# Patient Record
Sex: Male | Born: 1940 | Race: Black or African American | Hispanic: No | Marital: Married | Smoking: Never smoker
Health system: Southern US, Community
[De-identification: ages and names within clinical notes are randomized; demographics above are authoritative.]

## PROBLEM LIST (undated history)

## (undated) DIAGNOSIS — M1990 Unspecified osteoarthritis, unspecified site: Secondary | ICD-10-CM

## (undated) DIAGNOSIS — C189 Malignant neoplasm of colon, unspecified: Secondary | ICD-10-CM

## (undated) DIAGNOSIS — B019 Varicella without complication: Secondary | ICD-10-CM

## (undated) DIAGNOSIS — Z87442 Personal history of urinary calculi: Secondary | ICD-10-CM

## (undated) DIAGNOSIS — D175 Benign lipomatous neoplasm of intra-abdominal organs: Secondary | ICD-10-CM

## (undated) DIAGNOSIS — N200 Calculus of kidney: Secondary | ICD-10-CM

## (undated) DIAGNOSIS — B029 Zoster without complications: Secondary | ICD-10-CM

## (undated) DIAGNOSIS — C801 Malignant (primary) neoplasm, unspecified: Secondary | ICD-10-CM

## (undated) DIAGNOSIS — I10 Essential (primary) hypertension: Secondary | ICD-10-CM

## (undated) DIAGNOSIS — J984 Other disorders of lung: Secondary | ICD-10-CM

## (undated) DIAGNOSIS — D126 Benign neoplasm of colon, unspecified: Secondary | ICD-10-CM

## (undated) DIAGNOSIS — E669 Obesity, unspecified: Secondary | ICD-10-CM

## (undated) HISTORY — PX: KIDNEY STONE SURGERY: SHX686

## (undated) HISTORY — PX: COLON SURGERY: SHX602

## (undated) MED FILL — Dexamethasone Sodium Phosphate Inj 100 MG/10ML: INTRAMUSCULAR | Qty: 1 | Status: AC

## (undated) MED FILL — Fluorouracil IV Soln 2.5 GM/50ML (50 MG/ML): INTRAVENOUS | Qty: 18 | Status: AC

## (undated) MED FILL — Fluorouracil IV Soln 5 GM/100ML (50 MG/ML): INTRAVENOUS | Qty: 100 | Status: AC

---

## 2011-05-20 LAB — URINALYSIS, COMPLETE
Bilirubin,UR: NEGATIVE
Glucose,UR: NEGATIVE mg/dL (ref 0–75)
Ketone: NEGATIVE
Leukocyte Esterase: NEGATIVE
Nitrite: NEGATIVE
Ph: 6 (ref 4.5–8.0)
Protein: NEGATIVE
RBC,UR: 1 /HPF (ref 0–5)
Specific Gravity: 1.021 (ref 1.003–1.030)
Squamous Epithelial: 1
WBC UR: 5 /HPF (ref 0–5)

## 2011-05-20 LAB — CBC
HCT: 37.4 % — ABNORMAL LOW (ref 40.0–52.0)
HGB: 12.4 g/dL — ABNORMAL LOW (ref 13.0–18.0)
MCH: 30.9 pg (ref 26.0–34.0)
MCV: 93 fL (ref 80–100)
RBC: 4.03 10*6/uL — ABNORMAL LOW (ref 4.40–5.90)
WBC: 11.3 10*3/uL — ABNORMAL HIGH (ref 3.8–10.6)

## 2011-05-20 LAB — COMPREHENSIVE METABOLIC PANEL
Albumin: 3.2 g/dL — ABNORMAL LOW (ref 3.4–5.0)
Anion Gap: 11 (ref 7–16)
BUN: 12 mg/dL (ref 7–18)
Bilirubin,Total: 0.3 mg/dL (ref 0.2–1.0)
Calcium, Total: 9.4 mg/dL (ref 8.5–10.1)
Creatinine: 0.91 mg/dL (ref 0.60–1.30)
Glucose: 116 mg/dL — ABNORMAL HIGH (ref 65–99)
Osmolality: 288 (ref 275–301)
Potassium: 4.4 mmol/L (ref 3.5–5.1)
Sodium: 144 mmol/L (ref 136–145)
Total Protein: 8.3 g/dL — ABNORMAL HIGH (ref 6.4–8.2)

## 2011-05-20 LAB — CK TOTAL AND CKMB (NOT AT ARMC): CK-MB: 2.8 ng/mL (ref 0.5–3.6)

## 2011-05-21 ENCOUNTER — Inpatient Hospital Stay: Payer: Self-pay | Admitting: Internal Medicine

## 2011-05-22 LAB — CBC WITH DIFFERENTIAL/PLATELET
Basophil #: 0 10*3/uL (ref 0.0–0.1)
Basophil %: 0.3 %
Eosinophil #: 0.2 10*3/uL (ref 0.0–0.7)
MCH: 30.8 pg (ref 26.0–34.0)
MCHC: 33.2 g/dL (ref 32.0–36.0)
Monocyte #: 0.6 10*3/uL (ref 0.0–0.7)
Neutrophil #: 6.5 10*3/uL (ref 1.4–6.5)
Neutrophil %: 70 %
Platelet: 327 10*3/uL (ref 150–440)
RDW: 12.9 % (ref 11.5–14.5)

## 2011-05-22 LAB — BASIC METABOLIC PANEL
Anion Gap: 5 — ABNORMAL LOW (ref 7–16)
BUN: 8 mg/dL (ref 7–18)
Calcium, Total: 9.3 mg/dL (ref 8.5–10.1)
Chloride: 102 mmol/L (ref 98–107)
Creatinine: 0.85 mg/dL (ref 0.60–1.30)
EGFR (Non-African Amer.): >60
Glucose: 87 mg/dL (ref 65–99)
Potassium: 4.1 mmol/L (ref 3.5–5.1)
Sodium: 138 mmol/L (ref 136–145)

## 2011-05-22 LAB — PROTIME-INR: INR: 1.1

## 2011-05-22 LAB — MAGNESIUM: Magnesium: 2.2 mg/dL

## 2011-05-26 LAB — EXPECTORATED SPUTUM ASSESSMENT W GRAM STAIN, RFLX TO RESP C

## 2011-05-26 LAB — CULTURE, BLOOD (SINGLE)

## 2011-06-23 ENCOUNTER — Ambulatory Visit: Payer: Self-pay | Admitting: Specialist

## 2013-09-22 ENCOUNTER — Ambulatory Visit: Payer: Self-pay | Admitting: Gastroenterology

## 2013-09-25 LAB — PATHOLOGY REPORT

## 2013-10-07 ENCOUNTER — Ambulatory Visit: Payer: Self-pay | Admitting: Surgery

## 2013-10-07 DIAGNOSIS — I10 Essential (primary) hypertension: Secondary | ICD-10-CM

## 2013-10-07 LAB — POTASSIUM: Potassium: 3.3 mmol/L — ABNORMAL LOW (ref 3.5–5.1)

## 2013-10-16 ENCOUNTER — Inpatient Hospital Stay: Payer: Self-pay | Admitting: Surgery

## 2013-10-17 DIAGNOSIS — Z85038 Personal history of other malignant neoplasm of large intestine: Secondary | ICD-10-CM | POA: Insufficient documentation

## 2013-10-17 HISTORY — PX: PARTIAL COLECTOMY: SHX5273

## 2013-10-17 LAB — CBC WITH DIFFERENTIAL/PLATELET
Basophil #: 0 10*3/uL (ref 0.0–0.1)
Basophil %: 0.2 %
EOS ABS: 0 10*3/uL (ref 0.0–0.7)
Eosinophil %: 0 %
HCT: 37.2 % — AB (ref 40.0–52.0)
HGB: 12.3 g/dL — ABNORMAL LOW (ref 13.0–18.0)
LYMPHS ABS: 1 10*3/uL (ref 1.0–3.6)
Lymphocyte %: 6.9 %
MCH: 30.6 pg (ref 26.0–34.0)
MCHC: 32.9 g/dL (ref 32.0–36.0)
MCV: 93 fL (ref 80–100)
Monocyte #: 0.8 x10 3/mm (ref 0.2–1.0)
Monocyte %: 5.9 %
NEUTROS PCT: 87 %
Neutrophil #: 12 10*3/uL — ABNORMAL HIGH (ref 1.4–6.5)
PLATELETS: 231 10*3/uL (ref 150–440)
RBC: 4 10*6/uL — ABNORMAL LOW (ref 4.40–5.90)
RDW: 13 % (ref 11.5–14.5)
WBC: 13.8 10*3/uL — ABNORMAL HIGH (ref 3.8–10.6)

## 2013-10-17 LAB — BASIC METABOLIC PANEL
Anion Gap: 5 — ABNORMAL LOW (ref 7–16)
BUN: 9 mg/dL (ref 7–18)
CALCIUM: 8.6 mg/dL (ref 8.5–10.1)
CHLORIDE: 102 mmol/L (ref 98–107)
Co2: 28 mmol/L (ref 21–32)
Creatinine: 0.94 mg/dL (ref 0.60–1.30)
EGFR (African American): >60
GLUCOSE: 122 mg/dL — AB (ref 65–99)
Osmolality: 270 (ref 275–301)
POTASSIUM: 4 mmol/L (ref 3.5–5.1)
Sodium: 135 mmol/L — ABNORMAL LOW (ref 136–145)

## 2013-10-20 LAB — PLATELET COUNT: Platelet: 240 10*3/uL (ref 150–440)

## 2013-10-20 LAB — PATHOLOGY REPORT

## 2013-11-03 ENCOUNTER — Ambulatory Visit: Payer: Self-pay | Admitting: Hematology and Oncology

## 2013-11-12 ENCOUNTER — Ambulatory Visit: Payer: Self-pay | Admitting: Hematology and Oncology

## 2014-02-05 DIAGNOSIS — M1712 Unilateral primary osteoarthritis, left knee: Secondary | ICD-10-CM | POA: Insufficient documentation

## 2014-04-27 ENCOUNTER — Ambulatory Visit: Payer: Self-pay | Admitting: Hematology and Oncology

## 2014-04-27 LAB — COMPREHENSIVE METABOLIC PANEL
ALBUMIN: 3.3 g/dL — AB (ref 3.4–5.0)
ANION GAP: 7 (ref 7–16)
AST: 25 U/L (ref 15–37)
Alkaline Phosphatase: 69 U/L
BILIRUBIN TOTAL: 0.6 mg/dL (ref 0.2–1.0)
BUN: 16 mg/dL (ref 7–18)
CO2: 31 mmol/L (ref 21–32)
CREATININE: 1.07 mg/dL (ref 0.60–1.30)
Calcium, Total: 9.4 mg/dL (ref 8.5–10.1)
Chloride: 104 mmol/L (ref 98–107)
EGFR (African American): >60
Glucose: 147 mg/dL — ABNORMAL HIGH (ref 65–99)
OSMOLALITY: 287 (ref 275–301)
Potassium: 3.7 mmol/L (ref 3.5–5.1)
SGPT (ALT): 31 U/L
SODIUM: 142 mmol/L (ref 136–145)
Total Protein: 7.6 g/dL (ref 6.4–8.2)

## 2014-04-27 LAB — CBC CANCER CENTER
Basophil #: 0 x10 3/mm (ref 0.0–0.1)
Basophil %: 0.7 %
EOS PCT: 1.1 %
Eosinophil #: 0.1 x10 3/mm (ref 0.0–0.7)
HCT: 40.6 % (ref 40.0–52.0)
HGB: 13.2 g/dL (ref 13.0–18.0)
Lymphocyte #: 2 x10 3/mm (ref 1.0–3.6)
Lymphocyte %: 27.7 %
MCH: 31.1 pg (ref 26.0–34.0)
MCHC: 32.6 g/dL (ref 32.0–36.0)
MCV: 95 fL (ref 80–100)
MONO ABS: 0.4 x10 3/mm (ref 0.2–1.0)
MONOS PCT: 5.9 %
NEUTROS PCT: 64.6 %
Neutrophil #: 4.7 x10 3/mm (ref 1.4–6.5)
Platelet: 208 x10 3/mm (ref 150–440)
RBC: 4.26 10*6/uL — ABNORMAL LOW (ref 4.40–5.90)
RDW: 13 % (ref 11.5–14.5)
WBC: 7.3 x10 3/mm (ref 3.8–10.6)

## 2014-04-28 LAB — CEA: CEA: 3.1 ng/mL (ref 0.0–4.7)

## 2014-05-15 ENCOUNTER — Ambulatory Visit: Payer: Self-pay | Admitting: Hematology and Oncology

## 2014-09-05 NOTE — Op Note (Signed)
PATIENT NAME:  Jared Gutierrez, Jared Gutierrez MR#:  [id number] DATE OF BIRTH:  [birth year]  DATE OF PROCEDURE:  10/16/2013  PREOPERATIVE DIAGNOSIS:  Large polyp of the splenic flexure of the colon.   POSTOPERATIVE DIAGNOSIS:  Large polyp of the transverse colon.   PROCEDURE PERFORMED: Transverse colectomy with mobilization of the splenic flexure.   SURGEON: Rochel Brome, M.D.   ANESTHESIA: General.   INDICATIONS: This 74 year old male recently had a colonoscopy, finding a fungating mass in the region of the splenic flexure. This was marked with tattoo. Surgery was recommended for definitive treatment.   DESCRIPTION OF PROCEDURE: The patient was placed on the operating table in the supine position under general anesthesia. The abdomen was prepared with ChloraPrep and draped in a sterile manner.   An upper abdominal midline incision was made from the xiphoid process to the umbilicus, carried down through subcutaneous tissues. Several small bleeding points were cauterized. The midline fascia was incised and the peritoneum was incised and the abdominal cavity was opened. Initial inspection revealed that there was no palpable mass within the liver. There was a tattoo mark in the transverse colon. Omentum was mobilized. There was a palpable mass in the left transverse colon, which was approximately 4 cm in dimension. Tattoo marks were seen both proximally and distally. This transverse colon was mobilized, separating portions of the omentum away from the colon. Extensive dissection was carried out to mobilize the splenic flexure and spent approximately 45 minutes mobilizing the splenic flexure as there seemed to be a number of adhesions and some redundancy of bowel, and after satisfactory mobilization, the  proximal and distal sites for resection were selected. Approximately 6 inches proximal and distal to the palpable mass, windows were created in the mesentery, and the mesenteric dissection was carried out with the  Harmonic scalpel removing a V-shaped portion of tissue. Dissection was carried out close to the fourth part of the duodenum, mobilizing the transverse mesentery. The middle colic artery and vein were ligated with 0 chromic and then divided with the Harmonic scalpel. The 2 segments of bowel were brought adjacent to one another; each segment was divided with electrocautery and the ends were held up with Allis clamps. The bowel with a cover on the distal end was placed on a side table. Next, the anastomosis was carried out with triangulation technique, first on the posterior wall, which was inverted, held with Allis's and placed with TA 30 stapler. Next, the anastomosis was completed with 2 more applications everting the remainder of the wall of the colon. As these staple line were placed, the anastomosis looked good and was widely patent.   On a side table, the bowel was opened and also placed a stitch on the distal end for the pathologist's orientation. There was a large fungating mass, at least 4 cm or more. There was no hard mass palpated. No adenopathy identified and the specimen was submitted in formalin for routine pathology.   Instruments, towels, gloves, gowns were all changed. Next, the mesenteric defect was closed with running 3-0 chromic. The operative area was examined. Pull suction was used to aspirate a small amount of blood in the left upper quadrant. Hemostasis subsequently appeared to be intact. The midline fascia was closed with interrupted 0 Maxon figure-of-eight sutures. The skin was closed with clips. Dressings were applied with paper tape, and the patient appeared to tolerate the procedure satisfactorily and was prepared for transfer to the recovery room.     ____________________________ Lenna Sciara. Rochel Brome, MD  jws:dmm D: 10/16/2013 14:30:55 ET T: 10/16/2013 19:33:53 ET JOB#: [id number]  cc: Loreli Dollar, MD, <Dictator> Loreli Dollar MD ELECTRONICALLY SIGNED 10/17/2013 21:31

## 2014-09-05 NOTE — Discharge Summary (Signed)
PATIENT NAME:  Jared Gutierrez, Jared Gutierrez MR#:  [id number] DATE OF BIRTH:  Jan 16, 1941  DATE OF ADMISSION:  10/16/2013 DATE OF DISCHARGE:  10/21/2013  HISTORY OF PRESENT ILLNESS: This 74 year old male had a recent screening colonoscopy with findings of a 3 cm fungating mass of the splenic flexure. Pathology demonstrated tubular adenoma with high-grade dysplasia.   Details of past medical history are recorded on the typed [facility]: The patient did have preop bowel preparation at home and came in through the outpatient surgery department. Did have a preoperative prophylactic antibiotic. Had a transverse colectomy. Postoperatively was treated with IV fluids, analgesics, prophylactic heparin.  Began initially a clear liquid diet and later advanced and tolerated his diet satisfactorily.   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 No cancer found in 14 regional mesenteric lymph nodes.   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   OPERATION: Transverse colectomy.   Wound care instructions given. Plans made for followup in the office. Also anticipate oncology consultation.   ____________________________ Lenna Sciara. Rochel Brome, MD jws:dd D: 11/04/2013 18:21:00 ET T: 11/04/2013 21:10:40 ET JOB#: [id number]  cc: Loreli Dollar, MD, <Dictator> Loreli Dollar MD ELECTRONICALLY SIGNED 11/06/2013 18:16

## 2014-09-06 NOTE — Consult Note (Signed)
Impression:  74yo BM w/ h/o pyorrhea admitted with a cavitary lung lesion.  He is generally healthy and has not been hospitalized recently.  He has very few risk factors for TB.   Most likely this is an anaerobic or mixed aerobic/anaerobic infection.   Malignancy is still a possibility, but less likely. Agree with ruling out TB.  Will await sputum AFB smears. Continue zosyn.  Will d/c azithromycin.  Atypical organisms would not likely cause cavitation. If he improves, would change him to augmentin. Will need follow up CT of the chest if 1-2 months to document improvement.  Electronic Signatures: Keighan Amezcua, Heinz Knuckles (MD)  (Signed on 07-Jan-13 12:47)  Authored  Last Updated: 07-Jan-13 12:47 by Stony Stegmann, Heinz Knuckles (MD)

## 2014-09-06 NOTE — H&P (Signed)
PATIENT NAME:  Jared Gutierrez, Jared Gutierrez MR#:  [id number] DATE OF BIRTH:  [birth year]  DATE OF ADMISSION:  05/21/2011  REFERRING PHYSICIAN: Dr. Rip Harbour PRIMARY CARE PHYSICIAN: None   PRESENTING COMPLAINT: Cough, wheezing, right side chest pain, chills.   HISTORY OF PRESENT ILLNESS: Jared Gutierrez is a pleasant 74 year old gentleman with history of pituitary gland surgery, nephrolithiasis, who presents with complaints of developing cough six days ago as well as chills followed by wheezing and then developed right-sided chest pain with cough and breathing. He reports that intermittently productive with brownish-yellow phlegm. He denies any hemoptysis. He denies any shortness of breath. He denies radiation of the pain. No history of nausea and vomiting. Denies any travels outside of the [place]. He was not in the [employer]. No exposure to homeless shelters or prisons. No exposure to anyone infected with tuberculosis as far as he knows. 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 Denies any tobacco or alcohol abuse.   PAST MEDICAL HISTORY: Nephrolithiasis status post surgical extraction.   PAST SURGICAL HISTORY:  1. Pituitary gland surgery.  2. Stone extraction as above.   ALLERGIES: No known drug allergies.   MEDICATIONS:  1. He has been taking Advil as needed for his cold symptoms.  2. Multivitamin daily.   FAMILY HISTORY: Mother died of breast cancer. Father had lung cancer, he was a smoker.   SOCIAL HISTORY: He lives in [place] with his wife. Denies any tobacco. He socially drinks. No drug use or IV drug use. He is a Dealer, has his own lawn care service.   REVIEW OF SYSTEMS: CONSTITUTIONAL: Denies any fevers. He endorses chills. No nausea or vomiting. EYES: No inflammation, glaucoma, cataracts. ENT: No ear pain, epistaxis, discharge. RESPIRATORY: As per history of present illness. CARDIOVASCULAR: Chest pain on the right side as per history of present illness. No orthopnea. Denies any changes in  lower extremity swelling. No palpitations or syncope. GASTROINTESTINAL: No nausea, vomiting, diarrhea, abdominal pain, hematemesis, or melena. Denies any dyspepsia or dysphagia. GENITOURINARY: No dysuria or hematuria. ENDO: No polyuria or polydipsia. HEMATOLOGIC: No easy bleeding. SKIN: No rash or ulcers. MUSCULOSKELETAL: He has occasional knee pain bilaterally. NEUROLOGIC: No one-sided weakness or numbness. PSYCH: Denies any depression or suicidal ideation.   PHYSICAL EXAMINATION:  VITAL SIGNS: Temperature 98.2, pulse 87, respiratory rate 20, blood pressure 177/77, sating at 96% on room air.   GENERAL: Lying in bed in no apparent distress.   HEENT: Normocephalic, atraumatic. Pupils equal, symmetric. Nasal cannula in place. He has poor dentition with dental caries on the posterior molars. No ulcers. No oral thrush.   NECK: Soft and supple. No adenopathy or JVP.   CARDIOVASCULAR: Non-tachy. No murmurs, rubs, or gallops.   LUNGS: Basilar crackles. No use of accessory muscles or increased respiratory effort. He has distant breath sounds.   ABDOMEN: Obese. Positive bowel sounds. No mass appreciated.   EXTREMITIES: Trace edema bilaterally. Dorsal pedis pulses intact.   MUSCULOSKELETAL: No joint effusion.   SKIN: No ulcers or lesions.   NEUROLOGIC: No asterixis. Symmetrical strength. No focal deficits.   PSYCH: He is alert and oriented. Patient is cooperative.   LABORATORY, DIAGNOSTIC AND RADIOLOGICAL DATA: CT chest for PE with contrast shows no evidence of PE. There is trace pleural effusions. There is a cavitary lesion in the right lower lobe measuring 5.8 x 4.7 cm with an air-fluid level in the cavitary region. Infectious, inflammatory or malignant changes could cause this appearance. There is bilateral lower lobe areas  of presumed atelectasis or minimal infiltrate. Bullous changes are seen in the right middle lobe inferiorly and anteriorly in the right lung apex anteriorly otherwise discrete  pulmonary mass is not seen. No focal endobronchial lesion. Urinalysis with specific gravity of 1.021, pH 6, RBC 1 per high-power field, WBC 5 per high-power field. Chest x-ray actually read as no acute findings. CK 163, MB 2.8. Troponin less than 0.02. D-dimer 1.5, glucose 116, BUN 12, creatinine 0.91, sodium 144, potassium 4.4, chloride 104, carbon dioxide 29, calcium 9.4. LFTs within normal limits. Albumin 3.2. WBC 11.3, hemoglobin 12.4, hematocrit 37.4, platelets 304, MCV 93. EKG with normal sinus rate of 88 and some PVCs. No ST changes.   ASSESSMENT AND PLAN: Mr. Corpening is a pleasant 74 year old gentleman with history of nephrolithiasis and pituitary gland surgery who presents with cough, right side pleuritic chest pain, wheezing and chills.  1. Right lower lobe cavitary lesion more likely aspiration pneumonia with dental caries. Patient is not high risk but will send HIV and rule out tuberculosis. He is a nonsmoker and CT does not report any primary mass lesion. His blood cultures have been sent. Will send sputum culture. Start on clindamycin for now. Continue oxygen and SVNs as needed. Get speech evaluation. Will also obtain ID and pulmonary consultation.  2. Hypertension. No prior treatment or diagnosis, possibly pain related. Will continue to follow for now.  3. Prophylaxis on TEDs and SCDs and Protonix.   TIME SPENT: Approximately 45 minutes spent on patient care.    ____________________________ Rita Ohara, MD ap:cms D: 05/21/2011 00:57:07 ET T: 05/21/2011 10:42:52 ET JOB#: [id number]  cc: Jared Gutierrez Swallows, MD, <Dictator> Rita Ohara MD ELECTRONICALLY SIGNED 06/03/2011 2:26

## 2014-09-06 NOTE — Discharge Summary (Signed)
PATIENT NAME:  Jared Gutierrez, BEVILL MR#:  [id number] DATE OF BIRTH:  1940/08/09  DATE OF ADMISSION:  05/21/2011 DATE OF DISCHARGE:  05/25/2011  DISCHARGE DIAGNOSES:   1. Right lower lobe cavitary lesion.  2. Hypertension.  3. Nephrolithiasis.   DISCHARGE MEDICATIONS: Augmentin 875 mg twice a day.   REASON FOR ADMISSION: This is a 74 year old male who comes in with complaints of cough for six days, right-sided chest pain, and shortness of breath. Chest x-ray showed him to have a right cavitated lesion. He denies any exposure to tuberculosis.   HOSPITAL COURSE:  Right lower lobe cavitary lesion. CT showed a cavitary lesion. He was placed on respiratory isolation. AFB smears were ordered. ID was consulted. He was started on antibiotics awaiting AFB smears. He eventually had three AFB smears that were negative. He was changed over from IV Zosyn to p.o. Augmentin. He will complete a 30 day course and follow-up with Dr. Clayborn Bigness for repeat CT scan.   TIME SPENT: Time spent on discharge was 32 minutes.  ____________________________ Baxter Hire, MD jdj:rbg D: 05/25/2011 11:30:16 ET T: 05/25/2011 16:37:56 ET JOB#: [id number]  cc: Baxter Hire, MD, <Dictator> Woodland Blocker, MD Baxter Hire MD ELECTRONICALLY SIGNED 05/28/2011 20:00

## 2014-09-06 NOTE — Consult Note (Signed)
PATIENT NAME:  Jared Gutierrez, Jared Gutierrez MR#:  [id number] DATE OF BIRTH:  1940-11-18  DATE OF CONSULTATION:  05/22/2011  REFERRING PHYSICIAN:  Dr. Leslye Peer  CONSULTING PHYSICIAN:  Heinz Knuckles. Herberta Pickron, MD  REASON FOR CONSULTATION: Cavitary lung lesion.   HISTORY OF PRESENT ILLNESS: Patient is a 74 year old black man with a past history significant for pituitary gland surgery and pyorrhea who was admitted on 05/21/2011 with approximately one-week history of cough, sputum production, low-grade fever and chills. He developed chest pains on the right side in the day or so prior to admission. He has not had significant weight loss. He has not had night sweats. He does not have any hemoptysis. He denies any known tuberculosis contacts. He has never been in prison and has never been homeless. Patient was hospital his chest x-ray was negative, however, CT scan was obtained due to the possible risk of a pulmonary embolism. No embolism was seen but he does have a right lower lobe cavitary lesion.   ALLERGIES: None.   PAST MEDICAL HISTORY:  1. Pituitary surgery.  2. Nephrolithiasis.  3. Pyorrhea.  4. He has not had any recent dental work.   SOCIAL HISTORY: Patient lives with his wife. He is not a smoker. Occasional alcohol use. No history of injecting drug use. He has not had any travel outside of the [employer]. recently.   FAMILY HISTORY: Positive for breast cancer, lung cancer and chronic obstructive pulmonary disease.    REVIEW OF SYSTEMS: GENERAL: Some low-grade fevers. Positive chills. No night sweats. No weight loss. HEENT: No headache. Minor sinus congestion and nasal congestion. No sore throat. No hoarseness. NECK: No stiffness. No swollen glands. RESPIRATORY: Positive cough with occasional productive sputum. Some shortness of breath and wheezing. No hemoptysis. CARDIAC: Chest pains on the right side. They are not related to exertion. No peripheral edema. GASTROINTESTINAL: No nausea, no vomiting, no  abdominal pain, no change in his bowels. GENITOURINARY: No change in his urine other than some urgency when the chest pain began. He has had no dysuria. No hematuria. MUSCULOSKELETAL: He has some generalized muscle aches but no frank joint swelling or inflammation. SKIN: No rashes. NEUROLOGIC: No focal weaknesses. PSYCHIATRIC: No complaints. All other systems are negative.   PHYSICAL EXAMINATION:  VITAL SIGNS: T-max 99.3, pulse 63, blood pressure 126/61, 94% on room air.   GENERAL: 74 year old black man in no acute distress.   HEENT: Normocephalic, atraumatic. Pupils equal, reactive to light. Extraocular motion intact. Sclerae, conjunctivae, and lids are without evidence for emboli or petechiae. Oropharynx shows no erythema or exudate. Teeth and gums are in fair condition. There is no evidence for gingival infection at this time.   NECK: Supple. Full range of motion. Midline trachea. No lymphadenopathy. No thyromegaly.   CHEST: Clear to auscultation bilaterally with good air movement. No focal consolidation.   CARDIAC: Regular rate and rhythm without murmur, rub, or gallop. No peripheral cyanosis, clubbing or edema.   ABDOMEN: Soft, nontender, nondistended. No hepatosplenomegaly. No hernias noted.   EXTREMITIES: No evidence for tenosynovitis.   SKIN: No rashes. No stigmata of endocarditis, specifically no Janeway lesions nor Osler nodes.   NEUROLOGIC: The patient was awake and interactive, moving all four extremities.   PSYCHIATRIC: Mood and affect appeared normal.   LABORATORY, DIAGNOSTIC, AND RADIOLOGICAL DATA: BUN 8, creatinine 0.85, bicarbonate 31, anion gap 5e. LFTs were unremarkable except for total protein 8.2, albumin 3.2. TSH 2.77. White count 9.2, hemoglobin 11.8, platelet count 327, ANC 6.5. White count on admission  11.3. Blood cultures from admission show no growth. AFB sputums are pending x2 and a routine sputum culture is pending x1. Urinalysis was unremarkable. Chest x-ray  showed no acute cardiopulmonary disease. A CT scan of the chest showed no evidence for pulmonary emboli. There were multifocal areas of atelectasis or infiltrate, a cavitary mass in the right lower lobe.   IMPRESSION: 74 year old black man with a history of pyorrhea admitted with cavitary lung lesion.   RECOMMENDATIONS:  1. He is generally healthy and has not been hospitalized recently. He has very few risk factors for tuberculosis. Most likely this is an anaerobic or mixed aerobic anaerobic infection. Malignancy is still a possibility but less likely.  2. Agree with ruling out tuberculosis. Will await sputum AFB smears.  3. Will continue Zosyn. Will discontinue the azithromycin. Atypical organisms would not likely cause cavitation.  4. if he improves would change him to Augmentin.  5. He will need a CT scan of the chest in 1 to 2 months to document clearance. If he does not show clearance then further evaluation with bronchoscopy or CT-guided biopsy may be necessary.   This is a moderately complex infectious disease consult. Thank you very much for involving me in Mr. Furney care.  ____________________________ Heinz Knuckles. Horice Carrero, MD meb:cms D: 05/22/2011 12:54:06 ET T: 05/22/2011 14:00:09 ET JOB#: [id number]  cc: Heinz Knuckles. Arlita Buffkin, MD, <Dictator> Kaelem Brach E Arna Luis MD ELECTRONICALLY SIGNED 05/23/2011 8:44

## 2014-10-01 ENCOUNTER — Encounter: Payer: Self-pay | Admitting: *Deleted

## 2014-10-02 ENCOUNTER — Ambulatory Visit
Admission: RE | Admit: 2014-10-02 | Discharge: 2014-10-02 | Disposition: A | Discharge status: Home / Self Care | Payer: Medicare Other | Source: Ambulatory Visit | Attending: Gastroenterology | Admitting: Gastroenterology

## 2014-10-02 ENCOUNTER — Encounter
Admission: RE | Disposition: A | Discharge status: Home / Self Care | Payer: Self-pay | Source: Ambulatory Visit | Attending: Gastroenterology

## 2014-10-02 ENCOUNTER — Ambulatory Visit: Payer: Medicare Other | Admitting: Anesthesiology

## 2014-10-02 DIAGNOSIS — Z87442 Personal history of urinary calculi: Secondary | ICD-10-CM | POA: Insufficient documentation

## 2014-10-02 DIAGNOSIS — Z79899 Other long term (current) drug therapy: Secondary | ICD-10-CM | POA: Diagnosis not present

## 2014-10-02 DIAGNOSIS — I10 Essential (primary) hypertension: Secondary | ICD-10-CM | POA: Insufficient documentation

## 2014-10-02 DIAGNOSIS — D120 Benign neoplasm of cecum: Secondary | ICD-10-CM | POA: Diagnosis not present

## 2014-10-02 DIAGNOSIS — D123 Benign neoplasm of transverse colon: Secondary | ICD-10-CM | POA: Diagnosis not present

## 2014-10-02 DIAGNOSIS — Z85038 Personal history of other malignant neoplasm of large intestine: Secondary | ICD-10-CM | POA: Diagnosis present

## 2014-10-02 DIAGNOSIS — M1990 Unspecified osteoarthritis, unspecified site: Secondary | ICD-10-CM | POA: Diagnosis not present

## 2014-10-02 DIAGNOSIS — D175 Benign lipomatous neoplasm of intra-abdominal organs: Secondary | ICD-10-CM | POA: Diagnosis not present

## 2014-10-02 DIAGNOSIS — Z08 Encounter for follow-up examination after completed treatment for malignant neoplasm: Secondary | ICD-10-CM | POA: Diagnosis not present

## 2014-10-02 DIAGNOSIS — Z7982 Long term (current) use of aspirin: Secondary | ICD-10-CM | POA: Diagnosis not present

## 2014-10-02 HISTORY — DX: Other disorders of lung: J98.4

## 2014-10-02 HISTORY — DX: Essential (primary) hypertension: I10

## 2014-10-02 HISTORY — PX: COLONOSCOPY: SHX5424

## 2014-10-02 HISTORY — DX: Calculus of kidney: N20.0

## 2014-10-02 HISTORY — DX: Malignant neoplasm of colon, unspecified: C18.9

## 2014-10-02 HISTORY — DX: Malignant (primary) neoplasm, unspecified: C80.1

## 2014-10-02 HISTORY — DX: Unspecified osteoarthritis, unspecified site: M19.90

## 2014-10-02 SURGERY — COLONOSCOPY
Anesthesia: General

## 2014-10-02 MED ORDER — FENTANYL CITRATE (PF) 100 MCG/2ML IJ SOLN
INTRAMUSCULAR | Status: DC | PRN
  Administered 2014-10-02: 50 ug via INTRAVENOUS

## 2014-10-02 MED ORDER — PROPOFOL 10 MG/ML IV BOLUS
INTRAVENOUS | Status: DC | PRN
  Administered 2014-10-02: 20 mg via INTRAVENOUS

## 2014-10-02 MED ORDER — PROPOFOL INFUSION 10 MG/ML OPTIME
INTRAVENOUS | Status: DC | PRN
  Administered 2014-10-02: 100 ug/kg/min via INTRAVENOUS

## 2014-10-02 MED ORDER — SODIUM CHLORIDE 0.9 % IV SOLN
INTRAVENOUS | Status: DC
  Administered 2014-10-02: 1000 mL via INTRAVENOUS
  Administered 2014-10-02: 10:00:00 via INTRAVENOUS

## 2014-10-02 NOTE — Discharge Instructions (Signed)

## 2014-10-02 NOTE — H&P (Signed)
  Primary Care Physician:  Tracie Harrier, MD  Pre-Procedure History & Physical: HPI:  Jared Gutierrez is a 74 y.o. male is here for an colonoscopy.   Past Medical History  Diagnosis Date  . Hypertension   . Cancer   . Cavitary lesion of lung   . Nephrolithiasis   . Colon cancer   . Arthritis     History reviewed. No pertinent past surgical history.  Prior to Admission medications   Medication Sig Start Date End Date Taking? Authorizing Provider  aspirin 81 MG tablet Take 81 mg by mouth daily.   Yes Historical Provider, MD  glucosamine-chondroitin 500-400 MG tablet Take 1 tablet by mouth daily.   Yes Historical Provider, MD  losartan-hydrochlorothiazide (HYZAAR) 50-12.5 MG per tablet Take 1 tablet by mouth daily.   Yes Historical Provider, MD  oxybutynin (DITROPAN) 5 MG tablet Take 5 mg by mouth 3 (three) times daily.   Yes Historical Provider, MD  Probiotic Product (PROBIOTIC DAILY PO) Take 1 tablet by mouth daily.   Yes Historical Provider, MD    Allergies as of 09/04/2014  . (Not on File)    History reviewed. No pertinent family history.  History   Social History  . Marital Status: Married    Spouse Name: N/A  . Number of Children: N/A  . Years of Education: N/A   Occupational History  . Not on file.   Social History Main Topics  . Smoking status: Never Smoker   . Smokeless tobacco: Never Used  . Alcohol Use: No  . Drug Use: No  . Sexual Activity: Not on file   Other Topics Concern  . Not on file   Social History Narrative  . No narrative on file     Physical Exam: BP 146/65 mmHg  Pulse 59  Temp(Src) 96.8 F (36 C) (Tympanic)  Resp 18  Ht 5\' 9"  (1.753 m)  Wt 260 lb (117.935 kg)  BMI 38.38 kg/m2  SpO2 100% General:   Alert,  pleasant and cooperative in NAD Head:  Normocephalic and atraumatic. Neck:  Supple; no masses or thyromegaly. Lungs:  Clear throughout to auscultation.    Heart:  Regular rate and rhythm. Abdomen:  Soft, nontender and  nondistended. Normal bowel sounds, without guarding, and without rebound.   Neurologic:  Alert and  oriented x4;  grossly normal neurologically.  Impression/Plan: Jared Gutierrez is here for an colonoscopy to be performed for colon cancer surveillance  Risks, benefits, limitations, and alternatives regarding  colonoscopy have been reviewed with the patient.  Questions have been answered.  All parties agreeable.   Josefine Class, MD  10/02/2014, 9:59 AM

## 2014-10-02 NOTE — Anesthesia Postprocedure Evaluation (Signed)
  Anesthesia Post-op Note  Patient: Jared Gutierrez  Procedure(s) Performed: Procedure(s): COLONOSCOPY (N/A)  Anesthesia type:General  Patient location: PACU  Post pain: Pain level controlled  Post assessment: Post-op Vital signs reviewed, Patient's Cardiovascular Status Stable, Respiratory Function Stable, Patent Airway and No signs of Nausea or vomiting  Post vital signs: Reviewed and stable  Last Vitals:  Filed Vitals:   10/02/14 1110  BP: 134/74  Pulse: 54  Temp:   Resp: 11    Level of consciousness: awake, alert  and patient cooperative  Complications: No apparent anesthesia complications

## 2014-10-02 NOTE — Transfer of Care (Signed)
Immediate Anesthesia Transfer of Care Note  Patient: Jared Gutierrez  Procedure(s) Performed: Procedure(s): COLONOSCOPY (N/A)  Patient Location: PACU  Anesthesia Type:General  Level of Consciousness: awake, alert  and oriented  Airway & Oxygen Therapy: Patient Spontanous Breathing and Patient connected to nasal cannula oxygen  Post-op Assessment: Report given to RN  Post vital signs: Reviewed and stable  Last Vitals:  Filed Vitals:   10/02/14 1041  BP: 116/62  Pulse: 52  Temp: 36.2 C  Resp:     Complications: No apparent anesthesia complications

## 2014-10-02 NOTE — Anesthesia Preprocedure Evaluation (Signed)
Anesthesia Evaluation  Patient identified by MRN, date of birth, ID band Patient awake    Reviewed: Allergy & Precautions, NPO status , Patient's Chart, lab work & pertinent test results  Airway Mallampati: III       Dental  (+) Partial Lower, Partial Upper, Chipped,    Pulmonary  Cavitary lesion of the lung         Cardiovascular hypertension, Pt. on medications     Neuro/Psych    GI/Hepatic   Endo/Other    Renal/GU Renal disease (Stones)     Musculoskeletal   Abdominal   Peds  Hematology   Anesthesia Other Findings   Reproductive/Obstetrics                             Anesthesia Physical Anesthesia Plan  ASA: III  Anesthesia Plan: General   Post-op Pain Management:    Induction: Intravenous  Airway Management Planned: Nasal Cannula  Additional Equipment:   Intra-op Plan:   Post-operative Plan:   Informed Consent: I have reviewed the patients History and Physical, chart, labs and discussed the procedure including the risks, benefits and alternatives for the proposed anesthesia with the patient or authorized representative who has indicated his/her understanding and acceptance.     Plan Discussed with:   Anesthesia Plan Comments:         Anesthesia Quick Evaluation

## 2014-10-02 NOTE — Op Note (Signed)
[facility] Gastroenterology Patient Name: Jared Gutierrez Procedure Date: 10/02/2014 10:01 AM MRN: [id number] Account #: [id number] Date of Birth: 1941/04/30 Admit Type: Outpatient Age: 74 Room: [facility] ENDO ROOM 1 Gender: Male Note Status: Finalized Procedure:         Colonoscopy Indications:       High risk colon cancer surveillance: Personal history of                     colon cancer 09/2013, Last colonoscopy 1 year ago Patient Profile:   This is a 74 year old male. Providers:         Gerrit Heck. Rayann Heman, MD Referring MD:      Tracie Harrier, MD (Referring MD) Medicines:         Propofol per Anesthesia Complications:     No immediate complications. Procedure:         Pre-Anesthesia Assessment:                    - Prior to the procedure, a History and Physical was                     performed, and patient medications and allergies were                     reviewed. The patient is competent. The risks and benefits                     of the procedure and the sedation options and risks were                     discussed with the patient. All questions were answered                     and informed consent was obtained. Patient identification                     and proposed procedure were verified by the physician and                     the nurse in the pre-procedure area. Mental Status                     Examination: alert and oriented. Airway Examination:                     normal oropharyngeal airway and neck mobility. Respiratory                     Examination: clear to auscultation. CV Examination: RRR,                     no murmurs, no S3 or S4. Prophylactic Antibiotics: The                     patient does not require prophylactic antibiotics. Prior                     Anticoagulants: The patient has taken aspirin, last dose                     was 1 day prior to procedure. ASA Grade Assessment: II - A  patient with mild systemic disease.  After reviewing the                     risks and benefits, the patient was deemed in satisfactory                     condition to undergo the procedure. The anesthesia plan                     was to use monitored anesthesia care (MAC). Immediately                     prior to administration of medications, the patient was                     re-assessed for adequacy to receive sedatives. The heart                     rate, respiratory rate, oxygen saturations, blood                     pressure, adequacy of pulmonary ventilation, and response                     to care were monitored throughout the procedure. The                     physical status of the patient was re-assessed after the                     procedure.                    After obtaining informed consent, the colonoscope was                     passed under direct vision. Throughout the procedure, the                     patient's blood pressure, pulse, and oxygen saturations                     were monitored continuously. The Colonoscope was                     introduced through the anus and advanced to the the cecum,                     identified by appendiceal orifice and ileocecal valve. The                     colonoscopy was performed without difficulty. The patient                     tolerated the procedure well. The quality of the bowel                     preparation was excellent. Findings:      The perianal and digital rectal examinations were normal.      A 8 mm polyp was found in the cecum. The polyp was sessile. The polyp       was removed with a hot snare. Resection and retrieval were complete.      A 3 mm polyp was found in the cecum. The polyp was sessile. The polyp  was removed with a jumbo cold forceps. Resection and retrieval were       complete.      A 3 mm polyp was found in the transverse colon. The polyp was sessile.       The polyp was removed with a jumbo cold forceps. Resection and  retrieval       were complete.      A 3 mm polyp was found in the transverse colon. The polyp was sessile.       The polyp was removed with a jumbo cold forceps. The polyp was removed       with a cold snare. Resection and retrieval were complete.      There was a medium-sized lipoma, 10 mm in diameter, in the transverse       colon.      The exam was otherwise without abnormality. Impression:        - One 8 mm polyp in the cecum. Resected and retrieved.                    - One 3 mm polyp in the cecum. Resected and retrieved.                    - One 3 mm polyp in the transverse colon. Resected and                     retrieved.                    - One 3 mm polyp in the transverse colon. Resected and                     retrieved.                    - Medium-sized lipoma in the transverse colon.                    - The examination was otherwise normal. Recommendation:    - Observe patient in GI recovery unit.                    - Continue present medications.                    - Await pathology results.                    - Repeat colonoscopy in 2 years for surveillance.                    - Return to referring physician.                    - The findings and recommendations were discussed with the                     patient.                    - The findings and recommendations were discussed with the                     patient's family. Procedure Code(s): --- Professional ---                    [phone], Colonoscopy, flexible; with removal of tumor(s),  polyp(s), or other lesion(s) by snare technique                    [id number], 59, Colonoscopy, flexible; with biopsy, single or                     multiple CPT copyright 2014 American Medical Association. All rights reserved. The codes documented in this report are preliminary and upon coder review may  be revised to meet current compliance requirements. Mellody Life, MD 10/02/2014 10:38:06 AM This report has  been signed electronically. Number of Addenda: 0 Note Initiated On: 10/02/2014 10:01 AM Scope Withdrawal Time: 0 hours 16 minutes 41 seconds  Total Procedure Duration: 0 hours 23 minutes 28 seconds       [facility]

## 2014-10-05 LAB — SURGICAL PATHOLOGY

## 2014-10-06 ENCOUNTER — Encounter: Payer: Self-pay | Admitting: Gastroenterology

## 2014-11-04 ENCOUNTER — Other Ambulatory Visit: Payer: Medicare Other

## 2014-11-04 ENCOUNTER — Ambulatory Visit: Payer: Medicare Other | Admitting: Hematology and Oncology

## 2014-11-09 ENCOUNTER — Other Ambulatory Visit: Payer: Self-pay

## 2014-11-09 DIAGNOSIS — C189 Malignant neoplasm of colon, unspecified: Secondary | ICD-10-CM

## 2014-11-11 ENCOUNTER — Inpatient Hospital Stay: Payer: Medicare Other | Attending: Hematology and Oncology

## 2014-11-11 ENCOUNTER — Inpatient Hospital Stay (HOSPITAL_BASED_OUTPATIENT_CLINIC_OR_DEPARTMENT_OTHER): Payer: Medicare Other | Admitting: Hematology and Oncology

## 2014-11-11 VITALS — BP 131/61 | HR 68 | Temp 98.1°F | Wt 267.0 lb

## 2014-11-11 DIAGNOSIS — D123 Benign neoplasm of transverse colon: Secondary | ICD-10-CM | POA: Diagnosis not present

## 2014-11-11 DIAGNOSIS — I10 Essential (primary) hypertension: Secondary | ICD-10-CM | POA: Insufficient documentation

## 2014-11-11 DIAGNOSIS — C184 Malignant neoplasm of transverse colon: Secondary | ICD-10-CM | POA: Diagnosis not present

## 2014-11-11 DIAGNOSIS — C189 Malignant neoplasm of colon, unspecified: Secondary | ICD-10-CM

## 2014-11-11 DIAGNOSIS — Z79899 Other long term (current) drug therapy: Secondary | ICD-10-CM

## 2014-11-11 DIAGNOSIS — Z7982 Long term (current) use of aspirin: Secondary | ICD-10-CM | POA: Insufficient documentation

## 2014-11-11 DIAGNOSIS — Z9049 Acquired absence of other specified parts of digestive tract: Secondary | ICD-10-CM

## 2014-11-11 DIAGNOSIS — D120 Benign neoplasm of cecum: Secondary | ICD-10-CM | POA: Insufficient documentation

## 2014-11-11 LAB — COMPREHENSIVE METABOLIC PANEL
ALT: 26 U/L (ref 17–63)
AST: 44 U/L — ABNORMAL HIGH (ref 15–41)
Albumin: 4 g/dL (ref 3.5–5.0)
Alkaline Phosphatase: 61 U/L (ref 38–126)
Anion gap: 8 (ref 5–15)
BUN: 17 mg/dL (ref 6–20)
CO2: 28 mmol/L (ref 22–32)
Calcium: 9.4 mg/dL (ref 8.9–10.3)
Chloride: 101 mmol/L (ref 101–111)
Creatinine, Ser: 0.94 mg/dL (ref 0.61–1.24)
GFR calc Af Amer: >60 mL/min (ref >60)
GFR calc non Af Amer: >60 mL/min (ref >60)
Glucose, Bld: 107 mg/dL — ABNORMAL HIGH (ref 65–99)
Potassium: 3.7 mmol/L (ref 3.5–5.1)
Sodium: 137 mmol/L (ref 135–145)
Total Bilirubin: 0.9 mg/dL (ref 0.3–1.2)
Total Protein: 7.4 g/dL (ref 6.5–8.1)

## 2014-11-11 LAB — CBC WITH DIFFERENTIAL/PLATELET
Basophils Absolute: 0.2 10*3/uL — ABNORMAL HIGH (ref 0–0.1)
Basophils Relative: 2 %
Eosinophils Absolute: 0.1 10*3/uL (ref 0–0.7)
Eosinophils Relative: 1 %
HCT: 39.7 % — ABNORMAL LOW (ref 40.0–52.0)
Hemoglobin: 13.4 g/dL (ref 13.0–18.0)
Lymphocytes Relative: 24 %
Lymphs Abs: 2 10*3/uL (ref 1.0–3.6)
MCH: 31 pg (ref 26.0–34.0)
MCHC: 33.8 g/dL (ref 32.0–36.0)
MCV: 91.9 fL (ref 80.0–100.0)
Monocytes Absolute: 0.7 10*3/uL (ref 0.2–1.0)
Monocytes Relative: 8 %
Neutro Abs: 5.4 10*3/uL (ref 1.4–6.5)
Neutrophils Relative %: 65 %
Platelets: 212 10*3/uL (ref 150–440)
RBC: 4.32 MIL/uL — ABNORMAL LOW (ref 4.40–5.90)
RDW: 12.8 % (ref 11.5–14.5)
WBC: 8.4 10*3/uL (ref 3.8–10.6)

## 2014-11-11 NOTE — Progress Notes (Signed)
Ongoing eval of colon cancer. Had a colonoscopy in may 2016. Found polyps. Will repeat colonoscopy in 2 years. 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 Appetite is good. Bowels/bladder normal.No abd pain. No blood is seen in stools. Energy is rated as fair.

## 2014-11-11 NOTE — Progress Notes (Signed)
[facility] day:  11/11/2014  Chief Complaint: Jared Gutierrez is a 75 y.o. male with stage I colon cancer who is seen for reassessment.  HPI: The patient states that his colon cancer was diagnosed with his first screening colonoscopy By Dr. Arther Dames.  He was noted to have a 3 cm fungating mass at the splenic flexure. He denied any symptoms including pain, hematochezia or melena.  He underwent a transverse colectomy on 10/17/2013.  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 Tumor size was 1 cm (invasive component) and moderately differentiated. Tumor extended into the submucosa. Margins were negative. There was lymphovascular invasion. 14 lymph nodes were negative. Pathologic stage was T1 N0.  He underwent a follow-up colonoscopy on 10/02/2014.  He was noted to have a 3 mm and a 8 mm polyp in the cecum, two 3 mm polyps in the transverse colon, and a medium-sized lipoma in the transverse colon. Polyps were resected.  Pathology of the cecal and transverse colon polyps revealed tubular adenomas negative for high-grade dysplasia and malignancy. Plan is for follow-up colonoscopy in 2 years for surveillance.  Symptomatically, he states he feels "pretty good". He has some knee problems. He denies any melena or hematochezia.  Past Medical History  Diagnosis Date  . Hypertension   . Cancer   . Cavitary lesion of lung   . Nephrolithiasis   . Colon cancer   . Arthritis     Past Surgical History  Procedure Laterality Date  . Colonoscopy N/A 10/02/2014    Procedure: COLONOSCOPY;  Surgeon: Josefine Class, MD;  Location: [facility] ENDOSCOPY;  Service: Endoscopy;  Laterality: N/A;    No family history on file.  Social History:  reports that he has never smoked. He has never used smokeless tobacco. He reports that he does not drink alcohol or use illicit drugs.  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  The patient is accompanied by his wife today.  Allergies: No Known Allergies  Current Medications: Current Outpatient Prescriptions  Medication Sig Dispense Refill  . aspirin 81 MG tablet Take 81 mg by mouth daily.    [name] glucosamine-chondroitin 500-400 MG tablet Take 1 tablet by mouth daily.    [name] losartan-hydrochlorothiazide (HYZAAR) 50-12.5 MG per tablet Take 1 tablet by mouth daily.    [name] oxybutynin (DITROPAN) 5 MG tablet Take 5 mg by mouth 3 (three) times daily.    . Probiotic Product (PROBIOTIC DAILY PO) Take 1 tablet by mouth daily.     No current facility-administered medications for this visit.    Review of Systems:  GENERAL:  Feels pretty good.  Active.  No fevers or sweats.  Weight gain since surgery. PERFORMANCE STATUS (ECOG):  0 HEENT:  No visual changes, runny nose, sore throat, mouth sores or tenderness. Lungs: No shortness of breath or cough.  No hemoptysis. Cardiac:  No chest pain, palpitations, orthopnea, or PND. GI:  No nausea, vomiting, diarrhea, constipation, melena or hematochezia. GU:  No urgency, frequency, dysuria, or hematuria. Musculoskeletal:  No back pain.  No joint pain.  No muscle tenderness. Extremities:  Knee problems.  No swelling. Skin:  No rashes or skin changes. Neuro:  No headache, numbness or weakness, balance or coordination issues. Endocrine:  No diabetes, thyroid issues, hot flashes or night sweats. Psych:  No mood changes, depression or anxiety. Pain:  No focal  pain. Review of systems:  All other systems reviewed and found to be negative.   Physical Exam: Blood pressure 131/61, pulse 68, temperature 98.1 F (36.7 C), temperature source Tympanic, weight 267 lb (121.11 kg). GENERAL:  Well developed, well nourished, sitting comfortably in the exam room in no acute distress. MENTAL STATUS:  Alert and oriented to person, place and time. HEAD:  Pearline Cables hair with beard.  Male pattern baldness.   Normocephalic, atraumatic, face symmetric, no Cushingoid features. EYES:  Pupils equal round and reactive to light and accomodation.  No conjunctivitis or scleral icterus. ENT:  Oropharynx clear without lesion.  Dentures.  Tongue normal. Mucous membranes moist.  RESPIRATORY:  Clear to auscultation without rales, wheezes or rhonchi. CARDIOVASCULAR:  Regular rate and rhythm without murmur, rub or gallop. ABDOMEN:  Well healed midline vertical incision.  Soft, non-tender, with active bowel sounds, and no hepatosplenomegaly.  No masses. SKIN:  No rashes, ulcers or lesions. EXTREMITIES: No edema, no skin discoloration or tenderness.  No palpable cords. LYMPH NODES: No palpable cervical, supraclavicular, axillary or inguinal adenopathy  NEUROLOGICAL: Unremarkable. PSYCH:  Appropriate.   Appointment on 11/11/2014  Component Date Value Ref Range Status  . WBC 11/11/2014 8.4  3.8 - 10.6 K/uL Final  . RBC 11/11/2014 4.32* 4.40 - 5.90 MIL/uL Final  . Hemoglobin 11/11/2014 13.4  13.0 - 18.0 g/dL Final  . HCT 11/11/2014 39.7* 40.0 - 52.0 % Final  . MCV 11/11/2014 91.9  80.0 - 100.0 fL Final  . MCH 11/11/2014 31.0  26.0 - 34.0 pg Final  . MCHC 11/11/2014 33.8  32.0 - 36.0 g/dL Final  . RDW 11/11/2014 12.8  11.5 - 14.5 % Final  . Platelets 11/11/2014 212  150 - 440 K/uL Final  . Neutrophils Relative % 11/11/2014 65   Final  . Neutro Abs 11/11/2014 5.4  1.4 - 6.5 K/uL Final  . Lymphocytes Relative 11/11/2014 24   Final  . Lymphs Abs 11/11/2014 2.0  1.0 - 3.6 K/uL Final  . Monocytes Relative 11/11/2014 8   Final  . Monocytes Absolute 11/11/2014 0.7  0.2 - 1.0 K/uL Final  . Eosinophils Relative 11/11/2014 1   Final  . Eosinophils Absolute 11/11/2014 0.1  0 - 0.7 K/uL Final  . Basophils Relative 11/11/2014 2   Final  . Basophils Absolute 11/11/2014 0.2* 0 - 0.1 K/uL Final  . Sodium 11/11/2014 137  135 - 145 mmol/L Final  . Potassium 11/11/2014 3.7  3.5 - 5.1 mmol/L Final  . Chloride 11/11/2014 101   101 - 111 mmol/L Final  . CO2 11/11/2014 28  22 - 32 mmol/L Final  . Glucose, Bld 11/11/2014 107* 65 - 99 mg/dL Final  . BUN 11/11/2014 17  6 - 20 mg/dL Final  . Creatinine, Ser 11/11/2014 0.94  0.61 - 1.24 mg/dL Final  . Calcium 11/11/2014 9.4  8.9 - 10.3 mg/dL Final  . Total Protein 11/11/2014 7.4  6.5 - 8.1 g/dL Final  . Albumin 11/11/2014 4.0  3.5 - 5.0 g/dL Final  . AST 11/11/2014 44* 15 - 41 U/L Final  . ALT 11/11/2014 26  17 - 63 U/L Final  . Alkaline Phosphatase 11/11/2014 61  38 - 126 U/L Final  . Total Bilirubin 11/11/2014 0.9  0.3 - 1.2 mg/dL Final  . GFR calc non Af Amer 11/11/2014 >60  >60 mL/min Final  . GFR calc Af Amer 11/11/2014 >60  >60 mL/min Final   Comment: (NOTE) The eGFR has been calculated using the CKD EPI  equation. This calculation has not been validated in all clinical situations. eGFR's persistently <60 mL/min signify possible Chronic Kidney Disease.   Georgiann Hahn gap 11/11/2014 8  5 - 15 Final    Assessment:  Jared Gutierrez is a 74 y.o. male with stage I colon cancer status post transverse colectomy on 10/17/2013.  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  Tumor extended into the submucosa. Margins were negative. There was lymphovascular invasion. 14 lymph nodes were negative. Pathologic stage was T1 N0.  Colonoscopy on 10/02/2014 noted several 3 mm and a 8 mm polyp in the cecum and transverse colon.  Pathology revealed tubular adenomas negative for high-grade dysplasia and malignancy.  Symptomatically, he feels good.  Exam is unremarkable.    Plan: 1.  Review entire medical history, diagnosis and management of colon cancer. 2.  Labs today:  CBC with diff, CMP, CEA. 3.  Anticipate next colonoscopy in 09/2016. 4.  RTC in 6 months for MD assess and labs (CBC with diff, CMP, CEA).    Lequita Asal, MD  11/11/2014, 11:19 PM

## 2014-11-12 LAB — CEA: CEA: 2.3 ng/mL (ref 0.0–4.7)

## 2015-02-03 DIAGNOSIS — C189 Malignant neoplasm of colon, unspecified: Secondary | ICD-10-CM | POA: Insufficient documentation

## 2015-05-12 ENCOUNTER — Inpatient Hospital Stay: Payer: Medicare Other | Attending: Hematology and Oncology

## 2015-05-12 ENCOUNTER — Other Ambulatory Visit: Payer: Self-pay

## 2015-05-12 ENCOUNTER — Encounter: Payer: Self-pay | Admitting: Hematology and Oncology

## 2015-05-12 ENCOUNTER — Inpatient Hospital Stay (HOSPITAL_BASED_OUTPATIENT_CLINIC_OR_DEPARTMENT_OTHER): Payer: Medicare Other | Admitting: Hematology and Oncology

## 2015-05-12 VITALS — BP 160/76 | HR 58 | Temp 97.8°F | Resp 18 | Ht 69.0 in | Wt 273.8 lb

## 2015-05-12 DIAGNOSIS — I10 Essential (primary) hypertension: Secondary | ICD-10-CM | POA: Diagnosis not present

## 2015-05-12 DIAGNOSIS — C184 Malignant neoplasm of transverse colon: Secondary | ICD-10-CM

## 2015-05-12 DIAGNOSIS — Z87442 Personal history of urinary calculi: Secondary | ICD-10-CM | POA: Insufficient documentation

## 2015-05-12 DIAGNOSIS — Z79899 Other long term (current) drug therapy: Secondary | ICD-10-CM

## 2015-05-12 DIAGNOSIS — Z7982 Long term (current) use of aspirin: Secondary | ICD-10-CM | POA: Diagnosis not present

## 2015-05-12 DIAGNOSIS — D120 Benign neoplasm of cecum: Secondary | ICD-10-CM

## 2015-05-12 DIAGNOSIS — D123 Benign neoplasm of transverse colon: Secondary | ICD-10-CM | POA: Insufficient documentation

## 2015-05-12 DIAGNOSIS — C189 Malignant neoplasm of colon, unspecified: Secondary | ICD-10-CM

## 2015-05-12 LAB — CBC WITH DIFFERENTIAL/PLATELET
Basophils Absolute: 0.1 10*3/uL (ref 0–0.1)
Basophils Relative: 1 %
Eosinophils Absolute: 0.1 10*3/uL (ref 0–0.7)
Eosinophils Relative: 1 %
HCT: 41.1 % (ref 40.0–52.0)
Hemoglobin: 13.7 g/dL (ref 13.0–18.0)
Lymphocytes Relative: 29 %
Lymphs Abs: 2.3 10*3/uL (ref 1.0–3.6)
MCH: 30.6 pg (ref 26.0–34.0)
MCHC: 33.3 g/dL (ref 32.0–36.0)
MCV: 91.9 fL (ref 80.0–100.0)
Monocytes Absolute: 0.6 10*3/uL (ref 0.2–1.0)
Monocytes Relative: 7 %
Neutro Abs: 4.8 10*3/uL (ref 1.4–6.5)
Neutrophils Relative %: 62 %
Platelets: 211 10*3/uL (ref 150–440)
RBC: 4.47 MIL/uL (ref 4.40–5.90)
RDW: 12.9 % (ref 11.5–14.5)
WBC: 7.8 10*3/uL (ref 3.8–10.6)

## 2015-05-12 LAB — COMPREHENSIVE METABOLIC PANEL
ALT: 27 U/L (ref 17–63)
AST: 30 U/L (ref 15–41)
Albumin: 4.1 g/dL (ref 3.5–5.0)
Alkaline Phosphatase: 63 U/L (ref 38–126)
Anion gap: 7 (ref 5–15)
BUN: 11 mg/dL (ref 6–20)
CO2: 30 mmol/L (ref 22–32)
Calcium: 9.5 mg/dL (ref 8.9–10.3)
Chloride: 98 mmol/L — ABNORMAL LOW (ref 101–111)
Creatinine, Ser: 0.78 mg/dL (ref 0.61–1.24)
GFR calc Af Amer: >60 mL/min (ref >60)
GFR calc non Af Amer: >60 mL/min (ref >60)
Glucose, Bld: 98 mg/dL (ref 65–99)
Potassium: 3.9 mmol/L (ref 3.5–5.1)
Sodium: 135 mmol/L (ref 135–145)
Total Bilirubin: 0.9 mg/dL (ref 0.3–1.2)
Total Protein: 7.5 g/dL (ref 6.5–8.1)

## 2015-05-12 NOTE — Progress Notes (Signed)
[facility] day:  05/12/2015  Chief Complaint: Jared Gutierrez is a 74 y.o. male with stage I colon cancer who is seen for 6 month assessment.  HPI: The patient was last seen in the medical oncology clinic on 11/11/2014.  At that time, he was seen for initial assessment by me.  He had a recent colonoscopy with several small tubulovillous adenomas.  Next colonoscopy is planned in 2 years.  He felt good except for his knees.  He denied any GI symptoms.  Labs included a hematocrit of 39.7, hemoglobin 13.4, and MCV 91.9.  Comprehensive metabolic panel and CEA (2.3) were normal.   During the interim, he denies any concerns.  He is eating well.  He denies any complaints.  He is active.  He notes only "bad knees".  Past Medical History  Diagnosis Date  . Hypertension   . Cancer ([place])   . Cavitary lesion of lung   . Nephrolithiasis   . Colon cancer ([place])   . Arthritis     Past Surgical History  Procedure Laterality Date  . Colonoscopy N/A 10/02/2014    Procedure: COLONOSCOPY;  Surgeon: Josefine Class, MD;  Location: [facility] ENDOSCOPY;  Service: Endoscopy;  Laterality: N/A;    No family history on file.  Social History:  reports that he has never smoked. He has never used smokeless tobacco. He reports that he does not drink alcohol or use illicit drugs.  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  The patient is alone today.  Allergies: No Known Allergies  Current Medications: Current Outpatient Prescriptions  Medication Sig Dispense Refill  . aspirin 81 MG tablet Take 81 mg by mouth daily.    [name] glucosamine-chondroitin 500-400 MG tablet Take 1 tablet by mouth daily.    [name] losartan-hydrochlorothiazide (HYZAAR) 50-12.5 MG per tablet Take 1 tablet by mouth daily.    [name] oxybutynin (DITROPAN) 5 MG tablet Take 5 mg by mouth 3 (three) times daily.    . Probiotic Product (PROBIOTIC DAILY PO) Take 1 tablet by mouth daily.      No current facility-administered medications for this visit.    Review of Systems:  GENERAL:  Feels good.  Active.  No fevers, sweats or weight loss. PERFORMANCE STATUS (ECOG):  0 HEENT:  No visual changes, runny nose, sore throat, mouth sores or tenderness. Lungs: No shortness of breath or cough.  No hemoptysis. Cardiac:  No chest pain, palpitations, orthopnea, or PND. GI:  No nausea, vomiting, diarrhea, constipation, melena or hematochezia. GU:  No urgency, frequency, dysuria, or hematuria. Musculoskeletal:  No back pain.  No joint pain.  No muscle tenderness. Extremities:  Bad knees.  No swelling. Skin:  No rashes or skin changes. Neuro:  No headache, numbness or weakness, balance or coordination issues. Endocrine:  No diabetes, thyroid issues, hot flashes or night sweats. Psych:  No mood changes, depression or anxiety. Pain:  No focal pain. Review of systems:  All other systems reviewed and found to be negative.   Physical Exam: Blood pressure 160/76, pulse 58, temperature 97.8 F (36.6 C), resp. rate 18, height 5' 9"  (1.753 m), weight 273 lb 13 oz (124.2 kg). GENERAL:  Well developed, well nourished, muscular gentleman sitting comfortably in the exam room in no acute distress. MENTAL STATUS:  Alert and oriented to person, place and time. HEAD:  Wearing a cap.  Gray hair with beard.  Normocephalic, atraumatic,  face symmetric, no Cushingoid features. EYES:  Brown/green eyes.  Pupils equal round and reactive to light and accomodation.  No conjunctivitis or scleral icterus. ENT:  Oropharynx clear without lesion.  Dentures.  Tongue normal. Mucous membranes moist.  RESPIRATORY:  Clear to auscultation without rales, wheezes or rhonchi. CARDIOVASCULAR:  Regular rate and rhythm without murmur, rub or gallop. ABDOMEN:  Well healed midline vertical incision.  Soft, non-tender, with active bowel sounds, and no hepatosplenomegaly.  No masses. SKIN:  No rashes, ulcers or  lesions. EXTREMITIES: No edema, no skin discoloration or tenderness.  No palpable cords. LYMPH NODES: No palpable cervical, supraclavicular, axillary or inguinal adenopathy  NEUROLOGICAL: Unremarkable. PSYCH:  Appropriate.   Appointment on 05/12/2015  Component Date Value Ref Range Status  . WBC 05/12/2015 7.8  3.8 - 10.6 K/uL Final  . RBC 05/12/2015 4.47  4.40 - 5.90 MIL/uL Final  . Hemoglobin 05/12/2015 13.7  13.0 - 18.0 g/dL Final  . HCT 05/12/2015 41.1  40.0 - 52.0 % Final  . MCV 05/12/2015 91.9  80.0 - 100.0 fL Final  . MCH 05/12/2015 30.6  26.0 - 34.0 pg Final  . MCHC 05/12/2015 33.3  32.0 - 36.0 g/dL Final  . RDW 05/12/2015 12.9  11.5 - 14.5 % Final  . Platelets 05/12/2015 211  150 - 440 K/uL Final  . Neutrophils Relative % 05/12/2015 62   Final  . Neutro Abs 05/12/2015 4.8  1.4 - 6.5 K/uL Final  . Lymphocytes Relative 05/12/2015 29   Final  . Lymphs Abs 05/12/2015 2.3  1.0 - 3.6 K/uL Final  . Monocytes Relative 05/12/2015 7   Final  . Monocytes Absolute 05/12/2015 0.6  0.2 - 1.0 K/uL Final  . Eosinophils Relative 05/12/2015 1   Final  . Eosinophils Absolute 05/12/2015 0.1  0 - 0.7 K/uL Final  . Basophils Relative 05/12/2015 1   Final  . Basophils Absolute 05/12/2015 0.1  0 - 0.1 K/uL Final  . Sodium 05/12/2015 135  135 - 145 mmol/L Final  . Potassium 05/12/2015 3.9  3.5 - 5.1 mmol/L Final  . Chloride 05/12/2015 98* 101 - 111 mmol/L Final  . CO2 05/12/2015 30  22 - 32 mmol/L Final  . Glucose, Bld 05/12/2015 98  65 - 99 mg/dL Final  . BUN 05/12/2015 11  6 - 20 mg/dL Final  . Creatinine, Ser 05/12/2015 0.78  0.61 - 1.24 mg/dL Final  . Calcium 05/12/2015 9.5  8.9 - 10.3 mg/dL Final  . Total Protein 05/12/2015 7.5  6.5 - 8.1 g/dL Final  . Albumin 05/12/2015 4.1  3.5 - 5.0 g/dL Final  . AST 05/12/2015 30  15 - 41 U/L Final  . ALT 05/12/2015 27  17 - 63 U/L Final  . Alkaline Phosphatase 05/12/2015 63  38 - 126 U/L Final  . Total Bilirubin 05/12/2015 0.9  0.3 - 1.2 mg/dL Final   . GFR calc non Af Amer 05/12/2015 >60  >60 mL/min Final  . GFR calc Af Amer 05/12/2015 >60  >60 mL/min Final   Comment: (NOTE) The eGFR has been calculated using the CKD EPI equation. This calculation has not been validated in all clinical situations. eGFR's persistently <60 mL/min signify possible Chronic Kidney Disease.   Georgiann Hahn gap 05/12/2015 7  5 - 15 Final    Assessment:  Jared Gutierrez is a 74 y.o. male with stage I colon cancer status post transverse colectomy on 10/17/2013.  Pathology revealed a 1 cm moderately differentiated invasive adenocarcinoma arising in a  4.6 cm tubulovillous adenoma with high-grade dysplasia.  Tumor extended into the submucosa. Margins were negative. There was lymphovascular invasion. 14 lymph nodes were negative. Pathologic stage was T1 N0.  Colonoscopy on 10/02/2014 noted several 3 mm polyps and an 8 mm polyp in the cecum and transverse colon.  Pathology revealed tubular adenomas negative for high-grade dysplasia and malignancy.  CEA was 2.3 on 11/11/2014.  Symptomatically, he feels good except for his knees.  Exam is unremarkable.    Plan: 1.  Labs today:  CBC with diff, CMP, CEA. 2.  Anticipate next colonoscopy in 09/2016. 3.  RTC in 6 months for MD assess and labs (CBC with diff, CMP, CEA).    Lequita Asal, MD  05/12/2015, 1:33 PM

## 2015-05-13 LAB — CEA: CEA: 3 ng/mL (ref 0.0–4.7)

## 2015-11-10 ENCOUNTER — Inpatient Hospital Stay (HOSPITAL_BASED_OUTPATIENT_CLINIC_OR_DEPARTMENT_OTHER): Payer: Medicare Other | Admitting: Hematology and Oncology

## 2015-11-10 ENCOUNTER — Inpatient Hospital Stay: Payer: Medicare Other | Attending: Hematology and Oncology

## 2015-11-10 ENCOUNTER — Encounter: Payer: Self-pay | Admitting: Hematology and Oncology

## 2015-11-10 VITALS — BP 153/77 | HR 60 | Temp 97.5°F | Resp 19 | Ht 69.0 in | Wt 269.7 lb

## 2015-11-10 DIAGNOSIS — C184 Malignant neoplasm of transverse colon: Secondary | ICD-10-CM | POA: Diagnosis not present

## 2015-11-10 DIAGNOSIS — M1990 Unspecified osteoarthritis, unspecified site: Secondary | ICD-10-CM | POA: Insufficient documentation

## 2015-11-10 DIAGNOSIS — I10 Essential (primary) hypertension: Secondary | ICD-10-CM | POA: Insufficient documentation

## 2015-11-10 DIAGNOSIS — D120 Benign neoplasm of cecum: Secondary | ICD-10-CM | POA: Diagnosis not present

## 2015-11-10 DIAGNOSIS — D123 Benign neoplasm of transverse colon: Secondary | ICD-10-CM

## 2015-11-10 DIAGNOSIS — Z79899 Other long term (current) drug therapy: Secondary | ICD-10-CM | POA: Diagnosis not present

## 2015-11-10 DIAGNOSIS — Z7982 Long term (current) use of aspirin: Secondary | ICD-10-CM | POA: Diagnosis not present

## 2015-11-10 LAB — COMPREHENSIVE METABOLIC PANEL
ALT: 27 U/L (ref 17–63)
AST: 30 U/L (ref 15–41)
Albumin: 3.9 g/dL (ref 3.5–5.0)
Alkaline Phosphatase: 65 U/L (ref 38–126)
Anion gap: 6 (ref 5–15)
BUN: 15 mg/dL (ref 6–20)
CO2: 30 mmol/L (ref 22–32)
Calcium: 9.6 mg/dL (ref 8.9–10.3)
Chloride: 98 mmol/L — ABNORMAL LOW (ref 101–111)
Creatinine, Ser: 0.87 mg/dL (ref 0.61–1.24)
GFR calc Af Amer: >60 mL/min (ref >60)
GFR calc non Af Amer: >60 mL/min (ref >60)
Glucose, Bld: 108 mg/dL — ABNORMAL HIGH (ref 65–99)
Potassium: 4.2 mmol/L (ref 3.5–5.1)
Sodium: 134 mmol/L — ABNORMAL LOW (ref 135–145)
Total Bilirubin: 0.9 mg/dL (ref 0.3–1.2)
Total Protein: 7.5 g/dL (ref 6.5–8.1)

## 2015-11-10 LAB — CBC WITH DIFFERENTIAL/PLATELET
Basophils Absolute: 0.1 10*3/uL (ref 0–0.1)
Basophils Relative: 1 %
Eosinophils Absolute: 0.1 10*3/uL (ref 0–0.7)
Eosinophils Relative: 1 %
HCT: 40.5 % (ref 40.0–52.0)
Hemoglobin: 13.7 g/dL (ref 13.0–18.0)
Lymphocytes Relative: 24 %
Lymphs Abs: 1.9 10*3/uL (ref 1.0–3.6)
MCH: 31.2 pg (ref 26.0–34.0)
MCHC: 33.8 g/dL (ref 32.0–36.0)
MCV: 92.2 fL (ref 80.0–100.0)
Monocytes Absolute: 0.5 10*3/uL (ref 0.2–1.0)
Monocytes Relative: 7 %
Neutro Abs: 5.4 10*3/uL (ref 1.4–6.5)
Neutrophils Relative %: 67 %
Platelets: 212 10*3/uL (ref 150–440)
RBC: 4.39 MIL/uL — ABNORMAL LOW (ref 4.40–5.90)
RDW: 13 % (ref 11.5–14.5)
WBC: 8 10*3/uL (ref 3.8–10.6)

## 2015-11-10 NOTE — Progress Notes (Signed)
Pt reports no changes since last visit.  Only complaintt is knee pain today in both knees

## 2015-11-10 NOTE — Progress Notes (Signed)
[facility] day:  11/10/2015  Chief Complaint: Jared Gutierrez is a 75 y.o. male with stage I colon cancer who is seen for 6 month assessment.  HPI: The patient was last seen in the medical oncology clinic on 05/12/2015.  At that time, he denied any concerns.  Exam was unremarkable.  Labs included a normal CBC, CMP, and CEA (3.0).  During the interim, he has continued to do well.  He denies any GI symptoms.  He denies any melena or hematochezia.  He continues to note only "bad knees".   Past Medical History  Diagnosis Date  . Hypertension   . Cancer ([place])   . Cavitary lesion of lung   . Nephrolithiasis   . Colon cancer ([place])   . Arthritis     Past Surgical History  Procedure Laterality Date  . Colonoscopy N/A 10/02/2014    Procedure: COLONOSCOPY;  Surgeon: Josefine Class, MD;  Location: [facility] ENDOSCOPY;  Service: Endoscopy;  Laterality: N/A;    History reviewed. No pertinent family history.  Social History:  reports that he has never smoked. He has never used smokeless tobacco. He reports that he does not drink alcohol or use illicit drugs.  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  The patient is alone today.  Allergies: No Known Allergies  Current Medications: Current Outpatient Prescriptions  Medication Sig Dispense Refill  . aspirin 81 MG tablet Take 81 mg by mouth daily.    . ferrous sulfate 325 (65 FE) MG EC tablet Take by mouth.    [name] glucosamine-chondroitin 500-400 MG tablet Take 1 tablet by mouth daily.    [name] losartan-hydrochlorothiazide (HYZAAR) 50-12.5 MG per tablet Take 1 tablet by mouth daily.    [name] oxybutynin (DITROPAN) 5 MG tablet Take 5 mg by mouth 3 (three) times daily.    . Probiotic Product (PROBIOTIC DAILY PO) Take 1 tablet by mouth daily.     No current facility-administered medications for this visit.    Review of Systems:  GENERAL:  Feels good.  Active.  No fevers or sweats.   Weight loss of 4 pounds. PERFORMANCE STATUS (ECOG):  0 HEENT:  No visual changes, runny nose, sore throat, mouth sores or tenderness. Lungs: No shortness of breath or cough.  No hemoptysis. Cardiac:  No chest pain, palpitations, orthopnea, or PND. GI:  No nausea, vomiting, diarrhea, constipation, melena or hematochezia. GU:  No urgency, frequency, dysuria, or hematuria. Musculoskeletal:  No back pain.  Bilateral knee pain (right > left).  No muscle tenderness. Extremities:  Bad knees.  No swelling. Skin:  No rashes or skin changes. Neuro:  No headache, numbness or weakness, balance or coordination issues. Endocrine:  No diabetes, thyroid issues, hot flashes or night sweats. Psych:  No mood changes, depression or anxiety. Pain:  No focal pain. Review of systems:  All other systems reviewed and found to be negative.   Physical Exam: Blood pressure 153/77, pulse 60, temperature 97.5 F (36.4 C), temperature source Tympanic, resp. rate 19, height 5' 9"  (1.753 m), weight 269 lb 11.7 oz (122.35 kg). GENERAL:  Well developed, well nourished, muscular gentleman sitting comfortably in the exam room in no acute distress. MENTAL STATUS:  Alert and oriented to person, place and time. HEAD:  Pearline Cables hair with beard.  Normocephalic, atraumatic, face symmetric, no Cushingoid features. EYES:  Brown/green eyes.  Pupils equal round and reactive to light and accomodation.  No conjunctivitis or scleral icterus. ENT:  Oropharynx clear without lesion.  Dentures.  Tongue normal. Mucous membranes moist.  RESPIRATORY:  Clear to auscultation without rales, wheezes or rhonchi. CARDIOVASCULAR:  Regular rate and rhythm without murmur, rub or gallop. ABDOMEN:  Well healed midline vertical incision.  Soft, non-tender, with active bowel sounds, and no hepatosplenomegaly.  No masses. SKIN:  No rashes, ulcers or lesions. EXTREMITIES: No edema, no skin discoloration or tenderness.  No palpable cords. LYMPH NODES: No  palpable cervical, supraclavicular, axillary or inguinal adenopathy  NEUROLOGICAL: Unremarkable. PSYCH:  Appropriate.   Appointment on 11/10/2015  Component Date Value Ref Range Status  . WBC 11/10/2015 8.0  3.8 - 10.6 K/uL Final  . RBC 11/10/2015 4.39* 4.40 - 5.90 MIL/uL Final  . Hemoglobin 11/10/2015 13.7  13.0 - 18.0 g/dL Final  . HCT 11/10/2015 40.5  40.0 - 52.0 % Final  . MCV 11/10/2015 92.2  80.0 - 100.0 fL Final  . MCH 11/10/2015 31.2  26.0 - 34.0 pg Final  . MCHC 11/10/2015 33.8  32.0 - 36.0 g/dL Final  . RDW 11/10/2015 13.0  11.5 - 14.5 % Final  . Platelets 11/10/2015 212  150 - 440 K/uL Final  . Neutrophils Relative % 11/10/2015 67%   Final  . Neutro Abs 11/10/2015 5.4  1.4 - 6.5 K/uL Final  . Lymphocytes Relative 11/10/2015 24%   Final  . Lymphs Abs 11/10/2015 1.9  1.0 - 3.6 K/uL Final  . Monocytes Relative 11/10/2015 7%   Final  . Monocytes Absolute 11/10/2015 0.5  0.2 - 1.0 K/uL Final  . Eosinophils Relative 11/10/2015 1%   Final  . Eosinophils Absolute 11/10/2015 0.1  0 - 0.7 K/uL Final  . Basophils Relative 11/10/2015 1%   Final  . Basophils Absolute 11/10/2015 0.1  0 - 0.1 K/uL Final  . Sodium 11/10/2015 134* 135 - 145 mmol/L Final  . Potassium 11/10/2015 4.2  3.5 - 5.1 mmol/L Final  . Chloride 11/10/2015 98* 101 - 111 mmol/L Final  . CO2 11/10/2015 30  22 - 32 mmol/L Final  . Glucose, Bld 11/10/2015 108* 65 - 99 mg/dL Final  . BUN 11/10/2015 15  6 - 20 mg/dL Final  . Creatinine, Ser 11/10/2015 0.87  0.61 - 1.24 mg/dL Final  . Calcium 11/10/2015 9.6  8.9 - 10.3 mg/dL Final  . Total Protein 11/10/2015 7.5  6.5 - 8.1 g/dL Final  . Albumin 11/10/2015 3.9  3.5 - 5.0 g/dL Final  . AST 11/10/2015 30  15 - 41 U/L Final  . ALT 11/10/2015 27  17 - 63 U/L Final  . Alkaline Phosphatase 11/10/2015 65  38 - 126 U/L Final  . Total Bilirubin 11/10/2015 0.9  0.3 - 1.2 mg/dL Final  . GFR calc non Af Amer 11/10/2015 >60  >60 mL/min Final  . GFR calc Af Amer 11/10/2015 >60  >60  mL/min Final   Comment: (NOTE) The eGFR has been calculated using the CKD EPI equation. This calculation has not been validated in all clinical situations. eGFR's persistently <60 mL/min signify possible Chronic Kidney Disease.   Georgiann Hahn gap 11/10/2015 6  5 - 15 Final    Assessment:  JERIAH CORKUM is a 75 y.o. male with stage I colon cancer status post transverse colectomy on 10/17/2013.  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  Tumor extended into the submucosa. Margins were negative. There was lymphovascular invasion. 14 lymph nodes  were negative. Pathologic stage was T1 N0.  Colonoscopy on 10/02/2014 noted several 3 mm polyps and an 8 mm polyp in the cecum and transverse colon.  Pathology revealed tubular adenomas negative for high-grade dysplasia and malignancy.  CEA was 2.3 on 11/11/2014 and 3.0 on 05/12/2015.  Symptomatically, he continues to feel good except for his knees.  Exam is stable.    Plan: 1.  Labs today:  CBC with diff, CMP, CEA. 2.  Anticipate next colonoscopy in 09/2016 ([facility]). 3.  RTC in 6 months for MD assess and labs (CBC with diff, CMP, CEA).    Lequita Asal, MD  11/10/2015, 9:49 AM

## 2015-11-10 NOTE — Addendum Note (Signed)
Addended by: Luella Cook on: 11/10/2015 10:34 AM   Modules accepted: Orders

## 2015-11-11 LAB — CEA: CEA: 2.7 ng/mL (ref 0.0–4.7)

## 2016-05-10 ENCOUNTER — Inpatient Hospital Stay (HOSPITAL_BASED_OUTPATIENT_CLINIC_OR_DEPARTMENT_OTHER): Payer: Medicare Other | Admitting: Hematology and Oncology

## 2016-05-10 ENCOUNTER — Inpatient Hospital Stay: Payer: Medicare Other | Attending: Hematology and Oncology

## 2016-05-10 ENCOUNTER — Encounter: Payer: Self-pay | Admitting: Hematology and Oncology

## 2016-05-10 VITALS — BP 151/88 | HR 62 | Temp 98.1°F | Resp 18 | Wt 272.7 lb

## 2016-05-10 DIAGNOSIS — Z7982 Long term (current) use of aspirin: Secondary | ICD-10-CM | POA: Insufficient documentation

## 2016-05-10 DIAGNOSIS — C184 Malignant neoplasm of transverse colon: Secondary | ICD-10-CM

## 2016-05-10 DIAGNOSIS — Z85038 Personal history of other malignant neoplasm of large intestine: Secondary | ICD-10-CM | POA: Diagnosis present

## 2016-05-10 DIAGNOSIS — I10 Essential (primary) hypertension: Secondary | ICD-10-CM | POA: Diagnosis not present

## 2016-05-10 DIAGNOSIS — Z79899 Other long term (current) drug therapy: Secondary | ICD-10-CM | POA: Insufficient documentation

## 2016-05-10 LAB — COMPREHENSIVE METABOLIC PANEL
ALT: 28 U/L (ref 17–63)
AST: 30 U/L (ref 15–41)
Albumin: 4.1 g/dL (ref 3.5–5.0)
Alkaline Phosphatase: 66 U/L (ref 38–126)
Anion gap: 8 (ref 5–15)
BUN: 16 mg/dL (ref 6–20)
CO2: 29 mmol/L (ref 22–32)
Calcium: 9.4 mg/dL (ref 8.9–10.3)
Chloride: 99 mmol/L — ABNORMAL LOW (ref 101–111)
Creatinine, Ser: 0.84 mg/dL (ref 0.61–1.24)
GFR calc Af Amer: >60 mL/min (ref >60)
GFR calc non Af Amer: >60 mL/min (ref >60)
Glucose, Bld: 103 mg/dL — ABNORMAL HIGH (ref 65–99)
Potassium: 3.9 mmol/L (ref 3.5–5.1)
Sodium: 136 mmol/L (ref 135–145)
Total Bilirubin: 0.6 mg/dL (ref 0.3–1.2)
Total Protein: 7.5 g/dL (ref 6.5–8.1)

## 2016-05-10 LAB — CBC WITH DIFFERENTIAL/PLATELET
Basophils Absolute: 0.1 10*3/uL (ref 0–0.1)
Basophils Relative: 1 %
Eosinophils Absolute: 0.1 10*3/uL (ref 0–0.7)
Eosinophils Relative: 1 %
HCT: 42.3 % (ref 40.0–52.0)
Hemoglobin: 14.1 g/dL (ref 13.0–18.0)
Lymphocytes Relative: 29 %
Lymphs Abs: 2.1 10*3/uL (ref 1.0–3.6)
MCH: 30.8 pg (ref 26.0–34.0)
MCHC: 33.3 g/dL (ref 32.0–36.0)
MCV: 92.5 fL (ref 80.0–100.0)
Monocytes Absolute: 0.5 10*3/uL (ref 0.2–1.0)
Monocytes Relative: 6 %
Neutro Abs: 4.7 10*3/uL (ref 1.4–6.5)
Neutrophils Relative %: 63 %
Platelets: 200 10*3/uL (ref 150–440)
RBC: 4.57 MIL/uL (ref 4.40–5.90)
RDW: 12.6 % (ref 11.5–14.5)
WBC: 7.4 10*3/uL (ref 3.8–10.6)

## 2016-05-10 NOTE — Assessment & Plan Note (Signed)
he will continue current medical management. I recommend close follow-up with primary care doctor for medication adjustment.  

## 2016-05-10 NOTE — Assessment & Plan Note (Addendum)
He has no signs of clinical recurrence Labs are satisfactory. He is not anemic. I recommend discontinuation of oral iron supplement Per guidelines, and based on his last colonoscopy report, he would be due for another colonoscopy in 2018 I will place order for him to return in 6 months for repeat labs, history and clinical examination

## 2016-05-10 NOTE — Progress Notes (Signed)
Interior progress notes  Patient Care Team: Tracie Harrier, MD as PCP - General (Internal Medicine) Tracie Harrier, MD (Internal Medicine) Josefine Class, MD as Referring Physician (Gastroenterology)  CHIEF COMPLAINTS/PURPOSE OF VISIT:  History of colon cancer  HISTORY OF PRESENTING ILLNESS:  Jared Gutierrez 75 y.o. male was transferred to my care today as his physician is not available today I reviewed the patient's records extensive and collaborated the history with the patient. Summary of his history is as follows:   History of colon cancer, stage I   09/23/2013 Pathology Results    Part A: TRANSVERSE COLON POLYP HOT SNARE:  - TUBULOVILLOUS ADENOMA, SEE COMMENT.  - NEGATIVE FOR HIGH GRADE DYSPLASIA AND MALIGNANCY.  .  Part B: SPLENIC FLEXURE MASS COLD BIOPSY:  - SUPERFICIAL SAMPLING OF A TUBULOVILLOUS ADENOMA WITH AT LEAST  HIGH GRADE DYSPLASIA, SEE COMMENT.       09/23/2013 Initial Diagnosis    His initial cancer was discovered through screening colonoscopy      10/17/2013 Pathology Results    TRANSVERSE COLON, RESECTION:  - INVASIVE ADENOCARCINOMA ARISING IN A 4.6 CM TUBULOVILLOUS  ADENOMA WITH HIGH GRADE DYSPLASIA (MALIGNANT POLYP).  - SEE SUMMARY BELOW.  [name]  ONCOLOGY SUMMARY: COLON AND RECTUM, RESECTION, AJCC 7TH EDITION  Specimen: transverse colon  Procedure: transverse colon resection  Tumor site: splenic flexure  Tumor size: 1.0 cm (invasive component)  Macroscopic tumor perforation: not specified  Histologic type: invasive adenocarcinoma  Histologic grade: moderately differentiated  Microscopic tumor extension: into submucosa  Margins:    Proximal margin: negative    Distal margin: negative    Circumferential (radial) or mesenteric margin: negative    If all margins uninvolved by invasive carcinoma:    Distance of invasive carcinoma from closest margin: 7.5 cm  to distal margin  Treatment effect: not  applicable  Lymph-vascular invasion: present  Perineural invasion: not identified  Tumor deposits (discontinuous extramural extension): not  identified  Pathologic staging:    Primary tumor: pT1    Regional lymph nodes: pN0       Number of nodes examined: 14       Number of nodes involved: 0       10/17/2013 Surgery    He underwent transverse colectomy      10/02/2014 Procedure    He has surveillance colonoscopy. 4 colon polyps were removed      10/02/2014 Pathology Results    [id number] colonoscopy pathology A. CECAL POLYPS; HOT SNARE AND COLD BIOPSY:  - TUBULAR ADENOMA, MULTIPLE FRAGMENTS.  - NEGATIVE FOR HIGH-GRADE DYSPLASIA AND MALIGNANCY.   B. COLON POLYP, TRANSVERSE; COLD BIOPSY:  - TUBULAR ADENOMA.  - NEGATIVE FOR HIGH-GRADE DYSPLASIA AND MALIGNANCY.        He feels well, denies changes in bowel habits or abdominal discomfort The patient denies any recent signs or symptoms of bleeding such as spontaneous epistaxis, hematuria or hematochezia. He continues to work full time  MEDICAL HISTORY:  Past Medical History:  Diagnosis Date  . Arthritis   . Cancer ([place])   . Cavitary lesion of lung   . Colon cancer ([place])   . Hypertension   . Nephrolithiasis     SURGICAL HISTORY: Past Surgical History:  Procedure Laterality Date  . COLONOSCOPY N/A 10/02/2014   Procedure: COLONOSCOPY;  Surgeon: Josefine Class, MD;  Location: [facility] ENDOSCOPY;  Service: Endoscopy;  Laterality: N/A;    SOCIAL HISTORY: Social History   Social History  . Marital status: Married  Spouse name: N/A  . Number of children: N/A  . Years of education: N/A   Occupational History  . Not on file.   Social History Main Topics  . Smoking status: Never Smoker  . Smokeless tobacco: Never Used  . Alcohol use No  . Drug use: No  . Sexual activity: Not on file   Other Topics Concern  . Not on file   Social History Narrative  . No narrative on file    FAMILY  HISTORY: History reviewed. No pertinent family history.  ALLERGIES:  has No Known Allergies.  MEDICATIONS:  Current Outpatient Prescriptions  Medication Sig Dispense Refill  . aspirin 81 MG tablet Take 81 mg by mouth daily.    . ferrous sulfate 325 (65 FE) MG EC tablet Take by mouth.    [name] glucosamine-chondroitin 500-400 MG tablet Take 1 tablet by mouth daily.    [name] losartan-hydrochlorothiazide (HYZAAR) 50-12.5 MG per tablet Take 1 tablet by mouth daily.    [name] oxybutynin (DITROPAN) 5 MG tablet Take 5 mg by mouth 3 (three) times daily.    . Probiotic Product (PROBIOTIC DAILY PO) Take 1 tablet by mouth daily.     No current facility-administered medications for this visit.     REVIEW OF SYSTEMS:   Constitutional: Denies fevers, chills or abnormal night sweats Eyes: Denies blurriness of vision, double vision or watery eyes Ears, nose, mouth, throat, and face: Denies mucositis or sore throat Respiratory: Denies cough, dyspnea or wheezes Cardiovascular: Denies palpitation, chest discomfort or lower extremity swelling Gastrointestinal:  Denies nausea, heartburn or change in bowel habits Skin: Denies abnormal skin rashes Lymphatics: Denies new lymphadenopathy or easy bruising Neurological:Denies numbness, tingling or new weaknesses Behavioral/Psych: Mood is stable, no new changes  All other systems were reviewed with the patient and are negative.  PHYSICAL EXAMINATION: ECOG PERFORMANCE STATUS: 0 - Asymptomatic  Vitals:   05/10/16 0906  BP: (!) 151/88  Pulse: 62  Resp: 18  Temp: 98.1 F (36.7 C)   Filed Weights   05/10/16 0906  Weight: 272 lb 11.3 oz (123.7 kg)    GENERAL:alert, no distress and comfortable SKIN: skin color, texture, turgor are normal, no rashes or significant lesions EYES: normal, conjunctiva are pink and non-injected, sclera clear OROPHARYNX:no exudate, normal lips, buccal mucosa, and tongue  NECK: supple, thyroid normal size, non-tender, without  nodularity LYMPH:  no palpable lymphadenopathy in the cervical, axillary or inguinal LUNGS: clear to auscultation and percussion with normal breathing effort HEART: regular rate & rhythm and no murmurs without lower extremity edema ABDOMEN:abdomen soft, non-tender and normal bowel sounds. Well healed surgical scars Musculoskeletal:no cyanosis of digits and no clubbing  PSYCH: alert & oriented x 3 with fluent speech NEURO: no focal motor/sensory deficits  LABORATORY DATA:  I have reviewed the data as listed Lab Results  Component Value Date   WBC 7.4 05/10/2016   HGB 14.1 05/10/2016   HCT 42.3 05/10/2016   MCV 92.5 05/10/2016   PLT 200 05/10/2016    Recent Labs  05/12/15 1051 11/10/15 0925 05/10/16 0849  NA 135 134* 136  K 3.9 4.2 3.9  CL 98* 98* 99*  CO2 30 30 29   GLUCOSE 98 108* 103*  BUN 11 15 16   CREATININE 0.78 0.87 0.84  CALCIUM 9.5 9.6 9.4  GFRNONAA >60 >60 >60  GFRAA >60 >60 >60  PROT 7.5 7.5 7.5  ALBUMIN 4.1 3.9 4.1  AST 30 30 30   ALT 27 27 28   ALKPHOS 63 65 66  BILITOT 0.9 0.9 0.6    ASSESSMENT & PLAN:  History of colon cancer, stage I He has no signs of clinical recurrence Labs are satisfactory. He is not anemic. I recommend discontinuation of oral iron supplement Per guidelines, and based on his last colonoscopy report, he would be due for another colonoscopy in 2018 I will place order for him to return in 6 months for repeat labs, history and clinical examination  Hypertension he will continue current medical management. I recommend close follow-up with primary care doctor for medication adjustment.    Orders Placed This Encounter  Procedures  . Comprehensive metabolic panel    Standing Status:   Future    Standing Expiration Date:   06/14/2017  . CBC with Differential/Platelet    Standing Status:   Future    Standing Expiration Date:   06/14/2017  . CEA    Standing Status:   Future    Standing Expiration Date:   06/14/2017    All  questions were answered. The patient knows to call the clinic with any problems, questions or concerns. I spent 15 minutes counseling the patient face to face. The total time spent in the appointment was 20 minutes and more than 50% was on counseling.     Heath Lark, MD 05/10/2016 9:30 AM

## 2016-05-11 LAB — CEA: CEA: 3.2 ng/mL (ref 0.0–4.7)

## 2016-10-31 NOTE — Progress Notes (Signed)
[facility]cer Center  Clinic day:  11/01/16  Chief Complaint: Jared Gutierrez is a 76 y.o. male with stage I colon cancer who is seen for 6 month assessment.  HPI: The patient was last seen in the medical oncology clinic by me on 11/10/2015.  At that time, he denied any concerns.  Exam was unremarkable.  Labs included a normal CBC, CMP, and CEA (2.7).  He was seen by Dr. Ni Gorsuch in my absence on 05/10/2017.  At that time, he denied any complaint.  CEA was normal (3.2).  Oral iron was discontinued.  He was seen by gastroenterology on 10/10/2016.  He is scheduled for colonoscopy on 02/16/2017.  Symptomatically, reporting chronic bilateral knee pain. He is under the care of orthopedics for his osteoarthritis; needs to have bilateral knee replacements. Patient reports that his energy levels are "fine" and that he feels well otherwise.    Past Medical History:  Diagnosis Date  . Arthritis   . Cancer ([facility])   . Cavitary lesion of lung   . Colon cancer ([facility])   . Hypertension   . Nephrolithiasis     Past Surgical History:  Procedure Laterality Date  . COLONOSCOPY N/A 10/02/2014   Procedure: COLONOSCOPY;  Surgeon: Matthew Gordon Rein, MD;  Location: [facility] ENDOSCOPY;  Service: Endoscopy;  Laterality: N/A;    History reviewed. No pertinent family history.  Social History:  reports that he has never smoked. He has never used smokeless tobacco. He reports that he does not drink alcohol or use drugs.  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  The patient is alone today.  Allergies: No Known Allergies  Current Medications: Current Outpatient Prescriptions  Medication Sig Dispense Refill  . aspirin 81 MG tablet Take 81 mg by mouth daily.    . glucosamine-chondroitin 500-400 MG tablet Take 1 tablet by mouth daily.    . losartan-hydrochlorothiazide (HYZAAR) 50-12.5 MG per tablet Take 1 tablet by mouth daily.    . oxybutynin (DITROPAN)  5 MG tablet Take 5 mg by mouth 3 (three) times daily.    . Probiotic Product (PROBIOTIC DAILY PO) Take 1 tablet by mouth daily.    . ferrous sulfate 325 (65 FE) MG EC tablet Take by mouth.     No current facility-administered medications for this visit.     Review of Systems:  GENERAL:  Feels "fairly good".  No fevers or sweats.  Weight down 1 pound. PERFORMANCE STATUS (ECOG):  0 HEENT:  No visual changes, runny nose, sore throat, mouth sores or tenderness. Lungs: No shortness of breath or cough.  No hemoptysis. Cardiac:  No chest pain, palpitations, orthopnea, or PND. GI:  No nausea, vomiting, diarrhea, constipation, melena or hematochezia. GU:  No urgency, frequency, dysuria, or hematuria. Musculoskeletal:  No back pain.  Bilateral knee pain (right > left).  No muscle tenderness. Extremities:  Bad knees.  No swelling. Skin:  No rashes or skin changes. Neuro:  No headache, numbness or weakness, balance or coordination issues. Endocrine:  No diabetes, thyroid issues, hot flashes or night sweats. Psych:  No mood changes, depression or anxiety. Pain:  No focal pain. Review of systems:  All other systems reviewed and found to be negative.   Physical Exam: Blood pressure (!) 151/78, pulse 93, temperature (!) 96.6 F (35.9 C), temperature source Tympanic, resp. rate 18, weight 271 lb 9.7 oz (123.2 kg). GENERAL:  Well developed, well nourished, muscular   gentleman sitting comfortably in the exam room in no acute distress. MENTAL STATUS:  Alert and oriented to person, place and time. HEAD:  Gray hair with beard.  Male pattern baldness.  Normocephalic, atraumatic, face symmetric, no Cushingoid features. EYES:  Brown/green eyes.  Pupils equal round and reactive to light and accomodation.  No conjunctivitis or scleral icterus. ENT:  Oropharynx clear without lesion.  Dentures.  Tongue normal. Mucous membranes moist.  RESPIRATORY:  Clear to auscultation without rales, wheezes or  rhonchi. CARDIOVASCULAR:  Regular rate and rhythm without murmur, rub or gallop. ABDOMEN:  Well healed midline vertical incision.  Soft, non-tender, with active bowel sounds, and no hepatosplenomegaly.  No masses. SKIN:  No rashes, ulcers or lesions. EXTREMITIES: No edema, no skin discoloration or tenderness.  No palpable cords. LYMPH NODES: No palpable cervical, supraclavicular, axillary or inguinal adenopathy  NEUROLOGICAL: Unremarkable. PSYCH:  Appropriate.    Appointment on 11/01/2016  Component Date Value Ref Range Status  . Sodium 11/01/2016 135  135 - 145 mmol/L Final  . Potassium 11/01/2016 3.6  3.5 - 5.1 mmol/L Final  . Chloride 11/01/2016 100* 101 - 111 mmol/L Final  . CO2 11/01/2016 29  22 - 32 mmol/L Final  . Glucose, Bld 11/01/2016 103* 65 - 99 mg/dL Final  . BUN 11/01/2016 15  6 - 20 mg/dL Final  . Creatinine, Ser 11/01/2016 0.95  0.61 - 1.24 mg/dL Final  . Calcium 11/01/2016 9.0  8.9 - 10.3 mg/dL Final  . Total Protein 11/01/2016 7.7  6.5 - 8.1 g/dL Final  . Albumin 11/01/2016 3.7  3.5 - 5.0 g/dL Final  . AST 11/01/2016 31  15 - 41 U/L Final  . ALT 11/01/2016 25  17 - 63 U/L Final  . Alkaline Phosphatase 11/01/2016 63  38 - 126 U/L Final  . Total Bilirubin 11/01/2016 1.1  0.3 - 1.2 mg/dL Final  . GFR calc non Af Amer 11/01/2016 >60  >60 mL/min Final  . GFR calc Af Amer 11/01/2016 >60  >60 mL/min Final   Comment: (NOTE) The eGFR has been calculated using the CKD EPI equation. This calculation has not been validated in all clinical situations. eGFR's persistently <60 mL/min signify possible Chronic Kidney Disease.   . Anion gap 11/01/2016 6  5 - 15 Final  . WBC 11/01/2016 13.7* 3.8 - 10.6 K/uL Final  . RBC 11/01/2016 4.22* 4.40 - 5.90 MIL/uL Final  . Hemoglobin 11/01/2016 13.2  13.0 - 18.0 g/dL Final  . HCT 11/01/2016 39.4* 40.0 - 52.0 % Final  . MCV 11/01/2016 93.3  80.0 - 100.0 fL Final  . MCH 11/01/2016 31.2  26.0 - 34.0 pg Final  . MCHC 11/01/2016 33.4  32.0  - 36.0 g/dL Final  . RDW 11/01/2016 12.8  11.5 - 14.5 % Final  . Platelets 11/01/2016 204  150 - 440 K/uL Final  . Neutrophils Relative % 11/01/2016 81  % Final  . Neutro Abs 11/01/2016 11.3* 1.4 - 6.5 K/uL Final  . Lymphocytes Relative 11/01/2016 10  % Final  . Lymphs Abs 11/01/2016 1.4  1.0 - 3.6 K/uL Final  . Monocytes Relative 11/01/2016 7  % Final  . Monocytes Absolute 11/01/2016 0.9  0.2 - 1.0 K/uL Final  . Eosinophils Relative 11/01/2016 1  % Final  . Eosinophils Absolute 11/01/2016 0.1  0 - 0.7 K/uL Final  . Basophils Relative 11/01/2016 1  % Final  . Basophils Absolute 11/01/2016 0.1  0 - 0.1 K/uL Final    Assessment:  Gevon   E Fadden is a 76 y.o. male with stage I colon cancer s/p transverse colectomy on 10/17/2013.  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  Tumor extended into the submucosa. Margins were negative. There was lymphovascular invasion. 14 lymph nodes were negative. Pathologic stage was T1 N0.  Colonoscopy on 10/02/2014 noted several 3 mm polyps and an 8 mm polyp in the cecum and transverse colon.  Pathology revealed tubular adenomas negative for high-grade dysplasia and malignancy.    CEA has been followed: 3.1 on 04/27/2014, 2.3 on 11/11/2014, 3.0 on 05/12/2015, 2.7 on 11/10/2015 and 3.2 on 05/10/2016.  Symptomatically, he feel good except for his knees.  Exam is stable.    Plan: 1.  Labs today:  CBC with diff, CMP, CEA. 2.  Anticipate next colonoscopy in 02/16/2017 ([facility]). 3.  RTC in 6 months for MD assess and labs (CBC with diff, CMP, CEA).    Melissa C Corcoran, MD  11/01/2016, 10:38 AM 

## 2016-11-01 ENCOUNTER — Encounter: Payer: Self-pay | Admitting: Hematology and Oncology

## 2016-11-01 ENCOUNTER — Encounter: Payer: Self-pay | Admitting: *Deleted

## 2016-11-01 ENCOUNTER — Inpatient Hospital Stay: Payer: Medicare Other

## 2016-11-01 ENCOUNTER — Inpatient Hospital Stay: Payer: Medicare Other | Attending: Hematology and Oncology | Admitting: Hematology and Oncology

## 2016-11-01 ENCOUNTER — Emergency Department
Admission: EM | Admit: 2016-11-01 | Discharge: 2016-11-01 | Disposition: A | Discharge status: Home / Self Care | Payer: Medicare Other | Attending: Emergency Medicine | Admitting: Emergency Medicine

## 2016-11-01 VITALS — BP 151/78 | HR 93 | Temp 96.6°F | Resp 18 | Wt 271.6 lb

## 2016-11-01 DIAGNOSIS — I10 Essential (primary) hypertension: Secondary | ICD-10-CM | POA: Diagnosis not present

## 2016-11-01 DIAGNOSIS — Z85038 Personal history of other malignant neoplasm of large intestine: Secondary | ICD-10-CM | POA: Diagnosis not present

## 2016-11-01 DIAGNOSIS — Z7982 Long term (current) use of aspirin: Secondary | ICD-10-CM | POA: Insufficient documentation

## 2016-11-01 DIAGNOSIS — N3000 Acute cystitis without hematuria: Secondary | ICD-10-CM | POA: Diagnosis not present

## 2016-11-01 DIAGNOSIS — R300 Dysuria: Secondary | ICD-10-CM | POA: Diagnosis not present

## 2016-11-01 DIAGNOSIS — D120 Benign neoplasm of cecum: Secondary | ICD-10-CM | POA: Diagnosis not present

## 2016-11-01 DIAGNOSIS — M1990 Unspecified osteoarthritis, unspecified site: Secondary | ICD-10-CM | POA: Diagnosis not present

## 2016-11-01 DIAGNOSIS — Z79899 Other long term (current) drug therapy: Secondary | ICD-10-CM | POA: Diagnosis not present

## 2016-11-01 DIAGNOSIS — R350 Frequency of micturition: Secondary | ICD-10-CM | POA: Diagnosis present

## 2016-11-01 DIAGNOSIS — Z96653 Presence of artificial knee joint, bilateral: Secondary | ICD-10-CM | POA: Insufficient documentation

## 2016-11-01 LAB — COMPREHENSIVE METABOLIC PANEL
ALT: 25 U/L (ref 17–63)
AST: 31 U/L (ref 15–41)
Albumin: 3.7 g/dL (ref 3.5–5.0)
Alkaline Phosphatase: 63 U/L (ref 38–126)
Anion gap: 6 (ref 5–15)
BUN: 15 mg/dL (ref 6–20)
CHLORIDE: 100 mmol/L — AB (ref 101–111)
CO2: 29 mmol/L (ref 22–32)
Calcium: 9 mg/dL (ref 8.9–10.3)
Creatinine, Ser: 0.95 mg/dL (ref 0.61–1.24)
GFR calc Af Amer: >60 mL/min (ref >60)
GFR calc non Af Amer: >60 mL/min (ref >60)
GLUCOSE: 103 mg/dL — AB (ref 65–99)
Potassium: 3.6 mmol/L (ref 3.5–5.1)
SODIUM: 135 mmol/L (ref 135–145)
Total Bilirubin: 1.1 mg/dL (ref 0.3–1.2)
Total Protein: 7.7 g/dL (ref 6.5–8.1)

## 2016-11-01 LAB — URINALYSIS, COMPLETE (UACMP) WITH MICROSCOPIC
Bilirubin Urine: NEGATIVE
Glucose, UA: NEGATIVE mg/dL
Ketones, ur: NEGATIVE mg/dL
Nitrite: NEGATIVE
Protein, ur: NEGATIVE mg/dL
SPECIFIC GRAVITY, URINE: 1.015 (ref 1.005–1.030)
pH: 6.5 (ref 5.0–8.0)

## 2016-11-01 LAB — CBC WITH DIFFERENTIAL/PLATELET
Basophils Absolute: 0.1 10*3/uL (ref 0–0.1)
Basophils Relative: 1 %
EOS ABS: 0.1 10*3/uL (ref 0–0.7)
EOS PCT: 1 %
HCT: 39.4 % — ABNORMAL LOW (ref 40.0–52.0)
HEMOGLOBIN: 13.2 g/dL (ref 13.0–18.0)
LYMPHS ABS: 1.4 10*3/uL (ref 1.0–3.6)
Lymphocytes Relative: 10 %
MCH: 31.2 pg (ref 26.0–34.0)
MCHC: 33.4 g/dL (ref 32.0–36.0)
MCV: 93.3 fL (ref 80.0–100.0)
MONOS PCT: 7 %
Monocytes Absolute: 0.9 10*3/uL (ref 0.2–1.0)
Neutro Abs: 11.3 10*3/uL — ABNORMAL HIGH (ref 1.4–6.5)
Neutrophils Relative %: 81 %
Platelets: 204 10*3/uL (ref 150–440)
RBC: 4.22 MIL/uL — ABNORMAL LOW (ref 4.40–5.90)
RDW: 12.8 % (ref 11.5–14.5)
WBC: 13.7 10*3/uL — ABNORMAL HIGH (ref 3.8–10.6)

## 2016-11-01 MED ORDER — SULFAMETHOXAZOLE-TRIMETHOPRIM 800-160 MG PO TABS
1.0000 | ORAL_TABLET | Freq: Two times a day (BID) | ORAL | 0 refills | Status: DC
Start: 2016-11-01 — End: 2017-04-25

## 2016-11-01 NOTE — Progress Notes (Signed)
Patient states he is having pain in his left elbow.  (Swollen and inflammed).  Temp 99.6.  Also c/o knee pain.  Otherwise no complaints.  States he got dehydrated a few days ago.

## 2016-11-01 NOTE — ED Notes (Signed)
Pt reports that he is unable to hold his urine for the last week - reports pain with urination and increased frequency

## 2016-11-01 NOTE — ED Provider Notes (Signed)
[facility] Emergency Department Provider Note  ____________________________________________  Time seen: Approximately 9:01 PM  I have reviewed the triage vital signs and the nursing notes.   HISTORY  Chief Complaint Urinary Frequency    HPI Jared Gutierrez is a 76 y.o. male that presents to the emergency department with dysuria and urgency for one week. He has never had a urinary tract infection before. No alleviating measures have been attempted. He denies mental status changes, fever, shortness of breath, chest pain, nausea, vomiting, abdominal pain, back pain, penile discharge.   Past Medical History:  Diagnosis Date  . Arthritis   . Cancer ([place])   . Cavitary lesion of lung   . Colon cancer ([place])   . Hypertension   . Nephrolithiasis     Patient Active Problem List   Diagnosis Date Noted  . Hypertension 05/10/2016  . History of colon cancer, stage I 10/17/2013    Past Surgical History:  Procedure Laterality Date  . COLONOSCOPY N/A 10/02/2014   Procedure: COLONOSCOPY;  Surgeon: Josefine Class, MD;  Location: [facility] ENDOSCOPY;  Service: Endoscopy;  Laterality: N/A;    Prior to Admission medications   Medication Sig Start Date End Date Taking? Authorizing Provider  aspirin 81 MG tablet Take 81 mg by mouth daily.    [provider]  ferrous sulfate 325 (65 FE) MG EC tablet Take by mouth. 02/03/15 05/10/16  [provider]  glucosamine-chondroitin 500-400 MG tablet Take 1 tablet by mouth daily.    [provider]  losartan-hydrochlorothiazide (HYZAAR) 50-12.5 MG per tablet Take 1 tablet by mouth daily.    [provider]  oxybutynin (DITROPAN) 5 MG tablet Take 5 mg by mouth 3 (three) times daily.    [provider]  Probiotic Product (PROBIOTIC DAILY PO) Take 1 tablet by mouth daily.    [provider]  sulfamethoxazole-trimethoprim (BACTRIM DS,SEPTRA DS) 800-160 MG tablet Take 1 tablet by  mouth 2 (two) times daily. 11/01/16   Laban Emperor, PA-C    Allergies Patient has no known allergies.  No family history on file.  Social History Social History  Substance Use Topics  . Smoking status: Never Smoker  . Smokeless tobacco: Never Used  . Alcohol use No     Review of Systems  Constitutional: No fever/chills Cardiovascular: No chest pain. Respiratory:  No SOB. Gastrointestinal: No abdominal pain.  No nausea, no vomiting.  Musculoskeletal: Negative for musculoskeletal pain. Skin: Negative for rash, abrasions, lacerations, ecchymosis.   ____________________________________________   PHYSICAL EXAM:  VITAL SIGNS: ED Triage Vitals  Enc Vitals Group     BP 11/01/16 1828 (!) 142/67     Pulse Rate 11/01/16 1828 100     Resp --      Temp 11/01/16 1828 98.1 F (36.7 C)     Temp Source 11/01/16 1828 Oral     SpO2 11/01/16 1828 96 %     Weight 11/01/16 1828 270 lb (122.5 kg)     Height 11/01/16 1828 5\' 8"  (1.727 m)     Head Circumference --      Peak Flow --      Pain Score 11/01/16 1831 4     Pain Loc --      Pain Edu? --      Excl. in [place]? --      Constitutional: Alert and oriented. Well appearing and in no acute distress. Eyes: Conjunctivae are normal. PERRL. EOMI. Head: Atraumatic. ENT:      Ears:  Nose: No congestion/rhinnorhea.      Mouth/Throat: Mucous membranes are moist.  Neck: No stridor. Cardiovascular: Normal rate, regular rhythm.  Good peripheral circulation. Respiratory: Normal respiratory effort without tachypnea or retractions. Lungs CTAB. Good air entry to the bases with no decreased or absent breath sounds. Gastrointestinal: Bowel sounds 4 quadrants. Soft and nontender to palpation. No guarding or rigidity. No palpable masses. No distention. No CVA tenderness. Genitourinary: RN present for prostate exam. No tenderness to palpation of prostate. Musculoskeletal: Full range of motion to all extremities. No gross deformities  appreciated. Neurologic:  Normal speech and language. No gross focal neurologic deficits are appreciated.  Skin:  Skin is warm, dry and intact. No rash noted.   ____________________________________________   LABS (all labs ordered are listed, but only abnormal results are displayed)  Labs Reviewed  URINALYSIS, COMPLETE (UACMP) WITH MICROSCOPIC - Abnormal; Notable for the following:       Result Value   APPearance CLOUDY (*)    Hgb urine dipstick TRACE (*)    Leukocytes, UA MODERATE (*)    Squamous Epithelial / LPF 0-5 (*)    Bacteria, UA RARE (*)    All other components within normal limits  URINE CULTURE   ____________________________________________  EKG   ____________________________________________  RADIOLOGY  No results found.  ____________________________________________    PROCEDURES  Procedure(s) performed:    Procedures    Medications - No data to display   ____________________________________________   INITIAL IMPRESSION / ASSESSMENT AND PLAN / ED COURSE  Pertinent labs & imaging results that were available during my care of the patient were reviewed by me and considered in my medical decision making (see chart for details).  Review of the  CSRS was performed in accordance of the [place] prior to dispensing any controlled drugs.  Patient's diagnosis is consistent with urinary tract infection. Vital signs and exam are reassuring. Urinalysis consistent with infection. No indication of prostatitis. Patient will be discharged home with prescriptions for Bactrim. Patient is to follow up with PCP as directed. Patient is given ED precautions to return to the ED for any worsening or new symptoms.     ____________________________________________  FINAL CLINICAL IMPRESSION(S) / ED DIAGNOSES  Final diagnoses:  Acute cystitis without hematuria      NEW MEDICATIONS STARTED DURING THIS VISIT:  Discharge Medication List as of 11/01/2016  9:02 PM     START taking these medications   Details  sulfamethoxazole-trimethoprim (BACTRIM DS,SEPTRA DS) 800-160 MG tablet Take 1 tablet by mouth 2 (two) times daily., Starting Wed 11/01/2016, Print            This chart was dictated using voice recognition software/Dragon. Despite best efforts to proofread, errors can occur which can change the meaning. Any change was purely unintentional.    Laban Emperor, PA-C 11/01/16 2347    Harvest Dark, MD 11/02/16 0000

## 2016-11-01 NOTE — ED Triage Notes (Signed)
Pt repoert urinary frequency and dysuria.  Sx for 1 week.  No back pain. No abd pain.  Pt alert

## 2016-11-02 LAB — CEA: CEA: 2.8 ng/mL (ref 0.0–4.7)

## 2016-11-04 LAB — URINE CULTURE: Culture: >=100000 — AB

## 2016-11-05 NOTE — Progress Notes (Signed)
76 y/o M d/c from ED on Bactrim for cystitis. Urine culture resulted with ESBL E coli resistant to Bactrim. Dr. Lisa Roca authorized a prescription for Macrobid 100 mg BID x 7 days. Spoke to patient's spouse and called in prescription to [facility] in [place], PharmD Clinical Pharmacist

## 2016-11-08 ENCOUNTER — Other Ambulatory Visit: Payer: Medicare Other

## 2016-11-08 ENCOUNTER — Ambulatory Visit: Payer: Medicare Other | Admitting: Hematology and Oncology

## 2017-02-16 ENCOUNTER — Encounter
Admission: RE | Disposition: A | Discharge status: Home / Self Care | Payer: Self-pay | Source: Ambulatory Visit | Attending: Gastroenterology

## 2017-02-16 ENCOUNTER — Ambulatory Visit: Payer: Medicare Other | Admitting: Anesthesiology

## 2017-02-16 ENCOUNTER — Ambulatory Visit
Admission: RE | Admit: 2017-02-16 | Discharge: 2017-02-16 | Disposition: A | Discharge status: Home / Self Care | Payer: Medicare Other | Source: Ambulatory Visit | Attending: Gastroenterology | Admitting: Gastroenterology

## 2017-02-16 DIAGNOSIS — Z1211 Encounter for screening for malignant neoplasm of colon: Secondary | ICD-10-CM | POA: Insufficient documentation

## 2017-02-16 DIAGNOSIS — E669 Obesity, unspecified: Secondary | ICD-10-CM | POA: Diagnosis not present

## 2017-02-16 DIAGNOSIS — Z7982 Long term (current) use of aspirin: Secondary | ICD-10-CM | POA: Insufficient documentation

## 2017-02-16 DIAGNOSIS — Z85038 Personal history of other malignant neoplasm of large intestine: Secondary | ICD-10-CM | POA: Diagnosis not present

## 2017-02-16 DIAGNOSIS — K640 First degree hemorrhoids: Secondary | ICD-10-CM | POA: Diagnosis not present

## 2017-02-16 DIAGNOSIS — Z87442 Personal history of urinary calculi: Secondary | ICD-10-CM | POA: Diagnosis not present

## 2017-02-16 DIAGNOSIS — I10 Essential (primary) hypertension: Secondary | ICD-10-CM | POA: Diagnosis not present

## 2017-02-16 DIAGNOSIS — D124 Benign neoplasm of descending colon: Secondary | ICD-10-CM | POA: Diagnosis not present

## 2017-02-16 DIAGNOSIS — Z86010 Personal history of colonic polyps: Secondary | ICD-10-CM | POA: Insufficient documentation

## 2017-02-16 DIAGNOSIS — M1990 Unspecified osteoarthritis, unspecified site: Secondary | ICD-10-CM | POA: Insufficient documentation

## 2017-02-16 DIAGNOSIS — Z6841 Body Mass Index (BMI) 40.0 and over, adult: Secondary | ICD-10-CM | POA: Diagnosis not present

## 2017-02-16 DIAGNOSIS — Z79899 Other long term (current) drug therapy: Secondary | ICD-10-CM | POA: Diagnosis not present

## 2017-02-16 HISTORY — PX: COLONOSCOPY WITH PROPOFOL: SHX5780

## 2017-02-16 HISTORY — DX: Zoster without complications: B02.9

## 2017-02-16 HISTORY — DX: Benign neoplasm of colon, unspecified: D12.6

## 2017-02-16 HISTORY — DX: Varicella without complication: B01.9

## 2017-02-16 HISTORY — DX: Obesity, unspecified: E66.9

## 2017-02-16 HISTORY — DX: Benign lipomatous neoplasm of intra-abdominal organs: D17.5

## 2017-02-16 SURGERY — COLONOSCOPY WITH PROPOFOL
Anesthesia: General

## 2017-02-16 MED ORDER — MIDAZOLAM HCL 2 MG/2ML IJ SOLN
INTRAMUSCULAR | Status: AC
Start: 2017-02-16 — End: 2017-02-16
  Filled 2017-02-16: qty 2

## 2017-02-16 MED ORDER — LIDOCAINE HCL (PF) 2 % IJ SOLN
INTRAMUSCULAR | Status: DC | PRN
  Administered 2017-02-16: 80 mg

## 2017-02-16 MED ORDER — MIDAZOLAM HCL 5 MG/5ML IJ SOLN
INTRAMUSCULAR | Status: DC | PRN
  Administered 2017-02-16: 1 mg via INTRAVENOUS

## 2017-02-16 MED ORDER — PROPOFOL 500 MG/50ML IV EMUL
INTRAVENOUS | Status: DC | PRN
  Administered 2017-02-16: 75 ug/kg/min via INTRAVENOUS

## 2017-02-16 MED ORDER — EPHEDRINE SULFATE 50 MG/ML IJ SOLN
INTRAMUSCULAR | Status: AC
  Filled 2017-02-16: qty 1

## 2017-02-16 MED ORDER — SODIUM CHLORIDE 0.9 % IV SOLN
INTRAVENOUS | Status: DC
  Administered 2017-02-16: 09:00:00 via INTRAVENOUS

## 2017-02-16 MED ORDER — PROPOFOL 500 MG/50ML IV EMUL
INTRAVENOUS | Status: AC
  Filled 2017-02-16: qty 50

## 2017-02-16 MED ORDER — FENTANYL CITRATE (PF) 100 MCG/2ML IJ SOLN
INTRAMUSCULAR | Status: AC
  Filled 2017-02-16: qty 2

## 2017-02-16 MED ORDER — SODIUM CHLORIDE 0.9 % IV SOLN: INTRAVENOUS | Status: DC

## 2017-02-16 MED ORDER — PROPOFOL 10 MG/ML IV BOLUS
INTRAVENOUS | Status: DC | PRN
Start: 2017-02-16 — End: 2017-02-16
  Administered 2017-02-16: 30 mg via INTRAVENOUS

## 2017-02-16 MED ORDER — LIDOCAINE HCL (PF) 2 % IJ SOLN
INTRAMUSCULAR | Status: AC
  Filled 2017-02-16: qty 10

## 2017-02-16 MED ORDER — EPHEDRINE SULFATE 50 MG/ML IJ SOLN
INTRAMUSCULAR | Status: DC | PRN
  Administered 2017-02-16: 10 mg via INTRAVENOUS
  Administered 2017-02-16: 15 mg via INTRAVENOUS
  Administered 2017-02-16: 10 mg via INTRAVENOUS

## 2017-02-16 MED ORDER — FENTANYL CITRATE (PF) 100 MCG/2ML IJ SOLN
INTRAMUSCULAR | Status: DC | PRN
  Administered 2017-02-16: 50 ug via INTRAVENOUS

## 2017-02-16 NOTE — Transfer of Care (Signed)
Immediate Anesthesia Transfer of Care Note  Patient: Jared Gutierrez  Procedure(s) Performed: COLONOSCOPY WITH PROPOFOL (N/A )  Patient Location: PACU  Anesthesia Type:General  Level of Consciousness: sedated  Airway & Oxygen Therapy: Patient Spontanous Breathing and Patient connected to nasal cannula oxygen  Post-op Assessment: Report given to RN and Post -op Vital signs reviewed and stable  Post vital signs: Reviewed  Last Vitals:  Vitals:   02/16/17 0848  BP: 138/66  Pulse: 62  Resp: 18  Temp: 36.6 C  SpO2: 99%    Last Pain:  Vitals:   02/16/17 0848  TempSrc: Tympanic         Complications: No apparent anesthesia complications

## 2017-02-16 NOTE — Anesthesia Preprocedure Evaluation (Signed)
Anesthesia Evaluation  Patient identified by MRN, date of birth, ID band Patient awake    Reviewed: Allergy & Precautions, NPO status , Patient's Chart, lab work & pertinent test results  History of Anesthesia Complications Negative for: history of anesthetic complications  Airway Mallampati: III       Dental  (+) Partial Lower, Partial Upper   Pulmonary neg sleep apnea, neg COPD,           Cardiovascular hypertension, Pt. on medications (-) Past MI and (-) CHF (-) dysrhythmias (-) Valvular Problems/Murmurs     Neuro/Psych neg Seizures    GI/Hepatic Neg liver ROS, neg GERD  ,  Endo/Other  neg diabetes  Renal/GU Renal disease (stones)     Musculoskeletal   Abdominal   Peds  Hematology   Anesthesia Other Findings   Reproductive/Obstetrics                             Anesthesia Physical Anesthesia Plan  ASA: II  Anesthesia Plan: General   Post-op Pain Management:    Induction: Intravenous  PONV Risk Score and Plan: 2 and Ondansetron, Dexamethasone and Propofol infusion  Airway Management Planned: Nasal Cannula  Additional Equipment:   Intra-op Plan:   Post-operative Plan:   Informed Consent: I have reviewed the patients History and Physical, chart, labs and discussed the procedure including the risks, benefits and alternatives for the proposed anesthesia with the patient or authorized representative who has indicated his/her understanding and acceptance.     Plan Discussed with:   Anesthesia Plan Comments:         Anesthesia Quick Evaluation

## 2017-02-16 NOTE — H&P (Signed)
Primary Care Physician:  Tracie Harrier, MD Primary Gastroenterologist:  Dr. Vira Agar  Pre-Procedure History & Physical: HPI:  Jared Gutierrez is a 76 y.o. male is here for an colonoscopy.   Past Medical History:  Diagnosis Date  . Arthritis   . Cancer ([place])   . Cavitary lesion of lung   . Chicken pox   . Colon cancer ([place])   . Hypertension   . Lipoma of colon   . Nephrolithiasis   . Nephrolithiasis   . Obesity   . Shingles   . Tubular adenoma of colon    multiple fragments    Past Surgical History:  Procedure Laterality Date  . COLONOSCOPY N/A 10/02/2014   Procedure: COLONOSCOPY;  Surgeon: Josefine Class, MD;  Location: [facility] ENDOSCOPY;  Service: Endoscopy;  Laterality: N/A;    Prior to Admission medications   Medication Sig Start Date End Date Taking? Authorizing Provider  aspirin 81 MG tablet Take 81 mg by mouth daily.   Yes [provider]  losartan-hydrochlorothiazide (HYZAAR) 50-12.5 MG per tablet Take 1 tablet by mouth daily.   Yes [provider]  meloxicam (MOBIC) 15 MG tablet Take 15 mg by mouth daily.   Yes [provider]  ferrous sulfate 325 (65 FE) MG EC tablet Take by mouth. 02/03/15 05/10/16  [provider]  glucosamine-chondroitin 500-400 MG tablet Take 1 tablet by mouth daily.    [provider]  oxybutynin (DITROPAN) 5 MG tablet Take 5 mg by mouth 3 (three) times daily.    [provider]  Probiotic Product (PROBIOTIC DAILY PO) Take 1 tablet by mouth daily.    [provider]  sulfamethoxazole-trimethoprim (BACTRIM DS,SEPTRA DS) 800-160 MG tablet Take 1 tablet by mouth 2 (two) times daily. Patient not taking: Reported on 02/16/2017 11/01/16   Laban Emperor, PA-C    Allergies as of 10/18/2016  . (No Known Allergies)    Family History  Problem Relation Age of Onset  . Cancer Mother   . Breast cancer Mother   . COPD Father     Social History   Social History  . Marital  status: Married    Spouse name: N/A  . Number of children: N/A  . Years of education: N/A   Occupational History  . Not on file.   Social History Main Topics  . Smoking status: Never Smoker  . Smokeless tobacco: Never Used  . Alcohol use Yes     Comment: socially   . Drug use: No  . Sexual activity: Not on file   Other Topics Concern  . Not on file   Social History Narrative  . No narrative on file    Review of Systems: See HPI, otherwise negative ROS  Physical Exam: BP 138/66   Pulse 62   Temp 97.8 F (36.6 C) (Tympanic)   Resp 18   Ht 5\' 8"  (1.727 m)   Wt 122.9 kg (271 lb)   SpO2 99%   BMI 41.21 kg/m  General:   Alert,  pleasant and cooperative in NAD Head:  Normocephalic and atraumatic. Neck:  Supple; no masses or thyromegaly. Lungs:  Clear throughout to auscultation.    Heart:  Regular rate and rhythm. Abdomen:  Soft, nontender and nondistended. Normal bowel sounds, without guarding, and without rebound.   Neurologic:  Alert and  oriented x4;  grossly normal neurologically.  Impression/Plan: Jared Gutierrez is here for an colonoscopy to be performed for [facility] colon cancer with previous colon resection.  Risks, benefits, limitations, and alternatives regarding  colonoscopy have been reviewed with the patient.  Questions have been answered.  All parties agreeable.   Gaylyn Cheers, MD  02/16/2017, 9:51 AM

## 2017-02-16 NOTE — Anesthesia Post-op Follow-up Note (Signed)
Anesthesia QCDR form completed.        

## 2017-02-16 NOTE — Anesthesia Postprocedure Evaluation (Signed)
Anesthesia Post Note  Patient: Jared Gutierrez  Procedure(s) Performed: COLONOSCOPY WITH PROPOFOL (N/A )  Patient location during evaluation: Endoscopy Anesthesia Type: General Level of consciousness: awake and alert Pain management: pain level controlled Vital Signs Assessment: post-procedure vital signs reviewed and stable Respiratory status: spontaneous breathing and respiratory function stable Cardiovascular status: stable Anesthetic complications: no     Last Vitals:  Vitals:   02/16/17 0848  BP: 138/66  Pulse: 62  Resp: 18  Temp: 36.6 C  SpO2: 99%    Last Pain:  Vitals:   02/16/17 0848  TempSrc: Tympanic                 KEPHART,WILLIAM K

## 2017-02-16 NOTE — Op Note (Signed)
[facility] Gastroenterology Patient Name: Jared Gutierrez Procedure Date: 02/16/2017 9:32 AM MRN: [id number] Account #: [id number] Date of Birth: [birth year] Admit Type: Outpatient Age: 76 Room: [facility] ENDO ROOM 1 Gender: Male Note Status: Finalized Procedure:            Colonoscopy Indications:          High risk colon cancer surveillance: Personal history                        of colon cancer Providers:            Manya Silvas, MD Referring MD:         Tracie Harrier, MD (Referring MD) Medicines:            Propofol per Anesthesia Complications:        No immediate complications. Procedure:            Pre-Anesthesia Assessment:                       - After reviewing the risks and benefits, the patient                        was deemed in satisfactory condition to undergo the                        procedure.                       After obtaining informed consent, the colonoscope was                        passed under direct vision. Throughout the procedure,                        the patient's blood pressure, pulse, and oxygen                        saturations were monitored continuously. The                        Colonoscope was introduced through the anus and                        advanced to the the cecum, identified by appendiceal                        orifice and ileocecal valve. The colonoscopy was                        performed without difficulty. The patient tolerated the                        procedure well. The quality of the bowel preparation                        was excellent. Findings:      A diminutive polyp was found in the descending colon. The polyp was       sessile. The polyp was removed with a jumbo cold forceps. Resection and       retrieval were complete.      A diminutive polyp was found  in the descending colon. The polyp was       sessile. The polyp was removed with a jumbo cold forceps. Resection and       retrieval were  complete.      Internal hemorrhoids were found during endoscopy. The hemorrhoids were       small and Grade I (internal hemorrhoids that do not prolapse).      The exam was otherwise without abnormality. Impression:           - One diminutive polyp in the descending colon, removed                        with a jumbo cold forceps. Resected and retrieved.                       - One diminutive polyp in the descending colon, removed                        with a jumbo cold forceps. Resected and retrieved.                       - Internal hemorrhoids.                       - The examination was otherwise normal. Recommendation:       - Await pathology results. Manya Silvas, MD 02/16/2017 10:22:13 AM This report has been signed electronically. Number of Addenda: 0 Note Initiated On: 02/16/2017 9:32 AM Scope Withdrawal Time: 0 hours 10 minutes 41 seconds  Total Procedure Duration: 0 hours 22 minutes 26 seconds       [facility]

## 2017-02-19 ENCOUNTER — Encounter: Payer: Self-pay | Admitting: Unknown Physician Specialty

## 2017-02-19 LAB — SURGICAL PATHOLOGY

## 2017-04-25 ENCOUNTER — Encounter: Payer: Self-pay | Admitting: Hematology and Oncology

## 2017-04-25 ENCOUNTER — Inpatient Hospital Stay: Payer: Medicare Other | Attending: Hematology and Oncology | Admitting: Hematology and Oncology

## 2017-04-25 ENCOUNTER — Inpatient Hospital Stay: Payer: Medicare Other

## 2017-04-25 VITALS — BP 138/81 | HR 71 | Temp 98.7°F | Resp 20 | Wt 274.0 lb

## 2017-04-25 DIAGNOSIS — C184 Malignant neoplasm of transverse colon: Secondary | ICD-10-CM | POA: Diagnosis not present

## 2017-04-25 DIAGNOSIS — Z85038 Personal history of other malignant neoplasm of large intestine: Secondary | ICD-10-CM

## 2017-04-25 DIAGNOSIS — Z96653 Presence of artificial knee joint, bilateral: Secondary | ICD-10-CM | POA: Diagnosis not present

## 2017-04-25 DIAGNOSIS — I10 Essential (primary) hypertension: Secondary | ICD-10-CM | POA: Insufficient documentation

## 2017-04-25 DIAGNOSIS — E669 Obesity, unspecified: Secondary | ICD-10-CM | POA: Diagnosis not present

## 2017-04-25 DIAGNOSIS — Z9049 Acquired absence of other specified parts of digestive tract: Secondary | ICD-10-CM | POA: Insufficient documentation

## 2017-04-25 DIAGNOSIS — Z7982 Long term (current) use of aspirin: Secondary | ICD-10-CM | POA: Insufficient documentation

## 2017-04-25 DIAGNOSIS — Z79899 Other long term (current) drug therapy: Secondary | ICD-10-CM | POA: Insufficient documentation

## 2017-04-25 DIAGNOSIS — M170 Bilateral primary osteoarthritis of knee: Secondary | ICD-10-CM | POA: Diagnosis not present

## 2017-04-25 DIAGNOSIS — D120 Benign neoplasm of cecum: Secondary | ICD-10-CM

## 2017-04-25 LAB — COMPREHENSIVE METABOLIC PANEL
ALT: 26 U/L (ref 17–63)
AST: 31 U/L (ref 15–41)
Albumin: 3.8 g/dL (ref 3.5–5.0)
Alkaline Phosphatase: 70 U/L (ref 38–126)
Anion gap: 6 (ref 5–15)
BUN: 13 mg/dL (ref 6–20)
CO2: 30 mmol/L (ref 22–32)
Calcium: 9.3 mg/dL (ref 8.9–10.3)
Chloride: 100 mmol/L — ABNORMAL LOW (ref 101–111)
Creatinine, Ser: 0.78 mg/dL (ref 0.61–1.24)
GFR calc Af Amer: >60 mL/min (ref >60)
GFR calc non Af Amer: >60 mL/min (ref >60)
Glucose, Bld: 111 mg/dL — ABNORMAL HIGH (ref 65–99)
Potassium: 4.1 mmol/L (ref 3.5–5.1)
Sodium: 136 mmol/L (ref 135–145)
Total Bilirubin: 0.7 mg/dL (ref 0.3–1.2)
Total Protein: 7.6 g/dL (ref 6.5–8.1)

## 2017-04-25 LAB — CBC WITH DIFFERENTIAL/PLATELET
Basophils Absolute: 0 10*3/uL (ref 0–0.1)
Basophils Relative: 1 %
Eosinophils Absolute: 0.2 10*3/uL (ref 0–0.7)
Eosinophils Relative: 3 %
HCT: 42.4 % (ref 40.0–52.0)
Hemoglobin: 14.4 g/dL (ref 13.0–18.0)
Lymphocytes Relative: 20 %
Lymphs Abs: 1.7 10*3/uL (ref 1.0–3.6)
MCH: 31.5 pg (ref 26.0–34.0)
MCHC: 33.9 g/dL (ref 32.0–36.0)
MCV: 93 fL (ref 80.0–100.0)
Monocytes Absolute: 0.7 10*3/uL (ref 0.2–1.0)
Monocytes Relative: 8 %
Neutro Abs: 6.1 10*3/uL (ref 1.4–6.5)
Neutrophils Relative %: 70 %
Platelets: 219 10*3/uL (ref 150–440)
RBC: 4.56 MIL/uL (ref 4.40–5.90)
RDW: 12.8 % (ref 11.5–14.5)
WBC: 8.8 10*3/uL (ref 3.8–10.6)

## 2017-04-25 NOTE — Progress Notes (Signed)
Patient offers no complaints today. 

## 2017-04-25 NOTE — Progress Notes (Signed)
[facility] day:  04/25/17  Chief Complaint: Jared Gutierrez is a 76 y.o. male with stage I colon cancer who is seen for 6 month assessment.  HPI: The patient was last seen in the medical oncology clinic by me on 11/01/2016.  At that time, he felt good except for his knees.  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 Exam was stable.  CBC, CMP, and CEA were normal.  Colonoscopy on 02/16/2017 by Dr Vira Agar revealed 2 diminutive polyps in the descending colon, removed with a jumbo cold forceps. Pathology revealed tubular adenomas without dysplasia or malignancy.  During the interim, patient has been doing "pretty fair and good". Patient has had a slight non-productive cough for the last week. He denies bleeding; no hematochezia, melena, or gross hematuria. Patient has not experienced any B symptoms or interval infections. He is eating well with no demonstrated weight loss.    Past Medical History:  Diagnosis Date  . Arthritis   . Cancer ([place])   . Cavitary lesion of lung   . Chicken pox   . Colon cancer ([place])   . Hypertension   . Lipoma of colon   . Nephrolithiasis   . Nephrolithiasis   . Obesity   . Shingles   . Tubular adenoma of colon    multiple fragments    Past Surgical History:  Procedure Laterality Date  . COLONOSCOPY N/A 10/02/2014   Procedure: COLONOSCOPY;  Surgeon: Josefine Class, MD;  Location: [facility] ENDOSCOPY;  Service: Endoscopy;  Laterality: N/A;  . COLONOSCOPY WITH PROPOFOL N/A 02/16/2017   Procedure: COLONOSCOPY WITH PROPOFOL;  Surgeon: Manya Silvas, MD;  Location[facility] ENDOSCOPY;  Service: Endoscopy;  Laterality: N/A;    Family History  Problem Relation Age of Onset  . Cancer Mother   . Breast cancer Mother   . COPD Father     Social History:  reports that  has never smoked. he has never used smokeless tobacco. He reports that he drinks  alcohol. He reports that he does not use drugs.  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  The patient is alone today.  Allergies: No Known Allergies  Current Medications: Current Outpatient Medications  Medication Sig Dispense Refill  . aspirin 81 MG tablet Take 81 mg by mouth daily.    . ferrous sulfate 325 (65 FE) MG EC tablet Take by mouth.    [name] glucosamine-chondroitin 500-400 MG tablet Take 1 tablet by mouth daily.    [name] losartan-hydrochlorothiazide (HYZAAR) 50-12.5 MG per tablet Take 1 tablet by mouth daily.    . meloxicam (MOBIC) 15 MG tablet Take 15 mg by mouth daily.    [name] oxybutynin (DITROPAN) 5 MG tablet Take 5 mg by mouth 3 (three) times daily.    . Probiotic Product (PROBIOTIC DAILY PO) Take 1 tablet by mouth daily.     No current facility-administered medications for this visit.     Review of Systems:  GENERAL:  Feels "pretty fine and good".  No fevers or sweats.  Weight up 3 pounds. [name] PERFORMANCE STATUS (ECOG):  0 HEENT:  No visual changes, runny nose, sore throat, mouth sores or tenderness. Lungs: Slight non-productive cough. No shortness of breath.  No hemoptysis. Cardiac:  No chest pain, palpitations, orthopnea, or PND. GI:  No nausea, vomiting, diarrhea, constipation, melena or hematochezia. GU:  No urgency,  frequency, dysuria, or hematuria. Musculoskeletal:  No back pain.  Bilateral knee pain (right > left).  No muscle tenderness. Extremities:  Bad knees.  No swelling. Skin:  No rashes or skin changes. Neuro:  No headache, numbness or weakness, balance or coordination issues. Endocrine:  No diabetes, thyroid issues, hot flashes or night sweats. Psych:  No mood changes, depression or anxiety. Pain:  No focal pain. Review of systems:  All other systems reviewed and found to be negative.   Physical Exam: Blood pressure 138/81, pulse 71, temperature 98.7 F (37.1 C), temperature source Tympanic, resp. rate 20, weight 274 lb 0.5 oz (124.3  kg). GENERAL:  Well developed, well nourished, muscular gentleman sitting comfortably in the exam room in no acute distress. MENTAL STATUS:  Alert and oriented to person, place and time. HEAD:  Pearline Cables hair with short gray beard.  Male pattern baldness.  Normocephalic, atraumatic, face symmetric, no Cushingoid features. EYES:  Hazel eyes.  Pupils equal round and reactive to light and accomodation.  No conjunctivitis or scleral icterus. ENT:  Oropharynx clear without lesion.  Dentures.  Tongue normal. Mucous membranes moist.  RESPIRATORY:  Clear to auscultation without rales, wheezes or rhonchi. CARDIOVASCULAR:  Regular rate and rhythm without murmur, rub or gallop. ABDOMEN:  Soft, non-tender, with active bowel sounds, and no hepatosplenomegaly.  No masses. SKIN:  No rashes, ulcers or lesions. EXTREMITIES: No edema, no skin discoloration or tenderness.  No palpable cords. LYMPH NODES: No palpable cervical, supraclavicular, axillary or inguinal adenopathy  NEUROLOGICAL: Unremarkable. PSYCH:  Appropriate.    Appointment on 04/25/2017  Component Date Value Ref Range Status  . Sodium 04/25/2017 136  135 - 145 mmol/L Final  . Potassium 04/25/2017 4.1  3.5 - 5.1 mmol/L Final  . Chloride 04/25/2017 100* 101 - 111 mmol/L Final  . CO2 04/25/2017 30  22 - 32 mmol/L Final  . Glucose, Bld 04/25/2017 111* 65 - 99 mg/dL Final  . BUN 04/25/2017 13  6 - 20 mg/dL Final  . Creatinine, Ser 04/25/2017 0.78  0.61 - 1.24 mg/dL Final  . Calcium 04/25/2017 9.3  8.9 - 10.3 mg/dL Final  . Total Protein 04/25/2017 7.6  6.5 - 8.1 g/dL Final  . Albumin 04/25/2017 3.8  3.5 - 5.0 g/dL Final  . AST 04/25/2017 31  15 - 41 U/L Final  . ALT 04/25/2017 26  17 - 63 U/L Final  . Alkaline Phosphatase 04/25/2017 70  38 - 126 U/L Final  . Total Bilirubin 04/25/2017 0.7  0.3 - 1.2 mg/dL Final  . GFR calc non Af Amer 04/25/2017 >60  >60 mL/min Final  . GFR calc Af Amer 04/25/2017 >60  >60 mL/min Final   Comment: (NOTE) The eGFR  has been calculated using the CKD EPI equation. This calculation has not been validated in all clinical situations. eGFR's persistently <60 mL/min signify possible Chronic Kidney Disease.   . Anion gap 04/25/2017 6  5 - 15 Final  . WBC 04/25/2017 8.8  3.8 - 10.6 K/uL Final  . RBC 04/25/2017 4.56  4.40 - 5.90 MIL/uL Final  . Hemoglobin 04/25/2017 14.4  13.0 - 18.0 g/dL Final  . HCT 04/25/2017 42.4  40.0 - 52.0 % Final  . MCV 04/25/2017 93.0  80.0 - 100.0 fL Final  . MCH 04/25/2017 31.5  26.0 - 34.0 pg Final  . MCHC 04/25/2017 33.9  32.0 - 36.0 g/dL Final  . RDW 04/25/2017 12.8  11.5 - 14.5 % Final  . Platelets 04/25/2017 219  150 -  440 K/uL Final  . Neutrophils Relative % 04/25/2017 70  % Final  . Neutro Abs 04/25/2017 6.1  1.4 - 6.5 K/uL Final  . Lymphocytes Relative 04/25/2017 20  % Final  . Lymphs Abs 04/25/2017 1.7  1.0 - 3.6 K/uL Final  . Monocytes Relative 04/25/2017 8  % Final  . Monocytes Absolute 04/25/2017 0.7  0.2 - 1.0 K/uL Final  . Eosinophils Relative 04/25/2017 3  % Final  . Eosinophils Absolute 04/25/2017 0.2  0 - 0.7 K/uL Final  . Basophils Relative 04/25/2017 1  % Final  . Basophils Absolute 04/25/2017 0.0  0 - 0.1 K/uL Final    Assessment:  Jared Gutierrez is a 76 y.o. male with stage I colon cancer s/p transverse colectomy on 10/17/2013.  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  Tumor extended into the submucosa. Margins were negative. There was lymphovascular invasion. 14 lymph nodes were negative. Pathologic stage was T1 N0.  Colonoscopy on 10/02/2014 noted several 3 mm polyps and an 8 mm polyp in the cecum and transverse colon.  Pathology revealed tubular adenomas negative for high-grade dysplasia and malignancy.  Colonoscopy on 02/16/2017 revealed 2 diminutive polyps in the descending colon.  Pathology revealed tubular adenomas without dysplasia or  malignancy.  CEA has been followed: 3.1 on 04/27/2014, 2.3 on 11/11/2014, 3.0 on 05/12/2015, 2.7 on 11/10/2015, 3.2 on 05/10/2016, and 2.8 on 11/01/2016.  Symptomatically, patient denies any significant complaint.  He has a slight cough.  Exam is stable.  Labs are unremarkable.  Plan: 1.  Labs today:  CBC with diff, CMP, CEA. 2.  Review interval colonoscopy. Revealed 2 polyps that were categorized as tubular adenomas without dysplasia or malignancy. 3.  RTC in 6 months for MD assess and labs (CBC with diff, CMP, CEA).    Honor Loh, NP  04/25/2017, 9:13 AM   I saw and evaluated the patient, participating in the key portions of the service and reviewing pertinent diagnostic studies and records.  I reviewed the nurse practitioner's note and agree with the findings and the plan.  A few questions were asked by the patient and answered.   Nolon Stalls, MD 04/25/2017,9:13 AM

## 2017-04-26 LAB — CEA: CEA: 3.3 ng/mL (ref 0.0–4.7)

## 2017-05-02 ENCOUNTER — Ambulatory Visit: Payer: Medicare Other | Admitting: Hematology and Oncology

## 2017-05-02 ENCOUNTER — Other Ambulatory Visit: Payer: Medicare Other

## 2017-10-24 ENCOUNTER — Inpatient Hospital Stay: Payer: Medicare Other | Admitting: Hematology and Oncology

## 2017-10-24 ENCOUNTER — Inpatient Hospital Stay: Payer: Medicare Other

## 2017-10-24 NOTE — Progress Notes (Deleted)
[place] [facility] day:  10/24/17  Chief Complaint: Jared Gutierrez is a 77 y.o. male with stage I colon cancer who is seen for 6 month assessment.  HPI: The patient was last seen in the medical oncology clinic by me on 04/25/2017.  At that time, he felt good.  He had a slight cough.  Exam was stable.  Labs were unremarkable.  CBC, CMP, and CEA were normal.  During the interim,    Past Medical History:  Diagnosis Date  . Arthritis   . Cancer ([place])   . Cavitary lesion of lung   . Chicken pox   . Colon cancer ([place])   . Hypertension   . Lipoma of colon   . Nephrolithiasis   . Nephrolithiasis   . Obesity   . Shingles   . Tubular adenoma of colon    multiple fragments    Past Surgical History:  Procedure Laterality Date  . COLONOSCOPY N/A 10/02/2014   Procedure: COLONOSCOPY;  Surgeon: Josefine Class, MD;  Location: [facility] ENDOSCOPY;  Service: Endoscopy;  Laterality: N/A;  . COLONOSCOPY WITH PROPOFOL N/A 02/16/2017   Procedure: COLONOSCOPY WITH PROPOFOL;  Surgeon: Manya Silvas, MD;  Location: [facility] ENDOSCOPY;  Service: Endoscopy;  Laterality: N/A;    Family History  Problem Relation Age of Onset  . Cancer Mother   . Breast cancer Mother   . COPD Father     Social History:  reports that he has never smoked. He has never used smokeless tobacco. He reports that he drinks alcohol. He reports that he does not use drugs.  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  The patient is alone today.  Allergies: No Known Allergies  Current Medications: Current Outpatient Medications  Medication Sig Dispense Refill  . aspirin 81 MG tablet Take 81 mg by mouth daily.    . ferrous sulfate 325 (65 FE) MG EC tablet Take by mouth.    [name] glucosamine-chondroitin 500-400 MG tablet Take 1 tablet by mouth daily.    [name] losartan-hydrochlorothiazide (HYZAAR) 50-12.5 MG per tablet Take 1 tablet by mouth daily.    . meloxicam  (MOBIC) 15 MG tablet Take 15 mg by mouth daily.    [name] oxybutynin (DITROPAN) 5 MG tablet Take 5 mg by mouth 3 (three) times daily.    . Probiotic Product (PROBIOTIC DAILY PO) Take 1 tablet by mouth daily.     No current facility-administered medications for this visit.     Review of Systems:  GENERAL:  Feels "pretty fine and good".  No fevers or sweats.  Weight up 3 pounds. [name] PERFORMANCE STATUS (ECOG):  0 HEENT:  No visual changes, runny nose, sore throat, mouth sores or tenderness. Lungs: Slight non-productive cough. No shortness of breath.  No hemoptysis. Cardiac:  No chest pain, palpitations, orthopnea, or PND. GI:  No nausea, vomiting, diarrhea, constipation, melena or hematochezia. GU:  No urgency, frequency, dysuria, or hematuria. Musculoskeletal:  No back pain.  Bilateral knee pain (right > left).  No muscle tenderness. Extremities:  Bad knees.  No swelling. Skin:  No rashes or skin changes. Neuro:  No headache, numbness or weakness, balance or coordination issues. Endocrine:  No diabetes, thyroid issues, hot flashes or night sweats. Psych:  No mood changes, depression or anxiety. Pain:  No focal pain. Review of systems:  All other systems reviewed and found to be negative.   Physical Exam: There  were no vitals taken for this visit. GENERAL:  Well developed, well nourished, muscular gentleman sitting comfortably in the exam room in no acute distress. MENTAL STATUS:  Alert and oriented to person, place and time. HEAD:  Pearline Cables hair with short gray beard.  Male pattern baldness.  Normocephalic, atraumatic, face symmetric, no Cushingoid features. EYES:  Hazel eyes.  Pupils equal round and reactive to light and accomodation.  No conjunctivitis or scleral icterus. ENT:  Oropharynx clear without lesion.  Dentures.  Tongue normal. Mucous membranes moist.  RESPIRATORY:  Clear to auscultation without rales, wheezes or rhonchi. CARDIOVASCULAR:  Regular rate and rhythm without murmur, rub or  gallop. ABDOMEN:  Soft, non-tender, with active bowel sounds, and no hepatosplenomegaly.  No masses. SKIN:  No rashes, ulcers or lesions. EXTREMITIES: No edema, no skin discoloration or tenderness.  No palpable cords. LYMPH NODES: No palpable cervical, supraclavicular, axillary or inguinal adenopathy  NEUROLOGICAL: Unremarkable. PSYCH:  Appropriate.   GENERAL:  Well developed, well nourished, gentleman sitting comfortably in the exam room in no acute distress. MENTAL STATUS:  Alert and oriented to person, place and time. HEAD:  *** hair.  Normocephalic, atraumatic, face symmetric, no Cushingoid features. EYES:  *** eyes.  Pupils equal round and reactive to light and accomodation.  No conjunctivitis or scleral icterus. ENT:  Oropharynx clear without lesion.  Tongue normal. Mucous membranes moist.  RESPIRATORY:  Clear to auscultation without rales, wheezes or rhonchi. CARDIOVASCULAR:  Regular rate and rhythm without murmur, rub or gallop. ABDOMEN:  Soft, non-tender, with active bowel sounds, and no hepatosplenomegaly.  No masses. SKIN:  No rashes, ulcers or lesions. EXTREMITIES: No edema, no skin discoloration or tenderness.  No palpable cords. LYMPH NODES: No palpable cervical, supraclavicular, axillary or inguinal adenopathy  NEUROLOGICAL: Unremarkable. PSYCH:  Appropriate.    No visits with results within 3 Day(s) from this visit.  Latest known visit with results is:  Appointment on 04/25/2017  Component Date Value Ref Range Status  . Sodium 04/25/2017 136  135 - 145 mmol/L Final  . Potassium 04/25/2017 4.1  3.5 - 5.1 mmol/L Final  . Chloride 04/25/2017 100* 101 - 111 mmol/L Final  . CO2 04/25/2017 30  22 - 32 mmol/L Final  . Glucose, Bld 04/25/2017 111* 65 - 99 mg/dL Final  . BUN 04/25/2017 13  6 - 20 mg/dL Final  . Creatinine, Ser 04/25/2017 0.78  0.61 - 1.24 mg/dL Final  . Calcium 04/25/2017 9.3  8.9 - 10.3 mg/dL Final  . Total Protein 04/25/2017 7.6  6.5 - 8.1 g/dL Final  .  Albumin 04/25/2017 3.8  3.5 - 5.0 g/dL Final  . AST 04/25/2017 31  15 - 41 U/L Final  . ALT 04/25/2017 26  17 - 63 U/L Final  . Alkaline Phosphatase 04/25/2017 70  38 - 126 U/L Final  . Total Bilirubin 04/25/2017 0.7  0.3 - 1.2 mg/dL Final  . GFR calc non Af Amer 04/25/2017 >60  >60 mL/min Final  . GFR calc Af Amer 04/25/2017 >60  >60 mL/min Final   Comment: (NOTE) The eGFR has been calculated using the CKD EPI equation. This calculation has not been validated in all clinical situations. eGFR's persistently <60 mL/min signify possible Chronic Kidney Disease.   . Anion gap 04/25/2017 6  5 - 15 Final  . CEA 04/25/2017 3.3  0.0 - 4.7 ng/mL Final   Comment: (NOTE)       Roche ECLIA methodology       Nonsmokers  <3.9  Smokers     <5.6 Performed At: [facility] [place], [place] [id number] Rush Farmer MD [id number]   . WBC 04/25/2017 8.8  3.8 - 10.6 K/uL Final  . RBC 04/25/2017 4.56  4.40 - 5.90 MIL/uL Final  . Hemoglobin 04/25/2017 14.4  13.0 - 18.0 g/dL Final  . HCT 04/25/2017 42.4  40.0 - 52.0 % Final  . MCV 04/25/2017 93.0  80.0 - 100.0 fL Final  . MCH 04/25/2017 31.5  26.0 - 34.0 pg Final  . MCHC 04/25/2017 33.9  32.0 - 36.0 g/dL Final  . RDW 04/25/2017 12.8  11.5 - 14.5 % Final  . Platelets 04/25/2017 219  150 - 440 K/uL Final  . Neutrophils Relative % 04/25/2017 70  % Final  . Neutro Abs 04/25/2017 6.1  1.4 - 6.5 K/uL Final  . Lymphocytes Relative 04/25/2017 20  % Final  . Lymphs Abs 04/25/2017 1.7  1.0 - 3.6 K/uL Final  . Monocytes Relative 04/25/2017 8  % Final  . Monocytes Absolute 04/25/2017 0.7  0.2 - 1.0 K/uL Final  . Eosinophils Relative 04/25/2017 3  % Final  . Eosinophils Absolute 04/25/2017 0.2  0 - 0.7 K/uL Final  . Basophils Relative 04/25/2017 1  % Final  . Basophils Absolute 04/25/2017 0.0  0 - 0.1 K/uL Final    Assessment:  Jared Gutierrez is a 77 y.o. male with stage I colon cancer s/p  transverse colectomy on 10/17/2013.  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  Tumor extended into the submucosa. Margins were negative. There was lymphovascular invasion. 14 lymph nodes were negative. Pathologic stage was T1 N0.  Colonoscopy on 10/02/2014 noted several 3 mm polyps and an 8 mm polyp in the cecum and transverse colon.  Pathology revealed tubular adenomas negative for high-grade dysplasia and malignancy.  Colonoscopy on 02/16/2017 revealed 2 diminutive polyps in the descending colon.  Pathology revealed tubular adenomas without dysplasia or malignancy.  CEA has been followed: 3.1 on 04/27/2014, 2.3 on 11/11/2014, 3.0 on 05/12/2015, 2.7 on 11/10/2015, 3.2 on 05/10/2016, 2.8 on 11/01/2016, and 3.3 on 04/25/2017.  Symptomatically, patient denies any significant complaint.  He has a slight cough.  Exam is stable.  Labs are unremarkable.  Plan: 1.  Labs today:  CBC with diff, CMP, CEA.   2.  Review interval colonoscopy. Revealed 2 polyps that were categorized as tubular adenomas without dysplasia or malignancy. 3.  RTC in 6 months for MD assess and labs (CBC with diff, CMP, CEA).    Lequita Asal, MD  10/24/2017, 4:46 AM   I saw and evaluated the patient, participating in the key portions of the service and reviewing pertinent diagnostic studies and records.  I reviewed the nurse practitioner's note and agree with the findings and the plan.  A few questions were asked by the patient and answered.   Nolon Stalls, MD 10/24/2017,4:46 AM

## 2017-10-31 ENCOUNTER — Inpatient Hospital Stay (HOSPITAL_BASED_OUTPATIENT_CLINIC_OR_DEPARTMENT_OTHER): Payer: Medicare Other | Admitting: Hematology and Oncology

## 2017-10-31 ENCOUNTER — Inpatient Hospital Stay: Payer: Medicare Other | Attending: Hematology and Oncology

## 2017-10-31 VITALS — BP 165/74 | HR 53 | Temp 97.1°F | Resp 18 | Wt 267.4 lb

## 2017-10-31 DIAGNOSIS — D649 Anemia, unspecified: Secondary | ICD-10-CM | POA: Insufficient documentation

## 2017-10-31 DIAGNOSIS — Z85038 Personal history of other malignant neoplasm of large intestine: Secondary | ICD-10-CM | POA: Diagnosis not present

## 2017-10-31 DIAGNOSIS — C184 Malignant neoplasm of transverse colon: Secondary | ICD-10-CM

## 2017-10-31 DIAGNOSIS — I10 Essential (primary) hypertension: Secondary | ICD-10-CM | POA: Diagnosis not present

## 2017-10-31 LAB — COMPREHENSIVE METABOLIC PANEL
ALT: 30 U/L (ref 17–63)
AST: 41 U/L (ref 15–41)
Albumin: 3.9 g/dL (ref 3.5–5.0)
Alkaline Phosphatase: 62 U/L (ref 38–126)
Anion gap: 12 (ref 5–15)
BUN: 13 mg/dL (ref 6–20)
CO2: 26 mmol/L (ref 22–32)
Calcium: 9.1 mg/dL (ref 8.9–10.3)
Chloride: 104 mmol/L (ref 101–111)
Creatinine, Ser: 0.75 mg/dL (ref 0.61–1.24)
GFR calc Af Amer: >60 mL/min (ref >60)
GFR calc non Af Amer: >60 mL/min (ref >60)
Glucose, Bld: 91 mg/dL (ref 65–99)
Potassium: 3.8 mmol/L (ref 3.5–5.1)
Sodium: 142 mmol/L (ref 135–145)
Total Bilirubin: 0.8 mg/dL (ref 0.3–1.2)
Total Protein: 7.2 g/dL (ref 6.5–8.1)

## 2017-10-31 LAB — CBC WITH DIFFERENTIAL/PLATELET
Basophils Absolute: 0.1 10*3/uL (ref 0–0.1)
Basophils Relative: 1 %
Eosinophils Absolute: 0.2 10*3/uL (ref 0–0.7)
Eosinophils Relative: 3 %
HCT: 39.3 % — ABNORMAL LOW (ref 40.0–52.0)
Hemoglobin: 13.2 g/dL (ref 13.0–18.0)
Lymphocytes Relative: 29 %
Lymphs Abs: 2.1 10*3/uL (ref 1.0–3.6)
MCH: 31.6 pg (ref 26.0–34.0)
MCHC: 33.7 g/dL (ref 32.0–36.0)
MCV: 93.7 fL (ref 80.0–100.0)
Monocytes Absolute: 0.6 10*3/uL (ref 0.2–1.0)
Monocytes Relative: 8 %
Neutro Abs: 4.4 10*3/uL (ref 1.4–6.5)
Neutrophils Relative %: 59 %
Platelets: 249 10*3/uL (ref 150–440)
RBC: 4.19 MIL/uL — ABNORMAL LOW (ref 4.40–5.90)
RDW: 12.5 % (ref 11.5–14.5)
WBC: 7.2 10*3/uL (ref 3.8–10.6)

## 2017-10-31 LAB — FERRITIN: Ferritin: 82 ng/mL (ref 24–336)

## 2017-10-31 NOTE — Progress Notes (Signed)
Patient offers no complaints today. 

## 2017-10-31 NOTE — Progress Notes (Signed)
[facility] day:  10/31/17  Chief Complaint: Jared Gutierrez is a 77 y.o. male with stage I colon cancer who is seen for 6 month assessment.  HPI: The patient was last seen in the medical oncology clinic by me on 04/25/2017.  At that time, he felt good.  He had a slight cough.  Exam was stable.  Labs were unremarkable.  CBC, CMP, and CEA were normal.  During the interim, patient has been doing "pretty fair". Patient complains of pain in his knees (chronic). He denies nausea, vomiting, and pain. Patient denies bleeding; no hematochezia, melena, or gross hematuria.   Patient denies that he has experienced any B symptoms. He denies any interval infections. Patient maintains an adequate appetite, and notes that he is eating well. Weight, compared to his last visit to the clinic, has decreased by 7 pounds.   Patient denies pain in the clinic today.   Past Medical History:  Diagnosis Date  . Arthritis   . Cancer ([place])   . Cavitary lesion of lung   . Chicken pox   . Colon cancer ([place])   . Hypertension   . Lipoma of colon   . Nephrolithiasis   . Nephrolithiasis   . Obesity   . Shingles   . Tubular adenoma of colon    multiple fragments    Past Surgical History:  Procedure Laterality Date  . COLONOSCOPY N/A 10/02/2014   Procedure: COLONOSCOPY;  Surgeon: Josefine Class, MD;  Location: [facility] ENDOSCOPY;  Service: Endoscopy;  Laterality: N/A;  . COLONOSCOPY WITH PROPOFOL N/A 02/16/2017   Procedure: COLONOSCOPY WITH PROPOFOL;  Surgeon: Manya Silvas, MD;  Location: [facility] ENDOSCOPY;  Service: Endoscopy;  Laterality: N/A;    Family History  Problem Relation Age of Onset  . Cancer Mother   . Breast cancer Mother   . COPD Father     Social History:  reports that he has never smoked. He has never used smokeless tobacco. He reports that he drinks alcohol. He reports that he does not use drugs.  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  The patient is alone today.  Allergies: No Known Allergies  Current Medications: Current Outpatient Medications  Medication Sig Dispense Refill  . aspirin 81 MG tablet Take 81 mg by mouth daily.    . ferrous sulfate 325 (65 FE) MG EC tablet Take by mouth.    [name] glucosamine-chondroitin 500-400 MG tablet Take 1 tablet by mouth daily.    . meloxicam (MOBIC) 15 MG tablet Take 15 mg by mouth daily.    [name] olmesartan (BENICAR) 20 MG tablet Take 20 mg by mouth daily.    [name] oxybutynin (DITROPAN) 5 MG tablet Take 5 mg by mouth 3 (three) times daily.    . Probiotic Product (PROBIOTIC DAILY PO) Take 1 tablet by mouth daily.     No current facility-administered medications for this visit.     Review of Systems  Constitutional: Positive for weight loss (down 7 pounds). Negative for diaphoresis, fever and malaise/fatigue.       "I'm doing pretty fair".  HENT: Negative.  Negative for congestion, ear pain, nosebleeds and sore throat.   Eyes: Negative.  Negative for blurred vision, double vision, photophobia and redness.  Respiratory: Negative for cough, hemoptysis, sputum production and shortness of breath.   Cardiovascular: Negative for chest pain, palpitations, orthopnea, leg swelling and PND.  Gastrointestinal: Negative  for abdominal pain, blood in stool, constipation, diarrhea, melena, nausea and vomiting.  Genitourinary: Negative for dysuria, frequency, hematuria and urgency.  Musculoskeletal: Positive for joint pain (chronic bilateral knee pain). Negative for back pain, falls and myalgias.  Skin: Negative for itching and rash.  Neurological: Negative for dizziness, tremors, weakness and headaches.  Endo/Heme/Allergies: Does not bruise/bleed easily.  Psychiatric/Behavioral: Negative for depression, memory loss and suicidal ideas. The patient is not nervous/anxious and does not have insomnia.   All other systems reviewed and are negative.  Performance status (ECOG): 0 -  Asymptomatic   Physical Exam: Blood pressure (!) 165/74, pulse (!) 53, temperature (!) 97.1 F (36.2 C), temperature source Tympanic, resp. rate 18, weight 267 lb 6.7 oz (121.3 kg). GENERAL:  Well developed, well nourished, muscular gentleman sitting comfortably in the exam room in no acute distress. MENTAL STATUS:  Alert and oriented to person, place and time. HEAD:  Pearline Cables hair.  Normocephalic, atraumatic, face symmetric, no Cushingoid features. EYES:  Hazel eyes.  Pupils equal round and reactive to light and accomodation.  No conjunctivitis or scleral icterus. ENT:  Oropharynx clear without lesion.  Tongue normal.  Dentures.  Mucous membranes moist.  RESPIRATORY:  Clear to auscultation without rales, wheezes or rhonchi. CARDIOVASCULAR:  Regular rate and rhythm without murmur, rub or gallop. ABDOMEN:  Soft, non-tender, with active bowel sounds, and no hepatosplenomegaly.  No masses. SKIN:  No rashes, ulcers or lesions. EXTREMITIES: No edema, no skin discoloration or tenderness.  No palpable cords. LYMPH NODES: No palpable cervical, supraclavicular, axillary or inguinal adenopathy  NEUROLOGICAL: Unremarkable. PSYCH:  Appropriate.   Appointment on 10/31/2017  Component Date Value Ref Range Status  . Sodium 10/31/2017 142  135 - 145 mmol/L Final  . Potassium 10/31/2017 3.8  3.5 - 5.1 mmol/L Final  . Chloride 10/31/2017 104  101 - 111 mmol/L Final  . CO2 10/31/2017 26  22 - 32 mmol/L Final  . Glucose, Bld 10/31/2017 91  65 - 99 mg/dL Final  . BUN 10/31/2017 13  6 - 20 mg/dL Final  . Creatinine, Ser 10/31/2017 0.75  0.61 - 1.24 mg/dL Final  . Calcium 10/31/2017 9.1  8.9 - 10.3 mg/dL Final  . Total Protein 10/31/2017 7.2  6.5 - 8.1 g/dL Final  . Albumin 10/31/2017 3.9  3.5 - 5.0 g/dL Final  . AST 10/31/2017 41  15 - 41 U/L Final  . ALT 10/31/2017 30  17 - 63 U/L Final  . Alkaline Phosphatase 10/31/2017 62  38 - 126 U/L Final  . Total Bilirubin 10/31/2017 0.8  0.3 - 1.2 mg/dL Final  .  GFR calc non Af Amer 10/31/2017 >60  >60 mL/min Final  . GFR calc Af Amer 10/31/2017 >60  >60 mL/min Final   Comment: (NOTE) The eGFR has been calculated using the CKD EPI equation. This calculation has not been validated in all clinical situations. eGFR's persistently <60 mL/min signify possible Chronic Kidney Disease.   Georgiann Hahn gap 10/31/2017 12  5 - 15 Final   Performed at [facility], [address], [place], [place] [address]  . WBC 10/31/2017 7.2  3.8 - 10.6 K/uL Final  . RBC 10/31/2017 4.19* 4.40 - 5.90 MIL/uL Final  . Hemoglobin 10/31/2017 13.2  13.0 - 18.0 g/dL Final  . HCT 10/31/2017 39.3* 40.0 - 52.0 % Final  . MCV 10/31/2017 93.7  80.0 - 100.0 fL Final  . MCH 10/31/2017 31.6  26.0 - 34.0 pg Final  . MCHC 10/31/2017 33.7  32.0 - 36.0 g/dL Final  . RDW 10/31/2017 12.5  11.5 - 14.5 % Final  . Platelets 10/31/2017 249  150 - 440 K/uL Final  . Neutrophils Relative % 10/31/2017 59  % Final  . Neutro Abs 10/31/2017 4.4  1.4 - 6.5 K/uL Final  . Lymphocytes Relative 10/31/2017 29  % Final  . Lymphs Abs 10/31/2017 2.1  1.0 - 3.6 K/uL Final  . Monocytes Relative 10/31/2017 8  % Final  . Monocytes Absolute 10/31/2017 0.6  0.2 - 1.0 K/uL Final  . Eosinophils Relative 10/31/2017 3  % Final  . Eosinophils Absolute 10/31/2017 0.2  0 - 0.7 K/uL Final  . Basophils Relative 10/31/2017 1  % Final  . Basophils Absolute 10/31/2017 0.1  0 - 0.1 K/uL Final   Performed at [facility], [address], [place],  [address]    Assessment:  BALIN VANDEGRIFT is a 77 y.o. male with stage I colon cancer s/p transverse colectomy on 10/17/2013.  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  Tumor extended into the submucosa. Margins were negative. There was lymphovascular invasion. 14 lymph nodes were negative. Pathologic stage was T1 N0.  Colonoscopy on 10/02/2014 noted  several 3 mm polyps and an 8 mm polyp in the cecum and transverse colon.  Pathology revealed tubular adenomas negative for high-grade dysplasia and malignancy.  Colonoscopy on 02/16/2017 revealed 2 diminutive polyps in the descending colon.  Pathology revealed tubular adenomas without dysplasia or malignancy.  CEA has been followed: 3.1 on 04/27/2014, 2.3 on 11/11/2014, 3.0 on 05/12/2015, 2.7 on 11/10/2015, 3.2 on 05/10/2016, 2.8 on 11/01/2016, and 3.3 on 04/25/2017.  Symptomatically, patient is doing "pretty fair". He denies acute complaints today. Continues to have pain in his knees. Exam is stable.  Labs are unremarkable.  Ferritin is 82.  Plan: 1. Labs today:  CBC with diff, CMP, CEA, ferritin 2. Discuss mild normocytic anemia. Will check ferritin to further assess.  3. RTC in 6 months for MD assess and labs (CBC with diff, CMP, CEA).    Honor Loh, NP  10/31/2017, 9:37 AM   I saw and evaluated the patient, participating in the key portions of the service and reviewing pertinent diagnostic studies and records.  I reviewed the nurse practitioner's note and agree with the findings and the plan.  A few questions were asked by the patient and answered.   Nolon Stalls, MD 10/31/2017,9:37 AM

## 2017-11-01 LAB — CEA: CEA: 3.1 ng/mL (ref 0.0–4.7)

## 2017-11-03 ENCOUNTER — Encounter: Payer: Self-pay | Admitting: Hematology and Oncology

## 2018-05-01 ENCOUNTER — Inpatient Hospital Stay: Payer: Medicare Other

## 2018-05-01 ENCOUNTER — Encounter: Payer: Self-pay | Admitting: Hematology and Oncology

## 2018-05-01 ENCOUNTER — Inpatient Hospital Stay: Payer: Medicare Other | Attending: Hematology and Oncology | Admitting: Hematology and Oncology

## 2018-05-01 VITALS — BP 157/89 | HR 70 | Temp 95.9°F | Resp 18 | Wt 277.8 lb

## 2018-05-01 DIAGNOSIS — Z85038 Personal history of other malignant neoplasm of large intestine: Secondary | ICD-10-CM

## 2018-05-01 DIAGNOSIS — E669 Obesity, unspecified: Secondary | ICD-10-CM | POA: Diagnosis not present

## 2018-05-01 DIAGNOSIS — I10 Essential (primary) hypertension: Secondary | ICD-10-CM

## 2018-05-01 DIAGNOSIS — Z803 Family history of malignant neoplasm of breast: Secondary | ICD-10-CM

## 2018-05-01 DIAGNOSIS — D649 Anemia, unspecified: Secondary | ICD-10-CM | POA: Insufficient documentation

## 2018-05-01 LAB — COMPREHENSIVE METABOLIC PANEL
ALT: 36 U/L (ref 0–44)
AST: 35 U/L (ref 15–41)
Albumin: 3.9 g/dL (ref 3.5–5.0)
Alkaline Phosphatase: 68 U/L (ref 38–126)
Anion gap: 7 (ref 5–15)
BUN: 9 mg/dL (ref 8–23)
CO2: 29 mmol/L (ref 22–32)
Calcium: 9.2 mg/dL (ref 8.9–10.3)
Chloride: 104 mmol/L (ref 98–111)
Creatinine, Ser: 0.8 mg/dL (ref 0.61–1.24)
GFR calc Af Amer: >60 mL/min (ref >60)
GFR calc non Af Amer: >60 mL/min (ref >60)
Glucose, Bld: 90 mg/dL (ref 70–99)
Potassium: 4.1 mmol/L (ref 3.5–5.1)
Sodium: 140 mmol/L (ref 135–145)
Total Bilirubin: 1 mg/dL (ref 0.3–1.2)
Total Protein: 7.7 g/dL (ref 6.5–8.1)

## 2018-05-01 LAB — CBC WITH DIFFERENTIAL/PLATELET
Abs Immature Granulocytes: 0.03 10*3/uL (ref 0.00–0.07)
Basophils Absolute: 0 10*3/uL (ref 0.0–0.1)
Basophils Relative: 0 %
Eosinophils Absolute: 0.1 10*3/uL (ref 0.0–0.5)
Eosinophils Relative: 2 %
HCT: 41.7 % (ref 39.0–52.0)
Hemoglobin: 13.7 g/dL (ref 13.0–17.0)
Immature Granulocytes: 0 %
Lymphocytes Relative: 32 %
Lymphs Abs: 2.5 10*3/uL (ref 0.7–4.0)
MCH: 30.9 pg (ref 26.0–34.0)
MCHC: 32.9 g/dL (ref 30.0–36.0)
MCV: 94.1 fL (ref 80.0–100.0)
Monocytes Absolute: 0.6 10*3/uL (ref 0.1–1.0)
Monocytes Relative: 8 %
Neutro Abs: 4.3 10*3/uL (ref 1.7–7.7)
Neutrophils Relative %: 58 %
Platelets: 221 10*3/uL (ref 150–400)
RBC: 4.43 MIL/uL (ref 4.22–5.81)
RDW: 12.1 % (ref 11.5–15.5)
WBC: 7.6 10*3/uL (ref 4.0–10.5)
nRBC: 0 % (ref 0.0–0.2)

## 2018-05-01 NOTE — Progress Notes (Signed)
[facility] day:  05/01/18  Chief Complaint: Jared Gutierrez is a 77 y.o. male with stage I colon cancer who is seen for 6 month assessment.  HPI: The patient was last seen in the medical oncology on 10/31/2017.  At that time, he was doing "pretty fair". He denied acute complaints.  He continued to have pain in his knees. Exam was stable.  Labs were unremarkable.  Ferritin was 82.   CEA was 3.1.  In the interim, patient has been doing "pretty good". He notes daily pain in his knees. Patient denies nausea, vomiting, and changes to his bowel habits. He denies any abdominal pain. Patient denies bleeding; no hematochezia, melena, or gross hematuria. Patient denies that he has experienced any B symptoms. He denies any interval infections.   Patient advises that he maintains an adequate appetite. He is eating well. Weight today is 277 lb 12.5 oz (126 kg), which compared to his last visit to the clinic, represents a 10 pound increase. Patient remains on oral iron once daily.    Patient denies pain in the clinic today.   Past Medical History:  Diagnosis Date  . Arthritis   . Cancer ([place])   . Cavitary lesion of lung   . Chicken pox   . Colon cancer ([place])   . Hypertension   . Lipoma of colon   . Nephrolithiasis   . Nephrolithiasis   . Obesity   . Shingles   . Tubular adenoma of colon    multiple fragments    Past Surgical History:  Procedure Laterality Date  . COLONOSCOPY N/A 10/02/2014   Procedure: COLONOSCOPY;  Surgeon: Josefine Class, MD;  Location: [facility] ENDOSCOPY;  Service: Endoscopy;  Laterality: N/A;  . COLONOSCOPY WITH PROPOFOL N/A 02/16/2017   Procedure: COLONOSCOPY WITH PROPOFOL;  Surgeon: Manya Silvas, MD;  Location[facility] ENDOSCOPY;  Service: Endoscopy;  Laterality: N/A;    Family History  Problem Relation Age of Onset  . Cancer Mother   . Breast cancer Mother   . COPD Father     Social History:  reports that he has  never smoked. He has never used smokeless tobacco. He reports current alcohol use. He reports that he does not use drugs.  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  The patient is alone today.  Allergies: No Known Allergies  Current Medications: Current Outpatient Medications  Medication Sig Dispense Refill  . aspirin 81 MG tablet Take 81 mg by mouth daily.    . ferrous sulfate 325 (65 FE) MG EC tablet Take by mouth.    [name] glucosamine-chondroitin 500-400 MG tablet Take 1 tablet by mouth daily.    . meloxicam (MOBIC) 15 MG tablet Take 15 mg by mouth daily.    [name] olmesartan (BENICAR) 20 MG tablet Take 20 mg by mouth daily.    [name] oxybutynin (DITROPAN) 5 MG tablet Take 5 mg by mouth 3 (three) times daily.    . Probiotic Product (PROBIOTIC DAILY PO) Take 1 tablet by mouth daily.     No current facility-administered medications for this visit.     Review of Systems  Constitutional: Negative.  Negative for chills, diaphoresis, fever, malaise/fatigue and weight loss (up 10 pounds).       Feels "pretty good".  HENT: Negative.  Negative for congestion, ear pain, nosebleeds, sinus pain and sore throat.   Eyes: Negative.  Negative for blurred vision,  double vision, photophobia and pain.  Respiratory: Negative.  Negative for cough, hemoptysis, sputum production and shortness of breath.   Cardiovascular: Negative.  Negative for chest pain, palpitations, orthopnea, leg swelling and PND.  Gastrointestinal: Negative.  Negative for abdominal pain, blood in stool, constipation, diarrhea, melena, nausea and vomiting.  Genitourinary: Negative.  Negative for dysuria, frequency, hematuria and urgency.  Musculoskeletal: Positive for joint pain (chronic knee pain). Negative for back pain, falls, myalgias and neck pain.  Skin: Negative.  Negative for itching and rash.  Neurological: Negative.  Negative for dizziness, tremors, sensory change, speech change, focal weakness, weakness and  headaches.  Endo/Heme/Allergies: Negative.  Does not bruise/bleed easily.  Psychiatric/Behavioral: Negative.  Negative for depression and memory loss. The patient is not nervous/anxious and does not have insomnia.   All other systems reviewed and are negative.  Performance status (ECOG): 0   Physical Exam: Blood pressure (!) 157/89, pulse 70, temperature (!) 95.9 F (35.5 C), temperature source Tympanic, resp. rate 18, weight 277 lb 12.5 oz (126 kg). GENERAL:  Well developed, well nourished, gentleman sitting comfortably in the exam room in no acute distress. MENTAL STATUS:  Alert and oriented to person, place and time. HEAD:  Wearing a cap.  Short gray hair and breard.  Normocephalic, atraumatic, face symmetric, no Cushingoid features. EYES:  Hazel eyes.  Pupils equal round and reactive to light and accomodation.  No conjunctivitis or scleral icterus. ENT:  Oropharynx clear without lesion.  Tongue normal.  Dentures.  Mucous membranes moist.  RESPIRATORY:  Clear to auscultation without rales, wheezes or rhonchi. CARDIOVASCULAR:  Regular rate and rhythm without murmur, rub or gallop. ABDOMEN:  Soft, non-tender, with active bowel sounds, and no hepatosplenomegaly.  No masses. SKIN:  No rashes, ulcers or lesions. EXTREMITIES: No edema, no skin discoloration or tenderness.  No palpable cords. LYMPH NODES: No palpable cervical, supraclavicular, axillary or inguinal adenopathy  NEUROLOGICAL: Unremarkable. PSYCH:  Appropriate.    Appointment on 05/01/2018  Component Date Value Ref Range Status  . Sodium 05/01/2018 140  135 - 145 mmol/L Final  . Potassium 05/01/2018 4.1  3.5 - 5.1 mmol/L Final  . Chloride 05/01/2018 104  98 - 111 mmol/L Final  . CO2 05/01/2018 29  22 - 32 mmol/L Final  . Glucose, Bld 05/01/2018 90  70 - 99 mg/dL Final  . BUN 05/01/2018 9  8 - 23 mg/dL Final  . Creatinine, Ser 05/01/2018 0.80  0.61 - 1.24 mg/dL Final  . Calcium 05/01/2018 9.2  8.9 - 10.3 mg/dL Final  .  Total Protein 05/01/2018 7.7  6.5 - 8.1 g/dL Final  . Albumin 05/01/2018 3.9  3.5 - 5.0 g/dL Final  . AST 05/01/2018 35  15 - 41 U/L Final  . ALT 05/01/2018 36  0 - 44 U/L Final  . Alkaline Phosphatase 05/01/2018 68  38 - 126 U/L Final  . Total Bilirubin 05/01/2018 1.0  0.3 - 1.2 mg/dL Final  . GFR calc non Af Amer 05/01/2018 >60  >60 mL/min Final  . GFR calc Af Amer 05/01/2018 >60  >60 mL/min Final  . Anion gap 05/01/2018 7  5 - 15 Final   Performed at [facility], [address] [place], [place] [address]  . WBC 05/01/2018 7.6  4.0 - 10.5 K/uL Final  . RBC 05/01/2018 4.43  4.22 - 5.81 MIL/uL Final  . Hemoglobin 05/01/2018 13.7  13.0 - 17.0 g/dL Final  . HCT 05/01/2018 41.7  39.0 - 52.0 % Final  .  MCV 05/01/2018 94.1  80.0 - 100.0 fL Final  . MCH 05/01/2018 30.9  26.0 - 34.0 pg Final  . MCHC 05/01/2018 32.9  30.0 - 36.0 g/dL Final  . RDW 05/01/2018 12.1  11.5 - 15.5 % Final  . Platelets 05/01/2018 221  150 - 400 K/uL Final  . nRBC 05/01/2018 0.0  0.0 - 0.2 % Final  . Neutrophils Relative % 05/01/2018 58  % Final  . Neutro Abs 05/01/2018 4.3  1.7 - 7.7 K/uL Final  . Lymphocytes Relative 05/01/2018 32  % Final  . Lymphs Abs 05/01/2018 2.5  0.7 - 4.0 K/uL Final  . Monocytes Relative 05/01/2018 8  % Final  . Monocytes Absolute 05/01/2018 0.6  0.1 - 1.0 K/uL Final  . Eosinophils Relative 05/01/2018 2  % Final  . Eosinophils Absolute 05/01/2018 0.1  0.0 - 0.5 K/uL Final  . Basophils Relative 05/01/2018 0  % Final  . Basophils Absolute 05/01/2018 0.0  0.0 - 0.1 K/uL Final  . Immature Granulocytes 05/01/2018 0  % Final  . Abs Immature Granulocytes 05/01/2018 0.03  0.00 - 0.07 K/uL Final   Performed at [facility], [address] [place], [place] [address]    Assessment:  RHET RORKE is a 77 y.o. male with stage I colon cancer s/p transverse colectomy on 10/17/2013.  Pathology revealed a 1 cm moderately differentiated invasive adenocarcinoma arising in a  4.6 cm tubulovillous adenoma with high-grade dysplasia.  Tumor extended into the submucosa. Margins were negative. There was lymphovascular invasion. 14 lymph nodes were negative. Pathologic stage was T1 N0.  Colonoscopy on 10/02/2014 noted several 3 mm polyps and an 8 mm polyp in the cecum and transverse colon.  Pathology revealed tubular adenomas negative for high-grade dysplasia and malignancy.  Colonoscopy on 02/16/2017 revealed 2 diminutive polyps in the descending colon.  Pathology revealed tubular adenomas without dysplasia or malignancy.  CEA has been followed: 3.1 on 04/27/2014, 2.3 on 11/11/2014, 3.0 on 05/12/2015, 2.7 on 11/10/2015, 3.2 on 05/10/2016, 2.8 on 11/01/2016, 3.3 on 04/25/2017, 3.1 on 10/31/2017, and 4.0 on 05/01/2018.  Symptomatically, he feels "pretty good".  He only notes knee pain.  Exam is unremarkable.  Hemoglobin is 13.7.  Plan: 1.   Labs today:  CBC with diff, CMP. 2.   Stage I colon cancer  Clinically doing well.  CEA is 4.0.  Continue surveillance. 3.   Normocytic anemia  Hematocrit 41.7, hemoglobin 13.7, MCV 94.1.  Patient eating well.  Stop oral iron.  4.   RTC in 6 months for MD assessment and labs (CBC with diff, CMP, CEA).    Honor Loh, NP  05/01/2018, 10:34 AM   I saw and evaluated the patient, participating in the key portions of the service and reviewing pertinent diagnostic studies and records.  I reviewed the nurse practitioner's note and agree with the findings and the plan.  A few questions were asked by the patient and answered.   Nolon Stalls, MD 05/01/2018,10:34 AM

## 2018-05-01 NOTE — Progress Notes (Signed)
Patient offers no complaints today. 

## 2018-05-02 LAB — CEA: CEA: 4 ng/mL (ref 0.0–4.7)

## 2018-10-31 ENCOUNTER — Other Ambulatory Visit: Payer: Medicare Other

## 2018-10-31 ENCOUNTER — Ambulatory Visit: Payer: Medicare Other | Admitting: Hematology and Oncology

## 2018-11-26 NOTE — Progress Notes (Signed)
[facility]  [address] [place], [place] [address] Phone: [phone]  Fax: [phone]   Clinic Day:  11/28/2018  Referring physician: Tracie Harrier, MD  Chief Complaint: Jared Gutierrez is a 78 y.o. male with stage I colon cancer who is seen for 7 month assessment.   HPI: The patient was last seen in the medical oncology clinic on 05/02/2019. At that time, he felt "pretty good". He only noted knee pain. Exam was unremarkable.  Hemoglobin was 13.7.  CEA was 4.0.  He stopped oral iron.   During the interim, the patient is doing "alright". Patient denies any new symptoms. His weight is up 3 lbs. He reports a good diet that consist of iron rich foods. He is interested in being seen once a year.    Past Medical History:  Diagnosis Date  . Arthritis   . Cancer ([place])   . Cavitary lesion of lung   . Chicken pox   . Colon cancer ([place])   . Hypertension   . Lipoma of colon   . Nephrolithiasis   . Nephrolithiasis   . Obesity   . Shingles   . Tubular adenoma of colon    multiple fragments    Past Surgical History:  Procedure Laterality Date  . COLONOSCOPY N/A 10/02/2014   Procedure: COLONOSCOPY;  Surgeon: Josefine Class, MD;  Location: [facility] ENDOSCOPY;  Service: Endoscopy;  Laterality: N/A;  . COLONOSCOPY WITH PROPOFOL N/A 02/16/2017   Procedure: COLONOSCOPY WITH PROPOFOL;  Surgeon: Manya Silvas, MD;  Location: [facility] ENDOSCOPY;  Service: Endoscopy;  Laterality: N/A;    Family History  Problem Relation Age of Onset  . Cancer Mother   . Breast cancer Mother   . COPD Father     Social History:  reports that he has never smoked. He has never used smokeless tobacco. He reports current alcohol use. He reports that he does not use drugs. 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 The patient is alone today.  Allergies: No Known Allergies  Current Medications: Current Outpatient Medications  Medication  Sig Dispense Refill  . aspirin 81 MG tablet Take 81 mg by mouth daily.    . ferrous sulfate 325 (65 FE) MG EC tablet Take by mouth.    [name] glucosamine-chondroitin 500-400 MG tablet Take 1 tablet by mouth daily.    . meloxicam (MOBIC) 15 MG tablet Take 15 mg by mouth daily.    [name] olmesartan (BENICAR) 20 MG tablet Take 20 mg by mouth daily.    [name] oxybutynin (DITROPAN) 5 MG tablet Take 5 mg by mouth 3 (three) times daily.    . Probiotic Product (PROBIOTIC DAILY PO) Take 1 tablet by mouth daily.     No current facility-administered medications for this visit.     Review of Systems  Constitutional: Negative.  Negative for chills, diaphoresis, fever, malaise/fatigue and weight loss (up 3 pounds).       Doing "alright".  HENT: Negative.  Negative for congestion, ear pain, nosebleeds, sinus pain and sore throat.   Eyes: Negative.  Negative for blurred vision, double vision, photophobia and pain.  Respiratory: Negative.  Negative for cough, hemoptysis, sputum production and shortness of breath.   Cardiovascular: Negative.  Negative for chest pain, palpitations, orthopnea, leg swelling and PND.  Gastrointestinal: Negative.  Negative for abdominal pain, blood in stool, constipation, diarrhea, melena, nausea and vomiting.  Genitourinary: Negative.  Negative for dysuria, frequency,  hematuria and urgency.  Musculoskeletal: Positive for joint pain (chronic knee pain). Negative for back pain, falls, myalgias and neck pain.  Skin: Negative.  Negative for itching and rash.  Neurological: Negative.  Negative for dizziness, tremors, sensory change, speech change, focal weakness, weakness and headaches.  Endo/Heme/Allergies: Negative.  Does not bruise/bleed easily.  Psychiatric/Behavioral: Negative.  Negative for depression and memory loss. The patient is not nervous/anxious and does not have insomnia.   All other systems reviewed and are negative.  Performance status (ECOG): 0  Vitals Blood pressure (!)  162/67, pulse 65, temperature 98.1 F (36.7 C), temperature source Tympanic, resp. rate 18, height 5\' 9"  (1.753 m), weight 280 lb 3.3 oz (127.1 kg), SpO2 100 %.  Physical Exam  Constitutional: He is oriented to person, place, and time. He appears well-developed and well-nourished. No distress.  HENT:  Head: Normocephalic and atraumatic.  Mouth/Throat: Oropharynx is clear and moist. No oropharyngeal exudate.  Wearing cap. Short gray hair and beard.  Eyes: Pupils are equal, round, and reactive to light. Conjunctivae and EOM are normal. No scleral icterus.  Hazel eyes.  Neck: Normal range of motion. Neck supple.  Cardiovascular: Normal rate, regular rhythm and normal heart sounds.  No murmur heard. Pulmonary/Chest: Effort normal. No respiratory distress.  Abdominal: Soft. Bowel sounds are normal. There is no abdominal tenderness.  Musculoskeletal: Normal range of motion.        General: No tenderness or edema.  Lymphadenopathy:    He has no cervical adenopathy.    He has no axillary adenopathy.       Right: No supraclavicular adenopathy present.       Left: No supraclavicular adenopathy present.  Neurological: He is alert and oriented to person, place, and time. He has normal reflexes.  Skin: Skin is warm and dry. He is not diaphoretic.  Psychiatric: He has a normal mood and affect. His behavior is normal. Judgment and thought content normal.  Nursing note and vitals reviewed.   Appointment on 11/28/2018  Component Date Value Ref Range Status  . WBC 11/28/2018 6.5  4.0 - 10.5 K/uL Final  . RBC 11/28/2018 4.26  4.22 - 5.81 MIL/uL Final  . Hemoglobin 11/28/2018 13.8  13.0 - 17.0 g/dL Final  . HCT 11/28/2018 40.7  39.0 - 52.0 % Final  . MCV 11/28/2018 95.5  80.0 - 100.0 fL Final  . MCH 11/28/2018 32.4  26.0 - 34.0 pg Final  . MCHC 11/28/2018 33.9  30.0 - 36.0 g/dL Final  . RDW 11/28/2018 12.4  11.5 - 15.5 % Final  . Platelets 11/28/2018 205  150 - 400 K/uL Final  . nRBC 11/28/2018  0.0  0.0 - 0.2 % Final  . Neutrophils Relative % 11/28/2018 63  % Final  . Neutro Abs 11/28/2018 4.1  1.7 - 7.7 K/uL Final  . Lymphocytes Relative 11/28/2018 28  % Final  . Lymphs Abs 11/28/2018 1.8  0.7 - 4.0 K/uL Final  . Monocytes Relative 11/28/2018 7  % Final  . Monocytes Absolute 11/28/2018 0.5  0.1 - 1.0 K/uL Final  . Eosinophils Relative 11/28/2018 1  % Final  . Eosinophils Absolute 11/28/2018 0.1  0.0 - 0.5 K/uL Final  . Basophils Relative 11/28/2018 1  % Final  . Basophils Absolute 11/28/2018 0.0  0.0 - 0.1 K/uL Final  . Immature Granulocytes 11/28/2018 0  % Final  . Abs Immature Granulocytes 11/28/2018 0.01  0.00 - 0.07 K/uL Final   Performed at [facility], [phone]  [address] [place], [place] [address]    Assessment:  GERAMY LAMORTE is a 78 y.o. male with stage I colon cancer s/p transverse colectomy on 10/17/2013.  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  Tumor extended into the submucosa. Margins were negative. There was lymphovascular invasion. 14 lymph nodes were negative. Pathologic stage was T1 N0.  Colonoscopy on 10/02/2014 noted several 3 mm polyps and an 8 mm polyp in the cecum and transverse colon.  Pathology revealed tubular adenomas negative for high-grade dysplasia and malignancy.  Colonoscopy on 02/16/2017 revealed 2 diminutive polyps in the descending colon.  Pathology revealed tubular adenomas without dysplasia or malignancy.  CEA has been followed: 3.1 on 04/27/2014, 2.3 on 11/11/2014, 3.0 on 05/12/2015, 2.7 on 11/10/2015, 3.2 on 05/10/2016, 2.8 on 11/01/2016, 3.3 on 04/25/2017, 3.1 on 10/31/2017, 4.0 on 05/01/2018, and 7.0 on 0716/2020.  Symptomatically, he is doing well.  He voices no concerns.  Exam is stable.  Plan: 1.   Labs today:  CBC with diff, CMP, CEA  2.   Stage I colon cancer             Clinically he is doing well.  Exam is  unremarkable.  CEA is pending.   Continue surveillance. 3.   Normocytic anemia, resolved             Hematocrit 40.7, hemoglobin 13.8, MCV 95.5.             He is eating well.             Continue to monitor. 4.     RTC in 1 year for MD assessment and labs (CBC with diff, CMP, CEA).  Addendum:  CEA was unexpectantly 7.   Patient contacted.  Plan to repeat in 2 weeks.  If remains elevated, anticipate restaging studies.   I discussed the assessment and treatment plan with the patient.  The patient was provided an opportunity to ask questions and all were answered.  The patient agreed with the plan and demonstrated an understanding of the instructions.  The patient was advised to call back if the symptoms worsen or if the condition fails to improve as anticipated.   Lequita Asal, MD, PhD    11/28/2018, 9:11 AM  I, Selena Batten, am acting as scribe for [employer]. Mike Gip, MD, PhD.  I, Melissa C. Mike Gip, MD, have reviewed the above documentation for accuracy and completeness, and I agree with the above.

## 2018-11-28 ENCOUNTER — Inpatient Hospital Stay (HOSPITAL_BASED_OUTPATIENT_CLINIC_OR_DEPARTMENT_OTHER): Payer: Medicare Other | Admitting: Hematology and Oncology

## 2018-11-28 ENCOUNTER — Encounter: Payer: Self-pay | Admitting: Hematology and Oncology

## 2018-11-28 ENCOUNTER — Inpatient Hospital Stay: Payer: Medicare Other | Attending: Hematology and Oncology

## 2018-11-28 ENCOUNTER — Other Ambulatory Visit: Payer: Self-pay

## 2018-11-28 VITALS — BP 162/67 | HR 65 | Temp 98.1°F | Resp 18 | Ht 69.0 in | Wt 280.2 lb

## 2018-11-28 DIAGNOSIS — Z809 Family history of malignant neoplasm, unspecified: Secondary | ICD-10-CM | POA: Insufficient documentation

## 2018-11-28 DIAGNOSIS — Z85038 Personal history of other malignant neoplasm of large intestine: Secondary | ICD-10-CM | POA: Insufficient documentation

## 2018-11-28 DIAGNOSIS — Z825 Family history of asthma and other chronic lower respiratory diseases: Secondary | ICD-10-CM | POA: Insufficient documentation

## 2018-11-28 DIAGNOSIS — Z803 Family history of malignant neoplasm of breast: Secondary | ICD-10-CM | POA: Insufficient documentation

## 2018-11-28 DIAGNOSIS — M25569 Pain in unspecified knee: Secondary | ICD-10-CM | POA: Insufficient documentation

## 2018-11-28 LAB — CBC WITH DIFFERENTIAL/PLATELET
Abs Immature Granulocytes: 0.01 10*3/uL (ref 0.00–0.07)
Basophils Absolute: 0 10*3/uL (ref 0.0–0.1)
Basophils Relative: 1 %
Eosinophils Absolute: 0.1 10*3/uL (ref 0.0–0.5)
Eosinophils Relative: 1 %
HCT: 40.7 % (ref 39.0–52.0)
Hemoglobin: 13.8 g/dL (ref 13.0–17.0)
Immature Granulocytes: 0 %
Lymphocytes Relative: 28 %
Lymphs Abs: 1.8 10*3/uL (ref 0.7–4.0)
MCH: 32.4 pg (ref 26.0–34.0)
MCHC: 33.9 g/dL (ref 30.0–36.0)
MCV: 95.5 fL (ref 80.0–100.0)
Monocytes Absolute: 0.5 10*3/uL (ref 0.1–1.0)
Monocytes Relative: 7 %
Neutro Abs: 4.1 10*3/uL (ref 1.7–7.7)
Neutrophils Relative %: 63 %
Platelets: 205 10*3/uL (ref 150–400)
RBC: 4.26 MIL/uL (ref 4.22–5.81)
RDW: 12.4 % (ref 11.5–15.5)
WBC: 6.5 10*3/uL (ref 4.0–10.5)
nRBC: 0 % (ref 0.0–0.2)

## 2018-11-28 LAB — COMPREHENSIVE METABOLIC PANEL
ALT: 38 U/L (ref 0–44)
AST: 34 U/L (ref 15–41)
Albumin: 3.9 g/dL (ref 3.5–5.0)
Alkaline Phosphatase: 66 U/L (ref 38–126)
Anion gap: 8 (ref 5–15)
BUN: 14 mg/dL (ref 8–23)
CO2: 26 mmol/L (ref 22–32)
Calcium: 9.1 mg/dL (ref 8.9–10.3)
Chloride: 103 mmol/L (ref 98–111)
Creatinine, Ser: 0.75 mg/dL (ref 0.61–1.24)
GFR calc Af Amer: >60 mL/min (ref >60)
GFR calc non Af Amer: >60 mL/min (ref >60)
Glucose, Bld: 102 mg/dL — ABNORMAL HIGH (ref 70–99)
Potassium: 3.9 mmol/L (ref 3.5–5.1)
Sodium: 137 mmol/L (ref 135–145)
Total Bilirubin: 0.7 mg/dL (ref 0.3–1.2)
Total Protein: 7.2 g/dL (ref 6.5–8.1)

## 2018-11-28 NOTE — Progress Notes (Signed)
No new changes noted today 

## 2018-11-29 ENCOUNTER — Telehealth: Payer: Self-pay

## 2018-11-29 ENCOUNTER — Other Ambulatory Visit: Payer: Self-pay

## 2018-11-29 DIAGNOSIS — Z85038 Personal history of other malignant neoplasm of large intestine: Secondary | ICD-10-CM

## 2018-11-29 DIAGNOSIS — R970 Elevated carcinoembryonic antigen [CEA]: Secondary | ICD-10-CM

## 2018-11-29 LAB — CEA: CEA: 7 ng/mL — ABNORMAL HIGH (ref 0.0–4.7)

## 2018-11-29 NOTE — Telephone Encounter (Signed)
-----   Message from Lequita Asal, MD sent at 11/29/2018  9:25 AM EDT ----- Regarding: Please call patient  CEA is elevated.  Recheck in 2 weeks.  If still elevated, would pursue imaging.  M ----- Message ----- From: Buel Ream, Lab In [place] Sent: 11/28/2018   9:04 AM EDT To: Lequita Asal, MD

## 2018-11-29 NOTE — Telephone Encounter (Signed)
Spoke with patient's wife and informed her of elevated CEA levels. Informed her Dr. Mike Gip would like to recheck CEA in 2 weeks and if it continues to be elevated, then we would pursue imaging at that time. Wife verbalizes understanding and denies any further questions.

## 2018-12-13 ENCOUNTER — Other Ambulatory Visit: Payer: Self-pay

## 2018-12-13 ENCOUNTER — Inpatient Hospital Stay: Payer: Medicare Other

## 2018-12-13 DIAGNOSIS — Z85038 Personal history of other malignant neoplasm of large intestine: Secondary | ICD-10-CM

## 2018-12-13 DIAGNOSIS — R970 Elevated carcinoembryonic antigen [CEA]: Secondary | ICD-10-CM

## 2018-12-13 LAB — CBC WITH DIFFERENTIAL/PLATELET
Abs Immature Granulocytes: 0.03 10*3/uL (ref 0.00–0.07)
Basophils Absolute: 0.1 10*3/uL (ref 0.0–0.1)
Basophils Relative: 1 %
Eosinophils Absolute: 0.2 10*3/uL (ref 0.0–0.5)
Eosinophils Relative: 2 %
HCT: 41.8 % (ref 39.0–52.0)
Hemoglobin: 14.1 g/dL (ref 13.0–17.0)
Immature Granulocytes: 0 %
Lymphocytes Relative: 33 %
Lymphs Abs: 3 10*3/uL (ref 0.7–4.0)
MCH: 31.8 pg (ref 26.0–34.0)
MCHC: 33.7 g/dL (ref 30.0–36.0)
MCV: 94.4 fL (ref 80.0–100.0)
Monocytes Absolute: 0.7 10*3/uL (ref 0.1–1.0)
Monocytes Relative: 8 %
Neutro Abs: 5.1 10*3/uL (ref 1.7–7.7)
Neutrophils Relative %: 56 %
Platelets: 229 10*3/uL (ref 150–400)
RBC: 4.43 MIL/uL (ref 4.22–5.81)
RDW: 12.1 % (ref 11.5–15.5)
WBC: 9.1 10*3/uL (ref 4.0–10.5)
nRBC: 0 % (ref 0.0–0.2)

## 2018-12-13 LAB — COMPREHENSIVE METABOLIC PANEL
ALT: 35 U/L (ref 0–44)
AST: 33 U/L (ref 15–41)
Albumin: 3.8 g/dL (ref 3.5–5.0)
Alkaline Phosphatase: 66 U/L (ref 38–126)
Anion gap: 9 (ref 5–15)
BUN: 17 mg/dL (ref 8–23)
CO2: 25 mmol/L (ref 22–32)
Calcium: 9.3 mg/dL (ref 8.9–10.3)
Chloride: 103 mmol/L (ref 98–111)
Creatinine, Ser: 0.92 mg/dL (ref 0.61–1.24)
GFR calc Af Amer: >60 mL/min (ref >60)
GFR calc non Af Amer: >60 mL/min (ref >60)
Glucose, Bld: 103 mg/dL — ABNORMAL HIGH (ref 70–99)
Potassium: 4 mmol/L (ref 3.5–5.1)
Sodium: 137 mmol/L (ref 135–145)
Total Bilirubin: 0.7 mg/dL (ref 0.3–1.2)
Total Protein: 7.3 g/dL (ref 6.5–8.1)

## 2018-12-14 LAB — CEA: CEA: 7.4 ng/mL — ABNORMAL HIGH (ref 0.0–4.7)

## 2018-12-19 NOTE — Progress Notes (Signed)
[facility]  [address] [place],  [address] Phone: [phone]  Fax: [phone]   Clinic Day:  12/24/2018  Referring physician: Tracie Harrier, MD  Chief Complaint: Jared Gutierrez is a 78 y.o. male with stage I colon cancer who is seen for review of interval imaging studies and discussion regarding direction of therapy.   HPI: The patient was last seen in the medical oncology clinic on 11/28/2018.  At that time, he was doing well.  He voiced no concerns. Exam was stable. Hematocrit 40.7, hemoglobin 13.8, MCV 95.5.  CEA was 7.0 (prior value 4.0 on 05/01/2018).  Repeat CEA 2 weeks later on 12/13/2018 revealed a CEA of 7.4.  Chest, abdomen and pelvis CT on 12/20/2018 revealed postoperative findings of transverse colon resection without evidence of recurrent mass, definite lymphadenopathy, or distant metastatic disease to explain rising CEA. There were prominent sub-centimeter retroperitoneal and iliac lymph nodes, uncertain significance.  There were postoperative findings about the left renal hilum, of uncertain nature, with a broad-based postoperative fat containing left-sided lumbar hernia.   There was bibasilar bronchiectasis and scarring with multiple post infectious or inflammatory pneumatoceles, unchanged in comparison to CT date 06/23/2011.   During the interim, the patient feels "alright". He notes right side groin pain x 3 weeks ago. He denies edema, or a hernia. He denies any pain or swelling in his testicles. He denies any fevers.   He has an increase in urgency when he "stands up from sitting".  He notes frequency but denies any pain during urination. He reports no other changes in his urine.    Past Medical History:  Diagnosis Date  . Arthritis   . Cancer ([place])   . Cavitary lesion of lung   . Chicken pox   . Colon cancer ([place])   . Hypertension   . Lipoma of colon   . Nephrolithiasis   . Nephrolithiasis   . Obesity   .  Shingles   . Tubular adenoma of colon    multiple fragments    Past Surgical History:  Procedure Laterality Date  . COLONOSCOPY N/A 10/02/2014   Procedure: COLONOSCOPY;  Surgeon: Josefine Class, MD;  Location: [facility] ENDOSCOPY;  Service: Endoscopy;  Laterality: N/A;  . COLONOSCOPY WITH PROPOFOL N/A 02/16/2017   Procedure: COLONOSCOPY WITH PROPOFOL;  Surgeon: Manya Silvas, MD;  Location: [facility] ENDOSCOPY;  Service: Endoscopy;  Laterality: N/A;    Family History  Problem Relation Age of Onset  . Cancer Mother   . Breast cancer Mother   . COPD Father     Social History:  reports that he has never smoked. He has never used smokeless tobacco. He reports current alcohol use. He reports that he does not use drugs. 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 The patient is accompanied by his wife, [name], over the phone [phone]) today.  Allergies: No Known Allergies  Current Medications: Current Outpatient Medications  Medication Sig Dispense Refill  . aspirin 81 MG tablet Take 81 mg by mouth daily.    . ferrous sulfate 325 (65 FE) MG EC tablet Take by mouth.    [name] glucosamine-chondroitin 500-400 MG tablet Take 1 tablet by mouth daily.    . meloxicam (MOBIC) 15 MG tablet Take 15 mg by mouth daily.    [name] olmesartan (BENICAR) 20 MG tablet Take 20 mg by mouth daily.    [name] oxybutynin (DITROPAN) 5 MG tablet Take 5 mg by  mouth 3 (three) times daily.    . Probiotic Product (PROBIOTIC DAILY PO) Take 1 tablet by mouth daily.     No current facility-administered medications for this visit.     Review of Systems  Constitutional: Positive for weight loss (down 1 pound). Negative for chills, diaphoresis, fever and malaise/fatigue.       Feels "alright".  HENT: Negative.  Negative for congestion, ear pain, nosebleeds, sinus pain and sore throat.   Eyes: Negative.  Negative for blurred vision, double vision, photophobia and pain.  Respiratory: Negative.  Negative  for cough, hemoptysis, sputum production and shortness of breath.   Cardiovascular: Negative.  Negative for chest pain, palpitations, orthopnea, leg swelling and PND.  Gastrointestinal: Negative.  Negative for abdominal pain, blood in stool, constipation, diarrhea, melena, nausea and vomiting.  Genitourinary: Positive for frequency and urgency (upon standing). Negative for dysuria and hematuria.  Musculoskeletal: Negative for back pain, falls, joint pain (chronic knee pain), myalgias and neck pain.       Right side groin pain.  Skin: Negative.  Negative for itching and rash.  Neurological: Negative.  Negative for dizziness, tremors, sensory change, speech change, focal weakness, weakness and headaches.  Endo/Heme/Allergies: Negative.  Does not bruise/bleed easily.  Psychiatric/Behavioral: Negative.  Negative for depression and memory loss. The patient is not nervous/anxious and does not have insomnia.   All other systems reviewed and are negative.  Performance status (ECOG): 0  Vitals Blood pressure (!) 167/88, pulse 60, temperature 97.7 F (36.5 C), temperature source Tympanic, resp. rate 18, height 5\' 9"  (1.753 m), weight 279 lb 5.2 oz (126.7 kg), SpO2 98 %.  Physical Exam  Constitutional: He is oriented to person, place, and time. He appears well-developed and well-nourished. No distress.  HENT:  Head: Normocephalic and atraumatic.  Wearing cap. Short gray/white hair and beard.  Eyes: Conjunctivae and EOM are normal. No scleral icterus.  Hazel eyes.  Abdominal: Soft. Bowel sounds are normal. He exhibits no mass. There is no abdominal tenderness. There is no rebound and no guarding.  Slight right groin tenderness without hernia.  Musculoskeletal: Normal range of motion.        General: Edema (slight right lower extremity edema) present. No tenderness or deformity.  Lymphadenopathy:    He has no axillary adenopathy.       Right: No supraclavicular adenopathy present.       Left: No  supraclavicular adenopathy present.  Neurological: He is alert and oriented to person, place, and time. He has normal reflexes.  Skin: Skin is warm and dry. No rash noted. He is not diaphoretic. No erythema. No pallor.  Psychiatric: He has a normal mood and affect. His behavior is normal. Judgment and thought content normal.  Nursing note and vitals reviewed.   No visits with results within 3 Day(s) from this visit.  Latest known visit with results is:  Appointment on 12/13/2018  Component Date Value Ref Range Status  . CEA 12/13/2018 7.4* 0.0 - 4.7 ng/mL Final   Comment: (NOTE)                             Nonsmokers          <3.9                             Smokers             <5.6 Roche  [employer] Electrochemiluminescence Immunoassay (ECLIA) Values obtained with different assay methods or kits cannot be used interchangeably.  Results cannot be interpreted as absolute evidence of the presence or absence of malignant disease. Performed At: [facility] [place], [place] [id number] Rush Farmer MD UU:[id number]   . Sodium 12/13/2018 137  135 - 145 mmol/L Final  . Potassium 12/13/2018 4.0  3.5 - 5.1 mmol/L Final  . Chloride 12/13/2018 103  98 - 111 mmol/L Final  . CO2 12/13/2018 25  22 - 32 mmol/L Final  . Glucose, Bld 12/13/2018 103* 70 - 99 mg/dL Final  . BUN 12/13/2018 17  8 - 23 mg/dL Final  . Creatinine, Ser 12/13/2018 0.92  0.61 - 1.24 mg/dL Final  . Calcium 12/13/2018 9.3  8.9 - 10.3 mg/dL Final  . Total Protein 12/13/2018 7.3  6.5 - 8.1 g/dL Final  . Albumin 12/13/2018 3.8  3.5 - 5.0 g/dL Final  . AST 12/13/2018 33  15 - 41 U/L Final  . ALT 12/13/2018 35  0 - 44 U/L Final  . Alkaline Phosphatase 12/13/2018 66  38 - 126 U/L Final  . Total Bilirubin 12/13/2018 0.7  0.3 - 1.2 mg/dL Final  . GFR calc non Af Amer 12/13/2018 >60  >60 mL/min Final  . GFR calc Af Amer 12/13/2018 >60  >60 mL/min Final  . Anion gap 12/13/2018 9  5 - 15 Final   Performed  at [facility], [address] [place], [place] [address]  . WBC 12/13/2018 9.1  4.0 - 10.5 K/uL Final  . RBC 12/13/2018 4.43  4.22 - 5.81 MIL/uL Final  . Hemoglobin 12/13/2018 14.1  13.0 - 17.0 g/dL Final  . HCT 12/13/2018 41.8  39.0 - 52.0 % Final  . MCV 12/13/2018 94.4  80.0 - 100.0 fL Final  . MCH 12/13/2018 31.8  26.0 - 34.0 pg Final  . MCHC 12/13/2018 33.7  30.0 - 36.0 g/dL Final  . RDW 12/13/2018 12.1  11.5 - 15.5 % Final  . Platelets 12/13/2018 229  150 - 400 K/uL Final  . nRBC 12/13/2018 0.0  0.0 - 0.2 % Final  . Neutrophils Relative % 12/13/2018 56  % Final  . Neutro Abs 12/13/2018 5.1  1.7 - 7.7 K/uL Final  . Lymphocytes Relative 12/13/2018 33  % Final  . Lymphs Abs 12/13/2018 3.0  0.7 - 4.0 K/uL Final  . Monocytes Relative 12/13/2018 8  % Final  . Monocytes Absolute 12/13/2018 0.7  0.1 - 1.0 K/uL Final  . Eosinophils Relative 12/13/2018 2  % Final  . Eosinophils Absolute 12/13/2018 0.2  0.0 - 0.5 K/uL Final  . Basophils Relative 12/13/2018 1  % Final  . Basophils Absolute 12/13/2018 0.1  0.0 - 0.1 K/uL Final  . Immature Granulocytes 12/13/2018 0  % Final  . Abs Immature Granulocytes 12/13/2018 0.03  0.00 - 0.07 K/uL Final   Performed at [facility], [address] [place], [place] [address]    Assessment:  Jared Gutierrez is a 78 y.o. male with stage I colon cancers/p transverse colectomy on 10/17/2013. 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 Tumor extended into the submucosa. Margins were negative. There was lymphovascular invasion. 14 lymph nodes were negative.Pathologic stagewas T1 N0.  Colonoscopyon 10/02/2014 noted several 3 mm polyps and an 8 mm polyp in the cecum and transverse colon. Pathology revealed tubular adenomas negative for high-grade dysplasia and malignancy.  Colonoscopyon 02/16/2017 revealed 2 diminutive polyps in  the descending colon. Pathology revealed tubular adenomas without dysplasia or malignancy.  CEAhas been followed: 3.1 on 04/27/2014, 2.3 on 11/11/2014, 3.0 on 05/12/2015, 2.7 on 11/10/2015, 3.2 on 05/10/2016, 2.8 on 11/01/2016, 3.3 on 04/25/2017, 3.1 on 10/31/2017, 4.0 on 05/01/2018, and 7.0 on 11/28/2018, and 7.4 on 12/13/2018.  Chest, abdomen and pelvis CT on 12/20/2018 revealed postoperative findings of transverse colon resection without evidence of recurrent mass, definite lymphadenopathy, or distant metastatic disease to explain rising CEA. There were prominent sub-centimeter retroperitoneal and iliac lymph nodes, uncertain significance.  There were postoperative findings about the left renal hilum, of uncertain nature, with a broad-based postoperative fat containing left-sided lumbar hernia.   There was bibasilar bronchiectasis and scarring with multiple post infectious or inflammatory pneumatoceles, unchanged in comparison to CT date 06/23/2011.   Symptomatically,he feels "alright".  Exam reveals subtle right lower extremity edema.  Plan: 1.Stage I colon cancer  Clinically, he is doing well.    CEA was 7.0 on 11/28/2018 and 7.4 on 12/13/2018.  Chest abdomen and pelvic CT scans personally reviewed.  Agree with radiology interpretation.   Unclear significance prominent subcentimeter retroperitoneal and iliac lymph nodes.   Discuss plan for close monitoring repeat CEA and possibly imaging (CT or PET scan) in 3 months.             Continue close surveillance. 2.   Right lower extremity edema   Right lower extremity duplex today. 3.   Urgency and frequency  UA and culture today. 4.   RTC in 3 months for labs (CBC with diff, CMP, CEA). 5.   RTC as previously scheduled.  Addendum:   Right lower extremity duplex today revealed no femoral-popliteal DVT in the visualized veins.  He has a small right Baker's cyst.  I discussed the assessment and treatment plan with the patient.  The  patient was provided an opportunity to ask questions and all were answered.  The patient agreed with the plan and demonstrated an understanding of the instructions.  The patient was advised to call back if the symptoms worsen or if the condition fails to improve as anticipated.  I provided 17 minutes of face-to-face time during this this encounter and > 50% was spent counseling as documented under my assessment and plan.    Lequita Asal, MD, PhD    12/24/2018, 9:15 AM  I, Selena Batten, am acting as scribe for [employer]. Mike Gip, MD, PhD.  I,  C. Mike Gip, MD, have reviewed the above documentation for accuracy and completeness, and I agree with the above.

## 2018-12-20 ENCOUNTER — Ambulatory Visit
Admission: RE | Admit: 2018-12-20 | Discharge: 2018-12-20 | Disposition: A | Discharge status: Home / Self Care | Payer: Medicare Other | Source: Ambulatory Visit | Attending: Hematology and Oncology | Admitting: Hematology and Oncology

## 2018-12-20 ENCOUNTER — Other Ambulatory Visit: Payer: Self-pay

## 2018-12-20 DIAGNOSIS — Z85038 Personal history of other malignant neoplasm of large intestine: Secondary | ICD-10-CM | POA: Diagnosis not present

## 2018-12-20 MED ORDER — IOHEXOL 300 MG/ML  SOLN
100.0000 mL | Freq: Once | INTRAMUSCULAR | Status: AC | PRN
  Administered 2018-12-20: 100 mL via INTRAVENOUS

## 2018-12-24 ENCOUNTER — Ambulatory Visit
Admission: RE | Admit: 2018-12-24 | Discharge: 2018-12-24 | Disposition: A | Discharge status: Home / Self Care | Payer: Medicare Other | Source: Ambulatory Visit | Attending: Hematology and Oncology | Admitting: Hematology and Oncology

## 2018-12-24 ENCOUNTER — Other Ambulatory Visit: Payer: Self-pay

## 2018-12-24 ENCOUNTER — Inpatient Hospital Stay: Payer: Medicare Other | Attending: Hematology and Oncology | Admitting: Hematology and Oncology

## 2018-12-24 ENCOUNTER — Encounter: Payer: Self-pay | Admitting: Hematology and Oncology

## 2018-12-24 VITALS — BP 167/88 | HR 60 | Temp 97.7°F | Resp 18 | Ht 69.0 in | Wt 279.3 lb

## 2018-12-24 DIAGNOSIS — R600 Localized edema: Secondary | ICD-10-CM

## 2018-12-24 DIAGNOSIS — R350 Frequency of micturition: Secondary | ICD-10-CM

## 2018-12-24 DIAGNOSIS — M7121 Synovial cyst of popliteal space [Baker], right knee: Secondary | ICD-10-CM | POA: Insufficient documentation

## 2018-12-24 DIAGNOSIS — I10 Essential (primary) hypertension: Secondary | ICD-10-CM | POA: Diagnosis not present

## 2018-12-24 DIAGNOSIS — R970 Elevated carcinoembryonic antigen [CEA]: Secondary | ICD-10-CM | POA: Diagnosis not present

## 2018-12-24 DIAGNOSIS — Z791 Long term (current) use of non-steroidal anti-inflammatories (NSAID): Secondary | ICD-10-CM | POA: Insufficient documentation

## 2018-12-24 DIAGNOSIS — Z85038 Personal history of other malignant neoplasm of large intestine: Secondary | ICD-10-CM | POA: Diagnosis present

## 2018-12-24 DIAGNOSIS — R1030 Lower abdominal pain, unspecified: Secondary | ICD-10-CM | POA: Diagnosis not present

## 2018-12-24 DIAGNOSIS — Z803 Family history of malignant neoplasm of breast: Secondary | ICD-10-CM | POA: Insufficient documentation

## 2018-12-24 DIAGNOSIS — Z7982 Long term (current) use of aspirin: Secondary | ICD-10-CM | POA: Insufficient documentation

## 2018-12-24 LAB — URINALYSIS, COMPLETE (UACMP) WITH MICROSCOPIC
Bacteria, UA: NONE SEEN
Bilirubin Urine: NEGATIVE
Glucose, UA: NEGATIVE mg/dL
Ketones, ur: NEGATIVE mg/dL
Leukocytes,Ua: NEGATIVE
Nitrite: NEGATIVE
Protein, ur: NEGATIVE mg/dL
Specific Gravity, Urine: 1.02 (ref 1.005–1.030)
WBC, UA: NONE SEEN WBC/hpf (ref 0–5)
pH: 7 (ref 5.0–8.0)

## 2018-12-24 NOTE — Progress Notes (Signed)
The patient c/o right side pain in groin area x 3 weeks ago

## 2018-12-25 LAB — URINE CULTURE: Culture: <10000 — AB

## 2019-01-27 DIAGNOSIS — Z6841 Body Mass Index (BMI) 40.0 and over, adult: Secondary | ICD-10-CM | POA: Insufficient documentation

## 2019-03-25 ENCOUNTER — Other Ambulatory Visit: Payer: Self-pay

## 2019-03-25 ENCOUNTER — Inpatient Hospital Stay: Payer: Medicare Other | Attending: Hematology and Oncology

## 2019-03-25 ENCOUNTER — Telehealth: Payer: Self-pay

## 2019-03-25 DIAGNOSIS — R970 Elevated carcinoembryonic antigen [CEA]: Secondary | ICD-10-CM

## 2019-03-25 DIAGNOSIS — Z7982 Long term (current) use of aspirin: Secondary | ICD-10-CM | POA: Diagnosis not present

## 2019-03-25 DIAGNOSIS — Z791 Long term (current) use of non-steroidal anti-inflammatories (NSAID): Secondary | ICD-10-CM | POA: Diagnosis not present

## 2019-03-25 DIAGNOSIS — C184 Malignant neoplasm of transverse colon: Secondary | ICD-10-CM | POA: Diagnosis not present

## 2019-03-25 DIAGNOSIS — Z85038 Personal history of other malignant neoplasm of large intestine: Secondary | ICD-10-CM

## 2019-03-25 DIAGNOSIS — I10 Essential (primary) hypertension: Secondary | ICD-10-CM | POA: Insufficient documentation

## 2019-03-25 LAB — CBC WITH DIFFERENTIAL/PLATELET
Abs Immature Granulocytes: 0.03 10*3/uL (ref 0.00–0.07)
Basophils Absolute: 0.1 10*3/uL (ref 0.0–0.1)
Basophils Relative: 1 %
Eosinophils Absolute: 0.2 10*3/uL (ref 0.0–0.5)
Eosinophils Relative: 2 %
HCT: 40.4 % (ref 39.0–52.0)
Hemoglobin: 13.8 g/dL (ref 13.0–17.0)
Immature Granulocytes: 0 %
Lymphocytes Relative: 33 %
Lymphs Abs: 2.4 10*3/uL (ref 0.7–4.0)
MCH: 31.7 pg (ref 26.0–34.0)
MCHC: 34.2 g/dL (ref 30.0–36.0)
MCV: 92.7 fL (ref 80.0–100.0)
Monocytes Absolute: 0.6 10*3/uL (ref 0.1–1.0)
Monocytes Relative: 8 %
Neutro Abs: 4.2 10*3/uL (ref 1.7–7.7)
Neutrophils Relative %: 56 %
Platelets: 215 10*3/uL (ref 150–400)
RBC: 4.36 MIL/uL (ref 4.22–5.81)
RDW: 12.2 % (ref 11.5–15.5)
WBC: 7.4 10*3/uL (ref 4.0–10.5)
nRBC: 0 % (ref 0.0–0.2)

## 2019-03-25 LAB — COMPREHENSIVE METABOLIC PANEL
ALT: 54 U/L — ABNORMAL HIGH (ref 0–44)
AST: 49 U/L — ABNORMAL HIGH (ref 15–41)
Albumin: 3.7 g/dL (ref 3.5–5.0)
Alkaline Phosphatase: 59 U/L (ref 38–126)
Anion gap: 8 (ref 5–15)
BUN: 13 mg/dL (ref 8–23)
CO2: 25 mmol/L (ref 22–32)
Calcium: 9.2 mg/dL (ref 8.9–10.3)
Chloride: 106 mmol/L (ref 98–111)
Creatinine, Ser: 0.71 mg/dL (ref 0.61–1.24)
GFR calc Af Amer: >60 mL/min (ref >60)
GFR calc non Af Amer: >60 mL/min (ref >60)
Glucose, Bld: 109 mg/dL — ABNORMAL HIGH (ref 70–99)
Potassium: 3.6 mmol/L (ref 3.5–5.1)
Sodium: 139 mmol/L (ref 135–145)
Total Bilirubin: 1 mg/dL (ref 0.3–1.2)
Total Protein: 7 g/dL (ref 6.5–8.1)

## 2019-03-25 NOTE — Telephone Encounter (Signed)
Left a message to inform the patient of his lab results, Unable to leave a message.

## 2019-03-25 NOTE — Telephone Encounter (Signed)
-----   Message from Lequita Asal, MD sent at 03/25/2019  8:53 AM EST ----- Regarding: Reepeat LFTs in 2 weeks  ----- Message ----- From: Interface, Lab In [place] Sent: 03/25/2019   8:14 AM EST To: Lequita Asal, MD

## 2019-03-26 ENCOUNTER — Other Ambulatory Visit: Payer: Self-pay | Admitting: Hematology and Oncology

## 2019-03-26 ENCOUNTER — Telehealth: Payer: Self-pay

## 2019-03-26 DIAGNOSIS — Z85038 Personal history of other malignant neoplasm of large intestine: Secondary | ICD-10-CM

## 2019-03-26 DIAGNOSIS — R970 Elevated carcinoembryonic antigen [CEA]: Secondary | ICD-10-CM

## 2019-03-26 LAB — CEA: CEA: 10.5 ng/mL — ABNORMAL HIGH (ref 0.0–4.7)

## 2019-03-26 NOTE — Telephone Encounter (Signed)
Left a message to inform the patient to contact the office as soon as possible.Per Dr Mike Gip the patient LFT's levels continue to rise. Per Dr Mike Gip she would like to speak with the patient and reorder levels in 2 weeks.

## 2019-03-26 NOTE — Telephone Encounter (Signed)
-----   Message from Lequita Asal, MD sent at 03/25/2019  8:53 AM EST ----- Regarding: Reepeat LFTs in 2 weeks  ----- Message ----- From: Interface, Lab In [place] Sent: 03/25/2019   8:14 AM EST To: Lequita Asal, MD

## 2019-04-01 ENCOUNTER — Ambulatory Visit: Payer: Medicare Other | Admitting: Hematology and Oncology

## 2019-04-01 NOTE — H&P (View-Only) (Signed)
[name]  [address], [address] [name], [name] [address] Phone: [phone]  Fax: [phone]   Telemedicine Office Visit:  04/03/2019  Referring physician: Tracie Harrier, MD  I connected with Jared Gutierrez on 04/03/2019 at 11:48 AM by videoconferencing and verified that I was speaking with the correct person using 2 identifiers.  The patient was at his mother-in-law's home.  I discussed the limitations, risk, security and privacy concerns of performing an evaluation and management service by videoconferencing and the availability of in person appointments.  I also discussed with the patient that there may be a patient responsible charge related to this service.  The patient expressed understanding and agreed to proceed.   Chief Complaint: Jared Gutierrez is a 78 y.o. male with stage I colon cancer who is seen for a 3 month assessment and review of interval PET scan.  HPI: The patient was last seen in the medical oncology clinic on 12/24/2018. At that time, he felt "all right".  Exam revealed subtle right lower extremity edema. Urinalysis showed trace amounts of hemoglobin urine dipstick. CEA was 7.4 on 12/13/2018.  I discussed a repeat CEA and possible PET or CT scan. Surveillance continued.   Right lower extremity duplex today revealed no femoral-popliteal DVT in the visualized veins.  He has a small right Baker's cyst  Labs on 03/25/2019: Hematocrit 40.4, hematocrit 13.8, platelets 215,000, WBC 7,400. AST 49. ALT 54. CEA was 10.5.   PET scan on 04/02/2019 revealed a  2.4 x 2.3 cm (SUV 11) hypermetabolic soft tissue density caudal and anterior to the pancreatic neck favored to represent isolated peritoneal or nodal metastasis in the setting of prior transverse colonic resection (expected primary drainage). Although this was immediately adjacent to the pancreas, a fat plane was maintained, arguing strongly against a pancreatic primary. Otherwise, there  was no evidence of hypermetabolic metastasis. There was mild degradation secondary to patient body habitus.   During the interim, the patient has been doing "ok". He has no complaints.    Past Medical History:  Diagnosis Date  . Arthritis   . Cancer ([place])   . Cavitary lesion of lung   . Chicken pox   . Colon cancer ([place])   . Hypertension   . Lipoma of colon   . Nephrolithiasis   . Nephrolithiasis   . Obesity   . Shingles   . Tubular adenoma of colon    multiple fragments    Past Surgical History:  Procedure Laterality Date  . COLONOSCOPY N/A 10/02/2014   Procedure: COLONOSCOPY;  Surgeon: Josefine Class, MD;  Location: [facility] ENDOSCOPY;  Service: Endoscopy;  Laterality: N/A;  . COLONOSCOPY WITH PROPOFOL N/A 02/16/2017   Procedure: COLONOSCOPY WITH PROPOFOL;  Surgeon: Manya Silvas, MD;  Location: [facility] ENDOSCOPY;  Service: Endoscopy;  Laterality: N/A;    Family History  Problem Relation Age of Onset  . Cancer Mother   . Breast cancer Mother   . COPD Father     Social History:  reports that he has never smoked. He has never used smokeless tobacco. He reports current alcohol use. He reports that he does not use drugs. 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 He is married and his wife's name is Jared Gutierrez [phone]). The patient is accompanied by his wife today.  Participants in the patient's visit and their role in the encounter included the patient, his wife, and Vito Berger, CMA, today.  The intake  visit was provided by Vito Berger, CMA.  Allergies: No Known Allergies  Current Medications: Current Outpatient Medications  Medication Sig Dispense Refill  . aspirin 81 MG tablet Take 81 mg by mouth daily.    . ferrous sulfate 325 (65 FE) MG EC tablet Take by mouth.    [name] glucosamine-chondroitin 500-400 MG tablet Take 1 tablet by mouth daily.    . meloxicam (MOBIC) 15 MG tablet Take 15 mg by mouth daily.    [name] olmesartan (BENICAR)  20 MG tablet Take 20 mg by mouth daily.    [name] oxybutynin (DITROPAN) 5 MG tablet Take 5 mg by mouth 3 (three) times daily.    . Probiotic Product (PROBIOTIC DAILY PO) Take 1 tablet by mouth daily.     No current facility-administered medications for this visit.     Review of Systems  Constitutional: Negative for chills, diaphoresis, fever, malaise/fatigue and weight loss.       Doing "ok".  HENT: Negative.  Negative for congestion, ear pain, nosebleeds, sinus pain and sore throat.   Eyes: Negative.  Negative for blurred vision, double vision, photophobia and pain.  Respiratory: Negative.  Negative for cough, hemoptysis, sputum production and shortness of breath.   Cardiovascular: Negative.  Negative for chest pain, palpitations, orthopnea, leg swelling and PND.  Gastrointestinal: Negative.  Negative for abdominal pain, blood in stool, constipation, diarrhea, melena, nausea and vomiting.  Genitourinary: Negative.  Negative for dysuria, frequency, hematuria and urgency.  Musculoskeletal: Positive for joint pain (chronic). Negative for back pain, falls, myalgias and neck pain.  Skin: Negative.  Negative for itching and rash.  Neurological: Negative.  Negative for dizziness, tremors, sensory change, speech change, focal weakness, weakness and headaches.  Endo/Heme/Allergies: Negative.  Does not bruise/bleed easily.  Psychiatric/Behavioral: Negative.  Negative for depression and memory loss. The patient is not nervous/anxious and does not have insomnia.   All other systems reviewed and are negative.   Performance status (ECOG):  0  Physical Exam  Constitutional: He is oriented to person, place, and time. He appears well-developed and well-nourished. No distress.  HENT:  Head: Normocephalic and atraumatic.  Near alopecia. Short gray/white hair and full white beard.  Eyes: Conjunctivae and EOM are normal. No scleral icterus.  Hazel eyes.  Neurological: He is alert and oriented to person,  place, and time.  Skin: He is not diaphoretic.  Psychiatric: He has a normal mood and affect. His behavior is normal. Judgment and thought content normal.  Nursing note reviewed.   Hospital Outpatient Visit on 04/02/2019  Component Date Value Ref Range Status  . Glucose-Capillary 04/02/2019 93  70 - 99 mg/dL Final    Assessment:  Jared Gutierrez is a 78 y.o. male with stage I colon cancers/p transverse colectomy on 10/17/2013. 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 Tumor extended into the submucosa. Margins were negative. There was lymphovascular invasion. 14 lymph nodes were negative.Pathologic stagewas T1 N0.  Colonoscopyon 10/02/2014 noted several 3 mm polyps and an 8 mm polyp in the cecum and transverse colon. Pathology revealed tubular adenomas negative for high-grade dysplasia and malignancy. Colonoscopyon 02/16/2017 revealed 2 diminutive polyps in the descending colon. Pathology revealed tubular adenomas without dysplasia or malignancy.  CEAhas been followed: 3.1 on 04/27/2014, 2.3 on 11/11/2014, 3.0 on 05/12/2015, 2.7 on 11/10/2015, 3.2 on 05/10/2016, 2.8 on 11/01/2016, 3.3 on 04/25/2017, 3.1 on 10/31/2017, 4.0 on 05/01/2018, and 7.0 on 11/28/2018, 7.4 on 12/13/2018, and 10.5  on 03/25/2019.  Chest, abdomen and pelvis CT on 12/20/2018 revealed postoperative findings of transverse colon resection without evidence of recurrent mass, definite lymphadenopathy, or distant metastatic disease to explain rising CEA. There were prominent sub-centimeter retroperitoneal and iliac lymph nodes, uncertain significance.  There were postoperative findings about the left renal hilum, of uncertain nature, with a broad-based postoperative fat containing left-sided lumbar hernia.   There was bibasilar bronchiectasis and scarring with multiple post infectious or inflammatory pneumatoceles,  unchanged in comparison to CT date 06/23/2011.  PET scan on 04/02/2019 revealed a 2.4 x 2.3 cm (SUV 11) hypermetabolic soft tissue density caudal and anterior to the pancreatic neck favored to represent isolated peritoneal or nodal metastasis in the setting of prior transverse colonic resection (expected primary drainage). Although this was immediately adjacent to the pancreas, a fat plane was maintained, arguing strongly against a pancreatic primary. Otherwise, there was no evidence of hypermetabolic metastasis.  Symptomatically, he denies any complaint.  Plan: 1.   Review interim labs. 2.   Stage I colon cancer             Clinically, he continues to do well.               CEA was 7.0 on 11/28/2018, 7.4 on 12/13/2018, and 10.5 on 03/25/2019.             Chest abdomen and pelvic CT on 12/20/2018.revealed prominent subcentimeter retroperitoneal and iliac lymph nodes.  PET scan on 04/02/2019 revealed a 2.4 x 2.3 cm (SUV 11) soft tissue density caudal and anterior to the pancreatic neck.   Etiology is felt to represent isolated peritoneal or nodal metastasis.   Images personally reviewed with patient.     Copy of report sent to patient.   Images reviewed with radiology.  Discuss obtaining a biopsy.   MD to contact interventional radiology at [facility] and interventional GI at [facility].   Patient agreeable to a biopsy.  3.   Right lower extremity edema              Right lower extremity duplex at last visit revealed no DVT, but a Baker's cyst. 4.   RTC after biopsy for MD assessment, review of biopsy, and discussion regarding direction of therapy.  Addendum:  I spoke with interventional radiology.  Biopsy unable to be performed.  I spoke with Mariea Clonts, RN.  EUS to be performed by Dr Mont Dutton at [place] on 04/17/2019.  I discussed the assessment and treatment plan with the patient.  The patient was provided an opportunity to ask questions and all were answered.  The patient agreed with the plan  and demonstrated an understanding of the instructions.  The patient was advised to call back or seek an in person evaluation if the symptoms worsen or if the condition fails to improve as anticipated.  I provided 12 minutes (11:48 AM - 11:59 AM) of face-to-face video visit time during this this encounter and > 50% was spent counseling as documented under my assessment and plan.  I provided these services from the [facility] office.   Nolon Stalls, MD, PhD  04/03/2019, 11:48 AM  I, Edman Circle, am acting as Education administrator for [employer]. Mike Gip, MD, PhD.  I,  C. Mike Gip, MD, have reviewed the above documentation for accuracy and completeness, and I agree with the above.

## 2019-04-01 NOTE — Progress Notes (Signed)
[facility]  [address] [place], [place] [address] Phone: [phone]  Fax: [phone]   Telemedicine Office Visit:  04/03/2019  Referring physician: Tracie Harrier, MD  I connected with Jared Gutierrez on 04/03/2019 at 11:48 AM by videoconferencing and verified that I was speaking with the correct person using 2 identifiers.  The patient was at his mother-in-law's home.  I discussed the limitations, risk, security and privacy concerns of performing an evaluation and management service by videoconferencing and the availability of in person appointments.  I also discussed with the patient that there may be a patient responsible charge related to this service.  The patient expressed understanding and agreed to proceed.   Chief Complaint: Jared Gutierrez is a 78 y.o. male with stage I colon cancer who is seen for a 3 month assessment and review of interval PET scan.  HPI: The patient was last seen in the medical oncology clinic on 12/24/2018. At that time, he felt "all right".  Exam revealed subtle right lower extremity edema. Urinalysis showed trace amounts of hemoglobin urine dipstick. CEA was 7.4 on 12/13/2018.  I discussed a repeat CEA and possible PET or CT scan. Surveillance continued.   Right lower extremity duplex today revealed no femoral-popliteal DVT in the visualized veins.  He has a small right Baker's cyst  Labs on 03/25/2019: Hematocrit 40.4, hematocrit 13.8, platelets 215,000, WBC 7,400. AST 49. ALT 54. CEA was 10.5.   PET scan on 04/02/2019 revealed a  2.4 x 2.3 cm (SUV 11) hypermetabolic soft tissue density caudal and anterior to the pancreatic neck favored to represent isolated peritoneal or nodal metastasis in the setting of prior transverse colonic resection (expected primary drainage). Although this was immediately adjacent to the pancreas, a fat plane was maintained, arguing strongly against a pancreatic primary. Otherwise, there  was no evidence of hypermetabolic metastasis. There was mild degradation secondary to patient body habitus.   During the interim, the patient has been doing "ok". He has no complaints.    Past Medical History:  Diagnosis Date  . Arthritis   . Cancer ([place])   . Cavitary lesion of lung   . Chicken pox   . Colon cancer ([place])   . Hypertension   . Lipoma of colon   . Nephrolithiasis   . Nephrolithiasis   . Obesity   . Shingles   . Tubular adenoma of colon    multiple fragments    Past Surgical History:  Procedure Laterality Date  . COLONOSCOPY N/A 10/02/2014   Procedure: COLONOSCOPY;  Surgeon: Josefine Class, MD;  Location: [facility] ENDOSCOPY;  Service: Endoscopy;  Laterality: N/A;  . COLONOSCOPY WITH PROPOFOL N/A 02/16/2017   Procedure: COLONOSCOPY WITH PROPOFOL;  Surgeon: Manya Silvas, MD;  Location: [facility] ENDOSCOPY;  Service: Endoscopy;  Laterality: N/A;    Family History  Problem Relation Age of Onset  . Cancer Mother   . Breast cancer Mother   . COPD Father     Social History:  reports that he has never smoked. He has never used smokeless tobacco. He reports current alcohol use. He reports that he does not use drugs. 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 He is married and his wife's name is [name] [phone]). The patient is accompanied by his wife today.  Participants in the patient's visit and their role in the encounter included the patient, his wife, and Vito Berger, CMA, today.  The intake  visit was provided by Vito Berger, CMA.  Allergies: No Known Allergies  Current Medications: Current Outpatient Medications  Medication Sig Dispense Refill  . aspirin 81 MG tablet Take 81 mg by mouth daily.    . ferrous sulfate 325 (65 FE) MG EC tablet Take by mouth.    [name] glucosamine-chondroitin 500-400 MG tablet Take 1 tablet by mouth daily.    . meloxicam (MOBIC) 15 MG tablet Take 15 mg by mouth daily.    [name] olmesartan (BENICAR)  20 MG tablet Take 20 mg by mouth daily.    [name] oxybutynin (DITROPAN) 5 MG tablet Take 5 mg by mouth 3 (three) times daily.    . Probiotic Product (PROBIOTIC DAILY PO) Take 1 tablet by mouth daily.     No current facility-administered medications for this visit.     Review of Systems  Constitutional: Negative for chills, diaphoresis, fever, malaise/fatigue and weight loss.       Doing "ok".  HENT: Negative.  Negative for congestion, ear pain, nosebleeds, sinus pain and sore throat.   Eyes: Negative.  Negative for blurred vision, double vision, photophobia and pain.  Respiratory: Negative.  Negative for cough, hemoptysis, sputum production and shortness of breath.   Cardiovascular: Negative.  Negative for chest pain, palpitations, orthopnea, leg swelling and PND.  Gastrointestinal: Negative.  Negative for abdominal pain, blood in stool, constipation, diarrhea, melena, nausea and vomiting.  Genitourinary: Negative.  Negative for dysuria, frequency, hematuria and urgency.  Musculoskeletal: Positive for joint pain (chronic). Negative for back pain, falls, myalgias and neck pain.  Skin: Negative.  Negative for itching and rash.  Neurological: Negative.  Negative for dizziness, tremors, sensory change, speech change, focal weakness, weakness and headaches.  Endo/Heme/Allergies: Negative.  Does not bruise/bleed easily.  Psychiatric/Behavioral: Negative.  Negative for depression and memory loss. The patient is not nervous/anxious and does not have insomnia.   All other systems reviewed and are negative.   Performance status (ECOG):  0  Physical Exam  Constitutional: He is oriented to person, place, and time. He appears well-developed and well-nourished. No distress.  HENT:  Head: Normocephalic and atraumatic.  Near alopecia. Short gray/white hair and full white beard.  Eyes: Conjunctivae and EOM are normal. No scleral icterus.  Hazel eyes.  Neurological: He is alert and oriented to person,  place, and time.  Skin: He is not diaphoretic.  Psychiatric: He has a normal mood and affect. His behavior is normal. Judgment and thought content normal.  Nursing note reviewed.   Hospital Outpatient Visit on 04/02/2019  Component Date Value Ref Range Status  . Glucose-Capillary 04/02/2019 93  70 - 99 mg/dL Final    Assessment:  Jared Gutierrez is a 77 y.o. male with stage I colon cancers/p transverse colectomy on 10/17/2013. 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 Tumor extended into the submucosa. Margins were negative. There was lymphovascular invasion. 14 lymph nodes were negative.Pathologic stagewas T1 N0.  Colonoscopyon 10/02/2014 noted several 3 mm polyps and an 8 mm polyp in the cecum and transverse colon. Pathology revealed tubular adenomas negative for high-grade dysplasia and malignancy. Colonoscopyon 02/16/2017 revealed 2 diminutive polyps in the descending colon. Pathology revealed tubular adenomas without dysplasia or malignancy.  CEAhas been followed: 3.1 on 04/27/2014, 2.3 on 11/11/2014, 3.0 on 05/12/2015, 2.7 on 11/10/2015, 3.2 on 05/10/2016, 2.8 on 11/01/2016, 3.3 on 04/25/2017, 3.1 on 10/31/2017, 4.0 on 05/01/2018, and 7.0 on 11/28/2018, 7.4 on 12/13/2018, and 10.5  on 03/25/2019.  Chest, abdomen and pelvis CT on 12/20/2018 revealed postoperative findings of transverse colon resection without evidence of recurrent mass, definite lymphadenopathy, or distant metastatic disease to explain rising CEA. There were prominent sub-centimeter retroperitoneal and iliac lymph nodes, uncertain significance.  There were postoperative findings about the left renal hilum, of uncertain nature, with a broad-based postoperative fat containing left-sided lumbar hernia.   There was bibasilar bronchiectasis and scarring with multiple post infectious or inflammatory pneumatoceles,  unchanged in comparison to CT date 06/23/2011.  PET scan on 04/02/2019 revealed a 2.4 x 2.3 cm (SUV 11) hypermetabolic soft tissue density caudal and anterior to the pancreatic neck favored to represent isolated peritoneal or nodal metastasis in the setting of prior transverse colonic resection (expected primary drainage). Although this was immediately adjacent to the pancreas, a fat plane was maintained, arguing strongly against a pancreatic primary. Otherwise, there was no evidence of hypermetabolic metastasis.  Symptomatically, he denies any complaint.  Plan: 1.   Review interim labs. 2.   Stage I colon cancer             Clinically, he continues to do well.               CEA was 7.0 on 11/28/2018, 7.4 on 12/13/2018, and 10.5 on 03/25/2019.             Chest abdomen and pelvic CT on 12/20/2018.revealed prominent subcentimeter retroperitoneal and iliac lymph nodes.  PET scan on 04/02/2019 revealed a 2.4 x 2.3 cm (SUV 11) soft tissue density caudal and anterior to the pancreatic neck.   Etiology is felt to represent isolated peritoneal or nodal metastasis.   Images personally reviewed with patient.     Copy of report sent to patient.   Images reviewed with radiology.  Discuss obtaining a biopsy.   MD to contact interventional radiology at [facility] and interventional GI at [facility].   Patient agreeable to a biopsy.  3.   Right lower extremity edema              Right lower extremity duplex at last visit revealed no DVT, but a Baker's cyst. 4.   RTC after biopsy for MD assessment, review of biopsy, and discussion regarding direction of therapy.  Addendum:  I spoke with interventional radiology.  Biopsy unable to be performed.  I spoke with Mariea Clonts, RN.  EUS to be performed by Dr Mont Dutton at [place] on 04/17/2019.  I discussed the assessment and treatment plan with the patient.  The patient was provided an opportunity to ask questions and all were answered.  The patient agreed with the plan  and demonstrated an understanding of the instructions.  The patient was advised to call back or seek an in person evaluation if the symptoms worsen or if the condition fails to improve as anticipated.  I provided 12 minutes (11:48 AM - 11:59 AM) of face-to-face video visit time during this this encounter and > 50% was spent counseling as documented under my assessment and plan.  I provided these services from the [facility] office.   Nolon Stalls, MD, PhD  04/03/2019, 11:48 AM  I, Edman Circle, am acting as Education administrator for [employer]. Mike Gip, MD, PhD.  I, Melissa C. Mike Gip, MD, have reviewed the above documentation for accuracy and completeness, and I agree with the above.

## 2019-04-02 ENCOUNTER — Encounter: Payer: Self-pay | Admitting: Hematology and Oncology

## 2019-04-02 ENCOUNTER — Ambulatory Visit
Admission: RE | Admit: 2019-04-02 | Discharge: 2019-04-02 | Disposition: A | Discharge status: Home / Self Care | Payer: Medicare Other | Source: Ambulatory Visit | Attending: Hematology and Oncology | Admitting: Hematology and Oncology

## 2019-04-02 ENCOUNTER — Other Ambulatory Visit: Payer: Self-pay

## 2019-04-02 DIAGNOSIS — Z85038 Personal history of other malignant neoplasm of large intestine: Secondary | ICD-10-CM | POA: Diagnosis not present

## 2019-04-02 DIAGNOSIS — R970 Elevated carcinoembryonic antigen [CEA]: Secondary | ICD-10-CM | POA: Diagnosis not present

## 2019-04-02 LAB — GLUCOSE, CAPILLARY: Glucose-Capillary: 93 mg/dL (ref 70–99)

## 2019-04-02 MED ORDER — FLUDEOXYGLUCOSE F - 18 (FDG) INJECTION
14.5000 | Freq: Once | INTRAVENOUS | Status: AC | PRN
  Administered 2019-04-02: 15.53 via INTRAVENOUS

## 2019-04-02 NOTE — Progress Notes (Signed)
The patient Name and DOB has been verified by phone today. No new changes noted today.

## 2019-04-03 ENCOUNTER — Inpatient Hospital Stay (HOSPITAL_BASED_OUTPATIENT_CLINIC_OR_DEPARTMENT_OTHER): Payer: Medicare Other | Admitting: Hematology and Oncology

## 2019-04-03 DIAGNOSIS — R600 Localized edema: Secondary | ICD-10-CM

## 2019-04-03 DIAGNOSIS — Z85038 Personal history of other malignant neoplasm of large intestine: Secondary | ICD-10-CM | POA: Diagnosis not present

## 2019-04-03 DIAGNOSIS — R970 Elevated carcinoembryonic antigen [CEA]: Secondary | ICD-10-CM

## 2019-04-04 ENCOUNTER — Other Ambulatory Visit: Payer: Self-pay

## 2019-04-04 ENCOUNTER — Telehealth: Payer: Self-pay

## 2019-04-04 NOTE — Telephone Encounter (Signed)
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 History of stage I colon cancer. Dr. Mike Gip would like this biopsied. EUS scheduled for 04/17/19 with Dr. Mont Dutton at [facility]. Called and spoke with Mrs. Vanboxtel. Educated further on EUS. Went over all instructions for EUS and covid-19 testing. A copy of these instructions were also mailed to home address along with my contact information for future questions or needs. Denies diabetes. Only anticoagulant is aspirin 81mg .   INSTRUCTIONS FOR ENDOSCOPIC ULTRASOUND -Your procedure has been scheduled for December 3rd with Dr. [name] at [facility]. -The hospital may contact you to pre-register over the phone.  -To get your scheduled arrival time, please call the Endoscopy unit at  [phone] between 1-3 p.m. on:  December 2nd     -ON THE DAY OF YOU PROCEDURE:   1. If you are scheduled for a morning procedure, nothing to drink after midnight  -If you are scheduled for an afternoon procedure, you may have clear liquids until 5 hours prior  to the procedure but no carbonated drinks or broth  2. NO FOOD THE DAY OF YOUR PROCEDURE  3. You may take your heart, seizure, blood pressure, Parkinson's or breathing medications at  6am with just enough water to get your pills down  4. Do not take any oral Diabetic medications the morning of your procedure.  5. If you are a diabetic and are using insulin, please notify your prescribing physician of this  procedure, as your dose may need to be altered related to not being able to eat or drink.   6. Do not take vitamins, iron, or fish oil for 5 days before your procedure     -On the day of your procedure, come to the [facility] Admitting/Registration desk (First desk on the right) at the scheduled arrival time. You MUST have someone drive you home from your procedure. You must have a responsible adult with a valid driver's license who is on site throughout your  entire procedure and who can stay with you for several hours after your procedure. You may not go home alone in a taxi, shuttle [place] or bus, as the drivers will not be responsible for you.  --If you have any questions please call me at the above contact   In Preparation of your upcoming procedure you are having testing for Covid-19 on Monday, November 30th, at the [facility]. This is a drive-thru testing site. Please come between 10:30 - 12:30.  We are asking that you stay at home and avoid visitors from this point until after your procedure is completed to minimize potential for Covid-19 exposure.    Please Wash your hands frequently with soap and water or clean hands with an alcohol-based hand sanitizer often.  Do not touch your eyes, nose, and mouth, especially with unwashed hands.  Should you at any time develop new symptoms of fever, cough, sneezing or runny nose, sore throat, difficulty breathing, or unexplained body aches, we ask that you contact your provider.   It may take up to 48 hours for results of this testing to be made available.   You will not receive notification if test results are negative.  If test results are positive for Covid-19, your provider or his/her representative will notify you by phone, with additional instructions.

## 2019-04-14 ENCOUNTER — Other Ambulatory Visit: Payer: Self-pay

## 2019-04-14 ENCOUNTER — Other Ambulatory Visit
Admission: RE | Admit: 2019-04-14 | Discharge: 2019-04-14 | Disposition: A | Discharge status: Home / Self Care | Payer: Medicare Other | Source: Ambulatory Visit | Attending: Internal Medicine | Admitting: Internal Medicine

## 2019-04-14 DIAGNOSIS — Z20828 Contact with and (suspected) exposure to other viral communicable diseases: Secondary | ICD-10-CM | POA: Insufficient documentation

## 2019-04-14 DIAGNOSIS — Z01812 Encounter for preprocedural laboratory examination: Secondary | ICD-10-CM | POA: Insufficient documentation

## 2019-04-14 LAB — SARS CORONAVIRUS 2 (TAT 6-24 HRS): SARS Coronavirus 2: NEGATIVE

## 2019-04-17 ENCOUNTER — Ambulatory Visit: Payer: Medicare Other | Admitting: Certified Registered"

## 2019-04-17 ENCOUNTER — Encounter
Admission: RE | Disposition: A | Discharge status: Home / Self Care | Payer: Self-pay | Source: Ambulatory Visit | Attending: Internal Medicine

## 2019-04-17 ENCOUNTER — Encounter: Payer: Self-pay | Admitting: *Deleted

## 2019-04-17 ENCOUNTER — Ambulatory Visit
Admission: RE | Admit: 2019-04-17 | Discharge: 2019-04-17 | Disposition: A | Discharge status: Home / Self Care | Payer: Medicare Other | Source: Ambulatory Visit | Attending: Internal Medicine | Admitting: Internal Medicine

## 2019-04-17 DIAGNOSIS — Z6839 Body mass index (BMI) 39.0-39.9, adult: Secondary | ICD-10-CM | POA: Diagnosis not present

## 2019-04-17 DIAGNOSIS — Z85038 Personal history of other malignant neoplasm of large intestine: Secondary | ICD-10-CM | POA: Diagnosis not present

## 2019-04-17 DIAGNOSIS — C257 Malignant neoplasm of other parts of pancreas: Secondary | ICD-10-CM | POA: Diagnosis not present

## 2019-04-17 DIAGNOSIS — Z7982 Long term (current) use of aspirin: Secondary | ICD-10-CM | POA: Insufficient documentation

## 2019-04-17 DIAGNOSIS — I10 Essential (primary) hypertension: Secondary | ICD-10-CM | POA: Diagnosis not present

## 2019-04-17 DIAGNOSIS — M1990 Unspecified osteoarthritis, unspecified site: Secondary | ICD-10-CM | POA: Insufficient documentation

## 2019-04-17 DIAGNOSIS — R933 Abnormal findings on diagnostic imaging of other parts of digestive tract: Secondary | ICD-10-CM | POA: Diagnosis present

## 2019-04-17 DIAGNOSIS — Z791 Long term (current) use of non-steroidal anti-inflammatories (NSAID): Secondary | ICD-10-CM | POA: Diagnosis not present

## 2019-04-17 HISTORY — PX: EUS: SHX5427

## 2019-04-17 HISTORY — DX: Personal history of urinary calculi: Z87.442

## 2019-04-17 SURGERY — ULTRASOUND, UPPER GI TRACT, ENDOSCOPIC
Anesthesia: General

## 2019-04-17 MED ORDER — SODIUM CHLORIDE 0.9 % IV SOLN
INTRAVENOUS | Status: DC
  Administered 2019-04-17: 1000 mL via INTRAVENOUS

## 2019-04-17 MED ORDER — GLYCOPYRROLATE 0.2 MG/ML IJ SOLN
INTRAMUSCULAR | Status: DC | PRN
  Administered 2019-04-17: 0.2 mg via INTRAVENOUS

## 2019-04-17 MED ORDER — FENTANYL CITRATE (PF) 100 MCG/2ML IJ SOLN
INTRAMUSCULAR | Status: DC | PRN
  Administered 2019-04-17: 50 ug via INTRAVENOUS

## 2019-04-17 MED ORDER — LIDOCAINE 2% (20 MG/ML) 5 ML SYRINGE
INTRAMUSCULAR | Status: DC | PRN
  Administered 2019-04-17: 25 mg via INTRAVENOUS

## 2019-04-17 MED ORDER — PROPOFOL 500 MG/50ML IV EMUL
INTRAVENOUS | Status: DC | PRN
  Administered 2019-04-17: 120 ug/kg/min via INTRAVENOUS

## 2019-04-17 MED ORDER — PROPOFOL 10 MG/ML IV BOLUS
INTRAVENOUS | Status: DC | PRN
  Administered 2019-04-17: 70 mg via INTRAVENOUS
  Administered 2019-04-17: 30 mg via INTRAVENOUS

## 2019-04-17 MED ORDER — EPHEDRINE SULFATE 50 MG/ML IJ SOLN
INTRAMUSCULAR | Status: DC | PRN
  Administered 2019-04-17: 10 mg via INTRAVENOUS

## 2019-04-17 NOTE — Anesthesia Preprocedure Evaluation (Addendum)
Anesthesia Evaluation  Patient identified by MRN, date of birth, ID band Patient awake    Reviewed: Allergy & Precautions, H&P , NPO status , Patient's Chart, lab work & pertinent test results  Airway Mallampati: II  TM Distance: >3 FB     Dental  (+) Missing   Pulmonary neg pulmonary ROS, neg shortness of breath, neg COPD,           Cardiovascular hypertension, (-) angina(-) Past MI and (-) Cardiac Stents (-) dysrhythmias      Neuro/Psych negative neurological ROS  negative psych ROS   GI/Hepatic Neg liver ROS, H/o colon CA   Endo/Other  Morbid obesity  Renal/GU Renal stones  negative genitourinary   Musculoskeletal   Abdominal   Peds  Hematology negative hematology ROS (+)   Anesthesia Other Findings Past Medical History: No date: Arthritis No date: Cancer ([place]) No date: Cavitary lesion of lung No date: Chicken pox No date: Colon cancer ([place]) No date: History of kidney stones No date: Hypertension No date: Lipoma of colon No date: Nephrolithiasis No date: Nephrolithiasis No date: Obesity No date: Shingles No date: Tubular adenoma of colon     Comment:  multiple fragments  Past Surgical History: No date: COLON SURGERY 10/02/2014: COLONOSCOPY; N/A     Comment:  Procedure: COLONOSCOPY;  Surgeon: Josefine Class,               MD;  Location: [facility] ENDOSCOPY;  Service: Endoscopy;                Laterality: N/A; 02/16/2017: COLONOSCOPY WITH PROPOFOL; N/A     Comment:  Procedure: COLONOSCOPY WITH PROPOFOL;  Surgeon: Manya Silvas, MD;  Location: [facility] ENDOSCOPY;  Service:               Endoscopy;  Laterality: N/A;  BMI    Body Mass Index: 39.87 kg/m      Reproductive/Obstetrics negative OB ROS                            Anesthesia Physical Anesthesia Plan  ASA: III  Anesthesia Plan: General   Post-op Pain Management:    Induction:   PONV Risk Score  and Plan: Propofol infusion and TIVA  Airway Management Planned: Natural Airway and Nasal Cannula  Additional Equipment:   Intra-op Plan:   Post-operative Plan:   Informed Consent: I have reviewed the patients History and Physical, chart, labs and discussed the procedure including the risks, benefits and alternatives for the proposed anesthesia with the patient or authorized representative who has indicated his/her understanding and acceptance.     Dental Advisory Given  Plan Discussed with: Anesthesiologist  Anesthesia Plan Comments:        Anesthesia Quick Evaluation

## 2019-04-17 NOTE — Transfer of Care (Signed)
Immediate Anesthesia Transfer of Care Note  Patient: Jared Gutierrez  Procedure(s) Performed: FULL UPPER ENDOSCOPIC ULTRASOUND (EUS) RADIAL (N/A )  Patient Location: Endoscopy Unit  Anesthesia Type:General  Level of Consciousness: sedated  Airway & Oxygen Therapy: Patient Spontanous Breathing and Patient connected to face mask oxygen  Post-op Assessment: Report given to RN  Post vital signs: Reviewed  Last Vitals:  Vitals Value Taken Time  BP 98/51 04/17/19 0931  Temp    Pulse 77 04/17/19 0933  Resp 17 04/17/19 0933  SpO2 96 % 04/17/19 0933  Vitals shown include unvalidated device data.  Last Pain:  Vitals:   04/17/19 0832  TempSrc: Temporal  PainSc: 0-No pain         Complications: No apparent anesthesia complications

## 2019-04-17 NOTE — Anesthesia Post-op Follow-up Note (Signed)
Anesthesia QCDR form completed.        

## 2019-04-17 NOTE — Discharge Instructions (Signed)
Discharge to home °

## 2019-04-17 NOTE — Interval H&P Note (Signed)
History and Physical Interval Note:  04/17/2019 8:37 AM  Jared Gutierrez  has presented today for surgery, with the diagnosis of soft tissue density lesion at pancreatic neck. History of stage I colon cancer.  The various methods of treatment have been discussed with the patient and family. After consideration of risks, benefits and other options for treatment, the patient has consented to  Procedure(s): FULL UPPER ENDOSCOPIC ULTRASOUND (EUS) RADIAL (N/A) as a surgical intervention.  The patient's history has been reviewed, patient examined, no change in status, stable for surgery.  I have reviewed the patient's chart and labs.  Questions were answered to the patient's satisfaction.     [name]

## 2019-04-17 NOTE — Op Note (Signed)
[facility] Patient Name: Jared Gutierrez Procedure Date: 04/17/2019 8:41 AM MRN: [id number] Account #: [id number] Date of Birth: July 03, 1940 Admit Type: Outpatient Age: 78 Room: [facility] ENDO ROOM 3 Gender: Male Note Status: Finalized Procedure:             Upper EUS Indications:           Abnormal abdominal PET scan: FDG avid mass adjacent to                         the pancreatic neck, History of colon cancer with                         concern for metastatic colon cancer Patient Profile:       Refer to note in patient chart for documentation of                         history and physical. Providers:             Murray Hodgkins. Aniela Caniglia Referring MD:          Tracie Harrier, MD (Referring MD), Melissa C.                         Corcoran (Referring MD) Medicines:             Propofol per Anesthesia Complications:         No immediate complications. Procedure:             Pre-Anesthesia Assessment:                        Prior to the procedure, a History and Physical was                         performed, and patient medications and allergies were                         reviewed. The patient is competent. The risks and                         benefits of the procedure and the sedation options and                         risks were discussed with the patient. All questions                         were answered and informed consent was obtained.                         Patient identification and proposed procedure were                         verified by the physician, the nurse and the                         anesthetist in the pre-procedure area. Mental Status                         Examination: alert and oriented. Airway Examination:  normal oropharyngeal airway and neck mobility.                         Respiratory Examination: clear to auscultation. CV                         Examination: normal. Prophylactic Antibiotics: The                          patient does not require prophylactic antibiotics.                         Prior Anticoagulants: The patient has taken no                         previous anticoagulant or antiplatelet agents. ASA                         Grade Assessment: III - A patient with severe systemic                         disease. After reviewing the risks and benefits, the                         patient was deemed in satisfactory condition to                         undergo the procedure. The anesthesia plan was to use                         monitored anesthesia care (MAC). Immediately prior to                         administration of medications, the patient was                         re-assessed for adequacy to receive sedatives. The                         heart rate, respiratory rate, oxygen saturations,                         blood pressure, adequacy of pulmonary ventilation, and                         response to care were monitored throughout the                         procedure. The physical status of the patient was                         re-assessed after the procedure.                        After obtaining informed consent, the endoscope was                         passed under direct vision. Throughout the procedure,  the patient's blood pressure, pulse, and oxygen                         saturations were monitored continuously. The Endoscope                         was introduced through the mouth, and advanced to the                         second part of duodenum. The was introduced through                         the mouth, and advanced to the stomach for ultrasound                         examination from the esophagus and stomach. The upper                         EUS was accomplished without difficulty. The patient                         tolerated the procedure well. Findings:      ENDOSCOPIC FINDING: :      The examined esophagus was  endoscopically normal.      The entire examined stomach was endoscopically normal.      The examined duodenum was endoscopically normal.      ENDOSONOGRAPHIC FINDING: :      An irregular mass was identified endosonographically in the       retroperitoneum adjacent to, but distinct from the pancreatic neck. The       mass was hypoechoic. The mass measured 24 mm by 24 mm in maximal       cross-sectional diameter. The outer margins were irregular. Fine needle       biopsy was performed. Color Doppler imaging was utilized prior to needle       puncture to confirm a lack of significant vascular structures within the       needle path. Three passes were made with the 25 gauge Medtronic       SharkCore biopsy needle using a transgastric approach. The cellularity       of the specimen was adequate. Final cytology results are pending.      There was no sign of significant endosonographic abnormality in the neck       of the pancreas, pancreatic body and pancreatic tail. The pancreatic       duct measured up to 1.7 mm in diameter.      Endosonographic imaging in the left lobe of the liver showed no       abnormalities.      The celiac region was visualized and showed no sign of significant       endosonographic abnormality. Impression:            EGD Impressions:                        - Normal esophagus.                        - Normal stomach.                        -  Normal examined duodenum.                        EUS Impressions:                        - A mass was identified endosonographically in the                         retroperitoneum adjacent to, but not involving the                         pancreatic neck. Cytology results are pending.                         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                         Fine needle biopsy performed.                        - There was no sign  of significant pathology in the                         neck of the pancreas, pancreatic body and pancreatic                         tail.                        - Normal visualized portions of the liver.                        - Normal celiac region. Recommendation:        - Discharge patient to home (ambulatory).                        - Await cytology results.                        - The findings and recommendations were discussed with                         the patient.                        - Return to referring medical oncologist, Dr.                         Mike Gip, as previously scheduled. Further plan of                         care to be determined by Dr. Mike Gip. Procedure Code(s):     --- Professional ---                        [id number], Esophagogastroduodenoscopy, flexible,  transoral; with transendoscopic ultrasound-guided                         intramural or transmural fine needle                         aspiration/biopsy(s), (includes endoscopic ultrasound                         examination limited to the esophagus, stomach or                         duodenum, and adjacent structures) Diagnosis Code(s):     --- Professional ---                        R93.5, Abnormal findings on diagnostic imaging of                         other abdominal regions, including retroperitoneum                        K66.8, Other specified disorders of peritoneum CPT copyright 2019 [employer]. All rights reserved. The codes documented in this report are preliminary and upon coder review may  be revised to meet current compliance requirements. Attending Participation:      I personally performed the entire procedure without the assistance of a       fellow, resident or surgical assistant. [place],  04/17/2019 9:30:21 AM This report has been signed electronically. Number of Addenda: 0 Note Initiated On: 04/17/2019 8:41 AM Estimated  Blood Loss:  Estimated blood loss: none.      [facility]

## 2019-04-17 NOTE — Brief Op Note (Signed)
FNP of peripancreatic mass sent to [facility].

## 2019-04-18 ENCOUNTER — Encounter: Payer: Self-pay | Admitting: Internal Medicine

## 2019-04-18 NOTE — Anesthesia Postprocedure Evaluation (Signed)
Anesthesia Post Note  Patient: Jared Gutierrez  Procedure(s) Performed: FULL UPPER ENDOSCOPIC ULTRASOUND (EUS) RADIAL (N/A )  Patient location during evaluation: PACU Anesthesia Type: General Level of consciousness: awake and alert Pain management: pain level controlled Vital Signs Assessment: post-procedure vital signs reviewed and stable Respiratory status: spontaneous breathing, nonlabored ventilation and respiratory function stable Cardiovascular status: blood pressure returned to baseline and stable Postop Assessment: no apparent nausea or vomiting Anesthetic complications: no     Last Vitals:  Vitals:   04/17/19 0949 04/17/19 0950  BP:  110/74  Pulse: 78 70  Resp: 11 11  Temp:    SpO2: 95% 95%    Last Pain:  Vitals:   04/18/19 0747  TempSrc:   PainSc: 0-No pain                 Durenda Hurt

## 2019-04-21 ENCOUNTER — Other Ambulatory Visit: Payer: Self-pay | Admitting: Hematology and Oncology

## 2019-04-21 LAB — CYTOLOGY - NON PAP

## 2019-04-24 ENCOUNTER — Other Ambulatory Visit: Payer: Medicare Other

## 2019-04-24 ENCOUNTER — Encounter: Payer: Self-pay | Admitting: Hematology and Oncology

## 2019-04-24 NOTE — Progress Notes (Signed)
[facility]  [address] [place], [place] [address] Phone: [phone]  Fax: [phone]   Telemedicine Office Visit:  04/25/2019  Referring physician: Tracie Harrier, MD  I connected with Jared Gutierrez on 04/25/19 at 2:09 PM by videoconferencing and verified that I was speaking with the correct person using 2 identifiers.  The patient was at home.  I discussed the limitations, risk, security and privacy concerns of performing an evaluation and management service by videoconferencing and the availability of in person appointments.  I also discussed with the patient that there may be a patient responsible charge related to this service.  The patient expressed understanding and agreed to proceed.   Chief Complaint: Jared Gutierrez is a 78 y.o. male with  stage I colon cancer who is seen for MD assessment after biopsy and discussion regarding direction of therapy.    HPI:  The patient was last encountered in the medical oncology clinic via telemedicine on 04/03/2019. At that time, he denied any complaints.  PET scan on 04/02/2019 revealed a 2.4 x 2.3 cm (SUV 11) soft tissue density caudal and anterior to the pancreatic neck.  Etiology was felt to represent isolated peritoneal or nodal metastasis.  We discussed obtaining a biopsy.  Upper endoscopic ultrasound on 04/17/2019 by Dr. [name] revealed a normal esophagus, stomach, duodenum, and pancreas.  There was a 2.4 x 2.4 cm irregular mass in the retroperitoneum adjacent to, but not involving the pancreatic neck. FNA and core needle biopsy were performed.  Pathology revealed adenocarcinoma compatible with a metastatic lesion of colorectal origin. Tumor cells were positive for CK20 and CDX2 and negative for CK7.  He states that his biopsy went well with no complications. He has been doing "alright" since. He had some swelling in his legs, which reminded him of gout. He and his wife check for clots  in his legs.  He denies any pleuritic chest pain or shortness of breath.  Symptomatically, he is doing "ok".  He denies any abdominal pain or complications after biopsy.    Past Medical History:  Diagnosis Date  . Arthritis   . Cancer ([place])   . Cavitary lesion of lung   . Chicken pox   . Colon cancer ([place])   . History of kidney stones   . Hypertension   . Lipoma of colon   . Nephrolithiasis   . Nephrolithiasis   . Obesity   . Shingles   . Tubular adenoma of colon    multiple fragments    Past Surgical History:  Procedure Laterality Date  . COLON SURGERY    . COLONOSCOPY N/A 10/02/2014   Procedure: COLONOSCOPY;  Surgeon: Josefine Class, MD;  Location: [facility] ENDOSCOPY;  Service: Endoscopy;  Laterality: N/A;  . COLONOSCOPY WITH PROPOFOL N/A 02/16/2017   Procedure: COLONOSCOPY WITH PROPOFOL;  Surgeon: Manya Silvas, MD;  Location[facility] ENDOSCOPY;  Service: Endoscopy;  Laterality: N/A;  . EUS N/A 04/17/2019   Procedure: FULL UPPER ENDOSCOPIC ULTRASOUND (EUS) RADIAL;  Surgeon: Holly Bodily, MD;  Location: [facility] ENDOSCOPY;  Service: Gastroenterology;  Laterality: N/A;    Family History  Problem Relation Age of Onset  . Cancer Mother   . Breast cancer Mother   . COPD Father     Social History:  reports that he has never smoked. He has never used smokeless tobacco. He reports current alcohol use. He reports that he does not use drugs. 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 He is married and his wife's name is [name] [phone]). The patient is accompanied by his wife today.  Participants in the patient's visit and their role in the encounter included the patient, Samul Dada, scribe, and [employer], CMA, today.  The intake visit was provided by Samul Dada, scribe, and  Vito Berger, CMA.   Allergies: No Known Allergies  Current Medications: Current Outpatient Medications  Medication Sig Dispense Refill  . amLODipine  (NORVASC) 2.5 MG tablet Take by mouth.    [name] aspirin 81 MG tablet Take 81 mg by mouth daily.    . ferrous sulfate 325 (65 FE) MG EC tablet Take by mouth.    [name] glucosamine-chondroitin 500-400 MG tablet Take 1 tablet by mouth daily.    . meloxicam (MOBIC) 15 MG tablet Take 15 mg by mouth daily.    [name] olmesartan (BENICAR) 20 MG tablet Take 20 mg by mouth daily.    [name] oxybutynin (DITROPAN) 5 MG tablet Take 5 mg by mouth 3 (three) times daily.    . Probiotic Product (PROBIOTIC DAILY PO) Take 1 tablet by mouth daily.     No current facility-administered medications for this visit.    Review of Systems  Constitutional: Negative for chills, diaphoresis, fever, malaise/fatigue and weight loss.       Doing "ok".  HENT: Negative.  Negative for congestion, ear pain, nosebleeds, sinus pain and sore throat.   Eyes: Negative.  Negative for blurred vision, double vision, photophobia and pain.  Respiratory: Negative.  Negative for cough, hemoptysis, sputum production and shortness of breath.   Cardiovascular: Positive for leg swelling. Negative for chest pain, palpitations, orthopnea and PND.  Gastrointestinal: Negative.  Negative for abdominal pain, blood in stool, constipation, diarrhea, melena, nausea and vomiting.  Genitourinary: Negative.  Negative for dysuria, frequency, hematuria and urgency.  Musculoskeletal: Positive for joint pain (chronic). Negative for back pain, falls, myalgias and neck pain.  Skin: Negative.  Negative for itching and rash.  Neurological: Negative.  Negative for dizziness, tremors, sensory change, speech change, focal weakness, weakness and headaches.  Endo/Heme/Allergies: Negative.  Does not bruise/bleed easily.  Psychiatric/Behavioral: Negative.  Negative for depression and memory loss. The patient is not nervous/anxious and does not have insomnia.   All other systems reviewed and are negative.  Performance status (ECOG): 0  Vitals There were no vitals taken for this visit.    Physical Exam  Constitutional: He is oriented to person, place, and time. He appears well-developed and well-nourished.  HENT:  Head: Normocephalic and atraumatic.  Alopecia.  Full beard.  Eyes: Conjunctivae and EOM are normal. No scleral icterus.  Neurological: He is alert and oriented to person, place, and time.  Skin: Skin is warm and dry. No rash noted. No erythema. No pallor.  Psychiatric: He has a normal mood and affect. His behavior is normal. Judgment and thought content normal.  Nursing note reviewed.   No visits with results within 3 Day(s) from this visit.  Latest known visit with results is:  Admission on 04/17/2019, Discharged on 04/17/2019  Component Date Value Ref Range Status  . CYTOLOGY - NON GYN 04/17/2019    Final-Edited                   Value:CYTOLOGY - NON PAP THIS IS AN ADDENDUM REPORT CASE: [id number] PATIENT: Terrilyn Saver Non-Gynecological Cytology Report Addendum  Reason for Addendum #1:  Clerical  Specimen Submitted: A. Peripancreatic mass; FNA  Clinical History:  Peripancreatic mass  DIAGNOSIS: A. PERIPANCREATIC MASS; EUS-GUIDED FINE NEEDLE ASPIRATION BIOPSY: - DIAGNOSTIC OF MALIGNANCY. - ADENOCARCINOMA; SEE COMMENT.  Comment: The tumor cells are positive for CK20 and CDX2.  They are negative for CK7.  In the proper clinical context, the findings are compatible with a metastatic lesion of colorectal origin.  Clinical correlation is recommended.  GROSS DESCRIPTION: A. Site: Peripancreatic mass Procedure: Endoscopic ultrasound Cytology specimen: FNA/needle biopsy and rinse Cytotechnologist(s): Molli Barrows and Vance Peper  Specimen material collected and submitted for:  1 Diff Quik stained slides  1 Pap stained slides Specimen material su                         bmitted for: Cell block and ThinPrep  Specimen description: Fixative: CytoLyt Volume: 15 mL Color: Colorless Transparency: Clear Tissue fragments present:  Yes Collection brush(es) present: No  Final Diagnosis performed by Betsy Pries, MD.   Electronically signed 04/21/2019 12:54:42PM The electronic signature indicates that the named Attending Pathologist has evaluated the specimen Technical component performed at [facility], [address], [place], [place] [address] Lab: [phone] Dir: Rush Farmer, MD, MMM  Professional component performed at [facility], [facility], [place], [place], [place] [address] Lab: [phone] Dir: Dellia Nims. Reuel Derby, MD  ADDENDUM: IHC slides were prepared by [facility], [place], [place]. All controls stained appropriately.  This test was developed and its performance characteristics determined by [facility]. It has not been cleared or approved by the [place] Food and Drug Administrati                         on. The FDA does not require this test to go through premarket FDA review. This test is used for clinical purposes. It should not be regarded as investigational or for research. This laboratory is certified under the Clinical Laboratory Improvement Amendments (CLIA) as qualified to perform high complexity clinical laboratory testing.  Addendum #1 performed by Betsy Pries, MD.   Electronically signed 04/21/2019 1:04:53PM The electronic signature indicates that the named Attending Pathologist has evaluated the specimen Technical component performed at [facility], [address], [place], [place] [address] Lab: [phone] Dir: Rush Farmer, MD, MMM  Professional component performed at [facility], [facility], [place], [place], [place] [address] Lab: [phone] Dir: Dellia Nims. Reuel Derby, MD    Assessment:  Jared Gutierrez is a 78 y.o. male with metastatic colon cancer.  He was diagnosed with stage I colon cancers/p transverse colectomy on 10/17/2013. Pathologyrevealed a 1 cm moderately differentiated invasive adenocarcinoma arising in  a 4.6 cm tubulovillous adenoma with high-grade dysplasia. Tumor extended into the submucosa. Margins were negative. There was lymphovascular invasion. 14 lymph nodes were negative.Pathologic stagewas T1 N0.  Colonoscopyon 10/02/2014 noted several 3 mm polyps and an 8 mm polyp in the cecum and transverse colon. Pathology revealed tubular adenomas negative for high-grade dysplasia and malignancy. Colonoscopyon 02/16/2017 revealed 2 diminutive polyps in the descending colon. Pathology revealed tubular adenomas without dysplasia or malignancy.  CEAhas been followed: 3.1 on 04/27/2014, 2.3 on 11/11/2014, 3.0 on 05/12/2015, 2.7 on 11/10/2015, 3.2 on 05/10/2016, 2.8 on 11/01/2016, 3.3 on 04/25/2017, 3.1 on 10/31/2017, 4.0 on 05/01/2018, 7.0 on 11/28/2018, 7.4 on 12/13/2018, and 10.5 on 03/25/2019.  Chest, abdomen and pelvis CTon 12/20/2018 revealed postoperative findings of transverse colon resection without evidence of recurrent mass, definite lymphadenopathy, or distant metastatic disease to explain rising CEA. Therewereprominent sub-centimeter  retroperitoneal and iliac lymph nodes, uncertain significance. There were postoperative findings about the left renal hilum, of uncertain nature, with a broad-based postoperative fat containing left-sided lumbar hernia.There was bibasilar bronchiectasis and scarring with multiple post infectious or inflammatory pneumatoceles, unchanged in comparison to CT date 06/23/2011.  PET scan on 04/02/2019 revealed a 2.4 x 2.3 cm (SUV 11) hypermetabolic soft tissue density caudal and anterior to the pancreatic neck favored to represent isolated peritoneal or nodal metastasis in the setting of prior transverse colonic resection (expected primary drainage). Although this was immediately adjacent to the pancreas, a fat plane was maintained, arguing strongly against a pancreatic primary. Otherwise, there was no evidence of hypermetabolic metastasis.  Upper endoscopic  ultrasound on 04/17/2019 revealed a normal esophagus, stomach, duodenum, and pancreas.  There was a 2.4 x 2.4 cm irregular mass in the retroperitoneum adjacent to, but not involving the pancreatic neck.  FNA and core needle biopsy were performed.  Pathology revealed adenocarcinoma compatible with a metastatic lesion of colorectal origin.  Tumor cells were positive for CK20 and CDX2 and negative for CK7.  He has chronic right lower extremity edema.  Right lower extremity duplex on 12/24/2018 revealed no DVT.  He has a small right Baker's cyst.  Symptomatically, he is doing well.  He notes issues with gout.  Plan: 1.   Metastatic colon cancer  Clinically, he is doing well.  He initially presented with stage I disease s/p resection. CEA was 4.0 on 05/01/2018, 7.0 on 11/28/2018, 7.4 on07/31/2020, and 10.5 on 03/25/2019. Chest abdomen and pelvic CT on 12/20/2018.revealed prominent subcentimeter retroperitoneal and iliac lymph nodes.             PET scan on 04/02/2019 revealed a 2.4 x 2.3 cm (SUV 11) soft tissue density caudal and anterior to the pancreatic neck.             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   Lesion is not resectable.  Discuss chemotherapy   FOLFOX, CapeOx or Xeloda   Tissue sent for NGS.    Assess KRAS, NRAS, BRAF; MMR/MSI    If not enough tissue available for testing, send original tissue for testing. 2.Right lower extremity edema Right lower extremity duplex on 12/24/2018 revealed no DVT.   He has a small Baker's cyst. 3.  Await NGS on recent biopsy. 4.   RTC based on above (MD to schedule).  I discussed the assessment and treatment plan with the patient.  The patient was provided an opportunity to ask questions and all were answered.  The patient agreed with the plan and demonstrated an understanding of the instructions.  The patient was advised to call back if the symptoms worsen or if the  condition fails to improve as anticipated.  I provided 11 minutes (2:09 PM - 2:19 PM) of face-to-face time during this this encounter and > 50% was spent counseling as documented under my assessment and plan.    Lequita Asal, MD, PhD    04/25/2019, 2:19 PM  I, Samul Dada, am acting as a scribe for Lequita Asal, MD.  I, [place] Mike Gip, MD, have reviewed the above documentation for accuracy and completeness, and I agree with the above.

## 2019-04-24 NOTE — Progress Notes (Signed)
No new changes noted today. The patient Name and DOB has been verified by his wife( phone)

## 2019-04-24 NOTE — Progress Notes (Signed)
Tumor Board Documentation  Jared Gutierrez was presented by Dr Mike Gip at our Tumor Board on 04/24/2019, which included representatives from medical oncology, radiation oncology, navigation, pathology, radiology, surgical, surgical oncology, internal medicine, pharmacy, genetics, palliative care, research.  Jared Gutierrez currently presents as a current patient, for [place] with history of the following treatments: active survellience, surgical intervention(s).  Additionally, we reviewed previous medical and familial history, history of present illness, and recent lab results along with all available histopathologic and imaging studies. The tumor board considered available treatment options and made the following recommendations: Additional screening, Chemotherapy, Immunotherapy Send tissue for NGS testing, if not enough with new tissue sample, send from original biopsy  The following procedures/referrals were also placed: No orders of the defined types were placed in this encounter.   Clinical Trial Status: not discussed   Staging used: Clinical Stage  AJCC Staging:       Group: Stage 4 Colorectal Cancer  National site-specific guidelines   were discussed with respect to the case.  Tumor board is a meeting of clinicians from various specialty areas who evaluate and discuss patients for whom a multidisciplinary approach is being considered. Final determinations in the plan of care are those of the provider(s). The responsibility for follow up of recommendations given during tumor board is that of the provider.   Today's extended care, comprehensive team conference, Daud was not present for the discussion and was not examined.   Multidisciplinary Tumor Board is a multidisciplinary case peer review process.  Decisions discussed in the Multidisciplinary Tumor Board reflect the opinions of the specialists present at the conference without having examined the patient.  Ultimately, treatment and  diagnostic decisions rest with the primary provider(s) and the patient.

## 2019-04-25 ENCOUNTER — Inpatient Hospital Stay: Payer: Medicare Other | Attending: Hematology and Oncology | Admitting: Hematology and Oncology

## 2019-04-25 ENCOUNTER — Encounter: Payer: Self-pay | Admitting: Hematology and Oncology

## 2019-04-25 DIAGNOSIS — C184 Malignant neoplasm of transverse colon: Secondary | ICD-10-CM

## 2019-04-25 DIAGNOSIS — Z7982 Long term (current) use of aspirin: Secondary | ICD-10-CM

## 2019-04-25 DIAGNOSIS — C786 Secondary malignant neoplasm of retroperitoneum and peritoneum: Secondary | ICD-10-CM

## 2019-04-25 DIAGNOSIS — R600 Localized edema: Secondary | ICD-10-CM | POA: Diagnosis not present

## 2019-04-25 DIAGNOSIS — R970 Elevated carcinoembryonic antigen [CEA]: Secondary | ICD-10-CM | POA: Diagnosis not present

## 2019-04-25 DIAGNOSIS — Z79899 Other long term (current) drug therapy: Secondary | ICD-10-CM

## 2019-04-25 DIAGNOSIS — I10 Essential (primary) hypertension: Secondary | ICD-10-CM

## 2019-04-25 NOTE — Patient Instructions (Signed)
 Capecitabine tablets What is this medicine? CAPECITABINE (ka pe SITE a been) is a chemotherapy drug. It slows the growth of cancer cells. This medicine is used to treat breast cancer, and also colon or rectal cancer. This medicine may be used for other purposes; ask your health care provider or pharmacist if you have questions. COMMON BRAND NAME(S): Xeloda What should I tell my health care provider before I take this medicine? They need to know if you have any of these conditions:  bleeding disorders  dehydration  dihydropyrimidine dehydrogenase (DPD) deficiency  heart disease  infection (especially a virus infection such as chickenpox, cold sores, or herpes)  kidney disease  liver disease  low blood counts, like low white cell, platelet, or red cell counts  an unusual or allergic reaction to capecitabine, fluorouracil, other medicines, foods, dyes, or preservatives  pregnant or trying to get pregnant  breast-feeding How should I use this medicine? Take this medicine by mouth with a glass of water, within 30 minutes of the end of a meal. Do not cut, crush or chew this medicine. Follow the directions on the prescription label. Take your medicine at regular intervals. Do not take it more often than directed. Do not stop taking except on your doctor's advice. Your doctor may want you to take a combination of 150 mg and 500 mg tablets for each dose. It is very important that you know how to correctly take your dose. Taking the wrong tablets could result in an overdose (too much medication) or underdose (too little medication). Talk to your pediatrician regarding the use of this medicine in children. Special care may be needed. Overdosage: If you think you have taken too much of this medicine contact a poison control center or emergency room at once. NOTE: This medicine is only for you. Do not share this medicine with others. What if I miss a dose? If you miss a dose, do not take  the missed dose at all. Do not take double or extra doses. Instead, continue with your next scheduled dose and check with your doctor. What may interact with this medicine? This medicine may interact with the following medications:  allopurinol  leucovorin  phenytoin  warfarin This list may not describe all possible interactions. Give your health care provider a list of all the medicines, herbs, non-prescription drugs, or dietary supplements you use. Also tell them if you smoke, drink alcohol, or use illegal drugs. Some items may interact with your medicine. What should I watch for while using this medicine? Visit your doctor for checks on your progress. This drug may make you feel generally unwell. This is not uncommon, as chemotherapy can affect healthy cells as well as cancer cells. Report any side effects. Continue your course of treatment even though you feel ill unless your doctor tells you to stop. In some cases, you may be given additional medicines to help with side effects. Follow all directions for their use. Call your doctor or health care professional for advice if you get a fever, chills or sore throat, or other symptoms of a cold or flu. Do not treat yourself. This drug decreases your body's ability to fight infections. Try to avoid being around people who are sick. This medicine may increase your risk to bruise or bleed. Call your doctor or health care professional if you notice any unusual bleeding. Be careful brushing and flossing your teeth or using a toothpick because you may get an infection or bleed more easily. If   have any dental work done, tell your dentist you are receiving this medicine. Avoid taking products that contain aspirin, acetaminophen, ibuprofen, naproxen, or ketoprofen unless instructed by your doctor. These medicines may hide a fever. Do not become pregnant while taking this medicine or for 6 months after stopping it. Women should inform their doctor if they  wish to become pregnant or think they might be pregnant. There is a potential for serious side effects to an unborn child. Talk to your health care professional or pharmacist for more information. Do not breast-feed an infant while taking this medicine or for 2 weeks after stopping it. Men are advised not to father a child while taking this medicine or for 3 months after stopping it. This medicine may make it more difficult to get pregnant or father a child. Talk with your doctor or health care professional if you are concerned about your fertility. What side effects may I notice from receiving this medicine? Side effects that you should report to your doctor or health care professional as soon as possible:  allergic reactions like skin rash, itching or hives, swelling of the face, lips, or tongue  diarrhea  low blood counts - this medicine may decrease the number of white blood cells, red blood cells and platelets. You may be at increased risk for infections and bleeding  nausea, vomiting  redness, blistering, peeling or loosening of the skin, including inside the mouth (this can be added for any serious or exfoliative rash that could lead to hospitalization)  redness, swelling, or sores on hands or feet  signs and symptoms of kidney injury like trouble passing urine or change in the amount of urine  signs and symptoms of liver injury like dark yellow or brown urine; general ill feeling or flu-like symptoms; light-colored stools; loss of appetite; nausea; right upper belly pain; unusually weak or tired; yellowing of the eyes or skin  signs of decreased platelets or bleeding - bruising, pinpoint red spots on the skin, black, tarry stools, blood in the urine  signs of decreased red blood cells - unusually weak or tired, fainting spells, lightheadedness  signs of infection - fever or chills, cough, sore throat, pain or difficulty passing urine Side effects that usually do not require medical  attention (report to your doctor or health care professional if they continue or are bothersome):  changes in vision  constipation  loss of appetite  mouth sores  pain, tingling, numbness in the hands or feet This list may not describe all possible side effects. Call your doctor for medical advice about side effects. You may report side effects to FDA at 1-800-FDA-1088. Where should I keep my medicine? Keep out of the reach of children. Store at room temperature between 15 and 30 degrees C (59 and 86 degrees F). Keep container tightly closed. Throw away any unused medicine after the expiration date. NOTE: This sheet is a summary. It may not cover all possible information. If you have questions about this medicine, talk to your doctor, pharmacist, or health care provider.  2020 Elsevier/Gold Standard (2017-08-14 21:22:45)   Bevacizumab injection What is this medicine? BEVACIZUMAB (be va SIZ yoo mab) is a monoclonal antibody. It is used to treat many types of cancer. This medicine may be used for other purposes; ask your health care provider or pharmacist if you have questions. COMMON BRAND NAME(S): Avastin, MVASI, Zirabev What should I tell my health care provider before I take this medicine? They need to know if you  have any of these conditions:  diabetes  heart disease  high blood pressure  history of coughing up blood  prior anthracycline chemotherapy (e.g., doxorubicin, daunorubicin, epirubicin)  recent or ongoing radiation therapy  recent or planning to have surgery  stroke  an unusual or allergic reaction to bevacizumab, hamster proteins, mouse proteins, other medicines, foods, dyes, or preservatives  pregnant or trying to get pregnant  breast-feeding How should I use this medicine? This medicine is for infusion into a vein. It is given by a health care professional in a hospital or clinic setting. Talk to your pediatrician regarding the use of this medicine in  children. Special care may be needed. Overdosage: If you think you have taken too much of this medicine contact a poison control center or emergency room at once. NOTE: This medicine is only for you. Do not share this medicine with others. What if I miss a dose? It is important not to miss your dose. Call your doctor or health care professional if you are unable to keep an appointment. What may interact with this medicine? Interactions are not expected. This list may not describe all possible interactions. Give your health care provider a list of all the medicines, herbs, non-prescription drugs, or dietary supplements you use. Also tell them if you smoke, drink alcohol, or use illegal drugs. Some items may interact with your medicine. What should I watch for while using this medicine? Your condition will be monitored carefully while you are receiving this medicine. You will need important blood work and urine testing done while you are taking this medicine. This medicine may increase your risk to bruise or bleed. Call your doctor or health care professional if you notice any unusual bleeding. This medicine should be started at least 28 days following major surgery and the site of the surgery should be totally healed. Check with your doctor before scheduling dental work or surgery while you are receiving this treatment. Talk to your doctor if you have recently had surgery or if you have a wound that has not healed. Do not become pregnant while taking this medicine or for 6 months after stopping it. Women should inform their doctor if they wish to become pregnant or think they might be pregnant. There is a potential for serious side effects to an unborn child. Talk to your health care professional or pharmacist for more information. Do not breast-feed an infant while taking this medicine and for 6 months after the last dose. This medicine has caused ovarian failure in some women. This medicine may  interfere with the ability to have a child. You should talk to your doctor or health care professional if you are concerned about your fertility. What side effects may I notice from receiving this medicine? Side effects that you should report to your doctor or health care professional as soon as possible:  allergic reactions like skin rash, itching or hives, swelling of the face, lips, or tongue  chest pain or chest tightness  chills  coughing up blood  high fever  seizures  severe constipation  signs and symptoms of bleeding such as bloody or black, tarry stools; red or dark-brown urine; spitting up blood or brown material that looks like coffee grounds; red spots on the skin; unusual bruising or bleeding from the eye, gums, or nose  signs and symptoms of a blood clot such as breathing problems; chest pain; severe, sudden headache; pain, swelling, warmth in the leg  signs and symptoms of a  stroke like changes in vision; confusion; trouble speaking or understanding; severe headaches; sudden numbness or weakness of the face, arm or leg; trouble walking; dizziness; loss of balance or coordination  stomach pain  sweating  swelling of legs or ankles  vomiting  weight gain Side effects that usually do not require medical attention (report to your doctor or health care professional if they continue or are bothersome):  back pain  changes in taste  decreased appetite  dry skin  nausea  tiredness This list may not describe all possible side effects. Call your doctor for medical advice about side effects. You may report side effects to FDA at 1-800-FDA-1088. Where should I keep my medicine? This drug is given in a hospital or clinic and will not be stored at home. NOTE: This sheet is a summary. It may not cover all possible information. If you have questions about this medicine, talk to your doctor, pharmacist, or health care provider.  2020 Elsevier/Gold Standard  (2016-04-28 14:33:29)    Panitumumab Solution for Injection What is this medicine? PANITUMUMAB (pan i TOOM ue mab) is a monoclonal antibody. It is used to treat colorectal cancer. This medicine may be used for other purposes; ask your health care provider or pharmacist if you have questions. COMMON BRAND NAME(S): Vectibix What should I tell my health care provider before I take this medicine? They need to know if you have any of these conditions:  eye disease, vision problems  low levels of calcium, magnesium, or potassium in the blood  lung or breathing disease, like asthma  skin conditions or sensitivity  an unusual or allergic reaction to panitumumab, other medicines, foods, dyes, or preservatives  pregnant or trying to get pregnant  breast-feeding How should I use this medicine? This drug is given as an infusion into a vein. It is administered in a hospital or clinic by a specially trained health care professional. Talk to your pediatrician regarding the use of this medicine in children. Special care may be needed. Overdosage: If you think you have taken too much of this medicine contact a poison control center or emergency room at once. NOTE: This medicine is only for you. Do not share this medicine with others. What if I miss a dose? It is important not to miss your dose. Call your doctor or health care professional if you are unable to keep an appointment. What may interact with this medicine? Do not take this medicine with any of the following medications:  bevacizumab This list may not describe all possible interactions. Give your health care provider a list of all the medicines, herbs, non-prescription drugs, or dietary supplements you use. Also tell them if you smoke, drink alcohol, or use illegal drugs. Some items may interact with your medicine. What should I watch for while using this medicine? Visit your doctor for checks on your progress. This drug may make you  feel generally unwell. This is not uncommon, as chemotherapy can affect healthy cells as well as cancer cells. Report any side effects. Continue your course of treatment even though you feel ill unless your doctor tells you to stop. This medicine can make you more sensitive to the sun. Keep out of the sun while receiving this medicine and for 2 months after the last dose. If you cannot avoid being in the sun, wear protective clothing and use sunscreen. Do not use sun lamps or tanning beds/booths. In some cases, you may be given additional medicines to help with side effects.  Follow all directions for their use. Call your doctor or health care professional for advice if you get a fever, chills or sore throat, or other symptoms of a cold or flu. Do not treat yourself. This drug decreases your body's ability to fight infections. Try to avoid being around people who are sick. Avoid taking products that contain aspirin, acetaminophen, ibuprofen, naproxen, or ketoprofen unless instructed by your doctor. These medicines may hide a fever. Do not become pregnant while taking this medicine and for 2 months after the last dose. Women should inform their doctor if they wish to become pregnant or think they might be pregnant. There is a potential for serious side effects to an unborn child. Talk to your health care professional or pharmacist for more information. Do not breast-feed an infant while taking this medicine or for 2 months after the last dose. What side effects may I notice from receiving this medicine? Side effects that you should report to your doctor or health care professional as soon as possible:  allergic reactions like skin rash, itching or hives, swelling of the face, lips, or tongue  breathing problems  changes in vision  eye pain  fast, irregular heartbeat  fever, chills  mouth sores  red spots on the skin  redness, blistering, peeling or loosening of the skin, including inside the  mouth  signs and symptoms of kidney injury like trouble passing urine or change in the amount of urine  signs and symptoms of low blood pressure like dizziness; feeling faint or lightheaded, falls; unusually weak or tired  signs of low calcium like fast heartbeat, muscle cramps or muscle pain; pain, tingling, numbness in the hands or feet; seizures  signs and symptoms of low magnesium like muscle cramps, pain, or weakness; tremors; seizures; or fast, irregular heartbeat  signs and symptoms of low potassium like muscle cramps or muscle pain; chest pain; dizziness; feeling faint or lightheaded, falls; palpitations; breathing problems; or fast, irregular heartbeat  swelling of the ankles, feet, hands Side effects that usually do not require medical attention (report to your doctor or health care professional if they continue or are bothersome):  changes in skin like acne, cracks, skin dryness  diarrhea  eyelash growth  headache  mouth sores  nail changes  nausea, vomiting This list may not describe all possible side effects. Call your doctor for medical advice about side effects. You may report side effects to FDA at 1-800-FDA-1088. Where should I keep my medicine? This drug is given in a hospital or clinic and will not be stored at home. NOTE: This sheet is a summary. It may not cover all possible information. If you have questions about this medicine, talk to your doctor, pharmacist, or health care provider.  2020 Elsevier/Gold Standard (2015-11-19 16:45:04)   Fluorouracil, 5-FU injection What is this medicine? FLUOROURACIL, 5-FU (flure oh YOOR a sil) is a chemotherapy drug. It slows the growth of cancer cells. This medicine is used to treat many types of cancer like breast cancer, colon or rectal cancer, pancreatic cancer, and stomach cancer. This medicine may be used for other purposes; ask your health care provider or pharmacist if you have questions. COMMON BRAND NAME(S):  Adrucil What should I tell my health care provider before I take this medicine? They need to know if you have any of these conditions:  blood disorders  dihydropyrimidine dehydrogenase (DPD) deficiency  infection (especially a virus infection such as chickenpox, cold sores, or herpes)  kidney disease  liver  disease  malnourished, poor nutrition  recent or ongoing radiation therapy  an unusual or allergic reaction to fluorouracil, other chemotherapy, other medicines, foods, dyes, or preservatives  pregnant or trying to get pregnant  breast-feeding How should I use this medicine? This drug is given as an infusion or injection into a vein. It is administered in a hospital or clinic by a specially trained health care professional. Talk to your pediatrician regarding the use of this medicine in children. Special care may be needed. Overdosage: If you think you have taken too much of this medicine contact a poison control center or emergency room at once. NOTE: This medicine is only for you. Do not share this medicine with others. What if I miss a dose? It is important not to miss your dose. Call your doctor or health care professional if you are unable to keep an appointment. What may interact with this medicine?  allopurinol  cimetidine  dapsone  digoxin  hydroxyurea  leucovorin  levamisole  medicines for seizures like ethotoin, fosphenytoin, phenytoin  medicines to increase blood counts like filgrastim, pegfilgrastim, sargramostim  medicines that treat or prevent blood clots like warfarin, enoxaparin, and dalteparin  methotrexate  metronidazole  pyrimethamine  some other chemotherapy drugs like busulfan, cisplatin, estramustine, vinblastine  trimethoprim  trimetrexate  vaccines Talk to your doctor or health care professional before taking any of these medicines:  acetaminophen  aspirin  ibuprofen  ketoprofen  naproxen This list may not describe  all possible interactions. Give your health care provider a list of all the medicines, herbs, non-prescription drugs, or dietary supplements you use. Also tell them if you smoke, drink alcohol, or use illegal drugs. Some items may interact with your medicine. What should I watch for while using this medicine? Visit your doctor for checks on your progress. This drug may make you feel generally unwell. This is not uncommon, as chemotherapy can affect healthy cells as well as cancer cells. Report any side effects. Continue your course of treatment even though you feel ill unless your doctor tells you to stop. In some cases, you may be given additional medicines to help with side effects. Follow all directions for their use. Call your doctor or health care professional for advice if you get a fever, chills or sore throat, or other symptoms of a cold or flu. Do not treat yourself. This drug decreases your body's ability to fight infections. Try to avoid being around people who are sick. This medicine may increase your risk to bruise or bleed. Call your doctor or health care professional if you notice any unusual bleeding. Be careful brushing and flossing your teeth or using a toothpick because you may get an infection or bleed more easily. If you have any dental work done, tell your dentist you are receiving this medicine. Avoid taking products that contain aspirin, acetaminophen, ibuprofen, naproxen, or ketoprofen unless instructed by your doctor. These medicines may hide a fever. Do not become pregnant while taking this medicine. Women should inform their doctor if they wish to become pregnant or think they might be pregnant. There is a potential for serious side effects to an unborn child. Talk to your health care professional or pharmacist for more information. Do not breast-feed an infant while taking this medicine. Men should inform their doctor if they wish to father a child. This medicine may lower sperm  counts. Do not treat diarrhea with over the counter products. Contact your doctor if you have diarrhea that lasts more  than 2 days or if it is severe and watery. This medicine can make you more sensitive to the sun. Keep out of the sun. If you cannot avoid being in the sun, wear protective clothing and use sunscreen. Do not use sun lamps or tanning beds/booths. What side effects may I notice from receiving this medicine? Side effects that you should report to your doctor or health care professional as soon as possible:  allergic reactions like skin rash, itching or hives, swelling of the face, lips, or tongue  low blood counts - this medicine may decrease the number of white blood cells, red blood cells and platelets. You may be at increased risk for infections and bleeding.  signs of infection - fever or chills, cough, sore throat, pain or difficulty passing urine  signs of decreased platelets or bleeding - bruising, pinpoint red spots on the skin, black, tarry stools, blood in the urine  signs of decreased red blood cells - unusually weak or tired, fainting spells, lightheadedness  breathing problems  changes in vision  chest pain  mouth sores  nausea and vomiting  pain, swelling, redness at site where injected  pain, tingling, numbness in the hands or feet  redness, swelling, or sores on hands or feet  stomach pain  unusual bleeding Side effects that usually do not require medical attention (report to your doctor or health care professional if they continue or are bothersome):  changes in finger or toe nails  diarrhea  dry or itchy skin  hair loss  headache  loss of appetite  sensitivity of eyes to the light  stomach upset  unusually teary eyes This list may not describe all possible side effects. Call your doctor for medical advice about side effects. You may report side effects to FDA at 1-800-FDA-1088. Where should I keep my medicine? This drug is given  in a hospital or clinic and will not be stored at home. NOTE: This sheet is a summary. It may not cover all possible information. If you have questions about this medicine, talk to your doctor, pharmacist, or health care provider.  2020 Elsevier/Gold Standard (2007-09-04 13:53:16) Oxaliplatin Injection What is this medicine? OXALIPLATIN (ox AL i PLA tin) is a chemotherapy drug. It targets fast dividing cells, like cancer cells, and causes these cells to die. This medicine is used to treat cancers of the colon and rectum, and many other cancers. This medicine may be used for other purposes; ask your health care provider or pharmacist if you have questions. COMMON BRAND NAME(S): Eloxatin What should I tell my health care provider before I take this medicine? They need to know if you have any of these conditions:  kidney disease  an unusual or allergic reaction to oxaliplatin, other chemotherapy, other medicines, foods, dyes, or preservatives  pregnant or trying to get pregnant  breast-feeding How should I use this medicine? This drug is given as an infusion into a vein. It is administered in a hospital or clinic by a specially trained health care professional. Talk to your pediatrician regarding the use of this medicine in children. Special care may be needed. Overdosage: If you think you have taken too much of this medicine contact a poison control center or emergency room at once. NOTE: This medicine is only for you. Do not share this medicine with others. What if I miss a dose? It is important not to miss a dose. Call your doctor or health care professional if you are unable to keep an  appointment. What may interact with this medicine?  medicines to increase blood counts like filgrastim, pegfilgrastim, sargramostim  probenecid  some antibiotics like amikacin, gentamicin, neomycin, polymyxin B, streptomycin, tobramycin  zalcitabine Talk to your doctor or health care professional  before taking any of these medicines:  acetaminophen  aspirin  ibuprofen  ketoprofen  naproxen This list may not describe all possible interactions. Give your health care provider a list of all the medicines, herbs, non-prescription drugs, or dietary supplements you use. Also tell them if you smoke, drink alcohol, or use illegal drugs. Some items may interact with your medicine. What should I watch for while using this medicine? Your condition will be monitored carefully while you are receiving this medicine. You will need important blood work done while you are taking this medicine. This medicine can make you more sensitive to cold. Do not drink cold drinks or use ice. Cover exposed skin before coming in contact with cold temperatures or cold objects. When out in cold weather wear warm clothing and cover your mouth and nose to warm the air that goes into your lungs. Tell your doctor if you get sensitive to the cold. This drug may make you feel generally unwell. This is not uncommon, as chemotherapy can affect healthy cells as well as cancer cells. Report any side effects. Continue your course of treatment even though you feel ill unless your doctor tells you to stop. In some cases, you may be given additional medicines to help with side effects. Follow all directions for their use. Call your doctor or health care professional for advice if you get a fever, chills or sore throat, or other symptoms of a cold or flu. Do not treat yourself. This drug decreases your body's ability to fight infections. Try to avoid being around people who are sick. This medicine may increase your risk to bruise or bleed. Call your doctor or health care professional if you notice any unusual bleeding. Be careful brushing and flossing your teeth or using a toothpick because you may get an infection or bleed more easily. If you have any dental work done, tell your dentist you are receiving this medicine. Avoid taking  products that contain aspirin, acetaminophen, ibuprofen, naproxen, or ketoprofen unless instructed by your doctor. These medicines may hide a fever. Do not become pregnant while taking this medicine. Women should inform their doctor if they wish to become pregnant or think they might be pregnant. There is a potential for serious side effects to an unborn child. Talk to your health care professional or pharmacist for more information. Do not breast-feed an infant while taking this medicine. Call your doctor or health care professional if you get diarrhea. Do not treat yourself. What side effects may I notice from receiving this medicine? Side effects that you should report to your doctor or health care professional as soon as possible:  allergic reactions like skin rash, itching or hives, swelling of the face, lips, or tongue  low blood counts - This drug may decrease the number of white blood cells, red blood cells and platelets. You may be at increased risk for infections and bleeding.  signs of infection - fever or chills, cough, sore throat, pain or difficulty passing urine  signs of decreased platelets or bleeding - bruising, pinpoint red spots on the skin, black, tarry stools, nosebleeds  signs of decreased red blood cells - unusually weak or tired, fainting spells, lightheadedness  breathing problems  chest pain, pressure  cough  diarrhea  jaw tightness  mouth sores  nausea and vomiting  pain, swelling, redness or irritation at the injection site  pain, tingling, numbness in the hands or feet  problems with balance, talking, walking  redness, blistering, peeling or loosening of the skin, including inside the mouth  trouble passing urine or change in the amount of urine Side effects that usually do not require medical attention (report to your doctor or health care professional if they continue or are bothersome):  changes in vision  constipation  hair loss  loss  of appetite  metallic taste in the mouth or changes in taste  stomach pain This list may not describe all possible side effects. Call your doctor for medical advice about side effects. You may report side effects to FDA at 1-800-FDA-1088. Where should I keep my medicine? This drug is given in a hospital or clinic and will not be stored at home. NOTE: This sheet is a summary. It may not cover all possible information. If you have questions about this medicine, talk to your doctor, pharmacist, or health care provider.  2020 Elsevier/Gold Standard (2007-11-26 17:22:47)

## 2019-05-11 DIAGNOSIS — C786 Secondary malignant neoplasm of retroperitoneum and peritoneum: Secondary | ICD-10-CM | POA: Insufficient documentation

## 2019-05-19 ENCOUNTER — Encounter: Payer: Self-pay | Admitting: Hematology and Oncology

## 2019-06-05 NOTE — Progress Notes (Signed)
[facility]  [address], [address] [place], [place] [address] Phone: [phone]  Fax: [phone]   Clinic Day:  06/06/2019  Referring physician: Tracie Harrier, MD  Chief Complaint: Jared Gutierrez is a 79 y.o. male with metastaticstage I colon cancer who is seen for discussion of biopsy results.    HPI: The patient was last seen in the medical oncology clinic via telemedicine on 04/25/2019. At that time, he was doing well.  He noted issues with gout.  We discussed potential treatment options.  Additional testing could not be performed on the endoscopic biopsy performed on 04/17/2019 due to the small sample size.  Per pathology, tissue was sent from his original surgery.  Omniseq on 05/15/2019 revealed + KRAS and TP53.  Negative results included BRAF V600E, Her2, NRAS, NTRK, PD-L1 (<1%), and TMB 8.7/Mb (intermediate).  During the interim, he has felt "alright".  He doesn't take his BP at home. He was put on another BP pill by Dr. Ginette Pitman. He hasn't seen Dr Ginette Pitman since 01/2019.  He has a follow up next month. He denies any abdominal symptoms. He had cortisone injections on 06/04/2019.   He and his wife will receive their COVID vaccine tomorrow.   Past Medical History:  Diagnosis Date  . Arthritis   . Cancer ([place])   . Cavitary lesion of lung   . Chicken pox   . Colon cancer ([place])   . History of kidney stones   . Hypertension   . Lipoma of colon   . Nephrolithiasis   . Nephrolithiasis   . Obesity   . Shingles   . Tubular adenoma of colon    multiple fragments    Past Surgical History:  Procedure Laterality Date  . COLON SURGERY    . COLONOSCOPY N/A 10/02/2014   Procedure: COLONOSCOPY;  Surgeon: Josefine Class, MD;  Location: [facility] ENDOSCOPY;  Service: Endoscopy;  Laterality: N/A;  . COLONOSCOPY WITH PROPOFOL N/A 02/16/2017   Procedure: COLONOSCOPY WITH PROPOFOL;  Surgeon: Manya Silvas, MD;  Location: [facility] ENDOSCOPY;  Service:  Endoscopy;  Laterality: N/A;  . EUS N/A 04/17/2019   Procedure: FULL UPPER ENDOSCOPIC ULTRASOUND (EUS) RADIAL;  Surgeon: Holly Bodily, MD;  Location: [facility] ENDOSCOPY;  Service: Gastroenterology;  Laterality: N/A;    Family History  Problem Relation Age of Onset  . Cancer Mother   . Breast cancer Mother   . COPD Father     Social History:  reports that he has never smoked. He has never used smokeless tobacco. He reports current alcohol use. He reports that he does not use drugs. 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He is married and his wife's name is [name] [phone]).  The patient is accompanied by his wife via video today.  Allergies: No Known Allergies  Current Medications: Current Outpatient Medications  Medication Sig Dispense Refill  . amLODipine (NORVASC) 2.5 MG tablet Take 2.5 mg by mouth daily.     [name] aspirin 81 MG tablet Take 81 mg by mouth daily.    [name] glucosamine-chondroitin 500-400 MG tablet Take 1 tablet by mouth daily.    [name] olmesartan (BENICAR) 20 MG tablet Take 20 mg by mouth daily.    . Probiotic Product (PROBIOTIC DAILY PO) Take 1 tablet by mouth daily.    . ferrous sulfate 325 (65 FE) MG EC tablet Take 325 mg by mouth daily with breakfast.      No current facility-administered medications  for this visit.    Review of Systems  Constitutional: Negative.  Negative for chills, diaphoresis, fever, malaise/fatigue and weight loss.       Feels "alright".  HENT: Negative.  Negative for congestion, ear pain, nosebleeds, sinus pain and sore throat.   Eyes: Negative.  Negative for blurred vision, double vision, photophobia and pain.  Respiratory: Negative.  Negative for cough, hemoptysis, sputum production and shortness of breath.   Cardiovascular: Positive for leg swelling. Negative for chest pain, palpitations, orthopnea and PND.  Gastrointestinal: Negative.  Negative for abdominal pain, blood in stool, constipation, diarrhea,  melena, nausea and vomiting.  Genitourinary: Negative.  Negative for dysuria, frequency, hematuria and urgency.  Musculoskeletal: Positive for joint pain (chronic). Negative for back pain, falls, myalgias and neck pain.  Skin: Negative.  Negative for itching and rash.  Neurological: Negative.  Negative for dizziness, tremors, sensory change, speech change, focal weakness, weakness and headaches.  Endo/Heme/Allergies: Negative.  Does not bruise/bleed easily.  Psychiatric/Behavioral: Negative.  Negative for depression and memory loss. The patient is not nervous/anxious and does not have insomnia.   All other systems reviewed and are negative.  Performance status (ECOG): 0  Vitals Blood pressure (!) 155/110, pulse 60, temperature 98.3 F (36.8 C), temperature source Oral, resp. rate 18, weight 266 lb 8.6 oz (120.9 kg), SpO2 98 %.   Physical Exam  Constitutional: He is oriented to person, place, and time. He appears well-developed and well-nourished. No distress.  HENT:  Head: Normocephalic and atraumatic.  Cap and mask.  Eyes: Conjunctivae and EOM are normal. No scleral icterus.  Hazel eyes.  Neurological: He is alert and oriented to person, place, and time.  Skin: He is not diaphoretic. No pallor.  Psychiatric: He has a normal mood and affect. His behavior is normal. Judgment and thought content normal.  Nursing note reviewed.   No visits with results within 3 Day(s) from this visit.  Latest known visit with results is:  Admission on 04/17/2019, Discharged on 04/17/2019  Component Date Value Ref Range Status  . CYTOLOGY - NON GYN 04/17/2019    Final-Edited                   Value:CYTOLOGY - NON PAP THIS IS AN ADDENDUM REPORT CASE: [id number] PATIENT: Jared Gutierrez Non-Gynecological Cytology Report Addendum  Reason for Addendum #1:  Clerical  Specimen Submitted: A. Peripancreatic mass; FNA  Clinical History: Peripancreatic mass  DIAGNOSIS: A. PERIPANCREATIC MASS;  EUS-GUIDED FINE NEEDLE ASPIRATION BIOPSY: - DIAGNOSTIC OF MALIGNANCY. - ADENOCARCINOMA; SEE COMMENT.  Comment: The tumor cells are positive for CK20 and CDX2.  They are negative for CK7.  In the proper clinical context, the findings are compatible with a metastatic lesion of colorectal origin.  Clinical correlation is recommended.  GROSS DESCRIPTION: A. Site: Peripancreatic mass Procedure: Endoscopic ultrasound Cytology specimen: FNA/needle biopsy and rinse Cytotechnologist(s): Molli Barrows and Vance Peper  Specimen material collected and submitted for:  1 Diff Quik stained slides  1 Pap stained slides Specimen material su                         bmitted for: Cell block and ThinPrep  Specimen description: Fixative: CytoLyt Volume: 15 mL Color: Colorless Transparency: Clear Tissue fragments present: Yes Collection brush(es) present: No  Final Diagnosis performed by Betsy Pries, MD.   Electronically signed 04/21/2019 12:54:42PM The electronic signature indicates that the named Attending Pathologist has evaluated the specimen Technical component performed at [facility], [place]  [address] [place], [place] [address] Lab: [phone] Dir: Rush Farmer, MD, MMM  Professional component performed at [facility], [facility], [place], [place], [place] [address] Lab: [phone] Dir: Dellia Nims. Reuel Derby, MD  ADDENDUM: IHC slides were prepared by [facility], [place], [place]. All controls stained appropriately.  This test was developed and its performance characteristics determined by [facility]. It has not been cleared or approved by the [place] Food and Drug Administrati                         on. The FDA does not require this test to go through premarket FDA review. This test is used for clinical purposes. It should not be regarded as investigational or for research. This laboratory is certified under the Clinical Laboratory  Improvement Amendments (CLIA) as qualified to perform high complexity clinical laboratory testing.  Addendum #1 performed by Betsy Pries, MD.   Electronically signed 04/21/2019 1:04:53PM The electronic signature indicates that the named Attending Pathologist has evaluated the specimen Technical component performed at [facility], [address], [place], [place] [address] Lab: [phone] Dir: Rush Farmer, MD, MMM  Professional component performed at [facility], [facility], [place], [place], [place] [address] Lab: [phone] Dir: Dellia Nims. Reuel Derby, MD    Assessment:  Jared Gutierrez is a 79 y.o. male with metastatic colon cancer.  He was diagnosed with stage I colon cancers/p transverse colectomy on 10/17/2013. 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 Tumor extended into the submucosa. Margins were negative. There was lymphovascular invasion. 14 lymph nodes were negative.Pathologic stagewas T1 N0.  Colonoscopyon 10/02/2014 noted several 3 mm polyps and an 8 mm polyp in the cecum and transverse colon. Pathology revealed tubular adenomas negative for high-grade dysplasia and malignancy. Colonoscopyon 02/16/2017 revealed 2 diminutive polyps in the descending colon. Pathology revealed tubular adenomas without dysplasia or malignancy.  CEAhas been followed: 3.1 on 04/27/2014, 2.3 on 11/11/2014, 3.0 on 05/12/2015, 2.7 on 11/10/2015, 3.2 on 05/10/2016, 2.8 on 11/01/2016, 3.3 on 04/25/2017, 3.1 on 10/31/2017, 4.0 on 05/01/2018, 7.0 on 11/28/2018, 7.4 on 12/13/2018, and 10.5 on 03/25/2019.  Chest, abdomen and pelvis CTon 12/20/2018 revealed postoperative findings of transverse colon resection without evidence of recurrent mass, definite lymphadenopathy, or distant metastatic disease to explain rising CEA. Therewereprominent sub-centimeter retroperitoneal and iliac  lymph nodes, uncertain significance. There were postoperative findings about the left renal hilum, of uncertain nature, with a broad-based postoperative fat containing left-sided lumbar hernia.There was bibasilar bronchiectasis and scarring with multiple post infectious or inflammatory pneumatoceles, unchanged in comparison to CT date 06/23/2011.  PET scanon 04/02/2019 revealed a2.4 x 2.3 cm (SUV 11)hypermetabolic soft tissue density caudal and anterior to the pancreatic neckfavored to represent isolated peritoneal or nodal metastasis in the setting of prior transverse colonic resection (expected primary drainage). Although this was immediately adjacent to the pancreas, a fat plane was maintained, arguing strongly against a pancreatic primary. Otherwise,there wasno evidence of hypermetabolic metastasis.  Upper endoscopic ultrasound on 04/17/2019 revealed a normal esophagus, stomach, duodenum, and pancreas.  There was a 2.4 x 2.4 cm irregular mass in the retroperitoneum adjacent to, but not involving the pancreatic neck.  FNA and core needle biopsy were performed.  Pathology revealed adenocarcinoma compatible with a metastatic lesion of colorectal origin.  Tumor cells were positive for CK20 and CDX2 and negative for CK7.  Omniseq on 05/15/2019 revealed +  KRAS and TP53.  Negative results included BRAF V600E, Her2, NRAS, NTRK, PD-L1 (<1%), and TMB 8.7/Mb (intermediate).  He has chronic right lower extremity edema.  Right lower extremity duplex on 12/24/2018 revealed no DVT.  He has a small right Baker's cyst.  He plans to receive his COVID-19 vaccine tomorrow.  Symptomatically, he feels alright.  Plan: 1.  Metastatic colon cancer             Clinically, he continues to do well.             He initially presented with stage I disease s/p resection. CEA was 4.0 on 05/01/2018, 7.0 on 11/28/2018,7.4 on07/31/2020, and 10.5 on 03/25/2019. Chest abdomen and pelvic  CTon 12/20/2018.revealedprominent subcentimeter retroperitoneal and iliac lymph nodes. PET scan on 04/02/2019 revealeda2.4 x 2.3 cm (SUV 11)soft tissue density caudal and anterior to the pancreatic neck. 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                         Lesion is not resectable.             Discuss chemotherapy                         FOLFOX, CapeOx or Xeloda.   Side effects of 5FU, oxaliplatin, Xeloda in detail.   Information provided.  Discuss potential addition of Avastin (bevacuzamab).   Potential side effects reviewed.   Patient declined.             NGS revealed +KRAS and -NRAS, BRAF.   Follow-up MMR/MSI- quantity insufficient.   Send original tumor for MSI/MMR to determine if pembrolizumab or nivolumab/ipilumumab an option in the future.  Patient would like to proceed with FOLFOX.   Preauth FOLFOX.  Discuss chemotherapy class.  Discuss port-a-cath placement. 2.  Surgery consult [facility])- port placement. 3.   Schedule chemotherapy class. 4.   RTC for MD assessment, labs (CBC with diff, CMP, CEA), and cycle #1 FOLFOX.  I discussed the assessment and treatment plan with the patient.  The patient was provided an opportunity to ask questions and all were answered.  The patient agreed with the plan and demonstrated an understanding of the instructions.  The patient was advised to call back if the symptoms worsen or if the condition fails to improve as anticipated.  I provided 31 minutes (11:40 AM - 12:10 PM) of face-to-face time during this this encounter and > 50% was spent counseling as documented under my assessment and plan.    Lequita Asal, MD, PhD    06/06/2019, 12:10 PM  I, Samul Dada, am acting as a scribe for Lequita Asal, MD.  I, [place] Mike Gip, MD, have reviewed the above documentation for accuracy and completeness, and I agree with the above.

## 2019-06-05 NOTE — Progress Notes (Signed)
Confirmed Name and DOB. Assessment completed with assistance from wife, [name]. Denies any concerns.

## 2019-06-06 ENCOUNTER — Other Ambulatory Visit: Payer: Self-pay | Admitting: Hematology and Oncology

## 2019-06-06 ENCOUNTER — Other Ambulatory Visit: Payer: Self-pay

## 2019-06-06 ENCOUNTER — Inpatient Hospital Stay: Payer: Medicare Other | Attending: Hematology and Oncology | Admitting: Hematology and Oncology

## 2019-06-06 VITALS — BP 155/110 | HR 60 | Temp 98.3°F | Resp 18 | Wt 266.5 lb

## 2019-06-06 DIAGNOSIS — I10 Essential (primary) hypertension: Secondary | ICD-10-CM | POA: Diagnosis not present

## 2019-06-06 DIAGNOSIS — Z85038 Personal history of other malignant neoplasm of large intestine: Secondary | ICD-10-CM | POA: Diagnosis present

## 2019-06-06 DIAGNOSIS — Z7982 Long term (current) use of aspirin: Secondary | ICD-10-CM | POA: Diagnosis not present

## 2019-06-06 DIAGNOSIS — M109 Gout, unspecified: Secondary | ICD-10-CM | POA: Diagnosis not present

## 2019-06-06 DIAGNOSIS — Z79899 Other long term (current) drug therapy: Secondary | ICD-10-CM | POA: Insufficient documentation

## 2019-06-06 DIAGNOSIS — C786 Secondary malignant neoplasm of retroperitoneum and peritoneum: Secondary | ICD-10-CM | POA: Insufficient documentation

## 2019-06-06 NOTE — Patient Instructions (Signed)
Oxaliplatin Injection What is this medicine? OXALIPLATIN (ox AL i PLA tin) is a chemotherapy drug. It targets fast dividing cells, like cancer cells, and causes these cells to die. This medicine is used to treat cancers of the colon and rectum, and many other cancers. This medicine may be used for other purposes; ask your health care provider or pharmacist if you have questions. COMMON BRAND NAME(S): Eloxatin What should I tell my health care provider before I take this medicine? They need to know if you have any of these conditions:  heart disease  history of irregular heartbeat  liver disease  low blood counts, like white cells, platelets, or red blood cells  lung or breathing disease, like asthma  take medicines that treat or prevent blood clots  tingling of the fingers or toes, or other nerve disorder  an unusual or allergic reaction to oxaliplatin, other chemotherapy, other medicines, foods, dyes, or preservatives  pregnant or trying to get pregnant  breast-feeding How should I use this medicine? This drug is given as an infusion into a vein. It is administered in a hospital or clinic by a specially trained health care professional. Talk to your pediatrician regarding the use of this medicine in children. Special care may be needed. Overdosage: If you think you have taken too much of this medicine contact a poison control center or emergency room at once. NOTE: This medicine is only for you. Do not share this medicine with others. What if I miss a dose? It is important not to miss a dose. Call your doctor or health care professional if you are unable to keep an appointment. What may interact with this medicine? Do not take this medicine with any of the following medications:  cisapride  dronedarone  pimozide  thioridazine This medicine may also interact with the following medications:  aspirin and aspirin-like medicines  certain medicines that treat or prevent  blood clots like warfarin, apixaban, dabigatran, and rivaroxaban  cisplatin  cyclosporine  diuretics  medicines for infection like acyclovir, adefovir, amphotericin B, bacitracin, cidofovir, foscarnet, ganciclovir, gentamicin, pentamidine, vancomycin  NSAIDs, medicines for pain and inflammation, like ibuprofen or naproxen  other medicines that prolong the QT interval (an abnormal heart rhythm)  pamidronate  zoledronic acid This list may not describe all possible interactions. Give your health care provider a list of all the medicines, herbs, non-prescription drugs, or dietary supplements you use. Also tell them if you smoke, drink alcohol, or use illegal drugs. Some items may interact with your medicine. What should I watch for while using this medicine? Your condition will be monitored carefully while you are receiving this medicine. You may need blood work done while you are taking this medicine. This medicine may make you feel generally unwell. This is not uncommon as chemotherapy can affect healthy cells as well as cancer cells. Report any side effects. Continue your course of treatment even though you feel ill unless your healthcare professional tells you to stop. This medicine can make you more sensitive to cold. Do not drink cold drinks or use ice. Cover exposed skin before coming in contact with cold temperatures or cold objects. When out in cold weather wear warm clothing and cover your mouth and nose to warm the air that goes into your lungs. Tell your doctor if you get sensitive to the cold. Do not become pregnant while taking this medicine or for 9 months after stopping it. Women should inform their health care professional if they wish to become   pregnant or think they might be pregnant. Men should not father a child while taking this medicine and for 6 months after stopping it. There is potential for serious side effects to an unborn child. Talk to your health care professional  for more information. Do not breast-feed a child while taking this medicine or for 3 months after stopping it. This medicine has caused ovarian failure in some women. This medicine may make it more difficult to get pregnant. Talk to your health care professional if you are concerned about your fertility. This medicine has caused decreased sperm counts in some men. This may make it more difficult to father a child. Talk to your health care professional if you are concerned about your fertility. This medicine may increase your risk of getting an infection. Call your health care professional for advice if you get a fever, chills, or sore throat, or other symptoms of a cold or flu. Do not treat yourself. Try to avoid being around people who are sick. Avoid taking medicines that contain aspirin, acetaminophen, ibuprofen, naproxen, or ketoprofen unless instructed by your health care professional. These medicines may hide a fever. Be careful brushing or flossing your teeth or using a toothpick because you may get an infection or bleed more easily. If you have any dental work done, tell your dentist you are receiving this medicine. What side effects may I notice from receiving this medicine? Side effects that you should report to your doctor or health care professional as soon as possible:  allergic reactions like skin rash, itching or hives, swelling of the face, lips, or tongue  breathing problems  cough  low blood counts - this medicine may decrease the number of white blood cells, red blood cells, and platelets. You may be at increased risk for infections and bleeding  nausea, vomiting  pain, redness, or irritation at site where injected  pain, tingling, numbness in the hands or feet  signs and symptoms of bleeding such as bloody or black, tarry stools; red or dark brown urine; spitting up blood or brown material that looks like coffee grounds; red spots on the skin; unusual bruising or bleeding  from the eyes, gums, or nose  signs and symptoms of a dangerous change in heartbeat or heart rhythm like chest pain; dizziness; fast, irregular heartbeat; palpitations; feeling faint or lightheaded; falls  signs and symptoms of infection like fever; chills; cough; sore throat; pain or trouble passing urine  signs and symptoms of liver injury like dark yellow or brown urine; general ill feeling or flu-like symptoms; light-colored stools; loss of appetite; nausea; right upper belly pain; unusually weak or tired; yellowing of the eyes or skin  signs and symptoms of low red blood cells or anemia such as unusually weak or tired; feeling faint or lightheaded; falls  signs and symptoms of muscle injury like dark urine; trouble passing urine or change in the amount of urine; unusually weak or tired; muscle pain; back pain Side effects that usually do not require medical attention (report to your doctor or health care professional if they continue or are bothersome):  changes in taste  diarrhea  gas  hair loss  loss of appetite  mouth sores This list may not describe all possible side effects. Call your doctor for medical advice about side effects. You may report side effects to FDA at 1-800-FDA-1088. Where should I keep my medicine? This drug is given in a hospital or clinic and will not be stored at home. NOTE:   This sheet is a summary. It may not cover all possible information. If you have questions about this medicine, talk to your doctor, pharmacist, or health care provider.  2020 [employer] Standard (2018-09-18 12:20:35) Fluorouracil, 5-FU injection What is this medicine? FLUOROURACIL, 5-FU (flure oh YOOR a sil) is a chemotherapy drug. It slows the growth of cancer cells. This medicine is used to treat many types of cancer like breast cancer, colon or rectal cancer, pancreatic cancer, and stomach cancer. This medicine may be used for other purposes; ask your health care provider or  pharmacist if you have questions. COMMON BRAND NAME(S): Adrucil What should I tell my health care provider before I take this medicine? They need to know if you have any of these conditions:  blood disorders  dihydropyrimidine dehydrogenase (DPD) deficiency  infection (especially a virus infection such as chickenpox, cold sores, or herpes)  kidney disease  liver disease  malnourished, poor nutrition  recent or ongoing radiation therapy  an unusual or allergic reaction to fluorouracil, other chemotherapy, other medicines, foods, dyes, or preservatives  pregnant or trying to get pregnant  breast-feeding How should I use this medicine? This drug is given as an infusion or injection into a vein. It is administered in a hospital or clinic by a specially trained health care professional. Talk to your pediatrician regarding the use of this medicine in children. Special care may be needed. Overdosage: If you think you have taken too much of this medicine contact a poison control center or emergency room at once. NOTE: This medicine is only for you. Do not share this medicine with others. What if I miss a dose? It is important not to miss your dose. Call your doctor or health care professional if you are unable to keep an appointment. What may interact with this medicine?  allopurinol  cimetidine  dapsone  digoxin  hydroxyurea  leucovorin  levamisole  medicines for seizures like ethotoin, fosphenytoin, phenytoin  medicines to increase blood counts like filgrastim, pegfilgrastim, sargramostim  medicines that treat or prevent blood clots like warfarin, enoxaparin, and dalteparin  methotrexate  metronidazole  pyrimethamine  some other chemotherapy drugs like busulfan, cisplatin, estramustine, vinblastine  trimethoprim  trimetrexate  vaccines Talk to your doctor or health care professional before taking any of these  medicines:  acetaminophen  aspirin  ibuprofen  ketoprofen  naproxen This list may not describe all possible interactions. Give your health care provider a list of all the medicines, herbs, non-prescription drugs, or dietary supplements you use. Also tell them if you smoke, drink alcohol, or use illegal drugs. Some items may interact with your medicine. What should I watch for while using this medicine? Visit your doctor for checks on your progress. This drug may make you feel generally unwell. This is not uncommon, as chemotherapy can affect healthy cells as well as cancer cells. Report any side effects. Continue your course of treatment even though you feel ill unless your doctor tells you to stop. In some cases, you may be given additional medicines to help with side effects. Follow all directions for their use. Call your doctor or health care professional for advice if you get a fever, chills or sore throat, or other symptoms of a cold or flu. Do not treat yourself. This drug decreases your body's ability to fight infections. Try to avoid being around people who are sick. This medicine may increase your risk to bruise or bleed. Call your doctor or health care professional if you notice any   unusual bleeding. Be careful brushing and flossing your teeth or using a toothpick because you may get an infection or bleed more easily. If you have any dental work done, tell your dentist you are receiving this medicine. Avoid taking products that contain aspirin, acetaminophen, ibuprofen, naproxen, or ketoprofen unless instructed by your doctor. These medicines may hide a fever. Do not become pregnant while taking this medicine. Women should inform their doctor if they wish to become pregnant or think they might be pregnant. There is a potential for serious side effects to an unborn child. Talk to your health care professional or pharmacist for more information. Do not breast-feed an infant while taking  this medicine. Men should inform their doctor if they wish to father a child. This medicine may lower sperm counts. Do not treat diarrhea with over the counter products. Contact your doctor if you have diarrhea that lasts more than 2 days or if it is severe and watery. This medicine can make you more sensitive to the sun. Keep out of the sun. If you cannot avoid being in the sun, wear protective clothing and use sunscreen. Do not use sun lamps or tanning beds/booths. What side effects may I notice from receiving this medicine? Side effects that you should report to your doctor or health care professional as soon as possible:  allergic reactions like skin rash, itching or hives, swelling of the face, lips, or tongue  low blood counts - this medicine may decrease the number of white blood cells, red blood cells and platelets. You may be at increased risk for infections and bleeding.  signs of infection - fever or chills, cough, sore throat, pain or difficulty passing urine  signs of decreased platelets or bleeding - bruising, pinpoint red spots on the skin, black, tarry stools, blood in the urine  signs of decreased red blood cells - unusually weak or tired, fainting spells, lightheadedness  breathing problems  changes in vision  chest pain  mouth sores  nausea and vomiting  pain, swelling, redness at site where injected  pain, tingling, numbness in the hands or feet  redness, swelling, or sores on hands or feet  stomach pain  unusual bleeding Side effects that usually do not require medical attention (report to your doctor or health care professional if they continue or are bothersome):  changes in finger or toe nails  diarrhea  dry or itchy skin  hair loss  headache  loss of appetite  sensitivity of eyes to the light  stomach upset  unusually teary eyes This list may not describe all possible side effects. Call your doctor for medical advice about side effects.  You may report side effects to FDA at 1-800-FDA-1088. Where should I keep my medicine? This drug is given in a hospital or clinic and will not be stored at home. NOTE: This sheet is a summary. It may not cover all possible information. If you have questions about this medicine, talk to your doctor, pharmacist, or health care provider.  2020 [employer] Standard (2007-09-04 13:53:16) Leucovorin injection What is this medicine? LEUCOVORIN (loo koe VOR in) is used to prevent or treat the harmful effects of some medicines. This medicine is used to treat anemia caused by a low amount of folic acid in the body. It is also used with 5-fluorouracil (5-FU) to treat colon cancer. This medicine may be used for other purposes; ask your health care provider or pharmacist if you have questions. What should I tell my health care   provider before I take this medicine? They need to know if you have any of these conditions:  anemia from low levels of vitamin B-12 in the blood  an unusual or allergic reaction to leucovorin, folic acid, other medicines, foods, dyes, or preservatives  pregnant or trying to get pregnant  breast-feeding How should I use this medicine? This medicine is for injection into a muscle or into a vein. It is given by a health care professional in a hospital or clinic setting. Talk to your pediatrician regarding the use of this medicine in children. Special care may be needed. Overdosage: If you think you have taken too much of this medicine contact a poison control center or emergency room at once. NOTE: This medicine is only for you. Do not share this medicine with others. What if I miss a dose? This does not apply. What may interact with this medicine?  capecitabine  fluorouracil  phenobarbital  phenytoin  primidone  trimethoprim-sulfamethoxazole This list may not describe all possible interactions. Give your health care provider a list of all the medicines, herbs,  non-prescription drugs, or dietary supplements you use. Also tell them if you smoke, drink alcohol, or use illegal drugs. Some items may interact with your medicine. What should I watch for while using this medicine? Your condition will be monitored carefully while you are receiving this medicine. This medicine may increase the side effects of 5-fluorouracil, 5-FU. Tell your doctor or health care professional if you have diarrhea or mouth sores that do not get better or that get worse. What side effects may I notice from receiving this medicine? Side effects that you should report to your doctor or health care professional as soon as possible:  allergic reactions like skin rash, itching or hives, swelling of the face, lips, or tongue  breathing problems  fever, infection  mouth sores  unusual bleeding or bruising  unusually weak or tired Side effects that usually do not require medical attention (report to your doctor or health care professional if they continue or are bothersome):  constipation or diarrhea  loss of appetite  nausea, vomiting This list may not describe all possible side effects. Call your doctor for medical advice about side effects. You may report side effects to FDA at 1-800-FDA-1088. Where should I keep my medicine? This drug is given in a hospital or clinic and will not be stored at home. NOTE: This sheet is a summary. It may not cover all possible information. If you have questions about this medicine, talk to your doctor, pharmacist, or health care provider.  2020 [employer] Standard (2007-11-05 16:50:29)  

## 2019-06-06 NOTE — Patient Instructions (Signed)
 Capecitabine tablets What is this medicine? CAPECITABINE (ka pe SITE a been) is a chemotherapy drug. It slows the growth of cancer cells. This medicine is used to treat breast cancer, and also colon or rectal cancer. This medicine may be used for other purposes; ask your health care provider or pharmacist if you have questions. COMMON BRAND NAME(S): Xeloda What should I tell my health care provider before I take this medicine? They need to know if you have any of these conditions:  bleeding disorders  dehydration  dihydropyrimidine dehydrogenase (DPD) deficiency  heart disease  infection (especially a virus infection such as chickenpox, cold sores, or herpes)  kidney disease  liver disease  low blood counts, like low white cell, platelet, or red cell counts  an unusual or allergic reaction to capecitabine, fluorouracil, other medicines, foods, dyes, or preservatives  pregnant or trying to get pregnant  breast-feeding How should I use this medicine? Take this medicine by mouth with a glass of water, within 30 minutes of the end of a meal. Do not cut, crush or chew this medicine. Follow the directions on the prescription label. Take your medicine at regular intervals. Do not take it more often than directed. Do not stop taking except on your doctor's advice. Your doctor may want you to take a combination of 150 mg and 500 mg tablets for each dose. It is very important that you know how to correctly take your dose. Taking the wrong tablets could result in an overdose (too much medication) or underdose (too little medication). Talk to your pediatrician regarding the use of this medicine in children. Special care may be needed. Overdosage: If you think you have taken too much of this medicine contact a poison control center or emergency room at once. NOTE: This medicine is only for you. Do not share this medicine with others. What if I miss a dose? If you miss a dose, do not take  the missed dose at all. Do not take double or extra doses. Instead, continue with your next scheduled dose and check with your doctor. What may interact with this medicine? This medicine may interact with the following medications:  allopurinol  leucovorin  phenytoin  warfarin This list may not describe all possible interactions. Give your health care provider a list of all the medicines, herbs, non-prescription drugs, or dietary supplements you use. Also tell them if you smoke, drink alcohol, or use illegal drugs. Some items may interact with your medicine. What should I watch for while using this medicine? Visit your doctor for checks on your progress. This drug may make you feel generally unwell. This is not uncommon, as chemotherapy can affect healthy cells as well as cancer cells. Report any side effects. Continue your course of treatment even though you feel ill unless your doctor tells you to stop. In some cases, you may be given additional medicines to help with side effects. Follow all directions for their use. Call your doctor or health care professional for advice if you get a fever, chills or sore throat, or other symptoms of a cold or flu. Do not treat yourself. This drug decreases your body's ability to fight infections. Try to avoid being around people who are sick. This medicine may increase your risk to bruise or bleed. Call your doctor or health care professional if you notice any unusual bleeding. Be careful brushing and flossing your teeth or using a toothpick because you may get an infection or bleed more easily. If   have any dental work done, tell your dentist you are receiving this medicine. Avoid taking products that contain aspirin, acetaminophen, ibuprofen, naproxen, or ketoprofen unless instructed by your doctor. These medicines may hide a fever. Do not become pregnant while taking this medicine or for 6 months after stopping it. Women should inform their doctor if they  wish to become pregnant or think they might be pregnant. There is a potential for serious side effects to an unborn child. Talk to your health care professional or pharmacist for more information. Do not breast-feed an infant while taking this medicine or for 2 weeks after stopping it. Men are advised not to father a child while taking this medicine or for 3 months after stopping it. This medicine may make it more difficult to get pregnant or father a child. Talk with your doctor or health care professional if you are concerned about your fertility. What side effects may I notice from receiving this medicine? Side effects that you should report to your doctor or health care professional as soon as possible:  allergic reactions like skin rash, itching or hives, swelling of the face, lips, or tongue  diarrhea  low blood counts - this medicine may decrease the number of white blood cells, red blood cells and platelets. You may be at increased risk for infections and bleeding  nausea, vomiting  redness, blistering, peeling or loosening of the skin, including inside the mouth (this can be added for any serious or exfoliative rash that could lead to hospitalization)  redness, swelling, or sores on hands or feet  signs and symptoms of kidney injury like trouble passing urine or change in the amount of urine  signs and symptoms of liver injury like dark yellow or brown urine; general ill feeling or flu-like symptoms; light-colored stools; loss of appetite; nausea; right upper belly pain; unusually weak or tired; yellowing of the eyes or skin  signs of decreased platelets or bleeding - bruising, pinpoint red spots on the skin, black, tarry stools, blood in the urine  signs of decreased red blood cells - unusually weak or tired, fainting spells, lightheadedness  signs of infection - fever or chills, cough, sore throat, pain or difficulty passing urine Side effects that usually do not require medical  attention (report to your doctor or health care professional if they continue or are bothersome):  changes in vision  constipation  loss of appetite  mouth sores  pain, tingling, numbness in the hands or feet This list may not describe all possible side effects. Call your doctor for medical advice about side effects. You may report side effects to FDA at 1-800-FDA-1088. Where should I keep my medicine? Keep out of the reach of children. Store at room temperature between 15 and 30 degrees C (59 and 86 degrees F). Keep container tightly closed. Throw away any unused medicine after the expiration date. NOTE: This sheet is a summary. It may not cover all possible information. If you have questions about this medicine, talk to your doctor, pharmacist, or health care provider.  2020 [employer] Standard (2017-08-14 21:22:45)   Fluorouracil, 5-FU injection What is this medicine? FLUOROURACIL, 5-FU (flure oh YOOR a sil) is a chemotherapy drug. It slows the growth of cancer cells. This medicine is used to treat many types of cancer like breast cancer, colon or rectal cancer, pancreatic cancer, and stomach cancer. This medicine may be used for other purposes; ask your health care provider or pharmacist if you have questions. COMMON BRAND NAME(S):  Adrucil What should I tell my health care provider before I take this medicine? They need to know if you have any of these conditions:  blood disorders  dihydropyrimidine dehydrogenase (DPD) deficiency  infection (especially a virus infection such as chickenpox, cold sores, or herpes)  kidney disease  liver disease  malnourished, poor nutrition  recent or ongoing radiation therapy  an unusual or allergic reaction to fluorouracil, other chemotherapy, other medicines, foods, dyes, or preservatives  pregnant or trying to get pregnant  breast-feeding How should I use this medicine? This drug is given as an infusion or injection into a  vein. It is administered in a hospital or clinic by a specially trained health care professional. Talk to your pediatrician regarding the use of this medicine in children. Special care may be needed. Overdosage: If you think you have taken too much of this medicine contact a poison control center or emergency room at once. NOTE: This medicine is only for you. Do not share this medicine with others. What if I miss a dose? It is important not to miss your dose. Call your doctor or health care professional if you are unable to keep an appointment. What may interact with this medicine?  allopurinol  cimetidine  dapsone  digoxin  hydroxyurea  leucovorin  levamisole  medicines for seizures like ethotoin, fosphenytoin, phenytoin  medicines to increase blood counts like filgrastim, pegfilgrastim, sargramostim  medicines that treat or prevent blood clots like warfarin, enoxaparin, and dalteparin  methotrexate  metronidazole  pyrimethamine  some other chemotherapy drugs like busulfan, cisplatin, estramustine, vinblastine  trimethoprim  trimetrexate  vaccines Talk to your doctor or health care professional before taking any of these medicines:  acetaminophen  aspirin  ibuprofen  ketoprofen  naproxen This list may not describe all possible interactions. Give your health care provider a list of all the medicines, herbs, non-prescription drugs, or dietary supplements you use. Also tell them if you smoke, drink alcohol, or use illegal drugs. Some items may interact with your medicine. What should I watch for while using this medicine? Visit your doctor for checks on your progress. This drug may make you feel generally unwell. This is not uncommon, as chemotherapy can affect healthy cells as well as cancer cells. Report any side effects. Continue your course of treatment even though you feel ill unless your doctor tells you to stop. In some cases, you may be given additional  medicines to help with side effects. Follow all directions for their use. Call your doctor or health care professional for advice if you get a fever, chills or sore throat, or other symptoms of a cold or flu. Do not treat yourself. This drug decreases your body's ability to fight infections. Try to avoid being around people who are sick. This medicine may increase your risk to bruise or bleed. Call your doctor or health care professional if you notice any unusual bleeding. Be careful brushing and flossing your teeth or using a toothpick because you may get an infection or bleed more easily. If you have any dental work done, tell your dentist you are receiving this medicine. Avoid taking products that contain aspirin, acetaminophen, ibuprofen, naproxen, or ketoprofen unless instructed by your doctor. These medicines may hide a fever. Do not become pregnant while taking this medicine. Women should inform their doctor if they wish to become pregnant or think they might be pregnant. There is a potential for serious side effects to an unborn child. Talk to your health care professional  or pharmacist for more information. Do not breast-feed an infant while taking this medicine. Men should inform their doctor if they wish to father a child. This medicine may lower sperm counts. Do not treat diarrhea with over the counter products. Contact your doctor if you have diarrhea that lasts more than 2 days or if it is severe and watery. This medicine can make you more sensitive to the sun. Keep out of the sun. If you cannot avoid being in the sun, wear protective clothing and use sunscreen. Do not use sun lamps or tanning beds/booths. What side effects may I notice from receiving this medicine? Side effects that you should report to your doctor or health care professional as soon as possible:  allergic reactions like skin rash, itching or hives, swelling of the face, lips, or tongue  low blood counts - this medicine  may decrease the number of white blood cells, red blood cells and platelets. You may be at increased risk for infections and bleeding.  signs of infection - fever or chills, cough, sore throat, pain or difficulty passing urine  signs of decreased platelets or bleeding - bruising, pinpoint red spots on the skin, black, tarry stools, blood in the urine  signs of decreased red blood cells - unusually weak or tired, fainting spells, lightheadedness  breathing problems  changes in vision  chest pain  mouth sores  nausea and vomiting  pain, swelling, redness at site where injected  pain, tingling, numbness in the hands or feet  redness, swelling, or sores on hands or feet  stomach pain  unusual bleeding Side effects that usually do not require medical attention (report to your doctor or health care professional if they continue or are bothersome):  changes in finger or toe nails  diarrhea  dry or itchy skin  hair loss  headache  loss of appetite  sensitivity of eyes to the light  stomach upset  unusually teary eyes This list may not describe all possible side effects. Call your doctor for medical advice about side effects. You may report side effects to FDA at 1-800-FDA-1088. Where should I keep my medicine? This drug is given in a hospital or clinic and will not be stored at home. NOTE: This sheet is a summary. It may not cover all possible information. If you have questions about this medicine, talk to your doctor, pharmacist, or health care provider.  2020 [employer]/Gold Standard (2007-09-04 13:53:16) Leucovorin injection What is this medicine? LEUCOVORIN (loo koe VOR in) is used to prevent or treat the harmful effects of some medicines. This medicine is used to treat anemia caused by a low amount of folic acid in the body. It is also used with 5-fluorouracil (5-FU) to treat colon cancer. This medicine may be used for other purposes; ask your health care provider or  pharmacist if you have questions. What should I tell my health care provider before I take this medicine? They need to know if you have any of these conditions:  anemia from low levels of vitamin B-12 in the blood  an unusual or allergic reaction to leucovorin, folic acid, other medicines, foods, dyes, or preservatives  pregnant or trying to get pregnant  breast-feeding How should I use this medicine? This medicine is for injection into a muscle or into a vein. It is given by a health care professional in a hospital or clinic setting. Talk to your pediatrician regarding the use of this medicine in children. Special care may be needed. Overdosage:  If you think you have taken too much of this medicine contact a poison control center or emergency room at once. NOTE: This medicine is only for you. Do not share this medicine with others. What if I miss a dose? This does not apply. What may interact with this medicine?  capecitabine  fluorouracil  phenobarbital  phenytoin  primidone  trimethoprim-sulfamethoxazole This list may not describe all possible interactions. Give your health care provider a list of all the medicines, herbs, non-prescription drugs, or dietary supplements you use. Also tell them if you smoke, drink alcohol, or use illegal drugs. Some items may interact with your medicine. What should I watch for while using this medicine? Your condition will be monitored carefully while you are receiving this medicine. This medicine may increase the side effects of 5-fluorouracil, 5-FU. Tell your doctor or health care professional if you have diarrhea or mouth sores that do not get better or that get worse. What side effects may I notice from receiving this medicine? Side effects that you should report to your doctor or health care professional as soon as possible:  allergic reactions like skin rash, itching or hives, swelling of the face, lips, or tongue  breathing  problems  fever, infection  mouth sores  unusual bleeding or bruising  unusually weak or tired Side effects that usually do not require medical attention (report to your doctor or health care professional if they continue or are bothersome):  constipation or diarrhea  loss of appetite  nausea, vomiting This list may not describe all possible side effects. Call your doctor for medical advice about side effects. You may report side effects to FDA at 1-800-FDA-1088. Where should I keep my medicine? This drug is given in a hospital or clinic and will not be stored at home. NOTE: This sheet is a summary. It may not cover all possible information. If you have questions about this medicine, talk to your doctor, pharmacist, or health care provider.  2020 [employer] Standard (2007-11-05 16:50:29)   Oxaliplatin Injection What is this medicine? OXALIPLATIN (ox AL i PLA tin) is a chemotherapy drug. It targets fast dividing cells, like cancer cells, and causes these cells to die. This medicine is used to treat cancers of the colon and rectum, and many other cancers. This medicine may be used for other purposes; ask your health care provider or pharmacist if you have questions. COMMON BRAND NAME(S): Eloxatin What should I tell my health care provider before I take this medicine? They need to know if you have any of these conditions:  heart disease  history of irregular heartbeat  liver disease  low blood counts, like white cells, platelets, or red blood cells  lung or breathing disease, like asthma  take medicines that treat or prevent blood clots  tingling of the fingers or toes, or other nerve disorder  an unusual or allergic reaction to oxaliplatin, other chemotherapy, other medicines, foods, dyes, or preservatives  pregnant or trying to get pregnant  breast-feeding How should I use this medicine? This drug is given as an infusion into a vein. It is administered in a  hospital or clinic by a specially trained health care professional. Talk to your pediatrician regarding the use of this medicine in children. Special care may be needed. Overdosage: If you think you have taken too much of this medicine contact a poison control center or emergency room at once. NOTE: This medicine is only for you. Do not share this medicine with others. What  if I miss a dose? It is important not to miss a dose. Call your doctor or health care professional if you are unable to keep an appointment. What may interact with this medicine? Do not take this medicine with any of the following medications:  cisapride  dronedarone  pimozide  thioridazine This medicine may also interact with the following medications:  aspirin and aspirin-like medicines  certain medicines that treat or prevent blood clots like warfarin, apixaban, dabigatran, and rivaroxaban  cisplatin  cyclosporine  diuretics  medicines for infection like acyclovir, adefovir, amphotericin B, bacitracin, cidofovir, foscarnet, ganciclovir, gentamicin, pentamidine, vancomycin  NSAIDs, medicines for pain and inflammation, like ibuprofen or naproxen  other medicines that prolong the QT interval (an abnormal heart rhythm)  pamidronate  zoledronic acid This list may not describe all possible interactions. Give your health care provider a list of all the medicines, herbs, non-prescription drugs, or dietary supplements you use. Also tell them if you smoke, drink alcohol, or use illegal drugs. Some items may interact with your medicine. What should I watch for while using this medicine? Your condition will be monitored carefully while you are receiving this medicine. You may need blood work done while you are taking this medicine. This medicine may make you feel generally unwell. This is not uncommon as chemotherapy can affect healthy cells as well as cancer cells. Report any side effects. Continue your course of  treatment even though you feel ill unless your healthcare professional tells you to stop. This medicine can make you more sensitive to cold. Do not drink cold drinks or use ice. Cover exposed skin before coming in contact with cold temperatures or cold objects. When out in cold weather wear warm clothing and cover your mouth and nose to warm the air that goes into your lungs. Tell your doctor if you get sensitive to the cold. Do not become pregnant while taking this medicine or for 9 months after stopping it. Women should inform their health care professional if they wish to become pregnant or think they might be pregnant. Men should not father a child while taking this medicine and for 6 months after stopping it. There is potential for serious side effects to an unborn child. Talk to your health care professional for more information. Do not breast-feed a child while taking this medicine or for 3 months after stopping it. This medicine has caused ovarian failure in some women. This medicine may make it more difficult to get pregnant. Talk to your health care professional if you are concerned about your fertility. This medicine has caused decreased sperm counts in some men. This may make it more difficult to father a child. Talk to your health care professional if you are concerned about your fertility. This medicine may increase your risk of getting an infection. Call your health care professional for advice if you get a fever, chills, or sore throat, or other symptoms of a cold or flu. Do not treat yourself. Try to avoid being around people who are sick. Avoid taking medicines that contain aspirin, acetaminophen, ibuprofen, naproxen, or ketoprofen unless instructed by your health care professional. These medicines may hide a fever. Be careful brushing or flossing your teeth or using a toothpick because you may get an infection or bleed more easily. If you have any dental work done, tell your dentist you  are receiving this medicine. What side effects may I notice from receiving this medicine? Side effects that you should report to your doctor or  health care professional as soon as possible:  allergic reactions like skin rash, itching or hives, swelling of the face, lips, or tongue  breathing problems  cough  low blood counts - this medicine may decrease the number of white blood cells, red blood cells, and platelets. You may be at increased risk for infections and bleeding  nausea, vomiting  pain, redness, or irritation at site where injected  pain, tingling, numbness in the hands or feet  signs and symptoms of bleeding such as bloody or black, tarry stools; red or dark brown urine; spitting up blood or brown material that looks like coffee grounds; red spots on the skin; unusual bruising or bleeding from the eyes, gums, or nose  signs and symptoms of a dangerous change in heartbeat or heart rhythm like chest pain; dizziness; fast, irregular heartbeat; palpitations; feeling faint or lightheaded; falls  signs and symptoms of infection like fever; chills; cough; sore throat; pain or trouble passing urine  signs and symptoms of liver injury like dark yellow or brown urine; general ill feeling or flu-like symptoms; light-colored stools; loss of appetite; nausea; right upper belly pain; unusually weak or tired; yellowing of the eyes or skin  signs and symptoms of low red blood cells or anemia such as unusually weak or tired; feeling faint or lightheaded; falls  signs and symptoms of muscle injury like dark urine; trouble passing urine or change in the amount of urine; unusually weak or tired; muscle pain; back pain Side effects that usually do not require medical attention (report to your doctor or health care professional if they continue or are bothersome):  changes in taste  diarrhea  gas  hair loss  loss of appetite  mouth sores This list may not describe all possible side  effects. Call your doctor for medical advice about side effects. You may report side effects to FDA at 1-800-FDA-1088. Where should I keep my medicine? This drug is given in a hospital or clinic and will not be stored at home. NOTE: This sheet is a summary. It may not cover all possible information. If you have questions about this medicine, talk to your doctor, pharmacist, or health care provider.  2020 [employer] Standard (2018-09-18 12:20:35)

## 2019-06-06 NOTE — Progress Notes (Signed)
Patient on plan of care prior to [facility]. 

## 2019-06-06 NOTE — Progress Notes (Signed)
Pt arrived to MD appt in [facility]. Denies any new concerns or changes since completing pre assessment yesterday.

## 2019-06-07 NOTE — Progress Notes (Signed)
START ON PATHWAY REGIMEN - Colorectal     A cycle is every 14 days:     Oxaliplatin      Leucovorin      Fluorouracil      Fluorouracil   **Always confirm dose/schedule in your pharmacy ordering system**  Patient Characteristics: Distant Metastases, Nonsurgical Candidate, KRAS/NRAS Mutation Positive/Unknown (BRAF V600 Wild-Type/Unknown), Standard Cytotoxic Therapy, First Line Standard Cytotoxic Therapy, Bevacizumab Ineligible, PS = 0,1 Tumor Location: Colon Therapeutic Status: Distant Metastases Microsatellite/Mismatch Repair Status: Unknown BRAF Mutation Status: Wild-Type (no mutation) KRAS/NRAS Mutation Status: Mutation Positive Standard Cytotoxic Line of Therapy: First Line Standard Cytotoxic Therapy ECOG Performance Status: 0 Bevacizumab Eligibility: Ineligible Intent of Therapy: Non-Curative / Palliative Intent, Discussed with Patient

## 2019-06-09 ENCOUNTER — Encounter
Admission: RE | Admit: 2019-06-09 | Discharge: 2019-06-09 | Disposition: A | Discharge status: Home / Self Care | Payer: Medicare Other | Source: Ambulatory Visit | Attending: Surgery | Admitting: Surgery

## 2019-06-09 ENCOUNTER — Encounter: Payer: Self-pay | Admitting: Surgery

## 2019-06-09 ENCOUNTER — Other Ambulatory Visit: Payer: Self-pay

## 2019-06-09 ENCOUNTER — Inpatient Hospital Stay: Payer: Medicare Other

## 2019-06-09 ENCOUNTER — Inpatient Hospital Stay (HOSPITAL_BASED_OUTPATIENT_CLINIC_OR_DEPARTMENT_OTHER): Payer: Medicare Other | Admitting: Nurse Practitioner

## 2019-06-09 ENCOUNTER — Ambulatory Visit: Payer: Self-pay | Admitting: Surgery

## 2019-06-09 DIAGNOSIS — Z85038 Personal history of other malignant neoplasm of large intestine: Secondary | ICD-10-CM

## 2019-06-09 DIAGNOSIS — C786 Secondary malignant neoplasm of retroperitoneum and peritoneum: Secondary | ICD-10-CM

## 2019-06-09 MED ORDER — ONDANSETRON HCL 8 MG PO TABS: 8.0000 mg | ORAL_TABLET | Freq: Two times a day (BID) | ORAL | 1 refills | Status: DC | PRN

## 2019-06-09 MED ORDER — LIDOCAINE-PRILOCAINE 2.5-2.5 % EX CREA: TOPICAL_CREAM | CUTANEOUS | 3 refills | Status: DC

## 2019-06-09 NOTE — H&P (Signed)
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 male who was referred by Mike Gip for evaluation of above.  Hx of colon cancer resection few years ago, noted to have increasing CEA levels, prompting further workup that noted new mass near pancreas, endoscopic US guided biopsy confirmed colon CA mets.  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  Family History: family history includes Breast cancer in his mother; COPD in his father; Cancer in his mother.  Social History:  reports that he has never smoked. He has never used smokeless tobacco. He reports current alcohol use. He reports that he does not use drugs.  Current Medications: has a current medication list which includes the following prescription(s): amlodipine, aspirin, ferrous sulfate, glucosam sul na/chondr, l gasseri/b bifidum/b longum, olmesartan, and sodium, potassium, and magnesium.  Allergies:  No Known Allergies  ROS:  A 15 point review of systems was performed and pertinent positives and negatives noted in HPI   Objective:     BP 175/82   Pulse 69   Ht 177.8 cm (5\' 10" )   Wt (!) 126.6 kg (279 lb)   BMI 40.03 kg/m   Constitutional :  alert, appears stated age, cooperative and no distress  Lymphatics/Throat:  no asymmetry, masses, or scars  Respiratory:  clear to auscultation  bilaterally  Cardiovascular:  regular rate and rhythm  Gastrointestinal: soft, non-tender; bowel sounds normal; no masses,  no organomegaly.    Musculoskeletal: Steady gait and movement  Skin: Cool and moist  Psychiatric: Normal affect, non-agitated, not confused       LABS:  CYTOLOGY - NON GYN CYTOLOGY - NON PAP  Reason for Addendum #1: Clerical  Specimen Submitted: A. Peripancreatic mass; FNA  Clinical History: Peripancreatic mass      DIAGNOSIS: A. PERIPANCREATIC MASS; EUS-GUIDED FINE NEEDLE ASPIRATION BIOPSY: - DIAGNOSTIC OF MALIGNANCY. - ADENOCARCINOMA; SEE COMMENT.  Comment: The tumor cells are positive for CK20 and CDX2. They are negative for CK7. In the proper clinical context, the findings are compatible with a metastatic lesion of colorectal origin. Clinical correlation is recommended.  GROSS DESCRIPTION: A. Site: Peripancreatic mass Procedure: Endoscopic ultrasound Cytology specimen: FNA/needle biopsy and rinse Cytotechnologist(s): Molli Barrows and Vance Peper  Specimen material collected and submitted for:  1 Diff Quik stained slides  1 Pap stained slides Specimen material submitted for: Cell block and ThinPrep  Specimen description: Fixative: CytoLyt Volume: 15 mL Color: Colorless Transparency: Clear Tissue fragments present: Yes Collection brush(es) present: No   Final Diagnosis performed by Betsy Pries, MD.  Electronically signed 04/21/2019 12:54:42PM The electronic signature indicates that the named Attending Pathologist has evaluated the specimen Technical component performed at [facility], [address], [place], [place] [address] Lab: [phone] Dir: Rush Farmer, MD, MMM  Professional component performed at [facility], [facility], [place], [place], [place] [address] Lab: [phone] Dir: Dellia Nims. Reuel Derby, MD  ADDENDUM: IHC slides were prepared by [facility]  [facility], [place], [place]. All controls stained appropriately.  This test was developed and its performance characteristics determined by [facility]. It has not been cleared or approved by the [place] Food and Drug Administration. The FDA does not require this test to go through premarket FDA review. This test is used for clinical purposes. It should not be regarded as investigational or for research. This laboratory is certified under the Clinical Laboratory Improvement Amendments (CLIA) as qualified to perform high complexity clinical laboratory testing.        Addendum #1 performed by Betsy Pries, MD.  Electronically signed 04/21/2019 1:04:53PM The electronic signature indicates that the named Attending Pathologist has evaluated the specimen Technical component performed at [facility], [address] [place], [place] [address] Lab: [phone] Dir: Rush Farmer, MD, MMM  Professional component performed at [facility], [facility], [place], [place], [place] [address] Lab: [phone] Dir: Dellia Nims. Rubinas, MD      RADS: CLINICAL DATA: Initial treatment strategy for stage I colon cancer. Elevated CA. Transverse colonic resection.  EXAM: NUCLEAR MEDICINE PET SKULL BASE TO THIGH  TECHNIQUE: 15.3 mCi F-18 FDG was injected intravenously. Full-ring PET imaging was performed from the skull base to thigh after the radiotracer. CT data was obtained and used for attenuation correction and anatomic localization.  Fasting blood glucose: 93 mg/dl  COMPARISON: Chest abdomen and pelvic CTs of 12/20/2018  FINDINGS: Mild degradation secondary to patient body habitus.  Mediastinal blood pool activity: SUV max 3.3  Liver activity: SUV max NA  NECK: No areas of abnormal hypermetabolism.  Incidental CT findings: Multiple small jugular nodes, none pathologic by size criteria.  CHEST: No pulmonary parenchymal or thoracic nodal hypermetabolism.  Incidental CT  findings: Mild cardiomegaly. Right subclavian artery traverses posterior to the esophagus. Aortic and coronary artery atherosclerosis.  ABDOMEN/PELVIS: Hypermetabolic soft tissue density lesion is positioned just inferior and anterior to the pancreatic neck. Measures 2.4 x 2.3 cm and a S.U.V. max of 11.0 on 145/3.  No other parenchymal or nodal hypermetabolism identified within the abdomen or pelvis.  Incidental CT findings: Hepatic steatosis. Normal adrenal glands. Lower pole right renal cyst. Left lumbar hernia or laxity in the setting of subjacent left renal surgical changes. Surgical changes in the transverse colon. Mild prostatomegaly.  SKELETON: No abnormal marrow activity.  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  IMPRESSION: 1. Hypermetabolic soft tissue density caudal and anterior to the pancreatic neck. Favored to represent isolated peritoneal or nodal metastasis in the setting of prior transverse colonic resection (expected primary drainage). Although this is immediately adjacent to the pancreas, a fat plane is maintained, arguing strongly against a pancreatic primary. 2. Otherwise, no evidence of hypermetabolic metastasis. 3. Mild degradation secondary to patient body habitus. 4. Aortic Atherosclerosis (ICD10-I70.0).   Electronically Signed  By: Abigail Miyamoto M.D.  On: 04/02/2019 14:10  Other Result Information  Interface, Rad Results In - 04/02/2019  2:13 PM EST CLINICAL DATA:  Initial treatment strategy for stage I colon cancer. Elevated CA. Transverse colonic resection.  EXAM: NUCLEAR MEDICINE PET SKULL BASE TO THIGH  TECHNIQUE: 15.3 mCi F-18 FDG was injected intravenously. Full-ring PET imaging was performed from the skull base to thigh after the radiotracer. CT data was obtained and used for attenuation correction and anatomic localization.  Fasting blood glucose: 93 mg/dl  COMPARISON:  Chest abdomen and  pelvic CTs of 12/20/2018  FINDINGS: Mild degradation secondary to patient body habitus.  Mediastinal blood pool activity: SUV max 3.3  Liver activity: SUV max NA  NECK: No  areas of abnormal hypermetabolism.  Incidental CT findings: Multiple small jugular nodes, none pathologic by size criteria.  CHEST: No pulmonary parenchymal or thoracic nodal hypermetabolism.  Incidental CT findings: Mild cardiomegaly. Right subclavian artery traverses posterior to the esophagus. Aortic and coronary artery atherosclerosis.  ABDOMEN/PELVIS: Hypermetabolic soft tissue density lesion is positioned just inferior and anterior to the pancreatic neck. Measures 2.4 x 2.3 cm and a S.U.V. max of 11.0 on 145/3.  No other parenchymal or nodal hypermetabolism identified within the abdomen or pelvis.  Incidental CT findings: Hepatic steatosis. Normal adrenal glands. Lower pole right renal cyst. Left lumbar hernia or laxity in the setting of subjacent left renal surgical changes. Surgical changes in the transverse colon. Mild prostatomegaly.  SKELETON: No abnormal marrow activity.  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  IMPRESSION: 1. Hypermetabolic soft tissue density caudal and anterior to the pancreatic neck. Favored to represent isolated peritoneal or nodal metastasis in the setting of prior transverse colonic resection (expected primary drainage). Although this is immediately adjacent to the pancreas, a fat plane is maintained, arguing strongly against a pancreatic primary. 2. Otherwise, no evidence of hypermetabolic metastasis. 3. Mild degradation secondary to patient body habitus. 4.  Aortic Atherosclerosis (ICD10-I70.0).   Electronically Signed   By: Abigail Miyamoto M.D.   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 Malignant neoplasm of transverse colon (CMS-HCC)  [C18.4]  Risk alternative benefits discussed.  Risks include bleeding, infection, dislocation, migration, malfunction, unsuccessful placement, pneumothorax, and additional procedures to address that risks.  Benefits include initiation of chemotherapy.  Alternative includes non-IV infusion therapy.  We discussed the need for the port to infuse IV chemotherapy agents, and how the port can be a temporary and/or permanent option depending on patient preference after completion of chemotherapy.  Port will be okay to use as soon as it is placed.  Patient and wife verbalized understanding and all questions and concerns addressed.

## 2019-06-09 NOTE — Progress Notes (Signed)
Patient did not show for virtual appt. Left message to return call. Will send message to have patient rescheduled.

## 2019-06-09 NOTE — Patient Instructions (Signed)
Your procedure is scheduled on: Friday, January 29 Report to Day Surgery on the 2nd floor of the [facility]. To find out your arrival time, please call [phone] between 1PM - 3PM on: Thursday, January 28  REMEMBER: Instructions that are not followed completely may result in serious medical risk, up to and including death; or upon the discretion of your surgeon and anesthesiologist your surgery may need to be rescheduled.  Do not eat food after midnight the night before surgery.  No gum chewing, lozengers or hard candies.  You may however, drink CLEAR liquids up to 2 hours before you are scheduled to arrive for your surgery. Do not drink anything within 2 hours of the start of your surgery.  Clear liquids include: - water  - apple juice without pulp - gatorade - black coffee or tea (Do NOT add milk or creamers to the coffee or tea) Do NOT drink anything that is not on this list.  No Alcohol for 24 hours before or after surgery.  No Smoking including e-cigarettes for 24 hours prior to surgery.  No chewable tobacco products for at least 6 hours prior to surgery.  No nicotine patches on the day of surgery.  On the morning of surgery brush your teeth with toothpaste and water, you may rinse your mouth with mouthwash if you wish. Do not swallow any toothpaste or mouthwash.  Notify your doctor if there is any change in your medical condition (cold, fever, infection).  Do not wear jewelry, make-up, hairpins, clips or nail polish.  Do not wear lotions, powders, or perfumes.   Do not shave 48 hours prior to surgery.   Contacts and dentures may not be worn into surgery.  Do not bring valuables to the hospital, including drivers license, insurance or credit cards.  [facility] is not responsible for any belongings or valuables.   TAKE THESE MEDICATIONS THE MORNING OF SURGERY:  1.  Amlodipine  Use CHG Soap as directed on instruction sheet.  Stop Anti-inflammatories (NSAIDS)  such as Advil, Aleve, Ibuprofen, Motrin, Naproxen, Naprosyn and Aspirin based products such as Excedrin, Goodys Powder, BC Powder. (May take Tylenol or Acetaminophen if needed.)  Stop ANY OVER THE COUNTER supplements until after surgery. (May continue Vitamin D, Vitamin B, and multivitamin.)  Wear comfortable clothing (specific to your surgery type) to the hospital.  If you are being discharged the day of surgery, you will not be allowed to drive home. You will need a responsible adult to drive you home and stay with you that night.   If you are taking public transportation, you will need to have a responsible adult with you. Please confirm with your physician that it is acceptable to use public transportation.   Please call [phone] if you have any questions about these instructions.

## 2019-06-09 NOTE — H&P (View-Only) (Signed)
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 male who was referred by Mike Gip for evaluation of above.  Hx of colon cancer resection few years ago, noted to have increasing CEA levels, prompting further workup that noted new mass near pancreas, endoscopic US guided biopsy confirmed colon CA mets.  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  Family History: family history includes Breast cancer in his mother; COPD in his father; Cancer in his mother.  Social History:  reports that he has never smoked. He has never used smokeless tobacco. He reports current alcohol use. He reports that he does not use drugs.  Current Medications: has a current medication list which includes the following prescription(s): amlodipine, aspirin, ferrous sulfate, glucosam sul na/chondr, l gasseri/b bifidum/b longum, olmesartan, and sodium, potassium, and magnesium.  Allergies:  No Known Allergies  ROS:  A 15 point review of systems was performed and pertinent positives and negatives noted in HPI   Objective:     BP 175/82   Pulse 69   Ht 177.8 cm (5\' 10" )   Wt (!) 126.6 kg (279 lb)   BMI 40.03 kg/m   Constitutional :  alert, appears stated age, cooperative and no distress  Lymphatics/Throat:  no asymmetry, masses, or scars  Respiratory:  clear to auscultation  bilaterally  Cardiovascular:  regular rate and rhythm  Gastrointestinal: soft, non-tender; bowel sounds normal; no masses,  no organomegaly.    Musculoskeletal: Steady gait and movement  Skin: Cool and moist  Psychiatric: Normal affect, non-agitated, not confused       LABS:  CYTOLOGY - NON GYN CYTOLOGY - NON PAP  Reason for Addendum #1: Clerical  Specimen Submitted: A. Peripancreatic mass; FNA  Clinical History: Peripancreatic mass      DIAGNOSIS: A. PERIPANCREATIC MASS; EUS-GUIDED FINE NEEDLE ASPIRATION BIOPSY: - DIAGNOSTIC OF MALIGNANCY. - ADENOCARCINOMA; SEE COMMENT.  Comment: The tumor cells are positive for CK20 and CDX2. They are negative for CK7. In the proper clinical context, the findings are compatible with a metastatic lesion of colorectal origin. Clinical correlation is recommended.  GROSS DESCRIPTION: A. Site: Peripancreatic mass Procedure: Endoscopic ultrasound Cytology specimen: FNA/needle biopsy and rinse Cytotechnologist(s): Molli Barrows and Vance Peper  Specimen material collected and submitted for:  1 Diff Quik stained slides  1 Pap stained slides Specimen material submitted for: Cell block and ThinPrep  Specimen description: Fixative: CytoLyt Volume: 15 mL Color: Colorless Transparency: Clear Tissue fragments present: Yes Collection brush(es) present: No   Final Diagnosis performed by Betsy Pries, MD.  Electronically signed 04/21/2019 12:54:42PM The electronic signature indicates that the named Attending Pathologist has evaluated the specimen Technical component performed at [facility], [address] [place], [place] [address] Lab: [phone] Dir: Rush Farmer, MD, MMM  Professional component performed at [facility], [facility], [place], [place], [place] [address] Lab: [phone] Dir: Dellia Nims. Reuel Derby, MD  ADDENDUM: IHC slides were prepared by [facility], RT[facility], No Name. All controls stained appropriately.  This test was developed and its performance characteristics determined by [facility]. It has not been cleared or approved by the [place] Food and Drug Administration. The FDA does not require this test to go through premarket FDA review. This test is used for clinical purposes. It should not be regarded as investigational or for research. This laboratory is certified under the Clinical Laboratory Improvement Amendments (CLIA) as qualified to perform high complexity clinical laboratory testing.        Addendum #1 performed by Betsy Pries, MD.  Electronically signed 04/21/2019 1:04:53PM The electronic signature indicates that the named Attending Pathologist has evaluated the specimen Technical component performed at [facility], [address] [place], [place] [address] Lab: [phone] Dir: Rush Farmer, MD, MMM  Professional component performed at [facility], [facility], [place], [place], [place] [address] Lab: [phone] Dir: Dellia Nims. Rubinas, MD      RADS: CLINICAL DATA: Initial treatment strategy for stage I colon cancer. Elevated CA. Transverse colonic resection.  EXAM: NUCLEAR MEDICINE PET SKULL BASE TO THIGH  TECHNIQUE: 15.3 mCi F-18 FDG was injected intravenously. Full-ring PET imaging was performed from the skull base to thigh after the radiotracer. CT data was obtained and used for attenuation correction and anatomic localization.  Fasting blood glucose: 93 mg/dl  COMPARISON: Chest abdomen and pelvic CTs of 12/20/2018  FINDINGS: Mild degradation secondary to patient body habitus.  Mediastinal blood pool activity: SUV max 3.3  Liver activity: SUV max NA  NECK: No areas of abnormal hypermetabolism.  Incidental CT findings: Multiple small jugular nodes, none pathologic by size criteria.  CHEST: No pulmonary parenchymal or thoracic nodal hypermetabolism.  Incidental CT  findings: Mild cardiomegaly. Right subclavian artery traverses posterior to the esophagus. Aortic and coronary artery atherosclerosis.  ABDOMEN/PELVIS: Hypermetabolic soft tissue density lesion is positioned just inferior and anterior to the pancreatic neck. Measures 2.4 x 2.3 cm and a S.U.V. max of 11.0 on 145/3.  No other parenchymal or nodal hypermetabolism identified within the abdomen or pelvis.  Incidental CT findings: Hepatic steatosis. Normal adrenal glands. Lower pole right renal cyst. Left lumbar hernia or laxity in the setting of subjacent left renal surgical changes. Surgical changes in the transverse colon. Mild prostatomegaly.  SKELETON: No abnormal marrow activity.  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  IMPRESSION: 1. Hypermetabolic soft tissue density caudal and anterior to the pancreatic neck. Favored to represent isolated peritoneal or nodal metastasis in the setting of prior transverse colonic resection (expected primary drainage). Although this is immediately adjacent to the pancreas, a fat plane is maintained, arguing strongly against a pancreatic primary. 2. Otherwise, no evidence of hypermetabolic metastasis. 3. Mild degradation secondary to patient body habitus. 4. Aortic Atherosclerosis (ICD10-I70.0).   Electronically Signed  By: Abigail Miyamoto M.D.  On: 04/02/2019 14:10  Other Result Information  Interface, Rad Results In - 04/02/2019  2:13 PM EST CLINICAL DATA:  Initial treatment strategy for stage I colon cancer. Elevated CA. Transverse colonic resection.  EXAM: NUCLEAR MEDICINE PET SKULL BASE TO THIGH  TECHNIQUE: 15.3 mCi F-18 FDG was injected intravenously. Full-ring PET imaging was performed from the skull base to thigh after the radiotracer. CT data was obtained and used for attenuation correction and anatomic localization.  Fasting blood glucose: 93 mg/dl  COMPARISON:  Chest abdomen and  pelvic CTs of 12/20/2018  FINDINGS: Mild degradation secondary to patient body habitus.  Mediastinal blood pool activity: SUV max 3.3  Liver activity: SUV max NA  NECK: No  areas of abnormal hypermetabolism.  Incidental CT findings: Multiple small jugular nodes, none pathologic by size criteria.  CHEST: No pulmonary parenchymal or thoracic nodal hypermetabolism.  Incidental CT findings: Mild cardiomegaly. Right subclavian artery traverses posterior to the esophagus. Aortic and coronary artery atherosclerosis.  ABDOMEN/PELVIS: Hypermetabolic soft tissue density lesion is positioned just inferior and anterior to the pancreatic neck. Measures 2.4 x 2.3 cm and a S.U.V. max of 11.0 on 145/3.  No other parenchymal or nodal hypermetabolism identified within the abdomen or pelvis.  Incidental CT findings: Hepatic steatosis. Normal adrenal glands. Lower pole right renal cyst. Left lumbar hernia or laxity in the setting of subjacent left renal surgical changes. Surgical changes in the transverse colon. Mild prostatomegaly.  SKELETON: No abnormal marrow activity.  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  IMPRESSION: 1. Hypermetabolic soft tissue density caudal and anterior to the pancreatic neck. Favored to represent isolated peritoneal or nodal metastasis in the setting of prior transverse colonic resection (expected primary drainage). Although this is immediately adjacent to the pancreas, a fat plane is maintained, arguing strongly against a pancreatic primary. 2. Otherwise, no evidence of hypermetabolic metastasis. 3. Mild degradation secondary to patient body habitus. 4.  Aortic Atherosclerosis (ICD10-I70.0).   Electronically Signed   By: Abigail Miyamoto M.D.   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 Malignant neoplasm of transverse colon (CMS-HCC)  [C18.4]  Risk alternative benefits discussed.  Risks include bleeding, infection, dislocation, migration, malfunction, unsuccessful placement, pneumothorax, and additional procedures to address that risks.  Benefits include initiation of chemotherapy.  Alternative includes non-IV infusion therapy.  We discussed the need for the port to infuse IV chemotherapy agents, and how the port can be a temporary and/or permanent option depending on patient preference after completion of chemotherapy.  Port will be okay to use as soon as it is placed.  Patient and wife verbalized understanding and all questions and concerns addressed.

## 2019-06-11 ENCOUNTER — Other Ambulatory Visit: Payer: Medicare Other

## 2019-06-11 ENCOUNTER — Encounter
Admission: RE | Admit: 2019-06-11 | Discharge: 2019-06-11 | Disposition: A | Discharge status: Home / Self Care | Payer: Medicare Other | Source: Ambulatory Visit | Attending: Surgery | Admitting: Surgery

## 2019-06-11 ENCOUNTER — Other Ambulatory Visit: Payer: Self-pay

## 2019-06-11 DIAGNOSIS — Z20822 Contact with and (suspected) exposure to covid-19: Secondary | ICD-10-CM | POA: Diagnosis not present

## 2019-06-11 DIAGNOSIS — Z01818 Encounter for other preprocedural examination: Secondary | ICD-10-CM | POA: Insufficient documentation

## 2019-06-11 DIAGNOSIS — I10 Essential (primary) hypertension: Secondary | ICD-10-CM | POA: Insufficient documentation

## 2019-06-11 LAB — SARS CORONAVIRUS 2 (TAT 6-24 HRS): SARS Coronavirus 2: NEGATIVE

## 2019-06-12 MED ORDER — CEFAZOLIN SODIUM-DEXTROSE 2-4 GM/100ML-% IV SOLN
2.0000 g | INTRAVENOUS | Status: AC
  Administered 2019-06-13: 2 g via INTRAVENOUS

## 2019-06-13 ENCOUNTER — Ambulatory Visit: Payer: Medicare Other | Admitting: Anesthesiology

## 2019-06-13 ENCOUNTER — Encounter: Payer: Self-pay | Admitting: Surgery

## 2019-06-13 ENCOUNTER — Ambulatory Visit: Payer: Medicare Other

## 2019-06-13 ENCOUNTER — Ambulatory Visit
Admission: RE | Admit: 2019-06-13 | Discharge: 2019-06-13 | Disposition: A | Discharge status: Home / Self Care | Payer: Medicare Other | Source: Ambulatory Visit | Attending: Surgery | Admitting: Surgery

## 2019-06-13 ENCOUNTER — Other Ambulatory Visit: Payer: Self-pay

## 2019-06-13 ENCOUNTER — Encounter
Admission: RE | Disposition: A | Discharge status: Home / Self Care | Payer: Self-pay | Source: Ambulatory Visit | Attending: Surgery

## 2019-06-13 DIAGNOSIS — M1990 Unspecified osteoarthritis, unspecified site: Secondary | ICD-10-CM | POA: Diagnosis not present

## 2019-06-13 DIAGNOSIS — Z803 Family history of malignant neoplasm of breast: Secondary | ICD-10-CM | POA: Insufficient documentation

## 2019-06-13 DIAGNOSIS — C184 Malignant neoplasm of transverse colon: Secondary | ICD-10-CM | POA: Diagnosis present

## 2019-06-13 DIAGNOSIS — E669 Obesity, unspecified: Secondary | ICD-10-CM | POA: Diagnosis not present

## 2019-06-13 DIAGNOSIS — Z95828 Presence of other vascular implants and grafts: Secondary | ICD-10-CM

## 2019-06-13 DIAGNOSIS — C786 Secondary malignant neoplasm of retroperitoneum and peritoneum: Secondary | ICD-10-CM

## 2019-06-13 DIAGNOSIS — Z809 Family history of malignant neoplasm, unspecified: Secondary | ICD-10-CM | POA: Diagnosis not present

## 2019-06-13 DIAGNOSIS — Z6841 Body Mass Index (BMI) 40.0 and over, adult: Secondary | ICD-10-CM | POA: Diagnosis not present

## 2019-06-13 DIAGNOSIS — Z9049 Acquired absence of other specified parts of digestive tract: Secondary | ICD-10-CM | POA: Insufficient documentation

## 2019-06-13 DIAGNOSIS — I10 Essential (primary) hypertension: Secondary | ICD-10-CM | POA: Insufficient documentation

## 2019-06-13 HISTORY — PX: PORTACATH PLACEMENT: SHX2246

## 2019-06-13 SURGERY — INSERTION, TUNNELED CENTRAL VENOUS DEVICE, WITH PORT
Anesthesia: General | Site: Chest | Laterality: Right

## 2019-06-13 MED ORDER — CEFAZOLIN SODIUM-DEXTROSE 2-4 GM/100ML-% IV SOLN
INTRAVENOUS | Status: AC
  Filled 2019-06-13: qty 100

## 2019-06-13 MED ORDER — FAMOTIDINE 20 MG PO TABS: 20.0000 mg | ORAL_TABLET | Freq: Once | ORAL | Status: AC

## 2019-06-13 MED ORDER — PHENYLEPHRINE HCL (PRESSORS) 10 MG/ML IV SOLN
INTRAVENOUS | Status: DC | PRN
  Administered 2019-06-13: 50 ug via INTRAVENOUS
  Administered 2019-06-13: 100 ug via INTRAVENOUS

## 2019-06-13 MED ORDER — HEPARIN SOD (PORK) LOCK FLUSH 100 UNIT/ML IV SOLN
INTRAVENOUS | Status: DC | PRN
  Administered 2019-06-13: 500 [IU] via INTRAVENOUS

## 2019-06-13 MED ORDER — BUPIVACAINE HCL (PF) 0.5 % IJ SOLN
INTRAMUSCULAR | Status: AC
  Filled 2019-06-13: qty 30

## 2019-06-13 MED ORDER — EPHEDRINE SULFATE 50 MG/ML IJ SOLN
INTRAMUSCULAR | Status: AC
  Filled 2019-06-13: qty 1

## 2019-06-13 MED ORDER — LIDOCAINE-EPINEPHRINE 1 %-1:100000 IJ SOLN
INTRAMUSCULAR | Status: AC
  Filled 2019-06-13: qty 1

## 2019-06-13 MED ORDER — FAMOTIDINE 20 MG PO TABS
ORAL_TABLET | ORAL | Status: AC
  Administered 2019-06-13: 20 mg via ORAL
  Filled 2019-06-13: qty 1

## 2019-06-13 MED ORDER — LIDOCAINE HCL 1 % IJ SOLN
INTRAMUSCULAR | Status: DC | PRN
  Administered 2019-06-13: 3 mL via INTRAMUSCULAR

## 2019-06-13 MED ORDER — LIDOCAINE HCL (PF) 2 % IJ SOLN
INTRAMUSCULAR | Status: AC
  Filled 2019-06-13: qty 10

## 2019-06-13 MED ORDER — ONDANSETRON HCL 4 MG/2ML IJ SOLN: 4.0000 mg | Freq: Once | INTRAMUSCULAR | Status: DC | PRN

## 2019-06-13 MED ORDER — PROPOFOL 500 MG/50ML IV EMUL
INTRAVENOUS | Status: DC | PRN
  Administered 2019-06-13: 20 mg via INTRAVENOUS
  Administered 2019-06-13: 75 ug/kg/min via INTRAVENOUS

## 2019-06-13 MED ORDER — FENTANYL CITRATE (PF) 100 MCG/2ML IJ SOLN
INTRAMUSCULAR | Status: DC | PRN
  Administered 2019-06-13 (×4): 25 ug via INTRAVENOUS

## 2019-06-13 MED ORDER — EPHEDRINE SULFATE 50 MG/ML IJ SOLN
INTRAMUSCULAR | Status: DC | PRN
  Administered 2019-06-13: 5 mg via INTRAVENOUS
  Administered 2019-06-13 (×3): 10 mg via INTRAVENOUS
  Administered 2019-06-13: 5 mg via INTRAVENOUS
  Administered 2019-06-13: 10 mg via INTRAVENOUS

## 2019-06-13 MED ORDER — LACTATED RINGERS IV SOLN: INTRAVENOUS | Status: DC | PRN

## 2019-06-13 MED ORDER — HEPARIN SOD (PORK) LOCK FLUSH 100 UNIT/ML IV SOLN
INTRAVENOUS | Status: AC
  Filled 2019-06-13: qty 5

## 2019-06-13 MED ORDER — DOCUSATE SODIUM 100 MG PO CAPS: 100.0000 mg | ORAL_CAPSULE | Freq: Two times a day (BID) | ORAL | 0 refills | Status: AC | PRN

## 2019-06-13 MED ORDER — FENTANYL CITRATE (PF) 100 MCG/2ML IJ SOLN
INTRAMUSCULAR | Status: AC
  Filled 2019-06-13: qty 2

## 2019-06-13 MED ORDER — HYDROCODONE-ACETAMINOPHEN 5-325 MG PO TABS: 1.0000 | ORAL_TABLET | Freq: Four times a day (QID) | ORAL | 0 refills | Status: DC | PRN

## 2019-06-13 MED ORDER — PHENYLEPHRINE HCL (PRESSORS) 10 MG/ML IV SOLN
INTRAVENOUS | Status: AC
  Filled 2019-06-13: qty 1

## 2019-06-13 MED ORDER — CHLORHEXIDINE GLUCONATE CLOTH 2 % EX PADS
6.0000 | MEDICATED_PAD | Freq: Once | CUTANEOUS | Status: AC
  Administered 2019-06-13: 05:00:00 6 via TOPICAL

## 2019-06-13 MED ORDER — SUCCINYLCHOLINE CHLORIDE 20 MG/ML IJ SOLN
INTRAMUSCULAR | Status: AC
  Filled 2019-06-13: qty 1

## 2019-06-13 MED ORDER — LIDOCAINE HCL (CARDIAC) PF 100 MG/5ML IV SOSY
PREFILLED_SYRINGE | INTRAVENOUS | Status: DC | PRN
  Administered 2019-06-13: 40 mg via INTRAVENOUS

## 2019-06-13 MED ORDER — LACTATED RINGERS IV SOLN: INTRAVENOUS | Status: DC

## 2019-06-13 MED ORDER — FENTANYL CITRATE (PF) 100 MCG/2ML IJ SOLN: 25.0000 ug | INTRAMUSCULAR | Status: DC | PRN

## 2019-06-13 MED ORDER — PROPOFOL 10 MG/ML IV BOLUS
INTRAVENOUS | Status: AC
  Filled 2019-06-13: qty 20

## 2019-06-13 MED ORDER — PROPOFOL 500 MG/50ML IV EMUL
INTRAVENOUS | Status: AC
  Filled 2019-06-13: qty 50

## 2019-06-13 MED ORDER — ACETAMINOPHEN 325 MG PO TABS: 650.0000 mg | ORAL_TABLET | Freq: Three times a day (TID) | ORAL | 0 refills | Status: AC | PRN

## 2019-06-13 MED ORDER — IBUPROFEN 800 MG PO TABS: 800.0000 mg | ORAL_TABLET | Freq: Three times a day (TID) | ORAL | 0 refills | Status: DC | PRN

## 2019-06-13 SURGICAL SUPPLY — 38 items
BAG DECANTER FOR FLEXI CONT (MISCELLANEOUS) ×3 IMPLANT
BENZOIN TINCTURE PRP APPL 2/3 (GAUZE/BANDAGES/DRESSINGS) ×3 IMPLANT
BLADE CLIPPER SURG (BLADE) ×2 IMPLANT
BLADE SURG SZ11 CARB STEEL (BLADE) ×3 IMPLANT
BOOT SUTURE AID YELLOW STND (SUTURE) ×3 IMPLANT
CANISTER SUCT 1200ML W/VALVE (MISCELLANEOUS) ×3 IMPLANT
CHLORAPREP W/TINT 26 (MISCELLANEOUS) ×3 IMPLANT
CLOSURE WOUND 1/2 X4 (GAUZE/BANDAGES/DRESSINGS) ×1
COVER LIGHT HANDLE STERIS (MISCELLANEOUS) ×6 IMPLANT
COVER WAND RF STERILE (DRAPES) ×3 IMPLANT
DECANTER SPIKE VIAL GLASS SM (MISCELLANEOUS) ×3 IMPLANT
DRAPE C-ARM XRAY 36X54 (DRAPES) ×3 IMPLANT
DRSG TEGADERM 4X4.75 (GAUZE/BANDAGES/DRESSINGS) ×3 IMPLANT
ELECT CAUTERY BLADE 6.4 (BLADE) ×3 IMPLANT
ELECT REM PT RETURN 9FT ADLT (ELECTROSURGICAL) ×3
ELECTRODE REM PT RTRN 9FT ADLT (ELECTROSURGICAL) ×1 IMPLANT
GLOVE BIOGEL PI IND STRL 7.0 (GLOVE) ×1 IMPLANT
GLOVE BIOGEL PI INDICATOR 7.0 (GLOVE) ×2
GLOVE SURG SYN 6.5 ES PF (GLOVE) ×3 IMPLANT
GLOVE SURG SYN 6.5 PF PI (GLOVE) ×1 IMPLANT
GOWN STRL REUS W/ TWL LRG LVL3 (GOWN DISPOSABLE) ×2 IMPLANT
GOWN STRL REUS W/TWL LRG LVL3 (GOWN DISPOSABLE) ×4
IV NS 500ML (IV SOLUTION) ×2
IV NS 500ML BAXH (IV SOLUTION) ×1 IMPLANT
KIT PORT POWER 8FR ISP CVUE (Port) ×3 IMPLANT
KIT TURNOVER KIT A (KITS) ×3 IMPLANT
LABEL OR SOLS (LABEL) ×3 IMPLANT
PACK PORT-A-CATH (MISCELLANEOUS) ×3 IMPLANT
SPONGE VERSALON 4X4 4PLY (MISCELLANEOUS) ×3 IMPLANT
STRIP CLOSURE SKIN 1/2X4 (GAUZE/BANDAGES/DRESSINGS) ×2 IMPLANT
SUT MNCRL 4-0 (SUTURE) ×2
SUT MNCRL 4-0 27XMFL (SUTURE) ×1
SUT MNCRL AB 4-0 PS2 18 (SUTURE) ×3 IMPLANT
SUT PROLENE 2 0 SH DA (SUTURE) ×3 IMPLANT
SUT VIC AB 3-0 SH 27 (SUTURE) ×2
SUT VIC AB 3-0 SH 27X BRD (SUTURE) ×1 IMPLANT
SUTURE MNCRL 4-0 27XMF (SUTURE) IMPLANT
SYR 10ML LL (SYRINGE) ×3 IMPLANT

## 2019-06-13 NOTE — Discharge Instructions (Addendum)
Port placement, Care After This sheet gives you information about how to care for yourself after your procedure. Your health care provider may also give you more specific instructions. If you have problems or questions, contact your health care provider. What can I expect after the procedure? After the procedure, it is common to have:  Soreness.  Bruising.  Itching. Follow these instructions at home: site care Follow instructions from your health care provider about how to take care of your site. Make sure you:  Wash your hands with soap and water before and after you change your bandage (dressing). If soap and water are not available, use hand sanitizer.  Leave stitches (sutures), skin glue, or adhesive strips in place. These skin closures may need to stay in place for 2 weeks or longer. If adhesive strip edges start to loosen and curl up, you may trim the loose edges. Do not remove adhesive strips completely unless your health care provider tells you to do that.  If the area bleeds or bruises, apply gentle pressure for 10 minutes.  OK TO SHOWER IN 24HRS after removing tegaderm and gauze dressing.  Keep steristrips intact  Check your site every day for signs of infection. Check for:  Redness, swelling, or pain.  Fluid or blood.  Warmth.  Pus or a bad smell.  General instructions  Rest and then return to your normal activities as told by your health care provider. .  tylenol and advil as needed for discomfort.  Please alternate between the two every four hours as needed for pain.   .  Use narcotics, if prescribed, only when tylenol and motrin is not enough to control pain. .  325-650mg  every 8hrs to max of 3000mg /24hrs (including the 325mg  in every norco dose) for the tylenol.   .  Advil up to 800mg  per dose every 8hrs as needed for pain.    Keep all follow-up visits as told by your health care provider. This is important. Contact a health care provider if:  You have  redness, swelling, or pain around your site.  You have fluid or blood coming from your site.  Your site feels warm to the touch.  You have pus or a bad smell coming from your site.  You have a fever.  Your sutures, skin glue, or adhesive strips loosen or come off sooner than expected. Get help right away if:  You have bleeding that does not stop with pressure or a dressing. Summary  After the procedure, it is common to have some soreness, bruising, and itching at the site.  Follow instructions from your health care provider about how to take care of your site.  Check your site every day for signs of infection.  Contact a health care provider if you have redness, swelling, or pain around your site, or your site feels warm to the touch.  Keep all follow-up visits as told by your health care provider. This is important. This information is not intended to replace advice given to you by your health care provider. Make sure you discuss any questions you have with your health care provider. Document Released: 05/28/2015 Document Revised: 10/29/2017 Document Reviewed: 10/29/2017 Elsevier Interactive Patient Education  2019 [place]   1) The drugs that you were given will stay in your system until tomorrow so for the next 24 hours you should not:  A) Drive an automobile B) Make any legal decisions C) Drink any alcoholic beverage  2) You may resume regular meals tomorrow.  Today it is better to start with liquids and gradually work up to solid foods.  You may eat anything you prefer, but it is better to start with liquids, then soup and crackers, and gradually work up to solid foods.   3) Please notify your doctor immediately if you have any unusual bleeding, trouble breathing, redness and pain at the surgery site, drainage, fever, or pain not relieved by medication.    4) Additional Instructions:        Please  contact your physician with any problems or Same Day Surgery at [phone], Monday through Friday 6 am to 4 pm, or [place] at [facility] number at [phone].

## 2019-06-13 NOTE — Transfer of Care (Signed)
Immediate Anesthesia Transfer of Care Note  Patient: Jared Gutierrez  Procedure(s) Performed: INSERTION PORT-A-CATH (Right Chest)  Patient Location: PACU  Anesthesia Type:General  Level of Consciousness: awake, alert  and oriented  Airway & Oxygen Therapy: Patient Spontanous Breathing and Patient connected to face mask oxygen  Post-op Assessment: Report given to RN and Post -op Vital signs reviewed and stable  Post vital signs: Reviewed and stable  Last Vitals:  Vitals Value Taken Time  BP 125/66 06/13/19 0856  Temp    Pulse 74 06/13/19 0857  Resp 22 06/13/19 0857  SpO2 100 % 06/13/19 0857  Vitals shown include unvalidated device data.  Last Pain:  Vitals:   06/13/19 0611  TempSrc: Tympanic  PainSc: 0-No pain         Complications: No apparent anesthesia complications

## 2019-06-13 NOTE — Op Note (Signed)
--  OP NOTE  DATE OF PROCEDURE: 06/13/2019   SURGEON: [name]  ANESTHESIA: LMA  PRE-OPERATIVE DIAGNOSIS:: Cancer requring port for chemotherapy   POST-OPERATIVE DIAGNOSIS: same  PROCEDURE(S):  1.) Percutaneous access of right IJ vein under ultrasound guidance   2.) Insertion of tunneled right IJ central venous catheter with subcutaneous port  INTRAOPERATIVE FINDINGS: Patent easily compressible right IJ vein with appropriate respiratory variations and well-secured tunneled central venous catheter with subcutaneous port at completion of the procedure, heplocked after confirming ease of draw and push  ESTIMATED BLOOD LOSS: Minimal (<20 mL)   SPECIMENS: None   IMPLANTS: 63F tunneled Bard PowerPort central venous catheter with subcutaneous port  DRAINS: None   COMPLICATIONS: None apparent   CONDITION AT COMPLETION: Hemodynamically stable, awake   DISPOSITION: PACU   INDICATION(S) FOR PROCEDURE:  Patient is a 79 y.o. male who presented with above diagnosis.  All risks, benefits, and alternatives to above elective procedures were discussed with the patient, who elected to proceed, and informed consent was accordingly obtained at that time.  DETAILS OF PROCEDURE:  Patient was brought to the operative suite and appropriately identified. In Trendelenburg position, right IJ venous access site was prepped and draped in the usual sterile fashion, and following a timeout, percutaneous venous access was obtained under ultrasound guidance using Seldinger technique, by which local anesthetic was injected over the area, and access needle was inserted under direct ultrasound visualization through which soft guidewire was advanced with no resistance, over which access needle was withdrawn. Guidewire was secured, attention was directed to injection of local anesthetic along the planned tunnel site, 2-3 cm transverse right chest incision was made and confirmed to accommodate the subcutaneous port, and  flushed catheter was tunneled retrograde from the port site over to the vein access site. Insertion sheath was advanced over the guidewire, which was withdrawn along with the insertion sheath dilator with no resistance. The catheter was introduced through the sheath and tip left in the [place] junction under fluoro guidance and catheter cut to appropriate length.  Catheter connected to port and placed within planned fixation site.  Fluro confirmed no kink within the entire length of the catheter at this point.  Port fixed to the pocket on two side to avoid twisting. Port was confirmed to withdraw blood and flush easily with included Heuber needle, and port heplocked. Dermis at the subcutaneous pocket was re-approximated using buried interrupted 3-0 Vicryl suture, and 4-0 Monocryl suture was used to re-approximate skin at the insertion and subcutaneous port sites in running subcuticular fashion.  Incisions then dressed with steristrips, 2x2, and tegaderm. Patient was then awakened from anesthesia and transferred to PACU in stable condition.  [place] images saved in paper chart and fluoro images saved in Epic.  Sponge and instrument count correct at end of procedure.  CXR post op confirmed proper placement of port and no evidence of pneumothorax.

## 2019-06-13 NOTE — Anesthesia Postprocedure Evaluation (Signed)
Anesthesia Post Note  Patient: Jared Gutierrez  Procedure(s) Performed: INSERTION PORT-A-CATH (Right Chest)  Patient location during evaluation: PACU Anesthesia Type: General Level of consciousness: awake and alert Pain management: pain level controlled Vital Signs Assessment: post-procedure vital signs reviewed and stable Respiratory status: spontaneous breathing and respiratory function stable Cardiovascular status: stable Anesthetic complications: no     Last Vitals:  Vitals:   06/13/19 0856 06/13/19 0906  BP: 125/66 124/78  Pulse: 73 66  Resp: 18 15  Temp: 36.5 C   SpO2: 100% 100%    Last Pain:  Vitals:   06/13/19 0611  TempSrc: Tympanic  PainSc: 0-No pain                 Clarence Dunsmore K

## 2019-06-13 NOTE — Anesthesia Preprocedure Evaluation (Signed)
Anesthesia Evaluation  Patient identified by MRN, date of birth, ID band Patient awake    Reviewed: Allergy & Precautions, NPO status , Patient's Chart, lab work & pertinent test results  History of Anesthesia Complications Negative for: history of anesthetic complications  Airway Mallampati: III       Dental  (+) Missing, Poor Dentition, Chipped, Loose,    Pulmonary neg sleep apnea, neg COPD, Not current smoker,           Cardiovascular hypertension, Pt. on medications (-) Past MI and (-) CHF (-) dysrhythmias (-) Valvular Problems/Murmurs     Neuro/Psych neg Seizures    GI/Hepatic Neg liver ROS, neg GERD  ,  Endo/Other  neg diabetesMorbid obesity  Renal/GU Renal disease (stones)     Musculoskeletal   Abdominal   Peds  Hematology   Anesthesia Other Findings   Reproductive/Obstetrics                             Anesthesia Physical Anesthesia Plan  ASA: III  Anesthesia Plan: General   Post-op Pain Management:    Induction:   PONV Risk Score and Plan: 2 and Ondansetron and Midazolam  Airway Management Planned: Nasal Cannula  Additional Equipment:   Intra-op Plan:   Post-operative Plan:   Informed Consent: I have reviewed the patients History and Physical, chart, labs and discussed the procedure including the risks, benefits and alternatives for the proposed anesthesia with the patient or authorized representative who has indicated his/her understanding and acceptance.       Plan Discussed with:   Anesthesia Plan Comments:         Anesthesia Quick Evaluation

## 2019-06-13 NOTE — Interval H&P Note (Signed)
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  The various methods of treatment have been discussed with the patient and family. After consideration of risks, benefits and other options for treatment, the patient has consented to  Procedure(s): INSERTION PORT-A-CATH (N/A) as a surgical intervention.  The patient's history has been reviewed, patient examined, no change in status, stable for surgery.  I have reviewed the patient's chart and labs.  Questions were answered to the patient's satisfaction.     Aiana Nordquist [name]

## 2019-06-17 ENCOUNTER — Encounter: Payer: Self-pay | Admitting: Hematology and Oncology

## 2019-06-17 ENCOUNTER — Other Ambulatory Visit: Payer: Self-pay | Admitting: Hematology and Oncology

## 2019-06-17 LAB — SURGICAL PATHOLOGY

## 2019-06-23 NOTE — Progress Notes (Signed)
[facility]  [address], [address] [place], [place] [address] Phone: [phone]  Fax: [phone]   Clinic Day:  06/25/2019  Referring physician: Tracie Harrier, MD  Chief Complaint: Jared Gutierrez is a 79 y.o. male with metastaticcolon cancer who is seen for assessment prior to cycle #1 FOLFOX chemotherapy.   HPI: The patient was last seen in the medical oncology clinic on 06/06/2019. At that time, he felt alright. Chemotherapy options were discussed.  He was seen for initial consuslt with Dr. [name] on 06/09/2019.  Port-a-cath was placed on 06/13/2019.   During the interim, he has done well.  He voices no complaints.  He denies any GI symptoms.  Bowels are normal.  Port placement went well.  He has had little discomfort.  He attended chemotherapy class.  He is ready to begin chemotherapy.   Past Medical History:  Diagnosis Date  . Arthritis    OSTEOARTHRITIS  . Cancer ([place])   . Cavitary lesion of lung    RIGHT LOWER LOBE  . Chicken pox   . Colon cancer ([place])   . History of kidney stones   . Hypertension   . Lipoma of colon   . Nephrolithiasis   . Nephrolithiasis   . Obesity   . Shingles   . Tubular adenoma of colon    multiple fragments    Past Surgical History:  Procedure Laterality Date  . COLON SURGERY    . COLONOSCOPY N/A 10/02/2014   Procedure: COLONOSCOPY;  Surgeon: Josefine Class, MD;  Location[facility] ENDOSCOPY;  Service: Endoscopy;  Laterality: N/A;  . COLONOSCOPY WITH PROPOFOL N/A 02/16/2017   Procedure: COLONOSCOPY WITH PROPOFOL;  Surgeon: Manya Silvas, MD;  Location: [facility] ENDOSCOPY;  Service: Endoscopy;  Laterality: N/A;  . EUS N/A 04/17/2019   Procedure: FULL UPPER ENDOSCOPIC ULTRASOUND (EUS) RADIAL;  Surgeon: Holly Bodily, MD;  Location: [facility] ENDOSCOPY;  Service: Gastroenterology;  Laterality: N/A;  . KIDNEY STONE SURGERY    . PARTIAL COLECTOMY  10/17/2013  . PORTACATH PLACEMENT Right 06/13/2019   Procedure: INSERTION PORT-A-CATH;  Surgeon: Benjamine Sprague, DO;  Location: [facility] ORS;  Service: General;  Laterality: Right;    Family History  Problem Relation Age of Onset  . Cancer Mother   . Breast cancer Mother   . COPD Father     Social History:  reports that he has never smoked. He has never used smokeless tobacco. He reports current alcohol use. He reports that he does not use drugs. 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He is married and his wife's name is [name] [phone]). The patient is accompanied by his wife  via [place] today.  Allergies: No Known Allergies  Current Medications: Current Outpatient Medications  Medication Sig Dispense Refill  . amLODipine (NORVASC) 2.5 MG tablet Take 2.5 mg by mouth daily.     [name] aspirin EC 81 MG tablet Take 81 mg by mouth daily.    . Cholecalciferol (VITAMIN D) 125 MCG (5000 UT) CAPS Take 5,000 mg by mouth.    . lidocaine-prilocaine (EMLA) cream Apply to affected area once 30 g 3  . olmesartan (BENICAR) 20 MG tablet Take 20 mg by mouth daily.    . Probiotic Product (PROBIOTIC DAILY PO) Take 1 tablet by mouth daily.    [name] acetaminophen (TYLENOL) 325 MG tablet Take 2 tablets (650 mg total) by mouth every 8 (eight) hours as needed for mild pain. (Patient not taking: Reported  on 06/24/2019) 40 tablet 0  . HYDROcodone-acetaminophen (NORCO) 5-325 MG tablet Take 1 tablet by mouth every 6 (six) hours as needed for up to 3 doses for moderate pain. (Patient not taking: Reported on 06/24/2019) 6 tablet 0  . ibuprofen (ADVIL) 800 MG tablet Take 1 tablet (800 mg total) by mouth every 8 (eight) hours as needed for mild pain or moderate pain. (Patient not taking: Reported on 06/24/2019) 30 tablet 0  . Nutritional Supplements (JUICE PLUS FIBRE PO) Take 3 tablets by mouth daily.    . ondansetron (ZOFRAN) 8 MG tablet Take 1 tablet (8 mg total) by mouth 2 (two) times daily as needed for refractory nausea / vomiting. Start on day 3 after  chemotherapy. (Patient not taking: Reported on 06/24/2019) 30 tablet 1   No current facility-administered medications for this visit.   Facility-Administered Medications Ordered in Other Visits  Medication Dose Route Frequency Provider Last Rate Last Admin  . heparin lock flush 100 unit/mL  500 Units Intravenous Once Oliver Neuwirth C, MD      . sodium chloride flush (NS) 0.9 % injection 10 mL  10 mL Intravenous PRN Lequita Asal, MD   10 mL at 06/25/19 0908    Review of Systems  Constitutional: Negative.  Negative for chills, diaphoresis, fever, malaise/fatigue and weight loss (up 11 pounds).       Feels "pretty good".  HENT: Negative.  Negative for congestion, ear pain, nosebleeds, sinus pain and sore throat.   Eyes: Negative.  Negative for blurred vision, double vision and photophobia.  Respiratory: Negative.  Negative for cough, hemoptysis, sputum production and shortness of breath.   Cardiovascular: Negative.  Negative for chest pain, palpitations, orthopnea and PND.  Gastrointestinal: Negative.  Negative for abdominal pain, blood in stool, constipation, diarrhea, heartburn, melena, nausea and vomiting.  Genitourinary: Negative.  Negative for dysuria, frequency, hematuria and urgency.  Musculoskeletal: Positive for joint pain (knee- chronic). Negative for back pain, falls, myalgias and neck pain.  Skin: Negative.  Negative for itching and rash.  Neurological: Negative.  Negative for dizziness, tremors, sensory change, speech change, focal weakness, weakness and headaches.  Endo/Heme/Allergies: Negative.  Does not bruise/bleed easily.  Psychiatric/Behavioral: Negative.  Negative for depression and memory loss. The patient is not nervous/anxious and does not have insomnia.   All other systems reviewed and are negative.  Performance status (ECOG):  0  Vitals Blood pressure (!) 157/69, pulse 63, temperature (!) 97.3 F (36.3 C), temperature source Tympanic, resp. rate 18, weight  277 lb 0.1 oz (125.7 kg), SpO2 99 %.   Physical Exam  Constitutional: He is oriented to person, place, and time. He appears well-developed and well-nourished. No distress.  HENT:  Head: Normocephalic and atraumatic.  Mouth/Throat: No oropharyngeal exudate.  Gray hair.  Male pattern baldness.  Mask.  Eyes: Pupils are equal, round, and reactive to light. Conjunctivae and EOM are normal. No scleral icterus.  Hazel eyes.  Neck: No JVD present.  Cardiovascular: Normal rate, normal heart sounds and intact distal pulses. Exam reveals no gallop.  No murmur heard. Pulmonary/Chest: Effort normal and breath sounds normal. No respiratory distress. He has no wheezes. He has no rales.  Abdominal: Soft. Bowel sounds are normal. He exhibits no distension and no mass. There is no abdominal tenderness. There is no rebound and no guarding.  Musculoskeletal:        General: No tenderness or edema. Normal range of motion.     Cervical back: Normal range of motion and  neck supple.  Lymphadenopathy:       Head (right side): No preauricular and no posterior auricular adenopathy present.       Head (left side): No preauricular, no posterior auricular and no occipital adenopathy present.    He has no cervical adenopathy.    He has no axillary adenopathy.       Right: No inguinal and no supraclavicular adenopathy present.       Left: No inguinal and no supraclavicular adenopathy present.  Neurological: He is alert and oriented to person, place, and time.  Skin: Skin is warm and dry. No rash noted. He is not diaphoretic. No erythema. No pallor.  Psychiatric: He has a normal mood and affect. His behavior is normal. Judgment and thought content normal.  Nursing note and vitals reviewed.   Infusion on 06/25/2019  Component Date Value Ref Range Status  . Sodium 06/25/2019 135  135 - 145 mmol/L Final  . Potassium 06/25/2019 3.6  3.5 - 5.1 mmol/L Final  . Chloride 06/25/2019 101  98 - 111 mmol/L Final  . CO2  06/25/2019 27  22 - 32 mmol/L Final  . Glucose, Bld 06/25/2019 152* 70 - 99 mg/dL Final  . BUN 06/25/2019 10  8 - 23 mg/dL Final  . Creatinine, Ser 06/25/2019 0.73  0.61 - 1.24 mg/dL Final  . Calcium 06/25/2019 9.4  8.9 - 10.3 mg/dL Final  . Total Protein 06/25/2019 7.0  6.5 - 8.1 g/dL Final  . Albumin 06/25/2019 3.6  3.5 - 5.0 g/dL Final  . AST 06/25/2019 30  15 - 41 U/L Final  . ALT 06/25/2019 37  0 - 44 U/L Final  . Alkaline Phosphatase 06/25/2019 61  38 - 126 U/L Final  . Total Bilirubin 06/25/2019 0.5  0.3 - 1.2 mg/dL Final  . GFR calc non Af Amer 06/25/2019 >60  >60 mL/min Final  . GFR calc Af Amer 06/25/2019 >60  >60 mL/min Final  . Anion gap 06/25/2019 7  5 - 15 Final   Performed at [facility], [address] [place], [place] [address]  . WBC 06/25/2019 8.0  4.0 - 10.5 K/uL Final  . RBC 06/25/2019 4.40  4.22 - 5.81 MIL/uL Final  . Hemoglobin 06/25/2019 13.7  13.0 - 17.0 g/dL Final  . HCT 06/25/2019 41.1  39.0 - 52.0 % Final  . MCV 06/25/2019 93.4  80.0 - 100.0 fL Final  . MCH 06/25/2019 31.1  26.0 - 34.0 pg Final  . MCHC 06/25/2019 33.3  30.0 - 36.0 g/dL Final  . RDW 06/25/2019 12.6  11.5 - 15.5 % Final  . Platelets 06/25/2019 211  150 - 400 K/uL Final  . nRBC 06/25/2019 0.0  0.0 - 0.2 % Final  . Neutrophils Relative % 06/25/2019 65  % Final  . Neutro Abs 06/25/2019 5.3  1.7 - 7.7 K/uL Final  . Lymphocytes Relative 06/25/2019 25  % Final  . Lymphs Abs 06/25/2019 2.0  0.7 - 4.0 K/uL Final  . Monocytes Relative 06/25/2019 6  % Final  . Monocytes Absolute 06/25/2019 0.5  0.1 - 1.0 K/uL Final  . Eosinophils Relative 06/25/2019 3  % Final  . Eosinophils Absolute 06/25/2019 0.2  0.0 - 0.5 K/uL Final  . Basophils Relative 06/25/2019 1  % Final  . Basophils Absolute 06/25/2019 0.0  0.0 - 0.1 K/uL Final  . Immature Granulocytes 06/25/2019 0  % Final  . Abs Immature Granulocytes 06/25/2019 0.03  0.00 - 0.07 K/uL Final  Performed at [facility],  [address], [place], [place] [address]    Assessment:  MARTELL MCFADYEN is a 79 y.o. male withmetastatic colon cancer. He was diagnosed with stage I colon cancers/p transverse colectomy on 10/17/2013. 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 Tumor extended into the submucosa. Margins were negative. There was lymphovascular invasion. 14 lymph nodes were negative.Pathologic stagewas T1 N0.  Colonoscopyon 10/02/2014 noted several 3 mm polyps and an 8 mm polyp in the cecum and transverse colon. Pathology revealed tubular adenomas negative for high-grade dysplasia and malignancy. Colonoscopyon 02/16/2017 revealed 2 diminutive polyps in the descending colon. Pathology revealed tubular adenomas without dysplasia or malignancy.  CEAhas been followed: 3.1 on 04/27/2014, 2.3 on 11/11/2014, 3.0 on 05/12/2015, 2.7 on 11/10/2015, 3.2 on 05/10/2016, 2.8 on 11/01/2016, 3.3 on 04/25/2017, 3.1 on 10/31/2017, 4.0 on 05/01/2018, 7.0on 11/28/2018, 7.4 on 12/13/2018, and 10.5 on 03/25/2019.  Chest, abdomen and pelvis CTon 12/20/2018 revealed postoperative findings of transverse colon resection without evidence of recurrent mass, definite lymphadenopathy, or distant metastatic disease to explain rising CEA. Therewereprominent sub-centimeter retroperitoneal and iliac lymph nodes, uncertain significance. There were postoperative findings about the left renal hilum, of uncertain nature, with a broad-based postoperative fat containing left-sided lumbar hernia.There was bibasilar bronchiectasis and scarring with multiple post infectious or inflammatory pneumatoceles, unchanged in comparison to CT date 06/23/2011.  PET scanon 04/02/2019 revealed a2.4 x 2.3 cm (SUV 11)hypermetabolic soft tissue density caudal and anterior to the pancreatic neckfavored to represent isolated peritoneal or nodal metastasis  in the setting of prior transverse colonic resection (expected primary drainage). Although this was immediately adjacent to the pancreas, a fat plane was maintained, arguing strongly against a pancreatic primary. Otherwise,there wasno evidence of hypermetabolic metastasis.  Upper endoscopic ultrasoundon 04/17/2019 revealeda normal esophagus, stomach, duodenum, and pancreas.There was a 2.4 x 2.4 cm irregularmassin the retroperitoneum adjacent to, but not involving the pancreatic neck. FNA and core needle biopsy were performed. Pathologyrevealed adenocarcinoma compatible with a metastatic lesion of colorectal origin. Tumor cells were positive for CK20 and CDX2andnegative for CK7.  Omniseq on 05/15/2019 revealed + KRAS and TP53.  Negative results included BRAF V600E, Her2, NRAS, NTRK, PD-L1 (<1%), and TMB 8.7/Mb (intermediate).  MMR testing from his colon resection on 10/16/2013 was intact with a low probability of MSI-H.  He has chronic right lower extremity edema. Right lower extremity duplex on 12/24/2018 revealed no DVT. He has a small right Baker's cyst.  He has received his COVID-19 vaccine.  Symptomatically, he is doing well.  Exam is stable.  Plan: 1.   Labs today: CBC with diff, CMP, CEA. 2.   Metastaticcolon cancer Clinically, he is doing well. He initially presented with stage I disease s/p resection. CEA was4.0 on 05/01/2018,7.0 on 11/28/2018,7.4 on07/31/2020, and 10.5 on 03/25/2019. Chest abdomen and pelvic CTon 12/20/2018.revealedprominent subcentimeter retroperitoneal and iliac lymph nodes. PET scan on 04/02/2019 revealeda2.4 x 2.3 cm (SUV 11)soft tissue density caudal and anterior to the pancreatic neck. 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 Lesion is not resectable. Review chemotherapy plans (FOLFOX).   Treatment is palliative.   Potential side effects reviewed.  Questions answered.   Patient attended chemotherapy class.   He consented to chemotherapy today.             Patient declined the addition of Avastin (bevacuzamab).  Labs reviewed.  Cycle #1 FOLFOX today.  Follow-up MSI/MMR on original tumor.  Discuss plan for follow-up in 1 week to assess side effects of chemotherapy.   Encourage patient to follow-up with any interim concerns. 3.   Cycle #1 FOLFOX today. 4.   RTC in 2 days for pump disconnect. 5.   RTC in 1 week for MD assessment and labs (CBC with diff, BMP). 6.   RTC in 2 weeks for MD assessment, labs (CBC with diff, CMP, CEA, Mg), and cycle #2 FOLFOX.  I discussed the assessment and treatment plan with the patient.  The patient was provided an opportunity to ask questions and all were answered.  The patient agreed with the plan and demonstrated an understanding of the instructions.  The patient was advised to call back if the symptoms worsen or if the condition fails to improve as anticipated.   Lequita Asal, MD, PhD    06/25/2019, 9:32 AM

## 2019-06-24 ENCOUNTER — Encounter: Payer: Self-pay | Admitting: Hematology and Oncology

## 2019-06-24 NOTE — Progress Notes (Signed)
No new changes noted today. The patient Name, DOB and medication has been verified by his wife. 

## 2019-06-25 ENCOUNTER — Inpatient Hospital Stay: Payer: Medicare Other

## 2019-06-25 ENCOUNTER — Inpatient Hospital Stay: Payer: Medicare Other | Attending: Hematology and Oncology | Admitting: Hematology and Oncology

## 2019-06-25 ENCOUNTER — Other Ambulatory Visit: Payer: Self-pay

## 2019-06-25 ENCOUNTER — Encounter: Payer: Self-pay | Admitting: Hematology and Oncology

## 2019-06-25 VITALS — BP 157/69 | HR 63 | Temp 97.3°F | Resp 18 | Wt 277.0 lb

## 2019-06-25 DIAGNOSIS — C786 Secondary malignant neoplasm of retroperitoneum and peritoneum: Secondary | ICD-10-CM | POA: Diagnosis not present

## 2019-06-25 DIAGNOSIS — Z7189 Other specified counseling: Secondary | ICD-10-CM | POA: Diagnosis not present

## 2019-06-25 DIAGNOSIS — Z452 Encounter for adjustment and management of vascular access device: Secondary | ICD-10-CM | POA: Diagnosis present

## 2019-06-25 DIAGNOSIS — Z5111 Encounter for antineoplastic chemotherapy: Secondary | ICD-10-CM | POA: Insufficient documentation

## 2019-06-25 DIAGNOSIS — C184 Malignant neoplasm of transverse colon: Secondary | ICD-10-CM | POA: Diagnosis not present

## 2019-06-25 DIAGNOSIS — Z85038 Personal history of other malignant neoplasm of large intestine: Secondary | ICD-10-CM

## 2019-06-25 LAB — CBC WITH DIFFERENTIAL/PLATELET
Abs Immature Granulocytes: 0.03 10*3/uL (ref 0.00–0.07)
Basophils Absolute: 0 10*3/uL (ref 0.0–0.1)
Basophils Relative: 1 %
Eosinophils Absolute: 0.2 10*3/uL (ref 0.0–0.5)
Eosinophils Relative: 3 %
HCT: 41.1 % (ref 39.0–52.0)
Hemoglobin: 13.7 g/dL (ref 13.0–17.0)
Immature Granulocytes: 0 %
Lymphocytes Relative: 25 %
Lymphs Abs: 2 10*3/uL (ref 0.7–4.0)
MCH: 31.1 pg (ref 26.0–34.0)
MCHC: 33.3 g/dL (ref 30.0–36.0)
MCV: 93.4 fL (ref 80.0–100.0)
Monocytes Absolute: 0.5 10*3/uL (ref 0.1–1.0)
Monocytes Relative: 6 %
Neutro Abs: 5.3 10*3/uL (ref 1.7–7.7)
Neutrophils Relative %: 65 %
Platelets: 211 10*3/uL (ref 150–400)
RBC: 4.4 MIL/uL (ref 4.22–5.81)
RDW: 12.6 % (ref 11.5–15.5)
WBC: 8 10*3/uL (ref 4.0–10.5)
nRBC: 0 % (ref 0.0–0.2)

## 2019-06-25 LAB — COMPREHENSIVE METABOLIC PANEL
ALT: 37 U/L (ref 0–44)
AST: 30 U/L (ref 15–41)
Albumin: 3.6 g/dL (ref 3.5–5.0)
Alkaline Phosphatase: 61 U/L (ref 38–126)
Anion gap: 7 (ref 5–15)
BUN: 10 mg/dL (ref 8–23)
CO2: 27 mmol/L (ref 22–32)
Calcium: 9.4 mg/dL (ref 8.9–10.3)
Chloride: 101 mmol/L (ref 98–111)
Creatinine, Ser: 0.73 mg/dL (ref 0.61–1.24)
GFR calc Af Amer: >60 mL/min (ref >60)
GFR calc non Af Amer: >60 mL/min (ref >60)
Glucose, Bld: 152 mg/dL — ABNORMAL HIGH (ref 70–99)
Potassium: 3.6 mmol/L (ref 3.5–5.1)
Sodium: 135 mmol/L (ref 135–145)
Total Bilirubin: 0.5 mg/dL (ref 0.3–1.2)
Total Protein: 7 g/dL (ref 6.5–8.1)

## 2019-06-25 MED ORDER — FLUOROURACIL CHEMO INJECTION 2.5 GM/50ML
1000.0000 mg | Freq: Once | INTRAVENOUS | Status: AC
  Administered 2019-06-25: 14:00:00 1000 mg via INTRAVENOUS
  Filled 2019-06-25: qty 20

## 2019-06-25 MED ORDER — DEXTROSE 5 % IV SOLN
Freq: Once | INTRAVENOUS | Status: AC
  Filled 2019-06-25: qty 250

## 2019-06-25 MED ORDER — SODIUM CHLORIDE 0.9% FLUSH
10.0000 mL | INTRAVENOUS | Status: DC | PRN
  Administered 2019-06-25: 10 mL via INTRAVENOUS
  Filled 2019-06-25: qty 10

## 2019-06-25 MED ORDER — HEPARIN SOD (PORK) LOCK FLUSH 100 UNIT/ML IV SOLN
500.0000 [IU] | Freq: Once | INTRAVENOUS | Status: DC
  Filled 2019-06-25: qty 5

## 2019-06-25 MED ORDER — LEUCOVORIN CALCIUM INJECTION 350 MG
1000.0000 mg | Freq: Once | INTRAVENOUS | Status: DC
  Filled 2019-06-25: qty 50

## 2019-06-25 MED ORDER — SODIUM CHLORIDE 0.9 % IV SOLN
5900.0000 mg | INTRAVENOUS | Status: DC
  Administered 2019-06-25: 5900 mg via INTRAVENOUS
  Filled 2019-06-25: qty 118

## 2019-06-25 MED ORDER — OXALIPLATIN CHEMO INJECTION 100 MG/20ML
200.0000 mg | Freq: Once | INTRAVENOUS | Status: AC
  Administered 2019-06-25: 11:00:00 200 mg via INTRAVENOUS
  Filled 2019-06-25: qty 40

## 2019-06-25 MED ORDER — LEUCOVORIN CALCIUM INJECTION 350 MG
1000.0000 mg | Freq: Once | INTRAVENOUS | Status: AC
  Administered 2019-06-25: 1000 mg via INTRAVENOUS
  Filled 2019-06-25: qty 50

## 2019-06-25 MED ORDER — PALONOSETRON HCL INJECTION 0.25 MG/5ML
0.2500 mg | Freq: Once | INTRAVENOUS | Status: AC
  Administered 2019-06-25: 0.25 mg via INTRAVENOUS
  Filled 2019-06-25: qty 5

## 2019-06-25 MED ORDER — SODIUM CHLORIDE 0.9 % IV SOLN
10.0000 mg | Freq: Once | INTRAVENOUS | Status: AC
  Administered 2019-06-25: 10:00:00 10 mg via INTRAVENOUS
  Filled 2019-06-25: qty 10

## 2019-06-25 NOTE — Patient Instructions (Signed)
Fluorouracil, 5-FU injection What is this medicine? FLUOROURACIL, 5-FU (flure oh YOOR a sil) is a chemotherapy drug. It slows the growth of cancer cells. This medicine is used to treat many types of cancer like breast cancer, colon or rectal cancer, pancreatic cancer, and stomach cancer. This medicine may be used for other purposes; ask your health care provider or pharmacist if you have questions. COMMON BRAND NAME(S): Adrucil What should I tell my health care provider before I take this medicine? They need to know if you have any of these conditions:  blood disorders  dihydropyrimidine dehydrogenase (DPD) deficiency  infection (especially a virus infection such as chickenpox, cold sores, or herpes)  kidney disease  liver disease  malnourished, poor nutrition  recent or ongoing radiation therapy  an unusual or allergic reaction to fluorouracil, other chemotherapy, other medicines, foods, dyes, or preservatives  pregnant or trying to get pregnant  breast-feeding How should I use this medicine? This drug is given as an infusion or injection into a vein. It is administered in a hospital or clinic by a specially trained health care professional. Talk to your pediatrician regarding the use of this medicine in children. Special care may be needed. Overdosage: If you think you have taken too much of this medicine contact a poison control center or emergency room at once. NOTE: This medicine is only for you. Do not share this medicine with others. What if I miss a dose? It is important not to miss your dose. Call your doctor or health care professional if you are unable to keep an appointment. What may interact with this medicine?  allopurinol  cimetidine  dapsone  digoxin  hydroxyurea  leucovorin  levamisole  medicines for seizures like ethotoin, fosphenytoin, phenytoin  medicines to increase blood counts like filgrastim, pegfilgrastim, sargramostim  medicines that  treat or prevent blood clots like warfarin, enoxaparin, and dalteparin  methotrexate  metronidazole  pyrimethamine  some other chemotherapy drugs like busulfan, cisplatin, estramustine, vinblastine  trimethoprim  trimetrexate  vaccines Talk to your doctor or health care professional before taking any of these medicines:  acetaminophen  aspirin  ibuprofen  ketoprofen  naproxen This list may not describe all possible interactions. Give your health care provider a list of all the medicines, herbs, non-prescription drugs, or dietary supplements you use. Also tell them if you smoke, drink alcohol, or use illegal drugs. Some items may interact with your medicine. What should I watch for while using this medicine? Visit your doctor for checks on your progress. This drug may make you feel generally unwell. This is not uncommon, as chemotherapy can affect healthy cells as well as cancer cells. Report any side effects. Continue your course of treatment even though you feel ill unless your doctor tells you to stop. In some cases, you may be given additional medicines to help with side effects. Follow all directions for their use. Call your doctor or health care professional for advice if you get a fever, chills or sore throat, or other symptoms of a cold or flu. Do not treat yourself. This drug decreases your body's ability to fight infections. Try to avoid being around people who are sick. This medicine may increase your risk to bruise or bleed. Call your doctor or health care professional if you notice any unusual bleeding. Be careful brushing and flossing your teeth or using a toothpick because you may get an infection or bleed more easily. If you have any dental work done, tell your dentist you are  receiving this medicine. Avoid taking products that contain aspirin, acetaminophen, ibuprofen, naproxen, or ketoprofen unless instructed by your doctor. These medicines may hide a fever. Do not  become pregnant while taking this medicine. Women should inform their doctor if they wish to become pregnant or think they might be pregnant. There is a potential for serious side effects to an unborn child. Talk to your health care professional or pharmacist for more information. Do not breast-feed an infant while taking this medicine. Men should inform their doctor if they wish to father a child. This medicine may lower sperm counts. Do not treat diarrhea with over the counter products. Contact your doctor if you have diarrhea that lasts more than 2 days or if it is severe and watery. This medicine can make you more sensitive to the sun. Keep out of the sun. If you cannot avoid being in the sun, wear protective clothing and use sunscreen. Do not use sun lamps or tanning beds/booths. What side effects may I notice from receiving this medicine? Side effects that you should report to your doctor or health care professional as soon as possible:  allergic reactions like skin rash, itching or hives, swelling of the face, lips, or tongue  low blood counts - this medicine may decrease the number of white blood cells, red blood cells and platelets. You may be at increased risk for infections and bleeding.  signs of infection - fever or chills, cough, sore throat, pain or difficulty passing urine  signs of decreased platelets or bleeding - bruising, pinpoint red spots on the skin, black, tarry stools, blood in the urine  signs of decreased red blood cells - unusually weak or tired, fainting spells, lightheadedness  breathing problems  changes in vision  chest pain  mouth sores  nausea and vomiting  pain, swelling, redness at site where injected  pain, tingling, numbness in the hands or feet  redness, swelling, or sores on hands or feet  stomach pain  unusual bleeding Side effects that usually do not require medical attention (report to your doctor or health care professional if they  continue or are bothersome):  changes in finger or toe nails  diarrhea  dry or itchy skin  hair loss  headache  loss of appetite  sensitivity of eyes to the light  stomach upset  unusually teary eyes This list may not describe all possible side effects. Call your doctor for medical advice about side effects. You may report side effects to FDA at 1-800-FDA-1088. Where should I keep my medicine? This drug is given in a hospital or clinic and will not be stored at home. NOTE: This sheet is a summary. It may not cover all possible information. If you have questions about this medicine, talk to your doctor, pharmacist, or health care provider.  2020 [employer] Standard (2007-09-04 13:53:16) Leucovorin injection What is this medicine? LEUCOVORIN (loo koe VOR in) is used to prevent or treat the harmful effects of some medicines. This medicine is used to treat anemia caused by a low amount of folic acid in the body. It is also used with 5-fluorouracil (5-FU) to treat colon cancer. This medicine may be used for other purposes; ask your health care provider or pharmacist if you have questions. What should I tell my health care provider before I take this medicine? They need to know if you have any of these conditions:  anemia from low levels of vitamin B-12 in the blood  an unusual or allergic reaction to  leucovorin, folic acid, other medicines, foods, dyes, or preservatives  pregnant or trying to get pregnant  breast-feeding How should I use this medicine? This medicine is for injection into a muscle or into a vein. It is given by a health care professional in a hospital or clinic setting. Talk to your pediatrician regarding the use of this medicine in children. Special care may be needed. Overdosage: If you think you have taken too much of this medicine contact a poison control center or emergency room at once. NOTE: This medicine is only for you. Do not share this medicine with  others. What if I miss a dose? This does not apply. What may interact with this medicine?  capecitabine  fluorouracil  phenobarbital  phenytoin  primidone  trimethoprim-sulfamethoxazole This list may not describe all possible interactions. Give your health care provider a list of all the medicines, herbs, non-prescription drugs, or dietary supplements you use. Also tell them if you smoke, drink alcohol, or use illegal drugs. Some items may interact with your medicine. What should I watch for while using this medicine? Your condition will be monitored carefully while you are receiving this medicine. This medicine may increase the side effects of 5-fluorouracil, 5-FU. Tell your doctor or health care professional if you have diarrhea or mouth sores that do not get better or that get worse. What side effects may I notice from receiving this medicine? Side effects that you should report to your doctor or health care professional as soon as possible:  allergic reactions like skin rash, itching or hives, swelling of the face, lips, or tongue  breathing problems  fever, infection  mouth sores  unusual bleeding or bruising  unusually weak or tired Side effects that usually do not require medical attention (report to your doctor or health care professional if they continue or are bothersome):  constipation or diarrhea  loss of appetite  nausea, vomiting This list may not describe all possible side effects. Call your doctor for medical advice about side effects. You may report side effects to FDA at 1-800-FDA-1088. Where should I keep my medicine? This drug is given in a hospital or clinic and will not be stored at home. NOTE: This sheet is a summary. It may not cover all possible information. If you have questions about this medicine, talk to your doctor, pharmacist, or health care provider.  2020 [employer] Standard (2007-11-05 16:50:29) Oxaliplatin Injection What is this  medicine? OXALIPLATIN (ox AL i PLA tin) is a chemotherapy drug. It targets fast dividing cells, like cancer cells, and causes these cells to die. This medicine is used to treat cancers of the colon and rectum, and many other cancers. This medicine may be used for other purposes; ask your health care provider or pharmacist if you have questions. COMMON BRAND NAME(S): Eloxatin What should I tell my health care provider before I take this medicine? They need to know if you have any of these conditions:  heart disease  history of irregular heartbeat  liver disease  low blood counts, like white cells, platelets, or red blood cells  lung or breathing disease, like asthma  take medicines that treat or prevent blood clots  tingling of the fingers or toes, or other nerve disorder  an unusual or allergic reaction to oxaliplatin, other chemotherapy, other medicines, foods, dyes, or preservatives  pregnant or trying to get pregnant  breast-feeding How should I use this medicine? This drug is given as an infusion into a vein. It is administered  in a hospital or clinic by a specially trained health care professional. Talk to your pediatrician regarding the use of this medicine in children. Special care may be needed. Overdosage: If you think you have taken too much of this medicine contact a poison control center or emergency room at once. NOTE: This medicine is only for you. Do not share this medicine with others. What if I miss a dose? It is important not to miss a dose. Call your doctor or health care professional if you are unable to keep an appointment. What may interact with this medicine? Do not take this medicine with any of the following medications:  cisapride  dronedarone  pimozide  thioridazine This medicine may also interact with the following medications:  aspirin and aspirin-like medicines  certain medicines that treat or prevent blood clots like warfarin, apixaban,  dabigatran, and rivaroxaban  cisplatin  cyclosporine  diuretics  medicines for infection like acyclovir, adefovir, amphotericin B, bacitracin, cidofovir, foscarnet, ganciclovir, gentamicin, pentamidine, vancomycin  NSAIDs, medicines for pain and inflammation, like ibuprofen or naproxen  other medicines that prolong the QT interval (an abnormal heart rhythm)  pamidronate  zoledronic acid This list may not describe all possible interactions. Give your health care provider a list of all the medicines, herbs, non-prescription drugs, or dietary supplements you use. Also tell them if you smoke, drink alcohol, or use illegal drugs. Some items may interact with your medicine. What should I watch for while using this medicine? Your condition will be monitored carefully while you are receiving this medicine. You may need blood work done while you are taking this medicine. This medicine may make you feel generally unwell. This is not uncommon as chemotherapy can affect healthy cells as well as cancer cells. Report any side effects. Continue your course of treatment even though you feel ill unless your healthcare professional tells you to stop. This medicine can make you more sensitive to cold. Do not drink cold drinks or use ice. Cover exposed skin before coming in contact with cold temperatures or cold objects. When out in cold weather wear warm clothing and cover your mouth and nose to warm the air that goes into your lungs. Tell your doctor if you get sensitive to the cold. Do not become pregnant while taking this medicine or for 9 months after stopping it. Women should inform their health care professional if they wish to become pregnant or think they might be pregnant. Men should not father a child while taking this medicine and for 6 months after stopping it. There is potential for serious side effects to an unborn child. Talk to your health care professional for more information. Do not  breast-feed a child while taking this medicine or for 3 months after stopping it. This medicine has caused ovarian failure in some women. This medicine may make it more difficult to get pregnant. Talk to your health care professional if you are concerned about your fertility. This medicine has caused decreased sperm counts in some men. This may make it more difficult to father a child. Talk to your health care professional if you are concerned about your fertility. This medicine may increase your risk of getting an infection. Call your health care professional for advice if you get a fever, chills, or sore throat, or other symptoms of a cold or flu. Do not treat yourself. Try to avoid being around people who are sick. Avoid taking medicines that contain aspirin, acetaminophen, ibuprofen, naproxen, or ketoprofen unless instructed by  your health care professional. These medicines may hide a fever. Be careful brushing or flossing your teeth or using a toothpick because you may get an infection or bleed more easily. If you have any dental work done, tell your dentist you are receiving this medicine. What side effects may I notice from receiving this medicine? Side effects that you should report to your doctor or health care professional as soon as possible:  allergic reactions like skin rash, itching or hives, swelling of the face, lips, or tongue  breathing problems  cough  low blood counts - this medicine may decrease the number of white blood cells, red blood cells, and platelets. You may be at increased risk for infections and bleeding  nausea, vomiting  pain, redness, or irritation at site where injected  pain, tingling, numbness in the hands or feet  signs and symptoms of bleeding such as bloody or black, tarry stools; red or dark brown urine; spitting up blood or brown material that looks like coffee grounds; red spots on the skin; unusual bruising or bleeding from the eyes, gums, or  nose  signs and symptoms of a dangerous change in heartbeat or heart rhythm like chest pain; dizziness; fast, irregular heartbeat; palpitations; feeling faint or lightheaded; falls  signs and symptoms of infection like fever; chills; cough; sore throat; pain or trouble passing urine  signs and symptoms of liver injury like dark yellow or brown urine; general ill feeling or flu-like symptoms; light-colored stools; loss of appetite; nausea; right upper belly pain; unusually weak or tired; yellowing of the eyes or skin  signs and symptoms of low red blood cells or anemia such as unusually weak or tired; feeling faint or lightheaded; falls  signs and symptoms of muscle injury like dark urine; trouble passing urine or change in the amount of urine; unusually weak or tired; muscle pain; back pain Side effects that usually do not require medical attention (report to your doctor or health care professional if they continue or are bothersome):  changes in taste  diarrhea  gas  hair loss  loss of appetite  mouth sores This list may not describe all possible side effects. Call your doctor for medical advice about side effects. You may report side effects to FDA at 1-800-FDA-1088. Where should I keep my medicine? This drug is given in a hospital or clinic and will not be stored at home. NOTE: This sheet is a summary. It may not cover all possible information. If you have questions about this medicine, talk to your doctor, pharmacist, or health care provider.  2020 [employer] Standard (2018-09-18 12:20:35)  

## 2019-06-26 ENCOUNTER — Telehealth: Payer: Self-pay | Admitting: *Deleted

## 2019-06-26 LAB — CEA: CEA: 17.8 ng/mL — ABNORMAL HIGH (ref 0.0–4.7)

## 2019-06-26 NOTE — Telephone Encounter (Signed)
Called patient and ended up speaking with patient's wife.  She said that he is doing well, no pain, no nausea and has not had to take any of his prn medications.  We will see patient in the clinic tomorrow to discontinue the pump.

## 2019-06-27 ENCOUNTER — Other Ambulatory Visit: Payer: Self-pay

## 2019-06-27 ENCOUNTER — Inpatient Hospital Stay: Payer: Medicare Other

## 2019-06-27 VITALS — BP 151/76 | HR 50 | Temp 96.6°F | Resp 18

## 2019-06-27 DIAGNOSIS — Z452 Encounter for adjustment and management of vascular access device: Secondary | ICD-10-CM | POA: Diagnosis not present

## 2019-06-27 DIAGNOSIS — Z85038 Personal history of other malignant neoplasm of large intestine: Secondary | ICD-10-CM

## 2019-06-27 DIAGNOSIS — C786 Secondary malignant neoplasm of retroperitoneum and peritoneum: Secondary | ICD-10-CM

## 2019-06-27 MED ORDER — HEPARIN SOD (PORK) LOCK FLUSH 100 UNIT/ML IV SOLN
500.0000 [IU] | Freq: Once | INTRAVENOUS | Status: AC | PRN
  Administered 2019-06-27: 13:00:00 500 [IU]
  Filled 2019-06-27: qty 5

## 2019-06-27 MED ORDER — SODIUM CHLORIDE 0.9% FLUSH
10.0000 mL | INTRAVENOUS | Status: DC | PRN
  Administered 2019-06-27: 10 mL
  Filled 2019-06-27: qty 10

## 2019-06-30 NOTE — Progress Notes (Signed)
[facility]  [address], [address] [place],  [address] Phone: [phone]  Fax: [phone]   Clinic Day:  07/02/2019  Referring physician: Tracie Harrier, MD  Chief Complaint: Jared Gutierrez is a 79 y.o. male with metastaticcolon cancer who is seen for assessment on day 8 s/p cycle #1 FOLFOX chemotherapy.   HPI: The patient was last seen in the medical oncology clinic on 06/25/2019. At that time, he was doing well.  CBC and CMP were normal. CEA was 17.8. He received cycle #1 FOLFOX.   He returned on 06/27/2019 for pump disconnect.  During the interim, he has felt "fine". He notes no trouble with the chemotherapy. He had 1 episode of loose bowels. He denied any nausea or vomiting. He had some transient cold sensitivity which has subsequently resolved.    Past Medical History:  Diagnosis Date  . Arthritis    OSTEOARTHRITIS  . Cancer ([place])   . Cavitary lesion of lung    RIGHT LOWER LOBE  . Chicken pox   . Colon cancer ([place])   . History of kidney stones   . Hypertension   . Lipoma of colon   . Nephrolithiasis   . Nephrolithiasis   . Obesity   . Shingles   . Tubular adenoma of colon    multiple fragments    Past Surgical History:  Procedure Laterality Date  . COLON SURGERY    . COLONOSCOPY N/A 10/02/2014   Procedure: COLONOSCOPY;  Surgeon: Josefine Class, MD;  Location: [facility] ENDOSCOPY;  Service: Endoscopy;  Laterality: N/A;  . COLONOSCOPY WITH PROPOFOL N/A 02/16/2017   Procedure: COLONOSCOPY WITH PROPOFOL;  Surgeon: Manya Silvas, MD;  Location: [facility] ENDOSCOPY;  Service: Endoscopy;  Laterality: N/A;  . EUS N/A 04/17/2019   Procedure: FULL UPPER ENDOSCOPIC ULTRASOUND (EUS) RADIAL;  Surgeon: Holly Bodily, MD;  Location: [facility] ENDOSCOPY;  Service: Gastroenterology;  Laterality: N/A;  . KIDNEY STONE SURGERY    . PARTIAL COLECTOMY  10/17/2013  . PORTACATH PLACEMENT Right 06/13/2019   Procedure: INSERTION PORT-A-CATH;   Surgeon: Benjamine Sprague, DO;  Location: [facility] ORS;  Service: General;  Laterality: Right;    Family History  Problem Relation Age of Onset  . Cancer Mother   . Breast cancer Mother   . COPD Father     Social History:  reports that he has never smoked. He has never used smokeless tobacco. He reports current alcohol use. He reports that he does not use drugs. 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He is married and his wife's name is [name] [phone]). The patient is alone today.  Allergies: No Known Allergies  Current Medications: Current Outpatient Medications  Medication Sig Dispense Refill  . amLODipine (NORVASC) 2.5 MG tablet Take 2.5 mg by mouth daily.     [name] aspirin EC 81 MG tablet Take 81 mg by mouth daily.    . Cholecalciferol (VITAMIN D) 125 MCG (5000 UT) CAPS Take 5,000 mg by mouth.    . lidocaine-prilocaine (EMLA) cream Apply to affected area once 30 g 3  . olmesartan (BENICAR) 20 MG tablet Take 20 mg by mouth daily.    . ondansetron (ZOFRAN) 8 MG tablet Take 1 tablet (8 mg total) by mouth 2 (two) times daily as needed for refractory nausea / vomiting. Start on day 3 after chemotherapy. 30 tablet 1  . Probiotic Product (PROBIOTIC DAILY PO) Take 1 tablet by mouth daily.    [name]  acetaminophen (TYLENOL) 325 MG tablet Take 2 tablets (650 mg total) by mouth every 8 (eight) hours as needed for mild pain. (Patient not taking: Reported on 06/24/2019) 40 tablet 0  . HYDROcodone-acetaminophen (NORCO) 5-325 MG tablet Take 1 tablet by mouth every 6 (six) hours as needed for up to 3 doses for moderate pain. (Patient not taking: Reported on 06/24/2019) 6 tablet 0  . ibuprofen (ADVIL) 800 MG tablet Take 1 tablet (800 mg total) by mouth every 8 (eight) hours as needed for mild pain or moderate pain. (Patient not taking: Reported on 06/24/2019) 30 tablet 0  . Nutritional Supplements (JUICE PLUS FIBRE PO) Take 3 tablets by mouth daily.     No current facility-administered  medications for this visit.    Review of Systems  Constitutional: Positive for weight loss (6 pounds). Negative for chills, diaphoresis, fever and malaise/fatigue.       Feels "fine".  HENT: Negative.  Negative for congestion, ear pain, nosebleeds, sinus pain and sore throat.   Eyes: Negative.  Negative for blurred vision, double vision and photophobia.  Respiratory: Negative.  Negative for cough, hemoptysis, sputum production and shortness of breath.   Cardiovascular: Negative.  Negative for chest pain, palpitations, orthopnea, leg swelling and PND.  Gastrointestinal: Negative.  Negative for abdominal pain, blood in stool, constipation, diarrhea (1 episode of loose stool s/p chemotherapy), melena, nausea and vomiting.  Genitourinary: Negative.  Negative for dysuria, frequency, hematuria and urgency.  Musculoskeletal: Negative.  Negative for back pain, falls, joint pain (chronic knee), myalgias and neck pain.  Skin: Negative.  Negative for itching and rash.  Neurological: Negative.  Negative for dizziness, tremors, sensory change, speech change, focal weakness, weakness and headaches.  Endo/Heme/Allergies: Negative.  Does not bruise/bleed easily.  Psychiatric/Behavioral: Negative.  Negative for depression and memory loss. The patient is not nervous/anxious and does not have insomnia.   All other systems reviewed and are negative.  Performance status (ECOG): 0  Vitals Blood pressure (!) 141/75, pulse 62, temperature (!) 97.1 F (36.2 C), temperature source Tympanic, resp. rate 18, weight 271 lb 11.5 oz (123.2 kg), SpO2 100 %.   Physical Exam  Constitutional: He is oriented to person, place, and time. He appears well-developed and well-nourished. No distress.  He has a cane by his side.  HENT:  Head: Normocephalic and atraumatic.  Mouth/Throat: Oropharynx is clear and moist. No oropharyngeal exudate.  Gray golf cap.  Pearline Cables hair with male pattern baldness.  Mask.  Eyes: Pupils are equal,  round, and reactive to light. Conjunctivae and EOM are normal. No scleral icterus.  Hazel eyes.  Cardiovascular: Normal rate, regular rhythm and normal heart sounds.  No murmur heard. Pulmonary/Chest: Effort normal and breath sounds normal. No respiratory distress. He has no wheezes. He has no rales. He exhibits no tenderness.  Steri strip near port.  Abdominal: Soft. Bowel sounds are normal. He exhibits no distension and no mass. There is no abdominal tenderness. There is no rebound and no guarding.  Musculoskeletal:        General: No tenderness or edema. Normal range of motion.     Cervical back: Normal range of motion and neck supple.  Lymphadenopathy:       Head (right side): No preauricular, no posterior auricular and no occipital adenopathy present.       Head (left side): No preauricular, no posterior auricular and no occipital adenopathy present.    He has no cervical adenopathy.    He has no axillary adenopathy.  Right: No inguinal and no supraclavicular adenopathy present.       Left: No inguinal and no supraclavicular adenopathy present.  Neurological: He is alert and oriented to person, place, and time.  Skin: Skin is warm and dry. No rash noted. He is not diaphoretic. No erythema. No pallor.  Psychiatric: He has a normal mood and affect. His behavior is normal. Judgment and thought content normal.  Nursing note and vitals reviewed.   Appointment on 07/02/2019  Component Date Value Ref Range Status  . WBC 07/02/2019 8.0  4.0 - 10.5 K/uL Final  . RBC 07/02/2019 4.29  4.22 - 5.81 MIL/uL Final  . Hemoglobin 07/02/2019 13.5  13.0 - 17.0 g/dL Final  . HCT 07/02/2019 40.3  39.0 - 52.0 % Final  . MCV 07/02/2019 93.9  80.0 - 100.0 fL Final  . MCH 07/02/2019 31.5  26.0 - 34.0 pg Final  . MCHC 07/02/2019 33.5  30.0 - 36.0 g/dL Final  . RDW 07/02/2019 12.2  11.5 - 15.5 % Final  . Platelets 07/02/2019 220  150 - 400 K/uL Final  . nRBC 07/02/2019 0.0  0.0 - 0.2 % Final  .  Neutrophils Relative % 07/02/2019 55  % Final  . Neutro Abs 07/02/2019 4.4  1.7 - 7.7 K/uL Final  . Lymphocytes Relative 07/02/2019 33  % Final  . Lymphs Abs 07/02/2019 2.6  0.7 - 4.0 K/uL Final  . Monocytes Relative 07/02/2019 6  % Final  . Monocytes Absolute 07/02/2019 0.5  0.1 - 1.0 K/uL Final  . Eosinophils Relative 07/02/2019 5  % Final  . Eosinophils Absolute 07/02/2019 0.4  0.0 - 0.5 K/uL Final  . Basophils Relative 07/02/2019 1  % Final  . Basophils Absolute 07/02/2019 0.1  0.0 - 0.1 K/uL Final  . Immature Granulocytes 07/02/2019 0  % Final  . Abs Immature Granulocytes 07/02/2019 0.02  0.00 - 0.07 K/uL Final   Performed at [facility], [address], [place], [place] [address]  . Sodium 07/02/2019 139  135 - 145 mmol/L Final  . Potassium 07/02/2019 3.8  3.5 - 5.1 mmol/L Final  . Chloride 07/02/2019 104  98 - 111 mmol/L Final  . CO2 07/02/2019 28  22 - 32 mmol/L Final  . Glucose, Bld 07/02/2019 100* 70 - 99 mg/dL Final  . BUN 07/02/2019 14  8 - 23 mg/dL Final  . Creatinine, Ser 07/02/2019 0.89  0.61 - 1.24 mg/dL Final  . Calcium 07/02/2019 9.0  8.9 - 10.3 mg/dL Final  . GFR calc non Af Amer 07/02/2019 >60  >60 mL/min Final  . GFR calc Af Amer 07/02/2019 >60  >60 mL/min Final  . Anion gap 07/02/2019 7  5 - 15 Final   Performed at [facility], [address], [place], [place] [address]    Assessment:  Jared Gutierrez is a 79 y.o. male withmetastatic colon cancer. He was diagnosed with stage I colon cancers/p transverse colectomy on 10/17/2013. 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 Tumor extended into the submucosa. Margins were negative. There was lymphovascular invasion. 14 lymph nodes were negative.Pathologic stagewas T1 N0.  Colonoscopyon 10/02/2014 noted several 3 mm polyps and an 8 mm polyp in the cecum and transverse colon. Pathology  revealed tubular adenomas negative for high-grade dysplasia and malignancy. Colonoscopyon 02/16/2017 revealed 2 diminutive polyps in the descending colon. Pathology revealed tubular adenomas without dysplasia or malignancy.  CEAhas been followed:  3.1 on 04/27/2014, 2.3 on 11/11/2014, 3.0 on 05/12/2015, 2.7 on 11/10/2015, 3.2 on 05/10/2016, 2.8 on 11/01/2016, 3.3 on 04/25/2017, 3.1 on 10/31/2017, 4.0 on 05/01/2018, 7.0on 11/28/2018, 7.4 on 12/13/2018, 10.5 on 03/25/2019, and 17.8 on 06/25/2019.  Chest, abdomen and pelvis CTon 12/20/2018 revealed postoperative findings of transverse colon resection without evidence of recurrent mass, definite lymphadenopathy, or distant metastatic disease to explain rising CEA. Therewereprominent sub-centimeter retroperitoneal and iliac lymph nodes, uncertain significance. There were postoperative findings about the left renal hilum, of uncertain nature, with a broad-based postoperative fat containing left-sided lumbar hernia.There was bibasilar bronchiectasis and scarring with multiple post infectious or inflammatory pneumatoceles, unchanged in comparison to CT date 06/23/2011.  PET scanon 04/02/2019 revealed a2.4 x 2.3 cm (SUV 11)hypermetabolic soft tissue density caudal and anterior to the pancreatic neckfavored to represent isolated peritoneal or nodal metastasis in the setting of prior transverse colonic resection (expected primary drainage). Although this was immediately adjacent to the pancreas, a fat plane was maintained, arguing strongly against a pancreatic primary. Otherwise,there wasno evidence of hypermetabolic metastasis.  Upper endoscopic ultrasoundon 04/17/2019 revealeda normal esophagus, stomach, duodenum, and pancreas.There was a 2.4 x 2.4 cm irregularmassin the retroperitoneum adjacent to, but not involving the pancreatic neck. FNA and core needle biopsy were performed. Pathologyrevealed adenocarcinoma compatible with a  metastatic lesion of colorectal origin. Tumor cells were positive for CK20 and CDX2andnegative for CK7.  Omniseq on 05/15/2019 revealed + KRAS and TP53.  Negative results included BRAF V600E, Her2, NRAS, NTRK, PD-L1 (<1%), and TMB 8.7/Mb (intermediate).  MMR testing from his colon resection on 10/16/2013 was intact with a low probability of MSI-H.  He is day 8 of cycle #1 FOLFOX chemotherapy (06/25/2019).  He has chronic right lower extremity edema. Right lower extremity duplex on 12/24/2018 revealed no DVT. He has a small right Baker's cyst.  He has received his COVID-19 vaccine.  Symptomatically, he is doing well.  He tolerated cycle #1 FOLFOX well with only transient cold neuropathy and 1 episode of loose stool.  Exam is stable.  Plan: 1.   Labs today: CBC with diff, BMP. 2.   Metastaticcolon cancer Clinically, he is doing well. He initially presented with stage I disease s/p resection. Chest abdomen and pelvic CTon 12/20/2018.revealedprominent subcentimeter retroperitoneal and iliac lymph nodes. PET scan on 04/02/2019 revealeda2.4 x 2.3 cm (SUV 11)soft tissue density caudal and anterior to the pancreatic neck. 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 Lesion is not resectable. NGS revealed no targetable mutation.  MMR on original resection revealed a a low probability of MSI-H.              He is day 8 of cycle #1 FOLFOX chemotherapy.   He tolerated treatment well.  Discuss symptom management.  He has antiemetics and pain medications at home to use on a prn bases.  Interventions are adequate.    3.   RTC on 07/09/2019 for MD assessment, labs (CBC with diff, CMP, CEA), and cycle #2 FOLFOX chemotherapy.  I discussed the assessment and treatment plan with the patient.  The patient was provided an opportunity to ask questions and all were  answered.  The patient agreed with the plan and demonstrated an understanding of the instructions.  The patient was advised to call back if the symptoms worsen or if the condition fails to improve as anticipated.   Lequita Asal, MD, PhD    07/02/2019, 9:50 AM  I, Selena Batten, am acting as a Education administrator for [employer]  Elmarie Mainland, MD.  I, Gibbs Mike Gip, MD, have reviewed the above documentation for accuracy and completeness, and I agree with the above.

## 2019-07-01 ENCOUNTER — Other Ambulatory Visit: Payer: Self-pay

## 2019-07-01 DIAGNOSIS — N200 Calculus of kidney: Secondary | ICD-10-CM | POA: Insufficient documentation

## 2019-07-01 DIAGNOSIS — M1990 Unspecified osteoarthritis, unspecified site: Secondary | ICD-10-CM | POA: Insufficient documentation

## 2019-07-01 DIAGNOSIS — R911 Solitary pulmonary nodule: Secondary | ICD-10-CM | POA: Insufficient documentation

## 2019-07-01 NOTE — Progress Notes (Signed)
Spoke with patients wife, she confirmed his name and DOB. Patient was sleeping. She denied and questions/concerns. States patient is doing well other that he has been experiencing sensitvity to cold.

## 2019-07-02 ENCOUNTER — Encounter: Payer: Self-pay | Admitting: Hematology and Oncology

## 2019-07-02 ENCOUNTER — Inpatient Hospital Stay (HOSPITAL_BASED_OUTPATIENT_CLINIC_OR_DEPARTMENT_OTHER): Payer: Medicare Other | Admitting: Hematology and Oncology

## 2019-07-02 ENCOUNTER — Inpatient Hospital Stay: Payer: Medicare Other

## 2019-07-02 VITALS — BP 141/75 | HR 62 | Temp 97.1°F | Resp 18 | Wt 271.7 lb

## 2019-07-02 DIAGNOSIS — C786 Secondary malignant neoplasm of retroperitoneum and peritoneum: Secondary | ICD-10-CM | POA: Diagnosis not present

## 2019-07-02 DIAGNOSIS — Z452 Encounter for adjustment and management of vascular access device: Secondary | ICD-10-CM | POA: Diagnosis not present

## 2019-07-02 DIAGNOSIS — C184 Malignant neoplasm of transverse colon: Secondary | ICD-10-CM

## 2019-07-02 LAB — CBC WITH DIFFERENTIAL/PLATELET
Abs Immature Granulocytes: 0.02 10*3/uL (ref 0.00–0.07)
Basophils Absolute: 0.1 10*3/uL (ref 0.0–0.1)
Basophils Relative: 1 %
Eosinophils Absolute: 0.4 10*3/uL (ref 0.0–0.5)
Eosinophils Relative: 5 %
HCT: 40.3 % (ref 39.0–52.0)
Hemoglobin: 13.5 g/dL (ref 13.0–17.0)
Immature Granulocytes: 0 %
Lymphocytes Relative: 33 %
Lymphs Abs: 2.6 10*3/uL (ref 0.7–4.0)
MCH: 31.5 pg (ref 26.0–34.0)
MCHC: 33.5 g/dL (ref 30.0–36.0)
MCV: 93.9 fL (ref 80.0–100.0)
Monocytes Absolute: 0.5 10*3/uL (ref 0.1–1.0)
Monocytes Relative: 6 %
Neutro Abs: 4.4 10*3/uL (ref 1.7–7.7)
Neutrophils Relative %: 55 %
Platelets: 220 10*3/uL (ref 150–400)
RBC: 4.29 MIL/uL (ref 4.22–5.81)
RDW: 12.2 % (ref 11.5–15.5)
WBC: 8 10*3/uL (ref 4.0–10.5)
nRBC: 0 % (ref 0.0–0.2)

## 2019-07-02 LAB — BASIC METABOLIC PANEL
Anion gap: 7 (ref 5–15)
BUN: 14 mg/dL (ref 8–23)
CO2: 28 mmol/L (ref 22–32)
Calcium: 9 mg/dL (ref 8.9–10.3)
Chloride: 104 mmol/L (ref 98–111)
Creatinine, Ser: 0.89 mg/dL (ref 0.61–1.24)
GFR calc Af Amer: >60 mL/min (ref >60)
GFR calc non Af Amer: >60 mL/min (ref >60)
Glucose, Bld: 100 mg/dL — ABNORMAL HIGH (ref 70–99)
Potassium: 3.8 mmol/L (ref 3.5–5.1)
Sodium: 139 mmol/L (ref 135–145)

## 2019-07-07 NOTE — Progress Notes (Signed)
[facility]  [address] [place], [place] [address] Phone: [phone]  Fax: [phone]   Clinic Day:  07/09/2019  Referring physician: Tracie Harrier, MD  Chief Complaint: Jared Gutierrez is a 79 y.o. male with metastaticcolon cancer who is seen for assessment prior to cycle #2 FOLFOX chemotherapy.  HPI: The patient was last seen in the medical oncology clinic on 07/02/2019. At that time, he was doing well.  He described 1 episode of loose stools and transient neuropathy with his first cycle of chemotherapy. Labs were normal.   During the interim, he has felt "alright".   He describes having a "normal week".  He had loose bowels on 07/02/2019.  He was nauseated on 07/02/2019 but he did not require the use of any nausea medications.  He feels good and is ready to begin cycle #2 chemotherapy.   Past Medical History:  Diagnosis Date  . Arthritis    OSTEOARTHRITIS  . Cancer ([place])   . Cavitary lesion of lung    RIGHT LOWER LOBE  . Chicken pox   . Colon cancer ([place])   . History of kidney stones   . Hypertension   . Lipoma of colon   . Nephrolithiasis   . Nephrolithiasis   . Obesity   . Shingles   . Tubular adenoma of colon    multiple fragments    Past Surgical History:  Procedure Laterality Date  . COLON SURGERY    . COLONOSCOPY N/A 10/02/2014   Procedure: COLONOSCOPY;  Surgeon: Josefine Class, MD;  Location[facility] ENDOSCOPY;  Service: Endoscopy;  Laterality: N/A;  . COLONOSCOPY WITH PROPOFOL N/A 02/16/2017   Procedure: COLONOSCOPY WITH PROPOFOL;  Surgeon: Manya Silvas, MD;  Location: [facility] ENDOSCOPY;  Service: Endoscopy;  Laterality: N/A;  . EUS N/A 04/17/2019   Procedure: FULL UPPER ENDOSCOPIC ULTRASOUND (EUS) RADIAL;  Surgeon: Holly Bodily, MD;  Location: [facility] ENDOSCOPY;  Service: Gastroenterology;  Laterality: N/A;  . KIDNEY STONE SURGERY    . PARTIAL COLECTOMY  10/17/2013  . PORTACATH PLACEMENT Right 06/13/2019     Procedure: INSERTION PORT-A-CATH;  Surgeon: Benjamine Sprague, DO;  Location: [facility] ORS;  Service: General;  Laterality: Right;    Family History  Problem Relation Age of Onset  . Cancer Mother   . Breast cancer Mother   . COPD Father     Social History:  reports that he has never smoked. He has never used smokeless tobacco. He reports current alcohol use. He reports that he does not use drugs. 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He is married and his wife's name is [name] [phone]). The patient is alone today.  Allergies: No Known Allergies  Current Medications: Current Outpatient Medications  Medication Sig Dispense Refill  . amLODipine (NORVASC) 2.5 MG tablet Take 2.5 mg by mouth daily.     [name] aspirin EC 81 MG tablet Take 81 mg by mouth daily.    . Cholecalciferol (VITAMIN D) 125 MCG (5000 UT) CAPS Take 5,000 mg by mouth.    . lidocaine-prilocaine (EMLA) cream Apply to affected area once 30 g 3  . olmesartan (BENICAR) 20 MG tablet Take 20 mg by mouth daily.    . Probiotic Product (PROBIOTIC DAILY PO) Take 1 tablet by mouth daily.    [name] acetaminophen (TYLENOL) 325 MG tablet Take 2 tablets (650 mg total) by mouth every 8 (eight) hours as needed for mild pain. (Patient not taking: Reported  on 06/24/2019) 40 tablet 0  . HYDROcodone-acetaminophen (NORCO) 5-325 MG tablet Take 1 tablet by mouth every 6 (six) hours as needed for up to 3 doses for moderate pain. (Patient not taking: Reported on 06/24/2019) 6 tablet 0  . ibuprofen (ADVIL) 800 MG tablet Take 1 tablet (800 mg total) by mouth every 8 (eight) hours as needed for mild pain or moderate pain. (Patient not taking: Reported on 06/24/2019) 30 tablet 0  . Nutritional Supplements (JUICE PLUS FIBRE PO) Take 3 tablets by mouth daily.    . ondansetron (ZOFRAN) 8 MG tablet Take 1 tablet (8 mg total) by mouth 2 (two) times daily as needed for refractory nausea / vomiting. Start on day 3 after chemotherapy. (Patient not  taking: Reported on 07/09/2019) 30 tablet 1   No current facility-administered medications for this visit.   Facility-Administered Medications Ordered in Other Visits  Medication Dose Route Frequency Provider Last Rate Last Admin  . heparin lock flush 100 unit/mL  500 Units Intravenous Once Bobbye Reinitz C, MD      . sodium chloride flush (NS) 0.9 % injection 10 mL  10 mL Intravenous PRN Lequita Asal, MD        Review of Systems  Constitutional: Negative.  Negative for chills, diaphoresis, fever, malaise/fatigue and weight loss (up 3 lbs).       Feels "alright".  HENT: Negative.  Negative for congestion, ear discharge, ear pain, hearing loss, nosebleeds, sinus pain and sore throat.   Eyes: Negative.  Negative for blurred vision and double vision.  Respiratory: Negative.  Negative for cough, hemoptysis, sputum production and shortness of breath.   Cardiovascular: Negative.  Negative for chest pain, palpitations, orthopnea and leg swelling.  Gastrointestinal: Positive for diarrhea (one episode on 07/02/2019). Negative for abdominal pain, blood in stool, constipation, heartburn, melena, nausea and vomiting.  Genitourinary: Negative.  Negative for dysuria, frequency, hematuria and urgency.  Musculoskeletal: Positive for joint pain (knee- chronic). Negative for back pain, myalgias and neck pain.  Skin: Negative.  Negative for itching and rash.  Neurological: Negative.  Negative for dizziness, tingling, sensory change, focal weakness, weakness and headaches.  Endo/Heme/Allergies: Negative.  Does not bruise/bleed easily.  Psychiatric/Behavioral: Negative.  Negative for depression and memory loss. The patient is not nervous/anxious and does not have insomnia.   All other systems reviewed and are negative.  Performance status (ECOG): 0  Vitals Blood pressure 114/60, pulse 79, temperature (!) 97.4 F (36.3 C), temperature source Tympanic, resp. rate 18, height 5' 8"  (1.727 m), weight  275 lb 0.4 oz (124.7 kg), SpO2 98 %.   Physical Exam  Constitutional: He is oriented to person, place, and time. He appears well-developed and well-nourished. No distress.  He has a cane by his side.  HENT:  Head: Normocephalic and atraumatic.  Mouth/Throat: Oropharynx is clear and moist. No oropharyngeal exudate.  Gray golf cap.  Short gray hair.  Mask.  Eyes: Pupils are equal, round, and reactive to light. Conjunctivae and EOM are normal. No scleral icterus.  Hazel eyes.  Neck: No JVD present.  Cardiovascular: Normal rate, regular rhythm and normal heart sounds.  No murmur heard. Pulmonary/Chest: Effort normal and breath sounds normal. No respiratory distress. He has no wheezes. He has no rales. He exhibits no tenderness.  Abdominal: Soft. Bowel sounds are normal. He exhibits no distension and no mass. There is no abdominal tenderness. There is no rebound and no guarding.  Musculoskeletal:        General: No tenderness  or edema. Normal range of motion.     Cervical back: Normal range of motion and neck supple.  Lymphadenopathy:       Head (right side): No preauricular, no posterior auricular and no occipital adenopathy present.       Head (left side): No preauricular, no posterior auricular and no occipital adenopathy present.    He has no cervical adenopathy.    He has no axillary adenopathy.       Right: No inguinal and no supraclavicular adenopathy present.       Left: No inguinal and no supraclavicular adenopathy present.  Neurological: He is alert and oriented to person, place, and time.  Skin: Skin is warm and dry. No rash noted. He is not diaphoretic. No erythema. No pallor.  Psychiatric: He has a normal mood and affect. His behavior is normal. Judgment and thought content normal.  Nursing note and vitals reviewed.    Infusion on 07/09/2019  Component Date Value Ref Range Status  . WBC 07/09/2019 4.9  4.0 - 10.5 K/uL Final  . RBC 07/09/2019 4.15* 4.22 - 5.81 MIL/uL Final    . Hemoglobin 07/09/2019 12.9* 13.0 - 17.0 g/dL Final  . HCT 07/09/2019 38.7* 39.0 - 52.0 % Final  . MCV 07/09/2019 93.3  80.0 - 100.0 fL Final  . MCH 07/09/2019 31.1  26.0 - 34.0 pg Final  . MCHC 07/09/2019 33.3  30.0 - 36.0 g/dL Final  . RDW 07/09/2019 12.8  11.5 - 15.5 % Final  . Platelets 07/09/2019 192  150 - 400 K/uL Final  . nRBC 07/09/2019 0.0  0.0 - 0.2 % Final  . Neutrophils Relative % 07/09/2019 58  % Final  . Neutro Abs 07/09/2019 2.9  1.7 - 7.7 K/uL Final  . Lymphocytes Relative 07/09/2019 28  % Final  . Lymphs Abs 07/09/2019 1.3  0.7 - 4.0 K/uL Final  . Monocytes Relative 07/09/2019 8  % Final  . Monocytes Absolute 07/09/2019 0.4  0.1 - 1.0 K/uL Final  . Eosinophils Relative 07/09/2019 5  % Final  . Eosinophils Absolute 07/09/2019 0.2  0.0 - 0.5 K/uL Final  . Basophils Relative 07/09/2019 1  % Final  . Basophils Absolute 07/09/2019 0.0  0.0 - 0.1 K/uL Final  . Immature Granulocytes 07/09/2019 0  % Final  . Abs Immature Granulocytes 07/09/2019 0.01  0.00 - 0.07 K/uL Final   Performed at [facility], [address], [place], [place] [address]    Assessment:  Jared Gutierrez is a 79 y.o. male withmetastatic colon cancer. He was diagnosed with stage I colon cancers/p transverse colectomy on 10/17/2013. 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 Tumor extended into the submucosa. Margins were negative. There was lymphovascular invasion. 14 lymph nodes were negative.Pathologic stagewas T1 N0.  Colonoscopyon 10/02/2014 noted several 3 mm polyps and an 8 mm polyp in the cecum and transverse colon. Pathology revealed tubular adenomas negative for high-grade dysplasia and malignancy. Colonoscopyon 02/16/2017 revealed 2 diminutive polyps in the descending colon. Pathology revealed tubular adenomas without dysplasia or malignancy.  CEAhas been followed: 3.1  on 04/27/2014, 2.3 on 11/11/2014, 3.0 on 05/12/2015, 2.7 on 11/10/2015, 3.2 on 05/10/2016, 2.8 on 11/01/2016, 3.3 on 04/25/2017, 3.1 on 10/31/2017, 4.0 on 05/01/2018, 7.0on 11/28/2018, 7.4 on 12/13/2018, 10.5 on 03/25/2019, 17.8 on 06/25/2019, and 16.2 on 07/09/2019.  Chest, abdomen and pelvis CTon 12/20/2018 revealed postoperative findings of transverse colon resection without evidence of recurrent  mass, definite lymphadenopathy, or distant metastatic disease to explain rising CEA. Therewereprominent sub-centimeter retroperitoneal and iliac lymph nodes, uncertain significance. There were postoperative findings about the left renal hilum, of uncertain nature, with a broad-based postoperative fat containing left-sided lumbar hernia.There was bibasilar bronchiectasis and scarring with multiple post infectious or inflammatory pneumatoceles, unchanged in comparison to CT date 06/23/2011.  PET scanon 04/02/2019 revealed a2.4 x 2.3 cm (SUV 11)hypermetabolic soft tissue density caudal and anterior to the pancreatic neckfavored to represent isolated peritoneal or nodal metastasis in the setting of prior transverse colonic resection (expected primary drainage). Although this was immediately adjacent to the pancreas, a fat plane was maintained, arguing strongly against a pancreatic primary. Otherwise,there wasno evidence of hypermetabolic metastasis.  Upper endoscopic ultrasoundon 04/17/2019 revealeda normal esophagus, stomach, duodenum, and pancreas.There was a 2.4 x 2.4 cm irregularmassin the retroperitoneum adjacent to, but not involving the pancreatic neck. FNA and core needle biopsy were performed. Pathologyrevealed adenocarcinoma compatible with a metastatic lesion of colorectal origin. Tumor cells were positive for CK20 and CDX2andnegative for CK7.  Omniseqon 05/15/2019 revealed + KRAS and TP53. Negative results included BRAF V600E, Her2, NRAS, NTRK, PD-L1 (<1%), and TMB 8.7/Mb  (intermediate).  MMR testing from his colon resection on 10/16/2013 was intact with a low probability of MSI-H.  He is s/p 1 cycle of FOLFOX chemotherapy (06/25/2019).  He has chronic right lower extremity edema. Right lower extremity duplex on 12/24/2018 revealed no DVT. He has a small right Baker's cyst.  He has received his COVID-19 vaccine.  Symptomatically, he is doing well.  He had 1 loose stool 1 week ago.  Exam is unremarkable.  Plan: 1.   Labs today: CBC with diff, CMP, CEA. 2.   Metastaticcolon cancer Clinically, he is doing well. He initially presented with stage I disease s/p resection. Chest abdomen and pelvic CTon 12/20/2018.revealedprominent subcentimeter retroperitoneal and iliac lymph nodes. PET scan on 04/02/2019 revealeda2.4 x 2.3 cm (SUV 11)soft tissue density caudal and anterior to the pancreatic neck. 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 Lesion is not resectable.  NGS revealed no targetable mutation.  MMR on original resection revealed a a low probability of MSI-H.  He declined Avastin.  He is s/p 1 cycle of FOLFOX chemotherapy.   He tolerated treatment well.   Initial treatment calculated with adjusted BSA.  Discuss increasing dose slightly.   Patient in agreement.  Labs reviewed.  Cycle #2 FOLFOX chemotherapy.  Discuss symptom management.  He has antiemetics and pain medications at home to use on a prn bases.  Interventions are adequate.    3.   Hypokalemia  Potassium 3.4.  Rx: potassium 20 meq po q day x 2 days. 4.   Cycle #2 FOLFOX today. 5.   RTC in 2 days for pump disconnect. 6.   RTC in 2 weeks for NP assessment, labs (CBC with diff, CMP, Mg, CEA), and cycle #3 FOLFOX chemotherapy.  Addendum:  Discussed chemotherapy dosing with Andria Rhein, chemotherapy pharmacist.  Patient received full dose chemotherapy with actual  BSA rather than adjusted BSA with cycle #1.  As patient tolerated chemotherapy well, continue full dose.  I discussed the assessment and treatment plan with the patient.  The patient was provided an opportunity to ask questions and all were answered.  The patient agreed with the plan and demonstrated an understanding of the instructions.  The patient was advised to call back if the symptoms worsen or if the condition fails to improve as anticipated.  Lequita Asal, MD, PhD    07/09/2019, 9:21 AM  I, Selena Batten, am acting as scribe for [employer]. Mike Gip, MD, PhD.  I, Pinkie Manger C. Mike Gip, MD, have reviewed the above documentation for accuracy and completeness, and I agree with the above.

## 2019-07-09 ENCOUNTER — Encounter: Payer: Self-pay | Admitting: Hematology and Oncology

## 2019-07-09 ENCOUNTER — Inpatient Hospital Stay: Payer: Medicare Other

## 2019-07-09 ENCOUNTER — Inpatient Hospital Stay (HOSPITAL_BASED_OUTPATIENT_CLINIC_OR_DEPARTMENT_OTHER): Payer: Medicare Other | Admitting: Hematology and Oncology

## 2019-07-09 ENCOUNTER — Other Ambulatory Visit: Payer: Self-pay

## 2019-07-09 VITALS — BP 114/60 | HR 79 | Temp 97.4°F | Resp 18 | Ht 68.0 in | Wt 275.0 lb

## 2019-07-09 DIAGNOSIS — E876 Hypokalemia: Secondary | ICD-10-CM | POA: Diagnosis not present

## 2019-07-09 DIAGNOSIS — C786 Secondary malignant neoplasm of retroperitoneum and peritoneum: Secondary | ICD-10-CM

## 2019-07-09 DIAGNOSIS — C184 Malignant neoplasm of transverse colon: Secondary | ICD-10-CM

## 2019-07-09 DIAGNOSIS — Z85038 Personal history of other malignant neoplasm of large intestine: Secondary | ICD-10-CM

## 2019-07-09 DIAGNOSIS — Z452 Encounter for adjustment and management of vascular access device: Secondary | ICD-10-CM | POA: Diagnosis not present

## 2019-07-09 DIAGNOSIS — Z5111 Encounter for antineoplastic chemotherapy: Secondary | ICD-10-CM

## 2019-07-09 LAB — COMPREHENSIVE METABOLIC PANEL
ALT: 38 U/L (ref 0–44)
AST: 40 U/L (ref 15–41)
Albumin: 3.3 g/dL — ABNORMAL LOW (ref 3.5–5.0)
Alkaline Phosphatase: 51 U/L (ref 38–126)
Anion gap: 12 (ref 5–15)
BUN: 9 mg/dL (ref 8–23)
CO2: 22 mmol/L (ref 22–32)
Calcium: 8.7 mg/dL — ABNORMAL LOW (ref 8.9–10.3)
Chloride: 102 mmol/L (ref 98–111)
Creatinine, Ser: 0.7 mg/dL (ref 0.61–1.24)
GFR calc Af Amer: >60 mL/min (ref >60)
GFR calc non Af Amer: >60 mL/min (ref >60)
Glucose, Bld: 171 mg/dL — ABNORMAL HIGH (ref 70–99)
Potassium: 3.4 mmol/L — ABNORMAL LOW (ref 3.5–5.1)
Sodium: 136 mmol/L (ref 135–145)
Total Bilirubin: 0.9 mg/dL (ref 0.3–1.2)
Total Protein: 6.8 g/dL (ref 6.5–8.1)

## 2019-07-09 LAB — CBC WITH DIFFERENTIAL/PLATELET
Abs Immature Granulocytes: 0.01 10*3/uL (ref 0.00–0.07)
Basophils Absolute: 0 10*3/uL (ref 0.0–0.1)
Basophils Relative: 1 %
Eosinophils Absolute: 0.2 10*3/uL (ref 0.0–0.5)
Eosinophils Relative: 5 %
HCT: 38.7 % — ABNORMAL LOW (ref 39.0–52.0)
Hemoglobin: 12.9 g/dL — ABNORMAL LOW (ref 13.0–17.0)
Immature Granulocytes: 0 %
Lymphocytes Relative: 28 %
Lymphs Abs: 1.3 10*3/uL (ref 0.7–4.0)
MCH: 31.1 pg (ref 26.0–34.0)
MCHC: 33.3 g/dL (ref 30.0–36.0)
MCV: 93.3 fL (ref 80.0–100.0)
Monocytes Absolute: 0.4 10*3/uL (ref 0.1–1.0)
Monocytes Relative: 8 %
Neutro Abs: 2.9 10*3/uL (ref 1.7–7.7)
Neutrophils Relative %: 58 %
Platelets: 192 10*3/uL (ref 150–400)
RBC: 4.15 MIL/uL — ABNORMAL LOW (ref 4.22–5.81)
RDW: 12.8 % (ref 11.5–15.5)
WBC: 4.9 10*3/uL (ref 4.0–10.5)
nRBC: 0 % (ref 0.0–0.2)

## 2019-07-09 LAB — MAGNESIUM: Magnesium: 1.8 mg/dL (ref 1.7–2.4)

## 2019-07-09 MED ORDER — LEUCOVORIN CALCIUM INJECTION 350 MG
411.6000 mg/m2 | Freq: Once | INTRAVENOUS | Status: AC
  Administered 2019-07-09: 11:00:00 1000 mg via INTRAVENOUS
  Filled 2019-07-09: qty 50

## 2019-07-09 MED ORDER — SODIUM CHLORIDE 0.9 % IV SOLN
2420.0000 mg/m2 | INTRAVENOUS | Status: DC
  Administered 2019-07-09: 13:00:00 5900 mg via INTRAVENOUS
  Filled 2019-07-09: qty 118

## 2019-07-09 MED ORDER — OXALIPLATIN CHEMO INJECTION 100 MG/20ML
83.0000 mg/m2 | Freq: Once | INTRAVENOUS | Status: AC
  Administered 2019-07-09: 11:00:00 200 mg via INTRAVENOUS
  Filled 2019-07-09: qty 40

## 2019-07-09 MED ORDER — POTASSIUM CHLORIDE CRYS ER 20 MEQ PO TBCR: 20.0000 meq | EXTENDED_RELEASE_TABLET | Freq: Every day | ORAL | 0 refills | Status: DC

## 2019-07-09 MED ORDER — SODIUM CHLORIDE 0.9 % IV SOLN
10.0000 mg | Freq: Once | INTRAVENOUS | Status: AC
  Administered 2019-07-09: 10:00:00 10 mg via INTRAVENOUS
  Filled 2019-07-09: qty 10

## 2019-07-09 MED ORDER — HEPARIN SOD (PORK) LOCK FLUSH 100 UNIT/ML IV SOLN
500.0000 [IU] | Freq: Once | INTRAVENOUS | Status: DC
  Filled 2019-07-09: qty 5

## 2019-07-09 MED ORDER — FLUOROURACIL CHEMO INJECTION 2.5 GM/50ML
411.0000 mg/m2 | Freq: Once | INTRAVENOUS | Status: AC
  Administered 2019-07-09: 13:00:00 1000 mg via INTRAVENOUS
  Filled 2019-07-09: qty 20

## 2019-07-09 MED ORDER — SODIUM CHLORIDE 0.9% FLUSH
10.0000 mL | INTRAVENOUS | Status: DC | PRN
  Administered 2019-07-09: 10 mL via INTRAVENOUS
  Filled 2019-07-09: qty 10

## 2019-07-09 MED ORDER — PALONOSETRON HCL INJECTION 0.25 MG/5ML
0.2500 mg | Freq: Once | INTRAVENOUS | Status: AC
  Administered 2019-07-09: 10:00:00 0.25 mg via INTRAVENOUS

## 2019-07-09 MED ORDER — DEXTROSE 5 % IV SOLN
Freq: Once | INTRAVENOUS | Status: AC
  Filled 2019-07-09: qty 250

## 2019-07-09 NOTE — Progress Notes (Signed)
Patient c/o nausea on 07/02/2019 but has resolved.

## 2019-07-09 NOTE — Patient Instructions (Signed)
  Take 1 potassium pill a day for 2 days only.

## 2019-07-09 NOTE — Patient Instructions (Signed)
Fluorouracil, 5-FU injection What is this medicine? FLUOROURACIL, 5-FU (flure oh YOOR a sil) is a chemotherapy drug. It slows the growth of cancer cells. This medicine is used to treat many types of cancer like breast cancer, colon or rectal cancer, pancreatic cancer, and stomach cancer. This medicine may be used for other purposes; ask your health care provider or pharmacist if you have questions. COMMON BRAND NAME(S): Adrucil What should I tell my health care provider before I take this medicine? They need to know if you have any of these conditions:  blood disorders  dihydropyrimidine dehydrogenase (DPD) deficiency  infection (especially a virus infection such as chickenpox, cold sores, or herpes)  kidney disease  liver disease  malnourished, poor nutrition  recent or ongoing radiation therapy  an unusual or allergic reaction to fluorouracil, other chemotherapy, other medicines, foods, dyes, or preservatives  pregnant or trying to get pregnant  breast-feeding How should I use this medicine? This drug is given as an infusion or injection into a vein. It is administered in a hospital or clinic by a specially trained health care professional. Talk to your pediatrician regarding the use of this medicine in children. Special care may be needed. Overdosage: If you think you have taken too much of this medicine contact a poison control center or emergency room at once. NOTE: This medicine is only for you. Do not share this medicine with others. What if I miss a dose? It is important not to miss your dose. Call your doctor or health care professional if you are unable to keep an appointment. What may interact with this medicine?  allopurinol  cimetidine  dapsone  digoxin  hydroxyurea  leucovorin  levamisole  medicines for seizures like ethotoin, fosphenytoin, phenytoin  medicines to increase blood counts like filgrastim, pegfilgrastim, sargramostim  medicines that  treat or prevent blood clots like warfarin, enoxaparin, and dalteparin  methotrexate  metronidazole  pyrimethamine  some other chemotherapy drugs like busulfan, cisplatin, estramustine, vinblastine  trimethoprim  trimetrexate  vaccines Talk to your doctor or health care professional before taking any of these medicines:  acetaminophen  aspirin  ibuprofen  ketoprofen  naproxen This list may not describe all possible interactions. Give your health care provider a list of all the medicines, herbs, non-prescription drugs, or dietary supplements you use. Also tell them if you smoke, drink alcohol, or use illegal drugs. Some items may interact with your medicine. What should I watch for while using this medicine? Visit your doctor for checks on your progress. This drug may make you feel generally unwell. This is not uncommon, as chemotherapy can affect healthy cells as well as cancer cells. Report any side effects. Continue your course of treatment even though you feel ill unless your doctor tells you to stop. In some cases, you may be given additional medicines to help with side effects. Follow all directions for their use. Call your doctor or health care professional for advice if you get a fever, chills or sore throat, or other symptoms of a cold or flu. Do not treat yourself. This drug decreases your body's ability to fight infections. Try to avoid being around people who are sick. This medicine may increase your risk to bruise or bleed. Call your doctor or health care professional if you notice any unusual bleeding. Be careful brushing and flossing your teeth or using a toothpick because you may get an infection or bleed more easily. If you have any dental work done, tell your dentist you are  receiving this medicine. Avoid taking products that contain aspirin, acetaminophen, ibuprofen, naproxen, or ketoprofen unless instructed by your doctor. These medicines may hide a fever. Do not  become pregnant while taking this medicine. Women should inform their doctor if they wish to become pregnant or think they might be pregnant. There is a potential for serious side effects to an unborn child. Talk to your health care professional or pharmacist for more information. Do not breast-feed an infant while taking this medicine. Men should inform their doctor if they wish to father a child. This medicine may lower sperm counts. Do not treat diarrhea with over the counter products. Contact your doctor if you have diarrhea that lasts more than 2 days or if it is severe and watery. This medicine can make you more sensitive to the sun. Keep out of the sun. If you cannot avoid being in the sun, wear protective clothing and use sunscreen. Do not use sun lamps or tanning beds/booths. What side effects may I notice from receiving this medicine? Side effects that you should report to your doctor or health care professional as soon as possible:  allergic reactions like skin rash, itching or hives, swelling of the face, lips, or tongue  low blood counts - this medicine may decrease the number of white blood cells, red blood cells and platelets. You may be at increased risk for infections and bleeding.  signs of infection - fever or chills, cough, sore throat, pain or difficulty passing urine  signs of decreased platelets or bleeding - bruising, pinpoint red spots on the skin, black, tarry stools, blood in the urine  signs of decreased red blood cells - unusually weak or tired, fainting spells, lightheadedness  breathing problems  changes in vision  chest pain  mouth sores  nausea and vomiting  pain, swelling, redness at site where injected  pain, tingling, numbness in the hands or feet  redness, swelling, or sores on hands or feet  stomach pain  unusual bleeding Side effects that usually do not require medical attention (report to your doctor or health care professional if they  continue or are bothersome):  changes in finger or toe nails  diarrhea  dry or itchy skin  hair loss  headache  loss of appetite  sensitivity of eyes to the light  stomach upset  unusually teary eyes This list may not describe all possible side effects. Call your doctor for medical advice about side effects. You may report side effects to FDA at 1-800-FDA-1088. Where should I keep my medicine? This drug is given in a hospital or clinic and will not be stored at home. NOTE: This sheet is a summary. It may not cover all possible information. If you have questions about this medicine, talk to your doctor, pharmacist, or health care provider.  2020 [employer] Standard (2007-09-04 13:53:16) Oxaliplatin Injection What is this medicine? OXALIPLATIN (ox AL i PLA tin) is a chemotherapy drug. It targets fast dividing cells, like cancer cells, and causes these cells to die. This medicine is used to treat cancers of the colon and rectum, and many other cancers. This medicine may be used for other purposes; ask your health care provider or pharmacist if you have questions. COMMON BRAND NAME(S): Eloxatin What should I tell my health care provider before I take this medicine? They need to know if you have any of these conditions:  heart disease  history of irregular heartbeat  liver disease  low blood counts, like white cells, platelets, or  red blood cells  lung or breathing disease, like asthma  take medicines that treat or prevent blood clots  tingling of the fingers or toes, or other nerve disorder  an unusual or allergic reaction to oxaliplatin, other chemotherapy, other medicines, foods, dyes, or preservatives  pregnant or trying to get pregnant  breast-feeding How should I use this medicine? This drug is given as an infusion into a vein. It is administered in a hospital or clinic by a specially trained health care professional. Talk to your pediatrician regarding the  use of this medicine in children. Special care may be needed. Overdosage: If you think you have taken too much of this medicine contact a poison control center or emergency room at once. NOTE: This medicine is only for you. Do not share this medicine with others. What if I miss a dose? It is important not to miss a dose. Call your doctor or health care professional if you are unable to keep an appointment. What may interact with this medicine? Do not take this medicine with any of the following medications:  cisapride  dronedarone  pimozide  thioridazine This medicine may also interact with the following medications:  aspirin and aspirin-like medicines  certain medicines that treat or prevent blood clots like warfarin, apixaban, dabigatran, and rivaroxaban  cisplatin  cyclosporine  diuretics  medicines for infection like acyclovir, adefovir, amphotericin B, bacitracin, cidofovir, foscarnet, ganciclovir, gentamicin, pentamidine, vancomycin  NSAIDs, medicines for pain and inflammation, like ibuprofen or naproxen  other medicines that prolong the QT interval (an abnormal heart rhythm)  pamidronate  zoledronic acid This list may not describe all possible interactions. Give your health care provider a list of all the medicines, herbs, non-prescription drugs, or dietary supplements you use. Also tell them if you smoke, drink alcohol, or use illegal drugs. Some items may interact with your medicine. What should I watch for while using this medicine? Your condition will be monitored carefully while you are receiving this medicine. You may need blood work done while you are taking this medicine. This medicine may make you feel generally unwell. This is not uncommon as chemotherapy can affect healthy cells as well as cancer cells. Report any side effects. Continue your course of treatment even though you feel ill unless your healthcare professional tells you to stop. This medicine can  make you more sensitive to cold. Do not drink cold drinks or use ice. Cover exposed skin before coming in contact with cold temperatures or cold objects. When out in cold weather wear warm clothing and cover your mouth and nose to warm the air that goes into your lungs. Tell your doctor if you get sensitive to the cold. Do not become pregnant while taking this medicine or for 9 months after stopping it. Women should inform their health care professional if they wish to become pregnant or think they might be pregnant. Men should not father a child while taking this medicine and for 6 months after stopping it. There is potential for serious side effects to an unborn child. Talk to your health care professional for more information. Do not breast-feed a child while taking this medicine or for 3 months after stopping it. This medicine has caused ovarian failure in some women. This medicine may make it more difficult to get pregnant. Talk to your health care professional if you are concerned about your fertility. This medicine has caused decreased sperm counts in some men. This may make it more difficult to father a child.  Talk to your health care professional if you are concerned about your fertility. This medicine may increase your risk of getting an infection. Call your health care professional for advice if you get a fever, chills, or sore throat, or other symptoms of a cold or flu. Do not treat yourself. Try to avoid being around people who are sick. Avoid taking medicines that contain aspirin, acetaminophen, ibuprofen, naproxen, or ketoprofen unless instructed by your health care professional. These medicines may hide a fever. Be careful brushing or flossing your teeth or using a toothpick because you may get an infection or bleed more easily. If you have any dental work done, tell your dentist you are receiving this medicine. What side effects may I notice from receiving this medicine? Side effects that  you should report to your doctor or health care professional as soon as possible:  allergic reactions like skin rash, itching or hives, swelling of the face, lips, or tongue  breathing problems  cough  low blood counts - this medicine may decrease the number of white blood cells, red blood cells, and platelets. You may be at increased risk for infections and bleeding  nausea, vomiting  pain, redness, or irritation at site where injected  pain, tingling, numbness in the hands or feet  signs and symptoms of bleeding such as bloody or black, tarry stools; red or dark brown urine; spitting up blood or brown material that looks like coffee grounds; red spots on the skin; unusual bruising or bleeding from the eyes, gums, or nose  signs and symptoms of a dangerous change in heartbeat or heart rhythm like chest pain; dizziness; fast, irregular heartbeat; palpitations; feeling faint or lightheaded; falls  signs and symptoms of infection like fever; chills; cough; sore throat; pain or trouble passing urine  signs and symptoms of liver injury like dark yellow or brown urine; general ill feeling or flu-like symptoms; light-colored stools; loss of appetite; nausea; right upper belly pain; unusually weak or tired; yellowing of the eyes or skin  signs and symptoms of low red blood cells or anemia such as unusually weak or tired; feeling faint or lightheaded; falls  signs and symptoms of muscle injury like dark urine; trouble passing urine or change in the amount of urine; unusually weak or tired; muscle pain; back pain Side effects that usually do not require medical attention (report to your doctor or health care professional if they continue or are bothersome):  changes in taste  diarrhea  gas  hair loss  loss of appetite  mouth sores This list may not describe all possible side effects. Call your doctor for medical advice about side effects. You may report side effects to FDA at  1-800-FDA-1088. Where should I keep my medicine? This drug is given in a hospital or clinic and will not be stored at home. NOTE: This sheet is a summary. It may not cover all possible information. If you have questions about this medicine, talk to your doctor, pharmacist, or health care provider.  2020 [employer] Standard (2018-09-18 12:20:35) Leucovorin injection What is this medicine? LEUCOVORIN (loo koe VOR in) is used to prevent or treat the harmful effects of some medicines. This medicine is used to treat anemia caused by a low amount of folic acid in the body. It is also used with 5-fluorouracil (5-FU) to treat colon cancer. This medicine may be used for other purposes; ask your health care provider or pharmacist if you have questions. What should I tell my health care  provider before I take this medicine? They need to know if you have any of these conditions:  anemia from low levels of vitamin B-12 in the blood  an unusual or allergic reaction to leucovorin, folic acid, other medicines, foods, dyes, or preservatives  pregnant or trying to get pregnant  breast-feeding How should I use this medicine? This medicine is for injection into a muscle or into a vein. It is given by a health care professional in a hospital or clinic setting. Talk to your pediatrician regarding the use of this medicine in children. Special care may be needed. Overdosage: If you think you have taken too much of this medicine contact a poison control center or emergency room at once. NOTE: This medicine is only for you. Do not share this medicine with others. What if I miss a dose? This does not apply. What may interact with this medicine?  capecitabine  fluorouracil  phenobarbital  phenytoin  primidone  trimethoprim-sulfamethoxazole This list may not describe all possible interactions. Give your health care provider a list of all the medicines, herbs, non-prescription drugs, or dietary  supplements you use. Also tell them if you smoke, drink alcohol, or use illegal drugs. Some items may interact with your medicine. What should I watch for while using this medicine? Your condition will be monitored carefully while you are receiving this medicine. This medicine may increase the side effects of 5-fluorouracil, 5-FU. Tell your doctor or health care professional if you have diarrhea or mouth sores that do not get better or that get worse. What side effects may I notice from receiving this medicine? Side effects that you should report to your doctor or health care professional as soon as possible:  allergic reactions like skin rash, itching or hives, swelling of the face, lips, or tongue  breathing problems  fever, infection  mouth sores  unusual bleeding or bruising  unusually weak or tired Side effects that usually do not require medical attention (report to your doctor or health care professional if they continue or are bothersome):  constipation or diarrhea  loss of appetite  nausea, vomiting This list may not describe all possible side effects. Call your doctor for medical advice about side effects. You may report side effects to FDA at 1-800-FDA-1088. Where should I keep my medicine? This drug is given in a hospital or clinic and will not be stored at home. NOTE: This sheet is a summary. It may not cover all possible information. If you have questions about this medicine, talk to your doctor, pharmacist, or health care provider.  2020 [employer] Standard (2007-11-05 16:50:29)

## 2019-07-10 LAB — CEA: CEA: 16.2 ng/mL — ABNORMAL HIGH (ref 0.0–4.7)

## 2019-07-11 ENCOUNTER — Inpatient Hospital Stay: Payer: Medicare Other

## 2019-07-11 ENCOUNTER — Other Ambulatory Visit: Payer: Self-pay

## 2019-07-11 VITALS — BP 166/84 | HR 72 | Temp 97.4°F | Resp 19

## 2019-07-11 DIAGNOSIS — Z85038 Personal history of other malignant neoplasm of large intestine: Secondary | ICD-10-CM

## 2019-07-11 DIAGNOSIS — C786 Secondary malignant neoplasm of retroperitoneum and peritoneum: Secondary | ICD-10-CM

## 2019-07-11 DIAGNOSIS — Z452 Encounter for adjustment and management of vascular access device: Secondary | ICD-10-CM | POA: Diagnosis not present

## 2019-07-11 MED ORDER — SODIUM CHLORIDE 0.9% FLUSH
10.0000 mL | INTRAVENOUS | Status: DC | PRN
  Administered 2019-07-11: 10 mL
  Filled 2019-07-11: qty 10

## 2019-07-11 MED ORDER — HEPARIN SOD (PORK) LOCK FLUSH 100 UNIT/ML IV SOLN
500.0000 [IU] | Freq: Once | INTRAVENOUS | Status: AC | PRN
  Administered 2019-07-11: 500 [IU]
  Filled 2019-07-11: qty 5

## 2019-07-18 ENCOUNTER — Other Ambulatory Visit: Payer: Self-pay | Admitting: Hematology and Oncology

## 2019-07-23 ENCOUNTER — Inpatient Hospital Stay: Payer: Medicare Other

## 2019-07-23 ENCOUNTER — Inpatient Hospital Stay (HOSPITAL_BASED_OUTPATIENT_CLINIC_OR_DEPARTMENT_OTHER): Payer: Medicare Other | Admitting: Oncology

## 2019-07-23 ENCOUNTER — Inpatient Hospital Stay: Payer: Medicare Other | Attending: Radiology

## 2019-07-23 ENCOUNTER — Other Ambulatory Visit: Payer: Self-pay

## 2019-07-23 ENCOUNTER — Encounter: Payer: Self-pay | Admitting: Oncology

## 2019-07-23 VITALS — BP 126/79 | HR 77 | Temp 98.6°F | Resp 18 | Ht 68.0 in | Wt 269.5 lb

## 2019-07-23 VITALS — BP 165/75 | HR 80 | Temp 97.6°F | Resp 18

## 2019-07-23 DIAGNOSIS — C184 Malignant neoplasm of transverse colon: Secondary | ICD-10-CM

## 2019-07-23 DIAGNOSIS — E876 Hypokalemia: Secondary | ICD-10-CM | POA: Diagnosis not present

## 2019-07-23 DIAGNOSIS — Z791 Long term (current) use of non-steroidal anti-inflammatories (NSAID): Secondary | ICD-10-CM | POA: Insufficient documentation

## 2019-07-23 DIAGNOSIS — C786 Secondary malignant neoplasm of retroperitoneum and peritoneum: Secondary | ICD-10-CM | POA: Insufficient documentation

## 2019-07-23 DIAGNOSIS — Z452 Encounter for adjustment and management of vascular access device: Secondary | ICD-10-CM | POA: Insufficient documentation

## 2019-07-23 DIAGNOSIS — Z5189 Encounter for other specified aftercare: Secondary | ICD-10-CM | POA: Insufficient documentation

## 2019-07-23 DIAGNOSIS — Z5111 Encounter for antineoplastic chemotherapy: Secondary | ICD-10-CM | POA: Insufficient documentation

## 2019-07-23 DIAGNOSIS — I10 Essential (primary) hypertension: Secondary | ICD-10-CM | POA: Diagnosis not present

## 2019-07-23 DIAGNOSIS — Z79899 Other long term (current) drug therapy: Secondary | ICD-10-CM | POA: Diagnosis not present

## 2019-07-23 DIAGNOSIS — R634 Abnormal weight loss: Secondary | ICD-10-CM | POA: Insufficient documentation

## 2019-07-23 DIAGNOSIS — M7121 Synovial cyst of popliteal space [Baker], right knee: Secondary | ICD-10-CM | POA: Diagnosis not present

## 2019-07-23 DIAGNOSIS — Z7982 Long term (current) use of aspirin: Secondary | ICD-10-CM | POA: Insufficient documentation

## 2019-07-23 DIAGNOSIS — R5383 Other fatigue: Secondary | ICD-10-CM | POA: Diagnosis not present

## 2019-07-23 DIAGNOSIS — Z803 Family history of malignant neoplasm of breast: Secondary | ICD-10-CM | POA: Diagnosis not present

## 2019-07-23 DIAGNOSIS — M1990 Unspecified osteoarthritis, unspecified site: Secondary | ICD-10-CM | POA: Diagnosis not present

## 2019-07-23 DIAGNOSIS — Z85038 Personal history of other malignant neoplasm of large intestine: Secondary | ICD-10-CM

## 2019-07-23 LAB — COMPREHENSIVE METABOLIC PANEL
ALT: 34 U/L (ref 0–44)
AST: 33 U/L (ref 15–41)
Albumin: 3.5 g/dL (ref 3.5–5.0)
Alkaline Phosphatase: 58 U/L (ref 38–126)
Anion gap: 10 (ref 5–15)
BUN: 18 mg/dL (ref 8–23)
CO2: 25 mmol/L (ref 22–32)
Calcium: 9.2 mg/dL (ref 8.9–10.3)
Chloride: 100 mmol/L (ref 98–111)
Creatinine, Ser: 0.79 mg/dL (ref 0.61–1.24)
GFR calc Af Amer: >60 mL/min (ref >60)
GFR calc non Af Amer: >60 mL/min (ref >60)
Glucose, Bld: 126 mg/dL — ABNORMAL HIGH (ref 70–99)
Potassium: 3.8 mmol/L (ref 3.5–5.1)
Sodium: 135 mmol/L (ref 135–145)
Total Bilirubin: 0.9 mg/dL (ref 0.3–1.2)
Total Protein: 6.9 g/dL (ref 6.5–8.1)

## 2019-07-23 LAB — CBC WITH DIFFERENTIAL/PLATELET
Abs Immature Granulocytes: 0.01 10*3/uL (ref 0.00–0.07)
Basophils Absolute: 0 10*3/uL (ref 0.0–0.1)
Basophils Relative: 1 %
Eosinophils Absolute: 0 10*3/uL (ref 0.0–0.5)
Eosinophils Relative: 1 %
HCT: 38.4 % — ABNORMAL LOW (ref 39.0–52.0)
Hemoglobin: 12.6 g/dL — ABNORMAL LOW (ref 13.0–17.0)
Immature Granulocytes: 0 %
Lymphocytes Relative: 38 %
Lymphs Abs: 1.6 10*3/uL (ref 0.7–4.0)
MCH: 30.7 pg (ref 26.0–34.0)
MCHC: 32.8 g/dL (ref 30.0–36.0)
MCV: 93.7 fL (ref 80.0–100.0)
Monocytes Absolute: 0.6 10*3/uL (ref 0.1–1.0)
Monocytes Relative: 14 %
Neutro Abs: 1.9 10*3/uL (ref 1.7–7.7)
Neutrophils Relative %: 46 %
Platelets: 166 10*3/uL (ref 150–400)
RBC: 4.1 MIL/uL — ABNORMAL LOW (ref 4.22–5.81)
RDW: 13.2 % (ref 11.5–15.5)
WBC: 4.1 10*3/uL (ref 4.0–10.5)
nRBC: 0 % (ref 0.0–0.2)

## 2019-07-23 LAB — MAGNESIUM: Magnesium: 1.8 mg/dL (ref 1.7–2.4)

## 2019-07-23 MED ORDER — PALONOSETRON HCL INJECTION 0.25 MG/5ML
0.2500 mg | Freq: Once | INTRAVENOUS | Status: AC
  Administered 2019-07-23: 0.25 mg via INTRAVENOUS
  Filled 2019-07-23: qty 5

## 2019-07-23 MED ORDER — LEUCOVORIN CALCIUM INJECTION 350 MG
411.6000 mg/m2 | Freq: Once | INTRAVENOUS | Status: AC
  Administered 2019-07-23: 1000 mg via INTRAVENOUS
  Filled 2019-07-23: qty 50

## 2019-07-23 MED ORDER — SODIUM CHLORIDE 0.9 % IV SOLN
10.0000 mg | Freq: Once | INTRAVENOUS | Status: AC
  Administered 2019-07-23: 10 mg via INTRAVENOUS
  Filled 2019-07-23: qty 10

## 2019-07-23 MED ORDER — SODIUM CHLORIDE 0.9 % IV SOLN
2420.0000 mg/m2 | INTRAVENOUS | Status: DC
  Administered 2019-07-23: 5900 mg via INTRAVENOUS
  Filled 2019-07-23: qty 118

## 2019-07-23 MED ORDER — SODIUM CHLORIDE 0.9% FLUSH
10.0000 mL | INTRAVENOUS | Status: DC | PRN
  Administered 2019-07-23: 10 mL via INTRAVENOUS
  Filled 2019-07-23: qty 10

## 2019-07-23 MED ORDER — OXALIPLATIN CHEMO INJECTION 100 MG/20ML
83.0000 mg/m2 | Freq: Once | INTRAVENOUS | Status: AC
  Administered 2019-07-23: 200 mg via INTRAVENOUS
  Filled 2019-07-23: qty 40

## 2019-07-23 MED ORDER — DEXTROSE 5 % IV SOLN
Freq: Once | INTRAVENOUS | Status: AC
  Filled 2019-07-23: qty 250

## 2019-07-23 MED ORDER — HEPARIN SOD (PORK) LOCK FLUSH 100 UNIT/ML IV SOLN
500.0000 [IU] | Freq: Once | INTRAVENOUS | Status: DC
  Filled 2019-07-23: qty 5

## 2019-07-23 MED ORDER — FLUOROURACIL CHEMO INJECTION 2.5 GM/50ML
411.0000 mg/m2 | Freq: Once | INTRAVENOUS | Status: AC
  Administered 2019-07-23: 1000 mg via INTRAVENOUS
  Filled 2019-07-23: qty 20

## 2019-07-23 NOTE — Progress Notes (Signed)
[facility]  [address] [place], [place] [address] Phone: [phone]  Fax: [phone]   Clinic Day:  07/23/2019  Referring physician: Tracie Harrier, MD  Chief Complaint: Jared Gutierrez is a 79 y.o. male with metastaticcolon cancer who is seen for assessment prior to cycle #3 FOLFOX chemotherapy.  HPI: The patient was last seen in the medical oncology clinic on 07/09/2019. At that time, he was feeling alright.  He described having a normal week with one episode of nausea and 1 day of loose bowels.  He did not require intervention for either symptom.  He had mild hypokalemia and was prescribed 2 days worth of 20 mEq twice daily.   In the interim, he has done well.  He admits to 1 day after his pump removed where he experienced nausea requiring the use of Zofran.  He also had a day of diarrhea which resolved on its own.  His appetite has been stable.  He has been eating "like normal".  Notes that he has been more active the past 2 weeks and thinks his weight loss could be due to this.  He uses cane to get around.  He denies any chest pain, shortness of breath or constipation.  Past Medical History:  Diagnosis Date  . Arthritis    OSTEOARTHRITIS  . Cancer ([place])   . Cavitary lesion of lung    RIGHT LOWER LOBE  . Chicken pox   . Colon cancer ([place])   . History of kidney stones   . Hypertension   . Lipoma of colon   . Nephrolithiasis   . Nephrolithiasis   . Obesity   . Shingles   . Tubular adenoma of colon    multiple fragments    Past Surgical History:  Procedure Laterality Date  . COLON SURGERY    . COLONOSCOPY N/A 10/02/2014   Procedure: COLONOSCOPY;  Surgeon: Josefine Class, MD;  Location: [facility] ENDOSCOPY;  Service: Endoscopy;  Laterality: N/A;  . COLONOSCOPY WITH PROPOFOL N/A 02/16/2017   Procedure: COLONOSCOPY WITH PROPOFOL;  Surgeon: Manya Silvas, MD;  Location: [facility] ENDOSCOPY;  Service: Endoscopy;  Laterality: N/A;  .  EUS N/A 04/17/2019   Procedure: FULL UPPER ENDOSCOPIC ULTRASOUND (EUS) RADIAL;  Surgeon: Holly Bodily, MD;  Location: [facility] ENDOSCOPY;  Service: Gastroenterology;  Laterality: N/A;  . KIDNEY STONE SURGERY    . PARTIAL COLECTOMY  10/17/2013  . PORTACATH PLACEMENT Right 06/13/2019   Procedure: INSERTION PORT-A-CATH;  Surgeon: Benjamine Sprague, DO;  Location: [facility] ORS;  Service: General;  Laterality: Right;    Family History  Problem Relation Age of Onset  . Cancer Mother   . Breast cancer Mother   . COPD Father     Social History:  reports that he has never smoked. He has never used smokeless tobacco. He reports current alcohol use. He reports that he does not use drugs. 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He is married and his wife's name is [name] [phone]). The patient is alone today.  Allergies: No Known Allergies  Current Medications: Current Outpatient Medications  Medication Sig Dispense Refill  . amLODipine (NORVASC) 2.5 MG tablet Take 2.5 mg by mouth daily.     [name] aspirin EC 81 MG tablet Take 81 mg by mouth daily.    . Cholecalciferol (VITAMIN D) 125 MCG (5000 UT) CAPS Take 5,000 mg by mouth.    [name] HYDROcodone-acetaminophen (NORCO) 5-325 MG tablet Take 1  tablet by mouth every 6 (six) hours as needed for up to 3 doses for moderate pain. (Patient not taking: Reported on 06/24/2019) 6 tablet 0  . ibuprofen (ADVIL) 800 MG tablet Take 1 tablet (800 mg total) by mouth every 8 (eight) hours as needed for mild pain or moderate pain. (Patient not taking: Reported on 06/24/2019) 30 tablet 0  . lidocaine-prilocaine (EMLA) cream Apply to affected area once 30 g 3  . Nutritional Supplements (JUICE PLUS FIBRE PO) Take 3 tablets by mouth daily.    [name] olmesartan (BENICAR) 20 MG tablet Take 20 mg by mouth daily.    . ondansetron (ZOFRAN) 8 MG tablet Take 1 tablet (8 mg total) by mouth 2 (two) times daily as needed for refractory nausea / vomiting. Start on day 3  after chemotherapy. (Patient not taking: Reported on 07/09/2019) 30 tablet 1  . potassium chloride SA (KLOR-CON) 20 MEQ tablet Take 1 tablet (20 mEq total) by mouth daily. for 2 days only 10 tablet 0  . Probiotic Product (PROBIOTIC DAILY PO) Take 1 tablet by mouth daily.     No current facility-administered medications for this visit.   Facility-Administered Medications Ordered in Other Visits  Medication Dose Route Frequency Provider Last Rate Last Admin  . heparin lock flush 100 unit/mL  500 Units Intravenous Once Faythe Casa E, NP      . sodium chloride flush (NS) 0.9 % injection 10 mL  10 mL Intravenous PRN Jacquelin Hawking, NP   10 mL at 07/23/19 0849    Review of Systems  Constitutional: Positive for malaise/fatigue and weight loss. Negative for chills and fever.  HENT: Negative for congestion, ear pain and tinnitus.   Eyes: Negative.  Negative for blurred vision and double vision.  Respiratory: Negative.  Negative for cough, sputum production and shortness of breath.   Cardiovascular: Negative.  Negative for chest pain, palpitations and leg swelling.  Gastrointestinal: Positive for diarrhea and nausea. Negative for abdominal pain, constipation and vomiting.  Genitourinary: Negative for dysuria, frequency and urgency.  Musculoskeletal: Positive for joint pain (chronic bilateral knee pain). Negative for back pain and falls.  Skin: Negative.  Negative for rash.  Neurological: Negative.  Negative for weakness and headaches.  Endo/Heme/Allergies: Negative.  Does not bruise/bleed easily.  Psychiatric/Behavioral: Negative.  Negative for depression. The patient is not nervous/anxious and does not have insomnia.    Performance status (ECOG): 0  Vitals There were no vitals taken for this visit.   Physical Exam  Constitutional: He is oriented to person, place, and time. Vital signs are normal. He appears well-developed and well-nourished.  HENT:  Head: Normocephalic and atraumatic.    Eyes: Pupils are equal, round, and reactive to light.  Cardiovascular: Normal rate and regular rhythm.  No murmur heard. Pulmonary/Chest: Effort normal and breath sounds normal. He has no wheezes.  Abdominal: Soft. Bowel sounds are normal. He exhibits no distension and no mass. There is no abdominal tenderness.  Musculoskeletal:        General: No edema. Normal range of motion.     Cervical back: Normal range of motion.  Neurological: He is alert and oriented to person, place, and time.  Skin: Skin is warm and dry.  Psychiatric: He has a normal mood and affect. His behavior is normal.     No visits with results within 3 Day(s) from this visit.  Latest known visit with results is:  Infusion on 07/09/2019  Component Date Value Ref Range Status  . Magnesium  07/09/2019 1.8  1.7 - 2.4 mg/dL Final   Performed at [facility], [address], [place], [place] [address]  . CEA 07/09/2019 16.2* 0.0 - 4.7 ng/mL Final   Comment: (NOTE)                             Nonsmokers          <3.9                             Smokers             <5.6 [employer] Electrochemiluminescence Immunoassay (ECLIA) Values obtained with different assay methods or kits cannot be used interchangeably.  Results cannot be interpreted as absolute evidence of the presence or absence of malignant disease. Performed At: [facility] [place], [place] [id number] Rush Farmer MD XY:[id number]   . Sodium 07/09/2019 136  135 - 145 mmol/L Final  . Potassium 07/09/2019 3.4* 3.5 - 5.1 mmol/L Final  . Chloride 07/09/2019 102  98 - 111 mmol/L Final  . CO2 07/09/2019 22  22 - 32 mmol/L Final  . Glucose, Bld 07/09/2019 171* 70 - 99 mg/dL Final   Glucose reference range applies only to samples taken after fasting for at least 8 hours.  . BUN 07/09/2019 9  8 - 23 mg/dL Final  . Creatinine, Ser 07/09/2019 0.70  0.61 - 1.24 mg/dL Final  . Calcium 07/09/2019 8.7* 8.9 - 10.3 mg/dL Final   . Total Protein 07/09/2019 6.8  6.5 - 8.1 g/dL Final  . Albumin 07/09/2019 3.3* 3.5 - 5.0 g/dL Final  . AST 07/09/2019 40  15 - 41 U/L Final  . ALT 07/09/2019 38  0 - 44 U/L Final  . Alkaline Phosphatase 07/09/2019 51  38 - 126 U/L Final  . Total Bilirubin 07/09/2019 0.9  0.3 - 1.2 mg/dL Final  . GFR calc non Af Amer 07/09/2019 >60  >60 mL/min Final  . GFR calc Af Amer 07/09/2019 >60  >60 mL/min Final  . Anion gap 07/09/2019 12  5 - 15 Final   Performed at [facility], [address] [place], [place] [address]  . WBC 07/09/2019 4.9  4.0 - 10.5 K/uL Final  . RBC 07/09/2019 4.15* 4.22 - 5.81 MIL/uL Final  . Hemoglobin 07/09/2019 12.9* 13.0 - 17.0 g/dL Final  . HCT 07/09/2019 38.7* 39.0 - 52.0 % Final  . MCV 07/09/2019 93.3  80.0 - 100.0 fL Final  . MCH 07/09/2019 31.1  26.0 - 34.0 pg Final  . MCHC 07/09/2019 33.3  30.0 - 36.0 g/dL Final  . RDW 07/09/2019 12.8  11.5 - 15.5 % Final  . Platelets 07/09/2019 192  150 - 400 K/uL Final  . nRBC 07/09/2019 0.0  0.0 - 0.2 % Final  . Neutrophils Relative % 07/09/2019 58  % Final  . Neutro Abs 07/09/2019 2.9  1.7 - 7.7 K/uL Final  . Lymphocytes Relative 07/09/2019 28  % Final  . Lymphs Abs 07/09/2019 1.3  0.7 - 4.0 K/uL Final  . Monocytes Relative 07/09/2019 8  % Final  . Monocytes Absolute 07/09/2019 0.4  0.1 - 1.0 K/uL Final  . Eosinophils Relative 07/09/2019 5  % Final  . Eosinophils Absolute 07/09/2019 0.2  0.0 - 0.5 K/uL Final  . Basophils Relative 07/09/2019 1  % Final  . Basophils Absolute 07/09/2019 0.0  0.0 -  0.1 K/uL Final  . Immature Granulocytes 07/09/2019 0  % Final  . Abs Immature Granulocytes 07/09/2019 0.01  0.00 - 0.07 K/uL Final   Performed at [facility], [address], [place], [place] [address]    Assessment:  Jared Gutierrez is a 79 y.o. male withmetastatic colon cancer. He was diagnosed with stage I colon cancers/p transverse colectomy on 10/17/2013. Pathologyrevealed a 1 cm moderately  differentiated invasive adenocarcinoma arising in a 4.6 cm tubulovillous adenoma with high-grade dysplasia. Tumor extended into the submucosa. Margins were negative. There was lymphovascular invasion. 14 lymph nodes were negative.Pathologic stagewas T1 N0.  Colonoscopyon 10/02/2014 noted several 3 mm polyps and an 8 mm polyp in the cecum and transverse colon. Pathology revealed tubular adenomas negative for high-grade dysplasia and malignancy. Colonoscopyon 02/16/2017 revealed 2 diminutive polyps in the descending colon. Pathology revealed tubular adenomas without dysplasia or malignancy.  CEAhas been followed: 3.1 on 04/27/2014, 2.3 on 11/11/2014, 3.0 on 05/12/2015, 2.7 on 11/10/2015, 3.2 on 05/10/2016, 2.8 on 11/01/2016, 3.3 on 04/25/2017, 3.1 on 10/31/2017, 4.0 on 05/01/2018, 7.0on 11/28/2018, 7.4 on 12/13/2018, 10.5 on 03/25/2019, 17.8 on 06/25/2019, and 16.2 on 07/09/2019.  Chest, abdomen and pelvis CTon 12/20/2018 revealed postoperative findings of transverse colon resection without evidence of recurrent mass, definite lymphadenopathy, or distant metastatic disease to explain rising CEA. Therewereprominent sub-centimeter retroperitoneal and iliac lymph nodes, uncertain significance. There were postoperative findings about the left renal hilum, of uncertain nature, with a broad-based postoperative fat containing left-sided lumbar hernia.There was bibasilar bronchiectasis and scarring with multiple post infectious or inflammatory pneumatoceles, unchanged in comparison to CT date 06/23/2011.  PET scanon 04/02/2019 revealed a2.4 x 2.3 cm (SUV 11)hypermetabolic soft tissue density caudal and anterior to the pancreatic neckfavored to represent isolated peritoneal or nodal metastasis in the setting of prior transverse colonic resection (expected primary drainage). Although this was immediately adjacent to the pancreas, a fat plane was maintained, arguing strongly against a pancreatic  primary. Otherwise,there wasno evidence of hypermetabolic metastasis.  Upper endoscopic ultrasoundon 04/17/2019 revealeda normal esophagus, stomach, duodenum, and pancreas.There was a 2.4 x 2.4 cm irregularmassin the retroperitoneum adjacent to, but not involving the pancreatic neck. FNA and core needle biopsy were performed. Pathologyrevealed adenocarcinoma compatible with a metastatic lesion of colorectal origin. Tumor cells were positive for CK20 and CDX2andnegative for CK7.  Omniseqon 05/15/2019 revealed + KRAS and TP53. Negative results included BRAF V600E, Her2, NRAS, NTRK, PD-L1 (<1%), and TMB 8.7/Mb (intermediate).  MMR testing from his colon resection on 10/16/2013 was intact with a low probability of MSI-H.  He is s/p 2 cycle of FOLFOX chemotherapy (07/09/2019).  He has chronic right lower extremity edema. Right lower extremity duplex on 12/24/2018 revealed no DVT. He has a small right Baker's cyst.  He has received his COVID-19 vaccine.  Symptomatically, he is doing well.  He had 1 day after his pump was removed of loose stools and nausea. Weight is down 6lbs.    Plan: 1.   Labs today: CBC with diff, CMP, CEA. 2.   Metastaticcolon cancer Clinically, he is doing well. He initially presented with stage I disease s/p resection. Chest abdomen and pelvic CTon 12/20/2018.revealedprominent subcentimeter retroperitoneal and iliac lymph nodes. PET scan on 04/02/2019 revealeda2.4 x 2.3 cm (SUV 11)soft tissue density caudal and anterior to the pancreatic neck. 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 Lesion is not resectable.  NGS revealed no targetable mutation.  MMR on original resection revealed a a low probability of MSI-H.  He declined Avastin.  He is s/p 2 cycle of FOLFOX chemotherapy.   He tolerated treatment well.   Initial treatment  calculated with adjusted BSA.  Discuss increasing dose slightly.   Patient in agreement.  Labs reviewed.  Cycle #3 FOLFOX chemotherapy.  ANC 1.9. (previously 2.9, 4.4). Will bring back next week to re-check lab work with possible Granix.  Discuss symptom management.  He has antiemetics and pain medications at home to use on a prn bases.  Interventions are adequate.    3.   Hypokalemia  Potassium 3.8.  Continue to monitor. . 4.   Weight Loss  Supplements provided.   Dietician referral. 5.   Cycle #3 FOLFOX today. 6.   RTC in 2 days for pump disconnect. 7.   RTC in 1 week for labs and possible granix (Need to get this pre-auth) 8.   RTC in 2 weeks for NP assessment, labs (CBC with diff, CMP, Mg, CEA), and cycle #3 FOLFOX chemotherapy.   I discussed the assessment and treatment plan with the patient.  The patient was provided an opportunity to ask questions and all were answered.  The patient agreed with the plan and demonstrated an understanding of the instructions.  The patient was advised to call back if the symptoms worsen or if the condition fails to improve as anticipated.  Greater than 50% was spent in counseling and coordination of care with this patient including but not limited to discussion of the relevant topics above (See A&P) including, but not limited to diagnosis and management of acute and chronic medical conditions.   Faythe Casa, NP 07/23/2019 10:00 AM

## 2019-07-23 NOTE — Patient Instructions (Addendum)
Neutropenia Neutropenia is a condition that occurs when you have a lower-than-normal level of a type of white blood cell (neutrophil) in your body. Neutrophils are made in the spongy center of large bones (bone marrow), and they fight infections. Neutrophils are your body's main defense against bacterial and fungal infections. The fewer neutrophils you have and the longer your body remains without them, the greater your risk of getting a severe infection. What are the causes? This condition can occur if your body uses up or destroys neutrophils faster than your bone marrow can make them. Neutropenia may be caused by:  A bacterial or fungal infection.  Allergic disorders.  Reactions to some medicines.  An autoimmune disease.  An enlarged spleen. This condition can also occur if your bone marrow does not produce enough neutrophils. This problem may be caused by:  Cancer.  Cancer treatments, such as radiation or chemotherapy.  Viral infections.  Medicines, such as phenytoin.  Vitamin B12 deficiency.  Diseases of the bone marrow.  Environmental toxins, such as insecticides. What are the signs or symptoms? This condition does not usually cause symptoms. If symptoms are present, they are usually caused by an underlying infection. Symptoms of an infection may include:  Fever.  Chills.  Swollen glands.  Oral or anal ulcers.  Cough and shortness of breath.  Rash.  Skin infection.  Fatigue. How is this diagnosed? Your health care provider may suspect neutropenia if you have:  A condition that may cause neutropenia.  Symptoms during or after treatment for cancer.  Symptoms of infection, especially fever.  Frequent and unusual infections. This condition is diagnosed based on your medical history and a physical exam. Tests will also be done, such as:  A complete blood count (CBC).  A procedure to collect a sample of bone marrow for examination (bone marrow  biopsy).  A chest X-ray.  A urine culture.  A blood culture. How is this treated? Treatment depends on the underlying cause and severity of your condition. Mild neutropenia may not require treatment. Treatment may include medicines, such as:  Antibiotic medicine given through an IV.  Antiviral medicines.  Antifungal medicines.  A medicine to increase neutrophil production (colony-stimulating factor). You may get this drug through an IV or by injection.  Steroids given through an IV. If an underlying condition is causing neutropenia, you may need treatment for that condition. If medicines or cancer treatments are causing neutropenia, your health care provider may have you stop the medicines or treatment. Follow these instructions at home: Medicines   Take over-the-counter and prescription medicines only as told by your health care provider.  Get a seasonal flu shot (influenza vaccine).  Avoid people who received a vaccine in the past 30 days if that vaccine contained a live version of the germ (live vaccine). You should not get a live vaccine. Common live vaccines are polio, MMR, chicken pox, and shingles vaccines. Eating and drinking  Do not share food utensils.  Do not eat unpasteurized foods.  Do not eat raw or undercooked meat, eggs, or seafood.  Do not eat unwashed, raw fruits or vegetables. Lifestyle  Avoid exposure to groups of people or children.  Avoid being around people who are sick.  Avoid being around dirt or dust, such as in construction areas or gardens.  Do not provide direct care for pets. Avoid animal droppings. Do not clean litter boxes and bird cages.  Do not have sex unless your health care provider has approved. Hygiene  Bathe daily.  Clean the area between the genitals and the anus (perineal area) after you urinate or have a bowel movement. If you are male, wipe from front to back.  Brush your teeth with a soft toothbrush before and  after meals.  Do not use a regular razor. Use an electric razor to remove hair.  Wash your hands often. Make sure others who come in contact with you also wash their hands. If soap and water are not available, use hand sanitizer. General instructions  Follow any precautions as told by your health care provider to reduce your risk for injury or infection.  Take actions to avoid cuts and burns. For example: ? Be cautious when you use knives. Always cut away from yourself. ? Keep knives in protective sheaths or guards when not in use. ? Use oven mitts when you cook with a hot stove, oven, or grill. ? Stand a safe distance away from open fires.  Do not use tampons, enemas, or rectal suppositories unless your health care provider has approved.  Keep all follow-up visits as told by your health care provider. This is important. Contact a health care provider if:  You have: ? A sore throat. ? A warm, red, or tender area on your skin. ? A cough. ? Frequent or painful urination. ? Vaginal discharge or itching.  You develop: ? Sores in your mouth or anus. ? Swollen lymph nodes. ? Red streaks on the skin. ? A rash. Get help right away if:  You have: ? A fever. ? Chills, or you start to shake.  You feel: ? Nauseous, or you vomit. ? Very fatigued. ? Short of breath. Summary  Neutropenia is a condition that occurs when you have a lower-than-normal level of a type of white blood cell (neutrophil) in your body.  This condition can occur if your body uses up or destroys neutrophils faster than your bone marrow can make them.  Treatment depends on the underlying cause and severity of your condition. Mild neutropenia may not require treatment.  Follow any precautions as told by your health care provider to reduce your risk for injury or infection. This information is not intended to replace advice given to you by your health care provider. Make sure you discuss any questions you have  with your health care provider. Document Revised: 02/14/2018 Document Reviewed: 02/14/2018 Elsevier Patient Education  [place]. Fluorouracil, 5-FU injection What is this medicine? FLUOROURACIL, 5-FU (flure oh YOOR a sil) is a chemotherapy drug. It slows the growth of cancer cells. This medicine is used to treat many types of cancer like breast cancer, colon or rectal cancer, pancreatic cancer, and stomach cancer. This medicine may be used for other purposes; ask your health care provider or pharmacist if you have questions. COMMON BRAND NAME(S): Adrucil What should I tell my health care provider before I take this medicine? They need to know if you have any of these conditions:  blood disorders  dihydropyrimidine dehydrogenase (DPD) deficiency  infection (especially a virus infection such as chickenpox, cold sores, or herpes)  kidney disease  liver disease  malnourished, poor nutrition  recent or ongoing radiation therapy  an unusual or allergic reaction to fluorouracil, other chemotherapy, other medicines, foods, dyes, or preservatives  pregnant or trying to get pregnant  breast-feeding How should I use this medicine? This drug is given as an infusion or injection into a vein. It is administered in a hospital or clinic by a specially trained health  care professional. Talk to your pediatrician regarding the use of this medicine in children. Special care may be needed. Overdosage: If you think you have taken too much of this medicine contact a poison control center or emergency room at once. NOTE: This medicine is only for you. Do not share this medicine with others. What if I miss a dose? It is important not to miss your dose. Call your doctor or health care professional if you are unable to keep an appointment. What may interact with this medicine?  allopurinol  cimetidine  dapsone  digoxin  hydroxyurea  leucovorin  levamisole  medicines for seizures  like ethotoin, fosphenytoin, phenytoin  medicines to increase blood counts like filgrastim, pegfilgrastim, sargramostim  medicines that treat or prevent blood clots like warfarin, enoxaparin, and dalteparin  methotrexate  metronidazole  pyrimethamine  some other chemotherapy drugs like busulfan, cisplatin, estramustine, vinblastine  trimethoprim  trimetrexate  vaccines Talk to your doctor or health care professional before taking any of these medicines:  acetaminophen  aspirin  ibuprofen  ketoprofen  naproxen This list may not describe all possible interactions. Give your health care provider a list of all the medicines, herbs, non-prescription drugs, or dietary supplements you use. Also tell them if you smoke, drink alcohol, or use illegal drugs. Some items may interact with your medicine. What should I watch for while using this medicine? Visit your doctor for checks on your progress. This drug may make you feel generally unwell. This is not uncommon, as chemotherapy can affect healthy cells as well as cancer cells. Report any side effects. Continue your course of treatment even though you feel ill unless your doctor tells you to stop. In some cases, you may be given additional medicines to help with side effects. Follow all directions for their use. Call your doctor or health care professional for advice if you get a fever, chills or sore throat, or other symptoms of a cold or flu. Do not treat yourself. This drug decreases your body's ability to fight infections. Try to avoid being around people who are sick. This medicine may increase your risk to bruise or bleed. Call your doctor or health care professional if you notice any unusual bleeding. Be careful brushing and flossing your teeth or using a toothpick because you may get an infection or bleed more easily. If you have any dental work done, tell your dentist you are receiving this medicine. Avoid taking products that  contain aspirin, acetaminophen, ibuprofen, naproxen, or ketoprofen unless instructed by your doctor. These medicines may hide a fever. Do not become pregnant while taking this medicine. Women should inform their doctor if they wish to become pregnant or think they might be pregnant. There is a potential for serious side effects to an unborn child. Talk to your health care professional or pharmacist for more information. Do not breast-feed an infant while taking this medicine. Men should inform their doctor if they wish to father a child. This medicine may lower sperm counts. Do not treat diarrhea with over the counter products. Contact your doctor if you have diarrhea that lasts more than 2 days or if it is severe and watery. This medicine can make you more sensitive to the sun. Keep out of the sun. If you cannot avoid being in the sun, wear protective clothing and use sunscreen. Do not use sun lamps or tanning beds/booths. What side effects may I notice from receiving this medicine? Side effects that you should report to your doctor or  health care professional as soon as possible:  allergic reactions like skin rash, itching or hives, swelling of the face, lips, or tongue  low blood counts - this medicine may decrease the number of white blood cells, red blood cells and platelets. You may be at increased risk for infections and bleeding.  signs of infection - fever or chills, cough, sore throat, pain or difficulty passing urine  signs of decreased platelets or bleeding - bruising, pinpoint red spots on the skin, black, tarry stools, blood in the urine  signs of decreased red blood cells - unusually weak or tired, fainting spells, lightheadedness  breathing problems  changes in vision  chest pain  mouth sores  nausea and vomiting  pain, swelling, redness at site where injected  pain, tingling, numbness in the hands or feet  redness, swelling, or sores on hands or feet  stomach  pain  unusual bleeding Side effects that usually do not require medical attention (report to your doctor or health care professional if they continue or are bothersome):  changes in finger or toe nails  diarrhea  dry or itchy skin  hair loss  headache  loss of appetite  sensitivity of eyes to the light  stomach upset  unusually teary eyes This list may not describe all possible side effects. Call your doctor for medical advice about side effects. You may report side effects to FDA at 1-800-FDA-1088. Where should I keep my medicine? This drug is given in a hospital or clinic and will not be stored at home. NOTE: This sheet is a summary. It may not cover all possible information. If you have questions about this medicine, talk to your doctor, pharmacist, or health care provider.  2020 Elsevier/Gold Standard (2007-09-04 13:53:16) Leucovorin injection What is this medicine? LEUCOVORIN (loo koe VOR in) is used to prevent or treat the harmful effects of some medicines. This medicine is used to treat anemia caused by a low amount of folic acid in the body. It is also used with 5-fluorouracil (5-FU) to treat colon cancer. This medicine may be used for other purposes; ask your health care provider or pharmacist if you have questions. What should I tell my health care provider before I take this medicine? They need to know if you have any of these conditions:  anemia from low levels of vitamin B-12 in the blood  an unusual or allergic reaction to leucovorin, folic acid, other medicines, foods, dyes, or preservatives  pregnant or trying to get pregnant  breast-feeding How should I use this medicine? This medicine is for injection into a muscle or into a vein. It is given by a health care professional in a hospital or clinic setting. Talk to your pediatrician regarding the use of this medicine in children. Special care may be needed. Overdosage: If you think you have taken too much  of this medicine contact a poison control center or emergency room at once. NOTE: This medicine is only for you. Do not share this medicine with others. What if I miss a dose? This does not apply. What may interact with this medicine?  capecitabine  fluorouracil  phenobarbital  phenytoin  primidone  trimethoprim-sulfamethoxazole This list may not describe all possible interactions. Give your health care provider a list of all the medicines, herbs, non-prescription drugs, or dietary supplements you use. Also tell them if you smoke, drink alcohol, or use illegal drugs. Some items may interact with your medicine. What should I watch for while using this medicine?  Your condition will be monitored carefully while you are receiving this medicine. This medicine may increase the side effects of 5-fluorouracil, 5-FU. Tell your doctor or health care professional if you have diarrhea or mouth sores that do not get better or that get worse. What side effects may I notice from receiving this medicine? Side effects that you should report to your doctor or health care professional as soon as possible:  allergic reactions like skin rash, itching or hives, swelling of the face, lips, or tongue  breathing problems  fever, infection  mouth sores  unusual bleeding or bruising  unusually weak or tired Side effects that usually do not require medical attention (report to your doctor or health care professional if they continue or are bothersome):  constipation or diarrhea  loss of appetite  nausea, vomiting This list may not describe all possible side effects. Call your doctor for medical advice about side effects. You may report side effects to FDA at 1-800-FDA-1088. Where should I keep my medicine? This drug is given in a hospital or clinic and will not be stored at home. NOTE: This sheet is a summary. It may not cover all possible information. If you have questions about this medicine, talk  to your doctor, pharmacist, or health care provider.  2020 Elsevier/Gold Standard (2007-11-05 16:50:29) Oxaliplatin Injection What is this medicine? OXALIPLATIN (ox AL i PLA tin) is a chemotherapy drug. It targets fast dividing cells, like cancer cells, and causes these cells to die. This medicine is used to treat cancers of the colon and rectum, and many other cancers. This medicine may be used for other purposes; ask your health care provider or pharmacist if you have questions. COMMON BRAND NAME(S): Eloxatin What should I tell my health care provider before I take this medicine? They need to know if you have any of these conditions:  heart disease  history of irregular heartbeat  liver disease  low blood counts, like white cells, platelets, or red blood cells  lung or breathing disease, like asthma  take medicines that treat or prevent blood clots  tingling of the fingers or toes, or other nerve disorder  an unusual or allergic reaction to oxaliplatin, other chemotherapy, other medicines, foods, dyes, or preservatives  pregnant or trying to get pregnant  breast-feeding How should I use this medicine? This drug is given as an infusion into a vein. It is administered in a hospital or clinic by a specially trained health care professional. Talk to your pediatrician regarding the use of this medicine in children. Special care may be needed. Overdosage: If you think you have taken too much of this medicine contact a poison control center or emergency room at once. NOTE: This medicine is only for you. Do not share this medicine with others. What if I miss a dose? It is important not to miss a dose. Call your doctor or health care professional if you are unable to keep an appointment. What may interact with this medicine? Do not take this medicine with any of the following medications:  cisapride  dronedarone  pimozide  thioridazine This medicine may also interact with the  following medications:  aspirin and aspirin-like medicines  certain medicines that treat or prevent blood clots like warfarin, apixaban, dabigatran, and rivaroxaban  cisplatin  cyclosporine  diuretics  medicines for infection like acyclovir, adefovir, amphotericin B, bacitracin, cidofovir, foscarnet, ganciclovir, gentamicin, pentamidine, vancomycin  NSAIDs, medicines for pain and inflammation, like ibuprofen or naproxen  other medicines that prolong  the QT interval (an abnormal heart rhythm)  pamidronate  zoledronic acid This list may not describe all possible interactions. Give your health care provider a list of all the medicines, herbs, non-prescription drugs, or dietary supplements you use. Also tell them if you smoke, drink alcohol, or use illegal drugs. Some items may interact with your medicine. What should I watch for while using this medicine? Your condition will be monitored carefully while you are receiving this medicine. You may need blood work done while you are taking this medicine. This medicine may make you feel generally unwell. This is not uncommon as chemotherapy can affect healthy cells as well as cancer cells. Report any side effects. Continue your course of treatment even though you feel ill unless your healthcare professional tells you to stop. This medicine can make you more sensitive to cold. Do not drink cold drinks or use ice. Cover exposed skin before coming in contact with cold temperatures or cold objects. When out in cold weather wear warm clothing and cover your mouth and nose to warm the air that goes into your lungs. Tell your doctor if you get sensitive to the cold. Do not become pregnant while taking this medicine or for 9 months after stopping it. Women should inform their health care professional if they wish to become pregnant or think they might be pregnant. Men should not father a child while taking this medicine and for 6 months after stopping it.  There is potential for serious side effects to an unborn child. Talk to your health care professional for more information. Do not breast-feed a child while taking this medicine or for 3 months after stopping it. This medicine has caused ovarian failure in some women. This medicine may make it more difficult to get pregnant. Talk to your health care professional if you are concerned about your fertility. This medicine has caused decreased sperm counts in some men. This may make it more difficult to father a child. Talk to your health care professional if you are concerned about your fertility. This medicine may increase your risk of getting an infection. Call your health care professional for advice if you get a fever, chills, or sore throat, or other symptoms of a cold or flu. Do not treat yourself. Try to avoid being around people who are sick. Avoid taking medicines that contain aspirin, acetaminophen, ibuprofen, naproxen, or ketoprofen unless instructed by your health care professional. These medicines may hide a fever. Be careful brushing or flossing your teeth or using a toothpick because you may get an infection or bleed more easily. If you have any dental work done, tell your dentist you are receiving this medicine. What side effects may I notice from receiving this medicine? Side effects that you should report to your doctor or health care professional as soon as possible:  allergic reactions like skin rash, itching or hives, swelling of the face, lips, or tongue  breathing problems  cough  low blood counts - this medicine may decrease the number of white blood cells, red blood cells, and platelets. You may be at increased risk for infections and bleeding  nausea, vomiting  pain, redness, or irritation at site where injected  pain, tingling, numbness in the hands or feet  signs and symptoms of bleeding such as bloody or black, tarry stools; red or dark brown urine; spitting up blood  or brown material that looks like coffee grounds; red spots on the skin; unusual bruising or bleeding from the eyes,  gums, or nose  signs and symptoms of a dangerous change in heartbeat or heart rhythm like chest pain; dizziness; fast, irregular heartbeat; palpitations; feeling faint or lightheaded; falls  signs and symptoms of infection like fever; chills; cough; sore throat; pain or trouble passing urine  signs and symptoms of liver injury like dark yellow or brown urine; general ill feeling or flu-like symptoms; light-colored stools; loss of appetite; nausea; right upper belly pain; unusually weak or tired; yellowing of the eyes or skin  signs and symptoms of low red blood cells or anemia such as unusually weak or tired; feeling faint or lightheaded; falls  signs and symptoms of muscle injury like dark urine; trouble passing urine or change in the amount of urine; unusually weak or tired; muscle pain; back pain Side effects that usually do not require medical attention (report to your doctor or health care professional if they continue or are bothersome):  changes in taste  diarrhea  gas  hair loss  loss of appetite  mouth sores This list may not describe all possible side effects. Call your doctor for medical advice about side effects. You may report side effects to FDA at 1-800-FDA-1088. Where should I keep my medicine? This drug is given in a hospital or clinic and will not be stored at home. NOTE: This sheet is a summary. It may not cover all possible information. If you have questions about this medicine, talk to your doctor, pharmacist, or health care provider.  2020 Elsevier/Gold Standard (2018-09-18 12:20:35)

## 2019-07-24 LAB — CEA: CEA: 13.8 ng/mL — ABNORMAL HIGH (ref 0.0–4.7)

## 2019-07-25 ENCOUNTER — Inpatient Hospital Stay: Payer: Medicare Other

## 2019-07-25 ENCOUNTER — Other Ambulatory Visit: Payer: Self-pay

## 2019-07-25 VITALS — BP 134/75 | HR 71 | Resp 18

## 2019-07-25 DIAGNOSIS — C786 Secondary malignant neoplasm of retroperitoneum and peritoneum: Secondary | ICD-10-CM

## 2019-07-25 DIAGNOSIS — Z5189 Encounter for other specified aftercare: Secondary | ICD-10-CM | POA: Diagnosis not present

## 2019-07-25 DIAGNOSIS — Z85038 Personal history of other malignant neoplasm of large intestine: Secondary | ICD-10-CM

## 2019-07-25 MED ORDER — SODIUM CHLORIDE 0.9% FLUSH
10.0000 mL | INTRAVENOUS | Status: DC | PRN
  Administered 2019-07-25: 10 mL
  Filled 2019-07-25: qty 10

## 2019-07-25 MED ORDER — HEPARIN SOD (PORK) LOCK FLUSH 100 UNIT/ML IV SOLN
500.0000 [IU] | Freq: Once | INTRAVENOUS | Status: AC | PRN
  Administered 2019-07-25: 500 [IU]
  Filled 2019-07-25: qty 5

## 2019-07-28 ENCOUNTER — Ambulatory Visit: Payer: Medicare Other

## 2019-07-28 ENCOUNTER — Other Ambulatory Visit: Payer: Medicare Other

## 2019-07-28 ENCOUNTER — Ambulatory Visit: Payer: Medicare Other | Admitting: Hematology and Oncology

## 2019-07-30 ENCOUNTER — Inpatient Hospital Stay: Payer: Medicare Other

## 2019-07-30 ENCOUNTER — Other Ambulatory Visit: Payer: Self-pay

## 2019-07-30 DIAGNOSIS — Z5189 Encounter for other specified aftercare: Secondary | ICD-10-CM | POA: Diagnosis not present

## 2019-07-30 DIAGNOSIS — Z85038 Personal history of other malignant neoplasm of large intestine: Secondary | ICD-10-CM

## 2019-07-30 DIAGNOSIS — C786 Secondary malignant neoplasm of retroperitoneum and peritoneum: Secondary | ICD-10-CM

## 2019-07-30 LAB — CBC WITH DIFFERENTIAL/PLATELET
Abs Immature Granulocytes: 0.03 10*3/uL (ref 0.00–0.07)
Basophils Absolute: 0 10*3/uL (ref 0.0–0.1)
Basophils Relative: 1 %
Eosinophils Absolute: 0.1 10*3/uL (ref 0.0–0.5)
Eosinophils Relative: 1 %
HCT: 39 % (ref 39.0–52.0)
Hemoglobin: 12.9 g/dL — ABNORMAL LOW (ref 13.0–17.0)
Immature Granulocytes: 1 %
Lymphocytes Relative: 40 %
Lymphs Abs: 2.1 10*3/uL (ref 0.7–4.0)
MCH: 30.9 pg (ref 26.0–34.0)
MCHC: 33.1 g/dL (ref 30.0–36.0)
MCV: 93.5 fL (ref 80.0–100.0)
Monocytes Absolute: 0.5 10*3/uL (ref 0.1–1.0)
Monocytes Relative: 9 %
Neutro Abs: 2.5 10*3/uL (ref 1.7–7.7)
Neutrophils Relative %: 48 %
Platelets: 197 10*3/uL (ref 150–400)
RBC: 4.17 MIL/uL — ABNORMAL LOW (ref 4.22–5.81)
RDW: 12.8 % (ref 11.5–15.5)
WBC: 5.2 10*3/uL (ref 4.0–10.5)
nRBC: 0 % (ref 0.0–0.2)

## 2019-07-30 LAB — COMPREHENSIVE METABOLIC PANEL
ALT: 32 U/L (ref 0–44)
AST: 29 U/L (ref 15–41)
Albumin: 3.7 g/dL (ref 3.5–5.0)
Alkaline Phosphatase: 56 U/L (ref 38–126)
Anion gap: 3 — ABNORMAL LOW (ref 5–15)
BUN: 18 mg/dL (ref 8–23)
CO2: 32 mmol/L (ref 22–32)
Calcium: 9.1 mg/dL (ref 8.9–10.3)
Chloride: 102 mmol/L (ref 98–111)
Creatinine, Ser: 0.93 mg/dL (ref 0.61–1.24)
GFR calc Af Amer: >60 mL/min (ref >60)
GFR calc non Af Amer: >60 mL/min (ref >60)
Glucose, Bld: 114 mg/dL — ABNORMAL HIGH (ref 70–99)
Potassium: 4 mmol/L (ref 3.5–5.1)
Sodium: 137 mmol/L (ref 135–145)
Total Bilirubin: 0.7 mg/dL (ref 0.3–1.2)
Total Protein: 7.1 g/dL (ref 6.5–8.1)

## 2019-07-30 NOTE — Progress Notes (Signed)
ANC of 2.5 today.  No need for granix injection.  Patient doing well without complaints.

## 2019-07-31 LAB — CEA: CEA: 11.9 ng/mL — ABNORMAL HIGH (ref 0.0–4.7)

## 2019-08-05 ENCOUNTER — Other Ambulatory Visit: Payer: Self-pay

## 2019-08-05 DIAGNOSIS — Z5111 Encounter for antineoplastic chemotherapy: Secondary | ICD-10-CM

## 2019-08-05 NOTE — Progress Notes (Signed)
[facility]  [address] [place], [place] [address] Phone: [phone]  Fax: [phone]   Clinic Day:  08/06/2019  Referring physician: Tracie Harrier, MD  Chief Complaint: Jared Gutierrez is a 79 y.o. male with metastaticcolon cancer who is seen for assessment prior to cycle #4 FOLFOX chemotherapy.  HPI: The patient was last seen in the medical oncology clinic on 07/23/2019. At that time, he was doing well.  He described loose stools and nausea for 1 day after his last pump disconnect. Weight was down 6 lbs. Hematocrit was 38.4, hemoglobin 12.6, MCV 93.7, platelets 166,000, WBC 4,100, ANC 1,900. CEA was 13.8.  He received cycle #10 FOLFOX chemotherapy.  During the interim, he has been well. He states that he had some mild nausea and diarrhea after day 1 of cycle #3.  The nausea only lasted one day, and was treated with Zofran. His diarrhea only lasted one day; he did not need any imodium.  He had 2 bowel movements. He notes a  little neuropathy in his fingers and toes, which is worse with coldness. He believes that he is handling his treatment well overall. He continues to have issues with his knees.   We discussed his slightly low WBC count today and trend in WBC since initiation of therapy.  I discussed the possibility to adding Neulasta to his treatment regimen. He understands that he would need to take Claritin to help prevent Neulasta induced bone pain with this treatment.    Past Medical History:  Diagnosis Date  . Arthritis    OSTEOARTHRITIS  . Cancer ([place])   . Cavitary lesion of lung    RIGHT LOWER LOBE  . Chicken pox   . Colon cancer ([place])   . History of kidney stones   . Hypertension   . Lipoma of colon   . Nephrolithiasis   . Nephrolithiasis   . Obesity   . Shingles   . Tubular adenoma of colon    multiple fragments    Past Surgical History:  Procedure Laterality Date  . COLON SURGERY    . COLONOSCOPY N/A 10/02/2014   Procedure: COLONOSCOPY;  Surgeon: Josefine Class, MD;  Location: [facility] ENDOSCOPY;  Service: Endoscopy;  Laterality: N/A;  . COLONOSCOPY WITH PROPOFOL N/A 02/16/2017   Procedure: COLONOSCOPY WITH PROPOFOL;  Surgeon: Manya Silvas, MD;  Location: [facility] ENDOSCOPY;  Service: Endoscopy;  Laterality: N/A;  . EUS N/A 04/17/2019   Procedure: FULL UPPER ENDOSCOPIC ULTRASOUND (EUS) RADIAL;  Surgeon: Holly Bodily, MD;  Location: [facility] ENDOSCOPY;  Service: Gastroenterology;  Laterality: N/A;  . KIDNEY STONE SURGERY    . PARTIAL COLECTOMY  10/17/2013  . PORTACATH PLACEMENT Right 06/13/2019   Procedure: INSERTION PORT-A-CATH;  Surgeon: Benjamine Sprague, DO;  Location: [facility] ORS;  Service: General;  Laterality: Right;    Family History  Problem Relation Age of Onset  . Cancer Mother   . Breast cancer Mother   . COPD Father     Social History:  reports that he has never smoked. He has never used smokeless tobacco. He reports current alcohol use. He reports that he does not use drugs. 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He is married and his wife's name is [name] [phone]). The patient is alone today.   Allergies: No Known Allergies  Current Medications: Current Outpatient Medications  Medication Sig Dispense Refill  . amLODipine (NORVASC) 2.5 MG tablet Take 2.5 mg by mouth  daily.     . aspirin EC 81 MG tablet Take 81 mg by mouth daily.    . Cholecalciferol (VITAMIN D) 125 MCG (5000 UT) CAPS Take 5,000 mg by mouth.    . olmesartan (BENICAR) 20 MG tablet Take 20 mg by mouth daily.    . ondansetron (ZOFRAN) 8 MG tablet Take 1 tablet (8 mg total) by mouth 2 (two) times daily as needed for refractory nausea / vomiting. Start on day 3 after chemotherapy. 30 tablet 1  . potassium chloride SA (KLOR-CON) 20 MEQ tablet Take 1 tablet (20 mEq total) by mouth daily. for 2 days only 10 tablet 0  . Probiotic Product (PROBIOTIC DAILY PO) Take 1 tablet by mouth daily.    [name]  HYDROcodone-acetaminophen (NORCO) 5-325 MG tablet Take 1 tablet by mouth every 6 (six) hours as needed for up to 3 doses for moderate pain. (Patient not taking: Reported on 06/24/2019) 6 tablet 0  . ibuprofen (ADVIL) 800 MG tablet Take 1 tablet (800 mg total) by mouth every 8 (eight) hours as needed for mild pain or moderate pain. (Patient not taking: Reported on 06/24/2019) 30 tablet 0  . lidocaine-prilocaine (EMLA) cream Apply to affected area once (Patient not taking: Reported on 08/06/2019) 30 g 3  . Nutritional Supplements (JUICE PLUS FIBRE PO) Take 3 tablets by mouth daily.     No current facility-administered medications for this visit.   Facility-Administered Medications Ordered in Other Visits  Medication Dose Route Frequency Provider Last Rate Last Admin  . heparin lock flush 100 unit/mL  500 Units Intravenous Once Kareen Hitsman C, MD      . sodium chloride flush (NS) 0.9 % injection 10 mL  10 mL Intravenous PRN Nolon Stalls C, MD   10 mL at 08/06/19 0914   Wt Readings from Last 3 Encounters:  08/06/19 278 lb (126.1 kg)  07/23/19 269 lb 8.2 oz (122.3 kg)  07/09/19 275 lb 0.4 oz (124.7 kg)    Review of Systems  Constitutional: Positive for malaise/fatigue. Negative for chills, diaphoresis, fever and weight loss (up 9 lbs).       Feels "pretty good".  HENT: Negative for congestion, ear pain, nosebleeds, sinus pain, sore throat and tinnitus.   Eyes: Negative.  Negative for blurred vision and double vision.  Respiratory: Negative.  Negative for cough, sputum production and shortness of breath.   Cardiovascular: Negative.  Negative for chest pain, palpitations and leg swelling.  Gastrointestinal: Positive for diarrhea (transient) and nausea (well managed with ondansetron). Negative for abdominal pain, blood in stool, constipation, heartburn, melena and vomiting.  Genitourinary: Negative.  Negative for dysuria, frequency, hematuria and urgency.  Musculoskeletal: Positive for joint  pain (chronic bilateral knee pain). Negative for back pain and falls.  Skin: Negative.  Negative for rash.  Neurological: Negative.  Negative for weakness and headaches.  Endo/Heme/Allergies: Negative.  Does not bruise/bleed easily.  Psychiatric/Behavioral: Negative.  Negative for depression. The patient is not nervous/anxious and does not have insomnia.    Performance status (ECOG): 0-1  Vitals Blood pressure 129/78, pulse 78, temperature 98.5 F (36.9 C), temperature source Tympanic, resp. rate 18, weight 278 lb (126.1 kg).   Physical Exam  Constitutional: He is oriented to person, place, and time. Vital signs are normal. He appears well-developed and well-nourished.  He has a cane at his side.  HENT:  Head: Normocephalic and atraumatic.  Short gray hair.  Mask.  Eyes: Pupils are equal, round, and reactive to light.  Neck: No  JVD present.  Cardiovascular: Normal rate and regular rhythm.  No murmur heard. Pulmonary/Chest: Effort normal and breath sounds normal. He has no wheezes.  Abdominal: Soft. Bowel sounds are normal. He exhibits no distension and no mass. There is no abdominal tenderness. There is no rebound and no guarding.  Musculoskeletal:        General: Edema (chronic lower extremity changes) present. No tenderness. Normal range of motion.     Cervical back: Normal range of motion.  Lymphadenopathy:       Head (right side): No preauricular, no posterior auricular and no occipital adenopathy present.       Head (left side): No preauricular, no posterior auricular and no occipital adenopathy present.    He has no cervical adenopathy.    He has no axillary adenopathy.       Right: No inguinal and no supraclavicular adenopathy present.       Left: No inguinal and no supraclavicular adenopathy present.  Neurological: He is alert and oriented to person, place, and time.  Skin: Skin is warm and dry. No rash noted. No erythema. No pallor.  Psychiatric: He has a normal mood and  affect. His behavior is normal. Judgment and thought content normal.  Nursing note and vitals reviewed.    Infusion on 08/06/2019  Component Date Value Ref Range Status  . Sodium 08/06/2019 137  135 - 145 mmol/L Final  . Potassium 08/06/2019 3.6  3.5 - 5.1 mmol/L Final  . Chloride 08/06/2019 104  98 - 111 mmol/L Final  . CO2 08/06/2019 26  22 - 32 mmol/L Final  . Glucose, Bld 08/06/2019 183* 70 - 99 mg/dL Final   Glucose reference range applies only to samples taken after fasting for at least 8 hours.  . BUN 08/06/2019 12  8 - 23 mg/dL Final  . Creatinine, Ser 08/06/2019 0.75  0.61 - 1.24 mg/dL Final  . Calcium 08/06/2019 9.2  8.9 - 10.3 mg/dL Final  . Total Protein 08/06/2019 6.2* 6.5 - 8.1 g/dL Final  . Albumin 08/06/2019 3.3* 3.5 - 5.0 g/dL Final  . AST 08/06/2019 39  15 - 41 U/L Final  . ALT 08/06/2019 34  0 - 44 U/L Final  . Alkaline Phosphatase 08/06/2019 53  38 - 126 U/L Final  . Total Bilirubin 08/06/2019 0.8  0.3 - 1.2 mg/dL Final  . GFR calc non Af Amer 08/06/2019 >60  >60 mL/min Final  . GFR calc Af Amer 08/06/2019 >60  >60 mL/min Final  . Anion gap 08/06/2019 7  5 - 15 Final   Performed at [facility], [address] [place], [place] [address]  . WBC 08/06/2019 3.4* 4.0 - 10.5 K/uL Final  . RBC 08/06/2019 3.84* 4.22 - 5.81 MIL/uL Final  . Hemoglobin 08/06/2019 12.0* 13.0 - 17.0 g/dL Final  . HCT 08/06/2019 36.2* 39.0 - 52.0 % Final  . MCV 08/06/2019 94.3  80.0 - 100.0 fL Final  . MCH 08/06/2019 31.3  26.0 - 34.0 pg Final  . MCHC 08/06/2019 33.1  30.0 - 36.0 g/dL Final  . RDW 08/06/2019 13.6  11.5 - 15.5 % Final  . Platelets 08/06/2019 143* 150 - 400 K/uL Final  . nRBC 08/06/2019 0.0  0.0 - 0.2 % Final  . Neutrophils Relative % 08/06/2019 46  % Final  . Neutro Abs 08/06/2019 1.6* 1.7 - 7.7 K/uL Final  . Lymphocytes Relative 08/06/2019 41  % Final  . Lymphs Abs 08/06/2019 1.4  0.7 - 4.0 K/uL  Final  . Monocytes Relative 08/06/2019 11  % Final  .  Monocytes Absolute 08/06/2019 0.4  0.1 - 1.0 K/uL Final  . Eosinophils Relative 08/06/2019 1  % Final  . Eosinophils Absolute 08/06/2019 0.0  0.0 - 0.5 K/uL Final  . Basophils Relative 08/06/2019 1  % Final  . Basophils Absolute 08/06/2019 0.0  0.0 - 0.1 K/uL Final  . Immature Granulocytes 08/06/2019 0  % Final  . Abs Immature Granulocytes 08/06/2019 0.00  0.00 - 0.07 K/uL Final   Performed at [facility], [address] [place], [place] [address]  . Magnesium 08/06/2019 1.7  1.7 - 2.4 mg/dL Final   Performed at [facility], [address] [place], [place] [address]    Assessment:  Jared Gutierrez is a 79 y.o. male withmetastatic colon cancer. He was diagnosed with stage I colon cancers/p transverse colectomy on 10/17/2013. 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 Tumor extended into the submucosa. Margins were negative. There was lymphovascular invasion. 14 lymph nodes were negative.Pathologic stagewas T1 N0.  Colonoscopyon 10/02/2014 noted several 3 mm polyps and an 8 mm polyp in the cecum and transverse colon. Pathology revealed tubular adenomas negative for high-grade dysplasia and malignancy. Colonoscopyon 02/16/2017 revealed 2 diminutive polyps in the descending colon. Pathology revealed tubular adenomas without dysplasia or malignancy.  CEAhas been followed: 3.1 on 04/27/2014, 2.3 on 11/11/2014, 3.0 on 05/12/2015, 2.7 on 11/10/2015, 3.2 on 05/10/2016, 2.8 on 11/01/2016, 3.3 on 04/25/2017, 3.1 on 10/31/2017, 4.0 on 05/01/2018, 7.0on 11/28/2018, 7.4 on 12/13/2018, 10.5 on 03/25/2019, 17.8 on 06/25/2019, 16.2 on 07/09/2019, 13.8 on 07/23/2019, and 11.9 on 07/30/2019.  Chest, abdomen and pelvis CTon 12/20/2018 revealed postoperative findings of transverse colon resection without evidence of recurrent mass, definite lymphadenopathy, or distant  metastatic disease to explain rising CEA. Therewereprominent sub-centimeter retroperitoneal and iliac lymph nodes, uncertain significance. There were postoperative findings about the left renal hilum, of uncertain nature, with a broad-based postoperative fat containing left-sided lumbar hernia.There was bibasilar bronchiectasis and scarring with multiple post infectious or inflammatory pneumatoceles, unchanged in comparison to CT date 06/23/2011.  PET scanon 04/02/2019 revealed a2.4 x 2.3 cm (SUV 11)hypermetabolic soft tissue density caudal and anterior to the pancreatic neckfavored to represent isolated peritoneal or nodal metastasis in the setting of prior transverse colonic resection (expected primary drainage). Although this was immediately adjacent to the pancreas, a fat plane was maintained, arguing strongly against a pancreatic primary. Otherwise,there wasno evidence of hypermetabolic metastasis.  Upper endoscopic ultrasoundon 04/17/2019 revealeda normal esophagus, stomach, duodenum, and pancreas.There was a 2.4 x 2.4 cm irregularmassin the retroperitoneum adjacent to, but not involving the pancreatic neck. FNA and core needle biopsy were performed. Pathologyrevealed adenocarcinoma compatible with a metastatic lesion of colorectal origin. Tumor cells were positive for CK20 and CDX2andnegative for CK7.  Omniseqon 05/15/2019 revealed + KRAS and TP53. Negative results included BRAF V600E, Her2, NRAS, NTRK, PD-L1 (<1%), and TMB 8.7/Mb (intermediate).  MMR testing from his colon resection on 10/16/2013 was intact with a low probability of MSI-H.  He is s/p 3 cycle of FOLFOX chemotherapy (07/09/2019 - 07/23/2019).  He has chronic right lower extremity edema. Right lower extremity duplex on 12/24/2018 revealed no DVT. He has a small right Baker's cyst.  He has received his COVID-19 vaccine.  Symptomatically, he is doing well.  He notes a transient cold neuropathy.  He  denies any numbness or tingling today.  Exam is stable.  WBC 3400 (ANC 1600).  Plan: 1.   Labs today: CBC with diff, CMP, Mg. 2.   Metastaticcolon cancer Clinically, he is doing well. He initially presented with stage I disease s/p resection. PET scan on 04/02/2019 revealeda2.4 x 2.3 cm (SUV 11)soft tissue density caudal and anterior to the pancreatic neck. 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 Lesion is not resectable.  NGS revealed no targetable mutation.  MMR on original resection revealed a a low probability of MSI-H.  He declined Avastin.  He is s/p 3 cycle of FOLFOX chemotherapy.   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   Discuss counts today.  Concern for possible neutropenia with this cycle and consideration for the use of Neulasta.    Information provided on Neulasta.  Labs reviewed.  Cycle #4 FOLFOX today.  Discuss symptom management.  He has antiemetics at home to use on a prn bases.  Interventions are adequate.       3.   Hypokalemia, resolved  Potassium 3.6.  Continue to monitor. 4.   Weight Loss  Weight has improved since last cycle.  Continue to monitor. 5.   Cycle #4 FOLFOX chemotherapy therapy. 6.   RTC in 2 days for pump disconnect and Neulasta. 7.   RTC in 1 week for labs (CBC with diff). 8.   RTC in 2 weeks for MD assessment, labs (CBC with diff, CMP, Mg, CEA), and cycle #5 FOLFOX chemotherapy.  I discussed the assessment and treatment plan with the patient.  The patient was provided an opportunity to ask questions and all were answered.  The patient agreed with the plan and demonstrated an understanding of the instructions.  The patient was advised to call back if the symptoms worsen or if the condition fails to improve as anticipated.   Dominick Morella C. Mike Gip, MD, PhD      08/06/2019, 10:07 AM   I, Jacqualyn Posey, am acting as a Education administrator for [employer]. Mike Gip, MD.   I, Alfonsa Vaile C. Mike Gip, MD, have reviewed the above documentation for accuracy and completeness, and I agree with the above.

## 2019-08-06 ENCOUNTER — Other Ambulatory Visit: Payer: Self-pay

## 2019-08-06 ENCOUNTER — Inpatient Hospital Stay: Payer: Medicare Other

## 2019-08-06 ENCOUNTER — Inpatient Hospital Stay (HOSPITAL_BASED_OUTPATIENT_CLINIC_OR_DEPARTMENT_OTHER): Payer: Medicare Other | Admitting: Hematology and Oncology

## 2019-08-06 ENCOUNTER — Encounter: Payer: Self-pay | Admitting: Hematology and Oncology

## 2019-08-06 VITALS — BP 148/89 | HR 85 | Temp 98.0°F | Resp 20

## 2019-08-06 VITALS — BP 129/78 | HR 78 | Temp 98.5°F | Resp 18 | Wt 278.0 lb

## 2019-08-06 DIAGNOSIS — Z5189 Encounter for other specified aftercare: Secondary | ICD-10-CM | POA: Diagnosis not present

## 2019-08-06 DIAGNOSIS — G608 Other hereditary and idiopathic neuropathies: Secondary | ICD-10-CM | POA: Diagnosis not present

## 2019-08-06 DIAGNOSIS — Z5111 Encounter for antineoplastic chemotherapy: Secondary | ICD-10-CM | POA: Diagnosis not present

## 2019-08-06 DIAGNOSIS — C786 Secondary malignant neoplasm of retroperitoneum and peritoneum: Secondary | ICD-10-CM | POA: Diagnosis not present

## 2019-08-06 DIAGNOSIS — Z85038 Personal history of other malignant neoplasm of large intestine: Secondary | ICD-10-CM

## 2019-08-06 DIAGNOSIS — C184 Malignant neoplasm of transverse colon: Secondary | ICD-10-CM

## 2019-08-06 LAB — CBC WITH DIFFERENTIAL/PLATELET
Abs Immature Granulocytes: 0 10*3/uL (ref 0.00–0.07)
Basophils Absolute: 0 10*3/uL (ref 0.0–0.1)
Basophils Relative: 1 %
Eosinophils Absolute: 0 10*3/uL (ref 0.0–0.5)
Eosinophils Relative: 1 %
HCT: 36.2 % — ABNORMAL LOW (ref 39.0–52.0)
Hemoglobin: 12 g/dL — ABNORMAL LOW (ref 13.0–17.0)
Immature Granulocytes: 0 %
Lymphocytes Relative: 41 %
Lymphs Abs: 1.4 10*3/uL (ref 0.7–4.0)
MCH: 31.3 pg (ref 26.0–34.0)
MCHC: 33.1 g/dL (ref 30.0–36.0)
MCV: 94.3 fL (ref 80.0–100.0)
Monocytes Absolute: 0.4 10*3/uL (ref 0.1–1.0)
Monocytes Relative: 11 %
Neutro Abs: 1.6 10*3/uL — ABNORMAL LOW (ref 1.7–7.7)
Neutrophils Relative %: 46 %
Platelets: 143 10*3/uL — ABNORMAL LOW (ref 150–400)
RBC: 3.84 MIL/uL — ABNORMAL LOW (ref 4.22–5.81)
RDW: 13.6 % (ref 11.5–15.5)
WBC: 3.4 10*3/uL — ABNORMAL LOW (ref 4.0–10.5)
nRBC: 0 % (ref 0.0–0.2)

## 2019-08-06 LAB — COMPREHENSIVE METABOLIC PANEL
ALT: 34 U/L (ref 0–44)
AST: 39 U/L (ref 15–41)
Albumin: 3.3 g/dL — ABNORMAL LOW (ref 3.5–5.0)
Alkaline Phosphatase: 53 U/L (ref 38–126)
Anion gap: 7 (ref 5–15)
BUN: 12 mg/dL (ref 8–23)
CO2: 26 mmol/L (ref 22–32)
Calcium: 9.2 mg/dL (ref 8.9–10.3)
Chloride: 104 mmol/L (ref 98–111)
Creatinine, Ser: 0.75 mg/dL (ref 0.61–1.24)
GFR calc Af Amer: >60 mL/min (ref >60)
GFR calc non Af Amer: >60 mL/min (ref >60)
Glucose, Bld: 183 mg/dL — ABNORMAL HIGH (ref 70–99)
Potassium: 3.6 mmol/L (ref 3.5–5.1)
Sodium: 137 mmol/L (ref 135–145)
Total Bilirubin: 0.8 mg/dL (ref 0.3–1.2)
Total Protein: 6.2 g/dL — ABNORMAL LOW (ref 6.5–8.1)

## 2019-08-06 LAB — MAGNESIUM: Magnesium: 1.7 mg/dL (ref 1.7–2.4)

## 2019-08-06 MED ORDER — SODIUM CHLORIDE 0.9 % IV SOLN
10.0000 mg | Freq: Once | INTRAVENOUS | Status: AC
  Administered 2019-08-06: 10 mg via INTRAVENOUS
  Filled 2019-08-06: qty 10

## 2019-08-06 MED ORDER — LEUCOVORIN CALCIUM INJECTION 350 MG
1000.0000 mg | Freq: Once | INTRAVENOUS | Status: AC
  Administered 2019-08-06: 1000 mg via INTRAVENOUS
  Filled 2019-08-06: qty 50

## 2019-08-06 MED ORDER — PALONOSETRON HCL INJECTION 0.25 MG/5ML
0.2500 mg | Freq: Once | INTRAVENOUS | Status: AC
  Administered 2019-08-06: 0.25 mg via INTRAVENOUS

## 2019-08-06 MED ORDER — LEUCOVORIN CALCIUM INJECTION 350 MG
1000.0000 mg | Freq: Once | INTRAVENOUS | Status: DC
  Filled 2019-08-06: qty 50

## 2019-08-06 MED ORDER — OXALIPLATIN CHEMO INJECTION 100 MG/20ML
200.0000 mg | Freq: Once | INTRAVENOUS | Status: AC
  Administered 2019-08-06: 200 mg via INTRAVENOUS
  Filled 2019-08-06: qty 40

## 2019-08-06 MED ORDER — HEPARIN SOD (PORK) LOCK FLUSH 100 UNIT/ML IV SOLN
500.0000 [IU] | Freq: Once | INTRAVENOUS | Status: DC
  Filled 2019-08-06: qty 5

## 2019-08-06 MED ORDER — FLUOROURACIL CHEMO INJECTION 2.5 GM/50ML
1000.0000 mg | Freq: Once | INTRAVENOUS | Status: AC
  Administered 2019-08-06: 1000 mg via INTRAVENOUS
  Filled 2019-08-06: qty 20

## 2019-08-06 MED ORDER — SODIUM CHLORIDE 0.9% FLUSH
10.0000 mL | INTRAVENOUS | Status: DC | PRN
  Administered 2019-08-06: 10 mL via INTRAVENOUS
  Filled 2019-08-06: qty 10

## 2019-08-06 MED ORDER — SODIUM CHLORIDE 0.9 % IV SOLN
5900.0000 mg | INTRAVENOUS | Status: DC
  Administered 2019-08-06: 14:00:00 5900 mg via INTRAVENOUS
  Filled 2019-08-06: qty 118

## 2019-08-06 MED ORDER — DEXTROSE 5 % IV SOLN
Freq: Once | INTRAVENOUS | Status: AC
  Filled 2019-08-06: qty 250

## 2019-08-06 NOTE — Progress Notes (Signed)
Patient states that he has some numbness, tingling, and sensitivity to cold in his fingers which started when he began treatment. He has had nausea which he takes the Zofran and he says that it helps. He also reports occasional diarrhea that last for a day or two.

## 2019-08-06 NOTE — Patient Instructions (Signed)

## 2019-08-08 ENCOUNTER — Inpatient Hospital Stay: Payer: Medicare Other

## 2019-08-08 ENCOUNTER — Other Ambulatory Visit: Payer: Self-pay | Admitting: Hematology and Oncology

## 2019-08-08 VITALS — BP 124/76 | HR 75 | Temp 98.5°F | Resp 20

## 2019-08-08 DIAGNOSIS — Z5189 Encounter for other specified aftercare: Secondary | ICD-10-CM | POA: Diagnosis not present

## 2019-08-08 DIAGNOSIS — C786 Secondary malignant neoplasm of retroperitoneum and peritoneum: Secondary | ICD-10-CM

## 2019-08-08 DIAGNOSIS — Z85038 Personal history of other malignant neoplasm of large intestine: Secondary | ICD-10-CM

## 2019-08-08 MED ORDER — PEGFILGRASTIM INJECTION 6 MG/0.6ML ~~LOC~~
6.0000 mg | PREFILLED_SYRINGE | Freq: Once | SUBCUTANEOUS | Status: AC
  Administered 2019-08-08: 6 mg via SUBCUTANEOUS
  Filled 2019-08-08: qty 0.6

## 2019-08-08 MED ORDER — SODIUM CHLORIDE 0.9% FLUSH
10.0000 mL | INTRAVENOUS | Status: DC | PRN
  Administered 2019-08-08: 14:00:00 10 mL via INTRAVENOUS
  Filled 2019-08-08: qty 10

## 2019-08-08 MED ORDER — HEPARIN SOD (PORK) LOCK FLUSH 100 UNIT/ML IV SOLN
500.0000 [IU] | Freq: Once | INTRAVENOUS | Status: AC
  Administered 2019-08-08: 500 [IU] via INTRAVENOUS
  Filled 2019-08-08: qty 5

## 2019-08-08 NOTE — Patient Instructions (Addendum)

## 2019-08-13 ENCOUNTER — Inpatient Hospital Stay: Payer: Medicare Other

## 2019-08-13 DIAGNOSIS — C184 Malignant neoplasm of transverse colon: Secondary | ICD-10-CM

## 2019-08-13 DIAGNOSIS — Z5189 Encounter for other specified aftercare: Secondary | ICD-10-CM | POA: Diagnosis not present

## 2019-08-13 LAB — CBC WITH DIFFERENTIAL/PLATELET
Abs Immature Granulocytes: 0.11 10*3/uL — ABNORMAL HIGH (ref 0.00–0.07)
Basophils Absolute: 0 10*3/uL (ref 0.0–0.1)
Basophils Relative: 0 %
Eosinophils Absolute: 0.1 10*3/uL (ref 0.0–0.5)
Eosinophils Relative: 1 %
HCT: 40.2 % (ref 39.0–52.0)
Hemoglobin: 13.1 g/dL (ref 13.0–17.0)
Immature Granulocytes: 1 %
Lymphocytes Relative: 19 %
Lymphs Abs: 3 10*3/uL (ref 0.7–4.0)
MCH: 31.1 pg (ref 26.0–34.0)
MCHC: 32.6 g/dL (ref 30.0–36.0)
MCV: 95.5 fL (ref 80.0–100.0)
Monocytes Absolute: 0.9 10*3/uL (ref 0.1–1.0)
Monocytes Relative: 6 %
Neutro Abs: 11.5 10*3/uL — ABNORMAL HIGH (ref 1.7–7.7)
Neutrophils Relative %: 73 %
Platelets: 165 10*3/uL (ref 150–400)
RBC: 4.21 MIL/uL — ABNORMAL LOW (ref 4.22–5.81)
RDW: 14 % (ref 11.5–15.5)
WBC: 15.6 10*3/uL — ABNORMAL HIGH (ref 4.0–10.5)
nRBC: 0.1 % (ref 0.0–0.2)

## 2019-08-13 NOTE — Progress Notes (Signed)
Pharmacist Chemotherapy Monitoring - Follow Up Assessment    I verify that I have reviewed each item in the below checklist:  . Regimen for the patient is scheduled for the appropriate day and plan matches scheduled date. [name] Appropriate non-routine labs are ordered dependent on drug ordered. . If applicable, additional medications reviewed and ordered per protocol based on lifetime cumulative doses and/or treatment regimen.   Plan for follow-up and/or issues identified: No . I-vent associated with next due treatment: No . MD and/or nursing notified: No  Jared Gutierrez K 08/13/2019 7:40 AM

## 2019-08-14 ENCOUNTER — Inpatient Hospital Stay: Payer: Medicare Other | Attending: Nurse Practitioner | Admitting: Nurse Practitioner

## 2019-08-14 ENCOUNTER — Other Ambulatory Visit: Payer: Self-pay

## 2019-08-14 ENCOUNTER — Telehealth: Payer: Self-pay | Admitting: *Deleted

## 2019-08-14 VITALS — BP 111/57 | HR 97 | Temp 97.6°F | Resp 20

## 2019-08-14 DIAGNOSIS — Z7982 Long term (current) use of aspirin: Secondary | ICD-10-CM | POA: Diagnosis not present

## 2019-08-14 DIAGNOSIS — Z5111 Encounter for antineoplastic chemotherapy: Secondary | ICD-10-CM | POA: Insufficient documentation

## 2019-08-14 DIAGNOSIS — Z5189 Encounter for other specified aftercare: Secondary | ICD-10-CM | POA: Diagnosis not present

## 2019-08-14 DIAGNOSIS — M791 Myalgia, unspecified site: Secondary | ICD-10-CM | POA: Diagnosis not present

## 2019-08-14 DIAGNOSIS — L03115 Cellulitis of right lower limb: Secondary | ICD-10-CM | POA: Diagnosis not present

## 2019-08-14 DIAGNOSIS — C786 Secondary malignant neoplasm of retroperitoneum and peritoneum: Secondary | ICD-10-CM | POA: Insufficient documentation

## 2019-08-14 DIAGNOSIS — M7989 Other specified soft tissue disorders: Secondary | ICD-10-CM | POA: Diagnosis not present

## 2019-08-14 DIAGNOSIS — Z791 Long term (current) use of non-steroidal anti-inflammatories (NSAID): Secondary | ICD-10-CM | POA: Insufficient documentation

## 2019-08-14 DIAGNOSIS — M898X9 Other specified disorders of bone, unspecified site: Secondary | ICD-10-CM

## 2019-08-14 DIAGNOSIS — C184 Malignant neoplasm of transverse colon: Secondary | ICD-10-CM | POA: Diagnosis present

## 2019-08-14 DIAGNOSIS — E669 Obesity, unspecified: Secondary | ICD-10-CM | POA: Diagnosis not present

## 2019-08-14 DIAGNOSIS — I10 Essential (primary) hypertension: Secondary | ICD-10-CM | POA: Diagnosis not present

## 2019-08-14 DIAGNOSIS — R600 Localized edema: Secondary | ICD-10-CM | POA: Diagnosis not present

## 2019-08-14 DIAGNOSIS — G629 Polyneuropathy, unspecified: Secondary | ICD-10-CM | POA: Insufficient documentation

## 2019-08-14 DIAGNOSIS — L03031 Cellulitis of right toe: Secondary | ICD-10-CM | POA: Diagnosis not present

## 2019-08-14 DIAGNOSIS — Z79899 Other long term (current) drug therapy: Secondary | ICD-10-CM | POA: Insufficient documentation

## 2019-08-14 NOTE — Telephone Encounter (Signed)
Jared Gutierrez called reporting that patient is having swelling in bilateral feet, but his left foot/toes are very swollen and painful to touch. She is asking what to do. Please advise

## 2019-08-14 NOTE — Progress Notes (Signed)
[facility]  [phone] [phone]  Patient Care Team: Tracie Harrier, MD as PCP - General (Internal Medicine) Tracie Harrier, MD (Internal Medicine) Josefine Class, MD as Referring Physician (Gastroenterology) Clent Jacks, RN as Oncology Nurse Navigator   Name of the patient: Jared Gutierrez  OX:[id number]  [birth year]   Date of visit: 08/14/19  Diagnosis- Colon Cancer  Chief complaint/ Reason for visit- Myalgias & Arthalgias  Heme/Onc history:  Oncology History  History of colon cancer, stage I  09/23/2013 Pathology Results   Part A: TRANSVERSE COLON POLYP HOT SNARE:  - TUBULOVILLOUS ADENOMA, SEE COMMENT.  - NEGATIVE FOR HIGH GRADE DYSPLASIA AND MALIGNANCY.  .  Part B: SPLENIC FLEXURE MASS COLD BIOPSY:  - SUPERFICIAL SAMPLING OF A TUBULOVILLOUS ADENOMA WITH AT LEAST  HIGH GRADE DYSPLASIA, SEE COMMENT.    09/23/2013 Initial Diagnosis   His initial cancer was discovered through screening colonoscopy   10/17/2013 Pathology Results   TRANSVERSE COLON, RESECTION:  - INVASIVE ADENOCARCINOMA ARISING IN A 4.6 CM TUBULOVILLOUS  ADENOMA WITH HIGH GRADE DYSPLASIA (MALIGNANT POLYP).  - SEE SUMMARY BELOW.  [name]  ONCOLOGY SUMMARY: COLON AND RECTUM, RESECTION, AJCC 7TH EDITION  Specimen: transverse colon  Procedure: transverse colon resection  Tumor site: splenic flexure  Tumor size: 1.0 cm (invasive component)  Macroscopic tumor perforation: not specified  Histologic type: invasive adenocarcinoma  Histologic grade: moderately differentiated  Microscopic tumor extension: into submucosa  Margins:    Proximal margin: negative    Distal margin: negative    Circumferential (radial) or mesenteric margin: negative    If all margins uninvolved by invasive carcinoma:    Distance of invasive carcinoma from closest margin: 7.5 cm  to distal margin  Treatment effect: not applicable   Lymph-vascular invasion: present  Perineural invasion: not identified  Tumor deposits (discontinuous extramural extension): not  identified  Pathologic staging:    Primary tumor: pT1    Regional lymph nodes: pN0       Number of nodes examined: 14       Number of nodes involved: 0    10/17/2013 Surgery   He underwent transverse colectomy   10/02/2014 Procedure   He has surveillance colonoscopy. 4 colon polyps were removed   10/02/2014 Pathology Results   [id number] colonoscopy pathology A. CECAL POLYPS; HOT SNARE AND COLD BIOPSY:  - TUBULAR ADENOMA, MULTIPLE FRAGMENTS.  - NEGATIVE FOR HIGH-GRADE DYSPLASIA AND MALIGNANCY.   B. COLON POLYP, TRANSVERSE; COLD BIOPSY:  - TUBULAR ADENOMA.  - NEGATIVE FOR HIGH-GRADE DYSPLASIA AND MALIGNANCY.     06/25/2019 -  Chemotherapy   The patient had palonosetron (ALOXI) injection 0.25 mg, 0.25 mg, Intravenous,  Once, 4 of 12 cycles Administration: 0.25 mg (06/25/2019), 0.25 mg (07/09/2019), 0.25 mg (07/23/2019), 0.25 mg (08/06/2019) pegfilgrastim (NEULASTA) injection 6 mg, 6 mg, Subcutaneous, Once, 1 of 9 cycles Administration: 6 mg (08/08/2019) leucovorin 1,000 mg in dextrose 5 % 250 mL infusion, 840 mg, Intravenous,  Once, 4 of 12 cycles Administration: 1,000 mg (07/09/2019), 1,000 mg (07/23/2019) oxaliplatin (ELOXATIN) 200 mg in dextrose 5 % 500 mL chemo infusion, 180 mg, Intravenous,  Once, 4 of 12 cycles Administration: 200 mg (06/25/2019), 200 mg (07/09/2019), 200 mg (07/23/2019), 200 mg (08/06/2019) fluorouracil (ADRUCIL) chemo injection 1,000 mg, 850 mg, Intravenous,  Once, 4 of 12 cycles Administration: 1,000 mg (06/25/2019), 1,000 mg (07/09/2019), 1,000 mg (07/23/2019), 1,000 mg (08/06/2019) fluorouracil (ADRUCIL) 5,900 mg in sodium chloride 0.9 % 132 mL chemo infusion, 5,050 mg, Intravenous,  1 Day/Dose, 4 of 12 cycles Administration: 5,900 mg (06/25/2019), 5,900 mg (07/09/2019), 5,900 mg (07/23/2019), 5,900 mg (08/06/2019)  for  chemotherapy treatment.    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)  05/11/2019 Initial Diagnosis   Metastasis to peritoneal cavity ([place])   06/25/2019 -  Chemotherapy   The patient had palonosetron (ALOXI) injection 0.25 mg, 0.25 mg, Intravenous,  Once, 4 of 12 cycles Administration: 0.25 mg (06/25/2019), 0.25 mg (07/09/2019), 0.25 mg (07/23/2019), 0.25 mg (08/06/2019) pegfilgrastim (NEULASTA) injection 6 mg, 6 mg, Subcutaneous, Once, 1 of 9 cycles Administration: 6 mg (08/08/2019) leucovorin 1,000 mg in dextrose 5 % 250 mL infusion, 840 mg, Intravenous,  Once, 4 of 12 cycles Administration: 1,000 mg (07/09/2019), 1,000 mg (07/23/2019) oxaliplatin (ELOXATIN) 200 mg in dextrose 5 % 500 mL chemo infusion, 180 mg, Intravenous,  Once, 4 of 12 cycles Administration: 200 mg (06/25/2019), 200 mg (07/09/2019), 200 mg (07/23/2019), 200 mg (08/06/2019) fluorouracil (ADRUCIL) chemo injection 1,000 mg, 850 mg, Intravenous,  Once, 4 of 12 cycles Administration: 1,000 mg (06/25/2019), 1,000 mg (07/09/2019), 1,000 mg (07/23/2019), 1,000 mg (08/06/2019) fluorouracil (ADRUCIL) 5,900 mg in sodium chloride 0.9 % 132 mL chemo infusion, 5,050 mg, Intravenous, 1 Day/Dose, 4 of 12 cycles Administration: 5,900 mg (06/25/2019), 5,900 mg (07/09/2019), 5,900 mg (07/23/2019), 5,900 mg (08/06/2019)  for chemotherapy treatment.      Interval history- Jared Gutierrez, 79 year old male, diagnosed with colon cancer, currently receiving FOLFOX chemotherapy who [facility] for complaints of muscle aches and pains which started a few days ago and have persisted since that time. He received neulasta for the first time with his last dose of chemotherapy as wbc counts had been down trending. He has not been taking claritin or neulasta. He has chronic knee pain but complains of aching type pain in his hips. Wife also concerned of swelling in his feet and legs which is chronic. Wife had reported that feet were painful but patient  denies this and says it is chronic and that muscle aches/pains are new and bothersome. No history of dvt. Not on anticoagulation. Chronic lower extremity edema with right > left. Ultrasound on 12/24/2018 was negative for dvt. History of small right baker's cyst. Per patient, not hot, painful. No fevers or chills.   ECOG FS:1 - Symptomatic but completely ambulatory  Review of systems- Review of Systems  Constitutional: Negative for chills, fever, malaise/fatigue and weight loss.  HENT: Negative for hearing loss, nosebleeds, sore throat and tinnitus.   Eyes: Negative for blurred vision and double vision.  Respiratory: Negative for cough, hemoptysis, shortness of breath and wheezing.   Cardiovascular: Negative for chest pain, palpitations and leg swelling.  Gastrointestinal: Negative for abdominal pain, blood in stool, constipation, diarrhea, melena, nausea and vomiting.  Genitourinary: Negative for dysuria and urgency.  Musculoskeletal: Positive for joint pain and myalgias. Negative for back pain and falls.       Arthalgias  Skin: Negative for itching and rash.  Neurological: Negative for dizziness, tingling, sensory change, loss of consciousness, weakness and headaches.  Endo/Heme/Allergies: Negative for environmental allergies. Does not bruise/bleed easily.  Psychiatric/Behavioral: Negative for depression. The patient is not nervous/anxious and does not have insomnia.      Current treatment- FOLFOX chemotherapy w/ neulasta support  No Known Allergies  Past Medical History:  Diagnosis Date  . Arthritis    OSTEOARTHRITIS  . Cancer ([place])   . Cavitary lesion of lung    RIGHT LOWER LOBE  . Chicken pox   .  Colon cancer ([place])   . History of kidney stones   . Hypertension   . Lipoma of colon   . Nephrolithiasis   . Nephrolithiasis   . Obesity   . Shingles   . Tubular adenoma of colon    multiple fragments    Past Surgical History:  Procedure Laterality Date  . COLON SURGERY    .  COLONOSCOPY N/A 10/02/2014   Procedure: COLONOSCOPY;  Surgeon: Josefine Class, MD;  Location: [facility] ENDOSCOPY;  Service: Endoscopy;  Laterality: N/A;  . COLONOSCOPY WITH PROPOFOL N/A 02/16/2017   Procedure: COLONOSCOPY WITH PROPOFOL;  Surgeon: Manya Silvas, MD;  Location: [facility] ENDOSCOPY;  Service: Endoscopy;  Laterality: N/A;  . EUS N/A 04/17/2019   Procedure: FULL UPPER ENDOSCOPIC ULTRASOUND (EUS) RADIAL;  Surgeon: Holly Bodily, MD;  Location: [facility] ENDOSCOPY;  Service: Gastroenterology;  Laterality: N/A;  . KIDNEY STONE SURGERY    . PARTIAL COLECTOMY  10/17/2013  . PORTACATH PLACEMENT Right 06/13/2019   Procedure: INSERTION PORT-A-CATH;  Surgeon: Benjamine Sprague, DO;  Location: [facility] ORS;  Service: General;  Laterality: Right;    Social History   Socioeconomic History  . Marital status: Married    Spouse name: Not on file  . Number of children: Not on file  . Years of education: Not on file  . Highest education level: Not on file  Occupational History  . Not on file  Tobacco Use  . Smoking status: Never Smoker  . Smokeless tobacco: Never Used  Substance and Sexual Activity  . Alcohol use: Yes    Comment: socially   . Drug use: No  . Sexual activity: Not on file  Other Topics Concern  . Not on file  Social History Narrative  . Not on file   Social Determinants of Health   Financial Resource Strain:   . Difficulty of Paying Living Expenses:   Food Insecurity:   . Worried About Charity fundraiser in the Last Year:   . Arboriculturist in the Last Year:   Transportation Needs:   . Film/video editor (Medical):   [name] Lack of Transportation (Non-Medical):   Physical Activity:   . Days of Exercise per Week:   . Minutes of Exercise per Session:   Stress:   . Feeling of Stress :   Social Connections:   . Frequency of Communication with Friends and Family:   . Frequency of Social Gatherings with Friends and Family:   . Attends Religious Services:   . Active  Member of Clubs or Organizations:   . Attends Archivist Meetings:   [name] Marital Status:   Intimate Partner Violence:   . Fear of Current or Ex-Partner:   . Emotionally Abused:   [name] Physically Abused:   . Sexually Abused:     Family History  Problem Relation Age of Onset  . Cancer Mother   . Breast cancer Mother   . COPD Father      Current Outpatient Medications:  .  amLODipine (NORVASC) 2.5 MG tablet, Take 2.5 mg by mouth daily. , Disp: , Rfl:  .  aspirin EC 81 MG tablet, Take 81 mg by mouth daily., Disp: , Rfl:  .  Cholecalciferol (VITAMIN D) 125 MCG (5000 UT) CAPS, Take 5,000 mg by mouth., Disp: , Rfl:  .  HYDROcodone-acetaminophen (NORCO) 5-325 MG tablet, Take 1 tablet by mouth every 6 (six) hours as needed for up to 3 doses for moderate pain. (Patient not taking: Reported  on 06/24/2019), Disp: 6 tablet, Rfl: 0 .  ibuprofen (ADVIL) 800 MG tablet, Take 1 tablet (800 mg total) by mouth every 8 (eight) hours as needed for mild pain or moderate pain. (Patient not taking: Reported on 06/24/2019), Disp: 30 tablet, Rfl: 0 .  lidocaine-prilocaine (EMLA) cream, Apply to affected area once (Patient not taking: Reported on 08/06/2019), Disp: 30 g, Rfl: 3 .  Nutritional Supplements (JUICE PLUS FIBRE PO), Take 3 tablets by mouth daily., Disp: , Rfl:  .  olmesartan (BENICAR) 20 MG tablet, Take 20 mg by mouth daily., Disp: , Rfl:  .  ondansetron (ZOFRAN) 8 MG tablet, Take 1 tablet (8 mg total) by mouth 2 (two) times daily as needed for refractory nausea / vomiting. Start on day 3 after chemotherapy., Disp: 30 tablet, Rfl: 1 .  potassium chloride SA (KLOR-CON) 20 MEQ tablet, Take 1 tablet (20 mEq total) by mouth daily. for 2 days only, Disp: 10 tablet, Rfl: 0 .  Probiotic Product (PROBIOTIC DAILY PO), Take 1 tablet by mouth daily., Disp: , Rfl:   Physical exam:  Vitals:   08/14/19 1635  BP: (!) 111/57  Pulse: 97  Resp: 20  Temp: 97.6 F (36.4 C)  TempSrc: Tympanic  SpO2: 99%    Physical Exam Constitutional:      General: He is not in acute distress.    Comments: Accompanied by wife. Wearing mask.   HENT:     Head: Normocephalic and atraumatic.  Eyes:     General: No scleral icterus.    Conjunctiva/sclera: Conjunctivae normal.  Pulmonary:     Effort: Pulmonary effort is normal. No respiratory distress.  Musculoskeletal:     Right lower leg: Edema (negative homan's sign. Hemosiderin staining. Not hot or tender. ) present.     Left lower leg: Edema (Edema symmetrical bilaterally 1-2+) present.  Skin:    General: Skin is warm and dry.  Neurological:     Mental Status: He is alert and oriented to person, place, and time.     Comments: ambulatory  Psychiatric:        Mood and Affect: Mood normal.        Behavior: Behavior normal.      CMP Latest Ref Rng & Units 08/06/2019  Glucose 70 - 99 mg/dL 183(H)  BUN 8 - 23 mg/dL 12  Creatinine 0.61 - 1.24 mg/dL 0.75  Sodium 135 - 145 mmol/L 137  Potassium 3.5 - 5.1 mmol/L 3.6  Chloride 98 - 111 mmol/L 104  CO2 22 - 32 mmol/L 26  Calcium 8.9 - 10.3 mg/dL 9.2  Total Protein 6.5 - 8.1 g/dL 6.2(L)  Total Bilirubin 0.3 - 1.2 mg/dL 0.8  Alkaline Phos 38 - 126 U/L 53  AST 15 - 41 U/L 39  ALT 0 - 44 U/L 34   CBC Latest Ref Rng & Units 08/13/2019  WBC 4.0 - 10.5 K/uL 15.6(H)  Hemoglobin 13.0 - 17.0 g/dL 13.1  Hematocrit 39.0 - 52.0 % 40.2  Platelets 150 - 400 K/uL 165    No images are attached to the encounter.  No results found.  Assessment and plan- Patient is a 79 y.o. male diagnosed with metastatic colon cancer currently receiving FOLFOX chemotherapy, who presents to symptom management clinic for acute blood swelling.   1.  Myalgias and Arthalgias- likely secondary to GCSF. Advised to take tylenol and claritin for pain. Increase oral hydration. If symptoms refractory, could take Norco as prescribed. Mechanism of action of GCSF discussed. Rationale for use explained.  2. Lower extremity edema- history of  chronic bilateral lower extremity edema. Exam today negative for cellulitis or dvt. If symptoms persist, in the setting of malignancy, consider ultrasound to r/o dvt. Discussed symptoms of cellulitis including fever that would warrant antibacterial coverage particularly in setting of neutropenia & chemotherapy. Can also consider ref to vascular for further work-up, evaluation, or management.   Disposition:  Follow up with Dr. Mike Gip as scheduled. RTC is symptoms do not improve or worsen in the interim.   Visit Diagnosis 1. Bone pain due to G-CSF   2. Swelling of lower extremity     Patient expressed understanding and was in agreement with this plan. He also understands that He can call clinic at any time with any questions, concerns, or complaints.   Thank you for allowing me to participate in the care of this very pleasant patient.   Beckey Rutter, DNP, AGNP-[facility] [place] [facility] [place]

## 2019-08-14 NOTE — Telephone Encounter (Signed)
  Patient should be seen.  Can he go to symptom management today?  Would want to make sure he does not have a DVT.  Any signs of infection?  M

## 2019-08-14 NOTE — Telephone Encounter (Signed)
Spoke to patient's wife via telephone. Patient has swelling in both legs, but left leg is visible larger than the right. Patient in the [place] left leg and foot, with darkening of the big toe. Patient's wife stated that she will bring him in to see Beckey Rutter, NP in the symptom management clinic now.

## 2019-08-16 ENCOUNTER — Inpatient Hospital Stay: Payer: Medicare Other

## 2019-08-16 ENCOUNTER — Emergency Department: Payer: Medicare Other

## 2019-08-16 ENCOUNTER — Observation Stay
Admission: EM | Admit: 2019-08-16 | Discharge: 2019-08-17 | Disposition: A | Discharge status: Home / Self Care | Payer: Medicare Other | Attending: Family Medicine | Admitting: Family Medicine

## 2019-08-16 ENCOUNTER — Encounter: Payer: Self-pay | Admitting: Internal Medicine

## 2019-08-16 ENCOUNTER — Encounter: Payer: Self-pay | Admitting: Nurse Practitioner

## 2019-08-16 ENCOUNTER — Other Ambulatory Visit: Payer: Self-pay

## 2019-08-16 DIAGNOSIS — Z5111 Encounter for antineoplastic chemotherapy: Secondary | ICD-10-CM

## 2019-08-16 DIAGNOSIS — Z20822 Contact with and (suspected) exposure to covid-19: Secondary | ICD-10-CM | POA: Diagnosis not present

## 2019-08-16 DIAGNOSIS — C786 Secondary malignant neoplasm of retroperitoneum and peritoneum: Secondary | ICD-10-CM | POA: Diagnosis not present

## 2019-08-16 DIAGNOSIS — Z79899 Other long term (current) drug therapy: Secondary | ICD-10-CM | POA: Diagnosis not present

## 2019-08-16 DIAGNOSIS — Z9049 Acquired absence of other specified parts of digestive tract: Secondary | ICD-10-CM | POA: Diagnosis not present

## 2019-08-16 DIAGNOSIS — G608 Other hereditary and idiopathic neuropathies: Secondary | ICD-10-CM | POA: Insufficient documentation

## 2019-08-16 DIAGNOSIS — M1990 Unspecified osteoarthritis, unspecified site: Secondary | ICD-10-CM | POA: Diagnosis not present

## 2019-08-16 DIAGNOSIS — A419 Sepsis, unspecified organism: Secondary | ICD-10-CM | POA: Diagnosis not present

## 2019-08-16 DIAGNOSIS — D849 Immunodeficiency, unspecified: Secondary | ICD-10-CM | POA: Insufficient documentation

## 2019-08-16 DIAGNOSIS — L03115 Cellulitis of right lower limb: Secondary | ICD-10-CM | POA: Insufficient documentation

## 2019-08-16 DIAGNOSIS — M109 Gout, unspecified: Secondary | ICD-10-CM | POA: Diagnosis not present

## 2019-08-16 DIAGNOSIS — Z6841 Body Mass Index (BMI) 40.0 and over, adult: Secondary | ICD-10-CM | POA: Insufficient documentation

## 2019-08-16 DIAGNOSIS — I10 Essential (primary) hypertension: Secondary | ICD-10-CM | POA: Diagnosis not present

## 2019-08-16 DIAGNOSIS — Z7982 Long term (current) use of aspirin: Secondary | ICD-10-CM | POA: Insufficient documentation

## 2019-08-16 DIAGNOSIS — C189 Malignant neoplasm of colon, unspecified: Secondary | ICD-10-CM | POA: Insufficient documentation

## 2019-08-16 DIAGNOSIS — M7989 Other specified soft tissue disorders: Secondary | ICD-10-CM

## 2019-08-16 DIAGNOSIS — R7989 Other specified abnormal findings of blood chemistry: Secondary | ICD-10-CM

## 2019-08-16 LAB — COMPREHENSIVE METABOLIC PANEL
ALT: 34 U/L (ref 0–44)
AST: 37 U/L (ref 15–41)
Albumin: 3.7 g/dL (ref 3.5–5.0)
Alkaline Phosphatase: 85 U/L (ref 38–126)
Anion gap: 8 (ref 5–15)
BUN: 8 mg/dL (ref 8–23)
CO2: 27 mmol/L (ref 22–32)
Calcium: 9.2 mg/dL (ref 8.9–10.3)
Chloride: 102 mmol/L (ref 98–111)
Creatinine, Ser: 0.94 mg/dL (ref 0.61–1.24)
GFR calc Af Amer: >60 mL/min (ref >60)
GFR calc non Af Amer: >60 mL/min (ref >60)
Glucose, Bld: 111 mg/dL — ABNORMAL HIGH (ref 70–99)
Potassium: 3.8 mmol/L (ref 3.5–5.1)
Sodium: 137 mmol/L (ref 135–145)
Total Bilirubin: 1 mg/dL (ref 0.3–1.2)
Total Protein: 7.2 g/dL (ref 6.5–8.1)

## 2019-08-16 LAB — CBC WITH DIFFERENTIAL/PLATELET
Abs Immature Granulocytes: 0.59 10*3/uL — ABNORMAL HIGH (ref 0.00–0.07)
Basophils Absolute: 0.1 10*3/uL (ref 0.0–0.1)
Basophils Relative: 2 %
Eosinophils Absolute: 0 10*3/uL (ref 0.0–0.5)
Eosinophils Relative: 0 %
HCT: 38.3 % — ABNORMAL LOW (ref 39.0–52.0)
Hemoglobin: 13 g/dL (ref 13.0–17.0)
Immature Granulocytes: 7 %
Lymphocytes Relative: 33 %
Lymphs Abs: 2.9 10*3/uL (ref 0.7–4.0)
MCH: 32.1 pg (ref 26.0–34.0)
MCHC: 33.9 g/dL (ref 30.0–36.0)
MCV: 94.6 fL (ref 80.0–100.0)
Monocytes Absolute: 1.6 10*3/uL — ABNORMAL HIGH (ref 0.1–1.0)
Monocytes Relative: 18 %
Neutro Abs: 3.6 10*3/uL (ref 1.7–7.7)
Neutrophils Relative %: 40 %
Platelets: 150 10*3/uL (ref 150–400)
RBC: 4.05 MIL/uL — ABNORMAL LOW (ref 4.22–5.81)
RDW: 14.2 % (ref 11.5–15.5)
WBC: 8.8 10*3/uL (ref 4.0–10.5)
nRBC: 0.8 % — ABNORMAL HIGH (ref 0.0–0.2)

## 2019-08-16 LAB — LACTIC ACID, PLASMA
Lactic Acid, Venous: 2.6 mmol/L (ref 0.5–1.9)
Lactic Acid, Venous: 1.3 mmol/L (ref 0.5–1.9)

## 2019-08-16 LAB — FIBRIN DERIVATIVES D-DIMER (ARMC ONLY): Fibrin derivatives D-dimer (ARMC): 5308.58 ng/mL (FEU) — ABNORMAL HIGH (ref 0.00–499.00)

## 2019-08-16 LAB — PROCALCITONIN: Procalcitonin: 0.26 ng/mL

## 2019-08-16 LAB — URIC ACID: Uric Acid, Serum: 7.4 mg/dL (ref 3.7–8.6)

## 2019-08-16 LAB — SARS CORONAVIRUS 2 (TAT 6-24 HRS): SARS Coronavirus 2: NEGATIVE

## 2019-08-16 MED ORDER — SODIUM CHLORIDE 0.9 % IV BOLUS
1000.0000 mL | Freq: Once | INTRAVENOUS | Status: AC
  Administered 2019-08-16: 1000 mL via INTRAVENOUS

## 2019-08-16 MED ORDER — VANCOMYCIN HCL IN DEXTROSE 1-5 GM/200ML-% IV SOLN
1000.0000 mg | Freq: Once | INTRAVENOUS | Status: AC
  Administered 2019-08-16: 12:00:00 1000 mg via INTRAVENOUS
  Filled 2019-08-16: qty 200

## 2019-08-16 MED ORDER — COLCHICINE 0.6 MG PO TABS
0.6000 mg | ORAL_TABLET | Freq: Once | ORAL | Status: AC
  Administered 2019-08-16: 0.6 mg via ORAL
  Filled 2019-08-16 (×2): qty 1

## 2019-08-16 MED ORDER — MORPHINE SULFATE (PF) 2 MG/ML IV SOLN: 2.0000 mg | INTRAVENOUS | Status: DC | PRN

## 2019-08-16 MED ORDER — HYDROCODONE-ACETAMINOPHEN 5-325 MG PO TABS: 1.0000 | ORAL_TABLET | ORAL | Status: DC | PRN

## 2019-08-16 MED ORDER — VANCOMYCIN HCL IN DEXTROSE 1-5 GM/200ML-% IV SOLN
1000.0000 mg | Freq: Once | INTRAVENOUS | Status: AC
  Administered 2019-08-16: 1000 mg via INTRAVENOUS
  Filled 2019-08-16: qty 200

## 2019-08-16 MED ORDER — SENNOSIDES-DOCUSATE SODIUM 8.6-50 MG PO TABS: 1.0000 | ORAL_TABLET | Freq: Every evening | ORAL | Status: DC | PRN

## 2019-08-16 MED ORDER — KETOROLAC TROMETHAMINE 30 MG/ML IJ SOLN
30.0000 mg | Freq: Three times a day (TID) | INTRAMUSCULAR | Status: DC
  Administered 2019-08-16: 30 mg via INTRAVENOUS
  Filled 2019-08-16: qty 1

## 2019-08-16 MED ORDER — ENOXAPARIN SODIUM 40 MG/0.4ML ~~LOC~~ SOLN
40.0000 mg | Freq: Two times a day (BID) | SUBCUTANEOUS | Status: DC
  Administered 2019-08-16 – 2019-08-17 (×3): 40 mg via SUBCUTANEOUS
  Filled 2019-08-16 (×3): qty 0.4

## 2019-08-16 MED ORDER — VANCOMYCIN HCL 1750 MG/350ML IV SOLN: 1750.0000 mg | INTRAVENOUS | Status: DC

## 2019-08-16 MED ORDER — ONDANSETRON HCL 4 MG/2ML IJ SOLN: 4.0000 mg | Freq: Four times a day (QID) | INTRAMUSCULAR | Status: DC | PRN

## 2019-08-16 MED ORDER — SODIUM CHLORIDE 0.9 % IV SOLN
1.0000 g | INTRAVENOUS | Status: DC
  Administered 2019-08-16: 17:00:00 1 g via INTRAVENOUS
  Filled 2019-08-16: qty 1
  Filled 2019-08-16: qty 10

## 2019-08-16 MED ORDER — ACETAMINOPHEN 650 MG RE SUPP: 650.0000 mg | Freq: Four times a day (QID) | RECTAL | Status: DC | PRN

## 2019-08-16 MED ORDER — KETOROLAC TROMETHAMINE 30 MG/ML IJ SOLN
15.0000 mg | Freq: Four times a day (QID) | INTRAMUSCULAR | Status: DC
  Administered 2019-08-16 – 2019-08-17 (×3): 15 mg via INTRAVENOUS
  Filled 2019-08-16 (×3): qty 1

## 2019-08-16 MED ORDER — SODIUM CHLORIDE 0.9 % IV SOLN
2.0000 g | Freq: Once | INTRAVENOUS | Status: AC
  Administered 2019-08-16: 05:00:00 2 g via INTRAVENOUS
  Filled 2019-08-16: qty 2

## 2019-08-16 MED ORDER — METRONIDAZOLE IN NACL 5-0.79 MG/ML-% IV SOLN
500.0000 mg | Freq: Once | INTRAVENOUS | Status: AC
  Administered 2019-08-16: 06:00:00 500 mg via INTRAVENOUS
  Filled 2019-08-16: qty 100

## 2019-08-16 MED ORDER — SODIUM CHLORIDE 0.9 % IV SOLN: INTRAVENOUS | Status: DC

## 2019-08-16 MED ORDER — SODIUM CHLORIDE 0.9 % IV BOLUS (SEPSIS)
1000.0000 mL | Freq: Once | INTRAVENOUS | Status: AC
  Administered 2019-08-16: 1000 mL via INTRAVENOUS

## 2019-08-16 MED ORDER — SODIUM CHLORIDE 0.9 % IV SOLN
2.0000 g | Freq: Three times a day (TID) | INTRAVENOUS | Status: DC
  Filled 2019-08-16 (×2): qty 2

## 2019-08-16 MED ORDER — ONDANSETRON HCL 4 MG PO TABS: 4.0000 mg | ORAL_TABLET | Freq: Four times a day (QID) | ORAL | Status: DC | PRN

## 2019-08-16 MED ORDER — METRONIDAZOLE IN NACL 5-0.79 MG/ML-% IV SOLN
500.0000 mg | Freq: Three times a day (TID) | INTRAVENOUS | Status: DC
  Filled 2019-08-16 (×2): qty 100

## 2019-08-16 MED ORDER — ACETAMINOPHEN 325 MG PO TABS: 650.0000 mg | ORAL_TABLET | Freq: Four times a day (QID) | ORAL | Status: DC | PRN

## 2019-08-16 NOTE — ED Notes (Signed)
Report given to Malka, RN 

## 2019-08-16 NOTE — ED Notes (Signed)
Admitting md at bedside

## 2019-08-16 NOTE — ED Notes (Signed)
Critical lactic of 2.6 called from lab. Dr. Joni Fears notified, no new orders received. Charge rn heather aware as well, treatment room pending.

## 2019-08-16 NOTE — Progress Notes (Signed)
PROGRESS NOTE  Brief Narrative: Jared Gutierrez is a 79 y.o. male with a history of metastatic colon CA undergoing chemotherapy, HTN, and gout who presented to the ED 4/3 with oral temperature of 100.66F at home in the setting of having awoken with pain in the right foot greater than left foot on 4/1 accompanied by progressive redness and swelling. He came to the ED because he's a chemo patient and told to present for temperature over 100.77F, doesn't feel particularly ill. Afebrile with stable vital signs on arrival, WBC 8.8k. Lactic acid was 2.6 but other labs unremarkable. IV fluids and broad antibiotics started, hospitalist called for admission for sepsis due to cellulitis.    Subjective: Jared Gutierrez and his wife at the bedside confirm the history as above. The pain in his feet was so bad he couldn't walk on Thursday morning, trying to bear weight with a walker now. He confirms he has a history of gout and he and his wife thought this episode could be due to that. No personal or FH blood clots. He sleeps on an incline, but has done so for many months without change. Denies any wounds or bug bites.  Objective: BP 120/80 (BP Location: Left Arm)   Pulse 92   Temp 98 F (36.7 C)   Resp 20   Ht 5\' 8"  (1.727 m)   Wt 124.7 kg   SpO2 98%   BMI 41.81 kg/m   Gen: Pleasant, non ill-appearing male in no distress Pulm: Clear and nonlabored  CV: RRR, no murmur, no JVD. GI: Soft, NT, ND, +BS  Neuro: Alert and oriented. No focal deficits. Skin: Bright red erythema which is very tender over the right MTP extending along the dorsum with associated edema. No fluctuance or discharge. Left great toes less erythematous, tender, slightly swollen diffusely. He has no edema above the ankles.   Assessment & Plan: Cellulitis vs. acute gout flare in MTP R > L, possibly complicated by DVT.  - Pt reports history of gout years ago and clinical presentation seems most consistent with this, possibly provoked by  chemotherapy-induced cell turnover. Will give colchicine, start toradol (CrCl wnl). Can check uric acid and possibly start ULT. - Bilateral cellulitis without inciting event is less likely, though with patient on chemotherapy and temperature of 100.66F, we will continue abx, narrow to ceftriaxone monoTx as there's no purulence. If having significant improvement in 24 hours and cultures negative, would consider DC'ing abx.   - Acute DVT also possible in CA patient, checked d-dimer which did not rule out (has CA on chemotherapy) so will check venous U/S.   Patrecia Pour, MD Pager on amion 08/16/2019, 1:48 PM

## 2019-08-16 NOTE — Progress Notes (Signed)
Pharmacy Antibiotic Note  Jared Gutierrez is a 79 y.o. male admitted on 08/16/2019 with sepsis/cellulitis. Pharmacy has been consulted for Vancomycin and Cefepime dosing.  Plan: Cefepime 2 gm IV q8h  Vancomycin 1750 mg IV Q 24 hrs. Goal AUC 400-550. Expected AUC: 497.6, Css 10.9 SCr used: 0.94   Height: 5\' 8"  (172.7 cm) Weight: 124.7 kg (275 lb) IBW/kg (Calculated) : 68.4  Temp (24hrs), Avg:98.3 F (36.8 C), Min:98.3 F (36.8 C), Max:98.3 F (36.8 C)  Recent Labs  Lab 08/13/19 0908 08/16/19 0230 08/16/19 0602  WBC 15.6* 8.8  --   CREATININE  --  0.94  --   LATICACIDVEN  --  2.6* 1.3    Estimated Creatinine Clearance: 83.3 mL/min (by C-G formula based on SCr of 0.94 mg/dL).    No Known Allergies  Antimicrobials this admission: Metro 4/3 >>  Cefepime 4/3>>   Vanc 4/3 >>   Dose adjustments this admission:   Microbiology results:  BCx:   UCx:    Sputum:    MRSA PCR:   Thank you for allowing pharmacy to be a part of this patient's care.  Samai Corea A 08/16/2019 8:23 AM

## 2019-08-16 NOTE — ED Notes (Signed)
Pt provided with urinal

## 2019-08-16 NOTE — ED Provider Notes (Signed)
[facility] Emergency Department Provider Note  ____________________________________________  Time seen: Approximately 4:57 AM  I have reviewed the triage vital signs and the nursing notes.   HISTORY  Chief Complaint Code Sepsis    HPI Jared Gutierrez is a 79 y.o. male with a history of hypertension, colon cancer, receiving chemotherapy who comes ED complaining of bilateral foot swelling and redness, worse on the right.  Last chemo treatment was 1 week ago.  Also had a fever at home of 100.5.  Denies body aches chest pain shortness of breath vomiting diarrhea headache neck stiffness dysuria or frequency.  They called his oncology clinic who sent him to the ED for evaluation.  Foot pain swelling and redness started gradually 1 day ago, constant, no aggravating or alleviating factors.  Pain is nonradiating, moderate intensity.  Denies history of gout      Past Medical History:  Diagnosis Date  . Arthritis    OSTEOARTHRITIS  . Cancer ([place])   . Cavitary lesion of lung    RIGHT LOWER LOBE  . Chicken pox   . Colon cancer ([name])   . History of kidney stones   . Hypertension   . Lipoma of colon   . Nephrolithiasis   . Nephrolithiasis   . Obesity   . Shingles   . Tubular adenoma of colon    multiple fragments     Patient Active Problem List   Diagnosis Date Noted  . 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 Hypokalemia 07/23/2019  . Lesion of right lung 07/01/2019  . Nephrolithiasis 07/01/2019  . Osteoarthritis 07/01/2019  . Cancer of transverse colon ([place]) 06/25/2019  . Encounter for antineoplastic chemotherapy 06/25/2019  . Goals of care, counseling/discussion 06/25/2019  . 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 Morbid obesity with BMI of 40.0-44.9, adult ([place]) 01/27/2019  . Elevated CEA 12/24/2018  . Hypertension 05/10/2016  . Colon carcinoma ([place]) 02/03/2015  . Primary osteoarthritis of left knee 02/05/2014  . History of colon  cancer, stage I 10/17/2013     Past Surgical History:  Procedure Laterality Date  . COLON SURGERY    . COLONOSCOPY N/A 10/02/2014   Procedure: COLONOSCOPY;  Surgeon: Josefine Class, MD;  Location: [facility] ENDOSCOPY;  Service: Endoscopy;  Laterality: N/A;  . COLONOSCOPY WITH PROPOFOL N/A 02/16/2017   Procedure: COLONOSCOPY WITH PROPOFOL;  Surgeon: Manya Silvas, MD;  Location: [facility] ENDOSCOPY;  Service: Endoscopy;  Laterality: N/A;  . EUS N/A 04/17/2019   Procedure: FULL UPPER ENDOSCOPIC ULTRASOUND (EUS) RADIAL;  Surgeon: Holly Bodily, MD;  Location: [facility] ENDOSCOPY;  Service: Gastroenterology;  Laterality: N/A;  . KIDNEY STONE SURGERY    . PARTIAL COLECTOMY  10/17/2013  . PORTACATH PLACEMENT Right 06/13/2019   Procedure: INSERTION PORT-A-CATH;  Surgeon: Benjamine Sprague, DO;  Location: [facility] ORS;  Service: General;  Laterality: Right;     Prior to Admission medications   Medication Sig Start Date End Date Taking? Authorizing Provider  amLODipine (NORVASC) 2.5 MG tablet Take 2.5 mg by mouth daily.  01/27/19 01/27/20  [provider]  aspirin EC 81 MG tablet Take 81 mg by mouth daily.    [provider]  Cholecalciferol (VITAMIN D) 125 MCG (5000 UT) CAPS Take 5,000 mg by mouth.    [provider]  ibuprofen (ADVIL) 800 MG tablet Take 1 tablet (800 mg total) by mouth every 8 (eight) hours as needed for mild pain or moderate pain. Patient not taking: Reported on 06/24/2019 06/13/19  [name], Isami, DO  lidocaine-prilocaine (EMLA) cream Apply to affected area once 06/09/19   Lequita Asal, MD  Nutritional Supplements (JUICE PLUS FIBRE PO) Take 3 tablets by mouth daily.    [provider]  olmesartan (BENICAR) 20 MG tablet Take 20 mg by mouth daily. 09/26/17 06/08/20  [provider]  ondansetron (ZOFRAN) 8 MG tablet Take 1 tablet (8 mg total) by mouth 2 (two) times daily as needed for refractory nausea / vomiting. Start on day 3 after  chemotherapy. 06/09/19   Lequita Asal, MD  potassium chloride SA (KLOR-CON) 20 MEQ tablet Take 1 tablet (20 mEq total) by mouth daily. for 2 days only 07/09/19   Lequita Asal, MD  Probiotic Product (PROBIOTIC DAILY PO) Take 1 tablet by mouth daily.    [provider]     Allergies Patient has no known allergies.   Family History  Problem Relation Age of Onset  . Cancer Mother   . Breast cancer Mother   . COPD Father     Social History Social History   Tobacco Use  . Smoking status: Never Smoker  . Smokeless tobacco: Never Used  Substance Use Topics  . Alcohol use: Yes    Comment: socially   . Drug use: No    Review of Systems  Constitutional:   Positive fever without chills.  ENT:   No sore throat. No rhinorrhea. Cardiovascular:   No chest pain or syncope. Respiratory:   No dyspnea or cough. Gastrointestinal:   Negative for abdominal pain, vomiting and diarrhea.  Musculoskeletal:   Positive foot pain swelling and redness as above All other systems reviewed and are negative except as documented above in ROS and HPI.  ____________________________________________   PHYSICAL EXAM:  VITAL SIGNS: ED Triage Vitals  Enc Vitals Group     BP 08/16/19 0222 124/78     Pulse Rate 08/16/19 0222 (!) 113     Resp 08/16/19 0222 18     Temp 08/16/19 0222 98.3 F (36.8 C)     Temp Source 08/16/19 0222 Oral     SpO2 08/16/19 0222 98 %     Weight 08/16/19 0223 275 lb (124.7 kg)     Height 08/16/19 0223 5\' 8"  (1.727 m)     Head Circumference --      Peak Flow --      Pain Score 08/16/19 0222 6     Pain Loc --      Pain Edu? --      Excl. in [place]? --     Vital signs reviewed, nursing assessments reviewed.   Constitutional:   Alert and oriented. Non-toxic appearance. Eyes:   Conjunctivae are normal. EOMI. PERRL. ENT      Head:   Normocephalic and atraumatic.      Nose:   Normal.      Mouth/Throat:   Moist mucous membranes      Neck:   No  meningismus. Full ROM. Hematological/Lymphatic/Immunilogical:   No cervical lymphadenopathy. Cardiovascular:   RRR. Symmetric bilateral radial and DP pulses.  No murmurs. Cap refill less than 2 seconds. Respiratory:   Normal respiratory effort without tachypnea/retractions. Breath sounds are clear and equal bilaterally. No wheezes/rales/rhonchi. Gastrointestinal:   Soft and nontender. Non distended. There is no CVA tenderness.  No rebound, rigidity, or guarding.  Musculoskeletal:   Normal range of motion in all extremities. No joint effusions.  No lower extremity tenderness.  Swelling of bilateral feet, right greater than left.  There is diffuse erythema of the right dorsal foot with warmth and tenderness.  Worse around the MTP joint, but there is no pain with passive range of motion of the first MTP joint.  Normal distal perfusion.  No crepitus.  No open wounds. Neurologic:   Normal speech and language.  Motor grossly intact. No acute focal neurologic deficits are appreciated.  Skin:    Skin is warm, dry and intact.  Erythema right dorsal foot as above, no lymphangitic streaking.  No petechiae, purpura, or bullae.  ____________________________________________    LABS (pertinent positives/negatives) (all labs ordered are listed, but only abnormal results are displayed) Labs Reviewed  LACTIC ACID, PLASMA - Abnormal; Notable for the following components:      Result Value   Lactic Acid, Venous 2.6 (*)    All other components within normal limits  COMPREHENSIVE METABOLIC PANEL - Abnormal; Notable for the following components:   Glucose, Bld 111 (*)    All other components within normal limits  CBC WITH DIFFERENTIAL/PLATELET - Abnormal; Notable for the following components:   RBC 4.05 (*)    HCT 38.3 (*)    nRBC 0.8 (*)    Monocytes Absolute 1.6 (*)    Abs Immature Granulocytes 0.59 (*)    All other components within normal limits  CULTURE, BLOOD (ROUTINE X 2)  CULTURE, BLOOD (ROUTINE X  2)  SARS CORONAVIRUS 2 (TAT 6-24 HRS)  LACTIC ACID, PLASMA  URINALYSIS, COMPLETE (UACMP) WITH MICROSCOPIC  LACTIC ACID, PLASMA   ____________________________________________   EKG    ____________________________________________    RADIOLOGY  No results found.  ____________________________________________   PROCEDURES .Critical Care Performed by: Carrie Mew, MD Authorized by: Carrie Mew, MD   Critical care provider statement:    Critical care time (minutes):  33   Critical care time was exclusive of:  Separately billable procedures and treating other patients   Critical care was necessary to treat or prevent imminent or life-threatening deterioration of the following conditions:  Sepsis   Critical care was time spent personally by me on the following activities:  Development of treatment plan with patient or surrogate, discussions with consultants, evaluation of patient's response to treatment, examination of patient, obtaining history from patient or surrogate, ordering and performing treatments and interventions, ordering and review of laboratory studies, ordering and review of radiographic studies, pulse oximetry, re-evaluation of patient's condition and review of old charts    ____________________________________________    CLINICAL IMPRESSION / [place] / ED COURSE  Medications ordered in the ED: Medications  ceFEPIme (MAXIPIME) 2 g in sodium chloride 0.9 % 100 mL IVPB (has no administration in time range)  metroNIDAZOLE (FLAGYL) IVPB 500 mg (has no administration in time range)  vancomycin (VANCOCIN) IVPB 1000 mg/200 mL premix (has no administration in time range)  sodium chloride 0.9 % bolus 1,000 mL (1,000 mLs Intravenous New Bag/Given 08/16/19 0359)    Pertinent labs & imaging results that were available during my care of the patient were reviewed by me and considered in my medical decision making (see chart for details).  ERWIN RASHEED was evaluated in Emergency Department on 08/16/2019 for the symptoms described in the history of present illness. He was evaluated in the context of the global COVID-19 pandemic, which necessitated consideration that the patient might be at risk for infection with the SARS-CoV-2 virus that causes COVID-19. Institutional protocols and algorithms that pertain to the evaluation of patients at risk for COVID-19 are in a state of  rapid change based on information released by regulatory bodies including the CDC and federal and state organizations. These policies and algorithms were followed during the patient's care in the ED.     Clinical Course as of Aug 16 455  Sat Aug 16, 2019  0455 Patient presents with pain redness and swelling of the right foot consistent with cellulitis.  He had a temperature of 104 at home, last took Tylenol at about 11:30 PM this evening which I think is currently masking the fever.  He presented with tachycardia, consistent with sepsis.  Lactate elevated at 2.6.  No signs of shock.  Due to age and possible immunosuppression from current chemotherapy, will need to admit for IV antibiotics and clinical monitoring.   [PS]    Clinical Course User Index [PS] Carrie Mew, MD     ____________________________________________   FINAL CLINICAL IMPRESSION(S) / ED DIAGNOSES    Final diagnoses:  Cellulitis of right foot  Severe sepsis [facility])     ED Discharge Orders    None      Portions of this note were generated with dragon dictation software. Dictation errors may occur despite best attempts at proofreading.   Carrie Mew, MD 08/16/19 [phone]

## 2019-08-16 NOTE — ED Notes (Signed)
Pt provided with warm blankets and a phone; wife at bedside

## 2019-08-16 NOTE — Progress Notes (Signed)
CODE SEPSIS - PHARMACY COMMUNICATION  **Broad Spectrum Antibiotics should be administered within 1 hour of Sepsis diagnosis**  Time Code Sepsis Called/Page Received: 0500  Antibiotics Ordered: Cefepime and Vancomycin  Time of 1st antibiotic administration: 0512  Additional action taken by pharmacy: n/a  If necessary, Name of Provider/Nurse Contacted: n/a    Ena Dawley ,PharmD Clinical Pharmacist  08/16/2019  5:00 AM

## 2019-08-16 NOTE — Progress Notes (Signed)
PHARMACY -  BRIEF ANTIBIOTIC NOTE   Pharmacy has received consult(s) for Cefepime and Vancomycin from an ED provider.  The patient's profile has been reviewed for ht/wt/allergies/indication/available labs.    One time order(s) placed for Cefepime 2gm and Vancomycin 2gm (1gm + 1gm)  Further antibiotics/pharmacy consults should be ordered by admitting physician if indicated.                       Thank you, Hart Robinsons A 08/16/2019  5:00 AM

## 2019-08-16 NOTE — ED Notes (Signed)
One set of blood cultures obtained in triage if needed.

## 2019-08-16 NOTE — H&P (Signed)
History and Physical    Jared Gutierrez [id number] DOB: 1940-06-18 DOA: 08/16/2019  PCP: Tracie Harrier, MD   Patient coming from: home I have personally briefly reviewed patient's old medical records in [place]  Chief Complaint: leg swelling and redness  HPI: Jared Gutierrez is a 79 y.o. male with medical history significant for hypertension and metastatic colon cancer on chemotherapy last session 1 week prior who presents to the emergency room with a several day history of bilateral lower extremity swelling, worse on the right with redness on the left foot.  Developed a fever 0.5 earlier in the day and decided to come in to be checked out.  He denies cough or shortness of breath.  Denies nausea vomiting or diarrhea and denies headache neck stiffness or dysuria.  ED Course: Upon arrival in the emergency room temperature was 98 7, pulse 97, BP 111/57 with O2 sat 99% on room air.  WBC was 8.8, lactic acid 2.6 other labs within acceptable range.  Patient was started on cefepime, metronidazole and vancomycin for sepsis secondary to cellulitis.  Hospitalist consulted for admission.  Review of Systems: As per HPI otherwise 10 point review of systems negative.    Past Medical History:  Diagnosis Date  . Arthritis    OSTEOARTHRITIS  . Cancer ([place])   . Cavitary lesion of lung    RIGHT LOWER LOBE  . Chicken pox   . Colon cancer ([place])   . History of kidney stones   . Hypertension   . Lipoma of colon   . Nephrolithiasis   . Nephrolithiasis   . Obesity   . Shingles   . Tubular adenoma of colon    multiple fragments    Past Surgical History:  Procedure Laterality Date  . COLON SURGERY    . COLONOSCOPY N/A 10/02/2014   Procedure: COLONOSCOPY;  Surgeon: Josefine Class, MD;  Location: [facility] ENDOSCOPY;  Service: Endoscopy;  Laterality: N/A;  . COLONOSCOPY WITH PROPOFOL N/A 02/16/2017   Procedure: COLONOSCOPY WITH PROPOFOL;  Surgeon: Manya Silvas, MD;  Location: [facility]  ENDOSCOPY;  Service: Endoscopy;  Laterality: N/A;  . EUS N/A 04/17/2019   Procedure: FULL UPPER ENDOSCOPIC ULTRASOUND (EUS) RADIAL;  Surgeon: Holly Bodily, MD;  Location[facility] ENDOSCOPY;  Service: Gastroenterology;  Laterality: N/A;  . KIDNEY STONE SURGERY    . PARTIAL COLECTOMY  10/17/2013  . PORTACATH PLACEMENT Right 06/13/2019   Procedure: INSERTION PORT-A-CATH;  Surgeon: Benjamine Sprague, DO;  Location: [facility] ORS;  Service: General;  Laterality: Right;     reports that he has never smoked. He has never used smokeless tobacco. He reports current alcohol use. He reports that he does not use drugs.  No Known Allergies  Family History  Problem Relation Age of Onset  . Cancer Mother   . Breast cancer Mother   . COPD Father      Prior to Admission medications   Medication Sig Start Date End Date Taking? Authorizing Provider  amLODipine (NORVASC) 2.5 MG tablet Take 2.5 mg by mouth daily.  01/27/19 01/27/20 Yes [provider]  aspirin EC 81 MG tablet Take 81 mg by mouth daily.   Yes [provider]  Cholecalciferol (VITAMIN D) 125 MCG (5000 UT) CAPS Take 5,000 mg by mouth.   Yes [provider]  HYDROcodone-acetaminophen (NORCO/VICODIN) 5-325 MG tablet Take 1 tablet by mouth every 6 (six) hours as needed for moderate pain (up to 3 doses for moderate pain.).    Yes [provider]  ibuprofen (ADVIL) 800 MG tablet Take 1 tablet (800 mg total) by mouth every 8 (eight) hours as needed for mild pain or moderate pain. 06/13/19  Yes Sakai, Isami, DO  lidocaine-prilocaine (EMLA) cream Apply to affected area once 06/09/19  Yes Corcoran, Melissa C, MD  loratadine (CLARITIN) 10 MG tablet Take 10 mg by mouth daily.   Yes [provider]  olmesartan (BENICAR) 20 MG tablet Take 20 mg by mouth daily. 09/26/17 06/08/20 Yes [provider]  ondansetron (ZOFRAN) 8 MG tablet Take 1 tablet (8 mg total) by mouth 2 (two) times daily as needed for refractory  nausea / vomiting. Start on day 3 after chemotherapy. 06/09/19  Yes Lequita Asal, MD  Probiotic Product (PROBIOTIC DAILY PO) Take 1 tablet by mouth daily.   Yes [provider]    Physical Exam: Vitals:   08/16/19 0401 08/16/19 0409 08/16/19 0430 08/16/19 0500  BP: 126/70 126/70 (!) 152/69 (!) 148/78  Pulse: 76 76 67 66  Resp:  18 20 18   Temp:      TempSrc:      SpO2: 97% 99% 99% 98%  Weight:      Height:         Vitals:   08/16/19 0401 08/16/19 0409 08/16/19 0430 08/16/19 0500  BP: 126/70 126/70 (!) 152/69 (!) 148/78  Pulse: 76 76 67 66  Resp:  18 20 18   Temp:      TempSrc:      SpO2: 97% 99% 99% 98%  Weight:      Height:        Constitutional: Alert and awake, oriented x3, not in any acute distress. Eyes: PERLA, EOMI, irises appear normal, anicteric sclera,  ENMT: external ears and nose appear normal, normal hearing             Lips appears normal, oropharynx mucosa, tongue, posterior pharynx appear normal  Neck: neck appears normal, no masses, normal ROM, no thyromegaly, no JVD  CVS: S1-S2 clear, no murmur rubs or gallops,  , no carotid bruits, pedal pulses palpable, 3+ pitting right lower extremity edema, 1+ on the left  respiratory:  clear to auscultation bilaterally, no wheezing, rales or rhonchi. Respiratory effort normal. No accessory muscle use.  Abdomen: soft nontender, nondistended, normal bowel sounds, no hepatosplenomegaly, no hernias Musculoskeletal: : no cyanosis, clubbing , no contractures or atrophy Redness and warmth medial left foot over metatarsal joint, redness warmth and tenderness medial right foot extending onto the dorsum right foot and just at the ankle. Neuro: Cranial nerves II-XII intact, sensation, reflexes normal, strength Psych: judgement and insight appear normal, stable mood and affect,  Skin: Redness and warmth medial left foot over metatarsal joint, redness warmth and tenderness medial right foot extending onto the dorsum  right foot and just at the ankle.   Labs on Admission: I have personally reviewed following labs and imaging studies  CBC: Recent Labs  Lab 08/13/19 0908 08/16/19 0230  WBC 15.6* 8.8  NEUTROABS 11.5* 3.6  HGB 13.1 13.0  HCT 40.2 38.3*  MCV 95.5 94.6  PLT 165 [id number]   Basic Metabolic Panel: Recent Labs  Lab 08/16/19 0230  NA 137  K 3.8  CL 102  CO2 27  GLUCOSE 111*  BUN 8  CREATININE 0.94  CALCIUM 9.2   GFR: Estimated Creatinine Clearance: 83.3 mL/min (by C-G formula based on SCr of 0.94 mg/dL). Liver Function Tests: Recent Labs  Lab 08/16/19 0230  AST 37  ALT 34  ALKPHOS 85  BILITOT 1.0  PROT 7.2  ALBUMIN 3.7   No results for input(s): LIPASE, AMYLASE in the last 168 hours. No results for input(s): AMMONIA in the last 168 hours. Coagulation Profile: No results for input(s): INR, PROTIME in the last 168 hours. Cardiac Enzymes: No results for input(s): CKTOTAL, CKMB, CKMBINDEX, TROPONINI in the last 168 hours. BNP (last 3 results) No results for input(s): PROBNP in the last 8760 hours. HbA1C: No results for input(s): HGBA1C in the last 72 hours. CBG: No results for input(s): GLUCAP in the last 168 hours. Lipid Profile: No results for input(s): CHOL, HDL, LDLCALC, TRIG, CHOLHDL, LDLDIRECT in the last 72 hours. Thyroid Function Tests: No results for input(s): TSH, T4TOTAL, FREET4, T3FREE, THYROIDAB in the last 72 hours. Anemia Panel: No results for input(s): VITAMINB12, FOLATE, FERRITIN, TIBC, IRON, RETICCTPCT in the last 72 hours. Urine analysis:    Component Value Date/Time   COLORURINE YELLOW 12/24/2018 0948   APPEARANCEUR CLEAR 12/24/2018 0948   APPEARANCEUR Hazy 05/20/2011 2130   LABSPEC 1.020 12/24/2018 0948   LABSPEC 1.021 05/20/2011 2130   PHURINE 7.0 12/24/2018 0948   GLUCOSEU NEGATIVE 12/24/2018 0948   GLUCOSEU Negative 05/20/2011 2130   HGBUR TRACE (A) 12/24/2018 0948   BILIRUBINUR NEGATIVE 12/24/2018 0948   BILIRUBINUR Negative 05/20/2011  2130   KETONESUR NEGATIVE 12/24/2018 0948   PROTEINUR NEGATIVE 12/24/2018 0948   NITRITE NEGATIVE 12/24/2018 0948   LEUKOCYTESUR NEGATIVE 12/24/2018 0948   LEUKOCYTESUR Negative 05/20/2011 2130    Radiological Exams on Admission: DG Foot Complete Right  Result Date: 08/16/2019 CLINICAL DATA:  RIGHT foot pain, redness, swelling for 2-3 days. Unable to bear weight. EXAM: RIGHT FOOT COMPLETE - 3+ VIEW COMPARISON:  None. FINDINGS: Osseous alignment is normal. Bone mineralization is normal. No fracture line or displaced fracture fragment seen. No acute or suspicious osseous lesion. No destructive change or demineralization to suggest osteomyelitis. Chronic enthesophytes at the dorsal and plantar margins of the posterior calcaneus. Mild degenerative spurring within the midfoot. Soft tissues about the RIGHT foot are unremarkable. No soft tissue gas seen. IMPRESSION: No acute findings. No osseous fracture or dislocation. No evidence of osteomyelitis. Soft tissues about the RIGHT foot appear normal. Electronically Signed   By: Franki Cabot M.D.   On: 08/16/2019 05:16    EKG: Independently reviewed.   Assessment/Plan Principal Problem:   Sepsis ([place])   Cellulitis of right lower extremity --Sepsis criteria included fever tachycardia and lactic acid 2.6.  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

## 2019-08-16 NOTE — ED Triage Notes (Signed)
Pt actively receiving chemo, last chemo a week ago. Per family pt with temp 100.5 at home. Pt complains of bilateral foot swelling and redness. Pt denies cough, nausea, vomiting, diarrhea.

## 2019-08-16 NOTE — Progress Notes (Signed)
Pharmacy Antibiotic Note  Jared Gutierrez is a 79 y.o. male admitted on 08/16/2019 with sepsis.  Pharmacy has been consulted for Vancomycin and Cefepime dosing.  Plan: Zosyn 3.375g IV q8h (4 hour infusion).   Vancomycin 1750 mg IV Q 24 hrs. Goal AUC 400-550. Expected AUC: 497.6, Css 10.9 SCr used: 0.94   Height: 5\' 8"  (172.7 cm) Weight: 124.7 kg (275 lb) IBW/kg (Calculated) : 68.4  Temp (24hrs), Avg:98.3 F (36.8 C), Min:98.3 F (36.8 C), Max:98.3 F (36.8 C)  Recent Labs  Lab 08/13/19 0908 08/16/19 0230  WBC 15.6* 8.8  CREATININE  --  0.94  LATICACIDVEN  --  2.6*    Estimated Creatinine Clearance: 83.3 mL/min (by C-G formula based on SCr of 0.94 mg/dL).    No Known Allergies  Antimicrobials this admission:   >>    >>   Dose adjustments this admission:   Microbiology results:  BCx:   UCx:    Sputum:    MRSA PCR:   Thank you for allowing pharmacy to be a part of this patient's care.  Hart Robinsons A 08/16/2019 5:40 AM

## 2019-08-16 NOTE — Progress Notes (Signed)
PHARMACIST - PHYSICIAN COMMUNICATION  CONCERNING:  Enoxaparin (Lovenox) for DVT Prophylaxis    RECOMMENDATION: Patient was prescribed enoxaprin 40mg  q24 hours for VTE prophylaxis.   Filed Weights   08/16/19 0223  Weight: 124.7 kg (275 lb)    Body mass index is 41.81 kg/m.  Estimated Creatinine Clearance: 83.3 mL/min (by C-G formula based on SCr of 0.94 mg/dL).   Based on [place] patient is candidate for enoxaparin 40mg  every 12 hour dosing due to BMI being >40.   DESCRIPTION: Pharmacy has adjusted enoxaparin dose per [facility] policy.  Patient is now receiving enoxaparin 40mg  every 12 hours.    Ena Dawley, PharmD Clinical Pharmacist  08/16/2019 5:45 AM

## 2019-08-17 DIAGNOSIS — C189 Malignant neoplasm of colon, unspecified: Secondary | ICD-10-CM | POA: Diagnosis not present

## 2019-08-17 DIAGNOSIS — L03115 Cellulitis of right lower limb: Secondary | ICD-10-CM | POA: Diagnosis not present

## 2019-08-17 DIAGNOSIS — I10 Essential (primary) hypertension: Secondary | ICD-10-CM | POA: Diagnosis not present

## 2019-08-17 DIAGNOSIS — M109 Gout, unspecified: Secondary | ICD-10-CM | POA: Diagnosis not present

## 2019-08-17 LAB — BASIC METABOLIC PANEL
Anion gap: 6 (ref 5–15)
BUN: 11 mg/dL (ref 8–23)
CO2: 24 mmol/L (ref 22–32)
Calcium: 8.8 mg/dL — ABNORMAL LOW (ref 8.9–10.3)
Chloride: 109 mmol/L (ref 98–111)
Creatinine, Ser: 0.76 mg/dL (ref 0.61–1.24)
GFR calc Af Amer: >60 mL/min (ref >60)
GFR calc non Af Amer: >60 mL/min (ref >60)
Glucose, Bld: 92 mg/dL (ref 70–99)
Potassium: 3.8 mmol/L (ref 3.5–5.1)
Sodium: 139 mmol/L (ref 135–145)

## 2019-08-17 LAB — PROTIME-INR
INR: 1.3 — ABNORMAL HIGH (ref 0.8–1.2)
Prothrombin Time: 15.7 seconds — ABNORMAL HIGH (ref 11.4–15.2)

## 2019-08-17 MED ORDER — INDOMETHACIN 50 MG PO CAPS: 50.0000 mg | ORAL_CAPSULE | Freq: Three times a day (TID) | ORAL | 0 refills | Status: AC

## 2019-08-17 MED ORDER — CEPHALEXIN 500 MG PO CAPS: 500.0000 mg | ORAL_CAPSULE | Freq: Three times a day (TID) | ORAL | 0 refills | Status: AC

## 2019-08-17 NOTE — Plan of Care (Signed)
  Problem: Clinical Measurements: Goal: Diagnostic test results will improve Outcome: Progressing Goal: Signs and symptoms of infection will decrease Outcome: Progressing   Problem: Education: Goal: Knowledge of General Education information will improve Description: Including pain rating scale, medication(s)/side effects and non-pharmacologic comfort measures Outcome: Progressing   Problem: Clinical Measurements: Goal: Will remain free from infection Outcome: Progressing   Problem: Activity: Goal: Risk for activity intolerance will decrease Outcome: Progressing   Problem: Safety: Goal: Ability to remain free from injury will improve Outcome: Progressing

## 2019-08-17 NOTE — Progress Notes (Signed)
Pt is being discharged home.  Discharge papers given and explained to pt.  Pt verbalized understanding.  Meds and f/u appointments reviewed.  Rx sent electronically to pharmacy.  Pt and spouse made aware.

## 2019-08-17 NOTE — Discharge Summary (Signed)
Physician Discharge Summary  Jared Gutierrez [id number] DOB: [birth year] DOA: 08/16/2019  PCP: Tracie Harrier, MD  Admit date: 08/16/2019 Discharge date: 08/17/2019  Admitted From: Home Disposition: Home   Recommendations for Outpatient Follow-up:  1. Follow up with PCP and oncology in 1-2 weeks 2. In light of venous pulsatility on venous LE U/S, would consider echocardiogram as an outpatient.  Home Health: None Equipment/Devices: None Discharge Condition: Stable CODE STATUS: Full Diet recommendation: Heart healthy  Brief/Interim Summary: Jared Gutierrez is a 79 y.o. male with a history of metastatic colon CA undergoing chemotherapy, HTN, and gout who presented to the ED 4/3 with oral temperature of 100.627F at home in the setting of having awoken with pain in the right foot greater than left foot on 4/1 accompanied by progressive redness and swelling. He came to the ED because he's a chemo patient and told to present for temperature over 100.27F, doesn't feel particularly ill. Afebrile with stable vital signs on arrival, WBC 8.8k. Lactic acid was 2.6 and normalized with IVF, other labs unremarkable. IV fluids and broad antibiotics started, hospitalist called for admission for sepsis due to cellulitis.    Jared Gutierrez and his wife at the bedside confirm the history as above. The pain in his feet was so bad he couldn't walk on Thursday morning, trying to bear weight with a walker now. He confirms he has a history of gout and he and his wife thought this episode could be due to that. No personal or FH blood clots. He sleeps on an incline, but has done so for many months without change. Denies any wounds or bug bites.  NSAID and colchicine was given, antibiotics narrowed, and the patient's redness and pain have dramatically improved. He is able to bear weight and is stable for discharge.  Discharge Diagnoses:  Principal Problem:   Acute gout flare (sepsis ruled out) Active Problems:    Hypertension   Cellulitis of right lower extremity   Maintenance chemotherapy   Colon cancer metastasized to multiple sites [facility])  Acute gout flare in MTP R > L with possible cellulitis dorsum of right foot: Significantly improved. No DVT on LE venous U/S. - Continue NSAID, give indomethacin for at most 5 days.  - Given immunocompromised status, will continue keflex for nonpurulent cellulitis.  - Uric acid not grossly elevated, so ULT not started during flare. Would recommend rechecking and starting as outpatient.   Obesity: Body mass index is 41.81 kg/m.   Colon cancer:  - Continue to follow up with oncology as an outpatient.   Discharge Instructions Discharge Instructions    Call MD for:  temperature >100.4   Complete by: As directed    Discharge instructions   Complete by: As directed    You were admitted for concern of a skin infection in the feet. You certainly have gout which has shown significant improvement with oral antiinflammatories, and due to immunocompromised status, should continue also taking antibiotics for 5 days.  - For gout, take indomethacin 50mg  three times per day with meals. You should take this for 5 days at most. Do not take ibuprofen or other NSAIDs at the same time as this medication.  - For possible cellulitis, finish a course of antibiotics with keflex 500mg  three times daily for 5 days. - Follow up with your PCP and/or oncologist in the next week for recheck, or seek medical attention sooner if your symptoms do not continue improving.     Allergies as of 08/17/2019  No Known Allergies     Medication List    STOP taking these medications   ibuprofen 800 MG tablet Commonly known as: ADVIL     TAKE these medications   amLODipine 2.5 MG tablet Commonly known as: NORVASC Take 2.5 mg by mouth daily.   aspirin EC 81 MG tablet Take 81 mg by mouth daily.   cephALEXin 500 MG capsule Commonly known as: KEFLEX Take 1 capsule (500 mg total) by mouth 3  (three) times daily for 5 days.   HYDROcodone-acetaminophen 5-325 MG tablet Commonly known as: NORCO/VICODIN Take 1 tablet by mouth every 6 (six) hours as needed for moderate pain (up to 3 doses for moderate pain.).   indomethacin 50 MG capsule Commonly known as: INDOCIN Take 1 capsule (50 mg total) by mouth 3 (three) times daily with meals for 5 days.   lidocaine-prilocaine cream Commonly known as: EMLA Apply to affected area once   loratadine 10 MG tablet Commonly known as: CLARITIN Take 10 mg by mouth daily.   olmesartan 20 MG tablet Commonly known as: BENICAR Take 20 mg by mouth daily.   ondansetron 8 MG tablet Commonly known as: Zofran Take 1 tablet (8 mg total) by mouth 2 (two) times daily as needed for refractory nausea / vomiting. Start on day 3 after chemotherapy.   PROBIOTIC DAILY PO Take 1 tablet by mouth daily.   Vitamin D 125 MCG (5000 UT) Caps Take 5,000 mg by mouth.      Follow-up Information    Tracie Harrier, MD. Schedule an appointment as soon as possible for a visit.   Specialty: Internal Medicine Contact information: [place] [address] [phone]        Lequita Asal, MD. Schedule an appointment as soon as possible for a visit.   Specialty: Hematology and Oncology Contact information: [place] [address] [phone]          No Known Allergies  Consultations:  None  Procedures/Studies: US Venous Img Lower Bilateral (DVT)  Result Date: 08/16/2019 CLINICAL DATA:  79 year old male with elevated D-dimer and lower extremity edema EXAM: BILATERAL LOWER EXTREMITY VENOUS DOPPLER ULTRASOUND TECHNIQUE: Gray-scale sonography with graded compression, as well as color Doppler and duplex ultrasound were performed to evaluate the lower extremity deep venous systems from the level of the common femoral vein and including the common femoral, femoral, profunda femoral, popliteal and calf veins  including the posterior tibial, peroneal and gastrocnemius veins when visible. The superficial great saphenous vein was also interrogated. Spectral Doppler was utilized to evaluate flow at rest and with distal augmentation maneuvers in the common femoral, femoral and popliteal veins. COMPARISON:  None. FINDINGS: RIGHT LOWER EXTREMITY Common Femoral Vein: No evidence of thrombus. Normal compressibility, respiratory phasicity and response to augmentation. Saphenofemoral Junction: No evidence of thrombus. Normal compressibility and flow on color Doppler imaging. Profunda Femoral Vein: No evidence of thrombus. Normal compressibility and flow on color Doppler imaging. Femoral Vein: No evidence of thrombus. Normal compressibility, respiratory phasicity and response to augmentation. Popliteal Vein: No evidence of thrombus. Normal compressibility, respiratory phasicity and response to augmentation. Calf Veins: No evidence of thrombus. Normal compressibility and flow on color Doppler imaging. Superficial Great Saphenous Vein: No evidence of thrombus. Normal compressibility. Venous Reflux:  None. Other Findings:  None. LEFT LOWER EXTREMITY Common Femoral Vein: No evidence of thrombus. Normal compressibility, respiratory phasicity and response to augmentation. Saphenofemoral Junction: No evidence of thrombus. Normal compressibility and flow on color Doppler  imaging. Profunda Femoral Vein: No evidence of thrombus. Normal compressibility and flow on color Doppler imaging. Femoral Vein: No evidence of thrombus. Normal compressibility, respiratory phasicity and response to augmentation. Popliteal Vein: No evidence of thrombus. Normal compressibility, respiratory phasicity and response to augmentation. Calf Veins: No evidence of thrombus. Normal compressibility and flow on color Doppler imaging. Superficial Great Saphenous Vein: No evidence of thrombus. Normal compressibility. Venous Reflux:  None. Other Findings:  None.  IMPRESSION: No evidence of deep venous thrombosis in either lower extremity. Increased pulsatility of the venous waveforms bilaterally. These findings suggest elevated right heart pressures. Differential considerations include tricuspid regurgitation, right heart failure, pulmonary arterial hypertension and chronic COPD. Electronically Signed   By: Jacqulynn Cadet M.D.   On: 08/16/2019 16:34   DG Foot Complete Right  Result Date: 08/16/2019 CLINICAL DATA:  RIGHT foot pain, redness, swelling for 2-3 days. Unable to bear weight. EXAM: RIGHT FOOT COMPLETE - 3+ VIEW COMPARISON:  None. FINDINGS: Osseous alignment is normal. Bone mineralization is normal. No fracture line or displaced fracture fragment seen. No acute or suspicious osseous lesion. No destructive change or demineralization to suggest osteomyelitis. Chronic enthesophytes at the dorsal and plantar margins of the posterior calcaneus. Mild degenerative spurring within the midfoot. Soft tissues about the RIGHT foot are unremarkable. No soft tissue gas seen. IMPRESSION: No acute findings. No osseous fracture or dislocation. No evidence of osteomyelitis. Soft tissues about the RIGHT foot appear normal. Electronically Signed   By: Franki Cabot M.D.   On: 08/16/2019 05:16     Subjective: Pain is significantly better, able to ambulate. No fever, feels well, eating well, wants to go home.  Discharge Exam: Vitals:   08/17/19 0003 08/17/19 0815  BP: (!) 145/91 140/79  Pulse: 78 78  Resp: 17 20  Temp: 98.2 F (36.8 C) 98.1 F (36.7 C)  SpO2: 96% 96%   General: Pt is alert, awake, not in acute distress Cardiovascular: RRR, S1/S2 +, no rubs, no gallops Respiratory: CTA bilaterally, no wheezing, no rhonchi Abdominal: Soft, NT, ND, bowel sounds + Extremities: Mild right foot edema (much improved) mild erythema along left great toe MTP and less so right great toe MTP, much less tender to palpation.   Labs: Basic Metabolic Panel: Recent Labs   Lab 08/16/19 0230 08/17/19 0519  NA 137 139  K 3.8 3.8  CL 102 109  CO2 27 24  GLUCOSE 111* 92  BUN 8 11  CREATININE 0.94 0.76  CALCIUM 9.2 8.8*   Liver Function Tests: Recent Labs  Lab 08/16/19 0230  AST 37  ALT 34  ALKPHOS 85  BILITOT 1.0  PROT 7.2  ALBUMIN 3.7   No results for input(s): LIPASE, AMYLASE in the last 168 hours. No results for input(s): AMMONIA in the last 168 hours. CBC: Recent Labs  Lab 08/13/19 0908 08/16/19 0230  WBC 15.6* 8.8  NEUTROABS 11.5* 3.6  HGB 13.1 13.0  HCT 40.2 38.3*  MCV 95.5 94.6  PLT 165 150   Cardiac Enzymes: No results for input(s): CKTOTAL, CKMB, CKMBINDEX, TROPONINI in the last 168 hours. BNP: Invalid input(s): POCBNP CBG: No results for input(s): GLUCAP in the last 168 hours. D-Dimer No results for input(s): DDIMER in the last 72 hours. Hgb A1c No results for input(s): HGBA1C in the last 72 hours. Lipid Profile No results for input(s): CHOL, HDL, LDLCALC, TRIG, CHOLHDL, LDLDIRECT in the last 72 hours. Thyroid function studies No results for input(s): TSH, T4TOTAL, T3FREE, THYROIDAB in the last 72 hours.  Invalid input(s): FREET3 Anemia work up No results for input(s): VITAMINB12, FOLATE, FERRITIN, TIBC, IRON, RETICCTPCT in the last 72 hours. Urinalysis    Component Value Date/Time   COLORURINE YELLOW 12/24/2018 0948   APPEARANCEUR CLEAR 12/24/2018 0948   APPEARANCEUR Hazy 05/20/2011 2130   LABSPEC 1.020 12/24/2018 0948   LABSPEC 1.021 05/20/2011 2130   PHURINE 7.0 12/24/2018 0948   GLUCOSEU NEGATIVE 12/24/2018 0948   GLUCOSEU Negative 05/20/2011 2130   HGBUR TRACE (A) 12/24/2018 0948   BILIRUBINUR NEGATIVE 12/24/2018 0948   BILIRUBINUR Negative 05/20/2011 2130   KETONESUR NEGATIVE 12/24/2018 0948   PROTEINUR NEGATIVE 12/24/2018 0948   NITRITE NEGATIVE 12/24/2018 0948   LEUKOCYTESUR NEGATIVE 12/24/2018 0948   LEUKOCYTESUR Negative 05/20/2011 2130    Microbiology Recent Results (from the past 240  hour(s))  Blood culture (routine x 2)     Status: None (Preliminary result)   Collection Time: 08/16/19  2:30 AM   Specimen: BLOOD  Result Value Ref Range Status   Specimen Description BLOOD RIGHT [facility]  Final   Special Requests   Final    BOTTLES DRAWN AEROBIC AND ANAEROBIC Blood Culture adequate volume   Culture   Final    NO GROWTH 1 DAY Performed at [facility], [address], [place], [place] [address]    Report Status PENDING  Incomplete  Blood culture (routine x 2)     Status: None (Preliminary result)   Collection Time: 08/16/19  4:00 AM   Specimen: BLOOD  Result Value Ref Range Status   Specimen Description BLOOD RIGHT [facility]  Final   Special Requests   Final    BOTTLES DRAWN AEROBIC AND ANAEROBIC Blood Culture adequate volume   Culture   Final    NO GROWTH 1 DAY Performed at [facility], [address], [place], [place] [address]    Report Status PENDING  Incomplete  SARS CORONAVIRUS 2 (TAT 6-24 HRS) Nasopharyngeal Nasopharyngeal Swab     Status: None   Collection Time: 08/16/19  7:00 AM   Specimen: Nasopharyngeal Swab  Result Value Ref Range Status   SARS Coronavirus 2 NEGATIVE NEGATIVE Final    Comment: (NOTE) SARS-CoV-2 target nucleic acids are NOT DETECTED. The SARS-CoV-2 RNA is generally detectable in upper and lower respiratory specimens during the acute phase of infection. Negative results do not preclude SARS-CoV-2 infection, do not rule out co-infections with other pathogens, and should not be used as the sole basis for treatment or other patient management decisions. Negative results must be combined with clinical observations, patient history, and epidemiological information. The expected result is Negative. Fact Sheet for Patients: SugarRoll.be Fact Sheet for Healthcare Providers: https://www.woods-mathews.com/ This test is not yet approved or cleared by the [place] FDA and  has been  authorized for detection and/or diagnosis of SARS-CoV-2 by FDA under an Emergency Use Authorization (EUA). This EUA will remain  in effect (meaning this test can be used) for the duration of the COVID-19 declaration under Section 56 4(b)(1) of the Act, 21 U.S.C. section 360bbb-3(b)(1), unless the authorization is terminated or revoked sooner. Performed at [facility], [place] [address], [place],  [address]     Time coordinating discharge: Approximately 40 minutes  Patrecia Pour, MD  [facility] 08/17/2019, 1:06 PM

## 2019-08-17 NOTE — Care Management CC44 (Signed)
Condition Code 44 Documentation Completed  Patient Details  Name: JANIE MISSEL MRN: [id number] Date of Birth: 1940-11-21   Condition Code 44 given:  Yes Patient signature on Condition Code 44 notice:  Yes Documentation of 2 MD's agreement:  Yes Code 44 added to claim:  Yes    Boris Sharper, LCSW 08/17/2019, 9:05 AM

## 2019-08-18 NOTE — Progress Notes (Signed)
[facility]  [address], [address], [place] [address] Phone: [phone]  Fax: [phone]   Clinic Day:  08/19/2019  Referring physician: Tracie Harrier, MD  Chief Complaint: Jared Gutierrez is a 79 y.o. male with metastaticcolon cancer who is seen for reassessment after interval hospitalization for cellulitis.    HPI: The patient was last seen in the medical oncology clinic on 08/06/2019. At that time, he was doing well. He noted a transient cold neuropathy. He denied any numbness or tingling today. Exam was stable. Hematocrit 36.2, hemoglobin 12.0, platelets 143,000, WBC 3,400 (ANC 1,600).  He received cycle #4 FOLFOX chemotherapy followed by Neulasta on 08/08/2019 with 5FU pump disconnect.   Labs on 08/13/2019 included hematocrit 40.2, hemoglobin 13.1, platelets 165,000, WBC 15,600 (ANC 11,500).   He was seen in the symptoms management clinic by Beckey Rutter, NP on 08/14/2019 for bilateral feet swelling and painful toes. He had not been taking Claritin s/p Neulasta. He noted aching pain in his hips. His wife was concerned about swelling in his feet and legs (chronic).  His muscle aches and pains were new.  Exam revealed chronic lower extremity edema with right > left. Ultrasound on 12/24/2018 was negative for DVT. He denied any fever or chills. He was advised to take Tylenol and Claritin for pain and increase oral hydration.  If lower extremity edema persisted, an ultrasound would be considered.   He was admitted to [facility] from 08/16/2019 - 08/17/2019 with right lower extremity cellulitis.  He noted pain in his right foot with progressive redness and swelling.  He had difficulty ambulating as he was unable to bear weight; he relied on a walker.  Temperature was 100.5Fat home. WBC was 8,800. Lactic acid was 2.6 (elevated). Plain films of his foot revealed no acute changes.  He was treated with IV fluids, NSAIDs, colchicine, and broad antibiotics.    Bilateral lower extremity ultrasound on 08/16/2019 revealed no evidence of deep venous thrombosis in either lower extremity. There was increased pulsatility of the venous waveforms bilaterally. These findings suggested elevated right heart pressures. Differential considerations included tricuspid regurgitation, right heart failure, pulmonary arterial hypertension and chronic COPD.  The redness and pain dramatically improved. He was able to bear weight.  He was instructed to take indomethacin 50 mg TID with meals x 5 days and no ibuprofen or other NSAIDs. For his cellulitis, he was prescribed Keflex 500 mg TID x 5 days.  During the interim, the patient was doing well. He is still on antibiotics since discharge. His right foot pain is much better. He has some pain when walking. The redness has improved and the swelling is improving.  He bowels are good.  He denies any diarrhea.    Past Medical History:  Diagnosis Date  . Arthritis    OSTEOARTHRITIS  . Cancer ([name])   . Cavitary lesion of lung    RIGHT LOWER LOBE  . Chicken pox   . Colon cancer ([name])   . History of kidney stones   . Hypertension   . Lipoma of colon   . Nephrolithiasis   . Nephrolithiasis   . Obesity   . Shingles   . Tubular adenoma of colon    multiple fragments    Past Surgical History:  Procedure Laterality Date  . COLON SURGERY    . COLONOSCOPY N/A 10/02/2014   Procedure: COLONOSCOPY;  Surgeon: Josefine Class, MD;  Location[facility] ENDOSCOPY;  Service: Endoscopy;  Laterality: N/A;  . COLONOSCOPY  WITH PROPOFOL N/A 02/16/2017   Procedure: COLONOSCOPY WITH PROPOFOL;  Surgeon: Manya Silvas, MD;  Location: [facility] ENDOSCOPY;  Service: Endoscopy;  Laterality: N/A;  . EUS N/A 04/17/2019   Procedure: FULL UPPER ENDOSCOPIC ULTRASOUND (EUS) RADIAL;  Surgeon: Holly Bodily, MD;  Location: [facility] ENDOSCOPY;  Service: Gastroenterology;  Laterality: N/A;  . KIDNEY STONE SURGERY    . PARTIAL COLECTOMY  10/17/2013  .  PORTACATH PLACEMENT Right 06/13/2019   Procedure: INSERTION PORT-A-CATH;  Surgeon: Benjamine Sprague, DO;  Location: [facility] ORS;  Service: General;  Laterality: Right;    Family History  Problem Relation Age of Onset  . Cancer Mother   . Breast cancer Mother   . COPD Father     Social History:  reports that he has never smoked. He has never used smokeless tobacco. He reports current alcohol use. He reports that he does not use drugs. 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He is married and his wife's name is Jared Gutierrez [phone]). The patient is alone today.  Allergies: No Known Allergies  Current Medications: Current Outpatient Medications  Medication Sig Dispense Refill  . amLODipine (NORVASC) 2.5 MG tablet Take 2.5 mg by mouth daily.     [name] aspirin EC 81 MG tablet Take 81 mg by mouth daily.    . cephALEXin (KEFLEX) 500 MG capsule Take 1 capsule (500 mg total) by mouth 3 (three) times daily for 5 days. 15 capsule 0  . Cholecalciferol (VITAMIN D) 125 MCG (5000 UT) CAPS Take 5,000 mg by mouth.    [name] HYDROcodone-acetaminophen (NORCO/VICODIN) 5-325 MG tablet Take 1 tablet by mouth every 6 (six) hours as needed for moderate pain (up to 3 doses for moderate pain.).     [name] indomethacin (INDOCIN) 50 MG capsule Take 1 capsule (50 mg total) by mouth 3 (three) times daily with meals for 5 days. 15 capsule 0  . loratadine (CLARITIN) 10 MG tablet Take 10 mg by mouth daily.    [name] olmesartan (BENICAR) 20 MG tablet Take 20 mg by mouth daily.    . Probiotic Product (PROBIOTIC DAILY PO) Take 1 tablet by mouth daily.    [name] lidocaine-prilocaine (EMLA) cream Apply to affected area once (Patient not taking: Reported on 08/19/2019) 30 g 3  . ondansetron (ZOFRAN) 8 MG tablet Take 1 tablet (8 mg total) by mouth 2 (two) times daily as needed for refractory nausea / vomiting. Start on day 3 after chemotherapy. (Patient not taking: Reported on 08/19/2019) 30 tablet 1   No current  facility-administered medications for this visit.    Review of Systems  Constitutional: Positive for weight loss (1 lb). Negative for chills, diaphoresis, fever and malaise/fatigue.       Doing well. Ambulating with a cane.  HENT: Negative for congestion, ear pain, nosebleeds, sinus pain, sore throat and tinnitus.   Eyes: Negative.  Negative for blurred vision and double vision.  Respiratory: Negative.  Negative for cough, sputum production and shortness of breath.   Cardiovascular: Positive for leg swelling (right foot). Negative for chest pain and palpitations.  Gastrointestinal: Negative for abdominal pain, blood in stool, constipation, diarrhea (transient), heartburn, melena, nausea (well managed with ondansetron) and vomiting.       Normal bowels.  Genitourinary: Negative.  Negative for dysuria, frequency, hematuria and urgency.  Musculoskeletal: Positive for joint pain (chronic bilateral knee pain; bilateral feet; pain increases with walking). Negative for back pain, falls and myalgias.  Skin: Negative.  Negative  for rash.  Neurological: Negative.  Negative for weakness and headaches.  Endo/Heme/Allergies: Negative.  Does not bruise/bleed easily.  Psychiatric/Behavioral: Negative.  Negative for depression. The patient is not nervous/anxious and does not have insomnia.   All other systems reviewed and are negative.  Performance status (ECOG): 1  Vitals Blood pressure 138/76, pulse 68, temperature 97.7 F (36.5 C), temperature source Tympanic, resp. rate 18, height 5' 8"  (1.727 m), weight 277 lb (125.6 kg), SpO2 99 %.   Physical Exam  Constitutional: He is oriented to person, place, and time. Vital signs are normal. He appears well-developed and well-nourished. No distress.  He has a cane at his side.  HENT:  Head: Normocephalic and atraumatic.  Mouth/Throat: Oropharynx is clear and moist. No oropharyngeal exudate.  Short gray hair.  Mask.  Eyes: Pupils are equal, round, and  reactive to light. Conjunctivae and EOM are normal. No scleral icterus.  Neck: No JVD present.  Cardiovascular: Normal rate, regular rhythm and normal heart sounds.  No murmur heard. Pulmonary/Chest: Effort normal and breath sounds normal. No respiratory distress. He has no wheezes. He has no rales. He exhibits no tenderness.  Abdominal: Soft. Bowel sounds are normal. He exhibits no distension and no mass. There is no abdominal tenderness. There is no rebound and no guarding.  Musculoskeletal:        General: Edema (chronic lower extremity changes; right > left foot) present. No tenderness. Normal range of motion.     Cervical back: Normal range of motion and neck supple.     Comments: Right great toe non-tender to palpation.  Lymphadenopathy:       Head (right side): No preauricular, no posterior auricular and no occipital adenopathy present.       Head (left side): No preauricular, no posterior auricular and no occipital adenopathy present.    He has no cervical adenopathy.    He has no axillary adenopathy.       Right: No supraclavicular adenopathy present.       Left: No supraclavicular adenopathy present.  Neurological: He is alert and oriented to person, place, and time.  Able to wiggle toes.  Skin: Skin is warm and dry. No rash noted. He is not diaphoretic. No erythema. No pallor.  Right big toe ruddy in color- improved since admission.  Psychiatric: He has a normal mood and affect. His behavior is normal. Judgment and thought content normal.  Nursing note and vitals reviewed.        Admission on 08/16/2019, Discharged on 08/17/2019  Component Date Value Ref Range Status  . Lactic Acid, Venous 08/16/2019 2.6* 0.5 - 1.9 mmol/L Final   Comment: CRITICAL RESULT CALLED TO, READ BACK BY AND VERIFIED WITH APRIL BRUMGARD ON 08/16/19 AT 0319 [facility] Performed at [facility], [address] [place], [place] [address]   . Lactic Acid, Venous 08/16/2019 1.3  0.5 - 1.9 mmol/L  Final   Performed at [facility], [address] [place],  [address]  . Sodium 08/16/2019 137  135 - 145 mmol/L Final  . Potassium 08/16/2019 3.8  3.5 - 5.1 mmol/L Final  . Chloride 08/16/2019 102  98 - 111 mmol/L Final  . CO2 08/16/2019 27  22 - 32 mmol/L Final  . Glucose, Bld 08/16/2019 111* 70 - 99 mg/dL Final   Glucose reference range applies only to samples taken after fasting for at least 8 hours.  . BUN 08/16/2019 8  8 - 23 mg/dL Final  . Creatinine, Ser  08/16/2019 0.94  0.61 - 1.24 mg/dL Final  . Calcium 08/16/2019 9.2  8.9 - 10.3 mg/dL Final  . Total Protein 08/16/2019 7.2  6.5 - 8.1 g/dL Final  . Albumin 08/16/2019 3.7  3.5 - 5.0 g/dL Final  . AST 08/16/2019 37  15 - 41 U/L Final  . ALT 08/16/2019 34  0 - 44 U/L Final  . Alkaline Phosphatase 08/16/2019 85  38 - 126 U/L Final  . Total Bilirubin 08/16/2019 1.0  0.3 - 1.2 mg/dL Final  . GFR calc non Af Amer 08/16/2019 >60  >60 mL/min Final  . GFR calc Af Amer 08/16/2019 >60  >60 mL/min Final  . Anion gap 08/16/2019 8  5 - 15 Final   Performed at [facility], [address] [place], [place] [address]  . WBC 08/16/2019 8.8  4.0 - 10.5 K/uL Final  . RBC 08/16/2019 4.05* 4.22 - 5.81 MIL/uL Final  . Hemoglobin 08/16/2019 13.0  13.0 - 17.0 g/dL Final  . HCT 08/16/2019 38.3* 39.0 - 52.0 % Final  . MCV 08/16/2019 94.6  80.0 - 100.0 fL Final  . MCH 08/16/2019 32.1  26.0 - 34.0 pg Final  . MCHC 08/16/2019 33.9  30.0 - 36.0 g/dL Final  . RDW 08/16/2019 14.2  11.5 - 15.5 % Final  . Platelets 08/16/2019 150  150 - 400 K/uL Final  . nRBC 08/16/2019 0.8* 0.0 - 0.2 % Final  . Neutrophils Relative % 08/16/2019 40  % Final  . Neutro Abs 08/16/2019 3.6  1.7 - 7.7 K/uL Final  . Lymphocytes Relative 08/16/2019 33  % Final  . Lymphs Abs 08/16/2019 2.9  0.7 - 4.0 K/uL Final  . Monocytes Relative 08/16/2019 18  % Final  . Monocytes Absolute 08/16/2019 1.6* 0.1 - 1.0 K/uL Final  . Eosinophils Relative 08/16/2019 0  %  Final  . Eosinophils Absolute 08/16/2019 0.0  0.0 - 0.5 K/uL Final  . Basophils Relative 08/16/2019 2  % Final  . Basophils Absolute 08/16/2019 0.1  0.0 - 0.1 K/uL Final  . WBC Morphology 08/16/2019 MILD LEFT SHIFT (1-5% METAS, OCC MYELO, OCC BANDS)   Final   TOXIC GRANULATION  . RBC Morphology 08/16/2019 See Note   Final  . Smear Review 08/16/2019 MORPHOLOGY UNREMARKABLE   Final  . Immature Granulocytes 08/16/2019 7  % Final  . Abs Immature Granulocytes 08/16/2019 0.59* 0.00 - 0.07 K/uL Final  . Polychromasia 08/16/2019 PRESENT   Final   Performed at [facility], [address] [place], [place] [address]  . Specimen Description 08/16/2019 BLOOD RIGHT AC   Final  . Special Requests 08/16/2019 BOTTLES DRAWN AEROBIC AND ANAEROBIC Blood Culture adequate volume   Final  . Culture 08/16/2019    Final                   Value:NO GROWTH 3 DAYS Performed at [facility], [address] [place], [place] [address]   . Report Status 08/16/2019 PENDING   Incomplete  . Specimen Description 08/16/2019 BLOOD RIGHT AC   Final  . Special Requests 08/16/2019 BOTTLES DRAWN AEROBIC AND ANAEROBIC Blood Culture adequate volume   Final  . Culture 08/16/2019    Final                   Value:NO GROWTH 3 DAYS Performed at [facility], [address] [place], [place] [address]   . Report Status 08/16/2019 PENDING   Incomplete  . SARS Coronavirus 2  08/16/2019 NEGATIVE  NEGATIVE Final   Comment: (NOTE) SARS-CoV-2 target nucleic acids are NOT DETECTED. The SARS-CoV-2 RNA is generally detectable in upper and lower respiratory specimens during the acute phase of infection. Negative results do not preclude SARS-CoV-2 infection, do not rule out co-infections with other pathogens, and should not be used as the sole basis for treatment or other patient management decisions. Negative results must be combined with clinical observations, patient history, and epidemiological information.  The expected result is Negative. Fact Sheet for Patients: SugarRoll.be Fact Sheet for Healthcare Providers: https://www.woods-mathews.com/ This test is not yet approved or cleared by the [place] FDA and  has been authorized for detection and/or diagnosis of SARS-CoV-2 by FDA under an Emergency Use Authorization (EUA). This EUA will remain  in effect (meaning this test can be used) for the duration of the COVID-19 declaration under Section 56                          4(b)(1) of the Act, 21 U.S.C. section 360bbb-3(b)(1), unless the authorization is terminated or revoked sooner. Performed at [facility], [place] [address] [place], [place] [address]   . Fibrin derivatives D-dimer ([facility]) 08/16/2019 5,308.110* 0.00 - 499.00 ng/mL (FEU) Final   Comment: (NOTE) <> Exclusion of Venous Thromboembolism (VTE) - OUTPATIENT ONLY   (Emergency Department or [place])   0-499 ng/ml (FEU): With a low to intermediate pretest probability                      for VTE this test result excludes the diagnosis                      of VTE.   >499 ng/ml (FEU) : VTE not excluded; additional work up for VTE is                      required. <> Testing on Inpatients and Evaluation of Disseminated Intravascular   Coagulation (DIC) Reference Range:   0-499 ng/ml (FEU) Performed at [facility], [address] [place],  [address]   . Procalcitonin 08/16/2019 0.26  ng/mL Final   Comment:        Interpretation: PCT (Procalcitonin) <= 0.5 ng/mL: Systemic infection (sepsis) is not likely. Local bacterial infection is possible. (NOTE)       Sepsis PCT Algorithm           Lower Respiratory Tract                                      Infection PCT Algorithm    ----------------------------     ----------------------------         PCT < 0.25 ng/mL                PCT < 0.10 ng/mL         Strongly encourage             Strongly discourage    discontinuation of antibiotics    initiation of antibiotics    ----------------------------     -----------------------------       PCT 0.25 - 0.50 ng/mL            PCT 0.10 - 0.25 ng/mL               OR       >  80% decrease in PCT            Discourage initiation of                                            antibiotics      Encourage discontinuation           of antibiotics    ----------------------------     -----------------------------         PCT >= 0.50 ng/mL              PCT 0.26 - 0.50 ng/mL               AND                                 <80% decrease in PCT             Encourage initiation of                                             antibiotics       Encourage continuation           of antibiotics    ----------------------------     -----------------------------        PCT >= 0.50 ng/mL                  PCT > 0.50 ng/mL               AND         increase in PCT                  Strongly encourage                                      initiation of antibiotics    Strongly encourage escalation           of antibiotics                                     -----------------------------                                           PCT <= 0.25 ng/mL                                                 OR                                        > 80% decrease in PCT  Discontinue / Do not initiate                                             antibiotics Performed at [facility], [address] [place], [place] [address]   . Uric Acid, Serum 08/16/2019 7.4  3.7 - 8.6 mg/dL Final   Performed at [facility], [address] [place], [place] [address]  . Prothrombin Time 08/17/2019 15.7* 11.4 - 15.2 seconds Final  . INR 08/17/2019 1.3* 0.8 - 1.2 Final   Comment: (NOTE) INR goal varies based on device and disease states. Performed at [facility], [address], [place], [place] [address]   . Sodium 08/17/2019 139   135 - 145 mmol/L Final  . Potassium 08/17/2019 3.8  3.5 - 5.1 mmol/L Final  . Chloride 08/17/2019 109  98 - 111 mmol/L Final  . CO2 08/17/2019 24  22 - 32 mmol/L Final  . Glucose, Bld 08/17/2019 92  70 - 99 mg/dL Final   Glucose reference range applies only to samples taken after fasting for at least 8 hours.  . BUN 08/17/2019 11  8 - 23 mg/dL Final  . Creatinine, Ser 08/17/2019 0.76  0.61 - 1.24 mg/dL Final  . Calcium 08/17/2019 8.8* 8.9 - 10.3 mg/dL Final  . GFR calc non Af Amer 08/17/2019 >60  >60 mL/min Final  . GFR calc Af Amer 08/17/2019 >60  >60 mL/min Final  . Anion gap 08/17/2019 6  5 - 15 Final   Performed at [facility], [address] [place], [place] [address]    Assessment:  Jared Gutierrez is a 79 y.o. male withmetastatic colon cancer. He was diagnosed with stage I colon cancers/p transverse colectomy on 10/17/2013. 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 Tumor extended into the submucosa. Margins were negative. There was lymphovascular invasion. 14 lymph nodes were negative.Pathologic stagewas T1 N0.  Colonoscopyon 10/02/2014 noted several 3 mm polyps and an 8 mm polyp in the cecum and transverse colon. Pathology revealed tubular adenomas negative for high-grade dysplasia and malignancy. Colonoscopyon 02/16/2017 revealed 2 diminutive polyps in the descending colon. Pathology revealed tubular adenomas without dysplasia or malignancy.  CEAhas been followed: 3.1 on 04/27/2014, 2.3 on 11/11/2014, 3.0 on 05/12/2015, 2.7 on 11/10/2015, 3.2 on 05/10/2016, 2.8 on 11/01/2016, 3.3 on 04/25/2017, 3.1 on 10/31/2017, 4.0 on 05/01/2018, 7.0on 11/28/2018, 7.4 on 12/13/2018, 10.5 on 03/25/2019, 17.8 on 06/25/2019, 16.2 on 07/09/2019, 13.8 on 07/23/2019, and 11.9 on 07/30/2019.  Chest, abdomen and pelvis CTon 12/20/2018 revealed postoperative findings of transverse  colon resection without evidence of recurrent mass, definite lymphadenopathy, or distant metastatic disease to explain rising CEA. Therewereprominent sub-centimeter retroperitoneal and iliac lymph nodes, uncertain significance. There were postoperative findings about the left renal hilum, of uncertain nature, with a broad-based postoperative fat containing left-sided lumbar hernia.There was bibasilar bronchiectasis and scarring with multiple post infectious or inflammatory pneumatoceles, unchanged in comparison to CT date 06/23/2011.  PET scanon 04/02/2019 revealed a2.4 x 2.3 cm (SUV 11)hypermetabolic soft tissue density caudal and anterior to the pancreatic neckfavored to represent isolated peritoneal or nodal metastasis in the setting of prior transverse colonic resection (expected primary drainage). Although this was immediately adjacent to the pancreas, a fat plane was maintained, arguing strongly against a pancreatic primary. Otherwise,there wasno evidence of hypermetabolic  metastasis.  Upper endoscopic ultrasoundon 04/17/2019 revealeda normal esophagus, stomach, duodenum, and pancreas.There was a 2.4 x 2.4 cm irregularmassin the retroperitoneum adjacent to, but not involving the pancreatic neck. FNA and core needle biopsy were performed. Pathologyrevealed adenocarcinoma compatible with a metastatic lesion of colorectal origin. Tumor cells were positive for CK20 and CDX2andnegative for CK7.  Omniseqon 05/15/2019 revealed + KRAS and TP53. Negative results included BRAF V600E, Her2, NRAS, NTRK, PD-L1 (<1%), and TMB 8.7/Mb (intermediate). MMR testingfrom his colon resection on 10/16/2013 was intact with a low probability of MSI-H.  He is s/p 4 cycles of FOLFOXchemotherapy (07/09/2019 - 08/06/2019).  He received Neulasta after cycle #4 secondary to progressive leukopenia.  He has chronic right lower extremity edema. Right lower extremity duplex on 12/24/2018 revealed no  DVT. He has a small right Baker's cyst.  Bilateral lower extremity duplex on 08/16/2019 revealed no evidence of deep venous thrombosis in either lower extremity. Increased pulsatility of the venous waveforms bilaterally. These findings suggested elevated right heart pressures. Differential considerations included tricuspid regurgitation, right heart failure, pulmonary arterial hypertension and chronic COPD.  He was admitted to [facility] from 08/16/2019 - 08/17/2019 with right lower extremity cellulitis.  He was unable to bear weight.  He was treated with IV fluids, NSAIDs, colchicine, and broad antibiotics (vancomycin and Cefepime).  He was discharged on indomethacin x 5 days and Keflex 500 mg TID x 5 days.  He has received his COVID-19 vaccine.  Symptomatically, he is feeling better.  Right great toe has improved on antibiotics and indomethacin.   Plan: 1.   Review interval hospitalization. 2.   Metastaticcolon cancer Clinically, he is doing well. He initially presented with stage I disease s/p resection. PET scan on 04/02/2019 revealeda2.4 x 2.3 cm (SUV 11)soft tissue density caudal and anterior to the pancreatic neck. 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 Lesion was not resectable.             NGS revealed no targetable mutation.  MMR on original resection revealed a low probability of MSI-H.             He declined Avastin.             He is s/p 4 cycle of FOLFOX chemotherapy.                         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                         He received Neulasta with cycle #4.              Discuss future Neulasta with each cycle depending on counts.             Discuss symptom management.  He has antiemetics at home to use on a prn bases.  Interventions are adequate.         3.    Cellulitis             Right great toe has improved on antibiotics.  He has also been treated for possible gout.  Anticipate initiation of chemotherapy next week. 4.   Patient to call with fever or any concerns. 5.   RTC on 08/27/2019 for MD assessment, labs (CBC with diff, CMP, Mg, CEA), and cycle #5 FOLFOX chemotherapy.  I discussed the assessment and  treatment plan with the patient.  The patient was provided an opportunity to ask questions and all were answered.  The patient agreed with the plan and demonstrated an understanding of the instructions.  The patient was advised to call back if the symptoms worsen or if the condition fails to improve as anticipated.  I provided 10 minutes of face-to-face time during this this encounter and > 50% was spent counseling as documented under my assessment and plan.    Lequita Asal, MD, PhD    08/19/2019, 11:51 AM  I, Selena Batten, am acting as scribe for [employer]. Mike Gip, MD, PhD.  I, Megham Dwyer C. Mike Gip, MD, have reviewed the above documentation for accuracy and completeness, and I agree with the above.

## 2019-08-19 ENCOUNTER — Inpatient Hospital Stay (HOSPITAL_BASED_OUTPATIENT_CLINIC_OR_DEPARTMENT_OTHER): Payer: Medicare Other | Admitting: Hematology and Oncology

## 2019-08-19 ENCOUNTER — Encounter: Payer: Self-pay | Admitting: Hematology and Oncology

## 2019-08-19 ENCOUNTER — Other Ambulatory Visit: Payer: Self-pay

## 2019-08-19 VITALS — BP 138/76 | HR 68 | Temp 97.7°F | Resp 18 | Ht 68.0 in | Wt 277.0 lb

## 2019-08-19 DIAGNOSIS — L03115 Cellulitis of right lower limb: Secondary | ICD-10-CM | POA: Diagnosis not present

## 2019-08-19 DIAGNOSIS — C184 Malignant neoplasm of transverse colon: Secondary | ICD-10-CM | POA: Diagnosis not present

## 2019-08-19 DIAGNOSIS — Z5111 Encounter for antineoplastic chemotherapy: Secondary | ICD-10-CM | POA: Diagnosis not present

## 2019-08-19 DIAGNOSIS — C786 Secondary malignant neoplasm of retroperitoneum and peritoneum: Secondary | ICD-10-CM

## 2019-08-19 NOTE — Progress Notes (Signed)
The patient states he is still having pain in the right foot 2-3 but better. The patient was started on Keflex 500 mg TID start date 08-17-2019 and stop date [id number] and Indocin 50 mg TID start date 08-17-2019 and stop date 08-22-2019.

## 2019-08-20 ENCOUNTER — Inpatient Hospital Stay: Payer: Medicare Other

## 2019-08-20 ENCOUNTER — Inpatient Hospital Stay: Payer: Medicare Other | Admitting: Hematology and Oncology

## 2019-08-20 NOTE — Progress Notes (Signed)
Pharmacist Chemotherapy Monitoring - Follow Up Assessment    I verify that I have reviewed each item in the below checklist:  . Regimen for the patient is scheduled for the appropriate day and plan matches scheduled date. [name] Appropriate non-routine labs are ordered dependent on drug ordered. . If applicable, additional medications reviewed and ordered per protocol based on lifetime cumulative doses and/or treatment regimen.   Plan for follow-up and/or issues identified: No . I-vent associated with next due treatment: No . MD and/or nursing notified: No  Jared Gutierrez 08/20/2019 8:54 AM

## 2019-08-21 ENCOUNTER — Ambulatory Visit: Payer: Medicare Other

## 2019-08-21 ENCOUNTER — Other Ambulatory Visit: Payer: Medicare Other

## 2019-08-21 ENCOUNTER — Ambulatory Visit: Payer: Medicare Other | Admitting: Hematology and Oncology

## 2019-08-21 LAB — CULTURE, BLOOD (ROUTINE X 2)
Culture: NO GROWTH
Culture: NO GROWTH
Special Requests: ADEQUATE
Special Requests: ADEQUATE

## 2019-08-22 ENCOUNTER — Inpatient Hospital Stay: Payer: Medicare Other

## 2019-08-25 NOTE — Progress Notes (Signed)
[facility]  [address] [place], [place] [address] Phone: [phone]  Fax: [phone]   Clinic Day:  08/27/2019  Referring physician: Tracie Harrier, MD  Chief Complaint: Jared Gutierrez is a 79 y.o. male with metastaticcolon cancer who is seen for assessment prior to cycle #5 FOLFOX chemotherapy.    HPI: The patient was last seen in the medical oncology clinic on 08/19/2019. At that time, he was completing a course of antibiotics for cellulitis of the right foot following discharge from the hospital.  The redness and swelling in his right foot was improving.    During the interim, he says that he feels good today. He is able to walk on his foot pretty good. There is a little swelling in both of his feet. He denies any pain in his feet. He finished his antibiotics.  He denies any concern about weight loss; he says 267 lb is a normal weight for him. He is unsure why he may have lost 10 pounds. His bowels are normal.  He denies any shortness of breath or chest pain. He notes that the cold causes tingling in his toes; other than that he denies any neuropathy in his fingers and toes.   He is ready to start his next cycle of chemotherapy.   Past Medical History:  Diagnosis Date  . Arthritis    OSTEOARTHRITIS  . Cancer ([place])   . Cavitary lesion of lung    RIGHT LOWER LOBE  . Chicken pox   . Colon cancer ([place])   . History of kidney stones   . Hypertension   . Lipoma of colon   . Nephrolithiasis   . Nephrolithiasis   . Obesity   . Shingles   . Tubular adenoma of colon    multiple fragments    Past Surgical History:  Procedure Laterality Date  . COLON SURGERY    . COLONOSCOPY N/A 10/02/2014   Procedure: COLONOSCOPY;  Surgeon: Josefine Class, MD;  Location[facility] ENDOSCOPY;  Service: Endoscopy;  Laterality: N/A;  . COLONOSCOPY WITH PROPOFOL N/A 02/16/2017   Procedure: COLONOSCOPY WITH PROPOFOL;  Surgeon: Manya Silvas, MD;   Location: [facility] ENDOSCOPY;  Service: Endoscopy;  Laterality: N/A;  . EUS N/A 04/17/2019   Procedure: FULL UPPER ENDOSCOPIC ULTRASOUND (EUS) RADIAL;  Surgeon: Holly Bodily, MD;  Location: [facility] ENDOSCOPY;  Service: Gastroenterology;  Laterality: N/A;  . KIDNEY STONE SURGERY    . PARTIAL COLECTOMY  10/17/2013  . PORTACATH PLACEMENT Right 06/13/2019   Procedure: INSERTION PORT-A-CATH;  Surgeon: Benjamine Sprague, DO;  Location: [facility] ORS;  Service: General;  Laterality: Right;    Family History  Problem Relation Age of Onset  . Cancer Mother   . Breast cancer Mother   . COPD Father     Social History:  reports that he has never smoked. He has never used smokeless tobacco. He reports current alcohol use. He reports that he does not use drugs. 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He is married and his wife's name is [name] [phone]).  The patient is alone  today.  Allergies: No Known Allergies  Current Medications: Current Outpatient Medications  Medication Sig Dispense Refill  . amLODipine (NORVASC) 2.5 MG tablet Take 2.5 mg by mouth daily.     [name] aspirin EC 81 MG tablet Take 81 mg by mouth daily.    . Cholecalciferol (VITAMIN D) 125 MCG (5000 UT) CAPS Take 5,000 mg  by mouth.    [name] HYDROcodone-acetaminophen (NORCO/VICODIN) 5-325 MG tablet Take 1 tablet by mouth every 6 (six) hours as needed for moderate pain (up to 3 doses for moderate pain.).     [name] lidocaine-prilocaine (EMLA) cream Apply to affected area once 30 g 3  . loratadine (CLARITIN) 10 MG tablet Take 10 mg by mouth daily.    [name] olmesartan (BENICAR) 20 MG tablet Take 20 mg by mouth daily.    . Probiotic Product (PROBIOTIC DAILY PO) Take 1 tablet by mouth daily.    . ondansetron (ZOFRAN) 8 MG tablet Take 1 tablet (8 mg total) by mouth 2 (two) times daily as needed for refractory nausea / vomiting. Start on day 3 after chemotherapy. (Patient not taking: Reported on 08/27/2019) 30 tablet 1   No current  facility-administered medications for this visit.   Facility-Administered Medications Ordered in Other Visits  Medication Dose Route Frequency Provider Last Rate Last Admin  . heparin lock flush 100 unit/mL  500 Units Intravenous Once Brentt Fread C, MD      . sodium chloride flush (NS) 0.9 % injection 10 mL  10 mL Intravenous PRN Lequita Asal, MD   10 mL at 08/27/19 0856    Review of Systems  Constitutional: Positive for weight loss (10 lb). Negative for chills, diaphoresis, fever and malaise/fatigue.       Feels "pretty good".  HENT: Negative.  Negative for congestion, ear pain, nosebleeds, sore throat and tinnitus.   Eyes: Negative.  Negative for blurred vision, double vision and photophobia.  Respiratory: Negative.  Negative for cough, sputum production and shortness of breath.   Cardiovascular: Positive for leg swelling (little swelling on top of foot). Negative for chest pain, palpitations, orthopnea and PND.  Gastrointestinal: Negative for abdominal pain, blood in stool, constipation, diarrhea, nausea (well managed with ondansetron) and vomiting.       Normal bowels.  Genitourinary: Negative.  Negative for dysuria, frequency, hematuria and urgency.  Musculoskeletal: Positive for joint pain (chronic bilateral knee pain). Negative for back pain, falls and myalgias.       Ambulates well.  Skin: Negative.  Negative for rash.  Neurological: Negative for dizziness, weakness and headaches.  Psychiatric/Behavioral: Negative for depression. The patient is not nervous/anxious and does not have insomnia.   All other systems reviewed and are negative.  Performance status (ECOG):  1  Vitals Blood pressure (!) 142/79, pulse 83, temperature (!) 96.6 F (35.9 C), temperature source Tympanic, resp. rate 18, weight 267 lb (121.1 kg), SpO2 99 %.   Physical Exam  Constitutional: He is oriented to person, place, and time. He appears well-developed and well-nourished. No distress. Face  mask in place.  He has a cane at his side.  HENT:  Head: Normocephalic and atraumatic.  Right Ear: Hearing normal.  Left Ear: Hearing normal.  Mouth/Throat: Oropharynx is clear and moist and mucous membranes are normal. No oral lesions.  Short gray hair and beard.  Eyes: Pupils are equal, round, and reactive to light. Conjunctivae and EOM are normal. Right eye exhibits no discharge. Left eye exhibits no discharge. No scleral icterus.  Neck: No JVD present.  Cardiovascular: Normal rate, regular rhythm and normal heart sounds. Exam reveals no gallop and no friction rub.  No murmur heard. Pulmonary/Chest: Effort normal and breath sounds normal. No respiratory distress. He has no wheezes. He has no rhonchi. He has no rales.  Abdominal: Soft. Normal appearance and bowel sounds are normal. He exhibits no distension and no  mass. There is no hepatosplenomegaly. There is no abdominal tenderness. There is no rebound, no guarding and no CVA tenderness.  Musculoskeletal:        General: Edema (chronic lower extremity changes) present. No tenderness. Normal range of motion.     Cervical back: Normal range of motion and neck supple.  Lymphadenopathy:       Head (right side): No preauricular, no posterior auricular and no occipital adenopathy present.       Head (left side): No preauricular, no posterior auricular and no occipital adenopathy present.    He has no cervical adenopathy.       Right cervical: No superficial cervical adenopathy present.   He has no axillary adenopathy.       Right: No inguinal and no supraclavicular adenopathy present.       Left: No inguinal and no supraclavicular adenopathy present.  Neurological: He is alert and oriented to person, place, and time. No sensory deficit.  Skin: Skin is warm, dry and intact. No bruising, no lesion and no rash noted. He is not diaphoretic. No erythema. No pallor.  Toes are normal.  Psychiatric: He has a normal mood and affect. His behavior is  normal. Judgment and thought content normal.  Nursing note and vitals reviewed.   Infusion on 08/27/2019  Component Date Value Ref Range Status  . WBC 08/27/2019 8.8  4.0 - 10.5 K/uL Final  . RBC 08/27/2019 3.77* 4.22 - 5.81 MIL/uL Final  . Hemoglobin 08/27/2019 11.9* 13.0 - 17.0 g/dL Final  . HCT 08/27/2019 35.8* 39.0 - 52.0 % Final  . MCV 08/27/2019 95.0  80.0 - 100.0 fL Final  . MCH 08/27/2019 31.6  26.0 - 34.0 pg Final  . MCHC 08/27/2019 33.2  30.0 - 36.0 g/dL Final  . RDW 08/27/2019 14.8  11.5 - 15.5 % Final  . Platelets 08/27/2019 177  150 - 400 K/uL Final  . nRBC 08/27/2019 0.0  0.0 - 0.2 % Final  . Neutrophils Relative % 08/27/2019 64  % Final  . Neutro Abs 08/27/2019 5.7  1.7 - 7.7 K/uL Final  . Lymphocytes Relative 08/27/2019 25  % Final  . Lymphs Abs 08/27/2019 2.2  0.7 - 4.0 K/uL Final  . Monocytes Relative 08/27/2019 8  % Final  . Monocytes Absolute 08/27/2019 0.7  0.1 - 1.0 K/uL Final  . Eosinophils Relative 08/27/2019 1  % Final  . Eosinophils Absolute 08/27/2019 0.1  0.0 - 0.5 K/uL Final  . Basophils Relative 08/27/2019 1  % Final  . Basophils Absolute 08/27/2019 0.0  0.0 - 0.1 K/uL Final  . Immature Granulocytes 08/27/2019 1  % Final  . Abs Immature Granulocytes 08/27/2019 0.08* 0.00 - 0.07 K/uL Final   Performed at [facility], [address], [place], [place] [address]    Assessment:  Jared Gutierrez is a 79 y.o. male  withmetastatic colon cancer. He was diagnosed with stage I colon cancers/p transverse colectomy on 10/17/2013. 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 Tumor extended into the submucosa. Margins were negative. There was lymphovascular invasion. 14 lymph nodes were negative.Pathologic stagewas T1 N0.  Colonoscopyon 10/02/2014 noted several 3 mm polyps and an 8 mm polyp in the cecum and transverse colon. Pathology revealed  tubular adenomas negative for high-grade dysplasia and malignancy. Colonoscopyon 02/16/2017 revealed 2 diminutive polyps in the descending colon. Pathology revealed tubular adenomas without dysplasia or malignancy.  CEAhas been followed: 3.1  on 04/27/2014, 2.3 on 11/11/2014, 3.0 on 05/12/2015, 2.7 on 11/10/2015, 3.2 on 05/10/2016, 2.8 on 11/01/2016, 3.3 on 04/25/2017, 3.1 on 10/31/2017, 4.0 on 05/01/2018, 7.0on 11/28/2018, 7.4 on 12/13/2018, 10.5 on 03/25/2019, 17.8on 06/25/2019, 16.2 on 07/09/2019, 13.8 on 07/23/2019, 11.9 on 07/30/2019, and 6.1 on 08/27/2019.  Chest, abdomen and pelvis CTon 12/20/2018 revealed postoperative findings of transverse colon resection without evidence of recurrent mass, definite lymphadenopathy, or distant metastatic disease to explain rising CEA. Therewereprominent sub-centimeter retroperitoneal and iliac lymph nodes, uncertain significance. There were postoperative findings about the left renal hilum, of uncertain nature, with a broad-based postoperative fat containing left-sided lumbar hernia.There was bibasilar bronchiectasis and scarring with multiple post infectious or inflammatory pneumatoceles, unchanged in comparison to CT date 06/23/2011.  PET scanon 04/02/2019 revealed a2.4 x 2.3 cm (SUV 11)hypermetabolic soft tissue density caudal and anterior to the pancreatic neckfavored to represent isolated peritoneal or nodal metastasis in the setting of prior transverse colonic resection (expected primary drainage). Although this was immediately adjacent to the pancreas, a fat plane was maintained, arguing strongly against a pancreatic primary. Otherwise,there wasno evidence of hypermetabolic metastasis.  Upper endoscopic ultrasoundon 04/17/2019 revealeda normal esophagus, stomach, duodenum, and pancreas.There was a 2.4 x 2.4 cm irregularmassin the retroperitoneum adjacent to, but not involving the pancreatic neck. FNA and core needle biopsy were  performed. Pathologyrevealed adenocarcinoma compatible with a metastatic lesion of colorectal origin. Tumor cells were positive for CK20 and CDX2andnegative for CK7.  Omniseqon 05/15/2019 revealed + KRAS and TP53. Negative results included BRAF V600E, Her2, NRAS, NTRK, PD-L1 (<1%), and TMB 8.7/Mb (intermediate). MMR testingfrom his colon resection on 10/16/2013 was intact with a low probability of MSI-H.  He is s/p4cycles of FOLFOXchemotherapy (07/09/2019- 08/06/2019).  He received Neulasta after cycle #4 secondary to progressive leukopenia.  He has chronic right lower extremity edema. Right lower extremity duplex on 12/24/2018 revealed no DVT. He has a small right Baker's cyst.  Bilateral lower extremity duplex on 08/16/2019 revealed no evidence of deep venous thrombosis in either lower extremity. Increased pulsatility of the venous waveforms bilaterally. These findings suggested elevated right heart pressures. Differential considerations included tricuspid regurgitation, right heart failure, pulmonary arterial hypertension and chronic COPD.  He was admitted to [facility] from 08/16/2019 - 08/17/2019 with right lower extremity cellulitis.  He was unable to bear weight.  He was treated with IV fluids, NSAIDs, colchicine, and broad antibiotics (vancomycin and Cefepime).  He was discharged on indomethacin x 5 days and Keflex 500 mg TID x 5 days.  He has received his COVID-19 vaccine.  Symptomatically, he is doing well.  He is back to baseline.  Exam is unremarkable.  Plan: 1.   Labs today:  CBC with diff, CMP, Mg, CEA. 2.   Metastaticcolon cancer Clinically, he is doing well. He initially presented with stage I disease s/p resection. PET scan on 04/02/2019 revealeda2.4 x 2.3 cm (SUV 11)soft tissue density caudal and anterior to the pancreatic neck. Biopsy of mass anterior to the pancreas confirmed adenocarcinoma c/w colorectal  origin. Lesion was not resectable. NGS revealed no targetable mutation. MMR on original resection revealed a a low probability of MSI-H. He declined Avastin. He is s/p 4cycles of FOLFOX chemotherapy. He received Neulasta with cycle #4.  CEA has decreased from 17.8 to 6.1.  Discuss counts today.  He may not need Neulasta with this cycle.  Patient considering. Labs reviewed.  Cycle #5 FOLFOX chemotherapy today. Discuss symptom management.  He has antiemetics at home to use on a prn bases.  Interventions are adequate.   .  3. Hypokalemia, resolved Potassium 3.6. Continue to monitor. 4. Weight loss Weight is down 10 pounds.  Patient denies any diarrhea.  Patient feels like this is a good weight and has no concerns.  Continue to monitor. 5.   Right great toe cellulitis  Exam reveals resolution of edema and erythema.  Continue to monitor off antibiotics. 6.Cycle #5 FOLFOX chemotherapy. 7.   RTC in 2 days for pump disconnect and +/- Neulasta. 8.   RTC in 1 week for labs (CBC with diff). 9.   RTC in 2 weeks for MD assessment, labs (CBC with diff, CMP, Mg, CEA), and cycle #6 FOLFOX chemotherapy.  Addendum:  Per nursing, patient decided to receive Neulasta on pump disconnect.  He believes his great toe symptoms were related to Neulasta.  Previously he was treated for cellulitis (fever and erythema) as well as gout.  He was reminded to take Claritin to prevent Neulasta induced bone pain.  Monitor closely.  I discussed the assessment and treatment plan with the patient.  The patient was provided an opportunity to ask questions and all were answered.  The patient agreed with the plan and demonstrated an understanding of the instructions.  The patient was advised to call back if the symptoms worsen or if the condition fails to improve as  anticipated.   Lequita Asal, MD, PhD    08/27/2019, 9:17 AM  I, Heywood Footman, am acting as Education administrator for [employer]. Mike Gip, MD, PhD.  I, Couper Juncaj C. Mike Gip, MD, have reviewed the above documentation for accuracy and completeness, and I agree with the above.

## 2019-08-27 ENCOUNTER — Inpatient Hospital Stay: Payer: Medicare Other

## 2019-08-27 ENCOUNTER — Other Ambulatory Visit: Payer: Self-pay

## 2019-08-27 ENCOUNTER — Encounter: Payer: Self-pay | Admitting: Hematology and Oncology

## 2019-08-27 ENCOUNTER — Inpatient Hospital Stay (HOSPITAL_BASED_OUTPATIENT_CLINIC_OR_DEPARTMENT_OTHER): Payer: Medicare Other | Admitting: Hematology and Oncology

## 2019-08-27 VITALS — BP 142/79 | HR 83 | Temp 96.6°F | Resp 18 | Wt 267.0 lb

## 2019-08-27 DIAGNOSIS — C184 Malignant neoplasm of transverse colon: Secondary | ICD-10-CM

## 2019-08-27 DIAGNOSIS — Z85038 Personal history of other malignant neoplasm of large intestine: Secondary | ICD-10-CM

## 2019-08-27 DIAGNOSIS — C786 Secondary malignant neoplasm of retroperitoneum and peritoneum: Secondary | ICD-10-CM

## 2019-08-27 DIAGNOSIS — Z5111 Encounter for antineoplastic chemotherapy: Secondary | ICD-10-CM

## 2019-08-27 LAB — CBC WITH DIFFERENTIAL/PLATELET
Abs Immature Granulocytes: 0.08 10*3/uL — ABNORMAL HIGH (ref 0.00–0.07)
Basophils Absolute: 0 10*3/uL (ref 0.0–0.1)
Basophils Relative: 1 %
Eosinophils Absolute: 0.1 10*3/uL (ref 0.0–0.5)
Eosinophils Relative: 1 %
HCT: 35.8 % — ABNORMAL LOW (ref 39.0–52.0)
Hemoglobin: 11.9 g/dL — ABNORMAL LOW (ref 13.0–17.0)
Immature Granulocytes: 1 %
Lymphocytes Relative: 25 %
Lymphs Abs: 2.2 10*3/uL (ref 0.7–4.0)
MCH: 31.6 pg (ref 26.0–34.0)
MCHC: 33.2 g/dL (ref 30.0–36.0)
MCV: 95 fL (ref 80.0–100.0)
Monocytes Absolute: 0.7 10*3/uL (ref 0.1–1.0)
Monocytes Relative: 8 %
Neutro Abs: 5.7 10*3/uL (ref 1.7–7.7)
Neutrophils Relative %: 64 %
Platelets: 177 10*3/uL (ref 150–400)
RBC: 3.77 MIL/uL — ABNORMAL LOW (ref 4.22–5.81)
RDW: 14.8 % (ref 11.5–15.5)
WBC: 8.8 10*3/uL (ref 4.0–10.5)
nRBC: 0 % (ref 0.0–0.2)

## 2019-08-27 LAB — COMPREHENSIVE METABOLIC PANEL
ALT: 31 U/L (ref 0–44)
AST: 35 U/L (ref 15–41)
Albumin: 3.4 g/dL — ABNORMAL LOW (ref 3.5–5.0)
Alkaline Phosphatase: 57 U/L (ref 38–126)
Anion gap: 8 (ref 5–15)
BUN: 11 mg/dL (ref 8–23)
CO2: 27 mmol/L (ref 22–32)
Calcium: 9.4 mg/dL (ref 8.9–10.3)
Chloride: 103 mmol/L (ref 98–111)
Creatinine, Ser: 0.87 mg/dL (ref 0.61–1.24)
GFR calc Af Amer: >60 mL/min (ref >60)
GFR calc non Af Amer: >60 mL/min (ref >60)
Glucose, Bld: 144 mg/dL — ABNORMAL HIGH (ref 70–99)
Potassium: 3.6 mmol/L (ref 3.5–5.1)
Sodium: 138 mmol/L (ref 135–145)
Total Bilirubin: 0.8 mg/dL (ref 0.3–1.2)
Total Protein: 7.2 g/dL (ref 6.5–8.1)

## 2019-08-27 LAB — MAGNESIUM: Magnesium: 1.5 mg/dL — ABNORMAL LOW (ref 1.7–2.4)

## 2019-08-27 MED ORDER — PALONOSETRON HCL INJECTION 0.25 MG/5ML
0.2500 mg | Freq: Once | INTRAVENOUS | Status: AC
  Administered 2019-08-27: 0.25 mg via INTRAVENOUS
  Filled 2019-08-27: qty 5

## 2019-08-27 MED ORDER — SODIUM CHLORIDE 0.9 % IV SOLN
5900.0000 mg | INTRAVENOUS | Status: DC
  Administered 2019-08-27: 5900 mg via INTRAVENOUS
  Filled 2019-08-27: qty 118

## 2019-08-27 MED ORDER — SODIUM CHLORIDE 0.9% FLUSH
10.0000 mL | INTRAVENOUS | Status: DC | PRN
  Administered 2019-08-27: 10 mL via INTRAVENOUS
  Filled 2019-08-27: qty 10

## 2019-08-27 MED ORDER — LEUCOVORIN CALCIUM INJECTION 350 MG
1000.0000 mg | Freq: Once | INTRAVENOUS | Status: AC
  Administered 2019-08-27: 1000 mg via INTRAVENOUS
  Filled 2019-08-27: qty 50

## 2019-08-27 MED ORDER — OXALIPLATIN CHEMO INJECTION 100 MG/20ML
200.0000 mg | Freq: Once | INTRAVENOUS | Status: AC
  Administered 2019-08-27: 200 mg via INTRAVENOUS
  Filled 2019-08-27: qty 40

## 2019-08-27 MED ORDER — HEPARIN SOD (PORK) LOCK FLUSH 100 UNIT/ML IV SOLN
500.0000 [IU] | Freq: Once | INTRAVENOUS | Status: DC
  Filled 2019-08-27: qty 5

## 2019-08-27 MED ORDER — SODIUM CHLORIDE 0.9 % IV SOLN
10.0000 mg | Freq: Once | INTRAVENOUS | Status: AC
  Administered 2019-08-27: 10 mg via INTRAVENOUS
  Filled 2019-08-27: qty 10

## 2019-08-27 MED ORDER — FLUOROURACIL CHEMO INJECTION 2.5 GM/50ML
1000.0000 mg | Freq: Once | INTRAVENOUS | Status: AC
  Administered 2019-08-27: 1000 mg via INTRAVENOUS
  Filled 2019-08-27: qty 20

## 2019-08-27 MED ORDER — DEXTROSE 5 % IV SOLN
Freq: Once | INTRAVENOUS | Status: AC
  Filled 2019-08-27: qty 250

## 2019-08-27 MED ORDER — LEUCOVORIN CALCIUM INJECTION 350 MG
1000.0000 mg | Freq: Once | INTRAVENOUS | Status: DC
  Filled 2019-08-27 (×2): qty 50

## 2019-08-27 NOTE — Progress Notes (Signed)
Patient denies questions/concerns

## 2019-08-27 NOTE — Patient Instructions (Signed)
Fluorouracil, 5-FU injection What is this medicine? FLUOROURACIL, 5-FU (flure oh YOOR a sil) is a chemotherapy drug. It slows the growth of cancer cells. This medicine is used to treat many types of cancer like breast cancer, colon or rectal cancer, pancreatic cancer, and stomach cancer. This medicine may be used for other purposes; ask your health care provider or pharmacist if you have questions. COMMON BRAND NAME(S): Adrucil What should I tell my health care provider before I take this medicine? They need to know if you have any of these conditions:  blood disorders  dihydropyrimidine dehydrogenase (DPD) deficiency  infection (especially a virus infection such as chickenpox, cold sores, or herpes)  kidney disease  liver disease  malnourished, poor nutrition  recent or ongoing radiation therapy  an unusual or allergic reaction to fluorouracil, other chemotherapy, other medicines, foods, dyes, or preservatives  pregnant or trying to get pregnant  breast-feeding How should I use this medicine? This drug is given as an infusion or injection into a vein. It is administered in a hospital or clinic by a specially trained health care professional. Talk to your pediatrician regarding the use of this medicine in children. Special care may be needed. Overdosage: If you think you have taken too much of this medicine contact a poison control center or emergency room at once. NOTE: This medicine is only for you. Do not share this medicine with others. What if I miss a dose? It is important not to miss your dose. Call your doctor or health care professional if you are unable to keep an appointment. What may interact with this medicine?  allopurinol  cimetidine  dapsone  digoxin  hydroxyurea  leucovorin  levamisole  medicines for seizures like ethotoin, fosphenytoin, phenytoin  medicines to increase blood counts like filgrastim, pegfilgrastim, sargramostim  medicines that  treat or prevent blood clots like warfarin, enoxaparin, and dalteparin  methotrexate  metronidazole  pyrimethamine  some other chemotherapy drugs like busulfan, cisplatin, estramustine, vinblastine  trimethoprim  trimetrexate  vaccines Talk to your doctor or health care professional before taking any of these medicines:  acetaminophen  aspirin  ibuprofen  ketoprofen  naproxen This list may not describe all possible interactions. Give your health care provider a list of all the medicines, herbs, non-prescription drugs, or dietary supplements you use. Also tell them if you smoke, drink alcohol, or use illegal drugs. Some items may interact with your medicine. What should I watch for while using this medicine? Visit your doctor for checks on your progress. This drug may make you feel generally unwell. This is not uncommon, as chemotherapy can affect healthy cells as well as cancer cells. Report any side effects. Continue your course of treatment even though you feel ill unless your doctor tells you to stop. In some cases, you may be given additional medicines to help with side effects. Follow all directions for their use. Call your doctor or health care professional for advice if you get a fever, chills or sore throat, or other symptoms of a cold or flu. Do not treat yourself. This drug decreases your body's ability to fight infections. Try to avoid being around people who are sick. This medicine may increase your risk to bruise or bleed. Call your doctor or health care professional if you notice any unusual bleeding. Be careful brushing and flossing your teeth or using a toothpick because you may get an infection or bleed more easily. If you have any dental work done, tell your dentist you are  receiving this medicine. Avoid taking products that contain aspirin, acetaminophen, ibuprofen, naproxen, or ketoprofen unless instructed by your doctor. These medicines may hide a fever. Do not  become pregnant while taking this medicine. Women should inform their doctor if they wish to become pregnant or think they might be pregnant. There is a potential for serious side effects to an unborn child. Talk to your health care professional or pharmacist for more information. Do not breast-feed an infant while taking this medicine. Men should inform their doctor if they wish to father a child. This medicine may lower sperm counts. Do not treat diarrhea with over the counter products. Contact your doctor if you have diarrhea that lasts more than 2 days or if it is severe and watery. This medicine can make you more sensitive to the sun. Keep out of the sun. If you cannot avoid being in the sun, wear protective clothing and use sunscreen. Do not use sun lamps or tanning beds/booths. What side effects may I notice from receiving this medicine? Side effects that you should report to your doctor or health care professional as soon as possible:  allergic reactions like skin rash, itching or hives, swelling of the face, lips, or tongue  low blood counts - this medicine may decrease the number of white blood cells, red blood cells and platelets. You may be at increased risk for infections and bleeding.  signs of infection - fever or chills, cough, sore throat, pain or difficulty passing urine  signs of decreased platelets or bleeding - bruising, pinpoint red spots on the skin, black, tarry stools, blood in the urine  signs of decreased red blood cells - unusually weak or tired, fainting spells, lightheadedness  breathing problems  changes in vision  chest pain  mouth sores  nausea and vomiting  pain, swelling, redness at site where injected  pain, tingling, numbness in the hands or feet  redness, swelling, or sores on hands or feet  stomach pain  unusual bleeding Side effects that usually do not require medical attention (report to your doctor or health care professional if they  continue or are bothersome):  changes in finger or toe nails  diarrhea  dry or itchy skin  hair loss  headache  loss of appetite  sensitivity of eyes to the light  stomach upset  unusually teary eyes This list may not describe all possible side effects. Call your doctor for medical advice about side effects. You may report side effects to FDA at 1-800-FDA-1088. Where should I keep my medicine? This drug is given in a hospital or clinic and will not be stored at home. NOTE: This sheet is a summary. It may not cover all possible information. If you have questions about this medicine, talk to your doctor, pharmacist, or health care provider.  2020 [employer] Standard (2007-09-04 13:53:16) Leucovorin injection What is this medicine? LEUCOVORIN (loo koe VOR in) is used to prevent or treat the harmful effects of some medicines. This medicine is used to treat anemia caused by a low amount of folic acid in the body. It is also used with 5-fluorouracil (5-FU) to treat colon cancer. This medicine may be used for other purposes; ask your health care provider or pharmacist if you have questions. What should I tell my health care provider before I take this medicine? They need to know if you have any of these conditions:  anemia from low levels of vitamin B-12 in the blood  an unusual or allergic reaction to  leucovorin, folic acid, other medicines, foods, dyes, or preservatives  pregnant or trying to get pregnant  breast-feeding How should I use this medicine? This medicine is for injection into a muscle or into a vein. It is given by a health care professional in a hospital or clinic setting. Talk to your pediatrician regarding the use of this medicine in children. Special care may be needed. Overdosage: If you think you have taken too much of this medicine contact a poison control center or emergency room at once. NOTE: This medicine is only for you. Do not share this medicine with  others. What if I miss a dose? This does not apply. What may interact with this medicine?  capecitabine  fluorouracil  phenobarbital  phenytoin  primidone  trimethoprim-sulfamethoxazole This list may not describe all possible interactions. Give your health care provider a list of all the medicines, herbs, non-prescription drugs, or dietary supplements you use. Also tell them if you smoke, drink alcohol, or use illegal drugs. Some items may interact with your medicine. What should I watch for while using this medicine? Your condition will be monitored carefully while you are receiving this medicine. This medicine may increase the side effects of 5-fluorouracil, 5-FU. Tell your doctor or health care professional if you have diarrhea or mouth sores that do not get better or that get worse. What side effects may I notice from receiving this medicine? Side effects that you should report to your doctor or health care professional as soon as possible:  allergic reactions like skin rash, itching or hives, swelling of the face, lips, or tongue  breathing problems  fever, infection  mouth sores  unusual bleeding or bruising  unusually weak or tired Side effects that usually do not require medical attention (report to your doctor or health care professional if they continue or are bothersome):  constipation or diarrhea  loss of appetite  nausea, vomiting This list may not describe all possible side effects. Call your doctor for medical advice about side effects. You may report side effects to FDA at 1-800-FDA-1088. Where should I keep my medicine? This drug is given in a hospital or clinic and will not be stored at home. NOTE: This sheet is a summary. It may not cover all possible information. If you have questions about this medicine, talk to your doctor, pharmacist, or health care provider.  2020 [employer] Standard (2007-11-05 16:50:29) Oxaliplatin Injection What is this  medicine? OXALIPLATIN (ox AL i PLA tin) is a chemotherapy drug. It targets fast dividing cells, like cancer cells, and causes these cells to die. This medicine is used to treat cancers of the colon and rectum, and many other cancers. This medicine may be used for other purposes; ask your health care provider or pharmacist if you have questions. COMMON BRAND NAME(S): Eloxatin What should I tell my health care provider before I take this medicine? They need to know if you have any of these conditions:  heart disease  history of irregular heartbeat  liver disease  low blood counts, like white cells, platelets, or red blood cells  lung or breathing disease, like asthma  take medicines that treat or prevent blood clots  tingling of the fingers or toes, or other nerve disorder  an unusual or allergic reaction to oxaliplatin, other chemotherapy, other medicines, foods, dyes, or preservatives  pregnant or trying to get pregnant  breast-feeding How should I use this medicine? This drug is given as an infusion into a vein. It is administered  in a hospital or clinic by a specially trained health care professional. Talk to your pediatrician regarding the use of this medicine in children. Special care may be needed. Overdosage: If you think you have taken too much of this medicine contact a poison control center or emergency room at once. NOTE: This medicine is only for you. Do not share this medicine with others. What if I miss a dose? It is important not to miss a dose. Call your doctor or health care professional if you are unable to keep an appointment. What may interact with this medicine? Do not take this medicine with any of the following medications:  cisapride  dronedarone  pimozide  thioridazine This medicine may also interact with the following medications:  aspirin and aspirin-like medicines  certain medicines that treat or prevent blood clots like warfarin, apixaban,  dabigatran, and rivaroxaban  cisplatin  cyclosporine  diuretics  medicines for infection like acyclovir, adefovir, amphotericin B, bacitracin, cidofovir, foscarnet, ganciclovir, gentamicin, pentamidine, vancomycin  NSAIDs, medicines for pain and inflammation, like ibuprofen or naproxen  other medicines that prolong the QT interval (an abnormal heart rhythm)  pamidronate  zoledronic acid This list may not describe all possible interactions. Give your health care provider a list of all the medicines, herbs, non-prescription drugs, or dietary supplements you use. Also tell them if you smoke, drink alcohol, or use illegal drugs. Some items may interact with your medicine. What should I watch for while using this medicine? Your condition will be monitored carefully while you are receiving this medicine. You may need blood work done while you are taking this medicine. This medicine may make you feel generally unwell. This is not uncommon as chemotherapy can affect healthy cells as well as cancer cells. Report any side effects. Continue your course of treatment even though you feel ill unless your healthcare professional tells you to stop. This medicine can make you more sensitive to cold. Do not drink cold drinks or use ice. Cover exposed skin before coming in contact with cold temperatures or cold objects. When out in cold weather wear warm clothing and cover your mouth and nose to warm the air that goes into your lungs. Tell your doctor if you get sensitive to the cold. Do not become pregnant while taking this medicine or for 9 months after stopping it. Women should inform their health care professional if they wish to become pregnant or think they might be pregnant. Men should not father a child while taking this medicine and for 6 months after stopping it. There is potential for serious side effects to an unborn child. Talk to your health care professional for more information. Do not  breast-feed a child while taking this medicine or for 3 months after stopping it. This medicine has caused ovarian failure in some women. This medicine may make it more difficult to get pregnant. Talk to your health care professional if you are concerned about your fertility. This medicine has caused decreased sperm counts in some men. This may make it more difficult to father a child. Talk to your health care professional if you are concerned about your fertility. This medicine may increase your risk of getting an infection. Call your health care professional for advice if you get a fever, chills, or sore throat, or other symptoms of a cold or flu. Do not treat yourself. Try to avoid being around people who are sick. Avoid taking medicines that contain aspirin, acetaminophen, ibuprofen, naproxen, or ketoprofen unless instructed by  your health care professional. These medicines may hide a fever. Be careful brushing or flossing your teeth or using a toothpick because you may get an infection or bleed more easily. If you have any dental work done, tell your dentist you are receiving this medicine. What side effects may I notice from receiving this medicine? Side effects that you should report to your doctor or health care professional as soon as possible:  allergic reactions like skin rash, itching or hives, swelling of the face, lips, or tongue  breathing problems  cough  low blood counts - this medicine may decrease the number of white blood cells, red blood cells, and platelets. You may be at increased risk for infections and bleeding  nausea, vomiting  pain, redness, or irritation at site where injected  pain, tingling, numbness in the hands or feet  signs and symptoms of bleeding such as bloody or black, tarry stools; red or dark brown urine; spitting up blood or brown material that looks like coffee grounds; red spots on the skin; unusual bruising or bleeding from the eyes, gums, or  nose  signs and symptoms of a dangerous change in heartbeat or heart rhythm like chest pain; dizziness; fast, irregular heartbeat; palpitations; feeling faint or lightheaded; falls  signs and symptoms of infection like fever; chills; cough; sore throat; pain or trouble passing urine  signs and symptoms of liver injury like dark yellow or brown urine; general ill feeling or flu-like symptoms; light-colored stools; loss of appetite; nausea; right upper belly pain; unusually weak or tired; yellowing of the eyes or skin  signs and symptoms of low red blood cells or anemia such as unusually weak or tired; feeling faint or lightheaded; falls  signs and symptoms of muscle injury like dark urine; trouble passing urine or change in the amount of urine; unusually weak or tired; muscle pain; back pain Side effects that usually do not require medical attention (report to your doctor or health care professional if they continue or are bothersome):  changes in taste  diarrhea  gas  hair loss  loss of appetite  mouth sores This list may not describe all possible side effects. Call your doctor for medical advice about side effects. You may report side effects to FDA at 1-800-FDA-1088. Where should I keep my medicine? This drug is given in a hospital or clinic and will not be stored at home. NOTE: This sheet is a summary. It may not cover all possible information. If you have questions about this medicine, talk to your doctor, pharmacist, or health care provider.  2020 [employer] Standard (2018-09-18 12:20:35)  

## 2019-08-28 LAB — CEA: CEA: 6.1 ng/mL — ABNORMAL HIGH (ref 0.0–4.7)

## 2019-08-29 ENCOUNTER — Other Ambulatory Visit: Payer: Self-pay

## 2019-08-29 ENCOUNTER — Other Ambulatory Visit: Payer: Self-pay | Admitting: Hematology and Oncology

## 2019-08-29 ENCOUNTER — Inpatient Hospital Stay: Payer: Medicare Other

## 2019-08-29 VITALS — BP 175/76 | HR 83 | Temp 98.3°F | Resp 19

## 2019-08-29 DIAGNOSIS — C184 Malignant neoplasm of transverse colon: Secondary | ICD-10-CM

## 2019-08-29 DIAGNOSIS — Z5111 Encounter for antineoplastic chemotherapy: Secondary | ICD-10-CM | POA: Diagnosis not present

## 2019-08-29 DIAGNOSIS — Z85038 Personal history of other malignant neoplasm of large intestine: Secondary | ICD-10-CM

## 2019-08-29 DIAGNOSIS — C786 Secondary malignant neoplasm of retroperitoneum and peritoneum: Secondary | ICD-10-CM

## 2019-08-29 LAB — MAGNESIUM: Magnesium: 1.8 mg/dL (ref 1.7–2.4)

## 2019-08-29 MED ORDER — SODIUM CHLORIDE 0.9% FLUSH
10.0000 mL | INTRAVENOUS | Status: DC | PRN
  Administered 2019-08-29: 10 mL
  Filled 2019-08-29: qty 10

## 2019-08-29 MED ORDER — HEPARIN SOD (PORK) LOCK FLUSH 100 UNIT/ML IV SOLN
500.0000 [IU] | Freq: Once | INTRAVENOUS | Status: AC | PRN
  Administered 2019-08-29: 500 [IU]
  Filled 2019-08-29: qty 5

## 2019-08-29 MED ORDER — PEGFILGRASTIM INJECTION 6 MG/0.6ML ~~LOC~~
6.0000 mg | PREFILLED_SYRINGE | Freq: Once | SUBCUTANEOUS | Status: AC
  Administered 2019-08-29: 14:00:00 6 mg via SUBCUTANEOUS
  Filled 2019-08-29: qty 0.6

## 2019-08-29 NOTE — Patient Instructions (Signed)
Patient instructed to begin claritin today and to take 1 daily for 1 week to help reduce side effects of neulasta. Patient states he does have plenty of claritin at home.

## 2019-09-03 ENCOUNTER — Other Ambulatory Visit: Payer: Self-pay

## 2019-09-03 ENCOUNTER — Inpatient Hospital Stay: Payer: Medicare Other

## 2019-09-03 DIAGNOSIS — Z5111 Encounter for antineoplastic chemotherapy: Secondary | ICD-10-CM | POA: Diagnosis not present

## 2019-09-03 DIAGNOSIS — C184 Malignant neoplasm of transverse colon: Secondary | ICD-10-CM

## 2019-09-03 LAB — CBC WITH DIFFERENTIAL/PLATELET
Abs Immature Granulocytes: 0.07 10*3/uL (ref 0.00–0.07)
Basophils Absolute: 0.1 10*3/uL (ref 0.0–0.1)
Basophils Relative: 1 %
Eosinophils Absolute: 0.2 10*3/uL (ref 0.0–0.5)
Eosinophils Relative: 1 %
HCT: 36.6 % — ABNORMAL LOW (ref 39.0–52.0)
Hemoglobin: 12.1 g/dL — ABNORMAL LOW (ref 13.0–17.0)
Immature Granulocytes: 1 %
Lymphocytes Relative: 18 %
Lymphs Abs: 2.7 10*3/uL (ref 0.7–4.0)
MCH: 31.4 pg (ref 26.0–34.0)
MCHC: 33.1 g/dL (ref 30.0–36.0)
MCV: 95.1 fL (ref 80.0–100.0)
Monocytes Absolute: 1.3 10*3/uL — ABNORMAL HIGH (ref 0.1–1.0)
Monocytes Relative: 9 %
Neutro Abs: 10.8 10*3/uL — ABNORMAL HIGH (ref 1.7–7.7)
Neutrophils Relative %: 70 %
Platelets: 163 10*3/uL (ref 150–400)
RBC: 3.85 MIL/uL — ABNORMAL LOW (ref 4.22–5.81)
RDW: 14.9 % (ref 11.5–15.5)
WBC: 15.2 10*3/uL — ABNORMAL HIGH (ref 4.0–10.5)
nRBC: 0 % (ref 0.0–0.2)

## 2019-09-03 NOTE — Progress Notes (Signed)

## 2019-09-05 ENCOUNTER — Telehealth: Payer: Self-pay | Admitting: Hematology and Oncology

## 2019-09-05 NOTE — Telephone Encounter (Signed)
Carrick's wife called and said he is having another flair up with his foot. He is going to Dr Grayland Ormond this afternoon at [facility]. She is at home if you need to talk to her. She said it's the same senario as last time. It happened after his injection.

## 2019-09-08 NOTE — Progress Notes (Signed)
[facility]  [address] [place], [place] [address] Phone: [phone]  Fax: [phone]   Clinic Day:  09/10/2019  Referring physician: Tracie Harrier, MD  Chief Complaint: Jared Gutierrez is a 79 y.o. male with metastaticcolon cancer who is seen for assessment prior to cycle #6 FOLFOX chemotherapy.    HPI: The patient was last seen in the medical oncology clinic on 08/27/2019. At that time, he was doing well. He was back to baseline. Exam was unremarkable. Hematocrit was 35.8, hemoglobin 11.9, MCV 95.0, platelets 177,000, WBC 8800, ANC 5700. Magnesium was 1.5. CEA was 6.1.   Patient received cycle #5 FOLFOX chemotherapy on 08/27/2019 with Neulasta support on 08/29/2019.   Labs on 09/03/2019: Hematocrit 36.6, hemoglobin 12.1, MCV 95.1, platelets 163,000, WBC 15,200 with an ANC 10,800.   He was seen on 09/05/2019 by Grayland Ormond, PA with a left swollen foot associated with erythema and pain.  He had bilateral lower extremity edema.  He began Lasix and Keflex 3 times a day for 7 days.  During the interim, he says things are going rough.On 09/01/2019, his feet began to swell. He has been unable to get around because of his swollen feet. His wife says he began complaining of pain in his feet on 09/05/2019. He has been unable to walk secondary to pain.  He has not had any fevers. His feet have been red. He says the Keflex has not really been helping. The pain radiates more in the left foot than his right foot. The pain is associated with the redness. He denies any nausea, vomiting, and diarrhea. 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 Neuropathy is worse if he holds something cold. He denies any neuropathy in his toes.  He notes pain in his right elbow.  He felt slightly dizzy as he got up this morning. He says he feels "funny" in his head. He denies any chest pain or shortness of breath. He denies any orthopenea or paroxysmal nocturnal  dyspnea (PND).  He is scheduled him for an echo on 09/19/2019. His BP was 90/50 with a pulse of 103 while sitting and a BP of 81/34 and a pulse of 46 when standing.  During his exam, pulse is 124 with episodic pauses. While awaiting the ambulance, he became diaphoretic.  He denied chest pain.   Past Medical History:  Diagnosis Date  . Arthritis    OSTEOARTHRITIS  . Cancer ([place])   . Cavitary lesion of lung    RIGHT LOWER LOBE  . Chicken pox   . Colon cancer ([place])   . History of kidney stones   . Hypertension   . Lipoma of colon   . Nephrolithiasis   . Nephrolithiasis   . Obesity   . Shingles   . Tubular adenoma of colon    multiple fragments    Past Surgical History:  Procedure Laterality Date  . COLON SURGERY    . COLONOSCOPY N/A 10/02/2014   Procedure: COLONOSCOPY;  Surgeon: Josefine Class, MD;  Location: [facility] ENDOSCOPY;  Service: Endoscopy;  Laterality: N/A;  . COLONOSCOPY WITH PROPOFOL N/A 02/16/2017   Procedure: COLONOSCOPY WITH PROPOFOL;  Surgeon: Manya Silvas, MD;  Location: [facility] ENDOSCOPY;  Service: Endoscopy;  Laterality: N/A;  . EUS N/A 04/17/2019   Procedure: FULL UPPER ENDOSCOPIC ULTRASOUND (EUS) RADIAL;  Surgeon: Holly Bodily, MD;  Location: [facility] ENDOSCOPY;  Service: Gastroenterology;  Laterality: N/A;  . KIDNEY STONE SURGERY    .  PARTIAL COLECTOMY  10/17/2013  . PORTACATH PLACEMENT Right 06/13/2019   Procedure: INSERTION PORT-A-CATH;  Surgeon: Benjamine Sprague, DO;  Location: [facility] ORS;  Service: General;  Laterality: Right;    Family History  Problem Relation Age of Onset  . Cancer Mother   . Breast cancer Mother   . COPD Father     Social History:  reports that he has never smoked. He has never used smokeless tobacco. He reports current alcohol use. He reports that he does not use drugs. 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He is married and his wife's name is [name] [phone]).  The patient is accompanied by his  wife on the iPad today.  Allergies: No Known Allergies  Current Medications: Current Outpatient Medications  Medication Sig Dispense Refill  . amLODipine (NORVASC) 2.5 MG tablet Take 2.5 mg by mouth daily.     [name] aspirin EC 81 MG tablet Take 81 mg by mouth daily.    . cephALEXin (KEFLEX) 500 MG capsule Take by mouth.    . Cholecalciferol (VITAMIN D) 125 MCG (5000 UT) CAPS Take 5,000 mg by mouth.    . docusate sodium ([employer] STOOL SOFTENER) 100 MG capsule Take 100 mg by mouth 2 (two) times daily.    [name] HYDROcodone-acetaminophen (NORCO/VICODIN) 5-325 MG tablet Take 1 tablet by mouth every 6 (six) hours as needed for moderate pain (up to 3 doses for moderate pain.).     [name] ibuprofen (ADVIL) 800 MG tablet Take 800 mg by mouth every 8 (eight) hours as needed.    . lidocaine-prilocaine (EMLA) cream Apply to affected area once 30 g 3  . olmesartan (BENICAR) 20 MG tablet Take 20 mg by mouth daily.    . potassium chloride SA (KLOR-CON M20) 20 MEQ tablet Take by mouth.    . Probiotic Product (PROBIOTIC DAILY PO) Take 1 tablet by mouth daily.    [name] loratadine (CLARITIN) 10 MG tablet Take 10 mg by mouth daily.    . ondansetron (ZOFRAN) 8 MG tablet Take 1 tablet (8 mg total) by mouth 2 (two) times daily as needed for refractory nausea / vomiting. Start on day 3 after chemotherapy. (Patient not taking: Reported on 08/27/2019) 30 tablet 1   No current facility-administered medications for this visit.   Facility-Administered Medications Ordered in Other Visits  Medication Dose Route Frequency Provider Last Rate Last Admin  . heparin lock flush 100 unit/mL  500 Units Intravenous Once Kolin Erdahl C, MD      . sodium chloride flush (NS) 0.9 % injection 10 mL  10 mL Intravenous PRN Lequita Asal, MD        Review of Systems  Constitutional: Negative for chills, diaphoresis, fever, malaise/fatigue and weight loss (no new weight).       Feels "rough".  HENT: Negative.  Negative for congestion, ear  pain, nosebleeds, sore throat and tinnitus.   Eyes: Negative.  Negative for blurred vision, double vision and photophobia.  Respiratory: Negative.  Negative for cough, sputum production, shortness of breath and wheezing.   Cardiovascular: Positive for leg swelling (little swelling on top of left foot). Negative for chest pain, palpitations, orthopnea and PND.  Gastrointestinal: Negative.  Negative for abdominal pain, blood in stool, constipation, diarrhea, heartburn, melena, nausea (well managed with ondansetron) and vomiting.  Genitourinary: Negative.  Negative for dysuria, frequency, hematuria and urgency.  Musculoskeletal: Positive for joint pain (chronic bilateral knee pain). Negative for back pain, falls and myalgias.  Ambulates well.  Skin: Negative for itching and rash.       Redness in left foot (great toe).  Neurological: Positive for dizziness (slight; this morning) and sensory change (neuropathy in finger tips). Negative for speech change, focal weakness, weakness and headaches.  Endo/Heme/Allergies: Does not bruise/bleed easily.  Psychiatric/Behavioral: Negative for depression and memory loss. The patient is not nervous/anxious and does not have insomnia.   All other systems reviewed and are negative.  Performance status (ECOG): 2  Vitals Blood pressure (!) 90/50, pulse (!) 103, temperature (!) 96.7 F (35.9 C), temperature source Tympanic, resp. rate 18, SpO2 100 %.   Physical Exam  Constitutional: He is oriented to person, place, and time. He appears well-developed and well-nourished. No distress. Face mask in place.  Patient sitting in a wheelchair in no acute distress.  He has a cane at his side.  HENT:  Head: Normocephalic and atraumatic.  Mouth/Throat: Oropharynx is clear and moist and mucous membranes are normal. No oral lesions.  Short gray hair and beard.  Mask.  Eyes: Pupils are equal, round, and reactive to light. Conjunctivae and EOM are normal. Right eye  exhibits no discharge. Left eye exhibits no discharge. No scleral icterus.  Neck: No JVD present.  Cardiovascular: Intact distal pulses. Exam reveals no gallop and no friction rub.  No murmur heard. Tachycardia and bradycardia.  During episodes of tachycardia, periodic pauses.  Pulmonary/Chest: Effort normal and breath sounds normal. No respiratory distress. He has no wheezes. He has no rhonchi. He has no rales. He exhibits no tenderness.  Abdominal: Soft. Normal appearance and bowel sounds are normal. He exhibits no distension and no mass. There is no hepatosplenomegaly. There is no abdominal tenderness. There is no rebound, no guarding and no CVA tenderness.  Musculoskeletal:        General: Edema (trace-1+ left freat toe and dorsum of foot.  Non-painful to palpation.) present. No tenderness or deformity. Normal range of motion.     Cervical back: Normal range of motion and neck supple.  Lymphadenopathy:       Head (right side): No preauricular, no posterior auricular and no occipital adenopathy present.       Head (left side): No preauricular, no posterior auricular and no occipital adenopathy present.    He has no cervical adenopathy.    He has no axillary adenopathy.       Right: No inguinal and no supraclavicular adenopathy present.       Left: No inguinal and no supraclavicular adenopathy present.  Neurological: He is alert and oriented to person, place, and time. No sensory deficit.  Skin: Skin is warm, dry and intact. No bruising, no lesion and no rash noted. He is not diaphoretic. No erythema. No pallor.  Psychiatric: He has a normal mood and affect. His behavior is normal. Judgment and thought content normal.  Nursing note and vitals reviewed.          Infusion on 09/10/2019  Component Date Value Ref Range Status  . Sodium 09/10/2019 136  135 - 145 mmol/L Final  . Potassium 09/10/2019 4.1  3.5 - 5.1 mmol/L Final  . Chloride 09/10/2019 101  98 - 111 mmol/L Final  . CO2  09/10/2019 25  22 - 32 mmol/L Final  . Glucose, Bld 09/10/2019 227* 70 - 99 mg/dL Final   Glucose reference range applies only to samples taken after fasting for at least 8 hours.  . BUN 09/10/2019 13  8 - 23 mg/dL Final  .  Creatinine, Ser 09/10/2019 1.02  0.61 - 1.24 mg/dL Final  . Calcium 09/10/2019 9.5  8.9 - 10.3 mg/dL Final  . Total Protein 09/10/2019 7.9  6.5 - 8.1 g/dL Final  . Albumin 09/10/2019 3.6  3.5 - 5.0 g/dL Final  . AST 09/10/2019 44* 15 - 41 U/L Final  . ALT 09/10/2019 33  0 - 44 U/L Final  . Alkaline Phosphatase 09/10/2019 83  38 - 126 U/L Final  . Total Bilirubin 09/10/2019 0.7  0.3 - 1.2 mg/dL Final  . GFR calc non Af Amer 09/10/2019 >60  >60 mL/min Final  . GFR calc Af Amer 09/10/2019 >60  >60 mL/min Final  . Anion gap 09/10/2019 10  5 - 15 Final   Performed at [facility], [address] [place], [place] [address]  . WBC 09/10/2019 9.3  4.0 - 10.5 K/uL Final  . RBC 09/10/2019 4.06* 4.22 - 5.81 MIL/uL Final  . Hemoglobin 09/10/2019 12.6* 13.0 - 17.0 g/dL Final  . HCT 09/10/2019 38.5* 39.0 - 52.0 % Final  . MCV 09/10/2019 94.8  80.0 - 100.0 fL Final  . MCH 09/10/2019 31.0  26.0 - 34.0 pg Final  . MCHC 09/10/2019 32.7  30.0 - 36.0 g/dL Final  . RDW 09/10/2019 14.8  11.5 - 15.5 % Final  . Platelets 09/10/2019 218  150 - 400 K/uL Final  . nRBC 09/10/2019 0.2  0.0 - 0.2 % Final  . Neutrophils Relative % 09/10/2019 67  % Final  . Neutro Abs 09/10/2019 6.3  1.7 - 7.7 K/uL Final  . Lymphocytes Relative 09/10/2019 23  % Final  . Lymphs Abs 09/10/2019 2.2  0.7 - 4.0 K/uL Final  . Monocytes Relative 09/10/2019 5  % Final  . Monocytes Absolute 09/10/2019 0.5  0.1 - 1.0 K/uL Final  . Eosinophils Relative 09/10/2019 1  % Final  . Eosinophils Absolute 09/10/2019 0.1  0.0 - 0.5 K/uL Final  . Basophils Relative 09/10/2019 1  % Final  . Basophils Absolute 09/10/2019 0.1  0.0 - 0.1 K/uL Final  . Immature Granulocytes 09/10/2019 3  % Final  . Abs Immature  Granulocytes 09/10/2019 0.25* 0.00 - 0.07 K/uL Final   Performed at [facility], [address], [place], [place] [address]  . Magnesium 09/10/2019 1.7  1.7 - 2.4 mg/dL Final   Performed at [facility], [address], [place],  [address]    Assessment:  Jared Gutierrez is a 79 y.o. male  withmetastatic colon cancer. He was diagnosed with stage I colon cancers/p transverse colectomy on 10/17/2013. 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 Tumor extended into the submucosa. Margins were negative. There was lymphovascular invasion. 14 lymph nodes were negative.Pathologic stagewas T1 N0.  Colonoscopyon 10/02/2014 noted several 3 mm polyps and an 8 mm polyp in the cecum and transverse colon. Pathology revealed tubular adenomas negative for high-grade dysplasia and malignancy. Colonoscopyon 02/16/2017 revealed 2 diminutive polyps in the descending colon. Pathology revealed tubular adenomas without dysplasia or malignancy.  CEAhas been followed: 3.1 on 04/27/2014, 2.3 on 11/11/2014, 3.0 on 05/12/2015, 2.7 on 11/10/2015, 3.2 on 05/10/2016, 2.8 on 11/01/2016, 3.3 on 04/25/2017, 3.1 on 10/31/2017, 4.0 on 05/01/2018, 7.0on 11/28/2018, 7.4 on 12/13/2018, 10.5 on 03/25/2019, 17.8on 06/25/2019, 16.2 on 07/09/2019, 13.8 on 07/23/2019, 11.9 on 07/30/2019, and 6.1 on 08/27/2019.  Chest, abdomen and pelvis CTon 12/20/2018 revealed postoperative findings of transverse colon resection without evidence  of recurrent mass, definite lymphadenopathy, or distant metastatic disease to explain rising CEA. Therewereprominent sub-centimeter retroperitoneal and iliac lymph nodes, uncertain significance. There were postoperative findings about the left renal hilum, of uncertain nature, with a broad-based postoperative fat containing left-sided lumbar hernia.There was  bibasilar bronchiectasis and scarring with multiple post infectious or inflammatory pneumatoceles, unchanged in comparison to CT date 06/23/2011.  PET scanon 04/02/2019 revealed a2.4 x 2.3 cm (SUV 11)hypermetabolic soft tissue density caudal and anterior to the pancreatic neckfavored to represent isolated peritoneal or nodal metastasis in the setting of prior transverse colonic resection (expected primary drainage). Although this was immediately adjacent to the pancreas, a fat plane was maintained, arguing strongly against a pancreatic primary. Otherwise,there wasno evidence of hypermetabolic metastasis.  Upper endoscopic ultrasoundon 04/17/2019 revealeda normal esophagus, stomach, duodenum, and pancreas.There was a 2.4 x 2.4 cm irregularmassin the retroperitoneum adjacent to, but not involving the pancreatic neck. FNA and core needle biopsy were performed. Pathologyrevealed adenocarcinoma compatible with a metastatic lesion of colorectal origin. Tumor cells were positive for CK20 and CDX2andnegative for CK7.  Omniseqon 05/15/2019 revealed + KRAS and TP53. Negative results included BRAF V600E, Her2, NRAS, NTRK, PD-L1 (<1%), and TMB 8.7/Mb (intermediate). MMR testingfrom his colon resection on 10/16/2013 was intact with a low probability of MSI-H.  He is s/p5cycles of FOLFOXchemotherapy (07/09/2019- 08/27/2019).  He received Neulasta after cycle #4 secondary to progressive leukopenia.  He has chronic right lower extremity edema. Right lower extremity duplex on 12/24/2018 revealed no DVT. He has a small right Baker's cyst.  Bilateral lower extremity duplex on 08/16/2019 revealed no evidence of deep venous thrombosis in either lower extremity. Increased pulsatility of the venous waveforms bilaterally. These findings suggested elevated right heart pressures. Differential considerations included tricuspid regurgitation, right heart failure, pulmonary arterial hypertension and  chronic COPD.  He was admitted to [facility] from 08/16/2019 - 08/17/2019 with right lower extremity cellulitis.  He was unable to bear weight.  He was treated with IV fluids, NSAIDs, colchicine, and broad antibiotics (vancomycin and Cefepime).  He was discharged on indomethacin x 5 days and Keflex 500 mg TID x 5 days.  He has received his COVID-19 vaccine.  Symptomatically, he has had some dizziness today.  He denies any chest pain, shortness of breath or nausea.  He has left > right foot pain.  Exam revelas episodes of tachycardia and bradycardia as well as pauses.  Plan: 1.   Labs today:  CBC with diff, CMP, Mg, CEA. 2.   Metastaticcolon cancer Clinically, he appears to be doing well, He initially presented with stage I disease s/p resection. PET scan on 04/02/2019 revealeda2.4 x 2.3 cm (SUV 11)soft tissue density caudal and anterior to the pancreatic neck. 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 Lesion was not resectable. NGS revealed no targetable mutation. MMR on original resection revealed a a low probability of MSI-H. He declined Avastin. He is s/p 5cycles of FOLFOX chemotherapy. He received Neulasta with cycle #4 and #5.  CEA has decreased from 17.8 to 6.1.   CEA today is pending.  Concern raised today for possible neuropathy causing pain in his feet.   Will address continuation of oxaliplatin at next visit. Discuss symptom management.  He has antiemetics at home to use on a prn bases.  Interventions are adequate.   .  3.   Bilateral foot pain  Patient previously admitted for right foot cellulitis, received a course of antibiotics, and treated for possible gout.  Recently he  was treated again for possible cellulitis of the left great toe.  Exam reveals no erythema, but only mild edema  in the left foot possibly related to gout.  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 4.   Tachycardia and bradycardia  Patient with alternating tachycardia and bradycardia in clinic.  Exam reveals long pauses during tachycardia.  Patient symptomatic with lightheadedness and later diaphoresis.  Decision made to transport to ER. 5.   Contact ambulance to transport patient to [facility] ER- report called. 6.   RTC in 1 week for MD assessment, labs (CBC with diff, CMP, Mg), and cycle #5 FOLFOX +/- oxlaiplatin.   I discussed the assessment and treatment plan with the patient.  The patient was provided an opportunity to ask questions and all were answered.  The patient agreed with the plan and demonstrated an understanding of the instructions.  The patient was advised to call back if the symptoms worsen or if the condition fails to improve as anticipated.  I provided 23 minutes (9:45 AM - 10:08 AM) of face-to-face time during this this encounter and > 50% was spent counseling as documented under my assessment and plan.  An additional 10 minutes were spent after his in-person evaluation coordinating care.   Lequita Asal, MD, PhD    09/10/2019, 9:45 AM  I, Heywood Footman, am acting as a Education administrator for Lequita Asal, MD.  I, [place] Mike Gip, MD, have reviewed the above documentation for accuracy and completeness, and I agree with the above.

## 2019-09-09 ENCOUNTER — Other Ambulatory Visit: Payer: Self-pay

## 2019-09-09 NOTE — Progress Notes (Signed)
Patient reports pain, swelling, and redness in his left foot again. This is the same thing that happened about 2 weeks ago. He was put on keflex for it. He also reports cold sensitivity in his hands. He follows up with Dr Venetia Maxon on the 30th of April

## 2019-09-10 ENCOUNTER — Inpatient Hospital Stay (HOSPITAL_BASED_OUTPATIENT_CLINIC_OR_DEPARTMENT_OTHER): Payer: Medicare Other | Admitting: Hematology and Oncology

## 2019-09-10 ENCOUNTER — Inpatient Hospital Stay: Payer: Medicare Other

## 2019-09-10 ENCOUNTER — Emergency Department: Payer: Medicare Other

## 2019-09-10 ENCOUNTER — Telehealth: Payer: Self-pay

## 2019-09-10 ENCOUNTER — Emergency Department
Admission: EM | Admit: 2019-09-10 | Discharge: 2019-09-10 | Disposition: A | Discharge status: Home / Self Care | Payer: Medicare Other | Attending: Emergency Medicine | Admitting: Emergency Medicine

## 2019-09-10 ENCOUNTER — Other Ambulatory Visit: Payer: Self-pay

## 2019-09-10 ENCOUNTER — Encounter: Payer: Self-pay | Admitting: Hematology and Oncology

## 2019-09-10 VITALS — BP 90/50 | HR 103 | Temp 96.7°F | Resp 18

## 2019-09-10 DIAGNOSIS — I959 Hypotension, unspecified: Secondary | ICD-10-CM | POA: Diagnosis not present

## 2019-09-10 DIAGNOSIS — Z79899 Other long term (current) drug therapy: Secondary | ICD-10-CM | POA: Diagnosis not present

## 2019-09-10 DIAGNOSIS — Z9221 Personal history of antineoplastic chemotherapy: Secondary | ICD-10-CM | POA: Insufficient documentation

## 2019-09-10 DIAGNOSIS — Z7982 Long term (current) use of aspirin: Secondary | ICD-10-CM | POA: Diagnosis not present

## 2019-09-10 DIAGNOSIS — R600 Localized edema: Secondary | ICD-10-CM | POA: Insufficient documentation

## 2019-09-10 DIAGNOSIS — C786 Secondary malignant neoplasm of retroperitoneum and peritoneum: Secondary | ICD-10-CM

## 2019-09-10 DIAGNOSIS — M7989 Other specified soft tissue disorders: Secondary | ICD-10-CM

## 2019-09-10 DIAGNOSIS — Z85038 Personal history of other malignant neoplasm of large intestine: Secondary | ICD-10-CM | POA: Diagnosis not present

## 2019-09-10 DIAGNOSIS — M79671 Pain in right foot: Secondary | ICD-10-CM

## 2019-09-10 DIAGNOSIS — C184 Malignant neoplasm of transverse colon: Secondary | ICD-10-CM

## 2019-09-10 DIAGNOSIS — R2243 Localized swelling, mass and lump, lower limb, bilateral: Secondary | ICD-10-CM | POA: Diagnosis not present

## 2019-09-10 DIAGNOSIS — M79672 Pain in left foot: Secondary | ICD-10-CM

## 2019-09-10 DIAGNOSIS — I10 Essential (primary) hypertension: Secondary | ICD-10-CM | POA: Insufficient documentation

## 2019-09-10 DIAGNOSIS — R000 Tachycardia, unspecified: Secondary | ICD-10-CM

## 2019-09-10 LAB — CBC WITH DIFFERENTIAL/PLATELET
Abs Immature Granulocytes: 0.25 10*3/uL — ABNORMAL HIGH (ref 0.00–0.07)
Basophils Absolute: 0.1 10*3/uL (ref 0.0–0.1)
Basophils Relative: 1 %
Eosinophils Absolute: 0.1 10*3/uL (ref 0.0–0.5)
Eosinophils Relative: 1 %
HCT: 38.5 % — ABNORMAL LOW (ref 39.0–52.0)
Hemoglobin: 12.6 g/dL — ABNORMAL LOW (ref 13.0–17.0)
Immature Granulocytes: 3 %
Lymphocytes Relative: 23 %
Lymphs Abs: 2.2 10*3/uL (ref 0.7–4.0)
MCH: 31 pg (ref 26.0–34.0)
MCHC: 32.7 g/dL (ref 30.0–36.0)
MCV: 94.8 fL (ref 80.0–100.0)
Monocytes Absolute: 0.5 10*3/uL (ref 0.1–1.0)
Monocytes Relative: 5 %
Neutro Abs: 6.3 10*3/uL (ref 1.7–7.7)
Neutrophils Relative %: 67 %
Platelets: 218 10*3/uL (ref 150–400)
RBC: 4.06 MIL/uL — ABNORMAL LOW (ref 4.22–5.81)
RDW: 14.8 % (ref 11.5–15.5)
WBC: 9.3 10*3/uL (ref 4.0–10.5)
nRBC: 0.2 % (ref 0.0–0.2)

## 2019-09-10 LAB — COMPREHENSIVE METABOLIC PANEL
ALT: 33 U/L (ref 0–44)
AST: 44 U/L — ABNORMAL HIGH (ref 15–41)
Albumin: 3.6 g/dL (ref 3.5–5.0)
Alkaline Phosphatase: 83 U/L (ref 38–126)
Anion gap: 10 (ref 5–15)
BUN: 13 mg/dL (ref 8–23)
CO2: 25 mmol/L (ref 22–32)
Calcium: 9.5 mg/dL (ref 8.9–10.3)
Chloride: 101 mmol/L (ref 98–111)
Creatinine, Ser: 1.02 mg/dL (ref 0.61–1.24)
GFR calc Af Amer: >60 mL/min (ref >60)
GFR calc non Af Amer: >60 mL/min (ref >60)
Glucose, Bld: 227 mg/dL — ABNORMAL HIGH (ref 70–99)
Potassium: 4.1 mmol/L (ref 3.5–5.1)
Sodium: 136 mmol/L (ref 135–145)
Total Bilirubin: 0.7 mg/dL (ref 0.3–1.2)
Total Protein: 7.9 g/dL (ref 6.5–8.1)

## 2019-09-10 LAB — URINALYSIS, COMPLETE (UACMP) WITH MICROSCOPIC
Bacteria, UA: NONE SEEN
Bilirubin Urine: NEGATIVE
Glucose, UA: NEGATIVE mg/dL
Hgb urine dipstick: NEGATIVE
Ketones, ur: NEGATIVE mg/dL
Nitrite: NEGATIVE
Protein, ur: 100 mg/dL — AB
Specific Gravity, Urine: 1.024 (ref 1.005–1.030)
pH: 5 (ref 5.0–8.0)

## 2019-09-10 LAB — TROPONIN I (HIGH SENSITIVITY): Troponin I (High Sensitivity): 8 ng/L (ref <18)

## 2019-09-10 LAB — MAGNESIUM: Magnesium: 1.7 mg/dL (ref 1.7–2.4)

## 2019-09-10 LAB — BRAIN NATRIURETIC PEPTIDE: B Natriuretic Peptide: 44 pg/mL (ref 0.0–100.0)

## 2019-09-10 MED ORDER — SODIUM CHLORIDE 0.9 % IV BOLUS
500.0000 mL | Freq: Once | INTRAVENOUS | Status: AC
  Administered 2019-09-10: 13:00:00 500 mL via INTRAVENOUS

## 2019-09-10 MED ORDER — HEPARIN SOD (PORK) LOCK FLUSH 100 UNIT/ML IV SOLN
500.0000 [IU] | Freq: Once | INTRAVENOUS | Status: AC
  Administered 2019-09-10: 18:00:00 500 [IU] via INTRAVENOUS

## 2019-09-10 MED ORDER — HEPARIN SOD (PORK) LOCK FLUSH 100 UNIT/ML IV SOLN
500.0000 [IU] | Freq: Once | INTRAVENOUS | Status: DC
  Filled 2019-09-10: qty 5

## 2019-09-10 MED ORDER — HEPARIN SOD (PORK) LOCK FLUSH 100 UNIT/ML IV SOLN
INTRAVENOUS | Status: AC
  Filled 2019-09-10: qty 5

## 2019-09-10 MED ORDER — SODIUM CHLORIDE 0.9% FLUSH
10.0000 mL | INTRAVENOUS | Status: DC | PRN
  Filled 2019-09-10: qty 10

## 2019-09-10 NOTE — Discharge Instructions (Addendum)
As we discussed, your leg swelling seems to be related to your cancer regimen, most possibly your booster shots.  This could be certainly contributing to leg swelling as well as possible flareups of gout.  For now, given your low blood pressure here, I recommend the following:  Hold/stop taking the amlodipine 2.5 mg until you see Dr. Ginette Pitman on Friday  Take the ibuprofen 400 mg (2 over-the-counter pills) every 8 hours as needed for pain  Discuss your swelling with Dr. Mike Gip as well

## 2019-09-10 NOTE — ED Notes (Signed)
Pt assisted to bedside to sit on edge of bed to urinate. Urine sample collected at this time

## 2019-09-10 NOTE — Telephone Encounter (Signed)
Called Dr Linton Ham office to notify of patients cardiac issues. In the mean time, patient started feeling worse, lightheaded and dizzy. Spoke with Dr Mike Gip and decided to call ambulance so that he can be taken to the ED. Ambulance called and Dr Mike Gip gave report to ED triage nurse. Wife with patient

## 2019-09-10 NOTE — ED Notes (Addendum)
Wife at bedside. Pt states that he is here due to a hypotensive episode this morning. Wife states it was "90." Pt states that he had feet swelling. PT states he had the swelling over easter and it got better but is back. Pt states currently has colon cancer. Wife states other than the feet swelling, pt is at his baseline.

## 2019-09-10 NOTE — ED Notes (Signed)
Verified with provider that repeat troponin is not needed at this time.

## 2019-09-10 NOTE — ED Provider Notes (Signed)
[facility] Emergency Department Provider Note  ____________________________________________   First MD Initiated Contact with Patient 09/10/19 1206     (approximate)  I have reviewed the triage vital signs and the nursing notes.  History  Chief Complaint Hypotension    HPI WHIT TONES is a 79 y.o. male past medical history as below, notable for colon cancer currently undergoing chemotherapy, who presents from the clinic for low blood pressure.  Patient presented to clinic for his scheduled infusion when he was noted to be hypotensive on intake vitals, [id number] systolic, [id number] diastolic.  He denies feeling symptomatic from this perspective, denies any chest pain, lightheadedness, dizziness, syncope.  Only complaint is some intermittent, bilateral foot/lower leg swelling, which has been ongoing since Easter.  Was evaluated for this, including negative ultrasound of the bilateral lower extremities.  Intermittent swelling currently thought to be related to his infusions, potential side effect.  Denies any history of VTE.  No known history of heart failure.  On chart review, does have amlodipine listed as medication.  States he is eating and drinking well, no decreased by mouth intake.  No vomiting or diarrhea.  Reports the swelling does cause a mild amount of discomfort, describes as aching due to the swelling, mild in severity, located to the dorsum of the bilateral feet.  No radiation.  Improved with relaxation and foot elevation.   Past Medical Hx Past Medical History:  Diagnosis Date  . Arthritis    OSTEOARTHRITIS  . Cancer ([place])   . Cavitary lesion of lung    RIGHT LOWER LOBE  . Chicken pox   . Colon cancer ([place])   . History of kidney stones   . Hypertension   . Lipoma of colon   . Nephrolithiasis   . Nephrolithiasis   . Obesity   . Shingles   . Tubular adenoma of colon    multiple fragments    Problem List Patient Active Problem List   Diagnosis Date Noted  . 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 Sepsis ([place]) 08/16/2019  . Cellulitis of right lower extremity 08/16/2019  . Maintenance chemotherapy 08/16/2019  . 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 Hypokalemia 07/23/2019  . Lesion of right lung 07/01/2019  . Nephrolithiasis 07/01/2019  . Osteoarthritis 07/01/2019  . Cancer of transverse colon ([place]) 06/25/2019  . Encounter for antineoplastic chemotherapy 06/25/2019  . Goals of care, counseling/discussion 06/25/2019  . 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 Morbid obesity with BMI of 40.0-44.9, adult ([place]) 01/27/2019  . Elevated CEA 12/24/2018  . Hypertension 05/10/2016  . Colon carcinoma ([place]) 02/03/2015  . Primary osteoarthritis of left knee 02/05/2014  . History of colon cancer, stage I 10/17/2013    Past Surgical Hx Past Surgical History:  Procedure Laterality Date  . COLON SURGERY    . COLONOSCOPY N/A 10/02/2014   Procedure: COLONOSCOPY;  Surgeon: Josefine Class, MD;  Location: [facility] ENDOSCOPY;  Service: Endoscopy;  Laterality: N/A;  . COLONOSCOPY WITH PROPOFOL N/A 02/16/2017   Procedure: COLONOSCOPY WITH PROPOFOL;  Surgeon: Manya Silvas, MD;  Location: [facility] ENDOSCOPY;  Service: Endoscopy;  Laterality: N/A;  . EUS N/A 04/17/2019   Procedure: FULL UPPER ENDOSCOPIC ULTRASOUND (EUS) RADIAL;  Surgeon: Holly Bodily, MD;  Location: [facility] ENDOSCOPY;  Service: Gastroenterology;  Laterality: N/A;  . KIDNEY STONE SURGERY    . PARTIAL COLECTOMY  10/17/2013  . PORTACATH PLACEMENT Right 06/13/2019   Procedure: INSERTION PORT-A-CATH;  Surgeon: Benjamine Sprague, DO;  Location: [facility] ORS;  Service: General;  Laterality: Right;    Medications Prior to Admission medications   Medication Sig Start Date End Date Taking? Authorizing Provider  amLODipine (NORVASC) 2.5 MG tablet Take 2.5 mg by mouth daily.  01/27/19 01/27/20  [provider]  aspirin EC 81 MG tablet Take 81  mg by mouth daily.    [provider]  cephALEXin (KEFLEX) 500 MG capsule Take by mouth. 09/05/19 09/12/19  [provider]  Cholecalciferol (VITAMIN D) 125 MCG (5000 UT) CAPS Take 5,000 mg by mouth.    [provider]  docusate sodium ([employer] STOOL SOFTENER) 100 MG capsule Take 100 mg by mouth 2 (two) times daily.    [provider]  HYDROcodone-acetaminophen (NORCO/VICODIN) 5-325 MG tablet Take 1 tablet by mouth every 6 (six) hours as needed for moderate pain (up to 3 doses for moderate pain.).     [provider]  ibuprofen (ADVIL) 800 MG tablet Take 800 mg by mouth every 8 (eight) hours as needed.    [provider]  lidocaine-prilocaine (EMLA) cream Apply to affected area once 06/09/19   Lequita Asal, MD  loratadine (CLARITIN) 10 MG tablet Take 10 mg by mouth daily.    [provider]  olmesartan (BENICAR) 20 MG tablet Take 20 mg by mouth daily. 09/26/17 06/08/20  [provider]  ondansetron (ZOFRAN) 8 MG tablet Take 1 tablet (8 mg total) by mouth 2 (two) times daily as needed for refractory nausea / vomiting. Start on day 3 after chemotherapy. Patient not taking: Reported on 08/27/2019 06/09/19   Lequita Asal, MD  potassium chloride SA (KLOR-CON M20) 20 MEQ tablet Take by mouth. 07/09/19   [provider]  Probiotic Product (PROBIOTIC DAILY PO) Take 1 tablet by mouth daily.    [provider]    Allergies Patient has no known allergies.  Family Hx Family History  Problem Relation Age of Onset  . Cancer Mother   . Breast cancer Mother   . COPD Father     Social Hx Social History   Tobacco Use  . Smoking status: Never Smoker  . Smokeless tobacco: Never Used  Substance Use Topics  . Alcohol use: Yes    Comment: socially   . Drug use: No     Review of Systems  Constitutional: Negative for fever. Negative for chills.  Positive for low blood pressure. Eyes: Negative for visual  changes. ENT: Negative for sore throat. Cardiovascular: Negative for chest pain. Respiratory: Negative for shortness of breath. Gastrointestinal: Negative for nausea. Negative for vomiting.  Genitourinary: Negative for dysuria. Musculoskeletal: Positive for bilateral foot swelling. Skin: Negative for rash. Neurological: Negative for headaches.   Physical Exam  Vital Signs: ED Triage Vitals  Enc Vitals Group     BP 09/10/19 1139 123/69     Pulse Rate 09/10/19 1141 78     Resp 09/10/19 1139 15     Temp 09/10/19 1141 97.6 F (36.4 C)     Temp Source 09/10/19 1141 Oral     SpO2 09/10/19 1141 97 %     Weight 09/10/19 1146 272 lb (123.4 kg)     Height 09/10/19 1146 5\' 8"  (1.727 m)     Head Circumference --      Peak Flow --      Pain Score 09/10/19 1146 5     Pain Loc --      Pain Edu? --      Excl. in [place]? --  Constitutional: Alert and oriented. Well appearing. NAD. Head: Normocephalic. Atraumatic. Eyes: Conjunctivae clear. Sclera anicteric. Pupils equal and symmetric. Nose: No masses or lesions. No congestion or rhinorrhea. Mouth/Throat: Wearing mask.  Neck: No stridor. Trachea midline.  Cardiovascular: Normal rate, regular rhythm. Extremities well perfused. Respiratory: Normal respiratory effort.  Lungs CTAB. Gastrointestinal: Soft. Non-distended. Non-tender.  Genitourinary: Deferred. Musculoskeletal: Mild to moderate edema to the dorsum of the bilateral feet, left slightly greater than right.  Trace pretibial edema bilaterally.  No significant asymmetry.  No erythema, warmth, induration, fluctuance, crepitance or evidence of infection.  FROM bilateral knees and ankles. Neurologic:  Normal speech and language. No gross focal or lateralizing neurologic deficits are appreciated.  Skin: Skin is warm, dry and intact. No rash noted. Psychiatric: Mood and affect are appropriate for situation.  EKG  Personally reviewed and interpreted by myself.   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 No STEMI    Radiology  Personally reviewed available imaging myself.   CXR - IMPRESSION:  No acute abnormality seen.  US DVT -  IMPRESSION:  No evidence of deep venous thrombosis in either lower extremity,  although there is limited evaluation of the bilateral calf veins due  to soft tissue edema.     Procedures  Procedure(s) performed (including critical care):  Procedures   Initial Impression / Assessment and Plan / MDM / ED Course  79 y.o. male who presents to the ED for hypotension noted in clinic, patient asymptomatic.  Only complaint is of intermittent swelling to the bilateral feet.  Initial blood pressure on arrival 90/50, however improved to 120s over 60s on recheck without intervention.  Ddx: dehydration (though this seems less likely based on history), erroneous reading, atypical ACS, new onset heart failure, UTI.  With regards to his leg swelling, consider infusion side effect, side effect from Norvasc, DVT, venous/lymph stasis, gravitational.  No evidence of infection by exam.  Will plan for urine studies, labs, ultrasound, chest x-ray, IVF.  UA negative for infection.  Ultrasound negative for DVT.  CXR negative.  Awaiting BNP and troponin.  Blood pressures have remained stable, no recurrence of hypotension since his arrival.  As such, if remainder of blood work is unremarkable, anticipate discharge.   _______________________________   As part of my medical decision making I have reviewed available labs, radiology tests, reviewed old records/performed chart review, obtained additional history from family, wife at bedside.    Final Clinical Impression(s) / ED Diagnosis  Final diagnoses:  Transient hypotension       Note:  This document was prepared using Dragon voice recognition software and may include unintentional  dictation errors.   Lilia Pro., MD 09/10/19 [phone]

## 2019-09-10 NOTE — ED Triage Notes (Signed)
Pt arrives via ems from cancer center. Ems reports pt became hypotensive and lightheaded prior to any treatment. Cancer center accessed port and drew labs while there. Pt a&o x 4 on arrival. NAD noted at this time. Pt reports recently taking antibiotic for redness and swelling in left foot.

## 2019-09-10 NOTE — ED Provider Notes (Signed)
Assumed care from Dr. Joan Mayans at 3 PM. Briefly, the patient is a 79 y.o. male with PMHx of  has a past medical history of Arthritis, Cancer ([place]), Cavitary lesion of lung, Chicken pox, Colon cancer ([place]), History of kidney stones, Hypertension, Lipoma of colon, Nephrolithiasis, Nephrolithiasis, Obesity, Shingles, and Tubular adenoma of colon. here with transient hypotension, now resolved. No CP. Labs very reassuring. Does admit to poor PO intake and has improved w/ fluids here. H/o recent recurrent leg swelling but DVT study neg. No known h/o CHF or valvular disease. Plan to f/u BNP, trop - if unremarkable,d /c.   Labs Reviewed  URINALYSIS, COMPLETE (UACMP) WITH MICROSCOPIC - Abnormal; Notable for the following components:      Result Value   Color, Urine AMBER (*)    APPearance CLOUDY (*)    Protein, ur 100 (*)    Leukocytes,Ua TRACE (*)    All other components within normal limits  BRAIN NATRIURETIC PEPTIDE  TROPONIN I (HIGH SENSITIVITY)  TROPONIN I (HIGH SENSITIVITY)    Course of Care: Lab work is very reassuring.  BNP is normal.  Additionally, on discussion with the patient, his symptoms seem to be related to when he gets his filgrastim.  Unclear whether this is adverse reaction to this, versus potentially exacerbation of his gout following the injections.  He also is on amlodipine which could be contributing.  Given his transient hypotension here, will have him hold his amlodipine until he follows up with his PCP on Friday.  Will have him discuss his relation of swelling to injections with his oncologist.  He has had improvement in his pain and swelling with ibuprofen, which would fit with possible gout relation.  Will have him take over-the-counter ibuprofen as needed.     Duffy Bruce, MD 09/10/19 1734

## 2019-09-11 LAB — CEA: CEA: 6.1 ng/mL — ABNORMAL HIGH (ref 0.0–4.7)

## 2019-09-12 ENCOUNTER — Inpatient Hospital Stay: Payer: Medicare Other

## 2019-09-12 NOTE — Progress Notes (Signed)
[facility]  [address], [address] [place], [place] [address] Phone: [phone]  Fax: [phone]   Clinic Day:  09/17/2019  Referring physician: Tracie Harrier, MD  Chief Complaint: Jared Gutierrez is a 79 y.o. male with metastaticcolon cancer who is seen for assessment prior to cycle #6 FOLFOX +/- oxlaiplatin.     HPI: The patient was last seen in the medical oncology clinic on 09/10/2019. At that time, he had some dizziness today. He denied any chest pain, shortness of breath or nausea.  He had left > right foot pain. Exam revealed episodes of tachycardia and bradycardia as well as pauses. Decisions were made to transport patient to the ER. Hematocrit was 38.5, hemoglobin 12.6, platelets 218,000, WBC 9,300.  CEA was 6.1. AST was 44. Magnesium was 1.7.   Patient was seen in the ER for hypotension. Patient complained of intermittent swelling to the bilateral feet. Initial blood pressure on arrival was 90/50 but improved to 120s/60s on recheck without intervention. Urinalysis was negative for infection.  Ultrasound was negative for DVT.  CXR was negative. Blood pressures remained stable with no recurrence of hypotension since arrival.  BNP was normal.   It was felt that some of his symptoms were related to filgrastim.  It was unclear whether that was an adverse reaction versus a potential exacerbation of his gout following these injections.  Amlodipine could be contributing. He had improvement in his pain and swelling with ibuprofen, and thus possibly related to gout.  Patient advised to take over-the-counter ibuprofen as needed.  He saw Lang Snow, NP in the [facility] on 09/12/2019 for bilateral leg swelling. He noted that his bilateral leg swelling worsened after his cancer treatment for 1 month. He was taking ibuprofen which relieved pain. His swelling was better that day and less painful. Patient reported snoring at night and hypersomnolence during the  day. He was directed to keep his legs elevated and wear compression stockings. A sleep study was ordered for possible sleep apnea. Echo was scheduled for 09/19/2019.   During the interim, the patient was doing "really good". He is walking without pain. He has some pain in his toes at times when walking. His left foot and leg edema have improved. He denies feeling dizzy or lightheaded. He denies any chest pain, palpitations or any other cardiac symptoms. He denies any B symptoms or abdominal symptoms. He has some numbness and tingling in the tips of his fingers.   His wife notes that he is doing a whole lot better. She notes that he will be receiving his compressions stockings this week. The patient is on ibuprofen. She notes he has antibiotics as needed. I encouraged wearing the compression stockings during the day and off at night.    Past Medical History:  Diagnosis Date  . Arthritis    OSTEOARTHRITIS  . Cancer ([place])   . Cavitary lesion of lung    RIGHT LOWER LOBE  . Chicken pox   . Colon cancer ([place])   . History of kidney stones   . Hypertension   . Lipoma of colon   . Nephrolithiasis   . Nephrolithiasis   . Obesity   . Shingles   . Tubular adenoma of colon    multiple fragments    Past Surgical History:  Procedure Laterality Date  . COLON SURGERY    . COLONOSCOPY N/A 10/02/2014   Procedure: COLONOSCOPY;  Surgeon: Josefine Class, MD;  Location: [facility] ENDOSCOPY;  Service: Endoscopy;  Laterality: N/A;  . COLONOSCOPY WITH PROPOFOL N/A 02/16/2017   Procedure: COLONOSCOPY WITH PROPOFOL;  Surgeon: Manya Silvas, MD;  Location: [facility] ENDOSCOPY;  Service: Endoscopy;  Laterality: N/A;  . EUS N/A 04/17/2019   Procedure: FULL UPPER ENDOSCOPIC ULTRASOUND (EUS) RADIAL;  Surgeon: Holly Bodily, MD;  Location: [facility] ENDOSCOPY;  Service: Gastroenterology;  Laterality: N/A;  . KIDNEY STONE SURGERY    . PARTIAL COLECTOMY  10/17/2013  . PORTACATH PLACEMENT Right 06/13/2019    Procedure: INSERTION PORT-A-CATH;  Surgeon: Benjamine Sprague, DO;  Location: [facility] ORS;  Service: General;  Laterality: Right;    Family History  Problem Relation Age of Onset  . Cancer Mother   . Breast cancer Mother   . COPD Father     Social History:  reports that he has never smoked. He has never used smokeless tobacco. He reports current alcohol use. He reports that he does not use drugs. 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He is married and his wife's name is [name] [phone]).  The patient is accompanied by his wife on the iPad today.  Allergies: No Known Allergies  Current Medications: Current Outpatient Medications  Medication Sig Dispense Refill  . aspirin EC 81 MG tablet Take 81 mg by mouth daily.    . Cholecalciferol (VITAMIN D) 125 MCG (5000 UT) CAPS Take 5,000 mg by mouth.    [name] HYDROcodone-acetaminophen (NORCO/VICODIN) 5-325 MG tablet Take 1 tablet by mouth every 6 (six) hours as needed for moderate pain (up to 3 doses for moderate pain.).     [name] ibuprofen (ADVIL) 800 MG tablet Take 800 mg by mouth every 8 (eight) hours as needed.    [name] olmesartan (BENICAR) 20 MG tablet Take 20 mg by mouth daily.    . Probiotic Product (PROBIOTIC DAILY PO) Take 1 tablet by mouth daily.    [name] docusate sodium ([employer] STOOL SOFTENER) 100 MG capsule Take 100 mg by mouth 2 (two) times daily.    [name] lidocaine-prilocaine (EMLA) cream Apply to affected area once (Patient not taking: Reported on 09/17/2019) 30 g 3  . loratadine (CLARITIN) 10 MG tablet Take 10 mg by mouth daily.    . ondansetron (ZOFRAN) 8 MG tablet Take 1 tablet (8 mg total) by mouth 2 (two) times daily as needed for refractory nausea / vomiting. Start on day 3 after chemotherapy. (Patient not taking: Reported on 08/27/2019) 30 tablet 1  . potassium chloride SA (KLOR-CON M20) 20 MEQ tablet Take by mouth.     No current facility-administered medications for this visit.    Review of Systems  Constitutional:  Negative for chills, diaphoresis, fever, malaise/fatigue and weight loss.       Doing "really good".  HENT: Negative.  Negative for congestion, ear pain, nosebleeds, sore throat and tinnitus.   Eyes: Negative.  Negative for blurred vision, double vision and photophobia.  Respiratory: Negative.  Negative for cough, sputum production, shortness of breath and wheezing.   Cardiovascular: Negative for chest pain, palpitations, orthopnea, leg swelling (left foot and leg; improved) and PND.  Gastrointestinal: Negative.  Negative for abdominal pain, blood in stool, constipation, diarrhea, heartburn, melena, nausea (well managed with ondansetron) and vomiting.  Genitourinary: Negative.  Negative for dysuria, frequency, hematuria and urgency.  Musculoskeletal: Negative for back pain, falls, joint pain (chronic bilateral knee pain) and myalgias.       Ambulates well. Pain in toes at times when walking.  Skin: Negative for itching and rash.  Redness in left foot (great toe).  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 Negative for dizziness, speech change, focal weakness, weakness and headaches.  Endo/Heme/Allergies: Does not bruise/bleed easily.  Psychiatric/Behavioral: Negative for depression and memory loss. The patient is not nervous/anxious and does not have insomnia.   All other systems reviewed and are negative.  Performance status (ECOG): 0-1  Vitals Blood pressure (!) 144/74, pulse 80, temperature (!) 96.7 F (35.9 C), temperature source Tympanic, resp. rate 19, weight 271 lb (122.9 kg), SpO2 99 %.   Physical Exam  Constitutional: He is oriented to person, place, and time. He appears well-developed and well-nourished. No distress. Face mask in place.  Patient sitting in a wheelchair in no acute distress.  He has a cane at his side.  HENT:  Head: Normocephalic and atraumatic.  Mouth/Throat: Oropharynx is clear and moist and mucous  membranes are normal. No oral lesions. No oropharyngeal exudate.  Short gray hair and beard.  Mask.  Eyes: Pupils are equal, round, and reactive to light. Conjunctivae and EOM are normal. Right eye exhibits no discharge. Left eye exhibits no discharge. No scleral icterus.  Neck: No JVD present.  Cardiovascular: Normal rate, regular rhythm and normal heart sounds. Exam reveals no gallop and no friction rub.  No murmur heard. Pulmonary/Chest: Effort normal and breath sounds normal. No respiratory distress. He has no wheezes. He has no rhonchi. He has no rales. He exhibits no tenderness.  Abdominal: Soft. Normal appearance and bowel sounds are normal. He exhibits no distension and no mass. There is no hepatosplenomegaly. There is no abdominal tenderness. There is no rebound, no guarding and no CVA tenderness.  Musculoskeletal:        General: Edema (left foot) present. No tenderness or deformity. Normal range of motion.     Cervical back: Normal range of motion and neck supple.  Lymphadenopathy:       Head (right side): No preauricular, no posterior auricular and no occipital adenopathy present.       Head (left side): No preauricular, no posterior auricular and no occipital adenopathy present.    He has no cervical adenopathy.    He has no axillary adenopathy.       Right: No supraclavicular adenopathy present.       Left: No supraclavicular adenopathy present.  Neurological: He is alert and oriented to person, place, and time. No sensory deficit.  Skin: Skin is warm, dry and intact. No bruising, no lesion and no rash noted. He is not diaphoretic. No erythema. No pallor.  Psychiatric: He has a normal mood and affect. His behavior is normal. Judgment and thought content normal.  Nursing note and vitals reviewed.     Appointment on 09/17/2019  Component Date Value Ref Range Status  . WBC 09/17/2019 6.4  4.0 - 10.5 K/uL Final  . RBC 09/17/2019 3.64* 4.22 - 5.81 MIL/uL Final  . Hemoglobin  09/17/2019 11.6* 13.0 - 17.0 g/dL Final  . HCT 09/17/2019 34.9* 39.0 - 52.0 % Final  . MCV 09/17/2019 95.9  80.0 - 100.0 fL Final  . MCH 09/17/2019 31.9  26.0 - 34.0 pg Final  . MCHC 09/17/2019 33.2  30.0 - 36.0 g/dL Final  . RDW 09/17/2019 14.8  11.5 - 15.5 % Final  . Platelets 09/17/2019 219  150 - 400 K/uL Final  . nRBC 09/17/2019 0.0  0.0 - 0.2 % Final  . Neutrophils Relative % 09/17/2019 57  % Final  .  Neutro Abs 09/17/2019 3.7  1.7 - 7.7 K/uL Final  . Lymphocytes Relative 09/17/2019 32  % Final  . Lymphs Abs 09/17/2019 2.0  0.7 - 4.0 K/uL Final  . Monocytes Relative 09/17/2019 8  % Final  . Monocytes Absolute 09/17/2019 0.5  0.1 - 1.0 K/uL Final  . Eosinophils Relative 09/17/2019 1  % Final  . Eosinophils Absolute 09/17/2019 0.1  0.0 - 0.5 K/uL Final  . Basophils Relative 09/17/2019 1  % Final  . Basophils Absolute 09/17/2019 0.0  0.0 - 0.1 K/uL Final  . Immature Granulocytes 09/17/2019 1  % Final  . Abs Immature Granulocytes 09/17/2019 0.04  0.00 - 0.07 K/uL Final   Performed at [facility], [address], [place], [place] [address]  . Sodium 09/17/2019 138  135 - 145 mmol/L Final  . Potassium 09/17/2019 3.6  3.5 - 5.1 mmol/L Final  . Chloride 09/17/2019 103  98 - 111 mmol/L Final  . CO2 09/17/2019 25  22 - 32 mmol/L Final  . Glucose, Bld 09/17/2019 200* 70 - 99 mg/dL Final   Glucose reference range applies only to samples taken after fasting for at least 8 hours.  . BUN 09/17/2019 16  8 - 23 mg/dL Final  . Creatinine, Ser 09/17/2019 0.81  0.61 - 1.24 mg/dL Final  . Calcium 09/17/2019 9.1  8.9 - 10.3 mg/dL Final  . Total Protein 09/17/2019 6.9  6.5 - 8.1 g/dL Final  . Albumin 09/17/2019 3.2* 3.5 - 5.0 g/dL Final  . AST 09/17/2019 41  15 - 41 U/L Final  . ALT 09/17/2019 36  0 - 44 U/L Final  . Alkaline Phosphatase 09/17/2019 56  38 - 126 U/L Final  . Total Bilirubin 09/17/2019 0.5  0.3 - 1.2 mg/dL Final  . GFR calc non Af Amer 09/17/2019 >60  >60 mL/min Final    . GFR calc Af Amer 09/17/2019 >60  >60 mL/min Final  . Anion gap 09/17/2019 10  5 - 15 Final   Performed at [facility], [address], [place], [place] [address]  . Magnesium 09/17/2019 1.5* 1.7 - 2.4 mg/dL Final   Performed at [facility], [address] [place], [place] [address]    Assessment:  Jared Gutierrez is a 79 y.o. male  withmetastatic colon cancer. He was diagnosed with stage I colon cancers/p transverse colectomy on 10/17/2013. 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 Tumor extended into the submucosa. Margins were negative. There was lymphovascular invasion. 14 lymph nodes were negative.Pathologic stagewas T1 N0.  Colonoscopyon 10/02/2014 noted several 3 mm polyps and an 8 mm polyp in the cecum and transverse colon. Pathology revealed tubular adenomas negative for high-grade dysplasia and malignancy. Colonoscopyon 02/16/2017 revealed 2 diminutive polyps in the descending colon. Pathology revealed tubular adenomas without dysplasia or malignancy.  CEAhas been followed: 3.1 on 04/27/2014, 2.3 on 11/11/2014, 3.0 on 05/12/2015, 2.7 on 11/10/2015, 3.2 on 05/10/2016, 2.8 on 11/01/2016, 3.3 on 04/25/2017, 3.1 on 10/31/2017, 4.0 on 05/01/2018, 7.0on 11/28/2018, 7.4 on 12/13/2018, 10.5 on 03/25/2019, 17.8on 06/25/2019, 16.2 on 07/09/2019, 13.8 on 07/23/2019, 11.9 on 07/30/2019, 6.1 on 08/27/2019, and 6.1 on 09/10/2019.  Chest, abdomen and pelvis CTon 12/20/2018 revealed postoperative findings of transverse colon resection without evidence of recurrent mass, definite lymphadenopathy, or distant metastatic disease to explain rising CEA. Therewereprominent sub-centimeter retroperitoneal and iliac lymph nodes, uncertain significance. There were postoperative findings about the left renal  hilum, of uncertain nature, with a broad-based postoperative  fat containing left-sided lumbar hernia.There was bibasilar bronchiectasis and scarring with multiple post infectious or inflammatory pneumatoceles, unchanged in comparison to CT date 06/23/2011.  PET scanon 04/02/2019 revealed a2.4 x 2.3 cm (SUV 11)hypermetabolic soft tissue density caudal and anterior to the pancreatic neckfavored to represent isolated peritoneal or nodal metastasis in the setting of prior transverse colonic resection (expected primary drainage). Although this was immediately adjacent to the pancreas, a fat plane was maintained, arguing strongly against a pancreatic primary. Otherwise,there wasno evidence of hypermetabolic metastasis.  Upper endoscopic ultrasoundon 04/17/2019 revealeda normal esophagus, stomach, duodenum, and pancreas.There was a 2.4 x 2.4 cm irregularmassin the retroperitoneum adjacent to, but not involving the pancreatic neck. FNA and core needle biopsy were performed. Pathologyrevealed adenocarcinoma compatible with a metastatic lesion of colorectal origin. Tumor cells were positive for CK20 and CDX2andnegative for CK7.  Omniseqon 05/15/2019 revealed + KRAS and TP53. Negative results included BRAF V600E, Her2, NRAS, NTRK, PD-L1 (<1%), and TMB 8.7/Mb (intermediate). MMR testingfrom his colon resection on 10/16/2013 was intact with a low probability of MSI-H.  He is s/p5cycles of FOLFOXchemotherapy (06/25/2019- 08/27/2019).  He received Neulasta after cycle #4 secondary to progressive leukopenia.  He has chronic right lower extremity edema. Right lower extremity duplex on 12/24/2018 revealed no DVT. He has a small right Baker's cyst.  Bilateral lower extremity duplex on 08/16/2019 revealed no evidence of deep venous thrombosis in either lower extremity. Increased pulsatility of the venous waveforms bilaterally. These findings suggested elevated right heart pressures. Differential considerations included tricuspid regurgitation, right  heart failure, pulmonary arterial hypertension and chronic COPD.  He was admitted to [facility] from 08/16/2019 - 08/17/2019 with right lower extremity cellulitis.  He was unable to bear weight.  He was treated with IV fluids, NSAIDs, colchicine, and broad antibiotics (vancomycin and Cefepime).  He was discharged on indomethacin x 5 days and Keflex 500 mg TID x 5 days.  He has received his COVID-19 vaccine.  Symptomatically, he is doing well.  He is walking without pain.  He has some numbness in his fingertips.  Plan: 1.   Labs today: CBC with diff, CMP, Mg. 2.   Metastaticcolon cancer Clinically, he is doing well. He initially presented with stage I disease s/p resection. PET scan on 04/02/2019 revealeda2.4 x 2.3 cm (SUV 11)soft tissue density caudal and anterior to the pancreatic neck. 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 Lesion was not resectable. NGS revealed no targetable mutation. MMR on original resection revealed a a low probability of MSI-H. He declined Avastin. He is s/p 5cycles of FOLFOX chemotherapy. He received Neulasta with cycle #4 and #5.  CEA has decreased from 17.8 to 6.1.  Neuropathy discussed.  Decrease oxaliplatin to 65 mg/m2.  Labs reviewed.  Begin cycle #6 FOLFOX chemotherapy.  Discuss symptom management.  He has antiemetics at home to use on a prn bases.  Interventions are adequate.     .  3.   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  Discuss avoiding Neulasta and if needed using short acting Zarxio. 4.   Hypomagnesemia  Magnesium 1.5.  Magnesium 2 g IV. 5.   Cycle #6 FOLFOX today. 6.   RTC in 2 days for pump disconnect. 7.   RTC in 1 week for labs (CBC with diff). 8.   RTC in 2 weeks  for MD assessment,  labs (CBC with diff, CMP, Mg, CEA), and cycle #7 FOLFOX chemotherapy.  I discussed the assessment and treatment plan with the patient.  The patient was provided an opportunity to ask questions and all were answered.  The patient agreed with the plan and demonstrated an understanding of the instructions.  The patient was advised to call back if the symptoms worsen or if the condition fails to improve as anticipated.   Lequita Asal, MD, PhD    09/17/2019, 10:07 AM  I, Selena Batten, am acting as a scribe for Lequita Asal, MD.  I, [place] Mike Gip, MD, have reviewed the above documentation for accuracy and completeness, and I agree with the above.

## 2019-09-17 ENCOUNTER — Other Ambulatory Visit: Payer: Self-pay

## 2019-09-17 ENCOUNTER — Inpatient Hospital Stay: Payer: Medicare Other | Attending: Hematology and Oncology | Admitting: Hematology and Oncology

## 2019-09-17 ENCOUNTER — Inpatient Hospital Stay: Payer: Medicare Other

## 2019-09-17 ENCOUNTER — Encounter: Payer: Self-pay | Admitting: Hematology and Oncology

## 2019-09-17 VITALS — BP 144/74 | HR 80 | Temp 96.7°F | Resp 19 | Wt 271.0 lb

## 2019-09-17 VITALS — BP 133/80 | HR 76 | Resp 18

## 2019-09-17 DIAGNOSIS — Z7982 Long term (current) use of aspirin: Secondary | ICD-10-CM | POA: Insufficient documentation

## 2019-09-17 DIAGNOSIS — M791 Myalgia, unspecified site: Secondary | ICD-10-CM | POA: Diagnosis not present

## 2019-09-17 DIAGNOSIS — E669 Obesity, unspecified: Secondary | ICD-10-CM | POA: Insufficient documentation

## 2019-09-17 DIAGNOSIS — L03031 Cellulitis of right toe: Secondary | ICD-10-CM | POA: Insufficient documentation

## 2019-09-17 DIAGNOSIS — Z5111 Encounter for antineoplastic chemotherapy: Secondary | ICD-10-CM | POA: Diagnosis not present

## 2019-09-17 DIAGNOSIS — G629 Polyneuropathy, unspecified: Secondary | ICD-10-CM | POA: Insufficient documentation

## 2019-09-17 DIAGNOSIS — L03115 Cellulitis of right lower limb: Secondary | ICD-10-CM | POA: Diagnosis not present

## 2019-09-17 DIAGNOSIS — C786 Secondary malignant neoplasm of retroperitoneum and peritoneum: Secondary | ICD-10-CM

## 2019-09-17 DIAGNOSIS — Z85038 Personal history of other malignant neoplasm of large intestine: Secondary | ICD-10-CM

## 2019-09-17 DIAGNOSIS — C184 Malignant neoplasm of transverse colon: Secondary | ICD-10-CM

## 2019-09-17 DIAGNOSIS — Z791 Long term (current) use of non-steroidal anti-inflammatories (NSAID): Secondary | ICD-10-CM | POA: Insufficient documentation

## 2019-09-17 DIAGNOSIS — I10 Essential (primary) hypertension: Secondary | ICD-10-CM | POA: Insufficient documentation

## 2019-09-17 DIAGNOSIS — Z79899 Other long term (current) drug therapy: Secondary | ICD-10-CM | POA: Diagnosis not present

## 2019-09-17 DIAGNOSIS — R600 Localized edema: Secondary | ICD-10-CM | POA: Diagnosis not present

## 2019-09-17 LAB — COMPREHENSIVE METABOLIC PANEL
ALT: 36 U/L (ref 0–44)
AST: 41 U/L (ref 15–41)
Albumin: 3.2 g/dL — ABNORMAL LOW (ref 3.5–5.0)
Alkaline Phosphatase: 56 U/L (ref 38–126)
Anion gap: 10 (ref 5–15)
BUN: 16 mg/dL (ref 8–23)
CO2: 25 mmol/L (ref 22–32)
Calcium: 9.1 mg/dL (ref 8.9–10.3)
Chloride: 103 mmol/L (ref 98–111)
Creatinine, Ser: 0.81 mg/dL (ref 0.61–1.24)
GFR calc Af Amer: >60 mL/min (ref >60)
GFR calc non Af Amer: >60 mL/min (ref >60)
Glucose, Bld: 200 mg/dL — ABNORMAL HIGH (ref 70–99)
Potassium: 3.6 mmol/L (ref 3.5–5.1)
Sodium: 138 mmol/L (ref 135–145)
Total Bilirubin: 0.5 mg/dL (ref 0.3–1.2)
Total Protein: 6.9 g/dL (ref 6.5–8.1)

## 2019-09-17 LAB — CBC WITH DIFFERENTIAL/PLATELET
Abs Immature Granulocytes: 0.04 10*3/uL (ref 0.00–0.07)
Basophils Absolute: 0 10*3/uL (ref 0.0–0.1)
Basophils Relative: 1 %
Eosinophils Absolute: 0.1 10*3/uL (ref 0.0–0.5)
Eosinophils Relative: 1 %
HCT: 34.9 % — ABNORMAL LOW (ref 39.0–52.0)
Hemoglobin: 11.6 g/dL — ABNORMAL LOW (ref 13.0–17.0)
Immature Granulocytes: 1 %
Lymphocytes Relative: 32 %
Lymphs Abs: 2 10*3/uL (ref 0.7–4.0)
MCH: 31.9 pg (ref 26.0–34.0)
MCHC: 33.2 g/dL (ref 30.0–36.0)
MCV: 95.9 fL (ref 80.0–100.0)
Monocytes Absolute: 0.5 10*3/uL (ref 0.1–1.0)
Monocytes Relative: 8 %
Neutro Abs: 3.7 10*3/uL (ref 1.7–7.7)
Neutrophils Relative %: 57 %
Platelets: 219 10*3/uL (ref 150–400)
RBC: 3.64 MIL/uL — ABNORMAL LOW (ref 4.22–5.81)
RDW: 14.8 % (ref 11.5–15.5)
WBC: 6.4 10*3/uL (ref 4.0–10.5)
nRBC: 0 % (ref 0.0–0.2)

## 2019-09-17 LAB — MAGNESIUM: Magnesium: 1.5 mg/dL — ABNORMAL LOW (ref 1.7–2.4)

## 2019-09-17 MED ORDER — SODIUM CHLORIDE 0.9 % IV SOLN
10.0000 mg | Freq: Once | INTRAVENOUS | Status: AC
  Administered 2019-09-17: 10 mg via INTRAVENOUS
  Filled 2019-09-17: qty 1

## 2019-09-17 MED ORDER — FLUOROURACIL CHEMO INJECTION 2.5 GM/50ML
411.5000 mg/m2 | Freq: Once | INTRAVENOUS | Status: AC
  Administered 2019-09-17: 1000 mg via INTRAVENOUS
  Filled 2019-09-17: qty 20

## 2019-09-17 MED ORDER — SODIUM CHLORIDE 0.9 % IV SOLN
2425.0000 mg/m2 | INTRAVENOUS | Status: DC
  Administered 2019-09-17: 5900 mg via INTRAVENOUS
  Filled 2019-09-17: qty 118

## 2019-09-17 MED ORDER — LEUCOVORIN CALCIUM INJECTION 350 MG
411.5000 mg/m2 | Freq: Once | INTRAVENOUS | Status: AC
  Administered 2019-09-17: 1000 mg via INTRAVENOUS
  Filled 2019-09-17: qty 50

## 2019-09-17 MED ORDER — PALONOSETRON HCL INJECTION 0.25 MG/5ML
0.2500 mg | Freq: Once | INTRAVENOUS | Status: AC
  Administered 2019-09-17: 0.25 mg via INTRAVENOUS
  Filled 2019-09-17: qty 5

## 2019-09-17 MED ORDER — MAGNESIUM SULFATE 2 GM/50ML IV SOLN
2.0000 g | Freq: Once | INTRAVENOUS | Status: AC
  Administered 2019-09-17: 2 g via INTRAVENOUS

## 2019-09-17 MED ORDER — HEPARIN SOD (PORK) LOCK FLUSH 100 UNIT/ML IV SOLN
500.0000 [IU] | Freq: Once | INTRAVENOUS | Status: DC | PRN
  Filled 2019-09-17: qty 5

## 2019-09-17 MED ORDER — OXALIPLATIN CHEMO INJECTION 100 MG/20ML
65.0000 mg/m2 | Freq: Once | INTRAVENOUS | Status: AC
  Administered 2019-09-17: 160 mg via INTRAVENOUS
  Filled 2019-09-17: qty 32

## 2019-09-17 MED ORDER — DEXTROSE 5 % IV SOLN
Freq: Once | INTRAVENOUS | Status: AC
  Filled 2019-09-17: qty 250

## 2019-09-17 NOTE — Progress Notes (Signed)
Patient is doing well. His foot and leg swelling has improved. He is not dizzy or lightheaded today.

## 2019-09-19 ENCOUNTER — Inpatient Hospital Stay: Payer: Medicare Other

## 2019-09-19 ENCOUNTER — Other Ambulatory Visit: Payer: Self-pay

## 2019-09-19 VITALS — BP 142/80 | HR 66 | Temp 97.0°F | Resp 18

## 2019-09-19 DIAGNOSIS — C786 Secondary malignant neoplasm of retroperitoneum and peritoneum: Secondary | ICD-10-CM

## 2019-09-19 DIAGNOSIS — Z5111 Encounter for antineoplastic chemotherapy: Secondary | ICD-10-CM | POA: Diagnosis not present

## 2019-09-19 DIAGNOSIS — Z85038 Personal history of other malignant neoplasm of large intestine: Secondary | ICD-10-CM

## 2019-09-19 MED ORDER — SODIUM CHLORIDE 0.9% FLUSH
10.0000 mL | INTRAVENOUS | Status: DC | PRN
  Administered 2019-09-19: 10 mL
  Filled 2019-09-19: qty 10

## 2019-09-19 MED ORDER — HEPARIN SOD (PORK) LOCK FLUSH 100 UNIT/ML IV SOLN
500.0000 [IU] | Freq: Once | INTRAVENOUS | Status: AC | PRN
  Administered 2019-09-19: 500 [IU]
  Filled 2019-09-19: qty 5

## 2019-09-24 ENCOUNTER — Other Ambulatory Visit: Payer: Self-pay

## 2019-09-24 ENCOUNTER — Inpatient Hospital Stay: Payer: Medicare Other

## 2019-09-24 DIAGNOSIS — C184 Malignant neoplasm of transverse colon: Secondary | ICD-10-CM

## 2019-09-24 DIAGNOSIS — Z5111 Encounter for antineoplastic chemotherapy: Secondary | ICD-10-CM | POA: Diagnosis not present

## 2019-09-24 LAB — CBC WITH DIFFERENTIAL/PLATELET
Abs Immature Granulocytes: 0.02 10*3/uL (ref 0.00–0.07)
Basophils Absolute: 0.1 10*3/uL (ref 0.0–0.1)
Basophils Relative: 1 %
Eosinophils Absolute: 0.1 10*3/uL (ref 0.0–0.5)
Eosinophils Relative: 1 %
HCT: 36.3 % — ABNORMAL LOW (ref 39.0–52.0)
Hemoglobin: 11.8 g/dL — ABNORMAL LOW (ref 13.0–17.0)
Immature Granulocytes: 0 %
Lymphocytes Relative: 28 %
Lymphs Abs: 2.1 10*3/uL (ref 0.7–4.0)
MCH: 31.2 pg (ref 26.0–34.0)
MCHC: 32.5 g/dL (ref 30.0–36.0)
MCV: 96 fL (ref 80.0–100.0)
Monocytes Absolute: 0.5 10*3/uL (ref 0.1–1.0)
Monocytes Relative: 7 %
Neutro Abs: 4.6 10*3/uL (ref 1.7–7.7)
Neutrophils Relative %: 63 %
Platelets: 207 10*3/uL (ref 150–400)
RBC: 3.78 MIL/uL — ABNORMAL LOW (ref 4.22–5.81)
RDW: 14.7 % (ref 11.5–15.5)
WBC: 7.4 10*3/uL (ref 4.0–10.5)
nRBC: 0 % (ref 0.0–0.2)

## 2019-09-24 NOTE — Progress Notes (Signed)
Pharmacist Chemotherapy Monitoring - Follow Up Assessment    I verify that I have reviewed each item in the below checklist:  . Regimen for the patient is scheduled for the appropriate day and plan matches scheduled date. [name] Appropriate non-routine labs are ordered dependent on drug ordered. . If applicable, additional medications reviewed and ordered per protocol based on lifetime cumulative doses and/or treatment regimen.   Plan for follow-up and/or issues identified: No . I-vent associated with next due treatment: No . MD and/or nursing notified: No  Jared Gutierrez K 09/24/2019 8:26 AM

## 2019-09-29 ENCOUNTER — Other Ambulatory Visit: Payer: Self-pay | Admitting: *Deleted

## 2019-09-29 DIAGNOSIS — Z85038 Personal history of other malignant neoplasm of large intestine: Secondary | ICD-10-CM

## 2019-09-29 NOTE — Progress Notes (Signed)
[facility]  [address], [address] [place], [place] [address] Phone: [phone]  Fax: [phone]   Clinic Day:  10/01/2019  Referring physician: Tracie Harrier, MD  Chief Complaint: Jared Gutierrez is a 79 y.o. male with  metastaticcolon cancerwho is seen for assessment prior to cycle #7 FOLFOX +/- oxaliiplatin.    HPI: The patient was last seen in the medical oncology clinic on 09/17/2019. At that time, he was doing well. He was able to walk without any pain except for occasional slight pain in his toes. He described some numbness and tingling in the tips of his fingers.  Hematocrit was 34.9, hemoglobin 11.6, MCV 95.9, platelets 219,000, WBC 6400, ANC 3700.  Magnesium was 1.5.  He received cycle #6 FOLFOX chemotherapy.  He received 2 gm of magnesium.  Decision was made to hold Neulasta.  During the interim, he is doing pretty good today. He had no trouble after his last treatment except some slight sensitivity in his fingers and toes when he is cold. He admits to some neuropathy in his fingers even when it is not cold but he is still able to perform day-to-day activities without any pain. The swelling in his toes has decreased.    Past Medical History:  Diagnosis Date  . Arthritis    OSTEOARTHRITIS  . Cancer ([place])   . Cavitary lesion of lung    RIGHT LOWER LOBE  . Chicken pox   . Colon cancer ([place])   . History of kidney stones   . Hypertension   . Lipoma of colon   . Nephrolithiasis   . Nephrolithiasis   . Obesity   . Shingles   . Tubular adenoma of colon    multiple fragments    Past Surgical History:  Procedure Laterality Date  . COLON SURGERY    . COLONOSCOPY N/A 10/02/2014   Procedure: COLONOSCOPY;  Surgeon: Josefine Class, MD;  Location: [facility] ENDOSCOPY;  Service: Endoscopy;  Laterality: N/A;  . COLONOSCOPY WITH PROPOFOL N/A 02/16/2017   Procedure: COLONOSCOPY WITH PROPOFOL;  Surgeon: Manya Silvas, MD;  Location: [facility]  ENDOSCOPY;  Service: Endoscopy;  Laterality: N/A;  . EUS N/A 04/17/2019   Procedure: FULL UPPER ENDOSCOPIC ULTRASOUND (EUS) RADIAL;  Surgeon: Holly Bodily, MD;  Location: [facility] ENDOSCOPY;  Service: Gastroenterology;  Laterality: N/A;  . KIDNEY STONE SURGERY    . PARTIAL COLECTOMY  10/17/2013  . PORTACATH PLACEMENT Right 06/13/2019   Procedure: INSERTION PORT-A-CATH;  Surgeon: Benjamine Sprague, DO;  Location: [facility] ORS;  Service: General;  Laterality: Right;    Family History  Problem Relation Age of Onset  . Cancer Mother   . Breast cancer Mother   . COPD Father     Social History:  reports that he has never smoked. He has never used smokeless tobacco. He reports current alcohol use. He reports that he does not use drugs. 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He is married and his wife's name is [name] [phone]).  The patient is accompanied by his wide on the iPad today.  Allergies: No Known Allergies  Current Medications: Current Outpatient Medications  Medication Sig Dispense Refill  . aspirin EC 81 MG tablet Take 81 mg by mouth daily.    . Cholecalciferol (VITAMIN D) 125 MCG (5000 UT) CAPS Take 5,000 mg by mouth.    . docusate sodium ([employer] STOOL SOFTENER) 100 MG capsule Take 100 mg by mouth 2 (two) times daily.    [name]  HYDROcodone-acetaminophen (NORCO/VICODIN) 5-325 MG tablet Take 1 tablet by mouth every 6 (six) hours as needed for moderate pain (up to 3 doses for moderate pain.).     [name] ibuprofen (ADVIL) 800 MG tablet Take 800 mg by mouth every 8 (eight) hours as needed.    . lidocaine-prilocaine (EMLA) cream Apply to affected area once 30 g 3  . olmesartan (BENICAR) 20 MG tablet Take 20 mg by mouth daily.    . ondansetron (ZOFRAN) 8 MG tablet Take 1 tablet (8 mg total) by mouth 2 (two) times daily as needed for refractory nausea / vomiting. Start on day 3 after chemotherapy. 30 tablet 1  . Probiotic Product (PROBIOTIC DAILY PO) Take 1 tablet by  mouth daily.    [name] loratadine (CLARITIN) 10 MG tablet Take 10 mg by mouth daily.    . potassium chloride SA (KLOR-CON M20) 20 MEQ tablet Take by mouth.     No current facility-administered medications for this visit.    Review of Systems  Constitutional: Positive for weight loss (7 lb). Negative for chills, diaphoresis, fever and malaise/fatigue.       Doing 'good'. Ambulating with a cane.   HENT: Negative for congestion, ear discharge, ear pain, hearing loss, sore throat and tinnitus.   Eyes: Negative for blurred vision and double vision.  Respiratory: Negative for cough and shortness of breath.   Cardiovascular: Negative for chest pain, palpitations, leg swelling (left foot and leg; improved) and PND.  Gastrointestinal: Negative for abdominal pain, blood in stool, constipation, diarrhea, heartburn, melena, nausea (well managed with ondansetron) and vomiting.  Genitourinary: Negative for dysuria, flank pain, frequency, hematuria and urgency.  Musculoskeletal: Positive for joint pain (chronic bilateral knee pain). Negative for back pain, falls and myalgias.       Ambulates well. Pain in toes at times when walking  Skin: Negative for itching and rash.       Redness in left foot (great toe)  Neurological: Positive for tingling (finger tips) and sensory change (neuropathy in finger tips). Negative for dizziness, tremors, weakness and headaches.  Endo/Heme/Allergies: Does not bruise/bleed easily.  Psychiatric/Behavioral: Negative for depression. The patient is not nervous/anxious.     Performance status (ECOG): 0 - Asymptomatic  Vitals Blood pressure (!) 154/72, pulse 78, temperature (!) 97.5 F (36.4 C), temperature source Tympanic, resp. rate 18, weight 264 lb (119.7 kg), SpO2 99 %.   Physical Exam  Constitutional: He is oriented to person, place, and time. He appears well-developed and well-nourished. No distress. Face mask in place.  Patient needed assistance onto exam room table.  Patient had a cane by his side.   HENT:  Head: Normocephalic and atraumatic.  Right Ear: Hearing normal.  Left Ear: Hearing normal.  Mouth/Throat: Oropharynx is clear and moist and mucous membranes are normal. No oral lesions.  Short grey hair and beard.  Eyes: Pupils are equal, round, and reactive to light. Conjunctivae and EOM are normal. Right eye exhibits no discharge. Left eye exhibits no discharge. No scleral icterus.  Neck: No JVD present.  Cardiovascular: Normal rate, regular rhythm and normal heart sounds. Exam reveals no gallop and no friction rub.  No murmur heard. Pulmonary/Chest: Effort normal and breath sounds normal. He has no wheezes. He has no rhonchi. He has no rales.  Abdominal: Soft. Normal appearance and bowel sounds are normal. He exhibits no mass. There is no hepatosplenomegaly. There is no abdominal tenderness. There is no CVA tenderness.  Musculoskeletal:  General: No tenderness or edema. Normal range of motion.     Cervical back: Normal range of motion and neck supple.  Lymphadenopathy:    He has no cervical adenopathy.       Right cervical: No superficial cervical adenopathy present.   He has no axillary adenopathy.       Right: No inguinal adenopathy present.       Left: No inguinal adenopathy present.  Neurological: He is alert and oriented to person, place, and time.  Skin: Skin is warm, dry and intact. No bruising, no lesion and no rash noted. He is not diaphoretic. No erythema. No pallor.  Psychiatric: He has a normal mood and affect. His behavior is normal. Judgment and thought content normal.     Infusion on 10/01/2019  Component Date Value Ref Range Status  . Magnesium 10/01/2019 1.7  1.7 - 2.4 mg/dL Final   Performed at [facility], [address] [place], [place] [address]  . WBC 10/01/2019 4.8  4.0 - 10.5 K/uL Final  . RBC 10/01/2019 3.81* 4.22 - 5.81 MIL/uL Final  . Hemoglobin 10/01/2019 11.9* 13.0 - 17.0 g/dL Final  . HCT  10/01/2019 36.8* 39.0 - 52.0 % Final  . MCV 10/01/2019 96.6  80.0 - 100.0 fL Final  . MCH 10/01/2019 31.2  26.0 - 34.0 pg Final  . MCHC 10/01/2019 32.3  30.0 - 36.0 g/dL Final  . RDW 10/01/2019 15.0  11.5 - 15.5 % Final  . Platelets 10/01/2019 168  150 - 400 K/uL Final  . nRBC 10/01/2019 0.0  0.0 - 0.2 % Final  . Neutrophils Relative % 10/01/2019 61  % Final  . Neutro Abs 10/01/2019 2.9  1.7 - 7.7 K/uL Final  . Lymphocytes Relative 10/01/2019 31  % Final  . Lymphs Abs 10/01/2019 1.5  0.7 - 4.0 K/uL Final  . Monocytes Relative 10/01/2019 7  % Final  . Monocytes Absolute 10/01/2019 0.4  0.1 - 1.0 K/uL Final  . Eosinophils Relative 10/01/2019 1  % Final  . Eosinophils Absolute 10/01/2019 0.1  0.0 - 0.5 K/uL Final  . Basophils Relative 10/01/2019 0  % Final  . Basophils Absolute 10/01/2019 0.0  0.0 - 0.1 K/uL Final  . Immature Granulocytes 10/01/2019 0  % Final  . Abs Immature Granulocytes 10/01/2019 0.01  0.00 - 0.07 K/uL Final   Performed at [facility], [address] [place], [place] [address]  . Sodium 10/01/2019 139  135 - 145 mmol/L Final  . Potassium 10/01/2019 3.5  3.5 - 5.1 mmol/L Final  . Chloride 10/01/2019 105  98 - 111 mmol/L Final  . CO2 10/01/2019 24  22 - 32 mmol/L Final  . Glucose, Bld 10/01/2019 158* 70 - 99 mg/dL Final   Glucose reference range applies only to samples taken after fasting for at least 8 hours.  . BUN 10/01/2019 13  8 - 23 mg/dL Final  . Creatinine, Ser 10/01/2019 0.77  0.61 - 1.24 mg/dL Final  . Calcium 10/01/2019 9.2  8.9 - 10.3 mg/dL Final  . Total Protein 10/01/2019 6.9  6.5 - 8.1 g/dL Final  . Albumin 10/01/2019 3.5  3.5 - 5.0 g/dL Final  . AST 10/01/2019 53* 15 - 41 U/L Final  . ALT 10/01/2019 44  0 - 44 U/L Final  . Alkaline Phosphatase 10/01/2019 58  38 - 126 U/L Final  . Total Bilirubin 10/01/2019 0.9  0.3 - 1.2 mg/dL Final  . GFR calc non Af Wyvonnia Lora  10/01/2019 >60  >60 mL/min Final  . GFR calc Af Amer 10/01/2019 >60  >60 mL/min  Final  . Anion gap 10/01/2019 10  5 - 15 Final   Performed at [facility], [address], [place], [place] [address]    Assessment:  FRAZIER BALFOUR is a 79 y.o. male withmetastatic colon cancer. He was diagnosed with stage I colon cancers/p transverse colectomy on 10/17/2013. 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 Tumor extended into the submucosa. Margins were negative. There was lymphovascular invasion. 14 lymph nodes were negative.Pathologic stagewas T1 N0.  Colonoscopyon 10/02/2014 noted several 3 mm polyps and an 8 mm polyp in the cecum and transverse colon. Pathology revealed tubular adenomas negative for high-grade dysplasia and malignancy. Colonoscopyon 02/16/2017 revealed 2 diminutive polyps in the descending colon. Pathology revealed tubular adenomas without dysplasia or malignancy.  CEAhas been followed: 3.1 on 04/27/2014, 2.3 on 11/11/2014, 3.0 on 05/12/2015, 2.7 on 11/10/2015, 3.2 on 05/10/2016, 2.8 on 11/01/2016, 3.3 on 04/25/2017, 3.1 on 10/31/2017, 4.0 on 05/01/2018, 7.0on 11/28/2018, 7.4 on 12/13/2018, 10.5 on 03/25/2019, 17.8on 06/25/2019, 16.2 on 07/09/2019, 13.8 on 07/23/2019, 11.9 on 07/30/2019, 6.1 on 08/27/2019, and 6.1 on 09/10/2019.  Chest, abdomen and pelvis CTon 12/20/2018 revealed postoperative findings of transverse colon resection without evidence of recurrent mass, definite lymphadenopathy, or distant metastatic disease to explain rising CEA. Therewereprominent sub-centimeter retroperitoneal and iliac lymph nodes, uncertain significance. There were postoperative findings about the left renal hilum, of uncertain nature, with a broad-based postoperative fat containing left-sided lumbar hernia.There was bibasilar bronchiectasis and scarring with multiple post infectious or inflammatory pneumatoceles, unchanged in comparison to CT date  06/23/2011.  PET scanon 04/02/2019 revealed a2.4 x 2.3 cm (SUV 11)hypermetabolic soft tissue density caudal and anterior to the pancreatic neckfavored to represent isolated peritoneal or nodal metastasis in the setting of prior transverse colonic resection (expected primary drainage). Although this was immediately adjacent to the pancreas, a fat plane was maintained, arguing strongly against a pancreatic primary. Otherwise,there wasno evidence of hypermetabolic metastasis.  Upper endoscopic ultrasoundon 04/17/2019 revealeda normal esophagus, stomach, duodenum, and pancreas.There was a 2.4 x 2.4 cm irregularmassin the retroperitoneum adjacent to, but not involving the pancreatic neck. FNA and core needle biopsy were performed. Pathologyrevealed adenocarcinoma compatible with a metastatic lesion of colorectal origin. Tumor cells were positive for CK20 and CDX2andnegative for CK7.  Omniseqon 05/15/2019 revealed + KRAS and TP53. Negative results included BRAF V600E, Her2, NRAS, NTRK, PD-L1 (<1%), and TMB 8.7/Mb (intermediate). MMR testingfrom his colon resection on 10/16/2013 was intact with a low probability of MSI-H.  He is s/p6cyclesof FOLFOXchemotherapy (07/09/2019- 08/27/2019; 09/17/2019).He received Neulasta after cycle #4 and #5 secondary to progressive leukopenia.  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  He has chronic right lower extremity edema. Right lower extremity duplexon 12/24/2018 revealed no DVT. He has a small right Baker's cyst.Bilateral lower extremityduplexon 08/16/2019 revealed no evidence of deep venous thrombosis in either lower extremity. Increased pulsatility of the venous waveforms bilaterally. These findings suggestedelevated right heart pressures. Differential considerations includedtricuspid regurgitation, right heart failure, pulmonary arterial hypertension and chronic COPD.  Hewas admitted to [facility] 08/16/2019 -08/17/2019  with right lower extremitycellulitis.He was unable to bear weight. Hewas treated withIV fluids, NSAIDs, colchicine,and broad antibiotics (vancomycin and Cefepime).He was discharged onindomethacin x 5 daysand Keflex 500 mg TID x 5 days.  He has received his COVID-19 vaccine.  Symptomatically, he feels pretty good. 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  Plan: 1.  Labs today: CBC with diff, CMP, Mg, CEA. 2.   Metastaticcolon cancer Clinically, he is doing well. He initially presented with stage I disease s/p resection. PET scan on 04/02/2019 revealeda2.4 x 2.3 cm (SUV 11)soft tissue density caudal and anterior to the pancreatic neck. 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 Lesion was not resectable. NGS revealed no targetable mutation. MMR on original resection revealed a a low probability of MSI-H. He declined Avastin. He is s/p 6cycles of FOLFOX chemotherapy. He received Neulasta with cycle #4 and #5.             CEA has decreased from 17.8 to 6.4.             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                         Continue reduced dose oxaliplatin.  Labs reviewed.  Begin cycle #7 FOLFOX.  Anticipate use of Zarxio with this cycle to maintain treatment as WBC is declining. Discuss plan for restaging scans.  Discuss symptom management.  He has antiemetics at home to use on a prn bases.  Interventions are adequate.  3.   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  He is tolerated his last cycle well without flare.  Continue oxaliplatin with close monitoring. 4.   Cycle #7 FOLFOX today. 5.   Abdomen and pelvis CT on 10/10/2019. 6.   RTC on 10/08/2019 for labs (CBC  with diff) and +/- Zario x 1-2 days. 7.   RTC in 2 weeks for MD assessment, labs (CBC with diff, CMP, Mg, CEA), review of CT scan, and cycle #8 FOLFOX chemotherapy.  I discussed the assessment and treatment plan with the patient.  The patient was provided an opportunity to ask questions and all were answered.  The patient agreed with the plan and demonstrated an understanding of the instructions.  The patient was advised to call back if the symptoms worsen or if the condition fails to improve as anticipated.   Lequita Asal, MD, PhD    10/01/2019, 10:11 AM  I, Heywood Footman, am acting as Education administrator for [employer]. Mike Gip, MD, PhD.  I, Agness Sibrian C. Mike Gip, MD, have reviewed the above documentation for accuracy and completeness, and I agree with the above.

## 2019-10-01 ENCOUNTER — Inpatient Hospital Stay (HOSPITAL_BASED_OUTPATIENT_CLINIC_OR_DEPARTMENT_OTHER): Payer: Medicare Other | Admitting: Hematology and Oncology

## 2019-10-01 ENCOUNTER — Other Ambulatory Visit: Payer: Self-pay

## 2019-10-01 ENCOUNTER — Inpatient Hospital Stay: Payer: Medicare Other

## 2019-10-01 ENCOUNTER — Encounter: Payer: Self-pay | Admitting: Hematology and Oncology

## 2019-10-01 DIAGNOSIS — Z85038 Personal history of other malignant neoplasm of large intestine: Secondary | ICD-10-CM

## 2019-10-01 DIAGNOSIS — C184 Malignant neoplasm of transverse colon: Secondary | ICD-10-CM

## 2019-10-01 DIAGNOSIS — Z5111 Encounter for antineoplastic chemotherapy: Secondary | ICD-10-CM

## 2019-10-01 DIAGNOSIS — C786 Secondary malignant neoplasm of retroperitoneum and peritoneum: Secondary | ICD-10-CM

## 2019-10-01 LAB — COMPREHENSIVE METABOLIC PANEL
ALT: 44 U/L (ref 0–44)
AST: 53 U/L — ABNORMAL HIGH (ref 15–41)
Albumin: 3.5 g/dL (ref 3.5–5.0)
Alkaline Phosphatase: 58 U/L (ref 38–126)
Anion gap: 10 (ref 5–15)
BUN: 13 mg/dL (ref 8–23)
CO2: 24 mmol/L (ref 22–32)
Calcium: 9.2 mg/dL (ref 8.9–10.3)
Chloride: 105 mmol/L (ref 98–111)
Creatinine, Ser: 0.77 mg/dL (ref 0.61–1.24)
GFR calc Af Amer: >60 mL/min (ref >60)
GFR calc non Af Amer: >60 mL/min (ref >60)
Glucose, Bld: 158 mg/dL — ABNORMAL HIGH (ref 70–99)
Potassium: 3.5 mmol/L (ref 3.5–5.1)
Sodium: 139 mmol/L (ref 135–145)
Total Bilirubin: 0.9 mg/dL (ref 0.3–1.2)
Total Protein: 6.9 g/dL (ref 6.5–8.1)

## 2019-10-01 LAB — CBC WITH DIFFERENTIAL/PLATELET
Abs Immature Granulocytes: 0.01 10*3/uL (ref 0.00–0.07)
Basophils Absolute: 0 10*3/uL (ref 0.0–0.1)
Basophils Relative: 0 %
Eosinophils Absolute: 0.1 10*3/uL (ref 0.0–0.5)
Eosinophils Relative: 1 %
HCT: 36.8 % — ABNORMAL LOW (ref 39.0–52.0)
Hemoglobin: 11.9 g/dL — ABNORMAL LOW (ref 13.0–17.0)
Immature Granulocytes: 0 %
Lymphocytes Relative: 31 %
Lymphs Abs: 1.5 10*3/uL (ref 0.7–4.0)
MCH: 31.2 pg (ref 26.0–34.0)
MCHC: 32.3 g/dL (ref 30.0–36.0)
MCV: 96.6 fL (ref 80.0–100.0)
Monocytes Absolute: 0.4 10*3/uL (ref 0.1–1.0)
Monocytes Relative: 7 %
Neutro Abs: 2.9 10*3/uL (ref 1.7–7.7)
Neutrophils Relative %: 61 %
Platelets: 168 10*3/uL (ref 150–400)
RBC: 3.81 MIL/uL — ABNORMAL LOW (ref 4.22–5.81)
RDW: 15 % (ref 11.5–15.5)
WBC: 4.8 10*3/uL (ref 4.0–10.5)
nRBC: 0 % (ref 0.0–0.2)

## 2019-10-01 LAB — MAGNESIUM: Magnesium: 1.7 mg/dL (ref 1.7–2.4)

## 2019-10-01 MED ORDER — LEUCOVORIN CALCIUM INJECTION 350 MG
411.5000 mg/m2 | Freq: Once | INTRAVENOUS | Status: AC
  Administered 2019-10-01: 1000 mg via INTRAVENOUS
  Filled 2019-10-01: qty 50

## 2019-10-01 MED ORDER — PALONOSETRON HCL INJECTION 0.25 MG/5ML
0.2500 mg | Freq: Once | INTRAVENOUS | Status: AC
  Administered 2019-10-01: 0.25 mg via INTRAVENOUS
  Filled 2019-10-01: qty 5

## 2019-10-01 MED ORDER — DEXTROSE 5 % IV SOLN
Freq: Once | INTRAVENOUS | Status: AC
  Filled 2019-10-01: qty 250

## 2019-10-01 MED ORDER — SODIUM CHLORIDE 0.9 % IV SOLN
2425.0000 mg/m2 | INTRAVENOUS | Status: AC
  Administered 2019-10-01: 5900 mg via INTRAVENOUS
  Filled 2019-10-01: qty 118

## 2019-10-01 MED ORDER — FLUOROURACIL CHEMO INJECTION 2.5 GM/50ML
411.5000 mg/m2 | Freq: Once | INTRAVENOUS | Status: AC
  Administered 2019-10-01: 1000 mg via INTRAVENOUS
  Filled 2019-10-01: qty 20

## 2019-10-01 MED ORDER — SODIUM CHLORIDE 0.9 % IV SOLN
10.0000 mg | Freq: Once | INTRAVENOUS | Status: AC
  Administered 2019-10-01: 10 mg via INTRAVENOUS
  Filled 2019-10-01: qty 10

## 2019-10-01 MED ORDER — OXALIPLATIN CHEMO INJECTION 100 MG/20ML
65.0000 mg/m2 | Freq: Once | INTRAVENOUS | Status: AC
  Administered 2019-10-01: 160 mg via INTRAVENOUS
  Filled 2019-10-01: qty 12

## 2019-10-01 NOTE — Progress Notes (Signed)
Pharmacist Chemotherapy Monitoring - Follow Up Assessment    I verify that I have reviewed each item in the below checklist:  . Regimen for the patient is scheduled for the appropriate day and plan matches scheduled date. [name] Appropriate non-routine labs are ordered dependent on drug ordered. . If applicable, additional medications reviewed and ordered per protocol based on lifetime cumulative doses and/or treatment regimen.   Plan for follow-up and/or issues identified: Yes . I-vent associated with next due treatment: Yes . MD and/or nursing notified: Yes  Ifeoma Vallin K 10/01/2019 12:38 PM

## 2019-10-01 NOTE — Progress Notes (Signed)
Patient states he is feeling well. 

## 2019-10-02 LAB — CEA: CEA: 6.4 ng/mL — ABNORMAL HIGH (ref 0.0–4.7)

## 2019-10-03 ENCOUNTER — Inpatient Hospital Stay: Payer: Medicare Other

## 2019-10-03 ENCOUNTER — Other Ambulatory Visit: Payer: Self-pay

## 2019-10-03 DIAGNOSIS — Z5111 Encounter for antineoplastic chemotherapy: Secondary | ICD-10-CM | POA: Diagnosis not present

## 2019-10-03 DIAGNOSIS — Z85038 Personal history of other malignant neoplasm of large intestine: Secondary | ICD-10-CM

## 2019-10-03 DIAGNOSIS — C786 Secondary malignant neoplasm of retroperitoneum and peritoneum: Secondary | ICD-10-CM

## 2019-10-03 MED ORDER — SODIUM CHLORIDE 0.9% FLUSH
10.0000 mL | INTRAVENOUS | Status: DC | PRN
  Administered 2019-10-03: 10 mL
  Filled 2019-10-03: qty 10

## 2019-10-03 MED ORDER — HEPARIN SOD (PORK) LOCK FLUSH 100 UNIT/ML IV SOLN
500.0000 [IU] | Freq: Once | INTRAVENOUS | Status: AC | PRN
  Administered 2019-10-03: 500 [IU]
  Filled 2019-10-03: qty 5

## 2019-10-08 ENCOUNTER — Other Ambulatory Visit: Payer: Self-pay | Admitting: *Deleted

## 2019-10-08 ENCOUNTER — Other Ambulatory Visit: Payer: Self-pay

## 2019-10-08 ENCOUNTER — Inpatient Hospital Stay: Payer: Medicare Other

## 2019-10-08 VITALS — BP 103/63 | HR 76 | Temp 99.0°F | Resp 18

## 2019-10-08 DIAGNOSIS — Z5111 Encounter for antineoplastic chemotherapy: Secondary | ICD-10-CM | POA: Diagnosis not present

## 2019-10-08 DIAGNOSIS — C786 Secondary malignant neoplasm of retroperitoneum and peritoneum: Secondary | ICD-10-CM

## 2019-10-08 DIAGNOSIS — C184 Malignant neoplasm of transverse colon: Secondary | ICD-10-CM

## 2019-10-08 LAB — CBC WITH DIFFERENTIAL/PLATELET
Abs Immature Granulocytes: 0.01 10*3/uL (ref 0.00–0.07)
Basophils Absolute: 0 10*3/uL (ref 0.0–0.1)
Basophils Relative: 1 %
Eosinophils Absolute: 0.1 10*3/uL (ref 0.0–0.5)
Eosinophils Relative: 2 %
HCT: 36.3 % — ABNORMAL LOW (ref 39.0–52.0)
Hemoglobin: 12 g/dL — ABNORMAL LOW (ref 13.0–17.0)
Immature Granulocytes: 0 %
Lymphocytes Relative: 40 %
Lymphs Abs: 1.9 10*3/uL (ref 0.7–4.0)
MCH: 31.7 pg (ref 26.0–34.0)
MCHC: 33.1 g/dL (ref 30.0–36.0)
MCV: 95.8 fL (ref 80.0–100.0)
Monocytes Absolute: 0.3 10*3/uL (ref 0.1–1.0)
Monocytes Relative: 6 %
Neutro Abs: 2.4 10*3/uL (ref 1.7–7.7)
Neutrophils Relative %: 51 %
Platelets: 187 10*3/uL (ref 150–400)
RBC: 3.79 MIL/uL — ABNORMAL LOW (ref 4.22–5.81)
RDW: 14.4 % (ref 11.5–15.5)
WBC: 4.7 10*3/uL (ref 4.0–10.5)
nRBC: 0 % (ref 0.0–0.2)

## 2019-10-08 MED ORDER — HEPARIN SOD (PORK) LOCK FLUSH 100 UNIT/ML IV SOLN
500.0000 [IU] | Freq: Once | INTRAVENOUS | Status: AC
  Administered 2019-10-08: 500 [IU] via INTRAVENOUS
  Filled 2019-10-08: qty 5

## 2019-10-08 MED ORDER — FILGRASTIM-SNDZ 480 MCG/0.8ML IJ SOSY: 480.0000 ug | PREFILLED_SYRINGE | Freq: Once | INTRAMUSCULAR | Status: DC

## 2019-10-08 MED ORDER — SODIUM CHLORIDE 0.9% FLUSH
10.0000 mL | Freq: Once | INTRAVENOUS | Status: AC
  Administered 2019-10-08: 10 mL via INTRAVENOUS
  Filled 2019-10-08: qty 10

## 2019-10-09 ENCOUNTER — Inpatient Hospital Stay: Payer: Medicare Other

## 2019-10-09 ENCOUNTER — Other Ambulatory Visit: Payer: Self-pay

## 2019-10-09 ENCOUNTER — Ambulatory Visit
Admission: RE | Admit: 2019-10-09 | Discharge: 2019-10-09 | Disposition: A | Discharge status: Home / Self Care | Payer: Medicare Other | Source: Ambulatory Visit | Attending: Hematology and Oncology | Admitting: Hematology and Oncology

## 2019-10-09 DIAGNOSIS — C184 Malignant neoplasm of transverse colon: Secondary | ICD-10-CM | POA: Insufficient documentation

## 2019-10-09 DIAGNOSIS — C786 Secondary malignant neoplasm of retroperitoneum and peritoneum: Secondary | ICD-10-CM | POA: Diagnosis present

## 2019-10-09 DIAGNOSIS — Z5111 Encounter for antineoplastic chemotherapy: Secondary | ICD-10-CM | POA: Diagnosis not present

## 2019-10-09 LAB — CBC WITH DIFFERENTIAL/PLATELET
Abs Immature Granulocytes: 0.03 10*3/uL (ref 0.00–0.07)
Basophils Absolute: 0 10*3/uL (ref 0.0–0.1)
Basophils Relative: 1 %
Eosinophils Absolute: 0.1 10*3/uL (ref 0.0–0.5)
Eosinophils Relative: 2 %
HCT: 34.8 % — ABNORMAL LOW (ref 39.0–52.0)
Hemoglobin: 11.4 g/dL — ABNORMAL LOW (ref 13.0–17.0)
Immature Granulocytes: 1 %
Lymphocytes Relative: 36 %
Lymphs Abs: 2 10*3/uL (ref 0.7–4.0)
MCH: 31.1 pg (ref 26.0–34.0)
MCHC: 32.8 g/dL (ref 30.0–36.0)
MCV: 94.8 fL (ref 80.0–100.0)
Monocytes Absolute: 0.6 10*3/uL (ref 0.1–1.0)
Monocytes Relative: 11 %
Neutro Abs: 2.7 10*3/uL (ref 1.7–7.7)
Neutrophils Relative %: 49 %
Platelets: 187 10*3/uL (ref 150–400)
RBC: 3.67 MIL/uL — ABNORMAL LOW (ref 4.22–5.81)
RDW: 14.6 % (ref 11.5–15.5)
WBC: 5.4 10*3/uL (ref 4.0–10.5)
nRBC: 0 % (ref 0.0–0.2)

## 2019-10-09 MED ORDER — SODIUM CHLORIDE 0.9% FLUSH
10.0000 mL | INTRAVENOUS | Status: DC | PRN
  Administered 2019-10-09: 10 mL via INTRAVENOUS
  Filled 2019-10-09: qty 10

## 2019-10-09 MED ORDER — IOHEXOL 300 MG/ML  SOLN
150.0000 mL | Freq: Once | INTRAMUSCULAR | Status: AC | PRN
  Administered 2019-10-09: 125 mL via INTRAVENOUS

## 2019-10-09 MED ORDER — HEPARIN SOD (PORK) LOCK FLUSH 100 UNIT/ML IV SOLN
500.0000 [IU] | Freq: Once | INTRAVENOUS | Status: AC
  Administered 2019-10-09: 500 [IU] via INTRAVENOUS
  Filled 2019-10-09: qty 5

## 2019-10-09 NOTE — Progress Notes (Signed)
ANC 2.7 today. No zarxio injection required today.

## 2019-10-14 NOTE — Progress Notes (Signed)
[facility]  [address], [address] [place], [place] [address] Phone: [phone]  Fax: [phone]   Clinic Day:  10/15/2019  Referring physician: Tracie Harrier, MD  Chief Complaint: Jared Gutierrez is a 79 y.o. male with metastaticcolon cancerwho is seen for review of interval imaging studies and cycle #8 FOLFOX chemotherapy.   HPI: The patient was last seen in the medical oncology clinic on 10/01/2019. At that time, he was doing good. He noted some light sensitivity in his fingers and toes when exposed to the cold. Hematocrit was 36.8, hemoglobin 11.9, MCV 96.6, platelets 168,000, WBC 4800, ANC 2900. CEA was 6.4.  He received cycle #7 FOLFOX chemotherapy without Neulasta support.  Labs followed:  10/08/2019: Hematocrit 36.3, hemoglobin 12.0, MCV 95.8, platelets 187,000, WBC 4700, ANC 2400. 10/09/2019: Hematocrit 34.8, hemoglobin 11.4, MCV 94.8, platelets 187,000, WBC 5400, ANC 2700.   Abdomen and pelvis CT with contrast on 10/09/2019 revealed a 2.4 x 2.3 cm lobulated nodule anterior to the pancreatic neck, previously FDG PET avid, not changed in size although decreased in internal attenuation suggesting treatment response. There was no evidence of new metastatic disease in the abdomen or pelvis.  There were unchanged prominent subcentimeter retroperitoneal and celiac axis lymph nodes, not previously FDG avid and nonspecific. There were postoperative findings about the left renal hilum with a broad-based left-sided lumbar hernia and hepatic steatosis.  During the interim, he is doing 'well' today. He says things have been pretty good over the last couple of weeks. He experienced a little nausea and tingling. If his hands or feet touch something cold they begin to tingle and its very sensitive. He gets a tingling feeling in his throat whenever he drinks something cold. It did not affect his ability to eat or walk. He had a little diarrhea. He thinks he is  tolerating treatment well. He is interested in receiving  Zarxio post chemotherapy to prevent neutropenia. His wife thinks the low-dose shots will work better for him.    Past Medical History:  Diagnosis Date  . Arthritis    OSTEOARTHRITIS  . Cancer ([place])   . Cavitary lesion of lung    RIGHT LOWER LOBE  . Chicken pox   . Colon cancer ([place])   . History of kidney stones   . Hypertension   . Lipoma of colon   . Nephrolithiasis   . Nephrolithiasis   . Obesity   . Shingles   . Tubular adenoma of colon    multiple fragments    Past Surgical History:  Procedure Laterality Date  . COLON SURGERY    . COLONOSCOPY N/A 10/02/2014   Procedure: COLONOSCOPY;  Surgeon: Josefine Class, MD;  Location: [facility] ENDOSCOPY;  Service: Endoscopy;  Laterality: N/A;  . COLONOSCOPY WITH PROPOFOL N/A 02/16/2017   Procedure: COLONOSCOPY WITH PROPOFOL;  Surgeon: Manya Silvas, MD;  Location[facility] ENDOSCOPY;  Service: Endoscopy;  Laterality: N/A;  . EUS N/A 04/17/2019   Procedure: FULL UPPER ENDOSCOPIC ULTRASOUND (EUS) RADIAL;  Surgeon: Holly Bodily, MD;  Location: [facility] ENDOSCOPY;  Service: Gastroenterology;  Laterality: N/A;  . KIDNEY STONE SURGERY    . PARTIAL COLECTOMY  10/17/2013  . PORTACATH PLACEMENT Right 06/13/2019   Procedure: INSERTION PORT-A-CATH;  Surgeon: Benjamine Sprague, DO;  Location: [facility] ORS;  Service: General;  Laterality: Right;    Family History  Problem Relation Age of Onset  . Cancer Mother   . Breast cancer Mother   . COPD Father  Social History:  reports that he has never smoked. He has never used smokeless tobacco. He reports current alcohol use. He reports that he does not use drugs. 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He is married and his wife's name is [name] [phone]).The patient is accompanied by his wife on the iPad today.  Allergies: No Known Allergies  Current Medications: Current Outpatient Medications    Medication Sig Dispense Refill  . aspirin EC 81 MG tablet Take 81 mg by mouth daily.    . Cholecalciferol (VITAMIN D) 125 MCG (5000 UT) CAPS Take 5,000 mg by mouth.    [name] ibuprofen (ADVIL) 800 MG tablet Take 800 mg by mouth every 8 (eight) hours as needed.    . lidocaine-prilocaine (EMLA) cream Apply to affected area once 30 g 3  . olmesartan (BENICAR) 20 MG tablet Take 20 mg by mouth daily.    . Probiotic Product (PROBIOTIC DAILY PO) Take 1 tablet by mouth daily.    [name] docusate sodium ([employer] STOOL SOFTENER) 100 MG capsule Take 100 mg by mouth 2 (two) times daily.    [name] HYDROcodone-acetaminophen (NORCO/VICODIN) 5-325 MG tablet Take 1 tablet by mouth every 6 (six) hours as needed for moderate pain (up to 3 doses for moderate pain.).     [name] loratadine (CLARITIN) 10 MG tablet Take 10 mg by mouth daily.    . ondansetron (ZOFRAN) 8 MG tablet Take 1 tablet (8 mg total) by mouth 2 (two) times daily as needed for refractory nausea / vomiting. Start on day 3 after chemotherapy. (Patient not taking: Reported on 10/15/2019) 30 tablet 1  . potassium chloride SA (KLOR-CON M20) 20 MEQ tablet Take by mouth.     No current facility-administered medications for this visit.   Facility-Administered Medications Ordered in Other Visits  Medication Dose Route Frequency Provider Last Rate Last Admin  . sodium chloride flush (NS) 0.9 % injection 10 mL  10 mL Intracatheter PRN Lequita Asal, MD   10 mL at 10/03/19 1354    Review of Systems  Constitutional: Positive for weight loss (6 lb). Negative for chills, diaphoresis, fever and malaise/fatigue.       Doing "good". Ambulating with cane.  HENT: Negative for congestion, ear pain, nosebleeds, sinus pain and sore throat.   Eyes: Negative.  Negative for blurred vision and double vision.  Respiratory: Negative.  Negative for cough, hemoptysis, sputum production and shortness of breath.   Cardiovascular: Negative.  Negative for chest pain, palpitations and leg swelling  (left foot and leg; improved).  Gastrointestinal: Positive for diarrhea and nausea (well managed with ondanestron). Negative for abdominal pain, constipation, heartburn and vomiting.  Genitourinary: Negative.  Negative for dysuria, frequency and urgency.  Musculoskeletal: Positive for joint pain (chronic bilateral knee pain). Negative for back pain, falls and myalgias.  Skin: Negative for itching and rash.  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 Negative for dizziness, weakness and headaches.  Endo/Heme/Allergies: Does not bruise/bleed easily.  Psychiatric/Behavioral: Negative.  Negative for depression. The patient is not nervous/anxious.    Performance status (ECOG): 0 - Asymptomatic  Vitals Blood pressure (!) 145/77, pulse 87, temperature 98.4 F (36.9 C), temperature source Oral, resp. rate 18, weight 258 lb 9.6 oz (117.3 kg), SpO2 99 %.   Physical Exam  Constitutional: He is oriented to person, place, and time. He appears well-developed. Face mask in place.  Patient needed assistance onto exam room table.  Patient had a cane by his side.    HENT:  Head: Normocephalic and atraumatic.  Right Ear: Hearing and external ear normal.  Left Ear: Hearing normal.  Mouth/Throat: Oropharynx is clear and moist and mucous membranes are normal. No oral lesions.  Short grey hair and beard.   Eyes: Pupils are equal, round, and reactive to light. Conjunctivae and EOM are normal. Right eye exhibits no discharge. Left eye exhibits no discharge. No scleral icterus.  Neck: No JVD present.  Cardiovascular: Normal rate, regular rhythm, normal heart sounds and intact distal pulses. Exam reveals no gallop and no friction rub.  No murmur heard. Pulmonary/Chest: Effort normal and breath sounds normal. No respiratory distress. He has no wheezes. He has no rhonchi. He has no rales. He exhibits no tenderness.  Abdominal: Normal appearance and bowel sounds  are normal. He exhibits no distension and no mass. There is no hepatosplenomegaly. There is no abdominal tenderness. There is no rebound, no guarding and no CVA tenderness.  Musculoskeletal:        General: No tenderness or edema. Normal range of motion.     Cervical back: Normal range of motion and neck supple.  Lymphadenopathy:       Head (right side): No preauricular, no posterior auricular and no occipital adenopathy present.       Head (left side): No preauricular, no posterior auricular and no occipital adenopathy present.    He has no cervical adenopathy.       Right cervical: No superficial cervical adenopathy present.      Left cervical: No superficial cervical adenopathy present.    He has no axillary adenopathy.       Right: No inguinal and no supraclavicular adenopathy present.       Left: No inguinal and no supraclavicular adenopathy present.  Neurological: He is alert and oriented to person, place, and time.  Skin: Skin is warm, dry and intact. No bruising, no lesion and no rash noted. He is not diaphoretic. No erythema. No pallor.  Psychiatric: He has a normal mood and affect. His behavior is normal. Judgment and thought content normal.    Infusion on 10/15/2019  Component Date Value Ref Range Status  . Magnesium 10/15/2019 1.5* 1.7 - 2.4 mg/dL Final   Performed at [facility], [address], [place], [place] [address]  . WBC 10/15/2019 3.9* 4.0 - 10.5 K/uL Final  . RBC 10/15/2019 3.63* 4.22 - 5.81 MIL/uL Final  . Hemoglobin 10/15/2019 11.4* 13.0 - 17.0 g/dL Final  . HCT 10/15/2019 34.5* 39.0 - 52.0 % Final  . MCV 10/15/2019 95.0  80.0 - 100.0 fL Final  . MCH 10/15/2019 31.4  26.0 - 34.0 pg Final  . MCHC 10/15/2019 33.0  30.0 - 36.0 g/dL Final  . RDW 10/15/2019 14.7  11.5 - 15.5 % Final  . Platelets 10/15/2019 137* 150 - 400 K/uL Final  . nRBC 10/15/2019 0.0  0.0 - 0.2 % Final  . Neutrophils Relative % 10/15/2019 54  % Final  . Neutro Abs 10/15/2019 2.1   1.7 - 7.7 K/uL Final  . Lymphocytes Relative 10/15/2019 32  % Final  . Lymphs Abs 10/15/2019 1.2  0.7 - 4.0 K/uL Final  . Monocytes Relative 10/15/2019 12  % Final  . Monocytes Absolute 10/15/2019 0.5  0.1 - 1.0 K/uL Final  . Eosinophils Relative 10/15/2019 1  % Final  . Eosinophils Absolute 10/15/2019 0.0  0.0 - 0.5 K/uL Final  . Basophils Relative 10/15/2019 1  %  Final  . Basophils Absolute 10/15/2019 0.0  0.0 - 0.1 K/uL Final  . Immature Granulocytes 10/15/2019 0  % Final  . Abs Immature Granulocytes 10/15/2019 0.01  0.00 - 0.07 K/uL Final   Performed at [facility], [address], [place], [place] [address]  . Sodium 10/15/2019 139  135 - 145 mmol/L Final  . Potassium 10/15/2019 3.5  3.5 - 5.1 mmol/L Final  . Chloride 10/15/2019 106  98 - 111 mmol/L Final  . CO2 10/15/2019 25  22 - 32 mmol/L Final  . Glucose, Bld 10/15/2019 139* 70 - 99 mg/dL Final   Glucose reference range applies only to samples taken after fasting for at least 8 hours.  . BUN 10/15/2019 11  8 - 23 mg/dL Final  . Creatinine, Ser 10/15/2019 0.67  0.61 - 1.24 mg/dL Final  . Calcium 10/15/2019 9.1  8.9 - 10.3 mg/dL Final  . Total Protein 10/15/2019 6.9  6.5 - 8.1 g/dL Final  . Albumin 10/15/2019 3.3* 3.5 - 5.0 g/dL Final  . AST 10/15/2019 40  15 - 41 U/L Final  . ALT 10/15/2019 33  0 - 44 U/L Final  . Alkaline Phosphatase 10/15/2019 54  38 - 126 U/L Final  . Total Bilirubin 10/15/2019 0.8  0.3 - 1.2 mg/dL Final  . GFR calc non Af Amer 10/15/2019 >60  >60 mL/min Final  . GFR calc Af Amer 10/15/2019 >60  >60 mL/min Final  . Anion gap 10/15/2019 8  5 - 15 Final   Performed at [facility], [address], [place], [place] [address]    Assessment:  Jared Gutierrez is a 79 y.o. male  withmetastatic colon cancer. He was diagnosed with stage I colon cancers/p transverse colectomy on 10/17/2013. Pathologyrevealed a 1 cm moderately differentiated invasive adenocarcinoma arising in a 4.6 cm  tubulovillous adenoma with high-grade dysplasia. Tumor extended into the submucosa. Margins were negative. There was lymphovascular invasion. 14 lymph nodes were negative.Pathologic stagewas T1 N0.  Colonoscopyon 10/02/2014 noted several 3 mm polyps and an 8 mm polyp in the cecum and transverse colon. Pathology revealed tubular adenomas negative for high-grade dysplasia and malignancy. Colonoscopyon 02/16/2017 revealed 2 diminutive polyps in the descending colon. Pathology revealed tubular adenomas without dysplasia or malignancy.  CEAhas been followed: 3.1 on 04/27/2014, 2.3 on 11/11/2014, 3.0 on 05/12/2015, 2.7 on 11/10/2015, 3.2 on 05/10/2016, 2.8 on 11/01/2016, 3.3 on 04/25/2017, 3.1 on 10/31/2017, 4.0 on 05/01/2018, 7.0on 11/28/2018, 7.4 on 12/13/2018, 10.5 on 03/25/2019, 17.8on 06/25/2019, 16.2 on 07/09/2019, 13.8 on 07/23/2019, 11.9 on 07/30/2019, 6.1 on 08/27/2019, 6.1 on 09/10/2019, and 6.4 on 10/01/2019.  Chest, abdomen and pelvis CTon 12/20/2018 revealed postoperative findings of transverse colon resection without evidence of recurrent mass, definite lymphadenopathy, or distant metastatic disease to explain rising CEA. Therewereprominent sub-centimeter retroperitoneal and iliac lymph nodes, uncertain significance. There were postoperative findings about the left renal hilum, of uncertain nature, with a broad-based postoperative fat containing left-sided lumbar hernia.There was bibasilar bronchiectasis and scarring with multiple post infectious or inflammatory pneumatoceles, unchanged in comparison to CT date 06/23/2011.  PET scanon 04/02/2019 revealed a2.4 x 2.3 cm (SUV 11)hypermetabolic soft tissue density caudal and anterior to the pancreatic neckfavored to represent isolated peritoneal or nodal metastasis in the setting of prior transverse colonic resection (expected primary drainage). Although this was immediately adjacent to the pancreas, a fat plane was maintained,  arguing strongly against a pancreatic primary. Otherwise,there wasno evidence of hypermetabolic metastasis.  Upper endoscopic ultrasoundon 04/17/2019 revealeda normal  esophagus, stomach, duodenum, and pancreas.There was a 2.4 x 2.4 cm irregularmassin the retroperitoneum adjacent to, but not involving the pancreatic neck. FNA and core needle biopsy were performed. Pathologyrevealed adenocarcinoma compatible with a metastatic lesion of colorectal origin. Tumor cells were positive for CK20 and CDX2andnegative for CK7.  Omniseqon 05/15/2019 revealed + KRAS and TP53. Negative results included BRAF V600E, Her2, NRAS, NTRK, PD-L1 (<1%), and TMB 8.7/Mb (intermediate). MMR testingfrom his colon resection on 10/16/2013 was intact with a low probability of MSI-H.  He is s/p7cyclesof FOLFOXchemotherapy (07/09/2019- 08/27/2019; 09/17/2019 - 10/01/2019).He received Neulasta after cycle #4 and #5 secondary to progressive leukopenia.  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  Abdomen and pelvis CT on 10/09/2019 revealed a 2.4 x 2.3 cm lobulated nodule anterior to the pancreatic neck, previously FDG PET avid, not changed in size although decreased in internal attenuation suggesting treatment response. There was no evidence of new metastatic disease in the abdomen or pelvis.  There were unchanged prominent subcentimeter retroperitoneal and celiac axis lymph nodes, not previously FDG avid and nonspecific. There were postoperative findings about the left renal hilum with a broad-based left-sided lumbar hernia and hepatic steatosis.  He has chronic right lower extremity edema. Right lower extremity duplexon 12/24/2018 revealed no DVT. He has a small right Baker's cyst.Bilateral lower extremityduplexon 08/16/2019 revealed no evidence of deep venous thrombosis in either lower extremity. Increased pulsatility of the venous waveforms bilaterally. These findings suggestedelevated right  heart pressures. Differential considerations includedtricuspid regurgitation, right heart failure, pulmonary arterial hypertension and chronic COPD.  Hewas admitted to [facility] 08/16/2019 -08/17/2019 with right lower extremitycellulitis.He was unable to bear weight. Hewas treated withIV fluids, NSAIDs, colchicine,and broad antibiotics (vancomycin and Cefepime).He was discharged onindomethacin x 5 daysand Keflex 500 mg TID x 5 days.  He has received his COVID-19 vaccine.  Symptomatically, he is doing well.  He experienced a little bit of nausea, tingling in his fingertips, and cold neuropathy with his last cycle.  Exam is stable.  Plan: 1.  Labs today: CBC with diff, CMP, Mg, CEA. 2. Metastaticcolon cancer Clinically, he is doing well He initially presented with stage I disease s/p resection. PET scan on 04/02/2019 revealeda2.4 x 2.3 cm (SUV 11)soft tissue density caudal and anterior to the pancreatic neck. 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 Lesion was not resectable. NGS revealed no targetable mutation. MMR on original resection revealed a a low probability of MSI-H. He declined Avastin. He is s/p 7 cycles of FOLFOX chemotherapy. CEA has decreased from 17.8 to 6.9.  He has a mild neuropathy associated with oxaliplatin.  His dose has been decreased to 65 mg/m2.  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  Labs reviewed.  Begin cycle #8 FOLFOX chemotherapy.  3. Bilateral foot pain He has pseudogout associated with Neulasta support quiring delays in treatment.  Discussed using daily low-dose G-CSF (Zarxio) to maintain counts and prevent neutropenia.  Patient in agreement. 4. Hypomagnesemia  Magnersium 1.5  today.  Magnesium 2 gm IV. 5.   Cycle #8 FOLFOX today. 6.   RTC in 2 days for pump disconnect. 7.   RTC in 1 week for labs (CBC with diff) +/-  Zarxio daily x 1-3 days. 8.   RTC in 2 weeks for MD assessment, labs (CBC with diff, CMP, Mg), and cycle #9 FOLFOX chemotherapy.  I discussed the assessment and treatment plan with the patient.  The patient was provided an opportunity to ask questions and  all were answered.  The patient agreed with the plan and demonstrated an understanding of the instructions.  The patient was advised to call back if the symptoms worsen or if the condition fails to improve as anticipated.    Lequita Asal, MD, PhD    10/15/2019, 10:50 AM  I, Heywood Footman, am acting as Education administrator for [employer]. Mike Gip, MD, PhD.  I, Evelyn Aguinaldo C. Mike Gip, MD, have reviewed the above documentation for accuracy and completeness, and I agree with the above.

## 2019-10-15 ENCOUNTER — Inpatient Hospital Stay: Payer: Medicare Other

## 2019-10-15 ENCOUNTER — Other Ambulatory Visit: Payer: Self-pay

## 2019-10-15 ENCOUNTER — Inpatient Hospital Stay (HOSPITAL_BASED_OUTPATIENT_CLINIC_OR_DEPARTMENT_OTHER): Payer: Medicare Other | Admitting: Hematology and Oncology

## 2019-10-15 ENCOUNTER — Inpatient Hospital Stay: Payer: Medicare Other | Attending: Hematology and Oncology

## 2019-10-15 ENCOUNTER — Encounter: Payer: Self-pay | Admitting: Hematology and Oncology

## 2019-10-15 VITALS — BP 145/77 | HR 87 | Temp 98.4°F | Resp 18 | Wt 258.6 lb

## 2019-10-15 DIAGNOSIS — Z79899 Other long term (current) drug therapy: Secondary | ICD-10-CM | POA: Insufficient documentation

## 2019-10-15 DIAGNOSIS — M791 Myalgia, unspecified site: Secondary | ICD-10-CM | POA: Insufficient documentation

## 2019-10-15 DIAGNOSIS — Z7982 Long term (current) use of aspirin: Secondary | ICD-10-CM | POA: Insufficient documentation

## 2019-10-15 DIAGNOSIS — R600 Localized edema: Secondary | ICD-10-CM | POA: Diagnosis not present

## 2019-10-15 DIAGNOSIS — Z5111 Encounter for antineoplastic chemotherapy: Secondary | ICD-10-CM

## 2019-10-15 DIAGNOSIS — C184 Malignant neoplasm of transverse colon: Secondary | ICD-10-CM

## 2019-10-15 DIAGNOSIS — Z7189 Other specified counseling: Secondary | ICD-10-CM

## 2019-10-15 DIAGNOSIS — E669 Obesity, unspecified: Secondary | ICD-10-CM | POA: Insufficient documentation

## 2019-10-15 DIAGNOSIS — I10 Essential (primary) hypertension: Secondary | ICD-10-CM | POA: Diagnosis not present

## 2019-10-15 DIAGNOSIS — C786 Secondary malignant neoplasm of retroperitoneum and peritoneum: Secondary | ICD-10-CM

## 2019-10-15 DIAGNOSIS — Z791 Long term (current) use of non-steroidal anti-inflammatories (NSAID): Secondary | ICD-10-CM | POA: Insufficient documentation

## 2019-10-15 DIAGNOSIS — L03031 Cellulitis of right toe: Secondary | ICD-10-CM | POA: Diagnosis not present

## 2019-10-15 DIAGNOSIS — G629 Polyneuropathy, unspecified: Secondary | ICD-10-CM | POA: Insufficient documentation

## 2019-10-15 DIAGNOSIS — L03115 Cellulitis of right lower limb: Secondary | ICD-10-CM | POA: Insufficient documentation

## 2019-10-15 DIAGNOSIS — Z85038 Personal history of other malignant neoplasm of large intestine: Secondary | ICD-10-CM

## 2019-10-15 LAB — MAGNESIUM: Magnesium: 1.5 mg/dL — ABNORMAL LOW (ref 1.7–2.4)

## 2019-10-15 LAB — CBC WITH DIFFERENTIAL/PLATELET
Abs Immature Granulocytes: 0.01 10*3/uL (ref 0.00–0.07)
Basophils Absolute: 0 10*3/uL (ref 0.0–0.1)
Basophils Relative: 1 %
Eosinophils Absolute: 0 10*3/uL (ref 0.0–0.5)
Eosinophils Relative: 1 %
HCT: 34.5 % — ABNORMAL LOW (ref 39.0–52.0)
Hemoglobin: 11.4 g/dL — ABNORMAL LOW (ref 13.0–17.0)
Immature Granulocytes: 0 %
Lymphocytes Relative: 32 %
Lymphs Abs: 1.2 10*3/uL (ref 0.7–4.0)
MCH: 31.4 pg (ref 26.0–34.0)
MCHC: 33 g/dL (ref 30.0–36.0)
MCV: 95 fL (ref 80.0–100.0)
Monocytes Absolute: 0.5 10*3/uL (ref 0.1–1.0)
Monocytes Relative: 12 %
Neutro Abs: 2.1 10*3/uL (ref 1.7–7.7)
Neutrophils Relative %: 54 %
Platelets: 137 10*3/uL — ABNORMAL LOW (ref 150–400)
RBC: 3.63 MIL/uL — ABNORMAL LOW (ref 4.22–5.81)
RDW: 14.7 % (ref 11.5–15.5)
WBC: 3.9 10*3/uL — ABNORMAL LOW (ref 4.0–10.5)
nRBC: 0 % (ref 0.0–0.2)

## 2019-10-15 LAB — COMPREHENSIVE METABOLIC PANEL
ALT: 33 U/L (ref 0–44)
AST: 40 U/L (ref 15–41)
Albumin: 3.3 g/dL — ABNORMAL LOW (ref 3.5–5.0)
Alkaline Phosphatase: 54 U/L (ref 38–126)
Anion gap: 8 (ref 5–15)
BUN: 11 mg/dL (ref 8–23)
CO2: 25 mmol/L (ref 22–32)
Calcium: 9.1 mg/dL (ref 8.9–10.3)
Chloride: 106 mmol/L (ref 98–111)
Creatinine, Ser: 0.67 mg/dL (ref 0.61–1.24)
GFR calc Af Amer: >60 mL/min (ref >60)
GFR calc non Af Amer: >60 mL/min (ref >60)
Glucose, Bld: 139 mg/dL — ABNORMAL HIGH (ref 70–99)
Potassium: 3.5 mmol/L (ref 3.5–5.1)
Sodium: 139 mmol/L (ref 135–145)
Total Bilirubin: 0.8 mg/dL (ref 0.3–1.2)
Total Protein: 6.9 g/dL (ref 6.5–8.1)

## 2019-10-15 MED ORDER — FLUOROURACIL CHEMO INJECTION 2.5 GM/50ML
411.5000 mg/m2 | Freq: Once | INTRAVENOUS | Status: AC
  Administered 2019-10-15: 1000 mg via INTRAVENOUS
  Filled 2019-10-15: qty 20

## 2019-10-15 MED ORDER — SODIUM CHLORIDE 0.9 % IV SOLN
10.0000 mg | Freq: Once | INTRAVENOUS | Status: AC
  Administered 2019-10-15: 10 mg via INTRAVENOUS
  Filled 2019-10-15: qty 10

## 2019-10-15 MED ORDER — SODIUM CHLORIDE 0.9 % IV SOLN
2425.0000 mg/m2 | INTRAVENOUS | Status: AC
  Administered 2019-10-15: 5900 mg via INTRAVENOUS
  Filled 2019-10-15: qty 118

## 2019-10-15 MED ORDER — PALONOSETRON HCL INJECTION 0.25 MG/5ML
0.2500 mg | Freq: Once | INTRAVENOUS | Status: AC
  Administered 2019-10-15: 0.25 mg via INTRAVENOUS
  Filled 2019-10-15: qty 5

## 2019-10-15 MED ORDER — MAGNESIUM SULFATE 2 GM/50ML IV SOLN
2.0000 g | Freq: Once | INTRAVENOUS | Status: AC
  Administered 2019-10-15: 2 g via INTRAVENOUS
  Filled 2019-10-15: qty 50

## 2019-10-15 MED ORDER — LEUCOVORIN CALCIUM INJECTION 350 MG
411.5000 mg/m2 | Freq: Once | INTRAVENOUS | Status: AC
  Administered 2019-10-15: 1000 mg via INTRAVENOUS
  Filled 2019-10-15: qty 50

## 2019-10-15 MED ORDER — DEXTROSE 5 % IV SOLN
Freq: Once | INTRAVENOUS | Status: AC
  Filled 2019-10-15: qty 250

## 2019-10-15 MED ORDER — OXALIPLATIN CHEMO INJECTION 100 MG/20ML
65.0000 mg/m2 | Freq: Once | INTRAVENOUS | Status: AC
  Administered 2019-10-15: 160 mg via INTRAVENOUS
  Filled 2019-10-15: qty 32

## 2019-10-15 MED ORDER — SODIUM CHLORIDE 0.9 % IV SOLN
Freq: Once | INTRAVENOUS | Status: AC
  Filled 2019-10-15: qty 250

## 2019-10-15 NOTE — Progress Notes (Signed)
Patient here for oncology follow-up appointment,  concerns of Ct scan results. Swelling in legs better and sensitively in fingers about the same. No pain reported.

## 2019-10-16 LAB — CEA: CEA: 6.9 ng/mL — ABNORMAL HIGH (ref 0.0–4.7)

## 2019-10-17 ENCOUNTER — Other Ambulatory Visit: Payer: Self-pay

## 2019-10-17 ENCOUNTER — Inpatient Hospital Stay: Payer: Medicare Other

## 2019-10-17 VITALS — BP 135/73 | HR 72 | Temp 98.6°F | Resp 18

## 2019-10-17 DIAGNOSIS — Z5111 Encounter for antineoplastic chemotherapy: Secondary | ICD-10-CM | POA: Diagnosis not present

## 2019-10-17 DIAGNOSIS — C786 Secondary malignant neoplasm of retroperitoneum and peritoneum: Secondary | ICD-10-CM

## 2019-10-17 MED ORDER — HEPARIN SOD (PORK) LOCK FLUSH 100 UNIT/ML IV SOLN
INTRAVENOUS | Status: AC
  Filled 2019-10-17: qty 5

## 2019-10-17 MED ORDER — HEPARIN SOD (PORK) LOCK FLUSH 100 UNIT/ML IV SOLN
500.0000 [IU] | Freq: Once | INTRAVENOUS | Status: AC
  Administered 2019-10-17: 500 [IU] via INTRAVENOUS
  Filled 2019-10-17: qty 5

## 2019-10-22 ENCOUNTER — Inpatient Hospital Stay: Payer: Medicare Other

## 2019-10-22 ENCOUNTER — Other Ambulatory Visit: Payer: Self-pay

## 2019-10-22 VITALS — BP 112/68 | HR 77 | Temp 98.9°F | Resp 18

## 2019-10-22 DIAGNOSIS — C786 Secondary malignant neoplasm of retroperitoneum and peritoneum: Secondary | ICD-10-CM

## 2019-10-22 DIAGNOSIS — Z5111 Encounter for antineoplastic chemotherapy: Secondary | ICD-10-CM

## 2019-10-22 DIAGNOSIS — C184 Malignant neoplasm of transverse colon: Secondary | ICD-10-CM

## 2019-10-22 LAB — CBC WITH DIFFERENTIAL/PLATELET
Abs Immature Granulocytes: 0.01 10*3/uL (ref 0.00–0.07)
Basophils Absolute: 0 10*3/uL (ref 0.0–0.1)
Basophils Relative: 1 %
Eosinophils Absolute: 0 10*3/uL (ref 0.0–0.5)
Eosinophils Relative: 1 %
HCT: 36.4 % — ABNORMAL LOW (ref 39.0–52.0)
Hemoglobin: 12 g/dL — ABNORMAL LOW (ref 13.0–17.0)
Immature Granulocytes: 0 %
Lymphocytes Relative: 42 %
Lymphs Abs: 1.8 10*3/uL (ref 0.7–4.0)
MCH: 30.9 pg (ref 26.0–34.0)
MCHC: 33 g/dL (ref 30.0–36.0)
MCV: 93.8 fL (ref 80.0–100.0)
Monocytes Absolute: 0.2 10*3/uL (ref 0.1–1.0)
Monocytes Relative: 5 %
Neutro Abs: 2.1 10*3/uL (ref 1.7–7.7)
Neutrophils Relative %: 51 %
Platelets: 187 10*3/uL (ref 150–400)
RBC: 3.88 MIL/uL — ABNORMAL LOW (ref 4.22–5.81)
RDW: 13.9 % (ref 11.5–15.5)
WBC: 4.2 10*3/uL (ref 4.0–10.5)
nRBC: 0 % (ref 0.0–0.2)

## 2019-10-22 MED ORDER — FILGRASTIM-SNDZ 480 MCG/0.8ML IJ SOSY
480.0000 ug | PREFILLED_SYRINGE | Freq: Once | INTRAMUSCULAR | Status: AC
  Administered 2019-10-22: 480 ug via SUBCUTANEOUS
  Filled 2019-10-22: qty 0.8

## 2019-10-23 ENCOUNTER — Inpatient Hospital Stay: Payer: Medicare Other

## 2019-10-23 VITALS — BP 118/63 | HR 74 | Temp 97.9°F | Resp 18

## 2019-10-23 DIAGNOSIS — Z5111 Encounter for antineoplastic chemotherapy: Secondary | ICD-10-CM

## 2019-10-23 DIAGNOSIS — C184 Malignant neoplasm of transverse colon: Secondary | ICD-10-CM

## 2019-10-23 DIAGNOSIS — C786 Secondary malignant neoplasm of retroperitoneum and peritoneum: Secondary | ICD-10-CM

## 2019-10-23 LAB — CBC WITH DIFFERENTIAL/PLATELET
Abs Immature Granulocytes: 0.15 10*3/uL — ABNORMAL HIGH (ref 0.00–0.07)
Basophils Absolute: 0.1 10*3/uL (ref 0.0–0.1)
Basophils Relative: 0 %
Eosinophils Absolute: 0.1 10*3/uL (ref 0.0–0.5)
Eosinophils Relative: 0 %
HCT: 36.5 % — ABNORMAL LOW (ref 39.0–52.0)
Hemoglobin: 12.1 g/dL — ABNORMAL LOW (ref 13.0–17.0)
Immature Granulocytes: 1 %
Lymphocytes Relative: 12 %
Lymphs Abs: 2.4 10*3/uL (ref 0.7–4.0)
MCH: 31.4 pg (ref 26.0–34.0)
MCHC: 33.2 g/dL (ref 30.0–36.0)
MCV: 94.8 fL (ref 80.0–100.0)
Monocytes Absolute: 0.5 10*3/uL (ref 0.1–1.0)
Monocytes Relative: 2 %
Neutro Abs: 17.6 10*3/uL — ABNORMAL HIGH (ref 1.7–7.7)
Neutrophils Relative %: 85 %
Platelets: 199 10*3/uL (ref 150–400)
RBC: 3.85 MIL/uL — ABNORMAL LOW (ref 4.22–5.81)
RDW: 14.4 % (ref 11.5–15.5)
WBC: 20.8 10*3/uL — ABNORMAL HIGH (ref 4.0–10.5)
nRBC: 0.1 % (ref 0.0–0.2)

## 2019-10-23 LAB — COMPREHENSIVE METABOLIC PANEL
ALT: 44 U/L (ref 0–44)
AST: 49 U/L — ABNORMAL HIGH (ref 15–41)
Albumin: 3.5 g/dL (ref 3.5–5.0)
Alkaline Phosphatase: 80 U/L (ref 38–126)
Anion gap: 9 (ref 5–15)
BUN: 12 mg/dL (ref 8–23)
CO2: 23 mmol/L (ref 22–32)
Calcium: 9.3 mg/dL (ref 8.9–10.3)
Chloride: 106 mmol/L (ref 98–111)
Creatinine, Ser: 0.92 mg/dL (ref 0.61–1.24)
GFR calc Af Amer: >60 mL/min (ref >60)
GFR calc non Af Amer: >60 mL/min (ref >60)
Glucose, Bld: 162 mg/dL — ABNORMAL HIGH (ref 70–99)
Potassium: 3.5 mmol/L (ref 3.5–5.1)
Sodium: 138 mmol/L (ref 135–145)
Total Bilirubin: 0.5 mg/dL (ref 0.3–1.2)
Total Protein: 7.3 g/dL (ref 6.5–8.1)

## 2019-10-23 LAB — MAGNESIUM: Magnesium: 1.5 mg/dL — ABNORMAL LOW (ref 1.7–2.4)

## 2019-10-23 MED ORDER — MAGNESIUM SULFATE 2 GM/50ML IV SOLN
2.0000 g | Freq: Once | INTRAVENOUS | Status: AC
  Administered 2019-10-23: 2 g via INTRAVENOUS

## 2019-10-23 MED ORDER — MAGNESIUM SULFATE 2 GM/50ML IV SOLN
INTRAVENOUS | Status: AC
  Filled 2019-10-23: qty 50

## 2019-10-23 MED ORDER — SODIUM CHLORIDE 0.9 % IV SOLN
Freq: Once | INTRAVENOUS | Status: AC
  Filled 2019-10-23: qty 250

## 2019-10-23 MED ORDER — HEPARIN SOD (PORK) LOCK FLUSH 100 UNIT/ML IV SOLN
500.0000 [IU] | Freq: Once | INTRAVENOUS | Status: AC | PRN
  Administered 2019-10-23: 500 [IU]
  Filled 2019-10-23: qty 5

## 2019-10-23 MED ORDER — FILGRASTIM-SNDZ 480 MCG/0.8ML IJ SOSY: 480.0000 ug | PREFILLED_SYRINGE | Freq: Once | INTRAMUSCULAR | Status: DC

## 2019-10-24 ENCOUNTER — Inpatient Hospital Stay: Payer: Medicare Other | Attending: Hematology and Oncology

## 2019-10-27 NOTE — Progress Notes (Signed)
[facility]  [address], [address] [place], [place] [address] Phone: [phone]  Fax: [phone]   Clinic Day:  10/29/2019  Referring physician: Tracie Harrier, MD  Chief Complaint: Jared Gutierrez is a 79 y.o. male with metastaticcolon cancerwho is seen for assessment prior to cycle #9 FOLFOX chemotherapy.   HPI: The patient was last seen in the medical onoclogy clinic on 10/15/2019. At that time, he was doing well.  He noted a cold neuropathy.  Hematocrit was 34.5, hemoglobin 11.4, platelets 137,000, WBC 3,900 and ANC 2100.  CEA was 6.9. Magnesium was 1.5.   He received magnesium sulfate 2 gm IV.  He received cycle #8 FOLFOX chemotherapy.   Decision was made to avoid Neulasta and treat with daily GCSF if needed to prevent pseudo-gout.  Labs followed: 10/22/2019: Hematocrit 36.4, hemoglobin 12.0, platelets 187,000, WBC 4,200 ([place] 2100). 10/23/2019: Hematocrit 36.5, hemoglobin 12.1, platelets 199,000, WBC 20,800 (ANC 17,600). AST was 49. Magnesium was 1.5.   He received Zarxio 480 mcg SQ on 10/22/2019.  During the interim, he was doing "pretty good". After his last treatment he felt nauseous on Sunday morning.  Nausea resolved with medication. He denies diarrhea or vomiting. Patient states that his toes feel about the same; he has a little cold neuropathy. He has mild numbness and tingling in his fingers when in a cold room.   Past Medical History:  Diagnosis Date  . Arthritis    OSTEOARTHRITIS  . Cancer ([place])   . Cavitary lesion of lung    RIGHT LOWER LOBE  . Chicken pox   . Colon cancer ([place])   . History of kidney stones   . Hypertension   . Lipoma of colon   . Nephrolithiasis   . Nephrolithiasis   . Obesity   . Shingles   . Tubular adenoma of colon    multiple fragments    Past Surgical History:  Procedure Laterality Date  . COLON SURGERY    . COLONOSCOPY N/A 10/02/2014   Procedure: COLONOSCOPY;  Surgeon: Josefine Class, MD;   Location: [facility] ENDOSCOPY;  Service: Endoscopy;  Laterality: N/A;  . COLONOSCOPY WITH PROPOFOL N/A 02/16/2017   Procedure: COLONOSCOPY WITH PROPOFOL;  Surgeon: Manya Silvas, MD;  Location: [facility] ENDOSCOPY;  Service: Endoscopy;  Laterality: N/A;  . EUS N/A 04/17/2019   Procedure: FULL UPPER ENDOSCOPIC ULTRASOUND (EUS) RADIAL;  Surgeon: Holly Bodily, MD;  Location: [facility] ENDOSCOPY;  Service: Gastroenterology;  Laterality: N/A;  . KIDNEY STONE SURGERY    . PARTIAL COLECTOMY  10/17/2013  . PORTACATH PLACEMENT Right 06/13/2019   Procedure: INSERTION PORT-A-CATH;  Surgeon: Benjamine Sprague, DO;  Location: [facility] ORS;  Service: General;  Laterality: Right;    Family History  Problem Relation Age of Onset  . Cancer Mother   . Breast cancer Mother   . COPD Father     Social History:  reports that he has never smoked. He has never used smokeless tobacco. He reports current alcohol use. He reports that he does not use drugs. 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He is married and his wife's name is [name] [phone]).The patient is accompaied by [name] on the iPad today.  Allergies: No Known Allergies  Current Medications: Current Outpatient Medications  Medication Sig Dispense Refill  . aspirin EC 81 MG tablet Take 81 mg by mouth daily.    . Cholecalciferol (VITAMIN D) 125 MCG (5000 UT) CAPS Take 5,000 mg by mouth.    [name]  docusate sodium ([employer] STOOL SOFTENER) 100 MG capsule Take 100 mg by mouth 2 (two) times daily.    [name] HYDROcodone-acetaminophen (NORCO/VICODIN) 5-325 MG tablet Take 1 tablet by mouth every 6 (six) hours as needed for moderate pain (up to 3 doses for moderate pain.).     [name] ibuprofen (ADVIL) 800 MG tablet Take 800 mg by mouth every 8 (eight) hours as needed.    . lidocaine-prilocaine (EMLA) cream Apply to affected area once 30 g 3  . loratadine (CLARITIN) 10 MG tablet Take 10 mg by mouth daily.    [name] olmesartan (BENICAR) 20 MG tablet Take 20 mg by  mouth daily.    . ondansetron (ZOFRAN) 8 MG tablet Take 1 tablet (8 mg total) by mouth 2 (two) times daily as needed for refractory nausea / vomiting. Start on day 3 after chemotherapy. (Patient not taking: Reported on 10/15/2019) 30 tablet 1  . potassium chloride SA (KLOR-CON M20) 20 MEQ tablet Take by mouth.    . Probiotic Product (PROBIOTIC DAILY PO) Take 1 tablet by mouth daily.     No current facility-administered medications for this visit.   Facility-Administered Medications Ordered in Other Visits  Medication Dose Route Frequency Provider Last Rate Last Admin  . 0.9 %  sodium chloride infusion   Intravenous Once Ericah Scotto C, MD      . sodium chloride flush (NS) 0.9 % injection 10 mL  10 mL Intracatheter PRN Nolon Stalls C, MD   10 mL at 10/03/19 1354  . sodium chloride flush (NS) 0.9 % injection 10 mL  10 mL Intravenous PRN Lequita Asal, MD   10 mL at 10/29/19 0850    Review of Systems  Constitutional: Negative for chills, diaphoresis, fever, malaise/fatigue and weight loss (up 7 lbs).       Doing "pretty good".  Ambulating with cane.  HENT: Negative for congestion and sore throat.   Eyes: Negative for blurred vision and double vision.  Respiratory: Negative for cough and shortness of breath.   Cardiovascular: Negative for chest pain, palpitations and leg swelling (left foot and leg; improved).  Gastrointestinal: Positive for nausea (well managed with ondanestron). Negative for abdominal pain, constipation, diarrhea, heartburn and vomiting.  Genitourinary: Negative for frequency and urgency.  Musculoskeletal: Positive for joint pain (chronic bilateral knee pain). Negative for back pain, falls and myalgias.  Skin: Negative for itching and rash.  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 Negative for dizziness, weakness and headaches.  Endo/Heme/Allergies: Does not bruise/bleed easily.  Psychiatric/Behavioral:  Negative for depression. The patient is not nervous/anxious.   All other systems reviewed and are negative.  Performance status (ECOG): 0  Vitals Blood pressure (!) 144/75, pulse 88, temperature (!) 97.3 F (36.3 C), temperature source Tympanic, resp. rate 16, weight 265 lb 15.8 oz (120.7 kg), SpO2 97 %.   Physical Exam Constitutional:      Appearance: Normal appearance. He is well-developed. He is not diaphoretic.     Interventions: Face mask in place.     Comments: Patient needed assistance onto exam room table. Patient had a cane by his side.    HENT:     Head: Normocephalic and atraumatic.     Comments: White hair and beard.    Mouth/Throat:     Mouth: No oral lesions.  Eyes:     General: No scleral icterus.       Right eye: No discharge.  Left eye: No discharge.     Conjunctiva/sclera: Conjunctivae normal.     Pupils: Pupils are equal, round, and reactive to light.     Comments: Hazel eyes.  Neck:     Vascular: No JVD.  Cardiovascular:     Rate and Rhythm: Normal rate and regular rhythm.     Heart sounds: Normal heart sounds. No murmur heard.  No friction rub. No gallop.   Pulmonary:     Effort: Pulmonary effort is normal. No respiratory distress.     Breath sounds: Normal breath sounds. No wheezing, rhonchi or rales.  Chest:     Chest wall: No tenderness.  Abdominal:     General: Bowel sounds are normal. There is no distension.     Palpations: There is no mass.     Tenderness: There is no abdominal tenderness. There is no guarding or rebound.  Musculoskeletal:        General: No tenderness. Normal range of motion.     Cervical back: Normal range of motion and neck supple.     Right lower leg: Edema (trace) present.     Left lower leg: Edema (trace) present.  Lymphadenopathy:     Head:     Right side of head: No preauricular, posterior auricular or occipital adenopathy.     Left side of head: No preauricular, posterior auricular or occipital adenopathy.      Cervical: No cervical adenopathy.     Right cervical: No superficial cervical adenopathy.    Left cervical: No superficial cervical adenopathy.     Upper Body:     Right upper body: No supraclavicular adenopathy.     Left upper body: No supraclavicular adenopathy.     Lower Body: No right inguinal adenopathy. No left inguinal adenopathy.  Skin:    General: Skin is warm and dry.     Coloration: Skin is not pale.     Findings: No bruising, erythema, lesion or rash.     Comments: Trauma to fingernails.  Neurological:     Mental Status: He is alert and oriented to person, place, and time.  Psychiatric:        Behavior: Behavior normal.        Thought Content: Thought content normal.        Judgment: Judgment normal.    Appointment on 10/29/2019  Component Date Value Ref Range Status  . Magnesium 10/29/2019 1.6* 1.7 - 2.4 mg/dL Final   Performed at [facility], [address], [place], [place] [address]  . Sodium 10/29/2019 137  135 - 145 mmol/L Final  . Potassium 10/29/2019 3.6  3.5 - 5.1 mmol/L Final  . Chloride 10/29/2019 105  98 - 111 mmol/L Final  . CO2 10/29/2019 23  22 - 32 mmol/L Final  . Glucose, Bld 10/29/2019 226* 70 - 99 mg/dL Final   Glucose reference range applies only to samples taken after fasting for at least 8 hours.  . BUN 10/29/2019 10  8 - 23 mg/dL Final  . Creatinine, Ser 10/29/2019 0.87  0.61 - 1.24 mg/dL Final  . Calcium 10/29/2019 9.3  8.9 - 10.3 mg/dL Final  . Total Protein 10/29/2019 7.1  6.5 - 8.1 g/dL Final  . Albumin 10/29/2019 3.5  3.5 - 5.0 g/dL Final  . AST 10/29/2019 57* 15 - 41 U/L Final  . ALT 10/29/2019 47* 0 - 44 U/L Final  . Alkaline Phosphatase 10/29/2019 60  38 - 126 U/L Final  . Total Bilirubin 10/29/2019  0.8  0.3 - 1.2 mg/dL Final  . GFR calc non Af Amer 10/29/2019 >60  >60 mL/min Final  . GFR calc Af Amer 10/29/2019 >60  >60 mL/min Final  . Anion gap 10/29/2019 9  5 - 15 Final   Performed at [facility],  [address], [place], [place] [address]  . WBC 10/29/2019 2.5* 4.0 - 10.5 K/uL Final  . RBC 10/29/2019 3.89* 4.22 - 5.81 MIL/uL Final  . Hemoglobin 10/29/2019 12.2* 13.0 - 17.0 g/dL Final  . HCT 10/29/2019 37.0* 39 - 52 % Final  . MCV 10/29/2019 95.1  80.0 - 100.0 fL Final  . MCH 10/29/2019 31.4  26.0 - 34.0 pg Final  . MCHC 10/29/2019 33.0  30.0 - 36.0 g/dL Final  . RDW 10/29/2019 14.7  11.5 - 15.5 % Final  . Platelets 10/29/2019 134* 150 - 400 K/uL Final  . nRBC 10/29/2019 0.0  0.0 - 0.2 % Final  . Neutrophils Relative % 10/29/2019 19  % Final  . Neutro Abs 10/29/2019 0.5* 1.7 - 7.7 K/uL Final  . Lymphocytes Relative 10/29/2019 67  % Final  . Lymphs Abs 10/29/2019 1.7  0.7 - 4.0 K/uL Final  . Monocytes Relative 10/29/2019 12  % Final  . Monocytes Absolute 10/29/2019 0.3  0 - 1 K/uL Final  . Eosinophils Relative 10/29/2019 1  % Final  . Eosinophils Absolute 10/29/2019 0.0  0 - 0 K/uL Final  . Basophils Relative 10/29/2019 1  % Final  . Basophils Absolute 10/29/2019 0.0  0 - 0 K/uL Final  . Immature Granulocytes 10/29/2019 0  % Final  . Abs Immature Granulocytes 10/29/2019 0.01  0.00 - 0.07 K/uL Final   Performed at [facility], [address], [place], [place] [address]    Assessment:  MITCHEAL SWEETIN is a 79 y.o. male  withmetastatic colon cancer. He was diagnosed with stage I colon cancers/p transverse colectomy on 10/17/2013. 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 Tumor extended into the submucosa. Margins were negative. There was lymphovascular invasion. 14 lymph nodes were negative.Pathologic stagewas T1 N0.  Colonoscopyon 10/02/2014 noted several 3 mm polyps and an 8 mm polyp in the cecum and transverse colon. Pathology revealed tubular adenomas negative for high-grade dysplasia and malignancy. Colonoscopyon 02/16/2017 revealed 2 diminutive polyps in  the descending colon. Pathology revealed tubular adenomas without dysplasia or malignancy.  CEAhas been followed: 3.1 on 04/27/2014, 2.3 on 11/11/2014, 3.0 on 05/12/2015, 2.7 on 11/10/2015, 3.2 on 05/10/2016, 2.8 on 11/01/2016, 3.3 on 04/25/2017, 3.1 on 10/31/2017, 4.0 on 05/01/2018, 7.0on 11/28/2018, 7.4 on 12/13/2018, 10.5 on 03/25/2019, 17.8on 06/25/2019, 16.2 on 07/09/2019, 13.8 on 07/23/2019, 11.9 on 07/30/2019, 6.1 on 08/27/2019, 6.1 on 09/10/2019, 6.4 on 10/01/2019, 6.9 on 10/15/2019, and 7.7 on 10/29/2019.  Chest, abdomen and pelvis CTon 12/20/2018 revealed postoperative findings of transverse colon resection without evidence of recurrent mass, definite lymphadenopathy, or distant metastatic disease to explain rising CEA. Therewereprominent sub-centimeter retroperitoneal and iliac lymph nodes, uncertain significance. There were postoperative findings about the left renal hilum, of uncertain nature, with a broad-based postoperative fat containing left-sided lumbar hernia.There was bibasilar bronchiectasis and scarring with multiple post infectious or inflammatory pneumatoceles, unchanged in comparison to CT date 06/23/2011.  PET scanon 04/02/2019 revealed a2.4 x 2.3 cm (SUV 11)hypermetabolic soft tissue density caudal and anterior to the pancreatic neckfavored to represent isolated peritoneal or nodal metastasis in the setting of prior transverse colonic  resection (expected primary drainage). Although this was immediately adjacent to the pancreas, a fat plane was maintained, arguing strongly against a pancreatic primary. Otherwise,there wasno evidence of hypermetabolic metastasis.  Upper endoscopic ultrasoundon 04/17/2019 revealeda normal esophagus, stomach, duodenum, and pancreas.There was a 2.4 x 2.4 cm irregularmassin the retroperitoneum adjacent to, but not involving the pancreatic neck. FNA and core needle biopsy were performed. Pathologyrevealed adenocarcinoma  compatible with a metastatic lesion of colorectal origin. Tumor cells were positive for CK20 and CDX2andnegative for CK7.  Omniseqon 05/15/2019 revealed + KRAS and TP53. Negative results included BRAF V600E, Her2, NRAS, NTRK, PD-L1 (<1%), and TMB 8.7/Mb (intermediate). MMR testingfrom his colon resection on 10/16/2013 was intact with a low probability of MSI-H.  He is s/p8cyclesof FOLFOXchemotherapy (07/09/2019- 08/27/2019; 09/17/2019 - 10/15/2019).He received Neulasta after cycle #4 and #5 secondary to progressive leukopenia.  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  He received Janey Genta on day 8 of cycle #8.  Abdomen and pelvis CT on 10/09/2019 revealed a 2.4 x 2.3 cm lobulated nodule anterior to the pancreatic neck, previously FDG PET avid, not changed in size although decreased in internal attenuation suggesting treatment response. There was no evidence of new metastatic disease in the abdomen or pelvis.  There were unchanged prominent subcentimeter retroperitoneal and celiac axis lymph nodes, not previously FDG avid and nonspecific. There were postoperative findings about the left renal hilum with a broad-based left-sided lumbar hernia and hepatic steatosis.  He has chronic right lower extremity edema. Right lower extremity duplexon 12/24/2018 revealed no DVT. He has a small right Baker's cyst.Bilateral lower extremityduplexon 08/16/2019 revealed no evidence of deep venous thrombosis in either lower extremity. Increased pulsatility of the venous waveforms bilaterally. These findings suggestedelevated right heart pressures. Differential considerations includedtricuspid regurgitation, right heart failure, pulmonary arterial hypertension and chronic COPD.  Hewas admitted to [facility] 08/16/2019 -08/17/2019 with right lower extremitycellulitis.He was unable to bear weight. Hewas treated withIV fluids, NSAIDs, colchicine,and broad antibiotics (vancomycin and  Cefepime).He was discharged onindomethacin x 5 daysand Keflex 500 mg TID x 5 days.  He has received his COVID-19 vaccine.  Symptomatically, he feels "pretty good".  Exam is stable.  ANC is 500.  Plan: 1.   Labs today: CBC with diff, CMP, Mg, CEA. 2. Metastaticcolon cancer Clinically, he is doing well. He initially presented with stage I disease s/p resection. PET scan on 04/02/2019 revealeda2.4 x 2.3 cm (SUV 11)soft tissue density caudal and anterior to the pancreatic neck. 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 Lesion was not resectable. NGS revealed no targetable mutation. MMR on original resection revealed a a low probability of MSI-H. He declined Avastin. He is s/p 8cycles of FOLFOX chemotherapy.   He has required growth factor support.   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   He requires periodic Zarxio to maintain counts. CEA has decreased from 17.8 to 6.9.  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   Oxaliplatin has been decreased to 65 mg/m2. Labs reviewed.  Patient neutropenic.No chemotherapy today.  Discuss symptom management.  He has antiemetics at home to use on a prn bases.  Interventions are adequate.    3. Chemotherapy-induced neutropenia  WBC 2500 with an ANC of 500.  Zarxio today.  Neutropenic precautions reviewed. 4.   Hypomagnesemia   Magnesium 1.6.  Magnesium 2 gm IV today. 5.   RTC tomorrow for labs (CBC with diff) +/- Zarxio. 6.   RTC on 11/03/2019 for MD assessment, labs (CBC  with diff, CMP, Mg), and cycle #9 FOLFOX.  I discussed the assessment and treatment plan with the patient.  The patient was provided an opportunity to ask questions and all were answered.  The patient agreed with the plan and demonstrated an understanding of the  instructions.  The patient was advised to call back if the symptoms worsen or if the condition fails to improve as anticipated.   Lequita Asal, MD, PhD    10/29/2019, 9:42 AM  I, Selena Batten, am acting as scribe for [employer]. Mike Gip, MD, PhD.  I, Reagen Goates C. Mike Gip, MD, have reviewed the above documentation for accuracy and completeness, and I agree with the above.

## 2019-10-29 ENCOUNTER — Encounter: Payer: Self-pay | Admitting: Hematology and Oncology

## 2019-10-29 ENCOUNTER — Inpatient Hospital Stay: Payer: Medicare Other

## 2019-10-29 ENCOUNTER — Other Ambulatory Visit: Payer: Self-pay

## 2019-10-29 ENCOUNTER — Inpatient Hospital Stay (HOSPITAL_BASED_OUTPATIENT_CLINIC_OR_DEPARTMENT_OTHER): Payer: Medicare Other | Admitting: Hematology and Oncology

## 2019-10-29 VITALS — BP 144/75 | HR 88 | Temp 97.3°F | Resp 16 | Wt 266.0 lb

## 2019-10-29 VITALS — BP 129/76 | HR 77 | Temp 97.6°F | Resp 18

## 2019-10-29 DIAGNOSIS — G608 Other hereditary and idiopathic neuropathies: Secondary | ICD-10-CM | POA: Diagnosis not present

## 2019-10-29 DIAGNOSIS — C786 Secondary malignant neoplasm of retroperitoneum and peritoneum: Secondary | ICD-10-CM | POA: Diagnosis not present

## 2019-10-29 DIAGNOSIS — C184 Malignant neoplasm of transverse colon: Secondary | ICD-10-CM

## 2019-10-29 DIAGNOSIS — Z5111 Encounter for antineoplastic chemotherapy: Secondary | ICD-10-CM

## 2019-10-29 LAB — CBC WITH DIFFERENTIAL/PLATELET
Abs Immature Granulocytes: 0.01 10*3/uL (ref 0.00–0.07)
Basophils Absolute: 0 10*3/uL (ref 0.0–0.1)
Basophils Relative: 1 %
Eosinophils Absolute: 0 10*3/uL (ref 0.0–0.5)
Eosinophils Relative: 1 %
HCT: 37 % — ABNORMAL LOW (ref 39.0–52.0)
Hemoglobin: 12.2 g/dL — ABNORMAL LOW (ref 13.0–17.0)
Immature Granulocytes: 0 %
Lymphocytes Relative: 67 %
Lymphs Abs: 1.7 10*3/uL (ref 0.7–4.0)
MCH: 31.4 pg (ref 26.0–34.0)
MCHC: 33 g/dL (ref 30.0–36.0)
MCV: 95.1 fL (ref 80.0–100.0)
Monocytes Absolute: 0.3 10*3/uL (ref 0.1–1.0)
Monocytes Relative: 12 %
Neutro Abs: 0.5 10*3/uL — ABNORMAL LOW (ref 1.7–7.7)
Neutrophils Relative %: 19 %
Platelets: 134 10*3/uL — ABNORMAL LOW (ref 150–400)
RBC: 3.89 MIL/uL — ABNORMAL LOW (ref 4.22–5.81)
RDW: 14.7 % (ref 11.5–15.5)
WBC: 2.5 10*3/uL — ABNORMAL LOW (ref 4.0–10.5)
nRBC: 0 % (ref 0.0–0.2)

## 2019-10-29 LAB — COMPREHENSIVE METABOLIC PANEL
ALT: 47 U/L — ABNORMAL HIGH (ref 0–44)
AST: 57 U/L — ABNORMAL HIGH (ref 15–41)
Albumin: 3.5 g/dL (ref 3.5–5.0)
Alkaline Phosphatase: 60 U/L (ref 38–126)
Anion gap: 9 (ref 5–15)
BUN: 10 mg/dL (ref 8–23)
CO2: 23 mmol/L (ref 22–32)
Calcium: 9.3 mg/dL (ref 8.9–10.3)
Chloride: 105 mmol/L (ref 98–111)
Creatinine, Ser: 0.87 mg/dL (ref 0.61–1.24)
GFR calc Af Amer: >60 mL/min (ref >60)
GFR calc non Af Amer: >60 mL/min (ref >60)
Glucose, Bld: 226 mg/dL — ABNORMAL HIGH (ref 70–99)
Potassium: 3.6 mmol/L (ref 3.5–5.1)
Sodium: 137 mmol/L (ref 135–145)
Total Bilirubin: 0.8 mg/dL (ref 0.3–1.2)
Total Protein: 7.1 g/dL (ref 6.5–8.1)

## 2019-10-29 LAB — MAGNESIUM: Magnesium: 1.6 mg/dL — ABNORMAL LOW (ref 1.7–2.4)

## 2019-10-29 MED ORDER — FILGRASTIM-SNDZ 480 MCG/0.8ML IJ SOSY
480.0000 ug | PREFILLED_SYRINGE | Freq: Once | INTRAMUSCULAR | Status: AC
  Administered 2019-10-29: 480 ug via SUBCUTANEOUS

## 2019-10-29 MED ORDER — HEPARIN SOD (PORK) LOCK FLUSH 100 UNIT/ML IV SOLN
500.0000 [IU] | Freq: Once | INTRAVENOUS | Status: AC | PRN
  Administered 2019-10-29: 500 [IU]
  Filled 2019-10-29: qty 5

## 2019-10-29 MED ORDER — SODIUM CHLORIDE 0.9% FLUSH
10.0000 mL | INTRAVENOUS | Status: DC | PRN
  Administered 2019-10-29: 10 mL via INTRAVENOUS
  Filled 2019-10-29: qty 10

## 2019-10-29 MED ORDER — SODIUM CHLORIDE 0.9 % IV SOLN
Freq: Once | INTRAVENOUS | Status: AC
  Filled 2019-10-29: qty 250

## 2019-10-29 MED ORDER — MAGNESIUM SULFATE 2 GM/50ML IV SOLN
2.0000 g | Freq: Once | INTRAVENOUS | Status: AC
  Administered 2019-10-29: 2 g via INTRAVENOUS

## 2019-10-29 NOTE — Patient Instructions (Signed)
Magnesium Sulfate injection What is this medicine? MAGNESIUM SULFATE (mag NEE zee um SUL fate) is an electrolyte injection commonly used to treat low magnesium levels in your blood. It is also used to prevent or control seizures in women with preeclampsia or eclampsia. This medicine may be used for other purposes; ask your health care provider or pharmacist if you have questions. What should I tell my health care provider before I take this medicine? They need to know if you have any of these conditions:  heart disease  history of irregular heart beat  kidney disease  an unusual or allergic reaction to magnesium sulfate, medicines, foods, dyes, or preservatives  pregnant or trying to get pregnant  breast-feeding How should I use this medicine? This medicine is for infusion into a vein. It is given by a health care professional in a hospital or clinic setting. Talk to your pediatrician regarding the use of this medicine in children. While this drug may be prescribed for selected conditions, precautions do apply. Overdosage: If you think you have taken too much of this medicine contact a poison control center or emergency room at once. NOTE: This medicine is only for you. Do not share this medicine with others. What if I miss a dose? This does not apply. What may interact with this medicine? This medicine may interact with the following medications:  certain medicines for anxiety or sleep  certain medicines for seizures like phenobarbital  digoxin  medicines that relax muscles for surgery  narcotic medicines for pain This list may not describe all possible interactions. Give your health care provider a list of all the medicines, herbs, non-prescription drugs, or dietary supplements you use. Also tell them if you smoke, drink alcohol, or use illegal drugs. Some items may interact with your medicine. What should I watch for while using this medicine? Your condition will be  monitored carefully while you are receiving this medicine. You may need blood work done while you are receiving this medicine. What side effects may I notice from receiving this medicine? Side effects that you should report to your doctor or health care professional as soon as possible:  allergic reactions like skin rash, itching or hives, swelling of the face, lips, or tongue  facial flushing  muscle weakness  signs and symptoms of low blood pressure like dizziness; feeling faint or lightheaded, falls; unusually weak or tired  signs and symptoms of a dangerous change in heartbeat or heart rhythm like chest pain; dizziness; fast or irregular heartbeat; palpitations; breathing problems  sweating This list may not describe all possible side effects. Call your doctor for medical advice about side effects. You may report side effects to FDA at 1-800-FDA-1088. Where should I keep my medicine? This drug is given in a hospital or clinic and will not be stored at home. NOTE: This sheet is a summary. It may not cover all possible information. If you have questions about this medicine, talk to your doctor, pharmacist, or health care provider.  2020 Elsevier/Gold Standard (2015-11-17 12:31:42)  

## 2019-10-29 NOTE — Progress Notes (Signed)
Patient states that his toes and cold neuropathy are stable at this time. No other complaints or questions.

## 2019-10-30 ENCOUNTER — Inpatient Hospital Stay: Payer: Medicare Other

## 2019-10-30 ENCOUNTER — Ambulatory Visit: Payer: Medicare Other

## 2019-10-30 VITALS — BP 152/80 | HR 78 | Temp 97.7°F | Resp 18

## 2019-10-30 DIAGNOSIS — C786 Secondary malignant neoplasm of retroperitoneum and peritoneum: Secondary | ICD-10-CM

## 2019-10-30 DIAGNOSIS — Z5111 Encounter for antineoplastic chemotherapy: Secondary | ICD-10-CM

## 2019-10-30 DIAGNOSIS — C184 Malignant neoplasm of transverse colon: Secondary | ICD-10-CM

## 2019-10-30 DIAGNOSIS — G608 Other hereditary and idiopathic neuropathies: Secondary | ICD-10-CM

## 2019-10-30 LAB — CBC WITH DIFFERENTIAL/PLATELET
Abs Immature Granulocytes: 0.28 10*3/uL — ABNORMAL HIGH (ref 0.00–0.07)
Basophils Absolute: 0 10*3/uL (ref 0.0–0.1)
Basophils Relative: 1 %
Eosinophils Absolute: 0 10*3/uL (ref 0.0–0.5)
Eosinophils Relative: 1 %
HCT: 39.1 % (ref 39.0–52.0)
Hemoglobin: 12.9 g/dL — ABNORMAL LOW (ref 13.0–17.0)
Immature Granulocytes: 4 %
Lymphocytes Relative: 33 %
Lymphs Abs: 2.3 10*3/uL (ref 0.7–4.0)
MCH: 31.4 pg (ref 26.0–34.0)
MCHC: 33 g/dL (ref 30.0–36.0)
MCV: 95.1 fL (ref 80.0–100.0)
Monocytes Absolute: 1 10*3/uL (ref 0.1–1.0)
Monocytes Relative: 13 %
Neutro Abs: 3.5 10*3/uL (ref 1.7–7.7)
Neutrophils Relative %: 48 %
Platelets: 159 10*3/uL (ref 150–400)
RBC: 4.11 MIL/uL — ABNORMAL LOW (ref 4.22–5.81)
RDW: 14.7 % (ref 11.5–15.5)
WBC: 7.1 10*3/uL (ref 4.0–10.5)
nRBC: 0 % (ref 0.0–0.2)

## 2019-10-30 LAB — CEA: CEA: 7.7 ng/mL — ABNORMAL HIGH (ref 0.0–4.7)

## 2019-10-30 MED ORDER — FILGRASTIM-SNDZ 480 MCG/0.8ML IJ SOSY
480.0000 ug | PREFILLED_SYRINGE | Freq: Once | INTRAMUSCULAR | Status: AC
  Administered 2019-10-30: 480 ug via SUBCUTANEOUS
  Filled 2019-10-30: qty 0.8

## 2019-10-31 ENCOUNTER — Inpatient Hospital Stay: Payer: Medicare Other

## 2019-10-31 NOTE — Progress Notes (Signed)
[facility]  [address], [address] [place], [place] [address] Phone: [phone]  Fax: [phone]   Clinic Day:  11/03/2019  Referring physician: Tracie Harrier, MD  Chief Complaint: Jared Gutierrez is a 79 y.o. male with metastaticcolon cancerwho is seen for assessment prior to cycle #9 FOLFOX chemotherapy.   HPI: The patient was last seen in the medical onoclogy clinic on 10/29/2019. At that time, he felt "pretty good".  Hematocrit was 37.0, hemoglobin 12.2, platelets 134,000, WBC 2,500 (ANC 500). AST was 57 and ALT 47. CEA was 7.7.  Magnesium was 1.6.  Chemotherapy was held secondary to neutropenia.  He received Zarxio 480 mcg SQ and magnesium sulfate 2 gm IV.   CBC revealed hematocrit 39.1, hemoglobin 12.9, platelets 159,000, WBC 7,100.   During the interim, patients notes pain and swelling in his feet and ankles x 3 days. Episode is similar to prior episodes s/p Neulasta.  His wife denies any fevers but notes some redness. He is in a wheelchair today for safety due to swelling and pain. He has mild tingling in his fingers when the air is cold or he touches something cold. He has no issues with buttoning buttons or dropping objects. After his last treatment, Jared Gutierrez reports it took him 7 days to recover.  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    Past Medical History:  Diagnosis Date  . Arthritis    OSTEOARTHRITIS  . Cancer ([place])   . Cavitary lesion of lung    RIGHT LOWER LOBE  . Chicken pox   . Colon cancer ([place])   . History of kidney stones   . Hypertension   . Lipoma of colon   . Nephrolithiasis   . Nephrolithiasis   . Obesity   . Shingles   . Tubular adenoma of colon    multiple fragments    Past Surgical History:  Procedure Laterality Date  . COLON SURGERY    . COLONOSCOPY N/A 10/02/2014   Procedure: COLONOSCOPY;  Surgeon: Josefine Class, MD;  Location: [facility] ENDOSCOPY;  Service: Endoscopy;  Laterality: N/A;  .  COLONOSCOPY WITH PROPOFOL N/A 02/16/2017   Procedure: COLONOSCOPY WITH PROPOFOL;  Surgeon: Manya Silvas, MD;  Location: [facility] ENDOSCOPY;  Service: Endoscopy;  Laterality: N/A;  . EUS N/A 04/17/2019   Procedure: FULL UPPER ENDOSCOPIC ULTRASOUND (EUS) RADIAL;  Surgeon: Holly Bodily, MD;  Location: [facility] ENDOSCOPY;  Service: Gastroenterology;  Laterality: N/A;  . KIDNEY STONE SURGERY    . PARTIAL COLECTOMY  10/17/2013  . PORTACATH PLACEMENT Right 06/13/2019   Procedure: INSERTION PORT-A-CATH;  Surgeon: Benjamine Sprague, DO;  Location: [facility] ORS;  Service: General;  Laterality: Right;    Family History  Problem Relation Age of Onset  . Cancer Mother   . Breast cancer Mother   . COPD Father     Social History:  reports that he has never smoked. He has never used smokeless tobacco. He reports current alcohol use. He reports that he does not use drugs. 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He is married and his wife's name is Jared Gutierrez [phone]).The patient is accompaied by Jared Gutierrez today.  Allergies: No Known Allergies  Current Medications: Current Outpatient Medications  Medication Sig Dispense Refill  . aspirin EC 81 MG tablet Take 81 mg by mouth daily.    . cephALEXin (KEFLEX) 500 MG capsule Take 500 mg by mouth 3 (three) times daily.    . Cholecalciferol (  VITAMIN D) 125 MCG (5000 UT) CAPS Take 5,000 mg by mouth.    . docusate sodium ([employer] STOOL SOFTENER) 100 MG capsule Take 100 mg by mouth 2 (two) times daily.    [name] HYDROcodone-acetaminophen (NORCO/VICODIN) 5-325 MG tablet Take 1 tablet by mouth every 6 (six) hours as needed for moderate pain (up to 3 doses for moderate pain.).     [name] ibuprofen (ADVIL) 800 MG tablet Take 800 mg by mouth every 8 (eight) hours as needed.    . lidocaine-prilocaine (EMLA) cream Apply to affected area once 30 g 3  . loratadine (CLARITIN) 10 MG tablet Take 10 mg by mouth daily.    [name] olmesartan (BENICAR) 20 MG tablet Take 20 mg  by mouth daily.    . ondansetron (ZOFRAN) 8 MG tablet Take 1 tablet (8 mg total) by mouth 2 (two) times daily as needed for refractory nausea / vomiting. Start on day 3 after chemotherapy. 30 tablet 1  . potassium chloride SA (KLOR-CON M20) 20 MEQ tablet Take by mouth.    . Probiotic Product (PROBIOTIC DAILY PO) Take 1 tablet by mouth daily.     No current facility-administered medications for this visit.   Facility-Administered Medications Ordered in Other Visits  Medication Dose Route Frequency Provider Last Rate Last Admin  . 0.9 %  sodium chloride infusion   Intravenous Once Sigourney Portillo C, MD      . heparin lock flush 100 unit/mL  500 Units Intravenous Once Zakayla Martinec C, MD      . sodium chloride flush (NS) 0.9 % injection 10 mL  10 mL Intracatheter PRN Nolon Stalls C, MD   10 mL at 10/03/19 1354  . sodium chloride flush (NS) 0.9 % injection 10 mL  10 mL Intravenous PRN Lequita Asal, MD   10 mL at 11/03/19 9678    Review of Systems  Constitutional: Negative for chills, diaphoresis, fever, malaise/fatigue and weight loss (up 3 lbs).  HENT: Negative for congestion and sore throat.   Eyes: Negative for blurred vision and double vision.  Respiratory: Negative for cough and shortness of breath.   Cardiovascular: Positive for leg swelling (feet, ankles, mild right foot, moderate left foot). Negative for chest pain and palpitations.  Gastrointestinal: Positive for nausea (well managed with ondanestron). Negative for abdominal pain, constipation, diarrhea, heartburn and vomiting.  Genitourinary: Negative for frequency and urgency.  Musculoskeletal: Positive for joint pain (chronic bilateral knee pain). Negative for back pain, falls and myalgias.  Skin: Negative for itching and rash.       Feet and ankle redness.  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 Negative for dizziness, weakness and headaches.        Ambulating with wheelchair.  Endo/Heme/Allergies: Does not bruise/bleed easily.  Psychiatric/Behavioral: Negative for depression. The patient is not nervous/anxious.   All other systems reviewed and are negative.  Performance status (ECOG): 1-2  Vitals Blood pressure (!) 161/69, pulse 96, temperature (!) 97.2 F (36.2 C), temperature source Tympanic, resp. rate 16, weight 268 lb 15.4 oz (122 kg), SpO2 97 %.   Physical Exam Vitals and nursing note reviewed.  Constitutional:      General: He is not in acute distress.    Appearance: Normal appearance. He is well-developed. He is not diaphoretic.     Interventions: Face mask in place.     Comments: Patient examined in wheelchair.  HENT:     Head: Normocephalic and atraumatic.  Mouth/Throat:     Mouth: Mucous membranes are moist. No oral lesions.     Pharynx: Oropharynx is clear. No oropharyngeal exudate.  Eyes:     General: No scleral icterus.       Right eye: No discharge.        Left eye: No discharge.     Conjunctiva/sclera: Conjunctivae normal.     Pupils: Pupils are equal, round, and reactive to light.  Neck:     Vascular: No JVD.  Cardiovascular:     Rate and Rhythm: Normal rate and regular rhythm.     Heart sounds: Normal heart sounds. No murmur heard.  No friction rub. No gallop.   Pulmonary:     Effort: Pulmonary effort is normal. No respiratory distress.     Breath sounds: Normal breath sounds. No wheezing, rhonchi or rales.  Chest:     Chest wall: No tenderness.  Abdominal:     General: Bowel sounds are normal. There is no distension.     Palpations: There is no mass.     Tenderness: There is no abdominal tenderness. There is no guarding or rebound.  Musculoskeletal:        General: No tenderness. Normal range of motion.     Cervical back: Normal range of motion and neck supple.     Right lower leg: Edema present.     Left lower leg: Edema present.  Lymphadenopathy:     Head:     Right side of head: No  preauricular, posterior auricular or occipital adenopathy.     Left side of head: No preauricular, posterior auricular or occipital adenopathy.     Cervical: No cervical adenopathy.     Right cervical: No superficial cervical adenopathy.    Left cervical: No superficial cervical adenopathy.     Upper Body:     Right upper body: No supraclavicular or axillary adenopathy.     Left upper body: No supraclavicular or axillary adenopathy.     Lower Body: No right inguinal adenopathy. No left inguinal adenopathy.  Skin:    General: Skin is warm and dry.     Coloration: Skin is not pale.     Findings: No bruising, erythema (feet (L>R)), lesion or rash.     Comments: Left foot warmer with swelling and tenderness in great toe. Edema across dorsum of foot.  Neurological:     Mental Status: He is alert and oriented to person, place, and time.  Psychiatric:        Mood and Affect: Mood normal.        Behavior: Behavior normal.        Thought Content: Thought content normal.        Judgment: Judgment normal.        Infusion on 11/03/2019  Component Date Value Ref Range Status  . Magnesium 11/03/2019 1.4* 1.7 - 2.4 mg/dL Final   Performed at [facility], [address], [place], [place] [address]  . Sodium 11/03/2019 137  135 - 145 mmol/L Final  . Potassium 11/03/2019 3.8  3.5 - 5.1 mmol/L Final  . Chloride 11/03/2019 104  98 - 111 mmol/L Final  . CO2 11/03/2019 24  22 - 32 mmol/L Final  . Glucose, Bld 11/03/2019 180* 70 - 99 mg/dL Final   Glucose reference range applies only to samples taken after fasting for at least 8 hours.  . BUN 11/03/2019 8  8 - 23 mg/dL Final  . Creatinine, Ser 11/03/2019 0.85  0.61 - 1.24 mg/dL Final  . Calcium 11/03/2019 9.2  8.9 - 10.3 mg/dL Final  . Total Protein 11/03/2019 7.6  6.5 - 8.1 g/dL Final  . Albumin 11/03/2019 3.6  3.5 - 5.0 g/dL Final  . AST 11/03/2019 42* 15 - 41 U/L Final  . ALT 11/03/2019 29  0 - 44 U/L Final  . Alkaline  Phosphatase 11/03/2019 75  38 - 126 U/L Final  . Total Bilirubin 11/03/2019 0.4  0.3 - 1.2 mg/dL Final  . GFR calc non Af Amer 11/03/2019 >60  >60 mL/min Final  . GFR calc Af Amer 11/03/2019 >60  >60 mL/min Final  . Anion gap 11/03/2019 9  5 - 15 Final   Performed at [facility], [address] [place], [place] [address]  . WBC 11/03/2019 11.5* 4.0 - 10.5 K/uL Final  . RBC 11/03/2019 3.97* 4.22 - 5.81 MIL/uL Final  . Hemoglobin 11/03/2019 12.2* 13.0 - 17.0 g/dL Final  . HCT 11/03/2019 37.4* 39 - 52 % Final  . MCV 11/03/2019 94.2  80.0 - 100.0 fL Final  . MCH 11/03/2019 30.7  26.0 - 34.0 pg Final  . MCHC 11/03/2019 32.6  30.0 - 36.0 g/dL Final  . RDW 11/03/2019 14.8  11.5 - 15.5 % Final  . Platelets 11/03/2019 178  150 - 400 K/uL Final  . nRBC 11/03/2019 0.5* 0.0 - 0.2 % Final  . Neutrophils Relative % 11/03/2019 53  % Final  . Neutro Abs 11/03/2019 6.3  1.7 - 7.7 K/uL Final  . Lymphocytes Relative 11/03/2019 23  % Final  . Lymphs Abs 11/03/2019 2.6  0.7 - 4.0 K/uL Final  . Monocytes Relative 11/03/2019 10  % Final  . Monocytes Absolute 11/03/2019 1.1* 0 - 1 K/uL Final  . Eosinophils Relative 11/03/2019 1  % Final  . Eosinophils Absolute 11/03/2019 0.1  0 - 0 K/uL Final  . Basophils Relative 11/03/2019 1  % Final  . Basophils Absolute 11/03/2019 0.1  0 - 0 K/uL Final  . WBC Morphology 11/03/2019 MILD LEFT SHIFT (1-5% METAS, OCC MYELO, OCC BANDS)   Final  . RBC Morphology 11/03/2019 MORPHOLOGY UNREMARKABLE   Final  . Smear Review 11/03/2019 Normal platelet morphology   Final  . Immature Granulocytes 11/03/2019 12  % Final  . Abs Immature Granulocytes 11/03/2019 1.41* 0.00 - 0.07 K/uL Final   Performed at [facility], [address], [place],  [address]    Assessment:  ARDEN TINOCO is a 79 y.o. male  withmetastatic colon cancer. He was diagnosed with stage I colon cancers/p transverse colectomy on 10/17/2013. Pathologyrevealed a 1 cm  moderately differentiated invasive adenocarcinoma arising in a 4.6 cm tubulovillous adenoma with high-grade dysplasia. Tumor extended into the submucosa. Margins were negative. There was lymphovascular invasion. 14 lymph nodes were negative.Pathologic stagewas T1 N0.  Colonoscopyon 10/02/2014 noted several 3 mm polyps and an 8 mm polyp in the cecum and transverse colon. Pathology revealed tubular adenomas negative for high-grade dysplasia and malignancy. Colonoscopyon 02/16/2017 revealed 2 diminutive polyps in the descending colon. Pathology revealed tubular adenomas without dysplasia or malignancy.  CEAhas been followed: 3.1 on 04/27/2014, 2.3 on 11/11/2014, 3.0 on 05/12/2015, 2.7 on 11/10/2015, 3.2 on 05/10/2016, 2.8 on 11/01/2016, 3.3 on 04/25/2017, 3.1 on 10/31/2017, 4.0 on 05/01/2018, 7.0on 11/28/2018, 7.4 on 12/13/2018, 10.5 on 03/25/2019, 17.8on 06/25/2019, 16.2 on 07/09/2019, 13.8 on 07/23/2019, 11.9 on 07/30/2019, 6.1 on 08/27/2019, 6.1 on 09/10/2019, 6.4 on 10/01/2019, 6.9 on 10/15/2019, and 7.7 on  10/29/2019.  Chest, abdomen and pelvis CTon 12/20/2018 revealed postoperative findings of transverse colon resection without evidence of recurrent mass, definite lymphadenopathy, or distant metastatic disease to explain rising CEA. Therewereprominent sub-centimeter retroperitoneal and iliac lymph nodes, uncertain significance. There were postoperative findings about the left renal hilum, of uncertain nature, with a broad-based postoperative fat containing left-sided lumbar hernia.There was bibasilar bronchiectasis and scarring with multiple post infectious or inflammatory pneumatoceles, unchanged in comparison to CT date 06/23/2011.  PET scanon 04/02/2019 revealed a2.4 x 2.3 cm (SUV 11)hypermetabolic soft tissue density caudal and anterior to the pancreatic neckfavored to represent isolated peritoneal or nodal metastasis in the setting of prior transverse colonic resection  (expected primary drainage). Although this was immediately adjacent to the pancreas, a fat plane was maintained, arguing strongly against a pancreatic primary. Otherwise,there wasno evidence of hypermetabolic metastasis.  Upper endoscopic ultrasoundon 04/17/2019 revealeda normal esophagus, stomach, duodenum, and pancreas.There was a 2.4 x 2.4 cm irregularmassin the retroperitoneum adjacent to, but not involving the pancreatic neck. FNA and core needle biopsy were performed. Pathologyrevealed adenocarcinoma compatible with a metastatic lesion of colorectal origin. Tumor cells were positive for CK20 and CDX2andnegative for CK7.  Omniseqon 05/15/2019 revealed + KRAS and TP53. Negative results included BRAF V600E, Her2, NRAS, NTRK, PD-L1 (<1%), and TMB 8.7/Mb (intermediate). MMR testingfrom his colon resection on 10/16/2013 was intact with a low probability of MSI-H.  He is s/p8cyclesof FOLFOXchemotherapy (07/09/2019- 08/27/2019; 09/17/2019 - 10/15/2019).He received Neulasta after cycle #4 and #5 secondary to progressive leukopenia.  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  He received Janey Genta on day 8 and 9 of cycle #8.  He developed pseudogout.  Abdomen and pelvis CT on 10/09/2019 revealed a 2.4 x 2.3 cm lobulated nodule anterior to the pancreatic neck, previously FDG PET avid, not changed in size although decreased in internal attenuation suggesting treatment response. There was no evidence of new metastatic disease in the abdomen or pelvis.  There were unchanged prominent subcentimeter retroperitoneal and celiac axis lymph nodes, not previously FDG avid and nonspecific. There were postoperative findings about the left renal hilum with a broad-based left-sided lumbar hernia and hepatic steatosis.  He has chronic right lower extremity edema. Right lower extremity duplexon 12/24/2018 revealed no DVT. He has a small right Baker's cyst.Bilateral lower extremityduplexon  08/16/2019 revealed no evidence of deep venous thrombosis in either lower extremity. Increased pulsatility of the venous waveforms bilaterally. These findings suggestedelevated right heart pressures. Differential considerations includedtricuspid regurgitation, right heart failure, pulmonary arterial hypertension and chronic COPD.  Hewas admitted to [facility] 08/16/2019 -08/17/2019 with right lower extremitycellulitis.He was unable to bear weight. Hewas treated withIV fluids, NSAIDs, colchicine,and broad antibiotics (vancomycin and Cefepime).He was discharged onindomethacin x 5 daysand Keflex 500 mg TID x 5 days.  He has received his COVID-19 vaccine.  Symptomatically, he has developed pseudogout after Zarxio x 2 days.  Left foot is mildly erythematous and edematous.  It is painful for him to walk.  Plan: 1.   Labs today: CBC with diff, CMP, Mg. 2. Metastaticcolon cancer Clinically, he appears to be doing well. He initially presented with stage I disease s/p resection. PET scan on 04/02/2019 revealeda2.4 x 2.3 cm (SUV 11)soft tissue density caudal and anterior to the pancreatic neck. 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 Lesion was not resectable. NGS revealed no targetable mutation. MMR on original resection revealed a a low probability of MSI-H. He declined Avastin. He is s/p 8cycles of FOLFOX chemotherapy.   He has  required growth factor support to maintain counts.   Neulasta resulted in pseudogout.   He has required Zarxio.   With last cycle, he received Zarxio x 2 (peak WBC 20,800) and he again developed pseudogout. Discuss plans for limited Zarxio in the future to avoid peak levels. Abdomen and pelvis CT on 10/09/2019 revealed a 2.4 x 2.3 cm lobulated nodule anterior to  the pancreatic neck not changed in size but decreased in internal attenuation.   CEA has decreased from 17.8 to 7.7. Hold chemotherapy today.  3. Pseudogout Patient again developed pseudogout s/p growth factor support (Zarxio rather than Neulasta). Pseudogout is less severe, but is causing pain, swelling, and erythema limiting mobility (patient in a wheelchair).  LI:DCVUDT 500 mg po TID x 7 days and Indomethacin50 mg TID x 5 days. 4. Hypomagnesiumia Magnesium 1.4 today.  Magnesium 2 gm IV today. 5.   No chemotherapy today. 6.   RTC on 11/12/2019 for MD assessment, labs (CBC with diff, CMP, Mg) and cycle #9 FOLFOX.  I discussed the assessment and treatment plan with the patient.  The patient was provided an opportunity to ask questions and all were answered.  The patient agreed with the plan and demonstrated an understanding of the instructions.  The patient was advised to call back if the symptoms worsen or if the condition fails to improve as anticipated.   Lequita Asal, MD, PhD    11/03/2019, 10:25 AM  I, Selena Batten, am acting as scribe for [employer]. Mike Gip, MD, PhD.  I, Kathrynne Kulinski C. Mike Gip, MD, have reviewed the above documentation for accuracy and completeness, and I agree with the above.

## 2019-11-03 ENCOUNTER — Other Ambulatory Visit: Payer: Self-pay

## 2019-11-03 ENCOUNTER — Inpatient Hospital Stay (HOSPITAL_BASED_OUTPATIENT_CLINIC_OR_DEPARTMENT_OTHER): Payer: Medicare Other | Admitting: Hematology and Oncology

## 2019-11-03 ENCOUNTER — Encounter: Payer: Self-pay | Admitting: Hematology and Oncology

## 2019-11-03 ENCOUNTER — Inpatient Hospital Stay: Payer: Medicare Other

## 2019-11-03 VITALS — BP 161/69 | HR 96 | Temp 97.2°F | Resp 16 | Wt 269.0 lb

## 2019-11-03 VITALS — BP 136/78 | HR 77 | Temp 99.0°F | Resp 18

## 2019-11-03 DIAGNOSIS — C786 Secondary malignant neoplasm of retroperitoneum and peritoneum: Secondary | ICD-10-CM | POA: Diagnosis not present

## 2019-11-03 DIAGNOSIS — C184 Malignant neoplasm of transverse colon: Secondary | ICD-10-CM

## 2019-11-03 DIAGNOSIS — M11279 Other chondrocalcinosis, unspecified ankle and foot: Secondary | ICD-10-CM

## 2019-11-03 DIAGNOSIS — Z5111 Encounter for antineoplastic chemotherapy: Secondary | ICD-10-CM

## 2019-11-03 DIAGNOSIS — L03116 Cellulitis of left lower limb: Secondary | ICD-10-CM | POA: Diagnosis not present

## 2019-11-03 DIAGNOSIS — G608 Other hereditary and idiopathic neuropathies: Secondary | ICD-10-CM

## 2019-11-03 LAB — CBC WITH DIFFERENTIAL/PLATELET
Abs Immature Granulocytes: 1.41 10*3/uL — ABNORMAL HIGH (ref 0.00–0.07)
Basophils Absolute: 0.1 10*3/uL (ref 0.0–0.1)
Basophils Relative: 1 %
Eosinophils Absolute: 0.1 10*3/uL (ref 0.0–0.5)
Eosinophils Relative: 1 %
HCT: 37.4 % — ABNORMAL LOW (ref 39.0–52.0)
Hemoglobin: 12.2 g/dL — ABNORMAL LOW (ref 13.0–17.0)
Immature Granulocytes: 12 %
Lymphocytes Relative: 23 %
Lymphs Abs: 2.6 10*3/uL (ref 0.7–4.0)
MCH: 30.7 pg (ref 26.0–34.0)
MCHC: 32.6 g/dL (ref 30.0–36.0)
MCV: 94.2 fL (ref 80.0–100.0)
Monocytes Absolute: 1.1 10*3/uL — ABNORMAL HIGH (ref 0.1–1.0)
Monocytes Relative: 10 %
Neutro Abs: 6.3 10*3/uL (ref 1.7–7.7)
Neutrophils Relative %: 53 %
Platelets: 178 10*3/uL (ref 150–400)
RBC: 3.97 MIL/uL — ABNORMAL LOW (ref 4.22–5.81)
RDW: 14.8 % (ref 11.5–15.5)
Smear Review: NORMAL
WBC: 11.5 10*3/uL — ABNORMAL HIGH (ref 4.0–10.5)
nRBC: 0.5 % — ABNORMAL HIGH (ref 0.0–0.2)

## 2019-11-03 LAB — COMPREHENSIVE METABOLIC PANEL
ALT: 29 U/L (ref 0–44)
AST: 42 U/L — ABNORMAL HIGH (ref 15–41)
Albumin: 3.6 g/dL (ref 3.5–5.0)
Alkaline Phosphatase: 75 U/L (ref 38–126)
Anion gap: 9 (ref 5–15)
BUN: 8 mg/dL (ref 8–23)
CO2: 24 mmol/L (ref 22–32)
Calcium: 9.2 mg/dL (ref 8.9–10.3)
Chloride: 104 mmol/L (ref 98–111)
Creatinine, Ser: 0.85 mg/dL (ref 0.61–1.24)
GFR calc Af Amer: >60 mL/min (ref >60)
GFR calc non Af Amer: >60 mL/min (ref >60)
Glucose, Bld: 180 mg/dL — ABNORMAL HIGH (ref 70–99)
Potassium: 3.8 mmol/L (ref 3.5–5.1)
Sodium: 137 mmol/L (ref 135–145)
Total Bilirubin: 0.4 mg/dL (ref 0.3–1.2)
Total Protein: 7.6 g/dL (ref 6.5–8.1)

## 2019-11-03 LAB — MAGNESIUM: Magnesium: 1.4 mg/dL — ABNORMAL LOW (ref 1.7–2.4)

## 2019-11-03 MED ORDER — SODIUM CHLORIDE 0.9% FLUSH
10.0000 mL | INTRAVENOUS | Status: DC | PRN
  Administered 2019-11-03: 10 mL via INTRAVENOUS
  Filled 2019-11-03: qty 10

## 2019-11-03 MED ORDER — INDOMETHACIN 50 MG PO CAPS: 50.0000 mg | ORAL_CAPSULE | Freq: Three times a day (TID) | ORAL | 0 refills | Status: AC

## 2019-11-03 MED ORDER — CEPHALEXIN 500 MG PO CAPS: 500.0000 mg | ORAL_CAPSULE | Freq: Three times a day (TID) | ORAL | 0 refills | Status: AC

## 2019-11-03 MED ORDER — SODIUM CHLORIDE 0.9 % IV SOLN
Freq: Once | INTRAVENOUS | Status: AC
  Filled 2019-11-03: qty 250

## 2019-11-03 MED ORDER — MAGNESIUM SULFATE 2 GM/50ML IV SOLN
2.0000 g | Freq: Once | INTRAVENOUS | Status: AC
  Administered 2019-11-03: 2 g via INTRAVENOUS

## 2019-11-03 MED ORDER — HEPARIN SOD (PORK) LOCK FLUSH 100 UNIT/ML IV SOLN
500.0000 [IU] | Freq: Once | INTRAVENOUS | Status: AC
  Administered 2019-11-03: 500 [IU] via INTRAVENOUS
  Filled 2019-11-03: qty 5

## 2019-11-03 NOTE — Progress Notes (Signed)
Patients swelling is back in feet/ankles. Mild edema of Right foot. Moderate edema in Left foot. Patient started cephalexin 500mg  3x per day for 7 days on Friday 10/31/19. He is in a wheelchair today for safety.

## 2019-11-03 NOTE — Progress Notes (Signed)
Patient here for oncology follow-up appointment, expresses complaints of foot swelling.

## 2019-11-10 NOTE — Progress Notes (Signed)
Jared Gutierrez  [address], [address] [place], [place] [address] Phone: [phone]  Fax: [phone]   Clinic Day:  11/12/2019  Referring physician: Tracie Harrier, MD  Chief Complaint: Jared Gutierrez is a 79 y.o. male with metastaticcolon cancerwho is seen for assessment prior to cycle #9 FOLFOX chemotherapy.  HPI: The patient was last seen in the medical onoclogy clinic on 11/03/2019. At that time, he noted pain and swelling of his left foot secondary Zarxio x 2 days.  Peak WBC was 20,800.  Hematocrit was 37.4, hemoglobin 12.2, MCV 94.2, platelets 178,000, WBC 11,500 (ANC 6,300). Magnesium was 1.4.  He received magnesium sulfate 2 gm IV.  Chemotherapy was held.  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  During the interim, he has been doing better and is back to his normal self.  He denies nausea, vomiting, and diarrhea. 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   Past Medical History:  Diagnosis Date  . Arthritis    OSTEOARTHRITIS  . Cancer ([place])   . Cavitary lesion of lung    RIGHT LOWER LOBE  . Chicken pox   . Colon cancer ([facility])   . History of kidney stones   . Hypertension   . Lipoma of colon   . Nephrolithiasis   . Nephrolithiasis   . Obesity   . Shingles   . Tubular adenoma of colon    multiple fragments    Past Surgical History:  Procedure Laterality Date  . COLON SURGERY    . COLONOSCOPY N/A 10/02/2014   Procedure: COLONOSCOPY;  Surgeon: Jared Class, MD;  Location[facility] END[facility]COPY;  Service: Endoscopy;  Laterality: N/A;  . COLONOSCOPY WITH PROPOFOL N/A 02/16/2017   Procedure: COLONOSCOPY WITH PROPOFOL;  Surgeon: Jared Silvas, MD;  Location: [facility] ENDOSCOPY;  Service: Endoscopy;  Laterality: N/A;  . EUS N/A 04/17/2019   Procedure: FULL UPPER ENDOSCOPIC ULTRASOUND (EUS) RADIAL;  Surgeon: Jared Bodily, MD;  Location: [facility] ENDOSCOPY;  Service: Gastroenterology;  Laterality: N/A;  . KIDNEY STONE SURGERY    . PARTIAL COLECTOMY  10/17/2013  . PORTACATH PLACEMENT Right 06/13/2019   Procedure: INSERTION PORT-A-CATH;  Surgeon: Benjamine Sprague, DO;  Location: [facility] ORS;  Service: General;  Laterality: Right;    Family History  Problem Relation Age of Onset  . Cancer Mother   . Breast cancer Mother   . COPD Father     Social History:  reports that he has never smoked. He has never used smokeless tobacco. He reports current alcohol use. He reports that he does not use drugs. 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He is married and his wife's name is Jared Gutierrez [phone]).The patient is accompaied by his daughter, Jared Gutierrez, on the iPad today.  Allergies: No Known Allergies  Current Medications: Current Outpatient Medications  Medication Sig Dispense Refill  . aspirin EC 81 MG tablet Take 81 mg by mouth daily.    . Cholecalciferol (VITAMIN D) 125 MCG (5000 UT) CAPS Take 5,000 mg by mouth.    . docusate sodium ([employer] STOOL SOFTENER) 100 MG capsule Take 100 mg by mouth 2 (two) times daily.    [name] HYDROcodone-acetaminophen (NORCO/VICODIN) 5-325 MG tablet Take 1 tablet by mouth every 6 (six) hours as needed for moderate pain (up to 3 doses for moderate pain.).     [name]  ibuprofen (ADVIL) 800 MG tablet Take 800 mg by mouth every 8 (eight) hours as needed.    . lidocaine-prilocaine (EMLA) cream Apply to affected area once 30 g 3  . loratadine (CLARITIN) 10 MG tablet Take 10 mg by mouth daily.    [name] olmesartan (BENICAR) 20 MG tablet Take 20 mg by mouth daily.    . ondansetron (ZOFRAN) 8 MG tablet Take 1 tablet (8 mg total) by mouth 2 (two) times daily as needed for refractory nausea / vomiting. Start on day 3 after chemotherapy. 30 tablet 1  . potassium chloride SA (KLOR-CON M20) 20 MEQ tablet Take by mouth.    . Probiotic Product (PROBIOTIC DAILY PO) Take 1 tablet by mouth  daily.    . cephALEXin (KEFLEX) 500 MG capsule Take 500 mg by mouth 3 (three) times daily. (Patient not taking: Reported on 11/12/2019)     No current facility-administered medications for this visit.   Facility-Administered Medications Ordered in Other Visits  Medication Dose Route Frequency Provider Last Rate Last Admin  . 0.9 %  sodium chloride infusion   Intravenous Once Jadalee Westcott C, MD      . sodium chloride flush (NS) 0.9 % injection 10 mL  10 mL Intracatheter PRN Nolon Stalls C, MD   10 mL at 10/03/19 1354    Review of Systems  Constitutional: Negative for chills, diaphoresis, fever, malaise/fatigue and weight loss (up 3 lbs).  HENT: Negative for congestion, ear discharge, ear pain, hearing loss, nosebleeds, sinus pain, sore throat and tinnitus.   Eyes: Negative for blurred vision and double vision.  Respiratory: Negative for cough, hemoptysis, sputum production and shortness of breath.   Cardiovascular: Negative for chest pain, palpitations and leg swelling.  Gastrointestinal: Negative for abdominal pain, blood in stool, constipation, diarrhea, heartburn, melena, nausea and vomiting.  Genitourinary: Negative for dysuria, frequency, hematuria and urgency.  Musculoskeletal: Positive for joint pain (chronic bilateral knee pain, improving). Negative for back pain, falls, myalgias and neck pain.       Foot improved after antibiotics and indomethacin.  Skin: Negative for itching and rash.  Neurological: Positive for sensory change (neuropathy in finger tips, improving). Negative for dizziness, tingling, weakness and headaches.       Ambulating with cane  Endo/Heme/Allergies: Does not bruise/bleed easily.  Psychiatric/Behavioral: Negative for depression and memory loss. The patient is not nervous/anxious and does not have insomnia.   All other systems reviewed and are negative.  Performance status (ECOG): 1  Vitals Blood pressure 124/62, pulse 72, temperature (!) 97.3 F  (36.3 C), temperature source Tympanic, resp. rate 18, height 5' 8"  (1.727 m), weight 267 lb 12 oz (121.5 kg), SpO2 100 %.   Physical Exam Vitals and nursing note reviewed.  Constitutional:      General: He is not in acute distress.    Appearance: Normal appearance. He is well-developed. He is not diaphoretic.     Interventions: Face mask in place.     Comments: Patient sitting comfortably in a wheelchair in no acute distress.  He is examined on the table.  HENT:     Head: Normocephalic and atraumatic.     Right Ear: Hearing and external ear normal.     Left Ear: Hearing normal.     Mouth/Throat:     Mouth: Mucous membranes are moist. No oral lesions.     Pharynx: Oropharynx is clear. No oropharyngeal exudate.  Eyes:     General: No scleral icterus.  Right eye: No discharge.        Left eye: No discharge.     Extraocular Movements: Extraocular movements intact.     Conjunctiva/sclera: Conjunctivae normal.     Pupils: Pupils are equal, round, and reactive to light.  Neck:     Vascular: No JVD.  Cardiovascular:     Rate and Rhythm: Normal rate and regular rhythm.     Heart sounds: Normal heart sounds. No murmur heard.  No friction rub. No gallop.   Pulmonary:     Effort: Pulmonary effort is normal. No respiratory distress.     Breath sounds: Normal breath sounds. No wheezing, rhonchi or rales.  Chest:     Chest wall: No tenderness.  Abdominal:     General: Bowel sounds are normal. There is no distension.     Palpations: Abdomen is soft. There is no hepatomegaly, splenomegaly or mass.     Tenderness: There is no abdominal tenderness. There is no guarding or rebound.  Musculoskeletal:        General: No tenderness. Normal range of motion.     Cervical back: Normal range of motion and neck supple.     Right lower leg: No edema.     Left lower leg: No edema.     Comments: Left foot improved and near baseline.  Lymphadenopathy:     Head:     Right side of head: No  preauricular, posterior auricular or occipital adenopathy.     Left side of head: No preauricular, posterior auricular or occipital adenopathy.     Cervical: No cervical adenopathy.     Right cervical: No superficial cervical adenopathy.    Left cervical: No superficial cervical adenopathy.     Upper Body:     Right upper body: No supraclavicular or axillary adenopathy.     Left upper body: No supraclavicular or axillary adenopathy.     Lower Body: No right inguinal adenopathy. No left inguinal adenopathy.  Skin:    General: Skin is warm and dry.     Coloration: Skin is not pale.     Findings: No bruising, erythema, lesion or rash.  Neurological:     Mental Status: He is alert and oriented to person, place, and time. Mental status is at baseline.  Psychiatric:        Mood and Affect: Mood normal.        Behavior: Behavior normal.        Thought Content: Thought content normal.        Judgment: Judgment normal.        Appointment on 11/12/2019  Component Date Value Ref Range Status  . Magnesium 11/12/2019 1.5* 1.7 - 2.4 mg/dL Final   Performed at [facility], [address], [place], [place] [address]  . Sodium 11/12/2019 139  135 - 145 mmol/L Final  . Potassium 11/12/2019 3.8  3.5 - 5.1 mmol/L Final  . Chloride 11/12/2019 104  98 - 111 mmol/L Final  . CO2 11/12/2019 25  22 - 32 mmol/L Final  . Glucose, Bld 11/12/2019 200* 70 - 99 mg/dL Final   Glucose reference range applies only to samples taken after fasting for at least 8 hours.  . BUN 11/12/2019 12  8 - 23 mg/dL Final  . Creatinine, Ser 11/12/2019 0.72  0.61 - 1.24 mg/dL Final  . Calcium 11/12/2019 9.4  8.9 - 10.3 mg/dL Final  . Total Protein 11/12/2019 7.2  6.5 - 8.1 g/dL Final  . Albumin  11/12/2019 3.4* 3.5 - 5.0 g/dL Final  . AST 11/12/2019 49* 15 - 41 U/L Final  . ALT 11/12/2019 44  0 - 44 U/L Final  . Alkaline Phosphatase 11/12/2019 56  38 - 126 U/L Final  . Total Bilirubin 11/12/2019 0.7  0.3 - 1.2  mg/dL Final  . GFR calc non Af Amer 11/12/2019 >60  >60 mL/min Final  . GFR calc Af Amer 11/12/2019 >60  >60 mL/min Final  . Anion gap 11/12/2019 10  5 - 15 Final   Performed at [facility], [address], [place], [place] [address]  . WBC 11/12/2019 5.4  4.0 - 10.5 K/uL Final  . RBC 11/12/2019 3.81* 4.22 - 5.81 MIL/uL Final  . Hemoglobin 11/12/2019 12.0* 13.0 - 17.0 g/dL Final  . HCT 11/12/2019 35.3* 39 - 52 % Final  . MCV 11/12/2019 92.7  80.0 - 100.0 fL Final  . MCH 11/12/2019 31.5  26.0 - 34.0 pg Final  . MCHC 11/12/2019 34.0  30.0 - 36.0 g/dL Final  . RDW 11/12/2019 14.7  11.5 - 15.5 % Final  . Platelets 11/12/2019 176  150 - 400 K/uL Final  . nRBC 11/12/2019 0.0  0.0 - 0.2 % Final  . Neutrophils Relative % 11/12/2019 58  % Final  . Neutro Abs 11/12/2019 3.2  1.7 - 7.7 K/uL Final  . Lymphocytes Relative 11/12/2019 31  % Final  . Lymphs Abs 11/12/2019 1.7  0.7 - 4.0 K/uL Final  . Monocytes Relative 11/12/2019 8  % Final  . Monocytes Absolute 11/12/2019 0.4  0 - 1 K/uL Final  . Eosinophils Relative 11/12/2019 1  % Final  . Eosinophils Absolute 11/12/2019 0.0  0 - 0 K/uL Final  . Basophils Relative 11/12/2019 1  % Final  . Basophils Absolute 11/12/2019 0.1  0 - 0 K/uL Final  . Immature Granulocytes 11/12/2019 1  % Final  . Abs Immature Granulocytes 11/12/2019 0.03  0.00 - 0.07 K/uL Final   Performed at [facility], [address], [place], [place] [address]    Assessment:  JOURNEE KOHEN is a 79 y.o. male  withmetastatic colon cancer. He was diagnosed with stage I colon cancers/p transverse colectomy on 10/17/2013. 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 Tumor extended into the submucosa. Margins were negative. There was lymphovascular invasion. 14 lymph nodes were negative.Pathologic stagewas T1 N0.  Colonoscopyon 10/02/2014 noted several 3  mm polyps and an 8 mm polyp in the cecum and transverse colon. Pathology revealed tubular adenomas negative for high-grade dysplasia and malignancy. Colonoscopyon 02/16/2017 revealed 2 diminutive polyps in the descending colon. Pathology revealed tubular adenomas without dysplasia or malignancy.  CEAhas been followed: 3.1 on 04/27/2014, 2.3 on 11/11/2014, 3.0 on 05/12/2015, 2.7 on 11/10/2015, 3.2 on 05/10/2016, 2.8 on 11/01/2016, 3.3 on 04/25/2017, 3.1 on 10/31/2017, 4.0 on 05/01/2018, 7.0on 11/28/2018, 7.4 on 12/13/2018, 10.5 on 03/25/2019, 17.8on 06/25/2019, 16.2 on 07/09/2019, 13.8 on 07/23/2019, 11.9 on 07/30/2019, 6.1 on 08/27/2019, 6.1 on 09/10/2019, 6.4 on 10/01/2019, 6.9 on 10/15/2019, and 7.7 on 10/29/2019.  Chest, abdomen and pelvis CTon 12/20/2018 revealed postoperative findings of transverse colon resection without evidence of recurrent mass, definite lymphadenopathy, or distant metastatic disease to explain rising CEA. Therewereprominent sub-centimeter retroperitoneal and iliac lymph nodes, uncertain significance. There were postoperative findings about the left renal hilum, of uncertain nature, with a broad-based postoperative fat containing left-sided lumbar hernia.There was bibasilar bronchiectasis and scarring with  multiple post infectious or inflammatory pneumatoceles, unchanged in comparison to CT date 06/23/2011.  PET scanon 04/02/2019 revealed a2.4 x 2.3 cm (SUV 11)hypermetabolic soft tissue density caudal and anterior to the pancreatic neckfavored to represent isolated peritoneal or nodal metastasis in the setting of prior transverse colonic resection (expected primary drainage). Although this was immediately adjacent to the pancreas, a fat plane was maintained, arguing strongly against a pancreatic primary. Otherwise,there wasno evidence of hypermetabolic metastasis.  Upper endoscopic ultrasoundon 04/17/2019 revealeda normal esophagus, stomach, duodenum, and  pancreas.There was a 2.4 x 2.4 cm irregularmassin the retroperitoneum adjacent to, but not involving the pancreatic neck. FNA and core needle biopsy were performed. Pathologyrevealed adenocarcinoma compatible with a metastatic lesion of colorectal origin. Tumor cells were positive for CK20 and CDX2andnegative for CK7.  Omniseqon 05/15/2019 revealed + KRAS and TP53. Negative results included BRAF V600E, Her2, NRAS, NTRK, PD-L1 (<1%), and TMB 8.7/Mb (intermediate). MMR testingfrom his colon resection on 10/16/2013 was intact with a low probability of MSI-H.  He is s/p8cyclesof FOLFOXchemotherapy (07/09/2019- 08/27/2019; 09/17/2019 - 10/15/2019).He received Neulasta after cycle #4 and #5 secondary to progressive leukopenia.  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  He received Janey Genta on day 8 of cycle #8.  Abdomen and pelvis CT on 10/09/2019 revealed a 2.4 x 2.3 cm lobulated nodule anterior to the pancreatic neck, previously FDG PET avid, not changed in size although decreased in internal attenuation suggesting treatment response. There was no evidence of new metastatic disease in the abdomen or pelvis.  There were unchanged prominent subcentimeter retroperitoneal and celiac axis lymph nodes, not previously FDG avid and nonspecific. There were postoperative findings about the left renal hilum with a broad-based left-sided lumbar hernia and hepatic steatosis.  He has chronic right lower extremity edema. Right lower extremity duplexon 12/24/2018 revealed no DVT. He has a small right Baker's cyst.Bilateral lower extremityduplexon 08/16/2019 revealed no evidence of deep venous thrombosis in either lower extremity. Increased pulsatility of the venous waveforms bilaterally. These findings suggestedelevated right heart pressures. Differential considerations includedtricuspid regurgitation, right heart failure, pulmonary arterial hypertension and chronic COPD.  Hewas admitted  to [facility] 08/16/2019 -08/17/2019 with right lower extremitycellulitis.He was unable to bear weight. Hewas treated withIV fluids, NSAIDs, colchicine,and broad antibiotics (vancomycin and Cefepime).He was discharged onindomethacin x 5 daysand Keflex 500 mg TID x 5 days.  He has received his COVID-19 vaccine.  Symptomatically, he is doing well.  His left foot is back to baseline.  Neuropathy has improved.  Plan: 1.   Labs today:  CBC with diff, CMP, Mg. 2. Metastaticcolon cancer Clinically, he is doing well. He initially presented with stage I disease s/p resection. PET scan on 04/02/2019 revealeda2.4 x 2.3 cm (SUV 11)soft tissue density caudal and anterior to the pancreatic neck. 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 Lesion was not resectable. NGS revealed no targetable mutation. MMR on original resection revealed a a low probability of MSI-H. He declined Avastin. He is s/p 8cycles of FOLFOX chemotherapy. He required Neulasta with cycle #4 and 5.  He subsequently developed pseudogout.   He received Zarxio with cycle #8 and subsequent pseudogout with a peak WBC of 20,800.   Discuss plan to use Zarxio sparingly to decrease peak WBC. CEA has decreased from 17.8 to 7.7. Labs reviewed.  Begin cycle #9 FOLFOX (oxaliplatin at 65 mg/m2).  Discuss symptom management. He has antimetics at home to use on a prn bases.  Interventions are adequate.   3. Pseudogout Patient has experienced  pseudogout following Neulasta as well as Zarxio.  Recent episode has resolved on Keflex and indomethacin.  Pseudogout following Zarxio was more self-limited likely secondary to peak WBC.  Discuss plans to limit Zarxio to once per cycle depending on WBC. 4.  Hypomagnesemia Magnesium 1.5.  Magnesium 2 g IV today. 5.   Cycle #9 FOLFOX today. 6.   RTC in 2 days for pump disconnect. 7.   RTC on 11/21/2019 for labs (CBC with diff) and +/- Zarxio. 8.   RTC in 2 weeks for MD assessment, labs (CBC with diff, CMP, Mg, CEA) and cycle #10 FOLFOX chemotherapy  I discussed the assessment and treatment plan with the patient.  The patient was provided an opportunity to ask questions and all were answered.  The patient agreed with the plan and demonstrated an understanding of the instructions.  The patient was advised to call back if the symptoms worsen or if the condition fails to improve as anticipated.   Lequita Asal, MD, PhD    11/12/2019, 11:31 AM  I, Mirian Mo Tufford, am acting as Education administrator for [employer]. Mike Gip, MD, PhD.  I, Olevia Westervelt C. Mike Gip, MD, have reviewed the above documentation for accuracy and completeness, and I agree with the above.

## 2019-11-12 ENCOUNTER — Inpatient Hospital Stay (HOSPITAL_BASED_OUTPATIENT_CLINIC_OR_DEPARTMENT_OTHER): Payer: Medicare Other | Admitting: Hematology and Oncology

## 2019-11-12 ENCOUNTER — Other Ambulatory Visit: Payer: Self-pay

## 2019-11-12 ENCOUNTER — Inpatient Hospital Stay: Payer: Medicare Other

## 2019-11-12 ENCOUNTER — Encounter: Payer: Self-pay | Admitting: Hematology and Oncology

## 2019-11-12 VITALS — BP 124/62 | HR 72 | Temp 97.3°F | Resp 18 | Ht 68.0 in | Wt 267.7 lb

## 2019-11-12 VITALS — BP 121/67 | HR 60 | Temp 97.5°F | Resp 18

## 2019-11-12 DIAGNOSIS — C184 Malignant neoplasm of transverse colon: Secondary | ICD-10-CM | POA: Diagnosis not present

## 2019-11-12 DIAGNOSIS — C786 Secondary malignant neoplasm of retroperitoneum and peritoneum: Secondary | ICD-10-CM

## 2019-11-12 DIAGNOSIS — M11279 Other chondrocalcinosis, unspecified ankle and foot: Secondary | ICD-10-CM

## 2019-11-12 DIAGNOSIS — Z5111 Encounter for antineoplastic chemotherapy: Secondary | ICD-10-CM

## 2019-11-12 DIAGNOSIS — Z85038 Personal history of other malignant neoplasm of large intestine: Secondary | ICD-10-CM

## 2019-11-12 LAB — CBC WITH DIFFERENTIAL/PLATELET
Abs Immature Granulocytes: 0.03 10*3/uL (ref 0.00–0.07)
Basophils Absolute: 0.1 10*3/uL (ref 0.0–0.1)
Basophils Relative: 1 %
Eosinophils Absolute: 0 10*3/uL (ref 0.0–0.5)
Eosinophils Relative: 1 %
HCT: 35.3 % — ABNORMAL LOW (ref 39.0–52.0)
Hemoglobin: 12 g/dL — ABNORMAL LOW (ref 13.0–17.0)
Immature Granulocytes: 1 %
Lymphocytes Relative: 31 %
Lymphs Abs: 1.7 10*3/uL (ref 0.7–4.0)
MCH: 31.5 pg (ref 26.0–34.0)
MCHC: 34 g/dL (ref 30.0–36.0)
MCV: 92.7 fL (ref 80.0–100.0)
Monocytes Absolute: 0.4 10*3/uL (ref 0.1–1.0)
Monocytes Relative: 8 %
Neutro Abs: 3.2 10*3/uL (ref 1.7–7.7)
Neutrophils Relative %: 58 %
Platelets: 176 10*3/uL (ref 150–400)
RBC: 3.81 MIL/uL — ABNORMAL LOW (ref 4.22–5.81)
RDW: 14.7 % (ref 11.5–15.5)
WBC: 5.4 10*3/uL (ref 4.0–10.5)
nRBC: 0 % (ref 0.0–0.2)

## 2019-11-12 LAB — COMPREHENSIVE METABOLIC PANEL
ALT: 44 U/L (ref 0–44)
AST: 49 U/L — ABNORMAL HIGH (ref 15–41)
Albumin: 3.4 g/dL — ABNORMAL LOW (ref 3.5–5.0)
Alkaline Phosphatase: 56 U/L (ref 38–126)
Anion gap: 10 (ref 5–15)
BUN: 12 mg/dL (ref 8–23)
CO2: 25 mmol/L (ref 22–32)
Calcium: 9.4 mg/dL (ref 8.9–10.3)
Chloride: 104 mmol/L (ref 98–111)
Creatinine, Ser: 0.72 mg/dL (ref 0.61–1.24)
GFR calc Af Amer: >60 mL/min (ref >60)
GFR calc non Af Amer: >60 mL/min (ref >60)
Glucose, Bld: 200 mg/dL — ABNORMAL HIGH (ref 70–99)
Potassium: 3.8 mmol/L (ref 3.5–5.1)
Sodium: 139 mmol/L (ref 135–145)
Total Bilirubin: 0.7 mg/dL (ref 0.3–1.2)
Total Protein: 7.2 g/dL (ref 6.5–8.1)

## 2019-11-12 LAB — MAGNESIUM: Magnesium: 1.5 mg/dL — ABNORMAL LOW (ref 1.7–2.4)

## 2019-11-12 MED ORDER — OXALIPLATIN CHEMO INJECTION 100 MG/20ML
65.0000 mg/m2 | Freq: Once | INTRAVENOUS | Status: AC
  Administered 2019-11-12: 160 mg via INTRAVENOUS
  Filled 2019-11-12: qty 20

## 2019-11-12 MED ORDER — DEXTROSE 5 % IV SOLN
Freq: Once | INTRAVENOUS | Status: AC
  Filled 2019-11-12: qty 250

## 2019-11-12 MED ORDER — FLUOROURACIL CHEMO INJECTION 2.5 GM/50ML
1000.0000 mg | Freq: Once | INTRAVENOUS | Status: AC
  Administered 2019-11-12: 1000 mg via INTRAVENOUS
  Filled 2019-11-12: qty 20

## 2019-11-12 MED ORDER — SODIUM CHLORIDE 0.9 % IV SOLN
10.0000 mg | Freq: Once | INTRAVENOUS | Status: AC
  Administered 2019-11-12: 10 mg via INTRAVENOUS
  Filled 2019-11-12: qty 10

## 2019-11-12 MED ORDER — PALONOSETRON HCL INJECTION 0.25 MG/5ML
0.2500 mg | Freq: Once | INTRAVENOUS | Status: AC
  Administered 2019-11-12: 0.25 mg via INTRAVENOUS
  Filled 2019-11-12: qty 5

## 2019-11-12 MED ORDER — SODIUM CHLORIDE 0.9 % IV SOLN
5900.0000 mg | INTRAVENOUS | Status: DC
  Administered 2019-11-12: 5900 mg via INTRAVENOUS
  Filled 2019-11-12: qty 118

## 2019-11-12 MED ORDER — LEUCOVORIN CALCIUM INJECTION 350 MG
1000.0000 mg | Freq: Once | INTRAVENOUS | Status: AC
  Administered 2019-11-12: 1000 mg via INTRAVENOUS
  Filled 2019-11-12: qty 50

## 2019-11-12 NOTE — Progress Notes (Signed)
No new changes noted today. The patient reports his left foot is doing better with little pain ( pain level today 2).

## 2019-11-14 ENCOUNTER — Other Ambulatory Visit: Payer: Self-pay

## 2019-11-14 ENCOUNTER — Inpatient Hospital Stay: Payer: Medicare Other | Attending: Hematology and Oncology

## 2019-11-14 ENCOUNTER — Inpatient Hospital Stay: Payer: Medicare Other

## 2019-11-14 VITALS — BP 151/80 | HR 60 | Temp 98.7°F | Resp 18

## 2019-11-14 DIAGNOSIS — C184 Malignant neoplasm of transverse colon: Secondary | ICD-10-CM | POA: Insufficient documentation

## 2019-11-14 DIAGNOSIS — T451X5A Adverse effect of antineoplastic and immunosuppressive drugs, initial encounter: Secondary | ICD-10-CM | POA: Diagnosis not present

## 2019-11-14 DIAGNOSIS — Z85038 Personal history of other malignant neoplasm of large intestine: Secondary | ICD-10-CM

## 2019-11-14 DIAGNOSIS — Z7982 Long term (current) use of aspirin: Secondary | ICD-10-CM | POA: Insufficient documentation

## 2019-11-14 DIAGNOSIS — Z5111 Encounter for antineoplastic chemotherapy: Secondary | ICD-10-CM | POA: Diagnosis present

## 2019-11-14 DIAGNOSIS — Z79899 Other long term (current) drug therapy: Secondary | ICD-10-CM | POA: Insufficient documentation

## 2019-11-14 DIAGNOSIS — M1120 Other chondrocalcinosis, unspecified site: Secondary | ICD-10-CM | POA: Diagnosis not present

## 2019-11-14 DIAGNOSIS — C786 Secondary malignant neoplasm of retroperitoneum and peritoneum: Secondary | ICD-10-CM | POA: Diagnosis not present

## 2019-11-14 DIAGNOSIS — Z791 Long term (current) use of non-steroidal anti-inflammatories (NSAID): Secondary | ICD-10-CM | POA: Insufficient documentation

## 2019-11-14 DIAGNOSIS — Z803 Family history of malignant neoplasm of breast: Secondary | ICD-10-CM | POA: Insufficient documentation

## 2019-11-14 DIAGNOSIS — E669 Obesity, unspecified: Secondary | ICD-10-CM | POA: Diagnosis not present

## 2019-11-14 DIAGNOSIS — I10 Essential (primary) hypertension: Secondary | ICD-10-CM | POA: Insufficient documentation

## 2019-11-14 DIAGNOSIS — G620 Drug-induced polyneuropathy: Secondary | ICD-10-CM | POA: Diagnosis not present

## 2019-11-14 MED ORDER — SODIUM CHLORIDE 0.9% FLUSH
3.0000 mL | INTRAVENOUS | Status: DC | PRN
  Administered 2019-11-14: 3 mL
  Filled 2019-11-14: qty 3

## 2019-11-14 MED ORDER — HEPARIN SOD (PORK) LOCK FLUSH 100 UNIT/ML IV SOLN
500.0000 [IU] | Freq: Once | INTRAVENOUS | Status: AC | PRN
  Administered 2019-11-14: 500 [IU]
  Filled 2019-11-14: qty 5

## 2019-11-21 ENCOUNTER — Inpatient Hospital Stay: Payer: Medicare Other

## 2019-11-21 ENCOUNTER — Other Ambulatory Visit: Payer: Self-pay

## 2019-11-21 VITALS — BP 133/83 | HR 82 | Temp 98.7°F | Resp 18

## 2019-11-21 DIAGNOSIS — Z5111 Encounter for antineoplastic chemotherapy: Secondary | ICD-10-CM

## 2019-11-21 DIAGNOSIS — C184 Malignant neoplasm of transverse colon: Secondary | ICD-10-CM

## 2019-11-21 LAB — CBC WITH DIFFERENTIAL/PLATELET
Abs Immature Granulocytes: 0.02 10*3/uL (ref 0.00–0.07)
Basophils Absolute: 0 10*3/uL (ref 0.0–0.1)
Basophils Relative: 0 %
Eosinophils Absolute: 0.1 10*3/uL (ref 0.0–0.5)
Eosinophils Relative: 1 %
HCT: 35.1 % — ABNORMAL LOW (ref 39.0–52.0)
Hemoglobin: 12.1 g/dL — ABNORMAL LOW (ref 13.0–17.0)
Immature Granulocytes: 0 %
Lymphocytes Relative: 30 %
Lymphs Abs: 2.1 10*3/uL (ref 0.7–4.0)
MCH: 31.4 pg (ref 26.0–34.0)
MCHC: 34.5 g/dL (ref 30.0–36.0)
MCV: 91.2 fL (ref 80.0–100.0)
Monocytes Absolute: 0.5 10*3/uL (ref 0.1–1.0)
Monocytes Relative: 7 %
Neutro Abs: 4.3 10*3/uL (ref 1.7–7.7)
Neutrophils Relative %: 62 %
Platelets: 190 10*3/uL (ref 150–400)
RBC: 3.85 MIL/uL — ABNORMAL LOW (ref 4.22–5.81)
RDW: 14.2 % (ref 11.5–15.5)
WBC: 7.1 10*3/uL (ref 4.0–10.5)
nRBC: 0 % (ref 0.0–0.2)

## 2019-11-21 MED ORDER — FILGRASTIM-SNDZ 480 MCG/0.8ML IJ SOSY: 480.0000 ug | PREFILLED_SYRINGE | Freq: Once | INTRAMUSCULAR | Status: DC

## 2019-11-26 ENCOUNTER — Other Ambulatory Visit: Payer: Self-pay | Admitting: Hematology and Oncology

## 2019-11-26 ENCOUNTER — Inpatient Hospital Stay (HOSPITAL_BASED_OUTPATIENT_CLINIC_OR_DEPARTMENT_OTHER): Payer: Medicare Other | Admitting: Nurse Practitioner

## 2019-11-26 ENCOUNTER — Encounter: Payer: Self-pay | Admitting: Nurse Practitioner

## 2019-11-26 ENCOUNTER — Inpatient Hospital Stay: Payer: Medicare Other

## 2019-11-26 ENCOUNTER — Other Ambulatory Visit: Payer: Self-pay

## 2019-11-26 VITALS — BP 116/73 | HR 84 | Temp 98.0°F | Resp 18

## 2019-11-26 VITALS — BP 151/71 | HR 83 | Temp 96.4°F | Wt 272.6 lb

## 2019-11-26 DIAGNOSIS — Z85038 Personal history of other malignant neoplasm of large intestine: Secondary | ICD-10-CM

## 2019-11-26 DIAGNOSIS — C786 Secondary malignant neoplasm of retroperitoneum and peritoneum: Secondary | ICD-10-CM

## 2019-11-26 DIAGNOSIS — C189 Malignant neoplasm of colon, unspecified: Secondary | ICD-10-CM

## 2019-11-26 DIAGNOSIS — Z5111 Encounter for antineoplastic chemotherapy: Secondary | ICD-10-CM

## 2019-11-26 DIAGNOSIS — C184 Malignant neoplasm of transverse colon: Secondary | ICD-10-CM

## 2019-11-26 LAB — CBC WITH DIFFERENTIAL/PLATELET
Abs Immature Granulocytes: 0.01 10*3/uL (ref 0.00–0.07)
Basophils Absolute: 0 10*3/uL (ref 0.0–0.1)
Basophils Relative: 1 %
Eosinophils Absolute: 0.1 10*3/uL (ref 0.0–0.5)
Eosinophils Relative: 2 %
HCT: 33.8 % — ABNORMAL LOW (ref 39.0–52.0)
Hemoglobin: 11.4 g/dL — ABNORMAL LOW (ref 13.0–17.0)
Immature Granulocytes: 0 %
Lymphocytes Relative: 35 %
Lymphs Abs: 1.5 10*3/uL (ref 0.7–4.0)
MCH: 31.2 pg (ref 26.0–34.0)
MCHC: 33.7 g/dL (ref 30.0–36.0)
MCV: 92.6 fL (ref 80.0–100.0)
Monocytes Absolute: 0.4 10*3/uL (ref 0.1–1.0)
Monocytes Relative: 8 %
Neutro Abs: 2.3 10*3/uL (ref 1.7–7.7)
Neutrophils Relative %: 54 %
Platelets: 140 10*3/uL — ABNORMAL LOW (ref 150–400)
RBC: 3.65 MIL/uL — ABNORMAL LOW (ref 4.22–5.81)
RDW: 14.1 % (ref 11.5–15.5)
WBC: 4.2 10*3/uL (ref 4.0–10.5)
nRBC: 0 % (ref 0.0–0.2)

## 2019-11-26 LAB — COMPREHENSIVE METABOLIC PANEL
ALT: 42 U/L (ref 0–44)
AST: 50 U/L — ABNORMAL HIGH (ref 15–41)
Albumin: 3.3 g/dL — ABNORMAL LOW (ref 3.5–5.0)
Alkaline Phosphatase: 54 U/L (ref 38–126)
Anion gap: 7 (ref 5–15)
BUN: 13 mg/dL (ref 8–23)
CO2: 26 mmol/L (ref 22–32)
Calcium: 9.4 mg/dL (ref 8.9–10.3)
Chloride: 106 mmol/L (ref 98–111)
Creatinine, Ser: 0.75 mg/dL (ref 0.61–1.24)
GFR calc Af Amer: >60 mL/min (ref >60)
GFR calc non Af Amer: >60 mL/min (ref >60)
Glucose, Bld: 157 mg/dL — ABNORMAL HIGH (ref 70–99)
Potassium: 3.7 mmol/L (ref 3.5–5.1)
Sodium: 139 mmol/L (ref 135–145)
Total Bilirubin: 0.8 mg/dL (ref 0.3–1.2)
Total Protein: 7.1 g/dL (ref 6.5–8.1)

## 2019-11-26 LAB — MAGNESIUM: Magnesium: 1.6 mg/dL — ABNORMAL LOW (ref 1.7–2.4)

## 2019-11-26 MED ORDER — FLUOROURACIL CHEMO INJECTION 2.5 GM/50ML
400.0000 mg/m2 | Freq: Once | INTRAVENOUS | Status: AC
  Administered 2019-11-26: 950 mg via INTRAVENOUS
  Filled 2019-11-26: qty 19

## 2019-11-26 MED ORDER — MAGNESIUM SULFATE 2 GM/50ML IV SOLN
2.0000 g | Freq: Once | INTRAVENOUS | Status: AC
  Administered 2019-11-26: 2 g via INTRAVENOUS
  Filled 2019-11-26: qty 50

## 2019-11-26 MED ORDER — LEUCOVORIN CALCIUM INJECTION 350 MG
411.5000 mg/m2 | Freq: Once | INTRAVENOUS | Status: AC
  Administered 2019-11-26: 1000 mg via INTRAVENOUS
  Filled 2019-11-26: qty 50

## 2019-11-26 MED ORDER — SODIUM CHLORIDE 0.9 % IV SOLN
10.0000 mg | Freq: Once | INTRAVENOUS | Status: AC
  Administered 2019-11-26: 10 mg via INTRAVENOUS
  Filled 2019-11-26: qty 1

## 2019-11-26 MED ORDER — SODIUM CHLORIDE 0.9 % IV SOLN
2427.0000 mg/m2 | INTRAVENOUS | Status: DC
  Administered 2019-11-26: 5900 mg via INTRAVENOUS
  Filled 2019-11-26: qty 118

## 2019-11-26 MED ORDER — DEXTROSE 5 % IV SOLN
Freq: Once | INTRAVENOUS | Status: AC
  Filled 2019-11-26: qty 250

## 2019-11-26 MED ORDER — PALONOSETRON HCL INJECTION 0.25 MG/5ML
0.2500 mg | Freq: Once | INTRAVENOUS | Status: AC
  Administered 2019-11-26: 0.25 mg via INTRAVENOUS
  Filled 2019-11-26: qty 5

## 2019-11-26 MED ORDER — OXALIPLATIN CHEMO INJECTION 100 MG/20ML
65.0000 mg/m2 | Freq: Once | INTRAVENOUS | Status: AC
  Administered 2019-11-26: 160 mg via INTRAVENOUS
  Filled 2019-11-26: qty 32

## 2019-11-26 NOTE — Progress Notes (Signed)
[facility]  [address] [place], [place] [address] Phone: [phone]  Fax: [phone]   Clinic Day:  11/26/2019  Referring physician: Tracie Harrier, MD  Chief Complaint: Jared Gutierrez is a 79 y.o. male with metastaticcolon cancerwho is seen for assessment prior to cycle #10 FOLFOX chemotherapy.  HPI: Patient last seen in clinic on 11/12/2019 for cycle 9 of FOLFOX chemotherapy.  Today he returns to clinic for evaluation and consideration of cycle 10 of FOLFOX chemotherapy.  Today, he says that he feels well other than being tired from staying up till 4 AM watching TV.  He denies any changes to his skin, rash, diarrhea, nausea or vomiting, difficulty swallowing, mouth sores, abnormal bleeding or bruising, headache.  Interval infections or fever.  He previously suffered infection of the foot and pseudogout from growth factor support.  He says that since he had antibiotics since resolved and no further complications.  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  He has been getting stronger while working with physical therapy at the facility where he resides.  He is accompanied by his wife [name] today who also contributes to history.   Past Medical History:  Diagnosis Date  . Arthritis    OSTEOARTHRITIS  . Cancer ([place])   . Cavitary lesion of lung    RIGHT LOWER LOBE  . Chicken pox   . Colon cancer ([place])   . History of kidney stones   . Hypertension   . Lipoma of colon   . Nephrolithiasis   . Nephrolithiasis   . Obesity   . Shingles   . Tubular adenoma of colon    multiple fragments    Past Surgical History:  Procedure Laterality Date  . COLON SURGERY    . COLONOSCOPY N/A 10/02/2014   Procedure: COLONOSCOPY;  Surgeon: Josefine Class, MD;  Location: [facility] ENDOSCOPY;  Service: Endoscopy;  Laterality: N/A;  . COLONOSCOPY WITH PROPOFOL N/A 02/16/2017   Procedure: COLONOSCOPY WITH PROPOFOL;  Surgeon: Manya Silvas, MD;   Location: [facility] ENDOSCOPY;  Service: Endoscopy;  Laterality: N/A;  . EUS N/A 04/17/2019   Procedure: FULL UPPER ENDOSCOPIC ULTRASOUND (EUS) RADIAL;  Surgeon: Holly Bodily, MD;  Location: [facility] ENDOSCOPY;  Service: Gastroenterology;  Laterality: N/A;  . KIDNEY STONE SURGERY    . PARTIAL COLECTOMY  10/17/2013  . PORTACATH PLACEMENT Right 06/13/2019   Procedure: INSERTION PORT-A-CATH;  Surgeon: Benjamine Sprague, DO;  Location: [facility] ORS;  Service: General;  Laterality: Right;    Family History  Problem Relation Age of Onset  . Cancer Mother   . Breast cancer Mother   . COPD Father     Social History:  reports that he has never smoked. He has never used smokeless tobacco. He reports current alcohol use. He reports that he does not use drugs. 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He is married and his wife's name is [name] [phone]).  Allergies: No Known Allergies  Current Medications: Current Outpatient Medications  Medication Sig Dispense Refill  . amLODipine (NORVASC) 2.5 MG tablet Take 2.5 mg by mouth daily.    [name] aspirin EC 81 MG tablet Take 81 mg by mouth daily.    . Cholecalciferol (VITAMIN D) 125 MCG (5000 UT) CAPS Take 5,000 mg by mouth.    . docusate sodium ([employer] STOOL SOFTENER) 100 MG capsule Take 100 mg by mouth 2 (two) times daily.    [name] HYDROcodone-acetaminophen (NORCO/VICODIN) 5-325  MG tablet Take 1 tablet by mouth every 6 (six) hours as needed for moderate pain (up to 3 doses for moderate pain.).     [name] lidocaine-prilocaine (EMLA) cream Apply to affected area once 30 g 3  . loratadine (CLARITIN) 10 MG tablet Take 10 mg by mouth daily.    [name] olmesartan (BENICAR) 20 MG tablet Take 20 mg by mouth daily.    . potassium chloride SA (KLOR-CON M20) 20 MEQ tablet Take by mouth.    . Probiotic Product (PROBIOTIC DAILY PO) Take 1 tablet by mouth daily.    . cephALEXin (KEFLEX) 500 MG capsule Take 500 mg by mouth 3 (three) times daily. (Patient not  taking: Reported on 11/12/2019)    . ibuprofen (ADVIL) 800 MG tablet Take 800 mg by mouth every 8 (eight) hours as needed. (Patient not taking: Reported on 11/26/2019)    . ondansetron (ZOFRAN) 8 MG tablet Take 1 tablet (8 mg total) by mouth 2 (two) times daily as needed for refractory nausea / vomiting. Start on day 3 after chemotherapy. (Patient not taking: Reported on 11/26/2019) 30 tablet 1   No current facility-administered medications for this visit.   Facility-Administered Medications Ordered in Other Visits  Medication Dose Route Frequency Provider Last Rate Last Admin  . 0.9 %  sodium chloride infusion   Intravenous Once Corcoran, Melissa C, MD      . sodium chloride flush (NS) 0.9 % injection 10 mL  10 mL Intracatheter PRN Lequita Asal, MD   10 mL at 10/03/19 1354    Review of Systems  Constitutional: Negative for chills, diaphoresis, fever, malaise/fatigue and weight loss (weight gain).  HENT: Negative for congestion, ear discharge, ear pain, hearing loss, nosebleeds, sinus pain, sore throat and tinnitus.   Eyes: Negative for blurred vision.  Respiratory: Negative for cough, hemoptysis, sputum production and shortness of breath.   Cardiovascular: Negative for chest pain, palpitations and leg swelling.       Reports that he took his blood pressure medicine this morning  Gastrointestinal: Negative for abdominal pain, blood in stool, constipation, diarrhea, heartburn, melena, nausea and vomiting.  Genitourinary: Negative for dysuria, frequency, hematuria and urgency.  Musculoskeletal: Positive for joint pain (knee pain- improved). Negative for back pain, falls, myalgias and neck pain.       Foot improved after antibiotics and indomethacin.  Skin: Negative for itching and rash.  Neurological: Negative for dizziness, tingling, sensory change (neuropathy resolved), weakness and headaches.       Ambulating with cane  Endo/Heme/Allergies: Does not bruise/bleed easily.    Psychiatric/Behavioral: Negative for depression and memory loss. The patient is not nervous/anxious and does not have insomnia.   All other systems reviewed and are negative.  Performance status (ECOG): 1  Vitals Blood pressure (!) 151/71, pulse 83, temperature (!) 96.4 F (35.8 C), temperature source Tympanic, weight 272 lb 9.6 oz (123.6 kg), SpO2 98 %.   Physical Exam Vitals and nursing note reviewed.  Constitutional:      General: He is not in acute distress.    Appearance: Normal appearance. He is well-developed. He is obese.     Interventions: Face mask in place.     Comments: Accompanied by wife.   HENT:     Head: Normocephalic and atraumatic.     Mouth/Throat:     Mouth: No oral lesions.  Eyes:     General: No scleral icterus.    Conjunctiva/sclera: Conjunctivae normal.  Cardiovascular:     Rate and Rhythm:  Normal rate and regular rhythm.     Pulses: Normal pulses.  Pulmonary:     Effort: Pulmonary effort is normal. No respiratory distress.  Abdominal:     General: Bowel sounds are normal. There is no distension.     Palpations: Abdomen is soft. There is no hepatomegaly or splenomegaly.  Musculoskeletal:        General: No tenderness. Normal range of motion.     Cervical back: Normal range of motion and neck supple.     Right lower leg: No edema.     Left lower leg: No edema.     Comments: Left foot improved and near baseline.  Lymphadenopathy:     Head:     Right side of head: No preauricular, posterior auricular or occipital adenopathy.     Left side of head: No preauricular, posterior auricular or occipital adenopathy.     Upper Body:     Right upper body: No supraclavicular or axillary adenopathy.     Left upper body: No supraclavicular or axillary adenopathy.     Lower Body: No right inguinal adenopathy. No left inguinal adenopathy.  Skin:    General: Skin is warm.     Findings: No rash.  Neurological:     Mental Status: He is alert and oriented to  person, place, and time. Mental status is at baseline.  Psychiatric:        Mood and Affect: Mood normal.        Behavior: Behavior normal.        Thought Content: Thought content normal.        Judgment: Judgment normal.     Appointment on 11/26/2019  Component Date Value Ref Range Status  . Magnesium 11/26/2019 1.6* 1.7 - 2.4 mg/dL Final   Performed at [facility], [address], [place], [place] [address]  . Sodium 11/26/2019 139  135 - 145 mmol/L Final  . Potassium 11/26/2019 3.7  3.5 - 5.1 mmol/L Final  . Chloride 11/26/2019 106  98 - 111 mmol/L Final  . CO2 11/26/2019 26  22 - 32 mmol/L Final  . Glucose, Bld 11/26/2019 157* 70 - 99 mg/dL Final   Glucose reference range applies only to samples taken after fasting for at least 8 hours.  . BUN 11/26/2019 13  8 - 23 mg/dL Final  . Creatinine, Ser 11/26/2019 0.75  0.61 - 1.24 mg/dL Final  . Calcium 11/26/2019 9.4  8.9 - 10.3 mg/dL Final  . Total Protein 11/26/2019 7.1  6.5 - 8.1 g/dL Final  . Albumin 11/26/2019 3.3* 3.5 - 5.0 g/dL Final  . AST 11/26/2019 50* 15 - 41 U/L Final  . ALT 11/26/2019 42  0 - 44 U/L Final  . Alkaline Phosphatase 11/26/2019 54  38 - 126 U/L Final  . Total Bilirubin 11/26/2019 0.8  0.3 - 1.2 mg/dL Final  . GFR calc non Af Amer 11/26/2019 >60  >60 mL/min Final  . GFR calc Af Amer 11/26/2019 >60  >60 mL/min Final  . Anion gap 11/26/2019 7  5 - 15 Final   Performed at [facility], [address], [place],  [address]  . WBC 11/26/2019 4.2  4.0 - 10.5 K/uL Final  . RBC 11/26/2019 3.65* 4.22 - 5.81 MIL/uL Final  . Hemoglobin 11/26/2019 11.4* 13.0 - 17.0 g/dL Final  . HCT 11/26/2019 33.8* 39 - 52 % Final  . MCV 11/26/2019 92.6  80.0 - 100.0 fL Final  . [facility] 11/26/2019 31.2  26.0 - 34.0 pg Final  . MCHC 11/26/2019 33.7  30.0 - 36.0 g/dL Final  . RDW 11/26/2019 14.1  11.5 - 15.5 % Final  . Platelets 11/26/2019 140* 150 - 400 K/uL Final  . nRBC 11/26/2019 0.0  0.0 - 0.2 %  Final  . Neutrophils Relative % 11/26/2019 54  % Final  . Neutro Abs 11/26/2019 2.3  1.7 - 7.7 K/uL Final  . Lymphocytes Relative 11/26/2019 35  % Final  . Lymphs Abs 11/26/2019 1.5  0.7 - 4.0 K/uL Final  . Monocytes Relative 11/26/2019 8  % Final  . Monocytes Absolute 11/26/2019 0.4  0 - 1 K/uL Final  . Eosinophils Relative 11/26/2019 2  % Final  . Eosinophils Absolute 11/26/2019 0.1  0 - 0 K/uL Final  . Basophils Relative 11/26/2019 1  % Final  . Basophils Absolute 11/26/2019 0.0  0 - 0 K/uL Final  . Immature Granulocytes 11/26/2019 0  % Final  . Abs Immature Granulocytes 11/26/2019 0.01  0.00 - 0.07 K/uL Final   Performed at [facility], [address] [place], [place] [address]    Assessment:  COYE DAWOOD is a 79 y.o. male  withmetastatic colon cancer. He was diagnosed with stage I colon cancers/p transverse colectomy on 10/17/2013. 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 Tumor extended into the submucosa. Margins were negative. There was lymphovascular invasion. 14 lymph nodes were negative.Pathologic stagewas T1 N0.  Colonoscopyon 10/02/2014 noted several 3 mm polyps and an 8 mm polyp in the cecum and transverse colon. Pathology revealed tubular adenomas negative for high-grade dysplasia and malignancy. Colonoscopyon 02/16/2017 revealed 2 diminutive polyps in the descending colon. Pathology revealed tubular adenomas without dysplasia or malignancy.  CEAhas been followed: 3.1 on 04/27/2014, 2.3 on 11/11/2014, 3.0 on 05/12/2015, 2.7 on 11/10/2015, 3.2 on 05/10/2016, 2.8 on 11/01/2016, 3.3 on 04/25/2017, 3.1 on 10/31/2017, 4.0 on 05/01/2018, 7.0on 11/28/2018, 7.4 on 12/13/2018, 10.5 on 03/25/2019, 17.8on 06/25/2019, 16.2 on 07/09/2019, 13.8 on 07/23/2019, 11.9 on 07/30/2019, 6.1 on 08/27/2019, 6.1 on 09/10/2019, 6.4 on 10/01/2019, 6.9 on 10/15/2019, and  7.7 on 10/29/2019.  Chest, abdomen and pelvis CTon 12/20/2018 revealed postoperative findings of transverse colon resection without evidence of recurrent mass, definite lymphadenopathy, or distant metastatic disease to explain rising CEA. Therewereprominent sub-centimeter retroperitoneal and iliac lymph nodes, uncertain significance. There were postoperative findings about the left renal hilum, of uncertain nature, with a broad-based postoperative fat containing left-sided lumbar hernia.There was bibasilar bronchiectasis and scarring with multiple post infectious or inflammatory pneumatoceles, unchanged in comparison to CT date 06/23/2011.  PET scanon 04/02/2019 revealed a2.4 x 2.3 cm (SUV 11)hypermetabolic soft tissue density caudal and anterior to the pancreatic neckfavored to represent isolated peritoneal or nodal metastasis in the setting of prior transverse colonic resection (expected primary drainage). Although this was immediately adjacent to the pancreas, a fat plane was maintained, arguing strongly against a pancreatic primary. Otherwise,there wasno evidence of hypermetabolic metastasis.  Upper endoscopic ultrasoundon 04/17/2019 revealeda normal esophagus, stomach, duodenum, and pancreas.There was a 2.4 x 2.4 cm irregularmassin the retroperitoneum adjacent to, but not involving the pancreatic neck. FNA and core needle biopsy were performed. Pathologyrevealed adenocarcinoma compatible with a metastatic lesion of colorectal origin. Tumor cells were positive for CK20 and CDX2andnegative for CK7.  Omniseqon 05/15/2019 revealed + KRAS and TP53. Negative results included BRAF V600E, Her2, NRAS, NTRK, PD-L1 (<1%), and TMB 8.7/Mb (intermediate). MMR testingfrom his colon resection on 10/16/2013  was intact with a low probability of MSI-H.  He is s/p9cyclesof FOLFOXchemotherapy (07/09/2019- 08/27/2019; 09/17/2019 - 11/12/2019).He received Neulasta after cycle #4 and  #5 secondary to progressive leukopenia.  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  He received Janey Genta on day 8 of cycle #8.  Abdomen and pelvis CT on 10/09/2019 revealed a 2.4 x 2.3 cm lobulated nodule anterior to the pancreatic neck, previously FDG PET avid, not changed in size although decreased in internal attenuation suggesting treatment response. There was no evidence of new metastatic disease in the abdomen or pelvis.  There were unchanged prominent subcentimeter retroperitoneal and celiac axis lymph nodes, not previously FDG avid and nonspecific. There were postoperative findings about the left renal hilum with a broad-based left-sided lumbar hernia and hepatic steatosis.  He has chronic right lower extremity edema. Right lower extremity duplexon 12/24/2018 revealed no DVT. He has a small right Baker's cyst.Bilateral lower extremityduplexon 08/16/2019 revealed no evidence of deep venous thrombosis in either lower extremity. Increased pulsatility of the venous waveforms bilaterally. These findings suggestedelevated right heart pressures. Differential considerations includedtricuspid regurgitation, right heart failure, pulmonary arterial hypertension and chronic COPD.  Hewas admitted to [facility] 08/16/2019 -08/17/2019 with right lower extremitycellulitis.He was unable to bear weight. Hewas treated withIV fluids, NSAIDs, colchicine,and broad antibiotics (vancomycin and Cefepime).He was discharged onindomethacin x 5 daysand Keflex 500 mg TID x 5 days.  He has received his COVID-19 vaccine.  Symptomatically, he is doing well.  His left foot is back to baseline.  Neuropathy has improved. Overall he feels well today and denies specific complaints.   Plan: 1. Labs today were reviewed independently and discussed in detail with patient: CBC, CMP, Mg, CEA.  2. Metastaticcolon cancer  Initially presented with stage I disease s/p resection.   PET scan on 04/02/2019 revealed  a 2.4 x 2.3 cm soft tissue density caudal and anterior to pancreatic neck. Biopsy confirmed adenocarcinoma c/w colorectal origin. Not resectable.   NGS revealed no targetable mutation. MMR on original resection revealed a low probability of MSI-H.   He declined Avastin.   Currently s/p 9 cycles of FOLFOX chemotherapy  He was given neulasta after cycles 4 and 5 and subsequently developed pseudogout. Neulasta discontinues. He received Zarxio with cycle 8 and subsequent pseudogout with a peak wbc of 20,800.   Plan to use zarxio sparingly to decrease peak wbc.   CEA has decreased from 17.8 to 7.7. Pending today.   Labs reviewed and acceptable for treatment. Proceed with cycle 10 of FOLFOX. Oxaliplatin at 65 mg/m2.  3. Pseudogout  Experienced pseudogout following neulasta and zarxio  Recent episode resolved w/ keflex and indomethacin  Plan to limit zarxio once per cycle depending on wbc.   Hold zarxio today.  4. Hypomagnesemia Magnesium 1.6  Magnesium 2 g IV today. Disposition:  Cycle #10 FOLFOX today. RTC in 2 days for pump disconnect. RTC in 2 weeks for MD assessment, labs (CBC with diff, CMP, Mg, CEA) and cycle #11 FOLFOX chemotherapy  I discussed the assessment and treatment plan with the patient.  The patient was provided an opportunity to ask questions and all were answered.  The patient agreed with the plan and demonstrated an understanding of the instructions.  The patient was advised to call back if the symptoms worsen or if the condition fails to improve as anticipated.  Beckey Rutter, DNP, AGNP-C [place] at [facility] [phone] (clinic)

## 2019-11-27 ENCOUNTER — Other Ambulatory Visit: Payer: Medicare Other

## 2019-11-27 ENCOUNTER — Ambulatory Visit: Payer: Medicare Other | Admitting: Hematology and Oncology

## 2019-11-27 LAB — CEA: CEA: 9 ng/mL — ABNORMAL HIGH (ref 0.0–4.7)

## 2019-11-28 ENCOUNTER — Inpatient Hospital Stay: Payer: Medicare Other | Attending: Hematology and Oncology

## 2019-11-28 ENCOUNTER — Other Ambulatory Visit: Payer: Self-pay

## 2019-11-28 VITALS — BP 164/79 | HR 60 | Temp 98.7°F

## 2019-11-28 DIAGNOSIS — C184 Malignant neoplasm of transverse colon: Secondary | ICD-10-CM | POA: Insufficient documentation

## 2019-11-28 DIAGNOSIS — Z85038 Personal history of other malignant neoplasm of large intestine: Secondary | ICD-10-CM

## 2019-11-28 DIAGNOSIS — Z79899 Other long term (current) drug therapy: Secondary | ICD-10-CM | POA: Insufficient documentation

## 2019-11-28 DIAGNOSIS — M791 Myalgia, unspecified site: Secondary | ICD-10-CM | POA: Diagnosis not present

## 2019-11-28 DIAGNOSIS — I10 Essential (primary) hypertension: Secondary | ICD-10-CM | POA: Insufficient documentation

## 2019-11-28 DIAGNOSIS — G629 Polyneuropathy, unspecified: Secondary | ICD-10-CM | POA: Diagnosis not present

## 2019-11-28 DIAGNOSIS — R600 Localized edema: Secondary | ICD-10-CM | POA: Diagnosis not present

## 2019-11-28 DIAGNOSIS — Z7982 Long term (current) use of aspirin: Secondary | ICD-10-CM | POA: Insufficient documentation

## 2019-11-28 DIAGNOSIS — C786 Secondary malignant neoplasm of retroperitoneum and peritoneum: Secondary | ICD-10-CM | POA: Diagnosis not present

## 2019-11-28 DIAGNOSIS — Z791 Long term (current) use of non-steroidal anti-inflammatories (NSAID): Secondary | ICD-10-CM | POA: Diagnosis not present

## 2019-11-28 DIAGNOSIS — E669 Obesity, unspecified: Secondary | ICD-10-CM | POA: Diagnosis not present

## 2019-11-28 DIAGNOSIS — L03115 Cellulitis of right lower limb: Secondary | ICD-10-CM | POA: Diagnosis not present

## 2019-11-28 DIAGNOSIS — L03031 Cellulitis of right toe: Secondary | ICD-10-CM | POA: Insufficient documentation

## 2019-11-28 MED ORDER — HEPARIN SOD (PORK) LOCK FLUSH 100 UNIT/ML IV SOLN
INTRAVENOUS | Status: AC
  Filled 2019-11-28: qty 5

## 2019-11-28 MED ORDER — HEPARIN SOD (PORK) LOCK FLUSH 100 UNIT/ML IV SOLN
500.0000 [IU] | Freq: Once | INTRAVENOUS | Status: AC | PRN
  Administered 2019-11-28: 500 [IU]
  Filled 2019-11-28: qty 5

## 2019-11-28 MED ORDER — SODIUM CHLORIDE 0.9% FLUSH
10.0000 mL | INTRAVENOUS | Status: DC | PRN
  Administered 2019-11-28: 10 mL
  Filled 2019-11-28: qty 10

## 2019-11-30 ENCOUNTER — Encounter: Payer: Self-pay | Admitting: Hematology and Oncology

## 2019-11-30 ENCOUNTER — Other Ambulatory Visit: Payer: Self-pay

## 2019-11-30 NOTE — Progress Notes (Signed)
[facility]  [address], [address] [place], [place] [address] Phone: [phone]  Fax: [phone]   Clinic Day:  12/01/2019  Referring physician: Tracie Harrier, MD  Chief Complaint: Jared Gutierrez is a 79 y.o. male with metastaticcolon cancerwho is seen for assessment on day 6 of cycle #10 FOLFOX chemotherapy.  HPI: The patient was last seen in the medical onoclogy clinic on 11/12/2019. At that time, he was doing well. His left foot was back to baseline. Neuropathy had improved. Hematocrit 35.3, hemoglobin 12.0, platelets 176,000, WBC 5,400. Magnesium 1.5. We discussed plans to limit growth factor support secondary to recurrent issues with pseudo-gout.   He received 2 gm of IV magnesium.  He received cycle #9 FOLFOX.  CBC on 11/21/2019 showed hematocrit 35.1, hemoglobin 12.1, platelets 190,000, WBC 7,100.   The patient was seen in the medical oncology clinic by Beckey Rutter, NP on 11/26/2019. At that time, he was doing well.  Neuropathy had improved. Overall, he felt well and denied specific complaints. Hematocrit was 33.8, hemoglobin 11.4, platelets 140,000, WBC 4,200.  Magnesium was 1.6.  CEA was 9.0. He received cycle #10 FOLFOX and 2 gm of IV magnesium .   During the interim, he felt "pretty good". Last week, he did well with treatment. The neuropathy in his fingers are the same without pain. He has no trouble buttoning buttons. He is not dropping anything. His neuropathy does not bother him at night. He denies neuropathy in his feet.    Past Medical History:  Diagnosis Date  . Arthritis    OSTEOARTHRITIS  . Cancer ([place])   . Cavitary lesion of lung    RIGHT LOWER LOBE  . Chicken pox   . Colon cancer ([place])   . History of kidney stones   . Hypertension   . Lipoma of colon   . Nephrolithiasis   . Nephrolithiasis   . Obesity   . Shingles   . Tubular adenoma of colon    multiple fragments    Past Surgical History:  Procedure Laterality  Date  . COLON SURGERY    . COLONOSCOPY N/A 10/02/2014   Procedure: COLONOSCOPY;  Surgeon: Josefine Class, MD;  Location: [facility] ENDOSCOPY;  Service: Endoscopy;  Laterality: N/A;  . COLONOSCOPY WITH PROPOFOL N/A 02/16/2017   Procedure: COLONOSCOPY WITH PROPOFOL;  Surgeon: Manya Silvas, MD;  Location: [facility] ENDOSCOPY;  Service: Endoscopy;  Laterality: N/A;  . EUS N/A 04/17/2019   Procedure: FULL UPPER ENDOSCOPIC ULTRASOUND (EUS) RADIAL;  Surgeon: Holly Bodily, MD;  Location: [facility] ENDOSCOPY;  Service: Gastroenterology;  Laterality: N/A;  . KIDNEY STONE SURGERY    . PARTIAL COLECTOMY  10/17/2013  . PORTACATH PLACEMENT Right 06/13/2019   Procedure: INSERTION PORT-A-CATH;  Surgeon: Benjamine Sprague, DO;  Location: [facility] ORS;  Service: General;  Laterality: Right;    Family History  Problem Relation Age of Onset  . Cancer Mother   . Breast cancer Mother   . COPD Father     Social History:  reports that he has never smoked. He has never used smokeless tobacco. He reports current alcohol use. He reports that he does not use drugs. 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He is married and his wife's name is [name] [phone]).The patient is alone today.  Allergies: No Known Allergies  Current Medications: Current Outpatient Medications  Medication Sig Dispense Refill  . amLODipine (NORVASC) 2.5 MG tablet Take 2.5 mg by mouth daily.    [name]  aspirin EC 81 MG tablet Take 81 mg by mouth daily.    . Cholecalciferol (VITAMIN D) 125 MCG (5000 UT) CAPS Take 5,000 mg by mouth.    . docusate sodium ([employer] STOOL SOFTENER) 100 MG capsule Take 100 mg by mouth 2 (two) times daily.    [name] HYDROcodone-acetaminophen (NORCO/VICODIN) 5-325 MG tablet Take 1 tablet by mouth every 6 (six) hours as needed for moderate pain (up to 3 doses for moderate pain.).     [name] lidocaine-prilocaine (EMLA) cream Apply to affected area once 30 g 3  . loratadine (CLARITIN) 10 MG tablet Take 10 mg by  mouth daily.    [name] olmesartan (BENICAR) 20 MG tablet Take 20 mg by mouth daily.    . potassium chloride SA (KLOR-CON M20) 20 MEQ tablet Take by mouth.    . Probiotic Product (PROBIOTIC DAILY PO) Take 1 tablet by mouth daily.    . cephALEXin (KEFLEX) 500 MG capsule Take 500 mg by mouth 3 (three) times daily. (Patient not taking: Reported on 11/12/2019)    . ibuprofen (ADVIL) 800 MG tablet Take 800 mg by mouth every 8 (eight) hours as needed. (Patient not taking: Reported on 11/26/2019)    . ondansetron (ZOFRAN) 8 MG tablet Take 1 tablet (8 mg total) by mouth 2 (two) times daily as needed for refractory nausea / vomiting. Start on day 3 after chemotherapy. (Patient not taking: Reported on 11/26/2019) 30 tablet 1   No current facility-administered medications for this visit.   Facility-Administered Medications Ordered in Other Visits  Medication Dose Route Frequency Provider Last Rate Last Admin  . 0.9 %  sodium chloride infusion   Intravenous Once Glyn Gerads C, MD      . sodium chloride flush (NS) 0.9 % injection 10 mL  10 mL Intracatheter PRN Lequita Asal, MD   10 mL at 10/03/19 1354    Review of Systems  Constitutional: Negative for chills, diaphoresis, fever, malaise/fatigue and weight loss.       Feels "pretty good".  HENT: Negative for congestion, ear discharge, ear pain, hearing loss, nosebleeds, sinus pain, sore throat and tinnitus.   Eyes: Negative for blurred vision and double vision.  Respiratory: Negative for cough, hemoptysis, sputum production and shortness of breath.   Cardiovascular: Negative for chest pain, palpitations and leg swelling.  Gastrointestinal: Negative for abdominal pain, blood in stool, constipation, diarrhea, heartburn, melena, nausea and vomiting.  Genitourinary: Negative for dysuria, frequency, hematuria and urgency.  Musculoskeletal: Negative for back pain, falls, joint pain (chronic bilateral knee pain), myalgias and neck pain.  Skin: Negative for  itching and rash.  Neurological: Positive for sensory change (neuropathy in finger tips, improving). Negative for dizziness, tingling, weakness and headaches.       Ambulating with cane  Endo/Heme/Allergies: Does not bruise/bleed easily.  Psychiatric/Behavioral: Negative for depression and memory loss. The patient is not nervous/anxious and does not have insomnia.   All other systems reviewed and are negative.  Performance status (ECOG): 1  Vitals Blood pressure (!) 154/77, pulse 92, temperature (!) 97.5 F (36.4 C), temperature source Tympanic, weight 255 lb 11.7 oz (116 kg), SpO2 100 %.   Physical Exam Vitals and nursing note reviewed.  Constitutional:      General: He is not in acute distress.    Appearance: Normal appearance. He is well-developed. He is not diaphoretic.     Interventions: Face mask in place.  HENT:     Head: Normocephalic and atraumatic.  Eyes:  General: No scleral icterus.    Extraocular Movements: Extraocular movements intact.     Conjunctiva/sclera: Conjunctivae normal.  Neurological:     Mental Status: He is alert and oriented to person, place, and time. Mental status is at baseline.  Psychiatric:        Mood and Affect: Mood normal.        Behavior: Behavior normal.        Thought Content: Thought content normal.        Judgment: Judgment normal.    No visits with results within 3 Day(s) from this visit.  Latest known visit with results is:  Appointment on 11/26/2019  Component Date Value Ref Range Status  . CEA 11/26/2019 9.0* 0.0 - 4.7 ng/mL Final   Comment: (NOTE)                             Nonsmokers          <3.9                             Smokers             <5.6 [employer] Electrochemiluminescence Immunoassay (ECLIA) Values obtained with different assay methods or kits cannot be used interchangeably.  Results cannot be interpreted as absolute evidence of the presence or absence of malignant disease. Performed At: [facility] [place], [place] [id number] Rush Farmer MD KK:[id number]   . Magnesium 11/26/2019 1.6* 1.7 - 2.4 mg/dL Final   Performed at [facility], [address], [place], [place] [address]  . Sodium 11/26/2019 139  135 - 145 mmol/L Final  . Potassium 11/26/2019 3.7  3.5 - 5.1 mmol/L Final  . Chloride 11/26/2019 106  98 - 111 mmol/L Final  . CO2 11/26/2019 26  22 - 32 mmol/L Final  . Glucose, Bld 11/26/2019 157* 70 - 99 mg/dL Final   Glucose reference range applies only to samples taken after fasting for at least 8 hours.  . BUN 11/26/2019 13  8 - 23 mg/dL Final  . Creatinine, Ser 11/26/2019 0.75  0.61 - 1.24 mg/dL Final  . Calcium 11/26/2019 9.4  8.9 - 10.3 mg/dL Final  . Total Protein 11/26/2019 7.1  6.5 - 8.1 g/dL Final  . Albumin 11/26/2019 3.3* 3.5 - 5.0 g/dL Final  . AST 11/26/2019 50* 15 - 41 U/L Final  . ALT 11/26/2019 42  0 - 44 U/L Final  . Alkaline Phosphatase 11/26/2019 54  38 - 126 U/L Final  . Total Bilirubin 11/26/2019 0.8  0.3 - 1.2 mg/dL Final  . GFR calc non Af Amer 11/26/2019 >60  >60 mL/min Final  . GFR calc Af Amer 11/26/2019 >60  >60 mL/min Final  . Anion gap 11/26/2019 7  5 - 15 Final   Performed at [facility], [address] [place], [place] [address]  . WBC 11/26/2019 4.2  4.0 - 10.5 K/uL Final  . RBC 11/26/2019 3.65* 4.22 - 5.81 MIL/uL Final  . Hemoglobin 11/26/2019 11.4* 13.0 - 17.0 g/dL Final  . HCT 11/26/2019 33.8* 39 - 52 % Final  . MCV 11/26/2019 92.6  80.0 - 100.0 fL Final  . MCH 11/26/2019 31.2  26.0 - 34.0 pg Final  . MCHC 11/26/2019 33.7  30.0 - 36.0 g/dL Final  . RDW 11/26/2019 14.1  11.5 - 15.5 % Final  .  Platelets 11/26/2019 140* 150 - 400 K/uL Final  . nRBC 11/26/2019 0.0  0.0 - 0.2 % Final  . Neutrophils Relative % 11/26/2019 54  % Final  . Neutro Abs 11/26/2019 2.3  1.7 - 7.7 K/uL Final  . Lymphocytes Relative 11/26/2019 35  % Final  . Lymphs Abs 11/26/2019 1.5  0.7 - 4.0 K/uL Final  .  Monocytes Relative 11/26/2019 8  % Final  . Monocytes Absolute 11/26/2019 0.4  0 - 1 K/uL Final  . Eosinophils Relative 11/26/2019 2  % Final  . Eosinophils Absolute 11/26/2019 0.1  0 - 0 K/uL Final  . Basophils Relative 11/26/2019 1  % Final  . Basophils Absolute 11/26/2019 0.0  0 - 0 K/uL Final  . Immature Granulocytes 11/26/2019 0  % Final  . Abs Immature Granulocytes 11/26/2019 0.01  0.00 - 0.07 K/uL Final   Performed at [facility], [address] [place], [place] [address]    Assessment:  Jared Gutierrez is a 79 y.o. male  withmetastatic colon cancer. He was diagnosed with stage I colon cancers/p transverse colectomy on 10/17/2013. 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 Tumor extended into the submucosa. Margins were negative. There was lymphovascular invasion. 14 lymph nodes were negative.Pathologic stagewas T1 N0.  Colonoscopyon 10/02/2014 noted several 3 mm polyps and an 8 mm polyp in the cecum and transverse colon. Pathology revealed tubular adenomas negative for high-grade dysplasia and malignancy. Colonoscopyon 02/16/2017 revealed 2 diminutive polyps in the descending colon. Pathology revealed tubular adenomas without dysplasia or malignancy.  CEAhas been followed: 3.1 on 04/27/2014, 2.3 on 11/11/2014, 3.0 on 05/12/2015, 2.7 on 11/10/2015, 3.2 on 05/10/2016, 2.8 on 11/01/2016, 3.3 on 04/25/2017, 3.1 on 10/31/2017, 4.0 on 05/01/2018, 7.0on 11/28/2018, 7.4 on 12/13/2018, 10.5 on 03/25/2019, 17.8on 06/25/2019, 16.2 on 07/09/2019, 13.8 on 07/23/2019, 11.9 on 07/30/2019, 6.1 on 08/27/2019, 6.1 on 09/10/2019, 6.4 on 10/01/2019, 6.9 on 10/15/2019, 7.7 on 10/29/2019, and 9.0 on 11/26/2019.  Chest, abdomen and pelvis CTon 12/20/2018 revealed postoperative findings of transverse colon resection without evidence of recurrent mass, definite lymphadenopathy, or  distant metastatic disease to explain rising CEA. Therewereprominent sub-centimeter retroperitoneal and iliac lymph nodes, uncertain significance. There were postoperative findings about the left renal hilum, of uncertain nature, with a broad-based postoperative fat containing left-sided lumbar hernia.There was bibasilar bronchiectasis and scarring with multiple post infectious or inflammatory pneumatoceles, unchanged in comparison to CT date 06/23/2011.  PET scanon 04/02/2019 revealed a2.4 x 2.3 cm (SUV 11)hypermetabolic soft tissue density caudal and anterior to the pancreatic neckfavored to represent isolated peritoneal or nodal metastasis in the setting of prior transverse colonic resection (expected primary drainage). Although this was immediately adjacent to the pancreas, a fat plane was maintained, arguing strongly against a pancreatic primary. Otherwise,there wasno evidence of hypermetabolic metastasis.  Upper endoscopic ultrasoundon 04/17/2019 revealeda normal esophagus, stomach, duodenum, and pancreas.There was a 2.4 x 2.4 cm irregularmassin the retroperitoneum adjacent to, but not involving the pancreatic neck. FNA and core needle biopsy were performed. Pathologyrevealed adenocarcinoma compatible with a metastatic lesion of colorectal origin. Tumor cells were positive for CK20 and CDX2andnegative for CK7.  Omniseqon 05/15/2019 revealed + KRAS and TP53. Negative results included BRAF V600E, Her2, NRAS, NTRK, PD-L1 (<1%), and TMB 8.7/Mb (intermediate). MMR testingfrom his colon resection on 10/16/2013 was intact with a low probability of MSI-H.  He is day 6 of cycle #10FOLFOXchemotherapy (07/09/2019- 08/27/2019; 09/17/2019 - 10/15/2019; 11/12/2019 - 11/26/2019).He received Neulasta  after cycle #4 and #5 secondary to progressive leukopenia.  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  He received Janey Genta on day 8 of cycle #8.  Abdomen and pelvis CT on  10/09/2019 revealed a 2.4 x 2.3 cm lobulated nodule anterior to the pancreatic neck, previously FDG PET avid, not changed in size although decreased in internal attenuation suggesting treatment response. There was no evidence of new metastatic disease in the abdomen or pelvis.  There were unchanged prominent subcentimeter retroperitoneal and celiac axis lymph nodes, not previously FDG avid and nonspecific. There were postoperative findings about the left renal hilum with a broad-based left-sided lumbar hernia and hepatic steatosis.  He has chronic right lower extremity edema. Right lower extremity duplexon 12/24/2018 revealed no DVT. He has a small right Baker's cyst.Bilateral lower extremityduplexon 08/16/2019 revealed no evidence of deep venous thrombosis in either lower extremity. Increased pulsatility of the venous waveforms bilaterally. These findings suggestedelevated right heart pressures. Differential considerations includedtricuspid regurgitation, right heart failure, pulmonary arterial hypertension and chronic COPD.  [facility] to [facility] 08/16/2019 -08/17/2019 with right lower extremitycellulitis.He was unable to bear weight. Hewas treated withIV fluids, NSAIDs, colchicine,and broad antibiotics (vancomycin and Cefepime).He was discharged onindomethacin x 5 daysand Keflex 500 mg TID x 5 days.  He has received his COVID-19 vaccine.  Symptomatically, he feels "pretty good".  He denies any abdominal symptoms.  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    Plan: 1.   Review CEA trend. 2. Metastaticcolon cancer Clinically, he appears to be doing well. He initially presented with stage I disease s/p resection. PET scan on 04/02/2019 revealeda2.4 x 2.3 cm (SUV 11)soft tissue density caudal and anterior to the pancreatic neck. Biopsy of mass anterior to the pancreas confirmed adenocarcinoma c/w colorectal  origin. Lesion was not resectable. NGS revealed no targetable mutation. MMR on original resection revealed a a low probability of MSI-H. He declined Avastin. He is day 6 s/p cycle #10  FOLFOX chemotherapy. He required Neulasta with cycle #4 and 5.  He subsequently developed pseudogout.   He received Zarxio with cycle #8 and subsequent pseudogout with a peak WBC of 20,800.   Continue limited Zarxio support based on nadir WBC. CEA initially decreased from 17.8 to 6.1 then subsequently increased to 9.0. Discuss plan for repeat staging studies.  Discuss symptom management.  He has antiemetics at home to use on a prn bases.  Interventions are adequate.   .   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  Last episode resolved on Keflex and indomethacin.  Pseudogout following Zarxio was more self-limited likely secondary to peak WBC.  Continue to limit growth factor support. 4. 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  Neuropathy is not affecting function.  Continue to monitor on reduced dose oxaliplatin. 5.   Chest, abdomen, and pelvis CT prior to 12/10/2019. 6.   RTC on 12/05/2019 for labs (CBC with diff) +/- Zarxio. 7.   RTC on 12/10/2019 as scheduled for MD assessment, labs (CBC with diff, CMP, Mg, CEA), review of imaging studies, and +/- FOLFOX chemotherapy.  I discussed the assessment and treatment plan with the patient.  The patient was provided an opportunity to ask questions and all were answered.  The patient agreed with the plan and demonstrated an understanding of the instructions.  The patient was advised to call back if the symptoms worsen or if the condition fails to improve as anticipated.  I  provided 10 minutes of face-to-face visit time during this this encounter and >  50% was spent counseling as documented under my assessment and plan.  I provided these services from the [facility] office.   Lequita Asal, MD, PhD    12/01/2019, 9:24 AM  I, Selena Batten, am acting as scribe for [employer]. Mike Gip, MD, PhD.  I, Khyli Swaim C. Mike Gip, MD, have reviewed the above documentation for accuracy and completeness, and I agree with the above.

## 2019-11-30 NOTE — Progress Notes (Signed)
No new changes noted today. The patient name and DOB has been verified by phone today. 

## 2019-12-01 ENCOUNTER — Inpatient Hospital Stay (HOSPITAL_BASED_OUTPATIENT_CLINIC_OR_DEPARTMENT_OTHER): Payer: Medicare Other | Admitting: Hematology and Oncology

## 2019-12-01 ENCOUNTER — Encounter: Payer: Self-pay | Admitting: Hematology and Oncology

## 2019-12-01 VITALS — BP 154/77 | HR 92 | Temp 97.5°F | Wt 255.7 lb

## 2019-12-01 DIAGNOSIS — Z7189 Other specified counseling: Secondary | ICD-10-CM | POA: Diagnosis not present

## 2019-12-01 DIAGNOSIS — R970 Elevated carcinoembryonic antigen [CEA]: Secondary | ICD-10-CM | POA: Diagnosis not present

## 2019-12-01 DIAGNOSIS — C786 Secondary malignant neoplasm of retroperitoneum and peritoneum: Secondary | ICD-10-CM | POA: Diagnosis not present

## 2019-12-01 DIAGNOSIS — C184 Malignant neoplasm of transverse colon: Secondary | ICD-10-CM

## 2019-12-01 DIAGNOSIS — Z5111 Encounter for antineoplastic chemotherapy: Secondary | ICD-10-CM | POA: Diagnosis not present

## 2019-12-04 ENCOUNTER — Inpatient Hospital Stay: Payer: Medicare Other

## 2019-12-04 ENCOUNTER — Other Ambulatory Visit: Payer: Self-pay

## 2019-12-04 ENCOUNTER — Other Ambulatory Visit: Payer: Self-pay | Admitting: *Deleted

## 2019-12-04 DIAGNOSIS — Z5111 Encounter for antineoplastic chemotherapy: Secondary | ICD-10-CM | POA: Diagnosis not present

## 2019-12-04 DIAGNOSIS — C184 Malignant neoplasm of transverse colon: Secondary | ICD-10-CM

## 2019-12-04 LAB — CBC WITH DIFFERENTIAL/PLATELET
Abs Immature Granulocytes: 0.02 10*3/uL (ref 0.00–0.07)
Basophils Absolute: 0 10*3/uL (ref 0.0–0.1)
Basophils Relative: 1 %
Eosinophils Absolute: 0.1 10*3/uL (ref 0.0–0.5)
Eosinophils Relative: 1 %
HCT: 34.8 % — ABNORMAL LOW (ref 39.0–52.0)
Hemoglobin: 11.5 g/dL — ABNORMAL LOW (ref 13.0–17.0)
Immature Granulocytes: 1 %
Lymphocytes Relative: 44 %
Lymphs Abs: 1.9 10*3/uL (ref 0.7–4.0)
MCH: 31 pg (ref 26.0–34.0)
MCHC: 33 g/dL (ref 30.0–36.0)
MCV: 93.8 fL (ref 80.0–100.0)
Monocytes Absolute: 0.4 10*3/uL (ref 0.1–1.0)
Monocytes Relative: 9 %
Neutro Abs: 1.9 10*3/uL (ref 1.7–7.7)
Neutrophils Relative %: 44 %
Platelets: 147 10*3/uL — ABNORMAL LOW (ref 150–400)
RBC: 3.71 MIL/uL — ABNORMAL LOW (ref 4.22–5.81)
RDW: 13.8 % (ref 11.5–15.5)
WBC: 4.3 10*3/uL (ref 4.0–10.5)
nRBC: 0 % (ref 0.0–0.2)

## 2019-12-05 ENCOUNTER — Ambulatory Visit
Admission: RE | Admit: 2019-12-05 | Discharge: 2019-12-05 | Disposition: A | Discharge status: Home / Self Care | Payer: Medicare Other | Source: Ambulatory Visit | Attending: Hematology and Oncology | Admitting: Hematology and Oncology

## 2019-12-05 DIAGNOSIS — C786 Secondary malignant neoplasm of retroperitoneum and peritoneum: Secondary | ICD-10-CM | POA: Diagnosis present

## 2019-12-05 DIAGNOSIS — C184 Malignant neoplasm of transverse colon: Secondary | ICD-10-CM | POA: Insufficient documentation

## 2019-12-05 MED ORDER — IOHEXOL 300 MG/ML  SOLN
125.0000 mL | Freq: Once | INTRAMUSCULAR | Status: AC | PRN
  Administered 2019-12-05: 125 mL via INTRAVENOUS

## 2019-12-08 NOTE — Progress Notes (Signed)
[facility]  [address], [address] [place],  [address] Phone: [phone]  Fax: [phone]   Clinic Day:  12/10/2019  Referring physician: Tracie Harrier, MD  Chief Complaint: Jared Gutierrez is a 79 y.o. male with metastaticcolon cancerwho is seen assessment prior to cycle #11 FOLFOX chemotherapy.  HPI: The patient was last seen in the medical oncology clinic on 12/01/2019. At that time, he felt "pretty good".  He denied any abdominal symptoms.  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 Hematocrit was 33.8, hemoglobin 11.4, MCV 92.6, platelets 140,000, WBC 4,200. Albumin was 3.3. AST was 50. CEA was 9.0. Magnesium was 1.6. We discussed plans for follow-up imaging studies secondary to a rising CEA.  Chest, abdomen and pelvic CT with contrast on 12/05/2019 revealed mild decrease in size of previous FDG avid soft tissue attenuating lesion within the upper abdomen just inferior and anterior to the pancreatic neck (2.2 cm in maximum dimension vs 2.4 cm previously).  There was no new or progressive disease. There was emphysema and aortic atherosclerosis. 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 There were bilateral renal cysts.  CBC on 12/04/2019 revealed hematocrit 34.8, hemoglobin 11.5, MCV 93.8, platelets 147,000, WBC 4,300 (ANC 1900).  During the interim, he has been "pretty good." He reports a few episodes of diarrhea yesterday, but this has resolved. He did not eat anything out of the ordinary. He does not take potassium, but has pills at home.   Past Medical History:  Diagnosis Date  . Arthritis    OSTEOARTHRITIS  . Cancer ([place])   . Cavitary lesion of lung    RIGHT LOWER LOBE  . Chicken pox   . Colon cancer ([place])   . History of kidney stones   . Hypertension   . Lipoma of colon   . Nephrolithiasis   . Nephrolithiasis   . Obesity   . Shingles   . Tubular adenoma of colon    multiple fragments     Past Surgical History:  Procedure Laterality Date  . COLON SURGERY    . COLONOSCOPY N/A 10/02/2014   Procedure: COLONOSCOPY;  Surgeon: Josefine Class, MD;  Location: [facility] ENDOSCOPY;  Service: Endoscopy;  Laterality: N/A;  . COLONOSCOPY WITH PROPOFOL N/A 02/16/2017   Procedure: COLONOSCOPY WITH PROPOFOL;  Surgeon: Manya Silvas, MD;  Location: [facility] ENDOSCOPY;  Service: Endoscopy;  Laterality: N/A;  . EUS N/A 04/17/2019   Procedure: FULL UPPER ENDOSCOPIC ULTRASOUND (EUS) RADIAL;  Surgeon: Holly Bodily, MD;  Location: [facility] ENDOSCOPY;  Service: Gastroenterology;  Laterality: N/A;  . KIDNEY STONE SURGERY    . PARTIAL COLECTOMY  10/17/2013  . PORTACATH PLACEMENT Right 06/13/2019   Procedure: INSERTION PORT-A-CATH;  Surgeon: Benjamine Sprague, DO;  Location: [facility] ORS;  Service: General;  Laterality: Right;    Family History  Problem Relation Age of Onset  . Cancer Mother   . Breast cancer Mother   . COPD Father     Social History:  reports that he has never smoked. He has never used smokeless tobacco. He reports current alcohol use. He reports that he does not use drugs. 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He is married and his wife's name is [name] [phone]).The patient is alone today.  Allergies: No Known Allergies  Current Medications: Current Outpatient Medications  Medication Sig Dispense Refill  . amLODipine (NORVASC) 2.5 MG tablet Take 2.5 mg by mouth daily.    [name]  aspirin EC 81 MG tablet Take 81 mg by mouth daily.    . Cholecalciferol (VITAMIN D) 125 MCG (5000 UT) CAPS Take 5,000 mg by mouth.    . docusate sodium ([employer] STOOL SOFTENER) 100 MG capsule Take 100 mg by mouth 2 (two) times daily.    [name] HYDROcodone-acetaminophen (NORCO/VICODIN) 5-325 MG tablet Take 1 tablet by mouth every 6 (six) hours as needed for moderate pain (up to 3 doses for moderate pain.).     [name] lidocaine-prilocaine (EMLA) cream Apply to affected area once 30 g 3   . loratadine (CLARITIN) 10 MG tablet Take 10 mg by mouth daily.    [name] olmesartan (BENICAR) 20 MG tablet Take 20 mg by mouth daily.    . potassium chloride SA (KLOR-CON M20) 20 MEQ tablet Take by mouth.    . Probiotic Product (PROBIOTIC DAILY PO) Take 1 tablet by mouth daily.    . cephALEXin (KEFLEX) 500 MG capsule Take 500 mg by mouth 3 (three) times daily. (Patient not taking: Reported on 11/12/2019)    . ibuprofen (ADVIL) 800 MG tablet Take 800 mg by mouth every 8 (eight) hours as needed. (Patient not taking: Reported on 11/26/2019)    . ondansetron (ZOFRAN) 8 MG tablet Take 1 tablet (8 mg total) by mouth 2 (two) times daily as needed for refractory nausea / vomiting. Start on day 3 after chemotherapy. (Patient not taking: Reported on 11/26/2019) 30 tablet 1   No current facility-administered medications for this visit.   Facility-Administered Medications Ordered in Other Visits  Medication Dose Route Frequency Provider Last Rate Last Admin  . 0.9 %  sodium chloride infusion   Intravenous Once Yaremi Stahlman C, MD      . sodium chloride flush (NS) 0.9 % injection 10 mL  10 mL Intracatheter PRN Nolon Stalls C, MD   10 mL at 10/03/19 1354    Review of Systems  Constitutional: Negative for chills, diaphoresis, fever, malaise/fatigue and weight loss (up 13 lbs).       Feels "pretty good".  HENT: Negative for congestion, ear discharge, ear pain, hearing loss, nosebleeds, sinus pain, sore throat and tinnitus.   Eyes: Negative for blurred vision and double vision.  Respiratory: Negative for cough, hemoptysis, sputum production and shortness of breath.   Cardiovascular: Negative for chest pain, palpitations and leg swelling.  Gastrointestinal: Positive for diarrhea (yesterday, resolved). Negative for abdominal pain, blood in stool, constipation, heartburn, melena, nausea and vomiting.  Genitourinary: Negative for dysuria, frequency, hematuria and urgency.  Musculoskeletal: Negative for back  pain, joint pain (chronic bilateral knee pain), myalgias and neck pain.  Skin: Negative for itching and rash.  Neurological: Positive for sensory change (neuropathy in finger tips, improving). Negative for dizziness, tingling, weakness and headaches.       Ambulating with cane  Endo/Heme/Allergies: Does not bruise/bleed easily.  Psychiatric/Behavioral: Negative for depression and memory loss. The patient is not nervous/anxious and does not have insomnia.   All other systems reviewed and are negative.  Performance status (ECOG): 1  Vitals Blood pressure (!) 143/67, pulse 83, temperature 97.6 F (36.4 C), temperature source Tympanic, resp. rate 19, weight (!) 268 lb (121.6 kg), SpO2 100 %.   Physical Exam Vitals and nursing note reviewed.  Constitutional:      General: He is not in acute distress.    Appearance: Normal appearance. He is well-developed. He is not diaphoretic.     Interventions: Face mask in place.     Comments: He has a  cane by his side.  HENT:     Head: Normocephalic and atraumatic.     Comments: White hair and beard.    Mouth/Throat:     Mouth: Mucous membranes are moist.     Pharynx: Oropharynx is clear.  Eyes:     General: No scleral icterus.    Extraocular Movements: Extraocular movements intact.     Conjunctiva/sclera: Conjunctivae normal.     Pupils: Pupils are equal, round, and reactive to light.  Cardiovascular:     Rate and Rhythm: Normal rate and regular rhythm.     Heart sounds: Normal heart sounds. No murmur heard.   Pulmonary:     Effort: Pulmonary effort is normal. No respiratory distress.     Breath sounds: Normal breath sounds. No wheezing or rales.  Chest:     Chest wall: No tenderness.  Abdominal:     General: Bowel sounds are normal. There is no distension.     Palpations: Abdomen is soft. There is no mass.     Tenderness: There is no abdominal tenderness. There is no guarding or rebound.  Musculoskeletal:        General: No swelling or  tenderness. Normal range of motion.     Cervical back: Normal range of motion and neck supple.  Lymphadenopathy:     Head:     Right side of head: No preauricular, posterior auricular or occipital adenopathy.     Left side of head: No preauricular, posterior auricular or occipital adenopathy.     Cervical: No cervical adenopathy.     Upper Body:     Right upper body: No supraclavicular or axillary adenopathy.     Left upper body: No supraclavicular or axillary adenopathy.     Lower Body: No right inguinal adenopathy. No left inguinal adenopathy.  Skin:    General: Skin is warm and dry.  Neurological:     Mental Status: He is alert and oriented to person, place, and time. Mental status is at baseline.  Psychiatric:        Mood and Affect: Mood normal.        Behavior: Behavior normal.        Thought Content: Thought content normal.        Judgment: Judgment normal.    Infusion on 12/10/2019  Component Date Value Ref Range Status  . Magnesium 12/10/2019 1.4* 1.7 - 2.4 mg/dL Final   Performed at [facility], [address], [place], [place] [address]  . Sodium 12/10/2019 137  135 - 145 mmol/L Final  . Potassium 12/10/2019 3.4* 3.5 - 5.1 mmol/L Final  . Chloride 12/10/2019 104  98 - 111 mmol/L Final  . CO2 12/10/2019 25  22 - 32 mmol/L Final  . Glucose, Bld 12/10/2019 169* 70 - 99 mg/dL Final   Glucose reference range applies only to samples taken after fasting for at least 8 hours.  . BUN 12/10/2019 11  8 - 23 mg/dL Final  . Creatinine, Ser 12/10/2019 0.76  0.61 - 1.24 mg/dL Final  . Calcium 12/10/2019 9.1  8.9 - 10.3 mg/dL Final  . Total Protein 12/10/2019 7.0  6.5 - 8.1 g/dL Final  . Albumin 12/10/2019 3.3* 3.5 - 5.0 g/dL Final  . AST 12/10/2019 53* 15 - 41 U/L Final  . ALT 12/10/2019 43  0 - 44 U/L Final  . Alkaline Phosphatase 12/10/2019 57  38 - 126 U/L Final  . Total Bilirubin 12/10/2019 1.0  0.3 - 1.2 mg/dL Final  .  GFR calc non Af Amer 12/10/2019 >60  >60  mL/min Final  . GFR calc Af Amer 12/10/2019 >60  >60 mL/min Final  . Anion gap 12/10/2019 8  5 - 15 Final   Performed at [facility], [address] [place], [place] [address]  . WBC 12/10/2019 2.6* 4.0 - 10.5 K/uL Final  . RBC 12/10/2019 3.74* 4.22 - 5.81 MIL/uL Final  . Hemoglobin 12/10/2019 11.6* 13.0 - 17.0 g/dL Final  . HCT 12/10/2019 35.1* 39 - 52 % Final  . MCV 12/10/2019 93.9  80.0 - 100.0 fL Final  . MCH 12/10/2019 31.0  26.0 - 34.0 pg Final  . MCHC 12/10/2019 33.0  30.0 - 36.0 g/dL Final  . RDW 12/10/2019 14.5  11.5 - 15.5 % Final  . Platelets 12/10/2019 149* 150 - 400 K/uL Final  . nRBC 12/10/2019 0.0  0.0 - 0.2 % Final  . Neutrophils Relative % 12/10/2019 49  % Final  . Neutro Abs 12/10/2019 1.3* 1.7 - 7.7 K/uL Final  . Lymphocytes Relative 12/10/2019 41  % Final  . Lymphs Abs 12/10/2019 1.1  0.7 - 4.0 K/uL Final  . Monocytes Relative 12/10/2019 9  % Final  . Monocytes Absolute 12/10/2019 0.2  0 - 1 K/uL Final  . Eosinophils Relative 12/10/2019 1  % Final  . Eosinophils Absolute 12/10/2019 0.0  0 - 0 K/uL Final  . Basophils Relative 12/10/2019 0  % Final  . Basophils Absolute 12/10/2019 0.0  0 - 0 K/uL Final  . Immature Granulocytes 12/10/2019 0  % Final  . Abs Immature Granulocytes 12/10/2019 0.01  0.00 - 0.07 K/uL Final   Performed at [facility], [address], [place], [place] [address]    Assessment:  Jared Gutierrez is a 79 y.o. male  withmetastatic colon cancer. He was diagnosed with stage I colon cancers/p transverse colectomy on 10/17/2013. 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 Tumor extended into the submucosa. Margins were negative. There was lymphovascular invasion. 14 lymph nodes were negative.Pathologic stagewas T1 N0.  Colonoscopyon 10/02/2014 noted several 3 mm polyps and an 8 mm polyp in the cecum and transverse  colon. Pathology revealed tubular adenomas negative for high-grade dysplasia and malignancy. Colonoscopyon 02/16/2017 revealed 2 diminutive polyps in the descending colon. Pathology revealed tubular adenomas without dysplasia or malignancy.  CEAhas been followed: 3.1 on 04/27/2014, 2.3 on 11/11/2014, 3.0 on 05/12/2015, 2.7 on 11/10/2015, 3.2 on 05/10/2016, 2.8 on 11/01/2016, 3.3 on 04/25/2017, 3.1 on 10/31/2017, 4.0 on 05/01/2018, 7.0on 11/28/2018, 7.4 on 12/13/2018, 10.5 on 03/25/2019, 17.8on 06/25/2019, 16.2 on 07/09/2019, 13.8 on 07/23/2019, 11.9 on 07/30/2019, 6.1 on 08/27/2019, 6.1 on 09/10/2019, 6.4 on 10/01/2019, 6.9 on 10/15/2019, 7.7 on 10/29/2019, and 9.0 on 11/26/2019.  Chest, abdomen and pelvis CTon 12/20/2018 revealed postoperative findings of transverse colon resection without evidence of recurrent mass, definite lymphadenopathy, or distant metastatic disease to explain rising CEA. Therewereprominent sub-centimeter retroperitoneal and iliac lymph nodes, uncertain significance. There were postoperative findings about the left renal hilum, of uncertain nature, with a broad-based postoperative fat containing left-sided lumbar hernia.There was bibasilar bronchiectasis and scarring with multiple post infectious or inflammatory pneumatoceles, unchanged in comparison to CT date 06/23/2011.  PET scanon 04/02/2019 revealed a2.4 x 2.3 cm (SUV 11)hypermetabolic soft tissue density caudal and anterior to the pancreatic neckfavored to represent isolated peritoneal or nodal metastasis in the setting of prior transverse colonic resection (expected primary drainage). Although this  was immediately adjacent to the pancreas, a fat plane was maintained, arguing strongly against a pancreatic primary. Otherwise,there wasno evidence of hypermetabolic metastasis.  Upper endoscopic ultrasoundon 04/17/2019 revealeda normal esophagus, stomach, duodenum, and pancreas.There was a 2.4 x 2.4 cm  irregularmassin the retroperitoneum adjacent to, but not involving the pancreatic neck. FNA and core needle biopsy were performed. Pathologyrevealed adenocarcinoma compatible with a metastatic lesion of colorectal origin. Tumor cells were positive for CK20 and CDX2andnegative for CK7.  Omniseqon 05/15/2019 revealed + KRAS and TP53. Negative results included BRAF V600E, Her2, NRAS, NTRK, PD-L1 (<1%), and TMB 8.7/Mb (intermediate). MMR testingfrom his colon resection on 10/16/2013 was intact with a low probability of MSI-H.  He is s/p cycle #10FOLFOXchemotherapy (07/09/2019- 08/27/2019; 09/17/2019 - 10/15/2019; 11/12/2019 - 11/26/2019).He received Neulasta after cycle #4 and #5 secondary to progressive leukopenia.  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  He received Janey Genta on day 8 of cycle #8.  Abdomen and pelvis CT on 10/09/2019 revealed a 2.4 x 2.3 cm lobulated nodule anterior to the pancreatic neck, previously FDG PET avid, not changed in size although decreased in internal attenuation suggesting treatment response. There was no evidence of new metastatic disease in the abdomen or pelvis.  There were unchanged prominent subcentimeter retroperitoneal and celiac axis lymph nodes, not previously FDG avid and nonspecific. There were postoperative findings about the left renal hilum with a broad-based left-sided lumbar hernia and hepatic steatosis.  Chest, abdomen and pelvic CT on 12/05/2019 revealed mild decrease in size of previous FDG avid soft tissue attenuating lesion within the upper abdomen just inferior and anterior to the pancreatic neck (2.2 cm in maximum dimension vs 2.4 cm previously).  There was no new or progressive disease.  He has chronic right lower extremity edema. Right lower extremity duplexon 12/24/2018 revealed no DVT. He has a small right Baker's cyst.Bilateral lower extremityduplexon 08/16/2019 revealed no evidence of deep venous thrombosis in either  lower extremity. Increased pulsatility of the venous waveforms bilaterally. These findings suggestedelevated right heart pressures. Differential considerations includedtricuspid regurgitation, right heart failure, pulmonary arterial hypertension and chronic COPD.  Hewas admitted to [facility] 08/16/2019 -08/17/2019 with right lower extremitycellulitis.He was unable to bear weight. Hewas treated withIV fluids, NSAIDs, colchicine,and broad antibiotics (vancomycin and Cefepime).He was discharged onindomethacin x 5 daysand Keflex 500 mg TID x 5 days.  He has received his COVID-19 vaccine.  Symptomatically, he feels "pretty good".  He has had some limited interval diarrhea.  Exam is stable.  WBC is 2600 with an ANC of 1300.  Potassium is 3.4 and magnesium 1.4.  Plan: 1.   Labs today: CBC with diff, CMP, Mg, CEA 2. Metastaticcolon cancer Clinically, he is doing well. He initially presented with stage I disease s/p resection. PET scan on 04/02/2019 revealeda2.4 x 2.3 cm (SUV 11)soft tissue density caudal and anterior to the pancreatic neck. 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 Lesion was not resectable. NGS revealed no targetable mutation. MMR on original resection revealed a a low probability of MSI-H. He declined Avastin. He is s/p cycle #10 FOLFOX chemotherapy. He required Neulasta with cycle #4 and 5.  He subsequently developed pseudogout.   He received Zarxio with cycle #8 and subsequent pseudogout with a peak WBC of 20,800.   Discussed continued limited use of Zarxio to prevent delays in treatment as well as pseudogout. CEA initially decreased from 17.8 to 6.1 then subsequently increased to 9.0. Review interval chest, abdomen and pelvic CT scan.  Images personally  reviewed.  Agree with radiology interpretation.   The prior FDG avid soft tissue mass in the upper abdomen has decreased slightly in size.  There is no new disease.  Discuss plans to continue current therapy and likely switch to Xeloda after 12 cycles of FOLFOX.  Counts reviewed.  ANC 1300 (low) in patient who cannot receive Neulasta.   Hold chemotherapy today.  Discuss symptom management.  He has antiemetics at home to use on a prn bases.  Interventions are adequate.    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  Last episode resolved on Keflex and indomethacin.  Pseudogout following Zarxio was more self-limited likely secondary to peak WBC.  Discuss use of Zarxio with next cycle. 4. Chemotherapy induced neuropathy  Patient has a mild grade II neuropathy in his fingers.  Neuropathy does not affect function.  Continue monitoring closely with low-dose oxaliplatin. 5.   Hypomagnesemia   Magnesium 1.4.  Magnesium sulfate 2 g IV today. 6.   No chemotherapy. 7.   RTC in 1 week for MD assessment, labs (CBC with diff, CMP, Mg), and cycle #11 FOLFOX chemotherapy.  I discussed the assessment and treatment plan with the patient.  The patient was provided an opportunity to ask questions and all were answered.  The patient agreed with the plan and demonstrated an understanding of the instructions.  The patient was advised to call back if the symptoms worsen or if the condition fails to improve as anticipated.   Lequita Asal, MD, PhD    12/10/2019, 9:04 AM  I, Mirian Mo Tufford, am acting as Education administrator for [employer]. Mike Gip, MD, PhD.  I, Cheris Tweten C. Mike Gip, MD, have reviewed the above documentation for accuracy and completeness, and I agree with the above.

## 2019-12-10 ENCOUNTER — Inpatient Hospital Stay (HOSPITAL_BASED_OUTPATIENT_CLINIC_OR_DEPARTMENT_OTHER): Payer: Medicare Other | Admitting: Hematology and Oncology

## 2019-12-10 ENCOUNTER — Encounter: Payer: Self-pay | Admitting: Hematology and Oncology

## 2019-12-10 ENCOUNTER — Other Ambulatory Visit: Payer: Self-pay

## 2019-12-10 ENCOUNTER — Inpatient Hospital Stay: Payer: Medicare Other

## 2019-12-10 VITALS — BP 143/67 | HR 83 | Temp 97.6°F | Resp 19 | Wt 268.0 lb

## 2019-12-10 DIAGNOSIS — C786 Secondary malignant neoplasm of retroperitoneum and peritoneum: Secondary | ICD-10-CM

## 2019-12-10 DIAGNOSIS — D701 Agranulocytosis secondary to cancer chemotherapy: Secondary | ICD-10-CM

## 2019-12-10 DIAGNOSIS — Z5111 Encounter for antineoplastic chemotherapy: Secondary | ICD-10-CM | POA: Diagnosis not present

## 2019-12-10 DIAGNOSIS — G620 Drug-induced polyneuropathy: Secondary | ICD-10-CM | POA: Diagnosis not present

## 2019-12-10 DIAGNOSIS — T451X5A Adverse effect of antineoplastic and immunosuppressive drugs, initial encounter: Secondary | ICD-10-CM

## 2019-12-10 DIAGNOSIS — M11279 Other chondrocalcinosis, unspecified ankle and foot: Secondary | ICD-10-CM

## 2019-12-10 DIAGNOSIS — Z7189 Other specified counseling: Secondary | ICD-10-CM

## 2019-12-10 DIAGNOSIS — C184 Malignant neoplasm of transverse colon: Secondary | ICD-10-CM | POA: Diagnosis not present

## 2019-12-10 LAB — COMPREHENSIVE METABOLIC PANEL
ALT: 43 U/L (ref 0–44)
AST: 53 U/L — ABNORMAL HIGH (ref 15–41)
Albumin: 3.3 g/dL — ABNORMAL LOW (ref 3.5–5.0)
Alkaline Phosphatase: 57 U/L (ref 38–126)
Anion gap: 8 (ref 5–15)
BUN: 11 mg/dL (ref 8–23)
CO2: 25 mmol/L (ref 22–32)
Calcium: 9.1 mg/dL (ref 8.9–10.3)
Chloride: 104 mmol/L (ref 98–111)
Creatinine, Ser: 0.76 mg/dL (ref 0.61–1.24)
GFR calc Af Amer: >60 mL/min (ref >60)
GFR calc non Af Amer: >60 mL/min (ref >60)
Glucose, Bld: 169 mg/dL — ABNORMAL HIGH (ref 70–99)
Potassium: 3.4 mmol/L — ABNORMAL LOW (ref 3.5–5.1)
Sodium: 137 mmol/L (ref 135–145)
Total Bilirubin: 1 mg/dL (ref 0.3–1.2)
Total Protein: 7 g/dL (ref 6.5–8.1)

## 2019-12-10 LAB — CBC WITH DIFFERENTIAL/PLATELET
Abs Immature Granulocytes: 0.01 10*3/uL (ref 0.00–0.07)
Basophils Absolute: 0 10*3/uL (ref 0.0–0.1)
Basophils Relative: 0 %
Eosinophils Absolute: 0 10*3/uL (ref 0.0–0.5)
Eosinophils Relative: 1 %
HCT: 35.1 % — ABNORMAL LOW (ref 39.0–52.0)
Hemoglobin: 11.6 g/dL — ABNORMAL LOW (ref 13.0–17.0)
Immature Granulocytes: 0 %
Lymphocytes Relative: 41 %
Lymphs Abs: 1.1 10*3/uL (ref 0.7–4.0)
MCH: 31 pg (ref 26.0–34.0)
MCHC: 33 g/dL (ref 30.0–36.0)
MCV: 93.9 fL (ref 80.0–100.0)
Monocytes Absolute: 0.2 10*3/uL (ref 0.1–1.0)
Monocytes Relative: 9 %
Neutro Abs: 1.3 10*3/uL — ABNORMAL LOW (ref 1.7–7.7)
Neutrophils Relative %: 49 %
Platelets: 149 10*3/uL — ABNORMAL LOW (ref 150–400)
RBC: 3.74 MIL/uL — ABNORMAL LOW (ref 4.22–5.81)
RDW: 14.5 % (ref 11.5–15.5)
WBC: 2.6 10*3/uL — ABNORMAL LOW (ref 4.0–10.5)
nRBC: 0 % (ref 0.0–0.2)

## 2019-12-10 LAB — MAGNESIUM: Magnesium: 1.4 mg/dL — ABNORMAL LOW (ref 1.7–2.4)

## 2019-12-10 MED ORDER — SODIUM CHLORIDE 0.9 % IV SOLN
Freq: Once | INTRAVENOUS | Status: AC
  Filled 2019-12-10: qty 250

## 2019-12-10 MED ORDER — HEPARIN SOD (PORK) LOCK FLUSH 100 UNIT/ML IV SOLN
INTRAVENOUS | Status: AC
  Filled 2019-12-10: qty 5

## 2019-12-10 MED ORDER — MAGNESIUM SULFATE 2 GM/50ML IV SOLN
2.0000 g | Freq: Once | INTRAVENOUS | Status: AC
  Administered 2019-12-10: 2 g via INTRAVENOUS
  Filled 2019-12-10: qty 50

## 2019-12-10 MED ORDER — HEPARIN SOD (PORK) LOCK FLUSH 100 UNIT/ML IV SOLN
500.0000 [IU] | Freq: Once | INTRAVENOUS | Status: AC | PRN
  Administered 2019-12-10: 500 [IU]
  Filled 2019-12-10: qty 5

## 2019-12-10 MED ORDER — SODIUM CHLORIDE 0.9% FLUSH
10.0000 mL | Freq: Once | INTRAVENOUS | Status: AC
  Administered 2019-12-10: 10 mL via INTRAVENOUS
  Filled 2019-12-10: qty 10

## 2019-12-10 NOTE — Progress Notes (Signed)
Patient reports some mild diarrhea

## 2019-12-11 LAB — CEA: CEA: 9.2 ng/mL — ABNORMAL HIGH (ref 0.0–4.7)

## 2019-12-12 ENCOUNTER — Inpatient Hospital Stay: Payer: Medicare Other

## 2019-12-16 NOTE — Progress Notes (Signed)
[facility]  [address], [address] [place], [place] [address] Phone: [phone]  Fax: [phone]   Clinic Day:  12/17/2019  Referring physician: Tracie Harrier, MD  Chief Complaint: Jared Gutierrez is a 79 y.o. male with metastaticcolon cancerwho is seen for assessment prior to cycle #11 FOLFOX chemotherapy.  HPI: The patient was last seen in the medical oncology clinic on 12/10/2019. At that time, he felt "pretty good".  Hematocrit was 35.1, hemoglobin 11.6, MCV 93.9, platelets 149,000, WBC 2,600 (ANC 1300). Potassium was 3.4. CEA was 9.2. Magnesium was 1.4.  CT scans revealed a mild decrease in the size of the soft tissue mass within the upper abdomen (2.2 cm).  Chemotherapy was held secondary to leukopenia.  He received 2 g IV magnesium.  During the interim, he has been "pretty good." His feet are a little sore at times and he has tingling under his fingernails. Denies nausea, vomiting, and diarrhea.   Past Medical History:  Diagnosis Date  . Arthritis    OSTEOARTHRITIS  . Cancer ([place])   . Cavitary lesion of lung    RIGHT LOWER LOBE  . Chicken pox   . Colon cancer ([place])   . History of kidney stones   . Hypertension   . Lipoma of colon   . Nephrolithiasis   . Nephrolithiasis   . Obesity   . Shingles   . Tubular adenoma of colon    multiple fragments    Past Surgical History:  Procedure Laterality Date  . COLON SURGERY    . COLONOSCOPY N/A 10/02/2014   Procedure: COLONOSCOPY;  Surgeon: Josefine Class, MD;  Location: [facility] ENDOSCOPY;  Service: Endoscopy;  Laterality: N/A;  . COLONOSCOPY WITH PROPOFOL N/A 02/16/2017   Procedure: COLONOSCOPY WITH PROPOFOL;  Surgeon: Manya Silvas, MD;  Location: [facility] ENDOSCOPY;  Service: Endoscopy;  Laterality: N/A;  . EUS N/A 04/17/2019   Procedure: FULL UPPER ENDOSCOPIC ULTRASOUND (EUS) RADIAL;  Surgeon: Holly Bodily, MD;  Location: [facility] ENDOSCOPY;  Service: Gastroenterology;  Laterality:  N/A;  . KIDNEY STONE SURGERY    . PARTIAL COLECTOMY  10/17/2013  . PORTACATH PLACEMENT Right 06/13/2019   Procedure: INSERTION PORT-A-CATH;  Surgeon: Benjamine Sprague, DO;  Location: [facility] ORS;  Service: General;  Laterality: Right;    Family History  Problem Relation Age of Onset  . Cancer Mother   . Breast cancer Mother   . COPD Father     Social History:  reports that he has never smoked. He has never used smokeless tobacco. He reports current alcohol use. He reports that he does not use drugs. 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He is married and his wife's name is [name] [phone]).The patient is alone today.  Allergies: No Known Allergies  Current Medications: Current Outpatient Medications  Medication Sig Dispense Refill  . amLODipine (NORVASC) 2.5 MG tablet Take 2.5 mg by mouth daily.    [name] aspirin EC 81 MG tablet Take 81 mg by mouth daily.    . Cholecalciferol (VITAMIN D) 125 MCG (5000 UT) CAPS Take 5,000 mg by mouth.    . docusate sodium ([employer] STOOL SOFTENER) 100 MG capsule Take 100 mg by mouth 2 (two) times daily.    [name] HYDROcodone-acetaminophen (NORCO/VICODIN) 5-325 MG tablet Take 1 tablet by mouth every 6 (six) hours as needed for moderate pain (up to 3 doses for moderate pain.).     [name] lidocaine-prilocaine (EMLA) cream Apply to affected area  once 30 g 3  . loratadine (CLARITIN) 10 MG tablet Take 10 mg by mouth daily.    [name] olmesartan (BENICAR) 20 MG tablet Take 20 mg by mouth daily.    . potassium chloride SA (KLOR-CON M20) 20 MEQ tablet Take by mouth.    . Probiotic Product (PROBIOTIC DAILY PO) Take 1 tablet by mouth daily.    . cephALEXin (KEFLEX) 500 MG capsule Take 500 mg by mouth 3 (three) times daily. (Patient not taking: Reported on 11/12/2019)    . ibuprofen (ADVIL) 800 MG tablet Take 800 mg by mouth every 8 (eight) hours as needed. (Patient not taking: Reported on 11/26/2019)    . ondansetron (ZOFRAN) 8 MG tablet Take 1 tablet (8 mg  total) by mouth 2 (two) times daily as needed for refractory nausea / vomiting. Start on day 3 after chemotherapy. (Patient not taking: Reported on 11/26/2019) 30 tablet 1   No current facility-administered medications for this visit.   Facility-Administered Medications Ordered in Other Visits  Medication Dose Route Frequency Provider Last Rate Last Admin  . 0.9 %  sodium chloride infusion   Intravenous Once Corcoran, Melissa C, MD      . 0.9 %  sodium chloride infusion   Intravenous Continuous Lequita Asal, MD 10 mL/hr at 12/31/19 1000 New Bag at 12/31/19 1000  . sodium chloride flush (NS) 0.9 % injection 10 mL  10 mL Intracatheter PRN Lequita Asal, MD   10 mL at 10/03/19 1354    Review of Systems  Constitutional: Positive for weight loss (3 lbs). Negative for chills, diaphoresis, fever and malaise/fatigue.       Feels "pretty good".  HENT: Negative for congestion, ear discharge, ear pain, hearing loss, nosebleeds, sinus pain, sore throat and tinnitus.   Eyes: Negative for blurred vision.  Respiratory: Negative for cough, hemoptysis, sputum production and shortness of breath.   Cardiovascular: Negative for chest pain, palpitations and leg swelling.  Gastrointestinal: Negative for abdominal pain, blood in stool, constipation, diarrhea, heartburn, melena, nausea and vomiting.  Genitourinary: Negative for dysuria, frequency, hematuria and urgency.  Musculoskeletal: Negative for back pain, joint pain (chronic bilateral knee pain), myalgias and neck pain.       Foot soreness.  Skin: Negative for itching and rash.  Neurological: Positive for sensory change (neuropathy in finger tips). Negative for dizziness, tingling, weakness and headaches.       Ambulating with cane  Endo/Heme/Allergies: Does not bruise/bleed easily.  Psychiatric/Behavioral: Negative for depression and memory loss. The patient is not nervous/anxious and does not have insomnia.   All other systems reviewed and are  negative.  Performance status (ECOG): 1  Vitals Blood pressure 124/79, pulse 86, temperature (!) 97.2 F (36.2 C), temperature source Tympanic, resp. rate 18, weight 265 lb 3.4 oz (120.3 kg), SpO2 100 %.   Physical Exam Vitals and nursing note reviewed.  Constitutional:      General: He is not in acute distress.    Appearance: Normal appearance. He is well-developed. He is not diaphoretic.     Interventions: Face mask in place.     Comments: He has a cane by his side.  HENT:     Head: Normocephalic and atraumatic.     Comments: Male pattern baldness.  Gray beard.    Mouth/Throat:     Mouth: Mucous membranes are moist.     Pharynx: Oropharynx is clear.  Eyes:     General: No scleral icterus.    Extraocular Movements: Extraocular movements  intact.     Conjunctiva/sclera: Conjunctivae normal.     Pupils: Pupils are equal, round, and reactive to light.  Cardiovascular:     Rate and Rhythm: Normal rate and regular rhythm.     Heart sounds: Normal heart sounds. No murmur heard.   Pulmonary:     Effort: Pulmonary effort is normal. No respiratory distress.     Breath sounds: Normal breath sounds. No wheezing or rales.  Chest:     Chest wall: No tenderness.  Abdominal:     General: Bowel sounds are normal. There is no distension.     Palpations: Abdomen is soft. There is no mass.     Tenderness: There is no abdominal tenderness. There is no guarding or rebound.  Musculoskeletal:        General: No swelling or tenderness. Normal range of motion.     Cervical back: Normal range of motion and neck supple.  Lymphadenopathy:     Head:     Right side of head: No preauricular, posterior auricular or occipital adenopathy.     Left side of head: No preauricular, posterior auricular or occipital adenopathy.     Cervical: No cervical adenopathy.     Upper Body:     Right upper body: No supraclavicular or axillary adenopathy.     Left upper body: No supraclavicular or axillary adenopathy.      Lower Body: No right inguinal adenopathy. No left inguinal adenopathy.  Skin:    General: Skin is warm and dry.  Neurological:     Mental Status: He is alert and oriented to person, place, and time. Mental status is at baseline.  Psychiatric:        Mood and Affect: Mood normal.        Behavior: Behavior normal.        Thought Content: Thought content normal.        Judgment: Judgment normal.    Appointment on 12/17/2019  Component Date Value Ref Range Status  . Magnesium 12/17/2019 1.6* 1.7 - 2.4 mg/dL Final   Performed at [facility], [address], [place], [place] [address]  . Sodium 12/17/2019 137  135 - 145 mmol/L Final  . Potassium 12/17/2019 3.8  3.5 - 5.1 mmol/L Final  . Chloride 12/17/2019 103  98 - 111 mmol/L Final  . CO2 12/17/2019 26  22 - 32 mmol/L Final  . Glucose, Bld 12/17/2019 215* 70 - 99 mg/dL Final   Glucose reference range applies only to samples taken after fasting for at least 8 hours.  . BUN 12/17/2019 12  8 - 23 mg/dL Final  . Creatinine, Ser 12/17/2019 0.79  0.61 - 1.24 mg/dL Final  . Calcium 12/17/2019 9.5  8.9 - 10.3 mg/dL Final  . Total Protein 12/17/2019 7.6  6.5 - 8.1 g/dL Final  . Albumin 12/17/2019 3.3* 3.5 - 5.0 g/dL Final  . AST 12/17/2019 43* 15 - 41 U/L Final  . ALT 12/17/2019 31  0 - 44 U/L Final  . Alkaline Phosphatase 12/17/2019 62  38 - 126 U/L Final  . Total Bilirubin 12/17/2019 0.6  0.3 - 1.2 mg/dL Final  . GFR calc non Af Amer 12/17/2019 >60  >60 mL/min Final  . GFR calc Af Amer 12/17/2019 >60  >60 mL/min Final  . Anion gap 12/17/2019 8  5 - 15 Final   Performed at [facility], [address] [place], [place] [address]  . WBC 12/17/2019 5.4  4.0 - 10.5  K/uL Final  . RBC 12/17/2019 3.97* 4.22 - 5.81 MIL/uL Final  . Hemoglobin 12/17/2019 12.4* 13.0 - 17.0 g/dL Final  . HCT 12/17/2019 37.8* 39 - 52 % Final  . MCV 12/17/2019 95.2  80.0 - 100.0 fL Final  . MCH 12/17/2019 31.2  26.0 - 34.0 pg Final  .  MCHC 12/17/2019 32.8  30.0 - 36.0 g/dL Final  . RDW 12/17/2019 14.5  11.5 - 15.5 % Final  . Platelets 12/17/2019 263  150 - 400 K/uL Final  . nRBC 12/17/2019 0.0  0.0 - 0.2 % Final  . Neutrophils Relative % 12/17/2019 48  % Final  . Neutro Abs 12/17/2019 2.6  1.7 - 7.7 K/uL Final  . Lymphocytes Relative 12/17/2019 35  % Final  . Lymphs Abs 12/17/2019 1.9  0.7 - 4.0 K/uL Final  . Monocytes Relative 12/17/2019 13  % Final  . Monocytes Absolute 12/17/2019 0.7  0 - 1 K/uL Final  . Eosinophils Relative 12/17/2019 1  % Final  . Eosinophils Absolute 12/17/2019 0.0  0 - 0 K/uL Final  . Basophils Relative 12/17/2019 1  % Final  . Basophils Absolute 12/17/2019 0.0  0 - 0 K/uL Final  . Immature Granulocytes 12/17/2019 2  % Final  . Abs Immature Granulocytes 12/17/2019 0.11* 0.00 - 0.07 K/uL Final   Performed at [facility], [address], [place], [place] [address]    Assessment:  ARIEZ NEILAN is a 79 y.o. male  withmetastatic colon cancer. He was diagnosed with stage I colon cancers/p transverse colectomy on 10/17/2013. 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 Tumor extended into the submucosa. Margins were negative. There was lymphovascular invasion. 14 lymph nodes were negative.Pathologic stagewas T1 N0.  Colonoscopyon 10/02/2014 noted several 3 mm polyps and an 8 mm polyp in the cecum and transverse colon. Pathology revealed tubular adenomas negative for high-grade dysplasia and malignancy. Colonoscopyon 02/16/2017 revealed 2 diminutive polyps in the descending colon. Pathology revealed tubular adenomas without dysplasia or malignancy.  CEAhas been followed: 3.1 on 04/27/2014, 2.3 on 11/11/2014, 3.0 on 05/12/2015, 2.7 on 11/10/2015, 3.2 on 05/10/2016, 2.8 on 11/01/2016, 3.3 on 04/25/2017, 3.1 on 10/31/2017, 4.0 on 05/01/2018, 7.0on 11/28/2018, 7.4 on 12/13/2018,  10.5 on 03/25/2019, 17.8on 06/25/2019, 16.2 on 07/09/2019, 13.8 on 07/23/2019, 11.9 on 07/30/2019, 6.1 on 08/27/2019, 6.1 on 09/10/2019, 6.4 on 10/01/2019, 6.9 on 10/15/2019, 7.7 on 10/29/2019, and 9.0 on 11/26/2019.  Chest, abdomen and pelvis CTon 12/20/2018 revealed postoperative findings of transverse colon resection without evidence of recurrent mass, definite lymphadenopathy, or distant metastatic disease to explain rising CEA. Therewereprominent sub-centimeter retroperitoneal and iliac lymph nodes, uncertain significance. There were postoperative findings about the left renal hilum, of uncertain nature, with a broad-based postoperative fat containing left-sided lumbar hernia.There was bibasilar bronchiectasis and scarring with multiple post infectious or inflammatory pneumatoceles, unchanged in comparison to CT date 06/23/2011.  PET scanon 04/02/2019 revealed a2.4 x 2.3 cm (SUV 11)hypermetabolic soft tissue density caudal and anterior to the pancreatic neckfavored to represent isolated peritoneal or nodal metastasis in the setting of prior transverse colonic resection (expected primary drainage). Although this was immediately adjacent to the pancreas, a fat plane was maintained, arguing strongly against a pancreatic primary. Otherwise,there wasno evidence of hypermetabolic metastasis.  Upper endoscopic ultrasoundon 04/17/2019 revealeda normal esophagus, stomach, duodenum, and pancreas.There was a 2.4 x 2.4 cm irregularmassin the retroperitoneum adjacent to, but not involving the pancreatic neck. FNA and core needle  biopsy were performed. Pathologyrevealed adenocarcinoma compatible with a metastatic lesion of colorectal origin. Tumor cells were positive for CK20 and CDX2andnegative for CK7.  Omniseqon 05/15/2019 revealed + KRAS and TP53. Negative results included BRAF V600E, Her2, NRAS, NTRK, PD-L1 (<1%), and TMB 8.7/Mb (intermediate). MMR testingfrom his colon  resection on 10/16/2013 was intact with a low probability of MSI-H.  He is s/p cycle #10FOLFOXchemotherapy (07/09/2019- 08/27/2019; 09/17/2019 - 10/15/2019; 11/12/2019 - 11/26/2019).He received Neulasta after cycle #4 and #5 secondary to progressive leukopenia.  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  He received Janey Genta on day 8 of cycle #8.  Abdomen and pelvis CT on 10/09/2019 revealed a 2.4 x 2.3 cm lobulated nodule anterior to the pancreatic neck, previously FDG PET avid, not changed in size although decreased in internal attenuation suggesting treatment response. There was no evidence of new metastatic disease in the abdomen or pelvis.  There were unchanged prominent subcentimeter retroperitoneal and celiac axis lymph nodes, not previously FDG avid and nonspecific. There were postoperative findings about the left renal hilum with a broad-based left-sided lumbar hernia and hepatic steatosis.  Chest, abdomen and pelvic CT on 12/05/2019 revealed mild decrease in size of previous FDG avid soft tissue attenuating lesion within the upper abdomen just inferior and anterior to the pancreatic neck (2.2 cm in maximum dimension vs 2.4 cm previously).  There was no new or progressive disease.  He has chronic right lower extremity edema. Right lower extremity duplexon 12/24/2018 revealed no DVT. He has a small right Baker's cyst.Bilateral lower extremityduplexon 08/16/2019 revealed no evidence of deep venous thrombosis in either lower extremity. Increased pulsatility of the venous waveforms bilaterally. These findings suggestedelevated right heart pressures. Differential considerations includedtricuspid regurgitation, right heart failure, pulmonary arterial hypertension and chronic COPD.  Hewas admitted to [facility] 08/16/2019 -08/17/2019 with right lower extremitycellulitis.He was unable to bear weight. Hewas treated withIV fluids, NSAIDs, colchicine,and broad antibiotics  (vancomycin and Cefepime).He was discharged onindomethacin x 5 daysand Keflex 500 mg TID x 5 days.  He has received his COVID-19 vaccine.  Symptomatically, he feels "pretty good".  He feels a little tingling at the tip of his fingertips.  He denies any nausea, vomiting or diarrhea.  Plan: 1.   Labs today: CBC with diff, CMP, magnesium. 2. Metastaticcolon cancer Clinically, he is doing well.  Exam is unremarkable. He initially presented with stage I disease s/p resection. PET scan on 04/02/2019 revealeda2.4 x 2.3 cm (SUV 11)soft tissue density caudal and anterior to the pancreatic neck. 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 Lesion was not resectable. NGS revealed no targetable mutation. MMR on original resection revealed a a low probability of MSI-H. He declined Avastin. He is s/p 10 cycles of  FOLFOX chemotherapy. He required Neulasta with cycle #4 and 5.  He subsequently developed pseudogout.   He received Zarxio with cycle #8 and subsequent pseudogout with a peak WBC of 20,800.   Continue limited Zarxio support based on nadir WBC. Chest, abdomen and pelvis CT on 12/05/2019 was personally reviewed.  Agree with radiology interpretation.   The FDG avid mass in the upper abdomen is slightly smaller (2.2 cm).   There is no new disease.  Discuss plans for 12 cycles of chemotherapy then either local therapy or maintenance chemotherapy (Xeloda). Discuss symptom management.  He has antiemetics and pain medications at home to use on a prn bases.  Interventions are adequate.  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  Last episode resolved on Keflex and indomethacin.  No gout following Zarxio was more self-limited likely  secondary to decrease peak in WBC.  Continue to limit growth factor support. 4. Chemotherapy induced neuropathy  Patient has a stable grade I-II neuropathy in his fingertips.  Neuropathy is not affecting function.  Continue to monitor on reduced dose oxaliplatin. 5.   Hypomagnesemia  Magnesium is 1.6.    Magnesium 2 g IV.   6.   Cycle #11 FOLFOX chemotherapy today. 7.   RTC in 2 days for pump disconnect. 8.   RTC on 08/12 for labs (CBC with diff) +/- Zarxio. 9.   RTC in 2 weeks for MD assessment, labs (CBC with diff, CMP, Mg, CEA), and cycle #12 FOLFOX.  I discussed the assessment and treatment plan with the patient.  The patient was provided an opportunity to ask questions and all were answered.  The patient agreed with the plan and demonstrated an understanding of the instructions.  The patient was advised to call back if the symptoms worsen or if the condition fails to improve as anticipated.   Lequita Asal, MD, PhD    12/17/2019, 8:37 AM  I, Mirian Mo Tufford, am acting as Education administrator for [employer]. Mike Gip, MD, PhD.  I, Melissa C. Mike Gip, MD, have reviewed the above documentation for accuracy and completeness, and I agree with the above.

## 2019-12-17 ENCOUNTER — Encounter: Payer: Self-pay | Admitting: Hematology and Oncology

## 2019-12-17 ENCOUNTER — Inpatient Hospital Stay: Payer: Medicare Other | Attending: Hematology and Oncology

## 2019-12-17 ENCOUNTER — Inpatient Hospital Stay (HOSPITAL_BASED_OUTPATIENT_CLINIC_OR_DEPARTMENT_OTHER): Payer: Medicare Other | Admitting: Hematology and Oncology

## 2019-12-17 ENCOUNTER — Inpatient Hospital Stay: Payer: Medicare Other

## 2019-12-17 ENCOUNTER — Other Ambulatory Visit: Payer: Self-pay

## 2019-12-17 VITALS — BP 175/95 | HR 66 | Temp 97.4°F | Resp 18

## 2019-12-17 VITALS — BP 124/79 | HR 86 | Temp 97.2°F | Resp 18 | Wt 265.2 lb

## 2019-12-17 DIAGNOSIS — M109 Gout, unspecified: Secondary | ICD-10-CM | POA: Diagnosis not present

## 2019-12-17 DIAGNOSIS — C184 Malignant neoplasm of transverse colon: Secondary | ICD-10-CM

## 2019-12-17 DIAGNOSIS — Z5111 Encounter for antineoplastic chemotherapy: Secondary | ICD-10-CM | POA: Diagnosis not present

## 2019-12-17 DIAGNOSIS — T451X5A Adverse effect of antineoplastic and immunosuppressive drugs, initial encounter: Secondary | ICD-10-CM | POA: Diagnosis not present

## 2019-12-17 DIAGNOSIS — E669 Obesity, unspecified: Secondary | ICD-10-CM | POA: Diagnosis not present

## 2019-12-17 DIAGNOSIS — Z7982 Long term (current) use of aspirin: Secondary | ICD-10-CM | POA: Diagnosis not present

## 2019-12-17 DIAGNOSIS — Z85038 Personal history of other malignant neoplasm of large intestine: Secondary | ICD-10-CM

## 2019-12-17 DIAGNOSIS — C786 Secondary malignant neoplasm of retroperitoneum and peritoneum: Secondary | ICD-10-CM | POA: Diagnosis not present

## 2019-12-17 DIAGNOSIS — G620 Drug-induced polyneuropathy: Secondary | ICD-10-CM | POA: Diagnosis not present

## 2019-12-17 DIAGNOSIS — T451X5D Adverse effect of antineoplastic and immunosuppressive drugs, subsequent encounter: Secondary | ICD-10-CM

## 2019-12-17 DIAGNOSIS — Z79899 Other long term (current) drug therapy: Secondary | ICD-10-CM | POA: Diagnosis not present

## 2019-12-17 DIAGNOSIS — T7840XA Allergy, unspecified, initial encounter: Secondary | ICD-10-CM

## 2019-12-17 DIAGNOSIS — Z791 Long term (current) use of non-steroidal anti-inflammatories (NSAID): Secondary | ICD-10-CM | POA: Diagnosis not present

## 2019-12-17 DIAGNOSIS — I10 Essential (primary) hypertension: Secondary | ICD-10-CM | POA: Diagnosis not present

## 2019-12-17 DIAGNOSIS — Z803 Family history of malignant neoplasm of breast: Secondary | ICD-10-CM | POA: Diagnosis not present

## 2019-12-17 LAB — COMPREHENSIVE METABOLIC PANEL
ALT: 31 U/L (ref 0–44)
AST: 43 U/L — ABNORMAL HIGH (ref 15–41)
Albumin: 3.3 g/dL — ABNORMAL LOW (ref 3.5–5.0)
Alkaline Phosphatase: 62 U/L (ref 38–126)
Anion gap: 8 (ref 5–15)
BUN: 12 mg/dL (ref 8–23)
CO2: 26 mmol/L (ref 22–32)
Calcium: 9.5 mg/dL (ref 8.9–10.3)
Chloride: 103 mmol/L (ref 98–111)
Creatinine, Ser: 0.79 mg/dL (ref 0.61–1.24)
GFR calc Af Amer: >60 mL/min (ref >60)
GFR calc non Af Amer: >60 mL/min (ref >60)
Glucose, Bld: 215 mg/dL — ABNORMAL HIGH (ref 70–99)
Potassium: 3.8 mmol/L (ref 3.5–5.1)
Sodium: 137 mmol/L (ref 135–145)
Total Bilirubin: 0.6 mg/dL (ref 0.3–1.2)
Total Protein: 7.6 g/dL (ref 6.5–8.1)

## 2019-12-17 LAB — CBC WITH DIFFERENTIAL/PLATELET
Abs Immature Granulocytes: 0.11 10*3/uL — ABNORMAL HIGH (ref 0.00–0.07)
Basophils Absolute: 0 10*3/uL (ref 0.0–0.1)
Basophils Relative: 1 %
Eosinophils Absolute: 0 10*3/uL (ref 0.0–0.5)
Eosinophils Relative: 1 %
HCT: 37.8 % — ABNORMAL LOW (ref 39.0–52.0)
Hemoglobin: 12.4 g/dL — ABNORMAL LOW (ref 13.0–17.0)
Immature Granulocytes: 2 %
Lymphocytes Relative: 35 %
Lymphs Abs: 1.9 10*3/uL (ref 0.7–4.0)
MCH: 31.2 pg (ref 26.0–34.0)
MCHC: 32.8 g/dL (ref 30.0–36.0)
MCV: 95.2 fL (ref 80.0–100.0)
Monocytes Absolute: 0.7 10*3/uL (ref 0.1–1.0)
Monocytes Relative: 13 %
Neutro Abs: 2.6 10*3/uL (ref 1.7–7.7)
Neutrophils Relative %: 48 %
Platelets: 263 10*3/uL (ref 150–400)
RBC: 3.97 MIL/uL — ABNORMAL LOW (ref 4.22–5.81)
RDW: 14.5 % (ref 11.5–15.5)
WBC: 5.4 10*3/uL (ref 4.0–10.5)
nRBC: 0 % (ref 0.0–0.2)

## 2019-12-17 LAB — MAGNESIUM: Magnesium: 1.6 mg/dL — ABNORMAL LOW (ref 1.7–2.4)

## 2019-12-17 MED ORDER — DEXTROSE 5 % IV SOLN
Freq: Once | INTRAVENOUS | Status: AC
  Filled 2019-12-17: qty 250

## 2019-12-17 MED ORDER — LEUCOVORIN CALCIUM INJECTION 350 MG
1000.0000 mg | Freq: Once | INTRAVENOUS | Status: AC
  Administered 2019-12-17: 1000 mg via INTRAVENOUS
  Filled 2019-12-17: qty 50

## 2019-12-17 MED ORDER — MAGNESIUM SULFATE 2 GM/50ML IV SOLN
2.0000 g | Freq: Once | INTRAVENOUS | Status: AC
  Administered 2019-12-17: 2 g via INTRAVENOUS
  Filled 2019-12-17: qty 50

## 2019-12-17 MED ORDER — METHYLPREDNISOLONE SODIUM SUCC 125 MG IJ SOLR
125.0000 mg | Freq: Once | INTRAMUSCULAR | Status: AC | PRN
  Administered 2019-12-17: 125 mg via INTRAVENOUS

## 2019-12-17 MED ORDER — FLUOROURACIL CHEMO INJECTION 2.5 GM/50ML
1000.0000 mg | Freq: Once | INTRAVENOUS | Status: AC
  Administered 2019-12-17: 1000 mg via INTRAVENOUS
  Filled 2019-12-17: qty 20

## 2019-12-17 MED ORDER — PALONOSETRON HCL INJECTION 0.25 MG/5ML
0.2500 mg | Freq: Once | INTRAVENOUS | Status: AC
  Administered 2019-12-17: 0.25 mg via INTRAVENOUS
  Filled 2019-12-17: qty 5

## 2019-12-17 MED ORDER — OXALIPLATIN CHEMO INJECTION 100 MG/20ML
65.0000 mg/m2 | Freq: Once | INTRAVENOUS | Status: AC
  Administered 2019-12-17: 160 mg via INTRAVENOUS
  Filled 2019-12-17: qty 20

## 2019-12-17 MED ORDER — DIPHENHYDRAMINE HCL 50 MG/ML IJ SOLN
25.0000 mg | Freq: Once | INTRAMUSCULAR | Status: AC
  Administered 2019-12-17: 25 mg via INTRAVENOUS

## 2019-12-17 MED ORDER — SODIUM CHLORIDE 0.9 % IV SOLN
10.0000 mg | Freq: Once | INTRAVENOUS | Status: AC
  Administered 2019-12-17: 10 mg via INTRAVENOUS
  Filled 2019-12-17: qty 10

## 2019-12-17 MED ORDER — DIPHENHYDRAMINE HCL 50 MG/ML IJ SOLN
25.0000 mg | Freq: Once | INTRAMUSCULAR | Status: AC | PRN
  Administered 2019-12-17: 25 mg via INTRAVENOUS

## 2019-12-17 MED ORDER — SODIUM CHLORIDE 0.9 % IV SOLN
5900.0000 mg | INTRAVENOUS | Status: AC
  Administered 2019-12-17: 5900 mg via INTRAVENOUS
  Filled 2019-12-17: qty 118

## 2019-12-17 NOTE — Progress Notes (Signed)
Patient here for oncology follow-up appointment, expresses concerns of chronic numbness in fingers, otherwise no other complaints.

## 2019-12-17 NOTE — Progress Notes (Signed)
1305 pt up to BR, upon return to seat pt c/o back pain and chills, Chemotherapy stopped, Corcoran notified and at chair side, benadyrl 25mg  given IVP, solumedrol 125mg  given IVP, BP 186/99, HR 68, afebrile 1325 benadyrl 25mg  given IVP as ordered, BP 175/95 states pain is better now from a 5 to a 1-2. Will continue to monitor patient. 1349 162/93, 68, 18, sats 100% RA,

## 2019-12-19 ENCOUNTER — Inpatient Hospital Stay: Payer: Medicare Other | Attending: Hematology and Oncology

## 2019-12-19 ENCOUNTER — Other Ambulatory Visit: Payer: Self-pay

## 2019-12-19 VITALS — BP 145/67 | HR 93 | Temp 96.0°F | Resp 18

## 2019-12-19 DIAGNOSIS — C786 Secondary malignant neoplasm of retroperitoneum and peritoneum: Secondary | ICD-10-CM

## 2019-12-19 DIAGNOSIS — C189 Malignant neoplasm of colon, unspecified: Secondary | ICD-10-CM | POA: Diagnosis present

## 2019-12-19 DIAGNOSIS — Z85038 Personal history of other malignant neoplasm of large intestine: Secondary | ICD-10-CM

## 2019-12-19 MED ORDER — SODIUM CHLORIDE 0.9% FLUSH
10.0000 mL | INTRAVENOUS | Status: DC | PRN
  Administered 2019-12-19: 10 mL
  Filled 2019-12-19: qty 10

## 2019-12-19 MED ORDER — HEPARIN SOD (PORK) LOCK FLUSH 100 UNIT/ML IV SOLN
500.0000 [IU] | Freq: Once | INTRAVENOUS | Status: AC | PRN
  Administered 2019-12-19: 500 [IU]
  Filled 2019-12-19: qty 5

## 2019-12-19 MED ORDER — HEPARIN SOD (PORK) LOCK FLUSH 100 UNIT/ML IV SOLN
INTRAVENOUS | Status: AC
  Filled 2019-12-19: qty 5

## 2019-12-20 DIAGNOSIS — T451X5A Adverse effect of antineoplastic and immunosuppressive drugs, initial encounter: Secondary | ICD-10-CM | POA: Insufficient documentation

## 2019-12-20 DIAGNOSIS — D701 Agranulocytosis secondary to cancer chemotherapy: Secondary | ICD-10-CM | POA: Insufficient documentation

## 2019-12-25 ENCOUNTER — Other Ambulatory Visit: Payer: Self-pay

## 2019-12-25 ENCOUNTER — Inpatient Hospital Stay: Payer: Medicare Other

## 2019-12-25 DIAGNOSIS — T451X5A Adverse effect of antineoplastic and immunosuppressive drugs, initial encounter: Secondary | ICD-10-CM

## 2019-12-25 DIAGNOSIS — C184 Malignant neoplasm of transverse colon: Secondary | ICD-10-CM

## 2019-12-25 DIAGNOSIS — Z5111 Encounter for antineoplastic chemotherapy: Secondary | ICD-10-CM

## 2019-12-25 DIAGNOSIS — G620 Drug-induced polyneuropathy: Secondary | ICD-10-CM

## 2019-12-25 DIAGNOSIS — C786 Secondary malignant neoplasm of retroperitoneum and peritoneum: Secondary | ICD-10-CM

## 2019-12-25 LAB — CBC WITH DIFFERENTIAL/PLATELET
Abs Immature Granulocytes: 0.05 10*3/uL (ref 0.00–0.07)
Basophils Absolute: 0 10*3/uL (ref 0.0–0.1)
Basophils Relative: 1 %
Eosinophils Absolute: 0.1 10*3/uL (ref 0.0–0.5)
Eosinophils Relative: 1 %
HCT: 37.2 % — ABNORMAL LOW (ref 39.0–52.0)
Hemoglobin: 12.1 g/dL — ABNORMAL LOW (ref 13.0–17.0)
Immature Granulocytes: 1 %
Lymphocytes Relative: 27 %
Lymphs Abs: 2.3 10*3/uL (ref 0.7–4.0)
MCH: 30.7 pg (ref 26.0–34.0)
MCHC: 32.5 g/dL (ref 30.0–36.0)
MCV: 94.4 fL (ref 80.0–100.0)
Monocytes Absolute: 0.4 10*3/uL (ref 0.1–1.0)
Monocytes Relative: 5 %
Neutro Abs: 5.8 10*3/uL (ref 1.7–7.7)
Neutrophils Relative %: 65 %
Platelets: 171 10*3/uL (ref 150–400)
RBC: 3.94 MIL/uL — ABNORMAL LOW (ref 4.22–5.81)
RDW: 13.8 % (ref 11.5–15.5)
WBC: 8.7 10*3/uL (ref 4.0–10.5)
nRBC: 0 % (ref 0.0–0.2)

## 2019-12-29 NOTE — Progress Notes (Signed)
[facility]  [address] [place], [place] [address] Phone: [phone]  Fax: [phone]   Clinic Day:  12/31/2019  Referring physician: Tracie Harrier, MD  Chief Complaint: Jared Gutierrez is a 79 y.o. male with metastaticcolon cancerwho is seen for assessment prior to cycle #12 5-fluorouracil and leucovorin chemotherapy.  HPI: The patient was last seen in the medical oncology clinic on 12/17/2019. At that time, he felt "pretty good".  He noted that his feet were sore at times.  He described a little tingling under his fingernails. Hematocrit was 37.8, hemoglobin 12.4, MCV 95.2, platelets 263,000, WBC 5,400. Blood sugar was 215. Albumin was 3.3. Magnesium was 1.6. He received 2 g IV magnesium and Cycle #11 FOLFOX.  During his oxaliplatin infusion, he developed back pain and chills.  He developed hypertension.  Chemotherapy was stopped.  He received Benadryl and Solemedrol.  After resolutation of his symptoms, he completed treatment without oxaliplatin.  CBC on 12/25/2019 revealed a hematocrit of 37.2, hemoglobin 12.1, MCV 94.4, platelets 171,000, WBC 8700 with an [place] of 5800.  During the interim, he has had a "good week." The neuropathy in his fingers and his knee pain are the same.  He denies feet soreness.  He denies any nausea, vomiting or diarrhea.   Past Medical History:  Diagnosis Date  . Arthritis    OSTEOARTHRITIS  . Cancer ([place])   . Cavitary lesion of lung    RIGHT LOWER LOBE  . Chicken pox   . Colon cancer ([place])   . History of kidney stones   . Hypertension   . Lipoma of colon   . Nephrolithiasis   . Nephrolithiasis   . Obesity   . Shingles   . Tubular adenoma of colon    multiple fragments    Past Surgical History:  Procedure Laterality Date  . COLON SURGERY    . COLONOSCOPY N/A 10/02/2014   Procedure: COLONOSCOPY;  Surgeon: Josefine Class, MD;  Location: [facility] ENDOSCOPY;  Service: Endoscopy;  Laterality: N/A;  .  COLONOSCOPY WITH PROPOFOL N/A 02/16/2017   Procedure: COLONOSCOPY WITH PROPOFOL;  Surgeon: Manya Silvas, MD;  Location: [facility] ENDOSCOPY;  Service: Endoscopy;  Laterality: N/A;  . EUS N/A 04/17/2019   Procedure: FULL UPPER ENDOSCOPIC ULTRASOUND (EUS) RADIAL;  Surgeon: Holly Bodily, MD;  Location: [facility] ENDOSCOPY;  Service: Gastroenterology;  Laterality: N/A;  . KIDNEY STONE SURGERY    . PARTIAL COLECTOMY  10/17/2013  . PORTACATH PLACEMENT Right 06/13/2019   Procedure: INSERTION PORT-A-CATH;  Surgeon: Benjamine Sprague, DO;  Location: [facility] ORS;  Service: General;  Laterality: Right;    Family History  Problem Relation Age of Onset  . Cancer Mother   . Breast cancer Mother   . COPD Father     Social History:  reports that he has never smoked. He has never used smokeless tobacco. He reports current alcohol use. He reports that he does not use drugs. 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He is married and his wife's name is [name] [phone]).The patient is alone today.  Allergies: No Known Allergies  Current Medications: Current Outpatient Medications  Medication Sig Dispense Refill  . amLODipine (NORVASC) 2.5 MG tablet Take 2.5 mg by mouth daily.    [name] aspirin EC 81 MG tablet Take 81 mg by mouth daily.    . Cholecalciferol (VITAMIN D) 125 MCG (5000 UT) CAPS Take 5,000 mg by mouth.    . docusate  sodium ([employer] STOOL SOFTENER) 100 MG capsule Take 100 mg by mouth 2 (two) times daily.    [name] HYDROcodone-acetaminophen (NORCO/VICODIN) 5-325 MG tablet Take 1 tablet by mouth every 6 (six) hours as needed for moderate pain (up to 3 doses for moderate pain.).     [name] lidocaine-prilocaine (EMLA) cream Apply to affected area once 30 g 3  . loratadine (CLARITIN) 10 MG tablet Take 10 mg by mouth daily.    [name] olmesartan (BENICAR) 20 MG tablet Take 20 mg by mouth daily.    . potassium chloride SA (KLOR-CON M20) 20 MEQ tablet Take by mouth.    . Probiotic Product (PROBIOTIC  DAILY PO) Take 1 tablet by mouth daily.    . cephALEXin (KEFLEX) 500 MG capsule Take 500 mg by mouth 3 (three) times daily. (Patient not taking: Reported on 11/12/2019)    . ibuprofen (ADVIL) 800 MG tablet Take 800 mg by mouth every 8 (eight) hours as needed. (Patient not taking: Reported on 11/26/2019)    . ondansetron (ZOFRAN) 8 MG tablet Take 1 tablet (8 mg total) by mouth 2 (two) times daily as needed for refractory nausea / vomiting. Start on day 3 after chemotherapy. (Patient not taking: Reported on 11/26/2019) 30 tablet 1   No current facility-administered medications for this visit.   Facility-Administered Medications Ordered in Other Visits  Medication Dose Route Frequency Provider Last Rate Last Admin  . 0.9 %  sodium chloride infusion   Intravenous Once Austine Kelsay C, MD      . 0.9 %  sodium chloride infusion   Intravenous Continuous Lequita Asal, MD 10 mL/hr at 12/31/19 1000 New Bag at 12/31/19 1000  . sodium chloride flush (NS) 0.9 % injection 10 mL  10 mL Intracatheter PRN Lequita Asal, MD   10 mL at 10/03/19 1354    Review of Systems  Constitutional: Negative for chills, diaphoresis, fever, malaise/fatigue and weight loss (stable).       Feels "good".  HENT: Negative for congestion, ear discharge, ear pain, hearing loss, nosebleeds, sinus pain, sore throat and tinnitus.   Eyes: Negative for blurred vision.  Respiratory: Negative for cough, hemoptysis, sputum production and shortness of breath.   Cardiovascular: Negative for chest pain, palpitations and leg swelling.  Gastrointestinal: Negative for abdominal pain, blood in stool, constipation, diarrhea, heartburn, melena, nausea and vomiting.  Genitourinary: Negative for dysuria, frequency, hematuria and urgency.  Musculoskeletal: Negative for back pain, joint pain (chronic bilateral knee pain), myalgias and neck pain.  Skin: Negative for itching and rash.  Neurological: Positive for sensory change (neuropathy  in finger tips). Negative for dizziness, tingling, weakness and headaches.       Ambulating with cane  Endo/Heme/Allergies: Does not bruise/bleed easily.  Psychiatric/Behavioral: Negative for depression and memory loss. The patient is not nervous/anxious and does not have insomnia.   All other systems reviewed and are negative.  Performance status (ECOG): 1  Vitals Blood pressure 123/62, pulse 95, temperature 97.7 F (36.5 C), temperature source Tympanic, resp. rate 18, weight 265 lb (120.2 kg), SpO2 99 %.   Physical Exam Vitals and nursing note reviewed.  Constitutional:      General: He is not in acute distress.    Appearance: Normal appearance. He is well-developed. He is not diaphoretic.     Interventions: Face mask in place.     Comments: He has a cane by his side.  HENT:     Head: Normocephalic and atraumatic.     Comments: Pearline Cables  hair and beard.    Mouth/Throat:     Mouth: Mucous membranes are moist.     Pharynx: Oropharynx is clear.  Eyes:     General: No scleral icterus.    Extraocular Movements: Extraocular movements intact.     Conjunctiva/sclera: Conjunctivae normal.     Pupils: Pupils are equal, round, and reactive to light.  Cardiovascular:     Rate and Rhythm: Normal rate and regular rhythm.     Heart sounds: Normal heart sounds. No murmur heard.   Pulmonary:     Effort: Pulmonary effort is normal. No respiratory distress.     Breath sounds: Normal breath sounds. No wheezing or rales.  Chest:     Chest wall: No tenderness.  Abdominal:     General: Bowel sounds are normal. There is no distension.     Palpations: Abdomen is soft. There is no mass.     Tenderness: There is no abdominal tenderness. There is no guarding or rebound.  Musculoskeletal:        General: No swelling or tenderness. Normal range of motion.     Cervical back: Normal range of motion and neck supple.     Right lower leg: No edema.     Left lower leg: No edema.  Lymphadenopathy:     Head:      Right side of head: No preauricular, posterior auricular or occipital adenopathy.     Left side of head: No preauricular, posterior auricular or occipital adenopathy.     Cervical: No cervical adenopathy.     Upper Body:     Right upper body: No supraclavicular or axillary adenopathy.     Left upper body: No supraclavicular or axillary adenopathy.     Lower Body: No right inguinal adenopathy. No left inguinal adenopathy.  Skin:    General: Skin is warm and dry.  Neurological:     Mental Status: He is alert and oriented to person, place, and time. Mental status is at baseline.  Psychiatric:        Mood and Affect: Mood normal.        Behavior: Behavior normal.        Thought Content: Thought content normal.        Judgment: Judgment normal.    Infusion on 12/31/2019  Component Date Value Ref Range Status  . WBC 12/31/2019 3.6* 4.0 - 10.5 K/uL Final  . RBC 12/31/2019 3.79* 4.22 - 5.81 MIL/uL Final  . Hemoglobin 12/31/2019 11.8* 13.0 - 17.0 g/dL Final  . HCT 12/31/2019 35.6* 39 - 52 % Final  . MCV 12/31/2019 93.9  80.0 - 100.0 fL Final  . MCH 12/31/2019 31.1  26.0 - 34.0 pg Final  . MCHC 12/31/2019 33.1  30.0 - 36.0 g/dL Final  . RDW 12/31/2019 14.1  11.5 - 15.5 % Final  . Platelets 12/31/2019 159  150 - 400 K/uL Final  . nRBC 12/31/2019 0.0  0.0 - 0.2 % Final  . Neutrophils Relative % 12/31/2019 57  % Final  . Neutro Abs 12/31/2019 2.0  1.7 - 7.7 K/uL Final  . Lymphocytes Relative 12/31/2019 32  % Final  . Lymphs Abs 12/31/2019 1.2  0.7 - 4.0 K/uL Final  . Monocytes Relative 12/31/2019 8  % Final  . Monocytes Absolute 12/31/2019 0.3  0 - 1 K/uL Final  . Eosinophils Relative 12/31/2019 2  % Final  . Eosinophils Absolute 12/31/2019 0.1  0 - 0 K/uL Final  . Basophils Relative 12/31/2019 1  %  Final  . Basophils Absolute 12/31/2019 0.0  0 - 0 K/uL Final  . Immature Granulocytes 12/31/2019 0  % Final  . Abs Immature Granulocytes 12/31/2019 0.01  0.00 - 0.07 K/uL Final    Performed at [facility], [address], [place], [place] [address]  . Magnesium 12/31/2019 1.5* 1.7 - 2.4 mg/dL Final   Performed at [facility], [address] [place], [place] [address]  . Sodium 12/31/2019 138  135 - 145 mmol/L Final  . Potassium 12/31/2019 3.7  3.5 - 5.1 mmol/L Final  . Chloride 12/31/2019 105  98 - 111 mmol/L Final  . CO2 12/31/2019 25  22 - 32 mmol/L Final  . Glucose, Bld 12/31/2019 209* 70 - 99 mg/dL Final   Glucose reference range applies only to samples taken after fasting for at least 8 hours.  . BUN 12/31/2019 12  8 - 23 mg/dL Final  . Creatinine, Ser 12/31/2019 0.78  0.61 - 1.24 mg/dL Final  . Calcium 12/31/2019 9.3  8.9 - 10.3 mg/dL Final  . Total Protein 12/31/2019 7.2  6.5 - 8.1 g/dL Final  . Albumin 12/31/2019 3.5  3.5 - 5.0 g/dL Final  . AST 12/31/2019 54* 15 - 41 U/L Final  . ALT 12/31/2019 48* 0 - 44 U/L Final  . Alkaline Phosphatase 12/31/2019 55  38 - 126 U/L Final  . Total Bilirubin 12/31/2019 0.8  0.3 - 1.2 mg/dL Final  . GFR calc non Af Amer 12/31/2019 >60  >60 mL/min Final  . GFR calc Af Amer 12/31/2019 >60  >60 mL/min Final  . Anion gap 12/31/2019 8  5 - 15 Final   Performed at [facility], [address] [place], [place] [address]  . CEA 12/31/2019 10.8* 0.0 - 4.7 ng/mL Final   Comment: (NOTE)                             Nonsmokers          <3.9                             Smokers             <5.6 [employer] Electrochemiluminescence Immunoassay (ECLIA) Values obtained with different assay methods or kits cannot be used interchangeably.  Results cannot be interpreted as absolute evidence of the presence or absence of malignant disease. Performed At: [facility] [address] [place], [place] [id number] Rush Farmer MD KG:[id number]     Assessment:  Jared Gutierrez is a 79 y.o. male  withmetastatic colon cancer. He was diagnosed with stage I colon cancers/p transverse  colectomy on 10/17/2013. 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 Tumor extended into the submucosa. Margins were negative. There was lymphovascular invasion. 14 lymph nodes were negative.Pathologic stagewas T1 N0.  Colonoscopyon 10/02/2014 noted several 3 mm polyps and an 8 mm polyp in the cecum and transverse colon. Pathology revealed tubular adenomas negative for high-grade dysplasia and malignancy. Colonoscopyon 02/16/2017 revealed 2 diminutive polyps in the descending colon. Pathology revealed tubular adenomas without dysplasia or malignancy.  CEAhas been followed: 3.1 on 04/27/2014, 2.3 on 11/11/2014, 3.0 on 05/12/2015, 2.7 on 11/10/2015, 3.2 on 05/10/2016, 2.8 on 11/01/2016, 3.3 on 04/25/2017, 3.1 on 10/31/2017, 4.0 on 05/01/2018, 7.0on 11/28/2018, 7.4 on 12/13/2018, 10.5 on 03/25/2019, 17.8on 06/25/2019,  16.2 on 07/09/2019, 13.8 on 07/23/2019, 11.9 on 07/30/2019, 6.1 on 08/27/2019, 6.1 on 09/10/2019, 6.4 on 10/01/2019, 6.9 on 10/15/2019, 7.7 on 10/29/2019, 9.0 on 11/26/2019, 9.2 on 12/10/2019 and 10.8 on 12/31/2019.  Chest, abdomen and pelvis CTon 12/20/2018 revealed postoperative findings of transverse colon resection without evidence of recurrent mass, definite lymphadenopathy, or distant metastatic disease to explain rising CEA. Therewereprominent sub-centimeter retroperitoneal and iliac lymph nodes, uncertain significance. There were postoperative findings about the left renal hilum, of uncertain nature, with a broad-based postoperative fat containing left-sided lumbar hernia.There was bibasilar bronchiectasis and scarring with multiple post infectious or inflammatory pneumatoceles, unchanged in comparison to CT date 06/23/2011.  PET scanon 04/02/2019 revealed a2.4 x 2.3 cm (SUV 11)hypermetabolic soft tissue density caudal and anterior to the pancreatic neckfavored to  represent isolated peritoneal or nodal metastasis in the setting of prior transverse colonic resection (expected primary drainage). Although this was immediately adjacent to the pancreas, a fat plane was maintained, arguing strongly against a pancreatic primary. Otherwise,there wasno evidence of hypermetabolic metastasis.  Upper endoscopic ultrasoundon 04/17/2019 revealeda normal esophagus, stomach, duodenum, and pancreas.There was a 2.4 x 2.4 cm irregularmassin the retroperitoneum adjacent to, but not involving the pancreatic neck. FNA and core needle biopsy were performed. Pathologyrevealed adenocarcinoma compatible with a metastatic lesion of colorectal origin. Tumor cells were positive for CK20 and CDX2andnegative for CK7.  Omniseqon 05/15/2019 revealed + KRAS and TP53. Negative results included BRAF V600E, Her2, NRAS, NTRK, PD-L1 (<1%), and TMB 8.7/Mb (intermediate). MMR testingfrom his colon resection on 10/16/2013 was intact with a low probability of MSI-H.  He is s/p 10 cycles ofFOLFOXchemotherapy (07/09/2019- 08/27/2019; 09/17/2019 - 10/15/2019; 11/12/2019 - 12/17/2019) and 1 cycle of 5-FU and leucovorin.He received Neulasta after cycle #4 and #5 secondary to progressive leukopenia.  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  He received Janey Genta on day 8 of cycle #8.  He received a truncated course of FOLFOX with cycle #11 secondary to oxaliplatin reaction.  Abdomen and pelvis CT on 10/09/2019 revealed a 2.4 x 2.3 cm lobulated nodule anterior to the pancreatic neck, previously FDG PET avid, not changed in size although decreased in internal attenuation suggesting treatment response. There was no evidence of new metastatic disease in the abdomen or pelvis.  There were unchanged prominent subcentimeter retroperitoneal and celiac axis lymph nodes, not previously FDG avid and nonspecific. There were postoperative findings about the left renal hilum with a broad-based  left-sided lumbar hernia and hepatic steatosis.  Chest, abdomen and pelvic CT on 12/05/2019 revealed mild decrease in size of previous FDG avid soft tissue attenuating lesion within the upper abdomen just inferior and anterior to the pancreatic neck (2.2 cm in maximum dimension vs 2.4 cm previously).  There was no new or progressive disease.  He has chronic right lower extremity edema. Right lower extremity duplexon 12/24/2018 revealed no DVT. He has a small right Baker's cyst.Bilateral lower extremityduplexon 08/16/2019 revealed no evidence of deep venous thrombosis in either lower extremity. Increased pulsatility of the venous waveforms bilaterally. These findings suggestedelevated right heart pressures. Differential considerations includedtricuspid regurgitation, right heart failure, pulmonary arterial hypertension and chronic COPD.  Hewas admitted to [place] 08/16/2019 -08/17/2019 with right lower extremitycellulitis.He was unable to bear weight. Hewas treated withIV fluids, NSAIDs, colchicine,and broad antibiotics (vancomycin and Cefepime).He was discharged onindomethacin x 5 daysand Keflex 500 mg TID x 5 days.  He received the Moderna COVID-19 vaccine on 06/07/2019 and 07/05/2019.  Symptomatically, he feels good.  Neuropathy is stable.  He denies any foot pain.  He denies any GI symptoms.  Exam is unremarkable.  Plan: 1.   Labs today: CBC with diff, CMP, Mg, CEA. 2. Metastaticcolon cancer Clinically, he is doing well.  Exam is unremarkable. He initially presented with stage I disease s/p resection. PET scan on 04/02/2019 revealeda2.4 x 2.3 cm (SUV 11)soft tissue density caudal and anterior to the pancreatic neck. 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 Lesion was not resectable. NGS revealed no targetable mutation. MMR on  original resection revealed a a low probability of MSI-H. He declined Avastin. He is s/p 10 cycles of FOLFOX chemotherapy and 1 cycle of 5-FU and leucovorin secondary to an oxaliplatin reaction. He is tolerating chemotherapy well with only a mild neuropathy. CEA is 10.8.  Discuss plan for 12 cycles of chemotherapy then maintenance 5-FU/Xeloda or possible localized therapy (XRT).   Discuss Xeloda 2 weeks on and 1 week off.    Potential side effects reviewed.  Present at tumor board on 01/08/2020.  Discuss symptom management.  He has antiemetics at home to use on a prn bases.  Interventions are adequate.    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  Limit use of Zarxio if growth factor support needed to limit peak WBC (associated with pseudogout).    Continue to monitor. 4. 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  Neuropathy is not affecting function.  Continue to monitor off oxaliplatin. 5.   Cycle #12 5FU + LV today. 6.   RTC in 2 days for pump disconnect. 7.   RTC in 1 week for labs (CBC with diff)- show MD and +/- Zarxio. 8.   Preauth Xeloda. 9.   Tumor board 01/08/2020. 10.   RTC in 2 weeks for MD assessment, labs (CBC with diff, CMP, Mg) and +/- initiation of cycle #1 Xeloda.  Addendum: Patient presented at tumor board on 01/08/2020.  Decision was made to proceed with PET scan and if there remains an isolated lesion in the retroperitoneum, he will be referred to radiation oncology for SBRT.  I discussed the assessment and treatment plan with the patient.  The patient was provided an opportunity to ask questions and all were answered.  The patient agreed with the plan and demonstrated an understanding of the instructions.  The patient was advised to call back if the symptoms worsen or if the condition fails  to improve as anticipated.   Lequita Asal, MD, PhD    12/31/2019, 9:16 PM  I, Mirian Mo Tufford, am acting as Education administrator for [employer]. Mike Gip, MD, PhD.  I, Mandeep Kiser C. Mike Gip, MD, have reviewed the above documentation for accuracy and completeness, and I agree with the above.

## 2019-12-31 ENCOUNTER — Other Ambulatory Visit: Payer: Self-pay

## 2019-12-31 ENCOUNTER — Inpatient Hospital Stay: Payer: Medicare Other

## 2019-12-31 ENCOUNTER — Inpatient Hospital Stay (HOSPITAL_BASED_OUTPATIENT_CLINIC_OR_DEPARTMENT_OTHER): Payer: Medicare Other | Admitting: Hematology and Oncology

## 2019-12-31 ENCOUNTER — Telehealth: Payer: Self-pay

## 2019-12-31 VITALS — BP 123/62 | HR 95 | Temp 97.7°F | Resp 18 | Wt 265.0 lb

## 2019-12-31 DIAGNOSIS — Z5111 Encounter for antineoplastic chemotherapy: Secondary | ICD-10-CM | POA: Diagnosis not present

## 2019-12-31 DIAGNOSIS — C184 Malignant neoplasm of transverse colon: Secondary | ICD-10-CM

## 2019-12-31 DIAGNOSIS — T451X5D Adverse effect of antineoplastic and immunosuppressive drugs, subsequent encounter: Secondary | ICD-10-CM | POA: Diagnosis not present

## 2019-12-31 DIAGNOSIS — T451X5A Adverse effect of antineoplastic and immunosuppressive drugs, initial encounter: Secondary | ICD-10-CM

## 2019-12-31 DIAGNOSIS — G620 Drug-induced polyneuropathy: Secondary | ICD-10-CM | POA: Diagnosis not present

## 2019-12-31 DIAGNOSIS — C786 Secondary malignant neoplasm of retroperitoneum and peritoneum: Secondary | ICD-10-CM

## 2019-12-31 DIAGNOSIS — Z85038 Personal history of other malignant neoplasm of large intestine: Secondary | ICD-10-CM

## 2019-12-31 LAB — CBC WITH DIFFERENTIAL/PLATELET
Abs Immature Granulocytes: 0.01 10*3/uL (ref 0.00–0.07)
Basophils Absolute: 0 10*3/uL (ref 0.0–0.1)
Basophils Relative: 1 %
Eosinophils Absolute: 0.1 10*3/uL (ref 0.0–0.5)
Eosinophils Relative: 2 %
HCT: 35.6 % — ABNORMAL LOW (ref 39.0–52.0)
Hemoglobin: 11.8 g/dL — ABNORMAL LOW (ref 13.0–17.0)
Immature Granulocytes: 0 %
Lymphocytes Relative: 32 %
Lymphs Abs: 1.2 10*3/uL (ref 0.7–4.0)
MCH: 31.1 pg (ref 26.0–34.0)
MCHC: 33.1 g/dL (ref 30.0–36.0)
MCV: 93.9 fL (ref 80.0–100.0)
Monocytes Absolute: 0.3 10*3/uL (ref 0.1–1.0)
Monocytes Relative: 8 %
Neutro Abs: 2 10*3/uL (ref 1.7–7.7)
Neutrophils Relative %: 57 %
Platelets: 159 10*3/uL (ref 150–400)
RBC: 3.79 MIL/uL — ABNORMAL LOW (ref 4.22–5.81)
RDW: 14.1 % (ref 11.5–15.5)
WBC: 3.6 10*3/uL — ABNORMAL LOW (ref 4.0–10.5)
nRBC: 0 % (ref 0.0–0.2)

## 2019-12-31 LAB — COMPREHENSIVE METABOLIC PANEL
ALT: 48 U/L — ABNORMAL HIGH (ref 0–44)
AST: 54 U/L — ABNORMAL HIGH (ref 15–41)
Albumin: 3.5 g/dL (ref 3.5–5.0)
Alkaline Phosphatase: 55 U/L (ref 38–126)
Anion gap: 8 (ref 5–15)
BUN: 12 mg/dL (ref 8–23)
CO2: 25 mmol/L (ref 22–32)
Calcium: 9.3 mg/dL (ref 8.9–10.3)
Chloride: 105 mmol/L (ref 98–111)
Creatinine, Ser: 0.78 mg/dL (ref 0.61–1.24)
GFR calc Af Amer: >60 mL/min (ref >60)
GFR calc non Af Amer: >60 mL/min (ref >60)
Glucose, Bld: 209 mg/dL — ABNORMAL HIGH (ref 70–99)
Potassium: 3.7 mmol/L (ref 3.5–5.1)
Sodium: 138 mmol/L (ref 135–145)
Total Bilirubin: 0.8 mg/dL (ref 0.3–1.2)
Total Protein: 7.2 g/dL (ref 6.5–8.1)

## 2019-12-31 LAB — MAGNESIUM: Magnesium: 1.5 mg/dL — ABNORMAL LOW (ref 1.7–2.4)

## 2019-12-31 MED ORDER — PALONOSETRON HCL INJECTION 0.25 MG/5ML
0.2500 mg | Freq: Once | INTRAVENOUS | Status: AC
  Administered 2019-12-31: 0.25 mg via INTRAVENOUS
  Filled 2019-12-31: qty 5

## 2019-12-31 MED ORDER — FLUOROURACIL CHEMO INJECTION 2.5 GM/50ML
1000.0000 mg | Freq: Once | INTRAVENOUS | Status: AC
  Administered 2019-12-31: 1000 mg via INTRAVENOUS
  Filled 2019-12-31: qty 20

## 2019-12-31 MED ORDER — SODIUM CHLORIDE 0.9 % IV SOLN
INTRAVENOUS | Status: AC
  Filled 2019-12-31 (×2): qty 250

## 2019-12-31 MED ORDER — MAGNESIUM SULFATE 2 GM/50ML IV SOLN
2.0000 g | Freq: Once | INTRAVENOUS | Status: AC
  Administered 2019-12-31: 2 g via INTRAVENOUS
  Filled 2019-12-31: qty 50

## 2019-12-31 MED ORDER — SODIUM CHLORIDE 0.9 % IV SOLN
10.0000 mg | Freq: Once | INTRAVENOUS | Status: AC
  Administered 2019-12-31: 10 mg via INTRAVENOUS
  Filled 2019-12-31: qty 10

## 2019-12-31 MED ORDER — LEUCOVORIN CALCIUM INJECTION 350 MG
1000.0000 mg | Freq: Once | INTRAVENOUS | Status: AC
  Administered 2019-12-31: 1000 mg via INTRAVENOUS
  Filled 2019-12-31: qty 50

## 2019-12-31 MED ORDER — SODIUM CHLORIDE 0.9 % IV SOLN
5900.0000 mg | INTRAVENOUS | Status: AC
  Administered 2019-12-31: 5900 mg via INTRAVENOUS
  Filled 2019-12-31: qty 118

## 2019-12-31 NOTE — Patient Instructions (Signed)
Capecitabine tablets What is this medicine? CAPECITABINE (ka pe SITE a been) is a chemotherapy drug. It slows the growth of cancer cells. This medicine is used to treat breast cancer, and also colon or rectal cancer. This medicine may be used for other purposes; ask your health care provider or pharmacist if you have questions. COMMON BRAND NAME(S): Xeloda What should I tell my health care provider before I take this medicine? They need to know if you have any of these conditions:  bleeding disorders  dehydration  dihydropyrimidine dehydrogenase (DPD) deficiency  heart disease  infection (especially a virus infection such as chickenpox, cold sores, or herpes)  kidney disease  liver disease  low blood counts, like low white cell, platelet, or red cell counts  an unusual or allergic reaction to capecitabine, fluorouracil, other medicines, foods, dyes, or preservatives  pregnant or trying to get pregnant  breast-feeding How should I use this medicine? Take this medicine by mouth with a glass of water, within 30 minutes of the end of a meal. Do not cut, crush or chew this medicine. Follow the directions on the prescription label. Take your medicine at regular intervals. Do not take it more often than directed. Do not stop taking except on your doctor's advice. Your doctor may want you to take a combination of 150 mg and 500 mg tablets for each dose. It is very important that you know how to correctly take your dose. Taking the wrong tablets could result in an overdose (too much medication) or underdose (too little medication). Talk to your pediatrician regarding the use of this medicine in children. Special care may be needed. Overdosage: If you think you have taken too much of this medicine contact a poison control center or emergency room at once. NOTE: This medicine is only for you. Do not share this medicine with others. What if I miss a dose? If you miss a dose, do not take  the missed dose at all. Do not take double or extra doses. Instead, continue with your next scheduled dose and check with your doctor. What may interact with this medicine? This medicine may interact with the following medications:  allopurinol  leucovorin  phenytoin  warfarin This list may not describe all possible interactions. Give your health care provider a list of all the medicines, herbs, non-prescription drugs, or dietary supplements you use. Also tell them if you smoke, drink alcohol, or use illegal drugs. Some items may interact with your medicine. What should I watch for while using this medicine? Visit your doctor for checks on your progress. This drug may make you feel generally unwell. This is not uncommon, as chemotherapy can affect healthy cells as well as cancer cells. Report any side effects. Continue your course of treatment even though you feel ill unless your doctor tells you to stop. In some cases, you may be given additional medicines to help with side effects. Follow all directions for their use. Call your doctor or health care professional for advice if you get a fever, chills or sore throat, or other symptoms of a cold or flu. Do not treat yourself. This drug decreases your body's ability to fight infections. Try to avoid being around people who are sick. This medicine may increase your risk to bruise or bleed. Call your doctor or health care professional if you notice any unusual bleeding. Be careful brushing and flossing your teeth or using a toothpick because you may get an infection or bleed more easily. If  you have any dental work done, tell your dentist you are receiving this medicine. Avoid taking products that contain aspirin, acetaminophen, ibuprofen, naproxen, or ketoprofen unless instructed by your doctor. These medicines may hide a fever. Do not become pregnant while taking this medicine or for 6 months after stopping it. Women should inform their doctor if  they wish to become pregnant or think they might be pregnant. There is a potential for serious side effects to an unborn child. Talk to your health care professional or pharmacist for more information. Do not breast-feed an infant while taking this medicine or for 2 weeks after stopping it. Men are advised not to father a child while taking this medicine or for 3 months after stopping it. This medicine may make it more difficult to get pregnant or father a child. Talk with your doctor or health care professional if you are concerned about your fertility. What side effects may I notice from receiving this medicine? Side effects that you should report to your doctor or health care professional as soon as possible:  allergic reactions like skin rash, itching or hives, swelling of the face, lips, or tongue  diarrhea  low blood counts - this medicine may decrease the number of white blood cells, red blood cells and platelets. You may be at increased risk for infections and bleeding  nausea, vomiting  redness, blistering, peeling or loosening of the skin, including inside the mouth (this can be added for any serious or exfoliative rash that could lead to hospitalization)  redness, swelling, or sores on hands or feet  signs and symptoms of kidney injury like trouble passing urine or change in the amount of urine  signs and symptoms of liver injury like dark yellow or brown urine; general ill feeling or flu-like symptoms; light-colored stools; loss of appetite; nausea; right upper belly pain; unusually weak or tired; yellowing of the eyes or skin  signs of decreased platelets or bleeding - bruising, pinpoint red spots on the skin, black, tarry stools, blood in the urine  signs of decreased red blood cells - unusually weak or tired, fainting spells, lightheadedness  signs of infection - fever or chills, cough, sore throat, pain or difficulty passing urine Side effects that usually do not require  medical attention (report to your doctor or health care professional if they continue or are bothersome):  changes in vision  constipation  loss of appetite  mouth sores  pain, tingling, numbness in the hands or feet This list may not describe all possible side effects. Call your doctor for medical advice about side effects. You may report side effects to FDA at 1-800-FDA-1088. Where should I keep my medicine? Keep out of the reach of children. Store at room temperature between 15 and 30 degrees C (59 and 86 degrees F). Keep container tightly closed. Throw away any unused medicine after the expiration date. NOTE: This sheet is a summary. It may not cover all possible information. If you have questions about this medicine, talk to your doctor, pharmacist, or health care provider.  2020 [employer] Standard (2017-08-14 21:22:45)

## 2019-12-31 NOTE — Progress Notes (Signed)
Numbness and tingling is constant in fingertips. Has some in toes as well.

## 2019-12-31 NOTE — Progress Notes (Signed)
Oxaliplatin d/c per MD

## 2019-12-31 NOTE — Telephone Encounter (Signed)
Per Aleen Sells Chrismon, patient does not need Preauth for Xeloda. He is good to receive drug.

## 2020-01-01 LAB — CEA: CEA: 10.8 ng/mL — ABNORMAL HIGH (ref 0.0–4.7)

## 2020-01-02 ENCOUNTER — Inpatient Hospital Stay: Payer: Medicare Other

## 2020-01-02 ENCOUNTER — Other Ambulatory Visit: Payer: Self-pay

## 2020-01-02 VITALS — BP 121/66 | HR 77 | Temp 96.9°F | Resp 18

## 2020-01-02 DIAGNOSIS — C786 Secondary malignant neoplasm of retroperitoneum and peritoneum: Secondary | ICD-10-CM

## 2020-01-02 DIAGNOSIS — Z85038 Personal history of other malignant neoplasm of large intestine: Secondary | ICD-10-CM

## 2020-01-02 DIAGNOSIS — C189 Malignant neoplasm of colon, unspecified: Secondary | ICD-10-CM | POA: Diagnosis not present

## 2020-01-02 MED ORDER — HEPARIN SOD (PORK) LOCK FLUSH 100 UNIT/ML IV SOLN
500.0000 [IU] | Freq: Once | INTRAVENOUS | Status: AC | PRN
  Administered 2020-01-02: 500 [IU]
  Filled 2020-01-02: qty 5

## 2020-01-02 MED ORDER — SODIUM CHLORIDE 0.9% FLUSH
10.0000 mL | INTRAVENOUS | Status: DC | PRN
  Administered 2020-01-02: 10 mL
  Filled 2020-01-02: qty 10

## 2020-01-07 ENCOUNTER — Inpatient Hospital Stay: Payer: Medicare Other

## 2020-01-07 ENCOUNTER — Other Ambulatory Visit: Payer: Self-pay

## 2020-01-07 DIAGNOSIS — C786 Secondary malignant neoplasm of retroperitoneum and peritoneum: Secondary | ICD-10-CM

## 2020-01-07 DIAGNOSIS — T451X5A Adverse effect of antineoplastic and immunosuppressive drugs, initial encounter: Secondary | ICD-10-CM

## 2020-01-07 DIAGNOSIS — Z5111 Encounter for antineoplastic chemotherapy: Secondary | ICD-10-CM | POA: Diagnosis not present

## 2020-01-07 DIAGNOSIS — C184 Malignant neoplasm of transverse colon: Secondary | ICD-10-CM

## 2020-01-07 LAB — CBC WITH DIFFERENTIAL/PLATELET
Abs Immature Granulocytes: 0.07 10*3/uL (ref 0.00–0.07)
Basophils Absolute: 0 10*3/uL (ref 0.0–0.1)
Basophils Relative: 1 %
Eosinophils Absolute: 0 10*3/uL (ref 0.0–0.5)
Eosinophils Relative: 1 %
HCT: 37.5 % — ABNORMAL LOW (ref 39.0–52.0)
Hemoglobin: 12.4 g/dL — ABNORMAL LOW (ref 13.0–17.0)
Immature Granulocytes: 1 %
Lymphocytes Relative: 41 %
Lymphs Abs: 2 10*3/uL (ref 0.7–4.0)
MCH: 31.2 pg (ref 26.0–34.0)
MCHC: 33.1 g/dL (ref 30.0–36.0)
MCV: 94.5 fL (ref 80.0–100.0)
Monocytes Absolute: 0.5 10*3/uL (ref 0.1–1.0)
Monocytes Relative: 10 %
Neutro Abs: 2.3 10*3/uL (ref 1.7–7.7)
Neutrophils Relative %: 46 %
Platelets: 253 10*3/uL (ref 150–400)
RBC: 3.97 MIL/uL — ABNORMAL LOW (ref 4.22–5.81)
RDW: 13.9 % (ref 11.5–15.5)
WBC: 5 10*3/uL (ref 4.0–10.5)
nRBC: 0.4 % — ABNORMAL HIGH (ref 0.0–0.2)

## 2020-01-08 ENCOUNTER — Other Ambulatory Visit: Payer: Medicare Other

## 2020-01-09 ENCOUNTER — Other Ambulatory Visit: Payer: Self-pay | Admitting: Hematology and Oncology

## 2020-01-09 DIAGNOSIS — C189 Malignant neoplasm of colon, unspecified: Secondary | ICD-10-CM

## 2020-01-09 NOTE — Progress Notes (Signed)
Tumor Board Documentation  Jared Gutierrez was presented by Dr Mike Gip at our Tumor Board on 01/08/2020, which included representatives from medical oncology, radiation oncology, surgical oncology, internal medicine, navigation, pathology, radiology, surgical, pulmonology, research.  Jared Gutierrez currently presents as a current patient, for discussion with history of the following treatments: neoadjuvant chemotherapy.  Additionally, we reviewed previous medical and familial history, history of present illness, and recent lab results along with all available histopathologic and imaging studies. The tumor board considered available treatment options and made the following recommendations: Additional screening (PET Scan) Possible SBRT to be determined post PET Scan  The following procedures/referrals were also placed: No orders of the defined types were placed in this encounter.   Clinical Trial Status: not discussed   Staging used: AJCC Stage Group  AJCC Staging: T: 1 N: 0   Group: Stage I Metastatic Colon Cancer   National site-specific guidelines NCCN were discussed with respect to the case.  Tumor board is a meeting of clinicians from various specialty areas who evaluate and discuss patients for whom a multidisciplinary approach is being considered. Final determinations in the plan of care are those of the provider(s). The responsibility for follow up of recommendations given during tumor board is that of the provider.   Today's extended care, comprehensive team conference, Jared Gutierrez was not present for the discussion and was not examined.   Multidisciplinary Tumor Board is a multidisciplinary case peer review process.  Decisions discussed in the Multidisciplinary Tumor Board reflect the opinions of the specialists present at the conference without having examined the patient.  Ultimately, treatment and diagnostic decisions rest with the primary provider(s) and the patient.

## 2020-01-14 ENCOUNTER — Encounter
Admission: RE | Admit: 2020-01-14 | Discharge: 2020-01-14 | Disposition: A | Discharge status: Home / Self Care | Payer: Medicare Other | Source: Ambulatory Visit | Attending: Hematology and Oncology | Admitting: Hematology and Oncology

## 2020-01-14 ENCOUNTER — Other Ambulatory Visit: Payer: Self-pay

## 2020-01-14 ENCOUNTER — Ambulatory Visit: Payer: Medicare Other | Admitting: Hematology and Oncology

## 2020-01-14 ENCOUNTER — Other Ambulatory Visit: Payer: Medicare Other

## 2020-01-14 DIAGNOSIS — I700 Atherosclerosis of aorta: Secondary | ICD-10-CM | POA: Insufficient documentation

## 2020-01-14 DIAGNOSIS — I2510 Atherosclerotic heart disease of native coronary artery without angina pectoris: Secondary | ICD-10-CM | POA: Diagnosis not present

## 2020-01-14 DIAGNOSIS — J439 Emphysema, unspecified: Secondary | ICD-10-CM | POA: Insufficient documentation

## 2020-01-14 DIAGNOSIS — C189 Malignant neoplasm of colon, unspecified: Secondary | ICD-10-CM | POA: Diagnosis not present

## 2020-01-14 LAB — GLUCOSE, CAPILLARY: Glucose-Capillary: 83 mg/dL (ref 70–99)

## 2020-01-14 MED ORDER — FLUDEOXYGLUCOSE F - 18 (FDG) INJECTION
13.7000 | Freq: Once | INTRAVENOUS | Status: AC | PRN
  Administered 2020-01-14: 13.37 via INTRAVENOUS

## 2020-01-14 NOTE — Progress Notes (Signed)
[facility]  [address] [place], [place] [address] Phone: [phone]  Fax: [phone]   Clinic Day: 01/15/2020  Referring physician: Tracie Harrier, MD  Chief Complaint: Jared Gutierrez is a 79 y.o. male with metastaticcolon cancerwho is seen for 2 week assessment, review of PET scan, and discussion regarding direction of therapy.  HPI: The patient was last seen in the medical oncology clinic on 12/31/2019. At that time, he feels good.  Neuropathy is stable.  He denies any foot pain.  He denies any GI symptoms.  Exam is unremarkable. Hematocrit was 35.6, hemoglobin 11.8, MCV 93.9, platelets 159,000, WBC 3,600 (ANC 2,000). Blood glucose was 209. AST was 54 and ALT was 48. Magnesium was 1.5. CEA was 10.8. He received cycle #12 5-fluorouracil and leucovorin chemotherapy.  We discussed either maintenance Xeloda or local therapy (SBRT).  Tumor Board on 01/08/2020 recommended additional screening (PET scan) and possible SBRT to be determined post PET scan.  PET scan on 01/14/2020 revealed an interval decrease in size and FDG uptake (2.4 x 2.3 cm with SUV 11 to 2.4 x 1.7 cm with SUV 4.09) associated with the previously referenced soft tissue density caudal and anterior to the pancreatic neck suggesting treatment response. There were no new sites of FDG avid tumor.  CBC on 01/07/2020 revealed a hematocrit of 37.5, hemoglobin 12.4, MCV 94.5, platelets 253,000, WBC 5,000.  During the interim, he has been good. The neuropathy in his hands is stable. He has some soreness on the top of his right foot. He denies foot swelling or redness. He denies any recent trauma or injury in the area.  The patient and his wife agree to a radiation oncology consult.   Past Medical History:  Diagnosis Date  . Arthritis    OSTEOARTHRITIS  . Cancer ([place])   . Cavitary lesion of lung    RIGHT LOWER LOBE  . Chicken pox   . Colon cancer ([place])   . History of kidney stones    . Hypertension   . Lipoma of colon   . Nephrolithiasis   . Nephrolithiasis   . Obesity   . Shingles   . Tubular adenoma of colon    multiple fragments    Past Surgical History:  Procedure Laterality Date  . COLON SURGERY    . COLONOSCOPY N/A 10/02/2014   Procedure: COLONOSCOPY;  Surgeon: Josefine Class, MD;  Location[facility] ENDOSCOPY;  Service: Endoscopy;  Laterality: N/A;  . COLONOSCOPY WITH PROPOFOL N/A 02/16/2017   Procedure: COLONOSCOPY WITH PROPOFOL;  Surgeon: Manya Silvas, MD;  Location: [facility] ENDOSCOPY;  Service: Endoscopy;  Laterality: N/A;  . EUS N/A 04/17/2019   Procedure: FULL UPPER ENDOSCOPIC ULTRASOUND (EUS) RADIAL;  Surgeon: Holly Bodily, MD;  Location: [facility] ENDOSCOPY;  Service: Gastroenterology;  Laterality: N/A;  . KIDNEY STONE SURGERY    . PARTIAL COLECTOMY  10/17/2013  . PORTACATH PLACEMENT Right 06/13/2019   Procedure: INSERTION PORT-A-CATH;  Surgeon: Benjamine Sprague, DO;  Location: [facility] ORS;  Service: General;  Laterality: Right;    Family History  Problem Relation Age of Onset  . Cancer Mother   . Breast cancer Mother   . COPD Father     Social History:  reports that he has never smoked. He has never used smokeless tobacco. He reports current alcohol use. He reports that he does not use drugs. 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He is married and his wife's name  is [name] [phone]).The patient is accompanied by his wife on the iPad today.  Allergies: No Known Allergies  Current Medications: Current Outpatient Medications  Medication Sig Dispense Refill  . amLODipine (NORVASC) 2.5 MG tablet Take 2.5 mg by mouth daily.    [name] aspirin EC 81 MG tablet Take 81 mg by mouth daily.    . Cholecalciferol (VITAMIN D) 125 MCG (5000 UT) CAPS Take 5,000 mg by mouth.    . docusate sodium ([employer] STOOL SOFTENER) 100 MG capsule Take 100 mg by mouth 2 (two) times daily.    [name] HYDROcodone-acetaminophen (NORCO/VICODIN) 5-325 MG  tablet Take 1 tablet by mouth every 6 (six) hours as needed for moderate pain (up to 3 doses for moderate pain.).     [name] loratadine (CLARITIN) 10 MG tablet Take 10 mg by mouth daily.    [name] olmesartan (BENICAR) 20 MG tablet Take 20 mg by mouth daily.    . potassium chloride SA (KLOR-CON M20) 20 MEQ tablet Take by mouth.    . Probiotic Product (PROBIOTIC DAILY PO) Take 1 tablet by mouth daily.    . cephALEXin (KEFLEX) 500 MG capsule Take 500 mg by mouth 3 (three) times daily. (Patient not taking: Reported on 11/12/2019)    . ibuprofen (ADVIL) 800 MG tablet Take 800 mg by mouth every 8 (eight) hours as needed. (Patient not taking: Reported on 11/26/2019)    . lidocaine-prilocaine (EMLA) cream Apply to affected area once (Patient not taking: Reported on 01/15/2020) 30 g 3  . ondansetron (ZOFRAN) 8 MG tablet Take 1 tablet (8 mg total) by mouth 2 (two) times daily as needed for refractory nausea / vomiting. Start on day 3 after chemotherapy. (Patient not taking: Reported on 11/26/2019) 30 tablet 1   No current facility-administered medications for this visit.   Facility-Administered Medications Ordered in Other Visits  Medication Dose Route Frequency Provider Last Rate Last Admin  . 0.9 %  sodium chloride infusion   Intravenous Once Amun Stemm C, MD      . 0.9 %  sodium chloride infusion   Intravenous Continuous Lequita Asal, MD 10 mL/hr at 12/31/19 1000 New Bag at 12/31/19 1000  . sodium chloride flush (NS) 0.9 % injection 10 mL  10 mL Intracatheter PRN Lequita Asal, MD   10 mL at 10/03/19 1354    Review of Systems  Constitutional: Negative for chills, diaphoresis, fever, malaise/fatigue and weight loss (+ 4 lbs).       Feels good.  HENT: Negative for congestion, ear discharge, ear pain, hearing loss, nosebleeds, sinus pain, sore throat and tinnitus.   Eyes: Negative for blurred vision.  Respiratory: Negative for cough, hemoptysis, sputum production and shortness of breath.     Cardiovascular: Negative for chest pain, palpitations and leg swelling.  Gastrointestinal: Negative for abdominal pain, blood in stool, constipation, diarrhea, heartburn, melena, nausea and vomiting.  Genitourinary: Negative for dysuria, frequency, hematuria and urgency.  Musculoskeletal: Negative for back pain, joint pain, myalgias and neck pain.       Soreness on top of right foot.  Skin: Negative for itching and rash.  Neurological: Positive for sensory change (neuropathy in finger tips, wearing gloves). Negative for dizziness, tingling, weakness and headaches.  Endo/Heme/Allergies: Does not bruise/bleed easily.  Psychiatric/Behavioral: Negative for depression and memory loss. The patient is not nervous/anxious and does not have insomnia.   All other systems reviewed and are negative.  Performance status (ECOG): 1  Vitals Blood pressure 129/76, pulse 98, temperature 98.7 F (  37.1 C), temperature source Tympanic, weight 269 lb 11.7 oz (122.3 kg), SpO2 100 %.   Physical Exam Vitals and nursing note reviewed.  Constitutional:      General: He is not in acute distress.    Appearance: Normal appearance. He is well-developed. He is not diaphoretic.     Interventions: Face mask in place.     Comments: In wheelchair.  HENT:     Head:     Comments: Gray hair and beard. Eyes:     General: No scleral icterus.    Conjunctiva/sclera: Conjunctivae normal.  Musculoskeletal:     Comments: Patient declines evaluation of foot.  Neurological:     Mental Status: He is alert and oriented to person, place, and time. Mental status is at baseline.  Psychiatric:        Behavior: Behavior normal.        Thought Content: Thought content normal.        Judgment: Judgment normal.    Imaging studies: 12/20/2018:  Chest, abdomen and pelvis CTrevealed postoperative findings of transverse colon resection without evidence of recurrent mass, definite lymphadenopathy, or distant metastatic disease to  explain rising CEA. Therewereprominent sub-centimeter retroperitoneal and iliac lymph nodes, uncertain significance. There were postoperative findings about the left renal hilum, of uncertain nature, with a broad-based postoperative fat containing left-sided lumbar hernia.There was bibasilar bronchiectasis and scarring with multiple post infectious or inflammatory pneumatoceles, unchanged in comparison to CT date 06/23/2011. 04/02/2019:  PET scanrevealed a2.4 x 2.3 cm (SUV 11)hypermetabolic soft tissue density caudal and anterior to the pancreatic neckfavored to represent isolated peritoneal or nodal metastasis in the setting of prior transverse colonic resection (expected primary drainage). Although this was immediately adjacent to the pancreas, a fat plane was maintained, arguing strongly against a pancreatic primary. Otherwise,there wasno evidence of hypermetabolic metastasis. 04/17/2019:  Upper endoscopic ultrasoundon 04/17/2019 revealeda normal esophagus, stomach, duodenum, and pancreas.There was a 2.4 x 2.4 cm irregularmassin the retroperitoneum adjacent to, but not involving the pancreatic neck.  10/09/2019:  Abdomen and pelvis CT revealed a 2.4 x 2.3 cm lobulated nodule anterior to the pancreatic neck, previously FDG PET avid, not changed in size although decreased in internal attenuation suggesting treatment response. There was no evidence of new metastatic disease in the abdomen or pelvis.  There were unchanged prominent subcentimeter retroperitoneal and celiac axis lymph nodes, not previously FDG avid and nonspecific. There were postoperative findings about the left renal hilum with a broad-based left-sided lumbar hernia and hepatic steatosis. 12/05/2019:  Chest, abdomen and pelvic CT revealed mild decrease in size of previous FDG avid soft tissue attenuating lesion within the upper abdomen just inferior and anterior to the pancreatic neck (2.2 cm in maximum dimension vs 2.4 cm  previously).  There was no new or progressive disease. 01/14/2020:  PET scan revealed an interval decrease in size and FDG uptake (2.4 x 2.3 cm with SUV 11 to 2.4 x 1.7 cm with SUV 4.09) associated with the previously referenced soft tissue density caudal and anterior to the pancreatic neck suggesting treatment response. There were no new sites of FDG avid tumor.   Hospital Outpatient Visit on 01/14/2020  Component Date Value Ref Range Status  . Glucose-Capillary 01/14/2020 83  70 - 99 mg/dL Final   Glucose reference range applies only to samples taken after fasting for at least 8 hours.    Assessment:  Jared Gutierrez is a 79 y.o. male  withmetastatic colon cancer. He was diagnosed with stage I colon cancers/p  transverse colectomy on 10/17/2013. 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 Tumor extended into the submucosa. Margins were negative. There was lymphovascular invasion. 14 lymph nodes were negative.Pathologic stagewas T1 N0.  Colonoscopyon 10/02/2014 noted several 3 mm polyps and an 8 mm polyp in the cecum and transverse colon. Pathology revealed tubular adenomas negative for high-grade dysplasia and malignancy. Colonoscopyon 02/16/2017 revealed 2 diminutive polyps in the descending colon. Pathology revealed tubular adenomas without dysplasia or malignancy.  CEAhas been followed: 3.1 on 04/27/2014, 2.3 on 11/11/2014, 3.0 on 05/12/2015, 2.7 on 11/10/2015, 3.2 on 05/10/2016, 2.8 on 11/01/2016, 3.3 on 04/25/2017, 3.1 on 10/31/2017, 4.0 on 05/01/2018, 7.0on 11/28/2018, 7.4 on 12/13/2018, 10.5 on 03/25/2019, 17.8on 06/25/2019, 16.2 on 07/09/2019, 13.8 on 07/23/2019, 11.9 on 07/30/2019, 6.1 on 08/27/2019, 6.1 on 09/10/2019, 6.4 on 10/01/2019, 6.9 on 10/15/2019, 7.7 on 10/29/2019, 9.0 on 11/26/2019, 9.2 on 12/10/2019 and 10.8 on 12/31/2019.  PET scanon 04/02/2019 revealed a2.4 x  2.3 cm (SUV 11)hypermetabolic soft tissue density caudal and anterior to the pancreatic neckfavored to represent isolated peritoneal or nodal metastasis in the setting of prior transverse colonic resection (expected primary drainage). Although this was immediately adjacent to the pancreas, a fat plane was maintained, arguing strongly against a pancreatic primary. Otherwise,there wasno evidence of hypermetabolic metastasis.  Upper endoscopic ultrasoundon 04/17/2019 revealeda normal esophagus, stomach, duodenum, and pancreas.There was a 2.4 x 2.4 cm irregularmassin the retroperitoneum adjacent to, but not involving the pancreatic neck. FNA and core needle biopsy were performed. Pathologyrevealed adenocarcinoma compatible with a metastatic lesion of colorectal origin. Tumor cells were positive for CK20 and CDX2andnegative for CK7.  Omniseqon 05/15/2019 revealed + KRAS and TP53. Negative results included BRAF V600E, Her2, NRAS, NTRK, PD-L1 (<1%), and TMB 8.7/Mb (intermediate). MMR testingfrom his colon resection on 10/16/2013 was intact with a low probability of MSI-H.  He is s/p 11 cycles ofFOLFOXchemotherapy (07/09/2019- 08/27/2019; 09/17/2019 - 10/15/2019; 11/12/2019 - 12/17/2019) and 1 cycle of 5-FU and leucovorin (12/31/2019).He received Neulasta after cycle #4 and #5 secondary to progressive leukopenia.  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  He received a truncated course of FOLFOX with cycle #11 secondary to oxaliplatin reaction.  PET scan on 01/14/2020 revealed an interval decrease in size and FDG uptake (2.4 x 2.3 cm with SUV 11 to 2.4 x 1.7 cm with SUV 4.09) associated with the previously referenced soft tissue density caudal and anterior to the pancreatic neck suggesting treatment response. There were no new sites of FDG avid tumor.  He has chronic right lower extremity edema. Right lower extremity duplexon 12/24/2018 revealed no DVT. He has a small right  Baker's cyst.Bilateral lower extremityduplexon 08/16/2019 revealed no evidence of deep venous thrombosis in either lower extremity. Increased pulsatility of the venous waveforms bilaterally. These findings suggestedelevated right heart pressures. Differential considerations includedtricuspid regurgitation, right heart failure, pulmonary arterial hypertension and chronic COPD.  [facility] to [place] 08/16/2019 -08/17/2019 with right lower extremitycellulitis.He was unable to bear weight. Hewas treated withIV fluids, NSAIDs, colchicine,and broad antibiotics (vancomycin and Cefepime).He was discharged onindomethacin x 5 daysand Keflex 500 mg TID x 5 days.  He received the Moderna COVID-19 vaccine on 06/07/2019 and 07/05/2019.  Symptomatically, he feels good.  He notes soreness on the top of his right foot.  Neuropathy is stable.  Plan: 1.   Labs today: CBC with diff, CMP, Mg 2. Metastaticcolon cancer Clinically, he continues to do well.  Exam is unremarkable. He initially presented with stage I  disease s/p resection. PET scan on 04/02/2019 revealeda2.4 x 2.3 cm (SUV 11)soft tissue density caudal and anterior to the pancreatic neck. 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 Lesion was not resectable. NGS revealed no targetable mutation. MMR on original resection revealed a a low probability of MSI-H. He declined Avastin. He received 10 cycles of FOLFOX chemotherapy and 1 cycle of 5-FU and leucovorin secondary to an oxaliplatin reaction. PET scan on 01/14/2020 was personally reviewed.  Agree with radiology findings.  Images reviewed with patient.   He has an isolated 2.4 cm lesion caudal and anterior to the pancreatic neck.   Lesion has desponded to therapy with slight decrease in size and decrease in  SUV.   There are no other lesions.  Review tumor board discussions and plan for SBRT.    Potential side effects reviewed.  Discuss plan for surveillance imaging s/p SBRT. 3.   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  Neuropathy is not affecting function.  Continue to monitor. 4.   Radiation oncology consult. 5.   RTC every 6-12 weeks for port flush. 6.   RTC in 6 weeks for MD assessment and labs (CBC with diff, CMP).  I discussed the assessment and treatment plan with the patient.  The patient was provided an opportunity to ask questions and all were answered.  The patient agreed with the plan and demonstrated an understanding of the instructions.  The patient was advised to call back if the symptoms worsen or if the condition fails to improve as anticipated.  I provided 14 minutes of face-to-face time during this this encounter and > 50% was spent counseling as documented under my assessment and plan.  An additional 5+ minutes were spent reviewing his chart (Epic and Care Everywhere) including notes, labs, and imaging studies.    Lequita Asal, MD, PhD    01/15/2020, 5:00 PM  I, Mirian Mo Tufford, am acting as Education administrator for [employer]. Mike Gip, MD, PhD.  I, Melissa C. Mike Gip, MD, have reviewed the above documentation for accuracy and completeness, and I agree with the above.

## 2020-01-15 ENCOUNTER — Inpatient Hospital Stay: Payer: Medicare Other | Attending: Hematology and Oncology | Admitting: Hematology and Oncology

## 2020-01-15 ENCOUNTER — Encounter: Payer: Self-pay | Admitting: Hematology and Oncology

## 2020-01-15 VITALS — BP 129/76 | HR 98 | Temp 98.7°F | Wt 269.7 lb

## 2020-01-15 DIAGNOSIS — G620 Drug-induced polyneuropathy: Secondary | ICD-10-CM | POA: Diagnosis not present

## 2020-01-15 DIAGNOSIS — Z79899 Other long term (current) drug therapy: Secondary | ICD-10-CM | POA: Diagnosis not present

## 2020-01-15 DIAGNOSIS — E669 Obesity, unspecified: Secondary | ICD-10-CM | POA: Insufficient documentation

## 2020-01-15 DIAGNOSIS — Z791 Long term (current) use of non-steroidal anti-inflammatories (NSAID): Secondary | ICD-10-CM

## 2020-01-15 DIAGNOSIS — T451X5D Adverse effect of antineoplastic and immunosuppressive drugs, subsequent encounter: Secondary | ICD-10-CM

## 2020-01-15 DIAGNOSIS — T451X5A Adverse effect of antineoplastic and immunosuppressive drugs, initial encounter: Secondary | ICD-10-CM

## 2020-01-15 DIAGNOSIS — I10 Essential (primary) hypertension: Secondary | ICD-10-CM | POA: Diagnosis not present

## 2020-01-15 DIAGNOSIS — Z7982 Long term (current) use of aspirin: Secondary | ICD-10-CM | POA: Diagnosis not present

## 2020-01-15 DIAGNOSIS — Z7189 Other specified counseling: Secondary | ICD-10-CM

## 2020-01-15 DIAGNOSIS — C184 Malignant neoplasm of transverse colon: Secondary | ICD-10-CM | POA: Diagnosis not present

## 2020-01-15 DIAGNOSIS — C786 Secondary malignant neoplasm of retroperitoneum and peritoneum: Secondary | ICD-10-CM | POA: Insufficient documentation

## 2020-01-15 NOTE — Progress Notes (Signed)
The patient c/o tenderness noted to his right foot

## 2020-01-20 ENCOUNTER — Ambulatory Visit: Payer: Medicare Other | Admitting: Hematology and Oncology

## 2020-01-20 ENCOUNTER — Other Ambulatory Visit: Payer: Medicare Other

## 2020-01-29 ENCOUNTER — Encounter (INDEPENDENT_AMBULATORY_CARE_PROVIDER_SITE_OTHER): Payer: Self-pay

## 2020-01-29 ENCOUNTER — Ambulatory Visit
Admission: RE | Admit: 2020-01-29 | Discharge: 2020-01-29 | Disposition: A | Discharge status: Home / Self Care | Payer: Medicare Other | Source: Ambulatory Visit | Attending: Radiation Oncology | Admitting: Radiation Oncology

## 2020-01-29 ENCOUNTER — Encounter: Payer: Self-pay | Admitting: Radiation Oncology

## 2020-01-29 ENCOUNTER — Other Ambulatory Visit: Payer: Self-pay

## 2020-01-29 VITALS — BP 177/77 | HR 71 | Temp 97.0°F | Wt 275.0 lb

## 2020-01-29 DIAGNOSIS — M129 Arthropathy, unspecified: Secondary | ICD-10-CM | POA: Insufficient documentation

## 2020-01-29 DIAGNOSIS — Z79899 Other long term (current) drug therapy: Secondary | ICD-10-CM | POA: Diagnosis not present

## 2020-01-29 DIAGNOSIS — Z9221 Personal history of antineoplastic chemotherapy: Secondary | ICD-10-CM | POA: Diagnosis not present

## 2020-01-29 DIAGNOSIS — Z87442 Personal history of urinary calculi: Secondary | ICD-10-CM | POA: Diagnosis not present

## 2020-01-29 DIAGNOSIS — E669 Obesity, unspecified: Secondary | ICD-10-CM | POA: Diagnosis not present

## 2020-01-29 DIAGNOSIS — Z803 Family history of malignant neoplasm of breast: Secondary | ICD-10-CM | POA: Insufficient documentation

## 2020-01-29 DIAGNOSIS — I10 Essential (primary) hypertension: Secondary | ICD-10-CM | POA: Diagnosis not present

## 2020-01-29 DIAGNOSIS — Z86010 Personal history of colonic polyps: Secondary | ICD-10-CM | POA: Insufficient documentation

## 2020-01-29 DIAGNOSIS — Z7982 Long term (current) use of aspirin: Secondary | ICD-10-CM | POA: Diagnosis not present

## 2020-01-29 DIAGNOSIS — C184 Malignant neoplasm of transverse colon: Secondary | ICD-10-CM | POA: Insufficient documentation

## 2020-01-29 DIAGNOSIS — R918 Other nonspecific abnormal finding of lung field: Secondary | ICD-10-CM | POA: Insufficient documentation

## 2020-01-29 DIAGNOSIS — R609 Edema, unspecified: Secondary | ICD-10-CM | POA: Insufficient documentation

## 2020-01-29 DIAGNOSIS — C7889 Secondary malignant neoplasm of other digestive organs: Secondary | ICD-10-CM | POA: Diagnosis present

## 2020-01-29 NOTE — Consult Note (Signed)
NEW PATIENT EVALUATION  Name: Jared Gutierrez  MRN: [id number]  Date:   01/29/2020     DOB: [birth year]   This 79 y.o. 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  Lymphatic invasion was present no lymph nodes were involved.  Patient had a slowly rising CEA PET scan in November 2020 showed a 2.4 x 2.3 cm hypermetabolic soft tissue density in the caudal and anterior region of the pancreatic neck.  Mass was confirmed on upper endoscopy pathology showed adenocarcinoma compatible with metastatic lesion of colorectal origin tumor cells were positive for CK20.  Patient is status post 11 cycles of FOLFOX chemotherapy and 1 cycle of 5-FU leucovorin.  Most recent PET CT scan this month showed internal decrease in the size of the lesion to 2.4 cm on decreased hypermetabolic activity indicative of treatment response.  No other areas of hypermetabolic [place] were noted.  Patient does have bilateral lower extremity edema and has been admitted in the past with lower extremity cellulitis.  He is now referred to ration collagen for consideration of adjuvant radiation therapy to this region.  He is otherwise doing well specifically denies any problems with bowel movements or abdominal pain or  discomfort.  PLANNED TREATMENT REGIMEN: External beam radiation therapy to area of previous significant hypermetabolic activity in the region of the pancreas  PAST MEDICAL HISTORY:  has a past medical history of Arthritis, Cancer ([place]), Cavitary lesion of lung, Chicken pox, Colon cancer ([place]), History of kidney stones, Hypertension, Lipoma of colon, Nephrolithiasis, Nephrolithiasis, Obesity, Shingles, and Tubular adenoma of colon.    PAST SURGICAL HISTORY:  Past Surgical History:  Procedure Laterality Date  . COLON SURGERY    . COLONOSCOPY N/A 10/02/2014   Procedure: COLONOSCOPY;  Surgeon: Josefine Class, MD;  Location: [facility] ENDOSCOPY;  Service: Endoscopy;  Laterality: N/A;  . COLONOSCOPY WITH PROPOFOL N/A 02/16/2017   Procedure: COLONOSCOPY WITH PROPOFOL;  Surgeon: Manya Silvas, MD;  Location: [facility] ENDOSCOPY;  Service: Endoscopy;  Laterality: N/A;  . EUS N/A 04/17/2019   Procedure: FULL UPPER ENDOSCOPIC ULTRASOUND (EUS) RADIAL;  Surgeon: Holly Bodily, MD;  Location: [facility] ENDOSCOPY;  Service: Gastroenterology;  Laterality: N/A;  . KIDNEY STONE SURGERY    . PARTIAL COLECTOMY  10/17/2013  . PORTACATH PLACEMENT Right 06/13/2019   Procedure: INSERTION PORT-A-CATH;  Surgeon: Benjamine Sprague, DO;  Location: [facility] ORS;  Service: General;  Laterality: Right;    FAMILY HISTORY: family history includes Breast cancer in his mother; COPD in his father; Cancer in his mother.  SOCIAL HISTORY:  reports that he has never smoked. He has never used smokeless tobacco. He reports current alcohol use. He reports that he does not use drugs.  ALLERGIES: Patient has no known allergies.  MEDICATIONS:  Current Outpatient Medications  Medication Sig Dispense Refill  . amLODipine (NORVASC) 2.5 MG  tablet Take 2.5 mg by mouth daily.    [name] aspirin EC 81 MG tablet Take 81 mg by mouth daily.    . Cholecalciferol (VITAMIN D) 125 MCG (5000 UT) CAPS Take 5,000 mg by mouth.    . docusate sodium ([employer] STOOL  SOFTENER) 100 MG capsule Take 100 mg by mouth 2 (two) times daily.    [name] HYDROcodone-acetaminophen (NORCO/VICODIN) 5-325 MG tablet Take 1 tablet by mouth every 6 (six) hours as needed for moderate pain (up to 3 doses for moderate pain.).     [name] loratadine (CLARITIN) 10 MG tablet Take 10 mg by mouth daily.    [name] olmesartan (BENICAR) 20 MG tablet Take 20 mg by mouth daily.    . potassium chloride SA (KLOR-CON M20) 20 MEQ tablet Take by mouth.    . Probiotic Product (PROBIOTIC DAILY PO) Take 1 tablet by mouth daily.    . cephALEXin (KEFLEX) 500 MG capsule Take 500 mg by mouth 3 (three) times daily. (Patient not taking: Reported on 11/12/2019)    . ibuprofen (ADVIL) 800 MG tablet Take 800 mg by mouth every 8 (eight) hours as needed. (Patient not taking: Reported on 11/26/2019)    . lidocaine-prilocaine (EMLA) cream Apply to affected area once (Patient not taking: Reported on 01/15/2020) 30 g 3  . ondansetron (ZOFRAN) 8 MG tablet Take 1 tablet (8 mg total) by mouth 2 (two) times daily as needed for refractory nausea / vomiting. Start on day 3 after chemotherapy. (Patient not taking: Reported on 11/26/2019) 30 tablet 1   No current facility-administered medications for this encounter.   Facility-Administered Medications Ordered in Other Encounters  Medication Dose Route Frequency Provider Last Rate Last Admin  . 0.9 %  sodium chloride infusion   Intravenous Once Corcoran, Melissa C, MD      . 0.9 %  sodium chloride infusion   Intravenous Continuous Lequita Asal, MD 10 mL/hr at 12/31/19 1000 New Bag at 12/31/19 1000  . sodium chloride flush (NS) 0.9 % injection 10 mL  10 mL Intracatheter PRN Nolon Stalls C, MD   10 mL at 10/03/19 1354    ECOG PERFORMANCE STATUS:  0 - Asymptomatic  REVIEW OF SYSTEMS: Except for the cellulitis lower extremity edema and history of colon cancer Patient denies any weight loss, fatigue, weakness, fever, chills or night sweats. Patient denies any loss of vision, blurred  vision. Patient denies any ringing  of the ears or hearing loss. No irregular heartbeat. Patient denies heart murmur or history of fainting. Patient denies any chest pain or pain radiating to her upper extremities. Patient denies any shortness of breath, difficulty breathing at night, cough or hemoptysis. Patient denies any swelling in the lower legs. Patient denies any nausea vomiting, vomiting of blood, or coffee ground material in the vomitus. Patient denies any stomach pain. Patient states has had normal bowel movements no significant constipation or diarrhea. Patient denies any dysuria, hematuria or significant nocturia. Patient denies any problems walking, swelling in the joints or loss of balance. Patient denies any skin changes, loss of hair or loss of weight. Patient denies any excessive worrying or anxiety or significant depression. Patient denies any problems with insomnia. Patient denies excessive thirst, polyuria, polydipsia. Patient denies any swollen glands, patient denies easy bruising or easy bleeding. Patient denies any recent infections, allergies or URI. Patient "s visual fields have not changed significantly in recent time.   PHYSICAL EXAM: BP (!) 177/77 (BP Location: Left Arm, Patient Position: Sitting, Cuff Size:  Normal)   Pulse 71   Temp (!) 97 F (36.1 C) (Tympanic)   Wt 275 lb (124.7 kg)   BMI 41.81 kg/m  Well-developed obese male in NAD.  Well-developed well-nourished patient in NAD. HEENT reveals PERLA, EOMI, discs not visualized.  Oral cavity is clear. No oral mucosal lesions are identified. Neck is clear without evidence of cervical or supraclavicular adenopathy. Lungs are clear to A&P. Cardiac examination is essentially unremarkable with regular rate and rhythm without murmur rub or thrill. Abdomen is benign with no organomegaly or masses noted. Motor sensory and DTR levels are equal and symmetric in the upper and lower extremities. Cranial nerves II through XII are grossly  intact. Proprioception is intact. No peripheral adenopathy or edema is identified. No motor or sensory levels are noted. Crude visual fields are within normal range.  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  It is a hard area to do SBRT on since there is nothing in this region to target at this time.  I would favor a hybrid 3-dimensional treatment course of 3000 cGy in 10 fractions to this area.  There should be a low side effect profile although possibility of diarrhea abdominal complaints skin reaction and fatigue all were described in detail to the patient and his wife.  I have personally set up and ordered CT simulation for early next week.  I have personally set up and ordered a CT simulation.  I would like to take this opportunity to thank you for allowing me to participate in the care of your patient.Noreene Filbert, MD

## 2020-02-02 ENCOUNTER — Ambulatory Visit
Admission: RE | Admit: 2020-02-02 | Discharge: 2020-02-02 | Disposition: A | Discharge status: Home / Self Care | Payer: Medicare Other | Source: Ambulatory Visit | Attending: Radiation Oncology | Admitting: Radiation Oncology

## 2020-02-02 DIAGNOSIS — Z510 Encounter for antineoplastic radiation therapy: Secondary | ICD-10-CM | POA: Insufficient documentation

## 2020-02-02 DIAGNOSIS — C184 Malignant neoplasm of transverse colon: Secondary | ICD-10-CM | POA: Diagnosis present

## 2020-02-04 DIAGNOSIS — Z510 Encounter for antineoplastic radiation therapy: Secondary | ICD-10-CM | POA: Diagnosis not present

## 2020-02-06 ENCOUNTER — Other Ambulatory Visit: Payer: Self-pay | Admitting: *Deleted

## 2020-02-06 DIAGNOSIS — C184 Malignant neoplasm of transverse colon: Secondary | ICD-10-CM

## 2020-02-09 ENCOUNTER — Ambulatory Visit: Admission: RE | Admit: 2020-02-09 | Payer: Medicare Other | Source: Ambulatory Visit

## 2020-02-09 DIAGNOSIS — Z510 Encounter for antineoplastic radiation therapy: Secondary | ICD-10-CM | POA: Diagnosis not present

## 2020-02-10 ENCOUNTER — Ambulatory Visit
Admission: RE | Admit: 2020-02-10 | Discharge: 2020-02-10 | Disposition: A | Discharge status: Home / Self Care | Payer: Medicare Other | Source: Ambulatory Visit | Attending: Radiation Oncology | Admitting: Radiation Oncology

## 2020-02-10 DIAGNOSIS — Z510 Encounter for antineoplastic radiation therapy: Secondary | ICD-10-CM | POA: Diagnosis not present

## 2020-02-11 ENCOUNTER — Ambulatory Visit
Admission: RE | Admit: 2020-02-11 | Discharge: 2020-02-11 | Disposition: A | Discharge status: Home / Self Care | Payer: Medicare Other | Source: Ambulatory Visit | Attending: Radiation Oncology | Admitting: Radiation Oncology

## 2020-02-11 DIAGNOSIS — Z510 Encounter for antineoplastic radiation therapy: Secondary | ICD-10-CM | POA: Diagnosis not present

## 2020-02-12 ENCOUNTER — Ambulatory Visit
Admission: RE | Admit: 2020-02-12 | Discharge: 2020-02-12 | Disposition: A | Discharge status: Home / Self Care | Payer: Medicare Other | Source: Ambulatory Visit | Attending: Radiation Oncology | Admitting: Radiation Oncology

## 2020-02-12 DIAGNOSIS — Z510 Encounter for antineoplastic radiation therapy: Secondary | ICD-10-CM | POA: Diagnosis not present

## 2020-02-13 ENCOUNTER — Ambulatory Visit
Admission: RE | Admit: 2020-02-13 | Discharge: 2020-02-13 | Disposition: A | Discharge status: Home / Self Care | Payer: Medicare Other | Source: Ambulatory Visit | Attending: Radiation Oncology | Admitting: Radiation Oncology

## 2020-02-13 DIAGNOSIS — C184 Malignant neoplasm of transverse colon: Secondary | ICD-10-CM | POA: Insufficient documentation

## 2020-02-13 DIAGNOSIS — Z510 Encounter for antineoplastic radiation therapy: Secondary | ICD-10-CM | POA: Insufficient documentation

## 2020-02-16 ENCOUNTER — Ambulatory Visit
Admission: RE | Admit: 2020-02-16 | Discharge: 2020-02-16 | Disposition: A | Discharge status: Home / Self Care | Payer: Medicare Other | Source: Ambulatory Visit | Attending: Radiation Oncology | Admitting: Radiation Oncology

## 2020-02-16 DIAGNOSIS — Z510 Encounter for antineoplastic radiation therapy: Secondary | ICD-10-CM | POA: Diagnosis not present

## 2020-02-17 ENCOUNTER — Ambulatory Visit
Admission: RE | Admit: 2020-02-17 | Discharge: 2020-02-17 | Disposition: A | Discharge status: Home / Self Care | Payer: Medicare Other | Source: Ambulatory Visit | Attending: Radiation Oncology | Admitting: Radiation Oncology

## 2020-02-17 ENCOUNTER — Inpatient Hospital Stay: Payer: Medicare Other | Attending: Hematology and Oncology

## 2020-02-17 DIAGNOSIS — Z510 Encounter for antineoplastic radiation therapy: Secondary | ICD-10-CM | POA: Diagnosis not present

## 2020-02-18 ENCOUNTER — Ambulatory Visit
Admission: RE | Admit: 2020-02-18 | Discharge: 2020-02-18 | Disposition: A | Discharge status: Home / Self Care | Payer: Medicare Other | Source: Ambulatory Visit | Attending: Radiation Oncology | Admitting: Radiation Oncology

## 2020-02-18 DIAGNOSIS — Z510 Encounter for antineoplastic radiation therapy: Secondary | ICD-10-CM | POA: Diagnosis not present

## 2020-02-19 ENCOUNTER — Ambulatory Visit
Admission: RE | Admit: 2020-02-19 | Discharge: 2020-02-19 | Disposition: A | Discharge status: Home / Self Care | Payer: Medicare Other | Source: Ambulatory Visit | Attending: Radiation Oncology | Admitting: Radiation Oncology

## 2020-02-19 DIAGNOSIS — Z510 Encounter for antineoplastic radiation therapy: Secondary | ICD-10-CM | POA: Diagnosis not present

## 2020-02-20 ENCOUNTER — Ambulatory Visit
Admission: RE | Admit: 2020-02-20 | Discharge: 2020-02-20 | Disposition: A | Discharge status: Home / Self Care | Payer: Medicare Other | Source: Ambulatory Visit | Attending: Radiation Oncology | Admitting: Radiation Oncology

## 2020-02-20 DIAGNOSIS — Z510 Encounter for antineoplastic radiation therapy: Secondary | ICD-10-CM | POA: Diagnosis not present

## 2020-02-23 ENCOUNTER — Ambulatory Visit
Admission: RE | Admit: 2020-02-23 | Discharge: 2020-02-23 | Disposition: A | Discharge status: Home / Self Care | Payer: Medicare Other | Source: Ambulatory Visit | Attending: Radiation Oncology | Admitting: Radiation Oncology

## 2020-02-23 DIAGNOSIS — Z510 Encounter for antineoplastic radiation therapy: Secondary | ICD-10-CM | POA: Diagnosis not present

## 2020-02-25 NOTE — Progress Notes (Signed)
[facility]  [address], [address] [place], [place] [address] Phone: [phone]  Fax: [phone]   Clinic Day: 02/26/2020   Referring physician: Tracie Harrier, MD  Chief Complaint: Jared Gutierrez is a 79 y.o. male with metastaticcolon cancerwho is seen for 6 week assessment after completion of radiation.  HPI: The patient was last seen in the medical oncology clinic on 01/15/2020. At that time, he felt good.  He noted soreness on the top of his right foot.  Neuropathy was stable. PET scan on 01/14/2020 revealed an isolated 2.4 cm lesion caudal and anterior to the pancreatic neck (decreased in size and SUV). We discussed radiation oncology consult.  The patient was seen in consultation by Dr Baruch Gouty on 01/29/2020.  Recommendation was a hybrid 3-dimensional course of 3000 cGy in 10 fractions.  He received radiation from 02/10/2020 - 02/23/2020.   During the interim, he has been okay. The neuropathy in his fingers and whole foot has been bothering him. It does not keep him up at night. He feels like he is walking on pins and needles. He does not feel that he needs Gabapentin at this time.  He reports urinary frequency during the day. He wakes up once at night to use the bathroom. He is being more active around the house. He takes out the trash and does the dishes.  The patient states that radiation went well. He had one episode of diarrhea during treatment.    Past Medical History:  Diagnosis Date  . Arthritis    OSTEOARTHRITIS  . Cancer ([place])   . Cavitary lesion of lung    RIGHT LOWER LOBE  . Chicken pox   . Colon cancer ([place])   . History of kidney stones   . Hypertension   . Lipoma of colon   . Nephrolithiasis   . Nephrolithiasis   . Obesity   . Shingles   . Tubular adenoma of colon    multiple fragments    Past Surgical History:  Procedure Laterality Date  . COLON SURGERY    . COLONOSCOPY N/A 10/02/2014   Procedure: COLONOSCOPY;   Surgeon: Josefine Class, MD;  Location: [facility] ENDOSCOPY;  Service: Endoscopy;  Laterality: N/A;  . COLONOSCOPY WITH PROPOFOL N/A 02/16/2017   Procedure: COLONOSCOPY WITH PROPOFOL;  Surgeon: Manya Silvas, MD;  Location: [facility] ENDOSCOPY;  Service: Endoscopy;  Laterality: N/A;  . EUS N/A 04/17/2019   Procedure: FULL UPPER ENDOSCOPIC ULTRASOUND (EUS) RADIAL;  Surgeon: Holly Bodily, MD;  Location: [facility] ENDOSCOPY;  Service: Gastroenterology;  Laterality: N/A;  . KIDNEY STONE SURGERY    . PARTIAL COLECTOMY  10/17/2013  . PORTACATH PLACEMENT Right 06/13/2019   Procedure: INSERTION PORT-A-CATH;  Surgeon: Benjamine Sprague, DO;  Location: [facility] ORS;  Service: General;  Laterality: Right;    Family History  Problem Relation Age of Onset  . Cancer Mother   . Breast cancer Mother   . COPD Father     Social History:  reports that he has never smoked. He has never used smokeless tobacco. He reports current alcohol use. He reports that he does not use drugs. 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He is married and his wife's name is [name] [phone]).The patient is accompanied by his wife today.  Allergies: No Known Allergies  Current Medications: Current Outpatient Medications  Medication Sig Dispense Refill  . acetaminophen (TYLENOL) 500 MG tablet Take 500 mg by mouth every 6 (six) hours  as needed.    [name] amLODipine (NORVASC) 2.5 MG tablet Take 2.5 mg by mouth daily.    [name] aspirin EC 81 MG tablet Take 81 mg by mouth daily.    . cephALEXin (KEFLEX) 500 MG capsule Take 500 mg by mouth 3 (three) times daily.     . Cholecalciferol (VITAMIN D) 125 MCG (5000 UT) CAPS Take 5,000 mg by mouth.    . docusate sodium ([employer] STOOL SOFTENER) 100 MG capsule Take 100 mg by mouth 2 (two) times daily.    [name] HYDROcodone-acetaminophen (NORCO/VICODIN) 5-325 MG tablet Take 1 tablet by mouth every 6 (six) hours as needed for moderate pain (up to 3 doses for moderate pain.).     [name]  lidocaine-prilocaine (EMLA) cream Apply to affected area once 30 g 3  . loratadine (CLARITIN) 10 MG tablet Take 10 mg by mouth daily.    [name] olmesartan (BENICAR) 20 MG tablet Take 20 mg by mouth daily.    . ondansetron (ZOFRAN) 8 MG tablet Take 1 tablet (8 mg total) by mouth 2 (two) times daily as needed for refractory nausea / vomiting. Start on day 3 after chemotherapy. 30 tablet 1  . potassium chloride SA (KLOR-CON M20) 20 MEQ tablet Take by mouth.    . Probiotic Product (PROBIOTIC DAILY PO) Take 1 tablet by mouth daily.    [name] ibuprofen (ADVIL) 800 MG tablet Take 800 mg by mouth every 8 (eight) hours as needed. (Patient not taking: Reported on 11/26/2019)     Current Facility-Administered Medications  Medication Dose Route Frequency Provider Last Rate Last Admin  . heparin lock flush 100 unit/mL  500 Units Intravenous Once Lequita Asal, MD       Facility-Administered Medications Ordered in Other Visits  Medication Dose Route Frequency Provider Last [place]  . 0.9 %  sodium chloride infusion   Intravenous Once Mashayla Lavin C, MD      . 0.9 %  sodium chloride infusion   Intravenous Continuous Lequita Asal, MD 10 mL/hr at 12/31/19 1000 New Bag at 12/31/19 1000  . sodium chloride flush (NS) 0.9 % injection 10 mL  10 mL Intracatheter PRN Lequita Asal, MD   10 mL at 10/03/19 1354   Review of Systems  Constitutional: Negative for chills, diaphoresis, fever, malaise/fatigue and weight loss (up 1 lb).       Feels good.  HENT: Negative for congestion, ear discharge, ear pain, hearing loss, nosebleeds, sinus pain, sore throat and tinnitus.   Eyes: Negative for blurred vision.  Respiratory: Negative for cough, hemoptysis, sputum production and shortness of breath.   Cardiovascular: Negative for chest pain, palpitations and leg swelling.  Gastrointestinal: Positive for diarrhea (1 episode in radiation). Negative for abdominal pain, blood in stool, constipation, heartburn,  melena, nausea and vomiting.  Genitourinary: Positive for frequency. Negative for dysuria, hematuria and urgency.  Musculoskeletal: Negative for back pain, joint pain, myalgias and neck pain.  Skin: Negative for itching and rash.  Neurological: Positive for sensory change (neuropathy in fingers and feet). Negative for dizziness, tingling, weakness and headaches.  Endo/Heme/Allergies: Does not bruise/bleed easily.  Psychiatric/Behavioral: Negative for depression and memory loss. The patient is not nervous/anxious and does not have insomnia.   All other systems reviewed and are negative.  Performance status (ECOG): 1  Vitals Blood pressure (!) 141/63, pulse 62, temperature (!) 97 F (36.1 C), temperature source Tympanic, resp. rate 18, weight 270 lb 11.6 oz (122.8 kg), SpO2 99 %.   Physical  Exam Vitals and nursing note reviewed.  Constitutional:      General: He is not in acute distress.    Appearance: Normal appearance. He is well-developed. He is not diaphoretic.     Interventions: Face mask in place.  HENT:     Head: Normocephalic and atraumatic.     Comments: Gray hair and beard.    Mouth/Throat:     Mouth: Mucous membranes are moist.     Pharynx: Oropharynx is clear.  Eyes:     General: No scleral icterus.    Extraocular Movements: Extraocular movements intact.     Conjunctiva/sclera: Conjunctivae normal.     Pupils: Pupils are equal, round, and reactive to light.  Cardiovascular:     Rate and Rhythm: Normal rate and regular rhythm.     Heart sounds: Normal heart sounds. No murmur heard.   Pulmonary:     Effort: Pulmonary effort is normal. No respiratory distress.     Breath sounds: Normal breath sounds. No wheezing or rales.  Chest:     Chest wall: No tenderness.  Abdominal:     General: Bowel sounds are normal. There is no distension.     Palpations: Abdomen is soft. There is no mass.     Tenderness: There is no abdominal tenderness. There is no guarding or rebound.   Musculoskeletal:        General: No swelling or tenderness. Normal range of motion.     Cervical back: Normal range of motion and neck supple.     Right lower leg: Edema (chronic) present.     Left lower leg: Edema (chronic) present.  Lymphadenopathy:     Head:     Right side of head: No preauricular, posterior auricular or occipital adenopathy.     Left side of head: No preauricular, posterior auricular or occipital adenopathy.     Cervical: No cervical adenopathy.     Upper Body:     Right upper body: No supraclavicular or axillary adenopathy.     Left upper body: No supraclavicular or axillary adenopathy.     Lower Body: No right inguinal adenopathy. No left inguinal adenopathy.  Skin:    General: Skin is warm and dry.  Neurological:     Mental Status: He is alert and oriented to person, place, and time.  Psychiatric:        Behavior: Behavior normal.        Thought Content: Thought content normal.        Judgment: Judgment normal.    Imaging studies: 12/20/2018:  Chest, abdomen and pelvis CTrevealed postoperative findings of transverse colon resection without evidence of recurrent mass, definite lymphadenopathy, or distant metastatic disease to explain rising CEA. Therewereprominent sub-centimeter retroperitoneal and iliac lymph nodes, uncertain significance. There were postoperative findings about the left renal hilum, of uncertain nature, with a broad-based postoperative fat containing left-sided lumbar hernia.There was bibasilar bronchiectasis and scarring with multiple post infectious or inflammatory pneumatoceles, unchanged in comparison to CT date 06/23/2011. 04/02/2019:  PET scanrevealed a2.4 x 2.3 cm (SUV 11)hypermetabolic soft tissue density caudal and anterior to the pancreatic neckfavored to represent isolated peritoneal or nodal metastasis in the setting of prior transverse colonic resection (expected primary drainage). Although this was immediately adjacent to  the pancreas, a fat plane was maintained, arguing strongly against a pancreatic primary. Otherwise,there wasno evidence of hypermetabolic metastasis. 04/17/2019:  Upper endoscopic ultrasoundon 04/17/2019 revealeda normal esophagus, stomach, duodenum, and pancreas.There was a 2.4 x 2.4 cm irregularmassin the retroperitoneum adjacent  to, but not involving the pancreatic neck.  10/09/2019:  Abdomen and pelvis CT revealed a 2.4 x 2.3 cm lobulated nodule anterior to the pancreatic neck, previously FDG PET avid, not changed in size although decreased in internal attenuation suggesting treatment response. There was no evidence of new metastatic disease in the abdomen or pelvis.  There were unchanged prominent subcentimeter retroperitoneal and celiac axis lymph nodes, not previously FDG avid and nonspecific. There were postoperative findings about the left renal hilum with a broad-based left-sided lumbar hernia and hepatic steatosis. 12/05/2019:  Chest, abdomen and pelvic CT revealed mild decrease in size of previous FDG avid soft tissue attenuating lesion within the upper abdomen just inferior and anterior to the pancreatic neck (2.2 cm in maximum dimension vs 2.4 cm previously).  There was no new or progressive disease. 01/14/2020:  PET scan revealed an interval decrease in size and FDG uptake (2.4 x 2.3 cm with SUV 11 to 2.4 x 1.7 cm with SUV 4.09) associated with the previously referenced soft tissue density caudal and anterior to the pancreatic neck suggesting treatment response. There were no new sites of FDG avid tumor.   Infusion on 02/26/2020  Component Date Value Ref Range Status  . Sodium 02/26/2020 137  135 - 145 mmol/L Final  . Potassium 02/26/2020 3.5  3.5 - 5.1 mmol/L Final  . Chloride 02/26/2020 104  98 - 111 mmol/L Final  . CO2 02/26/2020 23  22 - 32 mmol/L Final  . Glucose, Bld 02/26/2020 108* 70 - 99 mg/dL Final   Glucose reference range applies only to samples taken after fasting  for at least 8 hours.  . BUN 02/26/2020 10  8 - 23 mg/dL Final  . Creatinine, Ser 02/26/2020 0.72  0.61 - 1.24 mg/dL Final  . Calcium 02/26/2020 9.0  8.9 - 10.3 mg/dL Final  . Total Protein 02/26/2020 6.8  6.5 - 8.1 g/dL Final  . Albumin 02/26/2020 3.4* 3.5 - 5.0 g/dL Final  . AST 02/26/2020 39  15 - 41 U/L Final  . ALT 02/26/2020 36  0 - 44 U/L Final  . Alkaline Phosphatase 02/26/2020 55  38 - 126 U/L Final  . Total Bilirubin 02/26/2020 0.7  0.3 - 1.2 mg/dL Final  . GFR, Estimated 02/26/2020 >60  >60 mL/min Final  . Anion gap 02/26/2020 10  5 - 15 Final   Performed at [facility], [address] [place], [place] [address]  . WBC 02/26/2020 4.8  4.0 - 10.5 K/uL Final  . RBC 02/26/2020 4.08* 4.22 - 5.81 MIL/uL Final  . Hemoglobin 02/26/2020 12.6* 13.0 - 17.0 g/dL Final  . HCT 02/26/2020 37.7* 39 - 52 % Final  . MCV 02/26/2020 92.4  80.0 - 100.0 fL Final  . MCH 02/26/2020 30.9  26.0 - 34.0 pg Final  . MCHC 02/26/2020 33.4  30.0 - 36.0 g/dL Final  . RDW 02/26/2020 13.2  11.5 - 15.5 % Final  . Platelets 02/26/2020 172  150 - 400 K/uL Final  . nRBC 02/26/2020 0.0  0.0 - 0.2 % Final  . Neutrophils Relative % 02/26/2020 70  % Final  . Neutro Abs 02/26/2020 3.4  1.7 - 7.7 K/uL Final  . Lymphocytes Relative 02/26/2020 16  % Final  . Lymphs Abs 02/26/2020 0.8  0.7 - 4.0 K/uL Final  . Monocytes Relative 02/26/2020 10  % Final  . Monocytes Absolute 02/26/2020 0.5  0.1 - 1.0 K/uL Final  . Eosinophils Relative 02/26/2020 4  % Final  .  Eosinophils Absolute 02/26/2020 0.2  0.0 - 0.5 K/uL Final  . Basophils Relative 02/26/2020 0  % Final  . Basophils Absolute 02/26/2020 0.0  0.0 - 0.1 K/uL Final  . Immature Granulocytes 02/26/2020 0  % Final  . Abs Immature Granulocytes 02/26/2020 0.02  0.00 - 0.07 K/uL Final   Performed at [facility], [address], [place], [place] [address]    Assessment:  Jared Gutierrez is a 79 y.o. male  withmetastatic colon cancer. He was  diagnosed with stage I colon cancers/p transverse colectomy on 10/17/2013. 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 Tumor extended into the submucosa. Margins were negative. There was lymphovascular invasion. 14 lymph nodes were negative.Pathologic stagewas T1 N0.  Colonoscopyon 10/02/2014 noted several 3 mm polyps and an 8 mm polyp in the cecum and transverse colon. Pathology revealed tubular adenomas negative for high-grade dysplasia and malignancy. Colonoscopyon 02/16/2017 revealed 2 diminutive polyps in the descending colon. Pathology revealed tubular adenomas without dysplasia or malignancy.  CEAhas been followed: 3.1 on 04/27/2014, 2.3 on 11/11/2014, 3.0 on 05/12/2015, 2.7 on 11/10/2015, 3.2 on 05/10/2016, 2.8 on 11/01/2016, 3.3 on 04/25/2017, 3.1 on 10/31/2017, 4.0 on 05/01/2018, 7.0on 11/28/2018, 7.4 on 12/13/2018, 10.5 on 03/25/2019, 17.8on 06/25/2019, 16.2 on 07/09/2019, 13.8 on 07/23/2019, 11.9 on 07/30/2019, 6.1 on 08/27/2019, 6.1 on 09/10/2019, 6.4 on 10/01/2019, 6.9 on 10/15/2019, 7.7 on 10/29/2019, 9.0 on 11/26/2019, 9.2 on 12/10/2019 and 10.8 on 12/31/2019.  PET scanon 04/02/2019 revealed a2.4 x 2.3 cm (SUV 11)hypermetabolic soft tissue density caudal and anterior to the pancreatic neckfavored to represent isolated peritoneal or nodal metastasis in the setting of prior transverse colonic resection (expected primary drainage). Although this was immediately adjacent to the pancreas, a fat plane was maintained, arguing strongly against a pancreatic primary. Otherwise,there wasno evidence of hypermetabolic metastasis.  Upper endoscopic ultrasoundon 04/17/2019 revealeda normal esophagus, stomach, duodenum, and pancreas.There was a 2.4 x 2.4 cm irregularmassin the retroperitoneum adjacent to, but not involving the pancreatic neck. FNA and core needle biopsy were  performed. Pathologyrevealed adenocarcinoma compatible with a metastatic lesion of colorectal origin. Tumor cells were positive for CK20 and CDX2andnegative for CK7.  Omniseqon 05/15/2019 revealed + KRAS and TP53. Negative results included BRAF V600E, Her2, NRAS, NTRK, PD-L1 (<1%), and TMB 8.7/Mb (intermediate). MMR testingfrom his colon resection on 10/16/2013 was intact with a low probability of MSI-H.  He received 11 cycles ofFOLFOXchemotherapy (07/09/2019- 08/27/2019; 09/17/2019 - 10/15/2019; 11/12/2019 - 12/17/2019) and 1 cycle of 5-FU and leucovorin (12/31/2019).He received Neulasta after cycle #4 and #5 secondary to progressive leukopenia.  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  He received a truncated course of FOLFOX with cycle #11 secondary to oxaliplatin reaction.  PET scan on 01/14/2020 revealed an interval decrease in size and FDG uptake (2.4 x 2.3 cm with SUV 11 to 2.4 x 1.7 cm with SUV 4.09) associated with the previously referenced soft tissue density caudal and anterior to the pancreatic neck suggesting treatment response. There were no new sites of FDG avid tumor.  He received a hybrid 3-dimensional course of 3000 cGy in 10 fractions to the residual FDG avid mass from 02/10/2020 - 02/23/2020.   He has chronic right lower extremity edema. Right lower extremity duplexon 12/24/2018 revealed no DVT. He has a small right Baker's cyst.Bilateral lower extremityduplexon 08/16/2019 revealed no evidence of deep venous thrombosis in either lower extremity. Increased pulsatility of the venous waveforms bilaterally. These findings suggestedelevated right heart  pressures. Differential considerations includedtricuspid regurgitation, right heart failure, pulmonary arterial hypertension and chronic COPD.  Hewas admitted to [facility] 08/16/2019 -08/17/2019 with right lower extremitycellulitis.He was unable to bear weight. Hewas treated withIV fluids, NSAIDs,  colchicine,and broad antibiotics (vancomycin and Cefepime).He was discharged onindomethacin x 5 daysand Keflex 500 mg TID x 5 days.  He received the Moderna COVID-19 vaccine on 06/07/2019 and 07/05/2019. He received the flu shot on 02/03/2020.  Symptomatically, he is doing well.  He notes neuropathy in his fingers and feett.  Plan: 1.   Labs today: CBC with diff, CMP. 2. Metastaticcolon cancer Clinically, he is doing well  Exam is stable. He initially presented with stage I disease s/p resection. PET scan on 04/02/2019 revealeda2.4 x 2.3 cm (SUV 11)soft tissue density caudal and anterior to the pancreatic neck. 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 Lesion was not resectable. NGS revealed no targetable mutation. MMR on original resection revealed a a low probability of MSI-H. He declined Avastin. He received 11 cycles of FOLFOX chemotherapy and 1 cycle of 5-FU and leucovorin secondary to an oxaliplatin reaction. PET scan on 01/14/2020 revealed an isolated 2.4 cm lesion caudal and anterior to the pancreatic neck.  He underwent SBRT (completed 02/23/2020).  Discuss plan for surveillance s/p completion of definitive radiation. 3.   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  Neuropathy is not affecting function.  He declines gabapentin.  Continue to monitor. 4.   Port flush every 6-12 weeks. 5.   Abdomen and pelvis CT on 05/28/2020. 6.   Labs prior to scan (CBC with diff, CMP, CEA). 7.   RTC after CT for MD assessment, review of labs and imaging studies.  I discussed the assessment and treatment plan with the patient.  The patient was provided an opportunity to ask questions and all were answered.  The patient agreed with the plan and demonstrated an  understanding of the instructions.  The patient was advised to call back if the symptoms worsen or if the condition fails to improve as anticipated.  I provided 11 minutes of face-to-face time during this this encounter and > 50% was spent counseling as documented under my assessment and plan.  An additional 5 minutes were spent reviewing his chart (Epic and Care Everywhere) including notes, labs, and imaging studies.    Lequita Asal, MD, PhD    02/26/2020, 10:32 AM   I, Mirian Mo Tufford, am acting as Education administrator for [employer]. Mike Gip, MD, PhD.  I, Morton Simson C. Mike Gip, MD, have reviewed the above documentation for accuracy and completeness, and I agree with the above.

## 2020-02-26 ENCOUNTER — Other Ambulatory Visit: Payer: Self-pay

## 2020-02-26 ENCOUNTER — Encounter: Payer: Self-pay | Admitting: Hematology and Oncology

## 2020-02-26 ENCOUNTER — Inpatient Hospital Stay: Payer: Medicare Other | Attending: Hematology and Oncology

## 2020-02-26 ENCOUNTER — Inpatient Hospital Stay (HOSPITAL_BASED_OUTPATIENT_CLINIC_OR_DEPARTMENT_OTHER): Payer: Medicare Other | Admitting: Hematology and Oncology

## 2020-02-26 VITALS — BP 141/63 | HR 62 | Temp 97.0°F | Resp 18 | Wt 270.7 lb

## 2020-02-26 DIAGNOSIS — C786 Secondary malignant neoplasm of retroperitoneum and peritoneum: Secondary | ICD-10-CM

## 2020-02-26 DIAGNOSIS — T451X5A Adverse effect of antineoplastic and immunosuppressive drugs, initial encounter: Secondary | ICD-10-CM

## 2020-02-26 DIAGNOSIS — T451X5D Adverse effect of antineoplastic and immunosuppressive drugs, subsequent encounter: Secondary | ICD-10-CM | POA: Diagnosis not present

## 2020-02-26 DIAGNOSIS — Z79899 Other long term (current) drug therapy: Secondary | ICD-10-CM | POA: Insufficient documentation

## 2020-02-26 DIAGNOSIS — C184 Malignant neoplasm of transverse colon: Secondary | ICD-10-CM

## 2020-02-26 DIAGNOSIS — G620 Drug-induced polyneuropathy: Secondary | ICD-10-CM | POA: Diagnosis not present

## 2020-02-26 DIAGNOSIS — I10 Essential (primary) hypertension: Secondary | ICD-10-CM | POA: Insufficient documentation

## 2020-02-26 DIAGNOSIS — R350 Frequency of micturition: Secondary | ICD-10-CM | POA: Insufficient documentation

## 2020-02-26 DIAGNOSIS — Z7189 Other specified counseling: Secondary | ICD-10-CM

## 2020-02-26 DIAGNOSIS — Z5111 Encounter for antineoplastic chemotherapy: Secondary | ICD-10-CM

## 2020-02-26 DIAGNOSIS — E669 Obesity, unspecified: Secondary | ICD-10-CM | POA: Diagnosis not present

## 2020-02-26 DIAGNOSIS — Z7982 Long term (current) use of aspirin: Secondary | ICD-10-CM | POA: Insufficient documentation

## 2020-02-26 DIAGNOSIS — Z791 Long term (current) use of non-steroidal anti-inflammatories (NSAID): Secondary | ICD-10-CM | POA: Insufficient documentation

## 2020-02-26 LAB — CBC WITH DIFFERENTIAL/PLATELET
Abs Immature Granulocytes: 0.02 10*3/uL (ref 0.00–0.07)
Basophils Absolute: 0 10*3/uL (ref 0.0–0.1)
Basophils Relative: 0 %
Eosinophils Absolute: 0.2 10*3/uL (ref 0.0–0.5)
Eosinophils Relative: 4 %
HCT: 37.7 % — ABNORMAL LOW (ref 39.0–52.0)
Hemoglobin: 12.6 g/dL — ABNORMAL LOW (ref 13.0–17.0)
Immature Granulocytes: 0 %
Lymphocytes Relative: 16 %
Lymphs Abs: 0.8 10*3/uL (ref 0.7–4.0)
MCH: 30.9 pg (ref 26.0–34.0)
MCHC: 33.4 g/dL (ref 30.0–36.0)
MCV: 92.4 fL (ref 80.0–100.0)
Monocytes Absolute: 0.5 10*3/uL (ref 0.1–1.0)
Monocytes Relative: 10 %
Neutro Abs: 3.4 10*3/uL (ref 1.7–7.7)
Neutrophils Relative %: 70 %
Platelets: 172 10*3/uL (ref 150–400)
RBC: 4.08 MIL/uL — ABNORMAL LOW (ref 4.22–5.81)
RDW: 13.2 % (ref 11.5–15.5)
WBC: 4.8 10*3/uL (ref 4.0–10.5)
nRBC: 0 % (ref 0.0–0.2)

## 2020-02-26 LAB — COMPREHENSIVE METABOLIC PANEL
ALT: 36 U/L (ref 0–44)
AST: 39 U/L (ref 15–41)
Albumin: 3.4 g/dL — ABNORMAL LOW (ref 3.5–5.0)
Alkaline Phosphatase: 55 U/L (ref 38–126)
Anion gap: 10 (ref 5–15)
BUN: 10 mg/dL (ref 8–23)
CO2: 23 mmol/L (ref 22–32)
Calcium: 9 mg/dL (ref 8.9–10.3)
Chloride: 104 mmol/L (ref 98–111)
Creatinine, Ser: 0.72 mg/dL (ref 0.61–1.24)
GFR, Estimated: >60 mL/min (ref >60)
Glucose, Bld: 108 mg/dL — ABNORMAL HIGH (ref 70–99)
Potassium: 3.5 mmol/L (ref 3.5–5.1)
Sodium: 137 mmol/L (ref 135–145)
Total Bilirubin: 0.7 mg/dL (ref 0.3–1.2)
Total Protein: 6.8 g/dL (ref 6.5–8.1)

## 2020-02-26 MED ORDER — HEPARIN SOD (PORK) LOCK FLUSH 100 UNIT/ML IV SOLN
500.0000 [IU] | Freq: Once | INTRAVENOUS | Status: AC
  Administered 2020-02-26: 500 [IU] via INTRAVENOUS
  Filled 2020-02-26: qty 5

## 2020-02-26 NOTE — Progress Notes (Signed)
Patient here for oncology follow-up appointment, expresses complaints of chronic leg numbness and diarrhea.

## 2020-02-26 NOTE — Patient Instructions (Signed)

## 2020-03-25 ENCOUNTER — Other Ambulatory Visit: Payer: Self-pay

## 2020-03-25 ENCOUNTER — Ambulatory Visit
Admission: RE | Admit: 2020-03-25 | Discharge: 2020-03-25 | Disposition: A | Discharge status: Home / Self Care | Payer: Medicare Other | Source: Ambulatory Visit | Attending: Radiation Oncology | Admitting: Radiation Oncology

## 2020-03-25 ENCOUNTER — Encounter: Payer: Self-pay | Admitting: Radiation Oncology

## 2020-03-25 VITALS — BP 135/74 | HR 81 | Temp 97.7°F | Wt 274.0 lb

## 2020-03-25 DIAGNOSIS — C7889 Secondary malignant neoplasm of other digestive organs: Secondary | ICD-10-CM | POA: Diagnosis not present

## 2020-03-25 DIAGNOSIS — Z923 Personal history of irradiation: Secondary | ICD-10-CM | POA: Diagnosis not present

## 2020-03-25 DIAGNOSIS — G629 Polyneuropathy, unspecified: Secondary | ICD-10-CM | POA: Insufficient documentation

## 2020-03-25 DIAGNOSIS — C184 Malignant neoplasm of transverse colon: Secondary | ICD-10-CM | POA: Insufficient documentation

## 2020-03-25 NOTE — Progress Notes (Signed)
Radiation Oncology Follow up Note  Name: Jared Gutierrez   Date:   03/25/2020 MRN:  [id number] DOB: [birth year]    This 79 y.o. male presents to the clinic today for 1 month follow-up status post radiation therapy for solitary colonic metastasis in the region of the anterior and caudal region of the pancreatic neck.  REFERRING PROVIDER: Tracie Harrier, MD  HPI: Patient is a 79 year old male now at 1 month having completed radiation therapy to a solitary hypermetabolic metastatic lesion in the region of the anterior and caudal region of the pancreatic neck and patient status post transverse colectomy for adenocarcinoma. He is seen today in routine follow-up is doing fairly well specifically denies any abdominal pain diarrhea nausea. He is having problems with neuropathy although that is being addressed by medical oncology.. Patient is scheduled for CT scan in January.  COMPLICATIONS OF TREATMENT: none  FOLLOW UP COMPLIANCE: keeps appointments   PHYSICAL EXAM:  BP 135/74   Pulse 81   Temp 97.7 F (36.5 C) (Tympanic)   Wt 274 lb (124.3 kg)   BMI 41.66 kg/m  Well-developed wheelchair-bound slightly obese male in NAD. Well-developed well-nourished patient in NAD. HEENT reveals PERLA, EOMI, discs not visualized.  Oral cavity is clear. No oral mucosal lesions are identified. Neck is clear without evidence of cervical or supraclavicular adenopathy. Lungs are clear to A&P. Cardiac examination is essentially unremarkable with regular rate and rhythm without murmur rub or thrill. Abdomen is benign with no organomegaly or masses noted. Motor sensory and DTR levels are equal and symmetric in the upper and lower extremities. Cranial nerves II through XII are grossly intact. Proprioception is intact. No peripheral adenopathy or edema is identified. No motor or sensory levels are noted. Crude visual fields are within normal range.  RADIOLOGY RESULTS: No current films to review  PLAN: Present time  patient is doing well no significant side effects from his recent radiation therapy. He will have a CT scan in January of asked to see him back shortly after that scan. He continues close follow-up care with medical oncology. Patient knows to call with any concerns.  I would like to take this opportunity to thank you for allowing me to participate in the care of your patient.Noreene Filbert, MD

## 2020-04-15 ENCOUNTER — Inpatient Hospital Stay: Payer: Medicare Other | Attending: Hematology and Oncology

## 2020-04-15 ENCOUNTER — Other Ambulatory Visit: Payer: Self-pay

## 2020-04-15 DIAGNOSIS — Z791 Long term (current) use of non-steroidal anti-inflammatories (NSAID): Secondary | ICD-10-CM | POA: Insufficient documentation

## 2020-04-15 DIAGNOSIS — Z79899 Other long term (current) drug therapy: Secondary | ICD-10-CM | POA: Diagnosis not present

## 2020-04-15 DIAGNOSIS — G620 Drug-induced polyneuropathy: Secondary | ICD-10-CM | POA: Diagnosis not present

## 2020-04-15 DIAGNOSIS — I10 Essential (primary) hypertension: Secondary | ICD-10-CM | POA: Diagnosis not present

## 2020-04-15 DIAGNOSIS — E669 Obesity, unspecified: Secondary | ICD-10-CM | POA: Diagnosis not present

## 2020-04-15 DIAGNOSIS — R350 Frequency of micturition: Secondary | ICD-10-CM | POA: Insufficient documentation

## 2020-04-15 DIAGNOSIS — T451X5D Adverse effect of antineoplastic and immunosuppressive drugs, subsequent encounter: Secondary | ICD-10-CM | POA: Diagnosis not present

## 2020-04-15 DIAGNOSIS — C184 Malignant neoplasm of transverse colon: Secondary | ICD-10-CM | POA: Insufficient documentation

## 2020-04-15 DIAGNOSIS — C786 Secondary malignant neoplasm of retroperitoneum and peritoneum: Secondary | ICD-10-CM | POA: Diagnosis present

## 2020-04-15 DIAGNOSIS — Z452 Encounter for adjustment and management of vascular access device: Secondary | ICD-10-CM | POA: Insufficient documentation

## 2020-04-15 DIAGNOSIS — Z95828 Presence of other vascular implants and grafts: Secondary | ICD-10-CM

## 2020-04-15 DIAGNOSIS — Z7982 Long term (current) use of aspirin: Secondary | ICD-10-CM | POA: Diagnosis not present

## 2020-04-15 MED ORDER — HEPARIN SOD (PORK) LOCK FLUSH 100 UNIT/ML IV SOLN
500.0000 [IU] | Freq: Once | INTRAVENOUS | Status: AC
  Administered 2020-04-15: 500 [IU] via INTRAVENOUS
  Filled 2020-04-15: qty 5

## 2020-04-15 MED ORDER — SODIUM CHLORIDE 0.9% FLUSH
10.0000 mL | INTRAVENOUS | Status: DC | PRN
  Administered 2020-04-15: 10 mL via INTRAVENOUS
  Filled 2020-04-15: qty 10

## 2020-05-14 ENCOUNTER — Ambulatory Visit
Admission: EM | Admit: 2020-05-14 | Discharge: 2020-05-14 | Disposition: A | Discharge status: Home / Self Care | Payer: Medicare Other | Attending: Family Medicine | Admitting: Family Medicine

## 2020-05-14 ENCOUNTER — Other Ambulatory Visit: Payer: Self-pay

## 2020-05-14 DIAGNOSIS — Z20822 Contact with and (suspected) exposure to covid-19: Secondary | ICD-10-CM | POA: Diagnosis present

## 2020-05-14 NOTE — Discharge Instructions (Addendum)

## 2020-05-14 NOTE — ED Triage Notes (Signed)
Patient states that he was exposed to covid but currently not having any symptoms.

## 2020-05-16 LAB — SARS CORONAVIRUS 2 (TAT 6-24 HRS): SARS Coronavirus 2: NEGATIVE

## 2020-05-27 ENCOUNTER — Ambulatory Visit
Admission: RE | Admit: 2020-05-27 | Discharge: 2020-05-27 | Disposition: A | Discharge status: Home / Self Care | Payer: Medicare Other | Source: Ambulatory Visit | Attending: Hematology and Oncology | Admitting: Hematology and Oncology

## 2020-05-27 ENCOUNTER — Other Ambulatory Visit: Payer: Self-pay

## 2020-05-27 ENCOUNTER — Inpatient Hospital Stay: Payer: Medicare Other | Attending: Hematology and Oncology

## 2020-05-27 DIAGNOSIS — Z791 Long term (current) use of non-steroidal anti-inflammatories (NSAID): Secondary | ICD-10-CM | POA: Insufficient documentation

## 2020-05-27 DIAGNOSIS — T451X5D Adverse effect of antineoplastic and immunosuppressive drugs, subsequent encounter: Secondary | ICD-10-CM | POA: Insufficient documentation

## 2020-05-27 DIAGNOSIS — Z95828 Presence of other vascular implants and grafts: Secondary | ICD-10-CM

## 2020-05-27 DIAGNOSIS — E669 Obesity, unspecified: Secondary | ICD-10-CM | POA: Insufficient documentation

## 2020-05-27 DIAGNOSIS — C184 Malignant neoplasm of transverse colon: Secondary | ICD-10-CM | POA: Diagnosis present

## 2020-05-27 DIAGNOSIS — C786 Secondary malignant neoplasm of retroperitoneum and peritoneum: Secondary | ICD-10-CM | POA: Insufficient documentation

## 2020-05-27 DIAGNOSIS — R350 Frequency of micturition: Secondary | ICD-10-CM | POA: Insufficient documentation

## 2020-05-27 DIAGNOSIS — I10 Essential (primary) hypertension: Secondary | ICD-10-CM | POA: Insufficient documentation

## 2020-05-27 DIAGNOSIS — Z7982 Long term (current) use of aspirin: Secondary | ICD-10-CM | POA: Insufficient documentation

## 2020-05-27 DIAGNOSIS — G620 Drug-induced polyneuropathy: Secondary | ICD-10-CM | POA: Insufficient documentation

## 2020-05-27 DIAGNOSIS — Z79899 Other long term (current) drug therapy: Secondary | ICD-10-CM | POA: Insufficient documentation

## 2020-05-27 LAB — CBC WITH DIFFERENTIAL/PLATELET
Abs Immature Granulocytes: 0.02 10*3/uL (ref 0.00–0.07)
Basophils Absolute: 0 10*3/uL (ref 0.0–0.1)
Basophils Relative: 1 %
Eosinophils Absolute: 0.2 10*3/uL (ref 0.0–0.5)
Eosinophils Relative: 3 %
HCT: 40 % (ref 39.0–52.0)
Hemoglobin: 13.2 g/dL (ref 13.0–17.0)
Immature Granulocytes: 0 %
Lymphocytes Relative: 22 %
Lymphs Abs: 1.2 10*3/uL (ref 0.7–4.0)
MCH: 30.7 pg (ref 26.0–34.0)
MCHC: 33 g/dL (ref 30.0–36.0)
MCV: 93 fL (ref 80.0–100.0)
Monocytes Absolute: 0.5 10*3/uL (ref 0.1–1.0)
Monocytes Relative: 8 %
Neutro Abs: 3.6 10*3/uL (ref 1.7–7.7)
Neutrophils Relative %: 66 %
Platelets: 196 10*3/uL (ref 150–400)
RBC: 4.3 MIL/uL (ref 4.22–5.81)
RDW: 12.4 % (ref 11.5–15.5)
WBC: 5.5 10*3/uL (ref 4.0–10.5)
nRBC: 0 % (ref 0.0–0.2)

## 2020-05-27 LAB — COMPREHENSIVE METABOLIC PANEL
ALT: 36 U/L (ref 0–44)
AST: 42 U/L — ABNORMAL HIGH (ref 15–41)
Albumin: 3.7 g/dL (ref 3.5–5.0)
Alkaline Phosphatase: 59 U/L (ref 38–126)
Anion gap: 12 (ref 5–15)
BUN: 12 mg/dL (ref 8–23)
CO2: 25 mmol/L (ref 22–32)
Calcium: 9.2 mg/dL (ref 8.9–10.3)
Chloride: 101 mmol/L (ref 98–111)
Creatinine, Ser: 0.79 mg/dL (ref 0.61–1.24)
GFR, Estimated: >60 mL/min (ref >60)
Glucose, Bld: 104 mg/dL — ABNORMAL HIGH (ref 70–99)
Potassium: 3.9 mmol/L (ref 3.5–5.1)
Sodium: 138 mmol/L (ref 135–145)
Total Bilirubin: 0.7 mg/dL (ref 0.3–1.2)
Total Protein: 7.2 g/dL (ref 6.5–8.1)

## 2020-05-27 MED ORDER — HEPARIN SOD (PORK) LOCK FLUSH 100 UNIT/ML IV SOLN
500.0000 [IU] | Freq: Once | INTRAVENOUS | Status: DC
  Filled 2020-05-27: qty 5

## 2020-05-27 MED ORDER — IOHEXOL 300 MG/ML  SOLN
100.0000 mL | Freq: Once | INTRAMUSCULAR | Status: AC | PRN
  Administered 2020-05-27: 100 mL via INTRAVENOUS

## 2020-05-27 MED ORDER — SODIUM CHLORIDE 0.9% FLUSH
10.0000 mL | INTRAVENOUS | Status: DC | PRN
  Administered 2020-05-27: 10 mL via INTRAVENOUS
  Filled 2020-05-27: qty 10

## 2020-05-27 NOTE — Progress Notes (Incomplete)
[facility]  [address], [address] [place], [place] [address] Phone: [phone]  Fax: [phone]   Clinic Day: 05/27/2020   Referring physician: Tracie Harrier, MD  Chief Complaint: Jared Gutierrez is a 80 y.o. male with metastaticcolon cancerwho is seen for 3 month assessment and review of interval CT scan.  HPI: The patient was last seen in the medical oncology clinic on 02/26/2020. At that time, he was doing well.  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 Hematocrit was 37.7, hemoglobin 12.6, MCV 92.4, platelets 172,000, WBC 4,800. Albumin was 3.4.  He completed SBRT on 02/23/2020.  The patient saw Dr. Baruch Gouty on 03/25/2020 for a follow-up 1 month after radiation. He had no significant side effects. Follow-up is on 06/01/2020.  Abdomen and pelvis CT with contrast on 05/27/2020 revealed  The index soft tissue lesion just inferior and anterior to the pancreatic neck was stable to minimally increased (2.9 x 1.8 cm compared to 2.4 x 1.7 cm) since 01/14/2020.  There were no new interval findings.  There was mild circumferential wall thickening although nondistention of the bladder likely accentuates wall thickness.  There was hepatic steatosis.  There were bilateral renal cysts and aortic atherosclerosis.  During the interim, ***    Past Medical History:  Diagnosis Date  . Arthritis    OSTEOARTHRITIS  . Cancer ([place])   . Cavitary lesion of lung    RIGHT LOWER LOBE  . Chicken pox   . Colon cancer ([place])   . History of kidney stones   . Hypertension   . Lipoma of colon   . Nephrolithiasis   . Nephrolithiasis   . Obesity   . Shingles   . Tubular adenoma of colon    multiple fragments    Past Surgical History:  Procedure Laterality Date  . COLON SURGERY    . COLONOSCOPY N/A 10/02/2014   Procedure: COLONOSCOPY;  Surgeon: Josefine Class, MD;  Location: [facility] ENDOSCOPY;  Service: Endoscopy;  Laterality: N/A;  . COLONOSCOPY WITH PROPOFOL  N/A 02/16/2017   Procedure: COLONOSCOPY WITH PROPOFOL;  Surgeon: Manya Silvas, MD;  Location: [facility] ENDOSCOPY;  Service: Endoscopy;  Laterality: N/A;  . EUS N/A 04/17/2019   Procedure: FULL UPPER ENDOSCOPIC ULTRASOUND (EUS) RADIAL;  Surgeon: Holly Bodily, MD;  Location: [facility] ENDOSCOPY;  Service: Gastroenterology;  Laterality: N/A;  . KIDNEY STONE SURGERY    . PARTIAL COLECTOMY  10/17/2013  . PORTACATH PLACEMENT Right 06/13/2019   Procedure: INSERTION PORT-A-CATH;  Surgeon: Benjamine Sprague, DO;  Location: [facility] ORS;  Service: General;  Laterality: Right;    Family History  Problem Relation Age of Onset  . Cancer Mother   . Breast cancer Mother   . COPD Father     Social History:  reports that he has never smoked. He has never used smokeless tobacco. He reports current alcohol use. He reports that he does not use drugs. 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He is married and his wife's name is [name] [phone]).The patient is accompanied by his wife ***today.  Allergies: No Known Allergies  Current Medications: Current Outpatient Medications  Medication Sig Dispense Refill  . acetaminophen (TYLENOL) 500 MG tablet Take 500 mg by mouth every 6 (six) hours as needed.    [name] amLODipine (NORVASC) 2.5 MG tablet Take 2.5 mg by mouth daily.    [name] aspirin EC 81 MG tablet Take 81 mg by mouth daily.    [name]  cephALEXin (KEFLEX) 500 MG capsule Take 500 mg by mouth 3 (three) times daily.     . Cholecalciferol (VITAMIN D) 125 MCG (5000 UT) CAPS Take 5,000 mg by mouth.    . docusate sodium ([employer] STOOL SOFTENER) 100 MG capsule Take 100 mg by mouth 2 (two) times daily.    [name] HYDROcodone-acetaminophen (NORCO/VICODIN) 5-325 MG tablet Take 1 tablet by mouth every 6 (six) hours as needed for moderate pain (up to 3 doses for moderate pain.).     [name] ibuprofen (ADVIL) 800 MG tablet Take 800 mg by mouth every 8 (eight) hours as needed. (Patient not taking: Reported on 11/26/2019)     . lidocaine-prilocaine (EMLA) cream Apply to affected area once 30 g 3  . loratadine (CLARITIN) 10 MG tablet Take 10 mg by mouth daily.    [name] olmesartan (BENICAR) 20 MG tablet Take 20 mg by mouth daily.    . ondansetron (ZOFRAN) 8 MG tablet Take 1 tablet (8 mg total) by mouth 2 (two) times daily as needed for refractory nausea / vomiting. Start on day 3 after chemotherapy. 30 tablet 1  . potassium chloride SA (KLOR-CON M20) 20 MEQ tablet Take by mouth.    . Probiotic Product (PROBIOTIC DAILY PO) Take 1 tablet by mouth daily.     No current facility-administered medications for this visit.   Facility-Administered Medications Ordered in Other Visits  Medication Dose Route Frequency Provider Last Rate Last Admin  . 0.9 %  sodium chloride infusion   Intravenous Once Corcoran, Melissa C, MD      . 0.9 %  sodium chloride infusion   Intravenous Continuous Lequita Asal, MD 10 mL/hr at 12/31/19 1000 New Bag at 12/31/19 1000  . sodium chloride flush (NS) 0.9 % injection 10 mL  10 mL Intracatheter PRN Lequita Asal, MD   10 mL at 10/03/19 1354   Review of Systems  Constitutional: Negative for chills, diaphoresis, fever, malaise/fatigue and weight loss (up 1 lb).       Feels good.  HENT: Negative for congestion, ear discharge, ear pain, hearing loss, nosebleeds, sinus pain, sore throat and tinnitus.   Eyes: Negative for blurred vision.  Respiratory: Negative for cough, hemoptysis, sputum production and shortness of breath.   Cardiovascular: Negative for chest pain, palpitations and leg swelling.  Gastrointestinal: Positive for diarrhea (1 episode in radiation). Negative for abdominal pain, blood in stool, constipation, heartburn, melena, nausea and vomiting.  Genitourinary: Positive for frequency. Negative for dysuria, hematuria and urgency.  Musculoskeletal: Negative for back pain, joint pain, myalgias and neck pain.  Skin: Negative for itching and rash.  Neurological: Positive for  sensory change (neuropathy in fingers and feet). Negative for dizziness, tingling, weakness and headaches.  Endo/Heme/Allergies: Does not bruise/bleed easily.  Psychiatric/Behavioral: Negative for depression and memory loss. The patient is not nervous/anxious and does not have insomnia.   All other systems reviewed and are negative.  Performance status (ECOG): 1***  Vitals There were no vitals taken for this visit.   Physical Exam Vitals and nursing note reviewed.  Constitutional:      General: He is not in acute distress.    Appearance: Normal appearance. He is well-developed. He is not diaphoretic.     Interventions: Face mask in place.  HENT:     Head: Normocephalic and atraumatic.     Comments: Gray hair and beard.    Mouth/Throat:     Mouth: Mucous membranes are moist.     Pharynx: Oropharynx is clear.  Eyes:     General: No scleral icterus.    Extraocular Movements: Extraocular movements intact.     Conjunctiva/sclera: Conjunctivae normal.     Pupils: Pupils are equal, round, and reactive to light.  Cardiovascular:     Rate and Rhythm: Normal rate and regular rhythm.     Heart sounds: Normal heart sounds. No murmur heard.   Pulmonary:     Effort: Pulmonary effort is normal. No respiratory distress.     Breath sounds: Normal breath sounds. No wheezing or rales.  Chest:     Chest wall: No tenderness.  Breasts:     Right: No axillary adenopathy or supraclavicular adenopathy.     Left: No axillary adenopathy or supraclavicular adenopathy.    Abdominal:     General: Bowel sounds are normal. There is no distension.     Palpations: Abdomen is soft. There is no mass.     Tenderness: There is no abdominal tenderness. There is no guarding or rebound.  Musculoskeletal:        General: No swelling or tenderness. Normal range of motion.     Cervical back: Normal range of motion and neck supple.     Right lower leg: Edema (chronic) present.     Left lower leg: Edema  (chronic) present.  Lymphadenopathy:     Head:     Right side of head: No preauricular, posterior auricular or occipital adenopathy.     Left side of head: No preauricular, posterior auricular or occipital adenopathy.     Cervical: No cervical adenopathy.     Upper Body:     Right upper body: No supraclavicular or axillary adenopathy.     Left upper body: No supraclavicular or axillary adenopathy.     Lower Body: No right inguinal adenopathy. No left inguinal adenopathy.  Skin:    General: Skin is warm and dry.  Neurological:     Mental Status: He is alert and oriented to person, place, and time.  Psychiatric:        Behavior: Behavior normal.        Thought Content: Thought content normal.        Judgment: Judgment normal.    Imaging studies: 12/20/2018:  Chest, abdomen and pelvis CTrevealed postoperative findings of transverse colon resection without evidence of recurrent mass, definite lymphadenopathy, or distant metastatic disease to explain rising CEA. Therewereprominent sub-centimeter retroperitoneal and iliac lymph nodes, uncertain significance. There were postoperative findings about the left renal hilum, of uncertain nature, with a broad-based postoperative fat containing left-sided lumbar hernia.There was bibasilar bronchiectasis and scarring with multiple post infectious or inflammatory pneumatoceles, unchanged in comparison to CT date 06/23/2011. 04/02/2019:  PET scanrevealed a2.4 x 2.3 cm (SUV 11)hypermetabolic soft tissue density caudal and anterior to the pancreatic neckfavored to represent isolated peritoneal or nodal metastasis in the setting of prior transverse colonic resection (expected primary drainage). Although this was immediately adjacent to the pancreas, a fat plane was maintained, arguing strongly against a pancreatic primary. Otherwise,there wasno evidence of hypermetabolic metastasis. 04/17/2019:  Upper endoscopic ultrasoundon 04/17/2019 revealeda  normal esophagus, stomach, duodenum, and pancreas.There was a 2.4 x 2.4 cm irregularmassin the retroperitoneum adjacent to, but not involving the pancreatic neck.  10/09/2019:  Abdomen and pelvis CT revealed a 2.4 x 2.3 cm lobulated nodule anterior to the pancreatic neck, previously FDG PET avid, not changed in size although decreased in internal attenuation suggesting treatment response. There was no evidence of new metastatic disease in the abdomen or pelvis.  There were unchanged prominent subcentimeter retroperitoneal and celiac axis lymph nodes, not previously FDG avid and nonspecific. There were postoperative findings about the left renal hilum with a broad-based left-sided lumbar hernia and hepatic steatosis. 12/05/2019:  Chest, abdomen and pelvic CT revealed mild decrease in size of previous FDG avid soft tissue attenuating lesion within the upper abdomen just inferior and anterior to the pancreatic neck (2.2 cm in maximum dimension vs 2.4 cm previously).  There was no new or progressive disease. 01/14/2020:  PET scan revealed an interval decrease in size and FDG uptake (2.4 x 2.3 cm with SUV 11 to 2.4 x 1.7 cm with SUV 4.09) associated with the previously referenced soft tissue density caudal and anterior to the pancreatic neck suggesting treatment response. There were no new sites of FDG avid tumor. 05/27/2020:  Abdomen and pelvis CT with contrast revealed the index soft tissue lesion just inferior and anterior to the pancreatic neck was stable to minimally increased (2.9 x 1.8 cm compared to 2.4 x 1.7 cm) since 01/14/2020.  There were no new interval findings.  There was mild circumferential wall thickening although nondistention of the bladder likely accentuates wall thickness.  There was hepatic steatosis.  There were bilateral renal cysts and aortic atherosclerosis.   No visits with results within 3 Day(s) from this visit.  Latest known visit with results is:  Infusion on 05/27/2020   Component Date Value Ref Range Status  . Sodium 05/27/2020 138  135 - 145 mmol/L Final  . Potassium 05/27/2020 3.9  3.5 - 5.1 mmol/L Final  . Chloride 05/27/2020 101  98 - 111 mmol/L Final  . CO2 05/27/2020 25  22 - 32 mmol/L Final  . Glucose, Bld 05/27/2020 104* 70 - 99 mg/dL Final   Glucose reference range applies only to samples taken after fasting for at least 8 hours.  . BUN 05/27/2020 12  8 - 23 mg/dL Final  . Creatinine, Ser 05/27/2020 0.79  0.61 - 1.24 mg/dL Final  . Calcium 05/27/2020 9.2  8.9 - 10.3 mg/dL Final  . Total Protein 05/27/2020 7.2  6.5 - 8.1 g/dL Final  . Albumin 05/27/2020 3.7  3.5 - 5.0 g/dL Final  . AST 05/27/2020 42* 15 - 41 U/L Final  . ALT 05/27/2020 36  0 - 44 U/L Final  . Alkaline Phosphatase 05/27/2020 59  38 - 126 U/L Final  . Total Bilirubin 05/27/2020 0.7  0.3 - 1.2 mg/dL Final  . GFR, Estimated 05/27/2020 >60  >60 mL/min Final   Comment: (NOTE) Calculated using the CKD-EPI Creatinine Equation (2021)   . Anion gap 05/27/2020 12  5 - 15 Final   Performed at [facility], [address] [place], [place] [address]  . WBC 05/27/2020 5.5  4.0 - 10.5 K/uL Final  . RBC 05/27/2020 4.30  4.22 - 5.81 MIL/uL Final  . Hemoglobin 05/27/2020 13.2  13.0 - 17.0 g/dL Final  . HCT 05/27/2020 40.0  39.0 - 52.0 % Final  . MCV 05/27/2020 93.0  80.0 - 100.0 fL Final  . MCH 05/27/2020 30.7  26.0 - 34.0 pg Final  . MCHC 05/27/2020 33.0  30.0 - 36.0 g/dL Final  . RDW 05/27/2020 12.4  11.5 - 15.5 % Final  . Platelets 05/27/2020 196  150 - 400 K/uL Final  . nRBC 05/27/2020 0.0  0.0 - 0.2 % Final  . Neutrophils Relative % 05/27/2020 66  % Final  . Neutro Abs 05/27/2020 3.6  1.7 - 7.7 K/uL Final  .  Lymphocytes Relative 05/27/2020 22  % Final  . Lymphs Abs 05/27/2020 1.2  0.7 - 4.0 K/uL Final  . Monocytes Relative 05/27/2020 8  % Final  . Monocytes Absolute 05/27/2020 0.5  0.1 - 1.0 K/uL Final  . Eosinophils Relative 05/27/2020 3  % Final  . Eosinophils  Absolute 05/27/2020 0.2  0.0 - 0.5 K/uL Final  . Basophils Relative 05/27/2020 1  % Final  . Basophils Absolute 05/27/2020 0.0  0.0 - 0.1 K/uL Final  . Immature Granulocytes 05/27/2020 0  % Final  . Abs Immature Granulocytes 05/27/2020 0.02  0.00 - 0.07 K/uL Final   Performed at [facility], [address] [place],  [address]    Assessment:  Jared Gutierrez is a 80 y.o. male  withmetastatic colon cancer. He was diagnosed with stage I colon cancers/p transverse colectomy on 10/17/2013. 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 Tumor extended into the submucosa. Margins were negative. There was lymphovascular invasion. 14 lymph nodes were negative.Pathologic stagewas T1 N0.  Colonoscopyon 10/02/2014 noted several 3 mm polyps and an 8 mm polyp in the cecum and transverse colon. Pathology revealed tubular adenomas negative for high-grade dysplasia and malignancy. Colonoscopyon 02/16/2017 revealed 2 diminutive polyps in the descending colon. Pathology revealed tubular adenomas without dysplasia or malignancy.  CEAhas been followed: 3.1 on 04/27/2014, 2.3 on 11/11/2014, 3.0 on 05/12/2015, 2.7 on 11/10/2015, 3.2 on 05/10/2016, 2.8 on 11/01/2016, 3.3 on 04/25/2017, 3.1 on 10/31/2017, 4.0 on 05/01/2018, 7.0on 11/28/2018, 7.4 on 12/13/2018, 10.5 on 03/25/2019, 17.8on 06/25/2019, 16.2 on 07/09/2019, 13.8 on 07/23/2019, 11.9 on 07/30/2019, 6.1 on 08/27/2019, 6.1 on 09/10/2019, 6.4 on 10/01/2019, 6.9 on 10/15/2019, 7.7 on 10/29/2019, 9.0 on 11/26/2019, 9.2 on 12/10/2019, 10.8 on 12/31/2019, and 11.6 on 05/27/2020.  PET scanon 04/02/2019 revealed a2.4 x 2.3 cm (SUV 11)hypermetabolic soft tissue density caudal and anterior to the pancreatic neckfavored to represent isolated peritoneal or nodal metastasis in the setting of prior transverse colonic resection (expected primary  drainage). Although this was immediately adjacent to the pancreas, a fat plane was maintained, arguing strongly against a pancreatic primary. Otherwise,there wasno evidence of hypermetabolic metastasis.  Upper endoscopic ultrasoundon 04/17/2019 revealeda normal esophagus, stomach, duodenum, and pancreas.There was a 2.4 x 2.4 cm irregularmassin the retroperitoneum adjacent to, but not involving the pancreatic neck. FNA and core needle biopsy were performed. Pathologyrevealed adenocarcinoma compatible with a metastatic lesion of colorectal origin. Tumor cells were positive for CK20 and CDX2andnegative for CK7.  Omniseqon 05/15/2019 revealed + KRAS and TP53. Negative results included BRAF V600E, Her2, NRAS, NTRK, PD-L1 (<1%), and TMB 8.7/Mb (intermediate). MMR testingfrom his colon resection on 10/16/2013 was intact with a low probability of MSI-H.  He received 11 cycles ofFOLFOXchemotherapy (07/09/2019- 08/27/2019; 09/17/2019 - 10/15/2019; 11/12/2019 - 12/17/2019) and 1 cycle of 5-FU and leucovorin (12/31/2019).He received Neulasta after cycle #4 and #5 secondary to progressive leukopenia.  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  He received a truncated course of FOLFOX with cycle #11 secondary to oxaliplatin reaction.  PET scan on 01/14/2020 revealed an interval decrease in size and FDG uptake (2.4 x 2.3 cm with SUV 11 to 2.4 x 1.7 cm with SUV 4.09) associated with the previously referenced soft tissue density caudal and anterior to the pancreatic neck suggesting treatment response. There were no new sites of FDG avid tumor.  He received a hybrid 3-dimensional course of 3000 cGy in 10 fractions to the residual FDG avid mass  from 02/10/2020 - 02/23/2020.   Abdomen and pelvis CT with contrast on 05/27/2020 revealed the index soft tissue lesion just inferior and anterior to the pancreatic neck was stable to minimally increased (2.9 x 1.8 cm compared to 2.4 x 1.7 cm) since  01/14/2020.  There were no new interval findings.    He has chronic right lower extremity edema. Right lower extremity duplexon 12/24/2018 revealed no DVT. He has a small right Baker's cyst.Bilateral lower extremityduplexon 08/16/2019 revealed no evidence of deep venous thrombosis in either lower extremity. Increased pulsatility of the venous waveforms bilaterally. These findings suggestedelevated right heart pressures. Differential considerations includedtricuspid regurgitation, right heart failure, pulmonary arterial hypertension and chronic COPD.  [facility] [facility] to [place] 08/16/2019 -08/17/2019 with right lower extremitycellulitis.He was unable to bear weight. Hewas treated withIV fluids, NSAIDs, colchicine,and broad antibiotics (vancomycin and Cefepime).He was discharged onindomethacin x 5 daysand Keflex 500 mg TID x 5 days.  He received the Moderna COVID-19 vaccine on 06/07/2019 and 07/05/2019. He received the flu shot on 02/03/2020.  Symptomatically, ***  Plan: 1.   Review labs from 05/27/2020   2. Metastaticcolon cancer Clinically, he is doing well  Exam is stable. He initially presented with stage I disease s/p resection. PET scan on 04/02/2019 revealeda2.4 x 2.3 cm (SUV 11)soft tissue density caudal and anterior to the pancreatic neck. 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 Lesion was not resectable. NGS revealed no targetable mutation. MMR on original resection revealed a a low probability of MSI-H. He declined Avastin. He received 11 cycles of FOLFOX chemotherapy and 1 cycle of 5-FU and leucovorin secondary to an oxaliplatin reaction. PET scan on 01/14/2020 revealed an isolated 2.4 cm lesion caudal and anterior to the pancreatic neck.  He underwent SBRT (completed  02/23/2020).  Review interval abdomen and pelvis CT on 05/27/2020.  Images personally reviewed.  Agree with radiology interpretation.   The lesion just inferior and anterior to the pancreatic neck was stable to minimally increased since 01/14/2020.   3.   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  Neuropathy is not affecting function.  He declines gabapentin.  Continue to monitor. 4.   Port flush every 6-12 weeks. 5.   Abdomen and pelvis CT on 05/28/2020. 6.   Labs prior to scan (CBC with diff, CMP, CEA). 7.   RTC after CT for MD assessment, review of labs and imaging studies.  I discussed the assessment and treatment plan with the patient.  The patient was provided an opportunity to ask questions and all were answered.  The patient agreed with the plan and demonstrated an understanding of the instructions.  The patient was advised to call back if the symptoms worsen or if the condition fails to improve as anticipated.  I provided *** minutes of face-to-face time during this this encounter and > 50% was spent counseling as documented under my assessment and plan.  Lequita Asal, MD, PhD    05/27/2020, 10:30 AM   I, Mirian Mo Tufford, am acting as Education administrator for [employer]. Mike Gip, MD, PhD.  I, Melissa C. Mike Gip, MD, have reviewed the above documentation for accuracy and completeness, and I agree with the above.

## 2020-05-28 ENCOUNTER — Ambulatory Visit: Admission: RE | Admit: 2020-05-28 | Payer: Medicare Other | Source: Ambulatory Visit

## 2020-05-28 LAB — CEA: CEA: 11.6 ng/mL — ABNORMAL HIGH (ref 0.0–4.7)

## 2020-05-28 NOTE — ED Provider Notes (Signed)
Nurse visit:  Asymptomatic. Desired COVID testing.  [facility]    [place], [place] 05/28/20 1320

## 2020-05-31 ENCOUNTER — Telehealth: Payer: Self-pay | Admitting: Radiation Oncology

## 2020-05-31 ENCOUNTER — Inpatient Hospital Stay: Payer: Medicare Other | Admitting: Hematology and Oncology

## 2020-05-31 DIAGNOSIS — Z7189 Other specified counseling: Secondary | ICD-10-CM

## 2020-05-31 DIAGNOSIS — G620 Drug-induced polyneuropathy: Secondary | ICD-10-CM

## 2020-05-31 DIAGNOSIS — C184 Malignant neoplasm of transverse colon: Secondary | ICD-10-CM

## 2020-05-31 DIAGNOSIS — C786 Secondary malignant neoplasm of retroperitoneum and peritoneum: Secondary | ICD-10-CM

## 2020-05-31 NOTE — Telephone Encounter (Signed)
Pt's wife [name]) would like to r/s pt's appt tomorrow 1/18 due to weather.

## 2020-06-01 ENCOUNTER — Ambulatory Visit: Payer: Medicare Other | Admitting: Radiation Oncology

## 2020-06-09 NOTE — Progress Notes (Signed)
[facility]  [address] [place], [place] [address] Phone: [phone]  Fax: [phone]   Clinic Day: 06/10/2020   Referring physician: Tracie Harrier, MD  Chief Complaint: Jared Gutierrez is a 80 y.o. male with metastaticcolon cancerwho is seen for 3 month assessment and review of interval CT scan.  HPI: The patient was last seen in the medical oncology clinic on 02/26/2020. At that time, he was doing well.  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 Hematocrit was 37.7, hemoglobin 12.6, MCV 92.4, platelets 172,000, WBC 4,800. Albumin was 3.4.  He completed SBRT on 02/23/2020.  The patient saw Dr. Baruch Gouty on 03/25/2020 for a follow-up 1 month after radiation. He had no significant side effects. Follow-up is on 06/01/2020.  Abdomen and pelvis CT with contrast on 05/27/2020 revealed the index soft tissue lesion just inferior and anterior to the pancreatic neck was stable to minimally increased (2.9 x 1.8 cm compared to 2.4 x 1.7 cm) since 01/14/2020.  There were no new interval findings.  There was mild circumferential wall thickening although nondistention of the bladder likely accentuated wall thickness.  There was hepatic steatosis.  There were bilateral renal cysts and aortic atherosclerosis.  Labs on 05/27/2020 revealed a hematocrit of 40.0, hemoglobin 13.2, platelets 196,000, WBC 5,500. AST was 42. CEA was 11.6.  During the interim, he has been "good." The neuropathy in his fingers and toes is stable. He is eating well and denies nausea, vomiting, and diarrhea. His knees are stiff.   Past Medical History:  Diagnosis Date  . Arthritis    OSTEOARTHRITIS  . Cancer ([place])   . Cavitary lesion of lung    RIGHT LOWER LOBE  . Chicken pox   . Colon cancer ([place])   . History of kidney stones   . Hypertension   . Lipoma of colon   . Nephrolithiasis   . Nephrolithiasis   . Obesity   . Shingles   . Tubular adenoma of colon    multiple  fragments    Past Surgical History:  Procedure Laterality Date  . COLON SURGERY    . COLONOSCOPY N/A 10/02/2014   Procedure: COLONOSCOPY;  Surgeon: Josefine Class, MD;  Location: [facility] ENDOSCOPY;  Service: Endoscopy;  Laterality: N/A;  . COLONOSCOPY WITH PROPOFOL N/A 02/16/2017   Procedure: COLONOSCOPY WITH PROPOFOL;  Surgeon: Manya Silvas, MD;  Location: [facility] ENDOSCOPY;  Service: Endoscopy;  Laterality: N/A;  . EUS N/A 04/17/2019   Procedure: FULL UPPER ENDOSCOPIC ULTRASOUND (EUS) RADIAL;  Surgeon: Holly Bodily, MD;  Location: [facility] ENDOSCOPY;  Service: Gastroenterology;  Laterality: N/A;  . KIDNEY STONE SURGERY    . PARTIAL COLECTOMY  10/17/2013  . PORTACATH PLACEMENT Right 06/13/2019   Procedure: INSERTION PORT-A-CATH;  Surgeon: Benjamine Sprague, DO;  Location: [facility] ORS;  Service: General;  Laterality: Right;    Family History  Problem Relation Age of Onset  . Cancer Mother   . Breast cancer Mother   . COPD Father     Social History:  reports that he has never smoked. He has never used smokeless tobacco. He reports current alcohol use. He reports that he does not use drugs. 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He is married and his wife's name is [name] [phone]).The patient is accompanied by his wife today.  Allergies: No Known Allergies  Current Medications: Current Outpatient Medications  Medication Sig Dispense Refill  . acetaminophen (TYLENOL) 500 MG tablet  Take 500 mg by mouth every 6 (six) hours as needed.    [name] amLODipine (NORVASC) 2.5 MG tablet Take 2.5 mg by mouth daily.    [name] aspirin EC 81 MG tablet Take 81 mg by mouth daily.    [name] olmesartan (BENICAR) 20 MG tablet Take 20 mg by mouth daily.    . cephALEXin (KEFLEX) 500 MG capsule Take 500 mg by mouth 3 (three) times daily.     . Cholecalciferol (VITAMIN D) 125 MCG (5000 UT) CAPS Take 5,000 mg by mouth.    . docusate sodium ([employer] STOOL SOFTENER) 100 MG capsule Take 100 mg  by mouth 2 (two) times daily.    [name] HYDROcodone-acetaminophen (NORCO/VICODIN) 5-325 MG tablet Take 1 tablet by mouth every 6 (six) hours as needed for moderate pain (up to 3 doses for moderate pain.).     [name] ibuprofen (ADVIL) 800 MG tablet Take 800 mg by mouth every 8 (eight) hours as needed. (Patient not taking: No sig reported)    . lidocaine-prilocaine (EMLA) cream Apply to affected area once 30 g 3  . loratadine (CLARITIN) 10 MG tablet Take 10 mg by mouth daily.    . ondansetron (ZOFRAN) 8 MG tablet Take 1 tablet (8 mg total) by mouth 2 (two) times daily as needed for refractory nausea / vomiting. Start on day 3 after chemotherapy. 30 tablet 1  . potassium chloride SA (KLOR-CON M20) 20 MEQ tablet Take by mouth.    . Probiotic Product (PROBIOTIC DAILY PO) Take 1 tablet by mouth daily.     No current facility-administered medications for this visit.   Facility-Administered Medications Ordered in Other Visits  Medication Dose Route Frequency Provider Last Rate Last Admin  . 0.9 %  sodium chloride infusion   Intravenous Once Corcoran, Melissa C, MD      . 0.9 %  sodium chloride infusion   Intravenous Continuous Lequita Asal, MD 10 mL/hr at 12/31/19 1000 New Bag at 12/31/19 1000  . sodium chloride flush (NS) 0.9 % injection 10 mL  10 mL Intracatheter PRN Lequita Asal, MD   10 mL at 10/03/19 1354   Review of Systems  Constitutional: Positive for weight loss (4 lbs). Negative for chills, diaphoresis, fever and malaise/fatigue.       Feels "good."  HENT: Negative for congestion, ear discharge, ear pain, hearing loss, nosebleeds, sinus pain, sore throat and tinnitus.   Eyes: Negative for blurred vision.  Respiratory: Negative for cough, hemoptysis, sputum production and shortness of breath.   Cardiovascular: Negative for chest pain, palpitations and leg swelling.  Gastrointestinal: Negative for abdominal pain, blood in stool, constipation, diarrhea, heartburn, melena, nausea and  vomiting.  Genitourinary: Negative for dysuria, frequency, hematuria and urgency.  Musculoskeletal: Negative for back pain, joint pain (stiff knees), myalgias and neck pain.  Skin: Negative for itching and rash.  Neurological: Positive for sensory change (neuropathy in fingers and toes, stable). Negative for dizziness, tingling, weakness and headaches.  Endo/Heme/Allergies: Does not bruise/bleed easily.  Psychiatric/Behavioral: Negative for depression and memory loss. The patient is not nervous/anxious and does not have insomnia.   All other systems reviewed and are negative.  Performance status (ECOG): 1  Vitals Blood pressure (!) 146/55, pulse 75, temperature 98.7 F (37.1 C), resp. rate 20, weight 266 lb 12.1 oz (121 kg), SpO2 100 %.   Physical Exam Vitals and nursing note reviewed.  Constitutional:      General: He is not in acute distress.    Appearance: Normal  appearance. He is well-developed. He is not diaphoretic.     Interventions: Face mask in place.     Comments: Cane by his side.  HENT:     Head: Normocephalic and atraumatic.     Comments: Gray hair and beard.    Mouth/Throat:     Mouth: Mucous membranes are moist.     Pharynx: Oropharynx is clear.  Eyes:     General: No scleral icterus.    Extraocular Movements: Extraocular movements intact.     Conjunctiva/sclera: Conjunctivae normal.     Pupils: Pupils are equal, round, and reactive to light.  Cardiovascular:     Rate and Rhythm: Normal rate and regular rhythm.     Heart sounds: Normal heart sounds. No murmur heard.   Pulmonary:     Effort: Pulmonary effort is normal. No respiratory distress.     Breath sounds: Normal breath sounds. No wheezing or rales.  Chest:     Chest wall: No tenderness.  Breasts:     Right: No axillary adenopathy or supraclavicular adenopathy.     Left: No axillary adenopathy or supraclavicular adenopathy.    Abdominal:     General: Bowel sounds are normal. There is no distension.      Palpations: Abdomen is soft. There is no mass.     Tenderness: There is no abdominal tenderness. There is no guarding or rebound.  Musculoskeletal:        General: No swelling or tenderness. Normal range of motion.     Cervical back: Normal range of motion and neck supple.     Right lower leg: Edema (trace) present.     Left lower leg: Edema (trace) present.  Lymphadenopathy:     Head:     Right side of head: No preauricular, posterior auricular or occipital adenopathy.     Left side of head: No preauricular, posterior auricular or occipital adenopathy.     Cervical: No cervical adenopathy.     Upper Body:     Right upper body: No supraclavicular or axillary adenopathy.     Left upper body: No supraclavicular or axillary adenopathy.     Lower Body: No right inguinal adenopathy. No left inguinal adenopathy.  Skin:    General: Skin is warm and dry.  Neurological:     Mental Status: He is alert and oriented to person, place, and time.  Psychiatric:        Behavior: Behavior normal.        Thought Content: Thought content normal.        Judgment: Judgment normal.    Imaging studies: 12/20/2018:  Chest, abdomen and pelvis CTrevealed postoperative findings of transverse colon resection without evidence of recurrent mass, definite lymphadenopathy, or distant metastatic disease to explain rising CEA. Therewereprominent sub-centimeter retroperitoneal and iliac lymph nodes, uncertain significance. There were postoperative findings about the left renal hilum, of uncertain nature, with a broad-based postoperative fat containing left-sided lumbar hernia.There was bibasilar bronchiectasis and scarring with multiple post infectious or inflammatory pneumatoceles, unchanged in comparison to CT date 06/23/2011. 04/02/2019:  PET scanrevealed a2.4 x 2.3 cm (SUV 11)hypermetabolic soft tissue density caudal and anterior to the pancreatic neckfavored to represent isolated peritoneal or nodal  metastasis in the setting of prior transverse colonic resection (expected primary drainage). Although this was immediately adjacent to the pancreas, a fat plane was maintained, arguing strongly against a pancreatic primary. Otherwise,there wasno evidence of hypermetabolic metastasis. 04/17/2019:  Upper endoscopic ultrasoundon 04/17/2019 revealeda normal esophagus, stomach, duodenum, and pancreas.There  was a 2.4 x 2.4 cm irregularmassin the retroperitoneum adjacent to, but not involving the pancreatic neck.  10/09/2019:  Abdomen and pelvis CT revealed a 2.4 x 2.3 cm lobulated nodule anterior to the pancreatic neck, previously FDG PET avid, not changed in size although decreased in internal attenuation suggesting treatment response. There was no evidence of new metastatic disease in the abdomen or pelvis.  There were unchanged prominent subcentimeter retroperitoneal and celiac axis lymph nodes, not previously FDG avid and nonspecific. There were postoperative findings about the left renal hilum with a broad-based left-sided lumbar hernia and hepatic steatosis. 12/05/2019:  Chest, abdomen and pelvic CT revealed mild decrease in size of previous FDG avid soft tissue attenuating lesion within the upper abdomen just inferior and anterior to the pancreatic neck (2.2 cm in maximum dimension vs 2.4 cm previously).  There was no new or progressive disease. 01/14/2020:  PET scan revealed an interval decrease in size and FDG uptake (2.4 x 2.3 cm with SUV 11 to 2.4 x 1.7 cm with SUV 4.09) associated with the previously referenced soft tissue density caudal and anterior to the pancreatic neck suggesting treatment response. There were no new sites of FDG avid tumor. 05/27/2020:  Abdomen and pelvis CT with contrast revealed the index soft tissue lesion just inferior and anterior to the pancreatic neck was stable to minimally increased (2.9 x 1.8 cm compared to 2.4 x 1.7 cm) since 01/14/2020.  There were no new  interval findings.  There was mild circumferential wall thickening although nondistention of the bladder likely accentuates wall thickness.  There was hepatic steatosis.  There were bilateral renal cysts and aortic atherosclerosis.   No visits with results within 3 Day(s) from this visit.  Latest known visit with results is:  Infusion on 05/27/2020  Component Date Value Ref Range Status  . CEA 05/27/2020 11.6* 0.0 - 4.7 ng/mL Final   Comment: (NOTE)                             Nonsmokers          <3.9                             Smokers             <5.6 [employer] Electrochemiluminescence Immunoassay (ECLIA) Values obtained with different assay methods or kits cannot be used interchangeably.  Results cannot be interpreted as absolute evidence of the presence or absence of malignant disease. Performed At: [facility] [place], [place] [id number] Rush Farmer MD LA:[id number]   . Sodium 05/27/2020 138  135 - 145 mmol/L Final  . Potassium 05/27/2020 3.9  3.5 - 5.1 mmol/L Final  . Chloride 05/27/2020 101  98 - 111 mmol/L Final  . CO2 05/27/2020 25  22 - 32 mmol/L Final  . Glucose, Bld 05/27/2020 104* 70 - 99 mg/dL Final   Glucose reference range applies only to samples taken after fasting for at least 8 hours.  . BUN 05/27/2020 12  8 - 23 mg/dL Final  . Creatinine, Ser 05/27/2020 0.79  0.61 - 1.24 mg/dL Final  . Calcium 05/27/2020 9.2  8.9 - 10.3 mg/dL Final  . Total Protein 05/27/2020 7.2  6.5 - 8.1 g/dL Final  . Albumin 05/27/2020 3.7  3.5 - 5.0 g/dL Final  . AST 05/27/2020 42* 15 - 41 U/L Final  . ALT 05/27/2020  36  0 - 44 U/L Final  . Alkaline Phosphatase 05/27/2020 59  38 - 126 U/L Final  . Total Bilirubin 05/27/2020 0.7  0.3 - 1.2 mg/dL Final  . GFR, Estimated 05/27/2020 >60  >60 mL/min Final   Comment: (NOTE) Calculated using the CKD-EPI Creatinine Equation (2021)   . Anion gap 05/27/2020 12  5 - 15 Final   Performed at [facility], [address] [place], [place] [address]  . WBC 05/27/2020 5.5  4.0 - 10.5 K/uL Final  . RBC 05/27/2020 4.30  4.22 - 5.81 MIL/uL Final  . Hemoglobin 05/27/2020 13.2  13.0 - 17.0 g/dL Final  . HCT 05/27/2020 40.0  39.0 - 52.0 % Final  . MCV 05/27/2020 93.0  80.0 - 100.0 fL Final  . MCH 05/27/2020 30.7  26.0 - 34.0 pg Final  . MCHC 05/27/2020 33.0  30.0 - 36.0 g/dL Final  . RDW 05/27/2020 12.4  11.5 - 15.5 % Final  . Platelets 05/27/2020 196  150 - 400 K/uL Final  . nRBC 05/27/2020 0.0  0.0 - 0.2 % Final  . Neutrophils Relative % 05/27/2020 66  % Final  . Neutro Abs 05/27/2020 3.6  1.7 - 7.7 K/uL Final  . Lymphocytes Relative 05/27/2020 22  % Final  . Lymphs Abs 05/27/2020 1.2  0.7 - 4.0 K/uL Final  . Monocytes Relative 05/27/2020 8  % Final  . Monocytes Absolute 05/27/2020 0.5  0.1 - 1.0 K/uL Final  . Eosinophils Relative 05/27/2020 3  % Final  . Eosinophils Absolute 05/27/2020 0.2  0.0 - 0.5 K/uL Final  . Basophils Relative 05/27/2020 1  % Final  . Basophils Absolute 05/27/2020 0.0  0.0 - 0.1 K/uL Final  . Immature Granulocytes 05/27/2020 0  % Final  . Abs Immature Granulocytes 05/27/2020 0.02  0.00 - 0.07 K/uL Final   Performed at [facility], [address], [place], [place] [address]    Assessment:  Jared Gutierrez is a 80 y.o. male  withmetastatic colon cancer. He was diagnosed with stage I colon cancers/p transverse colectomy on 10/17/2013. 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 Tumor extended into the submucosa. Margins were negative. There was lymphovascular invasion. 14 lymph nodes were negative.Pathologic stagewas T1 N0.  Colonoscopyon 10/02/2014 noted several 3 mm polyps and an 8 mm polyp in the cecum and transverse colon. Pathology revealed tubular adenomas negative for high-grade dysplasia and malignancy. Colonoscopyon 02/16/2017  revealed 2 diminutive polyps in the descending colon. Pathology revealed tubular adenomas without dysplasia or malignancy.  CEAhas been followed: 3.1 on 04/27/2014, 2.3 on 11/11/2014, 3.0 on 05/12/2015, 2.7 on 11/10/2015, 3.2 on 05/10/2016, 2.8 on 11/01/2016, 3.3 on 04/25/2017, 3.1 on 10/31/2017, 4.0 on 05/01/2018, 7.0on 11/28/2018, 7.4 on 12/13/2018, 10.5 on 03/25/2019, 17.8on 06/25/2019, 16.2 on 07/09/2019, 13.8 on 07/23/2019, 11.9 on 07/30/2019, 6.1 on 08/27/2019, 6.1 on 09/10/2019, 6.4 on 10/01/2019, 6.9 on 10/15/2019, 7.7 on 10/29/2019, 9.0 on 11/26/2019, 9.2 on 12/10/2019, 10.8 on 12/31/2019, and 11.6 on 05/27/2020.  PET scanon 04/02/2019 revealed a2.4 x 2.3 cm (SUV 11)hypermetabolic soft tissue density caudal and anterior to the pancreatic neckfavored to represent isolated peritoneal or nodal metastasis in the setting of prior transverse colonic resection (expected primary drainage). Although this was immediately adjacent to the pancreas, a fat plane was maintained, arguing strongly against a pancreatic primary. Otherwise,there wasno evidence of hypermetabolic metastasis.  Upper endoscopic ultrasoundon 04/17/2019 revealeda normal esophagus, stomach, duodenum,  and pancreas.There was a 2.4 x 2.4 cm irregularmassin the retroperitoneum adjacent to, but not involving the pancreatic neck. FNA and core needle biopsy were performed. Pathologyrevealed adenocarcinoma compatible with a metastatic lesion of colorectal origin. Tumor cells were positive for CK20 and CDX2andnegative for CK7.  Omniseqon 05/15/2019 revealed + KRAS and TP53. Negative results included BRAF V600E, Her2, NRAS, NTRK, PD-L1 (<1%), and TMB 8.7/Mb (intermediate). MMR testingfrom his colon resection on 10/16/2013 was intact with a low probability of MSI-H.  He received 11 cycles ofFOLFOXchemotherapy (07/09/2019- 08/27/2019; 09/17/2019 - 10/15/2019; 11/12/2019 - 12/17/2019) and 1 cycle of 5-FU and leucovorin  (12/31/2019).He received Neulasta after cycle #4 and #5 secondary to progressive leukopenia.  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  He received a truncated course of FOLFOX with cycle #11 secondary to oxaliplatin reaction.  PET scan on 01/14/2020 revealed an interval decrease in size and FDG uptake (2.4 x 2.3 cm with SUV 11 to 2.4 x 1.7 cm with SUV 4.09) associated with the previously referenced soft tissue density caudal and anterior to the pancreatic neck suggesting treatment response. There were no new sites of FDG avid tumor.  He received a hybrid 3-dimensional course of 3000 cGy in 10 fractions to the residual FDG avid mass from 02/10/2020 - 02/23/2020.   Abdomen and pelvis CT with contrast on 05/27/2020 revealed the index soft tissue lesion just inferior and anterior to the pancreatic neck was stable to minimally increased (2.9 x 1.8 cm compared to 2.4 x 1.7 cm) since 01/14/2020.  There were no new interval findings.    He has chronic right lower extremity edema. Right lower extremity duplexon 12/24/2018 revealed no DVT. He has a small right Baker's cyst.Bilateral lower extremityduplexon 08/16/2019 revealed no evidence of deep venous thrombosis in either lower extremity. Increased pulsatility of the venous waveforms bilaterally. These findings suggestedelevated right heart pressures. Differential considerations includedtricuspid regurgitation, right heart failure, pulmonary arterial hypertension and chronic COPD.  Hewas admitted to [facility] 08/16/2019 -08/17/2019 with right lower extremitycellulitis.He was unable to bear weight. Hewas treated withIV fluids, NSAIDs, colchicine,and broad antibiotics (vancomycin and Cefepime).He was discharged onindomethacin x 5 daysand Keflex 500 mg TID x 5 days.  He received the Moderna COVID-19 vaccine on 06/07/2019 and 07/05/2019. He received the flu shot on 02/03/2020.  Symptomatically, he feels "good." The neuropathy in his  fingers and toes is stable. He is eating well and denies nausea, vomiting, and diarrhea. Exam is stable.  Plan: 1.   Review labs from 05/27/2020. 2. Metastaticcolon cancer Symptomatically he is doing well.  Exam is unremarkable. He initially presented with stage I disease s/p resection. PET scan on 04/02/2019 revealeda2.4 x 2.3 cm (SUV 11)soft tissue density caudal and anterior to the pancreatic neck. 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 Lesion was not resectable. NGS revealed no targetable mutation. MMR on original resection revealed a a low probability of MSI-H. He declined Avastin. He received 11 cycles of FOLFOX chemotherapy and 1 cycle of 5-FU and leucovorin secondary to an oxaliplatin reaction. PET scan on 01/14/2020 revealed an isolated 2.4 cm lesion caudal and anterior to the pancreatic neck.  He underwent SBRT (completed 02/23/2020).  Review interval abdomen and pelvis CT on 05/27/2020.  Images personally reviewed.  Agree with radiology interpretation.   The lesion just inferior and anterior to the pancreatic neck was stable to minimally increased since 01/14/2020.  Discuss plan for follow-up imaging in 3 months.  Patient is agreeable.  Continue to monitor closely 3.  Chemotherapy induced neuropathy  He has a stable grade I neuropathy in his fingertips and toes.  He previously declined gabapentin.  Continue to monitor. 4.   Abdomen and pelvis CT on 08/30/2020. 5.   Port flush every 6-12 weeks. 6.   Labs prior to scan (CBC with diff, CMP, CEA). 7.   RTC after CT for MD assessment, review of labs and imaging studies.  I discussed the assessment and treatment plan with the patient.  The patient was provided an opportunity to ask questions and all were answered.  The patient agreed with the plan and  demonstrated an understanding of the instructions.  The patient was advised to call back if the symptoms worsen or if the condition fails to improve as anticipated.  I provided 12 minutes of face-to-face time during this this encounter and > 50% was spent counseling as documented under my assessment and plan.  An additional 10 minutes were spent reviewing his chart (Epic and Care Everywhere) including notes, labs, and imaging studies.    Lequita Asal, MD, PhD    06/10/2020, 2:08 PM   I, Mirian Mo Tufford, am acting as Education administrator for [employer]. Mike Gip, MD, PhD.  I, Andrzej Scully C. Mike Gip, MD, have reviewed the above documentation for accuracy and completeness, and I agree with the above.

## 2020-06-10 ENCOUNTER — Inpatient Hospital Stay: Payer: Medicare Other | Attending: Hematology and Oncology | Admitting: Hematology and Oncology

## 2020-06-10 ENCOUNTER — Other Ambulatory Visit: Payer: Self-pay

## 2020-06-10 ENCOUNTER — Encounter: Payer: Self-pay | Admitting: Hematology and Oncology

## 2020-06-10 VITALS — BP 146/55 | HR 75 | Temp 98.7°F | Resp 20 | Wt 266.8 lb

## 2020-06-10 DIAGNOSIS — G620 Drug-induced polyneuropathy: Secondary | ICD-10-CM | POA: Diagnosis not present

## 2020-06-10 DIAGNOSIS — T451X5D Adverse effect of antineoplastic and immunosuppressive drugs, subsequent encounter: Secondary | ICD-10-CM | POA: Diagnosis not present

## 2020-06-10 DIAGNOSIS — T451X5A Adverse effect of antineoplastic and immunosuppressive drugs, initial encounter: Secondary | ICD-10-CM

## 2020-06-10 DIAGNOSIS — C786 Secondary malignant neoplasm of retroperitoneum and peritoneum: Secondary | ICD-10-CM | POA: Insufficient documentation

## 2020-06-10 DIAGNOSIS — Z7982 Long term (current) use of aspirin: Secondary | ICD-10-CM | POA: Insufficient documentation

## 2020-06-10 DIAGNOSIS — Z791 Long term (current) use of non-steroidal anti-inflammatories (NSAID): Secondary | ICD-10-CM | POA: Diagnosis not present

## 2020-06-10 DIAGNOSIS — Z7189 Other specified counseling: Secondary | ICD-10-CM | POA: Diagnosis not present

## 2020-06-10 DIAGNOSIS — Z79899 Other long term (current) drug therapy: Secondary | ICD-10-CM | POA: Insufficient documentation

## 2020-06-10 DIAGNOSIS — I10 Essential (primary) hypertension: Secondary | ICD-10-CM | POA: Diagnosis not present

## 2020-06-10 DIAGNOSIS — E669 Obesity, unspecified: Secondary | ICD-10-CM | POA: Diagnosis not present

## 2020-06-10 DIAGNOSIS — C184 Malignant neoplasm of transverse colon: Secondary | ICD-10-CM | POA: Diagnosis not present

## 2020-06-28 ENCOUNTER — Ambulatory Visit
Admission: RE | Admit: 2020-06-28 | Discharge: 2020-06-28 | Disposition: A | Discharge status: Home / Self Care | Payer: Medicare Other | Source: Ambulatory Visit | Attending: Radiation Oncology | Admitting: Radiation Oncology

## 2020-06-28 VITALS — BP 150/84 | HR 62 | Temp 97.3°F | Resp 16 | Wt 268.5 lb

## 2020-06-28 DIAGNOSIS — C189 Malignant neoplasm of colon, unspecified: Secondary | ICD-10-CM

## 2020-06-28 DIAGNOSIS — C772 Secondary and unspecified malignant neoplasm of intra-abdominal lymph nodes: Secondary | ICD-10-CM

## 2020-06-28 NOTE — Progress Notes (Signed)
Radiation Oncology Follow up Note  Name: Jared Gutierrez   Date:   06/28/2020 MRN:  [id number] DOB: December 29, 1940    This 80 y.o. 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  Seen today in routine follow-up he is doing well.  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  She is specific specifically denies diarrhea abdominal pain or discomfort..  Repeat abdominal CT scan in January showed the soft tissue lesion inferior to the pancreatic neck was stable to minimally increased in size.  He is currently under observation  COMPLICATIONS OF TREATMENT: none  FOLLOW UP COMPLIANCE: keeps appointments   PHYSICAL EXAM:  BP (!) 150/84   Pulse 62   Temp (!) 97.3 F (36.3 C)   Resp 16   Wt 268 lb 8 oz (121.8 kg)   SpO2 100%   BMI 40.83 kg/m  Wheelchair-bound male in NAD.  Well-developed well-nourished patient in NAD. HEENT reveals PERLA, EOMI, discs not visualized.  Oral cavity is clear. No oral mucosal lesions are identified. Neck is clear without evidence of cervical or supraclavicular adenopathy. Lungs are clear to A&P. Cardiac examination is essentially unremarkable with regular rate and rhythm without murmur rub or thrill. Abdomen is benign with no organomegaly or masses noted. Motor sensory and DTR levels are equal and symmetric in the upper and lower extremities. Cranial nerves II through XII are grossly intact. Proprioception is intact. No peripheral adenopathy or edema is identified. No motor or sensory levels are noted. Crude visual fields are within normal range.  RADIOLOGY RESULTS: CT scan reviewed compatible with above-stated findings  PLAN:  Present time patient is fairly stable with no with no evidence of active disease he is currently under observation.  I am going to turn follow-up care over to medical oncology.  Be happy to reevaluate the patient anytime should further palliative treatment be indicated.  Patient comprehends my recommendations well.  I would like to take this opportunity to thank you for allowing me to participate in the care of your patient.Noreene Filbert, MD

## 2020-07-08 ENCOUNTER — Inpatient Hospital Stay: Payer: Medicare Other | Attending: Hematology and Oncology

## 2020-07-08 ENCOUNTER — Other Ambulatory Visit: Payer: Self-pay

## 2020-07-08 DIAGNOSIS — Z95828 Presence of other vascular implants and grafts: Secondary | ICD-10-CM

## 2020-07-08 DIAGNOSIS — T451X5D Adverse effect of antineoplastic and immunosuppressive drugs, subsequent encounter: Secondary | ICD-10-CM | POA: Diagnosis not present

## 2020-07-08 DIAGNOSIS — Z452 Encounter for adjustment and management of vascular access device: Secondary | ICD-10-CM | POA: Diagnosis present

## 2020-07-08 DIAGNOSIS — Z79899 Other long term (current) drug therapy: Secondary | ICD-10-CM | POA: Insufficient documentation

## 2020-07-08 DIAGNOSIS — E669 Obesity, unspecified: Secondary | ICD-10-CM | POA: Diagnosis not present

## 2020-07-08 DIAGNOSIS — Z7982 Long term (current) use of aspirin: Secondary | ICD-10-CM | POA: Insufficient documentation

## 2020-07-08 DIAGNOSIS — I10 Essential (primary) hypertension: Secondary | ICD-10-CM | POA: Insufficient documentation

## 2020-07-08 DIAGNOSIS — Z791 Long term (current) use of non-steroidal anti-inflammatories (NSAID): Secondary | ICD-10-CM | POA: Diagnosis not present

## 2020-07-08 DIAGNOSIS — C184 Malignant neoplasm of transverse colon: Secondary | ICD-10-CM | POA: Insufficient documentation

## 2020-07-08 DIAGNOSIS — G620 Drug-induced polyneuropathy: Secondary | ICD-10-CM | POA: Insufficient documentation

## 2020-07-08 DIAGNOSIS — C786 Secondary malignant neoplasm of retroperitoneum and peritoneum: Secondary | ICD-10-CM | POA: Diagnosis present

## 2020-07-08 MED ORDER — HEPARIN SOD (PORK) LOCK FLUSH 100 UNIT/ML IV SOLN
INTRAVENOUS | Status: AC
  Filled 2020-07-08: qty 5

## 2020-07-08 MED ORDER — SODIUM CHLORIDE 0.9% FLUSH
10.0000 mL | Freq: Once | INTRAVENOUS | Status: AC
  Administered 2020-07-08: 10 mL via INTRAVENOUS
  Filled 2020-07-08: qty 10

## 2020-07-08 MED ORDER — HEPARIN SOD (PORK) LOCK FLUSH 100 UNIT/ML IV SOLN
500.0000 [IU] | Freq: Once | INTRAVENOUS | Status: AC
  Administered 2020-07-08: 500 [IU] via INTRAVENOUS
  Filled 2020-07-08: qty 5

## 2020-08-02 DIAGNOSIS — I700 Atherosclerosis of aorta: Secondary | ICD-10-CM | POA: Insufficient documentation

## 2020-08-02 DIAGNOSIS — K760 Fatty (change of) liver, not elsewhere classified: Secondary | ICD-10-CM | POA: Insufficient documentation

## 2020-08-05 ENCOUNTER — Inpatient Hospital Stay: Payer: Medicare Other | Attending: Hematology and Oncology

## 2020-08-05 ENCOUNTER — Other Ambulatory Visit: Payer: Self-pay

## 2020-08-05 DIAGNOSIS — C786 Secondary malignant neoplasm of retroperitoneum and peritoneum: Secondary | ICD-10-CM | POA: Insufficient documentation

## 2020-08-05 DIAGNOSIS — Z452 Encounter for adjustment and management of vascular access device: Secondary | ICD-10-CM | POA: Insufficient documentation

## 2020-08-05 DIAGNOSIS — C184 Malignant neoplasm of transverse colon: Secondary | ICD-10-CM | POA: Diagnosis present

## 2020-08-05 DIAGNOSIS — Z95828 Presence of other vascular implants and grafts: Secondary | ICD-10-CM

## 2020-08-05 MED ORDER — HEPARIN SOD (PORK) LOCK FLUSH 100 UNIT/ML IV SOLN
INTRAVENOUS | Status: AC
  Filled 2020-08-05: qty 5

## 2020-08-05 MED ORDER — SODIUM CHLORIDE 0.9% FLUSH
10.0000 mL | Freq: Once | INTRAVENOUS | Status: AC
  Administered 2020-08-05: 10 mL via INTRAVENOUS
  Filled 2020-08-05: qty 10

## 2020-08-05 MED ORDER — HEPARIN SOD (PORK) LOCK FLUSH 100 UNIT/ML IV SOLN
500.0000 [IU] | Freq: Once | INTRAVENOUS | Status: AC
  Administered 2020-08-05: 500 [IU] via INTRAVENOUS
  Filled 2020-08-05: qty 5

## 2020-08-31 ENCOUNTER — Other Ambulatory Visit: Payer: Self-pay

## 2020-08-31 ENCOUNTER — Ambulatory Visit
Admission: RE | Admit: 2020-08-31 | Discharge: 2020-08-31 | Disposition: A | Discharge status: Home / Self Care | Payer: Medicare Other | Source: Ambulatory Visit | Attending: Hematology and Oncology | Admitting: Hematology and Oncology

## 2020-08-31 ENCOUNTER — Ambulatory Visit: Admission: RE | Admit: 2020-08-31 | Payer: Medicare Other | Source: Ambulatory Visit

## 2020-08-31 ENCOUNTER — Inpatient Hospital Stay: Payer: Medicare Other | Attending: Hematology and Oncology

## 2020-08-31 DIAGNOSIS — C184 Malignant neoplasm of transverse colon: Secondary | ICD-10-CM | POA: Diagnosis present

## 2020-08-31 DIAGNOSIS — C786 Secondary malignant neoplasm of retroperitoneum and peritoneum: Secondary | ICD-10-CM | POA: Insufficient documentation

## 2020-08-31 DIAGNOSIS — T451X5D Adverse effect of antineoplastic and immunosuppressive drugs, subsequent encounter: Secondary | ICD-10-CM | POA: Insufficient documentation

## 2020-08-31 DIAGNOSIS — G620 Drug-induced polyneuropathy: Secondary | ICD-10-CM | POA: Insufficient documentation

## 2020-08-31 LAB — CBC WITH DIFFERENTIAL/PLATELET
Abs Immature Granulocytes: 0.03 10*3/uL (ref 0.00–0.07)
Basophils Absolute: 0 10*3/uL (ref 0.0–0.1)
Basophils Relative: 1 %
Eosinophils Absolute: 0.1 10*3/uL (ref 0.0–0.5)
Eosinophils Relative: 2 %
HCT: 39.2 % (ref 39.0–52.0)
Hemoglobin: 13 g/dL (ref 13.0–17.0)
Immature Granulocytes: 1 %
Lymphocytes Relative: 24 %
Lymphs Abs: 1.4 10*3/uL (ref 0.7–4.0)
MCH: 30.7 pg (ref 26.0–34.0)
MCHC: 33.2 g/dL (ref 30.0–36.0)
MCV: 92.7 fL (ref 80.0–100.0)
Monocytes Absolute: 0.4 10*3/uL (ref 0.1–1.0)
Monocytes Relative: 8 %
Neutro Abs: 3.7 10*3/uL (ref 1.7–7.7)
Neutrophils Relative %: 64 %
Platelets: 211 10*3/uL (ref 150–400)
RBC: 4.23 MIL/uL (ref 4.22–5.81)
RDW: 12.6 % (ref 11.5–15.5)
WBC: 5.6 10*3/uL (ref 4.0–10.5)
nRBC: 0 % (ref 0.0–0.2)

## 2020-08-31 LAB — COMPREHENSIVE METABOLIC PANEL
ALT: 36 U/L (ref 0–44)
AST: 42 U/L — ABNORMAL HIGH (ref 15–41)
Albumin: 3.9 g/dL (ref 3.5–5.0)
Alkaline Phosphatase: 57 U/L (ref 38–126)
Anion gap: 10 (ref 5–15)
BUN: 10 mg/dL (ref 8–23)
CO2: 24 mmol/L (ref 22–32)
Calcium: 9.1 mg/dL (ref 8.9–10.3)
Chloride: 101 mmol/L (ref 98–111)
Creatinine, Ser: 0.78 mg/dL (ref 0.61–1.24)
GFR, Estimated: >60 mL/min (ref >60)
Glucose, Bld: 105 mg/dL — ABNORMAL HIGH (ref 70–99)
Potassium: 3.4 mmol/L — ABNORMAL LOW (ref 3.5–5.1)
Sodium: 135 mmol/L (ref 135–145)
Total Bilirubin: 0.9 mg/dL (ref 0.3–1.2)
Total Protein: 7 g/dL (ref 6.5–8.1)

## 2020-08-31 MED ORDER — IOHEXOL 300 MG/ML  SOLN
100.0000 mL | Freq: Once | INTRAMUSCULAR | Status: AC | PRN
  Administered 2020-08-31: 100 mL via INTRAVENOUS

## 2020-08-31 MED ORDER — HEPARIN SOD (PORK) LOCK FLUSH 100 UNIT/ML IV SOLN
500.0000 [IU] | Freq: Once | INTRAVENOUS | Status: AC
  Filled 2020-08-31: qty 5

## 2020-09-01 LAB — CEA: CEA: 30.9 ng/mL — ABNORMAL HIGH (ref 0.0–4.7)

## 2020-09-01 NOTE — Progress Notes (Signed)
[facility]  [address], [address] [place], [place] [address] Phone: [phone]  Fax: [phone]   Clinic Day: 09/02/2020   Referring physician: Tracie Harrier, MD  Chief Complaint: Jared Gutierrez is a 80 y.o. male with metastaticcolon cancerwho is seen for 3 month assessment and review of interval CT scan.  HPI: The patient was last seen in the medical oncology clinic on 06/10/2020. 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 He was eating well and denied nausea, vomiting, and diarrhea. Exam was stable. Hematocrit was 40.0, hemoglobin 13.2, platelets 196,000, WBC 5,500. AST was 42. CEA was 11.6.  The patient saw Dr. Baruch Gouty on 06/28/2020. He reported neuropathy. There was no evidence of active disease. The patient was to follow-up prn.  The patient saw Dr. Alice Reichert on 07/27/2020 for new patient assessment. He was scheduled for routine colonoscopy s/p tubular adenomas noted on his last colonoscopy in 2018.  Abdomen and pelvis CT on 08/31/2020 revealed increased size of the index soft tissue lesion inferior and anterior to the pancreatic neck (2.9 x 1.8 cm to 3.5 x 2.9 cm). There were similar prominent retroperitoneal lymph nodes without adenopathy by size criteria. There was no new or enlarging abdominal or pelvic lymph nodes. There were no new interval findings. There was similar circumferential wall thickening of a nondistended urinary bladder, which likely accentuated wall thickening There was hepatic steatosis and aortic atherosclerosis.  Labs on 08/31/2020 revealed a hematocrit of 39.2, hemoglobin 13.0, platelets 211,000, WBC 5,600. Potassium was 3.4. AST was 42. CEA is pending.  During the interim, he has been "good." He denies any new symptoms or complaints. He still has knee stiffness and tingling in his feet and fingers.  The patient would like to talk to his wife about additional imaging (PET scan) and whether  he would like treatment.   Past Medical History:  Diagnosis Date  . Arthritis    OSTEOARTHRITIS  . Cancer ([place])   . Cavitary lesion of lung    RIGHT LOWER LOBE  . Chicken pox   . Colon cancer ([place])   . History of kidney stones   . Hypertension   . Lipoma of colon   . Nephrolithiasis   . Nephrolithiasis   . Obesity   . Shingles   . Tubular adenoma of colon    multiple fragments    Past Surgical History:  Procedure Laterality Date  . COLON SURGERY    . COLONOSCOPY N/A 10/02/2014   Procedure: COLONOSCOPY;  Surgeon: Josefine Class, MD;  Location: [facility] ENDOSCOPY;  Service: Endoscopy;  Laterality: N/A;  . COLONOSCOPY WITH PROPOFOL N/A 02/16/2017   Procedure: COLONOSCOPY WITH PROPOFOL;  Surgeon: Manya Silvas, MD;  Location: [facility] ENDOSCOPY;  Service: Endoscopy;  Laterality: N/A;  . EUS N/A 04/17/2019   Procedure: FULL UPPER ENDOSCOPIC ULTRASOUND (EUS) RADIAL;  Surgeon: Holly Bodily, MD;  Location: [facility] ENDOSCOPY;  Service: Gastroenterology;  Laterality: N/A;  . KIDNEY STONE SURGERY    . PARTIAL COLECTOMY  10/17/2013  . PORTACATH PLACEMENT Right 06/13/2019   Procedure: INSERTION PORT-A-CATH;  Surgeon: Benjamine Sprague, DO;  Location: [facility] ORS;  Service: General;  Laterality: Right;    Family History  Problem Relation Age of Onset  . Cancer Mother   . Breast cancer Mother   . COPD Father     Social History:  reports that he has never smoked. He has never used smokeless tobacco. He reports current alcohol use. He  reports that he does not use drugs. 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He is married and his wife's name is Jared Gutierrez [phone]).The patient is alone today.  Allergies: No Known Allergies  Current Medications: Current Outpatient Medications  Medication Sig Dispense Refill  . acetaminophen (TYLENOL) 500 MG tablet Take 500 mg by mouth every 6 (six) hours as needed.    [name] amLODipine (NORVASC) 2.5 MG tablet Take 2.5 mg by  mouth daily.    [name] aspirin EC 81 MG tablet Take 81 mg by mouth daily.    . cephALEXin (KEFLEX) 500 MG capsule Take 500 mg by mouth 3 (three) times daily.     . Cholecalciferol (VITAMIN D) 125 MCG (5000 UT) CAPS Take 5,000 mg by mouth.    . docusate sodium (COLACE) 100 MG capsule Take 100 mg by mouth 2 (two) times daily.    [name] HYDROcodone-acetaminophen (NORCO/VICODIN) 5-325 MG tablet Take 1 tablet by mouth every 6 (six) hours as needed for moderate pain (up to 3 doses for moderate pain.).     [name] lidocaine-prilocaine (EMLA) cream Apply to affected area once 30 g 3  . loratadine (CLARITIN) 10 MG tablet Take 10 mg by mouth daily.    . NEOMYCIN-POLYMYXIN-HYDROCORTISONE (CORTISPORIN) 1 % SOLN OTIC solution SMARTSIG:Left Ear    . polyethylene glycol-electrolytes (NULYTELY) 420 g solution Take by mouth as directed.    . Probiotic Product (PROBIOTIC DAILY PO) Take 1 tablet by mouth daily.    [name] ibuprofen (ADVIL) 800 MG tablet Take 800 mg by mouth every 8 (eight) hours as needed. (Patient not taking: No sig reported)    . olmesartan (BENICAR) 20 MG tablet Take 20 mg by mouth daily. (Patient not taking: Reported on 09/02/2020)    . ondansetron (ZOFRAN) 8 MG tablet Take 1 tablet (8 mg total) by mouth 2 (two) times daily as needed for refractory nausea / vomiting. Start on day 3 after chemotherapy. (Patient not taking: Reported on 09/02/2020) 30 tablet 1  . potassium chloride SA (KLOR-CON) 20 MEQ tablet Take by mouth. (Patient not taking: Reported on 09/02/2020)     No current facility-administered medications for this visit.   Facility-Administered Medications Ordered in Other Visits  Medication Dose Route Frequency Provider Last Rate Last Admin  . 0.9 %  sodium chloride infusion   Intravenous Once Facundo Allemand C, MD      . 0.9 %  sodium chloride infusion   Intravenous Continuous Lequita Asal, MD 10 mL/hr at 12/31/19 1000 New Bag at 12/31/19 1000  . heparin lock flush 100 unit/mL  500 Units  Intravenous Once Maddox Hlavaty C, MD      . sodium chloride flush (NS) 0.9 % injection 10 mL  10 mL Intracatheter PRN Nolon Stalls C, MD   10 mL at 10/03/19 1354   Review of Systems  Constitutional: Negative for chills, diaphoresis, fever, malaise/fatigue and weight loss (up 4 lbs).       Feels "good."  HENT: Negative for congestion, ear discharge, ear pain, hearing loss, nosebleeds, sinus pain, sore throat and tinnitus.   Eyes: Negative for blurred vision.  Respiratory: Negative for cough, hemoptysis, sputum production and shortness of breath.   Cardiovascular: Negative for chest pain, palpitations and leg swelling.  Gastrointestinal: Negative for abdominal pain, blood in stool, constipation, diarrhea, heartburn, melena, nausea and vomiting.  Genitourinary: Negative for dysuria, frequency, hematuria and urgency.  Musculoskeletal: Positive for joint pain (stiff knees). Negative for back pain,  myalgias and neck pain.  Skin: Negative for itching and rash.  Neurological: Positive for sensory change (neuropathy in fingers and toes, stable). Negative for dizziness, tingling, weakness and headaches.  Endo/Heme/Allergies: Does not bruise/bleed easily.  Psychiatric/Behavioral: Negative for depression and memory loss. The patient is not nervous/anxious and does not have insomnia.   All other systems reviewed and are negative.  Performance status (ECOG): 1  Vitals Blood pressure (!) 150/78, pulse 61, temperature 97.7 F (36.5 C), resp. rate 20, weight 270 lb 11.6 oz (122.8 kg), SpO2 100 %.   Physical Exam Vitals and nursing note reviewed.  Constitutional:      General: He is not in acute distress.    Appearance: Normal appearance. He is well-developed. He is not diaphoretic.     Interventions: Face mask in place.     Comments: Cane by his side.  HENT:     Head: Normocephalic and atraumatic.     Comments: Gray hair and beard.    Mouth/Throat:     Mouth: Mucous membranes are moist.      Pharynx: Oropharynx is clear.  Eyes:     General: No scleral icterus.    Extraocular Movements: Extraocular movements intact.     Conjunctiva/sclera: Conjunctivae normal.     Pupils: Pupils are equal, round, and reactive to light.  Cardiovascular:     Rate and Rhythm: Normal rate and regular rhythm.     Heart sounds: Normal heart sounds. No murmur heard.   Pulmonary:     Effort: Pulmonary effort is normal. No respiratory distress.     Breath sounds: Normal breath sounds. No wheezing or rales.  Chest:     Chest wall: No tenderness.  Breasts:     Right: No axillary adenopathy or supraclavicular adenopathy.     Left: No axillary adenopathy or supraclavicular adenopathy.    Abdominal:     General: Bowel sounds are normal. There is no distension.     Palpations: Abdomen is soft. There is no mass.     Tenderness: There is no abdominal tenderness. There is no guarding or rebound.  Musculoskeletal:        General: No swelling or tenderness. Normal range of motion.     Cervical back: Normal range of motion and neck supple.     Right lower leg: No edema.     Left lower leg: No edema.  Lymphadenopathy:     Head:     Right side of head: No preauricular, posterior auricular or occipital adenopathy.     Left side of head: No preauricular, posterior auricular or occipital adenopathy.     Cervical: No cervical adenopathy.     Upper Body:     Right upper body: No supraclavicular or axillary adenopathy.     Left upper body: No supraclavicular or axillary adenopathy.     Lower Body: No right inguinal adenopathy. No left inguinal adenopathy.  Skin:    General: Skin is warm and dry.  Neurological:     Mental Status: He is alert and oriented to person, place, and time.  Psychiatric:        Behavior: Behavior normal.        Thought Content: Thought content normal.        Judgment: Judgment normal.    Imaging studies: 12/20/2018:  Chest, abdomen and pelvis CTrevealed postoperative  findings of transverse colon resection without evidence of recurrent mass, definite lymphadenopathy, or distant metastatic disease to explain rising CEA. Therewereprominent sub-centimeter retroperitoneal and iliac  lymph nodes, uncertain significance. There were postoperative findings about the left renal hilum, of uncertain nature, with a broad-based postoperative fat containing left-sided lumbar hernia.There was bibasilar bronchiectasis and scarring with multiple post infectious or inflammatory pneumatoceles, unchanged in comparison to CT date 06/23/2011. 04/02/2019:  PET scanrevealed a2.4 x 2.3 cm (SUV 11)hypermetabolic soft tissue density caudal and anterior to the pancreatic neckfavored to represent isolated peritoneal or nodal metastasis in the setting of prior transverse colonic resection (expected primary drainage). Although this was immediately adjacent to the pancreas, a fat plane was maintained, arguing strongly against a pancreatic primary. Otherwise,there wasno evidence of hypermetabolic metastasis. 04/17/2019:  Upper endoscopic ultrasoundon 04/17/2019 revealeda normal esophagus, stomach, duodenum, and pancreas.There was a 2.4 x 2.4 cm irregularmassin the retroperitoneum adjacent to, but not involving the pancreatic neck.  10/09/2019:  Abdomen and pelvis CT revealed a 2.4 x 2.3 cm lobulated nodule anterior to the pancreatic neck, previously FDG PET avid, not changed in size although decreased in internal attenuation suggesting treatment response. There was no evidence of new metastatic disease in the abdomen or pelvis.  There were unchanged prominent subcentimeter retroperitoneal and celiac axis lymph nodes, not previously FDG avid and nonspecific. There were postoperative findings about the left renal hilum with a broad-based left-sided lumbar hernia and hepatic steatosis. 12/05/2019:  Chest, abdomen and pelvic CT revealed mild decrease in size of previous FDG avid soft tissue  attenuating lesion within the upper abdomen just inferior and anterior to the pancreatic neck (2.2 cm in maximum dimension vs 2.4 cm previously).  There was no new or progressive disease. 01/14/2020:  PET scan revealed an interval decrease in size and FDG uptake (2.4 x 2.3 cm with SUV 11 to 2.4 x 1.7 cm with SUV 4.09) associated with the previously referenced soft tissue density caudal and anterior to the pancreatic neck suggesting treatment response. There were no new sites of FDG avid tumor. 05/27/2020:  Abdomen and pelvis CT revealed the index soft tissue lesion just inferior and anterior to the pancreatic neck was stable to minimally increased (2.9 x 1.8 cm compared to 2.4 x 1.7 cm) since 01/14/2020.  There were no new interval findings.  There was mild circumferential wall thickening although nondistention of the bladder likely accentuates wall thickness.  There was hepatic steatosis.  There were bilateral renal cysts and aortic atherosclerosis. 08/31/2020:  Abdomen and pelvis CT revealed increased size of the index soft tissue lesion inferior and anterior to the pancreatic neck (2.9 x 1.8 cm to 3.5 x 2.9 cm). There were similar prominent retroperitoneal lymph nodes without adenopathy by size criteria. There was no new or enlarging abdominal or pelvic lymph nodes. There were no new interval findings. There was similar circumferential wall thickening of a nondistended urinary bladder, which likely accentuated wall thickening There was hepatic steatosis and aortic atherosclerosis.   Infusion on 08/31/2020  Component Date Value Ref Range Status  . WBC 08/31/2020 5.6  4.0 - 10.5 K/uL Final  . RBC 08/31/2020 4.23  4.22 - 5.81 MIL/uL Final  . Hemoglobin 08/31/2020 13.0  13.0 - 17.0 g/dL Final  . HCT 08/31/2020 39.2  39.0 - 52.0 % Final  . MCV 08/31/2020 92.7  80.0 - 100.0 fL Final  . MCH 08/31/2020 30.7  26.0 - 34.0 pg Final  . MCHC 08/31/2020 33.2  30.0 - 36.0 g/dL Final  . RDW 08/31/2020 12.6  11.5 -  15.5 % Final  . Platelets 08/31/2020 211  150 - 400 K/uL Final  . nRBC 08/31/2020  0.0  0.0 - 0.2 % Final  . Neutrophils Relative % 08/31/2020 64  % Final  . Neutro Abs 08/31/2020 3.7  1.7 - 7.7 K/uL Final  . Lymphocytes Relative 08/31/2020 24  % Final  . Lymphs Abs 08/31/2020 1.4  0.7 - 4.0 K/uL Final  . Monocytes Relative 08/31/2020 8  % Final  . Monocytes Absolute 08/31/2020 0.4  0.1 - 1.0 K/uL Final  . Eosinophils Relative 08/31/2020 2  % Final  . Eosinophils Absolute 08/31/2020 0.1  0.0 - 0.5 K/uL Final  . Basophils Relative 08/31/2020 1  % Final  . Basophils Absolute 08/31/2020 0.0  0.0 - 0.1 K/uL Final  . Immature Granulocytes 08/31/2020 1  % Final  . Abs Immature Granulocytes 08/31/2020 0.03  0.00 - 0.07 K/uL Final   Performed at [facility], [address] [place], [place] [address]  . Sodium 08/31/2020 135  135 - 145 mmol/L Final  . Potassium 08/31/2020 3.4* 3.5 - 5.1 mmol/L Final  . Chloride 08/31/2020 101  98 - 111 mmol/L Final  . CO2 08/31/2020 24  22 - 32 mmol/L Final  . Glucose, Bld 08/31/2020 105* 70 - 99 mg/dL Final   Glucose reference range applies only to samples taken after fasting for at least 8 hours.  . BUN 08/31/2020 10  8 - 23 mg/dL Final  . Creatinine, Ser 08/31/2020 0.78  0.61 - 1.24 mg/dL Final  . Calcium 08/31/2020 9.1  8.9 - 10.3 mg/dL Final  . Total Protein 08/31/2020 7.0  6.5 - 8.1 g/dL Final  . Albumin 08/31/2020 3.9  3.5 - 5.0 g/dL Final  . AST 08/31/2020 42* 15 - 41 U/L Final  . ALT 08/31/2020 36  0 - 44 U/L Final  . Alkaline Phosphatase 08/31/2020 57  38 - 126 U/L Final  . Total Bilirubin 08/31/2020 0.9  0.3 - 1.2 mg/dL Final  . GFR, Estimated 08/31/2020 >60  >60 mL/min Final   Comment: (NOTE) Calculated using the CKD-EPI Creatinine Equation (2021)   . Anion gap 08/31/2020 10  5 - 15 Final   Performed at [facility], [address], [place], [place] [address]  . CEA 08/31/2020 30.9* 0.0 - 4.7 ng/mL Final   Comment:  (NOTE)                             Nonsmokers          <3.9                             Smokers             <5.6 [employer] Electrochemiluminescence Immunoassay (ECLIA) Values obtained with different assay methods or kits cannot be used interchangeably.  Results cannot be interpreted as absolute evidence of the presence or absence of malignant disease. Performed At: [facility] [address] [place], [place] [id number] Rush Farmer MD IE:[id number]     Assessment:  Jared Gutierrez is a 80 y.o. male  withmetastatic colon cancer. He was diagnosed with stage I colon cancers/p transverse colectomy on 10/17/2013. 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 Tumor extended into the submucosa. Margins were negative. There was lymphovascular invasion. 14 lymph nodes were negative.Pathologic stagewas T1 N0.  Colonoscopyon 10/02/2014 noted several 3 mm polyps and an 8 mm polyp in the cecum and  transverse colon. Pathology revealed tubular adenomas negative for high-grade dysplasia and malignancy. Colonoscopyon 02/16/2017 revealed 2 diminutive polyps in the descending colon. Pathology revealed tubular adenomas without dysplasia or malignancy.  CEAhas been followed: 3.1 on 04/27/2014, 2.3 on 11/11/2014, 3.0 on 05/12/2015, 2.7 on 11/10/2015, 3.2 on 05/10/2016, 2.8 on 11/01/2016, 3.3 on 04/25/2017, 3.1 on 10/31/2017, 4.0 on 05/01/2018, 7.0on 11/28/2018, 7.4 on 12/13/2018, 10.5 on 03/25/2019, 17.8on 06/25/2019, 16.2 on 07/09/2019, 13.8 on 07/23/2019, 11.9 on 07/30/2019, 6.1 on 08/27/2019, 6.1 on 09/10/2019, 6.4 on 10/01/2019, 6.9 on 10/15/2019, 7.7 on 10/29/2019, 9.0 on 11/26/2019, 9.2 on 12/10/2019, 10.8 on 12/31/2019, 11.6 on 05/27/2020, and 30.9 on 08/31/2020.  PET scanon 04/02/2019 revealed a2.4 x 2.3 cm (SUV 11)hypermetabolic soft tissue density caudal and anterior to  the pancreatic neckfavored to represent isolated peritoneal or nodal metastasis in the setting of prior transverse colonic resection (expected primary drainage). Although this was immediately adjacent to the pancreas, a fat plane was maintained, arguing strongly against a pancreatic primary. Otherwise,there wasno evidence of hypermetabolic metastasis.  Upper endoscopic ultrasoundon 04/17/2019 revealeda normal esophagus, stomach, duodenum, and pancreas.There was a 2.4 x 2.4 cm irregularmassin the retroperitoneum adjacent to, but not involving the pancreatic neck. FNA and core needle biopsy were performed. Pathologyrevealed adenocarcinoma compatible with a metastatic lesion of colorectal origin. Tumor cells were positive for CK20 and CDX2andnegative for CK7.  Omniseqon 05/15/2019 revealed + KRAS and TP53. Negative results included BRAF V600E, Her2, NRAS, NTRK, PD-L1 (<1%), and TMB 8.7/Mb (intermediate). MMR testingfrom his colon resection on 10/16/2013 was intact with a low probability of MSI-H.  He received 11 cycles ofFOLFOXchemotherapy (07/09/2019- 08/27/2019; 09/17/2019 - 10/15/2019; 11/12/2019 - 12/17/2019) and 1 cycle of 5-FU and leucovorin (12/31/2019).He received Neulasta after cycle #4 and #5 secondary to progressive leukopenia.  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  He received a truncated course of FOLFOX with cycle #11 secondary to oxaliplatin reaction.  PET scan on 01/14/2020 revealed an interval decrease in size and FDG uptake (2.4 x 2.3 cm with SUV 11 to 2.4 x 1.7 cm with SUV 4.09) associated with the previously referenced soft tissue density caudal and anterior to the pancreatic neck suggesting treatment response. There were no new sites of FDG avid tumor.  He received a hybrid 3-dimensional course of 3000 cGy in 10 fractions to the residual FDG avid mass from 02/10/2020 - 02/23/2020.   Abdomen and pelvis CT on 05/27/2020 revealed the index soft tissue  lesion just inferior and anterior to the pancreatic neck was stable to minimally increased (2.9 x 1.8 cm compared to 2.4 x 1.7 cm) since 01/14/2020.  There were no new interval findings.    Abdomen and pelvis CT on 08/31/2020 revealed increased size of the index soft tissue lesion inferior and anterior to the pancreatic neck (2.9 x 1.8 cm to 3.5 x 2.9 cm). There were similar prominent retroperitoneal lymph nodes without adenopathy by size criteria. There was no new or enlarging abdominal or pelvic lymph nodes. There were no new interval findings. There was similar circumferential wall thickening of a nondistended urinary bladder, which likely accentuated wall thickening There was hepatic steatosis and aortic atherosclerosis.  He has chronic right lower extremity edema. Right lower extremity duplexon 12/24/2018 revealed no DVT. He has a small right Baker's cyst.Bilateral lower extremityduplexon 08/16/2019 revealed no evidence of deep venous thrombosis in either lower extremity. Increased pulsatility of the venous waveforms bilaterally. These findings suggestedelevated right heart pressures. Differential considerations includedtricuspid regurgitation, right heart failure, pulmonary arterial hypertension and chronic COPD.  Hewas admitted to [facility] 08/16/2019 -08/17/2019 with right lower extremitycellulitis.He was unable to bear weight. Hewas treated withIV fluids, NSAIDs, colchicine,and broad antibiotics (vancomycin and Cefepime).He was discharged onindomethacin x 5 daysand Keflex 500 mg TID x 5 days.  He received the Moderna COVID-19 vaccine on 06/07/2019 and 07/05/2019. He received the flu shot on 02/03/2020.  Symptomatically, he feels "good." He denies any new symptoms or complaints. He still has knee stiffness and tingling in his feet and fingers.  Exam is stable.  CEA was 30.9 on 08/31/2020.  Plan: 1.   Review labs from 08/31/2020. 2. Metastaticcolon  cancer Symptomatically, he feels good.  Exam is unremarkable. He initially presented with stage I disease s/p resection. PET scan on 04/02/2019 revealeda2.4 x 2.3 cm (SUV 11)soft tissue density caudal and anterior to the pancreatic neck. 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 Lesion was not resectable. NGS revealed no targetable mutation. MMR on original resection revealed a a low probability of MSI-H. He declined Avastin. He received 11 cycles of FOLFOX chemotherapy and 1 cycle of 5-FU and leucovorin secondary to an oxaliplatin reaction. PET scan on 01/14/2020 revealed an isolated 2.4 cm lesion caudal and anterior to the pancreatic neck.   He underwent SBRT (completed 02/23/2020).  Abdomen and pelvis CT on 05/27/2020 revealed the lesion just inferior and anterior to the pancreatic neck was stable to minimally increased since 01/14/2020.  Abdomen and pelvis CT on 08/31/2020 was personally reviewed.  Agree with radiology findings.   The soft tissue lesion inferior and anterior to the pancreatic neck has increased to 3.5 cm.   There are prominent retroperitoneal nodes.  Discuss clear progression of disease and consideration of treatment.   Discuss consideration of FOLFIRI chemotherapy.   Potential side effects reviewed.  Information provided.    Patient considering.  He would like to discuss with his wife.   Consider baseline PET scan to assess full extent of disease before initiation of treatment.  Present at tumor board on 09/09/2020. 3.   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  He previously declined gabapentin.  Continue to monitor. 4.   Tumor board on 09/09/2020. 5.   MD to contact patient after tumor board. 6.   Preauth: FOLFIRI. 7.   RTC in 2 weeks for MD  assessment and decision regarding direction of therapy.  I discussed the assessment and treatment plan with the patient.  The patient was provided an opportunity to ask questions and all were answered.  The patient agreed with the plan and demonstrated an understanding of the instructions.  The patient was advised to call back if the symptoms worsen or if the condition fails to improve as anticipated.  I provided 22 minutes of face-to-face time during this this encounter and > 50% was spent counseling as documented under my assessment and plan.  An additional 10+ minutes were spent reviewing his chart (Epic and Care Everywhere) including notes, labs, and imaging studies.    Lequita Asal, MD, PhD    09/02/2020, 11:08 AM   I, Mirian Mo Tufford, am acting as Education administrator for [employer]. Mike Gip, MD, PhD.  I, Amire Leazer C. Mike Gip, MD, have reviewed the above documentation for accuracy and completeness, and I agree with the above.

## 2020-09-02 ENCOUNTER — Encounter: Payer: Self-pay | Admitting: Hematology and Oncology

## 2020-09-02 ENCOUNTER — Other Ambulatory Visit: Payer: Self-pay

## 2020-09-02 ENCOUNTER — Inpatient Hospital Stay (HOSPITAL_BASED_OUTPATIENT_CLINIC_OR_DEPARTMENT_OTHER): Payer: Medicare Other | Admitting: Hematology and Oncology

## 2020-09-02 VITALS — BP 150/78 | HR 61 | Temp 97.7°F | Resp 20 | Wt 270.7 lb

## 2020-09-02 DIAGNOSIS — Z7189 Other specified counseling: Secondary | ICD-10-CM

## 2020-09-02 DIAGNOSIS — G620 Drug-induced polyneuropathy: Secondary | ICD-10-CM | POA: Diagnosis not present

## 2020-09-02 DIAGNOSIS — C184 Malignant neoplasm of transverse colon: Secondary | ICD-10-CM

## 2020-09-02 DIAGNOSIS — C786 Secondary malignant neoplasm of retroperitoneum and peritoneum: Secondary | ICD-10-CM | POA: Diagnosis not present

## 2020-09-02 DIAGNOSIS — T451X5D Adverse effect of antineoplastic and immunosuppressive drugs, subsequent encounter: Secondary | ICD-10-CM | POA: Diagnosis not present

## 2020-09-02 DIAGNOSIS — T451X5A Adverse effect of antineoplastic and immunosuppressive drugs, initial encounter: Secondary | ICD-10-CM

## 2020-09-02 NOTE — Patient Instructions (Addendum)
Please discuss with your wife additional imaging (chest CT or PET scan).  Discuss FOLFIRI chemotherapy  Present at tumor board on 09/09/2020.   Irinotecan injection What is this medicine? IRINOTECAN (ir in oh TEE kan ) is a chemotherapy drug. It is used to treat colon and rectal cancer. This medicine may be used for other purposes; ask your health care provider or pharmacist if you have questions. COMMON BRAND NAME(S): Camptosar What should I tell my health care provider before I take this medicine? They need to know if you have any of these conditions:  dehydration  diarrhea  infection (especially a virus infection such as chickenpox, cold sores, or herpes)  liver disease  low blood counts, like low white cell, platelet, or red cell counts  low levels of calcium, magnesium, or potassium in the blood  recent or ongoing radiation therapy  an unusual or allergic reaction to irinotecan, other medicines, foods, dyes, or preservatives  pregnant or trying to get pregnant  breast-feeding How should I use this medicine? This drug is given as an infusion into a vein. It is administered in a hospital or clinic by a specially trained health care professional. Talk to your pediatrician regarding the use of this medicine in children. Special care may be needed. Overdosage: If you think you have taken too much of this medicine contact a poison control center or emergency room at once. NOTE: This medicine is only for you. Do not share this medicine with others. What if I miss a dose? It is important not to miss your dose. Call your doctor or health care professional if you are unable to keep an appointment. What may interact with this medicine? Do not take this medicine with any of the following medications:  cobicistat  itraconazole This medicine may interact with the following medications:  antiviral medicines for HIV or AIDS  certain antibiotics like rifampin or  rifabutin  certain medicines for fungal infections like ketoconazole, posaconazole, and voriconazole  certain medicines for seizures like carbamazepine, phenobarbital, phenotoin  clarithromycin  gemfibrozil  nefazodone  St. John's Wort This list may not describe all possible interactions. Give your health care provider a list of all the medicines, herbs, non-prescription drugs, or dietary supplements you use. Also tell them if you smoke, drink alcohol, or use illegal drugs. Some items may interact with your medicine. What should I watch for while using this medicine? Your condition will be monitored carefully while you are receiving this medicine. You will need important blood work done while you are taking this medicine. This drug may make you feel generally unwell. This is not uncommon, as chemotherapy can affect healthy cells as well as cancer cells. Report any side effects. Continue your course of treatment even though you feel ill unless your doctor tells you to stop. In some cases, you may be given additional medicines to help with side effects. Follow all directions for their use. You may get drowsy or dizzy. Do not drive, use machinery, or do anything that needs mental alertness until you know how this medicine affects you. Do not stand or sit up quickly, especially if you are an older patient. This reduces the risk of dizzy or fainting spells. Call your health care professional for advice if you get a fever, chills, or sore throat, or other symptoms of a cold or flu. Do not treat yourself. This medicine decreases your body's ability to fight infections. Try to avoid being around people who are sick. Avoid taking products  that contain aspirin, acetaminophen, ibuprofen, naproxen, or ketoprofen unless instructed by your doctor. These medicines may hide a fever. This medicine may increase your risk to bruise or bleed. Call your doctor or health care professional if you notice any unusual  bleeding. Be careful brushing and flossing your teeth or using a toothpick because you may get an infection or bleed more easily. If you have any dental work done, tell your dentist you are receiving this medicine. Do not become pregnant while taking this medicine or for 6 months after stopping it. Women should inform their health care professional if they wish to become pregnant or think they might be pregnant. Men should not father a child while taking this medicine and for 3 months after stopping it. There is potential for serious side effects to an unborn child. Talk to your health care professional for more information. Do not breast-feed an infant while taking this medicine or for 7 days after stopping it. This medicine has caused ovarian failure in some women. This medicine may make it more difficult to get pregnant. Talk to your health care professional if you are concerned about your fertility. This medicine has caused decreased sperm counts in some men. This may make it more difficult to father a child. Talk to your health care professional if you are concerned about your fertility. What side effects may I notice from receiving this medicine? Side effects that you should report to your doctor or health care professional as soon as possible:  allergic reactions like skin rash, itching or hives, swelling of the face, lips, or tongue  chest pain  diarrhea  flushing, runny nose, sweating during infusion  low blood counts - this medicine may decrease the number of white blood cells, red blood cells and platelets. You may be at increased risk for infections and bleeding.  nausea, vomiting  pain, swelling, warmth in the leg  signs of decreased platelets or bleeding - bruising, pinpoint red spots on the skin, black, tarry stools, blood in the urine  signs of infection - fever or chills, cough, sore throat, pain or difficulty passing urine  signs of decreased red blood cells - unusually  weak or tired, fainting spells, lightheadedness Side effects that usually do not require medical attention (report to your doctor or health care professional if they continue or are bothersome):  constipation  hair loss  headache  loss of appetite  mouth sores  stomach pain This list may not describe all possible side effects. Call your doctor for medical advice about side effects. You may report side effects to FDA at 1-800-FDA-1088. Where should I keep my medicine? This drug is given in a hospital or clinic and will not be stored at home. NOTE: This sheet is a summary. It may not cover all possible information. If you have questions about this medicine, talk to your doctor, pharmacist, or health care provider.  2021 Elsevier/Gold Standard (2019-04-01 17:46:13)   Fluorouracil, 5-FU injection What is this medicine? FLUOROURACIL, 5-FU (flure oh YOOR a sil) is a chemotherapy drug. It slows the growth of cancer cells. This medicine is used to treat many types of cancer like breast cancer, colon or rectal cancer, pancreatic cancer, and stomach cancer. This medicine may be used for other purposes; ask your health care provider or pharmacist if you have questions. COMMON BRAND NAME(S): Adrucil What should I tell my health care provider before I take this medicine? They need to know if you have any of these conditions:  blood disorders  dihydropyrimidine dehydrogenase (DPD) deficiency  infection (especially a virus infection such as chickenpox, cold sores, or herpes)  kidney disease  liver disease  malnourished, poor nutrition  recent or ongoing radiation therapy  an unusual or allergic reaction to fluorouracil, other chemotherapy, other medicines, foods, dyes, or preservatives  pregnant or trying to get pregnant  breast-feeding How should I use this medicine? This drug is given as an infusion or injection into a vein. It is administered in a hospital or clinic by a  specially trained health care professional. Talk to your pediatrician regarding the use of this medicine in children. Special care may be needed. Overdosage: If you think you have taken too much of this medicine contact a poison control center or emergency room at once. NOTE: This medicine is only for you. Do not share this medicine with others. What if I miss a dose? It is important not to miss your dose. Call your doctor or health care professional if you are unable to keep an appointment. What may interact with this medicine? Do not take this medicine with any of the following medications:  live virus vaccines This medicine may also interact with the following medications:  medicines that treat or prevent blood clots like warfarin, enoxaparin, and dalteparin This list may not describe all possible interactions. Give your health care provider a list of all the medicines, herbs, non-prescription drugs, or dietary supplements you use. Also tell them if you smoke, drink alcohol, or use illegal drugs. Some items may interact with your medicine. What should I watch for while using this medicine? Visit your doctor for checks on your progress. This drug may make you feel generally unwell. This is not uncommon, as chemotherapy can affect healthy cells as well as cancer cells. Report any side effects. Continue your course of treatment even though you feel ill unless your doctor tells you to stop. In some cases, you may be given additional medicines to help with side effects. Follow all directions for their use. Call your doctor or health care professional for advice if you get a fever, chills or sore throat, or other symptoms of a cold or flu. Do not treat yourself. This drug decreases your body's ability to fight infections. Try to avoid being around people who are sick. This medicine may increase your risk to bruise or bleed. Call your doctor or health care professional if you notice any unusual  bleeding. Be careful brushing and flossing your teeth or using a toothpick because you may get an infection or bleed more easily. If you have any dental work done, tell your dentist you are receiving this medicine. Avoid taking products that contain aspirin, acetaminophen, ibuprofen, naproxen, or ketoprofen unless instructed by your doctor. These medicines may hide a fever. Do not become pregnant while taking this medicine. Women should inform their doctor if they wish to become pregnant or think they might be pregnant. There is a potential for serious side effects to an unborn child. Talk to your health care professional or pharmacist for more information. Do not breast-feed an infant while taking this medicine. Men should inform their doctor if they wish to father a child. This medicine may lower sperm counts. Do not treat diarrhea with over the counter products. Contact your doctor if you have diarrhea that lasts more than 2 days or if it is severe and watery. This medicine can make you more sensitive to the sun. Keep out of the sun. If you cannot avoid  being in the sun, wear protective clothing and use sunscreen. Do not use sun lamps or tanning beds/booths. What side effects may I notice from receiving this medicine? Side effects that you should report to your doctor or health care professional as soon as possible:  allergic reactions like skin rash, itching or hives, swelling of the face, lips, or tongue  low blood counts - this medicine may decrease the number of white blood cells, red blood cells and platelets. You may be at increased risk for infections and bleeding.  signs of infection - fever or chills, cough, sore throat, pain or difficulty passing urine  signs of decreased platelets or bleeding - bruising, pinpoint red spots on the skin, black, tarry stools, blood in the urine  signs of decreased red blood cells - unusually weak or tired, fainting spells, lightheadedness  breathing  problems  changes in vision  chest pain  mouth sores  nausea and vomiting  pain, swelling, redness at site where injected  pain, tingling, numbness in the hands or feet  redness, swelling, or sores on hands or feet  stomach pain  unusual bleeding Side effects that usually do not require medical attention (report to your doctor or health care professional if they continue or are bothersome):  changes in finger or toe nails  diarrhea  dry or itchy skin  hair loss  headache  loss of appetite  sensitivity of eyes to the light  stomach upset  unusually teary eyes This list may not describe all possible side effects. Call your doctor for medical advice about side effects. You may report side effects to FDA at 1-800-FDA-1088. Where should I keep my medicine? This drug is given in a hospital or clinic and will not be stored at home. NOTE: This sheet is a summary. It may not cover all possible information. If you have questions about this medicine, talk to your doctor, pharmacist, or health care provider.  2021 [employer]vier/Gold Standard (2019-04-01 15:00:03)

## 2020-09-02 NOTE — Progress Notes (Signed)
Pt states he is still having numbness and tingling on his hands.

## 2020-09-09 ENCOUNTER — Other Ambulatory Visit: Payer: Medicare Other

## 2020-09-10 NOTE — Progress Notes (Signed)
Tumor Board Documentation  Jared Gutierrez was presented by Dr Grayland Ormond at our Tumor Board on 09/09/2020, which included representatives from medical oncology,radiation oncology,surgical oncology,surgical,radiology,pathology,navigation,internal medicine,palliative care,research,pharmacy,pulmonology.  Jared Gutierrez currently presents as a current patient,for discussion,for progression with history of the following treatments: neoadjuvant chemoradiation.  Additionally, we reviewed previous medical and familial history, history of present illness, and recent lab results along with all available histopathologic and imaging studies. The tumor board considered available treatment options and made the following recommendations:   If patient wants treatment will need a PET Scan, If does not want treatment, Will plan to repeat CT scan in 3 - 4 months  The following procedures/referrals were also placed: No orders of the defined types were placed in this encounter.   Clinical Trial Status: not discussed   Staging used:    AJCC Staging:       Group: Metastatic Colon Cancer   National site-specific guidelines   were discussed with respect to the case.  Tumor board is a meeting of clinicians from various specialty areas who evaluate and discuss patients for whom a multidisciplinary approach is being considered. Final determinations in the plan of care are those of the provider(s). The responsibility for follow up of recommendations given during tumor board is that of the provider.   Today's extended care, comprehensive team conference, Jared Gutierrez was not present for the discussion and was not examined.   Multidisciplinary Tumor Board is a multidisciplinary case peer review process.  Decisions discussed in the Multidisciplinary Tumor Board reflect the opinions of the specialists present at the conference without having examined the patient.  Ultimately, treatment and diagnostic decisions rest with the primary  provider(s) and the patient.

## 2020-09-16 ENCOUNTER — Inpatient Hospital Stay: Payer: Medicare Other | Attending: Nurse Practitioner | Admitting: Nurse Practitioner

## 2020-09-16 ENCOUNTER — Encounter: Payer: Self-pay | Admitting: Nurse Practitioner

## 2020-09-16 ENCOUNTER — Other Ambulatory Visit: Payer: Self-pay

## 2020-09-16 VITALS — BP 138/63 | HR 68 | Temp 97.5°F | Resp 20 | Wt 265.1 lb

## 2020-09-16 DIAGNOSIS — C786 Secondary malignant neoplasm of retroperitoneum and peritoneum: Secondary | ICD-10-CM

## 2020-09-16 DIAGNOSIS — Z9049 Acquired absence of other specified parts of digestive tract: Secondary | ICD-10-CM | POA: Diagnosis not present

## 2020-09-16 DIAGNOSIS — C7889 Secondary malignant neoplasm of other digestive organs: Secondary | ICD-10-CM | POA: Insufficient documentation

## 2020-09-16 DIAGNOSIS — C184 Malignant neoplasm of transverse colon: Secondary | ICD-10-CM

## 2020-09-16 DIAGNOSIS — Z803 Family history of malignant neoplasm of breast: Secondary | ICD-10-CM | POA: Diagnosis not present

## 2020-09-16 DIAGNOSIS — Z86010 Personal history of colonic polyps: Secondary | ICD-10-CM | POA: Insufficient documentation

## 2020-09-16 DIAGNOSIS — T451X5A Adverse effect of antineoplastic and immunosuppressive drugs, initial encounter: Secondary | ICD-10-CM | POA: Insufficient documentation

## 2020-09-16 DIAGNOSIS — G620 Drug-induced polyneuropathy: Secondary | ICD-10-CM | POA: Diagnosis not present

## 2020-09-16 NOTE — Progress Notes (Addendum)
[facility]  [address], [address] [place], [place] [address] Phone: [phone]  Fax: [phone]   Clinic Day: 09/16/2020   Referring physician: Tracie Harrier, MD  Chief Complaint: metastaticcolon cancerfollow up  Oncology History:  Jared Gutierrez is a 80 y.o. male  withmetastatic colon cancer. He was diagnosed with stage I colon cancers/p transverse colectomy on 10/17/2013. 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 Tumor extended into the submucosa. Margins were negative. There was lymphovascular invasion. 14 lymph nodes were negative.Pathologic stagewas T1 N0.  Colonoscopyon 10/02/2014 noted several 3 mm polyps and an 8 mm polyp in the cecum and transverse colon. Pathology revealed tubular adenomas negative for high-grade dysplasia and malignancy. Colonoscopyon 02/16/2017 revealed 2 diminutive polyps in the descending colon. Pathology revealed tubular adenomas without dysplasia or malignancy.  CEAhas been followed: 3.1 on 04/27/2014, 2.3 on 11/11/2014, 3.0 on 05/12/2015, 2.7 on 11/10/2015, 3.2 on 05/10/2016, 2.8 on 11/01/2016, 3.3 on 04/25/2017, 3.1 on 10/31/2017, 4.0 on 05/01/2018, 7.0on 11/28/2018, 7.4 on 12/13/2018, 10.5 on 03/25/2019, 17.8on 06/25/2019, 16.2 on 07/09/2019, 13.8 on 07/23/2019, 11.9 on 07/30/2019, 6.1 on 08/27/2019, 6.1 on 09/10/2019, 6.4 on 10/01/2019, 6.9 on 10/15/2019, 7.7 on 10/29/2019, 9.0 on 11/26/2019, 9.2 on 12/10/2019, 10.8 on 12/31/2019, 11.6 on 05/27/2020, and 30.9 on 08/31/2020.  PET scanon 04/02/2019 revealed a2.4 x 2.3 cm (SUV 11)hypermetabolic soft tissue density caudal and anterior to the pancreatic neckfavored to represent isolated peritoneal or nodal metastasis in the setting of prior transverse colonic resection (expected primary drainage). Although this was immediately adjacent to the pancreas, a fat  plane was maintained, arguing strongly against a pancreatic primary. Otherwise,there wasno evidence of hypermetabolic metastasis.  Upper endoscopic ultrasoundon 04/17/2019 revealeda normal esophagus, stomach, duodenum, and pancreas.There was a 2.4 x 2.4 cm irregularmassin the retroperitoneum adjacent to, but not involving the pancreatic neck. FNA and core needle biopsy were performed. Pathologyrevealed adenocarcinoma compatible with a metastatic lesion of colorectal origin. Tumor cells were positive for CK20 and CDX2andnegative for CK7.  Omniseqon 05/15/2019 revealed + KRAS and TP53. Negative results included BRAF V600E, Her2, NRAS, NTRK, PD-L1 (<1%), and TMB 8.7/Mb (intermediate). MMR testingfrom his colon resection on 10/16/2013 was intact with a low probability of MSI-H.  He received 11 cycles ofFOLFOXchemotherapy (07/09/2019- 08/27/2019; 09/17/2019 - 10/15/2019; 11/12/2019 - 12/17/2019) and 1 cycle of 5-FU and leucovorin (12/31/2019).He received Neulasta after cycle #4 and #5 secondary to progressive leukopenia.  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  He received a truncated course of FOLFOX with cycle #11 secondary to oxaliplatin reaction.  PET scan on 01/14/2020 revealed an interval decrease in size and FDG uptake (2.4 x 2.3 cm with SUV 11 to 2.4 x 1.7 cm with SUV 4.09) associated with the previously referenced soft tissue density caudal and anterior to the pancreatic neck suggesting treatment response. There were no new sites of FDG avid tumor.  He received a hybrid 3-dimensional course of 3000 cGy in 10 fractions to the residual FDG avid mass from 02/10/2020 - 02/23/2020.   Abdomen and pelvis CT on 05/27/2020 revealed the index soft tissue lesion just inferior and anterior to the pancreatic neck was stable to minimally increased (2.9 x 1.8 cm compared to 2.4 x 1.7 cm) since 01/14/2020.  There were no new interval findings.    Abdomen and pelvis CT on 08/31/2020  revealed increased size of the index soft tissue lesion inferior and anterior to the pancreatic neck (2.9 x 1.8 cm to  3.5 x 2.9 cm). There were similar prominent retroperitoneal lymph nodes without adenopathy by size criteria. There was no new or enlarging abdominal or pelvic lymph nodes. There were no new interval findings. There was similar circumferential wall thickening of a nondistended urinary bladder, which likely accentuated wall thickening There was hepatic steatosis and aortic atherosclerosis.  He has chronic right lower extremity edema. Right lower extremity duplexon 12/24/2018 revealed no DVT. He has a small right Baker's cyst.Bilateral lower extremityduplexon 08/16/2019 revealed no evidence of deep venous thrombosis in either lower extremity. Increased pulsatility of the venous waveforms bilaterally. These findings suggestedelevated right heart pressures. Differential considerations includedtricuspid regurgitation, right heart failure, pulmonary arterial hypertension and chronic COPD.  Hewas admitted to [place] 08/16/2019 -08/17/2019 with right lower extremitycellulitis.He was unable to bear weight. Hewas treated withIV fluids, NSAIDs, colchicine,and broad antibiotics (vancomycin and Cefepime).He was discharged onindomethacin x 5 daysand Keflex 500 mg TID x 5 days.  He received the Moderna COVID-19 vaccine on 06/07/2019 and 07/05/2019. He received the flu shot on 02/03/2020.  Interval History: Patient returns to clinic for discussion of recent imaging and treatment planning. CT previously showed progression of pancreatic neck lesion. Case was discussed at tumor board and he returns to review recommendations. He discussed with his wife and would consider treatment if offered. He continues to feel well and denies pain, fevers, chills, night sweats, unintentional weight loss. Appetite is stable. No nausea, vomiting, onstipation, or diarrhea. No interval infections.     Past Medical History:  Diagnosis Date  . Arthritis    OSTEOARTHRITIS  . Cancer ([place])   . Cavitary lesion of lung    RIGHT LOWER LOBE  . Chicken pox   . Colon cancer ([place])   . History of kidney stones   . Hypertension   . Lipoma of colon   . Nephrolithiasis   . Nephrolithiasis   . Obesity   . Shingles   . Tubular adenoma of colon    multiple fragments    Past Surgical History:  Procedure Laterality Date  . COLON SURGERY    . COLONOSCOPY N/A 10/02/2014   Procedure: COLONOSCOPY;  Surgeon: Josefine Class, MD;  Location: [facility] ENDOSCOPY;  Service: Endoscopy;  Laterality: N/A;  . COLONOSCOPY WITH PROPOFOL N/A 02/16/2017   Procedure: COLONOSCOPY WITH PROPOFOL;  Surgeon: Manya Silvas, MD;  Location: [facility] ENDOSCOPY;  Service: Endoscopy;  Laterality: N/A;  . EUS N/A 04/17/2019   Procedure: FULL UPPER ENDOSCOPIC ULTRASOUND (EUS) RADIAL;  Surgeon: Holly Bodily, MD;  Location: [facility] ENDOSCOPY;  Service: Gastroenterology;  Laterality: N/A;  . KIDNEY STONE SURGERY    . PARTIAL COLECTOMY  10/17/2013  . PORTACATH PLACEMENT Right 06/13/2019   Procedure: INSERTION PORT-A-CATH;  Surgeon: Benjamine Sprague, DO;  Location: [facility] ORS;  Service: General;  Laterality: Right;    Family History  Problem Relation Age of Onset  . Cancer Mother   . Breast cancer Mother   . COPD Father     Social History:  reports that he has never smoked. He has never used smokeless tobacco. He reports current alcohol use. He reports that he does not use drugs. 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He is married and his wife's name is [name] [phone]).The patient is alone today.  Allergies: No Known Allergies  Current Medications: Current Outpatient Medications  Medication Sig Dispense Refill  . acetaminophen (TYLENOL) 500 MG tablet Take 500 mg by mouth every 6 (six) hours as needed.    [name]  amLODipine (NORVASC) 2.5 MG tablet Take 2.5 mg by mouth daily.    [name] aspirin  EC 81 MG tablet Take 81 mg by mouth daily.    . cephALEXin (KEFLEX) 500 MG capsule Take 500 mg by mouth 3 (three) times daily.     . Cholecalciferol (VITAMIN D) 125 MCG (5000 UT) CAPS Take 5,000 mg by mouth.    . docusate sodium (COLACE) 100 MG capsule Take 100 mg by mouth 2 (two) times daily.    [name] HYDROcodone-acetaminophen (NORCO/VICODIN) 5-325 MG tablet Take 1 tablet by mouth every 6 (six) hours as needed for moderate pain (up to 3 doses for moderate pain.).     [name] loratadine (CLARITIN) 10 MG tablet Take 10 mg by mouth daily.    . NEOMYCIN-POLYMYXIN-HYDROCORTISONE (CORTISPORIN) 1 % SOLN OTIC solution SMARTSIG:Left Ear    . olmesartan (BENICAR) 20 MG tablet Take 20 mg by mouth daily.    . polyethylene glycol-electrolytes (NULYTELY) 420 g solution Take by mouth as directed.    . Probiotic Product (PROBIOTIC DAILY PO) Take 1 tablet by mouth daily.    [name] ibuprofen (ADVIL) 800 MG tablet Take 800 mg by mouth every 8 (eight) hours as needed. (Patient not taking: No sig reported)    . potassium chloride SA (KLOR-CON) 20 MEQ tablet Take by mouth. (Patient not taking: No sig reported)     No current facility-administered medications for this visit.   Facility-Administered Medications Ordered in Other Visits  Medication Dose Route Frequency Provider Last Rate Last Admin  . 0.9 %  sodium chloride infusion   Intravenous Once Corcoran, Melissa C, MD      . 0.9 %  sodium chloride infusion   Intravenous Continuous Rosey Bath, MD 10 mL/hr at 12/31/19 1000 New Bag at 12/31/19 1000  . heparin lock flush 100 unit/mL  500 Units Intravenous Once Rosey Bath, MD       Review of Systems  Constitutional: Negative for chills, diaphoresis, fever, malaise/fatigue and weight loss (up 4 lbs).       Feels "good."  HENT: Negative for congestion, ear discharge, ear pain, hearing loss, nosebleeds, sinus pain, sore throat and tinnitus.   Eyes: Negative for blurred vision.  Respiratory: Negative for cough,  hemoptysis, sputum production and shortness of breath.   Cardiovascular: Negative for chest pain, palpitations and leg swelling.  Gastrointestinal: Negative for abdominal pain, blood in stool, constipation, diarrhea, heartburn, melena, nausea and vomiting.  Genitourinary: Negative for dysuria, frequency, hematuria and urgency.  Musculoskeletal: Positive for joint pain (stiff knees). Negative for back pain, myalgias and neck pain.  Skin: Negative for itching and rash.  Neurological: Positive for sensory change (neuropathy in fingers and toes, stable). Negative for dizziness, tingling, weakness and headaches.  Endo/Heme/Allergies: Does not bruise/bleed easily.  Psychiatric/Behavioral: Negative for depression and memory loss. The patient is not nervous/anxious and does not have insomnia.   All other systems reviewed and are negative.  Performance status (ECOG): 1  Vitals Blood pressure 138/63, pulse 68, temperature (!) 97.5 F (36.4 C), resp. rate 20, weight 265 lb 1.7 oz (120.2 kg), SpO2 98 %.   Physical Exam Vitals and nursing note reviewed.  Constitutional:      General: He is not in acute distress.    Appearance: Normal appearance. He is well-developed. He is not diaphoretic.     Interventions: Face mask in place.     Comments: Cane by his side.  HENT:     Head: Normocephalic and atraumatic.  Comments: Gray hair and beard.    Mouth/Throat:     Mouth: Mucous membranes are moist.     Pharynx: Oropharynx is clear.  Eyes:     General: No scleral icterus.    Extraocular Movements: Extraocular movements intact.     Conjunctiva/sclera: Conjunctivae normal.     Pupils: Pupils are equal, round, and reactive to light.  Cardiovascular:     Rate and Rhythm: Normal rate and regular rhythm.     Heart sounds: Normal heart sounds. No murmur heard.   Pulmonary:     Effort: Pulmonary effort is normal. No respiratory distress.     Breath sounds: Normal breath sounds. No wheezing or rales.   Chest:     Chest wall: No tenderness.  Breasts:     Right: No axillary adenopathy or supraclavicular adenopathy.     Left: No axillary adenopathy or supraclavicular adenopathy.    Abdominal:     General: Bowel sounds are normal. There is no distension.     Palpations: Abdomen is soft. There is no mass.     Tenderness: There is no abdominal tenderness. There is no guarding or rebound.  Musculoskeletal:        General: No swelling or tenderness. Normal range of motion.     Cervical back: Normal range of motion and neck supple.     Right lower leg: No edema.     Left lower leg: No edema.  Lymphadenopathy:     Head:     Right side of head: No preauricular, posterior auricular or occipital adenopathy.     Left side of head: No preauricular, posterior auricular or occipital adenopathy.     Cervical: No cervical adenopathy.     Upper Body:     Right upper body: No supraclavicular or axillary adenopathy.     Left upper body: No supraclavicular or axillary adenopathy.     Lower Body: No right inguinal adenopathy. No left inguinal adenopathy.  Skin:    General: Skin is warm and dry.  Neurological:     Mental Status: He is alert and oriented to person, place, and time.  Psychiatric:        Behavior: Behavior normal.        Thought Content: Thought content normal.        Judgment: Judgment normal.    Imaging studies: 12/20/2018:  Chest, abdomen and pelvis CTrevealed postoperative findings of transverse colon resection without evidence of recurrent mass, definite lymphadenopathy, or distant metastatic disease to explain rising CEA. Therewereprominent sub-centimeter retroperitoneal and iliac lymph nodes, uncertain significance. There were postoperative findings about the left renal hilum, of uncertain nature, with a broad-based postoperative fat containing left-sided lumbar hernia.There was bibasilar bronchiectasis and scarring with multiple post infectious or inflammatory  pneumatoceles, unchanged in comparison to CT date 06/23/2011. 04/02/2019:  PET scanrevealed a2.4 x 2.3 cm (SUV 11)hypermetabolic soft tissue density caudal and anterior to the pancreatic neckfavored to represent isolated peritoneal or nodal metastasis in the setting of prior transverse colonic resection (expected primary drainage). Although this was immediately adjacent to the pancreas, a fat plane was maintained, arguing strongly against a pancreatic primary. Otherwise,there wasno evidence of hypermetabolic metastasis. 04/17/2019:  Upper endoscopic ultrasoundon 04/17/2019 revealeda normal esophagus, stomach, duodenum, and pancreas.There was a 2.4 x 2.4 cm irregularmassin the retroperitoneum adjacent to, but not involving the pancreatic neck.  10/09/2019:  Abdomen and pelvis CT revealed a 2.4 x 2.3 cm lobulated nodule anterior to the pancreatic neck, previously FDG PET avid, not  changed in size although decreased in internal attenuation suggesting treatment response. There was no evidence of new metastatic disease in the abdomen or pelvis.  There were unchanged prominent subcentimeter retroperitoneal and celiac axis lymph nodes, not previously FDG avid and nonspecific. There were postoperative findings about the left renal hilum with a broad-based left-sided lumbar hernia and hepatic steatosis. 12/05/2019:  Chest, abdomen and pelvic CT revealed mild decrease in size of previous FDG avid soft tissue attenuating lesion within the upper abdomen just inferior and anterior to the pancreatic neck (2.2 cm in maximum dimension vs 2.4 cm previously).  There was no new or progressive disease. 01/14/2020:  PET scan revealed an interval decrease in size and FDG uptake (2.4 x 2.3 cm with SUV 11 to 2.4 x 1.7 cm with SUV 4.09) associated with the previously referenced soft tissue density caudal and anterior to the pancreatic neck suggesting treatment response. There were no new sites of FDG avid  tumor. 05/27/2020:  Abdomen and pelvis CT revealed the index soft tissue lesion just inferior and anterior to the pancreatic neck was stable to minimally increased (2.9 x 1.8 cm compared to 2.4 x 1.7 cm) since 01/14/2020.  There were no new interval findings.  There was mild circumferential wall thickening although nondistention of the bladder likely accentuates wall thickness.  There was hepatic steatosis.  There were bilateral renal cysts and aortic atherosclerosis. 08/31/2020:  Abdomen and pelvis CT revealed increased size of the index soft tissue lesion inferior and anterior to the pancreatic neck (2.9 x 1.8 cm to 3.5 x 2.9 cm). There were similar prominent retroperitoneal lymph nodes without adenopathy by size criteria. There was no new or enlarging abdominal or pelvic lymph nodes. There were no new interval findings. There was similar circumferential wall thickening of a nondistended urinary bladder, which likely accentuated wall thickening There was hepatic steatosis and aortic atherosclerosis.   No visits with results within 3 Day(s) from this visit.  Latest known visit with results is:  Infusion on 08/31/2020  Component Date Value Ref Range Status  . WBC 08/31/2020 5.6  4.0 - 10.5 K/uL Final  . RBC 08/31/2020 4.23  4.22 - 5.81 MIL/uL Final  . Hemoglobin 08/31/2020 13.0  13.0 - 17.0 g/dL Final  . HCT 08/31/2020 39.2  39.0 - 52.0 % Final  . MCV 08/31/2020 92.7  80.0 - 100.0 fL Final  . MCH 08/31/2020 30.7  26.0 - 34.0 pg Final  . MCHC 08/31/2020 33.2  30.0 - 36.0 g/dL Final  . RDW 08/31/2020 12.6  11.5 - 15.5 % Final  . Platelets 08/31/2020 211  150 - 400 K/uL Final  . nRBC 08/31/2020 0.0  0.0 - 0.2 % Final  . Neutrophils Relative % 08/31/2020 64  % Final  . Neutro Abs 08/31/2020 3.7  1.7 - 7.7 K/uL Final  . Lymphocytes Relative 08/31/2020 24  % Final  . Lymphs Abs 08/31/2020 1.4  0.7 - 4.0 K/uL Final  . Monocytes Relative 08/31/2020 8  % Final  . Monocytes Absolute 08/31/2020 0.4  0.1  - 1.0 K/uL Final  . Eosinophils Relative 08/31/2020 2  % Final  . Eosinophils Absolute 08/31/2020 0.1  0.0 - 0.5 K/uL Final  . Basophils Relative 08/31/2020 1  % Final  . Basophils Absolute 08/31/2020 0.0  0.0 - 0.1 K/uL Final  . Immature Granulocytes 08/31/2020 1  % Final  . Abs Immature Granulocytes 08/31/2020 0.03  0.00 - 0.07 K/uL Final   Performed at [facility],  [address] [place], [place] [address]  . Sodium 08/31/2020 135  135 - 145 mmol/L Final  . Potassium 08/31/2020 3.4* 3.5 - 5.1 mmol/L Final  . Chloride 08/31/2020 101  98 - 111 mmol/L Final  . CO2 08/31/2020 24  22 - 32 mmol/L Final  . Glucose, Bld 08/31/2020 105* 70 - 99 mg/dL Final   Glucose reference range applies only to samples taken after fasting for at least 8 hours.  . BUN 08/31/2020 10  8 - 23 mg/dL Final  . Creatinine, Ser 08/31/2020 0.78  0.61 - 1.24 mg/dL Final  . Calcium 08/31/2020 9.1  8.9 - 10.3 mg/dL Final  . Total Protein 08/31/2020 7.0  6.5 - 8.1 g/dL Final  . Albumin 08/31/2020 3.9  3.5 - 5.0 g/dL Final  . AST 08/31/2020 42* 15 - 41 U/L Final  . ALT 08/31/2020 36  0 - 44 U/L Final  . Alkaline Phosphatase 08/31/2020 57  38 - 126 U/L Final  . Total Bilirubin 08/31/2020 0.9  0.3 - 1.2 mg/dL Final  . GFR, Estimated 08/31/2020 >60  >60 mL/min Final   Comment: (NOTE) Calculated using the CKD-EPI Creatinine Equation (2021)   . Anion gap 08/31/2020 10  5 - 15 Final   Performed at [facility], [address] [place], [place] [address]  . CEA 08/31/2020 30.9* 0.0 - 4.7 ng/mL Final   Comment: (NOTE)                             Nonsmokers          <3.9                             Smokers             <5.6 [employer] Electrochemiluminescence Immunoassay (ECLIA) Values obtained with different assay methods or kits cannot be used interchangeably.  Results cannot be interpreted as absolute evidence of the presence or absence of malignant disease. Performed At: [facility] [place], [place] [id number] Rush Farmer MD PY:[id number]     Assessment:   1. Metastatic Colon Cancer - Initially presented with stage I disease s/p resection. PET 04/02/2019 revealed soft tissue density at pancreatic neck. Biopsy consistent with adenocarcinoma of colorectal origin. Not resectable. NGS revealed no targetable mutation. MMR on original resection revealed low probability of MSI-H. Received 11 cycles of FOLFOX chemotherapy and 1 cycle of 5-FU and leucovorin secondary to oxaliplatin reaction; completed 12/31/19. Declined avastin. PET 01/14/20 revealed isolated lesion at pancreatic neck, underwent SBRT completed 02/23/20. CT from 08/31/20 revealed that pancreatic neck lesion has increased to 3.5 cm with prominent retroperitoneal lymph nodes consistent with progression of disease. Case discussed at tumor board. Recommendations reviewed with patient including treatment vs alternatives and patient would like to consider treatment but wishes to delay imaging for a month. He has colonoscopy scheduled for 10/05/20.   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 Declined gabapentin previously. Monitor.   3. Goals of care- patient not currently followed by palliative care. Consider referral for goals of care discussion at next visit given palliative intent of any treatment.   Plan:  PET scan in 1 month Follow up with MD day or two after for results and treatment planning.   I discussed the assessment and treatment plan with the patient.  The patient was provided an opportunity to ask  questions and all were answered.  The patient agreed with the plan and demonstrated an understanding of the instructions.  The patient was advised to call back if the symptoms worsen or if the condition fails to improve as anticipated.  Beckey Rutter, DNP, AGNP-C [place] at [facility]

## 2020-09-30 ENCOUNTER — Other Ambulatory Visit: Payer: Self-pay

## 2020-09-30 ENCOUNTER — Inpatient Hospital Stay: Payer: Medicare Other

## 2020-09-30 DIAGNOSIS — C184 Malignant neoplasm of transverse colon: Secondary | ICD-10-CM | POA: Diagnosis not present

## 2020-09-30 DIAGNOSIS — Z95828 Presence of other vascular implants and grafts: Secondary | ICD-10-CM

## 2020-09-30 MED ORDER — SODIUM CHLORIDE 0.9% FLUSH
10.0000 mL | Freq: Once | INTRAVENOUS | Status: AC
  Administered 2020-09-30: 10 mL via INTRAVENOUS
  Filled 2020-09-30: qty 10

## 2020-09-30 MED ORDER — SODIUM CHLORIDE 0.9 % IV SOLN
Freq: Once | INTRAVENOUS | Status: DC
  Filled 2020-09-30: qty 250

## 2020-09-30 MED ORDER — HEPARIN SOD (PORK) LOCK FLUSH 100 UNIT/ML IV SOLN
500.0000 [IU] | Freq: Once | INTRAVENOUS | Status: AC
  Administered 2020-09-30: 500 [IU] via INTRAVENOUS
  Filled 2020-09-30: qty 5

## 2020-10-04 ENCOUNTER — Encounter: Payer: Self-pay | Admitting: *Deleted

## 2020-10-05 ENCOUNTER — Ambulatory Visit: Payer: Medicare Other | Admitting: Certified Registered"

## 2020-10-05 ENCOUNTER — Ambulatory Visit
Admission: RE | Admit: 2020-10-05 | Discharge: 2020-10-05 | Disposition: A | Discharge status: Home / Self Care | Payer: Medicare Other | Attending: Gastroenterology | Admitting: Gastroenterology

## 2020-10-05 ENCOUNTER — Encounter
Admission: RE | Disposition: A | Discharge status: Home / Self Care | Payer: Self-pay | Source: Home / Self Care | Attending: Gastroenterology

## 2020-10-05 ENCOUNTER — Encounter: Payer: Self-pay | Admitting: *Deleted

## 2020-10-05 DIAGNOSIS — Z9221 Personal history of antineoplastic chemotherapy: Secondary | ICD-10-CM | POA: Insufficient documentation

## 2020-10-05 DIAGNOSIS — Z79899 Other long term (current) drug therapy: Secondary | ICD-10-CM | POA: Diagnosis not present

## 2020-10-05 DIAGNOSIS — Z85038 Personal history of other malignant neoplasm of large intestine: Secondary | ICD-10-CM | POA: Diagnosis present

## 2020-10-05 DIAGNOSIS — Z7982 Long term (current) use of aspirin: Secondary | ICD-10-CM | POA: Diagnosis not present

## 2020-10-05 DIAGNOSIS — K640 First degree hemorrhoids: Secondary | ICD-10-CM | POA: Insufficient documentation

## 2020-10-05 DIAGNOSIS — Z1211 Encounter for screening for malignant neoplasm of colon: Secondary | ICD-10-CM | POA: Insufficient documentation

## 2020-10-05 DIAGNOSIS — Z923 Personal history of irradiation: Secondary | ICD-10-CM | POA: Diagnosis not present

## 2020-10-05 HISTORY — PX: COLONOSCOPY WITH PROPOFOL: SHX5780

## 2020-10-05 SURGERY — COLONOSCOPY WITH PROPOFOL
Anesthesia: General

## 2020-10-05 MED ORDER — SODIUM CHLORIDE 0.9 % IV SOLN
INTRAVENOUS | Status: DC
  Administered 2020-10-05: 1000 mL via INTRAVENOUS

## 2020-10-05 MED ORDER — LIDOCAINE HCL (CARDIAC) PF 100 MG/5ML IV SOSY
PREFILLED_SYRINGE | INTRAVENOUS | Status: DC | PRN
  Administered 2020-10-05: 80 mg via INTRAVENOUS

## 2020-10-05 MED ORDER — PROPOFOL 10 MG/ML IV BOLUS
INTRAVENOUS | Status: AC
  Filled 2020-10-05: qty 20

## 2020-10-05 MED ORDER — PROPOFOL 10 MG/ML IV BOLUS
INTRAVENOUS | Status: AC
  Filled 2020-10-05: qty 40

## 2020-10-05 MED ORDER — PROPOFOL 10 MG/ML IV BOLUS
INTRAVENOUS | Status: DC | PRN
  Administered 2020-10-05: 60 mg via INTRAVENOUS

## 2020-10-05 MED ORDER — PROPOFOL 500 MG/50ML IV EMUL
INTRAVENOUS | Status: DC | PRN
  Administered 2020-10-05: 150 ug/kg/min via INTRAVENOUS

## 2020-10-05 NOTE — Anesthesia Preprocedure Evaluation (Signed)
Anesthesia Evaluation  Patient identified by MRN, date of birth, ID band Patient awake    Reviewed: Allergy & Precautions, NPO status , Patient's Chart, lab work & pertinent test results  History of Anesthesia Complications Negative for: history of anesthetic complications  Airway Mallampati: III  TM Distance: >3 FB Neck ROM: Full    Dental  (+) Missing, Poor Dentition, Chipped, Loose,    Pulmonary neg sleep apnea, neg COPD, Not current smoker,    Pulmonary exam normal        Cardiovascular hypertension, Pt. on medications (-) Past MI and (-) CHF Normal cardiovascular exam(-) dysrhythmias (-) Valvular Problems/Murmurs     Neuro/Psych neg Seizures  Neuromuscular disease    GI/Hepatic Neg liver ROS, Bowel prep,neg GERD  ,  Endo/Other  neg diabetesMorbid obesity  Renal/GU Renal disease (stones)     Musculoskeletal  (+) Arthritis , Osteoarthritis,    Abdominal   Peds  Hematology   Anesthesia Other Findings Arthritis  OSTEOARTHRITIS  Cancer ([place])    Cavitary lesion of lung  RIGHT LOWER LOBE  Chicken pox    Colon cancer ([facility])    History of kidney stones   Hypertension    Lipoma of colon    Nephrolithiasis    Nephrolithiasis    Obesity    Shingles    Tubular adenoma of colon       Reproductive/Obstetrics                             Anesthesia Physical  Anesthesia Plan  ASA: III  Anesthesia Plan: General   Post-op Pain Management:    Induction: Intravenous  PONV Risk Score and Plan: 2 and Propofol infusion and TIVA  Airway Management Planned: Nasal Cannula and Natural Airway  Additional Equipment:   Intra-op Plan:   Post-operative Plan:   Informed Consent: I have reviewed the patients History and Physical, chart, labs and discussed the procedure including the risks, benefits and alternatives for the proposed anesthesia with the patient or authorized representative who has  indicated his/her understanding and acceptance.       Plan Discussed with: CRNA, Anesthesiologist and Surgeon  Anesthesia Plan Comments:         Anesthesia Quick Evaluation

## 2020-10-05 NOTE — Anesthesia Procedure Notes (Signed)
Procedure Name: MAC Date/Time: 10/05/2020 10:46 AM Performed by: Jerrye Noble, CRNA Pre-anesthesia Checklist: Patient identified, Emergency Drugs available, Suction available and Patient being monitored Patient Re-evaluated:Patient Re-evaluated prior to induction Oxygen Delivery Method: Nasal cannula

## 2020-10-05 NOTE — Interval H&P Note (Signed)
History and Physical Interval Note:  10/05/2020 10:42 AM  Jared Gutierrez  has presented today for surgery, with the diagnosis of COLON CARCINOMA.  The various methods of treatment have been discussed with the patient and family. After consideration of risks, benefits and other options for treatment, the patient has consented to  Procedure(s): COLONOSCOPY WITH PROPOFOL (N/A) as a surgical intervention.  The patient's history has been reviewed, patient examined, no change in status, stable for surgery.  I have reviewed the patient's chart and labs.  Questions were answered to the patient's satisfaction.     Lesly Rubenstein  Ok to proceed with colonoscopy

## 2020-10-05 NOTE — Transfer of Care (Signed)
Immediate Anesthesia Transfer of Care Note  Patient: Jared Gutierrez  Procedure(s) Performed: COLONOSCOPY WITH PROPOFOL (N/A )  Patient Location: Endoscopy Unit  Anesthesia Type:General  Level of Consciousness: drowsy  Airway & Oxygen Therapy: Patient Spontanous Breathing  Post-op Assessment: Report given to RN and Post -op Vital signs reviewed and stable  Post vital signs: Reviewed and stable  Last Vitals:  Vitals Value Taken Time  BP 90/57 10/05/20 1112  Temp    Pulse 71 10/05/20 1113  Resp 25 10/05/20 1113  SpO2 96 % 10/05/20 1113  Vitals shown include unvalidated device data.  Last Pain:  Vitals:   10/05/20 1020  PainSc: 0-No pain         Complications: No complications documented.

## 2020-10-05 NOTE — Op Note (Signed)
[facility]astroenter[facility] Patient Name: Jared Gutierrez Procedure Date: 10/05/2020 10:37 AM MRN: [id number] Account #: [id number] Date of Birth: 07-02-1940 Admit Type: Outpatient Age: 80 Room: [facility] ENDO ROOM 1 Gender: Male Note Status: Finalized Procedure:             Colonoscopy Indications:           High risk colon cancer surveillance: Personal history                         of colon cancer Providers:             Andrey Farmer MD, MD Referring MD:          Tracie Harrier, MD (Referring MD) Medicines:             Monitored Anesthesia Care Complications:         No immediate complications. Procedure:             Pre-Anesthesia Assessment:                        - Prior to the procedure, a History and Physical was                         performed, and patient medications and allergies were                         reviewed. The patient is competent. The risks and                         benefits of the procedure and the sedation options and                         risks were discussed with the patient. All questions                         were answered and informed consent was obtained.                         Patient identification and proposed procedure were                         verified by the physician, the nurse, the anesthetist                         and the technician in the endoscopy suite. Mental                         Status Examination: alert and oriented. Airway                         Examination: normal oropharyngeal airway and neck                         mobility. Respiratory Examination: clear to                         auscultation. CV Examination: normal. Prophylactic  Antibiotics: The patient does not require prophylactic                         antibiotics. Prior Anticoagulants: The patient has                         taken no previous anticoagulant or antiplatelet                         agents. ASA Grade  Assessment: III - A patient with                         severe systemic disease. After reviewing the risks and                         benefits, the patient was deemed in satisfactory                         condition to undergo the procedure. The anesthesia                         plan was to use monitored anesthesia care (MAC).                         Immediately prior to administration of medications,                         the patient was re-assessed for adequacy to receive                         sedatives. The heart rate, respiratory rate, oxygen                         saturations, blood pressure, adequacy of pulmonary                         ventilation, and response to care were monitored                         throughout the procedure. The physical status of the                         patient was re-assessed after the procedure.                        After obtaining informed consent, the colonoscope was                         passed under direct vision. Throughout the procedure,                         the patient's blood pressure, pulse, and oxygen                         saturations were monitored continuously. The                         Colonoscope was introduced through the anus and  advanced to the the cecum, identified by appendiceal                         orifice and ileocecal valve. The colonoscopy was                         performed without difficulty. The patient tolerated                         the procedure well. The quality of the bowel                         preparation was good. Findings:      The perianal and digital rectal examinations were normal.      Internal hemorrhoids were found during retroflexion. The hemorrhoids       were Grade I (internal hemorrhoids that do not prolapse).      The exam was otherwise without abnormality on direct and retroflexion       views. Impression:            - Internal hemorrhoids.                         - The examination was otherwise normal on direct and                         retroflexion views.                        - No specimens collected. Recommendation:        - Discharge patient to home.                        - Resume previous diet.                        - Continue present medications.                        - Repeat colonoscopy is not recommended due to current                         age (65 years or older) for surveillance.                        - Return to referring physician as previously                         scheduled. Procedure Code(s):     --- Professional ---                        U8828, Colorectal cancer screening; colonoscopy on                         individual at high risk Diagnosis Code(s):     --- Professional ---                        M03.491, Personal history of other malignant neoplasm  of large intestine                        K64.0, First degree hemorrhoids CPT copyright 2019 [employer]. All rights reserved. The codes documented in this report are preliminary and upon coder review may  be revised to meet current compliance requirements. Andrey Farmer MD, MD 10/05/2020 11:09:26 AM Number of Addenda: 0 Note Initiated On: 10/05/2020 10:37 AM Scope Withdrawal Time: 0 hours 7 minutes 15 seconds  Total Procedure Duration: 0 hours 14 minutes 17 seconds  Estimated Blood Loss:  Estimated blood loss: none.      [facility]

## 2020-10-05 NOTE — Anesthesia Postprocedure Evaluation (Signed)
Anesthesia Post Note  Patient: Jared Gutierrez  Procedure(s) Performed: COLONOSCOPY WITH PROPOFOL (N/A )  Patient location during evaluation: Phase II Anesthesia Type: General Level of consciousness: awake and alert, awake and oriented Pain management: pain level controlled Vital Signs Assessment: post-procedure vital signs reviewed and stable Respiratory status: spontaneous breathing, nonlabored ventilation and respiratory function stable Cardiovascular status: blood pressure returned to baseline and stable Postop Assessment: no apparent nausea or vomiting Anesthetic complications: no   No complications documented.   Last Vitals:  Vitals:   10/05/20 1118 10/05/20 1140  BP: 118/65 124/72  Pulse: 70 (!) 58  Resp: 16 18  Temp:    SpO2: 100% 99%    Last Pain:  Vitals:   10/05/20 1140  TempSrc:   PainSc: 0-No pain                 Phill Mutter

## 2020-10-05 NOTE — H&P (Signed)
Outpatient short stay form Pre-procedure 10/05/2020 10:40 AM Raylene Miyamoto MD, MPH  Primary Physician: Dr. Ginette Pitman  Reason for visit:  Surveillanec  History of present illness:   80 y/o gentleman with history of metastatic colorectal cancer (pancreas) here for surveillance colonoscopy. Had colonoscopy in 2018 with small polyps. Subsequently found to have lesion in pancreas from colon origin and is s/p chemo/radiation for that. Had partial colectomy for original cancer. No blood thinners. No significant symptoms.    Current Facility-Administered Medications:  .  0.9 %  sodium chloride infusion, , Intravenous, Continuous, Viera Okonski, Hilton Cork, MD  Facility-Administered Medications Ordered in Other Encounters:  .  0.9 %  sodium chloride infusion, , Intravenous, Once, Corcoran, Melissa C, MD .  0.9 %  sodium chloride infusion, , Intravenous, Continuous, Corcoran, Melissa C, MD, Last Rate: 10 mL/hr at 10/05/20 1017, Continued from Pre-op at 10/05/20 1017 .  heparin lock flush 100 unit/mL, 500 Units, Intravenous, Once, Corcoran, Melissa C, MD  Medications Prior to Admission  Medication Sig Dispense Refill Last Dose  . acetaminophen (TYLENOL) 500 MG tablet Take 500 mg by mouth every 6 (six) hours as needed.   Past Week at Unknown time  . amLODipine (NORVASC) 2.5 MG tablet Take 2.5 mg by mouth daily.   10/04/2020 at Unknown time  . aspirin EC 81 MG tablet Take 81 mg by mouth daily.   Past Week at Unknown time  . Cholecalciferol (VITAMIN D) 125 MCG (5000 UT) CAPS Take 5,000 mg by mouth.   Past Week at Unknown time  . docusate sodium (COLACE) 100 MG capsule Take 100 mg by mouth 2 (two) times daily.   Past Week at Unknown time  . NEOMYCIN-POLYMYXIN-HYDROCORTISONE (CORTISPORIN) 1 % SOLN OTIC solution SMARTSIG:Left Ear   Past Week at Unknown time  . polyethylene glycol-electrolytes (NULYTELY) 420 g solution Take by mouth as directed.   10/04/2020 at Unknown time  . Probiotic Product (PROBIOTIC DAILY PO)  Take 1 tablet by mouth daily.   Past Week at Unknown time  . cephALEXin (KEFLEX) 500 MG capsule Take 500 mg by mouth 3 (three) times daily.  (Patient not taking: Reported on 10/05/2020)   Completed Course at Unknown time  . HYDROcodone-acetaminophen (NORCO/VICODIN) 5-325 MG tablet Take 1 tablet by mouth every 6 (six) hours as needed for moderate pain (up to 3 doses for moderate pain.).  (Patient not taking: Reported on 10/05/2020)   Completed Course at Unknown time  . ibuprofen (ADVIL) 800 MG tablet Take 800 mg by mouth every 8 (eight) hours as needed. (Patient not taking: No sig reported)     . loratadine (CLARITIN) 10 MG tablet Take 10 mg by mouth daily. (Patient not taking: Reported on 10/05/2020)   Not Taking at Unknown time  . olmesartan (BENICAR) 20 MG tablet Take 20 mg by mouth daily.     . potassium chloride SA (KLOR-CON) 20 MEQ tablet Take by mouth. (Patient not taking: No sig reported)        No Known Allergies   Past Medical History:  Diagnosis Date  . Arthritis    OSTEOARTHRITIS  . Cancer ([place])   . Cavitary lesion of lung    RIGHT LOWER LOBE  . Chicken pox   . Colon cancer ([place])   . History of kidney stones   . Hypertension   . Lipoma of colon   . Nephrolithiasis   . Nephrolithiasis   . Obesity   . Shingles   . Tubular adenoma of colon  multiple fragments    Review of systems:  Otherwise negative.    Physical Exam  Gen: Alert, oriented. Appears stated age.  HEENT: PERRLA. Lungs: No respiratory distress CV: RRR Abd: soft, benign, no masses Ext: No edema    Planned procedures: Proceed with colonoscopy. The patient understands the nature of the planned procedure, indications, risks, alternatives and potential complications including but not limited to bleeding, infection, perforation, damage to internal organs and possible oversedation/side effects from anesthesia. The patient agrees and gives consent to proceed.  Please refer to procedure notes for findings,  recommendations and patient disposition/instructions.     Raylene Miyamoto MD, MPH Gastroenterology 10/05/2020  10:40 AM

## 2020-10-06 ENCOUNTER — Encounter: Payer: Self-pay | Admitting: Gastroenterology

## 2020-10-07 ENCOUNTER — Ambulatory Visit: Payer: Medicare Other

## 2020-10-11 ENCOUNTER — Encounter: Payer: Self-pay | Admitting: Hematology and Oncology

## 2020-10-14 ENCOUNTER — Ambulatory Visit: Payer: Medicare Other | Admitting: Oncology

## 2020-11-02 ENCOUNTER — Ambulatory Visit
Admission: RE | Admit: 2020-11-02 | Discharge: 2020-11-02 | Disposition: A | Discharge status: Home / Self Care | Payer: Medicare Other | Source: Ambulatory Visit | Attending: Nurse Practitioner | Admitting: Nurse Practitioner

## 2020-11-02 ENCOUNTER — Other Ambulatory Visit: Payer: Self-pay

## 2020-11-02 DIAGNOSIS — C184 Malignant neoplasm of transverse colon: Secondary | ICD-10-CM

## 2020-11-03 ENCOUNTER — Ambulatory Visit
Admission: RE | Admit: 2020-11-03 | Discharge: 2020-11-03 | Disposition: A | Discharge status: Home / Self Care | Payer: Medicare Other | Source: Ambulatory Visit | Attending: Nurse Practitioner | Admitting: Nurse Practitioner

## 2020-11-03 DIAGNOSIS — C184 Malignant neoplasm of transverse colon: Secondary | ICD-10-CM | POA: Insufficient documentation

## 2020-11-03 DIAGNOSIS — C786 Secondary malignant neoplasm of retroperitoneum and peritoneum: Secondary | ICD-10-CM | POA: Diagnosis not present

## 2020-11-03 LAB — GLUCOSE, CAPILLARY: Glucose-Capillary: 96 mg/dL (ref 70–99)

## 2020-11-03 MED ORDER — FLUDEOXYGLUCOSE F - 18 (FDG) INJECTION
13.9000 | Freq: Once | INTRAVENOUS | Status: AC | PRN
  Administered 2020-11-03: 13.9 via INTRAVENOUS

## 2020-11-04 ENCOUNTER — Inpatient Hospital Stay: Payer: Medicare Other | Admitting: Oncology

## 2020-11-11 ENCOUNTER — Encounter: Payer: Self-pay | Admitting: Oncology

## 2020-11-11 ENCOUNTER — Other Ambulatory Visit: Payer: Self-pay

## 2020-11-11 ENCOUNTER — Inpatient Hospital Stay: Payer: Medicare Other

## 2020-11-11 ENCOUNTER — Inpatient Hospital Stay: Payer: Medicare Other | Attending: Oncology | Admitting: Oncology

## 2020-11-11 VITALS — BP 138/69 | HR 62 | Temp 97.3°F | Resp 20 | Wt 270.4 lb

## 2020-11-11 DIAGNOSIS — K648 Other hemorrhoids: Secondary | ICD-10-CM | POA: Insufficient documentation

## 2020-11-11 DIAGNOSIS — Z7189 Other specified counseling: Secondary | ICD-10-CM | POA: Diagnosis not present

## 2020-11-11 DIAGNOSIS — Z86010 Personal history of colonic polyps: Secondary | ICD-10-CM | POA: Insufficient documentation

## 2020-11-11 DIAGNOSIS — D72819 Decreased white blood cell count, unspecified: Secondary | ICD-10-CM | POA: Insufficient documentation

## 2020-11-11 DIAGNOSIS — Z836 Family history of other diseases of the respiratory system: Secondary | ICD-10-CM | POA: Diagnosis not present

## 2020-11-11 DIAGNOSIS — C184 Malignant neoplasm of transverse colon: Secondary | ICD-10-CM | POA: Diagnosis not present

## 2020-11-11 DIAGNOSIS — Z79899 Other long term (current) drug therapy: Secondary | ICD-10-CM | POA: Insufficient documentation

## 2020-11-11 DIAGNOSIS — Z809 Family history of malignant neoplasm, unspecified: Secondary | ICD-10-CM | POA: Insufficient documentation

## 2020-11-11 DIAGNOSIS — M109 Gout, unspecified: Secondary | ICD-10-CM | POA: Diagnosis not present

## 2020-11-11 DIAGNOSIS — G629 Polyneuropathy, unspecified: Secondary | ICD-10-CM | POA: Diagnosis not present

## 2020-11-11 DIAGNOSIS — C786 Secondary malignant neoplasm of retroperitoneum and peritoneum: Secondary | ICD-10-CM | POA: Insufficient documentation

## 2020-11-11 DIAGNOSIS — Z95828 Presence of other vascular implants and grafts: Secondary | ICD-10-CM

## 2020-11-11 DIAGNOSIS — M7989 Other specified soft tissue disorders: Secondary | ICD-10-CM | POA: Diagnosis not present

## 2020-11-11 DIAGNOSIS — T451X5A Adverse effect of antineoplastic and immunosuppressive drugs, initial encounter: Secondary | ICD-10-CM | POA: Insufficient documentation

## 2020-11-11 DIAGNOSIS — Z803 Family history of malignant neoplasm of breast: Secondary | ICD-10-CM | POA: Diagnosis not present

## 2020-11-11 DIAGNOSIS — R0602 Shortness of breath: Secondary | ICD-10-CM | POA: Diagnosis not present

## 2020-11-11 MED ORDER — HEPARIN SOD (PORK) LOCK FLUSH 100 UNIT/ML IV SOLN
500.0000 [IU] | Freq: Once | INTRAVENOUS | Status: AC
  Administered 2020-11-11: 500 [IU] via INTRAVENOUS
  Filled 2020-11-11: qty 5

## 2020-11-11 NOTE — Progress Notes (Signed)
[facility]  [address], [address] [place], [place] [address] Phone: [phone]  Fax: [phone]   Clinic Day: 11/11/2020   Referring physician: Tracie Harrier, MD  Chief Complaint: Jared Gutierrez is a 80 y.o. male presents for follow-up of metastatic colon cancer   PERTINENT ONCOLOGY HISTORY Jared Gutierrez is a 80 y.o.[place] who has above oncology history reviewed by me today presented for follow up visit for management of  Metastatic colon cancer. [name], patient switched care to me on 11/11/20 Extensive medical record review was performed by me  # Metastatic colon cancer.  he was diagnosed with stage I colon cancer s/p transverse colectomy on 10/17/2013.  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  Tumor extended into the submucosa. Margins were negative. + lymphovascular invasion. 14 lymph nodes were negative. Pathologic stage was T1 N0.   10/02/2014 Colonoscopy showed several 3 mm polyps and an 8 mm polyp in the cecum and transverse colon.  Pathology revealed tubular adenomas negative for high-grade dysplasia and malignancy.  02/16/2017 Colonoscopy  revealed 2 diminutive polyps in the descending colon.  Pathology revealed tubular adenomas without dysplasia or malignancy.  Her CEA has been monitored and was noted to increase starting July 2020. 04/02/2019  PET scan  limited evealed a 2.4 x 2.3 cm (SUV 11) hypermetabolic soft tissue density caudal and anterior to the pancreatic neck favored to represent isolated peritoneal or nodal metastasis in the setting of prior transverse colonic resection (expected primary drainage). Although this was immediately adjacent to the pancreas, a fat plane was maintained, arguing strongly against a pancreatic primary. Otherwise, there was no evidence of hypermetabolic metastasis.    04/17/2019 EUS on  revealed a normal esophagus, stomach, duodenum, and pancreas.  There was a 2.4 x 2.4 cm irregular mass in the retroperitoneum adjacent to, but not involving the pancreatic neck.  FNA and core needle biopsy were performed.  Pathology revealed adenocarcinoma compatible with a metastatic lesion of colorectal origin.  Tumor cells were positive for CK20 and CDX2 and negative for CK7.   NGS: [employer] on 05/15/2019 revealed + KRASG13D and TP53.  Negative results included BRAF V600E, Her2, NRAS, NTRK, PD-L1 (<1%), and TMB 8.7/Mb (intermediate).  MMR testing from his colon resection on 10/16/2013 was intact with a low probability of MSI-H.  06/25/2019, CEA 17.8. 07/09/2019 - 08/27/2019; 09/17/2019 - 10/15/2019; 11/12/2019 - 12/17/2019 He received 11 cycles of FOLFOX chemotherapy and 1 cycle of 5-FU and leucovorin (12/31/2019).  He received Neulasta after cycle #4 and #5 secondary to progressive leukopenia.  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  He received a truncated course of FOLFOX with cycle #11 secondary to oxaliplatin reaction.  08/27/2019, CEA 6.1 10/29/2019, CEA 7.7 12/31/2019, CEA 10.8.  01/14/2020 PET revealed an interval decrease in size and FDG uptake (2.4 x 2.3 cm with SUV 11 to 2.4 x 1.7 cm with SUV 4.09) associated with the previously referenced soft tissue density caudal and anterior to the pancreatic neck suggesting treatment response. There were no new sites of FDG avid tumor.  Radiation treatment 02/10/2020 - 02/23/2020.   He received a hybrid 3-dimensional course of 3000 cGy in 10 fractions to the residual FDG avid mass   05/27/2020, CEA 11.6 05/27/2020 Abdomen and pelvis CT :stable to minimally increased (2.9 x 1.8 cm compared to 2.4 x 1.7 cm) since 01/14/2020.  There were no new interval findings.  08/31/2020, CEA 30.9. 08/31/2020 Abdomen and pelvis CT revealed increased size of the index soft tissue lesion inferior and anterior to the pancreatic neck  (2.9 x 1.8 cm to 3.5 x 2.9 cm). There were similar prominent retroperitoneal lymph nodes without adenopathy by size criteria. There was no new or enlarging abdominal or pelvic lymph nodes. There were no new interval findings. There was similar circumferential wall thickening of a nondistended urinary bladder, which likely accentuated wall thickening There was hepatic steatosis and aortic atherosclerosis.  11/03/2020, PET showed recurrent peritoneal metastasis in the upper abdomen adjacent to the pancreas.  The lesion is 3.8 x 2.9 cm with SUV of 11.3. No evidence of metastatic peritoneal disease elsewhere in the abdomen pelvis.  No evidence of distant metastasis.  Other medical problems Chronic lower extremity edema. 12/24/2018, right lower extremity duplex negative for DVT.  Small right Baker's cyst. 08/16/2019, bilateral lower extremity duplex showed no DVT.  08/16/2019 - 08/17/2019 with right lower extremity cellulitis.  He was unable to bear weight.  He was treated with IV fluids, NSAIDs, colchicine, and broad antibiotics (vancomycin and Cefepime).  He was discharged on indomethacin x 5 days and Keflex 500 mg TID x 5 days.  10/05/2020, colonoscopy showed internal hemorrhoids.  Otherwise normal examination.  Today patient presents to discuss PET scan results and management plan. he was accompanied by daughter. Patient denies pain.  He has bilateral lower extremity edema.  Appetite is fair, his weight is stable at 270.  Similar to his weight on 09/02/2020.  Review of Systems  Constitutional:  Negative for appetite change, chills, diaphoresis, fever and unexpected weight change.  HENT:   Negative for hearing loss, nosebleeds, sore throat and tinnitus.   Respiratory:  Positive for shortness of breath. Negative for cough and hemoptysis.   Cardiovascular:  Positive for leg swelling. Negative for chest pain and palpitations.  Gastrointestinal:  Negative for abdominal pain, blood in stool, constipation,  diarrhea, nausea and vomiting.  Genitourinary:  Negative for dysuria, frequency and hematuria.   Musculoskeletal:  Negative for back pain, myalgias and neck pain.  Skin:  Negative for itching and rash.  Neurological:  Negative for dizziness and headaches.  Hematological:  Does not bruise/bleed easily.  Psychiatric/Behavioral:  Negative for depression. The patient is not nervous/anxious.   All other systems reviewed and are negative.    Past Medical History:  Diagnosis Date   Arthritis    OSTEOARTHRITIS   Cancer ([place])    Cavitary lesion of lung    RIGHT LOWER LOBE   Chicken pox    Colon cancer (HCC)    History of kidney stones    Hypertension    Lipoma of colon    Nephrolithiasis    Nephrolithiasis    Obesity    Shingles    Tubular adenoma of colon    multiple fragments    Past Surgical History:  Procedure Laterality Date   COLON SURGERY     COLONOSCOPY N/A 10/02/2014   Procedure: COLONOSCOPY;  Surgeon: Josefine Class, MD;  Location: [facility] ENDOSCOPY;  Service: Endoscopy;  Laterality: N/A;   COLONOSCOPY WITH PROPOFOL N/A 02/16/2017   Procedure: COLONOSCOPY WITH PROPOFOL;  Surgeon: Manya Silvas, MD;  Location: [facility] ENDOSCOPY;  Service: Endoscopy;  Laterality: N/A;   COLONOSCOPY WITH PROPOFOL N/A 10/05/2020   Procedure: COLONOSCOPY WITH PROPOFOL;  Surgeon: Lesly Rubenstein, MD;  Location: [facility] ENDOSCOPY;  Service: Endoscopy;  Laterality: N/A;   EUS N/A 04/17/2019   Procedure: FULL UPPER ENDOSCOPIC ULTRASOUND (EUS)  RADIAL;  Surgeon: Holly Bodily, MD;  Location: [facility] ENDOSCOPY;  Service: Gastroenterology;  Laterality: N/A;   KIDNEY STONE SURGERY     PARTIAL COLECTOMY  10/17/2013   PORTACATH PLACEMENT Right 06/13/2019   Procedure: INSERTION PORT-A-CATH;  Surgeon: Benjamine Sprague, DO;  Location: [facility] ORS;  Service: General;  Laterality: Right;    Family History  Problem Relation Age of Onset   Cancer Mother    Breast cancer Mother    COPD Father     Social  History:  reports that he has never smoked. He has never used smokeless tobacco. He reports current alcohol use. He reports that he does not use drugs. 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 He is married and his wife's name is [name] [phone]).  The patient is alone today.  Allergies: No Known Allergies  Current Medications: Current Outpatient Medications  Medication Sig Dispense Refill   acetaminophen (TYLENOL) 500 MG tablet Take 500 mg by mouth every 6 (six) hours as needed.     amLODipine (NORVASC) 2.5 MG tablet Take 2.5 mg by mouth daily.     aspirin EC 81 MG tablet Take 81 mg by mouth daily.     Cholecalciferol (VITAMIN D) 125 MCG (5000 UT) CAPS Take 5,000 mg by mouth.     Probiotic Product (PROBIOTIC DAILY PO) Take 1 tablet by mouth daily.     cephALEXin (KEFLEX) 500 MG capsule Take 500 mg by mouth 3 (three) times daily.  (Patient not taking: No sig reported)     docusate sodium (COLACE) 100 MG capsule Take 100 mg by mouth 2 (two) times daily. (Patient not taking: Reported on 11/11/2020)     HYDROcodone-acetaminophen (NORCO/VICODIN) 5-325 MG tablet Take 1 tablet by mouth every 6 (six) hours as needed for moderate pain (up to 3 doses for moderate pain.).  (Patient not taking: Reported on 10/05/2020)     ibuprofen (ADVIL) 800 MG tablet Take 800 mg by mouth every 8 (eight) hours as needed. (Patient not taking: No sig reported)     loratadine (CLARITIN) 10 MG tablet Take 10 mg by mouth daily. (Patient not taking: Reported on 10/05/2020)     NEOMYCIN-POLYMYXIN-HYDROCORTISONE (CORTISPORIN) 1 % SOLN OTIC solution SMARTSIG:Left Ear (Patient not taking: Reported on 11/11/2020)     olmesartan (BENICAR) 20 MG tablet Take 20 mg by mouth daily. (Patient not taking: Reported on 11/11/2020)     olmesartan (BENICAR) 20 MG tablet Take 1 tablet by mouth daily. (Patient not taking: Reported on 11/11/2020)     polyethylene glycol-electrolytes (NULYTELY) 420 g solution Take by  mouth as directed. (Patient not taking: Reported on 11/11/2020)     potassium chloride SA (KLOR-CON) 20 MEQ tablet Take by mouth. (Patient not taking: No sig reported)     No current facility-administered medications for this visit.   Facility-Administered Medications Ordered in Other Visits  Medication Dose Route Frequency Provider Last Rate Last Admin   0.9 %  sodium chloride infusion   Intravenous Once Corcoran, Melissa C, MD       0.9 %  sodium chloride infusion   Intravenous Continuous Nolon Stalls C, MD 10 mL/hr at 12/31/19 1000 New Bag at 10/05/20 1045   heparin lock flush 100 unit/mL  500 Units Intravenous Once Lequita Asal, MD        Performance status (ECOG): 1  Vitals Blood pressure 138/69, pulse 62, temperature (!) 97.3 F (36.3 C), resp. rate 20, weight 270  lb 6.3 oz (122.7 kg), SpO2 97 %.   Physical Exam Vitals and nursing note reviewed.  Constitutional:      General: He is not in acute distress.    Appearance: Normal appearance. He is well-developed. He is obese. He is not diaphoretic.     Interventions: Face mask in place.     Comments: Patient walks with a cane  HENT:     Head: Normocephalic and atraumatic.     Comments: Gray hair and beard.    Mouth/Throat:     Mouth: Mucous membranes are moist.     Pharynx: Oropharynx is clear.  Eyes:     General: No scleral icterus.    Extraocular Movements: Extraocular movements intact.     Conjunctiva/sclera: Conjunctivae normal.     Pupils: Pupils are equal, round, and reactive to light.  Cardiovascular:     Rate and Rhythm: Normal rate and regular rhythm.     Heart sounds: Normal heart sounds. No murmur heard. Pulmonary:     Effort: Pulmonary effort is normal. No respiratory distress.     Breath sounds: No wheezing or rales.     Comments: Decreased breath sound bilaterally Chest:     Chest wall: No tenderness.  Breasts:    Right: No axillary adenopathy or supraclavicular adenopathy.     Left: No  axillary adenopathy or supraclavicular adenopathy.  Abdominal:     General: Bowel sounds are normal. There is no distension.     Palpations: Abdomen is soft. There is no mass.     Tenderness: There is no abdominal tenderness. There is no guarding or rebound.  Musculoskeletal:        General: No swelling or tenderness. Normal range of motion.     Cervical back: Normal range of motion and neck supple.     Right lower leg: Edema present.     Left lower leg: Edema present.  Lymphadenopathy:     Head:     Right side of head: No preauricular, posterior auricular or occipital adenopathy.     Left side of head: No preauricular, posterior auricular or occipital adenopathy.     Cervical: No cervical adenopathy.     Upper Body:     Right upper body: No supraclavicular or axillary adenopathy.     Left upper body: No supraclavicular or axillary adenopathy.     Lower Body: No right inguinal adenopathy. No left inguinal adenopathy.  Skin:    General: Skin is warm and dry.  Neurological:     General: No focal deficit present.     Mental Status: He is alert and oriented to person, place, and time. Mental status is at baseline.  Psychiatric:        Mood and Affect: Mood normal.   No visits with results within 3 Day(s) from this visit.  Latest known visit with results is:  Hospital Outpatient Visit on 11/03/2020  Component Date Value Ref Range Status   Glucose-Capillary 11/03/2020 96  70 - 99 mg/dL Final   Glucose reference range applies only to samples taken after fasting for at least 8 hours.    Assessment and plan 1. 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 Cancer of transverse colon ([place])   3. Goals of care, counseling/discussion   4. Port-A-Cath in place    #Metastatic colon cancer, 11/03/2020 PET scan showed recurrent peritoneal metastasis near pancreas. Disease progression was discussed with patient. Recommend systemic chemotherapy. Given that he has no other distant metastasis.  Repeat SBRT may be considered.  I will discussed with Dr. Baruch Gouty. Discussed with patient and daughter about rationale of systemic chemotherapy.  Consider FOLFIRI plus bevacizumab treatments.  Considering his age and multiple other conditions, may start with 5-FU plus bevacizumab and at Irinotecan if he tolerates well, will need deeper response. he has previously declined bevacizumab. Today we discussed in details about the potential side effects of bevacizumab including but not limited to hypertension, stroke, MI, proteinuria, kidney impairment, bleeding, perforation etc. Patient is undecided and would like to think about and update me.  # Port-A-Cath in place, Recommend port flush every 8 weeks if not using Mediport.  Patient will get port flush today.  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  Declined gabapentin previously.  Continue to monitor . RTC  to be determined  I discussed the assessment and treatment plan with the patient.  The patient was provided an opportunity to ask questions and all were answered.  The patient agreed with the plan and demonstrated an understanding of the instructions.  The patient was advised to call back if the symptoms worsen or if the condition fails to improve as anticipated.  [name], MD, PhD Hematology Oncology [facility] 11/11/2020

## 2020-11-11 NOTE — Progress Notes (Signed)
DISCONTINUE ON PATHWAY REGIMEN - Colorectal     A cycle is every 14 days:     Oxaliplatin      Leucovorin      Fluorouracil      Fluorouracil   **Always confirm dose/schedule in your pharmacy ordering system**  REASON: Disease Progression PRIOR TREATMENT: [id number]: [id number] q14 Days TREATMENT RESPONSE: Progressive Disease (PD)  START ON PATHWAY REGIMEN - Colorectal     A cycle is every 14 days:     Bevacizumab-xxxx      Irinotecan      Leucovorin      Fluorouracil      Fluorouracil   **Always confirm dose/schedule in your pharmacy ordering system**  Patient Characteristics: Distant Metastases, Nonsurgical Candidate, KRAS/NRAS Mutation Positive/Unknown (BRAF V600 Wild-Type/Unknown), Standard Cytotoxic Therapy, Second Line Standard Cytotoxic Therapy, Bevacizumab Eligible Tumor Location: Colon Therapeutic Status: Distant Metastases Microsatellite/Mismatch Repair Status: MSS/pMMR BRAF Mutation Status: Wild-Type (no mutation) KRAS/NRAS Mutation Status: Mutation Positive Standard Cytotoxic Line of Therapy: Second Line Standard Cytotoxic Therapy Bevacizumab Eligibility: Eligible Intent of Therapy: Non-Curative / Palliative Intent, Discussed with Patient

## 2020-11-16 ENCOUNTER — Telehealth: Payer: Self-pay

## 2020-11-16 ENCOUNTER — Other Ambulatory Visit: Payer: Self-pay | Admitting: Oncology

## 2020-11-16 DIAGNOSIS — C786 Secondary malignant neoplasm of retroperitoneum and peritoneum: Secondary | ICD-10-CM

## 2020-11-16 DIAGNOSIS — Z85038 Personal history of other malignant neoplasm of large intestine: Secondary | ICD-10-CM

## 2020-11-16 MED ORDER — ONDANSETRON HCL 8 MG PO TABS: 8.0000 mg | ORAL_TABLET | Freq: Two times a day (BID) | ORAL | 1 refills | Status: DC | PRN

## 2020-11-16 MED ORDER — PROCHLORPERAZINE MALEATE 10 MG PO TABS: 10.0000 mg | ORAL_TABLET | Freq: Four times a day (QID) | ORAL | 1 refills | Status: DC | PRN

## 2020-11-16 MED ORDER — LIDOCAINE-PRILOCAINE 2.5-2.5 % EX CREA: TOPICAL_CREAM | CUTANEOUS | 3 refills | Status: DC

## 2020-11-16 NOTE — Telephone Encounter (Signed)
-----   Message from [name], MD sent at 11/16/2020  1:01 PM EDT ----- I talked to Mrs.Jared Gutierrez. recommend him to start systemic chemotherapy. - [place] patient.  Please arrange him to do lab md 5-FU + Bevacitzumab next Thursday. Thanks.

## 2020-11-16 NOTE — Telephone Encounter (Signed)
Please schedule as MD recommends for *new* 5 fu/Bevacitzumab; d/c pump day 3

## 2020-11-18 ENCOUNTER — Encounter: Payer: Self-pay | Admitting: Oncology

## 2020-11-25 ENCOUNTER — Inpatient Hospital Stay (HOSPITAL_BASED_OUTPATIENT_CLINIC_OR_DEPARTMENT_OTHER): Payer: Medicare Other | Admitting: Oncology

## 2020-11-25 ENCOUNTER — Ambulatory Visit: Payer: Medicare Other

## 2020-11-25 ENCOUNTER — Inpatient Hospital Stay: Payer: Medicare Other | Attending: Oncology

## 2020-11-25 ENCOUNTER — Other Ambulatory Visit: Payer: Self-pay

## 2020-11-25 ENCOUNTER — Encounter: Payer: Self-pay | Admitting: Oncology

## 2020-11-25 VITALS — BP 124/72 | HR 69 | Temp 98.5°F | Resp 18 | Wt 268.7 lb

## 2020-11-25 DIAGNOSIS — Z5111 Encounter for antineoplastic chemotherapy: Secondary | ICD-10-CM

## 2020-11-25 DIAGNOSIS — G620 Drug-induced polyneuropathy: Secondary | ICD-10-CM | POA: Diagnosis not present

## 2020-11-25 DIAGNOSIS — Z809 Family history of malignant neoplasm, unspecified: Secondary | ICD-10-CM | POA: Diagnosis not present

## 2020-11-25 DIAGNOSIS — Z803 Family history of malignant neoplasm of breast: Secondary | ICD-10-CM | POA: Insufficient documentation

## 2020-11-25 DIAGNOSIS — Z5112 Encounter for antineoplastic immunotherapy: Secondary | ICD-10-CM | POA: Insufficient documentation

## 2020-11-25 DIAGNOSIS — K648 Other hemorrhoids: Secondary | ICD-10-CM | POA: Diagnosis not present

## 2020-11-25 DIAGNOSIS — C184 Malignant neoplasm of transverse colon: Secondary | ICD-10-CM | POA: Insufficient documentation

## 2020-11-25 DIAGNOSIS — D72819 Decreased white blood cell count, unspecified: Secondary | ICD-10-CM | POA: Diagnosis not present

## 2020-11-25 DIAGNOSIS — C189 Malignant neoplasm of colon, unspecified: Secondary | ICD-10-CM

## 2020-11-25 DIAGNOSIS — Z79899 Other long term (current) drug therapy: Secondary | ICD-10-CM | POA: Diagnosis not present

## 2020-11-25 DIAGNOSIS — Z836 Family history of other diseases of the respiratory system: Secondary | ICD-10-CM | POA: Diagnosis not present

## 2020-11-25 DIAGNOSIS — T451X5D Adverse effect of antineoplastic and immunosuppressive drugs, subsequent encounter: Secondary | ICD-10-CM

## 2020-11-25 DIAGNOSIS — Z86010 Personal history of colonic polyps: Secondary | ICD-10-CM | POA: Insufficient documentation

## 2020-11-25 DIAGNOSIS — C786 Secondary malignant neoplasm of retroperitoneum and peritoneum: Secondary | ICD-10-CM | POA: Insufficient documentation

## 2020-11-25 DIAGNOSIS — Z85038 Personal history of other malignant neoplasm of large intestine: Secondary | ICD-10-CM

## 2020-11-25 DIAGNOSIS — M109 Gout, unspecified: Secondary | ICD-10-CM | POA: Diagnosis not present

## 2020-11-25 DIAGNOSIS — M7989 Other specified soft tissue disorders: Secondary | ICD-10-CM | POA: Diagnosis not present

## 2020-11-25 DIAGNOSIS — G629 Polyneuropathy, unspecified: Secondary | ICD-10-CM | POA: Insufficient documentation

## 2020-11-25 DIAGNOSIS — T451X5A Adverse effect of antineoplastic and immunosuppressive drugs, initial encounter: Secondary | ICD-10-CM

## 2020-11-25 LAB — CBC WITH DIFFERENTIAL/PLATELET
Abs Immature Granulocytes: 0.02 10*3/uL (ref 0.00–0.07)
Basophils Absolute: 0 10*3/uL (ref 0.0–0.1)
Basophils Relative: 1 %
Eosinophils Absolute: 0.1 10*3/uL (ref 0.0–0.5)
Eosinophils Relative: 2 %
HCT: 39.9 % (ref 39.0–52.0)
Hemoglobin: 13.5 g/dL (ref 13.0–17.0)
Immature Granulocytes: 0 %
Lymphocytes Relative: 25 %
Lymphs Abs: 1.4 10*3/uL (ref 0.7–4.0)
MCH: 31.4 pg (ref 26.0–34.0)
MCHC: 33.8 g/dL (ref 30.0–36.0)
MCV: 92.8 fL (ref 80.0–100.0)
Monocytes Absolute: 0.4 10*3/uL (ref 0.1–1.0)
Monocytes Relative: 8 %
Neutro Abs: 3.5 10*3/uL (ref 1.7–7.7)
Neutrophils Relative %: 64 %
Platelets: 203 10*3/uL (ref 150–400)
RBC: 4.3 MIL/uL (ref 4.22–5.81)
RDW: 12.3 % (ref 11.5–15.5)
WBC: 5.5 10*3/uL (ref 4.0–10.5)
nRBC: 0 % (ref 0.0–0.2)

## 2020-11-25 LAB — COMPREHENSIVE METABOLIC PANEL
ALT: 38 U/L (ref 0–44)
AST: 42 U/L — ABNORMAL HIGH (ref 15–41)
Albumin: 4 g/dL (ref 3.5–5.0)
Alkaline Phosphatase: 60 U/L (ref 38–126)
Anion gap: 7 (ref 5–15)
BUN: 12 mg/dL (ref 8–23)
CO2: 28 mmol/L (ref 22–32)
Calcium: 9.3 mg/dL (ref 8.9–10.3)
Chloride: 103 mmol/L (ref 98–111)
Creatinine, Ser: 0.79 mg/dL (ref 0.61–1.24)
GFR, Estimated: >60 mL/min (ref >60)
Glucose, Bld: 117 mg/dL — ABNORMAL HIGH (ref 70–99)
Potassium: 3.8 mmol/L (ref 3.5–5.1)
Sodium: 138 mmol/L (ref 135–145)
Total Bilirubin: 0.9 mg/dL (ref 0.3–1.2)
Total Protein: 7.5 g/dL (ref 6.5–8.1)

## 2020-11-25 LAB — PROTEIN, URINE, RANDOM: Total Protein, Urine: 8 mg/dL

## 2020-11-25 NOTE — Progress Notes (Signed)
Patient here for follow. Pt reports nagging pain to left ribs

## 2020-11-25 NOTE — Progress Notes (Signed)
[facility]  [address] [place], [place] [address] Phone: [phone]  Fax: [phone]   Clinic Day: 11/25/2020   Referring physician: Tracie Harrier, MD  Chief Complaint: Jared Gutierrez is a 80 y.o. male presents for follow-up of metastatic colon cancer   PERTINENT ONCOLOGY HISTORY Jared Gutierrez is a 80 y.o.[place] who has above oncology history reviewed by me today presented for follow up visit for management of  Metastatic colon cancer. [name], patient switched care to me on 11/11/20 Extensive medical record review was performed by me  # Metastatic colon cancer.  he was diagnosed with stage I colon cancer s/p transverse colectomy on 10/17/2013.  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  Tumor extended into the submucosa. Margins were negative. + lymphovascular invasion. 14 lymph nodes were negative. Pathologic stage was T1 N0.   10/02/2014 Colonoscopy showed several 3 mm polyps and an 8 mm polyp in the cecum and transverse colon.  Pathology revealed tubular adenomas negative for high-grade dysplasia and malignancy.  02/16/2017 Colonoscopy  revealed 2 diminutive polyps in the descending colon.  Pathology revealed tubular adenomas without dysplasia or malignancy.  Her CEA has been monitored and was noted to increase starting July 2020. 04/02/2019  PET scan  limited evealed a 2.4 x 2.3 cm (SUV 11) hypermetabolic soft tissue density caudal and anterior to the pancreatic neck favored to represent isolated peritoneal or nodal metastasis in the setting of prior transverse colonic resection (expected primary drainage). Although this was immediately adjacent to the pancreas, a fat plane was maintained, arguing strongly against a pancreatic primary. Otherwise, there was no evidence of hypermetabolic metastasis.    04/17/2019 EUS on  revealed a normal esophagus, stomach, duodenum, and pancreas.  There was a 2.4 x 2.4 cm irregular mass in the retroperitoneum adjacent to, but not involving the pancreatic neck.  FNA and core needle biopsy were performed.  Pathology revealed adenocarcinoma compatible with a metastatic lesion of colorectal origin.  Tumor cells were positive for CK20 and CDX2 and negative for CK7.   NGS: [employer] on 05/15/2019 revealed + KRASG13D and TP53.  Negative results included BRAF V600E, Her2, NRAS, NTRK, PD-L1 (<1%), and TMB 8.7/Mb (intermediate).  MMR testing from his colon resection on 10/16/2013 was intact with a low probability of MSI-H.  06/25/2019, CEA 17.8. 07/09/2019 - 08/27/2019; 09/17/2019 - 10/15/2019; 11/12/2019 - 12/17/2019 He received 11 cycles of FOLFOX chemotherapy and 1 cycle of 5-FU and leucovorin (12/31/2019).  He received Neulasta after cycle #4 and #5 secondary to progressive leukopenia.  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  He received a truncated course of FOLFOX with cycle #11 secondary to oxaliplatin reaction.  08/27/2019, CEA 6.1 10/29/2019, CEA 7.7 12/31/2019, CEA 10.8.  01/14/2020 PET revealed an interval decrease in size and FDG uptake (2.4 x 2.3 cm with SUV 11 to 2.4 x 1.7 cm with SUV 4.09) associated with the previously referenced soft tissue density caudal and anterior to the pancreatic neck suggesting treatment response. There were no new sites of FDG avid tumor.  Radiation treatment 02/10/2020 - 02/23/2020.   He received a hybrid 3-dimensional course of 3000 cGy in 10 fractions to the residual FDG avid mass   05/27/2020, CEA 11.6 05/27/2020 Abdomen and pelvis CT :stable to minimally increased (2.9 x 1.8 cm compared to 2.4 x 1.7 cm) since 01/14/2020.  There were no new interval findings.  08/31/2020, CEA 30.9. 08/31/2020 Abdomen and pelvis CT revealed increased size of the index soft tissue lesion inferior and anterior to the pancreatic neck  (2.9 x 1.8 cm to 3.5 x 2.9 cm). There were similar prominent retroperitoneal lymph nodes without adenopathy by size criteria. There was no new or enlarging abdominal or pelvic lymph nodes. There were no new interval findings. There was similar circumferential wall thickening of a nondistended urinary bladder, which likely accentuated wall thickening There was hepatic steatosis and aortic atherosclerosis.  11/03/2020, PET showed recurrent peritoneal metastasis in the upper abdomen adjacent to the pancreas.  The lesion is 3.8 x 2.9 cm with SUV of 11.3. No evidence of metastatic peritoneal disease elsewhere in the abdomen pelvis.  No evidence of distant metastasis.  Other medical problems Chronic lower extremity edema. 12/24/2018, right lower extremity duplex negative for DVT.  Small right Baker's cyst. 08/16/2019, bilateral lower extremity duplex showed no DVT.  08/16/2019 - 08/17/2019 with right lower extremity cellulitis.  He was unable to bear weight.  He was treated with IV fluids, NSAIDs, colchicine, and broad antibiotics (vancomycin and Cefepime).  He was discharged on indomethacin x 5 days and Keflex 500 mg TID x 5 days.  10/05/2020, colonoscopy showed internal hemorrhoids.  Otherwise normal examination. 11/03/2020 PET scan showed recurrent peritoneal metastasis near pancreas. Given that he has no other distant metastasis.  Repeat SBRT may be considered if he does not respond well to systemic chemotherapy.  Discussed with radiation oncology.  INTERVAL HISTORY Jared Gutierrez is a 80 y.o. male who has above history reviewed by me today presents for follow up visit for management of recurrent metastatic colon cancer Problems and complaints are listed below: he was accompanied by wife Patient denies pain.  He has chronic bilateral lower extremity edema.  Chronic shortness of breath with exertion.   Review of Systems  Constitutional:  Negative for appetite change, chills, diaphoresis, fever and  unexpected weight change.  HENT:   Negative for hearing loss, nosebleeds, sore throat and tinnitus.   Respiratory:  Positive for shortness of breath. Negative for cough and hemoptysis.   Cardiovascular:  Positive for leg swelling. Negative for chest pain and palpitations.  Gastrointestinal:  Negative for abdominal pain, blood in stool, constipation, diarrhea, nausea and vomiting.  Genitourinary:  Negative for dysuria, frequency and hematuria.   Musculoskeletal:  Negative for back pain, myalgias and neck pain.  Skin:  Negative for itching and rash.  Neurological:  Negative for dizziness and headaches.  Hematological:  Does not bruise/bleed easily.  Psychiatric/Behavioral:  Negative for depression. The patient is not nervous/anxious.   All other systems reviewed and are negative.    Past Medical History:  Diagnosis Date   Arthritis    OSTEOARTHRITIS   Cancer ([place])    Cavitary lesion of lung    RIGHT LOWER LOBE   Chicken pox    Colon cancer (HCC)    History of kidney stones    Hypertension    Lipoma of colon    Nephrolithiasis    Nephrolithiasis    Obesity    Shingles    Tubular adenoma of colon    multiple fragments    Past Surgical History:  Procedure Laterality Date   COLON SURGERY     COLONOSCOPY N/A 10/02/2014   Procedure: COLONOSCOPY;  Surgeon: Josefine Class, MD;  Location: [facility] ENDOSCOPY;  Service: Endoscopy;  Laterality: N/A;   COLONOSCOPY WITH PROPOFOL N/A 02/16/2017   Procedure: COLONOSCOPY WITH PROPOFOL;  Surgeon: Gaylyn Cheers  T, MD;  Location: [facility] ENDOSCOPY;  Service: Endoscopy;  Laterality: N/A;   COLONOSCOPY WITH PROPOFOL N/A 10/05/2020   Procedure: COLONOSCOPY WITH PROPOFOL;  Surgeon: Lesly Rubenstein, MD;  Location: [facility] ENDOSCOPY;  Service: Endoscopy;  Laterality: N/A;   EUS N/A 04/17/2019   Procedure: FULL UPPER ENDOSCOPIC ULTRASOUND (EUS) RADIAL;  Surgeon: Holly Bodily, MD;  Location: [facility] ENDOSCOPY;  Service: Gastroenterology;   Laterality: N/A;   KIDNEY STONE SURGERY     PARTIAL COLECTOMY  10/17/2013   PORTACATH PLACEMENT Right 06/13/2019   Procedure: INSERTION PORT-A-CATH;  Surgeon: Benjamine Sprague, DO;  Location: [facility] ORS;  Service: General;  Laterality: Right;    Family History  Problem Relation Age of Onset   Cancer Mother    Breast cancer Mother    COPD Father     Social History:  reports that he has never smoked. He has never used smokeless tobacco. He reports current alcohol use. He reports that he does not use drugs. 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 He is married and his wife's name is [name] [phone]).  The patient is alone today.  Allergies: No Known Allergies  Current Medications: Current Outpatient Medications  Medication Sig Dispense Refill   acetaminophen (TYLENOL) 500 MG tablet Take 500 mg by mouth every 6 (six) hours as needed.     amLODipine (NORVASC) 2.5 MG tablet Take 2.5 mg by mouth daily.     aspirin EC 81 MG tablet Take 81 mg by mouth daily.     Cholecalciferol (VITAMIN D) 125 MCG (5000 UT) CAPS Take 5,000 mg by mouth.     HYDROcodone-acetaminophen (NORCO/VICODIN) 5-325 MG tablet Take 1 tablet by mouth every 6 (six) hours as needed for moderate pain (up to 3 doses for moderate pain.).     lidocaine-prilocaine (EMLA) cream Apply to affected area once 30 g 3   olmesartan (BENICAR) 20 MG tablet Take 1 tablet by mouth daily.     ondansetron (ZOFRAN) 8 MG tablet Take 1 tablet (8 mg total) by mouth 2 (two) times daily as needed for refractory nausea / vomiting. Start on day 3 after chemotherapy. 60 tablet 1   potassium chloride SA (KLOR-CON) 20 MEQ tablet Take by mouth.     Probiotic Product (PROBIOTIC DAILY PO) Take 1 tablet by mouth daily.     prochlorperazine (COMPAZINE) 10 MG tablet Take 1 tablet (10 mg total) by mouth every 6 (six) hours as needed (NAUSEA). 30 tablet 1   docusate sodium (COLACE) 100 MG capsule Take 100 mg by mouth 2 (two) times daily.  (Patient not taking: No sig reported)     ibuprofen (ADVIL) 800 MG tablet Take 800 mg by mouth every 8 (eight) hours as needed. (Patient not taking: Reported on 11/25/2020)     loratadine (CLARITIN) 10 MG tablet Take 10 mg by mouth daily. (Patient not taking: No sig reported)     NEOMYCIN-POLYMYXIN-HYDROCORTISONE (CORTISPORIN) 1 % SOLN OTIC solution SMARTSIG:Left Ear (Patient not taking: No sig reported)     polyethylene glycol-electrolytes (NULYTELY) 420 g solution Take by mouth as directed. (Patient not taking: No sig reported)     No current facility-administered medications for this visit.   Facility-Administered Medications Ordered in Other Visits  Medication Dose Route Frequency Provider Last Rate Last Admin   0.9 %  sodium chloride infusion   Intravenous Once Corcoran, Melissa C, MD       0.9 %  sodium chloride infusion  Intravenous Continuous Lequita Asal, MD 10 mL/hr at 12/31/19 1000 New Bag at 10/05/20 1045   heparin lock flush 100 unit/mL  500 Units Intravenous Once Lequita Asal, MD        Performance status (ECOG): 1  Vitals Blood pressure 124/72, pulse 69, temperature 98.5 F (36.9 C), resp. rate 18, weight 268 lb 11.9 oz (121.9 kg).   Physical Exam Vitals and nursing note reviewed.  Constitutional:      General: He is not in acute distress.    Appearance: Normal appearance. He is well-developed. He is obese. He is not diaphoretic.     Interventions: Face mask in place.     Comments: Patient walks with a cane  HENT:     Head: Normocephalic and atraumatic.     Comments: Gray hair and beard.    Mouth/Throat:     Mouth: Mucous membranes are moist.     Pharynx: Oropharynx is clear.  Eyes:     General: No scleral icterus.    Extraocular Movements: Extraocular movements intact.     Conjunctiva/sclera: Conjunctivae normal.     Pupils: Pupils are equal, round, and reactive to light.  Cardiovascular:     Rate and Rhythm: Normal rate and regular rhythm.      Heart sounds: Normal heart sounds. No murmur heard. Pulmonary:     Effort: Pulmonary effort is normal. No respiratory distress.     Breath sounds: No wheezing or rales.     Comments: Decreased breath sound bilaterally Chest:     Chest wall: No tenderness.  Breasts:    Right: No axillary adenopathy or supraclavicular adenopathy.     Left: No axillary adenopathy or supraclavicular adenopathy.  Abdominal:     General: Bowel sounds are normal. There is no distension.     Palpations: Abdomen is soft. There is no mass.     Tenderness: There is no abdominal tenderness. There is no guarding or rebound.  Musculoskeletal:        General: No swelling or tenderness. Normal range of motion.     Cervical back: Normal range of motion and neck supple.     Right lower leg: Edema present.     Left lower leg: Edema present.  Lymphadenopathy:     Head:     Right side of head: No preauricular, posterior auricular or occipital adenopathy.     Left side of head: No preauricular, posterior auricular or occipital adenopathy.     Cervical: No cervical adenopathy.     Upper Body:     Right upper body: No supraclavicular or axillary adenopathy.     Left upper body: No supraclavicular or axillary adenopathy.     Lower Body: No right inguinal adenopathy. No left inguinal adenopathy.  Skin:    General: Skin is warm and dry.  Neurological:     General: No focal deficit present.     Mental Status: He is alert and oriented to person, place, and time. Mental status is at baseline.  Psychiatric:        Mood and Affect: Mood normal.   Appointment on 11/25/2020  Component Date Value Ref Range Status   Sodium 11/25/2020 138  135 - 145 mmol/L Final   Potassium 11/25/2020 3.8  3.5 - 5.1 mmol/L Final   Chloride 11/25/2020 103  98 - 111 mmol/L Final   CO2 11/25/2020 28  22 - 32 mmol/L Final   Glucose, Bld 11/25/2020 117 (A) 70 - 99 mg/dL Final  Glucose reference range applies only to samples taken after fasting for  at least 8 hours.   BUN 11/25/2020 12  8 - 23 mg/dL Final   Creatinine, Ser 11/25/2020 0.79  0.61 - 1.24 mg/dL Final   Calcium 11/25/2020 9.3  8.9 - 10.3 mg/dL Final   Total Protein 11/25/2020 7.5  6.5 - 8.1 g/dL Final   Albumin 11/25/2020 4.0  3.5 - 5.0 g/dL Final   AST 11/25/2020 42 (A) 15 - 41 U/L Final   ALT 11/25/2020 38  0 - 44 U/L Final   Alkaline Phosphatase 11/25/2020 60  38 - 126 U/L Final   Total Bilirubin 11/25/2020 0.9  0.3 - 1.2 mg/dL Final   GFR, Estimated 11/25/2020 >60  >60 mL/min Final   Comment: (NOTE) Calculated using the CKD-EPI Creatinine Equation (2021)    Anion gap 11/25/2020 7  5 - 15 Final   Performed at [facility], [address], [place], [place] [address]   WBC 11/25/2020 5.5  4.0 - 10.5 K/uL Final   RBC 11/25/2020 4.30  4.22 - 5.81 MIL/uL Final   Hemoglobin 11/25/2020 13.5  13.0 - 17.0 g/dL Final   HCT 11/25/2020 39.9  39.0 - 52.0 % Final   MCV 11/25/2020 92.8  80.0 - 100.0 fL Final   MCH 11/25/2020 31.4  26.0 - 34.0 pg Final   MCHC 11/25/2020 33.8  30.0 - 36.0 g/dL Final   RDW 11/25/2020 12.3  11.5 - 15.5 % Final   Platelets 11/25/2020 203  150 - 400 K/uL Final   nRBC 11/25/2020 0.0  0.0 - 0.2 % Final   Neutrophils Relative % 11/25/2020 64  % Final   Neutro Abs 11/25/2020 3.5  1.7 - 7.7 K/uL Final   Lymphocytes Relative 11/25/2020 25  % Final   Lymphs Abs 11/25/2020 1.4  0.7 - 4.0 K/uL Final   Monocytes Relative 11/25/2020 8  % Final   Monocytes Absolute 11/25/2020 0.4  0.1 - 1.0 K/uL Final   Eosinophils Relative 11/25/2020 2  % Final   Eosinophils Absolute 11/25/2020 0.1  0.0 - 0.5 K/uL Final   Basophils Relative 11/25/2020 1  % Final   Basophils Absolute 11/25/2020 0.0  0.0 - 0.1 K/uL Final   Immature Granulocytes 11/25/2020 0  % Final   Abs Immature Granulocytes 11/25/2020 0.02  0.00 - 0.07 K/uL Final   Performed at [facility], [address], [place], [place] [address]   Total Protein, Urine 11/25/2020 8  mg/dL  Final   Comment: NO NORMAL RANGE ESTABLISHED FOR THIS TEST Performed at [facility], [address], [place], [place] [address]     Assessment and plan 1. 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 Encounter for antineoplastic chemotherapy    #Metastatic colon cancer-recurrent During last visit, patient was accompanied by daughter.  Today he was accompanied by wife.  We discussed again about rationale of FOLFIRI plus bevacizumab. Considering his age and multiple other comorbidities, I will start with 5-FU and bevacizumab. Potential side effects of chemotherapy and bevacizumab were discussed in details with patient and wife.  Both are in agreement with proceeding with treatment. Obtain random urine protein.  CBC and CMP are reviewed and discussed with patient. Patient will proceed with 5-FU/bevacizumab on 11/26/2020 Today for pump discontinuation at [facility] site on 11/29/2020. In the future, patient prefers to come to [facility] for additional treatments.  # Chemotherapy induced neuropathy He has grade  I neuropathy in his fingertips and toes.  Declined gabapentin previously.  Monitor symptoms. [name] RTC 1 week lab MD CBC CMP/IV fluid for discussion of tolerability. 2 weeks lab MD 5-FU/bevacizumab at [facility]  I discussed the assessment and treatment plan with the patient.  The patient was provided an opportunity to ask questions and all were answered.  The patient agreed with the plan and demonstrated an understanding of the instructions.  The patient was advised to call back if the symptoms worsen or if the condition fails to improve as anticipated.  [name], MD, PhD Hematology Oncology [place] at [facility] 11/25/2020

## 2020-11-26 ENCOUNTER — Inpatient Hospital Stay: Payer: Medicare Other

## 2020-11-26 VITALS — BP 150/83 | HR 61 | Temp 97.8°F | Resp 18

## 2020-11-26 DIAGNOSIS — C786 Secondary malignant neoplasm of retroperitoneum and peritoneum: Secondary | ICD-10-CM

## 2020-11-26 DIAGNOSIS — Z85038 Personal history of other malignant neoplasm of large intestine: Secondary | ICD-10-CM

## 2020-11-26 DIAGNOSIS — Z5112 Encounter for antineoplastic immunotherapy: Secondary | ICD-10-CM | POA: Diagnosis not present

## 2020-11-26 MED ORDER — BEVACIZUMAB-BVZR CHEMO INJECTION 400 MG/16ML
5.0000 mg/kg | Freq: Once | INTRAVENOUS | Status: AC
  Administered 2020-11-26: 600 mg via INTRAVENOUS
  Filled 2020-11-26: qty 16

## 2020-11-26 MED ORDER — SODIUM CHLORIDE 0.9 % IV SOLN
Freq: Once | INTRAVENOUS | Status: AC
  Filled 2020-11-26: qty 250

## 2020-11-26 MED ORDER — SODIUM CHLORIDE 0.9 % IV SOLN
1000.0000 mg | Freq: Once | INTRAVENOUS | Status: AC
  Administered 2020-11-26: 1000 mg via INTRAVENOUS
  Filled 2020-11-26: qty 50

## 2020-11-26 MED ORDER — SODIUM CHLORIDE 0.9 % IV SOLN
2400.0000 mg/m2 | INTRAVENOUS | Status: DC
  Administered 2020-11-26: 5800 mg via INTRAVENOUS
  Filled 2020-11-26: qty 116

## 2020-11-26 MED ORDER — SODIUM CHLORIDE 0.9% FLUSH
10.0000 mL | INTRAVENOUS | Status: DC | PRN
  Administered 2020-11-26: 10 mL
  Filled 2020-11-26: qty 10

## 2020-11-26 MED ORDER — SODIUM CHLORIDE 0.9 % IV SOLN
10.0000 mg | Freq: Once | INTRAVENOUS | Status: AC
  Administered 2020-11-26: 10 mg via INTRAVENOUS
  Filled 2020-11-26: qty 1

## 2020-11-26 MED ORDER — PALONOSETRON HCL INJECTION 0.25 MG/5ML
0.2500 mg | Freq: Once | INTRAVENOUS | Status: AC
Start: 2020-11-26 — End: 2020-11-26
  Administered 2020-11-26: 0.25 mg via INTRAVENOUS

## 2020-11-26 MED ORDER — FLUOROURACIL CHEMO INJECTION 2.5 GM/50ML
400.0000 mg/m2 | Freq: Once | INTRAVENOUS | Status: AC
  Administered 2020-11-26: 950 mg via INTRAVENOUS
  Filled 2020-11-26: qty 19

## 2020-11-26 NOTE — Patient Instructions (Signed)
[facility]  Discharge Instructions: Thank you for choosing [facility] to provide your oncology and hematology care.  If you have a lab appointment with the Cancer Center, please go directly to the Cancer Center and check in at the registration area.  Wear comfortable clothing and clothing appropriate for easy access to any Portacath or PICC line.   We strive to give you quality time with your provider. You may need to reschedule your appointment if you arrive late (15 or more minutes).  Arriving late affects you and other patients whose appointments are after yours.  Also, if you miss three or more appointments without notifying the office, you may be dismissed from the clinic at the provider's discretion.      For prescription refill requests, have your pharmacy contact our office and allow 72 hours for refills to be completed.        To help prevent nausea and vomiting after your treatment, we encourage you to take your nausea medication as directed.  BELOW ARE SYMPTOMS THAT SHOULD BE REPORTED IMMEDIATELY: *FEVER GREATER THAN 100.4 F (38 C) OR HIGHER *CHILLS OR SWEATING *NAUSEA AND VOMITING THAT IS NOT CONTROLLED WITH YOUR NAUSEA MEDICATION *UNUSUAL SHORTNESS OF BREATH *UNUSUAL BRUISING OR BLEEDING *URINARY PROBLEMS (pain or burning when urinating, or frequent urination) *BOWEL PROBLEMS (unusual diarrhea, constipation, pain near the anus) TENDERNESS IN MOUTH AND THROAT WITH OR WITHOUT PRESENCE OF ULCERS (sore throat, sores in mouth, or a toothache) UNUSUAL RASH, SWELLING OR PAIN  UNUSUAL VAGINAL DISCHARGE OR ITCHING   Items with * indicate a potential emergency and should be followed up as soon as possible or go to the Emergency Department if any problems should occur.  Please show the CHEMOTHERAPY ALERT CARD or IMMUNOTHERAPY ALERT CARD at check-in to the Emergency Department and triage nurse.  Should you have questions after your visit or  need to cancel or reschedule your appointment, please contact [facility]  [phone] and follow the prompts.  Office hours are 8:00 a.m. to 4:30 p.m. Monday - Friday. Please note that voicemails left after 4:00 p.m. may not be returned until the following business day.  We are closed weekends and major holidays. You have access to a nurse at all times for urgent questions. Please call the main number to the clinic [phone] and follow the prompts.  For any non-urgent questions, you may also contact your provider using MyChart. We now offer e-Visits for anyone 18 and older to request care online for non-urgent symptoms. For details visit [email or web address].   Also download the MyChart app! Go to the app store, search "MyChart", open the app, select [place], and log in with your MyChart username and password.  Due to Covid, a mask is required upon entering the hospital/clinic. If you do not have a mask, one will be given to you upon arrival. For doctor visits, patients may have 1 support person aged 18 or older with them. For treatment visits, patients cannot have anyone with them due to current Covid guidelines and our immunocompromised population.  

## 2020-11-29 ENCOUNTER — Inpatient Hospital Stay: Payer: Medicare Other

## 2020-11-29 ENCOUNTER — Other Ambulatory Visit: Payer: Self-pay

## 2020-11-29 DIAGNOSIS — Z5112 Encounter for antineoplastic immunotherapy: Secondary | ICD-10-CM | POA: Diagnosis not present

## 2020-11-29 DIAGNOSIS — C786 Secondary malignant neoplasm of retroperitoneum and peritoneum: Secondary | ICD-10-CM

## 2020-11-29 DIAGNOSIS — Z85038 Personal history of other malignant neoplasm of large intestine: Secondary | ICD-10-CM

## 2020-11-29 MED ORDER — HEPARIN SOD (PORK) LOCK FLUSH 100 UNIT/ML IV SOLN
INTRAVENOUS | Status: AC
  Filled 2020-11-29: qty 5

## 2020-11-29 MED ORDER — HEPARIN SOD (PORK) LOCK FLUSH 100 UNIT/ML IV SOLN
500.0000 [IU] | Freq: Once | INTRAVENOUS | Status: AC | PRN
Start: 2020-11-29 — End: 2020-11-29
  Administered 2020-11-29: 500 [IU]
  Filled 2020-11-29: qty 5

## 2020-11-29 MED ORDER — SODIUM CHLORIDE 0.9% FLUSH
10.0000 mL | INTRAVENOUS | Status: DC | PRN
  Administered 2020-11-29: 10 mL
  Filled 2020-11-29: qty 10

## 2020-12-02 ENCOUNTER — Inpatient Hospital Stay (HOSPITAL_BASED_OUTPATIENT_CLINIC_OR_DEPARTMENT_OTHER): Payer: Medicare Other | Admitting: Oncology

## 2020-12-02 ENCOUNTER — Inpatient Hospital Stay: Payer: Medicare Other

## 2020-12-02 ENCOUNTER — Encounter: Payer: Self-pay | Admitting: Oncology

## 2020-12-02 ENCOUNTER — Other Ambulatory Visit: Payer: Self-pay

## 2020-12-02 VITALS — BP 132/62 | HR 64 | Temp 97.8°F | Resp 20 | Wt 268.3 lb

## 2020-12-02 DIAGNOSIS — T451X5A Adverse effect of antineoplastic and immunosuppressive drugs, initial encounter: Secondary | ICD-10-CM | POA: Insufficient documentation

## 2020-12-02 DIAGNOSIS — D649 Anemia, unspecified: Secondary | ICD-10-CM | POA: Insufficient documentation

## 2020-12-02 DIAGNOSIS — Z85038 Personal history of other malignant neoplasm of large intestine: Secondary | ICD-10-CM

## 2020-12-02 DIAGNOSIS — C184 Malignant neoplasm of transverse colon: Secondary | ICD-10-CM | POA: Diagnosis not present

## 2020-12-02 DIAGNOSIS — G620 Drug-induced polyneuropathy: Secondary | ICD-10-CM | POA: Diagnosis not present

## 2020-12-02 DIAGNOSIS — Z5112 Encounter for antineoplastic immunotherapy: Secondary | ICD-10-CM | POA: Diagnosis not present

## 2020-12-02 DIAGNOSIS — C786 Secondary malignant neoplasm of retroperitoneum and peritoneum: Secondary | ICD-10-CM

## 2020-12-02 DIAGNOSIS — D6481 Anemia due to antineoplastic chemotherapy: Secondary | ICD-10-CM | POA: Insufficient documentation

## 2020-12-02 DIAGNOSIS — K5909 Other constipation: Secondary | ICD-10-CM

## 2020-12-02 DIAGNOSIS — C189 Malignant neoplasm of colon, unspecified: Secondary | ICD-10-CM

## 2020-12-02 LAB — CBC WITH DIFFERENTIAL/PLATELET
Abs Immature Granulocytes: 0.01 10*3/uL (ref 0.00–0.07)
Basophils Absolute: 0 10*3/uL (ref 0.0–0.1)
Basophils Relative: 1 %
Eosinophils Absolute: 0.1 10*3/uL (ref 0.0–0.5)
Eosinophils Relative: 2 %
HCT: 37.3 % — ABNORMAL LOW (ref 39.0–52.0)
Hemoglobin: 12.9 g/dL — ABNORMAL LOW (ref 13.0–17.0)
Immature Granulocytes: 0 %
Lymphocytes Relative: 27 %
Lymphs Abs: 1.7 10*3/uL (ref 0.7–4.0)
MCH: 31.5 pg (ref 26.0–34.0)
MCHC: 34.6 g/dL (ref 30.0–36.0)
MCV: 91.2 fL (ref 80.0–100.0)
Monocytes Absolute: 0.2 10*3/uL (ref 0.1–1.0)
Monocytes Relative: 3 %
Neutro Abs: 4.2 10*3/uL (ref 1.7–7.7)
Neutrophils Relative %: 67 %
Platelets: 194 10*3/uL (ref 150–400)
RBC: 4.09 MIL/uL — ABNORMAL LOW (ref 4.22–5.81)
RDW: 11.9 % (ref 11.5–15.5)
WBC: 6.2 10*3/uL (ref 4.0–10.5)
nRBC: 0 % (ref 0.0–0.2)

## 2020-12-02 LAB — COMPREHENSIVE METABOLIC PANEL
ALT: 27 U/L (ref 0–44)
AST: 33 U/L (ref 15–41)
Albumin: 3.6 g/dL (ref 3.5–5.0)
Alkaline Phosphatase: 58 U/L (ref 38–126)
Anion gap: 7 (ref 5–15)
BUN: 10 mg/dL (ref 8–23)
CO2: 27 mmol/L (ref 22–32)
Calcium: 9.1 mg/dL (ref 8.9–10.3)
Chloride: 101 mmol/L (ref 98–111)
Creatinine, Ser: 0.76 mg/dL (ref 0.61–1.24)
GFR, Estimated: >60 mL/min (ref >60)
Glucose, Bld: 145 mg/dL — ABNORMAL HIGH (ref 70–99)
Potassium: 3.6 mmol/L (ref 3.5–5.1)
Sodium: 135 mmol/L (ref 135–145)
Total Bilirubin: 0.9 mg/dL (ref 0.3–1.2)
Total Protein: 6.9 g/dL (ref 6.5–8.1)

## 2020-12-02 MED ORDER — HEPARIN SOD (PORK) LOCK FLUSH 100 UNIT/ML IV SOLN
500.0000 [IU] | Freq: Once | INTRAVENOUS | Status: AC
  Administered 2020-12-02: 500 [IU] via INTRAVENOUS
  Filled 2020-12-02: qty 5

## 2020-12-02 MED ORDER — GABAPENTIN 100 MG PO CAPS
100.0000 mg | ORAL_CAPSULE | Freq: Three times a day (TID) | ORAL | 0 refills | Status: DC
Start: 2020-12-02 — End: 2022-01-31

## 2020-12-02 NOTE — Progress Notes (Signed)
Patient states no knew concerns.

## 2020-12-02 NOTE — Progress Notes (Signed)
[facility]  [address], [address] [place], [place] [address] Phone: [phone]  Fax: [phone]   Clinic Day: 12/02/2020   Referring physician: Tracie Harrier, MD  Chief Complaint: Jared Gutierrez is a 80 y.o. male presents for follow-up of metastatic colon cancer   PERTINENT ONCOLOGY HISTORY Jared Gutierrez is a 80 y.o.[place] who has above oncology history reviewed by me today presented for follow up visit for management of  Metastatic colon cancer. [name], patient switched care to me on 11/11/20 Extensive medical record review was performed by me  # Metastatic colon cancer.  he was diagnosed with stage I colon cancer s/p transverse colectomy on 10/17/2013.  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  Tumor extended into the submucosa. Margins were negative. + lymphovascular invasion. 14 lymph nodes were negative. Pathologic stage was T1 N0.   10/02/2014 Colonoscopy showed several 3 mm polyps and an 8 mm polyp in the cecum and transverse colon.  Pathology revealed tubular adenomas negative for high-grade dysplasia and malignancy.  02/16/2017 Colonoscopy  revealed 2 diminutive polyps in the descending colon.  Pathology revealed tubular adenomas without dysplasia or malignancy.  Her CEA has been monitored and was noted to increase starting July 2020. 04/02/2019  PET scan  limited evealed a 2.4 x 2.3 cm (SUV 11) hypermetabolic soft tissue density caudal and anterior to the pancreatic neck favored to represent isolated peritoneal or nodal metastasis in the setting of prior transverse colonic resection (expected primary drainage). Although this was immediately adjacent to the pancreas, a fat plane was maintained, arguing strongly against a pancreatic primary. Otherwise, there was no evidence of hypermetabolic metastasis.    04/17/2019 EUS on  revealed a normal esophagus, stomach, duodenum, and pancreas.  There was a 2.4 x 2.4 cm irregular mass in the retroperitoneum adjacent to, but not involving the pancreatic neck.  FNA and core needle biopsy were performed.  Pathology revealed adenocarcinoma compatible with a metastatic lesion of colorectal origin.  Tumor cells were positive for CK20 and CDX2 and negative for CK7.   NGS: [employer] on 05/15/2019 revealed + KRASG13D and TP53.  Negative results included BRAF V600E, Her2, NRAS, NTRK, PD-L1 (<1%), and TMB 8.7/Mb (intermediate).  MMR testing from his colon resection on 10/16/2013 was intact with a low probability of MSI-H.  06/25/2019, CEA 17.8. 07/09/2019 - 08/27/2019; 09/17/2019 - 10/15/2019; 11/12/2019 - 12/17/2019 He received 11 cycles of FOLFOX chemotherapy and 1 cycle of 5-FU and leucovorin (12/31/2019).  He received Neulasta after cycle #4 and #5 secondary to progressive leukopenia.  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  He received a truncated course of FOLFOX with cycle #11 secondary to oxaliplatin reaction.  08/27/2019, CEA 6.1 10/29/2019, CEA 7.7 12/31/2019, CEA 10.8.  01/14/2020 PET revealed an interval decrease in size and FDG uptake (2.4 x 2.3 cm with SUV 11 to 2.4 x 1.7 cm with SUV 4.09) associated with the previously referenced soft tissue density caudal and anterior to the pancreatic neck suggesting treatment response. There were no new sites of FDG avid tumor.  Radiation treatment 02/10/2020 - 02/23/2020.   He received a hybrid 3-dimensional course of 3000 cGy in 10 fractions to the residual FDG avid mass   05/27/2020, CEA 11.6 05/27/2020 Abdomen and pelvis CT :stable to minimally increased (2.9 x 1.8 cm compared to 2.4 x 1.7 cm) since 01/14/2020.  There were no new interval findings.  08/31/2020, CEA 30.9. 08/31/2020 Abdomen and pelvis CT revealed increased size of the index soft tissue lesion inferior and anterior to the pancreatic neck  (2.9 x 1.8 cm to 3.5 x 2.9 cm). There were similar prominent retroperitoneal lymph nodes without adenopathy by size criteria. There was no new or enlarging abdominal or pelvic lymph nodes. There were no new interval findings. There was similar circumferential wall thickening of a nondistended urinary bladder, which likely accentuated wall thickening There was hepatic steatosis and aortic atherosclerosis.  11/03/2020, PET showed recurrent peritoneal metastasis in the upper abdomen adjacent to the pancreas.  The lesion is 3.8 x 2.9 cm with SUV of 11.3. No evidence of metastatic peritoneal disease elsewhere in the abdomen pelvis.  No evidence of distant metastasis.  Other medical problems Chronic lower extremity edema. 12/24/2018, right lower extremity duplex negative for DVT.  Small right Baker's cyst. 08/16/2019, bilateral lower extremity duplex showed no DVT.  08/16/2019 - 08/17/2019 with right lower extremity cellulitis.  He was unable to bear weight.  He was treated with IV fluids, NSAIDs, colchicine, and broad antibiotics (vancomycin and Cefepime).  He was discharged on indomethacin x 5 days and Keflex 500 mg TID x 5 days.  10/05/2020, colonoscopy showed internal hemorrhoids.  Otherwise normal examination. 11/03/2020 PET scan showed recurrent peritoneal metastasis near pancreas. Given that he has no other distant metastasis.  Repeat SBRT may be considered if he does not respond well to systemic chemotherapy.  Discussed with radiation oncology.  11/26/2020 patient was started on 5-FU and bevacizumab.  INTERVAL HISTORY Jared MARRAZZO is a 80 y.o. male who has above history reviewed by me today presents for follow up visit for management of recurrent metastatic colon cancer Problems and complaints are listed below: he was accompanied by wife Patient tolerated cycle one 5-FU and bevacizumab.  Denies nausea vomiting diarrhea. Reports constipated.  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  Worsened.  Constant  Review of Systems  Constitutional:  Negative for appetite change, chills, diaphoresis, fever and unexpected weight change.  HENT:   Negative for hearing loss, nosebleeds, sore throat and tinnitus.   Respiratory:  Positive for shortness of breath. Negative for cough and hemoptysis.   Cardiovascular:  Positive for leg swelling. Negative for chest pain and palpitations.  Gastrointestinal:  Negative for abdominal pain, blood in stool, constipation, diarrhea, nausea and vomiting.  Genitourinary:  Negative for dysuria, frequency and hematuria.   Musculoskeletal:  Negative for back pain, myalgias and neck pain.  Skin:  Negative for itching and rash.  Neurological:  Negative for dizziness and headaches.  Hematological:  Does not bruise/bleed easily.  Psychiatric/Behavioral:  Negative for depression. The patient is not nervous/anxious.      Past Medical History:  Diagnosis Date   Arthritis    OSTEOARTHRITIS   Cancer ([place])    Cavitary lesion of lung    RIGHT LOWER LOBE   Chicken pox    Colon cancer (HCC)    History of kidney stones    Hypertension    Lipoma of colon    Nephrolithiasis    Nephrolithiasis    Obesity    Shingles    Tubular adenoma of colon    multiple fragments    Past Surgical History:  Procedure Laterality Date   COLON SURGERY     COLONOSCOPY N/A 10/02/2014   Procedure: COLONOSCOPY;  Surgeon: Josefine Class, MD;  Location: [facility] ENDOSCOPY;  Service: Endoscopy;  Laterality: N/A;   COLONOSCOPY WITH PROPOFOL N/A 02/16/2017  Procedure: COLONOSCOPY WITH PROPOFOL;  Surgeon: Manya Silvas, MD;  Location: [facility] ENDOSCOPY;  Service: Endoscopy;  Laterality: N/A;   COLONOSCOPY WITH PROPOFOL N/A 10/05/2020   Procedure: COLONOSCOPY WITH PROPOFOL;  Surgeon: Lesly Rubenstein, MD;  Location: [facility] ENDOSCOPY;  Service: Endoscopy;  Laterality: N/A;   EUS N/A 04/17/2019   Procedure: FULL UPPER ENDOSCOPIC ULTRASOUND (EUS) RADIAL;   Surgeon: Holly Bodily, MD;  Location[facility] ENDOSCOPY;  Service: Gastroenterology;  Laterality: N/A;   KIDNEY STONE SURGERY     PARTIAL COLECTOMY  10/17/2013   PORTACATH PLACEMENT Right 06/13/2019   Procedure: INSERTION PORT-A-CATH;  Surgeon: Benjamine Sprague, DO;  Location: [facility] ORS;  Service: General;  Laterality: Right;    Family History  Problem Relation Age of Onset   Cancer Mother    Breast cancer Mother    COPD Father     Social History:  reports that he has never smoked. He has never used smokeless tobacco. He reports current alcohol use. He reports that he does not use drugs. 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 He is married and his wife's name is [name] [phone]).  The patient is alone today.  Allergies: No Known Allergies  Current Medications: Current Outpatient Medications  Medication Sig Dispense Refill   acetaminophen (TYLENOL) 500 MG tablet Take 500 mg by mouth every 6 (six) hours as needed.     amLODipine (NORVASC) 2.5 MG tablet Take 2.5 mg by mouth daily.     aspirin EC 81 MG tablet Take 81 mg by mouth daily.     Cholecalciferol (VITAMIN D) 125 MCG (5000 UT) CAPS Take 5,000 mg by mouth.     gabapentin (NEURONTIN) 100 MG capsule Take 1 capsule (100 mg total) by mouth 3 (three) times daily. 90 capsule 0   HYDROcodone-acetaminophen (NORCO/VICODIN) 5-325 MG tablet Take 1 tablet by mouth every 6 (six) hours as needed for moderate pain (up to 3 doses for moderate pain.).     lidocaine-prilocaine (EMLA) cream Apply to affected area once 30 g 3   olmesartan (BENICAR) 20 MG tablet Take 1 tablet by mouth daily.     ondansetron (ZOFRAN) 8 MG tablet Take 1 tablet (8 mg total) by mouth 2 (two) times daily as needed for refractory nausea / vomiting. Start on day 3 after chemotherapy. 60 tablet 1   Probiotic Product (PROBIOTIC DAILY PO) Take 1 tablet by mouth daily.     prochlorperazine (COMPAZINE) 10 MG tablet Take 1 tablet (10 mg total) by  mouth every 6 (six) hours as needed (NAUSEA). 30 tablet 1   docusate sodium (COLACE) 100 MG capsule Take 100 mg by mouth 2 (two) times daily. (Patient not taking: No sig reported)     ibuprofen (ADVIL) 800 MG tablet Take 800 mg by mouth every 8 (eight) hours as needed. (Patient not taking: No sig reported)     loratadine (CLARITIN) 10 MG tablet Take 10 mg by mouth daily. (Patient not taking: No sig reported)     NEOMYCIN-POLYMYXIN-HYDROCORTISONE (CORTISPORIN) 1 % SOLN OTIC solution SMARTSIG:Left Ear (Patient not taking: No sig reported)     polyethylene glycol-electrolytes (NULYTELY) 420 g solution Take by mouth as directed. (Patient not taking: No sig reported)     potassium chloride SA (KLOR-CON) 20 MEQ tablet Take by mouth. (Patient not taking: Reported on 12/02/2020)     No current facility-administered medications for this visit.   Facility-Administered Medications Ordered in Other Visits  Medication Dose  Route Frequency Provider Last Rate Last Admin   0.9 %  sodium chloride infusion   Intravenous Once Corcoran, Melissa C, MD       0.9 %  sodium chloride infusion   Intravenous Continuous Nolon Stalls C, MD 10 mL/hr at 12/31/19 1000 New Bag at 10/05/20 1045   heparin lock flush 100 unit/mL  500 Units Intravenous Once Lequita Asal, MD        Performance status (ECOG): 1  Vitals Blood pressure 132/62, pulse 64, temperature 97.8 F (36.6 C), resp. rate 20, weight 268 lb 4.8 oz (121.7 kg), SpO2 98 %.   Physical Exam Vitals and nursing note reviewed.  Constitutional:      General: He is not in acute distress.    Appearance: He is well-developed. He is obese. He is not diaphoretic.     Interventions: Face mask in place.     Comments: Patient walks with a cane  HENT:     Head: Normocephalic and atraumatic.     Mouth/Throat:     Mouth: Mucous membranes are moist.     Pharynx: Oropharynx is clear.  Eyes:     General: No scleral icterus.    Extraocular Movements: Extraocular  movements intact.     Conjunctiva/sclera: Conjunctivae normal.     Pupils: Pupils are equal, round, and reactive to light.  Cardiovascular:     Rate and Rhythm: Normal rate and regular rhythm.     Heart sounds: Normal heart sounds. No murmur heard. Pulmonary:     Effort: Pulmonary effort is normal. No respiratory distress.     Breath sounds: No wheezing or rales.     Comments: Decreased breath sound bilaterally Chest:     Chest wall: No tenderness.  Breasts:    Right: No axillary adenopathy or supraclavicular adenopathy.     Left: No axillary adenopathy or supraclavicular adenopathy.  Abdominal:     General: Bowel sounds are normal. There is no distension.     Palpations: Abdomen is soft. There is no mass.     Tenderness: There is no abdominal tenderness. There is no guarding or rebound.  Musculoskeletal:        General: No swelling or tenderness. Normal range of motion.     Cervical back: Normal range of motion and neck supple.     Right lower leg: Edema present.     Left lower leg: Edema present.  Lymphadenopathy:     Head:     Right side of head: No preauricular, posterior auricular or occipital adenopathy.     Left side of head: No preauricular, posterior auricular or occipital adenopathy.     Cervical: No cervical adenopathy.     Upper Body:     Right upper body: No supraclavicular or axillary adenopathy.     Left upper body: No supraclavicular or axillary adenopathy.     Lower Body: No right inguinal adenopathy. No left inguinal adenopathy.  Skin:    General: Skin is warm and dry.  Neurological:     General: No focal deficit present.     Mental Status: He is alert and oriented to person, place, and time. Mental status is at baseline.  Psychiatric:        Mood and Affect: Mood normal.   Infusion on 12/02/2020  Component Date Value Ref Range Status   Sodium 12/02/2020 135  135 - 145 mmol/L Final   Potassium 12/02/2020 3.6  3.5 - 5.1 mmol/L Final   Chloride 12/02/2020  101  98 - 111 mmol/L Final   CO2 12/02/2020 27  22 - 32 mmol/L Final   Glucose, Bld 12/02/2020 145 (A) 70 - 99 mg/dL Final   Glucose reference range applies only to samples taken after fasting for at least 8 hours.   BUN 12/02/2020 10  8 - 23 mg/dL Final   Creatinine, Ser 12/02/2020 0.76  0.61 - 1.24 mg/dL Final   Calcium 12/02/2020 9.1  8.9 - 10.3 mg/dL Final   Total Protein 12/02/2020 6.9  6.5 - 8.1 g/dL Final   Albumin 12/02/2020 3.6  3.5 - 5.0 g/dL Final   AST 12/02/2020 33  15 - 41 U/L Final   ALT 12/02/2020 27  0 - 44 U/L Final   Alkaline Phosphatase 12/02/2020 58  38 - 126 U/L Final   Total Bilirubin 12/02/2020 0.9  0.3 - 1.2 mg/dL Final   GFR, Estimated 12/02/2020 >60  >60 mL/min Final   Comment: (NOTE) Calculated using the CKD-EPI Creatinine Equation (2021)    Anion gap 12/02/2020 7  5 - 15 Final   Performed at [facility], [address], [place], [place] [address]   WBC 12/02/2020 6.2  4.0 - 10.5 K/uL Final   RBC 12/02/2020 4.09 (A) 4.22 - 5.81 MIL/uL Final   Hemoglobin 12/02/2020 12.9 (A) 13.0 - 17.0 g/dL Final   HCT 12/02/2020 37.3 (A) 39.0 - 52.0 % Final   MCV 12/02/2020 91.2  80.0 - 100.0 fL Final   MCH 12/02/2020 31.5  26.0 - 34.0 pg Final   MCHC 12/02/2020 34.6  30.0 - 36.0 g/dL Final   RDW 12/02/2020 11.9  11.5 - 15.5 % Final   Platelets 12/02/2020 194  150 - 400 K/uL Final   nRBC 12/02/2020 0.0  0.0 - 0.2 % Final   Neutrophils Relative % 12/02/2020 67  % Final   Neutro Abs 12/02/2020 4.2  1.7 - 7.7 K/uL Final   Lymphocytes Relative 12/02/2020 27  % Final   Lymphs Abs 12/02/2020 1.7  0.7 - 4.0 K/uL Final   Monocytes Relative 12/02/2020 3  % Final   Monocytes Absolute 12/02/2020 0.2  0.1 - 1.0 K/uL Final   Eosinophils Relative 12/02/2020 2  % Final   Eosinophils Absolute 12/02/2020 0.1  0.0 - 0.5 K/uL Final   Basophils Relative 12/02/2020 1  % Final   Basophils Absolute 12/02/2020 0.0  0.0 - 0.1 K/uL Final   Immature Granulocytes 12/02/2020 0  %  Final   Abs Immature Granulocytes 12/02/2020 0.01  0.00 - 0.07 K/uL Final   Performed at [facility], [address], [place], [place] [address]    Assessment and plan 1. Colon cancer metastasized to multiple sites ([place])   2. Chemotherapy-induced neuropathy ([place])   3. Anemia due to chemotherapy   4. Other constipation    #Metastatic colon cancer-recurrent Clinically patient is doing well.  He tolerated 5-FU/bevacizumab treatments. Labs reviewed and discussed with patient and wife.  # Chemotherapy induced neuropathy Symptoms are worse.  Recommend patient to start gabapentin 100 mg once daily, if he tolerates, he may titrate dosage up to 100 mg 3 times a day according to his symptoms.  Potential side effects were discussed with both patient and wife and they agreed with the treatment plan.  #Chemotherapy-induced anemia, hemoglobin slightly decreased.  Monitor. Follow-up 1 week lab MD 5-FU/bevacizumab at [facility] # Constipation, recommend patient to take Colace 14m -2058mdaily.   I discussed the assessment and treatment plan with the patient.  The patient was provided an opportunity to ask questions and all were answered.  The patient agreed with the plan and demonstrated an understanding of the instructions.  The patient was advised to call back if the symptoms worsen or if the condition fails to improve as anticipated.  [name], MD, PhD Hematology Oncology [place] at [facility] 12/02/2020

## 2020-12-14 ENCOUNTER — Inpatient Hospital Stay: Payer: Medicare Other

## 2020-12-14 ENCOUNTER — Inpatient Hospital Stay (HOSPITAL_BASED_OUTPATIENT_CLINIC_OR_DEPARTMENT_OTHER): Payer: Medicare Other | Admitting: Oncology

## 2020-12-14 ENCOUNTER — Encounter: Payer: Self-pay | Admitting: Oncology

## 2020-12-14 ENCOUNTER — Other Ambulatory Visit: Payer: Self-pay

## 2020-12-14 ENCOUNTER — Inpatient Hospital Stay: Payer: Medicare Other | Attending: Oncology

## 2020-12-14 ENCOUNTER — Ambulatory Visit: Payer: Medicare Other

## 2020-12-14 VITALS — BP 127/80 | HR 68 | Temp 97.9°F | Resp 18 | Wt 270.3 lb

## 2020-12-14 DIAGNOSIS — K5900 Constipation, unspecified: Secondary | ICD-10-CM | POA: Diagnosis not present

## 2020-12-14 DIAGNOSIS — Z803 Family history of malignant neoplasm of breast: Secondary | ICD-10-CM | POA: Insufficient documentation

## 2020-12-14 DIAGNOSIS — C786 Secondary malignant neoplasm of retroperitoneum and peritoneum: Secondary | ICD-10-CM

## 2020-12-14 DIAGNOSIS — T451X5D Adverse effect of antineoplastic and immunosuppressive drugs, subsequent encounter: Secondary | ICD-10-CM | POA: Diagnosis not present

## 2020-12-14 DIAGNOSIS — D6481 Anemia due to antineoplastic chemotherapy: Secondary | ICD-10-CM

## 2020-12-14 DIAGNOSIS — C189 Malignant neoplasm of colon, unspecified: Secondary | ICD-10-CM | POA: Diagnosis not present

## 2020-12-14 DIAGNOSIS — Z79899 Other long term (current) drug therapy: Secondary | ICD-10-CM | POA: Insufficient documentation

## 2020-12-14 DIAGNOSIS — Z5111 Encounter for antineoplastic chemotherapy: Secondary | ICD-10-CM | POA: Diagnosis present

## 2020-12-14 DIAGNOSIS — C184 Malignant neoplasm of transverse colon: Secondary | ICD-10-CM | POA: Diagnosis present

## 2020-12-14 DIAGNOSIS — Z836 Family history of other diseases of the respiratory system: Secondary | ICD-10-CM | POA: Insufficient documentation

## 2020-12-14 DIAGNOSIS — Z5112 Encounter for antineoplastic immunotherapy: Secondary | ICD-10-CM | POA: Diagnosis present

## 2020-12-14 DIAGNOSIS — Z86010 Personal history of colonic polyps: Secondary | ICD-10-CM | POA: Insufficient documentation

## 2020-12-14 DIAGNOSIS — G620 Drug-induced polyneuropathy: Secondary | ICD-10-CM | POA: Insufficient documentation

## 2020-12-14 DIAGNOSIS — Z85038 Personal history of other malignant neoplasm of large intestine: Secondary | ICD-10-CM

## 2020-12-14 DIAGNOSIS — Z809 Family history of malignant neoplasm, unspecified: Secondary | ICD-10-CM | POA: Diagnosis not present

## 2020-12-14 DIAGNOSIS — K5909 Other constipation: Secondary | ICD-10-CM

## 2020-12-14 LAB — COMPREHENSIVE METABOLIC PANEL
ALT: 35 U/L (ref 0–44)
AST: 39 U/L (ref 15–41)
Albumin: 3.7 g/dL (ref 3.5–5.0)
Alkaline Phosphatase: 57 U/L (ref 38–126)
Anion gap: 7 (ref 5–15)
BUN: 12 mg/dL (ref 8–23)
CO2: 28 mmol/L (ref 22–32)
Calcium: 9.2 mg/dL (ref 8.9–10.3)
Chloride: 101 mmol/L (ref 98–111)
Creatinine, Ser: 0.7 mg/dL (ref 0.61–1.24)
GFR, Estimated: >60 mL/min (ref >60)
Glucose, Bld: 92 mg/dL (ref 70–99)
Potassium: 3.7 mmol/L (ref 3.5–5.1)
Sodium: 136 mmol/L (ref 135–145)
Total Bilirubin: 1 mg/dL (ref 0.3–1.2)
Total Protein: 6.9 g/dL (ref 6.5–8.1)

## 2020-12-14 LAB — CBC WITH DIFFERENTIAL/PLATELET
Abs Immature Granulocytes: 0.02 10*3/uL (ref 0.00–0.07)
Basophils Absolute: 0.1 10*3/uL (ref 0.0–0.1)
Basophils Relative: 1 %
Eosinophils Absolute: 0.1 10*3/uL (ref 0.0–0.5)
Eosinophils Relative: 3 %
HCT: 38.3 % — ABNORMAL LOW (ref 39.0–52.0)
Hemoglobin: 12.9 g/dL — ABNORMAL LOW (ref 13.0–17.0)
Immature Granulocytes: 0 %
Lymphocytes Relative: 33 %
Lymphs Abs: 1.6 10*3/uL (ref 0.7–4.0)
MCH: 31.9 pg (ref 26.0–34.0)
MCHC: 33.7 g/dL (ref 30.0–36.0)
MCV: 94.6 fL (ref 80.0–100.0)
Monocytes Absolute: 0.5 10*3/uL (ref 0.1–1.0)
Monocytes Relative: 10 %
Neutro Abs: 2.6 10*3/uL (ref 1.7–7.7)
Neutrophils Relative %: 53 %
Platelets: 197 10*3/uL (ref 150–400)
RBC: 4.05 MIL/uL — ABNORMAL LOW (ref 4.22–5.81)
RDW: 12.7 % (ref 11.5–15.5)
WBC: 5 10*3/uL (ref 4.0–10.5)
nRBC: 0 % (ref 0.0–0.2)

## 2020-12-14 LAB — PROTEIN, URINE, RANDOM: Total Protein, Urine: <6 mg/dL

## 2020-12-14 IMAGING — CR DG CHEST 2V
1 series · 2 of 2 positions shown · non-contrast
Comparison: 06/13/2019

CLINICAL DATA: Hypotension and dizziness

EXAM:
CHEST - 2 VIEW

[Series 1: dg chest 2 view · 0.14mm/px · 2 of 2 slices shown]
[im 1/2]
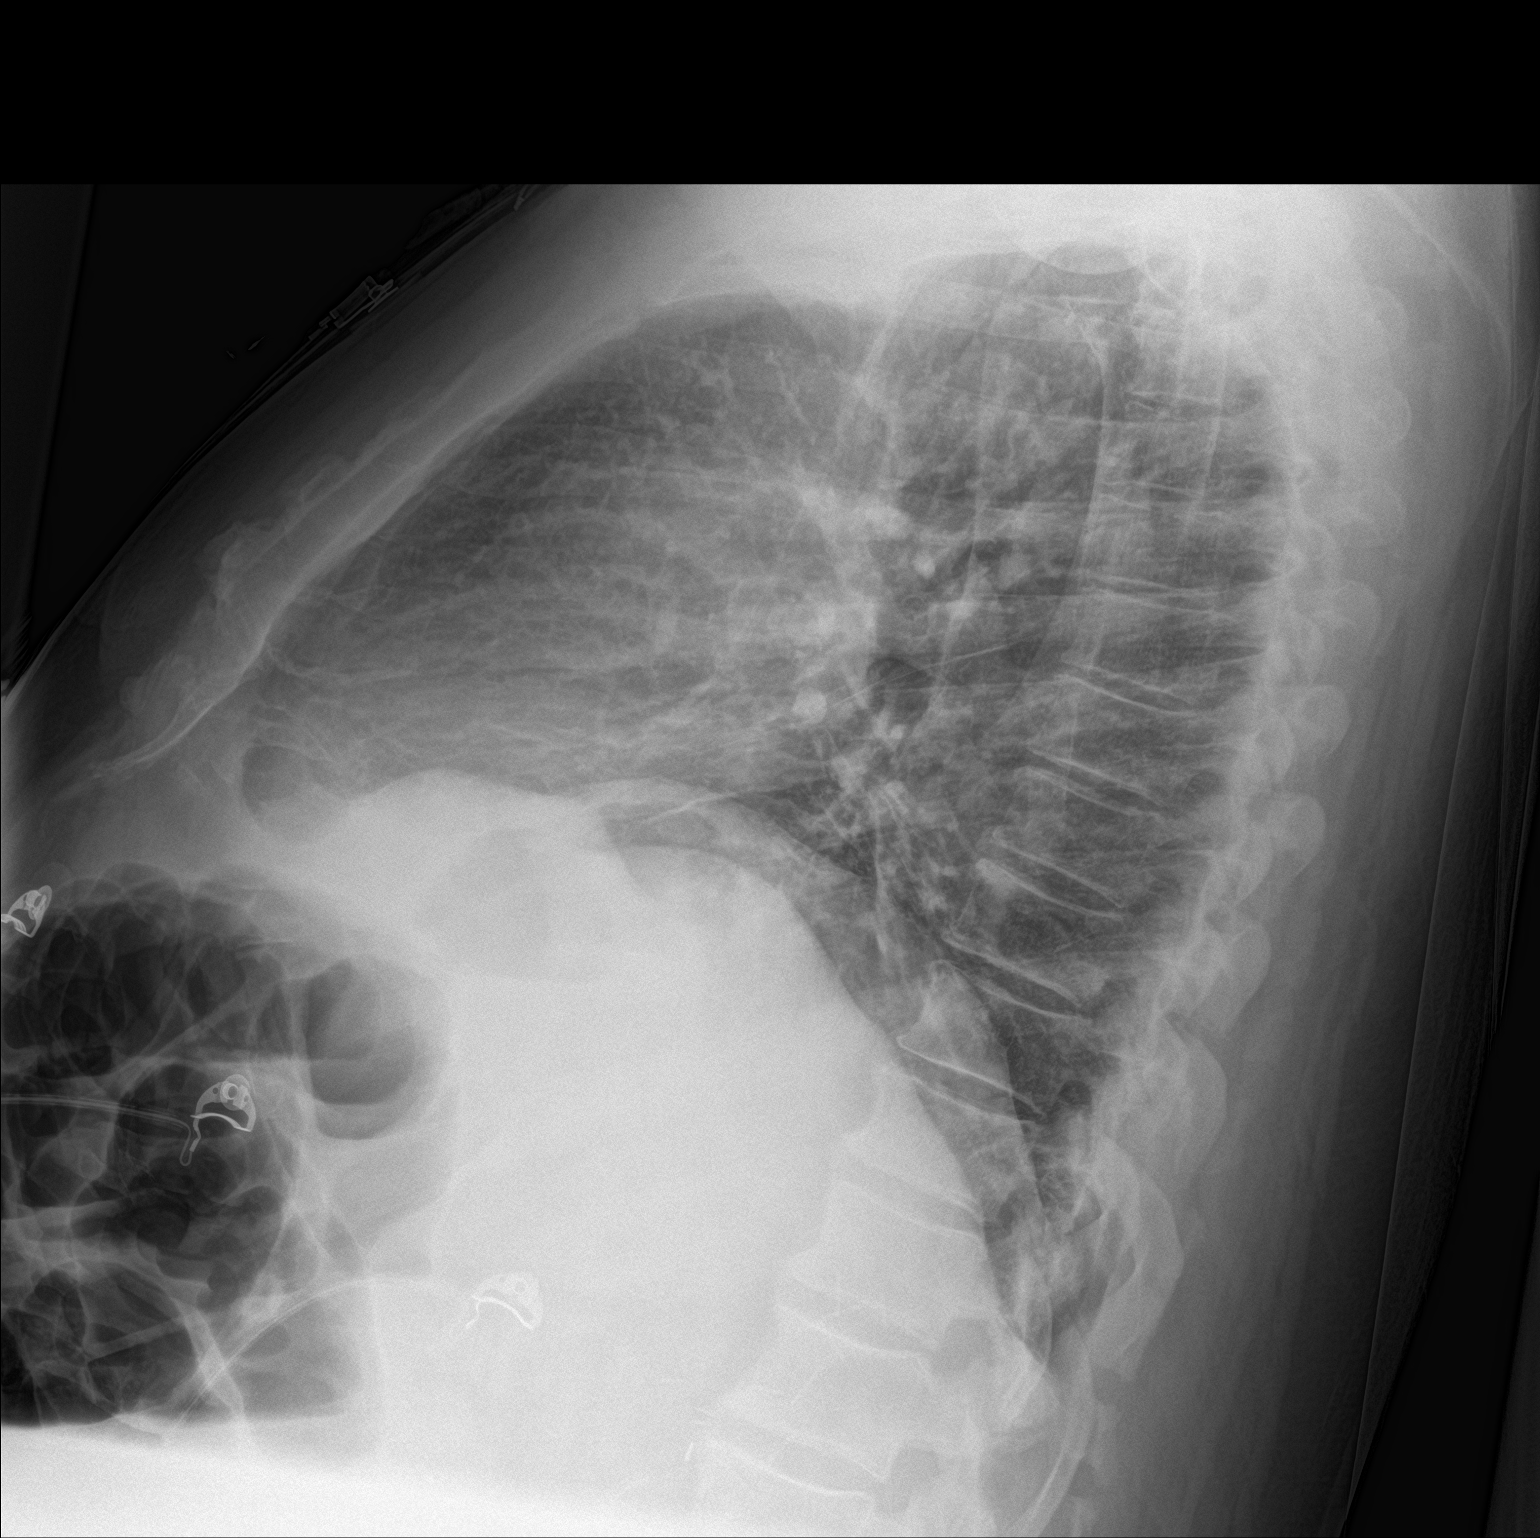
[im 2/2]
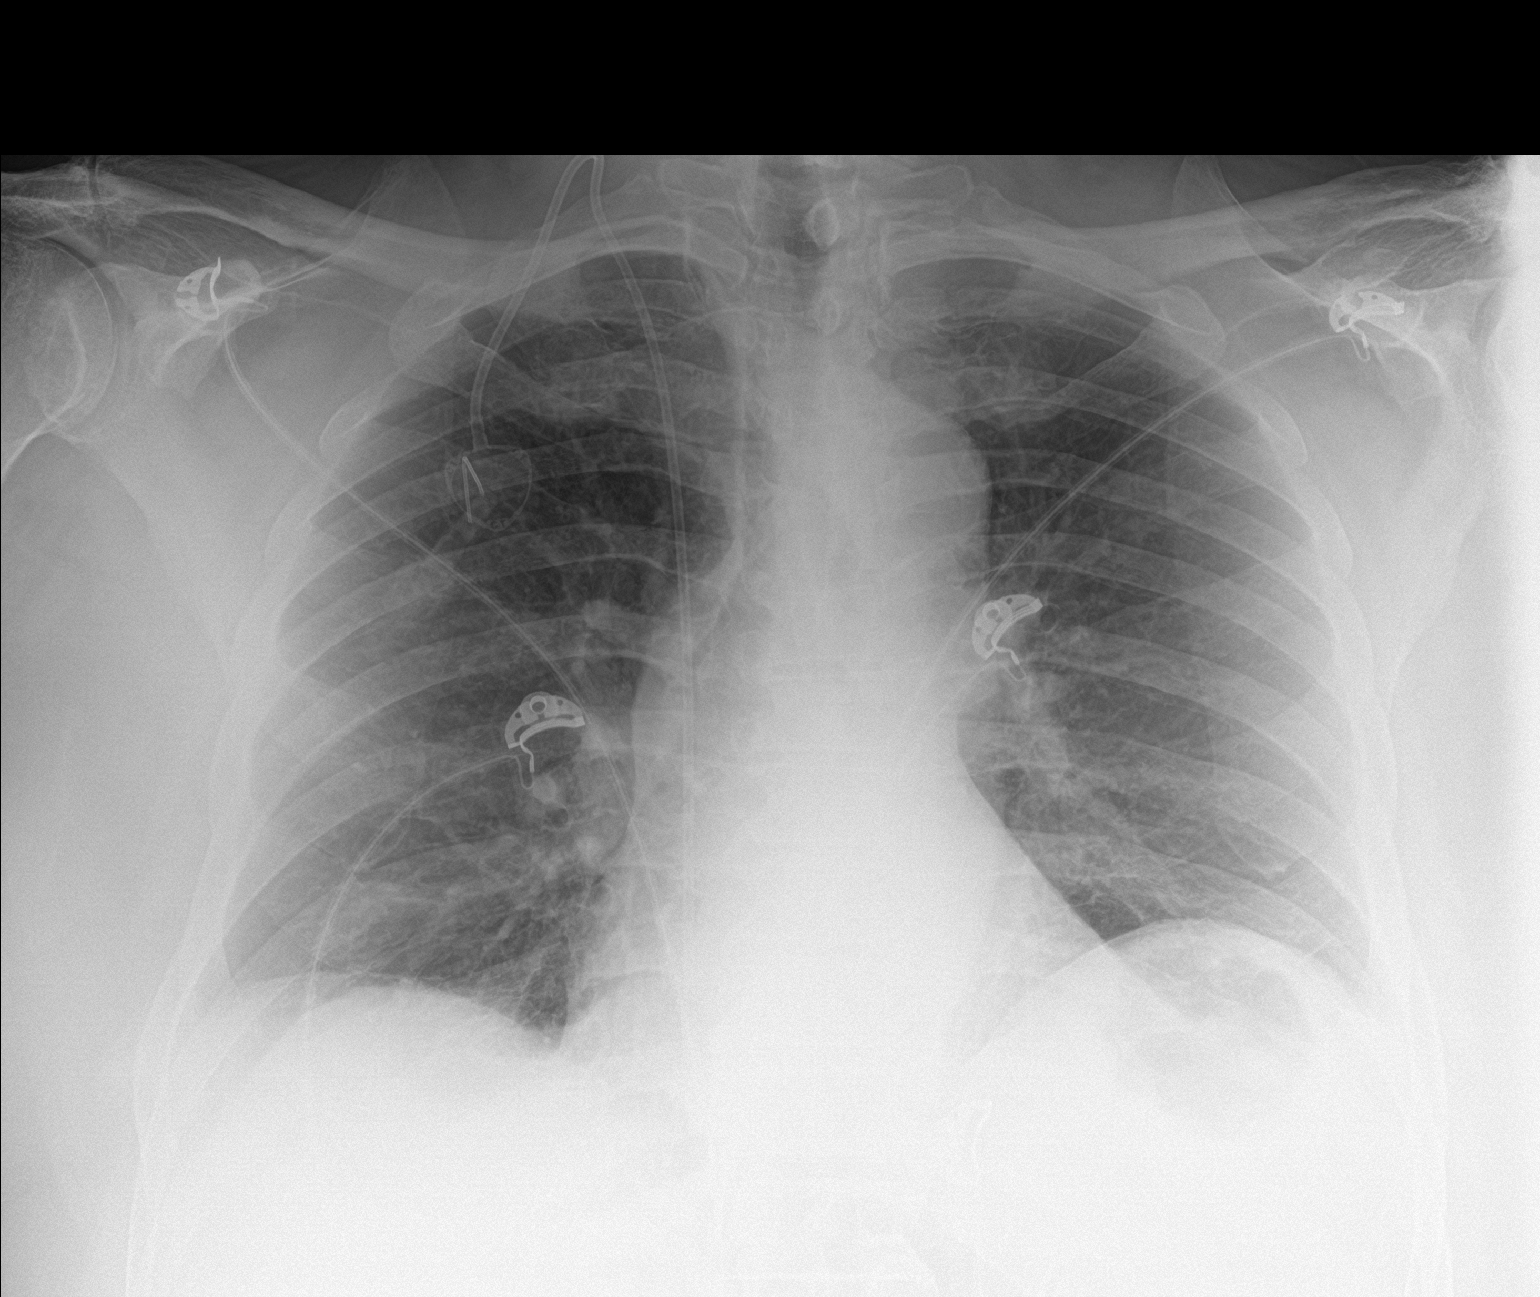

[2 of 2 positions shown; findings below may reference images not displayed]

FINDINGS: Cardiac shadows within normal limits. Aortic calcifications are
again seen. Right-sided chest wall port is noted in satisfactory
position. The lungs are well aerated bilaterally. No effusion or
pneumothorax is noted. No acute bony abnormality is noted.
IMPRESSION: No acute abnormality seen.

## 2020-12-14 IMAGING — US US EXTREM LOW VENOUS
1 series · 13 of 24 positions shown · non-contrast
Comparison: August 16, 2019.

CLINICAL DATA: Left leg swelling and redness for the past week.



[Series 1: us venous img lower bilat (dvt) · portal-venous · 13 of 65 slices shown]
[im 1/65]
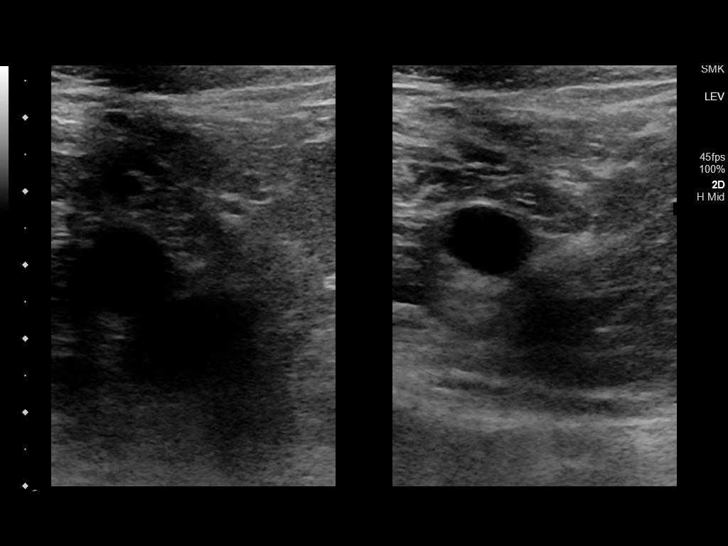
[im 6/65]
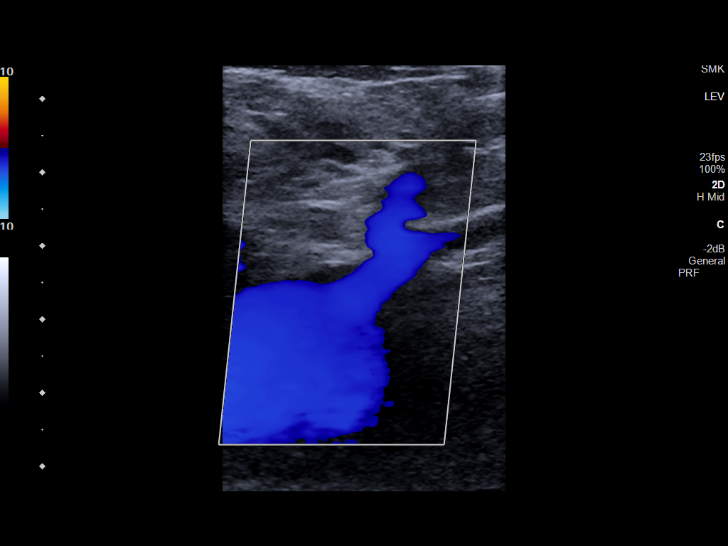
[im 12/65]
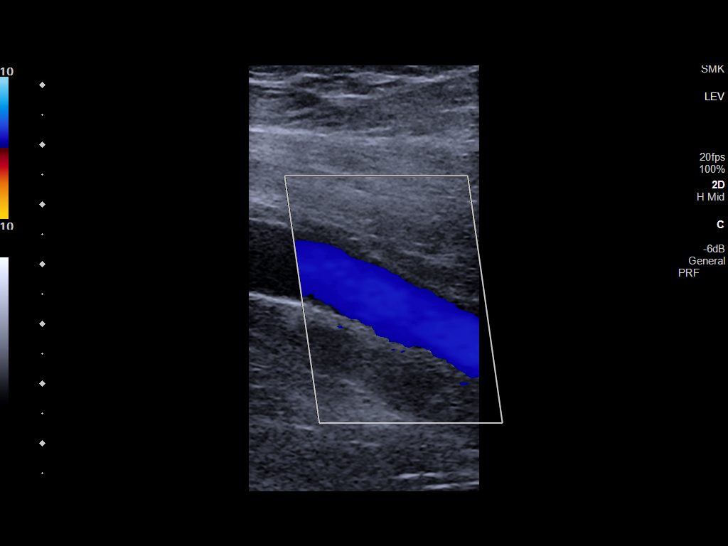
[im 17/65]
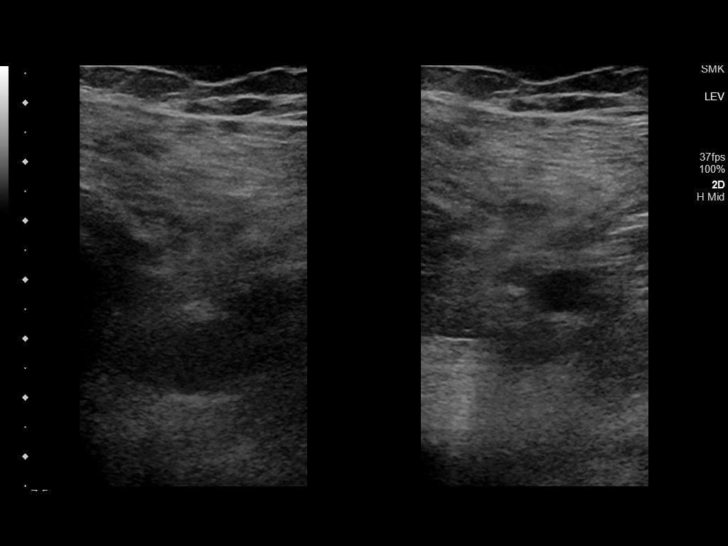
[im 23/65]
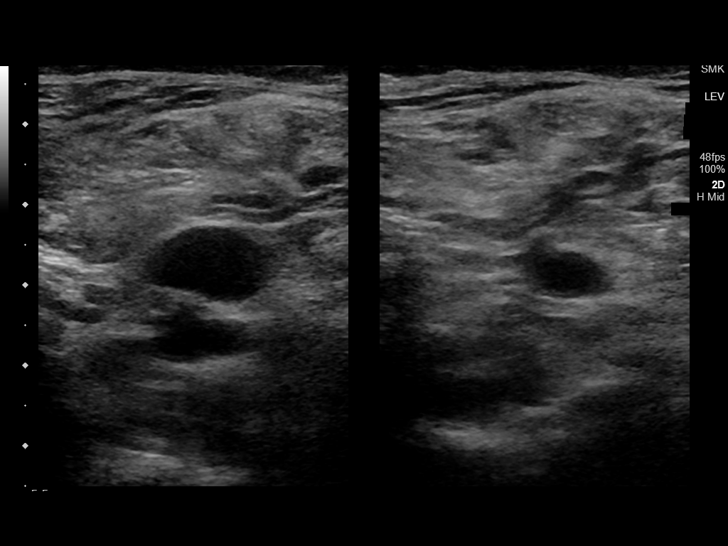
[im 28/65]
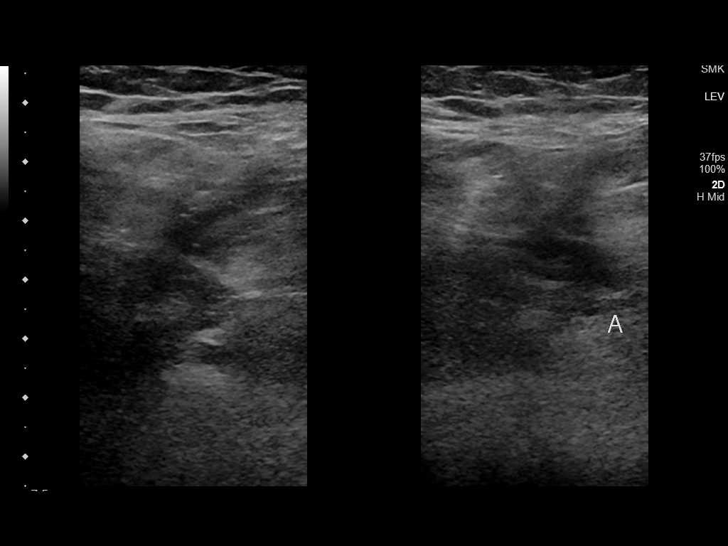
[im 34/65]
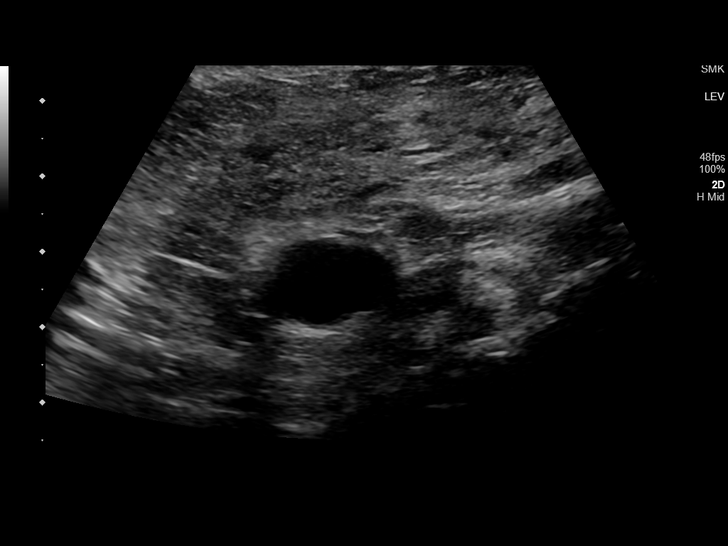
[im 37/65]
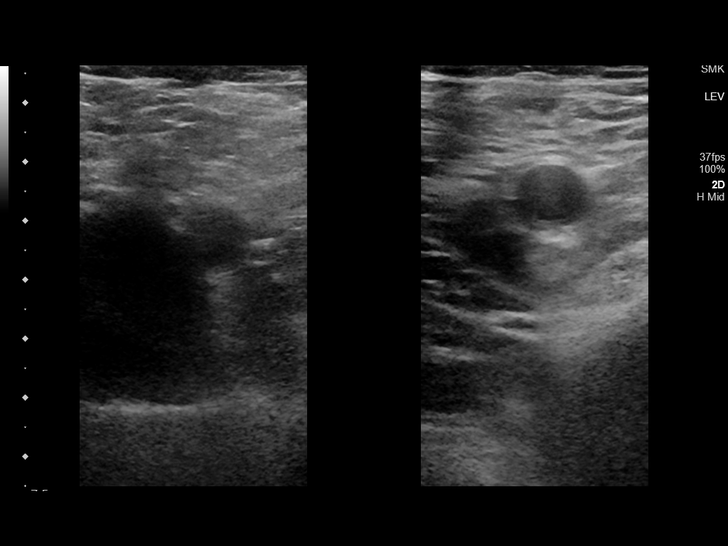
[im 42/65]
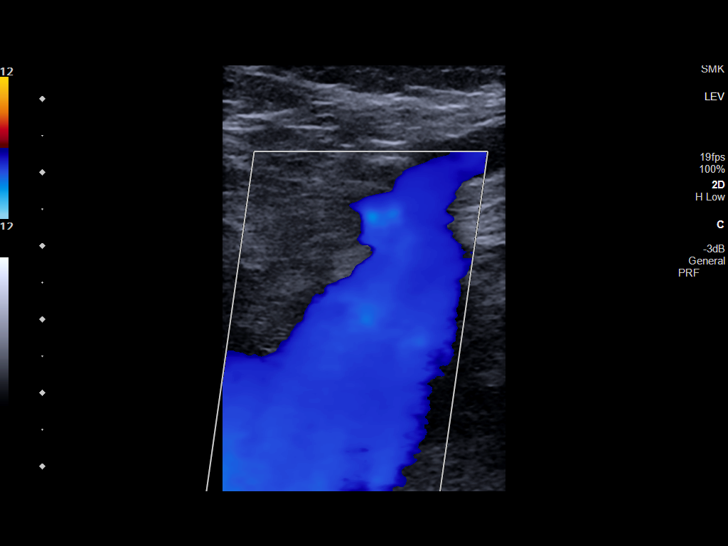
[im 48/65]
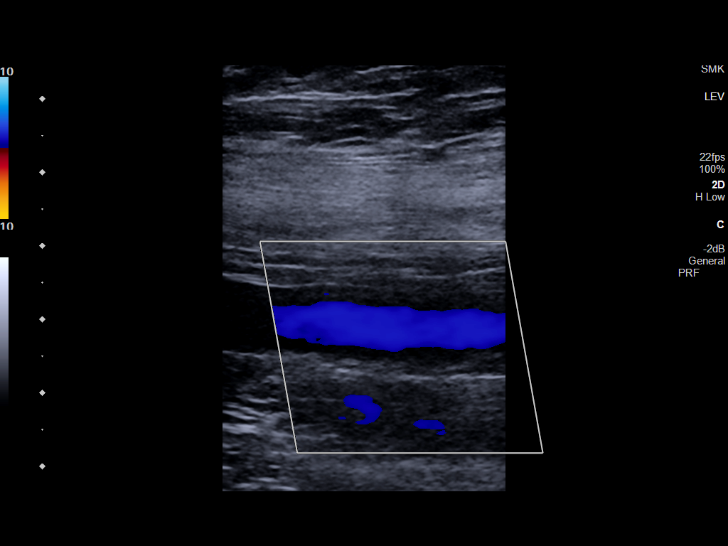
[im 53/65]
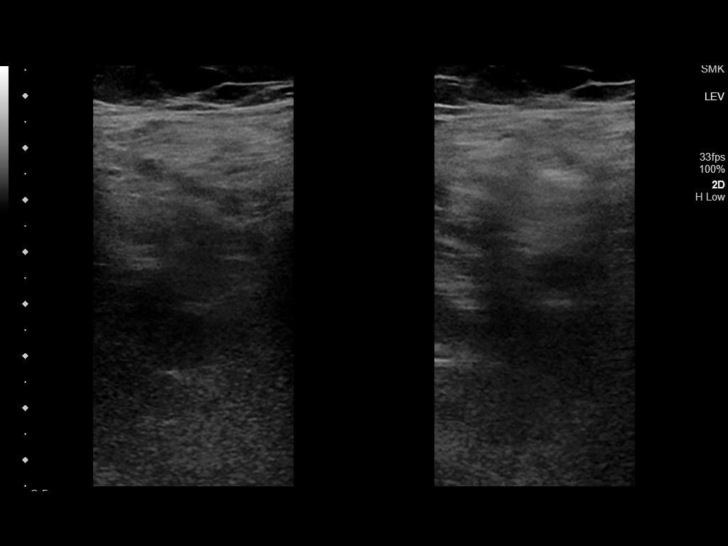
[im 59/65]
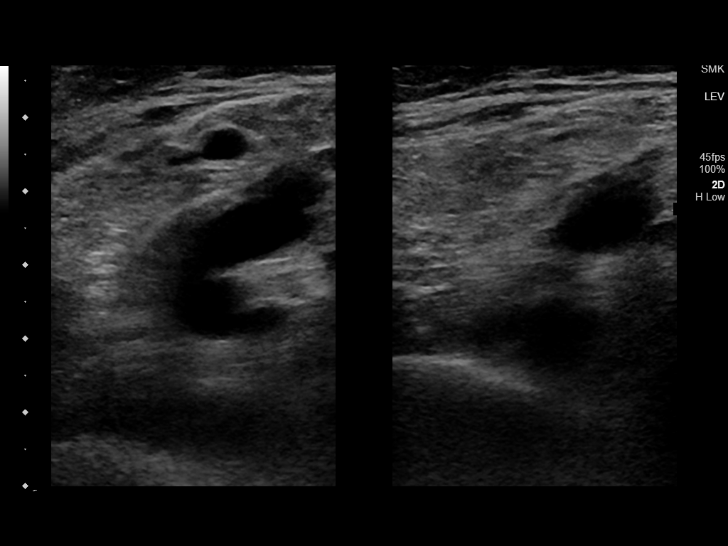
[im 65/65]
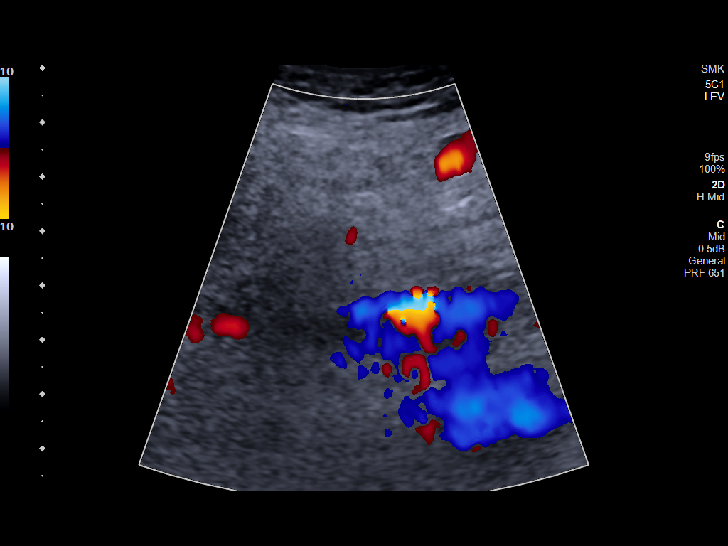

[13 of 24 positions shown; findings below may reference images not displayed]

FINDINGS: RIGHT LOWER EXTREMITY

Common Femoral Vein: No evidence of thrombus. Normal
compressibility, respiratory phasicity and response to augmentation.

Saphenofemoral Junction: No evidence of thrombus. Normal
compressibility and flow on color Doppler imaging.

Profunda Femoral Vein: No evidence of thrombus. Normal
compressibility and flow on color Doppler imaging.

Femoral Vein: No evidence of thrombus. Normal compressibility,
respiratory phasicity and response to augmentation.

Popliteal Vein: No evidence of thrombus. Normal compressibility,
respiratory phasicity and response to augmentation.

Calf Veins: No evidence of thrombus within the posterior tibial
vein. The peroneal vein is not well evaluated due to edema. Normal
compressibility and flow on color Doppler imaging.

Superficial Great Saphenous Vein: No evidence of thrombus. Normal
compressibility.

Venous Reflux:  None.

Other Findings:  Unchanged 2.0 cm small Baker cyst.

LEFT LOWER EXTREMITY

Common Femoral Vein: No evidence of thrombus. Normal
compressibility, respiratory phasicity and response to augmentation.

Saphenofemoral Junction: No evidence of thrombus. Normal
compressibility and flow on color Doppler imaging.

Profunda Femoral Vein: No evidence of thrombus. Normal
compressibility and flow on color Doppler imaging.

Femoral Vein: No evidence of thrombus. Normal compressibility,
respiratory phasicity and response to augmentation.

Popliteal Vein: No evidence of thrombus. Normal compressibility,
respiratory phasicity and response to augmentation.

Calf Veins: Not well evaluated due to edema.

Superficial Great Saphenous Vein: No evidence of thrombus. Normal
compressibility.

Venous Reflux:  None.

Other Findings:  None.
IMPRESSION: No evidence of deep venous thrombosis in either lower extremity,
although there is limited evaluation of the bilateral calf veins due
to soft tissue edema.

## 2020-12-14 MED ORDER — FLUOROURACIL CHEMO INJECTION 2.5 GM/50ML
400.0000 mg/m2 | Freq: Once | INTRAVENOUS | Status: AC
  Administered 2020-12-14: 950 mg via INTRAVENOUS
  Filled 2020-12-14: qty 19

## 2020-12-14 MED ORDER — PALONOSETRON HCL INJECTION 0.25 MG/5ML
0.2500 mg | Freq: Once | INTRAVENOUS | Status: AC
  Administered 2020-12-14: 0.25 mg via INTRAVENOUS
  Filled 2020-12-14: qty 5

## 2020-12-14 MED ORDER — SODIUM CHLORIDE 0.9 % IV SOLN
Freq: Once | INTRAVENOUS | Status: AC
  Filled 2020-12-14: qty 250

## 2020-12-14 MED ORDER — SODIUM CHLORIDE 0.9 % IV SOLN
5.0000 mg/kg | Freq: Once | INTRAVENOUS | Status: AC
  Administered 2020-12-14: 600 mg via INTRAVENOUS
  Filled 2020-12-14: qty 16

## 2020-12-14 MED ORDER — SODIUM CHLORIDE 0.9 % IV SOLN
2400.0000 mg/m2 | INTRAVENOUS | Status: DC
  Administered 2020-12-14: 5800 mg via INTRAVENOUS
  Filled 2020-12-14: qty 116

## 2020-12-14 MED ORDER — SODIUM CHLORIDE 0.9 % IV SOLN
10.0000 mg | Freq: Once | INTRAVENOUS | Status: AC
  Administered 2020-12-14: 10 mg via INTRAVENOUS
  Filled 2020-12-14: qty 10

## 2020-12-14 MED ORDER — LEUCOVORIN CALCIUM INJECTION 350 MG
1000.0000 mg | Freq: Once | INTRAMUSCULAR | Status: AC
  Administered 2020-12-14: 1000 mg via INTRAVENOUS
  Filled 2020-12-14: qty 50

## 2020-12-14 NOTE — Patient Instructions (Signed)
[facility]  Discharge Instructions: Thank you for choosing [facility] to provide your oncology and hematology care.  If you have a lab appointment with the [facility], please go directly to the [facility] and check in at the registration area.  Wear comfortable clothing and clothing appropriate for easy access to any Portacath or PICC line.   We strive to give you quality time with your provider. You may need to reschedule your appointment if you arrive late (15 or more minutes).  Arriving late affects you and other patients whose appointments are after yours.  Also, if you miss three or more appointments without notifying the office, you may be dismissed from the clinic at the provider's discretion.      For prescription refill requests, have your pharmacy contact our office and allow 72 hours for refills to be completed.    Today you received the following chemotherapy and/or immunotherapy agents ZIRABEV, LEUCOVORIN, 5FU      To help prevent nausea and vomiting after your treatment, we encourage you to take your nausea medication as directed.  BELOW ARE SYMPTOMS THAT SHOULD BE REPORTED IMMEDIATELY: *FEVER GREATER THAN 100.4 F (38 C) OR HIGHER *CHILLS OR SWEATING *NAUSEA AND VOMITING THAT IS NOT CONTROLLED WITH YOUR NAUSEA MEDICATION *UNUSUAL SHORTNESS OF BREATH *UNUSUAL BRUISING OR BLEEDING *URINARY PROBLEMS (pain or burning when urinating, or frequent urination) *BOWEL PROBLEMS (unusual diarrhea, constipation, pain near the anus) TENDERNESS IN MOUTH AND THROAT WITH OR WITHOUT PRESENCE OF ULCERS (sore throat, sores in mouth, or a toothache) UNUSUAL RASH, SWELLING OR PAIN  UNUSUAL VAGINAL DISCHARGE OR ITCHING   Items with * indicate a potential emergency and should be followed up as soon as possible or go to the Emergency Department if any problems should occur.  Please show the CHEMOTHERAPY ALERT CARD or IMMUNOTHERAPY ALERT  CARD at check-in to the Emergency Department and triage nurse.  Should you have questions after your visit or need to cancel or reschedule your appointment, please contact [facility]  [phone] and follow the prompts.  Office hours are 8:00 a.m. to 4:30 p.m. Monday - Friday. Please note that voicemails left after 4:00 p.m. may not be returned until the following business day.  We are closed weekends and major holidays. You have access to a nurse at all times for urgent questions. Please call the main number to the clinic [phone] and follow the prompts.  For any non-urgent questions, you may also contact your provider using MyChart. We now offer e-Visits for anyone 78 and older to request care online for non-urgent symptoms. For details visit [email or web address].   Also download the MyChart app! Go to the app store, search "MyChart", open the app, select [facility], and log in with your MyChart username and password.  Due to Covid, a mask is required upon entering the hospital/clinic. If you do not have a mask, one will be given to you upon arrival. For doctor visits, patients may have 1 support person aged 66 or older with them. For treatment visits, patients cannot have anyone with them due to current Covid guidelines and our immunocompromised population.   Bevacizumab injection What is this medication? BEVACIZUMAB (be va SIZ yoo mab) is a monoclonal antibody. It is used to treatmany types of cancer. This medicine may be used for other purposes; ask your health care provider orpharmacist if you have questions. COMMON BRAND NAME(S): Avastin, MVASI, Zirabev What should I tell my  care team before I take this medication? They need to know if you have any of these conditions: diabetes heart disease high blood pressure history of coughing up blood prior anthracycline chemotherapy (e.g., doxorubicin, daunorubicin, epirubicin) recent or ongoing  radiation therapy recent or planning to have surgery stroke an unusual or allergic reaction to bevacizumab, hamster proteins, mouse proteins, other medicines, foods, dyes, or preservatives pregnant or trying to get pregnant breast-feeding How should I use this medication? This medicine is for infusion into a vein. It is given by a health careprofessional in a hospital or clinic setting. Talk to your pediatrician regarding the use of this medicine in children.Special care may be needed. Overdosage: If you think you have taken too much of this medicine contact apoison control center or emergency room at once. NOTE: This medicine is only for you. Do not share this medicine with others. What if I miss a dose? It is important not to miss your dose. Call your doctor or health careprofessional if you are unable to keep an appointment. What may interact with this medication? Interactions are not expected. This list may not describe all possible interactions. Give your health care provider a list of all the medicines, herbs, non-prescription drugs, or dietary supplements you use. Also tell them if you smoke, drink alcohol, or use illegaldrugs. Some items may interact with your medicine. What should I watch for while using this medication? Your condition will be monitored carefully while you are receiving this medicine. You will need important blood work and urine testing done while youare taking this medicine. This medicine may increase your risk to bruise or bleed. Call your doctor orhealth care professional if you notice any unusual bleeding. Before having surgery, talk to your health care provider to make sure it is ok. This drug can increase the risk of poor healing of your surgical site or wound. You will need to stop this drug for 28 days before surgery. After surgery, wait at least 28 days before restarting this drug. Make sure the surgical site or wound is healed enough before restarting this drug.  Talk to your health careprovider if questions. Do not become pregnant while taking this medicine or for 6 months after stopping it. Women should inform their doctor if they wish to become pregnant or think they might be pregnant. There is a potential for serious side effects to an unborn child. Talk to your health care professional or pharmacist for more information. Do not breast-feed an infant while taking this medicine andfor 6 months after the last dose. This medicine has caused ovarian failure in some women. This medicine may interfere with the ability to have a child. You should talk to your doctor orhealth care professional if you are concerned about your fertility. What side effects may I notice from receiving this medication? Side effects that you should report to your doctor or health care professionalas soon as possible: allergic reactions like skin rash, itching or hives, swelling of the face, lips, or tongue chest pain or chest tightness chills coughing up blood high fever seizures severe constipation signs and symptoms of bleeding such as bloody or black, tarry stools; red or dark-brown urine; spitting up blood or brown material that looks like coffee grounds; red spots on the skin; unusual bruising or bleeding from the eye, gums, or nose signs and symptoms of a blood clot such as breathing problems; chest pain; severe, sudden headache; pain, swelling, warmth in the leg signs and symptoms of a stroke like  changes in vision; confusion; trouble speaking or understanding; severe headaches; sudden numbness or weakness of the face, arm or leg; trouble walking; dizziness; loss of balance or coordination stomach pain sweating swelling of legs or ankles vomiting weight gain Side effects that usually do not require medical attention (report to yourdoctor or health care professional if they continue or are bothersome): back pain changes in taste decreased appetite dry  skin nausea tiredness This list may not describe all possible side effects. Call your doctor for medical advice about side effects. You may report side effects to FDA at1-800-FDA-1088. Where should I keep my medication? This drug is given in a hospital or clinic and will not be stored at home. NOTE: This sheet is a summary. It may not cover all possible information. If you have questions about this medicine, talk to your doctor, pharmacist, orhealth care provider.  2022 Elsevier/Gold Standard (2019-02-26 10:50:46)  Leucovorin injection What is this medication? LEUCOVORIN (loo koe VOR in) is used to prevent or treat the harmful effects of some medicines. This medicine is used to treat anemia caused by a low amount of folic acid in the body. It is also used with 5-fluorouracil (5-FU) to treatcolon cancer. This medicine may be used for other purposes; ask your health care provider orpharmacist if you have questions. What should I tell my care team before I take this medication? They need to know if you have any of these conditions: anemia from low levels of vitamin B-12 in the blood an unusual or allergic reaction to leucovorin, folic acid, other medicines, foods, dyes, or preservatives pregnant or trying to get pregnant breast-feeding How should I use this medication? This medicine is for injection into a muscle or into a vein. It is given by ahealth care professional in a hospital or clinic setting. Talk to your pediatrician regarding the use of this medicine in children.Special care may be needed. Overdosage: If you think you have taken too much of this medicine contact apoison control center or emergency room at once. NOTE: This medicine is only for you. Do not share this medicine with others. What if I miss a dose? This does not apply. What may interact with this medication? capecitabine fluorouracil phenobarbital phenytoin primidone trimethoprim-sulfamethoxazole This list may not  describe all possible interactions. Give your health care provider a list of all the medicines, herbs, non-prescription drugs, or dietary supplements you use. Also tell them if you smoke, drink alcohol, or use illegaldrugs. Some items may interact with your medicine. What should I watch for while using this medication? Your condition will be monitored carefully while you are receiving thismedicine. This medicine may increase the side effects of 5-fluorouracil, 5-FU. Tell your doctor or health care professional if you have diarrhea or mouth sores that donot get better or that get worse. What side effects may I notice from receiving this medication? Side effects that you should report to your doctor or health care professionalas soon as possible: allergic reactions like skin rash, itching or hives, swelling of the face, lips, or tongue breathing problems fever, infection mouth sores unusual bleeding or bruising unusually weak or tired Side effects that usually do not require medical attention (report to yourdoctor or health care professional if they continue or are bothersome): constipation or diarrhea loss of appetite nausea, vomiting This list may not describe all possible side effects. Call your doctor for medical advice about side effects. You may report side effects to FDA at1-800-FDA-1088. Where should I keep my medication? This  drug is given in a hospital or clinic and will not be stored at home. NOTE: This sheet is a summary. It may not cover all possible information. If you have questions about this medicine, talk to your doctor, pharmacist, orhealth care provider.  2022 Elsevier/Gold Standard (2007-11-05 16:50:29)  Fluorouracil, 5-FU injection What is this medication? FLUOROURACIL, 5-FU (flure oh YOOR a sil) is a chemotherapy drug. It slows the growth of cancer cells. This medicine is used to treat many types of cancer like breast cancer, colon or rectal cancer, pancreatic cancer,  and stomachcancer. This medicine may be used for other purposes; ask your health care provider orpharmacist if you have questions. COMMON BRAND NAME(S): Adrucil What should I tell my care team before I take this medication? They need to know if you have any of these conditions: blood disorders dihydropyrimidine dehydrogenase (DPD) deficiency infection (especially a virus infection such as chickenpox, cold sores, or herpes) kidney disease liver disease malnourished, poor nutrition recent or ongoing radiation therapy an unusual or allergic reaction to fluorouracil, other chemotherapy, other medicines, foods, dyes, or preservatives pregnant or trying to get pregnant breast-feeding How should I use this medication? This drug is given as an infusion or injection into a vein. It is administeredin a hospital or clinic by a specially trained health care professional. Talk to your pediatrician regarding the use of this medicine in children.Special care may be needed. Overdosage: If you think you have taken too much of this medicine contact apoison control center or emergency room at once. NOTE: This medicine is only for you. Do not share this medicine with others. What if I miss a dose? It is important not to miss your dose. Call your doctor or health careprofessional if you are unable to keep an appointment. What may interact with this medication? Do not take this medicine with any of the following medications: live virus vaccines This medicine may also interact with the following medications: medicines that treat or prevent blood clots like warfarin, enoxaparin, and dalteparin This list may not describe all possible interactions. Give your health care provider a list of all the medicines, herbs, non-prescription drugs, or dietary supplements you use. Also tell them if you smoke, drink alcohol, or use illegaldrugs. Some items may interact with your medicine. What should I watch for while using  this medication? Visit your doctor for checks on your progress. This drug may make you feel generally unwell. This is not uncommon, as chemotherapy can affect healthy cells as well as cancer cells. Report any side effects. Continue your course oftreatment even though you feel ill unless your doctor tells you to stop. In some cases, you may be given additional medicines to help with side effects.Follow all directions for their use. Call your doctor or health care professional for advice if you get a fever, chills or sore throat, or other symptoms of a cold or flu. Do not treat yourself. This drug decreases your body's ability to fight infections. Try toavoid being around people who are sick. This medicine may increase your risk to bruise or bleed. Call your doctor orhealth care professional if you notice any unusual bleeding. Be careful brushing and flossing your teeth or using a toothpick because you may get an infection or bleed more easily. If you have any dental work done,tell your dentist you are receiving this medicine. Avoid taking products that contain aspirin, acetaminophen, ibuprofen, naproxen, or ketoprofen unless instructed by your doctor. These medicines may hide afever. Do not become pregnant  while taking this medicine. Women should inform their doctor if they wish to become pregnant or think they might be pregnant. There is a potential for serious side effects to an unborn child. Talk to your health care professional or pharmacist for more information. Do not breast-feed aninfant while taking this medicine. Men should inform their doctor if they wish to father a child. This medicinemay lower sperm counts. Do not treat diarrhea with over the counter products. Contact your doctor ifyou have diarrhea that lasts more than 2 days or if it is severe and watery. This medicine can make you more sensitive to the sun. Keep out of the sun. If you cannot avoid being in the sun, wear protective clothing  and use sunscreen.Do not use sun lamps or tanning beds/booths. What side effects may I notice from receiving this medication? Side effects that you should report to your doctor or health care professionalas soon as possible: allergic reactions like skin rash, itching or hives, swelling of the face, lips, or tongue low blood counts - this medicine may decrease the number of white blood cells, red blood cells and platelets. You may be at increased risk for infections and bleeding. signs of infection - fever or chills, cough, sore throat, pain or difficulty passing urine signs of decreased platelets or bleeding - bruising, pinpoint red spots on the skin, black, tarry stools, blood in the urine signs of decreased red blood cells - unusually weak or tired, fainting spells, lightheadedness breathing problems changes in vision chest pain mouth sores nausea and vomiting pain, swelling, redness at site where injected pain, tingling, numbness in the hands or feet redness, swelling, or sores on hands or feet stomach pain unusual bleeding Side effects that usually do not require medical attention (report to yourdoctor or health care professional if they continue or are bothersome): changes in finger or toe nails diarrhea dry or itchy skin hair loss headache loss of appetite sensitivity of eyes to the light stomach upset unusually teary eyes This list may not describe all possible side effects. Call your doctor for medical advice about side effects. You may report side effects to FDA at1-800-FDA-1088. Where should I keep my medication? This drug is given in a hospital or clinic and will not be stored at home. NOTE: This sheet is a summary. It may not cover all possible information. If you have questions about this medicine, talk to your doctor, pharmacist, orhealth care provider.  2022 Elsevier/Gold Standard (2019-04-01 15:00:03)

## 2020-12-14 NOTE — Progress Notes (Signed)
Patient here for follow up. Pt reports he had some constipation after last treatment but it has resolved.

## 2020-12-14 NOTE — Progress Notes (Signed)
[facility]  [address], [address] [place], [place] [address] Phone: [phone]  Fax: [phone]   Clinic Day: 12/14/2020   Referring physician: Tracie Harrier, MD  Chief Complaint: Jared Gutierrez is a 80 y.o. male presents for follow-up of metastatic colon cancer   PERTINENT ONCOLOGY HISTORY Jared Gutierrez is a 80 y.o.[place] who has above oncology history reviewed by me today presented for follow up visit for management of  Metastatic colon cancer. [name], patient switched care to me on 11/11/20 Extensive medical record review was performed by me  # Metastatic colon cancer.  he was diagnosed with stage I colon cancer s/p transverse colectomy on 10/17/2013.  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  Tumor extended into the submucosa. Margins were negative. + lymphovascular invasion. 14 lymph nodes were negative. Pathologic stage was T1 N0.   10/02/2014 Colonoscopy showed several 3 mm polyps and an 8 mm polyp in the cecum and transverse colon.  Pathology revealed tubular adenomas negative for high-grade dysplasia and malignancy.  02/16/2017 Colonoscopy  revealed 2 diminutive polyps in the descending colon.  Pathology revealed tubular adenomas without dysplasia or malignancy.  Her CEA has been monitored and was noted to increase starting July 2020. 04/02/2019  PET scan  limited evealed a 2.4 x 2.3 cm (SUV 11) hypermetabolic soft tissue density caudal and anterior to the pancreatic neck favored to represent isolated peritoneal or nodal metastasis in the setting of prior transverse colonic resection (expected primary drainage). Although this was immediately adjacent to the pancreas, a fat plane was maintained, arguing strongly against a pancreatic primary. Otherwise, there was no evidence of hypermetabolic metastasis.    04/17/2019 EUS on  revealed a normal esophagus, stomach, duodenum, and pancreas.  There was a 2.4 x 2.4 cm irregular mass in the retroperitoneum adjacent to, but not involving the pancreatic neck.  FNA and core needle biopsy were performed.  Pathology revealed adenocarcinoma compatible with a metastatic lesion of colorectal origin.  Tumor cells were positive for CK20 and CDX2 and negative for CK7.   NGS: [employer] on 05/15/2019 revealed + KRASG13D and TP53.  Negative results included BRAF V600E, Her2, NRAS, NTRK, PD-L1 (<1%), and TMB 8.7/Mb (intermediate).  MMR testing from his colon resection on 10/16/2013 was intact with a low probability of MSI-H.  06/25/2019, CEA 17.8. 07/09/2019 - 08/27/2019; 09/17/2019 - 10/15/2019; 11/12/2019 - 12/17/2019 He received 11 cycles of FOLFOX chemotherapy and 1 cycle of 5-FU and leucovorin (12/31/2019).  He received Neulasta after cycle #4 and #5 secondary to progressive leukopenia.  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  He received a truncated course of FOLFOX with cycle #11 secondary to oxaliplatin reaction.  08/27/2019, CEA 6.1 10/29/2019, CEA 7.7 12/31/2019, CEA 10.8.  01/14/2020 PET revealed an interval decrease in size and FDG uptake (2.4 x 2.3 cm with SUV 11 to 2.4 x 1.7 cm with SUV 4.09) associated with the previously referenced soft tissue density caudal and anterior to the pancreatic neck suggesting treatment response. There were no new sites of FDG avid tumor.  Radiation treatment 02/10/2020 - 02/23/2020.   He received a hybrid 3-dimensional course of 3000 cGy in 10 fractions to the residual FDG avid mass   05/27/2020, CEA 11.6 05/27/2020 Abdomen and pelvis CT :stable to minimally increased (2.9 x 1.8 cm compared to 2.4 x 1.7 cm) since 01/14/2020.  There were no new interval findings.  08/31/2020, CEA 30.9. 08/31/2020 Abdomen and pelvis CT revealed increased size of the index soft tissue lesion inferior and anterior to the pancreatic neck  (2.9 x 1.8 cm to 3.5 x 2.9 cm). There were similar prominent retroperitoneal lymph nodes without adenopathy by size criteria. There was no new or enlarging abdominal or pelvic lymph nodes. There were no new interval findings. There was similar circumferential wall thickening of a nondistended urinary bladder, which likely accentuated wall thickening There was hepatic steatosis and aortic atherosclerosis.  11/03/2020, PET showed recurrent peritoneal metastasis in the upper abdomen adjacent to the pancreas.  The lesion is 3.8 x 2.9 cm with SUV of 11.3. No evidence of metastatic peritoneal disease elsewhere in the abdomen pelvis.  No evidence of distant metastasis.  Other medical problems Chronic lower extremity edema. 12/24/2018, right lower extremity duplex negative for DVT.  Small right Baker's cyst. 08/16/2019, bilateral lower extremity duplex showed no DVT.  08/16/2019 - 08/17/2019 with right lower extremity cellulitis.  He was unable to bear weight.  He was treated with IV fluids, NSAIDs, colchicine, and broad antibiotics (vancomycin and Cefepime).  He was discharged on indomethacin x 5 days and Keflex 500 mg TID x 5 days.  10/05/2020, colonoscopy showed internal hemorrhoids.  Otherwise normal examination. 11/03/2020 PET scan showed recurrent peritoneal metastasis near pancreas. Given that he has no other distant metastasis.  Repeat SBRT may be considered if he does not respond well to systemic chemotherapy.  Discussed with radiation oncology.  11/26/2020 patient was started on 5-FU and bevacizumab.  INTERVAL HISTORY Jared Gutierrez is a 80 y.o. male who has above history reviewed by me today presents for follow up visit for management of recurrent metastatic colon cancer Problems and complaints are listed below: he was accompanied by wife Patient tolerated cycle one 5-FU and bevacizumab.  Overall he tolerates well.  Denies nausea vomiting diarrhea.   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  Patient was given gabapentin which he did not end up trying due to the concern of possible side effects.  Review of Systems  Constitutional:  Negative for appetite change, chills, diaphoresis, fever and unexpected weight change.  HENT:   Negative for hearing loss, nosebleeds, sore throat and tinnitus.   Respiratory:  Positive for shortness of breath. Negative for cough and hemoptysis.   Cardiovascular:  Positive for leg swelling. Negative for chest pain and palpitations.  Gastrointestinal:  Negative for abdominal pain, blood in stool, constipation, diarrhea, nausea and vomiting.  Genitourinary:  Negative for dysuria, frequency and hematuria.   Musculoskeletal:  Negative for back pain, myalgias and neck pain.  Skin:  Negative for itching and rash.  Neurological:  Negative for dizziness and headaches.  Hematological:  Does not bruise/bleed easily.  Psychiatric/Behavioral:  Negative for depression. The patient is not nervous/anxious.      Past Medical History:  Diagnosis Date   Arthritis    OSTEOARTHRITIS   Cancer ([place])    Cavitary lesion of lung    RIGHT LOWER LOBE   Chicken pox    Colon cancer ([place])    History of kidney stones    Hypertension    Lipoma of colon    Nephrolithiasis    Nephrolithiasis    Obesity    Shingles    Tubular adenoma of colon    multiple fragments    Past Surgical History:  Procedure Laterality Date   COLON SURGERY     COLONOSCOPY N/A 10/02/2014   Procedure: COLONOSCOPY;  Surgeon: Rodman Key  Elmyra Ricks, MD;  Location: [facility] ENDOSCOPY;  Service: Endoscopy;  Laterality: N/A;   COLONOSCOPY WITH PROPOFOL N/A 02/16/2017   Procedure: COLONOSCOPY WITH PROPOFOL;  Surgeon: Manya Silvas, MD;  Location: [facility] ENDOSCOPY;  Service: Endoscopy;  Laterality: N/A;   COLONOSCOPY WITH PROPOFOL N/A 10/05/2020   Procedure: COLONOSCOPY WITH PROPOFOL;  Surgeon: Lesly Rubenstein, MD;  Location: [facility] ENDOSCOPY;  Service: Endoscopy;  Laterality:  N/A;   EUS N/A 04/17/2019   Procedure: FULL UPPER ENDOSCOPIC ULTRASOUND (EUS) RADIAL;  Surgeon: Holly Bodily, MD;  Location: [facility] ENDOSCOPY;  Service: Gastroenterology;  Laterality: N/A;   KIDNEY STONE SURGERY     PARTIAL COLECTOMY  10/17/2013   PORTACATH PLACEMENT Right 06/13/2019   Procedure: INSERTION PORT-A-CATH;  Surgeon: Benjamine Sprague, DO;  Location: [facility] ORS;  Service: General;  Laterality: Right;    Family History  Problem Relation Age of Onset   Cancer Mother    Breast cancer Mother    COPD Father     Social History:  reports that he has never smoked. He has never used smokeless tobacco. He reports current alcohol use. He reports that he does not use drugs. 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 He is married and his wife's name is [name] [phone]).  The patient is alone today.  Allergies: No Known Allergies  Current Medications: Current Outpatient Medications  Medication Sig Dispense Refill   acetaminophen (TYLENOL) 500 MG tablet Take 500 mg by mouth every 6 (six) hours as needed.     amLODipine (NORVASC) 2.5 MG tablet Take 2.5 mg by mouth daily.     aspirin EC 81 MG tablet Take 81 mg by mouth daily.     Cholecalciferol (VITAMIN D) 125 MCG (5000 UT) CAPS Take 5,000 mg by mouth.     docusate sodium (COLACE) 100 MG capsule Take 100 mg by mouth 2 (two) times daily.     gabapentin (NEURONTIN) 100 MG capsule Take 1 capsule (100 mg total) by mouth 3 (three) times daily. 90 capsule 0   HYDROcodone-acetaminophen (NORCO/VICODIN) 5-325 MG tablet Take 1 tablet by mouth every 6 (six) hours as needed for moderate pain (up to 3 doses for moderate pain.).     lidocaine-prilocaine (EMLA) cream Apply to affected area once 30 g 3   loratadine (CLARITIN) 10 MG tablet Take 10 mg by mouth daily.     olmesartan (BENICAR) 20 MG tablet Take 1 tablet by mouth daily.     ondansetron (ZOFRAN) 8 MG tablet Take 1 tablet (8 mg total) by mouth 2 (two) times daily  as needed for refractory nausea / vomiting. Start on day 3 after chemotherapy. 60 tablet 1   potassium chloride SA (KLOR-CON) 20 MEQ tablet Take by mouth.     Probiotic Product (PROBIOTIC DAILY PO) Take 1 tablet by mouth daily.     prochlorperazine (COMPAZINE) 10 MG tablet Take 1 tablet (10 mg total) by mouth every 6 (six) hours as needed (NAUSEA). 30 tablet 1   NEOMYCIN-POLYMYXIN-HYDROCORTISONE (CORTISPORIN) 1 % SOLN OTIC solution SMARTSIG:Left Ear (Patient not taking: No sig reported)     No current facility-administered medications for this visit.   Facility-Administered Medications Ordered in Other Visits  Medication Dose Route Frequency Provider Last Rate Last Admin   0.9 %  sodium chloride infusion   Intravenous Once Corcoran, Melissa C, MD       0.9 %  sodium chloride infusion   Intravenous Continuous Mike Gip, Drue Second, MD  10 mL/hr at 12/31/19 1000 New Bag at 10/05/20 1045   fluorouracil (ADRUCIL) 5,800 mg in sodium chloride 0.9 % 134 mL chemo infusion  2,400 mg/m2 (Order-Specific) Intravenous 1 day or 1 dose [name], MD   5,800 mg at 12/14/20 1227   heparin lock flush 100 unit/mL  500 Units Intravenous Once Lequita Asal, MD        Performance status (ECOG): 1  Vitals Blood pressure 127/80, pulse 68, temperature 97.9 F (36.6 C), resp. rate 18, weight 270 lb 4.8 oz (122.6 kg).   Physical Exam Vitals and nursing note reviewed.  Constitutional:      General: He is not in acute distress.    Appearance: He is well-developed. He is obese. He is not diaphoretic.     Interventions: Face mask in place.     Comments: Patient walks with a cane  HENT:     Head: Normocephalic and atraumatic.     Mouth/Throat:     Mouth: Mucous membranes are moist.     Pharynx: Oropharynx is clear.  Eyes:     General: No scleral icterus.    Extraocular Movements: Extraocular movements intact.     Conjunctiva/sclera: Conjunctivae normal.     Pupils: Pupils are equal, round, and reactive to  light.  Cardiovascular:     Rate and Rhythm: Normal rate and regular rhythm.     Heart sounds: Normal heart sounds. No murmur heard. Pulmonary:     Effort: Pulmonary effort is normal. No respiratory distress.     Breath sounds: No wheezing or rales.     Comments: Decreased breath sound bilaterally Chest:     Chest wall: No tenderness.  Breasts:    Right: No axillary adenopathy or supraclavicular adenopathy.     Left: No axillary adenopathy or supraclavicular adenopathy.  Abdominal:     General: Bowel sounds are normal. There is no distension.     Palpations: Abdomen is soft. There is no mass.     Tenderness: There is no abdominal tenderness. There is no guarding or rebound.  Musculoskeletal:        General: No swelling or tenderness. Normal range of motion.     Cervical back: Normal range of motion and neck supple.     Right lower leg: Edema present.     Left lower leg: Edema present.  Lymphadenopathy:     Head:     Right side of head: No preauricular, posterior auricular or occipital adenopathy.     Left side of head: No preauricular, posterior auricular or occipital adenopathy.     Cervical: No cervical adenopathy.     Upper Body:     Right upper body: No supraclavicular or axillary adenopathy.     Left upper body: No supraclavicular or axillary adenopathy.     Lower Body: No right inguinal adenopathy. No left inguinal adenopathy.  Skin:    General: Skin is warm and dry.  Neurological:     General: No focal deficit present.     Mental Status: He is alert and oriented to person, place, and time. Mental status is at baseline.  Psychiatric:        Mood and Affect: Mood normal.   Infusion on 12/14/2020  Component Date Value Ref Range Status   Sodium 12/14/2020 136  135 - 145 mmol/L Final   Potassium 12/14/2020 3.7  3.5 - 5.1 mmol/L Final   Chloride 12/14/2020 101  98 - 111 mmol/L Final   CO2 12/14/2020 28  22 - 32 mmol/L Final   Glucose, Bld 12/14/2020 92  70 - 99 mg/dL  Final   Glucose reference range applies only to samples taken after fasting for at least 8 hours.   BUN 12/14/2020 12  8 - 23 mg/dL Final   Creatinine, Ser 12/14/2020 0.70  0.61 - 1.24 mg/dL Final   Calcium 12/14/2020 9.2  8.9 - 10.3 mg/dL Final   Total Protein 12/14/2020 6.9  6.5 - 8.1 g/dL Final   Albumin 12/14/2020 3.7  3.5 - 5.0 g/dL Final   AST 12/14/2020 39  15 - 41 U/L Final   ALT 12/14/2020 35  0 - 44 U/L Final   Alkaline Phosphatase 12/14/2020 57  38 - 126 U/L Final   Total Bilirubin 12/14/2020 1.0  0.3 - 1.2 mg/dL Final   GFR, Estimated 12/14/2020 >60  >60 mL/min Final   Comment: (NOTE) Calculated using the CKD-EPI Creatinine Equation (2021)    Anion gap 12/14/2020 7  5 - 15 Final   Performed at [facility], [place], [place] [address]   WBC 12/14/2020 5.0  4.0 - 10.5 K/uL Final   RBC 12/14/2020 4.05 (A) 4.22 - 5.81 MIL/uL Final   Hemoglobin 12/14/2020 12.9 (A) 13.0 - 17.0 g/dL Final   HCT 12/14/2020 38.3 (A) 39.0 - 52.0 % Final   MCV 12/14/2020 94.6  80.0 - 100.0 fL Final   MCH 12/14/2020 31.9  26.0 - 34.0 pg Final   MCHC 12/14/2020 33.7  30.0 - 36.0 g/dL Final   RDW 12/14/2020 12.7  11.5 - 15.5 % Final   Platelets 12/14/2020 197  150 - 400 K/uL Final   nRBC 12/14/2020 0.0  0.0 - 0.2 % Final   Neutrophils Relative % 12/14/2020 53  % Final   Neutro Abs 12/14/2020 2.6  1.7 - 7.7 K/uL Final   Lymphocytes Relative 12/14/2020 33  % Final   Lymphs Abs 12/14/2020 1.6  0.7 - 4.0 K/uL Final   Monocytes Relative 12/14/2020 10  % Final   Monocytes Absolute 12/14/2020 0.5  0.1 - 1.0 K/uL Final   Eosinophils Relative 12/14/2020 3  % Final   Eosinophils Absolute 12/14/2020 0.1  0.0 - 0.5 K/uL Final   Basophils Relative 12/14/2020 1  % Final   Basophils Absolute 12/14/2020 0.1  0.0 - 0.1 K/uL Final   Immature Granulocytes 12/14/2020 0  % Final   Abs Immature Granulocytes 12/14/2020 0.02  0.00 - 0.07 K/uL Final   Performed at [facility], [address] [place], [place] [address]   Total Protein, Urine 12/14/2020 <6  mg/dL Final   Comment: NO NORMAL RANGE ESTABLISHED FOR THIS TEST Performed at [facility], [address], [place], [place] [address]     Assessment and plan 1. 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 Chemotherapy-induced neuropathy ([place])   4. Anemia due to chemotherapy   5. Encounter for antineoplastic chemotherapy   6. Other constipation    #Metastatic colon cancer-recurrent Labs reviewed and discussed with patient Proceed with cycle two 5-FU/bevacizumab today   # Chemotherapy induced neuropathy Patient has supply of gabapentin and we discussed about instructions.  He may start if he needs to  #Chemotherapy-induced anemia, hemoglobin slightly decreased.  Continue to monitor.  # Constipation, recommend patient to take Colace 157m -2047mdaily.   Follow-up 2 weeks lab MD [facility] discussed the assessment and treatment plan with the  patient.  The patient was provided an opportunity to ask questions and all were answered.  The patient agreed with the plan and demonstrated an understanding of the instructions.  The patient was advised to call back if the symptoms worsen or if the condition fails to improve as anticipated.  [name], MD, PhD Hematology Oncology [place] at [facility] 12/14/2020

## 2020-12-15 ENCOUNTER — Other Ambulatory Visit: Payer: Medicare Other

## 2020-12-15 ENCOUNTER — Ambulatory Visit: Payer: Medicare Other | Admitting: Oncology

## 2020-12-15 ENCOUNTER — Ambulatory Visit: Payer: Medicare Other

## 2020-12-15 LAB — CEA: CEA: 29.5 ng/mL — ABNORMAL HIGH (ref 0.0–4.7)

## 2020-12-16 ENCOUNTER — Other Ambulatory Visit: Payer: Self-pay

## 2020-12-16 ENCOUNTER — Inpatient Hospital Stay: Payer: Medicare Other

## 2020-12-16 DIAGNOSIS — Z5112 Encounter for antineoplastic immunotherapy: Secondary | ICD-10-CM | POA: Diagnosis not present

## 2020-12-16 DIAGNOSIS — C786 Secondary malignant neoplasm of retroperitoneum and peritoneum: Secondary | ICD-10-CM

## 2020-12-16 DIAGNOSIS — Z85038 Personal history of other malignant neoplasm of large intestine: Secondary | ICD-10-CM

## 2020-12-16 MED ORDER — HEPARIN SOD (PORK) LOCK FLUSH 100 UNIT/ML IV SOLN
INTRAVENOUS | Status: AC
  Filled 2020-12-16: qty 5

## 2020-12-16 MED ORDER — SODIUM CHLORIDE 0.9% FLUSH
10.0000 mL | INTRAVENOUS | Status: DC | PRN
  Administered 2020-12-16: 10 mL
  Filled 2020-12-16: qty 10

## 2020-12-16 MED ORDER — HEPARIN SOD (PORK) LOCK FLUSH 100 UNIT/ML IV SOLN
500.0000 [IU] | Freq: Once | INTRAVENOUS | Status: AC | PRN
  Administered 2020-12-16: 500 [IU]
  Filled 2020-12-16: qty 5

## 2020-12-28 ENCOUNTER — Inpatient Hospital Stay: Payer: Medicare Other

## 2020-12-28 ENCOUNTER — Encounter: Payer: Self-pay | Admitting: Oncology

## 2020-12-28 ENCOUNTER — Inpatient Hospital Stay (HOSPITAL_BASED_OUTPATIENT_CLINIC_OR_DEPARTMENT_OTHER): Payer: Medicare Other | Admitting: Oncology

## 2020-12-28 VITALS — BP 144/74 | HR 79 | Temp 99.0°F | Resp 18 | Ht 68.0 in | Wt 271.0 lb

## 2020-12-28 DIAGNOSIS — C786 Secondary malignant neoplasm of retroperitoneum and peritoneum: Secondary | ICD-10-CM

## 2020-12-28 DIAGNOSIS — G620 Drug-induced polyneuropathy: Secondary | ICD-10-CM | POA: Diagnosis not present

## 2020-12-28 DIAGNOSIS — T451X5A Adverse effect of antineoplastic and immunosuppressive drugs, initial encounter: Secondary | ICD-10-CM

## 2020-12-28 DIAGNOSIS — Z5112 Encounter for antineoplastic immunotherapy: Secondary | ICD-10-CM | POA: Diagnosis not present

## 2020-12-28 DIAGNOSIS — D6481 Anemia due to antineoplastic chemotherapy: Secondary | ICD-10-CM

## 2020-12-28 DIAGNOSIS — Z85038 Personal history of other malignant neoplasm of large intestine: Secondary | ICD-10-CM

## 2020-12-28 DIAGNOSIS — C189 Malignant neoplasm of colon, unspecified: Secondary | ICD-10-CM

## 2020-12-28 DIAGNOSIS — Z5111 Encounter for antineoplastic chemotherapy: Secondary | ICD-10-CM

## 2020-12-28 DIAGNOSIS — T451X5D Adverse effect of antineoplastic and immunosuppressive drugs, subsequent encounter: Secondary | ICD-10-CM

## 2020-12-28 LAB — CBC WITH DIFFERENTIAL/PLATELET
Abs Immature Granulocytes: 0.01 10*3/uL (ref 0.00–0.07)
Basophils Absolute: 0 10*3/uL (ref 0.0–0.1)
Basophils Relative: 1 %
Eosinophils Absolute: 0.1 10*3/uL (ref 0.0–0.5)
Eosinophils Relative: 2 %
HCT: 38.1 % — ABNORMAL LOW (ref 39.0–52.0)
Hemoglobin: 12.8 g/dL — ABNORMAL LOW (ref 13.0–17.0)
Immature Granulocytes: 0 %
Lymphocytes Relative: 28 %
Lymphs Abs: 1.3 10*3/uL (ref 0.7–4.0)
MCH: 32.1 pg (ref 26.0–34.0)
MCHC: 33.6 g/dL (ref 30.0–36.0)
MCV: 95.5 fL (ref 80.0–100.0)
Monocytes Absolute: 0.3 10*3/uL (ref 0.1–1.0)
Monocytes Relative: 7 %
Neutro Abs: 2.8 10*3/uL (ref 1.7–7.7)
Neutrophils Relative %: 62 %
Platelets: 162 10*3/uL (ref 150–400)
RBC: 3.99 MIL/uL — ABNORMAL LOW (ref 4.22–5.81)
RDW: 12.9 % (ref 11.5–15.5)
WBC: 4.6 10*3/uL (ref 4.0–10.5)
nRBC: 0 % (ref 0.0–0.2)

## 2020-12-28 LAB — COMPREHENSIVE METABOLIC PANEL
ALT: 31 U/L (ref 0–44)
AST: 41 U/L (ref 15–41)
Albumin: 3.5 g/dL (ref 3.5–5.0)
Alkaline Phosphatase: 54 U/L (ref 38–126)
Anion gap: 7 (ref 5–15)
BUN: 12 mg/dL (ref 8–23)
CO2: 26 mmol/L (ref 22–32)
Calcium: 9.1 mg/dL (ref 8.9–10.3)
Chloride: 105 mmol/L (ref 98–111)
Creatinine, Ser: 0.72 mg/dL (ref 0.61–1.24)
GFR, Estimated: >60 mL/min (ref >60)
Glucose, Bld: 185 mg/dL — ABNORMAL HIGH (ref 70–99)
Potassium: 3.5 mmol/L (ref 3.5–5.1)
Sodium: 138 mmol/L (ref 135–145)
Total Bilirubin: 1 mg/dL (ref 0.3–1.2)
Total Protein: 6.7 g/dL (ref 6.5–8.1)

## 2020-12-28 LAB — PROTEIN, URINE, RANDOM: Total Protein, Urine: <6 mg/dL

## 2020-12-28 MED ORDER — SODIUM CHLORIDE 0.9 % IV SOLN
10.0000 mg | Freq: Once | INTRAVENOUS | Status: AC
  Administered 2020-12-28: 10 mg via INTRAVENOUS
  Filled 2020-12-28: qty 10

## 2020-12-28 MED ORDER — SODIUM CHLORIDE 0.9 % IV SOLN
1000.0000 mg | Freq: Once | INTRAVENOUS | Status: AC
  Administered 2020-12-28: 1000 mg via INTRAVENOUS
  Filled 2020-12-28: qty 50

## 2020-12-28 MED ORDER — SODIUM CHLORIDE 0.9% FLUSH
10.0000 mL | Freq: Once | INTRAVENOUS | Status: AC
  Administered 2020-12-28: 10 mL via INTRAVENOUS
  Filled 2020-12-28: qty 10

## 2020-12-28 MED ORDER — SODIUM CHLORIDE 0.9 % IV SOLN
2400.0000 mg/m2 | INTRAVENOUS | Status: DC
  Administered 2020-12-28: 5800 mg via INTRAVENOUS
  Filled 2020-12-28: qty 116

## 2020-12-28 MED ORDER — SODIUM CHLORIDE 0.9 % IV SOLN
5.0000 mg/kg | Freq: Once | INTRAVENOUS | Status: AC
  Administered 2020-12-28: 600 mg via INTRAVENOUS
  Filled 2020-12-28: qty 16

## 2020-12-28 MED ORDER — SODIUM CHLORIDE 0.9 % IV SOLN
Freq: Once | INTRAVENOUS | Status: AC
  Filled 2020-12-28: qty 250

## 2020-12-28 MED ORDER — FLUOROURACIL CHEMO INJECTION 2.5 GM/50ML
400.0000 mg/m2 | Freq: Once | INTRAVENOUS | Status: AC
  Administered 2020-12-28: 950 mg via INTRAVENOUS
  Filled 2020-12-28: qty 19

## 2020-12-28 MED ORDER — PALONOSETRON HCL INJECTION 0.25 MG/5ML
0.2500 mg | Freq: Once | INTRAVENOUS | Status: AC
  Administered 2020-12-28: 0.25 mg via INTRAVENOUS
  Filled 2020-12-28: qty 5

## 2020-12-28 NOTE — Patient Instructions (Signed)
[facility] ONCOLOGY  Discharge Instructions: Thank you for choosing [facility] to provide your oncology and hematology care.  If you have a lab appointment with the [facility], please go directly to the [facility] and check in at the registration area.  Wear comfortable clothing and clothing appropriate for easy access to any Portacath or PICC line.   We strive to give you quality time with your provider. You may need to reschedule your appointment if you arrive late (15 or more minutes).  Arriving late affects you and other patients whose appointments are after yours.  Also, if you miss three or more appointments without notifying the office, you may be dismissed from the clinic at the provider's discretion.      For prescription refill requests, have your pharmacy contact our office and allow 72 hours for refills to be completed.    Today you received the following chemotherapy and/or immunotherapy agents avastin, adrucil    To help prevent nausea and vomiting after your treatment, we encourage you to take your nausea medication as directed.  BELOW ARE SYMPTOMS THAT SHOULD BE REPORTED IMMEDIATELY: *FEVER GREATER THAN 100.4 F (38 C) OR HIGHER *CHILLS OR SWEATING *NAUSEA AND VOMITING THAT IS NOT CONTROLLED WITH YOUR NAUSEA MEDICATION *UNUSUAL SHORTNESS OF BREATH *UNUSUAL BRUISING OR BLEEDING *URINARY PROBLEMS (pain or burning when urinating, or frequent urination) *BOWEL PROBLEMS (unusual diarrhea, constipation, pain near the anus) TENDERNESS IN MOUTH AND THROAT WITH OR WITHOUT PRESENCE OF ULCERS (sore throat, sores in mouth, or a toothache) UNUSUAL RASH, SWELLING OR PAIN  UNUSUAL VAGINAL DISCHARGE OR ITCHING   Items with * indicate a potential emergency and should be followed up as soon as possible or go to the Emergency Department if any problems should occur.  Please show the CHEMOTHERAPY ALERT CARD or IMMUNOTHERAPY ALERT CARD at  check-in to the Emergency Department and triage nurse.  Should you have questions after your visit or need to cancel or reschedule your appointment, please contact [facility]  [phone] and follow the prompts.  Office hours are 8:00 a.m. to 4:30 p.m. Monday - Friday. Please note that voicemails left after 4:00 p.m. may not be returned until the following business day.  We are closed weekends and major holidays. You have access to a nurse at all times for urgent questions. Please call the main number to the clinic [phone] and follow the prompts.  For any non-urgent questions, you may also contact your provider using MyChart. We now offer e-Visits for anyone 21 and older to request care online for non-urgent symptoms. For details visit [email or web address].   Also download the MyChart app! Go to the app store, search "MyChart", open the app, select [facility], and log in with your MyChart username and password.  Due to Covid, a mask is required upon entering the hospital/clinic. If you do not have a mask, one will be given to you upon arrival. For doctor visits, patients may have 1 support person aged 55 or older with them. For treatment visits, patients cannot have anyone with them due to current Covid guidelines and our immunocompromised population.

## 2020-12-28 NOTE — Progress Notes (Signed)
[facility]  [address] [place], [place] [address] Phone: [phone]  Fax: [phone]   Clinic Day: 12/28/2020   Referring physician: Tracie Harrier, MD  Chief Complaint: Jared Gutierrez is a 80 y.o. male presents for follow-up of metastatic colon cancer   PERTINENT ONCOLOGY HISTORY Jared Gutierrez is a 80 y.o.[place] who has above oncology history reviewed by me today presented for follow up visit for management of  Metastatic colon cancer. [name], patient switched care to me on 11/11/20 Extensive medical record review was performed by me  # Metastatic colon cancer.  he was diagnosed with stage I colon cancer s/p transverse colectomy on 10/17/2013.  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  Tumor extended into the submucosa. Margins were negative. + lymphovascular invasion. 14 lymph nodes were negative. Pathologic stage was T1 N0.   10/02/2014 Colonoscopy showed several 3 mm polyps and an 8 mm polyp in the cecum and transverse colon.  Pathology revealed tubular adenomas negative for high-grade dysplasia and malignancy.  02/16/2017 Colonoscopy  revealed 2 diminutive polyps in the descending colon.  Pathology revealed tubular adenomas without dysplasia or malignancy.  Her CEA has been monitored and was noted to increase starting July 2020. 04/02/2019  PET scan  limited evealed a 2.4 x 2.3 cm (SUV 11) hypermetabolic soft tissue density caudal and anterior to the pancreatic neck favored to represent isolated peritoneal or nodal metastasis in the setting of prior transverse colonic resection (expected primary drainage). Although this was immediately adjacent to the pancreas, a fat plane was maintained, arguing strongly against a pancreatic primary. Otherwise, there was no evidence of hypermetabolic metastasis.    04/17/2019 EUS on  revealed a normal esophagus, stomach, duodenum, and pancreas.  There was a 2.4 x 2.4 cm irregular mass in the retroperitoneum adjacent to, but not involving the pancreatic neck.  FNA and core needle biopsy were performed.  Pathology revealed adenocarcinoma compatible with a metastatic lesion of colorectal origin.  Tumor cells were positive for CK20 and CDX2 and negative for CK7.   NGS: [employer] on 05/15/2019 revealed + KRASG13D and TP53.  Negative results included BRAF V600E, Her2, NRAS, NTRK, PD-L1 (<1%), and TMB 8.7/Mb (intermediate).  MMR testing from his colon resection on 10/16/2013 was intact with a low probability of MSI-H.  06/25/2019, CEA 17.8. 07/09/2019 - 08/27/2019; 09/17/2019 - 10/15/2019; 11/12/2019 - 12/17/2019 He received 11 cycles of FOLFOX chemotherapy and 1 cycle of 5-FU and leucovorin (12/31/2019).  He received Neulasta after cycle #4 and #5 secondary to progressive leukopenia.  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  He received a truncated course of FOLFOX with cycle #11 secondary to oxaliplatin reaction.  08/27/2019, CEA 6.1 10/29/2019, CEA 7.7 12/31/2019, CEA 10.8.  01/14/2020 PET revealed an interval decrease in size and FDG uptake (2.4 x 2.3 cm with SUV 11 to 2.4 x 1.7 cm with SUV 4.09) associated with the previously referenced soft tissue density caudal and anterior to the pancreatic neck suggesting treatment response. There were no new sites of FDG avid tumor.  Radiation treatment 02/10/2020 - 02/23/2020.   He received a hybrid 3-dimensional course of 3000 cGy in 10 fractions to the residual FDG avid mass   05/27/2020, CEA 11.6 05/27/2020 Abdomen and pelvis CT :stable to minimally increased (2.9 x 1.8 cm compared to 2.4 x 1.7 cm) since 01/14/2020.  There were no new interval findings.  08/31/2020, CEA 30.9. 08/31/2020 Abdomen and pelvis CT revealed increased size of the index soft tissue lesion inferior and anterior to the pancreatic neck  (2.9 x 1.8 cm to 3.5 x 2.9 cm). There were similar prominent retroperitoneal lymph nodes without adenopathy by size criteria. There was no new or enlarging abdominal or pelvic lymph nodes. There were no new interval findings. There was similar circumferential wall thickening of a nondistended urinary bladder, which likely accentuated wall thickening There was hepatic steatosis and aortic atherosclerosis.  11/03/2020, PET showed recurrent peritoneal metastasis in the upper abdomen adjacent to the pancreas.  The lesion is 3.8 x 2.9 cm with SUV of 11.3. No evidence of metastatic peritoneal disease elsewhere in the abdomen pelvis.  No evidence of distant metastasis.  Other medical problems Chronic lower extremity edema. 12/24/2018, right lower extremity duplex negative for DVT.  Small right Baker's cyst. 08/16/2019, bilateral lower extremity duplex showed no DVT.  08/16/2019 - 08/17/2019 with right lower extremity cellulitis.  He was unable to bear weight.  He was treated with IV fluids, NSAIDs, colchicine, and broad antibiotics (vancomycin and Cefepime).  He was discharged on indomethacin x 5 days and Keflex 500 mg TID x 5 days.  10/05/2020, colonoscopy showed internal hemorrhoids.  Otherwise normal examination. 11/03/2020 PET scan showed recurrent peritoneal metastasis near pancreas. Given that he has no other distant metastasis.  Repeat SBRT may be considered if he does not respond well to systemic chemotherapy.  Discussed with radiation oncology.  11/26/2020 patient was started on 5-FU and bevacizumab. 12/14/2020 CEA 29.5  INTERVAL HISTORY Jared Gutierrez is a 80 y.o. male who has above history reviewed by me today presents for follow up visit for management of recurrent metastatic colon cancer Problems and complaints are listed below: he was accompanied by wife Patient tolerated cycle one 5-FU and bevacizumab.  Overall he tolerates well. No nausea vomiting diarrhea.    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  Patient was given gabapentin which he is not taking currently due to the concern of possible side effects.  Review of Systems  Constitutional:  Negative for appetite change, chills, diaphoresis, fever and unexpected weight change.  HENT:   Negative for hearing loss, nosebleeds, sore throat and tinnitus.   Respiratory:  Positive for shortness of breath. Negative for cough and hemoptysis.   Cardiovascular:  Positive for leg swelling. Negative for chest pain and palpitations.  Gastrointestinal:  Negative for abdominal pain, blood in stool, constipation, diarrhea, nausea and vomiting.  Genitourinary:  Negative for dysuria, frequency and hematuria.   Musculoskeletal:  Negative for back pain, myalgias and neck pain.  Skin:  Negative for itching and rash.  Neurological:  Negative for dizziness and headaches.  Hematological:  Does not bruise/bleed easily.  Psychiatric/Behavioral:  Negative for depression. The patient is not nervous/anxious.      Past Medical History:  Diagnosis Date   Arthritis    OSTEOARTHRITIS   Cancer ([place])    Cavitary lesion of lung    RIGHT LOWER LOBE   Chicken pox    Colon cancer ([place])    History of kidney stones    Hypertension    Lipoma of colon    Nephrolithiasis    Nephrolithiasis    Obesity    Shingles    Tubular adenoma of colon    multiple fragments    Past Surgical History:  Procedure Laterality Date   COLON SURGERY     COLONOSCOPY N/A 10/02/2014   Procedure: COLONOSCOPY;  Surgeon: Josefine Class, MD;  Location: [facility] ENDOSCOPY;  Service: Endoscopy;  Laterality: N/A;   COLONOSCOPY WITH PROPOFOL N/A 02/16/2017   Procedure: COLONOSCOPY WITH PROPOFOL;  Surgeon: Manya Silvas, MD;  Location: [facility] ENDOSCOPY;  Service: Endoscopy;  Laterality: N/A;   COLONOSCOPY WITH PROPOFOL N/A 10/05/2020   Procedure: COLONOSCOPY WITH PROPOFOL;  Surgeon: Lesly Rubenstein, MD;  Location: [facility] ENDOSCOPY;  Service:  Endoscopy;  Laterality: N/A;   EUS N/A 04/17/2019   Procedure: FULL UPPER ENDOSCOPIC ULTRASOUND (EUS) RADIAL;  Surgeon: Holly Bodily, MD;  Location: [facility] ENDOSCOPY;  Service: Gastroenterology;  Laterality: N/A;   KIDNEY STONE SURGERY     PARTIAL COLECTOMY  10/17/2013   PORTACATH PLACEMENT Right 06/13/2019   Procedure: INSERTION PORT-A-CATH;  Surgeon: Benjamine Sprague, DO;  Location: [facility] ORS;  Service: General;  Laterality: Right;    Family History  Problem Relation Age of Onset   Cancer Mother    Breast cancer Mother    COPD Father     Social History:  reports that he has never smoked. He has never used smokeless tobacco. He reports current alcohol use. He reports that he does not use drugs. 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 He is married and his wife's name is [name] [phone]).  The patient is alone today.  Allergies: No Known Allergies  Current Medications: Current Outpatient Medications  Medication Sig Dispense Refill   acetaminophen (TYLENOL) 500 MG tablet Take 500 mg by mouth every 6 (six) hours as needed.     amLODipine (NORVASC) 2.5 MG tablet Take 2.5 mg by mouth daily.     aspirin EC 81 MG tablet Take 81 mg by mouth daily.     Cholecalciferol (VITAMIN D) 125 MCG (5000 UT) CAPS Take 5,000 mg by mouth.     docusate sodium (COLACE) 100 MG capsule Take 100 mg by mouth 2 (two) times daily.     gabapentin (NEURONTIN) 100 MG capsule Take 1 capsule (100 mg total) by mouth 3 (three) times daily. 90 capsule 0   HYDROcodone-acetaminophen (NORCO/VICODIN) 5-325 MG tablet Take 1 tablet by mouth every 6 (six) hours as needed for moderate pain (up to 3 doses for moderate pain.).     lidocaine-prilocaine (EMLA) cream Apply to affected area once 30 g 3   loratadine (CLARITIN) 10 MG tablet Take 10 mg by mouth daily.     olmesartan (BENICAR) 20 MG tablet Take 1 tablet by mouth daily.     ondansetron (ZOFRAN) 8 MG tablet Take 1 tablet (8 mg total) by  mouth 2 (two) times daily as needed for refractory nausea / vomiting. Start on day 3 after chemotherapy. 60 tablet 1   potassium chloride SA (KLOR-CON) 20 MEQ tablet Take by mouth.     Probiotic Product (PROBIOTIC DAILY PO) Take 1 tablet by mouth daily.     prochlorperazine (COMPAZINE) 10 MG tablet Take 1 tablet (10 mg total) by mouth every 6 (six) hours as needed (NAUSEA). 30 tablet 1   NEOMYCIN-POLYMYXIN-HYDROCORTISONE (CORTISPORIN) 1 % SOLN OTIC solution SMARTSIG:Left Ear (Patient not taking: No sig reported)     No current facility-administered medications for this visit.   Facility-Administered Medications Ordered in Other Visits  Medication Dose Route Frequency Provider Last Rate Last Admin   0.9 %  sodium chloride infusion   Intravenous Once Corcoran, Melissa C, MD       0.9 %  sodium chloride infusion   Intravenous Continuous Mike Gip, Melissa  C, MD 10 mL/hr at 12/31/19 1000 New Bag at 10/05/20 1045   heparin lock flush 100 unit/mL  500 Units Intravenous Once Lequita Asal, MD        Performance status (ECOG): 1  Vitals Height 5' 8"  (1.727 m), weight 271 lb (122.9 kg).   Physical Exam Vitals and nursing note reviewed.  Constitutional:      General: He is not in acute distress.    Appearance: He is well-developed. He is obese. He is not diaphoretic.     Interventions: Face mask in place.     Comments: Patient walks with a cane  HENT:     Head: Normocephalic and atraumatic.     Mouth/Throat:     Mouth: Mucous membranes are moist.     Pharynx: Oropharynx is clear.  Eyes:     General: No scleral icterus.    Extraocular Movements: Extraocular movements intact.     Conjunctiva/sclera: Conjunctivae normal.     Pupils: Pupils are equal, round, and reactive to light.  Cardiovascular:     Rate and Rhythm: Normal rate and regular rhythm.     Heart sounds: Normal heart sounds. No murmur heard. Pulmonary:     Effort: Pulmonary effort is normal. No respiratory distress.      Breath sounds: No wheezing or rales.     Comments: Decreased breath sound bilaterally Chest:     Chest wall: No tenderness.  Abdominal:     General: Bowel sounds are normal. There is no distension.     Palpations: Abdomen is soft. There is no mass.     Tenderness: There is no abdominal tenderness. There is no guarding or rebound.  Musculoskeletal:        General: No swelling or tenderness. Normal range of motion.     Cervical back: Normal range of motion and neck supple.     Right lower leg: Edema present.     Left lower leg: Edema present.  Lymphadenopathy:     Head:     Right side of head: No preauricular, posterior auricular or occipital adenopathy.     Left side of head: No preauricular, posterior auricular or occipital adenopathy.     Cervical: No cervical adenopathy.     Upper Body:     Right upper body: No supraclavicular or axillary adenopathy.     Left upper body: No supraclavicular or axillary adenopathy.     Lower Body: No right inguinal adenopathy. No left inguinal adenopathy.  Skin:    General: Skin is warm and dry.  Neurological:     General: No focal deficit present.     Mental Status: He is alert and oriented to person, place, and time. Mental status is at baseline.  Psychiatric:        Mood and Affect: Mood normal.   Infusion on 12/28/2020  Component Date Value Ref Range Status   WBC 12/28/2020 4.6  4.0 - 10.5 K/uL Final   RBC 12/28/2020 3.99 (A) 4.22 - 5.81 MIL/uL Final   Hemoglobin 12/28/2020 12.8 (A) 13.0 - 17.0 g/dL Final   HCT 12/28/2020 38.1 (A) 39.0 - 52.0 % Final   MCV 12/28/2020 95.5  80.0 - 100.0 fL Final   MCH 12/28/2020 32.1  26.0 - 34.0 pg Final   MCHC 12/28/2020 33.6  30.0 - 36.0 g/dL Final   RDW 12/28/2020 12.9  11.5 - 15.5 % Final   Platelets 12/28/2020 162  150 - 400 K/uL Final   nRBC 12/28/2020 0.0  0.0 - 0.2 % Final   Neutrophils Relative % 12/28/2020 62  % Final   Neutro Abs 12/28/2020 2.8  1.7 - 7.7 K/uL Final   Lymphocytes Relative  12/28/2020 28  % Final   Lymphs Abs 12/28/2020 1.3  0.7 - 4.0 K/uL Final   Monocytes Relative 12/28/2020 7  % Final   Monocytes Absolute 12/28/2020 0.3  0.1 - 1.0 K/uL Final   Eosinophils Relative 12/28/2020 2  % Final   Eosinophils Absolute 12/28/2020 0.1  0.0 - 0.5 K/uL Final   Basophils Relative 12/28/2020 1  % Final   Basophils Absolute 12/28/2020 0.0  0.0 - 0.1 K/uL Final   Immature Granulocytes 12/28/2020 0  % Final   Abs Immature Granulocytes 12/28/2020 0.01  0.00 - 0.07 K/uL Final   Performed at [facility], [address], [place], [place] [address]    Assessment and plan 1. Colon cancer metastasized to multiple sites ([place])   2. 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 Anemia due to chemotherapy   5. Encounter for antineoplastic chemotherapy    #Metastatic colon cancer-recurrent Labs are reviewed and discussed with patient. Proceed with cycle 3 5 FU/ Bevacizumab  # Chemotherapy induced neuropathy Patient has supply of gabapentin and we discussed about instructions.  He may start if he needs to  #Chemotherapy-induced anemia, hemoglobin is 12.8.  monitor  # Constipation, recommend patient to take Colace 175m -2079mdaily. Symptom improved.   Follow-up 2 weeks lab [facility] discussed the assessment and treatment plan with the patient.  The patient was provided an opportunity to ask questions and all were answered.  The patient agreed with the plan and demonstrated an understanding of the instructions.  The patient was advised to call back if the symptoms worsen or if the condition fails to improve as anticipated.  ZhEarlie ServerMD, PhD Hematology Oncology [facility]/16/2022

## 2020-12-29 LAB — CEA: CEA: 20.6 ng/mL — ABNORMAL HIGH (ref 0.0–4.7)

## 2020-12-30 ENCOUNTER — Inpatient Hospital Stay: Payer: Medicare Other

## 2020-12-30 ENCOUNTER — Other Ambulatory Visit: Payer: Self-pay

## 2020-12-30 VITALS — BP 150/83 | HR 86 | Resp 20

## 2020-12-30 DIAGNOSIS — T451X5A Adverse effect of antineoplastic and immunosuppressive drugs, initial encounter: Secondary | ICD-10-CM

## 2020-12-30 DIAGNOSIS — Z5112 Encounter for antineoplastic immunotherapy: Secondary | ICD-10-CM | POA: Diagnosis not present

## 2020-12-30 DIAGNOSIS — G620 Drug-induced polyneuropathy: Secondary | ICD-10-CM

## 2020-12-30 MED ORDER — HEPARIN SOD (PORK) LOCK FLUSH 100 UNIT/ML IV SOLN
500.0000 [IU] | Freq: Once | INTRAVENOUS | Status: AC
Start: 2020-12-30 — End: 2020-12-30
  Administered 2020-12-30: 500 [IU] via INTRAVENOUS
  Filled 2020-12-30: qty 5

## 2020-12-30 MED ORDER — HEPARIN SOD (PORK) LOCK FLUSH 100 UNIT/ML IV SOLN
INTRAVENOUS | Status: AC
  Filled 2020-12-30: qty 5

## 2020-12-30 MED ORDER — SODIUM CHLORIDE 0.9% FLUSH
10.0000 mL | Freq: Once | INTRAVENOUS | Status: AC
  Administered 2020-12-30: 10 mL via INTRAVENOUS
  Filled 2020-12-30: qty 10

## 2021-01-11 ENCOUNTER — Inpatient Hospital Stay: Payer: Medicare Other

## 2021-01-11 ENCOUNTER — Encounter: Payer: Self-pay | Admitting: Oncology

## 2021-01-11 ENCOUNTER — Inpatient Hospital Stay (HOSPITAL_BASED_OUTPATIENT_CLINIC_OR_DEPARTMENT_OTHER): Payer: Medicare Other | Admitting: Oncology

## 2021-01-11 VITALS — BP 138/81 | HR 69 | Temp 97.5°F | Resp 18 | Wt 271.0 lb

## 2021-01-11 DIAGNOSIS — C786 Secondary malignant neoplasm of retroperitoneum and peritoneum: Secondary | ICD-10-CM

## 2021-01-11 DIAGNOSIS — T451X5D Adverse effect of antineoplastic and immunosuppressive drugs, subsequent encounter: Secondary | ICD-10-CM

## 2021-01-11 DIAGNOSIS — Z85038 Personal history of other malignant neoplasm of large intestine: Secondary | ICD-10-CM

## 2021-01-11 DIAGNOSIS — G620 Drug-induced polyneuropathy: Secondary | ICD-10-CM | POA: Diagnosis not present

## 2021-01-11 DIAGNOSIS — T451X5A Adverse effect of antineoplastic and immunosuppressive drugs, initial encounter: Secondary | ICD-10-CM

## 2021-01-11 DIAGNOSIS — C184 Malignant neoplasm of transverse colon: Secondary | ICD-10-CM

## 2021-01-11 DIAGNOSIS — K5900 Constipation, unspecified: Secondary | ICD-10-CM

## 2021-01-11 DIAGNOSIS — D6481 Anemia due to antineoplastic chemotherapy: Secondary | ICD-10-CM | POA: Diagnosis not present

## 2021-01-11 DIAGNOSIS — Z5112 Encounter for antineoplastic immunotherapy: Secondary | ICD-10-CM | POA: Diagnosis not present

## 2021-01-11 DIAGNOSIS — Z5111 Encounter for antineoplastic chemotherapy: Secondary | ICD-10-CM

## 2021-01-11 DIAGNOSIS — C189 Malignant neoplasm of colon, unspecified: Secondary | ICD-10-CM

## 2021-01-11 LAB — COMPREHENSIVE METABOLIC PANEL
ALT: 35 U/L (ref 0–44)
AST: 42 U/L — ABNORMAL HIGH (ref 15–41)
Albumin: 3.7 g/dL (ref 3.5–5.0)
Alkaline Phosphatase: 51 U/L (ref 38–126)
Anion gap: 7 (ref 5–15)
BUN: 13 mg/dL (ref 8–23)
CO2: 26 mmol/L (ref 22–32)
Calcium: 9.2 mg/dL (ref 8.9–10.3)
Chloride: 102 mmol/L (ref 98–111)
Creatinine, Ser: 0.69 mg/dL (ref 0.61–1.24)
GFR, Estimated: >60 mL/min (ref >60)
Glucose, Bld: 182 mg/dL — ABNORMAL HIGH (ref 70–99)
Potassium: 3.5 mmol/L (ref 3.5–5.1)
Sodium: 135 mmol/L (ref 135–145)
Total Bilirubin: 0.9 mg/dL (ref 0.3–1.2)
Total Protein: 6.9 g/dL (ref 6.5–8.1)

## 2021-01-11 LAB — CBC WITH DIFFERENTIAL/PLATELET
Abs Immature Granulocytes: 0.01 10*3/uL (ref 0.00–0.07)
Basophils Absolute: 0 10*3/uL (ref 0.0–0.1)
Basophils Relative: 1 %
Eosinophils Absolute: 0.1 10*3/uL (ref 0.0–0.5)
Eosinophils Relative: 2 %
HCT: 38.6 % — ABNORMAL LOW (ref 39.0–52.0)
Hemoglobin: 12.9 g/dL — ABNORMAL LOW (ref 13.0–17.0)
Immature Granulocytes: 0 %
Lymphocytes Relative: 28 %
Lymphs Abs: 1.4 10*3/uL (ref 0.7–4.0)
MCH: 31.5 pg (ref 26.0–34.0)
MCHC: 33.4 g/dL (ref 30.0–36.0)
MCV: 94.4 fL (ref 80.0–100.0)
Monocytes Absolute: 0.4 10*3/uL (ref 0.1–1.0)
Monocytes Relative: 8 %
Neutro Abs: 2.9 10*3/uL (ref 1.7–7.7)
Neutrophils Relative %: 61 %
Platelets: 152 10*3/uL (ref 150–400)
RBC: 4.09 MIL/uL — ABNORMAL LOW (ref 4.22–5.81)
RDW: 13.1 % (ref 11.5–15.5)
WBC: 4.8 10*3/uL (ref 4.0–10.5)
nRBC: 0 % (ref 0.0–0.2)

## 2021-01-11 LAB — PROTEIN, URINE, RANDOM: Total Protein, Urine: 9 mg/dL

## 2021-01-11 MED ORDER — SODIUM CHLORIDE 0.9 % IV SOLN
10.0000 mg | Freq: Once | INTRAVENOUS | Status: AC
  Administered 2021-01-11: 10 mg via INTRAVENOUS
  Filled 2021-01-11: qty 10

## 2021-01-11 MED ORDER — SODIUM CHLORIDE 0.9% FLUSH
10.0000 mL | Freq: Once | INTRAVENOUS | Status: AC
  Administered 2021-01-11: 10 mL via INTRAVENOUS
  Filled 2021-01-11: qty 10

## 2021-01-11 MED ORDER — PALONOSETRON HCL INJECTION 0.25 MG/5ML: 0.2500 mg | Freq: Once | INTRAVENOUS | Status: DC

## 2021-01-11 MED ORDER — SODIUM CHLORIDE 0.9 % IV SOLN
5.0000 mg/kg | Freq: Once | INTRAVENOUS | Status: AC
  Administered 2021-01-11: 600 mg via INTRAVENOUS
  Filled 2021-01-11: qty 16

## 2021-01-11 MED ORDER — SODIUM CHLORIDE 0.9 % IV SOLN
2400.0000 mg/m2 | INTRAVENOUS | Status: DC
  Administered 2021-01-11: 5800 mg via INTRAVENOUS
  Filled 2021-01-11: qty 116

## 2021-01-11 MED ORDER — SODIUM CHLORIDE 0.9 % IV SOLN
391.0000 mg/m2 | Freq: Once | INTRAVENOUS | Status: AC
  Administered 2021-01-11: 950 mg via INTRAVENOUS
  Filled 2021-01-11: qty 47.5

## 2021-01-11 MED ORDER — FLUOROURACIL CHEMO INJECTION 2.5 GM/50ML
400.0000 mg/m2 | Freq: Once | INTRAVENOUS | Status: AC
  Administered 2021-01-11: 950 mg via INTRAVENOUS
  Filled 2021-01-11: qty 19

## 2021-01-11 MED ORDER — SODIUM CHLORIDE 0.9 % IV SOLN
Freq: Once | INTRAVENOUS | Status: AC
  Filled 2021-01-11: qty 250

## 2021-01-11 MED ORDER — HEPARIN SOD (PORK) LOCK FLUSH 100 UNIT/ML IV SOLN
500.0000 [IU] | Freq: Once | INTRAVENOUS | Status: DC
  Filled 2021-01-11: qty 5

## 2021-01-11 NOTE — Progress Notes (Signed)
[facility]  [address] [place], [place] [address] Phone: [phone]  Fax: [phone]   Clinic Day: 01/11/2021   Referring physician: Tracie Harrier, MD  Chief Complaint: Jared Gutierrez is a 80 y.o. male presents for follow-up of metastatic colon cancer   PERTINENT ONCOLOGY HISTORY Jared Gutierrez is a 80 y.o.[place] who has above oncology history reviewed by me today presented for follow up visit for management of  Metastatic colon cancer. [name], patient switched care to me on 11/11/20 Extensive medical record review was performed by me  # Metastatic colon cancer.  he was diagnosed with stage I colon cancer s/p transverse colectomy on 10/17/2013.  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  Tumor extended into the submucosa. Margins were negative. + lymphovascular invasion. 14 lymph nodes were negative. Pathologic stage was T1 N0.   10/02/2014 Colonoscopy showed several 3 mm polyps and an 8 mm polyp in the cecum and transverse colon.  Pathology revealed tubular adenomas negative for high-grade dysplasia and malignancy.  02/16/2017 Colonoscopy  revealed 2 diminutive polyps in the descending colon.  Pathology revealed tubular adenomas without dysplasia or malignancy.  Her CEA has been monitored and was noted to increase starting July 2020. 04/02/2019  PET scan  limited evealed a 2.4 x 2.3 cm (SUV 11) hypermetabolic soft tissue density caudal and anterior to the pancreatic neck favored to represent isolated peritoneal or nodal metastasis in the setting of prior transverse colonic resection (expected primary drainage). Although this was immediately adjacent to the pancreas, a fat plane was maintained, arguing strongly against a pancreatic primary. Otherwise, there was no evidence of hypermetabolic metastasis.    04/17/2019 EUS on  revealed a normal esophagus, stomach, duodenum, and pancreas.  There was a 2.4 x 2.4 cm irregular mass in the retroperitoneum adjacent to, but not involving the pancreatic neck.  FNA and core needle biopsy were performed.  Pathology revealed adenocarcinoma compatible with a metastatic lesion of colorectal origin.  Tumor cells were positive for CK20 and CDX2 and negative for CK7.   NGS: [employer] on 05/15/2019 revealed + KRASG13D and TP53.  Negative results included BRAF V600E, Her2, NRAS, NTRK, PD-L1 (<1%), and TMB 8.7/Mb (intermediate).  MMR testing from his colon resection on 10/16/2013 was intact with a low probability of MSI-H.  06/25/2019, CEA 17.8. 07/09/2019 - 08/27/2019; 09/17/2019 - 10/15/2019; 11/12/2019 - 12/17/2019 He received 11 cycles of FOLFOX chemotherapy and 1 cycle of 5-FU and leucovorin (12/31/2019).  He received Neulasta after cycle #4 and #5 secondary to progressive leukopenia.  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  He received a truncated course of FOLFOX with cycle #11 secondary to oxaliplatin reaction.  08/27/2019, CEA 6.1 10/29/2019, CEA 7.7 12/31/2019, CEA 10.8.  01/14/2020 PET revealed an interval decrease in size and FDG uptake (2.4 x 2.3 cm with SUV 11 to 2.4 x 1.7 cm with SUV 4.09) associated with the previously referenced soft tissue density caudal and anterior to the pancreatic neck suggesting treatment response. There were no new sites of FDG avid tumor.  Radiation treatment 02/10/2020 - 02/23/2020.   He received a hybrid 3-dimensional course of 3000 cGy in 10 fractions to the residual FDG avid mass   05/27/2020, CEA 11.6 05/27/2020 Abdomen and pelvis CT :stable to minimally increased (2.9 x 1.8 cm compared to 2.4 x 1.7 cm) since 01/14/2020.  There were no new interval findings.  08/31/2020, CEA 30.9. 08/31/2020 Abdomen and pelvis CT revealed increased size of the index soft tissue lesion inferior and anterior to the pancreatic neck  (2.9 x 1.8 cm to 3.5 x 2.9 cm). There were similar prominent retroperitoneal lymph nodes without adenopathy by size criteria. There was no new or enlarging abdominal or pelvic lymph nodes. There were no new interval findings. There was similar circumferential wall thickening of a nondistended urinary bladder, which likely accentuated wall thickening There was hepatic steatosis and aortic atherosclerosis.  11/03/2020, PET showed recurrent peritoneal metastasis in the upper abdomen adjacent to the pancreas.  The lesion is 3.8 x 2.9 cm with SUV of 11.3. No evidence of metastatic peritoneal disease elsewhere in the abdomen pelvis.  No evidence of distant metastasis.  Other medical problems Chronic lower extremity edema. 12/24/2018, right lower extremity duplex negative for DVT.  Small right Baker's cyst. 08/16/2019, bilateral lower extremity duplex showed no DVT.  08/16/2019 - 08/17/2019 with right lower extremity cellulitis.  He was unable to bear weight.  He was treated with IV fluids, NSAIDs, colchicine, and broad antibiotics (vancomycin and Cefepime).  He was discharged on indomethacin x 5 days and Keflex 500 mg TID x 5 days.  10/05/2020, colonoscopy showed internal hemorrhoids.  Otherwise normal examination. 11/03/2020 PET scan showed recurrent peritoneal metastasis near pancreas. Given that he has no other distant metastasis.  Repeat SBRT may be considered if he does not respond well to systemic chemotherapy.  Discussed with radiation oncology.  11/26/2020 patient was started on 5-FU and bevacizumab. 12/14/2020 CEA 29.5  INTERVAL HISTORY Jared Gutierrez is a 80 y.o. male who has above history reviewed by me today presents for follow up visit for management of recurrent metastatic colon cancer Problems and complaints are listed below: he was accompanied by wife Patient tolerated  5-FU and bevacizumab.  Overall he tolerates well.  Patient denies any nausea vomiting diarrhea.  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  Patient was given gabapentin which he is not taking currently due to the concern of possible side effects.  Review of Systems  Constitutional:  Negative for appetite change, chills, diaphoresis, fever and unexpected weight change.  HENT:   Negative for hearing loss, nosebleeds, sore throat and tinnitus.   Respiratory:  Positive for shortness of breath. Negative for cough and hemoptysis.   Cardiovascular:  Positive for leg swelling. Negative for chest pain and palpitations.  Gastrointestinal:  Negative for abdominal pain, blood in stool, constipation, diarrhea, nausea and vomiting.  Genitourinary:  Negative for dysuria, frequency and hematuria.   Musculoskeletal:  Negative for back pain, myalgias and neck pain.  Skin:  Negative for itching and rash.  Neurological:  Negative for dizziness and headaches.  Hematological:  Does not bruise/bleed easily.  Psychiatric/Behavioral:  Negative for depression. The patient is not nervous/anxious.      Past Medical History:  Diagnosis Date   Arthritis    OSTEOARTHRITIS   Cancer ([place])    Cavitary lesion of lung    RIGHT LOWER LOBE   Chicken pox    Colon cancer ([place])    History of kidney stones    Hypertension    Lipoma of colon    Nephrolithiasis    Nephrolithiasis    Obesity    Shingles    Tubular adenoma of colon    multiple fragments    Past Surgical History:  Procedure Laterality Date   COLON SURGERY     COLONOSCOPY N/A 10/02/2014   Procedure: COLONOSCOPY;  Surgeon: Josefine Class, MD;  Location: [facility] ENDOSCOPY;  Service: Endoscopy;  Laterality: N/A;   COLONOSCOPY WITH PROPOFOL N/A 02/16/2017   Procedure: COLONOSCOPY WITH PROPOFOL;  Surgeon: Manya Silvas, MD;  Location: [facility] ENDOSCOPY;  Service: Endoscopy;  Laterality: N/A;   COLONOSCOPY WITH PROPOFOL N/A 10/05/2020   Procedure: COLONOSCOPY WITH PROPOFOL;  Surgeon: Lesly Rubenstein, MD;  Location: [facility] ENDOSCOPY;  Service:  Endoscopy;  Laterality: N/A;   EUS N/A 04/17/2019   Procedure: FULL UPPER ENDOSCOPIC ULTRASOUND (EUS) RADIAL;  Surgeon: Holly Bodily, MD;  Location: [facility] ENDOSCOPY;  Service: Gastroenterology;  Laterality: N/A;   KIDNEY STONE SURGERY     PARTIAL COLECTOMY  10/17/2013   PORTACATH PLACEMENT Right 06/13/2019   Procedure: INSERTION PORT-A-CATH;  Surgeon: Benjamine Sprague, DO;  Location: [facility] ORS;  Service: General;  Laterality: Right;    Family History  Problem Relation Age of Onset   Cancer Mother    Breast cancer Mother    COPD Father     Social History:  reports that he has never smoked. He has never used smokeless tobacco. He reports current alcohol use. He reports that he does not use drugs. 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 He is married and his wife's name is [name] [phone]).  The patient is alone today.  Allergies: No Known Allergies  Current Medications: Current Outpatient Medications  Medication Sig Dispense Refill   acetaminophen (TYLENOL) 500 MG tablet Take 500 mg by mouth every 6 (six) hours as needed.     amLODipine (NORVASC) 2.5 MG tablet Take 2.5 mg by mouth daily.     aspirin EC 81 MG tablet Take 81 mg by mouth daily.     Cholecalciferol (VITAMIN D) 125 MCG (5000 UT) CAPS Take 5,000 mg by mouth.     docusate sodium (COLACE) 100 MG capsule Take 100 mg by mouth 2 (two) times daily.     gabapentin (NEURONTIN) 100 MG capsule Take 1 capsule (100 mg total) by mouth 3 (three) times daily. 90 capsule 0   HYDROcodone-acetaminophen (NORCO/VICODIN) 5-325 MG tablet Take 1 tablet by mouth every 6 (six) hours as needed for moderate pain (up to 3 doses for moderate pain.).     lidocaine-prilocaine (EMLA) cream Apply to affected area once 30 g 3   loratadine (CLARITIN) 10 MG tablet Take 10 mg by mouth daily.     NEOMYCIN-POLYMYXIN-HYDROCORTISONE (CORTISPORIN) 1 % SOLN OTIC solution SMARTSIG:Left Ear (Patient not taking: No sig reported)      olmesartan (BENICAR) 20 MG tablet Take 1 tablet by mouth daily.     ondansetron (ZOFRAN) 8 MG tablet Take 1 tablet (8 mg total) by mouth 2 (two) times daily as needed for refractory nausea / vomiting. Start on day 3 after chemotherapy. 60 tablet 1   potassium chloride SA (KLOR-CON) 20 MEQ tablet Take by mouth.     Probiotic Product (PROBIOTIC DAILY PO) Take 1 tablet by mouth daily.     prochlorperazine (COMPAZINE) 10 MG tablet Take 1 tablet (10 mg total) by mouth every 6 (six) hours as needed (NAUSEA). 30 tablet 1   No current facility-administered medications for this visit.   Facility-Administered Medications Ordered in Other Visits  Medication Dose Route Frequency Provider Last Rate Last Admin   0.9 %  sodium chloride infusion   Intravenous Once Corcoran, Melissa C, MD       0.9 %  sodium chloride infusion   Intravenous Continuous Mike Gip, Melissa  C, MD 10 mL/hr at 12/31/19 1000 New Bag at 10/05/20 1045   heparin lock flush 100 unit/mL  500 Units Intravenous Once Nolon Stalls C, MD       heparin lock flush 100 unit/mL  500 Units Intravenous Once [name], MD       sodium chloride flush (NS) 0.9 % injection 10 mL  10 mL Intravenous Once [name], MD        Performance status (ECOG): 1  Vitals Blood pressure 138/81, pulse 69, temperature (!) 97.5 F (36.4 C), resp. rate 18, weight 271 lb (122.9 kg), SpO2 100 %.   Physical Exam Vitals and nursing note reviewed.  Constitutional:      General: He is not in acute distress.    Appearance: He is well-developed. He is obese. He is not diaphoretic.     Interventions: Face mask in place.     Comments: Patient walks with a cane  HENT:     Head: Normocephalic and atraumatic.     Mouth/Throat:     Mouth: Mucous membranes are moist.     Pharynx: Oropharynx is clear.  Eyes:     General: No scleral icterus.    Extraocular Movements: Extraocular movements intact.     Conjunctiva/sclera: Conjunctivae normal.     Pupils: Pupils are equal,  round, and reactive to light.  Cardiovascular:     Rate and Rhythm: Normal rate and regular rhythm.     Heart sounds: Normal heart sounds. No murmur heard. Pulmonary:     Effort: Pulmonary effort is normal. No respiratory distress.     Breath sounds: No wheezing or rales.     Comments: Decreased breath sound bilaterally Chest:     Chest wall: No tenderness.  Abdominal:     General: Bowel sounds are normal. There is no distension.     Palpations: Abdomen is soft. There is no mass.     Tenderness: There is no abdominal tenderness. There is no guarding or rebound.  Musculoskeletal:        General: No swelling or tenderness. Normal range of motion.     Cervical back: Normal range of motion and neck supple.     Right lower leg: Edema present.     Left lower leg: Edema present.  Lymphadenopathy:     Head:     Right side of head: No preauricular, posterior auricular or occipital adenopathy.     Left side of head: No preauricular, posterior auricular or occipital adenopathy.     Cervical: No cervical adenopathy.     Upper Body:     Right upper body: No supraclavicular or axillary adenopathy.     Left upper body: No supraclavicular or axillary adenopathy.     Lower Body: No right inguinal adenopathy. No left inguinal adenopathy.  Skin:    General: Skin is warm and dry.  Neurological:     General: No focal deficit present.     Mental Status: He is alert and oriented to person, place, and time. Mental status is at baseline.  Psychiatric:        Mood and Affect: Mood normal.   No visits with results within 3 Day(s) from this visit.  Latest known visit with results is:  Infusion on 12/28/2020  Component Date Value Ref Range Status   Sodium 12/28/2020 138  135 - 145 mmol/L Final   Potassium 12/28/2020 3.5  3.5 - 5.1 mmol/L Final   Chloride 12/28/2020 105  98 - 111 mmol/L Final  CO2 12/28/2020 26  22 - 32 mmol/L Final   Glucose, Bld 12/28/2020 185 (A) 70 - 99 mg/dL Final   Glucose  reference range applies only to samples taken after fasting for at least 8 hours.   BUN 12/28/2020 12  8 - 23 mg/dL Final   Creatinine, Ser 12/28/2020 0.72  0.61 - 1.24 mg/dL Final   Calcium 12/28/2020 9.1  8.9 - 10.3 mg/dL Final   Total Protein 12/28/2020 6.7  6.5 - 8.1 g/dL Final   Albumin 12/28/2020 3.5  3.5 - 5.0 g/dL Final   AST 12/28/2020 41  15 - 41 U/L Final   ALT 12/28/2020 31  0 - 44 U/L Final   Alkaline Phosphatase 12/28/2020 54  38 - 126 U/L Final   Total Bilirubin 12/28/2020 1.0  0.3 - 1.2 mg/dL Final   GFR, Estimated 12/28/2020 >60  >60 mL/min Final   Comment: (NOTE) Calculated using the CKD-EPI Creatinine Equation (2021)    Anion gap 12/28/2020 7  5 - 15 Final   Performed at [facility], [place], [place] [address]   WBC 12/28/2020 4.6  4.0 - 10.5 K/uL Final   RBC 12/28/2020 3.99 (A) 4.22 - 5.81 MIL/uL Final   Hemoglobin 12/28/2020 12.8 (A) 13.0 - 17.0 g/dL Final   HCT 12/28/2020 38.1 (A) 39.0 - 52.0 % Final   MCV 12/28/2020 95.5  80.0 - 100.0 fL Final   MCH 12/28/2020 32.1  26.0 - 34.0 pg Final   MCHC 12/28/2020 33.6  30.0 - 36.0 g/dL Final   RDW 12/28/2020 12.9  11.5 - 15.5 % Final   Platelets 12/28/2020 162  150 - 400 K/uL Final   nRBC 12/28/2020 0.0  0.0 - 0.2 % Final   Neutrophils Relative % 12/28/2020 62  % Final   Neutro Abs 12/28/2020 2.8  1.7 - 7.7 K/uL Final   Lymphocytes Relative 12/28/2020 28  % Final   Lymphs Abs 12/28/2020 1.3  0.7 - 4.0 K/uL Final   Monocytes Relative 12/28/2020 7  % Final   Monocytes Absolute 12/28/2020 0.3  0.1 - 1.0 K/uL Final   Eosinophils Relative 12/28/2020 2  % Final   Eosinophils Absolute 12/28/2020 0.1  0.0 - 0.5 K/uL Final   Basophils Relative 12/28/2020 1  % Final   Basophils Absolute 12/28/2020 0.0  0.0 - 0.1 K/uL Final   Immature Granulocytes 12/28/2020 0  % Final   Abs Immature Granulocytes 12/28/2020 0.01  0.00 - 0.07 K/uL Final   Performed at [facility], [address]  [place], [place] [address]   Total Protein, Urine 12/28/2020 <6  mg/dL Final   Comment: NO NORMAL RANGE ESTABLISHED FOR THIS TEST Performed at [facility], [address] [place], [place] [address]    CEA 12/28/2020 20.6 (A) 0.0 - 4.7 ng/mL Final   Comment: (NOTE)                             Nonsmokers          <3.9                             Smokers             <5.6 Roche [employer] Electrochemiluminescence Immunoassay (ECLIA) Values obtained with different assay methods or kits cannot be used interchangeably.  Results cannot be interpreted as absolute evidence of the presence or  absence of malignant disease. Performed At: [facility] [place], [place] [id number] Rush Farmer MD [id number]     Assessment and plan 1. Colon cancer metastasized to multiple sites ([place])   2. Encounter for antineoplastic chemotherapy   3. Anemia due to chemotherapy   4. Chemotherapy-induced neuropathy ([place])    #Metastatic colon cancer-recurrent Labs reviewed and discussed with patient. Proceed with cycle four 5-FU/bevacizumab. Plan to repeat imaging after 6 cycles.  CEA has trended down.  # Chemotherapy induced neuropathy Patient has supply of gabapentin and we discussed about instructions.  He may start if he needs to  #Chemotherapy-induced anemia, hemoglobin is 12.9.  monitor  # Constipation, recommend patient to take Colace 175m -2071mdaily. Symptom improved.   Follow-up 2 weeks [facility] assessment and treatment plan with the patient.  The patient was provided an opportunity to ask questions and all were answered.  The patient agreed with the plan and demonstrated an understanding of the instructions.  The patient was advised to call back if the symptoms worsen or if the condition fails to improve as anticipated.  ZhEarlie ServerMD, PhD Hematology Oncology [facility]/30/2022

## 2021-01-11 NOTE — Patient Instructions (Addendum)
[facility] ONCOLOGY   Discharge Instructions: Thank you for choosing [facility] to provide your oncology and hematology care.  If you have a lab appointment with the [facility], please go directly to the [facility] and check in at the registration area.  Wear comfortable clothing and clothing appropriate for easy access to any Portacath or PICC line.   We strive to give you quality time with your provider. You may need to reschedule your appointment if you arrive late (15 or more minutes).  Arriving late affects you and other patients whose appointments are after yours.  Also, if you miss three or more appointments without notifying the office, you may be dismissed from the clinic at the provider's discretion.      For prescription refill requests, have your pharmacy contact our office and allow 72 hours for refills to be completed.    Today you received the following chemotherapy and/or immunotherapy agents: Zirabev, Leucovorin, Fluorouracil.      To help prevent nausea and vomiting after your treatment, we encourage you to take your nausea medication as directed.  BELOW ARE SYMPTOMS THAT SHOULD BE REPORTED IMMEDIATELY: *FEVER GREATER THAN 100.4 F (38 C) OR HIGHER *CHILLS OR SWEATING *NAUSEA AND VOMITING THAT IS NOT CONTROLLED WITH YOUR NAUSEA MEDICATION *UNUSUAL SHORTNESS OF BREATH *UNUSUAL BRUISING OR BLEEDING *URINARY PROBLEMS (pain or burning when urinating, or frequent urination) *BOWEL PROBLEMS (unusual diarrhea, constipation, pain near the anus) TENDERNESS IN MOUTH AND THROAT WITH OR WITHOUT PRESENCE OF ULCERS (sore throat, sores in mouth, or a toothache) UNUSUAL RASH, SWELLING OR PAIN  UNUSUAL VAGINAL DISCHARGE OR ITCHING   Items with * indicate a potential emergency and should be followed up as soon as possible or go to the Emergency Department if any problems should occur.  Please show the CHEMOTHERAPY ALERT CARD or  IMMUNOTHERAPY ALERT CARD at check-in to the Emergency Department and triage nurse.  Should you have questions after your visit or need to cancel or reschedule your appointment, please contact [facility]  [phone] and follow the prompts.  Office hours are 8:00 a.m. to 4:30 p.m. Monday - Friday. Please note that voicemails left after 4:00 p.m. may not be returned until the following business day.  We are closed weekends and major holidays. You have access to a nurse at all times for urgent questions. Please call the main number to the clinic [phone] and follow the prompts.  For any non-urgent questions, you may also contact your provider using MyChart. We now offer e-Visits for anyone 64 and older to request care online for non-urgent symptoms. For details visit [email or web address].   Also download the MyChart app! Go to the app store, search "MyChart", open the app, select [facility], and log in with your MyChart username and password.  Due to Covid, a mask is required upon entering the hospital/clinic. If you do not have a mask, one will be given to you upon arrival. For doctor visits, patients may have 1 support person aged 69 or older with them. For treatment visits, patients cannot have anyone with them due to current Covid guidelines and our immunocompromised population.   The chemotherapy medication bag should finish at 46 hours, 96 hours, or 7 days. For example, if your pump is scheduled for 46 hours and it was put on at 4:00 p.m., it should finish at 2:00 p.m. the day it is scheduled to come off regardless of your appointment time.  Estimated time to finish at 01/13/2021 at 09:50am.  If the pump alarms go off prior to the pump reading "Low Volume" then call [phone] and someone can assist you.  If the plunger comes out and the chemotherapy medication is leaking out, please use your home chemo spill kit to clean up the spill. Do NOT use  paper towels or other household products.  If you have problems or questions regarding your pump, please call either [phone] (24 hours a day) or the cancer center Monday-Friday 8:00 a.m.- 4:30 p.m. at the clinic number and we will assist you. If you are unable to get assistance, then go to the nearest Emergency Department and ask the staff to contact the IV team for assistance.

## 2021-01-11 NOTE — Progress Notes (Signed)
Pt in for follow up with caregiver.  Pt denies any concerns today.

## 2021-01-12 LAB — CEA: CEA: 18 ng/mL — ABNORMAL HIGH (ref 0.0–4.7)

## 2021-01-13 ENCOUNTER — Inpatient Hospital Stay: Payer: Medicare Other | Attending: Oncology

## 2021-01-13 DIAGNOSIS — Z86010 Personal history of colonic polyps: Secondary | ICD-10-CM | POA: Insufficient documentation

## 2021-01-13 DIAGNOSIS — Z85038 Personal history of other malignant neoplasm of large intestine: Secondary | ICD-10-CM

## 2021-01-13 DIAGNOSIS — Z5112 Encounter for antineoplastic immunotherapy: Secondary | ICD-10-CM | POA: Diagnosis not present

## 2021-01-13 DIAGNOSIS — Z809 Family history of malignant neoplasm, unspecified: Secondary | ICD-10-CM | POA: Insufficient documentation

## 2021-01-13 DIAGNOSIS — Z836 Family history of other diseases of the respiratory system: Secondary | ICD-10-CM | POA: Diagnosis not present

## 2021-01-13 DIAGNOSIS — T451X5D Adverse effect of antineoplastic and immunosuppressive drugs, subsequent encounter: Secondary | ICD-10-CM | POA: Insufficient documentation

## 2021-01-13 DIAGNOSIS — Z5111 Encounter for antineoplastic chemotherapy: Secondary | ICD-10-CM | POA: Diagnosis present

## 2021-01-13 DIAGNOSIS — K5900 Constipation, unspecified: Secondary | ICD-10-CM | POA: Insufficient documentation

## 2021-01-13 DIAGNOSIS — Z803 Family history of malignant neoplasm of breast: Secondary | ICD-10-CM | POA: Diagnosis not present

## 2021-01-13 DIAGNOSIS — Z79899 Other long term (current) drug therapy: Secondary | ICD-10-CM | POA: Insufficient documentation

## 2021-01-13 DIAGNOSIS — C184 Malignant neoplasm of transverse colon: Secondary | ICD-10-CM | POA: Insufficient documentation

## 2021-01-13 DIAGNOSIS — G620 Drug-induced polyneuropathy: Secondary | ICD-10-CM | POA: Insufficient documentation

## 2021-01-13 DIAGNOSIS — C786 Secondary malignant neoplasm of retroperitoneum and peritoneum: Secondary | ICD-10-CM | POA: Diagnosis not present

## 2021-01-13 DIAGNOSIS — D6481 Anemia due to antineoplastic chemotherapy: Secondary | ICD-10-CM | POA: Insufficient documentation

## 2021-01-13 MED ORDER — HEPARIN SOD (PORK) LOCK FLUSH 100 UNIT/ML IV SOLN
500.0000 [IU] | Freq: Once | INTRAVENOUS | Status: AC | PRN
  Administered 2021-01-13: 500 [IU]
  Filled 2021-01-13: qty 5

## 2021-01-13 MED ORDER — SODIUM CHLORIDE 0.9% FLUSH
10.0000 mL | INTRAVENOUS | Status: DC | PRN
  Administered 2021-01-13: 10 mL
  Filled 2021-01-13: qty 10

## 2021-01-25 ENCOUNTER — Inpatient Hospital Stay: Payer: Medicare Other

## 2021-01-25 ENCOUNTER — Encounter: Payer: Self-pay | Admitting: Oncology

## 2021-01-25 ENCOUNTER — Inpatient Hospital Stay (HOSPITAL_BASED_OUTPATIENT_CLINIC_OR_DEPARTMENT_OTHER): Payer: Medicare Other | Admitting: Oncology

## 2021-01-25 VITALS — BP 130/77 | HR 71 | Temp 97.4°F | Resp 18 | Wt 271.8 lb

## 2021-01-25 DIAGNOSIS — Z85038 Personal history of other malignant neoplasm of large intestine: Secondary | ICD-10-CM

## 2021-01-25 DIAGNOSIS — C189 Malignant neoplasm of colon, unspecified: Secondary | ICD-10-CM

## 2021-01-25 DIAGNOSIS — T451X5A Adverse effect of antineoplastic and immunosuppressive drugs, initial encounter: Secondary | ICD-10-CM

## 2021-01-25 DIAGNOSIS — D6481 Anemia due to antineoplastic chemotherapy: Secondary | ICD-10-CM | POA: Diagnosis not present

## 2021-01-25 DIAGNOSIS — C786 Secondary malignant neoplasm of retroperitoneum and peritoneum: Secondary | ICD-10-CM

## 2021-01-25 DIAGNOSIS — Z5111 Encounter for antineoplastic chemotherapy: Secondary | ICD-10-CM

## 2021-01-25 DIAGNOSIS — C184 Malignant neoplasm of transverse colon: Secondary | ICD-10-CM | POA: Diagnosis not present

## 2021-01-25 DIAGNOSIS — Z5112 Encounter for antineoplastic immunotherapy: Secondary | ICD-10-CM | POA: Diagnosis not present

## 2021-01-25 LAB — CBC WITH DIFFERENTIAL/PLATELET
Abs Immature Granulocytes: 0.01 10*3/uL (ref 0.00–0.07)
Basophils Absolute: 0 10*3/uL (ref 0.0–0.1)
Basophils Relative: 1 %
Eosinophils Absolute: 0.1 10*3/uL (ref 0.0–0.5)
Eosinophils Relative: 2 %
HCT: 38.8 % — ABNORMAL LOW (ref 39.0–52.0)
Hemoglobin: 13 g/dL (ref 13.0–17.0)
Immature Granulocytes: 0 %
Lymphocytes Relative: 32 %
Lymphs Abs: 1.5 10*3/uL (ref 0.7–4.0)
MCH: 31.9 pg (ref 26.0–34.0)
MCHC: 33.5 g/dL (ref 30.0–36.0)
MCV: 95.3 fL (ref 80.0–100.0)
Monocytes Absolute: 0.4 10*3/uL (ref 0.1–1.0)
Monocytes Relative: 8 %
Neutro Abs: 2.7 10*3/uL (ref 1.7–7.7)
Neutrophils Relative %: 57 %
Platelets: 170 10*3/uL (ref 150–400)
RBC: 4.07 MIL/uL — ABNORMAL LOW (ref 4.22–5.81)
RDW: 13.1 % (ref 11.5–15.5)
WBC: 4.7 10*3/uL (ref 4.0–10.5)
nRBC: 0 % (ref 0.0–0.2)

## 2021-01-25 LAB — COMPREHENSIVE METABOLIC PANEL
ALT: 38 U/L (ref 0–44)
AST: 49 U/L — ABNORMAL HIGH (ref 15–41)
Albumin: 3.6 g/dL (ref 3.5–5.0)
Alkaline Phosphatase: 49 U/L (ref 38–126)
Anion gap: 8 (ref 5–15)
BUN: 9 mg/dL (ref 8–23)
CO2: 26 mmol/L (ref 22–32)
Calcium: 9.1 mg/dL (ref 8.9–10.3)
Chloride: 102 mmol/L (ref 98–111)
Creatinine, Ser: 0.72 mg/dL (ref 0.61–1.24)
GFR, Estimated: >60 mL/min (ref >60)
Glucose, Bld: 151 mg/dL — ABNORMAL HIGH (ref 70–99)
Potassium: 3.8 mmol/L (ref 3.5–5.1)
Sodium: 136 mmol/L (ref 135–145)
Total Bilirubin: 0.9 mg/dL (ref 0.3–1.2)
Total Protein: 6.6 g/dL (ref 6.5–8.1)

## 2021-01-25 LAB — CEA: CEA: 15.8 ng/mL — ABNORMAL HIGH (ref 0.0–4.7)

## 2021-01-25 LAB — PROTEIN, URINE, RANDOM: Total Protein, Urine: 19 mg/dL

## 2021-01-25 MED ORDER — SODIUM CHLORIDE 0.9 % IV SOLN
10.0000 mg | Freq: Once | INTRAVENOUS | Status: AC
  Administered 2021-01-25: 10 mg via INTRAVENOUS
  Filled 2021-01-25: qty 10

## 2021-01-25 MED ORDER — SODIUM CHLORIDE 0.9 % IV SOLN
Freq: Once | INTRAVENOUS | Status: AC
  Filled 2021-01-25: qty 250

## 2021-01-25 MED ORDER — LEUCOVORIN CALCIUM INJECTION 350 MG
950.0000 mg | Freq: Once | INTRAMUSCULAR | Status: AC
  Administered 2021-01-25: 950 mg via INTRAVENOUS
  Filled 2021-01-25: qty 47.5

## 2021-01-25 MED ORDER — FLUOROURACIL CHEMO INJECTION 2.5 GM/50ML
400.0000 mg/m2 | Freq: Once | INTRAVENOUS | Status: AC
  Administered 2021-01-25: 950 mg via INTRAVENOUS
  Filled 2021-01-25: qty 19

## 2021-01-25 MED ORDER — SODIUM CHLORIDE 0.9 % IV SOLN
2400.0000 mg/m2 | INTRAVENOUS | Status: DC
  Administered 2021-01-25: 5800 mg via INTRAVENOUS
  Filled 2021-01-25: qty 116

## 2021-01-25 MED ORDER — SODIUM CHLORIDE 0.9 % IV SOLN
5.0000 mg/kg | Freq: Once | INTRAVENOUS | Status: AC
  Administered 2021-01-25: 600 mg via INTRAVENOUS
  Filled 2021-01-25: qty 8

## 2021-01-25 MED ORDER — SODIUM CHLORIDE 0.9% FLUSH
10.0000 mL | Freq: Once | INTRAVENOUS | Status: AC
  Administered 2021-01-25: 10 mL via INTRAVENOUS
  Filled 2021-01-25: qty 10

## 2021-01-25 NOTE — Progress Notes (Signed)
[facility]  [address] [place], [place] [address] Phone: [phone]  Fax: [phone]   Clinic Day: 01/25/2021   Referring physician: Tracie Harrier, MD  Chief Complaint: Jared Gutierrez is a 80 y.o. male presents for follow-up of metastatic colon cancer   PERTINENT ONCOLOGY HISTORY Jared Gutierrez is a 80 y.o.[place] who has above oncology history reviewed by me today presented for follow up visit for management of  Metastatic colon cancer. [name], patient switched care to me on 11/11/20 Extensive medical record review was performed by me  # Metastatic colon cancer.  he was diagnosed with stage I colon cancer s/p transverse colectomy on 10/17/2013.  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  Tumor extended into the submucosa. Margins were negative. + lymphovascular invasion. 14 lymph nodes were negative. Pathologic stage was T1 N0.   10/02/2014 Colonoscopy showed several 3 mm polyps and an 8 mm polyp in the cecum and transverse colon.  Pathology revealed tubular adenomas negative for high-grade dysplasia and malignancy.  02/16/2017 Colonoscopy  revealed 2 diminutive polyps in the descending colon.  Pathology revealed tubular adenomas without dysplasia or malignancy.  Her CEA has been monitored and was noted to increase starting July 2020. 04/02/2019  PET scan  limited evealed a 2.4 x 2.3 cm (SUV 11) hypermetabolic soft tissue density caudal and anterior to the pancreatic neck favored to represent isolated peritoneal or nodal metastasis in the setting of prior transverse colonic resection (expected primary drainage). Although this was immediately adjacent to the pancreas, a fat plane was maintained, arguing strongly against a pancreatic primary. Otherwise, there was no evidence of hypermetabolic metastasis.    04/17/2019 EUS on  revealed a normal esophagus, stomach, duodenum, and pancreas.  There was a 2.4 x 2.4 cm irregular mass in the retroperitoneum adjacent to, but not involving the pancreatic neck.  FNA and core needle biopsy were performed.  Pathology revealed adenocarcinoma compatible with a metastatic lesion of colorectal origin.  Tumor cells were positive for CK20 and CDX2 and negative for CK7.   NGS: [employer] on 05/15/2019 revealed + KRASG13D and TP53.  Negative results included BRAF V600E, Her2, NRAS, NTRK, PD-L1 (<1%), and TMB 8.7/Mb (intermediate).  MMR testing from his colon resection on 10/16/2013 was intact with a low probability of MSI-H.  06/25/2019, CEA 17.8. 07/09/2019 - 08/27/2019; 09/17/2019 - 10/15/2019; 11/12/2019 - 12/17/2019 He received 11 cycles of FOLFOX chemotherapy and 1 cycle of 5-FU and leucovorin (12/31/2019).  He received Neulasta after cycle #4 and #5 secondary to progressive leukopenia.  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  He received a truncated course of FOLFOX with cycle #11 secondary to oxaliplatin reaction.  08/27/2019, CEA 6.1 10/29/2019, CEA 7.7 12/31/2019, CEA 10.8.  01/14/2020 PET revealed an interval decrease in size and FDG uptake (2.4 x 2.3 cm with SUV 11 to 2.4 x 1.7 cm with SUV 4.09) associated with the previously referenced soft tissue density caudal and anterior to the pancreatic neck suggesting treatment response. There were no new sites of FDG avid tumor.  Radiation treatment 02/10/2020 - 02/23/2020.   He received a hybrid 3-dimensional course of 3000 cGy in 10 fractions to the residual FDG avid mass   05/27/2020, CEA 11.6 05/27/2020 Abdomen and pelvis CT :stable to minimally increased (2.9 x 1.8 cm compared to 2.4 x 1.7 cm) since 01/14/2020.  There were no new interval findings.  08/31/2020, CEA 30.9. 08/31/2020 Abdomen and pelvis CT revealed increased size of the index soft tissue lesion inferior and anterior to the pancreatic neck  (2.9 x 1.8 cm to 3.5 x 2.9 cm). There were similar prominent retroperitoneal lymph nodes without adenopathy by size criteria. There was no new or enlarging abdominal or pelvic lymph nodes. There were no new interval findings. There was similar circumferential wall thickening of a nondistended urinary bladder, which likely accentuated wall thickening There was hepatic steatosis and aortic atherosclerosis.  11/03/2020, PET showed recurrent peritoneal metastasis in the upper abdomen adjacent to the pancreas.  The lesion is 3.8 x 2.9 cm with SUV of 11.3. No evidence of metastatic peritoneal disease elsewhere in the abdomen pelvis.  No evidence of distant metastasis.  Other medical problems Chronic lower extremity edema. 12/24/2018, right lower extremity duplex negative for DVT.  Small right Baker's cyst. 08/16/2019, bilateral lower extremity duplex showed no DVT.  08/16/2019 - 08/17/2019 with right lower extremity cellulitis.  He was unable to bear weight.  He was treated with IV fluids, NSAIDs, colchicine, and broad antibiotics (vancomycin and Cefepime).  He was discharged on indomethacin x 5 days and Keflex 500 mg TID x 5 days.  10/05/2020, colonoscopy showed internal hemorrhoids.  Otherwise normal examination. 11/03/2020 PET scan showed recurrent peritoneal metastasis near pancreas. Given that he has no other distant metastasis.  Repeat SBRT may be considered if he does not respond well to systemic chemotherapy.  Discussed with radiation oncology.  11/26/2020 patient was started on 5-FU and bevacizumab. 12/14/2020 CEA 29.5  INTERVAL HISTORY Jared Gutierrez is a 80 y.o. male who has above history reviewed by me today presents for follow up visit for management of recurrent metastatic colon cancer Problems and complaints are listed below: he was accompanied by wife Patient tolerated  5-FU and bevacizumab.  Patient denies any abdominal pain, nausea vomiting or diarrhea.  No new complaints  today. 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  Patient was given gabapentin which he is not taking currently due to the concern of possible side effects.  Review of Systems  Constitutional:  Negative for appetite change, chills, diaphoresis, fever and unexpected weight change.  HENT:   Negative for hearing loss, nosebleeds, sore throat and tinnitus.   Respiratory:  Positive for shortness of breath. Negative for cough and hemoptysis.   Cardiovascular:  Positive for leg swelling. Negative for chest pain and palpitations.  Gastrointestinal:  Negative for abdominal pain, blood in stool, constipation, diarrhea, nausea and vomiting.  Genitourinary:  Negative for dysuria, frequency and hematuria.   Musculoskeletal:  Negative for back pain, myalgias and neck pain.  Skin:  Negative for itching and rash.  Neurological:  Negative for dizziness and headaches.  Hematological:  Does not bruise/bleed easily.  Psychiatric/Behavioral:  Negative for depression. The patient is not nervous/anxious.      Past Medical History:  Diagnosis Date   Arthritis    OSTEOARTHRITIS   Cancer ([place])    Cavitary lesion of lung    RIGHT LOWER LOBE   Chicken pox    Colon cancer ([place])    History of kidney stones    Hypertension    Lipoma of colon    Nephrolithiasis    Nephrolithiasis    Obesity    Shingles    Tubular adenoma of colon    multiple fragments    Past Surgical History:  Procedure Laterality Date   COLON SURGERY     COLONOSCOPY N/A 10/02/2014   Procedure:  COLONOSCOPY;  Surgeon: Josefine Class, MD;  Location: [facility] ENDOSCOPY;  Service: Endoscopy;  Laterality: N/A;   COLONOSCOPY WITH PROPOFOL N/A 02/16/2017   Procedure: COLONOSCOPY WITH PROPOFOL;  Surgeon: Manya Silvas, MD;  Location: [facility] ENDOSCOPY;  Service: Endoscopy;  Laterality: N/A;   COLONOSCOPY WITH PROPOFOL N/A 10/05/2020   Procedure: COLONOSCOPY WITH PROPOFOL;  Surgeon: Lesly Rubenstein, MD;  Location:  [facility] ENDOSCOPY;  Service: Endoscopy;  Laterality: N/A;   EUS N/A 04/17/2019   Procedure: FULL UPPER ENDOSCOPIC ULTRASOUND (EUS) RADIAL;  Surgeon: Holly Bodily, MD;  Location: [facility] ENDOSCOPY;  Service: Gastroenterology;  Laterality: N/A;   KIDNEY STONE SURGERY     PARTIAL COLECTOMY  10/17/2013   PORTACATH PLACEMENT Right 06/13/2019   Procedure: INSERTION PORT-A-CATH;  Surgeon: Benjamine Sprague, DO;  Location: [facility] ORS;  Service: General;  Laterality: Right;    Family History  Problem Relation Age of Onset   Cancer Mother    Breast cancer Mother    COPD Father     Social History:  reports that he has never smoked. He has never used smokeless tobacco. He reports current alcohol use. He reports that he does not use drugs. 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 He is married and his wife's name is [name] [phone]).  The patient is alone today.  Allergies: No Known Allergies  Current Medications: Current Outpatient Medications  Medication Sig Dispense Refill   acetaminophen (TYLENOL) 500 MG tablet Take 500 mg by mouth every 6 (six) hours as needed.     amLODipine (NORVASC) 2.5 MG tablet Take 2.5 mg by mouth daily.     aspirin EC 81 MG tablet Take 81 mg by mouth daily.     Cholecalciferol (VITAMIN D) 125 MCG (5000 UT) CAPS Take 5,000 mg by mouth.     docusate sodium (COLACE) 100 MG capsule Take 100 mg by mouth 2 (two) times daily.     HYDROcodone-acetaminophen (NORCO/VICODIN) 5-325 MG tablet Take 1 tablet by mouth every 6 (six) hours as needed for moderate pain (up to 3 doses for moderate pain.).     lidocaine-prilocaine (EMLA) cream Apply to affected area once 30 g 3   loratadine (CLARITIN) 10 MG tablet Take 10 mg by mouth daily.     olmesartan (BENICAR) 20 MG tablet Take 1 tablet by mouth daily.     ondansetron (ZOFRAN) 8 MG tablet Take 1 tablet (8 mg total) by mouth 2 (two) times daily as needed for refractory nausea / vomiting. Start on day 3 after  chemotherapy. 60 tablet 1   Probiotic Product (PROBIOTIC DAILY PO) Take 1 tablet by mouth daily.     prochlorperazine (COMPAZINE) 10 MG tablet Take 1 tablet (10 mg total) by mouth every 6 (six) hours as needed (NAUSEA). 30 tablet 1   gabapentin (NEURONTIN) 100 MG capsule Take 1 capsule (100 mg total) by mouth 3 (three) times daily. (Patient not taking: No sig reported) 90 capsule 0   NEOMYCIN-POLYMYXIN-HYDROCORTISONE (CORTISPORIN) 1 % SOLN OTIC solution SMARTSIG:Left Ear (Patient not taking: No sig reported)     potassium chloride SA (KLOR-CON) 20 MEQ tablet Take by mouth. (Patient not taking: No sig reported)     No current facility-administered medications for this visit.   Facility-Administered Medications Ordered in Other Visits  Medication Dose Route Frequency Provider Last Rate Last Admin   0.9 %  sodium chloride infusion   Intravenous Once Lequita Asal, MD  0.9 %  sodium chloride infusion   Intravenous Continuous Nolon Stalls C, MD 10 mL/hr at 12/31/19 1000 New Bag at 10/05/20 1045   fluorouracil (ADRUCIL) 5,800 mg in sodium chloride 0.9 % 134 mL chemo infusion  2,400 mg/m2 (Order-Specific) Intravenous 1 day or 1 dose [name], MD   5,800 mg at 01/25/21 1220   heparin lock flush 100 unit/mL  500 Units Intravenous Once Lequita Asal, MD        Performance status (ECOG): 1  Vitals Blood pressure 130/77, pulse 71, temperature (!) 97.4 F (36.3 C), resp. rate 18, weight 271 lb 12.8 oz (123.3 kg).   Physical Exam Vitals and nursing note reviewed.  Constitutional:      General: He is not in acute distress.    Appearance: He is well-developed. He is obese. He is not diaphoretic.     Interventions: Face mask in place.     Comments: Patient walks with a cane  HENT:     Head: Normocephalic and atraumatic.     Mouth/Throat:     Mouth: Mucous membranes are moist.     Pharynx: Oropharynx is clear.  Eyes:     General: No scleral icterus.    Extraocular Movements:  Extraocular movements intact.     Conjunctiva/sclera: Conjunctivae normal.     Pupils: Pupils are equal, round, and reactive to light.  Cardiovascular:     Rate and Rhythm: Normal rate and regular rhythm.     Heart sounds: Normal heart sounds. No murmur heard. Pulmonary:     Effort: Pulmonary effort is normal. No respiratory distress.     Breath sounds: No wheezing or rales.     Comments: Decreased breath sound bilaterally Chest:     Chest wall: No tenderness.  Abdominal:     General: Bowel sounds are normal. There is no distension.     Palpations: Abdomen is soft. There is no mass.     Tenderness: There is no abdominal tenderness. There is no guarding or rebound.  Musculoskeletal:        General: No swelling or tenderness. Normal range of motion.     Cervical back: Normal range of motion and neck supple.     Right lower leg: Edema present.     Left lower leg: Edema present.  Lymphadenopathy:     Head:     Right side of head: No preauricular, posterior auricular or occipital adenopathy.     Left side of head: No preauricular, posterior auricular or occipital adenopathy.     Cervical: No cervical adenopathy.     Upper Body:     Right upper body: No supraclavicular or axillary adenopathy.     Left upper body: No supraclavicular or axillary adenopathy.     Lower Body: No right inguinal adenopathy. No left inguinal adenopathy.  Skin:    General: Skin is warm and dry.  Neurological:     General: No focal deficit present.     Mental Status: He is alert and oriented to person, place, and time. Mental status is at baseline.  Psychiatric:        Mood and Affect: Mood normal.   Infusion on 01/25/2021  Component Date Value Ref Range Status   Sodium 01/25/2021 136  135 - 145 mmol/L Final   Potassium 01/25/2021 3.8  3.5 - 5.1 mmol/L Final   Chloride 01/25/2021 102  98 - 111 mmol/L Final   CO2 01/25/2021 26  22 - 32 mmol/L Final   Glucose,  Bld 01/25/2021 151 (A) 70 - 99 mg/dL Final    Glucose reference range applies only to samples taken after fasting for at least 8 hours.   BUN 01/25/2021 9  8 - 23 mg/dL Final   Creatinine, Ser 01/25/2021 0.72  0.61 - 1.24 mg/dL Final   Calcium 01/25/2021 9.1  8.9 - 10.3 mg/dL Final   Total Protein 01/25/2021 6.6  6.5 - 8.1 g/dL Final   Albumin 01/25/2021 3.6  3.5 - 5.0 g/dL Final   AST 01/25/2021 49 (A) 15 - 41 U/L Final   ALT 01/25/2021 38  0 - 44 U/L Final   Alkaline Phosphatase 01/25/2021 49  38 - 126 U/L Final   Total Bilirubin 01/25/2021 0.9  0.3 - 1.2 mg/dL Final   GFR, Estimated 01/25/2021 >60  >60 mL/min Final   Comment: (NOTE) Calculated using the CKD-EPI Creatinine Equation (2021)    Anion gap 01/25/2021 8  5 - 15 Final   Performed at [facility], [place], [place] [address]   WBC 01/25/2021 4.7  4.0 - 10.5 K/uL Final   RBC 01/25/2021 4.07 (A) 4.22 - 5.81 MIL/uL Final   Hemoglobin 01/25/2021 13.0  13.0 - 17.0 g/dL Final   HCT 01/25/2021 38.8 (A) 39.0 - 52.0 % Final   MCV 01/25/2021 95.3  80.0 - 100.0 fL Final   MCH 01/25/2021 31.9  26.0 - 34.0 pg Final   MCHC 01/25/2021 33.5  30.0 - 36.0 g/dL Final   RDW 01/25/2021 13.1  11.5 - 15.5 % Final   Platelets 01/25/2021 170  150 - 400 K/uL Final   nRBC 01/25/2021 0.0  0.0 - 0.2 % Final   Neutrophils Relative % 01/25/2021 57  % Final   Neutro Abs 01/25/2021 2.7  1.7 - 7.7 K/uL Final   Lymphocytes Relative 01/25/2021 32  % Final   Lymphs Abs 01/25/2021 1.5  0.7 - 4.0 K/uL Final   Monocytes Relative 01/25/2021 8  % Final   Monocytes Absolute 01/25/2021 0.4  0.1 - 1.0 K/uL Final   Eosinophils Relative 01/25/2021 2  % Final   Eosinophils Absolute 01/25/2021 0.1  0.0 - 0.5 K/uL Final   Basophils Relative 01/25/2021 1  % Final   Basophils Absolute 01/25/2021 0.0  0.0 - 0.1 K/uL Final   Immature Granulocytes 01/25/2021 0  % Final   Abs Immature Granulocytes 01/25/2021 0.01  0.00 - 0.07 K/uL Final   Performed at [facility], [address]  [place], [place] [address]   Total Protein, Urine 01/25/2021 19  mg/dL Final   Comment: NO NORMAL RANGE ESTABLISHED FOR THIS TEST Performed at [facility], [address], [place], [place] [address]     Assessment and plan 1. 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 Encounter for antineoplastic chemotherapy   3. Anemia due to chemotherapy   4. Cancer of transverse colon [facility])    #Metastatic colon cancer-recurrent Labs reviewed and discussed with patient. Proceed with cycle five 5-FU/bevacizumab.  Plan to repeat imaging after 6 cycles CEA has trended down.   # Chemotherapy induced neuropathy Patient has supply of gabapentin and we discussed about instructions.  He may start if he needs to  #Chemotherapy-induced anemia, hemoglobin is stable monitor  # Constipation, recommend patient to take Colace 156m -2020mdaily. Symptom improved.   [facility] 2 [facility] and treatment plan with the patient.  The patient was provided an opportunity to ask  questions and all were answered.  The patient agreed with the plan and demonstrated an understanding of the instructions.  The patient was advised to call back if the symptoms worsen or if the condition fails to improve as anticipated.  [name], MD, PhD Hematology Oncology [place] [facility] 01/25/2021

## 2021-01-25 NOTE — Progress Notes (Signed)
Patient here for follow up. Pt reports no new concerns today.

## 2021-01-27 ENCOUNTER — Other Ambulatory Visit: Payer: Self-pay

## 2021-01-27 ENCOUNTER — Inpatient Hospital Stay: Payer: Medicare Other

## 2021-01-27 DIAGNOSIS — Z5112 Encounter for antineoplastic immunotherapy: Secondary | ICD-10-CM | POA: Diagnosis not present

## 2021-01-27 DIAGNOSIS — Z85038 Personal history of other malignant neoplasm of large intestine: Secondary | ICD-10-CM

## 2021-01-27 DIAGNOSIS — C786 Secondary malignant neoplasm of retroperitoneum and peritoneum: Secondary | ICD-10-CM

## 2021-01-27 MED ORDER — HEPARIN SOD (PORK) LOCK FLUSH 100 UNIT/ML IV SOLN
INTRAVENOUS | Status: AC
  Administered 2021-01-27: 500 [IU]
  Filled 2021-01-27: qty 5

## 2021-01-27 MED ORDER — HEPARIN SOD (PORK) LOCK FLUSH 100 UNIT/ML IV SOLN
500.0000 [IU] | Freq: Once | INTRAVENOUS | Status: AC | PRN
  Filled 2021-01-27: qty 5

## 2021-01-27 MED ORDER — SODIUM CHLORIDE 0.9% FLUSH
10.0000 mL | INTRAVENOUS | Status: DC | PRN
  Administered 2021-01-27: 10 mL
  Filled 2021-01-27: qty 10

## 2021-02-08 ENCOUNTER — Inpatient Hospital Stay: Payer: Medicare Other

## 2021-02-08 ENCOUNTER — Inpatient Hospital Stay (HOSPITAL_BASED_OUTPATIENT_CLINIC_OR_DEPARTMENT_OTHER): Payer: Medicare Other | Admitting: Oncology

## 2021-02-08 ENCOUNTER — Encounter: Payer: Self-pay | Admitting: Oncology

## 2021-02-08 VITALS — BP 150/104 | HR 79 | Temp 97.8°F | Resp 20 | Wt 273.1 lb

## 2021-02-08 VITALS — BP 143/79 | HR 69

## 2021-02-08 DIAGNOSIS — C189 Malignant neoplasm of colon, unspecified: Secondary | ICD-10-CM | POA: Diagnosis not present

## 2021-02-08 DIAGNOSIS — C786 Secondary malignant neoplasm of retroperitoneum and peritoneum: Secondary | ICD-10-CM

## 2021-02-08 DIAGNOSIS — Z5111 Encounter for antineoplastic chemotherapy: Secondary | ICD-10-CM

## 2021-02-08 DIAGNOSIS — T451X5A Adverse effect of antineoplastic and immunosuppressive drugs, initial encounter: Secondary | ICD-10-CM | POA: Diagnosis not present

## 2021-02-08 DIAGNOSIS — D6481 Anemia due to antineoplastic chemotherapy: Secondary | ICD-10-CM

## 2021-02-08 DIAGNOSIS — Z79899 Other long term (current) drug therapy: Secondary | ICD-10-CM

## 2021-02-08 DIAGNOSIS — Z5112 Encounter for antineoplastic immunotherapy: Secondary | ICD-10-CM | POA: Diagnosis not present

## 2021-02-08 DIAGNOSIS — Z85038 Personal history of other malignant neoplasm of large intestine: Secondary | ICD-10-CM

## 2021-02-08 DIAGNOSIS — T451X5D Adverse effect of antineoplastic and immunosuppressive drugs, subsequent encounter: Secondary | ICD-10-CM

## 2021-02-08 DIAGNOSIS — G620 Drug-induced polyneuropathy: Secondary | ICD-10-CM

## 2021-02-08 DIAGNOSIS — K5900 Constipation, unspecified: Secondary | ICD-10-CM

## 2021-02-08 LAB — CBC WITH DIFFERENTIAL/PLATELET
Abs Immature Granulocytes: 0.01 10*3/uL (ref 0.00–0.07)
Basophils Absolute: 0 10*3/uL (ref 0.0–0.1)
Basophils Relative: 1 %
Eosinophils Absolute: 0.1 10*3/uL (ref 0.0–0.5)
Eosinophils Relative: 2 %
HCT: 37.9 % — ABNORMAL LOW (ref 39.0–52.0)
Hemoglobin: 12.8 g/dL — ABNORMAL LOW (ref 13.0–17.0)
Immature Granulocytes: 0 %
Lymphocytes Relative: 28 %
Lymphs Abs: 1.5 10*3/uL (ref 0.7–4.0)
MCH: 32.2 pg (ref 26.0–34.0)
MCHC: 33.8 g/dL (ref 30.0–36.0)
MCV: 95.2 fL (ref 80.0–100.0)
Monocytes Absolute: 0.4 10*3/uL (ref 0.1–1.0)
Monocytes Relative: 8 %
Neutro Abs: 3.4 10*3/uL (ref 1.7–7.7)
Neutrophils Relative %: 61 %
Platelets: 163 10*3/uL (ref 150–400)
RBC: 3.98 MIL/uL — ABNORMAL LOW (ref 4.22–5.81)
RDW: 13.4 % (ref 11.5–15.5)
WBC: 5.5 10*3/uL (ref 4.0–10.5)
nRBC: 0 % (ref 0.0–0.2)

## 2021-02-08 LAB — COMPREHENSIVE METABOLIC PANEL
ALT: 36 U/L (ref 0–44)
AST: 47 U/L — ABNORMAL HIGH (ref 15–41)
Albumin: 3.6 g/dL (ref 3.5–5.0)
Alkaline Phosphatase: 45 U/L (ref 38–126)
Anion gap: 9 (ref 5–15)
BUN: 10 mg/dL (ref 8–23)
CO2: 25 mmol/L (ref 22–32)
Calcium: 9.2 mg/dL (ref 8.9–10.3)
Chloride: 105 mmol/L (ref 98–111)
Creatinine, Ser: 0.71 mg/dL (ref 0.61–1.24)
GFR, Estimated: >60 mL/min (ref >60)
Glucose, Bld: 132 mg/dL — ABNORMAL HIGH (ref 70–99)
Potassium: 3.5 mmol/L (ref 3.5–5.1)
Sodium: 139 mmol/L (ref 135–145)
Total Bilirubin: 1.1 mg/dL (ref 0.3–1.2)
Total Protein: 6.6 g/dL (ref 6.5–8.1)

## 2021-02-08 LAB — PROTEIN, URINE, RANDOM: Total Protein, Urine: 14 mg/dL

## 2021-02-08 MED ORDER — SODIUM CHLORIDE 0.9% FLUSH
10.0000 mL | Freq: Once | INTRAVENOUS | Status: AC
  Administered 2021-02-08: 10 mL via INTRAVENOUS
  Filled 2021-02-08: qty 10

## 2021-02-08 MED ORDER — SODIUM CHLORIDE 0.9 % IV SOLN
Freq: Once | INTRAVENOUS | Status: AC
  Filled 2021-02-08: qty 250

## 2021-02-08 MED ORDER — SODIUM CHLORIDE 0.9 % IV SOLN
5.0000 mg/kg | Freq: Once | INTRAVENOUS | Status: AC
  Administered 2021-02-08: 600 mg via INTRAVENOUS
  Filled 2021-02-08: qty 24

## 2021-02-08 MED ORDER — SODIUM CHLORIDE 0.9 % IV SOLN
391.0000 mg/m2 | Freq: Once | INTRAVENOUS | Status: AC
  Administered 2021-02-08: 950 mg via INTRAVENOUS
  Filled 2021-02-08: qty 47.5

## 2021-02-08 MED ORDER — SODIUM CHLORIDE 0.9 % IV SOLN
2400.0000 mg/m2 | INTRAVENOUS | Status: DC
  Administered 2021-02-08: 5800 mg via INTRAVENOUS
  Filled 2021-02-08: qty 116

## 2021-02-08 MED ORDER — FLUOROURACIL CHEMO INJECTION 2.5 GM/50ML
400.0000 mg/m2 | Freq: Once | INTRAVENOUS | Status: AC
  Administered 2021-02-08: 950 mg via INTRAVENOUS
  Filled 2021-02-08: qty 19

## 2021-02-08 MED ORDER — SODIUM CHLORIDE 0.9 % IV SOLN
10.0000 mg | Freq: Once | INTRAVENOUS | Status: AC
  Administered 2021-02-08: 10 mg via INTRAVENOUS
  Filled 2021-02-08: qty 10

## 2021-02-08 NOTE — Progress Notes (Signed)
Hematology Oncology Progress Note   Clinic Day: 02/08/2021   Referring physician: Tracie Harrier, MD  Chief Complaint: Jared Gutierrez is a 80 y.o. male presents for follow-up of metastatic colon cancer   PERTINENT ONCOLOGY HISTORY Jared Gutierrez is a 80 y.o.[place] who has above oncology history reviewed by me today presented for follow up visit for management of  Metastatic colon cancer. [name], patient switched care to me on 11/11/20 Extensive medical record review was performed by me  # Metastatic colon cancer.  he was diagnosed with stage I colon cancer s/p transverse colectomy on 10/17/2013.  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  Tumor extended into the submucosa. Margins were negative. + lymphovascular invasion. 14 lymph nodes were negative. Pathologic stage was T1 N0.   10/02/2014 Colonoscopy showed several 3 mm polyps and an 8 mm polyp in the cecum and transverse colon.  Pathology revealed tubular adenomas negative for high-grade dysplasia and malignancy.  02/16/2017 Colonoscopy  revealed 2 diminutive polyps in the descending colon.  Pathology revealed tubular adenomas without dysplasia or malignancy.  Her CEA has been monitored and was noted to increase starting July 2020. 04/02/2019  PET scan  limited evealed a 2.4 x 2.3 cm (SUV 11) hypermetabolic soft tissue density caudal and anterior to the pancreatic neck favored to represent isolated peritoneal or nodal metastasis in the setting of prior transverse colonic resection (expected primary drainage). Although this was immediately adjacent to the pancreas, a fat plane was maintained, arguing strongly against a pancreatic primary. Otherwise, there was no evidence of hypermetabolic metastasis.   04/17/2019 EUS on  revealed a normal esophagus, stomach, duodenum, and pancreas.  There was a  2.4 x 2.4 cm irregular mass in the retroperitoneum adjacent to, but not involving the pancreatic neck.  FNA and core needle biopsy were performed.  Pathology revealed adenocarcinoma compatible with a metastatic lesion of colorectal origin.  Tumor cells were positive for CK20 and CDX2 and negative for CK7.   NGS: [employer] on 05/15/2019 revealed + KRASG13D and TP53.  Negative results included BRAF V600E, Her2, NRAS, NTRK, PD-L1 (<1%), and TMB 8.7/Mb (intermediate).  MMR testing from his colon resection on 10/16/2013 was intact with a low probability of MSI-H.  06/25/2019, CEA 17.8. 07/09/2019 - 08/27/2019; 09/17/2019 - 10/15/2019; 11/12/2019 - 12/17/2019 He received 11 cycles of FOLFOX chemotherapy and 1 cycle of 5-FU and leucovorin (12/31/2019).  He received Neulasta after cycle #4 and #5 secondary to progressive leukopenia.  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  He received a truncated course of FOLFOX with cycle #11 secondary to oxaliplatin reaction.  08/27/2019, CEA 6.1 10/29/2019, CEA 7.7 12/31/2019, CEA 10.8.  01/14/2020 PET revealed an interval decrease in size and FDG uptake (2.4 x 2.3 cm with SUV 11 to 2.4 x 1.7 cm with SUV 4.09) associated with the previously referenced soft tissue density caudal and anterior to the pancreatic neck suggesting treatment response. There were no new sites of FDG avid tumor.  Radiation treatment 02/10/2020 - 02/23/2020.   He received a hybrid 3-dimensional course of 3000 cGy in 10 fractions to the residual FDG avid mass   05/27/2020, CEA 11.6 05/27/2020 Abdomen and pelvis CT :stable to minimally increased (2.9 x 1.8 cm compared to 2.4 x 1.7 cm) since 01/14/2020.  There were no new interval findings.    08/31/2020, CEA 30.9. 08/31/2020 Abdomen and pelvis CT revealed increased size of  the index soft tissue lesion inferior and anterior to the pancreatic neck (2.9 x 1.8 cm to 3.5 x 2.9 cm). There were similar prominent retroperitoneal lymph nodes without  adenopathy by size criteria. There was no new or enlarging abdominal or pelvic lymph nodes. There were no new interval findings. There was similar circumferential wall thickening of a nondistended urinary bladder, which likely accentuated wall thickening There was hepatic steatosis and aortic atherosclerosis.  11/03/2020, PET showed recurrent peritoneal metastasis in the upper abdomen adjacent to the pancreas.  The lesion is 3.8 x 2.9 cm with SUV of 11.3. No evidence of metastatic peritoneal disease elsewhere in the abdomen pelvis.  No evidence of distant metastasis.  Other medical problems Chronic lower extremity edema. 12/24/2018, right lower extremity duplex negative for DVT.  Small right Baker's cyst. 08/16/2019, bilateral lower extremity duplex showed no DVT.  08/16/2019 - 08/17/2019 with right lower extremity cellulitis.  He was unable to bear weight.  He was treated with IV fluids, NSAIDs, colchicine, and broad antibiotics (vancomycin and Cefepime).  He was discharged on indomethacin x 5 days and Keflex 500 mg TID x 5 days.  10/05/2020, colonoscopy showed internal hemorrhoids.  Otherwise normal examination. 11/03/2020 PET scan showed recurrent peritoneal metastasis near pancreas. Given that he has no other distant metastasis.  Repeat SBRT may be considered if he does not respond well to systemic chemotherapy.  Discussed with radiation oncology.  11/26/2020 patient was started on 5-FU and bevacizumab. 12/14/2020 CEA 29.5  INTERVAL HISTORY Jared Gutierrez is a 80 y.o. male who has above history reviewed by me today presents for follow up visit for management of recurrent metastatic colon cancer Problems and complaints are listed below: he was accompanied by wife Patient tolerated  5-FU and bevacizumab.  Overall patient tolerates well. Denies any abdominal pain, nausea vomiting or diarrhea.  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  Patient  was given gabapentin which he is not taking currently due to the concern of possible side effects.  Review of Systems  Constitutional:  Negative for appetite change, chills, diaphoresis, fever and unexpected weight change.  HENT:   Negative for hearing loss, nosebleeds, sore throat and tinnitus.   Respiratory:  Positive for shortness of breath. Negative for cough and hemoptysis.   Cardiovascular:  Positive for leg swelling. Negative for chest pain and palpitations.  Gastrointestinal:  Negative for abdominal pain, blood in stool, constipation, diarrhea, nausea and vomiting.  Genitourinary:  Negative for dysuria, frequency and hematuria.   Musculoskeletal:  Negative for back pain, myalgias and neck pain.  Skin:  Negative for itching and rash.  Neurological:  Negative for dizziness and headaches.  Hematological:  Does not bruise/bleed easily.  Psychiatric/Behavioral:  Negative for depression. The patient is not nervous/anxious.      Past Medical History:  Diagnosis Date   Arthritis    OSTEOARTHRITIS   Cancer ([place])    Cavitary lesion of lung    RIGHT LOWER LOBE   Chicken pox    Colon cancer ([facility])    History of kidney stones    Hypertension    Lipoma of colon    Nephrolithiasis    Nephrolithiasis    Obesity    Shingles    Tubular adenoma of colon    multiple fragments    Past Surgical History:  Procedure Laterality Date   COLON SURGERY     COLONOSCOPY N/A 10/02/2014   Procedure: COLONOSCOPY;  Surgeon: Josefine Class, MD;  Location: [facility] ENDOSCOPY;  Service: Endoscopy;  Laterality: N/A;   COLONOSCOPY WITH PROPOFOL N/A 02/16/2017   Procedure: COLONOSCOPY WITH PROPOFOL;  Surgeon: Manya Silvas, MD;  Location: [facility] ENDOSCOPY;  Service: Endoscopy;  Laterality: N/A;   COLONOSCOPY WITH PROPOFOL N/A 10/05/2020   Procedure: COLONOSCOPY WITH PROPOFOL;  Surgeon: Lesly Rubenstein, MD;  Location: [facility] ENDOSCOPY;  Service: Endoscopy;  Laterality: N/A;   EUS N/A 04/17/2019    Procedure: FULL UPPER ENDOSCOPIC ULTRASOUND (EUS) RADIAL;  Surgeon: Holly Bodily, MD;  Location: [facility] ENDOSCOPY;  Service: Gastroenterology;  Laterality: N/A;   KIDNEY STONE SURGERY     PARTIAL COLECTOMY  10/17/2013   PORTACATH PLACEMENT Right 06/13/2019   Procedure: INSERTION PORT-A-CATH;  Surgeon: Benjamine Sprague, DO;  Location: [facility] ORS;  Service: General;  Laterality: Right;    Family History  Problem Relation Age of Onset   Cancer Mother    Breast cancer Mother    COPD Father     Social History:  reports that he has never smoked. He has never used smokeless tobacco. He reports current alcohol use. He reports that he does not use drugs. 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 He is married and his wife's name is [name] [phone]).  The patient is alone today.  Allergies: No Known Allergies  Current Medications: Current Outpatient Medications  Medication Sig Dispense Refill   acetaminophen (TYLENOL) 500 MG tablet Take 500 mg by mouth every 6 (six) hours as needed.     amLODipine (NORVASC) 2.5 MG tablet Take 2.5 mg by mouth daily.     aspirin EC 81 MG tablet Take 81 mg by mouth daily.     Cholecalciferol (VITAMIN D) 125 MCG (5000 UT) CAPS Take 5,000 mg by mouth.     docusate sodium (COLACE) 100 MG capsule Take 100 mg by mouth 2 (two) times daily.     lidocaine-prilocaine (EMLA) cream Apply to affected area once 30 g 3   loratadine (CLARITIN) 10 MG tablet Take 10 mg by mouth daily.     olmesartan (BENICAR) 20 MG tablet Take 1 tablet by mouth daily.     gabapentin (NEURONTIN) 100 MG capsule Take 1 capsule (100 mg total) by mouth 3 (three) times daily. (Patient not taking: No sig reported) 90 capsule 0   HYDROcodone-acetaminophen (NORCO/VICODIN) 5-325 MG tablet Take 1 tablet by mouth every 6 (six) hours as needed for moderate pain (up to 3 doses for moderate pain.). (Patient not taking: Reported on 02/08/2021)     NEOMYCIN-POLYMYXIN-HYDROCORTISONE  (CORTISPORIN) 1 % SOLN OTIC solution SMARTSIG:Left Ear (Patient not taking: No sig reported)     ondansetron (ZOFRAN) 8 MG tablet Take 1 tablet (8 mg total) by mouth 2 (two) times daily as needed for refractory nausea / vomiting. Start on day 3 after chemotherapy. (Patient not taking: Reported on 02/08/2021) 60 tablet 1   potassium chloride SA (KLOR-CON) 20 MEQ tablet Take by mouth. (Patient not taking: No sig reported)     Probiotic Product (PROBIOTIC DAILY PO) Take 1 tablet by mouth daily. (Patient not taking: Reported on 02/08/2021)     prochlorperazine (COMPAZINE) 10 MG tablet Take 1 tablet (10 mg total) by mouth every 6 (six) hours as needed (NAUSEA). (Patient not taking: Reported on 02/08/2021) 30 tablet 1   No current facility-administered medications for this visit.   Facility-Administered Medications Ordered in Other Visits  Medication Dose Route Frequency Provider Last Rate Last Admin   0.9 %  sodium chloride infusion   Intravenous Once Corcoran, Melissa C, MD       0.9 %  sodium chloride infusion   Intravenous Continuous Nolon Stalls C, MD 10 mL/hr at 12/31/19 1000 New Bag at 10/05/20 1045   fluorouracil (ADRUCIL) 5,800 mg in sodium chloride 0.9 % 134 mL chemo infusion  2,400 mg/m2 (Order-Specific) Intravenous 1 day or 1 dose [name], MD   5,800 mg at 02/08/21 1132   heparin lock flush 100 unit/mL  500 Units Intravenous Once Lequita Asal, MD        Performance status (ECOG): 1  Vitals Blood pressure (!) 150/104, pulse 79, temperature 97.8 F (36.6 C), resp. rate 20, weight 273 lb 1.6 oz (123.9 kg), SpO2 100 %.   Physical Exam Vitals and nursing note reviewed.  Constitutional:      General: He is not in acute distress.    Appearance: He is well-developed. He is obese. He is not diaphoretic.     Interventions: Face mask in place.     Comments: Patient walks with a cane  HENT:     Head: Normocephalic and atraumatic.     Mouth/Throat:     Mouth: Mucous membranes are  moist.     Pharynx: Oropharynx is clear.  Eyes:     General: No scleral icterus.    Extraocular Movements: Extraocular movements intact.     Conjunctiva/sclera: Conjunctivae normal.     Pupils: Pupils are equal, round, and reactive to light.  Cardiovascular:     Rate and Rhythm: Normal rate and regular rhythm.     Heart sounds: Normal heart sounds. No murmur heard. Pulmonary:     Effort: Pulmonary effort is normal. No respiratory distress.     Breath sounds: No wheezing or rales.     Comments: Decreased breath sound bilaterally Chest:     Chest wall: No tenderness.  Abdominal:     General: Bowel sounds are normal. There is no distension.     Palpations: Abdomen is soft. There is no mass.     Tenderness: There is no abdominal tenderness. There is no guarding or rebound.  Musculoskeletal:        General: No swelling or tenderness. Normal range of motion.     Cervical back: Normal range of motion and neck supple.     Right lower leg: Edema present.     Left lower leg: Edema present.  Lymphadenopathy:     Head:     Right side of head: No preauricular, posterior auricular or occipital adenopathy.     Left side of head: No preauricular, posterior auricular or occipital adenopathy.     Cervical: No cervical adenopathy.     Upper Body:     Right upper body: No supraclavicular or axillary adenopathy.     Left upper body: No supraclavicular or axillary adenopathy.     Lower Body: No right inguinal adenopathy. No left inguinal adenopathy.  Skin:    General: Skin is warm and dry.  Neurological:     General: No focal deficit present.     Mental Status: He is alert and oriented to person, place, and time. Mental status is at baseline.  Psychiatric:        Mood and Affect: Mood normal.   Infusion on 02/08/2021  Component Date Value Ref Range Status   Sodium 02/08/2021 139  135 - 145 mmol/L Final   Potassium 02/08/2021 3.5  3.5 - 5.1 mmol/L Final   Chloride 02/08/2021 105  98 - 111  mmol/L Final   CO2 02/08/2021 25  22 - 32 mmol/L Final   Glucose, Bld 02/08/2021 132 (A) 70 - 99 mg/dL Final   Glucose reference range applies only to samples taken after fasting for at least 8 hours.   BUN 02/08/2021 10  8 - 23 mg/dL Final   Creatinine, Ser 02/08/2021 0.71  0.61 - 1.24 mg/dL Final   Calcium 02/08/2021 9.2  8.9 - 10.3 mg/dL Final   Total Protein 02/08/2021 6.6  6.5 - 8.1 g/dL Final   Albumin 02/08/2021 3.6  3.5 - 5.0 g/dL Final   AST 02/08/2021 47 (A) 15 - 41 U/L Final   ALT 02/08/2021 36  0 - 44 U/L Final   Alkaline Phosphatase 02/08/2021 45  38 - 126 U/L Final   Total Bilirubin 02/08/2021 1.1  0.3 - 1.2 mg/dL Final   GFR, Estimated 02/08/2021 >60  >60 mL/min Final   Comment: (NOTE) Calculated using the CKD-EPI Creatinine Equation (2021)    Anion gap 02/08/2021 9  5 - 15 Final   Performed at [facility], [place], [place] [address]   WBC 02/08/2021 5.5  4.0 - 10.5 K/uL Final   RBC 02/08/2021 3.98 (A) 4.22 - 5.81 MIL/uL Final   Hemoglobin 02/08/2021 12.8 (A) 13.0 - 17.0 g/dL Final   HCT 02/08/2021 37.9 (A) 39.0 - 52.0 % Final   MCV 02/08/2021 95.2  80.0 - 100.0 fL Final   MCH 02/08/2021 32.2  26.0 - 34.0 pg Final   MCHC 02/08/2021 33.8  30.0 - 36.0 g/dL Final   RDW 02/08/2021 13.4  11.5 - 15.5 % Final   Platelets 02/08/2021 163  150 - 400 K/uL Final   nRBC 02/08/2021 0.0  0.0 - 0.2 % Final   Neutrophils Relative % 02/08/2021 61  % Final   Neutro Abs 02/08/2021 3.4  1.7 - 7.7 K/uL Final   Lymphocytes Relative 02/08/2021 28  % Final   Lymphs Abs 02/08/2021 1.5  0.7 - 4.0 K/uL Final   Monocytes Relative 02/08/2021 8  % Final   Monocytes Absolute 02/08/2021 0.4  0.1 - 1.0 K/uL Final   Eosinophils Relative 02/08/2021 2  % Final   Eosinophils Absolute 02/08/2021 0.1  0.0 - 0.5 K/uL Final   Basophils Relative 02/08/2021 1  % Final   Basophils Absolute 02/08/2021 0.0  0.0 - 0.1 K/uL Final   Immature Granulocytes 02/08/2021 0  % Final   Abs  Immature Granulocytes 02/08/2021 0.01  0.00 - 0.07 K/uL Final   Performed at [facility], [address] [place], [place] [address]   Total Protein, Urine 02/08/2021 14  mg/dL Final   Comment: NO NORMAL RANGE ESTABLISHED FOR THIS TEST Performed at [facility], [address] [place], [place] [address]     Assessment and plan 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 Colon cancer metastasized to multiple sites ([place])   3. Encounter for antineoplastic chemotherapy   4. Anemia due to chemotherapy    #Metastatic colon cancer-recurrent Labs reviewed and discussed with patient CEA is trending down, indicating good response .  Proceed with cycle six 5-FU/bevacizumab.  I will repeat CT abdomen pelvis with contrast   # Chemotherapy induced neuropathy Patient has supply of gabapentin and we discussed about instructions.  He may start if he needs to  #Chemotherapy-induced anemia, hemoglobin is stable monitor 12.8 today.  # Constipation, recommend patient to take Colace 193m -2051mdaily. Symptom improved.  Follow-up 2 weeks lab MD 5-FU/bevacizumab   I discussed the assessment and treatment plan with the patient.  The patient was provided an opportunity to ask questions and all were answered.  The patient agreed with the plan and demonstrated an understanding of the instructions.  The patient was advised to call back if the symptoms worsen or if the condition fails to improve as anticipated.  [name], MD, PhD 02/08/2021

## 2021-02-08 NOTE — Patient Instructions (Signed)
[facility]  Discharge Instructions: Thank you for choosing [facility] to provide your oncology and hematology care.  If you have a lab appointment with the [facility], please go directly to the [facility] and check in at the registration area.  Wear comfortable clothing and clothing appropriate for easy access to any Portacath or PICC line.   We strive to give you quality time with your provider. You may need to reschedule your appointment if you arrive late (15 or more minutes).  Arriving late affects you and other patients whose appointments are after yours.  Also, if you miss three or more appointments without notifying the office, you may be dismissed from the clinic at the provider's discretion.      For prescription refill requests, have your pharmacy contact our office and allow 72 hours for refills to be completed.    Today you received the following chemotherapy and/or immunotherapy agents - fluorouracil, bevacizumab      To help prevent nausea and vomiting after your treatment, we encourage you to take your nausea medication as directed.  BELOW ARE SYMPTOMS THAT SHOULD BE REPORTED IMMEDIATELY: *FEVER GREATER THAN 100.4 F (38 C) OR HIGHER *CHILLS OR SWEATING *NAUSEA AND VOMITING THAT IS NOT CONTROLLED WITH YOUR NAUSEA MEDICATION *UNUSUAL SHORTNESS OF BREATH *UNUSUAL BRUISING OR BLEEDING *URINARY PROBLEMS (pain or burning when urinating, or frequent urination) *BOWEL PROBLEMS (unusual diarrhea, constipation, pain near the anus) TENDERNESS IN MOUTH AND THROAT WITH OR WITHOUT PRESENCE OF ULCERS (sore throat, sores in mouth, or a toothache) UNUSUAL RASH, SWELLING OR PAIN  UNUSUAL VAGINAL DISCHARGE OR ITCHING   Items with * indicate a potential emergency and should be followed up as soon as possible or go to the Emergency Department if any problems should occur.  Please show the CHEMOTHERAPY ALERT CARD or IMMUNOTHERAPY ALERT  CARD at check-in to the Emergency Department and triage nurse.  Should you have questions after your visit or need to cancel or reschedule your appointment, please contact [facility]  [phone] and follow the prompts.  Office hours are 8:00 a.m. to 4:30 p.m. Monday - Friday. Please note that voicemails left after 4:00 p.m. may not be returned until the following business day.  We are closed weekends and major holidays. You have access to a nurse at all times for urgent questions. Please call the main number to the clinic [phone] and follow the prompts.  For any non-urgent questions, you may also contact your provider using MyChart. We now offer e-Visits for anyone 23 and older to request care online for non-urgent symptoms. For details visit [email or web address].   Also download the MyChart app! Go to the app store, search "MyChart", open the app, select [facility], and log in with your MyChart username and password.  Due to Covid, a mask is required upon entering the hospital/clinic. If you do not have a mask, one will be given to you upon arrival. For doctor visits, patients may have 1 support person aged 30 or older with them. For treatment visits, patients cannot have anyone with them due to current Covid guidelines and our immunocompromised population.   Fluorouracil, 5-FU injection What is this medication? FLUOROURACIL, 5-FU (flure oh YOOR a sil) is a chemotherapy drug. It slows the growth of cancer cells. This medicine is used to treat many types of cancer like breast cancer, colon or rectal cancer, pancreatic cancer, and stomach cancer. This medicine may be used for  other purposes; ask your health care provider or pharmacist if you have questions. COMMON BRAND NAME(S): Adrucil What should I tell my care team before I take this medication? They need to know if you have any of these conditions: blood disorders dihydropyrimidine dehydrogenase  (DPD) deficiency infection (especially a virus infection such as chickenpox, cold sores, or herpes) kidney disease liver disease malnourished, poor nutrition recent or ongoing radiation therapy an unusual or allergic reaction to fluorouracil, other chemotherapy, other medicines, foods, dyes, or preservatives pregnant or trying to get pregnant breast-feeding How should I use this medication? This drug is given as an infusion or injection into a vein. It is administered in a hospital or clinic by a specially trained health care professional. Talk to your pediatrician regarding the use of this medicine in children. Special care may be needed. Overdosage: If you think you have taken too much of this medicine contact a poison control center or emergency room at once. NOTE: This medicine is only for you. Do not share this medicine with others. What if I miss a dose? It is important not to miss your dose. Call your doctor or health care professional if you are unable to keep an appointment. What may interact with this medication? Do not take this medicine with any of the following medications: live virus vaccines This medicine may also interact with the following medications: medicines that treat or prevent blood clots like warfarin, enoxaparin, and dalteparin This list may not describe all possible interactions. Give your health care provider a list of all the medicines, herbs, non-prescription drugs, or dietary supplements you use. Also tell them if you smoke, drink alcohol, or use illegal drugs. Some items may interact with your medicine. What should I watch for while using this medication? Visit your doctor for checks on your progress. This drug may make you feel generally unwell. This is not uncommon, as chemotherapy can affect healthy cells as well as cancer cells. Report any side effects. Continue your course of treatment even though you feel ill unless your doctor tells you to stop. In some  cases, you may be given additional medicines to help with side effects. Follow all directions for their use. Call your doctor or health care professional for advice if you get a fever, chills or sore throat, or other symptoms of a cold or flu. Do not treat yourself. This drug decreases your body's ability to fight infections. Try to avoid being around people who are sick. This medicine may increase your risk to bruise or bleed. Call your doctor or health care professional if you notice any unusual bleeding. Be careful brushing and flossing your teeth or using a toothpick because you may get an infection or bleed more easily. If you have any dental work done, tell your dentist you are receiving this medicine. Avoid taking products that contain aspirin, acetaminophen, ibuprofen, naproxen, or ketoprofen unless instructed by your doctor. These medicines may hide a fever. Do not become pregnant while taking this medicine. Women should inform their doctor if they wish to become pregnant or think they might be pregnant. There is a potential for serious side effects to an unborn child. Talk to your health care professional or pharmacist for more information. Do not breast-feed an infant while taking this medicine. Men should inform their doctor if they wish to father a child. This medicine may lower sperm counts. Do not treat diarrhea with over the counter products. Contact your doctor if you have diarrhea that lasts more  than 2 days or if it is severe and watery. This medicine can make you more sensitive to the sun. Keep out of the sun. If you cannot avoid being in the sun, wear protective clothing and use sunscreen. Do not use sun lamps or tanning beds/booths. What side effects may I notice from receiving this medication? Side effects that you should report to your doctor or health care professional as soon as possible: allergic reactions like skin rash, itching or hives, swelling of the face, lips, or  tongue low blood counts - this medicine may decrease the number of white blood cells, red blood cells and platelets. You may be at increased risk for infections and bleeding. signs of infection - fever or chills, cough, sore throat, pain or difficulty passing urine signs of decreased platelets or bleeding - bruising, pinpoint red spots on the skin, black, tarry stools, blood in the urine signs of decreased red blood cells - unusually weak or tired, fainting spells, lightheadedness breathing problems changes in vision chest pain mouth sores nausea and vomiting pain, swelling, redness at site where injected pain, tingling, numbness in the hands or feet redness, swelling, or sores on hands or feet stomach pain unusual bleeding Side effects that usually do not require medical attention (report to your doctor or health care professional if they continue or are bothersome): changes in finger or toe nails diarrhea dry or itchy skin hair loss headache loss of appetite sensitivity of eyes to the light stomach upset unusually teary eyes This list may not describe all possible side effects. Call your doctor for medical advice about side effects. You may report side effects to FDA at 1-800-FDA-1088. Where should I keep my medication? This drug is given in a hospital or clinic and will not be stored at home. NOTE: This sheet is a summary. It may not cover all possible information. If you have questions about this medicine, talk to your doctor, pharmacist, or health care provider.  2022 [employer]vier/Gold Standard (2019-04-01 15:00:03)  Bevacizumab injection What is this medication? BEVACIZUMAB (be va SIZ yoo mab) is a monoclonal antibody. It is used to treat many types of cancer. This medicine may be used for other purposes; ask your health care provider or pharmacist if you have questions. COMMON BRAND NAME(S): Avastin, MVASI, Noah Charon What should I tell my care team before I take this  medication? They need to know if you have any of these conditions: diabetes heart disease high blood pressure history of coughing up blood prior anthracycline chemotherapy (e.g., doxorubicin, daunorubicin, epirubicin) recent or ongoing radiation therapy recent or planning to have surgery stroke an unusual or allergic reaction to bevacizumab, hamster proteins, mouse proteins, other medicines, foods, dyes, or preservatives pregnant or trying to get pregnant breast-feeding How should I use this medication? This medicine is for infusion into a vein. It is given by a health care professional in a hospital or clinic setting. Talk to your pediatrician regarding the use of this medicine in children. Special care may be needed. Overdosage: If you think you have taken too much of this medicine contact a poison control center or emergency room at once. NOTE: This medicine is only for you. Do not share this medicine with others. What if I miss a dose? It is important not to miss your dose. Call your doctor or health care professional if you are unable to keep an appointment. What may interact with this medication? Interactions are not expected. This list may not describe all possible interactions.  Give your health care provider a list of all the medicines, herbs, non-prescription drugs, or dietary supplements you use. Also tell them if you smoke, drink alcohol, or use illegal drugs. Some items may interact with your medicine. What should I watch for while using this medication? Your condition will be monitored carefully while you are receiving this medicine. You will need important blood work and urine testing done while you are taking this medicine. This medicine may increase your risk to bruise or bleed. Call your doctor or health care professional if you notice any unusual bleeding. Before having surgery, talk to your health care provider to make sure it is ok. This drug can increase the risk of  poor healing of your surgical site or wound. You will need to stop this drug for 28 days before surgery. After surgery, wait at least 28 days before restarting this drug. Make sure the surgical site or wound is healed enough before restarting this drug. Talk to your health care provider if questions. Do not become pregnant while taking this medicine or for 6 months after stopping it. Women should inform their doctor if they wish to become pregnant or think they might be pregnant. There is a potential for serious side effects to an unborn child. Talk to your health care professional or pharmacist for more information. Do not breast-feed an infant while taking this medicine and for 6 months after the last dose. This medicine has caused ovarian failure in some women. This medicine may interfere with the ability to have a child. You should talk to your doctor or health care professional if you are concerned about your fertility. What side effects may I notice from receiving this medication? Side effects that you should report to your doctor or health care professional as soon as possible: allergic reactions like skin rash, itching or hives, swelling of the face, lips, or tongue chest pain or chest tightness chills coughing up blood high fever seizures severe constipation signs and symptoms of bleeding such as bloody or black, tarry stools; red or dark-brown urine; spitting up blood or brown material that looks like coffee grounds; red spots on the skin; unusual bruising or bleeding from the eye, gums, or nose signs and symptoms of a blood clot such as breathing problems; chest pain; severe, sudden headache; pain, swelling, warmth in the leg signs and symptoms of a stroke like changes in vision; confusion; trouble speaking or understanding; severe headaches; sudden numbness or weakness of the face, arm or leg; trouble walking; dizziness; loss of balance or coordination stomach pain sweating swelling of  legs or ankles vomiting weight gain Side effects that usually do not require medical attention (report to your doctor or health care professional if they continue or are bothersome): back pain changes in taste decreased appetite dry skin nausea tiredness This list may not describe all possible side effects. Call your doctor for medical advice about side effects. You may report side effects to FDA at 1-800-FDA-1088. Where should I keep my medication? This drug is given in a hospital or clinic and will not be stored at home. NOTE: This sheet is a summary. It may not cover all possible information. If you have questions about this medicine, talk to your doctor, pharmacist, or health care provider.  2022 Elsevier/Gold Standard (2019-02-26 10:50:46)

## 2021-02-09 LAB — CEA: CEA: 14.3 ng/mL — ABNORMAL HIGH (ref 0.0–4.7)

## 2021-02-10 ENCOUNTER — Other Ambulatory Visit: Payer: Self-pay

## 2021-02-10 ENCOUNTER — Inpatient Hospital Stay: Payer: Medicare Other

## 2021-02-10 DIAGNOSIS — Z85038 Personal history of other malignant neoplasm of large intestine: Secondary | ICD-10-CM

## 2021-02-10 DIAGNOSIS — Z5112 Encounter for antineoplastic immunotherapy: Secondary | ICD-10-CM | POA: Diagnosis not present

## 2021-02-10 DIAGNOSIS — C786 Secondary malignant neoplasm of retroperitoneum and peritoneum: Secondary | ICD-10-CM

## 2021-02-10 MED ORDER — HEPARIN SOD (PORK) LOCK FLUSH 100 UNIT/ML IV SOLN
500.0000 [IU] | Freq: Once | INTRAVENOUS | Status: AC | PRN
  Filled 2021-02-10: qty 5

## 2021-02-10 MED ORDER — SODIUM CHLORIDE 0.9% FLUSH
10.0000 mL | INTRAVENOUS | Status: DC | PRN
  Administered 2021-02-10: 10 mL
  Filled 2021-02-10: qty 10

## 2021-02-10 MED ORDER — HEPARIN SOD (PORK) LOCK FLUSH 100 UNIT/ML IV SOLN
INTRAVENOUS | Status: AC
  Administered 2021-02-10: 500 [IU]
  Filled 2021-02-10: qty 5

## 2021-02-17 ENCOUNTER — Ambulatory Visit
Admission: RE | Admit: 2021-02-17 | Discharge: 2021-02-17 | Disposition: A | Discharge status: Home / Self Care | Payer: Medicare Other | Source: Ambulatory Visit | Attending: Oncology | Admitting: Oncology

## 2021-02-17 DIAGNOSIS — C189 Malignant neoplasm of colon, unspecified: Secondary | ICD-10-CM | POA: Diagnosis present

## 2021-02-17 DIAGNOSIS — C786 Secondary malignant neoplasm of retroperitoneum and peritoneum: Secondary | ICD-10-CM

## 2021-02-17 MED ORDER — IOHEXOL 350 MG/ML SOLN
100.0000 mL | Freq: Once | INTRAVENOUS | Status: AC | PRN
  Administered 2021-02-17: 100 mL via INTRAVENOUS

## 2021-02-18 ENCOUNTER — Encounter: Payer: Self-pay | Admitting: Oncology

## 2021-02-22 ENCOUNTER — Inpatient Hospital Stay: Payer: Medicare Other | Attending: Oncology

## 2021-02-22 ENCOUNTER — Inpatient Hospital Stay (HOSPITAL_BASED_OUTPATIENT_CLINIC_OR_DEPARTMENT_OTHER): Payer: Medicare Other | Admitting: Oncology

## 2021-02-22 ENCOUNTER — Encounter: Payer: Self-pay | Admitting: Oncology

## 2021-02-22 ENCOUNTER — Inpatient Hospital Stay: Payer: Medicare Other

## 2021-02-22 VITALS — BP 140/76 | HR 75 | Temp 98.8°F | Resp 18 | Wt 268.7 lb

## 2021-02-22 DIAGNOSIS — G620 Drug-induced polyneuropathy: Secondary | ICD-10-CM

## 2021-02-22 DIAGNOSIS — T451X5D Adverse effect of antineoplastic and immunosuppressive drugs, subsequent encounter: Secondary | ICD-10-CM | POA: Insufficient documentation

## 2021-02-22 DIAGNOSIS — Z5111 Encounter for antineoplastic chemotherapy: Secondary | ICD-10-CM

## 2021-02-22 DIAGNOSIS — Z85038 Personal history of other malignant neoplasm of large intestine: Secondary | ICD-10-CM

## 2021-02-22 DIAGNOSIS — C184 Malignant neoplasm of transverse colon: Secondary | ICD-10-CM | POA: Insufficient documentation

## 2021-02-22 DIAGNOSIS — Z5112 Encounter for antineoplastic immunotherapy: Secondary | ICD-10-CM | POA: Diagnosis present

## 2021-02-22 DIAGNOSIS — K5900 Constipation, unspecified: Secondary | ICD-10-CM | POA: Diagnosis not present

## 2021-02-22 DIAGNOSIS — D6481 Anemia due to antineoplastic chemotherapy: Secondary | ICD-10-CM | POA: Diagnosis not present

## 2021-02-22 DIAGNOSIS — Z79899 Other long term (current) drug therapy: Secondary | ICD-10-CM | POA: Insufficient documentation

## 2021-02-22 DIAGNOSIS — C786 Secondary malignant neoplasm of retroperitoneum and peritoneum: Secondary | ICD-10-CM | POA: Diagnosis not present

## 2021-02-22 DIAGNOSIS — Z809 Family history of malignant neoplasm, unspecified: Secondary | ICD-10-CM | POA: Diagnosis not present

## 2021-02-22 DIAGNOSIS — Z836 Family history of other diseases of the respiratory system: Secondary | ICD-10-CM | POA: Insufficient documentation

## 2021-02-22 DIAGNOSIS — Z86010 Personal history of colonic polyps: Secondary | ICD-10-CM | POA: Diagnosis not present

## 2021-02-22 DIAGNOSIS — Z803 Family history of malignant neoplasm of breast: Secondary | ICD-10-CM | POA: Diagnosis not present

## 2021-02-22 DIAGNOSIS — C189 Malignant neoplasm of colon, unspecified: Secondary | ICD-10-CM

## 2021-02-22 DIAGNOSIS — T451X5A Adverse effect of antineoplastic and immunosuppressive drugs, initial encounter: Secondary | ICD-10-CM

## 2021-02-22 LAB — PROTEIN, URINE, RANDOM: Total Protein, Urine: 20 mg/dL

## 2021-02-22 LAB — CBC WITH DIFFERENTIAL/PLATELET
Abs Immature Granulocytes: 0.01 10*3/uL (ref 0.00–0.07)
Basophils Absolute: 0 10*3/uL (ref 0.0–0.1)
Basophils Relative: 1 %
Eosinophils Absolute: 0.1 10*3/uL (ref 0.0–0.5)
Eosinophils Relative: 2 %
HCT: 39.5 % (ref 39.0–52.0)
Hemoglobin: 13.1 g/dL (ref 13.0–17.0)
Immature Granulocytes: 0 %
Lymphocytes Relative: 29 %
Lymphs Abs: 1.3 10*3/uL (ref 0.7–4.0)
MCH: 31.6 pg (ref 26.0–34.0)
MCHC: 33.2 g/dL (ref 30.0–36.0)
MCV: 95.2 fL (ref 80.0–100.0)
Monocytes Absolute: 0.4 10*3/uL (ref 0.1–1.0)
Monocytes Relative: 9 %
Neutro Abs: 2.7 10*3/uL (ref 1.7–7.7)
Neutrophils Relative %: 59 %
Platelets: 170 10*3/uL (ref 150–400)
RBC: 4.15 MIL/uL — ABNORMAL LOW (ref 4.22–5.81)
RDW: 13.5 % (ref 11.5–15.5)
WBC: 4.6 10*3/uL (ref 4.0–10.5)
nRBC: 0 % (ref 0.0–0.2)

## 2021-02-22 LAB — COMPREHENSIVE METABOLIC PANEL
ALT: 37 U/L (ref 0–44)
AST: 53 U/L — ABNORMAL HIGH (ref 15–41)
Albumin: 3.6 g/dL (ref 3.5–5.0)
Alkaline Phosphatase: 46 U/L (ref 38–126)
Anion gap: 9 (ref 5–15)
BUN: 9 mg/dL (ref 8–23)
CO2: 26 mmol/L (ref 22–32)
Calcium: 9.6 mg/dL (ref 8.9–10.3)
Chloride: 101 mmol/L (ref 98–111)
Creatinine, Ser: 0.75 mg/dL (ref 0.61–1.24)
GFR, Estimated: >60 mL/min (ref >60)
Glucose, Bld: 139 mg/dL — ABNORMAL HIGH (ref 70–99)
Potassium: 3.7 mmol/L (ref 3.5–5.1)
Sodium: 136 mmol/L (ref 135–145)
Total Bilirubin: 1 mg/dL (ref 0.3–1.2)
Total Protein: 7 g/dL (ref 6.5–8.1)

## 2021-02-22 MED ORDER — SODIUM CHLORIDE 0.9 % IV SOLN
391.0000 mg/m2 | Freq: Once | INTRAVENOUS | Status: AC
  Administered 2021-02-22: 950 mg via INTRAVENOUS
  Filled 2021-02-22: qty 47.5

## 2021-02-22 MED ORDER — SODIUM CHLORIDE 0.9 % IV SOLN
10.0000 mg | Freq: Once | INTRAVENOUS | Status: AC
  Administered 2021-02-22: 10 mg via INTRAVENOUS
  Filled 2021-02-22: qty 10

## 2021-02-22 MED ORDER — SODIUM CHLORIDE 0.9 % IV SOLN
5.0000 mg/kg | Freq: Once | INTRAVENOUS | Status: AC
  Administered 2021-02-22: 600 mg via INTRAVENOUS
  Filled 2021-02-22: qty 16

## 2021-02-22 MED ORDER — SODIUM CHLORIDE 0.9 % IV SOLN
Freq: Once | INTRAVENOUS | Status: AC
  Filled 2021-02-22: qty 250

## 2021-02-22 MED ORDER — FLUOROURACIL CHEMO INJECTION 2.5 GM/50ML
400.0000 mg/m2 | Freq: Once | INTRAVENOUS | Status: AC
  Administered 2021-02-22: 950 mg via INTRAVENOUS
  Filled 2021-02-22: qty 19

## 2021-02-22 MED ORDER — HEPARIN SOD (PORK) LOCK FLUSH 100 UNIT/ML IV SOLN
500.0000 [IU] | Freq: Once | INTRAVENOUS | Status: DC
  Filled 2021-02-22: qty 5

## 2021-02-22 MED ORDER — SODIUM CHLORIDE 0.9 % IV SOLN
2400.0000 mg/m2 | INTRAVENOUS | Status: DC
  Administered 2021-02-22: 5800 mg via INTRAVENOUS
  Filled 2021-02-22: qty 116

## 2021-02-22 MED ORDER — SODIUM CHLORIDE 0.9% FLUSH
10.0000 mL | Freq: Once | INTRAVENOUS | Status: AC
  Administered 2021-02-22: 10 mL via INTRAVENOUS
  Filled 2021-02-22: qty 10

## 2021-02-22 NOTE — Progress Notes (Signed)
Hematology Oncology Progress Note   Clinic Day: 02/22/2021   Referring physician: Tracie Harrier, MD  Chief Complaint: Jared Gutierrez is a 80 y.o. male presents for follow-up of metastatic colon cancer   PERTINENT ONCOLOGY HISTORY Jared Gutierrez is a 80 y.o.[place] who has above oncology history reviewed by me today presented for follow up visit for management of  Metastatic colon cancer. [name], patient switched care to me on 11/11/20 Extensive medical record review was performed by me  # Metastatic colon cancer.  he was diagnosed with stage I colon cancer s/p transverse colectomy on 10/17/2013.  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  Tumor extended into the submucosa. Margins were negative. + lymphovascular invasion. 14 lymph nodes were negative. Pathologic stage was T1 N0.   10/02/2014 Colonoscopy showed several 3 mm polyps and an 8 mm polyp in the cecum and transverse colon.  Pathology revealed tubular adenomas negative for high-grade dysplasia and malignancy.  02/16/2017 Colonoscopy  revealed 2 diminutive polyps in the descending colon.  Pathology revealed tubular adenomas without dysplasia or malignancy.  Her CEA has been monitored and was noted to increase starting July 2020. 04/02/2019  PET scan  limited evealed a 2.4 x 2.3 cm (SUV 11) hypermetabolic soft tissue density caudal and anterior to the pancreatic neck favored to represent isolated peritoneal or nodal metastasis in the setting of prior transverse colonic resection (expected primary drainage). Although this was immediately adjacent to the pancreas, a fat plane was maintained, arguing strongly against a pancreatic primary. Otherwise, there was no evidence of hypermetabolic metastasis.   04/17/2019 EUS on  revealed a normal esophagus, stomach, duodenum, and pancreas.  There was a  2.4 x 2.4 cm irregular mass in the retroperitoneum adjacent to, but not involving the pancreatic neck.  FNA and core needle biopsy were performed.  Pathology revealed adenocarcinoma compatible with a metastatic lesion of colorectal origin.  Tumor cells were positive for CK20 and CDX2 and negative for CK7.   NGS: [employer] on 05/15/2019 revealed + KRASG13D and TP53.  Negative results included BRAF V600E, Her2, NRAS, NTRK, PD-L1 (<1%), and TMB 8.7/Mb (intermediate).  MMR testing from his colon resection on 10/16/2013 was intact with a low probability of MSI-H.  06/25/2019, CEA 17.8. 07/09/2019 - 08/27/2019; 09/17/2019 - 10/15/2019; 11/12/2019 - 12/17/2019 He received 11 cycles of FOLFOX chemotherapy and 1 cycle of 5-FU and leucovorin (12/31/2019).  He received Neulasta after cycle #4 and #5 secondary to progressive leukopenia.  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  He received a truncated course of FOLFOX with cycle #11 secondary to oxaliplatin reaction.  08/27/2019, CEA 6.1 10/29/2019, CEA 7.7 12/31/2019, CEA 10.8.  01/14/2020 PET revealed an interval decrease in size and FDG uptake (2.4 x 2.3 cm with SUV 11 to 2.4 x 1.7 cm with SUV 4.09) associated with the previously referenced soft tissue density caudal and anterior to the pancreatic neck suggesting treatment response. There were no new sites of FDG avid tumor.  Radiation treatment 02/10/2020 - 02/23/2020.   He received a hybrid 3-dimensional course of 3000 cGy in 10 fractions to the residual FDG avid mass   05/27/2020, CEA 11.6 05/27/2020 Abdomen and pelvis CT :stable to minimally increased (2.9 x 1.8 cm compared to 2.4 x 1.7 cm) since 01/14/2020.  There were no new interval findings.    08/31/2020, CEA 30.9. 08/31/2020 Abdomen and pelvis CT revealed increased size of  the index soft tissue lesion inferior and anterior to the pancreatic neck (2.9 x 1.8 cm to 3.5 x 2.9 cm). There were similar prominent retroperitoneal lymph nodes without  adenopathy by size criteria. There was no new or enlarging abdominal or pelvic lymph nodes. There were no new interval findings. There was similar circumferential wall thickening of a nondistended urinary bladder, which likely accentuated wall thickening There was hepatic steatosis and aortic atherosclerosis.  11/03/2020, PET showed recurrent peritoneal metastasis in the upper abdomen adjacent to the pancreas.  The lesion is 3.8 x 2.9 cm with SUV of 11.3. No evidence of metastatic peritoneal disease elsewhere in the abdomen pelvis.  No evidence of distant metastasis.  Other medical problems Chronic lower extremity edema. 12/24/2018, right lower extremity duplex negative for DVT.  Small right Baker's cyst. 08/16/2019, bilateral lower extremity duplex showed no DVT.  08/16/2019 - 08/17/2019 with right lower extremity cellulitis.  He was unable to bear weight.  He was treated with IV fluids, NSAIDs, colchicine, and broad antibiotics (vancomycin and Cefepime).  He was discharged on indomethacin x 5 days and Keflex 500 mg TID x 5 days.  10/05/2020, colonoscopy showed internal hemorrhoids.  Otherwise normal examination. 11/03/2020 PET scan showed recurrent peritoneal metastasis near pancreas. Given that he has no other distant metastasis.  Repeat SBRT may be considered if he does not respond well to systemic chemotherapy.  Discussed with radiation oncology.  11/26/2020 patient was started on 5-FU and bevacizumab. 12/14/2020 CEA 29.5  INTERVAL HISTORY Jared Gutierrez is a 80 y.o. male who has above history reviewed by me today presents for follow up visit for management of recurrent metastatic colon cancer Problems and complaints are listed below: he was accompanied by wife Patient tolerated  5-FU and bevacizumab.  Denies any abdominal pain, nausea vomiting or diarrhea.  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  Patient was given gabapentin which he is not taking currently due to  the concern of possible side effects. He feels that the symptom is getting slightly worse.   Review of Systems  Constitutional:  Negative for appetite change, chills, diaphoresis, fever and unexpected weight change.  HENT:   Negative for hearing loss, nosebleeds, sore throat and tinnitus.   Respiratory:  Positive for shortness of breath. Negative for cough and hemoptysis.   Cardiovascular:  Positive for leg swelling. Negative for chest pain and palpitations.  Gastrointestinal:  Negative for abdominal pain, blood in stool, constipation, diarrhea, nausea and vomiting.  Genitourinary:  Negative for dysuria, frequency and hematuria.   Musculoskeletal:  Negative for back pain, myalgias and neck pain.  Skin:  Negative for itching and rash.  Neurological:  Negative for dizziness and headaches.  Hematological:  Does not bruise/bleed easily.  Psychiatric/Behavioral:  Negative for depression. The patient is not nervous/anxious.      Past Medical History:  Diagnosis Date   Arthritis    OSTEOARTHRITIS   Cancer ([place])    Cavitary lesion of lung    RIGHT LOWER LOBE   Chicken pox    Colon cancer (HCC)    History of kidney stones    Hypertension    Lipoma of colon    Nephrolithiasis    Nephrolithiasis    Obesity    Shingles    Tubular adenoma of colon    multiple fragments    Past Surgical History:  Procedure Laterality Date   COLON SURGERY     COLONOSCOPY N/A 10/02/2014   Procedure: COLONOSCOPY;  Surgeon: Josefine Class, MD;  Location: [facility] ENDOSCOPY;  Service: Endoscopy;  Laterality: N/A;   COLONOSCOPY WITH PROPOFOL N/A 02/16/2017   Procedure: COLONOSCOPY WITH PROPOFOL;  Surgeon: Manya Silvas, MD;  Location: [facility] ENDOSCOPY;  Service: Endoscopy;  Laterality: N/A;   COLONOSCOPY WITH PROPOFOL N/A 10/05/2020   Procedure: COLONOSCOPY WITH PROPOFOL;  Surgeon: Lesly Rubenstein, MD;  Location: [facility] ENDOSCOPY;  Service: Endoscopy;  Laterality: N/A;   EUS N/A 04/17/2019   Procedure:  FULL UPPER ENDOSCOPIC ULTRASOUND (EUS) RADIAL;  Surgeon: Holly Bodily, MD;  Location: [facility] ENDOSCOPY;  Service: Gastroenterology;  Laterality: N/A;   KIDNEY STONE SURGERY     PARTIAL COLECTOMY  10/17/2013   PORTACATH PLACEMENT Right 06/13/2019   Procedure: INSERTION PORT-A-CATH;  Surgeon: Benjamine Sprague, DO;  Location: [facility] ORS;  Service: General;  Laterality: Right;    Family History  Problem Relation Age of Onset   Cancer Mother    Breast cancer Mother    COPD Father     Social History:  reports that he has never smoked. He has never used smokeless tobacco. He reports current alcohol use. He reports that he does not use drugs. 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 He is married and his wife's name is [name] [phone]).  The patient is alone today.  Allergies: No Known Allergies  Current Medications: Current Outpatient Medications  Medication Sig Dispense Refill   acetaminophen (TYLENOL) 500 MG tablet Take 500 mg by mouth every 6 (six) hours as needed.     amLODipine (NORVASC) 2.5 MG tablet Take 2.5 mg by mouth daily.     aspirin EC 81 MG tablet Take 81 mg by mouth daily.     Cholecalciferol (VITAMIN D) 125 MCG (5000 UT) CAPS Take 5,000 mg by mouth.     docusate sodium (COLACE) 100 MG capsule Take 100 mg by mouth 2 (two) times daily.     lidocaine-prilocaine (EMLA) cream Apply to affected area once 30 g 3   loratadine (CLARITIN) 10 MG tablet Take 10 mg by mouth daily.     olmesartan (BENICAR) 20 MG tablet Take 1 tablet by mouth daily.     ondansetron (ZOFRAN) 8 MG tablet Take 1 tablet (8 mg total) by mouth 2 (two) times daily as needed for refractory nausea / vomiting. Start on day 3 after chemotherapy. 60 tablet 1   Probiotic Product (PROBIOTIC DAILY PO) Take 1 tablet by mouth daily.     prochlorperazine (COMPAZINE) 10 MG tablet Take 1 tablet (10 mg total) by mouth every 6 (six) hours as needed (NAUSEA). 30 tablet 1   gabapentin (NEURONTIN)  100 MG capsule Take 1 capsule (100 mg total) by mouth 3 (three) times daily. (Patient not taking: No sig reported) 90 capsule 0   HYDROcodone-acetaminophen (NORCO/VICODIN) 5-325 MG tablet Take 1 tablet by mouth every 6 (six) hours as needed for moderate pain (up to 3 doses for moderate pain.). (Patient not taking: No sig reported)     NEOMYCIN-POLYMYXIN-HYDROCORTISONE (CORTISPORIN) 1 % SOLN OTIC solution SMARTSIG:Left Ear (Patient not taking: No sig reported)     potassium chloride SA (KLOR-CON) 20 MEQ tablet Take by mouth. (Patient not taking: No sig reported)     No current facility-administered medications for this visit.   Facility-Administered Medications Ordered in Other Visits  Medication Dose Route Frequency Provider Last Rate Last Admin   0.9 %  sodium chloride infusion   Intravenous Once Lequita Asal, MD  0.9 %  sodium chloride infusion   Intravenous Continuous Nolon Stalls C, MD 10 mL/hr at 12/31/19 1000 New Bag at 10/05/20 1045   heparin lock flush 100 unit/mL  500 Units Intravenous Once Nolon Stalls C, MD       heparin lock flush 100 unit/mL  500 Units Intravenous Once [name], MD        Performance status (ECOG): 1  Vitals Blood pressure 140/76, pulse 75, temperature 98.8 F (37.1 C), resp. rate 18, weight 268 lb 11.2 oz (121.9 kg).   Physical Exam Vitals and nursing note reviewed.  Constitutional:      General: He is not in acute distress.    Appearance: He is well-developed. He is obese. He is not diaphoretic.     Interventions: Face mask in place.     Comments: Patient walks with a cane  HENT:     Head: Normocephalic and atraumatic.     Mouth/Throat:     Mouth: Mucous membranes are moist.     Pharynx: Oropharynx is clear.  Eyes:     General: No scleral icterus.    Extraocular Movements: Extraocular movements intact.     Conjunctiva/sclera: Conjunctivae normal.     Pupils: Pupils are equal, round, and reactive to light.  Cardiovascular:      Rate and Rhythm: Normal rate and regular rhythm.     Heart sounds: Normal heart sounds. No murmur heard. Pulmonary:     Effort: Pulmonary effort is normal. No respiratory distress.     Breath sounds: No wheezing or rales.     Comments: Decreased breath sound bilaterally Chest:     Chest wall: No tenderness.  Abdominal:     General: Bowel sounds are normal. There is no distension.     Palpations: Abdomen is soft. There is no mass.     Tenderness: There is no abdominal tenderness. There is no guarding or rebound.  Musculoskeletal:        General: No swelling or tenderness. Normal range of motion.     Cervical back: Normal range of motion and neck supple.     Right lower leg: Edema present.     Left lower leg: Edema present.  Lymphadenopathy:     Head:     Right side of head: No preauricular, posterior auricular or occipital adenopathy.     Left side of head: No preauricular, posterior auricular or occipital adenopathy.     Cervical: No cervical adenopathy.     Upper Body:     Right upper body: No supraclavicular or axillary adenopathy.     Left upper body: No supraclavicular or axillary adenopathy.     Lower Body: No right inguinal adenopathy. No left inguinal adenopathy.  Skin:    General: Skin is warm and dry.  Neurological:     General: No focal deficit present.     Mental Status: He is alert and oriented to person, place, and time. Mental status is at baseline.  Psychiatric:        Mood and Affect: Mood normal.   Infusion on 02/22/2021  Component Date Value Ref Range Status   WBC 02/22/2021 4.6  4.0 - 10.5 K/uL Final   RBC 02/22/2021 4.15 (A) 4.22 - 5.81 MIL/uL Final   Hemoglobin 02/22/2021 13.1  13.0 - 17.0 g/dL Final   HCT 02/22/2021 39.5  39.0 - 52.0 % Final   MCV 02/22/2021 95.2  80.0 - 100.0 fL Final   MCH 02/22/2021 31.6  26.0 - 34.0  pg Final   MCHC 02/22/2021 33.2  30.0 - 36.0 g/dL Final   RDW 02/22/2021 13.5  11.5 - 15.5 % Final   Platelets 02/22/2021 170  150 -  400 K/uL Final   nRBC 02/22/2021 0.0  0.0 - 0.2 % Final   Neutrophils Relative % 02/22/2021 59  % Final   Neutro Abs 02/22/2021 2.7  1.7 - 7.7 K/uL Final   Lymphocytes Relative 02/22/2021 29  % Final   Lymphs Abs 02/22/2021 1.3  0.7 - 4.0 K/uL Final   Monocytes Relative 02/22/2021 9  % Final   Monocytes Absolute 02/22/2021 0.4  0.1 - 1.0 K/uL Final   Eosinophils Relative 02/22/2021 2  % Final   Eosinophils Absolute 02/22/2021 0.1  0.0 - 0.5 K/uL Final   Basophils Relative 02/22/2021 1  % Final   Basophils Absolute 02/22/2021 0.0  0.0 - 0.1 K/uL Final   Immature Granulocytes 02/22/2021 0  % Final   Abs Immature Granulocytes 02/22/2021 0.01  0.00 - 0.07 K/uL Final   Performed at [facility], [address], [place], [place] [address]    Assessment and plan 1. Chemotherapy-induced neuropathy ([place])   2. Encounter for antineoplastic chemotherapy   3. 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 02/17/2021 CT abdomen pelvis showed partial response. Mild decrease of peritoneal lesion.  Proceed with 5-FU and bevacizumab today.  Given that he is tolerating current regimen with good life quality, partial response. I will hold off adding additional agents.  # Chemotherapy induced neuropathy Patient has supply of gabapentin and does not want to use due to concern of side effects.  Discussed about acupuncture option. Patient prefers to defer that for now and he will let me know if he wants to try acupuncture.   Follow-up 2 weeks lab MD 5-FU/bevacizumab   I discussed the assessment and treatment plan with the patient.  The patient was provided an opportunity to ask questions and all were answered.  The patient agreed with the plan and demonstrated an understanding of the instructions.  The patient was advised to call back if the symptoms worsen or if the condition fails to  improve as anticipated.  [name], MD, PhD 02/22/2021

## 2021-02-22 NOTE — Progress Notes (Signed)
Patient here for follow up. Pt reports tingling in hands has gotten worse.

## 2021-02-22 NOTE — Patient Instructions (Signed)
[facility] ONCOLOG[facility]  Discharge Instructions: Thank you for choosing [facility] to provide your oncology and hematology care.  If you have a lab appointment with the [facility], please go directly to the [facility] and check in at the registration area.  Wear comfortable clothing and clothing appropriate for easy access to any Portacath or PICC line.   We strive to give you quality time with your provider. You may need to reschedule your appointment if you arrive late (15 or more minutes).  Arriving late affects you and other patients whose appointments are after yours.  Also, if you miss three or more appointments without notifying the office, you may be dismissed from the clinic at the provider's discretion.      For prescription refill requests, have your pharmacy contact our office and allow 72 hours for refills to be completed.    Today you received the following chemotherapy and/or immunotherapy agents : Avastin / Leucovorin / 5FU   To help prevent nausea and vomiting after your treatment, we encourage you to take your nausea medication as directed.  BELOW ARE SYMPTOMS THAT SHOULD BE REPORTED IMMEDIATELY: *FEVER GREATER THAN 100.4 F (38 C) OR HIGHER *CHILLS OR SWEATING *NAUSEA AND VOMITING THAT IS NOT CONTROLLED WITH YOUR NAUSEA MEDICATION *UNUSUAL SHORTNESS OF BREATH *UNUSUAL BRUISING OR BLEEDING *URINARY PROBLEMS (pain or burning when urinating, or frequent urination) *BOWEL PROBLEMS (unusual diarrhea, constipation, pain near the anus) TENDERNESS IN MOUTH AND THROAT WITH OR WITHOUT PRESENCE OF ULCERS (sore throat, sores in mouth, or a toothache) UNUSUAL RASH, SWELLING OR PAIN  UNUSUAL VAGINAL DISCHARGE OR ITCHING   Items with * indicate a potential emergency and should be followed up as soon as possible or go to the Emergency Department if any problems should occur.  Please show the CHEMOTHERAPY ALERT CARD or IMMUNOTHERAPY ALERT  CARD at check-in to the Emergency Department and triage nurse.  Should you have questions after your visit or need to cancel or reschedule your appointment, please contact [facility]  [phone] and follow the prompts.  Office hours are 8:00 a.m. to 4:30 p.m. Monday - Friday. Please note that voicemails left after 4:00 p.m. may not be returned until the following business day.  We are closed weekends and major holidays. You have access to a nurse at all times for urgent questions. Please call the main number to the clinic [phone] and follow the prompts.  For any non-urgent questions, you may also contact your provider using MyChart. We now offer e-Visits for anyone 20 and older to request care online for non-urgent symptoms. For details visit [email or web address].   Also download the MyChart app! Go to the app store, search "MyChart", open the app, select , and log in with your MyChart username and password.  Due to Covid, a mask is required upon entering the hospital/clinic. If you do not have a mask, one will be given to you upon arrival. For doctor visits, patients may have 1 support person aged 89 or older with them. For treatment visits, patients cannot have anyone with them due to current Covid guidelines and our immunocompromised population.

## 2021-02-23 LAB — CEA: CEA: 12.7 ng/mL — ABNORMAL HIGH (ref 0.0–4.7)

## 2021-02-24 ENCOUNTER — Other Ambulatory Visit: Payer: Self-pay

## 2021-02-24 ENCOUNTER — Inpatient Hospital Stay: Payer: Medicare Other

## 2021-02-24 DIAGNOSIS — Z5112 Encounter for antineoplastic immunotherapy: Secondary | ICD-10-CM | POA: Diagnosis not present

## 2021-02-24 DIAGNOSIS — C786 Secondary malignant neoplasm of retroperitoneum and peritoneum: Secondary | ICD-10-CM

## 2021-02-24 DIAGNOSIS — Z85038 Personal history of other malignant neoplasm of large intestine: Secondary | ICD-10-CM

## 2021-02-24 MED ORDER — SODIUM CHLORIDE 0.9% FLUSH
10.0000 mL | INTRAVENOUS | Status: DC | PRN
  Administered 2021-02-24: 10 mL
  Filled 2021-02-24: qty 10

## 2021-02-24 MED ORDER — HEPARIN SOD (PORK) LOCK FLUSH 100 UNIT/ML IV SOLN
INTRAVENOUS | Status: AC
  Filled 2021-02-24: qty 5

## 2021-02-24 MED ORDER — HEPARIN SOD (PORK) LOCK FLUSH 100 UNIT/ML IV SOLN
500.0000 [IU] | Freq: Once | INTRAVENOUS | Status: AC | PRN
  Administered 2021-02-24: 500 [IU]
  Filled 2021-02-24: qty 5

## 2021-03-08 ENCOUNTER — Other Ambulatory Visit: Payer: Self-pay

## 2021-03-08 ENCOUNTER — Inpatient Hospital Stay: Payer: Medicare Other

## 2021-03-08 ENCOUNTER — Inpatient Hospital Stay (HOSPITAL_BASED_OUTPATIENT_CLINIC_OR_DEPARTMENT_OTHER): Payer: Medicare Other | Admitting: Oncology

## 2021-03-08 ENCOUNTER — Encounter: Payer: Self-pay | Admitting: Oncology

## 2021-03-08 VITALS — BP 144/78 | HR 98 | Temp 96.3°F | Wt 265.8 lb

## 2021-03-08 DIAGNOSIS — D6481 Anemia due to antineoplastic chemotherapy: Secondary | ICD-10-CM

## 2021-03-08 DIAGNOSIS — T451X5A Adverse effect of antineoplastic and immunosuppressive drugs, initial encounter: Secondary | ICD-10-CM

## 2021-03-08 DIAGNOSIS — C786 Secondary malignant neoplasm of retroperitoneum and peritoneum: Secondary | ICD-10-CM

## 2021-03-08 DIAGNOSIS — Z95828 Presence of other vascular implants and grafts: Secondary | ICD-10-CM | POA: Insufficient documentation

## 2021-03-08 DIAGNOSIS — T451X5D Adverse effect of antineoplastic and immunosuppressive drugs, subsequent encounter: Secondary | ICD-10-CM

## 2021-03-08 DIAGNOSIS — Z5111 Encounter for antineoplastic chemotherapy: Secondary | ICD-10-CM

## 2021-03-08 DIAGNOSIS — Z85038 Personal history of other malignant neoplasm of large intestine: Secondary | ICD-10-CM

## 2021-03-08 DIAGNOSIS — Z5112 Encounter for antineoplastic immunotherapy: Secondary | ICD-10-CM | POA: Diagnosis not present

## 2021-03-08 DIAGNOSIS — C189 Malignant neoplasm of colon, unspecified: Secondary | ICD-10-CM

## 2021-03-08 DIAGNOSIS — C184 Malignant neoplasm of transverse colon: Secondary | ICD-10-CM | POA: Diagnosis not present

## 2021-03-08 DIAGNOSIS — G620 Drug-induced polyneuropathy: Secondary | ICD-10-CM

## 2021-03-08 LAB — CBC WITH DIFFERENTIAL/PLATELET
Abs Immature Granulocytes: 0.02 10*3/uL (ref 0.00–0.07)
Basophils Absolute: 0.1 10*3/uL (ref 0.0–0.1)
Basophils Relative: 1 %
Eosinophils Absolute: 0.1 10*3/uL (ref 0.0–0.5)
Eosinophils Relative: 3 %
HCT: 39.3 % (ref 39.0–52.0)
Hemoglobin: 13.3 g/dL (ref 13.0–17.0)
Immature Granulocytes: 0 %
Lymphocytes Relative: 29 %
Lymphs Abs: 1.5 10*3/uL (ref 0.7–4.0)
MCH: 32.4 pg (ref 26.0–34.0)
MCHC: 33.8 g/dL (ref 30.0–36.0)
MCV: 95.6 fL (ref 80.0–100.0)
Monocytes Absolute: 0.4 10*3/uL (ref 0.1–1.0)
Monocytes Relative: 8 %
Neutro Abs: 3.1 10*3/uL (ref 1.7–7.7)
Neutrophils Relative %: 59 %
Platelets: 179 10*3/uL (ref 150–400)
RBC: 4.11 MIL/uL — ABNORMAL LOW (ref 4.22–5.81)
RDW: 13.7 % (ref 11.5–15.5)
WBC: 5.2 10*3/uL (ref 4.0–10.5)
nRBC: 0 % (ref 0.0–0.2)

## 2021-03-08 LAB — COMPREHENSIVE METABOLIC PANEL
ALT: 48 U/L — ABNORMAL HIGH (ref 0–44)
AST: 66 U/L — ABNORMAL HIGH (ref 15–41)
Albumin: 3.7 g/dL (ref 3.5–5.0)
Alkaline Phosphatase: 46 U/L (ref 38–126)
Anion gap: 8 (ref 5–15)
BUN: 10 mg/dL (ref 8–23)
CO2: 27 mmol/L (ref 22–32)
Calcium: 9.4 mg/dL (ref 8.9–10.3)
Chloride: 103 mmol/L (ref 98–111)
Creatinine, Ser: 0.79 mg/dL (ref 0.61–1.24)
GFR, Estimated: >60 mL/min (ref >60)
Glucose, Bld: 143 mg/dL — ABNORMAL HIGH (ref 70–99)
Potassium: 3.7 mmol/L (ref 3.5–5.1)
Sodium: 138 mmol/L (ref 135–145)
Total Bilirubin: 1 mg/dL (ref 0.3–1.2)
Total Protein: 6.8 g/dL (ref 6.5–8.1)

## 2021-03-08 LAB — PROTEIN, URINE, RANDOM: Total Protein, Urine: 24 mg/dL

## 2021-03-08 MED ORDER — SODIUM CHLORIDE 0.9 % IV SOLN
5.0000 mg/kg | Freq: Once | INTRAVENOUS | Status: AC
  Administered 2021-03-08: 600 mg via INTRAVENOUS
  Filled 2021-03-08: qty 16

## 2021-03-08 MED ORDER — SODIUM CHLORIDE 0.9 % IV SOLN
2400.0000 mg/m2 | INTRAVENOUS | Status: DC
  Administered 2021-03-08: 5800 mg via INTRAVENOUS
  Filled 2021-03-08: qty 116

## 2021-03-08 MED ORDER — FLUOROURACIL CHEMO INJECTION 2.5 GM/50ML
400.0000 mg/m2 | Freq: Once | INTRAVENOUS | Status: AC
  Administered 2021-03-08: 950 mg via INTRAVENOUS
  Filled 2021-03-08: qty 19

## 2021-03-08 MED ORDER — SODIUM CHLORIDE 0.9% FLUSH
10.0000 mL | Freq: Once | INTRAVENOUS | Status: AC
  Administered 2021-03-08: 10 mL via INTRAVENOUS
  Filled 2021-03-08: qty 10

## 2021-03-08 MED ORDER — SODIUM CHLORIDE 0.9 % IV SOLN
391.0000 mg/m2 | Freq: Once | INTRAVENOUS | Status: AC
  Administered 2021-03-08: 950 mg via INTRAVENOUS
  Filled 2021-03-08: qty 35

## 2021-03-08 MED ORDER — SODIUM CHLORIDE 0.9 % IV SOLN
10.0000 mg | Freq: Once | INTRAVENOUS | Status: AC
  Administered 2021-03-08: 10 mg via INTRAVENOUS
  Filled 2021-03-08: qty 10

## 2021-03-08 MED ORDER — SODIUM CHLORIDE 0.9 % IV SOLN
Freq: Once | INTRAVENOUS | Status: AC
  Filled 2021-03-08: qty 250

## 2021-03-08 MED ORDER — HEPARIN SOD (PORK) LOCK FLUSH 100 UNIT/ML IV SOLN
500.0000 [IU] | Freq: Once | INTRAVENOUS | Status: DC
  Filled 2021-03-08: qty 5

## 2021-03-08 NOTE — Progress Notes (Signed)
Urine protein not resulted yet. Per Dr Tasia Catchings okay to proceed with treatment with results pending

## 2021-03-08 NOTE — Patient Instructions (Signed)
[facility]  Discharge Instructions: Thank you for choosing [facility] to provide your oncology and hematology care.  If you have a lab appointment with the [facility], please go directly to the [facility] and check in at the registration area.  Wear comfortable clothing and clothing appropriate for easy access to any Portacath or PICC line.   We strive to give you quality time with your provider. You may need to reschedule your appointment if you arrive late (15 or more minutes).  Arriving late affects you and other patients whose appointments are after yours.  Also, if you miss three or more appointments without notifying the office, you may be dismissed from the clinic at the provider's discretion.      For prescription refill requests, have your pharmacy contact our office and allow 72 hours for refills to be completed.    Today you received the following chemotherapy and/or immunotherapy agents:Zirabev, 5FU The chemotherapy medication bag should finish at 46 hours, 96 hours, or 7 days. For example, if your pump is scheduled for 46 hours and it was put on at 4:00 p.m., it should finish at 2:00 p.m. the day it is scheduled to come off regardless of your appointment time.     Estimated time to finish at 0900am.   If the display on your pump reads "Low Volume" and it is beeping, take the batteries out of the pump and come to the cancer center for it to be taken off.   If the pump alarms go off prior to the pump reading "Low Volume" then call [phone] and someone can assist you.  If the plunger comes out and the chemotherapy medication is leaking out, please use your home chemo spill kit to clean up the spill. Do NOT use paper towels or other household products.  If you have problems or questions regarding your pump, please call either [phone] (24 hours a day) or the cancer center Monday-Friday 8:00 a.m.- 4:30 p.m. at the  clinic number and we will assist you. If you are unable to get assistance, then go to the nearest Emergency Department and ask the staff to contact the IV team for assistance.        To help prevent nausea and vomiting after your treatment, we encourage you to take your nausea medication as directed.  BELOW ARE SYMPTOMS THAT SHOULD BE REPORTED IMMEDIATELY: *FEVER GREATER THAN 100.4 F (38 C) OR HIGHER *CHILLS OR SWEATING *NAUSEA AND VOMITING THAT IS NOT CONTROLLED WITH YOUR NAUSEA MEDICATION *UNUSUAL SHORTNESS OF BREATH *UNUSUAL BRUISING OR BLEEDING *URINARY PROBLEMS (pain or burning when urinating, or frequent urination) *BOWEL PROBLEMS (unusual diarrhea, constipation, pain near the anus) TENDERNESS IN MOUTH AND THROAT WITH OR WITHOUT PRESENCE OF ULCERS (sore throat, sores in mouth, or a toothache) UNUSUAL RASH, SWELLING OR PAIN  UNUSUAL VAGINAL DISCHARGE OR ITCHING   Items with * indicate a potential emergency and should be followed up as soon as possible or go to the Emergency Department if any problems should occur.  Please show the CHEMOTHERAPY ALERT CARD or IMMUNOTHERAPY ALERT CARD at check-in to the Emergency Department and triage nurse.  Should you have questions after your visit or need to cancel or reschedule your appointment, please contact [facility]  [phone] and follow the prompts.  Office hours are 8:00 a.m. to 4:30 p.m. Monday - Friday. Please note that voicemails left after 4:00 p.m. may not be returned until the  following business day.  We are closed weekends and major holidays. You have access to a nurse at all times for urgent questions. Please call the main number to the clinic [phone] and follow the prompts.  For any non-urgent questions, you may also contact your provider using MyChart. We now offer e-Visits for anyone 17 and older to request care online for non-urgent symptoms. For details visit [email or web address].    Also download the MyChart app! Go to the app store, search "MyChart", open the app, select [facility], and log in with your MyChart username and password.  Due to Covid, a mask is required upon entering the hospital/clinic. If you do not have a mask, one will be given to you upon arrival. For doctor visits, patients may have 1 support person aged 31 or older with them. For treatment visits, patients cannot have anyone with them due to current Covid guidelines and our immunocompromised population. Fluorouracil, 5-FU injection What is this medication? FLUOROURACIL, 5-FU (flure oh YOOR a sil) is a chemotherapy drug. It slows the growth of cancer cells. This medicine is used to treat many types of cancer like breast cancer, colon or rectal cancer, pancreatic cancer, and stomach cancer. This medicine may be used for other purposes; ask your health care provider or pharmacist if you have questions. COMMON BRAND NAME(S): Adrucil What should I tell my care team before I take this medication? They need to know if you have any of these conditions: blood disorders dihydropyrimidine dehydrogenase (DPD) deficiency infection (especially a virus infection such as chickenpox, cold sores, or herpes) kidney disease liver disease malnourished, poor nutrition recent or ongoing radiation therapy an unusual or allergic reaction to fluorouracil, other chemotherapy, other medicines, foods, dyes, or preservatives pregnant or trying to get pregnant breast-feeding How should I use this medication? This drug is given as an infusion or injection into a vein. It is administered in a hospital or clinic by a specially trained health care professional. Talk to your pediatrician regarding the use of this medicine in children. Special care may be needed. Overdosage: If you think you have taken too much of this medicine contact a poison control center or emergency room at once. NOTE: This medicine is only for you. Do not  share this medicine with others. What if I miss a dose? It is important not to miss your dose. Call your doctor or health care professional if you are unable to keep an appointment. What may interact with this medication? Do not take this medicine with any of the following medications: live virus vaccines This medicine may also interact with the following medications: medicines that treat or prevent blood clots like warfarin, enoxaparin, and dalteparin This list may not describe all possible interactions. Give your health care provider a list of all the medicines, herbs, non-prescription drugs, or dietary supplements you use. Also tell them if you smoke, drink alcohol, or use illegal drugs. Some items may interact with your medicine. What should I watch for while using this medication? Visit your doctor for checks on your progress. This drug may make you feel generally unwell. This is not uncommon, as chemotherapy can affect healthy cells as well as cancer cells. Report any side effects. Continue your course of treatment even though you feel ill unless your doctor tells you to stop. In some cases, you may be given additional medicines to help with side effects. Follow all directions for their use. Call your doctor or health care professional for advice if you get  a fever, chills or sore throat, or other symptoms of a cold or flu. Do not treat yourself. This drug decreases your body's ability to fight infections. Try to avoid being around people who are sick. This medicine may increase your risk to bruise or bleed. Call your doctor or health care professional if you notice any unusual bleeding. Be careful brushing and flossing your teeth or using a toothpick because you may get an infection or bleed more easily. If you have any dental work done, tell your dentist you are receiving this medicine. Avoid taking products that contain aspirin, acetaminophen, ibuprofen, naproxen, or ketoprofen unless  instructed by your doctor. These medicines may hide a fever. Do not become pregnant while taking this medicine. Women should inform their doctor if they wish to become pregnant or think they might be pregnant. There is a potential for serious side effects to an unborn child. Talk to your health care professional or pharmacist for more information. Do not breast-feed an infant while taking this medicine. Men should inform their doctor if they wish to father a child. This medicine may lower sperm counts. Do not treat diarrhea with over the counter products. Contact your doctor if you have diarrhea that lasts more than 2 days or if it is severe and watery. This medicine can make you more sensitive to the sun. Keep out of the sun. If you cannot avoid being in the sun, wear protective clothing and use sunscreen. Do not use sun lamps or tanning beds/booths. What side effects may I notice from receiving this medication? Side effects that you should report to your doctor or health care professional as soon as possible: allergic reactions like skin rash, itching or hives, swelling of the face, lips, or tongue low blood counts - this medicine may decrease the number of white blood cells, red blood cells and platelets. You may be at increased risk for infections and bleeding. signs of infection - fever or chills, cough, sore throat, pain or difficulty passing urine signs of decreased platelets or bleeding - bruising, pinpoint red spots on the skin, black, tarry stools, blood in the urine signs of decreased red blood cells - unusually weak or tired, fainting spells, lightheadedness breathing problems changes in vision chest pain mouth sores nausea and vomiting pain, swelling, redness at site where injected pain, tingling, numbness in the hands or feet redness, swelling, or sores on hands or feet stomach pain unusual bleeding Side effects that usually do not require medical attention (report to your doctor  or health care professional if they continue or are bothersome): changes in finger or toe nails diarrhea dry or itchy skin hair loss headache loss of appetite sensitivity of eyes to the light stomach upset unusually teary eyes This list may not describe all possible side effects. Call your doctor for medical advice about side effects. You may report side effects to FDA at 1-800-FDA-1088. Where should I keep my medication? This drug is given in a hospital or clinic and will not be stored at home. NOTE: This sheet is a summary. It may not cover all possible information. If you have questions about this medicine, talk to your doctor, pharmacist, or health care provider.  2022 Elsevier/Gold Standard (2019-04-01 15:00:03) Leucovorin injection What is this medication? LEUCOVORIN (loo koe VOR in) is used to prevent or treat the harmful effects of some medicines. This medicine is used to treat anemia caused by a low amount of folic acid in the body. It is also used  with 5-fluorouracil (5-FU) to treat colon cancer. This medicine may be used for other purposes; ask your health care provider or pharmacist if you have questions. What should I tell my care team before I take this medication? They need to know if you have any of these conditions: anemia from low levels of vitamin B-12 in the blood an unusual or allergic reaction to leucovorin, folic acid, other medicines, foods, dyes, or preservatives pregnant or trying to get pregnant breast-feeding How should I use this medication? This medicine is for injection into a muscle or into a vein. It is given by a health care professional in a hospital or clinic setting. Talk to your pediatrician regarding the use of this medicine in children. Special care may be needed. Overdosage: If you think you have taken too much of this medicine contact a poison control center or emergency room at once. NOTE: This medicine is only for you. Do not share this  medicine with others. What if I miss a dose? This does not apply. What may interact with this medication? capecitabine fluorouracil phenobarbital phenytoin primidone trimethoprim-sulfamethoxazole This list may not describe all possible interactions. Give your health care provider a list of all the medicines, herbs, non-prescription drugs, or dietary supplements you use. Also tell them if you smoke, drink alcohol, or use illegal drugs. Some items may interact with your medicine. What should I watch for while using this medication? Your condition will be monitored carefully while you are receiving this medicine. This medicine may increase the side effects of 5-fluorouracil, 5-FU. Tell your doctor or health care professional if you have diarrhea or mouth sores that do not get better or that get worse. What side effects may I notice from receiving this medication? Side effects that you should report to your doctor or health care professional as soon as possible: allergic reactions like skin rash, itching or hives, swelling of the face, lips, or tongue breathing problems fever, infection mouth sores unusual bleeding or bruising unusually weak or tired Side effects that usually do not require medical attention (report to your doctor or health care professional if they continue or are bothersome): constipation or diarrhea loss of appetite nausea, vomiting This list may not describe all possible side effects. Call your doctor for medical advice about side effects. You may report side effects to FDA at 1-800-FDA-1088. Where should I keep my medication? This drug is given in a hospital or clinic and will not be stored at home. NOTE: This sheet is a summary. It may not cover all possible information. If you have questions about this medicine, talk to your doctor, pharmacist, or health care provider.  2022 Elsevier/Gold Standard (2007-11-05 16:50:29) Bevacizumab injection What is this  medication? BEVACIZUMAB (be va SIZ yoo mab) is a monoclonal antibody. It is used to treat many types of cancer. This medicine may be used for other purposes; ask your health care provider or pharmacist if you have questions. COMMON BRAND NAME(S): Avastin, MVASI, Noah Charon What should I tell my care team before I take this medication? They need to know if you have any of these conditions: diabetes heart disease high blood pressure history of coughing up blood prior anthracycline chemotherapy (e.g., doxorubicin, daunorubicin, epirubicin) recent or ongoing radiation therapy recent or planning to have surgery stroke an unusual or allergic reaction to bevacizumab, hamster proteins, mouse proteins, other medicines, foods, dyes, or preservatives pregnant or trying to get pregnant breast-feeding How should I use this medication? This medicine is for infusion into  a vein. It is given by a health care professional in a hospital or clinic setting. Talk to your pediatrician regarding the use of this medicine in children. Special care may be needed. Overdosage: If you think you have taken too much of this medicine contact a poison control center or emergency room at once. NOTE: This medicine is only for you. Do not share this medicine with others. What if I miss a dose? It is important not to miss your dose. Call your doctor or health care professional if you are unable to keep an appointment. What may interact with this medication? Interactions are not expected. This list may not describe all possible interactions. Give your health care provider a list of all the medicines, herbs, non-prescription drugs, or dietary supplements you use. Also tell them if you smoke, drink alcohol, or use illegal drugs. Some items may interact with your medicine. What should I watch for while using this medication? Your condition will be monitored carefully while you are receiving this medicine. You will need important  blood work and urine testing done while you are taking this medicine. This medicine may increase your risk to bruise or bleed. Call your doctor or health care professional if you notice any unusual bleeding. Before having surgery, talk to your health care provider to make sure it is ok. This drug can increase the risk of poor healing of your surgical site or wound. You will need to stop this drug for 28 days before surgery. After surgery, wait at least 28 days before restarting this drug. Make sure the surgical site or wound is healed enough before restarting this drug. Talk to your health care provider if questions. Do not become pregnant while taking this medicine or for 6 months after stopping it. Women should inform their doctor if they wish to become pregnant or think they might be pregnant. There is a potential for serious side effects to an unborn child. Talk to your health care professional or pharmacist for more information. Do not breast-feed an infant while taking this medicine and for 6 months after the last dose. This medicine has caused ovarian failure in some women. This medicine may interfere with the ability to have a child. You should talk to your doctor or health care professional if you are concerned about your fertility. What side effects may I notice from receiving this medication? Side effects that you should report to your doctor or health care professional as soon as possible: allergic reactions like skin rash, itching or hives, swelling of the face, lips, or tongue chest pain or chest tightness chills coughing up blood high fever seizures severe constipation signs and symptoms of bleeding such as bloody or black, tarry stools; red or dark-brown urine; spitting up blood or brown material that looks like coffee grounds; red spots on the skin; unusual bruising or bleeding from the eye, gums, or nose signs and symptoms of a blood clot such as breathing problems; chest pain;  severe, sudden headache; pain, swelling, warmth in the leg signs and symptoms of a stroke like changes in vision; confusion; trouble speaking or understanding; severe headaches; sudden numbness or weakness of the face, arm or leg; trouble walking; dizziness; loss of balance or coordination stomach pain sweating swelling of legs or ankles vomiting weight gain Side effects that usually do not require medical attention (report to your doctor or health care professional if they continue or are bothersome): back pain changes in taste decreased appetite dry skin nausea tiredness  This list may not describe all possible side effects. Call your doctor for medical advice about side effects. You may report side effects to FDA at 1-800-FDA-1088. Where should I keep my medication? This drug is given in a hospital or clinic and will not be stored at home. NOTE: This sheet is a summary. It may not cover all possible information. If you have questions about this medicine, talk to your doctor, pharmacist, or health care provider.  2022 Elsevier/Gold Standard (2019-02-26 10:50:46)

## 2021-03-08 NOTE — Progress Notes (Signed)
Hematology Oncology Progress Note   Clinic Day: 03/08/2021   Referring physician: Tracie Harrier, MD  Chief Complaint: Jared Gutierrez is a 80 y.o. male presents for follow-up of metastatic colon cancer   PERTINENT ONCOLOGY HISTORY Jared Gutierrez is a 80 y.o.[place] who has above oncology history reviewed by me today presented for follow up visit for management of  Metastatic colon cancer. [name], patient switched care to me on 11/11/20 Extensive medical record review was performed by me  # Metastatic colon cancer.  he was diagnosed with stage I colon cancer s/p transverse colectomy on 10/17/2013.  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  Tumor extended into the submucosa. Margins were negative. + lymphovascular invasion. 14 lymph nodes were negative. Pathologic stage was T1 N0.   10/02/2014 Colonoscopy showed several 3 mm polyps and an 8 mm polyp in the cecum and transverse colon.  Pathology revealed tubular adenomas negative for high-grade dysplasia and malignancy.  02/16/2017 Colonoscopy  revealed 2 diminutive polyps in the descending colon.  Pathology revealed tubular adenomas without dysplasia or malignancy.  Her CEA has been monitored and was noted to increase starting July 2020. 04/02/2019  PET scan  limited evealed a 2.4 x 2.3 cm (SUV 11) hypermetabolic soft tissue density caudal and anterior to the pancreatic neck favored to represent isolated peritoneal or nodal metastasis in the setting of prior transverse colonic resection (expected primary drainage). Although this was immediately adjacent to the pancreas, a fat plane was maintained, arguing strongly against a pancreatic primary. Otherwise, there was no evidence of hypermetabolic metastasis.   04/17/2019 EUS on  revealed a normal esophagus, stomach, duodenum, and pancreas.  There was a  2.4 x 2.4 cm irregular mass in the retroperitoneum adjacent to, but not involving the pancreatic neck.  FNA and core needle biopsy were performed.  Pathology revealed adenocarcinoma compatible with a metastatic lesion of colorectal origin.  Tumor cells were positive for CK20 and CDX2 and negative for CK7.   NGS: [employer] on 05/15/2019 revealed + KRASG13D and TP53.  Negative results included BRAF V600E, Her2, NRAS, NTRK, PD-L1 (<1%), and TMB 8.7/Mb (intermediate).  MMR testing from his colon resection on 10/16/2013 was intact with a low probability of MSI-H.  06/25/2019, CEA 17.8. 07/09/2019 - 08/27/2019; 09/17/2019 - 10/15/2019; 11/12/2019 - 12/17/2019 He received 11 cycles of FOLFOX chemotherapy and 1 cycle of 5-FU and leucovorin (12/31/2019).  He received Neulasta after cycle #4 and #5 secondary to progressive leukopenia.  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  He received a truncated course of FOLFOX with cycle #11 secondary to oxaliplatin reaction.  08/27/2019, CEA 6.1 10/29/2019, CEA 7.7 12/31/2019, CEA 10.8.  01/14/2020 PET revealed an interval decrease in size and FDG uptake (2.4 x 2.3 cm with SUV 11 to 2.4 x 1.7 cm with SUV 4.09) associated with the previously referenced soft tissue density caudal and anterior to the pancreatic neck suggesting treatment response. There were no new sites of FDG avid tumor.  Radiation treatment 02/10/2020 - 02/23/2020.   He received a hybrid 3-dimensional course of 3000 cGy in 10 fractions to the residual FDG avid mass   05/27/2020, CEA 11.6 05/27/2020 Abdomen and pelvis CT :stable to minimally increased (2.9 x 1.8 cm compared to 2.4 x 1.7 cm) since 01/14/2020.  There were no new interval findings.    08/31/2020, CEA 30.9. 08/31/2020 Abdomen and pelvis CT revealed increased size of  the index soft tissue lesion inferior and anterior to the pancreatic neck (2.9 x 1.8 cm to 3.5 x 2.9 cm). There were similar prominent retroperitoneal lymph nodes without  adenopathy by size criteria. There was no new or enlarging abdominal or pelvic lymph nodes. There were no new interval findings. There was similar circumferential wall thickening of a nondistended urinary bladder, which likely accentuated wall thickening There was hepatic steatosis and aortic atherosclerosis.  11/03/2020, PET showed recurrent peritoneal metastasis in the upper abdomen adjacent to the pancreas.  The lesion is 3.8 x 2.9 cm with SUV of 11.3. No evidence of metastatic peritoneal disease elsewhere in the abdomen pelvis.  No evidence of distant metastasis.  Other medical problems Chronic lower extremity edema. 12/24/2018, right lower extremity duplex negative for DVT.  Small right Baker's cyst. 08/16/2019, bilateral lower extremity duplex showed no DVT.  08/16/2019 - 08/17/2019 with right lower extremity cellulitis.  He was unable to bear weight.  He was treated with IV fluids, NSAIDs, colchicine, and broad antibiotics (vancomycin and Cefepime).  He was discharged on indomethacin x 5 days and Keflex 500 mg TID x 5 days.  10/05/2020, colonoscopy showed internal hemorrhoids.  Otherwise normal examination. 11/03/2020 PET scan showed recurrent peritoneal metastasis near pancreas. Given that he has no other distant metastasis.  Repeat SBRT may be considered if he does not respond well to systemic chemotherapy.  Discussed with radiation oncology.  11/26/2020 patient was started on 5-FU and bevacizumab. 12/14/2020 CEA 29.5 02/17/2021 CT abdomen pelvis showed partial response. Mild decrease of peritoneal lesion.  Proceed with 5-FU and bevacizumab today.  Given that he is tolerating current regimen with good life quality, partial response.  Shared decision was made to hold off adding additional agents.  INTERVAL HISTORY Jared Gutierrez is a 80 y.o. male who has above history reviewed by me today presents for follow up visit for management of recurrent metastatic colon cancer Problems and complaints  are listed below: he was accompanied by wife Patient tolerated  5-FU and bevacizumab.  He has no new complaints. 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  Patient was given gabapentin which he is not taking currently due to the concern of possible side effects.    Review of Systems  Constitutional:  Negative for appetite change, chills, diaphoresis, fever and unexpected weight change.  HENT:   Negative for hearing loss, nosebleeds, sore throat and tinnitus.   Respiratory:  Positive for shortness of breath. Negative for cough and hemoptysis.   Cardiovascular:  Positive for leg swelling. Negative for chest pain and palpitations.  Gastrointestinal:  Negative for abdominal pain, blood in stool, constipation, diarrhea, nausea and vomiting.  Genitourinary:  Negative for dysuria, frequency and hematuria.   Musculoskeletal:  Negative for back pain, myalgias and neck pain.  Skin:  Negative for itching and rash.  Neurological:  Negative for dizziness and headaches.  Hematological:  Does not bruise/bleed easily.  Psychiatric/Behavioral:  Negative for depression. The patient is not nervous/anxious.      Past Medical History:  Diagnosis Date   Arthritis    OSTEOARTHRITIS   Cancer ([place])    Cavitary lesion of lung    RIGHT LOWER LOBE   Chicken pox    Colon cancer ([facility])    History of kidney stones    Hypertension    Lipoma of colon    Nephrolithiasis    Nephrolithiasis    Obesity  Shingles    Tubular adenoma of colon    multiple fragments    Past Surgical History:  Procedure Laterality Date   COLON SURGERY     COLONOSCOPY N/A 10/02/2014   Procedure: COLONOSCOPY;  Surgeon: Josefine Class, MD;  Location[facility] ENDOSCOPY;  Service: Endoscopy;  Laterality: N/A;   COLONOSCOPY WITH PROPOFOL N/A 02/16/2017   Procedure: COLONOSCOPY WITH PROPOFOL;   Surgeon: Manya Silvas, MD;  Location: [facility] ENDOSCOPY;  Service: Endoscopy;  Laterality: N/A;   COLONOSCOPY WITH PROPOFOL N/A 10/05/2020   Procedure: COLONOSCOPY WITH PROPOFOL;  Surgeon: Lesly Rubenstein, MD;  Location: [facility] ENDOSCOPY;  Service: Endoscopy;  Laterality: N/A;   EUS N/A 04/17/2019   Procedure: FULL UPPER ENDOSCOPIC ULTRASOUND (EUS) RADIAL;  Surgeon: Holly Bodily, MD;  Location: [facility] ENDOSCOPY;  Service: Gastroenterology;  Laterality: N/A;   KIDNEY STONE SURGERY     PARTIAL COLECTOMY  10/17/2013   PORTACATH PLACEMENT Right 06/13/2019   Procedure: INSERTION PORT-A-CATH;  Surgeon: Benjamine Sprague, DO;  Location: [facility] ORS;  Service: General;  Laterality: Right;    Family History  Problem Relation Age of Onset   Cancer Mother    Breast cancer Mother    COPD Father     Social History:  reports that he has never smoked. He has never used smokeless tobacco. He reports current alcohol use. He reports that he does not use drugs. 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 He is married and his wife's name is [name] [phone]).  The patient is alone today.  Allergies: No Known Allergies  Current Medications: Current Outpatient Medications  Medication Sig Dispense Refill   acetaminophen (TYLENOL) 500 MG tablet Take 500 mg by mouth every 6 (six) hours as needed.     amLODipine (NORVASC) 2.5 MG tablet Take 2.5 mg by mouth daily.     aspirin EC 81 MG tablet Take 81 mg by mouth daily.     Cholecalciferol (VITAMIN D) 125 MCG (5000 UT) CAPS Take 5,000 mg by mouth.     docusate sodium (COLACE) 100 MG capsule Take 100 mg by mouth 2 (two) times daily.     lidocaine-prilocaine (EMLA) cream Apply to affected area once 30 g 3   loratadine (CLARITIN) 10 MG tablet Take 10 mg by mouth daily.     olmesartan (BENICAR) 20 MG tablet Take 1 tablet by mouth daily.     ondansetron (ZOFRAN) 8 MG tablet Take 1 tablet (8 mg total) by mouth 2 (two) times daily as needed  for refractory nausea / vomiting. Start on day 3 after chemotherapy. 60 tablet 1   Probiotic Product (PROBIOTIC DAILY PO) Take 1 tablet by mouth daily.     prochlorperazine (COMPAZINE) 10 MG tablet Take 1 tablet (10 mg total) by mouth every 6 (six) hours as needed (NAUSEA). 30 tablet 1   gabapentin (NEURONTIN) 100 MG capsule Take 1 capsule (100 mg total) by mouth 3 (three) times daily. (Patient not taking: No sig reported) 90 capsule 0   HYDROcodone-acetaminophen (NORCO/VICODIN) 5-325 MG tablet Take 1 tablet by mouth every 6 (six) hours as needed for moderate pain (up to 3 doses for moderate pain.). (Patient not taking: No sig reported)     NEOMYCIN-POLYMYXIN-HYDROCORTISONE (CORTISPORIN) 1 % SOLN OTIC solution SMARTSIG:Left Ear (Patient not taking: No sig reported)     potassium chloride SA (KLOR-CON) 20 MEQ tablet Take by mouth. (Patient not taking: No sig reported)     No current  facility-administered medications for this visit.   Facility-Administered Medications Ordered in Other Visits  Medication Dose Route Frequency Provider Last Rate Last Admin   0.9 %  sodium chloride infusion   Intravenous Once Corcoran, Melissa C, MD       0.9 %  sodium chloride infusion   Intravenous Continuous Lequita Asal, MD 10 mL/hr at 12/31/19 1000 New Bag at 10/05/20 1045   fluorouracil (ADRUCIL) 5,800 mg in sodium chloride 0.9 % 134 mL chemo infusion  2,400 mg/m2 (Order-Specific) Intravenous 1 day or 1 dose [name], MD   5,800 mg at 03/08/21 1113   heparin lock flush 100 unit/mL  500 Units Intravenous Once Lequita Asal, MD       heparin lock flush 100 unit/mL  500 Units Intravenous Once [name], MD        Performance status (ECOG): 1  Vitals Blood pressure (!) 144/78, pulse 98, temperature (!) 96.3 F (35.7 C), temperature source Tympanic, weight 265 lb 12.8 oz (120.6 kg).   Physical Exam Vitals and nursing note reviewed.  Constitutional:      General: He is not in acute distress.     Appearance: He is well-developed. He is obese. He is not diaphoretic.     Interventions: Face mask in place.     Comments: Patient walks with a cane  HENT:     Head: Normocephalic and atraumatic.     Mouth/Throat:     Mouth: Mucous membranes are moist.     Pharynx: Oropharynx is clear.  Eyes:     General: No scleral icterus.    Extraocular Movements: Extraocular movements intact.     Conjunctiva/sclera: Conjunctivae normal.     Pupils: Pupils are equal, round, and reactive to light.  Cardiovascular:     Rate and Rhythm: Normal rate and regular rhythm.     Heart sounds: Normal heart sounds. No murmur heard. Pulmonary:     Effort: Pulmonary effort is normal. No respiratory distress.     Breath sounds: No wheezing or rales.     Comments: Decreased breath sound bilaterally Chest:     Chest wall: No tenderness.  Abdominal:     General: Bowel sounds are normal. There is no distension.     Palpations: Abdomen is soft. There is no mass.     Tenderness: There is no abdominal tenderness. There is no guarding or rebound.  Musculoskeletal:        General: No swelling or tenderness. Normal range of motion.     Cervical back: Normal range of motion and neck supple.     Right lower leg: Edema present.     Left lower leg: Edema present.  Lymphadenopathy:     Head:     Right side of head: No preauricular, posterior auricular or occipital adenopathy.     Left side of head: No preauricular, posterior auricular or occipital adenopathy.     Cervical: No cervical adenopathy.     Upper Body:     Right upper body: No supraclavicular or axillary adenopathy.     Left upper body: No supraclavicular or axillary adenopathy.     Lower Body: No right inguinal adenopathy. No left inguinal adenopathy.  Skin:    General: Skin is warm and dry.  Neurological:     General: No focal deficit present.     Mental Status: He is alert and oriented to person, place, and time. Mental status is at baseline.   Psychiatric:  Mood and Affect: Mood normal.   Infusion on 03/08/2021  Component Date Value Ref Range Status   Sodium 03/08/2021 138  135 - 145 mmol/L Final   Potassium 03/08/2021 3.7  3.5 - 5.1 mmol/L Final   Chloride 03/08/2021 103  98 - 111 mmol/L Final   CO2 03/08/2021 27  22 - 32 mmol/L Final   Glucose, Bld 03/08/2021 143 (A)  70 - 99 mg/dL Final   Glucose reference range applies only to samples taken after fasting for at least 8 hours.   BUN 03/08/2021 10  8 - 23 mg/dL Final   Creatinine, Ser 03/08/2021 0.79  0.61 - 1.24 mg/dL Final   Calcium 03/08/2021 9.4  8.9 - 10.3 mg/dL Final   Total Protein 03/08/2021 6.8  6.5 - 8.1 g/dL Final   Albumin 03/08/2021 3.7  3.5 - 5.0 g/dL Final   AST 03/08/2021 66 (A)  15 - 41 U/L Final   ALT 03/08/2021 48 (A)  0 - 44 U/L Final   Alkaline Phosphatase 03/08/2021 46  38 - 126 U/L Final   Total Bilirubin 03/08/2021 1.0  0.3 - 1.2 mg/dL Final   GFR, Estimated 03/08/2021 >60  >60 mL/min Final   Comment: (NOTE) Calculated using the CKD-EPI Creatinine Equation (2021)    Anion gap 03/08/2021 8  5 - 15 Final   Performed at [facility], [place], [place] [address]   WBC 03/08/2021 5.2  4.0 - 10.5 K/uL Final   RBC 03/08/2021 4.11 (A)  4.22 - 5.81 MIL/uL Final   Hemoglobin 03/08/2021 13.3  13.0 - 17.0 g/dL Final   HCT 03/08/2021 39.3  39.0 - 52.0 % Final   MCV 03/08/2021 95.6  80.0 - 100.0 fL Final   MCH 03/08/2021 32.4  26.0 - 34.0 pg Final   MCHC 03/08/2021 33.8  30.0 - 36.0 g/dL Final   RDW 03/08/2021 13.7  11.5 - 15.5 % Final   Platelets 03/08/2021 179  150 - 400 K/uL Final   nRBC 03/08/2021 0.0  0.0 - 0.2 % Final   Neutrophils Relative % 03/08/2021 59  % Final   Neutro Abs 03/08/2021 3.1  1.7 - 7.7 K/uL Final   Lymphocytes Relative 03/08/2021 29  % Final   Lymphs Abs 03/08/2021 1.5  0.7 - 4.0 K/uL Final   Monocytes Relative 03/08/2021 8  % Final   Monocytes Absolute 03/08/2021 0.4  0.1 - 1.0 K/uL Final    Eosinophils Relative 03/08/2021 3  % Final   Eosinophils Absolute 03/08/2021 0.1  0.0 - 0.5 K/uL Final   Basophils Relative 03/08/2021 1  % Final   Basophils Absolute 03/08/2021 0.1  0.0 - 0.1 K/uL Final   Immature Granulocytes 03/08/2021 0  % Final   Abs Immature Granulocytes 03/08/2021 0.02  0.00 - 0.07 K/uL Final   Performed at [facility], [address] [place], [place] [address]   Total Protein, Urine 03/08/2021 24  mg/dL Final   Comment: NO NORMAL RANGE ESTABLISHED FOR THIS TEST Performed at [facility], [address] [place], [place] [address]     Assessment and plan 1. Encounter for antineoplastic chemotherapy   2. Anemia due to chemotherapy   3. Port-A-Cath in place   4. 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 Cancer of transverse colon ([place])   6. Chemotherapy-induced neuropathy ([place])    #Metastatic colon cancer-recurrent Labs are reviewed and discussed with patient. Proceed with 5-FU and bevacizumab today.   # Chemotherapy induced  neuropathy Patient has supply of gabapentin and does not want to use due to concern of side effects.  Previously discussed about acupuncture option. Patient prefers to defer that for now and he will let me know if he wants to try acupuncture.   Follow-up 2 weeks lab MD 5-FU/bevacizumab   I discussed the assessment and treatment plan with the patient.  The patient was provided an opportunity to ask questions and all were answered.  The patient agreed with the plan and demonstrated an understanding of the instructions.  The patient was advised to call back if the symptoms worsen or if the condition fails to improve as anticipated.  [name], MD, PhD 03/08/2021

## 2021-03-09 LAB — CEA: CEA: 13.1 ng/mL — ABNORMAL HIGH (ref 0.0–4.7)

## 2021-03-10 ENCOUNTER — Other Ambulatory Visit: Payer: Self-pay

## 2021-03-10 ENCOUNTER — Inpatient Hospital Stay: Payer: Medicare Other

## 2021-03-10 DIAGNOSIS — C786 Secondary malignant neoplasm of retroperitoneum and peritoneum: Secondary | ICD-10-CM

## 2021-03-10 DIAGNOSIS — Z5112 Encounter for antineoplastic immunotherapy: Secondary | ICD-10-CM | POA: Diagnosis not present

## 2021-03-10 DIAGNOSIS — Z85038 Personal history of other malignant neoplasm of large intestine: Secondary | ICD-10-CM

## 2021-03-10 MED ORDER — SODIUM CHLORIDE 0.9% FLUSH
10.0000 mL | INTRAVENOUS | Status: DC | PRN
  Administered 2021-03-10: 10 mL
  Filled 2021-03-10: qty 10

## 2021-03-10 MED ORDER — HEPARIN SOD (PORK) LOCK FLUSH 100 UNIT/ML IV SOLN
INTRAVENOUS | Status: AC
  Administered 2021-03-10: 500 [IU]
  Filled 2021-03-10: qty 5

## 2021-03-10 MED ORDER — HEPARIN SOD (PORK) LOCK FLUSH 100 UNIT/ML IV SOLN
500.0000 [IU] | Freq: Once | INTRAVENOUS | Status: AC | PRN
  Filled 2021-03-10: qty 5

## 2021-03-22 ENCOUNTER — Inpatient Hospital Stay (HOSPITAL_BASED_OUTPATIENT_CLINIC_OR_DEPARTMENT_OTHER): Payer: Medicare Other | Admitting: Oncology

## 2021-03-22 ENCOUNTER — Encounter: Payer: Self-pay | Admitting: Oncology

## 2021-03-22 ENCOUNTER — Inpatient Hospital Stay: Payer: Medicare Other

## 2021-03-22 ENCOUNTER — Inpatient Hospital Stay: Payer: Medicare Other | Attending: Oncology

## 2021-03-22 ENCOUNTER — Other Ambulatory Visit: Payer: Self-pay

## 2021-03-22 VITALS — BP 123/79 | HR 75 | Temp 97.8°F | Resp 18 | Wt 264.5 lb

## 2021-03-22 DIAGNOSIS — Z809 Family history of malignant neoplasm, unspecified: Secondary | ICD-10-CM | POA: Diagnosis not present

## 2021-03-22 DIAGNOSIS — T451X5D Adverse effect of antineoplastic and immunosuppressive drugs, subsequent encounter: Secondary | ICD-10-CM | POA: Insufficient documentation

## 2021-03-22 DIAGNOSIS — C189 Malignant neoplasm of colon, unspecified: Secondary | ICD-10-CM | POA: Diagnosis not present

## 2021-03-22 DIAGNOSIS — Z836 Family history of other diseases of the respiratory system: Secondary | ICD-10-CM | POA: Insufficient documentation

## 2021-03-22 DIAGNOSIS — Z5112 Encounter for antineoplastic immunotherapy: Secondary | ICD-10-CM | POA: Diagnosis not present

## 2021-03-22 DIAGNOSIS — Z79899 Other long term (current) drug therapy: Secondary | ICD-10-CM | POA: Diagnosis not present

## 2021-03-22 DIAGNOSIS — D6481 Anemia due to antineoplastic chemotherapy: Secondary | ICD-10-CM | POA: Diagnosis not present

## 2021-03-22 DIAGNOSIS — Z86010 Personal history of colonic polyps: Secondary | ICD-10-CM | POA: Diagnosis not present

## 2021-03-22 DIAGNOSIS — C786 Secondary malignant neoplasm of retroperitoneum and peritoneum: Secondary | ICD-10-CM | POA: Diagnosis not present

## 2021-03-22 DIAGNOSIS — Z803 Family history of malignant neoplasm of breast: Secondary | ICD-10-CM | POA: Diagnosis not present

## 2021-03-22 DIAGNOSIS — G620 Drug-induced polyneuropathy: Secondary | ICD-10-CM | POA: Insufficient documentation

## 2021-03-22 DIAGNOSIS — Z5111 Encounter for antineoplastic chemotherapy: Secondary | ICD-10-CM

## 2021-03-22 DIAGNOSIS — C184 Malignant neoplasm of transverse colon: Secondary | ICD-10-CM | POA: Insufficient documentation

## 2021-03-22 DIAGNOSIS — Z85038 Personal history of other malignant neoplasm of large intestine: Secondary | ICD-10-CM

## 2021-03-22 LAB — COMPREHENSIVE METABOLIC PANEL
ALT: 37 U/L (ref 0–44)
AST: 47 U/L — ABNORMAL HIGH (ref 15–41)
Albumin: 3.7 g/dL (ref 3.5–5.0)
Alkaline Phosphatase: 45 U/L (ref 38–126)
Anion gap: 8 (ref 5–15)
BUN: 12 mg/dL (ref 8–23)
CO2: 26 mmol/L (ref 22–32)
Calcium: 9.4 mg/dL (ref 8.9–10.3)
Chloride: 104 mmol/L (ref 98–111)
Creatinine, Ser: 0.7 mg/dL (ref 0.61–1.24)
GFR, Estimated: >60 mL/min (ref >60)
Glucose, Bld: 128 mg/dL — ABNORMAL HIGH (ref 70–99)
Potassium: 3.6 mmol/L (ref 3.5–5.1)
Sodium: 138 mmol/L (ref 135–145)
Total Bilirubin: 1.1 mg/dL (ref 0.3–1.2)
Total Protein: 6.8 g/dL (ref 6.5–8.1)

## 2021-03-22 LAB — CBC WITH DIFFERENTIAL/PLATELET
Abs Immature Granulocytes: 0.01 10*3/uL (ref 0.00–0.07)
Basophils Absolute: 0 10*3/uL (ref 0.0–0.1)
Basophils Relative: 1 %
Eosinophils Absolute: 0.1 10*3/uL (ref 0.0–0.5)
Eosinophils Relative: 2 %
HCT: 38.7 % — ABNORMAL LOW (ref 39.0–52.0)
Hemoglobin: 13 g/dL (ref 13.0–17.0)
Immature Granulocytes: 0 %
Lymphocytes Relative: 29 %
Lymphs Abs: 1.4 10*3/uL (ref 0.7–4.0)
MCH: 32.3 pg (ref 26.0–34.0)
MCHC: 33.6 g/dL (ref 30.0–36.0)
MCV: 96 fL (ref 80.0–100.0)
Monocytes Absolute: 0.5 10*3/uL (ref 0.1–1.0)
Monocytes Relative: 11 %
Neutro Abs: 2.8 10*3/uL (ref 1.7–7.7)
Neutrophils Relative %: 57 %
Platelets: 161 10*3/uL (ref 150–400)
RBC: 4.03 MIL/uL — ABNORMAL LOW (ref 4.22–5.81)
RDW: 13.9 % (ref 11.5–15.5)
WBC: 4.8 10*3/uL (ref 4.0–10.5)
nRBC: 0 % (ref 0.0–0.2)

## 2021-03-22 LAB — PROTEIN, URINE, RANDOM: Total Protein, Urine: 23 mg/dL

## 2021-03-22 MED ORDER — SODIUM CHLORIDE 0.9 % IV SOLN
Freq: Once | INTRAVENOUS | Status: AC
  Filled 2021-03-22: qty 250

## 2021-03-22 MED ORDER — SODIUM CHLORIDE 0.9 % IV SOLN
950.0000 mg | Freq: Once | INTRAVENOUS | Status: AC
  Administered 2021-03-22: 950 mg via INTRAVENOUS
  Filled 2021-03-22: qty 47.5

## 2021-03-22 MED ORDER — SODIUM CHLORIDE 0.9 % IV SOLN
5.0000 mg/kg | Freq: Once | INTRAVENOUS | Status: AC
  Administered 2021-03-22: 600 mg via INTRAVENOUS
  Filled 2021-03-22: qty 16

## 2021-03-22 MED ORDER — FLUOROURACIL CHEMO INJECTION 2.5 GM/50ML
400.0000 mg/m2 | Freq: Once | INTRAVENOUS | Status: AC
  Administered 2021-03-22: 950 mg via INTRAVENOUS
  Filled 2021-03-22: qty 19

## 2021-03-22 MED ORDER — SODIUM CHLORIDE 0.9 % IV SOLN
2400.0000 mg/m2 | INTRAVENOUS | Status: DC
  Administered 2021-03-22: 5800 mg via INTRAVENOUS
  Filled 2021-03-22: qty 116

## 2021-03-22 MED ORDER — SODIUM CHLORIDE 0.9 % IV SOLN
10.0000 mg | Freq: Once | INTRAVENOUS | Status: AC
  Administered 2021-03-22: 10 mg via INTRAVENOUS
  Filled 2021-03-22: qty 10

## 2021-03-22 MED ORDER — SODIUM CHLORIDE 0.9% FLUSH
10.0000 mL | INTRAVENOUS | Status: DC | PRN
  Administered 2021-03-22: 10 mL via INTRAVENOUS
  Filled 2021-03-22: qty 10

## 2021-03-22 NOTE — Progress Notes (Signed)
Hematology Oncology Progress Note   Clinic Day: 03/22/2021   Referring physician: Tracie Harrier, MD  Chief Complaint: Jared Gutierrez is a 80 y.o. male presents for follow-up of metastatic colon cancer   PERTINENT ONCOLOGY HISTORY Jared Gutierrez is a 80 y.o.[place] who has above oncology history reviewed by me today presented for follow up visit for management of  Metastatic colon cancer. [name], patient switched care to me on 11/11/20 Extensive medical record review was performed by me  # Metastatic colon cancer.  he was diagnosed with stage I colon cancer s/p transverse colectomy on 10/17/2013.  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  Tumor extended into the submucosa. Margins were negative. + lymphovascular invasion. 14 lymph nodes were negative. Pathologic stage was T1 N0.   10/02/2014 Colonoscopy showed several 3 mm polyps and an 8 mm polyp in the cecum and transverse colon.  Pathology revealed tubular adenomas negative for high-grade dysplasia and malignancy.  02/16/2017 Colonoscopy  revealed 2 diminutive polyps in the descending colon.  Pathology revealed tubular adenomas without dysplasia or malignancy.  Her CEA has been monitored and was noted to increase starting July 2020. 04/02/2019  PET scan  limited evealed a 2.4 x 2.3 cm (SUV 11) hypermetabolic soft tissue density caudal and anterior to the pancreatic neck favored to represent isolated peritoneal or nodal metastasis in the setting of prior transverse colonic resection (expected primary drainage). Although this was immediately adjacent to the pancreas, a fat plane was maintained, arguing strongly against a pancreatic primary. Otherwise, there was no evidence of hypermetabolic metastasis.   04/17/2019 EUS on  revealed a normal esophagus, stomach, duodenum, and pancreas.  There was a  2.4 x 2.4 cm irregular mass in the retroperitoneum adjacent to, but not involving the pancreatic neck.  FNA and core needle biopsy were performed.  Pathology revealed adenocarcinoma compatible with a metastatic lesion of colorectal origin.  Tumor cells were positive for CK20 and CDX2 and negative for CK7.   NGS: [employer] on 05/15/2019 revealed + KRASG13D and TP53.  Negative results included BRAF V600E, Her2, NRAS, NTRK, PD-L1 (<1%), and TMB 8.7/Mb (intermediate).  MMR testing from his colon resection on 10/16/2013 was intact with a low probability of MSI-H.  06/25/2019, CEA 17.8. 07/09/2019 - 08/27/2019; 09/17/2019 - 10/15/2019; 11/12/2019 - 12/17/2019 He received 11 cycles of FOLFOX chemotherapy and 1 cycle of 5-FU and leucovorin (12/31/2019).  He received Neulasta after cycle #4 and #5 secondary to progressive leukopenia.  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  He received a truncated course of FOLFOX with cycle #11 secondary to oxaliplatin reaction.  08/27/2019, CEA 6.1 10/29/2019, CEA 7.7 12/31/2019, CEA 10.8.  01/14/2020 PET revealed an interval decrease in size and FDG uptake (2.4 x 2.3 cm with SUV 11 to 2.4 x 1.7 cm with SUV 4.09) associated with the previously referenced soft tissue density caudal and anterior to the pancreatic neck suggesting treatment response. There were no new sites of FDG avid tumor.  Radiation treatment 02/10/2020 - 02/23/2020.   He received a hybrid 3-dimensional course of 3000 cGy in 10 fractions to the residual FDG avid mass   05/27/2020, CEA 11.6 05/27/2020 Abdomen and pelvis CT :stable to minimally increased (2.9 x 1.8 cm compared to 2.4 x 1.7 cm) since 01/14/2020.  There were no new interval findings.    08/31/2020, CEA 30.9. 08/31/2020 Abdomen and pelvis CT revealed increased size of  the index soft tissue lesion inferior and anterior to the pancreatic neck (2.9 x 1.8 cm to 3.5 x 2.9 cm). There were similar prominent retroperitoneal lymph nodes without  adenopathy by size criteria. There was no new or enlarging abdominal or pelvic lymph nodes. There were no new interval findings. There was similar circumferential wall thickening of a nondistended urinary bladder, which likely accentuated wall thickening There was hepatic steatosis and aortic atherosclerosis.  11/03/2020, PET showed recurrent peritoneal metastasis in the upper abdomen adjacent to the pancreas.  The lesion is 3.8 x 2.9 cm with SUV of 11.3. No evidence of metastatic peritoneal disease elsewhere in the abdomen pelvis.  No evidence of distant metastasis.  Other medical problems Chronic lower extremity edema. 12/24/2018, right lower extremity duplex negative for DVT.  Small right Baker's cyst. 08/16/2019, bilateral lower extremity duplex showed no DVT.  08/16/2019 - 08/17/2019 with right lower extremity cellulitis.  He was unable to bear weight.  He was treated with IV fluids, NSAIDs, colchicine, and broad antibiotics (vancomycin and Cefepime).  He was discharged on indomethacin x 5 days and Keflex 500 mg TID x 5 days.  10/05/2020, colonoscopy showed internal hemorrhoids.  Otherwise normal examination. 11/03/2020 PET scan showed recurrent peritoneal metastasis near pancreas. Given that he has no other distant metastasis.  Repeat SBRT may be considered if he does not respond well to systemic chemotherapy.  Discussed with radiation oncology.  11/26/2020 patient was started on 5-FU and bevacizumab. 12/14/2020 CEA 29.5 02/17/2021 CT abdomen pelvis showed partial response. Mild decrease of peritoneal lesion.  Proceed with 5-FU and bevacizumab today.  Given that he is tolerating current regimen with good life quality, partial response.  Shared decision was made to hold off adding additional agents.  INTERVAL HISTORY Jared Gutierrez is a 80 y.o. male who has above history reviewed by me today presents for follow up visit for management of recurrent metastatic colon cancer Problems and complaints  are listed below: he was accompanied by wife Patient tolerated  5-FU and bevacizumab.  Patient has complaints.  Denies any abdominal pain, nausea vomiting diarrhea. He has no new complaints.   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  Stable symptoms.  Not on gabapentin.    Review of Systems  Constitutional:  Negative for appetite change, chills, diaphoresis, fever and unexpected weight change.  HENT:   Negative for hearing loss, nosebleeds, sore throat and tinnitus.   Respiratory:  Positive for shortness of breath. Negative for cough and hemoptysis.   Cardiovascular:  Positive for leg swelling. Negative for chest pain and palpitations.  Gastrointestinal:  Negative for abdominal pain, blood in stool, constipation, diarrhea, nausea and vomiting.  Genitourinary:  Negative for dysuria, frequency and hematuria.   Musculoskeletal:  Positive for arthralgias. Negative for back pain, myalgias and neck pain.  Skin:  Negative for itching and rash.  Neurological:  Negative for dizziness and headaches.  Hematological:  Does not bruise/bleed easily.  Psychiatric/Behavioral:  Negative for depression. The patient is not nervous/anxious.      Past Medical History:  Diagnosis Date   Arthritis    OSTEOARTHRITIS   Cancer ([place])    Cavitary lesion of lung    RIGHT LOWER LOBE   Chicken pox    Colon cancer (HCC)    History of kidney stones    Hypertension    Lipoma of colon    Nephrolithiasis    Nephrolithiasis    Obesity    Shingles    Tubular adenoma of colon  multiple fragments    Past Surgical History:  Procedure Laterality Date   COLON SURGERY     COLONOSCOPY N/A 10/02/2014   Procedure: COLONOSCOPY;  Surgeon: Josefine Class, MD;  Location[facility] ENDOSCOPY;  Service: Endoscopy;  Laterality: N/A;   COLONOSCOPY WITH PROPOFOL N/A 02/16/2017   Procedure: COLONOSCOPY WITH PROPOFOL;  Surgeon: Manya Silvas, MD;  Location: [facility] ENDOSCOPY;  Service:  Endoscopy;  Laterality: N/A;   COLONOSCOPY WITH PROPOFOL N/A 10/05/2020   Procedure: COLONOSCOPY WITH PROPOFOL;  Surgeon: Lesly Rubenstein, MD;  Location: [facility] ENDOSCOPY;  Service: Endoscopy;  Laterality: N/A;   EUS N/A 04/17/2019   Procedure: FULL UPPER ENDOSCOPIC ULTRASOUND (EUS) RADIAL;  Surgeon: Holly Bodily, MD;  Location: [facility] ENDOSCOPY;  Service: Gastroenterology;  Laterality: N/A;   KIDNEY STONE SURGERY     PARTIAL COLECTOMY  10/17/2013   PORTACATH PLACEMENT Right 06/13/2019   Procedure: INSERTION PORT-A-CATH;  Surgeon: Benjamine Sprague, DO;  Location: [facility] ORS;  Service: General;  Laterality: Right;    Family History  Problem Relation Age of Onset   Cancer Mother    Breast cancer Mother    COPD Father     Social History:  reports that he has never smoked. He has never used smokeless tobacco. He reports current alcohol use. He reports that he does not use drugs. 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 He is married and his wife's name is [name] [phone]).    Allergies: No Known Allergies  Current Medications: Current Outpatient Medications  Medication Sig Dispense Refill   acetaminophen (TYLENOL) 500 MG tablet Take 500 mg by mouth every 6 (six) hours as needed.     amLODipine (NORVASC) 2.5 MG tablet Take 2.5 mg by mouth daily.     aspirin EC 81 MG tablet Take 81 mg by mouth daily.     Cholecalciferol (VITAMIN D) 125 MCG (5000 UT) CAPS Take 5,000 mg by mouth.     docusate sodium (COLACE) 100 MG capsule Take 100 mg by mouth 2 (two) times daily.     gabapentin (NEURONTIN) 100 MG capsule Take 1 capsule (100 mg total) by mouth 3 (three) times daily. 90 capsule 0   HYDROcodone-acetaminophen (NORCO/VICODIN) 5-325 MG tablet Take 1 tablet by mouth every 6 (six) hours as needed for moderate pain (up to 3 doses for moderate pain.).     lidocaine-prilocaine (EMLA) cream Apply to affected area once 30 g 3   loratadine (CLARITIN) 10 MG tablet Take 10  mg by mouth daily.     olmesartan (BENICAR) 20 MG tablet Take 1 tablet by mouth daily.     ondansetron (ZOFRAN) 8 MG tablet Take 1 tablet (8 mg total) by mouth 2 (two) times daily as needed for refractory nausea / vomiting. Start on day 3 after chemotherapy. 60 tablet 1   Probiotic Product (PROBIOTIC DAILY PO) Take 1 tablet by mouth daily.     prochlorperazine (COMPAZINE) 10 MG tablet Take 1 tablet (10 mg total) by mouth every 6 (six) hours as needed (NAUSEA). 30 tablet 1   NEOMYCIN-POLYMYXIN-HYDROCORTISONE (CORTISPORIN) 1 % SOLN OTIC solution SMARTSIG:Left Ear (Patient not taking: No sig reported)     potassium chloride SA (KLOR-CON) 20 MEQ tablet Take by mouth. (Patient not taking: No sig reported)     No current facility-administered medications for this visit.   Facility-Administered Medications Ordered in Other Visits  Medication Dose Route Frequency Provider Last Rate Last Admin   0.9 %  sodium chloride infusion   Intravenous Once Corcoran, Melissa C, MD       0.9 %  sodium chloride infusion   Intravenous Continuous Lequita Asal, MD 10 mL/hr at 12/31/19 1000 New Bag at 10/05/20 1045   fluorouracil (ADRUCIL) 5,800 mg in sodium chloride 0.9 % 134 mL chemo infusion  2,400 mg/m2 (Order-Specific) Intravenous 1 day or 1 dose [name], MD       fluorouracil (ADRUCIL) chemo injection 950 mg  400 mg/m2 (Order-Specific) Intravenous Once [name], MD       heparin lock flush 100 unit/mL  500 Units Intravenous Once Nolon Stalls C, MD       leucovorin 950 mg in sodium chloride 0.9 % 250 mL infusion  950 mg Intravenous Once [name], MD       sodium chloride flush (NS) 0.9 % injection 10 mL  10 mL Intravenous PRN [name], MD   10 mL at 03/22/21 0812    Performance status (ECOG): 1  Vitals Blood pressure 123/79, pulse 75, temperature 97.8 F (36.6 C), resp. rate 18, weight 264 lb 8 oz (120 kg).   Physical Exam Vitals and nursing note reviewed.  Constitutional:      General: He is  not in acute distress.    Appearance: He is well-developed. He is obese. He is not diaphoretic.     Interventions: Face mask in place.     Comments: Patient walks with a cane  HENT:     Head: Normocephalic and atraumatic.     Mouth/Throat:     Mouth: Mucous membranes are moist.     Pharynx: Oropharynx is clear.  Eyes:     General: No scleral icterus.    Extraocular Movements: Extraocular movements intact.     Conjunctiva/sclera: Conjunctivae normal.     Pupils: Pupils are equal, round, and reactive to light.  Cardiovascular:     Rate and Rhythm: Normal rate and regular rhythm.     Heart sounds: Normal heart sounds. No murmur heard. Pulmonary:     Effort: Pulmonary effort is normal. No respiratory distress.     Breath sounds: No wheezing or rales.     Comments: Decreased breath sound bilaterally Chest:     Chest wall: No tenderness.  Abdominal:     General: Bowel sounds are normal. There is no distension.     Palpations: Abdomen is soft. There is no mass.     Tenderness: There is no abdominal tenderness. There is no guarding or rebound.  Musculoskeletal:        General: No swelling or tenderness. Normal range of motion.     Cervical back: Normal range of motion and neck supple.     Right lower leg: Edema present.     Left lower leg: Edema present.  Lymphadenopathy:     Head:     Right side of head: No preauricular, posterior auricular or occipital adenopathy.     Left side of head: No preauricular, posterior auricular or occipital adenopathy.     Cervical: No cervical adenopathy.     Upper Body:     Right upper body: No supraclavicular or axillary adenopathy.     Left upper body: No supraclavicular or axillary adenopathy.     Lower Body: No right inguinal adenopathy. No left inguinal adenopathy.  Skin:    General: Skin is warm and dry.  Neurological:     General: No focal deficit present.     Mental Status: He is alert  and oriented to person, place, and time. Mental status  is at baseline.  Psychiatric:        Mood and Affect: Mood normal.   Infusion on 03/22/2021  Component Date Value Ref Range Status   Sodium 03/22/2021 138  135 - 145 mmol/L Final   Potassium 03/22/2021 3.6  3.5 - 5.1 mmol/L Final   Chloride 03/22/2021 104  98 - 111 mmol/L Final   CO2 03/22/2021 26  22 - 32 mmol/L Final   Glucose, Bld 03/22/2021 128 (A)  70 - 99 mg/dL Final   Glucose reference range applies only to samples taken after fasting for at least 8 hours.   BUN 03/22/2021 12  8 - 23 mg/dL Final   Creatinine, Ser 03/22/2021 0.70  0.61 - 1.24 mg/dL Final   Calcium 03/22/2021 9.4  8.9 - 10.3 mg/dL Final   Total Protein 03/22/2021 6.8  6.5 - 8.1 g/dL Final   Albumin 03/22/2021 3.7  3.5 - 5.0 g/dL Final   AST 03/22/2021 47 (A)  15 - 41 U/L Final   ALT 03/22/2021 37  0 - 44 U/L Final   Alkaline Phosphatase 03/22/2021 45  38 - 126 U/L Final   Total Bilirubin 03/22/2021 1.1  0.3 - 1.2 mg/dL Final   GFR, Estimated 03/22/2021 >60  >60 mL/min Final   Comment: (NOTE) Calculated using the CKD-EPI Creatinine Equation (2021)    Anion gap 03/22/2021 8  5 - 15 Final   Performed at [facility], [place], [place] [address]   WBC 03/22/2021 4.8  4.0 - 10.5 K/uL Final   RBC 03/22/2021 4.03 (A)  4.22 - 5.81 MIL/uL Final   Hemoglobin 03/22/2021 13.0  13.0 - 17.0 g/dL Final   HCT 03/22/2021 38.7 (A)  39.0 - 52.0 % Final   MCV 03/22/2021 96.0  80.0 - 100.0 fL Final   MCH 03/22/2021 32.3  26.0 - 34.0 pg Final   MCHC 03/22/2021 33.6  30.0 - 36.0 g/dL Final   RDW 03/22/2021 13.9  11.5 - 15.5 % Final   Platelets 03/22/2021 161  150 - 400 K/uL Final   nRBC 03/22/2021 0.0  0.0 - 0.2 % Final   Neutrophils Relative % 03/22/2021 57  % Final   Neutro Abs 03/22/2021 2.8  1.7 - 7.7 K/uL Final   Lymphocytes Relative 03/22/2021 29  % Final   Lymphs Abs 03/22/2021 1.4  0.7 - 4.0 K/uL Final   Monocytes Relative 03/22/2021 11  % Final   Monocytes Absolute 03/22/2021 0.5  0.1 - 1.0 K/uL  Final   Eosinophils Relative 03/22/2021 2  % Final   Eosinophils Absolute 03/22/2021 0.1  0.0 - 0.5 K/uL Final   Basophils Relative 03/22/2021 1  % Final   Basophils Absolute 03/22/2021 0.0  0.0 - 0.1 K/uL Final   Immature Granulocytes 03/22/2021 0  % Final   Abs Immature Granulocytes 03/22/2021 0.01  0.00 - 0.07 K/uL Final   Performed at [facility], [address] [place], [place] [address]   Total Protein, Urine 03/22/2021 23  mg/dL Final   Comment: NO NORMAL RANGE ESTABLISHED FOR THIS TEST Performed at [facility], [address] [place], [place] [address]     Assessment and plan 1. 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 Encounter for antineoplastic chemotherapy   3. Colon cancer metastasized to multiple sites [facility])    #Metastatic colon cancer-recurrent Labs are reviewed and discussed with patient Proceed with 5-FU and bevacizumab today.  CEA at the last visit slightly increased.  Today CEA pending  # Chemotherapy induced neuropathy Patient has supply of gabapentin and does not want to use due to concern of side effects.  Previously discussed about acupuncture option. Patient prefers to defer that for now and he will let me know if he wants to try acupuncture.   Follow-up 2 weeks lab MD 5-FU/bevacizumab   I discussed the assessment and treatment plan with the patient.  The patient was provided an opportunity to ask questions and all were answered.  The patient agreed with the plan and demonstrated an understanding of the instructions.  The patient was advised to call back if the symptoms worsen or if the condition fails to improve as anticipated.  [name], MD, PhD 03/22/2021

## 2021-03-22 NOTE — Progress Notes (Signed)
0900: Per Roland Rack CMA per Dr. Tasia Catchings okay to proceed with treatment including Zirabev using 03/08/21 urine protein of 24. Today's (03/22/21) urine protein has not resulted at this time.

## 2021-03-22 NOTE — Progress Notes (Signed)
Pt here for follow up. No new concerns voiced.   

## 2021-03-22 NOTE — Patient Instructions (Signed)
[facility]  Discharge Instructions: Thank you for choosing [facility] to provide your oncology and hematology care.  If you have a lab appointment with the [facility], please go directly to the [facility] and check in at the registration area.  Wear comfortable clothing and clothing appropriate for easy access to any Portacath or PICC line.   We strive to give you quality time with your provider. You may need to reschedule your appointment if you arrive late (15 or more minutes).  Arriving late affects you and other patients whose appointments are after yours.  Also, if you miss three or more appointments without notifying the office, you may be dismissed from the clinic at the provider's discretion.      For prescription refill requests, have your pharmacy contact our office and allow 72 hours for refills to be completed.    Today you received the following chemotherapy and/or immunotherapy agents Zirabev, Leucovorin, Adrucil       To help prevent nausea and vomiting after your treatment, we encourage you to take your nausea medication as directed.  BELOW ARE SYMPTOMS THAT SHOULD BE REPORTED IMMEDIATELY: *FEVER GREATER THAN 100.4 F (38 C) OR HIGHER *CHILLS OR SWEATING *NAUSEA AND VOMITING THAT IS NOT CONTROLLED WITH YOUR NAUSEA MEDICATION *UNUSUAL SHORTNESS OF BREATH *UNUSUAL BRUISING OR BLEEDING *URINARY PROBLEMS (pain or burning when urinating, or frequent urination) *BOWEL PROBLEMS (unusual diarrhea, constipation, pain near the anus) TENDERNESS IN MOUTH AND THROAT WITH OR WITHOUT PRESENCE OF ULCERS (sore throat, sores in mouth, or a toothache) UNUSUAL RASH, SWELLING OR PAIN  UNUSUAL VAGINAL DISCHARGE OR ITCHING   Items with * indicate a potential emergency and should be followed up as soon as possible or go to the Emergency Department if any problems should occur.  Please show the CHEMOTHERAPY ALERT CARD or IMMUNOTHERAPY  ALERT CARD at check-in to the Emergency Department and triage nurse.  Should you have questions after your visit or need to cancel or reschedule your appointment, please contact [facility]  [phone] and follow the prompts.  Office hours are 8:00 a.m. to 4:30 p.m. Monday - Friday. Please note that voicemails left after 4:00 p.m. may not be returned until the following business day.  We are closed weekends and major holidays. You have access to a nurse at all times for urgent questions. Please call the main number to the clinic [phone] and follow the prompts.  For any non-urgent questions, you may also contact your provider using MyChart. We now offer e-Visits for anyone 57 and older to request care online for non-urgent symptoms. For details visit [email or web address].   Also download the MyChart app! Go to the app store, search "MyChart", open the app, select [facility], and log in with your MyChart username and password.  Due to Covid, a mask is required upon entering the hospital/clinic. If you do not have a mask, one will be given to you upon arrival. For doctor visits, patients may have 1 support person aged 81 or older with them. For treatment visits, patients cannot have anyone with them due to current Covid guidelines and our immunocompromised population.

## 2021-03-23 LAB — CEA: CEA: 11.3 ng/mL — ABNORMAL HIGH (ref 0.0–4.7)

## 2021-03-24 ENCOUNTER — Other Ambulatory Visit: Payer: Self-pay

## 2021-03-24 ENCOUNTER — Inpatient Hospital Stay: Payer: Medicare Other

## 2021-03-24 VITALS — BP 143/79 | HR 65 | Temp 97.9°F | Resp 20

## 2021-03-24 DIAGNOSIS — Z5112 Encounter for antineoplastic immunotherapy: Secondary | ICD-10-CM | POA: Diagnosis not present

## 2021-03-24 DIAGNOSIS — C786 Secondary malignant neoplasm of retroperitoneum and peritoneum: Secondary | ICD-10-CM

## 2021-03-24 DIAGNOSIS — Z85038 Personal history of other malignant neoplasm of large intestine: Secondary | ICD-10-CM

## 2021-03-24 MED ORDER — HEPARIN SOD (PORK) LOCK FLUSH 100 UNIT/ML IV SOLN
INTRAVENOUS | Status: AC
  Administered 2021-03-24: 500 [IU]
  Filled 2021-03-24: qty 5

## 2021-03-24 MED ORDER — SODIUM CHLORIDE 0.9% FLUSH
10.0000 mL | INTRAVENOUS | Status: DC | PRN
  Administered 2021-03-24: 10 mL
  Filled 2021-03-24: qty 10

## 2021-03-24 MED ORDER — HEPARIN SOD (PORK) LOCK FLUSH 100 UNIT/ML IV SOLN
500.0000 [IU] | Freq: Once | INTRAVENOUS | Status: AC | PRN
  Filled 2021-03-24: qty 5

## 2021-04-01 ENCOUNTER — Other Ambulatory Visit: Payer: Self-pay

## 2021-04-01 ENCOUNTER — Inpatient Hospital Stay (HOSPITAL_BASED_OUTPATIENT_CLINIC_OR_DEPARTMENT_OTHER): Payer: Medicare Other | Admitting: Oncology

## 2021-04-01 ENCOUNTER — Encounter: Payer: Self-pay | Admitting: Oncology

## 2021-04-01 VITALS — BP 141/83 | HR 72 | Temp 97.7°F | Wt 259.0 lb

## 2021-04-01 DIAGNOSIS — Z5111 Encounter for antineoplastic chemotherapy: Secondary | ICD-10-CM | POA: Diagnosis not present

## 2021-04-01 DIAGNOSIS — C184 Malignant neoplasm of transverse colon: Secondary | ICD-10-CM

## 2021-04-01 DIAGNOSIS — C786 Secondary malignant neoplasm of retroperitoneum and peritoneum: Secondary | ICD-10-CM | POA: Diagnosis not present

## 2021-04-01 DIAGNOSIS — G620 Drug-induced polyneuropathy: Secondary | ICD-10-CM

## 2021-04-01 DIAGNOSIS — Z5112 Encounter for antineoplastic immunotherapy: Secondary | ICD-10-CM | POA: Diagnosis not present

## 2021-04-01 DIAGNOSIS — I10 Essential (primary) hypertension: Secondary | ICD-10-CM

## 2021-04-01 DIAGNOSIS — T451X5A Adverse effect of antineoplastic and immunosuppressive drugs, initial encounter: Secondary | ICD-10-CM

## 2021-04-01 NOTE — [place] Notes (Signed)
Patient here for follow up. No concerns voiced at this time.

## 2021-04-01 NOTE — Progress Notes (Signed)
Hematology Oncology Progress Note   Clinic Day: 04/01/2021   Referring physician: Hande, Vishwanath, MD  Chief Complaint: Jared Gutierrez is a 80 y.o. male presents for follow-up of metastatic colon cancer   PERTINENT ONCOLOGY HISTORY Jared Gutierrez is a 80 y.o.[place] who has above oncology history reviewed by me today presented for follow up visit for management of  Metastatic colon cancer. [name], patient switched care to me on 11/11/20 Extensive medical record review was performed by me  # Metastatic colon cancer.  he was diagnosed with stage I colon cancer s/p transverse colectomy on 10/17/2013.  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  Tumor extended into the submucosa. Margins were negative. + lymphovascular invasion. 14 lymph nodes were negative. Pathologic stage was T1 N0.   10/02/2014 Colonoscopy showed several 3 mm polyps and an 8 mm polyp in the cecum and transverse colon.  Pathology revealed tubular adenomas negative for high-grade dysplasia and malignancy.  02/16/2017 Colonoscopy  revealed 2 diminutive polyps in the descending colon.  Pathology revealed tubular adenomas without dysplasia or malignancy.  Her CEA has been monitored and was noted to increase starting July 2020. 04/02/2019  PET scan  limited evealed a 2.4 x 2.3 cm (SUV 11) hypermetabolic soft tissue density caudal and anterior to the pancreatic neck favored to represent isolated peritoneal or nodal metastasis in the setting of prior transverse colonic resection (expected primary drainage). Although this was immediately adjacent to the pancreas, a fat plane was maintained, arguing strongly against a pancreatic primary. Otherwise, there was no evidence of hypermetabolic metastasis.   04/17/2019 EUS on  revealed a normal esophagus, stomach, duodenum, and pancreas.  There was a  2.4 x 2.4 cm irregular mass in the retroperitoneum adjacent to, but not involving the pancreatic neck.  FNA and core needle biopsy were performed.  Pathology revealed adenocarcinoma compatible with a metastatic lesion of colorectal origin.  Tumor cells were positive for CK20 and CDX2 and negative for CK7.   NGS: [employer] on 05/15/2019 revealed + KRASG13D and TP53.  Negative results included BRAF V600E, Her2, NRAS, NTRK, PD-L1 (<1%), and TMB 8.7/Mb (intermediate).  MMR testing from his colon resection on 10/16/2013 was intact with a low probability of MSI-H.  06/25/2019, CEA 17.8. 07/09/2019 - 08/27/2019; 09/17/2019 - 10/15/2019; 11/12/2019 - 12/17/2019 He received 11 cycles of FOLFOX chemotherapy and 1 cycle of 5-FU and leucovorin (12/31/2019).  He received Neulasta after cycle #4 and #5 secondary to progressive leukopenia.  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  He received a truncated course of FOLFOX with cycle #11 secondary to oxaliplatin reaction.  08/27/2019, CEA 6.1 10/29/2019, CEA 7.7 12/31/2019, CEA 10.8.  01/14/2020 PET revealed an interval decrease in size and FDG uptake (2.4 x 2.3 cm with SUV 11 to 2.4 x 1.7 cm with SUV 4.09) associated with the previously referenced soft tissue density caudal and anterior to the pancreatic neck suggesting treatment response. There were no new sites of FDG avid tumor.  Radiation treatment 02/10/2020 - 02/23/2020.   He received a hybrid 3-dimensional course of 3000 cGy in 10 fractions to the residual FDG avid mass   05/27/2020, CEA 11.6 05/27/2020 Abdomen and pelvis CT :stable to minimally increased (2.9 x 1.8 cm compared to 2.4 x 1.7 cm) since 01/14/2020.  There were no new interval findings.    08/31/2020, CEA 30.9. 08/31/2020 Abdomen and pelvis CT revealed increased size of   the index soft tissue lesion inferior and anterior to the pancreatic neck (2.9 x 1.8 cm to 3.5 x 2.9 cm). There were similar prominent retroperitoneal lymph nodes without  adenopathy by size criteria. There was no new or enlarging abdominal or pelvic lymph nodes. There were no new interval findings. There was similar circumferential wall thickening of a nondistended urinary bladder, which likely accentuated wall thickening There was hepatic steatosis and aortic atherosclerosis.  11/03/2020, PET showed recurrent peritoneal metastasis in the upper abdomen adjacent to the pancreas.  The lesion is 3.8 x 2.9 cm with SUV of 11.3. No evidence of metastatic peritoneal disease elsewhere in the abdomen pelvis.  No evidence of distant metastasis.  Other medical problems Chronic lower extremity edema. 12/24/2018, right lower extremity duplex negative for DVT.  Small right Baker's cyst. 08/16/2019, bilateral lower extremity duplex showed no DVT.  08/16/2019 - 08/17/2019 with right lower extremity cellulitis.  He was unable to bear weight.  He was treated with IV fluids, NSAIDs, colchicine, and broad antibiotics (vancomycin and Cefepime).  He was discharged on indomethacin x 5 days and Keflex 500 mg TID x 5 days.  10/05/2020, colonoscopy showed internal hemorrhoids.  Otherwise normal examination. 11/03/2020 PET scan showed recurrent peritoneal metastasis near pancreas. Given that he has no other distant metastasis.  Repeat SBRT may be considered if he does not respond well to systemic chemotherapy.  Discussed with radiation oncology.  11/26/2020 patient was started on 5-FU and bevacizumab. 12/14/2020 CEA 29.5 02/17/2021 CT abdomen pelvis showed partial response. Mild decrease of peritoneal lesion.  Proceed with 5-FU and bevacizumab today.  Given that he is tolerating current regimen with good life quality, partial response.  Shared decision was made to hold off adding additional agents.  INTERVAL HISTORY Jared Gutierrez is a 80 y.o. male who has above history reviewed by me today presents for follow up visit for management of recurrent metastatic colon cancer Problems and complaints  are listed below: he was accompanied by wife Patient tolerated  5-FU and bevacizumab.  Blood pressure was high in the clinic with systolic blood pressure 180s.  Patient is on Norvasc 2.5 mg and olmesartan.  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  Stable symptoms.  Not on gabapentin.    Review of Systems  Constitutional:  Negative for appetite change, chills, diaphoresis, fever and unexpected weight change.  HENT:   Negative for hearing loss, nosebleeds, sore throat and tinnitus.   Respiratory:  Positive for shortness of breath. Negative for cough and hemoptysis.   Cardiovascular:  Positive for leg swelling. Negative for chest pain and palpitations.  Gastrointestinal:  Negative for abdominal pain, blood in stool, constipation, diarrhea, nausea and vomiting.  Genitourinary:  Negative for dysuria, frequency and hematuria.   Musculoskeletal:  Positive for arthralgias. Negative for back pain, myalgias and neck pain.  Skin:  Negative for itching and rash.  Neurological:  Negative for dizziness and headaches.  Hematological:  Does not bruise/bleed easily.  Psychiatric/Behavioral:  Negative for depression. The patient is not nervous/anxious.      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   Chicken pox    Colon cancer (HCC)    History of kidney stones    Hypertension    Lipoma of colon    Nephrolithiasis    Nephrolithiasis    Obesity    Shingles      Tubular adenoma of colon    multiple fragments    Past Surgical History:  Procedure Laterality Date   COLON SURGERY     COLONOSCOPY N/A 10/02/2014   Procedure: COLONOSCOPY;  Surgeon: Matthew Gordon Rein, MD;  Location: [facility];  Service: Endoscopy;  Laterality: N/A;   COLONOSCOPY WITH PROPOFOL N/A 02/16/2017   Procedure: COLONOSCOPY WITH PROPOFOL;  Surgeon: Elliott, Robert T, MD;  Location: [facility];   Service: Endoscopy;  Laterality: N/A;   COLONOSCOPY WITH PROPOFOL N/A 10/05/2020   Procedure: COLONOSCOPY WITH PROPOFOL;  Surgeon: Locklear, Cameron T, MD;  Location: [facility];  Service: Endoscopy;  Laterality: N/A;   EUS N/A 04/17/2019   Procedure: FULL UPPER ENDOSCOPIC ULTRASOUND (EUS) RADIAL;  Surgeon: Burbridge, Rebecca A, MD;  Location: [facility];  Service: Gastroenterology;  Laterality: N/A;   KIDNEY STONE SURGERY     PARTIAL COLECTOMY  10/17/2013   PORTACATH PLACEMENT Right 06/13/2019   Procedure: INSERTION PORT-A-CATH;  Surgeon: Sakai, Isami, DO;  Location: [facility];  Service: General;  Laterality: Right;    Family History  Problem Relation Age of Onset   Cancer Mother    Breast cancer Mother    COPD Father     Social History:  reports that he has never smoked. He has never used smokeless tobacco. He reports current alcohol use. He reports that he does not use drugs. 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 He is married and his wife's name is Linda ([phone]).    Allergies: No Known Allergies  Current Medications: Current Outpatient Medications  Medication Sig Dispense Refill   acetaminophen (TYLENOL) 500 MG tablet Take 500 mg by mouth every 6 (six) hours as needed.     amLODipine (NORVASC) 2.5 MG tablet Take 2.5 mg by mouth daily.     aspirin EC 81 MG tablet Take 81 mg by mouth daily.     Cholecalciferol (VITAMIN D) 125 MCG (5000 UT) CAPS Take 5,000 mg by mouth.     docusate sodium (COLACE) 100 MG capsule Take 100 mg by mouth 2 (two) times daily.     gabapentin (NEURONTIN) 100 MG capsule Take 1 capsule (100 mg total) by mouth 3 (three) times daily. 90 capsule 0   HYDROcodone-acetaminophen (NORCO/VICODIN) 5-325 MG tablet Take 1 tablet by mouth every 6 (six) hours as needed for moderate pain (up to 3 doses for moderate pain.).     lidocaine-prilocaine (EMLA) cream Apply to affected area once 30 g 3   loratadine (CLARITIN) 10 MG tablet  Take 10 mg by mouth daily.     olmesartan (BENICAR) 20 MG tablet Take 1 tablet by mouth daily.     ondansetron (ZOFRAN) 8 MG tablet Take 1 tablet (8 mg total) by mouth 2 (two) times daily as needed for refractory nausea / vomiting. Start on day 3 after chemotherapy. 60 tablet 1   Probiotic Product (PROBIOTIC DAILY PO) Take 1 tablet by mouth daily.     prochlorperazine (COMPAZINE) 10 MG tablet Take 1 tablet (10 mg total) by mouth every 6 (six) hours as needed (NAUSEA). 30 tablet 1   NEOMYCIN-POLYMYXIN-HYDROCORTISONE (CORTISPORIN) 1 % SOLN OTIC solution SMARTSIG:Left Ear (Patient not taking: Reported on 11/11/2020)     potassium chloride SA (KLOR-CON) 20 MEQ tablet Take by mouth. (Patient not taking: Reported on 01/11/2021)     No current facility-administered medications for this visit.   Facility-Administered Medications Ordered in Other Visits  Medication Dose Route Frequency Provider Last   Rate Last Admin   0.9 %  sodium chloride infusion   Intravenous Once Corcoran, Melissa C, MD       0.9 %  sodium chloride infusion   Intravenous Continuous Corcoran, Melissa C, MD 10 mL/hr at 12/31/19 1000 New Bag at 10/05/20 1045   heparin lock flush 100 unit/mL  500 Units Intravenous Once Corcoran, Melissa C, MD        Performance status (ECOG): 1  Vitals Blood pressure (!) 141/83, pulse 72, temperature 97.7 F (36.5 C), temperature source Tympanic, weight 259 lb (117.5 kg).   Physical Exam Vitals and nursing note reviewed.  Constitutional:      General: He is not in acute distress.    Appearance: He is well-developed. He is obese. He is not diaphoretic.     Interventions: Face mask in place.     Comments: Patient walks with a cane  HENT:     Head: Normocephalic and atraumatic.     Mouth/Throat:     Mouth: Mucous membranes are moist.     Pharynx: Oropharynx is clear.  Eyes:     General: No scleral icterus.    Extraocular Movements: Extraocular movements intact.     Conjunctiva/sclera:  Conjunctivae normal.     Pupils: Pupils are equal, round, and reactive to light.  Cardiovascular:     Rate and Rhythm: Normal rate and regular rhythm.     Heart sounds: Normal heart sounds. No murmur heard. Pulmonary:     Effort: Pulmonary effort is normal. No respiratory distress.     Breath sounds: No wheezing or rales.     Comments: Decreased breath sound bilaterally Chest:     Chest wall: No tenderness.  Abdominal:     General: Bowel sounds are normal. There is no distension.     Palpations: Abdomen is soft. There is no mass.     Tenderness: There is no abdominal tenderness. There is no guarding or rebound.  Musculoskeletal:        General: No swelling or tenderness. Normal range of motion.     Cervical back: Normal range of motion and neck supple.     Right lower leg: Edema present.     Left lower leg: Edema present.  Lymphadenopathy:     Head:     Right side of head: No preauricular, posterior auricular or occipital adenopathy.     Left side of head: No preauricular, posterior auricular or occipital adenopathy.     Cervical: No cervical adenopathy.     Upper Body:     Right upper body: No supraclavicular or axillary adenopathy.     Left upper body: No supraclavicular or axillary adenopathy.     Lower Body: No right inguinal adenopathy. No left inguinal adenopathy.  Skin:    General: Skin is warm and dry.  Neurological:     General: No focal deficit present.     Mental Status: He is alert and oriented to person, place, and time. Mental status is at baseline.  Psychiatric:        Mood and Affect: Mood normal.   No visits with results within 3 Day(s) from this visit.  Latest known visit with results is:  Infusion on 03/22/2021  Component Date Value Ref Range Status   Sodium 03/22/2021 138  135 - 145 mmol/L Final   Potassium 03/22/2021 3.6  3.5 - 5.1 mmol/L Final   Chloride 03/22/2021 104  98 - 111 mmol/L Final   CO2 03/22/2021 26  22 -   32 mmol/L Final   Glucose, Bld  03/22/2021 128 (H)  70 - 99 mg/dL Final   Glucose reference range applies only to samples taken after fasting for at least 8 hours.   BUN 03/22/2021 12  8 - 23 mg/dL Final   Creatinine, Ser 03/22/2021 0.70  0.61 - 1.24 mg/dL Final   Calcium 03/22/2021 9.4  8.9 - 10.3 mg/dL Final   Total Protein 03/22/2021 6.8  6.5 - 8.1 g/dL Final   Albumin 03/22/2021 3.7  3.5 - 5.0 g/dL Final   AST 03/22/2021 47 (H)  15 - 41 U/L Final   ALT 03/22/2021 37  0 - 44 U/L Final   Alkaline Phosphatase 03/22/2021 45  38 - 126 U/L Final   Total Bilirubin 03/22/2021 1.1  0.3 - 1.2 mg/dL Final   GFR, Estimated 03/22/2021 >60  >60 mL/min Final   Comment: (NOTE) Calculated using the CKD-EPI Creatinine Equation (2021)    Anion gap 03/22/2021 8  5 - 15 Final   Performed at [facility], [address] [place], [place] [address]   WBC 03/22/2021 4.8  4.0 - 10.5 K/uL Final   RBC 03/22/2021 4.03 (L)  4.22 - 5.81 MIL/uL Final   Hemoglobin 03/22/2021 13.0  13.0 - 17.0 g/dL Final   HCT 03/22/2021 38.7 (L)  39.0 - 52.0 % Final   MCV 03/22/2021 96.0  80.0 - 100.0 fL Final   MCH 03/22/2021 32.3  26.0 - 34.0 pg Final   MCHC 03/22/2021 33.6  30.0 - 36.0 g/dL Final   RDW 03/22/2021 13.9  11.5 - 15.5 % Final   Platelets 03/22/2021 161  150 - 400 K/uL Final   nRBC 03/22/2021 0.0  0.0 - 0.2 % Final   Neutrophils Relative % 03/22/2021 57  % Final   Neutro Abs 03/22/2021 2.8  1.7 - 7.7 K/uL Final   Lymphocytes Relative 03/22/2021 29  % Final   Lymphs Abs 03/22/2021 1.4  0.7 - 4.0 K/uL Final   Monocytes Relative 03/22/2021 11  % Final   Monocytes Absolute 03/22/2021 0.5  0.1 - 1.0 K/uL Final   Eosinophils Relative 03/22/2021 2  % Final   Eosinophils Absolute 03/22/2021 0.1  0.0 - 0.5 K/uL Final   Basophils Relative 03/22/2021 1  % Final   Basophils Absolute 03/22/2021 0.0  0.0 - 0.1 K/uL Final   Immature Granulocytes 03/22/2021 0  % Final   Abs Immature Granulocytes 03/22/2021 0.01  0.00 - 0.07 K/uL Final   Performed  at [facility], [address] [place], [place] [address]   Total Protein, Urine 03/22/2021 23  mg/dL Final   Comment: NO NORMAL RANGE ESTABLISHED FOR THIS TEST Performed at  [facility], [address] [place], [place] [address]    CEA 03/22/2021 11.3 (H)  0.0 - 4.7 ng/mL Final   Comment: (NOTE)                             Nonsmokers          <3.9                             Smokers             <5.6 [employer] Electrochemiluminescence Immunoassay (ECLIA) Values obtained with different assay methods or kits cannot be used interchangeably.  Results cannot be interpreted as absolute evidence of the presence or absence   of malignant disease. Performed At: [facility] [place], [place] [id number] Rush Farmer MD QI:[id number]     Assessment and plan 1. Encounter for antineoplastic chemotherapy   2. Cancer of transverse colon ([place])   3. 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 Chemotherapy-induced neuropathy ([place])   5. Primary hypertension    #Metastatic colon cancer-recurrent Overall he tolerates chemotherapy.  CEA continues to trend down, 03/22/2021 11.3. 04/05/2021, patient will have labs done and if within parameters, he will proceed with 5-FU and bevacizumab. Day 4 pump discontinuation on 04/08/2021.  # Chemotherapy induced neuropathy Patient has supply of gabapentin and does not want to use due to concern of side effects.  Previously discussed about acupuncture option. Patient prefers to defer that for now and he will let me know if he wants to try acupuncture.   #Hypertension, systolic blood pressure was high today at 180.  Repeat blood pressure was 141/83. I discussed with patient and wife that increased blood pressure could be a side effect from bevacizumab. I recommend patient to monitor blood pressure daily.  If SBP is persistently above 150, I recommend patient to increase Norvasc to 5 mg daily.  If SBP is less than  150, continue his current BP regimen with Norvasc 2.5 mg and Omelsartan.  Patient and wife voiced understanding.   Follow-up 2 weeks lab MD 5-FU/bevacizumab - 04/19/2021  I discussed the assessment and treatment plan with the patient.  The patient was provided an opportunity to ask questions and all were answered.  The patient agreed with the plan and demonstrated an understanding of the instructions.  The patient was advised to call back if the symptoms worsen or if the condition fails to improve as anticipated.  [name], MD, PhD 04/01/2021

## 2021-04-05 ENCOUNTER — Inpatient Hospital Stay: Payer: Medicare Other

## 2021-04-05 ENCOUNTER — Other Ambulatory Visit: Payer: Self-pay

## 2021-04-05 VITALS — BP 158/69 | HR 60 | Temp 97.7°F | Resp 19 | Wt 268.0 lb

## 2021-04-05 DIAGNOSIS — C189 Malignant neoplasm of colon, unspecified: Secondary | ICD-10-CM

## 2021-04-05 DIAGNOSIS — Z85038 Personal history of other malignant neoplasm of large intestine: Secondary | ICD-10-CM

## 2021-04-05 DIAGNOSIS — Z5112 Encounter for antineoplastic immunotherapy: Secondary | ICD-10-CM | POA: Diagnosis not present

## 2021-04-05 DIAGNOSIS — C786 Secondary malignant neoplasm of retroperitoneum and peritoneum: Secondary | ICD-10-CM

## 2021-04-05 LAB — PROTEIN, URINE, RANDOM: Total Protein, Urine: 21 mg/dL

## 2021-04-05 LAB — COMPREHENSIVE METABOLIC PANEL
ALT: 34 U/L (ref 0–44)
AST: 46 U/L — ABNORMAL HIGH (ref 15–41)
Albumin: 3.4 g/dL — ABNORMAL LOW (ref 3.5–5.0)
Alkaline Phosphatase: 44 U/L (ref 38–126)
Anion gap: 6 (ref 5–15)
BUN: 9 mg/dL (ref 8–23)
CO2: 25 mmol/L (ref 22–32)
Calcium: 8.9 mg/dL (ref 8.9–10.3)
Chloride: 105 mmol/L (ref 98–111)
Creatinine, Ser: 0.73 mg/dL (ref 0.61–1.24)
GFR, Estimated: >60 mL/min (ref >60)
Glucose, Bld: 152 mg/dL — ABNORMAL HIGH (ref 70–99)
Potassium: 3.5 mmol/L (ref 3.5–5.1)
Sodium: 136 mmol/L (ref 135–145)
Total Bilirubin: 1.1 mg/dL (ref 0.3–1.2)
Total Protein: 6.6 g/dL (ref 6.5–8.1)

## 2021-04-05 LAB — CBC WITH DIFFERENTIAL/PLATELET
Abs Immature Granulocytes: 0.01 10*3/uL (ref 0.00–0.07)
Basophils Absolute: 0 10*3/uL (ref 0.0–0.1)
Basophils Relative: 0 %
Eosinophils Absolute: 0.1 10*3/uL (ref 0.0–0.5)
Eosinophils Relative: 3 %
HCT: 36.3 % — ABNORMAL LOW (ref 39.0–52.0)
Hemoglobin: 12.1 g/dL — ABNORMAL LOW (ref 13.0–17.0)
Immature Granulocytes: 0 %
Lymphocytes Relative: 28 %
Lymphs Abs: 1.2 10*3/uL (ref 0.7–4.0)
MCH: 31.6 pg (ref 26.0–34.0)
MCHC: 33.3 g/dL (ref 30.0–36.0)
MCV: 94.8 fL (ref 80.0–100.0)
Monocytes Absolute: 0.4 10*3/uL (ref 0.1–1.0)
Monocytes Relative: 9 %
Neutro Abs: 2.7 10*3/uL (ref 1.7–7.7)
Neutrophils Relative %: 60 %
Platelets: 161 10*3/uL (ref 150–400)
RBC: 3.83 MIL/uL — ABNORMAL LOW (ref 4.22–5.81)
RDW: 13.9 % (ref 11.5–15.5)
WBC: 4.5 10*3/uL (ref 4.0–10.5)
nRBC: 0 % (ref 0.0–0.2)

## 2021-04-05 MED ORDER — SODIUM CHLORIDE 0.9 % IV SOLN
Freq: Once | INTRAVENOUS | Status: AC
  Filled 2021-04-05: qty 250

## 2021-04-05 MED ORDER — SODIUM CHLORIDE 0.9 % IV SOLN
950.0000 mg | Freq: Once | INTRAVENOUS | Status: AC
  Administered 2021-04-05: 950 mg via INTRAVENOUS
  Filled 2021-04-05: qty 47.5

## 2021-04-05 MED ORDER — SODIUM CHLORIDE 0.9% FLUSH
10.0000 mL | Freq: Once | INTRAVENOUS | Status: AC
  Administered 2021-04-05: 10 mL via INTRAVENOUS
  Filled 2021-04-05: qty 10

## 2021-04-05 MED ORDER — SODIUM CHLORIDE 0.9 % IV SOLN
5800.0000 mg | INTRAVENOUS | Status: DC
  Administered 2021-04-05: 5800 mg via INTRAVENOUS
  Filled 2021-04-05: qty 116

## 2021-04-05 MED ORDER — FLUOROURACIL CHEMO INJECTION 2.5 GM/50ML
400.0000 mg/m2 | Freq: Once | INTRAVENOUS | Status: AC
  Administered 2021-04-05: 950 mg via INTRAVENOUS
  Filled 2021-04-05: qty 19

## 2021-04-05 MED ORDER — SODIUM CHLORIDE 0.9 % IV SOLN
10.0000 mg | Freq: Once | INTRAVENOUS | Status: AC
  Administered 2021-04-05: 10 mg via INTRAVENOUS
  Filled 2021-04-05: qty 10

## 2021-04-05 MED ORDER — SODIUM CHLORIDE 0.9 % IV SOLN
5.0000 mg/kg | Freq: Once | INTRAVENOUS | Status: AC
  Administered 2021-04-05: 600 mg via INTRAVENOUS
  Filled 2021-04-05: qty 16

## 2021-04-05 NOTE — Patient Instructions (Signed)
[facility]  Discharge Instructions: Thank you for choosing [facility] to provide your oncology and hematology care.  If you have a lab appointment with the [facility], please go directly to the [facility] and check in at the registration area.  Wear comfortable clothing and clothing appropriate for easy access to any Portacath or PICC line.   We strive to give you quality time with your provider. You may need to reschedule your appointment if you arrive late (15 or more minutes).  Arriving late affects you and other patients whose appointments are after yours.  Also, if you miss three or more appointments without notifying the office, you may be dismissed from the clinic at the provider's discretion.      For prescription refill requests, have your pharmacy contact our office and allow 72 hours for refills to be completed.    Today you received the following chemotherapy and/or immunotherapy agents Adrucil, leucovorin, & Zirabev   To help prevent nausea and vomiting after your treatment, we encourage you to take your nausea medication as directed.  BELOW ARE SYMPTOMS THAT SHOULD BE REPORTED IMMEDIATELY: *FEVER GREATER THAN 100.4 F (38 C) OR HIGHER *CHILLS OR SWEATING *NAUSEA AND VOMITING THAT IS NOT CONTROLLED WITH YOUR NAUSEA MEDICATION *UNUSUAL SHORTNESS OF BREATH *UNUSUAL BRUISING OR BLEEDING *URINARY PROBLEMS (pain or burning when urinating, or frequent urination) *BOWEL PROBLEMS (unusual diarrhea, constipation, pain near the anus) TENDERNESS IN MOUTH AND THROAT WITH OR WITHOUT PRESENCE OF ULCERS (sore throat, sores in mouth, or a toothache) UNUSUAL RASH, SWELLING OR PAIN  UNUSUAL VAGINAL DISCHARGE OR ITCHING   Items with * indicate a potential emergency and should be followed up as soon as possible or go to the Emergency Department if any problems should occur.  Please show the CHEMOTHERAPY ALERT CARD or IMMUNOTHERAPY ALERT  CARD at check-in to the Emergency Department and triage nurse.  Should you have questions after your visit or need to cancel or reschedule your appointment, please contact [facility]  [phone] and follow the prompts.  Office hours are 8:00 a.m. to 4:30 p.m. Monday - Friday. Please note that voicemails left after 4:00 p.m. may not be returned until the following business day.  We are closed weekends and major holidays. You have access to a nurse at all times for urgent questions. Please call the main number to the clinic [phone] and follow the prompts.  For any non-urgent questions, you may also contact your provider using MyChart. We now offer e-Visits for anyone 21 and older to request care online for non-urgent symptoms. For details visit [email or web address].   Also download the MyChart app! Go to the app store, search "MyChart", open the app, select [facility], and log in with your MyChart username and password.  Due to Covid, a mask is required upon entering the hospital/clinic. If you do not have a mask, one will be given to you upon arrival. For doctor visits, patients may have 1 support person aged 7 or older with them. For treatment visits, patients cannot have anyone with them due to current Covid guidelines and our immunocompromised population.

## 2021-04-06 LAB — CEA: CEA: 10.4 ng/mL — ABNORMAL HIGH (ref 0.0–4.7)

## 2021-04-08 ENCOUNTER — Inpatient Hospital Stay: Payer: Medicare Other

## 2021-04-08 DIAGNOSIS — C786 Secondary malignant neoplasm of retroperitoneum and peritoneum: Secondary | ICD-10-CM

## 2021-04-08 DIAGNOSIS — Z85038 Personal history of other malignant neoplasm of large intestine: Secondary | ICD-10-CM

## 2021-04-08 DIAGNOSIS — Z5112 Encounter for antineoplastic immunotherapy: Secondary | ICD-10-CM | POA: Diagnosis not present

## 2021-04-08 MED ORDER — HEPARIN SOD (PORK) LOCK FLUSH 100 UNIT/ML IV SOLN
500.0000 [IU] | Freq: Once | INTRAVENOUS | Status: AC | PRN
  Administered 2021-04-08: 500 [IU]
  Filled 2021-04-08: qty 5

## 2021-04-08 MED ORDER — SODIUM CHLORIDE 0.9% FLUSH
10.0000 mL | INTRAVENOUS | Status: DC | PRN
  Administered 2021-04-08: 10 mL
  Filled 2021-04-08: qty 10

## 2021-04-12 ENCOUNTER — Ambulatory Visit: Payer: Medicare Other

## 2021-04-12 ENCOUNTER — Other Ambulatory Visit: Payer: Medicare Other

## 2021-04-12 ENCOUNTER — Ambulatory Visit: Payer: Medicare Other | Admitting: Oncology

## 2021-04-19 ENCOUNTER — Other Ambulatory Visit: Payer: Self-pay

## 2021-04-19 ENCOUNTER — Inpatient Hospital Stay: Payer: Medicare Other

## 2021-04-19 ENCOUNTER — Encounter: Payer: Self-pay | Admitting: Oncology

## 2021-04-19 ENCOUNTER — Inpatient Hospital Stay (HOSPITAL_BASED_OUTPATIENT_CLINIC_OR_DEPARTMENT_OTHER): Payer: Medicare Other | Admitting: Oncology

## 2021-04-19 ENCOUNTER — Inpatient Hospital Stay: Payer: Medicare Other | Attending: Oncology

## 2021-04-19 VITALS — Resp 18

## 2021-04-19 VITALS — BP 115/70 | HR 79 | Temp 97.5°F | Wt 264.0 lb

## 2021-04-19 DIAGNOSIS — D6481 Anemia due to antineoplastic chemotherapy: Secondary | ICD-10-CM

## 2021-04-19 DIAGNOSIS — C189 Malignant neoplasm of colon, unspecified: Secondary | ICD-10-CM

## 2021-04-19 DIAGNOSIS — Z5112 Encounter for antineoplastic immunotherapy: Secondary | ICD-10-CM | POA: Insufficient documentation

## 2021-04-19 DIAGNOSIS — R739 Hyperglycemia, unspecified: Secondary | ICD-10-CM

## 2021-04-19 DIAGNOSIS — C184 Malignant neoplasm of transverse colon: Secondary | ICD-10-CM

## 2021-04-19 DIAGNOSIS — Z836 Family history of other diseases of the respiratory system: Secondary | ICD-10-CM | POA: Insufficient documentation

## 2021-04-19 DIAGNOSIS — Z79899 Other long term (current) drug therapy: Secondary | ICD-10-CM | POA: Diagnosis not present

## 2021-04-19 DIAGNOSIS — L600 Ingrowing nail: Secondary | ICD-10-CM | POA: Diagnosis not present

## 2021-04-19 DIAGNOSIS — Z85038 Personal history of other malignant neoplasm of large intestine: Secondary | ICD-10-CM

## 2021-04-19 DIAGNOSIS — Z86010 Personal history of colonic polyps: Secondary | ICD-10-CM | POA: Insufficient documentation

## 2021-04-19 DIAGNOSIS — T451X5A Adverse effect of antineoplastic and immunosuppressive drugs, initial encounter: Secondary | ICD-10-CM

## 2021-04-19 DIAGNOSIS — Z803 Family history of malignant neoplasm of breast: Secondary | ICD-10-CM | POA: Diagnosis not present

## 2021-04-19 DIAGNOSIS — T451X5D Adverse effect of antineoplastic and immunosuppressive drugs, subsequent encounter: Secondary | ICD-10-CM | POA: Diagnosis not present

## 2021-04-19 DIAGNOSIS — Z5111 Encounter for antineoplastic chemotherapy: Secondary | ICD-10-CM | POA: Insufficient documentation

## 2021-04-19 DIAGNOSIS — R634 Abnormal weight loss: Secondary | ICD-10-CM | POA: Diagnosis not present

## 2021-04-19 DIAGNOSIS — G620 Drug-induced polyneuropathy: Secondary | ICD-10-CM | POA: Insufficient documentation

## 2021-04-19 DIAGNOSIS — C786 Secondary malignant neoplasm of retroperitoneum and peritoneum: Secondary | ICD-10-CM

## 2021-04-19 DIAGNOSIS — I10 Essential (primary) hypertension: Secondary | ICD-10-CM | POA: Insufficient documentation

## 2021-04-19 DIAGNOSIS — Z809 Family history of malignant neoplasm, unspecified: Secondary | ICD-10-CM | POA: Diagnosis not present

## 2021-04-19 LAB — CBC WITH DIFFERENTIAL/PLATELET
Abs Immature Granulocytes: 0.02 10*3/uL (ref 0.00–0.07)
Basophils Absolute: 0 10*3/uL (ref 0.0–0.1)
Basophils Relative: 1 %
Eosinophils Absolute: 0.1 10*3/uL (ref 0.0–0.5)
Eosinophils Relative: 3 %
HCT: 37.2 % — ABNORMAL LOW (ref 39.0–52.0)
Hemoglobin: 12.4 g/dL — ABNORMAL LOW (ref 13.0–17.0)
Immature Granulocytes: 0 %
Lymphocytes Relative: 28 %
Lymphs Abs: 1.3 10*3/uL (ref 0.7–4.0)
MCH: 31.8 pg (ref 26.0–34.0)
MCHC: 33.3 g/dL (ref 30.0–36.0)
MCV: 95.4 fL (ref 80.0–100.0)
Monocytes Absolute: 0.4 10*3/uL (ref 0.1–1.0)
Monocytes Relative: 8 %
Neutro Abs: 2.7 10*3/uL (ref 1.7–7.7)
Neutrophils Relative %: 60 %
Platelets: 170 10*3/uL (ref 150–400)
RBC: 3.9 MIL/uL — ABNORMAL LOW (ref 4.22–5.81)
RDW: 14.2 % (ref 11.5–15.5)
WBC: 4.5 10*3/uL (ref 4.0–10.5)
nRBC: 0 % (ref 0.0–0.2)

## 2021-04-19 LAB — HEMOGLOBIN A1C
Hgb A1c MFr Bld: 5.3 % (ref 4.8–5.6)
Mean Plasma Glucose: 105 mg/dL

## 2021-04-19 LAB — COMPREHENSIVE METABOLIC PANEL
ALT: 50 U/L — ABNORMAL HIGH (ref 0–44)
AST: 68 U/L — ABNORMAL HIGH (ref 15–41)
Albumin: 3.3 g/dL — ABNORMAL LOW (ref 3.5–5.0)
Alkaline Phosphatase: 44 U/L (ref 38–126)
Anion gap: 10 (ref 5–15)
BUN: 11 mg/dL (ref 8–23)
CO2: 24 mmol/L (ref 22–32)
Calcium: 9.2 mg/dL (ref 8.9–10.3)
Chloride: 103 mmol/L (ref 98–111)
Creatinine, Ser: 0.69 mg/dL (ref 0.61–1.24)
GFR, Estimated: >60 mL/min (ref >60)
Glucose, Bld: 206 mg/dL — ABNORMAL HIGH (ref 70–99)
Potassium: 3.4 mmol/L — ABNORMAL LOW (ref 3.5–5.1)
Sodium: 137 mmol/L (ref 135–145)
Total Bilirubin: 1.1 mg/dL (ref 0.3–1.2)
Total Protein: 6.5 g/dL (ref 6.5–8.1)

## 2021-04-19 LAB — PROTEIN, URINE, RANDOM: Total Protein, Urine: 20 mg/dL

## 2021-04-19 IMAGING — PT NM PET TUM IMG RESTAG (PS) SKULL BASE T - THIGH
1 of 9 series · 1 of 25 positions shown · non-contrast
Comparison: CT chest, abdomen and pelvis 12/05/2019 and PET-CT
04/01/2020.

CLINICAL DATA: Subsequent treatment strategy for metastatic colon
cancer.

EXAM:
NUCLEAR MEDICINE PET SKULL BASE TO THIGH
TECHNIQUE: 13.37 mCi F-18 FDG was injected intravenously. Full-ring PET imaging
was performed from the skull base to thigh after the radiotracer. CT
data was obtained and used for attenuation correction and anatomic
localization.
Fasting blood glucose: 83 mg/dl

[Series 3: ct wb 5.0 b30f · axial · 5.0mm · 0.98mm/px · 1 of 329 slices shown]
[im 329/329  brain]
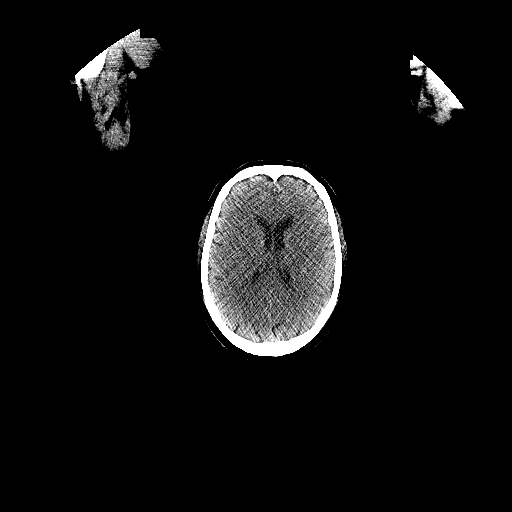

[1 of 25 positions shown; findings below may reference images not displayed]

FINDINGS: Mediastinal blood pool activity: SUV max

Liver activity: SUV max NA

NECK: No hypermetabolic lymph nodes in the neck.

Incidental CT findings: None

CHEST:

No FDG avid supraclavicular, axillary, mediastinal, or hilar lymph
nodes. No FDG avid pulmonary nodules

Incidental CT findings: Emphysema. Bilateral lower lung zone
bronchiectasis identified. Aortic atherosclerosis. Coronary artery
calcifications noted

ABDOMEN/PELVIS: No FDG avid liver lesions. No abnormal uptake
identified within the pancreas, spleen, or adrenal glands.

Index soft tissue density lesion positioned just inferior and
anterior to the pancreatic neck is again seen measuring 2.4 by
cm with SUV max of 4.09. Previously this measured 2.4 x 2.3 cm and
had an SUV max of 11. No hypermetabolic retroperitoneal lymph nodes.
No FDG avid pelvic or inguinal lymph nodes.

Incidental CT findings: Mild aortic atherosclerosis. Lower pole
right kidney cyst. Left lumbar hernia/laxity in the setting of sub
adjacent left renal surgical changes. Postsurgical changes within
the transverse colon again noted.

SKELETON: No focal hypermetabolic activity to suggest skeletal
metastasis.

Incidental CT findings: Lumbar degenerative disc disease.
IMPRESSION: 1. Interval decrease in size and FDG uptake associated with the
previously referenced soft tissue density caudal and anterior to the
pancreatic neck suggesting treatment response.
2. No new sites of FDG avid tumor.
3. Aortic Atherosclerosis ([id number]-[id number].K) and Emphysema ([id number]).
Coronary artery calcifications.

## 2021-04-19 MED ORDER — SODIUM CHLORIDE 0.9% FLUSH
10.0000 mL | INTRAVENOUS | Status: DC | PRN
  Administered 2021-04-19: 10 mL via INTRAVENOUS
  Filled 2021-04-19: qty 10

## 2021-04-19 MED ORDER — SODIUM CHLORIDE 0.9 % IV SOLN
10.0000 mg | Freq: Once | INTRAVENOUS | Status: AC
  Administered 2021-04-19: 10 mg via INTRAVENOUS
  Filled 2021-04-19: qty 10

## 2021-04-19 MED ORDER — SODIUM CHLORIDE 0.9 % IV SOLN
2400.0000 mg/m2 | INTRAVENOUS | Status: DC
  Filled 2021-04-19: qty 114

## 2021-04-19 MED ORDER — FLUOROURACIL CHEMO INJECTION 2.5 GM/50ML
400.0000 mg/m2 | Freq: Once | INTRAVENOUS | Status: AC
  Administered 2021-04-19: 950 mg via INTRAVENOUS
  Filled 2021-04-19: qty 19

## 2021-04-19 MED ORDER — SODIUM CHLORIDE 0.9 % IV SOLN
950.0000 mg | Freq: Once | INTRAVENOUS | Status: AC
  Administered 2021-04-19: 950 mg via INTRAVENOUS
  Filled 2021-04-19: qty 47.5

## 2021-04-19 MED ORDER — SODIUM CHLORIDE 0.9 % IV SOLN
Freq: Once | INTRAVENOUS | Status: AC
  Filled 2021-04-19: qty 250

## 2021-04-19 MED ORDER — SODIUM CHLORIDE 0.9 % IV SOLN
5.0000 mg/kg | Freq: Once | INTRAVENOUS | Status: AC
  Administered 2021-04-19: 600 mg via INTRAVENOUS
  Filled 2021-04-19: qty 16

## 2021-04-19 MED ORDER — SODIUM CHLORIDE 0.9 % IV SOLN
2400.0000 mg/m2 | INTRAVENOUS | Status: DC
  Administered 2021-04-19: 5700 mg via INTRAVENOUS
  Filled 2021-04-19: qty 114

## 2021-04-19 NOTE — Progress Notes (Signed)
Hematology Oncology Progress Note   Clinic Day: 04/19/2021   Referring physician: Tracie Harrier, MD  Chief Complaint: Jared Gutierrez is a 80 y.o. male presents for follow-up of metastatic colon cancer   PERTINENT ONCOLOGY HISTORY Jared Gutierrez is a 80 y.o.[place] who has above oncology history reviewed by me today presented for follow up visit for management of  Metastatic colon cancer. [name], patient switched care to me on 11/11/20 Extensive medical record review was performed by me  # Metastatic colon cancer.  he was diagnosed with stage I colon cancer s/p transverse colectomy on 10/17/2013.  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  Tumor extended into the submucosa. Margins were negative. + lymphovascular invasion. 14 lymph nodes were negative. Pathologic stage was T1 N0.   10/02/2014 Colonoscopy showed several 3 mm polyps and an 8 mm polyp in the cecum and transverse colon.  Pathology revealed tubular adenomas negative for high-grade dysplasia and malignancy.  02/16/2017 Colonoscopy  revealed 2 diminutive polyps in the descending colon.  Pathology revealed tubular adenomas without dysplasia or malignancy.  Her CEA has been monitored and was noted to increase starting July 2020. 04/02/2019  PET scan  limited evealed a 2.4 x 2.3 cm (SUV 11) hypermetabolic soft tissue density caudal and anterior to the pancreatic neck favored to represent isolated peritoneal or nodal metastasis in the setting of prior transverse colonic resection (expected primary drainage). Although this was immediately adjacent to the pancreas, a fat plane was maintained, arguing strongly against a pancreatic primary. Otherwise, there was no evidence of hypermetabolic metastasis.   04/17/2019 EUS on  revealed a normal esophagus, stomach, duodenum, and pancreas.  There was a  2.4 x 2.4 cm irregular mass in the retroperitoneum adjacent to, but not involving the pancreatic neck.  FNA and core needle biopsy were performed.  Pathology revealed adenocarcinoma compatible with a metastatic lesion of colorectal origin.  Tumor cells were positive for CK20 and CDX2 and negative for CK7.   NGS: [employer] on 05/15/2019 revealed + KRASG13D and TP53.  Negative results included BRAF V600E, Her2, NRAS, NTRK, PD-L1 (<1%), and TMB 8.7/Mb (intermediate).  MMR testing from his colon resection on 10/16/2013 was intact with a low probability of MSI-H.  06/25/2019, CEA 17.8. 07/09/2019 - 08/27/2019; 09/17/2019 - 10/15/2019; 11/12/2019 - 12/17/2019 He received 11 cycles of FOLFOX chemotherapy and 1 cycle of 5-FU and leucovorin (12/31/2019).  He received Neulasta after cycle #4 and #5 secondary to progressive leukopenia.  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  He received a truncated course of FOLFOX with cycle #11 secondary to oxaliplatin reaction.  08/27/2019, CEA 6.1 10/29/2019, CEA 7.7 12/31/2019, CEA 10.8.  01/14/2020 PET revealed an interval decrease in size and FDG uptake (2.4 x 2.3 cm with SUV 11 to 2.4 x 1.7 cm with SUV 4.09) associated with the previously referenced soft tissue density caudal and anterior to the pancreatic neck suggesting treatment response. There were no new sites of FDG avid tumor.  Radiation treatment 02/10/2020 - 02/23/2020.   He received a hybrid 3-dimensional course of 3000 cGy in 10 fractions to the residual FDG avid mass   05/27/2020, CEA 11.6 05/27/2020 Abdomen and pelvis CT :stable to minimally increased (2.9 x 1.8 cm compared to 2.4 x 1.7 cm) since 01/14/2020.  There were no new interval findings.    08/31/2020, CEA 30.9. 08/31/2020 Abdomen and pelvis CT revealed increased size of  the index soft tissue lesion inferior and anterior to the pancreatic neck (2.9 x 1.8 cm to 3.5 x 2.9 cm). There were similar prominent retroperitoneal lymph nodes without  adenopathy by size criteria. There was no new or enlarging abdominal or pelvic lymph nodes. There were no new interval findings. There was similar circumferential wall thickening of a nondistended urinary bladder, which likely accentuated wall thickening There was hepatic steatosis and aortic atherosclerosis.  11/03/2020, PET showed recurrent peritoneal metastasis in the upper abdomen adjacent to the pancreas.  The lesion is 3.8 x 2.9 cm with SUV of 11.3. No evidence of metastatic peritoneal disease elsewhere in the abdomen pelvis.  No evidence of distant metastasis.  Other medical problems Chronic lower extremity edema. 12/24/2018, right lower extremity duplex negative for DVT.  Small right Baker's cyst. 08/16/2019, bilateral lower extremity duplex showed no DVT.  08/16/2019 - 08/17/2019 with right lower extremity cellulitis.  He was unable to bear weight.  He was treated with IV fluids, NSAIDs, colchicine, and broad antibiotics (vancomycin and Cefepime).  He was discharged on indomethacin x 5 days and Keflex 500 mg TID x 5 days.  10/05/2020, colonoscopy showed internal hemorrhoids.  Otherwise normal examination. 11/03/2020 PET scan showed recurrent peritoneal metastasis near pancreas. Given that he has no other distant metastasis.  Repeat SBRT may be considered if he does not respond well to systemic chemotherapy.  Discussed with radiation oncology.  11/26/2020 patient was started on 5-FU and bevacizumab. 12/14/2020 CEA 29.5 02/17/2021 CT abdomen pelvis showed partial response. Mild decrease of peritoneal lesion.  Proceed with 5-FU and bevacizumab today.  Given that he is tolerating current regimen with good life quality, partial response.  Shared decision was made to hold off adding additional agents.  INTERVAL HISTORY Jared Gutierrez is a 80 y.o. male who has above history reviewed by me today presents for follow up visit for management of recurrent metastatic colon cancer Problems and complaints  are listed below: he was accompanied by wife Patient tolerated  5-FU and bevacizumab.  Patient reports feeling well.  No new complaints. Blood pressure stable today at 115/70. 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  Stable symptoms.  Not on gabapentin. Patient reports ingrown toenail  Review of Systems  Constitutional:  Negative for appetite change, chills, diaphoresis, fever and unexpected weight change.  HENT:   Negative for hearing loss, nosebleeds, sore throat and tinnitus.   Respiratory:  Positive for shortness of breath. Negative for cough and hemoptysis.   Cardiovascular:  Positive for leg swelling. Negative for chest pain and palpitations.  Gastrointestinal:  Negative for abdominal pain, blood in stool, constipation, diarrhea, nausea and vomiting.  Genitourinary:  Negative for dysuria, frequency and hematuria.   Musculoskeletal:  Positive for arthralgias. Negative for back pain, myalgias and neck pain.  Skin:  Negative for itching and rash.  Neurological:  Negative for dizziness and headaches.  Hematological:  Does not bruise/bleed easily.  Psychiatric/Behavioral:  Negative for depression. The patient is not nervous/anxious.      Past Medical History:  Diagnosis Date   Arthritis    OSTEOARTHRITIS   Cancer ([place])    Cavitary lesion of lung    RIGHT LOWER LOBE   Chicken pox    Colon cancer (HCC)    History of kidney stones    Hypertension    Lipoma of colon    Nephrolithiasis    Nephrolithiasis    Obesity    Shingles    Tubular adenoma of colon  multiple fragments    Past Surgical History:  Procedure Laterality Date   COLON SURGERY     COLONOSCOPY N/A 10/02/2014   Procedure: COLONOSCOPY;  Surgeon: Josefine Class, MD;  Location: [facility] ENDOSCOPY;  Service: Endoscopy;  Laterality: N/A;   COLONOSCOPY WITH PROPOFOL N/A 02/16/2017   Procedure: COLONOSCOPY WITH PROPOFOL;  Surgeon: Manya Silvas, MD;  Location: [facility] ENDOSCOPY;   Service: Endoscopy;  Laterality: N/A;   COLONOSCOPY WITH PROPOFOL N/A 10/05/2020   Procedure: COLONOSCOPY WITH PROPOFOL;  Surgeon: Lesly Rubenstein, MD;  Location: [facility] ENDOSCOPY;  Service: Endoscopy;  Laterality: N/A;   EUS N/A 04/17/2019   Procedure: FULL UPPER ENDOSCOPIC ULTRASOUND (EUS) RADIAL;  Surgeon: Holly Bodily, MD;  Location: [facility] ENDOSCOPY;  Service: Gastroenterology;  Laterality: N/A;   KIDNEY STONE SURGERY     PARTIAL COLECTOMY  10/17/2013   PORTACATH PLACEMENT Right 06/13/2019   Procedure: INSERTION PORT-A-CATH;  Surgeon: Benjamine Sprague, DO;  Location: [facility] ORS;  Service: General;  Laterality: Right;    Family History  Problem Relation Age of Onset   Cancer Mother    Breast cancer Mother    COPD Father     Social History:  reports that he has never smoked. He has never used smokeless tobacco. He reports current alcohol use. He reports that he does not use drugs. 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 He is married and his wife's name is [name] [phone]).    Allergies: No Known Allergies  Current Medications: Current Outpatient Medications  Medication Sig Dispense Refill   acetaminophen (TYLENOL) 500 MG tablet Take 500 mg by mouth every 6 (six) hours as needed.     amLODipine (NORVASC) 2.5 MG tablet Take 2.5 mg by mouth daily.     aspirin EC 81 MG tablet Take 81 mg by mouth daily.     Cholecalciferol (VITAMIN D) 125 MCG (5000 UT) CAPS Take 5,000 mg by mouth.     docusate sodium (COLACE) 100 MG capsule Take 100 mg by mouth 2 (two) times daily.     gabapentin (NEURONTIN) 100 MG capsule Take 1 capsule (100 mg total) by mouth 3 (three) times daily. 90 capsule 0   HYDROcodone-acetaminophen (NORCO/VICODIN) 5-325 MG tablet Take 1 tablet by mouth every 6 (six) hours as needed for moderate pain (up to 3 doses for moderate pain.).     lidocaine-prilocaine (EMLA) cream Apply to affected area once 30 g 3   loratadine (CLARITIN) 10 MG tablet  Take 10 mg by mouth daily.     olmesartan (BENICAR) 20 MG tablet Take 1 tablet by mouth daily.     ondansetron (ZOFRAN) 8 MG tablet Take 1 tablet (8 mg total) by mouth 2 (two) times daily as needed for refractory nausea / vomiting. Start on day 3 after chemotherapy. 60 tablet 1   Probiotic Product (PROBIOTIC DAILY PO) Take 1 tablet by mouth daily.     prochlorperazine (COMPAZINE) 10 MG tablet Take 1 tablet (10 mg total) by mouth every 6 (six) hours as needed (NAUSEA). 30 tablet 1   NEOMYCIN-POLYMYXIN-HYDROCORTISONE (CORTISPORIN) 1 % SOLN OTIC solution SMARTSIG:Left Ear (Patient not taking: Reported on 11/11/2020)     potassium chloride SA (KLOR-CON) 20 MEQ tablet Take by mouth. (Patient not taking: Reported on 01/11/2021)     No current facility-administered medications for this visit.   Facility-Administered Medications Ordered in Other Visits  Medication Dose Route Frequency Provider Last Rate Last Admin   0.9 %  sodium chloride infusion   Intravenous Once Corcoran, Melissa C, MD       0.9 %  sodium chloride infusion   Intravenous Continuous Nolon Stalls C, MD 10 mL/hr at 12/31/19 1000 New Bag at 10/05/20 1045   fluorouracil (ADRUCIL) 5,700 mg in sodium chloride 0.9 % 136 mL chemo infusion  2,400 mg/m2 (Order-Specific) Intravenous 1 day or 1 dose [name], MD   5,700 mg at 04/19/21 1130   heparin lock flush 100 unit/mL  500 Units Intravenous Once Corcoran, Melissa C, MD       sodium chloride flush (NS) 0.9 % injection 10 mL  10 mL Intravenous PRN [name], MD   10 mL at 04/19/21 1694    Performance status (ECOG): 1  Vitals Blood pressure 115/70, pulse 79, temperature (!) 97.5 F (36.4 C), temperature source Tympanic, weight 264 lb (119.7 kg).   Physical Exam Vitals and nursing note reviewed.  Constitutional:      General: He is not in acute distress.    Appearance: He is well-developed. He is obese. He is not diaphoretic.     Interventions: Face mask in place.     Comments:  Patient walks with a cane  HENT:     Head: Normocephalic and atraumatic.     Mouth/Throat:     Mouth: Mucous membranes are moist.     Pharynx: Oropharynx is clear.  Eyes:     General: No scleral icterus.    Extraocular Movements: Extraocular movements intact.     Conjunctiva/sclera: Conjunctivae normal.     Pupils: Pupils are equal, round, and reactive to light.  Cardiovascular:     Rate and Rhythm: Normal rate and regular rhythm.     Heart sounds: Normal heart sounds. No murmur heard. Pulmonary:     Effort: Pulmonary effort is normal. No respiratory distress.     Breath sounds: No wheezing or rales.     Comments: Decreased breath sound bilaterally Chest:     Chest wall: No tenderness.  Abdominal:     General: Bowel sounds are normal. There is no distension.     Palpations: Abdomen is soft. There is no mass.     Tenderness: There is no abdominal tenderness. There is no guarding or rebound.  Musculoskeletal:        General: No swelling or tenderness. Normal range of motion.     Cervical back: Normal range of motion and neck supple.     Right lower leg: Edema present.     Left lower leg: Edema present.  Lymphadenopathy:     Head:     Right side of head: No preauricular, posterior auricular or occipital adenopathy.     Left side of head: No preauricular, posterior auricular or occipital adenopathy.     Cervical: No cervical adenopathy.     Upper Body:     Right upper body: No supraclavicular or axillary adenopathy.     Left upper body: No supraclavicular or axillary adenopathy.     Lower Body: No right inguinal adenopathy. No left inguinal adenopathy.  Skin:    General: Skin is warm and dry.  Neurological:     General: No focal deficit present.     Mental Status: He is alert and oriented to person, place, and time. Mental status is at baseline.  Psychiatric:        Mood and Affect: Mood normal.   Infusion on 04/19/2021  Component Date Value Ref Range Status   Sodium  04/19/2021 137  135 - 145 mmol/L Final   Potassium 04/19/2021 3.4 (L)  3.5 - 5.1 mmol/L Final   Chloride 04/19/2021 103  98 - 111 mmol/L Final   CO2 04/19/2021 24  22 - 32 mmol/L Final   Glucose, Bld 04/19/2021 206 (H)  70 - 99 mg/dL Final   Glucose reference range applies only to samples taken after fasting for at least 8 hours.   BUN 04/19/2021 11  8 - 23 mg/dL Final   Creatinine, Ser 04/19/2021 0.69  0.61 - 1.24 mg/dL Final   Calcium 04/19/2021 9.2  8.9 - 10.3 mg/dL Final   Total Protein 04/19/2021 6.5  6.5 - 8.1 g/dL Final   Albumin 04/19/2021 3.3 (L)  3.5 - 5.0 g/dL Final   AST 04/19/2021 68 (H)  15 - 41 U/L Final   ALT 04/19/2021 50 (H)  0 - 44 U/L Final   Alkaline Phosphatase 04/19/2021 44  38 - 126 U/L Final   Total Bilirubin 04/19/2021 1.1  0.3 - 1.2 mg/dL Final   GFR, Estimated 04/19/2021 >60  >60 mL/min Final   Comment: (NOTE) Calculated using the CKD-EPI Creatinine Equation (2021)    Anion gap 04/19/2021 10  5 - 15 Final   Performed at [facility], [place], [place] [address]   WBC 04/19/2021 4.5  4.0 - 10.5 K/uL Final   RBC 04/19/2021 3.90 (L)  4.22 - 5.81 MIL/uL Final   Hemoglobin 04/19/2021 12.4 (L)  13.0 - 17.0 g/dL Final   HCT 04/19/2021 37.2 (L)  39.0 - 52.0 % Final   MCV 04/19/2021 95.4  80.0 - 100.0 fL Final   MCH 04/19/2021 31.8  26.0 - 34.0 pg Final   MCHC 04/19/2021 33.3  30.0 - 36.0 g/dL Final   RDW 04/19/2021 14.2  11.5 - 15.5 % Final   Platelets 04/19/2021 170  150 - 400 K/uL Final   nRBC 04/19/2021 0.0  0.0 - 0.2 % Final   Neutrophils Relative % 04/19/2021 60  % Final   Neutro Abs 04/19/2021 2.7  1.7 - 7.7 K/uL Final   Lymphocytes Relative 04/19/2021 28  % Final   Lymphs Abs 04/19/2021 1.3  0.7 - 4.0 K/uL Final   Monocytes Relative 04/19/2021 8  % Final   Monocytes Absolute 04/19/2021 0.4  0.1 - 1.0 K/uL Final   Eosinophils Relative 04/19/2021 3  % Final   Eosinophils Absolute 04/19/2021 0.1  0.0 - 0.5 K/uL Final   Basophils  Relative 04/19/2021 1  % Final   Basophils Absolute 04/19/2021 0.0  0.0 - 0.1 K/uL Final   Immature Granulocytes 04/19/2021 0  % Final   Abs Immature Granulocytes 04/19/2021 0.02  0.00 - 0.07 K/uL Final   Performed at [facility], [address] [place], [place] [address]   Total Protein, Urine 04/19/2021 20  mg/dL Final   Comment: NO NORMAL RANGE ESTABLISHED FOR THIS TEST Performed at [facility], [address] [place], [place] [address]     Assessment and plan 1. Encounter for antineoplastic chemotherapy   2. Cancer of transverse colon ([place])   3. 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 Chemotherapy-induced neuropathy ([place])   5. Anemia due to chemotherapy   6. Weight loss   7. Elevated blood sugar level    #Metastatic colon cancer-recurrent Overall he tolerates chemotherapy.  CEA continues to trend down,  Labs reviewed and discussed with patient Proceed with 5-FU and bevacizumab today.  # Chemotherapy induced neuropathy Patient has supply  of gabapentin and does not want to use due to concern of side effects.  Previously discussed about acupuncture option. Patient prefers to defer that for now and he will let me know if he wants to try acupuncture.   #Hypertension, blood pressure is stable today.  Recommend continue to monitor blood pressure at home   If SBP is persistently above 150, I recommend patient to increase Norvasc to 5 mg daily.  If SBP is less than 150, continue his current BP regimen with Norvasc 2.5 mg and Omelsartan.   #Ingrown toenail, recommend patient to make an appointment with podiatrist for further evaluation.  #Elevated glucose level.  Blood glucose level in 200s.  Check A1c. Follow-up 2 weeks lab MD 5-FU/bevacizumab -   I discussed the assessment and treatment plan with the patient.  The patient was provided an opportunity to ask questions and all were answered.  The patient agreed with the plan and demonstrated an understanding of  the instructions.  The patient was advised to call back if the symptoms worsen or if the condition fails to improve as anticipated.  [name], MD, PhD 04/19/2021

## 2021-04-19 NOTE — Patient Instructions (Signed)
[facility] CANCER CTR AT [place]  Discharge Instructions: Thank you for choosing [facility] to provide your oncology and hematology care.  If you have a lab appointment with the [facility], please go directly to the [facility] and check in at the registration area.  Wear comfortable clothing and clothing appropriate for easy access to any Portacath or PICC line.   We strive to give you quality time with your provider. You may need to reschedule your appointment if you arrive late (15 or more minutes).  Arriving late affects you and other patients whose appointments are after yours.  Also, if you miss three or more appointments without notifying the office, you may be dismissed from the clinic at the provider's discretion.      For prescription refill requests, have your pharmacy contact our office and allow 72 hours for refills to be completed.    Today you received the following chemotherapy and/or immunotherapy agents: Fluorouracil, Bevacizumab     To help prevent nausea and vomiting after your treatment, we encourage you to take your nausea medication as directed.  BELOW ARE SYMPTOMS THAT SHOULD BE REPORTED IMMEDIATELY: *FEVER GREATER THAN 100.4 F (38 C) OR HIGHER *CHILLS OR SWEATING *NAUSEA AND VOMITING THAT IS NOT CONTROLLED WITH YOUR NAUSEA MEDICATION *UNUSUAL SHORTNESS OF BREATH *UNUSUAL BRUISING OR BLEEDING *URINARY PROBLEMS (pain or burning when urinating, or frequent urination) *BOWEL PROBLEMS (unusual diarrhea, constipation, pain near the anus) TENDERNESS IN MOUTH AND THROAT WITH OR WITHOUT PRESENCE OF ULCERS (sore throat, sores in mouth, or a toothache) UNUSUAL RASH, SWELLING OR PAIN  UNUSUAL VAGINAL DISCHARGE OR ITCHING   Items with * indicate a potential emergency and should be followed up as soon as possible or go to the Emergency Department if any problems should occur.  Please show the CHEMOTHERAPY ALERT CARD or IMMUNOTHERAPY ALERT CARD  at check-in to the Emergency Department and triage nurse.  Should you have questions after your visit or need to cancel or reschedule your appointment, please contact [facility] CANCER [place] AT [place]  [phone] and follow the prompts.  Office hours are 8:00 a.m. to 4:30 p.m. Monday - Friday. Please note that voicemails left after 4:00 p.m. may not be returned until the following business day.  We are closed weekends and major holidays. You have access to a nurse at all times for urgent questions. Please call the main number to the clinic [phone] and follow the prompts.  For any non-urgent questions, you may also contact your provider using MyChart. We now offer e-Visits for anyone 44 and older to request care online for non-urgent symptoms. For details visit [email or web address].   Also download the MyChart app! Go to the app store, search "MyChart", open the app, select [place], and log in with your MyChart username and password.  Due to Covid, a mask is required upon entering the hospital/clinic. If you do not have a mask, one will be given to you upon arrival. For doctor visits, patients may have 1 support person aged 27 or older with them. For treatment visits, patients cannot have anyone with them due to current Covid guidelines and our immunocompromised population. Fluorouracil, 5-FU injection What is this medication? FLUOROURACIL, 5-FU (flure oh YOOR a sil) is a chemotherapy drug. It slows the growth of cancer cells. This medicine is used to treat many types of cancer like breast cancer, colon or rectal cancer, pancreatic cancer, and stomach cancer. This medicine may be used for other purposes; ask your  health care provider or pharmacist if you have questions. COMMON BRAND NAME(S): Adrucil What should I tell my care team before I take this medication? They need to know if you have any of these conditions: blood disorders dihydropyrimidine dehydrogenase (DPD)  deficiency infection (especially a virus infection such as chickenpox, cold sores, or herpes) kidney disease liver disease malnourished, poor nutrition recent or ongoing radiation therapy an unusual or allergic reaction to fluorouracil, other chemotherapy, other medicines, foods, dyes, or preservatives pregnant or trying to get pregnant breast-feeding How should I use this medication? This drug is given as an infusion or injection into a vein. It is administered in a hospital or clinic by a specially trained health care professional. Talk to your pediatrician regarding the use of this medicine in children. Special care may be needed. Overdosage: If you think you have taken too much of this medicine contact a poison control center or emergency room at once. NOTE: This medicine is only for you. Do not share this medicine with others. What if I miss a dose? It is important not to miss your dose. Call your doctor or health care professional if you are unable to keep an appointment. What may interact with this medication? Do not take this medicine with any of the following medications: live virus vaccines This medicine may also interact with the following medications: medicines that treat or prevent blood clots like warfarin, enoxaparin, and dalteparin This list may not describe all possible interactions. Give your health care provider a list of all the medicines, herbs, non-prescription drugs, or dietary supplements you use. Also tell them if you smoke, drink alcohol, or use illegal drugs. Some items may interact with your medicine. What should I watch for while using this medication? Visit your doctor for checks on your progress. This drug may make you feel generally unwell. This is not uncommon, as chemotherapy can affect healthy cells as well as cancer cells. Report any side effects. Continue your course of treatment even though you feel ill unless your doctor tells you to stop. In some cases,  you may be given additional medicines to help with side effects. Follow all directions for their use. Call your doctor or health care professional for advice if you get a fever, chills or sore throat, or other symptoms of a cold or flu. Do not treat yourself. This drug decreases your body's ability to fight infections. Try to avoid being around people who are sick. This medicine may increase your risk to bruise or bleed. Call your doctor or health care professional if you notice any unusual bleeding. Be careful brushing and flossing your teeth or using a toothpick because you may get an infection or bleed more easily. If you have any dental work done, tell your dentist you are receiving this medicine. Avoid taking products that contain aspirin, acetaminophen, ibuprofen, naproxen, or ketoprofen unless instructed by your doctor. These medicines may hide a fever. Do not become pregnant while taking this medicine. Women should inform their doctor if they wish to become pregnant or think they might be pregnant. There is a potential for serious side effects to an unborn child. Talk to your health care professional or pharmacist for more information. Do not breast-feed an infant while taking this medicine. Men should inform their doctor if they wish to father a child. This medicine may lower sperm counts. Do not treat diarrhea with over the counter products. Contact your doctor if you have diarrhea that lasts more than 2 days or  if it is severe and watery. This medicine can make you more sensitive to the sun. Keep out of the sun. If you cannot avoid being in the sun, wear protective clothing and use sunscreen. Do not use sun lamps or tanning beds/booths. What side effects may I notice from receiving this medication? Side effects that you should report to your doctor or health care professional as soon as possible: allergic reactions like skin rash, itching or hives, swelling of the face, lips, or tongue low  blood counts - this medicine may decrease the number of white blood cells, red blood cells and platelets. You may be at increased risk for infections and bleeding. signs of infection - fever or chills, cough, sore throat, pain or difficulty passing urine signs of decreased platelets or bleeding - bruising, pinpoint red spots on the skin, black, tarry stools, blood in the urine signs of decreased red blood cells - unusually weak or tired, fainting spells, lightheadedness breathing problems changes in vision chest pain mouth sores nausea and vomiting pain, swelling, redness at site where injected pain, tingling, numbness in the hands or feet redness, swelling, or sores on hands or feet stomach pain unusual bleeding Side effects that usually do not require medical attention (report to your doctor or health care professional if they continue or are bothersome): changes in finger or toe nails diarrhea dry or itchy skin hair loss headache loss of appetite sensitivity of eyes to the light stomach upset unusually teary eyes This list may not describe all possible side effects. Call your doctor for medical advice about side effects. You may report side effects to FDA at 1-800-FDA-1088. Where should I keep my medication? This drug is given in a hospital or clinic and will not be stored at home. NOTE: This sheet is a summary. It may not cover all possible information. If you have questions about this medicine, talk to your doctor, pharmacist, or health care provider.  2022 Elsevier/Gold Standard (2021-01-18 00:00:00) Leucovorin injection What is this medication? LEUCOVORIN (loo koe VOR in) is used to prevent or treat the harmful effects of some medicines. This medicine is used to treat anemia caused by a low amount of folic acid in the body. It is also used with 5-fluorouracil (5-FU) to treat colon cancer. This medicine may be used for other purposes; ask your health care provider or pharmacist  if you have questions. What should I tell my care team before I take this medication? They need to know if you have any of these conditions: anemia from low levels of vitamin B-12 in the blood an unusual or allergic reaction to leucovorin, folic acid, other medicines, foods, dyes, or preservatives pregnant or trying to get pregnant breast-feeding How should I use this medication? This medicine is for injection into a muscle or into a vein. It is given by a health care professional in a hospital or clinic setting. Talk to your pediatrician regarding the use of this medicine in children. Special care may be needed. Overdosage: If you think you have taken too much of this medicine contact a poison control center or emergency room at once. NOTE: This medicine is only for you. Do not share this medicine with others. What if I miss a dose? This does not apply. What may interact with this medication? capecitabine fluorouracil phenobarbital phenytoin primidone trimethoprim-sulfamethoxazole This list may not describe all possible interactions. Give your health care provider a list of all the medicines, herbs, non-prescription drugs, or dietary supplements you use.  Also tell them if you smoke, drink alcohol, or use illegal drugs. Some items may interact with your medicine. What should I watch for while using this medication? Your condition will be monitored carefully while you are receiving this medicine. This medicine may increase the side effects of 5-fluorouracil, 5-FU. Tell your doctor or health care professional if you have diarrhea or mouth sores that do not get better or that get worse. What side effects may I notice from receiving this medication? Side effects that you should report to your doctor or health care professional as soon as possible: allergic reactions like skin rash, itching or hives, swelling of the face, lips, or tongue breathing problems fever, infection mouth  sores unusual bleeding or bruising unusually weak or tired Side effects that usually do not require medical attention (report to your doctor or health care professional if they continue or are bothersome): constipation or diarrhea loss of appetite nausea, vomiting This list may not describe all possible side effects. Call your doctor for medical advice about side effects. You may report side effects to FDA at 1-800-FDA-1088. Where should I keep my medication? This drug is given in a hospital or clinic and will not be stored at home. NOTE: This sheet is a summary. It may not cover all possible information. If you have questions about this medicine, talk to your doctor, pharmacist, or health care provider.  2022 Elsevier/Gold Standard (2007-11-07 00:00:00) Bevacizumab injection What is this medication? BEVACIZUMAB (be va SIZ yoo mab) is a monoclonal antibody. It is used to treat many types of cancer. This medicine may be used for other purposes; ask your health care provider or pharmacist if you have questions. COMMON BRAND NAME(S): Alymsys, Avastin, MVASI, Noah Charon What should I tell my care team before I take this medication? They need to know if you have any of these conditions: diabetes heart disease high blood pressure history of coughing up blood prior anthracycline chemotherapy (e.g., doxorubicin, daunorubicin, epirubicin) recent or ongoing radiation therapy recent or planning to have surgery stroke an unusual or allergic reaction to bevacizumab, hamster proteins, mouse proteins, other medicines, foods, dyes, or preservatives pregnant or trying to get pregnant breast-feeding How should I use this medication? This medicine is for infusion into a vein. It is given by a health care professional in a hospital or clinic setting. Talk to your pediatrician regarding the use of this medicine in children. Special care may be needed. Overdosage: If you think you have taken too much of  this medicine contact a poison control center or emergency room at once. NOTE: This medicine is only for you. Do not share this medicine with others. What if I miss a dose? It is important not to miss your dose. Call your doctor or health care professional if you are unable to keep an appointment. What may interact with this medication? Interactions are not expected. This list may not describe all possible interactions. Give your health care provider a list of all the medicines, herbs, non-prescription drugs, or dietary supplements you use. Also tell them if you smoke, drink alcohol, or use illegal drugs. Some items may interact with your medicine. What should I watch for while using this medication? Your condition will be monitored carefully while you are receiving this medicine. You will need important blood work and urine testing done while you are taking this medicine. This medicine may increase your risk to bruise or bleed. Call your doctor or health care professional if you notice any unusual bleeding.  Before having surgery, talk to your health care provider to make sure it is ok. This drug can increase the risk of poor healing of your surgical site or wound. You will need to stop this drug for 28 days before surgery. After surgery, wait at least 28 days before restarting this drug. Make sure the surgical site or wound is healed enough before restarting this drug. Talk to your health care provider if questions. Do not become pregnant while taking this medicine or for 6 months after stopping it. Women should inform their doctor if they wish to become pregnant or think they might be pregnant. There is a potential for serious side effects to an unborn child. Talk to your health care professional or pharmacist for more information. Do not breast-feed an infant while taking this medicine and for 6 months after the last dose. This medicine has caused ovarian failure in some women. This medicine may  interfere with the ability to have a child. You should talk to your doctor or health care professional if you are concerned about your fertility. What side effects may I notice from receiving this medication? Side effects that you should report to your doctor or health care professional as soon as possible: allergic reactions like skin rash, itching or hives, swelling of the face, lips, or tongue chest pain or chest tightness chills coughing up blood high fever seizures severe constipation signs and symptoms of bleeding such as bloody or black, tarry stools; red or dark-brown urine; spitting up blood or brown material that looks like coffee grounds; red spots on the skin; unusual bruising or bleeding from the eye, gums, or nose signs and symptoms of a blood clot such as breathing problems; chest pain; severe, sudden headache; pain, swelling, warmth in the leg signs and symptoms of a stroke like changes in vision; confusion; trouble speaking or understanding; severe headaches; sudden numbness or weakness of the face, arm or leg; trouble walking; dizziness; loss of balance or coordination stomach pain sweating swelling of legs or ankles vomiting weight gain Side effects that usually do not require medical attention (report to your doctor or health care professional if they continue or are bothersome): back pain changes in taste decreased appetite dry skin nausea tiredness This list may not describe all possible side effects. Call your doctor for medical advice about side effects. You may report side effects to FDA at 1-800-FDA-1088. Where should I keep my medication? This drug is given in a hospital or clinic and will not be stored at home. NOTE: This sheet is a summary. It may not cover all possible information. If you have questions about this medicine, talk to your doctor, pharmacist, or health care provider.  2022 Elsevier/Gold Standard (2021-01-18 00:00:00)

## 2021-04-20 LAB — CEA: CEA: 10 ng/mL — ABNORMAL HIGH (ref 0.0–4.7)

## 2021-04-21 ENCOUNTER — Other Ambulatory Visit: Payer: Self-pay

## 2021-04-21 ENCOUNTER — Inpatient Hospital Stay: Payer: Medicare Other

## 2021-04-21 DIAGNOSIS — Z5112 Encounter for antineoplastic immunotherapy: Secondary | ICD-10-CM | POA: Diagnosis not present

## 2021-04-21 DIAGNOSIS — Z85038 Personal history of other malignant neoplasm of large intestine: Secondary | ICD-10-CM

## 2021-04-21 DIAGNOSIS — C786 Secondary malignant neoplasm of retroperitoneum and peritoneum: Secondary | ICD-10-CM

## 2021-04-21 MED ORDER — HEPARIN SOD (PORK) LOCK FLUSH 100 UNIT/ML IV SOLN
500.0000 [IU] | Freq: Once | INTRAVENOUS | Status: AC | PRN
  Administered 2021-04-21: 500 [IU]
  Filled 2021-04-21: qty 5

## 2021-04-21 MED ORDER — SODIUM CHLORIDE 0.9% FLUSH
10.0000 mL | INTRAVENOUS | Status: DC | PRN
  Administered 2021-04-21: 10 mL
  Filled 2021-04-21: qty 10

## 2021-05-03 ENCOUNTER — Inpatient Hospital Stay: Payer: Medicare Other

## 2021-05-03 ENCOUNTER — Other Ambulatory Visit: Payer: Self-pay

## 2021-05-03 ENCOUNTER — Inpatient Hospital Stay (HOSPITAL_BASED_OUTPATIENT_CLINIC_OR_DEPARTMENT_OTHER): Payer: Medicare Other | Admitting: Oncology

## 2021-05-03 ENCOUNTER — Encounter: Payer: Self-pay | Admitting: Oncology

## 2021-05-03 VITALS — BP 142/78 | HR 68 | Temp 97.8°F | Resp 18 | Wt 261.3 lb

## 2021-05-03 DIAGNOSIS — C189 Malignant neoplasm of colon, unspecified: Secondary | ICD-10-CM

## 2021-05-03 DIAGNOSIS — C786 Secondary malignant neoplasm of retroperitoneum and peritoneum: Secondary | ICD-10-CM

## 2021-05-03 DIAGNOSIS — C184 Malignant neoplasm of transverse colon: Secondary | ICD-10-CM | POA: Diagnosis not present

## 2021-05-03 DIAGNOSIS — T451X5A Adverse effect of antineoplastic and immunosuppressive drugs, initial encounter: Secondary | ICD-10-CM

## 2021-05-03 DIAGNOSIS — Z5111 Encounter for antineoplastic chemotherapy: Secondary | ICD-10-CM

## 2021-05-03 DIAGNOSIS — G620 Drug-induced polyneuropathy: Secondary | ICD-10-CM

## 2021-05-03 DIAGNOSIS — Z85038 Personal history of other malignant neoplasm of large intestine: Secondary | ICD-10-CM

## 2021-05-03 DIAGNOSIS — D6481 Anemia due to antineoplastic chemotherapy: Secondary | ICD-10-CM | POA: Diagnosis not present

## 2021-05-03 DIAGNOSIS — Z5112 Encounter for antineoplastic immunotherapy: Secondary | ICD-10-CM | POA: Diagnosis not present

## 2021-05-03 DIAGNOSIS — E876 Hypokalemia: Secondary | ICD-10-CM

## 2021-05-03 LAB — CBC WITH DIFFERENTIAL/PLATELET
Abs Immature Granulocytes: 0.01 10*3/uL (ref 0.00–0.07)
Basophils Absolute: 0 10*3/uL (ref 0.0–0.1)
Basophils Relative: 1 %
Eosinophils Absolute: 0.1 10*3/uL (ref 0.0–0.5)
Eosinophils Relative: 3 %
HCT: 37.7 % — ABNORMAL LOW (ref 39.0–52.0)
Hemoglobin: 12.8 g/dL — ABNORMAL LOW (ref 13.0–17.0)
Immature Granulocytes: 0 %
Lymphocytes Relative: 29 %
Lymphs Abs: 1.3 10*3/uL (ref 0.7–4.0)
MCH: 32.1 pg (ref 26.0–34.0)
MCHC: 34 g/dL (ref 30.0–36.0)
MCV: 94.5 fL (ref 80.0–100.0)
Monocytes Absolute: 0.3 10*3/uL (ref 0.1–1.0)
Monocytes Relative: 8 %
Neutro Abs: 2.6 10*3/uL (ref 1.7–7.7)
Neutrophils Relative %: 59 %
Platelets: 165 10*3/uL (ref 150–400)
RBC: 3.99 MIL/uL — ABNORMAL LOW (ref 4.22–5.81)
RDW: 14.2 % (ref 11.5–15.5)
WBC: 4.4 10*3/uL (ref 4.0–10.5)
nRBC: 0 % (ref 0.0–0.2)

## 2021-05-03 LAB — COMPREHENSIVE METABOLIC PANEL
ALT: 29 U/L (ref 0–44)
AST: 39 U/L (ref 15–41)
Albumin: 3.6 g/dL (ref 3.5–5.0)
Alkaline Phosphatase: 41 U/L (ref 38–126)
Anion gap: 7 (ref 5–15)
BUN: 9 mg/dL (ref 8–23)
CO2: 25 mmol/L (ref 22–32)
Calcium: 8.8 mg/dL — ABNORMAL LOW (ref 8.9–10.3)
Chloride: 101 mmol/L (ref 98–111)
Creatinine, Ser: 0.69 mg/dL (ref 0.61–1.24)
GFR, Estimated: >60 mL/min (ref >60)
Glucose, Bld: 141 mg/dL — ABNORMAL HIGH (ref 70–99)
Potassium: 3.1 mmol/L — ABNORMAL LOW (ref 3.5–5.1)
Sodium: 133 mmol/L — ABNORMAL LOW (ref 135–145)
Total Bilirubin: 1.5 mg/dL — ABNORMAL HIGH (ref 0.3–1.2)
Total Protein: 6.8 g/dL (ref 6.5–8.1)

## 2021-05-03 LAB — PROTEIN, URINE, RANDOM: Total Protein, Urine: 27 mg/dL

## 2021-05-03 MED ORDER — SODIUM CHLORIDE 0.9 % IV SOLN
5.0000 mg/kg | Freq: Once | INTRAVENOUS | Status: AC
  Administered 2021-05-03: 10:00:00 600 mg via INTRAVENOUS
  Filled 2021-05-03: qty 16

## 2021-05-03 MED ORDER — FLUOROURACIL CHEMO INJECTION 2.5 GM/50ML
400.0000 mg/m2 | Freq: Once | INTRAVENOUS | Status: AC
  Administered 2021-05-03: 950 mg via INTRAVENOUS
  Filled 2021-05-03: qty 19

## 2021-05-03 MED ORDER — SODIUM CHLORIDE 0.9 % IV SOLN
2400.0000 mg/m2 | INTRAVENOUS | Status: DC
  Administered 2021-05-03: 12:00:00 5700 mg via INTRAVENOUS
  Filled 2021-05-03 (×3): qty 114

## 2021-05-03 MED ORDER — POTASSIUM CHLORIDE 20 MEQ/100ML IV SOLN
20.0000 meq | Freq: Once | INTRAVENOUS | Status: AC
  Administered 2021-05-03: 10:00:00 20 meq via INTRAVENOUS

## 2021-05-03 MED ORDER — SODIUM CHLORIDE 0.9 % IV SOLN
950.0000 mg | Freq: Once | INTRAVENOUS | Status: AC
  Administered 2021-05-03: 10:00:00 950 mg via INTRAVENOUS
  Filled 2021-05-03 (×2): qty 47.5

## 2021-05-03 MED ORDER — POTASSIUM CHLORIDE CRYS ER 20 MEQ PO TBCR: 20.0000 meq | EXTENDED_RELEASE_TABLET | Freq: Two times a day (BID) | ORAL | 0 refills | Status: DC

## 2021-05-03 MED ORDER — SODIUM CHLORIDE 0.9 % IV SOLN
10.0000 mg | Freq: Once | INTRAVENOUS | Status: AC
  Administered 2021-05-03: 09:00:00 10 mg via INTRAVENOUS
  Filled 2021-05-03: qty 10

## 2021-05-03 MED ORDER — SODIUM CHLORIDE 0.9% FLUSH
10.0000 mL | INTRAVENOUS | Status: DC | PRN
  Filled 2021-05-03: qty 10

## 2021-05-03 MED ORDER — HEPARIN SOD (PORK) LOCK FLUSH 100 UNIT/ML IV SOLN
500.0000 [IU] | Freq: Once | INTRAVENOUS | Status: DC | PRN
  Filled 2021-05-03: qty 5

## 2021-05-03 MED ORDER — SODIUM CHLORIDE 0.9 % IV SOLN
Freq: Once | INTRAVENOUS | Status: AC
  Filled 2021-05-03: qty 250

## 2021-05-03 NOTE — Patient Instructions (Signed)
[facility] CANCER CTR AT [place]  Discharge Instructions: Thank you for choosing [place] to provide your oncology and hematology care.  If you have a lab appointment with the [place], please go directly to the [place] and check in at the registration area.  Wear comfortable clothing and clothing appropriate for easy access to any Portacath or PICC line.   We strive to give you quality time with your provider. You may need to reschedule your appointment if you arrive late (15 or more minutes).  Arriving late affects you and other patients whose appointments are after yours.  Also, if you miss three or more appointments without notifying the office, you may be dismissed from the clinic at the providers discretion.      For prescription refill requests, have your pharmacy contact our office and allow 72 hours for refills to be completed.    Today you received the following chemotherapy and/or immunotherapy agents Zirabev & 5FU      To help prevent nausea and vomiting after your treatment, we encourage you to take your nausea medication as directed.  BELOW ARE SYMPTOMS THAT SHOULD BE REPORTED IMMEDIATELY: *FEVER GREATER THAN 100.4 F (38 C) OR HIGHER *CHILLS OR SWEATING *NAUSEA AND VOMITING THAT IS NOT CONTROLLED WITH YOUR NAUSEA MEDICATION *UNUSUAL SHORTNESS OF BREATH *UNUSUAL BRUISING OR BLEEDING *URINARY PROBLEMS (pain or burning when urinating, or frequent urination) *BOWEL PROBLEMS (unusual diarrhea, constipation, pain near the anus) TENDERNESS IN MOUTH AND THROAT WITH OR WITHOUT PRESENCE OF ULCERS (sore throat, sores in mouth, or a toothache) UNUSUAL RASH, SWELLING OR PAIN  UNUSUAL VAGINAL DISCHARGE OR ITCHING   Items with * indicate a potential emergency and should be followed up as soon as possible or go to the Emergency Department if any problems should occur.  Please show the CHEMOTHERAPY ALERT CARD or IMMUNOTHERAPY ALERT CARD at check-in  to the Emergency Department and triage nurse.  Should you have questions after your visit or need to cancel or reschedule your appointment, please contact [facility] CANCER [place] AT [place]  [phone] and follow the prompts.  Office hours are 8:00 a.m. to 4:30 p.m. Monday - Friday. Please note that voicemails left after 4:00 p.m. may not be returned until the following business day.  We are closed weekends and major holidays. You have access to a nurse at all times for urgent questions. Please call the main number to the clinic [phone] and follow the prompts.  For any non-urgent questions, you may also contact your provider using MyChart. We now offer e-Visits for anyone 75 and older to request care online for non-urgent symptoms. For details visit [email or web address].   Also download the MyChart app! Go to the app store, search "MyChart", open the app, select [place], and log in with your MyChart username and password.  Due to Covid, a mask is required upon entering the hospital/clinic. If you do not have a mask, one will be given to you upon arrival. For doctor visits, patients may have 1 support person aged 47 or older with them. For treatment visits, patients cannot have anyone with them due to current Covid guidelines and our immunocompromised population.

## 2021-05-03 NOTE — Progress Notes (Signed)
Per Darci Current, RN per Dr. Tasia Catchings, proceed with Avastin using 12/6 Urine Protein results. Patient to receive K 20 mEq IV.   Bili 1.5, Per Darci Current., RN per Dr. Tasia Catchings, okay to proceed.

## 2021-05-03 NOTE — Progress Notes (Signed)
Hematology Oncology Progress Note   Clinic Day: 05/03/2021   Referring physician: Tracie Harrier, MD  Chief Complaint: Jared Gutierrez is a 80 y.o. male presents for follow-up of metastatic colon cancer   PERTINENT ONCOLOGY HISTORY Jared Gutierrez is a 80 y.o.[place] who has above oncology history reviewed by me today presented for follow up visit for management of  Metastatic colon cancer. [name], patient switched care to me on 11/11/20 Extensive medical record review was performed by me  # Metastatic colon cancer.  he was diagnosed with stage I colon cancer s/p transverse colectomy on 10/17/2013.  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  Tumor extended into the submucosa. Margins were negative. + lymphovascular invasion. 14 lymph nodes were negative. Pathologic stage was T1 N0.   10/02/2014 Colonoscopy showed several 3 mm polyps and an 8 mm polyp in the cecum and transverse colon.  Pathology revealed tubular adenomas negative for high-grade dysplasia and malignancy.  02/16/2017 Colonoscopy  revealed 2 diminutive polyps in the descending colon.  Pathology revealed tubular adenomas without dysplasia or malignancy.  Her CEA has been monitored and was noted to increase starting July 2020. 04/02/2019  PET scan  limited evealed a 2.4 x 2.3 cm (SUV 11) hypermetabolic soft tissue density caudal and anterior to the pancreatic neck favored to represent isolated peritoneal or nodal metastasis in the setting of prior transverse colonic resection (expected primary drainage). Although this was immediately adjacent to the pancreas, a fat plane was maintained, arguing strongly against a pancreatic primary. Otherwise, there was no evidence of hypermetabolic metastasis.   04/17/2019 EUS on  revealed a normal esophagus, stomach, duodenum, and pancreas.  There was a  2.4 x 2.4 cm irregular mass in the retroperitoneum adjacent to, but not involving the pancreatic neck.  FNA and core needle biopsy were performed.  Pathology revealed adenocarcinoma compatible with a metastatic lesion of colorectal origin.  Tumor cells were positive for CK20 and CDX2 and negative for CK7.   NGS: [employer] on 05/15/2019 revealed + KRASG13D and TP53.  Negative results included BRAF V600E, Her2, NRAS, NTRK, PD-L1 (<1%), and TMB 8.7/Mb (intermediate).  MMR testing from his colon resection on 10/16/2013 was intact with a low probability of MSI-H.  06/25/2019, CEA 17.8. 07/09/2019 - 08/27/2019; 09/17/2019 - 10/15/2019; 11/12/2019 - 12/17/2019 He received 11 cycles of FOLFOX chemotherapy and 1 cycle of 5-FU and leucovorin (12/31/2019).  He received Neulasta after cycle #4 and #5 secondary to progressive leukopenia.  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  He received a truncated course of FOLFOX with cycle #11 secondary to oxaliplatin reaction.  08/27/2019, CEA 6.1 10/29/2019, CEA 7.7 12/31/2019, CEA 10.8.  01/14/2020 PET revealed an interval decrease in size and FDG uptake (2.4 x 2.3 cm with SUV 11 to 2.4 x 1.7 cm with SUV 4.09) associated with the previously referenced soft tissue density caudal and anterior to the pancreatic neck suggesting treatment response. There were no new sites of FDG avid tumor.  Radiation treatment 02/10/2020 - 02/23/2020.   He received a hybrid 3-dimensional course of 3000 cGy in 10 fractions to the residual FDG avid mass   05/27/2020, CEA 11.6 05/27/2020 Abdomen and pelvis CT :stable to minimally increased (2.9 x 1.8 cm compared to 2.4 x 1.7 cm) since 01/14/2020.  There were no new interval findings.    08/31/2020, CEA 30.9. 08/31/2020 Abdomen and pelvis CT revealed increased size of  the index soft tissue lesion inferior and anterior to the pancreatic neck (2.9 x 1.8 cm to 3.5 x 2.9 cm). There were similar prominent retroperitoneal lymph nodes without  adenopathy by size criteria. There was no new or enlarging abdominal or pelvic lymph nodes. There were no new interval findings. There was similar circumferential wall thickening of a nondistended urinary bladder, which likely accentuated wall thickening There was hepatic steatosis and aortic atherosclerosis.  11/03/2020, PET showed recurrent peritoneal metastasis in the upper abdomen adjacent to the pancreas.  The lesion is 3.8 x 2.9 cm with SUV of 11.3. No evidence of metastatic peritoneal disease elsewhere in the abdomen pelvis.  No evidence of distant metastasis.  Other medical problems Chronic lower extremity edema. 12/24/2018, right lower extremity duplex negative for DVT.  Small right Baker's cyst. 08/16/2019, bilateral lower extremity duplex showed no DVT.  08/16/2019 - 08/17/2019 with right lower extremity cellulitis.  He was unable to bear weight.  He was treated with IV fluids, NSAIDs, colchicine, and broad antibiotics (vancomycin and Cefepime).  He was discharged on indomethacin x 5 days and Keflex 500 mg TID x 5 days.  10/05/2020, colonoscopy showed internal hemorrhoids.  Otherwise normal examination. 11/03/2020 PET scan showed recurrent peritoneal metastasis near pancreas. Given that he has no other distant metastasis.  Repeat SBRT may be considered if he does not respond well to systemic chemotherapy.  Discussed with radiation oncology.  11/26/2020 patient was started on 5-FU and bevacizumab. 12/14/2020 CEA 29.5 02/17/2021 CT abdomen pelvis showed partial response. Mild decrease of peritoneal lesion.  Proceed with 5-FU and bevacizumab today.  Given that he is tolerating current regimen with good life quality, partial response.  Shared decision was made to hold off adding additional agents.  INTERVAL HISTORY Jared Gutierrez is a 80 y.o. male who has above history reviewed by me today presents for follow up visit for management of recurrent metastatic colon cancer Problems and complaints  are listed below: he was accompanied by wife Patient tolerated  5-FU and bevacizumab.  Patient reports feeling well.  No new complaints Blood pressure is 142/78.  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  Unchanged.  Not on gabapentin. Patient reports ingrown toenail and he is going to see podiatrist.  Review of Systems  Constitutional:  Negative for appetite change, chills, diaphoresis, fever and unexpected weight change.  HENT:   Negative for hearing loss, nosebleeds, sore throat and tinnitus.   Respiratory:  Positive for shortness of breath. Negative for cough and hemoptysis.   Cardiovascular:  Positive for leg swelling. Negative for chest pain and palpitations.  Gastrointestinal:  Negative for abdominal pain, blood in stool, constipation, diarrhea, nausea and vomiting.  Genitourinary:  Negative for dysuria, frequency and hematuria.   Musculoskeletal:  Positive for arthralgias. Negative for back pain, myalgias and neck pain.  Skin:  Negative for itching and rash.  Neurological:  Negative for dizziness and headaches.  Hematological:  Does not bruise/bleed easily.  Psychiatric/Behavioral:  Negative for depression. The patient is not nervous/anxious.      Past Medical History:  Diagnosis Date   Arthritis    OSTEOARTHRITIS   Cancer ([place])    Cavitary lesion of lung    RIGHT LOWER LOBE   Chicken pox    Colon cancer ([facility])    History of kidney stones    Hypertension    Lipoma of colon    Nephrolithiasis    Nephrolithiasis    Obesity    Shingles    Tubular  adenoma of colon    multiple fragments    Past Surgical History:  Procedure Laterality Date   COLON SURGERY     COLONOSCOPY N/A 10/02/2014   Procedure: COLONOSCOPY;  Surgeon: Josefine Class, MD;  [facility] ENDOSCOPY;  Service: Endoscopy;  Laterality: N/A;   COLONOSCOPY WITH PROPOFOL N/A 02/16/2017   Procedure: COLONOSCOPY WITH PROPOFOL;  Surgeon: Manya Silvas, MD;  Location: [facility]  ENDOSCOPY;  Service: Endoscopy;  Laterality: N/A;   COLONOSCOPY WITH PROPOFOL N/A 10/05/2020   Procedure: COLONOSCOPY WITH PROPOFOL;  Surgeon: Lesly Rubenstein, MD;  Location: [facility] ENDOSCOPY;  Service: Endoscopy;  Laterality: N/A;   EUS N/A 04/17/2019   Procedure: FULL UPPER ENDOSCOPIC ULTRASOUND (EUS) RADIAL;  Surgeon: Holly Bodily, MD;  Location: [facility] ENDOSCOPY;  Service: Gastroenterology;  Laterality: N/A;   KIDNEY STONE SURGERY     PARTIAL COLECTOMY  10/17/2013   PORTACATH PLACEMENT Right 06/13/2019   Procedure: INSERTION PORT-A-CATH;  Surgeon: Benjamine Sprague, DO;  Location: [facility] ORS;  Service: General;  Laterality: Right;    Family History  Problem Relation Age of Onset   Cancer Mother    Breast cancer Mother    COPD Father     Social History:  reports that he has never smoked. He has never used smokeless tobacco. He reports current alcohol use. He reports that he does not use drugs. 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 He is married and his wife's name is [name] [phone]).    Allergies: No Known Allergies  Current Medications: Current Outpatient Medications  Medication Sig Dispense Refill   acetaminophen (TYLENOL) 500 MG tablet Take 500 mg by mouth every 6 (six) hours as needed.     amLODipine (NORVASC) 2.5 MG tablet Take 2.5 mg by mouth daily.     aspirin EC 81 MG tablet Take 81 mg by mouth daily.     Cholecalciferol (VITAMIN D) 125 MCG (5000 UT) CAPS Take 5,000 mg by mouth.     docusate sodium (COLACE) 100 MG capsule Take 100 mg by mouth 2 (two) times daily.     gabapentin (NEURONTIN) 100 MG capsule Take 1 capsule (100 mg total) by mouth 3 (three) times daily. 90 capsule 0   HYDROcodone-acetaminophen (NORCO/VICODIN) 5-325 MG tablet Take 1 tablet by mouth every 6 (six) hours as needed for moderate pain (up to 3 doses for moderate pain.).     lidocaine-prilocaine (EMLA) cream Apply to affected area once 30 g 3   loratadine (CLARITIN)  10 MG tablet Take 10 mg by mouth daily.     olmesartan (BENICAR) 20 MG tablet Take 1 tablet by mouth daily.     ondansetron (ZOFRAN) 8 MG tablet Take 1 tablet (8 mg total) by mouth 2 (two) times daily as needed for refractory nausea / vomiting. Start on day 3 after chemotherapy. 60 tablet 1   Probiotic Product (PROBIOTIC DAILY PO) Take 1 tablet by mouth daily.     prochlorperazine (COMPAZINE) 10 MG tablet Take 1 tablet (10 mg total) by mouth every 6 (six) hours as needed (NAUSEA). 30 tablet 1   NEOMYCIN-POLYMYXIN-HYDROCORTISONE (CORTISPORIN) 1 % SOLN OTIC solution SMARTSIG:Left Ear (Patient not taking: Reported on 05/03/2021)     potassium chloride SA (KLOR-CON M) 20 MEQ tablet Take 1 tablet (20 mEq total) by mouth 2 (two) times daily. 60 tablet 0   No current facility-administered medications for this visit.   Facility-Administered Medications Ordered in Other Visits  Medication Dose  Route Frequency Provider Last Rate Last Admin   0.9 %  sodium chloride infusion   Intravenous Once Corcoran, Melissa C, MD       0.9 %  sodium chloride infusion   Intravenous Continuous Nolon Stalls C, MD 10 mL/hr at 12/31/19 1000 New Bag at 10/05/20 1045   heparin lock flush 100 unit/mL  500 Units Intravenous Once Lequita Asal, MD        Performance status (ECOG): 1  Vitals Blood pressure (!) 142/78, pulse 68, temperature 97.8 F (36.6 C), resp. rate 18, weight 261 lb 4.8 oz (118.5 kg).   Physical Exam Vitals and nursing note reviewed.  Constitutional:      General: He is not in acute distress.    Appearance: He is well-developed. He is obese. He is not diaphoretic.     Interventions: Face mask in place.     Comments: Patient walks with a cane  HENT:     Head: Normocephalic and atraumatic.     Mouth/Throat:     Mouth: Mucous membranes are moist.     Pharynx: Oropharynx is clear.  Eyes:     General: No scleral icterus.    Extraocular Movements: Extraocular movements intact.      Conjunctiva/sclera: Conjunctivae normal.     Pupils: Pupils are equal, round, and reactive to light.  Cardiovascular:     Rate and Rhythm: Normal rate and regular rhythm.     Heart sounds: Normal heart sounds. No murmur heard. Pulmonary:     Effort: Pulmonary effort is normal. No respiratory distress.     Breath sounds: No wheezing or rales.     Comments: Decreased breath sound bilaterally Chest:     Chest wall: No tenderness.  Abdominal:     General: Bowel sounds are normal. There is no distension.     Palpations: Abdomen is soft. There is no mass.     Tenderness: There is no abdominal tenderness. There is no guarding or rebound.  Musculoskeletal:        General: No swelling or tenderness. Normal range of motion.     Cervical back: Normal range of motion and neck supple.     Right lower leg: Edema present.     Left lower leg: Edema present.  Lymphadenopathy:     Head:     Right side of head: No preauricular, posterior auricular or occipital adenopathy.     Left side of head: No preauricular, posterior auricular or occipital adenopathy.     Cervical: No cervical adenopathy.     Upper Body:     Right upper body: No supraclavicular or axillary adenopathy.     Left upper body: No supraclavicular or axillary adenopathy.     Lower Body: No right inguinal adenopathy. No left inguinal adenopathy.  Skin:    General: Skin is warm and dry.  Neurological:     General: No focal deficit present.     Mental Status: He is alert and oriented to person, place, and time. Mental status is at baseline.  Psychiatric:        Mood and Affect: Mood normal.   Infusion on 05/03/2021  Component Date Value Ref Range Status   Sodium 05/03/2021 133 (L)  135 - 145 mmol/L Final   Potassium 05/03/2021 3.1 (L)  3.5 - 5.1 mmol/L Final   Chloride 05/03/2021 101  98 - 111 mmol/L Final   CO2 05/03/2021 25  22 - 32 mmol/L Final   Glucose, Bld 05/03/2021 141 (H)  70 - 99 mg/dL Final   Glucose reference range  applies only to samples taken after fasting for at least 8 hours.   BUN 05/03/2021 9  8 - 23 mg/dL Final   Creatinine, Ser 05/03/2021 0.69  0.61 - 1.24 mg/dL Final   Calcium 05/03/2021 8.8 (L)  8.9 - 10.3 mg/dL Final   Total Protein 05/03/2021 6.8  6.5 - 8.1 g/dL Final   Albumin 05/03/2021 3.6  3.5 - 5.0 g/dL Final   AST 05/03/2021 39  15 - 41 U/L Final   ALT 05/03/2021 29  0 - 44 U/L Final   Alkaline Phosphatase 05/03/2021 41  38 - 126 U/L Final   Total Bilirubin 05/03/2021 1.5 (H)  0.3 - 1.2 mg/dL Final   GFR, Estimated 05/03/2021 >60  >60 mL/min Final   Comment: (NOTE) Calculated using the CKD-EPI Creatinine Equation (2021)    Anion gap 05/03/2021 7  5 - 15 Final   Performed at [facility], [place], [place] [address]   WBC 05/03/2021 4.4  4.0 - 10.5 K/uL Final   RBC 05/03/2021 3.99 (L)  4.22 - 5.81 MIL/uL Final   Hemoglobin 05/03/2021 12.8 (L)  13.0 - 17.0 g/dL Final   HCT 05/03/2021 37.7 (L)  39.0 - 52.0 % Final   MCV 05/03/2021 94.5  80.0 - 100.0 fL Final   MCH 05/03/2021 32.1  26.0 - 34.0 pg Final   MCHC 05/03/2021 34.0  30.0 - 36.0 g/dL Final   RDW 05/03/2021 14.2  11.5 - 15.5 % Final   Platelets 05/03/2021 165  150 - 400 K/uL Final   nRBC 05/03/2021 0.0  0.0 - 0.2 % Final   Neutrophils Relative % 05/03/2021 59  % Final   Neutro Abs 05/03/2021 2.6  1.7 - 7.7 K/uL Final   Lymphocytes Relative 05/03/2021 29  % Final   Lymphs Abs 05/03/2021 1.3  0.7 - 4.0 K/uL Final   Monocytes Relative 05/03/2021 8  % Final   Monocytes Absolute 05/03/2021 0.3  0.1 - 1.0 K/uL Final   Eosinophils Relative 05/03/2021 3  % Final   Eosinophils Absolute 05/03/2021 0.1  0.0 - 0.5 K/uL Final   Basophils Relative 05/03/2021 1  % Final   Basophils Absolute 05/03/2021 0.0  0.0 - 0.1 K/uL Final   Immature Granulocytes 05/03/2021 0  % Final   Abs Immature Granulocytes 05/03/2021 0.01  0.00 - 0.07 K/uL Final   Performed at [facility], [address] [place], [place]  [address]   Total Protein, Urine 05/03/2021 27  mg/dL Final   Comment: NO NORMAL RANGE ESTABLISHED FOR THIS TEST Performed at [facility], [address], [place], [place] [address]     Assessment and plan 1. Cancer of transverse colon ([place])   2. 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 Encounter for antineoplastic chemotherapy   4. Chemotherapy-induced neuropathy ([place])   5. Anemia due to chemotherapy    #Metastatic colon cancer-recurrent Overall he tolerates chemotherapy.  CEA continues to trend down Labs reviewed and discussed with patient Proceed with 5-FU/bevacizumab today.  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  Patient has gabapentin supply at home. Patient also would like to defer acupuncture  #Hypertension, continue monitor BP at home.  If SBP is persistently above 150, I recommend patient to increase Norvasc to 5 mg daily.  If SBP is less than 150, continue his current BP regimen with Norvasc 2.5 mg and Omelsartan.   #  Ingrown toenail, recommend patient to make an appointment with podiatrist for further evaluation.  #Elevated glucose level.  A1c is normal.  Encourage patient to further discuss with primary care provider at his regular visits . Follow-up 2 weeks lab MD 5-FU/bevacizumab -   I discussed the assessment and treatment plan with the patient.  The patient was provided an opportunity to ask questions and all were answered.  The patient agreed with the plan and demonstrated an understanding of the instructions.  The patient was advised to call back if the symptoms worsen or if the condition fails to improve as anticipated.  [name], MD, PhD 05/03/2021

## 2021-05-03 NOTE — Progress Notes (Signed)
Pt here for follow up. No new concerns voiced.   

## 2021-05-04 LAB — CEA: CEA: 9.7 ng/mL — ABNORMAL HIGH (ref 0.0–4.7)

## 2021-05-05 ENCOUNTER — Other Ambulatory Visit: Payer: Self-pay

## 2021-05-05 ENCOUNTER — Inpatient Hospital Stay: Payer: Medicare Other

## 2021-05-05 VITALS — BP 162/84 | HR 59 | Temp 97.4°F | Resp 18

## 2021-05-05 DIAGNOSIS — Z5112 Encounter for antineoplastic immunotherapy: Secondary | ICD-10-CM | POA: Diagnosis not present

## 2021-05-05 DIAGNOSIS — C786 Secondary malignant neoplasm of retroperitoneum and peritoneum: Secondary | ICD-10-CM

## 2021-05-05 DIAGNOSIS — Z85038 Personal history of other malignant neoplasm of large intestine: Secondary | ICD-10-CM

## 2021-05-05 MED ORDER — SODIUM CHLORIDE 0.9% FLUSH
10.0000 mL | INTRAVENOUS | Status: DC | PRN
  Administered 2021-05-05: 13:00:00 10 mL
  Filled 2021-05-05: qty 10

## 2021-05-05 MED ORDER — HEPARIN SOD (PORK) LOCK FLUSH 100 UNIT/ML IV SOLN
500.0000 [IU] | Freq: Once | INTRAVENOUS | Status: AC | PRN
  Administered 2021-05-05: 13:00:00 500 [IU]
  Filled 2021-05-05: qty 5

## 2021-05-18 ENCOUNTER — Other Ambulatory Visit: Payer: Self-pay

## 2021-05-18 ENCOUNTER — Inpatient Hospital Stay (HOSPITAL_BASED_OUTPATIENT_CLINIC_OR_DEPARTMENT_OTHER): Payer: Medicare Other | Admitting: Oncology

## 2021-05-18 ENCOUNTER — Encounter: Payer: Self-pay | Admitting: Oncology

## 2021-05-18 ENCOUNTER — Inpatient Hospital Stay: Payer: Medicare Other

## 2021-05-18 ENCOUNTER — Inpatient Hospital Stay: Payer: Medicare Other | Attending: Oncology

## 2021-05-18 VITALS — BP 93/59 | HR 87 | Temp 97.6°F | Wt 269.1 lb

## 2021-05-18 VITALS — BP 123/68 | HR 60 | Temp 97.2°F

## 2021-05-18 DIAGNOSIS — T451X5D Adverse effect of antineoplastic and immunosuppressive drugs, subsequent encounter: Secondary | ICD-10-CM | POA: Diagnosis not present

## 2021-05-18 DIAGNOSIS — R2243 Localized swelling, mass and lump, lower limb, bilateral: Secondary | ICD-10-CM | POA: Insufficient documentation

## 2021-05-18 DIAGNOSIS — D6481 Anemia due to antineoplastic chemotherapy: Secondary | ICD-10-CM

## 2021-05-18 DIAGNOSIS — Z5112 Encounter for antineoplastic immunotherapy: Secondary | ICD-10-CM | POA: Diagnosis not present

## 2021-05-18 DIAGNOSIS — E669 Obesity, unspecified: Secondary | ICD-10-CM | POA: Insufficient documentation

## 2021-05-18 DIAGNOSIS — I9589 Other hypotension: Secondary | ICD-10-CM

## 2021-05-18 DIAGNOSIS — Z79899 Other long term (current) drug therapy: Secondary | ICD-10-CM | POA: Insufficient documentation

## 2021-05-18 DIAGNOSIS — Z809 Family history of malignant neoplasm, unspecified: Secondary | ICD-10-CM | POA: Diagnosis not present

## 2021-05-18 DIAGNOSIS — Z836 Family history of other diseases of the respiratory system: Secondary | ICD-10-CM | POA: Insufficient documentation

## 2021-05-18 DIAGNOSIS — C786 Secondary malignant neoplasm of retroperitoneum and peritoneum: Secondary | ICD-10-CM

## 2021-05-18 DIAGNOSIS — I959 Hypotension, unspecified: Secondary | ICD-10-CM | POA: Diagnosis not present

## 2021-05-18 DIAGNOSIS — T451X5A Adverse effect of antineoplastic and immunosuppressive drugs, initial encounter: Secondary | ICD-10-CM

## 2021-05-18 DIAGNOSIS — G620 Drug-induced polyneuropathy: Secondary | ICD-10-CM | POA: Diagnosis not present

## 2021-05-18 DIAGNOSIS — Z5111 Encounter for antineoplastic chemotherapy: Secondary | ICD-10-CM

## 2021-05-18 DIAGNOSIS — Z86010 Personal history of colonic polyps: Secondary | ICD-10-CM | POA: Diagnosis not present

## 2021-05-18 DIAGNOSIS — E861 Hypovolemia: Secondary | ICD-10-CM | POA: Insufficient documentation

## 2021-05-18 DIAGNOSIS — C189 Malignant neoplasm of colon, unspecified: Secondary | ICD-10-CM

## 2021-05-18 DIAGNOSIS — C184 Malignant neoplasm of transverse colon: Secondary | ICD-10-CM | POA: Insufficient documentation

## 2021-05-18 DIAGNOSIS — Z95828 Presence of other vascular implants and grafts: Secondary | ICD-10-CM

## 2021-05-18 DIAGNOSIS — Z803 Family history of malignant neoplasm of breast: Secondary | ICD-10-CM | POA: Diagnosis not present

## 2021-05-18 DIAGNOSIS — Z85038 Personal history of other malignant neoplasm of large intestine: Secondary | ICD-10-CM

## 2021-05-18 LAB — COMPREHENSIVE METABOLIC PANEL
ALT: 40 U/L (ref 0–44)
AST: 55 U/L — ABNORMAL HIGH (ref 15–41)
Albumin: 3.7 g/dL (ref 3.5–5.0)
Alkaline Phosphatase: 48 U/L (ref 38–126)
Anion gap: 8 (ref 5–15)
BUN: 11 mg/dL (ref 8–23)
CO2: 24 mmol/L (ref 22–32)
Calcium: 9.6 mg/dL (ref 8.9–10.3)
Chloride: 104 mmol/L (ref 98–111)
Creatinine, Ser: 0.89 mg/dL (ref 0.61–1.24)
GFR, Estimated: >60 mL/min (ref >60)
Glucose, Bld: 139 mg/dL — ABNORMAL HIGH (ref 70–99)
Potassium: 3.5 mmol/L (ref 3.5–5.1)
Sodium: 136 mmol/L (ref 135–145)
Total Bilirubin: 0.8 mg/dL (ref 0.3–1.2)
Total Protein: 6.9 g/dL (ref 6.5–8.1)

## 2021-05-18 LAB — CBC WITH DIFFERENTIAL/PLATELET
Abs Immature Granulocytes: 0.01 10*3/uL (ref 0.00–0.07)
Basophils Absolute: 0.1 10*3/uL (ref 0.0–0.1)
Basophils Relative: 1 %
Eosinophils Absolute: 0.1 10*3/uL (ref 0.0–0.5)
Eosinophils Relative: 2 %
HCT: 39.6 % (ref 39.0–52.0)
Hemoglobin: 13.2 g/dL (ref 13.0–17.0)
Immature Granulocytes: 0 %
Lymphocytes Relative: 32 %
Lymphs Abs: 1.5 10*3/uL (ref 0.7–4.0)
MCH: 32.1 pg (ref 26.0–34.0)
MCHC: 33.3 g/dL (ref 30.0–36.0)
MCV: 96.4 fL (ref 80.0–100.0)
Monocytes Absolute: 0.5 10*3/uL (ref 0.1–1.0)
Monocytes Relative: 10 %
Neutro Abs: 2.5 10*3/uL (ref 1.7–7.7)
Neutrophils Relative %: 55 %
Platelets: 179 10*3/uL (ref 150–400)
RBC: 4.11 MIL/uL — ABNORMAL LOW (ref 4.22–5.81)
RDW: 14.5 % (ref 11.5–15.5)
WBC: 4.7 10*3/uL (ref 4.0–10.5)
nRBC: 0 % (ref 0.0–0.2)

## 2021-05-18 LAB — PROTEIN, URINE, RANDOM: Total Protein, Urine: 25 mg/dL

## 2021-05-18 MED ORDER — HEPARIN SOD (PORK) LOCK FLUSH 100 UNIT/ML IV SOLN
INTRAVENOUS | Status: AC
  Administered 2021-05-18: 500 [IU] via INTRAVENOUS
  Filled 2021-05-18: qty 5

## 2021-05-18 MED ORDER — HEPARIN SOD (PORK) LOCK FLUSH 100 UNIT/ML IV SOLN
500.0000 [IU] | Freq: Once | INTRAVENOUS | Status: AC
  Filled 2021-05-18: qty 5

## 2021-05-18 MED ORDER — SODIUM CHLORIDE 0.9 % IV SOLN
Freq: Once | INTRAVENOUS | Status: AC
  Filled 2021-05-18: qty 250

## 2021-05-18 MED ORDER — SODIUM CHLORIDE 0.9% FLUSH
10.0000 mL | Freq: Once | INTRAVENOUS | Status: AC
  Administered 2021-05-18: 10 mL via INTRAVENOUS
  Filled 2021-05-18: qty 10

## 2021-05-18 NOTE — Progress Notes (Signed)
Hematology Oncology Progress Note   Clinic Day: 05/18/2021   Referring physician: Tracie Harrier, MD  Chief Complaint: Jared Gutierrez is a 81 y.o. male presents for follow-up of metastatic colon cancer   PERTINENT ONCOLOGY HISTORY CARNELL BEAVERS is a 81 y.o.[place] who has above oncology history reviewed by me today presented for follow up visit for management of  Metastatic colon cancer. [name], patient switched care to me on 11/11/20 Extensive medical record review was performed by me  # Metastatic colon cancer.  he was diagnosed with stage I colon cancer s/p transverse colectomy on 10/17/2013.  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  Tumor extended into the submucosa. Margins were negative. + lymphovascular invasion. 14 lymph nodes were negative. Pathologic stage was T1 N0.   10/02/2014 Colonoscopy showed several 3 mm polyps and an 8 mm polyp in the cecum and transverse colon.  Pathology revealed tubular adenomas negative for high-grade dysplasia and malignancy.  02/16/2017 Colonoscopy  revealed 2 diminutive polyps in the descending colon.  Pathology revealed tubular adenomas without dysplasia or malignancy.  Her CEA has been monitored and was noted to increase starting July 2020. 04/02/2019  PET scan  limited evealed a 2.4 x 2.3 cm (SUV 11) hypermetabolic soft tissue density caudal and anterior to the pancreatic neck favored to represent isolated peritoneal or nodal metastasis in the setting of prior transverse colonic resection (expected primary drainage). Although this was immediately adjacent to the pancreas, a fat plane was maintained, arguing strongly against a pancreatic primary. Otherwise, there was no evidence of hypermetabolic metastasis.   04/17/2019 EUS on  revealed a normal esophagus, stomach, duodenum, and pancreas.  There was a  2.4 x 2.4 cm irregular mass in the retroperitoneum adjacent to, but not involving the pancreatic neck.  FNA and core needle biopsy were performed.  Pathology revealed adenocarcinoma compatible with a metastatic lesion of colorectal origin.  Tumor cells were positive for CK20 and CDX2 and negative for CK7.   NGS: [employer] on 05/15/2019 revealed + KRASG13D and TP53.  Negative results included BRAF V600E, Her2, NRAS, NTRK, PD-L1 (<1%), and TMB 8.7/Mb (intermediate).  MMR testing from his colon resection on 10/16/2013 was intact with a low probability of MSI-H.  06/25/2019, CEA 17.8. 07/09/2019 - 08/27/2019; 09/17/2019 - 10/15/2019; 11/12/2019 - 12/17/2019 He received 11 cycles of FOLFOX chemotherapy and 1 cycle of 5-FU and leucovorin (12/31/2019).  He received Neulasta after cycle #4 and #5 secondary to progressive leukopenia.  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  He received a truncated course of FOLFOX with cycle #11 secondary to oxaliplatin reaction.  08/27/2019, CEA 6.1 10/29/2019, CEA 7.7 12/31/2019, CEA 10.8.  01/14/2020 PET revealed an interval decrease in size and FDG uptake (2.4 x 2.3 cm with SUV 11 to 2.4 x 1.7 cm with SUV 4.09) associated with the previously referenced soft tissue density caudal and anterior to the pancreatic neck suggesting treatment response. There were no new sites of FDG avid tumor.  Radiation treatment 02/10/2020 - 02/23/2020.   He received a hybrid 3-dimensional course of 3000 cGy in 10 fractions to the residual FDG avid mass   05/27/2020, CEA 11.6 05/27/2020 Abdomen and pelvis CT :stable to minimally increased (2.9 x 1.8 cm compared to 2.4 x 1.7 cm) since 01/14/2020.  There were no new interval findings.    08/31/2020, CEA 30.9. 08/31/2020 Abdomen and pelvis CT revealed increased size  of the index soft tissue lesion inferior and anterior to the pancreatic neck (2.9 x 1.8 cm to 3.5 x 2.9 cm). There were similar prominent retroperitoneal lymph nodes without  adenopathy by size criteria. There was no new or enlarging abdominal or pelvic lymph nodes. There were no new interval findings. There was similar circumferential wall thickening of a nondistended urinary bladder, which likely accentuated wall thickening There was hepatic steatosis and aortic atherosclerosis.  11/03/2020, PET showed recurrent peritoneal metastasis in the upper abdomen adjacent to the pancreas.  The lesion is 3.8 x 2.9 cm with SUV of 11.3. No evidence of metastatic peritoneal disease elsewhere in the abdomen pelvis.  No evidence of distant metastasis.  Other medical problems Chronic lower extremity edema. 12/24/2018, right lower extremity duplex negative for DVT.  Small right Baker's cyst. 08/16/2019, bilateral lower extremity duplex showed no DVT.  08/16/2019 - 08/17/2019 with right lower extremity cellulitis.  He was unable to bear weight.  He was treated with IV fluids, NSAIDs, colchicine, and broad antibiotics (vancomycin and Cefepime).  He was discharged on indomethacin x 5 days and Keflex 500 mg TID x 5 days.  10/05/2020, colonoscopy showed internal hemorrhoids.  Otherwise normal examination. 11/03/2020 PET scan showed recurrent peritoneal metastasis near pancreas. Given that he has no other distant metastasis.  Repeat SBRT may be considered if he does not respond well to systemic chemotherapy.  Discussed with radiation oncology.  11/26/2020 patient was started on 5-FU and bevacizumab. 12/14/2020 CEA 29.5 02/17/2021 CT abdomen pelvis showed partial response. Mild decrease of peritoneal lesion.  Proceed with 5-FU and bevacizumab today.  Given that he is tolerating current regimen with good life quality, partial response.  Shared decision was made to hold off adding additional agents.  INTERVAL HISTORY Jared Gutierrez is a 81 y.o. male who has above history reviewed by me today presents for follow up visit for management of recurrent metastatic colon cancer Problems and complaints  are listed below: He was accompanied by wife BP is low today, he denies dizziness, fever, nausea, vomiting, dysuria. He mentioned that his bowel movement has been soft or a bit loose. No watery diarrhea.   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  Unchanged.  Not on gabapentin.   Review of Systems  Constitutional:  Negative for appetite change, chills, diaphoresis, fever and unexpected weight change.  HENT:   Negative for hearing loss, nosebleeds, sore throat and tinnitus.   Respiratory:  Positive for shortness of breath. Negative for cough and hemoptysis.   Cardiovascular:  Positive for leg swelling. Negative for chest pain and palpitations.  Gastrointestinal:  Negative for abdominal pain, blood in stool, constipation, diarrhea, nausea and vomiting.  Genitourinary:  Negative for dysuria, frequency and hematuria.   Musculoskeletal:  Positive for arthralgias. Negative for back pain, myalgias and neck pain.  Skin:  Negative for itching and rash.  Neurological:  Negative for dizziness and headaches.  Hematological:  Does not bruise/bleed easily.  Psychiatric/Behavioral:  Negative for depression. The patient is not nervous/anxious.      Past Medical History:  Diagnosis Date   Arthritis    OSTEOARTHRITIS   Cancer ([place])    Cavitary lesion of lung    RIGHT LOWER LOBE   Chicken pox    Colon cancer ([facility])    History of kidney stones    Hypertension    Lipoma of colon    Nephrolithiasis    Nephrolithiasis    Obesity    Shingles    Tubular  adenoma of colon    multiple fragments    Past Surgical History:  Procedure Laterality Date   COLON SURGERY     COLONOSCOPY N/A 10/02/2014   Procedure: COLONOSCOPY;  Surgeon: Josefine Class, MD;  Location: [facility] ENDOSCOPY;  Service: Endoscopy;  Laterality: N/A;   COLONOSCOPY WITH PROPOFOL N/A 02/16/2017   Procedure: COLONOSCOPY WITH PROPOFOL;  Surgeon: Manya Silvas, MD;  Location: [facility] ENDOSCOPY;  Service:  Endoscopy;  Laterality: N/A;   COLONOSCOPY WITH PROPOFOL N/A 10/05/2020   Procedure: COLONOSCOPY WITH PROPOFOL;  Surgeon: Lesly Rubenstein, MD;  Location: [facility] ENDOSCOPY;  Service: Endoscopy;  Laterality: N/A;   EUS N/A 04/17/2019   Procedure: FULL UPPER ENDOSCOPIC ULTRASOUND (EUS) RADIAL;  Surgeon: Holly Bodily, MD;  Location[facility] ENDOSCOPY;  Service: Gastroenterology;  Laterality: N/A;   KIDNEY STONE SURGERY     PARTIAL COLECTOMY  10/17/2013   PORTACATH PLACEMENT Right 06/13/2019   Procedure: INSERTION PORT-A-CATH;  Surgeon: Benjamine Sprague, DO;  Location: [facility] ORS;  Service: General;  Laterality: Right;    Family History  Problem Relation Age of Onset   Cancer Mother    Breast cancer Mother    COPD Father     Social History:  reports that he has never smoked. He has never used smokeless tobacco. He reports current alcohol use. He reports that he does not use drugs. 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 He is married and his wife's name is [name] [phone]).    Allergies: No Known Allergies  Current Medications: Current Outpatient Medications  Medication Sig Dispense Refill   acetaminophen (TYLENOL) 500 MG tablet Take 500 mg by mouth every 6 (six) hours as needed.     amLODipine (NORVASC) 2.5 MG tablet Take 2.5 mg by mouth daily.     aspirin EC 81 MG tablet Take 81 mg by mouth daily.     Cholecalciferol (VITAMIN D) 125 MCG (5000 UT) CAPS Take 5,000 mg by mouth.     docusate sodium (COLACE) 100 MG capsule Take 100 mg by mouth 2 (two) times daily.     gabapentin (NEURONTIN) 100 MG capsule Take 1 capsule (100 mg total) by mouth 3 (three) times daily. 90 capsule 0   HYDROcodone-acetaminophen (NORCO/VICODIN) 5-325 MG tablet Take 1 tablet by mouth every 6 (six) hours as needed for moderate pain (up to 3 doses for moderate pain.).     lidocaine-prilocaine (EMLA) cream Apply to affected area once 30 g 3   loratadine (CLARITIN) 10 MG tablet Take 10  mg by mouth daily.     olmesartan (BENICAR) 20 MG tablet Take 1 tablet by mouth daily.     ondansetron (ZOFRAN) 8 MG tablet Take 1 tablet (8 mg total) by mouth 2 (two) times daily as needed for refractory nausea / vomiting. Start on day 3 after chemotherapy. 60 tablet 1   potassium chloride SA (KLOR-CON M) 20 MEQ tablet Take 1 tablet (20 mEq total) by mouth 2 (two) times daily. 60 tablet 0   Probiotic Product (PROBIOTIC DAILY PO) Take 1 tablet by mouth daily.     prochlorperazine (COMPAZINE) 10 MG tablet Take 1 tablet (10 mg total) by mouth every 6 (six) hours as needed (NAUSEA). 30 tablet 1   NEOMYCIN-POLYMYXIN-HYDROCORTISONE (CORTISPORIN) 1 % SOLN OTIC solution SMARTSIG:Left Ear (Patient not taking: Reported on 05/03/2021)     No current facility-administered medications for this visit.   Facility-Administered Medications Ordered in Other Visits  Medication Dose  Route Frequency Provider Last Rate Last Admin   0.9 %  sodium chloride infusion   Intravenous Once Corcoran, Melissa C, MD       0.9 %  sodium chloride infusion   Intravenous Continuous Lequita Asal, MD 10 mL/hr at 12/31/19 1000 New Bag at 10/05/20 1045   heparin lock flush 100 unit/mL  500 Units Intravenous Once Lequita Asal, MD        Performance status (ECOG): 1  Vitals Blood pressure (!) 93/59, pulse 87, temperature 97.6 F (36.4 C), temperature source Tympanic, weight 269 lb 1.6 oz (122.1 kg).   Physical Exam Vitals and nursing note reviewed.  Constitutional:      General: He is not in acute distress.    Appearance: He is well-developed. He is obese. He is not diaphoretic.     Interventions: Face mask in place.     Comments: Patient walks with a cane  HENT:     Head: Normocephalic and atraumatic.     Mouth/Throat:     Mouth: Mucous membranes are moist.     Pharynx: Oropharynx is clear.  Eyes:     General: No scleral icterus.    Extraocular Movements: Extraocular movements intact.      Conjunctiva/sclera: Conjunctivae normal.     Pupils: Pupils are equal, round, and reactive to light.  Cardiovascular:     Rate and Rhythm: Normal rate and regular rhythm.     Heart sounds: Normal heart sounds. No murmur heard. Pulmonary:     Effort: Pulmonary effort is normal. No respiratory distress.     Breath sounds: No wheezing or rales.     Comments: Decreased breath sound bilaterally Chest:     Chest wall: No tenderness.  Abdominal:     General: Bowel sounds are normal. There is no distension.     Palpations: Abdomen is soft. There is no mass.     Tenderness: There is no abdominal tenderness. There is no guarding or rebound.  Musculoskeletal:        General: No swelling or tenderness. Normal range of motion.     Cervical back: Normal range of motion and neck supple.     Right lower leg: Edema present.     Left lower leg: Edema present.  Lymphadenopathy:     Head:     Right side of head: No preauricular, posterior auricular or occipital adenopathy.     Left side of head: No preauricular, posterior auricular or occipital adenopathy.     Cervical: No cervical adenopathy.     Upper Body:     Right upper body: No supraclavicular or axillary adenopathy.     Left upper body: No supraclavicular or axillary adenopathy.     Lower Body: No right inguinal adenopathy. No left inguinal adenopathy.  Skin:    General: Skin is warm and dry.  Neurological:     General: No focal deficit present.     Mental Status: He is alert and oriented to person, place, and time. Mental status is at baseline.  Psychiatric:        Mood and Affect: Mood normal.   Infusion on 05/18/2021  Component Date Value Ref Range Status   Sodium 05/18/2021 136  135 - 145 mmol/L Final   Potassium 05/18/2021 3.5  3.5 - 5.1 mmol/L Final   Chloride 05/18/2021 104  98 - 111 mmol/L Final   CO2 05/18/2021 24  22 - 32 mmol/L Final   Glucose, Bld 05/18/2021 139 (H)  70 -  99 mg/dL Final   Glucose reference range applies  only to samples taken after fasting for at least 8 hours.   BUN 05/18/2021 11  8 - 23 mg/dL Final   Creatinine, Ser 05/18/2021 0.89  0.61 - 1.24 mg/dL Final   Calcium 05/18/2021 9.6  8.9 - 10.3 mg/dL Final   Total Protein 05/18/2021 6.9  6.5 - 8.1 g/dL Final   Albumin 05/18/2021 3.7  3.5 - 5.0 g/dL Final   AST 05/18/2021 55 (H)  15 - 41 U/L Final   ALT 05/18/2021 40  0 - 44 U/L Final   Alkaline Phosphatase 05/18/2021 48  38 - 126 U/L Final   Total Bilirubin 05/18/2021 0.8  0.3 - 1.2 mg/dL Final   GFR, Estimated 05/18/2021 >60  >60 mL/min Final   Comment: (NOTE) Calculated using the CKD-EPI Creatinine Equation (2021)    Anion gap 05/18/2021 8  5 - 15 Final   Performed at [facility], [address] [place], [place] [address]   Total Protein, Urine 05/18/2021 25  mg/dL Final   Comment: NO NORMAL RANGE ESTABLISHED FOR THIS TEST Performed at [facility], [address] [place], [place] [address]    WBC 05/18/2021 4.7  4.0 - 10.5 K/uL Final   RBC 05/18/2021 4.11 (L)  4.22 - 5.81 MIL/uL Final   Hemoglobin 05/18/2021 13.2  13.0 - 17.0 g/dL Final   HCT 05/18/2021 39.6  39.0 - 52.0 % Final   MCV 05/18/2021 96.4  80.0 - 100.0 fL Final   MCH 05/18/2021 32.1  26.0 - 34.0 pg Final   MCHC 05/18/2021 33.3  30.0 - 36.0 g/dL Final   RDW 05/18/2021 14.5  11.5 - 15.5 % Final   Platelets 05/18/2021 179  150 - 400 K/uL Final   nRBC 05/18/2021 0.0  0.0 - 0.2 % Final   Neutrophils Relative % 05/18/2021 55  % Final   Neutro Abs 05/18/2021 2.5  1.7 - 7.7 K/uL Final   Lymphocytes Relative 05/18/2021 32  % Final   Lymphs Abs 05/18/2021 1.5  0.7 - 4.0 K/uL Final   Monocytes Relative 05/18/2021 10  % Final   Monocytes Absolute 05/18/2021 0.5  0.1 - 1.0 K/uL Final   Eosinophils Relative 05/18/2021 2  % Final   Eosinophils Absolute 05/18/2021 0.1  0.0 - 0.5 K/uL Final   Basophils Relative 05/18/2021 1  % Final   Basophils Absolute 05/18/2021 0.1  0.0 - 0.1 K/uL Final   Immature  Granulocytes 05/18/2021 0  % Final   Abs Immature Granulocytes 05/18/2021 0.01  0.00 - 0.07 K/uL Final   Performed at [facility], [address] [place],  [address]    Assessment and plan 1. 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 Encounter for antineoplastic chemotherapy   3. Anemia due to chemotherapy   4. Hypotension due to hypovolemia    #Metastatic colon cancer-recurrent Overall he tolerates chemotherapy.  CEA continues to trend down Hold chemotherapy due to hypotension.    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  Patient has gabapentin supply at home.Patient declines gabapentin and acupuncture. Symptoms are manageable.   #Hypotension, recommend patient to hold off BP med until his next visit. Encourage him to measure BP at home.  IV NS 516m x 1 today. Encourage oral hydration.   #Elevated glucose level.  A1c is normal.  Stable.  . Follow-up 1 week for -re-evaluation of  lab MD 5-FU/bevacizumab -   I discussed  the assessment and treatment plan with the patient.  The patient was provided an opportunity to ask questions and all were answered.  The patient agreed with the plan and demonstrated an understanding of the instructions.  The patient was advised to call back if the symptoms worsen or if the condition fails to improve as anticipated.  [name], MD, PhD 05/18/2021

## 2021-05-19 LAB — CEA: CEA: 10.8 ng/mL — ABNORMAL HIGH (ref 0.0–4.7)

## 2021-05-20 ENCOUNTER — Inpatient Hospital Stay: Payer: Medicare Other

## 2021-05-27 ENCOUNTER — Inpatient Hospital Stay: Payer: Medicare Other

## 2021-05-27 ENCOUNTER — Other Ambulatory Visit: Payer: Self-pay

## 2021-05-27 ENCOUNTER — Inpatient Hospital Stay (HOSPITAL_BASED_OUTPATIENT_CLINIC_OR_DEPARTMENT_OTHER): Payer: Medicare Other | Admitting: Oncology

## 2021-05-27 ENCOUNTER — Encounter: Payer: Self-pay | Admitting: Oncology

## 2021-05-27 VITALS — BP 145/77 | HR 63 | Temp 97.2°F | Resp 18 | Wt 262.0 lb

## 2021-05-27 DIAGNOSIS — G620 Drug-induced polyneuropathy: Secondary | ICD-10-CM | POA: Diagnosis not present

## 2021-05-27 DIAGNOSIS — Z86010 Personal history of colonic polyps: Secondary | ICD-10-CM

## 2021-05-27 DIAGNOSIS — C184 Malignant neoplasm of transverse colon: Secondary | ICD-10-CM | POA: Diagnosis not present

## 2021-05-27 DIAGNOSIS — D6481 Anemia due to antineoplastic chemotherapy: Secondary | ICD-10-CM

## 2021-05-27 DIAGNOSIS — Z836 Family history of other diseases of the respiratory system: Secondary | ICD-10-CM

## 2021-05-27 DIAGNOSIS — C786 Secondary malignant neoplasm of retroperitoneum and peritoneum: Secondary | ICD-10-CM

## 2021-05-27 DIAGNOSIS — T451X5D Adverse effect of antineoplastic and immunosuppressive drugs, subsequent encounter: Secondary | ICD-10-CM

## 2021-05-27 DIAGNOSIS — R2243 Localized swelling, mass and lump, lower limb, bilateral: Secondary | ICD-10-CM

## 2021-05-27 DIAGNOSIS — Z809 Family history of malignant neoplasm, unspecified: Secondary | ICD-10-CM

## 2021-05-27 DIAGNOSIS — Z5112 Encounter for antineoplastic immunotherapy: Secondary | ICD-10-CM | POA: Diagnosis not present

## 2021-05-27 DIAGNOSIS — C189 Malignant neoplasm of colon, unspecified: Secondary | ICD-10-CM

## 2021-05-27 DIAGNOSIS — Z803 Family history of malignant neoplasm of breast: Secondary | ICD-10-CM

## 2021-05-27 DIAGNOSIS — Z5111 Encounter for antineoplastic chemotherapy: Secondary | ICD-10-CM

## 2021-05-27 DIAGNOSIS — Z85038 Personal history of other malignant neoplasm of large intestine: Secondary | ICD-10-CM

## 2021-05-27 LAB — CBC WITH DIFFERENTIAL/PLATELET
Abs Immature Granulocytes: 0.04 10*3/uL (ref 0.00–0.07)
Basophils Absolute: 0.1 10*3/uL (ref 0.0–0.1)
Basophils Relative: 1 %
Eosinophils Absolute: 0.1 10*3/uL (ref 0.0–0.5)
Eosinophils Relative: 3 %
HCT: 39.8 % (ref 39.0–52.0)
Hemoglobin: 13.3 g/dL (ref 13.0–17.0)
Immature Granulocytes: 1 %
Lymphocytes Relative: 27 %
Lymphs Abs: 1.3 10*3/uL (ref 0.7–4.0)
MCH: 32.2 pg (ref 26.0–34.0)
MCHC: 33.4 g/dL (ref 30.0–36.0)
MCV: 96.4 fL (ref 80.0–100.0)
Monocytes Absolute: 0.6 10*3/uL (ref 0.1–1.0)
Monocytes Relative: 13 %
Neutro Abs: 2.6 10*3/uL (ref 1.7–7.7)
Neutrophils Relative %: 55 %
Platelets: 209 10*3/uL (ref 150–400)
RBC: 4.13 MIL/uL — ABNORMAL LOW (ref 4.22–5.81)
RDW: 14.3 % (ref 11.5–15.5)
WBC: 4.8 10*3/uL (ref 4.0–10.5)
nRBC: 0 % (ref 0.0–0.2)

## 2021-05-27 LAB — COMPREHENSIVE METABOLIC PANEL
ALT: 35 U/L (ref 0–44)
AST: 46 U/L — ABNORMAL HIGH (ref 15–41)
Albumin: 3.8 g/dL (ref 3.5–5.0)
Alkaline Phosphatase: 46 U/L (ref 38–126)
Anion gap: 7 (ref 5–15)
BUN: 10 mg/dL (ref 8–23)
CO2: 24 mmol/L (ref 22–32)
Calcium: 9.1 mg/dL (ref 8.9–10.3)
Chloride: 103 mmol/L (ref 98–111)
Creatinine, Ser: 0.64 mg/dL (ref 0.61–1.24)
GFR, Estimated: >60 mL/min (ref >60)
Glucose, Bld: 112 mg/dL — ABNORMAL HIGH (ref 70–99)
Potassium: 3.8 mmol/L (ref 3.5–5.1)
Sodium: 134 mmol/L — ABNORMAL LOW (ref 135–145)
Total Bilirubin: 0.8 mg/dL (ref 0.3–1.2)
Total Protein: 6.9 g/dL (ref 6.5–8.1)

## 2021-05-27 MED ORDER — SODIUM CHLORIDE 0.9 % IV SOLN
2400.0000 mg/m2 | INTRAVENOUS | Status: DC
  Administered 2021-05-27: 5700 mg via INTRAVENOUS
  Filled 2021-05-27: qty 114

## 2021-05-27 MED ORDER — SODIUM CHLORIDE 0.9 % IV SOLN
Freq: Once | INTRAVENOUS | Status: AC
  Filled 2021-05-27: qty 250

## 2021-05-27 MED ORDER — SODIUM CHLORIDE 0.9 % IV SOLN
10.0000 mg | Freq: Once | INTRAVENOUS | Status: AC
  Administered 2021-05-27: 10 mg via INTRAVENOUS
  Filled 2021-05-27: qty 10

## 2021-05-27 MED ORDER — SODIUM CHLORIDE 0.9 % IV SOLN
950.0000 mg | Freq: Once | INTRAVENOUS | Status: AC
  Administered 2021-05-27: 950 mg via INTRAVENOUS
  Filled 2021-05-27: qty 47.5

## 2021-05-27 MED ORDER — SODIUM CHLORIDE 0.9 % IV SOLN
5.0000 mg/kg | Freq: Once | INTRAVENOUS | Status: AC
  Administered 2021-05-27: 600 mg via INTRAVENOUS
  Filled 2021-05-27: qty 16

## 2021-05-27 MED ORDER — FLUOROURACIL CHEMO INJECTION 2.5 GM/50ML
400.0000 mg/m2 | Freq: Once | INTRAVENOUS | Status: AC
  Administered 2021-05-27: 950 mg via INTRAVENOUS
  Filled 2021-05-27: qty 19

## 2021-05-27 NOTE — Patient Instructions (Addendum)
[facility] AT [place]   Discharge Instructions: Thank you for choosing [facility] to provide your oncology and hematology care.  If you have a lab appointment with the [facility], please go directly to the [facility] and check in at the registration area.  Wear comfortable clothing and clothing appropriate for easy access to any Portacath or PICC line.   We strive to give you quality time with your provider. You may need to reschedule your appointment if you arrive late (15 or more minutes).  Arriving late affects you and other patients whose appointments are after yours.  Also, if you miss three or more appointments without notifying the office, you may be dismissed from the clinic at the providers discretion.      For prescription refill requests, have your pharmacy contact our office and allow 72 hours for refills to be completed.    Today you received the following chemotherapy and/or immunotherapy agents - Bevacizumab, Adrucil       To help prevent nausea and vomiting after your treatment, we encourage you to take your nausea medication as directed.  BELOW ARE SYMPTOMS THAT SHOULD BE REPORTED IMMEDIATELY: *FEVER GREATER THAN 100.4 F (38 C) OR HIGHER *CHILLS OR SWEATING *NAUSEA AND VOMITING THAT IS NOT CONTROLLED WITH YOUR NAUSEA MEDICATION *UNUSUAL SHORTNESS OF BREATH *UNUSUAL BRUISING OR BLEEDING *URINARY PROBLEMS (pain or burning when urinating, or frequent urination) *BOWEL PROBLEMS (unusual diarrhea, constipation, pain near the anus) TENDERNESS IN MOUTH AND THROAT WITH OR WITHOUT PRESENCE OF ULCERS (sore throat, sores in mouth, or a toothache) UNUSUAL RASH, SWELLING OR PAIN  UNUSUAL VAGINAL DISCHARGE OR ITCHING   Items with * indicate a potential emergency and should be followed up as soon as possible or go to the Emergency Department if any problems should occur.  Please show the CHEMOTHERAPY ALERT CARD or IMMUNOTHERAPY ALERT CARD  at check-in to the Emergency Department and triage nurse.  Should you have questions after your visit or need to cancel or reschedule your appointment, please contact [facility] CANCER [place] AT [place]  [phone] and follow the prompts.  Office hours are 8:00 a.m. to 4:30 p.m. Monday - Friday. Please note that voicemails left after 4:00 p.m. may not be returned until the following business day.  We are closed weekends and major holidays. You have access to a nurse at all times for urgent questions. Please call the main number to the clinic [phone] and follow the prompts.  For any non-urgent questions, you may also contact your provider using MyChart. We now offer e-Visits for anyone 53 and older to request care online for non-urgent symptoms. For details visit [email or web address].   Also download the MyChart app! Go to the app store, search "MyChart", open the app, select [facility], and log in with your MyChart username and password.  Dexamethasone injection What is this medication? DEXAMETHASONE (dex a METH a sone) is a corticosteroid. It is used to treat inflammation of the skin, joints, lungs, and other organs. Common conditions treated include asthma, allergies, and arthritis. It is also used for other conditions, like blood disorders and diseases of the adrenal glands. This medicine may be used for other purposes; ask your health care provider or pharmacist if you have questions. COMMON BRAND NAME(S): Decadron, DoubleDex, ReadySharp Dexamethasone, Simplist Dexamethasone, Solurex What should I tell my care team before I take this medication? They need to know if you have any of these conditions: Cushing's syndrome diabetes glaucoma heart disease high  blood pressure infection like herpes, measles, tuberculosis, or chickenpox kidney disease liver disease mental illness myasthenia gravis osteoporosis previous heart attack seizures stomach or intestine  problems thyroid disease an unusual or allergic reaction to dexamethasone, corticosteroids, other medicines, lactose, foods, dyes, or preservatives pregnant or trying to get pregnant breast-feeding How should I use this medication? This medicine is for injection into a muscle, joint, lesion, soft tissue, or vein. It is given by a health care professional in a hospital or clinic setting. Talk to your pediatrician regarding the use of this medicine in children. Special care may be needed. Overdosage: If you think you have taken too much of this medicine contact a poison control center or emergency room at once. NOTE: This medicine is only for you. Do not share this medicine with others. What if I miss a dose? This may not apply. If you are having a series of injections over a prolonged period, try not to miss an appointment. Call your doctor or health care professional to reschedule if you are unable to keep an appointment. What may interact with this medication? Do not take this medicine with any of the following medications: live virus vaccines This medicine may also interact with the following medications: aminoglutethimide amphotericin B aspirin and aspirin-like medicines certain antibiotics like erythromycin, clarithromycin, and troleandomycin certain antivirals for HIV or hepatitis certain medicines for seizures like carbamazepine, phenobarbital, phenytoin certain medicines to treat myasthenia gravis cholestyramine cyclosporine digoxin diuretics ephedrine male hormones, like estrogen or progestins and birth control pills insulin or other medicines for diabetes isoniazid ketoconazole medicines that relax muscles for surgery mifepristone NSAIDs, medicines for pain and inflammation, like ibuprofen or naproxen rifampin skin tests for allergies thalidomide vaccines warfarin This list may not describe all possible interactions. Give your health care provider a list of all the  medicines, herbs, non-prescription drugs, or dietary supplements you use. Also tell them if you smoke, drink alcohol, or use illegal drugs. Some items may interact with your medicine. What should I watch for while using this medication? Visit your health care professional for regular checks on your progress. Tell your health care professional if your symptoms do not start to get better or if they get worse. Your condition will be monitored carefully while you are receiving this medicine. Wear a medical ID bracelet or chain. Carry a card that describes your disease and details of your medicine and dosage times. This medicine may increase your risk of getting an infection. Call your health care professional for advice if you get a fever, chills, or sore throat, or other symptoms of a cold or flu. Do not treat yourself. Try to avoid being around people who are sick. Call your health care professional if you are around anyone with measles, chickenpox, or if you develop sores or blisters that do not heal properly. If you are going to need surgery or other procedures, tell your doctor or health care professional that you have taken this medicine within the last 12 months. Ask your doctor or health care professional about your diet. You may need to lower the amount of salt you eat. This medicine may increase blood sugar. Ask your healthcare provider if changes in diet or medicines are needed if you have diabetes. What side effects may I notice from receiving this medication? Side effects that you should report to your doctor or health care professional as soon as possible: allergic reactions like skin rash, itching or hives, swelling of the face, lips, or tongue bloody  or black, tarry stools changes in emotions or moods changes in vision confusion, excitement, restlessness depressed mood eye pain hallucinations muscle weakness severe or sudden stomach or belly pain signs and symptoms of high blood sugar  such as being more thirsty or hungry or having to urinate more than normal. You may also feel very tired or have blurry vision. signs and symptoms of infection like fever; chills; cough; sore throat; pain or trouble passing urine swelling of ankles, feet unusual bruising or bleeding wounds that do not heal Side effects that usually do not require medical attention (report to your doctor or health care professional if they continue or are bothersome): increased appetite increased growth of face or body hair headache nausea, vomiting pain, redness, or irritation at site where injected skin problems, acne, thin and shiny skin trouble sleeping weight gain This list may not describe all possible side effects. Call your doctor for medical advice about side effects. You may report side effects to FDA at 1-800-FDA-1088. Where should I keep my medication? This medicine is given in a hospital or clinic and will not be stored at home. NOTE: This sheet is a summary. It may not cover all possible information. If you have questions about this medicine, talk to your doctor, pharmacist, or health care provider.  2022 Elsevier/Gold Standard (2018-11-14 00:00:00)   Due to Covid, a mask is required upon entering the hospital/clinic. If you do not have a mask, one will be given to you upon arrival. For doctor visits, patients may have 1 support person aged 69 or older with them. For treatment visits, patients cannot have anyone with them due to current Covid guidelines and our immunocompromised population.   Bevacizumab injection What is this medication? BEVACIZUMAB (be va SIZ yoo mab) is a monoclonal antibody. It is used to treat many types of cancer. This medicine may be used for other purposes; ask your health care provider or pharmacist if you have questions. COMMON BRAND NAME(S): Alymsys, Avastin, MVASI, Noah Charon What should I tell my care team before I take this medication? They need to know if you  have any of these conditions: diabetes heart disease high blood pressure history of coughing up blood prior anthracycline chemotherapy (e.g., doxorubicin, daunorubicin, epirubicin) recent or ongoing radiation therapy recent or planning to have surgery stroke an unusual or allergic reaction to bevacizumab, hamster proteins, mouse proteins, other medicines, foods, dyes, or preservatives pregnant or trying to get pregnant breast-feeding How should I use this medication? This medicine is for infusion into a vein. It is given by a health care professional in a hospital or clinic setting. Talk to your pediatrician regarding the use of this medicine in children. Special care may be needed. Overdosage: If you think you have taken too much of this medicine contact a poison control center or emergency room at once. NOTE: This medicine is only for you. Do not share this medicine with others. What if I miss a dose? It is important not to miss your dose. Call your doctor or health care professional if you are unable to keep an appointment. What may interact with this medication? Interactions are not expected. This list may not describe all possible interactions. Give your health care provider a list of all the medicines, herbs, non-prescription drugs, or dietary supplements you use. Also tell them if you smoke, drink alcohol, or use illegal drugs. Some items may interact with your medicine. What should I watch for while using this medication? Your condition will be monitored  carefully while you are receiving this medicine. You will need important blood work and urine testing done while you are taking this medicine. This medicine may increase your risk to bruise or bleed. Call your doctor or health care professional if you notice any unusual bleeding. Before having surgery, talk to your health care provider to make sure it is ok. This drug can increase the risk of poor healing of your surgical site or  wound. You will need to stop this drug for 28 days before surgery. After surgery, wait at least 28 days before restarting this drug. Make sure the surgical site or wound is healed enough before restarting this drug. Talk to your health care provider if questions. Do not become pregnant while taking this medicine or for 6 months after stopping it. Women should inform their doctor if they wish to become pregnant or think they might be pregnant. There is a potential for serious side effects to an unborn child. Talk to your health care professional or pharmacist for more information. Do not breast-feed an infant while taking this medicine and for 6 months after the last dose. This medicine has caused ovarian failure in some women. This medicine may interfere with the ability to have a child. You should talk to your doctor or health care professional if you are concerned about your fertility. What side effects may I notice from receiving this medication? Side effects that you should report to your doctor or health care professional as soon as possible: allergic reactions like skin rash, itching or hives, swelling of the face, lips, or tongue chest pain or chest tightness chills coughing up blood high fever seizures severe constipation signs and symptoms of bleeding such as bloody or black, tarry stools; red or dark-brown urine; spitting up blood or brown material that looks like coffee grounds; red spots on the skin; unusual bruising or bleeding from the eye, gums, or nose signs and symptoms of a blood clot such as breathing problems; chest pain; severe, sudden headache; pain, swelling, warmth in the leg signs and symptoms of a stroke like changes in vision; confusion; trouble speaking or understanding; severe headaches; sudden numbness or weakness of the face, arm or leg; trouble walking; dizziness; loss of balance or coordination stomach pain sweating swelling of legs or ankles vomiting weight  gain Side effects that usually do not require medical attention (report to your doctor or health care professional if they continue or are bothersome): back pain changes in taste decreased appetite dry skin nausea tiredness This list may not describe all possible side effects. Call your doctor for medical advice about side effects. You may report side effects to FDA at 1-800-FDA-1088. Where should I keep my medication? This drug is given in a hospital or clinic and will not be stored at home. NOTE: This sheet is a summary. It may not cover all possible information. If you have questions about this medicine, talk to your doctor, pharmacist, or health care provider.  2022 Elsevier/Gold Standard (2021-01-18 00:00:00)  Fluorouracil, 5-FU injection What is this medication? FLUOROURACIL, 5-FU (flure oh YOOR a sil) is a chemotherapy drug. It slows the growth of cancer cells. This medicine is used to treat many types of cancer like breast cancer, colon or rectal cancer, pancreatic cancer, and stomach cancer. This medicine may be used for other purposes; ask your health care provider or pharmacist if you have questions. COMMON BRAND NAME(S): Adrucil What should I tell my care team before I take this medication? They  need to know if you have any of these conditions: blood disorders dihydropyrimidine dehydrogenase (DPD) deficiency infection (especially a virus infection such as chickenpox, cold sores, or herpes) kidney disease liver disease malnourished, poor nutrition recent or ongoing radiation therapy an unusual or allergic reaction to fluorouracil, other chemotherapy, other medicines, foods, dyes, or preservatives pregnant or trying to get pregnant breast-feeding How should I use this medication? This drug is given as an infusion or injection into a vein. It is administered in a hospital or clinic by a specially trained health care professional. Talk to your pediatrician regarding the use  of this medicine in children. Special care may be needed. Overdosage: If you think you have taken too much of this medicine contact a poison control center or emergency room at once. NOTE: This medicine is only for you. Do not share this medicine with others. What if I miss a dose? It is important not to miss your dose. Call your doctor or health care professional if you are unable to keep an appointment. What may interact with this medication? Do not take this medicine with any of the following medications: live virus vaccines This medicine may also interact with the following medications: medicines that treat or prevent blood clots like warfarin, enoxaparin, and dalteparin This list may not describe all possible interactions. Give your health care provider a list of all the medicines, herbs, non-prescription drugs, or dietary supplements you use. Also tell them if you smoke, drink alcohol, or use illegal drugs. Some items may interact with your medicine. What should I watch for while using this medication? Visit your doctor for checks on your progress. This drug may make you feel generally unwell. This is not uncommon, as chemotherapy can affect healthy cells as well as cancer cells. Report any side effects. Continue your course of treatment even though you feel ill unless your doctor tells you to stop. In some cases, you may be given additional medicines to help with side effects. Follow all directions for their use. Call your doctor or health care professional for advice if you get a fever, chills or sore throat, or other symptoms of a cold or flu. Do not treat yourself. This drug decreases your body's ability to fight infections. Try to avoid being around people who are sick. This medicine may increase your risk to bruise or bleed. Call your doctor or health care professional if you notice any unusual bleeding. Be careful brushing and flossing your teeth or using a toothpick because you may get  an infection or bleed more easily. If you have any dental work done, tell your dentist you are receiving this medicine. Avoid taking products that contain aspirin, acetaminophen, ibuprofen, naproxen, or ketoprofen unless instructed by your doctor. These medicines may hide a fever. Do not become pregnant while taking this medicine. Women should inform their doctor if they wish to become pregnant or think they might be pregnant. There is a potential for serious side effects to an unborn child. Talk to your health care professional or pharmacist for more information. Do not breast-feed an infant while taking this medicine. Men should inform their doctor if they wish to father a child. This medicine may lower sperm counts. Do not treat diarrhea with over the counter products. Contact your doctor if you have diarrhea that lasts more than 2 days or if it is severe and watery. This medicine can make you more sensitive to the sun. Keep out of the sun. If you cannot avoid being  in the sun, wear protective clothing and use sunscreen. Do not use sun lamps or tanning beds/booths. What side effects may I notice from receiving this medication? Side effects that you should report to your doctor or health care professional as soon as possible: allergic reactions like skin rash, itching or hives, swelling of the face, lips, or tongue low blood counts - this medicine may decrease the number of Audra Kagel blood cells, red blood cells and platelets. You may be at increased risk for infections and bleeding. signs of infection - fever or chills, cough, sore throat, pain or difficulty passing urine signs of decreased platelets or bleeding - bruising, pinpoint red spots on the skin, black, tarry stools, blood in the urine signs of decreased red blood cells - unusually weak or tired, fainting spells, lightheadedness breathing problems changes in vision chest pain mouth sores nausea and vomiting pain, swelling, redness at site  where injected pain, tingling, numbness in the hands or feet redness, swelling, or sores on hands or feet stomach pain unusual bleeding Side effects that usually do not require medical attention (report to your doctor or health care professional if they continue or are bothersome): changes in finger or toe nails diarrhea dry or itchy skin hair loss headache loss of appetite sensitivity of eyes to the light stomach upset unusually teary eyes This list may not describe all possible side effects. Call your doctor for medical advice about side effects. You may report side effects to FDA at 1-800-FDA-1088. Where should I keep my medication? This drug is given in a hospital or clinic and will not be stored at home. NOTE: This sheet is a summary. It may not cover all possible information. If you have questions about this medicine, talk to your doctor, pharmacist, or health care provider.  2022 Elsevier/Gold Standard (2021-01-18 00:00:00)

## 2021-05-27 NOTE — Progress Notes (Signed)
Patient here for follow up. No new concerns voiced.  °

## 2021-05-27 NOTE — Progress Notes (Signed)
Per Benjamine Mola, RN - Dr Tasia Catchings states ok to proceed with tx as scheduled today, using urine protein results from 1/4

## 2021-05-27 NOTE — Progress Notes (Signed)
Hematology Oncology Progress Note   Clinic Day: 05/27/2021   Referring physician: Tracie Harrier, MD  Chief Complaint: Jared Gutierrez is a 81 y.o. male presents for follow-up of metastatic colon cancer   PERTINENT ONCOLOGY HISTORY Jared Gutierrez is a 81 y.o.[place] who has above oncology history reviewed by me today presented for follow up visit for management of  Metastatic colon cancer. [name], patient switched care to me on 11/11/20 Extensive medical record review was performed by me  # Metastatic colon cancer.  he was diagnosed with stage I colon cancer s/p transverse colectomy on 10/17/2013.  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  Tumor extended into the submucosa. Margins were negative. + lymphovascular invasion. 14 lymph nodes were negative. Pathologic stage was T1 N0.   10/02/2014 Colonoscopy showed several 3 mm polyps and an 8 mm polyp in the cecum and transverse colon.  Pathology revealed tubular adenomas negative for high-grade dysplasia and malignancy.  02/16/2017 Colonoscopy  revealed 2 diminutive polyps in the descending colon.  Pathology revealed tubular adenomas without dysplasia or malignancy.  Her CEA has been monitored and was noted to increase starting July 2020. 04/02/2019  PET scan  limited evealed a 2.4 x 2.3 cm (SUV 11) hypermetabolic soft tissue density caudal and anterior to the pancreatic neck favored to represent isolated peritoneal or nodal metastasis in the setting of prior transverse colonic resection (expected primary drainage). Although this was immediately adjacent to the pancreas, a fat plane was maintained, arguing strongly against a pancreatic primary. Otherwise, there was no evidence of hypermetabolic metastasis.   04/17/2019 EUS on  revealed a normal esophagus, stomach, duodenum, and pancreas.  There was a  2.4 x 2.4 cm irregular mass in the retroperitoneum adjacent to, but not involving the pancreatic neck.  FNA and core needle biopsy were performed.  Pathology revealed adenocarcinoma compatible with a metastatic lesion of colorectal origin.  Tumor cells were positive for CK20 and CDX2 and negative for CK7.   NGS: [employer] on 05/15/2019 revealed + KRASG13D and TP53.  Negative results included BRAF V600E, Her2, NRAS, NTRK, PD-L1 (<1%), and TMB 8.7/Mb (intermediate).  MMR testing from his colon resection on 10/16/2013 was intact with a low probability of MSI-H.  06/25/2019, CEA 17.8. 07/09/2019 - 08/27/2019; 09/17/2019 - 10/15/2019; 11/12/2019 - 12/17/2019 He received 11 cycles of FOLFOX chemotherapy and 1 cycle of 5-FU and leucovorin (12/31/2019).  He received Neulasta after cycle #4 and #5 secondary to progressive leukopenia.  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  He received a truncated course of FOLFOX with cycle #11 secondary to oxaliplatin reaction.  08/27/2019, CEA 6.1 10/29/2019, CEA 7.7 12/31/2019, CEA 10.8.  01/14/2020 PET revealed an interval decrease in size and FDG uptake (2.4 x 2.3 cm with SUV 11 to 2.4 x 1.7 cm with SUV 4.09) associated with the previously referenced soft tissue density caudal and anterior to the pancreatic neck suggesting treatment response. There were no new sites of FDG avid tumor.  Radiation treatment 02/10/2020 - 02/23/2020.   He received a hybrid 3-dimensional course of 3000 cGy in 10 fractions to the residual FDG avid mass   05/27/2020, CEA 11.6 05/27/2020 Abdomen and pelvis CT :stable to minimally increased (2.9 x 1.8 cm compared to 2.4 x 1.7 cm) since 01/14/2020.  There were no new interval findings.    08/31/2020, CEA 30.9. 08/31/2020 Abdomen and pelvis CT revealed increased size of  the index soft tissue lesion inferior and anterior to the pancreatic neck (2.9 x 1.8 cm to 3.5 x 2.9 cm). There were similar prominent retroperitoneal lymph nodes without  adenopathy by size criteria. There was no new or enlarging abdominal or pelvic lymph nodes. There were no new interval findings. There was similar circumferential wall thickening of a nondistended urinary bladder, which likely accentuated wall thickening There was hepatic steatosis and aortic atherosclerosis.  11/03/2020, PET showed recurrent peritoneal metastasis in the upper abdomen adjacent to the pancreas.  The lesion is 3.8 x 2.9 cm with SUV of 11.3. No evidence of metastatic peritoneal disease elsewhere in the abdomen pelvis.  No evidence of distant metastasis.  Other medical problems Chronic lower extremity edema. 12/24/2018, right lower extremity duplex negative for DVT.  Small right Baker's cyst. 08/16/2019, bilateral lower extremity duplex showed no DVT.  08/16/2019 - 08/17/2019 with right lower extremity cellulitis.  He was unable to bear weight.  He was treated with IV fluids, NSAIDs, colchicine, and broad antibiotics (vancomycin and Cefepime).  He was discharged on indomethacin x 5 days and Keflex 500 mg TID x 5 days.  10/05/2020, colonoscopy showed internal hemorrhoids.  Otherwise normal examination. 11/03/2020 PET scan showed recurrent peritoneal metastasis near pancreas. Given that he has no other distant metastasis.  Repeat SBRT may be considered if he does not respond well to systemic chemotherapy.  Discussed with radiation oncology.  11/26/2020 patient was started on 5-FU and bevacizumab. 12/14/2020 CEA 29.5 02/17/2021 CT abdomen pelvis showed partial response. Mild decrease of peritoneal lesion.  Proceed with 5-FU and bevacizumab today.  Given that he is tolerating current regimen with good life quality, partial response.  Shared decision was made to hold off adding additional agents.  INTERVAL HISTORY Jared Gutierrez is a 81 y.o. male who has above history reviewed by me today presents for follow up visit for management of recurrent metastatic colon cancer Problems and complaints  are listed below: He was accompanied by wife Off Norvasc currently.  Blood pressure has improved.  Patient has no new complaints.   Review of Systems  Constitutional:  Negative for appetite change, chills, diaphoresis, fever and unexpected weight change.  HENT:   Negative for hearing loss, nosebleeds, sore throat and tinnitus.   Respiratory:  Negative for cough, hemoptysis and shortness of breath.   Cardiovascular:  Positive for leg swelling. Negative for chest pain and palpitations.  Gastrointestinal:  Negative for abdominal pain, blood in stool, constipation, diarrhea, nausea and vomiting.  Genitourinary:  Negative for dysuria, frequency and hematuria.   Musculoskeletal:  Positive for arthralgias. Negative for back pain, myalgias and neck pain.  Skin:  Negative for itching and rash.  Neurological:  Negative for dizziness and headaches.  Hematological:  Does not bruise/bleed easily.  Psychiatric/Behavioral:  Negative for depression. The patient is not nervous/anxious.      Past Medical History:  Diagnosis Date   Arthritis    OSTEOARTHRITIS   Cancer ([place])    Cavitary lesion of lung    RIGHT LOWER LOBE   Chicken pox    Colon cancer ([place])    History of kidney stones    Hypertension    Lipoma of colon    Nephrolithiasis    Nephrolithiasis    Obesity    Shingles    Tubular adenoma of colon    multiple fragments    Past Surgical History:  Procedure Laterality Date   COLON SURGERY     COLONOSCOPY N/A 10/02/2014   Procedure:  COLONOSCOPY;  Surgeon: Josefine Class, MD;  Location: [facility] ENDOSCOPY;  Service: Endoscopy;  Laterality: N/A;   COLONOSCOPY WITH PROPOFOL N/A 02/16/2017   Procedure: COLONOSCOPY WITH PROPOFOL;  Surgeon: Manya Silvas, MD;  Location[facility] ENDOSCOPY;  Service: Endoscopy;  Laterality: N/A;   COLONOSCOPY WITH PROPOFOL N/A 10/05/2020   Procedure: COLONOSCOPY WITH PROPOFOL;  Surgeon: Lesly Rubenstein, MD;  Location: [facility] ENDOSCOPY;  Service: Endoscopy;   Laterality: N/A;   EUS N/A 04/17/2019   Procedure: FULL UPPER ENDOSCOPIC ULTRASOUND (EUS) RADIAL;  Surgeon: Holly Bodily, MD;  Location: [facility] ENDOSCOPY;  Service: Gastroenterology;  Laterality: N/A;   KIDNEY STONE SURGERY     PARTIAL COLECTOMY  10/17/2013   PORTACATH PLACEMENT Right 06/13/2019   Procedure: INSERTION PORT-A-CATH;  Surgeon: Benjamine Sprague, DO;  Location: [facility] ORS;  Service: General;  Laterality: Right;    Family History  Problem Relation Age of Onset   Cancer Mother    Breast cancer Mother    COPD Father     Social History:  reports that he has never smoked. He has never used smokeless tobacco. He reports current alcohol use. He reports that he does not use drugs. 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 He is married and his wife's name is [name] [phone]).    Allergies: No Known Allergies  Current Medications: Current Outpatient Medications  Medication Sig Dispense Refill   acetaminophen (TYLENOL) 500 MG tablet Take 500 mg by mouth every 6 (six) hours as needed.     amLODipine (NORVASC) 2.5 MG tablet Take 2.5 mg by mouth daily.     aspirin EC 81 MG tablet Take 81 mg by mouth daily.     Cholecalciferol (VITAMIN D) 125 MCG (5000 UT) CAPS Take 5,000 mg by mouth.     docusate sodium (COLACE) 100 MG capsule Take 100 mg by mouth 2 (two) times daily.     gabapentin (NEURONTIN) 100 MG capsule Take 1 capsule (100 mg total) by mouth 3 (three) times daily. 90 capsule 0   HYDROcodone-acetaminophen (NORCO/VICODIN) 5-325 MG tablet Take 1 tablet by mouth every 6 (six) hours as needed for moderate pain (up to 3 doses for moderate pain.).     lidocaine-prilocaine (EMLA) cream Apply to affected area once 30 g 3   loratadine (CLARITIN) 10 MG tablet Take 10 mg by mouth daily.     olmesartan (BENICAR) 20 MG tablet Take 1 tablet by mouth daily.     ondansetron (ZOFRAN) 8 MG tablet Take 1 tablet (8 mg total) by mouth 2 (two) times daily as needed for  refractory nausea / vomiting. Start on day 3 after chemotherapy. 60 tablet 1   potassium chloride SA (KLOR-CON M) 20 MEQ tablet Take 1 tablet (20 mEq total) by mouth 2 (two) times daily. 60 tablet 0   Probiotic Product (PROBIOTIC DAILY PO) Take 1 tablet by mouth daily.     prochlorperazine (COMPAZINE) 10 MG tablet Take 1 tablet (10 mg total) by mouth every 6 (six) hours as needed (NAUSEA). 30 tablet 1   NEOMYCIN-POLYMYXIN-HYDROCORTISONE (CORTISPORIN) 1 % SOLN OTIC solution SMARTSIG:Left Ear (Patient not taking: Reported on 05/03/2021)     No current facility-administered medications for this visit.   Facility-Administered Medications Ordered in Other Visits  Medication Dose Route Frequency Provider Last Rate Last Admin   0.9 %  sodium chloride infusion   Intravenous Once Corcoran, Drue Second, MD       0.9 %  sodium chloride infusion   Intravenous Continuous Nolon Stalls C, MD 10 mL/hr at 12/31/19 1000 New Bag at 10/05/20 1045   heparin lock flush 100 unit/mL  500 Units Intravenous Once Lequita Asal, MD        Performance status (ECOG): 1  Vitals Blood pressure (!) 145/77, pulse 63, temperature (!) 97.2 F (36.2 C), resp. rate 18, weight 262 lb (118.8 kg).   Physical Exam Vitals and nursing note reviewed.  Constitutional:      General: He is not in acute distress.    Appearance: He is well-developed. He is obese. He is not diaphoretic.     Interventions: Face mask in place.     Comments: Patient walks with a cane  HENT:     Head: Normocephalic and atraumatic.     Mouth/Throat:     Mouth: Mucous membranes are moist.     Pharynx: Oropharynx is clear.  Eyes:     General: No scleral icterus.    Extraocular Movements: Extraocular movements intact.     Conjunctiva/sclera: Conjunctivae normal.     Pupils: Pupils are equal, round, and reactive to light.  Cardiovascular:     Rate and Rhythm: Normal rate and regular rhythm.     Heart sounds: Normal heart sounds. No murmur  heard. Pulmonary:     Effort: Pulmonary effort is normal. No respiratory distress.     Breath sounds: No wheezing or rales.     Comments: Decreased breath sound bilaterally Chest:     Chest wall: No tenderness.  Abdominal:     General: Bowel sounds are normal. There is no distension.     Palpations: Abdomen is soft. There is no mass.     Tenderness: There is no abdominal tenderness. There is no guarding or rebound.  Musculoskeletal:        General: No swelling or tenderness. Normal range of motion.     Cervical back: Normal range of motion and neck supple.     Right lower leg: Edema present.     Left lower leg: Edema present.  Lymphadenopathy:     Head:     Right side of head: No preauricular, posterior auricular or occipital adenopathy.     Left side of head: No preauricular, posterior auricular or occipital adenopathy.     Cervical: No cervical adenopathy.     Upper Body:     Right upper body: No supraclavicular or axillary adenopathy.     Left upper body: No supraclavicular or axillary adenopathy.     Lower Body: No right inguinal adenopathy. No left inguinal adenopathy.  Skin:    General: Skin is warm and dry.  Neurological:     General: No focal deficit present.     Mental Status: He is alert and oriented to person, place, and time. Mental status is at baseline.  Psychiatric:        Mood and Affect: Mood normal.   Infusion on 05/27/2021  Component Date Value Ref Range Status   Sodium 05/27/2021 134 (L)  135 - 145 mmol/L Final   Potassium 05/27/2021 3.8  3.5 - 5.1 mmol/L Final   Chloride 05/27/2021 103  98 - 111 mmol/L Final   CO2 05/27/2021 24  22 - 32 mmol/L Final   Glucose, Bld 05/27/2021 112 (H)  70 - 99 mg/dL Final   Glucose reference range applies only to samples taken after fasting for at least 8 hours.   BUN 05/27/2021 10  8 - 23 mg/dL Final  Creatinine, Ser 05/27/2021 0.64  0.61 - 1.24 mg/dL Final   Calcium 05/27/2021 9.1  8.9 - 10.3 mg/dL Final   Total  Protein 05/27/2021 6.9  6.5 - 8.1 g/dL Final   Albumin 05/27/2021 3.8  3.5 - 5.0 g/dL Final   AST 05/27/2021 46 (H)  15 - 41 U/L Final   ALT 05/27/2021 35  0 - 44 U/L Final   Alkaline Phosphatase 05/27/2021 46  38 - 126 U/L Final   Total Bilirubin 05/27/2021 0.8  0.3 - 1.2 mg/dL Final   GFR, Estimated 05/27/2021 >60  >60 mL/min Final   Comment: (NOTE) Calculated using the CKD-EPI Creatinine Equation (2021)    Anion gap 05/27/2021 7  5 - 15 Final   Performed at [facility], [place], [place] [address]   WBC 05/27/2021 4.8  4.0 - 10.5 K/uL Final   RBC 05/27/2021 4.13 (L)  4.22 - 5.81 MIL/uL Final   Hemoglobin 05/27/2021 13.3  13.0 - 17.0 g/dL Final   HCT 05/27/2021 39.8  39.0 - 52.0 % Final   MCV 05/27/2021 96.4  80.0 - 100.0 fL Final   MCH 05/27/2021 32.2  26.0 - 34.0 pg Final   MCHC 05/27/2021 33.4  30.0 - 36.0 g/dL Final   RDW 05/27/2021 14.3  11.5 - 15.5 % Final   Platelets 05/27/2021 209  150 - 400 K/uL Final   nRBC 05/27/2021 0.0  0.0 - 0.2 % Final   Neutrophils Relative % 05/27/2021 55  % Final   Neutro Abs 05/27/2021 2.6  1.7 - 7.7 K/uL Final   Lymphocytes Relative 05/27/2021 27  % Final   Lymphs Abs 05/27/2021 1.3  0.7 - 4.0 K/uL Final   Monocytes Relative 05/27/2021 13  % Final   Monocytes Absolute 05/27/2021 0.6  0.1 - 1.0 K/uL Final   Eosinophils Relative 05/27/2021 3  % Final   Eosinophils Absolute 05/27/2021 0.1  0.0 - 0.5 K/uL Final   Basophils Relative 05/27/2021 1  % Final   Basophils Absolute 05/27/2021 0.1  0.0 - 0.1 K/uL Final   Immature Granulocytes 05/27/2021 1  % Final   Abs Immature Granulocytes 05/27/2021 0.04  0.00 - 0.07 K/uL Final   Performed at [facility], [address], [place], [place] [address]    Assessment and plan 1. 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 Encounter for antineoplastic chemotherapy    #Metastatic colon cancer-recurrent Overall he tolerates chemotherapy.   Labs  reviewed and discussed with patient Proceed with 5-FU and bevacizumab today CEA starts to trend up.  Today's level is pending.  Obtain CT chest abdomen pelvis with contrast.   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  Patient has gabapentin supply at home.Patient declines gabapentin and acupuncture. Symptoms are manageable.   . Follow-up 2 weeks lab MD 5-FU/bevacizumab -   I discussed the assessment and treatment plan with the patient.  The patient was provided an opportunity to ask questions and all were answered.  The patient agreed with the plan and demonstrated an understanding of the instructions.  The patient was advised to call back if the symptoms worsen or if the condition fails to improve as anticipated.  [name], MD, PhD 05/27/2021

## 2021-05-30 ENCOUNTER — Other Ambulatory Visit: Payer: Self-pay

## 2021-05-30 ENCOUNTER — Inpatient Hospital Stay: Payer: Medicare Other

## 2021-05-30 VITALS — BP 149/87 | HR 78 | Temp 99.0°F | Resp 18

## 2021-05-30 DIAGNOSIS — C786 Secondary malignant neoplasm of retroperitoneum and peritoneum: Secondary | ICD-10-CM

## 2021-05-30 DIAGNOSIS — Z5112 Encounter for antineoplastic immunotherapy: Secondary | ICD-10-CM | POA: Diagnosis not present

## 2021-05-30 DIAGNOSIS — Z85038 Personal history of other malignant neoplasm of large intestine: Secondary | ICD-10-CM

## 2021-05-30 MED ORDER — SODIUM CHLORIDE 0.9% FLUSH
10.0000 mL | INTRAVENOUS | Status: DC | PRN
  Administered 2021-05-30: 10 mL
  Filled 2021-05-30: qty 10

## 2021-05-30 MED ORDER — HEPARIN SOD (PORK) LOCK FLUSH 100 UNIT/ML IV SOLN
500.0000 [IU] | Freq: Once | INTRAVENOUS | Status: AC | PRN
  Administered 2021-05-30: 500 [IU]
  Filled 2021-05-30: qty 5

## 2021-06-03 ENCOUNTER — Other Ambulatory Visit: Payer: Self-pay | Admitting: Oncology

## 2021-06-03 NOTE — Telephone Encounter (Signed)
°  Component Ref Range & Units 7 d ago (05/27/21) 2 wk ago (05/18/21) 1 mo ago (05/03/21) 1 mo ago (04/19/21) 1 mo ago (04/05/21) 2 mo ago (03/22/21) 2 mo ago (03/08/21)  Potassium 3.5 - 5.1 mmol/L 3.8  3.5  3.1 Low   3.4 Low   3.5  3.6  3.7

## 2021-06-06 ENCOUNTER — Encounter: Payer: Self-pay | Admitting: Oncology

## 2021-06-08 ENCOUNTER — Other Ambulatory Visit: Payer: Self-pay

## 2021-06-08 ENCOUNTER — Ambulatory Visit
Admission: RE | Admit: 2021-06-08 | Discharge: 2021-06-08 | Disposition: A | Discharge status: Home / Self Care | Payer: Medicare Other | Source: Ambulatory Visit | Attending: Oncology | Admitting: Oncology

## 2021-06-08 DIAGNOSIS — C189 Malignant neoplasm of colon, unspecified: Secondary | ICD-10-CM | POA: Insufficient documentation

## 2021-06-08 MED ORDER — IOHEXOL 300 MG/ML  SOLN
100.0000 mL | Freq: Once | INTRAMUSCULAR | Status: AC | PRN
  Administered 2021-06-08: 11:00:00 100 mL via INTRAVENOUS

## 2021-06-10 ENCOUNTER — Inpatient Hospital Stay (HOSPITAL_BASED_OUTPATIENT_CLINIC_OR_DEPARTMENT_OTHER): Payer: Medicare Other | Admitting: Oncology

## 2021-06-10 ENCOUNTER — Encounter: Payer: Self-pay | Admitting: Oncology

## 2021-06-10 ENCOUNTER — Inpatient Hospital Stay: Payer: Medicare Other

## 2021-06-10 ENCOUNTER — Other Ambulatory Visit: Payer: Self-pay

## 2021-06-10 VITALS — BP 118/84 | HR 78 | Temp 97.6°F | Resp 16 | Wt 264.0 lb

## 2021-06-10 DIAGNOSIS — T451X5D Adverse effect of antineoplastic and immunosuppressive drugs, subsequent encounter: Secondary | ICD-10-CM

## 2021-06-10 DIAGNOSIS — G620 Drug-induced polyneuropathy: Secondary | ICD-10-CM

## 2021-06-10 DIAGNOSIS — C184 Malignant neoplasm of transverse colon: Secondary | ICD-10-CM

## 2021-06-10 DIAGNOSIS — Z5112 Encounter for antineoplastic immunotherapy: Secondary | ICD-10-CM | POA: Diagnosis not present

## 2021-06-10 DIAGNOSIS — C786 Secondary malignant neoplasm of retroperitoneum and peritoneum: Secondary | ICD-10-CM

## 2021-06-10 DIAGNOSIS — C189 Malignant neoplasm of colon, unspecified: Secondary | ICD-10-CM

## 2021-06-10 DIAGNOSIS — Z85038 Personal history of other malignant neoplasm of large intestine: Secondary | ICD-10-CM

## 2021-06-10 DIAGNOSIS — Z5111 Encounter for antineoplastic chemotherapy: Secondary | ICD-10-CM

## 2021-06-10 LAB — CBC WITH DIFFERENTIAL/PLATELET
Abs Immature Granulocytes: 0.01 10*3/uL (ref 0.00–0.07)
Basophils Absolute: 0 10*3/uL (ref 0.0–0.1)
Basophils Relative: 1 %
Eosinophils Absolute: 0.1 10*3/uL (ref 0.0–0.5)
Eosinophils Relative: 2 %
HCT: 39.5 % (ref 39.0–52.0)
Hemoglobin: 13 g/dL (ref 13.0–17.0)
Immature Granulocytes: 0 %
Lymphocytes Relative: 30 %
Lymphs Abs: 1.5 10*3/uL (ref 0.7–4.0)
MCH: 32.3 pg (ref 26.0–34.0)
MCHC: 32.9 g/dL (ref 30.0–36.0)
MCV: 98 fL (ref 80.0–100.0)
Monocytes Absolute: 0.5 10*3/uL (ref 0.1–1.0)
Monocytes Relative: 10 %
Neutro Abs: 2.8 10*3/uL (ref 1.7–7.7)
Neutrophils Relative %: 57 %
Platelets: 186 10*3/uL (ref 150–400)
RBC: 4.03 MIL/uL — ABNORMAL LOW (ref 4.22–5.81)
RDW: 13.7 % (ref 11.5–15.5)
WBC: 4.9 10*3/uL (ref 4.0–10.5)
nRBC: 0 % (ref 0.0–0.2)

## 2021-06-10 LAB — COMPREHENSIVE METABOLIC PANEL
ALT: 39 U/L (ref 0–44)
AST: 51 U/L — ABNORMAL HIGH (ref 15–41)
Albumin: 3.7 g/dL (ref 3.5–5.0)
Alkaline Phosphatase: 48 U/L (ref 38–126)
Anion gap: 8 (ref 5–15)
BUN: 9 mg/dL (ref 8–23)
CO2: 25 mmol/L (ref 22–32)
Calcium: 9.4 mg/dL (ref 8.9–10.3)
Chloride: 102 mmol/L (ref 98–111)
Creatinine, Ser: 0.58 mg/dL — ABNORMAL LOW (ref 0.61–1.24)
GFR, Estimated: >60 mL/min (ref >60)
Glucose, Bld: 99 mg/dL (ref 70–99)
Potassium: 4 mmol/L (ref 3.5–5.1)
Sodium: 135 mmol/L (ref 135–145)
Total Bilirubin: 1.4 mg/dL — ABNORMAL HIGH (ref 0.3–1.2)
Total Protein: 7.1 g/dL (ref 6.5–8.1)

## 2021-06-10 LAB — PROTEIN, URINE, RANDOM: Total Protein, Urine: 14 mg/dL

## 2021-06-10 MED ORDER — SODIUM CHLORIDE 0.9 % IV SOLN
10.0000 mg | Freq: Once | INTRAVENOUS | Status: AC
  Administered 2021-06-10: 10 mg via INTRAVENOUS
  Filled 2021-06-10: qty 10

## 2021-06-10 MED ORDER — SODIUM CHLORIDE 0.9 % IV SOLN
Freq: Once | INTRAVENOUS | Status: AC
  Filled 2021-06-10: qty 250

## 2021-06-10 MED ORDER — SODIUM CHLORIDE 0.9 % IV SOLN
5.0000 mg/kg | Freq: Once | INTRAVENOUS | Status: AC
  Administered 2021-06-10: 600 mg via INTRAVENOUS
  Filled 2021-06-10: qty 16

## 2021-06-10 MED ORDER — SODIUM CHLORIDE 0.9 % IV SOLN
401.0000 mg/m2 | Freq: Once | INTRAVENOUS | Status: AC
  Administered 2021-06-10: 950 mg via INTRAVENOUS
  Filled 2021-06-10: qty 47.5

## 2021-06-10 MED ORDER — FLUOROURACIL CHEMO INJECTION 2.5 GM/50ML
400.0000 mg/m2 | Freq: Once | INTRAVENOUS | Status: AC
  Administered 2021-06-10: 950 mg via INTRAVENOUS
  Filled 2021-06-10: qty 19

## 2021-06-10 MED ORDER — SODIUM CHLORIDE 0.9% FLUSH
10.0000 mL | Freq: Once | INTRAVENOUS | Status: AC
  Administered 2021-06-10: 10 mL via INTRAVENOUS
  Filled 2021-06-10: qty 10

## 2021-06-10 MED ORDER — SODIUM CHLORIDE 0.9 % IV SOLN
2400.0000 mg/m2 | INTRAVENOUS | Status: DC
  Administered 2021-06-10: 5700 mg via INTRAVENOUS
  Filled 2021-06-10: qty 114

## 2021-06-10 NOTE — Patient Instructions (Signed)
[facility] AT [place]  Discharge Instructions: Thank you for choosing [facility] to provide your oncology and hematology care.  If you have a lab appointment with the [facility], please go directly to the [facility] and check in at the registration area.  Wear comfortable clothing and clothing appropriate for easy access to any Portacath or PICC line.   We strive to give you quality time with your provider. You may need to reschedule your appointment if you arrive late (15 or more minutes).  Arriving late affects you and other patients whose appointments are after yours.  Also, if you miss three or more appointments without notifying the office, you may be dismissed from the clinic at the providers discretion.      For prescription refill requests, have your pharmacy contact our office and allow 72 hours for refills to be completed.    Today you received the following chemotherapy and/or immunotherapy agents Zirabev, Leucovorin, 5 Fu      To help prevent nausea and vomiting after your treatment, we encourage you to take your nausea medication as directed.  BELOW ARE SYMPTOMS THAT SHOULD BE REPORTED IMMEDIATELY: *FEVER GREATER THAN 100.4 F (38 C) OR HIGHER *CHILLS OR SWEATING *NAUSEA AND VOMITING THAT IS NOT CONTROLLED WITH YOUR NAUSEA MEDICATION *UNUSUAL SHORTNESS OF BREATH *UNUSUAL BRUISING OR BLEEDING *URINARY PROBLEMS (pain or burning when urinating, or frequent urination) *BOWEL PROBLEMS (unusual diarrhea, constipation, pain near the anus) TENDERNESS IN MOUTH AND THROAT WITH OR WITHOUT PRESENCE OF ULCERS (sore throat, sores in mouth, or a toothache) UNUSUAL RASH, SWELLING OR PAIN  UNUSUAL VAGINAL DISCHARGE OR ITCHING   Items with * indicate a potential emergency and should be followed up as soon as possible or go to the Emergency Department if any problems should occur.  Please show the CHEMOTHERAPY ALERT CARD or IMMUNOTHERAPY ALERT CARD  at check-in to the Emergency Department and triage nurse.  Should you have questions after your visit or need to cancel or reschedule your appointment, please contact [facility] [place] [facility] [place]  [phone] and follow the prompts.  Office hours are 8:00 a.m. to 4:30 p.m. Monday - Friday. Please note that voicemails left after 4:00 p.m. may not be returned until the following business day.  We are closed weekends and major holidays. You have access to a nurse at all times for urgent questions. Please call the main number to the clinic [phone] and follow the prompts.  For any non-urgent questions, you may also contact your provider using MyChart. We now offer e-Visits for anyone 61 and older to request care online for non-urgent symptoms. For details visit [email or web address].   Also download the MyChart app! Go to the app store, search "MyChart", open the app, select [facility], and log in with your MyChart username and password.  Due to Covid, a mask is required upon entering the hospital/clinic. If you do not have a mask, one will be given to you upon arrival. For doctor visits, patients may have 1 support person aged 48 or older with them. For treatment visits, patients cannot have anyone with them due to current Covid guidelines and our immunocompromised population.   Bevacizumab injection What is this medication? BEVACIZUMAB (be va SIZ yoo mab) is a monoclonal antibody. It is used to treat many types of cancer. This medicine may be used for other purposes; ask your health care provider or pharmacist if you have questions. COMMON BRAND NAME(S): Alymsys, Avastin, MVASI, Zirabev What  should I tell my care team before I take this medication? They need to know if you have any of these conditions: diabetes heart disease high blood pressure history of coughing up blood prior anthracycline chemotherapy (e.g., doxorubicin, daunorubicin, epirubicin) recent or ongoing  radiation therapy recent or planning to have surgery stroke an unusual or allergic reaction to bevacizumab, hamster proteins, mouse proteins, other medicines, foods, dyes, or preservatives pregnant or trying to get pregnant breast-feeding How should I use this medication? This medicine is for infusion into a vein. It is given by a health care professional in a hospital or clinic setting. Talk to your pediatrician regarding the use of this medicine in children. Special care may be needed. Overdosage: If you think you have taken too much of this medicine contact a poison control center or emergency room at once. NOTE: This medicine is only for you. Do not share this medicine with others. What if I miss a dose? It is important not to miss your dose. Call your doctor or health care professional if you are unable to keep an appointment. What may interact with this medication? Interactions are not expected. This list may not describe all possible interactions. Give your health care provider a list of all the medicines, herbs, non-prescription drugs, or dietary supplements you use. Also tell them if you smoke, drink alcohol, or use illegal drugs. Some items may interact with your medicine. What should I watch for while using this medication? Your condition will be monitored carefully while you are receiving this medicine. You will need important blood work and urine testing done while you are taking this medicine. This medicine may increase your risk to bruise or bleed. Call your doctor or health care professional if you notice any unusual bleeding. Before having surgery, talk to your health care provider to make sure it is ok. This drug can increase the risk of poor healing of your surgical site or wound. You will need to stop this drug for 28 days before surgery. After surgery, wait at least 28 days before restarting this drug. Make sure the surgical site or wound is healed enough before restarting  this drug. Talk to your health care provider if questions. Do not become pregnant while taking this medicine or for 6 months after stopping it. Women should inform their doctor if they wish to become pregnant or think they might be pregnant. There is a potential for serious side effects to an unborn child. Talk to your health care professional or pharmacist for more information. Do not breast-feed an infant while taking this medicine and for 6 months after the last dose. This medicine has caused ovarian failure in some women. This medicine may interfere with the ability to have a child. You should talk to your doctor or health care professional if you are concerned about your fertility. What side effects may I notice from receiving this medication? Side effects that you should report to your doctor or health care professional as soon as possible: allergic reactions like skin rash, itching or hives, swelling of the face, lips, or tongue chest pain or chest tightness chills coughing up blood high fever seizures severe constipation signs and symptoms of bleeding such as bloody or black, tarry stools; red or dark-brown urine; spitting up blood or brown material that looks like coffee grounds; red spots on the skin; unusual bruising or bleeding from the eye, gums, or nose signs and symptoms of a blood clot such as breathing problems; chest pain; severe,  sudden headache; pain, swelling, warmth in the leg signs and symptoms of a stroke like changes in vision; confusion; trouble speaking or understanding; severe headaches; sudden numbness or weakness of the face, arm or leg; trouble walking; dizziness; loss of balance or coordination stomach pain sweating swelling of legs or ankles vomiting weight gain Side effects that usually do not require medical attention (report to your doctor or health care professional if they continue or are bothersome): back pain changes in taste decreased appetite dry  skin nausea tiredness This list may not describe all possible side effects. Call your doctor for medical advice about side effects. You may report side effects to FDA at 1-800-FDA-1088. Where should I keep my medication? This drug is given in a hospital or clinic and will not be stored at home. NOTE: This sheet is a summary. It may not cover all possible information. If you have questions about this medicine, talk to your doctor, pharmacist, or health care provider.  2022 Elsevier/Gold Standard (2021-01-18 00:00:00)  Leucovorin injection What is this medication? LEUCOVORIN (loo koe VOR in) is used to prevent or treat the harmful effects of some medicines. This medicine is used to treat anemia caused by a low amount of folic acid in the body. It is also used with 5-fluorouracil (5-FU) to treat colon cancer. This medicine may be used for other purposes; ask your health care provider or pharmacist if you have questions. What should I tell my care team before I take this medication? They need to know if you have any of these conditions: anemia from low levels of vitamin B-12 in the blood an unusual or allergic reaction to leucovorin, folic acid, other medicines, foods, dyes, or preservatives pregnant or trying to get pregnant breast-feeding How should I use this medication? This medicine is for injection into a muscle or into a vein. It is given by a health care professional in a hospital or clinic setting. Talk to your pediatrician regarding the use of this medicine in children. Special care may be needed. Overdosage: If you think you have taken too much of this medicine contact a poison control center or emergency room at once. NOTE: This medicine is only for you. Do not share this medicine with others. What if I miss a dose? This does not apply. What may interact with this medication? capecitabine fluorouracil phenobarbital phenytoin primidone trimethoprim-sulfamethoxazole This list  may not describe all possible interactions. Give your health care provider a list of all the medicines, herbs, non-prescription drugs, or dietary supplements you use. Also tell them if you smoke, drink alcohol, or use illegal drugs. Some items may interact with your medicine. What should I watch for while using this medication? Your condition will be monitored carefully while you are receiving this medicine. This medicine may increase the side effects of 5-fluorouracil, 5-FU. Tell your doctor or health care professional if you have diarrhea or mouth sores that do not get better or that get worse. What side effects may I notice from receiving this medication? Side effects that you should report to your doctor or health care professional as soon as possible: allergic reactions like skin rash, itching or hives, swelling of the face, lips, or tongue breathing problems fever, infection mouth sores unusual bleeding or bruising unusually weak or tired Side effects that usually do not require medical attention (report to your doctor or health care professional if they continue or are bothersome): constipation or diarrhea loss of appetite nausea, vomiting This list may not describe  all possible side effects. Call your doctor for medical advice about side effects. You may report side effects to FDA at 1-800-FDA-1088. Where should I keep my medication? This drug is given in a hospital or clinic and will not be stored at home. NOTE: This sheet is a summary. It may not cover all possible information. If you have questions about this medicine, talk to your doctor, pharmacist, or health care provider.  2022 Elsevier/Gold Standard (2007-11-07 00:00:00)  Fluorouracil, 5-FU injection What is this medication? FLUOROURACIL, 5-FU (flure oh YOOR a sil) is a chemotherapy drug. It slows the growth of cancer cells. This medicine is used to treat many types of cancer like breast cancer, colon or rectal cancer,  pancreatic cancer, and stomach cancer. This medicine may be used for other purposes; ask your health care provider or pharmacist if you have questions. COMMON BRAND NAME(S): Adrucil What should I tell my care team before I take this medication? They need to know if you have any of these conditions: blood disorders dihydropyrimidine dehydrogenase (DPD) deficiency infection (especially a virus infection such as chickenpox, cold sores, or herpes) kidney disease liver disease malnourished, poor nutrition recent or ongoing radiation therapy an unusual or allergic reaction to fluorouracil, other chemotherapy, other medicines, foods, dyes, or preservatives pregnant or trying to get pregnant breast-feeding How should I use this medication? This drug is given as an infusion or injection into a vein. It is administered in a hospital or clinic by a specially trained health care professional. Talk to your pediatrician regarding the use of this medicine in children. Special care may be needed. Overdosage: If you think you have taken too much of this medicine contact a poison control center or emergency room at once. NOTE: This medicine is only for you. Do not share this medicine with others. What if I miss a dose? It is important not to miss your dose. Call your doctor or health care professional if you are unable to keep an appointment. What may interact with this medication? Do not take this medicine with any of the following medications: live virus vaccines This medicine may also interact with the following medications: medicines that treat or prevent blood clots like warfarin, enoxaparin, and dalteparin This list may not describe all possible interactions. Give your health care provider a list of all the medicines, herbs, non-prescription drugs, or dietary supplements you use. Also tell them if you smoke, drink alcohol, or use illegal drugs. Some items may interact with your medicine. What should  I watch for while using this medication? Visit your doctor for checks on your progress. This drug may make you feel generally unwell. This is not uncommon, as chemotherapy can affect healthy cells as well as cancer cells. Report any side effects. Continue your course of treatment even though you feel ill unless your doctor tells you to stop. In some cases, you may be given additional medicines to help with side effects. Follow all directions for their use. Call your doctor or health care professional for advice if you get a fever, chills or sore throat, or other symptoms of a cold or flu. Do not treat yourself. This drug decreases your body's ability to fight infections. Try to avoid being around people who are sick. This medicine may increase your risk to bruise or bleed. Call your doctor or health care professional if you notice any unusual bleeding. Be careful brushing and flossing your teeth or using a toothpick because you may get an infection or bleed  more easily. If you have any dental work done, tell your dentist you are receiving this medicine. Avoid taking products that contain aspirin, acetaminophen, ibuprofen, naproxen, or ketoprofen unless instructed by your doctor. These medicines may hide a fever. Do not become pregnant while taking this medicine. Women should inform their doctor if they wish to become pregnant or think they might be pregnant. There is a potential for serious side effects to an unborn child. Talk to your health care professional or pharmacist for more information. Do not breast-feed an infant while taking this medicine. Men should inform their doctor if they wish to father a child. This medicine may lower sperm counts. Do not treat diarrhea with over the counter products. Contact your doctor if you have diarrhea that lasts more than 2 days or if it is severe and watery. This medicine can make you more sensitive to the sun. Keep out of the sun. If you cannot avoid being in  the sun, wear protective clothing and use sunscreen. Do not use sun lamps or tanning beds/booths. What side effects may I notice from receiving this medication? Side effects that you should report to your doctor or health care professional as soon as possible: allergic reactions like skin rash, itching or hives, swelling of the face, lips, or tongue low blood counts - this medicine may decrease the number of white blood cells, red blood cells and platelets. You may be at increased risk for infections and bleeding. signs of infection - fever or chills, cough, sore throat, pain or difficulty passing urine signs of decreased platelets or bleeding - bruising, pinpoint red spots on the skin, black, tarry stools, blood in the urine signs of decreased red blood cells - unusually weak or tired, fainting spells, lightheadedness breathing problems changes in vision chest pain mouth sores nausea and vomiting pain, swelling, redness at site where injected pain, tingling, numbness in the hands or feet redness, swelling, or sores on hands or feet stomach pain unusual bleeding Side effects that usually do not require medical attention (report to your doctor or health care professional if they continue or are bothersome): changes in finger or toe nails diarrhea dry or itchy skin hair loss headache loss of appetite sensitivity of eyes to the light stomach upset unusually teary eyes This list may not describe all possible side effects. Call your doctor for medical advice about side effects. You may report side effects to FDA at 1-800-FDA-1088. Where should I keep my medication? This drug is given in a hospital or clinic and will not be stored at home. NOTE: This sheet is a summary. It may not cover all possible information. If you have questions about this medicine, talk to your doctor, pharmacist, or health care provider.  2022 Elsevier/Gold Standard (2021-01-18 00:00:00)

## 2021-06-10 NOTE — Progress Notes (Signed)
Hematology Oncology Progress Note   Clinic Day: 06/10/2021   Referring physician: Tracie Harrier, MD  Chief Complaint: Jared Gutierrez is a 81 y.o. male presents for follow-up of metastatic colon cancer   PERTINENT ONCOLOGY HISTORY Jared Gutierrez is a 81 y.o.[place] who has above oncology history reviewed by me today presented for follow up visit for management of  Metastatic colon cancer. [name], patient switched care to me on 11/11/20 Extensive medical record review was performed by me  # Metastatic colon cancer.  he was diagnosed with stage I colon cancer s/p transverse colectomy on 10/17/2013.  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  Tumor extended into the submucosa. Margins were negative. + lymphovascular invasion. 14 lymph nodes were negative. Pathologic stage was T1 N0.   10/02/2014 Colonoscopy showed several 3 mm polyps and an 8 mm polyp in the cecum and transverse colon.  Pathology revealed tubular adenomas negative for high-grade dysplasia and malignancy.  02/16/2017 Colonoscopy  revealed 2 diminutive polyps in the descending colon.  Pathology revealed tubular adenomas without dysplasia or malignancy.  Her CEA has been monitored and was noted to increase starting July 2020. 04/02/2019  PET scan  limited evealed a 2.4 x 2.3 cm (SUV 11) hypermetabolic soft tissue density caudal and anterior to the pancreatic neck favored to represent isolated peritoneal or nodal metastasis in the setting of prior transverse colonic resection (expected primary drainage). Although this was immediately adjacent to the pancreas, a fat plane was maintained, arguing strongly against a pancreatic primary. Otherwise, there was no evidence of hypermetabolic metastasis.   04/17/2019 EUS on  revealed a normal esophagus, stomach, duodenum, and pancreas.  There was a  2.4 x 2.4 cm irregular mass in the retroperitoneum adjacent to, but not involving the pancreatic neck.  FNA and core needle biopsy were performed.  Pathology revealed adenocarcinoma compatible with a metastatic lesion of colorectal origin.  Tumor cells were positive for CK20 and CDX2 and negative for CK7.   NGS: [employer] on 05/15/2019 revealed + KRASG13D and TP53.  Negative results included BRAF V600E, Her2, NRAS, NTRK, PD-L1 (<1%), and TMB 8.7/Mb (intermediate).  MMR testing from his colon resection on 10/16/2013 was intact with a low probability of MSI-H.  06/25/2019, CEA 17.8. 07/09/2019 - 08/27/2019; 09/17/2019 - 10/15/2019; 11/12/2019 - 12/17/2019 He received 11 cycles of FOLFOX chemotherapy and 1 cycle of 5-FU and leucovorin (12/31/2019).  He received Neulasta after cycle #4 and #5 secondary to progressive leukopenia.  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  He received a truncated course of FOLFOX with cycle #11 secondary to oxaliplatin reaction.  08/27/2019, CEA 6.1 10/29/2019, CEA 7.7 12/31/2019, CEA 10.8.  01/14/2020 PET revealed an interval decrease in size and FDG uptake (2.4 x 2.3 cm with SUV 11 to 2.4 x 1.7 cm with SUV 4.09) associated with the previously referenced soft tissue density caudal and anterior to the pancreatic neck suggesting treatment response. There were no new sites of FDG avid tumor.  Radiation treatment 02/10/2020 - 02/23/2020.   He received a hybrid 3-dimensional course of 3000 cGy in 10 fractions to the residual FDG avid mass   05/27/2020, CEA 11.6 05/27/2020 Abdomen and pelvis CT :stable to minimally increased (2.9 x 1.8 cm compared to 2.4 x 1.7 cm) since 01/14/2020.  There were no new interval findings.    08/31/2020, CEA 30.9. 08/31/2020 Abdomen and pelvis CT revealed increased size  of the index soft tissue lesion inferior and anterior to the pancreatic neck (2.9 x 1.8 cm to 3.5 x 2.9 cm). There were similar prominent retroperitoneal lymph nodes without  adenopathy by size criteria. There was no new or enlarging abdominal or pelvic lymph nodes. There were no new interval findings. There was similar circumferential wall thickening of a nondistended urinary bladder, which likely accentuated wall thickening There was hepatic steatosis and aortic atherosclerosis.  11/03/2020, PET showed recurrent peritoneal metastasis in the upper abdomen adjacent to the pancreas.  The lesion is 3.8 x 2.9 cm with SUV of 11.3. No evidence of metastatic peritoneal disease elsewhere in the abdomen pelvis.  No evidence of distant metastasis.  Other medical problems Chronic lower extremity edema. 12/24/2018, right lower extremity duplex negative for DVT.  Small right Baker's cyst. 08/16/2019, bilateral lower extremity duplex showed no DVT.  08/16/2019 - 08/17/2019 with right lower extremity cellulitis.  He was unable to bear weight.  He was treated with IV fluids, NSAIDs, colchicine, and broad antibiotics (vancomycin and Cefepime).  He was discharged on indomethacin x 5 days and Keflex 500 mg TID x 5 days.  10/05/2020, colonoscopy showed internal hemorrhoids.  Otherwise normal examination. 11/03/2020 PET scan showed recurrent peritoneal metastasis near pancreas. Given that he has no other distant metastasis.  Repeat SBRT may be considered if he does not respond well to systemic chemotherapy.  Discussed with radiation oncology.  11/26/2020 patient was started on 5-FU and bevacizumab. 12/14/2020 CEA 29.5 02/17/2021 CT abdomen pelvis showed partial response. Mild decrease of peritoneal lesion.  Proceed with 5-FU and bevacizumab today.  Given that he is tolerating current regimen with good life quality, partial response.  Shared decision was made to hold off adding additional agents.  INTERVAL HISTORY Jared Gutierrez is a 81 y.o. male who has above history reviewed by me today presents for follow up visit for management of recurrent metastatic colon cancer Problems and complaints  are listed below: He was accompanied by wife Patient tolerates current chemotherapy.  Denies nausea vomiting diarrhea.  No new complaints.   Review of Systems  Constitutional:  Negative for appetite change, chills, diaphoresis, fever and unexpected weight change.  HENT:   Negative for hearing loss, nosebleeds, sore throat and tinnitus.   Respiratory:  Negative for cough, hemoptysis and shortness of breath.   Cardiovascular:  Positive for leg swelling. Negative for chest pain and palpitations.  Gastrointestinal:  Negative for abdominal pain, blood in stool, constipation, diarrhea, nausea and vomiting.  Genitourinary:  Negative for dysuria, frequency and hematuria.   Musculoskeletal:  Positive for arthralgias. Negative for back pain, myalgias and neck pain.  Skin:  Negative for itching and rash.  Neurological:  Negative for dizziness and headaches.  Hematological:  Does not bruise/bleed easily.  Psychiatric/Behavioral:  Negative for depression. The patient is not nervous/anxious.      Past Medical History:  Diagnosis Date   Arthritis    OSTEOARTHRITIS   Cancer ([place])    Cavitary lesion of lung    RIGHT LOWER LOBE   Chicken pox    Colon cancer ([place])    History of kidney stones    Hypertension    Lipoma of colon    Nephrolithiasis    Nephrolithiasis    Obesity    Shingles    Tubular adenoma of colon    multiple fragments    Past Surgical History:  Procedure Laterality Date   COLON SURGERY     COLONOSCOPY N/A 10/02/2014   Procedure:  COLONOSCOPY;  Surgeon: Josefine Class, MD;  Location: [facility] ENDOSCOPY;  Service: Endoscopy;  Laterality: N/A;   COLONOSCOPY WITH PROPOFOL N/A 02/16/2017   Procedure: COLONOSCOPY WITH PROPOFOL;  Surgeon: Manya Silvas, MD;  Location: [facility] ENDOSCOPY;  Service: Endoscopy;  Laterality: N/A;   COLONOSCOPY WITH PROPOFOL N/A 10/05/2020   Procedure: COLONOSCOPY WITH PROPOFOL;  Surgeon: Lesly Rubenstein, MD;  Location: [facility] ENDOSCOPY;  Service:  Endoscopy;  Laterality: N/A;   EUS N/A 04/17/2019   Procedure: FULL UPPER ENDOSCOPIC ULTRASOUND (EUS) RADIAL;  Surgeon: Holly Bodily, MD;  Location: [facility] ENDOSCOPY;  Service: Gastroenterology;  Laterality: N/A;   KIDNEY STONE SURGERY     PARTIAL COLECTOMY  10/17/2013   PORTACATH PLACEMENT Right 06/13/2019   Procedure: INSERTION PORT-A-CATH;  Surgeon: Benjamine Sprague, DO;  Location: [facility] ORS;  Service: General;  Laterality: Right;    Family History  Problem Relation Age of Onset   Cancer Mother    Breast cancer Mother    COPD Father     Social History:  reports that he has never smoked. He has never used smokeless tobacco. He reports current alcohol use. He reports that he does not use drugs. 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 He is married and his wife's name is [name] [phone]).    Allergies: No Known Allergies  Current Medications: Current Outpatient Medications  Medication Sig Dispense Refill   acetaminophen (TYLENOL) 500 MG tablet Take 500 mg by mouth every 6 (six) hours as needed.     amLODipine (NORVASC) 2.5 MG tablet Take 2.5 mg by mouth daily.     aspirin EC 81 MG tablet Take 81 mg by mouth daily.     Cholecalciferol (VITAMIN D) 125 MCG (5000 UT) CAPS Take 5,000 mg by mouth.     docusate sodium (COLACE) 100 MG capsule Take 100 mg by mouth 2 (two) times daily.     gabapentin (NEURONTIN) 100 MG capsule Take 1 capsule (100 mg total) by mouth 3 (three) times daily. 90 capsule 0   HYDROcodone-acetaminophen (NORCO/VICODIN) 5-325 MG tablet Take 1 tablet by mouth every 6 (six) hours as needed for moderate pain (up to 3 doses for moderate pain.).     KLOR-CON M20 20 MEQ tablet TAKE 1 TABLET BY MOUTH TWICE A DAY 60 tablet 0   lidocaine-prilocaine (EMLA) cream Apply to affected area once 30 g 3   loratadine (CLARITIN) 10 MG tablet Take 10 mg by mouth daily.     olmesartan (BENICAR) 20 MG tablet Take 1 tablet by mouth daily.     ondansetron  (ZOFRAN) 8 MG tablet Take 1 tablet (8 mg total) by mouth 2 (two) times daily as needed for refractory nausea / vomiting. Start on day 3 after chemotherapy. 60 tablet 1   Probiotic Product (PROBIOTIC DAILY PO) Take 1 tablet by mouth daily.     prochlorperazine (COMPAZINE) 10 MG tablet Take 1 tablet (10 mg total) by mouth every 6 (six) hours as needed (NAUSEA). 30 tablet 1   NEOMYCIN-POLYMYXIN-HYDROCORTISONE (CORTISPORIN) 1 % SOLN OTIC solution SMARTSIG:Left Ear (Patient not taking: Reported on 05/03/2021)     No current facility-administered medications for this visit.   Facility-Administered Medications Ordered in Other Visits  Medication Dose Route Frequency Provider Last Rate Last Admin   0.9 %  sodium chloride infusion   Intravenous Once Corcoran, Melissa C, MD       0.9 %  sodium chloride infusion   Intravenous Continuous  Lequita Asal, MD 10 mL/hr at 12/31/19 1000 New Bag at 10/05/20 1045   fluorouracil (ADRUCIL) 5,700 mg in sodium chloride 0.9 % 136 mL chemo infusion  2,400 mg/m2 (Order-Specific) Intravenous 1 day or 1 dose [name], MD   5,700 mg at 06/10/21 1256   heparin lock flush 100 unit/mL  500 Units Intravenous Once Lequita Asal, MD        Performance status (ECOG): 1  Vitals Blood pressure 118/84, pulse 78, temperature 97.6 F (36.4 C), temperature source Tympanic, resp. rate 16, weight 264 lb (119.7 kg), SpO2 98 %.   Physical Exam Vitals and nursing note reviewed.  Constitutional:      General: He is not in acute distress.    Appearance: He is well-developed. He is obese. He is not diaphoretic.     Interventions: Face mask in place.     Comments: Patient walks with a cane  HENT:     Head: Normocephalic and atraumatic.     Mouth/Throat:     Mouth: Mucous membranes are moist.     Pharynx: Oropharynx is clear.  Eyes:     General: No scleral icterus.    Extraocular Movements: Extraocular movements intact.     Conjunctiva/sclera: Conjunctivae normal.      Pupils: Pupils are equal, round, and reactive to light.  Cardiovascular:     Rate and Rhythm: Normal rate and regular rhythm.     Heart sounds: Normal heart sounds. No murmur heard. Pulmonary:     Effort: Pulmonary effort is normal. No respiratory distress.     Breath sounds: No wheezing or rales.     Comments: Decreased breath sound bilaterally Chest:     Chest wall: No tenderness.  Abdominal:     General: Bowel sounds are normal. There is no distension.     Palpations: Abdomen is soft. There is no mass.     Tenderness: There is no abdominal tenderness. There is no guarding or rebound.  Musculoskeletal:        General: No swelling or tenderness. Normal range of motion.     Cervical back: Normal range of motion and neck supple.     Right lower leg: Edema present.     Left lower leg: Edema present.  Lymphadenopathy:     Head:     Right side of head: No preauricular, posterior auricular or occipital adenopathy.     Left side of head: No preauricular, posterior auricular or occipital adenopathy.     Cervical: No cervical adenopathy.     Upper Body:     Right upper body: No supraclavicular or axillary adenopathy.     Left upper body: No supraclavicular or axillary adenopathy.     Lower Body: No right inguinal adenopathy. No left inguinal adenopathy.  Skin:    General: Skin is warm and dry.  Neurological:     General: No focal deficit present.     Mental Status: He is alert and oriented to person, place, and time. Mental status is at baseline.  Psychiatric:        Mood and Affect: Mood normal.   Infusion on 06/10/2021  Component Date Value Ref Range Status   Sodium 06/10/2021 135  135 - 145 mmol/L Final   Potassium 06/10/2021 4.0  3.5 - 5.1 mmol/L Final   Chloride 06/10/2021 102  98 - 111 mmol/L Final   CO2 06/10/2021 25  22 - 32 mmol/L Final   Glucose, Bld 06/10/2021 99  70 - 99  mg/dL Final   Glucose reference range applies only to samples taken after fasting for at least 8  hours.   BUN 06/10/2021 9  8 - 23 mg/dL Final   Creatinine, Ser 06/10/2021 0.58 (L)  0.61 - 1.24 mg/dL Final   Calcium 06/10/2021 9.4  8.9 - 10.3 mg/dL Final   Total Protein 06/10/2021 7.1  6.5 - 8.1 g/dL Final   Albumin 06/10/2021 3.7  3.5 - 5.0 g/dL Final   AST 06/10/2021 51 (H)  15 - 41 U/L Final   ALT 06/10/2021 39  0 - 44 U/L Final   Alkaline Phosphatase 06/10/2021 48  38 - 126 U/L Final   Total Bilirubin 06/10/2021 1.4 (H)  0.3 - 1.2 mg/dL Final   GFR, Estimated 06/10/2021 >60  >60 mL/min Final   Comment: (NOTE) Calculated using the CKD-EPI Creatinine Equation (2021)    Anion gap 06/10/2021 8  5 - 15 Final   Performed at [facility], [place], [place] [address]   WBC 06/10/2021 4.9  4.0 - 10.5 K/uL Final   RBC 06/10/2021 4.03 (L)  4.22 - 5.81 MIL/uL Final   Hemoglobin 06/10/2021 13.0  13.0 - 17.0 g/dL Final   HCT 06/10/2021 39.5  39.0 - 52.0 % Final   MCV 06/10/2021 98.0  80.0 - 100.0 fL Final   MCH 06/10/2021 32.3  26.0 - 34.0 pg Final   MCHC 06/10/2021 32.9  30.0 - 36.0 g/dL Final   RDW 06/10/2021 13.7  11.5 - 15.5 % Final   Platelets 06/10/2021 186  150 - 400 K/uL Final   nRBC 06/10/2021 0.0  0.0 - 0.2 % Final   Neutrophils Relative % 06/10/2021 57  % Final   Neutro Abs 06/10/2021 2.8  1.7 - 7.7 K/uL Final   Lymphocytes Relative 06/10/2021 30  % Final   Lymphs Abs 06/10/2021 1.5  0.7 - 4.0 K/uL Final   Monocytes Relative 06/10/2021 10  % Final   Monocytes Absolute 06/10/2021 0.5  0.1 - 1.0 K/uL Final   Eosinophils Relative 06/10/2021 2  % Final   Eosinophils Absolute 06/10/2021 0.1  0.0 - 0.5 K/uL Final   Basophils Relative 06/10/2021 1  % Final   Basophils Absolute 06/10/2021 0.0  0.0 - 0.1 K/uL Final   Immature Granulocytes 06/10/2021 0  % Final   Abs Immature Granulocytes 06/10/2021 0.01  0.00 - 0.07 K/uL Final   Performed at [facility], [address] [place], [place] [address]   Total Protein, Urine 06/10/2021 14  mg/dL Final    Comment: NO NORMAL RANGE ESTABLISHED FOR THIS TEST Performed at [facility], [address] [place], [place] [address]     Assessment and plan 1. 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 Encounter for antineoplastic chemotherapy    #Metastatic colon cancer-recurrent Overall he tolerates chemotherapy.   Labs reviewed and discussed with patient.  CEA has reached a nadir and he starts to trend up.  Today's level is pending. 06/08/2021, CT chest abdomen pelvis with contrast showed soft tissue mass at the base of mesentery inferior to the pancreatic neck is unchanged in size.  No progressive disease was identified. Continue 5-FU/bevacizumab today.  Discussed with patient and wife about short-term follow-up of CT images if CEA continues to progressively trend up.   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  Patient has gabapentin supply at home.Patient declines gabapentin and acupuncture. Symptoms are manageable.   [name]  Follow-up 2 weeks lab MD 5-FU/bevacizumab -   I discussed the assessment and treatment plan with the patient.  The patient was provided an opportunity to ask questions and all were answered.  The patient agreed with the plan and demonstrated an understanding of the instructions.  The patient was advised to call back if the symptoms worsen or if the condition fails to improve as anticipated.  [name], MD, PhD 06/10/2021

## 2021-06-10 NOTE — Progress Notes (Signed)
Pt in for follow up denies any concerns today.  

## 2021-06-11 LAB — CEA: CEA: 12 ng/mL — ABNORMAL HIGH (ref 0.0–4.7)

## 2021-06-13 ENCOUNTER — Other Ambulatory Visit: Payer: Self-pay

## 2021-06-13 ENCOUNTER — Inpatient Hospital Stay: Payer: Medicare Other

## 2021-06-13 VITALS — BP 166/85 | HR 65 | Temp 98.4°F | Resp 18

## 2021-06-13 DIAGNOSIS — C786 Secondary malignant neoplasm of retroperitoneum and peritoneum: Secondary | ICD-10-CM

## 2021-06-13 DIAGNOSIS — Z5112 Encounter for antineoplastic immunotherapy: Secondary | ICD-10-CM | POA: Diagnosis not present

## 2021-06-13 DIAGNOSIS — Z85038 Personal history of other malignant neoplasm of large intestine: Secondary | ICD-10-CM

## 2021-06-13 MED ORDER — HEPARIN SOD (PORK) LOCK FLUSH 100 UNIT/ML IV SOLN
500.0000 [IU] | Freq: Once | INTRAVENOUS | Status: AC | PRN
  Administered 2021-06-13: 500 [IU]
  Filled 2021-06-13: qty 5

## 2021-06-13 MED ORDER — SODIUM CHLORIDE 0.9% FLUSH
10.0000 mL | INTRAVENOUS | Status: DC | PRN
  Administered 2021-06-13: 10 mL
  Filled 2021-06-13: qty 10

## 2021-06-24 ENCOUNTER — Ambulatory Visit: Payer: Medicare Other

## 2021-06-24 ENCOUNTER — Ambulatory Visit: Payer: Medicare Other | Admitting: Oncology

## 2021-06-24 ENCOUNTER — Other Ambulatory Visit: Payer: Medicare Other

## 2021-06-28 ENCOUNTER — Inpatient Hospital Stay: Payer: Medicare Other

## 2021-06-28 ENCOUNTER — Telehealth: Payer: Self-pay | Admitting: Oncology

## 2021-06-28 ENCOUNTER — Other Ambulatory Visit: Payer: Self-pay

## 2021-06-28 ENCOUNTER — Telehealth: Payer: Self-pay

## 2021-06-28 ENCOUNTER — Inpatient Hospital Stay: Payer: Medicare Other | Admitting: Oncology

## 2021-06-28 ENCOUNTER — Ambulatory Visit
Admission: EM | Admit: 2021-06-28 | Discharge: 2021-06-28 | Disposition: A | Discharge status: Home / Self Care | Payer: Medicare Other | Attending: Emergency Medicine | Admitting: Emergency Medicine

## 2021-06-28 DIAGNOSIS — Z20822 Contact with and (suspected) exposure to covid-19: Secondary | ICD-10-CM | POA: Diagnosis present

## 2021-06-28 LAB — SARS CORONAVIRUS 2 (TAT 6-24 HRS): SARS Coronavirus 2: NEGATIVE

## 2021-06-28 NOTE — ED Triage Notes (Signed)
Patient presents to Urgent Care for covid testing. Asymptomatic. Exposure Sunday.

## 2021-06-28 NOTE — ED Provider Notes (Signed)
HPI  SUBJECTIVE:  Jared Gutierrez is a 81 y.o. male who presents for COVID testing.  He was exposed 2 days ago.  He currently has no complaints.  He denies fevers, body aches, headaches, nasal congestion, rhinorrhea, sore throat, loss of sense of taste or smell, cough, shortness of breath, nausea, vomiting, diarrhea, abdominal pain.  He got 3 doses of the COVID-vaccine.  He has a past medical history of cancer and is currently on chemotherapy.   Past Medical History:  Diagnosis Date   Arthritis    OSTEOARTHRITIS   Cancer ([place])    Cavitary lesion of lung    RIGHT LOWER LOBE   Chicken pox    Colon cancer ([facility])    History of kidney stones    Hypertension    Lipoma of colon    Nephrolithiasis    Nephrolithiasis    Obesity    Shingles    Tubular adenoma of colon    multiple fragments    Past Surgical History:  Procedure Laterality Date   COLON SURGERY     COLONOSCOPY N/A 10/02/2014   Procedure: COLONOSCOPY;  Surgeon: Josefine Class, MD;  Location: [facility] ENDOSCOPY;  Service: Endoscopy;  Laterality: N/A;   COLONOSCOPY WITH PROPOFOL N/A 02/16/2017   Procedure: COLONOSCOPY WITH PROPOFOL;  Surgeon: Manya Silvas, MD;  Location: [facility] ENDOSCOPY;  Service: Endoscopy;  Laterality: N/A;   COLONOSCOPY WITH PROPOFOL N/A 10/05/2020   Procedure: COLONOSCOPY WITH PROPOFOL;  Surgeon: Lesly Rubenstein, MD;  Location: [facility] ENDOSCOPY;  Service: Endoscopy;  Laterality: N/A;   EUS N/A 04/17/2019   Procedure: FULL UPPER ENDOSCOPIC ULTRASOUND (EUS) RADIAL;  Surgeon: Holly Bodily, MD;  Location: [facility] ENDOSCOPY;  Service: Gastroenterology;  Laterality: N/A;   KIDNEY STONE SURGERY     PARTIAL COLECTOMY  10/17/2013   PORTACATH PLACEMENT Right 06/13/2019   Procedure: INSERTION PORT-A-CATH;  Surgeon: Benjamine Sprague, DO;  Location: [facility] ORS;  Service: General;  Laterality: Right;    Family History  Problem Relation Age of Onset   Cancer Mother    Breast cancer Mother    COPD Father      Social History   Tobacco Use   Smoking status: Never   Smokeless tobacco: Never  Vaping Use   Vaping Use: Never used  Substance Use Topics   Alcohol use: Yes    Comment: socially    Drug use: No    No current facility-administered medications for this encounter.  Current Outpatient Medications:    acetaminophen (TYLENOL) 500 MG tablet, Take 500 mg by mouth every 6 (six) hours as needed., Disp: , Rfl:    amLODipine (NORVASC) 2.5 MG tablet, Take 2.5 mg by mouth daily., Disp: , Rfl:    aspirin EC 81 MG tablet, Take 81 mg by mouth daily., Disp: , Rfl:    Cholecalciferol (VITAMIN D) 125 MCG (5000 UT) CAPS, Take 5,000 mg by mouth., Disp: , Rfl:    docusate sodium (COLACE) 100 MG capsule, Take 100 mg by mouth 2 (two) times daily., Disp: , Rfl:    gabapentin (NEURONTIN) 100 MG capsule, Take 1 capsule (100 mg total) by mouth 3 (three) times daily., Disp: 90 capsule, Rfl: 0   HYDROcodone-acetaminophen (NORCO/VICODIN) 5-325 MG tablet, Take 1 tablet by mouth every 6 (six) hours as needed for moderate pain (up to 3 doses for moderate pain.)., Disp: , Rfl:    KLOR-CON M20 20 MEQ tablet, TAKE 1 TABLET BY MOUTH TWICE A DAY, Disp: 60 tablet, Rfl: 0  lidocaine-prilocaine (EMLA) cream, Apply to affected area once, Disp: 30 g, Rfl: 3   loratadine (CLARITIN) 10 MG tablet, Take 10 mg by mouth daily., Disp: , Rfl:    NEOMYCIN-POLYMYXIN-HYDROCORTISONE (CORTISPORIN) 1 % SOLN OTIC solution, SMARTSIG:Left Ear (Patient not taking: Reported on 05/03/2021), Disp: , Rfl:    olmesartan (BENICAR) 20 MG tablet, Take 1 tablet by mouth daily., Disp: , Rfl:    ondansetron (ZOFRAN) 8 MG tablet, Take 1 tablet (8 mg total) by mouth 2 (two) times daily as needed for refractory nausea / vomiting. Start on day 3 after chemotherapy., Disp: 60 tablet, Rfl: 1   Probiotic Product (PROBIOTIC DAILY PO), Take 1 tablet by mouth daily., Disp: , Rfl:    prochlorperazine (COMPAZINE) 10 MG tablet, Take 1 tablet (10 mg total) by mouth  every 6 (six) hours as needed (NAUSEA)., Disp: 30 tablet, Rfl: 1  Facility-Administered Medications Ordered in Other Encounters:    0.9 %  sodium chloride infusion, , Intravenous, Once, Corcoran, Melissa C, MD   0.9 %  sodium chloride infusion, , Intravenous, Continuous, Corcoran, Melissa C, MD, Last Rate: 10 mL/hr at 12/31/19 1000, New Bag at 10/05/20 1045   heparin lock flush 100 unit/mL, 500 Units, Intravenous, Once, Corcoran, Melissa C, MD  No Known Allergies   ROS  As noted in HPI.   Physical Exam  BP 137/77    Pulse 73    Temp 98.2 F (36.8 C)    Resp 16    SpO2 100%   Constitutional: Well developed, well nourished, no acute distress Eyes:  EOMI, conjunctiva normal bilaterally HENT: Normocephalic, atraumatic,mucus membranes moist Respiratory: Normal inspiratory effort lungs clear bilaterally Cardiovascular: Normal rate, regular rhythm GI: nondistended skin: No rash, skin intact Musculoskeletal: no deformities Neurologic: Alert & oriented x 3, no focal neuro deficits Psychiatric: Speech and behavior appropriate   ED Course   Medications - No data to display  Orders Placed This Encounter  Procedures   SARS CORONAVIRUS 2 (TAT 6-24 HRS) Nasopharyngeal Nasopharyngeal Swab    Standing Status:   Standing    Number of Occurrences:   1    No results found for this or any previous visit (from the past 24 hour(s)). No results found.  ED Clinical Impression  1. Exposure to COVID-19 virus   2. Encounter for laboratory testing for COVID-19 virus      ED Assessment/Plan  COVID PCR sent.  If positive, patient needs to be prescribed Molnupiravir because of history of cancer, currently on chemotherapy.  He would like for [place] to call us at his home number [phone].  No orders of the defined types were placed in this encounter.     *This clinic note was created using Dragon dictation software. Therefore, there may be occasional mistakes despite careful  proofreading.  ?    Melynda Ripple, MD 06/28/21 1127

## 2021-06-28 NOTE — Discharge Instructions (Signed)
Your COVID test will be back in 6 to 24 hours.  Someone will contact you if your COVID is positive and we will prescribe molnupiravir for you.  This is a drug that will help reduce the chance that you get very sick from [place] and hopefully keep you out of the hospital.

## 2021-06-28 NOTE — Telephone Encounter (Signed)
Per [place] from Dr. B: Wife called this morning that the patient has been exposed to Covid. Patient is asymptomatic. Recommend Covid testing. I asked patient to hold off his appointment for chemo today. Please follow up. Thank you.   Please cancel appt today and dc pump  on 2/16. reschedule tx and dc pump to next week. Please ask wife to inform us if pt is COVID + and if so, he will need to be r/s 2 weeks out.

## 2021-06-28 NOTE — Telephone Encounter (Signed)
Left Vm for pt and wife of new updated appts. I also asked them to let us know if pt tested positive for covid so we can move appts out another week. Encouraged to call back if they had any questions

## 2021-06-30 ENCOUNTER — Inpatient Hospital Stay: Payer: Medicare Other

## 2021-07-06 ENCOUNTER — Encounter: Payer: Self-pay | Admitting: Oncology

## 2021-07-06 ENCOUNTER — Inpatient Hospital Stay: Payer: Medicare Other | Attending: Oncology

## 2021-07-06 ENCOUNTER — Inpatient Hospital Stay: Payer: Medicare Other

## 2021-07-06 ENCOUNTER — Inpatient Hospital Stay (HOSPITAL_BASED_OUTPATIENT_CLINIC_OR_DEPARTMENT_OTHER): Payer: Medicare Other | Admitting: Oncology

## 2021-07-06 ENCOUNTER — Other Ambulatory Visit: Payer: Self-pay

## 2021-07-06 VITALS — BP 138/74 | HR 92 | Temp 96.7°F | Wt 259.0 lb

## 2021-07-06 VITALS — Resp 18

## 2021-07-06 DIAGNOSIS — C184 Malignant neoplasm of transverse colon: Secondary | ICD-10-CM | POA: Insufficient documentation

## 2021-07-06 DIAGNOSIS — T451X5D Adverse effect of antineoplastic and immunosuppressive drugs, subsequent encounter: Secondary | ICD-10-CM | POA: Diagnosis not present

## 2021-07-06 DIAGNOSIS — Z5111 Encounter for antineoplastic chemotherapy: Secondary | ICD-10-CM

## 2021-07-06 DIAGNOSIS — C786 Secondary malignant neoplasm of retroperitoneum and peritoneum: Secondary | ICD-10-CM | POA: Diagnosis not present

## 2021-07-06 DIAGNOSIS — Z79899 Other long term (current) drug therapy: Secondary | ICD-10-CM | POA: Insufficient documentation

## 2021-07-06 DIAGNOSIS — Z803 Family history of malignant neoplasm of breast: Secondary | ICD-10-CM | POA: Diagnosis not present

## 2021-07-06 DIAGNOSIS — C189 Malignant neoplasm of colon, unspecified: Secondary | ICD-10-CM

## 2021-07-06 DIAGNOSIS — Z809 Family history of malignant neoplasm, unspecified: Secondary | ICD-10-CM | POA: Insufficient documentation

## 2021-07-06 DIAGNOSIS — E669 Obesity, unspecified: Secondary | ICD-10-CM | POA: Insufficient documentation

## 2021-07-06 DIAGNOSIS — Z86010 Personal history of colonic polyps: Secondary | ICD-10-CM | POA: Diagnosis not present

## 2021-07-06 DIAGNOSIS — Z5112 Encounter for antineoplastic immunotherapy: Secondary | ICD-10-CM | POA: Insufficient documentation

## 2021-07-06 DIAGNOSIS — Z85038 Personal history of other malignant neoplasm of large intestine: Secondary | ICD-10-CM

## 2021-07-06 DIAGNOSIS — G620 Drug-induced polyneuropathy: Secondary | ICD-10-CM | POA: Insufficient documentation

## 2021-07-06 DIAGNOSIS — Z836 Family history of other diseases of the respiratory system: Secondary | ICD-10-CM | POA: Diagnosis not present

## 2021-07-06 LAB — CBC WITH DIFFERENTIAL/PLATELET
Abs Immature Granulocytes: 0.04 10*3/uL (ref 0.00–0.07)
Basophils Absolute: 0.1 10*3/uL (ref 0.0–0.1)
Basophils Relative: 1 %
Eosinophils Absolute: 0.3 10*3/uL (ref 0.0–0.5)
Eosinophils Relative: 5 %
HCT: 39.1 % (ref 39.0–52.0)
Hemoglobin: 13 g/dL (ref 13.0–17.0)
Immature Granulocytes: 1 %
Lymphocytes Relative: 28 %
Lymphs Abs: 1.3 10*3/uL (ref 0.7–4.0)
MCH: 32.2 pg (ref 26.0–34.0)
MCHC: 33.2 g/dL (ref 30.0–36.0)
MCV: 96.8 fL (ref 80.0–100.0)
Monocytes Absolute: 0.4 10*3/uL (ref 0.1–1.0)
Monocytes Relative: 8 %
Neutro Abs: 2.8 10*3/uL (ref 1.7–7.7)
Neutrophils Relative %: 57 %
Platelets: 189 10*3/uL (ref 150–400)
RBC: 4.04 MIL/uL — ABNORMAL LOW (ref 4.22–5.81)
RDW: 13.3 % (ref 11.5–15.5)
WBC: 4.9 10*3/uL (ref 4.0–10.5)
nRBC: 0 % (ref 0.0–0.2)

## 2021-07-06 LAB — COMPREHENSIVE METABOLIC PANEL
ALT: 38 U/L (ref 0–44)
AST: 61 U/L — ABNORMAL HIGH (ref 15–41)
Albumin: 3.5 g/dL (ref 3.5–5.0)
Alkaline Phosphatase: 38 U/L (ref 38–126)
Anion gap: 10 (ref 5–15)
BUN: 11 mg/dL (ref 8–23)
CO2: 24 mmol/L (ref 22–32)
Calcium: 9.3 mg/dL (ref 8.9–10.3)
Chloride: 104 mmol/L (ref 98–111)
Creatinine, Ser: 0.76 mg/dL (ref 0.61–1.24)
GFR, Estimated: >60 mL/min (ref >60)
Glucose, Bld: 143 mg/dL — ABNORMAL HIGH (ref 70–99)
Potassium: 3.8 mmol/L (ref 3.5–5.1)
Sodium: 138 mmol/L (ref 135–145)
Total Bilirubin: 0.9 mg/dL (ref 0.3–1.2)
Total Protein: 6.9 g/dL (ref 6.5–8.1)

## 2021-07-06 LAB — PROTEIN, URINE, RANDOM: Total Protein, Urine: 13 mg/dL

## 2021-07-06 MED ORDER — SODIUM CHLORIDE 0.9 % IV SOLN
10.0000 mg | Freq: Once | INTRAVENOUS | Status: AC
  Administered 2021-07-06: 10 mg via INTRAVENOUS
  Filled 2021-07-06: qty 10

## 2021-07-06 MED ORDER — SODIUM CHLORIDE 0.9 % IV SOLN
2400.0000 mg/m2 | INTRAVENOUS | Status: DC
  Filled 2021-07-06: qty 114

## 2021-07-06 MED ORDER — SODIUM CHLORIDE 0.9 % IV SOLN
401.0000 mg/m2 | Freq: Once | INTRAVENOUS | Status: AC
  Administered 2021-07-06: 950 mg via INTRAVENOUS
  Filled 2021-07-06: qty 47.5

## 2021-07-06 MED ORDER — FLUOROURACIL CHEMO INJECTION 2.5 GM/50ML
400.0000 mg/m2 | Freq: Once | INTRAVENOUS | Status: AC
  Administered 2021-07-06: 950 mg via INTRAVENOUS
  Filled 2021-07-06: qty 19

## 2021-07-06 MED ORDER — SODIUM CHLORIDE 0.9% FLUSH
10.0000 mL | INTRAVENOUS | Status: DC | PRN
  Administered 2021-07-06: 10 mL via INTRAVENOUS
  Filled 2021-07-06: qty 10

## 2021-07-06 MED ORDER — SODIUM CHLORIDE 0.9 % IV SOLN
2400.0000 mg/m2 | INTRAVENOUS | Status: DC
  Administered 2021-07-06: 5700 mg via INTRAVENOUS
  Filled 2021-07-06: qty 114

## 2021-07-06 MED ORDER — SODIUM CHLORIDE 0.9 % IV SOLN
Freq: Once | INTRAVENOUS | Status: AC
  Filled 2021-07-06: qty 250

## 2021-07-06 MED ORDER — SODIUM CHLORIDE 0.9 % IV SOLN
5.0000 mg/kg | Freq: Once | INTRAVENOUS | Status: AC
  Administered 2021-07-06: 600 mg via INTRAVENOUS
  Filled 2021-07-06: qty 16

## 2021-07-06 MED ORDER — SODIUM CHLORIDE 0.9% FLUSH
10.0000 mL | INTRAVENOUS | Status: DC | PRN
  Administered 2021-07-06 (×2): 10 mL
  Filled 2021-07-06: qty 10

## 2021-07-06 MED ORDER — HEPARIN SOD (PORK) LOCK FLUSH 100 UNIT/ML IV SOLN
500.0000 [IU] | Freq: Once | INTRAVENOUS | Status: DC | PRN
  Filled 2021-07-06: qty 5

## 2021-07-06 NOTE — Patient Instructions (Signed)
[facility] [place]   Discharge Instructions: Thank you for choosing [facility] to provide your oncology and hematology care.  If you have a lab appointment with the [facility], please go directly to the [facility] and check in at the registration area.   Wear comfortable clothing and clothing appropriate for easy access to any Portacath or PICC line.   We strive to give you quality time with your provider. You may need to reschedule your appointment if you arrive late (15 or more minutes).  Arriving late affects you and other patients whose appointments are after yours.  Also, if you miss three or more appointments without notifying the office, you may be dismissed from the clinic at the providers discretion.      For prescription refill requests, have your pharmacy contact our office and allow 72 hours for refills to be completed.    Today you received the following chemotherapy and/or immunotherapy agents: Zirabev, Leucovorin, and Fluorouracil.      To help prevent nausea and vomiting after your treatment, we encourage you to take your nausea medication as directed.  BELOW ARE SYMPTOMS THAT SHOULD BE REPORTED IMMEDIATELY: *FEVER GREATER THAN 100.4 F (38 C) OR HIGHER *CHILLS OR SWEATING *NAUSEA AND VOMITING THAT IS NOT CONTROLLED WITH YOUR NAUSEA MEDICATION *UNUSUAL SHORTNESS OF BREATH *UNUSUAL BRUISING OR BLEEDING *URINARY PROBLEMS (pain or burning when urinating, or frequent urination) *BOWEL PROBLEMS (unusual diarrhea, constipation, pain near the anus) TENDERNESS IN MOUTH AND THROAT WITH OR WITHOUT PRESENCE OF ULCERS (sore throat, sores in mouth, or a toothache) UNUSUAL RASH, SWELLING OR PAIN  UNUSUAL VAGINAL DISCHARGE OR ITCHING   Items with * indicate a potential emergency and should be followed up as soon as possible or go to the Emergency Department if any problems should occur.  Please show the CHEMOTHERAPY ALERT CARD or  IMMUNOTHERAPY ALERT CARD at check-in to the Emergency Department and triage nurse.  Should you have questions after your visit or need to cancel or reschedule your appointment, please contact [facility] [place]  Dept: [phone]  and follow the prompts.  Office hours are 8:00 a.m. to 4:30 p.m. Monday - Friday. Please note that voicemails left after 4:00 p.m. may not be returned until the following business day.  We are closed weekends and major holidays. You have access to a nurse at all times for urgent questions. Please call the main number to the clinic Dept: [phone] and follow the prompts.  For any non-urgent questions, you may also contact your provider using MyChart. We now offer e-Visits for anyone 95 and older to request care online for non-urgent symptoms. For details visit [email or web address].   Also download the MyChart app! Go to the app store, search "MyChart", open the app, select [facility], and log in with your MyChart username and password.  Due to Covid, a mask is required upon entering the hospital/clinic. If you do not have a mask, one will be given to you upon arrival. For doctor visits, patients may have 1 support person aged 63 or older with them. For treatment visits, patients cannot have anyone with them due to current Covid guidelines and our immunocompromised population.

## 2021-07-06 NOTE — Progress Notes (Signed)
Patient here for follow up. Patient was recently tested for COVID and results were negative.

## 2021-07-06 NOTE — Progress Notes (Signed)
Hematology/Oncology Progress note [phone] [phone]      Clinic Day: 07/06/2021   Referring physician: Tracie Harrier, MD  Chief Complaint: Jared Gutierrez is a 81 y.o. male presents for follow-up of metastatic colon cancer   PERTINENT ONCOLOGY HISTORY Jared Gutierrez is a 81 y.o.[place] who has above oncology history reviewed by me today presented for follow up visit for management of  Metastatic colon cancer. [name], patient switched care to me on 11/11/20 Extensive medical record review was performed by me  # Metastatic colon cancer.  he was diagnosed with stage I colon cancer s/p transverse colectomy on 10/17/2013.  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  Tumor extended into the submucosa. Margins were negative. + lymphovascular invasion. 14 lymph nodes were negative. Pathologic stage was T1 N0.   10/02/2014 Colonoscopy showed several 3 mm polyps and an 8 mm polyp in the cecum and transverse colon.  Pathology revealed tubular adenomas negative for high-grade dysplasia and malignancy.  02/16/2017 Colonoscopy  revealed 2 diminutive polyps in the descending colon.  Pathology revealed tubular adenomas without dysplasia or malignancy.  Her CEA has been monitored and was noted to increase starting July 2020. 04/02/2019  PET scan  limited evealed a 2.4 x 2.3 cm (SUV 11) hypermetabolic soft tissue density caudal and anterior to the pancreatic neck favored to represent isolated peritoneal or nodal metastasis in the setting of prior transverse colonic resection (expected primary drainage). Although this was immediately adjacent to the pancreas, a fat plane was maintained, arguing strongly against a pancreatic primary. Otherwise, there was no evidence of hypermetabolic metastasis.   04/17/2019 EUS on  revealed a normal  esophagus, stomach, duodenum, and pancreas.  There was a 2.4 x 2.4 cm irregular mass in the retroperitoneum adjacent to, but not involving the pancreatic neck.  FNA and core needle biopsy were performed.  Pathology revealed adenocarcinoma compatible with a metastatic lesion of colorectal origin.  Tumor cells were positive for CK20 and CDX2 and negative for CK7.   NGS: [employer] on 05/15/2019 revealed + KRASG13D and TP53.  Negative results included BRAF V600E, Her2, NRAS, NTRK, PD-L1 (<1%), and TMB 8.7/Mb (intermediate).  MMR testing from his colon resection on 10/16/2013 was intact with a low probability of MSI-H.  06/25/2019, CEA 17.8. 07/09/2019 - 08/27/2019; 09/17/2019 - 10/15/2019; 11/12/2019 - 12/17/2019 He received 11 cycles of FOLFOX chemotherapy and 1 cycle of 5-FU and leucovorin (12/31/2019).  He received Neulasta after cycle #4 and #5 secondary to progressive leukopenia.  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  He received a truncated course of FOLFOX with cycle #11 secondary to oxaliplatin reaction.  08/27/2019, CEA 6.1 10/29/2019, CEA 7.7 12/31/2019, CEA 10.8.  01/14/2020 PET revealed an interval decrease in size and FDG uptake (2.4 x 2.3 cm with SUV 11 to 2.4 x 1.7 cm with SUV 4.09) associated with the previously referenced soft tissue density caudal and anterior to the pancreatic neck suggesting treatment response. There were no new sites of FDG avid tumor.  Radiation treatment 02/10/2020 - 02/23/2020.   He received a hybrid 3-dimensional course of 3000 cGy in 10 fractions to the residual FDG avid mass   05/27/2020, CEA 11.6 05/27/2020 Abdomen and pelvis CT :stable to minimally increased (2.9 x 1.8 cm compared to 2.4 x 1.7 cm) since 01/14/2020.  There were no new interval findings.    08/31/2020, CEA 30.9. 08/31/2020 Abdomen  and pelvis CT revealed increased size of the index soft tissue lesion inferior and anterior to the pancreatic neck (2.9 x 1.8 cm to 3.5 x 2.9 cm). There  were similar prominent retroperitoneal lymph nodes without adenopathy by size criteria. There was no new or enlarging abdominal or pelvic lymph nodes. There were no new interval findings. There was similar circumferential wall thickening of a nondistended urinary bladder, which likely accentuated wall thickening There was hepatic steatosis and aortic atherosclerosis.  11/03/2020, PET showed recurrent peritoneal metastasis in the upper abdomen adjacent to the pancreas.  The lesion is 3.8 x 2.9 cm with SUV of 11.3. No evidence of metastatic peritoneal disease elsewhere in the abdomen pelvis.  No evidence of distant metastasis.  Other medical problems Chronic lower extremity edema. 12/24/2018, right lower extremity duplex negative for DVT.  Small right Baker's cyst. 08/16/2019, bilateral lower extremity duplex showed no DVT.  08/16/2019 - 08/17/2019 with right lower extremity cellulitis.  He was unable to bear weight.  He was treated with IV fluids, NSAIDs, colchicine, and broad antibiotics (vancomycin and Cefepime).  He was discharged on indomethacin x 5 days and Keflex 500 mg TID x 5 days.  10/05/2020, colonoscopy showed internal hemorrhoids.  Otherwise normal examination. 11/03/2020 PET scan showed recurrent peritoneal metastasis near pancreas. Given that he has no other distant metastasis.  Repeat SBRT may be considered if he does not respond well to systemic chemotherapy.  Discussed with radiation oncology.  11/26/2020 patient was started on 5-FU and bevacizumab. 12/14/2020 CEA 29.5 02/17/2021 CT abdomen pelvis showed partial response. Mild decrease of peritoneal lesion.  Proceed with 5-FU and bevacizumab today.  Given that he is tolerating current regimen with good life quality, partial response.  Shared decision was made to hold off adding additional agents.  INTERVAL HISTORY Jared Gutierrez is a 81 y.o. male who has above history reviewed by me today presents for follow up visit for management of  recurrent metastatic colon cancer He was accompanied by wife Recent COVID 19 exposure, asymptomatic and tested negative.  Patient tolerates current chemotherapy.  Denies nausea vomiting diarrhea.  No new complaints.   Review of Systems  Constitutional:  Negative for appetite change, chills, diaphoresis, fever and unexpected weight change.  HENT:   Negative for hearing loss, nosebleeds, sore throat and tinnitus.   Respiratory:  Negative for cough, hemoptysis and shortness of breath.   Cardiovascular:  Positive for leg swelling. Negative for chest pain and palpitations.  Gastrointestinal:  Negative for abdominal pain, blood in stool, constipation, diarrhea, nausea and vomiting.  Genitourinary:  Negative for dysuria, frequency and hematuria.   Musculoskeletal:  Positive for arthralgias. Negative for back pain, myalgias and neck pain.  Skin:  Negative for itching and rash.  Neurological:  Negative for dizziness and headaches.  Hematological:  Does not bruise/bleed easily.  Psychiatric/Behavioral:  Negative for depression. The patient is not nervous/anxious.      Past Medical History:  Diagnosis Date   Arthritis    OSTEOARTHRITIS   Cancer ([place])    Cavitary lesion of lung    RIGHT LOWER LOBE   Chicken pox    Colon cancer ([place])    History of kidney stones    Hypertension    Lipoma of colon    Nephrolithiasis    Nephrolithiasis    Obesity    Shingles    Tubular adenoma of colon    multiple fragments    Past Surgical History:  Procedure Laterality Date   COLON SURGERY  COLONOSCOPY N/A 10/02/2014   Procedure: COLONOSCOPY;  Surgeon: Josefine Class, MD;  Location: [facility] ENDOSCOPY;  Service: Endoscopy;  Laterality: N/A;   COLONOSCOPY WITH PROPOFOL N/A 02/16/2017   Procedure: COLONOSCOPY WITH PROPOFOL;  Surgeon: Manya Silvas, MD;  Location: [facility] ENDOSCOPY;  Service: Endoscopy;  Laterality: N/A;   COLONOSCOPY WITH PROPOFOL N/A 10/05/2020   Procedure: COLONOSCOPY WITH  PROPOFOL;  Surgeon: Lesly Rubenstein, MD;  Location: [facility] ENDOSCOPY;  Service: Endoscopy;  Laterality: N/A;   EUS N/A 04/17/2019   Procedure: FULL UPPER ENDOSCOPIC ULTRASOUND (EUS) RADIAL;  Surgeon: Holly Bodily, MD;  Location: [facility] ENDOSCOPY;  Service: Gastroenterology;  Laterality: N/A;   KIDNEY STONE SURGERY     PARTIAL COLECTOMY  10/17/2013   PORTACATH PLACEMENT Right 06/13/2019   Procedure: INSERTION PORT-A-CATH;  Surgeon: Benjamine Sprague, DO;  Location: [facility] ORS;  Service: General;  Laterality: Right;    Family History  Problem Relation Age of Onset   Cancer Mother    Breast cancer Mother    COPD Father     Social History:  reports that he has never smoked. He has never used smokeless tobacco. He reports current alcohol use. He reports that he does not use drugs. 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 He is married and his wife's name is [name] [phone]).    Allergies: No Known Allergies  Current Medications: Current Outpatient Medications  Medication Sig Dispense Refill   acetaminophen (TYLENOL) 500 MG tablet Take 500 mg by mouth every 6 (six) hours as needed.     amLODipine (NORVASC) 2.5 MG tablet Take 2.5 mg by mouth daily.     aspirin EC 81 MG tablet Take 81 mg by mouth daily.     Cholecalciferol (VITAMIN D) 125 MCG (5000 UT) CAPS Take 5,000 mg by mouth.     docusate sodium (COLACE) 100 MG capsule Take 100 mg by mouth 2 (two) times daily.     gabapentin (NEURONTIN) 100 MG capsule Take 1 capsule (100 mg total) by mouth 3 (three) times daily. 90 capsule 0   HYDROcodone-acetaminophen (NORCO/VICODIN) 5-325 MG tablet Take 1 tablet by mouth every 6 (six) hours as needed for moderate pain (up to 3 doses for moderate pain.).     KLOR-CON M20 20 MEQ tablet TAKE 1 TABLET BY MOUTH TWICE A DAY 60 tablet 0   lidocaine-prilocaine (EMLA) cream Apply to affected area once 30 g 3   loratadine (CLARITIN) 10 MG tablet Take 10 mg by mouth daily.      NEOMYCIN-POLYMYXIN-HYDROCORTISONE (CORTISPORIN) 1 % SOLN OTIC solution SMARTSIG:Left Ear (Patient not taking: Reported on 05/03/2021)     olmesartan (BENICAR) 20 MG tablet Take 1 tablet by mouth daily.     ondansetron (ZOFRAN) 8 MG tablet Take 1 tablet (8 mg total) by mouth 2 (two) times daily as needed for refractory nausea / vomiting. Start on day 3 after chemotherapy. 60 tablet 1   Probiotic Product (PROBIOTIC DAILY PO) Take 1 tablet by mouth daily.     prochlorperazine (COMPAZINE) 10 MG tablet Take 1 tablet (10 mg total) by mouth every 6 (six) hours as needed (NAUSEA). 30 tablet 1   No current facility-administered medications for this visit.   Facility-Administered Medications Ordered in Other Visits  Medication Dose Route Frequency Provider Last Rate Last Admin   0.9 %  sodium chloride infusion   Intravenous Once Corcoran, Melissa C, MD       0.9 %  sodium  chloride infusion   Intravenous Continuous Lequita Asal, MD 10 mL/hr at 12/31/19 1000 New Bag at 10/05/20 1045   bevacizumab-bvzr (ZIRABEV) 600 mg in sodium chloride 0.9 % 100 mL chemo infusion  5 mg/kg (Order-Specific) Intravenous Once [name], MD       dexamethasone (DECADRON) 10 mg in sodium chloride 0.9 % 50 mL IVPB  10 mg Intravenous Once [name], MD 204 mL/hr at 07/06/21 1034 10 mg at 07/06/21 1034   fluorouracil (ADRUCIL) 5,700 mg in sodium chloride 0.9 % 136 mL chemo infusion  2,400 mg/m2 (Order-Specific) Intravenous 1 day or 1 dose [name], MD       fluorouracil (ADRUCIL) chemo injection 950 mg  400 mg/m2 (Order-Specific) Intravenous Once [name], MD       heparin lock flush 100 unit/mL  500 Units Intravenous Once Lequita Asal, MD       heparin lock flush 100 unit/mL  500 Units Intracatheter Once PRN [name], MD       leucovorin 950 mg in sodium chloride 0.9 % 250 mL infusion  401 mg/m2 (Order-Specific) Intravenous Once [name], MD       sodium chloride flush (NS) 0.9 % injection 10 mL  10 mL Intracatheter PRN  [name], MD   10 mL at 07/06/21 1023    Performance status (ECOG): 1  Vitals Blood pressure 138/74, pulse 92, temperature (!) 96.7 F (35.9 C), temperature source Tympanic, weight 259 lb (117.5 kg).   Physical Exam Vitals and nursing note reviewed.  Constitutional:      General: He is not in acute distress.    Appearance: He is well-developed. He is obese. He is not diaphoretic.     Interventions: Face mask in place.     Comments: Patient walks with a cane  HENT:     Head: Normocephalic and atraumatic.     Mouth/Throat:     Mouth: Mucous membranes are moist.     Pharynx: Oropharynx is clear.  Eyes:     General: No scleral icterus.    Extraocular Movements: Extraocular movements intact.     Conjunctiva/sclera: Conjunctivae normal.     Pupils: Pupils are equal, round, and reactive to light.  Cardiovascular:     Rate and Rhythm: Normal rate and regular rhythm.     Heart sounds: Normal heart sounds. No murmur heard. Pulmonary:     Effort: Pulmonary effort is normal. No respiratory distress.     Breath sounds: No wheezing or rales.     Comments: Decreased breath sound bilaterally Chest:     Chest wall: No tenderness.  Abdominal:     General: Bowel sounds are normal. There is no distension.     Palpations: Abdomen is soft. There is no mass.     Tenderness: There is no abdominal tenderness. There is no guarding or rebound.  Musculoskeletal:        General: No swelling or tenderness. Normal range of motion.     Cervical back: Normal range of motion and neck supple.     Right lower leg: Edema present.     Left lower leg: Edema present.  Lymphadenopathy:     Head:     Right side of head: No preauricular, posterior auricular or occipital adenopathy.     Left side of head: No preauricular, posterior auricular or occipital adenopathy.     Cervical: No cervical adenopathy.     Upper Body:     Right upper body: No supraclavicular or  axillary adenopathy.     Left upper body: No  supraclavicular or axillary adenopathy.     Lower Body: No right inguinal adenopathy. No left inguinal adenopathy.  Skin:    General: Skin is warm and dry.  Neurological:     General: No focal deficit present.     Mental Status: He is alert and oriented to person, place, and time. Mental status is at baseline.  Psychiatric:        Mood and Affect: Mood normal.   Infusion on 07/06/2021  Component Date Value Ref Range Status   Sodium 07/06/2021 138  135 - 145 mmol/L Final   Potassium 07/06/2021 3.8  3.5 - 5.1 mmol/L Final   Chloride 07/06/2021 104  98 - 111 mmol/L Final   CO2 07/06/2021 24  22 - 32 mmol/L Final   Glucose, Bld 07/06/2021 143 (H)  70 - 99 mg/dL Final   Glucose reference range applies only to samples taken after fasting for at least 8 hours.   BUN 07/06/2021 11  8 - 23 mg/dL Final   Creatinine, Ser 07/06/2021 0.76  0.61 - 1.24 mg/dL Final   Calcium 07/06/2021 9.3  8.9 - 10.3 mg/dL Final   Total Protein 07/06/2021 6.9  6.5 - 8.1 g/dL Final   Albumin 07/06/2021 3.5  3.5 - 5.0 g/dL Final   AST 07/06/2021 61 (H)  15 - 41 U/L Final   ALT 07/06/2021 38  0 - 44 U/L Final   Alkaline Phosphatase 07/06/2021 38  38 - 126 U/L Final   Total Bilirubin 07/06/2021 0.9  0.3 - 1.2 mg/dL Final   GFR, Estimated 07/06/2021 >60  >60 mL/min Final   Comment: (NOTE) Calculated using the CKD-EPI Creatinine Equation (2021)    Anion gap 07/06/2021 10  5 - 15 Final   Performed at [facility], [place], [place] [address]   WBC 07/06/2021 4.9  4.0 - 10.5 K/uL Final   RBC 07/06/2021 4.04 (L)  4.22 - 5.81 MIL/uL Final   Hemoglobin 07/06/2021 13.0  13.0 - 17.0 g/dL Final   HCT 07/06/2021 39.1  39.0 - 52.0 % Final   MCV 07/06/2021 96.8  80.0 - 100.0 fL Final   MCH 07/06/2021 32.2  26.0 - 34.0 pg Final   MCHC 07/06/2021 33.2  30.0 - 36.0 g/dL Final   RDW 07/06/2021 13.3  11.5 - 15.5 % Final   Platelets 07/06/2021 189  150 - 400 K/uL Final   nRBC 07/06/2021 0.0  0.0 - 0.2 %  Final   Neutrophils Relative % 07/06/2021 57  % Final   Neutro Abs 07/06/2021 2.8  1.7 - 7.7 K/uL Final   Lymphocytes Relative 07/06/2021 28  % Final   Lymphs Abs 07/06/2021 1.3  0.7 - 4.0 K/uL Final   Monocytes Relative 07/06/2021 8  % Final   Monocytes Absolute 07/06/2021 0.4  0.1 - 1.0 K/uL Final   Eosinophils Relative 07/06/2021 5  % Final   Eosinophils Absolute 07/06/2021 0.3  0.0 - 0.5 K/uL Final   Basophils Relative 07/06/2021 1  % Final   Basophils Absolute 07/06/2021 0.1  0.0 - 0.1 K/uL Final   Immature Granulocytes 07/06/2021 1  % Final   Abs Immature Granulocytes 07/06/2021 0.04  0.00 - 0.07 K/uL Final   Performed at [facility], [address] [place], [place] [address]   Total Protein, Urine 07/06/2021 13  mg/dL Final   Comment: NO NORMAL RANGE ESTABLISHED FOR THIS TEST Performed at [facility], [address], [place], [place] [address]     Assessment and plan 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 Encounter for antineoplastic chemotherapy   3. Colon carcinoma ([place])    #Metastatic colon cancer-recurrent Overall he tolerates chemotherapy.   Labs reviewed and discussed with patient.  CEA has reached a nadir and he starts to trend up.  Today's level is pending. 06/08/2021, CT chest abdomen pelvis with contrast showed soft tissue mass at the base of mesentery inferior to the pancreatic neck is unchanged in size.  No progressive disease was identified. Labs reviewed and discussed with patient Proceed with 5-FU and bevacizumab today. CEA continues to trend up.  Plan PET scan in March 2023.  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  Patient has gabapentin supply at home.Patient declines gabapentin and acupuncture. Symptoms are manageable.   . Follow-up 2 weeks lab MD 5-FU/bevacizumab -   I discussed the assessment and treatment plan with the patient.  The patient was provided an opportunity to ask  questions and all were answered.  The patient agreed with the plan and demonstrated an understanding of the instructions.  The patient was advised to call back if the symptoms worsen or if the condition fails to improve as anticipated.  [name], MD, PhD 07/06/2021

## 2021-07-07 ENCOUNTER — Telehealth: Payer: Self-pay | Admitting: *Deleted

## 2021-07-07 LAB — CEA: CEA: 10.6 ng/mL — ABNORMAL HIGH (ref 0.0–4.7)

## 2021-07-07 NOTE — Telephone Encounter (Signed)
Patient wife called reporting that she and her family have tested positive for COVID again and is asking if she needs to have the patient tested for COVID. She states he has his chemotherapy pump on and is due to come off tomorrow and that she was the one to bring him to his appointment yesterday, but that she has not really been that close to him. Please advise

## 2021-07-08 ENCOUNTER — Other Ambulatory Visit: Payer: Self-pay

## 2021-07-08 ENCOUNTER — Inpatient Hospital Stay: Payer: Medicare Other

## 2021-07-08 DIAGNOSIS — C786 Secondary malignant neoplasm of retroperitoneum and peritoneum: Secondary | ICD-10-CM

## 2021-07-08 DIAGNOSIS — Z5112 Encounter for antineoplastic immunotherapy: Secondary | ICD-10-CM | POA: Diagnosis not present

## 2021-07-08 DIAGNOSIS — Z85038 Personal history of other malignant neoplasm of large intestine: Secondary | ICD-10-CM

## 2021-07-08 MED ORDER — SODIUM CHLORIDE 0.9% FLUSH
10.0000 mL | INTRAVENOUS | Status: DC | PRN
  Administered 2021-07-08: 10 mL
  Filled 2021-07-08: qty 10

## 2021-07-08 MED ORDER — HEPARIN SOD (PORK) LOCK FLUSH 100 UNIT/ML IV SOLN
500.0000 [IU] | Freq: Once | INTRAVENOUS | Status: AC | PRN
  Administered 2021-07-08: 500 [IU]
  Filled 2021-07-08: qty 5

## 2021-07-09 ENCOUNTER — Other Ambulatory Visit: Payer: Self-pay | Admitting: Oncology

## 2021-07-11 NOTE — Telephone Encounter (Signed)
°  Component Ref Range & Units 5 d ago (07/06/21) 1 mo ago (06/10/21) 1 mo ago (05/27/21) 1 mo ago (05/18/21) 2 mo ago (05/03/21) 2 mo ago (04/19/21) 3 mo ago (04/05/21)  Potassium 3.5 - 5.1 mmol/L 3.8  4.0  3.8  3.5  3.1 Low   3.4 Low   3.5

## 2021-07-12 ENCOUNTER — Encounter: Payer: Self-pay | Admitting: Oncology

## 2021-07-20 ENCOUNTER — Inpatient Hospital Stay (HOSPITAL_BASED_OUTPATIENT_CLINIC_OR_DEPARTMENT_OTHER): Payer: Medicare Other | Admitting: Oncology

## 2021-07-20 ENCOUNTER — Encounter: Payer: Self-pay | Admitting: Oncology

## 2021-07-20 ENCOUNTER — Other Ambulatory Visit: Payer: Self-pay

## 2021-07-20 ENCOUNTER — Inpatient Hospital Stay: Payer: Medicare Other

## 2021-07-20 ENCOUNTER — Inpatient Hospital Stay: Payer: Medicare Other | Attending: Oncology

## 2021-07-20 VITALS — BP 114/75 | HR 71 | Temp 96.8°F | Wt 255.0 lb

## 2021-07-20 DIAGNOSIS — Z5111 Encounter for antineoplastic chemotherapy: Secondary | ICD-10-CM | POA: Diagnosis present

## 2021-07-20 DIAGNOSIS — Z836 Family history of other diseases of the respiratory system: Secondary | ICD-10-CM | POA: Diagnosis not present

## 2021-07-20 DIAGNOSIS — G620 Drug-induced polyneuropathy: Secondary | ICD-10-CM | POA: Diagnosis not present

## 2021-07-20 DIAGNOSIS — D6481 Anemia due to antineoplastic chemotherapy: Secondary | ICD-10-CM | POA: Insufficient documentation

## 2021-07-20 DIAGNOSIS — Z79899 Other long term (current) drug therapy: Secondary | ICD-10-CM | POA: Insufficient documentation

## 2021-07-20 DIAGNOSIS — Z809 Family history of malignant neoplasm, unspecified: Secondary | ICD-10-CM | POA: Insufficient documentation

## 2021-07-20 DIAGNOSIS — C189 Malignant neoplasm of colon, unspecified: Secondary | ICD-10-CM

## 2021-07-20 DIAGNOSIS — C786 Secondary malignant neoplasm of retroperitoneum and peritoneum: Secondary | ICD-10-CM

## 2021-07-20 DIAGNOSIS — Z803 Family history of malignant neoplasm of breast: Secondary | ICD-10-CM | POA: Diagnosis not present

## 2021-07-20 DIAGNOSIS — C184 Malignant neoplasm of transverse colon: Secondary | ICD-10-CM | POA: Diagnosis present

## 2021-07-20 DIAGNOSIS — T451X5A Adverse effect of antineoplastic and immunosuppressive drugs, initial encounter: Secondary | ICD-10-CM | POA: Diagnosis not present

## 2021-07-20 DIAGNOSIS — Z85038 Personal history of other malignant neoplasm of large intestine: Secondary | ICD-10-CM

## 2021-07-20 DIAGNOSIS — E669 Obesity, unspecified: Secondary | ICD-10-CM | POA: Insufficient documentation

## 2021-07-20 DIAGNOSIS — Z5112 Encounter for antineoplastic immunotherapy: Secondary | ICD-10-CM | POA: Insufficient documentation

## 2021-07-20 DIAGNOSIS — Z86010 Personal history of colonic polyps: Secondary | ICD-10-CM | POA: Diagnosis not present

## 2021-07-20 DIAGNOSIS — T451X5D Adverse effect of antineoplastic and immunosuppressive drugs, subsequent encounter: Secondary | ICD-10-CM | POA: Insufficient documentation

## 2021-07-20 LAB — CBC WITH DIFFERENTIAL/PLATELET
Abs Immature Granulocytes: 0.01 10*3/uL (ref 0.00–0.07)
Basophils Absolute: 0 10*3/uL (ref 0.0–0.1)
Basophils Relative: 1 %
Eosinophils Absolute: 0.1 10*3/uL (ref 0.0–0.5)
Eosinophils Relative: 3 %
HCT: 37.3 % — ABNORMAL LOW (ref 39.0–52.0)
Hemoglobin: 12.4 g/dL — ABNORMAL LOW (ref 13.0–17.0)
Immature Granulocytes: 0 %
Lymphocytes Relative: 32 %
Lymphs Abs: 1.4 10*3/uL (ref 0.7–4.0)
MCH: 31.9 pg (ref 26.0–34.0)
MCHC: 33.2 g/dL (ref 30.0–36.0)
MCV: 95.9 fL (ref 80.0–100.0)
Monocytes Absolute: 0.4 10*3/uL (ref 0.1–1.0)
Monocytes Relative: 9 %
Neutro Abs: 2.4 10*3/uL (ref 1.7–7.7)
Neutrophils Relative %: 55 %
Platelets: 158 10*3/uL (ref 150–400)
RBC: 3.89 MIL/uL — ABNORMAL LOW (ref 4.22–5.81)
RDW: 13.8 % (ref 11.5–15.5)
WBC: 4.3 10*3/uL (ref 4.0–10.5)
nRBC: 0 % (ref 0.0–0.2)

## 2021-07-20 LAB — COMPREHENSIVE METABOLIC PANEL
ALT: 38 U/L (ref 0–44)
AST: 50 U/L — ABNORMAL HIGH (ref 15–41)
Albumin: 3.5 g/dL (ref 3.5–5.0)
Alkaline Phosphatase: 41 U/L (ref 38–126)
Anion gap: 6 (ref 5–15)
BUN: 11 mg/dL (ref 8–23)
CO2: 25 mmol/L (ref 22–32)
Calcium: 9.5 mg/dL (ref 8.9–10.3)
Chloride: 102 mmol/L (ref 98–111)
Creatinine, Ser: 0.69 mg/dL (ref 0.61–1.24)
GFR, Estimated: >60 mL/min (ref >60)
Glucose, Bld: 147 mg/dL — ABNORMAL HIGH (ref 70–99)
Potassium: 4 mmol/L (ref 3.5–5.1)
Sodium: 133 mmol/L — ABNORMAL LOW (ref 135–145)
Total Bilirubin: 1.2 mg/dL (ref 0.3–1.2)
Total Protein: 6.6 g/dL (ref 6.5–8.1)

## 2021-07-20 LAB — PROTEIN, URINE, RANDOM: Total Protein, Urine: 11 mg/dL

## 2021-07-20 MED ORDER — SODIUM CHLORIDE 0.9% FLUSH
10.0000 mL | INTRAVENOUS | Status: DC | PRN
  Administered 2021-07-20: 10 mL via INTRAVENOUS
  Filled 2021-07-20: qty 10

## 2021-07-20 MED ORDER — HEPARIN SOD (PORK) LOCK FLUSH 100 UNIT/ML IV SOLN
500.0000 [IU] | Freq: Once | INTRAVENOUS | Status: DC
  Filled 2021-07-20: qty 5

## 2021-07-20 MED ORDER — FLUOROURACIL CHEMO INJECTION 2.5 GM/50ML
400.0000 mg/m2 | Freq: Once | INTRAVENOUS | Status: AC
  Administered 2021-07-20: 950 mg via INTRAVENOUS
  Filled 2021-07-20: qty 19

## 2021-07-20 MED ORDER — SODIUM CHLORIDE 0.9 % IV SOLN
950.0000 mg | Freq: Once | INTRAVENOUS | Status: AC
  Administered 2021-07-20: 950 mg via INTRAVENOUS
  Filled 2021-07-20: qty 47.5

## 2021-07-20 MED ORDER — SODIUM CHLORIDE 0.9 % IV SOLN
2400.0000 mg/m2 | INTRAVENOUS | Status: DC
  Administered 2021-07-20: 5700 mg via INTRAVENOUS
  Filled 2021-07-20: qty 114

## 2021-07-20 MED ORDER — SODIUM CHLORIDE 0.9 % IV SOLN
5.0000 mg/kg | Freq: Once | INTRAVENOUS | Status: AC
  Administered 2021-07-20: 600 mg via INTRAVENOUS
  Filled 2021-07-20: qty 8

## 2021-07-20 MED ORDER — SODIUM CHLORIDE 0.9 % IV SOLN
Freq: Once | INTRAVENOUS | Status: AC
  Filled 2021-07-20: qty 250

## 2021-07-20 MED ORDER — SODIUM CHLORIDE 0.9 % IV SOLN
10.0000 mg | Freq: Once | INTRAVENOUS | Status: AC
  Administered 2021-07-20: 10 mg via INTRAVENOUS
  Filled 2021-07-20: qty 10

## 2021-07-20 NOTE — Progress Notes (Signed)
Per MD to proceed with treatment today and okay to use urine protein from the results on 07/06/21.  ? ?Jared Gutierrez  ?

## 2021-07-20 NOTE — Progress Notes (Signed)
Patient here for follow up. Patient states he had some blood in stool. ?

## 2021-07-20 NOTE — Progress Notes (Signed)
Hematology/Oncology Progress note [phone] [phone]      Clinic Day: 07/20/2021   Referring physician: Tracie Harrier, MD  Chief Complaint: Jared Gutierrez is a 81 y.o. male presents for follow-up of metastatic colon cancer   PERTINENT ONCOLOGY HISTORY Jared Gutierrez is a 81 y.o.[place] who has above oncology history reviewed by me today presented for follow up visit for management of  Metastatic colon cancer. [name], patient switched care to me on 11/11/20 Extensive medical record review was performed by me  # Metastatic colon cancer.  he was diagnosed with stage I colon cancer s/p transverse colectomy on 10/17/2013.  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  Tumor extended into the submucosa. Margins were negative. + lymphovascular invasion. 14 lymph nodes were negative. Pathologic stage was T1 N0.   10/02/2014 Colonoscopy showed several 3 mm polyps and an 8 mm polyp in the cecum and transverse colon.  Pathology revealed tubular adenomas negative for high-grade dysplasia and malignancy.  02/16/2017 Colonoscopy  revealed 2 diminutive polyps in the descending colon.  Pathology revealed tubular adenomas without dysplasia or malignancy.  Her CEA has been monitored and was noted to increase starting July 2020. 04/02/2019  PET scan  limited evealed a 2.4 x 2.3 cm (SUV 11) hypermetabolic soft tissue density caudal and anterior to the pancreatic neck favored to represent isolated peritoneal or nodal metastasis in the setting of prior transverse colonic resection (expected primary drainage). Although this was immediately adjacent to the pancreas, a fat plane was maintained, arguing strongly against a pancreatic primary. Otherwise, there was no evidence of hypermetabolic metastasis.   04/17/2019 EUS on  revealed a normal  esophagus, stomach, duodenum, and pancreas.  There was a 2.4 x 2.4 cm irregular mass in the retroperitoneum adjacent to, but not involving the pancreatic neck.  FNA and core needle biopsy were performed.  Pathology revealed adenocarcinoma compatible with a metastatic lesion of colorectal origin.  Tumor cells were positive for CK20 and CDX2 and negative for CK7.   NGS: [employer] on 05/15/2019 revealed + KRASG13D and TP53.  Negative results included BRAF V600E, Her2, NRAS, NTRK, PD-L1 (<1%), and TMB 8.7/Mb (intermediate).  MMR testing from his colon resection on 10/16/2013 was intact with a low probability of MSI-H.  06/25/2019, CEA 17.8. 07/09/2019 - 08/27/2019; 09/17/2019 - 10/15/2019; 11/12/2019 - 12/17/2019 He received 11 cycles of FOLFOX chemotherapy and 1 cycle of 5-FU and leucovorin (12/31/2019).  He received Neulasta after cycle #4 and #5 secondary to progressive leukopenia.  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  He received a truncated course of FOLFOX with cycle #11 secondary to oxaliplatin reaction.  08/27/2019, CEA 6.1 10/29/2019, CEA 7.7 12/31/2019, CEA 10.8.  01/14/2020 PET revealed an interval decrease in size and FDG uptake (2.4 x 2.3 cm with SUV 11 to 2.4 x 1.7 cm with SUV 4.09) associated with the previously referenced soft tissue density caudal and anterior to the pancreatic neck suggesting treatment response. There were no new sites of FDG avid tumor.  Radiation treatment 02/10/2020 - 02/23/2020.   He received a hybrid 3-dimensional course of 3000 cGy in 10 fractions to the residual FDG avid mass   05/27/2020, CEA 11.6 05/27/2020 Abdomen and pelvis CT :stable to minimally increased (2.9 x 1.8 cm compared to 2.4 x 1.7 cm) since 01/14/2020.  There were no new interval findings.    08/31/2020, CEA 30.9. 08/31/2020 Abdomen  and pelvis CT revealed increased size of the index soft tissue lesion inferior and anterior to the pancreatic neck (2.9 x 1.8 cm to 3.5 x 2.9 cm). There  were similar prominent retroperitoneal lymph nodes without adenopathy by size criteria. There was no new or enlarging abdominal or pelvic lymph nodes. There were no new interval findings. There was similar circumferential wall thickening of a nondistended urinary bladder, which likely accentuated wall thickening There was hepatic steatosis and aortic atherosclerosis.  11/03/2020, PET showed recurrent peritoneal metastasis in the upper abdomen adjacent to the pancreas.  The lesion is 3.8 x 2.9 cm with SUV of 11.3. No evidence of metastatic peritoneal disease elsewhere in the abdomen pelvis.  No evidence of distant metastasis.  Other medical problems Chronic lower extremity edema. 12/24/2018, right lower extremity duplex negative for DVT.  Small right Baker's cyst. 08/16/2019, bilateral lower extremity duplex showed no DVT.  08/16/2019 - 08/17/2019 with right lower extremity cellulitis.  He was unable to bear weight.  He was treated with IV fluids, NSAIDs, colchicine, and broad antibiotics (vancomycin and Cefepime).  He was discharged on indomethacin x 5 days and Keflex 500 mg TID x 5 days.  10/05/2020, colonoscopy showed internal hemorrhoids.  Otherwise normal examination. 11/03/2020 PET scan showed recurrent peritoneal metastasis near pancreas. Given that he has no other distant metastasis.  Repeat SBRT may be considered if he does not respond well to systemic chemotherapy.  Discussed with radiation oncology.  11/26/2020 patient was started on 5-FU and bevacizumab. 12/14/2020 CEA 29.5 02/17/2021 CT abdomen pelvis showed partial response. Mild decrease of peritoneal lesion.  Proceed with 5-FU and bevacizumab today.  Given that he is tolerating current regimen with good life quality, partial response.  Shared decision was made to hold off adding additional agents.  06/08/2021, CT chest abdomen pelvis with contrast showed soft tissue mass at the base of mesentery inferior to the pancreatic neck is unchanged  in size.  No progressive disease was identified. Labs reviewed and discussed with patient  INTERVAL HISTORY Jared Gutierrez is a 81 y.o. male who has above history reviewed by me today presents for follow up visit for management of recurrent metastatic colon cancer He was accompanied by wife Patient reports feeling well.  1 episode of small amount of blood in his stool after he strained.   Review of Systems  Constitutional:  Negative for appetite change, chills, diaphoresis, fever and unexpected weight change.  HENT:   Negative for hearing loss, nosebleeds, sore throat and tinnitus.   Respiratory:  Negative for cough, hemoptysis and shortness of breath.   Cardiovascular:  Positive for leg swelling. Negative for chest pain and palpitations.  Gastrointestinal:  Negative for abdominal pain, blood in stool, constipation, diarrhea, nausea and vomiting.  Genitourinary:  Negative for dysuria, frequency and hematuria.   Musculoskeletal:  Positive for arthralgias. Negative for back pain, myalgias and neck pain.  Skin:  Negative for itching and rash.  Neurological:  Negative for dizziness and headaches.  Hematological:  Does not bruise/bleed easily.  Psychiatric/Behavioral:  Negative for depression. The patient is not nervous/anxious.      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   Chicken pox    Colon cancer (HCC)    History of kidney stones    Hypertension    Lipoma of colon    Nephrolithiasis    Nephrolithiasis  Obesity    Shingles    Tubular adenoma of colon    multiple fragments    Past Surgical History:  Procedure Laterality Date   COLON SURGERY     COLONOSCOPY N/A 10/02/2014   Procedure: COLONOSCOPY;  Surgeon: Elnita Maxwell, MD;  Location: [facility] ENDOSCOPY;  Service: Endoscopy;  Laterality: N/A;   COLONOSCOPY WITH PROPOFOL N/A 02/16/2017   Procedure: COLONOSCOPY WITH PROPOFOL;   Surgeon: Scot Jun, MD;  Location: [facility] ENDOSCOPY;  Service: Endoscopy;  Laterality: N/A;   COLONOSCOPY WITH PROPOFOL N/A 10/05/2020   Procedure: COLONOSCOPY WITH PROPOFOL;  Surgeon: Regis Bill, MD;  Location: [facility] ENDOSCOPY;  Service: Endoscopy;  Laterality: N/A;   EUS N/A 04/17/2019   Procedure: FULL UPPER ENDOSCOPIC ULTRASOUND (EUS) RADIAL;  Surgeon: Bearl Mulberry, MD;  Location: [facility] ENDOSCOPY;  Service: Gastroenterology;  Laterality: N/A;   KIDNEY STONE SURGERY     PARTIAL COLECTOMY  10/17/2013   PORTACATH PLACEMENT Right 06/13/2019   Procedure: INSERTION PORT-A-CATH;  Surgeon: Sung Amabile, DO;  Location: [facility] ORS;  Service: General;  Laterality: Right;    Family History  Problem Relation Age of Onset   Cancer Mother    Breast cancer Mother    COPD Father     Social History:  reports that he has never smoked. He has never used smokeless tobacco. He reports current alcohol use. He reports that he does not use drugs. 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  He works in the garden every day. He is married and his wife's name is Bonita Quin [phone]).    Allergies: No Known Allergies  Current Medications: Current Outpatient Medications  Medication Sig Dispense Refill   acetaminophen (TYLENOL) 500 MG tablet Take 500 mg by mouth every 6 (six) hours as needed.     amLODipine (NORVASC) 2.5 MG tablet Take 2.5 mg by mouth daily.     aspirin EC 81 MG tablet Take 81 mg by mouth daily.     Cholecalciferol (VITAMIN D) 125 MCG (5000 UT) CAPS Take 5,000 mg by mouth.     docusate sodium (COLACE) 100 MG capsule Take 100 mg by mouth 2 (two) times daily.     gabapentin (NEURONTIN) 100 MG capsule Take 1 capsule (100 mg total) by mouth 3 (three) times daily. 90 capsule 0   HYDROcodone-acetaminophen (NORCO/VICODIN) 5-325 MG tablet Take 1 tablet by mouth every 6 (six) hours as needed for moderate pain (up to 3 doses for moderate pain.).     KLOR-CON M20 20 MEQ tablet TAKE 1 TABLET BY  MOUTH TWICE A DAY 60 tablet 0   lidocaine-prilocaine (EMLA) cream Apply to affected area once 30 g 3   loratadine (CLARITIN) 10 MG tablet Take 10 mg by mouth daily.     olmesartan (BENICAR) 20 MG tablet Take 1 tablet by mouth daily.     ondansetron (ZOFRAN) 8 MG tablet Take 1 tablet (8 mg total) by mouth 2 (two) times daily as needed for refractory nausea / vomiting. Start on day 3 after chemotherapy. 60 tablet 1   Probiotic Product (PROBIOTIC DAILY PO) Take 1 tablet by mouth daily.     prochlorperazine (COMPAZINE) 10 MG tablet Take 1 tablet (10 mg total) by mouth every 6 (six) hours as needed (NAUSEA). 30 tablet 1   NEOMYCIN-POLYMYXIN-HYDROCORTISONE (CORTISPORIN) 1 % SOLN OTIC solution SMARTSIG:Left Ear (Patient not taking: Reported on 05/03/2021)     No current facility-administered medications for this visit.   Facility-Administered Medications Ordered in Other Visits  Medication Dose Route Frequency Provider Last Rate Last Admin   0.9 %  sodium chloride infusion   Intravenous Once Corcoran, Melissa C, MD       0.9 %  sodium chloride infusion   Intravenous Continuous Lequita Asal, MD 10 mL/hr at 12/31/19 1000 New Bag at 10/05/20 1045   fluorouracil (ADRUCIL) 5,700 mg in sodium chloride 0.9 % 136 mL chemo infusion  2,400 mg/m2 (Order-Specific) Intravenous 1 day or 1 dose [name], MD   5,700 mg at 07/20/21 1210   heparin lock flush 100 unit/mL  500 Units Intravenous Once Lequita Asal, MD        Performance status (ECOG): 1  Vitals Blood pressure 114/75, pulse 71, temperature (!) 96.8 F (36 C), temperature source Tympanic, weight 255 lb (115.7 kg).   Physical Exam Vitals and nursing note reviewed.  Constitutional:      General: He is not in acute distress.    Appearance: He is well-developed. He is obese. He is not diaphoretic.     Interventions: Face mask in place.     Comments: Patient walks with a cane  HENT:     Head: Normocephalic and atraumatic.      Mouth/Throat:     Mouth: Mucous membranes are moist.     Pharynx: Oropharynx is clear.  Eyes:     General: No scleral icterus.    Extraocular Movements: Extraocular movements intact.     Conjunctiva/sclera: Conjunctivae normal.     Pupils: Pupils are equal, round, and reactive to light.  Cardiovascular:     Rate and Rhythm: Normal rate and regular rhythm.     Heart sounds: Normal heart sounds. No murmur heard. Pulmonary:     Effort: Pulmonary effort is normal. No respiratory distress.     Breath sounds: No wheezing or rales.     Comments: Decreased breath sound bilaterally Chest:     Chest wall: No tenderness.  Abdominal:     General: Bowel sounds are normal. There is no distension.     Palpations: Abdomen is soft. There is no mass.     Tenderness: There is no abdominal tenderness. There is no guarding or rebound.  Musculoskeletal:        General: No swelling or tenderness. Normal range of motion.     Cervical back: Normal range of motion and neck supple.     Right lower leg: Edema present.     Left lower leg: Edema present.  Lymphadenopathy:     Head:     Right side of head: No preauricular, posterior auricular or occipital adenopathy.     Left side of head: No preauricular, posterior auricular or occipital adenopathy.     Cervical: No cervical adenopathy.     Upper Body:     Right upper body: No supraclavicular or axillary adenopathy.     Left upper body: No supraclavicular or axillary adenopathy.     Lower Body: No right inguinal adenopathy. No left inguinal adenopathy.  Skin:    General: Skin is warm and dry.  Neurological:     General: No focal deficit present.     Mental Status: He is alert and oriented to person, place, and time. Mental status is at baseline.  Psychiatric:        Mood and Affect: Mood normal.   Infusion on 07/20/2021  Component Date Value Ref Range Status   Sodium 07/20/2021 133 (L)  135 - 145 mmol/L Final   Potassium 07/20/2021 4.0  3.5 - 5.1  mmol/L Final   Chloride 07/20/2021 102  98 - 111 mmol/L Final   CO2 07/20/2021 25  22 - 32 mmol/L Final   Glucose, Bld 07/20/2021 147 (H)  70 - 99 mg/dL Final   Glucose reference range applies only to samples taken after fasting for at least 8 hours.   BUN 07/20/2021 11  8 - 23 mg/dL Final   Creatinine, Ser 07/20/2021 0.69  0.61 - 1.24 mg/dL Final   Calcium 07/20/2021 9.5  8.9 - 10.3 mg/dL Final   Total Protein 07/20/2021 6.6  6.5 - 8.1 g/dL Final   Albumin 07/20/2021 3.5  3.5 - 5.0 g/dL Final   AST 07/20/2021 50 (H)  15 - 41 U/L Final   ALT 07/20/2021 38  0 - 44 U/L Final   Alkaline Phosphatase 07/20/2021 41  38 - 126 U/L Final   Total Bilirubin 07/20/2021 1.2  0.3 - 1.2 mg/dL Final   GFR, Estimated 07/20/2021 >60  >60 mL/min Final   Comment: (NOTE) Calculated using the CKD-EPI Creatinine Equation (2021)    Anion gap 07/20/2021 6  5 - 15 Final   Performed at [facility], [address] [place], [place] [address]   WBC 07/20/2021 4.3  4.0 - 10.5 K/uL Final   RBC 07/20/2021 3.89 (L)  4.22 - 5.81 MIL/uL Final   Hemoglobin 07/20/2021 12.4 (L)  13.0 - 17.0 g/dL Final   HCT 07/20/2021 37.3 (L)  39.0 - 52.0 % Final   MCV 07/20/2021 95.9  80.0 - 100.0 fL Final   MCH 07/20/2021 31.9  26.0 - 34.0 pg Final   MCHC 07/20/2021 33.2  30.0 - 36.0 g/dL Final   RDW 07/20/2021 13.8  11.5 - 15.5 % Final   Platelets 07/20/2021 158  150 - 400 K/uL Final   nRBC 07/20/2021 0.0  0.0 - 0.2 % Final   Neutrophils Relative % 07/20/2021 55  % Final   Neutro Abs 07/20/2021 2.4  1.7 - 7.7 K/uL Final   Lymphocytes Relative 07/20/2021 32  % Final   Lymphs Abs 07/20/2021 1.4  0.7 - 4.0 K/uL Final   Monocytes Relative 07/20/2021 9  % Final   Monocytes Absolute 07/20/2021 0.4  0.1 - 1.0 K/uL Final   Eosinophils Relative 07/20/2021 3  % Final   Eosinophils Absolute 07/20/2021 0.1  0.0 - 0.5 K/uL Final   Basophils Relative 07/20/2021 1  % Final   Basophils Absolute 07/20/2021 0.0  0.0 - 0.1 K/uL Final    Immature Granulocytes 07/20/2021 0  % Final   Abs Immature Granulocytes 07/20/2021 0.01  0.00 - 0.07 K/uL Final   Performed at [facility], [address] [place], [place] [address]   Total Protein, Urine 07/20/2021 11  mg/dL Final   Comment: NO NORMAL RANGE ESTABLISHED FOR THIS TEST Performed at [facility], [address] [place], [place] [address]     Assessment and plan 1. 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 Anemia due to chemotherapy    #Metastatic colon cancer-recurrent Overall he tolerates chemotherapy.   CMP is relatively stable. Labs are reviewed and discussed with patient. Proceed with 5-FU/bevacizumab today.   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  Patient has gabapentin supply at home.Patient declines gabapentin and acupuncture. Symptoms are manageable.   #Bloody stool, likely due to constipation/hemorrhoid.  Close monitor.  If recurrent, he will need to repeat a colonoscopy. Follow-up  2 weeks lab MD 5-FU/bevacizumab -   I discussed the assessment and treatment plan with the patient.  The patient was provided an opportunity to ask questions and all were answered.  The patient agreed with the plan and demonstrated an understanding of the instructions.  The patient was advised to call back if the symptoms worsen or if the condition fails to improve as anticipated.  [name], MD, PhD 07/20/2021

## 2021-07-21 LAB — CEA: CEA: 11.6 ng/mL — ABNORMAL HIGH (ref 0.0–4.7)

## 2021-07-22 ENCOUNTER — Inpatient Hospital Stay: Payer: Medicare Other

## 2021-07-22 ENCOUNTER — Other Ambulatory Visit: Payer: Self-pay

## 2021-07-22 ENCOUNTER — Encounter: Payer: Self-pay | Admitting: Oncology

## 2021-07-22 DIAGNOSIS — Z5112 Encounter for antineoplastic immunotherapy: Secondary | ICD-10-CM | POA: Diagnosis not present

## 2021-07-22 DIAGNOSIS — C786 Secondary malignant neoplasm of retroperitoneum and peritoneum: Secondary | ICD-10-CM

## 2021-07-22 DIAGNOSIS — Z85038 Personal history of other malignant neoplasm of large intestine: Secondary | ICD-10-CM

## 2021-07-22 MED ORDER — SODIUM CHLORIDE 0.9% FLUSH
10.0000 mL | INTRAVENOUS | Status: DC | PRN
  Administered 2021-07-22: 10 mL
  Filled 2021-07-22: qty 10

## 2021-07-22 MED ORDER — HEPARIN SOD (PORK) LOCK FLUSH 100 UNIT/ML IV SOLN
500.0000 [IU] | Freq: Once | INTRAVENOUS | Status: AC | PRN
  Administered 2021-07-22: 500 [IU]
  Filled 2021-07-22: qty 5

## 2021-07-22 NOTE — Addendum Note (Signed)
Addended by: [name] on: 07/22/2021 09:25 PM ? ? Modules accepted: Orders ? ?

## 2021-08-03 ENCOUNTER — Other Ambulatory Visit: Payer: Self-pay

## 2021-08-03 ENCOUNTER — Inpatient Hospital Stay: Payer: Medicare Other

## 2021-08-03 ENCOUNTER — Encounter: Payer: Self-pay | Admitting: Oncology

## 2021-08-03 ENCOUNTER — Inpatient Hospital Stay (HOSPITAL_BASED_OUTPATIENT_CLINIC_OR_DEPARTMENT_OTHER): Payer: Medicare Other | Admitting: Oncology

## 2021-08-03 VITALS — BP 156/83 | HR 73 | Temp 97.8°F | Wt 255.0 lb

## 2021-08-03 DIAGNOSIS — Z5112 Encounter for antineoplastic immunotherapy: Secondary | ICD-10-CM | POA: Diagnosis not present

## 2021-08-03 DIAGNOSIS — C184 Malignant neoplasm of transverse colon: Secondary | ICD-10-CM | POA: Diagnosis not present

## 2021-08-03 DIAGNOSIS — C786 Secondary malignant neoplasm of retroperitoneum and peritoneum: Secondary | ICD-10-CM | POA: Diagnosis not present

## 2021-08-03 DIAGNOSIS — D6481 Anemia due to antineoplastic chemotherapy: Secondary | ICD-10-CM

## 2021-08-03 DIAGNOSIS — Z5111 Encounter for antineoplastic chemotherapy: Secondary | ICD-10-CM

## 2021-08-03 DIAGNOSIS — G620 Drug-induced polyneuropathy: Secondary | ICD-10-CM | POA: Diagnosis not present

## 2021-08-03 DIAGNOSIS — C189 Malignant neoplasm of colon, unspecified: Secondary | ICD-10-CM

## 2021-08-03 DIAGNOSIS — T451X5A Adverse effect of antineoplastic and immunosuppressive drugs, initial encounter: Secondary | ICD-10-CM

## 2021-08-03 DIAGNOSIS — Z85038 Personal history of other malignant neoplasm of large intestine: Secondary | ICD-10-CM

## 2021-08-03 LAB — COMPREHENSIVE METABOLIC PANEL
ALT: 29 U/L (ref 0–44)
AST: 45 U/L — ABNORMAL HIGH (ref 15–41)
Albumin: 3.4 g/dL — ABNORMAL LOW (ref 3.5–5.0)
Alkaline Phosphatase: 41 U/L (ref 38–126)
Anion gap: 6 (ref 5–15)
BUN: 8 mg/dL (ref 8–23)
CO2: 24 mmol/L (ref 22–32)
Calcium: 9.1 mg/dL (ref 8.9–10.3)
Chloride: 105 mmol/L (ref 98–111)
Creatinine, Ser: 0.73 mg/dL (ref 0.61–1.24)
GFR, Estimated: >60 mL/min (ref >60)
Glucose, Bld: 163 mg/dL — ABNORMAL HIGH (ref 70–99)
Potassium: 3.5 mmol/L (ref 3.5–5.1)
Sodium: 135 mmol/L (ref 135–145)
Total Bilirubin: 1 mg/dL (ref 0.3–1.2)
Total Protein: 6.8 g/dL (ref 6.5–8.1)

## 2021-08-03 LAB — CBC WITH DIFFERENTIAL/PLATELET
Abs Immature Granulocytes: 0.01 10*3/uL (ref 0.00–0.07)
Basophils Absolute: 0 10*3/uL (ref 0.0–0.1)
Basophils Relative: 1 %
Eosinophils Absolute: 0.1 10*3/uL (ref 0.0–0.5)
Eosinophils Relative: 1 %
HCT: 36.2 % — ABNORMAL LOW (ref 39.0–52.0)
Hemoglobin: 12 g/dL — ABNORMAL LOW (ref 13.0–17.0)
Immature Granulocytes: 0 %
Lymphocytes Relative: 27 %
Lymphs Abs: 1.3 10*3/uL (ref 0.7–4.0)
MCH: 32 pg (ref 26.0–34.0)
MCHC: 33.1 g/dL (ref 30.0–36.0)
MCV: 96.5 fL (ref 80.0–100.0)
Monocytes Absolute: 0.4 10*3/uL (ref 0.1–1.0)
Monocytes Relative: 8 %
Neutro Abs: 3 10*3/uL (ref 1.7–7.7)
Neutrophils Relative %: 63 %
Platelets: 155 10*3/uL (ref 150–400)
RBC: 3.75 MIL/uL — ABNORMAL LOW (ref 4.22–5.81)
RDW: 14.4 % (ref 11.5–15.5)
WBC: 4.7 10*3/uL (ref 4.0–10.5)
nRBC: 0 % (ref 0.0–0.2)

## 2021-08-03 LAB — PROTEIN, URINE, RANDOM: Total Protein, Urine: 15 mg/dL

## 2021-08-03 MED ORDER — SODIUM CHLORIDE 0.9 % IV SOLN
2400.0000 mg/m2 | INTRAVENOUS | Status: DC
  Administered 2021-08-03: 5700 mg via INTRAVENOUS
  Filled 2021-08-03: qty 114

## 2021-08-03 MED ORDER — SODIUM CHLORIDE 0.9 % IV SOLN
5.0000 mg/kg | Freq: Once | INTRAVENOUS | Status: AC
  Administered 2021-08-03: 600 mg via INTRAVENOUS
  Filled 2021-08-03: qty 8

## 2021-08-03 MED ORDER — SODIUM CHLORIDE 0.9 % IV SOLN
10.0000 mg | Freq: Once | INTRAVENOUS | Status: AC
  Administered 2021-08-03: 10 mg via INTRAVENOUS
  Filled 2021-08-03: qty 10

## 2021-08-03 MED ORDER — SODIUM CHLORIDE 0.9 % IV SOLN
950.0000 mg | Freq: Once | INTRAVENOUS | Status: AC
  Administered 2021-08-03: 950 mg via INTRAVENOUS
  Filled 2021-08-03: qty 47.5

## 2021-08-03 MED ORDER — SODIUM CHLORIDE 0.9 % IV SOLN
Freq: Once | INTRAVENOUS | Status: AC
  Filled 2021-08-03: qty 250

## 2021-08-03 MED ORDER — FLUOROURACIL CHEMO INJECTION 2.5 GM/50ML
400.0000 mg/m2 | Freq: Once | INTRAVENOUS | Status: AC
  Administered 2021-08-03: 950 mg via INTRAVENOUS
  Filled 2021-08-03: qty 19

## 2021-08-03 NOTE — Progress Notes (Signed)
? ?Hematology/Oncology Progress note ?[phone] [phone] ?  ? ? ? ?Clinic Day: 08/03/2021  ? ?Referring physician: Tracie Harrier, MD ? ?Chief Complaint: Jared Gutierrez is a 81 y.o. male presents for follow-up of metastatic colon cancer  ? ?PERTINENT ONCOLOGY HISTORY ?Jared Gutierrez is a 81 y.o.[place] who has above oncology history reviewed by me today presented for follow up visit for management of  ?Metastatic colon cancer. ?[name], patient switched care to me on 11/11/20 ?Extensive medical record review was performed by me ? ?# Metastatic colon cancer.  he was diagnosed with stage I colon cancer s/p transverse colectomy on 10/17/2013.  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  Tumor extended into the submucosa. Margins were negative. + lymphovascular invasion. 14 lymph nodes were negative. Pathologic stage was T1 N0. ?  ?10/02/2014 Colonoscopy showed several 3 mm polyps and an 8 mm polyp in the cecum and transverse colon.  Pathology revealed tubular adenomas negative for high-grade dysplasia and malignancy.  ?02/16/2017 Colonoscopy  revealed 2 diminutive polyps in the descending colon.  Pathology revealed tubular adenomas without dysplasia or malignancy. ? ?Her CEA has been monitored and was noted to increase starting July 2020. ?04/02/2019  PET scan  limited evealed a 2.4 x 2.3 cm (SUV 11) hypermetabolic soft tissue density caudal and anterior to the pancreatic neck favored to represent isolated peritoneal or nodal metastasis in the setting of prior transverse colonic resection (expected primary drainage). Although this was immediately adjacent to the pancreas, a fat plane was maintained, arguing strongly against a pancreatic primary. Otherwise, there was no evidence of hypermetabolic metastasis. ?  ?04/17/2019 EUS on  revealed a normal  esophagus, stomach, duodenum, and pancreas.  There was a 2.4 x 2.4 cm irregular mass in the retroperitoneum adjacent to, but not involving the pancreatic neck.  FNA and core needle biopsy were performed.  Pathology revealed adenocarcinoma compatible with a metastatic lesion of colorectal origin.  Tumor cells were positive for CK20 and CDX2 and negative for CK7. ? ? ?NGS: Omniseq on 05/15/2019 revealed + KRASG13D and TP53.  Negative results included BRAF V600E, Her2, NRAS, NTRK, PD-L1 (<1%), and TMB 8.7/Mb (intermediate).  MMR testing from his colon resection on 10/16/2013 was intact with a low probability of MSI-H. ? ?06/25/2019, CEA 17.8. ?07/09/2019 - 08/27/2019; 09/17/2019 - 10/15/2019; 11/12/2019 - 12/17/2019 He received 11 cycles of FOLFOX chemotherapy and 1 cycle of 5-FU and leucovorin (12/31/2019).  He received Neulasta after cycle #4 and #5 secondary to progressive leukopenia.  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  He received a truncated course of FOLFOX with cycle #11 secondary to oxaliplatin reaction. ? ?08/27/2019, CEA 6.1 ?10/29/2019, CEA 7.7 ?12/31/2019, CEA 10.8. ? ?01/14/2020 PET revealed an interval decrease in size and FDG uptake (2.4 x 2.3 cm with SUV 11 to 2.4 x 1.7 cm with SUV 4.09) associated with the previously referenced soft tissue density caudal and anterior to the pancreatic neck suggesting treatment response. There were no new sites of FDG avid tumor. ? ?Radiation treatment ?02/10/2020 - 02/23/2020.   ?He received a hybrid 3-dimensional course of 3000 cGy in 10 fractions to the residual FDG avid mass  ? ?05/27/2020, CEA 11.6 ?05/27/2020 Abdomen and pelvis CT :stable to minimally increased (2.9 x 1.8 cm compared to 2.4 x 1.7 cm) since 01/14/2020.  There were no new interval findings.   ? ?08/31/2020, CEA 30.9. ?08/31/2020 Abdomen  and pelvis CT revealed increased size of the index soft tissue lesion inferior and anterior to the pancreatic neck (2.9 x 1.8 cm to 3.5 x 2.9 cm). There  were similar prominent retroperitoneal lymph nodes without adenopathy by size criteria. There was no new or enlarging abdominal or pelvic lymph nodes. There were no new interval findings. There was similar circumferential wall thickening of a nondistended urinary bladder, which likely accentuated wall thickening There was hepatic steatosis and aortic atherosclerosis. ? 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  ?The lesion is 3.8 x 2.9 cm with SUV of 11.3. ?No evidence of metastatic peritoneal disease elsewhere in the abdomen pelvis.  No evidence of distant metastasis. ? ?Other medical problems ?Chronic lower extremity edema. ?12/24/2018, right lower extremity duplex negative for DVT.  Small right Baker's cyst. ?08/16/2019, bilateral lower extremity duplex showed no DVT. ? ?08/16/2019 - 08/17/2019 with right lower extremity cellulitis.  He was unable to bear weight.  He was treated with IV fluids, NSAIDs, colchicine, and broad antibiotics (vancomycin and Cefepime).  He was discharged on indomethacin x 5 days and Keflex 500 mg TID x 5 days. ? ?10/05/2020, colonoscopy showed internal hemorrhoids.  Otherwise normal examination. 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 ?Given that he has no other distant metastasis.  Repeat SBRT may be considered if he does not respond well to systemic chemotherapy.  Discussed with radiation oncology. ? ?11/26/2020 patient was started on 5-FU and bevacizumab. ?12/14/2020 CEA 29.5 ?02/17/2021 CT abdomen pelvis showed partial response. Mild decrease of peritoneal lesion.  ?Proceed with 5-FU and bevacizumab today.  ?Given that he is tolerating current regimen with good life quality, partial response.  Shared decision was made to hold off adding additional agents. ? ?06/08/2021, CT chest abdomen pelvis with contrast showed soft tissue mass at the base of mesentery inferior to the pancreatic neck is unchanged  in size.  No progressive disease was identified. ?Labs reviewed and discussed with patient ? ?INTERVAL HISTORY ?Jared Gutierrez is a 80 y.o. male who has above history reviewed by me today presents for follow up visit for management of recurrent metastatic colon cancer ?He was accompanied by wife ?Patient reports feeling well.  No new complaints.  No additional episodes of blood in the stool.  No abdominal pain. ? ? ?Review of Systems  ?Constitutional:  Negative for appetite change, chills, diaphoresis, fever and unexpected weight change.  ?HENT:   Negative for hearing loss, nosebleeds, sore throat and tinnitus.   ?Respiratory:  Negative for cough, hemoptysis and shortness of breath.   ?Cardiovascular:  Positive for leg swelling. Negative for chest pain and palpitations.  ?Gastrointestinal:  Negative for abdominal pain, blood in stool, constipation, diarrhea, nausea and vomiting.  ?Genitourinary:  Negative for dysuria, frequency and hematuria.   ?Musculoskeletal:  Positive for arthralgias. Negative for back pain, myalgias and neck pain.  ?Skin:  Negative for itching and rash.  ?Neurological:  Negative for dizziness and headaches.  ?Hematological:  Does not bruise/bleed easily.  ?Psychiatric/Behavioral:  Negative for depression. The patient is not nervous/anxious.   ? ? ? ?Past Medical History:  ?Diagnosis Date  ? Arthritis   ? OSTEOARTHRITIS  ? Cancer (HCC)   ? Cavitary lesion of lung   ? RIGHT LOWER LOBE  ? Chicken pox   ? Colon cancer (HCC)   ? History of kidney stones   ? Hypertension   ? Lipoma of colon   ? Nephrolithiasis   ?   Nephrolithiasis   ? Obesity   ? Shingles   ? Tubular adenoma of colon   ? multiple fragments  ? ? ?Past Surgical History:  ?Procedure Laterality Date  ? COLON SURGERY    ? COLONOSCOPY N/A 10/02/2014  ? Procedure: COLONOSCOPY;  Surgeon: Josefine Class, MD;  Location[facility] ENDOSCOPY;  Service: Endoscopy;  Laterality: N/A;  ? COLONOSCOPY WITH PROPOFOL N/A 02/16/2017  ? Procedure:  COLONOSCOPY WITH PROPOFOL;  Surgeon: Manya Silvas, MD;  Location: [facility] ENDOSCOPY;  Service: Endoscopy;  Laterality: N/A;  ? COLONOSCOPY WITH PROPOFOL N/A 10/05/2020  ? Procedure: COLONOSCOPY WITH PROPOFOL;  [place]

## 2021-08-03 NOTE — Patient Instructions (Signed)
[facility] CANCER CTR AT [place]  Discharge Instructions: ?Thank you for choosing [facility] to provide your oncology and hematology care.  ?If you have a lab appointment with the [facility], please go directly to the [place] and check in at the registration area. ? ?Wear comfortable clothing and clothing appropriate for easy access to any Portacath or PICC line.  ? ?We strive to give you quality time with your provider. You may need to reschedule your appointment if you arrive late (15 or more minutes).  Arriving late affects you and other patients whose appointments are after yours.  Also, if you miss three or more appointments without notifying the office, you may be dismissed from the clinic at the provider?s discretion.    ?  ?For prescription refill requests, have your pharmacy contact our office and allow 72 hours for refills to be completed.   ? ?Today you received the following chemotherapy and/or immunotherapy agents Mvasi, Leucovorin and Adrucil     ?  ?To help prevent nausea and vomiting after your treatment, we encourage you to take your nausea medication as directed. ? ?BELOW ARE SYMPTOMS THAT SHOULD BE REPORTED IMMEDIATELY: ?*FEVER GREATER THAN 100.4 F (38 ?C) OR HIGHER ?*CHILLS OR SWEATING ?*NAUSEA AND VOMITING THAT IS NOT CONTROLLED WITH YOUR NAUSEA MEDICATION ?*UNUSUAL SHORTNESS OF BREATH ?*UNUSUAL BRUISING OR BLEEDING ?*URINARY PROBLEMS (pain or burning when urinating, or frequent urination) ?*BOWEL PROBLEMS (unusual diarrhea, constipation, pain near the anus) ?TENDERNESS IN MOUTH AND THROAT WITH OR WITHOUT PRESENCE OF ULCERS (sore throat, sores in mouth, or a toothache) ?UNUSUAL RASH, SWELLING OR PAIN  ?UNUSUAL VAGINAL DISCHARGE OR ITCHING  ? ?Items with * indicate a potential emergency and should be followed up as soon as possible or go to the Emergency Department if any problems should occur. ? ?Please show the CHEMOTHERAPY ALERT CARD or IMMUNOTHERAPY ALERT  CARD at check-in to the Emergency Department and triage nurse. ? ?Should you have questions after your visit or need to cancel or reschedule your appointment, please contact [facility] CANCER [place] AT [place]  [phone] and follow the prompts.  Office hours are 8:00 a.m. to 4:30 p.m. Monday - Friday. Please note that voicemails left after 4:00 p.m. may not be returned until the following business day.  We are closed weekends and major holidays. You have access to a nurse at all times for urgent questions. Please call the main number to the clinic [phone] and follow the prompts. ? ?For any non-urgent questions, you may also contact your provider using MyChart. We now offer e-Visits for anyone 39 and older to request care online for non-urgent symptoms. For details visit [email or web address]. ?  ?Also download the MyChart app! Go to the app store, search "MyChart", open the app, select [facility], and log in with your MyChart username and password. ? ?Due to Covid, a mask is required upon entering the hospital/clinic. If you do not have a mask, one will be given to you upon arrival. For doctor visits, patients may have 1 support person aged 79 or older with them. For treatment visits, patients cannot have anyone with them due to current Covid guidelines and our immunocompromised population.  ?

## 2021-08-03 NOTE — Progress Notes (Signed)
Patient here for follow up. Patient states he has nose bleeds first thing in the morning x 3 weeks. ?

## 2021-08-04 LAB — CEA: CEA: 11.4 ng/mL — ABNORMAL HIGH (ref 0.0–4.7)

## 2021-08-05 ENCOUNTER — Other Ambulatory Visit: Payer: Self-pay

## 2021-08-05 ENCOUNTER — Inpatient Hospital Stay: Payer: Medicare Other

## 2021-08-05 VITALS — BP 154/75 | HR 76

## 2021-08-05 DIAGNOSIS — C786 Secondary malignant neoplasm of retroperitoneum and peritoneum: Secondary | ICD-10-CM

## 2021-08-05 DIAGNOSIS — Z5112 Encounter for antineoplastic immunotherapy: Secondary | ICD-10-CM | POA: Diagnosis not present

## 2021-08-05 DIAGNOSIS — Z85038 Personal history of other malignant neoplasm of large intestine: Secondary | ICD-10-CM

## 2021-08-05 MED ORDER — HEPARIN SOD (PORK) LOCK FLUSH 100 UNIT/ML IV SOLN
500.0000 [IU] | Freq: Once | INTRAVENOUS | Status: AC | PRN
  Administered 2021-08-05: 500 [IU]
  Filled 2021-08-05: qty 5

## 2021-08-05 MED ORDER — SODIUM CHLORIDE 0.9% FLUSH
10.0000 mL | INTRAVENOUS | Status: DC | PRN
  Administered 2021-08-05: 10 mL
  Filled 2021-08-05: qty 10

## 2021-08-09 ENCOUNTER — Other Ambulatory Visit: Payer: Self-pay | Admitting: *Deleted

## 2021-08-09 DIAGNOSIS — C786 Secondary malignant neoplasm of retroperitoneum and peritoneum: Secondary | ICD-10-CM

## 2021-08-11 ENCOUNTER — Other Ambulatory Visit: Payer: Self-pay | Admitting: Oncology

## 2021-08-11 NOTE — Telephone Encounter (Signed)
Component Ref Range & Units 8 d ago ?(08/03/21) 3 wk ago ?(07/20/21) 1 mo ago ?(07/06/21) 2 mo ago ?(06/10/21) 2 mo ago ?(05/27/21) 2 mo ago ?(05/18/21) 3 mo ago ?(05/03/21)  ?Potassium 3.5 - 5.1 mmol/L 3.5  4.0  3.8  4.0  3.8  3.5  3.1 Low    ? ?

## 2021-08-12 ENCOUNTER — Encounter: Payer: Self-pay | Admitting: Oncology

## 2021-08-17 ENCOUNTER — Inpatient Hospital Stay: Payer: Medicare Other

## 2021-08-17 ENCOUNTER — Inpatient Hospital Stay (HOSPITAL_BASED_OUTPATIENT_CLINIC_OR_DEPARTMENT_OTHER): Payer: Medicare Other | Admitting: Oncology

## 2021-08-17 ENCOUNTER — Inpatient Hospital Stay: Payer: Medicare Other | Attending: Oncology

## 2021-08-17 ENCOUNTER — Other Ambulatory Visit: Payer: Self-pay

## 2021-08-17 ENCOUNTER — Encounter: Payer: Self-pay | Admitting: Oncology

## 2021-08-17 VITALS — BP 165/85 | HR 62 | Temp 96.9°F | Resp 18 | Wt 258.0 lb

## 2021-08-17 DIAGNOSIS — E669 Obesity, unspecified: Secondary | ICD-10-CM | POA: Insufficient documentation

## 2021-08-17 DIAGNOSIS — D6481 Anemia due to antineoplastic chemotherapy: Secondary | ICD-10-CM | POA: Diagnosis not present

## 2021-08-17 DIAGNOSIS — Z85038 Personal history of other malignant neoplasm of large intestine: Secondary | ICD-10-CM

## 2021-08-17 DIAGNOSIS — Z5112 Encounter for antineoplastic immunotherapy: Secondary | ICD-10-CM | POA: Diagnosis present

## 2021-08-17 DIAGNOSIS — C189 Malignant neoplasm of colon, unspecified: Secondary | ICD-10-CM

## 2021-08-17 DIAGNOSIS — C786 Secondary malignant neoplasm of retroperitoneum and peritoneum: Secondary | ICD-10-CM

## 2021-08-17 DIAGNOSIS — T451X5A Adverse effect of antineoplastic and immunosuppressive drugs, initial encounter: Secondary | ICD-10-CM

## 2021-08-17 DIAGNOSIS — Z86010 Personal history of colonic polyps: Secondary | ICD-10-CM | POA: Diagnosis not present

## 2021-08-17 DIAGNOSIS — Z79899 Other long term (current) drug therapy: Secondary | ICD-10-CM | POA: Insufficient documentation

## 2021-08-17 DIAGNOSIS — Z803 Family history of malignant neoplasm of breast: Secondary | ICD-10-CM | POA: Diagnosis not present

## 2021-08-17 DIAGNOSIS — Z5111 Encounter for antineoplastic chemotherapy: Secondary | ICD-10-CM

## 2021-08-17 DIAGNOSIS — Z836 Family history of other diseases of the respiratory system: Secondary | ICD-10-CM | POA: Diagnosis not present

## 2021-08-17 DIAGNOSIS — C184 Malignant neoplasm of transverse colon: Secondary | ICD-10-CM | POA: Diagnosis present

## 2021-08-17 DIAGNOSIS — T451X5D Adverse effect of antineoplastic and immunosuppressive drugs, subsequent encounter: Secondary | ICD-10-CM | POA: Insufficient documentation

## 2021-08-17 DIAGNOSIS — G620 Drug-induced polyneuropathy: Secondary | ICD-10-CM

## 2021-08-17 DIAGNOSIS — Z809 Family history of malignant neoplasm, unspecified: Secondary | ICD-10-CM | POA: Diagnosis not present

## 2021-08-17 LAB — CBC WITH DIFFERENTIAL/PLATELET
Abs Immature Granulocytes: 0.01 10*3/uL (ref 0.00–0.07)
Basophils Absolute: 0 10*3/uL (ref 0.0–0.1)
Basophils Relative: 1 %
Eosinophils Absolute: 0.1 10*3/uL (ref 0.0–0.5)
Eosinophils Relative: 2 %
HCT: 37.7 % — ABNORMAL LOW (ref 39.0–52.0)
Hemoglobin: 12.4 g/dL — ABNORMAL LOW (ref 13.0–17.0)
Immature Granulocytes: 0 %
Lymphocytes Relative: 31 %
Lymphs Abs: 1.5 10*3/uL (ref 0.7–4.0)
MCH: 31.8 pg (ref 26.0–34.0)
MCHC: 32.9 g/dL (ref 30.0–36.0)
MCV: 96.7 fL (ref 80.0–100.0)
Monocytes Absolute: 0.5 10*3/uL (ref 0.1–1.0)
Monocytes Relative: 11 %
Neutro Abs: 2.6 10*3/uL (ref 1.7–7.7)
Neutrophils Relative %: 55 %
Platelets: 158 10*3/uL (ref 150–400)
RBC: 3.9 MIL/uL — ABNORMAL LOW (ref 4.22–5.81)
RDW: 14.3 % (ref 11.5–15.5)
WBC: 4.8 10*3/uL (ref 4.0–10.5)
nRBC: 0 % (ref 0.0–0.2)

## 2021-08-17 LAB — COMPREHENSIVE METABOLIC PANEL
ALT: 28 U/L (ref 0–44)
AST: 44 U/L — ABNORMAL HIGH (ref 15–41)
Albumin: 3.5 g/dL (ref 3.5–5.0)
Alkaline Phosphatase: 40 U/L (ref 38–126)
Anion gap: 6 (ref 5–15)
BUN: 12 mg/dL (ref 8–23)
CO2: 24 mmol/L (ref 22–32)
Calcium: 9.2 mg/dL (ref 8.9–10.3)
Chloride: 103 mmol/L (ref 98–111)
Creatinine, Ser: 0.76 mg/dL (ref 0.61–1.24)
GFR, Estimated: >60 mL/min (ref >60)
Glucose, Bld: 112 mg/dL — ABNORMAL HIGH (ref 70–99)
Potassium: 3.8 mmol/L (ref 3.5–5.1)
Sodium: 133 mmol/L — ABNORMAL LOW (ref 135–145)
Total Bilirubin: 0.8 mg/dL (ref 0.3–1.2)
Total Protein: 6.8 g/dL (ref 6.5–8.1)

## 2021-08-17 LAB — PROTEIN, URINE, RANDOM: Total Protein, Urine: 15 mg/dL

## 2021-08-17 MED ORDER — HEPARIN SOD (PORK) LOCK FLUSH 100 UNIT/ML IV SOLN
500.0000 [IU] | Freq: Once | INTRAVENOUS | Status: DC | PRN
  Filled 2021-08-17: qty 5

## 2021-08-17 MED ORDER — SODIUM CHLORIDE 0.9 % IV SOLN
10.0000 mg | Freq: Once | INTRAVENOUS | Status: AC
  Administered 2021-08-17: 10 mg via INTRAVENOUS
  Filled 2021-08-17: qty 10

## 2021-08-17 MED ORDER — SODIUM CHLORIDE 0.9 % IV SOLN
950.0000 mg | Freq: Once | INTRAVENOUS | Status: AC
  Administered 2021-08-17: 950 mg via INTRAVENOUS
  Filled 2021-08-17: qty 47.5

## 2021-08-17 MED ORDER — SODIUM CHLORIDE 0.9% FLUSH
10.0000 mL | INTRAVENOUS | Status: DC | PRN
  Administered 2021-08-17: 10 mL via INTRAVENOUS
  Filled 2021-08-17: qty 10

## 2021-08-17 MED ORDER — SODIUM CHLORIDE 0.9 % IV SOLN
2400.0000 mg/m2 | INTRAVENOUS | Status: DC
  Administered 2021-08-17: 5700 mg via INTRAVENOUS
  Filled 2021-08-17 (×2): qty 114

## 2021-08-17 MED ORDER — FLUOROURACIL CHEMO INJECTION 2.5 GM/50ML
950.0000 mg | Freq: Once | INTRAVENOUS | Status: AC
  Administered 2021-08-17: 950 mg via INTRAVENOUS
  Filled 2021-08-17 (×2): qty 19

## 2021-08-17 MED ORDER — SODIUM CHLORIDE 0.9 % IV SOLN
5.0000 mg/kg | Freq: Once | INTRAVENOUS | Status: AC
  Administered 2021-08-17: 600 mg via INTRAVENOUS
  Filled 2021-08-17: qty 16
  Filled 2021-08-17: qty 24

## 2021-08-17 MED ORDER — SODIUM CHLORIDE 0.9 % IV SOLN
Freq: Once | INTRAVENOUS | Status: AC
  Filled 2021-08-17: qty 250

## 2021-08-17 MED ORDER — HEPARIN SOD (PORK) LOCK FLUSH 100 UNIT/ML IV SOLN
500.0000 [IU] | Freq: Once | INTRAVENOUS | Status: DC
  Filled 2021-08-17: qty 5

## 2021-08-17 NOTE — Progress Notes (Signed)
Patient here for oncology follow-up appointment, concerns of hand numbness and nose bleeds  ?

## 2021-08-17 NOTE — Progress Notes (Signed)
? ?Hematology/Oncology Progress note ?Telephone:(336) [phone] [phone] ?  ? ? ? ?Clinic Day: 08/17/2021  ? ?Referring physician: Tracie Harrier, MD ? ?Chief Complaint: Jared Gutierrez is a 81 y.o. male presents for follow-up of metastatic colon cancer  ? ?PERTINENT ONCOLOGY HISTORY ?Jared Gutierrez is a 81 y.o.[place] who has above oncology history reviewed by me today presented for follow up visit for management of  ?Metastatic colon cancer. ?[name], patient switched care to me on 11/11/20 ?Extensive medical record review was performed by me ? ?# Metastatic colon cancer.  he was diagnosed with stage I colon cancer s/p transverse colectomy on 10/17/2013.  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  Tumor extended into the submucosa. Margins were negative. + lymphovascular invasion. 14 lymph nodes were negative. Pathologic stage was T1 N0. ?  ?10/02/2014 Colonoscopy showed several 3 mm polyps and an 8 mm polyp in the cecum and transverse colon.  Pathology revealed tubular adenomas negative for high-grade dysplasia and malignancy.  ?02/16/2017 Colonoscopy  revealed 2 diminutive polyps in the descending colon.  Pathology revealed tubular adenomas without dysplasia or malignancy. ? ?Her CEA has been monitored and was noted to increase starting July 2020. ?04/02/2019  PET scan  limited evealed a 2.4 x 2.3 cm (SUV 11) hypermetabolic soft tissue density caudal and anterior to the pancreatic neck favored to represent isolated peritoneal or nodal metastasis in the setting of prior transverse colonic resection (expected primary drainage). Although this was immediately adjacent to the pancreas, a fat plane was maintained, arguing strongly against a pancreatic primary. Otherwise, there was no evidence of hypermetabolic metastasis. ?  ?04/17/2019 EUS on  revealed a normal  esophagus, stomach, duodenum, and pancreas.  There was a 2.4 x 2.4 cm irregular mass in the retroperitoneum adjacent to, but not involving the pancreatic neck.  FNA and core needle biopsy were performed.  Pathology revealed adenocarcinoma compatible with a metastatic lesion of colorectal origin.  Tumor cells were positive for CK20 and CDX2 and negative for CK7. ? ? ?NGS: Omniseq on 05/15/2019 revealed + KRASG13D and TP53.  Negative results included BRAF V600E, Her2, NRAS, NTRK, PD-L1 (<1%), and TMB 8.7/Mb (intermediate).  MMR testing from his colon resection on 10/16/2013 was intact with a low probability of MSI-H. ? ?06/25/2019, CEA 17.8. ?07/09/2019 - 08/27/2019; 09/17/2019 - 10/15/2019; 11/12/2019 - 12/17/2019 He received 11 cycles of FOLFOX chemotherapy and 1 cycle of 5-FU and leucovorin (12/31/2019).  He received Neulasta after cycle #4 and #5 secondary to progressive leukopenia.  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  He received a truncated course of FOLFOX with cycle #11 secondary to oxaliplatin reaction. ? ?08/27/2019, CEA 6.1 ?10/29/2019, CEA 7.7 ?12/31/2019, CEA 10.8. ? ?01/14/2020 PET revealed an interval decrease in size and FDG uptake (2.4 x 2.3 cm with SUV 11 to 2.4 x 1.7 cm with SUV 4.09) associated with the previously referenced soft tissue density caudal and anterior to the pancreatic neck suggesting treatment response. There were no new sites of FDG avid tumor. ? ?Radiation treatment ?02/10/2020 - 02/23/2020.   ?He received a hybrid 3-dimensional course of 3000 cGy in 10 fractions to the residual FDG avid mass  ? ?05/27/2020, CEA 11.6 ?05/27/2020 Abdomen and pelvis CT :stable to minimally increased (2.9 x 1.8 cm compared to 2.4 x 1.7 cm) since 01/14/2020.  There were no new interval findings.   ? ?08/31/2020, CEA 30.9. ?08/31/2020 Abdomen  and pelvis CT revealed increased size of the index soft tissue lesion inferior and anterior to the pancreatic neck (2.9 x 1.8 cm to 3.5 x 2.9 cm). There  were similar prominent retroperitoneal lymph nodes without adenopathy by size criteria. There was no new or enlarging abdominal or pelvic lymph nodes. There were no new interval findings. There was similar circumferential wall thickening of a nondistended urinary bladder, which likely accentuated wall thickening There was hepatic steatosis and aortic atherosclerosis. ? 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  ?The lesion is 3.8 x 2.9 cm with SUV of 11.3. ?No evidence of metastatic peritoneal disease elsewhere in the abdomen pelvis.  No evidence of distant metastasis. ? ?Other medical problems ?Chronic lower extremity edema. ?12/24/2018, right lower extremity duplex negative for DVT.  Small right Baker's cyst. ?08/16/2019, bilateral lower extremity duplex showed no DVT. ? ?08/16/2019 - 08/17/2019 with right lower extremity cellulitis.  He was unable to bear weight.  He was treated with IV fluids, NSAIDs, colchicine, and broad antibiotics (vancomycin and Cefepime).  He was discharged on indomethacin x 5 days and Keflex 500 mg TID x 5 days. ? ?10/05/2020, colonoscopy showed internal hemorrhoids.  Otherwise normal examination. 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 ?Given that he has no other distant metastasis.  Repeat SBRT may be considered if he does not respond well to systemic chemotherapy.  Discussed with radiation oncology. ? ?11/26/2020 patient was started on 5-FU and bevacizumab. ?12/14/2020 CEA 29.5 ?02/17/2021 CT abdomen pelvis showed partial response. Mild decrease of peritoneal lesion.  ?Proceed with 5-FU and bevacizumab today.  ?Given that he is tolerating current regimen with good life quality, partial response.  Shared decision was made to hold off adding additional agents. ? ?06/08/2021, CT chest abdomen pelvis with contrast showed soft tissue mass at the base of mesentery inferior to the pancreatic neck is unchanged  in size.  No progressive disease was identified. ?Labs reviewed and discussed with patient ? ?INTERVAL HISTORY ?Jared Gutierrez is a 81 y.o. male who has above history reviewed by me today presents for follow up visit for management of recurrent metastatic colon cancer ?He was accompanied by wife ?Patient reports feeling well.  + mild nose bleeds. + numbness of hands.  ? ? ?Review of Systems  ?Constitutional:  Negative for appetite change, chills, diaphoresis, fever and unexpected weight change.  ?HENT:   Negative for hearing loss, nosebleeds, sore throat and tinnitus.   ?Respiratory:  Negative for cough, hemoptysis and shortness of breath.   ?Cardiovascular:  Positive for leg swelling. Negative for chest pain and palpitations.  ?Gastrointestinal:  Negative for abdominal pain, blood in stool, constipation, diarrhea, nausea and vomiting.  ?Genitourinary:  Negative for dysuria, frequency and hematuria.   ?Musculoskeletal:  Positive for arthralgias. Negative for back pain, myalgias and neck pain.  ?Skin:  Negative for itching and rash.  ?Neurological:  Positive for numbness. Negative for dizziness and headaches.  ?Hematological:  Does not bruise/bleed easily.  ?Psychiatric/Behavioral:  Negative for depression. The patient is not nervous/anxious.   ? ? ? ?Past Medical History:  ?Diagnosis Date  ? Arthritis   ? OSTEOARTHRITIS  ? Cancer [facility])   ? Cavitary lesion of lung   ? RIGHT LOWER LOBE  ? Chicken pox   ? Colon cancer ([place])   ? History of kidney stones   ? Hypertension   ? Lipoma of colon   ? Nephrolithiasis   ? Nephrolithiasis   ?  Obesity   ? Shingles   ? Tubular adenoma of colon   ? multiple fragments  ? ? ?Past Surgical History:  ?Procedure Laterality Date  ? COLON SURGERY    ? COLONOSCOPY N/A 10/02/2014  ? Procedure: COLONOSCOPY;  Surgeon: Josefine Class, MD;  Location: [facility] ENDOSCOPY;  Service: Endoscopy;  Laterality: N/A;  ? COLONOSCOPY WITH PROPOFOL N/A 02/16/2017  ? Procedure: COLONOSCOPY WITH PROPOFOL;   Surgeon: Manya Silvas, MD;  Location: [facility] ENDOSCOPY;  Service: Endoscopy;  Laterality: N/A;  ? COLONOSCOPY WITH PROPOFOL N/A 10/05/2020  ? Procedure: COLONOSCOPY WITH PROPOFOL;  Surgeon: Andrey Farmer

## 2021-08-17 NOTE — Patient Instructions (Signed)
[facility] CANCER CTR AT [place]  Discharge Instructions: ?Thank you for choosing [facility] to provide your oncology and hematology care.  ?If you have a lab appointment with the [facility], please go directly to the [facility] and check in at the registration area. ? ?Wear comfortable clothing and clothing appropriate for easy access to any Portacath or PICC line.  ? ?We strive to give you quality time with your provider. You may need to reschedule your appointment if you arrive late (15 or more minutes).  Arriving late affects you and other patients whose appointments are after yours.  Also, if you miss three or more appointments without notifying the office, you may be dismissed from the clinic at the provider?s discretion.    ?  ?For prescription refill requests, have your pharmacy contact our office and allow 72 hours for refills to be completed.   ? ?Today you received the following chemotherapy and/or immunotherapy agents MVASI, LEUCOVORIN, 5 FU    ?  ?To help prevent nausea and vomiting after your treatment, we encourage you to take your nausea medication as directed. ? ?BELOW ARE SYMPTOMS THAT SHOULD BE REPORTED IMMEDIATELY: ?*FEVER GREATER THAN 100.4 F (38 ?C) OR HIGHER ?*CHILLS OR SWEATING ?*NAUSEA AND VOMITING THAT IS NOT CONTROLLED WITH YOUR NAUSEA MEDICATION ?*UNUSUAL SHORTNESS OF BREATH ?*UNUSUAL BRUISING OR BLEEDING ?*URINARY PROBLEMS (pain or burning when urinating, or frequent urination) ?*BOWEL PROBLEMS (unusual diarrhea, constipation, pain near the anus) ?TENDERNESS IN MOUTH AND THROAT WITH OR WITHOUT PRESENCE OF ULCERS (sore throat, sores in mouth, or a toothache) ?UNUSUAL RASH, SWELLING OR PAIN  ?UNUSUAL VAGINAL DISCHARGE OR ITCHING  ? ?Items with * indicate a potential emergency and should be followed up as soon as possible or go to the Emergency Department if any problems should occur. ? ?Please show the CHEMOTHERAPY ALERT CARD or IMMUNOTHERAPY ALERT CARD at  check-in to the Emergency Department and triage nurse. ? ?Should you have questions after your visit or need to cancel or reschedule your appointment, please contact [facility] CANCER [place] AT [place]  [phone] and follow the prompts.  Office hours are 8:00 a.m. to 4:30 p.m. Monday - Friday. Please note that voicemails left after 4:00 p.m. may not be returned until the following business day.  We are closed weekends and major holidays. You have access to a nurse at all times for urgent questions. Please call the main number to the clinic [phone] and follow the prompts. ? ?For any non-urgent questions, you may also contact your provider using MyChart. We now offer e-Visits for anyone 91 and older to request care online for non-urgent symptoms. For details visit [email or web address]. ?  ?Also download the MyChart app! Go to the app store, search "MyChart", open the app, select [facility], and log in with your MyChart username and password. ? ?Due to Covid, a mask is required upon entering the hospital/clinic. If you do not have a mask, one will be given to you upon arrival. For doctor visits, patients may have 1 support person aged 71 or older with them. For treatment visits, patients cannot have anyone with them due to current Covid guidelines and our immunocompromised population.  ? ?Bevacizumab injection ?What is this medication? ?BEVACIZUMAB (be va SIZ yoo mab) is a monoclonal antibody. It is used to treat many types of cancer. ?This medicine may be used for other purposes; ask your health care provider or pharmacist if you have questions. ?COMMON BRAND NAME(S): Alymsys, Avastin, MVASI, Zirabev ?What  should I tell my care team before I take this medication? ?They need to know if you have any of these conditions: ?diabetes ?heart disease ?high blood pressure ?history of coughing up blood ?prior anthracycline chemotherapy (e.g., doxorubicin, daunorubicin, epirubicin) ?recent or ongoing  radiation therapy ?recent or planning to have surgery ?stroke ?an unusual or allergic reaction to bevacizumab, hamster proteins, mouse proteins, other medicines, foods, dyes, or preservatives ?pregnant or trying to get pregnant ?breast-feeding ?How should I use this medication? ?This medicine is for infusion into a vein. It is given by a health care professional in a hospital or clinic setting. ?Talk to your pediatrician regarding the use of this medicine in children. Special care may be needed. ?Overdosage: If you think you have taken too much of this medicine contact a poison control center or emergency room at once. ?NOTE: This medicine is only for you. Do not share this medicine with others. ?What if I miss a dose? ?It is important not to miss your dose. Call your doctor or health care professional if you are unable to keep an appointment. ?What may interact with this medication? ?Interactions are not expected. ?This list may not describe all possible interactions. Give your health care provider a list of all the medicines, herbs, non-prescription drugs, or dietary supplements you use. Also tell them if you smoke, drink alcohol, or use illegal drugs. Some items may interact with your medicine. ?What should I watch for while using this medication? ?Your condition will be monitored carefully while you are receiving this medicine. You will need important blood work and urine testing done while you are taking this medicine. ?This medicine may increase your risk to bruise or bleed. Call your doctor or health care professional if you notice any unusual bleeding. ?Before having surgery, talk to your health care provider to make sure it is ok. This drug can increase the risk of poor healing of your surgical site or wound. You will need to stop this drug for 28 days before surgery. After surgery, wait at least 28 days before restarting this drug. Make sure the surgical site or wound is healed enough before restarting  this drug. Talk to your health care provider if questions. ?Do not become pregnant while taking this medicine or for 6 months after stopping it. Women should inform their doctor if they wish to become pregnant or think they might be pregnant. There is a potential for serious side effects to an unborn child. Talk to your health care professional or pharmacist for more information. Do not breast-feed an infant while taking this medicine and for 6 months after the last dose. ?This medicine has caused ovarian failure in some women. This medicine may interfere with the ability to have a child. You should talk to your doctor or health care professional if you are concerned about your fertility. ?What side effects may I notice from receiving this medication? ?Side effects that you should report to your doctor or health care professional as soon as possible: ?allergic reactions like skin rash, itching or hives, swelling of the face, lips, or tongue ?chest pain or chest tightness ?chills ?coughing up blood ?high fever ?seizures ?severe constipation ?signs and symptoms of bleeding such as bloody or black, tarry stools; red or dark-brown urine; spitting up blood or brown material that looks like coffee grounds; red spots on the skin; unusual bruising or bleeding from the eye, gums, or nose ?signs and symptoms of a blood clot such as breathing problems; chest pain; severe,  sudden headache; pain, swelling, warmth in the leg ?signs and symptoms of a stroke like changes in vision; confusion; trouble speaking or understanding; severe headaches; sudden numbness or weakness of the face, arm or leg; trouble walking; dizziness; loss of balance or coordination ?stomach pain ?sweating ?swelling of legs or ankles ?vomiting ?weight gain ?Side effects that usually do not require medical attention (report to your doctor or health care professional if they continue or are bothersome): ?back pain ?changes in taste ?decreased appetite ?dry  skin ?nausea ?tiredness ?This list may not describe all possible side effects. Call your doctor for medical advice about side effects. You may report side effects to FDA at 1-800-FDA-1088. ?Where should I keep my

## 2021-08-18 LAB — CEA: CEA: 11.6 ng/mL — ABNORMAL HIGH (ref 0.0–4.7)

## 2021-08-19 ENCOUNTER — Inpatient Hospital Stay: Payer: Medicare Other

## 2021-08-19 DIAGNOSIS — Z85038 Personal history of other malignant neoplasm of large intestine: Secondary | ICD-10-CM

## 2021-08-19 DIAGNOSIS — C786 Secondary malignant neoplasm of retroperitoneum and peritoneum: Secondary | ICD-10-CM

## 2021-08-19 DIAGNOSIS — Z5112 Encounter for antineoplastic immunotherapy: Secondary | ICD-10-CM | POA: Diagnosis not present

## 2021-08-19 MED ORDER — HEPARIN SOD (PORK) LOCK FLUSH 100 UNIT/ML IV SOLN
500.0000 [IU] | Freq: Once | INTRAVENOUS | Status: AC | PRN
  Administered 2021-08-19: 500 [IU]
  Filled 2021-08-19: qty 5

## 2021-08-19 MED ORDER — SODIUM CHLORIDE 0.9% FLUSH
10.0000 mL | INTRAVENOUS | Status: DC | PRN
  Administered 2021-08-19: 10 mL
  Filled 2021-08-19: qty 10

## 2021-08-31 ENCOUNTER — Inpatient Hospital Stay: Payer: Medicare Other

## 2021-08-31 ENCOUNTER — Encounter: Payer: Self-pay | Admitting: Oncology

## 2021-08-31 ENCOUNTER — Inpatient Hospital Stay (HOSPITAL_BASED_OUTPATIENT_CLINIC_OR_DEPARTMENT_OTHER): Payer: Medicare Other | Admitting: Oncology

## 2021-08-31 VITALS — BP 124/72 | HR 86 | Temp 97.5°F | Resp 18 | Wt 250.8 lb

## 2021-08-31 DIAGNOSIS — C786 Secondary malignant neoplasm of retroperitoneum and peritoneum: Secondary | ICD-10-CM

## 2021-08-31 DIAGNOSIS — C189 Malignant neoplasm of colon, unspecified: Secondary | ICD-10-CM

## 2021-08-31 DIAGNOSIS — Z5111 Encounter for antineoplastic chemotherapy: Secondary | ICD-10-CM

## 2021-08-31 DIAGNOSIS — Z5112 Encounter for antineoplastic immunotherapy: Secondary | ICD-10-CM | POA: Diagnosis not present

## 2021-08-31 DIAGNOSIS — D6481 Anemia due to antineoplastic chemotherapy: Secondary | ICD-10-CM | POA: Diagnosis not present

## 2021-08-31 DIAGNOSIS — G620 Drug-induced polyneuropathy: Secondary | ICD-10-CM

## 2021-08-31 DIAGNOSIS — T451X5A Adverse effect of antineoplastic and immunosuppressive drugs, initial encounter: Secondary | ICD-10-CM

## 2021-08-31 DIAGNOSIS — Z85038 Personal history of other malignant neoplasm of large intestine: Secondary | ICD-10-CM

## 2021-08-31 LAB — CBC WITH DIFFERENTIAL/PLATELET
Abs Immature Granulocytes: 0.01 10*3/uL (ref 0.00–0.07)
Basophils Absolute: 0 10*3/uL (ref 0.0–0.1)
Basophils Relative: 1 %
Eosinophils Absolute: 0.1 10*3/uL (ref 0.0–0.5)
Eosinophils Relative: 3 %
HCT: 39.8 % (ref 39.0–52.0)
Hemoglobin: 13.1 g/dL (ref 13.0–17.0)
Immature Granulocytes: 0 %
Lymphocytes Relative: 36 %
Lymphs Abs: 1.6 10*3/uL (ref 0.7–4.0)
MCH: 32 pg (ref 26.0–34.0)
MCHC: 32.9 g/dL (ref 30.0–36.0)
MCV: 97.1 fL (ref 80.0–100.0)
Monocytes Absolute: 0.3 10*3/uL (ref 0.1–1.0)
Monocytes Relative: 7 %
Neutro Abs: 2.5 10*3/uL (ref 1.7–7.7)
Neutrophils Relative %: 53 %
Platelets: 162 10*3/uL (ref 150–400)
RBC: 4.1 MIL/uL — ABNORMAL LOW (ref 4.22–5.81)
RDW: 14.2 % (ref 11.5–15.5)
WBC: 4.6 10*3/uL (ref 4.0–10.5)
nRBC: 0 % (ref 0.0–0.2)

## 2021-08-31 LAB — COMPREHENSIVE METABOLIC PANEL
ALT: 39 U/L (ref 0–44)
AST: 56 U/L — ABNORMAL HIGH (ref 15–41)
Albumin: 3.6 g/dL (ref 3.5–5.0)
Alkaline Phosphatase: 38 U/L (ref 38–126)
Anion gap: 9 (ref 5–15)
BUN: 13 mg/dL (ref 8–23)
CO2: 24 mmol/L (ref 22–32)
Calcium: 9.4 mg/dL (ref 8.9–10.3)
Chloride: 102 mmol/L (ref 98–111)
Creatinine, Ser: 0.94 mg/dL (ref 0.61–1.24)
GFR, Estimated: >60 mL/min (ref >60)
Glucose, Bld: 193 mg/dL — ABNORMAL HIGH (ref 70–99)
Potassium: 3.6 mmol/L (ref 3.5–5.1)
Sodium: 135 mmol/L (ref 135–145)
Total Bilirubin: 0.9 mg/dL (ref 0.3–1.2)
Total Protein: 6.8 g/dL (ref 6.5–8.1)

## 2021-08-31 LAB — PROTEIN, URINE, RANDOM: Total Protein, Urine: 35 mg/dL

## 2021-08-31 MED ORDER — FLUOROURACIL CHEMO INJECTION 2.5 GM/50ML
400.0000 mg/m2 | Freq: Once | INTRAVENOUS | Status: AC
  Administered 2021-08-31: 950 mg via INTRAVENOUS
  Filled 2021-08-31: qty 19

## 2021-08-31 MED ORDER — SODIUM CHLORIDE 0.9 % IV SOLN
10.0000 mg | Freq: Once | INTRAVENOUS | Status: AC
  Administered 2021-08-31: 10 mg via INTRAVENOUS
  Filled 2021-08-31: qty 10

## 2021-08-31 MED ORDER — SODIUM CHLORIDE 0.9 % IV SOLN
Freq: Once | INTRAVENOUS | Status: AC
  Filled 2021-08-31: qty 250

## 2021-08-31 MED ORDER — SODIUM CHLORIDE 0.9 % IV SOLN
2400.0000 mg/m2 | INTRAVENOUS | Status: DC
  Administered 2021-08-31: 5700 mg via INTRAVENOUS
  Filled 2021-08-31: qty 114

## 2021-08-31 MED ORDER — SODIUM CHLORIDE 0.9 % IV SOLN
5.0000 mg/kg | Freq: Once | INTRAVENOUS | Status: AC
  Administered 2021-08-31: 600 mg via INTRAVENOUS
  Filled 2021-08-31: qty 16

## 2021-08-31 MED ORDER — SODIUM CHLORIDE 0.9 % IV SOLN
950.0000 mg | Freq: Once | INTRAVENOUS | Status: AC
  Administered 2021-08-31: 950 mg via INTRAVENOUS
  Filled 2021-08-31: qty 47.5

## 2021-08-31 NOTE — Progress Notes (Signed)
? ?Hematology/Oncology Progress note ?[phone] [phone] ?  ? ? ? ?Clinic Day: 08/31/2021  ? ?Referring physician: Tracie Harrier, MD ? ?Chief Complaint: Jared Gutierrez is a 81 y.o. male presents for follow-up of metastatic colon cancer  ? ?PERTINENT ONCOLOGY HISTORY ?Jared Gutierrez is a 81 y.o.[place] who has above oncology history reviewed by me today presented for follow up visit for management of  ?Metastatic colon cancer. ?[name], patient switched care to me on 11/11/20 ?Extensive medical record review was performed by me ? ?# Metastatic colon cancer.  he was diagnosed with stage I colon cancer s/p transverse colectomy on 10/17/2013.  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  Tumor extended into the submucosa. Margins were negative. + lymphovascular invasion. 14 lymph nodes were negative. Pathologic stage was T1 N0. ?  ?10/02/2014 Colonoscopy showed several 3 mm polyps and an 8 mm polyp in the cecum and transverse colon.  Pathology revealed tubular adenomas negative for high-grade dysplasia and malignancy.  ?02/16/2017 Colonoscopy  revealed 2 diminutive polyps in the descending colon.  Pathology revealed tubular adenomas without dysplasia or malignancy. ? ?Her CEA has been monitored and was noted to increase starting July 2020. ?04/02/2019  PET scan  limited evealed a 2.4 x 2.3 cm (SUV 11) hypermetabolic soft tissue density caudal and anterior to the pancreatic neck favored to represent isolated peritoneal or nodal metastasis in the setting of prior transverse colonic resection (expected primary drainage). Although this was immediately adjacent to the pancreas, a fat plane was maintained, arguing strongly against a pancreatic primary. Otherwise, there was no evidence of hypermetabolic metastasis. ?  ?04/17/2019 EUS on  revealed a normal  esophagus, stomach, duodenum, and pancreas.  There was a 2.4 x 2.4 cm irregular mass in the retroperitoneum adjacent to, but not involving the pancreatic neck.  FNA and core needle biopsy were performed.  Pathology revealed adenocarcinoma compatible with a metastatic lesion of colorectal origin.  Tumor cells were positive for CK20 and CDX2 and negative for CK7. ? ? ?NGS: [employer] on 05/15/2019 revealed + KRASG13D and TP53.  Negative results included BRAF V600E, Her2, NRAS, NTRK, PD-L1 (<1%), and TMB 8.7/Mb (intermediate).  MMR testing from his colon resection on 10/16/2013 was intact with a low probability of MSI-H. ? ?06/25/2019, CEA 17.8. ?07/09/2019 - 08/27/2019; 09/17/2019 - 10/15/2019; 11/12/2019 - 12/17/2019 He received 11 cycles of FOLFOX chemotherapy and 1 cycle of 5-FU and leucovorin (12/31/2019).  He received Neulasta after cycle #4 and #5 secondary to progressive leukopenia.  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  He received a truncated course of FOLFOX with cycle #11 secondary to oxaliplatin reaction. ? ?08/27/2019, CEA 6.1 ?10/29/2019, CEA 7.7 ?12/31/2019, CEA 10.8. ? ?01/14/2020 PET revealed an interval decrease in size and FDG uptake (2.4 x 2.3 cm with SUV 11 to 2.4 x 1.7 cm with SUV 4.09) associated with the previously referenced soft tissue density caudal and anterior to the pancreatic neck suggesting treatment response. There were no new sites of FDG avid tumor. ? ?Radiation treatment ?02/10/2020 - 02/23/2020.   ?He received a hybrid 3-dimensional course of 3000 cGy in 10 fractions to the residual FDG avid mass  ? ?05/27/2020, CEA 11.6 ?05/27/2020 Abdomen and pelvis CT :stable to minimally increased (2.9 x 1.8 cm compared to 2.4 x 1.7 cm) since 01/14/2020.  There were no new interval findings.   ? ?08/31/2020, CEA 30.9. ?08/31/2020 Abdomen  and pelvis CT revealed increased size of the index soft tissue lesion inferior and anterior to the pancreatic neck (2.9 x 1.8 cm to 3.5 x 2.9 cm). There  were similar prominent retroperitoneal lymph nodes without adenopathy by size criteria. There was no new or enlarging abdominal or pelvic lymph nodes. There were no new interval findings. There was similar circumferential wall thickening of a nondistended urinary bladder, which likely accentuated wall thickening There was hepatic steatosis and aortic atherosclerosis. ? 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  ?The lesion is 3.8 x 2.9 cm with SUV of 11.3. ?No evidence of metastatic peritoneal disease elsewhere in the abdomen pelvis.  No evidence of distant metastasis. ? ?Other medical problems ?Chronic lower extremity edema. ?12/24/2018, right lower extremity duplex negative for DVT.  Small right Baker's cyst. ?08/16/2019, bilateral lower extremity duplex showed no DVT. ? ?08/16/2019 - 08/17/2019 with right lower extremity cellulitis.  He was unable to bear weight.  He was treated with IV fluids, NSAIDs, colchicine, and broad antibiotics (vancomycin and Cefepime).  He was discharged on indomethacin x 5 days and Keflex 500 mg TID x 5 days. ? ?10/05/2020, colonoscopy showed internal hemorrhoids.  Otherwise normal examination. 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 ?Given that he has no other distant metastasis.  Repeat SBRT may be considered if he does not respond well to systemic chemotherapy.  Discussed with radiation oncology. ? ?11/26/2020 patient was started on 5-FU and bevacizumab. ?12/14/2020 CEA 29.5 ?02/17/2021 CT abdomen pelvis showed partial response. Mild decrease of peritoneal lesion.  ?Proceed with 5-FU and bevacizumab today.  ?Given that he is tolerating current regimen with good life quality, partial response.  Shared decision was made to hold off adding additional agents. ? ?06/08/2021, CT chest abdomen pelvis with contrast showed soft tissue mass at the base of mesentery inferior to the pancreatic neck is unchanged  in size.  No progressive disease was identified. ?Labs reviewed and discussed with patient ? ?INTERVAL HISTORY ?KEISON GLENDINNING is a 81 y.o. male who has above history reviewed by me today presents for follow up visit for management of recurrent metastatic colon cancer ?He was accompanied by wife ?Patient initially reports some generalized discomfort of her abdomen today.  Discomfort went away after moved a bowel movement in the clinic here.  Small amount of blood in the stool, after straining. ?Otherwise no new complaints.  No nausea vomiting diarrhea fever or chills. ? ? ? ?Review of Systems  ?Constitutional:  Negative for appetite change, chills, diaphoresis, fever and unexpected weight change.  ?HENT:   Negative for hearing loss, nosebleeds, sore throat and tinnitus.   ?Respiratory:  Negative for cough, hemoptysis and shortness of breath.   ?Cardiovascular:  Positive for leg swelling. Negative for chest pain and palpitations.  ?Gastrointestinal:  Negative for abdominal pain, blood in stool, constipation, diarrhea, nausea and vomiting.  ?Genitourinary:  Negative for dysuria, frequency and hematuria.   ?Musculoskeletal:  Positive for arthralgias. Negative for back pain, myalgias and neck pain.  ?Skin:  Negative for itching and rash.  ?Neurological:  Positive for numbness. Negative for dizziness and headaches.  ?Hematological:  Does not bruise/bleed easily.  ?Psychiatric/Behavioral:  Negative for depression. The patient is not nervous/anxious.   ? ? ? ?Past Medical History:  ?Diagnosis Date  ? Arthritis   ? OSTEOARTHRITIS  ? Cancer [facility])   ? Cavitary lesion of lung   ? RIGHT LOWER LOBE  ? Chicken pox   ?  Colon cancer ([place])   ? History of kidney stones   ? Hypertension   ? Lipoma of colon   ? Nephrolithiasis   ? Nephrolithiasis   ? Obesity   ? Shingles   ? Tubular adenoma of colon   ? multiple fragments  ? ? ?Past Surgical History:  ?Procedure Laterality Date  ? COLON SURGERY    ? COLONOSCOPY N/A 10/02/2014  ?  Procedure: COLONOSCOPY;  Surgeon: Josefine Class, MD;  Location: [facility] ENDOSCOPY;  Service: Endoscopy;  Laterality: N/A;  ? COLONOSCOPY WITH PROPOFOL N/A 02/16/2017  ? Procedure: COLONOSCOPY WITH PROPOFOL;  Sur

## 2021-08-31 NOTE — Patient Instructions (Signed)
[facility] CANCER CTR AT [place]  Discharge Instructions: ?Thank you for choosing [facility] to provide your oncology and hematology care.  ?If you have a lab appointment with the [facility], please go directly to the [facility] and check in at the registration area. ? ?Wear comfortable clothing and clothing appropriate for easy access to any Portacath or PICC line.  ? ?We strive to give you quality time with your provider. You may need to reschedule your appointment if you arrive late (15 or more minutes).  Arriving late affects you and other patients whose appointments are after yours.  Also, if you miss three or more appointments without notifying the office, you may be dismissed from the clinic at the provider?s discretion.    ?  ?For prescription refill requests, have your pharmacy contact our office and allow 72 hours for refills to be completed.   ? ?Today you received the following chemotherapy and/or immunotherapy agents Mvasi, Leucovorin and Adrucil    ?  ?To help prevent nausea and vomiting after your treatment, we encourage you to take your nausea medication as directed. ? ?BELOW ARE SYMPTOMS THAT SHOULD BE REPORTED IMMEDIATELY: ?*FEVER GREATER THAN 100.4 F (38 ?C) OR HIGHER ?*CHILLS OR SWEATING ?*NAUSEA AND VOMITING THAT IS NOT CONTROLLED WITH YOUR NAUSEA MEDICATION ?*UNUSUAL SHORTNESS OF BREATH ?*UNUSUAL BRUISING OR BLEEDING ?*URINARY PROBLEMS (pain or burning when urinating, or frequent urination) ?*BOWEL PROBLEMS (unusual diarrhea, constipation, pain near the anus) ?TENDERNESS IN MOUTH AND THROAT WITH OR WITHOUT PRESENCE OF ULCERS (sore throat, sores in mouth, or a toothache) ?UNUSUAL RASH, SWELLING OR PAIN  ?UNUSUAL VAGINAL DISCHARGE OR ITCHING  ? ?Items with * indicate a potential emergency and should be followed up as soon as possible or go to the Emergency Department if any problems should occur. ? ?Please show the CHEMOTHERAPY ALERT CARD or IMMUNOTHERAPY ALERT  CARD at check-in to the Emergency Department and triage nurse. ? ?Should you have questions after your visit or need to cancel or reschedule your appointment, please contact [facility] CANCER [place] AT [place]  [phone] and follow the prompts.  Office hours are 8:00 a.m. to 4:30 p.m. Monday - Friday. Please note that voicemails left after 4:00 p.m. may not be returned until the following business day.  We are closed weekends and major holidays. You have access to a nurse at all times for urgent questions. Please call the main number to the clinic [phone] and follow the prompts. ? ?For any non-urgent questions, you may also contact your provider using MyChart. We now offer e-Visits for anyone 99 and older to request care online for non-urgent symptoms. For details visit [email or web address]. ?  ?Also download the MyChart app! Go to the app store, search "MyChart", open the app, select [facility], and log in with your MyChart username and password. ? ?Due to Covid, a mask is required upon entering the hospital/clinic. If you do not have a mask, one will be given to you upon arrival. For doctor visits, patients may have 1 support person aged 47 or older with them. For treatment visits, patients cannot have anyone with them due to current Covid guidelines and our immunocompromised population.  ?

## 2021-08-31 NOTE — Progress Notes (Signed)
Pt here for follow up. Pt reports that he has seen some blood in stool, possibly due to straining.  ?

## 2021-09-01 LAB — CEA: CEA: 11.7 ng/mL — ABNORMAL HIGH (ref 0.0–4.7)

## 2021-09-02 ENCOUNTER — Inpatient Hospital Stay: Payer: Medicare Other

## 2021-09-02 ENCOUNTER — Ambulatory Visit: Payer: Medicare Other

## 2021-09-02 VITALS — BP 156/76 | HR 65 | Temp 96.3°F | Resp 20

## 2021-09-02 DIAGNOSIS — Z85038 Personal history of other malignant neoplasm of large intestine: Secondary | ICD-10-CM

## 2021-09-02 DIAGNOSIS — Z5112 Encounter for antineoplastic immunotherapy: Secondary | ICD-10-CM | POA: Diagnosis not present

## 2021-09-02 DIAGNOSIS — C786 Secondary malignant neoplasm of retroperitoneum and peritoneum: Secondary | ICD-10-CM

## 2021-09-02 MED ORDER — SODIUM CHLORIDE 0.9% FLUSH
10.0000 mL | INTRAVENOUS | Status: DC | PRN
  Administered 2021-09-02: 10 mL
  Filled 2021-09-02: qty 10

## 2021-09-02 MED ORDER — HEPARIN SOD (PORK) LOCK FLUSH 100 UNIT/ML IV SOLN
500.0000 [IU] | Freq: Once | INTRAVENOUS | Status: AC | PRN
  Administered 2021-09-02: 500 [IU]
  Filled 2021-09-02: qty 5

## 2021-09-07 ENCOUNTER — Ambulatory Visit
Admission: RE | Admit: 2021-09-07 | Discharge: 2021-09-07 | Disposition: A | Discharge status: Home / Self Care | Payer: Medicare Other | Source: Ambulatory Visit | Attending: Oncology | Admitting: Oncology

## 2021-09-07 DIAGNOSIS — C189 Malignant neoplasm of colon, unspecified: Secondary | ICD-10-CM | POA: Insufficient documentation

## 2021-09-07 MED ORDER — IOHEXOL 300 MG/ML  SOLN
100.0000 mL | Freq: Once | INTRAMUSCULAR | Status: AC | PRN
  Administered 2021-09-07: 100 mL via INTRAVENOUS

## 2021-09-08 ENCOUNTER — Other Ambulatory Visit: Payer: Self-pay | Admitting: Oncology

## 2021-09-14 ENCOUNTER — Encounter: Payer: Self-pay | Admitting: Oncology

## 2021-09-14 ENCOUNTER — Inpatient Hospital Stay: Payer: Medicare Other

## 2021-09-14 ENCOUNTER — Inpatient Hospital Stay: Payer: Medicare Other | Attending: Oncology

## 2021-09-14 ENCOUNTER — Inpatient Hospital Stay (HOSPITAL_BASED_OUTPATIENT_CLINIC_OR_DEPARTMENT_OTHER): Payer: Medicare Other | Admitting: Oncology

## 2021-09-14 VITALS — BP 122/84 | HR 67 | Temp 96.6°F | Resp 18 | Wt 248.2 lb

## 2021-09-14 DIAGNOSIS — C786 Secondary malignant neoplasm of retroperitoneum and peritoneum: Secondary | ICD-10-CM

## 2021-09-14 DIAGNOSIS — C184 Malignant neoplasm of transverse colon: Secondary | ICD-10-CM

## 2021-09-14 DIAGNOSIS — Z803 Family history of malignant neoplasm of breast: Secondary | ICD-10-CM | POA: Diagnosis not present

## 2021-09-14 DIAGNOSIS — E669 Obesity, unspecified: Secondary | ICD-10-CM | POA: Insufficient documentation

## 2021-09-14 DIAGNOSIS — D6481 Anemia due to antineoplastic chemotherapy: Secondary | ICD-10-CM | POA: Diagnosis not present

## 2021-09-14 DIAGNOSIS — Z86010 Personal history of colonic polyps: Secondary | ICD-10-CM | POA: Diagnosis not present

## 2021-09-14 DIAGNOSIS — Z809 Family history of malignant neoplasm, unspecified: Secondary | ICD-10-CM | POA: Insufficient documentation

## 2021-09-14 DIAGNOSIS — G620 Drug-induced polyneuropathy: Secondary | ICD-10-CM | POA: Diagnosis not present

## 2021-09-14 DIAGNOSIS — C189 Malignant neoplasm of colon, unspecified: Secondary | ICD-10-CM

## 2021-09-14 DIAGNOSIS — T451X5A Adverse effect of antineoplastic and immunosuppressive drugs, initial encounter: Secondary | ICD-10-CM | POA: Diagnosis not present

## 2021-09-14 DIAGNOSIS — Z5111 Encounter for antineoplastic chemotherapy: Secondary | ICD-10-CM

## 2021-09-14 DIAGNOSIS — Z85038 Personal history of other malignant neoplasm of large intestine: Secondary | ICD-10-CM

## 2021-09-14 DIAGNOSIS — T451X5D Adverse effect of antineoplastic and immunosuppressive drugs, subsequent encounter: Secondary | ICD-10-CM | POA: Diagnosis not present

## 2021-09-14 DIAGNOSIS — Z5112 Encounter for antineoplastic immunotherapy: Secondary | ICD-10-CM | POA: Diagnosis present

## 2021-09-14 DIAGNOSIS — Z79899 Other long term (current) drug therapy: Secondary | ICD-10-CM | POA: Insufficient documentation

## 2021-09-14 DIAGNOSIS — Z836 Family history of other diseases of the respiratory system: Secondary | ICD-10-CM | POA: Diagnosis not present

## 2021-09-14 LAB — CBC WITH DIFFERENTIAL/PLATELET
Abs Immature Granulocytes: 0.02 10*3/uL (ref 0.00–0.07)
Basophils Absolute: 0 10*3/uL (ref 0.0–0.1)
Basophils Relative: 1 %
Eosinophils Absolute: 0.1 10*3/uL (ref 0.0–0.5)
Eosinophils Relative: 2 %
HCT: 37.2 % — ABNORMAL LOW (ref 39.0–52.0)
Hemoglobin: 12.7 g/dL — ABNORMAL LOW (ref 13.0–17.0)
Immature Granulocytes: 1 %
Lymphocytes Relative: 30 %
Lymphs Abs: 1.2 10*3/uL (ref 0.7–4.0)
MCH: 32.4 pg (ref 26.0–34.0)
MCHC: 34.1 g/dL (ref 30.0–36.0)
MCV: 94.9 fL (ref 80.0–100.0)
Monocytes Absolute: 0.4 10*3/uL (ref 0.1–1.0)
Monocytes Relative: 10 %
Neutro Abs: 2.2 10*3/uL (ref 1.7–7.7)
Neutrophils Relative %: 56 %
Platelets: 144 10*3/uL — ABNORMAL LOW (ref 150–400)
RBC: 3.92 MIL/uL — ABNORMAL LOW (ref 4.22–5.81)
RDW: 13.9 % (ref 11.5–15.5)
WBC: 3.9 10*3/uL — ABNORMAL LOW (ref 4.0–10.5)
nRBC: 0 % (ref 0.0–0.2)

## 2021-09-14 LAB — COMPREHENSIVE METABOLIC PANEL WITH GFR
ALT: 39 U/L (ref 0–44)
AST: 63 U/L — ABNORMAL HIGH (ref 15–41)
Albumin: 3.6 g/dL (ref 3.5–5.0)
Alkaline Phosphatase: 36 U/L — ABNORMAL LOW (ref 38–126)
Anion gap: 4 — ABNORMAL LOW (ref 5–15)
BUN: 9 mg/dL (ref 8–23)
CO2: 25 mmol/L (ref 22–32)
Calcium: 8.9 mg/dL (ref 8.9–10.3)
Chloride: 105 mmol/L (ref 98–111)
Creatinine, Ser: 0.69 mg/dL (ref 0.61–1.24)
GFR, Estimated: >60 mL/min
Glucose, Bld: 122 mg/dL — ABNORMAL HIGH (ref 70–99)
Potassium: 3.7 mmol/L (ref 3.5–5.1)
Sodium: 134 mmol/L — ABNORMAL LOW (ref 135–145)
Total Bilirubin: 1.3 mg/dL — ABNORMAL HIGH (ref 0.3–1.2)
Total Protein: 6.7 g/dL (ref 6.5–8.1)

## 2021-09-14 LAB — PROTEIN, URINE, RANDOM: Total Protein, Urine: 14 mg/dL

## 2021-09-14 MED ORDER — SODIUM CHLORIDE 0.9 % IV SOLN
Freq: Once | INTRAVENOUS | Status: AC
  Filled 2021-09-14: qty 250

## 2021-09-14 MED ORDER — SODIUM CHLORIDE 0.9 % IV SOLN
950.0000 mg | Freq: Once | INTRAVENOUS | Status: AC
  Administered 2021-09-14: 950 mg via INTRAVENOUS
  Filled 2021-09-14: qty 47.5

## 2021-09-14 MED ORDER — SODIUM CHLORIDE 0.9 % IV SOLN
10.0000 mg | Freq: Once | INTRAVENOUS | Status: AC
  Administered 2021-09-14: 10 mg via INTRAVENOUS
  Filled 2021-09-14: qty 10

## 2021-09-14 MED ORDER — SODIUM CHLORIDE 0.9 % IV SOLN
5.0000 mg/kg | Freq: Once | INTRAVENOUS | Status: AC
  Administered 2021-09-14: 600 mg via INTRAVENOUS
  Filled 2021-09-14: qty 16

## 2021-09-14 MED ORDER — SODIUM CHLORIDE 0.9 % IV SOLN
2400.0000 mg/m2 | INTRAVENOUS | Status: DC
  Administered 2021-09-14: 5700 mg via INTRAVENOUS
  Filled 2021-09-14: qty 114

## 2021-09-14 MED ORDER — FLUOROURACIL CHEMO INJECTION 2.5 GM/50ML
400.0000 mg/m2 | Freq: Once | INTRAVENOUS | Status: AC
  Administered 2021-09-14: 950 mg via INTRAVENOUS
  Filled 2021-09-14: qty 19

## 2021-09-14 NOTE — Progress Notes (Signed)
Pt here for follow up. No new concerns voiced.   

## 2021-09-14 NOTE — Patient Instructions (Signed)
[facility] CANCER CTR AT [place]  Discharge Instructions: ?Thank you for choosing [facility] to provide your oncology and hematology care.  ?If you have a lab appointment with the [facility], please go directly to the [facility] and check in at the registration area. ? ?Wear comfortable clothing and clothing appropriate for easy access to any Portacath or PICC line.  ? ?We strive to give you quality time with your provider. You may need to reschedule your appointment if you arrive late (15 or more minutes).  Arriving late affects you and other patients whose appointments are after yours.  Also, if you miss three or more appointments without notifying the office, you may be dismissed from the clinic at the provider?s discretion.    ?  ?For prescription refill requests, have your pharmacy contact our office and allow 72 hours for refills to be completed.   ? ?Today you received the following chemotherapy and/or immunotherapy agents Bevacizumab, Leucovorin, & 5FU    ?  ?To help prevent nausea and vomiting after your treatment, we encourage you to take your nausea medication as directed. ? ?BELOW ARE SYMPTOMS THAT SHOULD BE REPORTED IMMEDIATELY: ?*FEVER GREATER THAN 100.4 F (38 ?C) OR HIGHER ?*CHILLS OR SWEATING ?*NAUSEA AND VOMITING THAT IS NOT CONTROLLED WITH YOUR NAUSEA MEDICATION ?*UNUSUAL SHORTNESS OF BREATH ?*UNUSUAL BRUISING OR BLEEDING ?*URINARY PROBLEMS (pain or burning when urinating, or frequent urination) ?*BOWEL PROBLEMS (unusual diarrhea, constipation, pain near the anus) ?TENDERNESS IN MOUTH AND THROAT WITH OR WITHOUT PRESENCE OF ULCERS (sore throat, sores in mouth, or a toothache) ?UNUSUAL RASH, SWELLING OR PAIN  ?UNUSUAL VAGINAL DISCHARGE OR ITCHING  ? ?Items with * indicate a potential emergency and should be followed up as soon as possible or go to the Emergency Department if any problems should occur. ? ?Please show the CHEMOTHERAPY ALERT CARD or IMMUNOTHERAPY ALERT  CARD at check-in to the Emergency Department and triage nurse. ? ?Should you have questions after your visit or need to cancel or reschedule your appointment, please contact [facility] CANCER [place] AT [place]  [phone] and follow the prompts.  Office hours are 8:00 a.m. to 4:30 p.m. Monday - Friday. Please note that voicemails left after 4:00 p.m. may not be returned until the following business day.  We are closed weekends and major holidays. You have access to a nurse at all times for urgent questions. Please call the main number to the clinic [phone] and follow the prompts. ? ?For any non-urgent questions, you may also contact your provider using MyChart. We now offer e-Visits for anyone 75 and older to request care online for non-urgent symptoms. For details visit [email or web address]. ?  ?Also download the MyChart app! Go to the app store, search "MyChart", open the app, select , and log in with your MyChart username and password. ? ?Due to Covid, a mask is required upon entering the hospital/clinic. If you do not have a mask, one will be given to you upon arrival. For doctor visits, patients may have 1 support person aged 47 or older with them. For treatment visits, patients cannot have anyone with them due to current Covid guidelines and our immunocompromised population.  ?

## 2021-09-14 NOTE — Progress Notes (Signed)
? ?Hematology/Oncology Progress note ?Telephone:([phone] [phone] ?  ? ? ? ?Clinic Day: 09/14/2021  ? ?Referring physician: Tracie Harrier, MD ? ?Chief Complaint: Jared Gutierrez is a 81 y.o. male presents for follow-up of metastatic colon cancer  ? ?PERTINENT ONCOLOGY HISTORY ?LEVIN DAGOSTINO is a 81 y.o.[place] who has above oncology history reviewed by me today presented for follow up visit for management of  ?Metastatic colon cancer. ?[name], patient switched care to me on 11/11/20 ?Extensive medical record review was performed by me ? ?# Metastatic colon cancer.  he was diagnosed with stage I colon cancer s/p transverse colectomy on 10/17/2013.  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  Tumor extended into the submucosa. Margins were negative. + lymphovascular invasion. 14 lymph nodes were negative. Pathologic stage was T1 N0. ?  ?10/02/2014 Colonoscopy showed several 3 mm polyps and an 8 mm polyp in the cecum and transverse colon.  Pathology revealed tubular adenomas negative for high-grade dysplasia and malignancy.  ?02/16/2017 Colonoscopy  revealed 2 diminutive polyps in the descending colon.  Pathology revealed tubular adenomas without dysplasia or malignancy. ? ?Her CEA has been monitored and was noted to increase starting July 2020. ?04/02/2019  PET scan  limited evealed a 2.4 x 2.3 cm (SUV 11) hypermetabolic soft tissue density caudal and anterior to the pancreatic neck favored to represent isolated peritoneal or nodal metastasis in the setting of prior transverse colonic resection (expected primary drainage). Although this was immediately adjacent to the pancreas, a fat plane was maintained, arguing strongly against a pancreatic primary. Otherwise, there was no evidence of hypermetabolic metastasis. ?  ?04/17/2019 EUS on  revealed a normal  esophagus, stomach, duodenum, and pancreas.  There was a 2.4 x 2.4 cm irregular mass in the retroperitoneum adjacent to, but not involving the pancreatic neck.  FNA and core needle biopsy were performed.  Pathology revealed adenocarcinoma compatible with a metastatic lesion of colorectal origin.  Tumor cells were positive for CK20 and CDX2 and negative for CK7. ? ? ?NGS: Omniseq on 05/15/2019 revealed + KRASG13D and TP53.  Negative results included BRAF V600E, Her2, NRAS, NTRK, PD-L1 (<1%), and TMB 8.7/Mb (intermediate).  MMR testing from his colon resection on 10/16/2013 was intact with a low probability of MSI-H. ? ?06/25/2019, CEA 17.8. ?07/09/2019 - 08/27/2019; 09/17/2019 - 10/15/2019; 11/12/2019 - 12/17/2019 He received 11 cycles of FOLFOX chemotherapy and 1 cycle of 5-FU and leucovorin (12/31/2019).  He received Neulasta after cycle #4 and #5 secondary to progressive leukopenia.  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  He received a truncated course of FOLFOX with cycle #11 secondary to oxaliplatin reaction. ? ?08/27/2019, CEA 6.1 ?10/29/2019, CEA 7.7 ?12/31/2019, CEA 10.8. ? ?01/14/2020 PET revealed an interval decrease in size and FDG uptake (2.4 x 2.3 cm with SUV 11 to 2.4 x 1.7 cm with SUV 4.09) associated with the previously referenced soft tissue density caudal and anterior to the pancreatic neck suggesting treatment response. There were no new sites of FDG avid tumor. ? ?Radiation treatment ?02/10/2020 - 02/23/2020.   ?He received a hybrid 3-dimensional course of 3000 cGy in 10 fractions to the residual FDG avid mass  ? ?05/27/2020, CEA 11.6 ?05/27/2020 Abdomen and pelvis CT :stable to minimally increased (2.9 x 1.8 cm compared to 2.4 x 1.7 cm) since 01/14/2020.  There were no new interval findings.   ? ?08/31/2020, CEA 30.9. ?08/31/2020 Abdomen  and pelvis CT revealed increased size of the index soft tissue lesion inferior and anterior to the pancreatic neck (2.9 x 1.8 cm to 3.5 x 2.9 cm). There  were similar prominent retroperitoneal lymph nodes without adenopathy by size criteria. There was no new or enlarging abdominal or pelvic lymph nodes. There were no new interval findings. There was similar circumferential wall thickening of a nondistended urinary bladder, which likely accentuated wall thickening There was hepatic steatosis and aortic atherosclerosis. ? 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  ?The lesion is 3.8 x 2.9 cm with SUV of 11.3. ?No evidence of metastatic peritoneal disease elsewhere in the abdomen pelvis.  No evidence of distant metastasis. ? ?Other medical problems ?Chronic lower extremity edema. ?12/24/2018, right lower extremity duplex negative for DVT.  Small right Baker's cyst. ?08/16/2019, bilateral lower extremity duplex showed no DVT. ? ?08/16/2019 - 08/17/2019 with right lower extremity cellulitis.  He was unable to bear weight.  He was treated with IV fluids, NSAIDs, colchicine, and broad antibiotics (vancomycin and Cefepime).  He was discharged on indomethacin x 5 days and Keflex 500 mg TID x 5 days. ? ?10/05/2020, colonoscopy showed internal hemorrhoids.  Otherwise normal examination. 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 ?Given that he has no other distant metastasis. Discussed with radiation oncology. Repeat SBRT may be considered if he does not respond well to systemic chemotherapy.   ? ?11/26/2020 patient was started on 5-FU and bevacizumab. ?12/14/2020 CEA 29.5 ?02/17/2021 CT abdomen pelvis showed partial response. Mild decrease of peritoneal lesion.  ?Proceed with 5-FU and bevacizumab today.  ?Given that he is tolerating current regimen with good life quality, partial response.  Shared decision was made to hold off adding additional chemotherapy agents. ? ?06/08/2021, CT chest abdomen pelvis with contrast showed soft tissue mass at the base of mesentery inferior to the pancreatic neck  is unchanged in size.  No progressive disease was identified. ? ? ?INTERVAL HISTORY ?GRACIN MCPARTLAND is a 81 y.o. male who has above history reviewed by me today presents for follow up visit for management of recurrent metastatic colon cancer ?Patient was accompanied by wife.  He tolerates chemotherapy well. ?During the interval patient had a CT scan done present to discuss results.  Denies any nausea vomiting diarrhea. ? ? ? ?Review of Systems  ?Constitutional:  Negative for appetite change, chills, diaphoresis, fever and unexpected weight change.  ?HENT:   Negative for hearing loss, nosebleeds, sore throat and tinnitus.   ?Respiratory:  Negative for cough, hemoptysis and shortness of breath.   ?Cardiovascular:  Positive for leg swelling. Negative for chest pain and palpitations.  ?Gastrointestinal:  Negative for abdominal pain, blood in stool, constipation, diarrhea, nausea and vomiting.  ?Genitourinary:  Negative for dysuria, frequency and hematuria.   ?Musculoskeletal:  Positive for arthralgias. Negative for back pain, myalgias and neck pain.  ?Skin:  Negative for itching and rash.  ?Neurological:  Positive for numbness. Negative for dizziness and headaches.  ?Hematological:  Does not bruise/bleed easily.  ?Psychiatric/Behavioral:  Negative for depression. The patient is not nervous/anxious.   ? ? ? ?Past Medical History:  ?Diagnosis Date  ? Arthritis   ? OSTEOARTHRITIS  ? Cancer [facility])   ? Cavitary lesion of lung   ? RIGHT LOWER LOBE  ? Chicken pox   ? Colon cancer ([place])   ? History of kidney stones   ? Hypertension   ? Lipoma of colon   ?  Nephrolithiasis   ? Nephrolithiasis   ? Obesity   ? Shingles   ? Tubular adenoma of colon   ? multiple fragments  ? ? ?Past Surgical History:  ?Procedure Laterality Date  ? COLON SURGERY    ? COLONOSCOPY N/A 10/02/2014  ? Procedure: COLONOSCOPY;  Surgeon: Josefine Class, MD;  Location[facility] ENDOSCOPY;  Service: Endoscopy;  Laterality: N/A;  ? COLONOSCOPY WITH PROPOFOL N/A  02/16/2017  ? Procedure: COLONOSCOPY WITH PROPOFOL;  Surgeon: Manya Silvas, MD;  Location: [facility] ENDOSCOPY;  Service: Endoscopy;  Laterality: N/A;  ? COLONOSCOPY WITH PROPOFOL N/A 10/05/2020  ? Procedure: C

## 2021-09-15 LAB — CEA: CEA: 10.4 ng/mL — ABNORMAL HIGH (ref 0.0–4.7)

## 2021-09-16 ENCOUNTER — Inpatient Hospital Stay: Payer: Medicare Other

## 2021-09-16 DIAGNOSIS — C786 Secondary malignant neoplasm of retroperitoneum and peritoneum: Secondary | ICD-10-CM

## 2021-09-16 DIAGNOSIS — Z5112 Encounter for antineoplastic immunotherapy: Secondary | ICD-10-CM | POA: Diagnosis not present

## 2021-09-16 DIAGNOSIS — Z85038 Personal history of other malignant neoplasm of large intestine: Secondary | ICD-10-CM

## 2021-09-16 MED ORDER — HEPARIN SOD (PORK) LOCK FLUSH 100 UNIT/ML IV SOLN
500.0000 [IU] | Freq: Once | INTRAVENOUS | Status: AC | PRN
  Administered 2021-09-16: 500 [IU]
  Filled 2021-09-16: qty 5

## 2021-09-16 MED ORDER — SODIUM CHLORIDE 0.9% FLUSH
10.0000 mL | INTRAVENOUS | Status: DC | PRN
  Administered 2021-09-16: 10 mL
  Filled 2021-09-16: qty 10

## 2021-09-21 NOTE — Telephone Encounter (Signed)
Component Ref Range & Units 7 d ago ?(09/14/21) 3 wk ago ?(08/31/21) 1 mo ago ?(08/17/21) 1 mo ago ?(08/03/21) 2 mo ago ?(07/20/21) 2 mo ago ?(07/06/21) 3 mo ago ?(06/10/21)  ?Potassium 3.5 - 5.1 mmol/L 3.7  3.6  3.8  3.5  4.0  3.8  4.0   ? ?

## 2021-09-26 ENCOUNTER — Encounter: Payer: Self-pay | Admitting: Oncology

## 2021-09-27 MED FILL — Dexamethasone Sodium Phosphate Inj 100 MG/10ML: INTRAMUSCULAR | Qty: 1 | Status: AC

## 2021-09-28 ENCOUNTER — Encounter: Payer: Self-pay | Admitting: Oncology

## 2021-09-28 ENCOUNTER — Inpatient Hospital Stay: Payer: Medicare Other

## 2021-09-28 ENCOUNTER — Inpatient Hospital Stay (HOSPITAL_BASED_OUTPATIENT_CLINIC_OR_DEPARTMENT_OTHER): Payer: Medicare Other | Admitting: Oncology

## 2021-09-28 VITALS — BP 124/72 | HR 80 | Temp 97.4°F | Resp 18 | Wt 252.8 lb

## 2021-09-28 DIAGNOSIS — Z5111 Encounter for antineoplastic chemotherapy: Secondary | ICD-10-CM

## 2021-09-28 DIAGNOSIS — T451X5D Adverse effect of antineoplastic and immunosuppressive drugs, subsequent encounter: Secondary | ICD-10-CM

## 2021-09-28 DIAGNOSIS — C786 Secondary malignant neoplasm of retroperitoneum and peritoneum: Secondary | ICD-10-CM | POA: Diagnosis not present

## 2021-09-28 DIAGNOSIS — C184 Malignant neoplasm of transverse colon: Secondary | ICD-10-CM | POA: Diagnosis not present

## 2021-09-28 DIAGNOSIS — K521 Toxic gastroenteritis and colitis: Secondary | ICD-10-CM

## 2021-09-28 DIAGNOSIS — Z5112 Encounter for antineoplastic immunotherapy: Secondary | ICD-10-CM | POA: Diagnosis not present

## 2021-09-28 DIAGNOSIS — D6481 Anemia due to antineoplastic chemotherapy: Secondary | ICD-10-CM | POA: Diagnosis not present

## 2021-09-28 DIAGNOSIS — T451X5A Adverse effect of antineoplastic and immunosuppressive drugs, initial encounter: Secondary | ICD-10-CM | POA: Insufficient documentation

## 2021-09-28 DIAGNOSIS — Z85038 Personal history of other malignant neoplasm of large intestine: Secondary | ICD-10-CM

## 2021-09-28 LAB — CBC WITH DIFFERENTIAL/PLATELET
Abs Immature Granulocytes: 0.01 10*3/uL (ref 0.00–0.07)
Basophils Absolute: 0 10*3/uL (ref 0.0–0.1)
Basophils Relative: 1 %
Eosinophils Absolute: 0.1 10*3/uL (ref 0.0–0.5)
Eosinophils Relative: 2 %
HCT: 37 % — ABNORMAL LOW (ref 39.0–52.0)
Hemoglobin: 12.2 g/dL — ABNORMAL LOW (ref 13.0–17.0)
Immature Granulocytes: 0 %
Lymphocytes Relative: 34 %
Lymphs Abs: 1.3 10*3/uL (ref 0.7–4.0)
MCH: 31.4 pg (ref 26.0–34.0)
MCHC: 33 g/dL (ref 30.0–36.0)
MCV: 95.4 fL (ref 80.0–100.0)
Monocytes Absolute: 0.3 10*3/uL (ref 0.1–1.0)
Monocytes Relative: 8 %
Neutro Abs: 2.2 10*3/uL (ref 1.7–7.7)
Neutrophils Relative %: 55 %
Platelets: 151 10*3/uL (ref 150–400)
RBC: 3.88 MIL/uL — ABNORMAL LOW (ref 4.22–5.81)
RDW: 14.2 % (ref 11.5–15.5)
WBC: 3.9 10*3/uL — ABNORMAL LOW (ref 4.0–10.5)
nRBC: 0 % (ref 0.0–0.2)

## 2021-09-28 LAB — COMPREHENSIVE METABOLIC PANEL
ALT: 27 U/L (ref 0–44)
AST: 47 U/L — ABNORMAL HIGH (ref 15–41)
Albumin: 3.5 g/dL (ref 3.5–5.0)
Alkaline Phosphatase: 36 U/L — ABNORMAL LOW (ref 38–126)
Anion gap: 3 — ABNORMAL LOW (ref 5–15)
BUN: 15 mg/dL (ref 8–23)
CO2: 22 mmol/L (ref 22–32)
Calcium: 8.5 mg/dL — ABNORMAL LOW (ref 8.9–10.3)
Chloride: 107 mmol/L (ref 98–111)
Creatinine, Ser: 0.75 mg/dL (ref 0.61–1.24)
GFR, Estimated: >60 mL/min (ref >60)
Glucose, Bld: 156 mg/dL — ABNORMAL HIGH (ref 70–99)
Potassium: 3.5 mmol/L (ref 3.5–5.1)
Sodium: 132 mmol/L — ABNORMAL LOW (ref 135–145)
Total Bilirubin: 0.9 mg/dL (ref 0.3–1.2)
Total Protein: 6.6 g/dL (ref 6.5–8.1)

## 2021-09-28 LAB — PROTEIN, URINE, RANDOM: Total Protein, Urine: 12 mg/dL

## 2021-09-28 MED ORDER — SODIUM CHLORIDE 0.9 % IV SOLN
950.0000 mg | Freq: Once | INTRAVENOUS | Status: AC
  Administered 2021-09-28: 950 mg via INTRAVENOUS
  Filled 2021-09-28: qty 47.5

## 2021-09-28 MED ORDER — LOPERAMIDE HCL 2 MG PO CAPS: 2.0000 mg | ORAL_CAPSULE | ORAL | 0 refills | Status: DC

## 2021-09-28 MED ORDER — SODIUM CHLORIDE 0.9 % IV SOLN
5.0000 mg/kg | Freq: Once | INTRAVENOUS | Status: AC
  Administered 2021-09-28: 600 mg via INTRAVENOUS
  Filled 2021-09-28: qty 16

## 2021-09-28 MED ORDER — SODIUM CHLORIDE 0.9% FLUSH
10.0000 mL | INTRAVENOUS | Status: DC | PRN
  Administered 2021-09-28: 10 mL via INTRAVENOUS
  Filled 2021-09-28: qty 10

## 2021-09-28 MED ORDER — SODIUM CHLORIDE 0.9 % IV SOLN
10.0000 mg | Freq: Once | INTRAVENOUS | Status: AC
  Administered 2021-09-28: 10 mg via INTRAVENOUS
  Filled 2021-09-28: qty 10

## 2021-09-28 MED ORDER — SODIUM CHLORIDE 0.9 % IV SOLN
Freq: Once | INTRAVENOUS | Status: AC
  Filled 2021-09-28: qty 250

## 2021-09-28 MED ORDER — SODIUM CHLORIDE 0.9 % IV SOLN
2400.0000 mg/m2 | INTRAVENOUS | Status: DC
  Administered 2021-09-28: 5700 mg via INTRAVENOUS
  Filled 2021-09-28: qty 114

## 2021-09-28 MED ORDER — FLUOROURACIL CHEMO INJECTION 2.5 GM/50ML
400.0000 mg/m2 | Freq: Once | INTRAVENOUS | Status: AC
  Administered 2021-09-28: 950 mg via INTRAVENOUS
  Filled 2021-09-28: qty 19

## 2021-09-28 NOTE — Progress Notes (Signed)
? ?Hematology/Oncology Progress note ?Telephone:(336) [phone] [phone] ?  ? ? ? ?Clinic Day: 09/28/2021  ? ?Referring physician: Tracie Harrier, MD ? ?Chief Complaint: Jared Gutierrez is a 81 y.o. male presents for follow-up of metastatic colon cancer  ? ?PERTINENT ONCOLOGY HISTORY ?Jared Gutierrez is a 81 y.o.[place] who has above oncology history reviewed by me today presented for follow up visit for management of  ?Metastatic colon cancer. ?[name], patient switched care to me on 11/11/20 ?Extensive medical record review was performed by me ? ?# Metastatic colon cancer.  he was diagnosed with stage I colon cancer s/p transverse colectomy on 10/17/2013.  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  Tumor extended into the submucosa. Margins were negative. + lymphovascular invasion. 14 lymph nodes were negative. Pathologic stage was T1 N0. ?  ?10/02/2014 Colonoscopy showed several 3 mm polyps and an 8 mm polyp in the cecum and transverse colon.  Pathology revealed tubular adenomas negative for high-grade dysplasia and malignancy.  ?02/16/2017 Colonoscopy  revealed 2 diminutive polyps in the descending colon.  Pathology revealed tubular adenomas without dysplasia or malignancy. ? ?Her CEA has been monitored and was noted to increase starting July 2020. ?04/02/2019  PET scan  limited evealed a 2.4 x 2.3 cm (SUV 11) hypermetabolic soft tissue density caudal and anterior to the pancreatic neck favored to represent isolated peritoneal or nodal metastasis in the setting of prior transverse colonic resection (expected primary drainage). Although this was immediately adjacent to the pancreas, a fat plane was maintained, arguing strongly against a pancreatic primary. Otherwise, there was no evidence of hypermetabolic metastasis. ?  ?04/17/2019 EUS on  revealed a normal  esophagus, stomach, duodenum, and pancreas.  There was a 2.4 x 2.4 cm irregular mass in the retroperitoneum adjacent to, but not involving the pancreatic neck.  FNA and core needle biopsy were performed.  Pathology revealed adenocarcinoma compatible with a metastatic lesion of colorectal origin.  Tumor cells were positive for CK20 and CDX2 and negative for CK7. ? ? ?NGS: Omniseq on 05/15/2019 revealed + KRASG13D and TP53.  Negative results included BRAF V600E, Her2, NRAS, NTRK, PD-L1 (<1%), and TMB 8.7/Mb (intermediate).  MMR testing from his colon resection on 10/16/2013 was intact with a low probability of MSI-H. ? ?06/25/2019, CEA 17.8. ?07/09/2019 - 08/27/2019; 09/17/2019 - 10/15/2019; 11/12/2019 - 12/17/2019 He received 11 cycles of FOLFOX chemotherapy and 1 cycle of 5-FU and leucovorin (12/31/2019).  He received Neulasta after cycle #4 and #5 secondary to progressive leukopenia.  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  He received a truncated course of FOLFOX with cycle #11 secondary to oxaliplatin reaction. ? ?08/27/2019, CEA 6.1 ?10/29/2019, CEA 7.7 ?12/31/2019, CEA 10.8. ? ?01/14/2020 PET revealed an interval decrease in size and FDG uptake (2.4 x 2.3 cm with SUV 11 to 2.4 x 1.7 cm with SUV 4.09) associated with the previously referenced soft tissue density caudal and anterior to the pancreatic neck suggesting treatment response. There were no new sites of FDG avid tumor. ? ?Radiation treatment ?02/10/2020 - 02/23/2020.   ?He received a hybrid 3-dimensional course of 3000 cGy in 10 fractions to the residual FDG avid mass  ? ?05/27/2020, CEA 11.6 ?05/27/2020 Abdomen and pelvis CT :stable to minimally increased (2.9 x 1.8 cm compared to 2.4 x 1.7 cm) since 01/14/2020.  There were no new interval findings.   ? ?08/31/2020, CEA 30.9. ?08/31/2020 Abdomen  and pelvis CT revealed increased size of the index soft tissue lesion inferior and anterior to the pancreatic neck (2.9 x 1.8 cm to 3.5 x 2.9 cm). There  were similar prominent retroperitoneal lymph nodes without adenopathy by size criteria. There was no new or enlarging abdominal or pelvic lymph nodes. There were no new interval findings. There was similar circumferential wall thickening of a nondistended urinary bladder, which likely accentuated wall thickening There was hepatic steatosis and aortic atherosclerosis. ? 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  ?The lesion is 3.8 x 2.9 cm with SUV of 11.3. ?No evidence of metastatic peritoneal disease elsewhere in the abdomen pelvis.  No evidence of distant metastasis. ? ?Other medical problems ?Chronic lower extremity edema. ?12/24/2018, right lower extremity duplex negative for DVT.  Small right Baker's cyst. ?08/16/2019, bilateral lower extremity duplex showed no DVT. ? ?08/16/2019 - 08/17/2019 with right lower extremity cellulitis.  He was unable to bear weight.  He was treated with IV fluids, NSAIDs, colchicine, and broad antibiotics (vancomycin and Cefepime).  He was discharged on indomethacin x 5 days and Keflex 500 mg TID x 5 days. ? ?10/05/2020, colonoscopy showed internal hemorrhoids.  Otherwise normal examination. 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 ?Given that he has no other distant metastasis. Discussed with radiation oncology. Repeat SBRT may be considered if he does not respond well to systemic chemotherapy.   ? ?11/26/2020 patient was started on 5-FU and bevacizumab. ?12/14/2020 CEA 29.5 ?02/17/2021 CT abdomen pelvis showed partial response. Mild decrease of peritoneal lesion.  ?Proceed with 5-FU and bevacizumab today.  ?Given that he is tolerating current regimen with good life quality, partial response.  Shared decision was made to hold off adding additional chemotherapy agents. ? ?06/08/2021, CT chest abdomen pelvis with contrast showed soft tissue mass at the base of mesentery inferior to the pancreatic neck  is unchanged in size.  No progressive disease was identified. ? ?09/07/2021, CT chest abdomen pelvis with contrast showed no substantial changes in size of the mesenteric mass, 2.6 cm.  No suspicious new lesions.  Chronic findings as detailed in the imaging report. ? ? ?INTERVAL HISTORY ?Jared Gutierrez is a 81 y.o. male who has above history reviewed by me today presents for follow up visit for management of recurrent metastatic colon cancer ?Patient was accompanied by wife.   ?+ average 2 loose stools per day.  ?Otherwise no new complaints.  Denies nausea vomiting fever or chills. ? ?Review of Systems  ?Constitutional:  Negative for appetite change, chills, diaphoresis, fever and unexpected weight change.  ?HENT:   Negative for hearing loss, nosebleeds, sore throat and tinnitus.   ?Respiratory:  Negative for cough, hemoptysis and shortness of breath.   ?Cardiovascular:  Positive for leg swelling. Negative for chest pain and palpitations.  ?Gastrointestinal:  Negative for abdominal pain, blood in stool, constipation, diarrhea, nausea and vomiting.  ?Genitourinary:  Negative for dysuria, frequency and hematuria.   ?Musculoskeletal:  Positive for arthralgias. Negative for back pain, myalgias and neck pain.  ?Skin:  Negative for itching and rash.  ?Neurological:  Positive for numbness. Negative for dizziness and headaches.  ?Hematological:  Does not bruise/bleed easily.  ?Psychiatric/Behavioral:  Negative for depression. The patient is not nervous/anxious.   ? ? ? ?Past Medical History:  ?Diagnosis Date  ? Arthritis   ? OSTEOARTHRITIS  ? Cancer [facility])   ? Cavitary lesion of lung   ? RIGHT LOWER LOBE  ?  Chicken pox   ? Colon cancer ([place])   ? History of kidney stones   ? Hypertension   ? Lipoma of colon   ? Nephrolithiasis   ? Nephrolithiasis   ? Obesity   ? Shingles   ? Tubular adenoma of colon   ? multiple fragments  ? ? ?Past Surgical History:  ?Procedure Laterality Date  ? COLON SURGERY    ? COLONOSCOPY N/A 10/02/2014   ? Procedure: COLONOSCOPY;  Surgeon: Josefine Class, MD;  Location: [facility] ENDOSCOPY;  Service: Endoscopy;  Laterality: N/A;  ? COLONOSCOPY WITH PROPOFOL N/A 02/16/2017  ? Procedure: COLONOSCOPY WITH PROPO

## 2021-09-28 NOTE — Patient Instructions (Signed)
[facility] CANCER CTR AT [place]  Discharge Instructions: ?Thank you for choosing [facility] to provide your oncology and hematology care.  ?If you have a lab appointment with the [facility], please go directly to the [place] and check in at the registration area. ? ?Wear comfortable clothing and clothing appropriate for easy access to any Portacath or PICC line.  ? ?We strive to give you quality time with your provider. You may need to reschedule your appointment if you arrive late (15 or more minutes).  Arriving late affects you and other patients whose appointments are after yours.  Also, if you miss three or more appointments without notifying the office, you may be dismissed from the clinic at the provider?s discretion.    ?  ?For prescription refill requests, have your pharmacy contact our office and allow 72 hours for refills to be completed.   ? ?Today you received the following chemotherapy and/or immunotherapy agents Bevacizumab, Leucovorin, & 5FU    ?  ?To help prevent nausea and vomiting after your treatment, we encourage you to take your nausea medication as directed. ? ?BELOW ARE SYMPTOMS THAT SHOULD BE REPORTED IMMEDIATELY: ?*FEVER GREATER THAN 100.4 F (38 ?C) OR HIGHER ?*CHILLS OR SWEATING ?*NAUSEA AND VOMITING THAT IS NOT CONTROLLED WITH YOUR NAUSEA MEDICATION ?*UNUSUAL SHORTNESS OF BREATH ?*UNUSUAL BRUISING OR BLEEDING ?*URINARY PROBLEMS (pain or burning when urinating, or frequent urination) ?*BOWEL PROBLEMS (unusual diarrhea, constipation, pain near the anus) ?TENDERNESS IN MOUTH AND THROAT WITH OR WITHOUT PRESENCE OF ULCERS (sore throat, sores in mouth, or a toothache) ?UNUSUAL RASH, SWELLING OR PAIN  ?UNUSUAL VAGINAL DISCHARGE OR ITCHING  ? ?Items with * indicate a potential emergency and should be followed up as soon as possible or go to the Emergency Department if any problems should occur. ? ?Please show the CHEMOTHERAPY ALERT CARD or IMMUNOTHERAPY ALERT  CARD at check-in to the Emergency Department and triage nurse. ? ?Should you have questions after your visit or need to cancel or reschedule your appointment, please contact [facility] CANCER [place] AT [place]  [phone] and follow the prompts.  Office hours are 8:00 a.m. to 4:30 p.m. Monday - Friday. Please note that voicemails left after 4:00 p.m. may not be returned until the following business day.  We are closed weekends and major holidays. You have access to a nurse at all times for urgent questions. Please call the main number to the clinic [phone] and follow the prompts. ? ?For any non-urgent questions, you may also contact your provider using MyChart. We now offer e-Visits for anyone 42 and older to request care online for non-urgent symptoms. For details visit [email or web address]. ?  ?Also download the MyChart app! Go to the app store, search "MyChart", open the app, select [facility], and log in with your MyChart username and password. ? ?Due to Covid, a mask is required upon entering the hospital/clinic. If you do not have a mask, one will be given to you upon arrival. For doctor visits, patients may have 1 support person aged 26 or older with them. For treatment visits, patients cannot have anyone with them due to current Covid guidelines and our immunocompromised population.  ?

## 2021-09-30 ENCOUNTER — Inpatient Hospital Stay: Payer: Medicare Other

## 2021-09-30 DIAGNOSIS — Z5112 Encounter for antineoplastic immunotherapy: Secondary | ICD-10-CM | POA: Diagnosis not present

## 2021-09-30 DIAGNOSIS — Z85038 Personal history of other malignant neoplasm of large intestine: Secondary | ICD-10-CM

## 2021-09-30 DIAGNOSIS — C786 Secondary malignant neoplasm of retroperitoneum and peritoneum: Secondary | ICD-10-CM

## 2021-09-30 MED ORDER — HEPARIN SOD (PORK) LOCK FLUSH 100 UNIT/ML IV SOLN
500.0000 [IU] | Freq: Once | INTRAVENOUS | Status: AC | PRN
  Administered 2021-09-30: 500 [IU]
  Filled 2021-09-30: qty 5

## 2021-09-30 MED ORDER — SODIUM CHLORIDE 0.9% FLUSH
10.0000 mL | INTRAVENOUS | Status: DC | PRN
  Administered 2021-09-30: 10 mL
  Filled 2021-09-30: qty 10

## 2021-10-05 ENCOUNTER — Encounter: Payer: Self-pay | Admitting: Oncology

## 2021-10-11 MED FILL — Dexamethasone Sodium Phosphate Inj 100 MG/10ML: INTRAMUSCULAR | Qty: 1 | Status: AC

## 2021-10-12 ENCOUNTER — Inpatient Hospital Stay: Payer: Medicare Other

## 2021-10-12 ENCOUNTER — Inpatient Hospital Stay (HOSPITAL_BASED_OUTPATIENT_CLINIC_OR_DEPARTMENT_OTHER): Payer: Medicare Other | Admitting: Oncology

## 2021-10-12 ENCOUNTER — Encounter: Payer: Self-pay | Admitting: Oncology

## 2021-10-12 VITALS — BP 126/97 | HR 69 | Temp 97.0°F | Resp 18 | Wt 250.2 lb

## 2021-10-12 DIAGNOSIS — T451X5A Adverse effect of antineoplastic and immunosuppressive drugs, initial encounter: Secondary | ICD-10-CM

## 2021-10-12 DIAGNOSIS — C184 Malignant neoplasm of transverse colon: Secondary | ICD-10-CM

## 2021-10-12 DIAGNOSIS — C786 Secondary malignant neoplasm of retroperitoneum and peritoneum: Secondary | ICD-10-CM | POA: Diagnosis not present

## 2021-10-12 DIAGNOSIS — Z5111 Encounter for antineoplastic chemotherapy: Secondary | ICD-10-CM

## 2021-10-12 DIAGNOSIS — D6481 Anemia due to antineoplastic chemotherapy: Secondary | ICD-10-CM | POA: Diagnosis not present

## 2021-10-12 DIAGNOSIS — Z85038 Personal history of other malignant neoplasm of large intestine: Secondary | ICD-10-CM

## 2021-10-12 DIAGNOSIS — Z5112 Encounter for antineoplastic immunotherapy: Secondary | ICD-10-CM | POA: Diagnosis not present

## 2021-10-12 LAB — COMPREHENSIVE METABOLIC PANEL
ALT: 30 U/L (ref 0–44)
AST: 52 U/L — ABNORMAL HIGH (ref 15–41)
Albumin: 3.6 g/dL (ref 3.5–5.0)
Alkaline Phosphatase: 38 U/L (ref 38–126)
Anion gap: 6 (ref 5–15)
BUN: 12 mg/dL (ref 8–23)
CO2: 26 mmol/L (ref 22–32)
Calcium: 9.2 mg/dL (ref 8.9–10.3)
Chloride: 102 mmol/L (ref 98–111)
Creatinine, Ser: 0.86 mg/dL (ref 0.61–1.24)
GFR, Estimated: >60 mL/min (ref >60)
Glucose, Bld: 99 mg/dL (ref 70–99)
Potassium: 4.2 mmol/L (ref 3.5–5.1)
Sodium: 134 mmol/L — ABNORMAL LOW (ref 135–145)
Total Bilirubin: 1.3 mg/dL — ABNORMAL HIGH (ref 0.3–1.2)
Total Protein: 6.9 g/dL (ref 6.5–8.1)

## 2021-10-12 LAB — CBC WITH DIFFERENTIAL/PLATELET
Abs Immature Granulocytes: 0.02 10*3/uL (ref 0.00–0.07)
Basophils Absolute: 0.1 10*3/uL (ref 0.0–0.1)
Basophils Relative: 1 %
Eosinophils Absolute: 0.1 10*3/uL (ref 0.0–0.5)
Eosinophils Relative: 1 %
HCT: 38.3 % — ABNORMAL LOW (ref 39.0–52.0)
Hemoglobin: 12.6 g/dL — ABNORMAL LOW (ref 13.0–17.0)
Immature Granulocytes: 0 %
Lymphocytes Relative: 31 %
Lymphs Abs: 1.5 10*3/uL (ref 0.7–4.0)
MCH: 31.7 pg (ref 26.0–34.0)
MCHC: 32.9 g/dL (ref 30.0–36.0)
MCV: 96.2 fL (ref 80.0–100.0)
Monocytes Absolute: 0.5 10*3/uL (ref 0.1–1.0)
Monocytes Relative: 11 %
Neutro Abs: 2.6 10*3/uL (ref 1.7–7.7)
Neutrophils Relative %: 56 %
Platelets: 173 10*3/uL (ref 150–400)
RBC: 3.98 MIL/uL — ABNORMAL LOW (ref 4.22–5.81)
RDW: 14.1 % (ref 11.5–15.5)
WBC: 4.7 10*3/uL (ref 4.0–10.5)
nRBC: 0 % (ref 0.0–0.2)

## 2021-10-12 LAB — PROTEIN, URINE, RANDOM: Total Protein, Urine: 17 mg/dL

## 2021-10-12 MED ORDER — SODIUM CHLORIDE 0.9 % IV SOLN
5.0000 mg/kg | Freq: Once | INTRAVENOUS | Status: AC
  Administered 2021-10-12: 600 mg via INTRAVENOUS
  Filled 2021-10-12: qty 8

## 2021-10-12 MED ORDER — FLUOROURACIL CHEMO INJECTION 2.5 GM/50ML
400.0000 mg/m2 | Freq: Once | INTRAVENOUS | Status: AC
  Administered 2021-10-12: 950 mg via INTRAVENOUS
  Filled 2021-10-12: qty 19

## 2021-10-12 MED ORDER — SODIUM CHLORIDE 0.9% FLUSH
10.0000 mL | INTRAVENOUS | Status: DC | PRN
  Administered 2021-10-12: 10 mL via INTRAVENOUS
  Filled 2021-10-12: qty 10

## 2021-10-12 MED ORDER — SODIUM CHLORIDE 0.9 % IV SOLN
Freq: Once | INTRAVENOUS | Status: AC
  Filled 2021-10-12: qty 250

## 2021-10-12 MED ORDER — SODIUM CHLORIDE 0.9 % IV SOLN
401.0000 mg/m2 | Freq: Once | INTRAVENOUS | Status: AC
  Administered 2021-10-12: 950 mg via INTRAVENOUS
  Filled 2021-10-12: qty 47.5

## 2021-10-12 MED ORDER — SODIUM CHLORIDE 0.9 % IV SOLN
2400.0000 mg/m2 | INTRAVENOUS | Status: DC
  Administered 2021-10-12: 5700 mg via INTRAVENOUS
  Filled 2021-10-12: qty 114

## 2021-10-12 MED ORDER — SODIUM CHLORIDE 0.9 % IV SOLN
10.0000 mg | Freq: Once | INTRAVENOUS | Status: AC
  Administered 2021-10-12: 10 mg via INTRAVENOUS
  Filled 2021-10-12: qty 10

## 2021-10-12 NOTE — Progress Notes (Signed)
Pt here for follow up. No new concerns voiced.   

## 2021-10-12 NOTE — Patient Instructions (Signed)
[facility] CANCER CTR AT [place]  Discharge Instructions: Thank you for choosing [facility] to provide your oncology and hematology care.  If you have a lab appointment with the [facility], please go directly to the [facility] and check in at the registration area.  Wear comfortable clothing and clothing appropriate for easy access to any Portacath or PICC line.   We strive to give you quality time with your provider. You may need to reschedule your appointment if you arrive late (15 or more minutes).  Arriving late affects you and other patients whose appointments are after yours.  Also, if you miss three or more appointments without notifying the office, you may be dismissed from the clinic at the provider's discretion.      For prescription refill requests, have your pharmacy contact our office and allow 72 hours for refills to be completed.    Today you received the following chemotherapy and/or immunotherapy agents: Mvasi, Adrucil      To help prevent nausea and vomiting after your treatment, we encourage you to take your nausea medication as directed.  BELOW ARE SYMPTOMS THAT SHOULD BE REPORTED IMMEDIATELY: *FEVER GREATER THAN 100.4 F (38 C) OR HIGHER *CHILLS OR SWEATING *NAUSEA AND VOMITING THAT IS NOT CONTROLLED WITH YOUR NAUSEA MEDICATION *UNUSUAL SHORTNESS OF BREATH *UNUSUAL BRUISING OR BLEEDING *URINARY PROBLEMS (pain or burning when urinating, or frequent urination) *BOWEL PROBLEMS (unusual diarrhea, constipation, pain near the anus) TENDERNESS IN MOUTH AND THROAT WITH OR WITHOUT PRESENCE OF ULCERS (sore throat, sores in mouth, or a toothache) UNUSUAL RASH, SWELLING OR PAIN  UNUSUAL VAGINAL DISCHARGE OR ITCHING   Items with * indicate a potential emergency and should be followed up as soon as possible or go to the Emergency Department if any problems should occur.  Please show the CHEMOTHERAPY ALERT CARD or IMMUNOTHERAPY ALERT CARD at  check-in to the Emergency Department and triage nurse.  Should you have questions after your visit or need to cancel or reschedule your appointment, please contact [facility] CANCER [place] AT [place]  [phone] and follow the prompts.  Office hours are 8:00 a.m. to 4:30 p.m. Monday - Friday. Please note that voicemails left after 4:00 p.m. may not be returned until the following business day.  We are closed weekends and major holidays. You have access to a nurse at all times for urgent questions. Please call the main number to the clinic [phone] and follow the prompts.  For any non-urgent questions, you may also contact your provider using MyChart. We now offer e-Visits for anyone 98 and older to request care online for non-urgent symptoms. For details visit [email or web address].   Also download the MyChart app! Go to the app store, search "MyChart", open the app, select [facility], and log in with your MyChart username and password.  Due to Covid, a mask is required upon entering the hospital/clinic. If you do not have a mask, one will be given to you upon arrival. For doctor visits, patients may have 1 support person aged 80 or older with them. For treatment visits, patients cannot have anyone with them due to current Covid guidelines and our immunocompromised population.

## 2021-10-12 NOTE — Progress Notes (Signed)
Hematology/Oncology Progress note [phone] [phone]      Clinic Day: 10/12/2021   Referring physician: Tracie Harrier, MD  Chief Complaint: Jared Gutierrez is a 81 y.o. male presents for follow-up of metastatic colon cancer   PERTINENT ONCOLOGY HISTORY Jared Gutierrez is a 81 y.o.[place] who has above oncology history reviewed by me today presented for follow up visit for management of  Metastatic colon cancer. [name], patient switched care to me on 11/11/20 Extensive medical record review was performed by me  # Metastatic colon cancer.  he was diagnosed with stage I colon cancer s/p transverse colectomy on 10/17/2013.  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  Tumor extended into the submucosa. Margins were negative. + lymphovascular invasion. 14 lymph nodes were negative. Pathologic stage was T1 N0.   10/02/2014 Colonoscopy showed several 3 mm polyps and an 8 mm polyp in the cecum and transverse colon.  Pathology revealed tubular adenomas negative for high-grade dysplasia and malignancy.  02/16/2017 Colonoscopy  revealed 2 diminutive polyps in the descending colon.  Pathology revealed tubular adenomas without dysplasia or malignancy.  Her CEA has been monitored and was noted to increase starting July 2020. 04/02/2019  PET scan  limited evealed a 2.4 x 2.3 cm (SUV 11) hypermetabolic soft tissue density caudal and anterior to the pancreatic neck favored to represent isolated peritoneal or nodal metastasis in the setting of prior transverse colonic resection (expected primary drainage). Although this was immediately adjacent to the pancreas, a fat plane was maintained, arguing strongly against a pancreatic primary. Otherwise, there was no evidence of hypermetabolic metastasis.   04/17/2019 EUS on  revealed a normal  esophagus, stomach, duodenum, and pancreas.  There was a 2.4 x 2.4 cm irregular mass in the retroperitoneum adjacent to, but not involving the pancreatic neck.  FNA and core needle biopsy were performed.  Pathology revealed adenocarcinoma compatible with a metastatic lesion of colorectal origin.  Tumor cells were positive for CK20 and CDX2 and negative for CK7.   NGS: [employer] on 05/15/2019 revealed + KRASG13D and TP53.  Negative results included BRAF V600E, Her2, NRAS, NTRK, PD-L1 (<1%), and TMB 8.7/Mb (intermediate).  MMR testing from his colon resection on 10/16/2013 was intact with a low probability of MSI-H.  06/25/2019, CEA 17.8. 07/09/2019 - 08/27/2019; 09/17/2019 - 10/15/2019; 11/12/2019 - 12/17/2019 He received 11 cycles of FOLFOX chemotherapy and 1 cycle of 5-FU and leucovorin (12/31/2019).  He received Neulasta after cycle #4 and #5 secondary to progressive leukopenia.  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  He received a truncated course of FOLFOX with cycle #11 secondary to oxaliplatin reaction.  08/27/2019, CEA 6.1 10/29/2019, CEA 7.7 12/31/2019, CEA 10.8.  01/14/2020 PET revealed an interval decrease in size and FDG uptake (2.4 x 2.3 cm with SUV 11 to 2.4 x 1.7 cm with SUV 4.09) associated with the previously referenced soft tissue density caudal and anterior to the pancreatic neck suggesting treatment response. There were no new sites of FDG avid tumor.  Radiation treatment 02/10/2020 - 02/23/2020.   He received a hybrid 3-dimensional course of 3000 cGy in 10 fractions to the residual FDG avid mass   05/27/2020, CEA 11.6 05/27/2020 Abdomen and pelvis CT :stable to minimally increased (2.9 x 1.8 cm compared to 2.4 x 1.7 cm) since 01/14/2020.  There were no new interval findings.    08/31/2020, CEA 30.9. 08/31/2020 Abdomen  and pelvis CT revealed increased size of the index soft tissue lesion inferior and anterior to the pancreatic neck (2.9 x 1.8 cm to 3.5 x 2.9 cm). There  were similar prominent retroperitoneal lymph nodes without adenopathy by size criteria. There was no new or enlarging abdominal or pelvic lymph nodes. There were no new interval findings. There was similar circumferential wall thickening of a nondistended urinary bladder, which likely accentuated wall thickening There was hepatic steatosis and aortic atherosclerosis.  11/03/2020, PET showed recurrent peritoneal metastasis in the upper abdomen adjacent to the pancreas.  The lesion is 3.8 x 2.9 cm with SUV of 11.3. No evidence of metastatic peritoneal disease elsewhere in the abdomen pelvis.  No evidence of distant metastasis.  Other medical problems Chronic lower extremity edema. 12/24/2018, right lower extremity duplex negative for DVT.  Small right Baker's cyst. 08/16/2019, bilateral lower extremity duplex showed no DVT.  08/16/2019 - 08/17/2019 with right lower extremity cellulitis.  He was unable to bear weight.  He was treated with IV fluids, NSAIDs, colchicine, and broad antibiotics (vancomycin and Cefepime).  He was discharged on indomethacin x 5 days and Keflex 500 mg TID x 5 days.  10/05/2020, colonoscopy showed internal hemorrhoids.  Otherwise normal examination. 11/03/2020 PET scan showed recurrent peritoneal metastasis near pancreas. Given that he has no other distant metastasis. Discussed with radiation oncology. Repeat SBRT may be considered if he does not respond well to systemic chemotherapy.    11/26/2020 patient was started on 5-FU and bevacizumab. 12/14/2020 CEA 29.5 02/17/2021 CT abdomen pelvis showed partial response. Mild decrease of peritoneal lesion.  Proceed with 5-FU and bevacizumab today.  Given that he is tolerating current regimen with good life quality, partial response.  Shared decision was made to hold off adding additional chemotherapy agents.  06/08/2021, CT chest abdomen pelvis with contrast showed soft tissue mass at the base of mesentery inferior to the pancreatic neck  is unchanged in size.  No progressive disease was identified.  09/07/2021, CT chest abdomen pelvis with contrast showed no substantial changes in size of the mesenteric mass, 2.6 cm.  No suspicious new lesions.  Chronic findings as detailed in the imaging report.   INTERVAL HISTORY Jared Gutierrez is a 81 y.o. male who has above history reviewed by me today presents for follow up visit for management of recurrent metastatic colon cancer Patient was accompanied by wife.   Denies any nausea vomiting diarrhea.  No new complaints.   Review of Systems  Constitutional:  Negative for appetite change, chills, diaphoresis, fever and unexpected weight change.  HENT:   Negative for hearing loss, nosebleeds, sore throat and tinnitus.   Respiratory:  Negative for cough, hemoptysis and shortness of breath.   Cardiovascular:  Negative for chest pain and palpitations.  Gastrointestinal:  Negative for abdominal pain, blood in stool, constipation, diarrhea, nausea and vomiting.  Genitourinary:  Negative for dysuria, frequency and hematuria.   Musculoskeletal:  Positive for arthralgias. Negative for back pain, myalgias and neck pain.  Skin:  Negative for itching and rash.  Neurological:  Positive for numbness. Negative for dizziness and headaches.  Hematological:  Does not bruise/bleed easily.  Psychiatric/Behavioral:  Negative for depression. The patient is not nervous/anxious.      Past Medical History:  Diagnosis Date   Arthritis    OSTEOARTHRITIS   Cancer ([place])    Cavitary lesion of lung    RIGHT LOWER LOBE   Chicken pox    Colon cancer ([place])    History  of kidney stones    Hypertension    Lipoma of colon    Nephrolithiasis    Nephrolithiasis    Obesity    Shingles    Tubular adenoma of colon    multiple fragments    Past Surgical History:  Procedure Laterality Date   COLON SURGERY     COLONOSCOPY N/A 10/02/2014   Procedure: COLONOSCOPY;  Surgeon: Josefine Class, MD;  Location:  [facility] ENDOSCOPY;  Service: Endoscopy;  Laterality: N/A;   COLONOSCOPY WITH PROPOFOL N/A 02/16/2017   Procedure: COLONOSCOPY WITH PROPOFOL;  Surgeon: Manya Silvas, MD;  Location: [facility] ENDOSCOPY;  Service: Endoscopy;  Laterality: N/A;   COLONOSCOPY WITH PROPOFOL N/A 10/05/2020   Procedure: COLONOSCOPY WITH PROPOFOL;  Surgeon: Lesly Rubenstein, MD;  Location: [facility] ENDOSCOPY;  Service: Endoscopy;  Laterality: N/A;   EUS N/A 04/17/2019   Procedure: FULL UPPER ENDOSCOPIC ULTRASOUND (EUS) RADIAL;  Surgeon: Holly Bodily, MD;  Location: [facility] ENDOSCOPY;  Service: Gastroenterology;  Laterality: N/A;   KIDNEY STONE SURGERY     PARTIAL COLECTOMY  10/17/2013   PORTACATH PLACEMENT Right 06/13/2019   Procedure: INSERTION PORT-A-CATH;  Surgeon: Benjamine Sprague, DO;  Location: [facility] ORS;  Service: General;  Laterality: Right;    Family History  Problem Relation Age of Onset   Cancer Mother    Breast cancer Mother    COPD Father     Social History:  reports that he has never smoked. He has never used smokeless tobacco. He reports current alcohol use. He reports that he does not use drugs.    Allergies: No Known Allergies  Current Medications: Current Outpatient Medications  Medication Sig Dispense Refill   acetaminophen (TYLENOL) 500 MG tablet Take 500 mg by mouth every 6 (six) hours as needed.     amLODipine (NORVASC) 2.5 MG tablet Take 2.5 mg by mouth daily.     aspirin EC 81 MG tablet Take 81 mg by mouth daily.     Calcium Carbonate (CALCIUM 600 PO) Take 1 tablet by mouth 2 (two) times daily.     Cholecalciferol (VITAMIN D) 125 MCG (5000 UT) CAPS Take 5,000 mg by mouth.     gabapentin (NEURONTIN) 100 MG capsule Take 1 capsule (100 mg total) by mouth 3 (three) times daily. 90 capsule 0   HYDROcodone-acetaminophen (NORCO/VICODIN) 5-325 MG tablet Take 1 tablet by mouth every 6 (six) hours as needed for moderate pain (up to 3 doses for moderate pain.).     KLOR-CON M20 20 MEQ tablet TAKE 1  TABLET BY MOUTH TWICE A DAY 60 tablet 0   lidocaine-prilocaine (EMLA) cream Apply to affected area once 30 g 3   loperamide (IMODIUM) 2 MG capsule Take 1 capsule (2 mg total) by mouth See admin instructions. Take 2 tablets after first loose stool,  then 1 tablet  after each loose stool; maximum: 8 tablets /day 60 capsule 0   loratadine (CLARITIN) 10 MG tablet Take 10 mg by mouth daily.     ondansetron (ZOFRAN) 8 MG tablet Take 1 tablet (8 mg total) by mouth 2 (two) times daily as needed for refractory nausea / vomiting. Start on day 3 after chemotherapy. 60 tablet 1   Probiotic Product (PROBIOTIC DAILY PO) Take 1 tablet by mouth daily.     prochlorperazine (COMPAZINE) 10 MG tablet Take 1 tablet (10 mg total) by mouth every 6 (six) hours as needed (NAUSEA). 30 tablet 1   No current facility-administered medications for this visit.   Facility-Administered Medications Ordered  in Other Visits  Medication Dose Route Frequency Provider Last Rate Last Admin   0.9 %  sodium chloride infusion   Intravenous Once Corcoran, Melissa C, MD       0.9 %  sodium chloride infusion   Intravenous Continuous Nolon Stalls C, MD 10 mL/hr at 12/31/19 1000 New Bag at 10/05/20 1045   heparin lock flush 100 unit/mL  500 Units Intravenous Once Lequita Asal, MD        Performance status (ECOG): 1  Vitals Blood pressure (!) 126/97, pulse 69, temperature (!) 97 F (36.1 C), resp. rate 18, weight 250 lb 3.2 oz (113.5 kg).   Physical Exam Vitals and nursing note reviewed.  Constitutional:      General: He is not in acute distress.    Appearance: He is well-developed. He is obese. He is not diaphoretic.     Interventions: Face mask in place.  HENT:     Head: Normocephalic and atraumatic.     Mouth/Throat:     Mouth: Mucous membranes are moist.     Pharynx: Oropharynx is clear.  Eyes:     General: No scleral icterus.    Extraocular Movements: Extraocular movements intact.     Conjunctiva/sclera:  Conjunctivae normal.     Pupils: Pupils are equal, round, and reactive to light.  Cardiovascular:     Rate and Rhythm: Normal rate and regular rhythm.     Heart sounds: Normal heart sounds. No murmur heard. Pulmonary:     Effort: Pulmonary effort is normal. No respiratory distress.     Breath sounds: No wheezing or rales.     Comments: Decreased breath sound bilaterally Chest:     Chest wall: No tenderness.  Abdominal:     General: Bowel sounds are normal. There is no distension.     Palpations: Abdomen is soft. There is no mass.     Tenderness: There is no abdominal tenderness. There is no guarding or rebound.  Musculoskeletal:        General: No swelling or tenderness. Normal range of motion.     Cervical back: Normal range of motion and neck supple.     Right lower leg: Edema present.     Left lower leg: Edema present.  Lymphadenopathy:     Head:     Right side of head: No preauricular, posterior auricular or occipital adenopathy.     Left side of head: No preauricular, posterior auricular or occipital adenopathy.     Cervical: No cervical adenopathy.     Upper Body:     Right upper body: No supraclavicular or axillary adenopathy.     Left upper body: No supraclavicular or axillary adenopathy.     Lower Body: No right inguinal adenopathy. No left inguinal adenopathy.  Skin:    General: Skin is warm and dry.  Neurological:     General: No focal deficit present.     Mental Status: He is alert and oriented to person, place, and time. Mental status is at baseline.  Psychiatric:        Mood and Affect: Mood normal.   Infusion on 10/12/2021  Component Date Value Ref Range Status   Total Protein, Urine 10/12/2021 17  mg/dL Final   Comment: NO NORMAL RANGE ESTABLISHED FOR THIS TEST Performed at [facility], [address] [place], [place] [address]    Sodium 10/12/2021 134 (L)  135 - 145 mmol/L Final   Potassium 10/12/2021 4.2  3.5 - 5.1 mmol/L Final  Chloride  10/12/2021 102  98 - 111 mmol/L Final   CO2 10/12/2021 26  22 - 32 mmol/L Final   Glucose, Bld 10/12/2021 99  70 - 99 mg/dL Final   Glucose reference range applies only to samples taken after fasting for at least 8 hours.   BUN 10/12/2021 12  8 - 23 mg/dL Final   Creatinine, Ser 10/12/2021 0.86  0.61 - 1.24 mg/dL Final   Calcium 10/12/2021 9.2  8.9 - 10.3 mg/dL Final   Total Protein 10/12/2021 6.9  6.5 - 8.1 g/dL Final   Albumin 10/12/2021 3.6  3.5 - 5.0 g/dL Final   AST 10/12/2021 52 (H)  15 - 41 U/L Final   ALT 10/12/2021 30  0 - 44 U/L Final   Alkaline Phosphatase 10/12/2021 38  38 - 126 U/L Final   Total Bilirubin 10/12/2021 1.3 (H)  0.3 - 1.2 mg/dL Final   GFR, Estimated 10/12/2021 >60  >60 mL/min Final   Comment: (NOTE) Calculated using the CKD-EPI Creatinine Equation (2021)    Anion gap 10/12/2021 6  5 - 15 Final   Performed at [facility], [place], [place] [address]   WBC 10/12/2021 4.7  4.0 - 10.5 K/uL Final   RBC 10/12/2021 3.98 (L)  4.22 - 5.81 MIL/uL Final   Hemoglobin 10/12/2021 12.6 (L)  13.0 - 17.0 g/dL Final   HCT 10/12/2021 38.3 (L)  39.0 - 52.0 % Final   MCV 10/12/2021 96.2  80.0 - 100.0 fL Final   MCH 10/12/2021 31.7  26.0 - 34.0 pg Final   MCHC 10/12/2021 32.9  30.0 - 36.0 g/dL Final   RDW 10/12/2021 14.1  11.5 - 15.5 % Final   Platelets 10/12/2021 173  150 - 400 K/uL Final   nRBC 10/12/2021 0.0  0.0 - 0.2 % Final   Neutrophils Relative % 10/12/2021 56  % Final   Neutro Abs 10/12/2021 2.6  1.7 - 7.7 K/uL Final   Lymphocytes Relative 10/12/2021 31  % Final   Lymphs Abs 10/12/2021 1.5  0.7 - 4.0 K/uL Final   Monocytes Relative 10/12/2021 11  % Final   Monocytes Absolute 10/12/2021 0.5  0.1 - 1.0 K/uL Final   Eosinophils Relative 10/12/2021 1  % Final   Eosinophils Absolute 10/12/2021 0.1  0.0 - 0.5 K/uL Final   Basophils Relative 10/12/2021 1  % Final   Basophils Absolute 10/12/2021 0.1  0.0 - 0.1 K/uL Final   Immature Granulocytes  10/12/2021 0  % Final   Abs Immature Granulocytes 10/12/2021 0.02  0.00 - 0.07 K/uL Final   Performed at [facility], [address] [place], [place] [address]    Assessment and plan 1. Encounter for antineoplastic chemotherapy   2. 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 Anemia due to chemotherapy    #Metastatic colon cancer-recurrent Overall he tolerates chemotherapy.   CEA is relatively stable with small fluctuations. Labs reviewed and discussed with patient Proceed with 5-FU/bevacizumab treatment today Plan to repeat PET scan when he is due for next imaging for further evaluation of activity of the [facility] 2023   #Chemotherapy-induced diarrhea, stop stool softener.  Stable symptoms.  Continue Imodium as needed.  #History of chemotherapy induced neuropathy, not on gabapentin due to patient's preference.  Patient has gabapentin supply at home.Patient declines gabapentin and acupuncture.   #Anemia due to chemotherapy, continue to monitor.  Hemoglobin is close to baseline.   Follow-up  2 weeks lab MD 5-FU/bevacizumab -   I discussed the assessment and treatment plan with the patient.  The patient was provided an opportunity to ask questions and all were answered.  The patient agreed with the plan and demonstrated an understanding of the instructions.  The patient was advised to call back if the symptoms worsen or if the condition fails to improve as anticipated.  [name], MD, PhD 10/12/2021

## 2021-10-14 ENCOUNTER — Inpatient Hospital Stay: Payer: Medicare Other | Attending: Oncology

## 2021-10-14 VITALS — BP 168/73 | HR 58 | Resp 18

## 2021-10-14 DIAGNOSIS — Z809 Family history of malignant neoplasm, unspecified: Secondary | ICD-10-CM | POA: Insufficient documentation

## 2021-10-14 DIAGNOSIS — C184 Malignant neoplasm of transverse colon: Secondary | ICD-10-CM | POA: Insufficient documentation

## 2021-10-14 DIAGNOSIS — C786 Secondary malignant neoplasm of retroperitoneum and peritoneum: Secondary | ICD-10-CM | POA: Diagnosis not present

## 2021-10-14 DIAGNOSIS — G620 Drug-induced polyneuropathy: Secondary | ICD-10-CM | POA: Insufficient documentation

## 2021-10-14 DIAGNOSIS — Z85038 Personal history of other malignant neoplasm of large intestine: Secondary | ICD-10-CM

## 2021-10-14 DIAGNOSIS — Z5112 Encounter for antineoplastic immunotherapy: Secondary | ICD-10-CM | POA: Insufficient documentation

## 2021-10-14 DIAGNOSIS — Z79899 Other long term (current) drug therapy: Secondary | ICD-10-CM | POA: Insufficient documentation

## 2021-10-14 DIAGNOSIS — D6481 Anemia due to antineoplastic chemotherapy: Secondary | ICD-10-CM | POA: Diagnosis not present

## 2021-10-14 DIAGNOSIS — Z86010 Personal history of colonic polyps: Secondary | ICD-10-CM | POA: Diagnosis not present

## 2021-10-14 DIAGNOSIS — E669 Obesity, unspecified: Secondary | ICD-10-CM | POA: Insufficient documentation

## 2021-10-14 DIAGNOSIS — K5900 Constipation, unspecified: Secondary | ICD-10-CM | POA: Insufficient documentation

## 2021-10-14 DIAGNOSIS — Z5111 Encounter for antineoplastic chemotherapy: Secondary | ICD-10-CM | POA: Insufficient documentation

## 2021-10-14 DIAGNOSIS — Z836 Family history of other diseases of the respiratory system: Secondary | ICD-10-CM | POA: Diagnosis not present

## 2021-10-14 DIAGNOSIS — T451X5D Adverse effect of antineoplastic and immunosuppressive drugs, subsequent encounter: Secondary | ICD-10-CM | POA: Insufficient documentation

## 2021-10-14 DIAGNOSIS — Z803 Family history of malignant neoplasm of breast: Secondary | ICD-10-CM | POA: Diagnosis not present

## 2021-10-14 MED ORDER — SODIUM CHLORIDE 0.9% FLUSH
10.0000 mL | INTRAVENOUS | Status: DC | PRN
  Administered 2021-10-14: 10 mL
  Filled 2021-10-14: qty 10

## 2021-10-14 MED ORDER — HEPARIN SOD (PORK) LOCK FLUSH 100 UNIT/ML IV SOLN
500.0000 [IU] | Freq: Once | INTRAVENOUS | Status: AC | PRN
  Administered 2021-10-14: 500 [IU]
  Filled 2021-10-14: qty 5

## 2021-10-20 ENCOUNTER — Other Ambulatory Visit: Payer: Self-pay | Admitting: Oncology

## 2021-10-20 NOTE — Telephone Encounter (Signed)
Component Ref Range & Units 8 d ago (10/12/21) 3 wk ago (09/28/21) 1 mo ago (09/14/21) 1 mo ago (08/31/21) 2 mo ago (08/17/21) 2 mo ago (08/03/21) 3 mo ago (07/20/21)  Potassium 3.5 - 5.1 mmol/L 4.2  3.5  3.7  3.6  3.8  3.5  4.0

## 2021-10-26 ENCOUNTER — Inpatient Hospital Stay: Payer: Medicare Other

## 2021-10-26 ENCOUNTER — Encounter: Payer: Self-pay | Admitting: Oncology

## 2021-10-26 ENCOUNTER — Inpatient Hospital Stay (HOSPITAL_BASED_OUTPATIENT_CLINIC_OR_DEPARTMENT_OTHER): Payer: Medicare Other | Admitting: Oncology

## 2021-10-26 DIAGNOSIS — C786 Secondary malignant neoplasm of retroperitoneum and peritoneum: Secondary | ICD-10-CM

## 2021-10-26 DIAGNOSIS — G620 Drug-induced polyneuropathy: Secondary | ICD-10-CM

## 2021-10-26 DIAGNOSIS — Z5112 Encounter for antineoplastic immunotherapy: Secondary | ICD-10-CM | POA: Diagnosis not present

## 2021-10-26 DIAGNOSIS — D6481 Anemia due to antineoplastic chemotherapy: Secondary | ICD-10-CM | POA: Diagnosis not present

## 2021-10-26 DIAGNOSIS — Z85038 Personal history of other malignant neoplasm of large intestine: Secondary | ICD-10-CM

## 2021-10-26 DIAGNOSIS — K5909 Other constipation: Secondary | ICD-10-CM

## 2021-10-26 DIAGNOSIS — K5900 Constipation, unspecified: Secondary | ICD-10-CM | POA: Insufficient documentation

## 2021-10-26 DIAGNOSIS — C184 Malignant neoplasm of transverse colon: Secondary | ICD-10-CM

## 2021-10-26 DIAGNOSIS — T451X5A Adverse effect of antineoplastic and immunosuppressive drugs, initial encounter: Secondary | ICD-10-CM

## 2021-10-26 DIAGNOSIS — Z5111 Encounter for antineoplastic chemotherapy: Secondary | ICD-10-CM

## 2021-10-26 LAB — CBC WITH DIFFERENTIAL/PLATELET
Abs Immature Granulocytes: 0.02 10*3/uL (ref 0.00–0.07)
Basophils Absolute: 0 10*3/uL (ref 0.0–0.1)
Basophils Relative: 1 %
Eosinophils Absolute: 0.1 10*3/uL (ref 0.0–0.5)
Eosinophils Relative: 2 %
HCT: 38.2 % — ABNORMAL LOW (ref 39.0–52.0)
Hemoglobin: 12.7 g/dL — ABNORMAL LOW (ref 13.0–17.0)
Immature Granulocytes: 0 %
Lymphocytes Relative: 32 %
Lymphs Abs: 1.4 10*3/uL (ref 0.7–4.0)
MCH: 31.9 pg (ref 26.0–34.0)
MCHC: 33.2 g/dL (ref 30.0–36.0)
MCV: 96 fL (ref 80.0–100.0)
Monocytes Absolute: 0.4 10*3/uL (ref 0.1–1.0)
Monocytes Relative: 9 %
Neutro Abs: 2.5 10*3/uL (ref 1.7–7.7)
Neutrophils Relative %: 56 %
Platelets: 167 10*3/uL (ref 150–400)
RBC: 3.98 MIL/uL — ABNORMAL LOW (ref 4.22–5.81)
RDW: 14 % (ref 11.5–15.5)
WBC: 4.5 10*3/uL (ref 4.0–10.5)
nRBC: 0 % (ref 0.0–0.2)

## 2021-10-26 LAB — COMPREHENSIVE METABOLIC PANEL
ALT: 28 U/L (ref 0–44)
AST: 45 U/L — ABNORMAL HIGH (ref 15–41)
Albumin: 3.7 g/dL (ref 3.5–5.0)
Alkaline Phosphatase: 40 U/L (ref 38–126)
Anion gap: 9 (ref 5–15)
BUN: 14 mg/dL (ref 8–23)
CO2: 24 mmol/L (ref 22–32)
Calcium: 9.5 mg/dL (ref 8.9–10.3)
Chloride: 103 mmol/L (ref 98–111)
Creatinine, Ser: 0.88 mg/dL (ref 0.61–1.24)
GFR, Estimated: >60 mL/min (ref >60)
Glucose, Bld: 164 mg/dL — ABNORMAL HIGH (ref 70–99)
Potassium: 3.9 mmol/L (ref 3.5–5.1)
Sodium: 136 mmol/L (ref 135–145)
Total Bilirubin: 1.1 mg/dL (ref 0.3–1.2)
Total Protein: 6.6 g/dL (ref 6.5–8.1)

## 2021-10-26 LAB — PROTEIN, URINE, RANDOM: Total Protein, Urine: 18 mg/dL

## 2021-10-26 MED ORDER — SODIUM CHLORIDE 0.9 % IV SOLN
5.0000 mg/kg | Freq: Once | INTRAVENOUS | Status: AC
  Administered 2021-10-26: 600 mg via INTRAVENOUS
  Filled 2021-10-26: qty 16

## 2021-10-26 MED ORDER — SODIUM CHLORIDE 0.9 % IV SOLN
Freq: Once | INTRAVENOUS | Status: AC
  Filled 2021-10-26: qty 250

## 2021-10-26 MED ORDER — SODIUM CHLORIDE 0.9 % IV SOLN
10.0000 mg | Freq: Once | INTRAVENOUS | Status: AC
  Administered 2021-10-26: 10 mg via INTRAVENOUS
  Filled 2021-10-26: qty 10

## 2021-10-26 MED ORDER — SODIUM CHLORIDE 0.9 % IV SOLN
401.0000 mg/m2 | Freq: Once | INTRAVENOUS | Status: AC
  Administered 2021-10-26: 950 mg via INTRAVENOUS
  Filled 2021-10-26: qty 47.5

## 2021-10-26 MED ORDER — SODIUM CHLORIDE 0.9 % IV SOLN
2400.0000 mg/m2 | INTRAVENOUS | Status: DC
  Administered 2021-10-26: 5700 mg via INTRAVENOUS
  Filled 2021-10-26: qty 114

## 2021-10-26 MED ORDER — DOCUSATE SODIUM 100 MG PO CAPS: 100.0000 mg | ORAL_CAPSULE | Freq: Two times a day (BID) | ORAL | 3 refills | Status: DC | PRN

## 2021-10-26 MED ORDER — FLUOROURACIL CHEMO INJECTION 2.5 GM/50ML
400.0000 mg/m2 | Freq: Once | INTRAVENOUS | Status: AC
  Administered 2021-10-26: 950 mg via INTRAVENOUS
  Filled 2021-10-26: qty 19

## 2021-10-26 NOTE — Progress Notes (Signed)
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   CEA is relatively stable with small fluctuations. 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   Chemotherapy-induced neuropathy ([place])  He gabapentin due to patient's preference.  Patient has gabapentin supply at home.Patient declines gabapentin and acupuncture.   Anemia due to chemotherapy continue to monitor.  Hemoglobin is close to baseline.  Constipation Colace 130m 1-2 times daily.  No orders of the defined types were placed in this encounter.  Follow-up 2 weeks lab MD 5-FU/bevacizumab -   All questions were answered. The patient knows to call the clinic with any problems, questions or concerns.  ZEarlie Server MD, PhD [facility] 10/26/2021   Chief Complaint: Jared Gutierrez a 81y.o. male presents for follow-up of metastatic colon cancer   PERTINENT ONCOLOGY HISTORY WJEHIEL KOEPPis a 81y.o.[place] who has above oncology history reviewed by me today presented for follow up visit for management of Metastatic colon cancer. [name], patient switched care to me on 11/11/20 Extensive medical record review was performed by me  Oncology History  History of colon cancer, stage I  09/23/2013 Pathology Results   Part A: TRANSVERSE COLON POLYP HOT SNARE:  - TUBULOVILLOUS ADENOMA, SEE COMMENT.  - NEGATIVE FOR HIGH GRADE DYSPLASIA AND MALIGNANCY.  .  Part B: SPLENIC FLEXURE MASS COLD BIOPSY:  - SUPERFICIAL SAMPLING OF A TUBULOVILLOUS ADENOMA WITH AT LEAST  HIGH  GRADE DYSPLASIA, SEE COMMENT.    09/23/2013 Initial Diagnosis   His initial cancer was discovered through screening colonoscopy   10/17/2013 Pathology Results   TRANSVERSE COLON, RESECTION:  - INVASIVE ADENOCARCINOMA ARISING IN A 4.6 CM TUBULOVILLOUS  ADENOMA WITH HIGH GRADE DYSPLASIA (MALIGNANT POLYP).  - SEE SUMMARY BELOW.  .[name] ONCOLOGY SUMMARY: COLON AND RECTUM, RESECTION, AJCC 7TH EDITION  Specimen: transverse colon  Procedure: transverse colon resection  Tumor site: splenic flexure  Tumor size: 1.0 cm (invasive component)  Macroscopic tumor perforation: not specified  Histologic type: invasive adenocarcinoma  Histologic grade: moderately differentiated  Microscopic tumor extension: into submucosa  Margins:       Proximal margin: negative       Distal margin: negative       Circumferential (radial) or mesenteric margin: negative       If all margins uninvolved by invasive carcinoma:       Distance of invasive carcinoma from closest margin: 7.5 cm  to distal margin  Treatment effect: not applicable  Lymph-vascular invasion: present  Perineural invasion: not identified  Tumor deposits (discontinuous extramural extension): not  identified  Pathologic staging:       Primary tumor: pT1       Regional lymph nodes: pN0            Number of nodes examined: 14            Number of nodes involved: 0    10/17/2013 Surgery   He underwent transverse colectomy   10/02/2014 Procedure   He has surveillance colonoscopy. 4 colon polyps were removed   10/02/2014 Pathology Results   [id number]  colonoscopy pathology A. CECAL POLYPS; HOT SNARE AND COLD BIOPSY:  - TUBULAR ADENOMA, MULTIPLE FRAGMENTS.  - NEGATIVE FOR HIGH-GRADE DYSPLASIA AND MALIGNANCY.   B. COLON POLYP, TRANSVERSE; COLD BIOPSY:  - TUBULAR ADENOMA.  - NEGATIVE FOR HIGH-GRADE DYSPLASIA AND MALIGNANCY.     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  Tumor extended into the submucosa. Margins were negative. + lymphovascular invasion. 14 lymph nodes were negative. Pathologic stage was T1 N0.     04/02/2019 Progression   04/02/2019  PET scan  limited evealed a 2.4 x 2.3 cm (SUV 11) hypermetabolic soft tissue density caudal and anterior to the pancreatic neck favored to represent isolated peritoneal or nodal metastasis in the setting of prior transverse colonic resection (expected primary drainage). Although this was immediately adjacent to the pancreas, a fat plane was maintained, arguing strongly against a pancreatic primary. Otherwise, there was no evidence of hypermetabolic metastasis. 04/17/2019 EUS on  revealed a normal esophagus, stomach, duodenum, and pancreas.  There was a 2.4 x 2.4 cm irregular mass in the retroperitoneum adjacent to, but not involving the pancreatic neck.  FNA and core needle biopsy were performed.  Pathology revealed adenocarcinoma compatible with a metastatic lesion of colorectal origin.  Tumor cells were positive for CK20 and CDX2 and negative for CK7.  NGS: [employer] on 05/15/2019 revealed + KRASG13D and TP53.  Negative results included BRAF V600E, Her2, NRAS, NTRK, PD-L1 (<1%), and TMB 8.7/Mb (intermediate).  MMR testing from his colon resection on 10/16/2013 was intact with a low probability of MSI-H.   07/09/2019 -  Chemotherapy   He received 11 cycles of FOLFOX chemotherapy  and 1 cycle of 5-FU and leucovorin (12/31/2019).  He received Neulasta after cycle #4 and #5 secondary to progressive leukopenia.  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  He received a truncated course of FOLFOX with cycle #11 secondary to oxaliplatin reaction.   01/14/2020 Imaging    PET revealed an interval decrease in size and FDG uptake (2.4 x 2.3 cm with SUV 11 to 2.4 x 1.7 cm with SUV 4.09) associated with the previously referenced soft tissue density caudal and anterior to the pancreatic neck suggesting treatment response. There were no new sites of FDG avid tumor.   08/31/2020 Imaging   08/31/2020 Abdomen and pelvis CT revealed increased size of the index soft tissue lesion inferior and anterior to the pancreatic neck (2.9 x 1.8 cm to 3.5 x 2.9 cm). There were similar prominent retroperitoneal lymph nodes without adenopathy by size criteria. There was no new or enlarging abdominal or pelvic lymph nodes. There were no new interval findings. There was similar circumferential wall thickening of a nondistended urinary bladder, which likely accentuated wall thickening There was hepatic steatosis and aortic atherosclerosis.   11/03/2020, PET showed recurrent peritoneal metastasis in the upper abdomen adjacent to  the pancreas.  The lesion is 3.8 x 2.9 cm with SUV of 11.3. No evidence of metastatic peritoneal disease elsewhere in the abdomen pelvis.  No evidence of distant metastasis.   11/26/2020 -  Chemotherapy   5-FU and bevacizumab.   10/26/2021 Cancer Staging   Staging form: Colon and Rectum, [name], [name], cM1) - Signed by [name], MD on 10/26/2021 Stage prefix: Recurrence Total positive nodes: 0   02/17/2022 Imaging   02/17/2021 CT abdomen pelvis showed partial response. Mild decrease of peritoneal lesion.  Proceed with 5-FU and bevacizumab today.  Given that he is tolerating current regimen with good life quality, partial response.  Shared  decision was made to hold off adding additional chemotherapy agents.   06/08/2021, CT chest abdomen pelvis with contrast showed soft tissue mass at the base of mesentery inferior to the pancreatic neck is unchanged in size.  No progressive disease was identified.   09/07/2021, CT chest abdomen pelvis with contrast showed no substantial changes in size of the mesenteric mass, 2.6 cm.  No suspicious new lesions.  Chronic findings as detailed in the imaging report.     Other medical problems Chronic lower extremity edema. 12/24/2018, right lower extremity duplex negative for DVT.  Small right Baker's cyst. 08/16/2019, bilateral lower extremity duplex showed no DVT.  08/16/2019 - 08/17/2019 with right lower extremity cellulitis.  He was unable to bear weight.  He was treated with IV fluids, NSAIDs, colchicine, and broad antibiotics (vancomycin and Cefepime).  He was discharged on indomethacin x 5 days and Keflex 500 mg TID x 5 days.  10/05/2020, colonoscopy showed internal hemorrhoids.  Otherwise normal examination. 11/03/2020 PET scan showed recurrent peritoneal metastasis near pancreas. Given that he has no other distant metastasis. Discussed with radiation oncology. Repeat SBRT may be considered if he does not respond well to systemic chemotherapy.    11/26/2020 patient was started on 5-FU and bevacizumab. 12/14/2020 CEA 29.5 02/17/2021 CT abdomen pelvis showed partial response. Mild decrease of peritoneal lesion.  Proceed with 5-FU and bevacizumab today.  Given that he is tolerating current regimen with good life quality, partial response.  Shared decision was made to hold off adding additional chemotherapy agents.  06/08/2021, CT chest abdomen pelvis with contrast showed soft tissue mass at the base of mesentery inferior to the pancreatic neck is unchanged in size.  No progressive disease was identified.  09/07/2021, CT chest abdomen pelvis with contrast showed no substantial changes in size of the  mesenteric mass, 2.6 cm.  No suspicious new lesions.  Chronic findings as detailed in the imaging report.   INTERVAL HISTORY ZAKIAH BECKERMAN is a 81 y.o. male who has above history reviewed by me today presents for follow up visit for management of recurrent metastatic colon cancer Patient was accompanied by wife.   Denies fever, chills, nausea, vomiting, diarrhea, chest pain, shortness of breath, abdominal pain, urinary symptoms.  + constipation   Review of Systems  Constitutional:  Negative for appetite change, chills, diaphoresis, fever and unexpected weight change.  HENT:   Negative for hearing loss, nosebleeds, sore throat and tinnitus.   Respiratory:  Negative for cough, hemoptysis and shortness of breath.   Cardiovascular:  Negative for chest pain and palpitations.  Gastrointestinal:  Negative for abdominal pain, blood in stool, constipation, diarrhea, nausea and vomiting.  Genitourinary:  Negative for dysuria, frequency and hematuria.   Musculoskeletal:  Positive for arthralgias. Negative for back pain, myalgias and neck pain.  Skin:  Negative for itching and rash.  Neurological:  Positive for numbness. Negative for dizziness and headaches.  Hematological:  Does not bruise/bleed easily.  Psychiatric/Behavioral:  Negative for depression. The patient is not nervous/anxious.       Past Medical History:  Diagnosis Date   Arthritis    OSTEOARTHRITIS   Cancer ([place])    Cavitary lesion of lung    RIGHT LOWER LOBE   Chicken pox    Colon cancer ([facility])    History of kidney stones    Hypertension    Lipoma of colon    Nephrolithiasis    Nephrolithiasis    Obesity    Shingles    Tubular adenoma of colon    multiple fragments    Past Surgical History:  Procedure Laterality Date   COLON SURGERY     COLONOSCOPY N/A 10/02/2014   Procedure: COLONOSCOPY;  Surgeon: Josefine Class, MD;  Location[facility] ENDOSCOPY;  Service: Endoscopy;  Laterality: N/A;   COLONOSCOPY WITH  PROPOFOL N/A 02/16/2017   Procedure: COLONOSCOPY WITH PROPOFOL;  Surgeon: Manya Silvas, MD;  Location: [facility] ENDOSCOPY;  Service: Endoscopy;  Laterality: N/A;   COLONOSCOPY WITH PROPOFOL N/A 10/05/2020   Procedure: COLONOSCOPY WITH PROPOFOL;  Surgeon: Lesly Rubenstein, MD;  Location: [facility] ENDOSCOPY;  Service: Endoscopy;  Laterality: N/A;   EUS N/A 04/17/2019   Procedure: FULL UPPER ENDOSCOPIC ULTRASOUND (EUS) RADIAL;  Surgeon: Holly Bodily, MD;  Location: [facility] ENDOSCOPY;  Service: Gastroenterology;  Laterality: N/A;   KIDNEY STONE SURGERY     PARTIAL COLECTOMY  10/17/2013   PORTACATH PLACEMENT Right 06/13/2019   Procedure: INSERTION PORT-A-CATH;  Surgeon: Benjamine Sprague, DO;  Location: [facility] ORS;  Service: General;  Laterality: Right;    Family History  Problem Relation Age of Onset   Cancer Mother    Breast cancer Mother    COPD Father     Social History:  reports that he has never smoked. He has never used smokeless tobacco. He reports current alcohol use. He reports that he does not use drugs.    Allergies: No Known Allergies  Current Medications: Current Outpatient Medications  Medication Sig Dispense Refill   acetaminophen (TYLENOL) 500 MG tablet Take 500 mg by mouth every 6 (six) hours as needed.     amLODipine (NORVASC) 2.5 MG tablet Take 2.5 mg by mouth daily.     aspirin EC 81 MG tablet Take 81 mg by mouth daily.     Calcium Carbonate (CALCIUM 600 PO) Take 1 tablet by mouth 2 (two) times daily.     Cholecalciferol (VITAMIN D) 125 MCG (5000 UT) CAPS Take 5,000 mg by mouth.     gabapentin (NEURONTIN) 100 MG capsule Take 1 capsule (100 mg total) by mouth 3 (three) times daily. 90 capsule 0   HYDROcodone-acetaminophen (NORCO/VICODIN) 5-325 MG tablet Take 1 tablet by mouth every 6 (six) hours as needed for moderate pain (up to 3 doses for moderate pain.).     KLOR-CON M20 20 MEQ tablet TAKE 1 TABLET BY MOUTH TWICE A DAY 60 tablet 0   lidocaine-prilocaine (EMLA) cream  Apply to affected area once 30 g 3   loperamide (IMODIUM) 2 MG capsule Take 1 capsule (2 mg total) by mouth See admin instructions. Take 2 tablets after first loose stool,  then 1 tablet  after each loose stool; maximum: 8 tablets /day 60 capsule 0   loratadine (CLARITIN) 10 MG tablet Take 10 mg by mouth daily.     ondansetron (  ZOFRAN) 8 MG tablet Take 1 tablet (8 mg total) by mouth 2 (two) times daily as needed for refractory nausea / vomiting. Start on day 3 after chemotherapy. 60 tablet 1   Probiotic Product (PROBIOTIC DAILY PO) Take 1 tablet by mouth daily.     prochlorperazine (COMPAZINE) 10 MG tablet Take 1 tablet (10 mg total) by mouth every 6 (six) hours as needed (NAUSEA). 30 tablet 1   No current facility-administered medications for this visit.   Facility-Administered Medications Ordered in Other Visits  Medication Dose Route Frequency Provider Last Rate Last Admin   0.9 %  sodium chloride infusion   Intravenous Once Corcoran, Melissa C, MD       0.9 %  sodium chloride infusion   Intravenous Continuous Lequita Asal, MD 10 mL/hr at 12/31/19 1000 New Bag at 10/05/20 1045   fluorouracil (ADRUCIL) 5,700 mg in sodium chloride 0.9 % 136 mL chemo infusion  2,400 mg/m2 (Order-Specific) Intravenous 1 day or 1 dose [name], MD       fluorouracil (ADRUCIL) chemo injection 950 mg  400 mg/m2 (Order-Specific) Intravenous Once [name], MD       heparin lock flush 100 unit/mL  500 Units Intravenous Once Lequita Asal, MD        Performance status (ECOG): 1  Vitals There were no vitals taken for this visit.   Physical Exam Vitals and nursing note reviewed.  Constitutional:      General: He is not in acute distress.    Appearance: He is well-developed. He is obese. He is not diaphoretic.     Interventions: Face mask in place.  HENT:     Head: Normocephalic and atraumatic.     Mouth/Throat:     Mouth: Mucous membranes are moist.     Pharynx: Oropharynx is clear.  Eyes:      General: No scleral icterus.    Extraocular Movements: Extraocular movements intact.     Conjunctiva/sclera: Conjunctivae normal.     Pupils: Pupils are equal, round, and reactive to light.  Cardiovascular:     Rate and Rhythm: Normal rate and regular rhythm.     Heart sounds: Normal heart sounds. No murmur heard. Pulmonary:     Effort: Pulmonary effort is normal. No respiratory distress.     Breath sounds: No wheezing or rales.     Comments: Decreased breath sound bilaterally Chest:     Chest wall: No tenderness.  Abdominal:     General: Bowel sounds are normal. There is no distension.     Palpations: Abdomen is soft. There is no mass.     Tenderness: There is no abdominal tenderness. There is no guarding or rebound.  Musculoskeletal:        General: No swelling or tenderness. Normal range of motion.     Cervical back: Normal range of motion and neck supple.     Right lower leg: Edema present.     Left lower leg: Edema present.  Lymphadenopathy:     Head:     Right side of head: No preauricular, posterior auricular or occipital adenopathy.     Left side of head: No preauricular, posterior auricular or occipital adenopathy.     Cervical: No cervical adenopathy.     Upper Body:     Right upper body: No supraclavicular or axillary adenopathy.     Left upper body: No supraclavicular or axillary adenopathy.     Lower Body: No right inguinal adenopathy. No left inguinal adenopathy.  Skin:    General: Skin is warm and dry.  Neurological:     General: No focal deficit present.     Mental Status: He is alert and oriented to person, place, and time. Mental status is at baseline.  Psychiatric:        Mood and Affect: Mood normal.    No visits with results within 3 Day(s) from this visit.  Latest known visit with results is:  Infusion on 10/12/2021  Component Date Value Ref Range Status   Total Protein, Urine 10/12/2021 17  mg/dL Final   Comment: NO NORMAL RANGE ESTABLISHED FOR THIS  TEST Performed at [facility], [address] [place], [place] [address]    Sodium 10/12/2021 134 (L)  135 - 145 mmol/L Final   Potassium 10/12/2021 4.2  3.5 - 5.1 mmol/L Final   Chloride 10/12/2021 102  98 - 111 mmol/L Final   CO2 10/12/2021 26  22 - 32 mmol/L Final   Glucose, Bld 10/12/2021 99  70 - 99 mg/dL Final   Glucose reference range applies only to samples taken after fasting for at least 8 hours.   BUN 10/12/2021 12  8 - 23 mg/dL Final   Creatinine, Ser 10/12/2021 0.86  0.61 - 1.24 mg/dL Final   Calcium 10/12/2021 9.2  8.9 - 10.3 mg/dL Final   Total Protein 10/12/2021 6.9  6.5 - 8.1 g/dL Final   Albumin 10/12/2021 3.6  3.5 - 5.0 g/dL Final   AST 10/12/2021 52 (H)  15 - 41 U/L Final   ALT 10/12/2021 30  0 - 44 U/L Final   Alkaline Phosphatase 10/12/2021 38  38 - 126 U/L Final   Total Bilirubin 10/12/2021 1.3 (H)  0.3 - 1.2 mg/dL Final   GFR, Estimated 10/12/2021 >60  >60 mL/min Final   Comment: (NOTE) Calculated using the CKD-EPI Creatinine Equation (2021)    Anion gap 10/12/2021 6  5 - 15 Final   Performed at [facility], [place], [place] [address]   WBC 10/12/2021 4.7  4.0 - 10.5 K/uL Final   RBC 10/12/2021 3.98 (L)  4.22 - 5.81 MIL/uL Final   Hemoglobin 10/12/2021 12.6 (L)  13.0 - 17.0 g/dL Final   HCT 10/12/2021 38.3 (L)  39.0 - 52.0 % Final   MCV 10/12/2021 96.2  80.0 - 100.0 fL Final   MCH 10/12/2021 31.7  26.0 - 34.0 pg Final   MCHC 10/12/2021 32.9  30.0 - 36.0 g/dL Final   RDW 10/12/2021 14.1  11.5 - 15.5 % Final   Platelets 10/12/2021 173  150 - 400 K/uL Final   nRBC 10/12/2021 0.0  0.0 - 0.2 % Final   Neutrophils Relative % 10/12/2021 56  % Final   Neutro Abs 10/12/2021 2.6  1.7 - 7.7 K/uL Final   Lymphocytes Relative 10/12/2021 31  % Final   Lymphs Abs 10/12/2021 1.5  0.7 - 4.0 K/uL Final   Monocytes Relative 10/12/2021 11  % Final   Monocytes Absolute 10/12/2021 0.5  0.1 - 1.0 K/uL Final   Eosinophils Relative 10/12/2021  1  % Final   Eosinophils Absolute 10/12/2021 0.1  0.0 - 0.5 K/uL Final   Basophils Relative 10/12/2021 1  % Final   Basophils Absolute 10/12/2021 0.1  0.0 - 0.1 K/uL Final   Immature Granulocytes 10/12/2021 0  % Final   Abs Immature Granulocytes 10/12/2021 0.02  0.00 - 0.07 K/uL Final   Performed at [facility], [address],  [place], [place] [address]

## 2021-10-26 NOTE — Assessment & Plan Note (Signed)
continue to monitor.  Hemoglobin is close to baseline. 

## 2021-10-26 NOTE — Assessment & Plan Note (Signed)
Colace '100mg'$  1-2 times daily.

## 2021-10-26 NOTE — Patient Instructions (Signed)
[facility]  Discharge Instructions: Thank you for choosing [facility] to provide your oncology and hematology care.  If you have a lab appointment with the Cancer Center, please go directly to the Cancer Center and check in at the registration area.  Wear comfortable clothing and clothing appropriate for easy access to any Portacath or PICC line.   We strive to give you quality time with your provider. You may need to reschedule your appointment if you arrive late (15 or more minutes).  Arriving late affects you and other patients whose appointments are after yours.  Also, if you miss three or more appointments without notifying the office, you may be dismissed from the clinic at the provider's discretion.      For prescription refill requests, have your pharmacy contact our office and allow 72 hours for refills to be completed.       To help prevent nausea and vomiting after your treatment, we encourage you to take your nausea medication as directed.  BELOW ARE SYMPTOMS THAT SHOULD BE REPORTED IMMEDIATELY: *FEVER GREATER THAN 100.4 F (38 C) OR HIGHER *CHILLS OR SWEATING *NAUSEA AND VOMITING THAT IS NOT CONTROLLED WITH YOUR NAUSEA MEDICATION *UNUSUAL SHORTNESS OF BREATH *UNUSUAL BRUISING OR BLEEDING *URINARY PROBLEMS (pain or burning when urinating, or frequent urination) *BOWEL PROBLEMS (unusual diarrhea, constipation, pain near the anus) TENDERNESS IN MOUTH AND THROAT WITH OR WITHOUT PRESENCE OF ULCERS (sore throat, sores in mouth, or a toothache) UNUSUAL RASH, SWELLING OR PAIN  UNUSUAL VAGINAL DISCHARGE OR ITCHING   Items with * indicate a potential emergency and should be followed up as soon as possible or go to the Emergency Department if any problems should occur.  Please show the CHEMOTHERAPY ALERT CARD or IMMUNOTHERAPY ALERT CARD at check-in to the Emergency Department and triage nurse.  Should you have questions after your  visit or need to cancel or reschedule your appointment, please contact [facility]  [phone] and follow the prompts.  Office hours are 8:00 a.m. to 4:30 p.m. Monday - Friday. Please note that voicemails left after 4:00 p.m. may not be returned until the following business day.  We are closed weekends and major holidays. You have access to a nurse at all times for urgent questions. Please call the main number to the clinic [phone] and follow the prompts.  For any non-urgent questions, you may also contact your provider using MyChart. We now offer e-Visits for anyone 18 and older to request care online for non-urgent symptoms. For details visit [email or web address].   Also download the MyChart app! Go to the app store, search "MyChart", open the app, select [facility], and log in with your MyChart username and password.  Masks are optional in the cancer centers. If you would like for your care team to wear a mask while they are taking care of you, please let them know. For doctor visits, patients may have with them one support person who is at least 81 years old. At this time, visitors are not allowed in the infusion area.   

## 2021-10-26 NOTE — Progress Notes (Signed)
OK to use urine protein from last visit per MD.

## 2021-10-26 NOTE — Assessment & Plan Note (Signed)
Overall he tolerates chemotherapy.   CEA is relatively stable with small fluctuations. Labs reviewed and discussed with patient Proceed with 5-FU/bevacizumab treatment today Plan to repeat PET scan when he is due for next imaging for further evaluation of activity of the mass-July 2023 

## 2021-10-26 NOTE — Assessment & Plan Note (Signed)
Same as above

## 2021-10-26 NOTE — Assessment & Plan Note (Signed)
He gabapentin due to patient's preference.  Patient has gabapentin supply at home.Patient declines gabapentin and acupuncture.

## 2021-10-28 ENCOUNTER — Inpatient Hospital Stay: Payer: Medicare Other

## 2021-10-28 VITALS — BP 162/91 | HR 68 | Temp 96.9°F | Resp 18

## 2021-10-28 DIAGNOSIS — C786 Secondary malignant neoplasm of retroperitoneum and peritoneum: Secondary | ICD-10-CM

## 2021-10-28 DIAGNOSIS — Z85038 Personal history of other malignant neoplasm of large intestine: Secondary | ICD-10-CM

## 2021-10-28 DIAGNOSIS — Z5112 Encounter for antineoplastic immunotherapy: Secondary | ICD-10-CM | POA: Diagnosis not present

## 2021-10-28 MED ORDER — HEPARIN SOD (PORK) LOCK FLUSH 100 UNIT/ML IV SOLN
500.0000 [IU] | Freq: Once | INTRAVENOUS | Status: AC | PRN
  Administered 2021-10-28: 500 [IU]
  Filled 2021-10-28: qty 5

## 2021-10-28 MED ORDER — SODIUM CHLORIDE 0.9% FLUSH
10.0000 mL | INTRAVENOUS | Status: DC | PRN
  Administered 2021-10-28: 10 mL
  Filled 2021-10-28: qty 10

## 2021-11-08 ENCOUNTER — Inpatient Hospital Stay (HOSPITAL_BASED_OUTPATIENT_CLINIC_OR_DEPARTMENT_OTHER): Payer: Medicare Other | Admitting: Oncology

## 2021-11-08 ENCOUNTER — Inpatient Hospital Stay: Payer: Medicare Other

## 2021-11-08 ENCOUNTER — Encounter: Payer: Self-pay | Admitting: Oncology

## 2021-11-08 VITALS — BP 116/71 | HR 75 | Temp 96.9°F | Resp 18 | Wt 245.9 lb

## 2021-11-08 DIAGNOSIS — C786 Secondary malignant neoplasm of retroperitoneum and peritoneum: Secondary | ICD-10-CM

## 2021-11-08 DIAGNOSIS — T451X5D Adverse effect of antineoplastic and immunosuppressive drugs, subsequent encounter: Secondary | ICD-10-CM

## 2021-11-08 DIAGNOSIS — D6481 Anemia due to antineoplastic chemotherapy: Secondary | ICD-10-CM

## 2021-11-08 DIAGNOSIS — Z5111 Encounter for antineoplastic chemotherapy: Secondary | ICD-10-CM

## 2021-11-08 DIAGNOSIS — C184 Malignant neoplasm of transverse colon: Secondary | ICD-10-CM | POA: Diagnosis not present

## 2021-11-08 DIAGNOSIS — Z5112 Encounter for antineoplastic immunotherapy: Secondary | ICD-10-CM | POA: Diagnosis not present

## 2021-11-08 DIAGNOSIS — Z85038 Personal history of other malignant neoplasm of large intestine: Secondary | ICD-10-CM

## 2021-11-08 LAB — COMPREHENSIVE METABOLIC PANEL
ALT: 21 U/L (ref 0–44)
AST: 36 U/L (ref 15–41)
Albumin: 3.6 g/dL (ref 3.5–5.0)
Alkaline Phosphatase: 37 U/L — ABNORMAL LOW (ref 38–126)
Anion gap: 6 (ref 5–15)
BUN: 11 mg/dL (ref 8–23)
CO2: 24 mmol/L (ref 22–32)
Calcium: 9.3 mg/dL (ref 8.9–10.3)
Chloride: 105 mmol/L (ref 98–111)
Creatinine, Ser: 0.78 mg/dL (ref 0.61–1.24)
GFR, Estimated: >60 mL/min (ref >60)
Glucose, Bld: 93 mg/dL (ref 70–99)
Potassium: 4.1 mmol/L (ref 3.5–5.1)
Sodium: 135 mmol/L (ref 135–145)
Total Bilirubin: 0.8 mg/dL (ref 0.3–1.2)
Total Protein: 6.6 g/dL (ref 6.5–8.1)

## 2021-11-08 LAB — CBC WITH DIFFERENTIAL/PLATELET
Abs Immature Granulocytes: 0.01 10*3/uL (ref 0.00–0.07)
Basophils Absolute: 0 10*3/uL (ref 0.0–0.1)
Basophils Relative: 1 %
Eosinophils Absolute: 0.1 10*3/uL (ref 0.0–0.5)
Eosinophils Relative: 1 %
HCT: 36.7 % — ABNORMAL LOW (ref 39.0–52.0)
Hemoglobin: 12.1 g/dL — ABNORMAL LOW (ref 13.0–17.0)
Immature Granulocytes: 0 %
Lymphocytes Relative: 34 %
Lymphs Abs: 1.7 10*3/uL (ref 0.7–4.0)
MCH: 31.8 pg (ref 26.0–34.0)
MCHC: 33 g/dL (ref 30.0–36.0)
MCV: 96.6 fL (ref 80.0–100.0)
Monocytes Absolute: 0.5 10*3/uL (ref 0.1–1.0)
Monocytes Relative: 11 %
Neutro Abs: 2.6 10*3/uL (ref 1.7–7.7)
Neutrophils Relative %: 53 %
Platelets: 165 10*3/uL (ref 150–400)
RBC: 3.8 MIL/uL — ABNORMAL LOW (ref 4.22–5.81)
RDW: 14.4 % (ref 11.5–15.5)
WBC: 4.9 10*3/uL (ref 4.0–10.5)
nRBC: 0 % (ref 0.0–0.2)

## 2021-11-08 LAB — PROTEIN, URINE, RANDOM: Total Protein, Urine: 12 mg/dL

## 2021-11-08 MED ORDER — SODIUM CHLORIDE 0.9 % IV SOLN
10.0000 mg | Freq: Once | INTRAVENOUS | Status: AC
  Administered 2021-11-08: 10 mg via INTRAVENOUS
  Filled 2021-11-08: qty 10

## 2021-11-08 MED ORDER — HEPARIN SOD (PORK) LOCK FLUSH 100 UNIT/ML IV SOLN
500.0000 [IU] | Freq: Once | INTRAVENOUS | Status: DC | PRN
  Filled 2021-11-08: qty 5

## 2021-11-08 MED ORDER — FLUOROURACIL CHEMO INJECTION 2.5 GM/50ML
400.0000 mg/m2 | Freq: Once | INTRAVENOUS | Status: AC
  Administered 2021-11-08: 950 mg via INTRAVENOUS
  Filled 2021-11-08: qty 19

## 2021-11-08 MED ORDER — SODIUM CHLORIDE 0.9 % IV SOLN
5.0000 mg/kg | Freq: Once | INTRAVENOUS | Status: AC
  Administered 2021-11-08: 600 mg via INTRAVENOUS
  Filled 2021-11-08: qty 16

## 2021-11-08 MED ORDER — SODIUM CHLORIDE 0.9 % IV SOLN
2400.0000 mg/m2 | INTRAVENOUS | Status: DC
  Administered 2021-11-08: 5700 mg via INTRAVENOUS
  Filled 2021-11-08: qty 114

## 2021-11-08 MED ORDER — SODIUM CHLORIDE 0.9 % IV SOLN
Freq: Once | INTRAVENOUS | Status: AC
  Filled 2021-11-08: qty 250

## 2021-11-08 MED ORDER — SODIUM CHLORIDE 0.9 % IV SOLN
400.0000 mg/m2 | Freq: Once | INTRAVENOUS | Status: AC
  Administered 2021-11-08: 948 mg via INTRAVENOUS
  Filled 2021-11-08: qty 47.4

## 2021-11-08 NOTE — Progress Notes (Signed)
Hematology/Oncology Progress note [phone] [phone]      Clinic Day: 11/08/2021     ASSESSMENT & PLAN:  Cancer of transverse colon (HCC) Overall he tolerates chemotherapy.   CEA is relatively stable with small fluctuations. 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   Anemia due to chemotherapy continue to monitor.  Hemoglobin is close to baseline.  Encounter for antineoplastic chemotherapy Chemotherapy plan as listed above.  Orders Placed This Encounter  Procedures   CEA    Standing Status:   Standing    Number of Occurrences:   40    Standing Expiration Date:   11/09/2022    Follow-up 2 weeks lab MD 5-FU/bevacizumab -   All questions were answered. The patient knows to call the clinic with any problems, questions or concerns.  [name], MD, PhD [facility] Hematology Oncology 11/08/2021   Chief Complaint: Jared Gutierrez is a 81 y.o. male presents for follow-up of metastatic colon cancer   PERTINENT ONCOLOGY HISTORY Jared Gutierrez is a 81 y.o.[place] who has above oncology history reviewed by me today presented for follow up visit for management of Metastatic colon cancer. [name], patient switched care to me on 11/11/20 Extensive medical record review was performed by me  Oncology History  History of colon cancer, stage I  09/23/2013 Pathology Results   Part A: TRANSVERSE COLON POLYP HOT SNARE:  - TUBULOVILLOUS ADENOMA, SEE COMMENT.  - NEGATIVE FOR HIGH GRADE DYSPLASIA AND MALIGNANCY.  .  Part B: SPLENIC FLEXURE MASS COLD BIOPSY:  - SUPERFICIAL SAMPLING OF A TUBULOVILLOUS ADENOMA WITH AT LEAST  HIGH GRADE DYSPLASIA, SEE COMMENT.    09/23/2013 Initial Diagnosis   His initial cancer was  discovered through screening colonoscopy   10/17/2013 Pathology Results   TRANSVERSE COLON, RESECTION:  - INVASIVE ADENOCARCINOMA ARISING IN A 4.6 CM TUBULOVILLOUS  ADENOMA WITH HIGH GRADE DYSPLASIA (MALIGNANT POLYP).  - SEE SUMMARY BELOW.  [name]  ONCOLOGY SUMMARY: COLON AND RECTUM, RESECTION, AJCC 7TH EDITION  Specimen: transverse colon  Procedure: transverse colon resection  Tumor site: splenic flexure  Tumor size: 1.0 cm (invasive component)  Macroscopic tumor perforation: not specified  Histologic type: invasive adenocarcinoma  Histologic grade: moderately differentiated  Microscopic tumor extension: into submucosa  Margins:       Proximal margin: negative       Distal margin: negative       Circumferential (radial) or mesenteric margin: negative       If all margins uninvolved by invasive carcinoma:       Distance of invasive carcinoma from closest margin: 7.5 cm  to distal margin  Treatment effect: not applicable  Lymph-vascular invasion: present  Perineural invasion: not identified  Tumor deposits (discontinuous extramural extension): not  identified  Pathologic staging:       Primary tumor: pT1       Regional lymph nodes: pN0            Number of nodes examined: 14            Number of nodes involved: 0    10/17/2013 Surgery   He underwent transverse colectomy   10/02/2014 Procedure   He has surveillance colonoscopy. 4 colon polyps were removed   10/02/2014 Pathology Results   [id number]  colonoscopy pathology A. CECAL POLYPS; HOT SNARE AND COLD BIOPSY:  - TUBULAR ADENOMA, MULTIPLE FRAGMENTS.  - NEGATIVE FOR HIGH-GRADE DYSPLASIA AND MALIGNANCY.   B. COLON POLYP, TRANSVERSE; COLD BIOPSY:  - TUBULAR ADENOMA.  - NEGATIVE FOR HIGH-GRADE DYSPLASIA AND MALIGNANCY.     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  Tumor extended into the submucosa. Margins were negative. + lymphovascular invasion. 14 lymph nodes were negative. Pathologic stage was T1 N0.     04/02/2019 Progression   04/02/2019  PET scan  limited evealed a 2.4 x 2.3 cm (SUV 11) hypermetabolic soft tissue density caudal and anterior to the pancreatic neck favored to represent isolated peritoneal or nodal metastasis in the setting of prior transverse colonic resection (expected primary drainage). Although this was immediately adjacent to the pancreas, a fat plane was maintained, arguing strongly against a pancreatic primary. Otherwise, there was no evidence of hypermetabolic metastasis. 04/17/2019 EUS on  revealed a normal esophagus, stomach, duodenum, and pancreas.  There was a 2.4 x 2.4 cm irregular mass in the retroperitoneum adjacent to, but not involving the pancreatic neck.  FNA and core needle biopsy were performed.  Pathology revealed adenocarcinoma compatible with a metastatic lesion of colorectal origin.  Tumor cells were positive for CK20 and CDX2 and negative for CK7.  NGS: [employer] on 05/15/2019 revealed + [id number] and TP53.  Negative results included BRAF V600E, Her2, NRAS, NTRK, PD-L1 (<1%), and TMB 8.7/Mb (intermediate).  MMR testing from his colon resection on 10/16/2013 was intact with a low probability of MSI-H.   07/09/2019 -  Chemotherapy   He received 11 cycles of FOLFOX chemotherapy and 1 cycle of 5-FU and leucovorin (12/31/2019).  He received Neulasta after cycle #4  and #5 secondary to progressive leukopenia.  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  He received a truncated course of FOLFOX with cycle #11 secondary to oxaliplatin reaction.   01/14/2020 Imaging    PET revealed an interval decrease in size and FDG uptake (2.4 x 2.3 cm with SUV 11 to 2.4 x 1.7 cm with SUV 4.09) associated with the previously referenced soft tissue density caudal and anterior to the pancreatic neck suggesting treatment response. There were no new sites of FDG avid tumor.   08/31/2020 Imaging   08/31/2020 Abdomen and pelvis CT revealed increased size of the index soft tissue lesion inferior and anterior to the pancreatic neck (2.9 x 1.8 cm to 3.5 x 2.9 cm). There were similar prominent retroperitoneal lymph nodes without adenopathy by size criteria. There was no new or enlarging abdominal or pelvic lymph nodes. There were no new interval findings. There was similar circumferential wall thickening of a nondistended urinary bladder, which likely accentuated wall thickening There was hepatic steatosis and aortic atherosclerosis.   11/03/2020, PET showed recurrent peritoneal metastasis in the upper abdomen adjacent to  the pancreas.  The lesion is 3.8 x 2.9 cm with SUV of 11.3. No evidence of metastatic peritoneal disease elsewhere in the abdomen pelvis.  No evidence of distant metastasis.   11/26/2020 -  Chemotherapy   5-FU and bevacizumab.   10/26/2021 Cancer Staging   Staging form: Colon and Rectum, [name], [name], [name], MD on 10/26/2021 Stage prefix: Recurrence Total positive nodes: 0   02/17/2022 Imaging   02/17/2021 CT abdomen pelvis showed partial response. Mild decrease of peritoneal lesion.  Proceed with 5-FU and bevacizumab today.  Given that he is tolerating current regimen with good life quality, partial response.  Shared decision was made to hold off adding additional chemotherapy agents.   06/08/2021, CT  chest abdomen pelvis with contrast showed soft tissue mass at the base of mesentery inferior to the pancreatic neck is unchanged in size.  No progressive disease was identified.   09/07/2021, CT chest abdomen pelvis with contrast showed no substantial changes in size of the mesenteric mass, 2.6 cm.  No suspicious new lesions.  Chronic findings as detailed in the imaging report.     Other medical problems Chronic lower extremity edema. 12/24/2018, right lower extremity duplex negative for DVT.  Small right Baker's cyst. 08/16/2019, bilateral lower extremity duplex showed no DVT.  08/16/2019 - 08/17/2019 with right lower extremity cellulitis.  He was unable to bear weight.  He was treated with IV fluids, NSAIDs, colchicine, and broad antibiotics (vancomycin and Cefepime).  He was discharged on indomethacin x 5 days and Keflex 500 mg TID x 5 days.  10/05/2020, colonoscopy showed internal hemorrhoids.  Otherwise normal examination. 11/03/2020 PET scan showed recurrent peritoneal metastasis near pancreas. Given that he has no other distant metastasis. Discussed with radiation oncology. Repeat SBRT may be considered if he does not respond well to systemic chemotherapy.    11/26/2020 patient was started on 5-FU and bevacizumab. 12/14/2020 CEA 29.5 02/17/2021 CT abdomen pelvis showed partial response. Mild decrease of peritoneal lesion.  Proceed with 5-FU and bevacizumab today.  Given that he is tolerating current regimen with good life quality, partial response.  Shared decision was made to hold off adding additional chemotherapy agents.  06/08/2021, CT chest abdomen pelvis with contrast showed soft tissue mass at the base of mesentery inferior to the pancreatic neck is unchanged in size.  No progressive disease was identified.  09/07/2021, CT chest abdomen pelvis with contrast showed no substantial changes in size of the mesenteric mass, 2.6 cm.  No suspicious new lesions.  Chronic findings as detailed in the  imaging report.   INTERVAL HISTORY Jared Gutierrez is a 81 y.o. male who has above history reviewed by me today presents for follow up visit for management of recurrent metastatic colon cancer Patient was accompanied by wife.   Denies fever, chills, nausea, vomiting, diarrhea, chest pain, shortness of breath, abdominal pain, urinary symptoms.  + constipation   Review of Systems  Constitutional:  Negative for appetite change, chills, diaphoresis, fever and unexpected weight change.  HENT:   Negative for hearing loss, nosebleeds, sore throat and tinnitus.   Respiratory:  Negative for cough, hemoptysis and shortness of breath.   Cardiovascular:  Negative for chest pain and palpitations.  Gastrointestinal:  Negative for abdominal pain, blood in stool, constipation, diarrhea, nausea and vomiting.  Genitourinary:  Negative for dysuria, frequency and hematuria.   Musculoskeletal:  Positive for arthralgias. Negative for back pain, myalgias and neck pain.  Skin:  Negative for itching and rash.  Neurological:  Positive for numbness. Negative for dizziness and headaches.  Hematological:  Does not bruise/bleed easily.  Psychiatric/Behavioral:  Negative for depression. The patient is not nervous/anxious.       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   Chicken pox    Colon cancer (HCC)    History of kidney stones    Hypertension    Lipoma of colon    Nephrolithiasis    Nephrolithiasis    Obesity    Shingles    Tubular adenoma of colon    multiple fragments    Past Surgical History:  Procedure Laterality Date   COLON SURGERY     COLONOSCOPY N/A 10/02/2014   Procedure: COLONOSCOPY;  Surgeon: Elnita Maxwell, MD;  Location: [facility] ENDOSCOPY;  Service: Endoscopy;  Laterality: N/A;   COLONOSCOPY WITH PROPOFOL N/A 02/16/2017   Procedure: COLONOSCOPY WITH PROPOFOL;  Surgeon: Scot Jun, MD;   Location: [facility] ENDOSCOPY;  Service: Endoscopy;  Laterality: N/A;   COLONOSCOPY WITH PROPOFOL N/A 10/05/2020   Procedure: COLONOSCOPY WITH PROPOFOL;  Surgeon: Regis Bill, MD;  Location: [facility] ENDOSCOPY;  Service: Endoscopy;  Laterality: N/A;   EUS N/A 04/17/2019   Procedure: FULL UPPER ENDOSCOPIC ULTRASOUND (EUS) RADIAL;  Surgeon: Bearl Mulberry, MD;  Location: [facility] ENDOSCOPY;  Service: Gastroenterology;  Laterality: N/A;   KIDNEY STONE SURGERY     PARTIAL COLECTOMY  10/17/2013   PORTACATH PLACEMENT Right 06/13/2019   Procedure: INSERTION PORT-A-CATH;  Surgeon: Sung Amabile, DO;  Location: [facility] ORS;  Service: General;  Laterality: Right;    Family History  Problem Relation Age of Onset   Cancer Mother    Breast cancer Mother    COPD Father     Social History:  reports that he has never smoked. He has never used smokeless tobacco. He reports current alcohol use. He reports that he does not use drugs.    Allergies: No Known Allergies  Current Medications: Current Outpatient Medications  Medication Sig Dispense Refill   acetaminophen (TYLENOL) 500 MG tablet Take 500 mg by mouth every 6 (six) hours as needed.     amLODipine (NORVASC) 2.5 MG tablet Take 2.5 mg by mouth daily.     aspirin EC 81 MG tablet Take 81 mg by mouth daily.     Calcium Carbonate (CALCIUM 600 PO) Take 1 tablet by mouth 2 (two) times daily.     Cholecalciferol (VITAMIN D) 125 MCG (5000 UT) CAPS Take 5,000 mg by mouth.     docusate sodium (COLACE) 100 MG capsule Take 1 capsule (100 mg total) by mouth 2 (two) times daily as needed for mild constipation or moderate constipation. 60 capsule 3   gabapentin (NEURONTIN) 100 MG capsule Take 1 capsule (100 mg total) by mouth 3 (three) times daily. 90 capsule 0   HYDROcodone-acetaminophen (NORCO/VICODIN) 5-325 MG tablet Take 1 tablet by mouth every 6 (six) hours as needed for moderate pain (up to 3 doses for moderate pain.).     KLOR-CON M20 20 MEQ tablet TAKE 1  TABLET BY MOUTH TWICE A DAY 60 tablet 0   lidocaine-prilocaine (EMLA) cream Apply to affected area once 30 g 3   loperamide (IMODIUM) 2 MG capsule Take 1 capsule (2 mg total) by mouth See admin instructions. Take 2 tablets after first loose stool,  then 1  tablet  after each loose stool; maximum: 8 tablets /day 60 capsule 0   loratadine (CLARITIN) 10 MG tablet Take 10 mg by mouth daily.     olmesartan (BENICAR) 20 MG tablet Take 1 tablet by mouth daily.     ondansetron (ZOFRAN) 8 MG tablet Take 1 tablet (8 mg total) by mouth 2 (two) times daily as needed for refractory nausea / vomiting. Start on day 3 after chemotherapy. 60 tablet 1   Probiotic Product (PROBIOTIC DAILY PO) Take 1 tablet by mouth daily.     prochlorperazine (COMPAZINE) 10 MG tablet Take 1 tablet (10 mg total) by mouth every 6 (six) hours as needed (NAUSEA). 30 tablet 1   No current facility-administered medications for this visit.   Facility-Administered Medications Ordered in Other Visits  Medication Dose Route Frequency Provider Last Rate Last Admin   0.9 %  sodium chloride infusion   Intravenous Once Corcoran, Melissa C, MD       0.9 %  sodium chloride infusion   Intravenous Continuous Nelva Nay C, MD 10 mL/hr at 12/31/19 1000 New Bag at 10/05/20 1045   heparin lock flush 100 unit/mL  500 Units Intravenous Once Rosey Bath, MD        Performance status (ECOG): 1  Vitals Blood pressure 116/71, pulse 75, temperature (!) 96.9 F (36.1 C), resp. rate 18, weight 245 lb 14.4 oz (111.5 kg).   Physical Exam Vitals and nursing note reviewed.  Constitutional:      General: He is not in acute distress.    Appearance: He is well-developed. He is obese. He is not diaphoretic.     Interventions: Face mask in place.  HENT:     Head: Normocephalic and atraumatic.     Mouth/Throat:     Mouth: Mucous membranes are moist.     Pharynx: Oropharynx is clear.  Eyes:     General: No scleral icterus.    Extraocular  Movements: Extraocular movements intact.     Conjunctiva/sclera: Conjunctivae normal.     Pupils: Pupils are equal, round, and reactive to light.  Cardiovascular:     Rate and Rhythm: Normal rate and regular rhythm.     Heart sounds: Normal heart sounds. No murmur heard. Pulmonary:     Effort: Pulmonary effort is normal. No respiratory distress.     Breath sounds: No wheezing or rales.     Comments: Decreased breath sound bilaterally Chest:     Chest wall: No tenderness.  Abdominal:     General: Bowel sounds are normal. There is no distension.     Palpations: Abdomen is soft. There is no mass.     Tenderness: There is no abdominal tenderness. There is no guarding or rebound.  Musculoskeletal:        General: No swelling or tenderness. Normal range of motion.     Cervical back: Normal range of motion and neck supple.     Right lower leg: Edema present.     Left lower leg: Edema present.  Lymphadenopathy:     Head:     Right side of head: No preauricular, posterior auricular or occipital adenopathy.     Left side of head: No preauricular, posterior auricular or occipital adenopathy.     Cervical: No cervical adenopathy.     Upper Body:     Right upper body: No supraclavicular or axillary adenopathy.     Left upper body: No supraclavicular or axillary adenopathy.     Lower Body: No right  inguinal adenopathy. No left inguinal adenopathy.  Skin:    General: Skin is warm and dry.  Neurological:     General: No focal deficit present.     Mental Status: He is alert and oriented to person, place, and time. Mental status is at baseline.  Psychiatric:        Mood and Affect: Mood normal.    Infusion on 11/08/2021  Component Date Value Ref Range Status   Total Protein, Urine 11/08/2021 12  mg/dL Final   Comment: NO NORMAL RANGE ESTABLISHED FOR THIS TEST Performed at [facility], [address] [place], [place] [address]    Sodium 11/08/2021 135  135 - 145 mmol/L Final    Potassium 11/08/2021 4.1  3.5 - 5.1 mmol/L Final   Chloride 11/08/2021 105  98 - 111 mmol/L Final   CO2 11/08/2021 24  22 - 32 mmol/L Final   Glucose, Bld 11/08/2021 93  70 - 99 mg/dL Final   Glucose reference range applies only to samples taken after fasting for at least 8 hours.   BUN 11/08/2021 11  8 - 23 mg/dL Final   Creatinine, Ser 11/08/2021 0.78  0.61 - 1.24 mg/dL Final   Calcium 19/14/7829 9.3  8.9 - 10.3 mg/dL Final   Total Protein 56/21/3086 6.6  6.5 - 8.1 g/dL Final   Albumin 57/84/6962 3.6  3.5 - 5.0 g/dL Final   AST 95/28/4132 36  15 - 41 U/L Final   ALT 11/08/2021 21  0 - 44 U/L Final   Alkaline Phosphatase 11/08/2021 37 (L)  38 - 126 U/L Final   Total Bilirubin 11/08/2021 0.8  0.3 - 1.2 mg/dL Final   GFR, Estimated 11/08/2021 >60  >60 mL/min Final   Comment: (NOTE) Calculated using the CKD-EPI Creatinine Equation (2021)    Anion gap 11/08/2021 6  5 - 15 Final   Performed at [facility], [address] [place], [place] [address]   WBC 11/08/2021 4.9  4.0 - 10.5 K/uL Final   RBC 11/08/2021 3.80 (L)  4.22 - 5.81 MIL/uL Final   Hemoglobin 11/08/2021 12.1 (L)  13.0 - 17.0 g/dL Final   HCT 27/25/3664 36.7 (L)  39.0 - 52.0 % Final   MCV 11/08/2021 96.6  80.0 - 100.0 fL Final   MCH 11/08/2021 31.8  26.0 - 34.0 pg Final   MCHC 11/08/2021 33.0  30.0 - 36.0 g/dL Final   RDW 40/34/7425 14.4  11.5 - 15.5 % Final   Platelets 11/08/2021 165  150 - 400 K/uL Final   nRBC 11/08/2021 0.0  0.0 - 0.2 % Final   Neutrophils Relative % 11/08/2021 53  % Final   Neutro Abs 11/08/2021 2.6  1.7 - 7.7 K/uL Final   Lymphocytes Relative 11/08/2021 34  % Final   Lymphs Abs 11/08/2021 1.7  0.7 - 4.0 K/uL Final   Monocytes Relative 11/08/2021 11  % Final   Monocytes Absolute 11/08/2021 0.5  0.1 - 1.0 K/uL Final   Eosinophils Relative 11/08/2021 1  % Final   Eosinophils Absolute 11/08/2021 0.1  0.0 - 0.5 K/uL Final   Basophils Relative 11/08/2021 1  % Final   Basophils Absolute  11/08/2021 0.0  0.0 - 0.1 K/uL Final   Immature Granulocytes 11/08/2021 0  % Final   Abs Immature Granulocytes 11/08/2021 0.01  0.00 - 0.07 K/uL Final   Performed at [facility], [address], [place], [place] [address]

## 2021-11-08 NOTE — Progress Notes (Signed)
Pt here for follow up. No new concerns voiced.   

## 2021-11-08 NOTE — Assessment & Plan Note (Signed)
Same as above

## 2021-11-09 LAB — CEA: CEA: 11.2 ng/mL — ABNORMAL HIGH (ref 0.0–4.7)

## 2021-11-10 ENCOUNTER — Inpatient Hospital Stay: Payer: Medicare Other

## 2021-11-10 VITALS — BP 128/72 | HR 80 | Resp 18

## 2021-11-10 DIAGNOSIS — Z85038 Personal history of other malignant neoplasm of large intestine: Secondary | ICD-10-CM

## 2021-11-10 DIAGNOSIS — C786 Secondary malignant neoplasm of retroperitoneum and peritoneum: Secondary | ICD-10-CM

## 2021-11-10 DIAGNOSIS — Z5112 Encounter for antineoplastic immunotherapy: Secondary | ICD-10-CM | POA: Diagnosis not present

## 2021-11-10 MED ORDER — HEPARIN SOD (PORK) LOCK FLUSH 100 UNIT/ML IV SOLN
500.0000 [IU] | Freq: Once | INTRAVENOUS | Status: AC | PRN
  Administered 2021-11-10: 500 [IU]
  Filled 2021-11-10: qty 5

## 2021-11-10 MED ORDER — SODIUM CHLORIDE 0.9% FLUSH
10.0000 mL | INTRAVENOUS | Status: DC | PRN
  Administered 2021-11-10: 10 mL
  Filled 2021-11-10: qty 10

## 2021-11-12 ENCOUNTER — Other Ambulatory Visit: Payer: Self-pay | Admitting: Oncology

## 2021-11-22 ENCOUNTER — Inpatient Hospital Stay: Payer: Medicare Other

## 2021-11-22 ENCOUNTER — Encounter: Payer: Self-pay | Admitting: Oncology

## 2021-11-22 ENCOUNTER — Inpatient Hospital Stay: Payer: Medicare Other | Attending: Oncology

## 2021-11-22 ENCOUNTER — Inpatient Hospital Stay (HOSPITAL_BASED_OUTPATIENT_CLINIC_OR_DEPARTMENT_OTHER): Payer: Medicare Other | Admitting: Oncology

## 2021-11-22 DIAGNOSIS — Z5111 Encounter for antineoplastic chemotherapy: Secondary | ICD-10-CM

## 2021-11-22 DIAGNOSIS — Z836 Family history of other diseases of the respiratory system: Secondary | ICD-10-CM | POA: Insufficient documentation

## 2021-11-22 DIAGNOSIS — Z803 Family history of malignant neoplasm of breast: Secondary | ICD-10-CM | POA: Insufficient documentation

## 2021-11-22 DIAGNOSIS — K5900 Constipation, unspecified: Secondary | ICD-10-CM | POA: Diagnosis not present

## 2021-11-22 DIAGNOSIS — G620 Drug-induced polyneuropathy: Secondary | ICD-10-CM | POA: Diagnosis not present

## 2021-11-22 DIAGNOSIS — Z86010 Personal history of colonic polyps: Secondary | ICD-10-CM | POA: Diagnosis not present

## 2021-11-22 DIAGNOSIS — C786 Secondary malignant neoplasm of retroperitoneum and peritoneum: Secondary | ICD-10-CM

## 2021-11-22 DIAGNOSIS — Z79899 Other long term (current) drug therapy: Secondary | ICD-10-CM | POA: Diagnosis not present

## 2021-11-22 DIAGNOSIS — C184 Malignant neoplasm of transverse colon: Secondary | ICD-10-CM

## 2021-11-22 DIAGNOSIS — Z809 Family history of malignant neoplasm, unspecified: Secondary | ICD-10-CM | POA: Insufficient documentation

## 2021-11-22 DIAGNOSIS — D6481 Anemia due to antineoplastic chemotherapy: Secondary | ICD-10-CM | POA: Diagnosis not present

## 2021-11-22 DIAGNOSIS — E669 Obesity, unspecified: Secondary | ICD-10-CM | POA: Diagnosis not present

## 2021-11-22 DIAGNOSIS — Z5112 Encounter for antineoplastic immunotherapy: Secondary | ICD-10-CM | POA: Insufficient documentation

## 2021-11-22 DIAGNOSIS — T451X5D Adverse effect of antineoplastic and immunosuppressive drugs, subsequent encounter: Secondary | ICD-10-CM | POA: Insufficient documentation

## 2021-11-22 DIAGNOSIS — T451X5A Adverse effect of antineoplastic and immunosuppressive drugs, initial encounter: Secondary | ICD-10-CM

## 2021-11-22 DIAGNOSIS — Z85038 Personal history of other malignant neoplasm of large intestine: Secondary | ICD-10-CM

## 2021-11-22 LAB — COMPREHENSIVE METABOLIC PANEL
ALT: 31 U/L (ref 0–44)
AST: 50 U/L — ABNORMAL HIGH (ref 15–41)
Albumin: 3.5 g/dL (ref 3.5–5.0)
Alkaline Phosphatase: 37 U/L — ABNORMAL LOW (ref 38–126)
Anion gap: 6 (ref 5–15)
BUN: 11 mg/dL (ref 8–23)
CO2: 25 mmol/L (ref 22–32)
Calcium: 9.3 mg/dL (ref 8.9–10.3)
Chloride: 105 mmol/L (ref 98–111)
Creatinine, Ser: 0.67 mg/dL (ref 0.61–1.24)
GFR, Estimated: >60 mL/min (ref >60)
Glucose, Bld: 130 mg/dL — ABNORMAL HIGH (ref 70–99)
Potassium: 3.9 mmol/L (ref 3.5–5.1)
Sodium: 136 mmol/L (ref 135–145)
Total Bilirubin: 1.2 mg/dL (ref 0.3–1.2)
Total Protein: 6.3 g/dL — ABNORMAL LOW (ref 6.5–8.1)

## 2021-11-22 LAB — CBC WITH DIFFERENTIAL/PLATELET
Abs Immature Granulocytes: 0.01 10*3/uL (ref 0.00–0.07)
Basophils Absolute: 0 10*3/uL (ref 0.0–0.1)
Basophils Relative: 1 %
Eosinophils Absolute: 0.1 10*3/uL (ref 0.0–0.5)
Eosinophils Relative: 2 %
HCT: 36.7 % — ABNORMAL LOW (ref 39.0–52.0)
Hemoglobin: 12.1 g/dL — ABNORMAL LOW (ref 13.0–17.0)
Immature Granulocytes: 0 %
Lymphocytes Relative: 30 %
Lymphs Abs: 1.6 10*3/uL (ref 0.7–4.0)
MCH: 31.8 pg (ref 26.0–34.0)
MCHC: 33 g/dL (ref 30.0–36.0)
MCV: 96.3 fL (ref 80.0–100.0)
Monocytes Absolute: 0.5 10*3/uL (ref 0.1–1.0)
Monocytes Relative: 10 %
Neutro Abs: 3 10*3/uL (ref 1.7–7.7)
Neutrophils Relative %: 57 %
Platelets: 159 10*3/uL (ref 150–400)
RBC: 3.81 MIL/uL — ABNORMAL LOW (ref 4.22–5.81)
RDW: 14.8 % (ref 11.5–15.5)
WBC: 5.2 10*3/uL (ref 4.0–10.5)
nRBC: 0 % (ref 0.0–0.2)

## 2021-11-22 LAB — PROTEIN, URINE, RANDOM: Total Protein, Urine: <6 mg/dL

## 2021-11-22 MED ORDER — SODIUM CHLORIDE 0.9 % IV SOLN
400.0000 mg/m2 | Freq: Once | INTRAVENOUS | Status: AC
  Administered 2021-11-22: 948 mg via INTRAVENOUS
  Filled 2021-11-22: qty 47.4

## 2021-11-22 MED ORDER — SODIUM CHLORIDE 0.9 % IV SOLN
10.0000 mg | Freq: Once | INTRAVENOUS | Status: AC
  Administered 2021-11-22: 10 mg via INTRAVENOUS
  Filled 2021-11-22: qty 1

## 2021-11-22 MED ORDER — SODIUM CHLORIDE 0.9 % IV SOLN
5.0000 mg/kg | Freq: Once | INTRAVENOUS | Status: AC
  Administered 2021-11-22: 600 mg via INTRAVENOUS
  Filled 2021-11-22: qty 16

## 2021-11-22 MED ORDER — SODIUM CHLORIDE 0.9 % IV SOLN
2400.0000 mg/m2 | INTRAVENOUS | Status: DC
  Administered 2021-11-22: 5700 mg via INTRAVENOUS
  Filled 2021-11-22: qty 114

## 2021-11-22 MED ORDER — SODIUM CHLORIDE 0.9 % IV SOLN
Freq: Once | INTRAVENOUS | Status: AC
  Filled 2021-11-22: qty 250

## 2021-11-22 MED ORDER — FLUOROURACIL CHEMO INJECTION 2.5 GM/50ML
400.0000 mg/m2 | Freq: Once | INTRAVENOUS | Status: AC
  Administered 2021-11-22: 950 mg via INTRAVENOUS
  Filled 2021-11-22: qty 19

## 2021-11-22 NOTE — Assessment & Plan Note (Signed)
Same as above

## 2021-11-22 NOTE — Assessment & Plan Note (Signed)
Chemotherapy plan as listed above 

## 2021-11-22 NOTE — Assessment & Plan Note (Signed)
Patient has gabapentin supply at home.Patient declines gabapentin and acupuncture.  

## 2021-11-22 NOTE — Assessment & Plan Note (Signed)
Overall he tolerates chemotherapy.   CEA is relatively stable with small fluctuations. Labs reviewed and discussed with patient Proceed with 5-FU/bevacizumab treatment today Plan to repeat PET scan when he is due for next imaging for further evaluation of activity of the [facility] 2023

## 2021-11-22 NOTE — Assessment & Plan Note (Signed)
continue to monitor.  Hemoglobin is close to baseline.

## 2021-11-22 NOTE — Telephone Encounter (Signed)
Component Ref Range & Units 2 wk ago (11/08/21) 3 wk ago (10/26/21) 1 mo ago (10/12/21) 1 mo ago (09/28/21) 2 mo ago (09/14/21) 2 mo ago (08/31/21) 3 mo ago (08/17/21)  Potassium 3.5 - 5.1 mmol/L 4.1  3.9  4.2  3.5  3.7  3.6  3.8

## 2021-11-22 NOTE — Progress Notes (Signed)
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   CEA is relatively stable with small fluctuations. 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   Encounter for antineoplastic chemotherapy Chemotherapy plan as listed above.   Anemia due to chemotherapy continue to monitor.  Hemoglobin is close to baseline.  Chemotherapy-induced neuropathy ([place]) Patient has gabapentin supply at home.Patient declines gabapentin and acupuncture.  No orders of the defined types were placed in this encounter.   Follow-up 2 weeks lab MD 5-FU/bevacizumab -   All questions were answered. The patient knows to call the clinic with any problems, questions or concerns.  [name], MD, PhD [facility] Hematology Oncology 11/22/2021   Chief Complaint: Jared Gutierrez is a 81 y.o. male presents for follow-up of metastatic colon cancer   PERTINENT ONCOLOGY HISTORY Jared Gutierrez is a 81 y.o.[place] who has above oncology history reviewed by me today presented for follow up visit for management of Metastatic colon cancer. [name], patient switched care to me on 11/11/20 Extensive medical record review was performed by me  Oncology History  History of colon cancer, stage I  09/23/2013 Pathology Results   Part A: TRANSVERSE COLON POLYP HOT SNARE:  - TUBULOVILLOUS ADENOMA, SEE COMMENT.  - NEGATIVE FOR HIGH GRADE DYSPLASIA AND MALIGNANCY.  .  Part B: SPLENIC FLEXURE MASS COLD BIOPSY:  - SUPERFICIAL SAMPLING OF A TUBULOVILLOUS ADENOMA WITH AT LEAST  HIGH GRADE DYSPLASIA, SEE COMMENT.    09/23/2013 Initial  Diagnosis   His initial cancer was discovered through screening colonoscopy   10/17/2013 Pathology Results   TRANSVERSE COLON, RESECTION:  - INVASIVE ADENOCARCINOMA ARISING IN A 4.6 CM TUBULOVILLOUS  ADENOMA WITH HIGH GRADE DYSPLASIA (MALIGNANT POLYP).  - SEE SUMMARY BELOW.  [name]  ONCOLOGY SUMMARY: COLON AND RECTUM, RESECTION, AJCC 7TH EDITION  Specimen: transverse colon  Procedure: transverse colon resection  Tumor site: splenic flexure  Tumor size: 1.0 cm (invasive component)  Macroscopic tumor perforation: not specified  Histologic type: invasive adenocarcinoma  Histologic grade: moderately differentiated  Microscopic tumor extension: into submucosa  Margins:       Proximal margin: negative       Distal margin: negative       Circumferential (radial) or mesenteric margin: negative       If all margins uninvolved by invasive carcinoma:       Distance of invasive carcinoma from closest margin: 7.5 cm  to distal margin  Treatment effect: not applicable  Lymph-vascular invasion: present  Perineural invasion: not identified  Tumor deposits (discontinuous extramural extension): not  identified  Pathologic staging:       Primary tumor: pT1       Regional lymph nodes: pN0            Number of nodes examined: 14            Number of nodes involved: 0    10/17/2013 Surgery   He underwent transverse colectomy   10/02/2014 Procedure   He has surveillance colonoscopy. 4 colon polyps were removed   10/02/2014 Pathology Results   [id number] colonoscopy pathology A. CECAL POLYPS; HOT SNARE AND COLD  BIOPSY:  - TUBULAR ADENOMA, MULTIPLE FRAGMENTS.  - NEGATIVE FOR HIGH-GRADE DYSPLASIA AND MALIGNANCY.   B. COLON POLYP, TRANSVERSE; COLD BIOPSY:  - TUBULAR ADENOMA.  - NEGATIVE FOR HIGH-GRADE DYSPLASIA AND MALIGNANCY.     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  Tumor extended into the submucosa. Margins were negative. + lymphovascular invasion. 14 lymph nodes were negative. Pathologic stage was T1 N0.     04/02/2019 Progression   04/02/2019  PET scan  limited evealed a 2.4 x 2.3 cm (SUV 11) hypermetabolic soft tissue density caudal and anterior to the pancreatic neck favored to represent isolated peritoneal or nodal metastasis in the setting of prior transverse colonic resection (expected primary drainage). Although this was immediately adjacent to the pancreas, a fat plane was maintained, arguing strongly against a pancreatic primary. Otherwise, there was no evidence of hypermetabolic metastasis. 04/17/2019 EUS on  revealed a normal esophagus, stomach, duodenum, and pancreas.  There was a 2.4 x 2.4 cm irregular mass in the retroperitoneum adjacent to, but not involving the pancreatic neck.  FNA and core needle biopsy were performed.  Pathology revealed adenocarcinoma compatible with a metastatic lesion of colorectal origin.  Tumor cells were positive for CK20 and CDX2 and negative for CK7.  NGS: [employer] on 05/15/2019 revealed + KRASG13D and TP53.  Negative results included BRAF V600E, Her2, NRAS, NTRK, PD-L1 (<1%), and TMB 8.7/Mb (intermediate).  MMR testing from his colon resection on 10/16/2013 was intact with a low probability of MSI-H.   07/09/2019 -  Chemotherapy   He received 11 cycles of FOLFOX chemotherapy and 1 cycle of 5-FU and leucovorin (12/31/2019).  He received Neulasta after cycle #4 and #5 secondary to progressive leukopenia.  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  He received a truncated course of FOLFOX with cycle #11 secondary to oxaliplatin reaction.   01/14/2020 Imaging    PET revealed an interval decrease in size and FDG uptake (2.4 x 2.3 cm with SUV 11 to 2.4 x 1.7 cm with SUV 4.09) associated with the previously referenced soft tissue density caudal and anterior to the pancreatic neck suggesting treatment response. There were no new sites of FDG avid tumor.   08/31/2020 Imaging   08/31/2020 Abdomen and pelvis CT revealed increased size of the index soft tissue lesion inferior and anterior to the pancreatic neck (2.9 x 1.8 cm to 3.5 x 2.9 cm). There were similar prominent retroperitoneal lymph nodes without adenopathy by size criteria. There was no new or enlarging abdominal or pelvic lymph nodes. There were no new interval findings. There was similar circumferential wall thickening of a nondistended urinary bladder, which likely accentuated wall thickening There was hepatic steatosis and aortic atherosclerosis.   11/03/2020, PET showed recurrent peritoneal metastasis in the upper abdomen adjacent to the pancreas.  The lesion is 3.8 x 2.9 cm with SUV of 11.3. No evidence of metastatic peritoneal disease elsewhere in the abdomen pelvis.  No evidence of distant metastasis.  11/03/2020 Imaging   PET scan showed recurrent peritoneal metastasis near pancreas. Given that he has no other distant metastasis. Discussed with radiation oncology. Repeat SBRT may be considered if he does not respond well to systemic chemotherapy.     11/26/2020 -  Chemotherapy   5-FU and bevacizumab.   10/26/2021 Cancer Staging   Staging form: Colon and Rectum, [name], [name], cM1) - Signed by [name], MD on 10/26/2021 Stage prefix: Recurrence Total positive nodes: 0   02/17/2022 Imaging   02/17/2021 CT abdomen pelvis showed partial response. Mild decrease of peritoneal lesion.  Proceed with 5-FU  and bevacizumab today.  Given that he is tolerating current regimen with good life quality, partial response.  Shared decision was made to hold off adding additional chemotherapy agents.   06/08/2021, CT chest abdomen pelvis with contrast showed soft tissue mass at the base of mesentery inferior to the pancreatic neck is unchanged in size.  No progressive disease was identified.   09/07/2021, CT chest abdomen pelvis with contrast showed no substantial changes in size of the mesenteric mass, 2.6 cm.  No suspicious new lesions.  Chronic findings as detailed in the imaging report.     Other medical problems Chronic lower extremity edema. 12/24/2018, right lower extremity duplex negative for DVT.  Small right Baker's cyst. 08/16/2019, bilateral lower extremity duplex showed no DVT. 08/16/2019 - 08/17/2019 with right lower extremity cellulitis.  He was unable to bear weight.  He was treated with IV fluids, NSAIDs, colchicine, and broad antibiotics (vancomycin and Cefepime).  He was discharged on indomethacin x 5 days and Keflex 500 mg TID x 5 days.  10/05/2020, colonoscopy showed internal hemorrhoids.  Otherwise normal examination.  INTERVAL HISTORY Jared Gutierrez is a 81 y.o. male who has above history reviewed by me today presents for follow up visit for management of recurrent metastatic colon cancer Patient was accompanied by wife.   Denies fever, chills, nausea, vomiting, diarrhea, chest pain, shortness of breath, abdominal pain, urinary symptoms.  + constipation   Review of Systems  Constitutional:  Negative for appetite change, chills, diaphoresis, fever and unexpected weight change.  HENT:   Negative for hearing loss, nosebleeds, sore throat and tinnitus.   Respiratory:  Negative for cough, hemoptysis and shortness of breath.   Cardiovascular:  Negative for chest pain and palpitations.  Gastrointestinal:  Negative for abdominal pain, blood in stool, constipation, diarrhea, nausea and  vomiting.  Genitourinary:  Negative for dysuria, frequency and hematuria.   Musculoskeletal:  Positive for arthralgias. Negative for back pain, myalgias and neck pain.  Skin:  Negative for itching and rash.  Neurological:  Positive for numbness. Negative for dizziness and headaches.  Hematological:  Does not bruise/bleed easily.  Psychiatric/Behavioral:  Negative for depression. The patient is not nervous/anxious.       Past Medical History:  Diagnosis Date   Arthritis    OSTEOARTHRITIS   Cancer ([name])    Cavitary lesion of lung    RIGHT LOWER LOBE   Chicken pox    Colon cancer ([place])    History of kidney stones    Hypertension    Lipoma of colon    Nephrolithiasis    Nephrolithiasis    Obesity    Shingles    Tubular adenoma of colon    multiple fragments    Past Surgical History:  Procedure Laterality Date   COLON SURGERY     COLONOSCOPY N/A 10/02/2014   Procedure: COLONOSCOPY;  Surgeon: Josefine Class, MD;  Location: [facility] ENDOSCOPY;  Service: Endoscopy;  Laterality: N/A;   COLONOSCOPY WITH PROPOFOL N/A 02/16/2017   Procedure: COLONOSCOPY WITH PROPOFOL;  Surgeon: Manya Silvas, MD;  Location: [facility] ENDOSCOPY;  Service: Endoscopy;  Laterality: N/A;   COLONOSCOPY WITH PROPOFOL N/A 10/05/2020   Procedure: COLONOSCOPY WITH PROPOFOL;  Surgeon: Lesly Rubenstein, MD;  Location: [facility] ENDOSCOPY;  Service: Endoscopy;  Laterality: N/A;   EUS N/A 04/17/2019   Procedure: FULL UPPER ENDOSCOPIC ULTRASOUND (EUS) RADIAL;  Surgeon: Holly Bodily, MD;  Location: [facility] ENDOSCOPY;  Service: Gastroenterology;  Laterality: N/A;   KIDNEY STONE SURGERY     PARTIAL COLECTOMY  10/17/2013   PORTACATH PLACEMENT Right 06/13/2019   Procedure: INSERTION PORT-A-CATH;  Surgeon: Benjamine Sprague, DO;  Location: [facility] ORS;  Service: General;  Laterality: Right;    Family History  Problem Relation Age of Onset   Cancer Mother    Breast cancer Mother    COPD Father     Social History:  reports  that he has never smoked. He has never used smokeless tobacco. He reports current alcohol use. He reports that he does not use drugs.    Allergies: No Known Allergies  Current Medications: Current Outpatient Medications  Medication Sig Dispense Refill   acetaminophen (TYLENOL) 500 MG tablet Take 500 mg by mouth every 6 (six) hours as needed.     amLODipine (NORVASC) 2.5 MG tablet Take 2.5 mg by mouth daily.     aspirin EC 81 MG tablet Take 81 mg by mouth daily.     Calcium Carbonate (CALCIUM 600 PO) Take 1 tablet by mouth 2 (two) times daily.     Cholecalciferol (VITAMIN D) 125 MCG (5000 UT) CAPS Take 5,000 mg by mouth.     docusate sodium (COLACE) 100 MG capsule Take 1 capsule (100 mg total) by mouth 2 (two) times daily as needed for mild constipation or moderate constipation. 60 capsule 3   gabapentin (NEURONTIN) 100 MG capsule Take 1 capsule (100 mg total) by mouth 3 (three) times daily. 90 capsule 0   HYDROcodone-acetaminophen (NORCO/VICODIN) 5-325 MG tablet Take 1 tablet by mouth every 6 (six) hours as needed for moderate pain (up to 3 doses for moderate pain.).     KLOR-CON M20 20 MEQ tablet TAKE 1 TABLET BY MOUTH TWICE A DAY 60 tablet 0   lidocaine-prilocaine (EMLA) cream Apply to affected area once 30 g 3   loperamide (IMODIUM) 2 MG capsule Take 1 capsule (2 mg total) by mouth See admin instructions. Take 2 tablets after first loose stool,  then 1 tablet  after each loose stool; maximum: 8 tablets /day 60 capsule 0   loratadine (CLARITIN) 10 MG tablet Take 10 mg by mouth daily.     olmesartan (BENICAR) 20 MG tablet Take 1 tablet by mouth daily.     ondansetron (ZOFRAN) 8 MG tablet Take 1 tablet (8 mg total) by mouth 2 (two) times daily as needed for refractory nausea / vomiting. Start on day 3 after chemotherapy. 60 tablet 1   Probiotic Product (PROBIOTIC DAILY PO) Take 1 tablet by mouth daily.     prochlorperazine (COMPAZINE) 10 MG tablet Take 1 tablet (10 mg total) by mouth every 6  (six) hours as needed (NAUSEA). 30 tablet 1   No current facility-administered medications for this visit.   Facility-Administered Medications Ordered in Other Visits  Medication Dose Route Frequency Provider Last Rate Last Admin   0.9 %  sodium chloride infusion  Intravenous Once Nolon Stalls C, MD       0.9 %  sodium chloride infusion   Intravenous Continuous Lequita Asal, MD 10 mL/hr at 12/31/19 1000 New Bag at 10/05/20 1045   fluorouracil (ADRUCIL) 5,700 mg in sodium chloride 0.9 % 136 mL chemo infusion  2,400 mg/m2 (Order-Specific) Intravenous 1 day or 1 dose [name], MD   Infusion Verify at 11/22/21 1309   heparin lock flush 100 unit/mL  500 Units Intravenous Once Lequita Asal, MD        Performance status (ECOG): 1  Vitals Blood pressure 109/65, pulse 69, temperature (!) 97.4 F (36.3 C), temperature source Tympanic, weight 244 lb (110.7 kg).   Physical Exam Vitals and nursing note reviewed.  Constitutional:      General: He is not in acute distress.    Appearance: He is well-developed. He is obese. He is not diaphoretic.     Interventions: Face mask in place.  HENT:     Head: Normocephalic and atraumatic.     Mouth/Throat:     Mouth: Mucous membranes are moist.     Pharynx: Oropharynx is clear.  Eyes:     General: No scleral icterus.    Extraocular Movements: Extraocular movements intact.     Conjunctiva/sclera: Conjunctivae normal.     Pupils: Pupils are equal, round, and reactive to light.  Cardiovascular:     Rate and Rhythm: Normal rate and regular rhythm.     Heart sounds: Normal heart sounds. No murmur heard. Pulmonary:     Effort: Pulmonary effort is normal. No respiratory distress.     Breath sounds: No wheezing or rales.     Comments: Decreased breath sound bilaterally Chest:     Chest wall: No tenderness.  Abdominal:     General: Bowel sounds are normal. There is no distension.     Palpations: Abdomen is soft. There is no mass.      Tenderness: There is no abdominal tenderness. There is no guarding or rebound.  Musculoskeletal:        General: No swelling or tenderness. Normal range of motion.     Cervical back: Normal range of motion and neck supple.     Right lower leg: Edema present.     Left lower leg: Edema present.  Lymphadenopathy:     Head:     Right side of head: No preauricular, posterior auricular or occipital adenopathy.     Left side of head: No preauricular, posterior auricular or occipital adenopathy.     Cervical: No cervical adenopathy.     Upper Body:     Right upper body: No supraclavicular or axillary adenopathy.     Left upper body: No supraclavicular or axillary adenopathy.     Lower Body: No right inguinal adenopathy. No left inguinal adenopathy.  Skin:    General: Skin is warm and dry.  Neurological:     General: No focal deficit present.     Mental Status: He is alert and oriented to person, place, and time. Mental status is at baseline.  Psychiatric:        Mood and Affect: Mood normal.    Labs were reviewed by me.     Latest Ref Rng & Units 11/22/2021    8:55 AM 11/08/2021    8:42 AM 10/26/2021    8:18 AM  CBC  WBC 4.0 - 10.5 K/uL 5.2  4.9  4.5   Hemoglobin 13.0 - 17.0 g/dL 12.1  12.1  12.7   Hematocrit 39.0 - 52.0 % 36.7  36.7  38.2   Platelets 150 - 400 K/uL 159  165  167       Latest Ref Rng & Units 11/22/2021    8:55 AM 11/08/2021    8:42 AM 10/26/2021    8:18 AM  CMP  Glucose 70 - 99 mg/dL 130  93  164   BUN 8 - 23 mg/dL 11  11  14    Creatinine 0.61 - 1.24 mg/dL 0.67  0.78  0.88   Sodium 135 - 145 mmol/L 136  135  136   Potassium 3.5 - 5.1 mmol/L 3.9  4.1  3.9   Chloride 98 - 111 mmol/L 105  105  103   CO2 22 - 32 mmol/L 25  24  24    Calcium 8.9 - 10.3 mg/dL 9.3  9.3  9.5   Total Protein 6.5 - 8.1 g/dL 6.3  6.6  6.6   Total Bilirubin 0.3 - 1.2 mg/dL 1.2  0.8  1.1   Alkaline Phos 38 - 126 U/L 37  37  40   AST 15 - 41 U/L 50  36  45   ALT 0 - 44 U/L 31  21  28

## 2021-11-22 NOTE — Patient Instructions (Addendum)
[facility] CANCER CTR AT [place]  Discharge Instructions: Thank you for choosing [facility] to provide your oncology and hematology care.  If you have a lab appointment with the [place], please go directly to the [place] and check in at the registration area.  Wear comfortable clothing and clothing appropriate for easy access to any Portacath or PICC line.   We strive to give you quality time with your provider. You may need to reschedule your appointment if you arrive late (15 or more minutes).  Arriving late affects you and other patients whose appointments are after yours.  Also, if you miss three or more appointments without notifying the office, you may be dismissed from the clinic at the provider's discretion.      For prescription refill requests, have your pharmacy contact our office and allow 72 hours for refills to be completed.    Today you received the following chemotherapy and/or immunotherapy agents Mvasi, Leucovorin and Adrucil.     To help prevent nausea and vomiting after your treatment, we encourage you to take your nausea medication as directed.  BELOW ARE SYMPTOMS THAT SHOULD BE REPORTED IMMEDIATELY: *FEVER GREATER THAN 100.4 F (38 C) OR HIGHER *CHILLS OR SWEATING *NAUSEA AND VOMITING THAT IS NOT CONTROLLED WITH YOUR NAUSEA MEDICATION *UNUSUAL SHORTNESS OF BREATH *UNUSUAL BRUISING OR BLEEDING *URINARY PROBLEMS (pain or burning when urinating, or frequent urination) *BOWEL PROBLEMS (unusual diarrhea, constipation, pain near the anus) TENDERNESS IN MOUTH AND THROAT WITH OR WITHOUT PRESENCE OF ULCERS (sore throat, sores in mouth, or a toothache) UNUSUAL RASH, SWELLING OR PAIN  UNUSUAL VAGINAL DISCHARGE OR ITCHING   Items with * indicate a potential emergency and should be followed up as soon as possible or go to the Emergency Department if any problems should occur.  Please show the CHEMOTHERAPY ALERT CARD or IMMUNOTHERAPY ALERT  CARD at check-in to the Emergency Department and triage nurse.  Should you have questions after your visit or need to cancel or reschedule your appointment, please contact [facility] CANCER [place] AT [place]  [phone] and follow the prompts.  Office hours are 8:00 a.m. to 4:30 p.m. Monday - Friday. Please note that voicemails left after 4:00 p.m. may not be returned until the following business day.  We are closed weekends and major holidays. You have access to a nurse at all times for urgent questions. Please call the main number to the clinic [phone] and follow the prompts.  For any non-urgent questions, you may also contact your provider using MyChart. We now offer e-Visits for anyone 81 and older to request care online for non-urgent symptoms. For details visit [email or web address].   Also download the MyChart app! Go to the app store, search "MyChart", open the app, select [place], and log in with your MyChart username and password.  Masks are optional in the cancer centers. If you would like for your care team to wear a mask while they are taking care of you, please let them know. For doctor visits, patients may have with them one support person who is at least 81 years old. At this time, visitors are not allowed in the infusion area.

## 2021-11-23 LAB — CEA: CEA: 11.9 ng/mL — ABNORMAL HIGH (ref 0.0–4.7)

## 2021-11-24 ENCOUNTER — Encounter: Payer: Self-pay | Admitting: Oncology

## 2021-11-24 ENCOUNTER — Inpatient Hospital Stay: Payer: Medicare Other

## 2021-11-24 VITALS — BP 143/79 | HR 51 | Resp 20

## 2021-11-24 DIAGNOSIS — Z5112 Encounter for antineoplastic immunotherapy: Secondary | ICD-10-CM | POA: Diagnosis not present

## 2021-11-24 DIAGNOSIS — C786 Secondary malignant neoplasm of retroperitoneum and peritoneum: Secondary | ICD-10-CM

## 2021-11-24 DIAGNOSIS — Z85038 Personal history of other malignant neoplasm of large intestine: Secondary | ICD-10-CM

## 2021-11-24 MED ORDER — SODIUM CHLORIDE 0.9% FLUSH
10.0000 mL | INTRAVENOUS | Status: DC | PRN
  Administered 2021-11-24: 10 mL
  Filled 2021-11-24: qty 10

## 2021-11-24 MED ORDER — HEPARIN SOD (PORK) LOCK FLUSH 100 UNIT/ML IV SOLN
500.0000 [IU] | Freq: Once | INTRAVENOUS | Status: AC | PRN
  Administered 2021-11-24: 500 [IU]
  Filled 2021-11-24: qty 5

## 2021-12-05 ENCOUNTER — Other Ambulatory Visit: Payer: Self-pay

## 2021-12-05 ENCOUNTER — Ambulatory Visit
Admission: RE | Admit: 2021-12-05 | Discharge: 2021-12-05 | Disposition: A | Discharge status: Home / Self Care | Payer: Medicare Other | Source: Ambulatory Visit | Attending: Oncology | Admitting: Oncology

## 2021-12-05 DIAGNOSIS — C184 Malignant neoplasm of transverse colon: Secondary | ICD-10-CM | POA: Diagnosis not present

## 2021-12-05 DIAGNOSIS — J439 Emphysema, unspecified: Secondary | ICD-10-CM | POA: Insufficient documentation

## 2021-12-05 DIAGNOSIS — I700 Atherosclerosis of aorta: Secondary | ICD-10-CM | POA: Diagnosis not present

## 2021-12-05 LAB — GLUCOSE, CAPILLARY: Glucose-Capillary: 80 mg/dL (ref 70–99)

## 2021-12-05 MED ORDER — FLUDEOXYGLUCOSE F - 18 (FDG) INJECTION
12.6000 | Freq: Once | INTRAVENOUS | Status: AC | PRN
  Administered 2021-12-05: 13.44 via INTRAVENOUS

## 2021-12-06 ENCOUNTER — Inpatient Hospital Stay (HOSPITAL_BASED_OUTPATIENT_CLINIC_OR_DEPARTMENT_OTHER): Payer: Medicare Other | Admitting: Oncology

## 2021-12-06 ENCOUNTER — Inpatient Hospital Stay: Payer: Medicare Other

## 2021-12-06 ENCOUNTER — Encounter: Payer: Self-pay | Admitting: Oncology

## 2021-12-06 DIAGNOSIS — T451X5A Adverse effect of antineoplastic and immunosuppressive drugs, initial encounter: Secondary | ICD-10-CM

## 2021-12-06 DIAGNOSIS — Z5112 Encounter for antineoplastic immunotherapy: Secondary | ICD-10-CM | POA: Diagnosis not present

## 2021-12-06 DIAGNOSIS — G620 Drug-induced polyneuropathy: Secondary | ICD-10-CM | POA: Diagnosis not present

## 2021-12-06 DIAGNOSIS — C786 Secondary malignant neoplasm of retroperitoneum and peritoneum: Secondary | ICD-10-CM

## 2021-12-06 DIAGNOSIS — Z5111 Encounter for antineoplastic chemotherapy: Secondary | ICD-10-CM

## 2021-12-06 DIAGNOSIS — C184 Malignant neoplasm of transverse colon: Secondary | ICD-10-CM

## 2021-12-06 DIAGNOSIS — E876 Hypokalemia: Secondary | ICD-10-CM

## 2021-12-06 DIAGNOSIS — D6481 Anemia due to antineoplastic chemotherapy: Secondary | ICD-10-CM

## 2021-12-06 DIAGNOSIS — Z85038 Personal history of other malignant neoplasm of large intestine: Secondary | ICD-10-CM

## 2021-12-06 LAB — CBC WITH DIFFERENTIAL/PLATELET
Abs Immature Granulocytes: 0.01 10*3/uL (ref 0.00–0.07)
Basophils Absolute: 0.1 10*3/uL (ref 0.0–0.1)
Basophils Relative: 1 %
Eosinophils Absolute: 0.1 10*3/uL (ref 0.0–0.5)
Eosinophils Relative: 2 %
HCT: 35.5 % — ABNORMAL LOW (ref 39.0–52.0)
Hemoglobin: 11.8 g/dL — ABNORMAL LOW (ref 13.0–17.0)
Immature Granulocytes: 0 %
Lymphocytes Relative: 31 %
Lymphs Abs: 1.5 10*3/uL (ref 0.7–4.0)
MCH: 31.9 pg (ref 26.0–34.0)
MCHC: 33.2 g/dL (ref 30.0–36.0)
MCV: 95.9 fL (ref 80.0–100.0)
Monocytes Absolute: 0.4 10*3/uL (ref 0.1–1.0)
Monocytes Relative: 8 %
Neutro Abs: 2.9 10*3/uL (ref 1.7–7.7)
Neutrophils Relative %: 58 %
Platelets: 160 10*3/uL (ref 150–400)
RBC: 3.7 MIL/uL — ABNORMAL LOW (ref 4.22–5.81)
RDW: 14.4 % (ref 11.5–15.5)
WBC: 5 10*3/uL (ref 4.0–10.5)
nRBC: 0 % (ref 0.0–0.2)

## 2021-12-06 LAB — COMPREHENSIVE METABOLIC PANEL
ALT: 21 U/L (ref 0–44)
AST: 35 U/L (ref 15–41)
Albumin: 3.5 g/dL (ref 3.5–5.0)
Alkaline Phosphatase: 34 U/L — ABNORMAL LOW (ref 38–126)
Anion gap: 4 — ABNORMAL LOW (ref 5–15)
BUN: 11 mg/dL (ref 8–23)
CO2: 24 mmol/L (ref 22–32)
Calcium: 9 mg/dL (ref 8.9–10.3)
Chloride: 107 mmol/L (ref 98–111)
Creatinine, Ser: 0.67 mg/dL (ref 0.61–1.24)
GFR, Estimated: >60 mL/min (ref >60)
Glucose, Bld: 142 mg/dL — ABNORMAL HIGH (ref 70–99)
Potassium: 3.2 mmol/L — ABNORMAL LOW (ref 3.5–5.1)
Sodium: 135 mmol/L (ref 135–145)
Total Bilirubin: 1.2 mg/dL (ref 0.3–1.2)
Total Protein: 6.5 g/dL (ref 6.5–8.1)

## 2021-12-06 LAB — PROTEIN, URINE, RANDOM: Total Protein, Urine: 7 mg/dL

## 2021-12-06 MED ORDER — SODIUM CHLORIDE 0.9 % IV SOLN
10.0000 mg | Freq: Once | INTRAVENOUS | Status: AC
  Administered 2021-12-06: 10 mg via INTRAVENOUS
  Filled 2021-12-06: qty 1

## 2021-12-06 MED ORDER — SODIUM CHLORIDE 0.9 % IV SOLN
5.0000 mg/kg | Freq: Once | INTRAVENOUS | Status: AC
  Administered 2021-12-06: 600 mg via INTRAVENOUS
  Filled 2021-12-06: qty 16

## 2021-12-06 MED ORDER — FLUOROURACIL CHEMO INJECTION 2.5 GM/50ML
950.0000 mg | Freq: Once | INTRAVENOUS | Status: AC
  Administered 2021-12-06: 950 mg via INTRAVENOUS
  Filled 2021-12-06: qty 19

## 2021-12-06 MED ORDER — SODIUM CHLORIDE 0.9 % IV SOLN
5700.0000 mg | INTRAVENOUS | Status: DC
  Administered 2021-12-06: 5700 mg via INTRAVENOUS
  Filled 2021-12-06: qty 114

## 2021-12-06 MED ORDER — POTASSIUM CHLORIDE 20 MEQ/100ML IV SOLN
20.0000 meq | Freq: Once | INTRAVENOUS | Status: AC
  Administered 2021-12-06: 20 meq via INTRAVENOUS

## 2021-12-06 MED ORDER — SODIUM CHLORIDE 0.9 % IV SOLN
400.0000 mg/m2 | Freq: Once | INTRAVENOUS | Status: AC
  Administered 2021-12-06: 948 mg via INTRAVENOUS
  Filled 2021-12-06: qty 47.4

## 2021-12-06 MED ORDER — SODIUM CHLORIDE 0.9 % IV SOLN
Freq: Once | INTRAVENOUS | Status: AC
  Filled 2021-12-06: qty 250

## 2021-12-06 NOTE — Assessment & Plan Note (Signed)
Patient has gabapentin supply at home.Patient declines gabapentin and acupuncture.  

## 2021-12-06 NOTE — Assessment & Plan Note (Signed)
Chemotherapy plan as listed above 

## 2021-12-06 NOTE — Progress Notes (Signed)
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   CEA is relatively stable with small fluctuations. Labs reviewed and discussed with patient Proceed with 5-FU/bevacizumab treatment today PET scan showed unchanged size of the soft tissue mass with decreased activity.  Compatible with treatment response.  Encounter for antineoplastic chemotherapy Chemotherapy plan as listed above.   Anemia due to chemotherapy continue to monitor.  Hemoglobin is close to baseline.  Chemotherapy-induced neuropathy ([place]) Patient has gabapentin supply at home.Patient declines gabapentin and acupuncture.  No orders of the defined types were placed in this encounter.   Follow-up 2 weeks lab Jared Gutierrez 5-FU/bevacizumab -   All questions were answered. The patient knows to call the clinic with any problems, questions or concerns.  Jared Gutierrez, Jared Gutierrez, Jared Gutierrez 12/06/2021   Chief Complaint: Jared Gutierrez is a 81 y.o. male presents for follow-up of metastatic colon cancer   PERTINENT Gutierrez HISTORY Jared Gutierrez is a 81 y.o.[place] who has above Gutierrez history reviewed by me today presented for follow up visit for management of Metastatic colon cancer. Patient previously followed up by Jared Gutierrez, patient switched care to me on 11/11/20 Extensive medical record review was performed by me  Gutierrez History  History of colon cancer, stage I  09/23/2013 Pathology Results   Part A: TRANSVERSE COLON POLYP HOT SNARE:  - TUBULOVILLOUS ADENOMA, SEE COMMENT.  - NEGATIVE FOR HIGH GRADE DYSPLASIA AND MALIGNANCY.  .  Part B: SPLENIC FLEXURE MASS COLD BIOPSY:  - SUPERFICIAL SAMPLING OF A TUBULOVILLOUS ADENOMA WITH AT LEAST  HIGH GRADE DYSPLASIA, SEE COMMENT.    09/23/2013 Initial Diagnosis   His initial cancer was discovered through  screening colonoscopy   10/17/2013 Pathology Results   TRANSVERSE COLON, RESECTION:  - INVASIVE ADENOCARCINOMA ARISING IN A 4.6 CM TUBULOVILLOUS  ADENOMA WITH HIGH GRADE DYSPLASIA (MALIGNANT POLYP).  - SEE SUMMARY BELOW.  [name]  Gutierrez SUMMARY: COLON AND RECTUM, RESECTION, AJCC 7TH EDITION  Specimen: transverse colon  Procedure: transverse colon resection  Tumor site: splenic flexure  Tumor size: 1.0 cm (invasive component)  Macroscopic tumor perforation: not specified  Histologic type: invasive adenocarcinoma  Histologic grade: moderately differentiated  Microscopic tumor extension: into submucosa  Margins:       Proximal margin: negative       Distal margin: negative       Circumferential (radial) or mesenteric margin: negative       If all margins uninvolved by invasive carcinoma:       Distance of invasive carcinoma from closest margin: 7.5 cm  to distal margin  Treatment effect: not applicable  Lymph-vascular invasion: present  Perineural invasion: not identified  Tumor deposits (discontinuous extramural extension): not  identified  Pathologic staging:       Primary tumor: pT1       Regional lymph nodes: pN0            Number of nodes examined: 14            Number of nodes involved: 0    10/17/2013 Surgery   He underwent transverse colectomy   10/02/2014 Procedure   He has surveillance colonoscopy. 4 colon polyps were removed   10/02/2014 Pathology Results   [id number] colonoscopy pathology A. CECAL POLYPS; HOT SNARE AND COLD BIOPSY:  - TUBULAR ADENOMA, MULTIPLE FRAGMENTS.  - NEGATIVE FOR HIGH-GRADE DYSPLASIA  AND MALIGNANCY.   B. COLON POLYP, TRANSVERSE; COLD BIOPSY:  - TUBULAR ADENOMA.  - NEGATIVE FOR HIGH-GRADE DYSPLASIA AND MALIGNANCY.     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)  10/17/2013 Initial Diagnosis   stage I colon cancer s/p transverse colectomy- Pathology revealed a 1 cm moderately differentiated invasive adenocarcinoma arising in a 4.6 cm tubulovillous adenoma with high-grade dysplasia.  Tumor extended into the submucosa. Margins were negative. + lymphovascular invasion. 14 lymph nodes were negative. Pathologic stage was T1 N0.     04/02/2019 Progression   04/02/2019  PET scan  limited evealed a 2.4 x 2.3 cm (SUV 11) hypermetabolic soft tissue density caudal and anterior to the pancreatic neck favored to represent isolated peritoneal or nodal metastasis in the setting of prior transverse colonic resection (expected primary drainage). Although this was immediately adjacent to the pancreas, a fat plane was maintained, arguing strongly against a pancreatic primary. Otherwise, there was no evidence of hypermetabolic metastasis. 04/17/2019 EUS on  revealed a normal esophagus, stomach, duodenum, and pancreas.  There was a 2.4 x 2.4 cm irregular mass in the retroperitoneum adjacent to, but not involving the pancreatic neck.  FNA and core needle biopsy were performed.  Pathology revealed adenocarcinoma compatible with a metastatic lesion of colorectal origin.  Tumor cells were positive for CK20 and CDX2 and negative for CK7.  NGS: [employer] on 05/15/2019 revealed + KRASG13D and TP53.  Negative results included BRAF V600E, Her2, NRAS, NTRK, PD-L1 (<1%), and TMB 8.7/Mb (intermediate).  MMR testing from his colon resection on 10/16/2013 was intact with a low probability of MSI-H.   07/09/2019 -  Chemotherapy   He received 11 cycles of FOLFOX chemotherapy and 1 cycle of 5-FU and leucovorin (12/31/2019).  He received Neulasta after cycle #4 and #5 secondary to progressive leukopenia.  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  He received  a truncated course of FOLFOX with cycle #11 secondary to oxaliplatin reaction.   01/14/2020 Imaging    PET revealed an interval decrease in size and FDG uptake (2.4 x 2.3 cm with SUV 11 to 2.4 x 1.7 cm with SUV 4.09) associated with the previously referenced soft tissue density caudal and anterior to the pancreatic neck suggesting treatment response. There were no new sites of FDG avid tumor.   08/31/2020 Imaging   08/31/2020 Abdomen and pelvis CT revealed increased size of the index soft tissue lesion inferior and anterior to the pancreatic neck (2.9 x 1.8 cm to 3.5 x 2.9 cm). There were similar prominent retroperitoneal lymph nodes without adenopathy by size criteria. There was no new or enlarging abdominal or pelvic lymph nodes. There were no new interval findings. There was similar circumferential wall thickening of a nondistended urinary bladder, which likely accentuated wall thickening There was hepatic steatosis and aortic atherosclerosis.   11/03/2020, PET showed recurrent peritoneal metastasis in the upper abdomen adjacent to the pancreas.  The lesion is 3.8 x 2.9 cm with SUV of 11.3. No evidence of metastatic peritoneal disease elsewhere in the abdomen pelvis.  No evidence of distant metastasis.   11/03/2020 Imaging   PET scan showed recurrent peritoneal metastasis near pancreas. Given  that he has no other distant metastasis. Discussed with radiation Gutierrez. Repeat SBRT may be considered if he does not respond well to systemic chemotherapy.     11/26/2020 -  Chemotherapy   5-FU and bevacizumab.   10/26/2021 Cancer Staging   Staging form: Colon and Rectum, [name], [name], cM1) - Signed by Jared Gutierrez, Jared Gutierrez on 10/26/2021 Stage prefix: Recurrence Total positive nodes: 0   12/05/2021 Imaging   PET scan showed soft tissue mesenteric mass is unchanged in size.  Decreased FDG uptake compared to activity on November 03, 2020.  Fever treated disease.  No new  metastatic disease.   02/17/2022 Imaging   02/17/2021 CT abdomen pelvis showed partial response. Mild decrease of peritoneal lesion.  Proceed with 5-FU and bevacizumab today.  Given that he is tolerating current regimen with good life quality, partial response.  Shared decision was made to hold off adding additional chemotherapy agents.   06/08/2021, CT chest abdomen pelvis with contrast showed soft tissue mass at the base of mesentery inferior to the pancreatic neck is unchanged in size.  No progressive disease was identified.   09/07/2021, CT chest abdomen pelvis with contrast showed no substantial changes in size of the mesenteric mass, 2.6 cm.  No suspicious new lesions.  Chronic findings as detailed in the imaging report.     Other medical problems Chronic lower extremity edema. 12/24/2018, right lower extremity duplex negative for DVT.  Small right Baker's cyst. 08/16/2019, bilateral lower extremity duplex showed no DVT. 08/16/2019 - 08/17/2019 with right lower extremity cellulitis.  He was unable to bear weight.  He was treated with IV fluids, NSAIDs, colchicine, and broad antibiotics (vancomycin and Cefepime).  He was discharged on indomethacin x 5 days and Keflex 500 mg TID x 5 days.  10/05/2020, colonoscopy showed internal hemorrhoids.  Otherwise normal examination.  INTERVAL HISTORY Jared Gutierrez is a 81 y.o. male who has above history reviewed by me today presents for follow up visit for management of recurrent metastatic colon cancer Patient was accompanied by wife.   Denies fever, chills, nausea, vomiting, diarrhea, chest pain, shortness of breath, abdominal pain, urinary symptoms.  For constipation, he takes stool softener as needed.  Recently he reports that his stool is pudding-like.  Occasionally when he wipes he see very small amount of blood on the toilet paper.  No other new complaints.   Review of Systems  Constitutional:  Negative for appetite change, chills,  diaphoresis, fever and unexpected weight change.  HENT:   Negative for hearing loss, nosebleeds, sore throat and tinnitus.   Respiratory:  Negative for cough, hemoptysis and shortness of breath.   Cardiovascular:  Negative for chest pain and palpitations.  Gastrointestinal:  Negative for abdominal pain, blood in stool, constipation, diarrhea, nausea and vomiting.  Genitourinary:  Negative for dysuria, frequency and hematuria.   Musculoskeletal:  Positive for arthralgias. Negative for back pain, myalgias and neck pain.  Skin:  Negative for itching and rash.  Neurological:  Positive for numbness. Negative for dizziness and headaches.  Hematological:  Does not bruise/bleed easily.  Psychiatric/Behavioral:  Negative for depression. The patient is not nervous/anxious.       Past Medical History:  Diagnosis Date   Arthritis    OSTEOARTHRITIS   Cancer ([place])    Cavitary lesion of lung    RIGHT LOWER LOBE   Chicken pox    Colon cancer ([facility])    History of kidney stones  Hypertension    Lipoma of colon    Nephrolithiasis    Nephrolithiasis    Obesity    Shingles    Tubular adenoma of colon    multiple fragments    Past Surgical History:  Procedure Laterality Date   COLON SURGERY     COLONOSCOPY N/A 10/02/2014   Procedure: COLONOSCOPY;  Surgeon: Josefine Class, Jared Gutierrez;  Location: [facility] ENDOSCOPY;  Service: Endoscopy;  Laterality: N/A;   COLONOSCOPY WITH PROPOFOL N/A 02/16/2017   Procedure: COLONOSCOPY WITH PROPOFOL;  Surgeon: Manya Silvas, Jared Gutierrez;  Location: [facility] ENDOSCOPY;  Service: Endoscopy;  Laterality: N/A;   COLONOSCOPY WITH PROPOFOL N/A 10/05/2020   Procedure: COLONOSCOPY WITH PROPOFOL;  Surgeon: Lesly Rubenstein, Jared Gutierrez;  Location: [facility] ENDOSCOPY;  Service: Endoscopy;  Laterality: N/A;   EUS N/A 04/17/2019   Procedure: FULL UPPER ENDOSCOPIC ULTRASOUND (EUS) RADIAL;  Surgeon: Holly Bodily, Jared Gutierrez;  Location: [facility] ENDOSCOPY;  Service: Gastroenterology;  Laterality: N/A;    KIDNEY STONE SURGERY     PARTIAL COLECTOMY  10/17/2013   PORTACATH PLACEMENT Right 06/13/2019   Procedure: INSERTION PORT-A-CATH;  Surgeon: Benjamine Sprague, DO;  Location: [facility] ORS;  Service: General;  Laterality: Right;    Family History  Problem Relation Age of Onset   Cancer Mother    Breast cancer Mother    COPD Father     Social History:  reports that he has never smoked. He has never used smokeless tobacco. He reports current alcohol use. He reports that he does not use drugs.    Allergies: No Known Allergies  Current Medications: Current Outpatient Medications  Medication Sig Dispense Refill   acetaminophen (TYLENOL) 500 MG tablet Take 500 mg by mouth every 6 (six) hours as needed.     amLODipine (NORVASC) 2.5 MG tablet Take 2.5 mg by mouth daily.     aspirin EC 81 MG tablet Take 81 mg by mouth daily.     Calcium Carbonate (CALCIUM 600 PO) Take 1 tablet by mouth 2 (two) times daily.     Cholecalciferol (VITAMIN D) 125 MCG (5000 UT) CAPS Take 5,000 mg by mouth.     docusate sodium (COLACE) 100 MG capsule Take 1 capsule (100 mg total) by mouth 2 (two) times daily as needed for mild constipation or moderate constipation. 60 capsule 3   gabapentin (NEURONTIN) 100 MG capsule Take 1 capsule (100 mg total) by mouth 3 (three) times daily. 90 capsule 0   HYDROcodone-acetaminophen (NORCO/VICODIN) 5-325 MG tablet Take 1 tablet by mouth every 6 (six) hours as needed for moderate pain (up to 3 doses for moderate pain.).     KLOR-CON M20 20 MEQ tablet TAKE 1 TABLET BY MOUTH TWICE A DAY 60 tablet 0   lidocaine-prilocaine (EMLA) cream Apply to affected area once 30 g 3   loperamide (IMODIUM) 2 MG capsule Take 1 capsule (2 mg total) by mouth See admin instructions. Take 2 tablets after first loose stool,  then 1 tablet  after each loose stool; maximum: 8 tablets /day 60 capsule 0   loratadine (CLARITIN) 10 MG tablet Take 10 mg by mouth daily.     olmesartan (BENICAR) 20 MG tablet Take 1 tablet by  mouth daily.     ondansetron (ZOFRAN) 8 MG tablet Take 1 tablet (8 mg total) by mouth 2 (two) times daily as needed for refractory nausea / vomiting. Start on day 3 after chemotherapy. 60 tablet 1   Probiotic Product (PROBIOTIC DAILY PO) Take 1 tablet by mouth daily.  prochlorperazine (COMPAZINE) 10 MG tablet Take 1 tablet (10 mg total) by mouth every 6 (six) hours as needed (NAUSEA). 30 tablet 1   No current facility-administered medications for this visit.   Facility-Administered Medications Ordered in Other Visits  Medication Dose Route Frequency Provider Last Rate Last Admin   0.9 %  sodium chloride infusion   Intravenous Once Corcoran, Melissa C, Jared Gutierrez       0.9 %  sodium chloride infusion   Intravenous Continuous Lequita Asal, Jared Gutierrez 10 mL/hr at 12/31/19 1000 New Bag at 10/05/20 1045   fluorouracil (ADRUCIL) 5,700 mg in sodium chloride 0.9 % 136 mL chemo infusion  5,700 mg Intravenous 1 day or 1 dose Jared Gutierrez, Jared Gutierrez   5,700 mg at 12/06/21 1220   heparin lock flush 100 unit/mL  500 Units Intravenous Once Lequita Asal, Jared Gutierrez        Performance status (ECOG): 1  Vitals Blood pressure 113/70, pulse 70, temperature (!) 96.3 F (35.7 C), temperature source Tympanic, weight 244 lb (110.7 kg).   Physical Exam Vitals and nursing note reviewed.  Constitutional:      General: He is not in acute distress.    Appearance: He is well-developed. He is obese. He is not diaphoretic.     Interventions: Face mask in place.  HENT:     Head: Normocephalic and atraumatic.     Mouth/Throat:     Mouth: Mucous membranes are moist.     Pharynx: Oropharynx is clear.  Eyes:     General: No scleral icterus.    Extraocular Movements: Extraocular movements intact.     Conjunctiva/sclera: Conjunctivae normal.     Pupils: Pupils are equal, round, and reactive to light.  Cardiovascular:     Rate and Rhythm: Normal rate and regular rhythm.     Heart sounds: Normal heart sounds. No murmur  heard. Pulmonary:     Effort: Pulmonary effort is normal. No respiratory distress.     Breath sounds: No wheezing or rales.     Comments: Decreased breath sound bilaterally Chest:     Chest wall: No tenderness.  Abdominal:     General: Bowel sounds are normal. There is no distension.     Palpations: Abdomen is soft. There is no mass.     Tenderness: There is no abdominal tenderness. There is no guarding or rebound.  Musculoskeletal:        General: No swelling or tenderness. Normal range of motion.     Cervical back: Normal range of motion and neck supple.     Right lower leg: Edema present.     Left lower leg: Edema present.  Lymphadenopathy:     Head:     Right side of head: No preauricular, posterior auricular or occipital adenopathy.     Left side of head: No preauricular, posterior auricular or occipital adenopathy.     Cervical: No cervical adenopathy.     Upper Body:     Right upper body: No supraclavicular or axillary adenopathy.     Left upper body: No supraclavicular or axillary adenopathy.     Lower Body: No right inguinal adenopathy. No left inguinal adenopathy.  Skin:    General: Skin is warm and dry.  Neurological:     General: No focal deficit present.     Mental Status: He is alert and oriented to person, place, and time. Mental status is at baseline.  Psychiatric:        Mood and Affect: Mood normal.  Labs were reviewed by me.     Latest Ref Rng & Units 12/06/2021    8:38 AM 11/22/2021    8:55 AM 11/08/2021    8:42 AM  CBC  WBC 4.0 - 10.5 K/uL 5.0  5.2  4.9   Hemoglobin 13.0 - 17.0 g/dL 11.8  12.1  12.1   Hematocrit 39.0 - 52.0 % 35.5  36.7  36.7   Platelets 150 - 400 K/uL 160  159  165       Latest Ref Rng & Units 12/06/2021    8:38 AM 11/22/2021    8:55 AM 11/08/2021    8:42 AM  CMP  Glucose 70 - 99 mg/dL 142  130  93   BUN 8 - 23 mg/dL _0 Creatinine 0.61 - 1.24 mg/dL 0.67  0.67  0.78   Sodium 135 - 145 mmol/L 135  136  135   Potassium  3.5 - 5.1 mmol/L 3.2  3.9  4.1   Chloride 98 - 111 mmol/L 107  105  105   CO2 22 - 32 mmol/L _1 Calcium 8.9 - 10.3 mg/dL 9.0  9.3  9.3   Total Protein 6.5 - 8.1 g/dL 6.5  6.3  6.6   Total Bilirubin 0.3 - 1.2 mg/dL 1.2  1.2  0.8   Alkaline Phos 38 - 126 U/L 34  37  37   AST 15 - 41 U/L 35  50  36   ALT 0 - 44 U/L 21  31  21

## 2021-12-06 NOTE — Assessment & Plan Note (Signed)
Overall he tolerates chemotherapy.   CEA is relatively stable with small fluctuations. Labs reviewed and discussed with patient Proceed with 5-FU/bevacizumab treatment today PET scan showed unchanged size of the soft tissue mass with decreased activity.  Compatible with treatment response.

## 2021-12-06 NOTE — Assessment & Plan Note (Signed)
continue to monitor.  Hemoglobin is close to baseline.

## 2021-12-06 NOTE — Patient Instructions (Signed)
[facility]  Discharge Instructions: Thank you for choosing [facility] to provide your oncology and hematology care.   If you have a lab appointment with the Cancer Center, please go directly to the Cancer Center and check in at the registration area.   Wear comfortable clothing and clothing appropriate for easy access to any Portacath or PICC line.   We strive to give you quality time with your provider. You may need to reschedule your appointment if you arrive late (15 or more minutes).  Arriving late affects you and other patients whose appointments are after yours.  Also, if you miss three or more appointments without notifying the office, you may be dismissed from the clinic at the provider's discretion.      For prescription refill requests, have your pharmacy contact our office and allow 72 hours for refills to be completed.    Today you received the following chemotherapy and/or immunotherapy agents       To help prevent nausea and vomiting after your treatment, we encourage you to take your nausea medication as directed.  BELOW ARE SYMPTOMS THAT SHOULD BE REPORTED IMMEDIATELY: *FEVER GREATER THAN 100.4 F (38 C) OR HIGHER *CHILLS OR SWEATING *NAUSEA AND VOMITING THAT IS NOT CONTROLLED WITH YOUR NAUSEA MEDICATION *UNUSUAL SHORTNESS OF BREATH *UNUSUAL BRUISING OR BLEEDING *URINARY PROBLEMS (pain or burning when urinating, or frequent urination) *BOWEL PROBLEMS (unusual diarrhea, constipation, pain near the anus) TENDERNESS IN MOUTH AND THROAT WITH OR WITHOUT PRESENCE OF ULCERS (sore throat, sores in mouth, or a toothache) UNUSUAL RASH, SWELLING OR PAIN  UNUSUAL VAGINAL DISCHARGE OR ITCHING   Items with * indicate a potential emergency and should be followed up as soon as possible or go to the Emergency Department if any problems should occur.  Please show the CHEMOTHERAPY ALERT CARD or IMMUNOTHERAPY ALERT CARD at check-in to the  Emergency Department and triage nurse.  Should you have questions after your visit or need to cancel or reschedule your appointment, please contact [facility]  Dept: [phone]  and follow the prompts.  Office hours are 8:00 a.m. to 4:30 p.m. Monday - Friday. Please note that voicemails left after 4:00 p.m. may not be returned until the following business day.  We are closed weekends and major holidays. You have access to a nurse at all times for urgent questions. Please call the main number to the clinic Dept: [phone] and follow the prompts.   For any non-urgent questions, you may also contact your provider using MyChart. We now offer e-Visits for anyone 18 and older to request care online for non-urgent symptoms. For details visit [email or web address].   Also download the MyChart app! Go to the app store, search "MyChart", open the app, select [facility], and log in with your MyChart username and password.  Masks are optional in the cancer centers. If you would like for your care team to wear a mask while they are taking care of you, please let them know. For doctor visits, patients may have with them one support person who is at least 81 years old. At this time, visitors are not allowed in the infusion area. 

## 2021-12-07 ENCOUNTER — Other Ambulatory Visit: Payer: Self-pay

## 2021-12-08 ENCOUNTER — Inpatient Hospital Stay: Payer: Medicare Other

## 2021-12-08 DIAGNOSIS — Z85038 Personal history of other malignant neoplasm of large intestine: Secondary | ICD-10-CM

## 2021-12-08 DIAGNOSIS — C786 Secondary malignant neoplasm of retroperitoneum and peritoneum: Secondary | ICD-10-CM

## 2021-12-08 DIAGNOSIS — Z5112 Encounter for antineoplastic immunotherapy: Secondary | ICD-10-CM | POA: Diagnosis not present

## 2021-12-08 MED ORDER — SODIUM CHLORIDE 0.9% FLUSH
10.0000 mL | INTRAVENOUS | Status: DC | PRN
  Administered 2021-12-08: 10 mL
  Filled 2021-12-08: qty 10

## 2021-12-08 MED ORDER — HEPARIN SOD (PORK) LOCK FLUSH 100 UNIT/ML IV SOLN
500.0000 [IU] | Freq: Once | INTRAVENOUS | Status: AC | PRN
  Administered 2021-12-08: 500 [IU]
  Filled 2021-12-08: qty 5

## 2021-12-13 ENCOUNTER — Other Ambulatory Visit: Payer: Self-pay

## 2021-12-19 ENCOUNTER — Other Ambulatory Visit: Payer: Self-pay | Admitting: Oncology

## 2021-12-19 MED FILL — Dexamethasone Sodium Phosphate Inj 100 MG/10ML: INTRAMUSCULAR | Qty: 1 | Status: AC

## 2021-12-20 ENCOUNTER — Inpatient Hospital Stay: Payer: Medicare Other | Attending: Oncology

## 2021-12-20 ENCOUNTER — Inpatient Hospital Stay: Payer: Medicare Other

## 2021-12-20 ENCOUNTER — Encounter: Payer: Self-pay | Admitting: Oncology

## 2021-12-20 ENCOUNTER — Inpatient Hospital Stay (HOSPITAL_BASED_OUTPATIENT_CLINIC_OR_DEPARTMENT_OTHER): Payer: Medicare Other | Admitting: Oncology

## 2021-12-20 VITALS — BP 107/67 | HR 68 | Temp 97.1°F | Resp 18 | Wt 238.1 lb

## 2021-12-20 DIAGNOSIS — Z452 Encounter for adjustment and management of vascular access device: Secondary | ICD-10-CM | POA: Diagnosis not present

## 2021-12-20 DIAGNOSIS — Z836 Family history of other diseases of the respiratory system: Secondary | ICD-10-CM | POA: Insufficient documentation

## 2021-12-20 DIAGNOSIS — Z79899 Other long term (current) drug therapy: Secondary | ICD-10-CM | POA: Diagnosis not present

## 2021-12-20 DIAGNOSIS — Z5111 Encounter for antineoplastic chemotherapy: Secondary | ICD-10-CM

## 2021-12-20 DIAGNOSIS — C786 Secondary malignant neoplasm of retroperitoneum and peritoneum: Secondary | ICD-10-CM | POA: Diagnosis not present

## 2021-12-20 DIAGNOSIS — C184 Malignant neoplasm of transverse colon: Secondary | ICD-10-CM | POA: Diagnosis not present

## 2021-12-20 DIAGNOSIS — Z809 Family history of malignant neoplasm, unspecified: Secondary | ICD-10-CM | POA: Insufficient documentation

## 2021-12-20 DIAGNOSIS — K5900 Constipation, unspecified: Secondary | ICD-10-CM | POA: Diagnosis not present

## 2021-12-20 DIAGNOSIS — T451X5D Adverse effect of antineoplastic and immunosuppressive drugs, subsequent encounter: Secondary | ICD-10-CM | POA: Diagnosis not present

## 2021-12-20 DIAGNOSIS — G620 Drug-induced polyneuropathy: Secondary | ICD-10-CM

## 2021-12-20 DIAGNOSIS — D6481 Anemia due to antineoplastic chemotherapy: Secondary | ICD-10-CM | POA: Insufficient documentation

## 2021-12-20 DIAGNOSIS — Z86010 Personal history of colonic polyps: Secondary | ICD-10-CM | POA: Diagnosis not present

## 2021-12-20 DIAGNOSIS — R972 Elevated prostate specific antigen [PSA]: Secondary | ICD-10-CM | POA: Diagnosis not present

## 2021-12-20 DIAGNOSIS — Z803 Family history of malignant neoplasm of breast: Secondary | ICD-10-CM | POA: Diagnosis not present

## 2021-12-20 DIAGNOSIS — Z85038 Personal history of other malignant neoplasm of large intestine: Secondary | ICD-10-CM

## 2021-12-20 DIAGNOSIS — E669 Obesity, unspecified: Secondary | ICD-10-CM | POA: Diagnosis not present

## 2021-12-20 DIAGNOSIS — Z5112 Encounter for antineoplastic immunotherapy: Secondary | ICD-10-CM | POA: Diagnosis not present

## 2021-12-20 DIAGNOSIS — R634 Abnormal weight loss: Secondary | ICD-10-CM | POA: Diagnosis not present

## 2021-12-20 DIAGNOSIS — T451X5A Adverse effect of antineoplastic and immunosuppressive drugs, initial encounter: Secondary | ICD-10-CM

## 2021-12-20 LAB — COMPREHENSIVE METABOLIC PANEL
ALT: 24 U/L (ref 0–44)
AST: 39 U/L (ref 15–41)
Albumin: 3.5 g/dL (ref 3.5–5.0)
Alkaline Phosphatase: 36 U/L — ABNORMAL LOW (ref 38–126)
Anion gap: 8 (ref 5–15)
BUN: 11 mg/dL (ref 8–23)
CO2: 24 mmol/L (ref 22–32)
Calcium: 9.7 mg/dL (ref 8.9–10.3)
Chloride: 105 mmol/L (ref 98–111)
Creatinine, Ser: 0.82 mg/dL (ref 0.61–1.24)
GFR, Estimated: >60 mL/min (ref >60)
Glucose, Bld: 109 mg/dL — ABNORMAL HIGH (ref 70–99)
Potassium: 4 mmol/L (ref 3.5–5.1)
Sodium: 137 mmol/L (ref 135–145)
Total Bilirubin: 0.9 mg/dL (ref 0.3–1.2)
Total Protein: 6.3 g/dL — ABNORMAL LOW (ref 6.5–8.1)

## 2021-12-20 LAB — CBC WITH DIFFERENTIAL/PLATELET
Abs Immature Granulocytes: 0.05 10*3/uL (ref 0.00–0.07)
Basophils Absolute: 0.1 10*3/uL (ref 0.0–0.1)
Basophils Relative: 1 %
Eosinophils Absolute: 0.1 10*3/uL (ref 0.0–0.5)
Eosinophils Relative: 2 %
HCT: 35.9 % — ABNORMAL LOW (ref 39.0–52.0)
Hemoglobin: 11.9 g/dL — ABNORMAL LOW (ref 13.0–17.0)
Immature Granulocytes: 1 %
Lymphocytes Relative: 31 %
Lymphs Abs: 1.4 10*3/uL (ref 0.7–4.0)
MCH: 32 pg (ref 26.0–34.0)
MCHC: 33.1 g/dL (ref 30.0–36.0)
MCV: 96.5 fL (ref 80.0–100.0)
Monocytes Absolute: 0.5 10*3/uL (ref 0.1–1.0)
Monocytes Relative: 11 %
Neutro Abs: 2.5 10*3/uL (ref 1.7–7.7)
Neutrophils Relative %: 54 %
Platelets: 164 10*3/uL (ref 150–400)
RBC: 3.72 MIL/uL — ABNORMAL LOW (ref 4.22–5.81)
RDW: 14.6 % (ref 11.5–15.5)
WBC: 4.6 10*3/uL (ref 4.0–10.5)
nRBC: 0 % (ref 0.0–0.2)

## 2021-12-20 LAB — PROTEIN, URINE, RANDOM: Total Protein, Urine: 9 mg/dL

## 2021-12-20 MED ORDER — SODIUM CHLORIDE 0.9 % IV SOLN
2400.0000 mg/m2 | INTRAVENOUS | Status: DC
  Administered 2021-12-20: 5500 mg via INTRAVENOUS
  Filled 2021-12-20: qty 110

## 2021-12-20 MED ORDER — SODIUM CHLORIDE 0.9 % IV SOLN
Freq: Once | INTRAVENOUS | Status: AC
  Filled 2021-12-20: qty 250

## 2021-12-20 MED ORDER — SODIUM CHLORIDE 0.9% FLUSH
10.0000 mL | INTRAVENOUS | Status: DC | PRN
  Administered 2021-12-20: 10 mL via INTRAVENOUS
  Filled 2021-12-20: qty 10

## 2021-12-20 MED ORDER — SODIUM CHLORIDE 0.9% FLUSH
10.0000 mL | INTRAVENOUS | Status: DC | PRN
  Filled 2021-12-20: qty 10

## 2021-12-20 MED ORDER — FLUOROURACIL CHEMO INJECTION 2.5 GM/50ML
400.0000 mg/m2 | Freq: Once | INTRAVENOUS | Status: AC
  Administered 2021-12-20: 900 mg via INTRAVENOUS
  Filled 2021-12-20: qty 18

## 2021-12-20 MED ORDER — SODIUM CHLORIDE 0.9 % IV SOLN
10.0000 mg | Freq: Once | INTRAVENOUS | Status: AC
  Administered 2021-12-20: 10 mg via INTRAVENOUS
  Filled 2021-12-20: qty 10

## 2021-12-20 MED ORDER — SODIUM CHLORIDE 0.9 % IV SOLN
5.0000 mg/kg | Freq: Once | INTRAVENOUS | Status: AC
  Administered 2021-12-20: 600 mg via INTRAVENOUS
  Filled 2021-12-20: qty 16

## 2021-12-20 MED ORDER — SODIUM CHLORIDE 0.9 % IV SOLN
400.0000 mg/m2 | Freq: Once | INTRAVENOUS | Status: AC
  Administered 2021-12-20: 948 mg via INTRAVENOUS
  Filled 2021-12-20: qty 47.4

## 2021-12-20 NOTE — Assessment & Plan Note (Signed)
continue to monitor.  Hemoglobin is close to baseline.

## 2021-12-20 NOTE — Assessment & Plan Note (Signed)
Patient has gabapentin supply at home.Patient declines gabapentin and acupuncture.  

## 2021-12-20 NOTE — Assessment & Plan Note (Signed)
Overall he tolerates chemotherapy.   CEA is relatively stable with small fluctuations. Labs reviewed and discussed with patient Proceed with 5-FU/bevacizumab treatment today PET scan images were reviewed by me and discussed with patient.  Stable size and decreased hypermetabolic activity. Continue current regimen.

## 2021-12-20 NOTE — Assessment & Plan Note (Signed)
Chemotherapy plan as listed above 

## 2021-12-20 NOTE — Progress Notes (Signed)
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   CEA is relatively stable with small fluctuations. Labs reviewed and discussed with patient Proceed with 5-FU/bevacizumab treatment today PET scan images were reviewed by me and discussed with patient.  Stable size and decreased hypermetabolic activity. Continue current regimen.  Encounter for antineoplastic chemotherapy Chemotherapy plan as listed above.   Chemotherapy-induced neuropathy ([place]) Patient has gabapentin supply at home.Patient declines gabapentin and acupuncture.   Anemia due to chemotherapy continue to monitor.  Hemoglobin is close to baseline.  Weight loss, refer to nutritionist     Orders Placed This Encounter  Procedures   Ambulatory Referral to [facility] Nutrition    Referral Priority:   Routine    Referral Type:   Consultation    Referral Reason:   Specialty Services Required    Number of Visits Requested:   1     Follow-up 2 weeks lab MD 5-FU/bevacizumab -   All questions were answered. The patient knows to call the clinic with any problems, questions or concerns.  [name], MD, PhD [facility] Hematology Oncology 12/20/2021   Chief Complaint: Jared Gutierrez is a 81 y.o. male presents for follow-up of metastatic colon cancer   PERTINENT ONCOLOGY HISTORY Jared Gutierrez is a 81 y.o.[place] who has above oncology history reviewed by me today presented for follow up visit for management of Metastatic colon cancer. [name], patient switched care to me on 11/11/20 Extensive medical record review was performed by me  Oncology History   History of colon cancer, stage I  09/23/2013 Pathology Results   Part A: TRANSVERSE COLON POLYP HOT SNARE:  - TUBULOVILLOUS ADENOMA, SEE COMMENT.  - NEGATIVE FOR HIGH GRADE DYSPLASIA AND MALIGNANCY.  .  Part B: SPLENIC FLEXURE MASS COLD BIOPSY:  - SUPERFICIAL SAMPLING OF A TUBULOVILLOUS ADENOMA WITH AT LEAST  HIGH GRADE DYSPLASIA, SEE COMMENT.    09/23/2013 Initial Diagnosis   His initial cancer was discovered through screening colonoscopy   10/17/2013 Pathology Results   TRANSVERSE COLON, RESECTION:  - INVASIVE ADENOCARCINOMA ARISING IN A 4.6 CM TUBULOVILLOUS  ADENOMA WITH HIGH GRADE DYSPLASIA (MALIGNANT POLYP).  - SEE SUMMARY BELOW.  [name]  ONCOLOGY SUMMARY: COLON AND RECTUM, RESECTION, AJCC 7TH EDITION  Specimen: transverse colon  Procedure: transverse colon resection  Tumor site: splenic flexure  Tumor size: 1.0 cm (invasive component)  Macroscopic tumor perforation: not specified  Histologic type: invasive adenocarcinoma  Histologic grade: moderately differentiated  Microscopic tumor extension: into submucosa  Margins:       Proximal margin: negative       Distal margin: negative       Circumferential (radial) or mesenteric margin: negative       If all margins uninvolved by invasive carcinoma:       Distance of invasive carcinoma from closest margin: 7.5 cm  to distal margin  Treatment effect: not applicable  Lymph-vascular invasion: present  Perineural invasion: not identified  Tumor deposits (discontinuous extramural extension): not  identified  Pathologic staging:       Primary tumor: pT1       Regional lymph nodes: pN0  Number of nodes examined: 14            Number of nodes involved: 0    10/17/2013 Surgery   He underwent transverse colectomy   10/02/2014 Procedure   He has surveillance colonoscopy. 4 colon polyps were removed   10/02/2014 Pathology Results   [id number] colonoscopy pathology A. CECAL POLYPS; HOT SNARE AND COLD BIOPSY:  - TUBULAR  ADENOMA, MULTIPLE FRAGMENTS.  - NEGATIVE FOR HIGH-GRADE DYSPLASIA AND MALIGNANCY.   B. COLON POLYP, TRANSVERSE; COLD BIOPSY:  - TUBULAR ADENOMA.  - NEGATIVE FOR HIGH-GRADE DYSPLASIA AND MALIGNANCY.     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  Tumor extended into the submucosa. Margins were negative. + lymphovascular invasion. 14 lymph nodes were negative. Pathologic stage was T1 N0.     04/02/2019 Progression   04/02/2019  PET scan  limited evealed a 2.4 x 2.3 cm (SUV 11) hypermetabolic soft tissue density caudal and anterior to the pancreatic neck favored to represent isolated peritoneal or nodal metastasis in the setting of prior transverse colonic resection (expected primary drainage). Although this was immediately adjacent to the pancreas, a fat plane was maintained, arguing strongly against a pancreatic primary. Otherwise, there was no evidence of hypermetabolic metastasis. 04/17/2019 EUS on  revealed a normal esophagus, stomach, duodenum, and pancreas.  There was a 2.4 x 2.4 cm irregular mass in the retroperitoneum adjacent to, but not involving the pancreatic neck.  FNA and core needle biopsy were performed.  Pathology revealed adenocarcinoma compatible with a metastatic lesion of colorectal origin.  Tumor cells were positive for CK20 and CDX2 and negative for CK7.  NGS: [employer] on 05/15/2019 revealed + KRASG13D and TP53.  Negative results included BRAF V600E, Her2,  NRAS, NTRK, PD-L1 (<1%), and TMB 8.7/Mb (intermediate).  MMR testing from his colon resection on 10/16/2013 was intact with a low probability of MSI-H.   07/09/2019 -  Chemotherapy   He received 11 cycles of FOLFOX chemotherapy and 1 cycle of 5-FU and leucovorin (12/31/2019).  He received Neulasta after cycle #4 and #5 secondary to progressive leukopenia.  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  He received a truncated course of FOLFOX with cycle #11 secondary to oxaliplatin reaction.   01/14/2020 Imaging    PET revealed an interval decrease in size and FDG uptake (2.4 x 2.3 cm with SUV 11 to 2.4 x 1.7 cm with SUV 4.09) associated with the previously referenced soft tissue density caudal and anterior to the pancreatic neck suggesting treatment response. There were no new sites of FDG avid tumor.   08/31/2020 Imaging   08/31/2020 Abdomen and pelvis CT revealed increased size of the index soft tissue lesion inferior and anterior to the pancreatic neck (2.9 x 1.8 cm to 3.5 x 2.9 cm). There were similar prominent retroperitoneal lymph nodes without adenopathy by size criteria. There was no new or enlarging abdominal or pelvic lymph nodes. There were no new interval findings. There was similar circumferential wall thickening  of a nondistended urinary bladder, which likely accentuated wall thickening There was hepatic steatosis and aortic atherosclerosis.   11/03/2020, PET showed recurrent peritoneal metastasis in the upper abdomen adjacent to the pancreas.  The lesion is 3.8 x 2.9 cm with SUV of 11.3. No evidence of metastatic peritoneal disease elsewhere in the abdomen pelvis.  No evidence of distant metastasis.   11/03/2020 Imaging   PET scan showed recurrent peritoneal metastasis near pancreas. Given that he has no other distant metastasis. Discussed with radiation oncology. Repeat SBRT may be considered if he does not respond well to systemic chemotherapy.     11/26/2020 -  Chemotherapy    5-FU and bevacizumab.   10/26/2021 Cancer Staging   Staging form: Colon and Rectum, [name], [name], cM1) - Signed by [name], MD on 10/26/2021 Stage prefix: Recurrence Total positive nodes: 0   12/05/2021 Imaging   PET scan showed soft tissue mesenteric mass is unchanged in size.  Decreased FDG uptake compared to activity on November 03, 2020.  Fever treated disease.  No new metastatic disease.   12/06/2021 Imaging   PET scan showed 1. Soft tissue mesenteric mass is unchanged in size when compared with most recent prior and demonstrates decreased FDG uptake when compared with PET-CT dated November 03, 2020, favor treated disease.2. No new metastatic disease seen in the chest, abdomen or pelvis   02/17/2022 Imaging   02/17/2021 CT abdomen pelvis showed partial response. Mild decrease of peritoneal lesion.  Proceed with 5-FU and bevacizumab today.  Given that he is tolerating current regimen with good life quality, partial response.  Shared decision was made to hold off adding additional chemotherapy agents.   06/08/2021, CT chest abdomen pelvis with contrast showed soft tissue mass at the base of mesentery inferior to the pancreatic neck is unchanged in size.  No progressive disease was identified.   09/07/2021, CT chest abdomen pelvis with contrast showed no substantial changes in size of the mesenteric mass, 2.6 cm.  No suspicious new lesions.  Chronic findings as detailed in the imaging report.     Other medical problems Chronic lower extremity edema. 12/24/2018, right lower extremity duplex negative for DVT.  Small right Baker's cyst. 08/16/2019, bilateral lower extremity duplex showed no DVT. 08/16/2019 - 08/17/2019 with right lower extremity cellulitis.  He was unable to bear weight.  He was treated with IV fluids, NSAIDs, colchicine, and broad antibiotics (vancomycin and Cefepime).  He was discharged on indomethacin x 5 days and Keflex 500 mg TID x 5  days.  10/05/2020, colonoscopy showed internal hemorrhoids.  Otherwise normal examination.  INTERVAL HISTORY Jared Gutierrez is a 81 y.o. male who has above history reviewed by me today presents for follow up visit for management of recurrent metastatic colon cancer Patient was accompanied by wife.   Denies fever, chills, nausea, vomiting, diarrhea, chest pain, shortness of breath, abdominal pain, urinary symptoms.  For constipation, he takes stool softener and symptom has improved.  No blood in stool recently. He has lost weight.  Reports eating less compared to prior.  No other new complaints.   Review of Systems  Constitutional:  Negative for appetite change, chills, diaphoresis, fever and unexpected weight change.  HENT:   Negative for hearing loss, nosebleeds, sore throat and tinnitus.   Respiratory:  Negative for cough, hemoptysis and shortness of breath.   Cardiovascular:  Negative for chest pain and palpitations.  Gastrointestinal:  Negative for abdominal pain, blood in stool,  constipation, diarrhea, nausea and vomiting.  Genitourinary:  Negative for dysuria, frequency and hematuria.   Musculoskeletal:  Positive for arthralgias. Negative for back pain, myalgias and neck pain.  Skin:  Negative for itching and rash.  Neurological:  Positive for numbness. Negative for dizziness and headaches.  Hematological:  Does not bruise/bleed easily.  Psychiatric/Behavioral:  Negative for depression. The patient is not nervous/anxious.       Past Medical History:  Diagnosis Date   Arthritis    OSTEOARTHRITIS   Cancer ([place])    Cavitary lesion of lung    RIGHT LOWER LOBE   Chicken pox    Colon cancer (H[facility])    History of kidney stones    Hypertension    Lipoma of colon    Nephrolithiasis    Nephrolithiasis    Obesity    Shingles    Tubular adenoma of colon    multiple fragments    Past Surgical History:  Procedure Laterality Date   COLON SURGERY     COLONOSCOPY N/A 10/02/2014    Procedure: COLONOSCOPY;  Surgeon: Josefine Class, MD;  Location: [facility] ENDOSCOPY;  Service: Endoscopy;  Laterality: N/A;   COLONOSCOPY WITH PROPOFOL N/A 02/16/2017   Procedure: COLONOSCOPY WITH PROPOFOL;  Surgeon: Manya Silvas, MD;  Location: [facility] ENDOSCOPY;  Service: Endoscopy;  Laterality: N/A;   COLONOSCOPY WITH PROPOFOL N/A 10/05/2020   Procedure: COLONOSCOPY WITH PROPOFOL;  Surgeon: Lesly Rubenstein, MD;  Location: [facility] ENDOSCOPY;  Service: Endoscopy;  Laterality: N/A;   EUS N/A 04/17/2019   Procedure: FULL UPPER ENDOSCOPIC ULTRASOUND (EUS) RADIAL;  Surgeon: Holly Bodily, MD;  Location: [facility] ENDOSCOPY;  Service: Gastroenterology;  Laterality: N/A;   KIDNEY STONE SURGERY     PARTIAL COLECTOMY  10/17/2013   PORTACATH PLACEMENT Right 06/13/2019   Procedure: INSERTION PORT-A-CATH;  Surgeon: Benjamine Sprague, DO;  Location: [facility] ORS;  Service: General;  Laterality: Right;    Family History  Problem Relation Age of Onset   Cancer Mother    Breast cancer Mother    COPD Father     Social History:  reports that he has never smoked. He has never used smokeless tobacco. He reports current alcohol use. He reports that he does not use drugs.    Allergies: No Known Allergies  Current Medications: Current Outpatient Medications  Medication Sig Dispense Refill   acetaminophen (TYLENOL) 500 MG tablet Take 500 mg by mouth every 6 (six) hours as needed.     amLODipine (NORVASC) 2.5 MG tablet Take 2.5 mg by mouth daily.     aspirin EC 81 MG tablet Take 81 mg by mouth daily.     Calcium Carbonate (CALCIUM 600 PO) Take 1 tablet by mouth 2 (two) times daily.     Cholecalciferol (VITAMIN D) 125 MCG (5000 UT) CAPS Take 5,000 mg by mouth.     docusate sodium (COLACE) 100 MG capsule Take 1 capsule (100 mg total) by mouth 2 (two) times daily as needed for mild constipation or moderate constipation. 60 capsule 3   gabapentin (NEURONTIN) 100 MG capsule Take 1 capsule (100 mg total) by mouth 3  (three) times daily. 90 capsule 0   HYDROcodone-acetaminophen (NORCO/VICODIN) 5-325 MG tablet Take 1 tablet by mouth every 6 (six) hours as needed for moderate pain (up to 3 doses for moderate pain.).     KLOR-CON M20 20 MEQ tablet TAKE 1 TABLET BY MOUTH TWICE A DAY 60 tablet 0   lidocaine-prilocaine (EMLA) cream Apply to affected area once 30  g 3   loratadine (CLARITIN) 10 MG tablet Take 10 mg by mouth daily.     olmesartan (BENICAR) 20 MG tablet Take 1 tablet by mouth daily.     ondansetron (ZOFRAN) 8 MG tablet Take 1 tablet (8 mg total) by mouth 2 (two) times daily as needed for refractory nausea / vomiting. Start on day 3 after chemotherapy. 60 tablet 1   Probiotic Product (PROBIOTIC DAILY PO) Take 1 tablet by mouth daily.     prochlorperazine (COMPAZINE) 10 MG tablet Take 1 tablet (10 mg total) by mouth every 6 (six) hours as needed (NAUSEA). 30 tablet 1   loperamide (IMODIUM) 2 MG capsule Take 1 capsule (2 mg total) by mouth See admin instructions. Take 2 tablets after first loose stool,  then 1 tablet  after each loose stool; maximum: 8 tablets /day (Patient not taking: Reported on 12/20/2021) 60 capsule 0   No current facility-administered medications for this visit.   Facility-Administered Medications Ordered in Other Visits  Medication Dose Route Frequency Provider Last Rate Last Admin   0.9 %  sodium chloride infusion   Intravenous Once Corcoran, Melissa C, MD       0.9 %  sodium chloride infusion   Intravenous Continuous Mike Gip, Melissa C, MD 10 mL/hr at 12/31/19 1000 New Bag at 10/05/20 1045   heparin lock flush 100 unit/mL  500 Units Intravenous Once Lequita Asal, MD        Performance status (ECOG): 1  Vitals Blood pressure 107/67, pulse 68, temperature (!) 97.1 F (36.2 C), resp. rate 18, weight 238 lb 1.6 oz (108 kg).   Physical Exam Vitals and nursing note reviewed.  Constitutional:      General: He is not in acute distress.    Appearance: He is well-developed.  He is obese. He is not diaphoretic.     Interventions: Face mask in place.  HENT:     Head: Normocephalic and atraumatic.     Mouth/Throat:     Mouth: Mucous membranes are moist.     Pharynx: Oropharynx is clear.  Eyes:     General: No scleral icterus.    Extraocular Movements: Extraocular movements intact.     Conjunctiva/sclera: Conjunctivae normal.     Pupils: Pupils are equal, round, and reactive to light.  Cardiovascular:     Rate and Rhythm: Normal rate and regular rhythm.     Heart sounds: Normal heart sounds. No murmur heard. Pulmonary:     Effort: Pulmonary effort is normal. No respiratory distress.     Breath sounds: No wheezing or rales.     Comments: Decreased breath sound bilaterally Chest:     Chest wall: No tenderness.  Abdominal:     General: Bowel sounds are normal. There is no distension.     Palpations: Abdomen is soft. There is no mass.     Tenderness: There is no abdominal tenderness. There is no guarding or rebound.  Musculoskeletal:        General: No swelling or tenderness. Normal range of motion.     Cervical back: Normal range of motion and neck supple.     Right lower leg: Edema present.     Left lower leg: Edema present.  Lymphadenopathy:     Head:     Right side of head: No preauricular, posterior auricular or occipital adenopathy.     Left side of head: No preauricular, posterior auricular or occipital adenopathy.     Cervical: No cervical adenopathy.  Upper Body:     Right upper body: No supraclavicular or axillary adenopathy.     Left upper body: No supraclavicular or axillary adenopathy.     Lower Body: No right inguinal adenopathy. No left inguinal adenopathy.  Skin:    General: Skin is warm and dry.  Neurological:     General: No focal deficit present.     Mental Status: He is alert and oriented to person, place, and time. Mental status is at baseline.  Psychiatric:        Mood and Affect: Mood normal.    Labs were reviewed by me.      Latest Ref Rng & Units 12/20/2021    8:52 AM 12/06/2021    8:38 AM 11/22/2021    8:55 AM  CBC  WBC 4.0 - 10.5 K/uL 4.6  5.0  5.2   Hemoglobin 13.0 - 17.0 g/dL 11.9  11.8  12.1   Hematocrit 39.0 - 52.0 % 35.9  35.5  36.7   Platelets 150 - 400 K/uL 164  160  159       Latest Ref Rng & Units 12/20/2021    8:52 AM 12/06/2021    8:38 AM 11/22/2021    8:55 AM  CMP  Glucose 70 - 99 mg/dL 109  142  130   BUN 8 - 23 mg/dL 11  11  11    Creatinine 0.61 - 1.24 mg/dL 0.82  0.67  0.67   Sodium 135 - 145 mmol/L 137  135  136   Potassium 3.5 - 5.1 mmol/L 4.0  3.2  3.9   Chloride 98 - 111 mmol/L 105  107  105   CO2 22 - 32 mmol/L 24  24  25    Calcium 8.9 - 10.3 mg/dL 9.7  9.0  9.3   Total Protein 6.5 - 8.1 g/dL 6.3  6.5  6.3   Total Bilirubin 0.3 - 1.2 mg/dL 0.9  1.2  1.2   Alkaline Phos 38 - 126 U/L 36  34  37   AST 15 - 41 U/L 39  35  50   ALT 0 - 44 U/L 24  21  31

## 2021-12-20 NOTE — Patient Instructions (Signed)
[facility] AT [place]  Discharge Instructions: Thank you for choosing [facility] to provide your oncology and hematology care.  If you have a lab appointment with the [facility], please go directly to the [place] and check in at the registration area.  Wear comfortable clothing and clothing appropriate for easy access to any Portacath or PICC line.   We strive to give you quality time with your provider. You may need to reschedule your appointment if you arrive late (15 or more minutes).  Arriving late affects you and other patients whose appointments are after yours.  Also, if you miss three or more appointments without notifying the office, you may be dismissed from the clinic at the provider's discretion.      For prescription refill requests, have your pharmacy contact our office and allow 72 hours for refills to be completed.    Today you received the following chemotherapy and/or immunotherapy agents: Avastin, adrucil      To help prevent nausea and vomiting after your treatment, we encourage you to take your nausea medication as directed.  BELOW ARE SYMPTOMS THAT SHOULD BE REPORTED IMMEDIATELY: *FEVER GREATER THAN 100.4 F (38 C) OR HIGHER *CHILLS OR SWEATING *NAUSEA AND VOMITING THAT IS NOT CONTROLLED WITH YOUR NAUSEA MEDICATION *UNUSUAL SHORTNESS OF BREATH *UNUSUAL BRUISING OR BLEEDING *URINARY PROBLEMS (pain or burning when urinating, or frequent urination) *BOWEL PROBLEMS (unusual diarrhea, constipation, pain near the anus) TENDERNESS IN MOUTH AND THROAT WITH OR WITHOUT PRESENCE OF ULCERS (sore throat, sores in mouth, or a toothache) UNUSUAL RASH, SWELLING OR PAIN  UNUSUAL VAGINAL DISCHARGE OR ITCHING   Items with * indicate a potential emergency and should be followed up as soon as possible or go to the Emergency Department if any problems should occur.  Please show the CHEMOTHERAPY ALERT CARD or IMMUNOTHERAPY ALERT CARD at  check-in to the Emergency Department and triage nurse.  Should you have questions after your visit or need to cancel or reschedule your appointment, please contact [facility] CANCER [place] AT [place]  [phone] and follow the prompts.  Office hours are 8:00 a.m. to 4:30 p.m. Monday - Friday. Please note that voicemails left after 4:00 p.m. may not be returned until the following business day.  We are closed weekends and major holidays. You have access to a nurse at all times for urgent questions. Please call the main number to the clinic [phone] and follow the prompts.  For any non-urgent questions, you may also contact your provider using MyChart. We now offer e-Visits for anyone 46 and older to request care online for non-urgent symptoms. For details visit [email or web address].   Also download the MyChart app! Go to the app store, search "MyChart", open the app, select [place], and log in with your MyChart username and password.  Masks are optional in the cancer centers. If you would like for your care team to wear a mask while they are taking care of you, please let them know. For doctor visits, patients may have with them one support person who is at least 81 years old. At this time, visitors are not allowed in the infusion area.

## 2021-12-21 ENCOUNTER — Other Ambulatory Visit: Payer: Self-pay

## 2021-12-21 DIAGNOSIS — Z85038 Personal history of other malignant neoplasm of large intestine: Secondary | ICD-10-CM

## 2021-12-21 LAB — CEA: CEA: 12.4 ng/mL — ABNORMAL HIGH (ref 0.0–4.7)

## 2021-12-22 ENCOUNTER — Inpatient Hospital Stay: Payer: Medicare Other

## 2021-12-22 ENCOUNTER — Other Ambulatory Visit: Payer: Self-pay

## 2021-12-22 VITALS — BP 152/74 | HR 70 | Resp 18

## 2021-12-22 DIAGNOSIS — R972 Elevated prostate specific antigen [PSA]: Secondary | ICD-10-CM

## 2021-12-22 DIAGNOSIS — C786 Secondary malignant neoplasm of retroperitoneum and peritoneum: Secondary | ICD-10-CM

## 2021-12-22 DIAGNOSIS — Z125 Encounter for screening for malignant neoplasm of prostate: Secondary | ICD-10-CM

## 2021-12-22 DIAGNOSIS — Z5112 Encounter for antineoplastic immunotherapy: Secondary | ICD-10-CM | POA: Diagnosis not present

## 2021-12-22 DIAGNOSIS — R8290 Unspecified abnormal findings in urine: Secondary | ICD-10-CM

## 2021-12-22 DIAGNOSIS — Z85038 Personal history of other malignant neoplasm of large intestine: Secondary | ICD-10-CM

## 2021-12-22 DIAGNOSIS — Z0000 Encounter for general adult medical examination without abnormal findings: Secondary | ICD-10-CM

## 2021-12-22 DIAGNOSIS — I700 Atherosclerosis of aorta: Secondary | ICD-10-CM

## 2021-12-22 MED ORDER — HEPARIN SOD (PORK) LOCK FLUSH 100 UNIT/ML IV SOLN
500.0000 [IU] | Freq: Once | INTRAVENOUS | Status: AC | PRN
  Administered 2021-12-22: 500 [IU]
  Filled 2021-12-22: qty 5

## 2021-12-22 MED ORDER — SODIUM CHLORIDE 0.9% FLUSH
10.0000 mL | INTRAVENOUS | Status: DC | PRN
  Administered 2021-12-22: 10 mL
  Filled 2021-12-22: qty 10

## 2021-12-23 ENCOUNTER — Inpatient Hospital Stay: Payer: Medicare Other

## 2021-12-23 NOTE — Progress Notes (Signed)
Nutrition  Patient did not show up to nutrition appointment today.  RD planning to see patient during infusion on 8/22.  Deziree Mokry B. Zenia Resides, [place], [place] Registered Dietitian [phone]

## 2021-12-26 ENCOUNTER — Other Ambulatory Visit: Payer: Self-pay | Admitting: Oncology

## 2021-12-26 NOTE — Telephone Encounter (Signed)
Component Ref Range & Units 6 d ago (12/20/21) 2 wk ago (12/06/21) 1 mo ago (11/22/21) 1 mo ago (11/08/21) 2 mo ago (10/26/21) 2 mo ago (10/12/21) 2 mo ago (09/28/21)  Potassium 3.5 - 5.1 mmol/L 4.0  3.2 Low   3.9  4.1  3.9  4.2  3.5

## 2022-01-02 MED FILL — Dexamethasone Sodium Phosphate Inj 100 MG/10ML: INTRAMUSCULAR | Qty: 1 | Status: AC

## 2022-01-03 ENCOUNTER — Inpatient Hospital Stay: Payer: Medicare Other

## 2022-01-03 ENCOUNTER — Inpatient Hospital Stay (HOSPITAL_BASED_OUTPATIENT_CLINIC_OR_DEPARTMENT_OTHER): Payer: Medicare Other | Admitting: Oncology

## 2022-01-03 ENCOUNTER — Encounter: Payer: Self-pay | Admitting: Oncology

## 2022-01-03 VITALS — BP 115/69 | HR 71 | Temp 96.4°F | Resp 20 | Wt 240.8 lb

## 2022-01-03 DIAGNOSIS — I700 Atherosclerosis of aorta: Secondary | ICD-10-CM

## 2022-01-03 DIAGNOSIS — Z85038 Personal history of other malignant neoplasm of large intestine: Secondary | ICD-10-CM

## 2022-01-03 DIAGNOSIS — R972 Elevated prostate specific antigen [PSA]: Secondary | ICD-10-CM

## 2022-01-03 DIAGNOSIS — Z5111 Encounter for antineoplastic chemotherapy: Secondary | ICD-10-CM

## 2022-01-03 DIAGNOSIS — G620 Drug-induced polyneuropathy: Secondary | ICD-10-CM

## 2022-01-03 DIAGNOSIS — R8290 Unspecified abnormal findings in urine: Secondary | ICD-10-CM

## 2022-01-03 DIAGNOSIS — C184 Malignant neoplasm of transverse colon: Secondary | ICD-10-CM

## 2022-01-03 DIAGNOSIS — C786 Secondary malignant neoplasm of retroperitoneum and peritoneum: Secondary | ICD-10-CM

## 2022-01-03 DIAGNOSIS — T451X5A Adverse effect of antineoplastic and immunosuppressive drugs, initial encounter: Secondary | ICD-10-CM

## 2022-01-03 DIAGNOSIS — D6481 Anemia due to antineoplastic chemotherapy: Secondary | ICD-10-CM | POA: Diagnosis not present

## 2022-01-03 DIAGNOSIS — Z5112 Encounter for antineoplastic immunotherapy: Secondary | ICD-10-CM | POA: Diagnosis not present

## 2022-01-03 DIAGNOSIS — Z0000 Encounter for general adult medical examination without abnormal findings: Secondary | ICD-10-CM

## 2022-01-03 DIAGNOSIS — Z125 Encounter for screening for malignant neoplasm of prostate: Secondary | ICD-10-CM

## 2022-01-03 LAB — URINALYSIS, COMPLETE (UACMP) WITH MICROSCOPIC
Bacteria, UA: NONE SEEN
Bilirubin Urine: NEGATIVE
Glucose, UA: NEGATIVE mg/dL
Hgb urine dipstick: NEGATIVE
Ketones, ur: NEGATIVE mg/dL
Leukocytes,Ua: NEGATIVE
Nitrite: NEGATIVE
Protein, ur: NEGATIVE mg/dL
Specific Gravity, Urine: 1.014 (ref 1.005–1.030)
pH: 5 (ref 5.0–8.0)

## 2022-01-03 LAB — COMPREHENSIVE METABOLIC PANEL
ALT: 20 U/L (ref 0–44)
AST: 36 U/L (ref 15–41)
Albumin: 3.4 g/dL — ABNORMAL LOW (ref 3.5–5.0)
Alkaline Phosphatase: 36 U/L — ABNORMAL LOW (ref 38–126)
Anion gap: 6 (ref 5–15)
BUN: 11 mg/dL (ref 8–23)
CO2: 24 mmol/L (ref 22–32)
Calcium: 9.1 mg/dL (ref 8.9–10.3)
Chloride: 106 mmol/L (ref 98–111)
Creatinine, Ser: 0.72 mg/dL (ref 0.61–1.24)
GFR, Estimated: >60 mL/min (ref >60)
Glucose, Bld: 109 mg/dL — ABNORMAL HIGH (ref 70–99)
Potassium: 3.8 mmol/L (ref 3.5–5.1)
Sodium: 136 mmol/L (ref 135–145)
Total Bilirubin: 1.3 mg/dL — ABNORMAL HIGH (ref 0.3–1.2)
Total Protein: 6.3 g/dL — ABNORMAL LOW (ref 6.5–8.1)

## 2022-01-03 LAB — CBC WITH DIFFERENTIAL/PLATELET
Abs Immature Granulocytes: 0.01 10*3/uL (ref 0.00–0.07)
Basophils Absolute: 0 10*3/uL (ref 0.0–0.1)
Basophils Relative: 1 %
Eosinophils Absolute: 0.1 10*3/uL (ref 0.0–0.5)
Eosinophils Relative: 2 %
HCT: 36.7 % — ABNORMAL LOW (ref 39.0–52.0)
Hemoglobin: 12 g/dL — ABNORMAL LOW (ref 13.0–17.0)
Immature Granulocytes: 0 %
Lymphocytes Relative: 32 %
Lymphs Abs: 1.4 10*3/uL (ref 0.7–4.0)
MCH: 31.7 pg (ref 26.0–34.0)
MCHC: 32.7 g/dL (ref 30.0–36.0)
MCV: 97.1 fL (ref 80.0–100.0)
Monocytes Absolute: 0.4 10*3/uL (ref 0.1–1.0)
Monocytes Relative: 9 %
Neutro Abs: 2.4 10*3/uL (ref 1.7–7.7)
Neutrophils Relative %: 56 %
Platelets: 171 10*3/uL (ref 150–400)
RBC: 3.78 MIL/uL — ABNORMAL LOW (ref 4.22–5.81)
RDW: 14.6 % (ref 11.5–15.5)
WBC: 4.3 10*3/uL (ref 4.0–10.5)
nRBC: 0 % (ref 0.0–0.2)

## 2022-01-03 LAB — LIPID PANEL
Cholesterol: 151 mg/dL (ref 0–200)
HDL: 47 mg/dL (ref >40)
LDL Cholesterol: 79 mg/dL (ref 0–99)
Total CHOL/HDL Ratio: 3.2 RATIO
Triglycerides: 125 mg/dL (ref <150)
VLDL: 25 mg/dL (ref 0–40)

## 2022-01-03 LAB — PSA: Prostatic Specific Antigen: 9.59 ng/mL — ABNORMAL HIGH (ref 0.00–4.00)

## 2022-01-03 LAB — PROTEIN, URINE, RANDOM: Total Protein, Urine: 9 mg/dL

## 2022-01-03 LAB — TSH: TSH: 4.339 u[IU]/mL (ref 0.350–4.500)

## 2022-01-03 MED ORDER — SODIUM CHLORIDE 0.9 % IV SOLN
400.0000 mg/m2 | Freq: Once | INTRAVENOUS | Status: AC
  Administered 2022-01-03: 948 mg via INTRAVENOUS
  Filled 2022-01-03: qty 47.4

## 2022-01-03 MED ORDER — SODIUM CHLORIDE 0.9 % IV SOLN
10.0000 mg | Freq: Once | INTRAVENOUS | Status: AC
  Administered 2022-01-03: 10 mg via INTRAVENOUS
  Filled 2022-01-03: qty 1

## 2022-01-03 MED ORDER — SODIUM CHLORIDE 0.9 % IV SOLN
2400.0000 mg/m2 | INTRAVENOUS | Status: DC
  Administered 2022-01-03: 5500 mg via INTRAVENOUS
  Filled 2022-01-03: qty 110

## 2022-01-03 MED ORDER — SODIUM CHLORIDE 0.9 % IV SOLN
Freq: Once | INTRAVENOUS | Status: AC
  Filled 2022-01-03: qty 250

## 2022-01-03 MED ORDER — SODIUM CHLORIDE 0.9 % IV SOLN
5.0000 mg/kg | Freq: Once | INTRAVENOUS | Status: AC
  Administered 2022-01-03: 600 mg via INTRAVENOUS
  Filled 2022-01-03: qty 16

## 2022-01-03 MED ORDER — FLUOROURACIL CHEMO INJECTION 2.5 GM/50ML
400.0000 mg/m2 | Freq: Once | INTRAVENOUS | Status: AC
  Administered 2022-01-03: 900 mg via INTRAVENOUS
  Filled 2022-01-03: qty 18

## 2022-01-03 NOTE — Patient Instructions (Signed)
[facility] CANCER CTR AT [place]  Discharge Instructions: Thank you for choosing [facility] to provide your oncology and hematology care.  If you have a lab appointment with the [facility], please go directly to the [place] and check in at the registration area.  Wear comfortable clothing and clothing appropriate for easy access to any Portacath or PICC line.   We strive to give you quality time with your provider. You may need to reschedule your appointment if you arrive late (15 or more minutes).  Arriving late affects you and other patients whose appointments are after yours.  Also, if you miss three or more appointments without notifying the office, you may be dismissed from the clinic at the provider's discretion.      For prescription refill requests, have your pharmacy contact our office and allow 72 hours for refills to be completed.    Today you received the following chemotherapy and/or immunotherapy agents Bevacizumab, Leucovorin, Adrucil.       To help prevent nausea and vomiting after your treatment, we encourage you to take your nausea medication as directed.  BELOW ARE SYMPTOMS THAT SHOULD BE REPORTED IMMEDIATELY: *FEVER GREATER THAN 100.4 F (38 C) OR HIGHER *CHILLS OR SWEATING *NAUSEA AND VOMITING THAT IS NOT CONTROLLED WITH YOUR NAUSEA MEDICATION *UNUSUAL SHORTNESS OF BREATH *UNUSUAL BRUISING OR BLEEDING *URINARY PROBLEMS (pain or burning when urinating, or frequent urination) *BOWEL PROBLEMS (unusual diarrhea, constipation, pain near the anus) TENDERNESS IN MOUTH AND THROAT WITH OR WITHOUT PRESENCE OF ULCERS (sore throat, sores in mouth, or a toothache) UNUSUAL RASH, SWELLING OR PAIN  UNUSUAL VAGINAL DISCHARGE OR ITCHING   Items with * indicate a potential emergency and should be followed up as soon as possible or go to the Emergency Department if any problems should occur.  Please show the CHEMOTHERAPY ALERT CARD or IMMUNOTHERAPY  ALERT CARD at check-in to the Emergency Department and triage nurse.  Should you have questions after your visit or need to cancel or reschedule your appointment, please contact [facility] CANCER [place] AT [place]  [phone] and follow the prompts.  Office hours are 8:00 a.m. to 4:30 p.m. Monday - Friday. Please note that voicemails left after 4:00 p.m. may not be returned until the following business day.  We are closed weekends and major holidays. You have access to a nurse at all times for urgent questions. Please call the main number to the clinic [phone] and follow the prompts.  For any non-urgent questions, you may also contact your provider using MyChart. We now offer e-Visits for anyone 81 and older to request care online for non-urgent symptoms. For details visit [email or web address].   Also download the MyChart app! Go to the app store, search "MyChart", open the app, select [facility], and log in with your MyChart username and password.  Masks are optional in the cancer centers. If you would like for your care team to wear a mask while they are taking care of you, please let them know. For doctor visits, patients may have with them one support person who is at least 81 years old. At this time, visitors are not allowed in the infusion area.

## 2022-01-03 NOTE — Assessment & Plan Note (Signed)
Overall he tolerates chemotherapy.   CEA is relatively stable with small fluctuations. Labs reviewed and discussed with patient Proceed with 5-FU/bevacizumab treatment today PET scan images were reviewed by me and discussed with patient.  Stable size and decreased hypermetabolic activity. Continue current regimen.

## 2022-01-03 NOTE — Assessment & Plan Note (Signed)
continue to monitor.  Hemoglobin is close to baseline.

## 2022-01-03 NOTE — Assessment & Plan Note (Signed)
Obtained labs for PCP. He will need urology evaluation.  Will send results to PCP

## 2022-01-03 NOTE — Assessment & Plan Note (Signed)
Patient has gabapentin supply at home.Patient declines gabapentin and acupuncture.  

## 2022-01-03 NOTE — Progress Notes (Signed)
ON PATHWAY REGIMEN - Colorectal  No Change  Continue With Treatment as Ordered.  Original Decision Date/Time: 11/11/2020 21:25     A cycle is every 14 days:     Bevacizumab-xxxx      Irinotecan      Leucovorin      Fluorouracil      Fluorouracil   **Always confirm dose/schedule in your pharmacy ordering system**  Patient Characteristics: Distant Metastases, Nonsurgical Candidate, KRAS/NRAS Mutation Positive/Unknown (BRAF V600 Wild-Type/Unknown), Standard Cytotoxic Therapy, Second Line Standard Cytotoxic Therapy, Bevacizumab Eligible Tumor Location: Colon Therapeutic Status: Distant Metastases Microsatellite/Mismatch Repair Status: MSS/pMMR BRAF Mutation Status: Wild-Type (no mutation) KRAS/NRAS Mutation Status: Mutation Positive Standard Cytotoxic Line of Therapy: Second Line Standard Cytotoxic Therapy Bevacizumab Eligibility: Eligible Intent of Therapy: Non-Curative / Palliative Intent, Discussed with Patient

## 2022-01-03 NOTE — Progress Notes (Signed)
Nutrition  Noted nutrition appointment cancelled for today and moved to 8/31 per scheduling.  Luster Hechler B. Zenia Resides, [place], [place] Registered Dietitian [phone]

## 2022-01-03 NOTE — Assessment & Plan Note (Signed)
Chemotherapy plan as listed above 

## 2022-01-03 NOTE — Progress Notes (Signed)
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   CEA is relatively stable with small fluctuations. Labs reviewed and discussed with patient Proceed with 5-FU/bevacizumab treatment today PET scan images were reviewed by me and discussed with patient.  Stable size and decreased hypermetabolic activity. Continue current regimen.  Encounter for antineoplastic chemotherapy Chemotherapy plan as listed above.   Anemia due to chemotherapy continue to monitor.  Hemoglobin is close to baseline.  Chemotherapy-induced neuropathy ([place]) Patient has gabapentin supply at home.Patient declines gabapentin and acupuncture.   Elevated PSA Obtained labs for PCP. He will need urology evaluation.  Will send results to PCP  Weight loss, refer to nutritionist   Follow-up 2 weeks lab MD 5-FU/bevacizumab -   All questions were answered. The patient knows to call the clinic with any problems, questions or concerns.  [name], MD, PhD [facility] Hematology Oncology 01/03/2022   Chief Complaint: Jared Gutierrez is a 81 y.o. male presents for follow-up of metastatic colon cancer   PERTINENT ONCOLOGY HISTORY Jared Gutierrez is a 81 y.o.[place] who has above oncology history reviewed by me today presented for follow up visit for management of Metastatic colon cancer. [name], patient switched care to me on 11/11/20 Extensive medical record review was performed by me  Oncology History  History of colon cancer, stage I  10/02/2014 Pathology Results   [id number] colonoscopy pathology A. CECAL POLYPS; HOT SNARE AND COLD BIOPSY:   - TUBULAR ADENOMA, MULTIPLE FRAGMENTS.  - NEGATIVE FOR HIGH-GRADE DYSPLASIA AND MALIGNANCY.   B. COLON POLYP, TRANSVERSE; COLD BIOPSY:  - TUBULAR ADENOMA.  - NEGATIVE FOR HIGH-GRADE DYSPLASIA AND MALIGNANCY.     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  Tumor extended into the submucosa. Margins were negative. + lymphovascular invasion. 14 lymph nodes were negative. Pathologic stage was T1 N0.  TRANSVERSE COLON, RESECTION:  - INVASIVE ADENOCARCINOMA ARISING IN A 4.6 CM TUBULOVILLOUS  ADENOMA WITH HIGH GRADE DYSPLASIA (MALIGNANT POLYP).  - SEE SUMMARY BELOW.  [name]  ONCOLOGY SUMMARY: COLON AND RECTUM, RESECTION, AJCC 7TH EDITION  Specimen: transverse colon  Procedure: transverse colon resection  Tumor site: splenic flexure  Tumor size: 1.0 cm (invasive component)  Macroscopic tumor perforation: not specified  Histologic type: invasive adenocarcinoma  Histologic grade: moderately differentiated  Microscopic tumor extension: into submucosa  Margins:       Proximal margin: negative       Distal margin: negative       Circumferential (radial) or mesenteric margin: negative       If all margins uninvolved by invasive carcinoma:  Distance of invasive carcinoma from closest margin: 7.5 cm  to distal margin  Treatment effect: not applicable  Lymph-vascular invasion: present  Perineural invasion: not identified  Tumor deposits  (discontinuous extramural extension): not  identified  Pathologic staging:       Primary tumor: pT1       Regional lymph nodes: pN0            Number of nodes examined: 14            Number of nodes involved: 0    04/02/2019 Progression   04/02/2019  PET scan  limited evealed a 2.4 x 2.3 cm (SUV 11) hypermetabolic soft tissue density caudal and anterior to the pancreatic neck favored to represent isolated peritoneal or nodal metastasis in the setting of prior transverse colonic resection (expected primary drainage). Although this was immediately adjacent to the pancreas, a fat plane was maintained, arguing strongly against a pancreatic primary. Otherwise, there was no evidence of hypermetabolic metastasis. 04/17/2019 EUS on  revealed a normal esophagus, stomach, duodenum, and pancreas.  There was a 2.4 x 2.4 cm irregular mass in the retroperitoneum adjacent to, but not involving the pancreatic neck.  FNA and core needle biopsy were performed.  Pathology revealed adenocarcinoma compatible with a metastatic lesion of colorectal origin.  Tumor cells were positive for CK20 and CDX2 and negative for CK7.  NGS: [employer] on 05/15/2019 revealed + KRASG13D and TP53.  Negative results included BRAF V600E, Her2, NRAS, NTRK, PD-L1 (<1%), and TMB 8.7/Mb (intermediate).  MMR testing from his colon resection on 10/16/2013 was intact with a low probability of MSI-H.   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  He received Neulasta after cycle #4 and #5 secondary to progressive leukopenia.  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  He received a truncated course of FOLFOX with cycle #11 secondary to oxaliplatin reaction.   01/14/2020 Imaging    PET revealed an interval decrease in size and FDG uptake (2.4 x 2.3 cm with SUV 11 to 2.4 x 1.7 cm with SUV 4.09)  associated with the previously referenced soft tissue density caudal and anterior to the pancreatic neck suggesting treatment response. There were no new sites of FDG avid tumor.   08/31/2020 Imaging   08/31/2020 Abdomen and pelvis CT revealed increased size of the index soft tissue lesion inferior and anterior to the pancreatic neck (2.9 x 1.8 cm to 3.5 x 2.9 cm). There were similar prominent retroperitoneal lymph nodes without adenopathy by size criteria. There was no new or enlarging abdominal or pelvic lymph nodes. There were no new interval findings. There was similar circumferential wall thickening of a nondistended urinary bladder, which likely accentuated wall thickening There was hepatic steatosis and aortic atherosclerosis.   11/03/2020, PET showed recurrent peritoneal metastasis in the upper abdomen adjacent to the pancreas.  The lesion is 3.8 x 2.9 cm with SUV of 11.3. No evidence of metastatic peritoneal disease elsewhere in the abdomen pelvis.  No evidence of distant metastasis.   11/03/2020 Imaging   PET scan showed recurrent peritoneal metastasis near pancreas. Given that he has no other distant metastasis. Discussed with radiation oncology. Repeat SBRT may be considered if he does not respond well to systemic chemotherapy.     11/26/2020 -  Chemotherapy   5-FU and bevacizumab.   02/17/2021 Imaging  02/17/2021 CT abdomen pelvis showed partial response. Mild decrease of peritoneal lesion.  Proceed with 5-FU and bevacizumab today.  Given that he is tolerating current regimen with good life quality, partial response.  Shared decision was made to hold off adding additional chemotherapy agents.   06/08/2021, CT chest abdomen pelvis with contrast showed soft tissue mass at the base of mesentery inferior to the pancreatic neck is unchanged in size.  No progressive disease was identified.   09/07/2021, CT chest abdomen pelvis with contrast showed no substantial changes in size of the  mesenteric mass, 2.6 cm.  No suspicious new lesions.  Chronic findings as detailed in the imaging report.   12/05/2021 Imaging   PET scan showed soft tissue mesenteric mass is unchanged in size.  Decreased FDG uptake compared to activity on November 03, 2020.  Fever treated disease.  No new metastatic disease.      Other medical problems Chronic lower extremity edema. 12/24/2018, right lower extremity duplex negative for DVT.  Small right Baker's cyst. 08/16/2019, bilateral lower extremity duplex showed no DVT. 08/16/2019 - 08/17/2019 with right lower extremity cellulitis.  He was unable to bear weight.  He was treated with IV fluids, NSAIDs, colchicine, and broad antibiotics (vancomycin and Cefepime).  He was discharged on indomethacin x 5 days and Keflex 500 mg TID x 5 days.  10/05/2020, colonoscopy showed internal hemorrhoids.  Otherwise normal examination.  INTERVAL HISTORY BRAX WALEN is a 81 y.o. male who has above history reviewed by me today presents for follow up visit for management of recurrent metastatic colon cancer Patient was accompanied by wife.   Denies fever, chills, nausea, vomiting, diarrhea, chest pain, shortness of breath, abdominal pain, urinary symptoms.  For constipation, he takes stool softener and symptom has improved.  No blood in stool recently. He has lost weight.  Reports eating less compared to prior.  No other new complaints.   Review of Systems  Constitutional:  Negative for appetite change, chills, diaphoresis, fever and unexpected weight change.  HENT:   Negative for hearing loss, nosebleeds, sore throat and tinnitus.   Respiratory:  Negative for cough, hemoptysis and shortness of breath.   Cardiovascular:  Negative for chest pain and palpitations.  Gastrointestinal:  Negative for abdominal pain, blood in stool, constipation, diarrhea, nausea and vomiting.  Genitourinary:  Negative for dysuria, frequency and hematuria.   Musculoskeletal:  Positive for  arthralgias. Negative for back pain, myalgias and neck pain.  Skin:  Negative for itching and rash.  Neurological:  Positive for numbness. Negative for dizziness and headaches.  Hematological:  Does not bruise/bleed easily.  Psychiatric/Behavioral:  Negative for depression. The patient is not nervous/anxious.       Past Medical History:  Diagnosis Date   Arthritis    OSTEOARTHRITIS   Cancer ([place])    Cavitary lesion of lung    RIGHT LOWER LOBE   Chicken pox    Colon cancer ([facility])    History of kidney stones    Hypertension    Lipoma of colon    Nephrolithiasis    Nephrolithiasis    Obesity    Shingles    Tubular adenoma of colon    multiple fragments    Past Surgical History:  Procedure Laterality Date   COLON SURGERY     COLONOSCOPY N/A 10/02/2014   Procedure: COLONOSCOPY;  Surgeon: Josefine Class, MD;  Location: [facility] ENDOSCOPY;  Service: Endoscopy;  Laterality: N/A;   COLONOSCOPY WITH PROPOFOL N/A 02/16/2017   Procedure:  COLONOSCOPY WITH PROPOFOL;  Surgeon: Manya Silvas, MD;  Location: [facility] ENDOSCOPY;  Service: Endoscopy;  Laterality: N/A;   COLONOSCOPY WITH PROPOFOL N/A 10/05/2020   Procedure: COLONOSCOPY WITH PROPOFOL;  Surgeon: Lesly Rubenstein, MD;  Location: [facility] ENDOSCOPY;  Service: Endoscopy;  Laterality: N/A;   EUS N/A 04/17/2019   Procedure: FULL UPPER ENDOSCOPIC ULTRASOUND (EUS) RADIAL;  Surgeon: Holly Bodily, MD;  Location: [facility] ENDOSCOPY;  Service: Gastroenterology;  Laterality: N/A;   KIDNEY STONE SURGERY     PARTIAL COLECTOMY  10/17/2013   PORTACATH PLACEMENT Right 06/13/2019   Procedure: INSERTION PORT-A-CATH;  Surgeon: Benjamine Sprague, DO;  Location: [facility] ORS;  Service: General;  Laterality: Right;    Family History  Problem Relation Age of Onset   Cancer Mother    Breast cancer Mother    COPD Father     Social History:  reports that he has never smoked. He has never used smokeless tobacco. He reports current alcohol use. He reports  that he does not use drugs.    Allergies: No Known Allergies  Current Medications: Current Outpatient Medications  Medication Sig Dispense Refill   acetaminophen (TYLENOL) 500 MG tablet Take 500 mg by mouth every 6 (six) hours as needed.     amLODipine (NORVASC) 2.5 MG tablet Take 2.5 mg by mouth daily.     aspirin EC 81 MG tablet Take 81 mg by mouth daily.     Calcium Carbonate (CALCIUM 600 PO) Take 1 tablet by mouth 2 (two) times daily.     Cholecalciferol (VITAMIN D) 125 MCG (5000 UT) CAPS Take 5,000 mg by mouth.     docusate sodium (COLACE) 100 MG capsule Take 1 capsule (100 mg total) by mouth 2 (two) times daily as needed for mild constipation or moderate constipation. 60 capsule 3   gabapentin (NEURONTIN) 100 MG capsule Take 1 capsule (100 mg total) by mouth 3 (three) times daily. 90 capsule 0   HYDROcodone-acetaminophen (NORCO/VICODIN) 5-325 MG tablet Take 1 tablet by mouth every 6 (six) hours as needed for moderate pain (up to 3 doses for moderate pain.).     KLOR-CON M20 20 MEQ tablet TAKE 1 TABLET BY MOUTH TWICE A DAY 60 tablet 0   lidocaine-prilocaine (EMLA) cream Apply to affected area once 30 g 3   loperamide (IMODIUM) 2 MG capsule Take 1 capsule (2 mg total) by mouth See admin instructions. Take 2 tablets after first loose stool,  then 1 tablet  after each loose stool; maximum: 8 tablets /day 60 capsule 0   loratadine (CLARITIN) 10 MG tablet Take 10 mg by mouth daily.     olmesartan (BENICAR) 20 MG tablet Take 1 tablet by mouth daily.     ondansetron (ZOFRAN) 8 MG tablet Take 1 tablet (8 mg total) by mouth 2 (two) times daily as needed for refractory nausea / vomiting. Start on day 3 after chemotherapy. 60 tablet 1   Probiotic Product (PROBIOTIC DAILY PO) Take 1 tablet by mouth daily.     prochlorperazine (COMPAZINE) 10 MG tablet Take 1 tablet (10 mg total) by mouth every 6 (six) hours as needed (NAUSEA). 30 tablet 1   No current facility-administered medications for this visit.    Facility-Administered Medications Ordered in Other Visits  Medication Dose Route Frequency Provider Last Rate Last Admin   0.9 %  sodium chloride infusion   Intravenous Once Corcoran, Melissa C, MD       0.9 %  sodium chloride infusion   Intravenous Continuous Corcoran,  Drue Second, MD 10 mL/hr at 12/31/19 1000 New Bag at 10/05/20 1045   fluorouracil (ADRUCIL) 5,500 mg in sodium chloride 0.9 % 140 mL chemo infusion  2,400 mg/m2 (Order-Specific) Intravenous 1 day or 1 dose [name], MD   Infusion Verify at 01/03/22 1617   heparin lock flush 100 unit/mL  500 Units Intravenous Once Lequita Asal, MD        Performance status (ECOG): 1  Vitals Blood pressure 115/69, pulse 71, temperature (!) 96.4 F (35.8 C), resp. rate 20, weight 240 lb 12.8 oz (109.2 kg), SpO2 100 %.   Physical Exam Vitals and nursing note reviewed.  Constitutional:      General: He is not in acute distress.    Appearance: He is well-developed. He is obese. He is not diaphoretic.     Interventions: Face mask in place.  HENT:     Head: Normocephalic and atraumatic.     Mouth/Throat:     Mouth: Mucous membranes are moist.     Pharynx: Oropharynx is clear.  Eyes:     General: No scleral icterus.    Extraocular Movements: Extraocular movements intact.     Conjunctiva/sclera: Conjunctivae normal.     Pupils: Pupils are equal, round, and reactive to light.  Cardiovascular:     Rate and Rhythm: Normal rate and regular rhythm.     Heart sounds: Normal heart sounds. No murmur heard. Pulmonary:     Effort: Pulmonary effort is normal. No respiratory distress.     Breath sounds: No wheezing or rales.     Comments: Decreased breath sound bilaterally Chest:     Chest wall: No tenderness.  Abdominal:     General: Bowel sounds are normal. There is no distension.     Palpations: Abdomen is soft. There is no mass.     Tenderness: There is no abdominal tenderness. There is no guarding or rebound.  Musculoskeletal:         General: No swelling or tenderness. Normal range of motion.     Cervical back: Normal range of motion and neck supple.     Right lower leg: Edema present.     Left lower leg: Edema present.  Lymphadenopathy:     Head:     Right side of head: No preauricular, posterior auricular or occipital adenopathy.     Left side of head: No preauricular, posterior auricular or occipital adenopathy.     Cervical: No cervical adenopathy.     Upper Body:     Right upper body: No supraclavicular or axillary adenopathy.     Left upper body: No supraclavicular or axillary adenopathy.     Lower Body: No right inguinal adenopathy. No left inguinal adenopathy.  Skin:    General: Skin is warm and dry.  Neurological:     General: No focal deficit present.     Mental Status: He is alert and oriented to person, place, and time. Mental status is at baseline.  Psychiatric:        Mood and Affect: Mood normal.    Labs were reviewed by me.     Latest Ref Rng & Units 01/03/2022    8:29 AM 12/20/2021    8:52 AM 12/06/2021    8:38 AM  CBC  WBC 4.0 - 10.5 K/uL 4.3  4.6  5.0   Hemoglobin 13.0 - 17.0 g/dL 12.0  11.9  11.8   Hematocrit 39.0 - 52.0 % 36.7  35.9  35.5   Platelets 150 -  400 K/uL 171  164  160       Latest Ref Rng & Units 01/03/2022    8:29 AM 12/20/2021    8:52 AM 12/06/2021    8:38 AM  CMP  Glucose 70 - 99 mg/dL 109  109  142   BUN 8 - 23 mg/dL _0 Creatinine 0.61 - 1.24 mg/dL 0.72  0.82  0.67   Sodium 135 - 145 mmol/L 136  137  135   Potassium 3.5 - 5.1 mmol/L 3.8  4.0  3.2   Chloride 98 - 111 mmol/L 106  105  107   CO2 22 - 32 mmol/L _1 Calcium 8.9 - 10.3 mg/dL 9.1  9.7  9.0   Total Protein 6.5 - 8.1 g/dL 6.3  6.3  6.5   Total Bilirubin 0.3 - 1.2 mg/dL 1.3  0.9  1.2   Alkaline Phos 38 - 126 U/L 36  36  34   AST 15 - 41 U/L 36  39  35   ALT 0 - 44 U/L 20  24  21

## 2022-01-04 ENCOUNTER — Other Ambulatory Visit: Payer: Self-pay

## 2022-01-04 ENCOUNTER — Telehealth: Payer: Self-pay

## 2022-01-04 LAB — CEA: CEA: 13 ng/mL — ABNORMAL HIGH (ref 0.0–4.7)

## 2022-01-04 NOTE — Telephone Encounter (Signed)
Patient lab work routed to PCP office.

## 2022-01-05 ENCOUNTER — Other Ambulatory Visit: Payer: Self-pay

## 2022-01-05 ENCOUNTER — Inpatient Hospital Stay: Payer: Medicare Other

## 2022-01-05 DIAGNOSIS — C786 Secondary malignant neoplasm of retroperitoneum and peritoneum: Secondary | ICD-10-CM

## 2022-01-05 DIAGNOSIS — Z85038 Personal history of other malignant neoplasm of large intestine: Secondary | ICD-10-CM

## 2022-01-05 DIAGNOSIS — Z5112 Encounter for antineoplastic immunotherapy: Secondary | ICD-10-CM | POA: Diagnosis not present

## 2022-01-05 MED ORDER — SODIUM CHLORIDE 0.9% FLUSH
10.0000 mL | INTRAVENOUS | Status: DC | PRN
  Administered 2022-01-05: 10 mL
  Filled 2022-01-05: qty 10

## 2022-01-05 MED ORDER — HEPARIN SOD (PORK) LOCK FLUSH 100 UNIT/ML IV SOLN
500.0000 [IU] | Freq: Once | INTRAVENOUS | Status: AC | PRN
  Administered 2022-01-05: 500 [IU]
  Filled 2022-01-05: qty 5

## 2022-01-07 ENCOUNTER — Other Ambulatory Visit: Payer: Self-pay | Admitting: Oncology

## 2022-01-07 DIAGNOSIS — C786 Secondary malignant neoplasm of retroperitoneum and peritoneum: Secondary | ICD-10-CM

## 2022-01-07 DIAGNOSIS — Z85038 Personal history of other malignant neoplasm of large intestine: Secondary | ICD-10-CM

## 2022-01-12 ENCOUNTER — Inpatient Hospital Stay: Payer: Medicare Other

## 2022-01-12 NOTE — Progress Notes (Signed)
Nutrition Assessment   Reason for Assessment:   Weight loss, on chemo   ASSESSMENT:  81 year old male with metastatic colon cancer. S/p transverse colectomy (2015), HTN.    Met with patient in clinic.  Reports that his appetite is good.  Says that he is having problems with constipation especially week of treatment.  Usually has eggs, breakfast meats for breakfast.  Likes yogurt and applesauce.  Lunch yesterday was [place] wing, cabbage and creamed corn.  Supper last night was ham sandwich and peaches.  Drinks prune juice to help with constipation.  Also drinking water, some coffee.  Drinks ensure daily.     Medications: calcium carbonate, Vit D, colace, imodium, probiotic, zofran, compazine   Labs: glucose 109   Anthropometrics:   Height: 68 inches Weight: 240 lb 12.8 oz on 8/22 262 lb 05/27/21 BMI: 36  8% weight loss in the last 7 months   NUTRITION DIAGNOSIS: Unintentional weight loss related to cancer and cancer related treatment side effects as evidenced by 8% weight loss in the last 7 months and constipation.    INTERVENTION:  Discussed strategies to help with constipation.   Encouraged stool softners to help with constipation and touching base with MD if not working Encouraged good sources of protein Contact information provided    MONITORING, EVALUATION, GOAL: weight trends, intake   Next Visit: Tuesday, Sept 19 during infusion  Royalty Fakhouri B. Zenia Resides, [place], [place] Registered Dietitian [phone]

## 2022-01-17 ENCOUNTER — Inpatient Hospital Stay: Payer: Medicare Other | Attending: Oncology

## 2022-01-17 ENCOUNTER — Inpatient Hospital Stay: Payer: Medicare Other

## 2022-01-17 ENCOUNTER — Inpatient Hospital Stay (HOSPITAL_BASED_OUTPATIENT_CLINIC_OR_DEPARTMENT_OTHER): Payer: Medicare Other | Admitting: Oncology

## 2022-01-17 ENCOUNTER — Encounter: Payer: Self-pay | Admitting: Oncology

## 2022-01-17 VITALS — BP 120/79 | HR 65 | Temp 98.8°F | Resp 18 | Wt 244.0 lb

## 2022-01-17 DIAGNOSIS — Z85038 Personal history of other malignant neoplasm of large intestine: Secondary | ICD-10-CM

## 2022-01-17 DIAGNOSIS — E669 Obesity, unspecified: Secondary | ICD-10-CM | POA: Diagnosis not present

## 2022-01-17 DIAGNOSIS — G620 Drug-induced polyneuropathy: Secondary | ICD-10-CM | POA: Insufficient documentation

## 2022-01-17 DIAGNOSIS — Z5111 Encounter for antineoplastic chemotherapy: Secondary | ICD-10-CM

## 2022-01-17 DIAGNOSIS — C184 Malignant neoplasm of transverse colon: Secondary | ICD-10-CM | POA: Diagnosis present

## 2022-01-17 DIAGNOSIS — C786 Secondary malignant neoplasm of retroperitoneum and peritoneum: Secondary | ICD-10-CM | POA: Diagnosis not present

## 2022-01-17 DIAGNOSIS — D6481 Anemia due to antineoplastic chemotherapy: Secondary | ICD-10-CM | POA: Insufficient documentation

## 2022-01-17 DIAGNOSIS — Z79899 Other long term (current) drug therapy: Secondary | ICD-10-CM | POA: Diagnosis not present

## 2022-01-17 DIAGNOSIS — Z5112 Encounter for antineoplastic immunotherapy: Secondary | ICD-10-CM | POA: Diagnosis present

## 2022-01-17 DIAGNOSIS — R634 Abnormal weight loss: Secondary | ICD-10-CM | POA: Insufficient documentation

## 2022-01-17 DIAGNOSIS — Z809 Family history of malignant neoplasm, unspecified: Secondary | ICD-10-CM | POA: Insufficient documentation

## 2022-01-17 DIAGNOSIS — Z803 Family history of malignant neoplasm of breast: Secondary | ICD-10-CM | POA: Insufficient documentation

## 2022-01-17 DIAGNOSIS — Z836 Family history of other diseases of the respiratory system: Secondary | ICD-10-CM | POA: Diagnosis not present

## 2022-01-17 DIAGNOSIS — T451X5D Adverse effect of antineoplastic and immunosuppressive drugs, subsequent encounter: Secondary | ICD-10-CM | POA: Diagnosis not present

## 2022-01-17 DIAGNOSIS — Z86010 Personal history of colonic polyps: Secondary | ICD-10-CM | POA: Insufficient documentation

## 2022-01-17 DIAGNOSIS — T451X5A Adverse effect of antineoplastic and immunosuppressive drugs, initial encounter: Secondary | ICD-10-CM

## 2022-01-17 LAB — COMPREHENSIVE METABOLIC PANEL
ALT: 20 U/L (ref 0–44)
AST: 31 U/L (ref 15–41)
Albumin: 3.4 g/dL — ABNORMAL LOW (ref 3.5–5.0)
Alkaline Phosphatase: 34 U/L — ABNORMAL LOW (ref 38–126)
Anion gap: 8 (ref 5–15)
BUN: 11 mg/dL (ref 8–23)
CO2: 26 mmol/L (ref 22–32)
Calcium: 9.1 mg/dL (ref 8.9–10.3)
Chloride: 103 mmol/L (ref 98–111)
Creatinine, Ser: 0.62 mg/dL (ref 0.61–1.24)
GFR, Estimated: >60 mL/min (ref >60)
Glucose, Bld: 91 mg/dL (ref 70–99)
Potassium: 3.7 mmol/L (ref 3.5–5.1)
Sodium: 137 mmol/L (ref 135–145)
Total Bilirubin: 1.1 mg/dL (ref 0.3–1.2)
Total Protein: 6.5 g/dL (ref 6.5–8.1)

## 2022-01-17 LAB — CBC WITH DIFFERENTIAL/PLATELET
Abs Immature Granulocytes: 0.02 10*3/uL (ref 0.00–0.07)
Basophils Absolute: 0 10*3/uL (ref 0.0–0.1)
Basophils Relative: 1 %
Eosinophils Absolute: 0.1 10*3/uL (ref 0.0–0.5)
Eosinophils Relative: 2 %
HCT: 35.2 % — ABNORMAL LOW (ref 39.0–52.0)
Hemoglobin: 11.6 g/dL — ABNORMAL LOW (ref 13.0–17.0)
Immature Granulocytes: 0 %
Lymphocytes Relative: 33 %
Lymphs Abs: 1.5 10*3/uL (ref 0.7–4.0)
MCH: 31.4 pg (ref 26.0–34.0)
MCHC: 33 g/dL (ref 30.0–36.0)
MCV: 95.1 fL (ref 80.0–100.0)
Monocytes Absolute: 0.5 10*3/uL (ref 0.1–1.0)
Monocytes Relative: 11 %
Neutro Abs: 2.4 10*3/uL (ref 1.7–7.7)
Neutrophils Relative %: 53 %
Platelets: 158 10*3/uL (ref 150–400)
RBC: 3.7 MIL/uL — ABNORMAL LOW (ref 4.22–5.81)
RDW: 14.6 % (ref 11.5–15.5)
WBC: 4.5 10*3/uL (ref 4.0–10.5)
nRBC: 0 % (ref 0.0–0.2)

## 2022-01-17 LAB — PROTEIN, URINE, RANDOM: Total Protein, Urine: 13 mg/dL

## 2022-01-17 MED ORDER — SODIUM CHLORIDE 0.9 % IV SOLN
2400.0000 mg/m2 | INTRAVENOUS | Status: DC
  Administered 2022-01-17: 5500 mg via INTRAVENOUS
  Filled 2022-01-17: qty 110

## 2022-01-17 MED ORDER — SODIUM CHLORIDE 0.9 % IV SOLN
600.0000 mg | Freq: Once | INTRAVENOUS | Status: AC
  Administered 2022-01-17: 600 mg via INTRAVENOUS
  Filled 2022-01-17: qty 16

## 2022-01-17 MED ORDER — SODIUM CHLORIDE 0.9 % IV SOLN
900.0000 mg | Freq: Once | INTRAVENOUS | Status: AC
  Administered 2022-01-17: 900 mg via INTRAVENOUS
  Filled 2022-01-17: qty 45

## 2022-01-17 MED ORDER — SODIUM CHLORIDE 0.9 % IV SOLN
10.0000 mg | Freq: Once | INTRAVENOUS | Status: AC
  Administered 2022-01-17: 10 mg via INTRAVENOUS
  Filled 2022-01-17: qty 10

## 2022-01-17 MED ORDER — HEPARIN SOD (PORK) LOCK FLUSH 100 UNIT/ML IV SOLN
500.0000 [IU] | Freq: Once | INTRAVENOUS | Status: AC | PRN
  Administered 2022-01-17: 500 [IU]
  Filled 2022-01-17: qty 5

## 2022-01-17 MED ORDER — FLUOROURACIL CHEMO INJECTION 2.5 GM/50ML
400.0000 mg/m2 | Freq: Once | INTRAVENOUS | Status: AC
  Administered 2022-01-17: 900 mg via INTRAVENOUS
  Filled 2022-01-17: qty 18

## 2022-01-17 MED ORDER — SODIUM CHLORIDE 0.9 % IV SOLN
Freq: Once | INTRAVENOUS | Status: AC
  Filled 2022-01-17: qty 250

## 2022-01-17 NOTE — Patient Instructions (Signed)
[facility] AT [place]  Discharge Instructions: Thank you for choosing [facility] to provide your oncology and hematology care.  If you have a lab appointment with the [facility], please go directly to the [facility] and check in at the registration area.  Wear comfortable clothing and clothing appropriate for easy access to any Portacath or PICC line.   We strive to give you quality time with your provider. You may need to reschedule your appointment if you arrive late (15 or more minutes).  Arriving late affects you and other patients whose appointments are after yours.  Also, if you miss three or more appointments without notifying the office, you may be dismissed from the clinic at the provider's discretion.      For prescription refill requests, have your pharmacy contact our office and allow 72 hours for refills to be completed.    Today you received the following chemotherapy and/or immunotherapy agents MVASI, LEUCOVORIN, 5 FU      To help prevent nausea and vomiting after your treatment, we encourage you to take your nausea medication as directed.  BELOW ARE SYMPTOMS THAT SHOULD BE REPORTED IMMEDIATELY: *FEVER GREATER THAN 100.4 F (38 C) OR HIGHER *CHILLS OR SWEATING *NAUSEA AND VOMITING THAT IS NOT CONTROLLED WITH YOUR NAUSEA MEDICATION *UNUSUAL SHORTNESS OF BREATH *UNUSUAL BRUISING OR BLEEDING *URINARY PROBLEMS (pain or burning when urinating, or frequent urination) *BOWEL PROBLEMS (unusual diarrhea, constipation, pain near the anus) TENDERNESS IN MOUTH AND THROAT WITH OR WITHOUT PRESENCE OF ULCERS (sore throat, sores in mouth, or a toothache) UNUSUAL RASH, SWELLING OR PAIN  UNUSUAL VAGINAL DISCHARGE OR ITCHING   Items with * indicate a potential emergency and should be followed up as soon as possible or go to the Emergency Department if any problems should occur.  Please show the CHEMOTHERAPY ALERT CARD or IMMUNOTHERAPY ALERT CARD at  check-in to the Emergency Department and triage nurse.  Should you have questions after your visit or need to cancel or reschedule your appointment, please contact [facility] [place] AT [place]  [phone] and follow the prompts.  Office hours are 8:00 a.m. to 4:30 p.m. Monday - Friday. Please note that voicemails left after 4:00 p.m. may not be returned until the following business day.  We are closed weekends and major holidays. You have access to a nurse at all times for urgent questions. Please call the main number to the clinic [phone] and follow the prompts.  For any non-urgent questions, you may also contact your provider using MyChart. We now offer e-Visits for anyone 42 and older to request care online for non-urgent symptoms. For details visit [email or web address].   Also download the MyChart app! Go to the app store, search "MyChart", open the app, select [facility], and log in with your MyChart username and password.  Masks are optional in the cancer centers. If you would like for your care team to wear a mask while they are taking care of you, please let them know. For doctor visits, patients may have with them one support person who is at least 81 years old. At this time, visitors are not allowed in the infusion area.  Bevacizumab Injection What is this medication? BEVACIZUMAB (be va SIZ yoo mab) treats some types of cancer. It works by blocking a protein that causes cancer cells to grow and multiply. This helps to slow or stop the spread of cancer cells. It is a monoclonal antibody. This medicine may be used for  other purposes; ask your health care provider or pharmacist if you have questions. COMMON BRAND NAME(S): Alymsys, Avastin, MVASI, Noah Charon What should I tell my care team before I take this medication? They need to know if you have any of these conditions: Blood clots Coughing up blood Having or recent surgery Heart failure High blood  pressure History of a connection between 2 or more body parts that do not usually connect (fistula) History of a tear in your stomach or intestines Protein in your urine An unusual or allergic reaction to bevacizumab, other medications, foods, dyes, or preservatives Pregnant or trying to get pregnant Breast-feeding How should I use this medication? This medication is injected into a vein. It is given by your care team in a hospital or clinic setting. Talk to your care team the use of this medication in children. Special care may be needed. Overdosage: If you think you have taken too much of this medicine contact a poison control center or emergency room at once. NOTE: This medicine is only for you. Do not share this medicine with others. What if I miss a dose? Keep appointments for follow-up doses. It is important not to miss your dose. Call your care team if you are unable to keep an appointment. What may interact with this medication? Interactions are not expected. This list may not describe all possible interactions. Give your health care provider a list of all the medicines, herbs, non-prescription drugs, or dietary supplements you use. Also tell them if you smoke, drink alcohol, or use illegal drugs. Some items may interact with your medicine. What should I watch for while using this medication? Your condition will be monitored carefully while you are receiving this medication. You may need blood work while taking this medication. This medication may make you feel generally unwell. This is not uncommon as chemotherapy can affect healthy cells as well as cancer cells. Report any side effects. Continue your course of treatment even though you feel ill unless your care team tells you to stop. This medication may increase your risk to bruise or bleed. Call your care team if you notice any unusual bleeding. Before having surgery, talk to your care team to make sure it is ok. This medication can  increase the risk of poor healing of your surgical site or wound. You will need to stop this medication for 28 days before surgery. After surgery, wait at least 28 days before restarting this medication. Make sure the surgical site or wound is healed enough before restarting this medication. Talk to your care team if questions. Talk to your care team if you may be pregnant. Serious birth defects can occur if you take this medication during pregnancy and for 6 months after the last dose. Contraception is recommended while taking this medication and for 6 months after the last dose. Your care team can help you find the option that works for you. Do not breastfeed while taking this medication and for 6 months after the last dose. This medication can cause infertility. Talk to your care team if you are concerned about your fertility. What side effects may I notice from receiving this medication? Side effects that you should report to your care team as soon as possible: Allergic reactions--skin rash, itching, hives, swelling of the face, lips, tongue, or throat Bleeding--bloody or black, tar-like stools, vomiting blood or brown material that looks like coffee grounds, red or dark brown urine, small red or purple spots on skin, unusual bruising or bleeding  Blood clot--pain, swelling, or warmth in the leg, shortness of breath, chest pain Heart attack--pain or tightness in the chest, shoulders, arms, or jaw, nausea, shortness of breath, cold or clammy skin, feeling faint or lightheaded Heart failure--shortness of breath, swelling of the ankles, feet, or hands, sudden weight gain, unusual weakness or fatigue Increase in blood pressure Infection--fever, chills, cough, sore throat, wounds that don't heal, pain or trouble when passing urine, general feeling of discomfort or being unwell Infusion reactions--chest pain, shortness of breath or trouble breathing, feeling faint or lightheaded Kidney injury--decrease in  the amount of urine, swelling of the ankles, hands, or feet Stomach pain that is severe, does not go away, or gets worse Stroke--sudden numbness or weakness of the face, arm, or leg, trouble speaking, confusion, trouble walking, loss of balance or coordination, dizziness, severe headache, change in vision Sudden and severe headache, confusion, change in vision, seizures, which may be signs of posterior reversible encephalopathy syndrome (PRES) Side effects that usually do not require medical attention (report to your care team if they continue or are bothersome): Back pain Change in taste Diarrhea Dry skin Increased tears Nosebleed This list may not describe all possible side effects. Call your doctor for medical advice about side effects. You may report side effects to FDA at 1-800-FDA-1088. Where should I keep my medication? This medication is given in a hospital or clinic. It will not be stored at home. NOTE: This sheet is a summary. It may not cover all possible information. If you have questions about this medicine, talk to your doctor, pharmacist, or health care provider.  2023 Elsevier/Gold Standard (2021-09-13 00:00:00)  Leucovorin Injection What is this medication? LEUCOVORIN (loo koe VOR in) prevents side effects from certain medications, such as methotrexate. It works by increasing folate levels. This helps protect healthy cells in your body. It may also be used to treat anemia caused by low levels of folate. It can also be used with fluorouracil, a type of chemotherapy, to treat colorectal cancer. It works by increasing the effects of fluorouracil in the body. This medicine may be used for other purposes; ask your health care provider or pharmacist if you have questions. What should I tell my care team before I take this medication? They need to know if you have any of these conditions: Anemia from low levels of vitamin B12 in the blood An unusual or allergic reaction to  leucovorin, folic acid, other medications, foods, dyes, or preservatives Pregnant or trying to get pregnant Breastfeeding How should I use this medication? This medication is injected into a vein or a muscle. It is given by your care team in a hospital or clinic setting. Talk to your care team about the use of this medication in children. Special care may be needed. Overdosage: If you think you have taken too much of this medicine contact a poison control center or emergency room at once. NOTE: This medicine is only for you. Do not share this medicine with others. What if I miss a dose? Keep appointments for follow-up doses. It is important not to miss your dose. Call your care team if you are unable to keep an appointment. What may interact with this medication? Capecitabine Fluorouracil Phenobarbital Phenytoin Primidone Trimethoprim;sulfamethoxazole This list may not describe all possible interactions. Give your health care provider a list of all the medicines, herbs, non-prescription drugs, or dietary supplements you use. Also tell them if you smoke, drink alcohol, or use illegal drugs. Some items may interact with  your medicine. What should I watch for while using this medication? Your condition will be monitored carefully while you are receiving this medication. This medication may increase the side effects of 5-fluorouracil. Tell your care team if you have diarrhea or mouth sores that do not get better or that get worse. What side effects may I notice from receiving this medication? Side effects that you should report to your care team as soon as possible: Allergic reactions--skin rash, itching, hives, swelling of the face, lips, tongue, or throat This list may not describe all possible side effects. Call your doctor for medical advice about side effects. You may report side effects to FDA at 1-800-FDA-1088. Where should I keep my medication? This medication is given in a hospital or  clinic. It will not be stored at home. NOTE: This sheet is a summary. It may not cover all possible information. If you have questions about this medicine, talk to your doctor, pharmacist, or health care provider.  2023 Elsevier/Gold Standard (2021-09-09 00:00:00)  Fluorouracil Injection What is this medication? FLUOROURACIL (flure oh YOOR a sil) treats some types of cancer. It works by slowing down the growth of cancer cells. This medicine may be used for other purposes; ask your health care provider or pharmacist if you have questions. COMMON BRAND NAME(S): Adrucil What should I tell my care team before I take this medication? They need to know if you have any of these conditions: Blood disorders Dihydropyrimidine dehydrogenase (DPD) deficiency Infection, such as chickenpox, cold sores, herpes Kidney disease Liver disease Poor nutrition Recent or ongoing radiation therapy An unusual or allergic reaction to fluorouracil, other medications, foods, dyes, or preservatives If you or your partner are pregnant or trying to get pregnant Breast-feeding How should I use this medication? This medication is injected into a vein. It is administered by your care team in a hospital or clinic setting. Talk to your care team about the use of this medication in children. Special care may be needed. Overdosage: If you think you have taken too much of this medicine contact a poison control center or emergency room at once. NOTE: This medicine is only for you. Do not share this medicine with others. What if I miss a dose? Keep appointments for follow-up doses. It is important not to miss your dose. Call your care team if you are unable to keep an appointment. What may interact with this medication? Do not take this medication with any of the following: Live virus vaccines This medication may also interact with the following: Medications that treat or prevent blood clots, such as warfarin, enoxaparin,  dalteparin This list may not describe all possible interactions. Give your health care provider a list of all the medicines, herbs, non-prescription drugs, or dietary supplements you use. Also tell them if you smoke, drink alcohol, or use illegal drugs. Some items may interact with your medicine. What should I watch for while using this medication? Your condition will be monitored carefully while you are receiving this medication. This medication may make you feel generally unwell. This is not uncommon as chemotherapy can affect healthy cells as well as cancer cells. Report any side effects. Continue your course of treatment even though you feel ill unless your care team tells you to stop. In some cases, you may be given additional medications to help with side effects. Follow all directions for their use. This medication may increase your risk of getting an infection. Call your care team for advice if you get  a fever, chills, sore throat, or other symptoms of a cold or flu. Do not treat yourself. Try to avoid being around people who are sick. This medication may increase your risk to bruise or bleed. Call your care team if you notice any unusual bleeding. Be careful brushing or flossing your teeth or using a toothpick because you may get an infection or bleed more easily. If you have any dental work done, tell your dentist you are receiving this medication. Avoid taking medications that contain aspirin, acetaminophen, ibuprofen, naproxen, or ketoprofen unless instructed by your care team. These medications may hide a fever. Do not treat diarrhea with over the counter products. Contact your care team if you have diarrhea that lasts more than 2 days or if it is severe and watery. This medication can make you more sensitive to the sun. Keep out of the sun. If you cannot avoid being in the sun, wear protective clothing and sunscreen. Do not use sun lamps, tanning beds, or tanning booths. Talk to your care  team if you or your partner wish to become pregnant or think you might be pregnant. This medication can cause serious birth defects if taken during pregnancy and for 3 months after the last dose. A reliable form of contraception is recommended while taking this medication and for 3 months after the last dose. Talk to your care team about effective forms of contraception. Do not father a child while taking this medication and for 3 months after the last dose. Use a condom while having sex during this time period. Do not breastfeed while taking this medication. This medication may cause infertility. Talk to your care team if you are concerned about your fertility. What side effects may I notice from receiving this medication? Side effects that you should report to your care team as soon as possible: Allergic reactions--skin rash, itching, hives, swelling of the face, lips, tongue, or throat Heart attack--pain or tightness in the chest, shoulders, arms, or jaw, nausea, shortness of breath, cold or clammy skin, feeling faint or lightheaded Heart failure--shortness of breath, swelling of the ankles, feet, or hands, sudden weight gain, unusual weakness or fatigue Heart rhythm changes--fast or irregular heartbeat, dizziness, feeling faint or lightheaded, chest pain, trouble breathing High ammonia level--unusual weakness or fatigue, confusion, loss of appetite, nausea, vomiting, seizures Infection--fever, chills, cough, sore throat, wounds that don't heal, pain or trouble when passing urine, general feeling of discomfort or being unwell Low red blood cell level--unusual weakness or fatigue, dizziness, headache, trouble breathing Pain, tingling, or numbness in the hands or feet, muscle weakness, change in vision, confusion or trouble speaking, loss of balance or coordination, trouble walking, seizures Redness, swelling, and blistering of the skin over hands and feet Severe or prolonged diarrhea Unusual  bruising or bleeding Side effects that usually do not require medical attention (report to your care team if they continue or are bothersome): Dry skin Headache Increased tears Nausea Pain, redness, or swelling with sores inside the mouth or throat Sensitivity to light Vomiting This list may not describe all possible side effects. Call your doctor for medical advice about side effects. You may report side effects to FDA at 1-800-FDA-1088. Where should I keep my medication? This medication is given in a hospital or clinic. It will not be stored at home. NOTE: This sheet is a summary. It may not cover all possible information. If you have questions about this medicine, talk to your doctor, pharmacist, or health care provider.  2023  Elsevier/Gold Standard (2021-09-06 00:00:00)

## 2022-01-17 NOTE — Assessment & Plan Note (Signed)
Patient has gabapentin supply at home.Patient declines gabapentin and acupuncture.  

## 2022-01-17 NOTE — Assessment & Plan Note (Addendum)
Overall he tolerates chemotherapy.   CEA is relatively stable with small fluctuations. Labs reviewed and discussed with patient Proceed with 5-FU/bevacizumab treatment today Continue current regimen. 

## 2022-01-17 NOTE — Assessment & Plan Note (Signed)
continue to monitor.  Hemoglobin is close to baseline.

## 2022-01-17 NOTE — Progress Notes (Signed)
Patient here for oncology follow-up appointment, expresses no new concerns at this time.    

## 2022-01-17 NOTE — Assessment & Plan Note (Signed)
Chemotherapy plan as listed above 

## 2022-01-17 NOTE — Progress Notes (Signed)
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   CEA is relatively stable with small fluctuations. Labs reviewed and discussed with patient Proceed with 5-FU/bevacizumab treatment today Continue current regimen.  Encounter for antineoplastic chemotherapy Chemotherapy plan as listed above.   Chemotherapy-induced neuropathy ([place]) Patient has gabapentin supply at home.Patient declines gabapentin and acupuncture.   Anemia due to chemotherapy continue to monitor.  Hemoglobin is close to baseline.  Weight loss, refer to nutritionist   Follow-up 2 weeks lab MD 5-FU/bevacizumab -   All questions were answered. The patient knows to call the clinic with any problems, questions or concerns.  Jared Server, MD, PhD [facility] Hematology Oncology 01/17/2022   Chief Complaint: Jared Gutierrez is a 81 y.o. male presents for follow-up of metastatic colon cancer   PERTINENT ONCOLOGY HISTORY Jared Gutierrez is a 81 y.o.[place] who has above oncology history reviewed by me today presented for follow up visit for management of Metastatic colon cancer. [name], patient switched care to me on 11/11/20 Extensive medical record review was performed by me  Oncology History  History of colon cancer, stage I  10/02/2014 Pathology Results   [id number] colonoscopy pathology A. CECAL POLYPS; HOT SNARE AND COLD BIOPSY:  - TUBULAR ADENOMA, MULTIPLE FRAGMENTS.  - NEGATIVE FOR HIGH-GRADE DYSPLASIA AND MALIGNANCY.   B. COLON POLYP, TRANSVERSE; COLD BIOPSY:  - TUBULAR ADENOMA.  - NEGATIVE FOR HIGH-GRADE DYSPLASIA AND MALIGNANCY.      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  Tumor extended into the submucosa. Margins were negative. + lymphovascular invasion. 14 lymph nodes were negative. Pathologic stage was T1 N0.  TRANSVERSE COLON, RESECTION:  - INVASIVE ADENOCARCINOMA ARISING IN A 4.6 CM TUBULOVILLOUS  ADENOMA WITH HIGH GRADE DYSPLASIA (MALIGNANT POLYP).  - SEE SUMMARY BELOW.  [name]  ONCOLOGY SUMMARY: COLON AND RECTUM, RESECTION, AJCC 7TH EDITION  Specimen: transverse colon  Procedure: transverse colon resection  Tumor site: splenic flexure  Tumor size: 1.0 cm (invasive component)  Macroscopic tumor perforation: not specified  Histologic type: invasive adenocarcinoma  Histologic grade: moderately differentiated  Microscopic tumor extension: into submucosa  Margins:       Proximal margin: negative       Distal margin: negative       Circumferential (radial) or mesenteric margin: negative       If all margins uninvolved  by invasive carcinoma:       Distance of invasive carcinoma from closest margin: 7.5 cm  to distal margin  Treatment effect: not applicable  Lymph-vascular invasion: present  Perineural invasion: not identified  Tumor deposits  (discontinuous extramural extension): not  identified  Pathologic staging:       Primary tumor: pT1       Regional lymph nodes: pN0            Number of nodes examined: 14            Number of nodes involved: 0    04/02/2019 Progression   04/02/2019  PET scan  limited evealed a 2.4 x 2.3 cm (SUV 11) hypermetabolic soft tissue density caudal and anterior to the pancreatic neck favored to represent isolated peritoneal or nodal metastasis in the setting of prior transverse colonic resection (expected primary drainage). Although this was immediately adjacent to the pancreas, a fat plane was maintained, arguing strongly against a pancreatic primary. Otherwise, there was no evidence of hypermetabolic metastasis. 04/17/2019 EUS on  revealed a normal esophagus, stomach, duodenum, and pancreas.  There was a 2.4 x 2.4 cm irregular mass in the retroperitoneum adjacent to, but not involving the pancreatic neck.  FNA and core needle biopsy were performed.  Pathology revealed adenocarcinoma compatible with a metastatic lesion of colorectal origin.  Tumor cells were positive for CK20 and CDX2 and negative for CK7.  NGS: [employer] on 05/15/2019 revealed + KRASG13D and TP53.  Negative results included BRAF V600E, Her2, NRAS, NTRK, PD-L1 (<1%), and TMB 8.7/Mb (intermediate).  MMR testing from his colon resection on 10/16/2013 was intact with a low probability of MSI-H.   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  He received Neulasta after cycle #4 and #5 secondary to progressive leukopenia.  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  He received a truncated course of FOLFOX with cycle #11 secondary to oxaliplatin reaction.   01/14/2020 Imaging    PET revealed an interval decrease in size and FDG uptake (2.4 x 2.3 cm with SUV 11 to 2.4 x 1.7 cm with SUV 4.09)  associated with the previously referenced soft tissue density caudal and anterior to the pancreatic neck suggesting treatment response. There were no new sites of FDG avid tumor.   08/31/2020 Imaging   08/31/2020 Abdomen and pelvis CT revealed increased size of the index soft tissue lesion inferior and anterior to the pancreatic neck (2.9 x 1.8 cm to 3.5 x 2.9 cm). There were similar prominent retroperitoneal lymph nodes without adenopathy by size criteria. There was no new or enlarging abdominal or pelvic lymph nodes. There were no new interval findings. There was similar circumferential wall thickening of a nondistended urinary bladder, which likely accentuated wall thickening There was hepatic steatosis and aortic atherosclerosis.   11/03/2020, PET showed recurrent peritoneal metastasis in the upper abdomen adjacent to the pancreas.  The lesion is 3.8 x 2.9 cm with SUV of 11.3. No evidence of metastatic peritoneal disease elsewhere in the abdomen pelvis.  No evidence of distant metastasis.   11/03/2020 Imaging   PET scan showed recurrent peritoneal metastasis near pancreas. Given that he has no other distant metastasis. Discussed with radiation oncology. Repeat SBRT may be considered if he does not respond well to systemic chemotherapy.     11/26/2020 -  Chemotherapy  5-FU and bevacizumab.   02/17/2021 Imaging   02/17/2021 CT abdomen pelvis showed partial response. Mild decrease of peritoneal lesion.  Proceed with 5-FU and bevacizumab today.  Given that he is tolerating current regimen with good life quality, partial response.  Shared decision was made to hold off adding additional chemotherapy agents.   06/08/2021, CT chest abdomen pelvis with contrast showed soft tissue mass at the base of mesentery inferior to the pancreatic neck is unchanged in size.  No progressive disease was identified.   09/07/2021, CT chest abdomen pelvis with contrast showed no substantial changes in size of the  mesenteric mass, 2.6 cm.  No suspicious new lesions.  Chronic findings as detailed in the imaging report.   12/05/2021 Imaging   PET scan showed soft tissue mesenteric mass is unchanged in size.  Decreased FDG uptake compared to activity on November 03, 2020.  Fever treated disease.  No new metastatic disease.      Other medical problems Chronic lower extremity edema. 12/24/2018, right lower extremity duplex negative for DVT.  Small right Baker's cyst. 08/16/2019, bilateral lower extremity duplex showed no DVT. 08/16/2019 - 08/17/2019 with right lower extremity cellulitis.  He was unable to bear weight.  He was treated with IV fluids, NSAIDs, colchicine, and broad antibiotics (vancomycin and Cefepime).  He was discharged on indomethacin x 5 days and Keflex 500 mg TID x 5 days.  10/05/2020, colonoscopy showed internal hemorrhoids.  Otherwise normal examination.  INTERVAL HISTORY SAEL FURCHES is a 81 y.o. male who has above history reviewed by me today presents for follow up visit for management of recurrent metastatic colon cancer Patient was accompanied by wife.   Denies fever, chills, nausea, vomiting, diarrhea, chest pain, shortness of breath, abdominal pain, urinary symptoms.  He has gained weight, no leg swelling. No new complaints.    Review of Systems  Constitutional:  Negative for appetite change, chills, diaphoresis, fever and unexpected weight change.  HENT:   Negative for hearing loss, nosebleeds, sore throat and tinnitus.   Respiratory:  Negative for cough, hemoptysis and shortness of breath.   Cardiovascular:  Negative for chest pain and palpitations.  Gastrointestinal:  Negative for abdominal pain, blood in stool, constipation, diarrhea, nausea and vomiting.  Genitourinary:  Negative for dysuria, frequency and hematuria.   Musculoskeletal:  Positive for arthralgias. Negative for back pain, myalgias and neck pain.  Skin:  Negative for itching and rash.  Neurological:  Positive  for numbness. Negative for dizziness and headaches.  Hematological:  Does not bruise/bleed easily.  Psychiatric/Behavioral:  Negative for depression. The patient is not nervous/anxious.       Past Medical History:  Diagnosis Date   Arthritis    OSTEOARTHRITIS   Cancer ([place])    Cavitary lesion of lung    RIGHT LOWER LOBE   Chicken pox    Colon cancer ([facility])    History of kidney stones    Hypertension    Lipoma of colon    Nephrolithiasis    Nephrolithiasis    Obesity    Shingles    Tubular adenoma of colon    multiple fragments    Past Surgical History:  Procedure Laterality Date   COLON SURGERY     COLONOSCOPY N/A 10/02/2014   Procedure: COLONOSCOPY;  Surgeon: Josefine Class, MD;  Location: [facility] ENDOSCOPY;  Service: Endoscopy;  Laterality: N/A;   COLONOSCOPY WITH PROPOFOL N/A 02/16/2017   Procedure: COLONOSCOPY WITH PROPOFOL;  Surgeon: Manya Silvas, MD;  Location: [facility]  ENDOSCOPY;  Service: Endoscopy;  Laterality: N/A;   COLONOSCOPY WITH PROPOFOL N/A 10/05/2020   Procedure: COLONOSCOPY WITH PROPOFOL;  Surgeon: Lesly Rubenstein, MD;  Location: [facility] ENDOSCOPY;  Service: Endoscopy;  Laterality: N/A;   EUS N/A 04/17/2019   Procedure: FULL UPPER ENDOSCOPIC ULTRASOUND (EUS) RADIAL;  Surgeon: Holly Bodily, MD;  Location: [facility] ENDOSCOPY;  Service: Gastroenterology;  Laterality: N/A;   KIDNEY STONE SURGERY     PARTIAL COLECTOMY  10/17/2013   PORTACATH PLACEMENT Right 06/13/2019   Procedure: INSERTION PORT-A-CATH;  Surgeon: Benjamine Sprague, DO;  Location: [facility] ORS;  Service: General;  Laterality: Right;    Family History  Problem Relation Age of Onset   Cancer Mother    Breast cancer Mother    COPD Father     Social History:  reports that he has never smoked. He has never used smokeless tobacco. He reports current alcohol use. He reports that he does not use drugs.    Allergies: No Known Allergies  Current Medications: Current Outpatient Medications   Medication Sig Dispense Refill   acetaminophen (TYLENOL) 500 MG tablet Take 500 mg by mouth every 6 (six) hours as needed.     amLODipine (NORVASC) 2.5 MG tablet Take 2.5 mg by mouth daily.     aspirin EC 81 MG tablet Take 81 mg by mouth daily.     Calcium Carbonate (CALCIUM 600 PO) Take 1 tablet by mouth 2 (two) times daily.     Cholecalciferol (VITAMIN D) 125 MCG (5000 UT) CAPS Take 5,000 mg by mouth.     docusate sodium (COLACE) 100 MG capsule Take 1 capsule (100 mg total) by mouth 2 (two) times daily as needed for mild constipation or moderate constipation. 60 capsule 3   gabapentin (NEURONTIN) 100 MG capsule Take 1 capsule (100 mg total) by mouth 3 (three) times daily. 90 capsule 0   HYDROcodone-acetaminophen (NORCO/VICODIN) 5-325 MG tablet Take 1 tablet by mouth every 6 (six) hours as needed for moderate pain (up to 3 doses for moderate pain.).     KLOR-CON M20 20 MEQ tablet TAKE 1 TABLET BY MOUTH TWICE A DAY 60 tablet 0   lidocaine-prilocaine (EMLA) cream APPLY TO AFFECTED AREA ONCE AS DIRECTED 30 g 3   loperamide (IMODIUM) 2 MG capsule Take 1 capsule (2 mg total) by mouth See admin instructions. Take 2 tablets after first loose stool,  then 1 tablet  after each loose stool; maximum: 8 tablets /day 60 capsule 0   loratadine (CLARITIN) 10 MG tablet Take 10 mg by mouth daily.     olmesartan (BENICAR) 20 MG tablet Take 1 tablet by mouth daily.     ondansetron (ZOFRAN) 8 MG tablet Take 1 tablet (8 mg total) by mouth 2 (two) times daily as needed for refractory nausea / vomiting. Start on day 3 after chemotherapy. 60 tablet 1   Probiotic Product (PROBIOTIC DAILY PO) Take 1 tablet by mouth daily.     prochlorperazine (COMPAZINE) 10 MG tablet Take 1 tablet (10 mg total) by mouth every 6 (six) hours as needed (NAUSEA). 30 tablet 1   No current facility-administered medications for this visit.   Facility-Administered Medications Ordered in Other Visits  Medication Dose Route Frequency Provider  Last Rate Last Admin   0.9 %  sodium chloride infusion   Intravenous Once Corcoran, Melissa C, MD       0.9 %  sodium chloride infusion   Intravenous Continuous Lequita Asal, MD 10 mL/hr at 12/31/19 1000 New Bag  at 10/05/20 1045   fluorouracil (ADRUCIL) 5,500 mg in sodium chloride 0.9 % 140 mL chemo infusion  2,400 mg/m2 (Treatment Plan Recorded) Intravenous 1 day or 1 dose Jared Server, MD   Infusion Verify at 01/17/22 1259   heparin lock flush 100 unit/mL  500 Units Intravenous Once Lequita Asal, MD        Performance status (ECOG): 1  Vitals Blood pressure 120/79, pulse 65, temperature 98.8 F (37.1 C), resp. rate 18, weight 244 lb (110.7 kg), SpO2 100 %.   Physical Exam Vitals and nursing note reviewed.  Constitutional:      General: He is not in acute distress.    Appearance: He is well-developed. He is obese. He is not diaphoretic.     Interventions: Face mask in place.  HENT:     Head: Normocephalic and atraumatic.  Eyes:     General: No scleral icterus.    Extraocular Movements: Extraocular movements intact.     Conjunctiva/sclera: Conjunctivae normal.     Pupils: Pupils are equal, round, and reactive to light.  Cardiovascular:     Rate and Rhythm: Normal rate and regular rhythm.     Heart sounds: Normal heart sounds. No murmur heard. Pulmonary:     Effort: Pulmonary effort is normal. No respiratory distress.     Breath sounds: No wheezing.     Comments: Decreased breath sound bilaterally Abdominal:     General: There is no distension.     Palpations: Abdomen is soft. There is no mass.     Tenderness: There is no abdominal tenderness.  Musculoskeletal:        General: No swelling or tenderness. Normal range of motion.     Cervical back: Normal range of motion and neck supple.     Comments: Trace edema bilaterally.   Lymphadenopathy:     Head:     Right side of head: No preauricular, posterior auricular or occipital adenopathy.     Left side of head: No  preauricular, posterior auricular or occipital adenopathy.     Cervical: No cervical adenopathy.     Upper Body:     Right upper body: No supraclavicular or axillary adenopathy.     Left upper body: No supraclavicular or axillary adenopathy.     Lower Body: No right inguinal adenopathy. No left inguinal adenopathy.  Skin:    General: Skin is warm and dry.  Neurological:     Mental Status: He is alert and oriented to person, place, and time. Mental status is at baseline.  Psychiatric:        Mood and Affect: Mood normal.    Labs were reviewed by me.     Latest Ref Rng & Units 01/17/2022    9:33 AM 01/03/2022    8:29 AM 12/20/2021    8:52 AM  CBC  WBC 4.0 - 10.5 K/uL 4.5  4.3  4.6   Hemoglobin 13.0 - 17.0 g/dL 11.6  12.0  11.9   Hematocrit 39.0 - 52.0 % 35.2  36.7  35.9   Platelets 150 - 400 K/uL 158  171  164       Latest Ref Rng & Units 01/17/2022    9:33 AM 01/03/2022    8:29 AM 12/20/2021    8:52 AM  CMP  Glucose 70 - 99 mg/dL 91  109  109   BUN 8 - 23 mg/dL _0 Creatinine 0.61 - 1.24 mg/dL 0.62  0.72  0.82  Sodium 135 - 145 mmol/L 137  136  137   Potassium 3.5 - 5.1 mmol/L 3.7  3.8  4.0   Chloride 98 - 111 mmol/L 103  106  105   CO2 22 - 32 mmol/L _0 Calcium 8.9 - 10.3 mg/dL 9.1  9.1  9.7   Total Protein 6.5 - 8.1 g/dL 6.5  6.3  6.3   Total Bilirubin 0.3 - 1.2 mg/dL 1.1  1.3  0.9   Alkaline Phos 38 - 126 U/L 34  36  36   AST 15 - 41 U/L 31  36  39   ALT 0 - 44 U/L 20  20  24

## 2022-01-18 ENCOUNTER — Other Ambulatory Visit: Payer: Self-pay

## 2022-01-18 LAB — CEA: CEA: 11.5 ng/mL — ABNORMAL HIGH (ref 0.0–4.7)

## 2022-01-19 ENCOUNTER — Inpatient Hospital Stay: Payer: Medicare Other

## 2022-01-19 VITALS — BP 161/75 | HR 70 | Resp 18

## 2022-01-19 DIAGNOSIS — Z85038 Personal history of other malignant neoplasm of large intestine: Secondary | ICD-10-CM

## 2022-01-19 DIAGNOSIS — C786 Secondary malignant neoplasm of retroperitoneum and peritoneum: Secondary | ICD-10-CM

## 2022-01-19 DIAGNOSIS — Z5112 Encounter for antineoplastic immunotherapy: Secondary | ICD-10-CM | POA: Diagnosis not present

## 2022-01-19 MED ORDER — SODIUM CHLORIDE 0.9% FLUSH
10.0000 mL | INTRAVENOUS | Status: DC | PRN
  Administered 2022-01-19: 10 mL
  Filled 2022-01-19: qty 10

## 2022-01-19 MED ORDER — HEPARIN SOD (PORK) LOCK FLUSH 100 UNIT/ML IV SOLN
500.0000 [IU] | Freq: Once | INTRAVENOUS | Status: AC | PRN
  Administered 2022-01-19: 500 [IU]
  Filled 2022-01-19: qty 5

## 2022-01-22 ENCOUNTER — Other Ambulatory Visit: Payer: Self-pay | Admitting: Oncology

## 2022-01-23 ENCOUNTER — Encounter: Payer: Self-pay | Admitting: Oncology

## 2022-01-23 NOTE — Telephone Encounter (Signed)
Component Ref Range & Units 6 d ago (01/17/22) 2 wk ago (01/03/22) 1 mo ago (12/20/21) 1 mo ago (12/06/21) 2 mo ago (11/22/21) 2 mo ago (11/08/21) 2 mo ago (10/26/21)  Potassium 3.5 - 5.1 mmol/L 3.7  3.8  4.0  3.2 Low

## 2022-01-30 MED FILL — Dexamethasone Sodium Phosphate Inj 100 MG/10ML: INTRAMUSCULAR | Qty: 1 | Status: AC

## 2022-01-31 ENCOUNTER — Inpatient Hospital Stay: Payer: Medicare Other

## 2022-01-31 ENCOUNTER — Encounter: Payer: Self-pay | Admitting: Oncology

## 2022-01-31 ENCOUNTER — Inpatient Hospital Stay (HOSPITAL_BASED_OUTPATIENT_CLINIC_OR_DEPARTMENT_OTHER): Payer: Medicare Other | Admitting: Oncology

## 2022-01-31 DIAGNOSIS — C184 Malignant neoplasm of transverse colon: Secondary | ICD-10-CM

## 2022-01-31 DIAGNOSIS — G620 Drug-induced polyneuropathy: Secondary | ICD-10-CM | POA: Diagnosis not present

## 2022-01-31 DIAGNOSIS — C786 Secondary malignant neoplasm of retroperitoneum and peritoneum: Secondary | ICD-10-CM

## 2022-01-31 DIAGNOSIS — Z85038 Personal history of other malignant neoplasm of large intestine: Secondary | ICD-10-CM

## 2022-01-31 DIAGNOSIS — Z5111 Encounter for antineoplastic chemotherapy: Secondary | ICD-10-CM

## 2022-01-31 DIAGNOSIS — T451X5D Adverse effect of antineoplastic and immunosuppressive drugs, subsequent encounter: Secondary | ICD-10-CM

## 2022-01-31 DIAGNOSIS — D6481 Anemia due to antineoplastic chemotherapy: Secondary | ICD-10-CM

## 2022-01-31 DIAGNOSIS — Z5112 Encounter for antineoplastic immunotherapy: Secondary | ICD-10-CM | POA: Diagnosis not present

## 2022-01-31 LAB — CBC WITH DIFFERENTIAL/PLATELET
Abs Immature Granulocytes: 0 10*3/uL (ref 0.00–0.07)
Basophils Absolute: 0 10*3/uL (ref 0.0–0.1)
Basophils Relative: 1 %
Eosinophils Absolute: 0.1 10*3/uL (ref 0.0–0.5)
Eosinophils Relative: 2 %
HCT: 35.9 % — ABNORMAL LOW (ref 39.0–52.0)
Hemoglobin: 12 g/dL — ABNORMAL LOW (ref 13.0–17.0)
Immature Granulocytes: 0 %
Lymphocytes Relative: 33 %
Lymphs Abs: 1.6 10*3/uL (ref 0.7–4.0)
MCH: 31.7 pg (ref 26.0–34.0)
MCHC: 33.4 g/dL (ref 30.0–36.0)
MCV: 95 fL (ref 80.0–100.0)
Monocytes Absolute: 0.4 10*3/uL (ref 0.1–1.0)
Monocytes Relative: 10 %
Neutro Abs: 2.5 10*3/uL (ref 1.7–7.7)
Neutrophils Relative %: 54 %
Platelets: 152 10*3/uL (ref 150–400)
RBC: 3.78 MIL/uL — ABNORMAL LOW (ref 4.22–5.81)
RDW: 14.6 % (ref 11.5–15.5)
WBC: 4.7 10*3/uL (ref 4.0–10.5)
nRBC: 0 % (ref 0.0–0.2)

## 2022-01-31 LAB — COMPREHENSIVE METABOLIC PANEL
ALT: 21 U/L (ref 0–44)
AST: 36 U/L (ref 15–41)
Albumin: 3.5 g/dL (ref 3.5–5.0)
Alkaline Phosphatase: 37 U/L — ABNORMAL LOW (ref 38–126)
Anion gap: 3 — ABNORMAL LOW (ref 5–15)
BUN: 12 mg/dL (ref 8–23)
CO2: 25 mmol/L (ref 22–32)
Calcium: 9.1 mg/dL (ref 8.9–10.3)
Chloride: 106 mmol/L (ref 98–111)
Creatinine, Ser: 0.72 mg/dL (ref 0.61–1.24)
GFR, Estimated: >60 mL/min (ref >60)
Glucose, Bld: 114 mg/dL — ABNORMAL HIGH (ref 70–99)
Potassium: 3.7 mmol/L (ref 3.5–5.1)
Sodium: 134 mmol/L — ABNORMAL LOW (ref 135–145)
Total Bilirubin: 1.1 mg/dL (ref 0.3–1.2)
Total Protein: 6.6 g/dL (ref 6.5–8.1)

## 2022-01-31 LAB — PROTEIN, URINE, RANDOM: Total Protein, Urine: 14 mg/dL

## 2022-01-31 MED ORDER — SODIUM CHLORIDE 0.9 % IV SOLN
Freq: Once | INTRAVENOUS | Status: AC
  Filled 2022-01-31: qty 250

## 2022-01-31 MED ORDER — SODIUM CHLORIDE 0.9 % IV SOLN
10.0000 mg | Freq: Once | INTRAVENOUS | Status: AC
  Administered 2022-01-31: 10 mg via INTRAVENOUS
  Filled 2022-01-31: qty 10

## 2022-01-31 MED ORDER — FLUOROURACIL CHEMO INJECTION 2.5 GM/50ML
400.0000 mg/m2 | Freq: Once | INTRAVENOUS | Status: AC
  Administered 2022-01-31: 900 mg via INTRAVENOUS
  Filled 2022-01-31: qty 18

## 2022-01-31 MED ORDER — SODIUM CHLORIDE 0.9 % IV SOLN
600.0000 mg | Freq: Once | INTRAVENOUS | Status: AC
  Administered 2022-01-31: 600 mg via INTRAVENOUS
  Filled 2022-01-31: qty 16

## 2022-01-31 MED ORDER — SODIUM CHLORIDE 0.9% FLUSH
10.0000 mL | INTRAVENOUS | Status: DC | PRN
  Filled 2022-01-31: qty 10

## 2022-01-31 MED ORDER — SODIUM CHLORIDE 0.9 % IV SOLN
900.0000 mg | Freq: Once | INTRAVENOUS | Status: AC
  Administered 2022-01-31: 900 mg via INTRAVENOUS
  Filled 2022-01-31: qty 45

## 2022-01-31 MED ORDER — SODIUM CHLORIDE 0.9 % IV SOLN
2400.0000 mg/m2 | INTRAVENOUS | Status: DC
  Administered 2022-01-31: 5500 mg via INTRAVENOUS
  Filled 2022-01-31: qty 110

## 2022-01-31 NOTE — Progress Notes (Signed)
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   CEA is relatively stable with small fluctuations. Labs reviewed and discussed with patient Proceed with 5-FU/bevacizumab treatment today Continue current regimen.  Encounter for antineoplastic chemotherapy Chemotherapy plan as listed above.   Anemia due to chemotherapy continue to monitor.  Hemoglobin is close to baseline.  Chemotherapy-induced neuropathy ([place]) Patient has gabapentin supply at home.Patient declines gabapentin and acupuncture.    Follow-up 2 weeks lab MD 5-FU/bevacizumab -   All questions were answered. The patient knows to call the clinic with any problems, questions or concerns.  [name], MD, PhD [facility] Hematology Oncology 01/31/2022   Chief Complaint: Jared Gutierrez is a 81 y.o. male presents for follow-up of metastatic colon cancer   PERTINENT ONCOLOGY HISTORY Jared Gutierrez is a 81 y.o.[place] who has above oncology history reviewed by me today presented for follow up visit for management of Metastatic colon cancer. [name], patient switched care to me on 11/11/20 Extensive medical record review was performed by me  Oncology History  History of colon cancer, stage I  10/02/2014 Pathology Results   [id number] colonoscopy pathology A. CECAL POLYPS; HOT SNARE AND COLD BIOPSY:  - TUBULAR ADENOMA, MULTIPLE FRAGMENTS.  - NEGATIVE FOR HIGH-GRADE DYSPLASIA AND MALIGNANCY.   B. COLON POLYP, TRANSVERSE; COLD BIOPSY:  - TUBULAR ADENOMA.  - NEGATIVE FOR HIGH-GRADE DYSPLASIA AND MALIGNANCY.     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  Tumor extended into the submucosa. Margins were negative. + lymphovascular invasion. 14 lymph nodes were negative. Pathologic stage was T1 N0.  TRANSVERSE COLON, RESECTION:  - INVASIVE ADENOCARCINOMA ARISING IN A 4.6 CM TUBULOVILLOUS  ADENOMA WITH HIGH GRADE DYSPLASIA (MALIGNANT POLYP).  - SEE SUMMARY BELOW.  [name]  ONCOLOGY SUMMARY: COLON AND RECTUM, RESECTION, AJCC 7TH EDITION  Specimen: transverse colon  Procedure: transverse colon resection  Tumor site: splenic flexure  Tumor size: 1.0 cm (invasive component)  Macroscopic tumor perforation: not specified  Histologic type: invasive adenocarcinoma  Histologic grade: moderately differentiated  Microscopic tumor extension: into submucosa  Margins:       Proximal margin: negative       Distal margin: negative       Circumferential (radial) or mesenteric margin: negative       If all margins uninvolved by invasive carcinoma:  Distance of invasive carcinoma from closest margin: 7.5 cm  to distal margin  Treatment effect: not applicable  Lymph-vascular invasion: present  Perineural invasion: not identified  Tumor deposits (discontinuous extramural extension): not   identified  Pathologic staging:       Primary tumor: pT1       Regional lymph nodes: pN0            Number of nodes examined: 14            Number of nodes involved: 0    04/02/2019 Progression   04/02/2019  PET scan  limited evealed a 2.4 x 2.3 cm (SUV 11) hypermetabolic soft tissue density caudal and anterior to the pancreatic neck favored to represent isolated peritoneal or nodal metastasis in the setting of prior transverse colonic resection (expected primary drainage). Although this was immediately adjacent to the pancreas, a fat plane was maintained, arguing strongly against a pancreatic primary. Otherwise, there was no evidence of hypermetabolic metastasis. 04/17/2019 EUS on  revealed a normal esophagus, stomach, duodenum, and pancreas.  There was a 2.4 x 2.4 cm irregular mass in the retroperitoneum adjacent to, but not involving the pancreatic neck.  FNA and core needle biopsy were performed.  Pathology revealed adenocarcinoma compatible with a metastatic lesion of colorectal origin.  Tumor cells were positive for CK20 and CDX2 and negative for CK7.  NGS: [employer] on 05/15/2019 revealed + KRASG13D and TP53.  Negative results included BRAF V600E, Her2, NRAS, NTRK, PD-L1 (<1%), and TMB 8.7/Mb (intermediate).  MMR testing from his colon resection on 10/16/2013 was intact with a low probability of MSI-H.   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  He received Neulasta after cycle #4 and #5 secondary to progressive leukopenia.  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  He received a truncated course of FOLFOX with cycle #11 secondary to oxaliplatin reaction.   01/14/2020 Imaging    PET revealed an interval decrease in size and FDG uptake (2.4 x 2.3 cm with SUV 11 to 2.4 x 1.7 cm with SUV 4.09) associated with the previously referenced  soft tissue density caudal and anterior to the pancreatic neck suggesting treatment response. There were no new sites of FDG avid tumor.   08/31/2020 Imaging   08/31/2020 Abdomen and pelvis CT revealed increased size of the index soft tissue lesion inferior and anterior to the pancreatic neck (2.9 x 1.8 cm to 3.5 x 2.9 cm). There were similar prominent retroperitoneal lymph nodes without adenopathy by size criteria. There was no new or enlarging abdominal or pelvic lymph nodes. There were no new interval findings. There was similar circumferential wall thickening of a nondistended urinary bladder, which likely accentuated wall thickening There was hepatic steatosis and aortic atherosclerosis.   11/03/2020, PET showed recurrent peritoneal metastasis in the upper abdomen adjacent to the pancreas.  The lesion is 3.8 x 2.9 cm with SUV of 11.3. No evidence of metastatic peritoneal disease elsewhere in the abdomen pelvis.  No evidence of distant metastasis.   11/03/2020 Imaging   PET scan showed recurrent peritoneal metastasis near pancreas. Given that he has no other distant metastasis. Discussed with radiation oncology. Repeat SBRT may be considered if he does not respond well to systemic chemotherapy.     11/26/2020 -  Chemotherapy   5-FU and bevacizumab.   02/17/2021 Imaging  02/17/2021 CT abdomen pelvis showed partial response. Mild decrease of peritoneal lesion.  Proceed with 5-FU and bevacizumab today.  Given that he is tolerating current regimen with good life quality, partial response.  Shared decision was made to hold off adding additional chemotherapy agents.   06/08/2021, CT chest abdomen pelvis with contrast showed soft tissue mass at the base of mesentery inferior to the pancreatic neck is unchanged in size.  No progressive disease was identified.   09/07/2021, CT chest abdomen pelvis with contrast showed no substantial changes in size of the mesenteric mass, 2.6 cm.  No suspicious new  lesions.  Chronic findings as detailed in the imaging report.   12/05/2021 Imaging   PET scan showed soft tissue mesenteric mass is unchanged in size.  Decreased FDG uptake compared to activity on November 03, 2020.  Fever treated disease.  No new metastatic disease.      Other medical problems Chronic lower extremity edema. 12/24/2018, right lower extremity duplex negative for DVT.  Small right Baker's cyst. 08/16/2019, bilateral lower extremity duplex showed no DVT. 08/16/2019 - 08/17/2019 with right lower extremity cellulitis.  He was unable to bear weight.  He was treated with IV fluids, NSAIDs, colchicine, and broad antibiotics (vancomycin and Cefepime).  He was discharged on indomethacin x 5 days and Keflex 500 mg TID x 5 days.  10/05/2020, colonoscopy showed internal hemorrhoids.  Otherwise normal examination.  INTERVAL HISTORY Jared Gutierrez is a 81 y.o. male who has above history reviewed by me today presents for follow up visit for management of recurrent metastatic colon cancer Patient was accompanied by wife.   Denies fever, chills, nausea, vomiting, diarrhea, chest pain, shortness of breath, abdominal pain, urinary symptoms.  Patient reports no new complaints.    Review of Systems  Constitutional:  Negative for appetite change, chills, diaphoresis, fever and unexpected weight change.  HENT:   Negative for hearing loss, nosebleeds, sore throat and tinnitus.   Respiratory:  Negative for cough, hemoptysis and shortness of breath.   Cardiovascular:  Negative for chest pain and palpitations.  Gastrointestinal:  Negative for abdominal pain, blood in stool, constipation, diarrhea, nausea and vomiting.  Genitourinary:  Negative for dysuria, frequency and hematuria.   Musculoskeletal:  Positive for arthralgias. Negative for back pain, myalgias and neck pain.  Skin:  Negative for itching and rash.  Neurological:  Positive for numbness. Negative for dizziness and headaches.  Hematological:   Does not bruise/bleed easily.  Psychiatric/Behavioral:  Negative for depression. The patient is not nervous/anxious.       Past Medical History:  Diagnosis Date   Arthritis    OSTEOARTHRITIS   Cancer ([place])    Cavitary lesion of lung    RIGHT LOWER LOBE   Chicken pox    Colon cancer ([facility])    History of kidney stones    Hypertension    Lipoma of colon    Nephrolithiasis    Nephrolithiasis    Obesity    Shingles    Tubular adenoma of colon    multiple fragments    Past Surgical History:  Procedure Laterality Date   COLON SURGERY     COLONOSCOPY N/A 10/02/2014   Procedure: COLONOSCOPY;  Surgeon: Josefine Class, MD;  Location: [facility] ENDOSCOPY;  Service: Endoscopy;  Laterality: N/A;   COLONOSCOPY WITH PROPOFOL N/A 02/16/2017   Procedure: COLONOSCOPY WITH PROPOFOL;  Surgeon: Manya Silvas, MD;  Location: [facility] ENDOSCOPY;  Service: Endoscopy;  Laterality: N/A;   COLONOSCOPY WITH PROPOFOL N/A 10/05/2020  Procedure: COLONOSCOPY WITH PROPOFOL;  Surgeon: Lesly Rubenstein, MD;  Location[facility] ENDOSCOPY;  Service: Endoscopy;  Laterality: N/A;   EUS N/A 04/17/2019   Procedure: FULL UPPER ENDOSCOPIC ULTRASOUND (EUS) RADIAL;  Surgeon: Holly Bodily, MD;  Location: [facility] ENDOSCOPY;  Service: Gastroenterology;  Laterality: N/A;   KIDNEY STONE SURGERY     PARTIAL COLECTOMY  10/17/2013   PORTACATH PLACEMENT Right 06/13/2019   Procedure: INSERTION PORT-A-CATH;  Surgeon: Benjamine Sprague, DO;  Location: [facility] ORS;  Service: General;  Laterality: Right;    Family History  Problem Relation Age of Onset   Cancer Mother    Breast cancer Mother    COPD Father     Social History:  reports that he has never smoked. He has never used smokeless tobacco. He reports current alcohol use. He reports that he does not use drugs.    Allergies: No Known Allergies  Current Medications: Current Outpatient Medications  Medication Sig Dispense Refill   acetaminophen (TYLENOL) 500 MG tablet Take  500 mg by mouth every 6 (six) hours as needed.     amLODipine (NORVASC) 2.5 MG tablet Take 2.5 mg by mouth daily.     aspirin EC 81 MG tablet Take 81 mg by mouth daily.     Calcium Carbonate (CALCIUM 600 PO) Take 1 tablet by mouth 2 (two) times daily.     Cholecalciferol (VITAMIN D) 125 MCG (5000 UT) CAPS Take 5,000 mg by mouth.     docusate sodium (COLACE) 100 MG capsule Take 1 capsule (100 mg total) by mouth 2 (two) times daily as needed for mild constipation or moderate constipation. 60 capsule 3   gabapentin (NEURONTIN) 100 MG capsule Take 1 capsule (100 mg total) by mouth 3 (three) times daily. 90 capsule 0   HYDROcodone-acetaminophen (NORCO/VICODIN) 5-325 MG tablet Take 1 tablet by mouth every 6 (six) hours as needed for moderate pain (up to 3 doses for moderate pain.).     KLOR-CON M20 20 MEQ tablet TAKE 1 TABLET BY MOUTH TWICE A DAY 60 tablet 0   lidocaine-prilocaine (EMLA) cream APPLY TO AFFECTED AREA ONCE AS DIRECTED 30 g 3   loperamide (IMODIUM) 2 MG capsule Take 1 capsule (2 mg total) by mouth See admin instructions. Take 2 tablets after first loose stool,  then 1 tablet  after each loose stool; maximum: 8 tablets /day 60 capsule 0   loratadine (CLARITIN) 10 MG tablet Take 10 mg by mouth daily.     olmesartan (BENICAR) 20 MG tablet Take 1 tablet by mouth daily.     ondansetron (ZOFRAN) 8 MG tablet Take 1 tablet (8 mg total) by mouth 2 (two) times daily as needed for refractory nausea / vomiting. Start on day 3 after chemotherapy. 60 tablet 1   Probiotic Product (PROBIOTIC DAILY PO) Take 1 tablet by mouth daily.     prochlorperazine (COMPAZINE) 10 MG tablet Take 1 tablet (10 mg total) by mouth every 6 (six) hours as needed (NAUSEA). 30 tablet 1   No current facility-administered medications for this visit.   Facility-Administered Medications Ordered in Other Visits  Medication Dose Route Frequency Provider Last Rate Last Admin   0.9 %  sodium chloride infusion   Intravenous Once  Corcoran, Melissa C, MD       0.9 %  sodium chloride infusion   Intravenous Continuous Nolon Stalls C, MD 10 mL/hr at 12/31/19 1000 New Bag at 10/05/20 1045   heparin lock flush 100 unit/mL  500 Units Intravenous Once Corcoran,  Drue Second, MD        Performance status (ECOG): 1  Vitals There were no vitals taken for this visit.   Physical Exam Vitals and nursing note reviewed.  Constitutional:      General: He is not in acute distress.    Appearance: He is well-developed. He is obese. He is not diaphoretic.     Interventions: Face mask in place.  HENT:     Head: Normocephalic and atraumatic.  Eyes:     General: No scleral icterus.    Extraocular Movements: Extraocular movements intact.     Conjunctiva/sclera: Conjunctivae normal.     Pupils: Pupils are equal, round, and reactive to light.  Cardiovascular:     Rate and Rhythm: Normal rate and regular rhythm.     Heart sounds: Normal heart sounds. No murmur heard. Pulmonary:     Effort: Pulmonary effort is normal. No respiratory distress.     Breath sounds: No wheezing.     Comments: Decreased breath sound bilaterally Abdominal:     General: There is no distension.     Palpations: Abdomen is soft. There is no mass.     Tenderness: There is no abdominal tenderness.  Musculoskeletal:        General: No swelling or tenderness. Normal range of motion.     Cervical back: Normal range of motion and neck supple.     Comments: Trace edema bilaterally.   Lymphadenopathy:     Head:     Right side of head: No preauricular, posterior auricular or occipital adenopathy.     Left side of head: No preauricular, posterior auricular or occipital adenopathy.     Cervical: No cervical adenopathy.     Upper Body:     Right upper body: No supraclavicular or axillary adenopathy.     Left upper body: No supraclavicular or axillary adenopathy.     Lower Body: No right inguinal adenopathy. No left inguinal adenopathy.  Skin:    General: Skin  is warm and dry.  Neurological:     Mental Status: He is alert and oriented to person, place, and time. Mental status is at baseline.  Psychiatric:        Mood and Affect: Mood normal.    Labs were reviewed by me.     Latest Ref Rng & Units 01/31/2022    8:17 AM 01/17/2022    9:33 AM 01/03/2022    8:29 AM  CBC  WBC 4.0 - 10.5 K/uL 4.7  4.5  4.3   Hemoglobin 13.0 - 17.0 g/dL 12.0  11.6  12.0   Hematocrit 39.0 - 52.0 % 35.9  35.2  36.7   Platelets 150 - 400 K/uL 152  158  171       Latest Ref Rng & Units 01/17/2022    9:33 AM 01/03/2022    8:29 AM 12/20/2021    8:52 AM  CMP  Glucose 70 - 99 mg/dL 91  109  109   BUN 8 - 23 mg/dL $Remove'11  11  11   'kDMZnGv$ Creatinine 0.61 - 1.24 mg/dL 0.62  0.72  0.82   Sodium 135 - 145 mmol/L 137  136  137   Potassium 3.5 - 5.1 mmol/L 3.7  3.8  4.0   Chloride 98 - 111 mmol/L 103  106  105   CO2 22 - 32 mmol/L $RemoveB'26  24  24   'rtwHjtwR$ Calcium 8.9 - 10.3 mg/dL 9.1  9.1  9.7   Total Protein 6.5 -  8.1 g/dL 6.5  6.3  6.3   Total Bilirubin 0.3 - 1.2 mg/dL 1.1  1.3  0.9   Alkaline Phos 38 - 126 U/L 34  36  36   AST 15 - 41 U/L 31  36  39   ALT 0 - 44 U/L 20  20  24

## 2022-01-31 NOTE — Assessment & Plan Note (Signed)
Overall he tolerates chemotherapy.   CEA is relatively stable with small fluctuations. Labs reviewed and discussed with patient Proceed with 5-FU/bevacizumab treatment today Continue current regimen. 

## 2022-01-31 NOTE — Patient Instructions (Signed)
[facility] CANCER CTR AT [place]  Discharge Instructions: Thank you for choosing [facility] to provide your oncology and hematology care.  If you have a lab appointment with the [facility], please go directly to the [facility] and check in at the registration area.  Wear comfortable clothing and clothing appropriate for easy access to any Portacath or PICC line.   We strive to give you quality time with your provider. You may need to reschedule your appointment if you arrive late (15 or more minutes).  Arriving late affects you and other patients whose appointments are after yours.  Also, if you miss three or more appointments without notifying the office, you may be dismissed from the clinic at the provider's discretion.      For prescription refill requests, have your pharmacy contact our office and allow 72 hours for refills to be completed.    Today you received the following chemotherapy and/or immunotherapy agents: Avastin, Adrucil      To help prevent nausea and vomiting after your treatment, we encourage you to take your nausea medication as directed.  BELOW ARE SYMPTOMS THAT SHOULD BE REPORTED IMMEDIATELY: *FEVER GREATER THAN 100.4 F (38 C) OR HIGHER *CHILLS OR SWEATING *NAUSEA AND VOMITING THAT IS NOT CONTROLLED WITH YOUR NAUSEA MEDICATION *UNUSUAL SHORTNESS OF BREATH *UNUSUAL BRUISING OR BLEEDING *URINARY PROBLEMS (pain or burning when urinating, or frequent urination) *BOWEL PROBLEMS (unusual diarrhea, constipation, pain near the anus) TENDERNESS IN MOUTH AND THROAT WITH OR WITHOUT PRESENCE OF ULCERS (sore throat, sores in mouth, or a toothache) UNUSUAL RASH, SWELLING OR PAIN  UNUSUAL VAGINAL DISCHARGE OR ITCHING   Items with * indicate a potential emergency and should be followed up as soon as possible or go to the Emergency Department if any problems should occur.  Please show the CHEMOTHERAPY ALERT CARD or IMMUNOTHERAPY ALERT CARD at  check-in to the Emergency Department and triage nurse.  Should you have questions after your visit or need to cancel or reschedule your appointment, please contact [facility] CANCER [place] AT [place]  [phone] and follow the prompts.  Office hours are 8:00 a.m. to 4:30 p.m. Monday - Friday. Please note that voicemails left after 4:00 p.m. may not be returned until the following business day.  We are closed weekends and major holidays. You have access to a nurse at all times for urgent questions. Please call the main number to the clinic [phone] and follow the prompts.  For any non-urgent questions, you may also contact your provider using MyChart. We now offer e-Visits for anyone 65 and older to request care online for non-urgent symptoms. For details visit [email or web address].   Also download the MyChart app! Go to the app store, search "MyChart", open the app, select [facility], and log in with your MyChart username and password.  Masks are optional in the cancer centers. If you would like for your care team to wear a mask while they are taking care of you, please let them know. For doctor visits, patients may have with them one support person who is at least 81 years old. At this time, visitors are not allowed in the infusion area.

## 2022-01-31 NOTE — Assessment & Plan Note (Signed)
continue to monitor.  Hemoglobin is close to baseline.

## 2022-01-31 NOTE — Assessment & Plan Note (Signed)
Patient has gabapentin supply at home.Patient declines gabapentin and acupuncture.  

## 2022-01-31 NOTE — Assessment & Plan Note (Signed)
Chemotherapy plan as listed above 

## 2022-02-01 LAB — CEA: CEA: 12.8 ng/mL — ABNORMAL HIGH (ref 0.0–4.7)

## 2022-02-02 ENCOUNTER — Inpatient Hospital Stay: Payer: Medicare Other

## 2022-02-02 VITALS — BP 160/74 | HR 72 | Resp 18

## 2022-02-02 DIAGNOSIS — C786 Secondary malignant neoplasm of retroperitoneum and peritoneum: Secondary | ICD-10-CM

## 2022-02-02 DIAGNOSIS — Z85038 Personal history of other malignant neoplasm of large intestine: Secondary | ICD-10-CM

## 2022-02-02 DIAGNOSIS — Z5112 Encounter for antineoplastic immunotherapy: Secondary | ICD-10-CM | POA: Diagnosis not present

## 2022-02-02 MED ORDER — HEPARIN SOD (PORK) LOCK FLUSH 100 UNIT/ML IV SOLN
500.0000 [IU] | Freq: Once | INTRAVENOUS | Status: AC | PRN
  Administered 2022-02-02: 500 [IU]
  Filled 2022-02-02: qty 5

## 2022-02-02 MED ORDER — SODIUM CHLORIDE 0.9% FLUSH
10.0000 mL | INTRAVENOUS | Status: DC | PRN
  Administered 2022-02-02: 10 mL
  Filled 2022-02-02: qty 10

## 2022-02-13 MED FILL — Dexamethasone Sodium Phosphate Inj 100 MG/10ML: INTRAMUSCULAR | Qty: 1 | Status: AC

## 2022-02-14 ENCOUNTER — Inpatient Hospital Stay: Payer: Medicare Other

## 2022-02-14 ENCOUNTER — Encounter: Payer: Self-pay | Admitting: Oncology

## 2022-02-14 ENCOUNTER — Inpatient Hospital Stay (HOSPITAL_BASED_OUTPATIENT_CLINIC_OR_DEPARTMENT_OTHER): Payer: Medicare Other | Admitting: Oncology

## 2022-02-14 ENCOUNTER — Inpatient Hospital Stay: Payer: Medicare Other | Attending: Oncology

## 2022-02-14 VITALS — Temp 99.1°F

## 2022-02-14 VITALS — BP 122/74 | HR 71 | Resp 18 | Wt 244.0 lb

## 2022-02-14 DIAGNOSIS — C786 Secondary malignant neoplasm of retroperitoneum and peritoneum: Secondary | ICD-10-CM | POA: Diagnosis not present

## 2022-02-14 DIAGNOSIS — T451X5D Adverse effect of antineoplastic and immunosuppressive drugs, subsequent encounter: Secondary | ICD-10-CM | POA: Insufficient documentation

## 2022-02-14 DIAGNOSIS — Z86010 Personal history of colonic polyps: Secondary | ICD-10-CM | POA: Diagnosis not present

## 2022-02-14 DIAGNOSIS — Z803 Family history of malignant neoplasm of breast: Secondary | ICD-10-CM | POA: Insufficient documentation

## 2022-02-14 DIAGNOSIS — G620 Drug-induced polyneuropathy: Secondary | ICD-10-CM

## 2022-02-14 DIAGNOSIS — Z85038 Personal history of other malignant neoplasm of large intestine: Secondary | ICD-10-CM

## 2022-02-14 DIAGNOSIS — Z836 Family history of other diseases of the respiratory system: Secondary | ICD-10-CM | POA: Insufficient documentation

## 2022-02-14 DIAGNOSIS — T451X5A Adverse effect of antineoplastic and immunosuppressive drugs, initial encounter: Secondary | ICD-10-CM

## 2022-02-14 DIAGNOSIS — C184 Malignant neoplasm of transverse colon: Secondary | ICD-10-CM

## 2022-02-14 DIAGNOSIS — R634 Abnormal weight loss: Secondary | ICD-10-CM | POA: Diagnosis not present

## 2022-02-14 DIAGNOSIS — Z809 Family history of malignant neoplasm, unspecified: Secondary | ICD-10-CM | POA: Diagnosis not present

## 2022-02-14 DIAGNOSIS — D6481 Anemia due to antineoplastic chemotherapy: Secondary | ICD-10-CM | POA: Insufficient documentation

## 2022-02-14 DIAGNOSIS — Z5111 Encounter for antineoplastic chemotherapy: Secondary | ICD-10-CM | POA: Diagnosis present

## 2022-02-14 DIAGNOSIS — Z5112 Encounter for antineoplastic immunotherapy: Secondary | ICD-10-CM | POA: Insufficient documentation

## 2022-02-14 DIAGNOSIS — E669 Obesity, unspecified: Secondary | ICD-10-CM | POA: Insufficient documentation

## 2022-02-14 DIAGNOSIS — Z79899 Other long term (current) drug therapy: Secondary | ICD-10-CM | POA: Insufficient documentation

## 2022-02-14 LAB — COMPREHENSIVE METABOLIC PANEL
ALT: 18 U/L (ref 0–44)
AST: 33 U/L (ref 15–41)
Albumin: 3.3 g/dL — ABNORMAL LOW (ref 3.5–5.0)
Alkaline Phosphatase: 38 U/L (ref 38–126)
Anion gap: 6 (ref 5–15)
BUN: 11 mg/dL (ref 8–23)
CO2: 26 mmol/L (ref 22–32)
Calcium: 9.1 mg/dL (ref 8.9–10.3)
Chloride: 107 mmol/L (ref 98–111)
Creatinine, Ser: 0.65 mg/dL (ref 0.61–1.24)
GFR, Estimated: >60 mL/min (ref >60)
Glucose, Bld: 110 mg/dL — ABNORMAL HIGH (ref 70–99)
Potassium: 3.6 mmol/L (ref 3.5–5.1)
Sodium: 139 mmol/L (ref 135–145)
Total Bilirubin: 0.8 mg/dL (ref 0.3–1.2)
Total Protein: 6.4 g/dL — ABNORMAL LOW (ref 6.5–8.1)

## 2022-02-14 LAB — CBC WITH DIFFERENTIAL/PLATELET
Abs Immature Granulocytes: 0.01 10*3/uL (ref 0.00–0.07)
Basophils Absolute: 0 10*3/uL (ref 0.0–0.1)
Basophils Relative: 1 %
Eosinophils Absolute: 0.1 10*3/uL (ref 0.0–0.5)
Eosinophils Relative: 2 %
HCT: 35.7 % — ABNORMAL LOW (ref 39.0–52.0)
Hemoglobin: 11.8 g/dL — ABNORMAL LOW (ref 13.0–17.0)
Immature Granulocytes: 0 %
Lymphocytes Relative: 31 %
Lymphs Abs: 1.7 10*3/uL (ref 0.7–4.0)
MCH: 31.1 pg (ref 26.0–34.0)
MCHC: 33.1 g/dL (ref 30.0–36.0)
MCV: 94.2 fL (ref 80.0–100.0)
Monocytes Absolute: 0.5 10*3/uL (ref 0.1–1.0)
Monocytes Relative: 9 %
Neutro Abs: 3.1 10*3/uL (ref 1.7–7.7)
Neutrophils Relative %: 57 %
Platelets: 152 10*3/uL (ref 150–400)
RBC: 3.79 MIL/uL — ABNORMAL LOW (ref 4.22–5.81)
RDW: 15 % (ref 11.5–15.5)
WBC: 5.5 10*3/uL (ref 4.0–10.5)
nRBC: 0 % (ref 0.0–0.2)

## 2022-02-14 LAB — PROTEIN, URINE, RANDOM: Total Protein, Urine: 13 mg/dL

## 2022-02-14 MED ORDER — HEPARIN SOD (PORK) LOCK FLUSH 100 UNIT/ML IV SOLN
500.0000 [IU] | Freq: Once | INTRAVENOUS | Status: DC
  Filled 2022-02-14: qty 5

## 2022-02-14 MED ORDER — SODIUM CHLORIDE 0.9 % IV SOLN
Freq: Once | INTRAVENOUS | Status: AC
  Filled 2022-02-14: qty 250

## 2022-02-14 MED ORDER — SODIUM CHLORIDE 0.9 % IV SOLN
600.0000 mg | Freq: Once | INTRAVENOUS | Status: AC
  Administered 2022-02-14: 600 mg via INTRAVENOUS
  Filled 2022-02-14: qty 16

## 2022-02-14 MED ORDER — SODIUM CHLORIDE 0.9 % IV SOLN
900.0000 mg | Freq: Once | INTRAVENOUS | Status: AC
  Administered 2022-02-14: 900 mg via INTRAVENOUS
  Filled 2022-02-14: qty 45

## 2022-02-14 MED ORDER — SODIUM CHLORIDE 0.9 % IV SOLN
2400.0000 mg/m2 | INTRAVENOUS | Status: DC
  Administered 2022-02-14: 5500 mg via INTRAVENOUS
  Filled 2022-02-14: qty 110

## 2022-02-14 MED ORDER — SODIUM CHLORIDE 0.9% FLUSH
10.0000 mL | Freq: Once | INTRAVENOUS | Status: AC
  Administered 2022-02-14: 10 mL via INTRAVENOUS
  Filled 2022-02-14: qty 10

## 2022-02-14 MED ORDER — FLUOROURACIL CHEMO INJECTION 2.5 GM/50ML
400.0000 mg/m2 | Freq: Once | INTRAVENOUS | Status: AC
  Administered 2022-02-14: 900 mg via INTRAVENOUS
  Filled 2022-02-14: qty 18

## 2022-02-14 MED ORDER — SODIUM CHLORIDE 0.9 % IV SOLN
10.0000 mg | Freq: Once | INTRAVENOUS | Status: AC
  Administered 2022-02-14: 10 mg via INTRAVENOUS
  Filled 2022-02-14: qty 10

## 2022-02-14 NOTE — Progress Notes (Signed)
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   CEA is relatively stable with small fluctuations. Labs reviewed and discussed with patient Proceed with 5-FU/bevacizumab treatment today Continue current regimen.  Encounter for antineoplastic chemotherapy Chemotherapy plan as listed above.   Anemia due to chemotherapy continue to monitor.  Hemoglobin is close to baseline.  Chemotherapy-induced neuropathy ([place]) Patient has gabapentin supply at home.Patient declines gabapentin and acupuncture.    Follow-up 2 weeks lab MD 5-FU/bevacizumab -   All questions were answered. The patient knows to call the clinic with any problems, questions or concerns.  Jared Server, MD, PhD [facility] Hematology Oncology 02/14/2022   Chief Complaint: Jared Gutierrez is a 81 y.o. male presents for follow-up of metastatic colon cancer   PERTINENT ONCOLOGY HISTORY Jared Gutierrez is a 81 y.o.[place] who has above oncology history reviewed by me today presented for follow up visit for management of Metastatic colon cancer. [name], patient switched care to me on 11/11/20 Extensive medical record review was performed by me  Oncology History  History of colon cancer, stage I  10/02/2014 Pathology Results   [id number] colonoscopy pathology A. CECAL POLYPS; HOT SNARE AND COLD BIOPSY:  - TUBULAR ADENOMA, MULTIPLE FRAGMENTS.  - NEGATIVE FOR HIGH-GRADE DYSPLASIA AND MALIGNANCY.   B. COLON POLYP, TRANSVERSE; COLD BIOPSY:  - TUBULAR ADENOMA.  - NEGATIVE FOR HIGH-GRADE DYSPLASIA AND MALIGNANCY.     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  Tumor extended into the submucosa. Margins were negative. + lymphovascular invasion. 14 lymph nodes were negative. Pathologic stage was T1 N0.  TRANSVERSE COLON, RESECTION:  - INVASIVE ADENOCARCINOMA ARISING IN A 4.6 CM TUBULOVILLOUS  ADENOMA WITH HIGH GRADE DYSPLASIA (MALIGNANT POLYP).  - SEE SUMMARY BELOW.  Jared Gutierrez  ONCOLOGY SUMMARY: COLON AND RECTUM, RESECTION, AJCC 7TH EDITION  Specimen: transverse colon  Procedure: transverse colon resection  Tumor site: splenic flexure  Tumor size: 1.0 cm (invasive component)  Macroscopic tumor perforation: not specified  Histologic type: invasive adenocarcinoma  Histologic grade: moderately differentiated  Microscopic tumor extension: into submucosa  Margins:       Proximal margin: negative       Distal margin: negative       Circumferential (radial) or mesenteric margin: negative       If all margins uninvolved by invasive carcinoma:  Distance of invasive carcinoma from closest margin: 7.5 cm  to distal margin  Treatment effect: not applicable  Lymph-vascular invasion: present  Perineural invasion: not identified  Tumor deposits (discontinuous extramural extension): not   identified  Pathologic staging:       Primary tumor: pT1       Regional lymph nodes: pN0            Number of nodes examined: 14            Number of nodes involved: 0    04/02/2019 Progression   04/02/2019  PET scan  limited evealed a 2.4 x 2.3 cm (SUV 11) hypermetabolic soft tissue density caudal and anterior to the pancreatic neck favored to represent isolated peritoneal or nodal metastasis in the setting of prior transverse colonic resection (expected primary drainage). Although this was immediately adjacent to the pancreas, a fat plane was maintained, arguing strongly against a pancreatic primary. Otherwise, there was no evidence of hypermetabolic metastasis. 04/17/2019 EUS on  revealed a normal esophagus, stomach, duodenum, and pancreas.  There was a 2.4 x 2.4 cm irregular mass in the retroperitoneum adjacent to, but not involving the pancreatic neck.  FNA and core needle biopsy were performed.  Pathology revealed adenocarcinoma compatible with a metastatic lesion of colorectal origin.  Tumor cells were positive for CK20 and CDX2 and negative for CK7.  NGS: [employer] on 05/15/2019 revealed + KRASG13D and TP53.  Negative results included BRAF V600E, Her2, NRAS, NTRK, PD-L1 (<1%), and TMB 8.7/Mb (intermediate).  MMR testing from his colon resection on 10/16/2013 was intact with a low probability of MSI-H.   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  He received Neulasta after cycle #4 and #5 secondary to progressive leukopenia.  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  He received a truncated course of FOLFOX with cycle #11 secondary to oxaliplatin reaction.   01/14/2020 Imaging    PET revealed an interval decrease in size and FDG uptake (2.4 x 2.3 cm with SUV 11 to 2.4 x 1.7 cm with SUV 4.09) associated with the previously referenced  soft tissue density caudal and anterior to the pancreatic neck suggesting treatment response. There were no new sites of FDG avid tumor.   08/31/2020 Imaging   08/31/2020 Abdomen and pelvis CT revealed increased size of the index soft tissue lesion inferior and anterior to the pancreatic neck (2.9 x 1.8 cm to 3.5 x 2.9 cm). There were similar prominent retroperitoneal lymph nodes without adenopathy by size criteria. There was no new or enlarging abdominal or pelvic lymph nodes. There were no new interval findings. There was similar circumferential wall thickening of a nondistended urinary bladder, which likely accentuated wall thickening There was hepatic steatosis and aortic atherosclerosis.   11/03/2020, PET showed recurrent peritoneal metastasis in the upper abdomen adjacent to the pancreas.  The lesion is 3.8 x 2.9 cm with SUV of 11.3. No evidence of metastatic peritoneal disease elsewhere in the abdomen pelvis.  No evidence of distant metastasis.   11/03/2020 Imaging   PET scan showed recurrent peritoneal metastasis near pancreas. Given that he has no other distant metastasis. Discussed with radiation oncology. Repeat SBRT may be considered if he does not respond well to systemic chemotherapy.     11/26/2020 -  Chemotherapy   5-FU and bevacizumab.   02/17/2021 Imaging  02/17/2021 CT abdomen pelvis showed partial response. Mild decrease of peritoneal lesion.  Proceed with 5-FU and bevacizumab today.  Given that he is tolerating current regimen with good life quality, partial response.  Shared decision was made to hold off adding additional chemotherapy agents.   06/08/2021, CT chest abdomen pelvis with contrast showed soft tissue mass at the base of mesentery inferior to the pancreatic neck is unchanged in size.  No progressive disease was identified.   09/07/2021, CT chest abdomen pelvis with contrast showed no substantial changes in size of the mesenteric mass, 2.6 cm.  No suspicious new  lesions.  Chronic findings as detailed in the imaging report.   12/05/2021 Imaging   PET scan showed soft tissue mesenteric mass is unchanged in size.  Decreased FDG uptake compared to activity on November 03, 2020.  Fever treated disease.  No new metastatic disease.      Other medical problems Chronic lower extremity edema. 12/24/2018, right lower extremity duplex negative for DVT.  Small right Baker's cyst. 08/16/2019, bilateral lower extremity duplex showed no DVT. 08/16/2019 - 08/17/2019 with right lower extremity cellulitis.  He was unable to bear weight.  He was treated with IV fluids, NSAIDs, colchicine, and broad antibiotics (vancomycin and Cefepime).  He was discharged on indomethacin x 5 days and Keflex 500 mg TID x 5 days.  10/05/2020, colonoscopy showed internal hemorrhoids.  Otherwise normal examination.  INTERVAL HISTORY WHEELER INCORVAIA is a 81 y.o. male who has above history reviewed by me today presents for follow up visit for management of recurrent metastatic colon cancer Patient was accompanied by wife.   Denies fever, chills, nausea, vomiting, diarrhea, chest pain, shortness of breath, abdominal pain, urinary symptoms.  Patient reports no new complaints.    Review of Systems  Constitutional:  Negative for appetite change, chills, diaphoresis, fever and unexpected weight change.  HENT:   Negative for hearing loss, nosebleeds, sore throat and tinnitus.   Respiratory:  Negative for cough, hemoptysis and shortness of breath.   Cardiovascular:  Negative for chest pain and palpitations.  Gastrointestinal:  Negative for abdominal pain, blood in stool, constipation, diarrhea, nausea and vomiting.  Genitourinary:  Negative for dysuria, frequency and hematuria.   Musculoskeletal:  Positive for arthralgias. Negative for back pain, myalgias and neck pain.  Skin:  Negative for itching and rash.  Neurological:  Positive for numbness. Negative for dizziness and headaches.  Hematological:   Does not bruise/bleed easily.  Psychiatric/Behavioral:  Negative for depression. The patient is not nervous/anxious.       Past Medical History:  Diagnosis Date   Arthritis    OSTEOARTHRITIS   Cancer ([place])    Cavitary lesion of lung    RIGHT LOWER LOBE   Chicken pox    Colon cancer ([facility])    History of kidney stones    Hypertension    Lipoma of colon    Nephrolithiasis    Nephrolithiasis    Obesity    Shingles    Tubular adenoma of colon    multiple fragments    Past Surgical History:  Procedure Laterality Date   COLON SURGERY     COLONOSCOPY N/A 10/02/2014   Procedure: COLONOSCOPY;  Surgeon: Josefine Class, MD;  Location: [facility] ENDOSCOPY;  Service: Endoscopy;  Laterality: N/A;   COLONOSCOPY WITH PROPOFOL N/A 02/16/2017   Procedure: COLONOSCOPY WITH PROPOFOL;  Surgeon: Manya Silvas, MD;  Location: [facility] ENDOSCOPY;  Service: Endoscopy;  Laterality: N/A;   COLONOSCOPY WITH PROPOFOL N/A 10/05/2020  Procedure: COLONOSCOPY WITH PROPOFOL;  Surgeon: Lesly Rubenstein, MD;  Location: [facility] ENDOSCOPY;  Service: Endoscopy;  Laterality: N/A;   EUS N/A 04/17/2019   Procedure: FULL UPPER ENDOSCOPIC ULTRASOUND (EUS) RADIAL;  Surgeon: Holly Bodily, MD;  Location: [facility] ENDOSCOPY;  Service: Gastroenterology;  Laterality: N/A;   KIDNEY STONE SURGERY     PARTIAL COLECTOMY  10/17/2013   PORTACATH PLACEMENT Right 06/13/2019   Procedure: INSERTION PORT-A-CATH;  Surgeon: Benjamine Sprague, DO;  Location: [facility] ORS;  Service: General;  Laterality: Right;    Family History  Problem Relation Age of Onset   Cancer Mother    Breast cancer Mother    COPD Father     Social History:  reports that he has never smoked. He has never used smokeless tobacco. He reports current alcohol use. He reports that he does not use drugs.    Allergies: No Known Allergies  Current Medications: Current Outpatient Medications  Medication Sig Dispense Refill   acetaminophen (TYLENOL) 500 MG tablet Take  500 mg by mouth every 6 (six) hours as needed.     amLODipine (NORVASC) 2.5 MG tablet Take 2.5 mg by mouth daily.     aspirin EC 81 MG tablet Take 81 mg by mouth daily.     Calcium Carbonate (CALCIUM 600 PO) Take 1 tablet by mouth 2 (two) times daily.     Cholecalciferol (VITAMIN D) 125 MCG (5000 UT) CAPS Take 5,000 mg by mouth.     docusate sodium (COLACE) 100 MG capsule Take 1 capsule (100 mg total) by mouth 2 (two) times daily as needed for mild constipation or moderate constipation. 60 capsule 3   HYDROcodone-acetaminophen (NORCO/VICODIN) 5-325 MG tablet Take 1 tablet by mouth every 6 (six) hours as needed for moderate pain (up to 3 doses for moderate pain.).     KLOR-CON M20 20 MEQ tablet TAKE 1 TABLET BY MOUTH TWICE A DAY 60 tablet 0   lidocaine-prilocaine (EMLA) cream APPLY TO AFFECTED AREA ONCE AS DIRECTED 30 g 3   loperamide (IMODIUM) 2 MG capsule Take 1 capsule (2 mg total) by mouth See admin instructions. Take 2 tablets after first loose stool,  then 1 tablet  after each loose stool; maximum: 8 tablets /day 60 capsule 0   olmesartan (BENICAR) 20 MG tablet Take 1 tablet by mouth daily.     ondansetron (ZOFRAN) 8 MG tablet Take 1 tablet (8 mg total) by mouth 2 (two) times daily as needed for refractory nausea / vomiting. Start on day 3 after chemotherapy. 60 tablet 1   Probiotic Product (PROBIOTIC DAILY PO) Take 1 tablet by mouth daily.     prochlorperazine (COMPAZINE) 10 MG tablet Take 1 tablet (10 mg total) by mouth every 6 (six) hours as needed (NAUSEA). 30 tablet 1   loratadine (CLARITIN) 10 MG tablet Take 10 mg by mouth daily. (Patient not taking: Reported on 02/14/2022)     No current facility-administered medications for this visit.   Facility-Administered Medications Ordered in Other Visits  Medication Dose Route Frequency Provider Last Rate Last Admin   0.9 %  sodium chloride infusion   Intravenous Once Corcoran, Melissa C, MD       0.9 %  sodium chloride infusion   Intravenous  Continuous Mike Gip, Melissa C, MD 10 mL/hr at 12/31/19 1000 New Bag at 10/05/20 1045   fluorouracil (ADRUCIL) 5,500 mg in sodium chloride 0.9 % 140 mL chemo infusion  2,400 mg/m2 (Treatment Plan Recorded) Intravenous 1 day or 1 dose Jared Server,  MD   Infusion Verify at 02/14/22 1157   heparin lock flush 100 unit/mL  500 Units Intravenous Once Corcoran, Melissa C, MD       heparin lock flush 100 unit/mL  500 Units Intravenous Once Jared Server, MD        Performance status (ECOG): 1  Vitals Blood pressure 122/74, pulse 71, resp. rate 18, weight 244 lb (110.7 kg), SpO2 100 %.   Physical Exam Vitals and nursing note reviewed.  Constitutional:      General: He is not in acute distress.    Appearance: He is well-developed. He is obese. He is not diaphoretic.     Interventions: Face mask in place.  HENT:     Head: Normocephalic and atraumatic.  Eyes:     General: No scleral icterus.    Extraocular Movements: Extraocular movements intact.     Conjunctiva/sclera: Conjunctivae normal.     Pupils: Pupils are equal, round, and reactive to light.  Cardiovascular:     Rate and Rhythm: Normal rate and regular rhythm.     Heart sounds: Normal heart sounds. No murmur heard. Pulmonary:     Effort: Pulmonary effort is normal. No respiratory distress.     Breath sounds: No wheezing.     Comments: Decreased breath sound bilaterally Abdominal:     General: There is no distension.     Palpations: Abdomen is soft. There is no mass.     Tenderness: There is no abdominal tenderness.  Musculoskeletal:        General: No swelling or tenderness. Normal range of motion.     Cervical back: Normal range of motion and neck supple.     Comments: Trace edema bilaterally.   Lymphadenopathy:     Head:     Right side of head: No preauricular, posterior auricular or occipital adenopathy.     Left side of head: No preauricular, posterior auricular or occipital adenopathy.     Cervical: No cervical adenopathy.      Upper Body:     Right upper body: No supraclavicular or axillary adenopathy.     Left upper body: No supraclavicular or axillary adenopathy.     Lower Body: No right inguinal adenopathy. No left inguinal adenopathy.  Skin:    General: Skin is warm and dry.  Neurological:     Mental Status: He is alert and oriented to person, place, and time. Mental status is at baseline.  Psychiatric:        Mood and Affect: Mood normal.    Labs were reviewed by me.     Latest Ref Rng & Units 02/14/2022    8:35 AM 01/31/2022    8:17 AM 01/17/2022    9:33 AM  CBC  WBC 4.0 - 10.5 K/uL 5.5  4.7  4.5   Hemoglobin 13.0 - 17.0 g/dL 11.8  12.0  11.6   Hematocrit 39.0 - 52.0 % 35.7  35.9  35.2   Platelets 150 - 400 K/uL 152  152  158       Latest Ref Rng & Units 02/14/2022    8:35 AM 01/31/2022    8:17 AM 01/17/2022    9:33 AM  CMP  Glucose 70 - 99 mg/dL 110  114  91   BUN 8 - 23 mg/dL _0 Creatinine 0.61 - 1.24 mg/dL 0.65  0.72  0.62   Sodium 135 - 145 mmol/L 139  134  137   Potassium 3.5 - 5.1  mmol/L 3.6  3.7  3.7   Chloride 98 - 111 mmol/L 107  106  103   CO2 22 - 32 mmol/L _0 Calcium 8.9 - 10.3 mg/dL 9.1  9.1  9.1   Total Protein 6.5 - 8.1 g/dL 6.4  6.6  6.5   Total Bilirubin 0.3 - 1.2 mg/dL 0.8  1.1  1.1   Alkaline Phos 38 - 126 U/L 38  37  34   AST 15 - 41 U/L 33  36  31   ALT 0 - 44 U/L 18  21  20

## 2022-02-14 NOTE — Progress Notes (Signed)
Nutrition Follow-up:  Patient with metastatic colon cancer.  S/p transverse colectomy (2015).  Patient receiving 5-FU and bevacizumab.  Met with patient during infusion.  Patient reports that appetite is good.  Eating mostly 3 meals a day.  Drinking ensure daily.  Says that constipation is better.     Medications: reviewed  Labs: reviewed  Anthropometrics:   Weight 244 lb today  240 lb 12.8 oz on 8/22 262 lb on 05/27/21   NUTRITION DIAGNOSIS: Unintentional weight loss improving   INTERVENTION:  Continue to encouraged high protein foods Continue oral nutrition supplement     MONITORING, EVALUATION, GOAL: weight trends, intake   NEXT VISIT: ~ 4 weeks in infusion  Jeanee Fabre B. Zenia Resides, [place], [place] Registered Dietitian [phone]

## 2022-02-14 NOTE — Assessment & Plan Note (Signed)
Chemotherapy plan as listed above 

## 2022-02-14 NOTE — Assessment & Plan Note (Signed)
Patient has gabapentin supply at home.Patient declines gabapentin and acupuncture.  

## 2022-02-14 NOTE — Assessment & Plan Note (Signed)
continue to monitor.  Hemoglobin is close to baseline.

## 2022-02-14 NOTE — Assessment & Plan Note (Signed)
Overall he tolerates chemotherapy.   CEA is relatively stable with small fluctuations. Labs reviewed and discussed with patient Proceed with 5-FU/bevacizumab treatment today Continue current regimen. 

## 2022-02-14 NOTE — Patient Instructions (Signed)
[facility]  Discharge Instructions: Thank you for choosing [facility] to provide your oncology and hematology care.  If you have a lab appointment with the Cancer Center, please go directly to the Cancer Center and check in at the registration area.  Wear comfortable clothing and clothing appropriate for easy access to any Portacath or PICC line.   We strive to give you quality time with your provider. You may need to reschedule your appointment if you arrive late (15 or more minutes).  Arriving late affects you and other patients whose appointments are after yours.  Also, if you miss three or more appointments without notifying the office, you may be dismissed from the clinic at the provider's discretion.      For prescription refill requests, have your pharmacy contact our office and allow 72 hours for refills to be completed.       To help prevent nausea and vomiting after your treatment, we encourage you to take your nausea medication as directed.  BELOW ARE SYMPTOMS THAT SHOULD BE REPORTED IMMEDIATELY: *FEVER GREATER THAN 100.4 F (38 C) OR HIGHER *CHILLS OR SWEATING *NAUSEA AND VOMITING THAT IS NOT CONTROLLED WITH YOUR NAUSEA MEDICATION *UNUSUAL SHORTNESS OF BREATH *UNUSUAL BRUISING OR BLEEDING *URINARY PROBLEMS (pain or burning when urinating, or frequent urination) *BOWEL PROBLEMS (unusual diarrhea, constipation, pain near the anus) TENDERNESS IN MOUTH AND THROAT WITH OR WITHOUT PRESENCE OF ULCERS (sore throat, sores in mouth, or a toothache) UNUSUAL RASH, SWELLING OR PAIN  UNUSUAL VAGINAL DISCHARGE OR ITCHING   Items with * indicate a potential emergency and should be followed up as soon as possible or go to the Emergency Department if any problems should occur.  Please show the CHEMOTHERAPY ALERT CARD or IMMUNOTHERAPY ALERT CARD at check-in to the Emergency Department and triage nurse.  Should you have questions after your  visit or need to cancel or reschedule your appointment, please contact [facility]  [phone] and follow the prompts.  Office hours are 8:00 a.m. to 4:30 p.m. Monday - Friday. Please note that voicemails left after 4:00 p.m. may not be returned until the following business day.  We are closed weekends and major holidays. You have access to a nurse at all times for urgent questions. Please call the main number to the clinic [phone] and follow the prompts.  For any non-urgent questions, you may also contact your provider using MyChart. We now offer e-Visits for anyone 18 and older to request care online for non-urgent symptoms. For details visit [email or web address].   Also download the MyChart app! Go to the app store, search "MyChart", open the app, select [facility], and log in with your MyChart username and password.  Masks are optional in the cancer centers. If you would like for your care team to wear a mask while they are taking care of you, please let them know. For doctor visits, patients may have with them one support person who is at least 81 years old. At this time, visitors are not allowed in the infusion area.   

## 2022-02-15 ENCOUNTER — Other Ambulatory Visit: Payer: Self-pay

## 2022-02-16 ENCOUNTER — Inpatient Hospital Stay: Payer: Medicare Other

## 2022-02-16 VITALS — BP 146/76

## 2022-02-16 DIAGNOSIS — Z85038 Personal history of other malignant neoplasm of large intestine: Secondary | ICD-10-CM

## 2022-02-16 DIAGNOSIS — C786 Secondary malignant neoplasm of retroperitoneum and peritoneum: Secondary | ICD-10-CM

## 2022-02-16 DIAGNOSIS — Z5112 Encounter for antineoplastic immunotherapy: Secondary | ICD-10-CM | POA: Diagnosis not present

## 2022-02-16 LAB — CEA: CEA: 11.7 ng/mL — ABNORMAL HIGH (ref 0.0–4.7)

## 2022-02-16 MED ORDER — SODIUM CHLORIDE 0.9% FLUSH
10.0000 mL | INTRAVENOUS | Status: DC | PRN
  Administered 2022-02-16: 10 mL
  Filled 2022-02-16: qty 10

## 2022-02-16 MED ORDER — HEPARIN SOD (PORK) LOCK FLUSH 100 UNIT/ML IV SOLN
500.0000 [IU] | Freq: Once | INTRAVENOUS | Status: AC | PRN
  Administered 2022-02-16: 500 [IU]
  Filled 2022-02-16: qty 5

## 2022-02-18 ENCOUNTER — Other Ambulatory Visit: Payer: Self-pay | Admitting: Oncology

## 2022-02-20 ENCOUNTER — Encounter: Payer: Self-pay | Admitting: Oncology

## 2022-02-20 NOTE — Telephone Encounter (Signed)
Component Ref Range & Units 6 d ago (02/14/22) 2 wk ago (01/31/22) 1 mo ago (01/17/22) 1 mo ago (01/03/22) 2 mo ago (12/20/21) 2 mo ago (12/06/21) 3 mo ago (11/22/21)  Potassium 3.5 - 5.1 mmol/L 3.6  3.7  3.7  3.8  4.0  3.2 Low   3.9

## 2022-02-27 MED FILL — Dexamethasone Sodium Phosphate Inj 100 MG/10ML: INTRAMUSCULAR | Qty: 1 | Status: AC

## 2022-02-28 ENCOUNTER — Encounter: Payer: Self-pay | Admitting: Oncology

## 2022-02-28 ENCOUNTER — Inpatient Hospital Stay: Payer: Medicare Other

## 2022-02-28 ENCOUNTER — Inpatient Hospital Stay (HOSPITAL_BASED_OUTPATIENT_CLINIC_OR_DEPARTMENT_OTHER): Payer: Medicare Other | Admitting: Oncology

## 2022-02-28 VITALS — BP 126/61 | HR 69 | Resp 18 | Ht 68.0 in | Wt 242.0 lb

## 2022-02-28 DIAGNOSIS — D6481 Anemia due to antineoplastic chemotherapy: Secondary | ICD-10-CM

## 2022-02-28 DIAGNOSIS — C184 Malignant neoplasm of transverse colon: Secondary | ICD-10-CM

## 2022-02-28 DIAGNOSIS — C786 Secondary malignant neoplasm of retroperitoneum and peritoneum: Secondary | ICD-10-CM

## 2022-02-28 DIAGNOSIS — T451X5A Adverse effect of antineoplastic and immunosuppressive drugs, initial encounter: Secondary | ICD-10-CM

## 2022-02-28 DIAGNOSIS — Z85038 Personal history of other malignant neoplasm of large intestine: Secondary | ICD-10-CM

## 2022-02-28 DIAGNOSIS — Z5111 Encounter for antineoplastic chemotherapy: Secondary | ICD-10-CM | POA: Diagnosis not present

## 2022-02-28 DIAGNOSIS — Z5112 Encounter for antineoplastic immunotherapy: Secondary | ICD-10-CM | POA: Diagnosis not present

## 2022-02-28 LAB — COMPREHENSIVE METABOLIC PANEL WITH GFR
ALT: 19 U/L (ref 0–44)
AST: 34 U/L (ref 15–41)
Albumin: 3.4 g/dL — ABNORMAL LOW (ref 3.5–5.0)
Alkaline Phosphatase: 39 U/L (ref 38–126)
Anion gap: 6 (ref 5–15)
BUN: 10 mg/dL (ref 8–23)
CO2: 26 mmol/L (ref 22–32)
Calcium: 9.3 mg/dL (ref 8.9–10.3)
Chloride: 105 mmol/L (ref 98–111)
Creatinine, Ser: 0.88 mg/dL (ref 0.61–1.24)
GFR, Estimated: >60 mL/min
Glucose, Bld: 128 mg/dL — ABNORMAL HIGH (ref 70–99)
Potassium: 3.7 mmol/L (ref 3.5–5.1)
Sodium: 137 mmol/L (ref 135–145)
Total Bilirubin: 1.4 mg/dL — ABNORMAL HIGH (ref 0.3–1.2)
Total Protein: 6.5 g/dL (ref 6.5–8.1)

## 2022-02-28 LAB — CBC WITH DIFFERENTIAL/PLATELET
Abs Immature Granulocytes: 0.01 K/uL (ref 0.00–0.07)
Basophils Absolute: 0 K/uL (ref 0.0–0.1)
Basophils Relative: 1 %
Eosinophils Absolute: 0.1 K/uL (ref 0.0–0.5)
Eosinophils Relative: 2 %
HCT: 35.8 % — ABNORMAL LOW (ref 39.0–52.0)
Hemoglobin: 11.9 g/dL — ABNORMAL LOW (ref 13.0–17.0)
Immature Granulocytes: 0 %
Lymphocytes Relative: 33 %
Lymphs Abs: 1.6 K/uL (ref 0.7–4.0)
MCH: 30.8 pg (ref 26.0–34.0)
MCHC: 33.2 g/dL (ref 30.0–36.0)
MCV: 92.7 fL (ref 80.0–100.0)
Monocytes Absolute: 0.4 K/uL (ref 0.1–1.0)
Monocytes Relative: 8 %
Neutro Abs: 2.6 K/uL (ref 1.7–7.7)
Neutrophils Relative %: 56 %
Platelets: 157 K/uL (ref 150–400)
RBC: 3.86 MIL/uL — ABNORMAL LOW (ref 4.22–5.81)
RDW: 15.2 % (ref 11.5–15.5)
WBC: 4.7 K/uL (ref 4.0–10.5)
nRBC: 0 % (ref 0.0–0.2)

## 2022-02-28 LAB — PROTEIN, URINE, RANDOM: Total Protein, Urine: 16 mg/dL

## 2022-02-28 MED ORDER — SODIUM CHLORIDE 0.9 % IV SOLN
5.0000 mg/kg | Freq: Once | INTRAVENOUS | Status: AC
  Administered 2022-02-28: 500 mg via INTRAVENOUS
  Filled 2022-02-28: qty 16

## 2022-02-28 MED ORDER — SODIUM CHLORIDE 0.9 % IV SOLN
900.0000 mg | Freq: Once | INTRAVENOUS | Status: AC
  Administered 2022-02-28: 900 mg via INTRAVENOUS
  Filled 2022-02-28: qty 45

## 2022-02-28 MED ORDER — FLUOROURACIL CHEMO INJECTION 2.5 GM/50ML
400.0000 mg/m2 | Freq: Once | INTRAVENOUS | Status: AC
  Administered 2022-02-28: 900 mg via INTRAVENOUS
  Filled 2022-02-28: qty 18

## 2022-02-28 MED ORDER — SODIUM CHLORIDE 0.9 % IV SOLN
Freq: Once | INTRAVENOUS | Status: AC
  Filled 2022-02-28: qty 250

## 2022-02-28 MED ORDER — SODIUM CHLORIDE 0.9 % IV SOLN
10.0000 mg | Freq: Once | INTRAVENOUS | Status: AC
  Administered 2022-02-28: 10 mg via INTRAVENOUS
  Filled 2022-02-28: qty 10

## 2022-02-28 MED ORDER — SODIUM CHLORIDE 0.9 % IV SOLN
2400.0000 mg/m2 | INTRAVENOUS | Status: DC
  Administered 2022-02-28: 5500 mg via INTRAVENOUS
  Filled 2022-02-28: qty 110

## 2022-02-28 NOTE — Assessment & Plan Note (Signed)
Mild anemia. continue to monitor.  Hemoglobin is close to baseline. 

## 2022-02-28 NOTE — Assessment & Plan Note (Signed)
Chemotherapy plan as listed above 

## 2022-02-28 NOTE — Assessment & Plan Note (Signed)
Overall he tolerates chemotherapy.   CEA is relatively stable with small fluctuations. Labs reviewed and discussed with patient Proceed with 5-FU/bevacizumab treatment today Continue current regimen. 

## 2022-02-28 NOTE — Progress Notes (Signed)
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   CEA is relatively stable with small fluctuations. Labs reviewed and discussed with patient Proceed with 5-FU/bevacizumab treatment today Continue current regimen.  Encounter for antineoplastic chemotherapy Chemotherapy plan as listed above.   Anemia due to chemotherapy Mild anemia. continue to monitor.  Hemoglobin is close to baseline.   Follow-up 2 weeks lab MD 5-FU/bevacizumab -   All questions were answered. The patient knows to call the clinic with any problems, questions or concerns.  Jared Server, MD, PhD [facility] Hematology Oncology 02/28/2022   Chief Complaint: Jared Gutierrez is a 81 y.o. male presents for follow-up of metastatic colon cancer   PERTINENT ONCOLOGY HISTORY Jared Gutierrez is a 81 y.o.[place] who has above oncology history reviewed by me today presented for follow up visit for management of Metastatic colon cancer. [name], patient switched care to me on 11/11/20 Extensive medical record review was performed by me  Oncology History  History of colon cancer, stage I  10/02/2014 Pathology Results   [id number] colonoscopy pathology A. CECAL POLYPS; HOT SNARE AND COLD BIOPSY:  - TUBULAR ADENOMA, MULTIPLE FRAGMENTS.  - NEGATIVE FOR HIGH-GRADE DYSPLASIA AND MALIGNANCY.   B. COLON POLYP, TRANSVERSE; COLD BIOPSY:  - TUBULAR ADENOMA.  - NEGATIVE FOR HIGH-GRADE DYSPLASIA AND MALIGNANCY.     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  Tumor extended into the submucosa. Margins were negative. + lymphovascular invasion. 14 lymph nodes were negative. Pathologic stage was T1 N0.  TRANSVERSE COLON, RESECTION:  - INVASIVE ADENOCARCINOMA ARISING IN A 4.6 CM TUBULOVILLOUS  ADENOMA WITH HIGH GRADE DYSPLASIA (MALIGNANT POLYP).  - SEE SUMMARY BELOW.  Jared Gutierrez  ONCOLOGY SUMMARY: COLON AND RECTUM, RESECTION, AJCC 7TH EDITION  Specimen: transverse colon  Procedure: transverse colon resection  Tumor site: splenic flexure  Tumor size: 1.0 cm (invasive component)  Macroscopic tumor perforation: not specified  Histologic type: invasive adenocarcinoma  Histologic grade: moderately differentiated  Microscopic tumor extension: into submucosa  Margins:       Proximal margin: negative       Distal margin: negative       Circumferential (radial) or mesenteric margin: negative       If all margins uninvolved by invasive carcinoma:       Distance of invasive carcinoma from closest margin: 7.5 cm  to distal margin  Treatment effect: not applicable  Lymph-vascular invasion: present  Perineural invasion: not identified  Tumor deposits (discontinuous extramural extension): not  identified  Pathologic staging:       Primary tumor: pT1       Regional lymph nodes: pN0             Number of nodes examined: 14            Number of nodes involved: 0    04/02/2019 Progression   04/02/2019  PET scan  limited evealed a 2.4 x 2.3 cm (SUV 11) hypermetabolic soft tissue density caudal and anterior to the pancreatic neck favored to represent isolated peritoneal or nodal metastasis in the setting of prior transverse colonic resection (expected primary drainage). Although this was immediately adjacent to the pancreas, a fat plane was maintained, arguing strongly against a pancreatic primary. Otherwise, there was no evidence of hypermetabolic metastasis. 04/17/2019 EUS on  revealed a normal esophagus, stomach, duodenum, and pancreas.  There was a 2.4 x 2.4 cm irregular mass in the retroperitoneum adjacent to, but not involving the pancreatic neck.  FNA and core needle biopsy were performed.  Pathology revealed adenocarcinoma compatible with a metastatic lesion of colorectal origin.  Tumor cells were positive for CK20 and CDX2 and negative for CK7.  NGS: [employer] on 05/15/2019 revealed + KRASG13D and TP53.  Negative results included BRAF V600E, Her2, NRAS, NTRK, PD-L1 (<1%), and TMB 8.7/Mb (intermediate).  MMR testing from his colon resection on 10/16/2013 was intact with a low probability of MSI-H.   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  He received Neulasta after cycle #4 and #5 secondary to progressive leukopenia.  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  He received a truncated course of FOLFOX with cycle #11 secondary to oxaliplatin reaction.   01/14/2020 Imaging    PET revealed an interval decrease in size and FDG uptake (2.4 x 2.3 cm with SUV 11 to 2.4 x 1.7 cm with SUV 4.09) associated with the previously referenced soft tissue density caudal and anterior to the pancreatic neck suggesting treatment response. There were  no new sites of FDG avid tumor.   08/31/2020 Imaging   08/31/2020 Abdomen and pelvis CT revealed increased size of the index soft tissue lesion inferior and anterior to the pancreatic neck (2.9 x 1.8 cm to 3.5 x 2.9 cm). There were similar prominent retroperitoneal lymph nodes without adenopathy by size criteria. There was no new or enlarging abdominal or pelvic lymph nodes. There were no new interval findings. There was similar circumferential wall thickening of a nondistended urinary bladder, which likely accentuated wall thickening There was hepatic steatosis and aortic atherosclerosis.   11/03/2020, PET showed recurrent peritoneal metastasis in the upper abdomen adjacent to the pancreas.  The lesion is 3.8 x 2.9 cm with SUV of 11.3. No evidence of metastatic peritoneal disease elsewhere in the abdomen pelvis.  No evidence of distant metastasis.   11/03/2020 Imaging   PET scan showed recurrent peritoneal metastasis near pancreas. Given that he has no other distant metastasis. Discussed with radiation oncology. Repeat SBRT may be considered if he does not respond well to systemic chemotherapy.     11/26/2020 -  Chemotherapy   5-FU and bevacizumab.   02/17/2021 Imaging   02/17/2021 CT abdomen pelvis showed partial response. Mild decrease of  peritoneal lesion.  Proceed with 5-FU and bevacizumab today.  Given that he is tolerating current regimen with good life quality, partial response.  Shared decision was made to hold off adding additional chemotherapy agents.   06/08/2021, CT chest abdomen pelvis with contrast showed soft tissue mass at the base of mesentery inferior to the pancreatic neck is unchanged in size.  No progressive disease was identified.   09/07/2021, CT chest abdomen pelvis with contrast showed no substantial changes in size of the mesenteric mass, 2.6 cm.  No suspicious new lesions.  Chronic findings as detailed in the imaging report.   12/05/2021 Imaging   PET scan showed soft  tissue mesenteric mass is unchanged in size.  Decreased FDG uptake compared to activity on November 03, 2020.  Fever treated disease.  No new metastatic disease.      Other medical problems Chronic lower extremity edema. 12/24/2018, right lower extremity duplex negative for DVT.  Small right Baker's cyst. 08/16/2019, bilateral lower extremity duplex showed no DVT. 08/16/2019 - 08/17/2019 with right lower extremity cellulitis.  He was unable to bear weight.  He was treated with IV fluids, NSAIDs, colchicine, and broad antibiotics (vancomycin and Cefepime).  He was discharged on indomethacin x 5 days and Keflex 500 mg TID x 5 days.  10/05/2020, colonoscopy showed internal hemorrhoids.  Otherwise normal examination.  INTERVAL HISTORY HANCEL ION is a 81 y.o. male who has above history reviewed by me today presents for follow up visit for management of recurrent metastatic colon cancer Patient was accompanied by wife.   Denies fever, chills, nausea, vomiting, diarrhea, chest pain, shortness of breath, abdominal pain, urinary symptoms.  Patient reports no new complaints.  Constipation is controlled.    Review of Systems  Constitutional:  Negative for appetite change, chills, diaphoresis, fever and unexpected weight change.  HENT:   Negative for hearing loss, nosebleeds, sore throat and tinnitus.   Respiratory:  Negative for cough, hemoptysis and shortness of breath.   Cardiovascular:  Negative for chest pain and palpitations.  Gastrointestinal:  Negative for abdominal pain, blood in stool, constipation, diarrhea, nausea and vomiting.  Genitourinary:  Negative for dysuria, frequency and hematuria.   Musculoskeletal:  Positive for arthralgias. Negative for back pain, myalgias and neck pain.  Skin:  Negative for itching and rash.  Neurological:  Positive for numbness. Negative for dizziness and headaches.  Hematological:  Does not bruise/bleed easily.  Psychiatric/Behavioral:  Negative for  depression. The patient is not nervous/anxious.       Past Medical History:  Diagnosis Date   Arthritis    OSTEOARTHRITIS   Cancer ([place])    Cavitary lesion of lung    RIGHT LOWER LOBE   Chicken pox    Colon cancer ([facility])    History of kidney stones    Hypertension    Lipoma of colon    Nephrolithiasis    Nephrolithiasis    Obesity    Shingles    Tubular adenoma of colon    multiple fragments    Past Surgical History:  Procedure Laterality Date   COLON SURGERY     COLONOSCOPY N/A 10/02/2014   Procedure: COLONOSCOPY;  Surgeon: Josefine Class, MD;  Location[facility] ENDOSCOPY;  Service: Endoscopy;  Laterality: N/A;   COLONOSCOPY WITH PROPOFOL N/A 02/16/2017   Procedure: COLONOSCOPY WITH PROPOFOL;  Surgeon: Manya Silvas, MD;  Location: [facility] ENDOSCOPY;  Service: Endoscopy;  Laterality: N/A;   COLONOSCOPY WITH PROPOFOL N/A 10/05/2020   Procedure: COLONOSCOPY WITH PROPOFOL;  Surgeon: Lesly Rubenstein, MD;  Location: [facility] ENDOSCOPY;  Service: Endoscopy;  Laterality: N/A;   EUS N/A 04/17/2019   Procedure: FULL UPPER ENDOSCOPIC ULTRASOUND (EUS) RADIAL;  Surgeon: Holly Bodily, MD;  Location: [facility] ENDOSCOPY;  Service: Gastroenterology;  Laterality: N/A;   KIDNEY STONE SURGERY     PARTIAL COLECTOMY  10/17/2013   PORTACATH PLACEMENT Right 06/13/2019   Procedure: INSERTION PORT-A-CATH;  Surgeon: Benjamine Sprague, DO;  Location: [facility] ORS;  Service: General;  Laterality: Right;    Family History  Problem Relation Age of Onset   Cancer Mother    Breast cancer Mother    COPD Father     Social History:  reports that he has never smoked. He has never used smokeless tobacco. He reports current alcohol use. He reports that he does not use drugs.    Allergies: No Known Allergies  Current Medications: Current Outpatient Medications  Medication Sig Dispense Refill   acetaminophen (TYLENOL) 500 MG tablet Take 500 mg by mouth every 6 (six) hours as needed.     amLODipine  (NORVASC) 2.5 MG tablet Take 2.5 mg by mouth daily.     aspirin EC 81 MG tablet Take 81 mg by mouth daily.     Calcium Carbonate (CALCIUM 600 PO) Take 1 tablet by mouth 2 (two) times daily.     Cholecalciferol (VITAMIN D) 125 MCG (5000 UT) CAPS Take 5,000 mg by mouth.     docusate sodium (COLACE) 100 MG capsule Take 1 capsule (100 mg total) by mouth 2 (two) times daily as needed for mild constipation or moderate constipation. 60 capsule 3   HYDROcodone-acetaminophen (NORCO/VICODIN) 5-325 MG tablet Take 1 tablet by mouth every 6 (six) hours as needed for moderate pain (up to 3 doses for moderate pain.).     KLOR-CON M20 20 MEQ tablet TAKE 1 TABLET BY MOUTH TWICE A DAY 180 tablet 1   lidocaine-prilocaine (EMLA) cream APPLY TO AFFECTED AREA ONCE AS DIRECTED 30 g 3   loperamide (IMODIUM) 2 MG capsule Take 1 capsule (2 mg total) by mouth See admin instructions. Take 2 tablets after first loose stool,  then 1 tablet  after each loose stool; maximum: 8 tablets /day 60 capsule 0   loratadine (CLARITIN) 10 MG tablet Take 10 mg by mouth daily. (Patient not taking: Reported on 02/14/2022)     olmesartan (BENICAR) 20 MG tablet Take 1 tablet by mouth daily.     ondansetron (ZOFRAN) 8 MG tablet Take 1 tablet (8 mg total) by mouth 2 (two) times daily as needed for refractory nausea / vomiting. Start on day 3 after chemotherapy. 60 tablet 1   Probiotic Product (PROBIOTIC DAILY PO) Take 1 tablet by mouth daily.     prochlorperazine (COMPAZINE) 10 MG tablet Take 1 tablet (10 mg total) by mouth every 6 (six) hours as needed (NAUSEA). 30 tablet 1   No current facility-administered medications for this visit.   Facility-Administered Medications Ordered in Other Visits  Medication Dose Route Frequency Provider Last Rate Last Admin   0.9 %  sodium chloride infusion   Intravenous Once Corcoran, Melissa C, MD       0.9 %  sodium chloride infusion   Intravenous Continuous Nolon Stalls C, MD 10 mL/hr at 12/31/19 1000  New Bag at 10/05/20 1045   heparin lock flush 100 unit/mL  500 Units Intravenous Once Lequita Asal, MD        Performance status (ECOG): 1  Vitals Blood pressure 126/61, pulse 69,  resp. rate 18, height 5' 8"  (1.727 m), weight 242 lb (109.8 kg), SpO2 100 %.   Physical Exam Vitals and nursing note reviewed.  Constitutional:      General: He is not in acute distress.    Appearance: He is well-developed. He is obese. He is not diaphoretic.     Interventions: Face mask in place.  HENT:     Head: Normocephalic and atraumatic.  Eyes:     General: No scleral icterus.    Extraocular Movements: Extraocular movements intact.     Conjunctiva/sclera: Conjunctivae normal.     Pupils: Pupils are equal, round, and reactive to light.  Cardiovascular:     Rate and Rhythm: Normal rate and regular rhythm.     Heart sounds: Normal heart sounds. No murmur heard. Pulmonary:     Effort: Pulmonary effort is normal. No respiratory distress.     Breath sounds: No wheezing.     Comments: Decreased breath sound bilaterally Abdominal:     General: There is no distension.     Palpations: Abdomen is soft. There is no mass.     Tenderness: There is no abdominal tenderness.  Musculoskeletal:        General: No swelling or tenderness. Normal range of motion.     Cervical back: Normal range of motion and neck supple.     Comments: Trace edema bilaterally.   Lymphadenopathy:     Head:     Right side of head: No preauricular, posterior auricular or occipital adenopathy.     Left side of head: No preauricular, posterior auricular or occipital adenopathy.     Cervical: No cervical adenopathy.     Upper Body:     Right upper body: No supraclavicular or axillary adenopathy.     Left upper body: No supraclavicular or axillary adenopathy.     Lower Body: No right inguinal adenopathy. No left inguinal adenopathy.  Skin:    General: Skin is warm and dry.  Neurological:     Mental Status: He is alert and  oriented to person, place, and time. Mental status is at baseline.  Psychiatric:        Mood and Affect: Mood normal.    Labs were reviewed by me.     Latest Ref Rng & Units 02/28/2022    8:16 AM 02/14/2022    8:35 AM 01/31/2022    8:17 AM  CBC  WBC 4.0 - 10.5 K/uL 4.7  5.5  4.7   Hemoglobin 13.0 - 17.0 g/dL 11.9  11.8  12.0   Hematocrit 39.0 - 52.0 % 35.8  35.7  35.9   Platelets 150 - 400 K/uL 157  152  152       Latest Ref Rng & Units 02/28/2022    8:16 AM 02/14/2022    8:35 AM 01/31/2022    8:17 AM  CMP  Glucose 70 - 99 mg/dL 128  110  114   BUN 8 - 23 mg/dL 10  11  12    Creatinine 0.61 - 1.24 mg/dL 0.88  0.65  0.72   Sodium 135 - 145 mmol/L 137  139  134   Potassium 3.5 - 5.1 mmol/L 3.7  3.6  3.7   Chloride 98 - 111 mmol/L 105  107  106   CO2 22 - 32 mmol/L 26  26  25    Calcium 8.9 - 10.3 mg/dL 9.3  9.1  9.1   Total Protein 6.5 - 8.1 g/dL 6.5  6.4  6.6  Total Bilirubin 0.3 - 1.2 mg/dL 1.4  0.8  1.1   Alkaline Phos 38 - 126 U/L 39  38  37   AST 15 - 41 U/L 34  33  36   ALT 0 - 44 U/L 19  18  21

## 2022-02-28 NOTE — Progress Notes (Signed)
Per MD ok to decrease MVASI dose due to weight change

## 2022-02-28 NOTE — Patient Instructions (Signed)
[facility]CER CTR AT [place]  Discharge Instructions: Thank you for choosing [facility] to provide your oncology and hematology care.  If you have a lab appointment with the [place], please go directly to the [place] and check in at the registration area.  Wear comfortable clothing and clothing appropriate for easy access to any Portacath or PICC line.   We strive to give you quality time with your provider. You may need to reschedule your appointment if you arrive late (15 or more minutes).  Arriving late affects you and other patients whose appointments are after yours.  Also, if you miss three or more appointments without notifying the office, you may be dismissed from the clinic at the provider's discretion.      For prescription refill requests, have your pharmacy contact our office and allow 72 hours for refills to be completed.    Today you received the following chemotherapy and/or immunotherapy agents: Avastin, Adrucil      To help prevent nausea and vomiting after your treatment, we encourage you to take your nausea medication as directed.  BELOW ARE SYMPTOMS THAT SHOULD BE REPORTED IMMEDIATELY: *FEVER GREATER THAN 100.4 F (38 C) OR HIGHER *CHILLS OR SWEATING *NAUSEA AND VOMITING THAT IS NOT CONTROLLED WITH YOUR NAUSEA MEDICATION *UNUSUAL SHORTNESS OF BREATH *UNUSUAL BRUISING OR BLEEDING *URINARY PROBLEMS (pain or burning when urinating, or frequent urination) *BOWEL PROBLEMS (unusual diarrhea, constipation, pain near the anus) TENDERNESS IN MOUTH AND THROAT WITH OR WITHOUT PRESENCE OF ULCERS (sore throat, sores in mouth, or a toothache) UNUSUAL RASH, SWELLING OR PAIN  UNUSUAL VAGINAL DISCHARGE OR ITCHING   Items with * indicate a potential emergency and should be followed up as soon as possible or go to the Emergency Department if any problems should occur.  Please show the CHEMOTHERAPY ALERT CARD or IMMUNOTHERAPY ALERT CARD at  check-in to the Emergency Department and triage nurse.  Should you have questions after your visit or need to cancel or reschedule your appointment, please contact [facility] CANCER [place] AT [place]  [phone] and follow the prompts.  Office hours are 8:00 a.m. to 4:30 p.m. Monday - Friday. Please note that voicemails left after 4:00 p.m. may not be returned until the following business day.  We are closed weekends and major holidays. You have access to a nurse at all times for urgent questions. Please call the main number to the clinic [phone] and follow the prompts.  For any non-urgent questions, you may also contact your provider using MyChart. We now offer e-Visits for anyone 65 and older to request care online for non-urgent symptoms. For details visit [email or web address].   Also download the MyChart app! Go to the app store, search "MyChart", open the app, select [place], and log in with your MyChart username and password.  Masks are optional in the cancer centers. If you would like for your care team to wear a mask while they are taking care of you, please let them know. For doctor visits, patients may have with them one support person who is at least 81 years old. At this time, visitors are not allowed in the infusion area.

## 2022-03-02 ENCOUNTER — Inpatient Hospital Stay: Payer: Medicare Other

## 2022-03-02 ENCOUNTER — Other Ambulatory Visit: Payer: Self-pay

## 2022-03-02 DIAGNOSIS — C786 Secondary malignant neoplasm of retroperitoneum and peritoneum: Secondary | ICD-10-CM

## 2022-03-02 DIAGNOSIS — Z85038 Personal history of other malignant neoplasm of large intestine: Secondary | ICD-10-CM

## 2022-03-02 DIAGNOSIS — Z5112 Encounter for antineoplastic immunotherapy: Secondary | ICD-10-CM | POA: Diagnosis not present

## 2022-03-02 LAB — CEA: CEA: 13.5 ng/mL — ABNORMAL HIGH (ref 0.0–4.7)

## 2022-03-02 MED ORDER — HEPARIN SOD (PORK) LOCK FLUSH 100 UNIT/ML IV SOLN
500.0000 [IU] | Freq: Once | INTRAVENOUS | Status: AC | PRN
  Administered 2022-03-02: 500 [IU]
  Filled 2022-03-02: qty 5

## 2022-03-02 MED ORDER — SODIUM CHLORIDE 0.9% FLUSH
10.0000 mL | INTRAVENOUS | Status: DC | PRN
  Administered 2022-03-02: 10 mL
  Filled 2022-03-02: qty 10

## 2022-03-13 MED FILL — Dexamethasone Sodium Phosphate Inj 100 MG/10ML: INTRAMUSCULAR | Qty: 1 | Status: AC

## 2022-03-14 ENCOUNTER — Encounter: Payer: Self-pay | Admitting: Oncology

## 2022-03-14 ENCOUNTER — Inpatient Hospital Stay: Payer: Medicare Other

## 2022-03-14 ENCOUNTER — Inpatient Hospital Stay (HOSPITAL_BASED_OUTPATIENT_CLINIC_OR_DEPARTMENT_OTHER): Payer: Medicare Other | Admitting: Oncology

## 2022-03-14 ENCOUNTER — Other Ambulatory Visit: Payer: Self-pay

## 2022-03-14 DIAGNOSIS — Z85038 Personal history of other malignant neoplasm of large intestine: Secondary | ICD-10-CM | POA: Diagnosis not present

## 2022-03-14 DIAGNOSIS — C786 Secondary malignant neoplasm of retroperitoneum and peritoneum: Secondary | ICD-10-CM

## 2022-03-14 DIAGNOSIS — T451X5A Adverse effect of antineoplastic and immunosuppressive drugs, initial encounter: Secondary | ICD-10-CM

## 2022-03-14 DIAGNOSIS — D6481 Anemia due to antineoplastic chemotherapy: Secondary | ICD-10-CM

## 2022-03-14 DIAGNOSIS — G620 Drug-induced polyneuropathy: Secondary | ICD-10-CM | POA: Diagnosis not present

## 2022-03-14 DIAGNOSIS — C184 Malignant neoplasm of transverse colon: Secondary | ICD-10-CM

## 2022-03-14 DIAGNOSIS — Z5111 Encounter for antineoplastic chemotherapy: Secondary | ICD-10-CM

## 2022-03-14 DIAGNOSIS — Z5112 Encounter for antineoplastic immunotherapy: Secondary | ICD-10-CM | POA: Diagnosis not present

## 2022-03-14 LAB — CBC WITH DIFFERENTIAL/PLATELET
Abs Immature Granulocytes: 0.01 10*3/uL (ref 0.00–0.07)
Basophils Absolute: 0.1 10*3/uL (ref 0.0–0.1)
Basophils Relative: 1 %
Eosinophils Absolute: 0.1 10*3/uL (ref 0.0–0.5)
Eosinophils Relative: 1 %
HCT: 36.8 % — ABNORMAL LOW (ref 39.0–52.0)
Hemoglobin: 11.8 g/dL — ABNORMAL LOW (ref 13.0–17.0)
Immature Granulocytes: 0 %
Lymphocytes Relative: 31 %
Lymphs Abs: 1.5 10*3/uL (ref 0.7–4.0)
MCH: 30.5 pg (ref 26.0–34.0)
MCHC: 32.1 g/dL (ref 30.0–36.0)
MCV: 95.1 fL (ref 80.0–100.0)
Monocytes Absolute: 0.6 10*3/uL (ref 0.1–1.0)
Monocytes Relative: 12 %
Neutro Abs: 2.7 10*3/uL (ref 1.7–7.7)
Neutrophils Relative %: 55 %
Platelets: 178 10*3/uL (ref 150–400)
RBC: 3.87 MIL/uL — ABNORMAL LOW (ref 4.22–5.81)
RDW: 15.3 % (ref 11.5–15.5)
WBC: 4.9 10*3/uL (ref 4.0–10.5)
nRBC: 0 % (ref 0.0–0.2)

## 2022-03-14 LAB — COMPREHENSIVE METABOLIC PANEL
ALT: 17 U/L (ref 0–44)
AST: 33 U/L (ref 15–41)
Albumin: 3.6 g/dL (ref 3.5–5.0)
Alkaline Phosphatase: 39 U/L (ref 38–126)
Anion gap: 7 (ref 5–15)
BUN: 16 mg/dL (ref 8–23)
CO2: 23 mmol/L (ref 22–32)
Calcium: 9 mg/dL (ref 8.9–10.3)
Chloride: 104 mmol/L (ref 98–111)
Creatinine, Ser: 0.69 mg/dL (ref 0.61–1.24)
GFR, Estimated: >60 mL/min (ref >60)
Glucose, Bld: 91 mg/dL (ref 70–99)
Potassium: 3.9 mmol/L (ref 3.5–5.1)
Sodium: 134 mmol/L — ABNORMAL LOW (ref 135–145)
Total Bilirubin: 1.1 mg/dL (ref 0.3–1.2)
Total Protein: 6.6 g/dL (ref 6.5–8.1)

## 2022-03-14 LAB — PROTEIN, URINE, RANDOM: Total Protein, Urine: 12 mg/dL

## 2022-03-14 MED ORDER — SODIUM CHLORIDE 0.9 % IV SOLN
900.0000 mg | Freq: Once | INTRAVENOUS | Status: AC
  Administered 2022-03-14: 900 mg via INTRAVENOUS
  Filled 2022-03-14: qty 45

## 2022-03-14 MED ORDER — FLUOROURACIL CHEMO INJECTION 2.5 GM/50ML
400.0000 mg/m2 | Freq: Once | INTRAVENOUS | Status: DC
  Filled 2022-03-14: qty 18

## 2022-03-14 MED ORDER — SODIUM CHLORIDE 0.9 % IV SOLN
Freq: Once | INTRAVENOUS | Status: AC
  Filled 2022-03-14: qty 250

## 2022-03-14 MED ORDER — SODIUM CHLORIDE 0.9 % IV SOLN
5.0000 mg/kg | Freq: Once | INTRAVENOUS | Status: DC
  Filled 2022-03-14: qty 20

## 2022-03-14 MED ORDER — FLUOROURACIL CHEMO INJECTION 2.5 GM/50ML
400.0000 mg/m2 | Freq: Once | INTRAVENOUS | Status: AC
  Administered 2022-03-14: 900 mg via INTRAVENOUS
  Filled 2022-03-14: qty 18

## 2022-03-14 MED ORDER — SODIUM CHLORIDE 0.9 % IV SOLN
900.0000 mg | Freq: Once | INTRAVENOUS | Status: DC
  Filled 2022-03-14: qty 45

## 2022-03-14 MED ORDER — SODIUM CHLORIDE 0.9 % IV SOLN
10.0000 mg | Freq: Once | INTRAVENOUS | Status: DC
  Administered 2022-03-14: 10 mg via INTRAVENOUS
  Filled 2022-03-14: qty 10

## 2022-03-14 MED ORDER — SODIUM CHLORIDE 0.9 % IV SOLN
2400.0000 mg/m2 | INTRAVENOUS | Status: DC
  Administered 2022-03-14: 5500 mg via INTRAVENOUS
  Filled 2022-03-14: qty 110

## 2022-03-14 MED ORDER — SODIUM CHLORIDE 0.9 % IV SOLN
5.0000 mg/kg | Freq: Once | INTRAVENOUS | Status: AC
  Administered 2022-03-14: 500 mg via INTRAVENOUS
  Filled 2022-03-14: qty 20

## 2022-03-14 MED ORDER — SODIUM CHLORIDE 0.9 % IV SOLN
2400.0000 mg/m2 | INTRAVENOUS | Status: DC
  Filled 2022-03-14: qty 110

## 2022-03-14 NOTE — Assessment & Plan Note (Signed)
Patient has gabapentin supply at home.Patient declines gabapentin and acupuncture.

## 2022-03-14 NOTE — Assessment & Plan Note (Signed)
Overall he tolerates chemotherapy.   CEA is relatively stable with small fluctuations. Labs reviewed and discussed with patient Proceed with 5-FU/bevacizumab treatment today Continue current regimen.

## 2022-03-14 NOTE — Progress Notes (Signed)
Pt reports he has been having slight bleeding when he blows his nose.

## 2022-03-14 NOTE — Progress Notes (Signed)
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   CEA is relatively stable with small fluctuations. Labs reviewed and discussed with patient Proceed with 5-FU/bevacizumab treatment today Continue current regimen.  Chemotherapy-induced neuropathy ([place]) Patient has gabapentin supply at home.Patient declines gabapentin and acupuncture.   Anemia due to chemotherapy Mild anemia. continue to monitor.  Hemoglobin is close to baseline.  Encounter for antineoplastic chemotherapy Chemotherapy plan as listed above.   Mild nosebleeding likely due to dry..  Encourage nasal gel topically, humidifier Follow-up 2 weeks lab MD 5-FU/bevacizumab -   All questions were answered. The patient knows to call the clinic with any problems, questions or concerns.  [name], MD, PhD [facility] Hematology Oncology 03/14/2022   Chief Complaint: Jared Gutierrez is a 81 y.o. male presents for follow-up of metastatic colon cancer   PERTINENT ONCOLOGY HISTORY Jared Gutierrez is a 81 y.o.[place] who has above oncology history reviewed by me today presented for follow up visit for management of Metastatic colon cancer. [name], patient switched care to me on 11/11/20 Extensive medical record review was performed by me  Oncology History  History of colon cancer, stage I  10/02/2014 Pathology Results   [id number] colonoscopy pathology A. CECAL POLYPS; HOT SNARE AND COLD BIOPSY:  - TUBULAR ADENOMA, MULTIPLE FRAGMENTS.  - NEGATIVE FOR HIGH-GRADE DYSPLASIA AND MALIGNANCY.   B. COLON POLYP, TRANSVERSE; COLD BIOPSY:  - TUBULAR  ADENOMA.  - NEGATIVE FOR HIGH-GRADE DYSPLASIA AND MALIGNANCY.     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  Tumor extended into the submucosa. Margins were negative. + lymphovascular invasion. 14 lymph nodes were negative. Pathologic stage was T1 N0.  TRANSVERSE COLON, RESECTION:  - INVASIVE ADENOCARCINOMA ARISING IN A 4.6 CM TUBULOVILLOUS  ADENOMA WITH HIGH GRADE DYSPLASIA (MALIGNANT POLYP).  - SEE SUMMARY BELOW.  [name]  ONCOLOGY SUMMARY: COLON AND RECTUM, RESECTION, AJCC 7TH EDITION  Specimen: transverse colon  Procedure: transverse colon resection  Tumor site: splenic flexure  Tumor size: 1.0 cm (invasive component)  Macroscopic tumor perforation: not specified  Histologic type: invasive adenocarcinoma  Histologic grade: moderately differentiated  Microscopic tumor extension: into submucosa  Margins:       Proximal margin: negative       Distal margin: negative       Circumferential (radial) or mesenteric margin: negative  If all margins uninvolved by invasive carcinoma:       Distance of invasive carcinoma from closest margin: 7.5 cm  to distal margin  Treatment effect: not applicable  Lymph-vascular invasion: present   Perineural invasion: not identified  Tumor deposits (discontinuous extramural extension): not  identified  Pathologic staging:       Primary tumor: pT1       Regional lymph nodes: pN0            Number of nodes examined: 14            Number of nodes involved: 0    04/02/2019 Progression   04/02/2019  PET scan  limited evealed a 2.4 x 2.3 cm (SUV 11) hypermetabolic soft tissue density caudal and anterior to the pancreatic neck favored to represent isolated peritoneal or nodal metastasis in the setting of prior transverse colonic resection (expected primary drainage). Although this was immediately adjacent to the pancreas, a fat plane was maintained, arguing strongly against a pancreatic primary. Otherwise, there was no evidence of hypermetabolic metastasis. 04/17/2019 EUS on  revealed a normal esophagus, stomach, duodenum, and pancreas.  There was a 2.4 x 2.4 cm irregular mass in the retroperitoneum adjacent to, but not involving the pancreatic neck.  FNA and core needle biopsy were performed.  Pathology revealed adenocarcinoma compatible with a metastatic lesion of colorectal origin.  Tumor cells were positive for CK20 and CDX2 and negative for CK7.  NGS: [employer] on 05/15/2019 revealed + KRASG13D and TP53.  Negative results included BRAF V600E, Her2, NRAS, NTRK, PD-L1 (<1%), and TMB 8.7/Mb (intermediate).  MMR testing from his colon resection on 10/16/2013 was intact with a low probability of MSI-H.   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  He received Neulasta after cycle #4 and #5 secondary to progressive leukopenia.  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  He received a truncated course of FOLFOX with cycle #11 secondary to oxaliplatin reaction.   01/14/2020 Imaging    PET revealed an interval decrease in size and FDG uptake  (2.4 x 2.3 cm with SUV 11 to 2.4 x 1.7 cm with SUV 4.09) associated with the previously referenced soft tissue density caudal and anterior to the pancreatic neck suggesting treatment response. There were no new sites of FDG avid tumor.   08/31/2020 Imaging   08/31/2020 Abdomen and pelvis CT revealed increased size of the index soft tissue lesion inferior and anterior to the pancreatic neck (2.9 x 1.8 cm to 3.5 x 2.9 cm). There were similar prominent retroperitoneal lymph nodes without adenopathy by size criteria. There was no new or enlarging abdominal or pelvic lymph nodes. There were no new interval findings. There was similar circumferential wall thickening of a nondistended urinary bladder, which likely accentuated wall thickening There was hepatic steatosis and aortic atherosclerosis.   11/03/2020, PET showed recurrent peritoneal metastasis in the upper abdomen adjacent to the pancreas.  The lesion is 3.8 x 2.9 cm with SUV of 11.3. No evidence of metastatic peritoneal disease elsewhere in the abdomen pelvis.  No evidence of distant metastasis.   11/03/2020 Imaging   PET scan showed recurrent peritoneal metastasis near pancreas. Given that he has no other distant metastasis. Discussed with radiation oncology. Repeat SBRT may be considered if he does not respond well to systemic chemotherapy.     11/26/2020 -  Chemotherapy   5-FU and bevacizumab.   02/17/2021 Imaging   02/17/2021 CT abdomen pelvis showed partial response. Mild decrease of peritoneal lesion.  Proceed with 5-FU and bevacizumab today.  Given that he is tolerating current regimen with good life quality, partial response.  Shared decision was made to hold off adding additional chemotherapy agents.   06/08/2021, CT chest abdomen pelvis with contrast showed soft tissue mass at the base of mesentery inferior to the pancreatic neck is unchanged in size.  No progressive disease was identified.   09/07/2021, CT chest abdomen pelvis with  contrast showed no substantial changes in size of the mesenteric mass, 2.6 cm.  No suspicious new lesions.  Chronic findings as detailed in the imaging report.   12/05/2021 Imaging   PET scan showed soft tissue mesenteric mass is unchanged in size.  Decreased FDG uptake compared to activity on November 03, 2020.  Fever treated disease.  No new metastatic disease.      Other medical problems Chronic lower extremity edema. 12/24/2018, right lower extremity duplex negative for DVT.  Small right Baker's cyst. 08/16/2019, bilateral lower extremity duplex showed no DVT. 08/16/2019 - 08/17/2019 with right lower extremity cellulitis.  He was unable to bear weight.  He was treated with IV fluids, NSAIDs, colchicine, and broad antibiotics (vancomycin and Cefepime).  He was discharged on indomethacin x 5 days and Keflex 500 mg TID x 5 days.  10/05/2020, colonoscopy showed internal hemorrhoids.  Otherwise normal examination.  INTERVAL HISTORY Jared Gutierrez is a 81 y.o. male who has above history reviewed by me today presents for follow up visit for management of recurrent metastatic colon cancer Patient was accompanied by wife.   Denies fever, chills, nausea, vomiting, diarrhea, chest pain, shortness of breath, abdominal pain, urinary symptoms.  constipation is controlled.  + He has noticed small amount of blood when he wipes his nose.    Review of Systems  Constitutional:  Negative for appetite change, chills, diaphoresis, fever and unexpected weight change.  HENT:   Negative for hearing loss, nosebleeds, sore throat and tinnitus.   Respiratory:  Negative for cough, hemoptysis and shortness of breath.   Cardiovascular:  Negative for chest pain and palpitations.  Gastrointestinal:  Negative for abdominal pain, blood in stool, constipation, diarrhea, nausea and vomiting.  Genitourinary:  Negative for dysuria, frequency and hematuria.   Musculoskeletal:  Positive for arthralgias. Negative for back pain,  myalgias and neck pain.  Skin:  Negative for itching and rash.  Neurological:  Positive for numbness. Negative for dizziness and headaches.  Hematological:  Does not bruise/bleed easily.  Psychiatric/Behavioral:  Negative for depression. The patient is not nervous/anxious.       Past Medical History:  Diagnosis Date   Arthritis    OSTEOARTHRITIS   Cancer ([place])    Cavitary lesion of lung    RIGHT LOWER LOBE   Chicken pox    Colon cancer ([facility])    History of kidney stones    Hypertension    Lipoma of colon    Nephrolithiasis    Nephrolithiasis    Obesity    Shingles    Tubular adenoma of colon    multiple fragments    Past Surgical History:  Procedure Laterality Date   COLON SURGERY     COLONOSCOPY N/A 10/02/2014   Procedure: COLONOSCOPY;  Surgeon: Josefine Class, MD;  [facility] ENDOSCOPY;  Service: Endoscopy;  Laterality: N/A;   COLONOSCOPY WITH PROPOFOL N/A 02/16/2017   Procedure: COLONOSCOPY WITH  PROPOFOL;  Surgeon: Manya Silvas, MD;  Location: [facility] ENDOSCOPY;  Service: Endoscopy;  Laterality: N/A;   COLONOSCOPY WITH PROPOFOL N/A 10/05/2020   Procedure: COLONOSCOPY WITH PROPOFOL;  Surgeon: Lesly Rubenstein, MD;  Location: [facility] ENDOSCOPY;  Service: Endoscopy;  Laterality: N/A;   EUS N/A 04/17/2019   Procedure: FULL UPPER ENDOSCOPIC ULTRASOUND (EUS) RADIAL;  Surgeon: Holly Bodily, MD;  Location: [facility] ENDOSCOPY;  Service: Gastroenterology;  Laterality: N/A;   KIDNEY STONE SURGERY     PARTIAL COLECTOMY  10/17/2013   PORTACATH PLACEMENT Right 06/13/2019   Procedure: INSERTION PORT-A-CATH;  Surgeon: Benjamine Sprague, DO;  Location: [facility] ORS;  Service: General;  Laterality: Right;    Family History  Problem Relation Age of Onset   Cancer Mother    Breast cancer Mother    COPD Father     Social History:  reports that he has never smoked. He has never used smokeless tobacco. He reports current alcohol use. He reports that he does not use drugs.     Allergies: No Known Allergies  Current Medications: Current Outpatient Medications  Medication Sig Dispense Refill   acetaminophen (TYLENOL) 500 MG tablet Take 500 mg by mouth every 6 (six) hours as needed.     amLODipine (NORVASC) 2.5 MG tablet Take 2.5 mg by mouth daily.     aspirin EC 81 MG tablet Take 81 mg by mouth daily.     Calcium Carbonate (CALCIUM 600 PO) Take 1 tablet by mouth 2 (two) times daily.     Cholecalciferol (VITAMIN D) 125 MCG (5000 UT) CAPS Take 5,000 mg by mouth.     docusate sodium (COLACE) 100 MG capsule Take 1 capsule (100 mg total) by mouth 2 (two) times daily as needed for mild constipation or moderate constipation. 60 capsule 3   HYDROcodone-acetaminophen (NORCO/VICODIN) 5-325 MG tablet Take 1 tablet by mouth every 6 (six) hours as needed for moderate pain (up to 3 doses for moderate pain.).     KLOR-CON M20 20 MEQ tablet TAKE 1 TABLET BY MOUTH TWICE A DAY 180 tablet 1   lidocaine-prilocaine (EMLA) cream APPLY TO AFFECTED AREA ONCE AS DIRECTED 30 g 3   loperamide (IMODIUM) 2 MG capsule Take 1 capsule (2 mg total) by mouth See admin instructions. Take 2 tablets after first loose stool,  then 1 tablet  after each loose stool; maximum: 8 tablets /day 60 capsule 0   loratadine (CLARITIN) 10 MG tablet Take 10 mg by mouth daily.     olmesartan (BENICAR) 20 MG tablet Take 1 tablet by mouth daily.     ondansetron (ZOFRAN) 8 MG tablet Take 1 tablet (8 mg total) by mouth 2 (two) times daily as needed for refractory nausea / vomiting. Start on day 3 after chemotherapy. 60 tablet 1   Probiotic Product (PROBIOTIC DAILY PO) Take 1 tablet by mouth daily.     prochlorperazine (COMPAZINE) 10 MG tablet Take 1 tablet (10 mg total) by mouth every 6 (six) hours as needed (NAUSEA). 30 tablet 1   No current facility-administered medications for this visit.   Facility-Administered Medications Ordered in Other Visits  Medication Dose Route Frequency Provider Last Rate Last Admin    0.9 %  sodium chloride infusion   Intravenous Once Corcoran, Melissa C, MD       0.9 %  sodium chloride infusion   Intravenous Continuous Nolon Stalls C, MD 10 mL/hr at 12/31/19 1000 New Bag at 10/05/20 1045   fluorouracil (ADRUCIL) 5,500 mg in sodium chloride  0.9 % 140 mL chemo infusion  2,400 mg/m2 (Treatment Plan Recorded) Intravenous 1 day or 1 dose [name], MD   Infusion Verify at 03/14/22 1133   heparin lock flush 100 unit/mL  500 Units Intravenous Once Lequita Asal, MD        Performance status (ECOG): 1  Vitals Blood pressure 123/76, pulse 66, temperature (!) 97.3 F (36.3 C), resp. rate 18, weight 243 lb 12.8 oz (110.6 kg).   Physical Exam Vitals and nursing note reviewed.  Constitutional:      General: He is not in acute distress.    Appearance: He is well-developed. He is obese. He is not diaphoretic.     Interventions: Face mask in place.  HENT:     Head: Normocephalic and atraumatic.  Eyes:     General: No scleral icterus.    Extraocular Movements: Extraocular movements intact.     Conjunctiva/sclera: Conjunctivae normal.     Pupils: Pupils are equal, round, and reactive to light.  Cardiovascular:     Rate and Rhythm: Normal rate and regular rhythm.     Heart sounds: Normal heart sounds. No murmur heard. Pulmonary:     Effort: Pulmonary effort is normal. No respiratory distress.     Breath sounds: No wheezing.     Comments: Decreased breath sound bilaterally Abdominal:     General: There is no distension.     Palpations: Abdomen is soft. There is no mass.     Tenderness: There is no abdominal tenderness.  Musculoskeletal:        General: No swelling or tenderness. Normal range of motion.     Cervical back: Normal range of motion and neck supple.     Comments: Trace edema bilaterally.   Lymphadenopathy:     Head:     Right side of head: No preauricular, posterior auricular or occipital adenopathy.     Left side of head: No preauricular, posterior  auricular or occipital adenopathy.     Cervical: No cervical adenopathy.     Upper Body:     Right upper body: No supraclavicular or axillary adenopathy.     Left upper body: No supraclavicular or axillary adenopathy.     Lower Body: No right inguinal adenopathy. No left inguinal adenopathy.  Skin:    General: Skin is warm and dry.  Neurological:     Mental Status: He is alert and oriented to person, place, and time. Mental status is at baseline.  Psychiatric:        Mood and Affect: Mood normal.    Labs were reviewed by me.     Latest Ref Rng & Units 03/14/2022    8:02 AM 02/28/2022    8:16 AM 02/14/2022    8:35 AM  CBC  WBC 4.0 - 10.5 K/uL 4.9  4.7  5.5   Hemoglobin 13.0 - 17.0 g/dL 11.8  11.9  11.8   Hematocrit 39.0 - 52.0 % 36.8  35.8  35.7   Platelets 150 - 400 K/uL 178  157  152       Latest Ref Rng & Units 03/14/2022    8:02 AM 02/28/2022    8:16 AM 02/14/2022    8:35 AM  CMP  Glucose 70 - 99 mg/dL 91  128  110   BUN 8 - 23 mg/dL _0 Creatinine 0.61 - 1.24 mg/dL 0.69  0.88  0.65   Sodium 135 - 145 mmol/L 134  137  139  Potassium 3.5 - 5.1 mmol/L 3.9  3.7  3.6   Chloride 98 - 111 mmol/L 104  105  107   CO2 22 - 32 mmol/L _0 Calcium 8.9 - 10.3 mg/dL 9.0  9.3  9.1   Total Protein 6.5 - 8.1 g/dL 6.6  6.5  6.4   Total Bilirubin 0.3 - 1.2 mg/dL 1.1  1.4  0.8   Alkaline Phos 38 - 126 U/L 39  39  38   AST 15 - 41 U/L 33  34  33   ALT 0 - 44 U/L 17  19  18

## 2022-03-14 NOTE — Addendum Note (Signed)
Addended by: Meribeth Mattes H on: 03/14/2022 11:55 AM   Modules accepted: Orders

## 2022-03-14 NOTE — Assessment & Plan Note (Signed)
Chemotherapy plan as listed above 

## 2022-03-14 NOTE — Addendum Note (Signed)
Addended by: Charlyn Minerva on: 03/14/2022 10:16 AM   Modules accepted: Orders

## 2022-03-14 NOTE — Assessment & Plan Note (Signed)
Mild anemia. continue to monitor.  Hemoglobin is close to baseline. 

## 2022-03-14 NOTE — Progress Notes (Signed)
Nutrition Follow-up:   Patient with metastatic colon cancer.  S/p transverse colectomy (2015).  Patient receiving 5-FU and bevacizumab.    Met with patient during infusion.  Reports that he has a good appetite.  Eating mostly 2 meals a day.  Yesterday able to eat sausage and gravy for breakfast.  Had steak and baked potato for dinner last night.  Drinks ensure typically in the am/daily.  Takes miralax daily and helps with constipation.  Denies any nutrition impact symptoms at this time.     Medications: reviewed  Labs: reviewed  Anthropometrics:   Weight 243 lb 12.8 oz on 10/31 244 lb on 10/3 240 lb on 8/22 262 lb on 05/27/21  NUTRITION DIAGNOSIS: Unintentional weight loss stable    INTERVENTION:  Continue oral nutrition supplement Continue to encouraged high protein foods and weight maintenance    MONITORING, EVALUATION, GOAL: weight trends, intake   NEXT VISIT: as needed  Jameshia Hayashida B. Zenia Resides, [place], [place] Registered Dietitian [phone]

## 2022-03-15 LAB — CEA: CEA: 13.3 ng/mL — ABNORMAL HIGH (ref 0.0–4.7)

## 2022-03-16 ENCOUNTER — Inpatient Hospital Stay: Payer: Medicare Other | Attending: Oncology

## 2022-03-16 VITALS — BP 156/67 | HR 65 | Resp 18

## 2022-03-16 DIAGNOSIS — C184 Malignant neoplasm of transverse colon: Secondary | ICD-10-CM | POA: Insufficient documentation

## 2022-03-16 DIAGNOSIS — Z836 Family history of other diseases of the respiratory system: Secondary | ICD-10-CM | POA: Insufficient documentation

## 2022-03-16 DIAGNOSIS — Z809 Family history of malignant neoplasm, unspecified: Secondary | ICD-10-CM | POA: Diagnosis not present

## 2022-03-16 DIAGNOSIS — N400 Enlarged prostate without lower urinary tract symptoms: Secondary | ICD-10-CM | POA: Insufficient documentation

## 2022-03-16 DIAGNOSIS — Z23 Encounter for immunization: Secondary | ICD-10-CM | POA: Diagnosis not present

## 2022-03-16 DIAGNOSIS — Z452 Encounter for adjustment and management of vascular access device: Secondary | ICD-10-CM | POA: Diagnosis not present

## 2022-03-16 DIAGNOSIS — Z803 Family history of malignant neoplasm of breast: Secondary | ICD-10-CM | POA: Insufficient documentation

## 2022-03-16 DIAGNOSIS — D6481 Anemia due to antineoplastic chemotherapy: Secondary | ICD-10-CM | POA: Diagnosis not present

## 2022-03-16 DIAGNOSIS — Z79899 Other long term (current) drug therapy: Secondary | ICD-10-CM | POA: Diagnosis not present

## 2022-03-16 DIAGNOSIS — T451X5D Adverse effect of antineoplastic and immunosuppressive drugs, subsequent encounter: Secondary | ICD-10-CM | POA: Insufficient documentation

## 2022-03-16 DIAGNOSIS — Z5112 Encounter for antineoplastic immunotherapy: Secondary | ICD-10-CM | POA: Diagnosis not present

## 2022-03-16 DIAGNOSIS — Z5111 Encounter for antineoplastic chemotherapy: Secondary | ICD-10-CM | POA: Insufficient documentation

## 2022-03-16 DIAGNOSIS — C786 Secondary malignant neoplasm of retroperitoneum and peritoneum: Secondary | ICD-10-CM | POA: Insufficient documentation

## 2022-03-16 DIAGNOSIS — Z86010 Personal history of colonic polyps: Secondary | ICD-10-CM | POA: Insufficient documentation

## 2022-03-16 DIAGNOSIS — Z85038 Personal history of other malignant neoplasm of large intestine: Secondary | ICD-10-CM

## 2022-03-16 DIAGNOSIS — G620 Drug-induced polyneuropathy: Secondary | ICD-10-CM | POA: Insufficient documentation

## 2022-03-16 MED ORDER — SODIUM CHLORIDE 0.9% FLUSH
10.0000 mL | INTRAVENOUS | Status: DC | PRN
  Administered 2022-03-16: 10 mL
  Filled 2022-03-16: qty 10

## 2022-03-16 MED ORDER — HEPARIN SOD (PORK) LOCK FLUSH 100 UNIT/ML IV SOLN
500.0000 [IU] | Freq: Once | INTRAVENOUS | Status: AC | PRN
  Administered 2022-03-16: 500 [IU]
  Filled 2022-03-16: qty 5

## 2022-03-20 ENCOUNTER — Ambulatory Visit
Admission: RE | Admit: 2022-03-20 | Discharge: 2022-03-20 | Disposition: A | Discharge status: Home / Self Care | Payer: Medicare Other | Source: Ambulatory Visit | Attending: Oncology | Admitting: Oncology

## 2022-03-20 DIAGNOSIS — T451X5A Adverse effect of antineoplastic and immunosuppressive drugs, initial encounter: Secondary | ICD-10-CM | POA: Diagnosis present

## 2022-03-20 DIAGNOSIS — C184 Malignant neoplasm of transverse colon: Secondary | ICD-10-CM | POA: Diagnosis present

## 2022-03-20 DIAGNOSIS — Z5111 Encounter for antineoplastic chemotherapy: Secondary | ICD-10-CM | POA: Diagnosis present

## 2022-03-20 DIAGNOSIS — G620 Drug-induced polyneuropathy: Secondary | ICD-10-CM | POA: Diagnosis present

## 2022-03-20 MED ORDER — IOHEXOL 300 MG/ML  SOLN
100.0000 mL | Freq: Once | INTRAMUSCULAR | Status: AC | PRN
  Administered 2022-03-20: 100 mL via INTRAVENOUS

## 2022-03-27 MED FILL — Dexamethasone Sodium Phosphate Inj 100 MG/10ML: INTRAMUSCULAR | Qty: 1 | Status: AC

## 2022-03-28 ENCOUNTER — Inpatient Hospital Stay (HOSPITAL_BASED_OUTPATIENT_CLINIC_OR_DEPARTMENT_OTHER): Payer: Medicare Other | Admitting: Oncology

## 2022-03-28 ENCOUNTER — Encounter: Payer: Self-pay | Admitting: Oncology

## 2022-03-28 ENCOUNTER — Inpatient Hospital Stay: Payer: Medicare Other

## 2022-03-28 VITALS — BP 116/69 | HR 70 | Temp 98.5°F | Wt 242.2 lb

## 2022-03-28 DIAGNOSIS — C786 Secondary malignant neoplasm of retroperitoneum and peritoneum: Secondary | ICD-10-CM | POA: Diagnosis not present

## 2022-03-28 DIAGNOSIS — Z85038 Personal history of other malignant neoplasm of large intestine: Secondary | ICD-10-CM

## 2022-03-28 DIAGNOSIS — Z5112 Encounter for antineoplastic immunotherapy: Secondary | ICD-10-CM | POA: Diagnosis not present

## 2022-03-28 DIAGNOSIS — Z5111 Encounter for antineoplastic chemotherapy: Secondary | ICD-10-CM | POA: Diagnosis not present

## 2022-03-28 DIAGNOSIS — C184 Malignant neoplasm of transverse colon: Secondary | ICD-10-CM | POA: Diagnosis not present

## 2022-03-28 DIAGNOSIS — N400 Enlarged prostate without lower urinary tract symptoms: Secondary | ICD-10-CM | POA: Insufficient documentation

## 2022-03-28 DIAGNOSIS — T451X5A Adverse effect of antineoplastic and immunosuppressive drugs, initial encounter: Secondary | ICD-10-CM

## 2022-03-28 DIAGNOSIS — D6481 Anemia due to antineoplastic chemotherapy: Secondary | ICD-10-CM

## 2022-03-28 DIAGNOSIS — Z23 Encounter for immunization: Secondary | ICD-10-CM | POA: Insufficient documentation

## 2022-03-28 LAB — CBC WITH DIFFERENTIAL/PLATELET
Abs Immature Granulocytes: 0.01 10*3/uL (ref 0.00–0.07)
Basophils Absolute: 0 10*3/uL (ref 0.0–0.1)
Basophils Relative: 1 %
Eosinophils Absolute: 0.1 10*3/uL (ref 0.0–0.5)
Eosinophils Relative: 2 %
HCT: 36.9 % — ABNORMAL LOW (ref 39.0–52.0)
Hemoglobin: 12.1 g/dL — ABNORMAL LOW (ref 13.0–17.0)
Immature Granulocytes: 0 %
Lymphocytes Relative: 35 %
Lymphs Abs: 1.6 10*3/uL (ref 0.7–4.0)
MCH: 30.8 pg (ref 26.0–34.0)
MCHC: 32.8 g/dL (ref 30.0–36.0)
MCV: 93.9 fL (ref 80.0–100.0)
Monocytes Absolute: 0.5 10*3/uL (ref 0.1–1.0)
Monocytes Relative: 11 %
Neutro Abs: 2.4 10*3/uL (ref 1.7–7.7)
Neutrophils Relative %: 51 %
Platelets: 166 10*3/uL (ref 150–400)
RBC: 3.93 MIL/uL — ABNORMAL LOW (ref 4.22–5.81)
RDW: 15.3 % (ref 11.5–15.5)
WBC: 4.5 10*3/uL (ref 4.0–10.5)
nRBC: 0 % (ref 0.0–0.2)

## 2022-03-28 LAB — PROTEIN, URINE, RANDOM: Total Protein, Urine: 21 mg/dL

## 2022-03-28 LAB — COMPREHENSIVE METABOLIC PANEL
ALT: 17 U/L (ref 0–44)
AST: 36 U/L (ref 15–41)
Albumin: 3.5 g/dL (ref 3.5–5.0)
Alkaline Phosphatase: 39 U/L (ref 38–126)
Anion gap: 9 (ref 5–15)
BUN: 10 mg/dL (ref 8–23)
CO2: 24 mmol/L (ref 22–32)
Calcium: 9.1 mg/dL (ref 8.9–10.3)
Chloride: 103 mmol/L (ref 98–111)
Creatinine, Ser: 0.83 mg/dL (ref 0.61–1.24)
GFR, Estimated: >60 mL/min (ref >60)
Glucose, Bld: 108 mg/dL — ABNORMAL HIGH (ref 70–99)
Potassium: 3.7 mmol/L (ref 3.5–5.1)
Sodium: 136 mmol/L (ref 135–145)
Total Bilirubin: 1.2 mg/dL (ref 0.3–1.2)
Total Protein: 6.4 g/dL — ABNORMAL LOW (ref 6.5–8.1)

## 2022-03-28 MED ORDER — SODIUM CHLORIDE 0.9 % IV SOLN
Freq: Once | INTRAVENOUS | Status: AC
  Filled 2022-03-28: qty 250

## 2022-03-28 MED ORDER — SODIUM CHLORIDE 0.9 % IV SOLN
900.0000 mg | Freq: Once | INTRAVENOUS | Status: AC
  Administered 2022-03-28: 900 mg via INTRAVENOUS
  Filled 2022-03-28: qty 45

## 2022-03-28 MED ORDER — FLUOROURACIL CHEMO INJECTION 2.5 GM/50ML
400.0000 mg/m2 | Freq: Once | INTRAVENOUS | Status: AC
  Administered 2022-03-28: 900 mg via INTRAVENOUS
  Filled 2022-03-28: qty 18

## 2022-03-28 MED ORDER — SODIUM CHLORIDE 0.9 % IV SOLN
5.0000 mg/kg | Freq: Once | INTRAVENOUS | Status: AC
  Administered 2022-03-28: 500 mg via INTRAVENOUS
  Filled 2022-03-28: qty 16

## 2022-03-28 MED ORDER — SODIUM CHLORIDE 0.9 % IV SOLN
10.0000 mg | Freq: Once | INTRAVENOUS | Status: AC
  Administered 2022-03-28: 10 mg via INTRAVENOUS
  Filled 2022-03-28: qty 10

## 2022-03-28 MED ORDER — INFLUENZA VAC A&B SA ADJ QUAD 0.5 ML IM PRSY
0.5000 mL | PREFILLED_SYRINGE | Freq: Once | INTRAMUSCULAR | Status: AC
  Administered 2022-03-28: 0.5 mL via INTRAMUSCULAR
  Filled 2022-03-28: qty 0.5

## 2022-03-28 MED ORDER — SODIUM CHLORIDE 0.9 % IV SOLN
2400.0000 mg/m2 | INTRAVENOUS | Status: DC
  Administered 2022-03-28: 5500 mg via INTRAVENOUS
  Filled 2022-03-28: qty 110

## 2022-03-28 NOTE — Assessment & Plan Note (Signed)
Chemotherapy plan as listed above 

## 2022-03-28 NOTE — Assessment & Plan Note (Signed)
Recommend influenza vaccination today, he agrees.

## 2022-03-28 NOTE — Progress Notes (Signed)
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   CEA is relatively stable with small fluctuations. Labs reviewed and discussed with patient Proceed with 5-FU/bevacizumab treatment today Continue current regimen. CT images were reviewed and findings were discussed. Stable disease.   Encounter for antineoplastic chemotherapy Chemotherapy plan as listed above.   Anemia due to chemotherapy Mild anemia. continue to monitor.  Hemoglobin is close to baseline.  BPH (benign prostatic hyperplasia) CT showed enlarged prostate, chronic outlet obstruction.  Patient denies any urinary hesitancy, urgency, frequency.  I recommend patient to discuss with his PCP.   Need for prophylactic vaccination and inoculation against influenza Recommend influenza vaccination today, he agrees.     Follow-up 2 weeks lab MD 5-FU/bevacizumab -   All questions were answered. The patient knows to call the clinic with any problems, questions or concerns.  [name], MD, PhD [facility] Hematology Oncology 03/28/2022   Chief Complaint: Jared Gutierrez is a 81 y.o. male presents for follow-up of metastatic colon cancer   PERTINENT ONCOLOGY HISTORY Jared Gutierrez is a 81 y.o.[place] who has above oncology history reviewed by me today presented for follow up visit for management of Metastatic colon cancer. [name], patient switched care to me on 11/11/20 Extensive medical record review was performed by me  Oncology History  History of colon cancer, stage I  10/02/2014 Pathology Results   [id number] colonoscopy pathology A. CECAL  POLYPS; HOT SNARE AND COLD BIOPSY:  - TUBULAR ADENOMA, MULTIPLE FRAGMENTS.  - NEGATIVE FOR HIGH-GRADE DYSPLASIA AND MALIGNANCY.   B. COLON POLYP, TRANSVERSE; COLD BIOPSY:  - TUBULAR ADENOMA.  - NEGATIVE FOR HIGH-GRADE DYSPLASIA AND MALIGNANCY.     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  Tumor extended into the submucosa. Margins were negative. + lymphovascular invasion. 14 lymph nodes were negative. Pathologic stage was T1 N0.  TRANSVERSE COLON, RESECTION:  - INVASIVE ADENOCARCINOMA ARISING IN A 4.6 CM TUBULOVILLOUS  ADENOMA WITH HIGH GRADE DYSPLASIA (MALIGNANT POLYP).  - SEE SUMMARY BELOW.  [name]  ONCOLOGY SUMMARY: COLON AND RECTUM, RESECTION, AJCC 7TH EDITION  Specimen: transverse colon  Procedure: transverse colon resection  Tumor site: splenic flexure  Tumor size: 1.0 cm (invasive component)  Macroscopic tumor perforation: not specified  Histologic type: invasive adenocarcinoma  Histologic grade: moderately differentiated  Microscopic tumor extension: into submucosa  Margins:  Proximal margin: negative       Distal margin: negative       Circumferential (radial) or mesenteric margin: negative       If all margins  uninvolved by invasive carcinoma:       Distance of invasive carcinoma from closest margin: 7.5 cm  to distal margin  Treatment effect: not applicable  Lymph-vascular invasion: present  Perineural invasion: not identified  Tumor deposits (discontinuous extramural extension): not  identified  Pathologic staging:       Primary tumor: pT1       Regional lymph nodes: pN0            Number of nodes examined: 14            Number of nodes involved: 0    04/02/2019 Progression   04/02/2019  PET scan  limited evealed a 2.4 x 2.3 cm (SUV 11) hypermetabolic soft tissue density caudal and anterior to the pancreatic neck favored to represent isolated peritoneal or nodal metastasis in the setting of prior transverse colonic resection (expected primary drainage). Although this was immediately adjacent to the pancreas, a fat plane was maintained, arguing strongly against a pancreatic primary. Otherwise, there was no evidence of hypermetabolic metastasis. 04/17/2019 EUS on  revealed a normal esophagus, stomach, duodenum, and pancreas.  There was a 2.4 x 2.4 cm irregular mass in the retroperitoneum adjacent to, but not involving the pancreatic neck.  FNA and core needle biopsy were performed.  Pathology revealed adenocarcinoma compatible with a metastatic lesion of colorectal origin.  Tumor cells were positive for CK20 and CDX2 and negative for CK7.  NGS: [employer] on 05/15/2019 revealed + KRASG13D and TP53.  Negative results included BRAF V600E, Her2, NRAS, NTRK, PD-L1 (<1%), and TMB 8.7/Mb (intermediate).  MMR testing from his colon resection on 10/16/2013 was intact with a low probability of MSI-H.   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  He received Neulasta after cycle #4 and #5 secondary to progressive leukopenia.  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  He received a truncated course of FOLFOX with cycle #11 secondary to oxaliplatin reaction.   01/14/2020 Imaging    PET revealed an interval decrease in size and FDG uptake (2.4 x 2.3 cm with SUV 11 to 2.4 x 1.7 cm with SUV 4.09) associated with the previously referenced soft tissue density caudal and anterior to the pancreatic neck suggesting treatment response. There were no new sites of FDG avid tumor.   08/31/2020 Imaging   08/31/2020 Abdomen and pelvis CT revealed increased size of the index soft tissue lesion inferior and anterior to the pancreatic neck (2.9 x 1.8 cm to 3.5 x 2.9 cm). There were similar prominent retroperitoneal lymph nodes without adenopathy by size criteria. There was no new or enlarging abdominal or pelvic lymph nodes. There were no new interval findings. There was similar circumferential wall thickening of a nondistended urinary bladder, which likely accentuated wall thickening There was hepatic steatosis and aortic atherosclerosis.   11/03/2020, PET showed recurrent peritoneal metastasis in the upper abdomen adjacent to the pancreas.  The lesion is 3.8 x 2.9 cm with SUV of 11.3. No evidence of metastatic peritoneal disease elsewhere in the abdomen pelvis.  No evidence of distant metastasis.   11/03/2020 Imaging   PET scan showed recurrent peritoneal metastasis near pancreas. Given that  he has no other distant metastasis. Discussed with radiation oncology. Repeat SBRT may be considered if he does not respond well to systemic chemotherapy.     11/26/2020 -  Chemotherapy   5-FU and bevacizumab.   02/17/2021 Imaging   02/17/2021 CT abdomen pelvis showed partial response. Mild decrease of peritoneal lesion.  Proceed with 5-FU and bevacizumab today.  Given that he is tolerating current regimen with good life quality, partial response.  Shared decision was made to hold off adding additional chemotherapy agents.   06/08/2021, CT chest abdomen pelvis with contrast  showed soft tissue mass at the base of mesentery inferior to the pancreatic neck is unchanged in size.  No progressive disease was identified.   09/07/2021, CT chest abdomen pelvis with contrast showed no substantial changes in size of the mesenteric mass, 2.6 cm.  No suspicious new lesions.  Chronic findings as detailed in the imaging report.   12/05/2021 Imaging   PET scan showed soft tissue mesenteric mass is unchanged in size.  Decreased FDG uptake compared to activity on November 03, 2020.  Fever treated disease.  No new metastatic disease.   03/21/2022 Imaging   CT chest abdomen pelvis  1. Similar size of the peripancreatic presumed peritoneal implant compared to 09/07/2021. 2. No new or progressive disease. 3. Incidental findings, including: Coronary artery atherosclerosis.Aortic Atherosclerosis (ICD10-I70.0). Prostatomegaly with chronic bladder wall thickening, suggesting outlet obstruction.        Other medical problems Chronic lower extremity edema. 12/24/2018, right lower extremity duplex negative for DVT.  Small right Baker's cyst. 08/16/2019, bilateral lower extremity duplex showed no DVT. 08/16/2019 - 08/17/2019 with right lower extremity cellulitis.  He was unable to bear weight.  He was treated with IV fluids, NSAIDs, colchicine, and broad antibiotics (vancomycin and Cefepime).  He was discharged on indomethacin x 5 days and Keflex 500 mg TID x 5 days.  10/05/2020, colonoscopy showed internal hemorrhoids.  Otherwise normal examination.  INTERVAL HISTORY TRESEAN MATTIX is a 81 y.o. male who has above history reviewed by me today presents for follow up visit for management of recurrent metastatic colon cancer Patient was accompanied by wife.   Denies fever, chills, nausea, vomiting, diarrhea, chest pain, shortness of breath, abdominal pain, urinary symptoms.  Denies constipation, he has regular BM  Review of Systems  Constitutional:  Negative for appetite change, chills,  diaphoresis, fever and unexpected weight change.  HENT:   Negative for hearing loss, nosebleeds, sore throat and tinnitus.   Respiratory:  Negative for cough, hemoptysis and shortness of breath.   Cardiovascular:  Negative for chest pain and palpitations.  Gastrointestinal:  Negative for abdominal pain, blood in stool, constipation, diarrhea, nausea and vomiting.  Genitourinary:  Negative for dysuria, frequency and hematuria.   Musculoskeletal:  Positive for arthralgias. Negative for back pain, myalgias and neck pain.  Skin:  Negative for itching and rash.  Neurological:  Positive for numbness. Negative for dizziness and headaches.  Hematological:  Does not bruise/bleed easily.  Psychiatric/Behavioral:  Negative for depression. The patient is not nervous/anxious.       Past Medical History:  Diagnosis Date   Arthritis    OSTEOARTHRITIS   Cancer ([place])    Cavitary lesion of lung    RIGHT LOWER LOBE   Chicken pox    Colon cancer ([facility])    History of kidney stones    Hypertension    Lipoma of colon    Nephrolithiasis    Nephrolithiasis    Obesity  Shingles    Tubular adenoma of colon    multiple fragments    Past Surgical History:  Procedure Laterality Date   COLON SURGERY     COLONOSCOPY N/A 10/02/2014   Procedure: COLONOSCOPY;  Surgeon: Josefine Class, MD;  Location[facility] ENDOSCOPY;  Service: Endoscopy;  Laterality: N/A;   COLONOSCOPY WITH PROPOFOL N/A 02/16/2017   Procedure: COLONOSCOPY WITH PROPOFOL;  Surgeon: Manya Silvas, MD;  Location[facility] ENDOSCOPY;  Service: Endoscopy;  Laterality: N/A;   COLONOSCOPY WITH PROPOFOL N/A 10/05/2020   Procedure: COLONOSCOPY WITH PROPOFOL;  Surgeon: Lesly Rubenstein, MD;  Location: [facility] ENDOSCOPY;  Service: Endoscopy;  Laterality: N/A;   EUS N/A 04/17/2019   Procedure: FULL UPPER ENDOSCOPIC ULTRASOUND (EUS) RADIAL;  Surgeon: Holly Bodily, MD;  Location: [facility] ENDOSCOPY;  Service: Gastroenterology;  Laterality: N/A;    KIDNEY STONE SURGERY     PARTIAL COLECTOMY  10/17/2013   PORTACATH PLACEMENT Right 06/13/2019   Procedure: INSERTION PORT-A-CATH;  Surgeon: Benjamine Sprague, DO;  Location: [facility] ORS;  Service: General;  Laterality: Right;    Family History  Problem Relation Age of Onset   Cancer Mother    Breast cancer Mother    COPD Father     Social History:  reports that he has never smoked. He has never used smokeless tobacco. He reports current alcohol use. He reports that he does not use drugs.    Allergies: No Known Allergies  Current Medications: Current Outpatient Medications  Medication Sig Dispense Refill   acetaminophen (TYLENOL) 500 MG tablet Take 500 mg by mouth every 6 (six) hours as needed.     amLODipine (NORVASC) 2.5 MG tablet Take 2.5 mg by mouth daily.     aspirin EC 81 MG tablet Take 81 mg by mouth daily.     Calcium Carbonate (CALCIUM 600 PO) Take 1 tablet by mouth 2 (two) times daily.     Cholecalciferol (VITAMIN D) 125 MCG (5000 UT) CAPS Take 5,000 mg by mouth.     docusate sodium (COLACE) 100 MG capsule Take 1 capsule (100 mg total) by mouth 2 (two) times daily as needed for mild constipation or moderate constipation. 60 capsule 3   HYDROcodone-acetaminophen (NORCO/VICODIN) 5-325 MG tablet Take 1 tablet by mouth every 6 (six) hours as needed for moderate pain (up to 3 doses for moderate pain.).     KLOR-CON M20 20 MEQ tablet TAKE 1 TABLET BY MOUTH TWICE A DAY 180 tablet 1   lidocaine-prilocaine (EMLA) cream APPLY TO AFFECTED AREA ONCE AS DIRECTED 30 g 3   loperamide (IMODIUM) 2 MG capsule Take 1 capsule (2 mg total) by mouth See admin instructions. Take 2 tablets after first loose stool,  then 1 tablet  after each loose stool; maximum: 8 tablets /day 60 capsule 0   loratadine (CLARITIN) 10 MG tablet Take 10 mg by mouth daily.     olmesartan (BENICAR) 20 MG tablet Take 1 tablet by mouth daily.     ondansetron (ZOFRAN) 8 MG tablet Take 1 tablet (8 mg total) by mouth 2 (two) times  daily as needed for refractory nausea / vomiting. Start on day 3 after chemotherapy. 60 tablet 1   Probiotic Product (PROBIOTIC DAILY PO) Take 1 tablet by mouth daily.     prochlorperazine (COMPAZINE) 10 MG tablet Take 1 tablet (10 mg total) by mouth every 6 (six) hours as needed (NAUSEA). 30 tablet 1   No current facility-administered medications for this visit.   Facility-Administered Medications Ordered in Other  Visits  Medication Dose Route Frequency Provider Last Rate Last Admin   0.9 %  sodium chloride infusion   Intravenous Once Corcoran, Melissa C, MD       0.9 %  sodium chloride infusion   Intravenous Continuous Lequita Asal, MD 10 mL/hr at 12/31/19 1000 New Bag at 10/05/20 1045   bevacizumab-awwb (MVASI) 500 mg in sodium chloride 0.9 % 100 mL chemo infusion  5 mg/kg (Treatment Plan Recorded) Intravenous Once [name], MD       fluorouracil (ADRUCIL) 5,500 mg in sodium chloride 0.9 % 140 mL chemo infusion  2,400 mg/m2 (Treatment Plan Recorded) Intravenous 1 day or 1 dose [name], MD       fluorouracil (ADRUCIL) chemo injection 900 mg  400 mg/m2 (Treatment Plan Recorded) Intravenous Once [name], MD       heparin lock flush 100 unit/mL  500 Units Intravenous Once Lequita Asal, MD       leucovorin 900 mg in sodium chloride 0.9 % 250 mL infusion  900 mg Intravenous Once [name], MD        Performance status (ECOG): 1  Vitals Blood pressure 116/69, pulse 70, temperature 98.5 F (36.9 C), temperature source Tympanic, weight 242 lb 3.2 oz (109.9 kg), SpO2 100 %.   Physical Exam Vitals and nursing note reviewed.  Constitutional:      General: He is not in acute distress.    Appearance: He is well-developed. He is obese. He is not diaphoretic.     Interventions: Face mask in place.  HENT:     Head: Normocephalic and atraumatic.  Eyes:     General: No scleral icterus.    Extraocular Movements: Extraocular movements intact.     Conjunctiva/sclera: Conjunctivae  normal.     Pupils: Pupils are equal, round, and reactive to light.  Cardiovascular:     Rate and Rhythm: Normal rate and regular rhythm.     Heart sounds: Normal heart sounds. No murmur heard. Pulmonary:     Effort: Pulmonary effort is normal. No respiratory distress.     Breath sounds: No wheezing.     Comments: Decreased breath sound bilaterally Abdominal:     General: There is no distension.     Palpations: Abdomen is soft. There is no mass.     Tenderness: There is no abdominal tenderness.  Musculoskeletal:        General: No swelling or tenderness. Normal range of motion.     Cervical back: Normal range of motion and neck supple.     Comments: Trace edema bilaterally.   Lymphadenopathy:     Head:     Right side of head: No preauricular, posterior auricular or occipital adenopathy.     Left side of head: No preauricular, posterior auricular or occipital adenopathy.     Cervical: No cervical adenopathy.     Upper Body:     Right upper body: No supraclavicular or axillary adenopathy.     Left upper body: No supraclavicular or axillary adenopathy.     Lower Body: No right inguinal adenopathy. No left inguinal adenopathy.  Skin:    General: Skin is warm and dry.  Neurological:     Mental Status: He is alert and oriented to person, place, and time. Mental status is at baseline.  Psychiatric:        Mood and Affect: Mood normal.    Labs were reviewed by me.     Latest Ref Rng & Units 03/28/2022  8:04 AM 03/14/2022    8:02 AM 02/28/2022    8:16 AM  CBC  WBC 4.0 - 10.5 K/uL 4.5  4.9  4.7   Hemoglobin 13.0 - 17.0 g/dL 12.1  11.8  11.9   Hematocrit 39.0 - 52.0 % 36.9  36.8  35.8   Platelets 150 - 400 K/uL 166  178  157       Latest Ref Rng & Units 03/28/2022    8:04 AM 03/14/2022    8:02 AM 02/28/2022    8:16 AM  CMP  Glucose 70 - 99 mg/dL 108  91  128   BUN 8 - 23 mg/dL _0 Creatinine 0.61 - 1.24 mg/dL 0.83  0.69  0.88   Sodium 135 - 145 mmol/L 136  134   137   Potassium 3.5 - 5.1 mmol/L 3.7  3.9  3.7   Chloride 98 - 111 mmol/L 103  104  105   CO2 22 - 32 mmol/L _1 Calcium 8.9 - 10.3 mg/dL 9.1  9.0  9.3   Total Protein 6.5 - 8.1 g/dL 6.4  6.6  6.5   Total Bilirubin 0.3 - 1.2 mg/dL 1.2  1.1  1.4   Alkaline Phos 38 - 126 U/L 39  39  39   AST 15 - 41 U/L 36  33  34   ALT 0 - 44 U/L 17  17  19

## 2022-03-28 NOTE — Assessment & Plan Note (Signed)
CT showed enlarged prostate, chronic outlet obstruction.  Patient denies any urinary hesitancy, urgency, frequency.  I recommend patient to discuss with his PCP.

## 2022-03-28 NOTE — Progress Notes (Signed)
Patient is here for follow-up and 5-FU/Bevacizumab treatment. Patient had a painful episode this weekend with his foot. His wife states that she believes that it was because of what he ate this weekend. Otherwise patient has no complaints today.

## 2022-03-28 NOTE — Assessment & Plan Note (Signed)
Mild anemia. continue to monitor.  Hemoglobin is close to baseline. 

## 2022-03-28 NOTE — Patient Instructions (Signed)
[facility] CANCER CTR AT [place]  Discharge Instructions: Thank you for choosing [facility] to provide your oncology and hematology care.  If you have a lab appointment with the [facility], please go directly to the [facility] and check in at the registration area.  Wear comfortable clothing and clothing appropriate for easy access to any Portacath or PICC line.   We strive to give you quality time with your provider. You may need to reschedule your appointment if you arrive late (15 or more minutes).  Arriving late affects you and other patients whose appointments are after yours.  Also, if you miss three or more appointments without notifying the office, you may be dismissed from the clinic at the provider's discretion.      For prescription refill requests, have your pharmacy contact our office and allow 72 hours for refills to be completed.    Today you received the following chemotherapy and/or immunotherapy agents MVASI, Leucovorin Adrucil      To help prevent nausea and vomiting after your treatment, we encourage you to take your nausea medication as directed.  BELOW ARE SYMPTOMS THAT SHOULD BE REPORTED IMMEDIATELY: *FEVER GREATER THAN 100.4 F (38 C) OR HIGHER *CHILLS OR SWEATING *NAUSEA AND VOMITING THAT IS NOT CONTROLLED WITH YOUR NAUSEA MEDICATION *UNUSUAL SHORTNESS OF BREATH *UNUSUAL BRUISING OR BLEEDING *URINARY PROBLEMS (pain or burning when urinating, or frequent urination) *BOWEL PROBLEMS (unusual diarrhea, constipation, pain near the anus) TENDERNESS IN MOUTH AND THROAT WITH OR WITHOUT PRESENCE OF ULCERS (sore throat, sores in mouth, or a toothache) UNUSUAL RASH, SWELLING OR PAIN  UNUSUAL VAGINAL DISCHARGE OR ITCHING   Items with * indicate a potential emergency and should be followed up as soon as possible or go to the Emergency Department if any problems should occur.  Please show the CHEMOTHERAPY ALERT CARD or IMMUNOTHERAPY ALERT CARD  at check-in to the Emergency Department and triage nurse.  Should you have questions after your visit or need to cancel or reschedule your appointment, please contact [facility] CANCER [place] AT [place]  [phone] and follow the prompts.  Office hours are 8:00 a.m. to 4:30 p.m. Monday - Friday. Please note that voicemails left after 4:00 p.m. may not be returned until the following business day.  We are closed weekends and major holidays. You have access to a nurse at all times for urgent questions. Please call the main number to the clinic [phone] and follow the prompts.  For any non-urgent questions, you may also contact your provider using MyChart. We now offer e-Visits for anyone 81 and older to request care online for non-urgent symptoms. For details visit [email or web address].   Also download the MyChart app! Go to the app store, search "MyChart", open the app, select [facility], and log in with your MyChart username and password.  Masks are optional in the cancer centers. If you would like for your care team to wear a mask while they are taking care of you, please let them know. For doctor visits, patients may have with them one support person who is at least 81 years old. At this time, visitors are not allowed in the infusion area.

## 2022-03-28 NOTE — Assessment & Plan Note (Addendum)
Overall he tolerates chemotherapy.   CEA is relatively stable with small fluctuations. Labs reviewed and discussed with patient Proceed with 5-FU/bevacizumab treatment today Continue current regimen. CT images were reviewed and findings were discussed. Stable disease.

## 2022-03-29 ENCOUNTER — Other Ambulatory Visit: Payer: Self-pay

## 2022-03-29 LAB — CEA: CEA: 12.6 ng/mL — ABNORMAL HIGH (ref 0.0–4.7)

## 2022-03-30 ENCOUNTER — Inpatient Hospital Stay: Payer: Medicare Other

## 2022-03-30 VITALS — BP 159/79 | HR 60 | Resp 18

## 2022-03-30 DIAGNOSIS — Z85038 Personal history of other malignant neoplasm of large intestine: Secondary | ICD-10-CM

## 2022-03-30 DIAGNOSIS — C786 Secondary malignant neoplasm of retroperitoneum and peritoneum: Secondary | ICD-10-CM

## 2022-03-30 MED ORDER — HEPARIN SOD (PORK) LOCK FLUSH 100 UNIT/ML IV SOLN
500.0000 [IU] | Freq: Once | INTRAVENOUS | Status: DC | PRN
  Filled 2022-03-30: qty 5

## 2022-03-30 MED ORDER — SODIUM CHLORIDE 0.9% FLUSH
10.0000 mL | INTRAVENOUS | Status: DC | PRN
  Filled 2022-03-30: qty 10

## 2022-04-10 MED FILL — Dexamethasone Sodium Phosphate Inj 100 MG/10ML: INTRAMUSCULAR | Qty: 1 | Status: AC

## 2022-04-11 ENCOUNTER — Encounter: Payer: Self-pay | Admitting: Oncology

## 2022-04-11 ENCOUNTER — Inpatient Hospital Stay: Payer: Medicare Other

## 2022-04-11 ENCOUNTER — Other Ambulatory Visit: Payer: Self-pay

## 2022-04-11 ENCOUNTER — Inpatient Hospital Stay (HOSPITAL_BASED_OUTPATIENT_CLINIC_OR_DEPARTMENT_OTHER): Payer: Medicare Other | Admitting: Oncology

## 2022-04-11 VITALS — BP 116/94 | HR 66 | Temp 98.1°F | Resp 18 | Wt 242.7 lb

## 2022-04-11 DIAGNOSIS — D6481 Anemia due to antineoplastic chemotherapy: Secondary | ICD-10-CM | POA: Diagnosis not present

## 2022-04-11 DIAGNOSIS — C786 Secondary malignant neoplasm of retroperitoneum and peritoneum: Secondary | ICD-10-CM | POA: Diagnosis not present

## 2022-04-11 DIAGNOSIS — Z5111 Encounter for antineoplastic chemotherapy: Secondary | ICD-10-CM

## 2022-04-11 DIAGNOSIS — C184 Malignant neoplasm of transverse colon: Secondary | ICD-10-CM

## 2022-04-11 DIAGNOSIS — Z85038 Personal history of other malignant neoplasm of large intestine: Secondary | ICD-10-CM

## 2022-04-11 DIAGNOSIS — G620 Drug-induced polyneuropathy: Secondary | ICD-10-CM | POA: Diagnosis not present

## 2022-04-11 DIAGNOSIS — Z5112 Encounter for antineoplastic immunotherapy: Secondary | ICD-10-CM | POA: Diagnosis not present

## 2022-04-11 DIAGNOSIS — T451X5D Adverse effect of antineoplastic and immunosuppressive drugs, subsequent encounter: Secondary | ICD-10-CM

## 2022-04-11 LAB — CBC WITH DIFFERENTIAL/PLATELET
Abs Immature Granulocytes: 0.01 10*3/uL (ref 0.00–0.07)
Basophils Absolute: 0 10*3/uL (ref 0.0–0.1)
Basophils Relative: 1 %
Eosinophils Absolute: 0.1 10*3/uL (ref 0.0–0.5)
Eosinophils Relative: 1 %
HCT: 35.5 % — ABNORMAL LOW (ref 39.0–52.0)
Hemoglobin: 11.8 g/dL — ABNORMAL LOW (ref 13.0–17.0)
Immature Granulocytes: 0 %
Lymphocytes Relative: 36 %
Lymphs Abs: 1.6 10*3/uL (ref 0.7–4.0)
MCH: 30.6 pg (ref 26.0–34.0)
MCHC: 33.2 g/dL (ref 30.0–36.0)
MCV: 92 fL (ref 80.0–100.0)
Monocytes Absolute: 0.4 10*3/uL (ref 0.1–1.0)
Monocytes Relative: 9 %
Neutro Abs: 2.3 10*3/uL (ref 1.7–7.7)
Neutrophils Relative %: 53 %
Platelets: 161 10*3/uL (ref 150–400)
RBC: 3.86 MIL/uL — ABNORMAL LOW (ref 4.22–5.81)
RDW: 15.2 % (ref 11.5–15.5)
WBC: 4.5 10*3/uL (ref 4.0–10.5)
nRBC: 0 % (ref 0.0–0.2)

## 2022-04-11 LAB — COMPREHENSIVE METABOLIC PANEL
ALT: 21 U/L (ref 0–44)
AST: 38 U/L (ref 15–41)
Albumin: 3.5 g/dL (ref 3.5–5.0)
Alkaline Phosphatase: 35 U/L — ABNORMAL LOW (ref 38–126)
Anion gap: 5 (ref 5–15)
BUN: 13 mg/dL (ref 8–23)
CO2: 22 mmol/L (ref 22–32)
Calcium: 9.4 mg/dL (ref 8.9–10.3)
Chloride: 108 mmol/L (ref 98–111)
Creatinine, Ser: 0.66 mg/dL (ref 0.61–1.24)
GFR, Estimated: >60 mL/min (ref >60)
Glucose, Bld: 98 mg/dL (ref 70–99)
Potassium: 4.1 mmol/L (ref 3.5–5.1)
Sodium: 135 mmol/L (ref 135–145)
Total Bilirubin: 0.8 mg/dL (ref 0.3–1.2)
Total Protein: 6.6 g/dL (ref 6.5–8.1)

## 2022-04-11 LAB — PROTEIN, URINE, RANDOM: Total Protein, Urine: 18 mg/dL

## 2022-04-11 MED ORDER — SODIUM CHLORIDE 0.9 % IV SOLN
2400.0000 mg/m2 | INTRAVENOUS | Status: DC
  Administered 2022-04-11: 5500 mg via INTRAVENOUS
  Filled 2022-04-11: qty 110

## 2022-04-11 MED ORDER — SODIUM CHLORIDE 0.9 % IV SOLN
900.0000 mg | Freq: Once | INTRAVENOUS | Status: AC
  Administered 2022-04-11: 900 mg via INTRAVENOUS
  Filled 2022-04-11: qty 45

## 2022-04-11 MED ORDER — SODIUM CHLORIDE 0.9 % IV SOLN
10.0000 mg | Freq: Once | INTRAVENOUS | Status: AC
  Administered 2022-04-11: 10 mg via INTRAVENOUS
  Filled 2022-04-11: qty 10

## 2022-04-11 MED ORDER — SODIUM CHLORIDE 0.9 % IV SOLN
5.0000 mg/kg | Freq: Once | INTRAVENOUS | Status: AC
  Administered 2022-04-11: 500 mg via INTRAVENOUS
  Filled 2022-04-11: qty 16

## 2022-04-11 MED ORDER — SODIUM CHLORIDE 0.9 % IV SOLN
Freq: Once | INTRAVENOUS | Status: AC
  Filled 2022-04-11: qty 250

## 2022-04-11 MED ORDER — FLUOROURACIL CHEMO INJECTION 2.5 GM/50ML
400.0000 mg/m2 | Freq: Once | INTRAVENOUS | Status: AC
  Administered 2022-04-11: 900 mg via INTRAVENOUS
  Filled 2022-04-11: qty 18

## 2022-04-11 NOTE — Patient Instructions (Signed)
[facility]  Discharge Instructions: Thank you for choosing [facility] to provide your oncology and hematology care.  If you have a lab appointment with the Cancer Center, please go directly to the Cancer Center and check in at the registration area.  Wear comfortable clothing and clothing appropriate for easy access to any Portacath or PICC line.   We strive to give you quality time with your provider. You may need to reschedule your appointment if you arrive late (15 or more minutes).  Arriving late affects you and other patients whose appointments are after yours.  Also, if you miss three or more appointments without notifying the office, you may be dismissed from the clinic at the provider's discretion.      For prescription refill requests, have your pharmacy contact our office and allow 72 hours for refills to be completed.       To help prevent nausea and vomiting after your treatment, we encourage you to take your nausea medication as directed.  BELOW ARE SYMPTOMS THAT SHOULD BE REPORTED IMMEDIATELY: *FEVER GREATER THAN 100.4 F (38 C) OR HIGHER *CHILLS OR SWEATING *NAUSEA AND VOMITING THAT IS NOT CONTROLLED WITH YOUR NAUSEA MEDICATION *UNUSUAL SHORTNESS OF BREATH *UNUSUAL BRUISING OR BLEEDING *URINARY PROBLEMS (pain or burning when urinating, or frequent urination) *BOWEL PROBLEMS (unusual diarrhea, constipation, pain near the anus) TENDERNESS IN MOUTH AND THROAT WITH OR WITHOUT PRESENCE OF ULCERS (sore throat, sores in mouth, or a toothache) UNUSUAL RASH, SWELLING OR PAIN  UNUSUAL VAGINAL DISCHARGE OR ITCHING   Items with * indicate a potential emergency and should be followed up as soon as possible or go to the Emergency Department if any problems should occur.  Please show the CHEMOTHERAPY ALERT CARD or IMMUNOTHERAPY ALERT CARD at check-in to the Emergency Department and triage nurse.  Should you have questions after your  visit or need to cancel or reschedule your appointment, please contact [facility]  [phone] and follow the prompts.  Office hours are 8:00 a.m. to 4:30 p.m. Monday - Friday. Please note that voicemails left after 4:00 p.m. may not be returned until the following business day.  We are closed weekends and major holidays. You have access to a nurse at all times for urgent questions. Please call the main number to the clinic [phone] and follow the prompts.  For any non-urgent questions, you may also contact your provider using MyChart. We now offer e-Visits for anyone 18 and older to request care online for non-urgent symptoms. For details visit [email or web address].   Also download the MyChart app! Go to the app store, search "MyChart", open the app, select [facility], and log in with your MyChart username and password.  Masks are optional in the cancer centers. If you would like for your care team to wear a mask while they are taking care of you, please let them know. For doctor visits, patients may have with them one support person who is at least 81 years old. At this time, visitors are not allowed in the infusion area.   

## 2022-04-11 NOTE — Assessment & Plan Note (Signed)
Overall he tolerates chemotherapy.   CEA is relatively stable with small fluctuations. CT images were reviewed and findings were discussed. Stable disease.  Labs reviewed and discussed with patient Proceed with 5-FU/bevacizumab treatment today Continue current regimen.

## 2022-04-11 NOTE — Progress Notes (Signed)
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   CEA is relatively stable with small fluctuations. CT images were reviewed and findings were discussed. Stable disease.  Labs reviewed and discussed with patient Proceed with 5-FU/bevacizumab treatment today Continue current regimen.   Encounter for antineoplastic chemotherapy Chemotherapy plan as listed above.   Chemotherapy-induced neuropathy ([place]) Patient has gabapentin supply at home.Patient declines gabapentin and acupuncture.   Anemia due to chemotherapy Mild anemia. continue to monitor.  Hemoglobin is close to baseline.   Follow-up 2 weeks lab MD 5-FU/bevacizumab -   All questions were answered. The patient knows to call the clinic with any problems, questions or concerns.  Jared Server, MD, PhD [facility] Oncology 04/11/2022   Chief Complaint: Jared Gutierrez is a 81 y.o. male presents for follow-up of metastatic colon cancer   PERTINENT ONCOLOGY HISTORY Jared Gutierrez is a 81 y.o.[place] who has above oncology history reviewed by me today presented for follow up visit for management of Metastatic colon cancer. Patient previously followed up by Jared Gutierrez, patient switched care to me on 11/11/20 Extensive medical record review was performed by me  Oncology History  History of colon cancer, stage I  10/02/2014 Pathology Results   [id number] colonoscopy pathology A. CECAL POLYPS; HOT SNARE AND COLD BIOPSY:  - TUBULAR ADENOMA, MULTIPLE FRAGMENTS.  - NEGATIVE FOR HIGH-GRADE DYSPLASIA AND MALIGNANCY.   B. COLON POLYP, TRANSVERSE; COLD BIOPSY:  - TUBULAR ADENOMA.  -  NEGATIVE FOR HIGH-GRADE DYSPLASIA AND MALIGNANCY.     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  Tumor extended into the submucosa. Margins were negative. + lymphovascular invasion. 14 lymph nodes were negative. Pathologic stage was T1 N0.  TRANSVERSE COLON, RESECTION:  - INVASIVE ADENOCARCINOMA ARISING IN A 4.6 CM TUBULOVILLOUS  ADENOMA WITH HIGH GRADE DYSPLASIA (MALIGNANT POLYP).  - SEE SUMMARY BELOW.  [name]  ONCOLOGY SUMMARY: COLON AND RECTUM, RESECTION, AJCC 7TH EDITION  Specimen: transverse colon  Procedure: transverse colon resection  Tumor site: splenic flexure  Tumor size: 1.0 cm (invasive component)  Macroscopic tumor perforation: not specified  Histologic type: invasive adenocarcinoma  Histologic grade: moderately differentiated  Microscopic tumor extension: into submucosa  Margins:       Proximal margin: negative       Distal margin: negative       Circumferential (radial) or mesenteric margin: negative  If all margins uninvolved by invasive carcinoma:       Distance of invasive carcinoma from closest margin: 7.5 cm  to distal margin  Treatment effect: not applicable  Lymph-vascular invasion: present  Perineural  invasion: not identified  Tumor deposits (discontinuous extramural extension): not  identified  Pathologic staging:       Primary tumor: pT1       Regional lymph nodes: pN0            Number of nodes examined: 14            Number of nodes involved: 0    04/02/2019 Progression   04/02/2019  PET scan  limited evealed a 2.4 x 2.3 cm (SUV 11) hypermetabolic soft tissue density caudal and anterior to the pancreatic neck favored to represent isolated peritoneal or nodal metastasis in the setting of prior transverse colonic resection (expected primary drainage). Although this was immediately adjacent to the pancreas, a fat plane was maintained, arguing strongly against a pancreatic primary. Otherwise, there was no evidence of hypermetabolic metastasis. 04/17/2019 EUS on  revealed a normal esophagus, stomach, duodenum, and pancreas.  There was a 2.4 x 2.4 cm irregular mass in the retroperitoneum adjacent to, but not involving the pancreatic neck.  FNA and core needle biopsy were performed.  Pathology revealed adenocarcinoma compatible with a metastatic lesion of colorectal origin.  Tumor cells were positive for CK20 and CDX2 and negative for CK7.  NGS: [employer] on 05/15/2019 revealed + KRASG13D and TP53.  Negative results included BRAF V600E, Her2, NRAS, NTRK, PD-L1 (<1%), and TMB 8.7/Mb (intermediate).  MMR testing from his colon resection on 10/16/2013 was intact with a low probability of MSI-H.   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  He received Neulasta after cycle #4 and #5 secondary to progressive leukopenia.  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  He received a truncated course of FOLFOX with cycle #11 secondary to oxaliplatin reaction.   01/14/2020 Imaging    PET revealed an interval decrease in size and FDG uptake (2.4 x 2.3 cm  with SUV 11 to 2.4 x 1.7 cm with SUV 4.09) associated with the previously referenced soft tissue density caudal and anterior to the pancreatic neck suggesting treatment response. There were no new sites of FDG avid tumor.   08/31/2020 Imaging   08/31/2020 Abdomen and pelvis CT revealed increased size of the index soft tissue lesion inferior and anterior to the pancreatic neck (2.9 x 1.8 cm to 3.5 x 2.9 cm). There were similar prominent retroperitoneal lymph nodes without adenopathy by size criteria. There was no new or enlarging abdominal or pelvic lymph nodes. There were no new interval findings. There was similar circumferential wall thickening of a nondistended urinary bladder, which likely accentuated wall thickening There was hepatic steatosis and aortic atherosclerosis.   11/03/2020, PET showed recurrent peritoneal metastasis in the upper abdomen adjacent to the pancreas.  The lesion is 3.8 x 2.9 cm with SUV of 11.3. No evidence of metastatic peritoneal disease elsewhere in the abdomen pelvis.  No evidence of distant metastasis.   11/03/2020 Imaging   PET scan showed recurrent peritoneal metastasis near pancreas. Given that he has no other distant metastasis. Discussed with radiation oncology. Repeat SBRT may be considered if he does not respond well to systemic chemotherapy.     11/26/2020 -  Chemotherapy   5-FU and bevacizumab.   02/17/2021 Imaging   02/17/2021 CT abdomen pelvis showed partial response. Mild decrease of peritoneal lesion.  Proceed with 5-FU and bevacizumab today.  Given that he is tolerating current regimen with good life quality, partial response.  Shared decision was made to hold off adding additional chemotherapy agents.   06/08/2021, CT chest abdomen pelvis with contrast showed soft tissue mass at the base of mesentery inferior to the pancreatic neck is unchanged in size.  No progressive disease was identified.   09/07/2021, CT chest abdomen pelvis with contrast showed no  substantial changes in size of the mesenteric mass, 2.6 cm.  No suspicious new lesions.  Chronic findings as detailed in the imaging report.   12/05/2021 Imaging   PET scan showed soft tissue mesenteric mass is unchanged in size.  Decreased FDG uptake compared to activity on November 03, 2020.  Fever treated disease.  No new metastatic disease.   03/21/2022 Imaging   CT chest abdomen pelvis  1. Similar size of the peripancreatic presumed peritoneal implant compared to 09/07/2021. 2. No new or progressive disease. 3. Incidental findings, including: Coronary artery atherosclerosis.Aortic Atherosclerosis (ICD10-I70.0). Prostatomegaly with chronic bladder wall thickening, suggesting outlet obstruction.        Other medical problems Chronic lower extremity edema. 12/24/2018, right lower extremity duplex negative for DVT.  Small right Baker's cyst. 08/16/2019, bilateral lower extremity duplex showed no DVT. 08/16/2019 - 08/17/2019 with right lower extremity cellulitis.  He was unable to bear weight.  He was treated with IV fluids, NSAIDs, colchicine, and broad antibiotics (vancomycin and Cefepime).  He was discharged on indomethacin x 5 days and Keflex 500 mg TID x 5 days.  10/05/2020, colonoscopy showed internal hemorrhoids.  Otherwise normal examination.  INTERVAL HISTORY Jared Gutierrez is a 81 y.o. male who has above history reviewed by me today presents for follow up visit for management of recurrent metastatic colon cancer Patient was accompanied by wife.   Denies fever, chills, nausea, vomiting, diarrhea, chest pain, shortness of breath, abdominal pain, urinary symptoms.  Patient has no new complaints today.  Review of Systems  Constitutional:  Negative for appetite change, chills, diaphoresis, fever and unexpected weight change.  HENT:   Negative for hearing loss, nosebleeds, sore throat and tinnitus.   Respiratory:  Negative for cough, hemoptysis and shortness of breath.   Cardiovascular:   Negative for chest pain and palpitations.  Gastrointestinal:  Negative for abdominal pain, blood in stool, constipation, diarrhea, nausea and vomiting.  Genitourinary:  Negative for dysuria, frequency and hematuria.   Musculoskeletal:  Positive for arthralgias. Negative for back pain, myalgias and neck pain.  Skin:  Negative for itching and rash.  Neurological:  Positive for numbness. Negative for dizziness and headaches.  Hematological:  Does not bruise/bleed easily.  Psychiatric/Behavioral:  Negative for depression. The patient is not nervous/anxious.       Past Medical History:  Diagnosis Date   Arthritis    OSTEOARTHRITIS   Cancer ([place])    Cavitary lesion of lung    RIGHT LOWER LOBE   Chicken pox    Colon cancer ([place])    History of kidney stones    Hypertension    Lipoma of colon    Nephrolithiasis    Nephrolithiasis    Obesity    Shingles    Tubular adenoma of colon    multiple fragments    Past Surgical History:  Procedure Laterality Date   COLON SURGERY  COLONOSCOPY N/A 10/02/2014   Procedure: COLONOSCOPY;  Surgeon: Josefine Class, MD;  Location[facility]COPY;  Service: Endoscopy;  Laterality: N/A;   COLONOSCOPY WITH PROPOFOL N/A 02/16/2017   Procedure: COLONOSCOPY WITH PROPOFOL;  Surgeon: Manya Silvas, MD;  Location: [facility] ENDOSCOPY;  Service: Endoscopy;  Laterality: N/A;   COLONOSCOPY WITH PROPOFOL N/A 10/05/2020   Procedure: COLONOSCOPY WITH PROPOFOL;  Surgeon: Lesly Rubenstein, MD;  Location: [facility] ENDOSCOPY;  Service: Endoscopy;  Laterality: N/A;   EUS N/A 04/17/2019   Procedure: FULL UPPER ENDOSCOPIC ULTRASOUND (EUS) RADIAL;  Surgeon: Holly Bodily, MD;  Location[facility] ENDOSCOPY;  Service: Gastroenterology;  Laterality: N/A;   KIDNEY STONE SURGERY     PARTIAL COLECTOMY  10/17/2013   PORTACATH PLACEMENT Right 06/13/2019   Procedure: INSERTION PORT-A-CATH;  Surgeon: Benjamine Sprague, DO;  Location: [facility] ORS;  Service: General;  Laterality: Right;     Family History  Problem Relation Age of Onset   Cancer Mother    Breast cancer Mother    COPD Father     Social History:  reports that he has never smoked. He has never used smokeless tobacco. He reports current alcohol use. He reports that he does not use drugs.    Allergies: No Known Allergies  Current Medications: Current Outpatient Medications  Medication Sig Dispense Refill   acetaminophen (TYLENOL) 500 MG tablet Take 500 mg by mouth every 6 (six) hours as needed.     amLODipine (NORVASC) 2.5 MG tablet Take 2.5 mg by mouth daily.     aspirin EC 81 MG tablet Take 81 mg by mouth daily.     Calcium Carbonate (CALCIUM 600 PO) Take 1 tablet by mouth 2 (two) times daily.     Cholecalciferol (VITAMIN D) 125 MCG (5000 UT) CAPS Take 5,000 mg by mouth.     docusate sodium (COLACE) 100 MG capsule Take 1 capsule (100 mg total) by mouth 2 (two) times daily as needed for mild constipation or moderate constipation. 60 capsule 3   HYDROcodone-acetaminophen (NORCO/VICODIN) 5-325 MG tablet Take 1 tablet by mouth every 6 (six) hours as needed for moderate pain (up to 3 doses for moderate pain.).     KLOR-CON M20 20 MEQ tablet TAKE 1 TABLET BY MOUTH TWICE A DAY 180 tablet 1   lidocaine-prilocaine (EMLA) cream APPLY TO AFFECTED AREA ONCE AS DIRECTED 30 g 3   loperamide (IMODIUM) 2 MG capsule Take 1 capsule (2 mg total) by mouth See admin instructions. Take 2 tablets after first loose stool,  then 1 tablet  after each loose stool; maximum: 8 tablets /day 60 capsule 0   loratadine (CLARITIN) 10 MG tablet Take 10 mg by mouth daily.     olmesartan (BENICAR) 20 MG tablet Take 1 tablet by mouth daily.     ondansetron (ZOFRAN) 8 MG tablet Take 1 tablet (8 mg total) by mouth 2 (two) times daily as needed for refractory nausea / vomiting. Start on day 3 after chemotherapy. 60 tablet 1   Probiotic Product (PROBIOTIC DAILY PO) Take 1 tablet by mouth daily.     prochlorperazine (COMPAZINE) 10 MG tablet Take 1  tablet (10 mg total) by mouth every 6 (six) hours as needed (NAUSEA). 30 tablet 1   No current facility-administered medications for this visit.   Facility-Administered Medications Ordered in Other Visits  Medication Dose Route Frequency Provider Last Rate Last Admin   0.9 %  sodium chloride infusion   Intravenous Once Lequita Asal, MD  0.9 %  sodium chloride infusion   Intravenous Continuous Nolon Stalls C, MD 10 mL/hr at 12/31/19 1000 New Bag at 10/05/20 1045   fluorouracil (ADRUCIL) 5,500 mg in sodium chloride 0.9 % 140 mL chemo infusion  2,400 mg/m2 (Treatment Plan Recorded) Intravenous 1 day or 1 dose Jared Server, MD   5,500 mg at 04/11/22 1118   heparin lock flush 100 unit/mL  500 Units Intravenous Once Lequita Asal, MD        Performance status (ECOG): 1  Vitals Blood pressure (!) 116/94, pulse 66, temperature 98.1 F (36.7 C), resp. rate 18, weight 242 lb 11.2 oz (110.1 kg).   Physical Exam Vitals and nursing note reviewed.  Constitutional:      General: He is not in acute distress.    Appearance: He is well-developed. He is obese. He is not diaphoretic.     Interventions: Face mask in place.  HENT:     Head: Normocephalic and atraumatic.  Eyes:     General: No scleral icterus.    Extraocular Movements: Extraocular movements intact.     Conjunctiva/sclera: Conjunctivae normal.     Pupils: Pupils are equal, round, and reactive to light.  Cardiovascular:     Rate and Rhythm: Normal rate and regular rhythm.     Heart sounds: Normal heart sounds. No murmur heard. Pulmonary:     Effort: Pulmonary effort is normal. No respiratory distress.     Breath sounds: No wheezing.     Comments: Decreased breath sound bilaterally Abdominal:     General: There is no distension.     Palpations: Abdomen is soft. There is no mass.     Tenderness: There is no abdominal tenderness.  Musculoskeletal:        General: No swelling or tenderness. Normal range of motion.      Cervical back: Normal range of motion and neck supple.     Comments: Trace edema bilaterally.   Lymphadenopathy:     Head:     Right side of head: No preauricular, posterior auricular or occipital adenopathy.     Left side of head: No preauricular, posterior auricular or occipital adenopathy.     Cervical: No cervical adenopathy.     Upper Body:     Right upper body: No supraclavicular or axillary adenopathy.     Left upper body: No supraclavicular or axillary adenopathy.     Lower Body: No right inguinal adenopathy. No left inguinal adenopathy.  Skin:    General: Skin is warm and dry.  Neurological:     Mental Status: He is alert and oriented to person, place, and time. Mental status is at baseline.  Psychiatric:        Mood and Affect: Mood normal.    Labs were reviewed by me.     Latest Ref Rng & Units 04/11/2022    8:17 AM 03/28/2022    8:04 AM 03/14/2022    8:02 AM  CBC  WBC 4.0 - 10.5 K/uL 4.5  4.5  4.9   Hemoglobin 13.0 - 17.0 g/dL 11.8  12.1  11.8   Hematocrit 39.0 - 52.0 % 35.5  36.9  36.8   Platelets 150 - 400 K/uL 161  166  178       Latest Ref Rng & Units 04/11/2022    8:17 AM 03/28/2022    8:04 AM 03/14/2022    8:02 AM  CMP  Glucose 70 - 99 mg/dL 98  108  91   BUN  8 - 23 mg/dL _0 Creatinine 0.61 - 1.24 mg/dL 0.66  0.83  0.69   Sodium 135 - 145 mmol/L 135  136  134   Potassium 3.5 - 5.1 mmol/L 4.1  3.7  3.9   Chloride 98 - 111 mmol/L 108  103  104   CO2 22 - 32 mmol/L _1 Calcium 8.9 - 10.3 mg/dL 9.4  9.1  9.0   Total Protein 6.5 - 8.1 g/dL 6.6  6.4  6.6   Total Bilirubin 0.3 - 1.2 mg/dL 0.8  1.2  1.1   Alkaline Phos 38 - 126 U/L 35  39  39   AST 15 - 41 U/L 38  36  33   ALT 0 - 44 U/L 21  17  17

## 2022-04-11 NOTE — Assessment & Plan Note (Signed)
Chemotherapy plan as listed above 

## 2022-04-11 NOTE — Assessment & Plan Note (Signed)
Mild anemia. continue to monitor.  Hemoglobin is close to baseline. 

## 2022-04-11 NOTE — Assessment & Plan Note (Signed)
Patient has gabapentin supply at home.Patient declines gabapentin and acupuncture.

## 2022-04-12 LAB — CEA: CEA: 15 ng/mL — ABNORMAL HIGH (ref 0.0–4.7)

## 2022-04-13 ENCOUNTER — Inpatient Hospital Stay: Payer: Medicare Other

## 2022-04-13 VITALS — BP 156/73 | HR 62 | Temp 96.9°F | Resp 13

## 2022-04-13 DIAGNOSIS — Z85038 Personal history of other malignant neoplasm of large intestine: Secondary | ICD-10-CM

## 2022-04-13 DIAGNOSIS — Z5112 Encounter for antineoplastic immunotherapy: Secondary | ICD-10-CM | POA: Diagnosis not present

## 2022-04-13 DIAGNOSIS — C786 Secondary malignant neoplasm of retroperitoneum and peritoneum: Secondary | ICD-10-CM

## 2022-04-13 MED ORDER — SODIUM CHLORIDE 0.9% FLUSH
10.0000 mL | INTRAVENOUS | Status: DC | PRN
  Administered 2022-04-13: 10 mL
  Filled 2022-04-13: qty 10

## 2022-04-13 MED ORDER — HEPARIN SOD (PORK) LOCK FLUSH 100 UNIT/ML IV SOLN
500.0000 [IU] | Freq: Once | INTRAVENOUS | Status: AC | PRN
  Administered 2022-04-13: 500 [IU]
  Filled 2022-04-13: qty 5

## 2022-04-24 MED FILL — Dexamethasone Sodium Phosphate Inj 100 MG/10ML: INTRAMUSCULAR | Qty: 1 | Status: AC

## 2022-04-25 ENCOUNTER — Encounter: Payer: Self-pay | Admitting: Oncology

## 2022-04-25 ENCOUNTER — Inpatient Hospital Stay (HOSPITAL_BASED_OUTPATIENT_CLINIC_OR_DEPARTMENT_OTHER): Payer: Medicare Other | Admitting: Oncology

## 2022-04-25 ENCOUNTER — Inpatient Hospital Stay: Payer: Medicare Other

## 2022-04-25 ENCOUNTER — Inpatient Hospital Stay: Payer: Medicare Other | Attending: Oncology

## 2022-04-25 VITALS — BP 121/71 | HR 78 | Temp 98.4°F | Wt 237.1 lb

## 2022-04-25 DIAGNOSIS — T451X5A Adverse effect of antineoplastic and immunosuppressive drugs, initial encounter: Secondary | ICD-10-CM

## 2022-04-25 DIAGNOSIS — C786 Secondary malignant neoplasm of retroperitoneum and peritoneum: Secondary | ICD-10-CM

## 2022-04-25 DIAGNOSIS — Z85038 Personal history of other malignant neoplasm of large intestine: Secondary | ICD-10-CM

## 2022-04-25 DIAGNOSIS — Z79899 Other long term (current) drug therapy: Secondary | ICD-10-CM | POA: Insufficient documentation

## 2022-04-25 DIAGNOSIS — Z452 Encounter for adjustment and management of vascular access device: Secondary | ICD-10-CM | POA: Insufficient documentation

## 2022-04-25 DIAGNOSIS — G620 Drug-induced polyneuropathy: Secondary | ICD-10-CM | POA: Insufficient documentation

## 2022-04-25 DIAGNOSIS — D6481 Anemia due to antineoplastic chemotherapy: Secondary | ICD-10-CM | POA: Diagnosis not present

## 2022-04-25 DIAGNOSIS — Z86010 Personal history of colonic polyps: Secondary | ICD-10-CM | POA: Insufficient documentation

## 2022-04-25 DIAGNOSIS — Z836 Family history of other diseases of the respiratory system: Secondary | ICD-10-CM | POA: Insufficient documentation

## 2022-04-25 DIAGNOSIS — E876 Hypokalemia: Secondary | ICD-10-CM | POA: Insufficient documentation

## 2022-04-25 DIAGNOSIS — Z5112 Encounter for antineoplastic immunotherapy: Secondary | ICD-10-CM | POA: Insufficient documentation

## 2022-04-25 DIAGNOSIS — T451X5D Adverse effect of antineoplastic and immunosuppressive drugs, subsequent encounter: Secondary | ICD-10-CM | POA: Insufficient documentation

## 2022-04-25 DIAGNOSIS — Z5111 Encounter for antineoplastic chemotherapy: Secondary | ICD-10-CM | POA: Insufficient documentation

## 2022-04-25 DIAGNOSIS — N400 Enlarged prostate without lower urinary tract symptoms: Secondary | ICD-10-CM | POA: Diagnosis not present

## 2022-04-25 DIAGNOSIS — K5909 Other constipation: Secondary | ICD-10-CM

## 2022-04-25 DIAGNOSIS — Z809 Family history of malignant neoplasm, unspecified: Secondary | ICD-10-CM | POA: Diagnosis not present

## 2022-04-25 DIAGNOSIS — C184 Malignant neoplasm of transverse colon: Secondary | ICD-10-CM

## 2022-04-25 DIAGNOSIS — K5900 Constipation, unspecified: Secondary | ICD-10-CM | POA: Diagnosis not present

## 2022-04-25 DIAGNOSIS — Z803 Family history of malignant neoplasm of breast: Secondary | ICD-10-CM | POA: Insufficient documentation

## 2022-04-25 LAB — CBC WITH DIFFERENTIAL/PLATELET
Abs Immature Granulocytes: 0 10*3/uL (ref 0.00–0.07)
Basophils Absolute: 0 10*3/uL (ref 0.0–0.1)
Basophils Relative: 1 %
Eosinophils Absolute: 0.1 10*3/uL (ref 0.0–0.5)
Eosinophils Relative: 2 %
HCT: 36.2 % — ABNORMAL LOW (ref 39.0–52.0)
Hemoglobin: 11.7 g/dL — ABNORMAL LOW (ref 13.0–17.0)
Immature Granulocytes: 0 %
Lymphocytes Relative: 41 %
Lymphs Abs: 1.5 10*3/uL (ref 0.7–4.0)
MCH: 30.1 pg (ref 26.0–34.0)
MCHC: 32.3 g/dL (ref 30.0–36.0)
MCV: 93.1 fL (ref 80.0–100.0)
Monocytes Absolute: 0.4 10*3/uL (ref 0.1–1.0)
Monocytes Relative: 10 %
Neutro Abs: 1.7 10*3/uL (ref 1.7–7.7)
Neutrophils Relative %: 46 %
Platelets: 204 10*3/uL (ref 150–400)
RBC: 3.89 MIL/uL — ABNORMAL LOW (ref 4.22–5.81)
RDW: 15.4 % (ref 11.5–15.5)
WBC: 3.7 10*3/uL — ABNORMAL LOW (ref 4.0–10.5)
nRBC: 0 % (ref 0.0–0.2)

## 2022-04-25 LAB — COMPREHENSIVE METABOLIC PANEL
ALT: 15 U/L (ref 0–44)
AST: 35 U/L (ref 15–41)
Albumin: 3.3 g/dL — ABNORMAL LOW (ref 3.5–5.0)
Alkaline Phosphatase: 37 U/L — ABNORMAL LOW (ref 38–126)
Anion gap: 9 (ref 5–15)
BUN: 11 mg/dL (ref 8–23)
CO2: 25 mmol/L (ref 22–32)
Calcium: 9.4 mg/dL (ref 8.9–10.3)
Chloride: 102 mmol/L (ref 98–111)
Creatinine, Ser: 0.78 mg/dL (ref 0.61–1.24)
GFR, Estimated: >60 mL/min (ref >60)
Glucose, Bld: 145 mg/dL — ABNORMAL HIGH (ref 70–99)
Potassium: 3.7 mmol/L (ref 3.5–5.1)
Sodium: 136 mmol/L (ref 135–145)
Total Bilirubin: 0.9 mg/dL (ref 0.3–1.2)
Total Protein: 6.9 g/dL (ref 6.5–8.1)

## 2022-04-25 LAB — PROTEIN, URINE, RANDOM: Total Protein, Urine: 14 mg/dL

## 2022-04-25 MED ORDER — SODIUM CHLORIDE 0.9 % IV SOLN
10.0000 mg | Freq: Once | INTRAVENOUS | Status: AC
  Administered 2022-04-25: 10 mg via INTRAVENOUS
  Filled 2022-04-25: qty 10

## 2022-04-25 MED ORDER — SENNOSIDES-DOCUSATE SODIUM 8.6-50 MG PO TABS: 2.0000 | ORAL_TABLET | Freq: Every day | ORAL | 3 refills | Status: DC

## 2022-04-25 MED ORDER — SODIUM CHLORIDE 0.9 % IV SOLN
Freq: Once | INTRAVENOUS | Status: AC
  Filled 2022-04-25: qty 250

## 2022-04-25 MED ORDER — POTASSIUM CHLORIDE CRYS ER 20 MEQ PO TBCR: 20.0000 meq | EXTENDED_RELEASE_TABLET | Freq: Every day | ORAL | 1 refills | Status: DC

## 2022-04-25 MED ORDER — SODIUM CHLORIDE 0.9 % IV SOLN
900.0000 mg | Freq: Once | INTRAVENOUS | Status: AC
  Administered 2022-04-25: 900 mg via INTRAVENOUS
  Filled 2022-04-25: qty 45

## 2022-04-25 MED ORDER — SODIUM CHLORIDE 0.9 % IV SOLN
2400.0000 mg/m2 | INTRAVENOUS | Status: DC
  Administered 2022-04-25: 5500 mg via INTRAVENOUS
  Filled 2022-04-25: qty 110

## 2022-04-25 MED ORDER — FLUOROURACIL CHEMO INJECTION 2.5 GM/50ML
400.0000 mg/m2 | Freq: Once | INTRAVENOUS | Status: AC
  Administered 2022-04-25: 900 mg via INTRAVENOUS
  Filled 2022-04-25: qty 18

## 2022-04-25 MED ORDER — SODIUM CHLORIDE 0.9 % IV SOLN
5.0000 mg/kg | Freq: Once | INTRAVENOUS | Status: AC
  Administered 2022-04-25: 500 mg via INTRAVENOUS
  Filled 2022-04-25: qty 16

## 2022-04-25 NOTE — Assessment & Plan Note (Signed)
Potassium has been stable. Decrease to potassium chloride 68mq daily.

## 2022-04-25 NOTE — Assessment & Plan Note (Signed)
Mild anemia. continue to monitor.  Hemoglobin is close to baseline. 

## 2022-04-25 NOTE — Patient Instructions (Signed)
[facility] AT [place]  Discharge Instructions: Thank you for choosing [facility] to provide your oncology and hematology care.  If you have a lab appointment with the [facility], please go directly to the [facility] and check in at the registration area.  Wear comfortable clothing and clothing appropriate for easy access to any Portacath or PICC line.   We strive to give you quality time with your provider. You may need to reschedule your appointment if you arrive late (15 or more minutes).  Arriving late affects you and other patients whose appointments are after yours.  Also, if you miss three or more appointments without notifying the office, you may be dismissed from the clinic at the provider's discretion.      For prescription refill requests, have your pharmacy contact our office and allow 72 hours for refills to be completed.    Today you received the following chemotherapy and/or immunotherapy agents Mvasi, Leucovorin, Adrucil.       To help prevent nausea and vomiting after your treatment, we encourage you to take your nausea medication as directed.  BELOW ARE SYMPTOMS THAT SHOULD BE REPORTED IMMEDIATELY: *FEVER GREATER THAN 100.4 F (38 C) OR HIGHER *CHILLS OR SWEATING *NAUSEA AND VOMITING THAT IS NOT CONTROLLED WITH YOUR NAUSEA MEDICATION *UNUSUAL SHORTNESS OF BREATH *UNUSUAL BRUISING OR BLEEDING *URINARY PROBLEMS (pain or burning when urinating, or frequent urination) *BOWEL PROBLEMS (unusual diarrhea, constipation, pain near the anus) TENDERNESS IN MOUTH AND THROAT WITH OR WITHOUT PRESENCE OF ULCERS (sore throat, sores in mouth, or a toothache) UNUSUAL RASH, SWELLING OR PAIN  UNUSUAL VAGINAL DISCHARGE OR ITCHING   Items with * indicate a potential emergency and should be followed up as soon as possible or go to the Emergency Department if any problems should occur.  Please show the CHEMOTHERAPY ALERT CARD or IMMUNOTHERAPY ALERT  CARD at check-in to the Emergency Department and triage nurse.  Should you have questions after your visit or need to cancel or reschedule your appointment, please contact [facility] [place] AT [place]  [phone] and follow the prompts.  Office hours are 8:00 a.m. to 4:30 p.m. Monday - Friday. Please note that voicemails left after 4:00 p.m. may not be returned until the following business day.  We are closed weekends and major holidays. You have access to a nurse at all times for urgent questions. Please call the main number to the clinic [phone] and follow the prompts.  For any non-urgent questions, you may also contact your provider using MyChart. We now offer e-Visits for anyone 60 and older to request care online for non-urgent symptoms. For details visit [email or web address].   Also download the MyChart app! Go to the app store, search "MyChart", open the app, select [facility], and log in with your MyChart username and password.  Masks are optional in the cancer centers. If you would like for your care team to wear a mask while they are taking care of you, please let them know. For doctor visits, patients may have with them one support person who is at least 81 years old. At this time, visitors are not allowed in the infusion area.

## 2022-04-25 NOTE — Progress Notes (Signed)
Patient complains of constipation.  Had blood in his stool.  Using the Miralax and stool softner.

## 2022-04-25 NOTE — Assessment & Plan Note (Addendum)
He has been on Colace 100mg 1-2 times daily. And Miralax daily.  Recommend change to Senna-S 2 tablet daily, if needed, he can take another dose of colace 100mg daily. Miralax daily PRN.  Discussed about GI evaluation for blood in stool. Patient would like to defer GI work up and try new bowel regimen first.  Encourage patient to increase oral hydration.  

## 2022-04-25 NOTE — Assessment & Plan Note (Signed)
Chemotherapy plan as listed above 

## 2022-04-25 NOTE — Assessment & Plan Note (Addendum)
Overall he tolerates chemotherapy.   CEA is relatively stable with small fluctuations. CT images were reviewed and findings were discussed. Stable disease.  Labs reviewed and discussed with patient Proceed with 5-FU/bevacizumab treatment today Continue current regimen.  Ok to proceed with teeth extraction 10-12 days after chemotherapy. Wife will update office once they have dental procedure date, and I plan to postpone his chemo for 1 week to allow adequate healing.

## 2022-04-25 NOTE — Progress Notes (Signed)
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   CEA is relatively stable with small fluctuations. CT images were reviewed and findings were discussed. Stable disease.  Labs reviewed and discussed with patient Proceed with 5-FU/bevacizumab treatment today Continue current regimen.  Ok to proceed with teeth extraction 10-12 days after chemotherapy. Wife will update office once they have dental procedure date, and I plan to postpone his chemo for 1 week to allow adequate healing.   Encounter for antineoplastic chemotherapy Chemotherapy plan as listed above.   Anemia due to chemotherapy Mild anemia. continue to monitor.  Hemoglobin is close to baseline.  Constipation He has been on Colace 166m 1-2 times daily. And Miralax daily.  Recommend change to Senna-S 2 tablet daily, if needed, he can take another dose of colace 1034mdaily. Miralax daily PRN.  Discussed about GI evaluation for blood in stool. Patient would like to defer GI work up and try new bowel regimen first.  Encourage patient to increase oral hydration.   Hypokalemia Potassium has been stable. Decrease to potassium chloride 2056mdaily.    Follow-up 2 weeks lab MD 5-FU/bevacizumab -   All questions were answered. The patient knows to call the clinic with any problems, questions or concerns.  Jared ServerD, PhD [facility] 04/25/2022   Chief Complaint: [name] a 81 Gutierrez. male presents for follow-up of metastatic colon cancer   PERTINENT ONCOLOGY HISTORY Jared Gutierrez.amale who has above oncology history reviewed by me today presented for follow up visit for  management of Metastatic colon cancer. [name], patient switched care to me on 11/11/20 Extensive medical record review was performed by me  Oncology History  History of colon cancer, stage I  10/02/2014 Pathology Results   [id number] colonoscopy pathology A. CECAL POLYPS; HOT SNARE AND COLD BIOPSY:  - TUBULAR ADENOMA, MULTIPLE FRAGMENTS.  - NEGATIVE FOR HIGH-GRADE DYSPLASIA AND MALIGNANCY.   B. COLON POLYP, TRANSVERSE; COLD BIOPSY:  - TUBULAR ADENOMA.  - NEGATIVE FOR HIGH-GRADE DYSPLASIA AND MALIGNANCY.     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  Tumor extended into the submucosa. Margins were negative. + lymphovascular invasion. 14 lymph nodes were negative. Pathologic stage was T1 N0.  TRANSVERSE COLON, RESECTION:  - INVASIVE ADENOCARCINOMA ARISING IN A 4.6 CM TUBULOVILLOUS  ADENOMA WITH HIGH GRADE DYSPLASIA (MALIGNANT POLYP).  -  SEE SUMMARY BELOW.  [name]  ONCOLOGY SUMMARY: COLON AND RECTUM, RESECTION, AJCC 7TH EDITION  Specimen: transverse colon  Procedure: transverse colon resection  Tumor site: splenic flexure  Tumor size: 1.0 cm (invasive component)  Macroscopic tumor  perforation: not specified  Histologic type: invasive adenocarcinoma  Histologic grade: moderately differentiated  Microscopic tumor extension: into submucosa  Margins:       Proximal margin: negative       Distal margin: negative       Circumferential (radial) or mesenteric margin: negative       If all margins uninvolved by invasive carcinoma:       Distance of invasive carcinoma from closest margin: 7.5 cm  to distal margin  Treatment effect: not applicable  Lymph-vascular invasion: present  Perineural invasion: not identified  Tumor deposits (discontinuous extramural extension): not  identified  Pathologic staging:       Primary tumor: pT1       Regional lymph nodes: pN0            Number of nodes examined: 14            Number of nodes involved: 0    04/02/2019 Progression   04/02/2019  PET scan  limited evealed a 2.4 x 2.3 cm (SUV 11) hypermetabolic soft tissue density caudal and anterior to the pancreatic neck favored to represent isolated peritoneal or nodal metastasis in the setting of prior transverse colonic resection (expected primary drainage). Although this was immediately adjacent to the pancreas, a fat plane was maintained, arguing strongly against a pancreatic primary. Otherwise, there was no evidence of hypermetabolic metastasis. 04/17/2019 EUS on  revealed a normal esophagus, stomach, duodenum, and pancreas.  There was a 2.4 x 2.4 cm irregular mass in the retroperitoneum adjacent to, but not involving the pancreatic neck.  FNA and core needle biopsy were performed.  Pathology revealed adenocarcinoma compatible with a metastatic lesion of colorectal origin.  Tumor cells were positive for CK20 and CDX2 and negative for CK7.  NGS: [employer] on 05/15/2019 revealed + KRASG13D and TP53.  Negative results included BRAF V600E, Her2, NRAS, NTRK, PD-L1 (<1%), and TMB 8.7/Mb (intermediate).  MMR testing from his colon resection on 10/16/2013 was intact with a low probability of  MSI-H.   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  He received Neulasta after cycle #4 and #5 secondary to progressive leukopenia.  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  He received a truncated course of FOLFOX with cycle #11 secondary to oxaliplatin reaction.   01/14/2020 Imaging    PET revealed an interval decrease in size and FDG uptake (2.4 x 2.3 cm with SUV 11 to 2.4 x 1.7 cm with SUV 4.09) associated with the previously referenced soft tissue density caudal and anterior to the pancreatic neck suggesting treatment response. There were no new sites of FDG avid tumor.   08/31/2020 Imaging   08/31/2020 Abdomen and pelvis CT revealed increased size of the index soft tissue lesion inferior and anterior to the pancreatic neck (2.9 x 1.8 cm to 3.5 x 2.9 cm). There were similar prominent retroperitoneal lymph nodes without adenopathy by size criteria. There was no new or enlarging abdominal or pelvic lymph nodes. There were no new interval findings. There was similar circumferential wall thickening of a nondistended urinary bladder, which likely accentuated wall thickening There was  hepatic steatosis and aortic atherosclerosis.   11/03/2020, PET showed recurrent peritoneal metastasis in the upper abdomen adjacent to the pancreas.  The lesion is 3.8 x 2.9 cm with SUV of 11.3. No evidence of metastatic peritoneal disease elsewhere in the abdomen pelvis.  No evidence of distant metastasis.   11/03/2020 Imaging   PET scan showed recurrent peritoneal metastasis near pancreas. Given that he has no other distant metastasis. Discussed with radiation oncology. Repeat SBRT may be considered if he does not respond well to systemic chemotherapy.     11/26/2020 -  Chemotherapy   5-FU and bevacizumab.   02/17/2021 Imaging   02/17/2021 CT abdomen pelvis  showed partial response. Mild decrease of peritoneal lesion.  Proceed with 5-FU and bevacizumab today.  Given that he is tolerating current regimen with good life quality, partial response.  Shared decision was made to hold off adding additional chemotherapy agents.   06/08/2021, CT chest abdomen pelvis with contrast showed soft tissue mass at the base of mesentery inferior to the pancreatic neck is unchanged in size.  No progressive disease was identified.   09/07/2021, CT chest abdomen pelvis with contrast showed no substantial changes in size of the mesenteric mass, 2.6 cm.  No suspicious new lesions.  Chronic findings as detailed in the imaging report.   12/05/2021 Imaging   PET scan showed soft tissue mesenteric mass is unchanged in size.  Decreased FDG uptake compared to activity on November 03, 2020.  Fever treated disease.  No new metastatic disease.   03/21/2022 Imaging   CT chest abdomen pelvis  1. Similar size of the peripancreatic presumed peritoneal implant compared to 09/07/2021. 2. No new or progressive disease. 3. Incidental findings, including: Coronary artery atherosclerosis.Aortic Atherosclerosis (ICD10-I70.0). Prostatomegaly with chronic bladder wall thickening, suggesting outlet obstruction.        Other medical problems Chronic lower extremity edema. 12/24/2018, right lower extremity duplex negative for DVT.  Small right Baker's cyst. 08/16/2019, bilateral lower extremity duplex showed no DVT. 08/16/2019 - 08/17/2019 with right lower extremity cellulitis.  He was unable to bear weight.  He was treated with IV fluids, NSAIDs, colchicine, and broad antibiotics (vancomycin and Cefepime).  He was discharged on indomethacin x 5 days and Keflex 500 mg TID x 5 days.  10/05/2020, colonoscopy showed internal hemorrhoids.  Otherwise normal examination.  INTERVAL HISTORY Jared Gutierrez is a 81 y.o. male who has above history reviewed by me today presents for follow up visit for  management of recurrent metastatic colon cancer Patient was accompanied by wife.   Denies fever, chills, nausea, vomiting, diarrhea, chest pain, shortness of breath, abdominal pain, urinary symptoms.  + He has some blood in the stool recently he take colace 1-2 time daily and Miralax.  Patient need 4 teeth extraction in the near future.   Review of Systems  Constitutional:  Negative for appetite change, chills, diaphoresis, fever and unexpected weight change.  HENT:   Negative for hearing loss, nosebleeds, sore throat and tinnitus.   Respiratory:  Negative for cough, hemoptysis and shortness of breath.   Cardiovascular:  Negative for chest pain and palpitations.  Gastrointestinal:  Negative for abdominal pain, blood in stool, constipation, diarrhea, nausea and vomiting.  Genitourinary:  Negative for dysuria, frequency and hematuria.   Musculoskeletal:  Positive for arthralgias. Negative for back pain, myalgias and neck pain.  Skin:  Negative for itching and rash.  Neurological:  Positive for numbness. Negative for dizziness and headaches.  Hematological:  Does not bruise/bleed easily.  Psychiatric/Behavioral:  Negative for depression. The patient is not nervous/anxious.       Past Medical History:  Diagnosis Date   Arthritis    OSTEOARTHRITIS   Cancer ([place])    Cavitary lesion of lung    RIGHT LOWER LOBE   Chicken pox    Colon cancer ([facility])    History of kidney stones    Hypertension    Lipoma of colon    Nephrolithiasis    Nephrolithiasis    Obesity    Shingles    Tubular adenoma of colon    multiple fragments    Past Surgical History:  Procedure Laterality Date   COLON SURGERY     COLONOSCOPY N/A 10/02/2014   Procedure: COLONOSCOPY;  Surgeon: Josefine Class, MD;  Location: [facility] ENDOSCOPY;  Service: Endoscopy;  Laterality: N/A;   COLONOSCOPY WITH PROPOFOL N/A 02/16/2017   Procedure: COLONOSCOPY WITH PROPOFOL;  Surgeon: Manya Silvas, MD;  Location: [facility]  ENDOSCOPY;  Service: Endoscopy;  Laterality: N/A;   COLONOSCOPY WITH PROPOFOL N/A 10/05/2020   Procedure: COLONOSCOPY WITH PROPOFOL;  Surgeon: Lesly Rubenstein, MD;  Location: [facility] ENDOSCOPY;  Service: Endoscopy;  Laterality: N/A;   EUS N/A 04/17/2019   Procedure: FULL UPPER ENDOSCOPIC ULTRASOUND (EUS) RADIAL;  Surgeon: Holly Bodily, MD;  Location: [facility] ENDOSCOPY;  Service: Gastroenterology;  Laterality: N/A;   KIDNEY STONE SURGERY     PARTIAL COLECTOMY  10/17/2013   PORTACATH PLACEMENT Right 06/13/2019   Procedure: INSERTION PORT-A-CATH;  Surgeon: Benjamine Sprague, DO;  Location: [facility] ORS;  Service: General;  Laterality: Right;    Family History  Problem Relation Age of Onset   Cancer Mother    Breast cancer Mother    COPD Father     Social History:  reports that he has never smoked. He has never used smokeless tobacco. He reports current alcohol use. He reports that he does not use drugs.    Allergies: No Known Allergies  Current Medications: Current Outpatient Medications  Medication Sig Dispense Refill   acetaminophen (TYLENOL) 500 MG tablet Take 500 mg by mouth every 6 (six) hours as needed.     amLODipine (NORVASC) 2.5 MG tablet Take 2.5 mg by mouth daily.     amoxicillin (AMOXIL) 500 MG capsule Take 500 mg by mouth 3 (three) times daily.     aspirin EC 81 MG tablet Take 81 mg by mouth daily.     Calcium Carbonate (CALCIUM 600 PO) Take 1 tablet by mouth 2 (two) times daily.     Cholecalciferol (VITAMIN D) 125 MCG (5000 UT) CAPS Take 5,000 mg by mouth.     docusate sodium (COLACE) 100 MG capsule Take 1 capsule (100 mg total) by mouth 2 (two) times daily as needed for mild constipation or moderate constipation. 60 capsule 3   HYDROcodone-acetaminophen (NORCO/VICODIN) 5-325 MG tablet Take 1 tablet by mouth every 6 (six) hours as needed for moderate pain (up to 3 doses for moderate pain.).     lidocaine-prilocaine (EMLA) cream APPLY TO AFFECTED AREA ONCE AS DIRECTED 30 g 3    loperamide (IMODIUM) 2 MG capsule Take 1 capsule (2 mg total) by mouth See admin instructions. Take 2 tablets after first loose stool,  then 1 tablet  after each loose stool; maximum: 8 tablets /day 60 capsule 0   loratadine (CLARITIN) 10 MG tablet Take 10 mg by mouth daily.     olmesartan (BENICAR) 20 MG tablet Take 1 tablet by mouth daily.     ondansetron (  ZOFRAN) 8 MG tablet Take 1 tablet (8 mg total) by mouth 2 (two) times daily as needed for refractory nausea / vomiting. Start on day 3 after chemotherapy. 60 tablet 1   Probiotic Product (PROBIOTIC DAILY PO) Take 1 tablet by mouth daily.     prochlorperazine (COMPAZINE) 10 MG tablet Take 1 tablet (10 mg total) by mouth every 6 (six) hours as needed (NAUSEA). 30 tablet 1   senna-docusate (SENNA S) 8.6-50 MG tablet Take 2 tablets by mouth daily. 60 tablet 3   potassium chloride SA (KLOR-CON M20) 20 MEQ tablet Take 1 tablet (20 mEq total) by mouth daily. 180 tablet 1   No current facility-administered medications for this visit.   Facility-Administered Medications Ordered in Other Visits  Medication Dose Route Frequency Provider Last Rate Last Admin   0.9 %  sodium chloride infusion   Intravenous Once Corcoran, Melissa C, MD       0.9 %  sodium chloride infusion   Intravenous Continuous Lequita Asal, MD 10 mL/hr at 12/31/19 1000 New Bag at 10/05/20 1045   fluorouracil (ADRUCIL) 5,500 mg in sodium chloride 0.9 % 140 mL chemo infusion  2,400 mg/m2 (Treatment Plan Recorded) Intravenous 1 day or 1 dose [name], MD   Infusion Verify at 04/25/22 1246   heparin lock flush 100 unit/mL  500 Units Intravenous Once Lequita Asal, MD        Performance status (ECOG): 1  Vitals Blood pressure 121/71, pulse 78, temperature 98.4 F (36.9 C), temperature source Tympanic, weight 237 lb 1.6 oz (107.5 kg).   Physical Exam Vitals and nursing note reviewed.  Constitutional:      General: He is not in acute distress.    Appearance: He is  well-developed. He is obese. He is not diaphoretic.     Interventions: Face mask in place.  HENT:     Head: Normocephalic and atraumatic.  Eyes:     General: No scleral icterus.    Extraocular Movements: Extraocular movements intact.     Conjunctiva/sclera: Conjunctivae normal.     Pupils: Pupils are equal, round, and reactive to light.  Cardiovascular:     Rate and Rhythm: Normal rate and regular rhythm.     Heart sounds: Normal heart sounds. No murmur heard. Pulmonary:     Effort: Pulmonary effort is normal. No respiratory distress.     Breath sounds: No wheezing.     Comments: Decreased breath sound bilaterally Abdominal:     General: There is no distension.     Palpations: Abdomen is soft. There is no mass.     Tenderness: There is no abdominal tenderness.  Musculoskeletal:        General: No swelling or tenderness. Normal range of motion.     Cervical back: Normal range of motion and neck supple.     Comments: Trace edema bilaterally.   Lymphadenopathy:     Head:     Right side of head: No preauricular, posterior auricular or occipital adenopathy.     Left side of head: No preauricular, posterior auricular or occipital adenopathy.     Cervical: No cervical adenopathy.     Upper Body:     Right upper body: No supraclavicular or axillary adenopathy.     Left upper body: No supraclavicular or axillary adenopathy.     Lower Body: No right inguinal adenopathy. No left inguinal adenopathy.  Skin:    General: Skin is warm and dry.  Neurological:     Mental  Status: He is alert and oriented to person, place, and time. Mental status is at baseline.  Psychiatric:        Mood and Affect: Mood normal.    Labs were reviewed by me.     Latest Ref Rng & Units 04/25/2022    8:39 AM 04/11/2022    8:17 AM 03/28/2022    8:04 AM  CBC  WBC 4.0 - 10.5 K/uL 3.7  4.5  4.5   Hemoglobin 13.0 - 17.0 g/dL 11.7  11.8  12.1   Hematocrit 39.0 - 52.0 % 36.2  35.5  36.9   Platelets 150 - 400  K/uL 204  161  166       Latest Ref Rng & Units 04/25/2022    8:39 AM 04/11/2022    8:17 AM 03/28/2022    8:04 AM  CMP  Glucose 70 - 99 mg/dL 145  98  108   BUN 8 - 23 mg/dL _0 Creatinine 0.61 - 1.24 mg/dL 0.78  0.66  0.83   Sodium 135 - 145 mmol/L 136  135  136   Potassium 3.5 - 5.1 mmol/L 3.7  4.1  3.7   Chloride 98 - 111 mmol/L 102  108  103   CO2 22 - 32 mmol/L _1 Calcium 8.9 - 10.3 mg/dL 9.4  9.4  9.1   Total Protein 6.5 - 8.1 g/dL 6.9  6.6  6.4   Total Bilirubin 0.3 - 1.2 mg/dL 0.9  0.8  1.2   Alkaline Phos 38 - 126 U/L 37  35  39   AST 15 - 41 U/L 35  38  36   ALT 0 - 44 U/L 15  21  17

## 2022-04-27 ENCOUNTER — Inpatient Hospital Stay: Payer: Medicare Other

## 2022-04-27 DIAGNOSIS — C786 Secondary malignant neoplasm of retroperitoneum and peritoneum: Secondary | ICD-10-CM

## 2022-04-27 DIAGNOSIS — Z85038 Personal history of other malignant neoplasm of large intestine: Secondary | ICD-10-CM

## 2022-04-27 DIAGNOSIS — Z5112 Encounter for antineoplastic immunotherapy: Secondary | ICD-10-CM | POA: Diagnosis not present

## 2022-04-27 LAB — CEA: CEA: 15.6 ng/mL — ABNORMAL HIGH (ref 0.0–4.7)

## 2022-04-27 MED ORDER — SODIUM CHLORIDE 0.9% FLUSH
10.0000 mL | INTRAVENOUS | Status: DC | PRN
  Administered 2022-04-27: 10 mL
  Filled 2022-04-27: qty 10

## 2022-04-27 MED ORDER — HEPARIN SOD (PORK) LOCK FLUSH 100 UNIT/ML IV SOLN
500.0000 [IU] | Freq: Once | INTRAVENOUS | Status: AC | PRN
  Administered 2022-04-27: 500 [IU]
  Filled 2022-04-27: qty 5

## 2022-05-05 MED FILL — Dexamethasone Sodium Phosphate Inj 100 MG/10ML: INTRAMUSCULAR | Qty: 1 | Status: AC

## 2022-05-09 ENCOUNTER — Encounter: Payer: Self-pay | Admitting: *Deleted

## 2022-05-09 ENCOUNTER — Inpatient Hospital Stay: Payer: Medicare Other

## 2022-05-09 ENCOUNTER — Encounter: Payer: Self-pay | Admitting: Oncology

## 2022-05-09 ENCOUNTER — Inpatient Hospital Stay (HOSPITAL_BASED_OUTPATIENT_CLINIC_OR_DEPARTMENT_OTHER): Payer: Medicare Other | Admitting: Oncology

## 2022-05-09 VITALS — BP 112/73 | HR 75 | Temp 98.1°F | Wt 238.9 lb

## 2022-05-09 DIAGNOSIS — C184 Malignant neoplasm of transverse colon: Secondary | ICD-10-CM

## 2022-05-09 DIAGNOSIS — D6481 Anemia due to antineoplastic chemotherapy: Secondary | ICD-10-CM

## 2022-05-09 DIAGNOSIS — Z5111 Encounter for antineoplastic chemotherapy: Secondary | ICD-10-CM

## 2022-05-09 DIAGNOSIS — Z85038 Personal history of other malignant neoplasm of large intestine: Secondary | ICD-10-CM

## 2022-05-09 DIAGNOSIS — C786 Secondary malignant neoplasm of retroperitoneum and peritoneum: Secondary | ICD-10-CM | POA: Diagnosis not present

## 2022-05-09 DIAGNOSIS — T451X5A Adverse effect of antineoplastic and immunosuppressive drugs, initial encounter: Secondary | ICD-10-CM

## 2022-05-09 DIAGNOSIS — G609 Hereditary and idiopathic neuropathy, unspecified: Secondary | ICD-10-CM

## 2022-05-09 DIAGNOSIS — Z5112 Encounter for antineoplastic immunotherapy: Secondary | ICD-10-CM | POA: Diagnosis not present

## 2022-05-09 LAB — CBC WITH DIFFERENTIAL/PLATELET
Abs Immature Granulocytes: 0 10*3/uL (ref 0.00–0.07)
Basophils Absolute: 0 10*3/uL (ref 0.0–0.1)
Basophils Relative: 1 %
Eosinophils Absolute: 0.1 10*3/uL (ref 0.0–0.5)
Eosinophils Relative: 2 %
HCT: 34.9 % — ABNORMAL LOW (ref 39.0–52.0)
Hemoglobin: 11.5 g/dL — ABNORMAL LOW (ref 13.0–17.0)
Immature Granulocytes: 0 %
Lymphocytes Relative: 41 %
Lymphs Abs: 1.6 10*3/uL (ref 0.7–4.0)
MCH: 30.3 pg (ref 26.0–34.0)
MCHC: 33 g/dL (ref 30.0–36.0)
MCV: 92.1 fL (ref 80.0–100.0)
Monocytes Absolute: 0.4 10*3/uL (ref 0.1–1.0)
Monocytes Relative: 10 %
Neutro Abs: 1.8 10*3/uL (ref 1.7–7.7)
Neutrophils Relative %: 46 %
Platelets: 166 10*3/uL (ref 150–400)
RBC: 3.79 MIL/uL — ABNORMAL LOW (ref 4.22–5.81)
RDW: 15.5 % (ref 11.5–15.5)
WBC: 4 10*3/uL (ref 4.0–10.5)
nRBC: 0 % (ref 0.0–0.2)

## 2022-05-09 LAB — COMPREHENSIVE METABOLIC PANEL
ALT: 14 U/L (ref 0–44)
AST: 31 U/L (ref 15–41)
Albumin: 3.4 g/dL — ABNORMAL LOW (ref 3.5–5.0)
Alkaline Phosphatase: 38 U/L (ref 38–126)
Anion gap: 4 — ABNORMAL LOW (ref 5–15)
BUN: 12 mg/dL (ref 8–23)
CO2: 24 mmol/L (ref 22–32)
Calcium: 9.4 mg/dL (ref 8.9–10.3)
Chloride: 106 mmol/L (ref 98–111)
Creatinine, Ser: 0.71 mg/dL (ref 0.61–1.24)
GFR, Estimated: >60 mL/min (ref >60)
Glucose, Bld: 124 mg/dL — ABNORMAL HIGH (ref 70–99)
Potassium: 4.1 mmol/L (ref 3.5–5.1)
Sodium: 134 mmol/L — ABNORMAL LOW (ref 135–145)
Total Bilirubin: 1 mg/dL (ref 0.3–1.2)
Total Protein: 6.6 g/dL (ref 6.5–8.1)

## 2022-05-09 LAB — PROTEIN, URINE, RANDOM: Total Protein, Urine: 10 mg/dL

## 2022-05-09 MED ORDER — SODIUM CHLORIDE 0.9 % IV SOLN
Freq: Once | INTRAVENOUS | Status: AC
  Filled 2022-05-09: qty 250

## 2022-05-09 MED ORDER — SODIUM CHLORIDE 0.9 % IV SOLN
Freq: Once | INTRAVENOUS | Status: DC
  Filled 2022-05-09: qty 250

## 2022-05-09 MED ORDER — SODIUM CHLORIDE 0.9 % IV SOLN
5.0000 mg/kg | Freq: Once | INTRAVENOUS | Status: AC
  Administered 2022-05-09: 500 mg via INTRAVENOUS
  Filled 2022-05-09: qty 16

## 2022-05-09 MED ORDER — SODIUM CHLORIDE 0.9 % IV SOLN
900.0000 mg | Freq: Once | INTRAVENOUS | Status: AC
  Administered 2022-05-09: 900 mg via INTRAVENOUS
  Filled 2022-05-09: qty 45

## 2022-05-09 MED ORDER — FLUOROURACIL CHEMO INJECTION 2.5 GM/50ML
400.0000 mg/m2 | Freq: Once | INTRAVENOUS | Status: AC
  Administered 2022-05-09: 900 mg via INTRAVENOUS
  Filled 2022-05-09: qty 18

## 2022-05-09 MED ORDER — SODIUM CHLORIDE 0.9 % IV SOLN
10.0000 mg | Freq: Once | INTRAVENOUS | Status: AC
  Administered 2022-05-09: 10 mg via INTRAVENOUS
  Filled 2022-05-09: qty 10

## 2022-05-09 MED ORDER — SODIUM CHLORIDE 0.9 % IV SOLN
2400.0000 mg/m2 | INTRAVENOUS | Status: DC
  Administered 2022-05-09: 5500 mg via INTRAVENOUS
  Filled 2022-05-09: qty 110

## 2022-05-09 NOTE — Progress Notes (Signed)
Acupuncture approval sent to [facility].   They will call him to schedule.

## 2022-05-09 NOTE — Assessment & Plan Note (Signed)
Chemotherapy plan as listed above 

## 2022-05-09 NOTE — Assessment & Plan Note (Addendum)
Overall he tolerates chemotherapy.   CEA is relatively stable with small fluctuations. CT images were reviewed and findings were discussed. Stable disease.  Labs reviewed and discussed with patient Proceed with 5-FU/bevacizumab treatment today Continue current regimen.

## 2022-05-09 NOTE — Patient Instructions (Signed)
[facility] AT [place]  Discharge Instructions: Thank you for choosing [facility] to provide your oncology and hematology care.  If you have a lab appointment with the [facility], please go directly to the [facility] and check in at the registration area.  Wear comfortable clothing and clothing appropriate for easy access to any Portacath or PICC line.   We strive to give you quality time with your provider. You may need to reschedule your appointment if you arrive late (15 or more minutes).  Arriving late affects you and other patients whose appointments are after yours.  Also, if you miss three or more appointments without notifying the office, you may be dismissed from the clinic at the provider's discretion.      For prescription refill requests, have your pharmacy contact our office and allow 72 hours for refills to be completed.    Today you received the following chemotherapy and/or immunotherapy agents VEGZEMA, LEUCOVORIN, 5 FU      To help prevent nausea and vomiting after your treatment, we encourage you to take your nausea medication as directed.  BELOW ARE SYMPTOMS THAT SHOULD BE REPORTED IMMEDIATELY: *FEVER GREATER THAN 100.4 F (38 C) OR HIGHER *CHILLS OR SWEATING *NAUSEA AND VOMITING THAT IS NOT CONTROLLED WITH YOUR NAUSEA MEDICATION *UNUSUAL SHORTNESS OF BREATH *UNUSUAL BRUISING OR BLEEDING *URINARY PROBLEMS (pain or burning when urinating, or frequent urination) *BOWEL PROBLEMS (unusual diarrhea, constipation, pain near the anus) TENDERNESS IN MOUTH AND THROAT WITH OR WITHOUT PRESENCE OF ULCERS (sore throat, sores in mouth, or a toothache) UNUSUAL RASH, SWELLING OR PAIN  UNUSUAL VAGINAL DISCHARGE OR ITCHING   Items with * indicate a potential emergency and should be followed up as soon as possible or go to the Emergency Department if any problems should occur.  Please show the CHEMOTHERAPY ALERT CARD or IMMUNOTHERAPY ALERT CARD  at check-in to the Emergency Department and triage nurse.  Should you have questions after your visit or need to cancel or reschedule your appointment, please contact [facility] [place] [facility] [place]  [phone] and follow the prompts.  Office hours are 8:00 a.m. to 4:30 p.m. Monday - Friday. Please note that voicemails left after 4:00 p.m. may not be returned until the following business day.  We are closed weekends and major holidays. You have access to a nurse at all times for urgent questions. Please call the main number to the clinic [phone] and follow the prompts.  For any non-urgent questions, you may also contact your provider using MyChart. We now offer e-Visits for anyone 16 and older to request care online for non-urgent symptoms. For details visit [email or web address].   Also download the MyChart app! Go to the app store, search "MyChart", open the app, select [facility], and log in with your MyChart username and password.  Masks are optional in the cancer centers. If you would like for your care team to wear a mask while they are taking care of you, please let them know. For doctor visits, patients may have with them one support person who is at least 81 years old. At this time, visitors are not allowed in the infusion area.   Bevacizumab Injection What is this medication? BEVACIZUMAB (be va SIZ yoo mab) treats some types of cancer. It works by blocking a protein that causes cancer cells to grow and multiply. This helps to slow or stop the spread of cancer cells. It is a monoclonal antibody. This medicine may be used  for other purposes; ask your health care provider or pharmacist if you have questions. COMMON BRAND NAME(S): Alymsys, Avastin, MVASI, Noah Charon What should I tell my care team before I take this medication? They need to know if you have any of these conditions: Blood clots Coughing up blood Having or recent surgery Heart failure High blood  pressure History of a connection between 2 or more body parts that do not usually connect (fistula) History of a tear in your stomach or intestines Protein in your urine An unusual or allergic reaction to bevacizumab, other medications, foods, dyes, or preservatives Pregnant or trying to get pregnant Breast-feeding How should I use this medication? This medication is injected into a vein. It is given by your care team in a hospital or clinic setting. Talk to your care team the use of this medication in children. Special care may be needed. Overdosage: If you think you have taken too much of this medicine contact a poison control center or emergency room at once. NOTE: This medicine is only for you. Do not share this medicine with others. What if I miss a dose? Keep appointments for follow-up doses. It is important not to miss your dose. Call your care team if you are unable to keep an appointment. What may interact with this medication? Interactions are not expected. This list may not describe all possible interactions. Give your health care provider a list of all the medicines, herbs, non-prescription drugs, or dietary supplements you use. Also tell them if you smoke, drink alcohol, or use illegal drugs. Some items may interact with your medicine. What should I watch for while using this medication? Your condition will be monitored carefully while you are receiving this medication. You may need blood work while taking this medication. This medication may make you feel generally unwell. This is not uncommon as chemotherapy can affect healthy cells as well as cancer cells. Report any side effects. Continue your course of treatment even though you feel ill unless your care team tells you to stop. This medication may increase your risk to bruise or bleed. Call your care team if you notice any unusual bleeding. Before having surgery, talk to your care team to make sure it is ok. This medication can  increase the risk of poor healing of your surgical site or wound. You will need to stop this medication for 28 days before surgery. After surgery, wait at least 28 days before restarting this medication. Make sure the surgical site or wound is healed enough before restarting this medication. Talk to your care team if questions. Talk to your care team if you may be pregnant. Serious birth defects can occur if you take this medication during pregnancy and for 6 months after the last dose. Contraception is recommended while taking this medication and for 6 months after the last dose. Your care team can help you find the option that works for you. Do not breastfeed while taking this medication and for 6 months after the last dose. This medication can cause infertility. Talk to your care team if you are concerned about your fertility. What side effects may I notice from receiving this medication? Side effects that you should report to your care team as soon as possible: Allergic reactions--skin rash, itching, hives, swelling of the face, lips, tongue, or throat Bleeding--bloody or black, tar-like stools, vomiting blood or brown material that looks like coffee grounds, red or dark brown urine, small red or purple spots on skin, unusual bruising or  bleeding Blood clot--pain, swelling, or warmth in the leg, shortness of breath, chest pain Heart attack--pain or tightness in the chest, shoulders, arms, or jaw, nausea, shortness of breath, cold or clammy skin, feeling faint or lightheaded Heart failure--shortness of breath, swelling of the ankles, feet, or hands, sudden weight gain, unusual weakness or fatigue Increase in blood pressure Infection--fever, chills, cough, sore throat, wounds that don't heal, pain or trouble when passing urine, general feeling of discomfort or being unwell Infusion reactions--chest pain, shortness of breath or trouble breathing, feeling faint or lightheaded Kidney injury--decrease in  the amount of urine, swelling of the ankles, hands, or feet Stomach pain that is severe, does not go away, or gets worse Stroke--sudden numbness or weakness of the face, arm, or leg, trouble speaking, confusion, trouble walking, loss of balance or coordination, dizziness, severe headache, change in vision Sudden and severe headache, confusion, change in vision, seizures, which may be signs of posterior reversible encephalopathy syndrome (PRES) Side effects that usually do not require medical attention (report to your care team if they continue or are bothersome): Back pain Change in taste Diarrhea Dry skin Increased tears Nosebleed This list may not describe all possible side effects. Call your doctor for medical advice about side effects. You may report side effects to FDA at 1-800-FDA-1088. Where should I keep my medication? This medication is given in a hospital or clinic. It will not be stored at home. NOTE: This sheet is a summary. It may not cover all possible information. If you have questions about this medicine, talk to your doctor, pharmacist, or health care provider.  2023 Elsevier/Gold Standard (2021-09-02 00:00:00)  Leucovorin Injection What is this medication? LEUCOVORIN (loo koe VOR in) prevents side effects from certain medications, such as methotrexate. It works by increasing folate levels. This helps protect healthy cells in your body. It may also be used to treat anemia caused by low levels of folate. It can also be used with fluorouracil, a type of chemotherapy, to treat colorectal cancer. It works by increasing the effects of fluorouracil in the body. This medicine may be used for other purposes; ask your health care provider or pharmacist if you have questions. What should I tell my care team before I take this medication? They need to know if you have any of these conditions: Anemia from low levels of vitamin B12 in the blood An unusual or allergic reaction to  leucovorin, folic acid, other medications, foods, dyes, or preservatives Pregnant or trying to get pregnant Breastfeeding How should I use this medication? This medication is injected into a vein or a muscle. It is given by your care team in a hospital or clinic setting. Talk to your care team about the use of this medication in children. Special care may be needed. Overdosage: If you think you have taken too much of this medicine contact a poison control center or emergency room at once. NOTE: This medicine is only for you. Do not share this medicine with others. What if I miss a dose? Keep appointments for follow-up doses. It is important not to miss your dose. Call your care team if you are unable to keep an appointment. What may interact with this medication? Capecitabine Fluorouracil Phenobarbital Phenytoin Primidone Trimethoprim;sulfamethoxazole This list may not describe all possible interactions. Give your health care provider a list of all the medicines, herbs, non-prescription drugs, or dietary supplements you use. Also tell them if you smoke, drink alcohol, or use illegal drugs. Some items may interact  with your medicine. What should I watch for while using this medication? Your condition will be monitored carefully while you are receiving this medication. This medication may increase the side effects of 5-fluorouracil. Tell your care team if you have diarrhea or mouth sores that do not get better or that get worse. What side effects may I notice from receiving this medication? Side effects that you should report to your care team as soon as possible: Allergic reactions--skin rash, itching, hives, swelling of the face, lips, tongue, or throat This list may not describe all possible side effects. Call your doctor for medical advice about side effects. You may report side effects to FDA at 1-800-FDA-1088. Where should I keep my medication? This medication is given in a hospital or  clinic. It will not be stored at home. NOTE: This sheet is a summary. It may not cover all possible information. If you have questions about this medicine, talk to your doctor, pharmacist, or health care provider.  2023 Elsevier/Gold Standard (2021-10-04 00:00:00)

## 2022-05-09 NOTE — Progress Notes (Signed)
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   CEA is relatively stable with small fluctuations. CT images were reviewed and findings were discussed. Stable disease.  Labs reviewed and discussed with patient Proceed with 5-FU/bevacizumab treatment today Continue current regimen.  Anemia due to chemotherapy Mild anemia. continue to monitor.  Hemoglobin is close to baseline.  Encounter for antineoplastic chemotherapy Chemotherapy plan as listed above.    Follow-up 2 weeks lab MD 5-FU/bevacizumab -   All questions were answered. The patient knows to call the clinic with any problems, questions or concerns.  [name], MD, PhD [facility] Hematology Oncology 05/09/2022   Chief Complaint: Jared Gutierrez is a 81 y.o. male presents for follow-up of metastatic colon cancer   PERTINENT ONCOLOGY HISTORY Jared Gutierrez is a 81 y.o.[place] who has above oncology history reviewed by me today presented for follow up visit for management of Metastatic colon cancer. [name], patient switched care to me on 11/11/20 Extensive medical record review was performed by me  Oncology History  History of colon cancer, stage I  10/02/2014 Pathology Results   [id number] colonoscopy pathology A. CECAL POLYPS; HOT SNARE AND COLD BIOPSY:  - TUBULAR ADENOMA, MULTIPLE FRAGMENTS.  - NEGATIVE FOR HIGH-GRADE DYSPLASIA AND MALIGNANCY.   B. COLON POLYP, TRANSVERSE; COLD BIOPSY:  - TUBULAR ADENOMA.  - NEGATIVE FOR HIGH-GRADE DYSPLASIA AND MALIGNANCY.     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  Tumor extended into the submucosa. Margins were negative. + lymphovascular invasion. 14 lymph nodes were negative. Pathologic stage was T1 N0.  TRANSVERSE COLON, RESECTION:  - INVASIVE ADENOCARCINOMA ARISING IN A 4.6 CM TUBULOVILLOUS  ADENOMA WITH HIGH GRADE DYSPLASIA (MALIGNANT POLYP).  - SEE SUMMARY BELOW.  [name]  ONCOLOGY SUMMARY: COLON AND RECTUM, RESECTION, AJCC 7TH EDITION  Specimen: transverse colon  Procedure: transverse colon resection  Tumor site: splenic flexure  Tumor size: 1.0 cm (invasive component)  Macroscopic tumor perforation: not specified  Histologic type: invasive adenocarcinoma  Histologic grade: moderately differentiated  Microscopic tumor extension: into submucosa  Margins:       Proximal margin: negative       Distal margin: negative       Circumferential (radial) or mesenteric margin: negative       If all margins uninvolved by invasive carcinoma:       Distance of invasive carcinoma from closest margin:  7.5 cm  to distal margin  Treatment effect: not applicable  Lymph-vascular invasion: present  Perineural invasion: not identified  Tumor deposits (discontinuous extramural extension): not  identified  Pathologic staging:       Primary tumor: pT1        Regional lymph nodes: pN0            Number of nodes examined: 14            Number of nodes involved: 0    04/02/2019 Progression   04/02/2019  PET scan  limited evealed a 2.4 x 2.3 cm (SUV 11) hypermetabolic soft tissue density caudal and anterior to the pancreatic neck favored to represent isolated peritoneal or nodal metastasis in the setting of prior transverse colonic resection (expected primary drainage). Although this was immediately adjacent to the pancreas, a fat plane was maintained, arguing strongly against a pancreatic primary. Otherwise, there was no evidence of hypermetabolic metastasis. 04/17/2019 EUS on  revealed a normal esophagus, stomach, duodenum, and pancreas.  There was a 2.4 x 2.4 cm irregular mass in the retroperitoneum adjacent to, but not involving the pancreatic neck.  FNA and core needle biopsy were performed.  Pathology revealed adenocarcinoma compatible with a metastatic lesion of colorectal origin.  Tumor cells were positive for CK20 and CDX2 and negative for CK7.  NGS: [employer] on 05/15/2019 revealed + [id number] and TP53.  Negative results included BRAF V600E, Her2, NRAS, NTRK, PD-L1 (<1%), and TMB 8.7/Mb (intermediate).  MMR testing from his colon resection on 10/16/2013 was intact with a low probability of MSI-H.   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  He received Neulasta after cycle #4 and #5 secondary to progressive leukopenia.  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  He received a truncated course of FOLFOX with cycle #11 secondary to oxaliplatin reaction.   01/14/2020 Imaging    PET revealed an interval decrease in size and FDG uptake (2.4 x 2.3 cm with SUV 11 to 2.4 x 1.7 cm with SUV 4.09) associated with the previously referenced soft tissue density caudal and anterior to the pancreatic neck  suggesting treatment response. There were no new sites of FDG avid tumor.   08/31/2020 Imaging   08/31/2020 Abdomen and pelvis CT revealed increased size of the index soft tissue lesion inferior and anterior to the pancreatic neck (2.9 x 1.8 cm to 3.5 x 2.9 cm). There were similar prominent retroperitoneal lymph nodes without adenopathy by size criteria. There was no new or enlarging abdominal or pelvic lymph nodes. There were no new interval findings. There was similar circumferential wall thickening of a nondistended urinary bladder, which likely accentuated wall thickening There was hepatic steatosis and aortic atherosclerosis.   11/03/2020, PET showed recurrent peritoneal metastasis in the upper abdomen adjacent to the pancreas.  The lesion is 3.8 x 2.9 cm with SUV of 11.3. No evidence of metastatic peritoneal disease elsewhere in the abdomen pelvis.  No evidence of distant metastasis.   11/03/2020 Imaging   PET scan showed recurrent peritoneal metastasis near pancreas. Given that he has no other distant metastasis. Discussed with radiation oncology. Repeat SBRT may be considered if he does not respond well to systemic chemotherapy.     11/26/2020 -  Chemotherapy   5-FU and bevacizumab.   02/17/2021 Imaging   02/17/2021 CT abdomen pelvis showed partial response.  Mild decrease of peritoneal lesion.  Proceed with 5-FU and bevacizumab today.  Given that he is tolerating current regimen with good life quality, partial response.  Shared decision was made to hold off adding additional chemotherapy agents.   06/08/2021, CT chest abdomen pelvis with contrast showed soft tissue mass at the base of mesentery inferior to the pancreatic neck is unchanged in size.  No progressive disease was identified.   09/07/2021, CT chest abdomen pelvis with contrast showed no substantial changes in size of the mesenteric mass, 2.6 cm.  No suspicious new lesions.  Chronic findings as detailed in the imaging report.    12/05/2021 Imaging   PET scan showed soft tissue mesenteric mass is unchanged in size.  Decreased FDG uptake compared to activity on November 03, 2020.  Fever treated disease.  No new metastatic disease.   03/21/2022 Imaging   CT chest abdomen pelvis  1. Similar size of the peripancreatic presumed peritoneal implant compared to 09/07/2021. 2. No new or progressive disease. 3. Incidental findings, including: Coronary artery atherosclerosis.Aortic Atherosclerosis (ICD10-I70.0). Prostatomegaly with chronic bladder wall thickening, suggesting outlet obstruction.        Other medical problems Chronic lower extremity edema. 12/24/2018, right lower extremity duplex negative for DVT.  Small right Baker's cyst. 08/16/2019, bilateral lower extremity duplex showed no DVT. 08/16/2019 - 08/17/2019 with right lower extremity cellulitis.  He was unable to bear weight.  He was treated with IV fluids, NSAIDs, colchicine, and broad antibiotics (vancomycin and Cefepime).  He was discharged on indomethacin x 5 days and Keflex 500 mg TID x 5 days.  10/05/2020, colonoscopy showed internal hemorrhoids.  Otherwise normal examination.  INTERVAL HISTORY Jared Gutierrez is a 81 y.o. male who has above history reviewed by me today presents for follow up visit for management of recurrent metastatic colon cancer Patient was accompanied by wife.   Denies fever, chills, nausea, vomiting, diarrhea, chest pain, shortness of breath, abdominal pain, urinary symptoms.  + no additional episodes of blood in stool.  Patient need 4 teeth extraction in the near future.   Review of Systems  Constitutional:  Negative for appetite change, chills, diaphoresis, fever and unexpected weight change.  HENT:   Negative for hearing loss, nosebleeds, sore throat and tinnitus.   Respiratory:  Negative for cough, hemoptysis and shortness of breath.   Cardiovascular:  Negative for chest pain and palpitations.  Gastrointestinal:  Negative for  abdominal pain, blood in stool, constipation, diarrhea, nausea and vomiting.  Genitourinary:  Negative for dysuria, frequency and hematuria.   Musculoskeletal:  Positive for arthralgias. Negative for back pain, myalgias and neck pain.  Skin:  Negative for itching and rash.  Neurological:  Positive for numbness. Negative for dizziness and headaches.  Hematological:  Does not bruise/bleed easily.  Psychiatric/Behavioral:  Negative for depression. The patient is not nervous/anxious.       Past Medical History:  Diagnosis Date   Arthritis    OSTEOARTHRITIS   Cancer ([place])    Cavitary lesion of lung    RIGHT LOWER LOBE   Chicken pox    Colon cancer ([place])    History of kidney stones    Hypertension    Lipoma of colon    Nephrolithiasis    Nephrolithiasis    Obesity    Shingles    Tubular adenoma of colon    multiple fragments    Past Surgical History:  Procedure Laterality Date   COLON SURGERY     COLONOSCOPY N/A 10/02/2014  Procedure: COLONOSCOPY;  Surgeon: Josefine Class, MD;  Location: [facility] ENDOSCOPY;  Service: Endoscopy;  Laterality: N/A;   COLONOSCOPY WITH PROPOFOL N/A 02/16/2017   Procedure: COLONOSCOPY WITH PROPOFOL;  Surgeon: Manya Silvas, MD;  Location: [facility] ENDOSCOPY;  Service: Endoscopy;  Laterality: N/A;   COLONOSCOPY WITH PROPOFOL N/A 10/05/2020   Procedure: COLONOSCOPY WITH PROPOFOL;  Surgeon: Lesly Rubenstein, MD;  Location: [facility] ENDOSCOPY;  Service: Endoscopy;  Laterality: N/A;   EUS N/A 04/17/2019   Procedure: FULL UPPER ENDOSCOPIC ULTRASOUND (EUS) RADIAL;  Surgeon: Holly Bodily, MD;  Location: [facility] ENDOSCOPY;  Service: Gastroenterology;  Laterality: N/A;   KIDNEY STONE SURGERY     PARTIAL COLECTOMY  10/17/2013   PORTACATH PLACEMENT Right 06/13/2019   Procedure: INSERTION PORT-A-CATH;  Surgeon: Benjamine Sprague, DO;  Location: [facility] ORS;  Service: General;  Laterality: Right;    Family History  Problem Relation Age of Onset   Cancer Mother     Breast cancer Mother    COPD Father     Social History:  reports that he has never smoked. He has never used smokeless tobacco. He reports current alcohol use. He reports that he does not use drugs.    Allergies: No Known Allergies  Current Medications: Current Outpatient Medications  Medication Sig Dispense Refill   acetaminophen (TYLENOL) 500 MG tablet Take 500 mg by mouth every 6 (six) hours as needed.     amLODipine (NORVASC) 2.5 MG tablet Take 2.5 mg by mouth daily.     aspirin EC 81 MG tablet Take 81 mg by mouth daily.     Calcium Carbonate (CALCIUM 600 PO) Take 1 tablet by mouth 2 (two) times daily.     Cholecalciferol (VITAMIN D) 125 MCG (5000 UT) CAPS Take 5,000 mg by mouth.     docusate sodium (COLACE) 100 MG capsule Take 1 capsule (100 mg total) by mouth 2 (two) times daily as needed for mild constipation or moderate constipation. 60 capsule 3   HYDROcodone-acetaminophen (NORCO/VICODIN) 5-325 MG tablet Take 1 tablet by mouth every 6 (six) hours as needed for moderate pain (up to 3 doses for moderate pain.).     lidocaine-prilocaine (EMLA) cream APPLY TO AFFECTED AREA ONCE AS DIRECTED 30 g 3   loperamide (IMODIUM) 2 MG capsule Take 1 capsule (2 mg total) by mouth See admin instructions. Take 2 tablets after first loose stool,  then 1 tablet  after each loose stool; maximum: 8 tablets /day 60 capsule 0   loratadine (CLARITIN) 10 MG tablet Take 10 mg by mouth daily.     olmesartan (BENICAR) 20 MG tablet Take 1 tablet by mouth daily.     ondansetron (ZOFRAN) 8 MG tablet Take 1 tablet (8 mg total) by mouth 2 (two) times daily as needed for refractory nausea / vomiting. Start on day 3 after chemotherapy. 60 tablet 1   potassium chloride SA (KLOR-CON M20) 20 MEQ tablet Take 1 tablet (20 mEq total) by mouth daily. 180 tablet 1   Probiotic Product (PROBIOTIC DAILY PO) Take 1 tablet by mouth daily.     prochlorperazine (COMPAZINE) 10 MG tablet Take 1 tablet (10 mg total) by mouth every 6  (six) hours as needed (NAUSEA). 30 tablet 1   senna-docusate (SENNA S) 8.6-50 MG tablet Take 2 tablets by mouth daily. 60 tablet 3   No current facility-administered medications for this visit.   Facility-Administered Medications Ordered in Other Visits  Medication Dose Route Frequency Provider Last Rate Last Admin   0.9 %  sodium chloride infusion   Intravenous Once Corcoran, Melissa C, MD       0.9 %  sodium chloride infusion   Intravenous Continuous Lequita Asal, MD 10 mL/hr at 12/31/19 1000 New Bag at 10/05/20 1045   0.9 %  sodium chloride infusion   Intravenous Once [name], MD       fluorouracil (ADRUCIL) 5,500 mg in sodium chloride 0.9 % 140 mL chemo infusion  2,400 mg/m2 (Treatment Plan Recorded) Intravenous 1 day or 1 dose [name], MD   Infusion Verify at 05/09/22 1142   heparin lock flush 100 unit/mL  500 Units Intravenous Once Lequita Asal, MD        Performance status (ECOG): 1  Vitals Blood pressure 112/73, pulse 75, temperature 98.1 F (36.7 C), temperature source Tympanic, weight 238 lb 14.4 oz (108.4 kg), SpO2 100 %.   Physical Exam Vitals and nursing note reviewed.  Constitutional:      General: He is not in acute distress.    Appearance: He is well-developed. He is obese. He is not diaphoretic.     Interventions: Face mask in place.  HENT:     Head: Normocephalic and atraumatic.  Eyes:     General: No scleral icterus.    Extraocular Movements: Extraocular movements intact.     Conjunctiva/sclera: Conjunctivae normal.     Pupils: Pupils are equal, round, and reactive to light.  Cardiovascular:     Rate and Rhythm: Normal rate and regular rhythm.     Heart sounds: Normal heart sounds. No murmur heard. Pulmonary:     Effort: Pulmonary effort is normal. No respiratory distress.     Breath sounds: No wheezing.     Comments: Decreased breath sound bilaterally Abdominal:     General: There is no distension.     Palpations: Abdomen is soft. There is  no mass.     Tenderness: There is no abdominal tenderness.  Musculoskeletal:        General: No swelling or tenderness. Normal range of motion.     Cervical back: Normal range of motion and neck supple.     Comments: Trace edema bilaterally.   Lymphadenopathy:     Head:     Right side of head: No preauricular, posterior auricular or occipital adenopathy.     Left side of head: No preauricular, posterior auricular or occipital adenopathy.     Cervical: No cervical adenopathy.     Upper Body:     Right upper body: No supraclavicular or axillary adenopathy.     Left upper body: No supraclavicular or axillary adenopathy.     Lower Body: No right inguinal adenopathy. No left inguinal adenopathy.  Skin:    General: Skin is warm and dry.  Neurological:     Mental Status: He is alert and oriented to person, place, and time. Mental status is at baseline.  Psychiatric:        Mood and Affect: Mood normal.    Labs were reviewed by me.     Latest Ref Rng & Units 05/09/2022    8:41 AM 04/25/2022    8:39 AM 04/11/2022    8:17 AM  CBC  WBC 4.0 - 10.5 K/uL 4.0  3.7  4.5   Hemoglobin 13.0 - 17.0 g/dL 11.5  11.7  11.8   Hematocrit 39.0 - 52.0 % 34.9  36.2  35.5   Platelets 150 - 400 K/uL 166  204  161       Latest  Ref Rng & Units 05/09/2022    8:41 AM 04/25/2022    8:39 AM 04/11/2022    8:17 AM  CMP  Glucose 70 - 99 mg/dL 124  145  98   BUN 8 - 23 mg/dL _0 Creatinine 0.61 - 1.24 mg/dL 0.71  0.78  0.66   Sodium 135 - 145 mmol/L 134  136  135   Potassium 3.5 - 5.1 mmol/L 4.1  3.7  4.1   Chloride 98 - 111 mmol/L 106  102  108   CO2 22 - 32 mmol/L _1 Calcium 8.9 - 10.3 mg/dL 9.4  9.4  9.4   Total Protein 6.5 - 8.1 g/dL 6.6  6.9  6.6   Total Bilirubin 0.3 - 1.2 mg/dL 1.0  0.9  0.8   Alkaline Phos 38 - 126 U/L 38  37  35   AST 15 - 41 U/L 31  35  38   ALT 0 - 44 U/L 14  15  21

## 2022-05-09 NOTE — Assessment & Plan Note (Signed)
Mild anemia. continue to monitor.  Hemoglobin is close to baseline. 

## 2022-05-10 LAB — CEA: CEA: 15 ng/mL — ABNORMAL HIGH (ref 0.0–4.7)

## 2022-05-11 ENCOUNTER — Other Ambulatory Visit: Payer: Self-pay

## 2022-05-11 ENCOUNTER — Inpatient Hospital Stay: Payer: Medicare Other

## 2022-05-11 VITALS — BP 160/73 | HR 78 | Resp 18

## 2022-05-11 DIAGNOSIS — C786 Secondary malignant neoplasm of retroperitoneum and peritoneum: Secondary | ICD-10-CM

## 2022-05-11 DIAGNOSIS — Z85038 Personal history of other malignant neoplasm of large intestine: Secondary | ICD-10-CM

## 2022-05-11 DIAGNOSIS — Z5112 Encounter for antineoplastic immunotherapy: Secondary | ICD-10-CM | POA: Diagnosis not present

## 2022-05-11 MED ORDER — SODIUM CHLORIDE 0.9% FLUSH
10.0000 mL | INTRAVENOUS | Status: DC | PRN
  Administered 2022-05-11: 10 mL
  Filled 2022-05-11: qty 10

## 2022-05-11 MED ORDER — HEPARIN SOD (PORK) LOCK FLUSH 100 UNIT/ML IV SOLN
500.0000 [IU] | Freq: Once | INTRAVENOUS | Status: AC | PRN
  Administered 2022-05-11: 500 [IU]
  Filled 2022-05-11: qty 5

## 2022-05-22 MED FILL — Dexamethasone Sodium Phosphate Inj 100 MG/10ML: INTRAMUSCULAR | Qty: 1 | Status: AC

## 2022-05-23 ENCOUNTER — Inpatient Hospital Stay (HOSPITAL_BASED_OUTPATIENT_CLINIC_OR_DEPARTMENT_OTHER): Payer: Medicare Other | Admitting: Oncology

## 2022-05-23 ENCOUNTER — Inpatient Hospital Stay: Payer: Medicare Other

## 2022-05-23 ENCOUNTER — Inpatient Hospital Stay: Payer: Medicare Other | Attending: Oncology

## 2022-05-23 ENCOUNTER — Encounter: Payer: Self-pay | Admitting: Oncology

## 2022-05-23 VITALS — BP 98/70 | HR 82 | Temp 97.0°F | Wt 240.9 lb

## 2022-05-23 DIAGNOSIS — Z5112 Encounter for antineoplastic immunotherapy: Secondary | ICD-10-CM | POA: Diagnosis present

## 2022-05-23 DIAGNOSIS — C786 Secondary malignant neoplasm of retroperitoneum and peritoneum: Secondary | ICD-10-CM | POA: Diagnosis not present

## 2022-05-23 DIAGNOSIS — C184 Malignant neoplasm of transverse colon: Secondary | ICD-10-CM | POA: Insufficient documentation

## 2022-05-23 DIAGNOSIS — D6481 Anemia due to antineoplastic chemotherapy: Secondary | ICD-10-CM | POA: Diagnosis not present

## 2022-05-23 DIAGNOSIS — Z5111 Encounter for antineoplastic chemotherapy: Secondary | ICD-10-CM | POA: Diagnosis present

## 2022-05-23 DIAGNOSIS — Z809 Family history of malignant neoplasm, unspecified: Secondary | ICD-10-CM | POA: Diagnosis not present

## 2022-05-23 DIAGNOSIS — M7121 Synovial cyst of popliteal space [Baker], right knee: Secondary | ICD-10-CM | POA: Insufficient documentation

## 2022-05-23 DIAGNOSIS — T451X5D Adverse effect of antineoplastic and immunosuppressive drugs, subsequent encounter: Secondary | ICD-10-CM | POA: Diagnosis not present

## 2022-05-23 DIAGNOSIS — Z836 Family history of other diseases of the respiratory system: Secondary | ICD-10-CM | POA: Diagnosis not present

## 2022-05-23 DIAGNOSIS — Z85038 Personal history of other malignant neoplasm of large intestine: Secondary | ICD-10-CM | POA: Diagnosis not present

## 2022-05-23 DIAGNOSIS — T451X5A Adverse effect of antineoplastic and immunosuppressive drugs, initial encounter: Secondary | ICD-10-CM

## 2022-05-23 DIAGNOSIS — Z86010 Personal history of colonic polyps: Secondary | ICD-10-CM | POA: Diagnosis not present

## 2022-05-23 DIAGNOSIS — Z452 Encounter for adjustment and management of vascular access device: Secondary | ICD-10-CM | POA: Diagnosis not present

## 2022-05-23 DIAGNOSIS — Z803 Family history of malignant neoplasm of breast: Secondary | ICD-10-CM | POA: Insufficient documentation

## 2022-05-23 LAB — CBC WITH DIFFERENTIAL/PLATELET
Abs Immature Granulocytes: 0.02 10*3/uL (ref 0.00–0.07)
Basophils Absolute: 0 10*3/uL (ref 0.0–0.1)
Basophils Relative: 1 %
Eosinophils Absolute: 0.1 10*3/uL (ref 0.0–0.5)
Eosinophils Relative: 1 %
HCT: 35.2 % — ABNORMAL LOW (ref 39.0–52.0)
Hemoglobin: 11.7 g/dL — ABNORMAL LOW (ref 13.0–17.0)
Immature Granulocytes: 0 %
Lymphocytes Relative: 30 %
Lymphs Abs: 1.8 10*3/uL (ref 0.7–4.0)
MCH: 30.5 pg (ref 26.0–34.0)
MCHC: 33.2 g/dL (ref 30.0–36.0)
MCV: 91.7 fL (ref 80.0–100.0)
Monocytes Absolute: 0.6 10*3/uL (ref 0.1–1.0)
Monocytes Relative: 10 %
Neutro Abs: 3.4 10*3/uL (ref 1.7–7.7)
Neutrophils Relative %: 58 %
Platelets: 153 10*3/uL (ref 150–400)
RBC: 3.84 MIL/uL — ABNORMAL LOW (ref 4.22–5.81)
RDW: 15.6 % — ABNORMAL HIGH (ref 11.5–15.5)
WBC: 5.9 10*3/uL (ref 4.0–10.5)
nRBC: 0 % (ref 0.0–0.2)

## 2022-05-23 LAB — COMPREHENSIVE METABOLIC PANEL
ALT: 18 U/L (ref 0–44)
AST: 38 U/L (ref 15–41)
Albumin: 3.6 g/dL (ref 3.5–5.0)
Alkaline Phosphatase: 43 U/L (ref 38–126)
Anion gap: 9 (ref 5–15)
BUN: 11 mg/dL (ref 8–23)
CO2: 23 mmol/L (ref 22–32)
Calcium: 9 mg/dL (ref 8.9–10.3)
Chloride: 104 mmol/L (ref 98–111)
Creatinine, Ser: 0.8 mg/dL (ref 0.61–1.24)
GFR, Estimated: >60 mL/min (ref >60)
Glucose, Bld: 107 mg/dL — ABNORMAL HIGH (ref 70–99)
Potassium: 3.8 mmol/L (ref 3.5–5.1)
Sodium: 136 mmol/L (ref 135–145)
Total Bilirubin: 1.1 mg/dL (ref 0.3–1.2)
Total Protein: 6.4 g/dL — ABNORMAL LOW (ref 6.5–8.1)

## 2022-05-23 LAB — PROTEIN, URINE, RANDOM: Total Protein, Urine: 32 mg/dL

## 2022-05-23 MED ORDER — SODIUM CHLORIDE 0.9 % IV SOLN
5.0000 mg/kg | Freq: Once | INTRAVENOUS | Status: AC
  Administered 2022-05-23: 500 mg via INTRAVENOUS
  Filled 2022-05-23: qty 16

## 2022-05-23 MED ORDER — SODIUM CHLORIDE 0.9 % IV SOLN
INTRAVENOUS | Status: DC
  Filled 2022-05-23 (×2): qty 250

## 2022-05-23 MED ORDER — SODIUM CHLORIDE 0.9 % IV SOLN
2400.0000 mg/m2 | INTRAVENOUS | Status: DC
  Administered 2022-05-23: 5500 mg via INTRAVENOUS
  Filled 2022-05-23: qty 110

## 2022-05-23 MED ORDER — SODIUM CHLORIDE 0.9 % IV SOLN
10.0000 mg | Freq: Once | INTRAVENOUS | Status: AC
  Administered 2022-05-23: 10 mg via INTRAVENOUS
  Filled 2022-05-23: qty 10

## 2022-05-23 MED ORDER — SODIUM CHLORIDE 0.9 % IV SOLN
Freq: Once | INTRAVENOUS | Status: AC
  Filled 2022-05-23: qty 250

## 2022-05-23 MED ORDER — SODIUM CHLORIDE 0.9 % IV SOLN
400.0000 mg/m2 | Freq: Once | INTRAVENOUS | Status: AC
  Administered 2022-05-23: 916 mg via INTRAVENOUS
  Filled 2022-05-23: qty 45.8

## 2022-05-23 MED ORDER — FLUOROURACIL CHEMO INJECTION 2.5 GM/50ML
400.0000 mg/m2 | Freq: Once | INTRAVENOUS | Status: AC
  Administered 2022-05-23: 900 mg via INTRAVENOUS
  Filled 2022-05-23: qty 18

## 2022-05-23 NOTE — Assessment & Plan Note (Addendum)
Overall he tolerates chemotherapy.   CEA is relatively stable with small fluctuations. CT images were reviewed and findings were discussed. Stable disease.  Labs reviewed and discussed with patient Proceed with 5-FU/bevacizumab treatment today Continue current regimen.  BP is slightly low today, IVF 526m x 1 today

## 2022-05-23 NOTE — Assessment & Plan Note (Signed)
Mild anemia. continue to monitor.  Hemoglobin is close to baseline. 

## 2022-05-23 NOTE — Progress Notes (Signed)
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   CEA is relatively stable with small fluctuations. CT images were reviewed and findings were discussed. Stable disease.  Labs reviewed and discussed with patient Proceed with 5-FU/bevacizumab treatment today Continue current regimen.  BP is slightly low today, IVF 57m x 1 today  Encounter for antineoplastic chemotherapy Chemotherapy plan as listed above.   Anemia due to chemotherapy Mild anemia. continue to monitor.  Hemoglobin is close to baseline.   Follow-up 2 weeks lab MD 5-FU/bevacizumab -   All questions were answered. The patient knows to call the clinic with any problems, questions or concerns.  ZEarlie Server MD, PhD [facility] 05/23/2022   Chief Complaint: Jared HELLWIGis a 82y.o. male presents for follow-up of metastatic colon cancer   PERTINENT ONCOLOGY HISTORY WMOHAMADOU MACIVERis a 82y.o.[place] who has above oncology history reviewed by me today presented for follow up visit for management of Metastatic colon cancer. [name], patient switched care to me on 11/11/20 Extensive medical record review was performed by me  Oncology History  History of colon cancer, stage I  10/02/2014 Pathology Results   [id number] colonoscopy pathology A. CECAL POLYPS; HOT SNARE AND COLD BIOPSY:  - TUBULAR ADENOMA, MULTIPLE FRAGMENTS.  - NEGATIVE FOR HIGH-GRADE DYSPLASIA AND MALIGNANCY.   B. COLON POLYP, TRANSVERSE; COLD BIOPSY:  - TUBULAR ADENOMA.  - NEGATIVE FOR HIGH-GRADE DYSPLASIA AND MALIGNANCY.     11/16/2020 -  Chemotherapy   Patient is on Treatment Plan  : COLORECTAL 5-FU + Bevacizumab q14d     11/26/2020 - 01/03/2022 Chemotherapy   [name]     [place]  11/16/2020 -  Chemotherapy   [name]     11/26/2020 - 01/03/2022 Chemotherapy   [name]     [place]  09/[place] Initial Diagnosis   His initial cancer was discovered through screening colonoscopy   10/17/2013 Pathology Results   stage I colon cancer s/p transverse colectomy- Pathology revealed a 1 cm moderately differentiated invasive adenocarcinoma arising in a 4.6 cm tubulovillous adenoma with high-grade dysplasia.  Tumor extended into the submucosa. Margins were negative. + lymphovascular invasion. 14 lymph nodes were negative. Pathologic stage was T1 N0.  TRANSVERSE COLON, RESECTION:  - INVASIVE ADENOCARCINOMA ARISING IN A 4.6 CM TUBULOVILLOUS  ADENOMA WITH HIGH GRADE DYSPLASIA (MALIGNANT POLYP).  - SEE SUMMARY BELOW.  .[name] ONCOLOGY SUMMARY: COLON AND RECTUM, RESECTION, AJCC 7TH EDITION  Specimen: transverse colon  Procedure: transverse colon resection  Tumor site: splenic flexure  Tumor size: 1.0 cm (invasive component)  Macroscopic tumor perforation: not specified  Histologic type: invasive adenocarcinoma  Histologic grade: moderately differentiated  Microscopic tumor extension: into submucosa  Margins:       Proximal margin: negative       Distal margin: negative       Circumferential (radial) or mesenteric margin: negative       If all margins uninvolved by invasive carcinoma:  Distance of invasive carcinoma from closest margin: 7.5 cm  to distal margin  Treatment effect: not applicable  Lymph-vascular invasion: present  Perineural invasion: not identified  Tumor deposits (discontinuous extramural extension): not  identified  Pathologic  staging:       Primary tumor: pT1       Regional lymph nodes: pN0            Number of nodes examined: 14            Number of nodes involved: 0    04/02/2019 Progression   04/02/2019  PET scan  limited evealed a 2.4 x 2.3 cm (SUV 11) hypermetabolic soft tissue density caudal and anterior to the pancreatic neck favored to represent isolated peritoneal or nodal metastasis in the setting of prior transverse colonic resection (expected primary drainage). Although this was immediately adjacent to the pancreas, a fat plane was maintained, arguing strongly against a pancreatic primary. Otherwise, there was no evidence of hypermetabolic metastasis. 04/17/2019 EUS on  revealed a normal esophagus, stomach, duodenum, and pancreas.  There was a 2.4 x 2.4 cm irregular mass in the retroperitoneum adjacent to, but not involving the pancreatic neck.  FNA and core needle biopsy were performed.  Pathology revealed adenocarcinoma compatible with a metastatic lesion of colorectal origin.  Tumor cells were positive for CK20 and CDX2 and negative for CK7.  NGS: [employer] on 05/15/2019 revealed + [id number] and TP53.  Negative results included BRAF V600E, Her2, NRAS, NTRK, PD-L1 (<1%), and TMB 8.7/Mb (intermediate).  MMR testing from his colon resection on 10/16/2013 was intact with a low probability of MSI-H.   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  He received Neulasta after cycle #4 and #5 secondary to progressive leukopenia.  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  He received a truncated course of FOLFOX with cycle #11 secondary to oxaliplatin reaction.   01/14/2020 Imaging    PET revealed an interval decrease in size and FDG uptake (2.4 x 2.3 cm with SUV 11 to 2.4 x 1.7 cm with SUV 4.09) associated with the previously referenced soft tissue density caudal  and anterior to the pancreatic neck suggesting treatment response. There were no new sites of FDG avid tumor.   08/31/2020 Imaging   08/31/2020 Abdomen and pelvis CT revealed increased size of the index soft tissue lesion inferior and anterior to the pancreatic neck (2.9 x 1.8 cm to 3.5 x 2.9 cm). There were similar prominent retroperitoneal lymph nodes without adenopathy by size criteria. There was no new or enlarging abdominal or pelvic lymph nodes. There were no new interval findings. There was similar circumferential wall thickening of a nondistended urinary bladder, which likely accentuated wall thickening There was hepatic steatosis and aortic atherosclerosis.   11/03/2020, PET showed recurrent peritoneal metastasis in the upper abdomen adjacent to the pancreas.  The lesion is 3.8 x 2.9 cm with SUV of 11.3. No evidence of metastatic peritoneal disease elsewhere in the abdomen pelvis.  No evidence of distant metastasis.   11/03/2020 Imaging   PET scan showed recurrent peritoneal metastasis near pancreas. Given that he has no other distant metastasis. Discussed with radiation oncology. Repeat SBRT may be considered if he does not respond well to systemic chemotherapy.     11/26/2020 -  Chemotherapy   5-FU and bevacizumab.   02/17/2021 Imaging  02/17/2021 CT abdomen pelvis showed partial response. Mild decrease of peritoneal lesion.  Proceed with 5-FU and bevacizumab today.  Given that he is tolerating current regimen with good life quality, partial response.  Shared decision was made to hold off adding additional chemotherapy agents.   06/08/2021, CT chest abdomen pelvis with contrast showed soft tissue mass at the base of mesentery inferior to the pancreatic neck is unchanged in size.  No progressive disease was identified.   09/07/2021, CT chest abdomen pelvis with contrast showed no substantial changes in size of the mesenteric mass, 2.6 cm.  No suspicious new lesions.  Chronic findings as  detailed in the imaging report.   12/05/2021 Imaging   PET scan showed soft tissue mesenteric mass is unchanged in size.  Decreased FDG uptake compared to activity on November 03, 2020.  Fever treated disease.  No new metastatic disease.   03/21/2022 Imaging   CT chest abdomen pelvis  1. Similar size of the peripancreatic presumed peritoneal implant compared to 09/07/2021. 2. No new or progressive disease. 3. Incidental findings, including: Coronary artery atherosclerosis.Aortic Atherosclerosis (ICD10-I70.0). Prostatomegaly with chronic bladder wall thickening, suggesting outlet obstruction.        Other medical problems Chronic lower extremity edema. 12/24/2018, right lower extremity duplex negative for DVT.  Small right Baker's cyst. 08/16/2019, bilateral lower extremity duplex showed no DVT. 08/16/2019 - 08/17/2019 with right lower extremity cellulitis.  He was unable to bear weight.  He was treated with IV fluids, NSAIDs, colchicine, and broad antibiotics (vancomycin and Cefepime).  He was discharged on indomethacin x 5 days and Keflex 500 mg TID x 5 days.  10/05/2020, colonoscopy showed internal hemorrhoids.  Otherwise normal examination.  INTERVAL HISTORY Jared Gutierrez is a 82 y.o. male who has above history reviewed by me today presents for follow up visit for management of recurrent metastatic colon cancer Patient was accompanied by daughter.   Denies fever, chills, nausea, vomiting, diarrhea, chest pain, shortness of breath, abdominal pain, urinary symptoms.  He has no new complains today.    Review of Systems  Constitutional:  Negative for appetite change, chills, diaphoresis, fever and unexpected weight change.  HENT:   Negative for hearing loss, nosebleeds, sore throat and tinnitus.   Respiratory:  Negative for cough, hemoptysis and shortness of breath.   Cardiovascular:  Negative for chest pain and palpitations.  Gastrointestinal:  Negative for abdominal pain, blood in stool,  constipation, diarrhea, nausea and vomiting.  Genitourinary:  Negative for dysuria, frequency and hematuria.   Musculoskeletal:  Positive for arthralgias. Negative for back pain, myalgias and neck pain.  Skin:  Negative for itching and rash.  Neurological:  Positive for numbness. Negative for dizziness and headaches.  Hematological:  Does not bruise/bleed easily.  Psychiatric/Behavioral:  Negative for depression. The patient is not nervous/anxious.       Past Medical History:  Diagnosis Date   Arthritis    OSTEOARTHRITIS   Cancer ([place])    Cavitary lesion of lung    RIGHT LOWER LOBE   Chicken pox    Colon cancer ([place])    History of kidney stones    Hypertension    Lipoma of colon    Nephrolithiasis    Nephrolithiasis    Obesity    Shingles    Tubular adenoma of colon    multiple fragments    Past Surgical History:  Procedure Laterality Date   COLON SURGERY     COLONOSCOPY N/A 10/02/2014   Procedure: COLONOSCOPY;  Surgeon:  Josefine Class, MD;  Location: [facility] ENDOSCOPY;  Service: Endoscopy;  Laterality: N/A;   COLONOSCOPY WITH PROPOFOL N/A 02/16/2017   Procedure: COLONOSCOPY WITH PROPOFOL;  Surgeon: Manya Silvas, MD;  Location: [facility] ENDOSCOPY;  Service: Endoscopy;  Laterality: N/A;   COLONOSCOPY WITH PROPOFOL N/A 10/05/2020   Procedure: COLONOSCOPY WITH PROPOFOL;  Surgeon: Lesly Rubenstein, MD;  Location: [facility] ENDOSCOPY;  Service: Endoscopy;  Laterality: N/A;   EUS N/A 04/17/2019   Procedure: FULL UPPER ENDOSCOPIC ULTRASOUND (EUS) RADIAL;  Surgeon: Holly Bodily, MD;  Location: [facility] ENDOSCOPY;  Service: Gastroenterology;  Laterality: N/A;   KIDNEY STONE SURGERY     PARTIAL COLECTOMY  10/17/2013   PORTACATH PLACEMENT Right 06/13/2019   Procedure: INSERTION PORT-A-CATH;  Surgeon: Benjamine Sprague, DO;  Location: [facility] ORS;  Service: General;  Laterality: Right;    Family History  Problem Relation Age of Onset   Cancer Mother    Breast cancer Mother    COPD  Father     Social History:  reports that he has never smoked. He has never used smokeless tobacco. He reports current alcohol use. He reports that he does not use drugs.    Allergies: No Known Allergies  Current Medications: Current Outpatient Medications  Medication Sig Dispense Refill   acetaminophen (TYLENOL) 500 MG tablet Take 500 mg by mouth every 6 (six) hours as needed.     amLODipine (NORVASC) 2.5 MG tablet Take 2.5 mg by mouth daily.     aspirin EC 81 MG tablet Take 81 mg by mouth daily.     Calcium Carbonate (CALCIUM 600 PO) Take 1 tablet by mouth 2 (two) times daily.     Cholecalciferol (VITAMIN D) 125 MCG (5000 UT) CAPS Take 5,000 mg by mouth.     docusate sodium (COLACE) 100 MG capsule Take 1 capsule (100 mg total) by mouth 2 (two) times daily as needed for mild constipation or moderate constipation. 60 capsule 3   HYDROcodone-acetaminophen (NORCO/VICODIN) 5-325 MG tablet Take 1 tablet by mouth every 6 (six) hours as needed for moderate pain (up to 3 doses for moderate pain.).     lidocaine-prilocaine (EMLA) cream APPLY TO AFFECTED AREA ONCE AS DIRECTED 30 g 3   loperamide (IMODIUM) 2 MG capsule Take 1 capsule (2 mg total) by mouth See admin instructions. Take 2 tablets after first loose stool,  then 1 tablet  after each loose stool; maximum: 8 tablets /day 60 capsule 0   loratadine (CLARITIN) 10 MG tablet Take 10 mg by mouth daily.     olmesartan (BENICAR) 20 MG tablet Take 1 tablet by mouth daily.     ondansetron (ZOFRAN) 8 MG tablet Take 1 tablet (8 mg total) by mouth 2 (two) times daily as needed for refractory nausea / vomiting. Start on day 3 after chemotherapy. 60 tablet 1   potassium chloride SA (KLOR-CON M20) 20 MEQ tablet Take 1 tablet (20 mEq total) by mouth daily. 180 tablet 1   Probiotic Product (PROBIOTIC DAILY PO) Take 1 tablet by mouth daily.     prochlorperazine (COMPAZINE) 10 MG tablet Take 1 tablet (10 mg total) by mouth every 6 (six) hours as needed (NAUSEA).  30 tablet 1   senna-docusate (SENNA S) 8.6-50 MG tablet Take 2 tablets by mouth daily. 60 tablet 3   No current facility-administered medications for this visit.   Facility-Administered Medications Ordered in Other Visits  Medication Dose Route Frequency Provider Last Rate Last Admin   0.9 %  sodium chloride infusion  Intravenous Once Nolon Stalls C, MD       0.9 %  sodium chloride infusion   Intravenous Continuous Lequita Asal, MD 10 mL/hr at 12/31/19 1000 New Bag at 10/05/20 1045   heparin lock flush 100 unit/mL  500 Units Intravenous Once Lequita Asal, MD        Performance status (ECOG): 1  Vitals Blood pressure 98/70, pulse 82, temperature (!) 97 F (36.1 C), temperature source Tympanic, weight 240 lb 14.4 oz (109.3 kg), SpO2 100 %.   Physical Exam Vitals and nursing note reviewed.  Constitutional:      General: He is not in acute distress.    Appearance: He is well-developed. He is obese. He is not diaphoretic.     Interventions: Face mask in place.  HENT:     Head: Normocephalic and atraumatic.  Eyes:     General: No scleral icterus.    Extraocular Movements: Extraocular movements intact.     Conjunctiva/sclera: Conjunctivae normal.     Pupils: Pupils are equal, round, and reactive to light.  Cardiovascular:     Rate and Rhythm: Normal rate and regular rhythm.     Heart sounds: Normal heart sounds. No murmur heard. Pulmonary:     Effort: Pulmonary effort is normal. No respiratory distress.     Breath sounds: No wheezing.     Comments: Decreased breath sound bilaterally Abdominal:     General: There is no distension.     Palpations: Abdomen is soft. There is no mass.     Tenderness: There is no abdominal tenderness.  Musculoskeletal:        General: No swelling or tenderness. Normal range of motion.     Cervical back: Normal range of motion and neck supple.     Comments: Trace edema bilaterally.   Lymphadenopathy:     Head:     Right side of  head: No preauricular, posterior auricular or occipital adenopathy.     Left side of head: No preauricular, posterior auricular or occipital adenopathy.     Cervical: No cervical adenopathy.     Upper Body:     Right upper body: No supraclavicular or axillary adenopathy.     Left upper body: No supraclavicular or axillary adenopathy.     Lower Body: No right inguinal adenopathy. No left inguinal adenopathy.  Skin:    General: Skin is warm and dry.  Neurological:     Mental Status: He is alert and oriented to person, place, and time. Mental status is at baseline.  Psychiatric:        Mood and Affect: Mood normal.    Labs were reviewed by me.     Latest Ref Rng & Units 05/23/2022    8:26 AM 05/09/2022    8:41 AM 04/25/2022    8:39 AM  CBC  WBC 4.0 - 10.5 K/uL 5.9  4.0  3.7   Hemoglobin 13.0 - 17.0 g/dL 11.7  11.5  11.7   Hematocrit 39.0 - 52.0 % 35.2  34.9  36.2   Platelets 150 - 400 K/uL 153  166  204       Latest Ref Rng & Units 05/23/2022    8:26 AM 05/09/2022    8:41 AM 04/25/2022    8:39 AM  CMP  Glucose 70 - 99 mg/dL 107  124  145   BUN 8 - 23 mg/dL '11  12  11   '$ Creatinine 0.61 - 1.24 mg/dL 0.80  0.71  0.78  Sodium 135 - 145 mmol/L 136  134  136   Potassium 3.5 - 5.1 mmol/L 3.8  4.1  3.7   Chloride 98 - 111 mmol/L 104  106  102   CO2 22 - 32 mmol/L '23  24  25   '$ Calcium 8.9 - 10.3 mg/dL 9.0  9.4  9.4   Total Protein 6.5 - 8.1 g/dL 6.4  6.6  6.9   Total Bilirubin 0.3 - 1.2 mg/dL 1.1  1.0  0.9   Alkaline Phos 38 - 126 U/L 43  38  37   AST 15 - 41 U/L 38  31  35   ALT 0 - 44 U/L 18  14  15

## 2022-05-23 NOTE — Patient Instructions (Addendum)
[facility] CANCER CTR AT [place]  Discharge Instructions: Thank you for choosing [facility] to provide your oncology and hematology care.  If you have a lab appointment with the [facility], please go directly to the [facility] and check in at the registration area.  Wear comfortable clothing and clothing appropriate for easy access to any Portacath or PICC line.   We strive to give you quality time with your provider. You may need to reschedule your appointment if you arrive late (15 or more minutes).  Arriving late affects you and other patients whose appointments are after yours.  Also, if you miss three or more appointments without notifying the office, you may be dismissed from the clinic at the provider's discretion.      For prescription refill requests, have your pharmacy contact our office and allow 72 hours for refills to be completed.    Today you received the following chemotherapy and/or immunotherapy agents LEUCOVORIN, VEGZELMA, 5 FU      To help prevent nausea and vomiting after your treatment, we encourage you to take your nausea medication as directed.  BELOW ARE SYMPTOMS THAT SHOULD BE REPORTED IMMEDIATELY: *FEVER GREATER THAN 100.4 F (38 C) OR HIGHER *CHILLS OR SWEATING *NAUSEA AND VOMITING THAT IS NOT CONTROLLED WITH YOUR NAUSEA MEDICATION *UNUSUAL SHORTNESS OF BREATH *UNUSUAL BRUISING OR BLEEDING *URINARY PROBLEMS (pain or burning when urinating, or frequent urination) *BOWEL PROBLEMS (unusual diarrhea, constipation, pain near the anus) TENDERNESS IN MOUTH AND THROAT WITH OR WITHOUT PRESENCE OF ULCERS (sore throat, sores in mouth, or a toothache) UNUSUAL RASH, SWELLING OR PAIN  UNUSUAL VAGINAL DISCHARGE OR ITCHING   Items with * indicate a potential emergency and should be followed up as soon as possible or go to the Emergency Department if any problems should occur.  Please show the CHEMOTHERAPY ALERT CARD or IMMUNOTHERAPY ALERT CARD  at check-in to the Emergency Department and triage nurse.  Should you have questions after your visit or need to cancel or reschedule your appointment, please contact [facility] CANCER [place] AT [place]  [phone] and follow the prompts.  Office hours are 8:00 a.m. to 4:30 p.m. Monday - Friday. Please note that voicemails left after 4:00 p.m. may not be returned until the following business day.  We are closed weekends and major holidays. You have access to a nurse at all times for urgent questions. Please call the main number to the clinic [phone] and follow the prompts.  For any non-urgent questions, you may also contact your provider using MyChart. We now offer e-Visits for anyone 2 and older to request care online for non-urgent symptoms. For details visit [email or web address].   Also download the MyChart app! Go to the app store, search "MyChart", open the app, select [place], and log in with your MyChart username and password.  Leucovorin Injection What is this medication? LEUCOVORIN (loo koe VOR in) prevents side effects from certain medications, such as methotrexate. It works by increasing folate levels. This helps protect healthy cells in your body. It may also be used to treat anemia caused by low levels of folate. It can also be used with fluorouracil, a type of chemotherapy, to treat colorectal cancer. It works by increasing the effects of fluorouracil in the body. This medicine may be used for other purposes; ask your health care provider or pharmacist if you have questions. What should I tell my care team before I take this medication? They need to know if you have any  of these conditions: Anemia from low levels of vitamin B12 in the blood An unusual or allergic reaction to leucovorin, folic acid, other medications, foods, dyes, or preservatives Pregnant or trying to get pregnant Breastfeeding How should I use this medication? This medication is injected into  a vein or a muscle. It is given by your care team in a hospital or clinic setting. Talk to your care team about the use of this medication in children. Special care may be needed. Overdosage: If you think you have taken too much of this medicine contact a poison control center or emergency room at once. NOTE: This medicine is only for you. Do not share this medicine with others. What if I miss a dose? Keep appointments for follow-up doses. It is important not to miss your dose. Call your care team if you are unable to keep an appointment. What may interact with this medication? Capecitabine Fluorouracil Phenobarbital Phenytoin Primidone Trimethoprim;sulfamethoxazole This list may not describe all possible interactions. Give your health care provider a list of all the medicines, herbs, non-prescription drugs, or dietary supplements you use. Also tell them if you smoke, drink alcohol, or use illegal drugs. Some items may interact with your medicine. What should I watch for while using this medication? Your condition will be monitored carefully while you are receiving this medication. This medication may increase the side effects of 5-fluorouracil. Tell your care team if you have diarrhea or mouth sores that do not get better or that get worse. What side effects may I notice from receiving this medication? Side effects that you should report to your care team as soon as possible: Allergic reactions--skin rash, itching, hives, swelling of the face, lips, tongue, or throat This list may not describe all possible side effects. Call your doctor for medical advice about side effects. You may report side effects to FDA at 1-800-FDA-1088. Where should I keep my medication? This medication is given in a hospital or clinic. It will not be stored at home. NOTE: This sheet is a summary. It may not cover all possible information. If you have questions about this medicine, talk to your doctor, pharmacist, or  health care provider.  2023 Elsevier/Gold Standard (2021-10-04 00:00:00)  Fluorouracil Injection What is this medication? FLUOROURACIL (flure oh YOOR a sil) treats some types of cancer. It works by slowing down the growth of cancer cells. This medicine may be used for other purposes; ask your health care provider or pharmacist if you have questions. COMMON BRAND NAME(S): Adrucil What should I tell my care team before I take this medication? They need to know if you have any of these conditions: Blood disorders Dihydropyrimidine dehydrogenase (DPD) deficiency Infection, such as chickenpox, cold sores, herpes Kidney disease Liver disease Poor nutrition Recent or ongoing radiation therapy An unusual or allergic reaction to fluorouracil, other medications, foods, dyes, or preservatives If you or your partner are pregnant or trying to get pregnant Breast-feeding How should I use this medication? This medication is injected into a vein. It is administered by your care team in a hospital or clinic setting. Talk to your care team about the use of this medication in children. Special care may be needed. Overdosage: If you think you have taken too much of this medicine contact a poison control center or emergency room at once. NOTE: This medicine is only for you. Do not share this medicine with others. What if I miss a dose? Keep appointments for follow-up doses. It is important not to  miss your dose. Call your care team if you are unable to keep an appointment. What may interact with this medication? Do not take this medication with any of the following: Live virus vaccines This medication may also interact with the following: Medications that treat or prevent blood clots, such as warfarin, enoxaparin, dalteparin This list may not describe all possible interactions. Give your health care provider a list of all the medicines, herbs, non-prescription drugs, or dietary supplements you use. Also  tell them if you smoke, drink alcohol, or use illegal drugs. Some items may interact with your medicine. What should I watch for while using this medication? Your condition will be monitored carefully while you are receiving this medication. This medication may make you feel generally unwell. This is not uncommon as chemotherapy can affect healthy cells as well as cancer cells. Report any side effects. Continue your course of treatment even though you feel ill unless your care team tells you to stop. In some cases, you may be given additional medications to help with side effects. Follow all directions for their use. This medication may increase your risk of getting an infection. Call your care team for advice if you get a fever, chills, sore throat, or other symptoms of a cold or flu. Do not treat yourself. Try to avoid being around people who are sick. This medication may increase your risk to bruise or bleed. Call your care team if you notice any unusual bleeding. Be careful brushing or flossing your teeth or using a toothpick because you may get an infection or bleed more easily. If you have any dental work done, tell your dentist you are receiving this medication. Avoid taking medications that contain aspirin, acetaminophen, ibuprofen, naproxen, or ketoprofen unless instructed by your care team. These medications may hide a fever. Do not treat diarrhea with over the counter products. Contact your care team if you have diarrhea that lasts more than 2 days or if it is severe and watery. This medication can make you more sensitive to the sun. Keep out of the sun. If you cannot avoid being in the sun, wear protective clothing and sunscreen. Do not use sun lamps, tanning beds, or tanning booths. Talk to your care team if you or your partner wish to become pregnant or think you might be pregnant. This medication can cause serious birth defects if taken during pregnancy and for 3 months after the last dose.  A reliable form of contraception is recommended while taking this medication and for 3 months after the last dose. Talk to your care team about effective forms of contraception. Do not father a child while taking this medication and for 3 months after the last dose. Use a condom while having sex during this time period. Do not breastfeed while taking this medication. This medication may cause infertility. Talk to your care team if you are concerned about your fertility. What side effects may I notice from receiving this medication? Side effects that you should report to your care team as soon as possible: Allergic reactions--skin rash, itching, hives, swelling of the face, lips, tongue, or throat Heart attack--pain or tightness in the chest, shoulders, arms, or jaw, nausea, shortness of breath, cold or clammy skin, feeling faint or lightheaded Heart failure--shortness of breath, swelling of the ankles, feet, or hands, sudden weight gain, unusual weakness or fatigue Heart rhythm changes--fast or irregular heartbeat, dizziness, feeling faint or lightheaded, chest pain, trouble breathing High ammonia level--unusual weakness or fatigue, confusion, loss  of appetite, nausea, vomiting, seizures Infection--fever, chills, cough, sore throat, wounds that don't heal, pain or trouble when passing urine, general feeling of discomfort or being unwell Low red blood cell level--unusual weakness or fatigue, dizziness, headache, trouble breathing Pain, tingling, or numbness in the hands or feet, muscle weakness, change in vision, confusion or trouble speaking, loss of balance or coordination, trouble walking, seizures Redness, swelling, and blistering of the skin over hands and feet Severe or prolonged diarrhea Unusual bruising or bleeding Side effects that usually do not require medical attention (report to your care team if they continue or are bothersome): Dry skin Headache Increased tears Nausea Pain,  redness, or swelling with sores inside the mouth or throat Sensitivity to light Vomiting This list may not describe all possible side effects. Call your doctor for medical advice about side effects. You may report side effects to FDA at 1-800-FDA-1088. Where should I keep my medication? This medication is given in a hospital or clinic. It will not be stored at home. NOTE: This sheet is a summary. It may not cover all possible information. If you have questions about this medicine, talk to your doctor, pharmacist, or health care provider.  2023 Elsevier/Gold Standard (2021-08-30 00:00:00)   Bevacizumab Injection What is this medication? BEVACIZUMAB (be va SIZ yoo mab) treats some types of cancer. It works by blocking a protein that causes cancer cells to grow and multiply. This helps to slow or stop the spread of cancer cells. It is a monoclonal antibody. This medicine may be used for other purposes; ask your health care provider or pharmacist if you have questions. COMMON BRAND NAME(S): Alymsys, Avastin, MVASI, Noah Charon What should I tell my care team before I take this medication? They need to know if you have any of these conditions: Blood clots Coughing up blood Having or recent surgery Heart failure High blood pressure History of a connection between 2 or more body parts that do not usually connect (fistula) History of a tear in your stomach or intestines Protein in your urine An unusual or allergic reaction to bevacizumab, other medications, foods, dyes, or preservatives Pregnant or trying to get pregnant Breast-feeding How should I use this medication? This medication is injected into a vein. It is given by your care team in a hospital or clinic setting. Talk to your care team the use of this medication in children. Special care may be needed. Overdosage: If you think you have taken too much of this medicine contact a poison control center or emergency room at once. NOTE: This  medicine is only for you. Do not share this medicine with others. What if I miss a dose? Keep appointments for follow-up doses. It is important not to miss your dose. Call your care team if you are unable to keep an appointment. What may interact with this medication? Interactions are not expected. This list may not describe all possible interactions. Give your health care provider a list of all the medicines, herbs, non-prescription drugs, or dietary supplements you use. Also tell them if you smoke, drink alcohol, or use illegal drugs. Some items may interact with your medicine. What should I watch for while using this medication? Your condition will be monitored carefully while you are receiving this medication. You may need blood work while taking this medication. This medication may make you feel generally unwell. This is not uncommon as chemotherapy can affect healthy cells as well as cancer cells. Report any side effects. Continue your course of treatment  even though you feel ill unless your care team tells you to stop. This medication may increase your risk to bruise or bleed. Call your care team if you notice any unusual bleeding. Before having surgery, talk to your care team to make sure it is ok. This medication can increase the risk of poor healing of your surgical site or wound. You will need to stop this medication for 28 days before surgery. After surgery, wait at least 28 days before restarting this medication. Make sure the surgical site or wound is healed enough before restarting this medication. Talk to your care team if questions. Talk to your care team if you may be pregnant. Serious birth defects can occur if you take this medication during pregnancy and for 6 months after the last dose. Contraception is recommended while taking this medication and for 6 months after the last dose. Your care team can help you find the option that works for you. Do not breastfeed while taking this  medication and for 6 months after the last dose. This medication can cause infertility. Talk to your care team if you are concerned about your fertility. What side effects may I notice from receiving this medication? Side effects that you should report to your care team as soon as possible: Allergic reactions--skin rash, itching, hives, swelling of the face, lips, tongue, or throat Bleeding--bloody or black, tar-like stools, vomiting blood or brown material that looks like coffee grounds, red or dark brown urine, small red or purple spots on skin, unusual bruising or bleeding Blood clot--pain, swelling, or warmth in the leg, shortness of breath, chest pain Heart attack--pain or tightness in the chest, shoulders, arms, or jaw, nausea, shortness of breath, cold or clammy skin, feeling faint or lightheaded Heart failure--shortness of breath, swelling of the ankles, feet, or hands, sudden weight gain, unusual weakness or fatigue Increase in blood pressure Infection--fever, chills, cough, sore throat, wounds that don't heal, pain or trouble when passing urine, general feeling of discomfort or being unwell Infusion reactions--chest pain, shortness of breath or trouble breathing, feeling faint or lightheaded Kidney injury--decrease in the amount of urine, swelling of the ankles, hands, or feet Stomach pain that is severe, does not go away, or gets worse Stroke--sudden numbness or weakness of the face, arm, or leg, trouble speaking, confusion, trouble walking, loss of balance or coordination, dizziness, severe headache, change in vision Sudden and severe headache, confusion, change in vision, seizures, which may be signs of posterior reversible encephalopathy syndrome (PRES) Side effects that usually do not require medical attention (report to your care team if they continue or are bothersome): Back pain Change in taste Diarrhea Dry skin Increased tears Nosebleed This list may not describe all  possible side effects. Call your doctor for medical advice about side effects. You may report side effects to FDA at 1-800-FDA-1088. Where should I keep my medication? This medication is given in a hospital or clinic. It will not be stored at home. NOTE: This sheet is a summary. It may not cover all possible information. If you have questions about this medicine, talk to your doctor, pharmacist, or health care provider.  2023 Elsevier/Gold Standard (2021-09-02 00:00:00)   \[id number]\

## 2022-05-23 NOTE — Assessment & Plan Note (Signed)
Chemotherapy plan as listed above 

## 2022-05-24 LAB — CEA: CEA: 15.2 ng/mL — ABNORMAL HIGH (ref 0.0–4.7)

## 2022-05-25 ENCOUNTER — Inpatient Hospital Stay: Payer: Medicare Other

## 2022-05-25 DIAGNOSIS — Z5112 Encounter for antineoplastic immunotherapy: Secondary | ICD-10-CM | POA: Diagnosis not present

## 2022-05-25 DIAGNOSIS — C786 Secondary malignant neoplasm of retroperitoneum and peritoneum: Secondary | ICD-10-CM

## 2022-05-25 DIAGNOSIS — Z85038 Personal history of other malignant neoplasm of large intestine: Secondary | ICD-10-CM

## 2022-05-25 MED ORDER — SODIUM CHLORIDE 0.9% FLUSH
10.0000 mL | INTRAVENOUS | Status: DC | PRN
  Administered 2022-05-25: 10 mL
  Filled 2022-05-25: qty 10

## 2022-05-25 MED ORDER — HEPARIN SOD (PORK) LOCK FLUSH 100 UNIT/ML IV SOLN
500.0000 [IU] | Freq: Once | INTRAVENOUS | Status: AC | PRN
  Administered 2022-05-25: 500 [IU]
  Filled 2022-05-25: qty 5

## 2022-05-25 NOTE — Patient Instructions (Signed)
[facility] CANCER CTR AT [place]  Discharge Instructions: Thank you for choosing [facility] to provide your oncology and hematology care.  If you have a lab appointment with the [facility], please go directly to the [facility] and check in at the registration area.  Wear comfortable clothing and clothing appropriate for easy access to any Portacath or PICC line.   We strive to give you quality time with your provider. You may need to reschedule your appointment if you arrive late (15 or more minutes).  Arriving late affects you and other patients whose appointments are after yours.  Also, if you miss three or more appointments without notifying the office, you may be dismissed from the clinic at the provider's discretion.      For prescription refill requests, have your pharmacy contact our office and allow 72 hours for refills to be completed.    Today you received the following chemotherapy and/or immunotherapy agents pump dc      To help prevent nausea and vomiting after your treatment, we encourage you to take your nausea medication as directed.  BELOW ARE SYMPTOMS THAT SHOULD BE REPORTED IMMEDIATELY: *FEVER GREATER THAN 100.4 F (38 C) OR HIGHER *CHILLS OR SWEATING *NAUSEA AND VOMITING THAT IS NOT CONTROLLED WITH YOUR NAUSEA MEDICATION *UNUSUAL SHORTNESS OF BREATH *UNUSUAL BRUISING OR BLEEDING *URINARY PROBLEMS (pain or burning when urinating, or frequent urination) *BOWEL PROBLEMS (unusual diarrhea, constipation, pain near the anus) TENDERNESS IN MOUTH AND THROAT WITH OR WITHOUT PRESENCE OF ULCERS (sore throat, sores in mouth, or a toothache) UNUSUAL RASH, SWELLING OR PAIN  UNUSUAL VAGINAL DISCHARGE OR ITCHING   Items with * indicate a potential emergency and should be followed up as soon as possible or go to the Emergency Department if any problems should occur.  Please show the CHEMOTHERAPY ALERT CARD or IMMUNOTHERAPY ALERT CARD at check-in to the  Emergency Department and triage nurse.  Should you have questions after your visit or need to cancel or reschedule your appointment, please contact [facility] CANCER [place] AT [place]  [phone] and follow the prompts.  Office hours are 8:00 a.m. to 4:30 p.m. Monday - Friday. Please note that voicemails left after 4:00 p.m. may not be returned until the following business day.  We are closed weekends and major holidays. You have access to a nurse at all times for urgent questions. Please call the main number to the clinic [phone] and follow the prompts.  For any non-urgent questions, you may also contact your provider using MyChart. We now offer e-Visits for anyone 27 and older to request care online for non-urgent symptoms. For details visit [email or web address].   Also download the MyChart app! Go to the app store, search "MyChart", open the app, select [place], and log in with your MyChart username and password.

## 2022-06-06 ENCOUNTER — Inpatient Hospital Stay: Payer: Medicare Other

## 2022-06-06 ENCOUNTER — Encounter: Payer: Self-pay | Admitting: Oncology

## 2022-06-06 ENCOUNTER — Inpatient Hospital Stay (HOSPITAL_BASED_OUTPATIENT_CLINIC_OR_DEPARTMENT_OTHER): Payer: Medicare Other | Admitting: Oncology

## 2022-06-06 VITALS — BP 103/68 | HR 79 | Temp 97.7°F | Resp 18 | Wt 239.7 lb

## 2022-06-06 DIAGNOSIS — Z85038 Personal history of other malignant neoplasm of large intestine: Secondary | ICD-10-CM | POA: Diagnosis not present

## 2022-06-06 DIAGNOSIS — D6481 Anemia due to antineoplastic chemotherapy: Secondary | ICD-10-CM

## 2022-06-06 DIAGNOSIS — Z5112 Encounter for antineoplastic immunotherapy: Secondary | ICD-10-CM | POA: Diagnosis not present

## 2022-06-06 DIAGNOSIS — T451X5D Adverse effect of antineoplastic and immunosuppressive drugs, subsequent encounter: Secondary | ICD-10-CM

## 2022-06-06 DIAGNOSIS — C184 Malignant neoplasm of transverse colon: Secondary | ICD-10-CM

## 2022-06-06 DIAGNOSIS — Z5111 Encounter for antineoplastic chemotherapy: Secondary | ICD-10-CM

## 2022-06-06 DIAGNOSIS — C786 Secondary malignant neoplasm of retroperitoneum and peritoneum: Secondary | ICD-10-CM

## 2022-06-06 LAB — COMPREHENSIVE METABOLIC PANEL
ALT: 15 U/L (ref 0–44)
AST: 37 U/L (ref 15–41)
Albumin: 3.4 g/dL — ABNORMAL LOW (ref 3.5–5.0)
Alkaline Phosphatase: 42 U/L (ref 38–126)
Anion gap: 10 (ref 5–15)
BUN: 8 mg/dL (ref 8–23)
CO2: 24 mmol/L (ref 22–32)
Calcium: 9.2 mg/dL (ref 8.9–10.3)
Chloride: 100 mmol/L (ref 98–111)
Creatinine, Ser: 0.82 mg/dL (ref 0.61–1.24)
GFR, Estimated: >60 mL/min (ref >60)
Glucose, Bld: 173 mg/dL — ABNORMAL HIGH (ref 70–99)
Potassium: 3.5 mmol/L (ref 3.5–5.1)
Sodium: 134 mmol/L — ABNORMAL LOW (ref 135–145)
Total Bilirubin: 1.2 mg/dL (ref 0.3–1.2)
Total Protein: 6.4 g/dL — ABNORMAL LOW (ref 6.5–8.1)

## 2022-06-06 LAB — CBC WITH DIFFERENTIAL/PLATELET
Abs Immature Granulocytes: 0.01 10*3/uL (ref 0.00–0.07)
Basophils Absolute: 0 10*3/uL (ref 0.0–0.1)
Basophils Relative: 1 %
Eosinophils Absolute: 0.1 10*3/uL (ref 0.0–0.5)
Eosinophils Relative: 2 %
HCT: 35.1 % — ABNORMAL LOW (ref 39.0–52.0)
Hemoglobin: 11.8 g/dL — ABNORMAL LOW (ref 13.0–17.0)
Immature Granulocytes: 0 %
Lymphocytes Relative: 36 %
Lymphs Abs: 1.6 10*3/uL (ref 0.7–4.0)
MCH: 30.4 pg (ref 26.0–34.0)
MCHC: 33.6 g/dL (ref 30.0–36.0)
MCV: 90.5 fL (ref 80.0–100.0)
Monocytes Absolute: 0.4 10*3/uL (ref 0.1–1.0)
Monocytes Relative: 8 %
Neutro Abs: 2.5 10*3/uL (ref 1.7–7.7)
Neutrophils Relative %: 53 %
Platelets: 161 10*3/uL (ref 150–400)
RBC: 3.88 MIL/uL — ABNORMAL LOW (ref 4.22–5.81)
RDW: 15.6 % — ABNORMAL HIGH (ref 11.5–15.5)
WBC: 4.6 10*3/uL (ref 4.0–10.5)
nRBC: 0 % (ref 0.0–0.2)

## 2022-06-06 LAB — PROTEIN, URINE, RANDOM: Total Protein, Urine: 28 mg/dL

## 2022-06-06 MED ORDER — SODIUM CHLORIDE 0.9 % IV SOLN
10.0000 mg | Freq: Once | INTRAVENOUS | Status: AC
  Administered 2022-06-06: 10 mg via INTRAVENOUS
  Filled 2022-06-06: qty 10

## 2022-06-06 MED ORDER — SODIUM CHLORIDE 0.9 % IV SOLN
400.0000 mg/m2 | Freq: Once | INTRAVENOUS | Status: AC
  Administered 2022-06-06: 916 mg via INTRAVENOUS
  Filled 2022-06-06: qty 45.8

## 2022-06-06 MED ORDER — SODIUM CHLORIDE 0.9 % IV SOLN
400.0000 mg/m2 | Freq: Once | INTRAVENOUS | Status: DC
  Filled 2022-06-06: qty 45.8

## 2022-06-06 MED ORDER — SODIUM CHLORIDE 0.9 % IV SOLN
5.0000 mg/kg | Freq: Once | INTRAVENOUS | Status: AC
  Administered 2022-06-06: 500 mg via INTRAVENOUS
  Filled 2022-06-06: qty 16

## 2022-06-06 MED ORDER — SODIUM CHLORIDE 0.9 % IV SOLN
2400.0000 mg/m2 | INTRAVENOUS | Status: DC
  Administered 2022-06-06: 5500 mg via INTRAVENOUS
  Filled 2022-06-06: qty 110

## 2022-06-06 MED ORDER — SODIUM CHLORIDE 0.9 % IV SOLN
Freq: Once | INTRAVENOUS | Status: AC
  Filled 2022-06-06: qty 250

## 2022-06-06 MED ORDER — FLUOROURACIL CHEMO INJECTION 2.5 GM/50ML
400.0000 mg/m2 | Freq: Once | INTRAVENOUS | Status: AC
  Administered 2022-06-06: 900 mg via INTRAVENOUS
  Filled 2022-06-06: qty 18

## 2022-06-06 NOTE — Assessment & Plan Note (Signed)
Chemotherapy plan as listed above 

## 2022-06-06 NOTE — Progress Notes (Signed)
Pt here for follow up. No new concerns voiced.   

## 2022-06-06 NOTE — Progress Notes (Signed)
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   CEA is relatively stable with small fluctuations. CT images were reviewed and findings were discussed. Stable disease.  Labs reviewed and discussed with patient Proceed with 5-FU/bevacizumab treatment today Continue current regimen.  Anemia due to chemotherapy Mild anemia. continue to monitor.  Hemoglobin is close to baseline.  Encounter for antineoplastic chemotherapy Chemotherapy plan as listed above.    Follow-up 2 weeks lab MD 5-FU/bevacizumab -   All questions were answered. The patient knows to call the clinic with any problems, questions or concerns.  Jared Server, MD, PhD [facility] Hematology Oncology 06/06/2022   Chief Complaint: Jared Gutierrez is a 82 y.o. male presents for follow-up of metastatic colon cancer   PERTINENT ONCOLOGY HISTORY Jared Gutierrez is a 82 y.o.[place] who has above oncology history reviewed by me today presented for follow up visit for management of Metastatic colon cancer. [name], patient switched care to me on 11/11/20 Extensive medical record review was performed by me  Oncology History  History of colon cancer, stage I  10/02/2014 Pathology Results   [id number] colonoscopy pathology A. CECAL POLYPS; HOT SNARE AND COLD BIOPSY:  - TUBULAR ADENOMA, MULTIPLE FRAGMENTS.  - NEGATIVE FOR HIGH-GRADE DYSPLASIA AND MALIGNANCY.   B. COLON POLYP, TRANSVERSE; COLD BIOPSY:  - TUBULAR ADENOMA.  - NEGATIVE FOR HIGH-GRADE DYSPLASIA AND MALIGNANCY.     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  Tumor extended into the submucosa. Margins were negative. + lymphovascular invasion. 14 lymph nodes were negative. Pathologic stage was T1 N0.  TRANSVERSE COLON, RESECTION:  - INVASIVE ADENOCARCINOMA ARISING IN A 4.6 CM TUBULOVILLOUS  ADENOMA WITH HIGH GRADE DYSPLASIA (MALIGNANT POLYP).  - SEE SUMMARY BELOW.  [name]  ONCOLOGY SUMMARY: COLON AND RECTUM, RESECTION, AJCC 7TH EDITION  Specimen: transverse colon  Procedure: transverse colon resection  Tumor site: splenic flexure  Tumor size: 1.0 cm (invasive component)  Macroscopic tumor perforation: not specified  Histologic type: invasive adenocarcinoma  Histologic grade: moderately differentiated  Microscopic tumor extension: into submucosa  Margins:       Proximal margin: negative       Distal margin: negative       Circumferential (radial) or mesenteric margin: negative       If all margins uninvolved by invasive carcinoma:       Distance of invasive carcinoma from closest margin:  7.5 cm  to distal margin  Treatment effect: not applicable  Lymph-vascular invasion: present  Perineural invasion: not identified  Tumor deposits (discontinuous extramural extension): not  identified  Pathologic staging:       Primary tumor: pT1        Regional lymph nodes: pN0            Number of nodes examined: 14            Number of nodes involved: 0    04/02/2019 Progression   04/02/2019  PET scan  limited evealed a 2.4 x 2.3 cm (SUV 11) hypermetabolic soft tissue density caudal and anterior to the pancreatic neck favored to represent isolated peritoneal or nodal metastasis in the setting of prior transverse colonic resection (expected primary drainage). Although this was immediately adjacent to the pancreas, a fat plane was maintained, arguing strongly against a pancreatic primary. Otherwise, there was no evidence of hypermetabolic metastasis. 04/17/2019 EUS on  revealed a normal esophagus, stomach, duodenum, and pancreas.  There was a 2.4 x 2.4 cm irregular mass in the retroperitoneum adjacent to, but not involving the pancreatic neck.  FNA and core needle biopsy were performed.  Pathology revealed adenocarcinoma compatible with a metastatic lesion of colorectal origin.  Tumor cells were positive for CK20 and CDX2 and negative for CK7.  NGS: [employer] on 05/15/2019 revealed + [id number] and TP53.  Negative results included BRAF V600E, Her2, NRAS, NTRK, PD-L1 (<1%), and TMB 8.7/Mb (intermediate).  MMR testing from his colon resection on 10/16/2013 was intact with a low probability of MSI-H.   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  He received Neulasta after cycle #4 and #5 secondary to progressive leukopenia.  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  He received a truncated course of FOLFOX with cycle #11 secondary to oxaliplatin reaction.   01/14/2020 Imaging    PET revealed an interval decrease in size and FDG uptake (2.4 x 2.3 cm with SUV 11 to 2.4 x 1.7 cm with SUV 4.09) associated with the previously referenced soft tissue density caudal and anterior to the pancreatic neck  suggesting treatment response. There were no new sites of FDG avid tumor.   08/31/2020 Imaging   08/31/2020 Abdomen and pelvis CT revealed increased size of the index soft tissue lesion inferior and anterior to the pancreatic neck (2.9 x 1.8 cm to 3.5 x 2.9 cm). There were similar prominent retroperitoneal lymph nodes without adenopathy by size criteria. There was no new or enlarging abdominal or pelvic lymph nodes. There were no new interval findings. There was similar circumferential wall thickening of a nondistended urinary bladder, which likely accentuated wall thickening There was hepatic steatosis and aortic atherosclerosis.   11/03/2020, PET showed recurrent peritoneal metastasis in the upper abdomen adjacent to the pancreas.  The lesion is 3.8 x 2.9 cm with SUV of 11.3. No evidence of metastatic peritoneal disease elsewhere in the abdomen pelvis.  No evidence of distant metastasis.   11/03/2020 Imaging   PET scan showed recurrent peritoneal metastasis near pancreas. Given that he has no other distant metastasis. Discussed with radiation oncology. Repeat SBRT may be considered if he does not respond well to systemic chemotherapy.     11/26/2020 -  Chemotherapy   5-FU and bevacizumab.   02/17/2021 Imaging   02/17/2021 CT abdomen pelvis showed partial response.  Mild decrease of peritoneal lesion.  Proceed with 5-FU and bevacizumab today.  Given that he is tolerating current regimen with good life quality, partial response.  Shared decision was made to hold off adding additional chemotherapy agents.   06/08/2021, CT chest abdomen pelvis with contrast showed soft tissue mass at the base of mesentery inferior to the pancreatic neck is unchanged in size.  No progressive disease was identified.   09/07/2021, CT chest abdomen pelvis with contrast showed no substantial changes in size of the mesenteric mass, 2.6 cm.  No suspicious new lesions.  Chronic findings as detailed in the imaging report.    12/05/2021 Imaging   PET scan showed soft tissue mesenteric mass is unchanged in size.  Decreased FDG uptake compared to activity on November 03, 2020.  Fever treated disease.  No new metastatic disease.   03/21/2022 Imaging   CT chest abdomen pelvis  1. Similar size of the peripancreatic presumed peritoneal implant compared to 09/07/2021. 2. No new or progressive disease. 3. Incidental findings, including: Coronary artery atherosclerosis.Aortic Atherosclerosis (ICD10-I70.0). Prostatomegaly with chronic bladder wall thickening, suggesting outlet obstruction.        Other medical problems Chronic lower extremity edema. 12/24/2018, right lower extremity duplex negative for DVT.  Small right Baker's cyst. 08/16/2019, bilateral lower extremity duplex showed no DVT. 08/16/2019 - 08/17/2019 with right lower extremity cellulitis.  He was unable to bear weight.  He was treated with IV fluids, NSAIDs, colchicine, and broad antibiotics (vancomycin and Cefepime).  He was discharged on indomethacin x 5 days and Keflex 500 mg TID x 5 days.  10/05/2020, colonoscopy showed internal hemorrhoids.  Otherwise normal examination.  INTERVAL HISTORY XAVIOR NIAZI is a 82 y.o. male who has above history reviewed by me today presents for follow up visit for management of recurrent metastatic colon cancer Patient was accompanied by wife.   Denies fever, chills, nausea, vomiting, diarrhea, chest pain, shortness of breath, abdominal pain, urinary symptoms. He has no new complains today.    Review of Systems  Constitutional:  Negative for appetite change, chills, diaphoresis, fever and unexpected weight change.  HENT:   Negative for hearing loss, nosebleeds, sore throat and tinnitus.   Respiratory:  Negative for cough, hemoptysis and shortness of breath.   Cardiovascular:  Negative for chest pain and palpitations.  Gastrointestinal:  Negative for abdominal pain, blood in stool, constipation, diarrhea, nausea and  vomiting.  Genitourinary:  Negative for dysuria, frequency and hematuria.   Musculoskeletal:  Positive for arthralgias. Negative for back pain, myalgias and neck pain.  Skin:  Negative for itching and rash.  Neurological:  Positive for numbness. Negative for dizziness and headaches.  Hematological:  Does not bruise/bleed easily.  Psychiatric/Behavioral:  Negative for depression. The patient is not nervous/anxious.       Past Medical History:  Diagnosis Date   Arthritis    OSTEOARTHRITIS   Cancer ([place])    Cavitary lesion of lung    RIGHT LOWER LOBE   Chicken pox    Colon cancer ([facility])    History of kidney stones    Hypertension    Lipoma of colon    Nephrolithiasis    Nephrolithiasis    Obesity    Shingles    Tubular adenoma of colon    multiple fragments    Past Surgical History:  Procedure Laterality Date   COLON SURGERY     COLONOSCOPY N/A 10/02/2014   Procedure: COLONOSCOPY;  Surgeon: Josefine Class, MD;  Location: [facility] ENDOSCOPY;  Service: Endoscopy;  Laterality: N/A;   COLONOSCOPY WITH PROPOFOL N/A 02/16/2017   Procedure: COLONOSCOPY WITH PROPOFOL;  Surgeon: Manya Silvas, MD;  Location: [facility] ENDOSCOPY;  Service: Endoscopy;  Laterality: N/A;   COLONOSCOPY WITH PROPOFOL N/A 10/05/2020   Procedure: COLONOSCOPY WITH PROPOFOL;  Surgeon: Lesly Rubenstein, MD;  Location: [facility] ENDOSCOPY;  Service: Endoscopy;  Laterality: N/A;   EUS N/A 04/17/2019   Procedure: FULL UPPER ENDOSCOPIC ULTRASOUND (EUS) RADIAL;  Surgeon: Holly Bodily, MD;  Location: [facility] ENDOSCOPY;  Service: Gastroenterology;  Laterality: N/A;   KIDNEY STONE SURGERY     PARTIAL COLECTOMY  10/17/2013   PORTACATH PLACEMENT Right 06/13/2019   Procedure: INSERTION PORT-A-CATH;  Surgeon: Benjamine Sprague, DO;  Location: [facility] ORS;  Service: General;  Laterality: Right;    Family History  Problem Relation Age of Onset   Cancer Mother    Breast cancer Mother    COPD Father     Social History:  reports  that he has never smoked. He has never used smokeless tobacco. He reports current alcohol use. He reports that he does not use drugs.    Allergies: No Known Allergies  Current Medications: Current Outpatient Medications  Medication Sig Dispense Refill   acetaminophen (TYLENOL) 500 MG tablet Take 500 mg by mouth every 6 (six) hours as needed.     amLODipine (NORVASC) 2.5 MG tablet Take 2.5 mg by mouth daily.     aspirin EC 81 MG tablet Take 81 mg by mouth daily.     Calcium Carbonate (CALCIUM 600 PO) Take 1 tablet by mouth 2 (two) times daily.     Cholecalciferol (VITAMIN D) 125 MCG (5000 UT) CAPS Take 5,000 mg by mouth.     docusate sodium (COLACE) 100 MG capsule Take 1 capsule (100 mg total) by mouth 2 (two) times daily as needed for mild constipation or moderate constipation. 60 capsule 3   HYDROcodone-acetaminophen (NORCO/VICODIN) 5-325 MG tablet Take 1 tablet by mouth every 6 (six) hours as needed for moderate pain (up to 3 doses for moderate pain.).     lidocaine-prilocaine (EMLA) cream APPLY TO AFFECTED AREA ONCE AS DIRECTED 30 g 3   loperamide (IMODIUM) 2 MG capsule Take 1 capsule (2 mg total) by mouth See admin instructions. Take 2 tablets after first loose stool,  then 1 tablet  after each loose stool; maximum: 8 tablets /day 60 capsule 0   loratadine (CLARITIN) 10 MG tablet Take 10 mg by mouth daily.     olmesartan (BENICAR) 20 MG tablet Take 1 tablet by mouth daily.     ondansetron (ZOFRAN) 8 MG tablet Take 1 tablet (8 mg total) by mouth 2 (two) times daily as needed for refractory nausea / vomiting. Start on day 3 after chemotherapy. 60 tablet 1   potassium chloride SA (KLOR-CON M20) 20 MEQ tablet Take 1 tablet (20 mEq total) by mouth daily. 180 tablet 1   Probiotic Product (PROBIOTIC DAILY PO) Take 1 tablet by mouth daily.     prochlorperazine (COMPAZINE) 10 MG tablet Take 1 tablet (10 mg total) by mouth every 6 (six) hours as needed (NAUSEA). 30 tablet 1   senna-docusate (SENNA S)  8.6-50 MG tablet Take 2 tablets by mouth daily. 60 tablet 3   No current facility-administered medications for this visit.   Facility-Administered Medications Ordered in Other Visits  Medication Dose Route Frequency Provider Last Rate Last Admin   0.9 %  sodium chloride infusion   Intravenous Once Lequita Asal, MD  0.9 %  sodium chloride infusion   Intravenous Continuous Lequita Asal, MD 10 mL/hr at 12/31/19 1000 New Bag at 10/05/20 1045   bevacizumab-adcd (VEGZELMA) 500 mg in sodium chloride 0.9 % 100 mL chemo infusion  5 mg/kg (Treatment Plan Recorded) Intravenous Once Jared Server, MD 720 mL/hr at 06/06/22 1048 500 mg at 06/06/22 1048   fluorouracil (ADRUCIL) 5,500 mg in sodium chloride 0.9 % 140 mL chemo infusion  2,400 mg/m2 (Treatment Plan Recorded) Intravenous 1 day or 1 dose Jared Server, MD       fluorouracil (ADRUCIL) chemo injection 900 mg  400 mg/m2 (Treatment Plan Recorded) Intravenous Once Jared Server, MD       heparin lock flush 100 unit/mL  500 Units Intravenous Once Lequita Asal, MD       leucovorin 916 mg in sodium chloride 0.9 % 250 mL infusion  400 mg/m2 (Treatment Plan Recorded) Intravenous Once Jared Server, MD        Performance status (ECOG): 1  Vitals Blood pressure 103/68, pulse 79, temperature 97.7 F (36.5 C), resp. rate 18, weight 239 lb 11.2 oz (108.7 kg).   Physical Exam Vitals and nursing note reviewed.  Constitutional:      General: He is not in acute distress.    Appearance: He is well-developed. He is obese. He is not diaphoretic.     Interventions: Face mask in place.  HENT:     Head: Normocephalic and atraumatic.  Eyes:     General: No scleral icterus.    Extraocular Movements: Extraocular movements intact.     Conjunctiva/sclera: Conjunctivae normal.     Pupils: Pupils are equal, round, and reactive to light.  Cardiovascular:     Rate and Rhythm: Normal rate and regular rhythm.     Heart sounds: Normal heart sounds. No murmur  heard. Pulmonary:     Effort: Pulmonary effort is normal. No respiratory distress.     Breath sounds: No wheezing.     Comments: Decreased breath sound bilaterally Abdominal:     General: There is no distension.     Palpations: Abdomen is soft. There is no mass.     Tenderness: There is no abdominal tenderness.  Musculoskeletal:        General: No swelling or tenderness. Normal range of motion.     Cervical back: Normal range of motion and neck supple.     Comments: Trace edema bilaterally.   Lymphadenopathy:     Head:     Right side of head: No preauricular, posterior auricular or occipital adenopathy.     Left side of head: No preauricular, posterior auricular or occipital adenopathy.     Cervical: No cervical adenopathy.     Upper Body:     Right upper body: No supraclavicular or axillary adenopathy.     Left upper body: No supraclavicular or axillary adenopathy.     Lower Body: No right inguinal adenopathy. No left inguinal adenopathy.  Skin:    General: Skin is warm and dry.  Neurological:     Mental Status: He is alert and oriented to person, place, and time. Mental status is at baseline.  Psychiatric:        Mood and Affect: Mood normal.    Labs were reviewed by me.     Latest Ref Rng & Units 06/06/2022    8:31 AM 05/23/2022    8:26 AM 05/09/2022    8:41 AM  CBC  WBC 4.0 - 10.5 K/uL 4.6  5.9  4.0   Hemoglobin 13.0 - 17.0 g/dL 11.8  11.7  11.5   Hematocrit 39.0 - 52.0 % 35.1  35.2  34.9   Platelets 150 - 400 K/uL 161  153  166       Latest Ref Rng & Units 06/06/2022    8:31 AM 05/23/2022    8:26 AM 05/09/2022    8:41 AM  CMP  Glucose 70 - 99 mg/dL 173  107  124   BUN 8 - 23 mg/dL '8  11  12   '$ Creatinine 0.61 - 1.24 mg/dL 0.82  0.80  0.71   Sodium 135 - 145 mmol/L 134  136  134   Potassium 3.5 - 5.1 mmol/L 3.5  3.8  4.1   Chloride 98 - 111 mmol/L 100  104  106   CO2 22 - 32 mmol/L '24  23  24   '$ Calcium 8.9 - 10.3 mg/dL 9.2  9.0  9.4   Total Protein 6.5 - 8.1 g/dL  6.4  6.4  6.6   Total Bilirubin 0.3 - 1.2 mg/dL 1.2  1.1  1.0   Alkaline Phos 38 - 126 U/L 42  43  38   AST 15 - 41 U/L 37  38  31   ALT 0 - 44 U/L 15  18  14

## 2022-06-06 NOTE — Assessment & Plan Note (Signed)
Mild anemia. continue to monitor.  Hemoglobin is close to baseline. 

## 2022-06-06 NOTE — Assessment & Plan Note (Addendum)
Overall he tolerates chemotherapy.   CEA is relatively stable with small fluctuations. CT images were reviewed and findings were discussed. Stable disease.  Labs reviewed and discussed with patient Proceed with 5-FU/bevacizumab treatment today Continue current regimen. Repeat CT end of Feb

## 2022-06-07 ENCOUNTER — Other Ambulatory Visit: Payer: Self-pay

## 2022-06-07 LAB — CEA: CEA: 15.6 ng/mL — ABNORMAL HIGH (ref 0.0–4.7)

## 2022-06-08 ENCOUNTER — Inpatient Hospital Stay: Payer: Medicare Other

## 2022-06-08 VITALS — BP 158/92 | HR 84 | Resp 18

## 2022-06-08 DIAGNOSIS — C786 Secondary malignant neoplasm of retroperitoneum and peritoneum: Secondary | ICD-10-CM

## 2022-06-08 DIAGNOSIS — Z85038 Personal history of other malignant neoplasm of large intestine: Secondary | ICD-10-CM

## 2022-06-08 DIAGNOSIS — Z5112 Encounter for antineoplastic immunotherapy: Secondary | ICD-10-CM | POA: Diagnosis not present

## 2022-06-08 MED ORDER — SODIUM CHLORIDE 0.9% FLUSH
10.0000 mL | INTRAVENOUS | Status: DC | PRN
  Administered 2022-06-08: 10 mL
  Filled 2022-06-08: qty 10

## 2022-06-08 MED ORDER — HEPARIN SOD (PORK) LOCK FLUSH 100 UNIT/ML IV SOLN
500.0000 [IU] | Freq: Once | INTRAVENOUS | Status: AC | PRN
  Administered 2022-06-08: 500 [IU]
  Filled 2022-06-08: qty 5

## 2022-06-19 MED FILL — Dexamethasone Sodium Phosphate Inj 100 MG/10ML: INTRAMUSCULAR | Qty: 1 | Status: AC

## 2022-06-20 ENCOUNTER — Inpatient Hospital Stay: Payer: Medicare Other

## 2022-06-20 ENCOUNTER — Inpatient Hospital Stay: Payer: Medicare Other | Attending: Oncology

## 2022-06-20 ENCOUNTER — Encounter: Payer: Self-pay | Admitting: Oncology

## 2022-06-20 ENCOUNTER — Inpatient Hospital Stay (HOSPITAL_BASED_OUTPATIENT_CLINIC_OR_DEPARTMENT_OTHER): Payer: Medicare Other | Admitting: Oncology

## 2022-06-20 VITALS — BP 137/87 | HR 83 | Temp 98.8°F | Resp 18 | Wt 236.3 lb

## 2022-06-20 DIAGNOSIS — Z86010 Personal history of colonic polyps: Secondary | ICD-10-CM | POA: Insufficient documentation

## 2022-06-20 DIAGNOSIS — Z85038 Personal history of other malignant neoplasm of large intestine: Secondary | ICD-10-CM

## 2022-06-20 DIAGNOSIS — C184 Malignant neoplasm of transverse colon: Secondary | ICD-10-CM | POA: Insufficient documentation

## 2022-06-20 DIAGNOSIS — E669 Obesity, unspecified: Secondary | ICD-10-CM | POA: Diagnosis not present

## 2022-06-20 DIAGNOSIS — Z5111 Encounter for antineoplastic chemotherapy: Secondary | ICD-10-CM

## 2022-06-20 DIAGNOSIS — Z5112 Encounter for antineoplastic immunotherapy: Secondary | ICD-10-CM | POA: Insufficient documentation

## 2022-06-20 DIAGNOSIS — M7121 Synovial cyst of popliteal space [Baker], right knee: Secondary | ICD-10-CM | POA: Insufficient documentation

## 2022-06-20 DIAGNOSIS — Z803 Family history of malignant neoplasm of breast: Secondary | ICD-10-CM | POA: Diagnosis not present

## 2022-06-20 DIAGNOSIS — Z452 Encounter for adjustment and management of vascular access device: Secondary | ICD-10-CM | POA: Diagnosis not present

## 2022-06-20 DIAGNOSIS — R972 Elevated prostate specific antigen [PSA]: Secondary | ICD-10-CM | POA: Diagnosis not present

## 2022-06-20 DIAGNOSIS — T451X5D Adverse effect of antineoplastic and immunosuppressive drugs, subsequent encounter: Secondary | ICD-10-CM | POA: Diagnosis not present

## 2022-06-20 DIAGNOSIS — Z809 Family history of malignant neoplasm, unspecified: Secondary | ICD-10-CM | POA: Insufficient documentation

## 2022-06-20 DIAGNOSIS — D6481 Anemia due to antineoplastic chemotherapy: Secondary | ICD-10-CM | POA: Diagnosis not present

## 2022-06-20 DIAGNOSIS — Z836 Family history of other diseases of the respiratory system: Secondary | ICD-10-CM | POA: Insufficient documentation

## 2022-06-20 DIAGNOSIS — C786 Secondary malignant neoplasm of retroperitoneum and peritoneum: Secondary | ICD-10-CM | POA: Diagnosis not present

## 2022-06-20 DIAGNOSIS — T451X5A Adverse effect of antineoplastic and immunosuppressive drugs, initial encounter: Secondary | ICD-10-CM

## 2022-06-20 LAB — CBC WITH DIFFERENTIAL/PLATELET
Abs Immature Granulocytes: 0.01 10*3/uL (ref 0.00–0.07)
Basophils Absolute: 0 10*3/uL (ref 0.0–0.1)
Basophils Relative: 1 %
Eosinophils Absolute: 0.1 10*3/uL (ref 0.0–0.5)
Eosinophils Relative: 2 %
HCT: 37 % — ABNORMAL LOW (ref 39.0–52.0)
Hemoglobin: 12.1 g/dL — ABNORMAL LOW (ref 13.0–17.0)
Immature Granulocytes: 0 %
Lymphocytes Relative: 34 %
Lymphs Abs: 1.6 10*3/uL (ref 0.7–4.0)
MCH: 30.6 pg (ref 26.0–34.0)
MCHC: 32.7 g/dL (ref 30.0–36.0)
MCV: 93.7 fL (ref 80.0–100.0)
Monocytes Absolute: 0.5 10*3/uL (ref 0.1–1.0)
Monocytes Relative: 10 %
Neutro Abs: 2.5 10*3/uL (ref 1.7–7.7)
Neutrophils Relative %: 53 %
Platelets: 162 10*3/uL (ref 150–400)
RBC: 3.95 MIL/uL — ABNORMAL LOW (ref 4.22–5.81)
RDW: 15.4 % (ref 11.5–15.5)
WBC: 4.7 10*3/uL (ref 4.0–10.5)
nRBC: 0 % (ref 0.0–0.2)

## 2022-06-20 LAB — COMPREHENSIVE METABOLIC PANEL
ALT: 17 U/L (ref 0–44)
AST: 37 U/L (ref 15–41)
Albumin: 3.6 g/dL (ref 3.5–5.0)
Alkaline Phosphatase: 41 U/L (ref 38–126)
Anion gap: 10 (ref 5–15)
BUN: 12 mg/dL (ref 8–23)
CO2: 23 mmol/L (ref 22–32)
Calcium: 9.3 mg/dL (ref 8.9–10.3)
Chloride: 104 mmol/L (ref 98–111)
Creatinine, Ser: 0.82 mg/dL (ref 0.61–1.24)
GFR, Estimated: >60 mL/min (ref >60)
Glucose, Bld: 134 mg/dL — ABNORMAL HIGH (ref 70–99)
Potassium: 3.7 mmol/L (ref 3.5–5.1)
Sodium: 137 mmol/L (ref 135–145)
Total Bilirubin: 0.9 mg/dL (ref 0.3–1.2)
Total Protein: 6.5 g/dL (ref 6.5–8.1)

## 2022-06-20 LAB — PROTEIN, URINE, RANDOM: Total Protein, Urine: 25 mg/dL

## 2022-06-20 MED ORDER — SODIUM CHLORIDE 0.9 % IV SOLN
10.0000 mg | Freq: Once | INTRAVENOUS | Status: AC
  Administered 2022-06-20: 10 mg via INTRAVENOUS
  Filled 2022-06-20: qty 10

## 2022-06-20 MED ORDER — SODIUM CHLORIDE 0.9 % IV SOLN
400.0000 mg/m2 | Freq: Once | INTRAVENOUS | Status: DC
  Filled 2022-06-20: qty 45.8

## 2022-06-20 MED ORDER — HEPARIN SOD (PORK) LOCK FLUSH 100 UNIT/ML IV SOLN
500.0000 [IU] | Freq: Once | INTRAVENOUS | Status: DC | PRN
  Filled 2022-06-20: qty 5

## 2022-06-20 MED ORDER — SODIUM CHLORIDE 0.9 % IV SOLN
2400.0000 mg/m2 | INTRAVENOUS | Status: DC
  Administered 2022-06-20: 5000 mg via INTRAVENOUS
  Filled 2022-06-20: qty 100

## 2022-06-20 MED ORDER — SODIUM CHLORIDE 0.9 % IV SOLN
Freq: Once | INTRAVENOUS | Status: AC
  Filled 2022-06-20: qty 250

## 2022-06-20 MED ORDER — SODIUM CHLORIDE 0.9 % IV SOLN
5.0000 mg/kg | Freq: Once | INTRAVENOUS | Status: AC
  Administered 2022-06-20: 500 mg via INTRAVENOUS
  Filled 2022-06-20: qty 16

## 2022-06-20 MED ORDER — SODIUM CHLORIDE 0.9 % IV SOLN
916.0000 mg | Freq: Once | INTRAVENOUS | Status: AC
  Administered 2022-06-20: 916 mg via INTRAVENOUS
  Filled 2022-06-20: qty 45.8

## 2022-06-20 MED ORDER — FLUOROURACIL CHEMO INJECTION 2.5 GM/50ML
900.0000 mg | Freq: Once | INTRAVENOUS | Status: AC
  Administered 2022-06-20: 900 mg via INTRAVENOUS
  Filled 2022-06-20: qty 18

## 2022-06-20 NOTE — Assessment & Plan Note (Signed)
Recommend urology evaluation,  he will discuss with PCP I have sent results to PCP

## 2022-06-20 NOTE — Assessment & Plan Note (Signed)
Mild anemia. continue to monitor.  Hemoglobin is close to baseline. 

## 2022-06-20 NOTE — Assessment & Plan Note (Signed)
Overall he tolerates chemotherapy.   CEA is relatively stable with small fluctuations. CT images were reviewed and findings were discussed. Stable disease.  Labs reviewed and discussed with patient Proceed with 5-FU/bevacizumab treatment today Continue current regimen. Repeat CT end of Feb

## 2022-06-20 NOTE — Progress Notes (Signed)
Hematology/Oncology Progress note [phone] Fax:([phone]    Chief Complaint: Jared Gutierrez is a 82 y.o. male presents for follow-up of metastatic colon cancer   ASSESSMENT & PLAN:   Cancer Staging  Cancer of transverse colon [facility]) Staging form: Colon and Rectum, [name], [name], cM1) - Signed by [name], MD on 10/26/2021   Cancer of transverse colon ([place]) Overall he tolerates chemotherapy.   CEA is relatively stable with small fluctuations. CT images were reviewed and findings were discussed. Stable disease.  Labs reviewed and discussed with patient Proceed with 5-FU/bevacizumab treatment today Continue current regimen. Repeat CT end of Feb  Anemia due to chemotherapy Mild anemia. continue to monitor.  Hemoglobin is close to baseline.  Encounter for antineoplastic chemotherapy Chemotherapy plan as listed above.   Elevated PSA Recommend urology evaluation,  he will discuss with PCP I have sent results to PCP   Follow-up 2 weeks lab MD 5-FU/bevacizumab -   All questions were answered. The patient knows to call the clinic with any problems, questions or concerns.  [name], MD, PhD [facility] Hematology Oncology 06/20/2022     PERTINENT ONCOLOGY HISTORY Jared Gutierrez is a 82 y.o.[place] who has above oncology history reviewed by me today presented for follow up visit for management of Metastatic colon cancer. [name], patient switched care to me on 11/11/20 Extensive medical record review was performed by me  Oncology History  History of colon cancer, stage I  10/02/2014 Pathology Results   [id number] colonoscopy pathology A. CECAL POLYPS; HOT SNARE AND COLD BIOPSY:  - TUBULAR ADENOMA, MULTIPLE FRAGMENTS.  - NEGATIVE FOR HIGH-GRADE DYSPLASIA AND MALIGNANCY.   B. COLON POLYP, TRANSVERSE; COLD BIOPSY:  - TUBULAR ADENOMA.  - NEGATIVE FOR HIGH-GRADE DYSPLASIA AND  MALIGNANCY.     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  Tumor extended into the submucosa. Margins were negative. + lymphovascular invasion. 14 lymph nodes were negative. Pathologic stage was T1 N0.  TRANSVERSE COLON, RESECTION:  - INVASIVE ADENOCARCINOMA ARISING IN A 4.6 CM TUBULOVILLOUS  ADENOMA WITH HIGH GRADE DYSPLASIA (MALIGNANT POLYP).  - SEE SUMMARY BELOW.  [name]  ONCOLOGY SUMMARY: COLON AND RECTUM, RESECTION, AJCC 7TH EDITION  Specimen: transverse colon  Procedure: transverse colon resection  Tumor site: splenic flexure  Tumor size: 1.0 cm (invasive component)  Macroscopic tumor perforation: not specified  Histologic type: invasive adenocarcinoma  Histologic grade: moderately differentiated  Microscopic tumor extension: into submucosa  Margins:       Proximal margin: negative       Distal margin: negative       Circumferential (radial) or mesenteric margin: negative  If all margins uninvolved by invasive carcinoma:       Distance of invasive carcinoma from closest margin: 7.5 cm  to distal margin  Treatment effect: not applicable  Lymph-vascular invasion: present  Perineural invasion: not identified  Tumor  deposits (discontinuous extramural extension): not  identified  Pathologic staging:       Primary tumor: pT1       Regional lymph nodes: pN0            Number of nodes examined: 14            Number of nodes involved: 0    04/02/2019 Progression   04/02/2019  PET scan  limited evealed a 2.4 x 2.3 cm (SUV 11) hypermetabolic soft tissue density caudal and anterior to the pancreatic neck favored to represent isolated peritoneal or nodal metastasis in the setting of prior transverse colonic resection (expected primary drainage). Although this was immediately adjacent to the pancreas, a fat plane was maintained, arguing strongly against a pancreatic primary. Otherwise, there was no evidence of hypermetabolic metastasis. 04/17/2019 EUS on  revealed a normal esophagus, stomach, duodenum, and pancreas.  There was a 2.4 x 2.4 cm irregular mass in the retroperitoneum adjacent to, but not involving the pancreatic neck.  FNA and core needle biopsy were performed.  Pathology revealed adenocarcinoma compatible with a metastatic lesion of colorectal origin.  Tumor cells were positive for CK20 and CDX2 and negative for CK7.  NGS: [employer] on 05/15/2019 revealed + KRASG13D and TP53.  Negative results included BRAF V600E, Her2, NRAS, NTRK, PD-L1 (<1%), and TMB 8.7/Mb (intermediate).  MMR testing from his colon resection on 10/16/2013 was intact with a low probability of MSI-H.   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  He received Neulasta after cycle #4 and #5 secondary to progressive leukopenia.  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  He received a truncated course of FOLFOX with cycle #11 secondary to oxaliplatin reaction.   01/14/2020 Imaging    PET revealed an interval decrease in size and FDG uptake (2.4 x 2.3 cm with SUV 11 to 2.4 x 1.7 cm with  SUV 4.09) associated with the previously referenced soft tissue density caudal and anterior to the pancreatic neck suggesting treatment response. There were no new sites of FDG avid tumor.   08/31/2020 Imaging   08/31/2020 Abdomen and pelvis CT revealed increased size of the index soft tissue lesion inferior and anterior to the pancreatic neck (2.9 x 1.8 cm to 3.5 x 2.9 cm). There were similar prominent retroperitoneal lymph nodes without adenopathy by size criteria. There was no new or enlarging abdominal or pelvic lymph nodes. There were no new interval findings. There was similar circumferential wall thickening of a nondistended urinary bladder, which likely accentuated wall thickening There was hepatic steatosis and aortic atherosclerosis.   11/03/2020, PET showed recurrent peritoneal metastasis in the upper abdomen adjacent to the pancreas.  The lesion is 3.8 x 2.9 cm with SUV of 11.3. No evidence of metastatic peritoneal disease elsewhere in the abdomen pelvis.  No evidence of distant metastasis.   11/03/2020 Imaging   PET scan showed recurrent peritoneal metastasis near pancreas. Given that he has no other distant metastasis. Discussed with radiation oncology. Repeat SBRT may be considered if he does not respond well to systemic chemotherapy.     11/26/2020 -  Chemotherapy   5-FU and bevacizumab.   02/17/2021 Imaging   02/17/2021 CT abdomen pelvis showed partial response. Mild decrease of peritoneal lesion.  Proceed with 5-FU and bevacizumab today.  Given that he is tolerating current regimen with good life quality, partial response.  Shared decision was made to hold off adding additional chemotherapy agents.   06/08/2021, CT chest abdomen pelvis with contrast showed soft tissue mass at the base of mesentery inferior to the pancreatic neck is unchanged in size.  No progressive disease was identified.   09/07/2021, CT chest abdomen pelvis with contrast showed no substantial changes in size of  the mesenteric mass, 2.6 cm.  No suspicious new lesions.  Chronic findings as detailed in the imaging report.   12/05/2021 Imaging   PET scan showed soft tissue mesenteric mass is unchanged in size.  Decreased FDG uptake compared to activity on November 03, 2020.  Fever treated disease.  No new metastatic disease.   03/21/2022 Imaging   CT chest abdomen pelvis  1. Similar size of the peripancreatic presumed peritoneal implant compared to 09/07/2021. 2. No new or progressive disease. 3. Incidental findings, including: Coronary artery atherosclerosis.Aortic Atherosclerosis (ICD10-I70.0). Prostatomegaly with chronic bladder wall thickening, suggesting outlet obstruction.        Other medical problems Chronic lower extremity edema. 12/24/2018, right lower extremity duplex negative for DVT.  Small right Baker's cyst. 08/16/2019, bilateral lower extremity duplex showed no DVT. 08/16/2019 - 08/17/2019 with right lower extremity cellulitis.  He was unable to bear weight.  He was treated with IV fluids, NSAIDs, colchicine, and broad antibiotics (vancomycin and Cefepime).  He was discharged on indomethacin x 5 days and Keflex 500 mg TID x 5 days.  10/05/2020, colonoscopy showed internal hemorrhoids.  Otherwise normal examination.  INTERVAL HISTORY LENVIL SWAIM is a 82 y.o. male who has above history reviewed by me today presents for follow up visit for management of recurrent metastatic colon cancer Patient was accompanied by wife.   Denies fever, chills, nausea, vomiting, diarrhea, chest pain, shortness of breath, abdominal pain, urinary symptoms. He has no new complains today.  Lost a few pounds since last visit. Appetite is good.    Review of Systems  Constitutional:  Negative for appetite change, chills, diaphoresis, fever and unexpected weight change.  HENT:   Negative for hearing loss, nosebleeds, sore throat and tinnitus.   Respiratory:  Negative for cough, hemoptysis and shortness of breath.    Cardiovascular:  Negative for chest pain and palpitations.  Gastrointestinal:  Negative for abdominal pain, blood in stool, constipation, diarrhea, nausea and vomiting.  Genitourinary:  Negative for dysuria, frequency and hematuria.   Musculoskeletal:  Positive for arthralgias. Negative for back pain, myalgias and neck pain.  Skin:  Negative for itching and rash.  Neurological:  Positive for numbness. Negative for dizziness and headaches.  Hematological:  Does not bruise/bleed easily.  Psychiatric/Behavioral:  Negative for depression. The patient is not nervous/anxious.       Past Medical History:  Diagnosis Date   Arthritis    OSTEOARTHRITIS   Cancer ([place])    Cavitary lesion of lung    RIGHT LOWER LOBE   Chicken pox    Colon cancer ([facility])    History of kidney stones    Hypertension    Lipoma of colon    Nephrolithiasis    Nephrolithiasis    Obesity    Shingles    Tubular adenoma of colon    multiple fragments    Past Surgical  History:  Procedure Laterality Date   COLON SURGERY     COLONOSCOPY N/A 10/02/2014   Procedure: COLONOSCOPY;  Surgeon: Josefine Class, MD;  Location: [facility] ENDOSCOPY;  Service: Endoscopy;  Laterality: N/A;   COLONOSCOPY WITH PROPOFOL N/A 02/16/2017   Procedure: COLONOSCOPY WITH PROPOFOL;  Surgeon: Manya Silvas, MD;  Location: [facility] ENDOSCOPY;  Service: Endoscopy;  Laterality: N/A;   COLONOSCOPY WITH PROPOFOL N/A 10/05/2020   Procedure: COLONOSCOPY WITH PROPOFOL;  Surgeon: Lesly Rubenstein, MD;  Location: [facility] ENDOSCOPY;  Service: Endoscopy;  Laterality: N/A;   EUS N/A 04/17/2019   Procedure: FULL UPPER ENDOSCOPIC ULTRASOUND (EUS) RADIAL;  Surgeon: Holly Bodily, MD;  Location: [facility] ENDOSCOPY;  Service: Gastroenterology;  Laterality: N/A;   KIDNEY STONE SURGERY     PARTIAL COLECTOMY  10/17/2013   PORTACATH PLACEMENT Right 06/13/2019   Procedure: INSERTION PORT-A-CATH;  Surgeon: Benjamine Sprague, DO;  Location: [facility] ORS;  Service: General;   Laterality: Right;    Family History  Problem Relation Age of Onset   Cancer Mother    Breast cancer Mother    COPD Father     Social History:  reports that he has never smoked. He has never used smokeless tobacco. He reports current alcohol use. He reports that he does not use drugs.    Allergies: No Known Allergies  Current Medications: Current Outpatient Medications  Medication Sig Dispense Refill   acetaminophen (TYLENOL) 500 MG tablet Take 500 mg by mouth every 6 (six) hours as needed.     amLODipine (NORVASC) 2.5 MG tablet Take 2.5 mg by mouth daily.     aspirin EC 81 MG tablet Take 81 mg by mouth daily.     Calcium Carbonate (CALCIUM 600 PO) Take 1 tablet by mouth 2 (two) times daily.     Cholecalciferol (VITAMIN D) 125 MCG (5000 UT) CAPS Take 5,000 mg by mouth.     docusate sodium (COLACE) 100 MG capsule Take 1 capsule (100 mg total) by mouth 2 (two) times daily as needed for mild constipation or moderate constipation. 60 capsule 3   HYDROcodone-acetaminophen (NORCO/VICODIN) 5-325 MG tablet Take 1 tablet by mouth every 6 (six) hours as needed for moderate pain (up to 3 doses for moderate pain.).     lidocaine-prilocaine (EMLA) cream APPLY TO AFFECTED AREA ONCE AS DIRECTED 30 g 3   loperamide (IMODIUM) 2 MG capsule Take 1 capsule (2 mg total) by mouth See admin instructions. Take 2 tablets after first loose stool,  then 1 tablet  after each loose stool; maximum: 8 tablets /day 60 capsule 0   loratadine (CLARITIN) 10 MG tablet Take 10 mg by mouth daily.     olmesartan (BENICAR) 20 MG tablet Take 1 tablet by mouth daily.     ondansetron (ZOFRAN) 8 MG tablet Take 1 tablet (8 mg total) by mouth 2 (two) times daily as needed for refractory nausea / vomiting. Start on day 3 after chemotherapy. 60 tablet 1   potassium chloride SA (KLOR-CON M20) 20 MEQ tablet Take 1 tablet (20 mEq total) by mouth daily. 180 tablet 1   Probiotic Product (PROBIOTIC DAILY PO) Take 1 tablet by mouth daily.      prochlorperazine (COMPAZINE) 10 MG tablet Take 1 tablet (10 mg total) by mouth every 6 (six) hours as needed (NAUSEA). 30 tablet 1   senna-docusate (SENNA S) 8.6-50 MG tablet Take 2 tablets by mouth daily. 60 tablet 3   No current facility-administered medications for this visit.   Facility-Administered Medications  Ordered in Other Visits  Medication Dose Route Frequency Provider Last Rate Last Admin   0.9 %  sodium chloride infusion   Intravenous Once Corcoran, Melissa C, MD       0.9 %  sodium chloride infusion   Intravenous Continuous Mike Gip, Melissa C, MD 10 mL/hr at 12/31/19 1000 New Bag at 10/05/20 1045   heparin lock flush 100 unit/mL  500 Units Intravenous Once Lequita Asal, MD        Performance status (ECOG): 1  Vitals Blood pressure 137/87, pulse 83, temperature 98.8 F (37.1 C), resp. rate 18, weight 236 lb 4.8 oz (107.2 kg).   Physical Exam Vitals and nursing note reviewed.  Constitutional:      General: He is not in acute distress.    Appearance: He is well-developed. He is obese. He is not diaphoretic.     Interventions: Face mask in place.  HENT:     Head: Normocephalic and atraumatic.  Eyes:     General: No scleral icterus.    Extraocular Movements: Extraocular movements intact.     Conjunctiva/sclera: Conjunctivae normal.     Pupils: Pupils are equal, round, and reactive to light.  Cardiovascular:     Rate and Rhythm: Normal rate and regular rhythm.     Heart sounds: Normal heart sounds. No murmur heard. Pulmonary:     Effort: Pulmonary effort is normal. No respiratory distress.     Breath sounds: No wheezing.     Comments: Decreased breath sound bilaterally Abdominal:     General: There is no distension.     Palpations: Abdomen is soft. There is no mass.     Tenderness: There is no abdominal tenderness.  Musculoskeletal:        General: No swelling or tenderness. Normal range of motion.     Cervical back: Normal range of motion and neck  supple.     Comments: Trace edema bilaterally.   Lymphadenopathy:     Head:     Right side of head: No preauricular, posterior auricular or occipital adenopathy.     Left side of head: No preauricular, posterior auricular or occipital adenopathy.     Cervical: No cervical adenopathy.     Upper Body:     Right upper body: No supraclavicular or axillary adenopathy.     Left upper body: No supraclavicular or axillary adenopathy.     Lower Body: No right inguinal adenopathy. No left inguinal adenopathy.  Skin:    General: Skin is warm and dry.  Neurological:     Mental Status: He is alert and oriented to person, place, and time. Mental status is at baseline.  Psychiatric:        Mood and Affect: Mood normal.    Labs were reviewed by me.     Latest Ref Rng & Units 06/20/2022    8:43 AM 06/06/2022    8:31 AM 05/23/2022    8:26 AM  CBC  WBC 4.0 - 10.5 K/uL 4.7  4.6  5.9   Hemoglobin 13.0 - 17.0 g/dL 12.1  11.8  11.7   Hematocrit 39.0 - 52.0 % 37.0  35.1  35.2   Platelets 150 - 400 K/uL 162  161  153       Latest Ref Rng & Units 06/20/2022    8:43 AM 06/06/2022    8:31 AM 05/23/2022    8:26 AM  CMP  Glucose 70 - 99 mg/dL 134  173  107   BUN 8 -  23 mg/dL '12  8  11   '$ Creatinine 0.61 - 1.24 mg/dL 0.82  0.82  0.80   Sodium 135 - 145 mmol/L 137  134  136   Potassium 3.5 - 5.1 mmol/L 3.7  3.5  3.8   [place] 98 - 111 mmol/L 104  100  104   CO2 22 - 32 mmol/L '23  24  23   '$ Calcium 8.9 - 10.3 mg/dL 9.3  9.2  9.0   Total Protein 6.5 - 8.1 g/dL 6.5  6.4  6.4   Total Bilirubin 0.3 - 1.2 mg/dL 0.9  1.2  1.1   Alkaline Phos 38 - 126 U/L 41  42  43   AST 15 - 41 U/L 37  37  38   ALT 0 - 44 U/L 17  15  18

## 2022-06-20 NOTE — Assessment & Plan Note (Signed)
Chemotherapy plan as listed above 

## 2022-06-22 ENCOUNTER — Inpatient Hospital Stay: Payer: Medicare Other

## 2022-06-22 VITALS — BP 148/79

## 2022-06-22 DIAGNOSIS — Z5112 Encounter for antineoplastic immunotherapy: Secondary | ICD-10-CM | POA: Diagnosis not present

## 2022-06-22 DIAGNOSIS — Z85038 Personal history of other malignant neoplasm of large intestine: Secondary | ICD-10-CM

## 2022-06-22 DIAGNOSIS — C786 Secondary malignant neoplasm of retroperitoneum and peritoneum: Secondary | ICD-10-CM

## 2022-06-22 MED ORDER — SODIUM CHLORIDE 0.9% FLUSH
10.0000 mL | INTRAVENOUS | Status: DC | PRN
  Administered 2022-06-22: 10 mL
  Filled 2022-06-22: qty 10

## 2022-06-22 MED ORDER — HEPARIN SOD (PORK) LOCK FLUSH 100 UNIT/ML IV SOLN
500.0000 [IU] | Freq: Once | INTRAVENOUS | Status: AC | PRN
  Administered 2022-06-22: 500 [IU]
  Filled 2022-06-22: qty 5

## 2022-07-03 MED FILL — Dexamethasone Sodium Phosphate Inj 100 MG/10ML: INTRAMUSCULAR | Qty: 1 | Status: AC

## 2022-07-04 ENCOUNTER — Inpatient Hospital Stay: Payer: Medicare Other

## 2022-07-04 ENCOUNTER — Inpatient Hospital Stay (HOSPITAL_BASED_OUTPATIENT_CLINIC_OR_DEPARTMENT_OTHER): Payer: Medicare Other | Admitting: Oncology

## 2022-07-04 ENCOUNTER — Encounter: Payer: Self-pay | Admitting: Oncology

## 2022-07-04 VITALS — BP 126/76 | HR 70 | Temp 97.3°F | Resp 18 | Wt 237.8 lb

## 2022-07-04 DIAGNOSIS — C786 Secondary malignant neoplasm of retroperitoneum and peritoneum: Secondary | ICD-10-CM

## 2022-07-04 DIAGNOSIS — Z85038 Personal history of other malignant neoplasm of large intestine: Secondary | ICD-10-CM | POA: Diagnosis not present

## 2022-07-04 DIAGNOSIS — K5909 Other constipation: Secondary | ICD-10-CM | POA: Diagnosis not present

## 2022-07-04 DIAGNOSIS — Z5112 Encounter for antineoplastic immunotherapy: Secondary | ICD-10-CM | POA: Diagnosis not present

## 2022-07-04 DIAGNOSIS — C184 Malignant neoplasm of transverse colon: Secondary | ICD-10-CM | POA: Diagnosis not present

## 2022-07-04 DIAGNOSIS — D6481 Anemia due to antineoplastic chemotherapy: Secondary | ICD-10-CM

## 2022-07-04 DIAGNOSIS — R972 Elevated prostate specific antigen [PSA]: Secondary | ICD-10-CM

## 2022-07-04 DIAGNOSIS — T451X5A Adverse effect of antineoplastic and immunosuppressive drugs, initial encounter: Secondary | ICD-10-CM

## 2022-07-04 DIAGNOSIS — Z5111 Encounter for antineoplastic chemotherapy: Secondary | ICD-10-CM

## 2022-07-04 DIAGNOSIS — R040 Epistaxis: Secondary | ICD-10-CM

## 2022-07-04 LAB — COMPREHENSIVE METABOLIC PANEL
ALT: 16 U/L (ref 0–44)
AST: 32 U/L (ref 15–41)
Albumin: 3.5 g/dL (ref 3.5–5.0)
Alkaline Phosphatase: 41 U/L (ref 38–126)
Anion gap: 9 (ref 5–15)
BUN: 10 mg/dL (ref 8–23)
CO2: 24 mmol/L (ref 22–32)
Calcium: 9.1 mg/dL (ref 8.9–10.3)
Chloride: 104 mmol/L (ref 98–111)
Creatinine, Ser: 0.73 mg/dL (ref 0.61–1.24)
GFR, Estimated: >60 mL/min (ref >60)
Glucose, Bld: 106 mg/dL — ABNORMAL HIGH (ref 70–99)
Potassium: 3.6 mmol/L (ref 3.5–5.1)
Sodium: 137 mmol/L (ref 135–145)
Total Bilirubin: 1.3 mg/dL — ABNORMAL HIGH (ref 0.3–1.2)
Total Protein: 6.5 g/dL (ref 6.5–8.1)

## 2022-07-04 LAB — CBC WITH DIFFERENTIAL/PLATELET
Abs Immature Granulocytes: 0.01 10*3/uL (ref 0.00–0.07)
Basophils Absolute: 0 10*3/uL (ref 0.0–0.1)
Basophils Relative: 1 %
Eosinophils Absolute: 0.1 10*3/uL (ref 0.0–0.5)
Eosinophils Relative: 1 %
HCT: 36.2 % — ABNORMAL LOW (ref 39.0–52.0)
Hemoglobin: 11.6 g/dL — ABNORMAL LOW (ref 13.0–17.0)
Immature Granulocytes: 0 %
Lymphocytes Relative: 33 %
Lymphs Abs: 1.7 10*3/uL (ref 0.7–4.0)
MCH: 30.1 pg (ref 26.0–34.0)
MCHC: 32 g/dL (ref 30.0–36.0)
MCV: 93.8 fL (ref 80.0–100.0)
Monocytes Absolute: 0.5 10*3/uL (ref 0.1–1.0)
Monocytes Relative: 11 %
Neutro Abs: 2.7 10*3/uL (ref 1.7–7.7)
Neutrophils Relative %: 54 %
Platelets: 168 10*3/uL (ref 150–400)
RBC: 3.86 MIL/uL — ABNORMAL LOW (ref 4.22–5.81)
RDW: 15.2 % (ref 11.5–15.5)
WBC: 5 10*3/uL (ref 4.0–10.5)
nRBC: 0 % (ref 0.0–0.2)

## 2022-07-04 LAB — PROTEIN, URINE, RANDOM: Total Protein, Urine: 35 mg/dL

## 2022-07-04 MED ORDER — SODIUM CHLORIDE 0.9 % IV SOLN
10.0000 mg | Freq: Once | INTRAVENOUS | Status: AC
  Administered 2022-07-04: 10 mg via INTRAVENOUS
  Filled 2022-07-04: qty 10

## 2022-07-04 MED ORDER — SODIUM CHLORIDE 0.9 % IV SOLN
5.0000 mg/kg | Freq: Once | INTRAVENOUS | Status: AC
  Administered 2022-07-04: 500 mg via INTRAVENOUS
  Filled 2022-07-04: qty 16

## 2022-07-04 MED ORDER — FLUOROURACIL CHEMO INJECTION 2.5 GM/50ML
400.0000 mg/m2 | Freq: Once | INTRAVENOUS | Status: AC
  Administered 2022-07-04: 1000 mg via INTRAVENOUS
  Filled 2022-07-04: qty 20

## 2022-07-04 MED ORDER — SODIUM CHLORIDE 0.9 % IV SOLN
2400.0000 mg/m2 | INTRAVENOUS | Status: DC
  Administered 2022-07-04: 5000 mg via INTRAVENOUS
  Filled 2022-07-04: qty 100

## 2022-07-04 MED ORDER — SODIUM CHLORIDE 0.9 % IV SOLN
400.0000 mg/m2 | Freq: Once | INTRAVENOUS | Status: DC
  Filled 2022-07-04: qty 45.8

## 2022-07-04 MED ORDER — SODIUM CHLORIDE 0.9 % IV SOLN
916.0000 mg | Freq: Once | INTRAVENOUS | Status: AC
  Administered 2022-07-04: 916 mg via INTRAVENOUS
  Filled 2022-07-04: qty 45.8

## 2022-07-04 MED ORDER — SODIUM CHLORIDE 0.9 % IV SOLN
Freq: Once | INTRAVENOUS | Status: AC
  Filled 2022-07-04: qty 250

## 2022-07-04 NOTE — Assessment & Plan Note (Signed)
He has been on Colace 173m 1-2 times daily. And Miralax daily.  Recommend change to Senna-S 2 tablet daily, if needed, he can take another dose of colace 1045mdaily. Miralax daily PRN.  Discussed about GI evaluation for blood in stool. Patient would like to defer GI work up and try new bowel regimen first.  Encourage patient to increase oral hydration.

## 2022-07-04 NOTE — Assessment & Plan Note (Signed)
Recommend urology evaluation,  he prefers to hold off and he will discuss with PCP I have sent results to PCP

## 2022-07-04 NOTE — Assessment & Plan Note (Addendum)
Overall he tolerates chemotherapy.   CEA is relatively stable with small fluctuations. CT images were reviewed and findings were discussed. Stable disease.  Labs reviewed and discussed with patient Proceed with 5-FU/bevacizumab treatment today Continue current regimen. Repeat CT

## 2022-07-04 NOTE — Assessment & Plan Note (Signed)
Chemotherapy plan as listed above 

## 2022-07-04 NOTE — Assessment & Plan Note (Signed)
Likely due to dry air/heat.  Recommend humidifier, lucricants to both nostril twice daily PRN

## 2022-07-04 NOTE — Assessment & Plan Note (Signed)
Mild anemia. continue to monitor.  Hemoglobin is close to baseline.

## 2022-07-04 NOTE — Patient Instructions (Signed)
[facility]  Discharge Instructions: Thank you for choosing [facility] to provide your oncology and hematology care.  If you have a lab appointment with the [facility], please go directly to the [facility] and check in at the registration area.  Wear comfortable clothing and clothing appropriate for easy access to any Portacath or PICC line.   We strive to give you quality time with your provider. You may need to reschedule your appointment if you arrive late (15 or more minutes).  Arriving late affects you and other patients whose appointments are after yours.  Also, if you miss three or more appointments without notifying the office, you may be dismissed from the clinic at the provider's discretion.      For prescription refill requests, have your pharmacy contact our office and allow 72 hours for refills to be completed.    Today you received the following chemotherapy and/or immunotherapy agents- bevacizumab, leucovorin, 5FU      To help prevent nausea and vomiting after your treatment, we encourage you to take your nausea medication as directed.  BELOW ARE SYMPTOMS THAT SHOULD BE REPORTED IMMEDIATELY: *FEVER GREATER THAN 100.4 F (38 C) OR HIGHER *CHILLS OR SWEATING *NAUSEA AND VOMITING THAT IS NOT CONTROLLED WITH YOUR NAUSEA MEDICATION *UNUSUAL SHORTNESS OF BREATH *UNUSUAL BRUISING OR BLEEDING *URINARY PROBLEMS (pain or burning when urinating, or frequent urination) *BOWEL PROBLEMS (unusual diarrhea, constipation, pain near the anus) TENDERNESS IN MOUTH AND THROAT WITH OR WITHOUT PRESENCE OF ULCERS (sore throat, sores in mouth, or a toothache) UNUSUAL RASH, SWELLING OR PAIN  UNUSUAL VAGINAL DISCHARGE OR ITCHING   Items with * indicate a potential emergency and should be followed up as soon as possible or go to the Emergency Department if any problems should occur.  Please show the CHEMOTHERAPY ALERT CARD or IMMUNOTHERAPY ALERT  CARD at check-in to the Emergency Department and triage nurse.  Should you have questions after your visit or need to cancel or reschedule your appointment, please contact [facility]  [phone] and follow the prompts.  Office hours are 8:00 a.m. to 4:30 p.m. Monday - Friday. Please note that voicemails left after 4:00 p.m. may not be returned until the following business day.  We are closed weekends and major holidays. You have access to a nurse at all times for urgent questions. Please call the main number to the clinic [phone] and follow the prompts.  For any non-urgent questions, you may also contact your provider using MyChart. We now offer e-Visits for anyone 98 and older to request care online for non-urgent symptoms. For details visit [email or web address].   Also download the MyChart app! Go to the app store, search "MyChart", open the app, select [facility], and log in with your MyChart username and password.

## 2022-07-04 NOTE — Progress Notes (Signed)
Hematology/Oncology Progress note [phone] [phone]    Chief Complaint: Jared Gutierrez is a 82 y.o. male presents for follow-up of metastatic colon cancer   ASSESSMENT & PLAN:   Cancer Staging  Cancer of transverse colon [facility]) Staging form: Colon and Rectum, [name], [name], cM1) - Signed by [name], MD on 10/26/2021   Cancer of transverse colon ([place]) Overall he tolerates chemotherapy.   CEA is relatively stable with small fluctuations. CT images were reviewed and findings were discussed. Stable disease.  Labs reviewed and discussed with patient Proceed with 5-FU/bevacizumab treatment today Continue current regimen. Repeat CT  Constipation He has been on Colace 152m 1-2 times daily. And Miralax daily.  Recommend change to Senna-S 2 tablet daily, if needed, he can take another dose of colace 1052mdaily. Miralax daily PRN.  Discussed about GI evaluation for blood in stool. Patient would like to defer GI work up and try new bowel regimen first.  Encourage patient to increase oral hydration.   Encounter for antineoplastic chemotherapy Chemotherapy plan as listed above.   Elevated PSA Recommend urology evaluation,  he prefers to hold off and he will discuss with PCP I have sent results to PCP  Anemia due to chemotherapy Mild anemia. continue to monitor.  Hemoglobin is close to baseline.  Epistaxis Likely due to dry air/heat.  Recommend humidifier, lucricants to both nostril twice daily PRN   Follow-up 2 weeks lab MD 5-FU/bevacizumab -   All questions were answered. The patient knows to call the clinic with any problems, questions or concerns.  ZhEarlie ServerMD, PhD [facility]cology 07/04/2022     PERTINENT ONCOLOGY HISTORY Jared ALARIEs a [age].o.[place] who has above oncology history reviewed by me today presented for follow up visit for management of Metastatic colon cancer. Patient  previously followed up by Dr.Corcoran, patient switched care to me on 11/11/20 Extensive medical record review was performed by me  Oncology History  History of colon cancer, stage I  10/02/2014 Pathology Results   [id number] colonoscopy pathology A. CECAL POLYPS; HOT SNARE AND COLD BIOPSY:  - TUBULAR ADENOMA, MULTIPLE FRAGMENTS.  - NEGATIVE FOR HIGH-GRADE DYSPLASIA AND MALIGNANCY.   B. COLON POLYP, TRANSVERSE; COLD BIOPSY:  - TUBULAR ADENOMA.  - NEGATIVE FOR HIGH-GRADE DYSPLASIA AND MALIGNANCY.     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  Tumor extended into the submucosa. Margins were negative. + lymphovascular invasion. 14 lymph nodes were negative. Pathologic stage was T1 N0.  TRANSVERSE COLON, RESECTION:  - INVASIVE ADENOCARCINOMA ARISING IN A 4.6 CM TUBULOVILLOUS  ADENOMA WITH HIGH GRADE DYSPLASIA (MALIGNANT POLYP).  - SEE SUMMARY BELOW.  .[name]  ONCOLOGY SUMMARY: COLON AND RECTUM, RESECTION, AJCC 7TH EDITION  Specimen: transverse colon  Procedure: transverse colon resection  Tumor site: splenic flexure  Tumor size: 1.0 cm (invasive component)  Macroscopic tumor perforation: not specified  Histologic type: invasive  adenocarcinoma  Histologic grade: moderately differentiated  Microscopic tumor extension: into submucosa  Margins:       Proximal margin: negative       Distal margin: negative       Circumferential (radial) or mesenteric margin: negative       If all margins uninvolved by invasive carcinoma:       Distance of invasive carcinoma from closest margin: 7.5 cm  to distal margin  Treatment effect: not applicable  Lymph-vascular invasion: present  Perineural invasion: not identified  Tumor deposits (discontinuous extramural extension): not  identified  Pathologic staging:       Primary tumor: pT1       Regional lymph nodes: pN0            Number of nodes examined: 14            Number of nodes involved: 0    04/02/2019 Progression   04/02/2019  PET scan  limited evealed a 2.4 x 2.3 cm (SUV 11) hypermetabolic soft tissue density caudal and anterior to the pancreatic neck favored to represent isolated peritoneal or nodal metastasis in the setting of prior transverse colonic resection (expected primary drainage). Although this was immediately adjacent to the pancreas, a fat plane was maintained, arguing strongly against a pancreatic primary. Otherwise, there was no evidence of hypermetabolic metastasis. 04/17/2019 EUS on  revealed a normal esophagus, stomach, duodenum, and pancreas.  There was a 2.4 x 2.4 cm irregular mass in the retroperitoneum adjacent to, but not involving the pancreatic neck.  FNA and core needle biopsy were performed.  Pathology revealed adenocarcinoma compatible with a metastatic lesion of colorectal origin.  Tumor cells were positive for CK20 and CDX2 and negative for CK7.  NGS: [employer] on 05/15/2019 revealed + KRASG13D and TP53.  Negative results included BRAF V600E, Her2, NRAS, NTRK, PD-L1 (<1%), and TMB 8.7/Mb (intermediate).  MMR testing from his colon resection on 10/16/2013 was intact with a low probability of MSI-H.   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  He received Neulasta after cycle #4 and #5 secondary to progressive leukopenia.  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  He received a truncated course of FOLFOX with cycle #11 secondary to oxaliplatin reaction.   01/14/2020 Imaging    PET revealed an interval decrease in size and FDG uptake (2.4 x 2.3 cm with SUV 11 to 2.4 x 1.7 cm with SUV 4.09) associated with the previously referenced soft tissue density caudal and anterior to the pancreatic neck suggesting treatment response. There were no new sites of FDG avid tumor.   08/31/2020 Imaging   08/31/2020 Abdomen and pelvis CT revealed increased size of the index soft tissue lesion inferior and anterior to the pancreatic neck (2.9 x 1.8 cm to 3.5 x 2.9 cm). There were similar prominent retroperitoneal lymph nodes without adenopathy by size criteria. There was no new or enlarging abdominal or pelvic lymph nodes. There were no new interval findings. There was similar circumferential wall thickening of a nondistended urinary bladder, which likely accentuated wall thickening There was hepatic steatosis and aortic atherosclerosis.  11/03/2020, PET showed recurrent peritoneal metastasis in the upper abdomen adjacent to the pancreas.  The lesion is 3.8 x 2.9 cm with SUV of 11.3. No evidence of metastatic peritoneal disease elsewhere in the abdomen pelvis.  No evidence of distant metastasis.   11/03/2020 Imaging   PET scan showed recurrent peritoneal metastasis near pancreas. Given that he has no other distant metastasis. Discussed with radiation oncology. Repeat SBRT may be considered if he does not respond well to systemic chemotherapy.     11/26/2020 -  Chemotherapy   5-FU and bevacizumab.   02/17/2021 Imaging   02/17/2021 CT abdomen pelvis showed partial response. Mild decrease of peritoneal  lesion.  Proceed with 5-FU and bevacizumab today.  Given that he is tolerating current regimen with good life quality, partial response.  Shared decision was made to hold off adding additional chemotherapy agents.   06/08/2021, CT chest abdomen pelvis with contrast showed soft tissue mass at the base of mesentery inferior to the pancreatic neck is unchanged in size.  No progressive disease was identified.   09/07/2021, CT chest abdomen pelvis with contrast showed no substantial changes in size of the mesenteric mass, 2.6 cm.  No suspicious new lesions.  Chronic findings as detailed in the imaging report.   12/05/2021 Imaging   PET scan showed soft tissue mesenteric mass is unchanged in size.  Decreased FDG uptake compared to activity on November 03, 2020.  Fever treated disease.  No new metastatic disease.   03/21/2022 Imaging   CT chest abdomen pelvis  1. Similar size of the peripancreatic presumed peritoneal implant compared to 09/07/2021. 2. No new or progressive disease. 3. Incidental findings, including: Coronary artery atherosclerosis.Aortic Atherosclerosis (ICD10-I70.0). Prostatomegaly with chronic bladder wall thickening, suggesting outlet obstruction.        Other medical problems Chronic lower extremity edema. 12/24/2018, right lower extremity duplex negative for DVT.  Small right Baker's cyst. 08/16/2019, bilateral lower extremity duplex showed no DVT. 08/16/2019 - 08/17/2019 with right lower extremity cellulitis.  He was unable to bear weight.  He was treated with IV fluids, NSAIDs, colchicine, and broad antibiotics (vancomycin and Cefepime).  He was discharged on indomethacin x 5 days and Keflex 500 mg TID x 5 days.  10/05/2020, colonoscopy showed internal hemorrhoids.  Otherwise normal examination.  INTERVAL HISTORY MEGHAN FAATZ is a 82 y.o. male who has above history reviewed by me today presents for follow up visit for management of recurrent metastatic colon cancer Patient was  accompanied by wife.   Denies fever, chills, nausea, vomiting, diarrhea, chest pain, shortness of breath, abdominal pain, urinary + Nose bleeding x 1 episode      Review of Systems  Constitutional:  Negative for appetite change, chills, diaphoresis, fever and unexpected weight change.  HENT:   Positive for nosebleeds. Negative for hearing loss, sore throat and tinnitus.   Respiratory:  Negative for cough, hemoptysis and shortness of breath.   Cardiovascular:  Negative for chest pain and palpitations.  Gastrointestinal:  Negative for abdominal pain, blood in stool, constipation, diarrhea, nausea and vomiting.  Genitourinary:  Negative for dysuria, frequency and hematuria.   Musculoskeletal:  Positive for arthralgias. Negative for back pain, myalgias and neck pain.  Skin:  Negative for itching and rash.  Neurological:  Positive for numbness. Negative for dizziness and headaches.  Hematological:  Does not bruise/bleed easily.  Psychiatric/Behavioral:  Negative for depression. The patient is not nervous/anxious.       Past Medical History:  Diagnosis Date  Arthritis    OSTEOARTHRITIS   Cancer ([place])    Cavitary lesion of lung    RIGHT LOWER LOBE   Chicken pox    Colon cancer (H[facility])    History of kidney stones    Hypertension    Lipoma of colon    Nephrolithiasis    Nephrolithiasis    Obesity    Shingles    Tubular adenoma of colon    multiple fragments    Past Surgical History:  Procedure Laterality Date   COLON SURGERY     COLONOSCOPY N/A 10/02/2014   Procedure: COLONOSCOPY;  Surgeon: Josefine Class, MD;  Location: [facility] ENDOSCOPY;  Service: Endoscopy;  Laterality: N/A;   COLONOSCOPY WITH PROPOFOL N/A 02/16/2017   Procedure: COLONOSCOPY WITH PROPOFOL;  Surgeon: Manya Silvas, MD;  Location: [facility] ENDOSCOPY;  Service: Endoscopy;  Laterality: N/A;   COLONOSCOPY WITH PROPOFOL N/A 10/05/2020   Procedure: COLONOSCOPY WITH PROPOFOL;  Surgeon: Lesly Rubenstein, MD;   Location: [facility] ENDOSCOPY;  Service: Endoscopy;  Laterality: N/A;   EUS N/A 04/17/2019   Procedure: FULL UPPER ENDOSCOPIC ULTRASOUND (EUS) RADIAL;  Surgeon: Holly Bodily, MD;  Location: [facility] ENDOSCOPY;  Service: Gastroenterology;  Laterality: N/A;   KIDNEY STONE SURGERY     PARTIAL COLECTOMY  10/17/2013   PORTACATH PLACEMENT Right 06/13/2019   Procedure: INSERTION PORT-A-CATH;  Surgeon: Benjamine Sprague, DO;  Location: [facility] ORS;  Service: General;  Laterality: Right;    Family History  Problem Relation Age of Onset   Cancer Mother    Breast cancer Mother    COPD Father     Social History:  reports that he has never smoked. He has never used smokeless tobacco. He reports current alcohol use. He reports that he does not use drugs.    Allergies: No Known Allergies  Current Medications: Current Outpatient Medications  Medication Sig Dispense Refill   acetaminophen (TYLENOL) 500 MG tablet Take 500 mg by mouth every 6 (six) hours as needed.     amLODipine (NORVASC) 2.5 MG tablet Take 2.5 mg by mouth daily.     aspirin EC 81 MG tablet Take 81 mg by mouth daily.     Calcium Carbonate (CALCIUM 600 PO) Take 1 tablet by mouth 2 (two) times daily.     Cholecalciferol (VITAMIN D) 125 MCG (5000 UT) CAPS Take 5,000 mg by mouth.     docusate sodium (COLACE) 100 MG capsule Take 1 capsule (100 mg total) by mouth 2 (two) times daily as needed for mild constipation or moderate constipation. 60 capsule 3   HYDROcodone-acetaminophen (NORCO/VICODIN) 5-325 MG tablet Take 1 tablet by mouth every 6 (six) hours as needed for moderate pain (up to 3 doses for moderate pain.).     lidocaine-prilocaine (EMLA) cream APPLY TO AFFECTED AREA ONCE AS DIRECTED 30 g 3   loperamide (IMODIUM) 2 MG capsule Take 1 capsule (2 mg total) by mouth See admin instructions. Take 2 tablets after first loose stool,  then 1 tablet  after each loose stool; maximum: 8 tablets /day 60 capsule 0   loratadine (CLARITIN) 10 MG tablet Take  10 mg by mouth daily.     olmesartan (BENICAR) 20 MG tablet Take 1 tablet by mouth daily.     ondansetron (ZOFRAN) 8 MG tablet Take 1 tablet (8 mg total) by mouth 2 (two) times daily as needed for refractory nausea / vomiting. Start on day 3 after chemotherapy. 60 tablet 1   potassium chloride SA (KLOR-CON M20) 20 MEQ tablet  Take 1 tablet (20 mEq total) by mouth daily. 180 tablet 1   Probiotic Product (PROBIOTIC DAILY PO) Take 1 tablet by mouth daily.     prochlorperazine (COMPAZINE) 10 MG tablet Take 1 tablet (10 mg total) by mouth every 6 (six) hours as needed (NAUSEA). 30 tablet 1   senna-docusate (SENNA S) 8.6-50 MG tablet Take 2 tablets by mouth daily. 60 tablet 3   No current facility-administered medications for this visit.   Facility-Administered Medications Ordered in Other Visits  Medication Dose Route Frequency Provider Last Rate Last Admin   0.9 %  sodium chloride infusion   Intravenous Once Corcoran, Melissa C, MD       0.9 %  sodium chloride infusion   Intravenous Continuous Nolon Stalls C, MD 10 mL/hr at 12/31/19 1000 New Bag at 10/05/20 1045   heparin lock flush 100 unit/mL  500 Units Intravenous Once Lequita Asal, MD        Performance status (ECOG): 1  Vitals Blood pressure 126/76, pulse 70, temperature (!) 97.3 F (36.3 C), temperature source Tympanic, resp. rate 18, weight 237 lb 12.8 oz (107.9 kg), SpO2 100 %.   Physical Exam Vitals and nursing note reviewed.  Constitutional:      General: He is not in acute distress.    Appearance: He is well-developed. He is obese. He is not diaphoretic.     Interventions: Face mask in place.  HENT:     Head: Normocephalic and atraumatic.  Eyes:     General: No scleral icterus.    Extraocular Movements: Extraocular movements intact.     Conjunctiva/sclera: Conjunctivae normal.     Pupils: Pupils are equal, round, and reactive to light.  Cardiovascular:     Rate and Rhythm: Normal rate and regular rhythm.      Heart sounds: Normal heart sounds. No murmur heard. Pulmonary:     Effort: Pulmonary effort is normal. No respiratory distress.     Breath sounds: No wheezing.     Comments: Decreased breath sound bilaterally Abdominal:     General: There is no distension.     Palpations: Abdomen is soft. There is no mass.     Tenderness: There is no abdominal tenderness.  Musculoskeletal:        General: No swelling or tenderness. Normal range of motion.     Cervical back: Normal range of motion and neck supple.     Comments: Trace edema bilaterally.   Lymphadenopathy:     Head:     Right side of head: No preauricular, posterior auricular or occipital adenopathy.     Left side of head: No preauricular, posterior auricular or occipital adenopathy.     Cervical: No cervical adenopathy.     Upper Body:     Right upper body: No supraclavicular or axillary adenopathy.     Left upper body: No supraclavicular or axillary adenopathy.     Lower Body: No right inguinal adenopathy. No left inguinal adenopathy.  Skin:    General: Skin is warm and dry.  Neurological:     Mental Status: He is alert and oriented to person, place, and time. Mental status is at baseline.  Psychiatric:        Mood and Affect: Mood normal.    Labs were reviewed by me.     Latest Ref Rng & Units 07/04/2022    8:54 AM 06/20/2022    8:43 AM 06/06/2022    8:31 AM  CBC  WBC 4.0 - 10.5  K/uL 5.0  4.7  4.6   Hemoglobin 13.0 - 17.0 g/dL 11.6  12.1  11.8   Hematocrit 39.0 - 52.0 % 36.2  37.0  35.1   Platelets 150 - 400 K/uL 168  162  161       Latest Ref Rng & Units 07/04/2022    8:54 AM 06/20/2022    8:43 AM 06/06/2022    8:31 AM  CMP  Glucose 70 - 99 mg/dL 106  134  173   BUN 8 - 23 mg/dL 10  12  8   $ Creatinine 0.61 - 1.24 mg/dL 0.73  0.82  0.82   Sodium 135 - 145 mmol/L 137  137  134   Potassium 3.5 - 5.1 mmol/L 3.6  3.7  3.5   Chloride 98 - 111 mmol/L 104  104  100   CO2 22 - 32 mmol/L 24  23  24   $ Calcium 8.9 - 10.3 mg/dL 9.1   9.3  9.2   Total Protein 6.5 - 8.1 g/dL 6.5  6.5  6.4   Total Bilirubin 0.3 - 1.2 mg/dL 1.3  0.9  1.2   Alkaline Phos 38 - 126 U/L 41  41  42   AST 15 - 41 U/L 32  37  37   ALT 0 - 44 U/L 16  17  15

## 2022-07-06 ENCOUNTER — Inpatient Hospital Stay: Payer: Medicare Other

## 2022-07-06 DIAGNOSIS — C786 Secondary malignant neoplasm of retroperitoneum and peritoneum: Secondary | ICD-10-CM

## 2022-07-06 DIAGNOSIS — Z5112 Encounter for antineoplastic immunotherapy: Secondary | ICD-10-CM | POA: Diagnosis not present

## 2022-07-06 DIAGNOSIS — Z85038 Personal history of other malignant neoplasm of large intestine: Secondary | ICD-10-CM

## 2022-07-06 LAB — CEA: CEA: 15.9 ng/mL — ABNORMAL HIGH (ref 0.0–4.7)

## 2022-07-06 MED ORDER — HEPARIN SOD (PORK) LOCK FLUSH 100 UNIT/ML IV SOLN
500.0000 [IU] | Freq: Once | INTRAVENOUS | Status: AC | PRN
  Administered 2022-07-06: 500 [IU]
  Filled 2022-07-06: qty 5

## 2022-07-06 MED ORDER — SODIUM CHLORIDE 0.9% FLUSH
10.0000 mL | INTRAVENOUS | Status: DC | PRN
  Administered 2022-07-06: 10 mL
  Filled 2022-07-06: qty 10

## 2022-07-11 ENCOUNTER — Ambulatory Visit
Admission: RE | Admit: 2022-07-11 | Discharge: 2022-07-11 | Disposition: A | Discharge status: Home / Self Care | Payer: Medicare Other | Source: Ambulatory Visit | Attending: Oncology | Admitting: Oncology

## 2022-07-11 DIAGNOSIS — C184 Malignant neoplasm of transverse colon: Secondary | ICD-10-CM | POA: Insufficient documentation

## 2022-07-11 MED ORDER — IOHEXOL 300 MG/ML  SOLN
100.0000 mL | Freq: Once | INTRAMUSCULAR | Status: AC | PRN
  Administered 2022-07-11: 100 mL via INTRAVENOUS

## 2022-07-17 MED FILL — Dexamethasone Sodium Phosphate Inj 100 MG/10ML: INTRAMUSCULAR | Qty: 1 | Status: AC

## 2022-07-18 ENCOUNTER — Inpatient Hospital Stay (HOSPITAL_BASED_OUTPATIENT_CLINIC_OR_DEPARTMENT_OTHER): Payer: Medicare Other | Admitting: Oncology

## 2022-07-18 ENCOUNTER — Inpatient Hospital Stay: Payer: Medicare Other

## 2022-07-18 ENCOUNTER — Inpatient Hospital Stay: Payer: Medicare Other | Attending: Oncology

## 2022-07-18 ENCOUNTER — Encounter: Payer: Self-pay | Admitting: Oncology

## 2022-07-18 VITALS — Resp 18

## 2022-07-18 VITALS — BP 116/68 | HR 79 | Temp 96.9°F | Wt 238.1 lb

## 2022-07-18 DIAGNOSIS — T451X5D Adverse effect of antineoplastic and immunosuppressive drugs, subsequent encounter: Secondary | ICD-10-CM | POA: Insufficient documentation

## 2022-07-18 DIAGNOSIS — C786 Secondary malignant neoplasm of retroperitoneum and peritoneum: Secondary | ICD-10-CM

## 2022-07-18 DIAGNOSIS — Z86010 Personal history of colonic polyps: Secondary | ICD-10-CM | POA: Diagnosis not present

## 2022-07-18 DIAGNOSIS — Z5112 Encounter for antineoplastic immunotherapy: Secondary | ICD-10-CM | POA: Insufficient documentation

## 2022-07-18 DIAGNOSIS — D6481 Anemia due to antineoplastic chemotherapy: Secondary | ICD-10-CM

## 2022-07-18 DIAGNOSIS — Z836 Family history of other diseases of the respiratory system: Secondary | ICD-10-CM | POA: Diagnosis not present

## 2022-07-18 DIAGNOSIS — T451X5A Adverse effect of antineoplastic and immunosuppressive drugs, initial encounter: Secondary | ICD-10-CM

## 2022-07-18 DIAGNOSIS — R972 Elevated prostate specific antigen [PSA]: Secondary | ICD-10-CM | POA: Insufficient documentation

## 2022-07-18 DIAGNOSIS — Z5111 Encounter for antineoplastic chemotherapy: Secondary | ICD-10-CM | POA: Diagnosis present

## 2022-07-18 DIAGNOSIS — C184 Malignant neoplasm of transverse colon: Secondary | ICD-10-CM | POA: Diagnosis not present

## 2022-07-18 DIAGNOSIS — Z85038 Personal history of other malignant neoplasm of large intestine: Secondary | ICD-10-CM

## 2022-07-18 DIAGNOSIS — Z809 Family history of malignant neoplasm, unspecified: Secondary | ICD-10-CM | POA: Insufficient documentation

## 2022-07-18 DIAGNOSIS — Z803 Family history of malignant neoplasm of breast: Secondary | ICD-10-CM | POA: Insufficient documentation

## 2022-07-18 DIAGNOSIS — M7121 Synovial cyst of popliteal space [Baker], right knee: Secondary | ICD-10-CM | POA: Insufficient documentation

## 2022-07-18 DIAGNOSIS — Z452 Encounter for adjustment and management of vascular access device: Secondary | ICD-10-CM | POA: Diagnosis not present

## 2022-07-18 LAB — PROTEIN, URINE, RANDOM: Total Protein, Urine: 32 mg/dL

## 2022-07-18 LAB — CBC WITH DIFFERENTIAL/PLATELET
Abs Immature Granulocytes: 0.01 10*3/uL (ref 0.00–0.07)
Basophils Absolute: 0 10*3/uL (ref 0.0–0.1)
Basophils Relative: 1 %
Eosinophils Absolute: 0.1 10*3/uL (ref 0.0–0.5)
Eosinophils Relative: 2 %
HCT: 36.9 % — ABNORMAL LOW (ref 39.0–52.0)
Hemoglobin: 11.9 g/dL — ABNORMAL LOW (ref 13.0–17.0)
Immature Granulocytes: 0 %
Lymphocytes Relative: 35 %
Lymphs Abs: 1.8 10*3/uL (ref 0.7–4.0)
MCH: 30.1 pg (ref 26.0–34.0)
MCHC: 32.2 g/dL (ref 30.0–36.0)
MCV: 93.4 fL (ref 80.0–100.0)
Monocytes Absolute: 0.5 10*3/uL (ref 0.1–1.0)
Monocytes Relative: 10 %
Neutro Abs: 2.6 10*3/uL (ref 1.7–7.7)
Neutrophils Relative %: 52 %
Platelets: 189 10*3/uL (ref 150–400)
RBC: 3.95 MIL/uL — ABNORMAL LOW (ref 4.22–5.81)
RDW: 15 % (ref 11.5–15.5)
WBC: 5 10*3/uL (ref 4.0–10.5)
nRBC: 0 % (ref 0.0–0.2)

## 2022-07-18 LAB — COMPREHENSIVE METABOLIC PANEL
ALT: 16 U/L (ref 0–44)
AST: 35 U/L (ref 15–41)
Albumin: 3.6 g/dL (ref 3.5–5.0)
Alkaline Phosphatase: 41 U/L (ref 38–126)
Anion gap: 8 (ref 5–15)
BUN: 17 mg/dL (ref 8–23)
CO2: 24 mmol/L (ref 22–32)
Calcium: 9.3 mg/dL (ref 8.9–10.3)
Chloride: 105 mmol/L (ref 98–111)
Creatinine, Ser: 0.78 mg/dL (ref 0.61–1.24)
GFR, Estimated: >60 mL/min (ref >60)
Glucose, Bld: 113 mg/dL — ABNORMAL HIGH (ref 70–99)
Potassium: 3.8 mmol/L (ref 3.5–5.1)
Sodium: 137 mmol/L (ref 135–145)
Total Bilirubin: 1 mg/dL (ref 0.3–1.2)
Total Protein: 6.6 g/dL (ref 6.5–8.1)

## 2022-07-18 MED ORDER — FLUOROURACIL CHEMO INJECTION 2.5 GM/50ML
400.0000 mg/m2 | Freq: Once | INTRAVENOUS | Status: AC
  Administered 2022-07-18: 1000 mg via INTRAVENOUS
  Filled 2022-07-18: qty 20

## 2022-07-18 MED ORDER — SODIUM CHLORIDE 0.9 % IV SOLN
Freq: Once | INTRAVENOUS | Status: AC
  Filled 2022-07-18: qty 250

## 2022-07-18 MED ORDER — SODIUM CHLORIDE 0.9 % IV SOLN
10.0000 mg | Freq: Once | INTRAVENOUS | Status: AC
  Administered 2022-07-18: 10 mg via INTRAVENOUS
  Filled 2022-07-18: qty 10

## 2022-07-18 MED ORDER — SODIUM CHLORIDE 0.9 % IV SOLN
400.0000 mg/m2 | Freq: Once | INTRAVENOUS | Status: AC
  Administered 2022-07-18: 916 mg via INTRAVENOUS
  Filled 2022-07-18: qty 45.8

## 2022-07-18 MED ORDER — SODIUM CHLORIDE 0.9 % IV SOLN
5.0000 mg/kg | Freq: Once | INTRAVENOUS | Status: AC
  Administered 2022-07-18: 500 mg via INTRAVENOUS
  Filled 2022-07-18: qty 20

## 2022-07-18 MED ORDER — SODIUM CHLORIDE 0.9 % IV SOLN
2400.0000 mg/m2 | INTRAVENOUS | Status: DC
  Administered 2022-07-18: 5000 mg via INTRAVENOUS
  Filled 2022-07-18: qty 100

## 2022-07-18 NOTE — Assessment & Plan Note (Addendum)
Overall he tolerates chemotherapy.   CEA is relatively stable with small fluctuations. CT images were reviewed and findings were discussed.  Small lung nodules, not able to further characterize due to size.  Will repeat CT in 3 months Labs reviewed and discussed with patient Proceed with 5-FU/bevacizumab treatment today Continue current regimen.

## 2022-07-18 NOTE — Assessment & Plan Note (Signed)
Recommend urology evaluation, refer to urology

## 2022-07-18 NOTE — Assessment & Plan Note (Signed)
Mild anemia. continue to monitor.  Hemoglobin is close to baseline.

## 2022-07-18 NOTE — Progress Notes (Signed)
Nutrition Follow-up:  Patient with metastatic colon cancer.  S/p transverse colectomy (2015).  Patient receiving 5 FU-bevacizumab.  Met with patient during infusion.  Reports that he is eating but not as much as he use too.    Having trouble with constipation.  Yesterday able to eat granola bar, sausage, coffee and ensure for breakfast.  Snacked on peanut butter crackers during the day.  Dinner last night was vegetable soup.  Usually drinks 1-2 ensure shakes a day.      Medications: senna, miralax  Labs: glucose 113  Anthropometrics:   Weight 238 lb 3/5 243 lb 12.8 oz on 10/31 244 lb on 10/3 240 lb 8/22   NUTRITION DIAGNOSIS: Unintentional weight loss ongoing   INTERVENTION:  Continue oral nutrition supplement Discussed strategies to help with constipation.  Handout provided     MONITORING, EVALUATION, GOAL: weight trends, intake   NEXT VISIT: as needed  Jared Gutierrez B. Zenia Resides, [place], [place] Registered Dietitian [phone]

## 2022-07-18 NOTE — Assessment & Plan Note (Signed)
Chemotherapy plan as listed above.  

## 2022-07-18 NOTE — Patient Instructions (Signed)
[facility] [place] [facility]  Discharge Instructions: Thank you for choosing [facility] to provide your oncology and hematology care.  If you have a lab appointment with the Cancer Center, please go directly to the Cancer Center and check in at the registration area.  Wear comfortable clothing and clothing appropriate for easy access to any Portacath or PICC line.   We strive to give you quality time with your provider. You may need to reschedule your appointment if you arrive late (15 or more minutes).  Arriving late affects you and other patients whose appointments are after yours.  Also, if you miss three or more appointments without notifying the office, you may be dismissed from the clinic at the provider's discretion.      For prescription refill requests, have your pharmacy contact our office and allow 72 hours for refills to be completed.     To help prevent nausea and vomiting after your treatment, we encourage you to take your nausea medication as directed.  BELOW ARE SYMPTOMS THAT SHOULD BE REPORTED IMMEDIATELY: *FEVER GREATER THAN 100.4 F (38 C) OR HIGHER *CHILLS OR SWEATING *NAUSEA AND VOMITING THAT IS NOT CONTROLLED WITH YOUR NAUSEA MEDICATION *UNUSUAL SHORTNESS OF BREATH *UNUSUAL BRUISING OR BLEEDING *URINARY PROBLEMS (pain or burning when urinating, or frequent urination) *BOWEL PROBLEMS (unusual diarrhea, constipation, pain near the anus) TENDERNESS IN MOUTH AND THROAT WITH OR WITHOUT PRESENCE OF ULCERS (sore throat, sores in mouth, or a toothache) UNUSUAL RASH, SWELLING OR PAIN  UNUSUAL VAGINAL DISCHARGE OR ITCHING   Items with * indicate a potential emergency and should be followed up as soon as possible or go to the Emergency Department if any problems should occur.  Please show the CHEMOTHERAPY ALERT CARD or IMMUNOTHERAPY ALERT CARD at check-in to the Emergency Department and triage nurse.  Should you have questions after your visit  or need to cancel or reschedule your appointment, please contact [facility] [place] [facility]  [phone] and follow the prompts.  Office hours are 8:00 a.m. to 4:30 p.m. Monday - Friday. Please note that voicemails left after 4:00 p.m. may not be returned until the following business day.  We are closed weekends and major holidays. You have access to a nurse at all times for urgent questions. Please call the main number to the clinic [phone] and follow the prompts.  For any non-urgent questions, you may also contact your provider using MyChart. We now offer e-Visits for anyone 18 and older to request care online for non-urgent symptoms. For details visit [email or web address].   Also download the MyChart app! Go to the app store, search "MyChart", open the app, select [facility], and log in with your MyChart username and password.    

## 2022-07-18 NOTE — Progress Notes (Signed)
Hematology/Oncology Progress note Telephone:([phone] Fax:([phone]    Chief Complaint: Jared Gutierrez is a 82 y.o. male presents for follow-up of metastatic colon cancer   ASSESSMENT & PLAN:   Cancer Staging  Cancer of transverse colon [facility]) Staging form: Colon and Rectum, [name], [name], cM1) - Signed by [name], MD on 10/26/2021   Cancer of transverse colon ([place]) Overall he tolerates chemotherapy.   CEA is relatively stable with small fluctuations. CT images were reviewed and findings were discussed.  Small lung nodules, not able to further characterize due to size.  Will repeat CT in 3 months Labs reviewed and discussed with patient Proceed with 5-FU/bevacizumab treatment today Continue current regimen.   Anemia due to chemotherapy Mild anemia. continue to monitor.  Hemoglobin is close to baseline.  Encounter for antineoplastic chemotherapy Chemotherapy plan as listed above.   Elevated PSA Recommend urology evaluation, refer to urology   Follow-up 2 weeks lab MD 5-FU/bevacizumab -   All questions were answered. The patient knows to call the clinic with any problems, questions or concerns.  [name], MD, PhD [facility] Hematology Oncology 07/18/2022     PERTINENT ONCOLOGY HISTORY Jared Gutierrez is a 82 y.o.[place] who has above oncology history reviewed by me today presented for follow up visit for management of Metastatic colon cancer. [name], patient switched care to me on 11/11/20 Extensive medical record review was performed by me  Oncology History  History of colon cancer, stage I  10/02/2014 Pathology Results   [id number] colonoscopy pathology A. CECAL POLYPS; HOT SNARE AND COLD BIOPSY:  - TUBULAR ADENOMA, MULTIPLE FRAGMENTS.  - NEGATIVE FOR HIGH-GRADE DYSPLASIA AND MALIGNANCY.   B. COLON POLYP, TRANSVERSE; COLD BIOPSY:  - TUBULAR ADENOMA.  - NEGATIVE FOR  HIGH-GRADE DYSPLASIA AND MALIGNANCY.     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  Tumor extended into the submucosa. Margins were negative. + lymphovascular invasion. 14 lymph nodes were negative. Pathologic stage was T1 N0.  TRANSVERSE COLON, RESECTION:  - INVASIVE ADENOCARCINOMA ARISING IN A 4.6 CM TUBULOVILLOUS  ADENOMA WITH HIGH GRADE DYSPLASIA (MALIGNANT POLYP).  - SEE SUMMARY BELOW.  [name]  ONCOLOGY SUMMARY: COLON AND RECTUM, RESECTION, AJCC 7TH EDITION  Specimen: transverse colon  Procedure: transverse colon resection  Tumor site: splenic flexure  Tumor size: 1.0 cm (invasive component)  Macroscopic tumor perforation: not specified  Histologic type: invasive adenocarcinoma  Histologic grade: moderately differentiated  Microscopic tumor extension: into submucosa  Margins:       Proximal margin: negative       Distal margin: negative       Circumferential (radial) or mesenteric margin:  negative       If all margins uninvolved by invasive carcinoma:       Distance of invasive carcinoma from closest margin: 7.5 cm  to distal margin  Treatment effect: not applicable  Lymph-vascular invasion: present  Perineural invasion:  not identified  Tumor deposits (discontinuous extramural extension): not  identified  Pathologic staging:       Primary tumor: pT1       Regional lymph nodes: pN0            Number of nodes examined: 14            Number of nodes involved: 0    04/02/2019 Progression   04/02/2019  PET scan  limited evealed a 2.4 x 2.3 cm (SUV 11) hypermetabolic soft tissue density caudal and anterior to the pancreatic neck favored to represent isolated peritoneal or nodal metastasis in the setting of prior transverse colonic resection (expected primary drainage). Although this was immediately adjacent to the pancreas, a fat plane was maintained, arguing strongly against a pancreatic primary. Otherwise, there was no evidence of hypermetabolic metastasis. 04/17/2019 EUS on  revealed a normal esophagus, stomach, duodenum, and pancreas.  There was a 2.4 x 2.4 cm irregular mass in the retroperitoneum adjacent to, but not involving the pancreatic neck.  FNA and core needle biopsy were performed.  Pathology revealed adenocarcinoma compatible with a metastatic lesion of colorectal origin.  Tumor cells were positive for CK20 and CDX2 and negative for CK7.  NGS: [employer] on 05/15/2019 revealed + [id number] and TP53.  Negative results included BRAF V600E, Her2, NRAS, NTRK, PD-L1 (<1%), and TMB 8.7/Mb (intermediate).  MMR testing from his colon resection on 10/16/2013 was intact with a low probability of MSI-H.   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  He received Neulasta after cycle #4 and #5 secondary to progressive leukopenia.  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  He received a truncated course of FOLFOX with cycle #11 secondary to oxaliplatin reaction.   01/14/2020 Imaging    PET revealed an interval decrease in size and FDG uptake (2.4 x 2.3 cm with SUV 11  to 2.4 x 1.7 cm with SUV 4.09) associated with the previously referenced soft tissue density caudal and anterior to the pancreatic neck suggesting treatment response. There were no new sites of FDG avid tumor.   08/31/2020 Imaging   08/31/2020 Abdomen and pelvis CT revealed increased size of the index soft tissue lesion inferior and anterior to the pancreatic neck (2.9 x 1.8 cm to 3.5 x 2.9 cm). There were similar prominent retroperitoneal lymph nodes without adenopathy by size criteria. There was no new or enlarging abdominal or pelvic lymph nodes. There were no new interval findings. There was similar circumferential wall thickening of a nondistended urinary bladder, which likely accentuated wall thickening There was hepatic steatosis and aortic atherosclerosis.   11/03/2020, PET showed recurrent peritoneal metastasis in the upper abdomen adjacent to the pancreas.  The lesion is 3.8 x 2.9 cm with SUV of 11.3. No evidence of metastatic peritoneal disease elsewhere in the abdomen pelvis.  No evidence of distant metastasis.   11/03/2020 Imaging   PET scan showed recurrent peritoneal metastasis near pancreas. Given that he has no other distant metastasis. Discussed with radiation oncology. Repeat SBRT may be considered if he does not respond well to systemic  chemotherapy.     11/26/2020 -  Chemotherapy   5-FU and bevacizumab.   02/17/2021 Imaging   02/17/2021 CT abdomen pelvis showed partial response. Mild decrease of peritoneal lesion.  Proceed with 5-FU and bevacizumab today.  Given that he is tolerating current regimen with good life quality, partial response.  Shared decision was made to hold off adding additional chemotherapy agents.   06/08/2021, CT chest abdomen pelvis with contrast showed soft tissue mass at the base of mesentery inferior to the pancreatic neck is unchanged in size.  No progressive disease was identified.   09/07/2021, CT chest abdomen pelvis with contrast showed no substantial  changes in size of the mesenteric mass, 2.6 cm.  No suspicious new lesions.  Chronic findings as detailed in the imaging report.   12/05/2021 Imaging   PET scan showed soft tissue mesenteric mass is unchanged in size.  Decreased FDG uptake compared to activity on November 03, 2020.  Fever treated disease.  No new metastatic disease.   03/21/2022 Imaging   CT chest abdomen pelvis  1. Similar size of the peripancreatic presumed peritoneal implant compared to 09/07/2021. 2. No new or progressive disease. 3. Incidental findings, including: Coronary artery atherosclerosis.Aortic Atherosclerosis (ICD10-I70.0). Prostatomegaly with chronic bladder wall thickening, suggesting outlet obstruction.        Other medical problems Chronic lower extremity edema. 12/24/2018, right lower extremity duplex negative for DVT.  Small right Baker's cyst. 08/16/2019, bilateral lower extremity duplex showed no DVT. 08/16/2019 - 08/17/2019 with right lower extremity cellulitis.  He was unable to bear weight.  He was treated with IV fluids, NSAIDs, colchicine, and broad antibiotics (vancomycin and Cefepime).  He was discharged on indomethacin x 5 days and Keflex 500 mg TID x 5 days.  10/05/2020, colonoscopy showed internal hemorrhoids.  Otherwise normal examination.  INTERVAL HISTORY RHYNE ALWAY is a 82 y.o. male who has above history reviewed by me today presents for follow up visit for management of recurrent metastatic colon cancer Patient was accompanied by wife.   Denies fever, chills, nausea, vomiting, diarrhea, chest pain, shortness of breath, abdominal pain,  He has no new complaints today    Review of Systems  Constitutional:  Negative for appetite change, chills, diaphoresis, fever and unexpected weight change.  HENT:   Negative for hearing loss, nosebleeds, sore throat and tinnitus.   Respiratory:  Negative for cough, hemoptysis and shortness of breath.   Cardiovascular:  Negative for chest pain and  palpitations.  Gastrointestinal:  Negative for abdominal pain, blood in stool, constipation, diarrhea, nausea and vomiting.  Genitourinary:  Negative for dysuria, frequency and hematuria.   Musculoskeletal:  Positive for arthralgias. Negative for back pain, myalgias and neck pain.  Skin:  Negative for itching and rash.  Neurological:  Positive for numbness. Negative for dizziness and headaches.  Hematological:  Does not bruise/bleed easily.  Psychiatric/Behavioral:  Negative for depression. The patient is not nervous/anxious.       Past Medical History:  Diagnosis Date   Arthritis    OSTEOARTHRITIS   Cancer ([place])    Cavitary lesion of lung    RIGHT LOWER LOBE   Chicken pox    Colon cancer ([place])    History of kidney stones    Hypertension    Lipoma of colon    Nephrolithiasis    Nephrolithiasis    Obesity    Shingles    Tubular adenoma of colon    multiple fragments    Past Surgical History:  Procedure Laterality  Date   COLON SURGERY     COLONOSCOPY N/A 10/02/2014   Procedure: COLONOSCOPY;  Surgeon: Josefine Class, MD;  Location[facility] ENDOSCOPY;  Service: Endoscopy;  Laterality: N/A;   COLONOSCOPY WITH PROPOFOL N/A 02/16/2017   Procedure: COLONOSCOPY WITH PROPOFOL;  Surgeon: Manya Silvas, MD;  Location: [facility] ENDOSCOPY;  Service: Endoscopy;  Laterality: N/A;   COLONOSCOPY WITH PROPOFOL N/A 10/05/2020   Procedure: COLONOSCOPY WITH PROPOFOL;  Surgeon: Lesly Rubenstein, MD;  Location: [facility] ENDOSCOPY;  Service: Endoscopy;  Laterality: N/A;   EUS N/A 04/17/2019   Procedure: FULL UPPER ENDOSCOPIC ULTRASOUND (EUS) RADIAL;  Surgeon: Holly Bodily, MD;  Location: [facility] ENDOSCOPY;  Service: Gastroenterology;  Laterality: N/A;   KIDNEY STONE SURGERY     PARTIAL COLECTOMY  10/17/2013   PORTACATH PLACEMENT Right 06/13/2019   Procedure: INSERTION PORT-A-CATH;  Surgeon: Benjamine Sprague, DO;  Location: [facility] ORS;  Service: General;  Laterality: Right;    Family History   Problem Relation Age of Onset   Cancer Mother    Breast cancer Mother    COPD Father     Social History:  reports that he has never smoked. He has never used smokeless tobacco. He reports current alcohol use. He reports that he does not use drugs.    Allergies: No Known Allergies  Current Medications: Current Outpatient Medications  Medication Sig Dispense Refill   acetaminophen (TYLENOL) 500 MG tablet Take 500 mg by mouth every 6 (six) hours as needed.     amLODipine (NORVASC) 2.5 MG tablet Take 2.5 mg by mouth daily.     aspirin EC 81 MG tablet Take 81 mg by mouth daily.     Calcium Carbonate (CALCIUM 600 PO) Take 1 tablet by mouth 2 (two) times daily.     Cholecalciferol (VITAMIN D) 125 MCG (5000 UT) CAPS Take 5,000 mg by mouth.     docusate sodium (COLACE) 100 MG capsule Take 1 capsule (100 mg total) by mouth 2 (two) times daily as needed for mild constipation or moderate constipation. 60 capsule 3   HYDROcodone-acetaminophen (NORCO/VICODIN) 5-325 MG tablet Take 1 tablet by mouth every 6 (six) hours as needed for moderate pain (up to 3 doses for moderate pain.).     lidocaine-prilocaine (EMLA) cream APPLY TO AFFECTED AREA ONCE AS DIRECTED 30 g 3   loperamide (IMODIUM) 2 MG capsule Take 1 capsule (2 mg total) by mouth See admin instructions. Take 2 tablets after first loose stool,  then 1 tablet  after each loose stool; maximum: 8 tablets /day 60 capsule 0   loratadine (CLARITIN) 10 MG tablet Take 10 mg by mouth daily.     olmesartan (BENICAR) 20 MG tablet Take 1 tablet by mouth daily.     ondansetron (ZOFRAN) 8 MG tablet Take 1 tablet (8 mg total) by mouth 2 (two) times daily as needed for refractory nausea / vomiting. Start on day 3 after chemotherapy. 60 tablet 1   potassium chloride SA (KLOR-CON M20) 20 MEQ tablet Take 1 tablet (20 mEq total) by mouth daily. 180 tablet 1   Probiotic Product (PROBIOTIC DAILY PO) Take 1 tablet by mouth daily.     prochlorperazine (COMPAZINE) 10 MG  tablet Take 1 tablet (10 mg total) by mouth every 6 (six) hours as needed (NAUSEA). 30 tablet 1   senna-docusate (SENNA S) 8.6-50 MG tablet Take 2 tablets by mouth daily. 60 tablet 3   No current facility-administered medications for this visit.   Facility-Administered Medications Ordered in Other Visits  Medication Dose Route Frequency Provider Last Rate Last Admin   0.9 %  sodium chloride infusion   Intravenous Once Corcoran, Melissa C, MD       0.9 %  sodium chloride infusion   Intravenous Continuous Lequita Asal, MD 10 mL/hr at 12/31/19 1000 New Bag at 10/05/20 1045   heparin lock flush 100 unit/mL  500 Units Intravenous Once Lequita Asal, MD        Performance status (ECOG): 1  Vitals Blood pressure 116/68, pulse 79, temperature (!) 96.9 F (36.1 C), temperature source Tympanic, weight 238 lb 1.6 oz (108 kg), SpO2 100 %.   Physical Exam Vitals and nursing note reviewed.  Constitutional:      General: He is not in acute distress.    Appearance: He is well-developed. He is obese. He is not diaphoretic.     Interventions: Face mask in place.  HENT:     Head: Normocephalic and atraumatic.  Eyes:     General: No scleral icterus.    Extraocular Movements: Extraocular movements intact.     Conjunctiva/sclera: Conjunctivae normal.     Pupils: Pupils are equal, round, and reactive to light.  Cardiovascular:     Rate and Rhythm: Normal rate and regular rhythm.     Heart sounds: Normal heart sounds. No murmur heard. Pulmonary:     Effort: Pulmonary effort is normal. No respiratory distress.     Breath sounds: No wheezing.     Comments: Decreased breath sound bilaterally Abdominal:     General: There is no distension.     Palpations: Abdomen is soft. There is no mass.     Tenderness: There is no abdominal tenderness.  Musculoskeletal:        General: No swelling or tenderness. Normal range of motion.     Cervical back: Normal range of motion and neck supple.      Comments: Trace edema bilaterally.   Lymphadenopathy:     Head:     Right side of head: No preauricular, posterior auricular or occipital adenopathy.     Left side of head: No preauricular, posterior auricular or occipital adenopathy.     Cervical: No cervical adenopathy.     Upper Body:     Right upper body: No supraclavicular or axillary adenopathy.     Left upper body: No supraclavicular or axillary adenopathy.     Lower Body: No right inguinal adenopathy. No left inguinal adenopathy.  Skin:    General: Skin is warm and dry.  Neurological:     Mental Status: He is alert and oriented to person, place, and time. Mental status is at baseline.  Psychiatric:        Mood and Affect: Mood normal.    Labs were reviewed by me.     Latest Ref Rng & Units 07/18/2022    8:26 AM 07/04/2022    8:54 AM 06/20/2022    8:43 AM  CBC  WBC 4.0 - 10.5 K/uL 5.0  5.0  4.7   Hemoglobin 13.0 - 17.0 g/dL 11.9  11.6  12.1   Hematocrit 39.0 - 52.0 % 36.9  36.2  37.0   Platelets 150 - 400 K/uL 189  168  162       Latest Ref Rng & Units 07/18/2022    8:26 AM 07/04/2022    8:54 AM 06/20/2022    8:43 AM  CMP  Glucose 70 - 99 mg/dL 113  106  134   BUN 8 - 23  mg/dL '17  10  12   '$ Creatinine 0.61 - 1.24 mg/dL 0.78  0.73  0.82   Sodium 135 - 145 mmol/L 137  137  137   Potassium 3.5 - 5.1 mmol/L 3.8  3.6  3.7   Chloride 98 - 111 mmol/L 105  104  104   CO2 22 - 32 mmol/L '24  24  23   '$ Calcium 8.9 - 10.3 mg/dL 9.3  9.1  9.3   Total Protein 6.5 - 8.1 g/dL 6.6  6.5  6.5   Total Bilirubin 0.3 - 1.2 mg/dL 1.0  1.3  0.9   Alkaline Phos 38 - 126 U/L 41  41  41   AST 15 - 41 U/L 35  32  37   ALT 0 - 44 U/L 16  16  17

## 2022-07-19 ENCOUNTER — Other Ambulatory Visit: Payer: Self-pay

## 2022-07-20 ENCOUNTER — Inpatient Hospital Stay: Payer: Medicare Other

## 2022-07-20 VITALS — BP 155/81 | HR 80 | Resp 18

## 2022-07-20 DIAGNOSIS — Z85038 Personal history of other malignant neoplasm of large intestine: Secondary | ICD-10-CM

## 2022-07-20 DIAGNOSIS — Z5112 Encounter for antineoplastic immunotherapy: Secondary | ICD-10-CM | POA: Diagnosis not present

## 2022-07-20 DIAGNOSIS — C786 Secondary malignant neoplasm of retroperitoneum and peritoneum: Secondary | ICD-10-CM

## 2022-07-20 MED ORDER — HEPARIN SOD (PORK) LOCK FLUSH 100 UNIT/ML IV SOLN
500.0000 [IU] | Freq: Once | INTRAVENOUS | Status: AC | PRN
  Administered 2022-07-20: 500 [IU]
  Filled 2022-07-20: qty 5

## 2022-07-20 MED ORDER — SODIUM CHLORIDE 0.9% FLUSH
10.0000 mL | INTRAVENOUS | Status: DC | PRN
  Administered 2022-07-20: 10 mL
  Filled 2022-07-20: qty 10

## 2022-07-26 ENCOUNTER — Encounter: Payer: Self-pay | Admitting: Urology

## 2022-07-26 ENCOUNTER — Ambulatory Visit (INDEPENDENT_AMBULATORY_CARE_PROVIDER_SITE_OTHER): Payer: Medicare Other | Admitting: Urology

## 2022-07-26 VITALS — BP 107/71 | HR 79 | Ht 68.0 in | Wt 234.0 lb

## 2022-07-26 DIAGNOSIS — R972 Elevated prostate specific antigen [PSA]: Secondary | ICD-10-CM | POA: Diagnosis not present

## 2022-07-26 DIAGNOSIS — R3912 Poor urinary stream: Secondary | ICD-10-CM | POA: Diagnosis not present

## 2022-07-26 DIAGNOSIS — R351 Nocturia: Secondary | ICD-10-CM

## 2022-07-26 DIAGNOSIS — N401 Enlarged prostate with lower urinary tract symptoms: Secondary | ICD-10-CM

## 2022-07-26 NOTE — Progress Notes (Signed)
Haze Rushing Plume,acting as a scribe for Hollice Espy, MD.,have documented all relevant documentation on the behalf of Hollice Espy, MD,as directed by  Hollice Espy, MD while in the presence of Hollice Espy, MD.  07/26/2022 10:15 AM   Jared Gutierrez [birth year] [id number]  Referring provider: [name], MD [place],  [place] [address]  Chief Complaint  Patient presents with   Establish Care   Elevated PSA    HPI: 82 year-old male referred for further evaluation of elevated PSA. He had a PSA drawn by Dr. Tasia Catchings on 01/03/2022, which was noted to be elevated to 9.56. It has not since been repeated. He is being followed by her for a personal history of metastatic colorectal cancer. He is currently being treated with 5FU and Bevacizumab. His primary care is Dr. Ginette Pitman. He last had a PSA check in 07/2020, which fell within the normal range of 3.92.   Today, he reports an intermittent weak stream. He also note nocturia. He states that he hydrates really well.   PMH: Past Medical History:  Diagnosis Date   Arthritis    OSTEOARTHRITIS   Cancer ([place])    Cavitary lesion of lung    RIGHT LOWER LOBE   Chicken pox    Colon cancer ([place])    History of kidney stones    Hypertension    Lipoma of colon    Nephrolithiasis    Nephrolithiasis    Obesity    Shingles    Tubular adenoma of colon    multiple fragments    Surgical History: Past Surgical History:  Procedure Laterality Date   COLON SURGERY     COLONOSCOPY N/A 10/02/2014   Procedure: COLONOSCOPY;  Surgeon: Josefine Class, MD;  Location: [facility] ENDOSCOPY;  Service: Endoscopy;  Laterality: N/A;   COLONOSCOPY WITH PROPOFOL N/A 02/16/2017   Procedure: COLONOSCOPY WITH PROPOFOL;  Surgeon: Manya Silvas, MD;  Location: [facility] ENDOSCOPY;  Service: Endoscopy;  Laterality: N/A;   COLONOSCOPY WITH PROPOFOL N/A 10/05/2020   Procedure: COLONOSCOPY WITH PROPOFOL;  Surgeon: Lesly Rubenstein, MD;  Location: [facility]  ENDOSCOPY;  Service: Endoscopy;  Laterality: N/A;   EUS N/A 04/17/2019   Procedure: FULL UPPER ENDOSCOPIC ULTRASOUND (EUS) RADIAL;  Surgeon: Holly Bodily, MD;  Location: [facility] ENDOSCOPY;  Service: Gastroenterology;  Laterality: N/A;   KIDNEY STONE SURGERY     PARTIAL COLECTOMY  10/17/2013   PORTACATH PLACEMENT Right 06/13/2019   Procedure: INSERTION PORT-A-CATH;  Surgeon: Benjamine Sprague, DO;  Location: [facility] ORS;  Service: General;  Laterality: Right;    Home Medications:  Allergies as of 07/26/2022   No Known Allergies      Medication List        Accurate as of July 26, 2022 10:16 AM. If you have any questions, ask your nurse or doctor.          acetaminophen 500 MG tablet Commonly known as: TYLENOL Take 500 mg by mouth every 6 (six) hours as needed.   amLODipine 2.5 MG tablet Commonly known as: NORVASC Take 2.5 mg by mouth daily.   aspirin EC 81 MG tablet Take 81 mg by mouth daily.   CALCIUM 600 PO Take 1 tablet by mouth 2 (two) times daily.   docusate sodium 100 MG capsule Commonly known as: Colace Take 1 capsule (100 mg total) by mouth 2 (two) times daily as needed for mild constipation or moderate constipation.   HYDROcodone-acetaminophen 5-325 MG tablet Commonly known as: NORCO/VICODIN Take 1  tablet by mouth every 6 (six) hours as needed for moderate pain (up to 3 doses for moderate pain.).   lidocaine-prilocaine cream Commonly known as: EMLA APPLY TO AFFECTED AREA ONCE AS DIRECTED   loperamide 2 MG capsule Commonly known as: IMODIUM Take 1 capsule (2 mg total) by mouth See admin instructions. Take 2 tablets after first loose stool,  then 1 tablet  after each loose stool; maximum: 8 tablets /day   loratadine 10 MG tablet Commonly known as: CLARITIN Take 10 mg by mouth daily.   olmesartan 20 MG tablet Commonly known as: BENICAR Take 1 tablet by mouth daily.   ondansetron 8 MG tablet Commonly known as: ZOFRAN Take 1 tablet (8 mg total) by mouth 2  (two) times daily as needed for refractory nausea / vomiting. Start on day 3 after chemotherapy.   potassium chloride SA 20 MEQ tablet Commonly known as: [place]-Con M20 Take 1 tablet (20 mEq total) by mouth daily.   PROBIOTIC DAILY PO Take 1 tablet by mouth daily.   prochlorperazine 10 MG tablet Commonly known as: COMPAZINE Take 1 tablet (10 mg total) by mouth every 6 (six) hours as needed (NAUSEA).   senna-docusate 8.6-50 MG tablet Commonly known as: Senna S Take 2 tablets by mouth daily.   Vitamin D 125 MCG (5000 UT) Caps Take 5,000 mg by mouth.        Family History: Family History  Problem Relation Age of Onset   Cancer Mother    Breast cancer Mother    COPD Father     Social History:  reports that he has never smoked. He has never used smokeless tobacco. He reports current alcohol use. He reports that he does not use drugs.   Physical Exam: BP 107/71   Pulse 79   Ht '5\' 8"'$  (1.727 m)   Wt 234 lb (106.1 kg)   BMI 35.58 kg/m   Constitutional:  Alert and oriented, No acute distress. HEENT: [place] AT, moist mucus membranes.  Trachea midline, no masses. Neurologic: Grossly intact, no focal deficits, moving all 4 extremities. Psychiatric: Normal mood and affect.  Assessment & Plan:    1. Elevated PSA - Based on his additional comorbidities including metastatic colorectal cancer, further diagnostic workup including biopsy and treatment may not be warranted.  - We discussed the screening guidelines today.  - Plan to recheck a PSA today and make some decisions regarding whether or not to intervene. If his PSA normalizes, we would not recommend repeating it. If his PSA is upward trending, we will consider further workup based on PSA doubling time.   Return for discussion of PSA results.   [facility] [address], [place],  [address] [phone]

## 2022-07-27 ENCOUNTER — Telehealth: Payer: Self-pay

## 2022-07-27 DIAGNOSIS — R972 Elevated prostate specific antigen [PSA]: Secondary | ICD-10-CM

## 2022-07-27 DIAGNOSIS — N401 Enlarged prostate with lower urinary tract symptoms: Secondary | ICD-10-CM

## 2022-07-27 LAB — PSA: Prostate Specific Ag, Serum: 10.3 ng/mL — ABNORMAL HIGH (ref 0.0–4.0)

## 2022-07-27 NOTE — Telephone Encounter (Signed)
-----   Message from Hollice Espy, MD sent at 07/27/2022  8:17 AM EDT ----- PSA is relatively stable.  Lets plan on repeat PSA in 6 mo.    Hollice Espy, MD

## 2022-07-27 NOTE — Telephone Encounter (Signed)
Left message to call back and to schedule lab visit

## 2022-07-31 MED FILL — Dexamethasone Sodium Phosphate Inj 100 MG/10ML: INTRAMUSCULAR | Qty: 1 | Status: AC

## 2022-07-31 NOTE — Telephone Encounter (Signed)
Left detailed message on patient's voicemail-ok per DPR on file

## 2022-08-01 ENCOUNTER — Encounter: Payer: Self-pay | Admitting: Oncology

## 2022-08-01 ENCOUNTER — Inpatient Hospital Stay (HOSPITAL_BASED_OUTPATIENT_CLINIC_OR_DEPARTMENT_OTHER): Payer: Medicare Other | Admitting: Oncology

## 2022-08-01 ENCOUNTER — Inpatient Hospital Stay: Payer: Medicare Other

## 2022-08-01 VITALS — BP 124/67 | HR 68 | Temp 97.4°F | Resp 18 | Wt 230.9 lb

## 2022-08-01 DIAGNOSIS — T451X5D Adverse effect of antineoplastic and immunosuppressive drugs, subsequent encounter: Secondary | ICD-10-CM

## 2022-08-01 DIAGNOSIS — Z5111 Encounter for antineoplastic chemotherapy: Secondary | ICD-10-CM

## 2022-08-01 DIAGNOSIS — Z85038 Personal history of other malignant neoplasm of large intestine: Secondary | ICD-10-CM

## 2022-08-01 DIAGNOSIS — C786 Secondary malignant neoplasm of retroperitoneum and peritoneum: Secondary | ICD-10-CM

## 2022-08-01 DIAGNOSIS — C184 Malignant neoplasm of transverse colon: Secondary | ICD-10-CM | POA: Diagnosis not present

## 2022-08-01 DIAGNOSIS — R972 Elevated prostate specific antigen [PSA]: Secondary | ICD-10-CM | POA: Diagnosis not present

## 2022-08-01 DIAGNOSIS — Z5112 Encounter for antineoplastic immunotherapy: Secondary | ICD-10-CM | POA: Diagnosis not present

## 2022-08-01 DIAGNOSIS — D6481 Anemia due to antineoplastic chemotherapy: Secondary | ICD-10-CM

## 2022-08-01 LAB — COMPREHENSIVE METABOLIC PANEL
ALT: 15 U/L (ref 0–44)
AST: 39 U/L (ref 15–41)
Albumin: 3.5 g/dL (ref 3.5–5.0)
Alkaline Phosphatase: 43 U/L (ref 38–126)
Anion gap: 9 (ref 5–15)
BUN: 12 mg/dL (ref 8–23)
CO2: 24 mmol/L (ref 22–32)
Calcium: 9.3 mg/dL (ref 8.9–10.3)
Chloride: 102 mmol/L (ref 98–111)
Creatinine, Ser: 0.78 mg/dL (ref 0.61–1.24)
GFR, Estimated: >60 mL/min (ref >60)
Glucose, Bld: 124 mg/dL — ABNORMAL HIGH (ref 70–99)
Potassium: 3.6 mmol/L (ref 3.5–5.1)
Sodium: 135 mmol/L (ref 135–145)
Total Bilirubin: 1.2 mg/dL (ref 0.3–1.2)
Total Protein: 6.6 g/dL (ref 6.5–8.1)

## 2022-08-01 LAB — CBC WITH DIFFERENTIAL/PLATELET
Abs Immature Granulocytes: 0 10*3/uL (ref 0.00–0.07)
Basophils Absolute: 0 10*3/uL (ref 0.0–0.1)
Basophils Relative: 1 %
Eosinophils Absolute: 0.1 10*3/uL (ref 0.0–0.5)
Eosinophils Relative: 2 %
HCT: 36.4 % — ABNORMAL LOW (ref 39.0–52.0)
Hemoglobin: 11.9 g/dL — ABNORMAL LOW (ref 13.0–17.0)
Immature Granulocytes: 0 %
Lymphocytes Relative: 38 %
Lymphs Abs: 1.7 10*3/uL (ref 0.7–4.0)
MCH: 30.3 pg (ref 26.0–34.0)
MCHC: 32.7 g/dL (ref 30.0–36.0)
MCV: 92.6 fL (ref 80.0–100.0)
Monocytes Absolute: 0.4 10*3/uL (ref 0.1–1.0)
Monocytes Relative: 9 %
Neutro Abs: 2.3 10*3/uL (ref 1.7–7.7)
Neutrophils Relative %: 50 %
Platelets: 177 10*3/uL (ref 150–400)
RBC: 3.93 MIL/uL — ABNORMAL LOW (ref 4.22–5.81)
RDW: 14.8 % (ref 11.5–15.5)
WBC: 4.5 10*3/uL (ref 4.0–10.5)
nRBC: 0 % (ref 0.0–0.2)

## 2022-08-01 LAB — PROTEIN, URINE, RANDOM: Total Protein, Urine: 29 mg/dL

## 2022-08-01 MED ORDER — SODIUM CHLORIDE 0.9 % IV SOLN
400.0000 mg/m2 | Freq: Once | INTRAVENOUS | Status: AC
  Administered 2022-08-01: 916 mg via INTRAVENOUS
  Filled 2022-08-01: qty 45.8

## 2022-08-01 MED ORDER — SODIUM CHLORIDE 0.9 % IV SOLN
Freq: Once | INTRAVENOUS | Status: AC
  Filled 2022-08-01: qty 250

## 2022-08-01 MED ORDER — FLUOROURACIL CHEMO INJECTION 2.5 GM/50ML
400.0000 mg/m2 | Freq: Once | INTRAVENOUS | Status: AC
  Administered 2022-08-01: 1000 mg via INTRAVENOUS
  Filled 2022-08-01: qty 20

## 2022-08-01 MED ORDER — SODIUM CHLORIDE 0.9 % IV SOLN
5.0000 mg/kg | Freq: Once | INTRAVENOUS | Status: AC
  Administered 2022-08-01: 500 mg via INTRAVENOUS
  Filled 2022-08-01: qty 20

## 2022-08-01 MED ORDER — SODIUM CHLORIDE 0.9 % IV SOLN
10.0000 mg | Freq: Once | INTRAVENOUS | Status: AC
  Administered 2022-08-01: 10 mg via INTRAVENOUS
  Filled 2022-08-01: qty 10

## 2022-08-01 MED ORDER — SODIUM CHLORIDE 0.9 % IV SOLN
2400.0000 mg/m2 | INTRAVENOUS | Status: DC
  Administered 2022-08-01: 5000 mg via INTRAVENOUS
  Filled 2022-08-01: qty 100

## 2022-08-01 NOTE — Progress Notes (Signed)
Hematology/Oncology Progress note [phone] [phone]    Chief Complaint: Jared Gutierrez is a 82 y.o. male presents for follow-up of metastatic colon cancer   ASSESSMENT & PLAN:   Cancer Staging  Cancer of transverse colon [facility]) Staging form: Colon and Rectum, [name], [name], cM1) - Signed by [name], MD on 10/26/2021   Cancer of transverse colon ([place]) Overall he tolerates chemotherapy.   CEA is relatively stable with small fluctuations. CT images were reviewed and findings were discussed.  Small lung nodules, not able to further characterize due to size.  Will repeat CT in 3 months Labs reviewed and discussed with patient Proceed with 5-FU/bevacizumab treatment today Continue current regimen.   Anemia due to chemotherapy Mild anemia. continue to monitor.  Hemoglobin is close to baseline.  Elevated PSA Follow up with urology  Encounter for antineoplastic chemotherapy Chemotherapy plan as listed above.    Follow-up 2 weeks lab MD 5-FU/bevacizumab -   All questions were answered. The patient knows to call the clinic with any problems, questions or concerns.  [name], MD, PhD [facility] Hematology Oncology 08/01/2022     PERTINENT ONCOLOGY HISTORY Jared Gutierrez is a 82 y.o.[place] who has above oncology history reviewed by me today presented for follow up visit for management of Metastatic colon cancer. [name], patient switched care to me on 11/11/20 Extensive medical record review was performed by me  Oncology History  History of colon cancer, stage I  10/02/2014 Pathology Results   [id number] colonoscopy pathology A. CECAL POLYPS; HOT SNARE AND COLD BIOPSY:  - TUBULAR ADENOMA, MULTIPLE FRAGMENTS.  - NEGATIVE FOR HIGH-GRADE DYSPLASIA AND MALIGNANCY.   B. COLON POLYP, TRANSVERSE; COLD BIOPSY:  - TUBULAR ADENOMA.  - NEGATIVE FOR HIGH-GRADE DYSPLASIA AND  MALIGNANCY.     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  Tumor extended into the submucosa. Margins were negative. + lymphovascular invasion. 14 lymph nodes were negative. Pathologic stage was T1 N0.  TRANSVERSE COLON, RESECTION:  - INVASIVE ADENOCARCINOMA ARISING IN A 4.6 CM TUBULOVILLOUS  ADENOMA WITH HIGH GRADE DYSPLASIA (MALIGNANT POLYP).  - SEE SUMMARY BELOW.  [name]  ONCOLOGY SUMMARY: COLON AND RECTUM, RESECTION, AJCC 7TH EDITION  Specimen: transverse colon  Procedure: transverse colon resection  Tumor site: splenic flexure  Tumor size: 1.0 cm (invasive component)  Macroscopic tumor perforation: not specified  Histologic type: invasive adenocarcinoma  Histologic grade: moderately differentiated  Microscopic tumor extension: into submucosa  Margins:       Proximal margin: negative       Distal margin: negative       Circumferential (radial) or mesenteric margin: negative  If all margins uninvolved by invasive carcinoma:       Distance of invasive carcinoma from closest margin: 7.5 cm  to distal margin  Treatment effect: not applicable  Lymph-vascular invasion: present  Perineural invasion: not identified  Tumor  deposits (discontinuous extramural extension): not  identified  Pathologic staging:       Primary tumor: pT1       Regional lymph nodes: pN0            Number of nodes examined: 14            Number of nodes involved: 0    04/02/2019 Progression   04/02/2019  PET scan  limited evealed a 2.4 x 2.3 cm (SUV 11) hypermetabolic soft tissue density caudal and anterior to the pancreatic neck favored to represent isolated peritoneal or nodal metastasis in the setting of prior transverse colonic resection (expected primary drainage). Although this was immediately adjacent to the pancreas, a fat plane was maintained, arguing strongly against a pancreatic primary. Otherwise, there was no evidence of hypermetabolic metastasis. 04/17/2019 EUS on  revealed a normal esophagus, stomach, duodenum, and pancreas.  There was a 2.4 x 2.4 cm irregular mass in the retroperitoneum adjacent to, but not involving the pancreatic neck.  FNA and core needle biopsy were performed.  Pathology revealed adenocarcinoma compatible with a metastatic lesion of colorectal origin.  Tumor cells were positive for CK20 and CDX2 and negative for CK7.  NGS: [employer] on 05/15/2019 revealed + KRASG13D and TP53.  Negative results included BRAF V600E, Her2, NRAS, NTRK, PD-L1 (<1%), and TMB 8.7/Mb (intermediate).  MMR testing from his colon resection on 10/16/2013 was intact with a low probability of MSI-H.   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  He received Neulasta after cycle #4 and #5 secondary to progressive leukopenia.  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  He received a truncated course of FOLFOX with cycle #11 secondary to oxaliplatin reaction.   01/14/2020 Imaging    PET revealed an interval decrease in size and FDG uptake (2.4 x 2.3 cm with SUV 11 to 2.4 x 1.7 cm with  SUV 4.09) associated with the previously referenced soft tissue density caudal and anterior to the pancreatic neck suggesting treatment response. There were no new sites of FDG avid tumor.   08/31/2020 Imaging   08/31/2020 Abdomen and pelvis CT revealed increased size of the index soft tissue lesion inferior and anterior to the pancreatic neck (2.9 x 1.8 cm to 3.5 x 2.9 cm). There were similar prominent retroperitoneal lymph nodes without adenopathy by size criteria. There was no new or enlarging abdominal or pelvic lymph nodes. There were no new interval findings. There was similar circumferential wall thickening of a nondistended urinary bladder, which likely accentuated wall thickening There was hepatic steatosis and aortic atherosclerosis.   11/03/2020, PET showed recurrent peritoneal metastasis in the upper abdomen adjacent to the pancreas.  The lesion is 3.8 x 2.9 cm with SUV of 11.3. No evidence of metastatic peritoneal disease elsewhere in the abdomen pelvis.  No evidence of distant metastasis.   11/03/2020 Imaging   PET scan showed recurrent peritoneal metastasis near pancreas. Given that he has no other distant metastasis. Discussed with radiation oncology. Repeat SBRT may be considered if he does not respond well to systemic chemotherapy.     11/26/2020 -  Chemotherapy   5-FU and bevacizumab.   02/17/2021 Imaging   02/17/2021 CT abdomen pelvis showed partial response. Mild decrease of peritoneal lesion.  Proceed with 5-FU and bevacizumab today.  Given that he is tolerating current regimen with good life quality, partial response.  Shared decision was made to hold off adding additional chemotherapy agents.   06/08/2021, CT chest abdomen pelvis with contrast showed soft tissue mass at the base of mesentery inferior to the pancreatic neck is unchanged in size.  No progressive disease was identified.   09/07/2021, CT chest abdomen pelvis with contrast showed no substantial changes in size of  the mesenteric mass, 2.6 cm.  No suspicious new lesions.  Chronic findings as detailed in the imaging report.   12/05/2021 Imaging   PET scan showed soft tissue mesenteric mass is unchanged in size.  Decreased FDG uptake compared to activity on November 03, 2020.  Fever treated disease.  No new metastatic disease.   03/21/2022 Imaging   CT chest abdomen pelvis  1. Similar size of the peripancreatic presumed peritoneal implant compared to 09/07/2021. 2. No new or progressive disease. 3. Incidental findings, including: Coronary artery atherosclerosis.Aortic Atherosclerosis (ICD10-I70.0). Prostatomegaly with chronic bladder wall thickening, suggesting outlet obstruction.     07/11/2022 Imaging   CT chest abdomen pelvis with contrast showed 1. New 6 mm nodule in the right upper lobe and 2 mm nodule in the left upper lobe. Although small these are concerning. Recommend short follow-up in 3 months 2. Stable low-attenuation lesion along the midbody of the pancreas. Continued attention on follow-up. No new peritoneal mass lesions or ascites. 3. Fatty liver infiltration 4. Colonic surgical changes      Other medical problems Chronic lower extremity edema. 12/24/2018, right lower extremity duplex negative for DVT.  Small right Baker's cyst. 08/16/2019, bilateral lower extremity duplex showed no DVT. 08/16/2019 - 08/17/2019 with right lower extremity cellulitis.  He was unable to bear weight.  He was treated with IV fluids, NSAIDs, colchicine, and broad antibiotics (vancomycin and Cefepime).  He was discharged on indomethacin x 5 days and Keflex 500 mg TID x 5 days.  10/05/2020, colonoscopy showed internal hemorrhoids.  Otherwise normal examination.  INTERVAL HISTORY LUKEN PHENIS is a 82 y.o. male who has above history reviewed by me today presents for follow up visit for management of recurrent metastatic colon cancer Patient was accompanied by wife.   Denies fever, chills, nausea, vomiting, diarrhea,  chest pain, shortness of breath, abdominal pain,  He has no new complaints today    Review of Systems  Constitutional:  Negative for appetite change, chills, diaphoresis, fever and unexpected weight change.  HENT:   Negative for hearing loss, nosebleeds, sore throat and tinnitus.   Respiratory:  Negative for cough, hemoptysis and shortness of breath.   Cardiovascular:  Negative for chest pain and palpitations.  Gastrointestinal:  Negative for abdominal pain, blood in stool, constipation, diarrhea, nausea and vomiting.  Genitourinary:  Negative for dysuria, frequency and hematuria.   Musculoskeletal:  Positive for arthralgias. Negative for back pain, myalgias and neck pain.  Skin:  Negative for itching and rash.  Neurological:  Positive for numbness. Negative for dizziness and headaches.  Hematological:  Does not bruise/bleed easily.  Psychiatric/Behavioral:  Negative for depression. The patient is not nervous/anxious.       Past Medical History:  Diagnosis Date   Arthritis    OSTEOARTHRITIS   Cancer ([place])    Cavitary lesion of lung    RIGHT LOWER LOBE  Chicken pox    Colon cancer ([place])    History of kidney stones    Hypertension    Lipoma of colon    Nephrolithiasis    Nephrolithiasis    Obesity    Shingles    Tubular adenoma of colon    multiple fragments    Past Surgical History:  Procedure Laterality Date   COLON SURGERY     COLONOSCOPY N/A 10/02/2014   Procedure: COLONOSCOPY;  Surgeon: Josefine Class, MD;  Location: [facility] ENDOSCOPY;  Service: Endoscopy;  Laterality: N/A;   COLONOSCOPY WITH PROPOFOL N/A 02/16/2017   Procedure: COLONOSCOPY WITH PROPOFOL;  Surgeon: Manya Silvas, MD;  Location: [facility] ENDOSCOPY;  Service: Endoscopy;  Laterality: N/A;   COLONOSCOPY WITH PROPOFOL N/A 10/05/2020   Procedure: COLONOSCOPY WITH PROPOFOL;  Surgeon: Lesly Rubenstein, MD;  Location: [facility] ENDOSCOPY;  Service: Endoscopy;  Laterality: N/A;   EUS N/A 04/17/2019    Procedure: FULL UPPER ENDOSCOPIC ULTRASOUND (EUS) RADIAL;  Surgeon: Holly Bodily, MD;  Location: [facility] ENDOSCOPY;  Service: Gastroenterology;  Laterality: N/A;   KIDNEY STONE SURGERY     PARTIAL COLECTOMY  10/17/2013   PORTACATH PLACEMENT Right 06/13/2019   Procedure: INSERTION PORT-A-CATH;  Surgeon: Benjamine Sprague, DO;  Location: [facility] ORS;  Service: General;  Laterality: Right;    Family History  Problem Relation Age of Onset   Cancer Mother    Breast cancer Mother    COPD Father     Social History:  reports that he has never smoked. He has never used smokeless tobacco. He reports current alcohol use. He reports that he does not use drugs.    Allergies: No Known Allergies  Current Medications: Current Outpatient Medications  Medication Sig Dispense Refill   acetaminophen (TYLENOL) 500 MG tablet Take 500 mg by mouth every 6 (six) hours as needed.     amLODipine (NORVASC) 2.5 MG tablet Take 2.5 mg by mouth daily.     aspirin EC 81 MG tablet Take 81 mg by mouth daily.     Calcium Carbonate (CALCIUM 600 PO) Take 1 tablet by mouth 2 (two) times daily.     Cholecalciferol (VITAMIN D) 125 MCG (5000 UT) CAPS Take 5,000 mg by mouth.     docusate sodium (COLACE) 100 MG capsule Take 1 capsule (100 mg total) by mouth 2 (two) times daily as needed for mild constipation or moderate constipation. 60 capsule 3   HYDROcodone-acetaminophen (NORCO/VICODIN) 5-325 MG tablet Take 1 tablet by mouth every 6 (six) hours as needed for moderate pain (up to 3 doses for moderate pain.).     lidocaine-prilocaine (EMLA) cream APPLY TO AFFECTED AREA ONCE AS DIRECTED 30 g 3   loperamide (IMODIUM) 2 MG capsule Take 1 capsule (2 mg total) by mouth See admin instructions. Take 2 tablets after first loose stool,  then 1 tablet  after each loose stool; maximum: 8 tablets /day 60 capsule 0   loratadine (CLARITIN) 10 MG tablet Take 10 mg by mouth daily.     olmesartan (BENICAR) 20 MG tablet Take 1 tablet by mouth daily.      ondansetron (ZOFRAN) 8 MG tablet Take 1 tablet (8 mg total) by mouth 2 (two) times daily as needed for refractory nausea / vomiting. Start on day 3 after chemotherapy. 60 tablet 1   potassium chloride SA (KLOR-CON M20) 20 MEQ tablet Take 1 tablet (20 mEq total) by mouth daily. 180 tablet 1   Probiotic Product (PROBIOTIC DAILY PO) Take 1 tablet by mouth  daily.     prochlorperazine (COMPAZINE) 10 MG tablet Take 1 tablet (10 mg total) by mouth every 6 (six) hours as needed (NAUSEA). 30 tablet 1   senna-docusate (SENNA S) 8.6-50 MG tablet Take 2 tablets by mouth daily. 60 tablet 3   No current facility-administered medications for this visit.   Facility-Administered Medications Ordered in Other Visits  Medication Dose Route Frequency Provider Last Rate Last Admin   0.9 %  sodium chloride infusion   Intravenous Once Corcoran, Melissa C, MD       0.9 %  sodium chloride infusion   Intravenous Continuous Lequita Asal, MD 10 mL/hr at 12/31/19 1000 New Bag at 10/05/20 1045   bevacizumab-adcd (VEGZELMA) 500 mg in sodium chloride 0.9 % 100 mL chemo infusion  5 mg/kg (Treatment Plan Recorded) Intravenous Once [name], MD       fluorouracil (ADRUCIL) 5,000 mg in sodium chloride 0.9 % 150 mL chemo infusion  2,400 mg/m2 (Treatment Plan Recorded) Intravenous 1 day or 1 dose [name], MD       fluorouracil (ADRUCIL) chemo injection 1,000 mg  400 mg/m2 (Treatment Plan Recorded) Intravenous Once [name], MD       heparin lock flush 100 unit/mL  500 Units Intravenous Once Lequita Asal, MD        Performance status (ECOG): 1  Vitals Blood pressure 124/67, pulse 68, temperature (!) 97.4 F (36.3 C), resp. rate 18, weight 230 lb 14.4 oz (104.7 kg).   Physical Exam Vitals and nursing note reviewed.  Constitutional:      General: He is not in acute distress.    Appearance: He is well-developed. He is obese. He is not diaphoretic.     Interventions: Face mask in place.  HENT:     Head:  Normocephalic and atraumatic.  Eyes:     General: No scleral icterus.    Extraocular Movements: Extraocular movements intact.     Conjunctiva/sclera: Conjunctivae normal.     Pupils: Pupils are equal, round, and reactive to light.  Cardiovascular:     Rate and Rhythm: Normal rate and regular rhythm.     Heart sounds: Normal heart sounds. No murmur heard. Pulmonary:     Effort: Pulmonary effort is normal. No respiratory distress.     Breath sounds: No wheezing.     Comments: Decreased breath sound bilaterally Abdominal:     General: There is no distension.     Palpations: Abdomen is soft. There is no mass.     Tenderness: There is no abdominal tenderness.  Musculoskeletal:        General: No swelling or tenderness. Normal range of motion.     Cervical back: Normal range of motion and neck supple.     Comments: Trace edema bilaterally.   Lymphadenopathy:     Head:     Right side of head: No preauricular, posterior auricular or occipital adenopathy.     Left side of head: No preauricular, posterior auricular or occipital adenopathy.     Cervical: No cervical adenopathy.     Upper Body:     Right upper body: No supraclavicular or axillary adenopathy.     Left upper body: No supraclavicular or axillary adenopathy.     Lower Body: No right inguinal adenopathy. No left inguinal adenopathy.  Skin:    General: Skin is warm and dry.  Neurological:     Mental Status: He is alert and oriented to person, place, and time. Mental status is at  baseline.  Psychiatric:        Mood and Affect: Mood normal.    Labs were reviewed by me.     Latest Ref Rng & Units 08/01/2022    8:24 AM 07/18/2022    8:26 AM 07/04/2022    8:54 AM  CBC  WBC 4.0 - 10.5 K/uL 4.5  5.0  5.0   Hemoglobin 13.0 - 17.0 g/dL 11.9  11.9  11.6   Hematocrit 39.0 - 52.0 % 36.4  36.9  36.2   Platelets 150 - 400 K/uL 177  189  168       Latest Ref Rng & Units 08/01/2022    8:24 AM 07/18/2022    8:26 AM 07/04/2022    8:54 AM   CMP  Glucose 70 - 99 mg/dL 124  113  106   BUN 8 - 23 mg/dL 12  17  10    Creatinine 0.61 - 1.24 mg/dL 0.78  0.78  0.73   Sodium 135 - 145 mmol/L 135  137  137   Potassium 3.5 - 5.1 mmol/L 3.6  3.8  3.6   Chloride 98 - 111 mmol/L 102  105  104   CO2 22 - 32 mmol/L 24  24  24    Calcium 8.9 - 10.3 mg/dL 9.3  9.3  9.1   Total Protein 6.5 - 8.1 g/dL 6.6  6.6  6.5   Total Bilirubin 0.3 - 1.2 mg/dL 1.2  1.0  1.3   Alkaline Phos 38 - 126 U/L 43  41  41   AST 15 - 41 U/L 39  35  32   ALT 0 - 44 U/L 15  16  16

## 2022-08-01 NOTE — Assessment & Plan Note (Signed)
Mild anemia. continue to monitor.  Hemoglobin is close to baseline. 

## 2022-08-01 NOTE — Assessment & Plan Note (Signed)
Chemotherapy plan as listed above 

## 2022-08-01 NOTE — Patient Instructions (Signed)
[facility]  Discharge Instructions: Thank you for choosing [facility] to provide your oncology and hematology care.  If you have a lab appointment with the [facility], please go directly to the [facility] and check in at the registration area.  Wear comfortable clothing and clothing appropriate for easy access to any Portacath or PICC line.   We strive to give you quality time with your provider. You may need to reschedule your appointment if you arrive late (15 or more minutes).  Arriving late affects you and other patients whose appointments are after yours.  Also, if you miss three or more appointments without notifying the office, you may be dismissed from the clinic at the provider's discretion.      For prescription refill requests, have your pharmacy contact our office and allow 72 hours for refills to be completed.    Today you received the following chemotherapy and/or immunotherapy agents- bevacizumab, 5FU      To help prevent nausea and vomiting after your treatment, we encourage you to take your nausea medication as directed.  BELOW ARE SYMPTOMS THAT SHOULD BE REPORTED IMMEDIATELY: *FEVER GREATER THAN 100.4 F (38 C) OR HIGHER *CHILLS OR SWEATING *NAUSEA AND VOMITING THAT IS NOT CONTROLLED WITH YOUR NAUSEA MEDICATION *UNUSUAL SHORTNESS OF BREATH *UNUSUAL BRUISING OR BLEEDING *URINARY PROBLEMS (pain or burning when urinating, or frequent urination) *BOWEL PROBLEMS (unusual diarrhea, constipation, pain near the anus) TENDERNESS IN MOUTH AND THROAT WITH OR WITHOUT PRESENCE OF ULCERS (sore throat, sores in mouth, or a toothache) UNUSUAL RASH, SWELLING OR PAIN  UNUSUAL VAGINAL DISCHARGE OR ITCHING   Items with * indicate a potential emergency and should be followed up as soon as possible or go to the Emergency Department if any problems should occur.  Please show the CHEMOTHERAPY ALERT CARD or IMMUNOTHERAPY ALERT CARD at  check-in to the Emergency Department and triage nurse.  Should you have questions after your visit or need to cancel or reschedule your appointment, please contact [facility]  [phone] and follow the prompts.  Office hours are 8:00 a.m. to 4:30 p.m. Monday - Friday. Please note that voicemails left after 4:00 p.m. may not be returned until the following business day.  We are closed weekends and major holidays. You have access to a nurse at all times for urgent questions. Please call the main number to the clinic [phone] and follow the prompts.  For any non-urgent questions, you may also contact your provider using MyChart. We now offer e-Visits for anyone 15 and older to request care online for non-urgent symptoms. For details visit [email or web address].   Also download the MyChart app! Go to the app store, search "MyChart", open the app, select [facility], and log in with your MyChart username and password.

## 2022-08-01 NOTE — Assessment & Plan Note (Signed)
Follow up with urology

## 2022-08-01 NOTE — Assessment & Plan Note (Signed)
Overall he tolerates chemotherapy.   CEA is relatively stable with small fluctuations. CT images were reviewed and findings were discussed.  Small lung nodules, not able to further characterize due to size.  Will repeat CT in 3 months Labs reviewed and discussed with patient Proceed with 5-FU/bevacizumab treatment today Continue current regimen.  

## 2022-08-02 NOTE — Telephone Encounter (Signed)
Spoke with Mrs Garris, ok per DPR on file. Advised of results and scheduled lab visit in September 2024

## 2022-08-02 NOTE — Addendum Note (Signed)
Addended by: Kris Mouton on: 08/02/2022 03:53 PM   Modules accepted: Orders

## 2022-08-03 ENCOUNTER — Inpatient Hospital Stay: Payer: Medicare Other

## 2022-08-03 VITALS — BP 144/74 | HR 60 | Resp 18

## 2022-08-03 DIAGNOSIS — Z85038 Personal history of other malignant neoplasm of large intestine: Secondary | ICD-10-CM

## 2022-08-03 DIAGNOSIS — Z5112 Encounter for antineoplastic immunotherapy: Secondary | ICD-10-CM | POA: Diagnosis not present

## 2022-08-03 DIAGNOSIS — C786 Secondary malignant neoplasm of retroperitoneum and peritoneum: Secondary | ICD-10-CM

## 2022-08-03 LAB — CEA: CEA: 17.7 ng/mL — ABNORMAL HIGH (ref 0.0–4.7)

## 2022-08-03 MED ORDER — HEPARIN SOD (PORK) LOCK FLUSH 100 UNIT/ML IV SOLN
500.0000 [IU] | Freq: Once | INTRAVENOUS | Status: AC | PRN
  Administered 2022-08-03: 500 [IU]
  Filled 2022-08-03: qty 5

## 2022-08-03 MED ORDER — SODIUM CHLORIDE 0.9% FLUSH
10.0000 mL | INTRAVENOUS | Status: DC | PRN
  Administered 2022-08-03: 10 mL
  Filled 2022-08-03: qty 10

## 2022-08-15 ENCOUNTER — Encounter: Payer: Self-pay | Admitting: Oncology

## 2022-08-15 ENCOUNTER — Other Ambulatory Visit: Payer: Self-pay

## 2022-08-15 ENCOUNTER — Inpatient Hospital Stay (HOSPITAL_BASED_OUTPATIENT_CLINIC_OR_DEPARTMENT_OTHER): Payer: Medicare Other | Admitting: Oncology

## 2022-08-15 ENCOUNTER — Inpatient Hospital Stay: Payer: Medicare Other

## 2022-08-15 ENCOUNTER — Inpatient Hospital Stay: Payer: Medicare Other | Attending: Oncology

## 2022-08-15 VITALS — BP 131/77 | HR 72 | Temp 97.4°F | Resp 18 | Wt 232.8 lb

## 2022-08-15 DIAGNOSIS — Z5111 Encounter for antineoplastic chemotherapy: Secondary | ICD-10-CM

## 2022-08-15 DIAGNOSIS — C786 Secondary malignant neoplasm of retroperitoneum and peritoneum: Secondary | ICD-10-CM | POA: Diagnosis not present

## 2022-08-15 DIAGNOSIS — D6481 Anemia due to antineoplastic chemotherapy: Secondary | ICD-10-CM

## 2022-08-15 DIAGNOSIS — Z803 Family history of malignant neoplasm of breast: Secondary | ICD-10-CM | POA: Diagnosis not present

## 2022-08-15 DIAGNOSIS — T451X5D Adverse effect of antineoplastic and immunosuppressive drugs, subsequent encounter: Secondary | ICD-10-CM | POA: Diagnosis not present

## 2022-08-15 DIAGNOSIS — Z85038 Personal history of other malignant neoplasm of large intestine: Secondary | ICD-10-CM

## 2022-08-15 DIAGNOSIS — Z809 Family history of malignant neoplasm, unspecified: Secondary | ICD-10-CM | POA: Insufficient documentation

## 2022-08-15 DIAGNOSIS — Z5112 Encounter for antineoplastic immunotherapy: Secondary | ICD-10-CM | POA: Diagnosis present

## 2022-08-15 DIAGNOSIS — C184 Malignant neoplasm of transverse colon: Secondary | ICD-10-CM

## 2022-08-15 DIAGNOSIS — Z452 Encounter for adjustment and management of vascular access device: Secondary | ICD-10-CM | POA: Diagnosis not present

## 2022-08-15 DIAGNOSIS — R972 Elevated prostate specific antigen [PSA]: Secondary | ICD-10-CM

## 2022-08-15 DIAGNOSIS — T451X5A Adverse effect of antineoplastic and immunosuppressive drugs, initial encounter: Secondary | ICD-10-CM

## 2022-08-15 DIAGNOSIS — Z86010 Personal history of colonic polyps: Secondary | ICD-10-CM | POA: Diagnosis not present

## 2022-08-15 DIAGNOSIS — Z836 Family history of other diseases of the respiratory system: Secondary | ICD-10-CM | POA: Insufficient documentation

## 2022-08-15 LAB — COMPREHENSIVE METABOLIC PANEL
ALT: 14 U/L (ref 0–44)
AST: 37 U/L (ref 15–41)
Albumin: 3.4 g/dL — ABNORMAL LOW (ref 3.5–5.0)
Alkaline Phosphatase: 42 U/L (ref 38–126)
Anion gap: 7 (ref 5–15)
BUN: 10 mg/dL (ref 8–23)
CO2: 25 mmol/L (ref 22–32)
Calcium: 9.1 mg/dL (ref 8.9–10.3)
Chloride: 102 mmol/L (ref 98–111)
Creatinine, Ser: 0.8 mg/dL (ref 0.61–1.24)
GFR, Estimated: >60 mL/min (ref >60)
Glucose, Bld: 117 mg/dL — ABNORMAL HIGH (ref 70–99)
Potassium: 3.5 mmol/L (ref 3.5–5.1)
Sodium: 134 mmol/L — ABNORMAL LOW (ref 135–145)
Total Bilirubin: 1.4 mg/dL — ABNORMAL HIGH (ref 0.3–1.2)
Total Protein: 6.3 g/dL — ABNORMAL LOW (ref 6.5–8.1)

## 2022-08-15 LAB — CBC WITH DIFFERENTIAL/PLATELET
Abs Immature Granulocytes: 0.01 10*3/uL (ref 0.00–0.07)
Basophils Absolute: 0 10*3/uL (ref 0.0–0.1)
Basophils Relative: 1 %
Eosinophils Absolute: 0.1 10*3/uL (ref 0.0–0.5)
Eosinophils Relative: 2 %
HCT: 34.8 % — ABNORMAL LOW (ref 39.0–52.0)
Hemoglobin: 11.4 g/dL — ABNORMAL LOW (ref 13.0–17.0)
Immature Granulocytes: 0 %
Lymphocytes Relative: 39 %
Lymphs Abs: 1.6 10*3/uL (ref 0.7–4.0)
MCH: 30.4 pg (ref 26.0–34.0)
MCHC: 32.8 g/dL (ref 30.0–36.0)
MCV: 92.8 fL (ref 80.0–100.0)
Monocytes Absolute: 0.4 10*3/uL (ref 0.1–1.0)
Monocytes Relative: 9 %
Neutro Abs: 2.1 10*3/uL (ref 1.7–7.7)
Neutrophils Relative %: 49 %
Platelets: 149 10*3/uL — ABNORMAL LOW (ref 150–400)
RBC: 3.75 MIL/uL — ABNORMAL LOW (ref 4.22–5.81)
RDW: 14.9 % (ref 11.5–15.5)
WBC: 4.2 10*3/uL (ref 4.0–10.5)
nRBC: 0 % (ref 0.0–0.2)

## 2022-08-15 LAB — PROTEIN, URINE, RANDOM: Total Protein, Urine: 30 mg/dL

## 2022-08-15 MED ORDER — FLUOROURACIL CHEMO INJECTION 2.5 GM/50ML
400.0000 mg/m2 | Freq: Once | INTRAVENOUS | Status: AC
  Administered 2022-08-15: 1000 mg via INTRAVENOUS
  Filled 2022-08-15: qty 20

## 2022-08-15 MED ORDER — SODIUM CHLORIDE 0.9 % IV SOLN
Freq: Once | INTRAVENOUS | Status: AC
  Filled 2022-08-15: qty 250

## 2022-08-15 MED ORDER — SODIUM CHLORIDE 0.9 % IV SOLN
5.0000 mg/kg | Freq: Once | INTRAVENOUS | Status: AC
  Administered 2022-08-15: 500 mg via INTRAVENOUS
  Filled 2022-08-15: qty 16

## 2022-08-15 MED ORDER — SODIUM CHLORIDE 0.9 % IV SOLN
2400.0000 mg/m2 | INTRAVENOUS | Status: DC
  Administered 2022-08-15: 5000 mg via INTRAVENOUS
  Filled 2022-08-15: qty 100

## 2022-08-15 MED ORDER — SODIUM CHLORIDE 0.9 % IV SOLN
10.0000 mg | Freq: Once | INTRAVENOUS | Status: AC
  Administered 2022-08-15: 10 mg via INTRAVENOUS
  Filled 2022-08-15: qty 1

## 2022-08-15 MED ORDER — SODIUM CHLORIDE 0.9 % IV SOLN
400.0000 mg/m2 | Freq: Once | INTRAVENOUS | Status: AC
  Administered 2022-08-15: 916 mg via INTRAVENOUS
  Filled 2022-08-15: qty 45.8

## 2022-08-15 NOTE — Assessment & Plan Note (Signed)
Chemotherapy plan as listed above 

## 2022-08-15 NOTE — Assessment & Plan Note (Signed)
Follow up with urology

## 2022-08-15 NOTE — Assessment & Plan Note (Signed)
Mild anemia. continue to monitor.  Hemoglobin is close to baseline. 

## 2022-08-15 NOTE — Progress Notes (Signed)
Hematology/Oncology Progress note Telephone:([phone] Fax:(336) [phone]    Chief Complaint: Jared Gutierrez is a 82 y.o. male presents for follow-up of metastatic colon cancer   ASSESSMENT & PLAN:   Cancer Staging  Cancer of transverse colon Staging form: Colon and Rectum, [name], [name], cM1) - Signed by [name], MD on 10/26/2021   Cancer of transverse colon ([place]) Overall he tolerates chemotherapy.   CEA is relatively stable with small fluctuations. CT images were reviewed and findings were discussed.  Small lung nodules, not able to further characterize due to size.  Will repeat CT in 3 months Labs reviewed and discussed with patient Proceed with 5-FU/bevacizumab treatment today Continue current regimen.   Encounter for antineoplastic chemotherapy Chemotherapy plan as listed above.   Anemia due to chemotherapy Mild anemia. continue to monitor.  Hemoglobin is close to baseline.  Elevated PSA Follow up with urology    Follow-up 2 weeks lab MD 5-FU/bevacizumab -   All questions were answered. The patient knows to call the clinic with any problems, questions or concerns.  [name], MD, PhD [facility] Hematology Oncology 08/15/2022     PERTINENT ONCOLOGY HISTORY Jared Gutierrez is a 82 y.o.[place] who has above oncology history reviewed by me today presented for follow up visit for management of Metastatic colon cancer. [name], patient switched care to me on 11/11/20 Extensive medical record review was performed by me  Oncology History  History of colon cancer, stage I  10/02/2014 Pathology Results   [id number] colonoscopy pathology A. CECAL POLYPS; HOT SNARE AND COLD BIOPSY:  - TUBULAR ADENOMA, MULTIPLE FRAGMENTS.  - NEGATIVE FOR HIGH-GRADE DYSPLASIA AND MALIGNANCY.   B. COLON POLYP, TRANSVERSE; COLD BIOPSY:  - TUBULAR ADENOMA.  - NEGATIVE FOR HIGH-GRADE DYSPLASIA AND  MALIGNANCY.     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  Tumor extended into the submucosa. Margins were negative. + lymphovascular invasion. 14 lymph nodes were negative. Pathologic stage was T1 N0.  TRANSVERSE COLON, RESECTION:  - INVASIVE ADENOCARCINOMA ARISING IN A 4.6 CM TUBULOVILLOUS  ADENOMA WITH HIGH GRADE DYSPLASIA (MALIGNANT POLYP).  - SEE SUMMARY BELOW.  [name]  ONCOLOGY SUMMARY: COLON AND RECTUM, RESECTION, AJCC 7TH EDITION  Specimen: transverse colon  Procedure: transverse colon resection  Tumor site: splenic flexure  Tumor size: 1.0 cm (invasive component)  Macroscopic tumor perforation: not specified  Histologic type: invasive adenocarcinoma  Histologic grade: moderately differentiated  Microscopic tumor extension: into submucosa  Margins:       Proximal margin: negative       Distal margin: negative       Circumferential (radial) or mesenteric margin: negative  If all margins uninvolved by invasive carcinoma:       Distance of invasive carcinoma from closest margin: 7.5 cm  to distal margin  Treatment effect: not applicable  Lymph-vascular invasion: present  Perineural invasion: not identified  Tumor deposits  (discontinuous extramural extension): not  identified  Pathologic staging:       Primary tumor: pT1       Regional lymph nodes: pN0            Number of nodes examined: 14            Number of nodes involved: 0    04/02/2019 Progression   04/02/2019  PET scan  limited evealed a 2.4 x 2.3 cm (SUV 11) hypermetabolic soft tissue density caudal and anterior to the pancreatic neck favored to represent isolated peritoneal or nodal metastasis in the setting of prior transverse colonic resection (expected primary drainage). Although this was immediately adjacent to the pancreas, a fat plane was maintained, arguing strongly against a pancreatic primary. Otherwise, there was no evidence of hypermetabolic metastasis. 04/17/2019 EUS on  revealed a normal esophagus, stomach, duodenum, and pancreas.  There was a 2.4 x 2.4 cm irregular mass in the retroperitoneum adjacent to, but not involving the pancreatic neck.  FNA and core needle biopsy were performed.  Pathology revealed adenocarcinoma compatible with a metastatic lesion of colorectal origin.  Tumor cells were positive for CK20 and CDX2 and negative for CK7.  NGS: [employer] on 05/15/2019 revealed + [id number] and TP53.  Negative results included BRAF V600E, Her2, NRAS, NTRK, PD-L1 (<1%), and TMB 8.7/Mb (intermediate).  MMR testing from his colon resection on 10/16/2013 was intact with a low probability of MSI-H.   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  He received Neulasta after cycle #4 and #5 secondary to progressive leukopenia.  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  He received a truncated course of FOLFOX with cycle #11 secondary to oxaliplatin reaction.   01/14/2020 Imaging    PET revealed an interval decrease in size and FDG uptake (2.4 x 2.3 cm with SUV 11 to 2.4 x 1.7 cm with SUV 4.09)  associated with the previously referenced soft tissue density caudal and anterior to the pancreatic neck suggesting treatment response. There were no new sites of FDG avid tumor.   08/31/2020 Imaging   08/31/2020 Abdomen and pelvis CT revealed increased size of the index soft tissue lesion inferior and anterior to the pancreatic neck (2.9 x 1.8 cm to 3.5 x 2.9 cm). There were similar prominent retroperitoneal lymph nodes without adenopathy by size criteria. There was no new or enlarging abdominal or pelvic lymph nodes. There were no new interval findings. There was similar circumferential wall thickening of a nondistended urinary bladder, which likely accentuated wall thickening There was hepatic steatosis and aortic atherosclerosis.   11/03/2020, PET showed recurrent peritoneal metastasis in the upper abdomen adjacent to the pancreas.  The lesion is 3.8 x 2.9 cm with SUV of 11.3. No evidence of metastatic peritoneal disease elsewhere in the abdomen pelvis.  No evidence of distant metastasis.   11/03/2020 Imaging   PET scan showed recurrent peritoneal metastasis near pancreas. Given that he has no other distant metastasis. Discussed with radiation oncology. Repeat SBRT may be considered if he does not respond well to systemic chemotherapy.     11/26/2020 -  Chemotherapy   5-FU and bevacizumab.   02/17/2021 Imaging   02/17/2021 CT abdomen pelvis showed partial response. Mild decrease of peritoneal lesion.  Proceed with 5-FU and bevacizumab today.  Given that he is tolerating current regimen with good life quality, partial response.  Shared decision was made to hold off adding additional chemotherapy agents.   06/08/2021, CT chest abdomen pelvis with contrast showed soft tissue mass at the base of mesentery inferior to the pancreatic neck is unchanged in size.  No progressive disease was identified.   09/07/2021, CT chest abdomen pelvis with contrast showed no substantial changes in size of the  mesenteric mass, 2.6 cm.  No suspicious new lesions.  Chronic findings as detailed in the imaging report.   12/05/2021 Imaging   PET scan showed soft tissue mesenteric mass is unchanged in size.  Decreased FDG uptake compared to activity on November 03, 2020.  Fever treated disease.  No new metastatic disease.   03/21/2022 Imaging   CT chest abdomen pelvis  1. Similar size of the peripancreatic presumed peritoneal implant compared to 09/07/2021. 2. No new or progressive disease. 3. Incidental findings, including: Coronary artery atherosclerosis.Aortic Atherosclerosis (ICD10-I70.0). Prostatomegaly with chronic bladder wall thickening, suggesting outlet obstruction.     07/11/2022 Imaging   CT chest abdomen pelvis with contrast showed 1. New 6 mm nodule in the right upper lobe and 2 mm nodule in the left upper lobe. Although small these are concerning. Recommend short follow-up in 3 months 2. Stable low-attenuation lesion along the midbody of the pancreas. Continued attention on follow-up. No new peritoneal mass lesions or ascites. 3. Fatty liver infiltration 4. Colonic surgical changes      Other medical problems Chronic lower extremity edema. 12/24/2018, right lower extremity duplex negative for DVT.  Small right Baker's cyst. 08/16/2019, bilateral lower extremity duplex showed no DVT. 08/16/2019 - 08/17/2019 with right lower extremity cellulitis.  He was unable to bear weight.  He was treated with IV fluids, NSAIDs, colchicine, and broad antibiotics (vancomycin and Cefepime).  He was discharged on indomethacin x 5 days and Keflex 500 mg TID x 5 days.  10/05/2020, colonoscopy showed internal hemorrhoids.  Otherwise normal examination.  INTERVAL HISTORY Jared Gutierrez is a 82 y.o. male who has above history reviewed by me today presents for follow up visit for management of recurrent metastatic colon cancer Patient was accompanied by wife.   Denies fever, chills, nausea, vomiting, diarrhea,  chest pain, shortness of breath, abdominal pain,  He has no new complaints today    Review of Systems  Constitutional:  Negative for appetite change, chills, diaphoresis, fever and unexpected weight change.  HENT:   Negative for hearing loss, nosebleeds, sore throat and tinnitus.   Respiratory:  Negative for cough, hemoptysis and shortness of breath.   Cardiovascular:  Negative for chest pain and palpitations.  Gastrointestinal:  Negative for abdominal pain, blood in stool, constipation, diarrhea, nausea and vomiting.  Genitourinary:  Negative for dysuria, frequency and hematuria.   Musculoskeletal:  Positive for arthralgias. Negative for back pain, myalgias and neck pain.  Skin:  Negative for itching and rash.  Neurological:  Positive for numbness. Negative for dizziness and headaches.  Hematological:  Does not bruise/bleed easily.  Psychiatric/Behavioral:  Negative for depression. The patient is not nervous/anxious.       Past Medical History:  Diagnosis Date   Arthritis    OSTEOARTHRITIS   Cancer    Cavitary lesion of lung    RIGHT LOWER LOBE  Chicken pox    Colon cancer    History of kidney stones    Hypertension    Lipoma of colon    Nephrolithiasis    Nephrolithiasis    Obesity    Shingles    Tubular adenoma of colon    multiple fragments    Past Surgical History:  Procedure Laterality Date   COLON SURGERY     COLONOSCOPY N/A 10/02/2014   Procedure: COLONOSCOPY;  Surgeon: Josefine Class, MD;  Location: [facility] ENDOSCOPY;  Service: Endoscopy;  Laterality: N/A;   COLONOSCOPY WITH PROPOFOL N/A 02/16/2017   Procedure: COLONOSCOPY WITH PROPOFOL;  Surgeon: Manya Silvas, MD;  Location: [facility] ENDOSCOPY;  Service: Endoscopy;  Laterality: N/A;   COLONOSCOPY WITH PROPOFOL N/A 10/05/2020   Procedure: COLONOSCOPY WITH PROPOFOL;  Surgeon: Lesly Rubenstein, MD;  Location: [facility] ENDOSCOPY;  Service: Endoscopy;  Laterality: N/A;   EUS N/A 04/17/2019   Procedure: FULL  UPPER ENDOSCOPIC ULTRASOUND (EUS) RADIAL;  Surgeon: Holly Bodily, MD;  Location: [facility] ENDOSCOPY;  Service: Gastroenterology;  Laterality: N/A;   KIDNEY STONE SURGERY     PARTIAL COLECTOMY  10/17/2013   PORTACATH PLACEMENT Right 06/13/2019   Procedure: INSERTION PORT-A-CATH;  Surgeon: Benjamine Sprague, DO;  Location: [facility] ORS;  Service: General;  Laterality: Right;    Family History  Problem Relation Age of Onset   Cancer Mother    Breast cancer Mother    COPD Father     Social History:  reports that he has never smoked. He has never used smokeless tobacco. He reports current alcohol use. He reports that he does not use drugs.    Allergies: No Known Allergies  Current Medications: Current Outpatient Medications  Medication Sig Dispense Refill   acetaminophen (TYLENOL) 500 MG tablet Take 500 mg by mouth every 6 (six) hours as needed.     amLODipine (NORVASC) 2.5 MG tablet Take 2.5 mg by mouth daily.     aspirin EC 81 MG tablet Take 81 mg by mouth daily.     Calcium Carbonate (CALCIUM 600 PO) Take 1 tablet by mouth 2 (two) times daily.     Cholecalciferol (VITAMIN D) 125 MCG (5000 UT) CAPS Take 5,000 mg by mouth.     docusate sodium (COLACE) 100 MG capsule Take 1 capsule (100 mg total) by mouth 2 (two) times daily as needed for mild constipation or moderate constipation. 60 capsule 3   HYDROcodone-acetaminophen (NORCO/VICODIN) 5-325 MG tablet Take 1 tablet by mouth every 6 (six) hours as needed for moderate pain (up to 3 doses for moderate pain.).     lidocaine-prilocaine (EMLA) cream APPLY TO AFFECTED AREA ONCE AS DIRECTED 30 g 3   loperamide (IMODIUM) 2 MG capsule Take 1 capsule (2 mg total) by mouth See admin instructions. Take 2 tablets after first loose stool,  then 1 tablet  after each loose stool; maximum: 8 tablets /day 60 capsule 0   loratadine (CLARITIN) 10 MG tablet Take 10 mg by mouth daily.     olmesartan (BENICAR) 20 MG tablet Take 1 tablet by mouth daily.      ondansetron (ZOFRAN) 8 MG tablet Take 1 tablet (8 mg total) by mouth 2 (two) times daily as needed for refractory nausea / vomiting. Start on day 3 after chemotherapy. 60 tablet 1   potassium chloride SA (KLOR-CON M20) 20 MEQ tablet Take 1 tablet (20 mEq total) by mouth daily. 180 tablet 1   Probiotic Product (PROBIOTIC DAILY PO) Take 1 tablet by mouth daily.  prochlorperazine (COMPAZINE) 10 MG tablet Take 1 tablet (10 mg total) by mouth every 6 (six) hours as needed (NAUSEA). 30 tablet 1   senna-docusate (SENNA S) 8.6-50 MG tablet Take 2 tablets by mouth daily. 60 tablet 3   No current facility-administered medications for this visit.   Facility-Administered Medications Ordered in Other Visits  Medication Dose Route Frequency Provider Last Rate Last Admin   0.9 %  sodium chloride infusion   Intravenous Once Corcoran, Melissa C, MD       0.9 %  sodium chloride infusion   Intravenous Continuous Nolon Stalls C, MD 10 mL/hr at 12/31/19 1000 New Bag at 10/05/20 1045   heparin lock flush 100 unit/mL  500 Units Intravenous Once Lequita Asal, MD        Performance status (ECOG): 1  Vitals Blood pressure 131/77, pulse 72, temperature (!) 97.4 F (36.3 C), temperature source Tympanic, resp. rate 18, weight 232 lb 12.8 oz (105.6 kg), SpO2 100 %.   Physical Exam Vitals and nursing note reviewed.  Constitutional:      General: He is not in acute distress.    Appearance: He is well-developed. He is obese. He is not diaphoretic.     Interventions: Face mask in place.  HENT:     Head: Normocephalic and atraumatic.  Eyes:     General: No scleral icterus.    Extraocular Movements: Extraocular movements intact.     Conjunctiva/sclera: Conjunctivae normal.     Pupils: Pupils are equal, round, and reactive to light.  Cardiovascular:     Rate and Rhythm: Normal rate and regular rhythm.     Heart sounds: Normal heart sounds. No murmur heard. Pulmonary:     Effort: Pulmonary effort is  normal. No respiratory distress.     Breath sounds: No wheezing.     Comments: Decreased breath sound bilaterally Abdominal:     General: There is no distension.     Palpations: Abdomen is soft. There is no mass.     Tenderness: There is no abdominal tenderness.  Musculoskeletal:        General: No swelling or tenderness. Normal range of motion.     Cervical back: Normal range of motion and neck supple.     Comments: Trace edema bilaterally.   Lymphadenopathy:     Head:     Right side of head: No preauricular, posterior auricular or occipital adenopathy.     Left side of head: No preauricular, posterior auricular or occipital adenopathy.     Cervical: No cervical adenopathy.     Upper Body:     Right upper body: No supraclavicular or axillary adenopathy.     Left upper body: No supraclavicular or axillary adenopathy.     Lower Body: No right inguinal adenopathy. No left inguinal adenopathy.  Skin:    General: Skin is warm and dry.  Neurological:     Mental Status: He is alert and oriented to person, place, and time. Mental status is at baseline.  Psychiatric:        Mood and Affect: Mood normal.    Labs were reviewed by me.     Latest Ref Rng & Units 08/15/2022    8:38 AM 08/01/2022    8:24 AM 07/18/2022    8:26 AM  CBC  WBC 4.0 - 10.5 K/uL 4.2  4.5  5.0   Hemoglobin 13.0 - 17.0 g/dL 11.4  11.9  11.9   Hematocrit 39.0 - 52.0 % 34.8  36.4  36.9   Platelets 150 - 400 K/uL 149  177  189       Latest Ref Rng & Units 08/15/2022    8:38 AM 08/01/2022    8:24 AM 07/18/2022    8:26 AM  CMP  Glucose 70 - 99 mg/dL 117  124  113   BUN 8 - 23 mg/dL 10  12  17    Creatinine 0.61 - 1.24 mg/dL 0.80  0.78  0.78   Sodium 135 - 145 mmol/L 134  135  137   Potassium 3.5 - 5.1 mmol/L 3.5  3.6  3.8   Chloride 98 - 111 mmol/L 102  102  105   CO2 22 - 32 mmol/L 25  24  24    Calcium 8.9 - 10.3 mg/dL 9.1  9.3  9.3   Total Protein 6.5 - 8.1 g/dL 6.3  6.6  6.6   Total Bilirubin 0.3 - 1.2 mg/dL 1.4   1.2  1.0   Alkaline Phos 38 - 126 U/L 42  43  41   AST 15 - 41 U/L 37  39  35   ALT 0 - 44 U/L 14  15  16

## 2022-08-15 NOTE — Patient Instructions (Signed)
[facility]  Discharge Instructions: Thank you for choosing [facility] to provide your oncology and hematology care.  If you have a lab appointment with the [facility], please go directly to the [facility] and check in at the registration area.  Wear comfortable clothing and clothing appropriate for easy access to any Portacath or PICC line.   We strive to give you quality time with your provider. You may need to reschedule your appointment if you arrive late (15 or more minutes).  Arriving late affects you and other patients whose appointments are after yours.  Also, if you miss three or more appointments without notifying the office, you may be dismissed from the clinic at the provider's discretion.      For prescription refill requests, have your pharmacy contact our office and allow 72 hours for refills to be completed.    Today you received the following chemotherapy and/or immunotherapy agents BEVACIZAMAB,    LEUCOVORIN, 5 FU   To help prevent nausea and vomiting after your treatment, we encourage you to take your nausea medication as directed.  BELOW ARE SYMPTOMS THAT SHOULD BE REPORTED IMMEDIATELY: *FEVER GREATER THAN 100.4 F (38 C) OR HIGHER *CHILLS OR SWEATING *NAUSEA AND VOMITING THAT IS NOT CONTROLLED WITH YOUR NAUSEA MEDICATION *UNUSUAL SHORTNESS OF BREATH *UNUSUAL BRUISING OR BLEEDING *URINARY PROBLEMS (pain or burning when urinating, or frequent urination) *BOWEL PROBLEMS (unusual diarrhea, constipation, pain near the anus) TENDERNESS IN MOUTH AND THROAT WITH OR WITHOUT PRESENCE OF ULCERS (sore throat, sores in mouth, or a toothache) UNUSUAL RASH, SWELLING OR PAIN  UNUSUAL VAGINAL DISCHARGE OR ITCHING   Items with * indicate a potential emergency and should be followed up as soon as possible or go to the Emergency Department if any problems should occur.  Please show the CHEMOTHERAPY ALERT CARD or IMMUNOTHERAPY ALERT  CARD at check-in to the Emergency Department and triage nurse.  Should you have questions after your visit or need to cancel or reschedule your appointment, please contact [facility]  [phone] and follow the prompts.  Office hours are 8:00 a.m. to 4:30 p.m. Monday - Friday. Please note that voicemails left after 4:00 p.m. may not be returned until the following business day.  We are closed weekends and major holidays. You have access to a nurse at all times for urgent questions. Please call the main number to the clinic [phone] and follow the prompts.  For any non-urgent questions, you may also contact your provider using MyChart. We now offer e-Visits for anyone 21 and older to request care online for non-urgent symptoms. For details visit [email or web address].   Also download the MyChart app! Go to the app store, search "MyChart", open the app, select [facility], and log in with your MyChart username and password.  Bevacizumab Injection What is this medication? BEVACIZUMAB (be va SIZ yoo mab) treats some types of cancer. It works by blocking a protein that causes cancer cells to grow and multiply. This helps to slow or stop the spread of cancer cells. It is a monoclonal antibody. This medicine may be used for other purposes; ask your health care provider or pharmacist if you have questions. COMMON BRAND NAME(S): Alymsys, Avastin, MVASI, Noah Charon What should I tell my care team before I take this medication? They need to know if you have any of these conditions: Blood clots Coughing up blood Having or recent surgery Heart failure High blood pressure History  of a connection between 2 or more body parts that do not usually connect (fistula) History of a tear in your stomach or intestines Protein in your urine An unusual or allergic reaction to bevacizumab, other medications, foods, dyes, or preservatives Pregnant or trying to get  pregnant Breast-feeding How should I use this medication? This medication is injected into a vein. It is given by your care team in a hospital or clinic setting. Talk to your care team the use of this medication in children. Special care may be needed. Overdosage: If you think you have taken too much of this medicine contact a poison control center or emergency room at once. NOTE: This medicine is only for you. Do not share this medicine with others. What if I miss a dose? Keep appointments for follow-up doses. It is important not to miss your dose. Call your care team if you are unable to keep an appointment. What may interact with this medication? Interactions are not expected. This list may not describe all possible interactions. Give your health care provider a list of all the medicines, herbs, non-prescription drugs, or dietary supplements you use. Also tell them if you smoke, drink alcohol, or use illegal drugs. Some items may interact with your medicine. What should I watch for while using this medication? Your condition will be monitored carefully while you are receiving this medication. You may need blood work while taking this medication. This medication may make you feel generally unwell. This is not uncommon as chemotherapy can affect healthy cells as well as cancer cells. Report any side effects. Continue your course of treatment even though you feel ill unless your care team tells you to stop. This medication may increase your risk to bruise or bleed. Call your care team if you notice any unusual bleeding. Before having surgery, talk to your care team to make sure it is ok. This medication can increase the risk of poor healing of your surgical site or wound. You will need to stop this medication for 28 days before surgery. After surgery, wait at least 28 days before restarting this medication. Make sure the surgical site or wound is healed enough before restarting this medication. Talk  to your care team if questions. Talk to your care team if you may be pregnant. Serious birth defects can occur if you take this medication during pregnancy and for 6 months after the last dose. Contraception is recommended while taking this medication and for 6 months after the last dose. Your care team can help you find the option that works for you. Do not breastfeed while taking this medication and for 6 months after the last dose. This medication can cause infertility. Talk to your care team if you are concerned about your fertility. What side effects may I notice from receiving this medication? Side effects that you should report to your care team as soon as possible: Allergic reactions--skin rash, itching, hives, swelling of the face, lips, tongue, or throat Bleeding--bloody or black, tar-like stools, vomiting blood or brown material that looks like coffee grounds, red or dark brown urine, small red or purple spots on skin, unusual bruising or bleeding Blood clot--pain, swelling, or warmth in the leg, shortness of breath, chest pain Heart attack--pain or tightness in the chest, shoulders, arms, or jaw, nausea, shortness of breath, cold or clammy skin, feeling faint or lightheaded Heart failure--shortness of breath, swelling of the ankles, feet, or hands, sudden weight gain, unusual weakness or fatigue Increase in blood pressure  Infection--fever, chills, cough, sore throat, wounds that don't heal, pain or trouble when passing urine, general feeling of discomfort or being unwell Infusion reactions--chest pain, shortness of breath or trouble breathing, feeling faint or lightheaded Kidney injury--decrease in the amount of urine, swelling of the ankles, hands, or feet Stomach pain that is severe, does not go away, or gets worse Stroke--sudden numbness or weakness of the face, arm, or leg, trouble speaking, confusion, trouble walking, loss of balance or coordination, dizziness, severe headache,  change in vision Sudden and severe headache, confusion, change in vision, seizures, which may be signs of posterior reversible encephalopathy syndrome (PRES) Side effects that usually do not require medical attention (report to your care team if they continue or are bothersome): Back pain Change in taste Diarrhea Dry skin Increased tears Nosebleed This list may not describe all possible side effects. Call your doctor for medical advice about side effects. You may report side effects to FDA at 1-800-FDA-1088. Where should I keep my medication? This medication is given in a hospital or clinic. It will not be stored at home. NOTE: This sheet is a summary. It may not cover all possible information. If you have questions about this medicine, talk to your doctor, pharmacist, or health care provider.  2023 Elsevier/Gold Standard (2021-09-02 00:00:00)  Leucovorin Injection What is this medication? LEUCOVORIN (loo koe VOR in) prevents side effects from certain medications, such as methotrexate. It works by increasing folate levels. This helps protect healthy cells in your body. It may also be used to treat anemia caused by low levels of folate. It can also be used with fluorouracil, a type of chemotherapy, to treat colorectal cancer. It works by increasing the effects of fluorouracil in the body. This medicine may be used for other purposes; ask your health care provider or pharmacist if you have questions. What should I tell my care team before I take this medication? They need to know if you have any of these conditions: Anemia from low levels of vitamin B12 in the blood An unusual or allergic reaction to leucovorin, folic acid, other medications, foods, dyes, or preservatives Pregnant or trying to get pregnant Breastfeeding How should I use this medication? This medication is injected into a vein or a muscle. It is given by your care team in a hospital or clinic setting. Talk to your care team  about the use of this medication in children. Special care may be needed. Overdosage: If you think you have taken too much of this medicine contact a poison control center or emergency room at once. NOTE: This medicine is only for you. Do not share this medicine with others. What if I miss a dose? Keep appointments for follow-up doses. It is important not to miss your dose. Call your care team if you are unable to keep an appointment. What may interact with this medication? Capecitabine Fluorouracil Phenobarbital Phenytoin Primidone Trimethoprim;sulfamethoxazole This list may not describe all possible interactions. Give your health care provider a list of all the medicines, herbs, non-prescription drugs, or dietary supplements you use. Also tell them if you smoke, drink alcohol, or use illegal drugs. Some items may interact with your medicine. What should I watch for while using this medication? Your condition will be monitored carefully while you are receiving this medication. This medication may increase the side effects of 5-fluorouracil. Tell your care team if you have diarrhea or mouth sores that do not get better or that get worse. What side effects may I notice  from receiving this medication? Side effects that you should report to your care team as soon as possible: Allergic reactions--skin rash, itching, hives, swelling of the face, lips, tongue, or throat This list may not describe all possible side effects. Call your doctor for medical advice about side effects. You may report side effects to FDA at 1-800-FDA-1088. Where should I keep my medication? This medication is given in a hospital or clinic. It will not be stored at home. NOTE: This sheet is a summary. It may not cover all possible information. If you have questions about this medicine, talk to your doctor, pharmacist, or health care provider.  2023 Elsevier/Gold Standard (2021-09-09 00:00:00)  Fluorouracil Injection What  is this medication? FLUOROURACIL (flure oh YOOR a sil) treats some types of cancer. It works by slowing down the growth of cancer cells. This medicine may be used for other purposes; ask your health care provider or pharmacist if you have questions. COMMON BRAND NAME(S): Adrucil What should I tell my care team before I take this medication? They need to know if you have any of these conditions: Blood disorders Dihydropyrimidine dehydrogenase (DPD) deficiency Infection, such as chickenpox, cold sores, herpes Kidney disease Liver disease Poor nutrition Recent or ongoing radiation therapy An unusual or allergic reaction to fluorouracil, other medications, foods, dyes, or preservatives If you or your partner are pregnant or trying to get pregnant Breast-feeding How should I use this medication? This medication is injected into a vein. It is administered by your care team in a hospital or clinic setting. Talk to your care team about the use of this medication in children. Special care may be needed. Overdosage: If you think you have taken too much of this medicine contact a poison control center or emergency room at once. NOTE: This medicine is only for you. Do not share this medicine with others. What if I miss a dose? Keep appointments for follow-up doses. It is important not to miss your dose. Call your care team if you are unable to keep an appointment. What may interact with this medication? Do not take this medication with any of the following: Live virus vaccines This medication may also interact with the following: Medications that treat or prevent blood clots, such as warfarin, enoxaparin, dalteparin This list may not describe all possible interactions. Give your health care provider a list of all the medicines, herbs, non-prescription drugs, or dietary supplements you use. Also tell them if you smoke, drink alcohol, or use illegal drugs. Some items may interact with your  medicine. What should I watch for while using this medication? Your condition will be monitored carefully while you are receiving this medication. This medication may make you feel generally unwell. This is not uncommon as chemotherapy can affect healthy cells as well as cancer cells. Report any side effects. Continue your course of treatment even though you feel ill unless your care team tells you to stop. In some cases, you may be given additional medications to help with side effects. Follow all directions for their use. This medication may increase your risk of getting an infection. Call your care team for advice if you get a fever, chills, sore throat, or other symptoms of a cold or flu. Do not treat yourself. Try to avoid being around people who are sick. This medication may increase your risk to bruise or bleed. Call your care team if you notice any unusual bleeding. Be careful brushing or flossing your teeth or using a toothpick because  you may get an infection or bleed more easily. If you have any dental work done, tell your dentist you are receiving this medication. Avoid taking medications that contain aspirin, acetaminophen, ibuprofen, naproxen, or ketoprofen unless instructed by your care team. These medications may hide a fever. Do not treat diarrhea with over the counter products. Contact your care team if you have diarrhea that lasts more than 2 days or if it is severe and watery. This medication can make you more sensitive to the sun. Keep out of the sun. If you cannot avoid being in the sun, wear protective clothing and sunscreen. Do not use sun lamps, tanning beds, or tanning booths. Talk to your care team if you or your partner wish to become pregnant or think you might be pregnant. This medication can cause serious birth defects if taken during pregnancy and for 3 months after the last dose. A reliable form of contraception is recommended while taking this medication and for 3 months  after the last dose. Talk to your care team about effective forms of contraception. Do not father a child while taking this medication and for 3 months after the last dose. Use a condom while having sex during this time period. Do not breastfeed while taking this medication. This medication may cause infertility. Talk to your care team if you are concerned about your fertility. What side effects may I notice from receiving this medication? Side effects that you should report to your care team as soon as possible: Allergic reactions--skin rash, itching, hives, swelling of the face, lips, tongue, or throat Heart attack--pain or tightness in the chest, shoulders, arms, or jaw, nausea, shortness of breath, cold or clammy skin, feeling faint or lightheaded Heart failure--shortness of breath, swelling of the ankles, feet, or hands, sudden weight gain, unusual weakness or fatigue Heart rhythm changes--fast or irregular heartbeat, dizziness, feeling faint or lightheaded, chest pain, trouble breathing High ammonia level--unusual weakness or fatigue, confusion, loss of appetite, nausea, vomiting, seizures Infection--fever, chills, cough, sore throat, wounds that don't heal, pain or trouble when passing urine, general feeling of discomfort or being unwell Low red blood cell level--unusual weakness or fatigue, dizziness, headache, trouble breathing Pain, tingling, or numbness in the hands or feet, muscle weakness, change in vision, confusion or trouble speaking, loss of balance or coordination, trouble walking, seizures Redness, swelling, and blistering of the skin over hands and feet Severe or prolonged diarrhea Unusual bruising or bleeding Side effects that usually do not require medical attention (report to your care team if they continue or are bothersome): Dry skin Headache Increased tears Nausea Pain, redness, or swelling with sores inside the mouth or throat Sensitivity to light Vomiting This list  may not describe all possible side effects. Call your doctor for medical advice about side effects. You may report side effects to FDA at 1-800-FDA-1088. Where should I keep my medication? This medication is given in a hospital or clinic. It will not be stored at home. NOTE: This sheet is a summary. It may not cover all possible information. If you have questions about this medicine, talk to your doctor, pharmacist, or health care provider.  2023 Elsevier/Gold Standard (2021-08-30 00:00:00)

## 2022-08-15 NOTE — Assessment & Plan Note (Signed)
Overall he tolerates chemotherapy.   CEA is relatively stable with small fluctuations. CT images were reviewed and findings were discussed.  Small lung nodules, not able to further characterize due to size.  Will repeat CT in 3 months Labs reviewed and discussed with patient Proceed with 5-FU/bevacizumab treatment today Continue current regimen.  

## 2022-08-17 ENCOUNTER — Inpatient Hospital Stay: Payer: Medicare Other

## 2022-08-17 VITALS — BP 140/86 | HR 76 | Resp 18

## 2022-08-17 DIAGNOSIS — C786 Secondary malignant neoplasm of retroperitoneum and peritoneum: Secondary | ICD-10-CM

## 2022-08-17 DIAGNOSIS — Z85038 Personal history of other malignant neoplasm of large intestine: Secondary | ICD-10-CM

## 2022-08-17 DIAGNOSIS — Z5112 Encounter for antineoplastic immunotherapy: Secondary | ICD-10-CM | POA: Diagnosis not present

## 2022-08-17 MED ORDER — SODIUM CHLORIDE 0.9% FLUSH
10.0000 mL | INTRAVENOUS | Status: DC | PRN
  Administered 2022-08-17: 10 mL
  Filled 2022-08-17: qty 10

## 2022-08-17 MED ORDER — HEPARIN SOD (PORK) LOCK FLUSH 100 UNIT/ML IV SOLN
500.0000 [IU] | Freq: Once | INTRAVENOUS | Status: AC | PRN
  Administered 2022-08-17: 500 [IU]
  Filled 2022-08-17: qty 5

## 2022-08-29 ENCOUNTER — Encounter: Payer: Self-pay | Admitting: Oncology

## 2022-08-29 ENCOUNTER — Inpatient Hospital Stay: Payer: Medicare Other

## 2022-08-29 ENCOUNTER — Inpatient Hospital Stay (HOSPITAL_BASED_OUTPATIENT_CLINIC_OR_DEPARTMENT_OTHER): Payer: Medicare Other | Admitting: Oncology

## 2022-08-29 VITALS — BP 133/58

## 2022-08-29 VITALS — BP 123/90 | HR 90 | Temp 98.4°F | Resp 18 | Wt 226.7 lb

## 2022-08-29 DIAGNOSIS — Z85038 Personal history of other malignant neoplasm of large intestine: Secondary | ICD-10-CM

## 2022-08-29 DIAGNOSIS — C184 Malignant neoplasm of transverse colon: Secondary | ICD-10-CM | POA: Diagnosis not present

## 2022-08-29 DIAGNOSIS — D6481 Anemia due to antineoplastic chemotherapy: Secondary | ICD-10-CM | POA: Diagnosis not present

## 2022-08-29 DIAGNOSIS — T451X5A Adverse effect of antineoplastic and immunosuppressive drugs, initial encounter: Secondary | ICD-10-CM

## 2022-08-29 DIAGNOSIS — Z5111 Encounter for antineoplastic chemotherapy: Secondary | ICD-10-CM

## 2022-08-29 DIAGNOSIS — C786 Secondary malignant neoplasm of retroperitoneum and peritoneum: Secondary | ICD-10-CM

## 2022-08-29 DIAGNOSIS — Z5112 Encounter for antineoplastic immunotherapy: Secondary | ICD-10-CM | POA: Diagnosis not present

## 2022-08-29 LAB — CBC WITH DIFFERENTIAL/PLATELET
Abs Immature Granulocytes: 0.01 10*3/uL (ref 0.00–0.07)
Basophils Absolute: 0 10*3/uL (ref 0.0–0.1)
Basophils Relative: 1 %
Eosinophils Absolute: 0.1 10*3/uL (ref 0.0–0.5)
Eosinophils Relative: 2 %
HCT: 34.9 % — ABNORMAL LOW (ref 39.0–52.0)
Hemoglobin: 11.3 g/dL — ABNORMAL LOW (ref 13.0–17.0)
Immature Granulocytes: 0 %
Lymphocytes Relative: 35 %
Lymphs Abs: 1.6 10*3/uL (ref 0.7–4.0)
MCH: 30.3 pg (ref 26.0–34.0)
MCHC: 32.4 g/dL (ref 30.0–36.0)
MCV: 93.6 fL (ref 80.0–100.0)
Monocytes Absolute: 0.5 10*3/uL (ref 0.1–1.0)
Monocytes Relative: 11 %
Neutro Abs: 2.3 10*3/uL (ref 1.7–7.7)
Neutrophils Relative %: 51 %
Platelets: 174 10*3/uL (ref 150–400)
RBC: 3.73 MIL/uL — ABNORMAL LOW (ref 4.22–5.81)
RDW: 15.6 % — ABNORMAL HIGH (ref 11.5–15.5)
WBC: 4.4 10*3/uL (ref 4.0–10.5)
nRBC: 0 % (ref 0.0–0.2)

## 2022-08-29 LAB — COMPREHENSIVE METABOLIC PANEL
ALT: 13 U/L (ref 0–44)
AST: 29 U/L (ref 15–41)
Albumin: 3.4 g/dL — ABNORMAL LOW (ref 3.5–5.0)
Alkaline Phosphatase: 38 U/L (ref 38–126)
Anion gap: 6 (ref 5–15)
BUN: 12 mg/dL (ref 8–23)
CO2: 24 mmol/L (ref 22–32)
Calcium: 9.1 mg/dL (ref 8.9–10.3)
Chloride: 105 mmol/L (ref 98–111)
Creatinine, Ser: 0.72 mg/dL (ref 0.61–1.24)
GFR, Estimated: >60 mL/min (ref >60)
Glucose, Bld: 100 mg/dL — ABNORMAL HIGH (ref 70–99)
Potassium: 3.7 mmol/L (ref 3.5–5.1)
Sodium: 135 mmol/L (ref 135–145)
Total Bilirubin: 1.2 mg/dL (ref 0.3–1.2)
Total Protein: 6.3 g/dL — ABNORMAL LOW (ref 6.5–8.1)

## 2022-08-29 LAB — PROTEIN, URINE, RANDOM: Total Protein, Urine: 42 mg/dL

## 2022-08-29 MED ORDER — FLUOROURACIL CHEMO INJECTION 2.5 GM/50ML
400.0000 mg/m2 | Freq: Once | INTRAVENOUS | Status: AC
  Administered 2022-08-29: 1000 mg via INTRAVENOUS
  Filled 2022-08-29: qty 20

## 2022-08-29 MED ORDER — SODIUM CHLORIDE 0.9 % IV SOLN
2400.0000 mg/m2 | INTRAVENOUS | Status: DC
  Administered 2022-08-29: 5000 mg via INTRAVENOUS
  Filled 2022-08-29: qty 100

## 2022-08-29 MED ORDER — SODIUM CHLORIDE 0.9 % IV SOLN
Freq: Once | INTRAVENOUS | Status: AC
  Filled 2022-08-29: qty 250

## 2022-08-29 MED ORDER — SODIUM CHLORIDE 0.9 % IV SOLN
5.0000 mg/kg | Freq: Once | INTRAVENOUS | Status: AC
  Administered 2022-08-29: 500 mg via INTRAVENOUS
  Filled 2022-08-29: qty 20

## 2022-08-29 MED ORDER — SODIUM CHLORIDE 0.9 % IV SOLN
400.0000 mg/m2 | Freq: Once | INTRAVENOUS | Status: AC
  Administered 2022-08-29: 916 mg via INTRAVENOUS
  Filled 2022-08-29: qty 45.8

## 2022-08-29 MED ORDER — HEPARIN SOD (PORK) LOCK FLUSH 100 UNIT/ML IV SOLN
500.0000 [IU] | Freq: Once | INTRAVENOUS | Status: DC | PRN
  Filled 2022-08-29: qty 5

## 2022-08-29 MED ORDER — SODIUM CHLORIDE 0.9% FLUSH
10.0000 mL | INTRAVENOUS | Status: DC | PRN
  Filled 2022-08-29: qty 10

## 2022-08-29 MED ORDER — SODIUM CHLORIDE 0.9 % IV SOLN
10.0000 mg | Freq: Once | INTRAVENOUS | Status: AC
  Administered 2022-08-29: 10 mg via INTRAVENOUS
  Filled 2022-08-29: qty 1

## 2022-08-29 NOTE — Assessment & Plan Note (Signed)
Overall he tolerates chemotherapy.   CEA is relatively stable with small fluctuations. CT images were reviewed and findings were discussed.  Small lung nodules, not able to further characterize due to size.  Will repeat CT in 3 months Labs reviewed and discussed with patient Proceed with 5-FU/bevacizumab treatment today Continue current regimen.  

## 2022-08-29 NOTE — Progress Notes (Signed)
Hematology/Oncology Progress note [phone] [phone]    Chief Complaint: Jared Gutierrez is a 82 y.o. male presents for follow-up of metastatic colon cancer   ASSESSMENT & PLAN:   Cancer Staging  Cancer of transverse colon Staging form: Colon and Rectum, [name], [name], [name], MD on 10/26/2021   Cancer of transverse colon (HCC) Overall he tolerates chemotherapy.   CEA is relatively stable with small fluctuations. CT images were reviewed and findings were discussed.  Small lung nodules, not able to further characterize due to size.  Will repeat CT in 3 months Labs reviewed and discussed with patient Proceed with 5-FU/bevacizumab treatment today Continue current regimen.   Encounter for antineoplastic chemotherapy Chemotherapy plan as listed above.   Anemia due to chemotherapy Mild anemia. continue to monitor.  Hemoglobin is close to baseline.    Follow-up 2 weeks lab MD 5-FU/bevacizumab -   All questions were answered. The patient knows to call the clinic with any problems, questions or concerns.  [name], MD, PhD [facility] Hematology Oncology 08/29/2022     PERTINENT ONCOLOGY HISTORY Jared Gutierrez is a 82 y.o.[place] who has above oncology history reviewed by me today presented for follow up visit for management of Metastatic colon cancer. [name], patient switched care to me on 11/11/20 Extensive medical record review was performed by me  Oncology History  History of colon cancer, stage I  10/02/2014 Pathology Results   [id number] colonoscopy pathology A. CECAL POLYPS; HOT SNARE AND COLD BIOPSY:  - TUBULAR ADENOMA, MULTIPLE FRAGMENTS.  - NEGATIVE FOR HIGH-GRADE DYSPLASIA AND MALIGNANCY.   B. COLON POLYP, TRANSVERSE; COLD BIOPSY:  - TUBULAR ADENOMA.  - NEGATIVE FOR HIGH-GRADE DYSPLASIA AND MALIGNANCY.     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  Tumor extended into the submucosa. Margins were negative. + lymphovascular invasion. 14 lymph nodes were negative. Pathologic stage was T1 N0.  TRANSVERSE COLON, RESECTION:  - INVASIVE ADENOCARCINOMA ARISING IN A 4.6 CM TUBULOVILLOUS  ADENOMA WITH HIGH GRADE DYSPLASIA (MALIGNANT POLYP).  - SEE SUMMARY BELOW.  [name]  ONCOLOGY SUMMARY: COLON AND RECTUM, RESECTION, AJCC 7TH EDITION  Specimen: transverse colon  Procedure: transverse colon resection  Tumor site: splenic flexure  Tumor size: 1.0 cm (invasive component)  Macroscopic tumor perforation: not specified  Histologic type: invasive adenocarcinoma  Histologic grade: moderately differentiated  Microscopic tumor extension: into submucosa  Margins:       Proximal margin: negative       Distal margin: negative       Circumferential (radial) or mesenteric margin: negative       If all margins uninvolved  by invasive carcinoma:       Distance of invasive carcinoma from closest margin: 7.5 cm  to distal margin  Treatment effect: not applicable  Lymph-vascular invasion: present  Perineural invasion: not identified  Tumor deposits (discontinuous extramural extension): not   identified  Pathologic staging:       Primary tumor: pT1       Regional lymph nodes: pN0            Number of nodes examined: 14            Number of nodes involved: 0    04/02/2019 Progression   04/02/2019  PET scan  limited evealed a 2.4 x 2.3 cm (SUV 11) hypermetabolic soft tissue density caudal and anterior to the pancreatic neck favored to represent isolated peritoneal or nodal metastasis in the setting of prior transverse colonic resection (expected primary drainage). Although this was immediately adjacent to the pancreas, a fat plane was maintained, arguing strongly against a pancreatic primary. Otherwise, there was no evidence of hypermetabolic metastasis. 04/17/2019 EUS on  revealed a normal esophagus, stomach, duodenum, and pancreas.  There was a 2.4 x 2.4 cm irregular mass in the retroperitoneum adjacent to, but not involving the pancreatic neck.  FNA and core needle biopsy were performed.  Pathology revealed adenocarcinoma compatible with a metastatic lesion of colorectal origin.  Tumor cells were positive for CK20 and CDX2 and negative for CK7.  NGS: [employer] on 05/15/2019 revealed + [id number] and TP53.  Negative results included BRAF V600E, Her2, NRAS, NTRK, PD-L1 (<1%), and TMB 8.7/Mb (intermediate).  MMR testing from his colon resection on 10/16/2013 was intact with a low probability of MSI-H.   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  He received Neulasta after cycle #4 and #5 secondary to progressive leukopenia.  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  He received a truncated course of FOLFOX with cycle #11 secondary to oxaliplatin reaction.   01/14/2020 Imaging    PET revealed an interval decrease in size and FDG uptake (2.4 x 2.3 cm with SUV 11 to 2.4 x 1.7 cm with SUV 4.09) associated with the previously referenced  soft tissue density caudal and anterior to the pancreatic neck suggesting treatment response. There were no new sites of FDG avid tumor.   08/31/2020 Imaging   08/31/2020 Abdomen and pelvis CT revealed increased size of the index soft tissue lesion inferior and anterior to the pancreatic neck (2.9 x 1.8 cm to 3.5 x 2.9 cm). There were similar prominent retroperitoneal lymph nodes without adenopathy by size criteria. There was no new or enlarging abdominal or pelvic lymph nodes. There were no new interval findings. There was similar circumferential wall thickening of a nondistended urinary bladder, which likely accentuated wall thickening There was hepatic steatosis and aortic atherosclerosis.   11/03/2020, PET showed recurrent peritoneal metastasis in the upper abdomen adjacent to the pancreas.  The lesion is 3.8 x 2.9 cm with SUV of 11.3. No evidence of metastatic peritoneal disease elsewhere in the abdomen pelvis.  No evidence of distant metastasis.   11/03/2020 Imaging   PET scan showed recurrent peritoneal metastasis near pancreas. Given that he has no other distant metastasis. Discussed with radiation oncology. Repeat SBRT may be considered if he does not respond well to systemic chemotherapy.     11/26/2020 -  Chemotherapy  5-FU and bevacizumab.   02/17/2021 Imaging   02/17/2021 CT abdomen pelvis showed partial response. Mild decrease of peritoneal lesion.  Proceed with 5-FU and bevacizumab today.  Given that he is tolerating current regimen with good life quality, partial response.  Shared decision was made to hold off adding additional chemotherapy agents.   06/08/2021, CT chest abdomen pelvis with contrast showed soft tissue mass at the base of mesentery inferior to the pancreatic neck is unchanged in size.  No progressive disease was identified.   09/07/2021, CT chest abdomen pelvis with contrast showed no substantial changes in size of the mesenteric mass, 2.6 cm.  No suspicious new  lesions.  Chronic findings as detailed in the imaging report.   12/05/2021 Imaging   PET scan showed soft tissue mesenteric mass is unchanged in size.  Decreased FDG uptake compared to activity on November 03, 2020.  Fever treated disease.  No new metastatic disease.   03/21/2022 Imaging   CT chest abdomen pelvis  1. Similar size of the peripancreatic presumed peritoneal implant compared to 09/07/2021. 2. No new or progressive disease. 3. Incidental findings, including: Coronary artery atherosclerosis.Aortic Atherosclerosis (ICD10-I70.0). Prostatomegaly with chronic bladder wall thickening, suggesting outlet obstruction.     07/11/2022 Imaging   CT chest abdomen pelvis with contrast showed 1. New 6 mm nodule in the right upper lobe and 2 mm nodule in the left upper lobe. Although small these are concerning. Recommend short follow-up in 3 months 2. Stable low-attenuation lesion along the midbody of the pancreas. Continued attention on follow-up. No new peritoneal mass lesions or ascites. 3. Fatty liver infiltration 4. Colonic surgical changes      Other medical problems Chronic lower extremity edema. 12/24/2018, right lower extremity duplex negative for DVT.  Small right Baker's cyst. 08/16/2019, bilateral lower extremity duplex showed no DVT. 08/16/2019 - 08/17/2019 with right lower extremity cellulitis.  He was unable to bear weight.  He was treated with IV fluids, NSAIDs, colchicine, and broad antibiotics (vancomycin and Cefepime).  He was discharged on indomethacin x 5 days and Keflex 500 mg TID x 5 days.  10/05/2020, colonoscopy showed internal hemorrhoids.  Otherwise normal examination.  INTERVAL HISTORY Jared Gutierrez is a 82 y.o. male who has above history reviewed by me today presents for follow up visit for management of recurrent metastatic colon cancer Patient was accompanied by wife.   Denies fever, chills, nausea, vomiting, diarrhea, chest pain, shortness of breath, abdominal  pain,  He has no new complaints today.    Review of Systems  Constitutional:  Negative for appetite change, chills, diaphoresis, fever and unexpected weight change.  HENT:   Negative for hearing loss, nosebleeds, sore throat and tinnitus.   Respiratory:  Negative for cough, hemoptysis and shortness of breath.   Cardiovascular:  Negative for chest pain and palpitations.  Gastrointestinal:  Negative for abdominal pain, blood in stool, constipation, diarrhea, nausea and vomiting.  Genitourinary:  Negative for dysuria, frequency and hematuria.   Musculoskeletal:  Positive for arthralgias. Negative for back pain, myalgias and neck pain.  Skin:  Negative for itching and rash.  Neurological:  Positive for numbness. Negative for dizziness and headaches.  Hematological:  Does not bruise/bleed easily.  Psychiatric/Behavioral:  Negative for depression. The patient is not nervous/anxious.       Past Medical History:  Diagnosis Date   Arthritis    OSTEOARTHRITIS   Cancer    Cavitary lesion of lung    RIGHT LOWER LOBE   Chicken pox  Colon cancer    History of kidney stones    Hypertension    Lipoma of colon    Nephrolithiasis    Nephrolithiasis    Obesity    Shingles    Tubular adenoma of colon    multiple fragments    Past Surgical History:  Procedure Laterality Date   COLON SURGERY     COLONOSCOPY N/A 10/02/2014   Procedure: COLONOSCOPY;  Surgeon: Elnita Maxwell, MD;  Location: [facility] ENDOSCOPY;  Service: Endoscopy;  Laterality: N/A;   COLONOSCOPY WITH PROPOFOL N/A 02/16/2017   Procedure: COLONOSCOPY WITH PROPOFOL;  Surgeon: Scot Jun, MD;  Location: [facility] ENDOSCOPY;  Service: Endoscopy;  Laterality: N/A;   COLONOSCOPY WITH PROPOFOL N/A 10/05/2020   Procedure: COLONOSCOPY WITH PROPOFOL;  Surgeon: Regis Bill, MD;  Location: [facility] ENDOSCOPY;  Service: Endoscopy;  Laterality: N/A;   EUS N/A 04/17/2019   Procedure: FULL UPPER ENDOSCOPIC ULTRASOUND (EUS) RADIAL;   Surgeon: Bearl Mulberry, MD;  Location: [facility] ENDOSCOPY;  Service: Gastroenterology;  Laterality: N/A;   KIDNEY STONE SURGERY     PARTIAL COLECTOMY  10/17/2013   PORTACATH PLACEMENT Right 06/13/2019   Procedure: INSERTION PORT-A-CATH;  Surgeon: Sung Amabile, DO;  Location: [facility] ORS;  Service: General;  Laterality: Right;    Family History  Problem Relation Age of Onset   Cancer Mother    Breast cancer Mother    COPD Father     Social History:  reports that he has never smoked. He has never used smokeless tobacco. He reports current alcohol use. He reports that he does not use drugs.    Allergies: No Known Allergies  Current Medications: Current Outpatient Medications  Medication Sig Dispense Refill   acetaminophen (TYLENOL) 500 MG tablet Take 500 mg by mouth every 6 (six) hours as needed.     amLODipine (NORVASC) 2.5 MG tablet Take 2.5 mg by mouth daily.     aspirin EC 81 MG tablet Take 81 mg by mouth daily.     Calcium Carbonate (CALCIUM 600 PO) Take 1 tablet by mouth 2 (two) times daily.     Cholecalciferol (VITAMIN D) 125 MCG (5000 UT) CAPS Take 5,000 mg by mouth.     HYDROcodone-acetaminophen (NORCO/VICODIN) 5-325 MG tablet Take 1 tablet by mouth every 6 (six) hours as needed for moderate pain (up to 3 doses for moderate pain.).     lidocaine-prilocaine (EMLA) cream APPLY TO AFFECTED AREA ONCE AS DIRECTED 30 g 3   loperamide (IMODIUM) 2 MG capsule Take 1 capsule (2 mg total) by mouth See admin instructions. Take 2 tablets after first loose stool,  then 1 tablet  after each loose stool; maximum: 8 tablets /day 60 capsule 0   loratadine (CLARITIN) 10 MG tablet Take 10 mg by mouth daily.     olmesartan (BENICAR) 20 MG tablet Take 1 tablet by mouth daily.     ondansetron (ZOFRAN) 8 MG tablet Take 1 tablet (8 mg total) by mouth 2 (two) times daily as needed for refractory nausea / vomiting. Start on day 3 after chemotherapy. 60 tablet 1   potassium chloride SA (KLOR-CON M20) 20 MEQ  tablet Take 1 tablet (20 mEq total) by mouth daily. 180 tablet 1   Probiotic Product (PROBIOTIC DAILY PO) Take 1 tablet by mouth daily.     prochlorperazine (COMPAZINE) 10 MG tablet Take 1 tablet (10 mg total) by mouth every 6 (six) hours as needed (NAUSEA). 30 tablet 1   senna-docusate (SENNA S) 8.6-50 MG tablet Take  2 tablets by mouth daily. 60 tablet 3   docusate sodium (COLACE) 100 MG capsule Take 1 capsule (100 mg total) by mouth 2 (two) times daily as needed for mild constipation or moderate constipation. 60 capsule 3   No current facility-administered medications for this visit.   Facility-Administered Medications Ordered in Other Visits  Medication Dose Route Frequency Provider Last Rate Last Admin   0.9 %  sodium chloride infusion   Intravenous Once Corcoran, Melissa C, MD       0.9 %  sodium chloride infusion   Intravenous Continuous Rosey Bath, MD 10 mL/hr at 12/31/19 1000 New Bag at 10/05/20 1045   fluorouracil (ADRUCIL) 5,000 mg in sodium chloride 0.9 % 150 mL chemo infusion  2,400 mg/m2 (Treatment Plan Recorded) Intravenous 1 day or 1 dose [name], MD   Infusion Verify at 08/29/22 1158   heparin lock flush 100 unit/mL  500 Units Intravenous Once Nelva Nay C, MD       heparin lock flush 100 unit/mL  500 Units Intracatheter Once PRN [name], MD       sodium chloride flush (NS) 0.9 % injection 10 mL  10 mL Intracatheter PRN [name], MD        Performance status (ECOG): 1  Vitals Blood pressure (!) 123/90, pulse 90, temperature 98.4 F (36.9 C), resp. rate 18, weight 226 lb 11.2 oz (102.8 kg).   Physical Exam Vitals and nursing note reviewed.  Constitutional:      General: He is not in acute distress.    Appearance: He is well-developed. He is obese. He is not diaphoretic.     Interventions: Face mask in place.  HENT:     Head: Normocephalic and atraumatic.  Eyes:     General: No scleral icterus.    Extraocular Movements: Extraocular movements intact.      Conjunctiva/sclera: Conjunctivae normal.     Pupils: Pupils are equal, round, and reactive to light.  Cardiovascular:     Rate and Rhythm: Normal rate and regular rhythm.     Heart sounds: Normal heart sounds. No murmur heard. Pulmonary:     Effort: Pulmonary effort is normal. No respiratory distress.     Breath sounds: No wheezing.     Comments: Decreased breath sound bilaterally Abdominal:     General: There is no distension.     Palpations: Abdomen is soft. There is no mass.     Tenderness: There is no abdominal tenderness.  Musculoskeletal:        General: No swelling or tenderness. Normal range of motion.     Cervical back: Normal range of motion and neck supple.     Comments: Trace edema bilaterally.   Lymphadenopathy:     Head:     Right side of head: No preauricular, posterior auricular or occipital adenopathy.     Left side of head: No preauricular, posterior auricular or occipital adenopathy.     Cervical: No cervical adenopathy.     Upper Body:     Right upper body: No supraclavicular or axillary adenopathy.     Left upper body: No supraclavicular or axillary adenopathy.     Lower Body: No right inguinal adenopathy. No left inguinal adenopathy.  Skin:    General: Skin is warm and dry.  Neurological:     Mental Status: He is alert and oriented to person, place, and time. Mental status is at baseline.  Psychiatric:        Mood and Affect:  Mood normal.    Labs were reviewed by me.     Latest Ref Rng & Units 08/29/2022    8:53 AM 08/15/2022    8:38 AM 08/01/2022    8:24 AM  CBC  WBC 4.0 - 10.5 K/uL 4.4  4.2  4.5   Hemoglobin 13.0 - 17.0 g/dL 11.5  72.6  20.3   Hematocrit 39.0 - 52.0 % 34.9  34.8  36.4   Platelets 150 - 400 K/uL 174  149  177       Latest Ref Rng & Units 08/29/2022    8:53 AM 08/15/2022    8:38 AM 08/01/2022    8:24 AM  CMP  Glucose 70 - 99 mg/dL 559  741  638   BUN 8 - 23 mg/dL 12  10  12    Creatinine 0.61 - 1.24 mg/dL 4.53  6.46  8.03    Sodium 135 - 145 mmol/L 135  134  135   Potassium 3.5 - 5.1 mmol/L 3.7  3.5  3.6   Chloride 98 - 111 mmol/L 105  102  102   CO2 22 - 32 mmol/L 24  25  24    Calcium 8.9 - 10.3 mg/dL 9.1  9.1  9.3   Total Protein 6.5 - 8.1 g/dL 6.3  6.3  6.6   Total Bilirubin 0.3 - 1.2 mg/dL 1.2  1.4  1.2   Alkaline Phos 38 - 126 U/L 38  42  43   AST 15 - 41 U/L 29  37  39   ALT 0 - 44 U/L 13  14  15

## 2022-08-29 NOTE — Patient Instructions (Signed)
[facility] [place] [facility]  Discharge Instructions: Thank you for choosing [facility] to provide your oncology and hematology care.  If you have a lab appointment with the Cancer Center, please go directly to the Cancer Center and check in at the registration area.  Wear comfortable clothing and clothing appropriate for easy access to any Portacath or PICC line.   We strive to give you quality time with your provider. You may need to reschedule your appointment if you arrive late (15 or more minutes).  Arriving late affects you and other patients whose appointments are after yours.  Also, if you miss three or more appointments without notifying the office, you may be dismissed from the clinic at the provider's discretion.      For prescription refill requests, have your pharmacy contact our office and allow 72 hours for refills to be completed.    Today you received the following chemotherapy and/or immunotherapy agents BEVACIZAMAB,    LEUCOVORIN, 5 FU   To help prevent nausea and vomiting after your treatment, we encourage you to take your nausea medication as directed.  BELOW ARE SYMPTOMS THAT SHOULD BE REPORTED IMMEDIATELY: *FEVER GREATER THAN 100.4 F (38 C) OR HIGHER *CHILLS OR SWEATING *NAUSEA AND VOMITING THAT IS NOT CONTROLLED WITH YOUR NAUSEA MEDICATION *UNUSUAL SHORTNESS OF BREATH *UNUSUAL BRUISING OR BLEEDING *URINARY PROBLEMS (pain or burning when urinating, or frequent urination) *BOWEL PROBLEMS (unusual diarrhea, constipation, pain near the anus) TENDERNESS IN MOUTH AND THROAT WITH OR WITHOUT PRESENCE OF ULCERS (sore throat, sores in mouth, or a toothache) UNUSUAL RASH, SWELLING OR PAIN  UNUSUAL VAGINAL DISCHARGE OR ITCHING   Items with * indicate a potential emergency and should be followed up as soon as possible or go to the Emergency Department if any problems should occur.  Please show the CHEMOTHERAPY ALERT CARD or IMMUNOTHERAPY ALERT  CARD at check-in to the Emergency Department and triage nurse.  Should you have questions after your visit or need to cancel or reschedule your appointment, please contact [facility] [place] [facility]  [phone] and follow the prompts.  Office hours are 8:00 a.m. to 4:30 p.m. Monday - Friday. Please note that voicemails left after 4:00 p.m. may not be returned until the following business day.  We are closed weekends and major holidays. You have access to a nurse at all times for urgent questions. Please call the main number to the clinic [phone] and follow the prompts.  For any non-urgent questions, you may also contact your provider using MyChart. We now offer e-Visits for anyone 18 and older to request care online for non-urgent symptoms. For details visit [email or web address].   Also download the MyChart app! Go to the app store, search "MyChart", open the app, select [facility], and log in with your MyChart username and password.  Bevacizumab Injection What is this medication? BEVACIZUMAB (be va SIZ yoo mab) treats some types of cancer. It works by blocking a protein that causes cancer cells to grow and multiply. This helps to slow or stop the spread of cancer cells. It is a monoclonal antibody. This medicine may be used for other purposes; ask your health care provider or pharmacist if you have questions. COMMON BRAND NAME(S): Alymsys, Avastin, MVASI, Zirabev What should I tell my care team before I take this medication? They need to know if you have any of these conditions: Blood clots Coughing up blood Having or recent surgery Heart failure High blood pressure History   of a connection between 2 or more body parts that do not usually connect (fistula) History of a tear in your stomach or intestines Protein in your urine An unusual or allergic reaction to bevacizumab, other medications, foods, dyes, or preservatives Pregnant or trying to get  pregnant Breast-feeding How should I use this medication? This medication is injected into a vein. It is given by your care team in a hospital or clinic setting. Talk to your care team the use of this medication in children. Special care may be needed. Overdosage: If you think you have taken too much of this medicine contact a poison control center or emergency room at once. NOTE: This medicine is only for you. Do not share this medicine with others. What if I miss a dose? Keep appointments for follow-up doses. It is important not to miss your dose. Call your care team if you are unable to keep an appointment. What may interact with this medication? Interactions are not expected. This list may not describe all possible interactions. Give your health care provider a list of all the medicines, herbs, non-prescription drugs, or dietary supplements you use. Also tell them if you smoke, drink alcohol, or use illegal drugs. Some items may interact with your medicine. What should I watch for while using this medication? Your condition will be monitored carefully while you are receiving this medication. You may need blood work while taking this medication. This medication may make you feel generally unwell. This is not uncommon as chemotherapy can affect healthy cells as well as cancer cells. Report any side effects. Continue your course of treatment even though you feel ill unless your care team tells you to stop. This medication may increase your risk to bruise or bleed. Call your care team if you notice any unusual bleeding. Before having surgery, talk to your care team to make sure it is ok. This medication can increase the risk of poor healing of your surgical site or wound. You will need to stop this medication for 28 days before surgery. After surgery, wait at least 28 days before restarting this medication. Make sure the surgical site or wound is healed enough before restarting this medication. Talk  to your care team if questions. Talk to your care team if you may be pregnant. Serious birth defects can occur if you take this medication during pregnancy and for 6 months after the last dose. Contraception is recommended while taking this medication and for 6 months after the last dose. Your care team can help you find the option that works for you. Do not breastfeed while taking this medication and for 6 months after the last dose. This medication can cause infertility. Talk to your care team if you are concerned about your fertility. What side effects may I notice from receiving this medication? Side effects that you should report to your care team as soon as possible: Allergic reactions--skin rash, itching, hives, swelling of the face, lips, tongue, or throat Bleeding--bloody or black, tar-like stools, vomiting blood or brown material that looks like coffee grounds, red or dark brown urine, small red or purple spots on skin, unusual bruising or bleeding Blood clot--pain, swelling, or warmth in the leg, shortness of breath, chest pain Heart attack--pain or tightness in the chest, shoulders, arms, or jaw, nausea, shortness of breath, cold or clammy skin, feeling faint or lightheaded Heart failure--shortness of breath, swelling of the ankles, feet, or hands, sudden weight gain, unusual weakness or fatigue Increase in blood pressure   Infection--fever, chills, cough, sore throat, wounds that don't heal, pain or trouble when passing urine, general feeling of discomfort or being unwell Infusion reactions--chest pain, shortness of breath or trouble breathing, feeling faint or lightheaded Kidney injury--decrease in the amount of urine, swelling of the ankles, hands, or feet Stomach pain that is severe, does not go away, or gets worse Stroke--sudden numbness or weakness of the face, arm, or leg, trouble speaking, confusion, trouble walking, loss of balance or coordination, dizziness, severe headache,  change in vision Sudden and severe headache, confusion, change in vision, seizures, which may be signs of posterior reversible encephalopathy syndrome (PRES) Side effects that usually do not require medical attention (report to your care team if they continue or are bothersome): Back pain Change in taste Diarrhea Dry skin Increased tears Nosebleed This list may not describe all possible side effects. Call your doctor for medical advice about side effects. You may report side effects to FDA at 1-800-FDA-1088. Where should I keep my medication? This medication is given in a hospital or clinic. It will not be stored at home. NOTE: This sheet is a summary. It may not cover all possible information. If you have questions about this medicine, talk to your doctor, pharmacist, or health care provider.  2023 Elsevier/Gold Standard (2021-09-02 00:00:00)  Leucovorin Injection What is this medication? LEUCOVORIN (loo koe VOR in) prevents side effects from certain medications, such as methotrexate. It works by increasing folate levels. This helps protect healthy cells in your body. It may also be used to treat anemia caused by low levels of folate. It can also be used with fluorouracil, a type of chemotherapy, to treat colorectal cancer. It works by increasing the effects of fluorouracil in the body. This medicine may be used for other purposes; ask your health care provider or pharmacist if you have questions. What should I tell my care team before I take this medication? They need to know if you have any of these conditions: Anemia from low levels of vitamin B12 in the blood An unusual or allergic reaction to leucovorin, folic acid, other medications, foods, dyes, or preservatives Pregnant or trying to get pregnant Breastfeeding How should I use this medication? This medication is injected into a vein or a muscle. It is given by your care team in a hospital or clinic setting. Talk to your care team  about the use of this medication in children. Special care may be needed. Overdosage: If you think you have taken too much of this medicine contact a poison control center or emergency room at once. NOTE: This medicine is only for you. Do not share this medicine with others. What if I miss a dose? Keep appointments for follow-up doses. It is important not to miss your dose. Call your care team if you are unable to keep an appointment. What may interact with this medication? Capecitabine Fluorouracil Phenobarbital Phenytoin Primidone Trimethoprim;sulfamethoxazole This list may not describe all possible interactions. Give your health care provider a list of all the medicines, herbs, non-prescription drugs, or dietary supplements you use. Also tell them if you smoke, drink alcohol, or use illegal drugs. Some items may interact with your medicine. What should I watch for while using this medication? Your condition will be monitored carefully while you are receiving this medication. This medication may increase the side effects of 5-fluorouracil. Tell your care team if you have diarrhea or mouth sores that do not get better or that get worse. What side effects may I notice   from receiving this medication? Side effects that you should report to your care team as soon as possible: Allergic reactions--skin rash, itching, hives, swelling of the face, lips, tongue, or throat This list may not describe all possible side effects. Call your doctor for medical advice about side effects. You may report side effects to FDA at 1-800-FDA-1088. Where should I keep my medication? This medication is given in a hospital or clinic. It will not be stored at home. NOTE: This sheet is a summary. It may not cover all possible information. If you have questions about this medicine, talk to your doctor, pharmacist, or health care provider.  2023 Elsevier/Gold Standard (2021-09-09 00:00:00)  Fluorouracil Injection What  is this medication? FLUOROURACIL (flure oh YOOR a sil) treats some types of cancer. It works by slowing down the growth of cancer cells. This medicine may be used for other purposes; ask your health care provider or pharmacist if you have questions. COMMON BRAND NAME(S): Adrucil What should I tell my care team before I take this medication? They need to know if you have any of these conditions: Blood disorders Dihydropyrimidine dehydrogenase (DPD) deficiency Infection, such as chickenpox, cold sores, herpes Kidney disease Liver disease Poor nutrition Recent or ongoing radiation therapy An unusual or allergic reaction to fluorouracil, other medications, foods, dyes, or preservatives If you or your partner are pregnant or trying to get pregnant Breast-feeding How should I use this medication? This medication is injected into a vein. It is administered by your care team in a hospital or clinic setting. Talk to your care team about the use of this medication in children. Special care may be needed. Overdosage: If you think you have taken too much of this medicine contact a poison control center or emergency room at once. NOTE: This medicine is only for you. Do not share this medicine with others. What if I miss a dose? Keep appointments for follow-up doses. It is important not to miss your dose. Call your care team if you are unable to keep an appointment. What may interact with this medication? Do not take this medication with any of the following: Live virus vaccines This medication may also interact with the following: Medications that treat or prevent blood clots, such as warfarin, enoxaparin, dalteparin This list may not describe all possible interactions. Give your health care provider a list of all the medicines, herbs, non-prescription drugs, or dietary supplements you use. Also tell them if you smoke, drink alcohol, or use illegal drugs. Some items may interact with your  medicine. What should I watch for while using this medication? Your condition will be monitored carefully while you are receiving this medication. This medication may make you feel generally unwell. This is not uncommon as chemotherapy can affect healthy cells as well as cancer cells. Report any side effects. Continue your course of treatment even though you feel ill unless your care team tells you to stop. In some cases, you may be given additional medications to help with side effects. Follow all directions for their use. This medication may increase your risk of getting an infection. Call your care team for advice if you get a fever, chills, sore throat, or other symptoms of a cold or flu. Do not treat yourself. Try to avoid being around people who are sick. This medication may increase your risk to bruise or bleed. Call your care team if you notice any unusual bleeding. Be careful brushing or flossing your teeth or using a toothpick because   you may get an infection or bleed more easily. If you have any dental work done, tell your dentist you are receiving this medication. Avoid taking medications that contain aspirin, acetaminophen, ibuprofen, naproxen, or ketoprofen unless instructed by your care team. These medications may hide a fever. Do not treat diarrhea with over the counter products. Contact your care team if you have diarrhea that lasts more than 2 days or if it is severe and watery. This medication can make you more sensitive to the sun. Keep out of the sun. If you cannot avoid being in the sun, wear protective clothing and sunscreen. Do not use sun lamps, tanning beds, or tanning booths. Talk to your care team if you or your partner wish to become pregnant or think you might be pregnant. This medication can cause serious birth defects if taken during pregnancy and for 3 months after the last dose. A reliable form of contraception is recommended while taking this medication and for 3 months  after the last dose. Talk to your care team about effective forms of contraception. Do not father a child while taking this medication and for 3 months after the last dose. Use a condom while having sex during this time period. Do not breastfeed while taking this medication. This medication may cause infertility. Talk to your care team if you are concerned about your fertility. What side effects may I notice from receiving this medication? Side effects that you should report to your care team as soon as possible: Allergic reactions--skin rash, itching, hives, swelling of the face, lips, tongue, or throat Heart attack--pain or tightness in the chest, shoulders, arms, or jaw, nausea, shortness of breath, cold or clammy skin, feeling faint or lightheaded Heart failure--shortness of breath, swelling of the ankles, feet, or hands, sudden weight gain, unusual weakness or fatigue Heart rhythm changes--fast or irregular heartbeat, dizziness, feeling faint or lightheaded, chest pain, trouble breathing High ammonia level--unusual weakness or fatigue, confusion, loss of appetite, nausea, vomiting, seizures Infection--fever, chills, cough, sore throat, wounds that don't heal, pain or trouble when passing urine, general feeling of discomfort or being unwell Low red blood cell level--unusual weakness or fatigue, dizziness, headache, trouble breathing Pain, tingling, or numbness in the hands or feet, muscle weakness, change in vision, confusion or trouble speaking, loss of balance or coordination, trouble walking, seizures Redness, swelling, and blistering of the skin over hands and feet Severe or prolonged diarrhea Unusual bruising or bleeding Side effects that usually do not require medical attention (report to your care team if they continue or are bothersome): Dry skin Headache Increased tears Nausea Pain, redness, or swelling with sores inside the mouth or throat Sensitivity to light Vomiting This list  may not describe all possible side effects. Call your doctor for medical advice about side effects. You may report side effects to FDA at 1-800-FDA-1088. Where should I keep my medication? This medication is given in a hospital or clinic. It will not be stored at home. NOTE: This sheet is a summary. It may not cover all possible information. If you have questions about this medicine, talk to your doctor, pharmacist, or health care provider.  2023 Elsevier/Gold Standard (2021-08-30 00:00:00)     

## 2022-08-29 NOTE — Assessment & Plan Note (Signed)
Mild anemia. continue to monitor.  Hemoglobin is close to baseline. 

## 2022-08-29 NOTE — Progress Notes (Signed)
Pt here for follow up. No new concerns voiced.   

## 2022-08-29 NOTE — Assessment & Plan Note (Signed)
Chemotherapy plan as listed above 

## 2022-08-29 NOTE — Patient Instructions (Signed)
Implanted Port Removal  Implanted port removal is a procedure to remove the port and catheter that are implanted under your skin. The port is a small disc under your skin that can be punctured with a needle. It is connected to a vein in your chest or neck by a small, thin tube (catheter). The implanted port is used to give medicines for treatments, and it may also be used to take blood samples. Your health care provider will remove the implanted port if: You no longer need it for treatment. It is not working properly. The area around it gets infected. Tell a health care provider about: Any allergies you have. All medicines you are taking, including vitamins, herbs, eye drops, creams, and over-the-counter medicines. Any problems you or family members have had with anesthetic medicines. Any bleeding problems you have. Any surgeries you have had. Any medical conditions you have. Whether you are pregnant or may be pregnant. What are the risks? Generally, this is a safe procedure. However, problems may occur, including: Infection. Bleeding. Allergic reactions to anesthetic medicines. Damage to nerves or blood vessels. What happens before the procedure? Medicines Ask your health care provider about: Changing or stopping your regular medicines. This is especially important if you are taking diabetes medicines or blood thinners. Taking medicines such as aspirin and ibuprofen. These medicines can thin your blood. Do not take these medicines unless your health care provider tells you to take them. Taking over-the-counter medicines, vitamins, herbs, and supplements. Tests You will have: A physical exam. Blood tests. Imaging tests, including a chest X-ray. General instructions Follow instructions from your health care provider about eating or drinking restrictions. Ask your health care provider: How your surgery site will be marked. What steps will be taken to help prevent infection. These  steps may include: Removing hair at the surgery site. Washing skin with a germ-killing soap. Taking antibiotic medicine. If you will be going home right after the procedure, plan to have a responsible adult: Take you home from the hospital or clinic. You will not be allowed to drive. Care for you for the time you are told. What happens during the procedure? You may be given one or more of the following: A medicine to help you relax (sedative). A medicine to numb the area (local anesthetic). A small incision will be made at the site of your implanted port. The implanted port and the catheter that has been inside your vein will be gently removed. The port and catheter will be inspected to make sure that all the parts have been removed. Part of the catheter may be tested for bacteria. The incision will be closed with stitches (sutures), adhesive strips, or skin glue. A bandage (dressing) will be placed over the incision. The health care provider may apply gentle pressure over the dressing for about 5 minutes. The procedure may vary among health care providers and hospitals. What happens after the procedure? Your blood pressure, heart rate, breathing rate, and blood oxygen level will be monitored until you leave the hospital or clinic. You will be monitored to make sure that there is no bleeding from the site where the port was removed. If you were given a sedative during the procedure, it can affect you for several hours. Do not drive or operate machinery until your health care provider says that it is safe. Summary Implanted port removal is a procedure to remove the port and catheter that are implanted under your skin. Before the procedure, follow your health   care provider's instructions about changing or stopping your regular medicines. This is especially important if you are taking diabetes medicines or blood thinners. If you will be going home right after the procedure, plan to have a  responsible adult care for you for the time you are told. This information is not intended to replace advice given to you by your health care provider. Make sure you discuss any questions you have with your health care provider. Document Revised: 11/02/2020 Document Reviewed: 11/02/2020 Elsevier Patient Education  2023 [employer]vier Inc.  

## 2022-08-31 ENCOUNTER — Inpatient Hospital Stay: Payer: Medicare Other

## 2022-08-31 VITALS — BP 150/72 | HR 74 | Resp 18

## 2022-08-31 DIAGNOSIS — Z5112 Encounter for antineoplastic immunotherapy: Secondary | ICD-10-CM | POA: Diagnosis not present

## 2022-08-31 DIAGNOSIS — C786 Secondary malignant neoplasm of retroperitoneum and peritoneum: Secondary | ICD-10-CM

## 2022-08-31 DIAGNOSIS — Z85038 Personal history of other malignant neoplasm of large intestine: Secondary | ICD-10-CM

## 2022-08-31 MED ORDER — HEPARIN SOD (PORK) LOCK FLUSH 100 UNIT/ML IV SOLN
500.0000 [IU] | Freq: Once | INTRAVENOUS | Status: AC | PRN
  Administered 2022-08-31: 500 [IU]
  Filled 2022-08-31: qty 5

## 2022-08-31 MED ORDER — SODIUM CHLORIDE 0.9% FLUSH
10.0000 mL | INTRAVENOUS | Status: DC | PRN
  Administered 2022-08-31: 10 mL
  Filled 2022-08-31: qty 10

## 2022-09-11 MED FILL — Dexamethasone Sodium Phosphate Inj 100 MG/10ML: INTRAMUSCULAR | Qty: 1 | Status: AC

## 2022-09-12 ENCOUNTER — Inpatient Hospital Stay (HOSPITAL_BASED_OUTPATIENT_CLINIC_OR_DEPARTMENT_OTHER): Payer: Medicare Other | Admitting: Oncology

## 2022-09-12 ENCOUNTER — Inpatient Hospital Stay: Payer: Medicare Other

## 2022-09-12 ENCOUNTER — Encounter: Payer: Self-pay | Admitting: Oncology

## 2022-09-12 VITALS — BP 132/69 | HR 71 | Temp 96.7°F | Resp 18 | Wt 228.6 lb

## 2022-09-12 DIAGNOSIS — Z85038 Personal history of other malignant neoplasm of large intestine: Secondary | ICD-10-CM

## 2022-09-12 DIAGNOSIS — T451X5A Adverse effect of antineoplastic and immunosuppressive drugs, initial encounter: Secondary | ICD-10-CM

## 2022-09-12 DIAGNOSIS — C786 Secondary malignant neoplasm of retroperitoneum and peritoneum: Secondary | ICD-10-CM | POA: Diagnosis not present

## 2022-09-12 DIAGNOSIS — Z5111 Encounter for antineoplastic chemotherapy: Secondary | ICD-10-CM | POA: Diagnosis not present

## 2022-09-12 DIAGNOSIS — C184 Malignant neoplasm of transverse colon: Secondary | ICD-10-CM | POA: Diagnosis not present

## 2022-09-12 DIAGNOSIS — H1130 Conjunctival hemorrhage, unspecified eye: Secondary | ICD-10-CM | POA: Insufficient documentation

## 2022-09-12 DIAGNOSIS — H1131 Conjunctival hemorrhage, right eye: Secondary | ICD-10-CM

## 2022-09-12 DIAGNOSIS — Z5112 Encounter for antineoplastic immunotherapy: Secondary | ICD-10-CM | POA: Diagnosis not present

## 2022-09-12 DIAGNOSIS — D6481 Anemia due to antineoplastic chemotherapy: Secondary | ICD-10-CM

## 2022-09-12 LAB — COMPREHENSIVE METABOLIC PANEL
ALT: 14 U/L (ref 0–44)
AST: 44 U/L — ABNORMAL HIGH (ref 15–41)
Albumin: 3.4 g/dL — ABNORMAL LOW (ref 3.5–5.0)
Alkaline Phosphatase: 42 U/L (ref 38–126)
Anion gap: 10 (ref 5–15)
BUN: 7 mg/dL — ABNORMAL LOW (ref 8–23)
CO2: 21 mmol/L — ABNORMAL LOW (ref 22–32)
Calcium: 8.7 mg/dL — ABNORMAL LOW (ref 8.9–10.3)
Chloride: 103 mmol/L (ref 98–111)
Creatinine, Ser: 0.79 mg/dL (ref 0.61–1.24)
GFR, Estimated: >60 mL/min (ref >60)
Glucose, Bld: 121 mg/dL — ABNORMAL HIGH (ref 70–99)
Potassium: 3.5 mmol/L (ref 3.5–5.1)
Sodium: 134 mmol/L — ABNORMAL LOW (ref 135–145)
Total Bilirubin: 1.5 mg/dL — ABNORMAL HIGH (ref 0.3–1.2)
Total Protein: 6.1 g/dL — ABNORMAL LOW (ref 6.5–8.1)

## 2022-09-12 LAB — CBC WITH DIFFERENTIAL/PLATELET
Abs Immature Granulocytes: 0 10*3/uL (ref 0.00–0.07)
Basophils Absolute: 0 10*3/uL (ref 0.0–0.1)
Basophils Relative: 1 %
Eosinophils Absolute: 0 10*3/uL (ref 0.0–0.5)
Eosinophils Relative: 1 %
HCT: 35.2 % — ABNORMAL LOW (ref 39.0–52.0)
Hemoglobin: 11.4 g/dL — ABNORMAL LOW (ref 13.0–17.0)
Immature Granulocytes: 0 %
Lymphocytes Relative: 46 %
Lymphs Abs: 1.5 10*3/uL (ref 0.7–4.0)
MCH: 30.1 pg (ref 26.0–34.0)
MCHC: 32.4 g/dL (ref 30.0–36.0)
MCV: 92.9 fL (ref 80.0–100.0)
Monocytes Absolute: 0.3 10*3/uL (ref 0.1–1.0)
Monocytes Relative: 9 %
Neutro Abs: 1.5 10*3/uL — ABNORMAL LOW (ref 1.7–7.7)
Neutrophils Relative %: 43 %
Platelets: 181 10*3/uL (ref 150–400)
RBC: 3.79 MIL/uL — ABNORMAL LOW (ref 4.22–5.81)
RDW: 15.3 % (ref 11.5–15.5)
WBC: 3.4 10*3/uL — ABNORMAL LOW (ref 4.0–10.5)
nRBC: 0 % (ref 0.0–0.2)

## 2022-09-12 LAB — PROTEIN, URINE, RANDOM: Total Protein, Urine: 29 mg/dL

## 2022-09-12 MED ORDER — FLUOROURACIL CHEMO INJECTION 2.5 GM/50ML: 400.0000 mg/m2 | Freq: Once | INTRAVENOUS | Status: DC

## 2022-09-12 MED ORDER — SODIUM CHLORIDE 0.9 % IV SOLN
Freq: Once | INTRAVENOUS | Status: AC
  Filled 2022-09-12: qty 250

## 2022-09-12 MED ORDER — SODIUM CHLORIDE 0.9 % IV SOLN
5.0000 mg/kg | Freq: Once | INTRAVENOUS | Status: AC
  Administered 2022-09-12: 500 mg via INTRAVENOUS
  Filled 2022-09-12: qty 20

## 2022-09-12 MED ORDER — SODIUM CHLORIDE 0.9 % IV SOLN
10.0000 mg | Freq: Once | INTRAVENOUS | Status: AC
  Administered 2022-09-12: 10 mg via INTRAVENOUS
  Filled 2022-09-12: qty 10

## 2022-09-12 MED ORDER — SODIUM CHLORIDE 0.9 % IV SOLN
2400.0000 mg/m2 | INTRAVENOUS | Status: DC
  Administered 2022-09-12: 5000 mg via INTRAVENOUS
  Filled 2022-09-12: qty 100

## 2022-09-12 MED ORDER — SODIUM CHLORIDE 0.9 % IV SOLN
400.0000 mg/m2 | Freq: Once | INTRAVENOUS | Status: AC
  Administered 2022-09-12: 916 mg via INTRAVENOUS
  Filled 2022-09-12: qty 45.8

## 2022-09-12 MED ORDER — FLUOROURACIL CHEMO INJECTION 2.5 GM/50ML
400.0000 mg/m2 | Freq: Once | INTRAVENOUS | Status: AC
  Administered 2022-09-12: 900 mg via INTRAVENOUS
  Filled 2022-09-12: qty 18

## 2022-09-12 NOTE — Assessment & Plan Note (Signed)
Reassurance provided. Observation.

## 2022-09-12 NOTE — Progress Notes (Signed)
Hematology/Oncology Progress note [phone] [phone]    Chief Complaint: Jared Gutierrez is a 82 y.o. male presents for follow-up of metastatic colon cancer   ASSESSMENT & PLAN:   Cancer Staging  Cancer of transverse colon [facility]) Staging form: Colon and Rectum, [name], [name], [name], MD on 10/26/2021   Cancer of transverse colon (HCC) Overall he tolerates chemotherapy.   CEA is relatively stable with small fluctuations. CT images were reviewed and findings were discussed.  Small lung nodules, not able to further characterize due to size.  Will repeat CT in 3 months Labs reviewed and discussed with patient Proceed with 5-FU/bevacizumab treatment today Continue current regimen.   Encounter for antineoplastic chemotherapy Chemotherapy plan as listed above.   Anemia due to chemotherapy Mild anemia. continue to monitor.  Hemoglobin is close to baseline.  Subconjunctival hemorrhage Reassurance provided. Observation.     Follow-up 2 weeks lab MD 5-FU/bevacizumab -   All questions were answered. The patient knows to call the clinic with any problems, questions or concerns.  [name], MD, PhD [facility] Hematology Oncology 09/12/2022     PERTINENT ONCOLOGY HISTORY Jared Gutierrez is a 82 y.o.[place] who has above oncology history reviewed by me today presented for follow up visit for management of Metastatic colon cancer. [name], patient switched care to me on 11/11/20 Extensive medical record review was performed by me  Oncology History  History of colon cancer, stage I  10/02/2014 Pathology Results   [id number] colonoscopy pathology A. CECAL POLYPS; HOT SNARE AND COLD BIOPSY:  - TUBULAR ADENOMA, MULTIPLE FRAGMENTS.  - NEGATIVE FOR HIGH-GRADE DYSPLASIA AND MALIGNANCY.   B. COLON POLYP, TRANSVERSE; COLD BIOPSY:  - TUBULAR ADENOMA.  - NEGATIVE  FOR HIGH-GRADE DYSPLASIA AND MALIGNANCY.     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  Tumor extended into the submucosa. Margins were negative. + lymphovascular invasion. 14 lymph nodes were negative. Pathologic stage was T1 N0.  TRANSVERSE COLON, RESECTION:  - INVASIVE ADENOCARCINOMA ARISING IN A 4.6 CM TUBULOVILLOUS  ADENOMA WITH HIGH GRADE DYSPLASIA (MALIGNANT POLYP).  - SEE SUMMARY BELOW.  [name]  ONCOLOGY SUMMARY: COLON AND RECTUM, RESECTION, AJCC 7TH EDITION  Specimen: transverse colon  Procedure: transverse colon resection  Tumor site: splenic flexure  Tumor size: 1.0 cm (invasive component)  Macroscopic tumor perforation: not specified  Histologic type: invasive adenocarcinoma  Histologic grade: moderately differentiated  Microscopic tumor extension: into submucosa  Margins:       Proximal margin: negative       Distal margin: negative       Circumferential (radial) or mesenteric margin: negative  If all margins uninvolved by invasive carcinoma:       Distance of invasive carcinoma from closest margin: 7.5 cm  to distal margin  Treatment effect: not applicable  Lymph-vascular invasion: present  Perineural  invasion: not identified  Tumor deposits (discontinuous extramural extension): not  identified  Pathologic staging:       Primary tumor: pT1       Regional lymph nodes: pN0            Number of nodes examined: 14            Number of nodes involved: 0    04/02/2019 Progression   04/02/2019  PET scan  limited evealed a 2.4 x 2.3 cm (SUV 11) hypermetabolic soft tissue density caudal and anterior to the pancreatic neck favored to represent isolated peritoneal or nodal metastasis in the setting of prior transverse colonic resection (expected primary drainage). Although this was immediately adjacent to the pancreas, a fat plane was maintained, arguing strongly against a pancreatic primary. Otherwise, there was no evidence of hypermetabolic metastasis. 04/17/2019 EUS on  revealed a normal esophagus, stomach, duodenum, and pancreas.  There was a 2.4 x 2.4 cm irregular mass in the retroperitoneum adjacent to, but not involving the pancreatic neck.  FNA and core needle biopsy were performed.  Pathology revealed adenocarcinoma compatible with a metastatic lesion of colorectal origin.  Tumor cells were positive for CK20 and CDX2 and negative for CK7.  NGS: [employer] on 05/15/2019 revealed + KRASG13D and TP53.  Negative results included BRAF V600E, Her2, NRAS, NTRK, PD-L1 (<1%), and TMB 8.7/Mb (intermediate).  MMR testing from his colon resection on 10/16/2013 was intact with a low probability of MSI-H.   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  He received Neulasta after cycle #4 and #5 secondary to progressive leukopenia.  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  He received a truncated course of FOLFOX with cycle #11 secondary to oxaliplatin reaction.   01/14/2020 Imaging    PET revealed an interval decrease in size and FDG uptake (2.4 x 2.3 cm  with SUV 11 to 2.4 x 1.7 cm with SUV 4.09) associated with the previously referenced soft tissue density caudal and anterior to the pancreatic neck suggesting treatment response. There were no new sites of FDG avid tumor.   08/31/2020 Imaging   08/31/2020 Abdomen and pelvis CT revealed increased size of the index soft tissue lesion inferior and anterior to the pancreatic neck (2.9 x 1.8 cm to 3.5 x 2.9 cm). There were similar prominent retroperitoneal lymph nodes without adenopathy by size criteria. There was no new or enlarging abdominal or pelvic lymph nodes. There were no new interval findings. There was similar circumferential wall thickening of a nondistended urinary bladder, which likely accentuated wall thickening There was hepatic steatosis and aortic atherosclerosis.   11/03/2020, PET showed recurrent peritoneal metastasis in the upper abdomen adjacent to the pancreas.  The lesion is 3.8 x 2.9 cm with SUV of 11.3. No evidence of metastatic peritoneal disease elsewhere in the abdomen pelvis.  No evidence of distant metastasis.   11/03/2020 Imaging   PET scan showed recurrent peritoneal metastasis near pancreas. Given that he has no other distant metastasis. Discussed with radiation oncology. Repeat SBRT may be considered if he does not respond well to systemic chemotherapy.     11/26/2020 -  Chemotherapy   5-FU and bevacizumab.   02/17/2021 Imaging   02/17/2021 CT abdomen pelvis showed partial response. Mild decrease of peritoneal lesion.  Proceed with 5-FU and bevacizumab today.  Given that he is tolerating current regimen with good life quality, partial response.  Shared decision was made to hold off adding additional chemotherapy agents.   06/08/2021, CT chest abdomen pelvis with contrast showed soft tissue mass at the base of mesentery inferior to the pancreatic neck is unchanged in size.  No progressive disease was identified.   09/07/2021, CT chest abdomen pelvis with contrast showed no  substantial changes in size of the mesenteric mass, 2.6 cm.  No suspicious new lesions.  Chronic findings as detailed in the imaging report.   12/05/2021 Imaging   PET scan showed soft tissue mesenteric mass is unchanged in size.  Decreased FDG uptake compared to activity on November 03, 2020.  Fever treated disease.  No new metastatic disease.   03/21/2022 Imaging   CT chest abdomen pelvis  1. Similar size of the peripancreatic presumed peritoneal implant compared to 09/07/2021. 2. No new or progressive disease. 3. Incidental findings, including: Coronary artery atherosclerosis.Aortic Atherosclerosis (ICD10-I70.0). Prostatomegaly with chronic bladder wall thickening, suggesting outlet obstruction.     07/11/2022 Imaging   CT chest abdomen pelvis with contrast showed 1. New 6 mm nodule in the right upper lobe and 2 mm nodule in the left upper lobe. Although small these are concerning. Recommend short follow-up in 3 months 2. Stable low-attenuation lesion along the midbody of the pancreas. Continued attention on follow-up. No new peritoneal mass lesions or ascites. 3. Fatty liver infiltration 4. Colonic surgical changes      Other medical problems Chronic lower extremity edema. 12/24/2018, right lower extremity duplex negative for DVT.  Small right Baker's cyst. 08/16/2019, bilateral lower extremity duplex showed no DVT. 08/16/2019 - 08/17/2019 with right lower extremity cellulitis.  He was unable to bear weight.  He was treated with IV fluids, NSAIDs, colchicine, and broad antibiotics (vancomycin and Cefepime).  He was discharged on indomethacin x 5 days and Keflex 500 mg TID x 5 days.  10/05/2020, colonoscopy showed internal hemorrhoids.  Otherwise normal examination.  INTERVAL HISTORY Jared Gutierrez is a 82 y.o. male who has above history reviewed by me today presents for follow up visit for management of recurrent metastatic colon cancer Patient was accompanied by wife.   Denies fever,  chills, nausea, vomiting, diarrhea, chest pain, shortness of breath, abdominal pain,  Wife reports that right eye has a red spot, noticed this morning. No discharge, change of vision.     Review of Systems  Constitutional:  Negative for appetite change, chills, diaphoresis, fever and unexpected weight change.  HENT:   Negative for hearing loss, nosebleeds, sore throat and tinnitus.   Respiratory:  Negative for cough, hemoptysis and shortness of breath.   Cardiovascular:  Negative for chest pain and palpitations.  Gastrointestinal:  Negative for abdominal pain, blood in stool, constipation, diarrhea, nausea and vomiting.  Genitourinary:  Negative for dysuria, frequency and hematuria.   Musculoskeletal:  Positive for arthralgias. Negative for back pain, myalgias and neck pain.  Skin:  Negative for itching and rash.  Neurological:  Positive for numbness. Negative for dizziness and headaches.  Hematological:  Does not bruise/bleed easily.  Psychiatric/Behavioral:  Negative for depression. The patient is not nervous/anxious.       Past Medical History:  Diagnosis Date   Arthritis    OSTEOARTHRITIS   Cancer (HCC)  Cavitary lesion of lung    RIGHT LOWER LOBE   Chicken pox    Colon cancer (HCC)    History of kidney stones    Hypertension    Lipoma of colon    Nephrolithiasis    Nephrolithiasis    Obesity    Shingles    Tubular adenoma of colon    multiple fragments    Past Surgical History:  Procedure Laterality Date   COLON SURGERY     COLONOSCOPY N/A 10/02/2014   Procedure: COLONOSCOPY;  Surgeon: Elnita Maxwell, MD;  Location: [facility] ENDOSCOPY;  Service: Endoscopy;  Laterality: N/A;   COLONOSCOPY WITH PROPOFOL N/A 02/16/2017   Procedure: COLONOSCOPY WITH PROPOFOL;  Surgeon: Scot Jun, MD;  Location[facility] ENDOSCOPY;  Service: Endoscopy;  Laterality: N/A;   COLONOSCOPY WITH PROPOFOL N/A 10/05/2020   Procedure: COLONOSCOPY WITH PROPOFOL;  Surgeon: Regis Bill, MD;  Location: [facility] ENDOSCOPY;  Service: Endoscopy;  Laterality: N/A;   EUS N/A 04/17/2019   Procedure: FULL UPPER ENDOSCOPIC ULTRASOUND (EUS) RADIAL;  Surgeon: Bearl Mulberry, MD;  Location: [facility] ENDOSCOPY;  Service: Gastroenterology;  Laterality: N/A;   KIDNEY STONE SURGERY     PARTIAL COLECTOMY  10/17/2013   PORTACATH PLACEMENT Right 06/13/2019   Procedure: INSERTION PORT-A-CATH;  Surgeon: Sung Amabile, DO;  Location: [facility] ORS;  Service: General;  Laterality: Right;    Family History  Problem Relation Age of Onset   Cancer Mother    Breast cancer Mother    COPD Father     Social History:  reports that he has never smoked. He has never used smokeless tobacco. He reports current alcohol use. He reports that he does not use drugs.    Allergies: No Known Allergies  Current Medications: Current Outpatient Medications  Medication Sig Dispense Refill   acetaminophen (TYLENOL) 500 MG tablet Take 500 mg by mouth every 6 (six) hours as needed.     amLODipine (NORVASC) 2.5 MG tablet Take 2.5 mg by mouth daily.     aspirin EC 81 MG tablet Take 81 mg by mouth daily.     Calcium Carbonate (CALCIUM 600 PO) Take 1 tablet by mouth 2 (two) times daily.     Cholecalciferol (VITAMIN D) 125 MCG (5000 UT) CAPS Take 5,000 mg by mouth.     docusate sodium (COLACE) 100 MG capsule Take 1 capsule (100 mg total) by mouth 2 (two) times daily as needed for mild constipation or moderate constipation. 60 capsule 3   HYDROcodone-acetaminophen (NORCO/VICODIN) 5-325 MG tablet Take 1 tablet by mouth every 6 (six) hours as needed for moderate pain (up to 3 doses for moderate pain.).     lidocaine-prilocaine (EMLA) cream APPLY TO AFFECTED AREA ONCE AS DIRECTED 30 g 3   loperamide (IMODIUM) 2 MG capsule Take 1 capsule (2 mg total) by mouth See admin instructions. Take 2 tablets after first loose stool,  then 1 tablet  after each loose stool; maximum: 8 tablets /day 60 capsule 0   loratadine (CLARITIN) 10 MG  tablet Take 10 mg by mouth daily.     olmesartan (BENICAR) 20 MG tablet Take 1 tablet by mouth daily.     ondansetron (ZOFRAN) 8 MG tablet Take 1 tablet (8 mg total) by mouth 2 (two) times daily as needed for refractory nausea / vomiting. Start on day 3 after chemotherapy. 60 tablet 1   potassium chloride SA (KLOR-CON M20) 20 MEQ tablet Take 1 tablet (20 mEq total) by mouth daily. 180 tablet 1  Probiotic Product (PROBIOTIC DAILY PO) Take 1 tablet by mouth daily.     prochlorperazine (COMPAZINE) 10 MG tablet Take 1 tablet (10 mg total) by mouth every 6 (six) hours as needed (NAUSEA). 30 tablet 1   senna-docusate (SENNA S) 8.6-50 MG tablet Take 2 tablets by mouth daily. 60 tablet 3   No current facility-administered medications for this visit.   Facility-Administered Medications Ordered in Other Visits  Medication Dose Route Frequency Provider Last Rate Last Admin   0.9 %  sodium chloride infusion   Intravenous Once Corcoran, Melissa C, MD       0.9 %  sodium chloride infusion   Intravenous Continuous Nelva Nay C, MD 10 mL/hr at 12/31/19 1000 New Bag at 10/05/20 1045   heparin lock flush 100 unit/mL  500 Units Intravenous Once Rosey Bath, MD        Performance status (ECOG): 1  Vitals Blood pressure 132/69, pulse 71, temperature (!) 96.7 F (35.9 C), temperature source Tympanic, resp. rate 18, weight 228 lb 9.6 oz (103.7 kg), SpO2 100 %.   Physical Exam Vitals and nursing note reviewed.  Constitutional:      General: He is not in acute distress.    Appearance: He is well-developed. He is obese. He is not diaphoretic.     Interventions: Face mask in place.  HENT:     Head: Normocephalic and atraumatic.  Eyes:     General: No scleral icterus.    Comments: subconjunctival hemorrhage right eye  Cardiovascular:     Rate and Rhythm: Normal rate and regular rhythm.     Heart sounds: Normal heart sounds. No murmur heard. Pulmonary:     Effort: Pulmonary effort is normal.  No respiratory distress.     Breath sounds: No wheezing.     Comments: Decreased breath sound bilaterally Abdominal:     General: There is no distension.     Palpations: Abdomen is soft. There is no mass.     Tenderness: There is no abdominal tenderness.  Musculoskeletal:        General: No swelling or tenderness. Normal range of motion.     Cervical back: Normal range of motion and neck supple.     Comments: Trace edema bilaterally.   Lymphadenopathy:     Head:     Right side of head: No preauricular, posterior auricular or occipital adenopathy.     Left side of head: No preauricular, posterior auricular or occipital adenopathy.     Cervical: No cervical adenopathy.     Upper Body:     Right upper body: No supraclavicular or axillary adenopathy.     Left upper body: No supraclavicular or axillary adenopathy.     Lower Body: No right inguinal adenopathy. No left inguinal adenopathy.  Skin:    General: Skin is warm and dry.  Neurological:     Mental Status: He is alert and oriented to person, place, and time. Mental status is at baseline.  Psychiatric:        Mood and Affect: Mood normal.    Labs were reviewed by me.     Latest Ref Rng & Units 08/29/2022    8:53 AM 08/15/2022    8:38 AM 08/01/2022    8:24 AM  CBC  WBC 4.0 - 10.5 K/uL 4.4  4.2  4.5   Hemoglobin 13.0 - 17.0 g/dL 16.1  09.6  04.5   Hematocrit 39.0 - 52.0 % 34.9  34.8  36.4   Platelets 150 -  400 K/uL 174  149  177       Latest Ref Rng & Units 08/29/2022    8:53 AM 08/15/2022    8:38 AM 08/01/2022    8:24 AM  CMP  Glucose 70 - 99 mg/dL 161  096  045   BUN 8 - 23 mg/dL 12  10  12    Creatinine 0.61 - 1.24 mg/dL 4.09  8.11  9.14   Sodium 135 - 145 mmol/L 135  134  135   Potassium 3.5 - 5.1 mmol/L 3.7  3.5  3.6   Chloride 98 - 111 mmol/L 105  102  102   CO2 22 - 32 mmol/L 24  25  24    Calcium 8.9 - 10.3 mg/dL 9.1  9.1  9.3   Total Protein 6.5 - 8.1 g/dL 6.3  6.3  6.6   Total Bilirubin 0.3 - 1.2 mg/dL 1.2  1.4   1.2   Alkaline Phos 38 - 126 U/L 38  42  43   AST 15 - 41 U/L 29  37  39   ALT 0 - 44 U/L 13  14  15

## 2022-09-12 NOTE — Patient Instructions (Signed)
[facility]  Discharge Instructions: Thank you for choosing [facility] to provide your oncology and hematology care.  If you have a lab appointment with the Cancer Center, please go directly to the Cancer Center and check in at the registration area.  Wear comfortable clothing and clothing appropriate for easy access to any Portacath or PICC line.   We strive to give you quality time with your provider. You may need to reschedule your appointment if you arrive late (15 or more minutes).  Arriving late affects you and other patients whose appointments are after yours.  Also, if you miss three or more appointments without notifying the office, you may be dismissed from the clinic at the provider's discretion.      For prescription refill requests, have your pharmacy contact our office and allow 72 hours for refills to be completed.    Today you received the following chemotherapy and/or immunotherapy agents Vegzelma, Leucovorin, Adrucil      To help prevent nausea and vomiting after your treatment, we encourage you to take your nausea medication as directed.  BELOW ARE SYMPTOMS THAT SHOULD BE REPORTED IMMEDIATELY: *FEVER GREATER THAN 100.4 F (38 C) OR HIGHER *CHILLS OR SWEATING *NAUSEA AND VOMITING THAT IS NOT CONTROLLED WITH YOUR NAUSEA MEDICATION *UNUSUAL SHORTNESS OF BREATH *UNUSUAL BRUISING OR BLEEDING *URINARY PROBLEMS (pain or burning when urinating, or frequent urination) *BOWEL PROBLEMS (unusual diarrhea, constipation, pain near the anus) TENDERNESS IN MOUTH AND THROAT WITH OR WITHOUT PRESENCE OF ULCERS (sore throat, sores in mouth, or a toothache) UNUSUAL RASH, SWELLING OR PAIN  UNUSUAL VAGINAL DISCHARGE OR ITCHING   Items with * indicate a potential emergency and should be followed up as soon as possible or go to the Emergency Department if any problems should occur.  Please show the CHEMOTHERAPY ALERT CARD or IMMUNOTHERAPY ALERT  CARD at check-in to the Emergency Department and triage nurse.  Should you have questions after your visit or need to cancel or reschedule your appointment, please contact [facility]  [phone] and follow the prompts.  Office hours are 8:00 a.m. to 4:30 p.m. Monday - Friday. Please note that voicemails left after 4:00 p.m. may not be returned until the following business day.  We are closed weekends and major holidays. You have access to a nurse at all times for urgent questions. Please call the main number to the clinic [phone] and follow the prompts.  For any non-urgent questions, you may also contact your provider using MyChart. We now offer e-Visits for anyone 18 and older to request care online for non-urgent symptoms. For details visit [email or web address].   Also download the MyChart app! Go to the app store, search "MyChart", open the app, select , and log in with your MyChart username and password.    

## 2022-09-12 NOTE — Assessment & Plan Note (Signed)
Mild anemia. continue to monitor.  Hemoglobin is close to baseline. 

## 2022-09-12 NOTE — Assessment & Plan Note (Signed)
Overall he tolerates chemotherapy.   CEA is relatively stable with small fluctuations. CT images were reviewed and findings were discussed.  Small lung nodules, not able to further characterize due to size.  Will repeat CT in 3 months Labs reviewed and discussed with patient Proceed with 5-FU/bevacizumab treatment today Continue current regimen.  

## 2022-09-12 NOTE — Assessment & Plan Note (Signed)
Chemotherapy plan as listed above 

## 2022-09-14 ENCOUNTER — Inpatient Hospital Stay: Payer: Medicare Other | Attending: Oncology

## 2022-09-14 VITALS — BP 150/82 | HR 60 | Resp 18

## 2022-09-14 DIAGNOSIS — Z452 Encounter for adjustment and management of vascular access device: Secondary | ICD-10-CM | POA: Insufficient documentation

## 2022-09-14 DIAGNOSIS — Z7982 Long term (current) use of aspirin: Secondary | ICD-10-CM | POA: Diagnosis not present

## 2022-09-14 DIAGNOSIS — D6481 Anemia due to antineoplastic chemotherapy: Secondary | ICD-10-CM | POA: Insufficient documentation

## 2022-09-14 DIAGNOSIS — Z85038 Personal history of other malignant neoplasm of large intestine: Secondary | ICD-10-CM | POA: Diagnosis not present

## 2022-09-14 DIAGNOSIS — Z86010 Personal history of colonic polyps: Secondary | ICD-10-CM | POA: Insufficient documentation

## 2022-09-14 DIAGNOSIS — T451X5D Adverse effect of antineoplastic and immunosuppressive drugs, subsequent encounter: Secondary | ICD-10-CM | POA: Diagnosis not present

## 2022-09-14 DIAGNOSIS — Z5111 Encounter for antineoplastic chemotherapy: Secondary | ICD-10-CM | POA: Diagnosis present

## 2022-09-14 DIAGNOSIS — Z836 Family history of other diseases of the respiratory system: Secondary | ICD-10-CM | POA: Insufficient documentation

## 2022-09-14 DIAGNOSIS — Z5112 Encounter for antineoplastic immunotherapy: Secondary | ICD-10-CM | POA: Insufficient documentation

## 2022-09-14 DIAGNOSIS — C786 Secondary malignant neoplasm of retroperitoneum and peritoneum: Secondary | ICD-10-CM | POA: Insufficient documentation

## 2022-09-14 DIAGNOSIS — C184 Malignant neoplasm of transverse colon: Secondary | ICD-10-CM | POA: Diagnosis present

## 2022-09-14 DIAGNOSIS — Z809 Family history of malignant neoplasm, unspecified: Secondary | ICD-10-CM | POA: Diagnosis not present

## 2022-09-14 DIAGNOSIS — Z803 Family history of malignant neoplasm of breast: Secondary | ICD-10-CM | POA: Insufficient documentation

## 2022-09-14 DIAGNOSIS — K648 Other hemorrhoids: Secondary | ICD-10-CM | POA: Insufficient documentation

## 2022-09-14 DIAGNOSIS — Z79899 Other long term (current) drug therapy: Secondary | ICD-10-CM | POA: Diagnosis not present

## 2022-09-14 LAB — CEA: CEA: 18.6 ng/mL — ABNORMAL HIGH (ref 0.0–4.7)

## 2022-09-14 MED ORDER — SODIUM CHLORIDE 0.9% FLUSH
10.0000 mL | INTRAVENOUS | Status: DC | PRN
  Administered 2022-09-14: 10 mL
  Filled 2022-09-14: qty 10

## 2022-09-14 MED ORDER — HEPARIN SOD (PORK) LOCK FLUSH 100 UNIT/ML IV SOLN
500.0000 [IU] | Freq: Once | INTRAVENOUS | Status: AC | PRN
  Administered 2022-09-14: 500 [IU]
  Filled 2022-09-14: qty 5

## 2022-09-25 MED FILL — Dexamethasone Sodium Phosphate Inj 100 MG/10ML: INTRAMUSCULAR | Qty: 1 | Status: AC

## 2022-09-26 ENCOUNTER — Encounter: Payer: Self-pay | Admitting: Oncology

## 2022-09-26 ENCOUNTER — Inpatient Hospital Stay: Payer: Medicare Other

## 2022-09-26 ENCOUNTER — Inpatient Hospital Stay (HOSPITAL_BASED_OUTPATIENT_CLINIC_OR_DEPARTMENT_OTHER): Payer: Medicare Other | Admitting: Oncology

## 2022-09-26 VITALS — BP 118/68 | HR 69 | Temp 98.2°F | Resp 18 | Wt 224.3 lb

## 2022-09-26 DIAGNOSIS — Z85038 Personal history of other malignant neoplasm of large intestine: Secondary | ICD-10-CM | POA: Diagnosis not present

## 2022-09-26 DIAGNOSIS — C786 Secondary malignant neoplasm of retroperitoneum and peritoneum: Secondary | ICD-10-CM

## 2022-09-26 DIAGNOSIS — Z5111 Encounter for antineoplastic chemotherapy: Secondary | ICD-10-CM | POA: Diagnosis not present

## 2022-09-26 DIAGNOSIS — T451X5A Adverse effect of antineoplastic and immunosuppressive drugs, initial encounter: Secondary | ICD-10-CM

## 2022-09-26 DIAGNOSIS — C184 Malignant neoplasm of transverse colon: Secondary | ICD-10-CM

## 2022-09-26 DIAGNOSIS — D6481 Anemia due to antineoplastic chemotherapy: Secondary | ICD-10-CM

## 2022-09-26 DIAGNOSIS — Z5112 Encounter for antineoplastic immunotherapy: Secondary | ICD-10-CM | POA: Diagnosis not present

## 2022-09-26 LAB — CBC WITH DIFFERENTIAL/PLATELET
Abs Immature Granulocytes: 0.02 10*3/uL (ref 0.00–0.07)
Basophils Absolute: 0.1 10*3/uL (ref 0.0–0.1)
Basophils Relative: 1 %
Eosinophils Absolute: 0.1 10*3/uL (ref 0.0–0.5)
Eosinophils Relative: 2 %
HCT: 34.7 % — ABNORMAL LOW (ref 39.0–52.0)
Hemoglobin: 11.2 g/dL — ABNORMAL LOW (ref 13.0–17.0)
Immature Granulocytes: 1 %
Lymphocytes Relative: 39 %
Lymphs Abs: 1.7 10*3/uL (ref 0.7–4.0)
MCH: 30 pg (ref 26.0–34.0)
MCHC: 32.3 g/dL (ref 30.0–36.0)
MCV: 93 fL (ref 80.0–100.0)
Monocytes Absolute: 0.4 10*3/uL (ref 0.1–1.0)
Monocytes Relative: 8 %
Neutro Abs: 2.1 10*3/uL (ref 1.7–7.7)
Neutrophils Relative %: 49 %
Platelets: 158 10*3/uL (ref 150–400)
RBC: 3.73 MIL/uL — ABNORMAL LOW (ref 4.22–5.81)
RDW: 15 % (ref 11.5–15.5)
WBC: 4.3 10*3/uL (ref 4.0–10.5)
nRBC: 0 % (ref 0.0–0.2)

## 2022-09-26 LAB — COMPREHENSIVE METABOLIC PANEL
ALT: 11 U/L (ref 0–44)
AST: 37 U/L (ref 15–41)
Albumin: 3.5 g/dL (ref 3.5–5.0)
Alkaline Phosphatase: 41 U/L (ref 38–126)
Anion gap: 9 (ref 5–15)
BUN: 14 mg/dL (ref 8–23)
CO2: 24 mmol/L (ref 22–32)
Calcium: 9.1 mg/dL (ref 8.9–10.3)
Chloride: 103 mmol/L (ref 98–111)
Creatinine, Ser: 0.75 mg/dL (ref 0.61–1.24)
GFR, Estimated: >60 mL/min (ref >60)
Glucose, Bld: 119 mg/dL — ABNORMAL HIGH (ref 70–99)
Potassium: 3.5 mmol/L (ref 3.5–5.1)
Sodium: 136 mmol/L (ref 135–145)
Total Bilirubin: 1.5 mg/dL — ABNORMAL HIGH (ref 0.3–1.2)
Total Protein: 6 g/dL — ABNORMAL LOW (ref 6.5–8.1)

## 2022-09-26 LAB — PROTEIN, URINE, RANDOM: Total Protein, Urine: 47 mg/dL

## 2022-09-26 MED ORDER — SODIUM CHLORIDE 0.9 % IV SOLN
Freq: Once | INTRAVENOUS | Status: AC
  Filled 2022-09-26: qty 250

## 2022-09-26 MED ORDER — SODIUM CHLORIDE 0.9 % IV SOLN
2400.0000 mg/m2 | INTRAVENOUS | Status: DC
  Administered 2022-09-26: 5000 mg via INTRAVENOUS
  Filled 2022-09-26: qty 100

## 2022-09-26 MED ORDER — SODIUM CHLORIDE 0.9 % IV SOLN
10.0000 mg | Freq: Once | INTRAVENOUS | Status: AC
  Administered 2022-09-26: 10 mg via INTRAVENOUS
  Filled 2022-09-26: qty 10

## 2022-09-26 MED ORDER — SODIUM CHLORIDE 0.9 % IV SOLN
400.0000 mg/m2 | Freq: Once | INTRAVENOUS | Status: DC
  Filled 2022-09-26: qty 45.8

## 2022-09-26 MED ORDER — SODIUM CHLORIDE 0.9 % IV SOLN
5.0000 mg/kg | Freq: Once | INTRAVENOUS | Status: AC
  Administered 2022-09-26: 500 mg via INTRAVENOUS
  Filled 2022-09-26: qty 20

## 2022-09-26 MED ORDER — FLUOROURACIL CHEMO INJECTION 2.5 GM/50ML
400.0000 mg/m2 | Freq: Once | INTRAVENOUS | Status: AC
  Administered 2022-09-26: 900 mg via INTRAVENOUS
  Filled 2022-09-26: qty 18

## 2022-09-26 MED ORDER — SODIUM CHLORIDE 0.9 % IV SOLN
400.0000 mg/m2 | Freq: Once | INTRAVENOUS | Status: AC
  Administered 2022-09-26: 916 mg via INTRAVENOUS
  Filled 2022-09-26: qty 45.8

## 2022-09-26 NOTE — Progress Notes (Signed)
Hematology/Oncology Progress note [phone] Fax:([phone]    Chief Complaint: Jared Gutierrez is a 82 y.o. male presents for follow-up of metastatic colon cancer   ASSESSMENT & PLAN:   Cancer Staging  Cancer of transverse colon [facility]) Staging form: Colon and Rectum, [name], [name], [name], MD on 10/26/2021   Cancer of transverse colon (HCC) Overall he tolerates chemotherapy.   CEA is relatively stable with small fluctuations. CT images were reviewed and findings were discussed.  Small lung nodules, not able to further characterize due to size.  Labs reviewed and discussed with patient Proceed with 5-FU/bevacizumab treatment today Continue current regimen. Repeat CT  Encounter for antineoplastic chemotherapy Chemotherapy plan as listed above.   Anemia due to chemotherapy Mild anemia. continue to monitor.  Hemoglobin is close to baseline.   Follow-up 2 weeks lab MD 5-FU/bevacizumab -   All questions were answered. The patient knows to call the clinic with any problems, questions or concerns.  [name], MD, PhD [facility] Hematology Oncology 09/26/2022     PERTINENT ONCOLOGY HISTORY Jared Gutierrez is a 82 y.o.[place] who has above oncology history reviewed by me today presented for follow up visit for management of Metastatic colon cancer. [name], patient switched care to me on 11/11/20 Extensive medical record review was performed by me  Oncology History  History of colon cancer, stage I  10/02/2014 Pathology Results   [id number] colonoscopy pathology A. CECAL POLYPS; HOT SNARE AND COLD BIOPSY:  - TUBULAR ADENOMA, MULTIPLE FRAGMENTS.  - NEGATIVE FOR HIGH-GRADE DYSPLASIA AND MALIGNANCY.   B. COLON POLYP, TRANSVERSE; COLD BIOPSY:  - TUBULAR ADENOMA.  - NEGATIVE FOR HIGH-GRADE DYSPLASIA AND MALIGNANCY.     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  Tumor extended into the submucosa. Margins were negative. + lymphovascular invasion. 14 lymph nodes were negative. Pathologic stage was T1 N0.  TRANSVERSE COLON, RESECTION:  - INVASIVE ADENOCARCINOMA ARISING IN A 4.6 CM TUBULOVILLOUS  ADENOMA WITH HIGH GRADE DYSPLASIA (MALIGNANT POLYP).  - SEE SUMMARY BELOW.  [name]  ONCOLOGY SUMMARY: COLON AND RECTUM, RESECTION, AJCC 7TH EDITION  Specimen: transverse colon  Procedure: transverse colon resection  Tumor site: splenic flexure  Tumor size: 1.0 cm (invasive component)  Macroscopic tumor perforation: not specified  Histologic type: invasive adenocarcinoma  Histologic grade: moderately differentiated  Microscopic tumor extension: into submucosa  Margins:       Proximal margin: negative       Distal margin: negative       Circumferential (radial) or mesenteric margin: negative       If all margins uninvolved by invasive carcinoma:  Distance of invasive carcinoma from closest margin: 7.5 cm  to distal margin  Treatment effect: not applicable  Lymph-vascular invasion: present  Perineural invasion: not identified  Tumor deposits (discontinuous extramural extension): not  identified   Pathologic staging:       Primary tumor: pT1       Regional lymph nodes: pN0            Number of nodes examined: 14            Number of nodes involved: 0    04/02/2019 Progression   04/02/2019  PET scan  limited evealed a 2.4 x 2.3 cm (SUV 11) hypermetabolic soft tissue density caudal and anterior to the pancreatic neck favored to represent isolated peritoneal or nodal metastasis in the setting of prior transverse colonic resection (expected primary drainage). Although this was immediately adjacent to the pancreas, a fat plane was maintained, arguing strongly against a pancreatic primary. Otherwise, there was no evidence of hypermetabolic metastasis. 04/17/2019 EUS on  revealed a normal esophagus, stomach, duodenum, and pancreas.  There was a 2.4 x 2.4 cm irregular mass in the retroperitoneum adjacent to, but not involving the pancreatic neck.  FNA and core needle biopsy were performed.  Pathology revealed adenocarcinoma compatible with a metastatic lesion of colorectal origin.  Tumor cells were positive for CK20 and CDX2 and negative for CK7.  NGS: [employer] on 05/15/2019 revealed + [id number] and TP53.  Negative results included BRAF V600E, Her2, NRAS, NTRK, PD-L1 (<1%), and TMB 8.7/Mb (intermediate).  MMR testing from his colon resection on 10/16/2013 was intact with a low probability of MSI-H.   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  He received Neulasta after cycle #4 and #5 secondary to progressive leukopenia.  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  He received a truncated course of FOLFOX with cycle #11 secondary to oxaliplatin reaction.   01/14/2020 Imaging    PET revealed an interval decrease in size and FDG uptake (2.4 x 2.3 cm with SUV 11 to 2.4 x 1.7 cm with SUV 4.09) associated with the previously referenced soft tissue  density caudal and anterior to the pancreatic neck suggesting treatment response. There were no new sites of FDG avid tumor.   08/31/2020 Imaging   08/31/2020 Abdomen and pelvis CT revealed increased size of the index soft tissue lesion inferior and anterior to the pancreatic neck (2.9 x 1.8 cm to 3.5 x 2.9 cm). There were similar prominent retroperitoneal lymph nodes without adenopathy by size criteria. There was no new or enlarging abdominal or pelvic lymph nodes. There were no new interval findings. There was similar circumferential wall thickening of a nondistended urinary bladder, which likely accentuated wall thickening There was hepatic steatosis and aortic atherosclerosis.   11/03/2020, PET showed recurrent peritoneal metastasis in the upper abdomen adjacent to the pancreas.  The lesion is 3.8 x 2.9 cm with SUV of 11.3. No evidence of metastatic peritoneal disease elsewhere in the abdomen pelvis.  No evidence of distant metastasis.   11/03/2020 Imaging   PET scan showed recurrent peritoneal metastasis near pancreas. Given that he has no other distant metastasis. Discussed with radiation oncology. Repeat SBRT may be considered if he does not respond well to systemic chemotherapy.     11/26/2020 -  Chemotherapy   5-FU and bevacizumab.   02/17/2021 Imaging  02/17/2021 CT abdomen pelvis showed partial response. Mild decrease of peritoneal lesion.  Proceed with 5-FU and bevacizumab today.  Given that he is tolerating current regimen with good life quality, partial response.  Shared decision was made to hold off adding additional chemotherapy agents.   06/08/2021, CT chest abdomen pelvis with contrast showed soft tissue mass at the base of mesentery inferior to the pancreatic neck is unchanged in size.  No progressive disease was identified.   09/07/2021, CT chest abdomen pelvis with contrast showed no substantial changes in size of the mesenteric mass, 2.6 cm.  No suspicious new lesions.  Chronic  findings as detailed in the imaging report.   12/05/2021 Imaging   PET scan showed soft tissue mesenteric mass is unchanged in size.  Decreased FDG uptake compared to activity on November 03, 2020.  Fever treated disease.  No new metastatic disease.   03/21/2022 Imaging   CT chest abdomen pelvis  1. Similar size of the peripancreatic presumed peritoneal implant compared to 09/07/2021. 2. No new or progressive disease. 3. Incidental findings, including: Coronary artery atherosclerosis.Aortic Atherosclerosis (ICD10-I70.0). Prostatomegaly with chronic bladder wall thickening, suggesting outlet obstruction.     07/11/2022 Imaging   CT chest abdomen pelvis with contrast showed 1. New 6 mm nodule in the right upper lobe and 2 mm nodule in the left upper lobe. Although small these are concerning. Recommend short follow-up in 3 months 2. Stable low-attenuation lesion along the midbody of the pancreas. Continued attention on follow-up. No new peritoneal mass lesions or ascites. 3. Fatty liver infiltration 4. Colonic surgical changes      Other medical problems Chronic lower extremity edema. 12/24/2018, right lower extremity duplex negative for DVT.  Small right Baker's cyst. 08/16/2019, bilateral lower extremity duplex showed no DVT. 08/16/2019 - 08/17/2019 with right lower extremity cellulitis.  He was unable to bear weight.  He was treated with IV fluids, NSAIDs, colchicine, and broad antibiotics (vancomycin and Cefepime).  He was discharged on indomethacin x 5 days and Keflex 500 mg TID x 5 days.  10/05/2020, colonoscopy showed internal hemorrhoids.  Otherwise normal examination.  INTERVAL HISTORY AQUILA MAHI is a 82 y.o. male who has above history reviewed by me today presents for follow up visit for management of recurrent metastatic colon cancer Patient was accompanied by wife.   Denies fever, chills, nausea, vomiting, diarrhea, chest pain, shortness of breath, abdominal pain,    Review of  Systems  Constitutional:  Negative for appetite change, chills, diaphoresis, fever and unexpected weight change.  HENT:   Negative for hearing loss, nosebleeds, sore throat and tinnitus.   Respiratory:  Negative for cough, hemoptysis and shortness of breath.   Cardiovascular:  Negative for chest pain and palpitations.  Gastrointestinal:  Negative for abdominal pain, blood in stool, constipation, diarrhea, nausea and vomiting.  Genitourinary:  Negative for dysuria, frequency and hematuria.   Musculoskeletal:  Positive for arthralgias. Negative for back pain, myalgias and neck pain.  Skin:  Negative for itching and rash.  Neurological:  Positive for numbness. Negative for dizziness and headaches.  Hematological:  Does not bruise/bleed easily.  Psychiatric/Behavioral:  Negative for depression. The patient is not nervous/anxious.       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   Chicken pox    Colon cancer (HCC)    History of kidney stones  Hypertension    Lipoma of colon    Nephrolithiasis    Nephrolithiasis    Obesity    Shingles    Tubular adenoma of colon    multiple fragments    Past Surgical History:  Procedure Laterality Date   COLON SURGERY     COLONOSCOPY N/A 10/02/2014   Procedure: COLONOSCOPY;  Surgeon: Elnita Maxwell, MD;  Location: [facility] ENDOSCOPY;  Service: Endoscopy;  Laterality: N/A;   COLONOSCOPY WITH PROPOFOL N/A 02/16/2017   Procedure: COLONOSCOPY WITH PROPOFOL;  Surgeon: Scot Jun, MD;  Location: [facility] ENDOSCOPY;  Service: Endoscopy;  Laterality: N/A;   COLONOSCOPY WITH PROPOFOL N/A 10/05/2020   Procedure: COLONOSCOPY WITH PROPOFOL;  Surgeon: Regis Bill, MD;  Location: [facility] ENDOSCOPY;  Service: Endoscopy;  Laterality: N/A;   EUS N/A 04/17/2019   Procedure: FULL UPPER ENDOSCOPIC ULTRASOUND (EUS) RADIAL;  Surgeon: Bearl Mulberry, MD;  Location:  [facility] ENDOSCOPY;  Service: Gastroenterology;  Laterality: N/A;   KIDNEY STONE SURGERY     PARTIAL COLECTOMY  10/17/2013   PORTACATH PLACEMENT Right 06/13/2019   Procedure: INSERTION PORT-A-CATH;  Surgeon: Sung Amabile, DO;  Location: [facility] ORS;  Service: General;  Laterality: Right;    Family History  Problem Relation Age of Onset   Cancer Mother    Breast cancer Mother    COPD Father     Social History:  reports that he has never smoked. He has never used smokeless tobacco. He reports current alcohol use. He reports that he does not use drugs.    Allergies: No Known Allergies  Current Medications: Current Outpatient Medications  Medication Sig Dispense Refill   acetaminophen (TYLENOL) 500 MG tablet Take 500 mg by mouth every 6 (six) hours as needed.     amLODipine (NORVASC) 2.5 MG tablet Take 2.5 mg by mouth daily.     aspirin EC 81 MG tablet Take 81 mg by mouth daily.     Calcium Carbonate (CALCIUM 600 PO) Take 1 tablet by mouth 2 (two) times daily.     Cholecalciferol (VITAMIN D) 125 MCG (5000 UT) CAPS Take 5,000 mg by mouth.     docusate sodium (COLACE) 100 MG capsule Take 1 capsule (100 mg total) by mouth 2 (two) times daily as needed for mild constipation or moderate constipation. 60 capsule 3   HYDROcodone-acetaminophen (NORCO/VICODIN) 5-325 MG tablet Take 1 tablet by mouth every 6 (six) hours as needed for moderate pain (up to 3 doses for moderate pain.).     lidocaine-prilocaine (EMLA) cream APPLY TO AFFECTED AREA ONCE AS DIRECTED 30 g 3   loperamide (IMODIUM) 2 MG capsule Take 1 capsule (2 mg total) by mouth See admin instructions. Take 2 tablets after first loose stool,  then 1 tablet  after each loose stool; maximum: 8 tablets /day 60 capsule 0   loratadine (CLARITIN) 10 MG tablet Take 10 mg by mouth daily.     olmesartan (BENICAR) 20 MG tablet Take 1 tablet by mouth daily.     ondansetron (ZOFRAN) 8 MG tablet Take 1 tablet (8 mg total) by mouth 2 (two) times daily as needed  for refractory nausea / vomiting. Start on day 3 after chemotherapy. 60 tablet 1   potassium chloride SA (KLOR-CON M20) 20 MEQ tablet Take 1 tablet (20 mEq total) by mouth daily. 180 tablet 1   Probiotic Product (PROBIOTIC DAILY PO) Take 1 tablet by mouth daily.     prochlorperazine (COMPAZINE) 10 MG tablet Take 1 tablet (10 mg total) by mouth  every 6 (six) hours as needed (NAUSEA). 30 tablet 1   senna-docusate (SENNA S) 8.6-50 MG tablet Take 2 tablets by mouth daily. 60 tablet 3   No current facility-administered medications for this visit.   Facility-Administered Medications Ordered in Other Visits  Medication Dose Route Frequency Provider Last Rate Last Admin   0.9 %  sodium chloride infusion   Intravenous Once Corcoran, Melissa C, MD       0.9 %  sodium chloride infusion   Intravenous Continuous Rosey Bath, MD 10 mL/hr at 12/31/19 1000 New Bag at 10/05/20 1045   fluorouracil (ADRUCIL) 5,000 mg in sodium chloride 0.9 % 150 mL chemo infusion  2,400 mg/m2 (Treatment Plan Recorded) Intravenous 1 day or 1 dose [name], MD       fluorouracil (ADRUCIL) chemo injection 900 mg  400 mg/m2 (Treatment Plan Recorded) Intravenous Once [name], MD       heparin lock flush 100 unit/mL  500 Units Intravenous Once Rosey Bath, MD        Performance status (ECOG): 1  Vitals Blood pressure 118/68, pulse 69, temperature 98.2 F (36.8 C), resp. rate 18, weight 224 lb 4.8 oz (101.7 kg).   Physical Exam Constitutional:      General: He is not in acute distress.    Appearance: He is obese. He is not diaphoretic.  HENT:     Head: Normocephalic and atraumatic.  Eyes:     General: No scleral icterus.    Pupils: Pupils are equal, round, and reactive to light.  Cardiovascular:     Rate and Rhythm: Normal rate and regular rhythm.     Heart sounds: No murmur heard. Pulmonary:     Effort: Pulmonary effort is normal. No respiratory distress.     Breath sounds: No wheezing.     Comments:  Decreased breath sound bilaterally.  Abdominal:     General: There is no distension.     Palpations: Abdomen is soft.     Tenderness: There is no abdominal tenderness.  Musculoskeletal:        General: Normal range of motion.     Cervical back: Normal range of motion and neck supple.  Skin:    General: Skin is warm and dry.     Findings: No erythema.  Neurological:     Mental Status: He is alert and oriented to person, place, and time. Mental status is at baseline.     Cranial Nerves: No cranial nerve deficit.     Motor: No abnormal muscle tone.  Psychiatric:        Mood and Affect: Mood and affect normal.     Labs were reviewed by me.     Latest Ref Rng & Units 09/26/2022    8:07 AM 09/12/2022    8:34 AM 08/29/2022    8:53 AM  CBC  WBC 4.0 - 10.5 K/uL 4.3  3.4  4.4   Hemoglobin 13.0 - 17.0 g/dL 82.9  56.2  13.0   Hematocrit 39.0 - 52.0 % 34.7  35.2  34.9   Platelets 150 - 400 K/uL 158  181  174       Latest Ref Rng & Units 09/26/2022    8:07 AM 09/12/2022    8:34 AM 08/29/2022    8:53 AM  CMP  Glucose 70 - 99 mg/dL 865  784  696   BUN 8 - 23 mg/dL 14  7  12    Creatinine 0.61 - 1.24 mg/dL 2.95  0.79  0.72   Sodium 135 - 145 mmol/L 136  134  135   Potassium 3.5 - 5.1 mmol/L 3.5  3.5  3.7   Chloride 98 - 111 mmol/L 103  103  105   CO2 22 - 32 mmol/L 24  21  24    Calcium 8.9 - 10.3 mg/dL 9.1  8.7  9.1   Total Protein 6.5 - 8.1 g/dL 6.0  6.1  6.3   Total Bilirubin 0.3 - 1.2 mg/dL 1.5  1.5  1.2   Alkaline Phos 38 - 126 U/L 41  42  38   AST 15 - 41 U/L 37  44  29   ALT 0 - 44 U/L 11  14  13

## 2022-09-26 NOTE — Assessment & Plan Note (Signed)
Chemotherapy plan as listed above 

## 2022-09-26 NOTE — Assessment & Plan Note (Addendum)
Overall he tolerates chemotherapy.   CEA is relatively stable with small fluctuations. CT images were reviewed and findings were discussed.  Small lung nodules, not able to further characterize due to size.  Labs reviewed and discussed with patient Proceed with 5-FU/bevacizumab treatment today Continue current regimen. Repeat CT

## 2022-09-26 NOTE — Assessment & Plan Note (Signed)
Mild anemia. continue to monitor.  Hemoglobin is close to baseline. 

## 2022-09-28 ENCOUNTER — Inpatient Hospital Stay: Payer: Medicare Other

## 2022-09-28 VITALS — BP 136/90 | HR 61 | Resp 18

## 2022-09-28 DIAGNOSIS — C786 Secondary malignant neoplasm of retroperitoneum and peritoneum: Secondary | ICD-10-CM

## 2022-09-28 DIAGNOSIS — Z5112 Encounter for antineoplastic immunotherapy: Secondary | ICD-10-CM | POA: Diagnosis not present

## 2022-09-28 DIAGNOSIS — Z85038 Personal history of other malignant neoplasm of large intestine: Secondary | ICD-10-CM

## 2022-09-28 MED ORDER — SODIUM CHLORIDE 0.9% FLUSH
10.0000 mL | INTRAVENOUS | Status: DC | PRN
  Administered 2022-09-28: 10 mL
  Filled 2022-09-28: qty 10

## 2022-09-28 MED ORDER — HEPARIN SOD (PORK) LOCK FLUSH 100 UNIT/ML IV SOLN
500.0000 [IU] | Freq: Once | INTRAVENOUS | Status: AC | PRN
  Administered 2022-09-28: 500 [IU]
  Filled 2022-09-28: qty 5

## 2022-10-02 ENCOUNTER — Ambulatory Visit
Admission: RE | Admit: 2022-10-02 | Discharge: 2022-10-02 | Disposition: A | Discharge status: Home / Self Care | Payer: Medicare Other | Source: Ambulatory Visit | Attending: Oncology | Admitting: Oncology

## 2022-10-02 DIAGNOSIS — C184 Malignant neoplasm of transverse colon: Secondary | ICD-10-CM | POA: Diagnosis present

## 2022-10-02 MED ORDER — IOHEXOL 300 MG/ML  SOLN
100.0000 mL | Freq: Once | INTRAMUSCULAR | Status: AC | PRN
  Administered 2022-10-02: 100 mL via INTRAVENOUS

## 2022-10-06 MED FILL — Dexamethasone Sodium Phosphate Inj 100 MG/10ML: INTRAMUSCULAR | Qty: 1 | Status: AC

## 2022-10-10 ENCOUNTER — Inpatient Hospital Stay (HOSPITAL_BASED_OUTPATIENT_CLINIC_OR_DEPARTMENT_OTHER): Payer: Medicare Other | Admitting: Oncology

## 2022-10-10 ENCOUNTER — Inpatient Hospital Stay: Payer: Medicare Other

## 2022-10-10 ENCOUNTER — Encounter: Payer: Self-pay | Admitting: Oncology

## 2022-10-10 VITALS — BP 122/73 | HR 82 | Temp 97.0°F | Resp 18 | Wt 225.4 lb

## 2022-10-10 DIAGNOSIS — C184 Malignant neoplasm of transverse colon: Secondary | ICD-10-CM | POA: Diagnosis not present

## 2022-10-10 DIAGNOSIS — D6481 Anemia due to antineoplastic chemotherapy: Secondary | ICD-10-CM

## 2022-10-10 DIAGNOSIS — Z85038 Personal history of other malignant neoplasm of large intestine: Secondary | ICD-10-CM

## 2022-10-10 DIAGNOSIS — Z5111 Encounter for antineoplastic chemotherapy: Secondary | ICD-10-CM

## 2022-10-10 DIAGNOSIS — C786 Secondary malignant neoplasm of retroperitoneum and peritoneum: Secondary | ICD-10-CM | POA: Diagnosis not present

## 2022-10-10 DIAGNOSIS — T451X5A Adverse effect of antineoplastic and immunosuppressive drugs, initial encounter: Secondary | ICD-10-CM

## 2022-10-10 DIAGNOSIS — Z5112 Encounter for antineoplastic immunotherapy: Secondary | ICD-10-CM | POA: Diagnosis not present

## 2022-10-10 LAB — COMPREHENSIVE METABOLIC PANEL
ALT: 12 U/L (ref 0–44)
AST: 36 U/L (ref 15–41)
Albumin: 3.3 g/dL — ABNORMAL LOW (ref 3.5–5.0)
Alkaline Phosphatase: 42 U/L (ref 38–126)
Anion gap: 10 (ref 5–15)
BUN: 10 mg/dL (ref 8–23)
CO2: 22 mmol/L (ref 22–32)
Calcium: 9 mg/dL (ref 8.9–10.3)
Chloride: 100 mmol/L (ref 98–111)
Creatinine, Ser: 0.69 mg/dL (ref 0.61–1.24)
GFR, Estimated: >60 mL/min (ref >60)
Glucose, Bld: 120 mg/dL — ABNORMAL HIGH (ref 70–99)
Potassium: 3.5 mmol/L (ref 3.5–5.1)
Sodium: 132 mmol/L — ABNORMAL LOW (ref 135–145)
Total Bilirubin: 0.9 mg/dL (ref 0.3–1.2)
Total Protein: 6.2 g/dL — ABNORMAL LOW (ref 6.5–8.1)

## 2022-10-10 LAB — CBC WITH DIFFERENTIAL/PLATELET
Abs Immature Granulocytes: 0.01 10*3/uL (ref 0.00–0.07)
Basophils Absolute: 0 10*3/uL (ref 0.0–0.1)
Basophils Relative: 1 %
Eosinophils Absolute: 0 10*3/uL (ref 0.0–0.5)
Eosinophils Relative: 1 %
HCT: 34.1 % — ABNORMAL LOW (ref 39.0–52.0)
Hemoglobin: 11.1 g/dL — ABNORMAL LOW (ref 13.0–17.0)
Immature Granulocytes: 0 %
Lymphocytes Relative: 35 %
Lymphs Abs: 1.6 10*3/uL (ref 0.7–4.0)
MCH: 30.1 pg (ref 26.0–34.0)
MCHC: 32.6 g/dL (ref 30.0–36.0)
MCV: 92.4 fL (ref 80.0–100.0)
Monocytes Absolute: 0.4 10*3/uL (ref 0.1–1.0)
Monocytes Relative: 9 %
Neutro Abs: 2.4 10*3/uL (ref 1.7–7.7)
Neutrophils Relative %: 54 %
Platelets: 172 10*3/uL (ref 150–400)
RBC: 3.69 MIL/uL — ABNORMAL LOW (ref 4.22–5.81)
RDW: 15.2 % (ref 11.5–15.5)
WBC: 4.5 10*3/uL (ref 4.0–10.5)
nRBC: 0 % (ref 0.0–0.2)

## 2022-10-10 LAB — PROTEIN, URINE, RANDOM: Total Protein, Urine: 56 mg/dL

## 2022-10-10 MED ORDER — SODIUM CHLORIDE 0.9 % IV SOLN
5.0000 mg/kg | Freq: Once | INTRAVENOUS | Status: AC
  Administered 2022-10-10: 500 mg via INTRAVENOUS
  Filled 2022-10-10: qty 20

## 2022-10-10 MED ORDER — SODIUM CHLORIDE 0.9 % IV SOLN
2400.0000 mg/m2 | INTRAVENOUS | Status: DC
  Administered 2022-10-10: 5000 mg via INTRAVENOUS
  Filled 2022-10-10: qty 100

## 2022-10-10 MED ORDER — SODIUM CHLORIDE 0.9 % IV SOLN
Freq: Once | INTRAVENOUS | Status: AC
  Filled 2022-10-10: qty 250

## 2022-10-10 MED ORDER — SODIUM CHLORIDE 0.9 % IV SOLN
10.0000 mg | Freq: Once | INTRAVENOUS | Status: AC
  Administered 2022-10-10: 10 mg via INTRAVENOUS
  Filled 2022-10-10: qty 10

## 2022-10-10 MED ORDER — SODIUM CHLORIDE 0.9 % IV SOLN
400.0000 mg/m2 | Freq: Once | INTRAVENOUS | Status: AC
  Administered 2022-10-10: 884 mg via INTRAVENOUS
  Filled 2022-10-10: qty 44.2

## 2022-10-10 MED ORDER — FLUOROURACIL CHEMO INJECTION 2.5 GM/50ML
400.0000 mg/m2 | Freq: Once | INTRAVENOUS | Status: AC
  Administered 2022-10-10: 900 mg via INTRAVENOUS
  Filled 2022-10-10: qty 18

## 2022-10-10 NOTE — Progress Notes (Signed)
Hematology/Oncology Progress note [phone] [phone]    Chief Complaint: Jared Gutierrez is a 82 y.o. male presents for follow-up of metastatic colon cancer   ASSESSMENT & PLAN:   Cancer Staging  Cancer of transverse colon [facility]) Staging form: Colon and Rectum, [name], [name], [name], MD on 10/26/2021   Cancer of transverse colon (HCC) Overall he tolerates chemotherapy.   CEA is relatively stable with small fluctuations. CT images were reviewed and findings were discussed.  Small lung nodules, slight increase in size, likely due to slow progression of lung metastatic disease. Not able to further characterize with PET due to size. Continue monitor.   Labs reviewed and discussed with patient Proceed with 5-FU/bevacizumab treatment today Continue current regimen.   Encounter for antineoplastic chemotherapy Chemotherapy plan as listed above.   Anemia due to chemotherapy Mild anemia. continue to monitor.  Hemoglobin is close to baseline.  Follow-up 2 weeks lab MD 5-FU/bevacizumab -   All questions were answered. The patient knows to call the clinic with any problems, questions or concerns.  [name], MD, PhD [facility] Hematology Oncology 10/10/2022     PERTINENT ONCOLOGY HISTORY Jared Gutierrez is a 82 y.o.[place] who has above oncology history reviewed by me today presented for follow up visit for management of Metastatic colon cancer. [name], patient switched care to me on 11/11/20 Extensive medical record review was performed by me  Oncology History  History of colon cancer, stage I  10/02/2014 Pathology Results   [id number] colonoscopy pathology A. CECAL POLYPS; HOT SNARE AND COLD BIOPSY:  - TUBULAR ADENOMA, MULTIPLE FRAGMENTS.  - NEGATIVE FOR HIGH-GRADE DYSPLASIA AND MALIGNANCY.   B. COLON POLYP, TRANSVERSE; COLD BIOPSY:  - TUBULAR ADENOMA.   - NEGATIVE FOR HIGH-GRADE DYSPLASIA AND MALIGNANCY.     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  Tumor extended into the submucosa. Margins were negative. + lymphovascular invasion. 14 lymph nodes were negative. Pathologic stage was T1 N0.  TRANSVERSE COLON, RESECTION:  - INVASIVE ADENOCARCINOMA ARISING IN A 4.6 CM TUBULOVILLOUS  ADENOMA WITH HIGH GRADE DYSPLASIA (MALIGNANT POLYP).  - SEE SUMMARY BELOW.  [name]  ONCOLOGY SUMMARY: COLON AND RECTUM, RESECTION, AJCC 7TH EDITION  Specimen: transverse colon  Procedure: transverse colon resection  Tumor site: splenic flexure  Tumor size: 1.0 cm (invasive component)  Macroscopic tumor perforation: not specified  Histologic type: invasive adenocarcinoma  Histologic grade: moderately differentiated  Microscopic tumor extension: into submucosa  Margins:       Proximal margin: negative       Distal margin: negative       Circumferential (radial) or  mesenteric margin: negative       If all margins uninvolved by invasive carcinoma:       Distance of invasive carcinoma from closest margin: 7.5 cm  to distal margin  Treatment effect: not applicable  Lymph-vascular invasion: present   Perineural invasion: not identified  Tumor deposits (discontinuous extramural extension): not  identified  Pathologic staging:       Primary tumor: pT1       Regional lymph nodes: pN0            Number of nodes examined: 14            Number of nodes involved: 0    04/02/2019 Progression   04/02/2019  PET scan  limited evealed a 2.4 x 2.3 cm (SUV 11) hypermetabolic soft tissue density caudal and anterior to the pancreatic neck favored to represent isolated peritoneal or nodal metastasis in the setting of prior transverse colonic resection (expected primary drainage). Although this was immediately adjacent to the pancreas, a fat plane was maintained, arguing strongly against a pancreatic primary. Otherwise, there was no evidence of hypermetabolic metastasis. 04/17/2019 EUS on  revealed a normal esophagus, stomach, duodenum, and pancreas.  There was a 2.4 x 2.4 cm irregular mass in the retroperitoneum adjacent to, but not involving the pancreatic neck.  FNA and core needle biopsy were performed.  Pathology revealed adenocarcinoma compatible with a metastatic lesion of colorectal origin.  Tumor cells were positive for CK20 and CDX2 and negative for CK7.  NGS: [employer] on 05/15/2019 revealed + [id number] and TP53.  Negative results included BRAF V600E, Her2, NRAS, NTRK, PD-L1 (<1%), and TMB 8.7/Mb (intermediate).  MMR testing from his colon resection on 10/16/2013 was intact with a low probability of MSI-H.   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  He received Neulasta after cycle #4 and #5 secondary to progressive leukopenia.  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  He received a truncated course of FOLFOX with cycle #11 secondary to oxaliplatin reaction.   01/14/2020 Imaging    PET revealed an interval decrease in size and FDG uptake  (2.4 x 2.3 cm with SUV 11 to 2.4 x 1.7 cm with SUV 4.09) associated with the previously referenced soft tissue density caudal and anterior to the pancreatic neck suggesting treatment response. There were no new sites of FDG avid tumor.   08/31/2020 Imaging   08/31/2020 Abdomen and pelvis CT revealed increased size of the index soft tissue lesion inferior and anterior to the pancreatic neck (2.9 x 1.8 cm to 3.5 x 2.9 cm). There were similar prominent retroperitoneal lymph nodes without adenopathy by size criteria. There was no new or enlarging abdominal or pelvic lymph nodes. There were no new interval findings. There was similar circumferential wall thickening of a nondistended urinary bladder, which likely accentuated wall thickening There was hepatic steatosis and aortic atherosclerosis.   11/03/2020, PET showed recurrent peritoneal metastasis in the upper abdomen adjacent to the pancreas.  The lesion is 3.8 x 2.9 cm with SUV of 11.3. No evidence of metastatic peritoneal disease elsewhere in the abdomen pelvis.  No evidence of distant metastasis.   11/03/2020 Imaging   PET scan showed recurrent peritoneal metastasis near pancreas. Given that he has no other distant metastasis. Discussed with radiation oncology. Repeat SBRT may be considered if he does not respond well  to systemic chemotherapy.     11/26/2020 -  Chemotherapy   5-FU and bevacizumab.   02/17/2021 Imaging   02/17/2021 CT abdomen pelvis showed partial response. Mild decrease of peritoneal lesion.  Proceed with 5-FU and bevacizumab today.  Given that he is tolerating current regimen with good life quality, partial response.  Shared decision was made to hold off adding additional chemotherapy agents.   06/08/2021, CT chest abdomen pelvis with contrast showed soft tissue mass at the base of mesentery inferior to the pancreatic neck is unchanged in size.  No progressive disease was identified.   09/07/2021, CT chest abdomen pelvis with  contrast showed no substantial changes in size of the mesenteric mass, 2.6 cm.  No suspicious new lesions.  Chronic findings as detailed in the imaging report.   12/05/2021 Imaging   PET scan showed soft tissue mesenteric mass is unchanged in size.  Decreased FDG uptake compared to activity on November 03, 2020.  Fever treated disease.  No new metastatic disease.   03/21/2022 Imaging   CT chest abdomen pelvis  1. Similar size of the peripancreatic presumed peritoneal implant compared to 09/07/2021. 2. No new or progressive disease. 3. Incidental findings, including: Coronary artery atherosclerosis.Aortic Atherosclerosis (ICD10-I70.0). Prostatomegaly with chronic bladder wall thickening, suggesting outlet obstruction.     07/11/2022 Imaging   CT chest abdomen pelvis with contrast showed 1. New 6 mm nodule in the right upper lobe and 2 mm nodule in the left upper lobe. Although small these are concerning. Recommend short follow-up in 3 months 2. Stable low-attenuation lesion along the midbody of the pancreas. Continued attention on follow-up. No new peritoneal mass lesions or ascites. 3. Fatty liver infiltration 4. Colonic surgical changes   10/03/2022 Imaging   CT chest abdomen pelvis w contrast showed 1. Slight interval enlargement and cavitation of a nodule in the right upper lobe, measuring 0.6 cm, previously 0.5 cm. Slight interval enlargement of a left upper lobe nodule measuring 0.3 cm,previously 0.2 cm. Findings are concerning for slowly enlarging small pulmonary metastases. 2. No significant change in a lobulated soft tissue mass within the central small bowel mesentery abutting the inferior aspect of the pancreas, consistent with a stable, treated metastasis. 3. No other evidence of lymphadenopathy or metastatic disease in the chest, abdomen, or pelvis. 4. Prostatomegaly with diffuse wall thickening of the relatively decompressed urinary bladder, likely secondary to chronic outlet  obstruction. 5. Unchanged fibrotic scarring and bronchiectasis of the bilateral lung bases. This could be further assessed by pulmonary referral and dedicated interstitial lung disease protocol CT examination of the chest if and when clinically appropriate in the setting of known metastatic malignancy. Aortic Atherosclerosis     Other medical problems Chronic lower extremity edema. 12/24/2018, right lower extremity duplex negative for DVT.  Small right Baker's cyst. 08/16/2019, bilateral lower extremity duplex showed no DVT. 08/16/2019 - 08/17/2019 with right lower extremity cellulitis.  He was unable to bear weight.  He was treated with IV fluids, NSAIDs, colchicine, and broad antibiotics (vancomycin and Cefepime).  He was discharged on indomethacin x 5 days and Keflex 500 mg TID x 5 days.  10/05/2020, colonoscopy showed internal hemorrhoids.  Otherwise normal examination.  INTERVAL HISTORY Jared Gutierrez is a 82 y.o. male who has above history reviewed by me today presents for follow up visit for management of recurrent metastatic colon cancer Patient was accompanied by wife.   Denies fever, chills, nausea, vomiting, diarrhea, chest pain, shortness of breath, abdominal pain,  Review of Systems  Constitutional:  Negative for appetite change, chills, diaphoresis, fever and unexpected weight change.  HENT:   Negative for hearing loss, nosebleeds, sore throat and tinnitus.   Respiratory:  Negative for cough, hemoptysis and shortness of breath.   Cardiovascular:  Negative for chest pain and palpitations.  Gastrointestinal:  Negative for abdominal pain, blood in stool, constipation, diarrhea, nausea and vomiting.  Genitourinary:  Negative for dysuria, frequency and hematuria.   Musculoskeletal:  Positive for arthralgias. Negative for back pain, myalgias and neck pain.  Skin:  Negative for itching and rash.  Neurological:  Positive for numbness. Negative for dizziness and headaches.   Hematological:  Does not bruise/bleed easily.  Psychiatric/Behavioral:  Negative for depression. The patient is not nervous/anxious.       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   Chicken pox    Colon cancer (HCC)    History of kidney stones    Hypertension    Lipoma of colon    Nephrolithiasis    Nephrolithiasis    Obesity    Shingles    Tubular adenoma of colon    multiple fragments    Past Surgical History:  Procedure Laterality Date   COLON SURGERY     COLONOSCOPY N/A 10/02/2014   Procedure: COLONOSCOPY;  Surgeon: Elnita Maxwell, MD;  Location: [facility] ENDOSCOPY;  Service: Endoscopy;  Laterality: N/A;   COLONOSCOPY WITH PROPOFOL N/A 02/16/2017   Procedure: COLONOSCOPY WITH PROPOFOL;  Surgeon: Scot Jun, MD;  Location: [facility] ENDOSCOPY;  Service: Endoscopy;  Laterality: N/A;   COLONOSCOPY WITH PROPOFOL N/A 10/05/2020   Procedure: COLONOSCOPY WITH PROPOFOL;  Surgeon: Regis Bill, MD;  Location: [facility] ENDOSCOPY;  Service: Endoscopy;  Laterality: N/A;   EUS N/A 04/17/2019   Procedure: FULL UPPER ENDOSCOPIC ULTRASOUND (EUS) RADIAL;  Surgeon: Bearl Mulberry, MD;  Location: [facility] ENDOSCOPY;  Service: Gastroenterology;  Laterality: N/A;   KIDNEY STONE SURGERY     PARTIAL COLECTOMY  10/17/2013   PORTACATH PLACEMENT Right 06/13/2019   Procedure: INSERTION PORT-A-CATH;  Surgeon: Sung Amabile, DO;  Location: [facility] ORS;  Service: General;  Laterality: Right;    Family History  Problem Relation Age of Onset   Cancer Mother    Breast cancer Mother    COPD Father     Social History:  reports that he has never smoked. He has never used smokeless tobacco. He reports current alcohol use. He reports that he does not use drugs.    Allergies: No Known Allergies  Current Medications: Current Outpatient Medications  Medication Sig Dispense Refill   acetaminophen (TYLENOL)  500 MG tablet Take 500 mg by mouth every 6 (six) hours as needed.     amLODipine (NORVASC) 2.5 MG tablet Take 2.5 mg by mouth daily.     aspirin EC 81 MG tablet Take 81 mg by mouth daily.     Calcium Carbonate (CALCIUM 600 PO) Take 1 tablet by mouth 2 (two) times daily.     Cholecalciferol (VITAMIN D) 125 MCG (5000 UT) CAPS Take 5,000 mg by mouth.     docusate sodium (COLACE) 100 MG capsule Take 1 capsule (100 mg total) by mouth 2 (two) times daily as needed for mild constipation or moderate constipation. 60 capsule 3   HYDROcodone-acetaminophen (NORCO/VICODIN) 5-325 MG tablet Take 1 tablet by mouth every 6 (six) hours as needed for  moderate pain (up to 3 doses for moderate pain.).     lidocaine-prilocaine (EMLA) cream APPLY TO AFFECTED AREA ONCE AS DIRECTED 30 g 3   loperamide (IMODIUM) 2 MG capsule Take 1 capsule (2 mg total) by mouth See admin instructions. Take 2 tablets after first loose stool,  then 1 tablet  after each loose stool; maximum: 8 tablets /day 60 capsule 0   loratadine (CLARITIN) 10 MG tablet Take 10 mg by mouth daily.     olmesartan (BENICAR) 20 MG tablet Take 1 tablet by mouth daily.     ondansetron (ZOFRAN) 8 MG tablet Take 1 tablet (8 mg total) by mouth 2 (two) times daily as needed for refractory nausea / vomiting. Start on day 3 after chemotherapy. 60 tablet 1   potassium chloride SA (KLOR-CON M20) 20 MEQ tablet Take 1 tablet (20 mEq total) by mouth daily. 180 tablet 1   Probiotic Product (PROBIOTIC DAILY PO) Take 1 tablet by mouth daily.     prochlorperazine (COMPAZINE) 10 MG tablet Take 1 tablet (10 mg total) by mouth every 6 (six) hours as needed (NAUSEA). 30 tablet 1   senna-docusate (SENNA S) 8.6-50 MG tablet Take 2 tablets by mouth daily. 60 tablet 3   No current facility-administered medications for this visit.   Facility-Administered Medications Ordered in Other Visits  Medication Dose Route Frequency Provider Last Rate Last Admin   0.9 %  sodium chloride  infusion   Intravenous Once Corcoran, Melissa C, MD       0.9 %  sodium chloride infusion   Intravenous Continuous Rosey Bath, MD 10 mL/hr at 12/31/19 1000 New Bag at 10/05/20 1045   fluorouracil (ADRUCIL) 5,000 mg in sodium chloride 0.9 % 150 mL chemo infusion  2,400 mg/m2 (Treatment Plan Recorded) Intravenous 1 day or 1 dose [name], MD   Infusion Verify at 10/10/22 1249   heparin lock flush 100 unit/mL  500 Units Intravenous Once Rosey Bath, MD        Performance status (ECOG): 1  Vitals Blood pressure 122/73, pulse 82, temperature (!) 97 F (36.1 C), temperature source Tympanic, resp. rate 18, weight 225 lb 6.4 oz (102.2 kg), SpO2 100 %.   Physical Exam Constitutional:      General: He is not in acute distress.    Appearance: He is obese. He is not diaphoretic.  HENT:     Head: Normocephalic and atraumatic.  Eyes:     General: No scleral icterus.    Pupils: Pupils are equal, round, and reactive to light.  Cardiovascular:     Rate and Rhythm: Normal rate and regular rhythm.     Heart sounds: No murmur heard. Pulmonary:     Effort: Pulmonary effort is normal. No respiratory distress.     Breath sounds: No wheezing.     Comments: Decreased breath sound bilaterally.  Abdominal:     General: There is no distension.     Palpations: Abdomen is soft.     Tenderness: There is no abdominal tenderness.  Musculoskeletal:        General: Normal range of motion.     Cervical back: Normal range of motion and neck supple.  Skin:    General: Skin is warm and dry.     Findings: No erythema.  Neurological:     Mental Status: He is alert and oriented to person, place, and time. Mental status is at baseline.     Cranial Nerves: No cranial nerve deficit.  Motor: No abnormal muscle tone.  Psychiatric:        Mood and Affect: Mood and affect normal.     Labs were reviewed by me.     Latest Ref Rng & Units 10/10/2022    8:47 AM 09/26/2022    8:07 AM 09/12/2022    8:34  AM  CBC  WBC 4.0 - 10.5 K/uL 4.5  4.3  3.4   Hemoglobin 13.0 - 17.0 g/dL 16.1  09.6  04.5   Hematocrit 39.0 - 52.0 % 34.1  34.7  35.2   Platelets 150 - 400 K/uL 172  158  181       Latest Ref Rng & Units 10/10/2022    8:47 AM 09/26/2022    8:07 AM 09/12/2022    8:34 AM  CMP  Glucose 70 - 99 mg/dL 409  811  914   BUN 8 - 23 mg/dL 10  14  7    Creatinine 0.61 - 1.24 mg/dL 7.82  9.56  2.13   Sodium 135 - 145 mmol/L 132  136  134   Potassium 3.5 - 5.1 mmol/L 3.5  3.5  3.5   Chloride 98 - 111 mmol/L 100  103  103   CO2 22 - 32 mmol/L 22  24  21    Calcium 8.9 - 10.3 mg/dL 9.0  9.1  8.7   Total Protein 6.5 - 8.1 g/dL 6.2  6.0  6.1   Total Bilirubin 0.3 - 1.2 mg/dL 0.9  1.5  1.5   Alkaline Phos 38 - 126 U/L 42  41  42   AST 15 - 41 U/L 36  37  44   ALT 0 - 44 U/L 12  11  14

## 2022-10-10 NOTE — Assessment & Plan Note (Signed)
Mild anemia. continue to monitor.  Hemoglobin is close to baseline. 

## 2022-10-10 NOTE — Assessment & Plan Note (Signed)
Chemotherapy plan as listed above 

## 2022-10-10 NOTE — Assessment & Plan Note (Addendum)
Overall he tolerates chemotherapy.   CEA is relatively stable with small fluctuations. CT images were reviewed and findings were discussed.  Small lung nodules, slight increase in size, likely due to slow progression of lung metastatic disease. Not able to further characterize with PET due to size. Continue monitor.   Labs reviewed and discussed with patient Proceed with 5-FU/bevacizumab treatment today Continue current regimen.

## 2022-10-12 ENCOUNTER — Inpatient Hospital Stay: Payer: Medicare Other

## 2022-10-12 VITALS — BP 160/80 | HR 72 | Resp 18

## 2022-10-12 DIAGNOSIS — Z5112 Encounter for antineoplastic immunotherapy: Secondary | ICD-10-CM | POA: Diagnosis not present

## 2022-10-12 DIAGNOSIS — C786 Secondary malignant neoplasm of retroperitoneum and peritoneum: Secondary | ICD-10-CM

## 2022-10-12 DIAGNOSIS — Z85038 Personal history of other malignant neoplasm of large intestine: Secondary | ICD-10-CM

## 2022-10-12 MED ORDER — HEPARIN SOD (PORK) LOCK FLUSH 100 UNIT/ML IV SOLN
500.0000 [IU] | Freq: Once | INTRAVENOUS | Status: AC | PRN
  Administered 2022-10-12: 500 [IU]
  Filled 2022-10-12: qty 5

## 2022-10-12 MED ORDER — SODIUM CHLORIDE 0.9% FLUSH
10.0000 mL | INTRAVENOUS | Status: DC | PRN
  Administered 2022-10-12: 10 mL
  Filled 2022-10-12: qty 10

## 2022-10-23 MED FILL — Dexamethasone Sodium Phosphate Inj 100 MG/10ML: INTRAMUSCULAR | Qty: 1 | Status: AC

## 2022-10-24 ENCOUNTER — Inpatient Hospital Stay (HOSPITAL_BASED_OUTPATIENT_CLINIC_OR_DEPARTMENT_OTHER): Payer: Medicare Other | Admitting: Oncology

## 2022-10-24 ENCOUNTER — Encounter: Payer: Self-pay | Admitting: Oncology

## 2022-10-24 ENCOUNTER — Inpatient Hospital Stay: Payer: Medicare Other

## 2022-10-24 ENCOUNTER — Inpatient Hospital Stay: Payer: Medicare Other | Attending: Oncology

## 2022-10-24 VITALS — BP 130/77 | HR 72 | Temp 97.2°F | Resp 18 | Wt 229.1 lb

## 2022-10-24 DIAGNOSIS — Z86010 Personal history of colonic polyps: Secondary | ICD-10-CM | POA: Insufficient documentation

## 2022-10-24 DIAGNOSIS — Z7982 Long term (current) use of aspirin: Secondary | ICD-10-CM | POA: Diagnosis not present

## 2022-10-24 DIAGNOSIS — Z5112 Encounter for antineoplastic immunotherapy: Secondary | ICD-10-CM | POA: Insufficient documentation

## 2022-10-24 DIAGNOSIS — C184 Malignant neoplasm of transverse colon: Secondary | ICD-10-CM

## 2022-10-24 DIAGNOSIS — Z836 Family history of other diseases of the respiratory system: Secondary | ICD-10-CM | POA: Diagnosis not present

## 2022-10-24 DIAGNOSIS — C786 Secondary malignant neoplasm of retroperitoneum and peritoneum: Secondary | ICD-10-CM | POA: Insufficient documentation

## 2022-10-24 DIAGNOSIS — Z809 Family history of malignant neoplasm, unspecified: Secondary | ICD-10-CM | POA: Insufficient documentation

## 2022-10-24 DIAGNOSIS — Z5111 Encounter for antineoplastic chemotherapy: Secondary | ICD-10-CM | POA: Insufficient documentation

## 2022-10-24 DIAGNOSIS — K648 Other hemorrhoids: Secondary | ICD-10-CM | POA: Diagnosis not present

## 2022-10-24 DIAGNOSIS — T451X5A Adverse effect of antineoplastic and immunosuppressive drugs, initial encounter: Secondary | ICD-10-CM

## 2022-10-24 DIAGNOSIS — Z452 Encounter for adjustment and management of vascular access device: Secondary | ICD-10-CM | POA: Insufficient documentation

## 2022-10-24 DIAGNOSIS — T451X5D Adverse effect of antineoplastic and immunosuppressive drugs, subsequent encounter: Secondary | ICD-10-CM | POA: Diagnosis not present

## 2022-10-24 DIAGNOSIS — Z85038 Personal history of other malignant neoplasm of large intestine: Secondary | ICD-10-CM

## 2022-10-24 DIAGNOSIS — D6481 Anemia due to antineoplastic chemotherapy: Secondary | ICD-10-CM | POA: Insufficient documentation

## 2022-10-24 DIAGNOSIS — Z79899 Other long term (current) drug therapy: Secondary | ICD-10-CM | POA: Diagnosis not present

## 2022-10-24 DIAGNOSIS — Z803 Family history of malignant neoplasm of breast: Secondary | ICD-10-CM | POA: Insufficient documentation

## 2022-10-24 LAB — COMPREHENSIVE METABOLIC PANEL
ALT: 13 U/L (ref 0–44)
AST: 27 U/L (ref 15–41)
Albumin: 3.4 g/dL — ABNORMAL LOW (ref 3.5–5.0)
Alkaline Phosphatase: 39 U/L (ref 38–126)
Anion gap: 6 (ref 5–15)
BUN: 10 mg/dL (ref 8–23)
CO2: 24 mmol/L (ref 22–32)
Calcium: 9.4 mg/dL (ref 8.9–10.3)
Chloride: 103 mmol/L (ref 98–111)
Creatinine, Ser: 0.71 mg/dL (ref 0.61–1.24)
GFR, Estimated: >60 mL/min (ref >60)
Glucose, Bld: 118 mg/dL — ABNORMAL HIGH (ref 70–99)
Potassium: 3.5 mmol/L (ref 3.5–5.1)
Sodium: 133 mmol/L — ABNORMAL LOW (ref 135–145)
Total Bilirubin: 1 mg/dL (ref 0.3–1.2)
Total Protein: 6.1 g/dL — ABNORMAL LOW (ref 6.5–8.1)

## 2022-10-24 LAB — CBC WITH DIFFERENTIAL/PLATELET
Abs Immature Granulocytes: 0.01 10*3/uL (ref 0.00–0.07)
Basophils Absolute: 0 10*3/uL (ref 0.0–0.1)
Basophils Relative: 1 %
Eosinophils Absolute: 0.1 10*3/uL (ref 0.0–0.5)
Eosinophils Relative: 2 %
HCT: 34.1 % — ABNORMAL LOW (ref 39.0–52.0)
Hemoglobin: 11.1 g/dL — ABNORMAL LOW (ref 13.0–17.0)
Immature Granulocytes: 0 %
Lymphocytes Relative: 33 %
Lymphs Abs: 1.6 10*3/uL (ref 0.7–4.0)
MCH: 29.8 pg (ref 26.0–34.0)
MCHC: 32.6 g/dL (ref 30.0–36.0)
MCV: 91.7 fL (ref 80.0–100.0)
Monocytes Absolute: 0.5 10*3/uL (ref 0.1–1.0)
Monocytes Relative: 10 %
Neutro Abs: 2.6 10*3/uL (ref 1.7–7.7)
Neutrophils Relative %: 54 %
Platelets: 150 10*3/uL (ref 150–400)
RBC: 3.72 MIL/uL — ABNORMAL LOW (ref 4.22–5.81)
RDW: 15.4 % (ref 11.5–15.5)
WBC: 4.8 10*3/uL (ref 4.0–10.5)
nRBC: 0 % (ref 0.0–0.2)

## 2022-10-24 LAB — PROTEIN, URINE, RANDOM: Total Protein, Urine: 48 mg/dL

## 2022-10-24 MED ORDER — SODIUM CHLORIDE 0.9% FLUSH
10.0000 mL | INTRAVENOUS | Status: DC | PRN
  Administered 2022-10-24: 10 mL via INTRAVENOUS
  Filled 2022-10-24: qty 10

## 2022-10-24 MED ORDER — FLUOROURACIL CHEMO INJECTION 2.5 GM/50ML
400.0000 mg/m2 | Freq: Once | INTRAVENOUS | Status: AC
  Administered 2022-10-24: 900 mg via INTRAVENOUS
  Filled 2022-10-24: qty 18

## 2022-10-24 MED ORDER — SODIUM CHLORIDE 0.9 % IV SOLN
5.0000 mg/kg | Freq: Once | INTRAVENOUS | Status: AC
  Administered 2022-10-24: 500 mg via INTRAVENOUS
  Filled 2022-10-24: qty 16

## 2022-10-24 MED ORDER — SODIUM CHLORIDE 0.9 % IV SOLN
10.0000 mg | Freq: Once | INTRAVENOUS | Status: AC
  Administered 2022-10-24: 10 mg via INTRAVENOUS
  Filled 2022-10-24: qty 10

## 2022-10-24 MED ORDER — SODIUM CHLORIDE 0.9 % IV SOLN
Freq: Once | INTRAVENOUS | Status: AC
  Filled 2022-10-24: qty 250

## 2022-10-24 MED ORDER — SODIUM CHLORIDE 0.9 % IV SOLN
400.0000 mg/m2 | Freq: Once | INTRAVENOUS | Status: AC
  Administered 2022-10-24: 884 mg via INTRAVENOUS
  Filled 2022-10-24: qty 44.2

## 2022-10-24 MED ORDER — SODIUM CHLORIDE 0.9 % IV SOLN
2400.0000 mg/m2 | INTRAVENOUS | Status: DC
  Administered 2022-10-24: 5000 mg via INTRAVENOUS
  Filled 2022-10-24: qty 100

## 2022-10-24 NOTE — Assessment & Plan Note (Signed)
Chemotherapy plan as listed above 

## 2022-10-24 NOTE — Assessment & Plan Note (Signed)
Same as above

## 2022-10-24 NOTE — Patient Instructions (Signed)
[facility] [place] [facility]  Discharge Instructions: Thank you for choosing [facility] to provide your oncology and hematology care.  If you have a lab appointment with the Cancer Center, please go directly to the Cancer Center and check in at the registration area.  Wear comfortable clothing and clothing appropriate for easy access to any Portacath or PICC line.   We strive to give you quality time with your provider. You may need to reschedule your appointment if you arrive late (15 or more minutes).  Arriving late affects you and other patients whose appointments are after yours.  Also, if you miss three or more appointments without notifying the office, you may be dismissed from the clinic at the provider's discretion.      For prescription refill requests, have your pharmacy contact our office and allow 72 hours for refills to be completed.     To help prevent nausea and vomiting after your treatment, we encourage you to take your nausea medication as directed.  BELOW ARE SYMPTOMS THAT SHOULD BE REPORTED IMMEDIATELY: *FEVER GREATER THAN 100.4 F (38 C) OR HIGHER *CHILLS OR SWEATING *NAUSEA AND VOMITING THAT IS NOT CONTROLLED WITH YOUR NAUSEA MEDICATION *UNUSUAL SHORTNESS OF BREATH *UNUSUAL BRUISING OR BLEEDING *URINARY PROBLEMS (pain or burning when urinating, or frequent urination) *BOWEL PROBLEMS (unusual diarrhea, constipation, pain near the anus) TENDERNESS IN MOUTH AND THROAT WITH OR WITHOUT PRESENCE OF ULCERS (sore throat, sores in mouth, or a toothache) UNUSUAL RASH, SWELLING OR PAIN  UNUSUAL VAGINAL DISCHARGE OR ITCHING   Items with * indicate a potential emergency and should be followed up as soon as possible or go to the Emergency Department if any problems should occur.  Please show the CHEMOTHERAPY ALERT CARD or IMMUNOTHERAPY ALERT CARD at check-in to the Emergency Department and triage nurse.  Should you have questions after your visit  or need to cancel or reschedule your appointment, please contact [facility] [place] [facility]  [phone] and follow the prompts.  Office hours are 8:00 a.m. to 4:30 p.m. Monday - Friday. Please note that voicemails left after 4:00 p.m. may not be returned until the following business day.  We are closed weekends and major holidays. You have access to a nurse at all times for urgent questions. Please call the main number to the clinic [phone] and follow the prompts.  For any non-urgent questions, you may also contact your provider using MyChart. We now offer e-Visits for anyone 18 and older to request care online for non-urgent symptoms. For details visit [email or web address].   Also download the MyChart app! Go to the app store, search "MyChart", open the app, select [address], and log in with your MyChart username and password.    

## 2022-10-24 NOTE — Assessment & Plan Note (Signed)
Mild anemia. continue to monitor.  Hemoglobin is close to baseline. 

## 2022-10-24 NOTE — Assessment & Plan Note (Addendum)
Overall he tolerates chemotherapy.   CEA is relatively stable with small fluctuations. CT images shows slight lung progression.he is asymptomatic. I recommend continue current regimen and close monitor on surveillance.  Labs reviewed and discussed with patient Proceed with 5-FU/bevacizumab treatment today Continue current regimen.

## 2022-10-24 NOTE — Progress Notes (Signed)
Hematology/Oncology Progress note Telephone:([phone] Fax:([phone]) [phone]    Chief Complaint: Jared Gutierrez is a 82 y.o. male presents for follow-up of metastatic colon cancer   ASSESSMENT & PLAN:   Cancer Staging  Cancer of transverse colon [facility]) Staging form: Colon and Rectum, [name], [name], [name], MD on 10/26/2021   Cancer of transverse colon (HCC) Overall he tolerates chemotherapy.   CEA is relatively stable with small fluctuations. CT images shows slight lung progression.he is asymptomatic. I recommend continue current regimen and close monitor on surveillance.  Labs reviewed and discussed with patient Proceed with 5-FU/bevacizumab treatment today Continue current regimen.   Encounter for antineoplastic chemotherapy Chemotherapy plan as listed above.   Anemia due to chemotherapy Mild anemia. continue to monitor.  Hemoglobin is close to baseline.  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   Follow-up 2 weeks lab MD 5-FU/bevacizumab -   All questions were answered. The patient knows to call the clinic with any problems, questions or concerns.  [name], MD, PhD [facility] Hematology Oncology 10/24/2022     PERTINENT ONCOLOGY HISTORY Jared Gutierrez is a 82 y.o.[place] who has above oncology history reviewed by me today presented for follow up visit for management of Metastatic colon cancer. [name], patient switched care to me on 11/11/20 Extensive medical record review was performed by me  Oncology History  History of colon cancer, stage I  10/02/2014 Pathology Results   [id number] colonoscopy pathology A. CECAL POLYPS; HOT SNARE AND COLD BIOPSY:  - TUBULAR ADENOMA, MULTIPLE FRAGMENTS.  - NEGATIVE FOR HIGH-GRADE DYSPLASIA AND MALIGNANCY.   B. COLON POLYP, TRANSVERSE; COLD BIOPSY:  - TUBULAR ADENOMA.  - NEGATIVE FOR HIGH-GRADE DYSPLASIA AND  MALIGNANCY.     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  Tumor extended into the submucosa. Margins were negative. + lymphovascular invasion. 14 lymph nodes were negative. Pathologic stage was T1 N0.  TRANSVERSE COLON, RESECTION:  - INVASIVE ADENOCARCINOMA ARISING IN A 4.6 CM TUBULOVILLOUS  ADENOMA WITH HIGH GRADE DYSPLASIA (MALIGNANT POLYP).  - SEE SUMMARY BELOW.  [name]  ONCOLOGY SUMMARY: COLON AND RECTUM, RESECTION, AJCC 7TH EDITION  Specimen: transverse colon  Procedure: transverse colon resection  Tumor site: splenic flexure  Tumor size: 1.0 cm (invasive component)  Macroscopic tumor perforation: not specified  Histologic type: invasive adenocarcinoma  Histologic grade: moderately differentiated  Microscopic tumor extension: into submucosa  Margins:       Proximal margin: negative       Distal margin: negative       Circumferential (radial) or mesenteric margin: negative       If  all margins uninvolved by invasive carcinoma:       Distance of invasive carcinoma from closest margin: 7.5 cm  to distal margin  Treatment effect: not applicable  Lymph-vascular invasion: present  Perineural invasion: not identified  Tumor  deposits (discontinuous extramural extension): not  identified  Pathologic staging:       Primary tumor: pT1       Regional lymph nodes: pN0            Number of nodes examined: 14            Number of nodes involved: 0    04/02/2019 Progression   04/02/2019  PET scan  limited evealed a 2.4 x 2.3 cm (SUV 11) hypermetabolic soft tissue density caudal and anterior to the pancreatic neck favored to represent isolated peritoneal or nodal metastasis in the setting of prior transverse colonic resection (expected primary drainage). Although this was immediately adjacent to the pancreas, a fat plane was maintained, arguing strongly against a pancreatic primary. Otherwise, there was no evidence of hypermetabolic metastasis. 04/17/2019 EUS on  revealed a normal esophagus, stomach, duodenum, and pancreas.  There was a 2.4 x 2.4 cm irregular mass in the retroperitoneum adjacent to, but not involving the pancreatic neck.  FNA and core needle biopsy were performed.  Pathology revealed adenocarcinoma compatible with a metastatic lesion of colorectal origin.  Tumor cells were positive for CK20 and CDX2 and negative for CK7.  NGS: [employer] on 05/15/2019 revealed + KRASG13D and TP53.  Negative results included BRAF V600E, Her2, NRAS, NTRK, PD-L1 (<1%), and TMB 8.7/Mb (intermediate).  MMR testing from his colon resection on 10/16/2013 was intact with a low probability of MSI-H.   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  He received Neulasta after cycle #4 and #5 secondary to progressive leukopenia.  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  He received a truncated course of FOLFOX with cycle #11 secondary to oxaliplatin reaction.   01/14/2020 Imaging    PET revealed an interval decrease in size and FDG uptake (2.4 x 2.3 cm with SUV 11 to 2.4 x 1.7 cm with  SUV 4.09) associated with the previously referenced soft tissue density caudal and anterior to the pancreatic neck suggesting treatment response. There were no new sites of FDG avid tumor.   08/31/2020 Imaging   08/31/2020 Abdomen and pelvis CT revealed increased size of the index soft tissue lesion inferior and anterior to the pancreatic neck (2.9 x 1.8 cm to 3.5 x 2.9 cm). There were similar prominent retroperitoneal lymph nodes without adenopathy by size criteria. There was no new or enlarging abdominal or pelvic lymph nodes. There were no new interval findings. There was similar circumferential wall thickening of a nondistended urinary bladder, which likely accentuated wall thickening There was hepatic steatosis and aortic atherosclerosis.   11/03/2020, PET showed recurrent peritoneal metastasis in the upper abdomen adjacent to the pancreas.  The lesion is 3.8 x 2.9 cm with SUV of 11.3. No evidence of metastatic peritoneal disease elsewhere in the abdomen pelvis.  No evidence of distant metastasis.   11/03/2020 Imaging   PET scan showed recurrent peritoneal metastasis near pancreas. Given that he has no other distant metastasis. Discussed with radiation oncology. Repeat SBRT may be considered if he does not respond well to systemic chemotherapy.     11/26/2020 -  Chemotherapy   5-FU and bevacizumab.   02/17/2021 Imaging   02/17/2021 CT abdomen pelvis showed partial response. Mild decrease of peritoneal lesion.  Proceed with 5-FU and bevacizumab today.  Given that he is tolerating current regimen with good life quality, partial response.  Shared decision was made to hold off adding additional chemotherapy agents.   06/08/2021, CT chest abdomen pelvis with contrast showed soft tissue mass at the base of mesentery inferior to the pancreatic neck is unchanged in size.  No progressive disease was identified.   09/07/2021, CT chest abdomen pelvis with contrast showed no substantial changes in size of  the mesenteric mass, 2.6 cm.  No suspicious new lesions.  Chronic findings as detailed in the imaging report.   12/05/2021 Imaging   PET scan showed soft tissue mesenteric mass is unchanged in size.  Decreased FDG uptake compared to activity on November 03, 2020.  Fever treated disease.  No new metastatic disease.   03/21/2022 Imaging   CT chest abdomen pelvis  1. Similar size of the peripancreatic presumed peritoneal implant compared to 09/07/2021. 2. No new or progressive disease. 3. Incidental findings, including: Coronary artery atherosclerosis.Aortic Atherosclerosis (ICD10-I70.0). Prostatomegaly with chronic bladder wall thickening, suggesting outlet obstruction.     07/11/2022 Imaging   CT chest abdomen pelvis with contrast showed 1. New 6 mm nodule in the right upper lobe and 2 mm nodule in the left upper lobe. Although small these are concerning. Recommend short follow-up in 3 months 2. Stable low-attenuation lesion along the midbody of the pancreas. Continued attention on follow-up. No new peritoneal mass lesions or ascites. 3. Fatty liver infiltration 4. Colonic surgical changes   10/03/2022 Imaging   CT chest abdomen pelvis w contrast showed 1. Slight interval enlargement and cavitation of a nodule in the right upper lobe, measuring 0.6 cm, previously 0.5 cm. Slight interval enlargement of a left upper lobe nodule measuring 0.3 cm,previously 0.2 cm. Findings are concerning for slowly enlarging small pulmonary metastases. 2. No significant change in a lobulated soft tissue mass within the central small bowel mesentery abutting the inferior aspect of the pancreas, consistent with a stable, treated metastasis. 3. No other evidence of lymphadenopathy or metastatic disease in the chest, abdomen, or pelvis. 4. Prostatomegaly with diffuse wall thickening of the relatively decompressed urinary bladder, likely secondary to chronic outlet obstruction. 5. Unchanged fibrotic scarring and  bronchiectasis of the bilateral lung bases. This could be further assessed by pulmonary referral and dedicated interstitial lung disease protocol CT examination of the chest if and when clinically appropriate in the setting of known metastatic malignancy. Aortic Atherosclerosis     Other medical problems Chronic lower extremity edema. 12/24/2018, right lower extremity duplex negative for DVT.  Small right Baker's cyst. 08/16/2019, bilateral lower extremity duplex showed no DVT. 08/16/2019 - 08/17/2019 with right lower extremity cellulitis.  He was unable to bear weight.  He was treated with IV fluids, NSAIDs, colchicine, and broad antibiotics (vancomycin and Cefepime).  He was discharged on indomethacin x 5 days and Keflex 500 mg TID x 5 days.  10/05/2020, colonoscopy showed internal hemorrhoids.  Otherwise normal examination.  INTERVAL HISTORY Jared Gutierrez is a 82 y.o. male who has above history reviewed by me today presents for follow up visit for management of recurrent metastatic colon cancer Patient was accompanied by wife.   Denies fever, chills, nausea, vomiting, diarrhea, chest pain, shortness of breath, abdominal pain,    Review of Systems  Constitutional:  Negative for appetite change,  chills, diaphoresis, fever and unexpected weight change.  HENT:   Negative for hearing loss, nosebleeds, sore throat and tinnitus.   Respiratory:  Negative for cough, hemoptysis and shortness of breath.   Cardiovascular:  Negative for chest pain and palpitations.  Gastrointestinal:  Negative for abdominal pain, blood in stool, constipation, diarrhea, nausea and vomiting.  Genitourinary:  Negative for dysuria, frequency and hematuria.   Musculoskeletal:  Positive for arthralgias. Negative for back pain, myalgias and neck pain.  Skin:  Negative for itching and rash.  Neurological:  Positive for numbness. Negative for dizziness and headaches.  Hematological:  Does not bruise/bleed easily.   Psychiatric/Behavioral:  Negative for depression. The patient is not nervous/anxious.       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   Chicken pox    Colon cancer (HCC)    History of kidney stones    Hypertension    Lipoma of colon    Nephrolithiasis    Nephrolithiasis    Obesity    Shingles    Tubular adenoma of colon    multiple fragments    Past Surgical History:  Procedure Laterality Date   COLON SURGERY     COLONOSCOPY N/A 10/02/2014   Procedure: COLONOSCOPY;  Surgeon: Elnita Maxwell, MD;  Location: [facility] ENDOSCOPY;  Service: Endoscopy;  Laterality: N/A;   COLONOSCOPY WITH PROPOFOL N/A 02/16/2017   Procedure: COLONOSCOPY WITH PROPOFOL;  Surgeon: Scot Jun, MD;  Location: [facility] ENDOSCOPY;  Service: Endoscopy;  Laterality: N/A;   COLONOSCOPY WITH PROPOFOL N/A 10/05/2020   Procedure: COLONOSCOPY WITH PROPOFOL;  Surgeon: Regis Bill, MD;  Location: [facility] ENDOSCOPY;  Service: Endoscopy;  Laterality: N/A;   EUS N/A 04/17/2019   Procedure: FULL UPPER ENDOSCOPIC ULTRASOUND (EUS) RADIAL;  Surgeon: Bearl Mulberry, MD;  Location: [facility] ENDOSCOPY;  Service: Gastroenterology;  Laterality: N/A;   KIDNEY STONE SURGERY     PARTIAL COLECTOMY  10/17/2013   PORTACATH PLACEMENT Right 06/13/2019   Procedure: INSERTION PORT-A-CATH;  Surgeon: Sung Amabile, DO;  Location: [facility] ORS;  Service: General;  Laterality: Right;    Family History  Problem Relation Age of Onset   Cancer Mother    Breast cancer Mother    COPD Father     Social History:  reports that he has never smoked. He has never used smokeless tobacco. He reports current alcohol use. He reports that he does not use drugs.    Allergies: No Known Allergies  Current Medications: Current Outpatient Medications  Medication Sig Dispense Refill   acetaminophen (TYLENOL) 500 MG tablet Take 500 mg by mouth every 6 (six)  hours as needed.     amLODipine (NORVASC) 2.5 MG tablet Take 2.5 mg by mouth daily.     aspirin EC 81 MG tablet Take 81 mg by mouth daily.     Calcium Carbonate (CALCIUM 600 PO) Take 1 tablet by mouth 2 (two) times daily.     Cholecalciferol (VITAMIN D) 125 MCG (5000 UT) CAPS Take 5,000 mg by mouth.     docusate sodium (COLACE) 100 MG capsule Take 1 capsule (100 mg total) by mouth 2 (two) times daily as needed for mild constipation or moderate constipation. 60 capsule 3   HYDROcodone-acetaminophen (NORCO/VICODIN) 5-325 MG tablet Take 1 tablet by mouth every 6 (six) hours as needed for moderate pain (up to 3 doses for moderate pain.).  lidocaine-prilocaine (EMLA) cream APPLY TO AFFECTED AREA ONCE AS DIRECTED 30 g 3   loperamide (IMODIUM) 2 MG capsule Take 1 capsule (2 mg total) by mouth See admin instructions. Take 2 tablets after first loose stool,  then 1 tablet  after each loose stool; maximum: 8 tablets /day 60 capsule 0   loratadine (CLARITIN) 10 MG tablet Take 10 mg by mouth daily.     olmesartan (BENICAR) 20 MG tablet Take 1 tablet by mouth daily.     ondansetron (ZOFRAN) 8 MG tablet Take 1 tablet (8 mg total) by mouth 2 (two) times daily as needed for refractory nausea / vomiting. Start on day 3 after chemotherapy. 60 tablet 1   potassium chloride SA (KLOR-CON M20) 20 MEQ tablet Take 1 tablet (20 mEq total) by mouth daily. 180 tablet 1   Probiotic Product (PROBIOTIC DAILY PO) Take 1 tablet by mouth daily.     prochlorperazine (COMPAZINE) 10 MG tablet Take 1 tablet (10 mg total) by mouth every 6 (six) hours as needed (NAUSEA). 30 tablet 1   senna-docusate (SENNA S) 8.6-50 MG tablet Take 2 tablets by mouth daily. 60 tablet 3   No current facility-administered medications for this visit.   Facility-Administered Medications Ordered in Other Visits  Medication Dose Route Frequency Provider Last Rate Last Admin   0.9 %  sodium chloride infusion   Intravenous Once Corcoran, Melissa C, MD        0.9 %  sodium chloride infusion   Intravenous Continuous Nelva Nay C, MD 10 mL/hr at 12/31/19 1000 New Bag at 10/05/20 1045   fluorouracil (ADRUCIL) 5,000 mg in sodium chloride 0.9 % 150 mL chemo infusion  2,400 mg/m2 (Treatment Plan Recorded) Intravenous 1 day or 1 dose [name], MD   5,000 mg at 10/24/22 1145   heparin lock flush 100 unit/mL  500 Units Intravenous Once Rosey Bath, MD        Performance status (ECOG): 1  Vitals Blood pressure 130/77, pulse 72, temperature (!) 97.2 F (36.2 C), temperature source Tympanic, resp. rate 18, weight 229 lb 1.6 oz (103.9 kg), SpO2 100 %.   Physical Exam Constitutional:      General: He is not in acute distress.    Appearance: He is obese. He is not diaphoretic.  HENT:     Head: Normocephalic and atraumatic.  Eyes:     General: No scleral icterus.    Pupils: Pupils are equal, round, and reactive to light.  Cardiovascular:     Rate and Rhythm: Normal rate and regular rhythm.     Heart sounds: No murmur heard. Pulmonary:     Effort: Pulmonary effort is normal. No respiratory distress.     Breath sounds: No wheezing.     Comments: Decreased breath sound bilaterally.  Abdominal:     General: There is no distension.     Palpations: Abdomen is soft.     Tenderness: There is no abdominal tenderness.  Musculoskeletal:        General: Normal range of motion.     Cervical back: Normal range of motion and neck supple.  Skin:    General: Skin is warm and dry.     Findings: No erythema.  Neurological:     Mental Status: He is alert and oriented to person, place, and time. Mental status is at baseline.     Cranial Nerves: No cranial nerve deficit.     Motor: No abnormal muscle tone.  Psychiatric:  Mood and Affect: Mood and affect normal.     Labs were reviewed by me.     Latest Ref Rng & Units 10/24/2022    8:43 AM 10/10/2022    8:47 AM 09/26/2022    8:07 AM  CBC  WBC 4.0 - 10.5 K/uL 4.8  4.5  4.3   Hemoglobin  13.0 - 17.0 g/dL 69.6  29.5  28.4   Hematocrit 39.0 - 52.0 % 34.1  34.1  34.7   Platelets 150 - 400 K/uL 150  172  158       Latest Ref Rng & Units 10/24/2022    8:43 AM 10/10/2022    8:47 AM 09/26/2022    8:07 AM  CMP  Glucose 70 - 99 mg/dL 132  440  102   BUN 8 - 23 mg/dL 10  10  14    Creatinine 0.61 - 1.24 mg/dL 7.25  3.66  4.40   Sodium 135 - 145 mmol/L 133  132  136   Potassium 3.5 - 5.1 mmol/L 3.5  3.5  3.5   Chloride 98 - 111 mmol/L 103  100  103   CO2 22 - 32 mmol/L 24  22  24    Calcium 8.9 - 10.3 mg/dL 9.4  9.0  9.1   Total Protein 6.5 - 8.1 g/dL 6.1  6.2  6.0   Total Bilirubin 0.3 - 1.2 mg/dL 1.0  0.9  1.5   Alkaline Phos 38 - 126 U/L 39  42  41   AST 15 - 41 U/L 27  36  37   ALT 0 - 44 U/L 13  12  11

## 2022-10-25 LAB — CEA: CEA: 21.7 ng/mL — ABNORMAL HIGH (ref 0.0–4.7)

## 2022-10-26 ENCOUNTER — Inpatient Hospital Stay: Payer: Medicare Other

## 2022-10-26 VITALS — BP 169/64 | HR 64

## 2022-10-26 DIAGNOSIS — Z85038 Personal history of other malignant neoplasm of large intestine: Secondary | ICD-10-CM

## 2022-10-26 DIAGNOSIS — Z5112 Encounter for antineoplastic immunotherapy: Secondary | ICD-10-CM | POA: Diagnosis not present

## 2022-10-26 DIAGNOSIS — C786 Secondary malignant neoplasm of retroperitoneum and peritoneum: Secondary | ICD-10-CM

## 2022-10-26 MED ORDER — SODIUM CHLORIDE 0.9% FLUSH
10.0000 mL | INTRAVENOUS | Status: DC | PRN
  Administered 2022-10-26: 10 mL
  Filled 2022-10-26: qty 10

## 2022-10-26 MED ORDER — HEPARIN SOD (PORK) LOCK FLUSH 100 UNIT/ML IV SOLN
500.0000 [IU] | Freq: Once | INTRAVENOUS | Status: AC | PRN
  Administered 2022-10-26: 500 [IU]
  Filled 2022-10-26: qty 5

## 2022-11-06 MED FILL — Dexamethasone Sodium Phosphate Inj 100 MG/10ML: INTRAMUSCULAR | Qty: 1 | Status: AC

## 2022-11-07 ENCOUNTER — Inpatient Hospital Stay: Payer: Medicare Other

## 2022-11-07 ENCOUNTER — Inpatient Hospital Stay (HOSPITAL_BASED_OUTPATIENT_CLINIC_OR_DEPARTMENT_OTHER): Payer: Medicare Other | Admitting: Oncology

## 2022-11-07 ENCOUNTER — Encounter: Payer: Self-pay | Admitting: Oncology

## 2022-11-07 VITALS — BP 107/70 | HR 64 | Temp 97.2°F | Resp 20 | Wt 229.5 lb

## 2022-11-07 VITALS — BP 140/83 | HR 61 | Temp 97.4°F | Resp 19

## 2022-11-07 DIAGNOSIS — Z85038 Personal history of other malignant neoplasm of large intestine: Secondary | ICD-10-CM

## 2022-11-07 DIAGNOSIS — T451X5D Adverse effect of antineoplastic and immunosuppressive drugs, subsequent encounter: Secondary | ICD-10-CM

## 2022-11-07 DIAGNOSIS — Z5112 Encounter for antineoplastic immunotherapy: Secondary | ICD-10-CM | POA: Diagnosis not present

## 2022-11-07 DIAGNOSIS — C786 Secondary malignant neoplasm of retroperitoneum and peritoneum: Secondary | ICD-10-CM

## 2022-11-07 DIAGNOSIS — D6481 Anemia due to antineoplastic chemotherapy: Secondary | ICD-10-CM

## 2022-11-07 DIAGNOSIS — C184 Malignant neoplasm of transverse colon: Secondary | ICD-10-CM

## 2022-11-07 DIAGNOSIS — Z5111 Encounter for antineoplastic chemotherapy: Secondary | ICD-10-CM

## 2022-11-07 LAB — CBC WITH DIFFERENTIAL/PLATELET
Abs Immature Granulocytes: 0.02 10*3/uL (ref 0.00–0.07)
Basophils Absolute: 0 10*3/uL (ref 0.0–0.1)
Basophils Relative: 1 %
Eosinophils Absolute: 0.1 10*3/uL (ref 0.0–0.5)
Eosinophils Relative: 2 %
HCT: 33.7 % — ABNORMAL LOW (ref 39.0–52.0)
Hemoglobin: 11.2 g/dL — ABNORMAL LOW (ref 13.0–17.0)
Immature Granulocytes: 0 %
Lymphocytes Relative: 36 %
Lymphs Abs: 1.7 10*3/uL (ref 0.7–4.0)
MCH: 30.4 pg (ref 26.0–34.0)
MCHC: 33.2 g/dL (ref 30.0–36.0)
MCV: 91.6 fL (ref 80.0–100.0)
Monocytes Absolute: 0.4 10*3/uL (ref 0.1–1.0)
Monocytes Relative: 9 %
Neutro Abs: 2.5 10*3/uL (ref 1.7–7.7)
Neutrophils Relative %: 52 %
Platelets: 163 10*3/uL (ref 150–400)
RBC: 3.68 MIL/uL — ABNORMAL LOW (ref 4.22–5.81)
RDW: 15.2 % (ref 11.5–15.5)
WBC: 4.8 10*3/uL (ref 4.0–10.5)
nRBC: 0 % (ref 0.0–0.2)

## 2022-11-07 LAB — COMPREHENSIVE METABOLIC PANEL
ALT: 13 U/L (ref 0–44)
AST: 34 U/L (ref 15–41)
Albumin: 3.4 g/dL — ABNORMAL LOW (ref 3.5–5.0)
Alkaline Phosphatase: 39 U/L (ref 38–126)
Anion gap: 10 (ref 5–15)
BUN: 9 mg/dL (ref 8–23)
CO2: 23 mmol/L (ref 22–32)
Calcium: 8.9 mg/dL (ref 8.9–10.3)
Chloride: 103 mmol/L (ref 98–111)
Creatinine, Ser: 0.84 mg/dL (ref 0.61–1.24)
GFR, Estimated: >60 mL/min (ref >60)
Glucose, Bld: 139 mg/dL — ABNORMAL HIGH (ref 70–99)
Potassium: 3.5 mmol/L (ref 3.5–5.1)
Sodium: 136 mmol/L (ref 135–145)
Total Bilirubin: 1.5 mg/dL — ABNORMAL HIGH (ref 0.3–1.2)
Total Protein: 5.8 g/dL — ABNORMAL LOW (ref 6.5–8.1)

## 2022-11-07 LAB — PROTEIN, URINE, RANDOM: Total Protein, Urine: 70 mg/dL

## 2022-11-07 MED ORDER — SODIUM CHLORIDE 0.9 % IV SOLN
400.0000 mg/m2 | Freq: Once | INTRAVENOUS | Status: AC
  Administered 2022-11-07: 884 mg via INTRAVENOUS
  Filled 2022-11-07: qty 44.2

## 2022-11-07 MED ORDER — FLUOROURACIL CHEMO INJECTION 2.5 GM/50ML
400.0000 mg/m2 | Freq: Once | INTRAVENOUS | Status: AC
  Administered 2022-11-07: 900 mg via INTRAVENOUS
  Filled 2022-11-07: qty 18

## 2022-11-07 MED ORDER — SODIUM CHLORIDE 0.9 % IV SOLN
2400.0000 mg/m2 | INTRAVENOUS | Status: DC
  Administered 2022-11-07: 5000 mg via INTRAVENOUS
  Filled 2022-11-07: qty 100

## 2022-11-07 MED ORDER — SODIUM CHLORIDE 0.9 % IV SOLN
10.0000 mg | Freq: Once | INTRAVENOUS | Status: AC
  Administered 2022-11-07: 10 mg via INTRAVENOUS
  Filled 2022-11-07: qty 10

## 2022-11-07 MED ORDER — SODIUM CHLORIDE 0.9 % IV SOLN
5.0000 mg/kg | Freq: Once | INTRAVENOUS | Status: AC
  Administered 2022-11-07: 500 mg via INTRAVENOUS
  Filled 2022-11-07: qty 16

## 2022-11-07 MED ORDER — SODIUM CHLORIDE 0.9 % IV SOLN
Freq: Once | INTRAVENOUS | Status: AC
  Filled 2022-11-07: qty 250

## 2022-11-07 NOTE — Patient Instructions (Signed)
[place] [facility]  Discharge Instructions: Thank you for choosing [facility] to provide your oncology and hematology care.  If you have a lab appointment with the Cancer Center, please go directly to the Cancer Center and check in at the registration area.  Wear comfortable clothing and clothing appropriate for easy access to any Portacath or PICC line.   We strive to give you quality time with your provider. You may need to reschedule your appointment if you arrive late (15 or more minutes).  Arriving late affects you and other patients whose appointments are after yours.  Also, if you miss three or more appointments without notifying the office, you may be dismissed from the clinic at the provider's discretion.      For prescription refill requests, have your pharmacy contact our office and allow 72 hours for refills to be completed.    Today you received the following chemotherapy and/or immunotherapy agents bevacizumab, leucovorin, and adrucil      To help prevent nausea and vomiting after your treatment, we encourage you to take your nausea medication as directed.  BELOW ARE SYMPTOMS THAT SHOULD BE REPORTED IMMEDIATELY: *FEVER GREATER THAN 100.4 F (38 C) OR HIGHER *CHILLS OR SWEATING *NAUSEA AND VOMITING THAT IS NOT CONTROLLED WITH YOUR NAUSEA MEDICATION *UNUSUAL SHORTNESS OF BREATH *UNUSUAL BRUISING OR BLEEDING *URINARY PROBLEMS (pain or burning when urinating, or frequent urination) *BOWEL PROBLEMS (unusual diarrhea, constipation, pain near the anus) TENDERNESS IN MOUTH AND THROAT WITH OR WITHOUT PRESENCE OF ULCERS (sore throat, sores in mouth, or a toothache) UNUSUAL RASH, SWELLING OR PAIN  UNUSUAL VAGINAL DISCHARGE OR ITCHING   Items with * indicate a potential emergency and should be followed up as soon as possible or go to the Emergency Department if any problems should occur.  Please show the CHEMOTHERAPY ALERT CARD or IMMUNOTHERAPY  ALERT CARD at check-in to the Emergency Department and triage nurse.  Should you have questions after your visit or need to cancel or reschedule your appointment, please contact [place] [facility]  [phone] and follow the prompts.  Office hours are 8:00 a.m. to 4:30 p.m. Monday - Friday. Please note that voicemails left after 4:00 p.m. may not be returned until the following business day.  We are closed weekends and major holidays. You have access to a nurse at all times for urgent questions. Please call the main number to the clinic [phone] and follow the prompts.  For any non-urgent questions, you may also contact your provider using MyChart. We now offer e-Visits for anyone 40 and older to request care online for non-urgent symptoms. For details visit [email or web address].   Also download the MyChart app! Go to the app store, search "MyChart", open the app, select [place], and log in with your MyChart username and password.

## 2022-11-07 NOTE — Assessment & Plan Note (Signed)
Chemotherapy plan as listed above 

## 2022-11-07 NOTE — Assessment & Plan Note (Signed)
Mild anemia. continue to monitor.  Hemoglobin is close to baseline. 

## 2022-11-07 NOTE — Progress Notes (Signed)
Hematology/Oncology Progress note Telephone:(336) [phone] Fax:(336) [phone]    Chief Complaint: Jared Gutierrez is a 82 y.o. male presents for follow-up of metastatic colon cancer   ASSESSMENT & PLAN:   Cancer Staging  Cancer of transverse colon [facility]) Staging form: Colon and Rectum, [name], [name], [name], MD on 10/26/2021   Cancer of transverse colon (HCC) Overall he tolerates chemotherapy.   CEA is relatively stable with small fluctuations. CT images shows slight lung progression.he is asymptomatic. I recommend continue current regimen and close monitor on surveillance.  Labs reviewed and discussed with patient Proceed with 5-FU/bevacizumab treatment today Continue current regimen.   Anemia due to chemotherapy Mild anemia. continue to monitor.  Hemoglobin is close to baseline.  Encounter for antineoplastic chemotherapy Chemotherapy plan as listed above.   Follow-up 2 weeks lab MD 5-FU/bevacizumab -   All questions were answered. The patient knows to call the clinic with any problems, questions or concerns.  [name], MD, PhD [facility] Hematology Oncology 11/07/2022     PERTINENT ONCOLOGY HISTORY Jared Gutierrez is a 82 y.o.[place] who has above oncology history reviewed by me today presented for follow up visit for management of Metastatic colon cancer. [name], patient switched care to me on 11/11/20 Extensive medical record review was performed by me  Oncology History  History of colon cancer, stage I  10/02/2014 Pathology Results   [id number] colonoscopy pathology A. CECAL POLYPS; HOT SNARE AND COLD BIOPSY:  - TUBULAR ADENOMA, MULTIPLE FRAGMENTS.  - NEGATIVE FOR HIGH-GRADE DYSPLASIA AND MALIGNANCY.   B. COLON POLYP, TRANSVERSE; COLD BIOPSY:  - TUBULAR ADENOMA.  - NEGATIVE FOR HIGH-GRADE DYSPLASIA AND MALIGNANCY.     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  Tumor extended into the submucosa. Margins were negative. + lymphovascular invasion. 14 lymph nodes were negative. Pathologic stage was T1 N0.  TRANSVERSE COLON, RESECTION:  - INVASIVE ADENOCARCINOMA ARISING IN A 4.6 CM TUBULOVILLOUS  ADENOMA WITH HIGH GRADE DYSPLASIA (MALIGNANT POLYP).  - SEE SUMMARY BELOW.  [name]  ONCOLOGY SUMMARY: COLON AND RECTUM, RESECTION, AJCC 7TH EDITION  Specimen: transverse colon  Procedure: transverse colon resection  Tumor site: splenic flexure  Tumor size: 1.0 cm (invasive component)  Macroscopic tumor perforation: not specified  Histologic type: invasive adenocarcinoma  Histologic grade: moderately differentiated  Microscopic tumor extension: into submucosa  Margins:       Proximal margin: negative       Distal margin: negative       Circumferential (radial) or mesenteric margin: negative       If all margins uninvolved by invasive carcinoma:  Distance of invasive carcinoma from closest margin: 7.5 cm  to distal margin  Treatment effect: not applicable  Lymph-vascular invasion: present  Perineural invasion: not identified  Tumor deposits (discontinuous extramural extension): not  identified   Pathologic staging:       Primary tumor: pT1       Regional lymph nodes: pN0            Number of nodes examined: 14            Number of nodes involved: 0    04/02/2019 Progression   04/02/2019  PET scan  limited evealed a 2.4 x 2.3 cm (SUV 11) hypermetabolic soft tissue density caudal and anterior to the pancreatic neck favored to represent isolated peritoneal or nodal metastasis in the setting of prior transverse colonic resection (expected primary drainage). Although this was immediately adjacent to the pancreas, a fat plane was maintained, arguing strongly against a pancreatic primary. Otherwise, there was no evidence of hypermetabolic metastasis. 04/17/2019 EUS on  revealed a normal esophagus, stomach, duodenum, and pancreas.  There was a 2.4 x 2.4 cm irregular mass in the retroperitoneum adjacent to, but not involving the pancreatic neck.  FNA and core needle biopsy were performed.  Pathology revealed adenocarcinoma compatible with a metastatic lesion of colorectal origin.  Tumor cells were positive for CK20 and CDX2 and negative for CK7.  NGS: [employer] on 05/15/2019 revealed + [id number] and TP53.  Negative results included BRAF V600E, Her2, NRAS, NTRK, PD-L1 (<1%), and TMB 8.7/Mb (intermediate).  MMR testing from his colon resection on 10/16/2013 was intact with a low probability of MSI-H.   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  He received Neulasta after cycle #4 and #5 secondary to progressive leukopenia.  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  He received a truncated course of FOLFOX with cycle #11 secondary to oxaliplatin reaction.   01/14/2020 Imaging    PET revealed an interval decrease in size and FDG uptake (2.4 x 2.3 cm with SUV 11 to 2.4 x 1.7 cm with SUV 4.09) associated with the previously referenced soft tissue  density caudal and anterior to the pancreatic neck suggesting treatment response. There were no new sites of FDG avid tumor.   08/31/2020 Imaging   08/31/2020 Abdomen and pelvis CT revealed increased size of the index soft tissue lesion inferior and anterior to the pancreatic neck (2.9 x 1.8 cm to 3.5 x 2.9 cm). There were similar prominent retroperitoneal lymph nodes without adenopathy by size criteria. There was no new or enlarging abdominal or pelvic lymph nodes. There were no new interval findings. There was similar circumferential wall thickening of a nondistended urinary bladder, which likely accentuated wall thickening There was hepatic steatosis and aortic atherosclerosis.   11/03/2020, PET showed recurrent peritoneal metastasis in the upper abdomen adjacent to the pancreas.  The lesion is 3.8 x 2.9 cm with SUV of 11.3. No evidence of metastatic peritoneal disease elsewhere in the abdomen pelvis.  No evidence of distant metastasis.   11/03/2020 Imaging   PET scan showed recurrent peritoneal metastasis near pancreas. Given that he has no other distant metastasis. Discussed with radiation oncology. Repeat SBRT may be considered if he does not respond well to systemic chemotherapy.     11/26/2020 -  Chemotherapy   5-FU and bevacizumab.   02/17/2021 Imaging  02/17/2021 CT abdomen pelvis showed partial response. Mild decrease of peritoneal lesion.  Proceed with 5-FU and bevacizumab today.  Given that he is tolerating current regimen with good life quality, partial response.  Shared decision was made to hold off adding additional chemotherapy agents.   06/08/2021, CT chest abdomen pelvis with contrast showed soft tissue mass at the base of mesentery inferior to the pancreatic neck is unchanged in size.  No progressive disease was identified.   09/07/2021, CT chest abdomen pelvis with contrast showed no substantial changes in size of the mesenteric mass, 2.6 cm.  No suspicious new lesions.  Chronic  findings as detailed in the imaging report.   12/05/2021 Imaging   PET scan showed soft tissue mesenteric mass is unchanged in size.  Decreased FDG uptake compared to activity on November 03, 2020.  Fever treated disease.  No new metastatic disease.   03/21/2022 Imaging   CT chest abdomen pelvis  1. Similar size of the peripancreatic presumed peritoneal implant compared to 09/07/2021. 2. No new or progressive disease. 3. Incidental findings, including: Coronary artery atherosclerosis.Aortic Atherosclerosis (ICD10-I70.0). Prostatomegaly with chronic bladder wall thickening, suggesting outlet obstruction.     07/11/2022 Imaging   CT chest abdomen pelvis with contrast showed 1. New 6 mm nodule in the right upper lobe and 2 mm nodule in the left upper lobe. Although small these are concerning. Recommend short follow-up in 3 months 2. Stable low-attenuation lesion along the midbody of the pancreas. Continued attention on follow-up. No new peritoneal mass lesions or ascites. 3. Fatty liver infiltration 4. Colonic surgical changes   10/03/2022 Imaging   CT chest abdomen pelvis w contrast showed 1. Slight interval enlargement and cavitation of a nodule in the right upper lobe, measuring 0.6 cm, previously 0.5 cm. Slight interval enlargement of a left upper lobe nodule measuring 0.3 cm,previously 0.2 cm. Findings are concerning for slowly enlarging small pulmonary metastases. 2. No significant change in a lobulated soft tissue mass within the central small bowel mesentery abutting the inferior aspect of the pancreas, consistent with a stable, treated metastasis. 3. No other evidence of lymphadenopathy or metastatic disease in the chest, abdomen, or pelvis. 4. Prostatomegaly with diffuse wall thickening of the relatively decompressed urinary bladder, likely secondary to chronic outlet obstruction. 5. Unchanged fibrotic scarring and bronchiectasis of the bilateral lung bases. This could be further assessed by  pulmonary referral and dedicated interstitial lung disease protocol CT examination of the chest if and when clinically appropriate in the setting of known metastatic malignancy. Aortic Atherosclerosis     Other medical problems Chronic lower extremity edema. 12/24/2018, right lower extremity duplex negative for DVT.  Small right Baker's cyst. 08/16/2019, bilateral lower extremity duplex showed no DVT. 08/16/2019 - 08/17/2019 with right lower extremity cellulitis.  He was unable to bear weight.  He was treated with IV fluids, NSAIDs, colchicine, and broad antibiotics (vancomycin and Cefepime).  He was discharged on indomethacin x 5 days and Keflex 500 mg TID x 5 days.  10/05/2020, colonoscopy showed internal hemorrhoids.  Otherwise normal examination.  INTERVAL HISTORY Jared Gutierrez is a 82 y.o. male who has above history reviewed by me today presents for follow up visit for management of recurrent metastatic colon cancer Patient was accompanied by daughter Denies fever, chills, nausea, vomiting, diarrhea, chest pain, shortness of breath, abdominal pain,    Review of Systems  Constitutional:  Negative for appetite change, chills, diaphoresis, fever and unexpected weight change.  HENT:   Negative for hearing  loss, nosebleeds, sore throat and tinnitus.   Respiratory:  Negative for cough, hemoptysis and shortness of breath.   Cardiovascular:  Negative for chest pain and palpitations.  Gastrointestinal:  Negative for abdominal pain, blood in stool, constipation, diarrhea, nausea and vomiting.  Genitourinary:  Negative for dysuria, frequency and hematuria.   Musculoskeletal:  Positive for arthralgias. Negative for back pain, myalgias and neck pain.  Skin:  Negative for itching and rash.  Neurological:  Positive for numbness. Negative for dizziness and headaches.  Hematological:  Does not bruise/bleed easily.  Psychiatric/Behavioral:  Negative for depression. The patient is not nervous/anxious.        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   Chicken pox    Colon cancer (HCC)    History of kidney stones    Hypertension    Lipoma of colon    Nephrolithiasis    Nephrolithiasis    Obesity    Shingles    Tubular adenoma of colon    multiple fragments    Past Surgical History:  Procedure Laterality Date   COLON SURGERY     COLONOSCOPY N/A 10/02/2014   Procedure: COLONOSCOPY;  Surgeon: Elnita Maxwell, MD;  [facility] END[facility]COPY;  Service: Endoscopy;  Laterality: N/A;   COLONOSCOPY WITH PROPOFOL N/A 02/16/2017   Procedure: COLONOSCOPY WITH PROPOFOL;  Surgeon: Scot Jun, MD;  Location: [facility] ENDOSCOPY;  Service: Endoscopy;  Laterality: N/A;   COLONOSCOPY WITH PROPOFOL N/A 10/05/2020   Procedure: COLONOSCOPY WITH PROPOFOL;  Surgeon: Regis Bill, MD;  Location: [facility] ENDOSCOPY;  Service: Endoscopy;  Laterality: N/A;   EUS N/A 04/17/2019   Procedure: FULL UPPER ENDOSCOPIC ULTRASOUND (EUS) RADIAL;  Surgeon: Bearl Mulberry, MD;  Location: [facility] ENDOSCOPY;  Service: Gastroenterology;  Laterality: N/A;   KIDNEY STONE SURGERY     PARTIAL COLECTOMY  10/17/2013   PORTACATH PLACEMENT Right 06/13/2019   Procedure: INSERTION PORT-A-CATH;  Surgeon: Sung Amabile, DO;  Location: [facility] ORS;  Service: General;  Laterality: Right;    Family History  Problem Relation Age of Onset   Cancer Mother    Breast cancer Mother    COPD Father     Social History:  reports that he has never smoked. He has never used smokeless tobacco. He reports current alcohol use. He reports that he does not use drugs.    Allergies: No Known Allergies  Current Medications: Current Outpatient Medications  Medication Sig Dispense Refill   acetaminophen (TYLENOL) 500 MG tablet Take 500 mg by mouth every 6 (six) hours as needed.     amLODipine (NORVASC) 2.5 MG tablet Take 2.5 mg by mouth daily.      aspirin EC 81 MG tablet Take 81 mg by mouth daily.     Calcium Carbonate (CALCIUM 600 PO) Take 1 tablet by mouth 2 (two) times daily.     Cholecalciferol (VITAMIN D) 125 MCG (5000 UT) CAPS Take 5,000 mg by mouth.     docusate sodium (COLACE) 100 MG capsule Take 1 capsule (100 mg total) by mouth 2 (two) times daily as needed for mild constipation or moderate constipation. 60 capsule 3   HYDROcodone-acetaminophen (NORCO/VICODIN) 5-325 MG tablet Take 1 tablet by mouth every 6 (six) hours as needed for moderate pain (up to 3 doses for moderate pain.).     lidocaine-prilocaine (EMLA) cream APPLY TO AFFECTED AREA ONCE AS DIRECTED 30  g 3   loperamide (IMODIUM) 2 MG capsule Take 1 capsule (2 mg total) by mouth See admin instructions. Take 2 tablets after first loose stool,  then 1 tablet  after each loose stool; maximum: 8 tablets /day 60 capsule 0   loratadine (CLARITIN) 10 MG tablet Take 10 mg by mouth daily.     olmesartan (BENICAR) 20 MG tablet Take 1 tablet by mouth daily.     ondansetron (ZOFRAN) 8 MG tablet Take 1 tablet (8 mg total) by mouth 2 (two) times daily as needed for refractory nausea / vomiting. Start on day 3 after chemotherapy. 60 tablet 1   potassium chloride SA (KLOR-CON M20) 20 MEQ tablet Take 1 tablet (20 mEq total) by mouth daily. 180 tablet 1   Probiotic Product (PROBIOTIC DAILY PO) Take 1 tablet by mouth daily.     prochlorperazine (COMPAZINE) 10 MG tablet Take 1 tablet (10 mg total) by mouth every 6 (six) hours as needed (NAUSEA). 30 tablet 1   senna-docusate (SENNA S) 8.6-50 MG tablet Take 2 tablets by mouth daily. 60 tablet 3   No current facility-administered medications for this visit.   Facility-Administered Medications Ordered in Other Visits  Medication Dose Route Frequency Provider Last Rate Last Admin   0.9 %  sodium chloride infusion   Intravenous Once Corcoran, Melissa C, MD       0.9 %  sodium chloride infusion   Intravenous Continuous Nelva Nay C, MD 10  mL/hr at 12/31/19 1000 New Bag at 10/05/20 1045   fluorouracil (ADRUCIL) 5,000 mg in sodium chloride 0.9 % 150 mL chemo infusion  2,400 mg/m2 (Treatment Plan Recorded) Intravenous 1 day or 1 dose [name], MD   Infusion Verify at 11/07/22 1048   heparin lock flush 100 unit/mL  500 Units Intravenous Once Rosey Bath, MD        Performance status (ECOG): 1  Vitals Blood pressure 107/70, pulse 64, temperature (!) 97.2 F (36.2 C), resp. rate 20, weight 229 lb 8 oz (104.1 kg), SpO2 100 %.   Physical Exam Constitutional:      General: He is not in acute distress.    Appearance: He is obese. He is not diaphoretic.  HENT:     Head: Normocephalic and atraumatic.  Eyes:     General: No scleral icterus.    Pupils: Pupils are equal, round, and reactive to light.  Cardiovascular:     Rate and Rhythm: Normal rate and regular rhythm.     Heart sounds: No murmur heard. Pulmonary:     Effort: Pulmonary effort is normal. No respiratory distress.     Breath sounds: No wheezing.     Comments: Decreased breath sound bilaterally.  Abdominal:     General: There is no distension.     Palpations: Abdomen is soft.     Tenderness: There is no abdominal tenderness.  Musculoskeletal:        General: Normal range of motion.     Cervical back: Normal range of motion and neck supple.     Comments: Trace edema bilaterally  Skin:    General: Skin is warm and dry.     Findings: No erythema.  Neurological:     Mental Status: He is alert and oriented to person, place, and time. Mental status is at baseline.     Cranial Nerves: No cranial nerve deficit.     Motor: No abnormal muscle tone.  Psychiatric:        Mood and Affect:  Mood and affect normal.     Labs were reviewed by me.     Latest Ref Rng & Units 11/07/2022    8:06 AM 10/24/2022    8:43 AM 10/10/2022    8:47 AM  CBC  WBC 4.0 - 10.5 K/uL 4.8  4.8  4.5   Hemoglobin 13.0 - 17.0 g/dL 16.1  09.6  04.5   Hematocrit 39.0 - 52.0 % 33.7  34.1   34.1   Platelets 150 - 400 K/uL 163  150  172       Latest Ref Rng & Units 11/07/2022    8:06 AM 10/24/2022    8:43 AM 10/10/2022    8:47 AM  CMP  Glucose 70 - 99 mg/dL 409  811  914   BUN 8 - 23 mg/dL 9  10  10    Creatinine 0.61 - 1.24 mg/dL 7.82  9.56  2.13   Sodium 135 - 145 mmol/L 136  133  132   Potassium 3.5 - 5.1 mmol/L 3.5  3.5  3.5   Chloride 98 - 111 mmol/L 103  103  100   CO2 22 - 32 mmol/L 23  24  22    Calcium 8.9 - 10.3 mg/dL 8.9  9.4  9.0   Total Protein 6.5 - 8.1 g/dL 5.8  6.1  6.2   Total Bilirubin 0.3 - 1.2 mg/dL 1.5  1.0  0.9   Alkaline Phos 38 - 126 U/L 39  39  42   AST 15 - 41 U/L 34  27  36   ALT 0 - 44 U/L 13  13  12

## 2022-11-07 NOTE — Assessment & Plan Note (Signed)
Overall he tolerates chemotherapy.   CEA is relatively stable with small fluctuations. CT images shows slight lung progression.he is asymptomatic. I recommend continue current regimen and close monitor on surveillance.  Labs reviewed and discussed with patient Proceed with 5-FU/bevacizumab treatment today Continue current regimen.  

## 2022-11-09 ENCOUNTER — Inpatient Hospital Stay: Payer: Medicare Other

## 2022-11-09 VITALS — BP 149/82 | HR 54 | Resp 18

## 2022-11-09 DIAGNOSIS — Z85038 Personal history of other malignant neoplasm of large intestine: Secondary | ICD-10-CM

## 2022-11-09 DIAGNOSIS — C786 Secondary malignant neoplasm of retroperitoneum and peritoneum: Secondary | ICD-10-CM

## 2022-11-09 DIAGNOSIS — Z5112 Encounter for antineoplastic immunotherapy: Secondary | ICD-10-CM | POA: Diagnosis not present

## 2022-11-09 MED ORDER — SODIUM CHLORIDE 0.9% FLUSH
10.0000 mL | INTRAVENOUS | Status: DC | PRN
  Administered 2022-11-09: 10 mL
  Filled 2022-11-09: qty 10

## 2022-11-09 MED ORDER — HEPARIN SOD (PORK) LOCK FLUSH 100 UNIT/ML IV SOLN
500.0000 [IU] | Freq: Once | INTRAVENOUS | Status: AC | PRN
  Administered 2022-11-09: 500 [IU]
  Filled 2022-11-09: qty 5

## 2022-11-21 ENCOUNTER — Inpatient Hospital Stay: Payer: Medicare Other | Attending: Oncology

## 2022-11-21 ENCOUNTER — Encounter: Payer: Self-pay | Admitting: Oncology

## 2022-11-21 ENCOUNTER — Inpatient Hospital Stay: Payer: Medicare Other

## 2022-11-21 ENCOUNTER — Inpatient Hospital Stay (HOSPITAL_BASED_OUTPATIENT_CLINIC_OR_DEPARTMENT_OTHER): Payer: Medicare Other | Admitting: Oncology

## 2022-11-21 VITALS — BP 152/77 | HR 57

## 2022-11-21 VITALS — BP 134/78 | HR 64 | Temp 97.1°F | Resp 18 | Wt 230.7 lb

## 2022-11-21 DIAGNOSIS — K648 Other hemorrhoids: Secondary | ICD-10-CM | POA: Diagnosis not present

## 2022-11-21 DIAGNOSIS — D6481 Anemia due to antineoplastic chemotherapy: Secondary | ICD-10-CM | POA: Diagnosis not present

## 2022-11-21 DIAGNOSIS — Z86010 Personal history of colonic polyps: Secondary | ICD-10-CM | POA: Diagnosis not present

## 2022-11-21 DIAGNOSIS — T451X5A Adverse effect of antineoplastic and immunosuppressive drugs, initial encounter: Secondary | ICD-10-CM

## 2022-11-21 DIAGNOSIS — C184 Malignant neoplasm of transverse colon: Secondary | ICD-10-CM

## 2022-11-21 DIAGNOSIS — T451X5D Adverse effect of antineoplastic and immunosuppressive drugs, subsequent encounter: Secondary | ICD-10-CM | POA: Diagnosis not present

## 2022-11-21 DIAGNOSIS — Z85038 Personal history of other malignant neoplasm of large intestine: Secondary | ICD-10-CM | POA: Diagnosis not present

## 2022-11-21 DIAGNOSIS — Z5112 Encounter for antineoplastic immunotherapy: Secondary | ICD-10-CM | POA: Insufficient documentation

## 2022-11-21 DIAGNOSIS — Z5111 Encounter for antineoplastic chemotherapy: Secondary | ICD-10-CM | POA: Diagnosis present

## 2022-11-21 DIAGNOSIS — Z836 Family history of other diseases of the respiratory system: Secondary | ICD-10-CM | POA: Insufficient documentation

## 2022-11-21 DIAGNOSIS — Z79899 Other long term (current) drug therapy: Secondary | ICD-10-CM | POA: Diagnosis not present

## 2022-11-21 DIAGNOSIS — Z809 Family history of malignant neoplasm, unspecified: Secondary | ICD-10-CM | POA: Insufficient documentation

## 2022-11-21 DIAGNOSIS — C786 Secondary malignant neoplasm of retroperitoneum and peritoneum: Secondary | ICD-10-CM | POA: Insufficient documentation

## 2022-11-21 DIAGNOSIS — Z803 Family history of malignant neoplasm of breast: Secondary | ICD-10-CM | POA: Insufficient documentation

## 2022-11-21 DIAGNOSIS — Z452 Encounter for adjustment and management of vascular access device: Secondary | ICD-10-CM | POA: Insufficient documentation

## 2022-11-21 DIAGNOSIS — Z7982 Long term (current) use of aspirin: Secondary | ICD-10-CM | POA: Diagnosis not present

## 2022-11-21 LAB — CBC WITH DIFFERENTIAL/PLATELET
Abs Immature Granulocytes: 0.02 10*3/uL (ref 0.00–0.07)
Basophils Absolute: 0 10*3/uL (ref 0.0–0.1)
Basophils Relative: 1 %
Eosinophils Absolute: 0.1 10*3/uL (ref 0.0–0.5)
Eosinophils Relative: 2 %
HCT: 34 % — ABNORMAL LOW (ref 39.0–52.0)
Hemoglobin: 11.2 g/dL — ABNORMAL LOW (ref 13.0–17.0)
Immature Granulocytes: 0 %
Lymphocytes Relative: 34 %
Lymphs Abs: 1.7 10*3/uL (ref 0.7–4.0)
MCH: 30.4 pg (ref 26.0–34.0)
MCHC: 32.9 g/dL (ref 30.0–36.0)
MCV: 92.1 fL (ref 80.0–100.0)
Monocytes Absolute: 0.4 10*3/uL (ref 0.1–1.0)
Monocytes Relative: 9 %
Neutro Abs: 2.6 10*3/uL (ref 1.7–7.7)
Neutrophils Relative %: 54 %
Platelets: 159 10*3/uL (ref 150–400)
RBC: 3.69 MIL/uL — ABNORMAL LOW (ref 4.22–5.81)
RDW: 15.3 % (ref 11.5–15.5)
WBC: 4.9 10*3/uL (ref 4.0–10.5)
nRBC: 0 % (ref 0.0–0.2)

## 2022-11-21 LAB — COMPREHENSIVE METABOLIC PANEL
ALT: 14 U/L (ref 0–44)
AST: 27 U/L (ref 15–41)
Albumin: 3.3 g/dL — ABNORMAL LOW (ref 3.5–5.0)
Alkaline Phosphatase: 39 U/L (ref 38–126)
Anion gap: 7 (ref 5–15)
BUN: 9 mg/dL (ref 8–23)
CO2: 25 mmol/L (ref 22–32)
Calcium: 9 mg/dL (ref 8.9–10.3)
Chloride: 103 mmol/L (ref 98–111)
Creatinine, Ser: 0.51 mg/dL — ABNORMAL LOW (ref 0.61–1.24)
GFR, Estimated: >60 mL/min (ref >60)
Glucose, Bld: 114 mg/dL — ABNORMAL HIGH (ref 70–99)
Potassium: 3.5 mmol/L (ref 3.5–5.1)
Sodium: 135 mmol/L (ref 135–145)
Total Bilirubin: 0.9 mg/dL (ref 0.3–1.2)
Total Protein: 5.9 g/dL — ABNORMAL LOW (ref 6.5–8.1)

## 2022-11-21 LAB — PROTEIN, URINE, RANDOM: Total Protein, Urine: 60 mg/dL

## 2022-11-21 MED ORDER — SODIUM CHLORIDE 0.9 % IV SOLN
2400.0000 mg/m2 | INTRAVENOUS | Status: DC
  Administered 2022-11-21: 5000 mg via INTRAVENOUS
  Filled 2022-11-21: qty 100

## 2022-11-21 MED ORDER — SODIUM CHLORIDE 0.9 % IV SOLN
Freq: Once | INTRAVENOUS | Status: AC
  Filled 2022-11-21: qty 250

## 2022-11-21 MED ORDER — SODIUM CHLORIDE 0.9 % IV SOLN
400.0000 mg/m2 | Freq: Once | INTRAVENOUS | Status: DC
  Filled 2022-11-21: qty 44.2

## 2022-11-21 MED ORDER — SODIUM CHLORIDE 0.9 % IV SOLN
10.0000 mg | Freq: Once | INTRAVENOUS | Status: AC
  Administered 2022-11-21: 10 mg via INTRAVENOUS
  Filled 2022-11-21: qty 10

## 2022-11-21 MED ORDER — SODIUM CHLORIDE 0.9 % IV SOLN
400.0000 mg/m2 | Freq: Once | INTRAVENOUS | Status: AC
  Administered 2022-11-21: 884 mg via INTRAVENOUS
  Filled 2022-11-21: qty 44.2

## 2022-11-21 MED ORDER — SODIUM CHLORIDE 0.9 % IV SOLN
5.0000 mg/kg | Freq: Once | INTRAVENOUS | Status: AC
  Administered 2022-11-21: 500 mg via INTRAVENOUS
  Filled 2022-11-21: qty 20

## 2022-11-21 MED ORDER — FLUOROURACIL CHEMO INJECTION 2.5 GM/50ML
400.0000 mg/m2 | Freq: Once | INTRAVENOUS | Status: AC
  Administered 2022-11-21: 900 mg via INTRAVENOUS
  Filled 2022-11-21: qty 18

## 2022-11-21 NOTE — Patient Instructions (Signed)
[facility] [place] [facility]  Discharge Instructions: Thank you for choosing [facility] to provide your oncology and hematology care.  If you have a lab appointment with the Cancer Center, please go directly to the Cancer Center and check in at the registration area.  Wear comfortable clothing and clothing appropriate for easy access to any Portacath or PICC line.   We strive to give you quality time with your provider. You may need to reschedule your appointment if you arrive late (15 or more minutes).  Arriving late affects you and other patients whose appointments are after yours.  Also, if you miss three or more appointments without notifying the office, you may be dismissed from the clinic at the provider's discretion.      For prescription refill requests, have your pharmacy contact our office and allow 72 hours for refills to be completed.    Today you received the following chemotherapy and/or immunotherapy agents- bevacizumab, leucovorin, 5FU      To help prevent nausea and vomiting after your treatment, we encourage you to take your nausea medication as directed.  BELOW ARE SYMPTOMS THAT SHOULD BE REPORTED IMMEDIATELY: *FEVER GREATER THAN 100.4 F (38 C) OR HIGHER *CHILLS OR SWEATING *NAUSEA AND VOMITING THAT IS NOT CONTROLLED WITH YOUR NAUSEA MEDICATION *UNUSUAL SHORTNESS OF BREATH *UNUSUAL BRUISING OR BLEEDING *URINARY PROBLEMS (pain or burning when urinating, or frequent urination) *BOWEL PROBLEMS (unusual diarrhea, constipation, pain near the anus) TENDERNESS IN MOUTH AND THROAT WITH OR WITHOUT PRESENCE OF ULCERS (sore throat, sores in mouth, or a toothache) UNUSUAL RASH, SWELLING OR PAIN  UNUSUAL VAGINAL DISCHARGE OR ITCHING   Items with * indicate a potential emergency and should be followed up as soon as possible or go to the Emergency Department if any problems should occur.  Please show the CHEMOTHERAPY ALERT CARD or IMMUNOTHERAPY ALERT  CARD at check-in to the Emergency Department and triage nurse.  Should you have questions after your visit or need to cancel or reschedule your appointment, please contact [facility] [place] [facility]  [phone] and follow the prompts.  Office hours are 8:00 a.m. to 4:30 p.m. Monday - Friday. Please note that voicemails left after 4:00 p.m. may not be returned until the following business day.  We are closed weekends and major holidays. You have access to a nurse at all times for urgent questions. Please call the main number to the clinic [phone] and follow the prompts.  For any non-urgent questions, you may also contact your provider using MyChart. We now offer e-Visits for anyone 18 and older to request care online for non-urgent symptoms. For details visit [email or web address].   Also download the MyChart app! Go to the app store, search "MyChart", open the app, select [facility], and log in with your MyChart username and password.    

## 2022-11-21 NOTE — Assessment & Plan Note (Signed)
Chemotherapy plan as listed above 

## 2022-11-21 NOTE — Assessment & Plan Note (Addendum)
Overall he tolerates chemotherapy.   CEA is relatively stable with small fluctuations. CT images shows slight lung progression.he is asymptomatic. I recommend continue current regimen and close monitor on surveillance.  Labs reviewed and discussed with patient Proceed with 5-FU/bevacizumab treatment today Continue current regimen.  

## 2022-11-21 NOTE — Assessment & Plan Note (Signed)
Mild anemia. continue to monitor.  Hemoglobin is close to baseline. 

## 2022-11-21 NOTE — Progress Notes (Signed)
Hematology/Oncology Progress note [phone] [phone]    Chief Complaint: Jared Gutierrez is a 82 y.o.  male presents for follow-up of metastatic colon cancer   ASSESSMENT & PLAN:   Cancer Staging  Cancer of transverse colon [facility]) Staging form: Colon and Rectum, [name], [name], cM1) - Signed by Jared Patience, MD on 10/26/2021   Cancer of transverse colon (HCC) Overall he tolerates chemotherapy.   CEA is relatively stable with small fluctuations. CT images shows slight lung progression.he is asymptomatic. I recommend continue current regimen and close monitor on surveillance.  Labs reviewed and discussed with patient Proceed with 5-FU/bevacizumab treatment today Continue current regimen.  Encounter for antineoplastic chemotherapy Chemotherapy plan as listed above.   Anemia due to chemotherapy Mild anemia. continue to monitor.  Hemoglobin is close to baseline.  Follow-up 2 weeks lab MD 5-FU/bevacizumab -   All questions were answered. The patient knows to call the clinic with any problems, questions or concerns.  Jared Patience, MD, PhD [facility] Hematology Oncology 11/21/2022     PERTINENT ONCOLOGY HISTORY Jared Gutierrez is a 82 y.o.[place] who has above oncology history reviewed by me today presented for follow up visit for management of Metastatic colon cancer. [name], patient switched care to me on 11/11/20 Extensive medical record review was performed by me  Oncology History  History of colon cancer, stage I  10/02/2014 Pathology Results   [id number] colonoscopy pathology A. CECAL POLYPS; HOT SNARE AND COLD BIOPSY:  - TUBULAR ADENOMA, MULTIPLE FRAGMENTS.  - NEGATIVE FOR HIGH-GRADE DYSPLASIA AND MALIGNANCY.   B. COLON POLYP, TRANSVERSE; COLD BIOPSY:  - TUBULAR ADENOMA.  - NEGATIVE FOR HIGH-GRADE DYSPLASIA AND MALIGNANCY.     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  Tumor extended into the submucosa. Margins were negative. + lymphovascular invasion. 14 lymph nodes were negative. Pathologic stage was T1 N0.  TRANSVERSE COLON, RESECTION:  - INVASIVE ADENOCARCINOMA ARISING IN A 4.6 CM TUBULOVILLOUS  ADENOMA WITH HIGH GRADE DYSPLASIA (MALIGNANT POLYP).  - SEE SUMMARY BELOW.  [name]  ONCOLOGY SUMMARY: COLON AND RECTUM, RESECTION, AJCC 7TH EDITION  Specimen: transverse colon  Procedure: transverse colon resection  Tumor site: splenic flexure  Tumor size: 1.0 cm (invasive component)  Macroscopic tumor perforation: not specified  Histologic type: invasive adenocarcinoma  Histologic grade: moderately differentiated  Microscopic tumor extension: into submucosa  Margins:       Proximal margin: negative       Distal margin: negative       Circumferential (radial) or mesenteric margin: negative       If all margins uninvolved by invasive carcinoma:  Distance of invasive carcinoma from closest margin: 7.5 cm  to distal margin  Treatment effect: not applicable  Lymph-vascular invasion: present  Perineural invasion: not identified  Tumor deposits (discontinuous extramural extension): not  identified   Pathologic staging:       Primary tumor: pT1       Regional lymph nodes: pN0            Number of nodes examined: 14            Number of nodes involved: 0    04/02/2019 Progression   04/02/2019  PET scan  limited evealed a 2.4 x 2.3 cm (SUV 11) hypermetabolic soft tissue density caudal and anterior to the pancreatic neck favored to represent isolated peritoneal or nodal metastasis in the setting of prior transverse colonic resection (expected primary drainage). Although this was immediately adjacent to the pancreas, a fat plane was maintained, arguing strongly against a pancreatic primary. Otherwise, there was no evidence of hypermetabolic metastasis. 04/17/2019 EUS on  revealed a normal esophagus, stomach, duodenum, and pancreas.  There was a 2.4 x 2.4 cm irregular mass in the retroperitoneum adjacent to, but not involving the pancreatic neck.  FNA and core needle biopsy were performed.  Pathology revealed adenocarcinoma compatible with a metastatic lesion of colorectal origin.  Tumor cells were positive for CK20 and CDX2 and negative for CK7.  NGS: [employer] on 05/15/2019 revealed + [id number] and TP53.  Negative results included BRAF V600E, Her2, NRAS, NTRK, PD-L1 (<1%), and TMB 8.7/Mb (intermediate).  MMR testing from his colon resection on 10/16/2013 was intact with a low probability of MSI-H.   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  He received Neulasta after cycle #4 and #5 secondary to progressive leukopenia.  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  He received a truncated course of FOLFOX with cycle #11 secondary to oxaliplatin reaction.   01/14/2020 Imaging    PET revealed an interval decrease in size and FDG uptake (2.4 x 2.3 cm with SUV 11 to 2.4 x 1.7 cm with SUV 4.09) associated with the previously referenced soft tissue  density caudal and anterior to the pancreatic neck suggesting treatment response. There were no new sites of FDG avid tumor.   08/31/2020 Imaging   08/31/2020 Abdomen and pelvis CT revealed increased size of the index soft tissue lesion inferior and anterior to the pancreatic neck (2.9 x 1.8 cm to 3.5 x 2.9 cm). There were similar prominent retroperitoneal lymph nodes without adenopathy by size criteria. There was no new or enlarging abdominal or pelvic lymph nodes. There were no new interval findings. There was similar circumferential wall thickening of a nondistended urinary bladder, which likely accentuated wall thickening There was hepatic steatosis and aortic atherosclerosis.   11/03/2020, PET showed recurrent peritoneal metastasis in the upper abdomen adjacent to the pancreas.  The lesion is 3.8 x 2.9 cm with SUV of 11.3. No evidence of metastatic peritoneal disease elsewhere in the abdomen pelvis.  No evidence of distant metastasis.   11/03/2020 Imaging   PET scan showed recurrent peritoneal metastasis near pancreas. Given that he has no other distant metastasis. Discussed with radiation oncology. Repeat SBRT may be considered if he does not respond well to systemic chemotherapy.     11/26/2020 -  Chemotherapy   5-FU and bevacizumab.   02/17/2021 Imaging  02/17/2021 CT abdomen pelvis showed partial response. Mild decrease of peritoneal lesion.  Proceed with 5-FU and bevacizumab today.  Given that he is tolerating current regimen with good life quality, partial response.  Shared decision was made to hold off adding additional chemotherapy agents.   06/08/2021, CT chest abdomen pelvis with contrast showed soft tissue mass at the base of mesentery inferior to the pancreatic neck is unchanged in size.  No progressive disease was identified.   09/07/2021, CT chest abdomen pelvis with contrast showed no substantial changes in size of the mesenteric mass, 2.6 cm.  No suspicious new lesions.  Chronic  findings as detailed in the imaging report.   12/05/2021 Imaging   PET scan showed soft tissue mesenteric mass is unchanged in size.  Decreased FDG uptake compared to activity on November 03, 2020.  Fever treated disease.  No new metastatic disease.   03/21/2022 Imaging   CT chest abdomen pelvis  1. Similar size of the peripancreatic presumed peritoneal implant compared to 09/07/2021. 2. No new or progressive disease. 3. Incidental findings, including: Coronary artery atherosclerosis.Aortic Atherosclerosis (ICD10-I70.0). Prostatomegaly with chronic bladder wall thickening, suggesting outlet obstruction.     07/11/2022 Imaging   CT chest abdomen pelvis with contrast showed 1. New 6 mm nodule in the right upper lobe and 2 mm nodule in the left upper lobe. Although small these are concerning. Recommend short follow-up in 3 months 2. Stable low-attenuation lesion along the midbody of the pancreas. Continued attention on follow-up. No new peritoneal mass lesions or ascites. 3. Fatty liver infiltration 4. Colonic surgical changes   10/03/2022 Imaging   CT chest abdomen pelvis w contrast showed 1. Slight interval enlargement and cavitation of a nodule in the right upper lobe, measuring 0.6 cm, previously 0.5 cm. Slight interval enlargement of a left upper lobe nodule measuring 0.3 cm,previously 0.2 cm. Findings are concerning for slowly enlarging small pulmonary metastases. 2. No significant change in a lobulated soft tissue mass within the central small bowel mesentery abutting the inferior aspect of the pancreas, consistent with a stable, treated metastasis. 3. No other evidence of lymphadenopathy or metastatic disease in the chest, abdomen, or pelvis. 4. Prostatomegaly with diffuse wall thickening of the relatively decompressed urinary bladder, likely secondary to chronic outlet obstruction. 5. Unchanged fibrotic scarring and bronchiectasis of the bilateral lung bases. This could be further assessed by  pulmonary referral and dedicated interstitial lung disease protocol CT examination of the chest if and when clinically appropriate in the setting of known metastatic malignancy. Aortic Atherosclerosis     Other medical problems Chronic lower extremity edema. 12/24/2018, right lower extremity duplex negative for DVT.  Small right Baker's cyst. 08/16/2019, bilateral lower extremity duplex showed no DVT. 08/16/2019 - 08/17/2019 with right lower extremity cellulitis.  He was unable to bear weight.  He was treated with IV fluids, NSAIDs, colchicine, and broad antibiotics (vancomycin and Cefepime).  He was discharged on indomethacin x 5 days and Keflex 500 mg TID x 5 days.  10/05/2020, colonoscopy showed internal hemorrhoids.  Otherwise normal examination.  INTERVAL HISTORY Jared Gutierrez is a 82 y.o. male who has above history reviewed by me today presents for follow up visit for management of recurrent metastatic colon cancer Patient was accompanied by daughter Denies fever, chills, nausea, vomiting, diarrhea, chest pain, shortness of breath, abdominal pain,    Review of Systems  Constitutional:  Negative for appetite change, chills, diaphoresis, fever and unexpected weight change.  HENT:   Negative for hearing  loss, nosebleeds, sore throat and tinnitus.   Respiratory:  Negative for cough, hemoptysis and shortness of breath.   Cardiovascular:  Negative for chest pain and palpitations.  Gastrointestinal:  Negative for abdominal pain, blood in stool, constipation, diarrhea, nausea and vomiting.  Genitourinary:  Negative for dysuria, frequency and hematuria.   Musculoskeletal:  Positive for arthralgias. Negative for back pain, myalgias and neck pain.  Skin:  Negative for itching and rash.  Neurological:  Positive for numbness. Negative for dizziness and headaches.  Hematological:  Does not bruise/bleed easily.  Psychiatric/Behavioral:  Negative for depression. The patient is not nervous/anxious.        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   Chicken pox    Colon cancer (HCC)    History of kidney stones    Hypertension    Lipoma of colon    Nephrolithiasis    Nephrolithiasis    Obesity    Shingles    Tubular adenoma of colon    multiple fragments    Past Surgical History:  Procedure Laterality Date   COLON SURGERY     COLONOSCOPY N/A 10/02/2014   Procedure: COLONOSCOPY;  Surgeon: Elnita Maxwell, MD;  Location: [facility] ENDOSCOPY;  Service: Endoscopy;  Laterality: N/A;   COLONOSCOPY WITH PROPOFOL N/A 02/16/2017   Procedure: COLONOSCOPY WITH PROPOFOL;  Surgeon: Scot Jun, MD;  Location: [facility] ENDOSCOPY;  Service: Endoscopy;  Laterality: N/A;   COLONOSCOPY WITH PROPOFOL N/A 10/05/2020   Procedure: COLONOSCOPY WITH PROPOFOL;  Surgeon: Regis Bill, MD;  Location: [facility] ENDOSCOPY;  Service: Endoscopy;  Laterality: N/A;   EUS N/A 04/17/2019   Procedure: FULL UPPER ENDOSCOPIC ULTRASOUND (EUS) RADIAL;  Surgeon: Bearl Mulberry, MD;  Location: [facility] ENDOSCOPY;  Service: Gastroenterology;  Laterality: N/A;   KIDNEY STONE SURGERY     PARTIAL COLECTOMY  10/17/2013   PORTACATH PLACEMENT Right 06/13/2019   Procedure: INSERTION PORT-A-CATH;  Surgeon: Sung Amabile, DO;  Location: [facility] ORS;  Service: General;  Laterality: Right;    Family History  Problem Relation Age of Onset   Cancer Mother    Breast cancer Mother    COPD Father     Social History:  reports that he has never smoked. He has never used smokeless tobacco. He reports current alcohol use. He reports that he does not use drugs.    Allergies: No Known Allergies  Current Medications: Current Outpatient Medications  Medication Sig Dispense Refill   acetaminophen (TYLENOL) 500 MG tablet Take 500 mg by mouth every 6 (six) hours as needed.     amLODipine (NORVASC) 2.5 MG tablet Take 2.5 mg by mouth daily.      aspirin EC 81 MG tablet Take 81 mg by mouth daily.     Calcium Carbonate (CALCIUM 600 PO) Take 1 tablet by mouth 2 (two) times daily.     Cholecalciferol (VITAMIN D) 125 MCG (5000 UT) CAPS Take 5,000 mg by mouth.     docusate sodium (COLACE) 100 MG capsule Take 1 capsule (100 mg total) by mouth 2 (two) times daily as needed for mild constipation or moderate constipation. 60 capsule 3   HYDROcodone-acetaminophen (NORCO/VICODIN) 5-325 MG tablet Take 1 tablet by mouth every 6 (six) hours as needed for moderate pain (up to 3 doses for moderate pain.).     lidocaine-prilocaine (EMLA) cream APPLY TO AFFECTED AREA ONCE AS DIRECTED 30  g 3   loperamide (IMODIUM) 2 MG capsule Take 1 capsule (2 mg total) by mouth See admin instructions. Take 2 tablets after first loose stool,  then 1 tablet  after each loose stool; maximum: 8 tablets /day 60 capsule 0   loratadine (CLARITIN) 10 MG tablet Take 10 mg by mouth daily.     olmesartan (BENICAR) 20 MG tablet Take 1 tablet by mouth daily.     ondansetron (ZOFRAN) 8 MG tablet Take 1 tablet (8 mg total) by mouth 2 (two) times daily as needed for refractory nausea / vomiting. Start on day 3 after chemotherapy. 60 tablet 1   potassium chloride SA (KLOR-CON M20) 20 MEQ tablet Take 1 tablet (20 mEq total) by mouth daily. 180 tablet 1   Probiotic Product (PROBIOTIC DAILY PO) Take 1 tablet by mouth daily.     prochlorperazine (COMPAZINE) 10 MG tablet Take 1 tablet (10 mg total) by mouth every 6 (six) hours as needed (NAUSEA). 30 tablet 1   senna-docusate (SENNA S) 8.6-50 MG tablet Take 2 tablets by mouth daily. 60 tablet 3   No current facility-administered medications for this visit.   Facility-Administered Medications Ordered in Other Visits  Medication Dose Route Frequency Provider Last Rate Last Admin   0.9 %  sodium chloride infusion   Intravenous Once Jared Gutierrez, Melissa C, MD       0.9 %  sodium chloride infusion   Intravenous Continuous Nelva Nay C, MD 10  mL/hr at 12/31/19 1000 New Bag at 10/05/20 1045   fluorouracil (ADRUCIL) 5,000 mg in sodium chloride 0.9 % 150 mL chemo infusion  2,400 mg/m2 (Treatment Plan Recorded) Intravenous 1 day or 1 dose Jared Patience, MD   Infusion Verify at 11/21/22 1110   heparin lock flush 100 unit/mL  500 Units Intravenous Once Rosey Bath, MD        Performance status (ECOG): 1  Vitals Blood pressure 134/78, pulse 64, temperature (!) 97.1 F (36.2 C), temperature source Tympanic, resp. rate 18, weight 230 lb 11.2 oz (104.6 kg), SpO2 100 %.   Physical Exam Constitutional:      General: He is not in acute distress.    Appearance: He is obese. He is not diaphoretic.  HENT:     Head: Normocephalic and atraumatic.  Eyes:     General: No scleral icterus.    Pupils: Pupils are equal, round, and reactive to light.  Cardiovascular:     Rate and Rhythm: Normal rate and regular rhythm.     Heart sounds: No murmur heard. Pulmonary:     Effort: Pulmonary effort is normal. No respiratory distress.     Breath sounds: No wheezing.     Comments: Decreased breath sound bilaterally.  Abdominal:     General: There is no distension.     Palpations: Abdomen is soft.     Tenderness: There is no abdominal tenderness.  Musculoskeletal:        General: Normal range of motion.     Cervical back: Normal range of motion and neck supple.     Comments: Trace edema bilaterally  Skin:    General: Skin is warm and dry.     Findings: No erythema.  Neurological:     Mental Status: He is alert and oriented to person, place, and time. Mental status is at baseline.     Cranial Nerves: No cranial nerve deficit.     Motor: No abnormal muscle tone.  Psychiatric:  Mood and Affect: Mood and affect normal.     Labs were reviewed by me.     Latest Ref Rng & Units 11/21/2022    8:01 AM 11/07/2022    8:06 AM 10/24/2022    8:43 AM  CBC  WBC 4.0 - 10.5 K/uL 4.9  4.8  4.8   Hemoglobin 13.0 - 17.0 g/dL 16.1  09.6  04.5    Hematocrit 39.0 - 52.0 % 34.0  33.7  34.1   Platelets 150 - 400 K/uL 159  163  150       Latest Ref Rng & Units 11/21/2022    8:01 AM 11/07/2022    8:06 AM 10/24/2022    8:43 AM  CMP  Glucose 70 - 99 mg/dL 409  811  914   BUN 8 - 23 mg/dL 9  9  10    Creatinine 0.61 - 1.24 mg/dL 7.82  9.56  2.13   Sodium 135 - 145 mmol/L 135  136  133   Potassium 3.5 - 5.1 mmol/L 3.5  3.5  3.5   Chloride 98 - 111 mmol/L 103  103  103   CO2 22 - 32 mmol/L 25  23  24    Calcium 8.9 - 10.3 mg/dL 9.0  8.9  9.4   Total Protein 6.5 - 8.1 g/dL 5.9  5.8  6.1   Total Bilirubin 0.3 - 1.2 mg/dL 0.9  1.5  1.0   Alkaline Phos 38 - 126 U/L 39  39  39   AST 15 - 41 U/L 27  34  27   ALT 0 - 44 U/L 14  13  13

## 2022-11-23 ENCOUNTER — Inpatient Hospital Stay: Payer: Medicare Other

## 2022-11-23 VITALS — BP 152/82 | HR 74 | Resp 18

## 2022-11-23 DIAGNOSIS — Z85038 Personal history of other malignant neoplasm of large intestine: Secondary | ICD-10-CM

## 2022-11-23 DIAGNOSIS — C786 Secondary malignant neoplasm of retroperitoneum and peritoneum: Secondary | ICD-10-CM

## 2022-11-23 DIAGNOSIS — Z5112 Encounter for antineoplastic immunotherapy: Secondary | ICD-10-CM | POA: Diagnosis not present

## 2022-11-23 MED ORDER — SODIUM CHLORIDE 0.9% FLUSH
10.0000 mL | INTRAVENOUS | Status: DC | PRN
  Administered 2022-11-23: 10 mL
  Filled 2022-11-23: qty 10

## 2022-11-23 MED ORDER — HEPARIN SOD (PORK) LOCK FLUSH 100 UNIT/ML IV SOLN
500.0000 [IU] | Freq: Once | INTRAVENOUS | Status: AC | PRN
  Administered 2022-11-23: 500 [IU]
  Filled 2022-11-23: qty 5

## 2022-12-04 ENCOUNTER — Encounter: Payer: Self-pay | Admitting: Oncology

## 2022-12-04 MED FILL — Dexamethasone Sodium Phosphate Inj 100 MG/10ML: INTRAMUSCULAR | Qty: 1 | Status: AC

## 2022-12-05 ENCOUNTER — Inpatient Hospital Stay (HOSPITAL_BASED_OUTPATIENT_CLINIC_OR_DEPARTMENT_OTHER): Payer: Medicare Other | Admitting: Oncology

## 2022-12-05 ENCOUNTER — Inpatient Hospital Stay: Payer: Medicare Other

## 2022-12-05 ENCOUNTER — Encounter: Payer: Self-pay | Admitting: Oncology

## 2022-12-05 VITALS — BP 121/81 | HR 71 | Temp 97.9°F | Resp 18 | Wt 227.4 lb

## 2022-12-05 DIAGNOSIS — C184 Malignant neoplasm of transverse colon: Secondary | ICD-10-CM

## 2022-12-05 DIAGNOSIS — Z85038 Personal history of other malignant neoplasm of large intestine: Secondary | ICD-10-CM

## 2022-12-05 DIAGNOSIS — Z5111 Encounter for antineoplastic chemotherapy: Secondary | ICD-10-CM | POA: Diagnosis not present

## 2022-12-05 DIAGNOSIS — C786 Secondary malignant neoplasm of retroperitoneum and peritoneum: Secondary | ICD-10-CM

## 2022-12-05 DIAGNOSIS — T451X5A Adverse effect of antineoplastic and immunosuppressive drugs, initial encounter: Secondary | ICD-10-CM

## 2022-12-05 DIAGNOSIS — Z5112 Encounter for antineoplastic immunotherapy: Secondary | ICD-10-CM | POA: Diagnosis not present

## 2022-12-05 DIAGNOSIS — D6481 Anemia due to antineoplastic chemotherapy: Secondary | ICD-10-CM

## 2022-12-05 LAB — CBC WITH DIFFERENTIAL/PLATELET
Abs Immature Granulocytes: 0.01 10*3/uL (ref 0.00–0.07)
Basophils Absolute: 0 10*3/uL (ref 0.0–0.1)
Basophils Relative: 1 %
Eosinophils Absolute: 0.1 10*3/uL (ref 0.0–0.5)
Eosinophils Relative: 2 %
HCT: 36 % — ABNORMAL LOW (ref 39.0–52.0)
Hemoglobin: 11.6 g/dL — ABNORMAL LOW (ref 13.0–17.0)
Immature Granulocytes: 0 %
Lymphocytes Relative: 33 %
Lymphs Abs: 1.6 10*3/uL (ref 0.7–4.0)
MCH: 30 pg (ref 26.0–34.0)
MCHC: 32.2 g/dL (ref 30.0–36.0)
MCV: 93 fL (ref 80.0–100.0)
Monocytes Absolute: 0.4 10*3/uL (ref 0.1–1.0)
Monocytes Relative: 9 %
Neutro Abs: 2.7 10*3/uL (ref 1.7–7.7)
Neutrophils Relative %: 55 %
Platelets: 159 10*3/uL (ref 150–400)
RBC: 3.87 MIL/uL — ABNORMAL LOW (ref 4.22–5.81)
RDW: 15.4 % (ref 11.5–15.5)
WBC: 4.9 10*3/uL (ref 4.0–10.5)
nRBC: 0 % (ref 0.0–0.2)

## 2022-12-05 LAB — COMPREHENSIVE METABOLIC PANEL
ALT: 16 U/L (ref 0–44)
AST: 30 U/L (ref 15–41)
Albumin: 3.4 g/dL — ABNORMAL LOW (ref 3.5–5.0)
Alkaline Phosphatase: 40 U/L (ref 38–126)
Anion gap: 8 (ref 5–15)
BUN: 10 mg/dL (ref 8–23)
CO2: 22 mmol/L (ref 22–32)
Calcium: 9 mg/dL (ref 8.9–10.3)
Chloride: 104 mmol/L (ref 98–111)
Creatinine, Ser: 0.76 mg/dL (ref 0.61–1.24)
GFR, Estimated: >60 mL/min (ref >60)
Glucose, Bld: 113 mg/dL — ABNORMAL HIGH (ref 70–99)
Potassium: 3.7 mmol/L (ref 3.5–5.1)
Sodium: 134 mmol/L — ABNORMAL LOW (ref 135–145)
Total Bilirubin: 0.9 mg/dL (ref 0.3–1.2)
Total Protein: 6 g/dL — ABNORMAL LOW (ref 6.5–8.1)

## 2022-12-05 LAB — PROTEIN, URINE, RANDOM: Total Protein, Urine: 73 mg/dL

## 2022-12-05 MED ORDER — FLUOROURACIL CHEMO INJECTION 2.5 GM/50ML
400.0000 mg/m2 | Freq: Once | INTRAVENOUS | Status: AC
  Administered 2022-12-05: 900 mg via INTRAVENOUS
  Filled 2022-12-05: qty 18

## 2022-12-05 MED ORDER — SODIUM CHLORIDE 0.9% FLUSH
10.0000 mL | Freq: Once | INTRAVENOUS | Status: AC
  Administered 2022-12-05: 10 mL via INTRAVENOUS
  Filled 2022-12-05: qty 10

## 2022-12-05 MED ORDER — SODIUM CHLORIDE 0.9 % IV SOLN
5.0000 mg/kg | Freq: Once | INTRAVENOUS | Status: AC
  Administered 2022-12-05: 500 mg via INTRAVENOUS
  Filled 2022-12-05: qty 16

## 2022-12-05 MED ORDER — SODIUM CHLORIDE 0.9 % IV SOLN
2400.0000 mg/m2 | INTRAVENOUS | Status: DC
  Administered 2022-12-05: 5000 mg via INTRAVENOUS
  Filled 2022-12-05: qty 100

## 2022-12-05 MED ORDER — SODIUM CHLORIDE 0.9 % IV SOLN
400.0000 mg/m2 | Freq: Once | INTRAVENOUS | Status: AC
  Administered 2022-12-05: 884 mg via INTRAVENOUS
  Filled 2022-12-05: qty 44.2

## 2022-12-05 MED ORDER — SODIUM CHLORIDE 0.9 % IV SOLN
10.0000 mg | Freq: Once | INTRAVENOUS | Status: AC
  Administered 2022-12-05: 10 mg via INTRAVENOUS
  Filled 2022-12-05: qty 10

## 2022-12-05 MED ORDER — SODIUM CHLORIDE 0.9 % IV SOLN
Freq: Once | INTRAVENOUS | Status: AC
  Filled 2022-12-05: qty 250

## 2022-12-05 NOTE — Assessment & Plan Note (Addendum)
Mild anemia. continue to monitor.

## 2022-12-05 NOTE — Assessment & Plan Note (Addendum)
Overall he tolerates chemotherapy.   CEA is relatively stable with small fluctuations. CT images shows slight lung progression.he is asymptomatic. I recommend continue current regimen and close monitor on surveillance.  Labs reviewed and discussed with patient Proceed with 5-FU/bevacizumab treatment today Continue current regimen. Plan to repeat CT in August.

## 2022-12-05 NOTE — Assessment & Plan Note (Signed)
Chemotherapy plan as listed above 

## 2022-12-05 NOTE — Patient Instructions (Signed)
[facility] [place] [facility]  Discharge Instructions: Thank you for choosing [facility] to provide your oncology and hematology care.  If you have a lab appointment with the Cancer Center, please go directly to the Cancer Center and check in at the registration area.  Wear comfortable clothing and clothing appropriate for easy access to any Portacath or PICC line.   We strive to give you quality time with your provider. You may need to reschedule your appointment if you arrive late (15 or more minutes).  Arriving late affects you and other patients whose appointments are after yours.  Also, if you miss three or more appointments without notifying the office, you may be dismissed from the clinic at the provider's discretion.      For prescription refill requests, have your pharmacy contact our office and allow 72 hours for refills to be completed.    Today you received the following chemotherapy and/or immunotherapy agents- bevacizumab, leucovorin, 5FU      To help prevent nausea and vomiting after your treatment, we encourage you to take your nausea medication as directed.  BELOW ARE SYMPTOMS THAT SHOULD BE REPORTED IMMEDIATELY: *FEVER GREATER THAN 100.4 F (38 C) OR HIGHER *CHILLS OR SWEATING *NAUSEA AND VOMITING THAT IS NOT CONTROLLED WITH YOUR NAUSEA MEDICATION *UNUSUAL SHORTNESS OF BREATH *UNUSUAL BRUISING OR BLEEDING *URINARY PROBLEMS (pain or burning when urinating, or frequent urination) *BOWEL PROBLEMS (unusual diarrhea, constipation, pain near the anus) TENDERNESS IN MOUTH AND THROAT WITH OR WITHOUT PRESENCE OF ULCERS (sore throat, sores in mouth, or a toothache) UNUSUAL RASH, SWELLING OR PAIN  UNUSUAL VAGINAL DISCHARGE OR ITCHING   Items with * indicate a potential emergency and should be followed up as soon as possible or go to the Emergency Department if any problems should occur.  Please show the CHEMOTHERAPY ALERT CARD or IMMUNOTHERAPY ALERT  CARD at check-in to the Emergency Department and triage nurse.  Should you have questions after your visit or need to cancel or reschedule your appointment, please contact [facility] [place] [facility]  [phone] and follow the prompts.  Office hours are 8:00 a.m. to 4:30 p.m. Monday - Friday. Please note that voicemails left after 4:00 p.m. may not be returned until the following business day.  We are closed weekends and major holidays. You have access to a nurse at all times for urgent questions. Please call the main number to the clinic [phone] and follow the prompts.  For any non-urgent questions, you may also contact your provider using MyChart. We now offer e-Visits for anyone 18 and older to request care online for non-urgent symptoms. For details visit [email or web address].   Also download the MyChart app! Go to the app store, search "MyChart", open the app, select [facility], and log in with your MyChart username and password.    

## 2022-12-05 NOTE — Progress Notes (Signed)
Hematology/Oncology Progress note [phone] Fax:([phone]) [phone]    Chief Complaint: Jared Gutierrez is a 82 y.o.  male presents for follow-up of metastatic colon cancer   ASSESSMENT & PLAN:   Cancer Staging  Cancer of transverse colon [facility]) Staging form: Colon and Rectum, [name], [name], [name], MD on 10/26/2021   Cancer of transverse colon (HCC) Overall he tolerates chemotherapy.   CEA is relatively stable with small fluctuations. CT images shows slight lung progression.he is asymptomatic. I recommend continue current regimen and close monitor on surveillance.  Labs reviewed and discussed with patient Proceed with 5-FU/bevacizumab treatment today Continue current regimen. Plan to repeat CT in August.   Encounter for antineoplastic chemotherapy Chemotherapy plan as listed above.   Anemia due to chemotherapy Mild anemia. continue to monitor.  Follow-up 2 weeks lab MD 5-FU/bevacizumab -   All questions were answered. The patient knows to call the clinic with any problems, questions or concerns.  [name], MD, PhD [facility] Hematology Oncology 12/05/2022     PERTINENT ONCOLOGY HISTORY Jared Gutierrez is a 82 y.o.[place] who has above oncology history reviewed by me today presented for follow up visit for management of Metastatic colon cancer. [name], patient switched care to me on 11/11/20 Extensive medical record review was performed by me  Oncology History  History of colon cancer, stage I  10/02/2014 Pathology Results   [id number] colonoscopy pathology A. CECAL POLYPS; HOT SNARE AND COLD BIOPSY:  - TUBULAR ADENOMA, MULTIPLE FRAGMENTS.  - NEGATIVE FOR HIGH-GRADE DYSPLASIA AND MALIGNANCY.   B. COLON POLYP, TRANSVERSE; COLD BIOPSY:  - TUBULAR ADENOMA.  - NEGATIVE FOR HIGH-GRADE DYSPLASIA AND MALIGNANCY.     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  Tumor extended into the submucosa. Margins were negative. + lymphovascular invasion. 14 lymph nodes were negative. Pathologic stage was T1 N0.  TRANSVERSE COLON, RESECTION:  - INVASIVE ADENOCARCINOMA ARISING IN A 4.6 CM TUBULOVILLOUS  ADENOMA WITH HIGH GRADE DYSPLASIA (MALIGNANT POLYP).  - SEE SUMMARY BELOW.  [name]  ONCOLOGY SUMMARY: COLON AND RECTUM, RESECTION, AJCC 7TH EDITION  Specimen: transverse colon  Procedure: transverse colon resection  Tumor site: splenic flexure  Tumor size: 1.0 cm (invasive component)  Macroscopic tumor perforation: not specified  Histologic type: invasive adenocarcinoma  Histologic grade: moderately differentiated  Microscopic tumor extension: into submucosa  Margins:       Proximal margin: negative       Distal margin: negative       Circumferential (radial) or mesenteric margin: negative       If all margins uninvolved by invasive carcinoma:  Distance of invasive carcinoma from closest margin: 7.5 cm  to distal margin  Treatment effect: not applicable  Lymph-vascular invasion: present  Perineural invasion: not identified  Tumor deposits (discontinuous extramural extension): not  identified   Pathologic staging:       Primary tumor: pT1       Regional lymph nodes: pN0            Number of nodes examined: 14            Number of nodes involved: 0    04/02/2019 Progression   04/02/2019  PET scan  limited evealed a 2.4 x 2.3 cm (SUV 11) hypermetabolic soft tissue density caudal and anterior to the pancreatic neck favored to represent isolated peritoneal or nodal metastasis in the setting of prior transverse colonic resection (expected primary drainage). Although this was immediately adjacent to the pancreas, a fat plane was maintained, arguing strongly against a pancreatic primary. Otherwise, there was no evidence of hypermetabolic metastasis. 04/17/2019 EUS on  revealed a normal esophagus, stomach, duodenum, and pancreas.  There was a 2.4 x 2.4 cm irregular mass in the retroperitoneum adjacent to, but not involving the pancreatic neck.  FNA and core needle biopsy were performed.  Pathology revealed adenocarcinoma compatible with a metastatic lesion of colorectal origin.  Tumor cells were positive for CK20 and CDX2 and negative for CK7.  NGS: [employer] on 05/15/2019 revealed + [id number] and TP53.  Negative results included BRAF V600E, Her2, NRAS, NTRK, PD-L1 (<1%), and TMB 8.7/Mb (intermediate).  MMR testing from his colon resection on 10/16/2013 was intact with a low probability of MSI-H.   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  He received Neulasta after cycle #4 and #5 secondary to progressive leukopenia.  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  He received a truncated course of FOLFOX with cycle #11 secondary to oxaliplatin reaction.   01/14/2020 Imaging    PET revealed an interval decrease in size and FDG uptake (2.4 x 2.3 cm with SUV 11 to 2.4 x 1.7 cm with SUV 4.09) associated with the previously referenced soft tissue  density caudal and anterior to the pancreatic neck suggesting treatment response. There were no new sites of FDG avid tumor.   08/31/2020 Imaging   08/31/2020 Abdomen and pelvis CT revealed increased size of the index soft tissue lesion inferior and anterior to the pancreatic neck (2.9 x 1.8 cm to 3.5 x 2.9 cm). There were similar prominent retroperitoneal lymph nodes without adenopathy by size criteria. There was no new or enlarging abdominal or pelvic lymph nodes. There were no new interval findings. There was similar circumferential wall thickening of a nondistended urinary bladder, which likely accentuated wall thickening There was hepatic steatosis and aortic atherosclerosis.   11/03/2020, PET showed recurrent peritoneal metastasis in the upper abdomen adjacent to the pancreas.  The lesion is 3.8 x 2.9 cm with SUV of 11.3. No evidence of metastatic peritoneal disease elsewhere in the abdomen pelvis.  No evidence of distant metastasis.   11/03/2020 Imaging   PET scan showed recurrent peritoneal metastasis near pancreas. Given that he has no other distant metastasis. Discussed with radiation oncology. Repeat SBRT may be considered if he does not respond well to systemic chemotherapy.     11/26/2020 -  Chemotherapy   5-FU and bevacizumab.   02/17/2021 Imaging  02/17/2021 CT abdomen pelvis showed partial response. Mild decrease of peritoneal lesion.  Proceed with 5-FU and bevacizumab today.  Given that he is tolerating current regimen with good life quality, partial response.  Shared decision was made to hold off adding additional chemotherapy agents.   06/08/2021, CT chest abdomen pelvis with contrast showed soft tissue mass at the base of mesentery inferior to the pancreatic neck is unchanged in size.  No progressive disease was identified.   09/07/2021, CT chest abdomen pelvis with contrast showed no substantial changes in size of the mesenteric mass, 2.6 cm.  No suspicious new lesions.  Chronic  findings as detailed in the imaging report.   12/05/2021 Imaging   PET scan showed soft tissue mesenteric mass is unchanged in size.  Decreased FDG uptake compared to activity on November 03, 2020.  Fever treated disease.  No new metastatic disease.   03/21/2022 Imaging   CT chest abdomen pelvis  1. Similar size of the peripancreatic presumed peritoneal implant compared to 09/07/2021. 2. No new or progressive disease. 3. Incidental findings, including: Coronary artery atherosclerosis.Aortic Atherosclerosis (ICD10-I70.0). Prostatomegaly with chronic bladder wall thickening, suggesting outlet obstruction.     07/11/2022 Imaging   CT chest abdomen pelvis with contrast showed 1. New 6 mm nodule in the right upper lobe and 2 mm nodule in the left upper lobe. Although small these are concerning. Recommend short follow-up in 3 months 2. Stable low-attenuation lesion along the midbody of the pancreas. Continued attention on follow-up. No new peritoneal mass lesions or ascites. 3. Fatty liver infiltration 4. Colonic surgical changes   10/03/2022 Imaging   CT chest abdomen pelvis w contrast showed 1. Slight interval enlargement and cavitation of a nodule in the right upper lobe, measuring 0.6 cm, previously 0.5 cm. Slight interval enlargement of a left upper lobe nodule measuring 0.3 cm,previously 0.2 cm. Findings are concerning for slowly enlarging small pulmonary metastases. 2. No significant change in a lobulated soft tissue mass within the central small bowel mesentery abutting the inferior aspect of the pancreas, consistent with a stable, treated metastasis. 3. No other evidence of lymphadenopathy or metastatic disease in the chest, abdomen, or pelvis. 4. Prostatomegaly with diffuse wall thickening of the relatively decompressed urinary bladder, likely secondary to chronic outlet obstruction. 5. Unchanged fibrotic scarring and bronchiectasis of the bilateral lung bases. This could be further assessed by  pulmonary referral and dedicated interstitial lung disease protocol CT examination of the chest if and when clinically appropriate in the setting of known metastatic malignancy. Aortic Atherosclerosis     Other medical problems Chronic lower extremity edema. 12/24/2018, right lower extremity duplex negative for DVT.  Small right Baker's cyst. 08/16/2019, bilateral lower extremity duplex showed no DVT. 08/16/2019 - 08/17/2019 with right lower extremity cellulitis.  He was unable to bear weight.  He was treated with IV fluids, NSAIDs, colchicine, and broad antibiotics (vancomycin and Cefepime).  He was discharged on indomethacin x 5 days and Keflex 500 mg TID x 5 days.  10/05/2020, colonoscopy showed internal hemorrhoids.  Otherwise normal examination.  INTERVAL HISTORY Jared Gutierrez is a 82 y.o. male who has above history reviewed by me today presents for follow up visit for management of recurrent metastatic colon cancer Patient was accompanied by wife Denies fever, chills, nausea, vomiting, diarrhea, chest pain, shortness of breath, abdominal pain,    Review of Systems  Constitutional:  Negative for appetite change, chills, diaphoresis, fever and unexpected weight change.  HENT:   Negative for hearing  loss, nosebleeds, sore throat and tinnitus.   Respiratory:  Negative for cough, hemoptysis and shortness of breath.   Cardiovascular:  Negative for chest pain and palpitations.  Gastrointestinal:  Negative for abdominal pain, blood in stool, constipation, diarrhea, nausea and vomiting.  Genitourinary:  Negative for dysuria, frequency and hematuria.   Musculoskeletal:  Positive for arthralgias. Negative for back pain, myalgias and neck pain.  Skin:  Negative for itching and rash.  Neurological:  Positive for numbness. Negative for dizziness and headaches.  Hematological:  Does not bruise/bleed easily.  Psychiatric/Behavioral:  Negative for depression. The patient is not nervous/anxious.        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   Chicken pox    Colon cancer (HCC)    History of kidney stones    Hypertension    Lipoma of colon    Nephrolithiasis    Nephrolithiasis    Obesity    Shingles    Tubular adenoma of colon    multiple fragments    Past Surgical History:  Procedure Laterality Date   COLON SURGERY     COLONOSCOPY N/A 10/02/2014   Procedure: COLONOSCOPY;  Surgeon: Elnita Maxwell, MD;  Location[facility] ENDOSCOPY;  Service: Endoscopy;  Laterality: N/A;   COLONOSCOPY WITH PROPOFOL N/A 02/16/2017   Procedure: COLONOSCOPY WITH PROPOFOL;  Surgeon: Scot Jun, MD;  Location: [facility] ENDOSCOPY;  Service: Endoscopy;  Laterality: N/A;   COLONOSCOPY WITH PROPOFOL N/A 10/05/2020   Procedure: COLONOSCOPY WITH PROPOFOL;  Surgeon: Regis Bill, MD;  Location: [facility] ENDOSCOPY;  Service: Endoscopy;  Laterality: N/A;   EUS N/A 04/17/2019   Procedure: FULL UPPER ENDOSCOPIC ULTRASOUND (EUS) RADIAL;  Surgeon: Bearl Mulberry, MD;  Location: [facility] ENDOSCOPY;  Service: Gastroenterology;  Laterality: N/A;   KIDNEY STONE SURGERY     PARTIAL COLECTOMY  10/17/2013   PORTACATH PLACEMENT Right 06/13/2019   Procedure: INSERTION PORT-A-CATH;  Surgeon: Sung Amabile, DO;  Location: [facility] ORS;  Service: General;  Laterality: Right;    Family History  Problem Relation Age of Onset   Cancer Mother    Breast cancer Mother    COPD Father     Social History:  reports that he has never smoked. He has never used smokeless tobacco. He reports current alcohol use. He reports that he does not use drugs.    Allergies: No Known Allergies  Current Medications: Current Outpatient Medications  Medication Sig Dispense Refill   acetaminophen (TYLENOL) 500 MG tablet Take 500 mg by mouth every 6 (six) hours as needed.     amLODipine (NORVASC) 2.5 MG tablet Take 2.5 mg by mouth daily.      aspirin EC 81 MG tablet Take 81 mg by mouth daily.     Calcium Carbonate (CALCIUM 600 PO) Take 1 tablet by mouth 2 (two) times daily.     Cholecalciferol (VITAMIN D) 125 MCG (5000 UT) CAPS Take 5,000 mg by mouth.     docusate sodium (COLACE) 100 MG capsule Take 1 capsule (100 mg total) by mouth 2 (two) times daily as needed for mild constipation or moderate constipation. 60 capsule 3   HYDROcodone-acetaminophen (NORCO/VICODIN) 5-325 MG tablet Take 1 tablet by mouth every 6 (six) hours as needed for moderate pain (up to 3 doses for moderate pain.).     lidocaine-prilocaine (EMLA) cream APPLY TO AFFECTED AREA ONCE AS DIRECTED 30  g 3   loperamide (IMODIUM) 2 MG capsule Take 1 capsule (2 mg total) by mouth See admin instructions. Take 2 tablets after first loose stool,  then 1 tablet  after each loose stool; maximum: 8 tablets /day 60 capsule 0   loratadine (CLARITIN) 10 MG tablet Take 10 mg by mouth daily.     olmesartan (BENICAR) 20 MG tablet Take 1 tablet by mouth daily.     ondansetron (ZOFRAN) 8 MG tablet Take 1 tablet (8 mg total) by mouth 2 (two) times daily as needed for refractory nausea / vomiting. Start on day 3 after chemotherapy. 60 tablet 1   potassium chloride SA (KLOR-CON M20) 20 MEQ tablet Take 1 tablet (20 mEq total) by mouth daily. 180 tablet 1   Probiotic Product (PROBIOTIC DAILY PO) Take 1 tablet by mouth daily.     prochlorperazine (COMPAZINE) 10 MG tablet Take 1 tablet (10 mg total) by mouth every 6 (six) hours as needed (NAUSEA). 30 tablet 1   senna-docusate (SENNA S) 8.6-50 MG tablet Take 2 tablets by mouth daily. 60 tablet 3   No current facility-administered medications for this visit.   Facility-Administered Medications Ordered in Other Visits  Medication Dose Route Frequency Provider Last Rate Last Admin   0.9 %  sodium chloride infusion   Intravenous Once Corcoran, Melissa C, MD       0.9 %  sodium chloride infusion   Intravenous Continuous Nelva Nay C, MD 10  mL/hr at 12/31/19 1000 New Bag at 10/05/20 1045   fluorouracil (ADRUCIL) 5,000 mg in sodium chloride 0.9 % 150 mL chemo infusion  2,400 mg/m2 (Treatment Plan Recorded) Intravenous 1 day or 1 dose [name], MD   Infusion Verify at 12/05/22 1138   heparin lock flush 100 unit/mL  500 Units Intravenous Once Rosey Bath, MD        Performance status (ECOG): 1  Vitals Blood pressure 121/81, pulse 71, temperature 97.9 F (36.6 C), resp. rate 18, weight 227 lb 6.4 oz (103.1 kg), SpO2 100%.   Physical Exam Constitutional:      General: He is not in acute distress.    Appearance: He is obese. He is not diaphoretic.  HENT:     Head: Normocephalic and atraumatic.  Eyes:     General: No scleral icterus.    Pupils: Pupils are equal, round, and reactive to light.  Cardiovascular:     Rate and Rhythm: Normal rate and regular rhythm.     Heart sounds: No murmur heard. Pulmonary:     Effort: Pulmonary effort is normal. No respiratory distress.     Breath sounds: No wheezing.     Comments: Decreased breath sound bilaterally.  Abdominal:     General: There is no distension.     Palpations: Abdomen is soft.     Tenderness: There is no abdominal tenderness.  Musculoskeletal:        General: Normal range of motion.     Cervical back: Normal range of motion and neck supple.     Comments: Trace edema bilaterally  Skin:    General: Skin is warm and dry.     Findings: No erythema.  Neurological:     Mental Status: He is alert and oriented to person, place, and time. Mental status is at baseline.     Cranial Nerves: No cranial nerve deficit.     Motor: No abnormal muscle tone.  Psychiatric:        Mood and Affect: Mood and  affect normal.     Labs were reviewed by me.     Latest Ref Rng & Units 12/05/2022    8:48 AM 11/21/2022    8:01 AM 11/07/2022    8:06 AM  CBC  WBC 4.0 - 10.5 K/uL 4.9  4.9  4.8   Hemoglobin 13.0 - 17.0 g/dL 65.7  84.6  96.2   Hematocrit 39.0 - 52.0 % 36.0  34.0   33.7   Platelets 150 - 400 K/uL 159  159  163       Latest Ref Rng & Units 12/05/2022    8:48 AM 11/21/2022    8:01 AM 11/07/2022    8:06 AM  CMP  Glucose 70 - 99 mg/dL 952  841  324   BUN 8 - 23 mg/dL 10  9  9    Creatinine 0.61 - 1.24 mg/dL 4.01  0.27  2.53   Sodium 135 - 145 mmol/L 134  135  136   Potassium 3.5 - 5.1 mmol/L 3.7  3.5  3.5   Chloride 98 - 111 mmol/L 104  103  103   CO2 22 - 32 mmol/L 22  25  23    Calcium 8.9 - 10.3 mg/dL 9.0  9.0  8.9   Total Protein 6.5 - 8.1 g/dL 6.0  5.9  5.8   Total Bilirubin 0.3 - 1.2 mg/dL 0.9  0.9  1.5   Alkaline Phos 38 - 126 U/L 40  39  39   AST 15 - 41 U/L 30  27  34   ALT 0 - 44 U/L 16  14  13

## 2022-12-05 NOTE — Progress Notes (Signed)
Pt here for follow up. No new concerns voiced.   

## 2022-12-07 ENCOUNTER — Inpatient Hospital Stay: Payer: Medicare Other

## 2022-12-07 VITALS — BP 149/67 | HR 72

## 2022-12-07 DIAGNOSIS — Z85038 Personal history of other malignant neoplasm of large intestine: Secondary | ICD-10-CM

## 2022-12-07 DIAGNOSIS — Z5112 Encounter for antineoplastic immunotherapy: Secondary | ICD-10-CM | POA: Diagnosis not present

## 2022-12-07 DIAGNOSIS — C786 Secondary malignant neoplasm of retroperitoneum and peritoneum: Secondary | ICD-10-CM

## 2022-12-07 MED ORDER — SODIUM CHLORIDE 0.9% FLUSH
10.0000 mL | INTRAVENOUS | Status: DC | PRN
  Administered 2022-12-07: 10 mL
  Filled 2022-12-07: qty 10

## 2022-12-07 MED ORDER — HEPARIN SOD (PORK) LOCK FLUSH 100 UNIT/ML IV SOLN
500.0000 [IU] | Freq: Once | INTRAVENOUS | Status: AC | PRN
  Administered 2022-12-07: 500 [IU]
  Filled 2022-12-07: qty 5

## 2022-12-14 ENCOUNTER — Encounter: Payer: Self-pay | Admitting: Oncology

## 2022-12-18 MED ORDER — FLUOROURACIL CHEMO INJECTION 2.5 GM/50ML
400.0000 mg/m2 | Freq: Once | INTRAVENOUS | Status: AC
  Administered 2022-12-19: 900 mg via INTRAVENOUS
  Filled 2022-12-18: qty 18

## 2022-12-18 MED ORDER — SODIUM CHLORIDE 0.9 % IV SOLN
2400.0000 mg/m2 | INTRAVENOUS | Status: DC
  Administered 2022-12-19: 5000 mg via INTRAVENOUS
  Filled 2022-12-18: qty 100

## 2022-12-18 MED ORDER — SODIUM CHLORIDE 0.9 % IV SOLN
400.0000 mg/m2 | Freq: Once | INTRAVENOUS | Status: AC
  Administered 2022-12-19: 884 mg via INTRAVENOUS
  Filled 2022-12-18: qty 44.2

## 2022-12-18 MED FILL — Dexamethasone Sodium Phosphate Inj 100 MG/10ML: INTRAMUSCULAR | Qty: 1 | Status: AC

## 2022-12-19 ENCOUNTER — Inpatient Hospital Stay (HOSPITAL_BASED_OUTPATIENT_CLINIC_OR_DEPARTMENT_OTHER): Payer: Medicare Other | Admitting: Oncology

## 2022-12-19 ENCOUNTER — Encounter: Payer: Self-pay | Admitting: Oncology

## 2022-12-19 ENCOUNTER — Inpatient Hospital Stay: Payer: Medicare Other

## 2022-12-19 ENCOUNTER — Inpatient Hospital Stay: Payer: Medicare Other | Attending: Oncology

## 2022-12-19 VITALS — BP 123/70 | HR 67 | Temp 96.3°F | Resp 18 | Wt 223.9 lb

## 2022-12-19 DIAGNOSIS — Z803 Family history of malignant neoplasm of breast: Secondary | ICD-10-CM | POA: Insufficient documentation

## 2022-12-19 DIAGNOSIS — Z85038 Personal history of other malignant neoplasm of large intestine: Secondary | ICD-10-CM | POA: Diagnosis not present

## 2022-12-19 DIAGNOSIS — D6481 Anemia due to antineoplastic chemotherapy: Secondary | ICD-10-CM | POA: Insufficient documentation

## 2022-12-19 DIAGNOSIS — Z452 Encounter for adjustment and management of vascular access device: Secondary | ICD-10-CM | POA: Insufficient documentation

## 2022-12-19 DIAGNOSIS — E876 Hypokalemia: Secondary | ICD-10-CM

## 2022-12-19 DIAGNOSIS — R634 Abnormal weight loss: Secondary | ICD-10-CM

## 2022-12-19 DIAGNOSIS — C786 Secondary malignant neoplasm of retroperitoneum and peritoneum: Secondary | ICD-10-CM | POA: Diagnosis not present

## 2022-12-19 DIAGNOSIS — Z5111 Encounter for antineoplastic chemotherapy: Secondary | ICD-10-CM | POA: Insufficient documentation

## 2022-12-19 DIAGNOSIS — T451X5D Adverse effect of antineoplastic and immunosuppressive drugs, subsequent encounter: Secondary | ICD-10-CM | POA: Diagnosis not present

## 2022-12-19 DIAGNOSIS — C184 Malignant neoplasm of transverse colon: Secondary | ICD-10-CM | POA: Diagnosis present

## 2022-12-19 DIAGNOSIS — Z809 Family history of malignant neoplasm, unspecified: Secondary | ICD-10-CM | POA: Diagnosis not present

## 2022-12-19 DIAGNOSIS — Z86010 Personal history of colonic polyps: Secondary | ICD-10-CM | POA: Insufficient documentation

## 2022-12-19 DIAGNOSIS — K648 Other hemorrhoids: Secondary | ICD-10-CM | POA: Insufficient documentation

## 2022-12-19 DIAGNOSIS — Z836 Family history of other diseases of the respiratory system: Secondary | ICD-10-CM | POA: Diagnosis not present

## 2022-12-19 DIAGNOSIS — Z7982 Long term (current) use of aspirin: Secondary | ICD-10-CM | POA: Diagnosis not present

## 2022-12-19 DIAGNOSIS — Z5112 Encounter for antineoplastic immunotherapy: Secondary | ICD-10-CM | POA: Insufficient documentation

## 2022-12-19 DIAGNOSIS — T451X5A Adverse effect of antineoplastic and immunosuppressive drugs, initial encounter: Secondary | ICD-10-CM

## 2022-12-19 DIAGNOSIS — Z79899 Other long term (current) drug therapy: Secondary | ICD-10-CM | POA: Diagnosis not present

## 2022-12-19 LAB — CBC WITH DIFFERENTIAL/PLATELET
Abs Immature Granulocytes: 0.01 10*3/uL (ref 0.00–0.07)
Basophils Absolute: 0 10*3/uL (ref 0.0–0.1)
Basophils Relative: 1 %
Eosinophils Absolute: 0.1 10*3/uL (ref 0.0–0.5)
Eosinophils Relative: 2 %
HCT: 34.6 % — ABNORMAL LOW (ref 39.0–52.0)
Hemoglobin: 11.1 g/dL — ABNORMAL LOW (ref 13.0–17.0)
Immature Granulocytes: 0 %
Lymphocytes Relative: 33 %
Lymphs Abs: 1.6 10*3/uL (ref 0.7–4.0)
MCH: 29.4 pg (ref 26.0–34.0)
MCHC: 32.1 g/dL (ref 30.0–36.0)
MCV: 91.5 fL (ref 80.0–100.0)
Monocytes Absolute: 0.5 10*3/uL (ref 0.1–1.0)
Monocytes Relative: 10 %
Neutro Abs: 2.6 10*3/uL (ref 1.7–7.7)
Neutrophils Relative %: 54 %
Platelets: 164 10*3/uL (ref 150–400)
RBC: 3.78 MIL/uL — ABNORMAL LOW (ref 4.22–5.81)
RDW: 15.2 % (ref 11.5–15.5)
WBC: 4.8 10*3/uL (ref 4.0–10.5)
nRBC: 0 % (ref 0.0–0.2)

## 2022-12-19 LAB — COMPREHENSIVE METABOLIC PANEL
ALT: 14 U/L (ref 0–44)
AST: 31 U/L (ref 15–41)
Albumin: 3.2 g/dL — ABNORMAL LOW (ref 3.5–5.0)
Alkaline Phosphatase: 41 U/L (ref 38–126)
Anion gap: 8 (ref 5–15)
BUN: 8 mg/dL (ref 8–23)
CO2: 23 mmol/L (ref 22–32)
Calcium: 8.9 mg/dL (ref 8.9–10.3)
Chloride: 104 mmol/L (ref 98–111)
Creatinine, Ser: 0.62 mg/dL (ref 0.61–1.24)
GFR, Estimated: >60 mL/min (ref >60)
Glucose, Bld: 130 mg/dL — ABNORMAL HIGH (ref 70–99)
Potassium: 3.4 mmol/L — ABNORMAL LOW (ref 3.5–5.1)
Sodium: 135 mmol/L (ref 135–145)
Total Bilirubin: 1.3 mg/dL — ABNORMAL HIGH (ref 0.3–1.2)
Total Protein: 5.8 g/dL — ABNORMAL LOW (ref 6.5–8.1)

## 2022-12-19 LAB — PROTEIN, URINE, RANDOM: Total Protein, Urine: 67 mg/dL

## 2022-12-19 MED ORDER — SODIUM CHLORIDE 0.9 % IV SOLN
Freq: Once | INTRAVENOUS | Status: AC
  Filled 2022-12-19: qty 250

## 2022-12-19 MED ORDER — SODIUM CHLORIDE 0.9 % IV SOLN
5.0000 mg/kg | Freq: Once | INTRAVENOUS | Status: AC
  Administered 2022-12-19: 500 mg via INTRAVENOUS
  Filled 2022-12-19: qty 16

## 2022-12-19 MED ORDER — SODIUM CHLORIDE 0.9 % IV SOLN
10.0000 mg | Freq: Once | INTRAVENOUS | Status: AC
  Administered 2022-12-19: 10 mg via INTRAVENOUS
  Filled 2022-12-19: qty 10

## 2022-12-19 NOTE — Assessment & Plan Note (Signed)
Potassium has been stable. Continue potassium chloride daily.

## 2022-12-19 NOTE — Assessment & Plan Note (Signed)
Chemotherapy plan as listed above 

## 2022-12-19 NOTE — Assessment & Plan Note (Signed)
Hb slight decreased due to chemotherapy. continue to monitor.

## 2022-12-19 NOTE — Assessment & Plan Note (Addendum)
Overall he tolerates chemotherapy.   CEA is relatively stable with small fluctuations. CT images shows slight lung progression.he is asymptomatic. I recommend continue current regimen and close monitor on surveillance.  Labs reviewed and discussed with patient Proceed with 5-FU/bevacizumab treatment today Continue current regimen. Plan to repeat CT in August.

## 2022-12-19 NOTE — Progress Notes (Addendum)
Hematology/Oncology Progress note [phone] Fax:([phone]) [phone]    Chief Complaint: Jared Gutierrez is a 82 y.o.  male presents for follow-up of metastatic colon cancer   ASSESSMENT & PLAN:   Cancer Staging  Cancer of transverse colon [facility]) Staging form: Colon and Rectum, [name], [name], [name], MD on 10/26/2021   Cancer of transverse colon (HCC) Overall he tolerates chemotherapy.   CEA is relatively stable with small fluctuations. CT images shows slight lung progression.he is asymptomatic. I recommend continue current regimen and close monitor on surveillance.  Labs reviewed and discussed with patient Proceed with 5-FU/bevacizumab treatment today Continue current regimen. Plan to repeat CT in August.   Encounter for antineoplastic chemotherapy Chemotherapy plan as listed above.   Anemia due to chemotherapy Hb slight decreased due to chemotherapy. continue to monitor.  Weight loss Encourage protein rich diet.   Hypokalemia Potassium has been stable. Continue potassium chloride daily.   Follow-up 2 weeks lab MD 5-FU/bevacizumab -   All questions were answered. The patient knows to call the clinic with any problems, questions or concerns.  [name], MD, PhD [facility] Hematology Oncology 12/19/2022     PERTINENT ONCOLOGY HISTORY Jared Gutierrez is a 82 y.o.[place] who has above oncology history reviewed by me today presented for follow up visit for management of Metastatic colon cancer. [name], patient switched care to me on 11/11/20 Extensive medical record review was performed by me  Oncology History  History of colon cancer, stage I  10/02/2014 Pathology Results   [id number] colonoscopy pathology A. CECAL POLYPS; HOT SNARE AND COLD BIOPSY:  - TUBULAR ADENOMA, MULTIPLE FRAGMENTS.  - NEGATIVE FOR HIGH-GRADE DYSPLASIA AND MALIGNANCY.   B. COLON  POLYP, TRANSVERSE; COLD BIOPSY:  - TUBULAR ADENOMA.  - NEGATIVE FOR HIGH-GRADE DYSPLASIA AND MALIGNANCY.     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  Tumor extended into the submucosa. Margins were negative. + lymphovascular invasion. 14 lymph nodes were negative. Pathologic stage was T1 N0.  TRANSVERSE COLON, RESECTION:  - INVASIVE ADENOCARCINOMA ARISING IN A 4.6 CM TUBULOVILLOUS  ADENOMA WITH HIGH GRADE DYSPLASIA (MALIGNANT POLYP).  - SEE SUMMARY BELOW.  [name]  ONCOLOGY SUMMARY: COLON AND RECTUM, RESECTION, AJCC 7TH EDITION  Specimen: transverse colon  Procedure: transverse colon resection  Tumor site: splenic flexure  Tumor size: 1.0 cm (invasive component)  Macroscopic tumor perforation: not specified  Histologic type: invasive adenocarcinoma  Histologic grade: moderately differentiated  Microscopic tumor extension: into submucosa  Margins:       Proximal margin: negative       Distal margin: negative  Circumferential (radial) or mesenteric margin: negative       If all margins uninvolved by invasive carcinoma:       Distance of invasive carcinoma from closest margin: 7.5 cm  to distal margin  Treatment effect: not  applicable  Lymph-vascular invasion: present  Perineural invasion: not identified  Tumor deposits (discontinuous extramural extension): not  identified  Pathologic staging:       Primary tumor: pT1       Regional lymph nodes: pN0            Number of nodes examined: 14            Number of nodes involved: 0    04/02/2019 Progression   04/02/2019  PET scan  limited evealed a 2.4 x 2.3 cm (SUV 11) hypermetabolic soft tissue density caudal and anterior to the pancreatic neck favored to represent isolated peritoneal or nodal metastasis in the setting of prior transverse colonic resection (expected primary drainage). Although this was immediately adjacent to the pancreas, a fat plane was maintained, arguing strongly against a pancreatic primary. Otherwise, there was no evidence of hypermetabolic metastasis. 04/17/2019 EUS on  revealed a normal esophagus, stomach, duodenum, and pancreas.  There was a 2.4 x 2.4 cm irregular mass in the retroperitoneum adjacent to, but not involving the pancreatic neck.  FNA and core needle biopsy were performed.  Pathology revealed adenocarcinoma compatible with a metastatic lesion of colorectal origin.  Tumor cells were positive for CK20 and CDX2 and negative for CK7.  NGS: [employer] on 05/15/2019 revealed + [id number] and TP53.  Negative results included BRAF V600E, Her2, NRAS, NTRK, PD-L1 (<1%), and TMB 8.7/Mb (intermediate).  MMR testing from his colon resection on 10/16/2013 was intact with a low probability of MSI-H.   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  He received Neulasta after cycle #4 and #5 secondary to progressive leukopenia.  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  He received a truncated course of FOLFOX with cycle #11 secondary to oxaliplatin reaction.   01/14/2020 Imaging    PET revealed  an interval decrease in size and FDG uptake (2.4 x 2.3 cm with SUV 11 to 2.4 x 1.7 cm with SUV 4.09) associated with the previously referenced soft tissue density caudal and anterior to the pancreatic neck suggesting treatment response. There were no new sites of FDG avid tumor.   08/31/2020 Imaging   08/31/2020 Abdomen and pelvis CT revealed increased size of the index soft tissue lesion inferior and anterior to the pancreatic neck (2.9 x 1.8 cm to 3.5 x 2.9 cm). There were similar prominent retroperitoneal lymph nodes without adenopathy by size criteria. There was no new or enlarging abdominal or pelvic lymph nodes. There were no new interval findings. There was similar circumferential wall thickening of a nondistended urinary bladder, which likely accentuated wall thickening There was hepatic steatosis and aortic atherosclerosis.   11/03/2020, PET showed recurrent peritoneal metastasis in the upper abdomen adjacent to the pancreas.  The lesion is 3.8 x 2.9 cm with SUV of 11.3. No evidence of metastatic peritoneal disease elsewhere in the abdomen pelvis.  No evidence of distant metastasis.   11/03/2020 Imaging   PET scan showed recurrent peritoneal metastasis near pancreas. Given that he has no other distant metastasis. Discussed with radiation oncology. Repeat SBRT may be considered if he does  not respond well to systemic chemotherapy.     11/26/2020 -  Chemotherapy   5-FU and bevacizumab.   02/17/2021 Imaging   02/17/2021 CT abdomen pelvis showed partial response. Mild decrease of peritoneal lesion.  Proceed with 5-FU and bevacizumab today.  Given that he is tolerating current regimen with good life quality, partial response.  Shared decision was made to hold off adding additional chemotherapy agents.   06/08/2021, CT chest abdomen pelvis with contrast showed soft tissue mass at the base of mesentery inferior to the pancreatic neck is unchanged in size.  No progressive disease was identified.    09/07/2021, CT chest abdomen pelvis with contrast showed no substantial changes in size of the mesenteric mass, 2.6 cm.  No suspicious new lesions.  Chronic findings as detailed in the imaging report.   12/05/2021 Imaging   PET scan showed soft tissue mesenteric mass is unchanged in size.  Decreased FDG uptake compared to activity on November 03, 2020.  Fever treated disease.  No new metastatic disease.   03/21/2022 Imaging   CT chest abdomen pelvis  1. Similar size of the peripancreatic presumed peritoneal implant compared to 09/07/2021. 2. No new or progressive disease. 3. Incidental findings, including: Coronary artery atherosclerosis.Aortic Atherosclerosis (ICD10-I70.0). Prostatomegaly with chronic bladder wall thickening, suggesting outlet obstruction.     07/11/2022 Imaging   CT chest abdomen pelvis with contrast showed 1. New 6 mm nodule in the right upper lobe and 2 mm nodule in the left upper lobe. Although small these are concerning. Recommend short follow-up in 3 months 2. Stable low-attenuation lesion along the midbody of the pancreas. Continued attention on follow-up. No new peritoneal mass lesions or ascites. 3. Fatty liver infiltration 4. Colonic surgical changes   10/03/2022 Imaging   CT chest abdomen pelvis w contrast showed 1. Slight interval enlargement and cavitation of a nodule in the right upper lobe, measuring 0.6 cm, previously 0.5 cm. Slight interval enlargement of a left upper lobe nodule measuring 0.3 cm,previously 0.2 cm. Findings are concerning for slowly enlarging small pulmonary metastases. 2. No significant change in a lobulated soft tissue mass within the central small bowel mesentery abutting the inferior aspect of the pancreas, consistent with a stable, treated metastasis. 3. No other evidence of lymphadenopathy or metastatic disease in the chest, abdomen, or pelvis. 4. Prostatomegaly with diffuse wall thickening of the relatively decompressed urinary bladder,  likely secondary to chronic outlet obstruction. 5. Unchanged fibrotic scarring and bronchiectasis of the bilateral lung bases. This could be further assessed by pulmonary referral and dedicated interstitial lung disease protocol CT examination of the chest if and when clinically appropriate in the setting of known metastatic malignancy. Aortic Atherosclerosis     Other medical problems Chronic lower extremity edema. 12/24/2018, right lower extremity duplex negative for DVT.  Small right Baker's cyst. 08/16/2019, bilateral lower extremity duplex showed no DVT. 08/16/2019 - 08/17/2019 with right lower extremity cellulitis.  He was unable to bear weight.  He was treated with IV fluids, NSAIDs, colchicine, and broad antibiotics (vancomycin and Cefepime).  He was discharged on indomethacin x 5 days and Keflex 500 mg TID x 5 days.  10/05/2020, colonoscopy showed internal hemorrhoids.  Otherwise normal examination.  INTERVAL HISTORY Jared Gutierrez is a 82 y.o. male who has above history reviewed by me today presents for follow up visit for management of recurrent metastatic colon cancer Patient was accompanied by wife Denies fever, chills, nausea, vomiting, diarrhea, chest pain, shortness of breath, abdominal pain,  Lost  weight. Currently not taking nutrition supplements.   Review of Systems  Constitutional:  Negative for appetite change, chills, diaphoresis, fever and unexpected weight change.  HENT:   Negative for hearing loss, nosebleeds, sore throat and tinnitus.   Respiratory:  Negative for cough, hemoptysis and shortness of breath.   Cardiovascular:  Negative for chest pain and palpitations.  Gastrointestinal:  Negative for abdominal pain, blood in stool, constipation, diarrhea, nausea and vomiting.  Genitourinary:  Negative for dysuria, frequency and hematuria.   Musculoskeletal:  Positive for arthralgias. Negative for back pain, myalgias and neck pain.  Skin:  Negative for itching and rash.   Neurological:  Positive for numbness. Negative for dizziness and headaches.  Hematological:  Does not bruise/bleed easily.  Psychiatric/Behavioral:  Negative for depression. The patient is not nervous/anxious.       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   Chicken pox    Colon cancer (HCC)    History of kidney stones    Hypertension    Lipoma of colon    Nephrolithiasis    Nephrolithiasis    Obesity    Shingles    Tubular adenoma of colon    multiple fragments    Past Surgical History:  Procedure Laterality Date   COLON SURGERY     COLONOSCOPY N/A 10/02/2014   Procedure: COLONOSCOPY;  Surgeon: Elnita Maxwell, MD;  Location[facility] ENDOSCOPY;  Service: Endoscopy;  Laterality: N/A;   COLONOSCOPY WITH PROPOFOL N/A 02/16/2017   Procedure: COLONOSCOPY WITH PROPOFOL;  Surgeon: Scot Jun, MD;  Location: [facility] ENDOSCOPY;  Service: Endoscopy;  Laterality: N/A;   COLONOSCOPY WITH PROPOFOL N/A 10/05/2020   Procedure: COLONOSCOPY WITH PROPOFOL;  Surgeon: Regis Bill, MD;  Location: [facility] ENDOSCOPY;  Service: Endoscopy;  Laterality: N/A;   EUS N/A 04/17/2019   Procedure: FULL UPPER ENDOSCOPIC ULTRASOUND (EUS) RADIAL;  Surgeon: Bearl Mulberry, MD;  Location: [facility] ENDOSCOPY;  Service: Gastroenterology;  Laterality: N/A;   KIDNEY STONE SURGERY     PARTIAL COLECTOMY  10/17/2013   PORTACATH PLACEMENT Right 06/13/2019   Procedure: INSERTION PORT-A-CATH;  Surgeon: Sung Amabile, DO;  Location: [facility] ORS;  Service: General;  Laterality: Right;    Family History  Problem Relation Age of Onset   Cancer Mother    Breast cancer Mother    COPD Father     Social History:  reports that he has never smoked. He has never used smokeless tobacco. He reports current alcohol use. He reports that he does not use drugs.    Allergies: No Known Allergies  Current Medications: Current  Outpatient Medications  Medication Sig Dispense Refill   acetaminophen (TYLENOL) 500 MG tablet Take 500 mg by mouth every 6 (six) hours as needed.     amLODipine (NORVASC) 2.5 MG tablet Take 2.5 mg by mouth daily.     aspirin EC 81 MG tablet Take 81 mg by mouth daily.     Calcium Carbonate (CALCIUM 600 PO) Take 1 tablet by mouth 2 (two) times daily.     Cholecalciferol (VITAMIN D) 125 MCG (5000 UT) CAPS Take 5,000 mg by mouth.     docusate sodium (COLACE) 100 MG capsule Take 1 capsule (100 mg total) by mouth 2 (two) times daily as needed for mild constipation or moderate constipation. 60 capsule 3   HYDROcodone-acetaminophen (NORCO/VICODIN) 5-325 MG tablet Take 1 tablet by  mouth every 6 (six) hours as needed for moderate pain (up to 3 doses for moderate pain.).     lidocaine-prilocaine (EMLA) cream APPLY TO AFFECTED AREA ONCE AS DIRECTED 30 g 3   loperamide (IMODIUM) 2 MG capsule Take 1 capsule (2 mg total) by mouth See admin instructions. Take 2 tablets after first loose stool,  then 1 tablet  after each loose stool; maximum: 8 tablets /day 60 capsule 0   loratadine (CLARITIN) 10 MG tablet Take 10 mg by mouth daily.     olmesartan (BENICAR) 20 MG tablet Take 1 tablet by mouth daily.     ondansetron (ZOFRAN) 8 MG tablet Take 1 tablet (8 mg total) by mouth 2 (two) times daily as needed for refractory nausea / vomiting. Start on day 3 after chemotherapy. 60 tablet 1   potassium chloride SA (KLOR-CON M20) 20 MEQ tablet Take 1 tablet (20 mEq total) by mouth daily. 180 tablet 1   Probiotic Product (PROBIOTIC DAILY PO) Take 1 tablet by mouth daily.     prochlorperazine (COMPAZINE) 10 MG tablet Take 1 tablet (10 mg total) by mouth every 6 (six) hours as needed (NAUSEA). 30 tablet 1   senna-docusate (SENNA S) 8.6-50 MG tablet Take 2 tablets by mouth daily. 60 tablet 3   No current facility-administered medications for this visit.   Facility-Administered Medications Ordered in Other Visits  Medication  Dose Route Frequency Provider Last Rate Last Admin   0.9 %  sodium chloride infusion   Intravenous Once Corcoran, Melissa C, MD       0.9 %  sodium chloride infusion   Intravenous Continuous Rosey Bath, MD 10 mL/hr at 12/31/19 1000 New Bag at 10/05/20 1045   bevacizumab-adcd (VEGZELMA) 500 mg in sodium chloride 0.9 % 100 mL chemo infusion  5 mg/kg (Treatment Plan Recorded) Intravenous Once [name], MD       fluorouracil (ADRUCIL) 5,000 mg in sodium chloride 0.9 % 150 mL chemo infusion  2,400 mg/m2 (Treatment Plan Recorded) Intravenous 1 day or 1 dose [name], MD       fluorouracil (ADRUCIL) chemo injection 900 mg  400 mg/m2 (Treatment Plan Recorded) Intravenous Once [name], MD       heparin lock flush 100 unit/mL  500 Units Intravenous Once Nelva Nay C, MD       leucovorin 884 mg in sodium chloride 0.9 % 250 mL infusion  400 mg/m2 (Treatment Plan Recorded) Intravenous Once [name], MD 588 mL/hr at 12/19/22 1041 884 mg at 12/19/22 1041    Performance status (ECOG): 1  Vitals Blood pressure 123/70, pulse 67, temperature (!) 96.3 F (35.7 C), temperature source Tympanic, resp. rate 18, weight 223 lb 14.4 oz (101.6 kg), SpO2 100%.   Physical Exam Constitutional:      General: He is not in acute distress.    Appearance: He is obese. He is not diaphoretic.  HENT:     Head: Normocephalic and atraumatic.  Eyes:     General: No scleral icterus.    Pupils: Pupils are equal, round, and reactive to light.  Cardiovascular:     Rate and Rhythm: Normal rate and regular rhythm.     Heart sounds: No murmur heard. Pulmonary:     Effort: Pulmonary effort is normal. No respiratory distress.     Breath sounds: No wheezing.     Comments: Decreased breath sound bilaterally.  Abdominal:     General: There is no distension.     Palpations: Abdomen is  soft.     Tenderness: There is no abdominal tenderness.  Musculoskeletal:        General: Normal range of motion.     Cervical back:  Normal range of motion and neck supple.     Comments: Trace edema bilaterally  Skin:    General: Skin is warm and dry.     Findings: No erythema.  Neurological:     Mental Status: He is alert and oriented to person, place, and time. Mental status is at baseline.     Cranial Nerves: No cranial nerve deficit.     Motor: No abnormal muscle tone.  Psychiatric:        Mood and Affect: Mood and affect normal.     Labs were reviewed by me.     Latest Ref Rng & Units 12/19/2022    9:11 AM 12/05/2022    8:48 AM 11/21/2022    8:01 AM  CBC  WBC 4.0 - 10.5 K/uL 4.8  4.9  4.9   Hemoglobin 13.0 - 17.0 g/dL 54.0  08.6  76.1   Hematocrit 39.0 - 52.0 % 34.6  36.0  34.0   Platelets 150 - 400 K/uL 164  159  159       Latest Ref Rng & Units 12/19/2022    9:11 AM 12/05/2022    8:48 AM 11/21/2022    8:01 AM  CMP  Glucose 70 - 99 mg/dL 950  932  671   BUN 8 - 23 mg/dL 8  10  9    Creatinine 0.61 - 1.24 mg/dL 2.45  8.09  9.83   Sodium 135 - 145 mmol/L 135  134  135   Potassium 3.5 - 5.1 mmol/L 3.4  3.7  3.5   Chloride 98 - 111 mmol/L 104  104  103   CO2 22 - 32 mmol/L 23  22  25    Calcium 8.9 - 10.3 mg/dL 8.9  9.0  9.0   Total Protein 6.5 - 8.1 g/dL 5.8  6.0  5.9   Total Bilirubin 0.3 - 1.2 mg/dL 1.3  0.9  0.9   Alkaline Phos 38 - 126 U/L 41  40  39   AST 15 - 41 U/L 31  30  27    ALT 0 - 44 U/L 14  16  14

## 2022-12-19 NOTE — Assessment & Plan Note (Addendum)
Encourage protein rich diet.

## 2022-12-21 ENCOUNTER — Inpatient Hospital Stay: Payer: Medicare Other

## 2022-12-21 VITALS — BP 148/76 | HR 71 | Temp 97.2°F | Resp 18

## 2022-12-21 DIAGNOSIS — Z5112 Encounter for antineoplastic immunotherapy: Secondary | ICD-10-CM | POA: Diagnosis not present

## 2022-12-21 DIAGNOSIS — Z85038 Personal history of other malignant neoplasm of large intestine: Secondary | ICD-10-CM

## 2022-12-21 DIAGNOSIS — C786 Secondary malignant neoplasm of retroperitoneum and peritoneum: Secondary | ICD-10-CM

## 2022-12-21 MED ORDER — HEPARIN SOD (PORK) LOCK FLUSH 100 UNIT/ML IV SOLN
500.0000 [IU] | Freq: Once | INTRAVENOUS | Status: AC | PRN
  Administered 2022-12-21: 500 [IU]
  Filled 2022-12-21: qty 5

## 2022-12-21 MED ORDER — SODIUM CHLORIDE 0.9% FLUSH
10.0000 mL | INTRAVENOUS | Status: DC | PRN
  Administered 2022-12-21: 10 mL
  Filled 2022-12-21: qty 10

## 2022-12-25 ENCOUNTER — Ambulatory Visit
Admission: RE | Admit: 2022-12-25 | Discharge: 2022-12-25 | Disposition: A | Discharge status: Home / Self Care | Payer: Medicare Other | Source: Ambulatory Visit | Attending: Oncology | Admitting: Oncology

## 2022-12-25 DIAGNOSIS — C184 Malignant neoplasm of transverse colon: Secondary | ICD-10-CM | POA: Insufficient documentation

## 2022-12-25 MED ORDER — IOHEXOL 300 MG/ML  SOLN
100.0000 mL | Freq: Once | INTRAMUSCULAR | Status: AC | PRN
  Administered 2022-12-25: 100 mL via INTRAVENOUS

## 2022-12-28 ENCOUNTER — Telehealth: Payer: Self-pay

## 2022-12-28 NOTE — Telephone Encounter (Signed)
-----   Message from [name] sent at 12/28/2022  8:46 AM EDT ----- Please arrange patient to get a follow up appt with Dr. Rushie Chestnut. Not new patient.  Discussed with patient and wife during last visit.

## 2022-12-28 NOTE — Telephone Encounter (Signed)
Morrie Sheldon can you please arrange patient to get a follow up appt with Dr. Rushie Chestnut. Not new patient.  Thank you!

## 2022-12-28 NOTE — Telephone Encounter (Signed)
Open in error

## 2023-01-01 MED ORDER — SODIUM CHLORIDE 0.9 % IV SOLN
400.0000 mg/m2 | Freq: Once | INTRAVENOUS | Status: AC
  Administered 2023-01-02: 884 mg via INTRAVENOUS
  Filled 2023-01-01: qty 44.2

## 2023-01-01 MED ORDER — SODIUM CHLORIDE 0.9 % IV SOLN
2400.0000 mg/m2 | INTRAVENOUS | Status: DC
  Administered 2023-01-02: 5000 mg via INTRAVENOUS
  Filled 2023-01-01: qty 100

## 2023-01-01 MED ORDER — FLUOROURACIL CHEMO INJECTION 2.5 GM/50ML
400.0000 mg/m2 | Freq: Once | INTRAVENOUS | Status: AC
  Administered 2023-01-02: 900 mg via INTRAVENOUS
  Filled 2023-01-01: qty 18

## 2023-01-01 MED FILL — Dexamethasone Sodium Phosphate Inj 100 MG/10ML: INTRAMUSCULAR | Qty: 1 | Status: AC

## 2023-01-02 ENCOUNTER — Encounter: Payer: Self-pay | Admitting: Oncology

## 2023-01-02 ENCOUNTER — Inpatient Hospital Stay: Payer: Medicare Other

## 2023-01-02 ENCOUNTER — Other Ambulatory Visit: Payer: Self-pay

## 2023-01-02 ENCOUNTER — Inpatient Hospital Stay: Payer: Medicare Other | Admitting: Oncology

## 2023-01-02 VITALS — BP 129/71 | HR 55 | Temp 98.0°F | Resp 17

## 2023-01-02 VITALS — BP 122/78 | HR 64 | Temp 97.8°F | Resp 18 | Wt 220.6 lb

## 2023-01-02 DIAGNOSIS — Z5111 Encounter for antineoplastic chemotherapy: Secondary | ICD-10-CM | POA: Diagnosis not present

## 2023-01-02 DIAGNOSIS — Z85038 Personal history of other malignant neoplasm of large intestine: Secondary | ICD-10-CM

## 2023-01-02 DIAGNOSIS — R634 Abnormal weight loss: Secondary | ICD-10-CM

## 2023-01-02 DIAGNOSIS — C786 Secondary malignant neoplasm of retroperitoneum and peritoneum: Secondary | ICD-10-CM

## 2023-01-02 DIAGNOSIS — C184 Malignant neoplasm of transverse colon: Secondary | ICD-10-CM

## 2023-01-02 DIAGNOSIS — T451X5A Adverse effect of antineoplastic and immunosuppressive drugs, initial encounter: Secondary | ICD-10-CM

## 2023-01-02 DIAGNOSIS — Z5112 Encounter for antineoplastic immunotherapy: Secondary | ICD-10-CM | POA: Diagnosis not present

## 2023-01-02 DIAGNOSIS — D6481 Anemia due to antineoplastic chemotherapy: Secondary | ICD-10-CM

## 2023-01-02 LAB — CBC WITH DIFFERENTIAL/PLATELET
Abs Immature Granulocytes: 0.01 10*3/uL (ref 0.00–0.07)
Basophils Absolute: 0 10*3/uL (ref 0.0–0.1)
Basophils Relative: 1 %
Eosinophils Absolute: 0.1 10*3/uL (ref 0.0–0.5)
Eosinophils Relative: 2 %
HCT: 34.1 % — ABNORMAL LOW (ref 39.0–52.0)
Hemoglobin: 11 g/dL — ABNORMAL LOW (ref 13.0–17.0)
Immature Granulocytes: 0 %
Lymphocytes Relative: 50 %
Lymphs Abs: 1.9 10*3/uL (ref 0.7–4.0)
MCH: 29.6 pg (ref 26.0–34.0)
MCHC: 32.3 g/dL (ref 30.0–36.0)
MCV: 91.7 fL (ref 80.0–100.0)
Monocytes Absolute: 0.4 10*3/uL (ref 0.1–1.0)
Monocytes Relative: 11 %
Neutro Abs: 1.4 10*3/uL — ABNORMAL LOW (ref 1.7–7.7)
Neutrophils Relative %: 36 %
Platelets: 177 10*3/uL (ref 150–400)
RBC: 3.72 MIL/uL — ABNORMAL LOW (ref 4.22–5.81)
RDW: 15.2 % (ref 11.5–15.5)
WBC: 3.8 10*3/uL — ABNORMAL LOW (ref 4.0–10.5)
nRBC: 0 % (ref 0.0–0.2)

## 2023-01-02 LAB — COMPREHENSIVE METABOLIC PANEL
ALT: 12 U/L (ref 0–44)
AST: 24 U/L (ref 15–41)
Albumin: 3.2 g/dL — ABNORMAL LOW (ref 3.5–5.0)
Alkaline Phosphatase: 33 U/L — ABNORMAL LOW (ref 38–126)
Anion gap: 5 (ref 5–15)
BUN: 11 mg/dL (ref 8–23)
CO2: 24 mmol/L (ref 22–32)
Calcium: 9.2 mg/dL (ref 8.9–10.3)
Chloride: 104 mmol/L (ref 98–111)
Creatinine, Ser: 0.7 mg/dL (ref 0.61–1.24)
GFR, Estimated: >60 mL/min (ref >60)
Glucose, Bld: 97 mg/dL (ref 70–99)
Potassium: 3.7 mmol/L (ref 3.5–5.1)
Sodium: 133 mmol/L — ABNORMAL LOW (ref 135–145)
Total Bilirubin: 0.7 mg/dL (ref 0.3–1.2)
Total Protein: 6 g/dL — ABNORMAL LOW (ref 6.5–8.1)

## 2023-01-02 LAB — PROTEIN, URINE, RANDOM: Total Protein, Urine: 61 mg/dL

## 2023-01-02 MED ORDER — SODIUM CHLORIDE 0.9 % IV SOLN
10.0000 mg | Freq: Once | INTRAVENOUS | Status: AC
  Administered 2023-01-02: 10 mg via INTRAVENOUS
  Filled 2023-01-02: qty 10

## 2023-01-02 MED ORDER — SODIUM CHLORIDE 0.9 % IV SOLN
5.0000 mg/kg | Freq: Once | INTRAVENOUS | Status: AC
  Administered 2023-01-02: 500 mg via INTRAVENOUS
  Filled 2023-01-02: qty 16

## 2023-01-02 MED ORDER — SODIUM CHLORIDE 0.9 % IV SOLN
Freq: Once | INTRAVENOUS | Status: AC
  Filled 2023-01-02: qty 250

## 2023-01-02 NOTE — Assessment & Plan Note (Signed)
 Hb slight decreased due to chemotherapy. continue to monitor.

## 2023-01-02 NOTE — Assessment & Plan Note (Signed)
Chemotherapy plan as listed above 

## 2023-01-02 NOTE — Patient Instructions (Signed)
[place] [facility]  Discharge Instructions: Thank you for choosing [facility] to provide your oncology and hematology care.  If you have a lab appointment with the Cancer Center, please go directly to the Cancer Center and check in at the registration area.  Wear comfortable clothing and clothing appropriate for easy access to any Portacath or PICC line.   We strive to give you quality time with your provider. You may need to reschedule your appointment if you arrive late (15 or more minutes).  Arriving late affects you and other patients whose appointments are after yours.  Also, if you miss three or more appointments without notifying the office, you may be dismissed from the clinic at the provider's discretion.      For prescription refill requests, have your pharmacy contact our office and allow 72 hours for refills to be completed.    Today you received the following chemotherapy and/or immunotherapy agents bevacizumab, leucovorin, and adrucil      To help prevent nausea and vomiting after your treatment, we encourage you to take your nausea medication as directed.  BELOW ARE SYMPTOMS THAT SHOULD BE REPORTED IMMEDIATELY: *FEVER GREATER THAN 100.4 F (38 C) OR HIGHER *CHILLS OR SWEATING *NAUSEA AND VOMITING THAT IS NOT CONTROLLED WITH YOUR NAUSEA MEDICATION *UNUSUAL SHORTNESS OF BREATH *UNUSUAL BRUISING OR BLEEDING *URINARY PROBLEMS (pain or burning when urinating, or frequent urination) *BOWEL PROBLEMS (unusual diarrhea, constipation, pain near the anus) TENDERNESS IN MOUTH AND THROAT WITH OR WITHOUT PRESENCE OF ULCERS (sore throat, sores in mouth, or a toothache) UNUSUAL RASH, SWELLING OR PAIN  UNUSUAL VAGINAL DISCHARGE OR ITCHING   Items with * indicate a potential emergency and should be followed up as soon as possible or go to the Emergency Department if any problems should occur.  Please show the CHEMOTHERAPY ALERT CARD or IMMUNOTHERAPY  ALERT CARD at check-in to the Emergency Department and triage nurse.  Should you have questions after your visit or need to cancel or reschedule your appointment, please contact [place] [facility]  [phone] and follow the prompts.  Office hours are 8:00 a.m. to 4:30 p.m. Monday - Friday. Please note that voicemails left after 4:00 p.m. may not be returned until the following business day.  We are closed weekends and major holidays. You have access to a nurse at all times for urgent questions. Please call the main number to the clinic [phone] and follow the prompts.  For any non-urgent questions, you may also contact your provider using MyChart. We now offer e-Visits for anyone 40 and older to request care online for non-urgent symptoms. For details visit [email or web address].   Also download the MyChart app! Go to the app store, search "MyChart", open the app, select [place], and log in with your MyChart username and password.

## 2023-01-02 NOTE — Assessment & Plan Note (Signed)
 Encourage protein rich diet.

## 2023-01-02 NOTE — Assessment & Plan Note (Addendum)
Overall he tolerates chemotherapy.   CEA is relatively stable with small fluctuations. CT images shows slight lung progression - refer to Radonc for radiation. I reached out to Dr. Rushie Chestnut, awaiting his opinion. May need PET Labs reviewed and discussed with patient Proceed with 5-FU/bevacizumab treatment today

## 2023-01-02 NOTE — Telephone Encounter (Signed)
Dr. Cathie Hoops discussed with Dr. Rushie Chestnut and will obtain PET first.

## 2023-01-02 NOTE — Progress Notes (Signed)
Hematology/Oncology Progress note [phone] [phone]    Chief Complaint: Jared Gutierrez is a 82 y.o.  male presents for follow-up of metastatic colon cancer   ASSESSMENT & PLAN:   Cancer Staging  Cancer of transverse colon [facility]) Staging form: Colon and Rectum, [name], [name], [name], MD on 10/26/2021   Cancer of transverse colon (HCC) Overall he tolerates chemotherapy.   CEA is relatively stable with small fluctuations. CT images shows slight lung progression - refer to Radonc for radiation. I reached out to Dr. Rushie Chestnut, awaiting his opinion. May need PET Labs reviewed and discussed with patient Proceed with 5-FU/bevacizumab treatment today   Encounter for antineoplastic chemotherapy Chemotherapy plan as listed above.   Anemia due to chemotherapy Hb slight decreased due to chemotherapy. continue to monitor.  Weight loss Encourage protein rich diet.   Follow-up 2 weeks lab MD 5-FU/bevacizumab -   All questions were answered. The patient knows to call the clinic with any problems, questions or concerns.  [name], MD, PhD [facility] Hematology Oncology 01/02/2023     PERTINENT ONCOLOGY HISTORY Jared Gutierrez is a 82 y.o.[place] who has above oncology history reviewed by me today presented for follow up visit for management of Metastatic colon cancer. [name], patient switched care to me on 11/11/20 Extensive medical record review was performed by me  Oncology History  History of colon cancer, stage I  10/02/2014 Pathology Results   [id number] colonoscopy pathology A. CECAL POLYPS; HOT SNARE AND COLD BIOPSY:  - TUBULAR ADENOMA, MULTIPLE FRAGMENTS.  - NEGATIVE FOR HIGH-GRADE DYSPLASIA AND MALIGNANCY.   B. COLON POLYP, TRANSVERSE; COLD BIOPSY:  - TUBULAR ADENOMA.  - NEGATIVE FOR HIGH-GRADE DYSPLASIA AND MALIGNANCY.     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  Tumor extended into the submucosa. Margins were negative. + lymphovascular invasion. 14 lymph nodes were negative. Pathologic stage was T1 N0.  TRANSVERSE COLON, RESECTION:  - INVASIVE ADENOCARCINOMA ARISING IN A 4.6 CM TUBULOVILLOUS  ADENOMA WITH HIGH GRADE DYSPLASIA (MALIGNANT POLYP).  - SEE SUMMARY BELOW.  [name]  ONCOLOGY SUMMARY: COLON AND RECTUM, RESECTION, AJCC 7TH EDITION  Specimen: transverse colon  Procedure: transverse colon resection  Tumor site: splenic flexure  Tumor size: 1.0 cm (invasive component)  Macroscopic tumor perforation: not specified  Histologic type: invasive adenocarcinoma  Histologic grade: moderately differentiated  Microscopic tumor extension: into submucosa  Margins:       Proximal margin: negative       Distal margin: negative       Circumferential (radial) or mesenteric margin: negative       If all  margins uninvolved by invasive carcinoma:       Distance of invasive carcinoma from closest margin: 7.5 cm  to distal margin  Treatment effect: not applicable  Lymph-vascular invasion: present  Perineural invasion: not identified  Tumor deposits (discontinuous extramural  extension): not  identified  Pathologic staging:       Primary tumor: pT1       Regional lymph nodes: pN0            Number of nodes examined: 14            Number of nodes involved: 0    04/02/2019 Progression   04/02/2019  PET scan  limited evealed a 2.4 x 2.3 cm (SUV 11) hypermetabolic soft tissue density caudal and anterior to the pancreatic neck favored to represent isolated peritoneal or nodal metastasis in the setting of prior transverse colonic resection (expected primary drainage). Although this was immediately adjacent to the pancreas, a fat plane was maintained, arguing strongly against a pancreatic primary. Otherwise, there was no evidence of hypermetabolic metastasis. 04/17/2019 EUS on  revealed a normal esophagus, stomach, duodenum, and pancreas.  There was a 2.4 x 2.4 cm irregular mass in the retroperitoneum adjacent to, but not involving the pancreatic neck.  FNA and core needle biopsy were performed.  Pathology revealed adenocarcinoma compatible with a metastatic lesion of colorectal origin.  Tumor cells were positive for CK20 and CDX2 and negative for CK7.  NGS: [employer] on 05/15/2019 revealed + [id number] and TP53.  Negative results included BRAF V600E, Her2, NRAS, NTRK, PD-L1 (<1%), and TMB 8.7/Mb (intermediate).  MMR testing from his colon resection on 10/16/2013 was intact with a low probability of MSI-H.   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  He received Neulasta after cycle #4 and #5 secondary to progressive leukopenia.  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  He received a truncated course of FOLFOX with cycle #11 secondary to oxaliplatin reaction.   01/14/2020 Imaging    PET revealed an interval decrease in size and FDG uptake (2.4 x 2.3 cm with SUV 11 to 2.4 x 1.7 cm with SUV 4.09) associated with the  previously referenced soft tissue density caudal and anterior to the pancreatic neck suggesting treatment response. There were no new sites of FDG avid tumor.   08/31/2020 Imaging   08/31/2020 Abdomen and pelvis CT revealed increased size of the index soft tissue lesion inferior and anterior to the pancreatic neck (2.9 x 1.8 cm to 3.5 x 2.9 cm). There were similar prominent retroperitoneal lymph nodes without adenopathy by size criteria. There was no new or enlarging abdominal or pelvic lymph nodes. There were no new interval findings. There was similar circumferential wall thickening of a nondistended urinary bladder, which likely accentuated wall thickening There was hepatic steatosis and aortic atherosclerosis.   11/03/2020, PET showed recurrent peritoneal metastasis in the upper abdomen adjacent to the pancreas.  The lesion is 3.8 x 2.9 cm with SUV of 11.3. No evidence of metastatic peritoneal disease elsewhere in the abdomen pelvis.  No evidence of distant metastasis.   11/03/2020 Imaging   PET scan showed recurrent peritoneal metastasis near pancreas. Given that he has no other distant metastasis. Discussed with radiation oncology. Repeat SBRT may be considered if he does not respond well to systemic chemotherapy.     11/26/2020 -  Chemotherapy  5-FU and bevacizumab.   02/17/2021 Imaging   02/17/2021 CT abdomen pelvis showed partial response. Mild decrease of peritoneal lesion.  Proceed with 5-FU and bevacizumab today.  Given that he is tolerating current regimen with good life quality, partial response.  Shared decision was made to hold off adding additional chemotherapy agents.   06/08/2021, CT chest abdomen pelvis with contrast showed soft tissue mass at the base of mesentery inferior to the pancreatic neck is unchanged in size.  No progressive disease was identified.   09/07/2021, CT chest abdomen pelvis with contrast showed no substantial changes in size of the mesenteric mass, 2.6 cm.  No  suspicious new lesions.  Chronic findings as detailed in the imaging report.   12/05/2021 Imaging   PET scan showed soft tissue mesenteric mass is unchanged in size.  Decreased FDG uptake compared to activity on November 03, 2020.  Fever treated disease.  No new metastatic disease.   03/21/2022 Imaging   CT chest abdomen pelvis  1. Similar size of the peripancreatic presumed peritoneal implant compared to 09/07/2021. 2. No new or progressive disease. 3. Incidental findings, including: Coronary artery atherosclerosis.Aortic Atherosclerosis (ICD10-I70.0). Prostatomegaly with chronic bladder wall thickening, suggesting outlet obstruction.     07/11/2022 Imaging   CT chest abdomen pelvis with contrast showed 1. New 6 mm nodule in the right upper lobe and 2 mm nodule in the left upper lobe. Although small these are concerning. Recommend short follow-up in 3 months 2. Stable low-attenuation lesion along the midbody of the pancreas. Continued attention on follow-up. No new peritoneal mass lesions or ascites. 3. Fatty liver infiltration 4. Colonic surgical changes   10/03/2022 Imaging   CT chest abdomen pelvis w contrast showed 1. Slight interval enlargement and cavitation of a nodule in the right upper lobe, measuring 0.6 cm, previously 0.5 cm. Slight interval enlargement of a left upper lobe nodule measuring 0.3 cm,previously 0.2 cm. Findings are concerning for slowly enlarging small pulmonary metastases. 2. No significant change in a lobulated soft tissue mass within the central small bowel mesentery abutting the inferior aspect of the pancreas, consistent with a stable, treated metastasis. 3. No other evidence of lymphadenopathy or metastatic disease in the chest, abdomen, or pelvis. 4. Prostatomegaly with diffuse wall thickening of the relatively decompressed urinary bladder, likely secondary to chronic outlet obstruction. 5. Unchanged fibrotic scarring and bronchiectasis of the bilateral lung bases.  This could be further assessed by pulmonary referral and dedicated interstitial lung disease protocol CT examination of the chest if and when clinically appropriate in the setting of known metastatic malignancy. Aortic Atherosclerosis    12/27/2022 Imaging   CT chest abdomen pelvis w contrast showed 1. Continued slight interval enlargement of a cavitary nodule in the right upper lobe measuring 0.7 x 0.6 cm, as well as a nodule in the peripheral left upper lobe measuring 0.5 cm. These are highly suspicious for slowly enlarging pulmonary metastases but remain below size threshold for reliable PET-CT characterization. 2. Unchanged, lobulated hypodense soft tissue mass in the central small bowel mesentery, consistent with treated metastatic disease. 3. Status post transverse colon resection and reanastomosis. 4. Thickening of the distal sigmoid colon and rectum, consistent with nonspecific infectious or inflammatory colitis. 5. Prostatomegaly with diffuse thickening of the urinary bladder wall, likely secondary to chronic outlet obstruction.   Aortic Atherosclerosis (ICD10-I70.0).    Other medical problems Chronic lower extremity edema. 12/24/2018, right lower extremity duplex negative for DVT.  Small right Baker's cyst. 08/16/2019, bilateral lower extremity duplex  showed no DVT. 08/16/2019 - 08/17/2019 with right lower extremity cellulitis.  He was unable to bear weight.  He was treated with IV fluids, NSAIDs, colchicine, and broad antibiotics (vancomycin and Cefepime).  He was discharged on indomethacin x 5 days and Keflex 500 mg TID x 5 days.  10/05/2020, colonoscopy showed internal hemorrhoids.  Otherwise normal examination.  INTERVAL HISTORY BEACHER GIGLIA is a 82 y.o. male who has above history reviewed by me today presents for follow up visit for management of recurrent metastatic colon cancer Patient was accompanied by wife He had abdominal pain after drinking contrast for CT scan. Now  symptom has improved  Denies fever, chills, nausea, vomiting, diarrhea, chest pain, shortness of breath, abdominal pain,  Lost weight. Currently not taking nutrition supplements.   Review of Systems  Constitutional:  Negative for appetite change, chills, diaphoresis, fever and unexpected weight change.  HENT:   Negative for hearing loss, nosebleeds, sore throat and tinnitus.   Respiratory:  Negative for cough, hemoptysis and shortness of breath.   Cardiovascular:  Negative for chest pain and palpitations.  Gastrointestinal:  Negative for abdominal pain, blood in stool, constipation, diarrhea, nausea and vomiting.  Genitourinary:  Negative for dysuria, frequency and hematuria.   Musculoskeletal:  Positive for arthralgias. Negative for back pain, myalgias and neck pain.  Skin:  Negative for itching and rash.  Neurological:  Positive for numbness. Negative for dizziness and headaches.  Hematological:  Does not bruise/bleed easily.  Psychiatric/Behavioral:  Negative for depression. The patient is not nervous/anxious.       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   Chicken pox    Colon cancer (HCC)    History of kidney stones    Hypertension    Lipoma of colon    Nephrolithiasis    Nephrolithiasis    Obesity    Shingles    Tubular adenoma of colon    multiple fragments    Past Surgical History:  Procedure Laterality Date   COLON SURGERY     COLONOSCOPY N/A 10/02/2014   Procedure: COLONOSCOPY;  Surgeon: Elnita Maxwell, MD;  Location: [facility] ENDOSCOPY;  Service: Endoscopy;  Laterality: N/A;   COLONOSCOPY WITH PROPOFOL N/A 02/16/2017   Procedure: COLONOSCOPY WITH PROPOFOL;  Surgeon: Scot Jun, MD;  Location: [facility] ENDOSCOPY;  Service: Endoscopy;  Laterality: N/A;   COLONOSCOPY WITH PROPOFOL N/A 10/05/2020   Procedure: COLONOSCOPY WITH PROPOFOL;  Surgeon: Regis Bill, MD;   Location: [facility] ENDOSCOPY;  Service: Endoscopy;  Laterality: N/A;   EUS N/A 04/17/2019   Procedure: FULL UPPER ENDOSCOPIC ULTRASOUND (EUS) RADIAL;  Surgeon: Bearl Mulberry, MD;  Location[facility] ENDOSCOPY;  Service: Gastroenterology;  Laterality: N/A;   KIDNEY STONE SURGERY     PARTIAL COLECTOMY  10/17/2013   PORTACATH PLACEMENT Right 06/13/2019   Procedure: INSERTION PORT-A-CATH;  Surgeon: Sung Amabile, DO;  Location: [facility] ORS;  Service: General;  Laterality: Right;    Family History  Problem Relation Age of Onset   Cancer Mother    Breast cancer Mother    COPD Father     Social History:  reports that he has never smoked. He has never used smokeless tobacco. He reports current alcohol use. He reports that he does not use drugs.    Allergies: No Known Allergies  Current Medications: Current Outpatient Medications  Medication Sig Dispense Refill  acetaminophen (TYLENOL) 500 MG tablet Take 500 mg by mouth every 6 (six) hours as needed.     amLODipine (NORVASC) 2.5 MG tablet Take 2.5 mg by mouth daily.     aspirin EC 81 MG tablet Take 81 mg by mouth daily.     Calcium Carbonate (CALCIUM 600 PO) Take 1 tablet by mouth 2 (two) times daily.     Cholecalciferol (VITAMIN D) 125 MCG (5000 UT) CAPS Take 5,000 mg by mouth.     docusate sodium (COLACE) 100 MG capsule Take 1 capsule (100 mg total) by mouth 2 (two) times daily as needed for mild constipation or moderate constipation. 60 capsule 3   HYDROcodone-acetaminophen (NORCO/VICODIN) 5-325 MG tablet Take 1 tablet by mouth every 6 (six) hours as needed for moderate pain (up to 3 doses for moderate pain.).     lidocaine-prilocaine (EMLA) cream APPLY TO AFFECTED AREA ONCE AS DIRECTED 30 g 3   loperamide (IMODIUM) 2 MG capsule Take 1 capsule (2 mg total) by mouth See admin instructions. Take 2 tablets after first loose stool,  then 1 tablet  after each loose stool; maximum: 8 tablets /day 60 capsule 0   loratadine (CLARITIN) 10 MG tablet Take  10 mg by mouth daily.     olmesartan (BENICAR) 20 MG tablet Take 1 tablet by mouth daily.     ondansetron (ZOFRAN) 8 MG tablet Take 1 tablet (8 mg total) by mouth 2 (two) times daily as needed for refractory nausea / vomiting. Start on day 3 after chemotherapy. 60 tablet 1   potassium chloride SA (KLOR-CON M20) 20 MEQ tablet Take 1 tablet (20 mEq total) by mouth daily. 180 tablet 1   Probiotic Product (PROBIOTIC DAILY PO) Take 1 tablet by mouth daily.     prochlorperazine (COMPAZINE) 10 MG tablet Take 1 tablet (10 mg total) by mouth every 6 (six) hours as needed (NAUSEA). 30 tablet 1   senna-docusate (SENNA S) 8.6-50 MG tablet Take 2 tablets by mouth daily. 60 tablet 3   No current facility-administered medications for this visit.   Facility-Administered Medications Ordered in Other Visits  Medication Dose Route Frequency Provider Last Rate Last Admin   0.9 %  sodium chloride infusion   Intravenous Once Corcoran, Melissa C, MD       0.9 %  sodium chloride infusion   Intravenous Continuous Rosey Bath, MD 10 mL/hr at 12/31/19 1000 New Bag at 10/05/20 1045   fluorouracil (ADRUCIL) 5,000 mg in sodium chloride 0.9 % 150 mL chemo infusion  2,400 mg/m2 (Treatment Plan Recorded) Intravenous 1 day or 1 dose [name], MD       fluorouracil (ADRUCIL) chemo injection 900 mg  400 mg/m2 (Treatment Plan Recorded) Intravenous Once [name], MD       heparin lock flush 100 unit/mL  500 Units Intravenous Once Rosey Bath, MD       leucovorin 884 mg in sodium chloride 0.9 % 250 mL infusion  400 mg/m2 (Treatment Plan Recorded) Intravenous Once [name], MD        Performance status (ECOG): 1  Vitals Blood pressure 122/78, pulse 64, temperature 97.8 F (36.6 C), temperature source Tympanic, resp. rate 18, weight 220 lb 9.6 oz (100.1 kg), SpO2 100%.   Physical Exam Constitutional:      General: He is not in acute distress.    Appearance: He is obese. He is not diaphoretic.  HENT:     Head:  Normocephalic and atraumatic.  Eyes:  General: No scleral icterus.    Pupils: Pupils are equal, round, and reactive to light.  Cardiovascular:     Rate and Rhythm: Normal rate and regular rhythm.     Heart sounds: No murmur heard. Pulmonary:     Effort: Pulmonary effort is normal. No respiratory distress.     Breath sounds: No wheezing.     Comments: Decreased breath sound bilaterally.  Abdominal:     General: There is no distension.     Palpations: Abdomen is soft.     Tenderness: There is no abdominal tenderness.  Musculoskeletal:        General: Normal range of motion.     Cervical back: Normal range of motion and neck supple.     Comments: Trace edema bilaterally  Skin:    General: Skin is warm and dry.     Findings: No erythema.  Neurological:     Mental Status: He is alert and oriented to person, place, and time. Mental status is at baseline.     Cranial Nerves: No cranial nerve deficit.     Motor: No abnormal muscle tone.  Psychiatric:        Mood and Affect: Mood and affect normal.     Labs were reviewed by me.     Latest Ref Rng & Units 01/02/2023    8:06 AM 12/19/2022    9:11 AM 12/05/2022    8:48 AM  CBC  WBC 4.0 - 10.5 K/uL 3.8  4.8  4.9   Hemoglobin 13.0 - 17.0 g/dL 09.8  11.9  14.7   Hematocrit 39.0 - 52.0 % 34.1  34.6  36.0   Platelets 150 - 400 K/uL 177  164  159       Latest Ref Rng & Units 01/02/2023    8:06 AM 12/19/2022    9:11 AM 12/05/2022    8:48 AM  CMP  Glucose 70 - 99 mg/dL 97  829  562   BUN 8 - 23 mg/dL 11  8  10    Creatinine 0.61 - 1.24 mg/dL 1.30  8.65  7.84   Sodium 135 - 145 mmol/L 133  135  134   Potassium 3.5 - 5.1 mmol/L 3.7  3.4  3.7   Chloride 98 - 111 mmol/L 104  104  104   CO2 22 - 32 mmol/L 24  23  22    Calcium 8.9 - 10.3 mg/dL 9.2  8.9  9.0   Total Protein 6.5 - 8.1 g/dL 6.0  5.8  6.0   Total Bilirubin 0.3 - 1.2 mg/dL 0.7  1.3  0.9   Alkaline Phos 38 - 126 U/L 33  41  40   AST 15 - 41 U/L 24  31  30    ALT 0 - 44 U/L 12   14  16

## 2023-01-03 LAB — CEA: CEA: 19.8 ng/mL — ABNORMAL HIGH (ref 0.0–4.7)

## 2023-01-04 ENCOUNTER — Inpatient Hospital Stay: Payer: Medicare Other

## 2023-01-04 VITALS — BP 130/70 | HR 62 | Temp 97.9°F | Resp 18

## 2023-01-04 DIAGNOSIS — C786 Secondary malignant neoplasm of retroperitoneum and peritoneum: Secondary | ICD-10-CM

## 2023-01-04 DIAGNOSIS — Z85038 Personal history of other malignant neoplasm of large intestine: Secondary | ICD-10-CM

## 2023-01-04 DIAGNOSIS — Z5112 Encounter for antineoplastic immunotherapy: Secondary | ICD-10-CM | POA: Diagnosis not present

## 2023-01-04 MED ORDER — HEPARIN SOD (PORK) LOCK FLUSH 100 UNIT/ML IV SOLN
500.0000 [IU] | Freq: Once | INTRAVENOUS | Status: AC | PRN
  Administered 2023-01-04: 500 [IU]
  Filled 2023-01-04: qty 5

## 2023-01-04 MED ORDER — SODIUM CHLORIDE 0.9% FLUSH
10.0000 mL | INTRAVENOUS | Status: DC | PRN
  Administered 2023-01-04: 10 mL
  Filled 2023-01-04: qty 10

## 2023-01-09 ENCOUNTER — Encounter: Payer: Self-pay | Admitting: Oncology

## 2023-01-09 ENCOUNTER — Ambulatory Visit
Admission: RE | Admit: 2023-01-09 | Discharge: 2023-01-09 | Disposition: A | Discharge status: Home / Self Care | Payer: Medicare Other | Source: Ambulatory Visit | Attending: Oncology | Admitting: Oncology

## 2023-01-09 DIAGNOSIS — C184 Malignant neoplasm of transverse colon: Secondary | ICD-10-CM | POA: Insufficient documentation

## 2023-01-09 DIAGNOSIS — Z9049 Acquired absence of other specified parts of digestive tract: Secondary | ICD-10-CM | POA: Insufficient documentation

## 2023-01-09 DIAGNOSIS — R918 Other nonspecific abnormal finding of lung field: Secondary | ICD-10-CM | POA: Insufficient documentation

## 2023-01-09 DIAGNOSIS — C772 Secondary and unspecified malignant neoplasm of intra-abdominal lymph nodes: Secondary | ICD-10-CM | POA: Insufficient documentation

## 2023-01-09 LAB — GLUCOSE, CAPILLARY: Glucose-Capillary: 87 mg/dL (ref 70–99)

## 2023-01-09 MED ORDER — FLUDEOXYGLUCOSE F - 18 (FDG) INJECTION
11.7800 | Freq: Once | INTRAVENOUS | Status: AC | PRN
  Administered 2023-01-09: 11.78 via INTRAVENOUS

## 2023-01-16 ENCOUNTER — Encounter: Payer: Self-pay | Admitting: Radiation Oncology

## 2023-01-16 ENCOUNTER — Encounter: Payer: Self-pay | Admitting: Oncology

## 2023-01-16 ENCOUNTER — Inpatient Hospital Stay (HOSPITAL_BASED_OUTPATIENT_CLINIC_OR_DEPARTMENT_OTHER): Payer: Medicare Other | Admitting: Oncology

## 2023-01-16 ENCOUNTER — Inpatient Hospital Stay: Payer: Medicare Other

## 2023-01-16 ENCOUNTER — Ambulatory Visit
Admission: RE | Admit: 2023-01-16 | Discharge: 2023-01-16 | Disposition: A | Discharge status: Home / Self Care | Payer: Medicare Other | Source: Ambulatory Visit | Attending: Radiation Oncology | Admitting: Radiation Oncology

## 2023-01-16 VITALS — BP 113/72 | HR 68 | Temp 97.4°F | Resp 12

## 2023-01-16 VITALS — BP 113/72 | HR 68 | Temp 97.4°F | Resp 12 | Wt 222.8 lb

## 2023-01-16 DIAGNOSIS — C786 Secondary malignant neoplasm of retroperitoneum and peritoneum: Secondary | ICD-10-CM | POA: Insufficient documentation

## 2023-01-16 DIAGNOSIS — Z79899 Other long term (current) drug therapy: Secondary | ICD-10-CM | POA: Insufficient documentation

## 2023-01-16 DIAGNOSIS — Z809 Family history of malignant neoplasm, unspecified: Secondary | ICD-10-CM | POA: Insufficient documentation

## 2023-01-16 DIAGNOSIS — Z85038 Personal history of other malignant neoplasm of large intestine: Secondary | ICD-10-CM

## 2023-01-16 DIAGNOSIS — Z5111 Encounter for antineoplastic chemotherapy: Secondary | ICD-10-CM | POA: Insufficient documentation

## 2023-01-16 DIAGNOSIS — R600 Localized edema: Secondary | ICD-10-CM | POA: Insufficient documentation

## 2023-01-16 DIAGNOSIS — Z5112 Encounter for antineoplastic immunotherapy: Secondary | ICD-10-CM | POA: Insufficient documentation

## 2023-01-16 DIAGNOSIS — Z86010 Personal history of colonic polyps: Secondary | ICD-10-CM | POA: Insufficient documentation

## 2023-01-16 DIAGNOSIS — T451X5A Adverse effect of antineoplastic and immunosuppressive drugs, initial encounter: Secondary | ICD-10-CM

## 2023-01-16 DIAGNOSIS — C184 Malignant neoplasm of transverse colon: Secondary | ICD-10-CM | POA: Insufficient documentation

## 2023-01-16 DIAGNOSIS — R918 Other nonspecific abnormal finding of lung field: Secondary | ICD-10-CM | POA: Insufficient documentation

## 2023-01-16 DIAGNOSIS — Z7982 Long term (current) use of aspirin: Secondary | ICD-10-CM | POA: Insufficient documentation

## 2023-01-16 DIAGNOSIS — Z452 Encounter for adjustment and management of vascular access device: Secondary | ICD-10-CM | POA: Insufficient documentation

## 2023-01-16 DIAGNOSIS — T451X5D Adverse effect of antineoplastic and immunosuppressive drugs, subsequent encounter: Secondary | ICD-10-CM | POA: Insufficient documentation

## 2023-01-16 DIAGNOSIS — D6481 Anemia due to antineoplastic chemotherapy: Secondary | ICD-10-CM | POA: Insufficient documentation

## 2023-01-16 DIAGNOSIS — E669 Obesity, unspecified: Secondary | ICD-10-CM | POA: Insufficient documentation

## 2023-01-16 DIAGNOSIS — Z510 Encounter for antineoplastic radiation therapy: Secondary | ICD-10-CM | POA: Diagnosis present

## 2023-01-16 DIAGNOSIS — R634 Abnormal weight loss: Secondary | ICD-10-CM | POA: Insufficient documentation

## 2023-01-16 DIAGNOSIS — K648 Other hemorrhoids: Secondary | ICD-10-CM | POA: Insufficient documentation

## 2023-01-16 DIAGNOSIS — Z803 Family history of malignant neoplasm of breast: Secondary | ICD-10-CM | POA: Insufficient documentation

## 2023-01-16 DIAGNOSIS — Z836 Family history of other diseases of the respiratory system: Secondary | ICD-10-CM | POA: Insufficient documentation

## 2023-01-16 LAB — COMPREHENSIVE METABOLIC PANEL
ALT: 13 U/L (ref 0–44)
AST: 24 U/L (ref 15–41)
Albumin: 3.6 g/dL (ref 3.5–5.0)
Alkaline Phosphatase: 38 U/L (ref 38–126)
Anion gap: 7 (ref 5–15)
BUN: 9 mg/dL (ref 8–23)
CO2: 25 mmol/L (ref 22–32)
Calcium: 9.3 mg/dL (ref 8.9–10.3)
Chloride: 103 mmol/L (ref 98–111)
Creatinine, Ser: 0.79 mg/dL (ref 0.61–1.24)
GFR, Estimated: >60 mL/min (ref >60)
Glucose, Bld: 98 mg/dL (ref 70–99)
Potassium: 3.6 mmol/L (ref 3.5–5.1)
Sodium: 135 mmol/L (ref 135–145)
Total Bilirubin: 1.1 mg/dL (ref 0.3–1.2)
Total Protein: 6 g/dL — ABNORMAL LOW (ref 6.5–8.1)

## 2023-01-16 LAB — CBC WITH DIFFERENTIAL/PLATELET
Abs Immature Granulocytes: 0.01 10*3/uL (ref 0.00–0.07)
Basophils Absolute: 0.1 10*3/uL (ref 0.0–0.1)
Basophils Relative: 1 %
Eosinophils Absolute: 0.1 10*3/uL (ref 0.0–0.5)
Eosinophils Relative: 1 %
HCT: 34.8 % — ABNORMAL LOW (ref 39.0–52.0)
Hemoglobin: 11.2 g/dL — ABNORMAL LOW (ref 13.0–17.0)
Immature Granulocytes: 0 %
Lymphocytes Relative: 30 %
Lymphs Abs: 1.6 10*3/uL (ref 0.7–4.0)
MCH: 29.9 pg (ref 26.0–34.0)
MCHC: 32.2 g/dL (ref 30.0–36.0)
MCV: 93 fL (ref 80.0–100.0)
Monocytes Absolute: 0.6 10*3/uL (ref 0.1–1.0)
Monocytes Relative: 11 %
Neutro Abs: 2.9 10*3/uL (ref 1.7–7.7)
Neutrophils Relative %: 57 %
Platelets: 189 10*3/uL (ref 150–400)
RBC: 3.74 MIL/uL — ABNORMAL LOW (ref 4.22–5.81)
RDW: 15.6 % — ABNORMAL HIGH (ref 11.5–15.5)
WBC: 5.2 10*3/uL (ref 4.0–10.5)
nRBC: 0 % (ref 0.0–0.2)

## 2023-01-16 LAB — PROTEIN, URINE, RANDOM: Total Protein, Urine: 120 mg/dL

## 2023-01-16 MED ORDER — HEPARIN SOD (PORK) LOCK FLUSH 100 UNIT/ML IV SOLN
500.0000 [IU] | Freq: Once | INTRAVENOUS | Status: DC | PRN
  Filled 2023-01-16: qty 5

## 2023-01-16 MED ORDER — FLUOROURACIL CHEMO INJECTION 2.5 GM/50ML
400.0000 mg/m2 | Freq: Once | INTRAVENOUS | Status: AC
  Administered 2023-01-16: 900 mg via INTRAVENOUS
  Filled 2023-01-16: qty 18

## 2023-01-16 MED ORDER — SODIUM CHLORIDE 0.9 % IV SOLN
Freq: Once | INTRAVENOUS | Status: AC
  Filled 2023-01-16: qty 250

## 2023-01-16 MED ORDER — SODIUM CHLORIDE 0.9 % IV SOLN
5.0000 mg/kg | Freq: Once | INTRAVENOUS | Status: AC
  Administered 2023-01-16: 500 mg via INTRAVENOUS
  Filled 2023-01-16: qty 16

## 2023-01-16 MED ORDER — SODIUM CHLORIDE 0.9 % IV SOLN
10.0000 mg | Freq: Once | INTRAVENOUS | Status: AC
  Administered 2023-01-16: 10 mg via INTRAVENOUS
  Filled 2023-01-16: qty 10

## 2023-01-16 MED ORDER — SODIUM CHLORIDE 0.9 % IV SOLN
400.0000 mg/m2 | Freq: Once | INTRAVENOUS | Status: AC
  Administered 2023-01-16: 884 mg via INTRAVENOUS
  Filled 2023-01-16: qty 44.2

## 2023-01-16 MED ORDER — SODIUM CHLORIDE 0.9 % IV SOLN
2400.0000 mg/m2 | INTRAVENOUS | Status: DC
  Administered 2023-01-16: 5000 mg via INTRAVENOUS
  Filled 2023-01-16: qty 100

## 2023-01-16 NOTE — Progress Notes (Signed)
Radiation Oncology Follow up Note  Name: Jared Gutierrez   Date:   01/16/2023 MRN:  [id number] DOB: 12/19/1940    This 82 y.o. male presents to the clinic today for progressive abdominal mesenteric mass and patient with known stage IV colon cancer.  REFERRING PROVIDER: Barbette Reichmann, MD  HPI: Patient is a 82 year old male well-known to our department having been treated back in 2021 for colonic metastasis in the region of the anterior caudal region of the pancreatic neck treated with SBRT.  He is seen today for PET scan results which shows a progressive enlarging mass in the mesentery consistent with again known metastatic colon cancer..  This mass measures 4.2 cm in the central abdominal mesentery.  He also has an additional 8 mm short axis left periaortic nodal metastasis.  He has a 7 mm cavitary right upper lobe nodule and 5 mm left upper lobe nodule both beneath the size threshold for PET sensitivity but suspicious for small pulmonary metastasis.  Patient up from abdominal standpoint is doing fairly well he states does have abdominal pain which he relates to constipation.  COMPLICATIONS OF TREATMENT: none  FOLLOW UP COMPLIANCE: keeps appointments   PHYSICAL EXAM:  BP 113/72   Pulse 68   Temp (!) 97.4 F (36.3 C) (Tympanic)   Resp 12  Wheelchair-bound male in NAD.  Well-developed well-nourished patient in NAD. HEENT reveals PERLA, EOMI, discs not visualized.  Oral cavity is clear. No oral mucosal lesions are identified. Neck is clear without evidence of cervical or supraclavicular adenopathy. Lungs are clear to A&P. Cardiac examination is essentially unremarkable with regular rate and rhythm without murmur rub or thrill. Abdomen is benign with no organomegaly or masses noted. Motor sensory and DTR levels are equal and symmetric in the upper and lower extremities. Cranial nerves II through XII are grossly intact. Proprioception is intact. No peripheral adenopathy or edema is identified. No  motor or sensory levels are noted. Crude visual fields are within normal range.  RADIOLOGY RESULTS: CT scans PET CT scans reviewed compatible with above-stated findings  PLAN: At this time I would like to concentrate on the over 4 cm mesenteric mass.  I believe this is too large for SBRT.  I would plan on delivering 45 Gray in 25 fractions.  I would use IMRT treatment planning and delivery to protect the small bowel spinal cord previously radiated site.  I have also discussed with medical oncology concurrent 5-FU chemotherapy concomitantly with radiation.  Risks and benefits of treatment including possible diarrhea fatigue alteration blood counts skin reaction all were discussed in detail with the patient and his wife.  They both seem to comprehend my treatment plan well.  I personally set up and ordered CT simulation.    I would like to take this opportunity to thank you for allowing me to participate in the care of your patient.Carmina Miller, MD

## 2023-01-16 NOTE — Assessment & Plan Note (Signed)
Slow growing small lung nodule, discussed with Dr. Rushie Chestnut.  Will hold off radiation and continue close monitor.

## 2023-01-16 NOTE — Assessment & Plan Note (Signed)
Chemotherapy plan as listed above 

## 2023-01-16 NOTE — Assessment & Plan Note (Signed)
 Encourage protein rich diet.

## 2023-01-16 NOTE — Patient Instructions (Signed)
[facility]  Discharge Instructions: Thank you for choosing [facility] to provide your oncology and hematology care.  If you have a lab appointment with the Cancer Center, please go directly to the Cancer Center and check in at the registration area.  Wear comfortable clothing and clothing appropriate for easy access to any Portacath or PICC line.   We strive to give you quality time with your provider. You may need to reschedule your appointment if you arrive late (15 or more minutes).  Arriving late affects you and other patients whose appointments are after yours.  Also, if you miss three or more appointments without notifying the office, you may be dismissed from the clinic at the provider's discretion.      For prescription refill requests, have your pharmacy contact our office and allow 72 hours for refills to be completed.    Today you received the following chemotherapy and/or immunotherapy agents Vegzelma, Leucovorin & adrucil      To help prevent nausea and vomiting after your treatment, we encourage you to take your nausea medication as directed.  BELOW ARE SYMPTOMS THAT SHOULD BE REPORTED IMMEDIATELY: *FEVER GREATER THAN 100.4 F (38 C) OR HIGHER *CHILLS OR SWEATING *NAUSEA AND VOMITING THAT IS NOT CONTROLLED WITH YOUR NAUSEA MEDICATION *UNUSUAL SHORTNESS OF BREATH *UNUSUAL BRUISING OR BLEEDING *URINARY PROBLEMS (pain or burning when urinating, or frequent urination) *BOWEL PROBLEMS (unusual diarrhea, constipation, pain near the anus) TENDERNESS IN MOUTH AND THROAT WITH OR WITHOUT PRESENCE OF ULCERS (sore throat, sores in mouth, or a toothache) UNUSUAL RASH, SWELLING OR PAIN  UNUSUAL VAGINAL DISCHARGE OR ITCHING   Items with * indicate a potential emergency and should be followed up as soon as possible or go to the Emergency Department if any problems should occur.  Please show the CHEMOTHERAPY ALERT CARD or IMMUNOTHERAPY ALERT  CARD at check-in to the Emergency Department and triage nurse.  Should you have questions after your visit or need to cancel or reschedule your appointment, please contact [facility]  [phone] and follow the prompts.  Office hours are 8:00 a.m. to 4:30 p.m. Monday - Friday. Please note that voicemails left after 4:00 p.m. may not be returned until the following business day.  We are closed weekends and major holidays. You have access to a nurse at all times for urgent questions. Please call the main number to the clinic [phone] and follow the prompts.  For any non-urgent questions, you may also contact your provider using MyChart. We now offer e-Visits for anyone 15 and older to request care online for non-urgent symptoms. For details visit [email or web address].   Also download the MyChart app! Go to the app store, search "MyChart", open the app, select , and log in with your MyChart username and password.

## 2023-01-16 NOTE — Assessment & Plan Note (Addendum)
Overall he tolerates chemotherapy.   CEA is relatively stable with small fluctuations. CT images shows slight lung progression - refer to Radonc for radiation. I reached out to Dr. Rushie Chestnut, awaiting his opinion. May need PET Labs reviewed and discussed with patient Proceed with 5-FU/bevacizumab treatment today Discussed with Dr. Rushie Chestnut, patient will start radiation to his mesenteric soft tissue mass next week.  I will hold off bevacizumab, continue 5-FU.  Discussed with patient and wife regarding potential increased toxicity from concurrent chemotherapy and radiation.  They agree with this plan.

## 2023-01-16 NOTE — Assessment & Plan Note (Signed)
 Hb slight decreased due to chemotherapy. continue to monitor.

## 2023-01-16 NOTE — Progress Notes (Signed)
Hematology/Oncology Progress note [phone] Fax:([phone]) [phone]    Chief Complaint: Jared Gutierrez is a 82 y.o.  male presents for follow-up of metastatic colon cancer   ASSESSMENT & PLAN:   Cancer Staging  Cancer of transverse colon [facility]) Staging form: Colon and Rectum, [name], [name], [name], MD on 10/26/2021   Cancer of transverse colon (HCC) Overall he tolerates chemotherapy.   CEA is relatively stable with small fluctuations. CT images shows slight lung progression - refer to Radonc for radiation. I reached out to Dr. Rushie Chestnut, awaiting his opinion. May need PET Labs reviewed and discussed with patient Proceed with 5-FU/bevacizumab treatment today Discussed with Dr. Rushie Chestnut, patient will start radiation to his mesenteric soft tissue mass next week.  I will hold off bevacizumab, continue 5-FU.  Discussed with patient and wife regarding potential increased toxicity from concurrent chemotherapy and radiation.  They agree with this plan.  Encounter for antineoplastic chemotherapy Chemotherapy plan as listed above.   Anemia due to chemotherapy Hb slight decreased due to chemotherapy. continue to monitor.  Weight loss Encourage protein rich diet.   Lung nodules Slow growing small lung nodule, discussed with Dr. Rushie Chestnut.  Will hold off radiation and continue close monitor.  Follow-up 2 weeks lab MD 5-FU/bevacizumab -   All questions were answered. The patient knows to call the clinic with any problems, questions or concerns.  [name], MD, PhD [facility] Hematology Oncology 01/16/2023     PERTINENT ONCOLOGY HISTORY Jared Gutierrez is a 82 y.o.[place] who has above oncology history reviewed by me today presented for follow up visit for management of Metastatic colon cancer. [name], patient switched care to me on 11/11/20 Extensive medical record review was  performed by me  Oncology History  History of colon cancer, stage I  10/02/2014 Pathology Results   [id number] colonoscopy pathology A. CECAL POLYPS; HOT SNARE AND COLD BIOPSY:  - TUBULAR ADENOMA, MULTIPLE FRAGMENTS.  - NEGATIVE FOR HIGH-GRADE DYSPLASIA AND MALIGNANCY.   B. COLON POLYP, TRANSVERSE; COLD BIOPSY:  - TUBULAR ADENOMA.  - NEGATIVE FOR HIGH-GRADE DYSPLASIA AND MALIGNANCY.     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  Tumor extended into the submucosa. Margins were negative. + lymphovascular invasion. 14 lymph nodes were negative. Pathologic stage was T1 N0.  TRANSVERSE COLON, RESECTION:  - INVASIVE ADENOCARCINOMA ARISING IN A 4.6 CM TUBULOVILLOUS  ADENOMA WITH HIGH GRADE DYSPLASIA (MALIGNANT POLYP).  - SEE SUMMARY BELOW.  [name]  ONCOLOGY SUMMARY: COLON AND RECTUM, RESECTION, AJCC 7TH EDITION  Specimen: transverse colon  Procedure: transverse colon resection  Tumor site: splenic flexure  Tumor size: 1.0  cm (invasive component)  Macroscopic tumor perforation: not specified  Histologic type: invasive adenocarcinoma  Histologic grade: moderately differentiated  Microscopic tumor extension: into submucosa  Margins:        Proximal margin: negative       Distal margin: negative       Circumferential (radial) or mesenteric margin: negative       If all margins uninvolved by invasive carcinoma:       Distance of invasive carcinoma from closest margin: 7.5 cm  to distal margin  Treatment effect: not applicable  Lymph-vascular invasion: present  Perineural invasion: not identified  Tumor deposits (discontinuous extramural extension): not  identified  Pathologic staging:       Primary tumor: pT1       Regional lymph nodes: pN0            Number of nodes examined: 14            Number of nodes involved: 0    04/02/2019 Progression   04/02/2019  PET scan  limited evealed a 2.4 x 2.3 cm (SUV 11) hypermetabolic soft tissue density caudal and anterior to the pancreatic neck favored to represent isolated peritoneal or nodal metastasis in the setting of prior transverse colonic resection (expected primary drainage). Although this was immediately adjacent to the pancreas, a fat plane was maintained, arguing strongly against a pancreatic primary. Otherwise, there was no evidence of hypermetabolic metastasis. 04/17/2019 EUS on  revealed a normal esophagus, stomach, duodenum, and pancreas.  There was a 2.4 x 2.4 cm irregular mass in the retroperitoneum adjacent to, but not involving the pancreatic neck.  FNA and core needle biopsy were performed.  Pathology revealed adenocarcinoma compatible with a metastatic lesion of colorectal origin.  Tumor cells were positive for CK20 and CDX2 and negative for CK7.  NGS: [employer] on 05/15/2019 revealed + [id number] and TP53.  Negative results included BRAF V600E, Her2, NRAS, NTRK, PD-L1 (<1%), and TMB 8.7/Mb (intermediate).  MMR testing from his colon resection on 10/16/2013 was intact with a low probability of MSI-H.   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  He received Neulasta after cycle #4 and #5 secondary to progressive leukopenia.  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  He received a truncated course of FOLFOX with cycle #11 secondary to oxaliplatin reaction.   01/14/2020 Imaging    PET revealed an interval decrease in size and FDG uptake (2.4 x 2.3 cm with SUV 11 to 2.4 x 1.7 cm with SUV 4.09) associated with the previously referenced soft tissue density caudal and anterior to the pancreatic neck suggesting treatment response. There were no new sites of FDG avid tumor.   08/31/2020 Imaging   08/31/2020 Abdomen and pelvis CT revealed increased size of the index soft tissue lesion inferior and anterior to the pancreatic neck (2.9 x 1.8 cm to 3.5 x 2.9 cm). There were similar prominent retroperitoneal lymph nodes without adenopathy by size criteria. There was no new or enlarging abdominal or pelvic lymph nodes. There were no new interval findings. There was similar circumferential wall thickening of a nondistended urinary bladder, which likely accentuated wall thickening There was hepatic steatosis and aortic atherosclerosis.   11/03/2020, PET showed recurrent peritoneal metastasis in the upper abdomen adjacent to the pancreas.  The lesion is 3.8 x 2.9 cm with SUV of 11.3.  No evidence of metastatic peritoneal disease elsewhere in the abdomen pelvis.  No evidence of distant metastasis.   11/03/2020 Imaging   PET scan showed recurrent peritoneal metastasis near pancreas. Given that he has no other distant metastasis. Discussed with radiation oncology. Repeat SBRT may be considered if he does not respond well to systemic chemotherapy.     11/26/2020 -  Chemotherapy   5-FU and bevacizumab.   02/17/2021 Imaging   02/17/2021 CT abdomen pelvis showed partial response. Mild decrease of peritoneal lesion.  Proceed with 5-FU and bevacizumab today.  Given that he is tolerating current regimen with good life quality,  partial response.  Shared decision was made to hold off adding additional chemotherapy agents.   06/08/2021, CT chest abdomen pelvis with contrast showed soft tissue mass at the base of mesentery inferior to the pancreatic neck is unchanged in size.  No progressive disease was identified.   09/07/2021, CT chest abdomen pelvis with contrast showed no substantial changes in size of the mesenteric mass, 2.6 cm.  No suspicious new lesions.  Chronic findings as detailed in the imaging report.   12/05/2021 Imaging   PET scan showed soft tissue mesenteric mass is unchanged in size.  Decreased FDG uptake compared to activity on November 03, 2020.  Fever treated disease.  No new metastatic disease.   03/21/2022 Imaging   CT chest abdomen pelvis  1. Similar size of the peripancreatic presumed peritoneal implant compared to 09/07/2021. 2. No new or progressive disease. 3. Incidental findings, including: Coronary artery atherosclerosis.Aortic Atherosclerosis (ICD10-I70.0). Prostatomegaly with chronic bladder wall thickening, suggesting outlet obstruction.     07/11/2022 Imaging   CT chest abdomen pelvis with contrast showed 1. New 6 mm nodule in the right upper lobe and 2 mm nodule in the left upper lobe. Although small these are concerning. Recommend short follow-up in 3 months 2. Stable low-attenuation lesion along the midbody of the pancreas. Continued attention on follow-up. No new peritoneal mass lesions or ascites. 3. Fatty liver infiltration 4. Colonic surgical changes   10/03/2022 Imaging   CT chest abdomen pelvis w contrast showed 1. Slight interval enlargement and cavitation of a nodule in the right upper lobe, measuring 0.6 cm, previously 0.5 cm. Slight interval enlargement of a left upper lobe nodule measuring 0.3 cm,previously 0.2 cm. Findings are concerning for slowly enlarging small pulmonary metastases. 2. No significant change in a lobulated soft tissue mass within the central small bowel  mesentery abutting the inferior aspect of the pancreas, consistent with a stable, treated metastasis. 3. No other evidence of lymphadenopathy or metastatic disease in the chest, abdomen, or pelvis. 4. Prostatomegaly with diffuse wall thickening of the relatively decompressed urinary bladder, likely secondary to chronic outlet obstruction. 5. Unchanged fibrotic scarring and bronchiectasis of the bilateral lung bases. This could be further assessed by pulmonary referral and dedicated interstitial lung disease protocol CT examination of the chest if and when clinically appropriate in the setting of known metastatic malignancy. Aortic Atherosclerosis    12/27/2022 Imaging   CT chest abdomen pelvis w contrast showed 1. Continued slight interval enlargement of a cavitary nodule in the right upper lobe measuring 0.7 x 0.6 cm, as well as a nodule in the peripheral left upper lobe measuring 0.5 cm. These are highly suspicious for slowly enlarging pulmonary metastases but remain below size threshold for reliable PET-CT characterization. 2. Unchanged, lobulated hypodense soft tissue mass in the central small bowel mesentery, consistent with treated metastatic disease. 3. Status post transverse colon resection  and reanastomosis. 4. Thickening of the distal sigmoid colon and rectum, consistent with nonspecific infectious or inflammatory colitis. 5. Prostatomegaly with diffuse thickening of the urinary bladder wall, likely secondary to chronic outlet obstruction.   Aortic Atherosclerosis (ICD10-I70.0).   01/09/2023 Imaging   PET scan showed Status post transverse colectomy. 4.2 cm soft tissue mass in the central abdominal mesentery, suspicious for residual/recurrent nodal metastasis. Additional 8 mm short axis left para-aortic nodal metastasis. 7 mm cavitary right upper lobe nodule and 5 mm left upper lobe nodule, both beneath the size threshold for PET sensitivity, but suspicious for small pulmonary  metastases.    Other medical problems Chronic lower extremity edema. 12/24/2018, right lower extremity duplex negative for DVT.  Small right Baker's cyst. 08/16/2019, bilateral lower extremity duplex showed no DVT. 08/16/2019 - 08/17/2019 with right lower extremity cellulitis.  He was unable to bear weight.  He was treated with IV fluids, NSAIDs, colchicine, and broad antibiotics (vancomycin and Cefepime).  He was discharged on indomethacin x 5 days and Keflex 500 mg TID x 5 days.  10/05/2020, colonoscopy showed internal hemorrhoids.  Otherwise normal examination.  INTERVAL HISTORY Jared Gutierrez is a 82 y.o. male who has above history reviewed by me today presents for follow up visit for management of recurrent metastatic colon cancer Patient was accompanied by wife Denies fever, chills, nausea, vomiting, diarrhea, chest pain, shortness of breath, abdominal pain,  He has gained 2 pounds since last visit.  Review of Systems  Constitutional:  Negative for appetite change, chills, diaphoresis, fever and unexpected weight change.  HENT:   Negative for hearing loss, nosebleeds, sore throat and tinnitus.   Respiratory:  Negative for cough, hemoptysis and shortness of breath.   Cardiovascular:  Negative for chest pain and palpitations.  Gastrointestinal:  Negative for abdominal pain, blood in stool, constipation, diarrhea, nausea and vomiting.  Genitourinary:  Negative for dysuria, frequency and hematuria.   Musculoskeletal:  Positive for arthralgias. Negative for back pain, myalgias and neck pain.  Skin:  Negative for itching and rash.  Neurological:  Positive for numbness. Negative for dizziness and headaches.  Hematological:  Does not bruise/bleed easily.  Psychiatric/Behavioral:  Negative for depression. The patient is not nervous/anxious.       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    Chicken pox    Colon cancer (HCC)    History of kidney stones    Hypertension    Lipoma of colon    Nephrolithiasis    Nephrolithiasis    Obesity    Shingles    Tubular adenoma of colon    multiple fragments    Past Surgical History:  Procedure Laterality Date   COLON SURGERY     COLONOSCOPY N/A 10/02/2014   Procedure: COLONOSCOPY;  Surgeon: Elnita Maxwell, MD;  Location: [facility] ENDOSCOPY;  Service: Endoscopy;  Laterality: N/A;   COLONOSCOPY WITH PROPOFOL N/A 02/16/2017   Procedure: COLONOSCOPY WITH PROPOFOL;  Surgeon: Scot Jun, MD;  Location: [facility] ENDOSCOPY;  Service: Endoscopy;  Laterality: N/A;   COLONOSCOPY WITH PROPOFOL N/A 10/05/2020   Procedure: COLONOSCOPY WITH PROPOFOL;  Surgeon: Regis Bill, MD;  Location: [facility] ENDOSCOPY;  Service: Endoscopy;  Laterality: N/A;   EUS N/A 04/17/2019   Procedure: FULL UPPER ENDOSCOPIC ULTRASOUND (EUS) RADIAL;  Surgeon: Bearl Mulberry, MD;  Location: [facility] ENDOSCOPY;  Service: Gastroenterology;  Laterality:  N/A;   KIDNEY STONE SURGERY     PARTIAL COLECTOMY  10/17/2013   PORTACATH PLACEMENT Right 06/13/2019   Procedure: INSERTION PORT-A-CATH;  Surgeon: Sung Amabile, DO;  Location: [facility] ORS;  Service: General;  Laterality: Right;    Family History  Problem Relation Age of Onset   Cancer Mother    Breast cancer Mother    COPD Father     Social History:  reports that he has never smoked. He has never used smokeless tobacco. He reports current alcohol use. He reports that he does not use drugs.    Allergies: No Known Allergies  Current Medications: Current Outpatient Medications  Medication Sig Dispense Refill   acetaminophen (TYLENOL) 500 MG tablet Take 500 mg by mouth every 6 (six) hours as needed.     amLODipine (NORVASC) 2.5 MG tablet Take 2.5 mg by mouth daily.     aspirin EC 81 MG tablet Take 81 mg by mouth daily.     Calcium Carbonate (CALCIUM 600 PO) Take 1 tablet by mouth 2 (two) times daily.      Cholecalciferol (VITAMIN D) 125 MCG (5000 UT) CAPS Take 5,000 mg by mouth.     docusate sodium (COLACE) 100 MG capsule Take 1 capsule (100 mg total) by mouth 2 (two) times daily as needed for mild constipation or moderate constipation. 60 capsule 3   lidocaine-prilocaine (EMLA) cream APPLY TO AFFECTED AREA ONCE AS DIRECTED 30 g 3   loperamide (IMODIUM) 2 MG capsule Take 1 capsule (2 mg total) by mouth See admin instructions. Take 2 tablets after first loose stool,  then 1 tablet  after each loose stool; maximum: 8 tablets /day 60 capsule 0   olmesartan (BENICAR) 20 MG tablet Take 1 tablet by mouth daily.     ondansetron (ZOFRAN) 8 MG tablet Take 1 tablet (8 mg total) by mouth 2 (two) times daily as needed for refractory nausea / vomiting. Start on day 3 after chemotherapy. 60 tablet 1   potassium chloride SA (KLOR-CON M20) 20 MEQ tablet Take 1 tablet (20 mEq total) by mouth daily. 180 tablet 1   Probiotic Product (PROBIOTIC DAILY PO) Take 1 tablet by mouth daily.     prochlorperazine (COMPAZINE) 10 MG tablet Take 1 tablet (10 mg total) by mouth every 6 (six) hours as needed (NAUSEA). 30 tablet 1   senna-docusate (SENNA S) 8.6-50 MG tablet Take 2 tablets by mouth daily. 60 tablet 3   HYDROcodone-acetaminophen (NORCO/VICODIN) 5-325 MG tablet Take 1 tablet by mouth every 6 (six) hours as needed for moderate pain (up to 3 doses for moderate pain.). (Patient not taking: Reported on 01/16/2023)     loratadine (CLARITIN) 10 MG tablet Take 10 mg by mouth daily. (Patient not taking: Reported on 01/16/2023)     No current facility-administered medications for this visit.   Facility-Administered Medications Ordered in Other Visits  Medication Dose Route Frequency Provider Last Rate Last Admin   0.9 %  sodium chloride infusion   Intravenous Once Corcoran, Melissa C, MD       0.9 %  sodium chloride infusion   Intravenous Continuous Merlene Pulling, Melissa C, MD 10 mL/hr at 12/31/19 1000 New Bag at 10/05/20 1045    fluorouracil (ADRUCIL) 5,000 mg in sodium chloride 0.9 % 150 mL chemo infusion  2,400 mg/m2 (Treatment Plan Recorded) Intravenous 1 day or 1 dose [name], MD   5,000 mg at 01/16/23 1305   heparin lock flush 100 unit/mL  500 Units Intravenous Once Nelva Nay  C, MD       heparin lock flush 100 unit/mL  500 Units Intracatheter Once PRN [name], MD        Performance status (ECOG): 1  Vitals Blood pressure 113/72, pulse 68, temperature (!) 97.4 F (36.3 C), resp. rate 12, weight 222 lb 12.8 oz (101.1 kg).   Physical Exam Constitutional:      General: He is not in acute distress.    Appearance: He is obese. He is not diaphoretic.  HENT:     Head: Normocephalic and atraumatic.  Eyes:     General: No scleral icterus.    Pupils: Pupils are equal, round, and reactive to light.  Cardiovascular:     Rate and Rhythm: Normal rate and regular rhythm.     Heart sounds: No murmur heard. Pulmonary:     Effort: Pulmonary effort is normal. No respiratory distress.     Breath sounds: No wheezing.     Comments: Decreased breath sound bilaterally.  Abdominal:     General: There is no distension.     Palpations: Abdomen is soft.     Tenderness: There is no abdominal tenderness.  Musculoskeletal:        General: Normal range of motion.     Cervical back: Normal range of motion and neck supple.     Comments: Trace edema bilaterally  Skin:    General: Skin is warm and dry.     Findings: No erythema.  Neurological:     Mental Status: He is alert and oriented to person, place, and time. Mental status is at baseline.     Cranial Nerves: No cranial nerve deficit.     Motor: No abnormal muscle tone.  Psychiatric:        Mood and Affect: Mood and affect normal.     Labs were reviewed by me.     Latest Ref Rng & Units 01/16/2023   10:27 AM 01/02/2023    8:06 AM 12/19/2022    9:11 AM  CBC  WBC 4.0 - 10.5 K/uL 5.2  3.8  4.8   Hemoglobin 13.0 - 17.0 g/dL 16.1  09.6  04.5   Hematocrit 39.0 -  52.0 % 34.8  34.1  34.6   Platelets 150 - 400 K/uL 189  177  164       Latest Ref Rng & Units 01/16/2023   10:27 AM 01/02/2023    8:06 AM 12/19/2022    9:11 AM  CMP  Glucose 70 - 99 mg/dL 98  97  409   BUN 8 - 23 mg/dL 9  11  8    Creatinine 0.61 - 1.24 mg/dL 8.11  9.14  7.82   Sodium 135 - 145 mmol/L 135  133  135   Potassium 3.5 - 5.1 mmol/L 3.6  3.7  3.4   Chloride 98 - 111 mmol/L 103  104  104   CO2 22 - 32 mmol/L 25  24  23    Calcium 8.9 - 10.3 mg/dL 9.3  9.2  8.9   Total Protein 6.5 - 8.1 g/dL 6.0  6.0  5.8   Total Bilirubin 0.3 - 1.2 mg/dL 1.1  0.7  1.3   Alkaline Phos 38 - 126 U/L 38  33  41   AST 15 - 41 U/L 24  24  31    ALT 0 - 44 U/L 13  12  14

## 2023-01-17 LAB — CEA: CEA: 20.9 ng/mL — ABNORMAL HIGH (ref 0.0–4.7)

## 2023-01-18 ENCOUNTER — Inpatient Hospital Stay: Payer: Medicare Other

## 2023-01-18 ENCOUNTER — Other Ambulatory Visit: Payer: Self-pay

## 2023-01-18 ENCOUNTER — Ambulatory Visit
Admission: RE | Admit: 2023-01-18 | Discharge: 2023-01-18 | Disposition: A | Discharge status: Home / Self Care | Payer: Medicare Other | Source: Ambulatory Visit | Attending: Radiation Oncology | Admitting: Radiation Oncology

## 2023-01-18 VITALS — BP 145/75 | HR 70 | Temp 97.4°F | Resp 18

## 2023-01-18 DIAGNOSIS — Z85038 Personal history of other malignant neoplasm of large intestine: Secondary | ICD-10-CM

## 2023-01-18 DIAGNOSIS — C786 Secondary malignant neoplasm of retroperitoneum and peritoneum: Secondary | ICD-10-CM

## 2023-01-18 DIAGNOSIS — Z510 Encounter for antineoplastic radiation therapy: Secondary | ICD-10-CM | POA: Diagnosis not present

## 2023-01-18 MED ORDER — SODIUM CHLORIDE 0.9% FLUSH
10.0000 mL | INTRAVENOUS | Status: DC | PRN
  Administered 2023-01-18: 10 mL
  Filled 2023-01-18: qty 10

## 2023-01-18 MED ORDER — HEPARIN SOD (PORK) LOCK FLUSH 100 UNIT/ML IV SOLN
500.0000 [IU] | Freq: Once | INTRAVENOUS | Status: AC | PRN
  Administered 2023-01-18: 500 [IU]
  Filled 2023-01-18: qty 5

## 2023-01-22 DIAGNOSIS — Z510 Encounter for antineoplastic radiation therapy: Secondary | ICD-10-CM | POA: Diagnosis not present

## 2023-01-26 ENCOUNTER — Other Ambulatory Visit: Payer: Self-pay | Admitting: *Deleted

## 2023-01-29 ENCOUNTER — Other Ambulatory Visit: Payer: Medicare Other

## 2023-01-29 ENCOUNTER — Ambulatory Visit: Admission: RE | Admit: 2023-01-29 | Payer: Medicare Other | Source: Ambulatory Visit

## 2023-01-29 DIAGNOSIS — R972 Elevated prostate specific antigen [PSA]: Secondary | ICD-10-CM

## 2023-01-29 DIAGNOSIS — N401 Enlarged prostate with lower urinary tract symptoms: Secondary | ICD-10-CM

## 2023-01-29 MED FILL — Dexamethasone Sodium Phosphate Inj 100 MG/10ML: INTRAMUSCULAR | Qty: 1 | Status: AC

## 2023-01-30 ENCOUNTER — Ambulatory Visit
Admission: RE | Admit: 2023-01-30 | Discharge: 2023-01-30 | Disposition: A | Discharge status: Home / Self Care | Payer: Medicare Other | Source: Ambulatory Visit | Attending: Radiation Oncology | Admitting: Radiation Oncology

## 2023-01-30 ENCOUNTER — Inpatient Hospital Stay (HOSPITAL_BASED_OUTPATIENT_CLINIC_OR_DEPARTMENT_OTHER): Payer: Medicare Other | Admitting: Oncology

## 2023-01-30 ENCOUNTER — Other Ambulatory Visit: Payer: Self-pay

## 2023-01-30 ENCOUNTER — Encounter: Payer: Self-pay | Admitting: Oncology

## 2023-01-30 ENCOUNTER — Inpatient Hospital Stay: Payer: Medicare Other

## 2023-01-30 VITALS — Temp 97.0°F | Resp 18 | Ht 68.0 in | Wt 219.8 lb

## 2023-01-30 VITALS — BP 141/72 | HR 54 | Temp 98.6°F | Resp 19

## 2023-01-30 DIAGNOSIS — R918 Other nonspecific abnormal finding of lung field: Secondary | ICD-10-CM

## 2023-01-30 DIAGNOSIS — Z85038 Personal history of other malignant neoplasm of large intestine: Secondary | ICD-10-CM | POA: Diagnosis not present

## 2023-01-30 DIAGNOSIS — M7989 Other specified soft tissue disorders: Secondary | ICD-10-CM

## 2023-01-30 DIAGNOSIS — T451X5A Adverse effect of antineoplastic and immunosuppressive drugs, initial encounter: Secondary | ICD-10-CM

## 2023-01-30 DIAGNOSIS — C786 Secondary malignant neoplasm of retroperitoneum and peritoneum: Secondary | ICD-10-CM | POA: Diagnosis not present

## 2023-01-30 DIAGNOSIS — C184 Malignant neoplasm of transverse colon: Secondary | ICD-10-CM | POA: Diagnosis not present

## 2023-01-30 DIAGNOSIS — R600 Localized edema: Secondary | ICD-10-CM | POA: Insufficient documentation

## 2023-01-30 DIAGNOSIS — Z5111 Encounter for antineoplastic chemotherapy: Secondary | ICD-10-CM

## 2023-01-30 DIAGNOSIS — R634 Abnormal weight loss: Secondary | ICD-10-CM

## 2023-01-30 DIAGNOSIS — D6481 Anemia due to antineoplastic chemotherapy: Secondary | ICD-10-CM

## 2023-01-30 DIAGNOSIS — Z510 Encounter for antineoplastic radiation therapy: Secondary | ICD-10-CM | POA: Diagnosis not present

## 2023-01-30 LAB — CBC WITH DIFFERENTIAL/PLATELET
Abs Immature Granulocytes: 0.01 10*3/uL (ref 0.00–0.07)
Basophils Absolute: 0.1 10*3/uL (ref 0.0–0.1)
Basophils Relative: 1 %
Eosinophils Absolute: 0.1 10*3/uL (ref 0.0–0.5)
Eosinophils Relative: 2 %
HCT: 34 % — ABNORMAL LOW (ref 39.0–52.0)
Hemoglobin: 10.8 g/dL — ABNORMAL LOW (ref 13.0–17.0)
Immature Granulocytes: 0 %
Lymphocytes Relative: 38 %
Lymphs Abs: 1.8 10*3/uL (ref 0.7–4.0)
MCH: 29.1 pg (ref 26.0–34.0)
MCHC: 31.8 g/dL (ref 30.0–36.0)
MCV: 91.6 fL (ref 80.0–100.0)
Monocytes Absolute: 0.4 10*3/uL (ref 0.1–1.0)
Monocytes Relative: 9 %
Neutro Abs: 2.3 10*3/uL (ref 1.7–7.7)
Neutrophils Relative %: 50 %
Platelets: 162 10*3/uL (ref 150–400)
RBC: 3.71 MIL/uL — ABNORMAL LOW (ref 4.22–5.81)
RDW: 16.1 % — ABNORMAL HIGH (ref 11.5–15.5)
WBC: 4.7 10*3/uL (ref 4.0–10.5)
nRBC: 0 % (ref 0.0–0.2)

## 2023-01-30 LAB — COMPREHENSIVE METABOLIC PANEL
ALT: 17 U/L (ref 0–44)
AST: 33 U/L (ref 15–41)
Albumin: 3.2 g/dL — ABNORMAL LOW (ref 3.5–5.0)
Alkaline Phosphatase: 37 U/L — ABNORMAL LOW (ref 38–126)
Anion gap: 5 (ref 5–15)
BUN: 8 mg/dL (ref 8–23)
CO2: 25 mmol/L (ref 22–32)
Calcium: 8.7 mg/dL — ABNORMAL LOW (ref 8.9–10.3)
Chloride: 106 mmol/L (ref 98–111)
Creatinine, Ser: 0.69 mg/dL (ref 0.61–1.24)
GFR, Estimated: >60 mL/min (ref >60)
Glucose, Bld: 94 mg/dL (ref 70–99)
Potassium: 3.4 mmol/L — ABNORMAL LOW (ref 3.5–5.1)
Sodium: 136 mmol/L (ref 135–145)
Total Bilirubin: 1.1 mg/dL (ref 0.3–1.2)
Total Protein: 5.7 g/dL — ABNORMAL LOW (ref 6.5–8.1)

## 2023-01-30 LAB — RAD ONC ARIA SESSION SUMMARY
Course Elapsed Days: 0
Plan Fractions Treated to Date: 1
Plan Prescribed Dose Per Fraction: 1.8 Gy
Plan Total Fractions Prescribed: 25
Plan Total Prescribed Dose: 45 Gy
Reference Point Dosage Given to Date: 1.8 Gy
Reference Point Session Dosage Given: 1.8 Gy
Session Number: 1

## 2023-01-30 LAB — PSA: Prostate Specific Ag, Serum: 12.3 ng/mL — ABNORMAL HIGH (ref 0.0–4.0)

## 2023-01-30 LAB — BRAIN NATRIURETIC PEPTIDE: B Natriuretic Peptide: 35.1 pg/mL (ref 0.0–100.0)

## 2023-01-30 LAB — PROTEIN, URINE, RANDOM: Total Protein, Urine: 93 mg/dL

## 2023-01-30 MED ORDER — POTASSIUM CHLORIDE CRYS ER 20 MEQ PO TBCR: 20.0000 meq | EXTENDED_RELEASE_TABLET | Freq: Every day | ORAL | 0 refills | Status: DC

## 2023-01-30 MED ORDER — SODIUM CHLORIDE 0.9 % IV SOLN
Freq: Once | INTRAVENOUS | Status: AC
  Filled 2023-01-30: qty 250

## 2023-01-30 MED ORDER — SODIUM CHLORIDE 0.9 % IV SOLN
2400.0000 mg/m2 | INTRAVENOUS | Status: DC
  Administered 2023-01-30: 5000 mg via INTRAVENOUS
  Filled 2023-01-30: qty 100

## 2023-01-30 MED ORDER — SODIUM CHLORIDE 0.9 % IV SOLN
400.0000 mg/m2 | Freq: Once | INTRAVENOUS | Status: AC
  Administered 2023-01-30: 884 mg via INTRAVENOUS
  Filled 2023-01-30: qty 44.2

## 2023-01-30 NOTE — Assessment & Plan Note (Signed)
Encourage protein rich diet. Follow up with nutritionist

## 2023-01-30 NOTE — Progress Notes (Signed)
Hematology/Oncology Progress note [phone] [phone]    Chief Complaint: Jared Gutierrez is a 82 y.o.  male presents for follow-up of metastatic colon cancer   ASSESSMENT & PLAN:   Cancer Staging  Cancer of transverse colon [facility]) Staging form: Colon and Rectum, [name], [name], [name], MD on 10/26/2021   Cancer of transverse colon (HCC) Overall he tolerates chemotherapy.   CEA is relatively stable with small fluctuations. CT images shows slight lung progression - Labs reviewed and discussed with patient Proceed with 5-FU today, he is on palliative radiation to masseteric soft tissue nodule.    Encounter for antineoplastic chemotherapy Chemotherapy plan as listed above.   Anemia due to chemotherapy decreased due to chemotherapy. continue to monitor.  Weight loss Encourage protein rich diet. Follow up with nutritionist   Lung nodules Slow growing small lung nodule, discussed with Dr. Rushie Chestnut.  Will hold off radiation and continue close monitor.  Bilateral leg edema Check BNP. Obtain Echo  Follow-up 2 weeks lab MD 5-FU  All questions were answered. The patient knows to call the clinic with any problems, questions or concerns.  [name], MD, PhD [facility] Hematology Oncology 01/30/2023     PERTINENT ONCOLOGY HISTORY Jared Gutierrez is a 82 y.o.[place] who has above oncology history reviewed by me today presented for follow up visit for management of Metastatic colon cancer. [name], patient switched care to me on 11/11/20 Extensive medical record review was performed by me  Oncology History  History of colon cancer, stage I  10/02/2014 Pathology Results   [id number] colonoscopy pathology A. CECAL POLYPS; HOT SNARE AND COLD BIOPSY:  - TUBULAR ADENOMA, MULTIPLE FRAGMENTS.  - NEGATIVE FOR HIGH-GRADE DYSPLASIA AND MALIGNANCY.   B. COLON POLYP,  TRANSVERSE; COLD BIOPSY:  - TUBULAR ADENOMA.  - NEGATIVE FOR HIGH-GRADE DYSPLASIA AND MALIGNANCY.     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  Tumor extended into the submucosa. Margins were negative. + lymphovascular invasion. 14 lymph nodes were negative. Pathologic stage was T1 N0.  TRANSVERSE COLON, RESECTION:  - INVASIVE ADENOCARCINOMA ARISING IN A 4.6 CM TUBULOVILLOUS  ADENOMA WITH HIGH GRADE DYSPLASIA (MALIGNANT POLYP).  - SEE SUMMARY BELOW.  [name]  ONCOLOGY SUMMARY: COLON AND RECTUM, RESECTION, AJCC 7TH EDITION  Specimen: transverse colon  Procedure: transverse colon resection  Tumor site: splenic flexure  Tumor size: 1.0 cm (invasive component)  Macroscopic tumor perforation: not specified  Histologic type: invasive adenocarcinoma  Histologic grade: moderately differentiated  Microscopic tumor extension: into submucosa  Margins:       Proximal margin: negative       Distal  margin: negative       Circumferential (radial) or mesenteric margin: negative       If all margins uninvolved by invasive carcinoma:       Distance of invasive carcinoma from closest margin: 7.5 cm  to distal margin  Treatment effect: not  applicable  Lymph-vascular invasion: present  Perineural invasion: not identified  Tumor deposits (discontinuous extramural extension): not  identified  Pathologic staging:       Primary tumor: pT1       Regional lymph nodes: pN0            Number of nodes examined: 14            Number of nodes involved: 0    04/02/2019 Progression   04/02/2019  PET scan  limited evealed a 2.4 x 2.3 cm (SUV 11) hypermetabolic soft tissue density caudal and anterior to the pancreatic neck favored to represent isolated peritoneal or nodal metastasis in the setting of prior transverse colonic resection (expected primary drainage). Although this was immediately adjacent to the pancreas, a fat plane was maintained, arguing strongly against a pancreatic primary. Otherwise, there was no evidence of hypermetabolic metastasis. 04/17/2019 EUS on  revealed a normal esophagus, stomach, duodenum, and pancreas.  There was a 2.4 x 2.4 cm irregular mass in the retroperitoneum adjacent to, but not involving the pancreatic neck.  FNA and core needle biopsy were performed.  Pathology revealed adenocarcinoma compatible with a metastatic lesion of colorectal origin.  Tumor cells were positive for CK20 and CDX2 and negative for CK7.  NGS: [employer] on 05/15/2019 revealed + [id number] and TP53.  Negative results included BRAF V600E, Her2, NRAS, NTRK, PD-L1 (<1%), and TMB 8.7/Mb (intermediate).  MMR testing from his colon resection on 10/16/2013 was intact with a low probability of MSI-H.   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  He received Neulasta after cycle #4 and #5 secondary to progressive leukopenia.  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  He received a truncated course of FOLFOX with cycle #11 secondary to oxaliplatin reaction.   01/14/2020 Imaging    PET revealed  an interval decrease in size and FDG uptake (2.4 x 2.3 cm with SUV 11 to 2.4 x 1.7 cm with SUV 4.09) associated with the previously referenced soft tissue density caudal and anterior to the pancreatic neck suggesting treatment response. There were no new sites of FDG avid tumor.   08/31/2020 Imaging   08/31/2020 Abdomen and pelvis CT revealed increased size of the index soft tissue lesion inferior and anterior to the pancreatic neck (2.9 x 1.8 cm to 3.5 x 2.9 cm). There were similar prominent retroperitoneal lymph nodes without adenopathy by size criteria. There was no new or enlarging abdominal or pelvic lymph nodes. There were no new interval findings. There was similar circumferential wall thickening of a nondistended urinary bladder, which likely accentuated wall thickening There was hepatic steatosis and aortic atherosclerosis.   11/03/2020, PET showed recurrent peritoneal metastasis in the upper abdomen adjacent to the pancreas.  The lesion is 3.8 x 2.9 cm with SUV of 11.3. No evidence of metastatic peritoneal disease elsewhere in the abdomen pelvis.  No evidence of distant metastasis.   11/03/2020 Imaging   PET scan showed recurrent peritoneal metastasis near pancreas. Given that he has no other distant metastasis. Discussed with radiation oncology.  Repeat SBRT may be considered if he does not respond well to systemic chemotherapy.     11/26/2020 -  Chemotherapy   5-FU and bevacizumab.   02/17/2021 Imaging   02/17/2021 CT abdomen pelvis showed partial response. Mild decrease of peritoneal lesion.  Proceed with 5-FU and bevacizumab today.  Given that he is tolerating current regimen with good life quality, partial response.  Shared decision was made to hold off adding additional chemotherapy agents.   06/08/2021, CT chest abdomen pelvis with contrast showed soft tissue mass at the base of mesentery inferior to the pancreatic neck is unchanged in size.  No progressive disease was identified.    09/07/2021, CT chest abdomen pelvis with contrast showed no substantial changes in size of the mesenteric mass, 2.6 cm.  No suspicious new lesions.  Chronic findings as detailed in the imaging report.   12/05/2021 Imaging   PET scan showed soft tissue mesenteric mass is unchanged in size.  Decreased FDG uptake compared to activity on November 03, 2020.  Fever treated disease.  No new metastatic disease.   03/21/2022 Imaging   CT chest abdomen pelvis  1. Similar size of the peripancreatic presumed peritoneal implant compared to 09/07/2021. 2. No new or progressive disease. 3. Incidental findings, including: Coronary artery atherosclerosis.Aortic Atherosclerosis (ICD10-I70.0). Prostatomegaly with chronic bladder wall thickening, suggesting outlet obstruction.     07/11/2022 Imaging   CT chest abdomen pelvis with contrast showed 1. New 6 mm nodule in the right upper lobe and 2 mm nodule in the left upper lobe. Although small these are concerning. Recommend short follow-up in 3 months 2. Stable low-attenuation lesion along the midbody of the pancreas. Continued attention on follow-up. No new peritoneal mass lesions or ascites. 3. Fatty liver infiltration 4. Colonic surgical changes   10/03/2022 Imaging   CT chest abdomen pelvis w contrast showed 1. Slight interval enlargement and cavitation of a nodule in the right upper lobe, measuring 0.6 cm, previously 0.5 cm. Slight interval enlargement of a left upper lobe nodule measuring 0.3 cm,previously 0.2 cm. Findings are concerning for slowly enlarging small pulmonary metastases. 2. No significant change in a lobulated soft tissue mass within the central small bowel mesentery abutting the inferior aspect of the pancreas, consistent with a stable, treated metastasis. 3. No other evidence of lymphadenopathy or metastatic disease in the chest, abdomen, or pelvis. 4. Prostatomegaly with diffuse wall thickening of the relatively decompressed urinary bladder,  likely secondary to chronic outlet obstruction. 5. Unchanged fibrotic scarring and bronchiectasis of the bilateral lung bases. This could be further assessed by pulmonary referral and dedicated interstitial lung disease protocol CT examination of the chest if and when clinically appropriate in the setting of known metastatic malignancy. Aortic Atherosclerosis    12/27/2022 Imaging   CT chest abdomen pelvis w contrast showed 1. Continued slight interval enlargement of a cavitary nodule in the right upper lobe measuring 0.7 x 0.6 cm, as well as a nodule in the peripheral left upper lobe measuring 0.5 cm. These are highly suspicious for slowly enlarging pulmonary metastases but remain below size threshold for reliable PET-CT characterization. 2. Unchanged, lobulated hypodense soft tissue mass in the central small bowel mesentery, consistent with treated metastatic disease. 3. Status post transverse colon resection and reanastomosis. 4. Thickening of the distal sigmoid colon and rectum, consistent with nonspecific infectious or inflammatory colitis. 5. Prostatomegaly with diffuse thickening of the urinary bladder wall, likely secondary to chronic outlet obstruction.   Aortic Atherosclerosis (ICD10-I70.0).   01/09/2023 Imaging  PET scan showed Status post transverse colectomy. 4.2 cm soft tissue mass in the central abdominal mesentery, suspicious for residual/recurrent nodal metastasis. Additional 8 mm short axis left para-aortic nodal metastasis. 7 mm cavitary right upper lobe nodule and 5 mm left upper lobe nodule, both beneath the size threshold for PET sensitivity, but suspicious for small pulmonary metastases.   01/30/2023 -  Radiation Therapy   Palliative radiation to mesenteric soft tissue mass.     Other medical problems Chronic lower extremity edema. 12/24/2018, right lower extremity duplex negative for DVT.  Small right Baker's cyst. 08/16/2019, bilateral lower extremity duplex showed  no DVT. 08/16/2019 - 08/17/2019 with right lower extremity cellulitis.  He was unable to bear weight.  He was treated with IV fluids, NSAIDs, colchicine, and broad antibiotics (vancomycin and Cefepime).  He was discharged on indomethacin x 5 days and Keflex 500 mg TID x 5 days.  10/05/2020, colonoscopy showed internal hemorrhoids.  Otherwise normal examination.  INTERVAL HISTORY Jared Gutierrez is a 82 y.o. male who has above history reviewed by me today presents for follow up visit for management of recurrent metastatic colon cancer Patient was accompanied by wife Denies fever, chills, nausea, vomiting, diarrhea, chest pain, shortness of breath, abdominal pain,  He has bilateral lower extremities edema, worse at the end of the day. No calf tenderness   Review of Systems  Constitutional:  Negative for appetite change, chills, diaphoresis, fever and unexpected weight change.  HENT:   Negative for hearing loss, nosebleeds, sore throat and tinnitus.   Respiratory:  Negative for cough, hemoptysis and shortness of breath.   Cardiovascular:  Negative for chest pain and palpitations.  Gastrointestinal:  Negative for abdominal pain, blood in stool, constipation, diarrhea, nausea and vomiting.  Genitourinary:  Negative for dysuria, frequency and hematuria.   Musculoskeletal:  Positive for arthralgias. Negative for back pain, myalgias and neck pain.  Skin:  Negative for itching and rash.  Neurological:  Positive for numbness. Negative for dizziness and headaches.  Hematological:  Does not bruise/bleed easily.  Psychiatric/Behavioral:  Negative for depression. The patient is not nervous/anxious.       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   Chicken pox    Colon cancer (HCC)    History of kidney stones    Hypertension    Lipoma of colon    Nephrolithiasis    Nephrolithiasis    Obesity    Shingles     Tubular adenoma of colon    multiple fragments    Past Surgical History:  Procedure Laterality Date   COLON SURGERY     COLONOSCOPY N/A 10/02/2014   Procedure: COLONOSCOPY;  Surgeon: Elnita Maxwell, MD;  Location: [facility] ENDOSCOPY;  Service: Endoscopy;  Laterality: N/A;   COLONOSCOPY WITH PROPOFOL N/A 02/16/2017   Procedure: COLONOSCOPY WITH PROPOFOL;  Surgeon: Scot Jun, MD;  Location: [facility] ENDOSCOPY;  Service: Endoscopy;  Laterality: N/A;   COLONOSCOPY WITH PROPOFOL N/A 10/05/2020   Procedure: COLONOSCOPY WITH PROPOFOL;  Surgeon: Regis Bill, MD;  Location: [facility] ENDOSCOPY;  Service: Endoscopy;  Laterality: N/A;   EUS N/A 04/17/2019   Procedure: FULL UPPER ENDOSCOPIC ULTRASOUND (EUS) RADIAL;  Surgeon: Bearl Mulberry, MD;  Location: [facility] ENDOSCOPY;  Service: Gastroenterology;  Laterality: N/A;   KIDNEY STONE SURGERY     PARTIAL COLECTOMY  10/17/2013   PORTACATH PLACEMENT Right  06/13/2019   Procedure: INSERTION PORT-A-CATH;  Surgeon: Sung Amabile, DO;  Location: [facility] ORS;  Service: General;  Laterality: Right;    Family History  Problem Relation Age of Onset   Cancer Mother    Breast cancer Mother    COPD Father     Social History:  reports that he has never smoked. He has never used smokeless tobacco. He reports current alcohol use. He reports that he does not use drugs.    Allergies: No Known Allergies  Current Medications: Current Outpatient Medications  Medication Sig Dispense Refill   acetaminophen (TYLENOL) 500 MG tablet Take 500 mg by mouth every 6 (six) hours as needed.     amLODipine (NORVASC) 2.5 MG tablet Take 2.5 mg by mouth daily.     aspirin EC 81 MG tablet Take 81 mg by mouth daily.     Calcium Carbonate (CALCIUM 600 PO) Take 1 tablet by mouth 2 (two) times daily.     Cholecalciferol (VITAMIN D) 125 MCG (5000 UT) CAPS Take 5,000 mg by mouth.     docusate sodium (COLACE) 100 MG capsule Take 1 capsule (100 mg total) by mouth 2 (two) times  daily as needed for mild constipation or moderate constipation. 60 capsule 3   lidocaine-prilocaine (EMLA) cream APPLY TO AFFECTED AREA ONCE AS DIRECTED 30 g 3   loperamide (IMODIUM) 2 MG capsule Take 1 capsule (2 mg total) by mouth See admin instructions. Take 2 tablets after first loose stool,  then 1 tablet  after each loose stool; maximum: 8 tablets /day 60 capsule 0   loratadine (CLARITIN) 10 MG tablet Take 10 mg by mouth daily.     olmesartan (BENICAR) 20 MG tablet Take 1 tablet by mouth daily.     ondansetron (ZOFRAN) 8 MG tablet Take 1 tablet (8 mg total) by mouth 2 (two) times daily as needed for refractory nausea / vomiting. Start on day 3 after chemotherapy. 60 tablet 1   Probiotic Product (PROBIOTIC DAILY PO) Take 1 tablet by mouth daily.     prochlorperazine (COMPAZINE) 10 MG tablet Take 1 tablet (10 mg total) by mouth every 6 (six) hours as needed (NAUSEA). 30 tablet 1   senna-docusate (SENNA S) 8.6-50 MG tablet Take 2 tablets by mouth daily. 60 tablet 3   HYDROcodone-acetaminophen (NORCO/VICODIN) 5-325 MG tablet Take 1 tablet by mouth every 6 (six) hours as needed for moderate pain (up to 3 doses for moderate pain.). (Patient not taking: Reported on 01/16/2023)     potassium chloride SA (KLOR-CON M20) 20 MEQ tablet Take 1 tablet (20 mEq total) by mouth daily. 180 tablet 0   No current facility-administered medications for this visit.   Facility-Administered Medications Ordered in Other Visits  Medication Dose Route Frequency Provider Last Rate Last Admin   0.9 %  sodium chloride infusion   Intravenous Once Corcoran, Melissa C, MD       0.9 %  sodium chloride infusion   Intravenous Continuous Nelva Nay C, MD 10 mL/hr at 12/31/19 1000 New Bag at 10/05/20 1045   heparin lock flush 100 unit/mL  500 Units Intravenous Once Rosey Bath, MD        Performance status (ECOG): 1  Vitals Temperature (!) 97 F (36.1 C), resp. rate 18, height 5\' 8"  (1.727 m), weight 219 lb  12.8 oz (99.7 kg).   Physical Exam Constitutional:      General: He is not in acute distress.    Appearance: He is obese. He is  not diaphoretic.  HENT:     Head: Normocephalic and atraumatic.  Eyes:     General: No scleral icterus. Cardiovascular:     Rate and Rhythm: Normal rate and regular rhythm.     Heart sounds: No murmur heard. Pulmonary:     Effort: Pulmonary effort is normal. No respiratory distress.     Breath sounds: No wheezing.     Comments: Decreased breath sound bilaterally.  Abdominal:     General: Bowel sounds are normal. There is no distension.     Palpations: Abdomen is soft.  Musculoskeletal:        General: Normal range of motion.     Cervical back: Normal range of motion and neck supple.     Comments: Trace edema bilaterally  Skin:    General: Skin is warm and dry.     Findings: No erythema.  Neurological:     Mental Status: He is alert and oriented to person, place, and time. Mental status is at baseline.     Motor: No abnormal muscle tone.  Psychiatric:        Mood and Affect: Mood and affect normal.     Labs were reviewed by me.     Latest Ref Rng & Units 01/30/2023   10:10 AM 01/16/2023   10:27 AM 01/02/2023    8:06 AM  CBC  WBC 4.0 - 10.5 K/uL 4.7  5.2  3.8   Hemoglobin 13.0 - 17.0 g/dL 56.2  13.0  86.5   Hematocrit 39.0 - 52.0 % 34.0  34.8  34.1   Platelets 150 - 400 K/uL 162  189  177       Latest Ref Rng & Units 01/30/2023   10:10 AM 01/16/2023   10:27 AM 01/02/2023    8:06 AM  CMP  Glucose 70 - 99 mg/dL 94  98  97   BUN 8 - 23 mg/dL 8  9  11    Creatinine 0.61 - 1.24 mg/dL 7.84  6.96  2.95   Sodium 135 - 145 mmol/L 136  135  133   Potassium 3.5 - 5.1 mmol/L 3.4  3.6  3.7   Chloride 98 - 111 mmol/L 106  103  104   CO2 22 - 32 mmol/L 25  25  24    Calcium 8.9 - 10.3 mg/dL 8.7  9.3  9.2   Total Protein 6.5 - 8.1 g/dL 5.7  6.0  6.0   Total Bilirubin 0.3 - 1.2 mg/dL 1.1  1.1  0.7   Alkaline Phos 38 - 126 U/L 37  38  33   AST 15 - 41 U/L 33   24  24   ALT 0 - 44 U/L 17  13  12

## 2023-01-30 NOTE — Assessment & Plan Note (Signed)
decreased due to chemotherapy. continue to monitor.

## 2023-01-30 NOTE — Patient Instructions (Signed)
[place] [facility]  Discharge Instructions: Thank you for choosing [facility] to provide your oncology and hematology care.  If you have a lab appointment with the Cancer Center, please go directly to the Cancer Center and check in at the registration area.  Wear comfortable clothing and clothing appropriate for easy access to any Portacath or PICC line.   We strive to give you quality time with your provider. You may need to reschedule your appointment if you arrive late (15 or more minutes).  Arriving late affects you and other patients whose appointments are after yours.  Also, if you miss three or more appointments without notifying the office, you may be dismissed from the clinic at the provider's discretion.      For prescription refill requests, have your pharmacy contact our office and allow 72 hours for refills to be completed.    Today you received the following chemotherapy and/or immunotherapy agents leucovorin and adrucil      To help prevent nausea and vomiting after your treatment, we encourage you to take your nausea medication as directed.  BELOW ARE SYMPTOMS THAT SHOULD BE REPORTED IMMEDIATELY: *FEVER GREATER THAN 100.4 F (38 C) OR HIGHER *CHILLS OR SWEATING *NAUSEA AND VOMITING THAT IS NOT CONTROLLED WITH YOUR NAUSEA MEDICATION *UNUSUAL SHORTNESS OF BREATH *UNUSUAL BRUISING OR BLEEDING *URINARY PROBLEMS (pain or burning when urinating, or frequent urination) *BOWEL PROBLEMS (unusual diarrhea, constipation, pain near the anus) TENDERNESS IN MOUTH AND THROAT WITH OR WITHOUT PRESENCE OF ULCERS (sore throat, sores in mouth, or a toothache) UNUSUAL RASH, SWELLING OR PAIN  UNUSUAL VAGINAL DISCHARGE OR ITCHING   Items with * indicate a potential emergency and should be followed up as soon as possible or go to the Emergency Department if any problems should occur.  Please show the CHEMOTHERAPY ALERT CARD or IMMUNOTHERAPY ALERT CARD at  check-in to the Emergency Department and triage nurse.  Should you have questions after your visit or need to cancel or reschedule your appointment, please contact [place] [facility]  [phone] and follow the prompts.  Office hours are 8:00 a.m. to 4:30 p.m. Monday - Friday. Please note that voicemails left after 4:00 p.m. may not be returned until the following business day.  We are closed weekends and major holidays. You have access to a nurse at all times for urgent questions. Please call the main number to the clinic [phone] and follow the prompts.  For any non-urgent questions, you may also contact your provider using MyChart. We now offer e-Visits for anyone 4 and older to request care online for non-urgent symptoms. For details visit [email or web address].   Also download the MyChart app! Go to the app store, search "MyChart", open the app, select [place], and log in with your MyChart username and password.

## 2023-01-30 NOTE — Assessment & Plan Note (Signed)
Slow growing small lung nodule, discussed with Dr. Rushie Chestnut.  Will hold off radiation and continue close monitor.

## 2023-01-30 NOTE — Assessment & Plan Note (Signed)
Check BNP. Obtain Echo

## 2023-01-30 NOTE — Assessment & Plan Note (Signed)
Chemotherapy plan as listed above 

## 2023-01-30 NOTE — Assessment & Plan Note (Addendum)
Overall he tolerates chemotherapy.   CEA is relatively stable with small fluctuations. CT images shows slight lung progression - Labs reviewed and discussed with patient Proceed with 5-FU today, he is on palliative radiation to masseteric soft tissue nodule.

## 2023-01-31 ENCOUNTER — Ambulatory Visit
Admission: RE | Admit: 2023-01-31 | Discharge: 2023-01-31 | Disposition: A | Discharge status: Home / Self Care | Payer: Medicare Other | Source: Ambulatory Visit | Attending: Radiation Oncology | Admitting: Radiation Oncology

## 2023-01-31 ENCOUNTER — Other Ambulatory Visit: Payer: Self-pay

## 2023-01-31 DIAGNOSIS — Z510 Encounter for antineoplastic radiation therapy: Secondary | ICD-10-CM | POA: Diagnosis not present

## 2023-01-31 LAB — RAD ONC ARIA SESSION SUMMARY
Course Elapsed Days: 1
Plan Fractions Treated to Date: 2
Plan Prescribed Dose Per Fraction: 1.8 Gy
Plan Total Fractions Prescribed: 25
Plan Total Prescribed Dose: 45 Gy
Reference Point Dosage Given to Date: 3.6 Gy
Reference Point Session Dosage Given: 1.8 Gy
Session Number: 2

## 2023-02-01 ENCOUNTER — Other Ambulatory Visit: Payer: Self-pay

## 2023-02-01 ENCOUNTER — Ambulatory Visit
Admission: RE | Admit: 2023-02-01 | Discharge: 2023-02-01 | Disposition: A | Discharge status: Home / Self Care | Payer: Medicare Other | Source: Ambulatory Visit | Attending: Radiation Oncology | Admitting: Radiation Oncology

## 2023-02-01 ENCOUNTER — Inpatient Hospital Stay: Payer: Medicare Other

## 2023-02-01 VITALS — BP 128/74 | HR 64 | Resp 18

## 2023-02-01 DIAGNOSIS — C786 Secondary malignant neoplasm of retroperitoneum and peritoneum: Secondary | ICD-10-CM

## 2023-02-01 DIAGNOSIS — Z85038 Personal history of other malignant neoplasm of large intestine: Secondary | ICD-10-CM

## 2023-02-01 DIAGNOSIS — Z510 Encounter for antineoplastic radiation therapy: Secondary | ICD-10-CM | POA: Diagnosis not present

## 2023-02-01 LAB — RAD ONC ARIA SESSION SUMMARY
Course Elapsed Days: 2
Plan Fractions Treated to Date: 3
Plan Prescribed Dose Per Fraction: 1.8 Gy
Plan Total Fractions Prescribed: 25
Plan Total Prescribed Dose: 45 Gy
Reference Point Dosage Given to Date: 5.4 Gy
Reference Point Session Dosage Given: 1.8 Gy
Session Number: 3

## 2023-02-01 MED ORDER — SODIUM CHLORIDE 0.9% FLUSH
10.0000 mL | INTRAVENOUS | Status: AC | PRN
  Administered 2023-02-01: 10 mL
  Filled 2023-02-01: qty 10

## 2023-02-01 MED ORDER — HEPARIN SOD (PORK) LOCK FLUSH 100 UNIT/ML IV SOLN
500.0000 [IU] | Freq: Once | INTRAVENOUS | Status: AC | PRN
  Administered 2023-02-01: 500 [IU]
  Filled 2023-02-01: qty 5

## 2023-02-02 ENCOUNTER — Ambulatory Visit
Admission: RE | Admit: 2023-02-02 | Discharge: 2023-02-02 | Disposition: A | Discharge status: Home / Self Care | Payer: Medicare Other | Source: Ambulatory Visit | Attending: Radiation Oncology | Admitting: Radiation Oncology

## 2023-02-02 ENCOUNTER — Other Ambulatory Visit: Payer: Self-pay

## 2023-02-02 DIAGNOSIS — Z510 Encounter for antineoplastic radiation therapy: Secondary | ICD-10-CM | POA: Diagnosis not present

## 2023-02-02 LAB — RAD ONC ARIA SESSION SUMMARY
Course Elapsed Days: 3
Plan Fractions Treated to Date: 4
Plan Prescribed Dose Per Fraction: 1.8 Gy
Plan Total Fractions Prescribed: 25
Plan Total Prescribed Dose: 45 Gy
Reference Point Dosage Given to Date: 7.2 Gy
Reference Point Session Dosage Given: 1.8 Gy
Session Number: 4

## 2023-02-05 ENCOUNTER — Other Ambulatory Visit: Payer: Self-pay

## 2023-02-05 ENCOUNTER — Ambulatory Visit
Admission: RE | Admit: 2023-02-05 | Discharge: 2023-02-05 | Disposition: A | Discharge status: Home / Self Care | Payer: Medicare Other | Source: Ambulatory Visit | Attending: Radiation Oncology | Admitting: Radiation Oncology

## 2023-02-05 DIAGNOSIS — Z510 Encounter for antineoplastic radiation therapy: Secondary | ICD-10-CM | POA: Diagnosis not present

## 2023-02-05 LAB — RAD ONC ARIA SESSION SUMMARY
Course Elapsed Days: 6
Plan Fractions Treated to Date: 5
Plan Prescribed Dose Per Fraction: 1.8 Gy
Plan Total Fractions Prescribed: 25
Plan Total Prescribed Dose: 45 Gy
Reference Point Dosage Given to Date: 9 Gy
Reference Point Session Dosage Given: 1.8 Gy
Session Number: 5

## 2023-02-06 ENCOUNTER — Other Ambulatory Visit: Payer: Self-pay

## 2023-02-06 ENCOUNTER — Ambulatory Visit
Admission: RE | Admit: 2023-02-06 | Discharge: 2023-02-06 | Disposition: A | Discharge status: Home / Self Care | Payer: Medicare Other | Source: Ambulatory Visit | Attending: Radiation Oncology | Admitting: Radiation Oncology

## 2023-02-06 DIAGNOSIS — Z510 Encounter for antineoplastic radiation therapy: Secondary | ICD-10-CM | POA: Diagnosis not present

## 2023-02-06 LAB — RAD ONC ARIA SESSION SUMMARY
Course Elapsed Days: 7
Plan Fractions Treated to Date: 6
Plan Prescribed Dose Per Fraction: 1.8 Gy
Plan Total Fractions Prescribed: 25
Plan Total Prescribed Dose: 45 Gy
Reference Point Dosage Given to Date: 10.8 Gy
Reference Point Session Dosage Given: 1.8 Gy
Session Number: 6

## 2023-02-07 ENCOUNTER — Ambulatory Visit
Admission: RE | Admit: 2023-02-07 | Discharge: 2023-02-07 | Disposition: A | Discharge status: Home / Self Care | Payer: Medicare Other | Source: Ambulatory Visit | Attending: Radiation Oncology | Admitting: Radiation Oncology

## 2023-02-07 ENCOUNTER — Other Ambulatory Visit: Payer: Self-pay

## 2023-02-07 DIAGNOSIS — Z510 Encounter for antineoplastic radiation therapy: Secondary | ICD-10-CM | POA: Diagnosis not present

## 2023-02-07 LAB — RAD ONC ARIA SESSION SUMMARY
Course Elapsed Days: 8
Plan Fractions Treated to Date: 7
Plan Prescribed Dose Per Fraction: 1.8 Gy
Plan Total Fractions Prescribed: 25
Plan Total Prescribed Dose: 45 Gy
Reference Point Dosage Given to Date: 12.6 Gy
Reference Point Session Dosage Given: 1.8 Gy
Session Number: 7

## 2023-02-08 ENCOUNTER — Other Ambulatory Visit: Payer: Self-pay

## 2023-02-08 ENCOUNTER — Ambulatory Visit
Admission: RE | Admit: 2023-02-08 | Discharge: 2023-02-08 | Disposition: A | Discharge status: Home / Self Care | Payer: Medicare Other | Source: Ambulatory Visit | Attending: Radiation Oncology | Admitting: Radiation Oncology

## 2023-02-08 DIAGNOSIS — N401 Enlarged prostate with lower urinary tract symptoms: Secondary | ICD-10-CM

## 2023-02-08 DIAGNOSIS — Z510 Encounter for antineoplastic radiation therapy: Secondary | ICD-10-CM | POA: Diagnosis not present

## 2023-02-08 DIAGNOSIS — R972 Elevated prostate specific antigen [PSA]: Secondary | ICD-10-CM

## 2023-02-08 LAB — RAD ONC ARIA SESSION SUMMARY
Course Elapsed Days: 9
Plan Fractions Treated to Date: 8
Plan Prescribed Dose Per Fraction: 1.8 Gy
Plan Total Fractions Prescribed: 25
Plan Total Prescribed Dose: 45 Gy
Reference Point Dosage Given to Date: 14.4 Gy
Reference Point Session Dosage Given: 1.8 Gy
Session Number: 8

## 2023-02-09 ENCOUNTER — Other Ambulatory Visit: Payer: Self-pay

## 2023-02-09 ENCOUNTER — Ambulatory Visit
Admission: RE | Admit: 2023-02-09 | Discharge: 2023-02-09 | Disposition: A | Discharge status: Home / Self Care | Payer: Medicare Other | Source: Ambulatory Visit | Attending: Radiation Oncology | Admitting: Radiation Oncology

## 2023-02-09 DIAGNOSIS — Z510 Encounter for antineoplastic radiation therapy: Secondary | ICD-10-CM | POA: Diagnosis not present

## 2023-02-09 LAB — RAD ONC ARIA SESSION SUMMARY
Course Elapsed Days: 10
Plan Fractions Treated to Date: 9
Plan Prescribed Dose Per Fraction: 1.8 Gy
Plan Total Fractions Prescribed: 25
Plan Total Prescribed Dose: 45 Gy
Reference Point Dosage Given to Date: 16.2 Gy
Reference Point Session Dosage Given: 1.8 Gy
Session Number: 9

## 2023-02-12 ENCOUNTER — Ambulatory Visit
Admission: RE | Admit: 2023-02-12 | Discharge: 2023-02-12 | Disposition: A | Discharge status: Home / Self Care | Payer: Medicare Other | Source: Ambulatory Visit | Attending: Radiation Oncology | Admitting: Radiation Oncology

## 2023-02-12 ENCOUNTER — Other Ambulatory Visit: Payer: Self-pay

## 2023-02-12 DIAGNOSIS — Z510 Encounter for antineoplastic radiation therapy: Secondary | ICD-10-CM | POA: Diagnosis not present

## 2023-02-12 LAB — RAD ONC ARIA SESSION SUMMARY
Course Elapsed Days: 13
Plan Fractions Treated to Date: 10
Plan Prescribed Dose Per Fraction: 1.8 Gy
Plan Total Fractions Prescribed: 25
Plan Total Prescribed Dose: 45 Gy
Reference Point Dosage Given to Date: 18 Gy
Reference Point Session Dosage Given: 1.8 Gy
Session Number: 10

## 2023-02-13 ENCOUNTER — Inpatient Hospital Stay (HOSPITAL_BASED_OUTPATIENT_CLINIC_OR_DEPARTMENT_OTHER): Payer: Medicare Other | Admitting: Oncology

## 2023-02-13 ENCOUNTER — Ambulatory Visit
Admission: RE | Admit: 2023-02-13 | Discharge: 2023-02-13 | Disposition: A | Discharge status: Home / Self Care | Payer: Medicare Other | Source: Ambulatory Visit | Attending: Radiation Oncology | Admitting: Radiation Oncology

## 2023-02-13 ENCOUNTER — Inpatient Hospital Stay: Payer: Medicare Other

## 2023-02-13 ENCOUNTER — Encounter: Payer: Self-pay | Admitting: Oncology

## 2023-02-13 ENCOUNTER — Other Ambulatory Visit: Payer: Self-pay

## 2023-02-13 VITALS — BP 107/77 | HR 61 | Temp 97.7°F | Resp 18 | Wt 215.4 lb

## 2023-02-13 DIAGNOSIS — Z5111 Encounter for antineoplastic chemotherapy: Secondary | ICD-10-CM | POA: Insufficient documentation

## 2023-02-13 DIAGNOSIS — Z803 Family history of malignant neoplasm of breast: Secondary | ICD-10-CM | POA: Insufficient documentation

## 2023-02-13 DIAGNOSIS — C786 Secondary malignant neoplasm of retroperitoneum and peritoneum: Secondary | ICD-10-CM | POA: Insufficient documentation

## 2023-02-13 DIAGNOSIS — T451X5A Adverse effect of antineoplastic and immunosuppressive drugs, initial encounter: Secondary | ICD-10-CM | POA: Insufficient documentation

## 2023-02-13 DIAGNOSIS — R600 Localized edema: Secondary | ICD-10-CM | POA: Diagnosis not present

## 2023-02-13 DIAGNOSIS — Z836 Family history of other diseases of the respiratory system: Secondary | ICD-10-CM | POA: Insufficient documentation

## 2023-02-13 DIAGNOSIS — Z452 Encounter for adjustment and management of vascular access device: Secondary | ICD-10-CM | POA: Insufficient documentation

## 2023-02-13 DIAGNOSIS — Z510 Encounter for antineoplastic radiation therapy: Secondary | ICD-10-CM | POA: Diagnosis present

## 2023-02-13 DIAGNOSIS — D6481 Anemia due to antineoplastic chemotherapy: Secondary | ICD-10-CM | POA: Insufficient documentation

## 2023-02-13 DIAGNOSIS — R918 Other nonspecific abnormal finding of lung field: Secondary | ICD-10-CM | POA: Insufficient documentation

## 2023-02-13 DIAGNOSIS — C184 Malignant neoplasm of transverse colon: Secondary | ICD-10-CM | POA: Insufficient documentation

## 2023-02-13 DIAGNOSIS — Z7982 Long term (current) use of aspirin: Secondary | ICD-10-CM | POA: Insufficient documentation

## 2023-02-13 DIAGNOSIS — Z85038 Personal history of other malignant neoplasm of large intestine: Secondary | ICD-10-CM | POA: Insufficient documentation

## 2023-02-13 DIAGNOSIS — K521 Toxic gastroenteritis and colitis: Secondary | ICD-10-CM | POA: Insufficient documentation

## 2023-02-13 DIAGNOSIS — R634 Abnormal weight loss: Secondary | ICD-10-CM | POA: Insufficient documentation

## 2023-02-13 DIAGNOSIS — Z79899 Other long term (current) drug therapy: Secondary | ICD-10-CM | POA: Insufficient documentation

## 2023-02-13 DIAGNOSIS — Z809 Family history of malignant neoplasm, unspecified: Secondary | ICD-10-CM | POA: Insufficient documentation

## 2023-02-13 DIAGNOSIS — Z860100 Personal history of colon polyps, unspecified: Secondary | ICD-10-CM | POA: Insufficient documentation

## 2023-02-13 LAB — RAD ONC ARIA SESSION SUMMARY
Course Elapsed Days: 14
Plan Fractions Treated to Date: 11
Plan Prescribed Dose Per Fraction: 1.8 Gy
Plan Total Fractions Prescribed: 25
Plan Total Prescribed Dose: 45 Gy
Reference Point Dosage Given to Date: 19.8 Gy
Reference Point Session Dosage Given: 1.8 Gy
Session Number: 11

## 2023-02-13 LAB — COMPREHENSIVE METABOLIC PANEL
ALT: 15 U/L (ref 0–44)
AST: 28 U/L (ref 15–41)
Albumin: 3.3 g/dL — ABNORMAL LOW (ref 3.5–5.0)
Alkaline Phosphatase: 39 U/L (ref 38–126)
Anion gap: 9 (ref 5–15)
BUN: 12 mg/dL (ref 8–23)
CO2: 24 mmol/L (ref 22–32)
Calcium: 9 mg/dL (ref 8.9–10.3)
Chloride: 100 mmol/L (ref 98–111)
Creatinine, Ser: 0.75 mg/dL (ref 0.61–1.24)
GFR, Estimated: >60 mL/min (ref >60)
Glucose, Bld: 108 mg/dL — ABNORMAL HIGH (ref 70–99)
Potassium: 3.9 mmol/L (ref 3.5–5.1)
Sodium: 133 mmol/L — ABNORMAL LOW (ref 135–145)
Total Bilirubin: 0.8 mg/dL (ref 0.3–1.2)
Total Protein: 6.3 g/dL — ABNORMAL LOW (ref 6.5–8.1)

## 2023-02-13 LAB — CBC WITH DIFFERENTIAL/PLATELET
Abs Immature Granulocytes: 0.01 10*3/uL (ref 0.00–0.07)
Basophils Absolute: 0 10*3/uL (ref 0.0–0.1)
Basophils Relative: 1 %
Eosinophils Absolute: 0.3 10*3/uL (ref 0.0–0.5)
Eosinophils Relative: 5 %
HCT: 34.8 % — ABNORMAL LOW (ref 39.0–52.0)
Hemoglobin: 11.3 g/dL — ABNORMAL LOW (ref 13.0–17.0)
Immature Granulocytes: 0 %
Lymphocytes Relative: 17 %
Lymphs Abs: 0.9 10*3/uL (ref 0.7–4.0)
MCH: 30 pg (ref 26.0–34.0)
MCHC: 32.5 g/dL (ref 30.0–36.0)
MCV: 92.3 fL (ref 80.0–100.0)
Monocytes Absolute: 0.7 10*3/uL (ref 0.1–1.0)
Monocytes Relative: 15 %
Neutro Abs: 3 10*3/uL (ref 1.7–7.7)
Neutrophils Relative %: 62 %
Platelets: 184 10*3/uL (ref 150–400)
RBC: 3.77 MIL/uL — ABNORMAL LOW (ref 4.22–5.81)
RDW: 16.2 % — ABNORMAL HIGH (ref 11.5–15.5)
WBC: 4.9 10*3/uL (ref 4.0–10.5)
nRBC: 0 % (ref 0.0–0.2)

## 2023-02-13 MED ORDER — SODIUM CHLORIDE 0.9 % IV SOLN
Freq: Once | INTRAVENOUS | Status: AC
  Filled 2023-02-13: qty 250

## 2023-02-13 MED ORDER — SODIUM CHLORIDE 0.9 % IV SOLN
400.0000 mg/m2 | Freq: Once | INTRAVENOUS | Status: AC
  Administered 2023-02-13: 884 mg via INTRAVENOUS
  Filled 2023-02-13: qty 44.2

## 2023-02-13 MED ORDER — SODIUM CHLORIDE 0.9 % IV SOLN
2400.0000 mg/m2 | INTRAVENOUS | Status: DC
  Administered 2023-02-13: 5000 mg via INTRAVENOUS
  Filled 2023-02-13: qty 100

## 2023-02-13 MED ORDER — LOPERAMIDE HCL 2 MG PO CAPS: 2.0000 mg | ORAL_CAPSULE | ORAL | 0 refills | Status: DC

## 2023-02-13 NOTE — Assessment & Plan Note (Signed)
Recommend patient to stop laxatives and stool softener.  Recommend imodium PRN

## 2023-02-13 NOTE — Assessment & Plan Note (Signed)
Normal BNP. Obtain Echo

## 2023-02-13 NOTE — Assessment & Plan Note (Signed)
Chemotherapy plan as listed above 

## 2023-02-13 NOTE — Patient Instructions (Signed)
[facility]  Discharge Instructions: Thank you for choosing [facility] to provide your oncology and hematology care.  If you have a lab appointment with the Cancer Center, please go directly to the Cancer Center and check in at the registration area.  Wear comfortable clothing and clothing appropriate for easy access to any Portacath or PICC line.   We strive to give you quality time with your provider. You may need to reschedule your appointment if you arrive late (15 or more minutes).  Arriving late affects you and other patients whose appointments are after yours.  Also, if you miss three or more appointments without notifying the office, you may be dismissed from the clinic at the provider's discretion.      For prescription refill requests, have your pharmacy contact our office and allow 72 hours for refills to be completed.    Today you received the following chemotherapy and/or immunotherapy agents LEUCOVORIN and 5 FU      To help prevent nausea and vomiting after your treatment, we encourage you to take your nausea medication as directed.  BELOW ARE SYMPTOMS THAT SHOULD BE REPORTED IMMEDIATELY: *FEVER GREATER THAN 100.4 F (38 C) OR HIGHER *CHILLS OR SWEATING *NAUSEA AND VOMITING THAT IS NOT CONTROLLED WITH YOUR NAUSEA MEDICATION *UNUSUAL SHORTNESS OF BREATH *UNUSUAL BRUISING OR BLEEDING *URINARY PROBLEMS (pain or burning when urinating, or frequent urination) *BOWEL PROBLEMS (unusual diarrhea, constipation, pain near the anus) TENDERNESS IN MOUTH AND THROAT WITH OR WITHOUT PRESENCE OF ULCERS (sore throat, sores in mouth, or a toothache) UNUSUAL RASH, SWELLING OR PAIN  UNUSUAL VAGINAL DISCHARGE OR ITCHING   Items with * indicate a potential emergency and should be followed up as soon as possible or go to the Emergency Department if any problems should occur.  Please show the CHEMOTHERAPY ALERT CARD or IMMUNOTHERAPY ALERT CARD at  check-in to the Emergency Department and triage nurse.  Should you have questions after your visit or need to cancel or reschedule your appointment, please contact [facility]  [phone] and follow the prompts.  Office hours are 8:00 a.m. to 4:30 p.m. Monday - Friday. Please note that voicemails left after 4:00 p.m. may not be returned until the following business day.  We are closed weekends and major holidays. You have access to a nurse at all times for urgent questions. Please call the main number to the clinic [phone] and follow the prompts.  For any non-urgent questions, you may also contact your provider using MyChart. We now offer e-Visits for anyone 18 and older to request care online for non-urgent symptoms. For details visit [email or web address].   Also download the MyChart app! Go to the app store, search "MyChart", open the app, select [facility], and log in with your MyChart username and password.  Leucovorin Injection What is this medication? LEUCOVORIN (loo koe VOR in) prevents side effects from certain medications, such as methotrexate. It works by increasing folate levels. This helps protect healthy cells in your body. It may also be used to treat anemia caused by low levels of folate. It can also be used with fluorouracil, a type of chemotherapy, to treat colorectal cancer. It works by increasing the effects of fluorouracil in the body. This medicine may be used for other purposes; ask your health care provider or pharmacist if you have questions. What should I tell my care team before I take this medication? They need to know if you   have any of these conditions: Anemia from low levels of vitamin B12 in the blood An unusual or allergic reaction to leucovorin, folic acid, other medications, foods, dyes, or preservatives Pregnant or trying to get pregnant Breastfeeding How should I use this medication? This medication is injected into a  vein or a muscle. It is given by your care team in a hospital or clinic setting. Talk to your care team about the use of this medication in children. Special care may be needed. Overdosage: If you think you have taken too much of this medicine contact a poison control center or emergency room at once. NOTE: This medicine is only for you. Do not share this medicine with others. What if I miss a dose? Keep appointments for follow-up doses. It is important not to miss your dose. Call your care team if you are unable to keep an appointment. What may interact with this medication? Capecitabine Fluorouracil Phenobarbital Phenytoin Primidone Trimethoprim;sulfamethoxazole This list may not describe all possible interactions. Give your health care provider a list of all the medicines, herbs, non-prescription drugs, or dietary supplements you use. Also tell them if you smoke, drink alcohol, or use illegal drugs. Some items may interact with your medicine. What should I watch for while using this medication? Your condition will be monitored carefully while you are receiving this medication. This medication may increase the side effects of 5-fluorouracil. Tell your care team if you have diarrhea or mouth sores that do not get better or that get worse. What side effects may I notice from receiving this medication? Side effects that you should report to your care team as soon as possible: Allergic reactions--skin rash, itching, hives, swelling of the face, lips, tongue, or throat This list may not describe all possible side effects. Call your doctor for medical advice about side effects. You may report side effects to FDA at 1-800-FDA-1088. Where should I keep my medication? This medication is given in a hospital or clinic. It will not be stored at home. NOTE: This sheet is a summary. It may not cover all possible information. If you have questions about this medicine, talk to your doctor, pharmacist, or  health care provider.  2024 Elsevier/Gold Standard (2021-10-04 00:00:00)  Fluorouracil Injection What is this medication? FLUOROURACIL (flure oh YOOR a sil) treats some types of cancer. It works by slowing down the growth of cancer cells. This medicine may be used for other purposes; ask your health care provider or pharmacist if you have questions. COMMON BRAND NAME(S): Adrucil What should I tell my care team before I take this medication? They need to know if you have any of these conditions: Blood disorders Dihydropyrimidine dehydrogenase (DPD) deficiency Infection, such as chickenpox, cold sores, herpes Kidney disease Liver disease Poor nutrition Recent or ongoing radiation therapy An unusual or allergic reaction to fluorouracil, other medications, foods, dyes, or preservatives If you or your partner are pregnant or trying to get pregnant Breast-feeding How should I use this medication? This medication is injected into a vein. It is administered by your care team in a hospital or clinic setting. Talk to your care team about the use of this medication in children. Special care may be needed. Overdosage: If you think you have taken too much of this medicine contact a poison control center or emergency room at once. NOTE: This medicine is only for you. Do not share this medicine with others. What if I miss a dose? Keep appointments for follow-up doses. It is important   not to miss your dose. Call your care team if you are unable to keep an appointment. What may interact with this medication? Do not take this medication with any of the following: Live virus vaccines This medication may also interact with the following: Medications that treat or prevent blood clots, such as warfarin, enoxaparin, dalteparin This list may not describe all possible interactions. Give your health care provider a list of all the medicines, herbs, non-prescription drugs, or dietary supplements you use. Also  tell them if you smoke, drink alcohol, or use illegal drugs. Some items may interact with your medicine. What should I watch for while using this medication? Your condition will be monitored carefully while you are receiving this medication. This medication may make you feel generally unwell. This is not uncommon as chemotherapy can affect healthy cells as well as cancer cells. Report any side effects. Continue your course of treatment even though you feel ill unless your care team tells you to stop. In some cases, you may be given additional medications to help with side effects. Follow all directions for their use. This medication may increase your risk of getting an infection. Call your care team for advice if you get a fever, chills, sore throat, or other symptoms of a cold or flu. Do not treat yourself. Try to avoid being around people who are sick. This medication may increase your risk to bruise or bleed. Call your care team if you notice any unusual bleeding. Be careful brushing or flossing your teeth or using a toothpick because you may get an infection or bleed more easily. If you have any dental work done, tell your dentist you are receiving this medication. Avoid taking medications that contain aspirin, acetaminophen, ibuprofen, naproxen, or ketoprofen unless instructed by your care team. These medications may hide a fever. Do not treat diarrhea with over the counter products. Contact your care team if you have diarrhea that lasts more than 2 days or if it is severe and watery. This medication can make you more sensitive to the sun. Keep out of the sun. If you cannot avoid being in the sun, wear protective clothing and sunscreen. Do not use sun lamps, tanning beds, or tanning booths. Talk to your care team if you or your partner wish to become pregnant or think you might be pregnant. This medication can cause serious birth defects if taken during pregnancy and for 3 months after the last dose.  A reliable form of contraception is recommended while taking this medication and for 3 months after the last dose. Talk to your care team about effective forms of contraception. Do not father a child while taking this medication and for 3 months after the last dose. Use a condom while having sex during this time period. Do not breastfeed while taking this medication. This medication may cause infertility. Talk to your care team if you are concerned about your fertility. What side effects may I notice from receiving this medication? Side effects that you should report to your care team as soon as possible: Allergic reactions--skin rash, itching, hives, swelling of the face, lips, tongue, or throat Heart attack--pain or tightness in the chest, shoulders, arms, or jaw, nausea, shortness of breath, cold or clammy skin, feeling faint or lightheaded Heart failure--shortness of breath, swelling of the ankles, feet, or hands, sudden weight gain, unusual weakness or fatigue Heart rhythm changes--fast or irregular heartbeat, dizziness, feeling faint or lightheaded, chest pain, trouble breathing High ammonia level--unusual weakness or fatigue,   confusion, loss of appetite, nausea, vomiting, seizures Infection--fever, chills, cough, sore throat, wounds that don't heal, pain or trouble when passing urine, general feeling of discomfort or being unwell Low red blood cell level--unusual weakness or fatigue, dizziness, headache, trouble breathing Pain, tingling, or numbness in the hands or feet, muscle weakness, change in vision, confusion or trouble speaking, loss of balance or coordination, trouble walking, seizures Redness, swelling, and blistering of the skin over hands and feet Severe or prolonged diarrhea Unusual bruising or bleeding Side effects that usually do not require medical attention (report to your care team if they continue or are bothersome): Dry skin Headache Increased tears Nausea Pain,  redness, or swelling with sores inside the mouth or throat Sensitivity to light Vomiting This list may not describe all possible side effects. Call your doctor for medical advice about side effects. You may report side effects to FDA at 1-800-FDA-1088. Where should I keep my medication? This medication is given in a hospital or clinic. It will not be stored at home. NOTE: This sheet is a summary. It may not cover all possible information. If you have questions about this medicine, talk to your doctor, pharmacist, or health care provider.  2024 Elsevier/Gold Standard (2021-09-06 00:00:00)      

## 2023-02-13 NOTE — Assessment & Plan Note (Signed)
decreased due to chemotherapy. continue to monitor.

## 2023-02-13 NOTE — Progress Notes (Signed)
Nutrition Follow-up:  Patient with metastatic colon cancer.  S/p transverse colectomy (2015).  Patient receiving 5 FU and bevacizumab.    Met with patient today during infusion. Reports that appetite is decreased. Eats about 2 meals a day.  Drinks ensure 2 times a day.  Daughter does grocery shopping for him and he cooks meals.  Says bowels are moving. Having taste alterations.      Medications: reviewed  Labs: reviewed  Anthropometrics:   Weight 215 lb 6.6 oz on 10/1 238 lb on 3/5 243 lb on 03/14/22  10% weight loss in the last 7 months 12% weight loss in the last year, concerning   NUTRITION DIAGNOSIS: Unintentional weight loss continues   INTERVENTION:  Encouraged patient to increase 350 calorie shake to TID Add snack mid-day (half of sandwich, peanut butter crackers).   Consider trial of appetite stimulant if weight continues to decline.  Patient reluctant to try.    MONITORING, EVALUATION, GOAL: weight loss, intake   NEXT VISIT: Tuesday, Oct 15 during infusion  Azalie Harbeck B. Freida Busman, RD, LDN Registered Dietitian [phone]

## 2023-02-13 NOTE — Assessment & Plan Note (Signed)
Encourage protein rich diet. Follow up with nutritionist

## 2023-02-13 NOTE — Assessment & Plan Note (Signed)
Slow growing small lung nodule, discussed with Dr. Rushie Chestnut.  Will hold off radiation and continue close monitor.

## 2023-02-13 NOTE — Progress Notes (Signed)
Hematology/Oncology Progress note [phone] [phone]    Chief Complaint: Jared Gutierrez is a 82 y.o.  male presents for follow-up of metastatic colon cancer   ASSESSMENT & PLAN:   Cancer Staging  Cancer of transverse colon [name], [name], [name], [name], MD on 10/26/2021   Cancer of transverse colon (HCC) Overall he tolerates chemotherapy.   CEA is relatively stable with small fluctuations. CT images shows slight lung progression - Labs reviewed and discussed with patient Proceed with 5-FU today, he is on palliative radiation to masseteric soft tissue nodule.    Encounter for antineoplastic chemotherapy Chemotherapy plan as listed above.   Anemia due to chemotherapy decreased due to chemotherapy. continue to monitor.  Weight loss Encourage protein rich diet. Follow up with nutritionist   Lung nodules Slow growing small lung nodule, discussed with Dr. Rushie Chestnut.  Will hold off radiation and continue close monitor.  Bilateral leg edema Normal BNP. Obtain Echo  Chemotherapy induced diarrhea Recommend patient to stop laxatives and stool softener.  Recommend imodium PRN  Follow-up 2 weeks lab MD 5-FU  All questions were answered. The patient knows to call the clinic with any problems, questions or concerns.  [name], MD, PhD [facility] Hematology Oncology 02/13/2023     PERTINENT ONCOLOGY HISTORY Jared Gutierrez is a 82 y.o.[place] who has above oncology history reviewed by me today presented for follow up visit for management of Metastatic colon cancer. [name], patient switched care to me on 11/11/20 Extensive medical record review was performed by me  Oncology History  History of colon cancer, stage I  10/02/2014 Pathology Results   [id number] colonoscopy pathology A. CECAL POLYPS; HOT SNARE AND COLD BIOPSY:   - TUBULAR ADENOMA, MULTIPLE FRAGMENTS.  - NEGATIVE FOR HIGH-GRADE DYSPLASIA AND MALIGNANCY.   B. COLON POLYP, TRANSVERSE; COLD BIOPSY:  - TUBULAR ADENOMA.  - NEGATIVE FOR HIGH-GRADE DYSPLASIA AND MALIGNANCY.     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  Tumor extended into the submucosa. Margins were negative. + lymphovascular invasion. 14 lymph nodes were negative. Pathologic stage was T1 N0.  TRANSVERSE COLON, RESECTION:  - INVASIVE ADENOCARCINOMA ARISING IN A 4.6 CM TUBULOVILLOUS  ADENOMA WITH HIGH GRADE DYSPLASIA (MALIGNANT POLYP).  - SEE SUMMARY BELOW.  [name]  ONCOLOGY SUMMARY: COLON AND RECTUM, RESECTION, AJCC 7TH EDITION  Specimen: transverse colon  Procedure: transverse colon resection  Tumor site: splenic flexure  Tumor size: 1.0 cm (invasive component)  Macroscopic tumor perforation: not specified  Histologic type: invasive adenocarcinoma  Histologic grade: moderately differentiated  Microscopic tumor extension: into submucosa  Margins:  Proximal margin: negative       Distal margin: negative       Circumferential (radial) or mesenteric margin: negative       If all margins uninvolved by invasive carcinoma:        Distance of invasive carcinoma from closest margin: 7.5 cm  to distal margin  Treatment effect: not applicable  Lymph-vascular invasion: present  Perineural invasion: not identified  Tumor deposits (discontinuous extramural extension): not  identified  Pathologic staging:       Primary tumor: pT1       Regional lymph nodes: pN0            Number of nodes examined: 14            Number of nodes involved: 0    04/02/2019 Progression   04/02/2019  PET scan  limited evealed a 2.4 x 2.3 cm (SUV 11) hypermetabolic soft tissue density caudal and anterior to the pancreatic neck favored to represent isolated peritoneal or nodal metastasis in the setting of prior transverse colonic resection (expected primary drainage). Although this was immediately adjacent to the pancreas, a fat plane was maintained, arguing strongly against a pancreatic primary. Otherwise, there was no evidence of hypermetabolic metastasis. 04/17/2019 EUS on  revealed a normal esophagus, stomach, duodenum, and pancreas.  There was a 2.4 x 2.4 cm irregular mass in the retroperitoneum adjacent to, but not involving the pancreatic neck.  FNA and core needle biopsy were performed.  Pathology revealed adenocarcinoma compatible with a metastatic lesion of colorectal origin.  Tumor cells were positive for CK20 and CDX2 and negative for CK7.  NGS: [employer] on 05/15/2019 revealed + [id number] and TP53.  Negative results included BRAF V600E, Her2, NRAS, NTRK, PD-L1 (<1%), and TMB 8.7/Mb (intermediate).  MMR testing from his colon resection on 10/16/2013 was intact with a low probability of MSI-H.   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  He received Neulasta after cycle #4 and #5 secondary to progressive leukopenia.  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  He received a truncated  course of FOLFOX with cycle #11 secondary to oxaliplatin reaction.   01/14/2020 Imaging    PET revealed an interval decrease in size and FDG uptake (2.4 x 2.3 cm with SUV 11 to 2.4 x 1.7 cm with SUV 4.09) associated with the previously referenced soft tissue density caudal and anterior to the pancreatic neck suggesting treatment response. There were no new sites of FDG avid tumor.   08/31/2020 Imaging   08/31/2020 Abdomen and pelvis CT revealed increased size of the index soft tissue lesion inferior and anterior to the pancreatic neck (2.9 x 1.8 cm to 3.5 x 2.9 cm). There were similar prominent retroperitoneal lymph nodes without adenopathy by size criteria. There was no new or enlarging abdominal or pelvic lymph nodes. There were no new interval findings. There was similar circumferential wall thickening of a nondistended urinary bladder, which likely accentuated wall thickening There was hepatic steatosis and aortic atherosclerosis.   11/03/2020, PET showed recurrent peritoneal metastasis in the upper abdomen adjacent to the pancreas.  The lesion is 3.8 x 2.9 cm with SUV of 11.3. No evidence of metastatic peritoneal disease elsewhere in the abdomen pelvis.  No evidence of distant metastasis.   11/03/2020 Imaging   PET scan showed recurrent peritoneal metastasis near pancreas. Given that  he has no other distant metastasis. Discussed with radiation oncology. Repeat SBRT may be considered if he does not respond well to systemic chemotherapy.     11/26/2020 -  Chemotherapy   5-FU and bevacizumab.   02/17/2021 Imaging   02/17/2021 CT abdomen pelvis showed partial response. Mild decrease of peritoneal lesion.  Proceed with 5-FU and bevacizumab today.  Given that he is tolerating current regimen with good life quality, partial response.  Shared decision was made to hold off adding additional chemotherapy agents.   06/08/2021, CT chest abdomen pelvis with contrast showed soft tissue mass at the base of  mesentery inferior to the pancreatic neck is unchanged in size.  No progressive disease was identified.   09/07/2021, CT chest abdomen pelvis with contrast showed no substantial changes in size of the mesenteric mass, 2.6 cm.  No suspicious new lesions.  Chronic findings as detailed in the imaging report.   12/05/2021 Imaging   PET scan showed soft tissue mesenteric mass is unchanged in size.  Decreased FDG uptake compared to activity on November 03, 2020.  Fever treated disease.  No new metastatic disease.   03/21/2022 Imaging   CT chest abdomen pelvis  1. Similar size of the peripancreatic presumed peritoneal implant compared to 09/07/2021. 2. No new or progressive disease. 3. Incidental findings, including: Coronary artery atherosclerosis.Aortic Atherosclerosis (ICD10-I70.0). Prostatomegaly with chronic bladder wall thickening, suggesting outlet obstruction.     07/11/2022 Imaging   CT chest abdomen pelvis with contrast showed 1. New 6 mm nodule in the right upper lobe and 2 mm nodule in the left upper lobe. Although small these are concerning. Recommend short follow-up in 3 months 2. Stable low-attenuation lesion along the midbody of the pancreas. Continued attention on follow-up. No new peritoneal mass lesions or ascites. 3. Fatty liver infiltration 4. Colonic surgical changes   10/03/2022 Imaging   CT chest abdomen pelvis w contrast showed 1. Slight interval enlargement and cavitation of a nodule in the right upper lobe, measuring 0.6 cm, previously 0.5 cm. Slight interval enlargement of a left upper lobe nodule measuring 0.3 cm,previously 0.2 cm. Findings are concerning for slowly enlarging small pulmonary metastases. 2. No significant change in a lobulated soft tissue mass within the central small bowel mesentery abutting the inferior aspect of the pancreas, consistent with a stable, treated metastasis. 3. No other evidence of lymphadenopathy or metastatic disease in the chest, abdomen, or  pelvis. 4. Prostatomegaly with diffuse wall thickening of the relatively decompressed urinary bladder, likely secondary to chronic outlet obstruction. 5. Unchanged fibrotic scarring and bronchiectasis of the bilateral lung bases. This could be further assessed by pulmonary referral and dedicated interstitial lung disease protocol CT examination of the chest if and when clinically appropriate in the setting of known metastatic malignancy. Aortic Atherosclerosis    12/27/2022 Imaging   CT chest abdomen pelvis w contrast showed 1. Continued slight interval enlargement of a cavitary nodule in the right upper lobe measuring 0.7 x 0.6 cm, as well as a nodule in the peripheral left upper lobe measuring 0.5 cm. These are highly suspicious for slowly enlarging pulmonary metastases but remain below size threshold for reliable PET-CT characterization. 2. Unchanged, lobulated hypodense soft tissue mass in the central small bowel mesentery, consistent with treated metastatic disease. 3. Status post transverse colon resection and reanastomosis. 4. Thickening of the distal sigmoid colon and rectum, consistent with nonspecific infectious or inflammatory colitis. 5. Prostatomegaly with diffuse thickening of the urinary bladder wall, likely secondary to chronic outlet  obstruction.   Aortic Atherosclerosis (ICD10-I70.0).   01/09/2023 Imaging   PET scan showed Status post transverse colectomy. 4.2 cm soft tissue mass in the central abdominal mesentery, suspicious for residual/recurrent nodal metastasis. Additional 8 mm short axis left para-aortic nodal metastasis. 7 mm cavitary right upper lobe nodule and 5 mm left upper lobe nodule, both beneath the size threshold for PET sensitivity, but suspicious for small pulmonary metastases.   01/30/2023 -  Radiation Therapy   Palliative radiation to mesenteric soft tissue mass.     Other medical problems Chronic lower extremity edema. 12/24/2018, right lower extremity  duplex negative for DVT.  Small right Baker's cyst. 08/16/2019, bilateral lower extremity duplex showed no DVT. 08/16/2019 - 08/17/2019 with right lower extremity cellulitis.  He was unable to bear weight.  He was treated with IV fluids, NSAIDs, colchicine, and broad antibiotics (vancomycin and Cefepime).  He was discharged on indomethacin x 5 days and Keflex 500 mg TID x 5 days.  10/05/2020, colonoscopy showed internal hemorrhoids.  Otherwise normal examination.  INTERVAL HISTORY Jared Gutierrez is a 82 y.o. male who has above history reviewed by me today presents for follow up visit for management of recurrent metastatic colon cancer Patient was accompanied by wife Denies fever, chills, nausea, vomiting, diarrhea, chest pain, shortness of breath, abdominal pain,  He has bilateral lower extremities edema, worse at the end of the day.  + one episode of diarrhea   Review of Systems  Constitutional:  Negative for appetite change, chills, diaphoresis, fever and unexpected weight change.  HENT:   Negative for hearing loss, nosebleeds, sore throat and tinnitus.   Respiratory:  Negative for cough, hemoptysis and shortness of breath.   Cardiovascular:  Negative for chest pain and palpitations.  Gastrointestinal:  Negative for abdominal pain, blood in stool, constipation, diarrhea, nausea and vomiting.  Genitourinary:  Negative for dysuria, frequency and hematuria.   Musculoskeletal:  Positive for arthralgias. Negative for back pain, myalgias and neck pain.  Skin:  Negative for itching and rash.  Neurological:  Positive for numbness. Negative for dizziness and headaches.  Hematological:  Does not bruise/bleed easily.  Psychiatric/Behavioral:  Negative for depression. The patient is not nervous/anxious.       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   Chicken pox    Colon cancer (HCC)    History of  kidney stones    Hypertension    Lipoma of colon    Nephrolithiasis    Nephrolithiasis    Obesity    Shingles    Tubular adenoma of colon    multiple fragments    Past Surgical History:  Procedure Laterality Date   COLON SURGERY     COLONOSCOPY N/A 10/02/2014   Procedure: COLONOSCOPY;  Surgeon: Elnita Maxwell, MD;  Location: [facility] ENDOSCOPY;  Service: Endoscopy;  Laterality: N/A;   COLONOSCOPY WITH PROPOFOL N/A 02/16/2017   Procedure: COLONOSCOPY WITH PROPOFOL;  Surgeon: Scot Jun, MD;  Location: [facility] ENDOSCOPY;  Service: Endoscopy;  Laterality: N/A;   COLONOSCOPY WITH PROPOFOL N/A 10/05/2020   Procedure: COLONOSCOPY WITH PROPOFOL;  Surgeon: Regis Bill, MD;  Location: [facility] ENDOSCOPY;  Service: Endoscopy;  Laterality: N/A;   EUS N/A 04/17/2019   Procedure: FULL UPPER ENDOSCOPIC ULTRASOUND (EUS) RADIAL;  Surgeon: Bearl Mulberry, MD;  Location: [facility] ENDOSCOPY;  Service: Gastroenterology;  Laterality: N/A;   KIDNEY  STONE SURGERY     PARTIAL COLECTOMY  10/17/2013   PORTACATH PLACEMENT Right 06/13/2019   Procedure: INSERTION PORT-A-CATH;  Surgeon: Sung Amabile, DO;  Location: [facility] ORS;  Service: General;  Laterality: Right;    Family History  Problem Relation Age of Onset   Cancer Mother    Breast cancer Mother    COPD Father     Social History:  reports that he has never smoked. He has never used smokeless tobacco. He reports current alcohol use. He reports that he does not use drugs.    Allergies: No Known Allergies  Current Medications: Current Outpatient Medications  Medication Sig Dispense Refill   acetaminophen (TYLENOL) 500 MG tablet Take 500 mg by mouth every 6 (six) hours as needed.     amLODipine (NORVASC) 2.5 MG tablet Take 2.5 mg by mouth daily.     aspirin EC 81 MG tablet Take 81 mg by mouth daily.     Calcium Carbonate (CALCIUM 600 PO) Take 1 tablet by mouth 2 (two) times daily.     Cholecalciferol (VITAMIN D) 125 MCG (5000 UT) CAPS Take  5,000 mg by mouth.     docusate sodium (COLACE) 100 MG capsule Take 1 capsule (100 mg total) by mouth 2 (two) times daily as needed for mild constipation or moderate constipation. 60 capsule 3   HYDROcodone-acetaminophen (NORCO/VICODIN) 5-325 MG tablet Take 1 tablet by mouth every 6 (six) hours as needed for moderate pain (up to 3 doses for moderate pain.).     lidocaine-prilocaine (EMLA) cream APPLY TO AFFECTED AREA ONCE AS DIRECTED 30 g 3   loratadine (CLARITIN) 10 MG tablet Take 10 mg by mouth daily.     olmesartan (BENICAR) 20 MG tablet Take 1 tablet by mouth daily.     ondansetron (ZOFRAN) 8 MG tablet Take 1 tablet (8 mg total) by mouth 2 (two) times daily as needed for refractory nausea / vomiting. Start on day 3 after chemotherapy. 60 tablet 1   potassium chloride SA (KLOR-CON M20) 20 MEQ tablet Take 1 tablet (20 mEq total) by mouth daily. 180 tablet 0   Probiotic Product (PROBIOTIC DAILY PO) Take 1 tablet by mouth daily.     prochlorperazine (COMPAZINE) 10 MG tablet Take 1 tablet (10 mg total) by mouth every 6 (six) hours as needed (NAUSEA). 30 tablet 1   senna-docusate (SENNA S) 8.6-50 MG tablet Take 2 tablets by mouth daily. 60 tablet 3   loperamide (IMODIUM) 2 MG capsule Take 1 capsule (2 mg total) by mouth See admin instructions. Take 2 tablets after first loose stool,  then 1 tablet  after each loose stool; maximum: 8 tablets /day 60 capsule 0   No current facility-administered medications for this visit.   Facility-Administered Medications Ordered in Other Visits  Medication Dose Route Frequency Provider Last Rate Last Admin   0.9 %  sodium chloride infusion   Intravenous Once Corcoran, Melissa C, MD       0.9 %  sodium chloride infusion   Intravenous Continuous Merlene Pulling, Melissa C, MD 10 mL/hr at 12/31/19 1000 New Bag at 10/05/20 1045   heparin lock flush 100 unit/mL  500 Units Intravenous Once Corcoran, Melissa C, MD       sodium chloride flush (NS) 0.9 % injection 10 mL  10 mL  Intracatheter PRN [name], MD   10 mL at 02/01/23 1341    Performance status (ECOG): 1  Vitals Blood pressure 107/77, pulse 61, temperature 97.7 F (36.5 C), resp. rate  18, weight 215 lb 6.4 oz (97.7 kg).   Physical Exam Constitutional:      General: He is not in acute distress.    Appearance: He is obese. He is not diaphoretic.  HENT:     Head: Normocephalic and atraumatic.  Eyes:     General: No scleral icterus. Cardiovascular:     Rate and Rhythm: Normal rate and regular rhythm.     Heart sounds: No murmur heard. Pulmonary:     Effort: Pulmonary effort is normal. No respiratory distress.     Breath sounds: No wheezing.     Comments: Decreased breath sound bilaterally.  Abdominal:     General: Bowel sounds are normal. There is no distension.     Palpations: Abdomen is soft.  Musculoskeletal:        General: Normal range of motion.     Cervical back: Normal range of motion and neck supple.     Comments: Trace edema bilaterally  Skin:    General: Skin is warm and dry.     Findings: No erythema.  Neurological:     Mental Status: He is alert and oriented to person, place, and time. Mental status is at baseline.     Motor: No abnormal muscle tone.  Psychiatric:        Mood and Affect: Mood and affect normal.     Labs were reviewed by me.     Latest Ref Rng & Units 02/13/2023   10:11 AM 01/30/2023   10:10 AM 01/16/2023   10:27 AM  CBC  WBC 4.0 - 10.5 K/uL 4.9  4.7  5.2   Hemoglobin 13.0 - 17.0 g/dL 40.9  81.1  91.4   Hematocrit 39.0 - 52.0 % 34.8  34.0  34.8   Platelets 150 - 400 K/uL 184  162  189       Latest Ref Rng & Units 02/13/2023   10:11 AM 01/30/2023   10:10 AM 01/16/2023   10:27 AM  CMP  Glucose 70 - 99 mg/dL 782  94  98   BUN 8 - 23 mg/dL 12  8  9    Creatinine 0.61 - 1.24 mg/dL 9.56  2.13  0.86   Sodium 135 - 145 mmol/L 133  136  135   Potassium 3.5 - 5.1 mmol/L 3.9  3.4  3.6   Chloride 98 - 111 mmol/L 100  106  103   CO2 22 - 32 mmol/L 24  25  25     Calcium 8.9 - 10.3 mg/dL 9.0  8.7  9.3   Total Protein 6.5 - 8.1 g/dL 6.3  5.7  6.0   Total Bilirubin 0.3 - 1.2 mg/dL 0.8  1.1  1.1   Alkaline Phos 38 - 126 U/L 39  37  38   AST 15 - 41 U/L 28  33  24   ALT 0 - 44 U/L 15  17  13

## 2023-02-13 NOTE — Assessment & Plan Note (Signed)
Overall he tolerates chemotherapy.   CEA is relatively stable with small fluctuations. CT images shows slight lung progression - Labs reviewed and discussed with patient Proceed with 5-FU today, he is on palliative radiation to masseteric soft tissue nodule.

## 2023-02-14 ENCOUNTER — Ambulatory Visit
Admission: RE | Admit: 2023-02-14 | Discharge: 2023-02-14 | Disposition: A | Discharge status: Home / Self Care | Payer: Medicare Other | Source: Ambulatory Visit | Attending: Radiation Oncology | Admitting: Radiation Oncology

## 2023-02-14 ENCOUNTER — Other Ambulatory Visit: Payer: Self-pay

## 2023-02-14 DIAGNOSIS — Z510 Encounter for antineoplastic radiation therapy: Secondary | ICD-10-CM | POA: Diagnosis not present

## 2023-02-14 LAB — RAD ONC ARIA SESSION SUMMARY
Course Elapsed Days: 15
Plan Fractions Treated to Date: 12
Plan Prescribed Dose Per Fraction: 1.8 Gy
Plan Total Fractions Prescribed: 25
Plan Total Prescribed Dose: 45 Gy
Reference Point Dosage Given to Date: 21.6 Gy
Reference Point Session Dosage Given: 1.8 Gy
Session Number: 12

## 2023-02-15 ENCOUNTER — Inpatient Hospital Stay: Payer: Medicare Other

## 2023-02-15 ENCOUNTER — Ambulatory Visit
Admission: RE | Admit: 2023-02-15 | Discharge: 2023-02-15 | Disposition: A | Discharge status: Home / Self Care | Payer: Medicare Other | Source: Ambulatory Visit | Attending: Radiation Oncology | Admitting: Radiation Oncology

## 2023-02-15 ENCOUNTER — Other Ambulatory Visit: Payer: Self-pay

## 2023-02-15 DIAGNOSIS — C786 Secondary malignant neoplasm of retroperitoneum and peritoneum: Secondary | ICD-10-CM

## 2023-02-15 DIAGNOSIS — Z510 Encounter for antineoplastic radiation therapy: Secondary | ICD-10-CM | POA: Diagnosis not present

## 2023-02-15 DIAGNOSIS — Z85038 Personal history of other malignant neoplasm of large intestine: Secondary | ICD-10-CM

## 2023-02-15 LAB — RAD ONC ARIA SESSION SUMMARY
Course Elapsed Days: 16
Plan Fractions Treated to Date: 13
Plan Prescribed Dose Per Fraction: 1.8 Gy
Plan Total Fractions Prescribed: 25
Plan Total Prescribed Dose: 45 Gy
Reference Point Dosage Given to Date: 23.4 Gy
Reference Point Session Dosage Given: 1.8 Gy
Session Number: 13

## 2023-02-15 MED ORDER — HEPARIN SOD (PORK) LOCK FLUSH 100 UNIT/ML IV SOLN
500.0000 [IU] | Freq: Once | INTRAVENOUS | Status: AC | PRN
  Administered 2023-02-15: 500 [IU]
  Filled 2023-02-15: qty 5

## 2023-02-16 ENCOUNTER — Ambulatory Visit: Payer: Medicare Other

## 2023-02-16 ENCOUNTER — Ambulatory Visit
Admission: RE | Admit: 2023-02-16 | Discharge: 2023-02-16 | Disposition: A | Discharge status: Home / Self Care | Payer: Medicare Other | Source: Ambulatory Visit | Attending: Radiation Oncology | Admitting: Radiation Oncology

## 2023-02-16 ENCOUNTER — Other Ambulatory Visit: Payer: Self-pay

## 2023-02-16 DIAGNOSIS — Z510 Encounter for antineoplastic radiation therapy: Secondary | ICD-10-CM | POA: Diagnosis not present

## 2023-02-16 LAB — RAD ONC ARIA SESSION SUMMARY
Course Elapsed Days: 17
Plan Fractions Treated to Date: 14
Plan Prescribed Dose Per Fraction: 1.8 Gy
Plan Total Fractions Prescribed: 25
Plan Total Prescribed Dose: 45 Gy
Reference Point Dosage Given to Date: 25.2 Gy
Reference Point Session Dosage Given: 1.8 Gy
Session Number: 14

## 2023-02-19 ENCOUNTER — Other Ambulatory Visit: Payer: Self-pay

## 2023-02-19 ENCOUNTER — Ambulatory Visit
Admission: RE | Admit: 2023-02-19 | Discharge: 2023-02-19 | Disposition: A | Discharge status: Home / Self Care | Payer: Medicare Other | Source: Ambulatory Visit | Attending: Radiation Oncology | Admitting: Radiation Oncology

## 2023-02-19 DIAGNOSIS — Z510 Encounter for antineoplastic radiation therapy: Secondary | ICD-10-CM | POA: Diagnosis not present

## 2023-02-19 LAB — RAD ONC ARIA SESSION SUMMARY
Course Elapsed Days: 20
Plan Fractions Treated to Date: 15
Plan Prescribed Dose Per Fraction: 1.8 Gy
Plan Total Fractions Prescribed: 25
Plan Total Prescribed Dose: 45 Gy
Reference Point Dosage Given to Date: 27 Gy
Reference Point Session Dosage Given: 1.8 Gy
Session Number: 15

## 2023-02-20 ENCOUNTER — Other Ambulatory Visit: Payer: Self-pay

## 2023-02-20 ENCOUNTER — Ambulatory Visit
Admission: RE | Admit: 2023-02-20 | Discharge: 2023-02-20 | Disposition: A | Discharge status: Home / Self Care | Payer: Medicare Other | Source: Ambulatory Visit | Attending: Radiation Oncology | Admitting: Radiation Oncology

## 2023-02-20 DIAGNOSIS — Z510 Encounter for antineoplastic radiation therapy: Secondary | ICD-10-CM | POA: Diagnosis not present

## 2023-02-20 LAB — RAD ONC ARIA SESSION SUMMARY
Course Elapsed Days: 21
Plan Fractions Treated to Date: 16
Plan Prescribed Dose Per Fraction: 1.8 Gy
Plan Total Fractions Prescribed: 25
Plan Total Prescribed Dose: 45 Gy
Reference Point Dosage Given to Date: 28.8 Gy
Reference Point Session Dosage Given: 1.8 Gy
Session Number: 16

## 2023-02-21 ENCOUNTER — Other Ambulatory Visit: Payer: Self-pay

## 2023-02-21 ENCOUNTER — Ambulatory Visit
Admission: RE | Admit: 2023-02-21 | Discharge: 2023-02-21 | Disposition: A | Discharge status: Home / Self Care | Payer: Medicare Other | Source: Ambulatory Visit | Attending: Radiation Oncology | Admitting: Radiation Oncology

## 2023-02-21 DIAGNOSIS — Z510 Encounter for antineoplastic radiation therapy: Secondary | ICD-10-CM | POA: Diagnosis not present

## 2023-02-21 LAB — RAD ONC ARIA SESSION SUMMARY
Course Elapsed Days: 22
Plan Fractions Treated to Date: 17
Plan Prescribed Dose Per Fraction: 1.8 Gy
Plan Total Fractions Prescribed: 25
Plan Total Prescribed Dose: 45 Gy
Reference Point Dosage Given to Date: 30.6 Gy
Reference Point Session Dosage Given: 1.8 Gy
Session Number: 17

## 2023-02-22 ENCOUNTER — Ambulatory Visit
Admission: RE | Admit: 2023-02-22 | Discharge: 2023-02-22 | Disposition: A | Discharge status: Home / Self Care | Payer: Medicare Other | Source: Ambulatory Visit | Attending: Oncology | Admitting: Oncology

## 2023-02-22 ENCOUNTER — Other Ambulatory Visit: Payer: Self-pay

## 2023-02-22 ENCOUNTER — Ambulatory Visit
Admission: RE | Admit: 2023-02-22 | Discharge: 2023-02-22 | Disposition: A | Discharge status: Home / Self Care | Payer: Medicare Other | Source: Ambulatory Visit | Attending: Radiation Oncology | Admitting: Radiation Oncology

## 2023-02-22 DIAGNOSIS — R609 Edema, unspecified: Secondary | ICD-10-CM | POA: Insufficient documentation

## 2023-02-22 DIAGNOSIS — I10 Essential (primary) hypertension: Secondary | ICD-10-CM | POA: Insufficient documentation

## 2023-02-22 DIAGNOSIS — R600 Localized edema: Secondary | ICD-10-CM | POA: Diagnosis not present

## 2023-02-22 DIAGNOSIS — Z510 Encounter for antineoplastic radiation therapy: Secondary | ICD-10-CM | POA: Diagnosis not present

## 2023-02-22 DIAGNOSIS — M7989 Other specified soft tissue disorders: Secondary | ICD-10-CM | POA: Diagnosis present

## 2023-02-22 LAB — ECHOCARDIOGRAM COMPLETE
Area-P 1/2: 2.07 cm2
MV VTI: 2.1 cm2
S' Lateral: 2.1 cm

## 2023-02-22 LAB — RAD ONC ARIA SESSION SUMMARY
Course Elapsed Days: 23
Plan Fractions Treated to Date: 18
Plan Prescribed Dose Per Fraction: 1.8 Gy
Plan Total Fractions Prescribed: 25
Plan Total Prescribed Dose: 45 Gy
Reference Point Dosage Given to Date: 32.4 Gy
Reference Point Session Dosage Given: 1.8 Gy
Session Number: 18

## 2023-02-23 ENCOUNTER — Encounter: Payer: Self-pay | Admitting: Oncology

## 2023-02-23 ENCOUNTER — Ambulatory Visit
Admission: RE | Admit: 2023-02-23 | Discharge: 2023-02-23 | Disposition: A | Discharge status: Home / Self Care | Payer: Medicare Other | Source: Ambulatory Visit | Attending: Radiation Oncology | Admitting: Radiation Oncology

## 2023-02-23 ENCOUNTER — Other Ambulatory Visit: Payer: Self-pay

## 2023-02-23 DIAGNOSIS — Z510 Encounter for antineoplastic radiation therapy: Secondary | ICD-10-CM | POA: Diagnosis not present

## 2023-02-23 LAB — RAD ONC ARIA SESSION SUMMARY
Course Elapsed Days: 24
Plan Fractions Treated to Date: 19
Plan Prescribed Dose Per Fraction: 1.8 Gy
Plan Total Fractions Prescribed: 25
Plan Total Prescribed Dose: 45 Gy
Reference Point Dosage Given to Date: 34.2 Gy
Reference Point Session Dosage Given: 1.8 Gy
Session Number: 19

## 2023-02-26 ENCOUNTER — Ambulatory Visit
Admission: RE | Admit: 2023-02-26 | Discharge: 2023-02-26 | Disposition: A | Discharge status: Home / Self Care | Payer: Medicare Other | Source: Ambulatory Visit | Attending: Radiation Oncology | Admitting: Radiation Oncology

## 2023-02-26 ENCOUNTER — Other Ambulatory Visit: Payer: Self-pay

## 2023-02-26 DIAGNOSIS — Z510 Encounter for antineoplastic radiation therapy: Secondary | ICD-10-CM | POA: Diagnosis not present

## 2023-02-26 LAB — RAD ONC ARIA SESSION SUMMARY
Course Elapsed Days: 27
Plan Fractions Treated to Date: 20
Plan Prescribed Dose Per Fraction: 1.8 Gy
Plan Total Fractions Prescribed: 25
Plan Total Prescribed Dose: 45 Gy
Reference Point Dosage Given to Date: 36 Gy
Reference Point Session Dosage Given: 1.8 Gy
Session Number: 20

## 2023-02-27 ENCOUNTER — Inpatient Hospital Stay: Payer: Medicare Other

## 2023-02-27 ENCOUNTER — Ambulatory Visit
Admission: RE | Admit: 2023-02-27 | Discharge: 2023-02-27 | Disposition: A | Discharge status: Home / Self Care | Payer: Medicare Other | Source: Ambulatory Visit | Attending: Radiation Oncology | Admitting: Radiation Oncology

## 2023-02-27 ENCOUNTER — Encounter: Payer: Self-pay | Admitting: Oncology

## 2023-02-27 ENCOUNTER — Other Ambulatory Visit: Payer: Self-pay

## 2023-02-27 ENCOUNTER — Inpatient Hospital Stay (HOSPITAL_BASED_OUTPATIENT_CLINIC_OR_DEPARTMENT_OTHER): Payer: Medicare Other | Admitting: Oncology

## 2023-02-27 VITALS — BP 138/79 | HR 62 | Temp 96.8°F | Resp 18 | Wt 209.9 lb

## 2023-02-27 DIAGNOSIS — C184 Malignant neoplasm of transverse colon: Secondary | ICD-10-CM | POA: Diagnosis not present

## 2023-02-27 DIAGNOSIS — Z85038 Personal history of other malignant neoplasm of large intestine: Secondary | ICD-10-CM

## 2023-02-27 DIAGNOSIS — K521 Toxic gastroenteritis and colitis: Secondary | ICD-10-CM

## 2023-02-27 DIAGNOSIS — C786 Secondary malignant neoplasm of retroperitoneum and peritoneum: Secondary | ICD-10-CM

## 2023-02-27 DIAGNOSIS — Z510 Encounter for antineoplastic radiation therapy: Secondary | ICD-10-CM | POA: Diagnosis not present

## 2023-02-27 DIAGNOSIS — D6481 Anemia due to antineoplastic chemotherapy: Secondary | ICD-10-CM

## 2023-02-27 DIAGNOSIS — R600 Localized edema: Secondary | ICD-10-CM

## 2023-02-27 DIAGNOSIS — R918 Other nonspecific abnormal finding of lung field: Secondary | ICD-10-CM

## 2023-02-27 DIAGNOSIS — R634 Abnormal weight loss: Secondary | ICD-10-CM

## 2023-02-27 DIAGNOSIS — T451X5A Adverse effect of antineoplastic and immunosuppressive drugs, initial encounter: Secondary | ICD-10-CM

## 2023-02-27 DIAGNOSIS — Z5111 Encounter for antineoplastic chemotherapy: Secondary | ICD-10-CM

## 2023-02-27 LAB — COMPREHENSIVE METABOLIC PANEL
ALT: 11 U/L (ref 0–44)
AST: 25 U/L (ref 15–41)
Albumin: 3.3 g/dL — ABNORMAL LOW (ref 3.5–5.0)
Alkaline Phosphatase: 37 U/L — ABNORMAL LOW (ref 38–126)
Anion gap: 6 (ref 5–15)
BUN: 17 mg/dL (ref 8–23)
CO2: 24 mmol/L (ref 22–32)
Calcium: 9.1 mg/dL (ref 8.9–10.3)
Chloride: 106 mmol/L (ref 98–111)
Creatinine, Ser: 0.75 mg/dL (ref 0.61–1.24)
GFR, Estimated: >60 mL/min (ref >60)
Glucose, Bld: 108 mg/dL — ABNORMAL HIGH (ref 70–99)
Potassium: 3.8 mmol/L (ref 3.5–5.1)
Sodium: 136 mmol/L (ref 135–145)
Total Bilirubin: 0.6 mg/dL (ref 0.3–1.2)
Total Protein: 6.5 g/dL (ref 6.5–8.1)

## 2023-02-27 LAB — CBC WITH DIFFERENTIAL/PLATELET
Abs Immature Granulocytes: 0.02 10*3/uL (ref 0.00–0.07)
Basophils Absolute: 0 10*3/uL (ref 0.0–0.1)
Basophils Relative: 1 %
Eosinophils Absolute: 0.3 10*3/uL (ref 0.0–0.5)
Eosinophils Relative: 6 %
HCT: 34.2 % — ABNORMAL LOW (ref 39.0–52.0)
Hemoglobin: 11 g/dL — ABNORMAL LOW (ref 13.0–17.0)
Immature Granulocytes: 0 %
Lymphocytes Relative: 13 %
Lymphs Abs: 0.6 10*3/uL — ABNORMAL LOW (ref 0.7–4.0)
MCH: 30 pg (ref 26.0–34.0)
MCHC: 32.2 g/dL (ref 30.0–36.0)
MCV: 93.2 fL (ref 80.0–100.0)
Monocytes Absolute: 0.7 10*3/uL (ref 0.1–1.0)
Monocytes Relative: 13 %
Neutro Abs: 3.3 10*3/uL (ref 1.7–7.7)
Neutrophils Relative %: 67 %
Platelets: 179 10*3/uL (ref 150–400)
RBC: 3.67 MIL/uL — ABNORMAL LOW (ref 4.22–5.81)
RDW: 16.3 % — ABNORMAL HIGH (ref 11.5–15.5)
WBC: 4.9 10*3/uL (ref 4.0–10.5)
nRBC: 0 % (ref 0.0–0.2)

## 2023-02-27 LAB — RAD ONC ARIA SESSION SUMMARY
Course Elapsed Days: 28
Plan Fractions Treated to Date: 21
Plan Prescribed Dose Per Fraction: 1.8 Gy
Plan Total Fractions Prescribed: 25
Plan Total Prescribed Dose: 45 Gy
Reference Point Dosage Given to Date: 37.8 Gy
Reference Point Session Dosage Given: 1.8 Gy
Session Number: 21

## 2023-02-27 MED ORDER — MEGESTROL ACETATE 40 MG PO TABS: 80.0000 mg | ORAL_TABLET | Freq: Two times a day (BID) | ORAL | 0 refills | Status: DC

## 2023-02-27 MED ORDER — SODIUM CHLORIDE 0.9 % IV SOLN
Freq: Once | INTRAVENOUS | Status: DC
  Filled 2023-02-27: qty 250

## 2023-02-27 MED ORDER — SODIUM CHLORIDE 0.9 % IV SOLN
2400.0000 mg/m2 | INTRAVENOUS | Status: DC
  Administered 2023-02-27: 5000 mg via INTRAVENOUS
  Filled 2023-02-27: qty 100

## 2023-02-27 MED ORDER — SODIUM CHLORIDE 0.9 % IV SOLN: 400.0000 mg/m2 | Freq: Once | INTRAVENOUS | Status: DC

## 2023-02-27 MED ORDER — SODIUM CHLORIDE 0.9 % IV SOLN: 5.0000 mg/kg | Freq: Once | INTRAVENOUS | Status: DC

## 2023-02-27 NOTE — Progress Notes (Signed)
Hematology/Oncology Progress note [phone] Fax:([phone]    Chief Complaint: Jared Gutierrez is a 82 y.o.  male presents for follow-up of metastatic colon cancer   ASSESSMENT & PLAN:   Cancer Staging  Cancer of transverse colon [facility]) Staging form: Colon and Rectum, [name], [name], [name], MD on 10/26/2021   Cancer of transverse colon (HCC) Overall he tolerates chemotherapy.   CEA is relatively stable with small fluctuations. CT images shows slight lung progression - Labs reviewed and discussed with patient Proceed with 5-FU today, he is on palliative radiation to masseteric soft tissue nodule.  Last radiation 03/05/2023. After that he will have a chemo break.   Encounter for antineoplastic chemotherapy Chemotherapy plan as listed above.   Anemia due to chemotherapy decreased due to chemotherapy. continue to monitor.  Chemotherapy induced diarrhea Recommend patient to stop laxatives and stool softener.  Recommend imodium PRN  Weight loss Encourage protein rich diet. Follow up with nutritionist  Recommend Megace 80 mg twice daily as appetite stimulant.  Lung nodules Slow growing small lung nodule, discussed with Dr. Rushie Chestnut.  Will hold off radiation and continue close monitor.  Bilateral leg edema Echo showed normal LVEF.  Grade 1 diastolic dysfunction.  Follow-up 2 months All questions were answered. The patient knows to call the clinic with any problems, questions or concerns.  [name], MD, PhD [facility] Hematology Oncology 02/27/2023     PERTINENT ONCOLOGY HISTORY Jared Gutierrez is a 82 y.o.[place] who has above oncology history reviewed by me today presented for follow up visit for management of Metastatic colon cancer. [name], patient switched care to me on 11/11/20 Extensive medical record review was performed by me  Oncology History   History of colon cancer, stage I  10/02/2014 Pathology Results   [id number] colonoscopy pathology A. CECAL POLYPS; HOT SNARE AND COLD BIOPSY:  - TUBULAR ADENOMA, MULTIPLE FRAGMENTS.  - NEGATIVE FOR HIGH-GRADE DYSPLASIA AND MALIGNANCY.   B. COLON POLYP, TRANSVERSE; COLD BIOPSY:  - TUBULAR ADENOMA.  - NEGATIVE FOR HIGH-GRADE DYSPLASIA AND MALIGNANCY.     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  Tumor extended into the submucosa. Margins were negative. + lymphovascular invasion. 14 lymph nodes were negative. Pathologic stage was T1 N0.  TRANSVERSE COLON, RESECTION:  - INVASIVE ADENOCARCINOMA ARISING IN A 4.6 CM TUBULOVILLOUS  ADENOMA WITH HIGH GRADE DYSPLASIA (MALIGNANT POLYP).  - SEE SUMMARY BELOW.  [name]  ONCOLOGY SUMMARY: COLON AND RECTUM, RESECTION, AJCC 7TH EDITION  Specimen: transverse colon  Procedure: transverse colon resection  Tumor site: splenic flexure  Tumor size: 1.0 cm (invasive component)  Macroscopic tumor  perforation: not specified  Histologic type: invasive adenocarcinoma  Histologic grade: moderately differentiated  Microscopic tumor extension: into submucosa  Margins:       Proximal margin: negative        Distal margin: negative       Circumferential (radial) or mesenteric margin: negative       If all margins uninvolved by invasive carcinoma:       Distance of invasive carcinoma from closest margin: 7.5 cm  to distal margin  Treatment effect: not applicable  Lymph-vascular invasion: present  Perineural invasion: not identified  Tumor deposits (discontinuous extramural extension): not  identified  Pathologic staging:       Primary tumor: pT1       Regional lymph nodes: pN0            Number of nodes examined: 14            Number of nodes involved: 0    04/02/2019 Progression   04/02/2019  PET scan  limited evealed a 2.4 x 2.3 cm (SUV 11) hypermetabolic soft tissue density caudal and anterior to the pancreatic neck favored to represent isolated peritoneal or nodal metastasis in the setting of prior transverse colonic resection (expected primary drainage). Although this was immediately adjacent to the pancreas, a fat plane was maintained, arguing strongly against a pancreatic primary. Otherwise, there was no evidence of hypermetabolic metastasis. 04/17/2019 EUS on  revealed a normal esophagus, stomach, duodenum, and pancreas.  There was a 2.4 x 2.4 cm irregular mass in the retroperitoneum adjacent to, but not involving the pancreatic neck.  FNA and core needle biopsy were performed.  Pathology revealed adenocarcinoma compatible with a metastatic lesion of colorectal origin.  Tumor cells were positive for CK20 and CDX2 and negative for CK7.  NGS: [employer] on 05/15/2019 revealed + [id number] and TP53.  Negative results included BRAF V600E, Her2, NRAS, NTRK, PD-L1 (<1%), and TMB 8.7/Mb (intermediate).  MMR testing from his colon resection on 10/16/2013 was intact with a low probability of MSI-H.   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  He  received Neulasta after cycle #4 and #5 secondary to progressive leukopenia.  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  He received a truncated course of FOLFOX with cycle #11 secondary to oxaliplatin reaction.   01/14/2020 Imaging    PET revealed an interval decrease in size and FDG uptake (2.4 x 2.3 cm with SUV 11 to 2.4 x 1.7 cm with SUV 4.09) associated with the previously referenced soft tissue density caudal and anterior to the pancreatic neck suggesting treatment response. There were no new sites of FDG avid tumor.   08/31/2020 Imaging   08/31/2020 Abdomen and pelvis CT revealed increased size of the index soft tissue lesion inferior and anterior to the pancreatic neck (2.9 x 1.8 cm to 3.5 x 2.9 cm). There were similar prominent retroperitoneal lymph nodes without adenopathy by size criteria. There was no new or enlarging abdominal or pelvic lymph nodes. There were no new interval findings. There was similar circumferential wall thickening of a nondistended urinary bladder, which likely accentuated wall thickening There was hepatic steatosis and aortic atherosclerosis.   11/03/2020, PET showed recurrent peritoneal metastasis in the upper abdomen adjacent to the pancreas.  The lesion is 3.8 x 2.9 cm with SUV of 11.3. No evidence of metastatic peritoneal disease  elsewhere in the abdomen pelvis.  No evidence of distant metastasis.   11/03/2020 Imaging   PET scan showed recurrent peritoneal metastasis near pancreas. Given that he has no other distant metastasis. Discussed with radiation oncology. Repeat SBRT may be considered if he does not respond well to systemic chemotherapy.     11/26/2020 -  Chemotherapy   5-FU and bevacizumab.   02/17/2021 Imaging   02/17/2021 CT abdomen pelvis showed partial response. Mild decrease of peritoneal lesion.  Proceed with 5-FU and bevacizumab today.  Given that he is tolerating current regimen with good life quality, partial response.  Shared decision  was made to hold off adding additional chemotherapy agents.   06/08/2021, CT chest abdomen pelvis with contrast showed soft tissue mass at the base of mesentery inferior to the pancreatic neck is unchanged in size.  No progressive disease was identified.   09/07/2021, CT chest abdomen pelvis with contrast showed no substantial changes in size of the mesenteric mass, 2.6 cm.  No suspicious new lesions.  Chronic findings as detailed in the imaging report.   12/05/2021 Imaging   PET scan showed soft tissue mesenteric mass is unchanged in size.  Decreased FDG uptake compared to activity on November 03, 2020.  Fever treated disease.  No new metastatic disease.   03/21/2022 Imaging   CT chest abdomen pelvis  1. Similar size of the peripancreatic presumed peritoneal implant compared to 09/07/2021. 2. No new or progressive disease. 3. Incidental findings, including: Coronary artery atherosclerosis.Aortic Atherosclerosis (ICD10-I70.0). Prostatomegaly with chronic bladder wall thickening, suggesting outlet obstruction.     07/11/2022 Imaging   CT chest abdomen pelvis with contrast showed 1. New 6 mm nodule in the right upper lobe and 2 mm nodule in the left upper lobe. Although small these are concerning. Recommend short follow-up in 3 months 2. Stable low-attenuation lesion along the midbody of the pancreas. Continued attention on follow-up. No new peritoneal mass lesions or ascites. 3. Fatty liver infiltration 4. Colonic surgical changes   10/03/2022 Imaging   CT chest abdomen pelvis w contrast showed 1. Slight interval enlargement and cavitation of a nodule in the right upper lobe, measuring 0.6 cm, previously 0.5 cm. Slight interval enlargement of a left upper lobe nodule measuring 0.3 cm,previously 0.2 cm. Findings are concerning for slowly enlarging small pulmonary metastases. 2. No significant change in a lobulated soft tissue mass within the central small bowel mesentery abutting the inferior aspect of  the pancreas, consistent with a stable, treated metastasis. 3. No other evidence of lymphadenopathy or metastatic disease in the chest, abdomen, or pelvis. 4. Prostatomegaly with diffuse wall thickening of the relatively decompressed urinary bladder, likely secondary to chronic outlet obstruction. 5. Unchanged fibrotic scarring and bronchiectasis of the bilateral lung bases. This could be further assessed by pulmonary referral and dedicated interstitial lung disease protocol CT examination of the chest if and when clinically appropriate in the setting of known metastatic malignancy. Aortic Atherosclerosis    12/27/2022 Imaging   CT chest abdomen pelvis w contrast showed 1. Continued slight interval enlargement of a cavitary nodule in the right upper lobe measuring 0.7 x 0.6 cm, as well as a nodule in the peripheral left upper lobe measuring 0.5 cm. These are highly suspicious for slowly enlarging pulmonary metastases but remain below size threshold for reliable PET-CT characterization. 2. Unchanged, lobulated hypodense soft tissue mass in the central small bowel mesentery, consistent with treated metastatic disease. 3. Status post transverse colon resection and reanastomosis. 4. Thickening of the  distal sigmoid colon and rectum, consistent with nonspecific infectious or inflammatory colitis. 5. Prostatomegaly with diffuse thickening of the urinary bladder wall, likely secondary to chronic outlet obstruction.   Aortic Atherosclerosis (ICD10-I70.0).   01/09/2023 Imaging   PET scan showed Status post transverse colectomy. 4.2 cm soft tissue mass in the central abdominal mesentery, suspicious for residual/recurrent nodal metastasis. Additional 8 mm short axis left para-aortic nodal metastasis. 7 mm cavitary right upper lobe nodule and 5 mm left upper lobe nodule, both beneath the size threshold for PET sensitivity, but suspicious for small pulmonary metastases.   01/30/2023 -  Radiation Therapy    Palliative radiation to mesenteric soft tissue mass.     Other medical problems Chronic lower extremity edema. 12/24/2018, right lower extremity duplex negative for DVT.  Small right Baker's cyst. 08/16/2019, bilateral lower extremity duplex showed no DVT. 08/16/2019 - 08/17/2019 with right lower extremity cellulitis.  He was unable to bear weight.  He was treated with IV fluids, NSAIDs, colchicine, and broad antibiotics (vancomycin and Cefepime).  He was discharged on indomethacin x 5 days and Keflex 500 mg TID x 5 days.  10/05/2020, colonoscopy showed internal hemorrhoids.  Otherwise normal examination.  INTERVAL HISTORY Jared Gutierrez is a 82 y.o. male who has above history reviewed by me today presents for follow up visit for management of recurrent metastatic colon cancer Patient was accompanied by wife Denies fever, chills, nausea, vomiting, diarrhea, chest pain, shortness of breath, abdominal pain,  He has bilateral lower extremities edema, worse at the end of the day.  Appetite is not good.  He has lost weight.   Review of Systems  Constitutional:  Negative for appetite change, chills, diaphoresis, fever and unexpected weight change.  HENT:   Negative for hearing loss, nosebleeds, sore throat and tinnitus.   Respiratory:  Negative for cough, hemoptysis and shortness of breath.   Cardiovascular:  Negative for chest pain and palpitations.  Gastrointestinal:  Negative for abdominal pain, blood in stool, constipation, diarrhea, nausea and vomiting.  Genitourinary:  Negative for dysuria, frequency and hematuria.   Musculoskeletal:  Positive for arthralgias. Negative for back pain, myalgias and neck pain.  Skin:  Negative for itching and rash.  Neurological:  Positive for numbness. Negative for dizziness and headaches.  Hematological:  Does not bruise/bleed easily.  Psychiatric/Behavioral:  Negative for depression. The patient is not nervous/anxious.       Past Medical History:   Diagnosis Date   Arthritis    OSTEOARTHRITIS   Cancer (HCC)    Cavitary lesion of lung    RIGHT LOWER LOBE   Chicken pox    Colon cancer (HCC)    History of kidney stones    Hypertension    Lipoma of colon    Nephrolithiasis    Nephrolithiasis    Obesity    Shingles    Tubular adenoma of colon    multiple fragments    Past Surgical History:  Procedure Laterality Date   COLON SURGERY     COLONOSCOPY N/A 10/02/2014   Procedure: COLONOSCOPY;  Surgeon: Elnita Maxwell, MD;  Location: [facility] ENDOSCOPY;  Service: Endoscopy;  Laterality: N/A;   COLONOSCOPY WITH PROPOFOL N/A 02/16/2017   Procedure: COLONOSCOPY WITH PROPOFOL;  Surgeon: Scot Jun, MD;  Location: [facility] ENDOSCOPY;  Service: Endoscopy;  Laterality: N/A;   COLONOSCOPY WITH PROPOFOL N/A 10/05/2020   Procedure: COLONOSCOPY WITH PROPOFOL;  Surgeon: Regis Bill, MD;  Location: [facility] ENDOSCOPY;  Service: Endoscopy;  Laterality: N/A;  EUS N/A 04/17/2019   Procedure: FULL UPPER ENDOSCOPIC ULTRASOUND (EUS) RADIAL;  Surgeon: Bearl Mulberry, MD;  Location: [facility] ENDOSCOPY;  Service: Gastroenterology;  Laterality: N/A;   KIDNEY STONE SURGERY     PARTIAL COLECTOMY  10/17/2013   PORTACATH PLACEMENT Right 06/13/2019   Procedure: INSERTION PORT-A-CATH;  Surgeon: Sung Amabile, DO;  Location: [facility] ORS;  Service: General;  Laterality: Right;    Family History  Problem Relation Age of Onset   Cancer Mother    Breast cancer Mother    COPD Father     Social History:  reports that he has never smoked. He has never used smokeless tobacco. He reports current alcohol use. He reports that he does not use drugs.    Allergies: No Known Allergies  Current Medications: Current Outpatient Medications  Medication Sig Dispense Refill   acetaminophen (TYLENOL) 500 MG tablet Take 500 mg by mouth every 6 (six) hours as needed.     amLODipine (NORVASC) 2.5 MG tablet Take 2.5 mg by mouth daily.     aspirin EC 81 MG tablet Take 81  mg by mouth daily.     Calcium Carbonate (CALCIUM 600 PO) Take 1 tablet by mouth 2 (two) times daily.     Cholecalciferol (VITAMIN D) 125 MCG (5000 UT) CAPS Take 5,000 mg by mouth.     docusate sodium (COLACE) 100 MG capsule Take 1 capsule (100 mg total) by mouth 2 (two) times daily as needed for mild constipation or moderate constipation. 60 capsule 3   HYDROcodone-acetaminophen (NORCO/VICODIN) 5-325 MG tablet Take 1 tablet by mouth every 6 (six) hours as needed for moderate pain (up to 3 doses for moderate pain.).     lidocaine-prilocaine (EMLA) cream APPLY TO AFFECTED AREA ONCE AS DIRECTED 30 g 3   loratadine (CLARITIN) 10 MG tablet Take 10 mg by mouth daily.     megestrol (MEGACE) 40 MG tablet Take 2 tablets (80 mg total) by mouth 2 (two) times daily. 120 tablet 0   olmesartan (BENICAR) 20 MG tablet Take 1 tablet by mouth daily.     potassium chloride SA (KLOR-CON M20) 20 MEQ tablet Take 1 tablet (20 mEq total) by mouth daily. 180 tablet 0   Probiotic Product (PROBIOTIC DAILY PO) Take 1 tablet by mouth daily.     senna-docusate (SENNA S) 8.6-50 MG tablet Take 2 tablets by mouth daily. 60 tablet 3   loperamide (IMODIUM) 2 MG capsule Take 1 capsule (2 mg total) by mouth See admin instructions. Take 2 tablets after first loose stool,  then 1 tablet  after each loose stool; maximum: 8 tablets /day (Patient not taking: Reported on 02/27/2023) 60 capsule 0   ondansetron (ZOFRAN) 8 MG tablet Take 1 tablet (8 mg total) by mouth 2 (two) times daily as needed for refractory nausea / vomiting. Start on day 3 after chemotherapy. (Patient not taking: Reported on 02/27/2023) 60 tablet 1   prochlorperazine (COMPAZINE) 10 MG tablet Take 1 tablet (10 mg total) by mouth every 6 (six) hours as needed (NAUSEA). (Patient not taking: Reported on 02/27/2023) 30 tablet 1   No current facility-administered medications for this visit.   Facility-Administered Medications Ordered in Other Visits  Medication Dose Route  Frequency Provider Last Rate Last Admin   0.9 %  sodium chloride infusion   Intravenous Once Corcoran, Melissa C, MD       0.9 %  sodium chloride infusion   Intravenous Continuous Rosey Bath, MD 10 mL/hr at 12/31/19 1000 New  Bag at 10/05/20 1045   heparin lock flush 100 unit/mL  500 Units Intravenous Once Corcoran, Melissa C, MD       sodium chloride flush (NS) 0.9 % injection 10 mL  10 mL Intracatheter PRN [name], MD   10 mL at 02/01/23 1341    Performance status (ECOG): 1  Vitals Blood pressure 138/79, pulse 62, temperature (!) 96.8 F (36 C), resp. rate 18, weight 209 lb 14.4 oz (95.2 kg).   Physical Exam Constitutional:      General: He is not in acute distress.    Appearance: He is obese. He is not diaphoretic.  HENT:     Head: Normocephalic and atraumatic.  Eyes:     General: No scleral icterus. Cardiovascular:     Rate and Rhythm: Normal rate and regular rhythm.     Heart sounds: No murmur heard. Pulmonary:     Effort: Pulmonary effort is normal. No respiratory distress.     Breath sounds: No wheezing.     Comments: Decreased breath sound bilaterally.  Abdominal:     General: Bowel sounds are normal. There is no distension.     Palpations: Abdomen is soft.  Musculoskeletal:        General: Normal range of motion.     Cervical back: Normal range of motion and neck supple.     Comments: Trace edema bilaterally  Skin:    General: Skin is warm and dry.     Findings: No erythema.  Neurological:     Mental Status: He is alert and oriented to person, place, and time. Mental status is at baseline.     Motor: No abnormal muscle tone.  Psychiatric:        Mood and Affect: Mood and affect normal.     Labs were reviewed by me.     Latest Ref Rng & Units 02/27/2023   10:47 AM 02/13/2023   10:11 AM 01/30/2023   10:10 AM  CBC  WBC 4.0 - 10.5 K/uL 4.9  4.9  4.7   Hemoglobin 13.0 - 17.0 g/dL 16.1  09.6  04.5   Hematocrit 39.0 - 52.0 % 34.2  34.8  34.0   Platelets  150 - 400 K/uL 179  184  162       Latest Ref Rng & Units 02/27/2023   10:47 AM 02/13/2023   10:11 AM 01/30/2023   10:10 AM  CMP  Glucose 70 - 99 mg/dL 409  811  94   BUN 8 - 23 mg/dL 17  12  8    Creatinine 0.61 - 1.24 mg/dL 9.14  7.82  9.56   Sodium 135 - 145 mmol/L 136  133  136   Potassium 3.5 - 5.1 mmol/L 3.8  3.9  3.4   Chloride 98 - 111 mmol/L 106  100  106   CO2 22 - 32 mmol/L 24  24  25    Calcium 8.9 - 10.3 mg/dL 9.1  9.0  8.7   Total Protein 6.5 - 8.1 g/dL 6.5  6.3  5.7   Total Bilirubin 0.3 - 1.2 mg/dL 0.6  0.8  1.1   Alkaline Phos 38 - 126 U/L 37  39  37   AST 15 - 41 U/L 25  28  33   ALT 0 - 44 U/L 11  15  17

## 2023-02-27 NOTE — Assessment & Plan Note (Signed)
Chemotherapy plan as listed above 

## 2023-02-27 NOTE — Assessment & Plan Note (Signed)
Echo showed normal LVEF.  Grade 1 diastolic dysfunction.

## 2023-02-27 NOTE — Progress Notes (Signed)
Pt here for follow up. Pt has had 6 pound weight loss since last visit.

## 2023-02-27 NOTE — Progress Notes (Signed)
Per Dr Cathie Hoops, pt will only get 5-Fluorouracil pump today.  Ebony Hail, Pharm.D., CPP 02/27/2023@11 :47 AM

## 2023-02-27 NOTE — Patient Instructions (Addendum)
[facility] [facility]  Discharge Instructions: Thank you for choosing [facility] to provide your oncology and hematology care.  If you have a lab appointment with the Cancer Center, please go directly to the Cancer Center and check in at the registration area.  Wear comfortable clothing and clothing appropriate for easy access to any Portacath or PICC line.   We strive to give you quality time with your provider. You may need to reschedule your appointment if you arrive late (15 or more minutes).  Arriving late affects you and other patients whose appointments are after yours.  Also, if you miss three or more appointments without notifying the office, you may be dismissed from the clinic at the provider's discretion.      For prescription refill requests, have your pharmacy contact our office and allow 72 hours for refills to be completed.    Today you received the following chemotherapy and/or immunotherapy agents  5 FU      To help prevent nausea and vomiting after your treatment, we encourage you to take your nausea medication as directed.  BELOW ARE SYMPTOMS THAT SHOULD BE REPORTED IMMEDIATELY: *FEVER GREATER THAN 100.4 F (38 C) OR HIGHER *CHILLS OR SWEATING *NAUSEA AND VOMITING THAT IS NOT CONTROLLED WITH YOUR NAUSEA MEDICATION *UNUSUAL SHORTNESS OF BREATH *UNUSUAL BRUISING OR BLEEDING *URINARY PROBLEMS (pain or burning when urinating, or frequent urination) *BOWEL PROBLEMS (unusual diarrhea, constipation, pain near the anus) TENDERNESS IN MOUTH AND THROAT WITH OR WITHOUT PRESENCE OF ULCERS (sore throat, sores in mouth, or a toothache) UNUSUAL RASH, SWELLING OR PAIN  UNUSUAL VAGINAL DISCHARGE OR ITCHING   Items with * indicate a potential emergency and should be followed up as soon as possible or go to the Emergency Department if any problems should occur.  Please show the CHEMOTHERAPY ALERT CARD or IMMUNOTHERAPY ALERT CARD at check-in to the  Emergency Department and triage nurse.  Should you have questions after your visit or need to cancel or reschedule your appointment, please contact [facility] [facility]  [phone] and follow the prompts.  Office hours are 8:00 a.m. to 4:30 p.m. Monday - Friday. Please note that voicemails left after 4:00 p.m. may not be returned until the following business day.  We are closed weekends and major holidays. You have access to a nurse at all times for urgent questions. Please call the main number to the clinic [phone] and follow the prompts.  For any non-urgent questions, you may also contact your provider using MyChart. We now offer e-Visits for anyone 64 and older to request care online for non-urgent symptoms. For details visit [email or web address].   Also download the MyChart app! Go to the app store, search "MyChart", open the app, select [place], and log in with your MyChart username and password.    Fluorouracil Injection What is this medication? FLUOROURACIL (flure oh YOOR a sil) treats some types of cancer. It works by slowing down the growth of cancer cells. This medicine may be used for other purposes; ask your health care provider or pharmacist if you have questions. COMMON BRAND NAME(S): Adrucil What should I tell my care team before I take this medication? They need to know if you have any of these conditions: Blood disorders Dihydropyrimidine dehydrogenase (DPD) deficiency Infection, such as chickenpox, cold sores, herpes Kidney disease Liver disease Poor nutrition Recent or ongoing radiation therapy An unusual or allergic reaction to fluorouracil, other medications, foods, dyes, or preservatives  If you or your partner are pregnant or trying to get pregnant Breast-feeding How should I use this medication? This medication is injected into a vein. It is administered by your care team in a hospital or clinic setting. Talk to your care team  about the use of this medication in children. Special care may be needed. Overdosage: If you think you have taken too much of this medicine contact a poison control center or emergency room at once. NOTE: This medicine is only for you. Do not share this medicine with others. What if I miss a dose? Keep appointments for follow-up doses. It is important not to miss your dose. Call your care team if you are unable to keep an appointment. What may interact with this medication? Do not take this medication with any of the following: Live virus vaccines This medication may also interact with the following: Medications that treat or prevent blood clots, such as warfarin, enoxaparin, dalteparin This list may not describe all possible interactions. Give your health care provider a list of all the medicines, herbs, non-prescription drugs, or dietary supplements you use. Also tell them if you smoke, drink alcohol, or use illegal drugs. Some items may interact with your medicine. What should I watch for while using this medication? Your condition will be monitored carefully while you are receiving this medication. This medication may make you feel generally unwell. This is not uncommon as chemotherapy can affect healthy cells as well as cancer cells. Report any side effects. Continue your course of treatment even though you feel ill unless your care team tells you to stop. In some cases, you may be given additional medications to help with side effects. Follow all directions for their use. This medication may increase your risk of getting an infection. Call your care team for advice if you get a fever, chills, sore throat, or other symptoms of a cold or flu. Do not treat yourself. Try to avoid being around people who are sick. This medication may increase your risk to bruise or bleed. Call your care team if you notice any unusual bleeding. Be careful brushing or flossing your teeth or using a toothpick because  you may get an infection or bleed more easily. If you have any dental work done, tell your dentist you are receiving this medication. Avoid taking medications that contain aspirin, acetaminophen, ibuprofen, naproxen, or ketoprofen unless instructed by your care team. These medications may hide a fever. Do not treat diarrhea with over the counter products. Contact your care team if you have diarrhea that lasts more than 2 days or if it is severe and watery. This medication can make you more sensitive to the sun. Keep out of the sun. If you cannot avoid being in the sun, wear protective clothing and sunscreen. Do not use sun lamps, tanning beds, or tanning booths. Talk to your care team if you or your partner wish to become pregnant or think you might be pregnant. This medication can cause serious birth defects if taken during pregnancy and for 3 months after the last dose. A reliable form of contraception is recommended while taking this medication and for 3 months after the last dose. Talk to your care team about effective forms of contraception. Do not father a child while taking this medication and for 3 months after the last dose. Use a condom while having sex during this time period. Do not breastfeed while taking this medication. This medication may cause infertility. Talk to your care  team if you are concerned about your fertility. What side effects may I notice from receiving this medication? Side effects that you should report to your care team as soon as possible: Allergic reactions--skin rash, itching, hives, swelling of the face, lips, tongue, or throat Heart attack--pain or tightness in the chest, shoulders, arms, or jaw, nausea, shortness of breath, cold or clammy skin, feeling faint or lightheaded Heart failure--shortness of breath, swelling of the ankles, feet, or hands, sudden weight gain, unusual weakness or fatigue Heart rhythm changes--fast or irregular heartbeat, dizziness, feeling  faint or lightheaded, chest pain, trouble breathing High ammonia level--unusual weakness or fatigue, confusion, loss of appetite, nausea, vomiting, seizures Infection--fever, chills, cough, sore throat, wounds that don't heal, pain or trouble when passing urine, general feeling of discomfort or being unwell Low red blood cell level--unusual weakness or fatigue, dizziness, headache, trouble breathing Pain, tingling, or numbness in the hands or feet, muscle weakness, change in vision, confusion or trouble speaking, loss of balance or coordination, trouble walking, seizures Redness, swelling, and blistering of the skin over hands and feet Severe or prolonged diarrhea Unusual bruising or bleeding Side effects that usually do not require medical attention (report to your care team if they continue or are bothersome): Dry skin Headache Increased tears Nausea Pain, redness, or swelling with sores inside the mouth or throat Sensitivity to light Vomiting This list may not describe all possible side effects. Call your doctor for medical advice about side effects. You may report side effects to FDA at 1-800-FDA-1088. Where should I keep my medication? This medication is given in a hospital or clinic. It will not be stored at home. NOTE: This sheet is a summary. It may not cover all possible information. If you have questions about this medicine, talk to your doctor, pharmacist, or health care provider.  2024 Elsevier/Gold Standard (2021-09-06 00:00:00)

## 2023-02-27 NOTE — Assessment & Plan Note (Addendum)
Overall he tolerates chemotherapy.   CEA is relatively stable with small fluctuations. CT images shows slight lung progression - Labs reviewed and discussed with patient Proceed with 5-FU today, he is on palliative radiation to masseteric soft tissue nodule.  Last radiation 03/05/2023. After that he will have a chemo break.

## 2023-02-27 NOTE — Assessment & Plan Note (Signed)
Encourage protein rich diet. Follow up with nutritionist  Recommend Megace 80 mg twice daily as appetite stimulant.

## 2023-02-27 NOTE — Assessment & Plan Note (Signed)
decreased due to chemotherapy. continue to monitor.

## 2023-02-27 NOTE — Progress Notes (Signed)
Nutrition Follow-up:  Patient with metastatic colon cancer.  S/p transverse colectomy (2015).  Patient receiving 5 FU only today. Receiving radiation.   Met with patient briefly as his treatment was ending early today.  Reports that his appetite is about the same.  Taste alterations continue.  Says he is drinking shakes    Medications: megace added today  Labs: reviewed  Anthropometrics:   Weight 209 lb 14.4 oz today  215 lb 6.6 oz on 10/1 238 lb on 3/5 243 lb on 03/14/22   NUTRITION DIAGNOSIS: Unintentional weight loss continues    INTERVENTION:  Patient will start megace Continue 350 calorie shake for added calories Encouraged mid-day snack.      MONITORING, EVALUATION, GOAL: weight trends, intake   NEXT VISIT: Tuesday, Nov 12 in clinic  Griff Badley B. Freida Busman, RD, LDN Registered Dietitian [phone]

## 2023-02-27 NOTE — Assessment & Plan Note (Signed)
Recommend patient to stop laxatives and stool softener.  Recommend imodium PRN

## 2023-02-27 NOTE — Assessment & Plan Note (Signed)
Slow growing small lung nodule, discussed with Dr. Rushie Chestnut.  Will hold off radiation and continue close monitor.

## 2023-02-28 ENCOUNTER — Ambulatory Visit
Admission: RE | Admit: 2023-02-28 | Discharge: 2023-02-28 | Disposition: A | Discharge status: Home / Self Care | Payer: Medicare Other | Source: Ambulatory Visit | Attending: Radiation Oncology | Admitting: Radiation Oncology

## 2023-02-28 ENCOUNTER — Other Ambulatory Visit: Payer: Self-pay

## 2023-02-28 DIAGNOSIS — Z510 Encounter for antineoplastic radiation therapy: Secondary | ICD-10-CM | POA: Diagnosis not present

## 2023-02-28 LAB — RAD ONC ARIA SESSION SUMMARY
Course Elapsed Days: 29
Plan Fractions Treated to Date: 22
Plan Prescribed Dose Per Fraction: 1.8 Gy
Plan Total Fractions Prescribed: 25
Plan Total Prescribed Dose: 45 Gy
Reference Point Dosage Given to Date: 39.6 Gy
Reference Point Session Dosage Given: 1.8 Gy
Session Number: 22

## 2023-03-01 ENCOUNTER — Other Ambulatory Visit: Payer: Self-pay

## 2023-03-01 ENCOUNTER — Ambulatory Visit
Admission: RE | Admit: 2023-03-01 | Discharge: 2023-03-01 | Disposition: A | Discharge status: Home / Self Care | Payer: Medicare Other | Source: Ambulatory Visit | Attending: Radiation Oncology | Admitting: Radiation Oncology

## 2023-03-01 ENCOUNTER — Inpatient Hospital Stay: Payer: Medicare Other

## 2023-03-01 VITALS — BP 127/72 | HR 68 | Resp 18

## 2023-03-01 DIAGNOSIS — Z510 Encounter for antineoplastic radiation therapy: Secondary | ICD-10-CM | POA: Diagnosis not present

## 2023-03-01 DIAGNOSIS — Z95828 Presence of other vascular implants and grafts: Secondary | ICD-10-CM

## 2023-03-01 DIAGNOSIS — C786 Secondary malignant neoplasm of retroperitoneum and peritoneum: Secondary | ICD-10-CM

## 2023-03-01 DIAGNOSIS — Z85038 Personal history of other malignant neoplasm of large intestine: Secondary | ICD-10-CM

## 2023-03-01 LAB — RAD ONC ARIA SESSION SUMMARY
Course Elapsed Days: 30
Plan Fractions Treated to Date: 23
Plan Prescribed Dose Per Fraction: 1.8 Gy
Plan Total Fractions Prescribed: 25
Plan Total Prescribed Dose: 45 Gy
Reference Point Dosage Given to Date: 41.4 Gy
Reference Point Session Dosage Given: 1.8 Gy
Session Number: 23

## 2023-03-01 MED ORDER — SODIUM CHLORIDE 0.9% FLUSH
10.0000 mL | Freq: Once | INTRAVENOUS | Status: AC
  Administered 2023-03-01: 10 mL via INTRAVENOUS
  Filled 2023-03-01: qty 10

## 2023-03-01 MED ORDER — HEPARIN SOD (PORK) LOCK FLUSH 100 UNIT/ML IV SOLN
500.0000 [IU] | Freq: Once | INTRAVENOUS | Status: AC
  Administered 2023-03-01: 500 [IU] via INTRAVENOUS
  Filled 2023-03-01: qty 5

## 2023-03-02 ENCOUNTER — Other Ambulatory Visit: Payer: Self-pay

## 2023-03-02 ENCOUNTER — Ambulatory Visit
Admission: RE | Admit: 2023-03-02 | Discharge: 2023-03-02 | Disposition: A | Discharge status: Home / Self Care | Payer: Medicare Other | Source: Ambulatory Visit | Attending: Radiation Oncology | Admitting: Radiation Oncology

## 2023-03-02 DIAGNOSIS — Z510 Encounter for antineoplastic radiation therapy: Secondary | ICD-10-CM | POA: Diagnosis not present

## 2023-03-02 LAB — RAD ONC ARIA SESSION SUMMARY
Course Elapsed Days: 31
Plan Fractions Treated to Date: 24
Plan Prescribed Dose Per Fraction: 1.8 Gy
Plan Total Fractions Prescribed: 25
Plan Total Prescribed Dose: 45 Gy
Reference Point Dosage Given to Date: 43.2 Gy
Reference Point Session Dosage Given: 1.8 Gy
Session Number: 24

## 2023-03-05 ENCOUNTER — Ambulatory Visit
Admission: RE | Admit: 2023-03-05 | Discharge: 2023-03-05 | Disposition: A | Discharge status: Home / Self Care | Payer: Medicare Other | Source: Ambulatory Visit | Attending: Radiation Oncology | Admitting: Radiation Oncology

## 2023-03-05 ENCOUNTER — Other Ambulatory Visit: Payer: Self-pay

## 2023-03-05 ENCOUNTER — Ambulatory Visit: Payer: Medicare Other

## 2023-03-05 DIAGNOSIS — Z510 Encounter for antineoplastic radiation therapy: Secondary | ICD-10-CM | POA: Diagnosis not present

## 2023-03-05 LAB — RAD ONC ARIA SESSION SUMMARY
Course Elapsed Days: 34
Plan Fractions Treated to Date: 25
Plan Prescribed Dose Per Fraction: 1.8 Gy
Plan Total Fractions Prescribed: 25
Plan Total Prescribed Dose: 45 Gy
Reference Point Dosage Given to Date: 45 Gy
Reference Point Session Dosage Given: 1.8 Gy
Session Number: 25

## 2023-03-06 ENCOUNTER — Ambulatory Visit: Payer: Medicare Other

## 2023-03-23 ENCOUNTER — Other Ambulatory Visit: Payer: Self-pay | Admitting: Oncology

## 2023-03-26 ENCOUNTER — Encounter: Payer: Self-pay | Admitting: Hematology and Oncology

## 2023-03-26 ENCOUNTER — Encounter: Payer: Self-pay | Admitting: Oncology

## 2023-03-27 ENCOUNTER — Inpatient Hospital Stay (HOSPITAL_BASED_OUTPATIENT_CLINIC_OR_DEPARTMENT_OTHER): Payer: Medicare Other | Admitting: Oncology

## 2023-03-27 ENCOUNTER — Inpatient Hospital Stay: Payer: Medicare Other | Attending: Oncology

## 2023-03-27 ENCOUNTER — Encounter: Payer: Self-pay | Admitting: Oncology

## 2023-03-27 ENCOUNTER — Inpatient Hospital Stay: Payer: Medicare Other

## 2023-03-27 VITALS — BP 103/62 | HR 66 | Temp 98.5°F | Wt 206.6 lb

## 2023-03-27 DIAGNOSIS — K5900 Constipation, unspecified: Secondary | ICD-10-CM | POA: Insufficient documentation

## 2023-03-27 DIAGNOSIS — Z79899 Other long term (current) drug therapy: Secondary | ICD-10-CM | POA: Diagnosis not present

## 2023-03-27 DIAGNOSIS — Z452 Encounter for adjustment and management of vascular access device: Secondary | ICD-10-CM | POA: Diagnosis not present

## 2023-03-27 DIAGNOSIS — R634 Abnormal weight loss: Secondary | ICD-10-CM | POA: Insufficient documentation

## 2023-03-27 DIAGNOSIS — C184 Malignant neoplasm of transverse colon: Secondary | ICD-10-CM | POA: Diagnosis present

## 2023-03-27 DIAGNOSIS — K5909 Other constipation: Secondary | ICD-10-CM

## 2023-03-27 DIAGNOSIS — T451X5A Adverse effect of antineoplastic and immunosuppressive drugs, initial encounter: Secondary | ICD-10-CM

## 2023-03-27 DIAGNOSIS — T451X5D Adverse effect of antineoplastic and immunosuppressive drugs, subsequent encounter: Secondary | ICD-10-CM | POA: Diagnosis not present

## 2023-03-27 DIAGNOSIS — R918 Other nonspecific abnormal finding of lung field: Secondary | ICD-10-CM

## 2023-03-27 DIAGNOSIS — Z803 Family history of malignant neoplasm of breast: Secondary | ICD-10-CM | POA: Diagnosis not present

## 2023-03-27 DIAGNOSIS — Z95828 Presence of other vascular implants and grafts: Secondary | ICD-10-CM

## 2023-03-27 DIAGNOSIS — D6481 Anemia due to antineoplastic chemotherapy: Secondary | ICD-10-CM | POA: Diagnosis not present

## 2023-03-27 DIAGNOSIS — C786 Secondary malignant neoplasm of retroperitoneum and peritoneum: Secondary | ICD-10-CM

## 2023-03-27 DIAGNOSIS — Z85038 Personal history of other malignant neoplasm of large intestine: Secondary | ICD-10-CM

## 2023-03-27 DIAGNOSIS — Z860100 Personal history of colon polyps, unspecified: Secondary | ICD-10-CM | POA: Insufficient documentation

## 2023-03-27 DIAGNOSIS — Z5111 Encounter for antineoplastic chemotherapy: Secondary | ICD-10-CM

## 2023-03-27 DIAGNOSIS — Z836 Family history of other diseases of the respiratory system: Secondary | ICD-10-CM | POA: Insufficient documentation

## 2023-03-27 DIAGNOSIS — Z809 Family history of malignant neoplasm, unspecified: Secondary | ICD-10-CM | POA: Diagnosis not present

## 2023-03-27 DIAGNOSIS — Z7982 Long term (current) use of aspirin: Secondary | ICD-10-CM | POA: Diagnosis not present

## 2023-03-27 DIAGNOSIS — K648 Other hemorrhoids: Secondary | ICD-10-CM | POA: Insufficient documentation

## 2023-03-27 LAB — CBC WITH DIFFERENTIAL (CANCER CENTER ONLY)
Abs Immature Granulocytes: 0.03 10*3/uL (ref 0.00–0.07)
Basophils Absolute: 0.1 10*3/uL (ref 0.0–0.1)
Basophils Relative: 1 %
Eosinophils Absolute: 0.2 10*3/uL (ref 0.0–0.5)
Eosinophils Relative: 3 %
HCT: 31.2 % — ABNORMAL LOW (ref 39.0–52.0)
Hemoglobin: 10.2 g/dL — ABNORMAL LOW (ref 13.0–17.0)
Immature Granulocytes: 0 %
Lymphocytes Relative: 10 %
Lymphs Abs: 0.7 10*3/uL (ref 0.7–4.0)
MCH: 30.5 pg (ref 26.0–34.0)
MCHC: 32.7 g/dL (ref 30.0–36.0)
MCV: 93.4 fL (ref 80.0–100.0)
Monocytes Absolute: 0.6 10*3/uL (ref 0.1–1.0)
Monocytes Relative: 8 %
Neutro Abs: 5.2 10*3/uL (ref 1.7–7.7)
Neutrophils Relative %: 78 %
Platelet Count: 194 10*3/uL (ref 150–400)
RBC: 3.34 MIL/uL — ABNORMAL LOW (ref 4.22–5.81)
RDW: 16 % — ABNORMAL HIGH (ref 11.5–15.5)
WBC Count: 6.7 10*3/uL (ref 4.0–10.5)
nRBC: 0 % (ref 0.0–0.2)

## 2023-03-27 LAB — CMP (CANCER CENTER ONLY)
ALT: 16 U/L (ref 0–44)
AST: 28 U/L (ref 15–41)
Albumin: 3.2 g/dL — ABNORMAL LOW (ref 3.5–5.0)
Alkaline Phosphatase: 40 U/L (ref 38–126)
Anion gap: 6 (ref 5–15)
BUN: 15 mg/dL (ref 8–23)
CO2: 21 mmol/L — ABNORMAL LOW (ref 22–32)
Calcium: 8.8 mg/dL — ABNORMAL LOW (ref 8.9–10.3)
Chloride: 107 mmol/L (ref 98–111)
Creatinine: 0.72 mg/dL (ref 0.61–1.24)
GFR, Estimated: >60 mL/min (ref >60)
Glucose, Bld: 135 mg/dL — ABNORMAL HIGH (ref 70–99)
Potassium: 3.8 mmol/L (ref 3.5–5.1)
Sodium: 134 mmol/L — ABNORMAL LOW (ref 135–145)
Total Bilirubin: 1.1 mg/dL (ref <1.2)
Total Protein: 6 g/dL — ABNORMAL LOW (ref 6.5–8.1)

## 2023-03-27 MED ORDER — SODIUM CHLORIDE 0.9% FLUSH
10.0000 mL | Freq: Once | INTRAVENOUS | Status: AC
  Administered 2023-03-27: 10 mL via INTRAVENOUS
  Filled 2023-03-27: qty 10

## 2023-03-27 MED ORDER — HEPARIN SOD (PORK) LOCK FLUSH 100 UNIT/ML IV SOLN
500.0000 [IU] | Freq: Once | INTRAVENOUS | Status: AC
  Administered 2023-03-27: 500 [IU] via INTRAVENOUS
  Filled 2023-03-27: qty 5

## 2023-03-27 MED ORDER — DOCUSATE SODIUM 100 MG PO CAPS: 100.0000 mg | ORAL_CAPSULE | Freq: Two times a day (BID) | ORAL | 3 refills | Status: DC | PRN

## 2023-03-27 NOTE — Progress Notes (Signed)
Hematology/Oncology Progress note [phone] [phone]    Chief Complaint: Jared Gutierrez is a 82 y.o.  male presents for follow-up of metastatic colon cancer   ASSESSMENT & PLAN:   Cancer Staging  Cancer of transverse colon [facility]) Staging form: Colon and Rectum, [name], [name], [name], MD on 10/26/2021   Cancer of transverse colon (HCC) Overall he tolerates chemotherapy.   CEA is relatively stable with small fluctuations. CT images shows slight lung progression - Labs reviewed and discussed with patient Finished palliative radiation on 03/05/2023. Repeat CT chest abdomen pelvis in December.  Consider resuming maintenance treatment in Jan 2025    Encounter for antineoplastic chemotherapy Chemotherapy plan as listed above.   Anemia due to chemotherapy decreased due to chemotherapy. continue to monitor.  Weight loss Encourage protein rich diet. Follow up with nutritionist  Recommend Megace 80 mg twice daily as appetite stimulant.  Lung nodules Slow growing small lung nodule, discussed with Dr. Rushie Chestnut.  Will hold off radiation and continue close monitor.  Constipation He has been on Colace 100mg  1-2 times daily. And Miralax daily.    Follow-up 2 months All questions were answered. The patient knows to call the clinic with any problems, questions or concerns.  [name], MD, PhD [facility] Hematology Oncology 03/27/2023     PERTINENT ONCOLOGY HISTORY Jared Gutierrez is a 82 y.o.[place] who has above oncology history reviewed by me today presented for follow up visit for management of Metastatic colon cancer. [name], patient switched care to me on 11/11/20 Extensive medical record review was performed by me  Oncology History  History of colon cancer, stage I  10/02/2014 Pathology Results   [id number] colonoscopy pathology A. CECAL POLYPS;  HOT SNARE AND COLD BIOPSY:  - TUBULAR ADENOMA, MULTIPLE FRAGMENTS.  - NEGATIVE FOR HIGH-GRADE DYSPLASIA AND MALIGNANCY.   B. COLON POLYP, TRANSVERSE; COLD BIOPSY:  - TUBULAR ADENOMA.  - NEGATIVE FOR HIGH-GRADE DYSPLASIA AND MALIGNANCY.     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  Tumor extended into the submucosa. Margins were negative. + lymphovascular invasion. 14 lymph nodes were negative. Pathologic stage was T1 N0.  TRANSVERSE COLON, RESECTION:  - INVASIVE ADENOCARCINOMA ARISING IN A 4.6 CM TUBULOVILLOUS  ADENOMA WITH HIGH GRADE DYSPLASIA (MALIGNANT POLYP).  - SEE SUMMARY BELOW.  [name]  ONCOLOGY SUMMARY: COLON AND RECTUM, RESECTION, AJCC 7TH EDITION  Specimen: transverse colon  Procedure: transverse colon resection  Tumor site: splenic flexure  Tumor size: 1.0 cm (invasive component)  Macroscopic tumor perforation: not specified  Histologic type: invasive adenocarcinoma  Histologic grade: moderately differentiated  Microscopic tumor extension: into  submucosa  Margins:       Proximal margin: negative       Distal margin: negative       Circumferential (radial) or mesenteric margin: negative       If all margins  uninvolved by invasive carcinoma:       Distance of invasive carcinoma from closest margin: 7.5 cm  to distal margin  Treatment effect: not applicable  Lymph-vascular invasion: present  Perineural invasion: not identified  Tumor deposits (discontinuous extramural extension): not  identified  Pathologic staging:       Primary tumor: pT1       Regional lymph nodes: pN0            Number of nodes examined: 14            Number of nodes involved: 0    04/02/2019 Progression   04/02/2019  PET scan  limited evealed a 2.4 x 2.3 cm (SUV 11) hypermetabolic soft tissue density caudal and anterior to the pancreatic neck favored to represent isolated peritoneal or nodal metastasis in the setting of prior transverse colonic resection (expected primary drainage). Although this was immediately adjacent to the pancreas, a fat plane was maintained, arguing strongly against a pancreatic primary. Otherwise, there was no evidence of hypermetabolic metastasis. 04/17/2019 EUS on  revealed a normal esophagus, stomach, duodenum, and pancreas.  There was a 2.4 x 2.4 cm irregular mass in the retroperitoneum adjacent to, but not involving the pancreatic neck.  FNA and core needle biopsy were performed.  Pathology revealed adenocarcinoma compatible with a metastatic lesion of colorectal origin.  Tumor cells were positive for CK20 and CDX2 and negative for CK7.  NGS: [employer] on 05/15/2019 revealed + [id number] and TP53.  Negative results included BRAF V600E, Her2, NRAS, NTRK, PD-L1 (<1%), and TMB 8.7/Mb (intermediate).  MMR testing from his colon resection on 10/16/2013 was intact with a low probability of MSI-H.   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  He received Neulasta after cycle #4 and #5 secondary to progressive leukopenia.  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  He received a truncated course of FOLFOX with cycle #11 secondary to oxaliplatin reaction.   01/14/2020 Imaging    PET revealed an interval decrease in size and FDG uptake (2.4 x 2.3 cm with SUV 11 to 2.4 x 1.7 cm with SUV 4.09) associated with the previously referenced soft tissue density caudal and anterior to the pancreatic neck suggesting treatment response. There were no new sites of FDG avid tumor.   08/31/2020 Imaging   08/31/2020 Abdomen and pelvis CT revealed increased size of the index soft tissue lesion inferior and anterior to the pancreatic neck (2.9 x 1.8 cm to 3.5 x 2.9 cm). There were similar prominent retroperitoneal lymph nodes without adenopathy by size criteria. There was no new or enlarging abdominal or pelvic lymph nodes. There were no new interval findings. There was similar circumferential wall thickening of a nondistended urinary bladder, which likely accentuated wall thickening There was hepatic steatosis and aortic atherosclerosis.   11/03/2020, PET showed recurrent peritoneal metastasis in the upper abdomen adjacent to the pancreas.  The lesion is 3.8 x 2.9 cm with SUV of 11.3. No evidence of metastatic peritoneal disease elsewhere in the abdomen pelvis.  No evidence of distant metastasis.   11/03/2020 Imaging   PET  scan showed recurrent peritoneal metastasis near pancreas. Given that he has no other distant metastasis. Discussed with radiation oncology. Repeat SBRT may be considered if he does not respond well to systemic chemotherapy.     11/26/2020 -  Chemotherapy   5-FU and bevacizumab.   02/17/2021 Imaging   02/17/2021 CT abdomen pelvis showed partial response. Mild decrease of peritoneal lesion.  Proceed with 5-FU and bevacizumab today.  Given that he is tolerating current regimen with good life quality, partial response.  Shared decision was made to hold off adding additional chemotherapy agents.   06/08/2021, CT chest abdomen pelvis with contrast  showed soft tissue mass at the base of mesentery inferior to the pancreatic neck is unchanged in size.  No progressive disease was identified.   09/07/2021, CT chest abdomen pelvis with contrast showed no substantial changes in size of the mesenteric mass, 2.6 cm.  No suspicious new lesions.  Chronic findings as detailed in the imaging report.   12/05/2021 Imaging   PET scan showed soft tissue mesenteric mass is unchanged in size.  Decreased FDG uptake compared to activity on November 03, 2020.  Fever treated disease.  No new metastatic disease.   03/21/2022 Imaging   CT chest abdomen pelvis  1. Similar size of the peripancreatic presumed peritoneal implant compared to 09/07/2021. 2. No new or progressive disease. 3. Incidental findings, including: Coronary artery atherosclerosis.Aortic Atherosclerosis (ICD10-I70.0). Prostatomegaly with chronic bladder wall thickening, suggesting outlet obstruction.     07/11/2022 Imaging   CT chest abdomen pelvis with contrast showed 1. New 6 mm nodule in the right upper lobe and 2 mm nodule in the left upper lobe. Although small these are concerning. Recommend short follow-up in 3 months 2. Stable low-attenuation lesion along the midbody of the pancreas. Continued attention on follow-up. No new peritoneal mass lesions or ascites. 3. Fatty liver infiltration 4. Colonic surgical changes   10/03/2022 Imaging   CT chest abdomen pelvis w contrast showed 1. Slight interval enlargement and cavitation of a nodule in the right upper lobe, measuring 0.6 cm, previously 0.5 cm. Slight interval enlargement of a left upper lobe nodule measuring 0.3 cm,previously 0.2 cm. Findings are concerning for slowly enlarging small pulmonary metastases. 2. No significant change in a lobulated soft tissue mass within the central small bowel mesentery abutting the inferior aspect of the pancreas, consistent with a stable, treated metastasis. 3. No other evidence of lymphadenopathy or  metastatic disease in the chest, abdomen, or pelvis. 4. Prostatomegaly with diffuse wall thickening of the relatively decompressed urinary bladder, likely secondary to chronic outlet obstruction. 5. Unchanged fibrotic scarring and bronchiectasis of the bilateral lung bases. This could be further assessed by pulmonary referral and dedicated interstitial lung disease protocol CT examination of the chest if and when clinically appropriate in the setting of known metastatic malignancy. Aortic Atherosclerosis    12/27/2022 Imaging   CT chest abdomen pelvis w contrast showed 1. Continued slight interval enlargement of a cavitary nodule in the right upper lobe measuring 0.7 x 0.6 cm, as well as a nodule in the peripheral left upper lobe measuring 0.5 cm. These are highly suspicious for slowly enlarging pulmonary metastases but remain below size threshold for reliable PET-CT characterization. 2. Unchanged, lobulated hypodense soft tissue mass in the central small bowel mesentery, consistent with treated metastatic disease. 3. Status post transverse colon resection and reanastomosis. 4. Thickening of the distal sigmoid colon and rectum, consistent with nonspecific infectious or inflammatory colitis. 5. Prostatomegaly with diffuse thickening of  the urinary bladder wall, likely secondary to chronic outlet obstruction.   Aortic Atherosclerosis (ICD10-I70.0).   01/09/2023 Imaging   PET scan showed Status post transverse colectomy. 4.2 cm soft tissue mass in the central abdominal mesentery, suspicious for residual/recurrent nodal metastasis. Additional 8 mm short axis left para-aortic nodal metastasis. 7 mm cavitary right upper lobe nodule and 5 mm left upper lobe nodule, both beneath the size threshold for PET sensitivity, but suspicious for small pulmonary metastases.   01/30/2023 -  Radiation Therapy   Palliative radiation to mesenteric soft tissue mass.     Other medical problems Chronic lower  extremity edema. 12/24/2018, right lower extremity duplex negative for DVT.  Small right Baker's cyst. 08/16/2019, bilateral lower extremity duplex showed no DVT. 08/16/2019 - 08/17/2019 with right lower extremity cellulitis.  He was unable to bear weight.  He was treated with IV fluids, NSAIDs, colchicine, and broad antibiotics (vancomycin and Cefepime).  He was discharged on indomethacin x 5 days and Keflex 500 mg TID x 5 days.  10/05/2020, colonoscopy showed internal hemorrhoids.  Otherwise normal examination.  INTERVAL HISTORY Jared Gutierrez is a 82 y.o. male who has above history reviewed by me today presents for follow up visit for management of recurrent metastatic colon cancer Patient was accompanied by wife Denies fever, chills, nausea, vomiting, diarrhea, chest pain, shortness of breath, abdominal pain,  + constipation Appetite is fair.    Review of Systems  Constitutional:  Negative for appetite change, chills, diaphoresis, fever and unexpected weight change.  HENT:   Negative for hearing loss, nosebleeds, sore throat and tinnitus.   Respiratory:  Negative for cough, hemoptysis and shortness of breath.   Cardiovascular:  Negative for chest pain and palpitations.  Gastrointestinal:  Negative for abdominal pain, blood in stool, constipation, diarrhea, nausea and vomiting.  Genitourinary:  Negative for dysuria, frequency and hematuria.   Musculoskeletal:  Positive for arthralgias. Negative for back pain, myalgias and neck pain.  Skin:  Negative for itching and rash.  Neurological:  Positive for numbness. Negative for dizziness and headaches.  Hematological:  Does not bruise/bleed easily.  Psychiatric/Behavioral:  Negative for depression. The patient is not nervous/anxious.       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   Chicken pox    Colon cancer (HCC)    History of kidney stones     Hypertension    Lipoma of colon    Nephrolithiasis    Nephrolithiasis    Obesity    Shingles    Tubular adenoma of colon    multiple fragments    Past Surgical History:  Procedure Laterality Date   COLON SURGERY     COLONOSCOPY N/A 10/02/2014   Procedure: COLONOSCOPY;  Surgeon: Elnita Maxwell, MD;  Location[facility] ENDOSCOPY;  Service: Endoscopy;  Laterality: N/A;   COLONOSCOPY WITH PROPOFOL N/A 02/16/2017   Procedure: COLONOSCOPY WITH PROPOFOL;  Surgeon: Scot Jun, MD;  Location: [facility] ENDOSCOPY;  Service: Endoscopy;  Laterality: N/A;   COLONOSCOPY WITH PROPOFOL N/A 10/05/2020   Procedure: COLONOSCOPY WITH PROPOFOL;  Surgeon: Regis Bill, MD;  Location: [facility] ENDOSCOPY;  Service: Endoscopy;  Laterality: N/A;   EUS N/A 04/17/2019   Procedure: FULL UPPER ENDOSCOPIC ULTRASOUND (EUS) RADIAL;  Surgeon: Bearl Mulberry, MD;  Location: [facility] ENDOSCOPY;  Service: Gastroenterology;  Laterality: N/A;   KIDNEY STONE SURGERY  PARTIAL COLECTOMY  10/17/2013   PORTACATH PLACEMENT Right 06/13/2019   Procedure: INSERTION PORT-A-CATH;  Surgeon: Sung Amabile, DO;  Location: [facility] ORS;  Service: General;  Laterality: Right;    Family History  Problem Relation Age of Onset   Cancer Mother    Breast cancer Mother    COPD Father     Social History:  reports that he has never smoked. He has never used smokeless tobacco. He reports current alcohol use. He reports that he does not use drugs.    Allergies: No Known Allergies  Current Medications: Current Outpatient Medications  Medication Sig Dispense Refill   acetaminophen (TYLENOL) 500 MG tablet Take 500 mg by mouth every 6 (six) hours as needed.     amLODipine (NORVASC) 2.5 MG tablet Take 2.5 mg by mouth daily.     aspirin EC 81 MG tablet Take 81 mg by mouth daily.     Calcium Carbonate (CALCIUM 600 PO) Take 1 tablet by mouth 2 (two) times daily.     Cholecalciferol (VITAMIN D) 125 MCG (5000 UT) CAPS Take 5,000 mg by mouth.      HYDROcodone-acetaminophen (NORCO/VICODIN) 5-325 MG tablet Take 1 tablet by mouth every 6 (six) hours as needed for moderate pain (up to 3 doses for moderate pain.).     lidocaine-prilocaine (EMLA) cream APPLY TO AFFECTED AREA ONCE AS DIRECTED 30 g 3   loratadine (CLARITIN) 10 MG tablet Take 10 mg by mouth daily.     megestrol (MEGACE) 40 MG tablet TAKE 2 TABLETS BY MOUTH 2 TIMES DAILY. 360 tablet 1   olmesartan (BENICAR) 20 MG tablet Take 1 tablet by mouth daily.     potassium chloride SA (KLOR-CON M20) 20 MEQ tablet Take 1 tablet (20 mEq total) by mouth daily. 180 tablet 0   Probiotic Product (PROBIOTIC DAILY PO) Take 1 tablet by mouth daily.     docusate sodium (COLACE) 100 MG capsule Take 1 capsule (100 mg total) by mouth 2 (two) times daily as needed for mild constipation or moderate constipation. 60 capsule 3   loperamide (IMODIUM) 2 MG capsule Take 1 capsule (2 mg total) by mouth See admin instructions. Take 2 tablets after first loose stool,  then 1 tablet  after each loose stool; maximum: 8 tablets /day (Patient not taking: Reported on 02/27/2023) 60 capsule 0   ondansetron (ZOFRAN) 8 MG tablet Take 1 tablet (8 mg total) by mouth 2 (two) times daily as needed for refractory nausea / vomiting. Start on day 3 after chemotherapy. (Patient not taking: Reported on 02/27/2023) 60 tablet 1   prochlorperazine (COMPAZINE) 10 MG tablet Take 1 tablet (10 mg total) by mouth every 6 (six) hours as needed (NAUSEA). (Patient not taking: Reported on 02/27/2023) 30 tablet 1   senna-docusate (SENNA S) 8.6-50 MG tablet Take 2 tablets by mouth daily. (Patient not taking: Reported on 03/27/2023) 60 tablet 3   No current facility-administered medications for this visit.   Facility-Administered Medications Ordered in Other Visits  Medication Dose Route Frequency Provider Last Rate Last Admin   0.9 %  sodium chloride infusion   Intravenous Once Corcoran, Melissa C, MD       0.9 %  sodium chloride infusion    Intravenous Continuous Nelva Nay C, MD 10 mL/hr at 12/31/19 1000 New Bag at 10/05/20 1045   heparin lock flush 100 unit/mL  500 Units Intravenous Once Corcoran, Melissa C, MD       sodium chloride flush (NS) 0.9 % injection 10 mL  10 mL Intracatheter PRN [name], MD   10 mL at 02/01/23 1341    Performance status (ECOG): 1  Vitals Blood pressure 103/62, pulse 66, temperature 98.5 F (36.9 C), weight 206 lb 9.6 oz (93.7 kg), head circumference 18" (45.7 cm), SpO2 100%.   Physical Exam Constitutional:      General: He is not in acute distress.    Appearance: He is obese. He is not diaphoretic.  HENT:     Head: Normocephalic and atraumatic.  Eyes:     General: No scleral icterus. Cardiovascular:     Rate and Rhythm: Normal rate and regular rhythm.     Heart sounds: No murmur heard. Pulmonary:     Effort: Pulmonary effort is normal. No respiratory distress.     Breath sounds: No wheezing.     Comments: Decreased breath sound bilaterally.  Abdominal:     General: Bowel sounds are normal. There is no distension.     Palpations: Abdomen is soft.  Musculoskeletal:        General: Normal range of motion.     Cervical back: Normal range of motion and neck supple.     Comments: Trace edema bilaterally  Skin:    General: Skin is warm and dry.     Findings: No erythema.  Neurological:     Mental Status: He is alert and oriented to person, place, and time. Mental status is at baseline.     Motor: No abnormal muscle tone.  Psychiatric:        Mood and Affect: Mood and affect normal.     Labs were reviewed by me.     Latest Ref Rng & Units 03/27/2023   10:17 AM 02/27/2023   10:47 AM 02/13/2023   10:11 AM  CBC  WBC 4.0 - 10.5 K/uL 6.7  4.9  4.9   Hemoglobin 13.0 - 17.0 g/dL 16.1  09.6  04.5   Hematocrit 39.0 - 52.0 % 31.2  34.2  34.8   Platelets 150 - 400 K/uL 194  179  184       Latest Ref Rng & Units 03/27/2023   10:17 AM 02/27/2023   10:47 AM 02/13/2023   10:11 AM   CMP  Glucose 70 - 99 mg/dL 409  811  914   BUN 8 - 23 mg/dL 15  17  12    Creatinine 0.61 - 1.24 mg/dL 7.82  9.56  2.13   Sodium 135 - 145 mmol/L 134  136  133   Potassium 3.5 - 5.1 mmol/L 3.8  3.8  3.9   Chloride 98 - 111 mmol/L 107  106  100   CO2 22 - 32 mmol/L 21  24  24    Calcium 8.9 - 10.3 mg/dL 8.8  9.1  9.0   Total Protein 6.5 - 8.1 g/dL 6.0  6.5  6.3   Total Bilirubin <1.2 mg/dL 1.1  0.6  0.8   Alkaline Phos 38 - 126 U/L 40  37  39   AST 15 - 41 U/L 28  25  28    ALT 0 - 44 U/L 16  11  15

## 2023-03-27 NOTE — Assessment & Plan Note (Signed)
Slow growing small lung nodule, discussed with Dr. Rushie Chestnut.  Will hold off radiation and continue close monitor.

## 2023-03-27 NOTE — Assessment & Plan Note (Signed)
Overall he tolerates chemotherapy.   CEA is relatively stable with small fluctuations. CT images shows slight lung progression - Labs reviewed and discussed with patient Finished palliative radiation on 03/05/2023. Repeat CT chest abdomen pelvis in December.  Consider resuming maintenance treatment in Jan 2025

## 2023-03-27 NOTE — Progress Notes (Signed)
Patient here for follow up. Reports that he has been having constipation

## 2023-03-27 NOTE — Assessment & Plan Note (Signed)
decreased due to chemotherapy. continue to monitor.

## 2023-03-27 NOTE — Assessment & Plan Note (Signed)
Chemotherapy plan as listed above 

## 2023-03-27 NOTE — Assessment & Plan Note (Signed)
He has been on Colace 100mg  1-2 times daily. And Miralax daily.

## 2023-03-27 NOTE — Assessment & Plan Note (Signed)
Encourage protein rich diet. Follow up with nutritionist  Recommend Megace 80 mg twice daily as appetite stimulant.

## 2023-03-27 NOTE — Progress Notes (Signed)
Nutrition Follow-up:  Patient with metastatic colon cancer.  S/p transverse colectomy (2015).  Patient on chemo holiday.  Completed radiation  Met with patient and wife today.  Reports that he is having issues with constipation.  MD had wanted him to start taking stool softner, miralax and fiber supplement.  Wife reports that he got new dentures this summer but does not like to wear them while he is eating.  She feels like this is effecting intake as well. He has been to dentist for denture adjustment and was encouraged to use polygrip.     Medications: megace  Labs: reviewed  Anthropometrics:   Weight 206 lb 9.6 oz today  209 lb 14.4 oz on 10/15 215 lb on 10/1 238 lb on 3/5 243 lb on 10/31   15% weight loss in the last year, concerning  NUTRITION DIAGNOSIS: Unintentional weight loss continues    INTERVENTION:  Discussed strategies to help with constipation.  Food to choose handout provided Patient will start bowel regimen Encouraged wearing dentures during eating.    MONITORING, EVALUATION, GOAL: weight trends, intake   NEXT VISIT: Tuesday, Jan 7 during infusion  Angellee Cohill B. Freida Busman, RD, LDN Registered Dietitian [phone]

## 2023-03-28 LAB — CEA: CEA: 14.5 ng/mL — ABNORMAL HIGH (ref 0.0–4.7)

## 2023-04-05 ENCOUNTER — Ambulatory Visit
Admission: RE | Admit: 2023-04-05 | Discharge: 2023-04-05 | Disposition: A | Discharge status: Home / Self Care | Payer: Medicare Other | Source: Ambulatory Visit | Attending: Radiation Oncology | Admitting: Radiation Oncology

## 2023-04-05 VITALS — BP 95/64 | HR 72 | Temp 98.0°F | Resp 18 | Ht 68.0 in

## 2023-04-05 DIAGNOSIS — C786 Secondary malignant neoplasm of retroperitoneum and peritoneum: Secondary | ICD-10-CM | POA: Insufficient documentation

## 2023-04-05 NOTE — Progress Notes (Signed)
Radiation Oncology Follow up Note  Name: Jared Gutierrez   Date:   04/05/2023 MRN:  [id number] DOB: 06-Apr-1941    This 82 y.o. male presents to the clinic today for 1 month follow-up status post palliative radiation therapy to an abdominal mesenteric mass in patient with known stage IV colon cancer.[name]  REFERRING PROVIDER: Barbette Reichmann, MD HPI: The patient, an 82 year old with stage four colon cancer, recently completed palliative radiation therapy to an abdominal mesenteric mass. He is currently on a chemotherapy break, with plans to resume maintenance treatment in January. The patient has been experiencing constipation and has been prescribed a stool softener and Miralax. Despite taking these medications, the patient continues to struggle with constipation and reports a sensation of a lump at the end of his rectum. The patient's last bowel movement was yesterday. The patient also has a 0.7 cm cavitary lesion in the right upper lobe and a 0.5 cm peripheral left upper lobe nodule, both of which are being monitored for potential pulmonary metastasis..  COMPLICATIONS OF TREATMENT: none  FOLLOW UP COMPLIANCE: keeps appointments   PHYSICAL EXAM:  BP 95/64   Pulse 72   Temp 98 F (36.7 C)   Resp 18   Ht 5\' 8"  (1.727 m)   BMI 31.41 kg/m  Sedentary wheelchair-bound male in NAD.  Abdomen abdomen is benign.  Well-developed well-nourished patient in NAD. HEENT reveals PERLA, EOMI, discs not visualized.  Oral cavity is clear. No oral mucosal lesions are identified. Neck is clear without evidence of cervical or supraclavicular adenopathy. Lungs are clear to A&P. Cardiac examination is essentially unremarkable with regular rate and rhythm without murmur rub or thrill. Abdomen is benign with no organomegaly or masses noted. Motor sensory and DTR levels are equal and symmetric in the upper and lower extremities. Cranial nerves II through XII are grossly intact. Proprioception is intact. No peripheral  adenopathy or edema is identified. No motor or sensory levels are noted. Crude visual fields are within normal range.  RADIOLOGY RESULTS: RADIOLOGY Right upper lobe cavitary lesion: 0.7 cm Left upper lobe nodule: 0.5 cm, suspicious for slowly enlarging pulmonary metastasis CT scan has been ordered of his chest abdomen pelvis in the end of December.  PLAN: Stage IV Colon Cancer Completed palliative radiation therapy to an abdominal mesenteric mass. Currently on chemotherapy break with consideration to resume maintenance treatment in January 2025. -Continue current management plan. -Consider resumption of maintenance chemotherapy in January 2025.  Constipation Despite the use of stool softeners and Miralax, patient continues to experience constipation. -Continue stool softeners and Miralax. -Follow dietary recommendations provided by the dietician.  Pulmonary Nodules 0.7 cm cavitary lesion in the right upper lobe and 0.5 cm peripheral left upper lobe nodule, suspicious for slowly enlarging pulmonary metastasis. -Continue monitoring. -Consider short course of radiation therapy if significant changes are noted in December 2024 scan.  Follow-up in 4 months.    Carmina Miller, MD

## 2023-04-11 ENCOUNTER — Other Ambulatory Visit: Payer: Self-pay

## 2023-04-11 ENCOUNTER — Emergency Department
Admission: EM | Admit: 2023-04-11 | Discharge: 2023-04-11 | Discharge status: Against Medical Advice | Payer: Medicare Other | Attending: Emergency Medicine | Admitting: Emergency Medicine

## 2023-04-11 ENCOUNTER — Emergency Department: Payer: Medicare Other

## 2023-04-11 DIAGNOSIS — Z8507 Personal history of malignant neoplasm of pancreas: Secondary | ICD-10-CM | POA: Insufficient documentation

## 2023-04-11 DIAGNOSIS — Z5321 Procedure and treatment not carried out due to patient leaving prior to being seen by health care provider: Secondary | ICD-10-CM | POA: Diagnosis not present

## 2023-04-11 DIAGNOSIS — K5900 Constipation, unspecified: Secondary | ICD-10-CM | POA: Insufficient documentation

## 2023-04-11 DIAGNOSIS — R109 Unspecified abdominal pain: Secondary | ICD-10-CM | POA: Diagnosis present

## 2023-04-11 LAB — COMPREHENSIVE METABOLIC PANEL
ALT: 15 U/L (ref 0–44)
AST: 25 U/L (ref 15–41)
Albumin: 3.5 g/dL (ref 3.5–5.0)
Alkaline Phosphatase: 42 U/L (ref 38–126)
Anion gap: 6 (ref 5–15)
BUN: 15 mg/dL (ref 8–23)
CO2: 21 mmol/L — ABNORMAL LOW (ref 22–32)
Calcium: 9.2 mg/dL (ref 8.9–10.3)
Chloride: 105 mmol/L (ref 98–111)
Creatinine, Ser: 0.82 mg/dL (ref 0.61–1.24)
GFR, Estimated: >60 mL/min (ref >60)
Glucose, Bld: 106 mg/dL — ABNORMAL HIGH (ref 70–99)
Potassium: 4.5 mmol/L (ref 3.5–5.1)
Sodium: 132 mmol/L — ABNORMAL LOW (ref 135–145)
Total Bilirubin: 1 mg/dL (ref <1.2)
Total Protein: 6.6 g/dL (ref 6.5–8.1)

## 2023-04-11 LAB — CBC
HCT: 34 % — ABNORMAL LOW (ref 39.0–52.0)
Hemoglobin: 11.2 g/dL — ABNORMAL LOW (ref 13.0–17.0)
MCH: 31.2 pg (ref 26.0–34.0)
MCHC: 32.9 g/dL (ref 30.0–36.0)
MCV: 94.7 fL (ref 80.0–100.0)
Platelets: 230 10*3/uL (ref 150–400)
RBC: 3.59 MIL/uL — ABNORMAL LOW (ref 4.22–5.81)
RDW: 14.5 % (ref 11.5–15.5)
WBC: 5.7 10*3/uL (ref 4.0–10.5)
nRBC: 0 % (ref 0.0–0.2)

## 2023-04-11 LAB — LIPASE, BLOOD: Lipase: 20 U/L (ref 11–51)

## 2023-04-11 NOTE — ED Notes (Signed)
Pt wife stated that the pt has went to the bathroom and had a BM. Wife said that she was going to take the pt home.

## 2023-04-11 NOTE — ED Triage Notes (Signed)
Pt arrives via POV. Pt reports he has been constipated and experiencing abdominal pain since Last Tuesday. 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 He did have radiation last month and was told he could be constipated from it. PT is AxOx4. NAD.

## 2023-05-07 ENCOUNTER — Ambulatory Visit
Admission: RE | Admit: 2023-05-07 | Discharge: 2023-05-07 | Disposition: A | Discharge status: Home / Self Care | Payer: Medicare Other | Source: Ambulatory Visit | Attending: Oncology | Admitting: Oncology

## 2023-05-07 DIAGNOSIS — C184 Malignant neoplasm of transverse colon: Secondary | ICD-10-CM | POA: Diagnosis present

## 2023-05-07 MED ORDER — IOHEXOL 300 MG/ML  SOLN
100.0000 mL | Freq: Once | INTRAMUSCULAR | Status: AC | PRN
  Administered 2023-05-07: 100 mL via INTRAVENOUS

## 2023-05-14 ENCOUNTER — Encounter: Payer: Self-pay | Admitting: Oncology

## 2023-05-22 ENCOUNTER — Inpatient Hospital Stay: Payer: Medicare Other

## 2023-05-22 ENCOUNTER — Inpatient Hospital Stay: Payer: Medicare Other | Attending: Oncology

## 2023-05-22 ENCOUNTER — Encounter: Payer: Self-pay | Admitting: Oncology

## 2023-05-22 ENCOUNTER — Inpatient Hospital Stay (HOSPITAL_BASED_OUTPATIENT_CLINIC_OR_DEPARTMENT_OTHER): Payer: Medicare Other | Admitting: Oncology

## 2023-05-22 VITALS — BP 161/74 | HR 58 | Temp 97.0°F | Resp 19

## 2023-05-22 VITALS — BP 130/72 | HR 72 | Temp 97.8°F | Wt 213.5 lb

## 2023-05-22 DIAGNOSIS — Z803 Family history of malignant neoplasm of breast: Secondary | ICD-10-CM | POA: Diagnosis not present

## 2023-05-22 DIAGNOSIS — Z860100 Personal history of colon polyps, unspecified: Secondary | ICD-10-CM | POA: Diagnosis not present

## 2023-05-22 DIAGNOSIS — R918 Other nonspecific abnormal finding of lung field: Secondary | ICD-10-CM | POA: Insufficient documentation

## 2023-05-22 DIAGNOSIS — Z836 Family history of other diseases of the respiratory system: Secondary | ICD-10-CM | POA: Diagnosis not present

## 2023-05-22 DIAGNOSIS — Z809 Family history of malignant neoplasm, unspecified: Secondary | ICD-10-CM | POA: Diagnosis not present

## 2023-05-22 DIAGNOSIS — K648 Other hemorrhoids: Secondary | ICD-10-CM | POA: Diagnosis not present

## 2023-05-22 DIAGNOSIS — T451X5D Adverse effect of antineoplastic and immunosuppressive drugs, subsequent encounter: Secondary | ICD-10-CM | POA: Insufficient documentation

## 2023-05-22 DIAGNOSIS — C786 Secondary malignant neoplasm of retroperitoneum and peritoneum: Secondary | ICD-10-CM | POA: Diagnosis not present

## 2023-05-22 DIAGNOSIS — Z5112 Encounter for antineoplastic immunotherapy: Secondary | ICD-10-CM | POA: Insufficient documentation

## 2023-05-22 DIAGNOSIS — C184 Malignant neoplasm of transverse colon: Secondary | ICD-10-CM

## 2023-05-22 DIAGNOSIS — Z452 Encounter for adjustment and management of vascular access device: Secondary | ICD-10-CM | POA: Diagnosis not present

## 2023-05-22 DIAGNOSIS — T451X5A Adverse effect of antineoplastic and immunosuppressive drugs, initial encounter: Secondary | ICD-10-CM

## 2023-05-22 DIAGNOSIS — Z79899 Other long term (current) drug therapy: Secondary | ICD-10-CM | POA: Insufficient documentation

## 2023-05-22 DIAGNOSIS — Z5111 Encounter for antineoplastic chemotherapy: Secondary | ICD-10-CM

## 2023-05-22 DIAGNOSIS — Z85038 Personal history of other malignant neoplasm of large intestine: Secondary | ICD-10-CM

## 2023-05-22 DIAGNOSIS — D6481 Anemia due to antineoplastic chemotherapy: Secondary | ICD-10-CM | POA: Diagnosis not present

## 2023-05-22 DIAGNOSIS — Z7982 Long term (current) use of aspirin: Secondary | ICD-10-CM | POA: Diagnosis not present

## 2023-05-22 LAB — CBC WITH DIFFERENTIAL/PLATELET
Abs Immature Granulocytes: 0.01 10*3/uL (ref 0.00–0.07)
Basophils Absolute: 0.1 10*3/uL (ref 0.0–0.1)
Basophils Relative: 1 %
Eosinophils Absolute: 0.1 10*3/uL (ref 0.0–0.5)
Eosinophils Relative: 2 %
HCT: 31.3 % — ABNORMAL LOW (ref 39.0–52.0)
Hemoglobin: 10.3 g/dL — ABNORMAL LOW (ref 13.0–17.0)
Immature Granulocytes: 0 %
Lymphocytes Relative: 18 %
Lymphs Abs: 0.9 10*3/uL (ref 0.7–4.0)
MCH: 31.7 pg (ref 26.0–34.0)
MCHC: 32.9 g/dL (ref 30.0–36.0)
MCV: 96.3 fL (ref 80.0–100.0)
Monocytes Absolute: 0.4 10*3/uL (ref 0.1–1.0)
Monocytes Relative: 8 %
Neutro Abs: 3.5 10*3/uL (ref 1.7–7.7)
Neutrophils Relative %: 71 %
Platelets: 204 10*3/uL (ref 150–400)
RBC: 3.25 MIL/uL — ABNORMAL LOW (ref 4.22–5.81)
RDW: 13.7 % (ref 11.5–15.5)
WBC: 4.9 10*3/uL (ref 4.0–10.5)
nRBC: 0 % (ref 0.0–0.2)

## 2023-05-22 LAB — COMPREHENSIVE METABOLIC PANEL
ALT: 32 U/L (ref 0–44)
AST: 36 U/L (ref 15–41)
Albumin: 3 g/dL — ABNORMAL LOW (ref 3.5–5.0)
Alkaline Phosphatase: 60 U/L (ref 38–126)
Anion gap: 9 (ref 5–15)
BUN: 11 mg/dL (ref 8–23)
CO2: 24 mmol/L (ref 22–32)
Calcium: 8.6 mg/dL — ABNORMAL LOW (ref 8.9–10.3)
Chloride: 107 mmol/L (ref 98–111)
Creatinine, Ser: 0.5 mg/dL — ABNORMAL LOW (ref 0.61–1.24)
GFR, Estimated: >60 mL/min (ref >60)
Glucose, Bld: 98 mg/dL (ref 70–99)
Potassium: 3.4 mmol/L — ABNORMAL LOW (ref 3.5–5.1)
Sodium: 140 mmol/L (ref 135–145)
Total Bilirubin: 1 mg/dL (ref 0.0–1.2)
Total Protein: 5.6 g/dL — ABNORMAL LOW (ref 6.5–8.1)

## 2023-05-22 LAB — PROTEIN, URINE, RANDOM: Total Protein, Urine: 74 mg/dL

## 2023-05-22 MED ORDER — SODIUM CHLORIDE 0.9 % IV SOLN
400.0000 mg/m2 | Freq: Once | INTRAVENOUS | Status: AC
  Administered 2023-05-22: 860 mg via INTRAVENOUS
  Filled 2023-05-22: qty 43

## 2023-05-22 MED ORDER — SODIUM CHLORIDE 0.9 % IV SOLN
2400.0000 mg/m2 | INTRAVENOUS | Status: DC
  Administered 2023-05-22: 5000 mg via INTRAVENOUS
  Filled 2023-05-22: qty 100

## 2023-05-22 MED ORDER — SODIUM CHLORIDE 0.9 % IV SOLN
Freq: Once | INTRAVENOUS | Status: AC
  Filled 2023-05-22: qty 250

## 2023-05-22 MED ORDER — SODIUM CHLORIDE 0.9 % IV SOLN
5.0000 mg/kg | Freq: Once | INTRAVENOUS | Status: AC
  Administered 2023-05-22: 500 mg via INTRAVENOUS
  Filled 2023-05-22: qty 16

## 2023-05-22 NOTE — Assessment & Plan Note (Signed)
Slow growing small lung nodule, discussed with Dr. Rushie Chestnut.  Will hold off radiation and continue close monitor.

## 2023-05-22 NOTE — Assessment & Plan Note (Addendum)
 Overall he tolerates chemotherapy.   CEA is relatively stable with small fluctuations. CT images shows slight lung progression - Labs reviewed and discussed with patient Finished palliative radiation on 03/05/2023. Repeat CT chest abdomen pelvis in December.  Labs are reviewed and discussed with patient. CT scan results were review. Stable disease.  Resume 5-FU and Bevavizumab today

## 2023-05-22 NOTE — Patient Instructions (Signed)
 [facility] - A DEPT OF [facility]  Discharge Instructions: Thank you for choosing [facility] to provide your oncology and hematology care.  If you have a lab appointment with the Cancer Center, please go directly to the Cancer Center and check in at the registration area.  Wear comfortable clothing and clothing appropriate for easy access to any Portacath or PICC line.   We strive to give you quality time with your provider. You may need to reschedule your appointment if you arrive late (15 or more minutes).  Arriving late affects you and other patients whose appointments are after yours.  Also, if you miss three or more appointments without notifying the office, you may be dismissed from the clinic at the provider's discretion.      For prescription refill requests, have your pharmacy contact our office and allow 72 hours for refills to be completed.    Today you received the following chemotherapy and/or immunotherapy agents bevacizumab , leucovorin , and adrucil       To help prevent nausea and vomiting after your treatment, we encourage you to take your nausea medication as directed.  BELOW ARE SYMPTOMS THAT SHOULD BE REPORTED IMMEDIATELY: *FEVER GREATER THAN 100.4 F (38 C) OR HIGHER *CHILLS OR SWEATING *NAUSEA AND VOMITING THAT IS NOT CONTROLLED WITH YOUR NAUSEA MEDICATION *UNUSUAL SHORTNESS OF BREATH *UNUSUAL BRUISING OR BLEEDING *URINARY PROBLEMS (pain or burning when urinating, or frequent urination) *BOWEL PROBLEMS (unusual diarrhea, constipation, pain near the anus) TENDERNESS IN MOUTH AND THROAT WITH OR WITHOUT PRESENCE OF ULCERS (sore throat, sores in mouth, or a toothache) UNUSUAL RASH, SWELLING OR PAIN  UNUSUAL VAGINAL DISCHARGE OR ITCHING   Items with * indicate a potential emergency and should be followed up as soon as possible or go to the Emergency Department if any problems should occur.  Please show the CHEMOTHERAPY  ALERT CARD or IMMUNOTHERAPY ALERT CARD at check-in to the Emergency Department and triage nurse.  Should you have questions after your visit or need to cancel or reschedule your appointment, please contact [facility] - A DEPT OF [facility]  [phone] and follow the prompts.  Office hours are 8:00 a.m. to 4:30 p.m. Monday - Friday. Please note that voicemails left after 4:00 p.m. may not be returned until the following business day.  We are closed weekends and major holidays. You have access to a nurse at all times for urgent questions. Please call the main number to the clinic [phone] and follow the prompts.  For any non-urgent questions, you may also contact your provider using MyChart. We now offer e-Visits for anyone 45 and older to request care online for non-urgent symptoms. For details visit [email or web address].   Also download the MyChart app! Go to the app store, search MyChart, open the app, select [facility], and log in with your MyChart username and password.

## 2023-05-22 NOTE — Assessment & Plan Note (Signed)
 Chemotherapy plan as listed above

## 2023-05-22 NOTE — Progress Notes (Signed)
 Hematology/Oncology Progress note [phone] [phone]    Chief Complaint: Jared Gutierrez is a 83 y.o.  male presents for follow-up of metastatic colon cancer   ASSESSMENT & PLAN:   Cancer Staging  Cancer of transverse colon [facility]) Staging form: Colon and Rectum, [name], [name], cM1) - Signed by Babara Call, MD on 10/26/2021   Cancer of transverse colon (HCC) Overall he tolerates chemotherapy.   CEA is relatively stable with small fluctuations. CT images shows slight lung progression - Labs reviewed and discussed with patient Finished palliative radiation on 03/05/2023. Repeat CT chest abdomen pelvis in December.  Labs are reviewed and discussed with patient. CT scan results were review. Stable disease.  Resume 5-FU and Bevavizumab today  Anemia due to chemotherapy decreased due to chemotherapy. continue to monitor.  Encounter for antineoplastic chemotherapy Chemotherapy plan as listed above.   Lung nodules Slow growing small lung nodule, discussed with Dr. Lenn.  Will hold off radiation and continue close monitor.   Follow-up 2 weeks  All questions were answered. The patient knows to call the clinic with any problems, questions or concerns.  Call Babara, MD, PhD [facility] Hematology Oncology 05/22/2023     PERTINENT ONCOLOGY HISTORY Jared Gutierrez is a 83 y.o.[place] who has above oncology history reviewed by me today presented for follow up visit for management of Metastatic colon cancer. [name], patient switched care to me on 11/11/20 Extensive medical record review was performed by me  Oncology History  History of colon cancer, stage I  10/02/2014 Pathology Results   [id number] colonoscopy pathology A. CECAL POLYPS; HOT SNARE AND COLD BIOPSY:  - TUBULAR ADENOMA, MULTIPLE FRAGMENTS.  - NEGATIVE FOR HIGH-GRADE DYSPLASIA AND MALIGNANCY.   B. COLON POLYP,  TRANSVERSE; COLD BIOPSY:  - TUBULAR ADENOMA.  - NEGATIVE FOR HIGH-GRADE DYSPLASIA AND MALIGNANCY.     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  Tumor extended into the submucosa. Margins were negative. + lymphovascular invasion. 14 lymph nodes were negative. Pathologic stage was T1 N0.  TRANSVERSE COLON, RESECTION:  - INVASIVE ADENOCARCINOMA ARISING IN A 4.6 CM TUBULOVILLOUS  ADENOMA WITH HIGH GRADE DYSPLASIA (MALIGNANT POLYP).  - SEE SUMMARY BELOW.  SABRA  ONCOLOGY SUMMARY: COLON AND RECTUM, RESECTION, AJCC 7TH EDITION  Specimen: transverse colon  Procedure: transverse colon resection  Tumor site: splenic flexure  Tumor size: 1.0 cm (invasive component)  Macroscopic tumor perforation: not specified  Histologic type: invasive adenocarcinoma  Histologic grade: moderately differentiated  Microscopic tumor extension: into submucosa  Margins:       Proximal margin: negative       Distal margin: negative  Circumferential (radial) or mesenteric margin: negative       If all margins uninvolved by invasive carcinoma:       Distance of invasive carcinoma from closest margin: 7.5 cm  to distal margin  Treatment effect: not  applicable  Lymph-vascular invasion: present  Perineural invasion: not identified  Tumor deposits (discontinuous extramural extension): not  identified  Pathologic staging:       Primary tumor: pT1       Regional lymph nodes: pN0            Number of nodes examined: 14            Number of nodes involved: 0    04/02/2019 Progression   04/02/2019  PET scan  limited evealed a 2.4 x 2.3 cm (SUV 11) hypermetabolic soft tissue density caudal and anterior to the pancreatic neck favored to represent isolated peritoneal or nodal metastasis in the setting of prior transverse colonic resection (expected primary drainage). Although this was immediately adjacent to the pancreas, a fat plane was maintained, arguing strongly against a pancreatic primary. Otherwise, there was no evidence of hypermetabolic metastasis. 04/17/2019 EUS on  revealed a normal esophagus, stomach, duodenum, and pancreas.  There was a 2.4 x 2.4 cm irregular mass in the retroperitoneum adjacent to, but not involving the pancreatic neck.  FNA and core needle biopsy were performed.  Pathology revealed adenocarcinoma compatible with a metastatic lesion of colorectal origin.  Tumor cells were positive for CK20 and CDX2 and negative for CK7.  NGS: [employer] on 05/15/2019 revealed + [id number] and TP53.  Negative results included BRAF V600E, Her2, NRAS, NTRK, PD-L1 (<1%), and TMB 8.7/Mb (intermediate).  MMR testing from his colon resection on 10/16/2013 was intact with a low probability of MSI-H.   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  He received Neulasta  after cycle #4 and #5 secondary to progressive leukopenia.  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  He received a truncated course of FOLFOX with cycle #11 secondary to oxaliplatin  reaction.   01/14/2020 Imaging    PET revealed  an interval decrease in size and FDG uptake (2.4 x 2.3 cm with SUV 11 to 2.4 x 1.7 cm with SUV 4.09) associated with the previously referenced soft tissue density caudal and anterior to the pancreatic neck suggesting treatment response. There were no new sites of FDG avid tumor.   08/31/2020 Imaging   08/31/2020 Abdomen and pelvis CT revealed increased size of the index soft tissue lesion inferior and anterior to the pancreatic neck (2.9 x 1.8 cm to 3.5 x 2.9 cm). There were similar prominent retroperitoneal lymph nodes without adenopathy by size criteria. There was no new or enlarging abdominal or pelvic lymph nodes. There were no new interval findings. There was similar circumferential wall thickening of a nondistended urinary bladder, which likely accentuated wall thickening There was hepatic steatosis and aortic atherosclerosis.   11/03/2020, PET showed recurrent peritoneal metastasis in the upper abdomen adjacent to the pancreas.  The lesion is 3.8 x 2.9 cm with SUV of 11.3. No evidence of metastatic peritoneal disease elsewhere in the abdomen pelvis.  No evidence of distant metastasis.   11/03/2020 Imaging   PET scan showed recurrent peritoneal metastasis near pancreas. Given that he has no other distant metastasis. Discussed with radiation oncology. Repeat SBRT may be considered if he does  not respond well to systemic chemotherapy.     11/26/2020 -  Chemotherapy   5-FU and bevacizumab .   02/17/2021 Imaging   02/17/2021 CT abdomen pelvis showed partial response. Mild decrease of peritoneal lesion.  Proceed with 5-FU and bevacizumab  today.  Given that he is tolerating current regimen with good life quality, partial response.  Shared decision was made to hold off adding additional chemotherapy agents.   06/08/2021, CT chest abdomen pelvis with contrast showed soft tissue mass at the base of mesentery inferior to the pancreatic neck is unchanged in size.  No progressive disease was identified.    09/07/2021, CT chest abdomen pelvis with contrast showed no substantial changes in size of the mesenteric mass, 2.6 cm.  No suspicious new lesions.  Chronic findings as detailed in the imaging report.   12/05/2021 Imaging   PET scan showed soft tissue mesenteric mass is unchanged in size.  Decreased FDG uptake compared to activity on November 03, 2020.  Fever treated disease.  No new metastatic disease.   03/21/2022 Imaging   CT chest abdomen pelvis  1. Similar size of the peripancreatic presumed peritoneal implant compared to 09/07/2021. 2. No new or progressive disease. 3. Incidental findings, including: Coronary artery atherosclerosis.Aortic Atherosclerosis (ICD10-I70.0). Prostatomegaly with chronic bladder wall thickening, suggesting outlet obstruction.     07/11/2022 Imaging   CT chest abdomen pelvis with contrast showed 1. New 6 mm nodule in the right upper lobe and 2 mm nodule in the left upper lobe. Although small these are concerning. Recommend short follow-up in 3 months 2. Stable low-attenuation lesion along the midbody of the pancreas. Continued attention on follow-up. No new peritoneal mass lesions or ascites. 3. Fatty liver infiltration 4. Colonic surgical changes   10/03/2022 Imaging   CT chest abdomen pelvis w contrast showed 1. Slight interval enlargement and cavitation of a nodule in the right upper lobe, measuring 0.6 cm, previously 0.5 cm. Slight interval enlargement of a left upper lobe nodule measuring 0.3 cm,previously 0.2 cm. Findings are concerning for slowly enlarging small pulmonary metastases. 2. No significant change in a lobulated soft tissue mass within the central small bowel mesentery abutting the inferior aspect of the pancreas, consistent with a stable, treated metastasis. 3. No other evidence of lymphadenopathy or metastatic disease in the chest, abdomen, or pelvis. 4. Prostatomegaly with diffuse wall thickening of the relatively decompressed urinary bladder,  likely secondary to chronic outlet obstruction. 5. Unchanged fibrotic scarring and bronchiectasis of the bilateral lung bases. This could be further assessed by pulmonary referral and dedicated interstitial lung disease protocol CT examination of the chest if and when clinically appropriate in the setting of known metastatic malignancy. Aortic Atherosclerosis    12/27/2022 Imaging   CT chest abdomen pelvis w contrast showed 1. Continued slight interval enlargement of a cavitary nodule in the right upper lobe measuring 0.7 x 0.6 cm, as well as a nodule in the peripheral left upper lobe measuring 0.5 cm. These are highly suspicious for slowly enlarging pulmonary metastases but remain below size threshold for reliable PET-CT characterization. 2. Unchanged, lobulated hypodense soft tissue mass in the central small bowel mesentery, consistent with treated metastatic disease. 3. Status post transverse colon resection and reanastomosis. 4. Thickening of the distal sigmoid colon and rectum, consistent with nonspecific infectious or inflammatory colitis. 5. Prostatomegaly with diffuse thickening of the urinary bladder wall, likely secondary to chronic outlet obstruction.   Aortic Atherosclerosis (ICD10-I70.0).   01/09/2023 Imaging   PET scan showed Status post transverse  colectomy. 4.2 cm soft tissue mass in the central abdominal mesentery, suspicious for residual/recurrent nodal metastasis. Additional 8 mm short axis left para-aortic nodal metastasis. 7 mm cavitary right upper lobe nodule and 5 mm left upper lobe nodule, both beneath the size threshold for PET sensitivity, but suspicious for small pulmonary metastases.   01/30/2023 -  Radiation Therapy   Palliative radiation to mesenteric soft tissue mass.     Other medical problems Chronic lower extremity edema. 12/24/2018, right lower extremity duplex negative for DVT.  Small right Baker's cyst. 08/16/2019, bilateral lower extremity duplex showed  no DVT. 08/16/2019 - 08/17/2019 with right lower extremity cellulitis.  He was unable to bear weight.  He was treated with IV fluids, NSAIDs, colchicine , and broad antibiotics (vancomycin  and Cefepime ).  He was discharged on indomethacin  x 5 days and Keflex  500 mg TID x 5 days.  10/05/2020, colonoscopy showed internal hemorrhoids.  Otherwise normal examination.  INTERVAL HISTORY Jared Gutierrez is a 83 y.o. male who has above history reviewed by me today presents for follow up visit for management of recurrent metastatic colon cancer Patient was accompanied by wife Denies fever, chills, nausea, vomiting, diarrhea, chest pain, shortness of breath, abdominal pain,  Appetite is fair.    Review of Systems  Constitutional:  Negative for appetite change, chills, diaphoresis, fever and unexpected weight change.  HENT:   Negative for hearing loss, nosebleeds, sore throat and tinnitus.   Respiratory:  Negative for cough, hemoptysis and shortness of breath.   Cardiovascular:  Negative for chest pain and palpitations.  Gastrointestinal:  Negative for abdominal pain, blood in stool, constipation, diarrhea, nausea and vomiting.  Genitourinary:  Negative for dysuria, frequency and hematuria.   Musculoskeletal:  Positive for arthralgias. Negative for back pain, myalgias and neck pain.  Skin:  Negative for itching and rash.  Neurological:  Positive for numbness. Negative for dizziness and headaches.  Hematological:  Does not bruise/bleed easily.  Psychiatric/Behavioral:  Negative for depression. The patient is not nervous/anxious.       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   Chicken pox    Colon cancer (HCC)    History of kidney stones    Hypertension    Lipoma of colon    Nephrolithiasis    Nephrolithiasis    Obesity    Shingles    Tubular adenoma of colon    multiple fragments    Past  Surgical History:  Procedure Laterality Date   COLON SURGERY     COLONOSCOPY N/A 10/02/2014   Procedure: COLONOSCOPY;  Surgeon: Donnice Vaughn Manes, MD;  Location: [facility] ENDOSCOPY;  Service: Endoscopy;  Laterality: N/A;   COLONOSCOPY WITH PROPOFOL  N/A 02/16/2017   Procedure: COLONOSCOPY WITH PROPOFOL ;  Surgeon: Viktoria Lamar DASEN, MD;  Location: [facility] ENDOSCOPY;  Service: Endoscopy;  Laterality: N/A;   COLONOSCOPY WITH PROPOFOL  N/A 10/05/2020   Procedure: COLONOSCOPY WITH PROPOFOL ;  Surgeon: Maryruth Ole DASEN, MD;  Location: [facility] ENDOSCOPY;  Service: Endoscopy;  Laterality: N/A;   EUS N/A 04/17/2019   Procedure: FULL UPPER ENDOSCOPIC ULTRASOUND (EUS) RADIAL;  Surgeon: Queenie Asberry LABOR, MD;  Location: [facility] ENDOSCOPY;  Service: Gastroenterology;  Laterality: N/A;   KIDNEY STONE SURGERY     PARTIAL COLECTOMY  10/17/2013   PORTACATH PLACEMENT Right 06/13/2019   Procedure: INSERTION PORT-A-CATH;  Surgeon: Tye Millet, DO;  Location: [facility] ORS;  Service: General;  Laterality: Right;    Family History  Problem Relation Age of Onset   Cancer Mother    Breast cancer Mother    COPD Father     Social History:  reports that he has never smoked. He has never used smokeless tobacco. He reports current alcohol  use. He reports that he does not use drugs.    Allergies: No Known Allergies  Current Medications: Current Outpatient Medications  Medication Sig Dispense Refill   acetaminophen  (TYLENOL ) 500 MG tablet Take 500 mg by mouth every 6 (six) hours as needed.     amLODipine  (NORVASC ) 2.5 MG tablet Take 2.5 mg by mouth daily.     aspirin  EC 81 MG tablet Take 81 mg by mouth daily.     Calcium  Carbonate (CALCIUM  600 PO) Take 1 tablet by mouth 2 (two) times daily.     Cholecalciferol (VITAMIN D) 125 MCG (5000 UT) CAPS Take 5,000 mg by mouth.     docusate sodium  (COLACE) 100 MG capsule Take 1 capsule (100 mg total) by mouth 2 (two) times daily as needed for mild constipation or moderate constipation.  60 capsule 3   HYDROcodone -acetaminophen  (NORCO/VICODIN) 5-325 MG tablet Take 1 tablet by mouth every 6 (six) hours as needed for moderate pain (up to 3 doses for moderate pain.).     lidocaine -prilocaine  (EMLA ) cream APPLY TO AFFECTED AREA ONCE AS DIRECTED 30 g 3   loratadine  (CLARITIN ) 10 MG tablet Take 10 mg by mouth daily.     megestrol  (MEGACE ) 40 MG tablet TAKE 2 TABLETS BY MOUTH 2 TIMES DAILY. 360 tablet 1   olmesartan (BENICAR) 20 MG tablet Take 1 tablet by mouth daily.     ondansetron  (ZOFRAN ) 8 MG tablet Take 1 tablet (8 mg total) by mouth 2 (two) times daily as needed for refractory nausea / vomiting. Start on day 3 after chemotherapy. 60 tablet 1   potassium chloride  SA (KLOR-CON  M20) 20 MEQ tablet Take 1 tablet (20 mEq total) by mouth daily. 180 tablet 0   Probiotic Product (PROBIOTIC DAILY PO) Take 1 tablet by mouth daily.     loperamide  (IMODIUM ) 2 MG capsule Take 1 capsule (2 mg total) by mouth See admin instructions. Take 2 tablets after first loose stool,  then 1 tablet  after each loose stool; maximum: 8 tablets /day (Patient not taking: Reported on 05/22/2023) 60 capsule 0   prochlorperazine  (COMPAZINE ) 10 MG tablet Take 1 tablet (10 mg total) by mouth every 6 (six) hours as needed (NAUSEA). (Patient not taking: Reported on 05/22/2023) 30 tablet 1   senna-docusate (SENNA S) 8.6-50 MG tablet Take 2 tablets by mouth daily. (Patient not taking: Reported on 05/22/2023) 60 tablet 3   No current facility-administered medications for this visit.   Facility-Administered Medications Ordered in Other Visits  Medication Dose Route Frequency Provider Last Rate Last Admin   0.9 %  sodium chloride  infusion   Intravenous Once Corcoran, Melissa C, MD       0.9 %  sodium chloride  infusion   Intravenous Continuous Corcoran, Melissa C, MD 10 mL/hr at 12/31/19 1000 New Bag at 10/05/20 1045   0.9 %  sodium chloride  infusion   Intravenous Once Tayjon Halladay, MD       bevacizumab -adcd (VEGZELMA ) 500 mg in  sodium chloride  0.9 % 100 mL chemo infusion  5 mg/kg (Order-Specific) Intravenous Once Jshon Ibe, MD       fluorouracil  (ADRUCIL ) 5,000 mg in sodium chloride  0.9 % 150 mL chemo infusion  2,400 mg/m2 (  Order-Specific) Intravenous 1 day or 1 dose Babara Call, MD       heparin  lock flush 100 unit/mL  500 Units Intravenous Once Corcoran, Melissa C, MD       leucovorin  860 mg in sodium chloride  0.9 % 250 mL infusion  400 mg/m2 (Order-Specific) Intravenous Once Babara Call, MD       sodium chloride  flush (NS) 0.9 % injection 10 mL  10 mL Intracatheter PRN Babara Call, MD   10 mL at 02/01/23 1341    Performance status (ECOG): 1  Vitals Blood pressure 130/72, pulse 72, temperature 97.8 F (36.6 C), temperature source Tympanic, weight 213 lb 8 oz (96.8 kg), SpO2 100%.   Physical Exam Constitutional:      General: He is not in acute distress.    Appearance: He is obese. He is not diaphoretic.  HENT:     Head: Normocephalic and atraumatic.  Eyes:     General: No scleral icterus. Cardiovascular:     Rate and Rhythm: Normal rate and regular rhythm.     Heart sounds: No murmur heard. Pulmonary:     Effort: Pulmonary effort is normal. No respiratory distress.     Breath sounds: No wheezing.     Comments: Decreased breath sound bilaterally.  Abdominal:     General: Bowel sounds are normal. There is no distension.     Palpations: Abdomen is soft.  Musculoskeletal:        General: Normal range of motion.     Cervical back: Normal range of motion and neck supple.     Comments: Trace edema bilaterally  Skin:    General: Skin is warm and dry.     Findings: No erythema.  Neurological:     Mental Status: He is alert and oriented to person, place, and time. Mental status is at baseline.     Motor: No abnormal muscle tone.  Psychiatric:        Mood and Affect: Mood and affect normal.     Labs were reviewed by me.     Latest Ref Rng & Units 05/22/2023    8:31 AM 04/11/2023    5:56 PM 03/27/2023   10:17 AM   CBC  WBC 4.0 - 10.5 K/uL 4.9  5.7  6.7   Hemoglobin 13.0 - 17.0 g/dL 89.6  88.7  89.7   Hematocrit 39.0 - 52.0 % 31.3  34.0  31.2   Platelets 150 - 400 K/uL 204  230  194       Latest Ref Rng & Units 05/22/2023    8:31 AM 04/11/2023    5:56 PM 03/27/2023   10:17 AM  CMP  Glucose 70 - 99 mg/dL 98  893  864   BUN 8 - 23 mg/dL 11  15  15    Creatinine 0.61 - 1.24 mg/dL 9.49  9.17  9.27   Sodium 135 - 145 mmol/L 140  132  134   Potassium 3.5 - 5.1 mmol/L 3.4  4.5  3.8   Chloride 98 - 111 mmol/L 107  105  107   CO2 22 - 32 mmol/L 24  21  21    Calcium  8.9 - 10.3 mg/dL 8.6  9.2  8.8   Total Protein 6.5 - 8.1 g/dL 5.6  6.6  6.0   Total Bilirubin 0.0 - 1.2 mg/dL 1.0  1.0  1.1   Alkaline Phos 38 - 126 U/L 60  42  40   AST 15 - 41 U/L 36  25  28   ALT 0 - 44 U/L 32  15  16

## 2023-05-22 NOTE — Progress Notes (Signed)
 Nutrition Follow-up:  Patient with metastatic colon cancer.  S/p transverse colectomy (2015).  Patient coming off chemo holiday and restarting bevicizumab and fluorouracil    Met with patient during infusion.  Reports that appetite has been better.  Eating more meats and vegetables.  Trying to drink more fluid to help constipation.  Reports having bowel movement daily or every other day.  Has not taken miralax in the last 4-5 weeks.  Has been eating 3 meals a day.  Drinking ensure daily.      Medications: reviewed  Labs: reviewed  Anthropometrics:   Weight 213 lb 8 oz today, increased  206 lb 9.6 oz on 11/12 209 lb 14.4 oz on 10/15 215 lb on 10/1 238 lb on 3/5 243 lb on 10/31   NUTRITION DIAGNOSIS: Unintentional weight loss continues    INTERVENTION:  Encouraged hydration. Encouraged foods high in fiber (fruits, vegetables, whole grains) to help with bowel movements Continue ensure shakes for added nutrition    MONITORING, EVALUATION, GOAL: weight trends, intake   NEXT VISIT: as needed  Jilliam Bellmore B. Dasie, RD, LDN Registered Dietitian [phone]

## 2023-05-22 NOTE — Assessment & Plan Note (Signed)
decreased due to chemotherapy. continue to monitor.

## 2023-05-24 ENCOUNTER — Inpatient Hospital Stay: Payer: Medicare Other

## 2023-05-24 DIAGNOSIS — C786 Secondary malignant neoplasm of retroperitoneum and peritoneum: Secondary | ICD-10-CM

## 2023-05-24 DIAGNOSIS — Z85038 Personal history of other malignant neoplasm of large intestine: Secondary | ICD-10-CM

## 2023-05-24 DIAGNOSIS — Z5112 Encounter for antineoplastic immunotherapy: Secondary | ICD-10-CM | POA: Diagnosis not present

## 2023-05-24 MED ORDER — SODIUM CHLORIDE 0.9% FLUSH
10.0000 mL | INTRAVENOUS | Status: DC | PRN
  Administered 2023-05-24: 10 mL
  Filled 2023-05-24: qty 10

## 2023-05-24 MED ORDER — HEPARIN SOD (PORK) LOCK FLUSH 100 UNIT/ML IV SOLN
500.0000 [IU] | Freq: Once | INTRAVENOUS | Status: AC | PRN
Start: 2023-05-24 — End: 2023-05-24
  Administered 2023-05-24: 500 [IU]
  Filled 2023-05-24: qty 5

## 2023-05-29 ENCOUNTER — Encounter: Payer: Self-pay | Admitting: Oncology

## 2023-06-05 ENCOUNTER — Inpatient Hospital Stay: Payer: Medicare Other

## 2023-06-05 ENCOUNTER — Encounter: Payer: Self-pay | Admitting: Oncology

## 2023-06-05 ENCOUNTER — Inpatient Hospital Stay (HOSPITAL_BASED_OUTPATIENT_CLINIC_OR_DEPARTMENT_OTHER): Payer: Medicare Other | Admitting: Oncology

## 2023-06-05 VITALS — BP 146/85 | HR 65 | Temp 97.6°F | Resp 18 | Wt 217.2 lb

## 2023-06-05 DIAGNOSIS — C786 Secondary malignant neoplasm of retroperitoneum and peritoneum: Secondary | ICD-10-CM

## 2023-06-05 DIAGNOSIS — T451X5D Adverse effect of antineoplastic and immunosuppressive drugs, subsequent encounter: Secondary | ICD-10-CM

## 2023-06-05 DIAGNOSIS — D6481 Anemia due to antineoplastic chemotherapy: Secondary | ICD-10-CM | POA: Diagnosis not present

## 2023-06-05 DIAGNOSIS — Z5112 Encounter for antineoplastic immunotherapy: Secondary | ICD-10-CM | POA: Diagnosis not present

## 2023-06-05 DIAGNOSIS — Z5111 Encounter for antineoplastic chemotherapy: Secondary | ICD-10-CM

## 2023-06-05 DIAGNOSIS — Z85038 Personal history of other malignant neoplasm of large intestine: Secondary | ICD-10-CM

## 2023-06-05 DIAGNOSIS — R918 Other nonspecific abnormal finding of lung field: Secondary | ICD-10-CM | POA: Diagnosis not present

## 2023-06-05 DIAGNOSIS — C184 Malignant neoplasm of transverse colon: Secondary | ICD-10-CM | POA: Diagnosis not present

## 2023-06-05 LAB — CBC WITH DIFFERENTIAL/PLATELET
Abs Immature Granulocytes: 0.01 10*3/uL (ref 0.00–0.07)
Basophils Absolute: 0 10*3/uL (ref 0.0–0.1)
Basophils Relative: 0 %
Eosinophils Absolute: 0.2 10*3/uL (ref 0.0–0.5)
Eosinophils Relative: 4 %
HCT: 33.2 % — ABNORMAL LOW (ref 39.0–52.0)
Hemoglobin: 10.9 g/dL — ABNORMAL LOW (ref 13.0–17.0)
Immature Granulocytes: 0 %
Lymphocytes Relative: 15 %
Lymphs Abs: 0.8 10*3/uL (ref 0.7–4.0)
MCH: 31.5 pg (ref 26.0–34.0)
MCHC: 32.8 g/dL (ref 30.0–36.0)
MCV: 96 fL (ref 80.0–100.0)
Monocytes Absolute: 0.5 10*3/uL (ref 0.1–1.0)
Monocytes Relative: 9 %
Neutro Abs: 4.1 10*3/uL (ref 1.7–7.7)
Neutrophils Relative %: 72 %
Platelets: 194 10*3/uL (ref 150–400)
RBC: 3.46 MIL/uL — ABNORMAL LOW (ref 4.22–5.81)
RDW: 13.5 % (ref 11.5–15.5)
WBC: 5.7 10*3/uL (ref 4.0–10.5)
nRBC: 0 % (ref 0.0–0.2)

## 2023-06-05 LAB — COMPREHENSIVE METABOLIC PANEL
ALT: 18 U/L (ref 0–44)
AST: 22 U/L (ref 15–41)
Albumin: 3.2 g/dL — ABNORMAL LOW (ref 3.5–5.0)
Alkaline Phosphatase: 54 U/L (ref 38–126)
Anion gap: 7 (ref 5–15)
BUN: 14 mg/dL (ref 8–23)
CO2: 26 mmol/L (ref 22–32)
Calcium: 8.9 mg/dL (ref 8.9–10.3)
Chloride: 105 mmol/L (ref 98–111)
Creatinine, Ser: 0.6 mg/dL — ABNORMAL LOW (ref 0.61–1.24)
GFR, Estimated: >60 mL/min (ref >60)
Glucose, Bld: 106 mg/dL — ABNORMAL HIGH (ref 70–99)
Potassium: 3.4 mmol/L — ABNORMAL LOW (ref 3.5–5.1)
Sodium: 138 mmol/L (ref 135–145)
Total Bilirubin: 1 mg/dL (ref 0.0–1.2)
Total Protein: 5.6 g/dL — ABNORMAL LOW (ref 6.5–8.1)

## 2023-06-05 LAB — PROTEIN, URINE, RANDOM: Total Protein, Urine: 62 mg/dL

## 2023-06-05 MED ORDER — BEVACIZUMAB-ADCD CHEMO INJECTION 400 MG/16ML
5.0000 mg/kg | Freq: Once | INTRAVENOUS | Status: AC
  Administered 2023-06-05: 500 mg via INTRAVENOUS
  Filled 2023-06-05: qty 16

## 2023-06-05 MED ORDER — SODIUM CHLORIDE 0.9 % IV SOLN
Freq: Once | INTRAVENOUS | Status: AC
  Filled 2023-06-05: qty 250

## 2023-06-05 MED ORDER — LEUCOVORIN CALCIUM INJECTION 350 MG
400.0000 mg/m2 | Freq: Once | INTRAMUSCULAR | Status: AC
  Administered 2023-06-05: 860 mg via INTRAVENOUS
  Filled 2023-06-05: qty 43

## 2023-06-05 MED ORDER — FLUOROURACIL CHEMO INJECTION 5 GM/100ML
2400.0000 mg/m2 | INTRAVENOUS | Status: DC
  Administered 2023-06-05: 5000 mg via INTRAVENOUS
  Filled 2023-06-05: qty 100

## 2023-06-05 NOTE — Assessment & Plan Note (Signed)
decreased due to chemotherapy. continue to monitor.

## 2023-06-05 NOTE — Assessment & Plan Note (Signed)
Chemotherapy plan as listed above 

## 2023-06-05 NOTE — Assessment & Plan Note (Signed)
Slow growing small lung nodule, discussed with Dr. Rushie Chestnut.  Will hold off radiation and continue close monitor.

## 2023-06-05 NOTE — Patient Instructions (Signed)

## 2023-06-05 NOTE — Assessment & Plan Note (Signed)
CT images shows slight lung progression -Finished palliative radiation on 03/05/2023. Labs reviewed and discussed with patient Labs are reviewed and discussed with patient. Resume 5-FU and Bevavizumab today

## 2023-06-05 NOTE — Progress Notes (Signed)
Hematology/Oncology Progress note Telephone:([phone] Fax:([phone]    Chief Complaint: Jared Gutierrez is a 83 y.o.  male presents for follow-up of metastatic colon cancer   ASSESSMENT & PLAN:   Cancer Staging  Cancer of transverse colon [facility]) Staging form: Colon and Rectum, [name], [name], cM1) - Signed by Jared Patience, MD on 10/26/2021   Anemia due to chemotherapy decreased due to chemotherapy. continue to monitor.  Cancer of transverse colon [facility]) CT images shows slight lung progression -Finished palliative radiation on 03/05/2023. Labs reviewed and discussed with patient Labs are reviewed and discussed with patient. Resume 5-FU and Bevavizumab today  Encounter for antineoplastic chemotherapy Chemotherapy plan as listed above.   Lung nodules Slow growing small lung nodule, discussed with Dr. Rushie Chestnut.  Will hold off radiation and continue close monitor.   Follow-up 2 weeks  All questions were answered. The patient knows to call the clinic with any problems, questions or concerns.  Jared Patience, MD, PhD [facility] Hematology Oncology 06/05/2023     PERTINENT ONCOLOGY HISTORY Jared Gutierrez is a 83 y.o.[place] who has above oncology history reviewed by me today presented for follow up visit for management of Metastatic colon cancer. [name], patient switched care to me on 11/11/20 Extensive medical record review was performed by me  Oncology History  History of colon cancer, stage I  10/02/2014 Pathology Results   [id number] colonoscopy pathology A. CECAL POLYPS; HOT SNARE AND COLD BIOPSY:  - TUBULAR ADENOMA, MULTIPLE FRAGMENTS.  - NEGATIVE FOR HIGH-GRADE DYSPLASIA AND MALIGNANCY.   B. COLON POLYP, TRANSVERSE; COLD BIOPSY:  - TUBULAR ADENOMA.  - NEGATIVE FOR HIGH-GRADE DYSPLASIA AND MALIGNANCY.     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  Tumor extended into the submucosa. Margins were negative. + lymphovascular invasion. 14 lymph nodes were negative. Pathologic stage was T1 N0.  TRANSVERSE COLON, RESECTION:  - INVASIVE ADENOCARCINOMA ARISING IN A 4.6 CM TUBULOVILLOUS  ADENOMA WITH HIGH GRADE DYSPLASIA (MALIGNANT POLYP).  - SEE SUMMARY BELOW.  [name]  ONCOLOGY SUMMARY: COLON AND RECTUM, RESECTION, AJCC 7TH EDITION  Specimen: transverse colon  Procedure: transverse colon resection  Tumor site: splenic flexure  Tumor size: 1.0 cm (invasive component)  Macroscopic tumor perforation: not specified  Histologic type: invasive adenocarcinoma  Histologic grade: moderately differentiated  Microscopic tumor extension: into submucosa  Margins:       Proximal margin: negative       Distal margin: negative       Circumferential (radial) or mesenteric margin: negative       If all margins uninvolved by invasive carcinoma:       Distance of  invasive carcinoma from closest margin: 7.5 cm  to distal margin  Treatment effect: not applicable  Lymph-vascular invasion: present  Perineural invasion: not identified  Tumor deposits (discontinuous extramural extension): not  identified  Pathologic staging:        Primary tumor: pT1       Regional lymph nodes: pN0            Number of nodes examined: 14            Number of nodes involved: 0    04/02/2019 Progression   04/02/2019  PET scan  limited evealed a 2.4 x 2.3 cm (SUV 11) hypermetabolic soft tissue density caudal and anterior to the pancreatic neck favored to represent isolated peritoneal or nodal metastasis in the setting of prior transverse colonic resection (expected primary drainage). Although this was immediately adjacent to the pancreas, a fat plane was maintained, arguing strongly against a pancreatic primary. Otherwise, there was no evidence of hypermetabolic metastasis. 04/17/2019 EUS on  revealed a normal esophagus, stomach, duodenum, and pancreas.  There was a 2.4 x 2.4 cm irregular mass in the retroperitoneum adjacent to, but not involving the pancreatic neck.  FNA and core needle biopsy were performed.  Pathology revealed adenocarcinoma compatible with a metastatic lesion of colorectal origin.  Tumor cells were positive for CK20 and CDX2 and negative for CK7.  NGS: [employer] on 05/15/2019 revealed + [id number] and TP53.  Negative results included BRAF V600E, Her2, NRAS, NTRK, PD-L1 (<1%), and TMB 8.7/Mb (intermediate).  MMR testing from his colon resection on 10/16/2013 was intact with a low probability of MSI-H.   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  He received Neulasta after cycle #4 and #5 secondary to progressive leukopenia.  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  He received a truncated course of FOLFOX with cycle #11 secondary to oxaliplatin reaction.   01/14/2020 Imaging    PET revealed an interval decrease in size and FDG uptake (2.4 x 2.3 cm with SUV 11 to 2.4 x 1.7 cm with SUV 4.09) associated with the previously referenced soft tissue density caudal and anterior to  the pancreatic neck suggesting treatment response. There were no new sites of FDG avid tumor.   08/31/2020 Imaging   08/31/2020 Abdomen and pelvis CT revealed increased size of the index soft tissue lesion inferior and anterior to the pancreatic neck (2.9 x 1.8 cm to 3.5 x 2.9 cm). There were similar prominent retroperitoneal lymph nodes without adenopathy by size criteria. There was no new or enlarging abdominal or pelvic lymph nodes. There were no new interval findings. There was similar circumferential wall thickening of a nondistended urinary bladder, which likely accentuated wall thickening There was hepatic steatosis and aortic atherosclerosis.   11/03/2020, PET showed recurrent peritoneal metastasis in the upper abdomen adjacent to the pancreas.  The lesion is 3.8 x 2.9 cm with SUV of 11.3. No evidence of metastatic peritoneal disease elsewhere in the abdomen pelvis.  No evidence of distant metastasis.   11/03/2020 Imaging   PET scan showed recurrent peritoneal metastasis near pancreas. Given that he has no other distant metastasis. Discussed with radiation oncology. Repeat SBRT may be considered if he does not respond well to systemic chemotherapy.     11/26/2020 -  Chemotherapy   5-FU and bevacizumab.   02/17/2021 Imaging   02/17/2021 CT  abdomen pelvis showed partial response. Mild decrease of peritoneal lesion.  Proceed with 5-FU and bevacizumab today.  Given that he is tolerating current regimen with good life quality, partial response.  Shared decision was made to hold off adding additional chemotherapy agents.   06/08/2021, CT chest abdomen pelvis with contrast showed soft tissue mass at the base of mesentery inferior to the pancreatic neck is unchanged in size.  No progressive disease was identified.   09/07/2021, CT chest abdomen pelvis with contrast showed no substantial changes in size of the mesenteric mass, 2.6 cm.  No suspicious new lesions.  Chronic findings as detailed in the  imaging report.   12/05/2021 Imaging   PET scan showed soft tissue mesenteric mass is unchanged in size.  Decreased FDG uptake compared to activity on November 03, 2020.  Fever treated disease.  No new metastatic disease.   03/21/2022 Imaging   CT chest abdomen pelvis  1. Similar size of the peripancreatic presumed peritoneal implant compared to 09/07/2021. 2. No new or progressive disease. 3. Incidental findings, including: Coronary artery atherosclerosis.Aortic Atherosclerosis (ICD10-I70.0). Prostatomegaly with chronic bladder wall thickening, suggesting outlet obstruction.     07/11/2022 Imaging   CT chest abdomen pelvis with contrast showed 1. New 6 mm nodule in the right upper lobe and 2 mm nodule in the left upper lobe. Although small these are concerning. Recommend short follow-up in 3 months 2. Stable low-attenuation lesion along the midbody of the pancreas. Continued attention on follow-up. No new peritoneal mass lesions or ascites. 3. Fatty liver infiltration 4. Colonic surgical changes   10/03/2022 Imaging   CT chest abdomen pelvis w contrast showed 1. Slight interval enlargement and cavitation of a nodule in the right upper lobe, measuring 0.6 cm, previously 0.5 cm. Slight interval enlargement of a left upper lobe nodule measuring 0.3 cm,previously 0.2 cm. Findings are concerning for slowly enlarging small pulmonary metastases. 2. No significant change in a lobulated soft tissue mass within the central small bowel mesentery abutting the inferior aspect of the pancreas, consistent with a stable, treated metastasis. 3. No other evidence of lymphadenopathy or metastatic disease in the chest, abdomen, or pelvis. 4. Prostatomegaly with diffuse wall thickening of the relatively decompressed urinary bladder, likely secondary to chronic outlet obstruction. 5. Unchanged fibrotic scarring and bronchiectasis of the bilateral lung bases. This could be further assessed by pulmonary referral and  dedicated interstitial lung disease protocol CT examination of the chest if and when clinically appropriate in the setting of known metastatic malignancy. Aortic Atherosclerosis    12/27/2022 Imaging   CT chest abdomen pelvis w contrast showed 1. Continued slight interval enlargement of a cavitary nodule in the right upper lobe measuring 0.7 x 0.6 cm, as well as a nodule in the peripheral left upper lobe measuring 0.5 cm. These are highly suspicious for slowly enlarging pulmonary metastases but remain below size threshold for reliable PET-CT characterization. 2. Unchanged, lobulated hypodense soft tissue mass in the central small bowel mesentery, consistent with treated metastatic disease. 3. Status post transverse colon resection and reanastomosis. 4. Thickening of the distal sigmoid colon and rectum, consistent with nonspecific infectious or inflammatory colitis. 5. Prostatomegaly with diffuse thickening of the urinary bladder wall, likely secondary to chronic outlet obstruction.   Aortic Atherosclerosis (ICD10-I70.0).   01/09/2023 Imaging   PET scan showed Status post transverse colectomy. 4.2 cm soft tissue mass in the central abdominal mesentery, suspicious for residual/recurrent nodal metastasis. Additional 8 mm short axis left para-aortic nodal metastasis. 7 mm  cavitary right upper lobe nodule and 5 mm left upper lobe nodule, both beneath the size threshold for PET sensitivity, but suspicious for small pulmonary metastases.   01/30/2023 -  Radiation Therapy   Palliative radiation to mesenteric soft tissue mass.    05/07/2023 Imaging   CT chest abdomen pelvis w contrast showed  Focal nodular area along the inferior margin of the midbody of the pancreas similar to previous. There is also some abnormal soft tissue extending into the porta hepatis encasing the hepatic artery which compared to previous is also slightly increased.   New small soft tissue nodule adjacent to the SMA  proximally.   Previous hypermetabolic node left para-aortic is similar in size to previous.   Stable bilateral small lung nodules. Right lesions again cavitary. No new lung nodules.   Stable surgical changes.  Chest port.     Other medical problems Chronic lower extremity edema. 12/24/2018, right lower extremity duplex negative for DVT.  Small right Baker's cyst. 08/16/2019, bilateral lower extremity duplex showed no DVT. 08/16/2019 - 08/17/2019 with right lower extremity cellulitis.  He was unable to bear weight.  He was treated with IV fluids, NSAIDs, colchicine, and broad antibiotics (vancomycin and Cefepime).  He was discharged on indomethacin x 5 days and Keflex 500 mg TID x 5 days.  10/05/2020, colonoscopy showed internal hemorrhoids.  Otherwise normal examination.  INTERVAL HISTORY BRICESON SKIDGEL is a 83 y.o. male who has above history reviewed by me today presents for follow up visit for management of recurrent metastatic colon cancer Patient was accompanied by wife Denies fever, chills, nausea, vomiting, diarrhea, chest pain, shortness of breath, abdominal pain,  Appetite is fair.    Review of Systems  Constitutional:  Negative for appetite change, chills, diaphoresis, fever and unexpected weight change.  HENT:   Negative for hearing loss, nosebleeds, sore throat and tinnitus.   Respiratory:  Negative for cough, hemoptysis and shortness of breath.   Cardiovascular:  Negative for chest pain and palpitations.  Gastrointestinal:  Negative for abdominal pain, blood in stool, constipation, diarrhea, nausea and vomiting.  Genitourinary:  Negative for dysuria, frequency and hematuria.   Musculoskeletal:  Positive for arthralgias. Negative for back pain, myalgias and neck pain.  Skin:  Negative for itching and rash.  Neurological:  Positive for numbness. Negative for dizziness and headaches.  Hematological:  Does not bruise/bleed easily.  Psychiatric/Behavioral:  Negative for  depression. The patient is not nervous/anxious.       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   Chicken pox    Colon cancer (HCC)    History of kidney stones    Hypertension    Lipoma of colon    Nephrolithiasis    Nephrolithiasis    Obesity    Shingles    Tubular adenoma of colon    multiple fragments    Past Surgical History:  Procedure Laterality Date   COLON SURGERY     COLONOSCOPY N/A 10/02/2014   Procedure: COLONOSCOPY;  Surgeon: Elnita Maxwell, MD;  Location: [facility] ENDOSCOPY;  Service: Endoscopy;  Laterality: N/A;   COLONOSCOPY WITH PROPOFOL N/A 02/16/2017   Procedure: COLONOSCOPY WITH PROPOFOL;  Surgeon: Scot Jun, MD;  Location[facility] ENDOSCOPY;  Service: Endoscopy;  Laterality: N/A;   COLONOSCOPY WITH PROPOFOL N/A 10/05/2020   Procedure: COLONOSCOPY WITH PROPOFOL;  Surgeon: Regis Bill, MD;  Location: [facility] ENDOSCOPY;  Service: Endoscopy;  Laterality: N/A;   EUS N/A 04/17/2019   Procedure: FULL UPPER ENDOSCOPIC ULTRASOUND (EUS) RADIAL;  Surgeon: Bearl Mulberry, MD;  Location: [facility] ENDOSCOPY;  Service: Gastroenterology;  Laterality: N/A;   KIDNEY STONE SURGERY     PARTIAL COLECTOMY  10/17/2013   PORTACATH PLACEMENT Right 06/13/2019   Procedure: INSERTION PORT-A-CATH;  Surgeon: Sung Amabile, DO;  Location: [facility] ORS;  Service: General;  Laterality: Right;    Family History  Problem Relation Age of Onset   Cancer Mother    Breast cancer Mother    COPD Father     Social History:  reports that he has never smoked. He has never used smokeless tobacco. He reports current alcohol use. He reports that he does not use drugs.    Allergies: No Known Allergies  Current Medications: Current Outpatient Medications  Medication Sig Dispense Refill   acetaminophen (TYLENOL) 500 MG tablet Take 500 mg by mouth every 6 (six) hours as needed.     amLODipine  (NORVASC) 2.5 MG tablet Take 2.5 mg by mouth daily.     aspirin EC 81 MG tablet Take 81 mg by mouth daily.     Calcium Carbonate (CALCIUM 600 PO) Take 1 tablet by mouth 2 (two) times daily.     Cholecalciferol (VITAMIN D) 125 MCG (5000 UT) CAPS Take 5,000 mg by mouth.     docusate sodium (COLACE) 100 MG capsule Take 1 capsule (100 mg total) by mouth 2 (two) times daily as needed for mild constipation or moderate constipation. 60 capsule 3   HYDROcodone-acetaminophen (NORCO/VICODIN) 5-325 MG tablet Take 1 tablet by mouth every 6 (six) hours as needed for moderate pain (up to 3 doses for moderate pain.).     lidocaine-prilocaine (EMLA) cream APPLY TO AFFECTED AREA ONCE AS DIRECTED 30 g 3   loratadine (CLARITIN) 10 MG tablet Take 10 mg by mouth daily.     megestrol (MEGACE) 40 MG tablet TAKE 2 TABLETS BY MOUTH 2 TIMES DAILY. 360 tablet 1   olmesartan (BENICAR) 20 MG tablet Take 1 tablet by mouth daily.     ondansetron (ZOFRAN) 8 MG tablet Take 1 tablet (8 mg total) by mouth 2 (two) times daily as needed for refractory nausea / vomiting. Start on day 3 after chemotherapy. 60 tablet 1   potassium chloride SA (KLOR-CON M20) 20 MEQ tablet Take 1 tablet (20 mEq total) by mouth daily. 180 tablet 0   Probiotic Product (PROBIOTIC DAILY PO) Take 1 tablet by mouth daily.     loperamide (IMODIUM) 2 MG capsule Take 1 capsule (2 mg total) by mouth See admin instructions. Take 2 tablets after first loose stool,  then 1 tablet  after each loose stool; maximum: 8 tablets /day (Patient not taking: Reported on 02/27/2023) 60 capsule 0   prochlorperazine (COMPAZINE) 10 MG tablet Take 1 tablet (10 mg total) by mouth every 6 (six) hours as needed (NAUSEA). (Patient not taking: Reported on 02/27/2023) 30 tablet 1   senna-docusate (SENNA S) 8.6-50 MG tablet Take 2 tablets by mouth daily. (Patient not taking: Reported on 03/27/2023) 60 tablet 3   No current facility-administered medications for this visit.    Facility-Administered Medications Ordered in Other Visits  Medication Dose Route Frequency Provider Last Rate Last Admin   0.9 %  sodium chloride infusion   Intravenous Once Corcoran, Melissa C, MD       0.9 %  sodium chloride infusion   Intravenous Continuous Corcoran, Melissa C,  MD 10 mL/hr at 12/31/19 1000 New Bag at 10/05/20 1045   heparin lock flush 100 unit/mL  500 Units Intravenous Once Corcoran, Melissa C, MD       sodium chloride flush (NS) 0.9 % injection 10 mL  10 mL Intracatheter PRN Jared Patience, MD   10 mL at 02/01/23 1341    Performance status (ECOG): 1  Vitals Blood pressure (!) 146/85, pulse 65, temperature 97.6 F (36.4 C), temperature source Tympanic, resp. rate 18, weight 217 lb 3.2 oz (98.5 kg), SpO2 100%.   Physical Exam Constitutional:      General: He is not in acute distress.    Appearance: He is obese. He is not diaphoretic.  HENT:     Head: Normocephalic and atraumatic.  Eyes:     General: No scleral icterus. Cardiovascular:     Rate and Rhythm: Normal rate and regular rhythm.     Heart sounds: No murmur heard. Pulmonary:     Effort: Pulmonary effort is normal. No respiratory distress.     Breath sounds: No wheezing.     Comments: Decreased breath sound bilaterally.  Abdominal:     General: Bowel sounds are normal. There is no distension.     Palpations: Abdomen is soft.  Musculoskeletal:        General: Normal range of motion.     Cervical back: Normal range of motion and neck supple.     Comments: Trace edema bilaterally  Skin:    General: Skin is warm and dry.     Findings: No erythema.  Neurological:     Mental Status: He is alert and oriented to person, place, and time. Mental status is at baseline.     Motor: No abnormal muscle tone.  Psychiatric:        Mood and Affect: Mood and affect normal.     Labs were reviewed by me.     Latest Ref Rng & Units 06/05/2023    7:59 AM 05/22/2023    8:31 AM 04/11/2023    5:56 PM  CBC  WBC 4.0 -  10.5 K/uL 5.7  4.9  5.7   Hemoglobin 13.0 - 17.0 g/dL 09.8  11.9  14.7   Hematocrit 39.0 - 52.0 % 33.2  31.3  34.0   Platelets 150 - 400 K/uL 194  204  230       Latest Ref Rng & Units 05/22/2023    8:31 AM 04/11/2023    5:56 PM 03/27/2023   10:17 AM  CMP  Glucose 70 - 99 mg/dL 98  829  562   BUN 8 - 23 mg/dL 11  15  15    Creatinine 0.61 - 1.24 mg/dL 1.30  8.65  7.84   Sodium 135 - 145 mmol/L 140  132  134   Potassium 3.5 - 5.1 mmol/L 3.4  4.5  3.8   Chloride 98 - 111 mmol/L 107  105  107   CO2 22 - 32 mmol/L 24  21  21    Calcium 8.9 - 10.3 mg/dL 8.6  9.2  8.8   Total Protein 6.5 - 8.1 g/dL 5.6  6.6  6.0   Total Bilirubin 0.0 - 1.2 mg/dL 1.0  1.0  1.1   Alkaline Phos 38 - 126 U/L 60  42  40   AST 15 - 41 U/L 36  25  28   ALT 0 - 44 U/L 32  15  16

## 2023-06-07 ENCOUNTER — Inpatient Hospital Stay: Payer: Medicare Other

## 2023-06-14 ENCOUNTER — Encounter: Payer: Self-pay | Admitting: Oncology

## 2023-06-19 ENCOUNTER — Inpatient Hospital Stay: Payer: Medicare Other

## 2023-06-19 ENCOUNTER — Encounter: Payer: Self-pay | Admitting: Oncology

## 2023-06-19 ENCOUNTER — Inpatient Hospital Stay: Payer: Medicare Other | Attending: Oncology

## 2023-06-19 ENCOUNTER — Inpatient Hospital Stay (HOSPITAL_BASED_OUTPATIENT_CLINIC_OR_DEPARTMENT_OTHER): Payer: Medicare Other | Admitting: Oncology

## 2023-06-19 VITALS — BP 125/71 | HR 76 | Temp 98.2°F | Resp 18 | Wt 207.4 lb

## 2023-06-19 DIAGNOSIS — T451X5A Adverse effect of antineoplastic and immunosuppressive drugs, initial encounter: Secondary | ICD-10-CM

## 2023-06-19 DIAGNOSIS — K648 Other hemorrhoids: Secondary | ICD-10-CM | POA: Insufficient documentation

## 2023-06-19 DIAGNOSIS — Z5111 Encounter for antineoplastic chemotherapy: Secondary | ICD-10-CM | POA: Diagnosis not present

## 2023-06-19 DIAGNOSIS — Z5112 Encounter for antineoplastic immunotherapy: Secondary | ICD-10-CM | POA: Diagnosis present

## 2023-06-19 DIAGNOSIS — R918 Other nonspecific abnormal finding of lung field: Secondary | ICD-10-CM | POA: Diagnosis not present

## 2023-06-19 DIAGNOSIS — C184 Malignant neoplasm of transverse colon: Secondary | ICD-10-CM

## 2023-06-19 DIAGNOSIS — Z860100 Personal history of colon polyps, unspecified: Secondary | ICD-10-CM | POA: Insufficient documentation

## 2023-06-19 DIAGNOSIS — Z803 Family history of malignant neoplasm of breast: Secondary | ICD-10-CM | POA: Diagnosis not present

## 2023-06-19 DIAGNOSIS — Z452 Encounter for adjustment and management of vascular access device: Secondary | ICD-10-CM | POA: Diagnosis not present

## 2023-06-19 DIAGNOSIS — Z7982 Long term (current) use of aspirin: Secondary | ICD-10-CM | POA: Diagnosis not present

## 2023-06-19 DIAGNOSIS — Z836 Family history of other diseases of the respiratory system: Secondary | ICD-10-CM | POA: Diagnosis not present

## 2023-06-19 DIAGNOSIS — D6481 Anemia due to antineoplastic chemotherapy: Secondary | ICD-10-CM

## 2023-06-19 DIAGNOSIS — Z79899 Other long term (current) drug therapy: Secondary | ICD-10-CM | POA: Diagnosis not present

## 2023-06-19 DIAGNOSIS — C786 Secondary malignant neoplasm of retroperitoneum and peritoneum: Secondary | ICD-10-CM

## 2023-06-19 DIAGNOSIS — T451X5D Adverse effect of antineoplastic and immunosuppressive drugs, subsequent encounter: Secondary | ICD-10-CM | POA: Diagnosis not present

## 2023-06-19 DIAGNOSIS — Z85038 Personal history of other malignant neoplasm of large intestine: Secondary | ICD-10-CM | POA: Diagnosis not present

## 2023-06-19 DIAGNOSIS — Z809 Family history of malignant neoplasm, unspecified: Secondary | ICD-10-CM | POA: Insufficient documentation

## 2023-06-19 LAB — CBC WITH DIFFERENTIAL/PLATELET
Abs Immature Granulocytes: 0.01 10*3/uL (ref 0.00–0.07)
Basophils Absolute: 0 10*3/uL (ref 0.0–0.1)
Basophils Relative: 1 %
Eosinophils Absolute: 0.2 10*3/uL (ref 0.0–0.5)
Eosinophils Relative: 4 %
HCT: 35.2 % — ABNORMAL LOW (ref 39.0–52.0)
Hemoglobin: 11.5 g/dL — ABNORMAL LOW (ref 13.0–17.0)
Immature Granulocytes: 0 %
Lymphocytes Relative: 24 %
Lymphs Abs: 1 10*3/uL (ref 0.7–4.0)
MCH: 31.7 pg (ref 26.0–34.0)
MCHC: 32.7 g/dL (ref 30.0–36.0)
MCV: 97 fL (ref 80.0–100.0)
Monocytes Absolute: 0.4 10*3/uL (ref 0.1–1.0)
Monocytes Relative: 11 %
Neutro Abs: 2.4 10*3/uL (ref 1.7–7.7)
Neutrophils Relative %: 60 %
Platelets: 150 10*3/uL (ref 150–400)
RBC: 3.63 MIL/uL — ABNORMAL LOW (ref 4.22–5.81)
RDW: 13.5 % (ref 11.5–15.5)
WBC: 4 10*3/uL (ref 4.0–10.5)
nRBC: 0 % (ref 0.0–0.2)

## 2023-06-19 LAB — COMPREHENSIVE METABOLIC PANEL
ALT: 17 U/L (ref 0–44)
AST: 27 U/L (ref 15–41)
Albumin: 3.4 g/dL — ABNORMAL LOW (ref 3.5–5.0)
Alkaline Phosphatase: 51 U/L (ref 38–126)
Anion gap: 8 (ref 5–15)
BUN: 10 mg/dL (ref 8–23)
CO2: 25 mmol/L (ref 22–32)
Calcium: 8.8 mg/dL — ABNORMAL LOW (ref 8.9–10.3)
Chloride: 103 mmol/L (ref 98–111)
Creatinine, Ser: 0.65 mg/dL (ref 0.61–1.24)
GFR, Estimated: >60 mL/min (ref >60)
Glucose, Bld: 105 mg/dL — ABNORMAL HIGH (ref 70–99)
Potassium: 3.6 mmol/L (ref 3.5–5.1)
Sodium: 136 mmol/L (ref 135–145)
Total Bilirubin: 1.2 mg/dL (ref 0.0–1.2)
Total Protein: 6 g/dL — ABNORMAL LOW (ref 6.5–8.1)

## 2023-06-19 LAB — PROTEIN, URINE, RANDOM: Total Protein, Urine: 87 mg/dL

## 2023-06-19 MED ORDER — SODIUM CHLORIDE 0.9 % IV SOLN
400.0000 mg/m2 | Freq: Once | INTRAVENOUS | Status: AC
  Administered 2023-06-19: 860 mg via INTRAVENOUS
  Filled 2023-06-19: qty 43

## 2023-06-19 MED ORDER — SODIUM CHLORIDE 0.9 % IV SOLN
2400.0000 mg/m2 | INTRAVENOUS | Status: DC
  Administered 2023-06-19: 5000 mg via INTRAVENOUS
  Filled 2023-06-19: qty 100

## 2023-06-19 MED ORDER — SODIUM CHLORIDE 0.9 % IV SOLN
5.0000 mg/kg | Freq: Once | INTRAVENOUS | Status: AC
  Administered 2023-06-19: 500 mg via INTRAVENOUS
  Filled 2023-06-19: qty 16

## 2023-06-19 MED ORDER — SODIUM CHLORIDE 0.9 % IV SOLN
Freq: Once | INTRAVENOUS | Status: AC
  Filled 2023-06-19: qty 250

## 2023-06-19 NOTE — Progress Notes (Signed)
Hematology/Oncology Progress note [phone] [phone]    Chief Complaint: Jared Gutierrez is a 83 y.o.  male presents for follow-up of metastatic colon cancer   ASSESSMENT & PLAN:   Cancer Staging  Cancer of transverse colon [facility]) Staging form: Colon and Rectum, [name], [name], [name], MD on 10/26/2021   Cancer of transverse colon [facility]) CT images shows slight lung progression -Finished palliative radiation on 03/05/2023. Labs reviewed and discussed with patient Labs are reviewed and discussed with patient. Proceed with 5-FU and Bevavizumab today  Anemia due to chemotherapy Stable continue to monitor.  Encounter for antineoplastic chemotherapy Chemotherapy plan as listed above.    Follow-up 2 weeks  All questions were answered. The patient knows to call the clinic with any problems, questions or concerns.  [name], MD, PhD [facility] Hematology Oncology 06/19/2023     PERTINENT ONCOLOGY HISTORY Jared Gutierrez is a 83 y.o.[place] who has above oncology history reviewed by me today presented for follow up visit for management of Metastatic colon cancer. [name], patient switched care to me on 11/11/20 Extensive medical record review was performed by me  Oncology History  History of colon cancer, stage I  10/02/2014 Pathology Results   [id number] colonoscopy pathology A. CECAL POLYPS; HOT SNARE AND COLD BIOPSY:  - TUBULAR ADENOMA, MULTIPLE FRAGMENTS.  - NEGATIVE FOR HIGH-GRADE DYSPLASIA AND MALIGNANCY.   B. COLON POLYP, TRANSVERSE; COLD BIOPSY:  - TUBULAR ADENOMA.  - NEGATIVE FOR HIGH-GRADE DYSPLASIA AND MALIGNANCY.     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  Tumor extended into the submucosa. Margins were negative. + lymphovascular invasion. 14 lymph nodes were negative. Pathologic stage was T1 N0.  TRANSVERSE COLON, RESECTION:  - INVASIVE ADENOCARCINOMA ARISING IN A 4.6 CM TUBULOVILLOUS  ADENOMA WITH HIGH GRADE DYSPLASIA (MALIGNANT POLYP).  - SEE SUMMARY BELOW.  [name]  ONCOLOGY SUMMARY: COLON AND RECTUM, RESECTION, AJCC 7TH EDITION  Specimen: transverse colon  Procedure: transverse colon resection  Tumor site: splenic flexure  Tumor size: 1.0 cm (invasive component)  Macroscopic tumor perforation: not specified  Histologic type: invasive adenocarcinoma  Histologic grade: moderately differentiated  Microscopic tumor extension: into submucosa  Margins:       Proximal margin: negative       Distal margin: negative       Circumferential (radial) or mesenteric margin: negative       If all margins uninvolved by invasive carcinoma:       Distance of invasive carcinoma from closest margin: 7.5 cm  to distal margin  Treatment effect: not applicable  Lymph-vascular invasion: present  Perineural invasion:  not identified  Tumor deposits (discontinuous extramural extension): not  identified  Pathologic staging:       Primary tumor: pT1       Regional lymph nodes: pN0            Number of nodes examined: 14            Number of nodes involved: 0    04/02/2019  Progression   04/02/2019  PET scan  limited evealed a 2.4 x 2.3 cm (SUV 11) hypermetabolic soft tissue density caudal and anterior to the pancreatic neck favored to represent isolated peritoneal or nodal metastasis in the setting of prior transverse colonic resection (expected primary drainage). Although this was immediately adjacent to the pancreas, a fat plane was maintained, arguing strongly against a pancreatic primary. Otherwise, there was no evidence of hypermetabolic metastasis. 04/17/2019 EUS on  revealed a normal esophagus, stomach, duodenum, and pancreas.  There was a 2.4 x 2.4 cm irregular mass in the retroperitoneum adjacent to, but not involving the pancreatic neck.  FNA and core needle biopsy were performed.  Pathology revealed adenocarcinoma compatible with a metastatic lesion of colorectal origin.  Tumor cells were positive for CK20 and CDX2 and negative for CK7.  NGS: [employer] on 05/15/2019 revealed + [id number] and TP53.  Negative results included BRAF V600E, Her2, NRAS, NTRK, PD-L1 (<1%), and TMB 8.7/Mb (intermediate).  MMR testing from his colon resection on 10/16/2013 was intact with a low probability of MSI-H.   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  He received Neulasta after cycle #4 and #5 secondary to progressive leukopenia.  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  He received a truncated course of FOLFOX with cycle #11 secondary to oxaliplatin reaction.   01/14/2020 Imaging    PET revealed an interval decrease in size and FDG uptake (2.4 x 2.3 cm with SUV 11 to 2.4 x 1.7 cm with SUV 4.09) associated with the previously referenced soft tissue density caudal and anterior to the pancreatic neck suggesting treatment response. There were no new sites of FDG avid tumor.   08/31/2020 Imaging   08/31/2020 Abdomen and pelvis  CT revealed increased size of the index soft tissue lesion inferior and anterior to the pancreatic neck (2.9 x 1.8 cm to 3.5 x 2.9 cm). There were similar prominent retroperitoneal lymph nodes without adenopathy by size criteria. There was no new or enlarging abdominal or pelvic lymph nodes. There were no new interval findings. There was similar circumferential wall thickening of a nondistended urinary bladder, which likely accentuated wall thickening There was hepatic steatosis and aortic atherosclerosis.   11/03/2020, PET showed recurrent peritoneal metastasis in the upper abdomen adjacent to the pancreas.  The lesion is 3.8 x 2.9 cm with SUV of 11.3. No evidence of metastatic peritoneal disease elsewhere in the abdomen pelvis.  No evidence of distant metastasis.   11/03/2020 Imaging   PET scan showed recurrent peritoneal metastasis near pancreas. Given that he has no other distant metastasis. Discussed with radiation oncology. Repeat SBRT may be considered if he does not respond well to systemic chemotherapy.     11/26/2020 -  Chemotherapy   5-FU and bevacizumab.   02/17/2021 Imaging   02/17/2021 CT abdomen pelvis showed partial response. Mild decrease of peritoneal lesion.  Proceed with 5-FU and bevacizumab today.  Given that he is tolerating  current regimen with good life quality, partial response.  Shared decision was made to hold off adding additional chemotherapy agents.   06/08/2021, CT chest abdomen pelvis with contrast showed soft tissue mass at the base of mesentery inferior to the pancreatic neck is unchanged in size.  No progressive disease was identified.   09/07/2021, CT chest abdomen pelvis with contrast showed no substantial changes in size of the mesenteric mass, 2.6 cm.  No suspicious new lesions.  Chronic findings as detailed in the imaging report.   12/05/2021 Imaging   PET scan showed soft tissue mesenteric mass is unchanged in size.  Decreased FDG uptake compared to activity on  November 03, 2020.  Fever treated disease.  No new metastatic disease.   03/21/2022 Imaging   CT chest abdomen pelvis  1. Similar size of the peripancreatic presumed peritoneal implant compared to 09/07/2021. 2. No new or progressive disease. 3. Incidental findings, including: Coronary artery atherosclerosis.Aortic Atherosclerosis (ICD10-I70.0). Prostatomegaly with chronic bladder wall thickening, suggesting outlet obstruction.     07/11/2022 Imaging   CT chest abdomen pelvis with contrast showed 1. New 6 mm nodule in the right upper lobe and 2 mm nodule in the left upper lobe. Although small these are concerning. Recommend short follow-up in 3 months 2. Stable low-attenuation lesion along the midbody of the pancreas. Continued attention on follow-up. No new peritoneal mass lesions or ascites. 3. Fatty liver infiltration 4. Colonic surgical changes   10/03/2022 Imaging   CT chest abdomen pelvis w contrast showed 1. Slight interval enlargement and cavitation of a nodule in the right upper lobe, measuring 0.6 cm, previously 0.5 cm. Slight interval enlargement of a left upper lobe nodule measuring 0.3 cm,previously 0.2 cm. Findings are concerning for slowly enlarging small pulmonary metastases. 2. No significant change in a lobulated soft tissue mass within the central small bowel mesentery abutting the inferior aspect of the pancreas, consistent with a stable, treated metastasis. 3. No other evidence of lymphadenopathy or metastatic disease in the chest, abdomen, or pelvis. 4. Prostatomegaly with diffuse wall thickening of the relatively decompressed urinary bladder, likely secondary to chronic outlet obstruction. 5. Unchanged fibrotic scarring and bronchiectasis of the bilateral lung bases. This could be further assessed by pulmonary referral and dedicated interstitial lung disease protocol CT examination of the chest if and when clinically appropriate in the setting of known metastatic  malignancy. Aortic Atherosclerosis    12/27/2022 Imaging   CT chest abdomen pelvis w contrast showed 1. Continued slight interval enlargement of a cavitary nodule in the right upper lobe measuring 0.7 x 0.6 cm, as well as a nodule in the peripheral left upper lobe measuring 0.5 cm. These are highly suspicious for slowly enlarging pulmonary metastases but remain below size threshold for reliable PET-CT characterization. 2. Unchanged, lobulated hypodense soft tissue mass in the central small bowel mesentery, consistent with treated metastatic disease. 3. Status post transverse colon resection and reanastomosis. 4. Thickening of the distal sigmoid colon and rectum, consistent with nonspecific infectious or inflammatory colitis. 5. Prostatomegaly with diffuse thickening of the urinary bladder wall, likely secondary to chronic outlet obstruction.   Aortic Atherosclerosis (ICD10-I70.0).   01/09/2023 Imaging   PET scan showed Status post transverse colectomy. 4.2 cm soft tissue mass in the central abdominal mesentery, suspicious for residual/recurrent nodal metastasis. Additional 8 mm short axis left para-aortic nodal metastasis. 7 mm cavitary right upper lobe nodule and 5 mm left upper lobe nodule, both beneath the size threshold for PET sensitivity, but suspicious for  small pulmonary metastases.   01/30/2023 -  Radiation Therapy   Palliative radiation to mesenteric soft tissue mass.    05/07/2023 Imaging   CT chest abdomen pelvis w contrast showed  Focal nodular area along the inferior margin of the midbody of the pancreas similar to previous. There is also some abnormal soft tissue extending into the porta hepatis encasing the hepatic artery which compared to previous is also slightly increased.   New small soft tissue nodule adjacent to the SMA proximally.   Previous hypermetabolic node left para-aortic is similar in size to previous.   Stable bilateral small lung nodules. Right  lesions again cavitary. No new lung nodules.   Stable surgical changes.  Chest port.     Other medical problems Chronic lower extremity edema. 12/24/2018, right lower extremity duplex negative for DVT.  Small right Baker's cyst. 08/16/2019, bilateral lower extremity duplex showed no DVT. 08/16/2019 - 08/17/2019 with right lower extremity cellulitis.  He was unable to bear weight.  He was treated with IV fluids, NSAIDs, colchicine, and broad antibiotics (vancomycin and Cefepime).  He was discharged on indomethacin x 5 days and Keflex 500 mg TID x 5 days.  10/05/2020, colonoscopy showed internal hemorrhoids.  Otherwise normal examination.  INTERVAL HISTORY Jared Gutierrez is a 83 y.o. male who has above history reviewed by me today presents for follow up visit for management of recurrent metastatic colon cancer Patient was accompanied by wife Denies fever, chills, nausea, vomiting, diarrhea, chest pain, shortness of breath, abdominal pain,  He tolerated last chemotherapy well.  Appetite is fair.    Review of Systems  Constitutional:  Negative for appetite change, chills, diaphoresis, fever and unexpected weight change.  HENT:   Negative for hearing loss, nosebleeds, sore throat and tinnitus.   Respiratory:  Negative for cough, hemoptysis and shortness of breath.   Cardiovascular:  Negative for chest pain and palpitations.  Gastrointestinal:  Negative for abdominal pain, blood in stool, constipation, diarrhea, nausea and vomiting.  Genitourinary:  Negative for dysuria, frequency and hematuria.   Musculoskeletal:  Positive for arthralgias. Negative for back pain, myalgias and neck pain.  Skin:  Negative for itching and rash.  Neurological:  Positive for numbness. Negative for dizziness and headaches.  Hematological:  Does not bruise/bleed easily.  Psychiatric/Behavioral:  Negative for depression. The patient is not nervous/anxious.       Past Medical History:  Diagnosis Date    Arthritis    OSTEOARTHRITIS   Cancer (HCC)    Cavitary lesion of lung    RIGHT LOWER LOBE   Chicken pox    Colon cancer (HCC)    History of kidney stones    Hypertension    Lipoma of colon    Nephrolithiasis    Nephrolithiasis    Obesity    Shingles    Tubular adenoma of colon    multiple fragments    Past Surgical History:  Procedure Laterality Date   COLON SURGERY     COLONOSCOPY N/A 10/02/2014   Procedure: COLONOSCOPY;  Surgeon: Elnita Maxwell, MD;  Location: [facility] ENDOSCOPY;  Service: Endoscopy;  Laterality: N/A;   COLONOSCOPY WITH PROPOFOL N/A 02/16/2017   Procedure: COLONOSCOPY WITH PROPOFOL;  Surgeon: Scot Jun, MD;  Location: [facility] ENDOSCOPY;  Service: Endoscopy;  Laterality: N/A;   COLONOSCOPY WITH PROPOFOL N/A 10/05/2020   Procedure: COLONOSCOPY WITH PROPOFOL;  Surgeon: Regis Bill, MD;  Location: [facility] ENDOSCOPY;  Service: Endoscopy;  Laterality: N/A;   EUS N/A 04/17/2019  Procedure: FULL UPPER ENDOSCOPIC ULTRASOUND (EUS) RADIAL;  Surgeon: Bearl Mulberry, MD;  Location: [facility] ENDOSCOPY;  Service: Gastroenterology;  Laterality: N/A;   KIDNEY STONE SURGERY     PARTIAL COLECTOMY  10/17/2013   PORTACATH PLACEMENT Right 06/13/2019   Procedure: INSERTION PORT-A-CATH;  Surgeon: Sung Amabile, DO;  Location: [facility] ORS;  Service: General;  Laterality: Right;    Family History  Problem Relation Age of Onset   Cancer Mother    Breast cancer Mother    COPD Father     Social History:  reports that he has never smoked. He has never used smokeless tobacco. He reports current alcohol use. He reports that he does not use drugs.    Allergies: No Known Allergies  Current Medications: Current Outpatient Medications  Medication Sig Dispense Refill   acetaminophen (TYLENOL) 500 MG tablet Take 500 mg by mouth every 6 (six) hours as needed.     amLODipine (NORVASC) 2.5 MG tablet Take 2.5 mg by mouth daily.     aspirin EC 81 MG tablet Take 81 mg by mouth daily.      Calcium Carbonate (CALCIUM 600 PO) Take 1 tablet by mouth 2 (two) times daily.     Cholecalciferol (VITAMIN D) 125 MCG (5000 UT) CAPS Take 5,000 mg by mouth.     docusate sodium (COLACE) 100 MG capsule Take 1 capsule (100 mg total) by mouth 2 (two) times daily as needed for mild constipation or moderate constipation. 60 capsule 3   HYDROcodone-acetaminophen (NORCO/VICODIN) 5-325 MG tablet Take 1 tablet by mouth every 6 (six) hours as needed for moderate pain (up to 3 doses for moderate pain.).     lidocaine-prilocaine (EMLA) cream APPLY TO AFFECTED AREA ONCE AS DIRECTED 30 g 3   loratadine (CLARITIN) 10 MG tablet Take 10 mg by mouth daily.     megestrol (MEGACE) 40 MG tablet TAKE 2 TABLETS BY MOUTH 2 TIMES DAILY. 360 tablet 1   olmesartan (BENICAR) 20 MG tablet Take 1 tablet by mouth daily.     ondansetron (ZOFRAN) 8 MG tablet Take 1 tablet (8 mg total) by mouth 2 (two) times daily as needed for refractory nausea / vomiting. Start on day 3 after chemotherapy. 60 tablet 1   potassium chloride SA (KLOR-CON M20) 20 MEQ tablet Take 1 tablet (20 mEq total) by mouth daily. 180 tablet 0   Probiotic Product (PROBIOTIC DAILY PO) Take 1 tablet by mouth daily.     loperamide (IMODIUM) 2 MG capsule Take 1 capsule (2 mg total) by mouth See admin instructions. Take 2 tablets after first loose stool,  then 1 tablet  after each loose stool; maximum: 8 tablets /day (Patient not taking: Reported on 06/19/2023) 60 capsule 0   prochlorperazine (COMPAZINE) 10 MG tablet Take 1 tablet (10 mg total) by mouth every 6 (six) hours as needed (NAUSEA). (Patient not taking: Reported on 06/19/2023) 30 tablet 1   senna-docusate (SENNA S) 8.6-50 MG tablet Take 2 tablets by mouth daily. (Patient not taking: Reported on 06/19/2023) 60 tablet 3   No current facility-administered medications for this visit.   Facility-Administered Medications Ordered in Other Visits  Medication Dose Route Frequency Provider Last Rate Last Admin   0.9 %   sodium chloride infusion   Intravenous Once Corcoran, Melissa C, MD       0.9 %  sodium chloride infusion   Intravenous Continuous Rosey Bath, MD 10 mL/hr at 12/31/19 1000 New Bag at 10/05/20 1045   heparin lock flush  100 unit/mL  500 Units Intravenous Once Corcoran, Melissa C, MD       sodium chloride flush (NS) 0.9 % injection 10 mL  10 mL Intracatheter PRN [name], MD   10 mL at 02/01/23 1341    Performance status (ECOG): 1  Vitals Blood pressure 125/71, pulse 76, temperature 98.2 F (36.8 C), resp. rate 18, weight 207 lb 6.4 oz (94.1 kg), SpO2 98%.   Physical Exam Constitutional:      General: He is not in acute distress.    Appearance: He is obese. He is not diaphoretic.  HENT:     Head: Normocephalic and atraumatic.  Eyes:     General: No scleral icterus. Cardiovascular:     Rate and Rhythm: Normal rate and regular rhythm.     Heart sounds: No murmur heard. Pulmonary:     Effort: Pulmonary effort is normal. No respiratory distress.     Breath sounds: No wheezing.     Comments: Decreased breath sound bilaterally.  Abdominal:     General: Bowel sounds are normal. There is no distension.     Palpations: Abdomen is soft.  Musculoskeletal:        General: Normal range of motion.     Cervical back: Normal range of motion and neck supple.     Comments: Trace edema bilaterally  Skin:    General: Skin is warm and dry.     Findings: No erythema.  Neurological:     Mental Status: He is alert and oriented to person, place, and time. Mental status is at baseline.     Motor: No abnormal muscle tone.  Psychiatric:        Mood and Affect: Mood and affect normal.     Labs were reviewed by me.     Latest Ref Rng & Units 06/19/2023    7:56 AM 06/05/2023    7:59 AM 05/22/2023    8:31 AM  CBC  WBC 4.0 - 10.5 K/uL 4.0  5.7  4.9   Hemoglobin 13.0 - 17.0 g/dL 95.6  21.3  08.6   Hematocrit 39.0 - 52.0 % 35.2  33.2  31.3   Platelets 150 - 400 K/uL 150  194  204       Latest  Ref Rng & Units 06/19/2023    7:56 AM 06/05/2023    7:59 AM 05/22/2023    8:31 AM  CMP  Glucose 70 - 99 mg/dL 578  469  98   BUN 8 - 23 mg/dL 10  14  11    Creatinine 0.61 - 1.24 mg/dL 6.29  5.28  4.13   Sodium 135 - 145 mmol/L 136  138  140   Potassium 3.5 - 5.1 mmol/L 3.6  3.4  3.4   Chloride 98 - 111 mmol/L 103  105  107   CO2 22 - 32 mmol/L 25  26  24    Calcium 8.9 - 10.3 mg/dL 8.8  8.9  8.6   Total Protein 6.5 - 8.1 g/dL 6.0  5.6  5.6   Total Bilirubin 0.0 - 1.2 mg/dL 1.2  1.0  1.0   Alkaline Phos 38 - 126 U/L 51  54  60   AST 15 - 41 U/L 27  22  36   ALT 0 - 44 U/L 17  18  32

## 2023-06-19 NOTE — Assessment & Plan Note (Signed)
 Stable continue to monitor

## 2023-06-19 NOTE — Patient Instructions (Signed)

## 2023-06-19 NOTE — Assessment & Plan Note (Signed)
CT images shows slight lung progression -Finished palliative radiation on 03/05/2023. Labs reviewed and discussed with patient Labs are reviewed and discussed with patient. Proceed with 5-FU and Bevavizumab today

## 2023-06-19 NOTE — Assessment & Plan Note (Signed)
 Chemotherapy plan as listed above

## 2023-06-21 ENCOUNTER — Inpatient Hospital Stay: Payer: Medicare Other

## 2023-06-21 VITALS — BP 137/90 | HR 76 | Temp 97.3°F | Resp 18

## 2023-06-21 DIAGNOSIS — Z85038 Personal history of other malignant neoplasm of large intestine: Secondary | ICD-10-CM

## 2023-06-21 DIAGNOSIS — Z5112 Encounter for antineoplastic immunotherapy: Secondary | ICD-10-CM | POA: Diagnosis not present

## 2023-06-21 DIAGNOSIS — C786 Secondary malignant neoplasm of retroperitoneum and peritoneum: Secondary | ICD-10-CM

## 2023-06-21 MED ORDER — HEPARIN SOD (PORK) LOCK FLUSH 100 UNIT/ML IV SOLN
500.0000 [IU] | Freq: Once | INTRAVENOUS | Status: AC | PRN
  Administered 2023-06-21: 500 [IU]
  Filled 2023-06-21: qty 5

## 2023-06-21 MED ORDER — SODIUM CHLORIDE 0.9% FLUSH
10.0000 mL | INTRAVENOUS | Status: DC | PRN
  Administered 2023-06-21: 10 mL
  Filled 2023-06-21: qty 10

## 2023-07-02 ENCOUNTER — Telehealth: Payer: Self-pay | Admitting: Oncology

## 2023-07-02 ENCOUNTER — Other Ambulatory Visit: Payer: Self-pay | Admitting: Oncology

## 2023-07-02 DIAGNOSIS — C786 Secondary malignant neoplasm of retroperitoneum and peritoneum: Secondary | ICD-10-CM

## 2023-07-02 DIAGNOSIS — Z85038 Personal history of other malignant neoplasm of large intestine: Secondary | ICD-10-CM

## 2023-07-02 NOTE — Telephone Encounter (Signed)
Spoke to pt's wife Bonita Quin and informed her that Dr. Cathie Hoops can change order for pump to run slower and he can come in for pump DC on [place] OR tx can be postponed to next week.   They prefer to postpone tx to next week, Tuesday preferred. Please call Bonita Quin [phone]) with new appt info

## 2023-07-02 NOTE — Telephone Encounter (Signed)
Pt wife called and asked about the potential weather on Wednesday and Thursday. She stated he had a port DC on Thursday and wanted to know what the potential options would be if he was not able to come in on Thursday.

## 2023-07-03 ENCOUNTER — Inpatient Hospital Stay: Payer: Medicare Other

## 2023-07-03 ENCOUNTER — Inpatient Hospital Stay: Payer: Medicare Other | Admitting: Oncology

## 2023-07-10 ENCOUNTER — Inpatient Hospital Stay (HOSPITAL_BASED_OUTPATIENT_CLINIC_OR_DEPARTMENT_OTHER): Payer: Medicare Other | Admitting: Oncology

## 2023-07-10 ENCOUNTER — Inpatient Hospital Stay: Payer: Medicare Other

## 2023-07-10 ENCOUNTER — Encounter: Payer: Self-pay | Admitting: Oncology

## 2023-07-10 VITALS — BP 137/83 | HR 68 | Temp 98.1°F | Resp 18 | Wt 207.0 lb

## 2023-07-10 DIAGNOSIS — C786 Secondary malignant neoplasm of retroperitoneum and peritoneum: Secondary | ICD-10-CM

## 2023-07-10 DIAGNOSIS — C184 Malignant neoplasm of transverse colon: Secondary | ICD-10-CM | POA: Diagnosis not present

## 2023-07-10 DIAGNOSIS — Z85038 Personal history of other malignant neoplasm of large intestine: Secondary | ICD-10-CM | POA: Diagnosis not present

## 2023-07-10 DIAGNOSIS — R918 Other nonspecific abnormal finding of lung field: Secondary | ICD-10-CM | POA: Diagnosis not present

## 2023-07-10 DIAGNOSIS — Z5111 Encounter for antineoplastic chemotherapy: Secondary | ICD-10-CM

## 2023-07-10 DIAGNOSIS — D6481 Anemia due to antineoplastic chemotherapy: Secondary | ICD-10-CM

## 2023-07-10 DIAGNOSIS — T451X5A Adverse effect of antineoplastic and immunosuppressive drugs, initial encounter: Secondary | ICD-10-CM

## 2023-07-10 DIAGNOSIS — T451X5D Adverse effect of antineoplastic and immunosuppressive drugs, subsequent encounter: Secondary | ICD-10-CM

## 2023-07-10 DIAGNOSIS — Z5112 Encounter for antineoplastic immunotherapy: Secondary | ICD-10-CM | POA: Diagnosis not present

## 2023-07-10 LAB — CBC WITH DIFFERENTIAL/PLATELET
Abs Immature Granulocytes: 0.03 10*3/uL (ref 0.00–0.07)
Basophils Absolute: 0 10*3/uL (ref 0.0–0.1)
Basophils Relative: 1 %
Eosinophils Absolute: 0.1 10*3/uL (ref 0.0–0.5)
Eosinophils Relative: 2 %
HCT: 35.4 % — ABNORMAL LOW (ref 39.0–52.0)
Hemoglobin: 11.5 g/dL — ABNORMAL LOW (ref 13.0–17.0)
Immature Granulocytes: 1 %
Lymphocytes Relative: 14 %
Lymphs Abs: 0.7 10*3/uL (ref 0.7–4.0)
MCH: 31.7 pg (ref 26.0–34.0)
MCHC: 32.5 g/dL (ref 30.0–36.0)
MCV: 97.5 fL (ref 80.0–100.0)
Monocytes Absolute: 0.6 10*3/uL (ref 0.1–1.0)
Monocytes Relative: 12 %
Neutro Abs: 3.3 10*3/uL (ref 1.7–7.7)
Neutrophils Relative %: 70 %
Platelets: 199 10*3/uL (ref 150–400)
RBC: 3.63 MIL/uL — ABNORMAL LOW (ref 4.22–5.81)
RDW: 13.2 % (ref 11.5–15.5)
WBC: 4.7 10*3/uL (ref 4.0–10.5)
nRBC: 0 % (ref 0.0–0.2)

## 2023-07-10 LAB — PROTEIN, URINE, RANDOM: Total Protein, Urine: 76 mg/dL

## 2023-07-10 LAB — COMPREHENSIVE METABOLIC PANEL
ALT: 17 U/L (ref 0–44)
AST: 28 U/L (ref 15–41)
Albumin: 3.4 g/dL — ABNORMAL LOW (ref 3.5–5.0)
Alkaline Phosphatase: 52 U/L (ref 38–126)
Anion gap: 7 (ref 5–15)
BUN: 12 mg/dL (ref 8–23)
CO2: 26 mmol/L (ref 22–32)
Calcium: 8.9 mg/dL (ref 8.9–10.3)
Chloride: 102 mmol/L (ref 98–111)
Creatinine, Ser: 0.65 mg/dL (ref 0.61–1.24)
GFR, Estimated: >60 mL/min (ref >60)
Glucose, Bld: 112 mg/dL — ABNORMAL HIGH (ref 70–99)
Potassium: 4 mmol/L (ref 3.5–5.1)
Sodium: 135 mmol/L (ref 135–145)
Total Bilirubin: 0.9 mg/dL (ref 0.0–1.2)
Total Protein: 5.9 g/dL — ABNORMAL LOW (ref 6.5–8.1)

## 2023-07-10 MED ORDER — SODIUM CHLORIDE 0.9 % IV SOLN
Freq: Once | INTRAVENOUS | Status: AC
  Filled 2023-07-10: qty 250

## 2023-07-10 MED ORDER — SODIUM CHLORIDE 0.9 % IV SOLN
5.0000 mg/kg | Freq: Once | INTRAVENOUS | Status: AC
  Administered 2023-07-10: 500 mg via INTRAVENOUS
  Filled 2023-07-10: qty 16

## 2023-07-10 MED ORDER — SODIUM CHLORIDE 0.9 % IV SOLN
2400.0000 mg/m2 | INTRAVENOUS | Status: DC
  Administered 2023-07-10: 5000 mg via INTRAVENOUS
  Filled 2023-07-10: qty 100

## 2023-07-10 MED ORDER — SODIUM CHLORIDE 0.9 % IV SOLN
400.0000 mg/m2 | Freq: Once | INTRAVENOUS | Status: AC
  Administered 2023-07-10: 860 mg via INTRAVENOUS
  Filled 2023-07-10 (×2): qty 43

## 2023-07-10 NOTE — Assessment & Plan Note (Signed)
 Stable continue to monitor

## 2023-07-10 NOTE — Assessment & Plan Note (Signed)
 Chemotherapy plan as listed above

## 2023-07-10 NOTE — Patient Instructions (Signed)
 [facility] - A DEPT OF [facility]  Discharge Instructions: Thank you for choosing [facility] to provide your oncology and hematology care.  If you have a lab appointment with the Cancer Center, please go directly to the Cancer Center and check in at the registration area.  Wear comfortable clothing and clothing appropriate for easy access to any Portacath or PICC line.   We strive to give you quality time with your provider. You may need to reschedule your appointment if you arrive late (15 or more minutes).  Arriving late affects you and other patients whose appointments are after yours.  Also, if you miss three or more appointments without notifying the office, you may be dismissed from the clinic at the provider's discretion.      For prescription refill requests, have your pharmacy contact our office and allow 72 hours for refills to be completed.    Today you received the following chemotherapy and/or immunotherapy agents Vegzelma, Leucovorin, Adrucil      To help prevent nausea and vomiting after your treatment, we encourage you to take your nausea medication as directed.  BELOW ARE SYMPTOMS THAT SHOULD BE REPORTED IMMEDIATELY: *FEVER GREATER THAN 100.4 F (38 C) OR HIGHER *CHILLS OR SWEATING *NAUSEA AND VOMITING THAT IS NOT CONTROLLED WITH YOUR NAUSEA MEDICATION *UNUSUAL SHORTNESS OF BREATH *UNUSUAL BRUISING OR BLEEDING *URINARY PROBLEMS (pain or burning when urinating, or frequent urination) *BOWEL PROBLEMS (unusual diarrhea, constipation, pain near the anus) TENDERNESS IN MOUTH AND THROAT WITH OR WITHOUT PRESENCE OF ULCERS (sore throat, sores in mouth, or a toothache) UNUSUAL RASH, SWELLING OR PAIN  UNUSUAL VAGINAL DISCHARGE OR ITCHING   Items with * indicate a potential emergency and should be followed up as soon as possible or go to the Emergency Department if any problems should occur.  Please show the CHEMOTHERAPY ALERT CARD  or IMMUNOTHERAPY ALERT CARD at check-in to the Emergency Department and triage nurse.  Should you have questions after your visit or need to cancel or reschedule your appointment, please contact [facility] - A DEPT OF [facility]  [phone] and follow the prompts.  Office hours are 8:00 a.m. to 4:30 p.m. Monday - Friday. Please note that voicemails left after 4:00 p.m. may not be returned until the following business day.  We are closed weekends and major holidays. You have access to a nurse at all times for urgent questions. Please call the main number to the clinic [phone] and follow the prompts.  For any non-urgent questions, you may also contact your provider using MyChart. We now offer e-Visits for anyone 27 and older to request care online for non-urgent symptoms. For details visit [email or web address].   Also download the MyChart app! Go to the app store, search "MyChart", open the app, select [place], and log in with your MyChart username and password.

## 2023-07-10 NOTE — Progress Notes (Signed)
 Hematology/Oncology Progress note [phone] [phone]    Chief Complaint: Jared Gutierrez is a 83 y.o.  male presents for follow-up of metastatic colon cancer   ASSESSMENT & PLAN:   Cancer Staging  Cancer of transverse colon [facility]) Staging form: Colon and Rectum, [name], [name], [name], MD on 10/26/2021   Cancer of transverse colon [facility]) CT images shows slight lung progression -Finished palliative radiation on 03/05/2023. Labs reviewed and discussed with patient Labs are reviewed and discussed with patient. Proceed with 5-FU and Bevavizumab today Plan to repeat CT in late March early April.   Anemia due to chemotherapy Stable continue to monitor.  Encounter for antineoplastic chemotherapy Chemotherapy plan as listed above.   Lung nodules Slow growing small lung nodule, discussed with Dr. Rushie Chestnut.  Will hold off radiation and continue close monitor.   Follow-up 2 weeks  All questions were answered. The patient knows to call the clinic with any problems, questions or concerns.  [name], MD, PhD [facility] Hematology Oncology 07/10/2023     PERTINENT ONCOLOGY HISTORY Jared Gutierrez is a 83 y.o.[place] who has above oncology history reviewed by me today presented for follow up visit for management of Metastatic colon cancer. [name], patient switched care to me on 11/11/20 Extensive medical record review was performed by me  Oncology History  History of colon cancer, stage I  10/02/2014 Pathology Results   [id number] colonoscopy pathology A. CECAL POLYPS; HOT SNARE AND COLD BIOPSY:  - TUBULAR ADENOMA, MULTIPLE FRAGMENTS.  - NEGATIVE FOR HIGH-GRADE DYSPLASIA AND MALIGNANCY.   B. COLON POLYP, TRANSVERSE; COLD BIOPSY:  - TUBULAR ADENOMA.  - NEGATIVE FOR HIGH-GRADE DYSPLASIA AND MALIGNANCY.     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  Tumor extended into the submucosa. Margins were negative. + lymphovascular invasion. 14 lymph nodes were negative. Pathologic stage was T1 N0.  TRANSVERSE COLON, RESECTION:  - INVASIVE ADENOCARCINOMA ARISING IN A 4.6 CM TUBULOVILLOUS  ADENOMA WITH HIGH GRADE DYSPLASIA (MALIGNANT POLYP).  - SEE SUMMARY BELOW.  [name]  ONCOLOGY SUMMARY: COLON AND RECTUM, RESECTION, AJCC 7TH EDITION  Specimen: transverse colon  Procedure: transverse colon resection  Tumor site: splenic flexure  Tumor size: 1.0 cm (invasive component)  Macroscopic tumor perforation: not specified  Histologic type: invasive adenocarcinoma  Histologic grade: moderately differentiated  Microscopic tumor extension: into submucosa  Margins:       Proximal margin: negative       Distal margin: negative       Circumferential (radial) or mesenteric margin: negative       If all margins uninvolved by invasive carcinoma:  Distance of invasive carcinoma from closest margin: 7.5 cm  to distal margin  Treatment effect: not applicable  Lymph-vascular invasion: present  Perineural invasion: not identified  Tumor deposits (discontinuous extramural extension): not  identified   Pathologic staging:       Primary tumor: pT1       Regional lymph nodes: pN0            Number of nodes examined: 14            Number of nodes involved: 0    04/02/2019 Progression   04/02/2019  PET scan  limited evealed a 2.4 x 2.3 cm (SUV 11) hypermetabolic soft tissue density caudal and anterior to the pancreatic neck favored to represent isolated peritoneal or nodal metastasis in the setting of prior transverse colonic resection (expected primary drainage). Although this was immediately adjacent to the pancreas, a fat plane was maintained, arguing strongly against a pancreatic primary. Otherwise, there was no evidence of hypermetabolic metastasis. 04/17/2019 EUS on  revealed a normal esophagus, stomach, duodenum, and pancreas.  There was a 2.4 x 2.4 cm irregular mass in the retroperitoneum adjacent to, but not involving the pancreatic neck.  FNA and core needle biopsy were performed.  Pathology revealed adenocarcinoma compatible with a metastatic lesion of colorectal origin.  Tumor cells were positive for CK20 and CDX2 and negative for CK7.  NGS: [employer] on 05/15/2019 revealed + [id number] and TP53.  Negative results included BRAF V600E, Her2, NRAS, NTRK, PD-L1 (<1%), and TMB 8.7/Mb (intermediate).  MMR testing from his colon resection on 10/16/2013 was intact with a low probability of MSI-H.   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  He received Neulasta after cycle #4 and #5 secondary to progressive leukopenia.  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  He received a truncated course of FOLFOX with cycle #11 secondary to oxaliplatin reaction.   01/14/2020 Imaging    PET revealed an interval decrease in size and FDG uptake (2.4 x 2.3 cm with SUV 11 to 2.4 x 1.7 cm with SUV 4.09) associated with the previously referenced soft tissue  density caudal and anterior to the pancreatic neck suggesting treatment response. There were no new sites of FDG avid tumor.   08/31/2020 Imaging   08/31/2020 Abdomen and pelvis CT revealed increased size of the index soft tissue lesion inferior and anterior to the pancreatic neck (2.9 x 1.8 cm to 3.5 x 2.9 cm). There were similar prominent retroperitoneal lymph nodes without adenopathy by size criteria. There was no new or enlarging abdominal or pelvic lymph nodes. There were no new interval findings. There was similar circumferential wall thickening of a nondistended urinary bladder, which likely accentuated wall thickening There was hepatic steatosis and aortic atherosclerosis.   11/03/2020, PET showed recurrent peritoneal metastasis in the upper abdomen adjacent to the pancreas.  The lesion is 3.8 x 2.9 cm with SUV of 11.3. No evidence of metastatic peritoneal disease elsewhere in the abdomen pelvis.  No evidence of distant metastasis.   11/03/2020 Imaging   PET scan showed recurrent peritoneal metastasis near pancreas. Given that he has no other distant metastasis. Discussed with radiation oncology. Repeat SBRT may be considered if he does not respond well to systemic chemotherapy.     11/26/2020 -  Chemotherapy   5-FU and bevacizumab.   02/17/2021 Imaging  02/17/2021 CT abdomen pelvis showed partial response. Mild decrease of peritoneal lesion.  Proceed with 5-FU and bevacizumab today.  Given that he is tolerating current regimen with good life quality, partial response.  Shared decision was made to hold off adding additional chemotherapy agents.   06/08/2021, CT chest abdomen pelvis with contrast showed soft tissue mass at the base of mesentery inferior to the pancreatic neck is unchanged in size.  No progressive disease was identified.   09/07/2021, CT chest abdomen pelvis with contrast showed no substantial changes in size of the mesenteric mass, 2.6 cm.  No suspicious new lesions.  Chronic  findings as detailed in the imaging report.   12/05/2021 Imaging   PET scan showed soft tissue mesenteric mass is unchanged in size.  Decreased FDG uptake compared to activity on November 03, 2020.  Fever treated disease.  No new metastatic disease.   03/21/2022 Imaging   CT chest abdomen pelvis  1. Similar size of the peripancreatic presumed peritoneal implant compared to 09/07/2021. 2. No new or progressive disease. 3. Incidental findings, including: Coronary artery atherosclerosis.Aortic Atherosclerosis (ICD10-I70.0). Prostatomegaly with chronic bladder wall thickening, suggesting outlet obstruction.     07/11/2022 Imaging   CT chest abdomen pelvis with contrast showed 1. New 6 mm nodule in the right upper lobe and 2 mm nodule in the left upper lobe. Although small these are concerning. Recommend short follow-up in 3 months 2. Stable low-attenuation lesion along the midbody of the pancreas. Continued attention on follow-up. No new peritoneal mass lesions or ascites. 3. Fatty liver infiltration 4. Colonic surgical changes   10/03/2022 Imaging   CT chest abdomen pelvis w contrast showed 1. Slight interval enlargement and cavitation of a nodule in the right upper lobe, measuring 0.6 cm, previously 0.5 cm. Slight interval enlargement of a left upper lobe nodule measuring 0.3 cm,previously 0.2 cm. Findings are concerning for slowly enlarging small pulmonary metastases. 2. No significant change in a lobulated soft tissue mass within the central small bowel mesentery abutting the inferior aspect of the pancreas, consistent with a stable, treated metastasis. 3. No other evidence of lymphadenopathy or metastatic disease in the chest, abdomen, or pelvis. 4. Prostatomegaly with diffuse wall thickening of the relatively decompressed urinary bladder, likely secondary to chronic outlet obstruction. 5. Unchanged fibrotic scarring and bronchiectasis of the bilateral lung bases. This could be further assessed by  pulmonary referral and dedicated interstitial lung disease protocol CT examination of the chest if and when clinically appropriate in the setting of known metastatic malignancy. Aortic Atherosclerosis    12/27/2022 Imaging   CT chest abdomen pelvis w contrast showed 1. Continued slight interval enlargement of a cavitary nodule in the right upper lobe measuring 0.7 x 0.6 cm, as well as a nodule in the peripheral left upper lobe measuring 0.5 cm. These are highly suspicious for slowly enlarging pulmonary metastases but remain below size threshold for reliable PET-CT characterization. 2. Unchanged, lobulated hypodense soft tissue mass in the central small bowel mesentery, consistent with treated metastatic disease. 3. Status post transverse colon resection and reanastomosis. 4. Thickening of the distal sigmoid colon and rectum, consistent with nonspecific infectious or inflammatory colitis. 5. Prostatomegaly with diffuse thickening of the urinary bladder wall, likely secondary to chronic outlet obstruction.   Aortic Atherosclerosis (ICD10-I70.0).   01/09/2023 Imaging   PET scan showed Status post transverse colectomy. 4.2 cm soft tissue mass in the central abdominal mesentery, suspicious for residual/recurrent nodal metastasis. Additional 8 mm short axis left para-aortic nodal metastasis.  7 mm cavitary right upper lobe nodule and 5 mm left upper lobe nodule, both beneath the size threshold for PET sensitivity, but suspicious for small pulmonary metastases.   01/30/2023 -  Radiation Therapy   Palliative radiation to mesenteric soft tissue mass.    05/07/2023 Imaging   CT chest abdomen pelvis w contrast showed  Focal nodular area along the inferior margin of the midbody of the pancreas similar to previous. There is also some abnormal soft tissue extending into the porta hepatis encasing the hepatic artery which compared to previous is also slightly increased.   New small soft tissue nodule  adjacent to the SMA proximally.   Previous hypermetabolic node left para-aortic is similar in size to previous.   Stable bilateral small lung nodules. Right lesions again cavitary. No new lung nodules.   Stable surgical changes.  Chest port.     Other medical problems Chronic lower extremity edema. 12/24/2018, right lower extremity duplex negative for DVT.  Small right Baker's cyst. 08/16/2019, bilateral lower extremity duplex showed no DVT. 08/16/2019 - 08/17/2019 with right lower extremity cellulitis.  He was unable to bear weight.  He was treated with IV fluids, NSAIDs, colchicine, and broad antibiotics (vancomycin and Cefepime).  He was discharged on indomethacin x 5 days and Keflex 500 mg TID x 5 days.  10/05/2020, colonoscopy showed internal hemorrhoids.  Otherwise normal examination.  INTERVAL HISTORY Jared Gutierrez is a 83 y.o. male who has above history reviewed by me today presents for follow up visit for management of recurrent metastatic colon cancer Patient was accompanied by wife Denies fever, chills, nausea, vomiting, diarrhea, chest pain, shortness of breath, abdominal pain,  He tolerated last chemotherapy well.  Appetite is fair.    Review of Systems  Constitutional:  Negative for appetite change, chills, diaphoresis, fever and unexpected weight change.  HENT:   Negative for hearing loss, nosebleeds, sore throat and tinnitus.   Respiratory:  Negative for cough, hemoptysis and shortness of breath.   Cardiovascular:  Negative for chest pain and palpitations.  Gastrointestinal:  Negative for abdominal pain, blood in stool, constipation, diarrhea, nausea and vomiting.  Genitourinary:  Negative for dysuria, frequency and hematuria.   Musculoskeletal:  Positive for arthralgias. Negative for back pain, myalgias and neck pain.  Skin:  Negative for itching and rash.  Neurological:  Positive for numbness. Negative for dizziness and headaches.  Hematological:  Does not  bruise/bleed easily.  Psychiatric/Behavioral:  Negative for depression. The patient is not nervous/anxious.       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   Chicken pox    Colon cancer (HCC)    History of kidney stones    Hypertension    Lipoma of colon    Nephrolithiasis    Nephrolithiasis    Obesity    Shingles    Tubular adenoma of colon    multiple fragments    Past Surgical History:  Procedure Laterality Date   COLON SURGERY     COLONOSCOPY N/A 10/02/2014   Procedure: COLONOSCOPY;  Surgeon: Elnita Maxwell, MD;  Location: [facility] ENDOSCOPY;  Service: Endoscopy;  Laterality: N/A;   COLONOSCOPY WITH PROPOFOL N/A 02/16/2017   Procedure: COLONOSCOPY WITH PROPOFOL;  Surgeon: Scot Jun, MD;  Location: [facility] ENDOSCOPY;  Service: Endoscopy;  Laterality: N/A;   COLONOSCOPY WITH PROPOFOL N/A 10/05/2020   Procedure: COLONOSCOPY  WITH PROPOFOL;  Surgeon: Regis Bill, MD;  Location: [facility] ENDOSCOPY;  Service: Endoscopy;  Laterality: N/A;   EUS N/A 04/17/2019   Procedure: FULL UPPER ENDOSCOPIC ULTRASOUND (EUS) RADIAL;  Surgeon: Bearl Mulberry, MD;  Location: [facility] ENDOSCOPY;  Service: Gastroenterology;  Laterality: N/A;   KIDNEY STONE SURGERY     PARTIAL COLECTOMY  10/17/2013   PORTACATH PLACEMENT Right 06/13/2019   Procedure: INSERTION PORT-A-CATH;  Surgeon: Sung Amabile, DO;  Location: [facility] ORS;  Service: General;  Laterality: Right;    Family History  Problem Relation Age of Onset   Cancer Mother    Breast cancer Mother    COPD Father     Social History:  reports that he has never smoked. He has never used smokeless tobacco. He reports current alcohol use. He reports that he does not use drugs.    Allergies: No Known Allergies  Current Medications: Current Outpatient Medications  Medication Sig Dispense Refill   acetaminophen (TYLENOL) 500 MG tablet Take 500 mg by  mouth every 6 (six) hours as needed.     amLODipine (NORVASC) 2.5 MG tablet Take 2.5 mg by mouth daily.     aspirin EC 81 MG tablet Take 81 mg by mouth daily.     Calcium Carbonate (CALCIUM 600 PO) Take 1 tablet by mouth 2 (two) times daily.     Cholecalciferol (VITAMIN D) 125 MCG (5000 UT) CAPS Take 5,000 mg by mouth.     docusate sodium (COLACE) 100 MG capsule Take 1 capsule (100 mg total) by mouth 2 (two) times daily as needed for mild constipation or moderate constipation. 60 capsule 3   HYDROcodone-acetaminophen (NORCO/VICODIN) 5-325 MG tablet Take 1 tablet by mouth every 6 (six) hours as needed for moderate pain (up to 3 doses for moderate pain.).     lidocaine-prilocaine (EMLA) cream APPLY TO AFFECTED AREA ONCE AS DIRECTED 30 g 3   loratadine (CLARITIN) 10 MG tablet Take 10 mg by mouth daily.     megestrol (MEGACE) 40 MG tablet TAKE 2 TABLETS BY MOUTH 2 TIMES DAILY. 360 tablet 1   olmesartan (BENICAR) 20 MG tablet Take 1 tablet by mouth daily.     ondansetron (ZOFRAN) 8 MG tablet Take 1 tablet (8 mg total) by mouth 2 (two) times daily as needed for refractory nausea / vomiting. Start on day 3 after chemotherapy. 60 tablet 1   potassium chloride SA (KLOR-CON M20) 20 MEQ tablet Take 1 tablet (20 mEq total) by mouth daily. 180 tablet 0   Probiotic Product (PROBIOTIC DAILY PO) Take 1 tablet by mouth daily.     loperamide (IMODIUM) 2 MG capsule Take 1 capsule (2 mg total) by mouth See admin instructions. Take 2 tablets after first loose stool,  then 1 tablet  after each loose stool; maximum: 8 tablets /day (Patient not taking: Reported on 02/27/2023) 60 capsule 0   prochlorperazine (COMPAZINE) 10 MG tablet Take 1 tablet (10 mg total) by mouth every 6 (six) hours as needed (NAUSEA). (Patient not taking: Reported on 02/27/2023) 30 tablet 1   senna-docusate (SENNA S) 8.6-50 MG tablet Take 2 tablets by mouth daily. (Patient not taking: Reported on 07/10/2023) 60 tablet 3   No current  facility-administered medications for this visit.   Facility-Administered Medications Ordered in Other Visits  Medication Dose Route Frequency Provider Last Rate Last Admin   0.9 %  sodium chloride infusion   Intravenous Once Corcoran, Melissa C, MD       0.9 %  sodium  chloride infusion   Intravenous Continuous Nelva Nay C, MD 10 mL/hr at 12/31/19 1000 New Bag at 10/05/20 1045   heparin lock flush 100 unit/mL  500 Units Intravenous Once Corcoran, Melissa C, MD       sodium chloride flush (NS) 0.9 % injection 10 mL  10 mL Intracatheter PRN [name], MD   10 mL at 02/01/23 1341    Performance status (ECOG): 1  Vitals Blood pressure 137/83, pulse 68, temperature 98.1 F (36.7 C), temperature source Tympanic, resp. rate 18, weight 207 lb (93.9 kg), SpO2 100%.   Physical Exam Constitutional:      General: He is not in acute distress.    Appearance: He is obese. He is not diaphoretic.  HENT:     Head: Normocephalic and atraumatic.  Eyes:     General: No scleral icterus. Cardiovascular:     Rate and Rhythm: Normal rate and regular rhythm.     Heart sounds: No murmur heard. Pulmonary:     Effort: Pulmonary effort is normal. No respiratory distress.     Breath sounds: No wheezing.     Comments: Decreased breath sound bilaterally.  Abdominal:     General: Bowel sounds are normal. There is no distension.     Palpations: Abdomen is soft.  Musculoskeletal:        General: Normal range of motion.     Cervical back: Normal range of motion and neck supple.     Comments: Trace edema bilaterally  Skin:    General: Skin is warm and dry.     Findings: No erythema.  Neurological:     Mental Status: He is alert and oriented to person, place, and time. Mental status is at baseline.     Motor: No abnormal muscle tone.  Psychiatric:        Mood and Affect: Mood and affect normal.     Labs were reviewed by me.     Latest Ref Rng & Units 07/10/2023   10:43 AM 06/19/2023    7:56 AM  06/05/2023    7:59 AM  CBC  WBC 4.0 - 10.5 K/uL 4.7  4.0  5.7   Hemoglobin 13.0 - 17.0 g/dL 16.1  09.6  04.5   Hematocrit 39.0 - 52.0 % 35.4  35.2  33.2   Platelets 150 - 400 K/uL 199  150  194       Latest Ref Rng & Units 07/10/2023   10:43 AM 06/19/2023    7:56 AM 06/05/2023    7:59 AM  CMP  Glucose 70 - 99 mg/dL 409  811  914   BUN 8 - 23 mg/dL 12  10  14    Creatinine 0.61 - 1.24 mg/dL 7.82  9.56  2.13   Sodium 135 - 145 mmol/L 135  136  138   Potassium 3.5 - 5.1 mmol/L 4.0  3.6  3.4   Chloride 98 - 111 mmol/L 102  103  105   CO2 22 - 32 mmol/L 26  25  26    Calcium 8.9 - 10.3 mg/dL 8.9  8.8  8.9   Total Protein 6.5 - 8.1 g/dL 5.9  6.0  5.6   Total Bilirubin 0.0 - 1.2 mg/dL 0.9  1.2  1.0   Alkaline Phos 38 - 126 U/L 52  51  54   AST 15 - 41 U/L 28  27  22    ALT 0 - 44 U/L 17  17  18

## 2023-07-10 NOTE — Assessment & Plan Note (Addendum)
 CT images shows slight lung progression -Finished palliative radiation on 03/05/2023. Labs reviewed and discussed with patient Labs are reviewed and discussed with patient. Proceed with 5-FU and Bevavizumab today Plan to repeat CT in late March early April.

## 2023-07-10 NOTE — Assessment & Plan Note (Signed)
Slow growing small lung nodule, discussed with Dr. Rushie Chestnut.  Will hold off radiation and continue close monitor.

## 2023-07-11 ENCOUNTER — Other Ambulatory Visit: Payer: Self-pay

## 2023-07-12 ENCOUNTER — Inpatient Hospital Stay: Payer: Medicare Other

## 2023-07-12 VITALS — BP 161/85 | HR 51

## 2023-07-12 DIAGNOSIS — Z85038 Personal history of other malignant neoplasm of large intestine: Secondary | ICD-10-CM

## 2023-07-12 DIAGNOSIS — Z5112 Encounter for antineoplastic immunotherapy: Secondary | ICD-10-CM | POA: Diagnosis not present

## 2023-07-12 DIAGNOSIS — C786 Secondary malignant neoplasm of retroperitoneum and peritoneum: Secondary | ICD-10-CM

## 2023-07-12 MED ORDER — SODIUM CHLORIDE 0.9% FLUSH
10.0000 mL | INTRAVENOUS | Status: DC | PRN
  Administered 2023-07-12: 10 mL
  Filled 2023-07-12: qty 10

## 2023-07-12 MED ORDER — HEPARIN SOD (PORK) LOCK FLUSH 100 UNIT/ML IV SOLN
500.0000 [IU] | Freq: Once | INTRAVENOUS | Status: AC | PRN
Start: 2023-07-12 — End: 2023-07-12
  Administered 2023-07-12: 500 [IU]
  Filled 2023-07-12: qty 5

## 2023-07-19 ENCOUNTER — Ambulatory Visit: Payer: Medicare Other | Admitting: Radiation Oncology

## 2023-07-19 DIAGNOSIS — C184 Malignant neoplasm of transverse colon: Secondary | ICD-10-CM | POA: Insufficient documentation

## 2023-07-19 DIAGNOSIS — Z923 Personal history of irradiation: Secondary | ICD-10-CM | POA: Insufficient documentation

## 2023-07-19 DIAGNOSIS — C7889 Secondary malignant neoplasm of other digestive organs: Secondary | ICD-10-CM | POA: Insufficient documentation

## 2023-07-19 DIAGNOSIS — K5900 Constipation, unspecified: Secondary | ICD-10-CM | POA: Insufficient documentation

## 2023-07-24 ENCOUNTER — Emergency Department
Admission: EM | Admit: 2023-07-24 | Discharge: 2023-07-24 | Disposition: A | Discharge status: Home / Self Care | Attending: Emergency Medicine | Admitting: Emergency Medicine

## 2023-07-24 ENCOUNTER — Inpatient Hospital Stay (HOSPITAL_BASED_OUTPATIENT_CLINIC_OR_DEPARTMENT_OTHER): Payer: Medicare Other | Admitting: Oncology

## 2023-07-24 ENCOUNTER — Other Ambulatory Visit: Payer: Self-pay

## 2023-07-24 ENCOUNTER — Emergency Department

## 2023-07-24 ENCOUNTER — Encounter: Payer: Self-pay | Admitting: Nurse Practitioner

## 2023-07-24 ENCOUNTER — Inpatient Hospital Stay: Payer: Medicare Other

## 2023-07-24 ENCOUNTER — Encounter: Payer: Self-pay | Admitting: Oncology

## 2023-07-24 ENCOUNTER — Other Ambulatory Visit: Payer: Self-pay | Admitting: Nurse Practitioner

## 2023-07-24 ENCOUNTER — Inpatient Hospital Stay: Payer: Medicare Other | Attending: Oncology

## 2023-07-24 ENCOUNTER — Other Ambulatory Visit: Payer: Self-pay | Admitting: Lab

## 2023-07-24 VITALS — BP 97/67 | HR 92

## 2023-07-24 DIAGNOSIS — Z452 Encounter for adjustment and management of vascular access device: Secondary | ICD-10-CM | POA: Insufficient documentation

## 2023-07-24 DIAGNOSIS — E860 Dehydration: Secondary | ICD-10-CM | POA: Insufficient documentation

## 2023-07-24 DIAGNOSIS — Z860100 Personal history of colon polyps, unspecified: Secondary | ICD-10-CM | POA: Insufficient documentation

## 2023-07-24 DIAGNOSIS — Z803 Family history of malignant neoplasm of breast: Secondary | ICD-10-CM | POA: Diagnosis not present

## 2023-07-24 DIAGNOSIS — C184 Malignant neoplasm of transverse colon: Secondary | ICD-10-CM

## 2023-07-24 DIAGNOSIS — E669 Obesity, unspecified: Secondary | ICD-10-CM | POA: Diagnosis not present

## 2023-07-24 DIAGNOSIS — C786 Secondary malignant neoplasm of retroperitoneum and peritoneum: Secondary | ICD-10-CM

## 2023-07-24 DIAGNOSIS — R55 Syncope and collapse: Secondary | ICD-10-CM | POA: Insufficient documentation

## 2023-07-24 DIAGNOSIS — R110 Nausea: Secondary | ICD-10-CM | POA: Insufficient documentation

## 2023-07-24 DIAGNOSIS — Z836 Family history of other diseases of the respiratory system: Secondary | ICD-10-CM | POA: Diagnosis not present

## 2023-07-24 DIAGNOSIS — T451X5D Adverse effect of antineoplastic and immunosuppressive drugs, subsequent encounter: Secondary | ICD-10-CM | POA: Diagnosis not present

## 2023-07-24 DIAGNOSIS — Z79899 Other long term (current) drug therapy: Secondary | ICD-10-CM | POA: Diagnosis not present

## 2023-07-24 DIAGNOSIS — Z85038 Personal history of other malignant neoplasm of large intestine: Secondary | ICD-10-CM

## 2023-07-24 DIAGNOSIS — Z809 Family history of malignant neoplasm, unspecified: Secondary | ICD-10-CM | POA: Diagnosis not present

## 2023-07-24 DIAGNOSIS — C189 Malignant neoplasm of colon, unspecified: Secondary | ICD-10-CM | POA: Insufficient documentation

## 2023-07-24 DIAGNOSIS — R42 Dizziness and giddiness: Secondary | ICD-10-CM | POA: Diagnosis not present

## 2023-07-24 DIAGNOSIS — E876 Hypokalemia: Secondary | ICD-10-CM | POA: Diagnosis not present

## 2023-07-24 DIAGNOSIS — Z7982 Long term (current) use of aspirin: Secondary | ICD-10-CM | POA: Diagnosis not present

## 2023-07-24 DIAGNOSIS — D6481 Anemia due to antineoplastic chemotherapy: Secondary | ICD-10-CM

## 2023-07-24 DIAGNOSIS — T451X5A Adverse effect of antineoplastic and immunosuppressive drugs, initial encounter: Secondary | ICD-10-CM

## 2023-07-24 DIAGNOSIS — K648 Other hemorrhoids: Secondary | ICD-10-CM | POA: Insufficient documentation

## 2023-07-24 DIAGNOSIS — R4182 Altered mental status, unspecified: Secondary | ICD-10-CM | POA: Insufficient documentation

## 2023-07-24 DIAGNOSIS — R404 Transient alteration of awareness: Secondary | ICD-10-CM

## 2023-07-24 LAB — CBC WITH DIFFERENTIAL/PLATELET
Abs Immature Granulocytes: 0.03 10*3/uL (ref 0.00–0.07)
Basophils Absolute: 0.1 10*3/uL (ref 0.0–0.1)
Basophils Relative: 1 %
Eosinophils Absolute: 0.1 10*3/uL (ref 0.0–0.5)
Eosinophils Relative: 2 %
HCT: 37.2 % — ABNORMAL LOW (ref 39.0–52.0)
Hemoglobin: 12.2 g/dL — ABNORMAL LOW (ref 13.0–17.0)
Immature Granulocytes: 1 %
Lymphocytes Relative: 23 %
Lymphs Abs: 1.4 10*3/uL (ref 0.7–4.0)
MCH: 31.3 pg (ref 26.0–34.0)
MCHC: 32.8 g/dL (ref 30.0–36.0)
MCV: 95.4 fL (ref 80.0–100.0)
Monocytes Absolute: 0.6 10*3/uL (ref 0.1–1.0)
Monocytes Relative: 9 %
Neutro Abs: 4 10*3/uL (ref 1.7–7.7)
Neutrophils Relative %: 64 %
Platelets: 206 10*3/uL (ref 150–400)
RBC: 3.9 MIL/uL — ABNORMAL LOW (ref 4.22–5.81)
RDW: 13.2 % (ref 11.5–15.5)
WBC: 6.2 10*3/uL (ref 4.0–10.5)
nRBC: 0 % (ref 0.0–0.2)

## 2023-07-24 LAB — COMPREHENSIVE METABOLIC PANEL
ALT: 16 U/L (ref 0–44)
ALT: 16 U/L (ref 0–44)
AST: 31 U/L (ref 15–41)
AST: 29 U/L (ref 15–41)
Albumin: 3.5 g/dL (ref 3.5–5.0)
Albumin: 3.5 g/dL (ref 3.5–5.0)
Alkaline Phosphatase: 52 U/L (ref 38–126)
Alkaline Phosphatase: 48 U/L (ref 38–126)
Anion gap: 8 (ref 5–15)
Anion gap: 6 (ref 5–15)
BUN: 15 mg/dL (ref 8–23)
BUN: 16 mg/dL (ref 8–23)
CO2: 25 mmol/L (ref 22–32)
CO2: 28 mmol/L (ref 22–32)
Calcium: 8.7 mg/dL — ABNORMAL LOW (ref 8.9–10.3)
Calcium: 9.3 mg/dL (ref 8.9–10.3)
Chloride: 102 mmol/L (ref 98–111)
Chloride: 101 mmol/L (ref 98–111)
Creatinine, Ser: 0.68 mg/dL (ref 0.61–1.24)
Creatinine, Ser: 0.7 mg/dL (ref 0.61–1.24)
GFR, Estimated: >60 mL/min (ref >60)
GFR, Estimated: >60 mL/min (ref >60)
Glucose, Bld: 109 mg/dL — ABNORMAL HIGH (ref 70–99)
Glucose, Bld: 100 mg/dL — ABNORMAL HIGH (ref 70–99)
Potassium: 3.4 mmol/L — ABNORMAL LOW (ref 3.5–5.1)
Potassium: 3.7 mmol/L (ref 3.5–5.1)
Sodium: 135 mmol/L (ref 135–145)
Sodium: 135 mmol/L (ref 135–145)
Total Bilirubin: 0.9 mg/dL (ref 0.0–1.2)
Total Bilirubin: 0.8 mg/dL (ref 0.0–1.2)
Total Protein: 6.3 g/dL — ABNORMAL LOW (ref 6.5–8.1)
Total Protein: 6.2 g/dL — ABNORMAL LOW (ref 6.5–8.1)

## 2023-07-24 LAB — URINALYSIS, ROUTINE W REFLEX MICROSCOPIC
Bilirubin Urine: NEGATIVE
Glucose, UA: NEGATIVE mg/dL
Hgb urine dipstick: NEGATIVE
Ketones, ur: NEGATIVE mg/dL
Leukocytes,Ua: NEGATIVE
Nitrite: NEGATIVE
Protein, ur: 100 mg/dL — AB
Specific Gravity, Urine: 1.017 (ref 1.005–1.030)
pH: 6 (ref 5.0–8.0)

## 2023-07-24 LAB — CBC
HCT: 38.3 % — ABNORMAL LOW (ref 39.0–52.0)
Hemoglobin: 12.3 g/dL — ABNORMAL LOW (ref 13.0–17.0)
MCH: 31.7 pg (ref 26.0–34.0)
MCHC: 32.1 g/dL (ref 30.0–36.0)
MCV: 98.7 fL (ref 80.0–100.0)
Platelets: 178 10*3/uL (ref 150–400)
RBC: 3.88 MIL/uL — ABNORMAL LOW (ref 4.22–5.81)
RDW: 13.4 % (ref 11.5–15.5)
WBC: 4.7 10*3/uL (ref 4.0–10.5)
nRBC: 0 % (ref 0.0–0.2)

## 2023-07-24 LAB — TROPONIN I (HIGH SENSITIVITY)
Troponin I (High Sensitivity): 5 ng/L (ref <18)
Troponin I (High Sensitivity): 6 ng/L (ref <18)

## 2023-07-24 LAB — CBG MONITORING, ED: Glucose-Capillary: 91 mg/dL (ref 70–99)

## 2023-07-24 MED ORDER — SODIUM CHLORIDE 0.9 % IV SOLN: Freq: Once | INTRAVENOUS | Status: AC

## 2023-07-24 MED ORDER — SODIUM CHLORIDE 0.9 % IV SOLN
Freq: Once | INTRAVENOUS | Status: DC
Start: 2023-07-24 — End: 2023-07-24

## 2023-07-24 MED ORDER — HEPARIN SOD (PORK) LOCK FLUSH 100 UNIT/ML IV SOLN
INTRAVENOUS | Status: AC
  Administered 2023-07-24: 500 [IU]
  Filled 2023-07-24: qty 5

## 2023-07-24 NOTE — ED Triage Notes (Addendum)
 Pt to ED via cancer center. Pt had syncopal episode at doctors office while seeing a new PA. MD came in and reported pt seemed more confused then normal. Unsure of baseline. Unsure of LKW. Pt alert to self only. Pt has port accessed from cancer center. Unable to draw blood from port - straight stick performed for blood draw.

## 2023-07-24 NOTE — Progress Notes (Signed)
 Edit to prior notes- events happened about 1048 am.  After all done pt was assisted to bathroom and then placed in chair 8 form Dr Cathie Hoops to see

## 2023-07-24 NOTE — Progress Notes (Signed)
 Hematology/Oncology Progress note [phone] [phone]    Chief Complaint: Jared Gutierrez is a 83 y.o.  male presents for follow-up of metastatic colon cancer   ASSESSMENT & PLAN:   Cancer Staging  Cancer of transverse colon [facility]) Staging form: Colon and Rectum, [name], [name], [name], MD on 10/26/2021   Cancer of transverse colon [facility]) CT images shows slight lung progression -Finished palliative radiation on 03/05/2023. Labs reviewed and discussed with patient Labs are reviewed and discussed with patient. Hold off 5-FU and Bevavizumab today due to syncope/mental status change. Plan to repeat CT in late March early April.   Altered mental status Confusion after passing out.   CBC and CMP are not remarkable other than very mild hypokalemia. Troponin is pending.  EKG reviewed.  Chronic right bundle block. I recommend patient to go to emergency room immediately for further evaluation. I called the triage nurse and updated history.  Anemia due to chemotherapy Stable continue to monitor.    Follow-up to be determined.    All questions were answered. The patient knows to call the clinic with any problems, questions or concerns.  [name], MD, PhD [facility] Hematology Oncology 07/24/2023     PERTINENT ONCOLOGY HISTORY Jared Gutierrez is a 83 y.o.[place] who has above oncology history reviewed by me today presented for follow up visit for management of Metastatic colon cancer. [name], patient switched care to me on 11/11/20 Extensive medical record review was performed by me  Oncology History  History of colon cancer, stage I  10/02/2014 Pathology Results   [id number] colonoscopy pathology A. CECAL POLYPS; HOT SNARE AND COLD BIOPSY:  - TUBULAR ADENOMA, MULTIPLE FRAGMENTS.  - NEGATIVE FOR HIGH-GRADE DYSPLASIA AND MALIGNANCY.   B. COLON POLYP,  TRANSVERSE; COLD BIOPSY:  - TUBULAR ADENOMA.  - NEGATIVE FOR HIGH-GRADE DYSPLASIA AND MALIGNANCY.     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  Tumor extended into the submucosa. Margins were negative. + lymphovascular invasion. 14 lymph nodes were negative. Pathologic stage was T1 N0.  TRANSVERSE COLON, RESECTION:  - INVASIVE ADENOCARCINOMA ARISING IN A 4.6 CM TUBULOVILLOUS  ADENOMA WITH HIGH GRADE DYSPLASIA (MALIGNANT POLYP).  - SEE SUMMARY BELOW.  [name]  ONCOLOGY SUMMARY: COLON AND RECTUM, RESECTION, AJCC 7TH EDITION  Specimen: transverse colon  Procedure: transverse colon resection  Tumor site: splenic flexure  Tumor size: 1.0 cm (invasive component)  Macroscopic tumor perforation: not specified  Histologic type: invasive adenocarcinoma  Histologic grade: moderately differentiated  Microscopic tumor extension: into submucosa  Margins:       Proximal margin: negative  Distal margin: negative       Circumferential (radial) or mesenteric margin: negative       If all margins uninvolved by invasive carcinoma:       Distance of invasive carcinoma from closest margin: 7.5 cm  to distal margin  Treatment effect: not  applicable  Lymph-vascular invasion: present  Perineural invasion: not identified  Tumor deposits (discontinuous extramural extension): not  identified  Pathologic staging:       Primary tumor: pT1       Regional lymph nodes: pN0            Number of nodes examined: 14            Number of nodes involved: 0    04/02/2019 Progression   04/02/2019  PET scan  limited evealed a 2.4 x 2.3 cm (SUV 11) hypermetabolic soft tissue density caudal and anterior to the pancreatic neck favored to represent isolated peritoneal or nodal metastasis in the setting of prior transverse colonic resection (expected primary drainage). Although this was immediately adjacent to the pancreas, a fat plane was maintained, arguing strongly against a pancreatic primary. Otherwise, there was no evidence of hypermetabolic metastasis. 04/17/2019 EUS on  revealed a normal esophagus, stomach, duodenum, and pancreas.  There was a 2.4 x 2.4 cm irregular mass in the retroperitoneum adjacent to, but not involving the pancreatic neck.  FNA and core needle biopsy were performed.  Pathology revealed adenocarcinoma compatible with a metastatic lesion of colorectal origin.  Tumor cells were positive for CK20 and CDX2 and negative for CK7.  NGS: [employer] on 05/15/2019 revealed + [id number] and TP53.  Negative results included BRAF V600E, Her2, NRAS, NTRK, PD-L1 (<1%), and TMB 8.7/Mb (intermediate).  MMR testing from his colon resection on 10/16/2013 was intact with a low probability of MSI-H.   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  He received Neulasta after cycle #4 and #5 secondary to progressive leukopenia.  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  He received a truncated course of FOLFOX with cycle #11 secondary to oxaliplatin reaction.   01/14/2020 Imaging    PET revealed  an interval decrease in size and FDG uptake (2.4 x 2.3 cm with SUV 11 to 2.4 x 1.7 cm with SUV 4.09) associated with the previously referenced soft tissue density caudal and anterior to the pancreatic neck suggesting treatment response. There were no new sites of FDG avid tumor.   08/31/2020 Imaging   08/31/2020 Abdomen and pelvis CT revealed increased size of the index soft tissue lesion inferior and anterior to the pancreatic neck (2.9 x 1.8 cm to 3.5 x 2.9 cm). There were similar prominent retroperitoneal lymph nodes without adenopathy by size criteria. There was no new or enlarging abdominal or pelvic lymph nodes. There were no new interval findings. There was similar circumferential wall thickening of a nondistended urinary bladder, which likely accentuated wall thickening There was hepatic steatosis and aortic atherosclerosis.   11/03/2020, PET showed recurrent peritoneal metastasis in the upper abdomen adjacent to the pancreas.  The lesion is 3.8 x 2.9 cm with SUV of 11.3. No evidence of metastatic peritoneal disease elsewhere in the abdomen pelvis.  No evidence of distant metastasis.   11/03/2020 Imaging   PET scan showed recurrent peritoneal metastasis near pancreas. Given that he has no other distant metastasis. Discussed with radiation  oncology. Repeat SBRT may be considered if he does not respond well to systemic chemotherapy.     11/26/2020 -  Chemotherapy   5-FU and bevacizumab.   02/17/2021 Imaging   02/17/2021 CT abdomen pelvis showed partial response. Mild decrease of peritoneal lesion.  Proceed with 5-FU and bevacizumab today.  Given that he is tolerating current regimen with good life quality, partial response.  Shared decision was made to hold off adding additional chemotherapy agents.   06/08/2021, CT chest abdomen pelvis with contrast showed soft tissue mass at the base of mesentery inferior to the pancreatic neck is unchanged in size.  No progressive disease was identified.    09/07/2021, CT chest abdomen pelvis with contrast showed no substantial changes in size of the mesenteric mass, 2.6 cm.  No suspicious new lesions.  Chronic findings as detailed in the imaging report.   12/05/2021 Imaging   PET scan showed soft tissue mesenteric mass is unchanged in size.  Decreased FDG uptake compared to activity on November 03, 2020.  Fever treated disease.  No new metastatic disease.   03/21/2022 Imaging   CT chest abdomen pelvis  1. Similar size of the peripancreatic presumed peritoneal implant compared to 09/07/2021. 2. No new or progressive disease. 3. Incidental findings, including: Coronary artery atherosclerosis.Aortic Atherosclerosis (ICD10-I70.0). Prostatomegaly with chronic bladder wall thickening, suggesting outlet obstruction.     07/11/2022 Imaging   CT chest abdomen pelvis with contrast showed 1. New 6 mm nodule in the right upper lobe and 2 mm nodule in the left upper lobe. Although small these are concerning. Recommend short follow-up in 3 months 2. Stable low-attenuation lesion along the midbody of the pancreas. Continued attention on follow-up. No new peritoneal mass lesions or ascites. 3. Fatty liver infiltration 4. Colonic surgical changes   10/03/2022 Imaging   CT chest abdomen pelvis w contrast showed 1. Slight interval enlargement and cavitation of a nodule in the right upper lobe, measuring 0.6 cm, previously 0.5 cm. Slight interval enlargement of a left upper lobe nodule measuring 0.3 cm,previously 0.2 cm. Findings are concerning for slowly enlarging small pulmonary metastases. 2. No significant change in a lobulated soft tissue mass within the central small bowel mesentery abutting the inferior aspect of the pancreas, consistent with a stable, treated metastasis. 3. No other evidence of lymphadenopathy or metastatic disease in the chest, abdomen, or pelvis. 4. Prostatomegaly with diffuse wall thickening of the relatively decompressed urinary bladder,  likely secondary to chronic outlet obstruction. 5. Unchanged fibrotic scarring and bronchiectasis of the bilateral lung bases. This could be further assessed by pulmonary referral and dedicated interstitial lung disease protocol CT examination of the chest if and when clinically appropriate in the setting of known metastatic malignancy. Aortic Atherosclerosis    12/27/2022 Imaging   CT chest abdomen pelvis w contrast showed 1. Continued slight interval enlargement of a cavitary nodule in the right upper lobe measuring 0.7 x 0.6 cm, as well as a nodule in the peripheral left upper lobe measuring 0.5 cm. These are highly suspicious for slowly enlarging pulmonary metastases but remain below size threshold for reliable PET-CT characterization. 2. Unchanged, lobulated hypodense soft tissue mass in the central small bowel mesentery, consistent with treated metastatic disease. 3. Status post transverse colon resection and reanastomosis. 4. Thickening of the distal sigmoid colon and rectum, consistent with nonspecific infectious or inflammatory colitis. 5. Prostatomegaly with diffuse thickening of the urinary bladder wall, likely secondary to chronic outlet obstruction.   Aortic Atherosclerosis (ICD10-I70.0).   01/09/2023  Imaging   PET scan showed Status post transverse colectomy. 4.2 cm soft tissue mass in the central abdominal mesentery, suspicious for residual/recurrent nodal metastasis. Additional 8 mm short axis left para-aortic nodal metastasis. 7 mm cavitary right upper lobe nodule and 5 mm left upper lobe nodule, both beneath the size threshold for PET sensitivity, but suspicious for small pulmonary metastases.   01/30/2023 -  Radiation Therapy   Palliative radiation to mesenteric soft tissue mass.    05/07/2023 Imaging   CT chest abdomen pelvis w contrast showed  Focal nodular area along the inferior margin of the midbody of the pancreas similar to previous. There is also some abnormal  soft tissue extending into the porta hepatis encasing the hepatic artery which compared to previous is also slightly increased.   New small soft tissue nodule adjacent to the SMA proximally.   Previous hypermetabolic node left para-aortic is similar in size to previous.   Stable bilateral small lung nodules. Right lesions again cavitary. No new lung nodules.   Stable surgical changes.  Chest port.     Other medical problems Chronic lower extremity edema. 12/24/2018, right lower extremity duplex negative for DVT.  Small right Baker's cyst. 08/16/2019, bilateral lower extremity duplex showed no DVT. 08/16/2019 - 08/17/2019 with right lower extremity cellulitis.  He was unable to bear weight.  He was treated with IV fluids, NSAIDs, colchicine, and broad antibiotics (vancomycin and Cefepime).  He was discharged on indomethacin x 5 days and Keflex 500 mg TID x 5 days.  10/05/2020, colonoscopy showed internal hemorrhoids.  Otherwise normal examination.  INTERVAL HISTORY Jared Gutierrez is a 83 y.o. male who has above history reviewed by me today presents for follow up visit for management of recurrent metastatic colon cancer Patient was accompanied by wife  Patient was at infusion center getting Mediport access.  He reports feeling dizzy and passed out around 10:48 AM .  Vital signs BP was 123/67, and repeat Bp was  97/60 with HR 92 patient was seen by nurse practitioner Lauren immediately after the event. Patient was initially disoriented and mental status improved  and returned to his baseline and was oriented. Pulse was regular.   Blood work was sent off including troponin which is still pending at the time of dictation.  I evaluated patient at infusion center today around 11:40 AM.  Wife is at the chair side. Patient appears to be not at his baseline at that time.  He has slow response to questions, not able to recognize me and cannot tell his home address.  He answers some other questions  correctly.  Patient says that he felt the dizziness at the time of passing out.  Currently he denies any dizziness or lightheadedness,  headache, SOB, chest pain, focal weakness. Wife also feels that he is not himself currently.   Review of Systems  Constitutional:  Negative for appetite change, chills, diaphoresis, fever and unexpected weight change.  HENT:   Negative for hearing loss, nosebleeds, sore throat and tinnitus.   Respiratory:  Negative for cough, hemoptysis and shortness of breath.   Cardiovascular:  Negative for chest pain and palpitations.  Gastrointestinal:  Negative for abdominal pain, blood in stool, constipation, diarrhea, nausea and vomiting.  Genitourinary:  Negative for dysuria, frequency and hematuria.   Musculoskeletal:  Positive for arthralgias. Negative for back pain, myalgias and neck pain.  Skin:  Negative for itching and rash.  Neurological:  Positive for dizziness and numbness. Negative for headaches.  Hematological:  Does not bruise/bleed easily.  Psychiatric/Behavioral:  Negative for depression. The patient is not nervous/anxious.       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   Chicken pox    Colon cancer (HCC)    History of kidney stones    Hypertension    Lipoma of colon    Nephrolithiasis    Nephrolithiasis    Obesity    Shingles    Tubular adenoma of colon    multiple fragments    Past Surgical History:  Procedure Laterality Date   COLON SURGERY     COLONOSCOPY N/A 10/02/2014   Procedure: COLONOSCOPY;  Surgeon: Elnita Maxwell, MD;  Location: [facility] ENDOSCOPY;  Service: Endoscopy;  Laterality: N/A;   COLONOSCOPY WITH PROPOFOL N/A 02/16/2017   Procedure: COLONOSCOPY WITH PROPOFOL;  Surgeon: Scot Jun, MD;  Location: [facility] ENDOSCOPY;  Service: Endoscopy;  Laterality: N/A;   COLONOSCOPY WITH PROPOFOL N/A 10/05/2020   Procedure: COLONOSCOPY WITH  PROPOFOL;  Surgeon: Regis Bill, MD;  Location: [facility] ENDOSCOPY;  Service: Endoscopy;  Laterality: N/A;   EUS N/A 04/17/2019   Procedure: FULL UPPER ENDOSCOPIC ULTRASOUND (EUS) RADIAL;  Surgeon: Bearl Mulberry, MD;  Location: [facility] ENDOSCOPY;  Service: Gastroenterology;  Laterality: N/A;   KIDNEY STONE SURGERY     PARTIAL COLECTOMY  10/17/2013   PORTACATH PLACEMENT Right 06/13/2019   Procedure: INSERTION PORT-A-CATH;  Surgeon: Sung Amabile, DO;  Location: [facility] ORS;  Service: General;  Laterality: Right;    Family History  Problem Relation Age of Onset   Cancer Mother    Breast cancer Mother    COPD Father     Social History:  reports that he has never smoked. He has never used smokeless tobacco. He reports current alcohol use. He reports that he does not use drugs.    Allergies: No Known Allergies  Current Medications: Current Outpatient Medications  Medication Sig Dispense Refill   acetaminophen (TYLENOL) 500 MG tablet Take 500 mg by mouth every 6 (six) hours as needed.     amLODipine (NORVASC) 2.5 MG tablet Take 2.5 mg by mouth daily.     aspirin EC 81 MG tablet Take 81 mg by mouth daily.     Calcium Carbonate (CALCIUM 600 PO) Take 1 tablet by mouth 2 (two) times daily.     Cholecalciferol (VITAMIN D) 125 MCG (5000 UT) CAPS Take 5,000 mg by mouth.     docusate sodium (COLACE) 100 MG capsule Take 1 capsule (100 mg total) by mouth 2 (two) times daily as needed for mild constipation or moderate constipation. 60 capsule 3   HYDROcodone-acetaminophen (NORCO/VICODIN) 5-325 MG tablet Take 1 tablet by mouth every 6 (six) hours as needed for moderate pain (up to 3 doses for moderate pain.).     lidocaine-prilocaine (EMLA) cream APPLY TO AFFECTED AREA ONCE AS DIRECTED 30 g 3   loperamide (IMODIUM) 2 MG capsule Take 1 capsule (2 mg total) by mouth See admin instructions. Take 2 tablets after first loose stool,  then 1 tablet  after each loose stool; maximum: 8 tablets /day (Patient  not taking: Reported on 02/27/2023) 60 capsule 0   loratadine (CLARITIN) 10 MG tablet Take 10 mg by mouth daily.     megestrol (MEGACE) 40 MG tablet TAKE 2 TABLETS BY MOUTH 2 TIMES DAILY. 360 tablet 1   olmesartan (BENICAR) 20 MG tablet  Take 1 tablet by mouth daily.     ondansetron (ZOFRAN) 8 MG tablet Take 1 tablet (8 mg total) by mouth 2 (two) times daily as needed for refractory nausea / vomiting. Start on day 3 after chemotherapy. 60 tablet 1   potassium chloride SA (KLOR-CON M20) 20 MEQ tablet Take 1 tablet (20 mEq total) by mouth daily. 180 tablet 0   Probiotic Product (PROBIOTIC DAILY PO) Take 1 tablet by mouth daily.     prochlorperazine (COMPAZINE) 10 MG tablet Take 1 tablet (10 mg total) by mouth every 6 (six) hours as needed (NAUSEA). (Patient not taking: Reported on 02/27/2023) 30 tablet 1   senna-docusate (SENNA S) 8.6-50 MG tablet Take 2 tablets by mouth daily. (Patient not taking: Reported on 07/10/2023) 60 tablet 3   No current facility-administered medications for this visit.   Facility-Administered Medications Ordered in Other Visits  Medication Dose Route Frequency Provider Last Rate Last Admin   0.9 %  sodium chloride infusion   Intravenous Once Corcoran, Melissa C, MD       0.9 %  sodium chloride infusion   Intravenous Continuous Merlene Pulling, Melissa C, MD 10 mL/hr at 12/31/19 1000 New Bag at 10/05/20 1045   heparin lock flush 100 unit/mL  500 Units Intravenous Once Corcoran, Melissa C, MD       sodium chloride flush (NS) 0.9 % injection 10 mL  10 mL Intracatheter PRN [name], MD   10 mL at 02/01/23 1341    Performance status (ECOG): 1  Vitals Blood pressure (!) 161/85, pulse (!) 51.   Physical Exam Constitutional:      General: He is not in acute distress.    Appearance: He is obese. He is not diaphoretic.  HENT:     Head: Normocephalic and atraumatic.  Eyes:     General: No scleral icterus. Cardiovascular:     Rate and Rhythm: Normal rate and regular rhythm.      Heart sounds: No murmur heard. Pulmonary:     Effort: Pulmonary effort is normal. No respiratory distress.     Breath sounds: No wheezing.     Comments: Decreased breath sound bilaterally.  Abdominal:     General: Bowel sounds are normal. There is no distension.     Palpations: Abdomen is soft.  Musculoskeletal:        General: Normal range of motion.     Cervical back: Normal range of motion and neck supple.     Comments: Trace edema bilaterally  Skin:    General: Skin is warm and dry.     Findings: No erythema.  Neurological:     Mental Status: He is alert.     Cranial Nerves: No cranial nerve deficit.     Motor: No weakness or abnormal muscle tone.     Comments: Orientated x 2, slow response to questions  Psychiatric:        Mood and Affect: Mood and affect normal.     Labs were reviewed by me.     Latest Ref Rng & Units 07/24/2023   11:00 AM 07/10/2023   10:43 AM 06/19/2023    7:56 AM  CBC  WBC 4.0 - 10.5 K/uL 6.2  4.7  4.0   Hemoglobin 13.0 - 17.0 g/dL 16.1  09.6  04.5   Hematocrit 39.0 - 52.0 % 37.2  35.4  35.2   Platelets 150 - 400 K/uL 206  199  150       Latest Ref Rng & Units  07/24/2023   11:00 AM 07/10/2023   10:43 AM 06/19/2023    7:56 AM  CMP  Glucose 70 - 99 mg/dL 147  829  562   BUN 8 - 23 mg/dL 15  12  10    Creatinine 0.61 - 1.24 mg/dL 1.30  8.65  7.84   Sodium 135 - 145 mmol/L 135  135  136   Potassium 3.5 - 5.1 mmol/L 3.4  4.0  3.6   Chloride 98 - 111 mmol/L 102  102  103   CO2 22 - 32 mmol/L 25  26  25    Calcium 8.9 - 10.3 mg/dL 8.7  8.9  8.8   Total Protein 6.5 - 8.1 g/dL 6.3  5.9  6.0   Total Bilirubin 0.0 - 1.2 mg/dL 0.9  0.9  1.2   Alkaline Phos 38 - 126 U/L 52  52  51   AST 15 - 41 U/L 31  28  27    ALT 0 - 44 U/L 16  17  17

## 2023-07-24 NOTE — Assessment & Plan Note (Addendum)
 Confusion after passing out.   CBC and CMP are not remarkable other than very mild hypokalemia. Troponin is pending.  EKG reviewed.  Chronic right bundle block. I recommend patient to go to emergency room immediately for further evaluation. I called the triage nurse and updated history.

## 2023-07-24 NOTE — ED Provider Notes (Signed)
 [facility] Provider Note    Event Date/Time   First MD Initiated Contact with Patient 07/24/23 1246     (approximate)   History   Altered Mental Status   HPI  Jared Gutierrez is a 83 y.o. male with a history of colon cancer undergoing treatment at the cancer center who presents after a near syncopal episode.  Patient apparently was feeling dizzy while in the waiting room at the cancer center, when they took him back he had a near syncopal episode, was referred to the emergency department.  Currently now he feels better and has no complaints.  No reports of chest pain or palpitations.  Normal stools     Physical Exam   Triage Vital Signs: ED Triage Vitals [07/24/23 1202]  Encounter Vitals Group     BP 127/71     Systolic BP Percentile      Diastolic BP Percentile      Pulse Rate 72     Resp 20     Temp 98.3 F (36.8 C)     Temp Source Oral     SpO2      Weight      Height      Head Circumference      Peak Flow      Pain Score      Pain Loc      Pain Education      Exclude from Growth Chart     Most recent vital signs: Vitals:   07/24/23 1800 07/24/23 1900  BP: (!) 167/99 (!) 168/85  Pulse: 64 61  Resp: 16 14  Temp:    SpO2: 99% 99%     General: Awake, no distress.  Hard of hearing CV:  Good peripheral perfusion.  Regular rate and rhythm Resp:  Normal effort.  Abd:  No distention.  Soft, nontender Other:  Normal neuroexam   ED Results / Procedures / Treatments   Labs (all labs ordered are listed, but only abnormal results are displayed) Labs Reviewed  COMPREHENSIVE METABOLIC PANEL - Abnormal; Notable for the following components:      Result Value   Glucose, Bld 100 (*)    Total Protein 6.2 (*)    All other components within normal limits  CBC - Abnormal; Notable for the following components:   RBC 3.88 (*)    Hemoglobin 12.3 (*)    HCT 38.3 (*)    All other components within normal limits  URINALYSIS, ROUTINE W  REFLEX MICROSCOPIC - Abnormal; Notable for the following components:   Color, Urine YELLOW (*)    APPearance CLEAR (*)    Protein, ur 100 (*)    Bacteria, UA RARE (*)    All other components within normal limits  CBG MONITORING, ED  TROPONIN I (HIGH SENSITIVITY)     EKG  ED ECG REPORT I, Jene Every, the attending physician, personally viewed and interpreted this ECG.  Date: 07/24/2023  Rhythm: normal sinus rhythm QRS Axis: normal Intervals: normal ST/T Wave abnormalities: normal Narrative Interpretation: no evidence of acute ischemia    RADIOLOGY CT head viewed interpret by me, no acute abnormality, confirmed by radiology    PROCEDURES:  Critical Care performed:   Procedures   MEDICATIONS ORDERED IN ED: Medications  0.9 %  sodium chloride infusion (0 mLs Intravenous Stopped 07/24/23 1702)  heparin lock flush 100 UNIT/ML injection (500 Units  Given 07/24/23 1904)     IMPRESSION / MDM / ASSESSMENT AND PLAN /  ED COURSE  I reviewed the triage vital signs and the nursing notes. Patient's presentation is most consistent with acute presentation with potential threat to life or bodily function.  Patient presents after a syncopal episode as detailed above, differential is extensive, includes dehydration, orthostatic hypotension, medication reaction, no chest pain or palpitations to suggest arrhythmia or ACS  EKG is reassuring, lab work reviewed, patient treated with IV fluids, will check orthostatics  ----------------------------------------- 3:20 PM on 07/24/2023 ----------------------------------------- Patient feeling improved, orthostatics pending      FINAL CLINICAL IMPRESSION(S) / ED DIAGNOSES   Final diagnoses:  Near syncope  Dehydration     Rx / DC Orders   ED Discharge Orders     None        Note:  This document was prepared using Dragon voice recognition software and may include unintentional dictation errors.   Jene Every,  MD 07/25/23 [phone]

## 2023-07-24 NOTE — Assessment & Plan Note (Signed)
 CT images shows slight lung progression -Finished palliative radiation on 03/05/2023. Labs reviewed and discussed with patient Labs are reviewed and discussed with patient. Hold off 5-FU and Bevavizumab today due to syncope/mental status change. Plan to repeat CT in late March early April.

## 2023-07-24 NOTE — ED Provider Notes (Signed)
 Plan from off going team to discharge if orthostatics was negative.   Orthostatic negative UA negative Repeat trop negative  Patient reports resolution of symptoms they feel comfortable with discharge home.    Concha Se, MD 07/24/23 [phone]

## 2023-07-24 NOTE — Progress Notes (Addendum)
 Called at 10:50 am to port room by nursing with reports of patient feeling dizzy and like he was going to pass out. Patient had pulse, breathing but diaphoretic. Normotensive. Pulse regular. Appeared altered and disoriented compared to baseline. EKG ordered and reviewed. Port accessed and labs drawn. He normalized within a few moments and returned to his baseline, conversational and oriented. He had appt with Dr Cathie Hoops today. I updated her for event, possibly vasovagal? Pre-syncope? Dr Cathie Hoops to follow up and manage.

## 2023-07-24 NOTE — Progress Notes (Signed)
 Upon rolling pt back in wheelchair pt mentioned not felling himself.  Did not appear like his normal behavior either. /. Once entered [facility] room-- pt noted to be not answering questions, eyes rolling back, diaphoretic, vomiting, Staff assisted, PAC accessed, EKG done BP  123/67, sats 98, hr 107.  Consuello Masse at chairside, orders obtained, labs sent. Pt did come around and answered all questions, states he didn't fell right.   Recheck bp 97/67, HR 92, sats 100%, states feels better

## 2023-07-24 NOTE — Assessment & Plan Note (Signed)
 Stable continue to monitor

## 2023-07-25 ENCOUNTER — Telehealth: Payer: Self-pay

## 2023-07-25 ENCOUNTER — Other Ambulatory Visit: Payer: Self-pay

## 2023-07-25 ENCOUNTER — Other Ambulatory Visit: Payer: Self-pay | Admitting: Oncology

## 2023-07-25 DIAGNOSIS — Z85038 Personal history of other malignant neoplasm of large intestine: Secondary | ICD-10-CM

## 2023-07-25 NOTE — Telephone Encounter (Signed)
 Per Dr. Cathie Hoops: he was discharged. please arrange him to get CT during Week of 3/25 CT chest abdomen pelvis w contrast next appt 4/1

## 2023-07-26 ENCOUNTER — Inpatient Hospital Stay: Payer: Medicare Other

## 2023-07-30 ENCOUNTER — Ambulatory Visit

## 2023-07-30 ENCOUNTER — Ambulatory Visit: Attending: Cardiology | Admitting: Cardiology

## 2023-07-30 ENCOUNTER — Encounter: Payer: Self-pay | Admitting: Cardiology

## 2023-07-30 VITALS — BP 146/68 | HR 68 | Ht 66.0 in | Wt 205.0 lb

## 2023-07-30 DIAGNOSIS — R55 Syncope and collapse: Secondary | ICD-10-CM

## 2023-07-30 DIAGNOSIS — I10 Essential (primary) hypertension: Secondary | ICD-10-CM | POA: Insufficient documentation

## 2023-07-30 NOTE — Progress Notes (Signed)
 Cardiology Office Note:    Date:  07/30/2023   ID:  Jared Gutierrez, DOB 1940/10/01, MRN [id number]  PCP:  Barbette Reichmann, MD   [facility] Providers Cardiologist:  Debbe Odea, MD     Referring MD: [name], MD   No chief complaint on file.   History of Present Illness:    Jared Gutierrez is a 83 y.o. 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  Presented to the ED 1 week ago after getting confused.  Patient was at the cancer center getting chemo access when he suddenly felt dizzy and passed out.  Symptoms lasted a few seconds, upon waking up patient initially was disoriented but mental status improved.  As per EMR reports, initial BP at the time was 123/67, repeat BP 97/60, heart rate 92.  Although symptoms improved, patient was not at baseline, denying chest pain, shortness of breath.  Patient was then transferred to [facility] ED.  Workup with EKG, troponins were unrevealing.  UA, orthostatic vitals were also unrevealing.  He denies any episodes since.  Endorse having episodes of dizziness 2 months prior after drinking a cold drink.  On day of syncope, patient endorsed drinking a cold milkshake.  Diagnosed with colon cancer in 2023.  Denies diarrhea or any recent changes in his medications.  Echocardiogram obtained 02/2023 EF 60 to 65%, impaired relaxation.  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   Chicken pox    Colon cancer (HCC)    History of kidney stones    Hypertension    Lipoma of colon    Nephrolithiasis    Nephrolithiasis    Obesity    Shingles    Tubular adenoma of colon    multiple fragments    Past Surgical History:  Procedure Laterality Date   COLON SURGERY     COLONOSCOPY N/A 10/02/2014   Procedure: COLONOSCOPY;  Surgeon: Elnita Maxwell, MD;  Location: [facility] ENDOSCOPY;  Service: Endoscopy;   Laterality: N/A;   COLONOSCOPY WITH PROPOFOL N/A 02/16/2017   Procedure: COLONOSCOPY WITH PROPOFOL;  Surgeon: Scot Jun, MD;  Location: [facility] ENDOSCOPY;  Service: Endoscopy;  Laterality: N/A;   COLONOSCOPY WITH PROPOFOL N/A 10/05/2020   Procedure: COLONOSCOPY WITH PROPOFOL;  Surgeon: Regis Bill, MD;  Location: [facility] ENDOSCOPY;  Service: Endoscopy;  Laterality: N/A;   EUS N/A 04/17/2019   Procedure: FULL UPPER ENDOSCOPIC ULTRASOUND (EUS) RADIAL;  Surgeon: Bearl Mulberry, MD;  Location: [facility] ENDOSCOPY;  Service: Gastroenterology;  Laterality: N/A;   KIDNEY STONE SURGERY     PARTIAL COLECTOMY  10/17/2013   PORTACATH PLACEMENT Right 06/13/2019   Procedure: INSERTION PORT-A-CATH;  Surgeon: Sung Amabile, DO;  Location: [facility] ORS;  Service: General;  Laterality: Right;    Current Medications: Current Meds  Medication Sig   acetaminophen (TYLENOL) 500 MG tablet Take 500 mg by mouth every 6 (six) hours as needed.   aspirin EC 81 MG tablet Take 81 mg by mouth daily.   Cholecalciferol (VITAMIN D) 125 MCG (5000 UT) CAPS Take 5,000 mg by mouth.   cyanocobalamin (VITAMIN B12) 500 MCG tablet Take by mouth.   docusate sodium (COLACE) 100 MG capsule Take 1 capsule (100 mg total) by mouth 2 (two) times daily as needed for mild constipation or moderate constipation.   Ferrous Fumarate (HEMOCYTE - 106 MG  FE) 324 (106 Fe) MG TABS tablet Take 1 tablet by mouth.   furosemide (LASIX) 20 MG tablet Take 20 mg by mouth as needed. Prn for swelling   loperamide (IMODIUM) 2 MG capsule Take 1 capsule (2 mg total) by mouth See admin instructions. Take 2 tablets after first loose stool,  then 1 tablet  after each loose stool; maximum: 8 tablets /day   olmesartan (BENICAR) 20 MG tablet Take 1 tablet by mouth daily.   potassium chloride SA (KLOR-CON M20) 20 MEQ tablet Take 1 tablet (20 mEq total) by mouth daily.   Probiotic Product (PROBIOTIC DAILY PO) Take 1 tablet by mouth daily.   prochlorperazine  (COMPAZINE) 10 MG tablet Take 1 tablet (10 mg total) by mouth every 6 (six) hours as needed (NAUSEA).   senna-docusate (SENNA S) 8.6-50 MG tablet Take 2 tablets by mouth daily.     Allergies:   Patient has no known allergies.   Social History   Socioeconomic History   Marital status: Married    Spouse name: Not on file   Number of children: Not on file   Years of education: Not on file   Highest education level: Not on file  Occupational History   Not on file  Tobacco Use   Smoking status: Never   Smokeless tobacco: Never  Vaping Use   Vaping status: Never Used  Substance and Sexual Activity   Alcohol use: Yes    Comment: socially    Drug use: No   Sexual activity: Not on file  Other Topics Concern   Not on file  Social History Narrative   Not on file   Social Drivers of Health   Financial Resource Strain: Not on file  Food Insecurity: Not on file  Transportation Needs: Not on file  Physical Activity: Not on file  Stress: Not on file  Social Connections: Not on file     Family History: The patient's family history includes Breast cancer in his mother; COPD in his father; Cancer in his mother.  ROS:   Please see the history of present illness.     All other systems reviewed and are negative.  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 Confirmed by Debbe Odea ([id number]) on 07/30/2023 10:56:31 AM    Recent Labs: 01/30/2023: B Natriuretic Peptide 35.1 07/24/2023: ALT 16; BUN 16; Creatinine, Ser 0.70; Hemoglobin 12.3; Platelets 178; Potassium 3.7; Sodium 135  Recent Lipid Panel    Component Value Date/Time   CHOL 151 01/03/2022 0829   TRIG 125 01/03/2022  0829   HDL 47 01/03/2022 0829   CHOLHDL 3.2 01/03/2022 0829   VLDL 25 01/03/2022 0829   LDLCALC 79 01/03/2022 0829     Risk Assessment/Calculations:         Physical Exam:    VS:  BP (!) 146/68 (BP Location: Left Arm, Patient Position: Sitting, Cuff Size: Normal)   Pulse 68   Ht 5\' 6"  (1.676 m)   Wt 205 lb (93 kg)   SpO2 97%   BMI 33.09 kg/m     Wt Readings from Last 3 Encounters:  07/30/23 205 lb (93 kg)  07/24/23 207 lb (93.9 kg)  07/10/23  207 lb (93.9 kg)     GEN:  Well nourished, well developed in no acute distress HEENT: Normal NECK: No JVD; No carotid bruits CARDIAC: RRR, no murmurs, rubs, gallops RESPIRATORY:  Clear to auscultation without rales, wheezing or rhonchi  ABDOMEN: Soft, non-tender, non-distended MUSCULOSKELETAL:  No edema; No deformity  SKIN: Warm and dry NEUROLOGIC:  Alert and oriented x 3 PSYCHIATRIC:  Normal affect   ASSESSMENT:    1. Syncope and collapse   2. Primary hypertension    PLAN:    In order of problems listed above:  Syncope, etiology appears vasovagal versus dehydration.  Recent echo with normal EF.  Place cardiac monitor to rule out arrhythmias.  Advised to sit when prodromal symptoms/dizziness arise.  Increase hydration also advised.  Conservative measures if cardiac monitor is otherwise normal advised. Hypertension, BP reasonable with systolic 146.  Continue Benicar 20 mg daily.      Medication Adjustments/Labs and Tests Ordered: Current medicines are reviewed at length with the patient today.  Concerns regarding medicines are outlined above.  Orders Placed This Encounter  Procedures   LONG TERM MONITOR (3-14 DAYS)   EKG 12-Lead   No orders of the defined types were placed in this encounter.   Patient Instructions  Medication Instructions:  Your physician recommends that you continue on your current medications as directed. Please refer to the Current Medication list given to you today.   *If you need a refill on  your cardiac medications before your next appointment, please call your pharmacy*   Lab Work: No labs ordered today    Testing/Procedures: Your physician has recommended that you wear a 14 day Zio monitor.   This monitor is a medical device that records the heart's electrical activity. Doctors most often use these monitors to diagnose arrhythmias. Arrhythmias are problems with the speed or rhythm of the heartbeat. The monitor is a small device applied to your chest. You can wear one while you do your normal daily activities. While wearing this monitor if you have any symptoms to push the button and record what you felt. Once you have worn this monitor for the period of time provider prescribed (Usually 14 days), you will return the monitor device in the postage paid box. Once it is returned they will download the data collected and provide [place] with a report which the provider will then review and we will call you with those results. Important tips:  Avoid showering during the first 24 hours of wearing the monitor. Avoid excessive sweating to help maximize wear time. Do not submerge the device, no hot tubs, and no swimming pools. Keep any lotions or oils away from the patch. After 24 hours you may shower with the patch on. Take brief showers with your back facing the shower head.  Do not remove patch once it has been placed because that will interrupt data and decrease adhesive wear time. Push the button when you have any symptoms and write down what you were feeling. Once you have completed wearing your monitor, remove and place into box which has postage paid and place in your outgoing mailbox.  If for some reason you have misplaced your box then call our office and we can provide another box and/or mail it off for you.      Follow-Up: At [facility], you and your health needs are our priority.  As part of our continuing mission to provide you with exceptional heart care, we have  created designated Provider  Care Teams.  These Care Teams include your primary Cardiologist (physician) and Advanced Practice Providers (APPs -  Physician Assistants and Nurse Practitioners) who all work together to provide you with the care you need, when you need it.  We recommend signing up for the patient portal called "MyChart".  Sign up information is provided on this After Visit Summary.  MyChart is used to connect with patients for Virtual Visits (Telemedicine).  Patients are able to view lab/test results, encounter notes, upcoming appointments, etc.  Non-urgent messages can be sent to your provider as well.   To learn more about what you can do with MyChart, go to [email or web address].    Your next appointment:   2 month(s)  Provider:   You may see Debbe Odea, MD or one of the following Advanced Practice Providers on your designated Care Team:   Nicolasa Ducking, NP Eula Listen, PA-C Cadence Fransico Michael, PA-C Charlsie Quest, NP Carlos Levering, NP      Signed, Debbe Odea, MD  07/30/2023 11:55 AM    [facility]

## 2023-07-30 NOTE — Patient Instructions (Signed)
 Medication Instructions:  Your physician recommends that you continue on your current medications as directed. Please refer to the Current Medication list given to you today.   *If you need a refill on your cardiac medications before your next appointment, please call your pharmacy*   Lab Work: No labs ordered today    Testing/Procedures: Your physician has recommended that you wear a 14 day Zio monitor.   This monitor is a medical device that records the heart's electrical activity. Doctors most often use these monitors to diagnose arrhythmias. Arrhythmias are problems with the speed or rhythm of the heartbeat. The monitor is a small device applied to your chest. You can wear one while you do your normal daily activities. While wearing this monitor if you have any symptoms to push the button and record what you felt. Once you have worn this monitor for the period of time provider prescribed (Usually 14 days), you will return the monitor device in the postage paid box. Once it is returned they will download the data collected and provide [place] with a report which the provider will then review and we will call you with those results. Important tips:  Avoid showering during the first 24 hours of wearing the monitor. Avoid excessive sweating to help maximize wear time. Do not submerge the device, no hot tubs, and no swimming pools. Keep any lotions or oils away from the patch. After 24 hours you may shower with the patch on. Take brief showers with your back facing the shower head.  Do not remove patch once it has been placed because that will interrupt data and decrease adhesive wear time. Push the button when you have any symptoms and write down what you were feeling. Once you have completed wearing your monitor, remove and place into box which has postage paid and place in your outgoing mailbox.  If for some reason you have misplaced your box then call our office and we can provide another box  and/or mail it off for you.      Follow-Up: At [facility], you and your health needs are our priority.  As part of our continuing mission to provide you with exceptional heart care, we have created designated Provider Care Teams.  These Care Teams include your primary Cardiologist (physician) and Advanced Practice Providers (APPs -  Physician Assistants and Nurse Practitioners) who all work together to provide you with the care you need, when you need it.  We recommend signing up for the patient portal called "MyChart".  Sign up information is provided on this After Visit Summary.  MyChart is used to connect with patients for Virtual Visits (Telemedicine).  Patients are able to view lab/test results, encounter notes, upcoming appointments, etc.  Non-urgent messages can be sent to your provider as well.   To learn more about what you can do with MyChart, go to [email or web address].    Your next appointment:   2 month(s)  Provider:   You may see Debbe Odea, MD or one of the following Advanced Practice Providers on your designated Care Team:   Nicolasa Ducking, NP Eula Listen, PA-C Cadence Fransico Michael, PA-C Charlsie Quest, NP Carlos Levering, NP

## 2023-08-01 ENCOUNTER — Other Ambulatory Visit: Payer: Self-pay

## 2023-08-02 ENCOUNTER — Other Ambulatory Visit: Payer: Self-pay

## 2023-08-02 ENCOUNTER — Telehealth: Payer: Self-pay | Admitting: *Deleted

## 2023-08-02 DIAGNOSIS — R42 Dizziness and giddiness: Secondary | ICD-10-CM

## 2023-08-02 NOTE — Telephone Encounter (Signed)
 Patient was sent to ER last visit here becausehad dizzy spells. Today he has the same thing and the EMS people suggested that he come and get fluids in the cancer center. His vitals were good just like the time he went to ER

## 2023-08-02 NOTE — Telephone Encounter (Signed)
 Spoke to Dr. Cathie Hoops, she recommends [facility] for further eval. Please schedule Lab/ [facility]/ Poss IVF.

## 2023-08-03 ENCOUNTER — Inpatient Hospital Stay

## 2023-08-03 ENCOUNTER — Inpatient Hospital Stay: Admitting: Hospice and Palliative Medicine

## 2023-08-03 ENCOUNTER — Encounter: Payer: Self-pay | Admitting: Hospice and Palliative Medicine

## 2023-08-03 VITALS — BP 116/71 | HR 68 | Temp 97.5°F | Resp 14 | Wt 208.1 lb

## 2023-08-03 VITALS — BP 120/76 | HR 72 | Resp 16

## 2023-08-03 DIAGNOSIS — R55 Syncope and collapse: Secondary | ICD-10-CM

## 2023-08-03 DIAGNOSIS — C184 Malignant neoplasm of transverse colon: Secondary | ICD-10-CM

## 2023-08-03 DIAGNOSIS — R42 Dizziness and giddiness: Secondary | ICD-10-CM | POA: Diagnosis not present

## 2023-08-03 DIAGNOSIS — Z95828 Presence of other vascular implants and grafts: Secondary | ICD-10-CM

## 2023-08-03 DIAGNOSIS — C786 Secondary malignant neoplasm of retroperitoneum and peritoneum: Secondary | ICD-10-CM

## 2023-08-03 LAB — CBC WITH DIFFERENTIAL (CANCER CENTER ONLY)
Abs Immature Granulocytes: 0.02 10*3/uL (ref 0.00–0.07)
Basophils Absolute: 0 10*3/uL (ref 0.0–0.1)
Basophils Relative: 1 %
Eosinophils Absolute: 0.1 10*3/uL (ref 0.0–0.5)
Eosinophils Relative: 4 %
HCT: 33.5 % — ABNORMAL LOW (ref 39.0–52.0)
Hemoglobin: 11.3 g/dL — ABNORMAL LOW (ref 13.0–17.0)
Immature Granulocytes: 1 %
Lymphocytes Relative: 23 %
Lymphs Abs: 0.9 10*3/uL (ref 0.7–4.0)
MCH: 31.5 pg (ref 26.0–34.0)
MCHC: 33.7 g/dL (ref 30.0–36.0)
MCV: 93.3 fL (ref 80.0–100.0)
Monocytes Absolute: 0.4 10*3/uL (ref 0.1–1.0)
Monocytes Relative: 11 %
Neutro Abs: 2.4 10*3/uL (ref 1.7–7.7)
Neutrophils Relative %: 60 %
Platelet Count: 189 10*3/uL (ref 150–400)
RBC: 3.59 MIL/uL — ABNORMAL LOW (ref 4.22–5.81)
RDW: 12.8 % (ref 11.5–15.5)
WBC Count: 3.8 10*3/uL — ABNORMAL LOW (ref 4.0–10.5)
nRBC: 0 % (ref 0.0–0.2)

## 2023-08-03 LAB — CMP (CANCER CENTER ONLY)
ALT: 17 U/L (ref 0–44)
AST: 29 U/L (ref 15–41)
Albumin: 3.1 g/dL — ABNORMAL LOW (ref 3.5–5.0)
Alkaline Phosphatase: 49 U/L (ref 38–126)
Anion gap: 7 (ref 5–15)
BUN: 11 mg/dL (ref 8–23)
CO2: 24 mmol/L (ref 22–32)
Calcium: 9.1 mg/dL (ref 8.9–10.3)
Chloride: 99 mmol/L (ref 98–111)
Creatinine: 0.62 mg/dL (ref 0.61–1.24)
GFR, Estimated: >60 mL/min (ref >60)
Glucose, Bld: 142 mg/dL — ABNORMAL HIGH (ref 70–99)
Potassium: 4 mmol/L (ref 3.5–5.1)
Sodium: 130 mmol/L — ABNORMAL LOW (ref 135–145)
Total Bilirubin: 0.6 mg/dL (ref 0.0–1.2)
Total Protein: 5.6 g/dL — ABNORMAL LOW (ref 6.5–8.1)

## 2023-08-03 MED ORDER — SODIUM CHLORIDE 0.9 % IV SOLN
Freq: Once | INTRAVENOUS | Status: AC
  Filled 2023-08-03: qty 250

## 2023-08-03 MED ORDER — SODIUM CHLORIDE 0.9% FLUSH
10.0000 mL | Freq: Once | INTRAVENOUS | Status: AC
  Administered 2023-08-03: 10 mL via INTRAVENOUS
  Filled 2023-08-03: qty 10

## 2023-08-03 MED ORDER — HEPARIN SOD (PORK) LOCK FLUSH 100 UNIT/ML IV SOLN
500.0000 [IU] | Freq: Once | INTRAVENOUS | Status: AC
Start: 2023-08-03 — End: 2023-08-03
  Administered 2023-08-03: 500 [IU] via INTRAVENOUS
  Filled 2023-08-03: qty 5

## 2023-08-03 NOTE — Progress Notes (Signed)
 Patient called office yesterday with concerns of episodic dizziness.  With last episode yesterday morning.  Did call EMT yesterday with good vitals.  Feeling better today.

## 2023-08-03 NOTE — Progress Notes (Signed)
 Symptom Management Clinic [facility] at [facility] [phone] [phone]  Patient Care Team: Barbette Reichmann, MD as PCP - General (Internal Medicine) Debbe Odea, MD as PCP - Cardiology (Cardiology) Barbette Reichmann, MD (Internal Medicine) Elnita Maxwell, MD as Referring Physician (Gastroenterology) Benita Gutter, RN as Oncology Nurse Navigator [name], MD as Consulting Physician (Oncology) Carmina Miller, MD as Consulting Physician (Radiation Oncology)   NAME OF PATIENT: Jared Gutierrez  [id number]  02-15-1941   DATE OF VISIT: 08/03/23  REASON FOR CONSULT: Jared Gutierrez is a 83 y.o. male with multiple medical problems including metastatic colon cancer.  Patient status post XRT.  Most recently on 5-FU and Bev.  INTERVAL HISTORY: Patient saw Dr. Cathie Hoops on 07/24/2023.  With having syncope and altered mental status and so decision was made to hold off 5-FU and Bev and repeat CT in late March or early April.  Patient was transferred to the emergency department on 3/11 for altered mental status following a near syncopal event.  Workup was essentially negative and patient discharged home.  Patient has subsequently seen cardiology and is pending placement of a heart monitor.  Patient presents [facility] today for evaluation of recurrent dizziness.  Patient states that he was taking a nap yesterday and when he awoke he felt acutely dizzy and like he was going to pass out.  However, wife says that he did not lose consciousness.  She called EMS and patient had regular vitals and was advised to present to clinic today for follow-up.  Today, patient states that he feels fine.  Denies any symptomatic complaints other than poor oral intake and occasional nausea.  Denies recent fevers or illnesses. Denies any easy bleeding or bruising. Reports good appetite and denies weight loss. Denies chest pain. Denies any vomiting, constipation, or  diarrhea. Denies urinary complaints. Patient offers no further specific complaints today.   PAST MEDICAL HISTORY: 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   Chicken pox    Colon cancer (HCC)    History of kidney stones    Hypertension    Lipoma of colon    Nephrolithiasis    Nephrolithiasis    Obesity    Shingles    Tubular adenoma of colon    multiple fragments    PAST SURGICAL HISTORY:  Past Surgical History:  Procedure Laterality Date   COLON SURGERY     COLONOSCOPY N/A 10/02/2014   Procedure: COLONOSCOPY;  Surgeon: Elnita Maxwell, MD;  Location: [facility] ENDOSCOPY;  Service: Endoscopy;  Laterality: N/A;   COLONOSCOPY WITH PROPOFOL N/A 02/16/2017   Procedure: COLONOSCOPY WITH PROPOFOL;  Surgeon: Scot Jun, MD;  Location: [facility] ENDOSCOPY;  Service: Endoscopy;  Laterality: N/A;   COLONOSCOPY WITH PROPOFOL N/A 10/05/2020   Procedure: COLONOSCOPY WITH PROPOFOL;  Surgeon: Regis Bill, MD;  Location: [facility] ENDOSCOPY;  Service: Endoscopy;  Laterality: N/A;   EUS N/A 04/17/2019   Procedure: FULL UPPER ENDOSCOPIC ULTRASOUND (EUS) RADIAL;  Surgeon: Bearl Mulberry, MD;  Location: [facility] ENDOSCOPY;  Service: Gastroenterology;  Laterality: N/A;   KIDNEY STONE SURGERY     PARTIAL COLECTOMY  10/17/2013   PORTACATH PLACEMENT Right 06/13/2019   Procedure: INSERTION PORT-A-CATH;  Surgeon: Sung Amabile, DO;  Location: [facility] ORS;  Service: General;  Laterality: Right;    HEMATOLOGY/ONCOLOGY HISTORY:  Oncology History  History of colon cancer,  stage I  10/02/2014 Pathology Results   [id number] colonoscopy pathology A. CECAL POLYPS; HOT SNARE AND COLD BIOPSY:  - TUBULAR ADENOMA, MULTIPLE FRAGMENTS.  - NEGATIVE FOR HIGH-GRADE DYSPLASIA AND MALIGNANCY.   B. COLON POLYP, TRANSVERSE; COLD BIOPSY:  - TUBULAR ADENOMA.  - NEGATIVE FOR HIGH-GRADE DYSPLASIA AND MALIGNANCY.     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  Tumor extended into the submucosa. Margins were negative. + lymphovascular invasion. 14 lymph nodes were negative. Pathologic stage was T1 N0.  TRANSVERSE COLON, RESECTION:  - INVASIVE ADENOCARCINOMA ARISING IN A 4.6 CM TUBULOVILLOUS  ADENOMA WITH HIGH GRADE DYSPLASIA (MALIGNANT POLYP).  - SEE SUMMARY BELOW.  [name]  ONCOLOGY SUMMARY: COLON AND RECTUM, RESECTION, AJCC 7TH EDITION  Specimen: transverse colon  Procedure: transverse colon resection  Tumor site: splenic flexure  Tumor size: 1.0 cm (invasive component)  Macroscopic tumor perforation: not specified  Histologic type: invasive adenocarcinoma  Histologic grade: moderately differentiated  Microscopic tumor extension: into submucosa  Margins:       Proximal margin: negative       Distal margin: negative       Circumferential (radial) or mesenteric margin: negative       If all margins uninvolved by invasive carcinoma:       Distance of invasive carcinoma from closest margin: 7.5 cm  to distal margin  Treatment effect: not applicable  Lymph-vascular invasion: present  Perineural invasion: not identified  Tumor deposits (discontinuous  extramural extension): not  identified  Pathologic staging:       Primary tumor: pT1       Regional lymph nodes: pN0            Number of nodes examined: 14            Number of nodes involved: 0    04/02/2019 Progression   04/02/2019  PET scan  limited evealed a 2.4 x 2.3 cm (SUV 11) hypermetabolic soft tissue density caudal and anterior to the pancreatic neck favored to represent isolated peritoneal or nodal metastasis in the setting of prior transverse colonic resection (expected primary drainage). Although this was immediately adjacent to the pancreas, a fat plane was maintained, arguing strongly against a pancreatic primary. Otherwise, there was no evidence of hypermetabolic metastasis. 04/17/2019 EUS on  revealed a normal esophagus, stomach, duodenum, and pancreas.  There was a 2.4 x 2.4 cm irregular mass in the retroperitoneum adjacent to, but not involving the pancreatic neck.  FNA and core needle biopsy were performed.  Pathology revealed adenocarcinoma compatible with a metastatic lesion of colorectal origin.  Tumor cells were positive for CK20 and CDX2 and negative for CK7.  NGS: [employer] on 05/15/2019 revealed + [id number] and TP53.  Negative results included BRAF V600E, Her2, NRAS, NTRK, PD-L1 (<1%), and TMB 8.7/Mb (intermediate).  MMR testing from his colon resection on 10/16/2013 was intact with a low probability of MSI-H.   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  He received Neulasta after cycle #4 and #5 secondary to progressive leukopenia.  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  He received a truncated course of FOLFOX with cycle #11 secondary to oxaliplatin reaction.   01/14/2020 Imaging    PET revealed an interval decrease in size and FDG uptake (2.4 x 2.3 cm with SUV 11 to 2.4 x 1.7 cm with SUV 4.09) associated with  the previously referenced soft tissue density caudal and anterior to the pancreatic neck suggesting treatment response. There were no new sites of FDG avid tumor.   08/31/2020 Imaging   08/31/2020 Abdomen and pelvis CT revealed increased size of the index soft tissue lesion inferior and anterior to the pancreatic neck (2.9 x 1.8 cm to 3.5 x 2.9 cm). There were similar prominent retroperitoneal lymph nodes without adenopathy by size criteria. There was no new or enlarging abdominal or pelvic lymph nodes. There were no new interval findings. There was similar circumferential wall thickening of a nondistended urinary bladder, which likely accentuated wall thickening There was hepatic steatosis and aortic atherosclerosis.   11/03/2020, PET showed recurrent peritoneal metastasis in the upper abdomen adjacent to the pancreas.  The lesion is 3.8 x 2.9 cm with SUV of 11.3. No evidence of metastatic peritoneal disease elsewhere in the abdomen pelvis.  No evidence of distant metastasis.   11/03/2020 Imaging   PET scan showed recurrent peritoneal metastasis near pancreas. Given that he has no other distant metastasis. Discussed with radiation oncology. Repeat SBRT may be considered if he does not respond well to systemic chemotherapy.     11/26/2020 -  Chemotherapy   5-FU and bevacizumab.   02/17/2021 Imaging   02/17/2021 CT abdomen pelvis showed partial response. Mild decrease of peritoneal lesion.  Proceed with 5-FU and bevacizumab today.  Given that he is tolerating current regimen with good life quality, partial response.  Shared decision was made to hold off adding additional chemotherapy agents.   06/08/2021, CT chest abdomen pelvis with contrast showed soft tissue mass at the base of mesentery inferior to the pancreatic neck is unchanged in size.  No progressive disease was identified.   09/07/2021, CT chest abdomen pelvis with contrast showed no substantial changes in size of the mesenteric mass, 2.6 cm.   No suspicious new lesions.  Chronic findings as detailed in the imaging report.   12/05/2021 Imaging   PET scan showed soft tissue mesenteric mass is unchanged in size.  Decreased FDG uptake compared to activity on November 03, 2020.  Fever treated disease.  No new metastatic disease.   03/21/2022 Imaging   CT chest abdomen pelvis  1. Similar size of the peripancreatic presumed peritoneal implant compared to 09/07/2021. 2. No new or progressive disease. 3. Incidental findings, including: Coronary artery atherosclerosis.Aortic Atherosclerosis (ICD10-I70.0). Prostatomegaly with chronic bladder wall thickening, suggesting outlet obstruction.     07/11/2022 Imaging   CT chest abdomen pelvis with contrast showed 1. New 6 mm nodule in the right upper lobe and 2 mm nodule in the left upper lobe. Although small these are concerning. Recommend short follow-up in 3 months 2. Stable low-attenuation  lesion along the midbody of the pancreas. Continued attention on follow-up. No new peritoneal mass lesions or ascites. 3. Fatty liver infiltration 4. Colonic surgical changes   10/03/2022 Imaging   CT chest abdomen pelvis w contrast showed 1. Slight interval enlargement and cavitation of a nodule in the right upper lobe, measuring 0.6 cm, previously 0.5 cm. Slight interval enlargement of a left upper lobe nodule measuring 0.3 cm,previously 0.2 cm. Findings are concerning for slowly enlarging small pulmonary metastases. 2. No significant change in a lobulated soft tissue mass within the central small bowel mesentery abutting the inferior aspect of the pancreas, consistent with a stable, treated metastasis. 3. No other evidence of lymphadenopathy or metastatic disease in the chest, abdomen, or pelvis. 4. Prostatomegaly with diffuse wall thickening of the relatively decompressed urinary bladder, likely secondary to chronic outlet obstruction. 5. Unchanged fibrotic scarring and bronchiectasis of the bilateral lung  bases. This could be further assessed by pulmonary referral and dedicated interstitial lung disease protocol CT examination of the chest if and when clinically appropriate in the setting of known metastatic malignancy. Aortic Atherosclerosis    12/27/2022 Imaging   CT chest abdomen pelvis w contrast showed 1. Continued slight interval enlargement of a cavitary nodule in the right upper lobe measuring 0.7 x 0.6 cm, as well as a nodule in the peripheral left upper lobe measuring 0.5 cm. These are highly suspicious for slowly enlarging pulmonary metastases but remain below size threshold for reliable PET-CT characterization. 2. Unchanged, lobulated hypodense soft tissue mass in the central small bowel mesentery, consistent with treated metastatic disease. 3. Status post transverse colon resection and reanastomosis. 4. Thickening of the distal sigmoid colon and rectum, consistent with nonspecific infectious or inflammatory colitis. 5. Prostatomegaly with diffuse thickening of the urinary bladder wall, likely secondary to chronic outlet obstruction.   Aortic Atherosclerosis (ICD10-I70.0).   01/09/2023 Imaging   PET scan showed Status post transverse colectomy. 4.2 cm soft tissue mass in the central abdominal mesentery, suspicious for residual/recurrent nodal metastasis. Additional 8 mm short axis left para-aortic nodal metastasis. 7 mm cavitary right upper lobe nodule and 5 mm left upper lobe nodule, both beneath the size threshold for PET sensitivity, but suspicious for small pulmonary metastases.   01/30/2023 -  Radiation Therapy   Palliative radiation to mesenteric soft tissue mass.    05/07/2023 Imaging   CT chest abdomen pelvis w contrast showed  Focal nodular area along the inferior margin of the midbody of the pancreas similar to previous. There is also some abnormal soft tissue extending into the porta hepatis encasing the hepatic artery which compared to previous is also slightly  increased.   New small soft tissue nodule adjacent to the SMA proximally.   Previous hypermetabolic node left para-aortic is similar in size to previous.   Stable bilateral small lung nodules. Right lesions again cavitary. No new lung nodules.   Stable surgical changes.  Chest port.      ALLERGIES:  has no known allergies.  MEDICATIONS:  Current Outpatient Medications  Medication Sig Dispense Refill   acetaminophen (TYLENOL) 500 MG tablet Take 500 mg by mouth every 6 (six) hours as needed.     amLODipine (NORVASC) 2.5 MG tablet Take 2.5 mg by mouth daily.     aspirin EC 81 MG tablet Take 81 mg by mouth daily.     Cholecalciferol (VITAMIN D) 125 MCG (5000 UT) CAPS Take 5,000 mg by mouth.     cyanocobalamin (VITAMIN B12) 500 MCG tablet Take  by mouth.     docusate sodium (COLACE) 100 MG capsule Take 1 capsule (100 mg total) by mouth 2 (two) times daily as needed for mild constipation or moderate constipation. 60 capsule 3   Ferrous Fumarate (HEMOCYTE - 106 MG FE) 324 (106 Fe) MG TABS tablet Take 1 tablet by mouth.     loperamide (IMODIUM) 2 MG capsule Take 1 capsule (2 mg total) by mouth See admin instructions. Take 2 tablets after first loose stool,  then 1 tablet  after each loose stool; maximum: 8 tablets /day 60 capsule 0   potassium chloride SA (KLOR-CON M20) 20 MEQ tablet Take 1 tablet (20 mEq total) by mouth daily. 180 tablet 0   Probiotic Product (PROBIOTIC DAILY PO) Take 1 tablet by mouth daily.     prochlorperazine (COMPAZINE) 10 MG tablet Take 1 tablet (10 mg total) by mouth every 6 (six) hours as needed (NAUSEA). 30 tablet 1   senna-docusate (SENNA S) 8.6-50 MG tablet Take 2 tablets by mouth daily. 60 tablet 3   Calcium Carbonate (CALCIUM 600 PO) Take 1 tablet by mouth 2 (two) times daily. (Patient not taking: Reported on 08/03/2023)     furosemide (LASIX) 20 MG tablet Take 20 mg by mouth as needed. Prn for swelling (Patient not taking: Reported on 08/03/2023)      HYDROcodone-acetaminophen (NORCO/VICODIN) 5-325 MG tablet Take 1 tablet by mouth every 6 (six) hours as needed for moderate pain (up to 3 doses for moderate pain.). (Patient not taking: Reported on 08/03/2023)     lidocaine-prilocaine (EMLA) cream APPLY TO AFFECTED AREA ONCE AS DIRECTED (Patient not taking: Reported on 08/03/2023) 30 g 3   loratadine (CLARITIN) 10 MG tablet Take 10 mg by mouth daily. (Patient not taking: Reported on 08/03/2023)     megestrol (MEGACE) 40 MG tablet TAKE 2 TABLETS BY MOUTH 2 TIMES DAILY. (Patient not taking: Reported on 08/03/2023) 360 tablet 1   olmesartan (BENICAR) 20 MG tablet Take 1 tablet by mouth daily. (Patient not taking: Reported on 08/03/2023)     ondansetron (ZOFRAN) 8 MG tablet Take 1 tablet (8 mg total) by mouth 2 (two) times daily as needed for refractory nausea / vomiting. Start on day 3 after chemotherapy. (Patient not taking: Reported on 08/03/2023) 60 tablet 1   No current facility-administered medications for this visit.   Facility-Administered Medications Ordered in Other Visits  Medication Dose Route Frequency Provider Last Rate Last Admin   0.9 %  sodium chloride infusion   Intravenous Once Corcoran, Melissa C, MD       0.9 %  sodium chloride infusion   Intravenous Continuous Merlene Pulling, Melissa C, MD 10 mL/hr at 12/31/19 1000 New Bag at 10/05/20 1045   heparin lock flush 100 unit/mL  500 Units Intravenous Once Corcoran, Melissa C, MD       sodium chloride flush (NS) 0.9 % injection 10 mL  10 mL Intracatheter PRN [name], MD   10 mL at 02/01/23 1341    VITAL SIGNS: There were no vitals taken for this visit. There were no vitals filed for this visit.  Estimated body mass index is 33.09 kg/m as calculated from the following:   Height as of 07/30/23: 5\' 6"  (1.676 m).   Weight as of 07/30/23: 205 lb (93 kg).  LABS: CBC:    Component Value Date/Time   WBC 4.7 07/24/2023 1206   HGB 12.3 (L) 07/24/2023 1206   HGB 10.2 (L) 03/27/2023 1017   HGB 13.2  04/27/2014 1008  HCT 38.3 (L) 07/24/2023 1206   HCT 40.6 04/27/2014 1008   PLT 178 07/24/2023 1206   PLT 194 03/27/2023 1017   PLT 208 04/27/2014 1008   MCV 98.7 07/24/2023 1206   MCV 95 04/27/2014 1008   NEUTROABS 4.0 07/24/2023 1100   NEUTROABS 4.7 04/27/2014 1008   LYMPHSABS 1.4 07/24/2023 1100   LYMPHSABS 2.0 04/27/2014 1008   MONOABS 0.6 07/24/2023 1100   MONOABS 0.4 04/27/2014 1008   EOSABS 0.1 07/24/2023 1100   EOSABS 0.1 04/27/2014 1008   BASOSABS 0.1 07/24/2023 1100   BASOSABS 0.0 04/27/2014 1008   Comprehensive Metabolic Panel:    Component Value Date/Time   NA 135 07/24/2023 1206   NA 142 04/27/2014 1008   K 3.7 07/24/2023 1206   K 3.7 04/27/2014 1008   CL 101 07/24/2023 1206   CL 104 04/27/2014 1008   CO2 28 07/24/2023 1206   CO2 31 04/27/2014 1008   BUN 16 07/24/2023 1206   BUN 16 04/27/2014 1008   CREATININE 0.70 07/24/2023 1206   CREATININE 0.72 03/27/2023 1017   CREATININE 1.07 04/27/2014 1008   GLUCOSE 100 (H) 07/24/2023 1206   GLUCOSE 147 (H) 04/27/2014 1008   CALCIUM 9.3 07/24/2023 1206   CALCIUM 9.4 04/27/2014 1008   AST 29 07/24/2023 1206   AST 28 03/27/2023 1017   ALT 16 07/24/2023 1206   ALT 16 03/27/2023 1017   ALT 31 04/27/2014 1008   ALKPHOS 48 07/24/2023 1206   ALKPHOS 69 04/27/2014 1008   BILITOT 0.8 07/24/2023 1206   BILITOT 1.1 03/27/2023 1017   PROT 6.2 (L) 07/24/2023 1206   PROT 7.6 04/27/2014 1008   ALBUMIN 3.5 07/24/2023 1206   ALBUMIN 3.3 (L) 04/27/2014 1008    RADIOGRAPHIC STUDIES: CT Head Wo Contrast Result Date: 07/24/2023 CLINICAL DATA:  83 year old male with altered mental status, syncope, delirium, colon cancer. EXAM: CT HEAD WITHOUT CONTRAST TECHNIQUE: Contiguous axial images were obtained from the base of the skull through the vertex without intravenous contrast. RADIATION DOSE REDUCTION: This exam was performed according to the departmental dose-optimization program which includes automated exposure control,  adjustment of the mA and/or kV according to patient size and/or use of iterative reconstruction technique. COMPARISON:  No prior head CT or MRI. But PET-CT dating back to 04/02/2019. FINDINGS: Brain: Cerebral volume is within normal limits for age. No midline shift, ventriculomegaly, mass effect, evidence of mass lesion, intracranial hemorrhage or evidence of cortically based acute infarction. Gray-white differentiation is within normal limits for age. Vascular: No suspicious intracranial vascular hyperdensity. Calcified atherosclerosis at the skull base. There is trace gas within the cavernous sinus, which is nonspecific but most often is related to recent intravenous access. Skull: Chronic right lamina papyracea fracture, mild right orbital floor fracture. Chronic bony defect in the anterior floor of the sella turcica appears stable by PET-CT since at least 2020. Superimposed chronic partially empty sella. And this constellation may be related to a remote pituitary tumor resection. No acute or suspicious osseous lesion. Sinuses/Orbits: Paranasal sinuses, tympanic cavities and mastoids are well aerated. Other: Visualized orbits and scalp soft tissues are within normal limits. IMPRESSION: 1. No acute intracranial abnormality. 2. 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 Electronically Signed   By: Odessa Fleming M.D.   On: 07/24/2023 14:11    PERFORMANCE STATUS (ECOG) : 2 - Symptomatic, <50% confined to bed  Review  of Systems Unless otherwise noted, a complete review of systems is negative.  Physical Exam General: NAD Cardiovascular: regular rate and rhythm Pulmonary: clear ant fields Abdomen: soft, nontender, + bowel sounds GU: no suprapubic tenderness Extremities: no edema, no joint deformities Skin: no rashes Neurological: Weakness but otherwise  nonfocal  IMPRESSION/PLAN: Colon cancer -pending repeat CT.  Treatment currently on hold due to recent near syncopal events/dizziness  Dizziness -unclear etiology.  Patient has had extensive workup in emergency department and has been seen by cardiology and is pending placement of 14-day heart monitor.  If workup essentially normal, could consider referral to neurology.  Will proceed with supportive care and IV fluids today as oral intake is poor.  Labs/fluids next week  Patient expressed understanding and was in agreement with this plan. He also understands that He can call clinic at any time with any questions, concerns, or complaints.   Thank you for allowing me to participate in the care of this very pleasant patient.   Time Total: 20 minutes  Visit consisted of counseling and education dealing with the complex and emotionally intense issues of symptom management in the setting of serious illness.Greater than 50%  of this time was spent counseling and coordinating care related to the above assessment and plan.  Signed by: Laurette Schimke, PhD, NP-C

## 2023-08-06 ENCOUNTER — Ambulatory Visit
Admission: RE | Admit: 2023-08-06 | Discharge: 2023-08-06 | Disposition: A | Discharge status: Home / Self Care | Source: Ambulatory Visit | Attending: Oncology | Admitting: Oncology

## 2023-08-06 DIAGNOSIS — Z85038 Personal history of other malignant neoplasm of large intestine: Secondary | ICD-10-CM | POA: Insufficient documentation

## 2023-08-06 MED ORDER — IOHEXOL 300 MG/ML  SOLN
100.0000 mL | Freq: Once | INTRAMUSCULAR | Status: AC | PRN
  Administered 2023-08-06: 100 mL via INTRAVENOUS

## 2023-08-07 ENCOUNTER — Ambulatory Visit: Payer: Medicare Other

## 2023-08-07 ENCOUNTER — Other Ambulatory Visit: Payer: Medicare Other

## 2023-08-07 ENCOUNTER — Ambulatory Visit: Payer: Self-pay | Admitting: Urology

## 2023-08-08 ENCOUNTER — Other Ambulatory Visit: Payer: Medicare Other

## 2023-08-08 ENCOUNTER — Ambulatory Visit: Payer: Medicare Other | Admitting: Oncology

## 2023-08-08 ENCOUNTER — Ambulatory Visit: Payer: Medicare Other

## 2023-08-10 ENCOUNTER — Inpatient Hospital Stay

## 2023-08-13 ENCOUNTER — Encounter: Payer: Self-pay | Admitting: Radiation Oncology

## 2023-08-13 ENCOUNTER — Ambulatory Visit
Admission: RE | Admit: 2023-08-13 | Discharge: 2023-08-13 | Disposition: A | Discharge status: Home / Self Care | Source: Ambulatory Visit | Attending: Radiation Oncology | Admitting: Radiation Oncology

## 2023-08-13 ENCOUNTER — Other Ambulatory Visit: Payer: Self-pay | Admitting: *Deleted

## 2023-08-13 VITALS — BP 128/82 | HR 67 | Temp 97.9°F | Resp 12

## 2023-08-13 DIAGNOSIS — K5900 Constipation, unspecified: Secondary | ICD-10-CM | POA: Diagnosis not present

## 2023-08-13 DIAGNOSIS — C786 Secondary malignant neoplasm of retroperitoneum and peritoneum: Secondary | ICD-10-CM

## 2023-08-13 DIAGNOSIS — C7889 Secondary malignant neoplasm of other digestive organs: Secondary | ICD-10-CM | POA: Diagnosis present

## 2023-08-13 DIAGNOSIS — C184 Malignant neoplasm of transverse colon: Secondary | ICD-10-CM | POA: Diagnosis not present

## 2023-08-13 DIAGNOSIS — Z923 Personal history of irradiation: Secondary | ICD-10-CM | POA: Diagnosis not present

## 2023-08-13 MED ORDER — TAMSULOSIN HCL 0.4 MG PO CAPS: 0.4000 mg | ORAL_CAPSULE | Freq: Every day | ORAL | 0 refills | Status: DC

## 2023-08-13 NOTE — Progress Notes (Signed)
 Radiation Oncology Follow up Note  Name: Jared Gutierrez   Date:   08/13/2023 MRN:  [id number] DOB: 06/07/1940    This 83 y.o. male presents to the clinic today for 80-month follow-up status post involved field radiation therapy to an abdominal mesenteric mass and patient with known stage IV colon cancer.  REFERRING PROVIDER: Barbette Reichmann, MD  HPI: Patient is a 83 year old male now out for months having completed involved field radiation therapy for abdominal mesenteric mass with and patient with known stage IV colon cancer.    He is seen today in routine follow-up doing fairly well.  He tends towards constipation.  Have no abdominal complaints or pain.  Most recent imaging shows some slight progression in his chest otherwise good response of his abdominal region of treatment.  He has recently had some episodes of feeling dizzy and passing out.  This is being addressed by his other medical team. COMPLICATIONS OF TREATMENT: none  FOLLOW UP COMPLIANCE: keeps appointments   PHYSICAL EXAM:  BP 128/82   Pulse 67   Temp 97.9 F (36.6 C) (Tympanic)   Resp 12  Wheelchair-bound frail elderly gentleman in NAD.  Abdomen is benign.  Well-developed well-nourished patient in NAD. HEENT reveals PERLA, EOMI, discs not visualized.  Oral cavity is clear. No oral mucosal lesions are identified. Neck is clear without evidence of cervical or supraclavicular adenopathy. Lungs are clear to A&P. Cardiac examination is essentially unremarkable with regular rate and rhythm without murmur rub or thrill. Abdomen is benign with no organomegaly or masses noted. Motor sensory and DTR levels are equal and symmetric in the upper and lower extremities. Cranial nerves II through XII are grossly intact. Proprioception is intact. No peripheral adenopathy or edema is identified. No motor or sensory levels are noted. Crude visual fields are within normal range.  RADIOLOGY RESULTS: CT scans reviewed compatible with above-stated  findings  PLAN: At this time patient has had a good response to radiation therapy to his abdominal metastatic disease.  I am going to turn follow-up care over to medical oncology.  He continues close follow-up and treatment through the department.  I would be happy to reevaluate the patient in time should that be indicated.  Patient knows to call with any concerns.  I would like to take this opportunity to thank you for allowing me to participate in the care of your patient.Carmina Miller, MD

## 2023-08-14 ENCOUNTER — Inpatient Hospital Stay

## 2023-08-14 ENCOUNTER — Encounter: Payer: Self-pay | Admitting: Oncology

## 2023-08-14 ENCOUNTER — Inpatient Hospital Stay: Attending: Oncology

## 2023-08-14 ENCOUNTER — Inpatient Hospital Stay (HOSPITAL_BASED_OUTPATIENT_CLINIC_OR_DEPARTMENT_OTHER): Admitting: Oncology

## 2023-08-14 VITALS — BP 128/80 | HR 69 | Temp 97.2°F | Resp 18 | Wt 205.3 lb

## 2023-08-14 VITALS — BP 153/91 | HR 55 | Resp 16

## 2023-08-14 DIAGNOSIS — Z452 Encounter for adjustment and management of vascular access device: Secondary | ICD-10-CM | POA: Diagnosis not present

## 2023-08-14 DIAGNOSIS — C184 Malignant neoplasm of transverse colon: Secondary | ICD-10-CM

## 2023-08-14 DIAGNOSIS — Z809 Family history of malignant neoplasm, unspecified: Secondary | ICD-10-CM | POA: Insufficient documentation

## 2023-08-14 DIAGNOSIS — D6481 Anemia due to antineoplastic chemotherapy: Secondary | ICD-10-CM

## 2023-08-14 DIAGNOSIS — Z85038 Personal history of other malignant neoplasm of large intestine: Secondary | ICD-10-CM

## 2023-08-14 DIAGNOSIS — Z5111 Encounter for antineoplastic chemotherapy: Secondary | ICD-10-CM | POA: Insufficient documentation

## 2023-08-14 DIAGNOSIS — Z7982 Long term (current) use of aspirin: Secondary | ICD-10-CM | POA: Insufficient documentation

## 2023-08-14 DIAGNOSIS — Z803 Family history of malignant neoplasm of breast: Secondary | ICD-10-CM | POA: Insufficient documentation

## 2023-08-14 DIAGNOSIS — N390 Urinary tract infection, site not specified: Secondary | ICD-10-CM | POA: Insufficient documentation

## 2023-08-14 DIAGNOSIS — R918 Other nonspecific abnormal finding of lung field: Secondary | ICD-10-CM | POA: Insufficient documentation

## 2023-08-14 DIAGNOSIS — Z5112 Encounter for antineoplastic immunotherapy: Secondary | ICD-10-CM | POA: Diagnosis present

## 2023-08-14 DIAGNOSIS — Z95828 Presence of other vascular implants and grafts: Secondary | ICD-10-CM

## 2023-08-14 DIAGNOSIS — T451X5D Adverse effect of antineoplastic and immunosuppressive drugs, subsequent encounter: Secondary | ICD-10-CM | POA: Diagnosis not present

## 2023-08-14 DIAGNOSIS — E871 Hypo-osmolality and hyponatremia: Secondary | ICD-10-CM | POA: Insufficient documentation

## 2023-08-14 DIAGNOSIS — Z836 Family history of other diseases of the respiratory system: Secondary | ICD-10-CM | POA: Diagnosis not present

## 2023-08-14 DIAGNOSIS — Z79899 Other long term (current) drug therapy: Secondary | ICD-10-CM | POA: Insufficient documentation

## 2023-08-14 DIAGNOSIS — C786 Secondary malignant neoplasm of retroperitoneum and peritoneum: Secondary | ICD-10-CM

## 2023-08-14 DIAGNOSIS — Z860100 Personal history of colon polyps, unspecified: Secondary | ICD-10-CM | POA: Diagnosis not present

## 2023-08-14 DIAGNOSIS — R42 Dizziness and giddiness: Secondary | ICD-10-CM | POA: Insufficient documentation

## 2023-08-14 DIAGNOSIS — T451X5A Adverse effect of antineoplastic and immunosuppressive drugs, initial encounter: Secondary | ICD-10-CM

## 2023-08-14 LAB — CBC WITH DIFFERENTIAL/PLATELET
Abs Immature Granulocytes: 0.01 10*3/uL (ref 0.00–0.07)
Basophils Absolute: 0 10*3/uL (ref 0.0–0.1)
Basophils Relative: 1 %
Eosinophils Absolute: 0.1 10*3/uL (ref 0.0–0.5)
Eosinophils Relative: 3 %
HCT: 34.1 % — ABNORMAL LOW (ref 39.0–52.0)
Hemoglobin: 11.7 g/dL — ABNORMAL LOW (ref 13.0–17.0)
Immature Granulocytes: 0 %
Lymphocytes Relative: 23 %
Lymphs Abs: 0.9 10*3/uL (ref 0.7–4.0)
MCH: 31.6 pg (ref 26.0–34.0)
MCHC: 34.3 g/dL (ref 30.0–36.0)
MCV: 92.2 fL (ref 80.0–100.0)
Monocytes Absolute: 0.4 10*3/uL (ref 0.1–1.0)
Monocytes Relative: 12 %
Neutro Abs: 2.2 10*3/uL (ref 1.7–7.7)
Neutrophils Relative %: 61 %
Platelets: 206 10*3/uL (ref 150–400)
RBC: 3.7 MIL/uL — ABNORMAL LOW (ref 4.22–5.81)
RDW: 12.6 % (ref 11.5–15.5)
WBC: 3.6 10*3/uL — ABNORMAL LOW (ref 4.0–10.5)
nRBC: 0 % (ref 0.0–0.2)

## 2023-08-14 LAB — COMPREHENSIVE METABOLIC PANEL WITH GFR
ALT: 14 U/L (ref 0–44)
AST: 25 U/L (ref 15–41)
Albumin: 3.4 g/dL — ABNORMAL LOW (ref 3.5–5.0)
Alkaline Phosphatase: 51 U/L (ref 38–126)
Anion gap: 6 (ref 5–15)
BUN: 12 mg/dL (ref 8–23)
CO2: 24 mmol/L (ref 22–32)
Calcium: 9 mg/dL (ref 8.9–10.3)
Chloride: 99 mmol/L (ref 98–111)
Creatinine, Ser: 0.6 mg/dL — ABNORMAL LOW (ref 0.61–1.24)
GFR, Estimated: >60 mL/min (ref >60)
Glucose, Bld: 89 mg/dL (ref 70–99)
Potassium: 3.9 mmol/L (ref 3.5–5.1)
Sodium: 129 mmol/L — ABNORMAL LOW (ref 135–145)
Total Bilirubin: 0.9 mg/dL (ref 0.0–1.2)
Total Protein: 5.9 g/dL — ABNORMAL LOW (ref 6.5–8.1)

## 2023-08-14 LAB — SODIUM, URINE, RANDOM: Sodium, Ur: 76 mmol/L

## 2023-08-14 LAB — OSMOLALITY, URINE: Osmolality, Ur: 388 mosm/kg (ref 300–900)

## 2023-08-14 LAB — CORTISOL: Cortisol, Plasma: 9.1 ug/dL

## 2023-08-14 LAB — OSMOLALITY: Osmolality: 278 mosm/kg (ref 275–295)

## 2023-08-14 LAB — PROTEIN, URINE, RANDOM: Total Protein, Urine: 22 mg/dL

## 2023-08-14 MED ORDER — SODIUM CHLORIDE 0.9 % IV SOLN
INTRAVENOUS | Status: DC
  Filled 2023-08-14 (×2): qty 250

## 2023-08-14 MED ORDER — SODIUM CHLORIDE 0.9 % IV SOLN
400.0000 mg/m2 | Freq: Once | INTRAVENOUS | Status: AC
  Administered 2023-08-14: 860 mg via INTRAVENOUS
  Filled 2023-08-14: qty 43

## 2023-08-14 MED ORDER — SODIUM CHLORIDE 0.9 % IV SOLN
Freq: Once | INTRAVENOUS | Status: AC
  Filled 2023-08-14: qty 250

## 2023-08-14 MED ORDER — SODIUM CHLORIDE 0.9 % IV SOLN
2400.0000 mg/m2 | INTRAVENOUS | Status: DC
  Administered 2023-08-14: 5000 mg via INTRAVENOUS
  Filled 2023-08-14: qty 100

## 2023-08-14 MED ORDER — SODIUM CHLORIDE 0.9 % IV SOLN
5.0000 mg/kg | Freq: Once | INTRAVENOUS | Status: AC
  Administered 2023-08-14: 500 mg via INTRAVENOUS
  Filled 2023-08-14: qty 16

## 2023-08-14 MED ORDER — SODIUM CHLORIDE 0.9% FLUSH
10.0000 mL | Freq: Once | INTRAVENOUS | Status: AC
  Administered 2023-08-14: 10 mL via INTRAVENOUS
  Filled 2023-08-14: qty 10

## 2023-08-14 NOTE — Progress Notes (Signed)
 Nutrition Follow-up:  Patient with metastatic colon cancer.  S/p transverse colectomy (2015).  Patient fluorouracil and bevacizumab.  Met with patient during infusion.  Reports that his appetite is better and constipation is better.  Says that he is drinking ensure 2 times a day.  Yesterday ate chicken, corn and rice for supper.  Lunch was vegetable soup.  Ate nabs and coffee this am for breakfast.  Had a bowel movement last night.    Noted ER visit.    Medications: reviewed  Labs: Na 129  Anthropometrics:   Weight 205 lb 4.8 oz on 4/1  213 lb 8 oz on 1/7 206 lb 9.6 oz on 11/12 209 lb 14.4 oz on 10/15 215 lb on 10/1 238 lb on 3/5 243 lb on 10/31   NUTRITION DIAGNOSIS: Unintentional weight loss continues    INTERVENTION:  Continue bowel regimen to help constipation Discussed soft foods high in calories and protein (chicken salad, pimento cheese, peanut butter, yogurt, eggs)    MONITORING, EVALUATION, GOAL: weight trends, intake   NEXT VISIT: Tuesday, April 29 during infusion  Johnanna Bakke B. Elease Hashimoto, CSO, LDN Registered Dietitian [phone]

## 2023-08-14 NOTE — Assessment & Plan Note (Signed)
 Chemotherapy plan as listed above

## 2023-08-14 NOTE — Assessment & Plan Note (Signed)
 Check urine osm, serum osm, urine Na, cortisol.  Liberate salt intake.  IVF 500cc NS today

## 2023-08-14 NOTE — Assessment & Plan Note (Addendum)
 CT images shows slight lung progression -Finished palliative radiation on 03/05/2023. March 2025 CT scan shows stable disease.  Labs are reviewed and discussed with patient. Proceed with 5-FU and Bevavizumab

## 2023-08-14 NOTE — Assessment & Plan Note (Addendum)
 Stable  small lung nodule

## 2023-08-14 NOTE — Assessment & Plan Note (Signed)
 Stable continue to monitor

## 2023-08-14 NOTE — Patient Instructions (Signed)
 [facility] - A DEPT OF [facility]  Discharge Instructions: Thank you for choosing [facility] to provide your oncology and hematology care.  If you have a lab appointment with the Cancer Center, please go directly to the Cancer Center and check in at the registration area.  Wear comfortable clothing and clothing appropriate for easy access to any Portacath or PICC line.   We strive to give you quality time with your provider. You may need to reschedule your appointment if you arrive late (15 or more minutes).  Arriving late affects you and other patients whose appointments are after yours.  Also, if you miss three or more appointments without notifying the office, you may be dismissed from the clinic at the provider's discretion.      For prescription refill requests, have your pharmacy contact our office and allow 72 hours for refills to be completed.    Today you received the following chemotherapy and/or immunotherapy agents Vegzelma, Leucovorin, Adrucil      To help prevent nausea and vomiting after your treatment, we encourage you to take your nausea medication as directed.  BELOW ARE SYMPTOMS THAT SHOULD BE REPORTED IMMEDIATELY: *FEVER GREATER THAN 100.4 F (38 C) OR HIGHER *CHILLS OR SWEATING *NAUSEA AND VOMITING THAT IS NOT CONTROLLED WITH YOUR NAUSEA MEDICATION *UNUSUAL SHORTNESS OF BREATH *UNUSUAL BRUISING OR BLEEDING *URINARY PROBLEMS (pain or burning when urinating, or frequent urination) *BOWEL PROBLEMS (unusual diarrhea, constipation, pain near the anus) TENDERNESS IN MOUTH AND THROAT WITH OR WITHOUT PRESENCE OF ULCERS (sore throat, sores in mouth, or a toothache) UNUSUAL RASH, SWELLING OR PAIN  UNUSUAL VAGINAL DISCHARGE OR ITCHING   Items with * indicate a potential emergency and should be followed up as soon as possible or go to the Emergency Department if any problems should occur.  Please show the CHEMOTHERAPY ALERT CARD  or IMMUNOTHERAPY ALERT CARD at check-in to the Emergency Department and triage nurse.  Should you have questions after your visit or need to cancel or reschedule your appointment, please contact [facility] - A DEPT OF [facility]  [phone] and follow the prompts.  Office hours are 8:00 a.m. to 4:30 p.m. Monday - Friday. Please note that voicemails left after 4:00 p.m. may not be returned until the following business day.  We are closed weekends and major holidays. You have access to a nurse at all times for urgent questions. Please call the main number to the clinic [phone] and follow the prompts.  For any non-urgent questions, you may also contact your provider using MyChart. We now offer e-Visits for anyone 27 and older to request care online for non-urgent symptoms. For details visit [email or web address].   Also download the MyChart app! Go to the app store, search "MyChart", open the app, select [place], and log in with your MyChart username and password.

## 2023-08-14 NOTE — Progress Notes (Signed)
 Hematology/Oncology Progress note [phone] [phone]    Chief Complaint: Jared Gutierrez is a 83 y.o.  male presents for follow-up of metastatic colon cancer   ASSESSMENT & PLAN:   Cancer Staging  Cancer of transverse colon [facility]) Staging form: Colon and Rectum, [name], [name], [name], MD on 10/26/2021   Cancer of transverse colon [facility]) CT images shows slight lung progression -Finished palliative radiation on 03/05/2023. March 2025 CT scan shows stable disease.  Labs are reviewed and discussed with patient. Proceed with 5-FU and Bevavizumab   Anemia due to chemotherapy Stable continue to monitor.  Encounter for antineoplastic chemotherapy Chemotherapy plan as listed above.   Lung nodules Stable  small lung nodule  Hyponatremia Check urine osm, serum osm, urine Na, cortisol.  Liberate salt intake.  IVF 500cc NS today    Follow-up to be determined.    All questions were answered. The patient knows to call the clinic with any problems, questions or concerns.  [name], MD, PhD [facility] Hematology Oncology 08/14/2023     PERTINENT ONCOLOGY HISTORY Jared Gutierrez is a 83 y.o.[place] who has above oncology history reviewed by me today presented for follow up visit for management of Metastatic colon cancer. [name], patient switched care to me on 11/11/20 Extensive medical record review was performed by me  Oncology History  History of colon cancer, stage I  10/02/2014 Pathology Results   [id number] colonoscopy pathology A. CECAL POLYPS; HOT SNARE AND COLD BIOPSY:  - TUBULAR ADENOMA, MULTIPLE FRAGMENTS.  - NEGATIVE FOR HIGH-GRADE DYSPLASIA AND MALIGNANCY.   B. COLON POLYP, TRANSVERSE; COLD BIOPSY:  - TUBULAR ADENOMA.  - NEGATIVE FOR HIGH-GRADE DYSPLASIA AND MALIGNANCY.     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  Tumor extended into the submucosa. Margins were negative. + lymphovascular invasion. 14 lymph nodes were negative. Pathologic stage was T1 N0.  TRANSVERSE COLON, RESECTION:  - INVASIVE ADENOCARCINOMA ARISING IN A 4.6 CM TUBULOVILLOUS  ADENOMA WITH HIGH GRADE DYSPLASIA (MALIGNANT POLYP).  - SEE SUMMARY BELOW.  [name]  ONCOLOGY SUMMARY: COLON AND RECTUM, RESECTION, AJCC 7TH EDITION  Specimen: transverse colon  Procedure: transverse colon resection  Tumor site: splenic flexure  Tumor size: 1.0 cm (invasive component)  Macroscopic tumor perforation: not specified  Histologic type: invasive adenocarcinoma  Histologic grade: moderately differentiated  Microscopic tumor extension: into submucosa  Margins:       Proximal margin: negative       Distal margin: negative       Circumferential (radial) or mesenteric margin: negative       If all margins uninvolved by  invasive carcinoma:       Distance of invasive carcinoma from closest margin: 7.5 cm  to distal margin  Treatment effect: not applicable  Lymph-vascular invasion: present  Perineural invasion: not identified  Tumor deposits (discontinuous extramural extension): not  identified  Pathologic  staging:       Primary tumor: pT1       Regional lymph nodes: pN0            Number of nodes examined: 14            Number of nodes involved: 0    04/02/2019 Progression   04/02/2019  PET scan  limited evealed a 2.4 x 2.3 cm (SUV 11) hypermetabolic soft tissue density caudal and anterior to the pancreatic neck favored to represent isolated peritoneal or nodal metastasis in the setting of prior transverse colonic resection (expected primary drainage). Although this was immediately adjacent to the pancreas, a fat plane was maintained, arguing strongly against a pancreatic primary. Otherwise, there was no evidence of hypermetabolic metastasis. 04/17/2019 EUS on  revealed a normal esophagus, stomach, duodenum, and pancreas.  There was a 2.4 x 2.4 cm irregular mass in the retroperitoneum adjacent to, but not involving the pancreatic neck.  FNA and core needle biopsy were performed.  Pathology revealed adenocarcinoma compatible with a metastatic lesion of colorectal origin.  Tumor cells were positive for CK20 and CDX2 and negative for CK7.  NGS: [employer] on 05/15/2019 revealed + [id number] and TP53.  Negative results included BRAF V600E, Her2, NRAS, NTRK, PD-L1 (<1%), and TMB 8.7/Mb (intermediate).  MMR testing from his colon resection on 10/16/2013 was intact with a low probability of MSI-H.   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  He received Neulasta after cycle #4 and #5 secondary to progressive leukopenia.  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  He received a truncated course of FOLFOX with cycle #11 secondary to oxaliplatin reaction.   01/14/2020 Imaging    PET revealed an interval decrease in size and FDG uptake (2.4 x 2.3 cm with SUV 11 to 2.4 x 1.7 cm with SUV 4.09) associated with the previously referenced soft tissue density caudal  and anterior to the pancreatic neck suggesting treatment response. There were no new sites of FDG avid tumor.   08/31/2020 Imaging   08/31/2020 Abdomen and pelvis CT revealed increased size of the index soft tissue lesion inferior and anterior to the pancreatic neck (2.9 x 1.8 cm to 3.5 x 2.9 cm). There were similar prominent retroperitoneal lymph nodes without adenopathy by size criteria. There was no new or enlarging abdominal or pelvic lymph nodes. There were no new interval findings. There was similar circumferential wall thickening of a nondistended urinary bladder, which likely accentuated wall thickening There was hepatic steatosis and aortic atherosclerosis.   11/03/2020, PET showed recurrent peritoneal metastasis in the upper abdomen adjacent to the pancreas.  The lesion is 3.8 x 2.9 cm with SUV of 11.3. No evidence of metastatic peritoneal disease elsewhere in the abdomen pelvis.  No evidence of distant metastasis.   11/03/2020 Imaging   PET scan showed recurrent peritoneal metastasis near pancreas. Given that he has no other distant metastasis. Discussed with radiation oncology. Repeat SBRT may be considered if he does not respond well to systemic chemotherapy.     11/26/2020 -  Chemotherapy   5-FU  and bevacizumab.   02/17/2021 Imaging   02/17/2021 CT abdomen pelvis showed partial response. Mild decrease of peritoneal lesion.  Proceed with 5-FU and bevacizumab today.  Given that he is tolerating current regimen with good life quality, partial response.  Shared decision was made to hold off adding additional chemotherapy agents.   06/08/2021, CT chest abdomen pelvis with contrast showed soft tissue mass at the base of mesentery inferior to the pancreatic neck is unchanged in size.  No progressive disease was identified.   09/07/2021, CT chest abdomen pelvis with contrast showed no substantial changes in size of the mesenteric mass, 2.6 cm.  No suspicious new lesions.  Chronic findings as  detailed in the imaging report.   12/05/2021 Imaging   PET scan showed soft tissue mesenteric mass is unchanged in size.  Decreased FDG uptake compared to activity on November 03, 2020.  Fever treated disease.  No new metastatic disease.   03/21/2022 Imaging   CT chest abdomen pelvis  1. Similar size of the peripancreatic presumed peritoneal implant compared to 09/07/2021. 2. No new or progressive disease. 3. Incidental findings, including: Coronary artery atherosclerosis.Aortic Atherosclerosis (ICD10-I70.0). Prostatomegaly with chronic bladder wall thickening, suggesting outlet obstruction.     07/11/2022 Imaging   CT chest abdomen pelvis with contrast showed 1. New 6 mm nodule in the right upper lobe and 2 mm nodule in the left upper lobe. Although small these are concerning. Recommend short follow-up in 3 months 2. Stable low-attenuation lesion along the midbody of the pancreas. Continued attention on follow-up. No new peritoneal mass lesions or ascites. 3. Fatty liver infiltration 4. Colonic surgical changes   10/03/2022 Imaging   CT chest abdomen pelvis w contrast showed 1. Slight interval enlargement and cavitation of a nodule in the right upper lobe, measuring 0.6 cm, previously 0.5 cm. Slight interval enlargement of a left upper lobe nodule measuring 0.3 cm,previously 0.2 cm. Findings are concerning for slowly enlarging small pulmonary metastases. 2. No significant change in a lobulated soft tissue mass within the central small bowel mesentery abutting the inferior aspect of the pancreas, consistent with a stable, treated metastasis. 3. No other evidence of lymphadenopathy or metastatic disease in the chest, abdomen, or pelvis. 4. Prostatomegaly with diffuse wall thickening of the relatively decompressed urinary bladder, likely secondary to chronic outlet obstruction. 5. Unchanged fibrotic scarring and bronchiectasis of the bilateral lung bases. This could be further assessed by pulmonary  referral and dedicated interstitial lung disease protocol CT examination of the chest if and when clinically appropriate in the setting of known metastatic malignancy. Aortic Atherosclerosis    12/27/2022 Imaging   CT chest abdomen pelvis w contrast showed 1. Continued slight interval enlargement of a cavitary nodule in the right upper lobe measuring 0.7 x 0.6 cm, as well as a nodule in the peripheral left upper lobe measuring 0.5 cm. These are highly suspicious for slowly enlarging pulmonary metastases but remain below size threshold for reliable PET-CT characterization. 2. Unchanged, lobulated hypodense soft tissue mass in the central small bowel mesentery, consistent with treated metastatic disease. 3. Status post transverse colon resection and reanastomosis. 4. Thickening of the distal sigmoid colon and rectum, consistent with nonspecific infectious or inflammatory colitis. 5. Prostatomegaly with diffuse thickening of the urinary bladder wall, likely secondary to chronic outlet obstruction.   Aortic Atherosclerosis (ICD10-I70.0).   01/09/2023 Imaging   PET scan showed Status post transverse colectomy. 4.2 cm soft tissue mass in the central abdominal mesentery, suspicious for residual/recurrent nodal metastasis. Additional  8 mm short axis left para-aortic nodal metastasis. 7 mm cavitary right upper lobe nodule and 5 mm left upper lobe nodule, both beneath the size threshold for PET sensitivity, but suspicious for small pulmonary metastases.   01/30/2023 -  Radiation Therapy   Palliative radiation to mesenteric soft tissue mass.    05/07/2023 Imaging   CT chest abdomen pelvis w contrast showed  Focal nodular area along the inferior margin of the midbody of the pancreas similar to previous. There is also some abnormal soft tissue extending into the porta hepatis encasing the hepatic artery which compared to previous is also slightly increased.   New small soft tissue nodule adjacent to  the SMA proximally.   Previous hypermetabolic node left para-aortic is similar in size to previous.   Stable bilateral small lung nodules. Right lesions again cavitary. No new lung nodules.   Stable surgical changes.  Chest port.     Imaging     08/06/2023 Imaging   CT chest abdomen pelvis w contrast showed Small nodules are again seen. These appears slightly smaller overall from previous. No new lung nodule identified at this time.   Lesion along the inferior margin of the midbody of the pancreas similar to the prior examination. Persistent ill-defined soft tissue extending along the porta hepatis encasing the course of the [facility] artery. Small lymph nodes elsewhere in the upper retroperitoneum are stable.      Other medical problems Chronic lower extremity edema. 12/24/2018, right lower extremity duplex negative for DVT.  Small right Baker's cyst. 08/16/2019, bilateral lower extremity duplex showed no DVT. 08/16/2019 - 08/17/2019 with right lower extremity cellulitis.  He was unable to bear weight.  He was treated with IV fluids, NSAIDs, colchicine, and broad antibiotics (vancomycin and Cefepime).  He was discharged on indomethacin x 5 days and Keflex 500 mg TID x 5 days.  10/05/2020, colonoscopy showed internal hemorrhoids.  Otherwise normal examination.  INTERVAL HISTORY Jared Gutierrez is a 83 y.o. male who has above history reviewed by me today presents for follow up visit for management of recurrent metastatic colon cancer Patient was accompanied by wife He reports mild nausea this morning, currently nausea has resolved.  Wife attributes to him not having enough time for breakfast.   Review of Systems  Constitutional:  Negative for appetite change, chills, diaphoresis, fever and unexpected weight change.  HENT:   Negative for hearing loss, nosebleeds, sore throat and tinnitus.   Respiratory:  Negative for cough, hemoptysis and shortness of breath.   Cardiovascular:   Negative for chest pain and palpitations.  Gastrointestinal:  Positive for nausea. Negative for abdominal pain, blood in stool, constipation, diarrhea and vomiting.  Genitourinary:  Negative for dysuria, frequency and hematuria.   Musculoskeletal:  Positive for arthralgias. Negative for back pain, myalgias and neck pain.  Skin:  Negative for itching and rash.  Neurological:  Positive for dizziness and numbness. Negative for headaches.  Hematological:  Does not bruise/bleed easily.  Psychiatric/Behavioral:  Negative for depression. The patient is not nervous/anxious.       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   Chicken pox    Colon cancer (HCC)    History of kidney stones    Hypertension    Lipoma of colon    Nephrolithiasis    Nephrolithiasis    Obesity    Shingles  Tubular adenoma of colon    multiple fragments    Past Surgical History:  Procedure Laterality Date   COLON SURGERY     COLONOSCOPY N/A 10/02/2014   Procedure: COLONOSCOPY;  Surgeon: Elnita Maxwell, MD;  Location: [facility] ENDOSCOPY;  Service: Endoscopy;  Laterality: N/A;   COLONOSCOPY WITH PROPOFOL N/A 02/16/2017   Procedure: COLONOSCOPY WITH PROPOFOL;  Surgeon: Scot Jun, MD;  Location[facility] ENDOSCOPY;  Service: Endoscopy;  Laterality: N/A;   COLONOSCOPY WITH PROPOFOL N/A 10/05/2020   Procedure: COLONOSCOPY WITH PROPOFOL;  Surgeon: Regis Bill, MD;  Location: [facility] ENDOSCOPY;  Service: Endoscopy;  Laterality: N/A;   EUS N/A 04/17/2019   Procedure: FULL UPPER ENDOSCOPIC ULTRASOUND (EUS) RADIAL;  Surgeon: Bearl Mulberry, MD;  Location: [facility] ENDOSCOPY;  Service: Gastroenterology;  Laterality: N/A;   KIDNEY STONE SURGERY     PARTIAL COLECTOMY  10/17/2013   PORTACATH PLACEMENT Right 06/13/2019   Procedure: INSERTION PORT-A-CATH;  Surgeon: Sung Amabile, DO;  Location: [facility] ORS;  Service: General;   Laterality: Right;    Family History  Problem Relation Age of Onset   Cancer Mother    Breast cancer Mother    COPD Father     Social History:  reports that he has never smoked. He has never used smokeless tobacco. He reports current alcohol use. He reports that he does not use drugs.    Allergies: No Known Allergies  Current Medications: Current Outpatient Medications  Medication Sig Dispense Refill   acetaminophen (TYLENOL) 500 MG tablet Take 500 mg by mouth every 6 (six) hours as needed.     amLODipine (NORVASC) 2.5 MG tablet Take 2.5 mg by mouth daily.     aspirin EC 81 MG tablet Take 81 mg by mouth daily.     Cholecalciferol (VITAMIN D) 125 MCG (5000 UT) CAPS Take 5,000 mg by mouth.     cyanocobalamin (VITAMIN B12) 500 MCG tablet Take by mouth.     docusate sodium (COLACE) 100 MG capsule Take 1 capsule (100 mg total) by mouth 2 (two) times daily as needed for mild constipation or moderate constipation. 60 capsule 3   Ferrous Fumarate (HEMOCYTE - 106 MG FE) 324 (106 Fe) MG TABS tablet Take 1 tablet by mouth.     loperamide (IMODIUM) 2 MG capsule Take 1 capsule (2 mg total) by mouth See admin instructions. Take 2 tablets after first loose stool,  then 1 tablet  after each loose stool; maximum: 8 tablets /day 60 capsule 0   loratadine (CLARITIN) 10 MG tablet Take 10 mg by mouth daily.     potassium chloride SA (KLOR-CON M20) 20 MEQ tablet Take 1 tablet (20 mEq total) by mouth daily. 180 tablet 0   Probiotic Product (PROBIOTIC DAILY PO) Take 1 tablet by mouth daily.     prochlorperazine (COMPAZINE) 10 MG tablet Take 1 tablet (10 mg total) by mouth every 6 (six) hours as needed (NAUSEA). 30 tablet 1   senna-docusate (SENNA S) 8.6-50 MG tablet Take 2 tablets by mouth daily. 60 tablet 3   tamsulosin (FLOMAX) 0.4 MG CAPS capsule Take 1 capsule (0.4 mg total) by mouth daily after supper. 30 capsule 0   Calcium Carbonate (CALCIUM 600 PO) Take 1 tablet by mouth 2 (two) times daily. (Patient  not taking: Reported on 08/14/2023)     furosemide (LASIX) 20 MG tablet Take 20 mg by mouth as needed. Prn for swelling (Patient not taking: Reported on 08/03/2023)     HYDROcodone-acetaminophen (NORCO/VICODIN) 5-325 MG  tablet Take 1 tablet by mouth every 6 (six) hours as needed for moderate pain (up to 3 doses for moderate pain.). (Patient not taking: Reported on 08/14/2023)     lidocaine-prilocaine (EMLA) cream APPLY TO AFFECTED AREA ONCE AS DIRECTED (Patient not taking: Reported on 08/14/2023) 30 g 3   megestrol (MEGACE) 40 MG tablet TAKE 2 TABLETS BY MOUTH 2 TIMES DAILY. (Patient not taking: Reported on 08/14/2023) 360 tablet 1   olmesartan (BENICAR) 20 MG tablet Take 1 tablet by mouth daily. (Patient not taking: Reported on 08/03/2023)     ondansetron (ZOFRAN) 8 MG tablet Take 1 tablet (8 mg total) by mouth 2 (two) times daily as needed for refractory nausea / vomiting. Start on day 3 after chemotherapy. (Patient not taking: Reported on 08/14/2023) 60 tablet 1   No current facility-administered medications for this visit.   Facility-Administered Medications Ordered in Other Visits  Medication Dose Route Frequency Provider Last Rate Last Admin   0.9 %  sodium chloride infusion   Intravenous Once Corcoran, Melissa C, MD       0.9 %  sodium chloride infusion   Intravenous Continuous Merlene Pulling, Melissa C, MD 10 mL/hr at 12/31/19 1000 New Bag at 10/05/20 1045   0.9 %  sodium chloride infusion   Intravenous Continuous [name], MD   Stopped at 08/14/23 1015   fluorouracil (ADRUCIL) 5,000 mg in sodium chloride 0.9 % 150 mL chemo infusion  2,400 mg/m2 (Order-Specific) Intravenous 1 day or 1 dose [name], MD   Infusion Verify at 08/14/23 1142   heparin lock flush 100 unit/mL  500 Units Intravenous Once Corcoran, Melissa C, MD       sodium chloride flush (NS) 0.9 % injection 10 mL  10 mL Intracatheter PRN [name], MD   10 mL at 02/01/23 1341    Performance status (ECOG): 1  Vitals Blood pressure 128/80, pulse  69, temperature (!) 97.2 F (36.2 C), temperature source Tympanic, resp. rate 18, weight 205 lb 4.8 oz (93.1 kg), SpO2 100%.   Physical Exam Constitutional:      General: He is not in acute distress.    Appearance: He is obese. He is not diaphoretic.  HENT:     Head: Normocephalic and atraumatic.  Eyes:     General: No scleral icterus. Cardiovascular:     Rate and Rhythm: Normal rate and regular rhythm.     Heart sounds: No murmur heard. Pulmonary:     Effort: Pulmonary effort is normal. No respiratory distress.     Breath sounds: No wheezing.     Comments: Decreased breath sound bilaterally.  Abdominal:     General: Bowel sounds are normal. There is no distension.     Palpations: Abdomen is soft.  Musculoskeletal:        General: Normal range of motion.     Cervical back: Normal range of motion and neck supple.     Comments: Trace edema bilaterally  Skin:    General: Skin is warm and dry.     Findings: No erythema.  Neurological:     Mental Status: He is alert and oriented to person, place, and time. Mental status is at baseline.     Motor: No abnormal muscle tone.  Psychiatric:        Mood and Affect: Mood and affect normal.     Labs were reviewed by me.     Latest Ref Rng & Units 08/14/2023    8:06 AM 08/03/2023  8:45 AM 07/24/2023   12:06 PM  CBC  WBC 4.0 - 10.5 K/uL 3.6  3.8  4.7   Hemoglobin 13.0 - 17.0 g/dL 16.1  09.6  04.5   Hematocrit 39.0 - 52.0 % 34.1  33.5  38.3   Platelets 150 - 400 K/uL 206  189  178       Latest Ref Rng & Units 08/14/2023    8:06 AM 08/03/2023    8:45 AM 07/24/2023   12:06 PM  CMP  Glucose 70 - 99 mg/dL 89  409  811   BUN 8 - 23 mg/dL 12  11  16    Creatinine 0.61 - 1.24 mg/dL 9.14  7.82  9.56   Sodium 135 - 145 mmol/L 129  130  135   Potassium 3.5 - 5.1 mmol/L 3.9  4.0  3.7   Chloride 98 - 111 mmol/L 99  99  101   CO2 22 - 32 mmol/L 24  24  28    Calcium 8.9 - 10.3 mg/dL 9.0  9.1  9.3   Total Protein 6.5 - 8.1 g/dL 5.9  5.6  6.2    Total Bilirubin 0.0 - 1.2 mg/dL 0.9  0.6  0.8   Alkaline Phos 38 - 126 U/L 51  49  48   AST 15 - 41 U/L 25  29  29    ALT 0 - 44 U/L 14  17  16

## 2023-08-16 ENCOUNTER — Inpatient Hospital Stay

## 2023-08-16 VITALS — BP 155/90 | HR 62 | Temp 97.2°F | Resp 18

## 2023-08-16 DIAGNOSIS — Z85038 Personal history of other malignant neoplasm of large intestine: Secondary | ICD-10-CM

## 2023-08-16 DIAGNOSIS — C786 Secondary malignant neoplasm of retroperitoneum and peritoneum: Secondary | ICD-10-CM

## 2023-08-16 DIAGNOSIS — Z5112 Encounter for antineoplastic immunotherapy: Secondary | ICD-10-CM | POA: Diagnosis not present

## 2023-08-16 MED ORDER — HEPARIN SOD (PORK) LOCK FLUSH 100 UNIT/ML IV SOLN
500.0000 [IU] | Freq: Once | INTRAVENOUS | Status: AC | PRN
  Administered 2023-08-16: 500 [IU]
  Filled 2023-08-16: qty 5

## 2023-08-16 MED ORDER — SODIUM CHLORIDE 0.9% FLUSH
10.0000 mL | INTRAVENOUS | Status: DC | PRN
  Administered 2023-08-16: 10 mL
  Filled 2023-08-16: qty 10

## 2023-08-21 ENCOUNTER — Ambulatory Visit: Payer: Medicare Other

## 2023-08-21 ENCOUNTER — Other Ambulatory Visit: Payer: Medicare Other

## 2023-08-21 ENCOUNTER — Ambulatory Visit: Payer: Medicare Other | Admitting: Oncology

## 2023-08-27 ENCOUNTER — Emergency Department
Admission: EM | Admit: 2023-08-27 | Discharge: 2023-08-27 | Disposition: A | Discharge status: Home / Self Care | Attending: Emergency Medicine | Admitting: Emergency Medicine

## 2023-08-27 ENCOUNTER — Other Ambulatory Visit: Payer: Self-pay

## 2023-08-27 ENCOUNTER — Emergency Department

## 2023-08-27 DIAGNOSIS — R531 Weakness: Secondary | ICD-10-CM | POA: Diagnosis present

## 2023-08-27 DIAGNOSIS — C786 Secondary malignant neoplasm of retroperitoneum and peritoneum: Secondary | ICD-10-CM

## 2023-08-27 DIAGNOSIS — N390 Urinary tract infection, site not specified: Secondary | ICD-10-CM

## 2023-08-27 DIAGNOSIS — Z85038 Personal history of other malignant neoplasm of large intestine: Secondary | ICD-10-CM | POA: Diagnosis not present

## 2023-08-27 LAB — URINALYSIS, ROUTINE W REFLEX MICROSCOPIC
Bilirubin Urine: NEGATIVE
Glucose, UA: NEGATIVE mg/dL
Hgb urine dipstick: NEGATIVE
Ketones, ur: NEGATIVE mg/dL
Nitrite: NEGATIVE
Protein, ur: 100 mg/dL — AB
Specific Gravity, Urine: 1.017 (ref 1.005–1.030)
WBC, UA: >50 WBC/hpf (ref 0–5)
pH: 5 (ref 5.0–8.0)

## 2023-08-27 LAB — COMPREHENSIVE METABOLIC PANEL WITH GFR
ALT: 13 U/L (ref 0–44)
AST: 37 U/L (ref 15–41)
Albumin: 3.4 g/dL — ABNORMAL LOW (ref 3.5–5.0)
Alkaline Phosphatase: 44 U/L (ref 38–126)
Anion gap: 10 (ref 5–15)
BUN: 12 mg/dL (ref 8–23)
CO2: 25 mmol/L (ref 22–32)
Calcium: 9 mg/dL (ref 8.9–10.3)
Chloride: 101 mmol/L (ref 98–111)
Creatinine, Ser: 0.96 mg/dL (ref 0.61–1.24)
GFR, Estimated: >60 mL/min (ref >60)
Glucose, Bld: 147 mg/dL — ABNORMAL HIGH (ref 70–99)
Potassium: 4.3 mmol/L (ref 3.5–5.1)
Sodium: 136 mmol/L (ref 135–145)
Total Bilirubin: 1.3 mg/dL — ABNORMAL HIGH (ref 0.0–1.2)
Total Protein: 5.9 g/dL — ABNORMAL LOW (ref 6.5–8.1)

## 2023-08-27 LAB — CBC WITH DIFFERENTIAL/PLATELET
Abs Immature Granulocytes: 0.01 10*3/uL (ref 0.00–0.07)
Basophils Absolute: 0 10*3/uL (ref 0.0–0.1)
Basophils Relative: 1 %
Eosinophils Absolute: 0.1 10*3/uL (ref 0.0–0.5)
Eosinophils Relative: 2 %
HCT: 34.6 % — ABNORMAL LOW (ref 39.0–52.0)
Hemoglobin: 11.5 g/dL — ABNORMAL LOW (ref 13.0–17.0)
Immature Granulocytes: 0 %
Lymphocytes Relative: 18 %
Lymphs Abs: 0.6 10*3/uL — ABNORMAL LOW (ref 0.7–4.0)
MCH: 31.7 pg (ref 26.0–34.0)
MCHC: 33.2 g/dL (ref 30.0–36.0)
MCV: 95.3 fL (ref 80.0–100.0)
Monocytes Absolute: 0.5 10*3/uL (ref 0.1–1.0)
Monocytes Relative: 15 %
Neutro Abs: 2 10*3/uL (ref 1.7–7.7)
Neutrophils Relative %: 64 %
Platelets: 161 10*3/uL (ref 150–400)
RBC: 3.63 MIL/uL — ABNORMAL LOW (ref 4.22–5.81)
RDW: 13.1 % (ref 11.5–15.5)
WBC: 3.1 10*3/uL — ABNORMAL LOW (ref 4.0–10.5)
nRBC: 0 % (ref 0.0–0.2)

## 2023-08-27 LAB — RESP PANEL BY RT-PCR (RSV, FLU A&B, COVID)  RVPGX2
Influenza A by PCR: NEGATIVE
Influenza B by PCR: NEGATIVE
Resp Syncytial Virus by PCR: NEGATIVE
SARS Coronavirus 2 by RT PCR: NEGATIVE

## 2023-08-27 LAB — TROPONIN I (HIGH SENSITIVITY)
Troponin I (High Sensitivity): 4 ng/L (ref <18)
Troponin I (High Sensitivity): 5 ng/L (ref <18)

## 2023-08-27 MED ORDER — CEFDINIR 300 MG PO CAPS: 300.0000 mg | ORAL_CAPSULE | Freq: Two times a day (BID) | ORAL | 0 refills | Status: AC

## 2023-08-27 MED ORDER — LIDOCAINE-PRILOCAINE 2.5-2.5 % EX CREA: TOPICAL_CREAM | CUTANEOUS | 3 refills | Status: DC

## 2023-08-27 MED ORDER — SODIUM CHLORIDE 0.9 % IV SOLN
1.0000 g | Freq: Once | INTRAVENOUS | Status: AC
  Administered 2023-08-27: 1 g via INTRAVENOUS
  Filled 2023-08-27: qty 10

## 2023-08-27 MED ORDER — SODIUM CHLORIDE 0.9 % IV BOLUS
1000.0000 mL | Freq: Once | INTRAVENOUS | Status: AC
  Administered 2023-08-27: 1000 mL via INTRAVENOUS

## 2023-08-27 NOTE — Discharge Instructions (Signed)
 Please take the antibiotics as prescribed for your urinary tract infection.  Please make sure to keep yourself hydrated.

## 2023-08-27 NOTE — ED Provider Notes (Signed)
 [facility] Provider Note    Event Date/Time   First MD Initiated Contact with Patient 08/27/23 1406     (approximate)   History   No chief complaint on file.   HPI  Jared Gutierrez is a 83 y.o. male with a history of colon cancer who comes in with weakness.  Patient has some weakness that started today.  When he got there his blood pressure was initially 70/30 and received 500 cc of fluid.  Patient denies any symptoms.  He denies any known falls.   Physical Exam   Triage Vital Signs: ED Triage Vitals  Encounter Vitals Group     BP      Systolic BP Percentile      Diastolic BP Percentile      Pulse      Resp      Temp      Temp src      SpO2      Weight      Height      Head Circumference      Peak Flow      Pain Score      Pain Loc      Pain Education      Exclude from Growth Chart     Most recent vital signs: Vitals:   08/27/23 1417  BP: 103/63  Pulse: 71  Resp: 14  Temp: 97.9 F (36.6 C)  SpO2: 100%     General: Awake, no distress.  CV:  Good peripheral perfusion.  Resp:  Normal effort.  Abd:  No distention.  Other:  Alert and oriented x 2 equal strength in arms and legs.  Cranial nerves appear intact.  Abdomen soft and nontender   ED Results / Procedures / Treatments   Labs (all labs ordered are listed, but only abnormal results are displayed) Labs Reviewed  RESP PANEL BY RT-PCR (RSV, FLU A&B, COVID)  [id number]  CBC WITH DIFFERENTIAL/PLATELET  COMPREHENSIVE METABOLIC PANEL WITH GFR  URINALYSIS, ROUTINE W REFLEX MICROSCOPIC  TROPONIN I (HIGH SENSITIVITY)     EKG  My interpretation of EKG:  Sinus rate of 71 without any ST elevation or T wave inversions, bifascicular block  RADIOLOGY Pending    PROCEDURES:  Critical Care performed: No  .1-3 Lead EKG Interpretation  Performed by: Concha Se, MD Authorized by: Concha Se, MD     Interpretation: normal     ECG rate:  70   ECG rate assessment:  normal     Rhythm: sinus rhythm     Ectopy: none     Conduction: normal      MEDICATIONS ORDERED IN ED: Medications  sodium chloride 0.9 % bolus 1,000 mL (has no administration in time range)     IMPRESSION / MDM / ASSESSMENT AND PLAN / ED COURSE  I reviewed the triage vital signs and the nursing notes.   Patient's presentation is most consistent with acute presentation with potential threat to life or bodily function.   Patient comes in weakness, hypotension.  I reviewed the notes on 08/14/2023 where patient is currently on chemotherapy for colon cancer.  Await labs to evaluate for AKI, electrolyte abnormalities, CT imaging to evaluate for any type of intracranial mass.  Will give an additional 1 L of fluid due to some borderline low blood pressures.  Patient be handed off to oncoming team pending blood work, CT imaging  The patient is on the cardiac monitor to evaluate for evidence  of arrhythmia and/or significant heart rate changes.      FINAL CLINICAL IMPRESSION(S) / ED DIAGNOSES   Final diagnoses:  Weakness     Rx / DC Orders   ED Discharge Orders     None        Note:  This document was prepared using Dragon voice recognition software and may include unintentional dictation errors.   Lubertha Rush, MD 08/27/23 1440

## 2023-08-27 NOTE — ED Triage Notes (Signed)
 Pt arrives via [facility] from home with c/o weakness that started this morning. When EMS initially checked pt's VS, bp was 70/30 and after receiving 500mL bolus of NS pt's BP was 133/11 but EMS was unsure of accuracy. Pt is A&Ox3 during triage. Pt denies CP, N/V/D, nor SOB. Pt is in NAD at this time.

## 2023-08-27 NOTE — ED Notes (Signed)
 Pt ambulated to BR with walker, no difficulty noted or expressed

## 2023-08-27 NOTE — ED Notes (Signed)
 Pt to CT

## 2023-08-27 NOTE — ED Notes (Signed)
 Called [facility] and added pt to their monitoring.

## 2023-08-27 NOTE — ED Provider Notes (Signed)
.-----------------------------------------   3:24 PM on 08/27/2023 -----------------------------------------  Blood pressure 103/63, pulse 71, temperature 97.9 F (36.6 C), temperature source Oral, resp. rate 14, height 5\' 6"  (1.676 m), SpO2 100%.  Assuming care from Dr. Peggi Bowels.  In short, Jared Gutierrez is a 83 y.o. male with a chief complaint of Weakness .  Refer to the original H&P for additional details.  The current plan of care is to follow up labs, UA, CT head, reassessment.  Independent review of labs imaging are below.  Patient is able to ambulate with a walker without difficulty. shared decision making with patient and family earlier and they are agreeable with plan for discharge since he is able to ambulate.  They feel safe taking him home, will give him a course of cefdinir for his UTI.  Family will call his oncologist tomorrow to figure out if he can go to chemo tomorrow.  Otherwise instructed him to follow-up with his primary care doctor in 3 to 4 days to get reassessed.  Otherwise considered but no indication for inpatient admission at this time especially since he is back to his mental baseline is ambulatory.  He is safe for outpatient management.  Discharged with strict return precautions.  Clinical Course as of 08/27/23 2042  Mon Aug 27, 2023  6213 Independent review of labs, troponin x 2 is negative, electrolytes not severely deranged, creatinine is normal, UA is consistent with UTI, respiratory viral panel is negative, mild leukopenia noted.  Will start him on ceftriaxone for the UTI. [TT]  1928 CT HEAD WO CONTRAST ( ) No acute intracranial abnormality noted.  [TT]  1928 On reassessment, per family, patient is back to his mental baseline, he is AxO x 3 at this time, discussed with patient and family about imaging and lab results.  Will get him up to ambulate, if he is too weak to walk, will plan to admit for generalized weakness as well as a UTI. [TT]  2041 Patient was able  to ambulate with the walker without difficulty. [TT]    Clinical Course User Index [TT] Shane Darling, MD      Shane Darling, MD 08/27/23 [phone]

## 2023-08-28 ENCOUNTER — Inpatient Hospital Stay (HOSPITAL_BASED_OUTPATIENT_CLINIC_OR_DEPARTMENT_OTHER): Admitting: Oncology

## 2023-08-28 ENCOUNTER — Inpatient Hospital Stay

## 2023-08-28 ENCOUNTER — Encounter: Payer: Self-pay | Admitting: Oncology

## 2023-08-28 VITALS — BP 109/52 | HR 75 | Temp 97.8°F | Resp 20 | Wt 201.7 lb

## 2023-08-28 DIAGNOSIS — N3000 Acute cystitis without hematuria: Secondary | ICD-10-CM | POA: Diagnosis not present

## 2023-08-28 DIAGNOSIS — Z5112 Encounter for antineoplastic immunotherapy: Secondary | ICD-10-CM | POA: Diagnosis not present

## 2023-08-28 DIAGNOSIS — N390 Urinary tract infection, site not specified: Secondary | ICD-10-CM | POA: Insufficient documentation

## 2023-08-28 DIAGNOSIS — Z85038 Personal history of other malignant neoplasm of large intestine: Secondary | ICD-10-CM

## 2023-08-28 DIAGNOSIS — C786 Secondary malignant neoplasm of retroperitoneum and peritoneum: Secondary | ICD-10-CM

## 2023-08-28 DIAGNOSIS — C184 Malignant neoplasm of transverse colon: Secondary | ICD-10-CM

## 2023-08-28 LAB — COMPREHENSIVE METABOLIC PANEL WITH GFR
ALT: 15 U/L (ref 0–44)
AST: 28 U/L (ref 15–41)
Albumin: 3.3 g/dL — ABNORMAL LOW (ref 3.5–5.0)
Alkaline Phosphatase: 49 U/L (ref 38–126)
Anion gap: 8 (ref 5–15)
BUN: 13 mg/dL (ref 8–23)
CO2: 24 mmol/L (ref 22–32)
Calcium: 9 mg/dL (ref 8.9–10.3)
Chloride: 105 mmol/L (ref 98–111)
Creatinine, Ser: 0.59 mg/dL — ABNORMAL LOW (ref 0.61–1.24)
GFR, Estimated: >60 mL/min (ref >60)
Glucose, Bld: 103 mg/dL — ABNORMAL HIGH (ref 70–99)
Potassium: 4 mmol/L (ref 3.5–5.1)
Sodium: 137 mmol/L (ref 135–145)
Total Bilirubin: 0.8 mg/dL (ref 0.0–1.2)
Total Protein: 5.8 g/dL — ABNORMAL LOW (ref 6.5–8.1)

## 2023-08-28 LAB — CBC WITH DIFFERENTIAL/PLATELET
Abs Immature Granulocytes: 0.01 10*3/uL (ref 0.00–0.07)
Basophils Absolute: 0 10*3/uL (ref 0.0–0.1)
Basophils Relative: 1 %
Eosinophils Absolute: 0.1 10*3/uL (ref 0.0–0.5)
Eosinophils Relative: 3 %
HCT: 32.2 % — ABNORMAL LOW (ref 39.0–52.0)
Hemoglobin: 10.8 g/dL — ABNORMAL LOW (ref 13.0–17.0)
Immature Granulocytes: 0 %
Lymphocytes Relative: 19 %
Lymphs Abs: 0.8 10*3/uL (ref 0.7–4.0)
MCH: 31.6 pg (ref 26.0–34.0)
MCHC: 33.5 g/dL (ref 30.0–36.0)
MCV: 94.2 fL (ref 80.0–100.0)
Monocytes Absolute: 0.4 10*3/uL (ref 0.1–1.0)
Monocytes Relative: 10 %
Neutro Abs: 2.7 10*3/uL (ref 1.7–7.7)
Neutrophils Relative %: 67 %
Platelets: 174 10*3/uL (ref 150–400)
RBC: 3.42 MIL/uL — ABNORMAL LOW (ref 4.22–5.81)
RDW: 13.2 % (ref 11.5–15.5)
WBC: 4 10*3/uL (ref 4.0–10.5)
nRBC: 0 % (ref 0.0–0.2)

## 2023-08-28 LAB — PROTEIN, URINE, RANDOM: Total Protein, Urine: 30 mg/dL

## 2023-08-28 MED ORDER — HEPARIN SOD (PORK) LOCK FLUSH 100 UNIT/ML IV SOLN
500.0000 [IU] | Freq: Once | INTRAVENOUS | Status: AC
  Administered 2023-08-28: 500 [IU] via INTRAVENOUS
  Filled 2023-08-28: qty 5

## 2023-08-28 MED ORDER — SODIUM CHLORIDE 0.9% FLUSH
10.0000 mL | INTRAVENOUS | Status: DC | PRN
  Administered 2023-08-28: 10 mL via INTRAVENOUS
  Filled 2023-08-28: qty 10

## 2023-08-28 NOTE — Progress Notes (Signed)
 68- Patient's wife verbally reports patient passed out last night, was seen in the ED, and diagnosed with an UTI while there. MD, Dr. Wilhelmenia Harada, notified and aware.

## 2023-08-28 NOTE — Progress Notes (Signed)
 Per MD to hold treatment today. Treatment team made aware.   Rolf Fells [employer]

## 2023-08-28 NOTE — Assessment & Plan Note (Signed)
 CT images shows slight lung progression -Finished palliative radiation on 03/05/2023. March 2025 CT scan shows stable disease.  Labs are reviewed and discussed with patient. Hold off 5-FU and Bevavizumab due to UTI

## 2023-08-28 NOTE — Assessment & Plan Note (Signed)
 Recommend patient to finish planned course of antibiotics.

## 2023-08-28 NOTE — Progress Notes (Signed)
 Hematology/Oncology Progress note [phone]) [phone] [phone]    Chief Complaint: Jared Gutierrez is a 83 y.o.  male presents for follow-up of metastatic colon cancer   ASSESSMENT & PLAN:   Cancer Staging  Cancer of transverse colon [facility]) Staging form: Colon and Rectum, [name], [name], [name], MD on 10/26/2021   Cancer of transverse colon [facility]) CT images shows slight lung progression -Finished palliative radiation on 03/05/2023. March 2025 CT scan shows stable disease.  Labs are reviewed and discussed with patient. Hold off 5-FU and Bevavizumab due to UTI  UTI (urinary tract infection) Recommend patient to finish planned course of antibiotics.     Follow-up 3 weeks  All questions were answered. The patient knows to call the clinic with any problems, questions or concerns.  [name], MD, PhD [facility] Hematology Oncology 08/28/2023     PERTINENT ONCOLOGY HISTORY Jared Gutierrez is a 83 y.o.[place] who has above oncology history reviewed by me today presented for follow up visit for management of Metastatic colon cancer. [name], patient switched care to me on 11/11/20 Extensive medical record review was performed by me  Oncology History  History of colon cancer, stage I  10/02/2014 Pathology Results   [id number] colonoscopy pathology A. CECAL POLYPS; HOT SNARE AND COLD BIOPSY:  - TUBULAR ADENOMA, MULTIPLE FRAGMENTS.  - NEGATIVE FOR HIGH-GRADE DYSPLASIA AND MALIGNANCY.   B. COLON POLYP, TRANSVERSE; COLD BIOPSY:  - TUBULAR ADENOMA.  - NEGATIVE FOR HIGH-GRADE DYSPLASIA AND MALIGNANCY.     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  Tumor extended into the submucosa. Margins were negative. + lymphovascular invasion. 14 lymph nodes were negative. Pathologic stage was T1 N0.  TRANSVERSE COLON, RESECTION:  - INVASIVE ADENOCARCINOMA ARISING IN A 4.6 CM TUBULOVILLOUS  ADENOMA WITH HIGH GRADE DYSPLASIA (MALIGNANT POLYP).  - SEE SUMMARY BELOW.  [name]  ONCOLOGY SUMMARY: COLON AND RECTUM, RESECTION, AJCC 7TH EDITION  Specimen: transverse colon  Procedure: transverse colon resection  Tumor site: splenic flexure  Tumor size: 1.0 cm (invasive component)  Macroscopic tumor perforation: not specified  Histologic type: invasive adenocarcinoma  Histologic grade: moderately differentiated  Microscopic tumor extension: into submucosa  Margins:       Proximal margin: negative       Distal margin: negative       Circumferential (radial) or mesenteric margin: negative       If all margins uninvolved by invasive carcinoma:       Distance of invasive carcinoma from closest margin: 7.5 cm  to distal margin  Treatment effect: not applicable  Lymph-vascular invasion: present  Perineural invasion: not  identified  Tumor deposits (discontinuous extramural extension): not  identified  Pathologic staging:       Primary tumor: pT1       Regional lymph nodes: pN0            Number of nodes examined: 14            Number of nodes involved: 0    04/02/2019 Progression   04/02/2019  PET scan  limited  evealed a 2.4 x 2.3 cm (SUV 11) hypermetabolic soft tissue density caudal and anterior to the pancreatic neck favored to represent isolated peritoneal or nodal metastasis in the setting of prior transverse colonic resection (expected primary drainage). Although this was immediately adjacent to the pancreas, a fat plane was maintained, arguing strongly against a pancreatic primary. Otherwise, there was no evidence of hypermetabolic metastasis. 04/17/2019 EUS on  revealed a normal esophagus, stomach, duodenum, and pancreas.  There was a 2.4 x 2.4 cm irregular mass in the retroperitoneum adjacent to, but not involving the pancreatic neck.  FNA and core needle biopsy were performed.  Pathology revealed adenocarcinoma compatible with a metastatic lesion of colorectal origin.  Tumor cells were positive for CK20 and CDX2 and negative for CK7.  NGS: [employer] on 05/15/2019 revealed + [id number] and TP53.  Negative results included BRAF V600E, Her2, NRAS, NTRK, PD-L1 (<1%), and TMB 8.7/Mb (intermediate).  MMR testing from his colon resection on 10/16/2013 was intact with a low probability of MSI-H.   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  He received Neulasta after cycle #4 and #5 secondary to progressive leukopenia.  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  He received a truncated course of FOLFOX with cycle #11 secondary to oxaliplatin reaction.   01/14/2020 Imaging    PET revealed an interval decrease in size and FDG uptake (2.4 x 2.3 cm with SUV 11 to 2.4 x 1.7 cm with SUV 4.09) associated with the previously referenced soft tissue density caudal and anterior to the pancreatic neck suggesting treatment response. There were no new sites of FDG avid tumor.   08/31/2020 Imaging   08/31/2020 Abdomen and pelvis CT revealed increased size of the index soft  tissue lesion inferior and anterior to the pancreatic neck (2.9 x 1.8 cm to 3.5 x 2.9 cm). There were similar prominent retroperitoneal lymph nodes without adenopathy by size criteria. There was no new or enlarging abdominal or pelvic lymph nodes. There were no new interval findings. There was similar circumferential wall thickening of a nondistended urinary bladder, which likely accentuated wall thickening There was hepatic steatosis and aortic atherosclerosis.   11/03/2020, PET showed recurrent peritoneal metastasis in the upper abdomen adjacent to the pancreas.  The lesion is 3.8 x 2.9 cm with SUV of 11.3. No evidence of metastatic peritoneal disease elsewhere in the abdomen pelvis.  No evidence of distant metastasis.   11/03/2020 Imaging   PET scan showed recurrent peritoneal metastasis near pancreas. Given that he has no other distant metastasis. Discussed with radiation oncology. Repeat SBRT may be considered if he does not respond well to systemic chemotherapy.     11/26/2020 -  Chemotherapy   5-FU and bevacizumab.   02/17/2021 Imaging   02/17/2021 CT abdomen pelvis showed partial response. Mild decrease of peritoneal lesion.  Proceed with 5-FU and bevacizumab today.  Given that he is tolerating current  regimen with good life quality, partial response.  Shared decision was made to hold off adding additional chemotherapy agents.   06/08/2021, CT chest abdomen pelvis with contrast showed soft tissue mass at the base of mesentery inferior to the pancreatic neck is unchanged in size.  No progressive disease was identified.   09/07/2021, CT chest abdomen pelvis with contrast showed no substantial changes in size of the mesenteric mass, 2.6 cm.  No suspicious new lesions.  Chronic findings as detailed in the imaging report.   12/05/2021 Imaging   PET scan showed soft tissue mesenteric mass is unchanged in size.  Decreased FDG uptake compared to activity on November 03, 2020.  Fever treated disease.  No  new metastatic disease.   03/21/2022 Imaging   CT chest abdomen pelvis  1. Similar size of the peripancreatic presumed peritoneal implant compared to 09/07/2021. 2. No new or progressive disease. 3. Incidental findings, including: Coronary artery atherosclerosis.Aortic Atherosclerosis (ICD10-I70.0). Prostatomegaly with chronic bladder wall thickening, suggesting outlet obstruction.     07/11/2022 Imaging   CT chest abdomen pelvis with contrast showed 1. New 6 mm nodule in the right upper lobe and 2 mm nodule in the left upper lobe. Although small these are concerning. Recommend short follow-up in 3 months 2. Stable low-attenuation lesion along the midbody of the pancreas. Continued attention on follow-up. No new peritoneal mass lesions or ascites. 3. Fatty liver infiltration 4. Colonic surgical changes   10/03/2022 Imaging   CT chest abdomen pelvis w contrast showed 1. Slight interval enlargement and cavitation of a nodule in the right upper lobe, measuring 0.6 cm, previously 0.5 cm. Slight interval enlargement of a left upper lobe nodule measuring 0.3 cm,previously 0.2 cm. Findings are concerning for slowly enlarging small pulmonary metastases. 2. No significant change in a lobulated soft tissue mass within the central small bowel mesentery abutting the inferior aspect of the pancreas, consistent with a stable, treated metastasis. 3. No other evidence of lymphadenopathy or metastatic disease in the chest, abdomen, or pelvis. 4. Prostatomegaly with diffuse wall thickening of the relatively decompressed urinary bladder, likely secondary to chronic outlet obstruction. 5. Unchanged fibrotic scarring and bronchiectasis of the bilateral lung bases. This could be further assessed by pulmonary referral and dedicated interstitial lung disease protocol CT examination of the chest if and when clinically appropriate in the setting of known metastatic malignancy. Aortic Atherosclerosis    12/27/2022  Imaging   CT chest abdomen pelvis w contrast showed 1. Continued slight interval enlargement of a cavitary nodule in the right upper lobe measuring 0.7 x 0.6 cm, as well as a nodule in the peripheral left upper lobe measuring 0.5 cm. These are highly suspicious for slowly enlarging pulmonary metastases but remain below size threshold for reliable PET-CT characterization. 2. Unchanged, lobulated hypodense soft tissue mass in the central small bowel mesentery, consistent with treated metastatic disease. 3. Status post transverse colon resection and reanastomosis. 4. Thickening of the distal sigmoid colon and rectum, consistent with nonspecific infectious or inflammatory colitis. 5. Prostatomegaly with diffuse thickening of the urinary bladder wall, likely secondary to chronic outlet obstruction.   Aortic Atherosclerosis (ICD10-I70.0).   01/09/2023 Imaging   PET scan showed Status post transverse colectomy. 4.2 cm soft tissue mass in the central abdominal mesentery, suspicious for residual/recurrent nodal metastasis. Additional 8 mm short axis left para-aortic nodal metastasis. 7 mm cavitary right upper lobe nodule and 5 mm left upper lobe nodule, both beneath the size threshold for PET sensitivity, but suspicious for small  pulmonary metastases.   01/30/2023 -  Radiation Therapy   Palliative radiation to mesenteric soft tissue mass.    05/07/2023 Imaging   CT chest abdomen pelvis w contrast showed  Focal nodular area along the inferior margin of the midbody of the pancreas similar to previous. There is also some abnormal soft tissue extending into the porta hepatis encasing the hepatic artery which compared to previous is also slightly increased.   New small soft tissue nodule adjacent to the SMA proximally.   Previous hypermetabolic node left para-aortic is similar in size to previous.   Stable bilateral small lung nodules. Right lesions again cavitary. No new lung nodules.   Stable  surgical changes.  Chest port.     Imaging     08/06/2023 Imaging   CT chest abdomen pelvis w contrast showed Small nodules are again seen. These appears slightly smaller overall from previous. No new lung nodule identified at this time.   Lesion along the inferior margin of the midbody of the pancreas similar to the prior examination. Persistent ill-defined soft tissue extending along the porta hepatis encasing the course of the [facility] artery. Small lymph nodes elsewhere in the upper retroperitoneum are stable.      Other medical problems Chronic lower extremity edema. 12/24/2018, right lower extremity duplex negative for DVT.  Small right Baker's cyst. 08/16/2019, bilateral lower extremity duplex showed no DVT. 08/16/2019 - 08/17/2019 with right lower extremity cellulitis.  He was unable to bear weight.  He was treated with IV fluids, NSAIDs, colchicine, and broad antibiotics (vancomycin and Cefepime).  He was discharged on indomethacin x 5 days and Keflex 500 mg TID x 5 days.  10/05/2020, colonoscopy showed internal hemorrhoids.  Otherwise normal examination.  INTERVAL HISTORY Jared Gutierrez is a 83 y.o. male who has above history reviewed by me today presents for follow up visit for management of recurrent metastatic colon cancer Patient was in ER yesterday for evaluaiton of dizzy spell/weakness. Was found to have UTI and received IV antibiotics. He was prescribed 10 day course of Cefdinir.  Today he reports feeling better. No fever chills, flank pain. No urinary symptoms.   Review of Systems  Constitutional:  Negative for appetite change, chills, diaphoresis, fever and unexpected weight change.  HENT:   Negative for hearing loss, nosebleeds, sore throat and tinnitus.   Respiratory:  Negative for cough, hemoptysis and shortness of breath.   Cardiovascular:  Negative for chest pain and palpitations.  Gastrointestinal:  Negative for abdominal pain, blood in stool, constipation,  diarrhea, nausea and vomiting.  Genitourinary:  Negative for dysuria, frequency and hematuria.   Musculoskeletal:  Positive for arthralgias. Negative for back pain, myalgias and neck pain.  Skin:  Negative for itching and rash.  Neurological:  Positive for dizziness and numbness. Negative for headaches.  Hematological:  Does not bruise/bleed easily.  Psychiatric/Behavioral:  Negative for depression. The patient is not nervous/anxious.       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   Chicken pox    Colon cancer (HCC)    History of kidney stones    Hypertension    Lipoma of colon    Nephrolithiasis    Nephrolithiasis    Obesity    Shingles    Tubular adenoma of colon    multiple fragments    Past Surgical History:  Procedure Laterality Date  COLON SURGERY     COLONOSCOPY N/A 10/02/2014   Procedure: COLONOSCOPY;  Surgeon: Elnita Maxwell, MD;  Location: [facility] ENDOSCOPY;  Service: Endoscopy;  Laterality: N/A;   COLONOSCOPY WITH PROPOFOL N/A 02/16/2017   Procedure: COLONOSCOPY WITH PROPOFOL;  Surgeon: Scot Jun, MD;  Location[facility] ENDOSCOPY;  Service: Endoscopy;  Laterality: N/A;   COLONOSCOPY WITH PROPOFOL N/A 10/05/2020   Procedure: COLONOSCOPY WITH PROPOFOL;  Surgeon: Regis Bill, MD;  Location: [facility] ENDOSCOPY;  Service: Endoscopy;  Laterality: N/A;   EUS N/A 04/17/2019   Procedure: FULL UPPER ENDOSCOPIC ULTRASOUND (EUS) RADIAL;  Surgeon: Bearl Mulberry, MD;  Location: [facility] ENDOSCOPY;  Service: Gastroenterology;  Laterality: N/A;   KIDNEY STONE SURGERY     PARTIAL COLECTOMY  10/17/2013   PORTACATH PLACEMENT Right 06/13/2019   Procedure: INSERTION PORT-A-CATH;  Surgeon: Sung Amabile, DO;  Location: [facility] ORS;  Service: General;  Laterality: Right;    Family History  Problem Relation Age of Onset   Cancer Mother    Breast cancer Mother    COPD Father      Social History:  reports that he has never smoked. He has never used smokeless tobacco. He reports current alcohol use. He reports that he does not use drugs.    Allergies: No Known Allergies  Current Medications: Current Outpatient Medications  Medication Sig Dispense Refill   acetaminophen (TYLENOL) 500 MG tablet Take 500 mg by mouth every 6 (six) hours as needed.     amLODipine (NORVASC) 2.5 MG tablet Take 2.5 mg by mouth daily.     aspirin EC 81 MG tablet Take 81 mg by mouth daily.     cefdinir (OMNICEF) 300 MG capsule Take 1 capsule (300 mg total) by mouth 2 (two) times daily for 10 days. 20 capsule 0   Cholecalciferol (VITAMIN D) 125 MCG (5000 UT) CAPS Take 5,000 mg by mouth.     cyanocobalamin (VITAMIN B12) 500 MCG tablet Take by mouth.     docusate sodium (COLACE) 100 MG capsule Take 1 capsule (100 mg total) by mouth 2 (two) times daily as needed for mild constipation or moderate constipation. 60 capsule 3   Ferrous Fumarate (HEMOCYTE - 106 MG FE) 324 (106 Fe) MG TABS tablet Take 1 tablet by mouth.     lidocaine-prilocaine (EMLA) cream Apply small amount to port and cover with saran wrap 1-2 hours prior to port access 30 g 3   loperamide (IMODIUM) 2 MG capsule Take 1 capsule (2 mg total) by mouth See admin instructions. Take 2 tablets after first loose stool,  then 1 tablet  after each loose stool; maximum: 8 tablets /day 60 capsule 0   loratadine (CLARITIN) 10 MG tablet Take 10 mg by mouth daily.     potassium chloride SA (KLOR-CON M20) 20 MEQ tablet Take 1 tablet (20 mEq total) by mouth daily. 180 tablet 0   Probiotic Product (PROBIOTIC DAILY PO) Take 1 tablet by mouth daily.     prochlorperazine (COMPAZINE) 10 MG tablet Take 1 tablet (10 mg total) by mouth every 6 (six) hours as needed (NAUSEA). 30 tablet 1   senna-docusate (SENNA S) 8.6-50 MG tablet Take 2 tablets by mouth daily. 60 tablet 3   tamsulosin (FLOMAX) 0.4 MG CAPS capsule Take 1 capsule (0.4 mg total) by mouth daily  after supper. 30 capsule 0   Calcium Carbonate (CALCIUM 600 PO) Take 1 tablet by mouth 2 (two) times daily. (Patient not taking: Reported on 07/30/2023)     furosemide (  LASIX) 20 MG tablet Take 20 mg by mouth as needed. Prn for swelling (Patient not taking: Reported on 08/28/2023)     HYDROcodone-acetaminophen (NORCO/VICODIN) 5-325 MG tablet Take 1 tablet by mouth every 6 (six) hours as needed for moderate pain (up to 3 doses for moderate pain.). (Patient not taking: Reported on 07/30/2023)     megestrol (MEGACE) 40 MG tablet TAKE 2 TABLETS BY MOUTH 2 TIMES DAILY. (Patient not taking: Reported on 07/30/2023) 360 tablet 1   olmesartan (BENICAR) 20 MG tablet Take 1 tablet by mouth daily. (Patient not taking: Reported on 08/28/2023)     ondansetron (ZOFRAN) 8 MG tablet Take 1 tablet (8 mg total) by mouth 2 (two) times daily as needed for refractory nausea / vomiting. Start on day 3 after chemotherapy. (Patient not taking: Reported on 07/30/2023) 60 tablet 1   No current facility-administered medications for this visit.   Facility-Administered Medications Ordered in Other Visits  Medication Dose Route Frequency Provider Last Rate Last Admin   0.9 %  sodium chloride infusion   Intravenous Once Corcoran, Melissa C, MD       0.9 %  sodium chloride infusion   Intravenous Continuous Beverely Buba, Melissa C, MD 10 mL/hr at 12/31/19 1000 New Bag at 10/05/20 1045   heparin lock flush 100 unit/mL  500 Units Intravenous Once Corcoran, Melissa C, MD       sodium chloride flush (NS) 0.9 % injection 10 mL  10 mL Intracatheter PRN Timmy Forbes, MD   10 mL at 02/01/23 1341   sodium chloride flush (NS) 0.9 % injection 10 mL  10 mL Intravenous PRN Timmy Forbes, MD   10 mL at 08/28/23 0821    Performance status (ECOG): 1  Vitals Blood pressure (!) 109/52, pulse 75, temperature 97.8 F (36.6 C), resp. rate 20, weight 201 lb 11.2 oz (91.5 kg), SpO2 100%.   Physical Exam Constitutional:      General: He is not in acute distress.     Appearance: He is obese. He is not diaphoretic.  HENT:     Head: Normocephalic and atraumatic.  Eyes:     General: No scleral icterus. Cardiovascular:     Rate and Rhythm: Normal rate and regular rhythm.     Heart sounds: No murmur heard. Pulmonary:     Effort: Pulmonary effort is normal. No respiratory distress.     Breath sounds: No wheezing.     Comments: Decreased breath sound bilaterally.  Abdominal:     General: Bowel sounds are normal. There is no distension.     Palpations: Abdomen is soft.  Musculoskeletal:        General: Normal range of motion.     Cervical back: Normal range of motion and neck supple.     Comments: Trace edema bilaterally  Skin:    General: Skin is warm and dry.     Findings: No erythema.  Neurological:     Mental Status: He is alert and oriented to person, place, and time. Mental status is at baseline.     Motor: No abnormal muscle tone.  Psychiatric:        Mood and Affect: Mood and affect normal.     Labs were reviewed by me.     Latest Ref Rng & Units 08/28/2023    8:21 AM 08/27/2023    2:28 PM 08/14/2023    8:06 AM  CBC  WBC 4.0 - 10.5 K/uL 4.0  3.1  3.6   Hemoglobin 13.0 -  17.0 g/dL 16.1  09.6  04.5   Hematocrit 39.0 - 52.0 % 32.2  34.6  34.1   Platelets 150 - 400 K/uL 174  161  206       Latest Ref Rng & Units 08/28/2023    8:21 AM 08/27/2023    2:28 PM 08/14/2023    8:06 AM  CMP  Glucose 70 - 99 mg/dL 409  811  89   BUN 8 - 23 mg/dL 13  12  12    Creatinine 0.61 - 1.24 mg/dL 9.14  7.82  9.56   Sodium 135 - 145 mmol/L 137  136  129   Potassium 3.5 - 5.1 mmol/L 4.0  4.3  3.9   Chloride 98 - 111 mmol/L 105  101  99   CO2 22 - 32 mmol/L 24  25  24    Calcium 8.9 - 10.3 mg/dL 9.0  9.0  9.0   Total Protein 6.5 - 8.1 g/dL 5.8  5.9  5.9   Total Bilirubin 0.0 - 1.2 mg/dL 0.8  1.3  0.9   Alkaline Phos 38 - 126 U/L 49  44  51   AST 15 - 41 U/L 28  37  25   ALT 0 - 44 U/L 15  13  14

## 2023-08-29 ENCOUNTER — Other Ambulatory Visit: Payer: Self-pay

## 2023-08-30 ENCOUNTER — Inpatient Hospital Stay

## 2023-09-04 ENCOUNTER — Other Ambulatory Visit: Payer: Self-pay | Admitting: Radiation Oncology

## 2023-09-04 ENCOUNTER — Other Ambulatory Visit: Payer: Self-pay | Admitting: *Deleted

## 2023-09-04 MED ORDER — TAMSULOSIN HCL 0.4 MG PO CAPS: 0.4000 mg | ORAL_CAPSULE | Freq: Every day | ORAL | 3 refills | Status: DC

## 2023-09-05 DIAGNOSIS — R55 Syncope and collapse: Secondary | ICD-10-CM

## 2023-09-11 ENCOUNTER — Encounter

## 2023-09-11 ENCOUNTER — Other Ambulatory Visit

## 2023-09-11 ENCOUNTER — Ambulatory Visit: Admitting: Oncology

## 2023-09-11 ENCOUNTER — Ambulatory Visit

## 2023-09-13 ENCOUNTER — Encounter

## 2023-09-18 ENCOUNTER — Encounter: Payer: Self-pay | Admitting: Oncology

## 2023-09-18 ENCOUNTER — Inpatient Hospital Stay

## 2023-09-18 ENCOUNTER — Inpatient Hospital Stay (HOSPITAL_BASED_OUTPATIENT_CLINIC_OR_DEPARTMENT_OTHER): Admitting: Oncology

## 2023-09-18 ENCOUNTER — Inpatient Hospital Stay: Attending: Oncology

## 2023-09-18 VITALS — BP 118/78 | HR 66 | Temp 98.4°F | Resp 18 | Wt 200.7 lb

## 2023-09-18 DIAGNOSIS — Z85038 Personal history of other malignant neoplasm of large intestine: Secondary | ICD-10-CM | POA: Insufficient documentation

## 2023-09-18 DIAGNOSIS — Z5111 Encounter for antineoplastic chemotherapy: Secondary | ICD-10-CM | POA: Diagnosis present

## 2023-09-18 DIAGNOSIS — C786 Secondary malignant neoplasm of retroperitoneum and peritoneum: Secondary | ICD-10-CM

## 2023-09-18 DIAGNOSIS — Z860100 Personal history of colon polyps, unspecified: Secondary | ICD-10-CM | POA: Diagnosis not present

## 2023-09-18 DIAGNOSIS — R918 Other nonspecific abnormal finding of lung field: Secondary | ICD-10-CM | POA: Diagnosis not present

## 2023-09-18 DIAGNOSIS — Z7982 Long term (current) use of aspirin: Secondary | ICD-10-CM | POA: Diagnosis not present

## 2023-09-18 DIAGNOSIS — E871 Hypo-osmolality and hyponatremia: Secondary | ICD-10-CM | POA: Insufficient documentation

## 2023-09-18 DIAGNOSIS — T451X5D Adverse effect of antineoplastic and immunosuppressive drugs, subsequent encounter: Secondary | ICD-10-CM | POA: Diagnosis not present

## 2023-09-18 DIAGNOSIS — D6481 Anemia due to antineoplastic chemotherapy: Secondary | ICD-10-CM | POA: Insufficient documentation

## 2023-09-18 DIAGNOSIS — Z803 Family history of malignant neoplasm of breast: Secondary | ICD-10-CM | POA: Diagnosis not present

## 2023-09-18 DIAGNOSIS — Z79899 Other long term (current) drug therapy: Secondary | ICD-10-CM | POA: Diagnosis not present

## 2023-09-18 DIAGNOSIS — K219 Gastro-esophageal reflux disease without esophagitis: Secondary | ICD-10-CM | POA: Insufficient documentation

## 2023-09-18 DIAGNOSIS — C184 Malignant neoplasm of transverse colon: Secondary | ICD-10-CM

## 2023-09-18 DIAGNOSIS — Z836 Family history of other diseases of the respiratory system: Secondary | ICD-10-CM | POA: Insufficient documentation

## 2023-09-18 DIAGNOSIS — Z452 Encounter for adjustment and management of vascular access device: Secondary | ICD-10-CM | POA: Insufficient documentation

## 2023-09-18 DIAGNOSIS — T451X5A Adverse effect of antineoplastic and immunosuppressive drugs, initial encounter: Secondary | ICD-10-CM

## 2023-09-18 DIAGNOSIS — Z809 Family history of malignant neoplasm, unspecified: Secondary | ICD-10-CM | POA: Diagnosis not present

## 2023-09-18 DIAGNOSIS — Z5112 Encounter for antineoplastic immunotherapy: Secondary | ICD-10-CM | POA: Insufficient documentation

## 2023-09-18 LAB — COMPREHENSIVE METABOLIC PANEL WITH GFR
ALT: 17 U/L (ref 0–44)
AST: 31 U/L (ref 15–41)
Albumin: 3.2 g/dL — ABNORMAL LOW (ref 3.5–5.0)
Alkaline Phosphatase: 50 U/L (ref 38–126)
Anion gap: 6 (ref 5–15)
BUN: 11 mg/dL (ref 8–23)
CO2: 25 mmol/L (ref 22–32)
Calcium: 8.6 mg/dL — ABNORMAL LOW (ref 8.9–10.3)
Chloride: 100 mmol/L (ref 98–111)
Creatinine, Ser: 0.55 mg/dL — ABNORMAL LOW (ref 0.61–1.24)
GFR, Estimated: >60 mL/min (ref >60)
Glucose, Bld: 123 mg/dL — ABNORMAL HIGH (ref 70–99)
Potassium: 3.9 mmol/L (ref 3.5–5.1)
Sodium: 131 mmol/L — ABNORMAL LOW (ref 135–145)
Total Bilirubin: 0.8 mg/dL (ref 0.0–1.2)
Total Protein: 5.7 g/dL — ABNORMAL LOW (ref 6.5–8.1)

## 2023-09-18 LAB — CBC WITH DIFFERENTIAL/PLATELET
Abs Immature Granulocytes: 0.02 10*3/uL (ref 0.00–0.07)
Basophils Absolute: 0 10*3/uL (ref 0.0–0.1)
Basophils Relative: 1 %
Eosinophils Absolute: 0.2 10*3/uL (ref 0.0–0.5)
Eosinophils Relative: 4 %
HCT: 32.7 % — ABNORMAL LOW (ref 39.0–52.0)
Hemoglobin: 11.2 g/dL — ABNORMAL LOW (ref 13.0–17.0)
Immature Granulocytes: 1 %
Lymphocytes Relative: 19 %
Lymphs Abs: 0.8 10*3/uL (ref 0.7–4.0)
MCH: 31.6 pg (ref 26.0–34.0)
MCHC: 34.3 g/dL (ref 30.0–36.0)
MCV: 92.4 fL (ref 80.0–100.0)
Monocytes Absolute: 0.4 10*3/uL (ref 0.1–1.0)
Monocytes Relative: 9 %
Neutro Abs: 2.8 10*3/uL (ref 1.7–7.7)
Neutrophils Relative %: 66 %
Platelets: 184 10*3/uL (ref 150–400)
RBC: 3.54 MIL/uL — ABNORMAL LOW (ref 4.22–5.81)
RDW: 12.8 % (ref 11.5–15.5)
WBC: 4.2 10*3/uL (ref 4.0–10.5)
nRBC: 0 % (ref 0.0–0.2)

## 2023-09-18 LAB — PROTEIN, URINE, RANDOM: Total Protein, Urine: 50 mg/dL

## 2023-09-18 MED ORDER — SODIUM CHLORIDE 0.9% FLUSH
10.0000 mL | INTRAVENOUS | Status: DC | PRN
  Filled 2023-09-18: qty 10

## 2023-09-18 MED ORDER — SODIUM CHLORIDE 0.9 % IV SOLN
Freq: Once | INTRAVENOUS | Status: AC
  Filled 2023-09-18: qty 250

## 2023-09-18 MED ORDER — OMEPRAZOLE 20 MG PO CPDR: 20.0000 mg | DELAYED_RELEASE_CAPSULE | Freq: Every day | ORAL | 3 refills | Status: DC

## 2023-09-18 MED ORDER — SODIUM CHLORIDE 0.9 % IV SOLN
2400.0000 mg/m2 | INTRAVENOUS | Status: DC
  Administered 2023-09-18: 5000 mg via INTRAVENOUS
  Filled 2023-09-18: qty 100

## 2023-09-18 MED ORDER — SODIUM CHLORIDE 0.9 % IV SOLN
400.0000 mg/m2 | Freq: Once | INTRAVENOUS | Status: AC
  Administered 2023-09-18: 860 mg via INTRAVENOUS
  Filled 2023-09-18: qty 43

## 2023-09-18 MED ORDER — BEVACIZUMAB-ADCD CHEMO INJECTION 400 MG/16ML
5.0000 mg/kg | Freq: Once | INTRAVENOUS | Status: AC
  Administered 2023-09-18: 500 mg via INTRAVENOUS
  Filled 2023-09-18: qty 16

## 2023-09-18 MED ORDER — HEPARIN SOD (PORK) LOCK FLUSH 100 UNIT/ML IV SOLN
500.0000 [IU] | Freq: Once | INTRAVENOUS | Status: DC | PRN
Start: 2023-09-18 — End: 2023-09-18
  Filled 2023-09-18: qty 5

## 2023-09-18 NOTE — Assessment & Plan Note (Signed)
 Recommend patient to increase oral hydration.

## 2023-09-18 NOTE — Assessment & Plan Note (Signed)
 Stable continue to monitor

## 2023-09-18 NOTE — Patient Instructions (Signed)

## 2023-09-18 NOTE — Assessment & Plan Note (Signed)
 Stable  small lung nodule

## 2023-09-18 NOTE — Assessment & Plan Note (Addendum)
 CT images shows slight lung progression -Finished palliative radiation on 03/05/2023. March 2025 CT scan shows stable disease.  Labs are reviewed and discussed with patient. Proceed with 5-FU and Bevavizumab

## 2023-09-18 NOTE — Patient Instructions (Signed)
 [facility] - A DEPT OF [place]  Discharge Instructions: Thank you for choosing [facility] to provide your oncology and hematology care.  If you have a lab appointment with the Cancer Center, please go directly to the Cancer Center and check in at the registration area.  Wear comfortable clothing and clothing appropriate for easy access to any Portacath or PICC line.   We strive to give you quality time with your provider. You may need to reschedule your appointment if you arrive late (15 or more minutes).  Arriving late affects you and other patients whose appointments are after yours.  Also, if you miss three or more appointments without notifying the office, you may be dismissed from the clinic at the provider's discretion.      For prescription refill requests, have your pharmacy contact our office and allow 72 hours for refills to be completed.    Today you received the following chemotherapy and/or immunotherapy agents Vegzelma , Leucovorin  & Adrucil       To help prevent nausea and vomiting after your treatment, we encourage you to take your nausea medication as directed.  BELOW ARE SYMPTOMS THAT SHOULD BE REPORTED IMMEDIATELY: *FEVER GREATER THAN 100.4 F (38 C) OR HIGHER *CHILLS OR SWEATING *NAUSEA AND VOMITING THAT IS NOT CONTROLLED WITH YOUR NAUSEA MEDICATION *UNUSUAL SHORTNESS OF BREATH *UNUSUAL BRUISING OR BLEEDING *URINARY PROBLEMS (pain or burning when urinating, or frequent urination) *BOWEL PROBLEMS (unusual diarrhea, constipation, pain near the anus) TENDERNESS IN MOUTH AND THROAT WITH OR WITHOUT PRESENCE OF ULCERS (sore throat, sores in mouth, or a toothache) UNUSUAL RASH, SWELLING OR PAIN  UNUSUAL VAGINAL DISCHARGE OR ITCHING   Items with * indicate a potential emergency and should be followed up as soon as possible or go to the Emergency Department if any problems should occur.  Please show the CHEMOTHERAPY ALERT  CARD or IMMUNOTHERAPY ALERT CARD at check-in to the Emergency Department and triage nurse.  Should you have questions after your visit or need to cancel or reschedule your appointment, please contact [facility] - A DEPT OF [facility]  [phone] and follow the prompts.  Office hours are 8:00 a.m. to 4:30 p.m. Monday - Friday. Please note that voicemails left after 4:00 p.m. may not be returned until the following business day.  We are closed weekends and major holidays. You have access to a nurse at all times for urgent questions. Please call the main number to the clinic [phone] and follow the prompts.  For any non-urgent questions, you may also contact your provider using MyChart. We now offer e-Visits for anyone 31 and older to request care online for non-urgent symptoms. For details visit [email or web address].   Also download the MyChart app! Go to the app store, search "MyChart", open the app, select [facility], and log in with your MyChart username and password.

## 2023-09-18 NOTE — Assessment & Plan Note (Signed)
Trial of omeprazole 20 mg daily

## 2023-09-18 NOTE — Progress Notes (Signed)
 Nutrition Follow-up:  Patient with metastatic colon cancer.  S/p transverse colectomy (2015).  Patient receiving fluorouracil  and bevacizumab .     Met with patient during infusion.  Reports that his appetite is pretty good.  Says that sometimes foods upset his stomach, make him feel queasy.  Does not sound like he is taking nausea medication.  Drinking ensure daily.  Usually has egg, toast, coffee, fruit for breakfast.  Lunch yesterday was vegetable soup and dinner last night was lettuce and mayo sandwich.     Medications: reviewed  Labs: reviewed  Anthropometrics:   Weight 200 lb 11.2 oz  205 lb 4.8 oz on 4/1 213 lb 8 oz on 1/7 206 lb 9.6 oz on 11/12 209 lb 14.4 oz on 10/15 215 lb on 10/1 238 lb on 3/5 243 lb on 10/31   NUTRITION DIAGNOSIS: Unintentional weight loss continues   INTERVENTION:  Encouraged taking nausea medication Recommend increasing ensure shake to BID for added calories Discussed adding protein to lettuce and mayo sandwich    MONITORING, EVALUATION, GOAL: weight trends, intake   NEXT VISIT: as needed  Ladaysha Soutar B. Zollie Hipp, CSO, LDN Registered Dietitian [phone]

## 2023-09-18 NOTE — Assessment & Plan Note (Signed)
 Chemotherapy plan as listed above

## 2023-09-18 NOTE — Progress Notes (Signed)
 Hematology/Oncology Progress note [phone] [phone]    Chief Complaint: Jared Gutierrez is a 83 y.o.  male presents for follow-up of metastatic colon cancer   ASSESSMENT & PLAN:   Cancer Staging  Cancer of transverse colon [facility]) Staging form: Colon and Rectum, [name], [name], cM1) - Signed by Jared Forbes, MD on 10/26/2021   Cancer of transverse colon [facility]) CT images shows slight lung progression -Finished palliative radiation on 03/05/2023. March 2025 CT scan shows stable disease.  Labs are reviewed and discussed with patient. Proceed with 5-FU and Bevavizumab   Anemia due to chemotherapy Stable continue to monitor.  Encounter for antineoplastic chemotherapy Chemotherapy plan as listed above.   Hyponatremia Recommend patient to increase oral hydration.    Lung nodules Stable  small lung nodule  GERD (gastroesophageal reflux disease) Trial of omeprazole 20mg  daily    Follow-up 3 weeks  All questions were answered. The patient knows to call the clinic with any problems, questions or concerns.  Jared Forbes, MD, PhD [facility] Hematology Oncology 09/18/2023     PERTINENT ONCOLOGY HISTORY Jared Gutierrez is a 83 y.o.[place] who has above oncology history reviewed by me today presented for follow up visit for management of Metastatic colon cancer. [name], patient switched care to me on 11/11/20 Extensive medical record review was performed by me  Oncology History  History of colon cancer, stage I  10/02/2014 Pathology Results   [id number] colonoscopy pathology A. CECAL POLYPS; HOT SNARE AND COLD BIOPSY:  - TUBULAR ADENOMA, MULTIPLE FRAGMENTS.  - NEGATIVE FOR HIGH-GRADE DYSPLASIA AND MALIGNANCY.   B. COLON POLYP, TRANSVERSE; COLD BIOPSY:  - TUBULAR ADENOMA.  - NEGATIVE FOR HIGH-GRADE DYSPLASIA AND MALIGNANCY.     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  Tumor extended into the submucosa. Margins were negative. + lymphovascular invasion. 14 lymph nodes were negative. Pathologic stage was T1 N0.  TRANSVERSE COLON, RESECTION:  - INVASIVE ADENOCARCINOMA ARISING IN A 4.6 CM TUBULOVILLOUS  ADENOMA WITH HIGH GRADE DYSPLASIA (MALIGNANT POLYP).  - SEE SUMMARY BELOW.  Jared Gutierrez  ONCOLOGY SUMMARY: COLON AND RECTUM, RESECTION, AJCC 7TH EDITION  Specimen: transverse colon  Procedure: transverse colon resection  Tumor site: splenic flexure  Tumor size: 1.0 cm (invasive component)  Macroscopic tumor perforation: not specified  Histologic type: invasive adenocarcinoma  Histologic grade: moderately differentiated  Microscopic tumor extension: into submucosa  Margins:       Proximal margin: negative       Distal margin: negative       Circumferential (radial) or mesenteric margin: negative       If all margins uninvolved by invasive carcinoma:  Distance of invasive carcinoma from closest margin: 7.5 cm  to distal margin  Treatment effect: not applicable  Lymph-vascular invasion: present  Perineural invasion: not identified  Tumor deposits (discontinuous extramural extension): not  identified   Pathologic staging:       Primary tumor: pT1       Regional lymph nodes: pN0            Number of nodes examined: 14            Number of nodes involved: 0    04/02/2019 Progression   04/02/2019  PET scan  limited evealed a 2.4 x 2.3 cm (SUV 11) hypermetabolic soft tissue density caudal and anterior to the pancreatic neck favored to represent isolated peritoneal or nodal metastasis in the setting of prior transverse colonic resection (expected primary drainage). Although this was immediately adjacent to the pancreas, a fat plane was maintained, arguing strongly against a pancreatic primary. Otherwise, there was no evidence of hypermetabolic metastasis. 04/17/2019 EUS on  revealed a normal esophagus, stomach, duodenum, and pancreas.  There was a 2.4 x 2.4 cm irregular mass in the retroperitoneum adjacent to, but not involving the pancreatic neck.  FNA and core needle biopsy were performed.  Pathology revealed adenocarcinoma compatible with a metastatic lesion of colorectal origin.  Tumor cells were positive for CK20 and CDX2 and negative for CK7.  NGS: [employer] on 05/15/2019 revealed + [id number] and TP53.  Negative results included BRAF V600E, Her2, NRAS, NTRK, PD-L1 (<1%), and TMB 8.7/Mb (intermediate).  MMR testing from his colon resection on 10/16/2013 was intact with a low probability of MSI-H.   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  He received Neulasta  after cycle #4 and #5 secondary to progressive leukopenia.  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  He received a truncated course of FOLFOX with cycle #11 secondary to oxaliplatin  reaction.   01/14/2020 Imaging    PET revealed an interval decrease in size and FDG uptake (2.4 x 2.3 cm with SUV 11 to 2.4 x 1.7 cm with SUV 4.09) associated with the previously referenced soft tissue  density caudal and anterior to the pancreatic neck suggesting treatment response. There were no new sites of FDG avid tumor.   08/31/2020 Imaging   08/31/2020 Abdomen and pelvis CT revealed increased size of the index soft tissue lesion inferior and anterior to the pancreatic neck (2.9 x 1.8 cm to 3.5 x 2.9 cm). There were similar prominent retroperitoneal lymph nodes without adenopathy by size criteria. There was no new or enlarging abdominal or pelvic lymph nodes. There were no new interval findings. There was similar circumferential wall thickening of a nondistended urinary bladder, which likely accentuated wall thickening There was hepatic steatosis and aortic atherosclerosis.   11/03/2020, PET showed recurrent peritoneal metastasis in the upper abdomen adjacent to the pancreas.  The lesion is 3.8 x 2.9 cm with SUV of 11.3. No evidence of metastatic peritoneal disease elsewhere in the abdomen pelvis.  No evidence of distant metastasis.   11/03/2020 Imaging   PET scan showed recurrent peritoneal metastasis near pancreas. Given that he has no other distant metastasis. Discussed with radiation oncology. Repeat SBRT may be considered if he does not respond well to systemic chemotherapy.     11/26/2020 -  Chemotherapy   5-FU and bevacizumab .   02/17/2021 Imaging  02/17/2021 CT abdomen pelvis showed partial response. Mild decrease of peritoneal lesion.  Proceed with 5-FU and bevacizumab  today.  Given that he is tolerating current regimen with good life quality, partial response.  Shared decision was made to hold off adding additional chemotherapy agents.   06/08/2021, CT chest abdomen pelvis with contrast showed soft tissue mass at the base of mesentery inferior to the pancreatic neck is unchanged in size.  No progressive disease was identified.   09/07/2021, CT chest abdomen pelvis with contrast showed no substantial changes in size of the mesenteric mass, 2.6 cm.  No suspicious new lesions.  Chronic  findings as detailed in the imaging report.   12/05/2021 Imaging   PET scan showed soft tissue mesenteric mass is unchanged in size.  Decreased FDG uptake compared to activity on November 03, 2020.  Fever treated disease.  No new metastatic disease.   03/21/2022 Imaging   CT chest abdomen pelvis  1. Similar size of the peripancreatic presumed peritoneal implant compared to 09/07/2021. 2. No new or progressive disease. 3. Incidental findings, including: Coronary artery atherosclerosis.Aortic Atherosclerosis (ICD10-I70.0). Prostatomegaly with chronic bladder wall thickening, suggesting outlet obstruction.     07/11/2022 Imaging   CT chest abdomen pelvis with contrast showed 1. New 6 mm nodule in the right upper lobe and 2 mm nodule in the left upper lobe. Although small these are concerning. Recommend short follow-up in 3 months 2. Stable low-attenuation lesion along the midbody of the pancreas. Continued attention on follow-up. No new peritoneal mass lesions or ascites. 3. Fatty liver infiltration 4. Colonic surgical changes   10/03/2022 Imaging   CT chest abdomen pelvis w contrast showed 1. Slight interval enlargement and cavitation of a nodule in the right upper lobe, measuring 0.6 cm, previously 0.5 cm. Slight interval enlargement of a left upper lobe nodule measuring 0.3 cm,previously 0.2 cm. Findings are concerning for slowly enlarging small pulmonary metastases. 2. No significant change in a lobulated soft tissue mass within the central small bowel mesentery abutting the inferior aspect of the pancreas, consistent with a stable, treated metastasis. 3. No other evidence of lymphadenopathy or metastatic disease in the chest, abdomen, or pelvis. 4. Prostatomegaly with diffuse wall thickening of the relatively decompressed urinary bladder, likely secondary to chronic outlet obstruction. 5. Unchanged fibrotic scarring and bronchiectasis of the bilateral lung bases. This could be further assessed by  pulmonary referral and dedicated interstitial lung disease protocol CT examination of the chest if and when clinically appropriate in the setting of known metastatic malignancy. Aortic Atherosclerosis    12/27/2022 Imaging   CT chest abdomen pelvis w contrast showed 1. Continued slight interval enlargement of a cavitary nodule in the right upper lobe measuring 0.7 x 0.6 cm, as well as a nodule in the peripheral left upper lobe measuring 0.5 cm. These are highly suspicious for slowly enlarging pulmonary metastases but remain below size threshold for reliable PET-CT characterization. 2. Unchanged, lobulated hypodense soft tissue mass in the central small bowel mesentery, consistent with treated metastatic disease. 3. Status post transverse colon resection and reanastomosis. 4. Thickening of the distal sigmoid colon and rectum, consistent with nonspecific infectious or inflammatory colitis. 5. Prostatomegaly with diffuse thickening of the urinary bladder wall, likely secondary to chronic outlet obstruction.   Aortic Atherosclerosis (ICD10-I70.0).   01/09/2023 Imaging   PET scan showed Status post transverse colectomy. 4.2 cm soft tissue mass in the central abdominal mesentery, suspicious for residual/recurrent nodal metastasis. Additional 8 mm short axis left para-aortic nodal metastasis.  7 mm cavitary right upper lobe nodule and 5 mm left upper lobe nodule, both beneath the size threshold for PET sensitivity, but suspicious for small pulmonary metastases.   01/30/2023 -  Radiation Therapy   Palliative radiation to mesenteric soft tissue mass.    05/07/2023 Imaging   CT chest abdomen pelvis w contrast showed  Focal nodular area along the inferior margin of the midbody of the pancreas similar to previous. There is also some abnormal soft tissue extending into the porta hepatis encasing the hepatic artery which compared to previous is also slightly increased.   New small soft tissue nodule  adjacent to the SMA proximally.   Previous hypermetabolic node left para-aortic is similar in size to previous.   Stable bilateral small lung nodules. Right lesions again cavitary. No new lung nodules.   Stable surgical changes.  Chest port.     Imaging     08/06/2023 Imaging   CT chest abdomen pelvis w contrast showed Small nodules are again seen. These appears slightly smaller overall from previous. No new lung nodule identified at this time.   Lesion along the inferior margin of the midbody of the pancreas similar to the prior examination. Persistent ill-defined soft tissue extending along the porta hepatis encasing the course of the [facility] artery. Small lymph nodes elsewhere in the upper retroperitoneum are stable.      Other medical problems Chronic lower extremity edema. 12/24/2018, right lower extremity duplex negative for DVT.  Small right Baker's cyst. 08/16/2019, bilateral lower extremity duplex showed no DVT. 08/16/2019 - 08/17/2019 with right lower extremity cellulitis.  He was unable to bear weight.  He was treated with IV fluids, NSAIDs, colchicine , and broad antibiotics (vancomycin  and Cefepime ).  He was discharged on indomethacin  x 5 days and Keflex  500 mg TID x 5 days.  10/05/2020, colonoscopy showed internal hemorrhoids.  Otherwise normal examination.  INTERVAL HISTORY Jared Gutierrez is a 83 y.o. male who has above history reviewed by me today presents for follow up visit for management of recurrent metastatic colon cancer + acid reflux and stomach discomfort after eating.  Weight is stable.   Review of Systems  Constitutional:  Negative for appetite change, chills, diaphoresis, fever and unexpected weight change.  HENT:   Negative for hearing loss, nosebleeds, sore throat and tinnitus.   Respiratory:  Negative for cough, hemoptysis and shortness of breath.   Cardiovascular:  Negative for chest pain and palpitations.  Gastrointestinal:  Negative for  abdominal pain, blood in stool, constipation, diarrhea, nausea and vomiting.  Genitourinary:  Negative for dysuria, frequency and hematuria.   Musculoskeletal:  Positive for arthralgias. Negative for back pain, myalgias and neck pain.  Skin:  Negative for itching and rash.  Neurological:  Positive for numbness. Negative for dizziness and headaches.  Hematological:  Does not bruise/bleed easily.  Psychiatric/Behavioral:  Negative for depression. The patient is not nervous/anxious.       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   Chicken pox    Colon cancer (HCC)    History of kidney stones    Hypertension    Lipoma of colon    Nephrolithiasis    Nephrolithiasis    Obesity    Shingles    Tubular adenoma of colon    multiple fragments    Past Surgical History:  Procedure Laterality Date   COLON  SURGERY     COLONOSCOPY N/A 10/02/2014   Procedure: COLONOSCOPY;  Surgeon: Luella Sager, MD;  Location: [facility] ENDOSCOPY;  Service: Endoscopy;  Laterality: N/A;   COLONOSCOPY WITH PROPOFOL  N/A 02/16/2017   Procedure: COLONOSCOPY WITH PROPOFOL ;  Surgeon: Cassie Click, MD;  Location: [facility] ENDOSCOPY;  Service: Endoscopy;  Laterality: N/A;   COLONOSCOPY WITH PROPOFOL  N/A 10/05/2020   Procedure: COLONOSCOPY WITH PROPOFOL ;  Surgeon: Shane Darling, MD;  Location: [facility] ENDOSCOPY;  Service: Endoscopy;  Laterality: N/A;   EUS N/A 04/17/2019   Procedure: FULL UPPER ENDOSCOPIC ULTRASOUND (EUS) RADIAL;  Surgeon: Eloisa Hait, MD;  Location: [facility] ENDOSCOPY;  Service: Gastroenterology;  Laterality: N/A;   KIDNEY STONE SURGERY     PARTIAL COLECTOMY  10/17/2013   PORTACATH PLACEMENT Right 06/13/2019   Procedure: INSERTION PORT-A-CATH;  Surgeon: Conrado Delay, DO;  Location: [facility] ORS;  Service: General;  Laterality: Right;    Family History  Problem Relation Age of Onset   Cancer Mother     Breast cancer Mother    COPD Father     Social History:  reports that he has never smoked. He has never used smokeless tobacco. He reports current alcohol use. He reports that he does not use drugs.    Allergies: No Known Allergies  Current Medications: Current Outpatient Medications  Medication Sig Dispense Refill   acetaminophen  (TYLENOL ) 500 MG tablet Take 500 mg by mouth every 6 (six) hours as needed.     amLODipine (NORVASC) 2.5 MG tablet Take 2.5 mg by mouth daily.     aspirin EC 81 MG tablet Take 81 mg by mouth daily.     Cholecalciferol (VITAMIN D) 125 MCG (5000 UT) CAPS Take 5,000 mg by mouth.     cyanocobalamin (VITAMIN B12) 500 MCG tablet Take by mouth.     Ferrous Fumarate (HEMOCYTE - 106 MG FE) 324 (106 Fe) MG TABS tablet Take 1 tablet by mouth.     furosemide (LASIX) 20 MG tablet Take 20 mg by mouth as needed. Prn for swelling     lidocaine -prilocaine  (EMLA ) cream Apply small amount to port and cover with saran wrap 1-2 hours prior to port access 30 g 3   loperamide  (IMODIUM ) 2 MG capsule Take 1 capsule (2 mg total) by mouth See admin instructions. Take 2 tablets after first loose stool,  then 1 tablet  after each loose stool; maximum: 8 tablets /day 60 capsule 0   loratadine (CLARITIN) 10 MG tablet Take 10 mg by mouth daily.     olmesartan (BENICAR) 20 MG tablet Take 1 tablet by mouth daily.     omeprazole (PRILOSEC) 20 MG capsule Take 1 capsule (20 mg total) by mouth daily. 30 capsule 3   ondansetron  (ZOFRAN ) 8 MG tablet Take 1 tablet (8 mg total) by mouth 2 (two) times daily as needed for refractory nausea / vomiting. Start on day 3 after chemotherapy. 60 tablet 1   potassium chloride  SA (KLOR-CON  M20) 20 MEQ tablet Take 1 tablet (20 mEq total) by mouth daily. 180 tablet 0   Probiotic Product (PROBIOTIC DAILY PO) Take 1 tablet by mouth daily.     prochlorperazine  (COMPAZINE ) 10 MG tablet Take 1 tablet (10 mg total) by mouth every 6 (six) hours as needed (NAUSEA). 30 tablet  1   senna-docusate (SENNA S) 8.6-50 MG tablet Take 2 tablets by mouth daily. 60 tablet 3   tamsulosin  (FLOMAX ) 0.4 MG CAPS capsule Take 1 capsule (0.4 mg total) by mouth daily after  supper. 90 capsule 3   docusate sodium  (COLACE) 100 MG capsule Take 1 capsule (100 mg total) by mouth 2 (two) times daily as needed for mild constipation or moderate constipation. (Patient not taking: Reported on 09/18/2023) 60 capsule 3   HYDROcodone -acetaminophen  (NORCO/VICODIN) 5-325 MG tablet Take 1 tablet by mouth every 6 (six) hours as needed for moderate pain (up to 3 doses for moderate pain.). (Patient not taking: Reported on 09/18/2023)     megestrol  (MEGACE ) 40 MG tablet TAKE 2 TABLETS BY MOUTH 2 TIMES DAILY. (Patient not taking: Reported on 09/18/2023) 360 tablet 1   No current facility-administered medications for this visit.   Facility-Administered Medications Ordered in Other Visits  Medication Dose Route Frequency Provider Last Rate Last Admin   0.9 %  sodium chloride  infusion   Intravenous Once Beverely Buba, Melissa C, MD       0.9 %  sodium chloride  infusion   Intravenous Continuous Corcoran, Melissa C, MD 10 mL/hr at 12/31/19 1000 New Bag at 10/05/20 1045   0.9 %  sodium chloride  infusion   Intravenous Once Johntae Broxterman, MD       bevacizumab -adcd (VEGZELMA ) 500 mg in sodium chloride  0.9 % 100 mL chemo infusion  5 mg/kg (Order-Specific) Intravenous Once Aasiyah Auerbach, MD       fluorouracil  (ADRUCIL ) 5,000 mg in sodium chloride  0.9 % 150 mL chemo infusion  2,400 mg/m2 (Order-Specific) Intravenous 1 day or 1 dose Jared Forbes, MD       heparin  lock flush 100 unit/mL  500 Units Intravenous Once Corcoran, Melissa C, MD       heparin  lock flush 100 unit/mL  500 Units Intracatheter Once PRN Jared Forbes, MD       leucovorin  860 mg in sodium chloride  0.9 % 250 mL infusion  400 mg/m2 (Order-Specific) Intravenous Once Jared Forbes, MD       sodium chloride  flush (NS) 0.9 % injection 10 mL  10 mL Intracatheter PRN Jared Forbes, MD   10 mL at  02/01/23 1341   sodium chloride  flush (NS) 0.9 % injection 10 mL  10 mL Intracatheter PRN Jared Forbes, MD        Performance status (ECOG): 1  Vitals Blood pressure 118/78, pulse 66, temperature 98.4 F (36.9 C), resp. rate 18, weight 200 lb 11.2 oz (91 kg).   Physical Exam Constitutional:      General: He is not in acute distress.    Appearance: He is obese. He is not diaphoretic.  HENT:     Head: Normocephalic and atraumatic.  Eyes:     General: No scleral icterus. Cardiovascular:     Rate and Rhythm: Normal rate and regular rhythm.     Heart sounds: No murmur heard. Pulmonary:     Effort: Pulmonary effort is normal. No respiratory distress.     Breath sounds: No wheezing.     Comments: Decreased breath sound bilaterally.  Abdominal:     General: Bowel sounds are normal. There is no distension.     Palpations: Abdomen is soft.  Musculoskeletal:        General: Normal range of motion.     Cervical back: Normal range of motion and neck supple.     Comments: Trace edema bilaterally  Skin:    General: Skin is warm and dry.     Findings: No erythema.  Neurological:     Mental Status: He is alert and oriented to person, place, and time. Mental status is at baseline.  Motor: No abnormal muscle tone.  Psychiatric:        Mood and Affect: Mood and affect normal.     Labs were reviewed by me.     Latest Ref Rng & Units 09/18/2023    8:07 AM 08/28/2023    8:21 AM 08/27/2023    2:28 PM  CBC  WBC 4.0 - 10.5 K/uL 4.2  4.0  3.1   Hemoglobin 13.0 - 17.0 g/dL 47.8  29.5  62.1   Hematocrit 39.0 - 52.0 % 32.7  32.2  34.6   Platelets 150 - 400 K/uL 184  174  161       Latest Ref Rng & Units 09/18/2023    8:07 AM 08/28/2023    8:21 AM 08/27/2023    2:28 PM  CMP  Glucose 70 - 99 mg/dL 308  657  846   BUN 8 - 23 mg/dL 11  13  12    Creatinine 0.61 - 1.24 mg/dL 9.62  9.52  8.41   Sodium 135 - 145 mmol/L 131  137  136   Potassium 3.5 - 5.1 mmol/L 3.9  4.0  4.3   Chloride 98 - 111  mmol/L 100  105  101   CO2 22 - 32 mmol/L 25  24  25    Calcium  8.9 - 10.3 mg/dL 8.6  9.0  9.0   Total Protein 6.5 - 8.1 g/dL 5.7  5.8  5.9   Total Bilirubin 0.0 - 1.2 mg/dL 0.8  0.8  1.3   Alkaline Phos 38 - 126 U/L 50  49  44   AST 15 - 41 U/L 31  28  37   ALT 0 - 44 U/L 17  15  13

## 2023-09-19 ENCOUNTER — Other Ambulatory Visit: Payer: Self-pay

## 2023-09-19 LAB — CEA: CEA: 18.3 ng/mL — ABNORMAL HIGH (ref 0.0–4.7)

## 2023-09-20 ENCOUNTER — Inpatient Hospital Stay

## 2023-09-20 DIAGNOSIS — C786 Secondary malignant neoplasm of retroperitoneum and peritoneum: Secondary | ICD-10-CM

## 2023-09-20 DIAGNOSIS — Z5112 Encounter for antineoplastic immunotherapy: Secondary | ICD-10-CM | POA: Diagnosis not present

## 2023-09-20 DIAGNOSIS — Z85038 Personal history of other malignant neoplasm of large intestine: Secondary | ICD-10-CM

## 2023-09-20 MED ORDER — HEPARIN SOD (PORK) LOCK FLUSH 100 UNIT/ML IV SOLN
500.0000 [IU] | Freq: Once | INTRAVENOUS | Status: AC | PRN
  Administered 2023-09-20: 500 [IU]
  Filled 2023-09-20: qty 5

## 2023-09-25 ENCOUNTER — Encounter: Payer: Self-pay | Admitting: Oncology

## 2023-10-01 ENCOUNTER — Ambulatory Visit: Attending: Cardiology | Admitting: Cardiology

## 2023-10-01 ENCOUNTER — Encounter: Payer: Self-pay | Admitting: Cardiology

## 2023-10-01 VITALS — BP 130/78 | HR 68 | Ht 68.0 in | Wt 201.6 lb

## 2023-10-01 DIAGNOSIS — R55 Syncope and collapse: Secondary | ICD-10-CM | POA: Insufficient documentation

## 2023-10-01 DIAGNOSIS — I10 Essential (primary) hypertension: Secondary | ICD-10-CM | POA: Insufficient documentation

## 2023-10-01 NOTE — Progress Notes (Signed)
 Given pt weakness- respiratory panel order to evaluate for cause

## 2023-10-01 NOTE — Progress Notes (Signed)
 Cardiology Office Note:    Date:  10/01/2023   ID:  Jared Gutierrez, DOB 1940/07/09, MRN [id number]  PCP:  Antonio Baumgarten, MD   [facility] Providers Cardiologist:  Constancia Delton, MD     Referring MD: Antonio Baumgarten, MD   No chief complaint on file.   History of Present Illness:    Jared Gutierrez is a 83 y.o. male with a hx of hypertension, metastatic colon cancer on chemo presenting for follow-up.    Previously evaluated due to an episode of syncope in the context of getting chemo access.  Became dizzy and passed out for a few seconds.  Workup in the ED was unrevealing.  Has not had any further episodes since.  Cardiac monitor placed to evaluate cardiac etiology.  Patient endorses not drinking a lot of water.  He has symptoms were deemed possibly from dehydration.  Prior notes/testing Echocardiogram obtained 02/2023 EF 60 to 65%, impaired relaxation.  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   Chicken pox    Colon cancer (HCC)    History of kidney stones    Hypertension    Lipoma of colon    Nephrolithiasis    Nephrolithiasis    Obesity    Shingles    Tubular adenoma of colon    multiple fragments    Past Surgical History:  Procedure Laterality Date   COLON SURGERY     COLONOSCOPY N/A 10/02/2014   Procedure: COLONOSCOPY;  Surgeon: Luella Sager, MD;  Location: [facility] ENDOSCOPY;  Service: Endoscopy;  Laterality: N/A;   COLONOSCOPY WITH PROPOFOL  N/A 02/16/2017   Procedure: COLONOSCOPY WITH PROPOFOL ;  Surgeon: Cassie Click, MD;  Location: [facility] ENDOSCOPY;  Service: Endoscopy;  Laterality: N/A;   COLONOSCOPY WITH PROPOFOL  N/A 10/05/2020   Procedure: COLONOSCOPY WITH PROPOFOL ;  Surgeon: Shane Darling, MD;  Location: [facility] ENDOSCOPY;  Service: Endoscopy;  Laterality: N/A;   EUS N/A 04/17/2019   Procedure: FULL UPPER ENDOSCOPIC ULTRASOUND (EUS)  RADIAL;  Surgeon: Eloisa Hait, MD;  Location: [facility] ENDOSCOPY;  Service: Gastroenterology;  Laterality: N/A;   KIDNEY STONE SURGERY     PARTIAL COLECTOMY  10/17/2013   PORTACATH PLACEMENT Right 06/13/2019   Procedure: INSERTION PORT-A-CATH;  Surgeon: Conrado Delay, DO;  Location: [facility] ORS;  Service: General;  Laterality: Right;    Current Medications: Current Meds  Medication Sig   acetaminophen  (TYLENOL ) 500 MG tablet Take 500 mg by mouth every 6 (six) hours as needed.   amLODipine (NORVASC) 2.5 MG tablet Take 2.5 mg by mouth daily.   aspirin EC 81 MG tablet Take 81 mg by mouth daily.   Cholecalciferol (VITAMIN D) 125 MCG (5000 UT) CAPS Take 5,000 mg by mouth.   cyanocobalamin (VITAMIN B12) 500 MCG tablet Take by mouth.   docusate sodium  (COLACE) 100 MG capsule Take 1 capsule (100 mg total) by mouth 2 (two) times daily as needed for mild constipation or moderate constipation.   Ferrous Fumarate (HEMOCYTE - 106 MG FE) 324 (106 Fe) MG TABS tablet Take 1 tablet by mouth.   furosemide (LASIX) 20 MG tablet Take 20 mg by mouth as needed. Prn for swelling   HYDROcodone -acetaminophen  (NORCO/VICODIN) 5-325 MG tablet Take 1 tablet by mouth every 6 (six) hours as needed for moderate pain (pain score 4-6) (up to 3 doses for moderate pain.).   lidocaine -prilocaine  (EMLA ) cream  Apply small amount to port and cover with saran wrap 1-2 hours prior to port access   loperamide  (IMODIUM ) 2 MG capsule Take 1 capsule (2 mg total) by mouth See admin instructions. Take 2 tablets after first loose stool,  then 1 tablet  after each loose stool; maximum: 8 tablets /day   loratadine (CLARITIN) 10 MG tablet Take 10 mg by mouth daily.   megestrol  (MEGACE ) 40 MG tablet TAKE 2 TABLETS BY MOUTH 2 TIMES DAILY.   olmesartan (BENICAR) 20 MG tablet Take 1 tablet by mouth daily.   omeprazole  (PRILOSEC) 20 MG capsule Take 1 capsule (20 mg total) by mouth daily.   ondansetron  (ZOFRAN ) 8 MG tablet Take 1 tablet (8 mg total)  by mouth 2 (two) times daily as needed for refractory nausea / vomiting. Start on day 3 after chemotherapy.   potassium chloride  SA (KLOR-CON  M20) 20 MEQ tablet Take 1 tablet (20 mEq total) by mouth daily.   Probiotic Product (PROBIOTIC DAILY PO) Take 1 tablet by mouth daily.   prochlorperazine  (COMPAZINE ) 10 MG tablet Take 1 tablet (10 mg total) by mouth every 6 (six) hours as needed (NAUSEA).   senna-docusate (SENNA S) 8.6-50 MG tablet Take 2 tablets by mouth daily.   tamsulosin  (FLOMAX ) 0.4 MG CAPS capsule Take 1 capsule (0.4 mg total) by mouth daily after supper.     Allergies:   Patient has no known allergies.   Social History   Socioeconomic History   Marital status: Married    Spouse name: Not on file   Number of children: Not on file   Years of education: Not on file   Highest education level: Not on file  Occupational History   Not on file  Tobacco Use   Smoking status: Never   Smokeless tobacco: Never  Vaping Use   Vaping status: Never Used  Substance and Sexual Activity   Alcohol use: Yes    Comment: socially    Drug use: No   Sexual activity: Not on file  Other Topics Concern   Not on file  Social History Narrative   Not on file   Social Drivers of Health   Financial Resource Strain: Not on file  Food Insecurity: Not on file  Transportation Needs: Not on file  Physical Activity: Not on file  Stress: Not on file  Social Connections: Not on file     Family History: The patient's family history includes Breast cancer in his mother; COPD in his father; Cancer in his mother.  ROS:   Please see the history of present illness.     All other systems reviewed and are negative.  EKGs/Labs/Other Studies Reviewed:    The following studies were reviewed today:       Recent Labs: 01/30/2023: B Natriuretic Peptide 35.1 09/18/2023: ALT 17; BUN 11; Creatinine, Ser 0.55; Hemoglobin 11.2; Platelets 184; Potassium 3.9; Sodium 131  Recent Lipid Panel    Component  Value Date/Time   CHOL 151 01/03/2022 0829   TRIG 125 01/03/2022 0829   HDL 47 01/03/2022 0829   CHOLHDL 3.2 01/03/2022 0829   VLDL 25 01/03/2022 0829   LDLCALC 79 01/03/2022 0829     Risk Assessment/Calculations:         Physical Exam:    VS:  BP 130/78 (BP Location: Left Arm, Patient Position: Sitting, Cuff Size: Normal)   Pulse 68   Ht 5\' 8"  (1.727 m)   Wt 201 lb 9.6 oz (91.4 kg)   SpO2 98%  BMI 30.65 kg/m     Wt Readings from Last 3 Encounters:  10/01/23 201 lb 9.6 oz (91.4 kg)  09/18/23 200 lb 11.2 oz (91 kg)  08/28/23 201 lb 11.2 oz (91.5 kg)     GEN:  Well nourished, well developed in no acute distress HEENT: Normal NECK: No JVD; No carotid bruits CARDIAC: RRR, no murmurs, rubs, gallops RESPIRATORY:  Clear to auscultation without rales, wheezing or rhonchi  ABDOMEN: Soft, non-tender, non-distended MUSCULOSKELETAL:  No edema; No deformity  SKIN: Warm and dry NEUROLOGIC:  Alert and oriented x 3 PSYCHIATRIC:  Normal affect   ASSESSMENT:    1. Syncope and collapse   2. Primary hypertension    PLAN:    In order of problems listed above:  Syncope, cardiac monitor/2025 showed normal sustained arrhythmias, no A-fib or flutter, no findings to suggest etiology of syncope.  Echo 10/24 EF 60 to 65%.  Etiology appears vasovagal versus dehydration.  Safety measures when prodromal symptoms arise advised.  Adequate hydration also emphasized. Hypertension, BP controlled..  Continue Benicar 20 mg daily, Norvasc 2.5 mg daily.  Follow-up as needed     Medication Adjustments/Labs and Tests Ordered: Current medicines are reviewed at length with the patient today.  Concerns regarding medicines are outlined above.  No orders of the defined types were placed in this encounter.  No orders of the defined types were placed in this encounter.   Patient Instructions  Medication Instructions:  Your Physician recommend you continue on your current medication as directed.     *If you need a refill on your cardiac medications before your next appointment, please call your pharmacy*  Lab Work: No labs ordered today  If you have labs (blood work) drawn today and your tests are completely normal, you will receive your results only by: MyChart Message (if you have MyChart) OR A paper copy in the mail If you have any lab test that is abnormal or we need to change your treatment, we will call you to review the results.  Testing/Procedures: No test ordered today   Follow-Up: At [facility], you and your health needs are our priority.  As part of our continuing mission to provide you with exceptional heart care, our providers are all part of one team.  This team includes your primary Cardiologist (physician) and Advanced Practice Providers or APPs (Physician Assistants and Nurse Practitioners) who all work together to provide you with the care you need, when you need it.  Your next appointment:   Follow up as needed  We recommend signing up for the patient portal called "MyChart".  Sign up information is provided on this After Visit Summary.  MyChart is used to connect with patients for Virtual Visits (Telemedicine).  Patients are able to view lab/test results, encounter notes, upcoming appointments, etc.  Non-urgent messages can be sent to your provider as well.   To learn more about what you can do with MyChart, go to [email or web address].      Signed, Constancia Delton, MD  10/01/2023 12:49 PM    [facility]

## 2023-10-01 NOTE — Patient Instructions (Signed)
 Medication Instructions:  Your Physician recommend you continue on your current medication as directed.    *If you need a refill on your cardiac medications before your next appointment, please call your pharmacy*  Lab Work: No labs ordered today  If you have labs (blood work) drawn today and your tests are completely normal, you will receive your results only by: MyChart Message (if you have MyChart) OR A paper copy in the mail If you have any lab test that is abnormal or we need to change your treatment, we will call you to review the results.  Testing/Procedures: No test ordered today   Follow-Up: At [facility], you and your health needs are our priority.  As part of our continuing mission to provide you with exceptional heart care, our providers are all part of one team.  This team includes your primary Cardiologist (physician) and Advanced Practice Providers or APPs (Physician Assistants and Nurse Practitioners) who all work together to provide you with the care you need, when you need it.  Your next appointment:   Follow up as needed.   We recommend signing up for the patient portal called "MyChart".  Sign up information is provided on this After Visit Summary.  MyChart is used to connect with patients for Virtual Visits (Telemedicine).  Patients are able to view lab/test results, encounter notes, upcoming appointments, etc.  Non-urgent messages can be sent to your provider as well.   To learn more about what you can do with MyChart, go to [email or web address].

## 2023-10-02 ENCOUNTER — Inpatient Hospital Stay

## 2023-10-02 ENCOUNTER — Encounter: Payer: Self-pay | Admitting: Oncology

## 2023-10-02 ENCOUNTER — Inpatient Hospital Stay (HOSPITAL_BASED_OUTPATIENT_CLINIC_OR_DEPARTMENT_OTHER): Admitting: Oncology

## 2023-10-02 VITALS — BP 95/64 | HR 80 | Temp 97.8°F | Resp 17 | Wt 200.0 lb

## 2023-10-02 DIAGNOSIS — R918 Other nonspecific abnormal finding of lung field: Secondary | ICD-10-CM

## 2023-10-02 DIAGNOSIS — Z5112 Encounter for antineoplastic immunotherapy: Secondary | ICD-10-CM | POA: Diagnosis not present

## 2023-10-02 DIAGNOSIS — C786 Secondary malignant neoplasm of retroperitoneum and peritoneum: Secondary | ICD-10-CM | POA: Diagnosis not present

## 2023-10-02 DIAGNOSIS — Z5111 Encounter for antineoplastic chemotherapy: Secondary | ICD-10-CM

## 2023-10-02 DIAGNOSIS — Z85038 Personal history of other malignant neoplasm of large intestine: Secondary | ICD-10-CM

## 2023-10-02 DIAGNOSIS — D6481 Anemia due to antineoplastic chemotherapy: Secondary | ICD-10-CM

## 2023-10-02 DIAGNOSIS — E871 Hypo-osmolality and hyponatremia: Secondary | ICD-10-CM

## 2023-10-02 DIAGNOSIS — T451X5A Adverse effect of antineoplastic and immunosuppressive drugs, initial encounter: Secondary | ICD-10-CM

## 2023-10-02 DIAGNOSIS — C184 Malignant neoplasm of transverse colon: Secondary | ICD-10-CM | POA: Diagnosis not present

## 2023-10-02 DIAGNOSIS — E861 Hypovolemia: Secondary | ICD-10-CM

## 2023-10-02 LAB — CBC WITH DIFFERENTIAL/PLATELET
Abs Immature Granulocytes: 0.01 10*3/uL (ref 0.00–0.07)
Basophils Absolute: 0 10*3/uL (ref 0.0–0.1)
Basophils Relative: 1 %
Eosinophils Absolute: 0.2 10*3/uL (ref 0.0–0.5)
Eosinophils Relative: 4 %
HCT: 31.9 % — ABNORMAL LOW (ref 39.0–52.0)
Hemoglobin: 10.6 g/dL — ABNORMAL LOW (ref 13.0–17.0)
Immature Granulocytes: 0 %
Lymphocytes Relative: 17 %
Lymphs Abs: 0.8 10*3/uL (ref 0.7–4.0)
MCH: 31.8 pg (ref 26.0–34.0)
MCHC: 33.2 g/dL (ref 30.0–36.0)
MCV: 95.8 fL (ref 80.0–100.0)
Monocytes Absolute: 0.4 10*3/uL (ref 0.1–1.0)
Monocytes Relative: 8 %
Neutro Abs: 3.2 10*3/uL (ref 1.7–7.7)
Neutrophils Relative %: 70 %
Platelets: 158 10*3/uL (ref 150–400)
RBC: 3.33 MIL/uL — ABNORMAL LOW (ref 4.22–5.81)
RDW: 13.5 % (ref 11.5–15.5)
WBC: 4.6 10*3/uL (ref 4.0–10.5)
nRBC: 0 % (ref 0.0–0.2)

## 2023-10-02 LAB — COMPREHENSIVE METABOLIC PANEL WITH GFR
ALT: 14 U/L (ref 0–44)
AST: 26 U/L (ref 15–41)
Albumin: 3.2 g/dL — ABNORMAL LOW (ref 3.5–5.0)
Alkaline Phosphatase: 45 U/L (ref 38–126)
Anion gap: 7 (ref 5–15)
BUN: 15 mg/dL (ref 8–23)
CO2: 24 mmol/L (ref 22–32)
Calcium: 8.6 mg/dL — ABNORMAL LOW (ref 8.9–10.3)
Chloride: 105 mmol/L (ref 98–111)
Creatinine, Ser: 0.63 mg/dL (ref 0.61–1.24)
GFR, Estimated: >60 mL/min (ref >60)
Glucose, Bld: 158 mg/dL — ABNORMAL HIGH (ref 70–99)
Potassium: 3.9 mmol/L (ref 3.5–5.1)
Sodium: 136 mmol/L (ref 135–145)
Total Bilirubin: 1 mg/dL (ref 0.0–1.2)
Total Protein: 5.8 g/dL — ABNORMAL LOW (ref 6.5–8.1)

## 2023-10-02 LAB — PROTEIN, URINE, RANDOM: Total Protein, Urine: 17 mg/dL

## 2023-10-02 MED ORDER — HEPARIN SOD (PORK) LOCK FLUSH 100 UNIT/ML IV SOLN
500.0000 [IU] | Freq: Once | INTRAVENOUS | Status: AC
Start: 2023-10-02 — End: 2023-10-02
  Administered 2023-10-02: 500 [IU] via INTRAVENOUS
  Filled 2023-10-02: qty 5

## 2023-10-02 NOTE — Assessment & Plan Note (Signed)
 Stable  small lung nodule

## 2023-10-02 NOTE — Assessment & Plan Note (Signed)
 Chemotherapy plan as listed above

## 2023-10-02 NOTE — Progress Notes (Signed)
 Patient here for oncology follow-up appointment, expresses no complaints or concerns at this time.

## 2023-10-02 NOTE — Assessment & Plan Note (Signed)
 CT images shows slight lung progression -Finished palliative radiation on 03/05/2023. March 2025 CT scan shows stable disease.  Labs are reviewed and discussed with patient. Hold 5-FU and Bevavizumab due to low BP

## 2023-10-02 NOTE — Assessment & Plan Note (Signed)
 Decreased from baseline.  Continue to monitor.

## 2023-10-02 NOTE — Assessment & Plan Note (Signed)
 Recommend patient to increase oral hydration.

## 2023-10-02 NOTE — Assessment & Plan Note (Signed)
 Encourage oral hydration.  No clinical signs of infection.  Recommend BP monitoring at home prior to taking BP meds.

## 2023-10-02 NOTE — Progress Notes (Signed)
 Hematology/Oncology Progress note [phone] Fax:([phone]) [phone]    Chief Complaint: Jared Gutierrez is a 83 y.o.  male presents for follow-up of metastatic colon cancer   ASSESSMENT & PLAN:   Cancer Staging  Cancer of transverse colon [facility]) Staging form: Colon and Rectum, [name], [name], cM1) - Signed by Timmy Forbes, MD on 10/26/2021   Cancer of transverse colon [facility]) CT images shows slight lung progression -Finished palliative radiation on 03/05/2023. March 2025 CT scan shows stable disease.  Labs are reviewed and discussed with patient. Hold 5-FU and Bevavizumab due to low BP  Anemia due to chemotherapy Decreased from baseline.  Continue to monitor.  Encounter for antineoplastic chemotherapy Chemotherapy plan as listed above.   Hyponatremia Recommend patient to increase oral hydration.    Lung nodules Stable  small lung nodule  Hypotension due to hypovolemia Encourage oral hydration.  No clinical signs of infection.  Recommend BP monitoring at home prior to taking BP meds.     Follow-up 2 weeks  All questions were answered. The patient knows to call the clinic with any problems, questions or concerns.  Timmy Forbes, MD, PhD [facility] Hematology Oncology 10/02/2023     PERTINENT ONCOLOGY HISTORY Jared Gutierrez is a 83 y.o.[place] who has above oncology history reviewed by me today presented for follow up visit for management of Metastatic colon cancer. [name], patient switched care to me on 11/11/20 Extensive medical record review was performed by me  Oncology History  History of colon cancer, stage I  10/02/2014 Pathology Results   [id number] colonoscopy pathology A. CECAL POLYPS; HOT SNARE AND COLD BIOPSY:  - TUBULAR ADENOMA, MULTIPLE FRAGMENTS.  - NEGATIVE FOR HIGH-GRADE DYSPLASIA AND MALIGNANCY.   B. COLON POLYP, TRANSVERSE; COLD BIOPSY:  - TUBULAR ADENOMA.   - NEGATIVE FOR HIGH-GRADE DYSPLASIA AND MALIGNANCY.     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  Tumor extended into the submucosa. Margins were negative. + lymphovascular invasion. 14 lymph nodes were negative. Pathologic stage was T1 N0.  TRANSVERSE COLON, RESECTION:  - INVASIVE ADENOCARCINOMA ARISING IN A 4.6 CM TUBULOVILLOUS  ADENOMA WITH HIGH GRADE DYSPLASIA (MALIGNANT POLYP).  - SEE SUMMARY BELOW.  Jared Gutierrez  ONCOLOGY SUMMARY: COLON AND RECTUM, RESECTION, AJCC 7TH EDITION  Specimen: transverse colon  Procedure: transverse colon resection  Tumor site: splenic flexure  Tumor size: 1.0 cm (invasive component)  Macroscopic tumor perforation: not specified  Histologic type: invasive adenocarcinoma  Histologic grade: moderately differentiated  Microscopic tumor extension: into submucosa  Margins:       Proximal margin: negative       Distal margin: negative  Circumferential (radial) or mesenteric margin: negative       If all margins uninvolved by invasive carcinoma:       Distance of invasive carcinoma from closest margin: 7.5 cm  to distal margin  Treatment effect: not applicable  Lymph-vascular invasion: present   Perineural invasion: not identified  Tumor deposits (discontinuous extramural extension): not  identified  Pathologic staging:       Primary tumor: pT1       Regional lymph nodes: pN0            Number of nodes examined: 14            Number of nodes involved: 0    04/02/2019 Progression   04/02/2019  PET scan  limited evealed a 2.4 x 2.3 cm (SUV 11) hypermetabolic soft tissue density caudal and anterior to the pancreatic neck favored to represent isolated peritoneal or nodal metastasis in the setting of prior transverse colonic resection (expected primary drainage). Although this was immediately adjacent to the pancreas, a fat plane was maintained, arguing strongly against a pancreatic primary. Otherwise, there was no evidence of hypermetabolic metastasis. 04/17/2019 EUS on  revealed a normal esophagus, stomach, duodenum, and pancreas.  There was a 2.4 x 2.4 cm irregular mass in the retroperitoneum adjacent to, but not involving the pancreatic neck.  FNA and core needle biopsy were performed.  Pathology revealed adenocarcinoma compatible with a metastatic lesion of colorectal origin.  Tumor cells were positive for CK20 and CDX2 and negative for CK7.  NGS: [employer] on 05/15/2019 revealed + [id number] and TP53.  Negative results included BRAF V600E, Her2, NRAS, NTRK, PD-L1 (<1%), and TMB 8.7/Mb (intermediate).  MMR testing from his colon resection on 10/16/2013 was intact with a low probability of MSI-H.   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  He received Neulasta  after cycle #4 and #5 secondary to progressive leukopenia.  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  He received a truncated course of FOLFOX with cycle #11 secondary to oxaliplatin  reaction.   01/14/2020 Imaging    PET revealed an interval decrease in size and FDG uptake  (2.4 x 2.3 cm with SUV 11 to 2.4 x 1.7 cm with SUV 4.09) associated with the previously referenced soft tissue density caudal and anterior to the pancreatic neck suggesting treatment response. There were no new sites of FDG avid tumor.   08/31/2020 Imaging   08/31/2020 Abdomen and pelvis CT revealed increased size of the index soft tissue lesion inferior and anterior to the pancreatic neck (2.9 x 1.8 cm to 3.5 x 2.9 cm). There were similar prominent retroperitoneal lymph nodes without adenopathy by size criteria. There was no new or enlarging abdominal or pelvic lymph nodes. There were no new interval findings. There was similar circumferential wall thickening of a nondistended urinary bladder, which likely accentuated wall thickening There was hepatic steatosis and aortic atherosclerosis.   11/03/2020, PET showed recurrent peritoneal metastasis in the upper abdomen adjacent to the pancreas.  The lesion is 3.8 x 2.9 cm with SUV of 11.3. No evidence of metastatic peritoneal disease elsewhere in the abdomen pelvis.  No evidence of distant metastasis.   11/03/2020 Imaging   PET scan showed recurrent peritoneal metastasis near pancreas. Given that he has no other distant metastasis. Discussed with radiation oncology. Repeat SBRT may be considered if he does  not respond well to systemic chemotherapy.     11/26/2020 -  Chemotherapy   5-FU and bevacizumab .   02/17/2021 Imaging   02/17/2021 CT abdomen pelvis showed partial response. Mild decrease of peritoneal lesion.  Proceed with 5-FU and bevacizumab  today.  Given that he is tolerating current regimen with good life quality, partial response.  Shared decision was made to hold off adding additional chemotherapy agents.   06/08/2021, CT chest abdomen pelvis with contrast showed soft tissue mass at the base of mesentery inferior to the pancreatic neck is unchanged in size.  No progressive disease was identified.   09/07/2021, CT chest abdomen pelvis with  contrast showed no substantial changes in size of the mesenteric mass, 2.6 cm.  No suspicious new lesions.  Chronic findings as detailed in the imaging report.   12/05/2021 Imaging   PET scan showed soft tissue mesenteric mass is unchanged in size.  Decreased FDG uptake compared to activity on November 03, 2020.  Fever treated disease.  No new metastatic disease.   03/21/2022 Imaging   CT chest abdomen pelvis  1. Similar size of the peripancreatic presumed peritoneal implant compared to 09/07/2021. 2. No new or progressive disease. 3. Incidental findings, including: Coronary artery atherosclerosis.Aortic Atherosclerosis (ICD10-I70.0). Prostatomegaly with chronic bladder wall thickening, suggesting outlet obstruction.     07/11/2022 Imaging   CT chest abdomen pelvis with contrast showed 1. New 6 mm nodule in the right upper lobe and 2 mm nodule in the left upper lobe. Although small these are concerning. Recommend short follow-up in 3 months 2. Stable low-attenuation lesion along the midbody of the pancreas. Continued attention on follow-up. No new peritoneal mass lesions or ascites. 3. Fatty liver infiltration 4. Colonic surgical changes   10/03/2022 Imaging   CT chest abdomen pelvis w contrast showed 1. Slight interval enlargement and cavitation of a nodule in the right upper lobe, measuring 0.6 cm, previously 0.5 cm. Slight interval enlargement of a left upper lobe nodule measuring 0.3 cm,previously 0.2 cm. Findings are concerning for slowly enlarging small pulmonary metastases. 2. No significant change in a lobulated soft tissue mass within the central small bowel mesentery abutting the inferior aspect of the pancreas, consistent with a stable, treated metastasis. 3. No other evidence of lymphadenopathy or metastatic disease in the chest, abdomen, or pelvis. 4. Prostatomegaly with diffuse wall thickening of the relatively decompressed urinary bladder, likely secondary to chronic outlet  obstruction. 5. Unchanged fibrotic scarring and bronchiectasis of the bilateral lung bases. This could be further assessed by pulmonary referral and dedicated interstitial lung disease protocol CT examination of the chest if and when clinically appropriate in the setting of known metastatic malignancy. Aortic Atherosclerosis    12/27/2022 Imaging   CT chest abdomen pelvis w contrast showed 1. Continued slight interval enlargement of a cavitary nodule in the right upper lobe measuring 0.7 x 0.6 cm, as well as a nodule in the peripheral left upper lobe measuring 0.5 cm. These are highly suspicious for slowly enlarging pulmonary metastases but remain below size threshold for reliable PET-CT characterization. 2. Unchanged, lobulated hypodense soft tissue mass in the central small bowel mesentery, consistent with treated metastatic disease. 3. Status post transverse colon resection and reanastomosis. 4. Thickening of the distal sigmoid colon and rectum, consistent with nonspecific infectious or inflammatory colitis. 5. Prostatomegaly with diffuse thickening of the urinary bladder wall, likely secondary to chronic outlet obstruction.   Aortic Atherosclerosis (ICD10-I70.0).   01/09/2023 Imaging   PET scan showed Status post transverse  colectomy. 4.2 cm soft tissue mass in the central abdominal mesentery, suspicious for residual/recurrent nodal metastasis. Additional 8 mm short axis left para-aortic nodal metastasis. 7 mm cavitary right upper lobe nodule and 5 mm left upper lobe nodule, both beneath the size threshold for PET sensitivity, but suspicious for small pulmonary metastases.   01/30/2023 -  Radiation Therapy   Palliative radiation to mesenteric soft tissue mass.    05/07/2023 Imaging   CT chest abdomen pelvis w contrast showed  Focal nodular area along the inferior margin of the midbody of the pancreas similar to previous. There is also some abnormal soft tissue extending into the porta  hepatis encasing the hepatic artery which compared to previous is also slightly increased.   New small soft tissue nodule adjacent to the SMA proximally.   Previous hypermetabolic node left para-aortic is similar in size to previous.   Stable bilateral small lung nodules. Right lesions again cavitary. No new lung nodules.   Stable surgical changes.  Chest port.     Imaging     08/06/2023 Imaging   CT chest abdomen pelvis w contrast showed Small nodules are again seen. These appears slightly smaller overall from previous. No new lung nodule identified at this time.   Lesion along the inferior margin of the midbody of the pancreas similar to the prior examination. Persistent ill-defined soft tissue extending along the porta hepatis encasing the course of the [facility] artery. Small lymph nodes elsewhere in the upper retroperitoneum are stable.      Other medical problems Chronic lower extremity edema. 12/24/2018, right lower extremity duplex negative for DVT.  Small right Baker's cyst. 08/16/2019, bilateral lower extremity duplex showed no DVT. 08/16/2019 - 08/17/2019 with right lower extremity cellulitis.  He was unable to bear weight.  He was treated with IV fluids, NSAIDs, colchicine , and broad antibiotics (vancomycin  and Cefepime ).  He was discharged on indomethacin  x 5 days and Keflex  500 mg TID x 5 days.  10/05/2020, colonoscopy showed internal hemorrhoids.  Otherwise normal examination.  INTERVAL HISTORY DALANTE MINUS is a 83 y.o. male who has above history reviewed by me today presents for follow up visit for management of recurrent metastatic colon cancer Appetite is fair, stable weight.  He did not drink much water this morning. BP is low 95/64. He denies dizziness.  No nausea vomiting diarrhea, fever chills.   Review of Systems  Constitutional:  Negative for appetite change, chills, diaphoresis, fever and unexpected weight change.  HENT:   Negative for hearing loss,  nosebleeds, sore throat and tinnitus.   Respiratory:  Negative for cough, hemoptysis and shortness of breath.   Cardiovascular:  Negative for chest pain and palpitations.  Gastrointestinal:  Negative for abdominal pain, blood in stool, constipation, diarrhea, nausea and vomiting.  Genitourinary:  Negative for dysuria, frequency and hematuria.   Musculoskeletal:  Positive for arthralgias. Negative for back pain, myalgias and neck pain.  Skin:  Negative for itching and rash.  Neurological:  Positive for numbness. Negative for dizziness and headaches.  Hematological:  Does not bruise/bleed easily.  Psychiatric/Behavioral:  Negative for depression. The patient is not nervous/anxious.       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   Chicken pox    Colon cancer (HCC)    History of kidney stones    Hypertension    Lipoma of colon  Nephrolithiasis    Nephrolithiasis    Obesity    Shingles    Tubular adenoma of colon    multiple fragments    Past Surgical History:  Procedure Laterality Date   COLON SURGERY     COLONOSCOPY N/A 10/02/2014   Procedure: COLONOSCOPY;  Surgeon: Luella Sager, MD;  Location: [facility] ENDOSCOPY;  Service: Endoscopy;  Laterality: N/A;   COLONOSCOPY WITH PROPOFOL  N/A 02/16/2017   Procedure: COLONOSCOPY WITH PROPOFOL ;  Surgeon: Cassie Click, MD;  Location: [facility] ENDOSCOPY;  Service: Endoscopy;  Laterality: N/A;   COLONOSCOPY WITH PROPOFOL  N/A 10/05/2020   Procedure: COLONOSCOPY WITH PROPOFOL ;  Surgeon: Shane Darling, MD;  Location: [facility] ENDOSCOPY;  Service: Endoscopy;  Laterality: N/A;   EUS N/A 04/17/2019   Procedure: FULL UPPER ENDOSCOPIC ULTRASOUND (EUS) RADIAL;  Surgeon: Eloisa Hait, MD;  Location: [facility] ENDOSCOPY;  Service: Gastroenterology;  Laterality: N/A;   KIDNEY STONE SURGERY     PARTIAL COLECTOMY  10/17/2013   PORTACATH PLACEMENT Right  06/13/2019   Procedure: INSERTION PORT-A-CATH;  Surgeon: Conrado Delay, DO;  Location: [facility] ORS;  Service: General;  Laterality: Right;    Family History  Problem Relation Age of Onset   Cancer Mother    Breast cancer Mother    COPD Father     Social History:  reports that he has never smoked. He has never used smokeless tobacco. He reports current alcohol use. He reports that he does not use drugs.    Allergies: No Known Allergies  Current Medications: Current Outpatient Medications  Medication Sig Dispense Refill   acetaminophen  (TYLENOL ) 500 MG tablet Take 500 mg by mouth every 6 (six) hours as needed.     amLODipine (NORVASC) 2.5 MG tablet Take 2.5 mg by mouth daily.     aspirin EC 81 MG tablet Take 81 mg by mouth daily.     Cholecalciferol (VITAMIN D) 125 MCG (5000 UT) CAPS Take 5,000 mg by mouth.     cyanocobalamin (VITAMIN B12) 500 MCG tablet Take by mouth.     docusate sodium  (COLACE) 100 MG capsule Take 1 capsule (100 mg total) by mouth 2 (two) times daily as needed for mild constipation or moderate constipation. 60 capsule 3   Ferrous Fumarate (HEMOCYTE - 106 MG FE) 324 (106 Fe) MG TABS tablet Take 1 tablet by mouth.     furosemide (LASIX) 20 MG tablet Take 20 mg by mouth as needed. Prn for swelling     HYDROcodone -acetaminophen  (NORCO/VICODIN) 5-325 MG tablet Take 1 tablet by mouth every 6 (six) hours as needed for moderate pain (pain score 4-6) (up to 3 doses for moderate pain.).     lidocaine -prilocaine  (EMLA ) cream Apply small amount to port and cover with saran wrap 1-2 hours prior to port access 30 g 3   loperamide  (IMODIUM ) 2 MG capsule Take 1 capsule (2 mg total) by mouth See admin instructions. Take 2 tablets after first loose stool,  then 1 tablet  after each loose stool; maximum: 8 tablets /day 60 capsule 0   loratadine (CLARITIN) 10 MG tablet Take 10 mg by mouth daily.     megestrol  (MEGACE ) 40 MG tablet TAKE 2 TABLETS BY MOUTH 2 TIMES DAILY. 360 tablet 1    olmesartan (BENICAR) 20 MG tablet Take 1 tablet by mouth daily.     omeprazole  (PRILOSEC) 20 MG capsule Take 1 capsule (20 mg total) by mouth daily. 30 capsule 3   ondansetron  (ZOFRAN ) 8 MG tablet Take 1 tablet (8 mg  total) by mouth 2 (two) times daily as needed for refractory nausea / vomiting. Start on day 3 after chemotherapy. 60 tablet 1   potassium chloride  SA (KLOR-CON  M20) 20 MEQ tablet Take 1 tablet (20 mEq total) by mouth daily. 180 tablet 0   Probiotic Product (PROBIOTIC DAILY PO) Take 1 tablet by mouth daily.     prochlorperazine  (COMPAZINE ) 10 MG tablet Take 1 tablet (10 mg total) by mouth every 6 (six) hours as needed (NAUSEA). 30 tablet 1   senna-docusate (SENNA S) 8.6-50 MG tablet Take 2 tablets by mouth daily. 60 tablet 3   tamsulosin  (FLOMAX ) 0.4 MG CAPS capsule Take 1 capsule (0.4 mg total) by mouth daily after supper. 90 capsule 3   No current facility-administered medications for this visit.   Facility-Administered Medications Ordered in Other Visits  Medication Dose Route Frequency Provider Last Rate Last Admin   0.9 %  sodium chloride  infusion   Intravenous Once Corcoran, Melissa C, MD       0.9 %  sodium chloride  infusion   Intravenous Continuous Corcoran, Melissa C, MD 10 mL/hr at 12/31/19 1000 New Bag at 10/05/20 1045   heparin  lock flush 100 unit/mL  500 Units Intravenous Once Corcoran, Melissa C, MD       sodium chloride  flush (NS) 0.9 % injection 10 mL  10 mL Intracatheter PRN Timmy Forbes, MD   10 mL at 02/01/23 1341    Performance status (ECOG): 1  Vitals Blood pressure 95/64, pulse 80, temperature 97.8 F (36.6 C), temperature source Tympanic, resp. rate 17, weight 200 lb (90.7 kg), SpO2 100%.   Physical Exam Constitutional:      General: He is not in acute distress.    Appearance: He is obese. He is not diaphoretic.  HENT:     Head: Normocephalic and atraumatic.  Eyes:     General: No scleral icterus. Cardiovascular:     Rate and Rhythm: Normal rate and  regular rhythm.     Heart sounds: No murmur heard. Pulmonary:     Effort: Pulmonary effort is normal. No respiratory distress.     Breath sounds: No wheezing.     Comments: Decreased breath sound bilaterally.  Abdominal:     General: Bowel sounds are normal. There is no distension.     Palpations: Abdomen is soft.  Musculoskeletal:        General: Normal range of motion.     Cervical back: Normal range of motion and neck supple.     Comments: +1 edema bilateral lower extremities.   Skin:    General: Skin is warm and dry.     Findings: No erythema.  Neurological:     Mental Status: He is alert and oriented to person, place, and time. Mental status is at baseline.     Motor: No abnormal muscle tone.  Psychiatric:        Mood and Affect: Mood and affect normal.     Labs were reviewed by me.     Latest Ref Rng & Units 10/02/2023    8:31 AM 09/18/2023    8:07 AM 08/28/2023    8:21 AM  CBC  WBC 4.0 - 10.5 K/uL 4.6  4.2  4.0   Hemoglobin 13.0 - 17.0 g/dL 11.9  14.7  82.9   Hematocrit 39.0 - 52.0 % 31.9  32.7  32.2   Platelets 150 - 400 K/uL 158  184  174       Latest Ref Rng & Units 10/02/2023  8:31 AM 09/18/2023    8:07 AM 08/28/2023    8:21 AM  CMP  Glucose 70 - 99 mg/dL 161  096  045   BUN 8 - 23 mg/dL 15  11  13    Creatinine 0.61 - 1.24 mg/dL 4.09  8.11  9.14   Sodium 135 - 145 mmol/L 136  131  137   Potassium 3.5 - 5.1 mmol/L 3.9  3.9  4.0   Chloride 98 - 111 mmol/L 105  100  105   CO2 22 - 32 mmol/L 24  25  24    Calcium  8.9 - 10.3 mg/dL 8.6  8.6  9.0   Total Protein 6.5 - 8.1 g/dL 5.8  5.7  5.8   Total Bilirubin 0.0 - 1.2 mg/dL 1.0  0.8  0.8   Alkaline Phos 38 - 126 U/L 45  50  49   AST 15 - 41 U/L 26  31  28    ALT 0 - 44 U/L 14  17  15

## 2023-10-03 ENCOUNTER — Other Ambulatory Visit: Payer: Self-pay

## 2023-10-04 ENCOUNTER — Inpatient Hospital Stay

## 2023-10-16 ENCOUNTER — Encounter: Payer: Self-pay | Admitting: Oncology

## 2023-10-16 ENCOUNTER — Inpatient Hospital Stay

## 2023-10-16 ENCOUNTER — Other Ambulatory Visit: Payer: Self-pay

## 2023-10-16 ENCOUNTER — Inpatient Hospital Stay: Attending: Oncology

## 2023-10-16 ENCOUNTER — Inpatient Hospital Stay (HOSPITAL_BASED_OUTPATIENT_CLINIC_OR_DEPARTMENT_OTHER): Admitting: Oncology

## 2023-10-16 VITALS — BP 118/57 | HR 62 | Temp 97.6°F | Resp 18 | Wt 198.1 lb

## 2023-10-16 DIAGNOSIS — Z85038 Personal history of other malignant neoplasm of large intestine: Secondary | ICD-10-CM

## 2023-10-16 DIAGNOSIS — Z803 Family history of malignant neoplasm of breast: Secondary | ICD-10-CM | POA: Diagnosis not present

## 2023-10-16 DIAGNOSIS — Z923 Personal history of irradiation: Secondary | ICD-10-CM | POA: Insufficient documentation

## 2023-10-16 DIAGNOSIS — R5383 Other fatigue: Secondary | ICD-10-CM | POA: Diagnosis not present

## 2023-10-16 DIAGNOSIS — R978 Other abnormal tumor markers: Secondary | ICD-10-CM

## 2023-10-16 DIAGNOSIS — D6481 Anemia due to antineoplastic chemotherapy: Secondary | ICD-10-CM

## 2023-10-16 DIAGNOSIS — C184 Malignant neoplasm of transverse colon: Secondary | ICD-10-CM

## 2023-10-16 DIAGNOSIS — C786 Secondary malignant neoplasm of retroperitoneum and peritoneum: Secondary | ICD-10-CM

## 2023-10-16 DIAGNOSIS — R972 Elevated prostate specific antigen [PSA]: Secondary | ICD-10-CM | POA: Diagnosis not present

## 2023-10-16 DIAGNOSIS — Z5112 Encounter for antineoplastic immunotherapy: Secondary | ICD-10-CM | POA: Insufficient documentation

## 2023-10-16 DIAGNOSIS — Z452 Encounter for adjustment and management of vascular access device: Secondary | ICD-10-CM | POA: Diagnosis not present

## 2023-10-16 DIAGNOSIS — R918 Other nonspecific abnormal finding of lung field: Secondary | ICD-10-CM | POA: Insufficient documentation

## 2023-10-16 DIAGNOSIS — Z79899 Other long term (current) drug therapy: Secondary | ICD-10-CM | POA: Insufficient documentation

## 2023-10-16 DIAGNOSIS — Z7982 Long term (current) use of aspirin: Secondary | ICD-10-CM | POA: Insufficient documentation

## 2023-10-16 DIAGNOSIS — T451X5D Adverse effect of antineoplastic and immunosuppressive drugs, subsequent encounter: Secondary | ICD-10-CM | POA: Insufficient documentation

## 2023-10-16 DIAGNOSIS — Z5111 Encounter for antineoplastic chemotherapy: Secondary | ICD-10-CM | POA: Diagnosis present

## 2023-10-16 LAB — CBC WITH DIFFERENTIAL/PLATELET
Abs Immature Granulocytes: 0.03 10*3/uL (ref 0.00–0.07)
Basophils Absolute: 0 10*3/uL (ref 0.0–0.1)
Basophils Relative: 1 %
Eosinophils Absolute: 0.1 10*3/uL (ref 0.0–0.5)
Eosinophils Relative: 3 %
HCT: 31.2 % — ABNORMAL LOW (ref 39.0–52.0)
Hemoglobin: 10.4 g/dL — ABNORMAL LOW (ref 13.0–17.0)
Immature Granulocytes: 1 %
Lymphocytes Relative: 19 %
Lymphs Abs: 0.8 10*3/uL (ref 0.7–4.0)
MCH: 32.2 pg (ref 26.0–34.0)
MCHC: 33.3 g/dL (ref 30.0–36.0)
MCV: 96.6 fL (ref 80.0–100.0)
Monocytes Absolute: 0.3 10*3/uL (ref 0.1–1.0)
Monocytes Relative: 8 %
Neutro Abs: 2.8 10*3/uL (ref 1.7–7.7)
Neutrophils Relative %: 68 %
Platelets: 177 10*3/uL (ref 150–400)
RBC: 3.23 MIL/uL — ABNORMAL LOW (ref 4.22–5.81)
RDW: 13.1 % (ref 11.5–15.5)
WBC: 4.1 10*3/uL (ref 4.0–10.5)
nRBC: 0 % (ref 0.0–0.2)

## 2023-10-16 LAB — COMPREHENSIVE METABOLIC PANEL WITH GFR
ALT: 16 U/L (ref 0–44)
AST: 29 U/L (ref 15–41)
Albumin: 3.1 g/dL — ABNORMAL LOW (ref 3.5–5.0)
Alkaline Phosphatase: 47 U/L (ref 38–126)
Anion gap: 7 (ref 5–15)
BUN: 14 mg/dL (ref 8–23)
CO2: 24 mmol/L (ref 22–32)
Calcium: 8.7 mg/dL — ABNORMAL LOW (ref 8.9–10.3)
Chloride: 105 mmol/L (ref 98–111)
Creatinine, Ser: 0.55 mg/dL — ABNORMAL LOW (ref 0.61–1.24)
GFR, Estimated: >60 mL/min (ref >60)
Glucose, Bld: 179 mg/dL — ABNORMAL HIGH (ref 70–99)
Potassium: 3.7 mmol/L (ref 3.5–5.1)
Sodium: 136 mmol/L (ref 135–145)
Total Bilirubin: 0.9 mg/dL (ref 0.0–1.2)
Total Protein: 5.7 g/dL — ABNORMAL LOW (ref 6.5–8.1)

## 2023-10-16 LAB — TOTAL PROTEIN, URINE DIPSTICK: Protein, ur: 30 mg/dL — AB

## 2023-10-16 MED ORDER — SODIUM CHLORIDE 0.9 % IV SOLN
2400.0000 mg/m2 | INTRAVENOUS | Status: DC
  Administered 2023-10-16: 5000 mg via INTRAVENOUS
  Filled 2023-10-16: qty 100

## 2023-10-16 MED ORDER — SODIUM CHLORIDE 0.9 % IV SOLN
5.0000 mg/kg | Freq: Once | INTRAVENOUS | Status: AC
  Administered 2023-10-16: 500 mg via INTRAVENOUS
  Filled 2023-10-16: qty 16

## 2023-10-16 MED ORDER — SODIUM CHLORIDE 0.9 % IV SOLN
Freq: Once | INTRAVENOUS | Status: AC
  Filled 2023-10-16: qty 250

## 2023-10-16 MED ORDER — SODIUM CHLORIDE 0.9 % IV SOLN
400.0000 mg/m2 | Freq: Once | INTRAVENOUS | Status: AC
  Administered 2023-10-16: 860 mg via INTRAVENOUS
  Filled 2023-10-16: qty 43

## 2023-10-16 NOTE — Assessment & Plan Note (Addendum)
 Stable  small lung nodule Attention on follow up

## 2023-10-16 NOTE — Assessment & Plan Note (Addendum)
 CT images shows slight lung progression -Finished palliative radiation on 03/05/2023. March 2025 CT scan shows stable disease.  Labs are reviewed and discussed with patient. Proceed with 5-FU and Bevavizumab  Repeat CT scan

## 2023-10-16 NOTE — Assessment & Plan Note (Signed)
 Chemotherapy plan as listed above

## 2023-10-16 NOTE — Progress Notes (Signed)
 Hematology/Oncology Progress note [phone] [phone]    Chief Complaint: Jared Gutierrez is a 83 y.o.  male presents for follow-up of metastatic colon cancer   ASSESSMENT & PLAN:   Cancer Staging  Cancer of transverse colon [facility]) Staging form: Colon and Rectum, [name], [name], cM1) - Signed by Timmy Forbes, MD on 10/26/2021   Cancer of transverse colon [facility]) CT images shows slight lung progression -Finished palliative radiation on 03/05/2023. March 2025 CT scan shows stable disease.  Labs are reviewed and discussed with patient. Proceed with 5-FU and Bevavizumab  Repeat CT scan   Anemia due to chemotherapy Improved after holding chemotherapy Continue to monitor.  Encounter for antineoplastic chemotherapy Chemotherapy plan as listed above.   Lung nodules Stable  small lung nodule Attention on follow up     Follow-up 2 weeks  All questions were answered. The patient knows to call the clinic with any problems, questions or concerns.  Timmy Forbes, MD, PhD [facility] Hematology Oncology 10/16/2023     PERTINENT ONCOLOGY HISTORY Jared Gutierrez is a 83 y.o.[place] who has above oncology history reviewed by me today presented for follow up visit for management of Metastatic colon cancer. [name], patient switched care to me on 11/11/20 Extensive medical record review was performed by me  Oncology History  History of colon cancer, stage I  10/02/2014 Pathology Results   [id number] colonoscopy pathology A. CECAL POLYPS; HOT SNARE AND COLD BIOPSY:  - TUBULAR ADENOMA, MULTIPLE FRAGMENTS.  - NEGATIVE FOR HIGH-GRADE DYSPLASIA AND MALIGNANCY.   B. COLON POLYP, TRANSVERSE; COLD BIOPSY:  - TUBULAR ADENOMA.  - NEGATIVE FOR HIGH-GRADE DYSPLASIA AND MALIGNANCY.     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  Tumor extended into the submucosa. Margins were negative. + lymphovascular invasion. 14 lymph nodes were negative. Pathologic stage was T1 N0.  TRANSVERSE COLON, RESECTION:  - INVASIVE ADENOCARCINOMA ARISING IN A 4.6 CM TUBULOVILLOUS  ADENOMA WITH HIGH GRADE DYSPLASIA (MALIGNANT POLYP).  - SEE SUMMARY BELOW.  Jared Gutierrez  ONCOLOGY SUMMARY: COLON AND RECTUM, RESECTION, AJCC 7TH EDITION  Specimen: transverse colon  Procedure: transverse colon resection  Tumor site: splenic flexure  Tumor size: 1.0 cm (invasive component)  Macroscopic tumor perforation: not specified  Histologic type: invasive adenocarcinoma  Histologic grade: moderately differentiated  Microscopic tumor extension: into submucosa  Margins:       Proximal margin: negative       Distal margin: negative       Circumferential (radial) or mesenteric margin: negative       If all margins uninvolved by invasive carcinoma:       Distance of  invasive carcinoma from closest margin: 7.5 cm  to distal margin  Treatment effect: not applicable  Lymph-vascular invasion: present  Perineural invasion: not identified  Tumor deposits (discontinuous extramural extension): not  identified  Pathologic staging:       Primary tumor: pT1       Regional lymph  nodes: pN0            Number of nodes examined: 14            Number of nodes involved: 0    04/02/2019 Progression   04/02/2019  PET scan  limited evealed a 2.4 x 2.3 cm (SUV 11) hypermetabolic soft tissue density caudal and anterior to the pancreatic neck favored to represent isolated peritoneal or nodal metastasis in the setting of prior transverse colonic resection (expected primary drainage). Although this was immediately adjacent to the pancreas, a fat plane was maintained, arguing strongly against a pancreatic primary. Otherwise, there was no evidence of hypermetabolic metastasis. 04/17/2019 EUS on  revealed a normal esophagus, stomach, duodenum, and pancreas.  There was a 2.4 x 2.4 cm irregular mass in the retroperitoneum adjacent to, but not involving the pancreatic neck.  FNA and core needle biopsy were performed.  Pathology revealed adenocarcinoma compatible with a metastatic lesion of colorectal origin.  Tumor cells were positive for CK20 and CDX2 and negative for CK7.  NGS: [employer] on 05/15/2019 revealed + [id number] and TP53.  Negative results included BRAF V600E, Her2, NRAS, NTRK, PD-L1 (<1%), and TMB 8.7/Mb (intermediate).  MMR testing from his colon resection on 10/16/2013 was intact with a low probability of MSI-H.   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  He received Neulasta  after cycle #4 and #5 secondary to progressive leukopenia.  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  He received a truncated course of FOLFOX with cycle #11 secondary to oxaliplatin  reaction.   01/14/2020 Imaging    PET revealed Jared interval decrease in size and FDG uptake (2.4 x 2.3 cm with SUV 11 to 2.4 x 1.7 cm with SUV 4.09) associated with the previously referenced soft tissue density caudal and anterior to the pancreatic neck suggesting treatment  response. There were no new sites of FDG avid tumor.   08/31/2020 Imaging   08/31/2020 Abdomen and pelvis CT revealed increased size of the index soft tissue lesion inferior and anterior to the pancreatic neck (2.9 x 1.8 cm to 3.5 x 2.9 cm). There were similar prominent retroperitoneal lymph nodes without adenopathy by size criteria. There was no new or enlarging abdominal or pelvic lymph nodes. There were no new interval findings. There was similar circumferential wall thickening of a nondistended urinary bladder, which likely accentuated wall thickening There was hepatic steatosis and aortic atherosclerosis.   11/03/2020, PET showed recurrent peritoneal metastasis in the upper abdomen adjacent to the pancreas.  The lesion is 3.8 x 2.9 cm with SUV of 11.3. No evidence of metastatic peritoneal disease elsewhere in the abdomen pelvis.  No evidence of distant metastasis.   11/03/2020 Imaging   PET scan showed recurrent peritoneal metastasis near pancreas. Given that he has no other distant metastasis. Discussed with radiation oncology. Repeat SBRT may be considered if he does not respond well to systemic chemotherapy.     11/26/2020 -  Chemotherapy   5-FU and bevacizumab .   02/17/2021 Imaging   02/17/2021 CT  abdomen pelvis showed partial response. Mild decrease of peritoneal lesion.  Proceed with 5-FU and bevacizumab  today.  Given that he is tolerating current regimen with good life quality, partial response.  Shared decision was made to hold off adding additional chemotherapy agents.   06/08/2021, CT chest abdomen pelvis with contrast showed soft tissue mass at the base of mesentery inferior to the pancreatic neck is unchanged in size.  No progressive disease was identified.   09/07/2021, CT chest abdomen pelvis with contrast showed no substantial changes in size of the mesenteric mass, 2.6 cm.  No suspicious new lesions.  Chronic findings as detailed in the imaging report.   12/05/2021 Imaging   PET  scan showed soft tissue mesenteric mass is unchanged in size.  Decreased FDG uptake compared to activity on November 03, 2020.  Fever treated disease.  No new metastatic disease.   03/21/2022 Imaging   CT chest abdomen pelvis  1. Similar size of the peripancreatic presumed peritoneal implant compared to 09/07/2021. 2. No new or progressive disease. 3. Incidental findings, including: Coronary artery atherosclerosis.Aortic Atherosclerosis (ICD10-I70.0). Prostatomegaly with chronic bladder wall thickening, suggesting outlet obstruction.     07/11/2022 Imaging   CT chest abdomen pelvis with contrast showed 1. New 6 mm nodule in the right upper lobe and 2 mm nodule in the left upper lobe. Although small these are concerning. Recommend short follow-up in 3 months 2. Stable low-attenuation lesion along the midbody of the pancreas. Continued attention on follow-up. No new peritoneal mass lesions or ascites. 3. Fatty liver infiltration 4. Colonic surgical changes   10/03/2022 Imaging   CT chest abdomen pelvis w contrast showed 1. Slight interval enlargement and cavitation of a nodule in the right upper lobe, measuring 0.6 cm, previously 0.5 cm. Slight interval enlargement of a left upper lobe nodule measuring 0.3 cm,previously 0.2 cm. Findings are concerning for slowly enlarging small pulmonary metastases. 2. No significant change in a lobulated soft tissue mass within the central small bowel mesentery abutting the inferior aspect of the pancreas, consistent with a stable, treated metastasis. 3. No other evidence of lymphadenopathy or metastatic disease in the chest, abdomen, or pelvis. 4. Prostatomegaly with diffuse wall thickening of the relatively decompressed urinary bladder, likely secondary to chronic outlet obstruction. 5. Unchanged fibrotic scarring and bronchiectasis of the bilateral lung bases. This could be further assessed by pulmonary referral and dedicated interstitial lung disease protocol CT  examination of the chest if and when clinically appropriate in the setting of known metastatic malignancy. Aortic Atherosclerosis    12/27/2022 Imaging   CT chest abdomen pelvis w contrast showed 1. Continued slight interval enlargement of a cavitary nodule in the right upper lobe measuring 0.7 x 0.6 cm, as well as a nodule in the peripheral left upper lobe measuring 0.5 cm. These are highly suspicious for slowly enlarging pulmonary metastases but remain below size threshold for reliable PET-CT characterization. 2. Unchanged, lobulated hypodense soft tissue mass in the central small bowel mesentery, consistent with treated metastatic disease. 3. Status post transverse colon resection and reanastomosis. 4. Thickening of the distal sigmoid colon and rectum, consistent with nonspecific infectious or inflammatory colitis. 5. Prostatomegaly with diffuse thickening of the urinary bladder wall, likely secondary to chronic outlet obstruction.   Aortic Atherosclerosis (ICD10-I70.0).   01/09/2023 Imaging   PET scan showed Status post transverse colectomy. 4.2 cm soft tissue mass in the central abdominal mesentery, suspicious for residual/recurrent nodal metastasis. Additional 8 mm short axis left para-aortic nodal metastasis. 7 mm  cavitary right upper lobe nodule and 5 mm left upper lobe nodule, both beneath the size threshold for PET sensitivity, but suspicious for small pulmonary metastases.   01/30/2023 -  Radiation Therapy   Palliative radiation to mesenteric soft tissue mass.    05/07/2023 Imaging   CT chest abdomen pelvis w contrast showed  Focal nodular area along the inferior margin of the midbody of the pancreas similar to previous. There is also some abnormal soft tissue extending into the porta hepatis encasing the hepatic artery which compared to previous is also slightly increased.   New small soft tissue nodule adjacent to the SMA proximally.   Previous hypermetabolic node left  para-aortic is similar in size to previous.   Stable bilateral small lung nodules. Right lesions again cavitary. No new lung nodules.   Stable surgical changes.  Chest port.     Imaging     08/06/2023 Imaging   CT chest abdomen pelvis w contrast showed Small nodules are again seen. These appears slightly smaller overall from previous. No new lung nodule identified at this time.   Lesion along the inferior margin of the midbody of the pancreas similar to the prior examination. Persistent ill-defined soft tissue extending along the porta hepatis encasing the course of the [facility] artery. Small lymph nodes elsewhere in the upper retroperitoneum are stable.      Other medical problems Chronic lower extremity edema. 12/24/2018, right lower extremity duplex negative for DVT.  Small right Baker's cyst. 08/16/2019, bilateral lower extremity duplex showed no DVT. 08/16/2019 - 08/17/2019 with right lower extremity cellulitis.  He was unable to bear weight.  He was treated with IV fluids, NSAIDs, colchicine , and broad antibiotics (vancomycin  and Cefepime ).  He was discharged on indomethacin  x 5 days and Keflex  500 mg TID x 5 days.  10/05/2020, colonoscopy showed internal hemorrhoids.  Otherwise normal examination.  INTERVAL HISTORY Jared Gutierrez is a 83 y.o. male who has above history reviewed by me today presents for follow up visit for management of recurrent metastatic colon cancer Appetite is fair, lost 2 pounds.  No nausea vomiting diarrhea, fever chills.   Review of Systems  Constitutional:  Positive for fatigue. Negative for appetite change, chills, diaphoresis, fever and unexpected weight change.  HENT:   Negative for hearing loss, nosebleeds, sore throat and tinnitus.   Respiratory:  Negative for cough, hemoptysis and shortness of breath.   Cardiovascular:  Negative for chest pain and palpitations.  Gastrointestinal:  Negative for abdominal pain, blood in stool, constipation,  diarrhea, nausea and vomiting.  Genitourinary:  Negative for dysuria, frequency and hematuria.   Musculoskeletal:  Positive for arthralgias. Negative for back pain, myalgias and neck pain.  Skin:  Negative for itching and rash.  Neurological:  Positive for numbness. Negative for dizziness and headaches.  Hematological:  Does not bruise/bleed easily.  Psychiatric/Behavioral:  Negative for depression. The patient is not nervous/anxious.       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   Chicken pox    Colon cancer (HCC)    History of kidney stones    Hypertension    Lipoma of colon    Nephrolithiasis    Nephrolithiasis    Obesity    Shingles    Tubular adenoma of colon    multiple fragments    Past Surgical History:  Procedure Laterality Date  COLON SURGERY     COLONOSCOPY N/A 10/02/2014   Procedure: COLONOSCOPY;  Surgeon: Luella Sager, MD;  Location: [facility] ENDOSCOPY;  Service: Endoscopy;  Laterality: N/A;   COLONOSCOPY WITH PROPOFOL  N/A 02/16/2017   Procedure: COLONOSCOPY WITH PROPOFOL ;  Surgeon: Cassie Click, MD;  Location: [facility] ENDOSCOPY;  Service: Endoscopy;  Laterality: N/A;   COLONOSCOPY WITH PROPOFOL  N/A 10/05/2020   Procedure: COLONOSCOPY WITH PROPOFOL ;  Surgeon: Shane Darling, MD;  Location: [facility] ENDOSCOPY;  Service: Endoscopy;  Laterality: N/A;   EUS N/A 04/17/2019   Procedure: FULL UPPER ENDOSCOPIC ULTRASOUND (EUS) RADIAL;  Surgeon: Eloisa Hait, MD;  Location[facility] ENDOSCOPY;  Service: Gastroenterology;  Laterality: N/A;   KIDNEY STONE SURGERY     PARTIAL COLECTOMY  10/17/2013   PORTACATH PLACEMENT Right 06/13/2019   Procedure: INSERTION PORT-A-CATH;  Surgeon: Conrado Delay, DO;  Location: [facility] ORS;  Service: General;  Laterality: Right;    Family History  Problem Relation Age of Onset   Cancer Mother    Breast cancer Mother    COPD Father      Social History:  reports that he has never smoked. He has never used smokeless tobacco. He reports current alcohol use. He reports that he does not use drugs.    Allergies: No Known Allergies  Current Medications: Current Outpatient Medications  Medication Sig Dispense Refill   acetaminophen  (TYLENOL ) 500 MG tablet Take 500 mg by mouth every 6 (six) hours as needed.     amLODipine (NORVASC) 2.5 MG tablet Take 2.5 mg by mouth daily.     aspirin EC 81 MG tablet Take 81 mg by mouth daily.     Cholecalciferol (VITAMIN D) 125 MCG (5000 UT) CAPS Take 5,000 mg by mouth.     cyanocobalamin (VITAMIN B12) 500 MCG tablet Take by mouth.     docusate sodium  (COLACE) 100 MG capsule Take 1 capsule (100 mg total) by mouth 2 (two) times daily as needed for mild constipation or moderate constipation. 60 capsule 3   Ferrous Fumarate (HEMOCYTE - 106 MG FE) 324 (106 Fe) MG TABS tablet Take 1 tablet by mouth.     furosemide (LASIX) 20 MG tablet Take 20 mg by mouth as needed. Prn for swelling     HYDROcodone -acetaminophen  (NORCO/VICODIN) 5-325 MG tablet Take 1 tablet by mouth every 6 (six) hours as needed for moderate pain (pain score 4-6) (up to 3 doses for moderate pain.).     lidocaine -prilocaine  (EMLA ) cream Apply small amount to port and cover with saran wrap 1-2 hours prior to port access 30 g 3   loperamide  (IMODIUM ) 2 MG capsule Take 1 capsule (2 mg total) by mouth See admin instructions. Take 2 tablets after first loose stool,  then 1 tablet  after each loose stool; maximum: 8 tablets /day 60 capsule 0   loratadine (CLARITIN) 10 MG tablet Take 10 mg by mouth daily.     megestrol  (MEGACE ) 40 MG tablet TAKE 2 TABLETS BY MOUTH 2 TIMES DAILY. 360 tablet 1   olmesartan (BENICAR) 20 MG tablet Take 1 tablet by mouth daily.     omeprazole  (PRILOSEC) 20 MG capsule Take 1 capsule (20 mg total) by mouth daily. 30 capsule 3   ondansetron  (ZOFRAN ) 8 MG tablet Take 1 tablet (8 mg total) by mouth 2 (two) times daily  as needed for refractory nausea / vomiting. Start on day 3 after chemotherapy. 60 tablet 1   potassium chloride  SA (KLOR-CON  M20) 20 MEQ tablet Take 1 tablet (20  mEq total) by mouth daily. 180 tablet 0   Probiotic Product (PROBIOTIC DAILY PO) Take 1 tablet by mouth daily.     prochlorperazine  (COMPAZINE ) 10 MG tablet Take 1 tablet (10 mg total) by mouth every 6 (six) hours as needed (NAUSEA). 30 tablet 1   senna-docusate (SENNA S) 8.6-50 MG tablet Take 2 tablets by mouth daily. 60 tablet 3   tamsulosin  (FLOMAX ) 0.4 MG CAPS capsule Take 1 capsule (0.4 mg total) by mouth daily after supper. 90 capsule 3   No current facility-administered medications for this visit.   Facility-Administered Medications Ordered in Other Visits  Medication Dose Route Frequency Provider Last Rate Last Admin   0.9 %  sodium chloride  infusion   Intravenous Once Corcoran, Melissa C, MD       0.9 %  sodium chloride  infusion   Intravenous Continuous Corcoran, Melissa C, MD 10 mL/hr at 12/31/19 1000 New Bag at 10/05/20 1045   heparin  lock flush 100 unit/mL  500 Units Intravenous Once Corcoran, Melissa C, MD       sodium chloride  flush (NS) 0.9 % injection 10 mL  10 mL Intracatheter PRN Timmy Forbes, MD   10 mL at 02/01/23 1341    Performance status (ECOG): 1  Vitals Blood pressure (!) 118/57, pulse 62, temperature 97.6 F (36.4 C), resp. rate 18, weight 198 lb 1.6 oz (89.9 kg), SpO2 100%.   Physical Exam Constitutional:      General: He is not in acute distress.    Appearance: He is obese. He is not diaphoretic.  HENT:     Head: Normocephalic and atraumatic.  Eyes:     General: No scleral icterus. Cardiovascular:     Rate and Rhythm: Normal rate and regular rhythm.     Heart sounds: No murmur heard. Pulmonary:     Effort: Pulmonary effort is normal. No respiratory distress.     Breath sounds: No wheezing.     Comments: Decreased breath sound bilaterally.  Abdominal:     General: Bowel sounds are normal. There  is no distension.     Palpations: Abdomen is soft.  Musculoskeletal:        General: Normal range of motion.     Cervical back: Normal range of motion and neck supple.     Comments: +1 edema bilateral lower extremities.   Skin:    General: Skin is warm and dry.     Findings: No erythema.  Neurological:     Mental Status: He is alert and oriented to person, place, and time. Mental status is at baseline.     Motor: No abnormal muscle tone.  Psychiatric:        Mood and Affect: Mood and affect normal.     Labs were reviewed by me.     Latest Ref Rng & Units 10/16/2023    8:22 AM 10/02/2023    8:31 AM 09/18/2023    8:07 AM  CBC  WBC 4.0 - 10.5 K/uL 4.1  4.6  4.2   Hemoglobin 13.0 - 17.0 g/dL 96.0  45.4  09.8   Hematocrit 39.0 - 52.0 % 31.2  31.9  32.7   Platelets 150 - 400 K/uL 177  158  184       Latest Ref Rng & Units 10/16/2023    8:22 AM 10/02/2023    8:31 AM 09/18/2023    8:07 AM  CMP  Glucose 70 - 99 mg/dL 119  147  829   BUN 8 - 23 mg/dL  14  15  11    Creatinine 0.61 - 1.24 mg/dL 1.61  0.96  0.45   Sodium 135 - 145 mmol/L 136  136  131   Potassium 3.5 - 5.1 mmol/L 3.7  3.9  3.9   Chloride 98 - 111 mmol/L 105  105  100   CO2 22 - 32 mmol/L 24  24  25    Calcium  8.9 - 10.3 mg/dL 8.7  8.6  8.6   Total Protein 6.5 - 8.1 g/dL 5.7  5.8  5.7   Total Bilirubin 0.0 - 1.2 mg/dL 0.9  1.0  0.8   Alkaline Phos 38 - 126 U/L 47  45  50   AST 15 - 41 U/L 29  26  31    ALT 0 - 44 U/L 16  14  17

## 2023-10-16 NOTE — Patient Instructions (Signed)
 [facility] - A DEPT OF [facility]  Discharge Instructions: Thank you for choosing [facility] to provide your oncology and hematology care.  If you have a lab appointment with the Cancer Center, please go directly to the Cancer Center and check in at the registration area.  Wear comfortable clothing and clothing appropriate for easy access to any Portacath or PICC line.   We strive to give you quality time with your provider. You may need to reschedule your appointment if you arrive late (15 or more minutes).  Arriving late affects you and other patients whose appointments are after yours.  Also, if you miss three or more appointments without notifying the office, you may be dismissed from the clinic at the provider's discretion.      For prescription refill requests, have your pharmacy contact our office and allow 72 hours for refills to be completed.    Today you received the following chemotherapy and/or immunotherapy agents VEGZELMA , LEUCOVORIN , 5 FU      To help prevent nausea and vomiting after your treatment, we encourage you to take your nausea medication as directed.  BELOW ARE SYMPTOMS THAT SHOULD BE REPORTED IMMEDIATELY: *FEVER GREATER THAN 100.4 F (38 C) OR HIGHER *CHILLS OR SWEATING *NAUSEA AND VOMITING THAT IS NOT CONTROLLED WITH YOUR NAUSEA MEDICATION *UNUSUAL SHORTNESS OF BREATH *UNUSUAL BRUISING OR BLEEDING *URINARY PROBLEMS (pain or burning when urinating, or frequent urination) *BOWEL PROBLEMS (unusual diarrhea, constipation, pain near the anus) TENDERNESS IN MOUTH AND THROAT WITH OR WITHOUT PRESENCE OF ULCERS (sore throat, sores in mouth, or a toothache) UNUSUAL RASH, SWELLING OR PAIN  UNUSUAL VAGINAL DISCHARGE OR ITCHING   Items with * indicate a potential emergency and should be followed up as soon as possible or go to the Emergency Department if any problems should occur.  Please show the CHEMOTHERAPY ALERT CARD or  IMMUNOTHERAPY ALERT CARD at check-in to the Emergency Department and triage nurse.  Should you have questions after your visit or need to cancel or reschedule your appointment, please contact [facility] - A DEPT OF [facility]  [phone] and follow the prompts.  Office hours are 8:00 a.m. to 4:30 p.m. Monday - Friday. Please note that voicemails left after 4:00 p.m. may not be returned until the following business day.  We are closed weekends and major holidays. You have access to a nurse at all times for urgent questions. Please call the main number to the clinic [phone] and follow the prompts.  For any non-urgent questions, you may also contact your provider using MyChart. We now offer e-Visits for anyone 44 and older to request care online for non-urgent symptoms. For details visit [email or web address].   Also download the MyChart app! Go to the app store, search "MyChart", open the app, select [facility], and log in with your MyChart username and password.  Bevacizumab  Injection What is this medication? BEVACIZUMAB  (be va SIZ yoo mab) treats some types of cancer. It works by blocking a protein that causes cancer cells to grow and multiply. This helps to slow or stop the spread of cancer cells. It is a monoclonal antibody. This medicine may be used for other purposes; ask your health care provider or pharmacist if you have questions. COMMON BRAND NAME(S): Alymsys , Avastin , MVASI , Vegzalma, Zirabev  What should I tell my care team before I take this medication? They need to know if you have any of  these conditions: Blood clots Coughing up blood Having or recent surgery Heart failure High blood pressure History of a connection between 2 or more body parts that do not usually connect (fistula) History of a tear in your stomach or intestines Protein in your urine An unusual or allergic reaction to bevacizumab , other medications, foods, dyes, or  preservatives Pregnant or trying to get pregnant Breast-feeding How should I use this medication? This medication is injected into a vein. It is given by your care team in a hospital or clinic setting. Talk to your care team the use of this medication in children. Special care may be needed. Overdosage: If you think you have taken too much of this medicine contact a poison control center or emergency room at once. NOTE: This medicine is only for you. Do not share this medicine with others. What if I miss a dose? Keep appointments for follow-up doses. It is important not to miss your dose. Call your care team if you are unable to keep an appointment. What may interact with this medication? Interactions are not expected. This list may not describe all possible interactions. Give your health care provider a list of all the medicines, herbs, non-prescription drugs, or dietary supplements you use. Also tell them if you smoke, drink alcohol, or use illegal drugs. Some items may interact with your medicine. What should I watch for while using this medication? Your condition will be monitored carefully while you are receiving this medication. You may need blood work while taking this medication. This medication may make you feel generally unwell. This is not uncommon as chemotherapy can affect healthy cells as well as cancer cells. Report any side effects. Continue your course of treatment even though you feel ill unless your care team tells you to stop. This medication may increase your risk to bruise or bleed. Call your care team if you notice any unusual bleeding. Before having surgery, talk to your care team to make sure it is ok. This medication can increase the risk of poor healing of your surgical site or wound. You will need to stop this medication for 28 days before surgery. After surgery, wait at least 28 days before restarting this medication. Make sure the surgical site or wound is healed enough  before restarting this medication. Talk to your care team if questions. Talk to your care team if you may be pregnant. Serious birth defects can occur if you take this medication during pregnancy and for 6 months after the last dose. Contraception is recommended while taking this medication and for 6 months after the last dose. Your care team can help you find the option that works for you. Do not breastfeed while taking this medication and for 6 months after the last dose. This medication can cause infertility. Talk to your care team if you are concerned about your fertility. What side effects may I notice from receiving this medication? Side effects that you should report to your care team as soon as possible: Allergic reactions--skin rash, itching, hives, swelling of the face, lips, tongue, or throat Bleeding--bloody or black, tar-like stools, vomiting blood or brown material that looks like coffee grounds, red or dark brown urine, small red or purple spots on skin, unusual bruising or bleeding Blood clot--pain, swelling, or warmth in the leg, shortness of breath, chest pain Heart attack--pain or tightness in the chest, shoulders, arms, or jaw, nausea, shortness of breath, cold or clammy skin, feeling faint or lightheaded Heart failure--shortness of breath, swelling  of the ankles, feet, or hands, sudden weight gain, unusual weakness or fatigue Increase in blood pressure Infection--fever, chills, cough, sore throat, wounds that don't heal, pain or trouble when passing urine, general feeling of discomfort or being unwell Infusion reactions--chest pain, shortness of breath or trouble breathing, feeling faint or lightheaded Kidney injury--decrease in the amount of urine, swelling of the ankles, hands, or feet Stomach pain that is severe, does not go away, or gets worse Stroke--sudden numbness or weakness of the face, arm, or leg, trouble speaking, confusion, trouble walking, loss of balance or  coordination, dizziness, severe headache, change in vision Sudden and severe headache, confusion, change in vision, seizures, which may be signs of posterior reversible encephalopathy syndrome (PRES) Side effects that usually do not require medical attention (report to your care team if they continue or are bothersome): Back pain Change in taste Diarrhea Dry skin Increased tears Nosebleed This list may not describe all possible side effects. Call your doctor for medical advice about side effects. You may report side effects to FDA at 1-800-FDA-1088. Where should I keep my medication? This medication is given in a hospital or clinic. It will not be stored at home. NOTE: This sheet is a summary. It may not cover all possible information. If you have questions about this medicine, talk to your doctor, pharmacist, or health care provider.  2024 Elsevier/Gold Standard (2021-09-16 00:00:00)  Leucovorin  Injection What is this medication? LEUCOVORIN  (loo koe VOR in) prevents side effects from certain medications, such as methotrexate. It works by increasing folate levels. This helps protect healthy cells in your body. It may also be used to treat anemia caused by low levels of folate. It can also be used with fluorouracil , a type of chemotherapy, to treat colorectal cancer. It works by increasing the effects of fluorouracil  in the body. This medicine may be used for other purposes; ask your health care provider or pharmacist if you have questions. What should I tell my care team before I take this medication? They need to know if you have any of these conditions: Anemia from low levels of vitamin B12 in the blood An unusual or allergic reaction to leucovorin , folic acid, other medications, foods, dyes, or preservatives Pregnant or trying to get pregnant Breastfeeding How should I use this medication? This medication is injected into a vein or a muscle. It is given by your care team in a hospital  or clinic setting. Talk to your care team about the use of this medication in children. Special care may be needed. Overdosage: If you think you have taken too much of this medicine contact a poison control center or emergency room at once. NOTE: This medicine is only for you. Do not share this medicine with others. What if I miss a dose? Keep appointments for follow-up doses. It is important not to miss your dose. Call your care team if you are unable to keep an appointment. What may interact with this medication? Capecitabine Fluorouracil  Phenobarbital Phenytoin Primidone Trimethoprim ;sulfamethoxazole  This list may not describe all possible interactions. Give your health care provider a list of all the medicines, herbs, non-prescription drugs, or dietary supplements you use. Also tell them if you smoke, drink alcohol, or use illegal drugs. Some items may interact with your medicine. What should I watch for while using this medication? Your condition will be monitored carefully while you are receiving this medication. This medication may increase the side effects of 5-fluorouracil . Tell your care team if you have diarrhea or  mouth sores that do not get better or that get worse. What side effects may I notice from receiving this medication? Side effects that you should report to your care team as soon as possible: Allergic reactions--skin rash, itching, hives, swelling of the face, lips, tongue, or throat This list may not describe all possible side effects. Call your doctor for medical advice about side effects. You may report side effects to FDA at 1-800-FDA-1088. Where should I keep my medication? This medication is given in a hospital or clinic. It will not be stored at home. NOTE: This sheet is a summary. It may not cover all possible information. If you have questions about this medicine, talk to your doctor, pharmacist, or health care provider.  2024 Elsevier/Gold Standard (2021-10-04  00:00:00)  Fluorouracil  Injection What is this medication? FLUOROURACIL  (flure oh YOOR a sil) treats some types of cancer. It works by slowing down the growth of cancer cells. This medicine may be used for other purposes; ask your health care provider or pharmacist if you have questions. COMMON BRAND NAME(S): Adrucil  What should I tell my care team before I take this medication? They need to know if you have any of these conditions: Blood disorders Dihydropyrimidine dehydrogenase (DPD) deficiency Infection, such as chickenpox, cold sores, herpes Kidney disease Liver disease Poor nutrition Recent or ongoing radiation therapy An unusual or allergic reaction to fluorouracil , other medications, foods, dyes, or preservatives If you or your partner are pregnant or trying to get pregnant Breast-feeding How should I use this medication? This medication is injected into a vein. It is administered by your care team in a hospital or clinic setting. Talk to your care team about the use of this medication in children. Special care may be needed. Overdosage: If you think you have taken too much of this medicine contact a poison control center or emergency room at once. NOTE: This medicine is only for you. Do not share this medicine with others. What if I miss a dose? Keep appointments for follow-up doses. It is important not to miss your dose. Call your care team if you are unable to keep an appointment. What may interact with this medication? Do not take this medication with any of the following: Live virus vaccines This medication may also interact with the following: Medications that treat or prevent blood clots, such as warfarin, enoxaparin , dalteparin This list may not describe all possible interactions. Give your health care provider a list of all the medicines, herbs, non-prescription drugs, or dietary supplements you use. Also tell them if you smoke, drink alcohol, or use illegal drugs. Some  items may interact with your medicine. What should I watch for while using this medication? Your condition will be monitored carefully while you are receiving this medication. This medication may make you feel generally unwell. This is not uncommon as chemotherapy can affect healthy cells as well as cancer cells. Report any side effects. Continue your course of treatment even though you feel ill unless your care team tells you to stop. In some cases, you may be given additional medications to help with side effects. Follow all directions for their use. This medication may increase your risk of getting an infection. Call your care team for advice if you get a fever, chills, sore throat, or other symptoms of a cold or flu. Do not treat yourself. Try to avoid being around people who are sick. This medication may increase your risk to bruise or bleed. Call your care team if  you notice any unusual bleeding. Be careful brushing or flossing your teeth or using a toothpick because you may get an infection or bleed more easily. If you have any dental work done, tell your dentist you are receiving this medication. Avoid taking medications that contain aspirin, acetaminophen , ibuprofen , naproxen, or ketoprofen unless instructed by your care team. These medications may hide a fever. Do not treat diarrhea with over the counter products. Contact your care team if you have diarrhea that lasts more than 2 days or if it is severe and watery. This medication can make you more sensitive to the sun. Keep out of the sun. If you cannot avoid being in the sun, wear protective clothing and sunscreen. Do not use sun lamps, tanning beds, or tanning booths. Talk to your care team if you or your partner wish to become pregnant or think you might be pregnant. This medication can cause serious birth defects if taken during pregnancy and for 3 months after the last dose. A reliable form of contraception is recommended while taking this  medication and for 3 months after the last dose. Talk to your care team about effective forms of contraception. Do not father a child while taking this medication and for 3 months after the last dose. Use a condom while having sex during this time period. Do not breastfeed while taking this medication. This medication may cause infertility. Talk to your care team if you are concerned about your fertility. What side effects may I notice from receiving this medication? Side effects that you should report to your care team as soon as possible: Allergic reactions--skin rash, itching, hives, swelling of the face, lips, tongue, or throat Heart attack--pain or tightness in the chest, shoulders, arms, or jaw, nausea, shortness of breath, cold or clammy skin, feeling faint or lightheaded Heart failure--shortness of breath, swelling of the ankles, feet, or hands, sudden weight gain, unusual weakness or fatigue Heart rhythm changes--fast or irregular heartbeat, dizziness, feeling faint or lightheaded, chest pain, trouble breathing High ammonia level--unusual weakness or fatigue, confusion, loss of appetite, nausea, vomiting, seizures Infection--fever, chills, cough, sore throat, wounds that don't heal, pain or trouble when passing urine, general feeling of discomfort or being unwell Low red blood cell level--unusual weakness or fatigue, dizziness, headache, trouble breathing Pain, tingling, or numbness in the hands or feet, muscle weakness, change in vision, confusion or trouble speaking, loss of balance or coordination, trouble walking, seizures Redness, swelling, and blistering of the skin over hands and feet Severe or prolonged diarrhea Unusual bruising or bleeding Side effects that usually do not require medical attention (report to your care team if they continue or are bothersome): Dry skin Headache Increased tears Nausea Pain, redness, or swelling with sores inside the mouth or throat Sensitivity  to light Vomiting This list may not describe all possible side effects. Call your doctor for medical advice about side effects. You may report side effects to FDA at 1-800-FDA-1088. Where should I keep my medication? This medication is given in a hospital or clinic. It will not be stored at home. NOTE: This sheet is a summary. It may not cover all possible information. If you have questions about this medicine, talk to your doctor, pharmacist, or health care provider.  2024 Elsevier/Gold Standard (2021-09-06 00:00:00)

## 2023-10-16 NOTE — Assessment & Plan Note (Addendum)
 Improved after holding chemotherapy Continue to monitor.

## 2023-10-17 ENCOUNTER — Other Ambulatory Visit: Payer: Self-pay

## 2023-10-17 LAB — CANCER ANTIGEN 19-9: CA 19-9: 45 U/mL — ABNORMAL HIGH (ref 0–35)

## 2023-10-18 ENCOUNTER — Inpatient Hospital Stay

## 2023-10-18 ENCOUNTER — Encounter

## 2023-10-18 VITALS — BP 101/59 | HR 69 | Temp 96.0°F | Resp 19

## 2023-10-18 DIAGNOSIS — C786 Secondary malignant neoplasm of retroperitoneum and peritoneum: Secondary | ICD-10-CM

## 2023-10-18 DIAGNOSIS — Z5112 Encounter for antineoplastic immunotherapy: Secondary | ICD-10-CM | POA: Diagnosis not present

## 2023-10-18 DIAGNOSIS — Z85038 Personal history of other malignant neoplasm of large intestine: Secondary | ICD-10-CM

## 2023-10-18 MED ORDER — HEPARIN SOD (PORK) LOCK FLUSH 100 UNIT/ML IV SOLN
500.0000 [IU] | Freq: Once | INTRAVENOUS | Status: AC | PRN
  Administered 2023-10-18: 500 [IU]
  Filled 2023-10-18: qty 5

## 2023-10-18 MED ORDER — SODIUM CHLORIDE 0.9% FLUSH
10.0000 mL | INTRAVENOUS | Status: DC | PRN
  Administered 2023-10-18: 10 mL
  Filled 2023-10-18: qty 10

## 2023-10-30 ENCOUNTER — Other Ambulatory Visit

## 2023-10-30 ENCOUNTER — Ambulatory Visit

## 2023-10-30 ENCOUNTER — Ambulatory Visit: Admitting: Oncology

## 2023-10-30 ENCOUNTER — Ambulatory Visit
Admission: RE | Admit: 2023-10-30 | Discharge: 2023-10-30 | Disposition: A | Discharge status: Home / Self Care | Source: Ambulatory Visit | Attending: Oncology | Admitting: Oncology

## 2023-10-30 DIAGNOSIS — Z85038 Personal history of other malignant neoplasm of large intestine: Secondary | ICD-10-CM | POA: Diagnosis present

## 2023-10-30 MED ORDER — IOHEXOL 300 MG/ML  SOLN
100.0000 mL | Freq: Once | INTRAMUSCULAR | Status: AC | PRN
  Administered 2023-10-30: 100 mL via INTRAVENOUS

## 2023-10-30 MED ORDER — BARIUM SULFATE 2 % PO SUSP
450.0000 mL | ORAL | Status: AC
  Administered 2023-10-30 (×2): 450 mL via ORAL

## 2023-11-01 ENCOUNTER — Encounter

## 2023-11-06 ENCOUNTER — Inpatient Hospital Stay (HOSPITAL_BASED_OUTPATIENT_CLINIC_OR_DEPARTMENT_OTHER): Admitting: Oncology

## 2023-11-06 ENCOUNTER — Inpatient Hospital Stay

## 2023-11-06 ENCOUNTER — Encounter: Payer: Self-pay | Admitting: Oncology

## 2023-11-06 VITALS — BP 112/63 | HR 65 | Temp 97.5°F | Resp 18 | Wt 199.4 lb

## 2023-11-06 DIAGNOSIS — R918 Other nonspecific abnormal finding of lung field: Secondary | ICD-10-CM

## 2023-11-06 DIAGNOSIS — T451X5A Adverse effect of antineoplastic and immunosuppressive drugs, initial encounter: Secondary | ICD-10-CM

## 2023-11-06 DIAGNOSIS — Z85038 Personal history of other malignant neoplasm of large intestine: Secondary | ICD-10-CM

## 2023-11-06 DIAGNOSIS — C184 Malignant neoplasm of transverse colon: Secondary | ICD-10-CM

## 2023-11-06 DIAGNOSIS — Z5111 Encounter for antineoplastic chemotherapy: Secondary | ICD-10-CM

## 2023-11-06 DIAGNOSIS — R978 Other abnormal tumor markers: Secondary | ICD-10-CM

## 2023-11-06 DIAGNOSIS — R972 Elevated prostate specific antigen [PSA]: Secondary | ICD-10-CM | POA: Diagnosis not present

## 2023-11-06 DIAGNOSIS — C786 Secondary malignant neoplasm of retroperitoneum and peritoneum: Secondary | ICD-10-CM

## 2023-11-06 DIAGNOSIS — Z5112 Encounter for antineoplastic immunotherapy: Secondary | ICD-10-CM | POA: Diagnosis not present

## 2023-11-06 DIAGNOSIS — D6481 Anemia due to antineoplastic chemotherapy: Secondary | ICD-10-CM

## 2023-11-06 LAB — COMPREHENSIVE METABOLIC PANEL WITH GFR
ALT: 15 U/L (ref 0–44)
AST: 23 U/L (ref 15–41)
Albumin: 3 g/dL — ABNORMAL LOW (ref 3.5–5.0)
Alkaline Phosphatase: 42 U/L (ref 38–126)
Anion gap: 5 (ref 5–15)
BUN: 13 mg/dL (ref 8–23)
CO2: 20 mmol/L — ABNORMAL LOW (ref 22–32)
Calcium: 8.6 mg/dL — ABNORMAL LOW (ref 8.9–10.3)
Chloride: 109 mmol/L (ref 98–111)
Creatinine, Ser: 0.56 mg/dL — ABNORMAL LOW (ref 0.61–1.24)
GFR, Estimated: >60 mL/min (ref >60)
Glucose, Bld: 150 mg/dL — ABNORMAL HIGH (ref 70–99)
Potassium: 3.5 mmol/L (ref 3.5–5.1)
Sodium: 134 mmol/L — ABNORMAL LOW (ref 135–145)
Total Bilirubin: 0.8 mg/dL (ref 0.0–1.2)
Total Protein: 5.3 g/dL — ABNORMAL LOW (ref 6.5–8.1)

## 2023-11-06 LAB — CBC WITH DIFFERENTIAL/PLATELET
Abs Immature Granulocytes: 0.02 10*3/uL (ref 0.00–0.07)
Basophils Absolute: 0 10*3/uL (ref 0.0–0.1)
Basophils Relative: 1 %
Eosinophils Absolute: 0.1 10*3/uL (ref 0.0–0.5)
Eosinophils Relative: 3 %
HCT: 29.7 % — ABNORMAL LOW (ref 39.0–52.0)
Hemoglobin: 10.2 g/dL — ABNORMAL LOW (ref 13.0–17.0)
Immature Granulocytes: 1 %
Lymphocytes Relative: 18 %
Lymphs Abs: 0.8 10*3/uL (ref 0.7–4.0)
MCH: 32.5 pg (ref 26.0–34.0)
MCHC: 34.3 g/dL (ref 30.0–36.0)
MCV: 94.6 fL (ref 80.0–100.0)
Monocytes Absolute: 0.4 10*3/uL (ref 0.1–1.0)
Monocytes Relative: 10 %
Neutro Abs: 3 10*3/uL (ref 1.7–7.7)
Neutrophils Relative %: 67 %
Platelets: 176 10*3/uL (ref 150–400)
RBC: 3.14 MIL/uL — ABNORMAL LOW (ref 4.22–5.81)
RDW: 13.3 % (ref 11.5–15.5)
WBC: 4.4 10*3/uL (ref 4.0–10.5)
nRBC: 0 % (ref 0.0–0.2)

## 2023-11-06 LAB — TOTAL PROTEIN, URINE DIPSTICK: Protein, ur: 30 mg/dL — AB

## 2023-11-06 MED ORDER — FUROSEMIDE 20 MG PO TABS: 20.0000 mg | ORAL_TABLET | ORAL | 1 refills | Status: DC | PRN

## 2023-11-06 MED ORDER — HEPARIN SOD (PORK) LOCK FLUSH 100 UNIT/ML IV SOLN
500.0000 [IU] | Freq: Once | INTRAVENOUS | Status: AC
  Administered 2023-11-06: 500 [IU] via INTRAVENOUS
  Filled 2023-11-06: qty 5

## 2023-11-06 NOTE — Progress Notes (Signed)
 Nutrition  No treatment today.    RD called patient but no answer.  Left message with call back number.  Madigan Rosensteel B. Dasie SOLON, CSO, LDN Registered Dietitian [phone]

## 2023-11-06 NOTE — Progress Notes (Signed)
 Hematology/Oncology Progress note [phone] [phone]    Chief Complaint: Jared Gutierrez is a 83 y.o.  male presents for follow-up of metastatic colon cancer   ASSESSMENT & PLAN:   Cancer Staging  Cancer of transverse colon [facility]) Staging form: Colon and Rectum, [name], rcN0, rcM1) - Signed by Babara Call, MD on 10/26/2021   Cancer of transverse colon [facility]) CT images shows slight lung progression -Finished palliative radiation on 03/05/2023. March 2025 CT scan shows stable disease.  Labs are reviewed and discussed with patient. Proceed with 5-FU and Bevavizumab  Repeat CT scan findings are reviewed with patient and wife. Stable disease.  Shared decision was made to give patient 6-8 weeks of chemo holiday.  CA 19.9 is elevated, central abdominal mesentery soft tissue, ? Nodal metastatic disease vs pancreatic cancer.  Will check with IR to see if this area is feasible for biopsy   Lung nodules Stable  small lung nodule Attention on follow up   Encounter for antineoplastic chemotherapy Chemotherapy plan as listed above.   Anemia due to chemotherapy Continue to monitor.    Follow-up 6 weeks  All questions were answered. The patient knows to call the clinic with any problems, questions or concerns.  Call Babara, MD, PhD [facility] Hematology Oncology 11/06/2023     PERTINENT ONCOLOGY HISTORY Jared Gutierrez is a 83 y.o.[place] who has above oncology history reviewed by me today presented for follow up visit for management of Metastatic colon cancer. [name], patient switched care to me on 11/11/20 Extensive medical record review was performed by me  Oncology History  History of colon cancer, stage I  10/02/2014 Pathology Results   [id number] colonoscopy pathology A. CECAL POLYPS; HOT SNARE AND COLD BIOPSY:  - TUBULAR ADENOMA, MULTIPLE FRAGMENTS.  - NEGATIVE FOR  HIGH-GRADE DYSPLASIA AND MALIGNANCY.   B. COLON POLYP, TRANSVERSE; COLD BIOPSY:  - TUBULAR ADENOMA.  - NEGATIVE FOR HIGH-GRADE DYSPLASIA AND MALIGNANCY.     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  Tumor extended into the submucosa. Margins were negative. + lymphovascular invasion. 14 lymph nodes were negative. Pathologic stage was T1 N0.  TRANSVERSE COLON, RESECTION:  - INVASIVE ADENOCARCINOMA ARISING IN A 4.6 CM TUBULOVILLOUS  ADENOMA WITH HIGH GRADE DYSPLASIA (MALIGNANT POLYP).  - SEE SUMMARY BELOW.  SABRA  ONCOLOGY SUMMARY: COLON AND RECTUM, RESECTION, AJCC 7TH EDITION  Specimen: transverse colon  Procedure: transverse colon resection  Tumor site: splenic flexure  Tumor size: 1.0 cm (invasive component)  Macroscopic tumor perforation: not specified  Histologic type: invasive adenocarcinoma  Histologic grade: moderately differentiated  Microscopic tumor extension: into submucosa  Margins:  Proximal margin: negative       Distal margin: negative       Circumferential (radial) or mesenteric margin: negative       If all margins uninvolved by invasive carcinoma:       Distance of invasive carcinoma from closest margin:  7.5 cm  to distal margin  Treatment effect: not applicable  Lymph-vascular invasion: present  Perineural invasion: not identified  Tumor deposits (discontinuous extramural extension): not  identified  Pathologic staging:       Primary tumor: pT1       Regional lymph nodes: pN0            Number of nodes examined: 14            Number of nodes involved: 0    04/02/2019 Progression   04/02/2019  PET scan  limited evealed a 2.4 x 2.3 cm (SUV 11) hypermetabolic soft tissue density caudal and anterior to the pancreatic neck favored to represent isolated peritoneal or nodal metastasis in the setting of prior transverse colonic resection (expected primary drainage). Although this was immediately adjacent to the pancreas, a fat plane was maintained, arguing strongly against a pancreatic primary. Otherwise, there was no evidence of hypermetabolic metastasis. 04/17/2019 EUS on  revealed a normal esophagus, stomach, duodenum, and pancreas.  There was a 2.4 x 2.4 cm irregular mass in the retroperitoneum adjacent to, but not involving the pancreatic neck.  FNA and core needle biopsy were performed.  Pathology revealed adenocarcinoma compatible with a metastatic lesion of colorectal origin.  Tumor cells were positive for CK20 and CDX2 and negative for CK7.  NGS: [employer] on 05/15/2019 revealed + [id number] and TP53.  Negative results included BRAF V600E, Her2, NRAS, NTRK, PD-L1 (<1%), and TMB 8.7/Mb (intermediate).  MMR testing from his colon resection on 10/16/2013 was intact with a low probability of MSI-H.   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  He received Neulasta  after cycle #4 and #5 secondary to progressive leukopenia.  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  He received a truncated course of FOLFOX with cycle #11 secondary to  oxaliplatin  reaction.   01/14/2020 Imaging    PET revealed an interval decrease in size and FDG uptake (2.4 x 2.3 cm with SUV 11 to 2.4 x 1.7 cm with SUV 4.09) associated with the previously referenced soft tissue density caudal and anterior to the pancreatic neck suggesting treatment response. There were no new sites of FDG avid tumor.   08/31/2020 Imaging   08/31/2020 Abdomen and pelvis CT revealed increased size of the index soft tissue lesion inferior and anterior to the pancreatic neck (2.9 x 1.8 cm to 3.5 x 2.9 cm). There were similar prominent retroperitoneal lymph nodes without adenopathy by size criteria. There was no new or enlarging abdominal or pelvic lymph nodes. There were no new interval findings. There was similar circumferential wall thickening of a nondistended urinary bladder, which likely accentuated wall thickening There was hepatic steatosis and aortic atherosclerosis.   11/03/2020, PET showed recurrent peritoneal metastasis in the upper abdomen adjacent to the pancreas.  The lesion is 3.8 x 2.9 cm with SUV of 11.3. No evidence of metastatic peritoneal disease elsewhere in the abdomen pelvis.  No evidence of distant metastasis.   11/03/2020 Imaging   PET scan showed recurrent peritoneal metastasis near pancreas. Given that  he has no other distant metastasis. Discussed with radiation oncology. Repeat SBRT may be considered if he does not respond well to systemic chemotherapy.     11/26/2020 -  Chemotherapy   5-FU and bevacizumab .   02/17/2021 Imaging   02/17/2021 CT abdomen pelvis showed partial response. Mild decrease of peritoneal lesion.  Proceed with 5-FU and bevacizumab  today.  Given that he is tolerating current regimen with good life quality, partial response.  Shared decision was made to hold off adding additional chemotherapy agents.   06/08/2021, CT chest abdomen pelvis with contrast showed soft tissue mass at the base of mesentery inferior to the pancreatic neck is  unchanged in size.  No progressive disease was identified.   09/07/2021, CT chest abdomen pelvis with contrast showed no substantial changes in size of the mesenteric mass, 2.6 cm.  No suspicious new lesions.  Chronic findings as detailed in the imaging report.   12/05/2021 Imaging   PET scan showed soft tissue mesenteric mass is unchanged in size.  Decreased FDG uptake compared to activity on November 03, 2020.  Fever treated disease.  No new metastatic disease.   03/21/2022 Imaging   CT chest abdomen pelvis  1. Similar size of the peripancreatic presumed peritoneal implant compared to 09/07/2021. 2. No new or progressive disease. 3. Incidental findings, including: Coronary artery atherosclerosis.Aortic Atherosclerosis (ICD10-I70.0). Prostatomegaly with chronic bladder wall thickening, suggesting outlet obstruction.     07/11/2022 Imaging   CT chest abdomen pelvis with contrast showed 1. New 6 mm nodule in the right upper lobe and 2 mm nodule in the left upper lobe. Although small these are concerning. Recommend short follow-up in 3 months 2. Stable low-attenuation lesion along the midbody of the pancreas. Continued attention on follow-up. No new peritoneal mass lesions or ascites. 3. Fatty liver infiltration 4. Colonic surgical changes   10/03/2022 Imaging   CT chest abdomen pelvis w contrast showed 1. Slight interval enlargement and cavitation of a nodule in the right upper lobe, measuring 0.6 cm, previously 0.5 cm. Slight interval enlargement of a left upper lobe nodule measuring 0.3 cm,previously 0.2 cm. Findings are concerning for slowly enlarging small pulmonary metastases. 2. No significant change in a lobulated soft tissue mass within the central small bowel mesentery abutting the inferior aspect of the pancreas, consistent with a stable, treated metastasis. 3. No other evidence of lymphadenopathy or metastatic disease in the chest, abdomen, or pelvis. 4. Prostatomegaly with diffuse wall  thickening of the relatively decompressed urinary bladder, likely secondary to chronic outlet obstruction. 5. Unchanged fibrotic scarring and bronchiectasis of the bilateral lung bases. This could be further assessed by pulmonary referral and dedicated interstitial lung disease protocol CT examination of the chest if and when clinically appropriate in the setting of known metastatic malignancy. Aortic Atherosclerosis    12/27/2022 Imaging   CT chest abdomen pelvis w contrast showed 1. Continued slight interval enlargement of a cavitary nodule in the right upper lobe measuring 0.7 x 0.6 cm, as well as a nodule in the peripheral left upper lobe measuring 0.5 cm. These are highly suspicious for slowly enlarging pulmonary metastases but remain below size threshold for reliable PET-CT characterization. 2. Unchanged, lobulated hypodense soft tissue mass in the central small bowel mesentery, consistent with treated metastatic disease. 3. Status post transverse colon resection and reanastomosis. 4. Thickening of the distal sigmoid colon and rectum, consistent with nonspecific infectious or inflammatory colitis. 5. Prostatomegaly with diffuse thickening of the urinary bladder wall, likely secondary to chronic outlet  obstruction.   Aortic Atherosclerosis (ICD10-I70.0).   01/09/2023 Imaging   PET scan showed Status post transverse colectomy. 4.2 cm soft tissue mass in the central abdominal mesentery, suspicious for residual/recurrent nodal metastasis. Additional 8 mm short axis left para-aortic nodal metastasis. 7 mm cavitary right upper lobe nodule and 5 mm left upper lobe nodule, both beneath the size threshold for PET sensitivity, but suspicious for small pulmonary metastases.   01/30/2023 -  Radiation Therapy   Palliative radiation to mesenteric soft tissue mass.    05/07/2023 Imaging   CT chest abdomen pelvis w contrast showed  Focal nodular area along the inferior margin of the midbody of  the pancreas similar to previous. There is also some abnormal soft tissue extending into the porta hepatis encasing the hepatic artery which compared to previous is also slightly increased.   New small soft tissue nodule adjacent to the SMA proximally.   Previous hypermetabolic node left para-aortic is similar in size to previous.   Stable bilateral small lung nodules. Right lesions again cavitary. No new lung nodules.   Stable surgical changes.  Chest port.     Imaging     08/06/2023 Imaging   CT chest abdomen pelvis w contrast showed Small nodules are again seen. These appears slightly smaller overall from previous. No new lung nodule identified at this time.   Lesion along the inferior margin of the midbody of the pancreas similar to the prior examination. Persistent ill-defined soft tissue extending along the porta hepatis encasing the course of the [facility] artery. Small lymph nodes elsewhere in the upper retroperitoneum are stable.      Other medical problems Chronic lower extremity edema. 12/24/2018, right lower extremity duplex negative for DVT.  Small right Baker's cyst. 08/16/2019, bilateral lower extremity duplex showed no DVT. 08/16/2019 - 08/17/2019 with right lower extremity cellulitis.  He was unable to bear weight.  He was treated with IV fluids, NSAIDs, colchicine , and broad antibiotics (vancomycin  and Cefepime ).  He was discharged on indomethacin  x 5 days and Keflex  500 mg TID x 5 days.  10/05/2020, colonoscopy showed internal hemorrhoids.  Otherwise normal examination.  INTERVAL HISTORY Jared Gutierrez is a 83 y.o. male who has above history reviewed by me today presents for follow up visit for management of recurrent metastatic colon cancer Appetite is fair,  gained 1 pound  + fatigue No nausea vomiting diarrhea, fever chills.   Review of Systems  Constitutional:  Positive for fatigue. Negative for appetite change, chills, diaphoresis, fever and unexpected  weight change.  HENT:   Negative for hearing loss, nosebleeds, sore throat and tinnitus.   Respiratory:  Negative for cough, hemoptysis and shortness of breath.   Cardiovascular:  Negative for chest pain and palpitations.  Gastrointestinal:  Negative for abdominal pain, blood in stool, constipation, diarrhea, nausea and vomiting.  Genitourinary:  Negative for dysuria, frequency and hematuria.   Musculoskeletal:  Positive for arthralgias. Negative for back pain, myalgias and neck pain.  Skin:  Negative for itching and rash.  Neurological:  Positive for numbness. Negative for dizziness and headaches.  Hematological:  Does not bruise/bleed easily.  Psychiatric/Behavioral:  Negative for depression. The patient is not nervous/anxious.       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   Chicken pox    Colon cancer (HCC)    History of kidney stones  Hypertension    Lipoma of colon    Nephrolithiasis    Nephrolithiasis    Obesity    Shingles    Tubular adenoma of colon    multiple fragments    Past Surgical History:  Procedure Laterality Date   COLON SURGERY     COLONOSCOPY N/A 10/02/2014   Procedure: COLONOSCOPY;  Surgeon: Donnice Vaughn Manes, MD;  Location[facility] ENDOSCOPY;  Service: Endoscopy;  Laterality: N/A;   COLONOSCOPY WITH PROPOFOL  N/A 02/16/2017   Procedure: COLONOSCOPY WITH PROPOFOL ;  Surgeon: Viktoria Lamar DASEN, MD;  Location: [facility] ENDOSCOPY;  Service: Endoscopy;  Laterality: N/A;   COLONOSCOPY WITH PROPOFOL  N/A 10/05/2020   Procedure: COLONOSCOPY WITH PROPOFOL ;  Surgeon: Maryruth Ole DASEN, MD;  Location: [facility] ENDOSCOPY;  Service: Endoscopy;  Laterality: N/A;   EUS N/A 04/17/2019   Procedure: FULL UPPER ENDOSCOPIC ULTRASOUND (EUS) RADIAL;  Surgeon: Queenie Asberry LABOR, MD;  Location: [facility] ENDOSCOPY;  Service: Gastroenterology;  Laterality: N/A;   KIDNEY STONE SURGERY     PARTIAL  COLECTOMY  10/17/2013   PORTACATH PLACEMENT Right 06/13/2019   Procedure: INSERTION PORT-A-CATH;  Surgeon: Tye Millet, DO;  Location: [facility] ORS;  Service: General;  Laterality: Right;    Family History  Problem Relation Age of Onset   Cancer Mother    Breast cancer Mother    COPD Father     Social History:  reports that he has never smoked. He has never used smokeless tobacco. He reports current alcohol use. He reports that he does not use drugs.    Allergies: No Known Allergies  Current Medications: Current Outpatient Medications  Medication Sig Dispense Refill   acetaminophen  (TYLENOL ) 500 MG tablet Take 500 mg by mouth every 6 (six) hours as needed.     amLODipine (NORVASC) 2.5 MG tablet Take 2.5 mg by mouth daily.     aspirin EC 81 MG tablet Take 81 mg by mouth daily.     Cholecalciferol (VITAMIN D) 125 MCG (5000 UT) CAPS Take 5,000 mg by mouth.     cyanocobalamin (VITAMIN B12) 500 MCG tablet Take by mouth.     docusate sodium  (COLACE) 100 MG capsule Take 1 capsule (100 mg total) by mouth 2 (two) times daily as needed for mild constipation or moderate constipation. 60 capsule 3   Ferrous Fumarate (HEMOCYTE - 106 MG FE) 324 (106 Fe) MG TABS tablet Take 1 tablet by mouth.     HYDROcodone -acetaminophen  (NORCO/VICODIN) 5-325 MG tablet Take 1 tablet by mouth every 6 (six) hours as needed for moderate pain (pain score 4-6) (up to 3 doses for moderate pain.).     lidocaine -prilocaine  (EMLA ) cream Apply small amount to port and cover with saran wrap 1-2 hours prior to port access 30 g 3   loperamide  (IMODIUM ) 2 MG capsule Take 1 capsule (2 mg total) by mouth See admin instructions. Take 2 tablets after first loose stool,  then 1 tablet  after each loose stool; maximum: 8 tablets /day 60 capsule 0   loratadine (CLARITIN) 10 MG tablet Take 10 mg by mouth daily.     megestrol  (MEGACE ) 40 MG tablet TAKE 2 TABLETS BY MOUTH 2 TIMES DAILY. 360 tablet 1   olmesartan (BENICAR) 20 MG tablet Take 1  tablet by mouth daily.     omeprazole  (PRILOSEC) 20 MG capsule Take 1 capsule (20 mg total) by mouth daily. 30 capsule 3   ondansetron  (ZOFRAN ) 8 MG tablet Take 1 tablet (8 mg total) by mouth 2 (two) times daily as needed  for refractory nausea / vomiting. Start on day 3 after chemotherapy. 60 tablet 1   potassium chloride  SA (KLOR-CON  M20) 20 MEQ tablet Take 1 tablet (20 mEq total) by mouth daily. 180 tablet 0   Probiotic Product (PROBIOTIC DAILY PO) Take 1 tablet by mouth daily.     prochlorperazine  (COMPAZINE ) 10 MG tablet Take 1 tablet (10 mg total) by mouth every 6 (six) hours as needed (NAUSEA). 30 tablet 1   senna-docusate (SENNA S) 8.6-50 MG tablet Take 2 tablets by mouth daily. 60 tablet 3   tamsulosin  (FLOMAX ) 0.4 MG CAPS capsule Take 1 capsule (0.4 mg total) by mouth daily after supper. 90 capsule 3   furosemide (LASIX) 20 MG tablet Take 1 tablet (20 mg total) by mouth as needed. Prn for swelling 30 tablet 1   No current facility-administered medications for this visit.   Facility-Administered Medications Ordered in Other Visits  Medication Dose Route Frequency Provider Last Rate Last Admin   0.9 %  sodium chloride  infusion   Intravenous Once Corcoran, Melissa C, MD       0.9 %  sodium chloride  infusion   Intravenous Continuous Corcoran, Melissa C, MD 10 mL/hr at 12/31/19 1000 New Bag at 10/05/20 1045   heparin  lock flush 100 unit/mL  500 Units Intravenous Once Corcoran, Melissa C, MD       sodium chloride  flush (NS) 0.9 % injection 10 mL  10 mL Intracatheter PRN Babara Call, MD   10 mL at 02/01/23 1341    Performance status (ECOG): 1  Vitals Blood pressure 112/63, pulse 65, temperature (!) 97.5 F (36.4 C), resp. rate 18, weight 199 lb 6.4 oz (90.4 kg), SpO2 95%.   Physical Exam Constitutional:      General: He is not in acute distress.    Appearance: He is obese. He is not diaphoretic.  HENT:     Head: Normocephalic and atraumatic.   Eyes:     General: No scleral  icterus.   Cardiovascular:     Rate and Rhythm: Normal rate and regular rhythm.     Heart sounds: No murmur heard. Pulmonary:     Effort: Pulmonary effort is normal. No respiratory distress.     Breath sounds: No wheezing.     Comments: Decreased breath sound bilaterally.  Abdominal:     General: Bowel sounds are normal. There is no distension.     Palpations: Abdomen is soft.   Musculoskeletal:        General: Normal range of motion.     Cervical back: Normal range of motion and neck supple.     Comments: +1 edema bilateral lower extremities.    Skin:    General: Skin is warm and dry.     Findings: No erythema.   Neurological:     Mental Status: He is alert and oriented to person, place, and time. Mental status is at baseline.     Motor: No abnormal muscle tone.   Psychiatric:        Mood and Affect: Mood and affect normal.     Labs were reviewed by me.     Latest Ref Rng & Units 11/06/2023    8:32 AM 10/16/2023    8:22 AM 10/02/2023    8:31 AM  CBC  WBC 4.0 - 10.5 K/uL 4.4  4.1  4.6   Hemoglobin 13.0 - 17.0 g/dL 89.7  89.5  89.3   Hematocrit 39.0 - 52.0 % 29.7  31.2  31.9   Platelets  150 - 400 K/uL 176  177  158       Latest Ref Rng & Units 11/06/2023    8:32 AM 10/16/2023    8:22 AM 10/02/2023    8:31 AM  CMP  Glucose 70 - 99 mg/dL 849  820  841   BUN 8 - 23 mg/dL 13  14  15    Creatinine 0.61 - 1.24 mg/dL 9.43  9.44  9.36   Sodium 135 - 145 mmol/L 134  136  136   Potassium 3.5 - 5.1 mmol/L 3.5  3.7  3.9   Chloride 98 - 111 mmol/L 109  105  105   CO2 22 - 32 mmol/L 20  24  24    Calcium  8.9 - 10.3 mg/dL 8.6  8.7  8.6   Total Protein 6.5 - 8.1 g/dL 5.3  5.7  5.8   Total Bilirubin 0.0 - 1.2 mg/dL 0.8  0.9  1.0   Alkaline Phos 38 - 126 U/L 42  47  45   AST 15 - 41 U/L 23  29  26    ALT 0 - 44 U/L 15  16  14

## 2023-11-06 NOTE — Assessment & Plan Note (Signed)
 Continue to monitor

## 2023-11-06 NOTE — Assessment & Plan Note (Addendum)
 CT images shows slight lung progression -Finished palliative radiation on 03/05/2023. March 2025 CT scan shows stable disease.  Labs are reviewed and discussed with patient. Proceed with 5-FU and Bevavizumab  Repeat CT scan findings are reviewed with patient and wife. Stable disease.  Shared decision was made to give patient 6-8 weeks of chemo holiday.  CA 19.9 is elevated, central abdominal mesentery soft tissue, ? Nodal metastatic disease vs pancreatic cancer.  Will check with IR to see if this area is feasible for biopsy

## 2023-11-06 NOTE — Assessment & Plan Note (Signed)
 Stable  small lung nodule Attention on follow up

## 2023-11-06 NOTE — Patient Instructions (Signed)

## 2023-11-06 NOTE — Assessment & Plan Note (Signed)
 Chemotherapy plan as listed above

## 2023-11-07 ENCOUNTER — Other Ambulatory Visit: Payer: Self-pay

## 2023-11-07 LAB — CEA: CEA: 23.8 ng/mL — ABNORMAL HIGH (ref 0.0–4.7)

## 2023-11-08 ENCOUNTER — Inpatient Hospital Stay

## 2023-11-16 ENCOUNTER — Other Ambulatory Visit: Payer: Self-pay

## 2023-12-02 ENCOUNTER — Other Ambulatory Visit: Payer: Self-pay | Admitting: Oncology

## 2023-12-03 ENCOUNTER — Encounter: Payer: Self-pay | Admitting: Oncology

## 2023-12-04 ENCOUNTER — Encounter: Payer: Self-pay | Admitting: Emergency Medicine

## 2023-12-04 ENCOUNTER — Telehealth: Payer: Self-pay | Admitting: *Deleted

## 2023-12-04 ENCOUNTER — Emergency Department

## 2023-12-04 ENCOUNTER — Other Ambulatory Visit: Payer: Self-pay

## 2023-12-04 ENCOUNTER — Inpatient Hospital Stay
Admission: EM | Admit: 2023-12-04 | Discharge: 2023-12-07 | DRG: 389 | Disposition: A | Discharge status: Home Health | Attending: Internal Medicine | Admitting: Internal Medicine

## 2023-12-04 DIAGNOSIS — I10 Essential (primary) hypertension: Secondary | ICD-10-CM | POA: Diagnosis present

## 2023-12-04 DIAGNOSIS — Z6830 Body mass index (BMI) 30.0-30.9, adult: Secondary | ICD-10-CM

## 2023-12-04 DIAGNOSIS — Z7982 Long term (current) use of aspirin: Secondary | ICD-10-CM

## 2023-12-04 DIAGNOSIS — Z66 Do not resuscitate: Secondary | ICD-10-CM | POA: Diagnosis present

## 2023-12-04 DIAGNOSIS — K567 Ileus, unspecified: Secondary | ICD-10-CM | POA: Diagnosis not present

## 2023-12-04 DIAGNOSIS — I700 Atherosclerosis of aorta: Secondary | ICD-10-CM | POA: Diagnosis present

## 2023-12-04 DIAGNOSIS — K56609 Unspecified intestinal obstruction, unspecified as to partial versus complete obstruction: Principal | ICD-10-CM

## 2023-12-04 DIAGNOSIS — E66811 Obesity, class 1: Secondary | ICD-10-CM | POA: Diagnosis present

## 2023-12-04 DIAGNOSIS — Z79899 Other long term (current) drug therapy: Secondary | ICD-10-CM

## 2023-12-04 DIAGNOSIS — N401 Enlarged prostate with lower urinary tract symptoms: Secondary | ICD-10-CM

## 2023-12-04 DIAGNOSIS — I452 Bifascicular block: Secondary | ICD-10-CM | POA: Diagnosis present

## 2023-12-04 DIAGNOSIS — Z860101 Personal history of adenomatous and serrated colon polyps: Secondary | ICD-10-CM

## 2023-12-04 DIAGNOSIS — E669 Obesity, unspecified: Secondary | ICD-10-CM | POA: Diagnosis present

## 2023-12-04 DIAGNOSIS — R109 Unspecified abdominal pain: Secondary | ICD-10-CM | POA: Diagnosis present

## 2023-12-04 DIAGNOSIS — Z825 Family history of asthma and other chronic lower respiratory diseases: Secondary | ICD-10-CM

## 2023-12-04 DIAGNOSIS — R627 Adult failure to thrive: Secondary | ICD-10-CM | POA: Diagnosis present

## 2023-12-04 DIAGNOSIS — Z9049 Acquired absence of other specified parts of digestive tract: Secondary | ICD-10-CM

## 2023-12-04 DIAGNOSIS — C184 Malignant neoplasm of transverse colon: Secondary | ICD-10-CM | POA: Diagnosis not present

## 2023-12-04 DIAGNOSIS — Z803 Family history of malignant neoplasm of breast: Secondary | ICD-10-CM

## 2023-12-04 DIAGNOSIS — E872 Acidosis, unspecified: Secondary | ICD-10-CM | POA: Diagnosis present

## 2023-12-04 DIAGNOSIS — R197 Diarrhea, unspecified: Secondary | ICD-10-CM | POA: Diagnosis not present

## 2023-12-04 DIAGNOSIS — R918 Other nonspecific abnormal finding of lung field: Secondary | ICD-10-CM | POA: Diagnosis present

## 2023-12-04 DIAGNOSIS — N400 Enlarged prostate without lower urinary tract symptoms: Secondary | ICD-10-CM | POA: Diagnosis present

## 2023-12-04 DIAGNOSIS — E871 Hypo-osmolality and hyponatremia: Secondary | ICD-10-CM | POA: Diagnosis present

## 2023-12-04 DIAGNOSIS — K5981 Ogilvie syndrome: Secondary | ICD-10-CM | POA: Diagnosis present

## 2023-12-04 DIAGNOSIS — N390 Urinary tract infection, site not specified: Secondary | ICD-10-CM | POA: Diagnosis present

## 2023-12-04 LAB — COMPREHENSIVE METABOLIC PANEL WITH GFR
ALT: 17 U/L (ref 0–44)
AST: 28 U/L (ref 15–41)
Albumin: 3.3 g/dL — ABNORMAL LOW (ref 3.5–5.0)
Alkaline Phosphatase: 49 U/L (ref 38–126)
Anion gap: 9 (ref 5–15)
BUN: 15 mg/dL (ref 8–23)
CO2: 19 mmol/L — ABNORMAL LOW (ref 22–32)
Calcium: 9.7 mg/dL (ref 8.9–10.3)
Chloride: 103 mmol/L (ref 98–111)
Creatinine, Ser: 0.68 mg/dL (ref 0.61–1.24)
GFR, Estimated: >60 mL/min (ref >60)
Glucose, Bld: 137 mg/dL — ABNORMAL HIGH (ref 70–99)
Potassium: 4.6 mmol/L (ref 3.5–5.1)
Sodium: 131 mmol/L — ABNORMAL LOW (ref 135–145)
Total Bilirubin: 0.8 mg/dL (ref 0.0–1.2)
Total Protein: 6.2 g/dL — ABNORMAL LOW (ref 6.5–8.1)

## 2023-12-04 LAB — URINALYSIS, ROUTINE W REFLEX MICROSCOPIC
Bacteria, UA: NONE SEEN
Bilirubin Urine: NEGATIVE
Glucose, UA: NEGATIVE mg/dL
Hgb urine dipstick: NEGATIVE
Ketones, ur: NEGATIVE mg/dL
Nitrite: NEGATIVE
Protein, ur: 30 mg/dL — AB
Specific Gravity, Urine: 1.014 (ref 1.005–1.030)
pH: 5 (ref 5.0–8.0)

## 2023-12-04 LAB — CBC
HCT: 35.9 % — ABNORMAL LOW (ref 39.0–52.0)
Hemoglobin: 12.3 g/dL — ABNORMAL LOW (ref 13.0–17.0)
MCH: 33 pg (ref 26.0–34.0)
MCHC: 34.3 g/dL (ref 30.0–36.0)
MCV: 96.2 fL (ref 80.0–100.0)
Platelets: 202 K/uL (ref 150–400)
RBC: 3.73 MIL/uL — ABNORMAL LOW (ref 4.22–5.81)
RDW: 12.5 % (ref 11.5–15.5)
WBC: 4.7 K/uL (ref 4.0–10.5)
nRBC: 0 % (ref 0.0–0.2)

## 2023-12-04 LAB — LIPASE, BLOOD: Lipase: 28 U/L (ref 11–51)

## 2023-12-04 LAB — PROTIME-INR
INR: 1.2 (ref 0.8–1.2)
Prothrombin Time: 16.1 s — ABNORMAL HIGH (ref 11.4–15.2)

## 2023-12-04 LAB — APTT: aPTT: 30 s (ref 24–36)

## 2023-12-04 MED ORDER — ASPIRIN 81 MG PO TBEC
81.0000 mg | DELAYED_RELEASE_TABLET | Freq: Every day | ORAL | Status: DC
  Administered 2023-12-05 – 2023-12-07 (×3): 81 mg via ORAL
  Filled 2023-12-04 (×3): qty 1

## 2023-12-04 MED ORDER — PANTOPRAZOLE SODIUM 40 MG PO TBEC
40.0000 mg | DELAYED_RELEASE_TABLET | Freq: Every day | ORAL | Status: DC
  Administered 2023-12-05 – 2023-12-07 (×3): 40 mg via ORAL
  Filled 2023-12-04 (×3): qty 1

## 2023-12-04 MED ORDER — HYDRALAZINE HCL 20 MG/ML IJ SOLN: 5.0000 mg | INTRAMUSCULAR | Status: DC | PRN

## 2023-12-04 MED ORDER — HEPARIN SODIUM (PORCINE) 5000 UNIT/ML IJ SOLN
5000.0000 [IU] | Freq: Three times a day (TID) | INTRAMUSCULAR | Status: DC
  Administered 2023-12-04 – 2023-12-07 (×8): 5000 [IU] via SUBCUTANEOUS
  Filled 2023-12-04 (×8): qty 1

## 2023-12-04 MED ORDER — LACTATED RINGERS IV BOLUS
1000.0000 mL | Freq: Once | INTRAVENOUS | Status: AC
  Administered 2023-12-04: 1000 mL via INTRAVENOUS

## 2023-12-04 MED ORDER — SODIUM CHLORIDE 0.9 % IV SOLN
2.0000 g | INTRAVENOUS | Status: DC
  Administered 2023-12-04 – 2023-12-05 (×2): 2 g via INTRAVENOUS
  Filled 2023-12-04 (×3): qty 20

## 2023-12-04 MED ORDER — ACETAMINOPHEN 325 MG PO TABS: 650.0000 mg | ORAL_TABLET | Freq: Four times a day (QID) | ORAL | Status: DC | PRN

## 2023-12-04 MED ORDER — TAMSULOSIN HCL 0.4 MG PO CAPS
0.4000 mg | ORAL_CAPSULE | Freq: Every day | ORAL | Status: DC
  Administered 2023-12-05: 0.4 mg via ORAL
  Filled 2023-12-04 (×2): qty 1

## 2023-12-04 MED ORDER — ONDANSETRON HCL 4 MG/2ML IJ SOLN
4.0000 mg | Freq: Three times a day (TID) | INTRAMUSCULAR | Status: DC | PRN
  Administered 2023-12-05: 4 mg via INTRAVENOUS
  Filled 2023-12-04: qty 2

## 2023-12-04 MED ORDER — IOHEXOL 300 MG/ML  SOLN
100.0000 mL | Freq: Once | INTRAMUSCULAR | Status: AC | PRN
  Administered 2023-12-04: 100 mL via INTRAVENOUS

## 2023-12-04 MED ORDER — AMLODIPINE BESYLATE 5 MG PO TABS
2.5000 mg | ORAL_TABLET | Freq: Every day | ORAL | Status: DC
  Administered 2023-12-05 – 2023-12-07 (×3): 2.5 mg via ORAL
  Filled 2023-12-04 (×3): qty 1

## 2023-12-04 MED ORDER — IRBESARTAN 150 MG PO TABS
150.0000 mg | ORAL_TABLET | Freq: Every day | ORAL | Status: DC
  Administered 2023-12-05 – 2023-12-07 (×3): 150 mg via ORAL
  Filled 2023-12-04 (×3): qty 1

## 2023-12-04 MED ORDER — SODIUM CHLORIDE 0.9 % IV SOLN: INTRAVENOUS | Status: DC

## 2023-12-04 NOTE — Telephone Encounter (Signed)
 I spoke to Dr. Babara and she says he has been off of treatment so it should not be dehydrated so she thinks something else might be going on and she thinks that they need to take him to the ER.  The wife wanted me to call the ER about this and I did call and speak to the person in charge and they said they will be waiting for him.  The wife said that she was going to have to help him get up and get a shower and then go over

## 2023-12-04 NOTE — ED Provider Notes (Signed)
 [facility] Provider Note    Event Date/Time   First MD Initiated Contact with Patient 12/04/23 1534     (approximate)   History   Chief Complaint Weakness   HPI  Jared Gutierrez is a 83 y.o. male with past medical history of hypertension, GERD, and colon cancer who presents to the ED complaining of weakness.  Patient reports that he has been feeling increasingly weak in general over the past 7 days with discomfort in his upper abdomen when he goes to eat.  He states that his stomach feels sour but that he feels okay when he has an empty stomach.  He states he will pass loose and green-colored stool, denies any blood in his stool and has not had any nausea or vomiting.  He does report decreased oral intake overall and is concerned he may be dehydrated.  He denies any fevers but does report some urinary frequency and dysuria, denies flank pain.  He is currently on a chemo holiday, has not received treatment in about 1 month.     Physical Exam   Triage Vital Signs: ED Triage Vitals [12/04/23 1032]  Encounter Vitals Group     BP 110/67     Girls Systolic BP Percentile      Girls Diastolic BP Percentile      Boys Systolic BP Percentile      Boys Diastolic BP Percentile      Pulse Rate 75     Resp 16     Temp 97.6 F (36.4 C)     Temp src      SpO2 97 %     Weight 199 lb (90.3 kg)     Height 5' 8 (1.727 m)     Head Circumference      Peak Flow      Pain Score 0     Pain Loc      Pain Education      Exclude from Growth Chart     Most recent vital signs: Vitals:   12/04/23 1700 12/04/23 1730  BP: (!) 152/82 (!) 161/84  Pulse: 79   Resp: 10 11  Temp:    SpO2: 98%     Constitutional: Alert and oriented. Eyes: Conjunctivae are normal. Head: Atraumatic. Nose: No congestion/rhinnorhea. Mouth/Throat: Mucous membranes are dry. Cardiovascular: Normal rate, regular rhythm. Grossly normal heart sounds.  2+ radial pulses  bilaterally. Respiratory: Normal respiratory effort.  No retractions. Lungs CTAB. Gastrointestinal: Soft and tender to palpation in the epigastrium with no rebound or guarding. No distention. Musculoskeletal: No lower extremity tenderness nor edema.  Neurologic:  Normal speech and language. No gross focal neurologic deficits are appreciated.    ED Results / Procedures / Treatments   Labs (all labs ordered are listed, but only abnormal results are displayed) Labs Reviewed  COMPREHENSIVE METABOLIC PANEL WITH GFR - Abnormal; Notable for the following components:      Result Value   Sodium 131 (*)    CO2 19 (*)    Glucose, Bld 137 (*)    Total Protein 6.2 (*)    Albumin 3.3 (*)    All other components within normal limits  CBC - Abnormal; Notable for the following components:   RBC 3.73 (*)    Hemoglobin 12.3 (*)    HCT 35.9 (*)    All other components within normal limits  URINALYSIS, ROUTINE W REFLEX MICROSCOPIC - Abnormal; Notable for the following components:   Color, Urine YELLOW (*)  APPearance CLEAR (*)    Protein, ur 30 (*)    Leukocytes,Ua TRACE (*)    All other components within normal limits  URINE CULTURE  LIPASE, BLOOD  PROTIME-INR  APTT  CBG MONITORING, ED     EKG  ED ECG REPORT I, Carlin Palin, the attending physician, personally viewed and interpreted this ECG.   Date: 12/04/2023  EKG Time: 10:32  Rate: 76  Rhythm: normal sinus rhythm  Axis: Normal  Intervals:first-degree A-V block  and right bundle branch block  ST&T Change: None  RADIOLOGY CT abdomen/pelvis reviewed and interpreted by me with some dilation of the large bowel, no inflammatory changes.  PROCEDURES:  Critical Care performed: No  Procedures   MEDICATIONS ORDERED IN ED: Medications  ondansetron  (ZOFRAN ) injection 4 mg (has no administration in time range)  hydrALAZINE  (APRESOLINE ) injection 5 mg (has no administration in time range)  acetaminophen  (TYLENOL ) tablet 650 mg  (has no administration in time range)  lactated ringers  bolus 1,000 mL (1,000 mLs Intravenous New Bag/Given 12/04/23 1639)  iohexol  (OMNIPAQUE ) 300 MG/ML solution 100 mL (100 mLs Intravenous Contrast Given 12/04/23 1747)     IMPRESSION / MDM / ASSESSMENT AND PLAN / ED COURSE  I reviewed the triage vital signs and the nursing notes.                              83 y.o. male with past medical history of hypertension, GERD, and colon cancer who presents to the ED complaining of abdominal discomfort and green stool for the past week, with pain being worse when he eats.  Patient's presentation is most consistent with acute presentation with potential threat to life or bodily function.  Differential diagnosis includes, but is not limited to, ACS, gastritis, GERD, pancreatitis, hepatitis, cholecystitis, biliary colic, bowel obstruction, UTI, kidney stone.  Patient nontoxic-appearing and in no acute distress, vital signs are unremarkable.  His abdomen is soft but he does have tenderness to palpation in the epigastrium, will further assess with CT imaging.  Labs without significant anemia, leukocytosis, electrolyte abnormality, or AKI.  LFTs and lipase are unremarkable, urinalysis borderline for infection and we will send for culture.  Plan to hydrate with IV fluids, patient declines pain or nausea medication at this time.  EKG shows no evidence of arrhythmia or ischemia and I have low suspicion for cardiac etiology.  CT imaging concerning for narrowing of the large bowel with proximal dilation, could represent ileus versus Ogilvie syndrome.  Case discussed with Dr. Jordis of general surgery, who request admission to the hospitalist service and he will see the patient in the morning.  Case discussed with hospitalist for admission.      FINAL CLINICAL IMPRESSION(S) / ED DIAGNOSES   Final diagnoses:  Large bowel obstruction (HCC)     Rx / DC Orders   ED Discharge Orders     None         Note:  This document was prepared using Dragon voice recognition software and may include unintentional dictation errors.   Palin Carlin, MD 12/04/23 JUDITHANN

## 2023-12-04 NOTE — ED Triage Notes (Signed)
 Pt to ED via POV. Pt was sent by cancer doctor. Pt has colon cancer and is on chemo but has not had chemo in 4 weeks. Pt has been having weakness and wife was advised to bring him in because it may be related to the cancer. Pt states that he is also having abdominal pain when he eats. Pt is in NAD.

## 2023-12-04 NOTE — H&P (Signed)
 History and Physical    Jared Gutierrez FMW:[id number] DOB: [birth year] DOA: 12/04/2023  Referring MD/NP/PA:   PCP: Sadie Manna, MD   Patient coming from:  The patient is coming from home.     Chief Complaint: Abdominal pain, dysuria  HPI: Jared Gutierrez is a 83 y.o. 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  Per patient and his daughter and wife at the bedside, patient has colon cancer on chemotherapy, currently is on chemo holiday in the past 4 weeks. Pt developed generalized weakness.  He has nausea, diarrhea and abdominal pain, no vomiting.  His abdominal pain is located in the lower abdomen, which is constant, mild, aching, nonradiating.  Not aggravated alleviated by any known factors.  He states that he has diarrhea 3-4 times each day.  Patient reports dysuria, burning with urination and urinary frequency.  No hematuria.  No chest pain, cough, SOB.  No fever or chills.  Data reviewed independently and ED Course: pt was found to have WBC 4.3, GFR> 60, UA (clear appearance, trace amount of leukocyte, negative bacteria, WBC 6-10), temperature normal, blood pressure 119/84, heart rate 65, RR 16, oxygen saturation 97% on room air.  Patient is placed in MedSurg bed for observation.  Dr. Jordis of surgery is consulted.   CT of abdomen/pelvis: 1. Diffuse gaseous colonic distension with colonic redundancy. There is narrowing in the sigmoid colon spanning 6.2 cm. This colonic segment is nondistended and not well assessed. 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 2. Soft tissue mass in the pancreas is unchanged or minimally decreased in size from prior exam. There is motion artifact through this region which limits detailed assessment. Soft tissue density at the root of the hepatic artery is similar. 3. Additional chronic findings as  described.   Aortic Atherosclerosis (ICD10-I70.0).    EKG: I have personally reviewed.  Sinus rhythm, QTc 441, bifascicular block, early R wave progression.   Review of Systems:   General: no fevers, chills, no body weight gain, has poor appetite, has fatigue HEENT: no blurry vision, hearing changes or sore throat Respiratory: no dyspnea, coughing, wheezing CV: no chest pain, no palpitations GI: has nausea, abdominal pain, diarrhea, no constipation, vomiting, GU: has dysuria, burning on urination, increased urinary frequency, no hematuria  Ext: no leg edema Neuro: no unilateral weakness, numbness, or tingling, no vision change or hearing loss Skin: no rash, no skin tear. MSK: No muscle spasm, no deformity, no limitation of range of movement in spin Heme: No easy bruising.  Travel history: No recent long distant travel.   Allergy: No Known Allergies  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   Chicken pox    Colon cancer (HCC)    History of kidney stones    Hypertension    Lipoma of colon    Nephrolithiasis    Nephrolithiasis    Obesity    Shingles    Tubular adenoma of colon    multiple fragments    Past Surgical History:  Procedure Laterality Date   COLON SURGERY     COLONOSCOPY N/A 10/02/2014   Procedure: COLONOSCOPY;  Surgeon: Donnice Vaughn Manes, MD;  Location: [facility] ENDOSCOPY;  Service: Endoscopy;  Laterality: N/A;   COLONOSCOPY WITH PROPOFOL  N/A 02/16/2017  Procedure: COLONOSCOPY WITH PROPOFOL ;  Surgeon: Viktoria Lamar DASEN, MD;  Location: [facility] ENDOSCOPY;  Service: Endoscopy;  Laterality: N/A;   COLONOSCOPY WITH PROPOFOL  N/A 10/05/2020   Procedure: COLONOSCOPY WITH PROPOFOL ;  Surgeon: Maryruth Ole DASEN, MD;  Location: [facility] ENDOSCOPY;  Service: Endoscopy;  Laterality: N/A;   EUS N/A 04/17/2019   Procedure: FULL UPPER ENDOSCOPIC ULTRASOUND (EUS) RADIAL;  Surgeon: Queenie Asberry LABOR, MD;  Location: [facility] ENDOSCOPY;  Service: Gastroenterology;  Laterality: N/A;   KIDNEY STONE SURGERY     PARTIAL COLECTOMY  10/17/2013   PORTACATH PLACEMENT Right 06/13/2019   Procedure: INSERTION PORT-A-CATH;  Surgeon: Tye Millet, DO;  Location: [facility] ORS;  Service: General;  Laterality: Right;    Social History:  reports that he has never smoked. He has never used smokeless tobacco. He reports current alcohol use. He reports that he does not use drugs.  Family History:  Family History  Problem Relation Age of Onset   Cancer Mother    Breast cancer Mother    COPD Father      Prior to Admission medications   Medication Sig Start Date End Date Taking? Authorizing Provider  acetaminophen  (TYLENOL ) 500 MG tablet Take 500 mg by mouth every 6 (six) hours as needed.    [provider]  amLODipine  (NORVASC ) 2.5 MG tablet Take 2.5 mg by mouth daily.    [provider]  aspirin  EC 81 MG tablet Take 81 mg by mouth daily.    [provider]  Cholecalciferol (VITAMIN D) 125 MCG (5000 UT) CAPS Take 5,000 mg by mouth.    [provider]  cyanocobalamin (VITAMIN B12) 500 MCG tablet Take by mouth. 07/26/23 07/25/24  [provider]  docusate sodium  (COLACE) 100 MG capsule Take 1 capsule (100 mg total) by mouth 2 (two) times daily as needed for mild constipation or moderate constipation. 03/27/23   Babara Call, MD  Ferrous Fumarate (HEMOCYTE - 106 MG FE) 324 (106 Fe) MG TABS tablet Take 1 tablet by mouth.    [provider]  furosemide  (LASIX ) 20 MG tablet Take 1 tablet (20 mg total) by mouth as needed. Prn for swelling 11/06/23   Babara Call, MD  HYDROcodone -acetaminophen  (NORCO/VICODIN) 5-325 MG tablet Take 1 tablet by mouth every 6 (six) hours as needed for moderate pain (pain score 4-6) (up to 3 doses for moderate pain.).    [provider]  lidocaine -prilocaine  (EMLA ) cream Apply small amount to port and cover with saran wrap 1-2 hours  prior to port access 08/27/23   Babara Call, MD  loperamide  (IMODIUM ) 2 MG capsule Take 1 capsule (2 mg total) by mouth See admin instructions. Take 2 tablets after first loose stool,  then 1 tablet  after each loose stool; maximum: 8 tablets /day 02/13/23   Babara Call, MD  loratadine (CLARITIN) 10 MG tablet Take 10 mg by mouth daily.    [provider]  megestrol  (MEGACE ) 40 MG tablet TAKE 2 TABLETS BY MOUTH 2 TIMES DAILY. 03/26/23   Babara Call, MD  olmesartan (BENICAR) 20 MG tablet Take 1 tablet by mouth daily. 10/14/21   [provider]  omeprazole  (PRILOSEC) 20 MG capsule Take 1 capsule (20 mg total) by mouth daily. 09/18/23   Babara Call, MD  ondansetron  (ZOFRAN ) 8 MG tablet Take 1 tablet (8 mg total) by mouth 2 (two) times daily as needed for refractory nausea / vomiting. Start on day 3 after chemotherapy. 11/16/20   Babara Call, MD  potassium chloride  SA (KLOR-CON   M20) 20 MEQ tablet Take 1 tablet (20 mEq total) by mouth daily. 01/30/23   Babara Call, MD  Probiotic Product (PROBIOTIC DAILY PO) Take 1 tablet by mouth daily.    [provider]  prochlorperazine  (COMPAZINE ) 10 MG tablet Take 1 tablet (10 mg total) by mouth every 6 (six) hours as needed (NAUSEA). 11/16/20   Babara Call, MD  senna-docusate (SENNA S) 8.6-50 MG tablet Take 2 tablets by mouth daily. 04/25/22   Babara Call, MD  tamsulosin  (FLOMAX ) 0.4 MG CAPS capsule Take 1 capsule (0.4 mg total) by mouth daily after supper. 09/04/23   Lenn Aran, MD    Physical Exam: Vitals:   12/04/23 1700 12/04/23 1730 12/04/23 1945 12/04/23 2000  BP: (!) 152/82 (!) 161/84  137/75  Pulse: 79  70 (!) 118  Resp: 10 11 13 13   Temp:      TempSrc:      SpO2: 98%  98%   Weight:      Height:       General: Not in acute distress HEENT:       Eyes: PERRL, EOMI, no jaundice       ENT: No discharge from the ears and nose, no pharynx injection, no tonsillar enlargement.        Neck: No JVD, no bruit, no mass felt. Heme: No neck lymph node  enlargement. Cardiac: S1/S2, RRR, No murmurs, No gallops or rubs. Respiratory: No rales, wheezing, rhonchi or rubs. GI: Soft, nondistended, has mild tenderness in lower abdomen, no rebound pain, no organomegaly, BS present. GU: No hematuria Ext: No pitting leg edema bilaterally. 1+DP/PT pulse bilaterally. Musculoskeletal: No joint deformities, No joint redness or warmth, no limitation of ROM in spin. Skin: No rashes.  Neuro: Alert, oriented X3, cranial nerves II-XII grossly intact, moves all extremities normally. Psych: Patient is not psychotic, no suicidal or hemocidal ideation.  Labs on Admission: I have personally reviewed following labs and imaging studies  CBC: Recent Labs  Lab 12/04/23 1035  WBC 4.7  HGB 12.3*  HCT 35.9*  MCV 96.2  PLT 202   Basic Metabolic Panel: Recent Labs  Lab 12/04/23 1035  NA 131*  K 4.6  CL 103  CO2 19*  GLUCOSE 137*  BUN 15  CREATININE 0.68  CALCIUM  9.7   GFR: Estimated Creatinine Clearance: 76.4 mL/min (by C-G formula based on SCr of 0.68 mg/dL). Liver Function Tests: Recent Labs  Lab 12/04/23 1035  AST 28  ALT 17  ALKPHOS 49  BILITOT 0.8  PROT 6.2*  ALBUMIN 3.3*   Recent Labs  Lab 12/04/23 1035  LIPASE 28   No results for input(s): AMMONIA in the last 168 hours. Coagulation Profile: No results for input(s): INR, PROTIME in the last 168 hours. Cardiac Enzymes: No results for input(s): CKTOTAL, CKMB, CKMBINDEX, TROPONINI in the last 168 hours. BNP (last 3 results) No results for input(s): PROBNP in the last 8760 hours. HbA1C: No results for input(s): HGBA1C in the last 72 hours. CBG: No results for input(s): GLUCAP in the last 168 hours. Lipid Profile: No results for input(s): CHOL, HDL, LDLCALC, TRIG, CHOLHDL, LDLDIRECT in the last 72 hours. Thyroid  Function Tests: No results for input(s): TSH, T4TOTAL, FREET4, T3FREE, THYROIDAB in the last 72 hours. Anemia Panel: No results  for input(s): VITAMINB12, FOLATE, FERRITIN, TIBC, IRON, RETICCTPCT in the last 72 hours. Urine analysis:    Component Value Date/Time   COLORURINE YELLOW (A) 12/04/2023 1035   APPEARANCEUR CLEAR (A) 12/04/2023 1035   APPEARANCEUR  Hazy 05/20/2011 2130   LABSPEC 1.014 12/04/2023 1035   LABSPEC 1.021 05/20/2011 2130   PHURINE 5.0 12/04/2023 1035   GLUCOSEU NEGATIVE 12/04/2023 1035   GLUCOSEU Negative 05/20/2011 2130   HGBUR NEGATIVE 12/04/2023 1035   BILIRUBINUR NEGATIVE 12/04/2023 1035   BILIRUBINUR Negative 05/20/2011 2130   KETONESUR NEGATIVE 12/04/2023 1035   PROTEINUR 30 (A) 12/04/2023 1035   NITRITE NEGATIVE 12/04/2023 1035   LEUKOCYTESUR TRACE (A) 12/04/2023 1035   LEUKOCYTESUR Negative 05/20/2011 2130   Sepsis Labs: @LABRCNTIP (procalcitonin:4,lacticidven:4) )No results found for this or any previous visit (from the past 240 hours).   Radiological Exams on Admission:   Assessment/Plan Principal Problem:   Abdominal pain Active Problems:   Diarrhea   Cancer of transverse colon (HCC)   HTN (hypertension)   UTI (urinary tract infection)   BPH (benign prostatic hyperplasia)   Obesity (BMI 30-39.9)   Assessment and Plan:   Abdominal pain: Patient complains of suprapubic abdominal tenderness.  UTI may have contributed partially. 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 EDP consulted Dr. Jordis of general surgery, will see the patient in AM.   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 Dolph recommendation  Diarrhea -Follow-up C. Difficile  Cancer of transverse colon [facility]): On chemotherapy. -Following up with Dr. Babara of oncology  HTN (hypertension) -IV hydralazine  as  needed - Amlodipine  - Switch Benicar to irbesartan  in hospital  UTI (urinary tract infection): Urinalysis showed trace amount of leukocyte and WBC 6-10.  Patient has a typical symptoms for UTI.  Also has suprapubic abdominal pain.  Patient likely has true UTI. 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  - Encourage losing weight - Exercise and healthy diet        DVT ppx: SQ Heparin    Code Status: DNR per pt, and his daughter and the wife  Family Communication:   Yes, patient's daughter and the wife at bed side.     Disposition Plan:  Anticipate discharge back to previous environment  Consults called:  Dr. Jordis of surgery is consulted.  Admission status and Level of care: Med-Surg:    for obs     Dispo: The patient is from: Home              Anticipated d/c is to: Home              Anticipated d/c date is: 1 day              Patient currently is not medically stable to d/c.    Severity of Illness:  The appropriate patient status for this patient is OBSERVATION. Observation status is judged to be reasonable and necessary in order to provide the required intensity of service to ensure the patient's safety. The patient's presenting symptoms, physical exam findings, and initial radiographic and laboratory data in the context of their medical condition is felt to place them at decreased risk for further clinical deterioration. Furthermore, it is anticipated that the patient will be medically stable for discharge from the hospital within 2 midnights of admission.  Date of Service 12/04/2023    Caleb Exon [facility]   If 7PM-7AM, please contact night-coverage [email or web address] 12/04/2023, 8:40 PM

## 2023-12-05 ENCOUNTER — Observation Stay

## 2023-12-05 DIAGNOSIS — R627 Adult failure to thrive: Secondary | ICD-10-CM | POA: Diagnosis present

## 2023-12-05 DIAGNOSIS — Z6830 Body mass index (BMI) 30.0-30.9, adult: Secondary | ICD-10-CM | POA: Diagnosis not present

## 2023-12-05 DIAGNOSIS — I10 Essential (primary) hypertension: Secondary | ICD-10-CM | POA: Diagnosis present

## 2023-12-05 DIAGNOSIS — R197 Diarrhea, unspecified: Secondary | ICD-10-CM | POA: Diagnosis not present

## 2023-12-05 DIAGNOSIS — Z79899 Other long term (current) drug therapy: Secondary | ICD-10-CM | POA: Diagnosis not present

## 2023-12-05 DIAGNOSIS — I700 Atherosclerosis of aorta: Secondary | ICD-10-CM | POA: Diagnosis present

## 2023-12-05 DIAGNOSIS — E871 Hypo-osmolality and hyponatremia: Secondary | ICD-10-CM | POA: Diagnosis present

## 2023-12-05 DIAGNOSIS — K5981 Ogilvie syndrome: Secondary | ICD-10-CM | POA: Diagnosis present

## 2023-12-05 DIAGNOSIS — R109 Unspecified abdominal pain: Secondary | ICD-10-CM | POA: Diagnosis not present

## 2023-12-05 DIAGNOSIS — Z825 Family history of asthma and other chronic lower respiratory diseases: Secondary | ICD-10-CM | POA: Diagnosis not present

## 2023-12-05 DIAGNOSIS — Z860101 Personal history of adenomatous and serrated colon polyps: Secondary | ICD-10-CM | POA: Diagnosis not present

## 2023-12-05 DIAGNOSIS — Z9049 Acquired absence of other specified parts of digestive tract: Secondary | ICD-10-CM | POA: Diagnosis not present

## 2023-12-05 DIAGNOSIS — N390 Urinary tract infection, site not specified: Secondary | ICD-10-CM | POA: Diagnosis present

## 2023-12-05 DIAGNOSIS — C184 Malignant neoplasm of transverse colon: Secondary | ICD-10-CM | POA: Diagnosis present

## 2023-12-05 DIAGNOSIS — Z803 Family history of malignant neoplasm of breast: Secondary | ICD-10-CM | POA: Diagnosis not present

## 2023-12-05 DIAGNOSIS — N3000 Acute cystitis without hematuria: Secondary | ICD-10-CM | POA: Diagnosis not present

## 2023-12-05 DIAGNOSIS — I452 Bifascicular block: Secondary | ICD-10-CM | POA: Diagnosis present

## 2023-12-05 DIAGNOSIS — R918 Other nonspecific abnormal finding of lung field: Secondary | ICD-10-CM | POA: Diagnosis present

## 2023-12-05 DIAGNOSIS — E66811 Obesity, class 1: Secondary | ICD-10-CM | POA: Diagnosis present

## 2023-12-05 DIAGNOSIS — N400 Enlarged prostate without lower urinary tract symptoms: Secondary | ICD-10-CM | POA: Diagnosis present

## 2023-12-05 DIAGNOSIS — Z7982 Long term (current) use of aspirin: Secondary | ICD-10-CM | POA: Diagnosis not present

## 2023-12-05 DIAGNOSIS — Z66 Do not resuscitate: Secondary | ICD-10-CM | POA: Diagnosis present

## 2023-12-05 DIAGNOSIS — E872 Acidosis, unspecified: Secondary | ICD-10-CM | POA: Diagnosis present

## 2023-12-05 DIAGNOSIS — K567 Ileus, unspecified: Secondary | ICD-10-CM | POA: Diagnosis present

## 2023-12-05 DIAGNOSIS — K56609 Unspecified intestinal obstruction, unspecified as to partial versus complete obstruction: Secondary | ICD-10-CM | POA: Diagnosis present

## 2023-12-05 LAB — BASIC METABOLIC PANEL WITH GFR
Anion gap: 4 — ABNORMAL LOW (ref 5–15)
BUN: 12 mg/dL (ref 8–23)
CO2: 20 mmol/L — ABNORMAL LOW (ref 22–32)
Calcium: 8.9 mg/dL (ref 8.9–10.3)
Chloride: 107 mmol/L (ref 98–111)
Creatinine, Ser: 0.43 mg/dL — ABNORMAL LOW (ref 0.61–1.24)
GFR, Estimated: >60 mL/min (ref >60)
Glucose, Bld: 91 mg/dL (ref 70–99)
Potassium: 3.8 mmol/L (ref 3.5–5.1)
Sodium: 131 mmol/L — ABNORMAL LOW (ref 135–145)

## 2023-12-05 LAB — CBC
HCT: 29.7 % — ABNORMAL LOW (ref 39.0–52.0)
Hemoglobin: 10.3 g/dL — ABNORMAL LOW (ref 13.0–17.0)
MCH: 32.2 pg (ref 26.0–34.0)
MCHC: 34.7 g/dL (ref 30.0–36.0)
MCV: 92.8 fL (ref 80.0–100.0)
Platelets: 153 K/uL (ref 150–400)
RBC: 3.2 MIL/uL — ABNORMAL LOW (ref 4.22–5.81)
RDW: 12.2 % (ref 11.5–15.5)
WBC: 4.1 K/uL (ref 4.0–10.5)
nRBC: 0 % (ref 0.0–0.2)

## 2023-12-05 MED ORDER — MIDODRINE HCL 5 MG PO TABS
10.0000 mg | ORAL_TABLET | ORAL | Status: AC
  Administered 2023-12-05: 10 mg via ORAL
  Filled 2023-12-05: qty 2

## 2023-12-05 MED ORDER — SODIUM CHLORIDE 0.9% FLUSH
10.0000 mL | Freq: Two times a day (BID) | INTRAVENOUS | Status: DC
  Administered 2023-12-05 – 2023-12-07 (×4): 10 mL

## 2023-12-05 MED ORDER — SODIUM CHLORIDE 0.9 % IV BOLUS
500.0000 mL | Freq: Once | INTRAVENOUS | Status: AC
  Administered 2023-12-05: 500 mL via INTRAVENOUS

## 2023-12-05 MED ORDER — CHLORHEXIDINE GLUCONATE CLOTH 2 % EX PADS
6.0000 | MEDICATED_PAD | Freq: Every day | CUTANEOUS | Status: DC
  Administered 2023-12-06 – 2023-12-07 (×2): 6 via TOPICAL

## 2023-12-05 MED ORDER — MORPHINE SULFATE (PF) 2 MG/ML IV SOLN
2.0000 mg | INTRAVENOUS | Status: DC | PRN
  Administered 2023-12-05: 2 mg via INTRAVENOUS
  Filled 2023-12-05: qty 1

## 2023-12-05 MED ORDER — IOHEXOL 300 MG/ML  SOLN
450.0000 mL | Freq: Once | INTRAMUSCULAR | Status: AC | PRN
  Administered 2023-12-05: 450 mL

## 2023-12-05 MED ORDER — SODIUM CHLORIDE 0.9% FLUSH
10.0000 mL | INTRAVENOUS | Status: DC | PRN
  Administered 2023-12-07: 10 mL

## 2023-12-05 NOTE — TOC CM/SW Note (Signed)
 Transition of Care [facility]) - Inpatient Brief Assessment   Patient Details  Name: Jared Gutierrez MRN: [id number] Date of Birth: September 10, 1940  Transition of Care [facility]) CM/SW Contact:    Asberry CHRISTELLA Jaksch, RN Phone Number: 12/05/2023, 10:06 AM   Clinical Narrative:  Transition of Care [facility]) Screening Note   Patient Details  Name: Jared Gutierrez Date of Birth: [birth year]   Transition of Care [facility]) CM/SW Contact:    Asberry CHRISTELLA Jaksch, RN Phone Number: 12/05/2023, 10:06 AM    Transition of Care Department [facility]) has reviewed patient and no TOC needs have been identified at this time. If new patient transition needs arise, please place a TOC consult.    Transition of Care Asessment: Insurance and Status: Insurance coverage has been reviewed Patient has primary care physician: Yes     Prior/Current Home Services: No current home services Social Drivers of Health Review: SDOH reviewed no interventions necessary Readmission risk has been reviewed: Yes Transition of care needs: no transition of care needs at this time

## 2023-12-05 NOTE — Progress Notes (Signed)
 PROGRESS NOTE    Jared Gutierrez  FMW:[id number] DOB: 04-Jul-1940 DOA: 12/04/2023 PCP: Sadie Manna, MD    Brief Narrative:  83 y.o. 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   Per patient and his daughter and wife at the bedside, patient has colon cancer on chemotherapy, currently is on chemo holiday in the past 4 weeks. Pt developed generalized weakness.  He has nausea, diarrhea and abdominal pain, no vomiting.  His abdominal pain is located in the lower abdomen, which is constant, mild, aching, nonradiating.  Not aggravated alleviated by any known factors.  He states that he has diarrhea 3-4 times each day.  Patient reports dysuria, burning with urination and urinary frequency.  No hematuria.  No chest pain, cough, SOB.  No fever or chills.   Data reviewed independently and ED Course: pt was found to have WBC 4.3, GFR> 60, UA (clear appearance, trace amount of leukocyte, negative bacteria, WBC 6-10), temperature normal, blood pressure 119/84, heart rate 65, RR 16, oxygen saturation 97% on room air.  Patient is placed in MedSurg bed for observation.  Dr. Jordis of surgery is consulted.  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  UTI may have contributed partially. 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 EDP consulted Dr. Jordis of general surgery,  Plan: Barium enema today General Surgery following Continue n.p.o. and IVF for  now Following general surgery recommendations Oncology made aware May need endoscopic evaluation   Diarrhea Doubt infectious but C. difficile pending.  Will follow-up   Cancer of transverse colon [facility]):  On chemotherapy. - Dr. Babara oncology consulted by surgery   HTN (hypertension) Continue amlodipine  and irbesartan    UTI (urinary tract infection):  Urinalysis showed trace amount of leukocyte and WBC 6-10.  Patient has a typical symptoms for UTI.  Also has suprapubic abdominal pain.  Patient likely has true UTI. Plan: IV Rocephin  Follow culture data   BPH (benign prostatic hyperplasia) Continue Flomax    Obesity (BMI 30-39.9):  Patient has Obesity Class I, with body weight  90.3 Kg and BMI 30.26 kg/m2.  Complicates overall care and prognosis    DVT prophylaxis: SQ heparin  Code Status: DNR Family Communication: Wife and daughter at bedside 7/23 Disposition Plan: Status is: Observation The patient will require care spanning > 2 midnights and should be moved to inpatient because: Ogilvie syndrome.  Abdominal pain.  Further workup in progress.     Level of care: Med-Surg  Consultants:  General Surgery Oncology  Procedures:  None  Antimicrobials: Ceftriaxone    Subjective: Seen and examined.  Resting in bed.  Wife and daughter at bedside.  Endorses abdominal pain and.  Endorses diarrhea.  Objective: Vitals:   12/04/23 2045 12/04/23 2137 12/05/23 0427 12/05/23 0914  BP:  (!) 152/88 (!) 140/89 111/85  Pulse: 66 66 64 (!) 58  Resp: 16 18 18    Temp:  98.1 F (36.7 C) 97.8 F (36.6 C) 98.4 F (36.9 C)  TempSrc:  Oral Oral   SpO2:  94% 100% 100% 100%  Weight:      Height:        Intake/Output Summary (Last 24 hours) at 12/05/2023 1309 Last data filed at 12/05/2023 0400 Gross per 24 hour  Intake 163.2 ml  Output --  Net 163.2 ml   Filed Weights   12/04/23 1032  Weight: 90.3 kg    Examination:  General exam: Appears calm and comfortable  Respiratory  system: Clear to auscultation. Respiratory effort normal. Cardiovascular system: S1-S2, RRR, no murmurs, no pedal edema Gastrointestinal system: Soft, nontender, nondistended, normal bowel sounds, Central nervous system: Alert and oriented. No focal neurological deficits. Extremities: Symmetric 5 x 5 power. Skin: No rashes, lesions or ulcers Psychiatry: Judgement and insight appear normal. Mood & affect appropriate.     Data Reviewed: I have personally reviewed following labs and imaging studies  CBC: Recent Labs  Lab 12/04/23 1035 12/05/23 0510  WBC 4.7 4.1  HGB 12.3* 10.3*  HCT 35.9* 29.7*  MCV 96.2 92.8  PLT 202 153   Basic Metabolic Panel: Recent Labs  Lab 12/04/23 1035 12/05/23 0510  NA 131* 131*  K 4.6 3.8  CL 103 107  CO2 19* 20*  GLUCOSE 137* 91  BUN 15 12  CREATININE 0.68 0.43*  CALCIUM  9.7 8.9   GFR: Estimated Creatinine Clearance: 76.4 mL/min (A) (by C-G formula based on SCr of 0.43 mg/dL (L)). Liver Function Tests: Recent Labs  Lab 12/04/23 1035  AST 28  ALT 17  ALKPHOS 49  BILITOT 0.8  PROT 6.2*  ALBUMIN 3.3*   Recent Labs  Lab 12/04/23 1035  LIPASE 28   No results for input(s): AMMONIA in the last 168 hours. Coagulation Profile: Recent Labs  Lab 12/04/23 2136  INR 1.2   Cardiac Enzymes: No results for input(s): CKTOTAL, CKMB, CKMBINDEX, TROPONINI in the last 168 hours. BNP (last 3 results) No results for input(s): PROBNP in the last 8760 hours. HbA1C: No results for input(s): HGBA1C in the last 72 hours. CBG: No results for input(s): GLUCAP in the last 168 hours. Lipid Profile: No results for input(s): CHOL, HDL, LDLCALC, TRIG, CHOLHDL, LDLDIRECT in the last 72 hours. Thyroid  Function Tests: No results for input(s): TSH, T4TOTAL, FREET4, T3FREE, THYROIDAB in the last 72 hours. Anemia Panel: No results for input(s): VITAMINB12, FOLATE, FERRITIN, TIBC, IRON, RETICCTPCT in the last  72 hours. Sepsis Labs: No results for input(s): PROCALCITON, LATICACIDVEN in the last 168 hours.  No results found for this or any previous visit (from the past 240 hours).       Radiology Studies: CT ABDOMEN PELVIS W CONTRAST Result Date: 12/04/2023 CLINICAL DATA:  Epigastric pain.  Colon cancer, on chemotherapy. EXAM: CT ABDOMEN AND PELVIS WITH CONTRAST TECHNIQUE: Multidetector CT imaging of the abdomen and pelvis was performed using the standard protocol following bolus administration of intravenous contrast. RADIATION DOSE REDUCTION: This exam was performed according to the departmental dose-optimization program which includes automated exposure control, adjustment of the mA and/or kV according to patient size and/or use of iterative reconstruction technique. CONTRAST:  OMNIPAQUE  IOHEXOL  300 MG/ML  SOLN COMPARISON:  CT 10/30/2023 FINDINGS: Lower chest: Mild atelectasis in the lung bases. No pleural fluid. Tip of the chest port in the right atrium. Hepatobiliary: No focal liver abnormality is seen. No gallstones, gallbladder wall thickening, or biliary dilatation. Pancreas: Soft tissue mass in the pancreas is unchanged or minimally decreased in size from prior exam, 2.5 x 2 cm, series 2, image 28. There is motion artifact through  this region which limits detailed assessment. Distal pancreatic ductal dilatation is again seen. There is no acute peripancreatic inflammation. Spleen: Normal in size without focal abnormality. Adrenals/Urinary Tract: No adrenal nodule. No hydronephrosis. There are bilateral renal cysts, including parapelvic cysts on the left, needing no further imaging follow-up. No visible renal calculi. Partially distended urinary bladder without wall thickening. Stomach/Bowel: Diffuse gaseous colonic distension with colonic redundancy. There is narrowing in the sigmoid colon spanning 6.2 cm. This colonic segment is nondistended and not well assessed. Normal appendix is  visualized in the mid abdomen. Air within mildly prominent but not abnormally distended small bowel. Small amount of air and fluid within the stomach which is mildly distended. There is no bowel inflammation. Vascular/Lymphatic: Aortic and branch atherosclerosis. 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 Portal and splenic veins are patent. The superior mesenteric vein is narrowed just before the mesenteric portal confluence, also present on prior exam. Soft tissue density at the root of the hepatic artery is similar, series 2, image 25, motion artifact limitations on the current exam. Reproductive: Prostatomegaly, 5 cm transverse. Other: No free air. No ascites. No convincing peritoneal thickening or nodularity. No abdominal wall hernia. Multiple surgical clips in the left abdomen/retroperitoneum. Musculoskeletal: No focal bone lesion or acute osseous findings. Degenerative change of the hips, spine, and sacroiliac joints. IMPRESSION: 1. Diffuse gaseous colonic distension with colonic redundancy. There is narrowing in the sigmoid colon spanning 6.2 cm. This colonic segment is nondistended and not well assessed. 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 2. Soft tissue mass in the pancreas is unchanged or minimally decreased in size from prior exam. There is motion artifact through this region which limits detailed assessment. Soft tissue density at the root of the hepatic artery is similar. 3. Additional chronic findings as described. Aortic Atherosclerosis (ICD10-I70.0). Electronically Signed   By: Andrea Gasman M.D.   On: 12/04/2023 18:20        Scheduled Meds:  amLODipine   2.5 mg Oral Daily   aspirin  EC  81 mg Oral Daily   Chlorhexidine  Gluconate Cloth  6 each Topical Daily   heparin   5,000 Units Subcutaneous Q8H   irbesartan   150 mg Oral Daily   pantoprazole   40 mg Oral Q1200   sodium chloride  flush  10-40 mL Intracatheter Q12H   tamsulosin   0.4 mg Oral QPC  supper   Continuous Infusions:  sodium chloride  75 mL/hr at 12/04/23 2007   cefTRIAXone  (ROCEPHIN )  IV 2 g (12/04/23 2321)     LOS: 0 days     Calvin KATHEE Robson, MD [facility]   If 7PM-7AM, please contact night-coverage  12/05/2023, 1:09 PM

## 2023-12-05 NOTE — Plan of Care (Signed)

## 2023-12-05 NOTE — Consult Note (Signed)
 Patient ID: Jared Gutierrez, male   DOB: 01-Jun-1940, 83 y.o.   MRN: [id number]  HPI Jared Gutierrez is a 83 y.o. male seen in consultation at the request of Dr. Willo.  He does have significant history for transverse colon cancer prior colectomy several years ago and undergoing chemotherapy.  He has not been on chemotherapy for over a month.  More recently presented with a day history of abdominal pain and dull nonspecific alleviating or grading factors.  He also had dysuria and pain with urination.  He also reports decreased appetite failure to thrive and multiple watery bowel movements.  He continues to have nausea and diarrhea.  Did have a CT scan that have personally reviewed showing evidence of some dilation of the colon questionable Ogilvie's.  He also had a PET/CT last year showing some residual nodal metastasis.  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 Cbc and cmp nml expect hyponatremia and acidosis   HPI  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   Chicken pox    Colon cancer (HCC)    History of kidney stones    Hypertension    Lipoma of colon    Nephrolithiasis    Nephrolithiasis    Obesity    Shingles    Tubular adenoma of colon    multiple fragments    Past Surgical History:  Procedure Laterality Date   COLON SURGERY     COLONOSCOPY N/A 10/02/2014   Procedure: COLONOSCOPY;  Surgeon: Donnice Vaughn Manes, MD;  Location: [facility] ENDOSCOPY;  Service: Endoscopy;  Laterality: N/A;   COLONOSCOPY WITH PROPOFOL  N/A 02/16/2017   Procedure: COLONOSCOPY WITH PROPOFOL ;  Surgeon: Viktoria Lamar DASEN, MD;  Location: [facility] ENDOSCOPY;  Service: Endoscopy;  Laterality: N/A;   COLONOSCOPY WITH PROPOFOL  N/A 10/05/2020   Procedure: COLONOSCOPY WITH PROPOFOL ;  Surgeon: Maryruth Ole DASEN, MD;  Location: [facility] ENDOSCOPY;  Service: Endoscopy;  Laterality: N/A;   EUS N/A 04/17/2019    Procedure: FULL UPPER ENDOSCOPIC ULTRASOUND (EUS) RADIAL;  Surgeon: Queenie Asberry LABOR, MD;  Location: [facility] ENDOSCOPY;  Service: Gastroenterology;  Laterality: N/A;   KIDNEY STONE SURGERY     PARTIAL COLECTOMY  10/17/2013   PORTACATH PLACEMENT Right 06/13/2019   Procedure: INSERTION PORT-A-CATH;  Surgeon: Tye Millet, DO;  Location: [facility] ORS;  Service: General;  Laterality: Right;    Family History  Problem Relation Age of Onset   Cancer Mother    Breast cancer Mother    COPD Father     Social History Social History   Tobacco Use   Smoking status: Never   Smokeless tobacco: Never  Vaping Use   Vaping status: Never Used  Substance Use Topics   Alcohol use: Yes    Comment: socially    Drug use: No    No Known Allergies  Current Facility-Administered Medications  Medication Dose Route Frequency Provider Last Rate Last Admin   0.9 %  sodium chloride  infusion   Intravenous Continuous Niu, Xilin, MD 75 mL/hr at 12/04/23 2007 New Bag at 12/04/23 2007   acetaminophen  (TYLENOL ) tablet 650 mg  650 mg Oral Q6H PRN Niu, Xilin, MD       amLODipine  (NORVASC ) tablet 2.5 mg  2.5 mg Oral Daily Niu, Xilin, MD       aspirin  EC tablet 81 mg  81 mg Oral Daily Niu, Xilin, MD  cefTRIAXone  (ROCEPHIN ) 2 g in sodium chloride  0.9 % 100 mL IVPB  2 g Intravenous Q24H Niu, Xilin, MD 200 mL/hr at 12/04/23 2321 2 g at 12/04/23 2321   Chlorhexidine  Gluconate Cloth 2 % PADS 6 each  6 each Topical Daily Niu, Xilin, MD       heparin  injection 5,000 Units  5,000 Units Subcutaneous Q8H Niu, Xilin, MD   5,000 Units at 12/05/23 9490   hydrALAZINE  (APRESOLINE ) injection 5 mg  5 mg Intravenous Q2H PRN Niu, Xilin, MD       irbesartan  (AVAPRO ) tablet 150 mg  150 mg Oral Daily Niu, Xilin, MD       ondansetron  (ZOFRAN ) injection 4 mg  4 mg Intravenous Q8H PRN Niu, Xilin, MD       pantoprazole  (PROTONIX ) EC tablet 40 mg  40 mg Oral Q1200 Niu, Xilin, MD       sodium chloride  flush (NS) 0.9 % injection 10-40 mL   10-40 mL Intracatheter Q12H Niu, Xilin, MD       sodium chloride  flush (NS) 0.9 % injection 10-40 mL  10-40 mL Intracatheter PRN Niu, Xilin, MD       tamsulosin  (FLOMAX ) capsule 0.4 mg  0.4 mg Oral QPC supper Niu, Xilin, MD       Facility-Administered Medications Ordered in Other Encounters  Medication Dose Route Frequency Provider Last Rate Last Admin   0.9 %  sodium chloride  infusion   Intravenous Once Corcoran, Melissa C, MD       0.9 %  sodium chloride  infusion   Intravenous Continuous Corcoran, Melissa C, MD 10 mL/hr at 12/31/19 1000 New Bag at 10/05/20 1045   heparin  lock flush 100 unit/mL  500 Units Intravenous Once Rudell Eleanor BROCKS, MD       sodium chloride  flush (NS) 0.9 % injection 10 mL  10 mL Intracatheter PRN Babara Call, MD   10 mL at 02/01/23 1341     Review of Systems Full ROS  was asked and was negative except for the information on the HPI  Physical Exam Blood pressure 111/85, pulse (!) 58, temperature 98.4 F (36.9 C), resp. rate 18, height 5' 8 (1.727 m), weight 90.3 kg, SpO2 100%. CONSTITUTIONAL: Chronically ill but in no acute distress. EYES: Pupils are equal, round, Sclera are non-icteric. EARS, NOSE, MOUTH AND THROAT: The oropharynx is clear. The oral mucosa is pink and moist. Hearing is intact to voice. LYMPH NODES:  Lymph nodes in the neck are normal. RESPIRATORY:  Lungs are clear. There is normal respiratory effort, with equal breath sounds bilaterally, and without pathologic use of accessory muscles. CARDIOVASCULAR: Heart is regular without murmurs, gallops, or rubs. GI: The abdomen is  soft, nontender, and nondistended. There are no palpable masses. There is no hepatosplenomegaly.  There is no peritonitis ,there are normal bowel sounds  Prior midline laparotomy scar GU: Rectal deferred.   MUSCULOSKELETAL: Normal muscle strength and tone. No cyanosis or edema.   SKIN: Turgor is good and there are no pathologic skin lesions or ulcers. NEUROLOGIC: Motor and  sensation is grossly normal. Cranial nerves are grossly intact. PSYCH:  Oriented to person, place and time. Affect is normal.  Data Reviewed I have personally reviewed the patient's imaging, laboratory findings and medical records.    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  Clinically no evidence of obstruction.  Out of abundance of caution is  we will obtain a barium enema to rule out any obstructive process within the sigmoid or left colon. I will continue to follow him.  He may need flex sig as well. I will be wise to involve oncology as well. No need for urgent surgical intervention at this time I personally spent a total of 75 minutes in the care of the patient today including performing a medically appropriate exam/evaluation, counseling and educating, placing orders, referring and communicating with other health care professionals, documenting clinical information in the EHR, independently interpreting and reviewing images studies and coordinating care.    Laneta Luna, MD FACS General Surgeon 12/05/2023, 9:32 AM

## 2023-12-05 NOTE — Consult Note (Signed)
 Hematology/Oncology Consult note [phone] [phone]      Patient Care Team: Sadie Manna, MD as PCP - General (Internal Medicine) Darliss Rogue, MD as PCP - Cardiology (Cardiology) Sadie Manna, MD (Internal Medicine) Jeri Donnice Burkes, MD as Referring Physician (Gastroenterology) Maurie Rayfield BIRCH, RN as Oncology Nurse Navigator Babara Call, MD as Consulting Physician (Oncology) Lenn Aran, MD as Consulting Physician (Radiation Oncology)   Name of the patient: Jared Gutierrez  [id number]  1940-11-26   REASON FOR COSULTATION:  Colon cancer History of presenting illness-  83 y.o. 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 Patient has diarrhea for the past week 3-4 times of loose bowel movement per day.  Abdominal pain is located lower abdomen.  No nausea vomiting appetite has been decreased.  Patient has increased urinary frequency, burning sensation during urination.  In the ER, workup showed urinalysis positive for trace amount of leukocytes Urine culture is pending.  12/04/2023 CT abdomen pelvis with contrast showed 1. Diffuse gaseous colonic distension with colonic redundancy. There is narrowing in the sigmoid colon spanning 6.2 cm. This colonic segment is nondistended and not well assessed. 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 2. Soft tissue mass in the pancreas is unchanged or minimally decreased in size from prior exam. There is motion artifact through this region which limits detailed assessment. Soft tissue density at the root of the hepatic artery is similar. 3. Additional chronic findings as described.       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    Priority: High   Hyponatremia 08/14/2023    Priority: Medium    Lung nodules 01/16/2023    Priority: Medium    Anemia due to chemotherapy 12/02/2020    Priority: Medium    Encounter for antineoplastic chemotherapy 06/25/2019    Priority: Medium    GERD (gastroesophageal reflux disease) 09/18/2023    Priority: Low   UTI (urinary tract infection) 08/28/2023    Priority: Low   Bilateral leg edema 01/30/2023    Priority: Low   Weight loss 12/19/2022    Priority: Low   Subconjunctival hemorrhage 09/12/2022    Priority: Low   Epistaxis 07/04/2022    Priority: Low   Elevated PSA 01/03/2022    Priority: Low   Constipation 10/26/2021    Priority: Low   Chemotherapy induced diarrhea 09/28/2021    Priority: Low   Hypotension due to hypovolemia 05/18/2021    Priority: Low   Port-A-Cath in place 03/08/2021    Priority: Low   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    Priority: Low   Hypokalemia 07/23/2019    Priority: Low   Goals of care, counseling/discussion 06/25/2019    Priority: Low   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    Priority: Low   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   Abdominal pain 12/04/2023   HTN (hypertension) 12/04/2023   Obesity (BMI 30-39.9) 12/04/2023   Diarrhea 12/04/2023   BPH (benign prostatic hyperplasia) 03/28/2022   Need for prophylactic vaccination and inoculation against influenza 03/28/2022   Aortic atherosclerosis (HCC) 08/02/2020   Hepatic steatosis 08/02/2020   Chemotherapy-induced neutropenia (HCC) 12/20/2019   Cellulitis of left lower extremity 11/03/2019   Pseudogout of foot, left 11/03/2019  Hypomagnesemia 10/29/2019   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   Sepsis (HCC) 08/16/2019   Cellulitis of right lower extremity 08/16/2019   Maintenance chemotherapy 08/16/2019   Lesion of right lung 07/01/2019   Nephrolithiasis 07/01/2019   Osteoarthritis 07/01/2019   Morbid obesity  with BMI of 40.0-44.9, adult (HCC) 01/27/2019   Elevated CEA 12/24/2018   Hypertension 05/10/2016   Primary osteoarthritis of left knee 02/05/2014   History of colon cancer, stage I 10/17/2013     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   Chicken pox    Colon cancer (HCC)    History of kidney stones    Hypertension    Lipoma of colon    Nephrolithiasis    Nephrolithiasis    Obesity    Shingles    Tubular adenoma of colon    multiple fragments     Past Surgical History:  Procedure Laterality Date   COLON SURGERY     COLONOSCOPY N/A 10/02/2014   Procedure: COLONOSCOPY;  Surgeon: Donnice Vaughn Manes, MD;  Location: [facility] ENDOSCOPY;  Service: Endoscopy;  Laterality: N/A;   COLONOSCOPY WITH PROPOFOL  N/A 02/16/2017   Procedure: COLONOSCOPY WITH PROPOFOL ;  Surgeon: Viktoria Lamar DASEN, MD;  Location: [facility] ENDOSCOPY;  Service: Endoscopy;  Laterality: N/A;   COLONOSCOPY WITH PROPOFOL  N/A 10/05/2020   Procedure: COLONOSCOPY WITH PROPOFOL ;  Surgeon: Maryruth Ole DASEN, MD;  Location: [facility] ENDOSCOPY;  Service: Endoscopy;  Laterality: N/A;   EUS N/A 04/17/2019   Procedure: FULL UPPER ENDOSCOPIC ULTRASOUND (EUS) RADIAL;  Surgeon: Queenie Asberry LABOR, MD;  Location: [facility] ENDOSCOPY;  Service: Gastroenterology;  Laterality: N/A;   KIDNEY STONE SURGERY     PARTIAL COLECTOMY  10/17/2013   PORTACATH PLACEMENT Right 06/13/2019   Procedure: INSERTION PORT-A-CATH;  Surgeon: Tye Millet, DO;  Location: [facility] ORS;  Service: General;  Laterality: Right;    Social History   Socioeconomic History   Marital status: Married    Spouse name: Not on file   Number of children: Not on file   Years of education: Not on file   Highest education level: Not on file  Occupational History   Not on file  Tobacco Use   Smoking status: Never   Smokeless tobacco: Never  Vaping Use   Vaping status: Never Used   Substance and Sexual Activity   Alcohol use: Yes    Comment: socially    Drug use: No   Sexual activity: Not on file  Other Topics Concern   Not on file  Social History Narrative   Not on file   Social Drivers of Health   Financial Resource Strain: Not on file  Food Insecurity: No Food Insecurity (12/04/2023)   Hunger Vital Sign    Worried About Running Out of Food in the Last Year: Never true    Ran Out of Food in the Last Year: Never true  Transportation Needs: No Transportation Needs (12/04/2023)   PRAPARE - Administrator, Civil Service (Medical): No    Lack of Transportation (Non-Medical): No  Physical Activity: Not on file  Stress: Not on file  Social Connections: Socially Integrated (12/04/2023)   Social Connection and Isolation Panel    Frequency of Communication with Friends and Family: More than three times a week    Frequency of Social Gatherings with Friends and Family: More than three times a week  Attends Religious Services: 1 to 4 times per year    Active Member of Clubs or Organizations: No    Attends Banker Meetings: 1 to 4 times per year    Marital Status: Married  Catering manager Violence: Not At Risk (12/04/2023)   Humiliation, Afraid, Rape, and Kick questionnaire    Fear of Current or Ex-Partner: No    Emotionally Abused: No    Physically Abused: No    Sexually Abused: No     Family History  Problem Relation Age of Onset   Cancer Mother    Breast cancer Mother    COPD Father      Current Facility-Administered Medications:    0.9 %  sodium chloride  infusion, , Intravenous, Continuous, Niu, Xilin, MD, Last Rate: 75 mL/hr at 12/04/23 2007, New Bag at 12/04/23 2007   acetaminophen  (TYLENOL ) tablet 650 mg, 650 mg, Oral, Q6H PRN, Niu, Xilin, MD   amLODipine  (NORVASC ) tablet 2.5 mg, 2.5 mg, Oral, Daily, Niu, Xilin, MD, 2.5 mg at 12/05/23 1010   aspirin  EC tablet 81 mg, 81 mg, Oral, Daily, Niu, Xilin, MD, 81 mg at 12/05/23  1011   cefTRIAXone  (ROCEPHIN ) 2 g in sodium chloride  0.9 % 100 mL IVPB, 2 g, Intravenous, Q24H, Niu, Xilin, MD, Last Rate: 200 mL/hr at 12/04/23 2321, 2 g at 12/04/23 2321   Chlorhexidine  Gluconate Cloth 2 % PADS 6 each, 6 each, Topical, Daily, Niu, Xilin, MD   heparin  injection 5,000 Units, 5,000 Units, Subcutaneous, Q8H, Niu, Xilin, MD, 5,000 Units at 12/05/23 9490   hydrALAZINE  (APRESOLINE ) injection 5 mg, 5 mg, Intravenous, Q2H PRN, Niu, Xilin, MD   irbesartan  (AVAPRO ) tablet 150 mg, 150 mg, Oral, Daily, Niu, Xilin, MD, 150 mg at 12/05/23 1010   ondansetron  (ZOFRAN ) injection 4 mg, 4 mg, Intravenous, Q8H PRN, Niu, Xilin, MD   pantoprazole  (PROTONIX ) EC tablet 40 mg, 40 mg, Oral, Q1200, Niu, Xilin, MD, 40 mg at 12/05/23 1421   sodium chloride  flush (NS) 0.9 % injection 10-40 mL, 10-40 mL, Intracatheter, Q12H, Niu, Xilin, MD, 10 mL at 12/05/23 1421   sodium chloride  flush (NS) 0.9 % injection 10-40 mL, 10-40 mL, Intracatheter, PRN, Niu, Xilin, MD   tamsulosin  (FLOMAX ) capsule 0.4 mg, 0.4 mg, Oral, QPC supper, Niu, Xilin, MD  Facility-Administered Medications Ordered in Other Encounters:    0.9 %  sodium chloride  infusion, , Intravenous, Once, Corcoran, Melissa C, MD   0.9 %  sodium chloride  infusion, , Intravenous, Continuous, Corcoran, Melissa C, MD, Last Rate: 10 mL/hr at 12/31/19 1000, New Bag at 10/05/20 1045   heparin  lock flush 100 unit/mL, 500 Units, Intravenous, Once, Corcoran, Melissa C, MD   sodium chloride  flush (NS) 0.9 % injection 10 mL, 10 mL, Intracatheter, PRN, Babara Call, MD, 10 mL at 02/01/23 1341  Review of Systems  Constitutional:  Positive for appetite change and fatigue. Negative for chills, fever and unexpected weight change.  HENT:   Negative for hearing loss and voice change.   Eyes:  Negative for eye problems and icterus.  Respiratory:  Negative for chest tightness, cough and shortness of breath.   Cardiovascular:  Negative for chest pain and leg swelling.   Gastrointestinal:  Positive for abdominal pain. Negative for abdominal distention.  Endocrine: Negative for hot flashes.  Genitourinary:  Positive for dysuria and frequency. Negative for difficulty urinating.   Musculoskeletal:  Negative for arthralgias.  Skin:  Negative for itching and rash.  Neurological:  Negative for light-headedness and numbness.  Hematological:  Negative for adenopathy. Does not bruise/bleed easily.  Psychiatric/Behavioral:  Negative for confusion.     PHYSICAL EXAM Vitals:   12/04/23 2137 12/05/23 0427 12/05/23 0914 12/05/23 1421  BP: (!) 152/88 (!) 140/89 111/85 (!) 148/87  Pulse: 66 64 (!) 58 (!) 58  Resp: 18 18    Temp: 98.1 F (36.7 C) 97.8 F (36.6 C) 98.4 F (36.9 C) 98.1 F (36.7 C)  TempSrc: Oral Oral  Oral  SpO2: 100% 100% 100% 98%  Weight:      Height:       Physical Exam Constitutional:      Appearance: He is not diaphoretic.  HENT:     Head: Normocephalic and atraumatic.  Eyes:     General: No scleral icterus. Cardiovascular:     Rate and Rhythm: Normal rate and regular rhythm.     Heart sounds: No murmur heard. Pulmonary:     Effort: Pulmonary effort is normal. No respiratory distress.     Breath sounds: Normal breath sounds. No wheezing.  Abdominal:     General: There is no distension.     Palpations: Abdomen is soft.     Tenderness: There is no abdominal tenderness.  Musculoskeletal:        General: Normal range of motion.     Cervical back: Normal range of motion and neck supple.  Skin:    General: Skin is warm and dry.     Findings: No erythema.  Neurological:     Mental Status: He is alert and oriented to person, place, and time. Mental status is at baseline.     Motor: No abnormal muscle tone.  Psychiatric:        Mood and Affect: Mood and affect normal.       LABORATORY STUDIES    Latest Ref Rng & Units 12/05/2023    5:10 AM 12/04/2023   10:35 AM 11/06/2023    8:32 AM  CBC  WBC 4.0 - 10.5 K/uL 4.1  4.7  4.4    Hemoglobin 13.0 - 17.0 g/dL 89.6  87.6  89.7   Hematocrit 39.0 - 52.0 % 29.7  35.9  29.7   Platelets 150 - 400 K/uL 153  202  176       Latest Ref Rng & Units 12/05/2023    5:10 AM 12/04/2023   10:35 AM 11/06/2023    8:32 AM  CMP  Glucose 70 - 99 mg/dL 91  862  849   BUN 8 - 23 mg/dL 12  15  13    Creatinine 0.61 - 1.24 mg/dL 9.56  9.31  9.43   Sodium 135 - 145 mmol/L 131  131  134   Potassium 3.5 - 5.1 mmol/L 3.8  4.6  3.5   Chloride 98 - 111 mmol/L 107  103  109   CO2 22 - 32 mmol/L 20  19  20    Calcium  8.9 - 10.3 mg/dL 8.9  9.7  8.6   Total Protein 6.5 - 8.1 g/dL  6.2  5.3   Total Bilirubin 0.0 - 1.2 mg/dL  0.8  0.8   Alkaline Phos 38 - 126 U/L  49  42   AST 15 - 41 U/L  28  23   ALT 0 - 44 U/L  17  15      RADIOGRAPHIC STUDIES: I have personally reviewed the radiological images as listed and agreed with the findings in the report. DG BE (COLON)W SINGLE CM (SOL OR THIN BA) Result Date: 12/05/2023  CLINICAL DATA:  83 year old male with a history of transverse colon cancer s/p colectomy several years ago. Worsening abdominal pain. Imaging showing diffuse gaseous colonic distention concerning for colonic ileus versus obstruction. EXAM: SINGLE CONTRAST BARIUM ENEMA TECHNIQUE: After obtaining a scout radiograph, a single column contrast enema was performed using Omnipaque  300 diluted with water. This exam was performed by [facility] PA-C, and was supervised and interpreted by Dr. Norleen Kil. FLUOROSCOPY: 14.9 mGy COMPARISON:  NONE. FINDINGS: Scout radiographs show mild gaseous distension of colon, suspicious for ileus. Multiple surgical staples are seen in the left abdomen. Small stool burden noted. Retrograde filling of the colon was achieved to the level of the cecum. No evidence of colonic obstruction. No evidence of colonic obstruction or annular constricting lesions. Residual stool mainly in the right colon is noted, which could obscure an intraluminal polyp or mass. No  evidence of diverticular disease or fistula. IMPRESSION: No  evidence of colonic obstruction or annular constricting lesion. Mild gaseous distension of colon, suspicious for ileus. Small stool burden noted, mainly in the right colon. Electronically Signed   By: Norleen DELENA Kil M.D.   On: 12/05/2023 15:09   CT ABDOMEN PELVIS W CONTRAST Result Date: 12/04/2023 CLINICAL DATA:  Epigastric pain.  Colon cancer, on chemotherapy. EXAM: CT ABDOMEN AND PELVIS WITH CONTRAST TECHNIQUE: Multidetector CT imaging of the abdomen and pelvis was performed using the standard protocol following bolus administration of intravenous contrast. RADIATION DOSE REDUCTION: This exam was performed according to the departmental dose-optimization program which includes automated exposure control, adjustment of the mA and/or kV according to patient size and/or use of iterative reconstruction technique. CONTRAST:  OMNIPAQUE  IOHEXOL  300 MG/ML  SOLN COMPARISON:  CT 10/30/2023 FINDINGS: Lower chest: Mild atelectasis in the lung bases. No pleural fluid. Tip of the chest port in the right atrium. Hepatobiliary: No focal liver abnormality is seen. No gallstones, gallbladder wall thickening, or biliary dilatation. Pancreas: Soft tissue mass in the pancreas is unchanged or minimally decreased in size from prior exam, 2.5 x 2 cm, series 2, image 28. There is motion artifact through this region which limits detailed assessment. Distal pancreatic ductal dilatation is again seen. There is no acute peripancreatic inflammation. Spleen: Normal in size without focal abnormality. Adrenals/Urinary Tract: No adrenal nodule. No hydronephrosis. There are bilateral renal cysts, including parapelvic cysts on the left, needing no further imaging follow-up. No visible renal calculi. Partially distended urinary bladder without wall thickening. Stomach/Bowel: Diffuse gaseous colonic distension with colonic redundancy. There is narrowing in the sigmoid colon spanning  6.2 cm. This colonic segment is nondistended and not well assessed. Normal appendix is visualized in the mid abdomen. Air within mildly prominent but not abnormally distended small bowel. Small amount of air and fluid within the stomach which is mildly distended. There is no bowel inflammation. Vascular/Lymphatic: Aortic and branch atherosclerosis. 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 Portal and splenic veins are patent. The superior mesenteric vein is narrowed just before the mesenteric portal confluence, also present on prior exam. Soft tissue density at the root of the hepatic artery is similar, series 2, image 25, motion artifact limitations on the current exam. Reproductive: Prostatomegaly, 5 cm transverse. Other: No free air. No ascites. No convincing peritoneal thickening or nodularity. No abdominal wall hernia. Multiple surgical clips in the left abdomen/retroperitoneum. Musculoskeletal: No focal bone lesion or acute osseous findings. Degenerative change of the hips, spine, and sacroiliac joints. IMPRESSION: 1. Diffuse gaseous colonic distension with colonic redundancy.  There is narrowing in the sigmoid colon spanning 6.2 cm. This colonic segment is nondistended and not well assessed. 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 2. Soft tissue mass in the pancreas is unchanged or minimally decreased in size from prior exam. There is motion artifact through this region which limits detailed assessment. Soft tissue density at the root of the hepatic artery is similar. 3. Additional chronic findings as described. Aortic Atherosclerosis (ICD10-I70.0). Electronically Signed   By: Andrea Gasman M.D.   On: 12/04/2023 18:20   CT CHEST ABDOMEN PELVIS W CONTRAST Result Date: 10/30/2023 CLINICAL DATA:  Restaging of colon cancer with peritoneal metastasis. Decreased appetite. Kidney stones. Ex-smoker. On chemotherapy. * Tracking Code: BO * EXAM: CT CHEST, ABDOMEN, AND PELVIS WITH  CONTRAST TECHNIQUE: Multidetector CT imaging of the chest, abdomen and pelvis was performed following the standard protocol during bolus administration of intravenous contrast. RADIATION DOSE REDUCTION: This exam was performed according to the departmental dose-optimization program which includes automated exposure control, adjustment of the mA and/or kV according to patient size and/or use of iterative reconstruction technique. CONTRAST:  OMNIPAQUE  IOHEXOL  300 MG/ML  SOLN COMPARISON:  08/06/2023 FINDINGS: CT CHEST FINDINGS Cardiovascular: Port-A-Cath tip mid right atrium. Aortic atherosclerosis. 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 Normal heart size, without pericardial effusion. Lad coronary artery calcification. No central pulmonary embolism, on this non-dedicated study. Mediastinum/Nodes: No supraclavicular adenopathy. No mediastinal or hilar adenopathy. Lungs/Pleura: No pleural fluid. Basilar predominant cystic bronchiectasis is likely post infectious/inflammatory. 3-4 mm inferior right upper lobe pulmonary nodule is unchanged on 59/3. The left upper lobe nodule on the prior exam has resolved. Musculoskeletal: Included within the abdomen pelvic section. CT ABDOMEN PELVIS FINDINGS Hepatobiliary: Normal liver. Normal gallbladder, without biliary ductal dilatation. Pancreas: Soft tissue mass centered about the pancreatic neck measures 2.7 x 2.5 cm on 54/2 and is similar to 2.7 x 2.8 cm on the prior. No peripancreatic edema. Spleen: Normal in size, without focal abnormality. Adrenals/Urinary Tract: Normal adrenal glands. Bilateral renal cysts of up to 3.1 cm do not warrant imaging follow-up. No hydronephrosis. The bladder wall appears thickened, but is underdistended. Mild pericystic edema is similar. Stomach/Bowel: Normal stomach, without wall thickening. Normal colon, appendix, and terminal ileum. Normal small bowel. Vascular/Lymphatic: Aortic atherosclerosis. Right common  iliac artery ectasia at 1.9 cm is unchanged. Abnormal soft tissue again identified about the proximal hepatic artery including on 52/2, likely similar. Infiltrative and difficult to directly measure. Reproductive: Mild prostatomegaly. Other: No significant free fluid.  No free intraperitoneal air. Musculoskeletal: Degenerative partial fusion of the bilateral sacroiliac joints. IMPRESSION: 1. Relatively similar appearance of mass centered in the pancreatic neck and adjacent infiltrative soft tissue about the common hepatic artery. Given clinical history, this all presumably represents metastatic disease. Pancreatic adenocarcinoma could look similar. 2. No new sites of disease identified. 3. 3-4 mm inferior right upper lobe pulmonary nodule is unchanged. 4. Prostatomegaly with mild bladder wall thickening and chronic pericystic edema, suggesting a component of outlet obstruction. 5. Incidental findings, including: Coronary artery atherosclerosis. Aortic Atherosclerosis (ICD10-I70.0). Similar right common iliac artery ectasia. Electronically Signed   By: Rockey Kilts M.D.   On: 10/30/2023 15:19     Assessment and plan-   # Abdominal pain, possibly secondary to UTI and questionable bowel obstruction. Discussed with Dr. Jordis who feels that patient may have pseudo obstruction and there is plan for enema.  N.p.o. and IV fluid for hydration.  # UTI, empirically covered with antibiotics.  Follow-up  with urine culture. # Recurrent colon cancer with small tissue mass close to pancreas.  Size is unchanged.  As previously discussed with IR and this soft tissue mass is not amenable for biopsy.  Shared decision was made to hold off biopsy and continue monitor.  He has been doing well and is on chemotherapy holiday for the past 4 weeks.  No other known site of metastasis.  Thank you for allowing me to participate in the care of this patient.   Zelphia Cap, MD, PhD Hematology Oncology 12/05/2023

## 2023-12-05 NOTE — Care Management Obs Status (Signed)
 MEDICARE OBSERVATION STATUS NOTIFICATION   Patient Details  Name: Jared Gutierrez MRN: [id number] Date of Birth: 06/08/1940   Medicare Observation Status Notification Given:  Yes    Rojelio SHAUNNA Rattler 12/05/2023, 11:33 AM

## 2023-12-06 DIAGNOSIS — K5981 Ogilvie syndrome: Secondary | ICD-10-CM | POA: Diagnosis not present

## 2023-12-06 DIAGNOSIS — R109 Unspecified abdominal pain: Secondary | ICD-10-CM | POA: Diagnosis not present

## 2023-12-06 DIAGNOSIS — C184 Malignant neoplasm of transverse colon: Secondary | ICD-10-CM | POA: Diagnosis not present

## 2023-12-06 LAB — BASIC METABOLIC PANEL WITH GFR
Anion gap: 10 (ref 5–15)
BUN: 10 mg/dL (ref 8–23)
CO2: 17 mmol/L — ABNORMAL LOW (ref 22–32)
Calcium: 8.4 mg/dL — ABNORMAL LOW (ref 8.9–10.3)
Chloride: 106 mmol/L (ref 98–111)
Creatinine, Ser: 0.82 mg/dL (ref 0.61–1.24)
GFR, Estimated: >60 mL/min (ref >60)
Glucose, Bld: 120 mg/dL — ABNORMAL HIGH (ref 70–99)
Potassium: 3.4 mmol/L — ABNORMAL LOW (ref 3.5–5.1)
Sodium: 133 mmol/L — ABNORMAL LOW (ref 135–145)

## 2023-12-06 LAB — CBC
HCT: 26.9 % — ABNORMAL LOW (ref 39.0–52.0)
Hemoglobin: 9.4 g/dL — ABNORMAL LOW (ref 13.0–17.0)
MCH: 33.1 pg (ref 26.0–34.0)
MCHC: 34.9 g/dL (ref 30.0–36.0)
MCV: 94.7 fL (ref 80.0–100.0)
Platelets: 142 K/uL — ABNORMAL LOW (ref 150–400)
RBC: 2.84 MIL/uL — ABNORMAL LOW (ref 4.22–5.81)
RDW: 12.6 % (ref 11.5–15.5)
WBC: 4.5 K/uL (ref 4.0–10.5)
nRBC: 0 % (ref 0.0–0.2)

## 2023-12-06 LAB — C DIFFICILE QUICK SCREEN W PCR REFLEX
C Diff antigen: NEGATIVE
C Diff interpretation: NOT DETECTED
C Diff toxin: NEGATIVE

## 2023-12-06 LAB — GLUCOSE, CAPILLARY
Glucose-Capillary: 75 mg/dL (ref 70–99)
Glucose-Capillary: 92 mg/dL (ref 70–99)

## 2023-12-06 LAB — URINE CULTURE: Culture: NO GROWTH

## 2023-12-06 LAB — LACTIC ACID, PLASMA: Lactic Acid, Venous: 1.5 mmol/L (ref 0.5–1.9)

## 2023-12-06 MED ORDER — SODIUM CHLORIDE 0.9 % IV BOLUS
1000.0000 mL | Freq: Once | INTRAVENOUS | Status: AC
  Administered 2023-12-06: 1000 mL via INTRAVENOUS

## 2023-12-06 NOTE — Progress Notes (Signed)
 CC: ogilvie's Subjective: Had gastrografin enema, no evidence of large bowel obstruction Syncopal episode NO emesis UTI Objective: Vital signs in last 24 hours: Temp:  [97.4 F (36.3 C)-98.4 F (36.9 C)] 97.7 F (36.5 C) (07/24 0732) Pulse Rate:  [58-80] 80 (07/24 0732) Resp:  [16-18] 18 (07/24 0732) BP: (62-168)/(33-87) 106/83 (07/24 0732) SpO2:  [98 %-100 %] 100 % (07/24 0732) Last BM Date : 12/06/23  Intake/Output from previous day: 07/23 0701 - 07/24 0700 In: 360 [P.O.:360] Out: -  Intake/Output this shift: Total I/O In: 240 [P.O.:240] Out: -   Physical exam:  Debilitated and chronically ill ABd: soft, no peritonitis or rebound  Lab Results: CBC  Recent Labs    12/05/23 0510 12/06/23 0100  WBC 4.1 4.5  HGB 10.3* 9.4*  HCT 29.7* 26.9*  PLT 153 142*   BMET Recent Labs    12/05/23 0510 12/06/23 0100  NA 131* 133*  K 3.8 3.4*  CL 107 106  CO2 20* 17*  GLUCOSE 91 120*  BUN 12 10  CREATININE 0.43* 0.82  CALCIUM  8.9 8.4*   PT/INR Recent Labs    12/04/23 2136  LABPROT 16.1*  INR 1.2   ABG No results for input(s): PHART, HCO3 in the last 72 hours.  Invalid input(s): PCO2, PO2  Studies/Results: DG BE (COLON)W SINGLE CM (SOL OR THIN BA) Result Date: 12/05/2023 CLINICAL DATA:  83 year old male with a history of transverse colon cancer s/p colectomy several years ago. Worsening abdominal pain. Imaging showing diffuse gaseous colonic distention concerning for colonic ileus versus obstruction. EXAM: SINGLE CONTRAST BARIUM ENEMA TECHNIQUE: After obtaining a scout radiograph, a single column contrast enema was performed using Omnipaque  300 diluted with water. This exam was performed by [facility] PA-C, and was supervised and interpreted by Dr. Norleen Kil. FLUOROSCOPY: 14.9 mGy COMPARISON:  NONE. FINDINGS: Scout radiographs show mild gaseous distension of colon, suspicious for ileus. Multiple surgical staples are seen in the left abdomen.  Small stool burden noted. Retrograde filling of the colon was achieved to the level of the cecum. No evidence of colonic obstruction. No evidence of colonic obstruction or annular constricting lesions. Residual stool mainly in the right colon is noted, which could obscure an intraluminal polyp or mass. No evidence of diverticular disease or fistula. IMPRESSION: No  evidence of colonic obstruction or annular constricting lesion. Mild gaseous distension of colon, suspicious for ileus. Small stool burden noted, mainly in the right colon. Electronically Signed   By: Norleen DELENA Kil M.D.   On: 12/05/2023 15:09   CT ABDOMEN PELVIS W CONTRAST Result Date: 12/04/2023 CLINICAL DATA:  Epigastric pain.  Colon cancer, on chemotherapy. EXAM: CT ABDOMEN AND PELVIS WITH CONTRAST TECHNIQUE: Multidetector CT imaging of the abdomen and pelvis was performed using the standard protocol following bolus administration of intravenous contrast. RADIATION DOSE REDUCTION: This exam was performed according to the departmental dose-optimization program which includes automated exposure control, adjustment of the mA and/or kV according to patient size and/or use of iterative reconstruction technique. CONTRAST:  OMNIPAQUE  IOHEXOL  300 MG/ML  SOLN COMPARISON:  CT 10/30/2023 FINDINGS: Lower chest: Mild atelectasis in the lung bases. No pleural fluid. Tip of the chest port in the right atrium. Hepatobiliary: No focal liver abnormality is seen. No gallstones, gallbladder wall thickening, or biliary dilatation. Pancreas: Soft tissue mass in the pancreas is unchanged or minimally decreased in size from prior exam, 2.5 x 2 cm, series 2, image 28. There is motion artifact through this region which limits detailed  assessment. Distal pancreatic ductal dilatation is again seen. There is no acute peripancreatic inflammation. Spleen: Normal in size without focal abnormality. Adrenals/Urinary Tract: No adrenal nodule. No hydronephrosis. There are  bilateral renal cysts, including parapelvic cysts on the left, needing no further imaging follow-up. No visible renal calculi. Partially distended urinary bladder without wall thickening. Stomach/Bowel: Diffuse gaseous colonic distension with colonic redundancy. There is narrowing in the sigmoid colon spanning 6.2 cm. This colonic segment is nondistended and not well assessed. Normal appendix is visualized in the mid abdomen. Air within mildly prominent but not abnormally distended small bowel. Small amount of air and fluid within the stomach which is mildly distended. There is no bowel inflammation. Vascular/Lymphatic: Aortic and branch atherosclerosis. 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 Portal and splenic veins are patent. The superior mesenteric vein is narrowed just before the mesenteric portal confluence, also present on prior exam. Soft tissue density at the root of the hepatic artery is similar, series 2, image 25, motion artifact limitations on the current exam. Reproductive: Prostatomegaly, 5 cm transverse. Other: No free air. No ascites. No convincing peritoneal thickening or nodularity. No abdominal wall hernia. Multiple surgical clips in the left abdomen/retroperitoneum. Musculoskeletal: No focal bone lesion or acute osseous findings. Degenerative change of the hips, spine, and sacroiliac joints. IMPRESSION: 1. Diffuse gaseous colonic distension with colonic redundancy. There is narrowing in the sigmoid colon spanning 6.2 cm. This colonic segment is nondistended and not well assessed. 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 2. Soft tissue mass in the pancreas is unchanged or minimally decreased in size from prior exam. There is motion artifact through this region which limits detailed assessment. Soft tissue density at the root of the hepatic artery is similar. 3. Additional chronic findings as described. Aortic Atherosclerosis (ICD10-I70.0). Electronically Signed    By: Andrea Gasman M.D.   On: 12/04/2023 18:20    Anti-infectives: Anti-infectives (From admission, onward)    Start     Dose/Rate Route Frequency Ordered Stop   12/04/23 2100  cefTRIAXone  (ROCEPHIN ) 2 g in sodium chloride  0.9 % 100 mL IVPB  Status:  Discontinued        2 g 200 mL/hr over 30 Minutes Intravenous Every 24 hours 12/04/23 2027 12/06/23 1220       Assessment/Plan: Ogilvi'es syndrome multifactorial No need for surgical intervention If does not respond to medical rx may need endoscopic decompression but dilation is not severe We will be available  I personally spent a total of 35 minutes in the care of the patient today including performing a medically appropriate exam/evaluation, counseling and educating, placing orders, referring and communicating with other health care professionals, documenting clinical information in the EHR, independently interpreting and reviewing images studies and coordinating care.     Laneta Luna, MD, [facility]  12/06/2023

## 2023-12-06 NOTE — Progress Notes (Addendum)
 J.Mansy paged due to patients blood pressure. Patient called this nurse due to abdominal pain. Went to assess patient during my assessment patient reported blacking out and feeling dizzy. Vitals signs were as followed. MD ordered 500 of NS and midodrine  to be given.  2351 Patients blood pressure came up to 104/56.  Patient called back out again and reported the same symptoms. B/P was 63/33 MD notified and ordered 1L of NS  0051 Patients blood pressure came up to 103/56. Patient reported feeling better. No further s/s of hypotension were noted. Will continue to monitor.   12/05/23 2340  Vitals  BP (!) 62/40  MAP (mmHg) (!) 47  Pulse Rate 74  MEWS COLOR  MEWS Score Color Yellow  PCA/Epidural/Spinal Assessment  Respiratory Pattern Labored;Regular  MEWS Score  MEWS Temp 0  MEWS Systolic 3  MEWS Pulse 0  MEWS RR 0  MEWS LOC 0  MEWS Score 3  Provider Notification  Provider Name/Title DOROTHA Peaches  Date Provider Notified 12/05/23  Time Provider Notified 2340  Method of Notification Page  Notification Reason Change in status  Provider response See new orders  Date of Provider Response 12/05/23  Time of Provider Response 2342

## 2023-12-06 NOTE — Plan of Care (Signed)
   Problem: Education: Goal: Knowledge of General Education information will improve Description: Including pain rating scale, medication(s)/side effects and non-pharmacologic comfort measures Outcome: Progressing   Problem: Pain Managment: Goal: General experience of comfort will improve and/or be controlled Outcome: Progressing   Problem: Safety: Goal: Ability to remain free from injury will improve Outcome: Progressing

## 2023-12-06 NOTE — Progress Notes (Signed)
 PROGRESS NOTE    Jared Gutierrez  FMW:[id number] DOB: 08-Jul-1940 DOA: 12/04/2023 PCP: Sadie Manna, MD    Brief Narrative:  83 y.o. 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   Per patient and his daughter and wife at the bedside, patient has colon cancer on chemotherapy, currently is on chemo holiday in the past 4 weeks. Pt developed generalized weakness.  He has nausea, diarrhea and abdominal pain, no vomiting.  His abdominal pain is located in the lower abdomen, which is constant, mild, aching, nonradiating.  Not aggravated alleviated by any known factors.  He states that he has diarrhea 3-4 times each day.  Patient reports dysuria, burning with urination and urinary frequency.  No hematuria.  No chest pain, cough, SOB.  No fever or chills.   Data reviewed independently and ED Course: pt was found to have WBC 4.3, GFR> 60, UA (clear appearance, trace amount of leukocyte, negative bacteria, WBC 6-10), temperature normal, blood pressure 119/84, heart rate 65, RR 16, oxygen saturation 97% on room air.  Patient is placed in MedSurg bed for observation.  Dr. Jordis of surgery is consulted.  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  UTI may have contributed partially. 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 EDP consulted Dr. Jordis of general surgery Status post barium enema.  No obstruction noted.  Suspect ileus.   Abdominal pain improving. Plan: Soft diet.  DC IV fluids if tolerating p.o.  No immediate indication or plans for endoscopic evaluation.  Anticipate discharge 7/25 if pain improved.  Diarrhea Doubt infectious but C. difficile pending.  Will follow-up.  Continue empiric contact precautions for now   Cancer of transverse colon [facility]):  On chemotherapy. - Dr. Babara oncology consulted by surgery   HTN (hypertension) Continue amlodipine  and irbesartan    UTI (urinary tract infection), ruled out Urinalysis showed trace amount of leukocyte and WBC 6-10.  Patient had some suprapubic pain but that could be related to abdominal pathology.  Urine culture with no growth. 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  Complicates overall care and prognosis    DVT prophylaxis: SQ heparin  Code Status: DNR Family Communication: Wife and daughter at bedside 7/23, 7/24 Disposition Plan: Status is: Inpatient Remains inpatient appropriate because: Intractable abdominal pain.  Ileus       Level of care: Telemetry Medical  Consultants:  General Surgery Oncology  Procedures:  None  Antimicrobials:   Subjective: Seen and examined.  Seen in bed.  No visible distress Objective: Vitals:   12/06/23 0205 12/06/23 0207 12/06/23 0446 12/06/23 0732  BP: 118/60 116/66 (!) 105/54 106/83  Pulse:  66 67 80  Resp:  17 16 18   Temp:   97.8 F (36.6 C) 97.7 F (36.5 C)  TempSrc:   Oral Oral  SpO2:  100%  99% 100%  Weight:      Height:        Intake/Output Summary (Last 24 hours) at 12/06/2023 1219 Last data filed at 12/06/2023 0900 Gross per 24 hour  Intake 600 ml  Output --  Net 600 ml   Filed Weights   12/04/23 1032  Weight: 90.3 kg    Examination:  General exam: NAD Respiratory system: Lungs clear.  Normal work of  breathing.  Room air Cardiovascular system: S1-S2, RRR, no murmurs, no pedal edema Gastrointestinal system: Soft, nontender, nondistended, normal bowel sounds, Central nervous system: Alert and oriented. No focal neurological deficits. Extremities: Symmetric 5 x 5 power. Skin: No rashes, lesions or ulcers Psychiatry: Judgement and insight appear normal. Mood & affect appropriate.     Data Reviewed: I have personally reviewed following labs and imaging studies  CBC: Recent Labs  Lab 12/04/23 1035 12/05/23 0510 12/06/23 0100  WBC 4.7 4.1 4.5  HGB 12.3* 10.3* 9.4*  HCT 35.9* 29.7* 26.9*  MCV 96.2 92.8 94.7  PLT 202 153 142*   Basic Metabolic Panel: Recent Labs  Lab 12/04/23 1035 12/05/23 0510 12/06/23 0100  NA 131* 131* 133*  K 4.6 3.8 3.4*  CL 103 107 106  CO2 19* 20* 17*  GLUCOSE 137* 91 120*  BUN 15 12 10   CREATININE 0.68 0.43* 0.82  CALCIUM  9.7 8.9 8.4*   GFR: Estimated Creatinine Clearance: 74.5 mL/min (by C-G formula based on SCr of 0.82 mg/dL). Liver Function Tests: Recent Labs  Lab 12/04/23 1035  AST 28  ALT 17  ALKPHOS 49  BILITOT 0.8  PROT 6.2*  ALBUMIN 3.3*   Recent Labs  Lab 12/04/23 1035  LIPASE 28   No results for input(s): AMMONIA in the last 168 hours. Coagulation Profile: Recent Labs  Lab 12/04/23 2136  INR 1.2   Cardiac Enzymes: No results for input(s): CKTOTAL, CKMB, CKMBINDEX, TROPONINI in the last 168 hours. BNP (last 3 results) No results for input(s): PROBNP in the last 8760 hours. HbA1C: No results for input(s): HGBA1C in the last 72 hours. CBG: Recent Labs  Lab 12/06/23 0849  GLUCAP 75   Lipid Profile: No results for input(s): CHOL, HDL, LDLCALC, TRIG, CHOLHDL, LDLDIRECT in the last 72 hours. Thyroid  Function Tests: No results for input(s): TSH, T4TOTAL, FREET4, T3FREE, THYROIDAB in the last 72 hours. Anemia Panel: No results for input(s): VITAMINB12, FOLATE, FERRITIN,  TIBC, IRON, RETICCTPCT in the last 72 hours. Sepsis Labs: Recent Labs  Lab 12/06/23 0216  LATICACIDVEN 1.5    Recent Results (from the past 240 hours)  Urine Culture     Status: None   Collection Time: 12/04/23 10:35 AM   Specimen: Urine, Clean Catch  Result Value Ref Range Status   Specimen Description   Final    URINE, CLEAN CATCH Performed at [facility], [address] [place], [place] [address]    Special Requests   Final    NONE Performed at [facility], [address] [place], [place] [address]    Culture   Final    NO GROWTH Performed at [facility], [address], [place], [place] [address]    Report Status 12/06/2023 FINAL  Final  C Difficile Quick Screen w PCR reflex     Status: None   Collection Time: 12/06/23 10:42 AM   Specimen: STOOL  Result Value Ref Range Status   C Diff antigen NEGATIVE NEGATIVE Final   C Diff toxin NEGATIVE NEGATIVE Final  C Diff interpretation No C. difficile detected.  Final    Comment: Performed at [facility], [address] [place], [place] [address]         Radiology Studies: DG BE (COLON)W SINGLE CM (SOL OR THIN BA) Result Date: 12/05/2023 CLINICAL DATA:  83 year old male with a history of transverse colon cancer s/p colectomy several years ago. Worsening abdominal pain. Imaging showing diffuse gaseous colonic distention concerning for colonic ileus versus obstruction. EXAM: SINGLE CONTRAST BARIUM ENEMA TECHNIQUE: After obtaining a scout radiograph, a single column contrast enema was performed using Omnipaque  300 diluted with water. This exam was performed by [facility] PA-C, and was supervised and interpreted by Dr. Norleen Kil. FLUOROSCOPY: 14.9 mGy COMPARISON:  NONE. FINDINGS: Scout radiographs show mild gaseous distension of colon, suspicious for ileus. Multiple surgical staples are seen in the left abdomen. Small stool burden noted. Retrograde filling of the colon  was achieved to the level of the cecum. No evidence of colonic obstruction. No evidence of colonic obstruction or annular constricting lesions. Residual stool mainly in the right colon is noted, which could obscure an intraluminal polyp or mass. No evidence of diverticular disease or fistula. IMPRESSION: No  evidence of colonic obstruction or annular constricting lesion. Mild gaseous distension of colon, suspicious for ileus. Small stool burden noted, mainly in the right colon. Electronically Signed   By: Norleen DELENA Kil M.D.   On: 12/05/2023 15:09   CT ABDOMEN PELVIS W CONTRAST Result Date: 12/04/2023 CLINICAL DATA:  Epigastric pain.  Colon cancer, on chemotherapy. EXAM: CT ABDOMEN AND PELVIS WITH CONTRAST TECHNIQUE: Multidetector CT imaging of the abdomen and pelvis was performed using the standard protocol following bolus administration of intravenous contrast. RADIATION DOSE REDUCTION: This exam was performed according to the departmental dose-optimization program which includes automated exposure control, adjustment of the mA and/or kV according to patient size and/or use of iterative reconstruction technique. CONTRAST:  OMNIPAQUE  IOHEXOL  300 MG/ML  SOLN COMPARISON:  CT 10/30/2023 FINDINGS: Lower chest: Mild atelectasis in the lung bases. No pleural fluid. Tip of the chest port in the right atrium. Hepatobiliary: No focal liver abnormality is seen. No gallstones, gallbladder wall thickening, or biliary dilatation. Pancreas: Soft tissue mass in the pancreas is unchanged or minimally decreased in size from prior exam, 2.5 x 2 cm, series 2, image 28. There is motion artifact through this region which limits detailed assessment. Distal pancreatic ductal dilatation is again seen. There is no acute peripancreatic inflammation. Spleen: Normal in size without focal abnormality. Adrenals/Urinary Tract: No adrenal nodule. No hydronephrosis. There are bilateral renal cysts, including parapelvic cysts on the left,  needing no further imaging follow-up. No visible renal calculi. Partially distended urinary bladder without wall thickening. Stomach/Bowel: Diffuse gaseous colonic distension with colonic redundancy. There is narrowing in the sigmoid colon spanning 6.2 cm. This colonic segment is nondistended and not well assessed. Normal appendix is visualized in the mid abdomen. Air within mildly prominent but not abnormally distended small bowel. Small amount of air and fluid within the stomach which is mildly distended. There is no bowel inflammation. Vascular/Lymphatic: Aortic and branch atherosclerosis. 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 Portal and splenic veins are patent. The superior mesenteric vein is narrowed just before the mesenteric portal confluence, also present on prior exam. Soft tissue density at the root of the hepatic artery is similar, series 2, image 25, motion artifact limitations on the current exam. Reproductive: Prostatomegaly, 5 cm transverse. Other: No free  air. No ascites. No convincing peritoneal thickening or nodularity. No abdominal wall hernia. Multiple surgical clips in the left abdomen/retroperitoneum. Musculoskeletal: No focal bone lesion or acute osseous findings. Degenerative change of the hips, spine, and sacroiliac joints. IMPRESSION: 1. Diffuse gaseous colonic distension with colonic redundancy. There is narrowing in the sigmoid colon spanning 6.2 cm. This colonic segment is nondistended and not well assessed. 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 2. Soft tissue mass in the pancreas is unchanged or minimally decreased in size from prior exam. There is motion artifact through this region which limits detailed assessment. Soft tissue density at the root of the hepatic artery is similar. 3. Additional chronic findings as described. Aortic Atherosclerosis (ICD10-I70.0). Electronically Signed   By: Andrea Gasman M.D.   On: 12/04/2023 18:20         Scheduled Meds:  amLODipine   2.5 mg Oral Daily   aspirin  EC  81 mg Oral Daily   Chlorhexidine  Gluconate Cloth  6 each Topical Daily   heparin   5,000 Units Subcutaneous Q8H   irbesartan   150 mg Oral Daily   pantoprazole   40 mg Oral Q1200   sodium chloride  flush  10-40 mL Intracatheter Q12H   tamsulosin   0.4 mg Oral QPC supper   Continuous Infusions:  sodium chloride  75 mL/hr at 12/06/23 1112   cefTRIAXone  (ROCEPHIN )  IV 2 g (12/05/23 2104)     LOS: 1 day     Calvin KATHEE Robson, MD [facility]   If 7PM-7AM, please contact night-coverage  12/06/2023, 12:19 PM

## 2023-12-06 NOTE — Progress Notes (Signed)
 Hematology/Oncology Progress note [phone] [phone]     Patient Care Team: Sadie Manna, MD as PCP - General (Internal Medicine) Darliss Rogue, MD as PCP - Cardiology (Cardiology) Sadie Manna, MD (Internal Medicine) Jeri Donnice Burkes, MD as Referring Physician (Gastroenterology) Maurie Rayfield BIRCH, RN as Oncology Nurse Navigator Babara Call, MD as Consulting Physician (Oncology) Lenn Aran, MD as Consulting Physician (Radiation Oncology)   Name of the patient: Jared Gutierrez  [id number]  [birth year]  Date of visit: 12/06/23   INTERVAL HISTORY-   Had gastrografin enema and has had a bowel movement.  Wife is at bedside.    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    Priority: High   Hyponatremia 08/14/2023    Priority: Medium    Lung nodules 01/16/2023    Priority: Medium    Anemia due to chemotherapy 12/02/2020    Priority: Medium    Encounter for antineoplastic chemotherapy 06/25/2019    Priority: Medium    GERD (gastroesophageal reflux disease) 09/18/2023    Priority: Low   UTI (urinary tract infection) 08/28/2023    Priority: Low   Bilateral leg edema 01/30/2023    Priority: Low   Weight loss 12/19/2022    Priority: Low   Subconjunctival hemorrhage 09/12/2022    Priority: Low   Epistaxis 07/04/2022    Priority: Low   Elevated PSA 01/03/2022    Priority: Low   Constipation 10/26/2021    Priority: Low   Chemotherapy induced diarrhea 09/28/2021    Priority: Low   Hypotension due to hypovolemia 05/18/2021    Priority: Low   Port-A-Cath in place 03/08/2021    Priority: Low   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    Priority: Low   Hypokalemia 07/23/2019    Priority: Low   Goals of care, counseling/discussion 06/25/2019    Priority: Low   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    Priority: Low   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   Abdominal pain 12/04/2023   HTN (hypertension) 12/04/2023   Obesity (BMI 30-39.9) 12/04/2023   Diarrhea 12/04/2023   BPH (benign prostatic hyperplasia) 03/28/2022   Need for prophylactic vaccination and inoculation against influenza 03/28/2022   Aortic atherosclerosis (HCC) 08/02/2020   Hepatic steatosis 08/02/2020   Chemotherapy-induced neutropenia (HCC) 12/20/2019   Cellulitis of left lower extremity 11/03/2019   Pseudogout of foot, left 11/03/2019   Hypomagnesemia 10/29/2019   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   Sepsis (HCC) 08/16/2019   Cellulitis of right lower extremity 08/16/2019   Maintenance chemotherapy 08/16/2019   Lesion of right lung 07/01/2019   Nephrolithiasis 07/01/2019   Osteoarthritis 07/01/2019   Morbid obesity with BMI of 40.0-44.9, adult (HCC) 01/27/2019   Elevated CEA 12/24/2018   Hypertension 05/10/2016   Primary osteoarthritis of left knee 02/05/2014   History of colon cancer, stage I 10/17/2013     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   Chicken pox    Colon cancer (HCC)    History of kidney stones    Hypertension    Lipoma of colon    Nephrolithiasis    Nephrolithiasis    Obesity    Shingles    Tubular adenoma of colon    multiple fragments     Past Surgical History:  Procedure Laterality Date   COLON SURGERY     COLONOSCOPY N/A 10/02/2014   Procedure: COLONOSCOPY;  Surgeon: Donnice Vaughn Manes, MD;  Location: [facility] ENDOSCOPY;  Service: Endoscopy;  Laterality: N/A;   COLONOSCOPY WITH PROPOFOL  N/A 02/16/2017   Procedure: COLONOSCOPY WITH PROPOFOL ;  Surgeon: Viktoria Lamar DASEN, MD;  Location: [facility] ENDOSCOPY;  Service: Endoscopy;  Laterality: N/A;   COLONOSCOPY WITH PROPOFOL  N/A 10/05/2020   Procedure: COLONOSCOPY WITH PROPOFOL ;  Surgeon: Maryruth Ole DASEN, MD;  Location: [facility] ENDOSCOPY;   Service: Endoscopy;  Laterality: N/A;   EUS N/A 04/17/2019   Procedure: FULL UPPER ENDOSCOPIC ULTRASOUND (EUS) RADIAL;  Surgeon: Queenie Asberry LABOR, MD;  Location[facility] ENDOSCOPY;  Service: Gastroenterology;  Laterality: N/A;   KIDNEY STONE SURGERY     PARTIAL COLECTOMY  10/17/2013   PORTACATH PLACEMENT Right 06/13/2019   Procedure: INSERTION PORT-A-CATH;  Surgeon: Tye Millet, DO;  Location: [facility] ORS;  Service: General;  Laterality: Right;    Social History   Socioeconomic History   Marital status: Married    Spouse name: Not on file   Number of children: Not on file   Years of education: Not on file   Highest education level: Not on file  Occupational History   Not on file  Tobacco Use   Smoking status: Never   Smokeless tobacco: Never  Vaping Use   Vaping status: Never Used  Substance and Sexual Activity   Alcohol use: Yes    Comment: socially    Drug use: No   Sexual activity: Not on file  Other Topics Concern   Not on file  Social History Narrative   Not on file   Social Drivers of Health   Financial Resource Strain: Not on file  Food Insecurity: No Food Insecurity (12/04/2023)   Hunger Vital Sign    Worried About Running Out of Food in the Last Year: Never true    Ran Out of Food in the Last Year: Never true  Transportation Needs: No Transportation Needs (12/04/2023)   PRAPARE - Administrator, Civil Service (Medical): No    Lack of Transportation (Non-Medical): No  Physical Activity: Not on file  Stress: Not on file  Social Connections: Socially Integrated (12/04/2023)   Social Connection and Isolation Panel    Frequency of Communication with Friends and Family: More than three times a week    Frequency of Social Gatherings with Friends and Family: More than three times a week    Attends Religious Services: 1 to 4 times per year    Active Member of [employer] or Organizations: No    Attends Engineer, structural: 1 to 4 times per year     Marital Status: Married  Catering manager Violence: Not At Risk (12/04/2023)   Humiliation, Afraid, Rape, and Kick questionnaire    Fear of Current or Ex-Partner: No    Emotionally Abused: No    Physically Abused: No    Sexually Abused: No     Family History  Problem Relation Age of Onset   Cancer Mother    Breast cancer Mother    COPD Father      Current Facility-Administered Medications:    0.9 %  sodium chloride  infusion, , Intravenous, Continuous, Niu, Xilin, MD, Last Rate: 75 mL/hr at 12/06/23 1112, New Bag at 12/06/23 1112   acetaminophen  (TYLENOL ) tablet 650 mg, 650 mg, Oral, Q6H PRN, Niu, Xilin, MD   amLODipine  (NORVASC ) tablet 2.5 mg, 2.5 mg, Oral, Daily, Niu, Xilin, MD,  2.5 mg at 12/06/23 1053   aspirin  EC tablet 81 mg, 81 mg, Oral, Daily, Niu, Xilin, MD, 81 mg at 12/06/23 1055   Chlorhexidine  Gluconate Cloth 2 % PADS 6 each, 6 each, Topical, Daily, Niu, Xilin, MD, 6 each at 12/06/23 1059   heparin  injection 5,000 Units, 5,000 Units, Subcutaneous, Q8H, Niu, Xilin, MD, 5,000 Units at 12/06/23 2148   hydrALAZINE  (APRESOLINE ) injection 5 mg, 5 mg, Intravenous, Q2H PRN, Niu, Xilin, MD   irbesartan  (AVAPRO ) tablet 150 mg, 150 mg, Oral, Daily, Niu, Xilin, MD, 150 mg at 12/06/23 1055   morphine  (PF) 2 MG/ML injection 2 mg, 2 mg, Intravenous, Q4H PRN, Mansy, Jan A, MD, 2 mg at 12/05/23 2355   ondansetron  (ZOFRAN ) injection 4 mg, 4 mg, Intravenous, Q8H PRN, Niu, Xilin, MD, 4 mg at 12/05/23 2347   pantoprazole  (PROTONIX ) EC tablet 40 mg, 40 mg, Oral, Q1200, Niu, Xilin, MD, 40 mg at 12/06/23 1235   sodium chloride  flush (NS) 0.9 % injection 10-40 mL, 10-40 mL, Intracatheter, Q12H, Niu, Xilin, MD, 10 mL at 12/06/23 2150   sodium chloride  flush (NS) 0.9 % injection 10-40 mL, 10-40 mL, Intracatheter, PRN, Niu, Xilin, MD  Facility-Administered Medications Ordered in Other Encounters:    0.9 %  sodium chloride  infusion, , Intravenous, Once, Corcoran, Melissa C, MD   0.9 %  sodium chloride   infusion, , Intravenous, Continuous, Corcoran, Melissa C, MD, Last Rate: 10 mL/hr at 12/31/19 1000, New Bag at 10/05/20 1045   heparin  lock flush 100 unit/mL, 500 Units, Intravenous, Once, Corcoran, Melissa C, MD   sodium chloride  flush (NS) 0.9 % injection 10 mL, 10 mL, Intracatheter, PRN, Babara Call, MD, 10 mL at 02/01/23 1341   Physical exam:  Vitals:   12/06/23 0446 12/06/23 0732 12/06/23 1515 12/06/23 1959  BP: (!) 105/54 106/83 (!) 148/89 (!) 155/79  Pulse: 67 80 86 68  Resp: 16 18 18 16   Temp: 97.8 F (36.6 C) 97.7 F (36.5 C) 97.8 F (36.6 C) 98.4 F (36.9 C)  TempSrc: Oral Oral Oral Oral  SpO2: 99% 100%  100%  Weight:      Height:       Physical Exam Constitutional:      General: He is not in acute distress.    Appearance: He is not diaphoretic.  HENT:     Head: Normocephalic and atraumatic.  Eyes:     General: No scleral icterus. Cardiovascular:     Rate and Rhythm: Normal rate and regular rhythm.     Heart sounds: No murmur heard. Pulmonary:     Effort: Pulmonary effort is normal. No respiratory distress.  Abdominal:     General: There is no distension.     Palpations: Abdomen is soft.     Tenderness: There is no abdominal tenderness.  Musculoskeletal:        General: Normal range of motion.     Cervical back: Normal range of motion and neck supple.  Skin:    General: Skin is warm and dry.     Findings: No erythema.  Neurological:     Mental Status: He is alert and oriented to person, place, and time. Mental status is at baseline.     Motor: No abnormal muscle tone.  Psychiatric:        Mood and Affect: Affect normal.       Labs    Latest Ref Rng & Units 12/06/2023    1:00 AM 12/05/2023    5:10 AM 12/04/2023  10:35 AM  CBC  WBC 4.0 - 10.5 K/uL 4.5  4.1  4.7   Hemoglobin 13.0 - 17.0 g/dL 9.4  89.6  87.6   Hematocrit 39.0 - 52.0 % 26.9  29.7  35.9   Platelets 150 - 400 K/uL 142  153  202       Latest Ref Rng & Units 12/06/2023    1:00 AM  12/05/2023    5:10 AM 12/04/2023   10:35 AM  CMP  Glucose 70 - 99 mg/dL 879  91  862   BUN 8 - 23 mg/dL 10  12  15    Creatinine 0.61 - 1.24 mg/dL 9.17  9.56  9.31   Sodium 135 - 145 mmol/L 133  131  131   Potassium 3.5 - 5.1 mmol/L 3.4  3.8  4.6   Chloride 98 - 111 mmol/L 106  107  103   CO2 22 - 32 mmol/L 17  20  19    Calcium  8.9 - 10.3 mg/dL 8.4  8.9  9.7   Total Protein 6.5 - 8.1 g/dL   6.2   Total Bilirubin 0.0 - 1.2 mg/dL   0.8   Alkaline Phos 38 - 126 U/L   49   AST 15 - 41 U/L   28   ALT 0 - 44 U/L   17      RADIOGRAPHIC STUDIES: I have personally reviewed the radiological images as listed and agreed with the findings in the report. DG BE (COLON)W SINGLE CM (SOL OR THIN BA) Result Date: 12/05/2023 CLINICAL DATA:  83 year old male with a history of transverse colon cancer s/p colectomy several years ago. Worsening abdominal pain. Imaging showing diffuse gaseous colonic distention concerning for colonic ileus versus obstruction. EXAM: SINGLE CONTRAST BARIUM ENEMA TECHNIQUE: After obtaining a scout radiograph, a single column contrast enema was performed using Omnipaque  300 diluted with water. This exam was performed by [facility] PA-C, and was supervised and interpreted by Dr. Norleen Kil. FLUOROSCOPY: 14.9 mGy COMPARISON:  NONE. FINDINGS: Scout radiographs show mild gaseous distension of colon, suspicious for ileus. Multiple surgical staples are seen in the left abdomen. Small stool burden noted. Retrograde filling of the colon was achieved to the level of the cecum. No evidence of colonic obstruction. No evidence of colonic obstruction or annular constricting lesions. Residual stool mainly in the right colon is noted, which could obscure an intraluminal polyp or mass. No evidence of diverticular disease or fistula. IMPRESSION: No  evidence of colonic obstruction or annular constricting lesion. Mild gaseous distension of colon, suspicious for ileus. Small stool burden noted,  mainly in the right colon. Electronically Signed   By: Norleen DELENA Kil M.D.   On: 12/05/2023 15:09   CT ABDOMEN PELVIS W CONTRAST Result Date: 12/04/2023 CLINICAL DATA:  Epigastric pain.  Colon cancer, on chemotherapy. EXAM: CT ABDOMEN AND PELVIS WITH CONTRAST TECHNIQUE: Multidetector CT imaging of the abdomen and pelvis was performed using the standard protocol following bolus administration of intravenous contrast. RADIATION DOSE REDUCTION: This exam was performed according to the departmental dose-optimization program which includes automated exposure control, adjustment of the mA and/or kV according to patient size and/or use of iterative reconstruction technique. CONTRAST:  OMNIPAQUE  IOHEXOL  300 MG/ML  SOLN COMPARISON:  CT 10/30/2023 FINDINGS: Lower chest: Mild atelectasis in the lung bases. No pleural fluid. Tip of the chest port in the right atrium. Hepatobiliary: No focal liver abnormality is seen. No gallstones, gallbladder wall thickening, or biliary dilatation. Pancreas: Soft tissue mass  in the pancreas is unchanged or minimally decreased in size from prior exam, 2.5 x 2 cm, series 2, image 28. There is motion artifact through this region which limits detailed assessment. Distal pancreatic ductal dilatation is again seen. There is no acute peripancreatic inflammation. Spleen: Normal in size without focal abnormality. Adrenals/Urinary Tract: No adrenal nodule. No hydronephrosis. There are bilateral renal cysts, including parapelvic cysts on the left, needing no further imaging follow-up. No visible renal calculi. Partially distended urinary bladder without wall thickening. Stomach/Bowel: Diffuse gaseous colonic distension with colonic redundancy. There is narrowing in the sigmoid colon spanning 6.2 cm. This colonic segment is nondistended and not well assessed. Normal appendix is visualized in the mid abdomen. Air within mildly prominent but not abnormally distended small bowel. Small amount of air  and fluid within the stomach which is mildly distended. There is no bowel inflammation. Vascular/Lymphatic: Aortic and branch atherosclerosis. 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 Portal and splenic veins are patent. The superior mesenteric vein is narrowed just before the mesenteric portal confluence, also present on prior exam. Soft tissue density at the root of the hepatic artery is similar, series 2, image 25, motion artifact limitations on the current exam. Reproductive: Prostatomegaly, 5 cm transverse. Other: No free air. No ascites. No convincing peritoneal thickening or nodularity. No abdominal wall hernia. Multiple surgical clips in the left abdomen/retroperitoneum. Musculoskeletal: No focal bone lesion or acute osseous findings. Degenerative change of the hips, spine, and sacroiliac joints. IMPRESSION: 1. Diffuse gaseous colonic distension with colonic redundancy. There is narrowing in the sigmoid colon spanning 6.2 cm. This colonic segment is nondistended and not well assessed. 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 2. Soft tissue mass in the pancreas is unchanged or minimally decreased in size from prior exam. There is motion artifact through this region which limits detailed assessment. Soft tissue density at the root of the hepatic artery is similar. 3. Additional chronic findings as described. Aortic Atherosclerosis (ICD10-I70.0). Electronically Signed   By: Andrea Gasman M.D.   On: 12/04/2023 18:20    Assessment and plan-   # pseudo bowel obstruction Status post Gastrografin enema and has passed bowel movement.  # UTI ruled out.  Cultures negative.  Antibiotics was discontinued. 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 Stable size on recent CT.  Not amenable for percutaneous biopsy.  She decision was made to hold off biopsy and continue to monitor.  He will follow-up outpatient with me for further  management.  Thank you for allowing me to participate in the care of this patient.   Zelphia Cap, MD, PhD Hematology Oncology 12/06/2023

## 2023-12-07 DIAGNOSIS — R109 Unspecified abdominal pain: Secondary | ICD-10-CM | POA: Diagnosis not present

## 2023-12-07 LAB — GLUCOSE, CAPILLARY
Glucose-Capillary: 64 mg/dL — ABNORMAL LOW (ref 70–99)
Glucose-Capillary: 83 mg/dL (ref 70–99)
Glucose-Capillary: 91 mg/dL (ref 70–99)

## 2023-12-07 MED ORDER — PANTOPRAZOLE SODIUM 40 MG PO TBEC: 40.0000 mg | DELAYED_RELEASE_TABLET | Freq: Every morning | ORAL | 0 refills | Status: DC

## 2023-12-07 MED ORDER — HEPARIN SOD (PORK) LOCK FLUSH 100 UNIT/ML IV SOLN
500.0000 [IU] | INTRAVENOUS | Status: AC | PRN
  Administered 2023-12-07: 500 [IU]
  Filled 2023-12-07: qty 5

## 2023-12-07 NOTE — Care Management Important Message (Signed)
 Important Message  Patient Details  Name: Jared Gutierrez MRN: [id number] Date of Birth: 11/10/1940   Important Message Given:  Yes - Medicare IM     Rojelio SHAUNNA Rattler 12/07/2023, 1:21 PM

## 2023-12-07 NOTE — Discharge Summary (Signed)
 Physician Discharge Summary  Jared Gutierrez FMW:[id number] DOB: 02-14-1941 DOA: 12/04/2023  PCP: Sadie Manna, MD  Admit date: 12/04/2023 Discharge date: 12/07/2023  Admitted From: Home Disposition:  Home w home health  Recommendations for Outpatient Follow-up:  Follow up with PCP in 1-2 weeks   Home Health:Yes PT OT  Equipment/Devices:None   Discharge Condition:Stable  CODE STATUS:DNR  Diet recommendation: Reg  Brief/Interim Summary:  83 y.o. 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   Per patient and his daughter and wife at the bedside, patient has colon cancer on chemotherapy, currently is on chemo holiday in the past 4 weeks. Pt developed generalized weakness.  He has nausea, diarrhea and abdominal pain, no vomiting.  His abdominal pain is located in the lower abdomen, which is constant, mild, aching, nonradiating.  Not aggravated alleviated by any known factors.  He states that he has diarrhea 3-4 times each day.  Patient reports dysuria, burning with urination and urinary frequency.  No hematuria.  No chest pain, cough, SOB.  No fever or chills.   Data reviewed independently and ED Course: pt was found to have WBC 4.3, GFR> 60, UA (clear appearance, trace amount of leukocyte, negative bacteria, WBC 6-10), temperature normal, blood pressure 119/84, heart rate 65, RR 16, oxygen saturation 97% on room air.  Patient is placed in MedSurg bed for observation.  Dr. Jordis of surgery is consulted.      Discharge Diagnoses:  Principal Problem:   Abdominal pain Active Problems:   Diarrhea   Cancer of transverse colon (HCC)   HTN (hypertension)   UTI (urinary tract infection)   BPH (benign prostatic hyperplasia)   Obesity (BMI 30-39.9)   Ogilvie syndrome   Ogilvie's syndrome   Abdominal pain: Patient complains of suprapubic abdominal  tenderness.  UTI may have contributed partially. 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 EDP consulted Dr. Jordis of general surgery Status post barium enema.  No obstruction noted.  Suspect ileus.  Abdominal pain improving. Plan: DC home.  Recommend soft diet.  Recommend frequent meals.  Switch Protonix  for Prilosec as patient responded to Protonix  better in the hospital.   Diarrhea Resolved.  C. difficile negative   Cancer of transverse colon (HCC):  On chemotherapy. - Dr. Babara oncology consulted by surgery.  Outpatient oncology follow-up  Discharge Instructions  Discharge Instructions     Diet - low sodium heart healthy   Complete by: As directed    Increase activity slowly   Complete by: As directed       Allergies as of 12/07/2023   No Known Allergies      Medication List     STOP taking these medications    docusate sodium  100 MG capsule Commonly known as: Colace   HYDROcodone -acetaminophen  5-325 MG tablet Commonly known as: NORCO/VICODIN   loperamide  2 MG capsule Commonly known as: IMODIUM    omeprazole  20 MG capsule Commonly known as: PRILOSEC   senna-docusate 8.6-50 MG tablet Commonly known as: Senna S   tamsulosin  0.4 MG Caps capsule Commonly known as: FLOMAX        TAKE these medications    acetaminophen  500 MG tablet Commonly known as: TYLENOL  Take 500 mg by mouth every 6 (six) hours as needed.   amLODipine  2.5 MG tablet Commonly known as:  NORVASC  Take 2.5 mg by mouth daily.   aspirin  EC 81 MG tablet Take 81 mg by mouth daily.   cyanocobalamin 500 MCG tablet Commonly known as: VITAMIN B12 Take by mouth.   Ferrous Fumarate 324 (106 Fe) MG Tabs tablet Commonly known as: HEMOCYTE - 106 mg FE Take 1 tablet by mouth.   furosemide  20 MG tablet Commonly known as: LASIX  Take 1 tablet (20 mg total) by mouth as  needed. Prn for swelling   lidocaine -prilocaine  cream Commonly known as: EMLA  Apply small amount to port and cover with saran wrap 1-2 hours prior to port access   loratadine 10 MG tablet Commonly known as: CLARITIN Take 10 mg by mouth daily.   megestrol  40 MG tablet Commonly known as: MEGACE  TAKE 2 TABLETS BY MOUTH 2 TIMES DAILY.   olmesartan 20 MG tablet Commonly known as: BENICAR Take 1 tablet by mouth daily.   ondansetron  8 MG tablet Commonly known as: ZOFRAN  Take 1 tablet (8 mg total) by mouth 2 (two) times daily as needed for refractory nausea / vomiting. Start on day 3 after chemotherapy.   pantoprazole  40 MG tablet Commonly known as: PROTONIX  Take 1 tablet (40 mg total) by mouth every morning. Take in the morning on an empty stomach   potassium chloride  SA 20 MEQ tablet Commonly known as: Klor-Con  M20 Take 1 tablet (20 mEq total) by mouth daily.   PROBIOTIC DAILY PO Take 1 tablet by mouth daily.   prochlorperazine  10 MG tablet Commonly known as: COMPAZINE  Take 1 tablet (10 mg total) by mouth every 6 (six) hours as needed (NAUSEA).   Vitamin D 125 MCG (5000 UT) Caps Take 5,000 mg by mouth.        No Known Allergies  Consultations: Oncology Surgery   Procedures/Studies: DG BE (COLON)W SINGLE CM (SOL OR THIN BA) Result Date: 12/05/2023 CLINICAL DATA:  83 year old male with a history of transverse colon cancer s/p colectomy several years ago. Worsening abdominal pain. Imaging showing diffuse gaseous colonic distention concerning for colonic ileus versus obstruction. EXAM: SINGLE CONTRAST BARIUM ENEMA TECHNIQUE: After obtaining a scout radiograph, a single column contrast enema was performed using Omnipaque  300 diluted with water. This exam was performed by [facility] PA-C, and was supervised and interpreted by Dr. Norleen Kil. FLUOROSCOPY: 14.9 mGy COMPARISON:  NONE. FINDINGS: Scout radiographs show mild gaseous distension of colon, suspicious for  ileus. Multiple surgical staples are seen in the left abdomen. Small stool burden noted. Retrograde filling of the colon was achieved to the level of the cecum. No evidence of colonic obstruction. No evidence of colonic obstruction or annular constricting lesions. Residual stool mainly in the right colon is noted, which could obscure an intraluminal polyp or mass. No evidence of diverticular disease or fistula. IMPRESSION: No  evidence of colonic obstruction or annular constricting lesion. Mild gaseous distension of colon, suspicious for ileus. Small stool burden noted, mainly in the right colon. Electronically Signed   By: Norleen DELENA Kil M.D.   On: 12/05/2023 15:09   CT ABDOMEN PELVIS W CONTRAST Result Date: 12/04/2023 CLINICAL DATA:  Epigastric pain.  Colon cancer, on chemotherapy. EXAM: CT ABDOMEN AND PELVIS WITH CONTRAST TECHNIQUE: Multidetector CT imaging of the abdomen and pelvis was performed using the standard protocol following bolus administration of intravenous contrast. RADIATION DOSE REDUCTION: This exam was performed according to the departmental dose-optimization program which includes automated exposure control, adjustment of the mA and/or kV according to patient size and/or use of iterative reconstruction technique.  CONTRAST:  OMNIPAQUE  IOHEXOL  300 MG/ML  SOLN COMPARISON:  CT 10/30/2023 FINDINGS: Lower chest: Mild atelectasis in the lung bases. No pleural fluid. Tip of the chest port in the right atrium. Hepatobiliary: No focal liver abnormality is seen. No gallstones, gallbladder wall thickening, or biliary dilatation. Pancreas: Soft tissue mass in the pancreas is unchanged or minimally decreased in size from prior exam, 2.5 x 2 cm, series 2, image 28. There is motion artifact through this region which limits detailed assessment. Distal pancreatic ductal dilatation is again seen. There is no acute peripancreatic inflammation. Spleen: Normal in size without focal abnormality. Adrenals/Urinary  Tract: No adrenal nodule. No hydronephrosis. There are bilateral renal cysts, including parapelvic cysts on the left, needing no further imaging follow-up. No visible renal calculi. Partially distended urinary bladder without wall thickening. Stomach/Bowel: Diffuse gaseous colonic distension with colonic redundancy. There is narrowing in the sigmoid colon spanning 6.2 cm. This colonic segment is nondistended and not well assessed. Normal appendix is visualized in the mid abdomen. Air within mildly prominent but not abnormally distended small bowel. Small amount of air and fluid within the stomach which is mildly distended. There is no bowel inflammation. Vascular/Lymphatic: Aortic and branch atherosclerosis. 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 Portal and splenic veins are patent. The superior mesenteric vein is narrowed just before the mesenteric portal confluence, also present on prior exam. Soft tissue density at the root of the hepatic artery is similar, series 2, image 25, motion artifact limitations on the current exam. Reproductive: Prostatomegaly, 5 cm transverse. Other: No free air. No ascites. No convincing peritoneal thickening or nodularity. No abdominal wall hernia. Multiple surgical clips in the left abdomen/retroperitoneum. Musculoskeletal: No focal bone lesion or acute osseous findings. Degenerative change of the hips, spine, and sacroiliac joints. IMPRESSION: 1. Diffuse gaseous colonic distension with colonic redundancy. There is narrowing in the sigmoid colon spanning 6.2 cm. This colonic segment is nondistended and not well assessed. 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 2. Soft tissue mass in the pancreas is unchanged or minimally decreased in size from prior exam. There is motion artifact through this region which limits detailed assessment. Soft tissue density at the root of the hepatic artery is similar. 3. Additional chronic findings as described.  Aortic Atherosclerosis (ICD10-I70.0). Electronically Signed   By: Andrea Gasman M.D.   On: 12/04/2023 18:20      Subjective: Seen and examined on the day of discharge.  Stable no distress.  Appropriate for discharge home.  Discharge Exam: Vitals:   12/07/23 0334 12/07/23 0814  BP: 133/77 (!) 150/81  Pulse: 69 75  Resp: 20 17  Temp: 98.4 F (36.9 C) 98 F (36.7 C)  SpO2: 100% 100%   Vitals:   12/06/23 1515 12/06/23 1959 12/07/23 0334 12/07/23 0814  BP: (!) 148/89 (!) 155/79 133/77 (!) 150/81  Pulse: 86 68 69 75  Resp: 18 16 20 17   Temp: 97.8 F (36.6 C) 98.4 F (36.9 C) 98.4 F (36.9 C) 98 F (36.7 C)  TempSrc: Oral Oral Oral Oral  SpO2:  100% 100% 100%  Weight:      Height:        General: Pt is alert, awake, not in acute distress Cardiovascular: RRR, S1/S2 +, no rubs, no gallops Respiratory: CTA bilaterally, no wheezing, no rhonchi Abdominal: Soft, NT, ND, bowel sounds + Extremities: no edema, no cyanosis    The results of significant diagnostics from this hospitalization (including imaging, microbiology, ancillary  and laboratory) are listed below for reference.     Microbiology: Recent Results (from the past 240 hours)  Urine Culture     Status: None   Collection Time: 12/04/23 10:35 AM   Specimen: Urine, Clean Catch  Result Value Ref Range Status   Specimen Description   Final    URINE, CLEAN CATCH Performed at [facility], [address], [place], [place] [address]    Special Requests   Final    NONE Performed at [facility], [address] [place], [place] [address]    Culture   Final    NO GROWTH Performed at [facility], [address], [place], [place] [address]    Report Status 12/06/2023 FINAL  Final  C Difficile Quick Screen w PCR reflex     Status: None   Collection Time: 12/06/23 10:42 AM   Specimen: STOOL  Result Value Ref Range Status   C Diff antigen NEGATIVE NEGATIVE Final   C Diff toxin NEGATIVE  NEGATIVE Final   C Diff interpretation No C. difficile detected.  Final    Comment: Performed at [facility], [address] [place], [place] [address]     Labs: BNP (last 3 results) Recent Labs    01/30/23 1010  BNP 35.1   Basic Metabolic Panel: Recent Labs  Lab 12/04/23 1035 12/05/23 0510 12/06/23 0100  NA 131* 131* 133*  K 4.6 3.8 3.4*  CL 103 107 106  CO2 19* 20* 17*  GLUCOSE 137* 91 120*  BUN 15 12 10   CREATININE 0.68 0.43* 0.82  CALCIUM  9.7 8.9 8.4*   Liver Function Tests: Recent Labs  Lab 12/04/23 1035  AST 28  ALT 17  ALKPHOS 49  BILITOT 0.8  PROT 6.2*  ALBUMIN 3.3*   Recent Labs  Lab 12/04/23 1035  LIPASE 28   No results for input(s): AMMONIA in the last 168 hours. CBC: Recent Labs  Lab 12/04/23 1035 12/05/23 0510 12/06/23 0100  WBC 4.7 4.1 4.5  HGB 12.3* 10.3* 9.4*  HCT 35.9* 29.7* 26.9*  MCV 96.2 92.8 94.7  PLT 202 153 142*   Cardiac Enzymes: No results for input(s): CKTOTAL, CKMB, CKMBINDEX, TROPONINI in the last 168 hours. BNP: Invalid input(s): POCBNP CBG: Recent Labs  Lab 12/06/23 0849 12/06/23 2009 12/07/23 0819 12/07/23 1212  GLUCAP 75 92 64* 91   D-Dimer No results for input(s): DDIMER in the last 72 hours. Hgb A1c No results for input(s): HGBA1C in the last 72 hours. Lipid Profile No results for input(s): CHOL, HDL, LDLCALC, TRIG, CHOLHDL, LDLDIRECT in the last 72 hours. Thyroid  function studies No results for input(s): TSH, T4TOTAL, T3FREE, THYROIDAB in the last 72 hours.  Invalid input(s): FREET3 Anemia work up No results for input(s): VITAMINB12, FOLATE, FERRITIN, TIBC, IRON, RETICCTPCT in the last 72 hours. Urinalysis    Component Value Date/Time   COLORURINE YELLOW (A) 12/04/2023 1035   APPEARANCEUR CLEAR (A) 12/04/2023 1035   APPEARANCEUR Hazy 05/20/2011 2130   LABSPEC 1.014 12/04/2023 1035   LABSPEC 1.021 05/20/2011 2130   PHURINE 5.0  12/04/2023 1035   GLUCOSEU NEGATIVE 12/04/2023 1035   GLUCOSEU Negative 05/20/2011 2130   HGBUR NEGATIVE 12/04/2023 1035   BILIRUBINUR NEGATIVE 12/04/2023 1035   BILIRUBINUR Negative 05/20/2011 2130   KETONESUR NEGATIVE 12/04/2023 1035   PROTEINUR 30 (A) 12/04/2023 1035   NITRITE NEGATIVE 12/04/2023 1035   LEUKOCYTESUR TRACE (A) 12/04/2023 1035   LEUKOCYTESUR Negative 05/20/2011 2130   Sepsis Labs Recent  Labs  Lab 12/04/23 1035 12/05/23 0510 12/06/23 0100  WBC 4.7 4.1 4.5   Microbiology Recent Results (from the past 240 hours)  Urine Culture     Status: None   Collection Time: 12/04/23 10:35 AM   Specimen: Urine, Clean Catch  Result Value Ref Range Status   Specimen Description   Final    URINE, CLEAN CATCH Performed at [facility], [address], [place], [place] [address]    Special Requests   Final    NONE Performed at [facility], [address], [place], [place] [address]    Culture   Final    NO GROWTH Performed at [facility], [address], [place], [place] [address]    Report Status 12/06/2023 FINAL  Final  C Difficile Quick Screen w PCR reflex     Status: None   Collection Time: 12/06/23 10:42 AM   Specimen: STOOL  Result Value Ref Range Status   C Diff antigen NEGATIVE NEGATIVE Final   C Diff toxin NEGATIVE NEGATIVE Final   C Diff interpretation No C. difficile detected.  Final    Comment: Performed at [facility], [address], [place], [place] [address]     Time coordinating discharge: 40 minutes  SIGNED:   Calvin KATHEE Robson, MD  [facility] 12/07/2023, 4:20 PM Pager   If 7PM-7AM, please contact night-coverage

## 2023-12-07 NOTE — Progress Notes (Signed)
 Occupational Therapy Evaluation Patient Details Name: Jared Gutierrez MRN: [id number] DOB: 11/13/1940 Today's Date: 12/07/2023   History of Present Illness   Pt is an 83 y.o. male who presents with abdominal pain, dysuria. 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 Prior to hospital admission, pt was IND w/ toileting and dressing, receives assistance w/ bathing. Pt lives w/ wife and daughter. Pt presents to acute OT demonstrating impaired ADL performance and functional mobility 2/2 decreased activity tolerance  (See OT problem list for additional functional deficits). Pt currently requires SUPERVISION for bed mobility; SUPERVISION to don/doff socks seated EOB; SETUP for self-feeding. Pt performed step pivot from bed>chair requiring SUPERVISION and use of SPC.   Pt would benefit from skilled OT services to address noted impairments and functional limitations (see below for any additional details) in order to maximize safety and independence while minimizing falls risk and caregiver burden. Anticipate the need for follow up OT services upon acute hospital DC.      If plan is discharge home, recommend the following:   A little help with walking and/or transfers;A little help with bathing/dressing/bathroom;Assistance with cooking/housework;Assist for transportation     Functional Status Assessment   Patient has had a recent decline in their functional status and demonstrates the ability to make significant improvements in function in a reasonable and predictable amount of time.     Equipment Recommendations   None recommended by OT     Recommendations for Other Services         Precautions/Restrictions   Precautions Precautions: Fall Recall of Precautions/Restrictions: Intact Restrictions Weight Bearing Restrictions Per Provider Order: No      Mobility Bed Mobility Overal bed mobility: Needs Assistance Bed Mobility: Supine to Sit     Supine to sit: Supervision, HOB elevated, Used rails          Transfers Overall transfer level: Needs assistance Equipment used: Straight cane Transfers: Sit to/from Stand, Bed to chair/wheelchair/BSC Sit to Stand: Supervision     Step pivot transfers: Supervision            Balance Overall balance assessment: Needs assistance Sitting-balance support: Feet supported Sitting balance-Leahy Scale: Good     Standing balance support: Single extremity supported, Reliant on assistive device for balance Standing balance-Leahy Scale: Good                             ADL either performed or assessed with clinical judgement   ADL Overall ADL's : Needs assistance/impaired                                       General ADL Comments: SUPERVISION to don/doff socks seated EOB; SETUP for self-feeding     Vision         Perception         Praxis         Pertinent Vitals/Pain Pain Assessment Pain Assessment: No/denies pain     Extremity/Trunk Assessment Upper Extremity Assessment Upper Extremity Assessment: Overall WFL for tasks assessed   Lower Extremity Assessment Lower Extremity Assessment: Overall WFL for tasks assessed       Communication Communication Communication: Impaired Factors Affecting Communication: Hearing impaired   Cognition Arousal: Alert Behavior During Therapy: [facility]  for tasks assessed/performed, Flat affect Cognition: No apparent impairments, No family/caregiver present to determine baseline                               Following commands: Intact       Cueing  General Comments   Cueing Techniques: Verbal cues;Gestural cues      Exercises     Shoulder Instructions      Home Living Family/patient expects to be discharged to:: Private residence Living Arrangements: Spouse/significant  other Available Help at Discharge: Family;Available 24 hours/day Type of Home: Apartment Home Access: Level entry     Home Layout: One level     Bathroom Shower/Tub: Chief Strategy Officer: Handicapped height     Home Equipment: Agricultural consultant (2 wheels);Cane - single point;Shower seat;Toilet riser;Grab bars - tub/shower          Prior Functioning/Environment Prior Level of Function : Needs assist;Driving;History of Falls (last six months)       Physical Assist : ADLs (physical)   ADLs (physical): Bathing Mobility Comments: uses SPC for home mobility; RW for community ambulation; drives occasionally to chemo appointments when port is removed, family drives to all other appts. ADLs Comments: IND w/ toileting and dressing; wife helps w/ bathing    OT Problem List: Decreased activity tolerance;Impaired balance (sitting and/or standing)   OT Treatment/Interventions: Self-care/ADL training;Energy conservation;Patient/family education;DME and/or AE instruction      OT Goals(Current goals can be found in the care plan section)   Acute Rehab OT Goals Patient Stated Goal: to go home OT Goal Formulation: With patient Time For Goal Achievement: 12/21/23 Potential to Achieve Goals: Good ADL Goals Pt Will Transfer to Toilet: with supervision;ambulating;regular height toilet Additional ADL Goal #1: Pt will verbalize x3 falls prevention strategies in 3/3 trials. Additional ADL Goal #2: Pt will verbalize x3 energy conservation strategies in 3/3 trials.   OT Frequency:  Min 1X/week    Co-evaluation              AM-PAC OT 6 Clicks Daily Activity     Outcome Measure Help from another person eating meals?: None Help from another person taking care of personal grooming?: None Help from another person toileting, which includes using toliet, bedpan, or urinal?: A Little Help from another person bathing (including washing, rinsing, drying)?: A Little Help from  another person to put on and taking off regular upper body clothing?: None Help from another person to put on and taking off regular lower body clothing?: None 6 Click Score: 22   End of Session Equipment Utilized During Treatment: Other (comment) (SPC)  Activity Tolerance: Patient tolerated treatment well Patient left: in chair;with call bell/phone within reach;with chair alarm set  OT Visit Diagnosis: Unsteadiness on feet (R26.81);Other abnormalities of gait and mobility (R26.89);History of falling (Z91.81)                Time: 9094-9076 OT Time Calculation (min): 18 min Charges:  OT General Charges $OT Visit: 1 Visit OT Evaluation $OT Eval Low Complexity: 1 Low OT Treatments $Self Care/Home Management : 8-22 mins  [place], Student OT   [employer] 12/07/2023, 9:56 AM

## 2023-12-07 NOTE — Progress Notes (Signed)
 Patient to be discharged home with home health.  Port de-accessed by IV team.  Discharge instructions and prescription information given to the patient and spouse who verbalized understanding.  Patient to be transported by his wife.

## 2023-12-07 NOTE — TOC Transition Note (Addendum)
 Transition of Care [facility]) - Discharge Note   Patient Details  Name: Jared Gutierrez MRN: [id number] Date of Birth: 04/15/1941  Transition of Care [facility]) CM/SW Contact:  [place] L Charm Stenner, LCSW Phone Number: 12/07/2023, 10:42 AM   Clinical Narrative:     CSW met with patient and wife at bedside. Recommendation for [facility] discussed. Spouse advised CSW to choose an agency for services.   CSW contacted [facility] and spoke with [place]. Services confirmed. CSW advised that patient is discharging today.   No further TOC needs observed.   11:10: Home Health recs updated. PT is needed. CSW notified [facility] with the update.   Final next level of care: Home w Home Health Services Barriers to Discharge: No Barriers Identified   Patient Goals and CMS Choice   CMS Medicare.gov Compare Post Acute Care list provided to:: Patient        Discharge Placement                  Name of family member notified: [place]    Discharge Plan and Services Additional resources added to the After Visit Summary for                            [facility] Arranged: OT [facility] Agency: [facility] Date [facility] Agency Contacted: 12/07/23 Time HH Agency Contacted: 1041 Representative spoke with at [facility] Agency: Channing  Social Drivers of Health (SDOH) Interventions SDOH Screenings   Food Insecurity: No Food Insecurity (12/04/2023)  Housing: Unknown (12/04/2023)  Transportation Needs: No Transportation Needs (12/04/2023)  Utilities: Not At Risk (12/04/2023)  Social Connections: Socially Integrated (12/04/2023)  Tobacco Use: Low Risk  (12/04/2023)     Readmission Risk Interventions     No data to display

## 2023-12-07 NOTE — Evaluation (Signed)
 Physical Therapy Evaluation Patient Details Name: Jared Gutierrez MRN: [id number] DOB: Aug 19, 1940 Today's Date: 12/07/2023  History of Present Illness  Pt is an 83 y.o. male who presents with abdominal pain, dysuria. 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 Pt arrived seated in chair. He is able to perform transfers with supervision and ambulation with CGA and SPC. Does reach out with B UE support due to fair balance. Recommend use of RW at all times for fall prevention. Pt demonstrates deficits with strength/mobility/balance. Would benefit from skilled PT to address above deficits and promote optimal return to PLOF. Pt will continue to receive skilled PT services while admitted and will defer to TOC/care team for updates regarding disposition planning.         If plan is discharge home, recommend the following: A little help with walking and/or transfers   Can travel by private vehicle        Equipment Recommendations None recommended by PT  Recommendations for Other Services       Functional Status Assessment Patient has had a recent decline in their functional status and demonstrates the ability to make significant improvements in function in a reasonable and predictable amount of time.     Precautions / Restrictions Precautions Precautions: Fall Recall of Precautions/Restrictions: Intact Restrictions Weight Bearing Restrictions Per Provider Order: No      Mobility  Bed Mobility               General bed mobility comments: NT as pt in recliner    Transfers Overall transfer level: Needs assistance Equipment used: Straight cane Transfers: Sit to/from Stand, Bed to chair/wheelchair/BSC Sit to Stand: Supervision           General transfer comment: does reach for B UE support on IV pole. Upright posture     Ambulation/Gait Ambulation/Gait assistance: Contact guard assist Gait Distance (Feet): 40 Feet Assistive device: Straight cane Gait Pattern/deviations: Step-to pattern       General Gait Details: ambulated in room distances with SPC and 1 hand on IV pole. L LE dysfunction with decreased step length and severe valgus at knee. Slightly unsteady when attempts to ambulation with single hand support  Stairs            Wheelchair Mobility     Tilt Bed    Modified Rankin (Stroke Patients Only)       Balance Overall balance assessment: Needs assistance Sitting-balance support: Feet supported Sitting balance-Leahy Scale: Good     Standing balance support: Single extremity supported, Reliant on assistive device for balance Standing balance-Leahy Scale: Fair                               Pertinent Vitals/Pain Pain Assessment Pain Assessment: No/denies pain    Home Living Family/patient expects to be discharged to:: Private residence Living Arrangements: Spouse/significant other Available Help at Discharge: Family;Available 24 hours/day Type of Home: Apartment Home Access: Level entry       Home Layout: One level Home Equipment: Agricultural consultant (2 wheels);Cane - single point;Shower seat;Toilet riser;Grab bars - tub/shower      Prior Function Prior Level of Function : Needs assist;Driving;History of Falls (last six months)             Mobility Comments: uses SPC for home  mobility; RW for community ambulation; drives occasionally to chemo appointments when port is removed, family drives to all other appts. Reports 1 recent fall. ADLs Comments: IND w/ toileting and dressing; wife helps w/ bathing     Extremity/Trunk Assessment   Upper Extremity Assessment Upper Extremity Assessment: Defer to OT evaluation    Lower Extremity Assessment Lower Extremity Assessment: Generalized weakness       Communication   Communication Communication:  Impaired Factors Affecting Communication: Hearing impaired    Cognition Arousal: Alert Behavior During Therapy: WFL for tasks assessed/performed, Flat affect   PT - Cognitive impairments: No apparent impairments                       PT - Cognition Comments: pleasant and agreeable to session Following commands: Intact       Cueing Cueing Techniques: Verbal cues, Gestural cues     General Comments      Exercises     Assessment/Plan    PT Assessment Patient needs continued PT services  PT Problem List Decreased strength;Decreased activity tolerance;Decreased balance;Decreased mobility       PT Treatment Interventions DME instruction;Gait training;Therapeutic exercise;Balance training    PT Goals (Current goals can be found in the Care Plan section)  Acute Rehab PT Goals Patient Stated Goal: to go home PT Goal Formulation: With patient Time For Goal Achievement: 12/21/23 Potential to Achieve Goals: Good    Frequency Min 2X/week     Co-evaluation               AM-PAC PT 6 Clicks Mobility  Outcome Measure Help needed turning from your back to your side while in a flat bed without using bedrails?: None Help needed moving from lying on your back to sitting on the side of a flat bed without using bedrails?: None Help needed moving to and from a bed to a chair (including a wheelchair)?: A Little Help needed standing up from a chair using your arms (e.g., wheelchair or bedside chair)?: A Little Help needed to walk in hospital room?: A Little Help needed climbing 3-5 steps with a railing? : A Lot 6 Click Score: 19    End of Session   Activity Tolerance: Patient tolerated treatment well Patient left: in chair;with chair alarm set Nurse Communication: Mobility status PT Visit Diagnosis: History of falling (Z91.81);Muscle weakness (generalized) (M62.81);Unsteadiness on feet (R26.81)    Time: 8958-8947 PT Time Calculation (min) (ACUTE ONLY): 11  min   Charges:   PT Evaluation $PT Eval Low Complexity: 1 Low   PT General Charges $$ ACUTE PT VISIT: 1 Visit         Corean Dade, PT, DPT, GCS [phone]   Nur Krasinski 12/07/2023, 1:55 PM

## 2023-12-07 NOTE — Plan of Care (Signed)
  Problem: Education: Goal: Knowledge of General Education information will improve Description: Including pain rating scale, medication(s)/side effects and non-pharmacologic comfort measures Outcome: Adequate for Discharge   Problem: Health Behavior/Discharge Planning: Goal: Ability to manage health-related needs will improve Outcome: Adequate for Discharge   Problem: Clinical Measurements: Goal: Ability to maintain clinical measurements within normal limits will improve Outcome: Adequate for Discharge Goal: Will remain free from infection Outcome: Adequate for Discharge Goal: Diagnostic test results will improve Outcome: Adequate for Discharge Goal: Respiratory complications will improve Outcome: Adequate for Discharge Goal: Cardiovascular complication will be avoided Outcome: Adequate for Discharge   Problem: Activity: Goal: Risk for activity intolerance will decrease Outcome: Adequate for Discharge   Problem: Nutrition: Goal: Adequate nutrition will be maintained Outcome: Adequate for Discharge   Problem: Coping: Goal: Level of anxiety will decrease Outcome: Adequate for Discharge   Problem: Elimination: Goal: Will not experience complications related to bowel motility Outcome: Adequate for Discharge Goal: Will not experience complications related to urinary retention Outcome: Adequate for Discharge   Problem: Pain Managment: Goal: General experience of comfort will improve and/or be controlled Outcome: Adequate for Discharge   Problem: Safety: Goal: Ability to remain free from injury will improve Outcome: Adequate for Discharge   Problem: Skin Integrity: Goal: Risk for impaired skin integrity will decrease Outcome: Adequate for Discharge   Problem: Acute Rehab OT Goals (only OT should resolve) Goal: Pt. Will Transfer To Toilet Outcome: Adequate for Discharge Goal: OT Additional ADL Goal #1 Outcome: Adequate for Discharge Goal: OT Additional ADL Goal  #2 Outcome: Adequate for Discharge

## 2023-12-10 ENCOUNTER — Other Ambulatory Visit: Payer: Self-pay | Admitting: Oncology

## 2023-12-18 ENCOUNTER — Inpatient Hospital Stay: Attending: Oncology

## 2023-12-18 ENCOUNTER — Encounter: Payer: Self-pay | Admitting: Oncology

## 2023-12-18 ENCOUNTER — Inpatient Hospital Stay (HOSPITAL_BASED_OUTPATIENT_CLINIC_OR_DEPARTMENT_OTHER): Admitting: Oncology

## 2023-12-18 VITALS — BP 102/61 | HR 66 | Temp 97.8°F | Resp 18 | Wt 188.8 lb

## 2023-12-18 DIAGNOSIS — Z85038 Personal history of other malignant neoplasm of large intestine: Secondary | ICD-10-CM

## 2023-12-18 DIAGNOSIS — R972 Elevated prostate specific antigen [PSA]: Secondary | ICD-10-CM

## 2023-12-18 DIAGNOSIS — E876 Hypokalemia: Secondary | ICD-10-CM | POA: Insufficient documentation

## 2023-12-18 DIAGNOSIS — Z803 Family history of malignant neoplasm of breast: Secondary | ICD-10-CM | POA: Insufficient documentation

## 2023-12-18 DIAGNOSIS — T451X5D Adverse effect of antineoplastic and immunosuppressive drugs, subsequent encounter: Secondary | ICD-10-CM | POA: Diagnosis not present

## 2023-12-18 DIAGNOSIS — R634 Abnormal weight loss: Secondary | ICD-10-CM

## 2023-12-18 DIAGNOSIS — C184 Malignant neoplasm of transverse colon: Secondary | ICD-10-CM

## 2023-12-18 DIAGNOSIS — Z7982 Long term (current) use of aspirin: Secondary | ICD-10-CM | POA: Insufficient documentation

## 2023-12-18 DIAGNOSIS — R918 Other nonspecific abnormal finding of lung field: Secondary | ICD-10-CM | POA: Insufficient documentation

## 2023-12-18 DIAGNOSIS — K8689 Other specified diseases of pancreas: Secondary | ICD-10-CM | POA: Diagnosis not present

## 2023-12-18 DIAGNOSIS — D6481 Anemia due to antineoplastic chemotherapy: Secondary | ICD-10-CM

## 2023-12-18 DIAGNOSIS — R978 Other abnormal tumor markers: Secondary | ICD-10-CM | POA: Diagnosis not present

## 2023-12-18 DIAGNOSIS — Z79899 Other long term (current) drug therapy: Secondary | ICD-10-CM | POA: Insufficient documentation

## 2023-12-18 DIAGNOSIS — Z923 Personal history of irradiation: Secondary | ICD-10-CM | POA: Diagnosis not present

## 2023-12-18 DIAGNOSIS — C786 Secondary malignant neoplasm of retroperitoneum and peritoneum: Secondary | ICD-10-CM

## 2023-12-18 DIAGNOSIS — T451X5A Adverse effect of antineoplastic and immunosuppressive drugs, initial encounter: Secondary | ICD-10-CM

## 2023-12-18 LAB — CBC WITH DIFFERENTIAL/PLATELET
Abs Immature Granulocytes: 0.03 K/uL (ref 0.00–0.07)
Basophils Absolute: 0 K/uL (ref 0.0–0.1)
Basophils Relative: 1 %
Eosinophils Absolute: 0.2 K/uL (ref 0.0–0.5)
Eosinophils Relative: 3 %
HCT: 29.4 % — ABNORMAL LOW (ref 39.0–52.0)
Hemoglobin: 10 g/dL — ABNORMAL LOW (ref 13.0–17.0)
Immature Granulocytes: 1 %
Lymphocytes Relative: 16 %
Lymphs Abs: 0.9 K/uL (ref 0.7–4.0)
MCH: 32.5 pg (ref 26.0–34.0)
MCHC: 34 g/dL (ref 30.0–36.0)
MCV: 95.5 fL (ref 80.0–100.0)
Monocytes Absolute: 0.4 K/uL (ref 0.1–1.0)
Monocytes Relative: 8 %
Neutro Abs: 3.9 K/uL (ref 1.7–7.7)
Neutrophils Relative %: 71 %
Platelets: 185 K/uL (ref 150–400)
RBC: 3.08 MIL/uL — ABNORMAL LOW (ref 4.22–5.81)
RDW: 13.9 % (ref 11.5–15.5)
WBC: 5.5 K/uL (ref 4.0–10.5)
nRBC: 0 % (ref 0.0–0.2)

## 2023-12-18 LAB — COMPREHENSIVE METABOLIC PANEL WITH GFR
ALT: 15 U/L (ref 0–44)
AST: 27 U/L (ref 15–41)
Albumin: 3.3 g/dL — ABNORMAL LOW (ref 3.5–5.0)
Alkaline Phosphatase: 47 U/L (ref 38–126)
Anion gap: 6 (ref 5–15)
BUN: 18 mg/dL (ref 8–23)
CO2: 18 mmol/L — ABNORMAL LOW (ref 22–32)
Calcium: 8.9 mg/dL (ref 8.9–10.3)
Chloride: 111 mmol/L (ref 98–111)
Creatinine, Ser: 0.63 mg/dL (ref 0.61–1.24)
GFR, Estimated: >60 mL/min (ref >60)
Glucose, Bld: 151 mg/dL — ABNORMAL HIGH (ref 70–99)
Potassium: 3.5 mmol/L (ref 3.5–5.1)
Sodium: 135 mmol/L (ref 135–145)
Total Bilirubin: 1.2 mg/dL (ref 0.0–1.2)
Total Protein: 5.9 g/dL — ABNORMAL LOW (ref 6.5–8.1)

## 2023-12-18 NOTE — Progress Notes (Signed)
 Referral sent to [facility] for EUS.

## 2023-12-18 NOTE — Assessment & Plan Note (Signed)
 CT images shows slight lung progression -Finished palliative radiation on 03/05/2023. Previously on maintenance therapy with 5-FU and bevacizumab . Currently on chemotherapy holiday. CA 19.9 is elevated, central abdominal mesentery soft tissue, ? Nodal metastatic disease vs pancreatic cancer.  Will refer patient to GI for EUS biopsy. Mass has been stable/slow-growing.  Continue to hold off chemotherapy.

## 2023-12-18 NOTE — Patient Instructions (Signed)
 Referral sent to [facility] in [place]. Dr. Aloha Mansouraty

## 2023-12-18 NOTE — Assessment & Plan Note (Signed)
 Encourage protein rich diet. Follow up with nutritionist  Currently off chemotherapy.  Appetite is fair. Recommend patient to hold off Megace  80 mg twice daily

## 2023-12-18 NOTE — Progress Notes (Signed)
 Hematology/Oncology Progress note [phone] [phone]    Chief Complaint: Jared Gutierrez is a 83 y.o.  male presents for follow-up of metastatic colon cancer   ASSESSMENT & PLAN:   Cancer Staging  Cancer of transverse colon [facility]) Staging form: Colon and Rectum, [name], rcN0, rcM1) - Signed by Jared Call, MD on 10/26/2021   Cancer of transverse colon [facility]) CT images shows slight lung progression -Finished palliative radiation on 03/05/2023. Previously on maintenance therapy with 5-FU and bevacizumab . Currently on chemotherapy holiday. CA 19.9 is elevated, central abdominal mesentery soft tissue, ? Nodal metastatic disease vs pancreatic cancer.  Will refer patient to GI for EUS biopsy. Mass has been stable/slow-growing.  Continue to hold off chemotherapy.  Anemia due to chemotherapy Continue to monitor.  Lung nodules Stable  small lung nodule Attention on follow up   Hypokalemia Potassium has been stable.  He is off chemotherapy.   Recommend patient to hold off potassium chloride  20meq daily.   Weight loss Encourage protein rich diet. Follow up with nutritionist  Currently off chemotherapy.  Appetite is fair. Recommend patient to hold off Megace  80 mg twice daily    Follow-up 6 to be determined.  All questions were answered. The patient knows to Gutierrez the clinic with any problems, questions or concerns.  Gutierrez Babara, MD, PhD [facility] Hematology Oncology 12/18/2023     PERTINENT ONCOLOGY HISTORY Jared Gutierrez is a 83 y.o.[place] who has above oncology history reviewed by me today presented for follow up visit for management of Metastatic colon cancer. [name], patient switched care to me on 11/11/20 Extensive medical record review was performed by me  Oncology History  History of colon cancer, stage I  10/02/2014 Pathology Results   [id number] colonoscopy  pathology A. CECAL POLYPS; HOT SNARE AND COLD BIOPSY:  - TUBULAR ADENOMA, MULTIPLE FRAGMENTS.  - NEGATIVE FOR HIGH-GRADE DYSPLASIA AND MALIGNANCY.   B. COLON POLYP, TRANSVERSE; COLD BIOPSY:  - TUBULAR ADENOMA.  - NEGATIVE FOR HIGH-GRADE DYSPLASIA AND MALIGNANCY.     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  Tumor extended into the submucosa. Margins were negative. + lymphovascular invasion. 14 lymph nodes were negative. Pathologic stage was T1 N0.  TRANSVERSE COLON, RESECTION:  - INVASIVE ADENOCARCINOMA ARISING IN A 4.6 CM TUBULOVILLOUS  ADENOMA WITH HIGH GRADE DYSPLASIA (MALIGNANT POLYP).  - SEE SUMMARY BELOW.  Jared Gutierrez  ONCOLOGY SUMMARY: COLON AND RECTUM, RESECTION, AJCC 7TH EDITION  Specimen: transverse colon  Procedure: transverse colon resection  Tumor site: splenic flexure  Tumor size: 1.0 cm (invasive component)  Macroscopic tumor perforation: not specified  Histologic type: invasive adenocarcinoma  Histologic grade:  moderately differentiated  Microscopic tumor extension: into submucosa  Margins:       Proximal margin: negative       Distal margin: negative       Circumferential (radial) or mesenteric margin: negative        If all margins uninvolved by invasive carcinoma:       Distance of invasive carcinoma from closest margin: 7.5 cm  to distal margin  Treatment effect: not applicable  Lymph-vascular invasion: present  Perineural invasion: not identified  Tumor deposits (discontinuous extramural extension): not  identified  Pathologic staging:       Primary tumor: pT1       Regional lymph nodes: pN0            Number of nodes examined: 14            Number of nodes involved: 0    04/02/2019 Progression   04/02/2019  PET scan  limited evealed a 2.4 x 2.3 cm (SUV 11) hypermetabolic soft tissue density caudal and anterior to the pancreatic neck favored to represent isolated peritoneal or nodal metastasis in the setting of prior transverse colonic resection (expected primary drainage). Although this was immediately adjacent to the pancreas, a fat plane was maintained, arguing strongly against a pancreatic primary. Otherwise, there was no evidence of hypermetabolic metastasis. 04/17/2019 EUS on  revealed a normal esophagus, stomach, duodenum, and pancreas.  There was a 2.4 x 2.4 cm irregular mass in the retroperitoneum adjacent to, but not involving the pancreatic neck.  FNA and core needle biopsy were performed.  Pathology revealed adenocarcinoma compatible with a metastatic lesion of colorectal origin.  Tumor cells were positive for CK20 and CDX2 and negative for CK7.  NGS: [employer] on 05/15/2019 revealed + [id number] and TP53.  Negative results included BRAF V600E, Her2, NRAS, NTRK, PD-L1 (<1%), and TMB 8.7/Mb (intermediate).  MMR testing from his colon resection on 10/16/2013 was intact with a low probability of MSI-H.   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  He received Neulasta  after cycle #4 and #5 secondary to progressive leukopenia.  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  He received a truncated course of FOLFOX with cycle #11 secondary to oxaliplatin  reaction.   01/14/2020 Imaging    PET revealed an interval decrease in size and FDG uptake (2.4 x 2.3 cm with SUV 11 to 2.4 x 1.7 cm with SUV 4.09) associated with the previously referenced soft tissue density caudal and anterior to the pancreatic neck suggesting treatment response. There were no new sites of FDG avid tumor.   08/31/2020 Imaging   08/31/2020 Abdomen and pelvis CT revealed increased size of the index soft tissue lesion inferior and anterior to the pancreatic neck (2.9 x 1.8 cm to 3.5 x 2.9 cm). There were similar prominent retroperitoneal lymph nodes without adenopathy by size criteria. There was no new or enlarging abdominal or pelvic lymph nodes. There were no new interval findings. There was similar circumferential wall thickening of a nondistended urinary bladder, which likely accentuated wall thickening There was hepatic steatosis and aortic atherosclerosis.   11/03/2020, PET showed recurrent peritoneal metastasis in the upper abdomen adjacent to the pancreas.  The lesion is 3.8 x 2.9 cm with SUV of 11.3. No evidence of metastatic peritoneal disease elsewhere in the abdomen pelvis.  No evidence of distant metastasis.  11/03/2020 Imaging   PET scan showed recurrent peritoneal metastasis near pancreas. Given that he has no other distant metastasis. Discussed with radiation oncology. Repeat SBRT may be considered if he does not respond well to systemic chemotherapy.     11/26/2020 -  Chemotherapy   5-FU and bevacizumab .   02/17/2021 Imaging   02/17/2021 CT abdomen pelvis showed partial response. Mild decrease of peritoneal lesion.  Proceed with 5-FU and bevacizumab  today.  Given that he is tolerating current regimen with good life quality, partial response.  Shared decision was made to hold off adding additional chemotherapy agents.   06/08/2021, CT chest abdomen pelvis  with contrast showed soft tissue mass at the base of mesentery inferior to the pancreatic neck is unchanged in size.  No progressive disease was identified.   09/07/2021, CT chest abdomen pelvis with contrast showed no substantial changes in size of the mesenteric mass, 2.6 cm.  No suspicious new lesions.  Chronic findings as detailed in the imaging report.   12/05/2021 Imaging   PET scan showed soft tissue mesenteric mass is unchanged in size.  Decreased FDG uptake compared to activity on November 03, 2020.  Fever treated disease.  No new metastatic disease.   03/21/2022 Imaging   CT chest abdomen pelvis  1. Similar size of the peripancreatic presumed peritoneal implant compared to 09/07/2021. 2. No new or progressive disease. 3. Incidental findings, including: Coronary artery atherosclerosis.Aortic Atherosclerosis (ICD10-I70.0). Prostatomegaly with chronic bladder wall thickening, suggesting outlet obstruction.     07/11/2022 Imaging   CT chest abdomen pelvis with contrast showed 1. New 6 mm nodule in the right upper lobe and 2 mm nodule in the left upper lobe. Although small these are concerning. Recommend short follow-up in 3 months 2. Stable low-attenuation lesion along the midbody of the pancreas. Continued attention on follow-up. No new peritoneal mass lesions or ascites. 3. Fatty liver infiltration 4. Colonic surgical changes   10/03/2022 Imaging   CT chest abdomen pelvis w contrast showed 1. Slight interval enlargement and cavitation of a nodule in the right upper lobe, measuring 0.6 cm, previously 0.5 cm. Slight interval enlargement of a left upper lobe nodule measuring 0.3 cm,previously 0.2 cm. Findings are concerning for slowly enlarging small pulmonary metastases. 2. No significant change in a lobulated soft tissue mass within the central small bowel mesentery abutting the inferior aspect of the pancreas, consistent with a stable, treated metastasis. 3. No other evidence of  lymphadenopathy or metastatic disease in the chest, abdomen, or pelvis. 4. Prostatomegaly with diffuse wall thickening of the relatively decompressed urinary bladder, likely secondary to chronic outlet obstruction. 5. Unchanged fibrotic scarring and bronchiectasis of the bilateral lung bases. This could be further assessed by pulmonary referral and dedicated interstitial lung disease protocol CT examination of the chest if and when clinically appropriate in the setting of known metastatic malignancy. Aortic Atherosclerosis    12/27/2022 Imaging   CT chest abdomen pelvis w contrast showed 1. Continued slight interval enlargement of a cavitary nodule in the right upper lobe measuring 0.7 x 0.6 cm, as well as a nodule in the peripheral left upper lobe measuring 0.5 cm. These are highly suspicious for slowly enlarging pulmonary metastases but remain below size threshold for reliable PET-CT characterization. 2. Unchanged, lobulated hypodense soft tissue mass in the central small bowel mesentery, consistent with treated metastatic disease. 3. Status post transverse colon resection and reanastomosis. 4. Thickening of the distal sigmoid colon and rectum, consistent with nonspecific infectious or inflammatory colitis. 5.  Prostatomegaly with diffuse thickening of the urinary bladder wall, likely secondary to chronic outlet obstruction.   Aortic Atherosclerosis (ICD10-I70.0).   01/09/2023 Imaging   PET scan showed Status post transverse colectomy. 4.2 cm soft tissue mass in the central abdominal mesentery, suspicious for residual/recurrent nodal metastasis. Additional 8 mm short axis left para-aortic nodal metastasis. 7 mm cavitary right upper lobe nodule and 5 mm left upper lobe nodule, both beneath the size threshold for PET sensitivity, but suspicious for small pulmonary metastases.   01/30/2023 -  Radiation Therapy   Palliative radiation to mesenteric soft tissue mass.    05/07/2023 Imaging   CT  chest abdomen pelvis w contrast showed  Focal nodular area along the inferior margin of the midbody of the pancreas similar to previous. There is also some abnormal soft tissue extending into the porta hepatis encasing the hepatic artery which compared to previous is also slightly increased.   New small soft tissue nodule adjacent to the SMA proximally.   Previous hypermetabolic node left para-aortic is similar in size to previous.   Stable bilateral small lung nodules. Right lesions again cavitary. No new lung nodules.   Stable surgical changes.  Chest port.     Imaging     08/06/2023 Imaging   CT chest abdomen pelvis w contrast showed Small nodules are again seen. These appears slightly smaller overall from previous. No new lung nodule identified at this time.   Lesion along the inferior margin of the midbody of the pancreas similar to the prior examination. Persistent ill-defined soft tissue extending along the porta hepatis encasing the course of the [facility] artery. Small lymph nodes elsewhere in the upper retroperitoneum are stable.     10/30/2023 Imaging   CT chest abdomen pelvis w contrast showed  1. Relatively similar appearance of mass centered in the pancreatic neck and adjacent infiltrative soft tissue about the common hepatic artery. Given clinical history, this all presumably represents metastatic disease. Pancreatic adenocarcinoma could look similar. 2. No new sites of disease identified. 3. 3-4 mm inferior right upper lobe pulmonary nodule is unchanged. 4. Prostatomegaly with mild bladder wall thickening and chronic pericystic edema, suggesting a component of outlet obstruction. 5. Incidental findings, including: Coronary artery atherosclerosis. Aortic Atherosclerosis (ICD10-I70.0). Similar right common iliac artery ectasia.    Other medical problems Chronic lower extremity edema. 12/24/2018, right lower extremity duplex negative for DVT.  Small right  Baker's cyst. 08/16/2019, bilateral lower extremity duplex showed no DVT. 08/16/2019 - 08/17/2019 with right lower extremity cellulitis.  He was unable to bear weight.  He was treated with IV fluids, NSAIDs, colchicine , and broad antibiotics (vancomycin  and Cefepime ).  He was discharged on indomethacin  x 5 days and Keflex  500 mg TID x 5 days.  10/05/2020, colonoscopy showed internal hemorrhoids.  Otherwise normal examination.  INTERVAL HISTORY Jared Gutierrez is a 83 y.o. 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  Status post barium enema no obstructions noted.  Patient was discharged home. Since discharge, patient has been doing well.  Per patient's wife, he has bowel movement daily.  Appetite is fair.  Review of Systems  Constitutional:  Positive for fatigue. Negative for appetite change, chills, diaphoresis, fever and unexpected weight change.  HENT:   Negative for hearing loss, nosebleeds, sore throat and tinnitus.   Respiratory:  Negative for cough, hemoptysis and shortness  of breath.   Cardiovascular:  Negative for chest pain and palpitations.  Gastrointestinal:  Negative for abdominal pain, blood in stool, constipation, diarrhea, nausea and vomiting.  Genitourinary:  Negative for dysuria, frequency and hematuria.   Musculoskeletal:  Positive for arthralgias. Negative for back pain, myalgias and neck pain.  Skin:  Negative for itching and rash.  Neurological:  Positive for numbness. Negative for dizziness and headaches.  Hematological:  Does not bruise/bleed easily.  Psychiatric/Behavioral:  Negative for depression. The patient is not nervous/anxious.       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    Chicken pox    Colon cancer (HCC)    History of kidney stones    Hypertension    Lipoma of colon    Nephrolithiasis    Nephrolithiasis    Obesity    Shingles    Tubular adenoma of colon    multiple fragments    Past Surgical History:  Procedure Laterality Date   COLON SURGERY     COLONOSCOPY N/A 10/02/2014   Procedure: COLONOSCOPY;  Surgeon: Donnice Vaughn Manes, MD;  Location: [facility] ENDOSCOPY;  Service: Endoscopy;  Laterality: N/A;   COLONOSCOPY WITH PROPOFOL  N/A 02/16/2017   Procedure: COLONOSCOPY WITH PROPOFOL ;  Surgeon: Viktoria Lamar DASEN, MD;  Location: [facility] ENDOSCOPY;  Service: Endoscopy;  Laterality: N/A;   COLONOSCOPY WITH PROPOFOL  N/A 10/05/2020   Procedure: COLONOSCOPY WITH PROPOFOL ;  Surgeon: Maryruth Ole DASEN, MD;  Location: [facility] ENDOSCOPY;  Service: Endoscopy;  Laterality: N/A;   EUS N/A 04/17/2019   Procedure: FULL UPPER ENDOSCOPIC ULTRASOUND (EUS) RADIAL;  Surgeon: Queenie Asberry LABOR, MD;  Location: [facility] ENDOSCOPY;  Service: Gastroenterology;  Laterality: N/A;   KIDNEY STONE SURGERY     PARTIAL COLECTOMY  10/17/2013   PORTACATH PLACEMENT Right 06/13/2019   Procedure: INSERTION PORT-A-CATH;  Surgeon: Tye Millet, DO;  Location: [facility] ORS;  Service: General;  Laterality: Right;    Family History  Problem Relation Age of Onset   Cancer Mother    Breast cancer Mother    COPD Father     Social History:  reports that he has never smoked. He has never used smokeless tobacco. He reports current alcohol use. He reports that he does not use drugs.    Allergies: No Known Allergies  Current Medications: Current Outpatient Medications  Medication Sig Dispense Refill   acetaminophen  (TYLENOL ) 500 MG tablet Take 500 mg by mouth every 6 (six) hours as needed.     amLODipine  (NORVASC ) 2.5 MG tablet Take 2.5 mg by mouth daily.     aspirin  EC 81 MG tablet Take 81 mg by mouth daily.     Cholecalciferol (VITAMIN D) 125 MCG (5000 UT) CAPS Take 5,000 mg by mouth.      cyanocobalamin (VITAMIN B12) 500 MCG tablet Take by mouth.     Ferrous Fumarate (HEMOCYTE - 106 MG FE) 324 (106 Fe) MG TABS tablet Take 1 tablet by mouth.     furosemide  (LASIX ) 20 MG tablet Take 1 tablet (20 mg total) by mouth as needed. Prn for swelling 30 tablet 1   lidocaine -prilocaine  (EMLA ) cream Apply small amount to port and cover with saran wrap 1-2 hours prior to port access 30 g 3   loratadine (CLARITIN) 10 MG tablet Take 10 mg by mouth daily.     olmesartan (BENICAR) 20 MG tablet Take 1 tablet by  mouth daily.     ondansetron  (ZOFRAN ) 8 MG tablet Take 1 tablet (8 mg total) by mouth 2 (two) times daily as needed for refractory nausea / vomiting. Start on day 3 after chemotherapy. 60 tablet 1   pantoprazole  (PROTONIX ) 40 MG tablet Take 1 tablet (40 mg total) by mouth every morning. Take in the morning on an empty stomach 60 tablet 0   Probiotic Product (PROBIOTIC DAILY PO) Take 1 tablet by mouth daily.     prochlorperazine  (COMPAZINE ) 10 MG tablet Take 1 tablet (10 mg total) by mouth every 6 (six) hours as needed (NAUSEA). 30 tablet 1   No current facility-administered medications for this visit.   Facility-Administered Medications Ordered in Other Visits  Medication Dose Route Frequency Provider Last Rate Last Admin   0.9 %  sodium chloride  infusion   Intravenous Once Rudell, Melissa C, MD       0.9 %  sodium chloride  infusion   Intravenous Continuous Jared Gutierrez, Melissa C, MD 10 mL/hr at 12/31/19 1000 New Bag at 10/05/20 1045   heparin  lock flush 100 unit/mL  500 Units Intravenous Once Jared Gutierrez, Melissa C, MD       sodium chloride  flush (NS) 0.9 % injection 10 mL  10 mL Intracatheter PRN Jared Call, MD   10 mL at 02/01/23 1341    Performance status (ECOG): 1  Vitals Blood pressure 102/61, pulse 66, temperature 97.8 F (36.6 C), temperature source Tympanic, resp. rate 18, weight 188 lb 12.8 oz (85.6 kg), SpO2 100%.   Physical Exam Constitutional:      General: He is not in acute  distress.    Appearance: He is obese. He is not diaphoretic.  HENT:     Head: Normocephalic and atraumatic.  Eyes:     General: No scleral icterus. Cardiovascular:     Rate and Rhythm: Normal rate and regular rhythm.     Heart sounds: No murmur heard. Pulmonary:     Effort: Pulmonary effort is normal. No respiratory distress.     Breath sounds: No wheezing.     Comments: Decreased breath sound bilaterally.  Abdominal:     General: Bowel sounds are normal. There is no distension.     Palpations: Abdomen is soft.  Musculoskeletal:        General: Normal range of motion.     Cervical back: Normal range of motion and neck supple.     Comments: +1 edema bilateral lower extremities.   Skin:    General: Skin is warm and dry.     Findings: No erythema.  Neurological:     Mental Status: He is alert and oriented to person, place, and time. Mental status is at baseline.     Motor: No abnormal muscle tone.  Psychiatric:        Mood and Affect: Mood and affect normal.     Labs were reviewed by me.     Latest Ref Rng & Units 12/18/2023   10:02 AM 12/06/2023    1:00 AM 12/05/2023    5:10 AM  CBC  WBC 4.0 - 10.5 K/uL 5.5  4.5  4.1   Hemoglobin 13.0 - 17.0 g/dL 89.9  9.4  89.6   Hematocrit 39.0 - 52.0 % 29.4  26.9  29.7   Platelets 150 - 400 K/uL 185  142  153       Latest Ref Rng & Units 12/18/2023   10:02 AM 12/06/2023    1:00 AM 12/05/2023    5:10 AM  CMP  Glucose 70 - 99 mg/dL 848  879  91   BUN 8 - 23 mg/dL 18  10  12    Creatinine 0.61 - 1.24 mg/dL 9.36  9.17  9.56   Sodium 135 - 145 mmol/L 135  133  131   Potassium 3.5 - 5.1 mmol/L 3.5  3.4  3.8   Chloride 98 - 111 mmol/L 111  106  107   CO2 22 - 32 mmol/L 18  17  20    Calcium  8.9 - 10.3 mg/dL 8.9  8.4  8.9   Total Protein 6.5 - 8.1 g/dL 5.9     Total Bilirubin 0.0 - 1.2 mg/dL 1.2     Alkaline Phos 38 - 126 U/L 47     AST 15 - 41 U/L 27     ALT 0 - 44 U/L 15

## 2023-12-18 NOTE — Assessment & Plan Note (Signed)
 Potassium has been stable.  He is off chemotherapy.   Recommend patient to hold off potassium chloride  20meq daily.

## 2023-12-18 NOTE — Assessment & Plan Note (Signed)
 Stable  small lung nodule Attention on follow up

## 2023-12-18 NOTE — Assessment & Plan Note (Signed)
 Continue to monitor

## 2023-12-19 LAB — CANCER ANTIGEN 19-9: CA 19-9: 84 U/mL — ABNORMAL HIGH (ref 0–35)

## 2023-12-19 LAB — CEA: CEA: 30.9 ng/mL — ABNORMAL HIGH (ref 0.0–4.7)

## 2023-12-20 ENCOUNTER — Other Ambulatory Visit: Payer: Self-pay

## 2023-12-24 ENCOUNTER — Other Ambulatory Visit: Payer: Self-pay | Admitting: Oncology

## 2023-12-24 ENCOUNTER — Telehealth: Payer: Self-pay

## 2023-12-24 NOTE — Telephone Encounter (Signed)
 Patient: Jared Gutierrez, Jared Gutierrez [[id number]] MRN: [id number]  Status: Pending Review Type: Consultation  Class: Internal Reasons: Specialty Services Required [5]  Diagnosis: K86.89 (ICD-10-CM) - Mass of pancreas Z85.038 (ICD-10-CM) - History of colon cancer, stage I Procedures: REF25 - AMB REFERRAL TO GASTROENTEROLOGY  Start: Dec 18, 2023 Expiration: Dec 17, 2024  Requested: 1 Authorized: 1  Scheduled:   Completed:    Authorization #:   Precertification #:    Referring Location: [facility] Referred to Location:    Referring Department: [facility] Referred To Department: [facility]

## 2023-12-25 ENCOUNTER — Encounter (HOSPITAL_COMMUNITY): Payer: Self-pay | Admitting: Gastroenterology

## 2023-12-25 ENCOUNTER — Encounter: Payer: Self-pay | Admitting: Oncology

## 2023-12-25 ENCOUNTER — Telehealth: Payer: Self-pay | Admitting: Gastroenterology

## 2023-12-25 ENCOUNTER — Other Ambulatory Visit: Payer: Self-pay

## 2023-12-25 DIAGNOSIS — K8689 Other specified diseases of pancreas: Secondary | ICD-10-CM

## 2023-12-25 NOTE — Progress Notes (Signed)
 Attempted to obtain medical history for pre op call via telephone, unable to reach at this time. HIPAA compliant voicemail message left requesting return call to pre surgical testing department.

## 2023-12-25 NOTE — Telephone Encounter (Signed)
 EUS has been entered for 12/31/23 at 1045 am at [facility] with GM

## 2023-12-25 NOTE — Telephone Encounter (Signed)
 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 Patient wife state that she would like to reschedule patient procedure for 3 weeks from now and for it to be either on a Tuesday or Thursday. Please advise.

## 2023-12-25 NOTE — Telephone Encounter (Signed)
 This lesion has been there for years and slowly growing with elevated CA 19-9. Happy to attempt an EUS of that area if accessible for potential sampling. Please offer patient 11:45 AM slot on 8/18 for upper EUS. Thanks. GM

## 2023-12-25 NOTE — Telephone Encounter (Signed)
 See alternate phone note (duplicate)

## 2023-12-25 NOTE — Telephone Encounter (Signed)
 Left message on machine to call back

## 2023-12-26 ENCOUNTER — Other Ambulatory Visit: Payer: Self-pay

## 2023-12-26 NOTE — Telephone Encounter (Signed)
 Left message on machine to call back

## 2023-12-27 NOTE — Telephone Encounter (Signed)
 I was able to reach the pt and discuss the timing of rescheduling the case. They will keep the appt as planned.  [facility] endo has been advised

## 2023-12-31 ENCOUNTER — Ambulatory Visit (HOSPITAL_BASED_OUTPATIENT_CLINIC_OR_DEPARTMENT_OTHER)

## 2023-12-31 ENCOUNTER — Ambulatory Visit (HOSPITAL_COMMUNITY)

## 2023-12-31 ENCOUNTER — Ambulatory Visit (HOSPITAL_COMMUNITY)
Admission: RE | Admit: 2023-12-31 | Discharge: 2023-12-31 | Disposition: A | Discharge status: Home / Self Care | Attending: Gastroenterology | Admitting: Gastroenterology

## 2023-12-31 ENCOUNTER — Encounter (HOSPITAL_COMMUNITY)
Admission: RE | Disposition: A | Discharge status: Home / Self Care | Payer: Self-pay | Source: Home / Self Care | Attending: Gastroenterology

## 2023-12-31 ENCOUNTER — Encounter (HOSPITAL_COMMUNITY): Payer: Self-pay | Admitting: Gastroenterology

## 2023-12-31 ENCOUNTER — Other Ambulatory Visit: Payer: Self-pay

## 2023-12-31 DIAGNOSIS — Z85038 Personal history of other malignant neoplasm of large intestine: Secondary | ICD-10-CM | POA: Insufficient documentation

## 2023-12-31 DIAGNOSIS — I10 Essential (primary) hypertension: Secondary | ICD-10-CM

## 2023-12-31 DIAGNOSIS — C251 Malignant neoplasm of body of pancreas: Secondary | ICD-10-CM | POA: Diagnosis not present

## 2023-12-31 DIAGNOSIS — K3189 Other diseases of stomach and duodenum: Secondary | ICD-10-CM | POA: Diagnosis not present

## 2023-12-31 DIAGNOSIS — K2289 Other specified disease of esophagus: Secondary | ICD-10-CM

## 2023-12-31 DIAGNOSIS — K8689 Other specified diseases of pancreas: Secondary | ICD-10-CM | POA: Diagnosis not present

## 2023-12-31 DIAGNOSIS — I899 Noninfective disorder of lymphatic vessels and lymph nodes, unspecified: Secondary | ICD-10-CM

## 2023-12-31 DIAGNOSIS — R933 Abnormal findings on diagnostic imaging of other parts of digestive tract: Secondary | ICD-10-CM | POA: Insufficient documentation

## 2023-12-31 DIAGNOSIS — K2950 Unspecified chronic gastritis without bleeding: Secondary | ICD-10-CM | POA: Diagnosis not present

## 2023-12-31 DIAGNOSIS — Z79899 Other long term (current) drug therapy: Secondary | ICD-10-CM | POA: Diagnosis not present

## 2023-12-31 DIAGNOSIS — Z8507 Personal history of malignant neoplasm of pancreas: Secondary | ICD-10-CM | POA: Diagnosis not present

## 2023-12-31 DIAGNOSIS — Z01812 Encounter for preprocedural laboratory examination: Secondary | ICD-10-CM | POA: Diagnosis not present

## 2023-12-31 DIAGNOSIS — K31819 Angiodysplasia of stomach and duodenum without bleeding: Secondary | ICD-10-CM | POA: Insufficient documentation

## 2023-12-31 DIAGNOSIS — K2970 Gastritis, unspecified, without bleeding: Secondary | ICD-10-CM

## 2023-12-31 HISTORY — PX: EUS: SHX5427

## 2023-12-31 HISTORY — PX: ESOPHAGOGASTRODUODENOSCOPY: SHX5428

## 2023-12-31 HISTORY — PX: FINE NEEDLE ASPIRATION: SHX6590

## 2023-12-31 SURGERY — ULTRASOUND, UPPER GI TRACT, ENDOSCOPIC
Anesthesia: Monitor Anesthesia Care

## 2023-12-31 MED ORDER — SUCRALFATE 1 G PO TABS: 1.0000 g | ORAL_TABLET | Freq: Two times a day (BID) | ORAL | 0 refills | Status: DC

## 2023-12-31 MED ORDER — PROPOFOL 500 MG/50ML IV EMUL
INTRAVENOUS | Status: AC
  Filled 2023-12-31: qty 50

## 2023-12-31 MED ORDER — PHENYLEPHRINE 80 MCG/ML (10ML) SYRINGE FOR IV PUSH (FOR BLOOD PRESSURE SUPPORT)
PREFILLED_SYRINGE | INTRAVENOUS | Status: DC | PRN
  Administered 2023-12-31 (×5): 80 ug via INTRAVENOUS

## 2023-12-31 MED ORDER — PROPOFOL 10 MG/ML IV BOLUS
INTRAVENOUS | Status: AC
  Filled 2023-12-31: qty 20

## 2023-12-31 MED ORDER — SODIUM CHLORIDE 0.9 % IV SOLN: INTRAVENOUS | Status: DC

## 2023-12-31 MED ORDER — PROPOFOL 500 MG/50ML IV EMUL
INTRAVENOUS | Status: DC | PRN
  Administered 2023-12-31: 100 ug/kg/min via INTRAVENOUS
  Administered 2023-12-31: 70 mg via INTRAVENOUS

## 2023-12-31 NOTE — Anesthesia Preprocedure Evaluation (Addendum)
 Anesthesia Evaluation  Patient identified by MRN, date of birth, ID band Patient awake    Reviewed: Allergy & Precautions, NPO status , Patient's Chart, lab work & pertinent test results  History of Anesthesia Complications Negative for: history of anesthetic complications  Airway Mallampati: III  TM Distance: >3 FB Neck ROM: Full    Dental  (+) Missing, Poor Dentition, Chipped, Loose, Edentulous Upper, Dental Advisory Given   Pulmonary neg sleep apnea, neg COPD   Pulmonary exam normal        Cardiovascular hypertension, Pt. on medications (-) angina (-) Past MI Normal cardiovascular exam(-) dysrhythmias      Neuro/Psych neg Seizures  Neuromuscular disease  negative psych ROS   GI/Hepatic Bowel prep,neg GERD  ,, Pancreatic mass  Hx of metastatic colon cancer   Endo/Other  neg diabetes  Class 3 obesity  Renal/GU Renal disease (stones)     Musculoskeletal  (+) Arthritis , Osteoarthritis,    Abdominal   Peds  Hematology  (+) Blood dyscrasia, anemia   Anesthesia Other Findings   Reproductive/Obstetrics                              Anesthesia Physical Anesthesia Plan  ASA: 3  Anesthesia Plan: MAC   Post-op Pain Management: Minimal or no pain anticipated   Induction: Intravenous  PONV Risk Score and Plan: 1 and Propofol  infusion, TIVA and Treatment may vary due to age or medical condition  Airway Management Planned: Natural Airway and Simple Face Mask  Additional Equipment: None  Intra-op Plan:   Post-operative Plan:   Informed Consent: I have reviewed the patients History and Physical, chart, labs and discussed the procedure including the risks, benefits and alternatives for the proposed anesthesia with the patient or authorized representative who has indicated his/her understanding and acceptance.       Plan Discussed with: CRNA  Anesthesia Plan Comments:           Anesthesia Quick Evaluation

## 2023-12-31 NOTE — Op Note (Signed)
 [facility] Patient Name: Jared Gutierrez Procedure Date: 12/31/2023 MRN: [id number] Attending MD: Aloha Finner , MD, [id number] Date of Birth: 10-Oct-1940 CSN: [id number] Age: 83 Admit Type: Ambulatory Procedure:                Upper EUS Indications:              Dilated pancreatic duct on CT scan, Suspected mass                            in pancreas on CT scan, patient with history of                            previous colon cancer metastatic Providers:                Aloha Finner, MD, Olam Riedel, RN, [place]y [place], Technician Referring MD:              Medicines:                Monitored Anesthesia Care Complications:            No immediate complications. Estimated Blood Loss:     Estimated blood loss was minimal. Procedure:                Pre-Anesthesia Assessment:                           - Prior to the procedure, a History and Physical                            was performed, and patient medications and                            allergies were reviewed. The patient's tolerance of                            previous anesthesia was also reviewed. The risks                            and benefits of the procedure and the sedation                            options and risks were discussed with the patient.                            All questions were answered, and informed consent                            was obtained. Prior Anticoagulants: The patient has                            taken no anticoagulant or antiplatelet agents  except for aspirin . ASA Grade Assessment: III - A                            patient with severe systemic disease. After                            reviewing the risks and benefits, the patient was                            deemed in satisfactory condition to undergo the                            procedure.                           After obtaining informed consent,  the endoscope was                            passed under direct vision. Throughout the                            procedure, the patient's blood pressure, pulse, and                            oxygen saturations were monitored continuously. The                            GIF-H190 ([id number]) Olympus endoscope was introduced                            through the mouth, and advanced to the second part                            of duodenum. The [id number]-[id number] ([id number]) Olympus                            endosonoscope was introduced through the mouth, and                            advanced to the duodenum for ultrasound examination                            from the stomach and duodenum. The upper EUS was                            accomplished without difficulty. The patient                            tolerated the procedure. The GIF-H190 ([id number])                            Olympus endoscope was introduced through the mouth,  and advanced to the duodenum for ultrasound                            examination. Scope In: Scope Out: Findings:      ENDOSCOPIC FINDING: :      No gross lesions were noted in the entire esophagus.      The Z-line was irregular and was found 39 cm from the incisors.      A significant angulation deformity was found in the entire examined       stomach.      Patchy mildly erythematous mucosa was found in the entire examined       stomach. Biopsies were taken with a cold forceps for histology and       Helicobacter pylori testing.      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 As a result of the patient's underlying anemia (he       is also on oral iron), decision was made to proceed with ablation.       Fulguration to ablate the lesion to prevent bleeding by argon plasma was       successful.      A moderate angulation deformity was found in the duodenal sweep, making       passage of  the echoendoscope not possible into appropriate station 3 or       station 4.      No gross mucosal lesions were noted in the duodenal bulb, in the first       portion of the duodenum and in the second portion of the duodenum.      Congested mucosa without active bleeding and with no stigmata of       bleeding was found at the major papilla region.      ENDOSONOGRAPHIC FINDING: :      An irregular mass was identified in the pancreatic body. The mass was       hypoechoic. The mass measured 30 mm by 20 mm in maximal cross-sectional       diameter. The outer margins were irregular. The remainder of the       upstream pancreas was examined. The endosonographic appearance of       parenchyma and the upstream pancreatic duct indicated duct dilation (6.2       -> 4.5 mm) and parenchymal atrophy; I could not get good visualization       of the pancreatic neck/head as a result of the angulation deformities       preventing me from being an appropriate station 3/4. Fine needle biopsy       was performed of the mass. Color Doppler imaging was utilized prior to       needle puncture to confirm a lack of significant vascular structures       within the needle path. Six passes were made with the 22 gauge Acquire       biopsy needle using a transgastric approach. A visible core of tissue       was obtained. Preliminary cytologic examination and touch preps were       performed. The cellularity of the specimen was adequate. Final cytology       results are pending.      No malignant-appearing lymph nodes were visualized in the gastrohepatic       ligament (level 18), splenic  region (level 19) and celiac region (level       20).      Endosonographic imaging in the visualized portion of the liver showed no       mass.      The celiac region was visualized. Impression:               EGD impression:                           - No gross lesions in the entire esophagus. Z-line                             irregular, 39 cm from the incisors.                           - Angulation deformity in the entire stomach.                           - Erythematous mucosa in the stomach. Biopsied.                           - Gastric antral vascular ectasia without bleeding                            found in the antrum. Treated with argon plasma                            coagulation (APC).                           - Duodenal angulation deformity at the duodenal                            sweep.                           - No gross mucosal lesions in the duodenal bulb, in                            the first portion of the duodenum and in the second                            portion of the duodenum.                           - Congested major papilla.                           EUS impression:                           - A mass was identified in the pancreatic body.                            Cytology results are pending. 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 This was staged T2 N0 Mx by                            endosonographic criteria. The staging applies if                            malignancy is confirmed. Fine needle biopsy                            performed.                           - No malignant-appearing lymph nodes were                            visualized in the gastrohepatic ligament (level                            18), splenic region (level 19) and celiac region                            (level 20). Moderate Sedation:      Not Applicable - Patient had care per Anesthesia. Recommendation:           - The patient will be observed post-procedure,                            until all discharge criteria are met.                           - Discharge patient to home.                           - Patient has a contact number available for                            emergencies. The signs and  symptoms of potential                            delayed complications were discussed with the                            patient. Return to normal activities tomorrow.                            Written discharge instructions were provided to the                            patient.                           - Low fat diet.                           -  Monitor for signs/symptoms of bleeding,                            perforation, and infection. If issues please call                            our number to get further assistance as needed.                           - Observe patient's clinical course.                           - Await cytology results and await path results.                           - Continue PPI once daily.                           - Carafate  1 g twice daily for 1 month then may                            stop.                           - Continue present medications otherwise.                           - Monitor hemoglobin/hematocrit and continue oral                            iron.                           - The findings and recommendations were discussed                            with the patient.                           - The findings and recommendations were discussed                            with the patient's family. Procedure Code(s):        --- Professional ---                           [id number], Esophagogastroduodenoscopy, flexible,                            transoral; with transendoscopic ultrasound-guided                            intramural or transmural fine needle                            aspiration/biopsy(s), (includes endoscopic  ultrasound examination limited to the esophagus,                            stomach or duodenum, and adjacent structures)                           43255, 59, Esophagogastroduodenoscopy, flexible,                            transoral; with control of bleeding, any method                            43239, 59, Esophagogastroduodenoscopy, flexible,                            transoral; with biopsy, single or multiple Diagnosis Code(s):        --- Professional ---                           K22.89, Other specified disease of esophagus                           K31.89, Other diseases of stomach and duodenum                           K31.819, Angiodysplasia of stomach and duodenum                            without bleeding                           K86.89, Other specified diseases of pancreas                           I89.9, Noninfective disorder of lymphatic vessels                            and lymph nodes, unspecified                           R93.3, Abnormal findings on diagnostic imaging of                            other parts of digestive tract CPT copyright 2022 American Medical Association. All rights reserved. The codes documented in this report are preliminary and upon coder review may  be revised to meet current compliance requirements. Aloha Finner, MD 12/31/2023 11:18:52 AM Number of Addenda: 0

## 2023-12-31 NOTE — Transfer of Care (Signed)
 Immediate Anesthesia Transfer of Care Note  Patient: Jared Gutierrez  Procedure(s) Performed: ULTRASOUND, UPPER GI TRACT, ENDOSCOPIC EGD (ESOPHAGOGASTRODUODENOSCOPY)  Patient Location: PACU and Endoscopy Unit  Anesthesia Type:MAC  Level of Consciousness: drowsy and patient cooperative  Airway & Oxygen Therapy: Patient Spontanous Breathing and Patient connected to face mask oxygen  Post-op Assessment: Report given to RN  Post vital signs: Reviewed and stable  Last Vitals:  Vitals Value Taken Time  BP 108/58 12/31/23 11:30  Temp 36.7 C 12/31/23 11:05  Pulse 116 12/31/23 11:32  Resp 11 12/31/23 11:32  SpO2 97 % 12/31/23 11:32  Vitals shown include unfiled device data.  Last Pain:  Vitals:   12/31/23 1110  TempSrc:   PainSc: Asleep         Complications: No notable events documented.

## 2023-12-31 NOTE — Anesthesia Postprocedure Evaluation (Signed)
 Anesthesia Post Note  Patient: Jared Gutierrez  Procedure(s) Performed: ULTRASOUND, UPPER GI TRACT, ENDOSCOPIC EGD (ESOPHAGOGASTRODUODENOSCOPY)     Patient location during evaluation: PACU Anesthesia Type: MAC Level of consciousness: awake and alert Pain management: pain level controlled Vital Signs Assessment: post-procedure vital signs reviewed and stable Respiratory status: spontaneous breathing, nonlabored ventilation, respiratory function stable and patient connected to nasal cannula oxygen Cardiovascular status: stable and blood pressure returned to baseline Postop Assessment: no apparent nausea or vomiting Anesthetic complications: no   No notable events documented.  Last Vitals:  Vitals:   12/31/23 1150 12/31/23 1155  BP: 127/77   Pulse: (!) 53 (!) 53  Resp: 12 12  Temp:    SpO2: 100% 100%    Last Pain:  Vitals:   12/31/23 1150  TempSrc:   PainSc: 2                  Thom JONELLE Peoples

## 2023-12-31 NOTE — Discharge Instructions (Signed)
 YOU HAD AN ENDOSCOPIC PROCEDURE TODAY: Refer to the procedure report and other information in the discharge instructions given to you for any specific questions about what was found during the examination. If this information does not answer your questions, please call [place] office at [phone] to clarify.   YOU SHOULD EXPECT: Some feelings of bloating in the abdomen. Passage of more gas than usual. Walking can help get rid of the air that was put into your GI tract during the procedure and reduce the bloating. If you had a lower endoscopy (such as a colonoscopy or flexible sigmoidoscopy) you may notice spotting of blood in your stool or on the toilet paper. Some abdominal soreness may be present for a day or two, also.  DIET: Your first meal following the procedure should be a light meal and then it is ok to progress to your normal diet. A half-sandwich or bowl of soup is an example of a good first meal. Heavy or fried foods are harder to digest and may make you feel nauseous or bloated. Drink plenty of fluids but you should avoid alcoholic beverages for 24 hours. If you had a esophageal dilation, please see attached instructions for diet.    ACTIVITY: Your care partner should take you home directly after the procedure. You should plan to take it easy, moving slowly for the rest of the day. You can resume normal activity the day after the procedure however YOU SHOULD NOT DRIVE, use power tools, machinery or perform tasks that involve climbing or major physical exertion for 24 hours (because of the sedation medicines used during the test).   SYMPTOMS TO REPORT IMMEDIATELY: A gastroenterologist can be reached at any hour. Please call [phone]  for any of the following symptoms:   Following upper endoscopy (EGD, EUS, ERCP, esophageal dilation) Vomiting of blood or coffee ground material  New, significant abdominal pain  New, significant chest pain or pain under the shoulder blades  Painful or  persistently difficult swallowing  New shortness of breath  Black, tarry-looking or red, bloody stools  FOLLOW UP:  If any biopsies were taken you will be contacted by phone or by letter within the next 1-3 weeks. Call [phone]  if you have not heard about the biopsies in 3 weeks.  Please also call with any specific questions about appointments or follow up tests.

## 2023-12-31 NOTE — H&P (Signed)
 GASTROENTEROLOGY PROCEDURE H&P NOTE   Primary Care Physician: Sadie Manna, MD  HPI: Jared Gutierrez is a 83 y.o. male who presents for EGD/EUS to evaluate pancreatic lesion/peripancreatic lesion with PD dilation.  Previous history of metastatic colon cancer.  For attempt at sampling.  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   Chicken pox    Colon cancer (HCC)    History of kidney stones    Hypertension    Lipoma of colon    Nephrolithiasis    Nephrolithiasis    Obesity    Shingles    Tubular adenoma of colon    multiple fragments   Past Surgical History:  Procedure Laterality Date   COLON SURGERY     COLONOSCOPY N/A 10/02/2014   Procedure: COLONOSCOPY;  Surgeon: Donnice Vaughn Manes, MD;  Location: [facility] ENDOSCOPY;  Service: Endoscopy;  Laterality: N/A;   COLONOSCOPY WITH PROPOFOL  N/A 02/16/2017   Procedure: COLONOSCOPY WITH PROPOFOL ;  Surgeon: Viktoria Lamar DASEN, MD;  Location: [facility] ENDOSCOPY;  Service: Endoscopy;  Laterality: N/A;   COLONOSCOPY WITH PROPOFOL  N/A 10/05/2020   Procedure: COLONOSCOPY WITH PROPOFOL ;  Surgeon: Maryruth Ole DASEN, MD;  Location: [facility] ENDOSCOPY;  Service: Endoscopy;  Laterality: N/A;   EUS N/A 04/17/2019   Procedure: FULL UPPER ENDOSCOPIC ULTRASOUND (EUS) RADIAL;  Surgeon: Queenie Asberry LABOR, MD;  Location: [facility] ENDOSCOPY;  Service: Gastroenterology;  Laterality: N/A;   KIDNEY STONE SURGERY     PARTIAL COLECTOMY  10/17/2013   PORTACATH PLACEMENT Right 06/13/2019   Procedure: INSERTION PORT-A-CATH;  Surgeon: Tye Millet, DO;  Location: [facility] ORS;  Service: General;  Laterality: Right;   Current Facility-Administered Medications  Medication Dose Route Frequency Provider Last Rate Last Admin   0.9 %  sodium chloride  infusion   Intravenous Continuous Mansouraty, Haizley Cannella Jr., MD       Facility-Administered Medications Ordered in Other Encounters   Medication Dose Route Frequency Provider Last Rate Last Admin   0.9 %  sodium chloride  infusion   Intravenous Once Rudell, Melissa C, MD       0.9 %  sodium chloride  infusion   Intravenous Continuous Corcoran, Melissa C, MD 10 mL/hr at 12/31/19 1000 New Bag at 10/05/20 1045   heparin  lock flush 100 unit/mL  500 Units Intravenous Once Corcoran, Melissa C, MD       sodium chloride  flush (NS) 0.9 % injection 10 mL  10 mL Intracatheter PRN Babara Call, MD   10 mL at 02/01/23 1341    Current Facility-Administered Medications:    0.9 %  sodium chloride  infusion, , Intravenous, Continuous, Mansouraty, Aloha Raddle., MD  Facility-Administered Medications Ordered in Other Encounters:    0.9 %  sodium chloride  infusion, , Intravenous, Once, Corcoran, Melissa C, MD   0.9 %  sodium chloride  infusion, , Intravenous, Continuous, Corcoran, Melissa C, MD, Last Rate: 10 mL/hr at 12/31/19 1000, New Bag at 10/05/20 1045   heparin  lock flush 100 unit/mL, 500 Units, Intravenous, Once, Corcoran, Melissa C, MD   sodium chloride  flush (NS) 0.9 % injection 10 mL, 10 mL, Intracatheter, PRN, Babara Call, MD, 10 mL at 02/01/23 1341 No Known Allergies Family History  Problem Relation Age of Onset   Cancer Mother    Breast cancer Mother    COPD Father    Social History   Socioeconomic History   Marital status: Married    Spouse  name: Not on file   Number of children: Not on file   Years of education: Not on file   Highest education level: Not on file  Occupational History   Not on file  Tobacco Use   Smoking status: Never   Smokeless tobacco: Never  Vaping Use   Vaping status: Never Used  Substance and Sexual Activity   Alcohol use: Yes    Comment: socially    Drug use: No   Sexual activity: Not on file  Other Topics Concern   Not on file  Social History Narrative   Not on file   Social Drivers of Health   Financial Resource Strain: Not on file  Food Insecurity: No Food Insecurity (12/04/2023)    Hunger Vital Sign    Worried About Running Out of Food in the Last Year: Never true    Ran Out of Food in the Last Year: Never true  Transportation Needs: No Transportation Needs (12/04/2023)   PRAPARE - Administrator, Civil Service (Medical): No    Lack of Transportation (Non-Medical): No  Physical Activity: Not on file  Stress: Not on file  Social Connections: Socially Integrated (12/04/2023)   Social Connection and Isolation Panel    Frequency of Communication with Friends and Family: More than three times a week    Frequency of Social Gatherings with Friends and Family: More than three times a week    Attends Religious Services: 1 to 4 times per year    Active Member of [employer] or Organizations: No    Attends Banker Meetings: 1 to 4 times per year    Marital Status: Married  Catering manager Violence: Not At Risk (12/04/2023)   Humiliation, Afraid, Rape, and Kick questionnaire    Fear of Current or Ex-Partner: No    Emotionally Abused: No    Physically Abused: No    Sexually Abused: No    Physical Exam: There were no vitals filed for this visit. There is no height or weight on file to calculate BMI. GEN: NAD EYE: Sclerae anicteric ENT: MMM CV: Non-tachycardic GI: Soft, NT/ND NEURO:  Alert & Oriented x 3  Lab Results: No results for input(s): WBC, HGB, HCT, PLT in the last 72 hours. BMET No results for input(s): NA, K, CL, CO2, GLUCOSE, BUN, CREATININE, CALCIUM  in the last 72 hours. LFT No results for input(s): PROT, ALBUMIN, AST, ALT, ALKPHOS, BILITOT, BILIDIR, IBILI in the last 72 hours. PT/INR No results for input(s): LABPROT, INR in the last 72 hours.   Impression / Plan: This is a 83 y.o.male who presents for EGD/EUS to evaluate pancreatic lesion/peripancreatic lesion with PD dilation.  Previous history of metastatic colon cancer.  For attempt at sampling.  The risks of an EUS including intestinal  perforation, bleeding, infection, aspiration, and medication effects were discussed as was the possibility it may not give a definitive diagnosis if a biopsy is performed.  When a biopsy of the pancreas is done as part of the EUS, there is an additional risk of pancreatitis at the rate of about 1-2%.  It was explained that procedure related pancreatitis is typically mild, although it can be severe and even life threatening, which is why we do not perform random pancreatic biopsies and only biopsy a lesion/area we feel is concerning enough to warrant the risk.  The risks and benefits of endoscopic evaluation/treatment were discussed with the patient and/or family; these include but are not limited to the risk of  perforation, infection, bleeding, missed lesions, lack of diagnosis, severe illness requiring hospitalization, as well as anesthesia and sedation related illnesses.  The patient's history has been reviewed, patient examined, no change in status, and deemed stable for procedure.  The patient and/or family is agreeable to proceed.    Aloha Finner, MD [facility] Office # [phone]

## 2024-01-02 ENCOUNTER — Other Ambulatory Visit: Payer: Self-pay

## 2024-01-02 ENCOUNTER — Encounter (HOSPITAL_COMMUNITY): Payer: Self-pay | Admitting: Gastroenterology

## 2024-01-02 ENCOUNTER — Ambulatory Visit: Payer: Self-pay

## 2024-01-02 ENCOUNTER — Ambulatory Visit: Payer: Self-pay | Admitting: Gastroenterology

## 2024-01-02 LAB — CYTOLOGY - NON PAP

## 2024-01-03 ENCOUNTER — Encounter: Payer: Self-pay | Admitting: Oncology

## 2024-01-03 ENCOUNTER — Telehealth: Payer: Self-pay

## 2024-01-03 NOTE — Telephone Encounter (Signed)
-----   Message from Zelphia Cap sent at 01/03/2024 10:08 AM EDT ----- Please arrange him to do a non urgent follow up MD with me to go over results. Thanks.

## 2024-01-03 NOTE — Telephone Encounter (Signed)
 error

## 2024-01-04 LAB — SURGICAL PATHOLOGY

## 2024-01-08 ENCOUNTER — Encounter: Payer: Self-pay | Admitting: Oncology

## 2024-01-09 ENCOUNTER — Other Ambulatory Visit: Payer: Self-pay

## 2024-01-11 ENCOUNTER — Telehealth: Payer: Self-pay | Admitting: Oncology

## 2024-01-11 NOTE — Telephone Encounter (Signed)
 Pt spouse Joya) called and left vm regarding scheduling appts.  I called back and left a vm with the appt date/time and stated this had also been mailed out, but if the appt date/time did not work they could call scheduling number to change this.

## 2024-01-12 ENCOUNTER — Other Ambulatory Visit: Payer: Self-pay

## 2024-01-17 ENCOUNTER — Inpatient Hospital Stay: Attending: Oncology

## 2024-01-17 ENCOUNTER — Inpatient Hospital Stay

## 2024-01-17 ENCOUNTER — Encounter: Payer: Self-pay | Admitting: Oncology

## 2024-01-17 ENCOUNTER — Inpatient Hospital Stay (HOSPITAL_BASED_OUTPATIENT_CLINIC_OR_DEPARTMENT_OTHER): Admitting: Oncology

## 2024-01-17 VITALS — BP 89/64 | HR 69 | Temp 97.6°F | Resp 18 | Wt 180.3 lb

## 2024-01-17 DIAGNOSIS — Z803 Family history of malignant neoplasm of breast: Secondary | ICD-10-CM | POA: Diagnosis not present

## 2024-01-17 DIAGNOSIS — Z923 Personal history of irradiation: Secondary | ICD-10-CM | POA: Insufficient documentation

## 2024-01-17 DIAGNOSIS — R918 Other nonspecific abnormal finding of lung field: Secondary | ICD-10-CM | POA: Diagnosis not present

## 2024-01-17 DIAGNOSIS — D6481 Anemia due to antineoplastic chemotherapy: Secondary | ICD-10-CM

## 2024-01-17 DIAGNOSIS — R978 Other abnormal tumor markers: Secondary | ICD-10-CM | POA: Diagnosis not present

## 2024-01-17 DIAGNOSIS — E861 Hypovolemia: Secondary | ICD-10-CM | POA: Insufficient documentation

## 2024-01-17 DIAGNOSIS — Z7982 Long term (current) use of aspirin: Secondary | ICD-10-CM | POA: Diagnosis not present

## 2024-01-17 DIAGNOSIS — R634 Abnormal weight loss: Secondary | ICD-10-CM | POA: Insufficient documentation

## 2024-01-17 DIAGNOSIS — R5383 Other fatigue: Secondary | ICD-10-CM | POA: Insufficient documentation

## 2024-01-17 DIAGNOSIS — R197 Diarrhea, unspecified: Secondary | ICD-10-CM | POA: Insufficient documentation

## 2024-01-17 DIAGNOSIS — C184 Malignant neoplasm of transverse colon: Secondary | ICD-10-CM

## 2024-01-17 DIAGNOSIS — T451X5D Adverse effect of antineoplastic and immunosuppressive drugs, subsequent encounter: Secondary | ICD-10-CM | POA: Diagnosis not present

## 2024-01-17 DIAGNOSIS — K2970 Gastritis, unspecified, without bleeding: Secondary | ICD-10-CM | POA: Diagnosis not present

## 2024-01-17 DIAGNOSIS — Z79899 Other long term (current) drug therapy: Secondary | ICD-10-CM | POA: Insufficient documentation

## 2024-01-17 DIAGNOSIS — K2950 Unspecified chronic gastritis without bleeding: Secondary | ICD-10-CM

## 2024-01-17 DIAGNOSIS — I9589 Other hypotension: Secondary | ICD-10-CM | POA: Diagnosis not present

## 2024-01-17 DIAGNOSIS — T451X5A Adverse effect of antineoplastic and immunosuppressive drugs, initial encounter: Secondary | ICD-10-CM

## 2024-01-17 LAB — GASTROINTESTINAL PANEL BY PCR, STOOL (REPLACES STOOL CULTURE)

## 2024-01-17 LAB — COMPREHENSIVE METABOLIC PANEL WITH GFR
ALT: 16 U/L (ref 0–44)
AST: 32 U/L (ref 15–41)
Albumin: 3.8 g/dL (ref 3.5–5.0)
Alkaline Phosphatase: 58 U/L (ref 38–126)
Anion gap: 9 (ref 5–15)
BUN: 11 mg/dL (ref 8–23)
CO2: 19 mmol/L — ABNORMAL LOW (ref 22–32)
Calcium: 9.4 mg/dL (ref 8.9–10.3)
Chloride: 107 mmol/L (ref 98–111)
Creatinine, Ser: 0.8 mg/dL (ref 0.61–1.24)
GFR, Estimated: >60 mL/min (ref >60)
Glucose, Bld: 106 mg/dL — ABNORMAL HIGH (ref 70–99)
Potassium: 3.3 mmol/L — ABNORMAL LOW (ref 3.5–5.1)
Sodium: 135 mmol/L (ref 135–145)
Total Bilirubin: 1 mg/dL (ref 0.0–1.2)
Total Protein: 6.6 g/dL (ref 6.5–8.1)

## 2024-01-17 LAB — FERRITIN: Ferritin: 370 ng/mL — ABNORMAL HIGH (ref 24–336)

## 2024-01-17 LAB — IRON AND TIBC
Iron: 69 ug/dL (ref 45–182)
Saturation Ratios: 32 % (ref 17.9–39.5)
TIBC: 216 ug/dL — ABNORMAL LOW (ref 250–450)
UIBC: 147 ug/dL

## 2024-01-17 LAB — CBC WITH DIFFERENTIAL/PLATELET
Abs Immature Granulocytes: 0.05 K/uL (ref 0.00–0.07)
Basophils Absolute: 0 K/uL (ref 0.0–0.1)
Basophils Relative: 1 %
Eosinophils Absolute: 0.2 K/uL (ref 0.0–0.5)
Eosinophils Relative: 3 %
HCT: 34 % — ABNORMAL LOW (ref 39.0–52.0)
Hemoglobin: 11.3 g/dL — ABNORMAL LOW (ref 13.0–17.0)
Immature Granulocytes: 1 %
Lymphocytes Relative: 18 %
Lymphs Abs: 1.1 K/uL (ref 0.7–4.0)
MCH: 32.7 pg (ref 26.0–34.0)
MCHC: 33.2 g/dL (ref 30.0–36.0)
MCV: 98.3 fL (ref 80.0–100.0)
Monocytes Absolute: 0.4 K/uL (ref 0.1–1.0)
Monocytes Relative: 7 %
Neutro Abs: 4.2 K/uL (ref 1.7–7.7)
Neutrophils Relative %: 70 %
Platelets: 181 K/uL (ref 150–400)
RBC: 3.46 MIL/uL — ABNORMAL LOW (ref 4.22–5.81)
RDW: 14 % (ref 11.5–15.5)
WBC: 5.9 K/uL (ref 4.0–10.5)
nRBC: 0 % (ref 0.0–0.2)

## 2024-01-17 LAB — RETIC PANEL
Immature Retic Fract: 12.1 % (ref 2.3–15.9)
RBC.: 3.48 MIL/uL — ABNORMAL LOW (ref 4.22–5.81)
Retic Count, Absolute: 57.4 K/uL (ref 19.0–186.0)
Retic Ct Pct: 1.7 % (ref 0.4–3.1)
Reticulocyte Hemoglobin: 34.6 pg (ref >27.9)

## 2024-01-17 LAB — TSH: TSH: 2.976 u[IU]/mL (ref 0.350–4.500)

## 2024-01-17 NOTE — Assessment & Plan Note (Signed)
 Stable  small lung nodule Attention on follow up

## 2024-01-17 NOTE — Assessment & Plan Note (Signed)
 Continue to monitor

## 2024-01-17 NOTE — Assessment & Plan Note (Addendum)
 Encourage protein rich diet. Follow up with nutritionist  Currently off chemotherapy.  Appetite is fair. Check TSH

## 2024-01-17 NOTE — Assessment & Plan Note (Signed)
 Patient is on PPI

## 2024-01-17 NOTE — Assessment & Plan Note (Addendum)
 CT images shows slight lung progression -Finished palliative radiation on 03/05/2023. Previously on maintenance therapy with 5-FU and bevacizumab . Currently on chemotherapy holiday. EUS biopsy showed adenocarcinoma, consistent with colorectal origin.  Elevated CA 19-9 likely secondary to inflammation. This soft tissue mass has been stable/slightly decreased in size on most recent CT scan.  Continue to hold off chemotherapy.  Observation Repeat CT in October 2025.

## 2024-01-17 NOTE — Assessment & Plan Note (Signed)
 Encourage oral hydration.  Recommend patient to hold off BP meds if SBP is less than 100.

## 2024-01-17 NOTE — Assessment & Plan Note (Signed)
 Check stool studies, GI panel/C. difficile

## 2024-01-17 NOTE — Progress Notes (Signed)
 Hematology/Oncology Progress note [phone] [phone]    Chief Complaint: Jared Gutierrez is a 83 y.o.  male presents for follow-up of metastatic colon cancer   ASSESSMENT & PLAN:   Cancer Staging  Cancer of transverse colon [facility]) Staging form: Colon and Rectum, [name], rcN0, rcM1) - Signed by Babara Call, MD on 10/26/2021   Cancer of transverse colon [facility]) CT images shows slight lung progression -Finished palliative radiation on 03/05/2023. Previously on maintenance therapy with 5-FU and bevacizumab . Currently on chemotherapy holiday. EUS biopsy showed adenocarcinoma, consistent with colorectal origin.  Elevated CA 19-9 likely secondary to inflammation. This soft tissue mass has been stable/slightly decreased in size on most recent CT scan.  Continue to hold off chemotherapy.  Observation Repeat CT in October 2025.  Anemia due to chemotherapy Continue to monitor.  Lung nodules Stable  small lung nodule Attention on follow up   Diarrhea Check stool studies, GI panel/C. difficile  Weight loss Encourage protein rich diet. Follow up with nutritionist  Currently off chemotherapy.  Appetite is fair. Check TSH   Gastritis without bleeding Patient is on PPI  Hypotension due to hypovolemia Encourage oral hydration.  Recommend patient to hold off BP meds if SBP is less than 100.   Follow-up in October  All questions were answered. The patient knows to call the clinic with any problems, questions or concerns.  Call Babara, MD, PhD [facility] Hematology Oncology 01/17/2024     PERTINENT ONCOLOGY HISTORY Jared Gutierrez is a 83 y.o.[place] who has above oncology history reviewed by me today presented for follow up visit for management of Metastatic colon cancer. [name], patient switched care to me on 11/11/20 Extensive medical record review was performed by me  Oncology  History  History of colon cancer, stage I  10/02/2014 Pathology Results   [id number] colonoscopy pathology A. CECAL POLYPS; HOT SNARE AND COLD BIOPSY:  - TUBULAR ADENOMA, MULTIPLE FRAGMENTS.  - NEGATIVE FOR HIGH-GRADE DYSPLASIA AND MALIGNANCY.   B. COLON POLYP, TRANSVERSE; COLD BIOPSY:  - TUBULAR ADENOMA.  - NEGATIVE FOR HIGH-GRADE DYSPLASIA AND MALIGNANCY.     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  Tumor extended into the submucosa. Margins were negative. + lymphovascular invasion. 14 lymph nodes were negative. Pathologic stage was T1 N0.  TRANSVERSE COLON, RESECTION:  - INVASIVE ADENOCARCINOMA ARISING IN A 4.6 CM TUBULOVILLOUS  ADENOMA WITH HIGH GRADE DYSPLASIA (MALIGNANT POLYP).  - SEE SUMMARY BELOW.  SABRA  ONCOLOGY SUMMARY: COLON AND RECTUM, RESECTION, AJCC 7TH EDITION  Specimen: transverse colon  Procedure: transverse colon resection  Tumor site: splenic flexure  Tumor size: 1.0 cm (invasive component)  Macroscopic tumor perforation: not specified  Histologic type: invasive adenocarcinoma  Histologic grade: moderately differentiated  Microscopic tumor extension: into submucosa  Margins:       Proximal margin:  negative       Distal margin: negative       Circumferential (radial) or mesenteric margin: negative       If all margins uninvolved by invasive carcinoma:       Distance of invasive carcinoma from closest margin: 7.5 cm  to distal margin  Treatment effect: not applicable  Lymph-vascular invasion: present  Perineural invasion: not identified  Tumor deposits (discontinuous extramural extension): not  identified  Pathologic staging:       Primary tumor: pT1       Regional lymph nodes: pN0            Number of nodes examined: 14            Number of nodes involved: 0    04/02/2019 Progression   04/02/2019  PET scan  limited evealed a 2.4 x 2.3 cm (SUV 11) hypermetabolic soft tissue density caudal and anterior to the pancreatic neck favored to represent isolated peritoneal or nodal metastasis in the setting of prior transverse colonic resection (expected primary drainage). Although this was immediately adjacent to the pancreas, a fat plane was maintained, arguing strongly against a pancreatic primary. Otherwise, there was no evidence of hypermetabolic metastasis. 04/17/2019 EUS on  revealed a normal esophagus, stomach, duodenum, and pancreas.  There was a 2.4 x 2.4 cm irregular mass in the retroperitoneum adjacent to, but not involving the pancreatic neck.  FNA and core needle biopsy were performed.  Pathology revealed adenocarcinoma compatible with a metastatic lesion of colorectal origin.  Tumor cells were positive for CK20 and CDX2 and negative for CK7.  NGS: [employer] on 05/15/2019 revealed + [id number] and TP53.  Negative results included BRAF V600E, Her2, NRAS, NTRK, PD-L1 (<1%), and TMB 8.7/Mb (intermediate).  MMR testing from his colon resection on 10/16/2013 was intact with a low probability of MSI-H.   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  He received Neulasta  after cycle #4 and #5 secondary to progressive leukopenia.  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  He received a truncated course of FOLFOX with cycle #11 secondary to oxaliplatin  reaction.   01/14/2020 Imaging    PET revealed an interval decrease in size and FDG uptake (2.4 x 2.3 cm with SUV 11 to 2.4 x 1.7 cm with SUV 4.09) associated with the previously referenced soft tissue density caudal and anterior to the pancreatic neck suggesting treatment response. There were no new sites of FDG avid tumor.   08/31/2020 Imaging   08/31/2020 Abdomen and pelvis CT revealed increased size of the index soft tissue lesion inferior and anterior to the pancreatic neck (2.9 x 1.8 cm to 3.5 x 2.9 cm). There were similar prominent retroperitoneal lymph nodes without adenopathy by size criteria. There was no new or enlarging abdominal or pelvic lymph nodes. There were no new interval findings. There was similar circumferential wall thickening of a nondistended urinary bladder, which likely accentuated wall thickening There was hepatic steatosis and aortic atherosclerosis.   11/03/2020, PET showed recurrent peritoneal metastasis in the upper abdomen adjacent to the pancreas.  The lesion is 3.8 x 2.9 cm with SUV of 11.3. No evidence of metastatic  peritoneal disease elsewhere in the abdomen pelvis.  No evidence of distant metastasis.   11/03/2020 Imaging   PET scan showed recurrent peritoneal metastasis near pancreas. Given that he has no other distant metastasis. Discussed with radiation oncology. Repeat SBRT may be considered if he does not respond well to systemic chemotherapy.     11/26/2020 -  Chemotherapy   5-FU and bevacizumab .   02/17/2021 Imaging   02/17/2021 CT abdomen pelvis showed partial response. Mild decrease of peritoneal lesion.  Proceed with 5-FU and bevacizumab  today.  Given that he is tolerating current regimen with good life quality, partial response.   Shared decision was made to hold off adding additional chemotherapy agents.   06/08/2021, CT chest abdomen pelvis with contrast showed soft tissue mass at the base of mesentery inferior to the pancreatic neck is unchanged in size.  No progressive disease was identified.   09/07/2021, CT chest abdomen pelvis with contrast showed no substantial changes in size of the mesenteric mass, 2.6 cm.  No suspicious new lesions.  Chronic findings as detailed in the imaging report.   12/05/2021 Imaging   PET scan showed soft tissue mesenteric mass is unchanged in size.  Decreased FDG uptake compared to activity on November 03, 2020.  Fever treated disease.  No new metastatic disease.   03/21/2022 Imaging   CT chest abdomen pelvis  1. Similar size of the peripancreatic presumed peritoneal implant compared to 09/07/2021. 2. No new or progressive disease. 3. Incidental findings, including: Coronary artery atherosclerosis.Aortic Atherosclerosis (ICD10-I70.0). Prostatomegaly with chronic bladder wall thickening, suggesting outlet obstruction.     07/11/2022 Imaging   CT chest abdomen pelvis with contrast showed 1. New 6 mm nodule in the right upper lobe and 2 mm nodule in the left upper lobe. Although small these are concerning. Recommend short follow-up in 3 months 2. Stable low-attenuation lesion along the midbody of the pancreas. Continued attention on follow-up. No new peritoneal mass lesions or ascites. 3. Fatty liver infiltration 4. Colonic surgical changes   10/03/2022 Imaging   CT chest abdomen pelvis w contrast showed 1. Slight interval enlargement and cavitation of a nodule in the right upper lobe, measuring 0.6 cm, previously 0.5 cm. Slight interval enlargement of a left upper lobe nodule measuring 0.3 cm,previously 0.2 cm. Findings are concerning for slowly enlarging small pulmonary metastases. 2. No significant change in a lobulated soft tissue mass within the central small bowel mesentery abutting the  inferior aspect of the pancreas, consistent with a stable, treated metastasis. 3. No other evidence of lymphadenopathy or metastatic disease in the chest, abdomen, or pelvis. 4. Prostatomegaly with diffuse wall thickening of the relatively decompressed urinary bladder, likely secondary to chronic outlet obstruction. 5. Unchanged fibrotic scarring and bronchiectasis of the bilateral lung bases. This could be further assessed by pulmonary referral and dedicated interstitial lung disease protocol CT examination of the chest if and when clinically appropriate in the setting of known metastatic malignancy. Aortic Atherosclerosis    12/27/2022 Imaging   CT chest abdomen pelvis w contrast showed 1. Continued slight interval enlargement of a cavitary nodule in the right upper lobe measuring 0.7 x 0.6 cm, as well as a nodule in the peripheral left upper lobe measuring 0.5 cm. These are highly suspicious for slowly enlarging pulmonary metastases but remain below size threshold for reliable PET-CT characterization. 2. Unchanged, lobulated hypodense soft tissue mass in the central small bowel mesentery, consistent with treated metastatic disease. 3. Status post transverse colon resection and reanastomosis. 4. Thickening  of the distal sigmoid colon and rectum, consistent with nonspecific infectious or inflammatory colitis. 5. Prostatomegaly with diffuse thickening of the urinary bladder wall, likely secondary to chronic outlet obstruction.   Aortic Atherosclerosis (ICD10-I70.0).   01/09/2023 Imaging   PET scan showed Status post transverse colectomy. 4.2 cm soft tissue mass in the central abdominal mesentery, suspicious for residual/recurrent nodal metastasis. Additional 8 mm short axis left para-aortic nodal metastasis. 7 mm cavitary right upper lobe nodule and 5 mm left upper lobe nodule, both beneath the size threshold for PET sensitivity, but suspicious for small pulmonary metastases.   01/30/2023 -   Radiation Therapy   Palliative radiation to mesenteric soft tissue mass.    05/07/2023 Imaging   CT chest abdomen pelvis w contrast showed  Focal nodular area along the inferior margin of the midbody of the pancreas similar to previous. There is also some abnormal soft tissue extending into the porta hepatis encasing the hepatic artery which compared to previous is also slightly increased.   New small soft tissue nodule adjacent to the SMA proximally.   Previous hypermetabolic node left para-aortic is similar in size to previous.   Stable bilateral small lung nodules. Right lesions again cavitary. No new lung nodules.   Stable surgical changes.  Chest port.     Imaging     08/06/2023 Imaging   CT chest abdomen pelvis w contrast showed Small nodules are again seen. These appears slightly smaller overall from previous. No new lung nodule identified at this time.   Lesion along the inferior margin of the midbody of the pancreas similar to the prior examination. Persistent ill-defined soft tissue extending along the porta hepatis encasing the course of the [facility] artery. Small lymph nodes elsewhere in the upper retroperitoneum are stable.     10/30/2023 Imaging   CT chest abdomen pelvis w contrast showed  1. Relatively similar appearance of mass centered in the pancreatic neck and adjacent infiltrative soft tissue about the common hepatic artery. Given clinical history, this all presumably represents metastatic disease. Pancreatic adenocarcinoma could look similar. 2. No new sites of disease identified. 3. 3-4 mm inferior right upper lobe pulmonary nodule is unchanged. 4. Prostatomegaly with mild bladder wall thickening and chronic pericystic edema, suggesting a component of outlet obstruction. 5. Incidental findings, including: Coronary artery atherosclerosis. Aortic Atherosclerosis (ICD10-I70.0). Similar right common iliac artery ectasia.   12/31/2023 Procedure   EGD  impression No gross lesions in the entire esophagus.  Z-line irregular, 39 cm from incisors. Angulation deformity in the entire stomach.  Erythematous mucosa in the stomach biopsy. Gastric antral vascular ectasia without bleeding found in the atrium.  Treated with argon plasma coagulation.  Duodenal angulation deformity at the duodenal sweep. No close mucosal lesions in the duodenal bulb, in the first portion of the duodenum at in the second portion of the duodenum.  EUS impression A mass was identified in the pancreatic body.  Cytology results are pending.  However the endosonographic appearance is suspicious for adenocarcinoma.  This was staged T2 N0 MX by endosonographic criteria.  The staging applies if malignancy is confirmed.  Fine-needle biopsy performed.  Nonmalignant appearing lymph node were visualized in the gastrohepatic ligament, splenic region, and celiac region.   GASTRIC BIOPSY: - Gastric antral and oxyntic mucosa with chronic gastritis, see comment Immunohistochemical stain for Helicobacter pylori is negative.  PANCREAS, BODY, FINE NEEDLE ASPIRATION: - Malignant cells present - Adenocarcinoma - See comment  Immunohistochemical stains show that the tumor cells are positive for  CDX2 and CK20 while they are negative for CK7, consistent with metastasis from patient's known primary colorectal adenocarcinoma.     Other medical problems Chronic lower extremity edema. 12/24/2018, right lower extremity duplex negative for DVT.  Small right Baker's cyst. 08/16/2019, bilateral lower extremity duplex showed no DVT. 08/16/2019 - 08/17/2019 with right lower extremity cellulitis.  He was unable to bear weight.  He was treated with IV fluids, NSAIDs, colchicine , and broad antibiotics (vancomycin  and Cefepime ).  He was discharged on indomethacin  x 5 days and Keflex  500 mg TID x 5 days.  10/05/2020, colonoscopy showed internal hemorrhoids.  Otherwise normal examination. 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  Status post barium enema no obstructions noted.  Patient was discharged home.  INTERVAL HISTORY Jared Gutierrez is a 83 y.o. male who has above history reviewed by me today presents for follow up visit for management of recurrent metastatic colon cancer Patient was accompanied by wife. Patient complaints about diarrhea since last visit.  Denies abdominal pain, blood in stool.  He denies any recent antibiotic's use.  Denies any laxative use. Appetite is fair.  He has lost 8 pounds since last visit.  He wears denture, however per wife, he does not chew with his denture.  Review of Systems  Constitutional:  Positive for fatigue and unexpected weight change. Negative for appetite change, chills, diaphoresis and fever.  HENT:   Negative for hearing loss, nosebleeds, sore throat and tinnitus.   Respiratory:  Negative for cough, hemoptysis and shortness of breath.   Cardiovascular:  Negative for chest pain and palpitations.  Gastrointestinal:  Positive for diarrhea. Negative for abdominal pain, blood in stool, constipation, nausea and vomiting.  Genitourinary:  Negative for dysuria, frequency and hematuria.   Musculoskeletal:  Positive for arthralgias. Negative for back pain, myalgias and neck pain.  Skin:  Negative for itching and rash.  Neurological:  Positive for numbness. Negative for dizziness and headaches.  Hematological:  Does not bruise/bleed easily.  Psychiatric/Behavioral:  Negative for depression. The patient is not nervous/anxious.       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   Chicken pox    Colon cancer (HCC)    History of kidney stones    Hypertension    Lipoma of colon    Nephrolithiasis    Nephrolithiasis    Obesity    Shingles    Tubular adenoma of colon    multiple fragments     Past Surgical History:  Procedure Laterality Date   COLON SURGERY     COLONOSCOPY N/A 10/02/2014   Procedure: COLONOSCOPY;  Surgeon: Donnice Vaughn Manes, MD;  Location[facility] ENDOSCOPY;  Service: Endoscopy;  Laterality: N/A;   COLONOSCOPY WITH PROPOFOL  N/A 02/16/2017   Procedure: COLONOSCOPY WITH PROPOFOL ;  Surgeon: Viktoria Lamar DASEN, MD;  Location: [facility] ENDOSCOPY;  Service: Endoscopy;  Laterality: N/A;   COLONOSCOPY WITH PROPOFOL  N/A 10/05/2020   Procedure: COLONOSCOPY WITH PROPOFOL ;  Surgeon: Maryruth Ole DASEN, MD;  Location: [facility] ENDOSCOPY;  Service: Endoscopy;  Laterality: N/A;   ESOPHAGOGASTRODUODENOSCOPY N/A 12/31/2023   Procedure: EGD (ESOPHAGOGASTRODUODENOSCOPY);  Surgeon: Wilhelmenia Aloha Raddle., MD;  Location: [facility] ENDOSCOPY;  Service: Gastroenterology;  Laterality: N/A;   EUS N/A 04/17/2019   Procedure: FULL UPPER ENDOSCOPIC ULTRASOUND (EUS) RADIAL;  Surgeon: Queenie Asberry LABOR, MD;  Location: [facility] ENDOSCOPY;  Service: Gastroenterology;  Laterality: N/A;   EUS N/A 12/31/2023   Procedure: ULTRASOUND, UPPER GI TRACT, ENDOSCOPIC;  Surgeon: Wilhelmenia Aloha Raddle., MD;  Location: [facility] ENDOSCOPY;  Service: Gastroenterology;  Laterality: N/A;   FINE NEEDLE ASPIRATION  12/31/2023   Procedure: FINE NEEDLE ASPIRATION;  Surgeon: Wilhelmenia Aloha Raddle., MD;  Location: [facility] ENDOSCOPY;  Service: Gastroenterology;;   KIDNEY STONE SURGERY     PARTIAL COLECTOMY  10/17/2013   PORTACATH PLACEMENT Right 06/13/2019   Procedure: INSERTION PORT-A-CATH;  Surgeon: Tye Millet, DO;  Location: [facility] ORS;  Service: General;  Laterality: Right;    Family History  Problem Relation Age of Onset   Cancer Mother    Breast cancer Mother    COPD Father     Social History:  reports that he has never smoked. He has never used smokeless tobacco. He reports current alcohol use. He reports that he does not use drugs.    Allergies: No Known Allergies  Current Medications: Current Outpatient Medications   Medication Sig Dispense Refill   acetaminophen  (TYLENOL ) 500 MG tablet Take 500 mg by mouth every 6 (six) hours as needed.     amLODipine  (NORVASC ) 2.5 MG tablet Take 2.5 mg by mouth daily.     aspirin  EC 81 MG tablet Take 81 mg by mouth daily.     Cholecalciferol (VITAMIN D) 125 MCG (5000 UT) CAPS Take 5,000 mg by mouth.     cyanocobalamin (VITAMIN B12) 500 MCG tablet Take by mouth.     lidocaine -prilocaine  (EMLA ) cream Apply small amount to port and cover with saran wrap 1-2 hours prior to port access 30 g 3   loratadine (CLARITIN) 10 MG tablet Take 10 mg by mouth daily.     olmesartan (BENICAR) 20 MG tablet Take 1 tablet by mouth daily.     omeprazole  (PRILOSEC) 20 MG capsule Take 20 mg by mouth daily.     ondansetron  (ZOFRAN ) 8 MG tablet Take 1 tablet (8 mg total) by mouth 2 (two) times daily as needed for refractory nausea / vomiting. Start on day 3 after chemotherapy. 60 tablet 1   pantoprazole  (PROTONIX ) 40 MG tablet Take 1 tablet (40 mg total) by mouth every morning. Take in the morning on an empty stomach 60 tablet 0   Probiotic Product (PROBIOTIC DAILY PO) Take 1 tablet by mouth daily.     prochlorperazine  (COMPAZINE ) 10 MG tablet Take 1 tablet (10 mg total) by mouth every 6 (six) hours as needed (NAUSEA). 30 tablet 1   sucralfate  (CARAFATE ) 1 g tablet Take 1 tablet (1 g total) by mouth 2 (two) times daily. 60 tablet 0   No current facility-administered medications for this visit.   Facility-Administered Medications Ordered in Other Visits  Medication Dose Route Frequency Provider Last Rate Last Admin   0.9 %  sodium chloride  infusion   Intravenous Once Corcoran, Melissa C, MD       0.9 %  sodium chloride  infusion   Intravenous Continuous Corcoran, Melissa C, MD 10 mL/hr at 12/31/23 1008 Continued from Pre-op at 12/31/23 1008   heparin  lock flush 100 unit/mL  500 Units Intravenous Once Corcoran, Melissa C, MD       sodium chloride  flush (NS) 0.9 % injection 10 mL  10 mL  Intracatheter PRN Babara Call, MD   10 mL at 02/01/23 1341    Performance status (ECOG): 1  Vitals Blood pressure (!) 89/64, pulse 69, temperature 97.6 F (36.4 C), temperature source Tympanic, resp. rate 18, weight 180 lb 4.8 oz (81.8  kg), SpO2 99%.   Physical Exam Constitutional:      General: He is not in acute distress.    Appearance: He is obese. He is not diaphoretic.  HENT:     Head: Normocephalic and atraumatic.  Eyes:     General: No scleral icterus. Cardiovascular:     Rate and Rhythm: Normal rate and regular rhythm.     Heart sounds: No murmur heard. Pulmonary:     Effort: Pulmonary effort is normal. No respiratory distress.     Breath sounds: No wheezing.     Comments: Decreased breath sound bilaterally.  Abdominal:     General: Bowel sounds are normal. There is no distension.     Palpations: Abdomen is soft.  Musculoskeletal:        General: Normal range of motion.     Cervical back: Normal range of motion and neck supple.     Comments: +1 edema bilateral lower extremities.   Skin:    General: Skin is warm and dry.     Findings: No erythema.  Neurological:     Mental Status: He is alert and oriented to person, place, and time. Mental status is at baseline.     Motor: No abnormal muscle tone.  Psychiatric:        Mood and Affect: Mood and affect normal.     Labs were reviewed by me.     Latest Ref Rng & Units 01/17/2024   12:09 PM 12/18/2023   10:02 AM 12/06/2023    1:00 AM  CBC  WBC 4.0 - 10.5 K/uL 5.9  5.5  4.5   Hemoglobin 13.0 - 17.0 g/dL 88.6  89.9  9.4   Hematocrit 39.0 - 52.0 % 34.0  29.4  26.9   Platelets 150 - 400 K/uL 181  185  142       Latest Ref Rng & Units 01/17/2024   12:09 PM 12/18/2023   10:02 AM 12/06/2023    1:00 AM  CMP  Glucose 70 - 99 mg/dL 893  848  879   BUN 8 - 23 mg/dL 11  18  10    Creatinine 0.61 - 1.24 mg/dL 9.19  9.36  9.17   Sodium 135 - 145 mmol/L 135  135  133   Potassium 3.5 - 5.1 mmol/L 3.3  3.5  3.4   Chloride 98 - 111  mmol/L 107  111  106   CO2 22 - 32 mmol/L 19  18  17    Calcium  8.9 - 10.3 mg/dL 9.4  8.9  8.4   Total Protein 6.5 - 8.1 g/dL 6.6  5.9    Total Bilirubin 0.0 - 1.2 mg/dL 1.0  1.2    Alkaline Phos 38 - 126 U/L 58  47    AST 15 - 41 U/L 32  27    ALT 0 - 44 U/L 16  15

## 2024-01-18 ENCOUNTER — Other Ambulatory Visit: Payer: Self-pay

## 2024-01-18 LAB — CEA: CEA: 38.1 ng/mL — ABNORMAL HIGH (ref 0.0–4.7)

## 2024-01-21 ENCOUNTER — Telehealth: Payer: Self-pay

## 2024-01-21 NOTE — Telephone Encounter (Signed)
 Cdiff was not tested on stool sample provided on 9/4. Called pt, no answer. Detailed VM left on ph letting him know that if he is still having loose stools, it is advised for him to come and pick up a specimen cup to provide another stool sample for testing. Also informed him that he is scheduled to see nutritionist on 9/11 and he can provide a sample then.

## 2024-01-24 ENCOUNTER — Inpatient Hospital Stay

## 2024-01-29 ENCOUNTER — Other Ambulatory Visit: Payer: Self-pay

## 2024-01-29 ENCOUNTER — Inpatient Hospital Stay

## 2024-01-29 DIAGNOSIS — C184 Malignant neoplasm of transverse colon: Secondary | ICD-10-CM | POA: Diagnosis not present

## 2024-01-29 DIAGNOSIS — R197 Diarrhea, unspecified: Secondary | ICD-10-CM

## 2024-01-29 LAB — C DIFFICILE QUICK SCREEN W PCR REFLEX
C Diff antigen: NEGATIVE
C Diff interpretation: NOT DETECTED
C Diff toxin: NEGATIVE

## 2024-01-29 NOTE — Progress Notes (Signed)
 Nutrition Follow-up:   Patient with metastatic colon cancer.  S/p transverse colectomy (2015).  Treatment on hold.  Noted hospital admission in July for abdominal pain.  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    Met with patient and wife in clinic.  Reports that appetite is ok.  Eats oatmeal in the morning with peaches, coffee and pudding.  Yesterday drank a boost shake mid day.  Around 5pm ate vegetable soup and applesauce.  Says that he has a little bit of abdominal pain but that is does not keep him from eating.  Reports loose stool about 1 hour after he eats. He has not been taking megace  because pharmacy can't get it.   Denies reflux, nausea.     Medications: carafate , protonix , compazine , zofran , probiotic, vit D, Vit B 12  Labs: K 3.3, glucose 106  Anthropometrics:   Weight 183 lb   200 lb 11.2 oz on 5/6 205 lb 4.8 oz on 4/1 213 lb 8 oz on 05/22/23 206 lb 9.6 oz on 11/12 209 lb 14.4 oz on 02/27/23  8% weight loss in the last  4 months, concerning  Estimated Energy Needs  Kcals: 2075-2400 Protein: 103-120 g Fluid: > 2 L  NUTRITION DIAGNOSIS: Unintentional weight loss continues    INTERVENTION:  Discussed ways to add calories and protein with patient and wife.  Handout on High Calorie, High Protein diet given and snack ideas provided Recommend eating q 2 hours Sample of boost [facility] given to patient.  Can order via [employer].  Encouraged at least BID Spoke with nursing about stool cup for continued loose stool.      MONITORING, EVALUATION, GOAL: weight trends, intake   NEXT VISIT: Wed, Oct 29 after MD appointment  Angeleah Labrake B. Dasie SOLON, CSO, LDN Registered Dietitian [phone]

## 2024-02-15 ENCOUNTER — Other Ambulatory Visit: Payer: Self-pay

## 2024-02-27 ENCOUNTER — Ambulatory Visit
Admission: RE | Admit: 2024-02-27 | Discharge: 2024-02-27 | Disposition: A | Discharge status: Home / Self Care | Source: Ambulatory Visit | Attending: Oncology | Admitting: Oncology

## 2024-02-27 ENCOUNTER — Other Ambulatory Visit: Payer: Self-pay | Admitting: Oncology

## 2024-02-27 DIAGNOSIS — C184 Malignant neoplasm of transverse colon: Secondary | ICD-10-CM | POA: Insufficient documentation

## 2024-02-27 MED ORDER — BARIUM SULFATE 2 % PO SUSP
450.0000 mL | ORAL | Status: AC
  Administered 2024-02-27 (×2): 450 mL via ORAL

## 2024-02-27 MED ORDER — IOHEXOL 300 MG/ML  SOLN
100.0000 mL | Freq: Once | INTRAMUSCULAR | Status: AC | PRN
  Administered 2024-02-27: 100 mL via INTRAVENOUS

## 2024-02-29 ENCOUNTER — Encounter: Payer: Self-pay | Admitting: Oncology

## 2024-03-03 ENCOUNTER — Telehealth: Payer: Self-pay | Admitting: *Deleted

## 2024-03-03 ENCOUNTER — Encounter: Payer: Self-pay | Admitting: *Deleted

## 2024-03-03 ENCOUNTER — Inpatient Hospital Stay
Admission: EM | Admit: 2024-03-03 | Discharge: 2024-03-06 | DRG: 444 | Disposition: A | Discharge status: Home Health | Attending: Internal Medicine | Admitting: Internal Medicine

## 2024-03-03 ENCOUNTER — Other Ambulatory Visit: Payer: Self-pay

## 2024-03-03 ENCOUNTER — Emergency Department

## 2024-03-03 DIAGNOSIS — D689 Coagulation defect, unspecified: Secondary | ICD-10-CM | POA: Diagnosis present

## 2024-03-03 DIAGNOSIS — R109 Unspecified abdominal pain: Secondary | ICD-10-CM | POA: Diagnosis present

## 2024-03-03 DIAGNOSIS — C7889 Secondary malignant neoplasm of other digestive organs: Secondary | ICD-10-CM | POA: Diagnosis present

## 2024-03-03 DIAGNOSIS — Z9049 Acquired absence of other specified parts of digestive tract: Secondary | ICD-10-CM

## 2024-03-03 DIAGNOSIS — E8809 Other disorders of plasma-protein metabolism, not elsewhere classified: Secondary | ICD-10-CM | POA: Diagnosis present

## 2024-03-03 DIAGNOSIS — Z860101 Personal history of adenomatous and serrated colon polyps: Secondary | ICD-10-CM

## 2024-03-03 DIAGNOSIS — R001 Bradycardia, unspecified: Secondary | ICD-10-CM | POA: Diagnosis present

## 2024-03-03 DIAGNOSIS — Z66 Do not resuscitate: Secondary | ICD-10-CM | POA: Diagnosis present

## 2024-03-03 DIAGNOSIS — C184 Malignant neoplasm of transverse colon: Secondary | ICD-10-CM | POA: Diagnosis present

## 2024-03-03 DIAGNOSIS — Z23 Encounter for immunization: Secondary | ICD-10-CM

## 2024-03-03 DIAGNOSIS — K219 Gastro-esophageal reflux disease without esophagitis: Secondary | ICD-10-CM | POA: Diagnosis present

## 2024-03-03 DIAGNOSIS — K8689 Other specified diseases of pancreas: Principal | ICD-10-CM

## 2024-03-03 DIAGNOSIS — Z9221 Personal history of antineoplastic chemotherapy: Secondary | ICD-10-CM

## 2024-03-03 DIAGNOSIS — R7989 Other specified abnormal findings of blood chemistry: Secondary | ICD-10-CM | POA: Diagnosis present

## 2024-03-03 DIAGNOSIS — I10 Essential (primary) hypertension: Secondary | ICD-10-CM | POA: Diagnosis present

## 2024-03-03 DIAGNOSIS — C787 Secondary malignant neoplasm of liver and intrahepatic bile duct: Secondary | ICD-10-CM | POA: Diagnosis present

## 2024-03-03 DIAGNOSIS — Z8619 Personal history of other infectious and parasitic diseases: Secondary | ICD-10-CM

## 2024-03-03 DIAGNOSIS — N400 Enlarged prostate without lower urinary tract symptoms: Secondary | ICD-10-CM | POA: Diagnosis present

## 2024-03-03 DIAGNOSIS — K7200 Acute and subacute hepatic failure without coma: Secondary | ICD-10-CM | POA: Diagnosis present

## 2024-03-03 DIAGNOSIS — Z87442 Personal history of urinary calculi: Secondary | ICD-10-CM

## 2024-03-03 DIAGNOSIS — Z7982 Long term (current) use of aspirin: Secondary | ICD-10-CM

## 2024-03-03 DIAGNOSIS — R17 Unspecified jaundice: Secondary | ICD-10-CM

## 2024-03-03 DIAGNOSIS — M1990 Unspecified osteoarthritis, unspecified site: Secondary | ICD-10-CM | POA: Diagnosis present

## 2024-03-03 DIAGNOSIS — Z79899 Other long term (current) drug therapy: Secondary | ICD-10-CM

## 2024-03-03 DIAGNOSIS — K831 Obstruction of bile duct: Secondary | ICD-10-CM | POA: Diagnosis not present

## 2024-03-03 DIAGNOSIS — D638 Anemia in other chronic diseases classified elsewhere: Secondary | ICD-10-CM | POA: Diagnosis present

## 2024-03-03 LAB — HEPATIC FUNCTION PANEL
ALT: 102 U/L — ABNORMAL HIGH (ref 0–44)
AST: 188 U/L — ABNORMAL HIGH (ref 15–41)
Albumin: 2.4 g/dL — ABNORMAL LOW (ref 3.5–5.0)
Alkaline Phosphatase: 342 U/L — ABNORMAL HIGH (ref 38–126)
Bilirubin, Direct: 17.4 mg/dL — ABNORMAL HIGH (ref 0.0–0.2)
Total Bilirubin: 28.8 mg/dL (ref 0.0–1.2)
Total Protein: 6 g/dL — ABNORMAL LOW (ref 6.5–8.1)

## 2024-03-03 LAB — BASIC METABOLIC PANEL WITH GFR
Anion gap: 9 (ref 5–15)
BUN: 7 mg/dL — ABNORMAL LOW (ref 8–23)
CO2: 23 mmol/L (ref 22–32)
Calcium: 9.4 mg/dL (ref 8.9–10.3)
Chloride: 106 mmol/L (ref 98–111)
Creatinine, Ser: <0.3 mg/dL — ABNORMAL LOW (ref 0.61–1.24)
Glucose, Bld: 109 mg/dL — ABNORMAL HIGH (ref 70–99)
Potassium: 3.5 mmol/L (ref 3.5–5.1)
Sodium: 138 mmol/L (ref 135–145)

## 2024-03-03 LAB — CBC
HCT: 28.8 % — ABNORMAL LOW (ref 39.0–52.0)
Hemoglobin: 10.2 g/dL — ABNORMAL LOW (ref 13.0–17.0)
MCH: 32.2 pg (ref 26.0–34.0)
MCHC: 35.4 g/dL (ref 30.0–36.0)
MCV: 90.9 fL (ref 80.0–100.0)
Platelets: 189 K/uL (ref 150–400)
RBC: 3.17 MIL/uL — ABNORMAL LOW (ref 4.22–5.81)
RDW: 19.2 % — ABNORMAL HIGH (ref 11.5–15.5)
WBC: 4.9 K/uL (ref 4.0–10.5)
nRBC: 0 % (ref 0.0–0.2)

## 2024-03-03 LAB — PROTIME-INR
INR: 1.6 — ABNORMAL HIGH (ref 0.8–1.2)
Prothrombin Time: 20 s — ABNORMAL HIGH (ref 11.4–15.2)

## 2024-03-03 LAB — LIPASE, BLOOD: Lipase: 30 U/L (ref 11–51)

## 2024-03-03 MED ORDER — GADOBUTROL 1 MMOL/ML IV SOLN
8.0000 mL | Freq: Once | INTRAVENOUS | Status: AC | PRN
  Administered 2024-03-04: 8 mL via INTRAVENOUS

## 2024-03-03 NOTE — Telephone Encounter (Signed)
 Dr. Babara recommends ER. Spoke to pt's wife and informed her of this. She verbalized understanding.   Called ER and spoke to Mize and informed her of situation.

## 2024-03-03 NOTE — ED Provider Notes (Signed)
 [facility] Provider Note    Event Date/Time   First MD Initiated Contact with Patient 03/03/24 2042     (approximate)   History   Jaundice   HPI  STEVENSON WINDMILLER is a 83 y.o. male with history of recurrent metastatic colon cancer who presents to the emergency department for concerns for abnormal outpatient imaging and jaundice for the past few days.  Has had abdominal pain but none currently.  No vomiting.  Chronically has loose stools.  No blood or melena.  Dr. Babara is his oncologist and sent him here to the ED for MRCP.  Patient not currently receiving chemotherapy.   History provided by patient, family.    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   Chicken pox    Colon cancer (HCC)    History of kidney stones    Hypertension    Lipoma of colon    Nephrolithiasis    Nephrolithiasis    Obesity    Shingles    Tubular adenoma of colon    multiple fragments    Past Surgical History:  Procedure Laterality Date   COLON SURGERY     COLONOSCOPY N/A 10/02/2014   Procedure: COLONOSCOPY;  Surgeon: Donnice Vaughn Manes, MD;  Location: [facility] ENDOSCOPY;  Service: Endoscopy;  Laterality: N/A;   COLONOSCOPY WITH PROPOFOL  N/A 02/16/2017   Procedure: COLONOSCOPY WITH PROPOFOL ;  Surgeon: Viktoria Lamar DASEN, MD;  Location: [facility] ENDOSCOPY;  Service: Endoscopy;  Laterality: N/A;   COLONOSCOPY WITH PROPOFOL  N/A 10/05/2020   Procedure: COLONOSCOPY WITH PROPOFOL ;  Surgeon: Maryruth Ole DASEN, MD;  Location: [facility] ENDOSCOPY;  Service: Endoscopy;  Laterality: N/A;   ESOPHAGOGASTRODUODENOSCOPY N/A 12/31/2023   Procedure: EGD (ESOPHAGOGASTRODUODENOSCOPY);  Surgeon: Wilhelmenia Aloha Raddle., MD;  Location: [facility] ENDOSCOPY;  Service: Gastroenterology;  Laterality: N/A;   EUS N/A 04/17/2019   Procedure: FULL UPPER ENDOSCOPIC ULTRASOUND (EUS) RADIAL;  Surgeon: Queenie Asberry LABOR, MD;   Location: [facility] ENDOSCOPY;  Service: Gastroenterology;  Laterality: N/A;   EUS N/A 12/31/2023   Procedure: ULTRASOUND, UPPER GI TRACT, ENDOSCOPIC;  Surgeon: Wilhelmenia Aloha Raddle., MD;  Location: [facility] ENDOSCOPY;  Service: Gastroenterology;  Laterality: N/A;   FINE NEEDLE ASPIRATION  12/31/2023   Procedure: FINE NEEDLE ASPIRATION;  Surgeon: Wilhelmenia Aloha Raddle., MD;  Location: [facility] ENDOSCOPY;  Service: Gastroenterology;;   KIDNEY STONE SURGERY     PARTIAL COLECTOMY  10/17/2013   PORTACATH PLACEMENT Right 06/13/2019   Procedure: INSERTION PORT-A-CATH;  Surgeon: Tye Millet, DO;  Location: [facility] ORS;  Service: General;  Laterality: Right;    MEDICATIONS:  Prior to Admission medications   Medication Sig Start Date End Date Taking? Authorizing Provider  acetaminophen  (TYLENOL ) 500 MG tablet Take 500 mg by mouth every 6 (six) hours as needed.    [provider]  amLODipine  (NORVASC ) 2.5 MG tablet Take 2.5 mg by mouth daily.    [provider]  aspirin  EC 81 MG tablet Take 81 mg by mouth daily.    [provider]  Cholecalciferol (VITAMIN D) 125 MCG (5000 UT) CAPS Take 5,000 mg by mouth.    [provider]  cyanocobalamin (VITAMIN B12) 500 MCG tablet Take by mouth. 07/26/23 07/25/24  [provider]  lidocaine -prilocaine  (EMLA ) cream Apply small amount to port and cover with saran wrap 1-2 hours prior to port access 08/27/23   Babara Call, MD  loratadine (CLARITIN) 10 MG tablet Take 10 mg by mouth daily.    [provider]  olmesartan (BENICAR) 20 MG tablet Take 1 tablet by mouth daily. 10/14/21   [provider]  omeprazole  (PRILOSEC) 20 MG capsule Take 20 mg by mouth daily. 12/25/23   [provider]  ondansetron  (ZOFRAN ) 8 MG tablet Take 1 tablet (8 mg total) by mouth 2 (two) times daily as needed for refractory nausea / vomiting. Start on day 3 after chemotherapy. 11/16/20   Babara Call, MD  pantoprazole  (PROTONIX ) 40 MG tablet Take 1 tablet  (40 mg total) by mouth every morning. Take in the morning on an empty stomach 12/07/23 02/05/24  Jhonny Calvin NOVAK, MD  Probiotic Product (PROBIOTIC DAILY PO) Take 1 tablet by mouth daily.    [provider]  prochlorperazine  (COMPAZINE ) 10 MG tablet Take 1 tablet (10 mg total) by mouth every 6 (six) hours as needed (NAUSEA). 11/16/20   Babara Call, MD  sucralfate  (CARAFATE ) 1 g tablet Take 1 tablet (1 g total) by mouth 2 (two) times daily. 12/31/23 12/30/24  Mansouraty, Aloha Raddle., MD    Physical Exam   Triage Vital Signs: ED Triage Vitals  Encounter Vitals Group     BP 03/03/24 1747 122/70     Girls Systolic BP Percentile --      Girls Diastolic BP Percentile --      Boys Systolic BP Percentile --      Boys Diastolic BP Percentile --      Pulse Rate 03/03/24 1747 73     Resp 03/03/24 1747 18     Temp 03/03/24 1747 98.2 F (36.8 C)     Temp Source 03/03/24 1747 Oral     SpO2 03/03/24 1747 100 %     Weight 03/03/24 1800 187 lb (84.8 kg)     Height 03/03/24 1800 5' 8 (1.727 m)     Head Circumference --      Peak Flow --      Pain Score 03/03/24 1800 0     Pain Loc --      Pain Education --      Exclude from Growth Chart --     Most recent vital signs: Vitals:   03/03/24 1747 03/03/24 2200  BP: 122/70 120/60  Pulse: 73 (!) 59  Resp: 18 10  Temp: 98.2 F (36.8 C) 98.3 F (36.8 C)  SpO2: 100% 100%    CONSTITUTIONAL: Alert, responds appropriately to questions.  Elderly, in no distress HEAD: Normocephalic, atraumatic EYES: Conjunctivae clear, pupils appear equal, scleral icterus noted ENT: normal nose; moist mucous membranes NECK: Supple, normal ROM CARD: RRR; S1 and S2 appreciated RESP: Normal chest excursion without splinting or tachypnea; breath sounds clear and equal bilaterally; no wheezes, no rhonchi, no rales, no hypoxia or respiratory distress, speaking full sentences ABD/GI: Non-distended; soft, non-tender, no rebound, no guarding, no peritoneal signs BACK:  The back appears normal EXT: Normal ROM in all joints; no deformity noted, no edema SKIN: Normal color for age and race; warm; jaundiced appearing skin NEURO: Moves all extremities equally, normal speech PSYCH: The patient's mood and manner are appropriate.   ED Results / Procedures / Treatments   LABS: (all labs ordered are listed, but only abnormal results are displayed) Labs Reviewed  BASIC METABOLIC PANEL WITH GFR - Abnormal; Notable for the following components:      Result Value   Glucose, Bld 109 (*)    BUN 7 (*)  Creatinine, Ser <0.30 (*)    All other components within normal limits  CBC - Abnormal; Notable for the following components:   RBC 3.17 (*)    Hemoglobin 10.2 (*)    HCT 28.8 (*)    RDW 19.2 (*)    All other components within normal limits  PROTIME-INR - Abnormal; Notable for the following components:   Prothrombin Time 20.0 (*)    INR 1.6 (*)    All other components within normal limits  HEPATIC FUNCTION PANEL - Abnormal; Notable for the following components:   Total Protein 6.0 (*)    Albumin 2.4 (*)    AST 188 (*)    ALT 102 (*)    Alkaline Phosphatase 342 (*)    Total Bilirubin 28.8 (*)    Bilirubin, Direct 17.4 (*)    All other components within normal limits  URINALYSIS, W/ REFLEX TO CULTURE (INFECTION SUSPECTED) - Abnormal; Notable for the following components:   Color, Urine AMBER (*)    APPearance HAZY (*)    Bilirubin Urine MODERATE (*)    Bacteria, UA RARE (*)    All other components within normal limits  LIPASE, BLOOD     EKG:  EKG Interpretation Date/Time:    Ventricular Rate:    PR Interval:    QRS Duration:    QT Interval:    QTC Calculation:   R Axis:      Text Interpretation:           RADIOLOGY: My personal review and interpretation of imaging: MRCP shows pancreatic mass causing intra and extrahepatic ductal dilatation, common bile duct dilatation.  I have personally reviewed all radiology reports.   No results  found.   PROCEDURES:  Critical Care performed: Yes, see critical care procedure note(s)   CRITICAL CARE Performed by: Josette Sink   Total critical care time: 30 minutes  Critical care time was exclusive of separately billable procedures and treating other patients.  Critical care was necessary to treat or prevent imminent or life-threatening deterioration.  Critical care was time spent personally by me on the following activities: development of treatment plan with patient and/or surrogate as well as nursing, discussions with consultants, evaluation of patient's response to treatment, examination of patient, obtaining history from patient or surrogate, ordering and performing treatments and interventions, ordering and review of laboratory studies, ordering and review of radiographic studies, pulse oximetry and re-evaluation of patient's condition.   Procedures    IMPRESSION / MDM / ASSESSMENT AND PLAN / ED COURSE  I reviewed the triage vital signs and the nursing notes.    Patient here for jaundice, scleral icterus, abdominal pain that has resolved and abnormal outpatient imaging.  The patient is on the cardiac monitor to evaluate for evidence of arrhythmia and/or significant heart rate changes.   DIFFERENTIAL DIAGNOSIS (includes but not limited to):   Cholecystitis, cholelithiasis, choledocholithiasis, pancreatitis, pancreatic mass, cholangitis, worsening malignancy   Patient's presentation is most consistent with acute presentation with potential threat to life or bodily function.   PLAN: Repeat labs pending.  Will obtain MRCP with and without contrast.  He denies any pain currently.  Will keep him NPO.   MEDICATIONS GIVEN IN ED: Medications  piperacillin-tazobactam (ZOSYN) IVPB 3.375 g (has no administration in time range)  0.9 %  sodium chloride  infusion ( Intravenous New Bag/Given 03/04/24 0151)  gadobutrol (GADAVIST) 1 MMOL/ML injection 8 mL (8 mLs Intravenous  Contrast Given 03/04/24 0104)  fentaNYL  (SUBLIMAZE ) injection 50 mcg (50 mcg  Intravenous Given 03/04/24 0152)  ondansetron  (ZOFRAN ) injection 4 mg (4 mg Intravenous Given 03/04/24 0151)     ED COURSE: Labs show stable chronic anemia.  Normal creatinine, electrolytes.  Significantly elevated liver function test new compared to January 17, 2024.  Lipase normal.   12:57 AM  Spoke with Dr. Evelyne with radiology.  Patient has significant common bile duct and intra and extra hepatic ductal dilatation from a growing pancreatic mass.  Will discuss with GI on-call for further recommendations.  CONSULTS: Discussed with Dr. Onita on-call for GI.  He recommends giving patient Zosyn in case he develops symptoms of cholangitis, keeping him NPO.  He will contact Dr. Jinny for possible ERCP and stent placement versus evaluation by interventional radiology for a drain.  Patient can be admitted to the hospital here.  Will discuss with our hospitalist.  Patient and family comfortable with this plan.  1:40 AM  Consulted and discussed patient's case with hospitalist, Dr. Lawence.  I have recommended admission and consulting physician agrees and will place admission orders.  Patient (and family if present) agree with this plan.   I reviewed all nursing notes, vitals, pertinent previous records.  All labs, EKGs, imaging ordered have been independently reviewed and interpreted by myself.    OUTSIDE RECORDS REVIEWED: Reviewed recent oncology notes.   CT chest, abdomen pelvis on 02/27/2024:   IMPRESSION: 1. There is a new 3 x 5 mm solid noncalcified nodule in the left upper lobe. Otherwise, no metastatic disease identified within the chest. 2. There is apparent mild irregular wall thickening of the left paramedian transverse colon, which is likely accentuated by underdistention. Findings are nonspecific but can be seen with primary or secondary colitis or other etiologies including neoplastic process. 3. There  is new moderate intrahepatic bile duct dilation as well as moderate-to-severe extrahepatic bile duct dilation. Gallbladder is distended measuring up to 5.7 cm in width. No abnormal wall thickening or pericholecystic fat stranding. No calcified gallstones. Correlate clinically and with liver function tests to determine the need for further evaluation with MRI/MRCP. 4. Redemonstration of heterogeneous hypoattenuating mass centered in the proximal pancreatic body. 5. Multiple other nonacute observations, as described above.    FINAL CLINICAL IMPRESSION(S) / ED DIAGNOSES   Final diagnoses:  Pancreatic mass  Elevated LFTs     Rx / DC Orders   ED Discharge Orders     None        Note:  This document was prepared using Dragon voice recognition software and may include unintentional dictation errors.   Sameena Artus, Josette SAILOR, DO 03/04/24 [phone]

## 2024-03-03 NOTE — ED Triage Notes (Addendum)
 Pt to triage via wheelchair.  Pt had a ct last week of abd.  Now pt has jaundice since last week.   No abd pain.  Pt has colon cancer. Pt alert  pt has a port  pt finished chemo 6/25

## 2024-03-03 NOTE — Telephone Encounter (Signed)
 The wife today called and said that the patient had gotten a scan and the staff there told him that they had a enlarged gallbladder and that his skin was yellow.  Wife wants to know what to do and ask Dr. Babara

## 2024-03-03 NOTE — ED Triage Notes (Signed)
 First Nurse Note: Patient sent to ED by Dr Babara due to pt having a distended gallbladder and is jaundice. Currently has colon cancer.

## 2024-03-04 ENCOUNTER — Inpatient Hospital Stay

## 2024-03-04 ENCOUNTER — Emergency Department

## 2024-03-04 ENCOUNTER — Inpatient Hospital Stay: Admitting: Certified Registered"

## 2024-03-04 ENCOUNTER — Encounter: Payer: Self-pay | Admitting: Gastroenterology

## 2024-03-04 ENCOUNTER — Encounter: Admission: EM | Disposition: A | Payer: Self-pay | Source: Home / Self Care | Attending: Internal Medicine

## 2024-03-04 DIAGNOSIS — K8689 Other specified diseases of pancreas: Secondary | ICD-10-CM | POA: Diagnosis not present

## 2024-03-04 DIAGNOSIS — Z8619 Personal history of other infectious and parasitic diseases: Secondary | ICD-10-CM | POA: Diagnosis not present

## 2024-03-04 DIAGNOSIS — R7989 Other specified abnormal findings of blood chemistry: Secondary | ICD-10-CM | POA: Diagnosis present

## 2024-03-04 DIAGNOSIS — Z79899 Other long term (current) drug therapy: Secondary | ICD-10-CM | POA: Diagnosis not present

## 2024-03-04 DIAGNOSIS — C184 Malignant neoplasm of transverse colon: Secondary | ICD-10-CM | POA: Diagnosis present

## 2024-03-04 DIAGNOSIS — K831 Obstruction of bile duct: Secondary | ICD-10-CM | POA: Diagnosis present

## 2024-03-04 DIAGNOSIS — E8809 Other disorders of plasma-protein metabolism, not elsewhere classified: Secondary | ICD-10-CM | POA: Diagnosis present

## 2024-03-04 DIAGNOSIS — N400 Enlarged prostate without lower urinary tract symptoms: Secondary | ICD-10-CM | POA: Diagnosis present

## 2024-03-04 DIAGNOSIS — R001 Bradycardia, unspecified: Secondary | ICD-10-CM | POA: Diagnosis present

## 2024-03-04 DIAGNOSIS — R109 Unspecified abdominal pain: Secondary | ICD-10-CM

## 2024-03-04 DIAGNOSIS — K7200 Acute and subacute hepatic failure without coma: Secondary | ICD-10-CM | POA: Diagnosis present

## 2024-03-04 DIAGNOSIS — D638 Anemia in other chronic diseases classified elsewhere: Secondary | ICD-10-CM | POA: Diagnosis present

## 2024-03-04 DIAGNOSIS — K838 Other specified diseases of biliary tract: Secondary | ICD-10-CM

## 2024-03-04 DIAGNOSIS — Z87442 Personal history of urinary calculi: Secondary | ICD-10-CM | POA: Diagnosis not present

## 2024-03-04 DIAGNOSIS — C7889 Secondary malignant neoplasm of other digestive organs: Secondary | ICD-10-CM | POA: Diagnosis present

## 2024-03-04 DIAGNOSIS — Z9049 Acquired absence of other specified parts of digestive tract: Secondary | ICD-10-CM | POA: Diagnosis not present

## 2024-03-04 DIAGNOSIS — Z860101 Personal history of adenomatous and serrated colon polyps: Secondary | ICD-10-CM | POA: Diagnosis not present

## 2024-03-04 DIAGNOSIS — K219 Gastro-esophageal reflux disease without esophagitis: Secondary | ICD-10-CM | POA: Diagnosis present

## 2024-03-04 DIAGNOSIS — I10 Essential (primary) hypertension: Secondary | ICD-10-CM | POA: Diagnosis present

## 2024-03-04 DIAGNOSIS — Z7982 Long term (current) use of aspirin: Secondary | ICD-10-CM | POA: Diagnosis not present

## 2024-03-04 DIAGNOSIS — Z23 Encounter for immunization: Secondary | ICD-10-CM | POA: Diagnosis present

## 2024-03-04 DIAGNOSIS — Z9221 Personal history of antineoplastic chemotherapy: Secondary | ICD-10-CM | POA: Diagnosis not present

## 2024-03-04 DIAGNOSIS — D689 Coagulation defect, unspecified: Secondary | ICD-10-CM | POA: Diagnosis present

## 2024-03-04 DIAGNOSIS — M1990 Unspecified osteoarthritis, unspecified site: Secondary | ICD-10-CM | POA: Diagnosis present

## 2024-03-04 DIAGNOSIS — Z66 Do not resuscitate: Secondary | ICD-10-CM | POA: Diagnosis present

## 2024-03-04 DIAGNOSIS — C787 Secondary malignant neoplasm of liver and intrahepatic bile duct: Secondary | ICD-10-CM | POA: Diagnosis present

## 2024-03-04 HISTORY — PX: BILIARY STENT PLACEMENT: SHX5538

## 2024-03-04 HISTORY — PX: ERCP: SHX5425

## 2024-03-04 LAB — URINALYSIS, W/ REFLEX TO CULTURE (INFECTION SUSPECTED)
Glucose, UA: NEGATIVE mg/dL
Hgb urine dipstick: NEGATIVE
Ketones, ur: NEGATIVE mg/dL
Leukocytes,Ua: NEGATIVE
Nitrite: NEGATIVE
Protein, ur: NEGATIVE mg/dL
Specific Gravity, Urine: 1.015 (ref 1.005–1.030)
pH: 5 (ref 5.0–8.0)

## 2024-03-04 LAB — PROTIME-INR
INR: 1.9 — ABNORMAL HIGH (ref 0.8–1.2)
Prothrombin Time: 22.8 s — ABNORMAL HIGH (ref 11.4–15.2)

## 2024-03-04 LAB — AMMONIA: Ammonia: 39 umol/L — ABNORMAL HIGH (ref 9–35)

## 2024-03-04 SURGERY — ERCP, WITH INTERVENTION IF INDICATED
Anesthesia: General

## 2024-03-04 MED ORDER — ONDANSETRON HCL 4 MG PO TABS: 4.0000 mg | ORAL_TABLET | Freq: Four times a day (QID) | ORAL | Status: DC | PRN

## 2024-03-04 MED ORDER — INFLUENZA VAC SPLIT HIGH-DOSE 0.5 ML IM SUSY
0.5000 mL | PREFILLED_SYRINGE | INTRAMUSCULAR | Status: AC
Start: 2024-03-05 — End: 2024-03-06
  Administered 2024-03-06: 0.5 mL via INTRAMUSCULAR
  Filled 2024-03-04 (×2): qty 0.5

## 2024-03-04 MED ORDER — SUCCINYLCHOLINE CHLORIDE 200 MG/10ML IV SOSY
PREFILLED_SYRINGE | INTRAVENOUS | Status: AC
  Filled 2024-03-04: qty 10

## 2024-03-04 MED ORDER — LIDOCAINE HCL (CARDIAC) PF 100 MG/5ML IV SOSY
PREFILLED_SYRINGE | INTRAVENOUS | Status: DC | PRN
  Administered 2024-03-04: 60 mg via INTRAVENOUS

## 2024-03-04 MED ORDER — HYDROMORPHONE HCL 1 MG/ML IJ SOLN: 0.5000 mg | INTRAMUSCULAR | Status: DC | PRN

## 2024-03-04 MED ORDER — PROPOFOL 10 MG/ML IV BOLUS
INTRAVENOUS | Status: DC | PRN
  Administered 2024-03-04: 90 mg via INTRAVENOUS
  Administered 2024-03-04: 30 mg via INTRAVENOUS

## 2024-03-04 MED ORDER — SUCCINYLCHOLINE CHLORIDE 200 MG/10ML IV SOSY
PREFILLED_SYRINGE | INTRAVENOUS | Status: DC | PRN
  Administered 2024-03-04: 100 mg via INTRAVENOUS

## 2024-03-04 MED ORDER — SODIUM CHLORIDE 0.9 % IV BOLUS (SEPSIS)
1000.0000 mL | Freq: Once | INTRAVENOUS | Status: AC
  Administered 2024-03-04: 1000 mL via INTRAVENOUS

## 2024-03-04 MED ORDER — PHENYLEPHRINE 80 MCG/ML (10ML) SYRINGE FOR IV PUSH (FOR BLOOD PRESSURE SUPPORT)
PREFILLED_SYRINGE | INTRAVENOUS | Status: DC | PRN
  Administered 2024-03-04: 80 ug via INTRAVENOUS
  Administered 2024-03-04: 160 ug via INTRAVENOUS
  Administered 2024-03-04: 120 ug via INTRAVENOUS
  Administered 2024-03-04: 80 ug via INTRAVENOUS
  Administered 2024-03-04: 160 ug via INTRAVENOUS
  Administered 2024-03-04 (×2): 120 ug via INTRAVENOUS
  Administered 2024-03-04: 80 ug via INTRAVENOUS

## 2024-03-04 MED ORDER — PIPERACILLIN-TAZOBACTAM 3.375 G IVPB 30 MIN
3.3750 g | Freq: Once | INTRAVENOUS | Status: AC
  Administered 2024-03-04: 3.375 g via INTRAVENOUS
  Filled 2024-03-04: qty 50

## 2024-03-04 MED ORDER — PNEUMOCOCCAL 20-VAL CONJ VACC 0.5 ML IM SUSY
0.5000 mL | PREFILLED_SYRINGE | INTRAMUSCULAR | Status: AC
  Administered 2024-03-06: 0.5 mL via INTRAMUSCULAR
  Filled 2024-03-04: qty 0.5

## 2024-03-04 MED ORDER — EPHEDRINE 5 MG/ML INJ
INTRAVENOUS | Status: AC
  Filled 2024-03-04: qty 5

## 2024-03-04 MED ORDER — LACTATED RINGERS IV SOLN: INTRAVENOUS | Status: DC

## 2024-03-04 MED ORDER — ONDANSETRON HCL 4 MG/2ML IJ SOLN: 4.0000 mg | Freq: Four times a day (QID) | INTRAMUSCULAR | Status: DC | PRN

## 2024-03-04 MED ORDER — LIDOCAINE HCL (PF) 2 % IJ SOLN
INTRAMUSCULAR | Status: AC
  Filled 2024-03-04: qty 5

## 2024-03-04 MED ORDER — FENTANYL CITRATE (PF) 100 MCG/2ML IJ SOLN
INTRAMUSCULAR | Status: DC | PRN
  Administered 2024-03-04 (×2): 25 ug via INTRAVENOUS

## 2024-03-04 MED ORDER — DICLOFENAC SUPPOSITORY 100 MG
RECTAL | Status: AC
  Filled 2024-03-04: qty 1

## 2024-03-04 MED ORDER — CHLORHEXIDINE GLUCONATE CLOTH 2 % EX PADS
6.0000 | MEDICATED_PAD | Freq: Every day | CUTANEOUS | Status: DC
  Administered 2024-03-04 – 2024-03-06 (×3): 6 via TOPICAL

## 2024-03-04 MED ORDER — ONDANSETRON HCL 4 MG/2ML IJ SOLN
4.0000 mg | Freq: Once | INTRAMUSCULAR | Status: AC
  Administered 2024-03-04: 4 mg via INTRAVENOUS
  Filled 2024-03-04: qty 2

## 2024-03-04 MED ORDER — PHENYLEPHRINE 80 MCG/ML (10ML) SYRINGE FOR IV PUSH (FOR BLOOD PRESSURE SUPPORT)
PREFILLED_SYRINGE | INTRAVENOUS | Status: AC
  Filled 2024-03-04: qty 10

## 2024-03-04 MED ORDER — FENTANYL CITRATE (PF) 100 MCG/2ML IJ SOLN
INTRAMUSCULAR | Status: AC
  Filled 2024-03-04: qty 2

## 2024-03-04 MED ORDER — SODIUM CHLORIDE 0.9% FLUSH
10.0000 mL | Freq: Two times a day (BID) | INTRAVENOUS | Status: DC
  Administered 2024-03-04 – 2024-03-05 (×2): 10 mL

## 2024-03-04 MED ORDER — SODIUM CHLORIDE 0.9% FLUSH: 10.0000 mL | INTRAVENOUS | Status: DC | PRN

## 2024-03-04 MED ORDER — PANTOPRAZOLE SODIUM 40 MG PO TBEC
40.0000 mg | DELAYED_RELEASE_TABLET | Freq: Every day | ORAL | Status: DC
  Administered 2024-03-04 – 2024-03-06 (×3): 40 mg via ORAL
  Filled 2024-03-04 (×3): qty 1

## 2024-03-04 MED ORDER — FENTANYL CITRATE (PF) 50 MCG/ML IJ SOSY
50.0000 ug | PREFILLED_SYRINGE | Freq: Once | INTRAMUSCULAR | Status: AC
  Administered 2024-03-04: 50 ug via INTRAVENOUS
  Filled 2024-03-04: qty 1

## 2024-03-04 MED ORDER — DICLOFENAC SUPPOSITORY 100 MG
100.0000 mg | Freq: Once | RECTAL | Status: AC
  Administered 2024-03-04: 100 mg via RECTAL

## 2024-03-04 MED ORDER — DIPHENHYDRAMINE HCL 25 MG PO CAPS
25.0000 mg | ORAL_CAPSULE | Freq: Four times a day (QID) | ORAL | Status: DC | PRN
Start: 2024-03-04 — End: 2024-03-06

## 2024-03-04 MED ORDER — ACETAMINOPHEN 650 MG RE SUPP: 650.0000 mg | Freq: Four times a day (QID) | RECTAL | Status: DC | PRN

## 2024-03-04 MED ORDER — BOOST / RESOURCE BREEZE PO LIQD CUSTOM
1.0000 | Freq: Three times a day (TID) | ORAL | Status: DC
  Administered 2024-03-04 – 2024-03-05 (×3): 1 via ORAL

## 2024-03-04 MED ORDER — PHYTONADIONE 5 MG PO TABS
5.0000 mg | ORAL_TABLET | Freq: Once | ORAL | Status: AC
  Administered 2024-03-04: 5 mg via ORAL
  Filled 2024-03-04 (×2): qty 1

## 2024-03-04 MED ORDER — EPHEDRINE SULFATE-NACL 50-0.9 MG/10ML-% IV SOSY
PREFILLED_SYRINGE | INTRAVENOUS | Status: DC | PRN
  Administered 2024-03-04 (×6): 5 mg via INTRAVENOUS

## 2024-03-04 MED ORDER — CHOLESTYRAMINE 4 G PO PACK
4.0000 g | PACK | Freq: Three times a day (TID) | ORAL | Status: DC
  Administered 2024-03-04 – 2024-03-06 (×6): 4 g via ORAL
  Filled 2024-03-04 (×8): qty 1

## 2024-03-04 MED ORDER — LORATADINE 10 MG PO TABS
10.0000 mg | ORAL_TABLET | Freq: Every day | ORAL | Status: DC
  Administered 2024-03-05 – 2024-03-06 (×2): 10 mg via ORAL
  Filled 2024-03-04 (×3): qty 1

## 2024-03-04 MED ORDER — PIPERACILLIN-TAZOBACTAM 3.375 G IVPB
3.3750 g | Freq: Three times a day (TID) | INTRAVENOUS | Status: DC
Start: 2024-03-04 — End: 2024-03-05
  Administered 2024-03-04 – 2024-03-05 (×3): 3.375 g via INTRAVENOUS
  Filled 2024-03-04 (×3): qty 50

## 2024-03-04 MED ORDER — ACETAMINOPHEN 325 MG PO TABS: 650.0000 mg | ORAL_TABLET | Freq: Four times a day (QID) | ORAL | Status: DC | PRN

## 2024-03-04 MED ORDER — SODIUM CHLORIDE 0.9 % IV SOLN: INTRAVENOUS | Status: DC

## 2024-03-04 NOTE — Op Note (Signed)
 [facility] Patient Name: Jared Gutierrez Procedure Date: 03/04/2024 12:07 PM MRN: [id number] Account #: [id number] Date of Birth: [birth year] Admit Type: Inpatient Age: 83 Room: [facility] ENDO ROOM 4 Gender: Male Note Status: Finalized Instrument Name: CELINDA [id number] Procedure:             ERCP Indications:           Malignant stricture of the common bile duct Providers:             Rogelia Copping MD, MD Referring MD:          No Local Md, MD (Referring MD) Medicines:             General Anesthesia Complications:         No immediate complications. Procedure:             Pre-Anesthesia Assessment:                        - Prior to the procedure, a History and Physical was                         performed, and patient medications and allergies were                         reviewed. The patient's tolerance of previous                         anesthesia was also reviewed. The risks and benefits                         of the procedure and the sedation options and risks                         were discussed with the patient. All questions were                         answered, and informed consent was obtained. Prior                         Anticoagulants: The patient has taken no anticoagulant                         or antiplatelet agents. ASA Grade Assessment: IV - A                         patient with severe systemic disease that is a                         constant threat to life. After reviewing the risks and                         benefits, the patient was deemed in satisfactory                         condition to undergo the procedure.                        After obtaining informed consent, the scope was passed  under direct vision. Throughout the procedure, the                         patient's blood pressure, pulse, and oxygen                         saturations were monitored continuously. The                          Duodenoscope was introduced through the mouth, and                         used to inject contrast into and used to inject                         contrast into the bile duct. The ERCP was accomplished                         without difficulty. The patient tolerated the                         procedure well. Findings:      The scout film was normal. The esophagus was successfully intubated       under direct vision. The scope was advanced to a normal major papilla in       the descending duodenum without detailed examination of the pharynx,       larynx and associated structures, and upper GI tract. The upper GI tract       was grossly normal. The bile duct was deeply cannulated with the       short-nosed traction sphincterotome. Contrast was injected. I personally       interpreted the bile duct images. There was brisk flow of contrast       through the ducts. Image quality was excellent. Contrast extended to the       hepatic ducts. The upper third of the main bile duct was diffusely       dilated. The lower third of the main bile duct contained a single       segmental stenosis. A wire was passed into the biliary tree. One 7 Fr by       7 cm plastic stent with a single external flap and a single internal       flap was placed 5 cm into the common bile duct. Bile flowed through the       stent. The stent was in good position. 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 The stricture                         was malignant appearing.                        - The upper third of the main bile duct was dilated.                        -  One plastic stent was placed into the common bile                         duct.                        - Patients INR was 1.9 therefore a sphinterotomy was                         not done. Recommendation:        - Return patient to hospital ward for ongoing care.                         - Clear liquid diet.                        - Watch for pancreatitis, bleeding, perforation, and                         cholangitis.                        - Repeat ERCP in 3 months to exchange stent. Procedure Code(s):     --- Professional ---                        [id number], Endoscopic retrograde cholangiopancreatography                         (ERCP); with placement of endoscopic stent into                         biliary or pancreatic duct, including pre- and                         post-dilation and guide wire passage, when performed,                         including sphincterotomy, when performed, each stent                        [id number], Endoscopic catheterization of the biliary                         ductal system, radiological supervision and                         interpretation Diagnosis Code(s):     --- Professional ---                        K83.1, Obstruction of bile duct CPT copyright 2022 [employer]. All rights reserved. The codes documented in this report are preliminary and upon coder review may  be revised to meet current compliance requirements. Rogelia Copping MD, MD 03/04/2024 1:41:23 PM This report has been signed electronically. Number of Addenda: 0 Note Initiated On: 03/04/2024 12:07 PM Estimated Blood Loss:  Estimated blood loss: none.      [facility]

## 2024-03-04 NOTE — Consult Note (Signed)
 Hematology/Oncology Consult note [phone] [phone]      Patient Care Team: Sadie Manna, MD as PCP - General (Internal Medicine) Darliss Rogue, MD as PCP - Cardiology (Cardiology) Sadie Manna, MD (Internal Medicine) Jeri Donnice Burkes, MD as Referring Physician (Gastroenterology) Maurie Rayfield BIRCH, RN as Oncology Nurse Navigator Babara Call, MD as Consulting Physician (Oncology) Lenn Aran, MD as Consulting Physician (Radiation Oncology)   Name of the patient: Jared Gutierrez  [id number]  11-Apr-1941   REASON FOR COSULTATION:   History of presenting illness-  83 y.o. male with PMH listed at below who was sent to emergency room due to jaundice, weakness and abnormal CT scan.  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 Family has noticed the patient being weak and also yellow skin since the beginning of October 2025. 02/27/2024, CT chest abdomen pelvis with contrast showed new 3 x 5 mm solid left upper lobe nodule.  New moderate intrahepatic duct dilatation as well as moderate to severe extrahepatic bile duct dilatation.  Gallbladder is distended.  In the emergency room, patient's blood work showed a bilirubin of 28.  AST 188, enoximone 102, alkaline phosphatase 342.  MRI abdomen MRCP with and without contrast showed pancreatic mass 4 x 2.6 cm, causing obstruction of the CBD and the main pancreatic duct.  Vascular involvement with encasement of the superior mesenteric artery and the celiac artery.  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  Patient is status post ERCP today. Single segmental biliary stricture was found in CBD and a plastic stent was placed.  INR 1.9, so sphincterotomy was not done.  Wife and daughter are at bedside.  Appetite is poor.  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    Priority: High   Gastritis without bleeding 12/31/2023    Priority: Medium    Diarrhea 12/04/2023    Priority: Medium    Lung nodules 01/16/2023    Priority: Medium    Weight loss 12/19/2022    Priority: Medium    Hypotension due to hypovolemia 05/18/2021    Priority: Medium    Anemia due to chemotherapy 12/02/2020    Priority: Medium    Encounter for antineoplastic chemotherapy 06/25/2019    Priority: Medium    GERD (gastroesophageal reflux disease) 09/18/2023    Priority: Low   UTI (urinary tract infection) 08/28/2023    Priority: Low   Hyponatremia 08/14/2023    Priority: Low   Bilateral leg edema 01/30/2023    Priority: Low   Subconjunctival hemorrhage 09/12/2022    Priority: Low   Epistaxis 07/04/2022    Priority: Low   Elevated PSA 01/03/2022    Priority: Low   Constipation 10/26/2021    Priority: Low   Chemotherapy induced diarrhea 09/28/2021    Priority: Low   Port-A-Cath in place 03/08/2021    Priority: Low   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    Priority: Low   Hypokalemia 07/23/2019    Priority: Low   Goals of care, counseling/discussion 06/25/2019    Priority: Low   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    Priority: Low   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   Obstructive jaundice (HCC) 03/04/2024   Elevated LFTs 03/04/2024   Pancreatic mass 12/31/2023   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   Abdominal pain 12/04/2023   HTN (hypertension)  12/04/2023   Obesity (BMI 30-39.9) 12/04/2023   BPH (benign prostatic hyperplasia) 03/28/2022   Need for prophylactic vaccination and inoculation against influenza 03/28/2022   Aortic atherosclerosis 08/02/2020   Hepatic steatosis 08/02/2020   Chemotherapy-induced neutropenia 12/20/2019   Cellulitis of left lower extremity 11/03/2019   Pseudogout of foot, left 11/03/2019   Hypomagnesemia 10/29/2019   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   Sepsis (HCC) 08/16/2019   Cellulitis of right lower extremity 08/16/2019   Maintenance chemotherapy 08/16/2019   Lesion of right lung 07/01/2019   Nephrolithiasis 07/01/2019   Osteoarthritis 07/01/2019   Morbid obesity with BMI of 40.0-44.9, adult (HCC) 01/27/2019   Elevated CEA 12/24/2018   Hypertension 05/10/2016   Primary osteoarthritis of left knee 02/05/2014   History of colon cancer, stage I 10/17/2013     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   Chicken pox    Colon cancer (HCC)    History of kidney stones    Hypertension    Lipoma of colon    Nephrolithiasis    Nephrolithiasis    Obesity    Shingles    Tubular adenoma of colon    multiple fragments     Past Surgical History:  Procedure Laterality Date   BILIARY STENT PLACEMENT  03/04/2024   Procedure: INSERTION, STENT, BILE DUCT;  Surgeon: Jinny Carmine, MD;  Location: [facility] ENDOSCOPY;  Service: Endoscopy;;   COLON SURGERY     COLONOSCOPY N/A 10/02/2014   Procedure: COLONOSCOPY;  Surgeon: Donnice Vaughn Manes, MD;  Location: [facility] ENDOSCOPY;  Service: Endoscopy;  Laterality: N/A;   COLONOSCOPY WITH PROPOFOL  N/A 02/16/2017   Procedure: COLONOSCOPY WITH PROPOFOL ;  Surgeon: Viktoria Lamar DASEN, MD;  Location: [facility] ENDOSCOPY;  Service: Endoscopy;  Laterality: N/A;   COLONOSCOPY WITH PROPOFOL  N/A 10/05/2020   Procedure: COLONOSCOPY WITH PROPOFOL ;  Surgeon: Maryruth Ole DASEN, MD;  Location: [facility] ENDOSCOPY;  Service: Endoscopy;  Laterality: N/A;   ERCP N/A 03/04/2024   Procedure: ERCP, WITH INTERVENTION IF INDICATED;  Surgeon: Jinny Carmine, MD;  Location: [facility] ENDOSCOPY;  Service: Endoscopy;  Laterality: N/A;   ESOPHAGOGASTRODUODENOSCOPY N/A 12/31/2023   Procedure: EGD (ESOPHAGOGASTRODUODENOSCOPY);  Surgeon: Wilhelmenia Aloha Raddle., MD;  Location: [facility];  Service: Gastroenterology;  Laterality: N/A;    EUS N/A 04/17/2019   Procedure: FULL UPPER ENDOSCOPIC ULTRASOUND (EUS) RADIAL;  Surgeon: Queenie Asberry LABOR, MD;  Location: [facility] ENDOSCOPY;  Service: Gastroenterology;  Laterality: N/A;   EUS N/A 12/31/2023   Procedure: ULTRASOUND, UPPER GI TRACT, ENDOSCOPIC;  Surgeon: Wilhelmenia Aloha Raddle., MD;  Location: [facility] ENDOSCOPY;  Service: Gastroenterology;  Laterality: N/A;   FINE NEEDLE ASPIRATION  12/31/2023   Procedure: FINE NEEDLE ASPIRATION;  Surgeon: Wilhelmenia Aloha Raddle., MD;  Location: [facility] ENDOSCOPY;  Service: Gastroenterology;;   KIDNEY STONE SURGERY     PARTIAL COLECTOMY  10/17/2013   PORTACATH PLACEMENT Right 06/13/2019   Procedure: INSERTION PORT-A-CATH;  Surgeon: Tye Millet, DO;  Location: [facility] ORS;  Service: General;  Laterality: Right;    Social History   Socioeconomic History   Marital status: Married    Spouse name: Not on file   Number of children: Not on file   Years of education: Not on file   Highest education level: Not on file  Occupational History   Not on file  Tobacco Use   Smoking status: Never   Smokeless tobacco: Never  Vaping Use  Vaping status: Never Used  Substance and Sexual Activity   Alcohol use: Not Currently    Comment: socially    Drug use: No   Sexual activity: Not on file  Other Topics Concern   Not on file  Social History Narrative   Not on file   Social Drivers of Health   Financial Resource Strain: Not on file  Food Insecurity: No Food Insecurity (03/04/2024)   Hunger Vital Sign    Worried About Running Out of Food in the Last Year: Never true    Ran Out of Food in the Last Year: Never true  Transportation Needs: No Transportation Needs (03/04/2024)   PRAPARE - Administrator, Civil Service (Medical): No    Lack of Transportation (Non-Medical): No  Physical Activity: Not on file  Stress: Not on file  Social Connections: Socially Integrated (03/04/2024)   Social Connection and Isolation Panel    Frequency of  Communication with Friends and Family: More than three times a week    Frequency of Social Gatherings with Friends and Family: More than three times a week    Attends Religious Services: 1 to 4 times per year    Active Member of [employer] or Organizations: No    Attends Engineer, structural: 1 to 4 times per year    Marital Status: Married  Catering manager Violence: Not At Risk (03/04/2024)   Humiliation, Afraid, Rape, and Kick questionnaire    Fear of Current or Ex-Partner: No    Emotionally Abused: No    Physically Abused: No    Sexually Abused: No     Family History  Problem Relation Age of Onset   Cancer Mother    Breast cancer Mother    COPD Father      Current Facility-Administered Medications:    0.9 %  sodium chloride  infusion, , Intravenous, Continuous, Wohl, Darren, MD, Last Rate: 75 mL/hr at 03/04/24 0151, New Bag at 03/04/24 0151   acetaminophen  (TYLENOL ) tablet 650 mg, 650 mg, Oral, Q6H PRN **OR** acetaminophen  (TYLENOL ) suppository 650 mg, 650 mg, Rectal, Q6H PRN, Laurita Cort DASEN, MD   Chlorhexidine  Gluconate Cloth 2 % PADS 6 each, 6 each, Topical, Daily, Zhang, Cort DASEN, MD   cholestyramine ORMA) packet 4 g, 4 g, Oral, TID, Jinny Carmine, MD, 4 g at 03/04/24 1712   diphenhydrAMINE  (BENADRYL ) capsule 25 mg, 25 mg, Oral, Q6H PRN, Jinny, Darren, MD   feeding supplement (BOOST / RESOURCE BREEZE) liquid 1 Container, 1 Container, Oral, TID BM, Zhang, Ping T, MD   HYDROmorphone (DILAUDID) injection 0.5-1 mg, 0.5-1 mg, Intravenous, Q2H PRN, Laurita Cort DASEN, MD   [START ON 03/05/2024] Influenza vac split trivalent PF (FLUZONE HIGH-DOSE) injection 0.5 mL, 0.5 mL, Intramuscular, Tomorrow-1000, Zhang, Ping T, MD   loratadine (CLARITIN) tablet 10 mg, 10 mg, Oral, Daily, Zhang, Ping T, MD   ondansetron  (ZOFRAN ) tablet 4 mg, 4 mg, Oral, Q6H PRN **OR** ondansetron  (ZOFRAN ) injection 4 mg, 4 mg, Intravenous, Q6H PRN, Laurita Cort T, MD   pantoprazole  (PROTONIX ) EC tablet 40 mg, 40  mg, Oral, Daily, Laurita Cort T, MD, 40 mg at 03/04/24 1711   piperacillin-tazobactam (ZOSYN) IVPB 3.375 g, 3.375 g, Intravenous, Q8H, Wohl, Darren, MD, Last Rate: 12.5 mL/hr at 03/04/24 1041, 3.375 g at 03/04/24 1041   [START ON 03/05/2024] pneumococcal 20-valent conjugate vaccine (PREVNAR 20) injection 0.5 mL, 0.5 mL, Intramuscular, Tomorrow-1000, Wohl, Darren, MD   sodium chloride  flush (NS) 0.9 % injection 10-40 mL, 10-40 mL,  Intracatheter, Q12H, Laurita Cort DASEN, MD   sodium chloride  flush (NS) 0.9 % injection 10-40 mL, 10-40 mL, Intracatheter, PRN, Zhang, Ping T, MD  Facility-Administered Medications Ordered in Other Encounters:    0.9 %  sodium chloride  infusion, , Intravenous, Once, Corcoran, Melissa C, MD   0.9 %  sodium chloride  infusion, , Intravenous, Continuous, Corcoran, Melissa C, MD, Last Rate: 10 mL/hr at 12/31/23 1008, Continued from Pre-op at 12/31/23 1008   heparin  lock flush 100 unit/mL, 500 Units, Intravenous, Once, Corcoran, Melissa C, MD   sodium chloride  flush (NS) 0.9 % injection 10 mL, 10 mL, Intracatheter, PRN, Babara Call, MD, 10 mL at 02/01/23 1341  Review of Systems  Constitutional:  Positive for appetite change, fatigue and unexpected weight change. Negative for chills and fever.  HENT:   Negative for hearing loss and voice change.   Eyes:  Positive for icterus. Negative for eye problems.  Respiratory:  Negative for chest tightness, cough and shortness of breath.   Cardiovascular:  Negative for chest pain and leg swelling.  Gastrointestinal:  Positive for abdominal pain. Negative for abdominal distention, nausea and vomiting.  Endocrine: Negative for hot flashes.  Genitourinary:  Negative for difficulty urinating, dysuria and frequency.   Musculoskeletal:  Negative for arthralgias.  Skin:  Negative for itching and rash.       Jaundice  Neurological:  Negative for light-headedness and numbness.  Hematological:  Negative for adenopathy. Does not bruise/bleed easily.   Psychiatric/Behavioral:  Negative for confusion.     PHYSICAL EXAM Vitals:   03/04/24 0919 03/04/24 1348 03/04/24 1358 03/04/24 1408  BP: (!) 117/59 126/76 112/72 115/66  Pulse: (!) 53 72    Resp: 18 17 10 14   Temp: 97.8 F (36.6 C) (!) 97.5 F (36.4 C)    TempSrc:  Temporal    SpO2: 100% 100% 100% 100%  Weight:      Height:         Physical Exam Constitutional:      General: He is not in acute distress.    Appearance: He is not diaphoretic.  HENT:     Head: Normocephalic and atraumatic.  Eyes:     General: No scleral icterus. Cardiovascular:     Rate and Rhythm: Normal rate and regular rhythm.     Heart sounds: No murmur heard. Pulmonary:     Effort: Pulmonary effort is normal. No respiratory distress.     Breath sounds: Normal breath sounds.  Abdominal:     General: Bowel sounds are normal. There is no distension.     Palpations: Abdomen is soft.  Musculoskeletal:        General: Normal range of motion.     Cervical back: Normal range of motion and neck supple.  Skin:    General: Skin is warm and dry.     Findings: No erythema.  Neurological:     Mental Status: He is alert. Mental status is at baseline.     Cranial Nerves: No cranial nerve deficit.     Motor: No abnormal muscle tone.  Psychiatric:        Mood and Affect: Affect normal.      LABORATORY STUDIES    Latest Ref Rng & Units 03/03/2024    6:07 PM 01/17/2024   12:09 PM 12/18/2023   10:02 AM  CBC  WBC 4.0 - 10.5 K/uL 4.9  5.9  5.5   Hemoglobin 13.0 - 17.0 g/dL 89.7  88.6  89.9   Hematocrit 39.0 -  52.0 % 28.8  34.0  29.4   Platelets 150 - 400 K/uL 189  181  185       Latest Ref Rng & Units 03/03/2024    6:07 PM 01/17/2024   12:09 PM 12/18/2023   10:02 AM  CMP  Glucose 70 - 99 mg/dL 890  893  848   BUN 8 - 23 mg/dL 7  11  18    Creatinine 0.61 - 1.24 mg/dL <9.69  9.19  9.36   Sodium 135 - 145 mmol/L 138  135  135   Potassium 3.5 - 5.1 mmol/L 3.5  3.3  3.5   Chloride 98 - 111 mmol/L 106  107   111   CO2 22 - 32 mmol/L 23  19  18    Calcium  8.9 - 10.3 mg/dL 9.4  9.4  8.9   Total Protein 6.5 - 8.1 g/dL 6.0  6.6  5.9   Total Bilirubin 0.0 - 1.2 mg/dL 71.1  1.0  1.2   Alkaline Phos 38 - 126 U/L 342  58  47   AST 15 - 41 U/L 188  32  27   ALT 0 - 44 U/L 102  16  15      RADIOGRAPHIC STUDIES: I have personally reviewed the radiological images as listed and agreed with the findings in the report. DG C-Arm 1-60 Min-No Report Result Date: 03/04/2024 Fluoroscopy was utilized by the requesting physician.  No radiographic interpretation.   MR ABDOMEN MRCP W WO CONTAST Result Date: 03/04/2024 EXAM: MRCP WITH AND WITHOUT IV CONTRAST 03/04/2024 01:05:40 AM TECHNIQUE: Multisequence, multiplanar magnetic resonance images of the abdomen with and without intravenous contrast. MRCP sequences were performed. 8 mL gadobutrol (GADAVIST) 1 MMOL/ML injection. COMPARISON: CT 02/27/2024 exam detail is diminished by motion artifact. CLINICAL HISTORY: Jaundice. Patient sent to ED by Dr Babara due to pt having a distended gallbladder and is jaundice. Currently has colon cancer. FINDINGS: LIVER: As noted previously, there is atrophy involving the left lobe of liver. GALLBLADDER AND BILIARY SYSTEM: Moderate gallbladder distention. No significant gallbladder wall thickening. No gallstones identified. Marked intrahepatic bile duct dilatation. The proximal common bile duct measures up to 1.7 cm in diameter, image 20/3. No choledocholithiasis. Abrupt termination of the dilated common bile duct is noted at the level of the head of the pancreas. SPLEEN: Unremarkable. PANCREAS/PANCREATIC DUCT: There is main duct dilatation of the pancreas from the body through the tail. Obscured by motion artifact, a mass is suspected between the head and body of pancreas which obstructs the common bile duct and the main pancreatic duct as suggested on axial image 16/26 and coronal image 20/3. This measures approximately 4.0 x 2.6 cm, image  36/80. ADRENAL GLANDS: Normal size and morphology bilaterally. No nodule, thickening, or hemorrhage. No periadrenal stranding. KIDNEYS: Bilateral kidney cysts. No follow-up imaging recommended. The largest is in the lower pole of the right kidney measuring 2.8 cm, image 48/25. LYMPH NODES: No upper abdominal adenopathy. VASCULATURE: There appears to be encasement of the superior mesenteric artery. There is also encasement of the celiac artery. 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 Aortic atherosclerotic calcification. PERITONEUM: Trace ascites. A small volume of free fluid extends over the liver. ABDOMINAL WALL: No hernia. No mass. BOWEL: There is gaseous distention of the colon up to the level of the rectum. No bowel obstruction. BONES: No acute abnormality or worrisome osseous lesion. SOFT TISSUES: Unremarkable. MISCELLANEOUS: Unremarkable. IMPRESSION: 1. Pancreatic mass between the head and body  measuring approximately 4.0 x 2.6 cm causing obstruction of the common bile duct and main pancreatic duct; evaluation limited by motion artifact. 2. Vascular involvement with encasement of the superior mesenteric artery and celiac artery. 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 Electronically signed by: Waddell Calk MD 03/04/2024 06:02 AM EDT RP Workstation: [id number]   MR 3D Recon At Scanner Result Date: 03/04/2024 EXAM: MRCP WITH AND WITHOUT IV CONTRAST 03/04/2024 01:05:40 AM TECHNIQUE: Multisequence, multiplanar magnetic resonance images of the abdomen with and without intravenous contrast. MRCP sequences were performed. 8 mL gadobutrol (GADAVIST) 1 MMOL/ML injection. COMPARISON: CT 02/27/2024 exam detail is diminished by motion artifact. CLINICAL HISTORY: Jaundice. Patient sent to ED by Dr Babara due to pt having a distended gallbladder and is jaundice. Currently has colon cancer. FINDINGS: LIVER: As noted previously, there  is atrophy involving the left lobe of liver. GALLBLADDER AND BILIARY SYSTEM: Moderate gallbladder distention. No significant gallbladder wall thickening. No gallstones identified. Marked intrahepatic bile duct dilatation. The proximal common bile duct measures up to 1.7 cm in diameter, image 20/3. No choledocholithiasis. Abrupt termination of the dilated common bile duct is noted at the level of the head of the pancreas. SPLEEN: Unremarkable. PANCREAS/PANCREATIC DUCT: There is main duct dilatation of the pancreas from the body through the tail. Obscured by motion artifact, a mass is suspected between the head and body of pancreas which obstructs the common bile duct and the main pancreatic duct as suggested on axial image 16/26 and coronal image 20/3. This measures approximately 4.0 x 2.6 cm, image 36/80. ADRENAL GLANDS: Normal size and morphology bilaterally. No nodule, thickening, or hemorrhage. No periadrenal stranding. KIDNEYS: Bilateral kidney cysts. No follow-up imaging recommended. The largest is in the lower pole of the right kidney measuring 2.8 cm, image 48/25. LYMPH NODES: No upper abdominal adenopathy. VASCULATURE: There appears to be encasement of the superior mesenteric artery. There is also encasement of the celiac artery. 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 Aortic atherosclerotic calcification. PERITONEUM: Trace ascites. A small volume of free fluid extends over the liver. ABDOMINAL WALL: No hernia. No mass. BOWEL: There is gaseous distention of the colon up to the level of the rectum. No bowel obstruction. BONES: No acute abnormality or worrisome osseous lesion. SOFT TISSUES: Unremarkable. MISCELLANEOUS: Unremarkable. IMPRESSION: 1. Pancreatic mass between the head and body measuring approximately 4.0 x 2.6 cm causing obstruction of the common bile duct and main pancreatic duct; evaluation limited by motion artifact. 2. Vascular involvement with  encasement of the superior mesenteric artery and celiac artery. 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 Electronically signed by: Waddell Calk MD 03/04/2024 06:02 AM EDT RP Workstation: [id number]   CT CHEST ABDOMEN PELVIS W CONTRAST Result Date: 02/27/2024 CLINICAL DATA:  colon cancer. * Tracking Code: BO * EXAM: CT CHEST, ABDOMEN, AND PELVIS WITH CONTRAST TECHNIQUE: Multidetector CT imaging of the chest, abdomen and pelvis was performed following the standard protocol during bolus administration of intravenous contrast. RADIATION DOSE REDUCTION: This exam was performed according to the departmental dose-optimization program which includes automated exposure control, adjustment of the mA and/or kV according to patient size and/or use of iterative reconstruction technique. CONTRAST:  OMNIPAQUE  IOHEXOL  300 MG/ML  SOLN COMPARISON:  CT scan abdomen and pelvis from 12/04/2023 and CT scan chest, abdomen and pelvis from 10/30/2023. FINDINGS: CT CHEST FINDINGS Cardiovascular: Normal cardiac size. No pericardial effusion. No aortic aneurysm. There are coronary artery calcifications, in  keeping with coronary artery disease. There are also mild peripheral atherosclerotic vascular calcifications of thoracic aorta and its major branches. Aberrant origin of right subclavian artery noted, which arises distal to the left subclavian artery and courses towards the right side, posterior to the esophagus. Mediastinum/Nodes: Visualized thyroid  gland appears grossly unremarkable. No solid / cystic mediastinal masses. The esophagus is nondistended precluding optimal assessment. No axillary, mediastinal or hilar lymphadenopathy by size criteria. Lungs/Pleura: The central tracheo-bronchial tree is patent. Redemonstration of combination of emphysematous changes as well as bronchiectatic changes mainly at bilateral lung bases, right more than left. No mass or consolidation. No pleural  effusion or pneumothorax. There is a new, 3 x 5 mm solid noncalcified nodule in the left upper lobe (series 3, image 49). There is a stable previously seen sub 4 mm noncalcified nodule in the right upper lobe (series 3, image 56). Musculoskeletal: A CT Port-a-Cath is seen in the right upper chest wall with the catheter terminating in the right atrium. Visualized soft tissues of the chest wall are otherwise grossly unremarkable. No suspicious osseous lesions. There are mild multilevel degenerative changes in the visualized spine. CT ABDOMEN PELVIS FINDINGS Hepatobiliary: The liver is normal in size. Non-cirrhotic configuration. No suspicious mass. Since the prior study, there is new moderate intrahepatic bile duct dilation as well as moderate-to-severe extrahepatic bile duct dilation. Gallbladder is distended measuring up to 5.7 cm in width. No abnormal wall thickening or pericholecystic fat stranding. No calcified gallstones. Pancreas: Redemonstration of heterogeneous appearance of the pancreas. There is a relatively well-circumscribed 2.1 x 2.2 cm hypoattenuating lesion in the proximal body, which appears grossly similar to the prior study. Upstream to it, there is atrophy of pancreas with prominent main pancreatic duct however, grossly similar to the prior study. There is diffuse mild atrophy of the pancreatic head/uncinate process as well. There is a single dystrophic calcification in the pancreatic body, nonspecific but commonly seen as a sequela of chronic pancreatitis. However, no acute peripancreatic fat stranding noted. Spleen: Within normal limits. No focal lesion. Adrenals/Urinary Tract: Adrenal glands are unremarkable. No suspicious renal mass. There are at least 2 simple cysts in the right kidney lower pole with larger cyst measuring up to 2.9 x 4.2 cm. There is a sinus cyst in the left kidney interpolar region. No nephroureterolithiasis or obstructive uropathy. Urinary bladder is under distended,  precluding optimal assessment. However, no large mass or stones identified. No perivesical fat stranding. Stomach/Bowel: No disproportionate dilation of the small or large bowel loops. There is apparent mild irregular wall thickening of the left paramedian transverse colon (series 2, image 58), which is likely accentuated by underdistention. Findings are nonspecific but can be seen with primary or secondary colitis or other etiologies including subserosal metastatic deposits or primary malignancy. Correlate clinically. This area is new since the prior study from 12/04/2023. No other evidence of abnormal bowel wall thickening or inflammatory changes. The appendix was not visualized; however there is no acute inflammatory process in the right lower quadrant. Vascular/Lymphatic: No ascites or pneumoperitoneum. No abdominal or pelvic lymphadenopathy, by size criteria. No aneurysmal dilation of the major abdominal arteries. Reproductive: Enlarged prostate. Symmetric seminal vesicles. Other: Midline surgical scar noted. There is a small fat containing umbilical hernia. The soft tissues and abdominal wall are otherwise unremarkable. Musculoskeletal: No suspicious osseous lesions. There are mild multilevel degenerative changes in the visualized spine. IMPRESSION: 1. There is a new 3 x 5 mm solid noncalcified nodule in the left upper lobe. Otherwise, no metastatic  disease identified within the chest. 2. There is apparent mild irregular wall thickening of the left paramedian transverse colon, which is likely accentuated by underdistention. Findings are nonspecific but can be seen with primary or secondary colitis or other etiologies including neoplastic process. 3. There is new moderate intrahepatic bile duct dilation as well as moderate-to-severe extrahepatic bile duct dilation. Gallbladder is distended measuring up to 5.7 cm in width. No abnormal wall thickening or pericholecystic fat stranding. No calcified gallstones.  Correlate clinically and with liver function tests to determine the need for further evaluation with MRI/MRCP. 4. Redemonstration of heterogeneous hypoattenuating mass centered in the proximal pancreatic body. 5. Multiple other nonacute observations, as described above. Electronically Signed   By: Ree Molt M.D.   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 Previously on chemotherapy break. Continue supportive care.  Consider resuming chemotherapy outpatient if he is able to recover to acceptable condition for further treatments. If his performance status continues to decline, hospice/comfort care is appropriate.  Thank you for allowing me to participate in the care of this patient.   Zelphia Cap, MD, PhD Hematology Oncology 03/04/2024

## 2024-03-04 NOTE — Consult Note (Signed)
 Rogelia Copping, MD [facility]  [address] [place], [place] [address] Phone: [phone] Fax : [phone]  Consultation  Referring Provider:     Dr. Laurita Primary Care Physician:  Sadie Manna, MD Primary Gastroenterologist:  Dr. Maryruth         Reason for Consultation:     Obstructive jaundice  Date of Admission:  03/03/2024 Date of Consultation:  03/04/2024         HPI:   Jared Gutierrez is a 83 y.o. male with a history of metastatic colon cancer who came in with obstructive jaundice and an EUS by Dr. Wilhelmenia in August with cells consistent with colon cancer metastasis.  The patient also had a EUS done in 2020 with the same findings by Dr. Queenie. The patient came in with a bilirubin of 28.8 with elevated AST and ALT and his liver enzymes were normal 1 month ago.  The patient had a MRCP that showed:  IMPRESSION: 1. Pancreatic mass between the head and body measuring approximately 4.0 x 2.6 cm causing obstruction of the common bile duct and main pancreatic duct; evaluation limited by motion artifact. 2. Vascular involvement with encasement of the superior mesenteric artery and celiac artery. 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  The patient reports that he had chronic abdominal pain which has gotten worse recently.  The patient had noticed his urine getting darker and his skin getting more yellow.  The patient's INR yesterday was 1.6 and a repeat this morning showed it to be 1.9.  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   Chicken pox    Colon cancer (HCC)    History of kidney stones    Hypertension    Lipoma of colon    Nephrolithiasis    Nephrolithiasis    Obesity    Shingles    Tubular adenoma of colon    multiple fragments    Past Surgical History:  Procedure Laterality Date   COLON SURGERY      COLONOSCOPY N/A 10/02/2014   Procedure: COLONOSCOPY;  Surgeon: Donnice Vaughn Manes, MD;  Location: [facility] ENDOSCOPY;  Service: Endoscopy;  Laterality: N/A;   COLONOSCOPY WITH PROPOFOL  N/A 02/16/2017   Procedure: COLONOSCOPY WITH PROPOFOL ;  Surgeon: Viktoria Lamar DASEN, MD;  Location: [facility] ENDOSCOPY;  Service: Endoscopy;  Laterality: N/A;   COLONOSCOPY WITH PROPOFOL  N/A 10/05/2020   Procedure: COLONOSCOPY WITH PROPOFOL ;  Surgeon: Maryruth Ole DASEN, MD;  Location: [facility] ENDOSCOPY;  Service: Endoscopy;  Laterality: N/A;   ESOPHAGOGASTRODUODENOSCOPY N/A 12/31/2023   Procedure: EGD (ESOPHAGOGASTRODUODENOSCOPY);  Surgeon: Wilhelmenia Aloha Raddle., MD;  Location: [facility];  Service: Gastroenterology;  Laterality: N/A;   EUS N/A 04/17/2019   Procedure: FULL UPPER ENDOSCOPIC ULTRASOUND (EUS) RADIAL;  Surgeon: Queenie Asberry LABOR, MD;  Location: [facility] ENDOSCOPY;  Service: Gastroenterology;  Laterality: N/A;   EUS N/A 12/31/2023   Procedure: ULTRASOUND, UPPER GI TRACT, ENDOSCOPIC;  Surgeon: Wilhelmenia Aloha Raddle., MD;  Location: [facility] ENDOSCOPY;  Service: Gastroenterology;  Laterality: N/A;   FINE NEEDLE ASPIRATION  12/31/2023   Procedure: FINE NEEDLE ASPIRATION;  Surgeon: Wilhelmenia Aloha Raddle., MD;  Location: [facility] ENDOSCOPY;  Service: Gastroenterology;;   KIDNEY STONE SURGERY     PARTIAL COLECTOMY  10/17/2013   PORTACATH PLACEMENT Right 06/13/2019   Procedure: INSERTION PORT-A-CATH;  Surgeon: Tye Millet, DO;  Location:  [facility] ORS;  Service: General;  Laterality: Right;    Prior to Admission medications   Medication Sig Start Date End Date Taking? Authorizing Provider  cyanocobalamin (VITAMIN B12) 500 MCG tablet Take by mouth. 07/26/23 07/25/24 Yes [provider]  lidocaine -prilocaine  (EMLA ) cream Apply small amount to port and cover with saran wrap 1-2 hours prior to port access 08/27/23  Yes Babara Call, MD  loratadine (CLARITIN) 10 MG tablet Take 10 mg by mouth daily.   Yes [provider]   omeprazole  (PRILOSEC) 20 MG capsule Take 20 mg by mouth daily. 12/25/23  Yes [provider]  ondansetron  (ZOFRAN ) 8 MG tablet Take 1 tablet (8 mg total) by mouth 2 (two) times daily as needed for refractory nausea / vomiting. Start on day 3 after chemotherapy. 11/16/20  Yes Babara Call, MD  pantoprazole  (PROTONIX ) 40 MG tablet Take 1 tablet (40 mg total) by mouth every morning. Take in the morning on an empty stomach 12/07/23 03/04/24 Yes Sreenath, Calvin NOVAK, MD  Probiotic Product (PROBIOTIC DAILY PO) Take 1 tablet by mouth daily.   Yes [provider]  Cholecalciferol (VITAMIN D) 125 MCG (5000 UT) CAPS Take 5,000 mg by mouth.    [provider]    Family History  Problem Relation Age of Onset   Cancer Mother    Breast cancer Mother    COPD Father      Social History   Tobacco Use   Smoking status: Never   Smokeless tobacco: Never  Vaping Use   Vaping status: Never Used  Substance Use Topics   Alcohol use: Not Currently    Comment: socially    Drug use: No    Allergies as of 03/03/2024   (No Known Allergies)    Review of Systems:    All systems reviewed and negative except where noted in HPI.   Physical Exam:  Vital signs in last 24 hours: Temp:  [97.8 F (36.6 C)-98.4 F (36.9 C)] 97.8 F (36.6 C) (10/21 0919) Pulse Rate:  [53-73] 53 (10/21 0919) Resp:  [10-22] 18 (10/21 0919) BP: (87-124)/(51-70) 117/59 (10/21 0919) SpO2:  [100 %] 100 % (10/21 0919) Weight:  [84.8 kg] 84.8 kg (10/20 1800)   General:   Pleasant, cooperative in NAD Head:  Normocephalic and atraumatic. Eyes:   Positive icterus.   Conjunctiva yellow. PERRLA. Ears:  Normal auditory acuity. Neck:  Supple; no masses or thyroidomegaly Lungs: Respirations even and unlabored. Lungs clear to auscultation bilaterally.   No wheezes, crackles, or rhonchi.  Heart:  Regular rate and rhythm;  Without murmur, clicks, rubs or gallops Abdomen:  Soft, nondistended, nontender. Normal bowel  sounds. No appreciable masses or hepatomegaly.  No rebound or guarding.  Rectal:  Not performed. Msk:  Symmetrical without gross deformities.   Extremities:  Without edema, cyanosis or clubbing. Neurologic:  Alert and oriented x3;  grossly normal neurologically. Skin:  Intact without significant lesions or rashes. Cervical Nodes:  No significant cervical adenopathy. Psych:  Alert and cooperative. Normal affect.  LAB RESULTS: Recent Labs    03/03/24 1807  WBC 4.9  HGB 10.2*  HCT 28.8*  PLT 189   BMET Recent Labs    03/03/24 1807  NA 138  K 3.5  CL 106  CO2 23  GLUCOSE 109*  BUN 7*  CREATININE <0.30*  CALCIUM  9.4   LFT Recent Labs    03/03/24 1807  PROT 6.0*  ALBUMIN 2.4*  AST 188*  ALT 102*  ALKPHOS 342*  BILITOT  28.8*  BILIDIR 17.4*  IBILI NOT CALCULATED   PT/INR Recent Labs    03/03/24 1807 03/04/24 0754  LABPROT 20.0* 22.8*  INR 1.6* 1.9*    STUDIES: MR ABDOMEN MRCP W WO CONTAST Result Date: 03/04/2024 EXAM: MRCP WITH AND WITHOUT IV CONTRAST 03/04/2024 01:05:40 AM TECHNIQUE: Multisequence, multiplanar magnetic resonance images of the abdomen with and without intravenous contrast. MRCP sequences were performed. 8 mL gadobutrol (GADAVIST) 1 MMOL/ML injection. COMPARISON: CT 02/27/2024 exam detail is diminished by motion artifact. CLINICAL HISTORY: Jaundice. Patient sent to ED by Dr Babara due to pt having a distended gallbladder and is jaundice. Currently has colon cancer. FINDINGS: LIVER: As noted previously, there is atrophy involving the left lobe of liver. GALLBLADDER AND BILIARY SYSTEM: Moderate gallbladder distention. No significant gallbladder wall thickening. No gallstones identified. Marked intrahepatic bile duct dilatation. The proximal common bile duct measures up to 1.7 cm in diameter, image 20/3. No choledocholithiasis. Abrupt termination of the dilated common bile duct is noted at the level of the head of the pancreas. SPLEEN: Unremarkable.  PANCREAS/PANCREATIC DUCT: There is main duct dilatation of the pancreas from the body through the tail. Obscured by motion artifact, a mass is suspected between the head and body of pancreas which obstructs the common bile duct and the main pancreatic duct as suggested on axial image 16/26 and coronal image 20/3. This measures approximately 4.0 x 2.6 cm, image 36/80. ADRENAL GLANDS: Normal size and morphology bilaterally. No nodule, thickening, or hemorrhage. No periadrenal stranding. KIDNEYS: Bilateral kidney cysts. No follow-up imaging recommended. The largest is in the lower pole of the right kidney measuring 2.8 cm, image 48/25. LYMPH NODES: No upper abdominal adenopathy. VASCULATURE: There appears to be encasement of the superior mesenteric artery. There is also encasement of the celiac artery. 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 Aortic atherosclerotic calcification. PERITONEUM: Trace ascites. A small volume of free fluid extends over the liver. ABDOMINAL WALL: No hernia. No mass. BOWEL: There is gaseous distention of the colon up to the level of the rectum. No bowel obstruction. BONES: No acute abnormality or worrisome osseous lesion. SOFT TISSUES: Unremarkable. MISCELLANEOUS: Unremarkable. IMPRESSION: 1. Pancreatic mass between the head and body measuring approximately 4.0 x 2.6 cm causing obstruction of the common bile duct and main pancreatic duct; evaluation limited by motion artifact. 2. Vascular involvement with encasement of the superior mesenteric artery and celiac artery. 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 Electronically signed by: Waddell Calk MD 03/04/2024 06:02 AM EDT RP Workstation: [id number]   MR 3D Recon At Scanner Result Date: 03/04/2024 EXAM: MRCP WITH AND WITHOUT IV CONTRAST 03/04/2024 01:05:40 AM TECHNIQUE: Multisequence, multiplanar magnetic resonance images of the abdomen with and  without intravenous contrast. MRCP sequences were performed. 8 mL gadobutrol (GADAVIST) 1 MMOL/ML injection. COMPARISON: CT 02/27/2024 exam detail is diminished by motion artifact. CLINICAL HISTORY: Jaundice. Patient sent to ED by Dr Babara due to pt having a distended gallbladder and is jaundice. Currently has colon cancer. FINDINGS: LIVER: As noted previously, there is atrophy involving the left lobe of liver. GALLBLADDER AND BILIARY SYSTEM: Moderate gallbladder distention. No significant gallbladder wall thickening. No gallstones identified. Marked intrahepatic bile duct dilatation. The proximal common bile duct measures up to 1.7 cm in diameter, image 20/3. No choledocholithiasis. Abrupt termination of the dilated common bile duct is noted at the level of the head of the pancreas. SPLEEN: Unremarkable. PANCREAS/PANCREATIC DUCT: There is main duct dilatation of the pancreas  from the body through the tail. Obscured by motion artifact, a mass is suspected between the head and body of pancreas which obstructs the common bile duct and the main pancreatic duct as suggested on axial image 16/26 and coronal image 20/3. This measures approximately 4.0 x 2.6 cm, image 36/80. ADRENAL GLANDS: Normal size and morphology bilaterally. No nodule, thickening, or hemorrhage. No periadrenal stranding. KIDNEYS: Bilateral kidney cysts. No follow-up imaging recommended. The largest is in the lower pole of the right kidney measuring 2.8 cm, image 48/25. LYMPH NODES: No upper abdominal adenopathy. VASCULATURE: There appears to be encasement of the superior mesenteric artery. There is also encasement of the celiac artery. 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 Aortic atherosclerotic calcification. PERITONEUM: Trace ascites. A small volume of free fluid extends over the liver. ABDOMINAL WALL: No hernia. No mass. BOWEL: There is gaseous distention of the colon up to the level of the rectum. No  bowel obstruction. BONES: No acute abnormality or worrisome osseous lesion. SOFT TISSUES: Unremarkable. MISCELLANEOUS: Unremarkable. IMPRESSION: 1. Pancreatic mass between the head and body measuring approximately 4.0 x 2.6 cm causing obstruction of the common bile duct and main pancreatic duct; evaluation limited by motion artifact. 2. Vascular involvement with encasement of the superior mesenteric artery and celiac artery. 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 y/o male with metastatic colon cancer to the head and body of the pancreas causing obstructive jaundice with a increase in INR to 1.9.  The patient has had worsening abdominal pain likely from metastatic disease.  The patient is being followed by oncology.  Plan:  The patient will be set up for an ERCP for today with a stent placement.  Due to the patient's elevated INR of the patient will not undergo a sphincterotomy or a permanent stent placement because of the risk of bleeding.  The patient will need the plastic stent removed within 3 months and at that time if the INR is acceptable can be replaced with a covered metal stent that can remain indefinitely.  The patient has been explained the procedure including risk benefits and alternatives.  The patient agrees to proceed with the procedure. . Thank you for involving me in the care of this patient.      LOS: 0 days   Rogelia Copping, MD, MD. NOLIA 03/04/2024, 10:42 AM,  Pager [phone] 7am-5pm  Check AMION for 5pm -7am coverage and on weekends   Note: This dictation was prepared with Dragon dictation along with  smaller phrase technology. Any transcriptional errors that result from this process are unintentional.

## 2024-03-04 NOTE — H&P (Signed)
 History and Physical    Jared Gutierrez FMW:[id number] DOB: 1940-06-28 DOA: 03/03/2024  PCP: Sadie Manna, MD (Confirm with patient/family/NH records and if not entered, this has to be entered at [facility] point of entry) Patient coming from: Home  I have personally briefly reviewed patient's old medical records in [facility]  Chief Complaint: Belly hurts, jaundice HPI: Jared Gutierrez is a 83 y.o. male with medical history significant of recurrent colon cancer unknown stage  on chemotherapy holiday, HTN, BPH, cavitary lung lesion, kidney stone, obesity, presented with abdominal pain and jaundice.  Patient has had chronic abdominal pain was considered to be related to colon cancer however last week the pain has become worse and patient also noticed discoloration of his skin and went to see oncology ordered a outpatient CT scan which showed dilatation of intrahepatic bile duct and a new mass found on the proximal pancreatic body, radiology recommended MRCP.  Denied any nauseous vomiting or diarrhea.  ED Course: Afebrile, [place] bradycardia blood pressure 110/53 O2 sats 100% on room air.  MRCP showed mass in pancreatic head and pancreatic body, blood work showed AST 188 ALT 102 bilirubin 28, BUN 7 creatinine 0.3 K3.5.  Review of Systems: As per HPI otherwise 14 point review of systems negative.    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   Chicken pox    Colon cancer (HCC)    History of kidney stones    Hypertension    Lipoma of colon    Nephrolithiasis    Nephrolithiasis    Obesity    Shingles    Tubular adenoma of colon    multiple fragments    Past Surgical History:  Procedure Laterality Date   COLON SURGERY     COLONOSCOPY N/A 10/02/2014   Procedure: COLONOSCOPY;  Surgeon: Donnice Vaughn Manes, MD;  Location: [facility] ENDOSCOPY;  Service: Endoscopy;  Laterality: N/A;   COLONOSCOPY  WITH PROPOFOL  N/A 02/16/2017   Procedure: COLONOSCOPY WITH PROPOFOL ;  Surgeon: Viktoria Lamar DASEN, MD;  Location: [facility] ENDOSCOPY;  Service: Endoscopy;  Laterality: N/A;   COLONOSCOPY WITH PROPOFOL  N/A 10/05/2020   Procedure: COLONOSCOPY WITH PROPOFOL ;  Surgeon: Maryruth Ole DASEN, MD;  Location: [facility] ENDOSCOPY;  Service: Endoscopy;  Laterality: N/A;   ESOPHAGOGASTRODUODENOSCOPY N/A 12/31/2023   Procedure: EGD (ESOPHAGOGASTRODUODENOSCOPY);  Surgeon: Wilhelmenia Aloha Raddle., MD;  Location: [facility] ENDOSCOPY;  Service: Gastroenterology;  Laterality: N/A;   EUS N/A 04/17/2019   Procedure: FULL UPPER ENDOSCOPIC ULTRASOUND (EUS) RADIAL;  Surgeon: Queenie Asberry LABOR, MD;  Location: [facility] ENDOSCOPY;  Service: Gastroenterology;  Laterality: N/A;   EUS N/A 12/31/2023   Procedure: ULTRASOUND, UPPER GI TRACT, ENDOSCOPIC;  Surgeon: Wilhelmenia Aloha Raddle., MD;  Location: [facility] ENDOSCOPY;  Service: Gastroenterology;  Laterality: N/A;   FINE NEEDLE ASPIRATION  12/31/2023   Procedure: FINE NEEDLE ASPIRATION;  Surgeon: Wilhelmenia Aloha Raddle., MD;  Location: [facility] ENDOSCOPY;  Service: Gastroenterology;;   KIDNEY STONE SURGERY     PARTIAL COLECTOMY  10/17/2013   PORTACATH PLACEMENT Right 06/13/2019   Procedure: INSERTION PORT-A-CATH;  Surgeon: Tye Millet, DO;  Location: [facility] ORS;  Service: General;  Laterality: Right;     reports that he has never smoked. He has never used smokeless tobacco. He reports that he does not currently use alcohol. He reports that he does not use drugs.  No Known Allergies  Family History  Problem Relation  Age of Onset   Cancer Mother    Breast cancer Mother    COPD Father      Prior to Admission medications   Medication Sig Start Date End Date Taking? Authorizing Provider  cyanocobalamin (VITAMIN B12) 500 MCG tablet Take by mouth. 07/26/23 07/25/24 Yes [provider]  lidocaine -prilocaine  (EMLA ) cream Apply small amount to port and cover with saran wrap 1-2 hours prior to port  access 08/27/23  Yes Babara Call, MD  loratadine (CLARITIN) 10 MG tablet Take 10 mg by mouth daily.   Yes [provider]  omeprazole  (PRILOSEC) 20 MG capsule Take 20 mg by mouth daily. 12/25/23  Yes [provider]  ondansetron  (ZOFRAN ) 8 MG tablet Take 1 tablet (8 mg total) by mouth 2 (two) times daily as needed for refractory nausea / vomiting. Start on day 3 after chemotherapy. 11/16/20  Yes Babara Call, MD  pantoprazole  (PROTONIX ) 40 MG tablet Take 1 tablet (40 mg total) by mouth every morning. Take in the morning on an empty stomach 12/07/23 03/04/24 Yes Sreenath, Calvin NOVAK, MD  Probiotic Product (PROBIOTIC DAILY PO) Take 1 tablet by mouth daily.   Yes [provider]  acetaminophen  (TYLENOL ) 500 MG tablet Take 500 mg by mouth every 6 (six) hours as needed. Patient not taking: Reported on 03/04/2024    [provider]  amLODipine  (NORVASC ) 2.5 MG tablet Take 2.5 mg by mouth daily. Patient not taking: Reported on 03/04/2024    [provider]  aspirin  EC 81 MG tablet Take 81 mg by mouth daily. Patient not taking: Reported on 03/04/2024    [provider]  Cholecalciferol (VITAMIN D) 125 MCG (5000 UT) CAPS Take 5,000 mg by mouth.    [provider]  olmesartan (BENICAR) 20 MG tablet Take 1 tablet by mouth daily. Patient not taking: Reported on 03/04/2024 10/14/21   [provider]  prochlorperazine  (COMPAZINE ) 10 MG tablet Take 1 tablet (10 mg total) by mouth every 6 (six) hours as needed (NAUSEA). Patient not taking: Reported on 03/04/2024 11/16/20   Babara Call, MD  sucralfate  (CARAFATE ) 1 g tablet Take 1 tablet (1 g total) by mouth 2 (two) times daily. Patient not taking: Reported on 03/04/2024 12/31/23 12/30/24  Mansouraty, Aloha Raddle., MD    Physical Exam: Vitals:   03/04/24 0600 03/04/24 0800 03/04/24 0835 03/04/24 0919  BP: (!) 105/57 (!) 110/54  (!) 117/59  Pulse: (!) 56 (!) 56  (!) 53  Resp: 14 16  18   Temp: 97.9 F (36.6 C)   98 F (36.7 C) 97.8 F (36.6 C)  TempSrc: Oral  Oral   SpO2: 100% 100%  100%  Weight:      Height:        Constitutional: NAD, calm, comfortable Vitals:   03/04/24 0600 03/04/24 0800 03/04/24 0835 03/04/24 0919  BP: (!) 105/57 (!) 110/54  (!) 117/59  Pulse: (!) 56 (!) 56  (!) 53  Resp: 14 16  18   Temp: 97.9 F (36.6 C)  98 F (36.7 C) 97.8 F (36.6 C)  TempSrc: Oral  Oral   SpO2: 100% 100%  100%  Weight:      Height:       Eyes: PERRL, lids and conjunctivae normal ENMT: Mucous membranes are moist. Posterior pharynx clear of any exudate or lesions.Normal dentition.  Neck: normal, supple, no masses, no thyromegaly Respiratory: clear to auscultation bilaterally, no wheezing, no crackles. Normal respiratory effort. No accessory muscle use.  Cardiovascular: Regular rate and  rhythm, no murmurs / rubs / gallops. No extremity edema. 2+ pedal pulses. No carotid bruits.  Abdomen: no tenderness, no masses palpated. No hepatosplenomegaly. Bowel sounds positive.  Musculoskeletal: no clubbing / cyanosis. No joint deformity upper and lower extremities. Good ROM, no contractures. Normal muscle tone.  Skin: no rashes, lesions, ulcers. No induration Neurologic: CN 2-12 grossly intact. Sensation intact, DTR normal. Strength 5/5 in all 4.  Psychiatric: Normal judgment and insight. Alert and oriented x 3. Normal mood.    Labs on Admission: I have personally reviewed following labs and imaging studies  CBC: Recent Labs  Lab 03/03/24 1807  WBC 4.9  HGB 10.2*  HCT 28.8*  MCV 90.9  PLT 189   Basic Metabolic Panel: Recent Labs  Lab 03/03/24 1807  NA 138  K 3.5  CL 106  CO2 23  GLUCOSE 109*  BUN 7*  CREATININE <0.30*  CALCIUM  9.4   GFR: CrCl cannot be calculated (This lab value cannot be used to calculate CrCl because it is not a number: <0.30). Liver Function Tests: Recent Labs  Lab 03/03/24 1807  AST 188*  ALT 102*  ALKPHOS 342*  BILITOT 28.8*  PROT 6.0*  ALBUMIN 2.4*    Recent Labs  Lab 03/03/24 1807  LIPASE 30   No results for input(s): AMMONIA in the last 168 hours. Coagulation Profile: Recent Labs  Lab 03/03/24 1807 03/04/24 0754  INR 1.6* 1.9*   Cardiac Enzymes: No results for input(s): CKTOTAL, CKMB, CKMBINDEX, TROPONINI in the last 168 hours. BNP (last 3 results) No results for input(s): PROBNP in the last 8760 hours. HbA1C: No results for input(s): HGBA1C in the last 72 hours. CBG: No results for input(s): GLUCAP in the last 168 hours. Lipid Profile: No results for input(s): CHOL, HDL, LDLCALC, TRIG, CHOLHDL, LDLDIRECT in the last 72 hours. Thyroid  Function Tests: No results for input(s): TSH, T4TOTAL, FREET4, T3FREE, THYROIDAB in the last 72 hours. Anemia Panel: No results for input(s): VITAMINB12, FOLATE, FERRITIN, TIBC, IRON, RETICCTPCT in the last 72 hours. Urine analysis:    Component Value Date/Time   COLORURINE AMBER (A) 03/03/2024 2332   APPEARANCEUR HAZY (A) 03/03/2024 2332   APPEARANCEUR Hazy 05/20/2011 2130   LABSPEC 1.015 03/03/2024 2332   LABSPEC 1.021 05/20/2011 2130   PHURINE 5.0 03/03/2024 2332   GLUCOSEU NEGATIVE 03/03/2024 2332   GLUCOSEU Negative 05/20/2011 2130   HGBUR NEGATIVE 03/03/2024 2332   BILIRUBINUR MODERATE (A) 03/03/2024 2332   BILIRUBINUR Negative 05/20/2011 2130   KETONESUR NEGATIVE 03/03/2024 2332   PROTEINUR NEGATIVE 03/03/2024 2332   NITRITE NEGATIVE 03/03/2024 2332   LEUKOCYTESUR NEGATIVE 03/03/2024 2332   LEUKOCYTESUR Negative 05/20/2011 2130    Radiological Exams on Admission: MR ABDOMEN MRCP W WO CONTAST Result Date: 03/04/2024 EXAM: MRCP WITH AND WITHOUT IV CONTRAST 03/04/2024 01:05:40 AM TECHNIQUE: Multisequence, multiplanar magnetic resonance images of the abdomen with and without intravenous contrast. MRCP sequences were performed. 8 mL gadobutrol (GADAVIST) 1 MMOL/ML injection. COMPARISON: CT 02/27/2024 exam detail is diminished  by motion artifact. CLINICAL HISTORY: Jaundice. Patient sent to ED by Dr Babara due to pt having a distended gallbladder and is jaundice. Currently has colon cancer. FINDINGS: LIVER: As noted previously, there is atrophy involving the left lobe of liver. GALLBLADDER AND BILIARY SYSTEM: Moderate gallbladder distention. No significant gallbladder wall thickening. No gallstones identified. Marked intrahepatic bile duct dilatation. The proximal common bile duct measures up to 1.7 cm in diameter, image 20/3. No choledocholithiasis. Abrupt termination of the dilated common bile duct is  noted at the level of the head of the pancreas. SPLEEN: Unremarkable. PANCREAS/PANCREATIC DUCT: There is main duct dilatation of the pancreas from the body through the tail. Obscured by motion artifact, a mass is suspected between the head and body of pancreas which obstructs the common bile duct and the main pancreatic duct as suggested on axial image 16/26 and coronal image 20/3. This measures approximately 4.0 x 2.6 cm, image 36/80. ADRENAL GLANDS: Normal size and morphology bilaterally. No nodule, thickening, or hemorrhage. No periadrenal stranding. KIDNEYS: Bilateral kidney cysts. No follow-up imaging recommended. The largest is in the lower pole of the right kidney measuring 2.8 cm, image 48/25. LYMPH NODES: No upper abdominal adenopathy. VASCULATURE: There appears to be encasement of the superior mesenteric artery. There is also encasement of the celiac artery. 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 Aortic atherosclerotic calcification. PERITONEUM: Trace ascites. A small volume of free fluid extends over the liver. ABDOMINAL WALL: No hernia. No mass. BOWEL: There is gaseous distention of the colon up to the level of the rectum. No bowel obstruction. BONES: No acute abnormality or worrisome osseous lesion. SOFT TISSUES: Unremarkable. MISCELLANEOUS: Unremarkable. IMPRESSION: 1. Pancreatic mass  between the head and body measuring approximately 4.0 x 2.6 cm causing obstruction of the common bile duct and main pancreatic duct; evaluation limited by motion artifact. 2. Vascular involvement with encasement of the superior mesenteric artery and celiac artery. 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 Electronically signed by: Waddell Calk MD 03/04/2024 06:02 AM EDT RP Workstation: [id number]   MR 3D Recon At Scanner Result Date: 03/04/2024 EXAM: MRCP WITH AND WITHOUT IV CONTRAST 03/04/2024 01:05:40 AM TECHNIQUE: Multisequence, multiplanar magnetic resonance images of the abdomen with and without intravenous contrast. MRCP sequences were performed. 8 mL gadobutrol (GADAVIST) 1 MMOL/ML injection. COMPARISON: CT 02/27/2024 exam detail is diminished by motion artifact. CLINICAL HISTORY: Jaundice. Patient sent to ED by Dr Babara due to pt having a distended gallbladder and is jaundice. Currently has colon cancer. FINDINGS: LIVER: As noted previously, there is atrophy involving the left lobe of liver. GALLBLADDER AND BILIARY SYSTEM: Moderate gallbladder distention. No significant gallbladder wall thickening. No gallstones identified. Marked intrahepatic bile duct dilatation. The proximal common bile duct measures up to 1.7 cm in diameter, image 20/3. No choledocholithiasis. Abrupt termination of the dilated common bile duct is noted at the level of the head of the pancreas. SPLEEN: Unremarkable. PANCREAS/PANCREATIC DUCT: There is main duct dilatation of the pancreas from the body through the tail. Obscured by motion artifact, a mass is suspected between the head and body of pancreas which obstructs the common bile duct and the main pancreatic duct as suggested on axial image 16/26 and coronal image 20/3. This measures approximately 4.0 x 2.6 cm, image 36/80. ADRENAL GLANDS: Normal size and morphology bilaterally. No nodule, thickening, or hemorrhage. No periadrenal  stranding. KIDNEYS: Bilateral kidney cysts. No follow-up imaging recommended. The largest is in the lower pole of the right kidney measuring 2.8 cm, image 48/25. LYMPH NODES: No upper abdominal adenopathy. VASCULATURE: There appears to be encasement of the superior mesenteric artery. There is also encasement of the celiac artery. 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 Aortic atherosclerotic calcification. PERITONEUM: Trace ascites. A small volume of free fluid extends over the liver. ABDOMINAL WALL: No hernia. No mass. BOWEL: There is gaseous distention of the colon up to the level of the rectum. No bowel obstruction. BONES: No acute  abnormality or worrisome osseous lesion. SOFT TISSUES: Unremarkable. MISCELLANEOUS: Unremarkable. IMPRESSION: 1. Pancreatic mass between the head and body measuring approximately 4.0 x 2.6 cm causing obstruction of the common bile duct and main pancreatic duct; evaluation limited by motion artifact. 2. Vascular involvement with encasement of the superior mesenteric artery and celiac artery. 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 (For example, if patient is on BP meds at home and you resume or decide to hold them, it is a problem that needs to be her. 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 - Oncology consulted.  Prognosis is poor, patient is made aware, confirmed the patient is DNR/DNI.  Acute hepatic failure Acute coagulopathy Acute hypoalbuminemia - Evidenced by significant decrease of synthetic liver function, etiology likely secondary to acute distal CBD obstruction, bile reflux damage - 1 dose of 5 mg vitamin K - Check ammonium level  Patient seen - Continue amlodipine  and Benicar    DVT prophylaxis: SCD Code Status: DNR/DNI Family Communication: None at bedside Disposition Plan: Patient is sick with no onset of colon cancer metastasis to pancreas causing CBD obstruction, requiring inpatient GI intervention, expect more than 2 midnight hospital stay. Consults called: GI and oncology Admission status: Telemetry admission   Cort ONEIDA Mana MD [facility] Pager [phone]  03/04/2024, 9:24 AM

## 2024-03-04 NOTE — Anesthesia Preprocedure Evaluation (Signed)
 Anesthesia Evaluation  Patient identified by MRN, date of birth, ID band Patient awake    Reviewed: Allergy & Precautions, H&P , NPO status , Patient's Chart, lab work & pertinent test results, reviewed documented beta blocker date and time   Airway Mallampati: II  TM Distance: >3 FB Neck ROM: full    Dental  (+) Teeth Intact   Pulmonary neg pulmonary ROS   Pulmonary exam normal        Cardiovascular Exercise Tolerance: Poor hypertension, On Medications negative cardio ROS Normal cardiovascular exam Rhythm:regular Rate:Normal     Neuro/Psych  Neuromuscular disease  negative psych ROS   GI/Hepatic Neg liver ROS,GERD  Medicated,,  Endo/Other  negative endocrine ROS    Renal/GU CRFRenal disease  negative genitourinary   Musculoskeletal   Abdominal   Peds  Hematology  (+) Blood dyscrasia, anemia   Anesthesia Other Findings Past Medical History: No date: Arthritis     Comment:  OSTEOARTHRITIS No date: Cancer (HCC) No date: Cavitary lesion of lung     Comment:  RIGHT LOWER LOBE No date: Chicken pox No date: Colon cancer (HCC) No date: History of kidney stones No date: Hypertension No date: Lipoma of colon No date: Nephrolithiasis No date: Nephrolithiasis No date: Obesity No date: Shingles No date: Tubular adenoma of colon     Comment:  multiple fragments Past Surgical History: No date: COLON SURGERY 10/02/2014: COLONOSCOPY; N/A     Comment:  Procedure: COLONOSCOPY;  Surgeon: Donnice Vaughn Manes,               MD;  Location: [facility] ENDOSCOPY;  Service: Endoscopy;                Laterality: N/A; 02/16/2017: COLONOSCOPY WITH PROPOFOL ; N/A     Comment:  Procedure: COLONOSCOPY WITH PROPOFOL ;  Surgeon: Viktoria Lamar DASEN, MD;  Location: [facility] ENDOSCOPY;  Service:               Endoscopy;  Laterality: N/A; 10/05/2020: COLONOSCOPY WITH PROPOFOL ; N/A     Comment:  Procedure: COLONOSCOPY WITH PROPOFOL ;   Surgeon:               Maryruth Ole DASEN, MD;  Location: [facility] ENDOSCOPY;                Service: Endoscopy;  Laterality: N/A; 12/31/2023: ESOPHAGOGASTRODUODENOSCOPY; N/A     Comment:  Procedure: EGD (ESOPHAGOGASTRODUODENOSCOPY);  Surgeon:               Wilhelmenia Aloha Raddle., MD;  Location: [facility] ENDOSCOPY;                Service: Gastroenterology;  Laterality: N/A; 04/17/2019: EUS; N/A     Comment:  Procedure: FULL UPPER ENDOSCOPIC ULTRASOUND (EUS)               RADIAL;  Surgeon: Queenie Asberry LABOR, MD;  Location:               [facility] ENDOSCOPY;  Service: Gastroenterology;  Laterality:               N/A; 12/31/2023: EUS; N/A     Comment:  Procedure: ULTRASOUND, UPPER GI TRACT, ENDOSCOPIC;                Surgeon: Wilhelmenia, Aloha Raddle., MD;  Location: [facility]               ENDOSCOPY;  Service: Gastroenterology;  Laterality:  N/A; 12/31/2023: FINE NEEDLE ASPIRATION     Comment:  Procedure: FINE NEEDLE ASPIRATION;  Surgeon: Wilhelmenia              Aloha Raddle., MD;  Location: [facility] ENDOSCOPY;  Service:               Gastroenterology;; No date: KIDNEY STONE SURGERY 10/17/2013: PARTIAL COLECTOMY 06/13/2019: PORTACATH PLACEMENT; Right     Comment:  Procedure: INSERTION PORT-A-CATH;  Surgeon: Tye Millet, DO;  Location: [facility] ORS;  Service: General;                Laterality: Right; BMI    Body Mass Index: 28.43 kg/m     Reproductive/Obstetrics negative OB ROS                              Anesthesia Physical Anesthesia Plan  ASA: 3  Anesthesia Plan: General ETT   Post-op Pain Management:    Induction:   PONV Risk Score and Plan: 3  Airway Management Planned:   Additional Equipment:   Intra-op Plan:   Post-operative Plan:   Informed Consent: I have reviewed the patients History and Physical, chart, labs and discussed the procedure including the risks, benefits and alternatives for the proposed anesthesia with the patient or authorized  representative who has indicated his/her understanding and acceptance.     Dental Advisory Given  Plan Discussed with: CRNA  Anesthesia Plan Comments:         Anesthesia Quick Evaluation

## 2024-03-04 NOTE — Anesthesia Procedure Notes (Signed)
 Procedure Name: Intubation Date/Time: 03/04/2024 12:37 PM  Performed by: Jestine Palma, CRNAPre-anesthesia Checklist: Patient identified, Emergency Drugs available, Suction available and Patient being monitored Patient Re-evaluated:Patient Re-evaluated prior to induction Oxygen Delivery Method: Circle system utilized Preoxygenation: Pre-oxygenation with 100% oxygen Induction Type: IV induction Ventilation: Mask ventilation without difficulty Laryngoscope Size: McGrath and 3 Grade View: Grade I Tube type: Oral Tube size: 7.0 mm Number of attempts: 1 Airway Equipment and Method: Stylet and Oral airway Placement Confirmation: ETT inserted through vocal cords under direct vision, positive ETCO2 and breath sounds checked- equal and bilateral Secured at: 22 cm Tube secured with: Tape Dental Injury: Teeth and Oropharynx as per pre-operative assessment

## 2024-03-04 NOTE — Progress Notes (Signed)
 Mobility Specialist Progress Note:    03/04/24 0944  Mobility  Activity Ambulated with assistance  Level of Assistance Standby assist, set-up cues, supervision of patient - no hands on  Assistive Device Front wheel walker  Distance Ambulated (ft) 30 ft  Range of Motion/Exercises Active;All extremities  Activity Response Tolerated well  Mobility visit 1 Mobility  Mobility Specialist Start Time (ACUTE ONLY) [id number]  Mobility Specialist Stop Time (ACUTE ONLY) 0950  Mobility Specialist Time Calculation (min) (ACUTE ONLY) 12 min   Pt received requesting assistance to bathroom, family at bedside. Required CGA/SBA to stand and ambulate with RW. Pt uses straight point cane at home. Tolerated well, asx throughout. Returned supine, alarm on. All needs met.  Sherrilee Ditty Mobility Specialist Please contact via Special educational needs teacher or  Rehab office at [phone]

## 2024-03-04 NOTE — Transfer of Care (Signed)
 Immediate Anesthesia Transfer of Care Note  Patient: Jared Gutierrez  Procedure(s) Performed: ERCP, WITH INTERVENTION IF INDICATED  Patient Location: PACU  Anesthesia Type:General  Level of Consciousness: drowsy  Airway & Oxygen Therapy: Patient Spontanous Breathing and Patient connected to face mask oxygen  Post-op Assessment: Report given to RN and Post -op Vital signs reviewed and stable  Post vital signs: stable  Last Vitals:  Vitals Value Taken Time  BP 126/76 03/04/24 13:47  Temp    Pulse 72 03/04/24 13:48  Resp 14 03/04/24 13:51  SpO2 100 % 03/04/24 13:48  Vitals shown include unfiled device data.  Last Pain:  Vitals:   03/04/24 1030  TempSrc:   PainSc: 0-No pain         Complications: No notable events documented.

## 2024-03-04 NOTE — Anesthesia Postprocedure Evaluation (Signed)
 Anesthesia Post Note  Patient: Jared Gutierrez  Procedure(s) Performed: ERCP, WITH INTERVENTION IF INDICATED INSERTION, STENT, BILE DUCT  Patient location during evaluation: PACU Anesthesia Type: General Level of consciousness: awake and alert Pain management: pain level controlled Vital Signs Assessment: post-procedure vital signs reviewed and stable Respiratory status: spontaneous breathing, nonlabored ventilation, respiratory function stable and patient connected to nasal cannula oxygen Cardiovascular status: blood pressure returned to baseline and stable Postop Assessment: no apparent nausea or vomiting Anesthetic complications: no   No notable events documented.   Last Vitals:  Vitals:   03/04/24 1358 03/04/24 1408  BP: 112/72 115/66  Pulse:    Resp: 10 14  Temp:    SpO2: 100% 100%    Last Pain:  Vitals:   03/04/24 1408  TempSrc:   PainSc: 0-No pain                 Lynwood KANDICE Clause

## 2024-03-05 DIAGNOSIS — R109 Unspecified abdominal pain: Secondary | ICD-10-CM | POA: Diagnosis not present

## 2024-03-05 DIAGNOSIS — K8689 Other specified diseases of pancreas: Secondary | ICD-10-CM | POA: Diagnosis not present

## 2024-03-05 DIAGNOSIS — R7989 Other specified abnormal findings of blood chemistry: Secondary | ICD-10-CM | POA: Diagnosis not present

## 2024-03-05 LAB — HEPATIC FUNCTION PANEL
ALT: 76 U/L — ABNORMAL HIGH (ref 0–44)
AST: 160 U/L — ABNORMAL HIGH (ref 15–41)
Albumin: 1.8 g/dL — ABNORMAL LOW (ref 3.5–5.0)
Alkaline Phosphatase: 253 U/L — ABNORMAL HIGH (ref 38–126)
Bilirubin, Direct: 11.8 mg/dL — ABNORMAL HIGH (ref 0.0–0.2)
Total Bilirubin: 19.4 mg/dL (ref 0.0–1.2)
Total Protein: 4.6 g/dL — ABNORMAL LOW (ref 6.5–8.1)

## 2024-03-05 LAB — CBC
HCT: 22.3 % — ABNORMAL LOW (ref 39.0–52.0)
Hemoglobin: 7.8 g/dL — ABNORMAL LOW (ref 13.0–17.0)
MCH: 31.5 pg (ref 26.0–34.0)
MCHC: 35 g/dL (ref 30.0–36.0)
MCV: 89.9 fL (ref 80.0–100.0)
Platelets: 127 K/uL — ABNORMAL LOW (ref 150–400)
RBC: 2.48 MIL/uL — ABNORMAL LOW (ref 4.22–5.81)
RDW: 20.1 % — ABNORMAL HIGH (ref 11.5–15.5)
WBC: 4.5 K/uL (ref 4.0–10.5)
nRBC: 0 % (ref 0.0–0.2)

## 2024-03-05 LAB — PROTIME-INR
INR: 1.5 — ABNORMAL HIGH (ref 0.8–1.2)
Prothrombin Time: 19.2 s — ABNORMAL HIGH (ref 11.4–15.2)

## 2024-03-05 MED ORDER — ENSURE PLUS HIGH PROTEIN PO LIQD
237.0000 mL | Freq: Three times a day (TID) | ORAL | Status: DC
  Administered 2024-03-06: 237 mL via ORAL

## 2024-03-05 MED ORDER — ADULT MULTIVITAMIN W/MINERALS CH
1.0000 | ORAL_TABLET | Freq: Every day | ORAL | Status: DC
  Administered 2024-03-06: 1 via ORAL
  Filled 2024-03-05: qty 1

## 2024-03-05 NOTE — Progress Notes (Signed)
 Progress Note   Patient: Jared Gutierrez FMW:[id number] DOB: Nov 16, 1940 DOA: 03/03/2024     1 DOS: the patient was seen and examined on 03/05/2024   Brief hospital course: Jared Gutierrez is a 83 y.o. male with medical history significant of recurrent colon cancer unknown stage  on chemotherapy holiday, HTN, BPH, cavitary lung lesion, kidney stone, obesity, presented with abdominal pain and jaundice.   Patient has had chronic abdominal pain was considered to be related to colon cancer however last week the pain has become worse and patient also noticed discoloration of his skin and went to see oncology ordered a outpatient CT scan which showed dilatation of intrahepatic bile duct and a new mass found on the proximal pancreatic body, radiology recommended MRCP.  Denied any nauseous vomiting or diarrhea.   ED Course: Afebrile, [place] bradycardia blood pressure 110/53 O2 sats 100% on room air.  MRCP showed mass in pancreatic head and pancreatic body, blood work showed AST 188 ALT 102 bilirubin 28, BUN 7 creatinine 0.3 K3.5.     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 (For example, if patient is on BP meds at home and you resume or decide to hold them, it is a problem that needs to be her. Same for CAD, COPD, HLD and so on)   Acute transaminitis Acute hyperbilirubinemia Colon cancer with metastasis to pancreatic head and body - Secondary to distal CBD obstruction from metastatic colon cancer to pancreas - Appreciate GI input.  Patient is status post ERCP with stent placement -As per recommendation from GI, will continue prophylactic antibiotics treatment with IV Zosyn - Symptomatic management, cholestyramine for bilirubin binding, as needed Benadryl  for itchiness. - Oncology consulted.  Prognosis is poor, patient is  made aware, confirmed the patient is DNR/DNI. - Appreciate oncology input, patient to follow-up with oncology as an outpatient.  Was previously on chemotherapy break. May resume chemotherapy outpatient if he is able to recover and susceptible condition for further treatments.  - Will consult palliative care     Acute hepatic failure Acute coagulopathy Hypoalbuminemia - Evidenced by significant decrease of synthetic liver function, etiology likely metastatic disease to the liver, acute distal CBD obstruction, bile reflux damage - Patient received a one-time dose of 5 mg vitamin K    Anemia of chronic disease Patient noted to have a drop in his H&H from 10.2 >> 7.8 No evidence of bleeding Monitor closely       Subjective: No new complaints.  Family at the bedside  Physical Exam: Vitals:   03/04/24 1408 03/04/24 2028 03/05/24 0418 03/05/24 0815  BP: 115/66 114/71 106/67 107/63  Pulse:  61 60 (!) 57  Resp: 14 16 16 17   Temp:  97.6 F (36.4 C) (!) 97.5 F (36.4 C) 97.9 F (36.6 C)  TempSrc:  Oral Oral Oral  SpO2: 100% 100% 100% 98%  Weight:      Height:       Eyes: PERRL, lids and conjunctivae normal, scleral icterus ENMT: Mucous membranes are moist. Posterior pharynx clear of any exudate or lesions.Normal dentition.  Neck: normal, supple, no masses, no thyromegaly Respiratory: clear to auscultation bilaterally, no wheezing, no crackles. Normal respiratory effort. No accessory muscle use.  Cardiovascular: Regular rate and rhythm, no murmurs / rubs / gallops. No extremity edema. 2+ pedal pulses. No  carotid bruits.  Abdomen: no tenderness, no masses palpated. No hepatosplenomegaly. Bowel sounds positive.  Musculoskeletal: no clubbing / cyanosis. No joint deformity upper and lower extremities. Good ROM, no contractures. Normal muscle tone.  Skin: no rashes, lesions, ulcers. No induration Neurologic: CN 2-12 grossly intact. Sensation intact, DTR normal. Strength 5/5 in all 4.   Psychiatric: Normal judgment and insight. Alert and oriented x 3. Normal mood.    Data Reviewed: Total bilirubin 19.4 down from 28.8, alkaline phosphatase 253, AST 188 >>160, ALT 342 >> 253, INR 1.5, hemoglobin 7.8 Labs reviewed  Family Communication: Plan of care discussed with patient and family at the bedside.  All questions and concerns have been addressed.  They verbalized understanding and agree with the plan.  Disposition: Status is: Inpatient Remains inpatient appropriate because: On IV antibiotics  Planned Discharge Destination: TBD    Time spent: 50 minutes  Author: Aimee Somerset, MD 03/05/2024 12:45 PM  For on call review [email or web address].

## 2024-03-05 NOTE — Progress Notes (Signed)
 Initial Nutrition Assessment  DOCUMENTATION CODES:   Not applicable  INTERVENTION:   Ensure Plus High Protein po TID, each supplement provides 350 kcal and 20 grams of protein  MVI po daily   Pt at high refeed risk; recommend monitor potassium, magnesium  and phosphorus labs daily until stable  Daily weights   NUTRITION DIAGNOSIS:   Increased nutrient needs related to cancer and cancer related treatments as evidenced by estimated needs.  GOAL:   Patient will meet greater than or equal to 90% of their needs  MONITOR:   PO intake, Supplement acceptance, Labs, Weight trends, Skin, I & O's  REASON FOR ASSESSMENT:   Malnutrition Screening Tool    ASSESSMENT:   83 y/o male with h/o HTN, kidney stones, colon mass c/p hemicolectomy (2015) with recurrence with pancreatic metastases s/p chemoradiation (2021), hepatic steatosis, B12 deficiency and BPH who is admitted with biliary obstruction secondary to metastatic disease s/p ERCP with stent placement 10/21.  Pt with increased estimated needs secondary to metastatic cancer. Pt is followed by the RD at the [facility] Pt with decreased appetite and oral intake pta. Pt has been drinking Boost at home. Pt is documented to be eating 100% of meals in hospital. RD will add supplements and MVI to help pt meet his estimated needs. Pt is at refeed risk. Per chart, pt is down ~21lbs(11%) over the past 6 months; this is significant weight loss. RD will obtain history and exam at follow up.   Medications reviewed and include: questran, protonix , NaCl @75ml /hr  Labs reviewed: alk phos 253(H), AST 160(H), ALT 76(H), tbili- 19.4(H) Ammonia 39(H)- 10/21 Hgb 7.8(L), Hct 2.3(L)  NUTRITION - FOCUSED PHYSICAL EXAM: Unable to perform at this time  Diet Order:   Diet Order             Diet full liquid Room service appropriate? Yes; Fluid consistency: Thin  Diet effective now                  EDUCATION NEEDS:   No education needs have  been identified at this time  Skin:  Skin Assessment: Reviewed RN Assessment (ecchymosis, jaundice)  Last BM:  10/22- type 7  Height:   Ht Readings from Last 1 Encounters:  03/03/24 5' 8 (1.727 m)    Weight:   Wt Readings from Last 1 Encounters:  03/03/24 84.8 kg    Ideal Body Weight:  70 kg  BMI:  Body mass index is 28.43 kg/m.  Estimated Nutritional Needs:   Kcal:  2000-2300kcal/day  Protein:  100-115g/daty  Fluid:  1.8-2.1L/day  Augustin Shams MS, RD, LDN If unable to be reached, please send secure chat to RD inpatient available from 8:00a-4:00p daily

## 2024-03-05 NOTE — TOC CM/SW Note (Signed)
 Transition of Care [facility]) - Inpatient Brief Assessment   Patient Details  Name: BHAVESH VAZQUEZ MRN: [id number] Date of Birth: 22-May-1940  Transition of Care [facility]) CM/SW Contact:    Corean ONEIDA Haddock, RN Phone Number: 03/05/2024, 9:46 AM   Clinical Narrative:   Transition of Care [facility]) Screening Note   Patient Details  Name: JOANDY BURGET Date of Birth: 07/25/1940   Transition of Care [facility]) CM/SW Contact:    Corean ONEIDA Haddock, RN Phone Number: 03/05/2024, 9:46 AM    Transition of Care Department [facility]) has reviewed patient and no TOC needs have been identified at this time.  If new patient transition needs arise, please place a TOC consult.    Transition of Care Asessment: Insurance and Status: Insurance coverage has been reviewed Patient has primary care physician: Yes     Prior/Current Home Services: No current home services Social Drivers of Health Review: SDOH reviewed no interventions necessary Readmission risk has been reviewed: Yes Transition of care needs: no transition of care needs at this time

## 2024-03-05 NOTE — Progress Notes (Signed)
 Jared Copping, MD [facility]   [address] [place], [place] [address] Phone: [phone] Fax : [phone]   Subjective: The patient is status post ERCP yesterday with a pancreatic mass consistent with metastatic colon cancer.  The patient's stent was placed yesterday and no sphincterotomy or cutting was done.  The patient did drop his hemoglobin today but is not showing any sign of any GI bleeding.  The patient's liver enzymes have decreased and his INR is improving.   Objective: Vital signs in last 24 hours: Vitals:   03/04/24 1408 03/04/24 2028 03/05/24 0418 03/05/24 0815  BP: 115/66 114/71 106/67 107/63  Pulse:  61 60 (!) 57  Resp: 14 16 16 17   Temp:  97.6 F (36.4 C) (!) 97.5 F (36.4 C) 97.9 F (36.6 C)  TempSrc:  Oral Oral Oral  SpO2: 100% 100% 100% 98%  Weight:      Height:       Weight change:   Intake/Output Summary (Last 24 hours) at 03/05/2024 1201 Last data filed at 03/04/2024 1825 Gross per 24 hour  Intake 500 ml  Output 350 ml  Net 150 ml     Exam: General: Patient in bed in no apparent distress doing well.   Lab Results: @LABTEST2 @ Micro Results: No results found for this or any previous visit (from the past 240 hours). Studies/Results: DG C-Arm 1-60 Min-No Report Result Date: 03/04/2024 Fluoroscopy was utilized by the requesting physician.  No radiographic interpretation.   MR ABDOMEN MRCP W WO CONTAST Result Date: 03/04/2024 EXAM: MRCP WITH AND WITHOUT IV CONTRAST 03/04/2024 01:05:40 AM TECHNIQUE: Multisequence, multiplanar magnetic resonance images of the abdomen with and without intravenous contrast. MRCP sequences were performed. 8 mL gadobutrol (GADAVIST) 1 MMOL/ML injection. COMPARISON: CT 02/27/2024 exam detail is diminished by motion artifact. CLINICAL HISTORY: Jaundice. Patient sent to ED by Dr Babara due to pt having a distended gallbladder and is jaundice. Currently has colon cancer. FINDINGS: LIVER: As noted previously, there is  atrophy involving the left lobe of liver. GALLBLADDER AND BILIARY SYSTEM: Moderate gallbladder distention. No significant gallbladder wall thickening. No gallstones identified. Marked intrahepatic bile duct dilatation. The proximal common bile duct measures up to 1.7 cm in diameter, image 20/3. No choledocholithiasis. Abrupt termination of the dilated common bile duct is noted at the level of the head of the pancreas. SPLEEN: Unremarkable. PANCREAS/PANCREATIC DUCT: There is main duct dilatation of the pancreas from the body through the tail. Obscured by motion artifact, a mass is suspected between the head and body of pancreas which obstructs the common bile duct and the main pancreatic duct as suggested on axial image 16/26 and coronal image 20/3. This measures approximately 4.0 x 2.6 cm, image 36/80. ADRENAL GLANDS: Normal size and morphology bilaterally. No nodule, thickening, or hemorrhage. No periadrenal stranding. KIDNEYS: Bilateral kidney cysts. No follow-up imaging recommended. The largest is in the lower pole of the right kidney measuring 2.8 cm, image 48/25. LYMPH NODES: No upper abdominal adenopathy. VASCULATURE: There appears to be encasement of the superior mesenteric artery. There is also encasement of the celiac artery. 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 Aortic atherosclerotic calcification. PERITONEUM: Trace ascites. A small volume of free fluid extends over the liver. ABDOMINAL WALL: No hernia. No mass. BOWEL: There is gaseous distention of the colon up to the level of the rectum. No bowel obstruction. BONES: No acute abnormality or worrisome osseous lesion. SOFT TISSUES: Unremarkable. MISCELLANEOUS: Unremarkable. IMPRESSION: 1. Pancreatic mass  between the head and body measuring approximately 4.0 x 2.6 cm causing obstruction of the common bile duct and main pancreatic duct; evaluation limited by motion artifact. 2. Vascular involvement with  encasement of the superior mesenteric artery and celiac artery. 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 Electronically signed by: Waddell Calk MD 03/04/2024 06:02 AM EDT RP Workstation: [id number]   MR 3D Recon At Scanner Result Date: 03/04/2024 EXAM: MRCP WITH AND WITHOUT IV CONTRAST 03/04/2024 01:05:40 AM TECHNIQUE: Multisequence, multiplanar magnetic resonance images of the abdomen with and without intravenous contrast. MRCP sequences were performed. 8 mL gadobutrol (GADAVIST) 1 MMOL/ML injection. COMPARISON: CT 02/27/2024 exam detail is diminished by motion artifact. CLINICAL HISTORY: Jaundice. Patient sent to ED by Dr Babara due to pt having a distended gallbladder and is jaundice. Currently has colon cancer. FINDINGS: LIVER: As noted previously, there is atrophy involving the left lobe of liver. GALLBLADDER AND BILIARY SYSTEM: Moderate gallbladder distention. No significant gallbladder wall thickening. No gallstones identified. Marked intrahepatic bile duct dilatation. The proximal common bile duct measures up to 1.7 cm in diameter, image 20/3. No choledocholithiasis. Abrupt termination of the dilated common bile duct is noted at the level of the head of the pancreas. SPLEEN: Unremarkable. PANCREAS/PANCREATIC DUCT: There is main duct dilatation of the pancreas from the body through the tail. Obscured by motion artifact, a mass is suspected between the head and body of pancreas which obstructs the common bile duct and the main pancreatic duct as suggested on axial image 16/26 and coronal image 20/3. This measures approximately 4.0 x 2.6 cm, image 36/80. ADRENAL GLANDS: Normal size and morphology bilaterally. No nodule, thickening, or hemorrhage. No periadrenal stranding. KIDNEYS: Bilateral kidney cysts. No follow-up imaging recommended. The largest is in the lower pole of the right kidney measuring 2.8 cm, image 48/25. LYMPH NODES: No upper abdominal adenopathy.  VASCULATURE: There appears to be encasement of the superior mesenteric artery. There is also encasement of the celiac artery. 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 Aortic atherosclerotic calcification. PERITONEUM: Trace ascites. A small volume of free fluid extends over the liver. ABDOMINAL WALL: No hernia. No mass. BOWEL: There is gaseous distention of the colon up to the level of the rectum. No bowel obstruction. BONES: No acute abnormality or worrisome osseous lesion. SOFT TISSUES: Unremarkable. MISCELLANEOUS: Unremarkable. IMPRESSION: 1. Pancreatic mass between the head and body measuring approximately 4.0 x 2.6 cm causing obstruction of the common bile duct and main pancreatic duct; evaluation limited by motion artifact. 2. Vascular involvement with encasement of the superior mesenteric artery and celiac artery. 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 Electronically signed by: Waddell Calk MD 03/04/2024 06:02 AM EDT RP Workstation: [id number]   Medications: I have reviewed the patient's current medications. Scheduled Meds:  Chlorhexidine  Gluconate Cloth  6 each Topical Daily   cholestyramine  4 g Oral TID   feeding supplement  1 Container Oral TID BM   Influenza vac split trivalent PF  0.5 mL Intramuscular Tomorrow-1000   loratadine  10 mg Oral Daily   pantoprazole   40 mg Oral Daily   pneumococcal 20-valent conjugate vaccine  0.5 mL Intramuscular Tomorrow-1000   sodium chloride  flush  10-40 mL Intracatheter Q12H   Continuous Infusions:  sodium chloride  75 mL/hr at 03/05/24 1128   piperacillin-tazobactam (ZOSYN)  IV 3.375 g (03/05/24 0524)   PRN Meds:.acetaminophen  **OR** acetaminophen , diphenhydrAMINE , HYDROmorphone (DILAUDID) injection, ondansetron  **OR** ondansetron  (ZOFRAN ) IV, sodium chloride   flush   Assessment: Principal Problem:   Abdominal pain Active Problems:   Malignant neoplasm of  transverse colon (HCC)   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  The patient has been told that he needs to have a repeat colonoscopy in 3 months for removal of the stent and a sphincterotomy with metal stent insertion at that time.  The patient has been explained the plan and agrees with it.  Nothing further to do from a GI point of view at this time.  I will sign off.  Please call if any further GI concerns or questions.  We would like to thank you for the opportunity to participate in the care of Jared Gutierrez.    LOS: 1 day   Jared Copping, MD.FACG 03/05/2024, 12:01 PM Pager [phone] 7am-5pm  Check AMION for 5pm -7am coverage and on weekends

## 2024-03-05 NOTE — Plan of Care (Signed)
  Problem: Education: Goal: Knowledge of General Education information will improve Description: Including pain rating scale, medication(s)/side effects and non-pharmacologic comfort measures Outcome: Progressing   Problem: Health Behavior/Discharge Planning: Goal: Ability to manage health-related needs will improve Outcome: Progressing   Problem: Clinical Measurements: Goal: Ability to maintain clinical measurements within normal limits will improve Outcome: Progressing Goal: Will remain free from infection Outcome: Progressing Goal: Diagnostic test results will improve Outcome: Progressing Goal: Respiratory complications will improve Outcome: Progressing Goal: Cardiovascular complication will be avoided Outcome: Progressing   Problem: Elimination: Goal: Will not experience complications related to bowel motility Outcome: Progressing Goal: Will not experience complications related to urinary retention Outcome: Progressing   Problem: Coping: Goal: Level of anxiety will decrease Outcome: Progressing   Problem: Safety: Goal: Ability to remain free from injury will improve Outcome: Progressing   Problem: Pain Managment: Goal: General experience of comfort will improve and/or be controlled Outcome: Progressing   Problem: Skin Integrity: Goal: Risk for impaired skin integrity will decrease Outcome: Progressing

## 2024-03-05 NOTE — Evaluation (Signed)
 Physical Therapy Evaluation Patient Details Name: Jared Gutierrez MRN: [id number] DOB: 07-02-1940 Today's Date: 03/05/2024  History of Present Illness  Jared Gutierrez is a 83 y.o. male with medical history significant of recurrent colon cancer unknown stage  on chemotherapy holiday, HTN, BPH, cavitary lung lesion, kidney stone, obesity, presented with abdominal pain and jaundice.  CT scan which showed dilatation of intrahepatic bile duct and a new mass found on the proximal pancreatic body, radiology recommended MRCP.   Clinical Impression  Patient received in bed with family at bedside. Patient reports he is pretty weak but agrees to PT assessment. Patient is mod I with bed mobility and transfers. He ambulated 150 feet with RW and supervision. No overt LOB. Patient does pretty well despite reports of fatigue. Patient will continue to benefit from skilled PT to improve endurance and strength.         If plan is discharge home, recommend the following: A little help with walking and/or transfers;A little help with bathing/dressing/bathroom   Can travel by private vehicle    yes    Equipment Recommendations None recommended by PT  Recommendations for Other Services       Functional Status Assessment Patient has had a recent decline in their functional status and demonstrates the ability to make significant improvements in function in a reasonable and predictable amount of time.     Precautions / Restrictions Precautions Precaution/Restrictions Comments: mod fall Restrictions Weight Bearing Restrictions Per Provider Order: No      Mobility  Bed Mobility Overal bed mobility: Modified Independent                  Transfers Overall transfer level: Modified independent Equipment used: Rolling walker (2 wheels)                    Ambulation/Gait Ambulation/Gait assistance: Supervision Gait Distance (Feet): 150 Feet Assistive device: Rolling walker (2  wheels) Gait Pattern/deviations: Step-through pattern Gait velocity: decr     General Gait Details: patient generally safe with mobility. Limited by weakness.  Stairs            Wheelchair Mobility     Tilt Bed    Modified Rankin (Stroke Patients Only)       Balance Overall balance assessment: Modified Independent                                           Pertinent Vitals/Pain Pain Assessment Pain Assessment: Faces Faces Pain Scale: Hurts a little bit Pain Location: abdominal Pain Descriptors / Indicators: Discomfort Pain Intervention(s): Monitored during session, Repositioned    Home Living Family/patient expects to be discharged to:: Private residence Living Arrangements: Spouse/significant other;Children Available Help at Discharge: Family;Available 24 hours/day Type of Home: House Home Access: Level entry       Home Layout: One level Home Equipment: Agricultural consultant (2 wheels);Cane - single point;Shower seat;Toilet riser;Grab bars - tub/shower      Prior Function Prior Level of Function : Needs assist;Driving;History of Falls (last six months)             Mobility Comments: uses SPC for home mobility; RW for community ambulation; drives occasionally to chemo appointments when port is removed, family drives to all other appts. ADLs Comments: IND w/ toileting and dressing; wife helps w/ bathing     Extremity/Trunk Assessment   Upper Extremity  Assessment Upper Extremity Assessment: Generalized weakness    Lower Extremity Assessment Lower Extremity Assessment: Generalized weakness    Cervical / Trunk Assessment Cervical / Trunk Assessment: Normal  Communication   Communication Communication: Impaired Factors Affecting Communication: Hearing impaired    Cognition Arousal: Alert Behavior During Therapy: WFL for tasks assessed/performed   PT - Cognitive impairments: No apparent impairments                          Following commands: Intact       Cueing Cueing Techniques: Verbal cues     General Comments      Exercises     Assessment/Plan    PT Assessment Patient needs continued PT services  PT Problem List Decreased strength;Decreased activity tolerance;Decreased mobility;Pain       PT Treatment Interventions DME instruction;Gait training;Stair training;Functional mobility training;Therapeutic activities;Therapeutic exercise;Patient/family education    PT Goals (Current goals can be found in the Care Plan section)  Acute Rehab PT Goals Patient Stated Goal: return home PT Goal Formulation: With patient/family Time For Goal Achievement: 03/14/24 Potential to Achieve Goals: Good    Frequency Min 2X/week     Co-evaluation               AM-PAC PT 6 Clicks Mobility  Outcome Measure Help needed turning from your back to your side while in a flat bed without using bedrails?: A Little Help needed moving from lying on your back to sitting on the side of a flat bed without using bedrails?: A Little Help needed moving to and from a bed to a chair (including a wheelchair)?: A Little Help needed standing up from a chair using your arms (e.g., wheelchair or bedside chair)?: A Little Help needed to walk in hospital room?: A Little Help needed climbing 3-5 steps with a railing? : A Little 6 Click Score: 18    End of Session   Activity Tolerance: Patient tolerated treatment well Patient left: in bed;with call bell/phone within reach;with family/visitor present Nurse Communication: Mobility status PT Visit Diagnosis: Muscle weakness (generalized) (M62.81);Difficulty in walking, not elsewhere classified (R26.2)    Time: 8584-8563 PT Time Calculation (min) (ACUTE ONLY): 21 min   Charges:   PT Evaluation $PT Eval Low Complexity: 1 Low PT Treatments $Gait Training: 8-22 mins PT General Charges $$ ACUTE PT VISIT: 1 Visit        Kveon Casanas, PT, GCS 03/05/24,2:45  PM

## 2024-03-06 ENCOUNTER — Other Ambulatory Visit: Payer: Self-pay

## 2024-03-06 ENCOUNTER — Encounter: Payer: Self-pay | Admitting: Hematology and Oncology

## 2024-03-06 ENCOUNTER — Encounter: Payer: Self-pay | Admitting: Oncology

## 2024-03-06 ENCOUNTER — Telehealth: Payer: Self-pay

## 2024-03-06 DIAGNOSIS — K831 Obstruction of bile duct: Secondary | ICD-10-CM | POA: Diagnosis not present

## 2024-03-06 DIAGNOSIS — C184 Malignant neoplasm of transverse colon: Secondary | ICD-10-CM

## 2024-03-06 DIAGNOSIS — R197 Diarrhea, unspecified: Secondary | ICD-10-CM

## 2024-03-06 LAB — MAGNESIUM: Magnesium: 1.5 mg/dL — ABNORMAL LOW (ref 1.7–2.4)

## 2024-03-06 LAB — HEPATIC FUNCTION PANEL
ALT: 76 U/L — ABNORMAL HIGH (ref 0–44)
AST: 116 U/L — ABNORMAL HIGH (ref 15–41)
Albumin: 2.1 g/dL — ABNORMAL LOW (ref 3.5–5.0)
Alkaline Phosphatase: 274 U/L — ABNORMAL HIGH (ref 38–126)
Bilirubin, Direct: 8 mg/dL — ABNORMAL HIGH (ref 0.0–0.2)
Indirect Bilirubin: 6.9 mg/dL — ABNORMAL HIGH (ref 0.3–0.9)
Total Bilirubin: 14.9 mg/dL — ABNORMAL HIGH (ref 0.0–1.2)
Total Protein: 5.8 g/dL — ABNORMAL LOW (ref 6.5–8.1)

## 2024-03-06 LAB — BASIC METABOLIC PANEL WITH GFR
Anion gap: 6 (ref 5–15)
BUN: 5 mg/dL — ABNORMAL LOW (ref 8–23)
CO2: 20 mmol/L — ABNORMAL LOW (ref 22–32)
Calcium: 8.1 mg/dL — ABNORMAL LOW (ref 8.9–10.3)
Chloride: 110 mmol/L (ref 98–111)
Creatinine, Ser: 0.31 mg/dL — ABNORMAL LOW (ref 0.61–1.24)
GFR, Estimated: >60 mL/min (ref >60)
Glucose, Bld: 97 mg/dL (ref 70–99)
Potassium: 3.5 mmol/L (ref 3.5–5.1)
Sodium: 136 mmol/L (ref 135–145)

## 2024-03-06 LAB — PHOSPHORUS: Phosphorus: 2.5 mg/dL (ref 2.5–4.6)

## 2024-03-06 MED ORDER — ENSURE PLUS HIGH PROTEIN PO LIQD
237.0000 mL | Freq: Two times a day (BID) | ORAL | 0 refills | Status: DC
  Filled 2024-03-06: qty 14220, 30d supply, fill #0

## 2024-03-06 MED ORDER — HEPARIN NA (PORK) LOCK FLSH PF 10 UNIT/ML IV SOLN
10.0000 [IU] | Freq: Once | INTRAVENOUS | Status: DC
  Filled 2024-03-06: qty 5

## 2024-03-06 MED ORDER — MAGNESIUM SULFATE 2 GM/50ML IV SOLN
2.0000 g | Freq: Once | INTRAVENOUS | Status: AC
Start: 2024-03-06 — End: 2024-03-06
  Administered 2024-03-06: 2 g via INTRAVENOUS
  Filled 2024-03-06: qty 50

## 2024-03-06 MED ORDER — CHOLESTYRAMINE 4 G PO PACK
4.0000 g | PACK | Freq: Three times a day (TID) | ORAL | 0 refills | Status: DC
  Filled 2024-03-06: qty 42, 14d supply, fill #0

## 2024-03-06 NOTE — Progress Notes (Signed)
 Mobility Specialist - Progress Note   03/06/24 1209  Mobility  Activity Ambulated with assistance  Level of Assistance Standby assist, set-up cues, supervision of patient - no hands on  Assistive Device Front wheel walker  Distance Ambulated (ft) 24 ft  Activity Response Tolerated well  Mobility visit 1 Mobility  Mobility Specialist Start Time (ACUTE ONLY) 1149  Mobility Specialist Stop Time (ACUTE ONLY) 1200  Mobility Specialist Time Calculation (min) (ACUTE ONLY) 11 min   Pt amb to/from the bathroom, tolerated well. Pt returned to bed, left supine with needs within reach. Family present at bedside.  America Silvan Mobility Specialist 03/06/24 12:15 PM

## 2024-03-06 NOTE — Telephone Encounter (Signed)
 Need for 3 month ERCP

## 2024-03-06 NOTE — Plan of Care (Signed)
  Problem: Clinical Measurements: Goal: Diagnostic test results will improve Outcome: Progressing   Problem: Coping: Goal: Level of anxiety will decrease Outcome: Progressing   Problem: Safety: Goal: Ability to remain free from injury will improve Outcome: Progressing   Problem: Skin Integrity: Goal: Risk for impaired skin integrity will decrease Outcome: Progressing

## 2024-03-06 NOTE — TOC Initial Note (Signed)
 Transition of Care [facility]) - Initial/Assessment Note    Patient Details  Name: Jared Gutierrez MRN: [id number] Date of Birth: 1940/12/16  Transition of [facility]) CM/SW Contact:    Daved JONETTA Hamilton, RN Phone Number: 03/06/2024, 11:07 AM  Clinical Narrative:                  Met with patient. Spouse Rock and daughter Odella at bedside. Discussed discharge planning and PT recommendation of HH with PT, patient verbalized agreement with this. Patient verbalized that his wife and daughter could participate in discharge planning/decisions.  Patient receiving HH PT with [facility] prior to this hospital encounter. Family verbalized they would like someone else to come this time however, they do not have a preference. Reviewed list of HH agencies that have accepted in the HUB. Rock verbalized they would like to use [facility] as the #1 choice, patient verbalized agreement to this. Reviewed process and expectations of [facility] scheduling.   Odella will provide transport at discharge today.   Patient and family given opportunity to ask questions and share any concerns, all verbally denied any at this time.   High risk readmission completed.  Expected Discharge Plan: Home w Home Health Services Barriers to Discharge: Barriers Resolved   Patient Goals and CMS Choice            Expected Discharge Plan and Services   Discharge Planning Services: CM Consult   Living arrangements for the past 2 months: Single Family Home Expected Discharge Date: 03/06/24                                    Prior Living Arrangements/Services Living arrangements for the past 2 months: Single Family Home Lives with:: Spouse Patient language and need for interpreter reviewed:: Yes Do you feel safe going back to the place where you live?: Yes      Need for Family Participation in Patient Care: Yes (Comment) (Patient requested to coordinate discharge planning with spouse and daughter) Care giver support system  in place?: Yes (comment) Current home services: Home PT Criminal Activity/Legal Involvement Pertinent to Current Situation/Hospitalization: No - Comment as needed  Activities of Daily Living   ADL Screening (condition at time of admission) Independently performs ADLs?: Yes (appropriate for developmental age) Is the patient deaf or have difficulty hearing?: No Does the patient have difficulty seeing, even when wearing glasses/contacts?: No Does the patient have difficulty concentrating, remembering, or making decisions?: No  Permission Sought/Granted Permission sought to share information with : Family Supports (Spouse Rock and daughter Darien)    Share Information with NAME: Jonmarc Bodkin     Permission granted to share info w Relationship: Spouse  Permission granted to share info w Contact Information: [phone]  Emotional Assessment Appearance:: Appears stated age, Well-Groomed Attitude/Demeanor/Rapport: Engaged Affect (typically observed): Calm Orientation: : Oriented to Self, Oriented to Place, Oriented to  Time, Oriented to Situation Alcohol / Substance Use: Not Applicable Psych Involvement: No (comment)  Admission diagnosis:  Pancreatic mass [K86.89] Elevated LFTs [R79.89] Obstructive jaundice (HCC) [K83.1] Abdominal pain [R10.9] Patient Active Problem List   Diagnosis Date Noted   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   Obstructive jaundice (HCC) 03/04/2024   Elevated LFTs 03/04/2024   Gastritis without bleeding 12/31/2023   Pancreatic mass 12/31/2023   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   Abdominal pain 12/04/2023   HTN (hypertension) 12/04/2023  Obesity (BMI 30-39.9) 12/04/2023   Diarrhea 12/04/2023   GERD (gastroesophageal reflux disease) 09/18/2023   UTI (urinary tract infection) 08/28/2023   Hyponatremia 08/14/2023   Bilateral leg edema 01/30/2023   Lung nodules 01/16/2023   Weight loss 12/19/2022    Subconjunctival hemorrhage 09/12/2022   Epistaxis 07/04/2022   BPH (benign prostatic hyperplasia) 03/28/2022   Need for prophylactic vaccination and inoculation against influenza 03/28/2022   Elevated PSA 01/03/2022   Constipation 10/26/2021   Chemotherapy induced diarrhea 09/28/2021   Hypotension due to hypovolemia 05/18/2021   Port-A-Cath in place 03/08/2021   Anemia due to chemotherapy 12/02/2020   Aortic atherosclerosis 08/02/2020   Hepatic steatosis 08/02/2020   Chemotherapy-induced neutropenia 12/20/2019   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   Cellulitis of left lower extremity 11/03/2019   Pseudogout of foot, left 11/03/2019   Hypomagnesemia 10/29/2019   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   Sepsis (HCC) 08/16/2019   Cellulitis of right lower extremity 08/16/2019   Maintenance chemotherapy 08/16/2019   Hypokalemia 07/23/2019   Lesion of right lung 07/01/2019   Nephrolithiasis 07/01/2019   Osteoarthritis 07/01/2019   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 [place] [address] Phone: [phone] Fax: [phone]     Social Drivers of Health (SDOH) Social History: SDOH Screenings   Food Insecurity: No Food Insecurity (03/04/2024)  Housing: Low Risk  (03/04/2024)  Transportation Needs: No Transportation Needs (03/04/2024)  Utilities: Not At Risk (03/04/2024)  Social Connections: Socially Integrated (03/04/2024)  Tobacco Use: Low Risk  (03/03/2024)   SDOH Interventions:      Readmission Risk Interventions    03/06/2024   10:55 AM  Readmission Risk Prevention Plan  Transportation Screening Complete  PCP or Specialist Appt within 3-5 Days Complete  HRI or Home Care Consult --  Social Work Consult for Recovery Care Planning/Counseling --  Palliative Care Screening Not Applicable  Medication Review [employer]) Complete

## 2024-03-06 NOTE — Progress Notes (Signed)
 Pt discharged home with spouse and daughter in stable condition. DC instructions given. Meds taken to bedside. No immediate questions or concerns at this time. DC'd from unit via wheelchair

## 2024-03-06 NOTE — Discharge Summary (Signed)
 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 *  Hospital Course: Jared Gutierrez is a 83 y.o. male with medical history significant for metastatic colon cancer  on chemotherapy holiday, HTN, BPH, cavitary lung lesion, kidney stone, obesity, presented with abdominal pain and jaundice.   Patient has had chronic abdominal pain which was considered to be related to colon cancer however last week the pain has become worse and patient also noticed discoloration of his skin and went to see oncology ordered a outpatient CT scan which showed dilatation of intrahepatic bile duct and a new mass found on the proximal pancreatic body, radiology recommended MRCP.  Denied any nauseous vomiting or diarrhea.   ED Course: Afebrile, [place] bradycardia blood pressure 110/53 O2 sats 100% on room air.  MRCP showed mass in pancreatic head and pancreatic body, blood work showed AST 188 ALT 102 bilirubin 28, BUN 7 creatinine 0.3 K3.5.  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  Patient is status post ERCP with stent placement.  Will need to follow-up with GI in 3 months for stent removal and replacement.  Will be contacted by GI's office with an appointment. - Bilirubin shows a  downward trend -- Oncology consulted.  Prognosis is poor, patient is made aware, confirmed the patient is DNR/DNI. - Appreciate oncology input, patient to follow-up with oncology as an outpatient.  Was previously on chemotherapy break.  Per oncology patient may resume chemotherapy outpatient if he is able to recover and susceptible condition for further treatments.         Acute hepatic failure Acute coagulopathy Hypoalbuminemia - Evidenced by significant decrease of synthetic liver function, etiology likely metastatic disease to the liver, acute distal CBD obstruction, bile reflux damage - Patient received a one-time dose of 5 mg vitamin K     Anemia of chronic disease Patient noted to have a drop in his H&H from 10.2 >> 7.8 No evidence of bleeding Monitor closely    Hypomagnesemia Supplemented      Consultants: Gastroenterology, oncology Procedures performed: ERCP with stent placement Disposition: Home health Diet recommendation:  Discharge Diet Orders (From admission, onward)     Start     Ordered   03/06/24 0000  Diet - low sodium heart healthy        03/06/24 1202           Regular diet DISCHARGE MEDICATION: Allergies as of 03/06/2024   No Known Allergies      Medication List     STOP taking these medications    Vitamin D 125 MCG (5000 UT) Caps       TAKE these medications    cholestyramine 4 g packet Commonly known as: QUESTRAN Take 1 packet (4 g total) by mouth 3 (three) times daily for 14 days.   cyanocobalamin 500 MCG  tablet Commonly known as: VITAMIN B12 Take by mouth.   feeding supplement Liqd Take 237 mLs by mouth 2 (two) times daily between meals.   lidocaine -prilocaine  cream Commonly known as: EMLA  Apply small amount to port and cover with saran wrap 1-2 hours prior to port access   loratadine 10 MG tablet Commonly known as: CLARITIN Take 10 mg by mouth daily.   omeprazole  20 MG capsule Commonly known as: PRILOSEC Take 20  mg by mouth daily.   ondansetron  8 MG tablet Commonly known as: ZOFRAN  Take 1 tablet (8 mg total) by mouth 2 (two) times daily as needed for refractory nausea / vomiting. Start on day 3 after chemotherapy.   pantoprazole  40 MG tablet Commonly known as: PROTONIX  Take 1 tablet (40 mg total) by mouth every morning. Take in the morning on an empty stomach   PROBIOTIC DAILY PO Take 1 tablet by mouth daily.        Contact information for after-discharge care     Home Medical Care     [facility] [place] [facility]) .   Service: Home Health Services Contact information: [address] [place]  [address] [phone]                    Discharge Exam: [name]   03/03/24 1800 03/06/24 0423  Weight: 84.8 kg 88.9 kg  General: Chronically ill-appearing Eyes: PERRL, lids and conjunctivae normal, scleral icterus ENMT: Mucous membranes are moist. Posterior pharynx clear of any exudate or lesions.Normal dentition.  Neck: normal, supple, no masses, no thyromegaly Respiratory: clear to auscultation bilaterally, no wheezing, no crackles. Normal respiratory effort. No accessory muscle use.  Cardiovascular: Regular rate and rhythm, no murmurs / rubs / gallops. No extremity edema. 2+ pedal pulses. No carotid bruits.  Abdomen: no tenderness, no masses palpated. No hepatosplenomegaly. Bowel sounds positive.  Musculoskeletal: no clubbing / cyanosis. No joint deformity upper and lower extremities. Good ROM, no contractures. Normal muscle tone.  Skin: no rashes, lesions, ulcers. No induration Neurologic: CN 2-12 grossly intact. Sensation intact, DTR normal. Strength 5/5 in all 4.  Psychiatric: Normal judgment and insight. Alert and oriented x 3. Normal mood.        Condition at discharge: stable  The results of significant diagnostics from this hospitalization (including imaging, microbiology, ancillary and laboratory) are listed below  for reference.   Imaging Studies: DG C-Arm 1-60 Min-No Report Result Date: 03/04/2024 Fluoroscopy was utilized by the requesting physician.  No radiographic interpretation.   MR ABDOMEN MRCP W WO CONTAST Result Date: 03/04/2024 EXAM: MRCP WITH AND WITHOUT IV CONTRAST 03/04/2024 01:05:40 AM TECHNIQUE: Multisequence, multiplanar magnetic resonance images of the abdomen with and without intravenous contrast. MRCP sequences were performed. 8 mL gadobutrol (GADAVIST) 1 MMOL/ML injection. COMPARISON: CT 02/27/2024 exam detail is diminished by motion artifact. CLINICAL HISTORY: Jaundice. Patient sent to ED by Dr Babara due to pt having a distended gallbladder and is jaundice. Currently has colon cancer. FINDINGS: LIVER: As noted previously, there is atrophy involving the left lobe of liver. GALLBLADDER AND BILIARY SYSTEM: Moderate gallbladder distention. No significant gallbladder wall thickening. No gallstones identified. Marked intrahepatic bile duct dilatation. The proximal common bile duct measures up to 1.7 cm in diameter, image 20/3. No choledocholithiasis. Abrupt termination of the dilated common bile duct is noted at the level of the head of the pancreas. SPLEEN: Unremarkable. PANCREAS/PANCREATIC DUCT: There is main duct dilatation of the pancreas from the body through the tail.  Obscured by motion artifact, a mass is suspected between the head and body of pancreas which obstructs the common bile duct and the main pancreatic duct as suggested on axial image 16/26 and coronal image 20/3. This measures approximately 4.0 x 2.6 cm, image 36/80. ADRENAL GLANDS: Normal size and morphology bilaterally. No nodule, thickening, or hemorrhage. No periadrenal stranding. KIDNEYS: Bilateral kidney cysts. No follow-up imaging recommended. The largest is in the lower pole of the right kidney measuring 2.8 cm, image 48/25. LYMPH NODES: No upper abdominal adenopathy. VASCULATURE: There appears to be encasement of the superior  mesenteric artery. There is also encasement of the celiac artery. 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 Aortic atherosclerotic calcification. PERITONEUM: Trace ascites. A small volume of free fluid extends over the liver. ABDOMINAL WALL: No hernia. No mass. BOWEL: There is gaseous distention of the colon up to the level of the rectum. No bowel obstruction. BONES: No acute abnormality or worrisome osseous lesion. SOFT TISSUES: Unremarkable. MISCELLANEOUS: Unremarkable. IMPRESSION: 1. Pancreatic mass between the head and body measuring approximately 4.0 x 2.6 cm causing obstruction of the common bile duct and main pancreatic duct; evaluation limited by motion artifact. 2. Vascular involvement with encasement of the superior mesenteric artery and celiac artery. 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 Electronically signed by: Waddell Calk MD 03/04/2024 06:02 AM EDT RP Workstation: [id number]   MR 3D Recon At Scanner Result Date: 03/04/2024 EXAM: MRCP WITH AND WITHOUT IV CONTRAST 03/04/2024 01:05:40 AM TECHNIQUE: Multisequence, multiplanar magnetic resonance images of the abdomen with and without intravenous contrast. MRCP sequences were performed. 8 mL gadobutrol (GADAVIST) 1 MMOL/ML injection. COMPARISON: CT 02/27/2024 exam detail is diminished by motion artifact. CLINICAL HISTORY: Jaundice. Patient sent to ED by Dr Babara due to pt having a distended gallbladder and is jaundice. Currently has colon cancer. FINDINGS: LIVER: As noted previously, there is atrophy involving the left lobe of liver. GALLBLADDER AND BILIARY SYSTEM: Moderate gallbladder distention. No significant gallbladder wall thickening. No gallstones identified. Marked intrahepatic bile duct dilatation. The proximal common bile duct measures up to 1.7 cm in diameter, image 20/3. No choledocholithiasis. Abrupt termination of the dilated common bile duct is noted  at the level of the head of the pancreas. SPLEEN: Unremarkable. PANCREAS/PANCREATIC DUCT: There is main duct dilatation of the pancreas from the body through the tail. Obscured by motion artifact, a mass is suspected between the head and body of pancreas which obstructs the common bile duct and the main pancreatic duct as suggested on axial image 16/26 and coronal image 20/3. This measures approximately 4.0 x 2.6 cm, image 36/80. ADRENAL GLANDS: Normal size and morphology bilaterally. No nodule, thickening, or hemorrhage. No periadrenal stranding. KIDNEYS: Bilateral kidney cysts. No follow-up imaging recommended. The largest is in the lower pole of the right kidney measuring 2.8 cm, image 48/25. LYMPH NODES: No upper abdominal adenopathy. VASCULATURE: There appears to be encasement of the superior mesenteric artery. There is also encasement of the celiac artery. 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 Aortic atherosclerotic calcification. PERITONEUM: Trace ascites. A small volume of free fluid extends over the liver. ABDOMINAL WALL: No hernia. No mass. BOWEL: There is gaseous distention of the colon up to the level of the rectum. No bowel obstruction. BONES: No acute abnormality or worrisome osseous lesion. SOFT TISSUES: Unremarkable. MISCELLANEOUS: Unremarkable. IMPRESSION: 1. Pancreatic mass between the head and body measuring approximately 4.0 x 2.6 cm causing obstruction of  the common bile duct and main pancreatic duct; evaluation limited by motion artifact. 2. Vascular involvement with encasement of the superior mesenteric artery and celiac artery. 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 Electronically signed by: Waddell Calk MD 03/04/2024 06:02 AM EDT RP Workstation: [id number]   CT CHEST ABDOMEN PELVIS W CONTRAST Result Date: 02/27/2024 CLINICAL DATA:  colon cancer. * Tracking Code: BO * EXAM: CT CHEST, ABDOMEN,  AND PELVIS WITH CONTRAST TECHNIQUE: Multidetector CT imaging of the chest, abdomen and pelvis was performed following the standard protocol during bolus administration of intravenous contrast. RADIATION DOSE REDUCTION: This exam was performed according to the departmental dose-optimization program which includes automated exposure control, adjustment of the mA and/or kV according to patient size and/or use of iterative reconstruction technique. CONTRAST:  OMNIPAQUE  IOHEXOL  300 MG/ML  SOLN COMPARISON:  CT scan abdomen and pelvis from 12/04/2023 and CT scan chest, abdomen and pelvis from 10/30/2023. FINDINGS: CT CHEST FINDINGS Cardiovascular: Normal cardiac size. No pericardial effusion. No aortic aneurysm. There are coronary artery calcifications, in keeping with coronary artery disease. There are also mild peripheral atherosclerotic vascular calcifications of thoracic aorta and its major branches. Aberrant origin of right subclavian artery noted, which arises distal to the left subclavian artery and courses towards the right side, posterior to the esophagus. Mediastinum/Nodes: Visualized thyroid  gland appears grossly unremarkable. No solid / cystic mediastinal masses. The esophagus is nondistended precluding optimal assessment. No axillary, mediastinal or hilar lymphadenopathy by size criteria. Lungs/Pleura: The central tracheo-bronchial tree is patent. Redemonstration of combination of emphysematous changes as well as bronchiectatic changes mainly at bilateral lung bases, right more than left. No mass or consolidation. No pleural effusion or pneumothorax. There is a new, 3 x 5 mm solid noncalcified nodule in the left upper lobe (series 3, image 49). There is a stable previously seen sub 4 mm noncalcified nodule in the right upper lobe (series 3, image 56). Musculoskeletal: A CT Port-a-Cath is seen in the right upper chest wall with the catheter terminating in the right atrium. Visualized soft tissues of the  chest wall are otherwise grossly unremarkable. No suspicious osseous lesions. There are mild multilevel degenerative changes in the visualized spine. CT ABDOMEN PELVIS FINDINGS Hepatobiliary: The liver is normal in size. Non-cirrhotic configuration. No suspicious mass. Since the prior study, there is new moderate intrahepatic bile duct dilation as well as moderate-to-severe extrahepatic bile duct dilation. Gallbladder is distended measuring up to 5.7 cm in width. No abnormal wall thickening or pericholecystic fat stranding. No calcified gallstones. Pancreas: Redemonstration of heterogeneous appearance of the pancreas. There is a relatively well-circumscribed 2.1 x 2.2 cm hypoattenuating lesion in the proximal body, which appears grossly similar to the prior study. Upstream to it, there is atrophy of pancreas with prominent main pancreatic duct however, grossly similar to the prior study. There is diffuse mild atrophy of the pancreatic head/uncinate process as well. There is a single dystrophic calcification in the pancreatic body, nonspecific but commonly seen as a sequela of chronic pancreatitis. However, no acute peripancreatic fat stranding noted. Spleen: Within normal limits. No focal lesion. Adrenals/Urinary Tract: Adrenal glands are unremarkable. No suspicious renal mass. There are at least 2 simple cysts in the right kidney lower pole with larger cyst measuring up to 2.9 x 4.2 cm. There is a sinus cyst in the left kidney interpolar region. No nephroureterolithiasis or obstructive uropathy. Urinary bladder is under distended, precluding optimal assessment. However, no large mass or stones identified. No perivesical fat  stranding. Stomach/Bowel: No disproportionate dilation of the small or large bowel loops. There is apparent mild irregular wall thickening of the left paramedian transverse colon (series 2, image 58), which is likely accentuated by underdistention. Findings are nonspecific but can be seen with  primary or secondary colitis or other etiologies including subserosal metastatic deposits or primary malignancy. Correlate clinically. This area is new since the prior study from 12/04/2023. No other evidence of abnormal bowel wall thickening or inflammatory changes. The appendix was not visualized; however there is no acute inflammatory process in the right lower quadrant. Vascular/Lymphatic: No ascites or pneumoperitoneum. No abdominal or pelvic lymphadenopathy, by size criteria. No aneurysmal dilation of the major abdominal arteries. Reproductive: Enlarged prostate. Symmetric seminal vesicles. Other: Midline surgical scar noted. There is a small fat containing umbilical hernia. The soft tissues and abdominal wall are otherwise unremarkable. Musculoskeletal: No suspicious osseous lesions. There are mild multilevel degenerative changes in the visualized spine. IMPRESSION: 1. There is a new 3 x 5 mm solid noncalcified nodule in the left upper lobe. Otherwise, no metastatic disease identified within the chest. 2. There is apparent mild irregular wall thickening of the left paramedian transverse colon, which is likely accentuated by underdistention. Findings are nonspecific but can be seen with primary or secondary colitis or other etiologies including neoplastic process. 3. There is new moderate intrahepatic bile duct dilation as well as moderate-to-severe extrahepatic bile duct dilation. Gallbladder is distended measuring up to 5.7 cm in width. No abnormal wall thickening or pericholecystic fat stranding. No calcified gallstones. Correlate clinically and with liver function tests to determine the need for further evaluation with MRI/MRCP. 4. Redemonstration of heterogeneous hypoattenuating mass centered in the proximal pancreatic body. 5. Multiple other nonacute observations, as described above. Electronically Signed   By: Ree Molt M.D.   On: 02/27/2024 15:02    Microbiology: Results for orders placed or  performed in visit on 01/29/24  C difficile quick screen w PCR reflex     Status: None   Collection Time: 01/29/24  3:58 PM   Specimen: STOOL  Result Value Ref Range Status   C Diff antigen NEGATIVE NEGATIVE Final   C Diff toxin NEGATIVE NEGATIVE Final   C Diff interpretation No C. difficile detected.  Final    Comment: Performed at [facility], [address] [place], [place] [address]    Labs: CBC: Recent Labs  Lab 03/03/24 1807 03/05/24 0553  WBC 4.9 4.5  HGB 10.2* 7.8*  HCT 28.8* 22.3*  MCV 90.9 89.9  PLT 189 127*   Basic Metabolic Panel: Recent Labs  Lab 03/03/24 1807 03/06/24 0329  NA 138 136  K 3.5 3.5  CL 106 110  CO2 23 20*  GLUCOSE 109* 97  BUN 7* 5*  CREATININE <0.30* 0.31*  CALCIUM  9.4 8.1*  MG  --  1.5*  PHOS  --  2.5   Liver Function Tests: Recent Labs  Lab 03/03/24 1807 03/05/24 0553 03/06/24 0907  AST 188* 160* 116*  ALT 102* 76* 76*  ALKPHOS 342* 253* 274*  BILITOT 28.8* 19.4* 14.9*  PROT 6.0* 4.6* 5.8*  ALBUMIN 2.4* 1.8* 2.1*   CBG: No results for input(s): GLUCAP in the last 168 hours.  Discharge time spent: greater than 30 minutes.  Signed: Aimee Somerset, MD [facility] 03/06/2024

## 2024-03-06 NOTE — Plan of Care (Signed)
  Problem: Clinical Measurements: Goal: Diagnostic test results will improve Outcome: Progressing   Problem: Clinical Measurements: Goal: Ability to maintain clinical measurements within normal limits will improve Outcome: Completed/Met Goal: Will remain free from infection Outcome: Completed/Met Goal: Respiratory complications will improve Outcome: Completed/Met Goal: Cardiovascular complication will be avoided Outcome: Completed/Met   Problem: Activity: Goal: Risk for activity intolerance will decrease Outcome: Completed/Met   Problem: Nutrition: Goal: Adequate nutrition will be maintained Outcome: Completed/Met   Problem: Coping: Goal: Level of anxiety will decrease Outcome: Completed/Met   Problem: Elimination: Goal: Will not experience complications related to bowel motility Outcome: Completed/Met Goal: Will not experience complications related to urinary retention Outcome: Completed/Met

## 2024-03-06 NOTE — Care Management Important Message (Signed)
 Important Message  Patient Details  Name: Jared Gutierrez MRN: [id number] Date of Birth: [birth year]   Important Message Given:  Yes - Medicare IM     Rojelio SHAUNNA Rattler 03/06/2024, 1:50 PM

## 2024-03-12 ENCOUNTER — Encounter: Payer: Self-pay | Admitting: Oncology

## 2024-03-12 ENCOUNTER — Inpatient Hospital Stay: Attending: Oncology

## 2024-03-12 ENCOUNTER — Telehealth: Payer: Self-pay

## 2024-03-12 ENCOUNTER — Inpatient Hospital Stay (HOSPITAL_BASED_OUTPATIENT_CLINIC_OR_DEPARTMENT_OTHER): Admitting: Oncology

## 2024-03-12 ENCOUNTER — Inpatient Hospital Stay

## 2024-03-12 VITALS — BP 110/70 | HR 77 | Temp 98.3°F | Resp 18 | Wt 187.6 lb

## 2024-03-12 DIAGNOSIS — C184 Malignant neoplasm of transverse colon: Secondary | ICD-10-CM

## 2024-03-12 DIAGNOSIS — K831 Obstruction of bile duct: Secondary | ICD-10-CM

## 2024-03-12 DIAGNOSIS — E876 Hypokalemia: Secondary | ICD-10-CM | POA: Diagnosis not present

## 2024-03-12 DIAGNOSIS — R978 Other abnormal tumor markers: Secondary | ICD-10-CM | POA: Insufficient documentation

## 2024-03-12 DIAGNOSIS — R601 Generalized edema: Secondary | ICD-10-CM | POA: Diagnosis not present

## 2024-03-12 DIAGNOSIS — K869 Disease of pancreas, unspecified: Secondary | ICD-10-CM | POA: Diagnosis not present

## 2024-03-12 DIAGNOSIS — D649 Anemia, unspecified: Secondary | ICD-10-CM

## 2024-03-12 DIAGNOSIS — R5383 Other fatigue: Secondary | ICD-10-CM | POA: Insufficient documentation

## 2024-03-12 DIAGNOSIS — R609 Edema, unspecified: Secondary | ICD-10-CM | POA: Insufficient documentation

## 2024-03-12 DIAGNOSIS — R600 Localized edema: Secondary | ICD-10-CM | POA: Diagnosis not present

## 2024-03-12 LAB — CBC WITH DIFFERENTIAL (CANCER CENTER ONLY)
Abs Immature Granulocytes: 0.04 K/uL (ref 0.00–0.07)
Basophils Absolute: 0 K/uL (ref 0.0–0.1)
Basophils Relative: 1 %
Eosinophils Absolute: 0.1 K/uL (ref 0.0–0.5)
Eosinophils Relative: 2 %
HCT: 25.5 % — ABNORMAL LOW (ref 39.0–52.0)
Hemoglobin: 8.6 g/dL — ABNORMAL LOW (ref 13.0–17.0)
Immature Granulocytes: 1 %
Lymphocytes Relative: 7 %
Lymphs Abs: 0.4 K/uL — ABNORMAL LOW (ref 0.7–4.0)
MCH: 32.1 pg (ref 26.0–34.0)
MCHC: 33.7 g/dL (ref 30.0–36.0)
MCV: 95.1 fL (ref 80.0–100.0)
Monocytes Absolute: 0.5 K/uL (ref 0.1–1.0)
Monocytes Relative: 9 %
Neutro Abs: 4.6 K/uL (ref 1.7–7.7)
Neutrophils Relative %: 80 %
Platelet Count: 139 K/uL — ABNORMAL LOW (ref 150–400)
RBC: 2.68 MIL/uL — ABNORMAL LOW (ref 4.22–5.81)
RDW: 17 % — ABNORMAL HIGH (ref 11.5–15.5)
WBC Count: 5.7 K/uL (ref 4.0–10.5)
nRBC: 0 % (ref 0.0–0.2)

## 2024-03-12 LAB — RETIC PANEL
Immature Retic Fract: 16 % — ABNORMAL HIGH (ref 2.3–15.9)
RBC.: 2.65 MIL/uL — ABNORMAL LOW (ref 4.22–5.81)
Retic Count, Absolute: 80 K/uL (ref 19.0–186.0)
Retic Ct Pct: 3 % (ref 0.4–3.1)
Reticulocyte Hemoglobin: 34.3 pg (ref >27.9)

## 2024-03-12 LAB — CMP (CANCER CENTER ONLY)
ALT: 38 U/L (ref 0–44)
AST: 63 U/L — ABNORMAL HIGH (ref 15–41)
Albumin: 2.1 g/dL — ABNORMAL LOW (ref 3.5–5.0)
Alkaline Phosphatase: 214 U/L — ABNORMAL HIGH (ref 38–126)
Anion gap: 6 (ref 5–15)
BUN: <5 mg/dL — ABNORMAL LOW (ref 8–23)
CO2: 16 mmol/L — ABNORMAL LOW (ref 22–32)
Calcium: 8.3 mg/dL — ABNORMAL LOW (ref 8.9–10.3)
Chloride: 108 mmol/L (ref 98–111)
Creatinine: 0.43 mg/dL — ABNORMAL LOW (ref 0.61–1.24)
GFR, Estimated: >60 mL/min (ref >60)
Glucose, Bld: 117 mg/dL — ABNORMAL HIGH (ref 70–99)
Potassium: 3.1 mmol/L — ABNORMAL LOW (ref 3.5–5.1)
Sodium: 130 mmol/L — ABNORMAL LOW (ref 135–145)
Total Bilirubin: 13.9 mg/dL (ref 0.0–1.2)
Total Protein: 5.3 g/dL — ABNORMAL LOW (ref 6.5–8.1)

## 2024-03-12 LAB — SAMPLE TO BLOOD BANK

## 2024-03-12 LAB — HEPATIC FUNCTION PANEL
ALT: 40 U/L (ref 0–44)
AST: 63 U/L — ABNORMAL HIGH (ref 15–41)
Albumin: 2.2 g/dL — ABNORMAL LOW (ref 3.5–5.0)
Alkaline Phosphatase: 213 U/L — ABNORMAL HIGH (ref 38–126)
Bilirubin, Direct: 7.1 mg/dL — ABNORMAL HIGH (ref 0.0–0.2)
Indirect Bilirubin: 6.5 mg/dL — ABNORMAL HIGH (ref 0.3–0.9)
Total Bilirubin: 13.9 mg/dL (ref 0.0–1.2)
Total Protein: 5.3 g/dL — ABNORMAL LOW (ref 6.5–8.1)

## 2024-03-12 MED ORDER — POTASSIUM CHLORIDE CRYS ER 20 MEQ PO TBCR
20.0000 meq | EXTENDED_RELEASE_TABLET | Freq: Every day | ORAL | 0 refills | Status: DC
Start: 2024-03-12 — End: 2024-03-20

## 2024-03-12 MED ORDER — FUROSEMIDE 20 MG PO TABS: 20.0000 mg | ORAL_TABLET | Freq: Every day | ORAL | 0 refills | Status: DC | PRN

## 2024-03-12 NOTE — Assessment & Plan Note (Signed)
 CT images shows slight lung progression -Finished palliative radiation on 03/05/2023. Previously on maintenance therapy with 5-FU and bevacizumab . EUS biopsy showed adenocarcinoma, consistent with colorectal origin.  Elevated CA 19-9 likely secondary to inflammation. Obstructive jaundice due to enlargement of soft tissue mass.  Status post stenting. Bilirubin is increasing.   Patient still weak with poor performance status.  I recommend weekly monitoring bilirubin level weekly.  Reaccessing 2 weeks Consider switch to [facility] +/- bevacizumab .  Other options include regorafenib, or fruquintinib once bilirubin further improves.

## 2024-03-12 NOTE — Progress Notes (Signed)
 Hematology/Oncology Progress note Telephone:([phone] [phone]    Chief Complaint: Jared Gutierrez is a 83 y.o.  male presents for follow-up of metastatic colon cancer   ASSESSMENT & PLAN:   Cancer Staging  Malignant neoplasm of transverse colon [facility]) Staging form: Colon and Rectum, [name], rcN0, rcM1) - Signed by Babara Call, MD on 10/26/2021   Malignant neoplasm of transverse colon [facility]) CT images shows slight lung progression -Finished palliative radiation on 03/05/2023. Previously on maintenance therapy with 5-FU and bevacizumab . EUS biopsy showed adenocarcinoma, consistent with colorectal origin.  Elevated CA 19-9 likely secondary to inflammation. Obstructive jaundice due to enlargement of soft tissue mass.  Status post stenting. Bilirubin is increasing.   Patient still weak with poor performance status.  I recommend weekly monitoring bilirubin level weekly.  Reaccessing 2 weeks Consider switch to [facility] +/- bevacizumab .  Other options include regorafenib, or fruquintinib once bilirubin further improves.  Normocytic anemia Hemoglobin has improved slightly.  Monitor  Edema Bilateral lower extremities since hospitalization.  I recommend Lasix  20mg  daily PRN edema.   Hypokalemia Potassium  potassium chloride  20meq daily.   Obstructive jaundice (HCC) S/p stenting, needs removal of the stent and a sphincterotomy with metal stent insertion in 3 months -Jan 2026   Follow-up per LOS  All questions were answered. The patient knows to call the clinic with any problems, questions or concerns.  Call Babara, MD, PhD [facility] Hematology Oncology 03/12/2024     PERTINENT ONCOLOGY HISTORY Jared Gutierrez is a 83 y.o.[place] who has above oncology history reviewed by me today presented for follow up visit for management of Metastatic colon cancer. [name], patient switched care to me on  11/11/20 Extensive medical record review was performed by me  Oncology History  History of colon cancer, stage I  10/02/2014 Pathology Results   [id number] colonoscopy pathology A. CECAL POLYPS; HOT SNARE AND COLD BIOPSY:  - TUBULAR ADENOMA, MULTIPLE FRAGMENTS.  - NEGATIVE FOR HIGH-GRADE DYSPLASIA AND MALIGNANCY.   B. COLON POLYP, TRANSVERSE; COLD BIOPSY:  - TUBULAR ADENOMA.  - NEGATIVE FOR HIGH-GRADE DYSPLASIA AND MALIGNANCY.     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  Tumor extended into the submucosa. Margins were negative. + lymphovascular invasion. 14 lymph nodes were negative. Pathologic stage was T1 N0.  TRANSVERSE COLON, RESECTION:  - INVASIVE ADENOCARCINOMA ARISING IN A 4.6 CM TUBULOVILLOUS  ADENOMA WITH HIGH GRADE DYSPLASIA (MALIGNANT POLYP).  - SEE SUMMARY BELOW.  SABRA  ONCOLOGY SUMMARY: COLON AND RECTUM, RESECTION, AJCC 7TH EDITION  Specimen: transverse colon  Procedure: transverse colon resection  Tumor site: splenic flexure  Tumor size: 1.0 cm (invasive  component)  Macroscopic tumor perforation: not specified  Histologic type: invasive adenocarcinoma  Histologic grade: moderately differentiated   Microscopic tumor extension: into submucosa  Margins:       Proximal margin: negative       Distal margin: negative       Circumferential (radial) or mesenteric margin: negative       If all margins uninvolved by invasive carcinoma:       Distance of invasive carcinoma from closest margin: 7.5 cm  to distal margin  Treatment effect: not applicable  Lymph-vascular invasion: present  Perineural invasion: not identified  Tumor deposits (discontinuous extramural extension): not  identified  Pathologic staging:       Primary tumor: pT1       Regional lymph nodes: pN0            Number of nodes examined: 14            Number of nodes involved: 0    04/02/2019 Progression   04/02/2019  PET scan  limited evealed a 2.4 x 2.3 cm (SUV 11) hypermetabolic soft tissue density caudal and anterior to the pancreatic neck favored to represent isolated peritoneal or nodal metastasis in the setting of prior transverse colonic resection (expected primary drainage). Although this was immediately adjacent to the pancreas, a fat plane was maintained, arguing strongly against a pancreatic primary. Otherwise, there was no evidence of hypermetabolic metastasis. 04/17/2019 EUS on  revealed a normal esophagus, stomach, duodenum, and pancreas.  There was a 2.4 x 2.4 cm irregular mass in the retroperitoneum adjacent to, but not involving the pancreatic neck.  FNA and core needle biopsy were performed.  Pathology revealed adenocarcinoma compatible with a metastatic lesion of colorectal origin.  Tumor cells were positive for CK20 and CDX2 and negative for CK7.  NGS: [employer] on 05/15/2019 revealed + [id number] and TP53.  Negative results included BRAF V600E, Her2, NRAS, NTRK, PD-L1 (<1%), and TMB 8.7/Mb (intermediate).  MMR testing from his colon resection on 10/16/2013 was intact with a low probability of MSI-H.   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  He received Neulasta  after cycle #4 and #5 secondary to progressive leukopenia.  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  He received a truncated course of FOLFOX with cycle #11 secondary to oxaliplatin  reaction.   01/14/2020 Imaging    PET revealed an interval decrease in size and FDG uptake (2.4 x 2.3 cm with SUV 11 to 2.4 x 1.7 cm with SUV 4.09) associated with the previously referenced soft tissue density caudal and anterior to the pancreatic neck suggesting treatment response. There were no new sites of FDG avid tumor.   08/31/2020 Imaging   08/31/2020 Abdomen and pelvis CT revealed increased size of the index soft tissue lesion inferior and anterior to the pancreatic neck (2.9 x 1.8 cm to 3.5 x 2.9 cm). There were similar prominent retroperitoneal lymph nodes without adenopathy by size criteria. There was no new or enlarging abdominal or pelvic lymph nodes. There were no new interval findings. There was similar circumferential wall thickening of a nondistended urinary bladder, which likely accentuated wall thickening There was hepatic steatosis and aortic atherosclerosis.   11/03/2020, PET showed recurrent peritoneal metastasis in the upper abdomen adjacent to the pancreas.  The lesion is 3.8 x 2.9 cm with SUV of 11.3. No evidence  of metastatic peritoneal disease elsewhere in the abdomen pelvis.  No evidence of distant metastasis.   11/03/2020 Imaging   PET scan showed recurrent peritoneal metastasis near pancreas. Given that he has no other distant metastasis. Discussed with radiation oncology. Repeat SBRT may be considered if he does not respond well to systemic chemotherapy.     11/26/2020 -  Chemotherapy   5-FU and bevacizumab .   02/17/2021 Imaging   02/17/2021 CT abdomen pelvis showed partial response. Mild decrease of peritoneal lesion.  Proceed with 5-FU and bevacizumab  today.  Given that  he is tolerating current regimen with good life quality, partial response.  Shared decision was made to hold off adding additional chemotherapy agents.   06/08/2021, CT chest abdomen pelvis with contrast showed soft tissue mass at the base of mesentery inferior to the pancreatic neck is unchanged in size.  No progressive disease was identified.   09/07/2021, CT chest abdomen pelvis with contrast showed no substantial changes in size of the mesenteric mass, 2.6 cm.  No suspicious new lesions.  Chronic findings as detailed in the imaging report.   12/05/2021 Imaging   PET scan showed soft tissue mesenteric mass is unchanged in size.  Decreased FDG uptake compared to activity on November 03, 2020.  Fever treated disease.  No new metastatic disease.   03/21/2022 Imaging   CT chest abdomen pelvis  1. Similar size of the peripancreatic presumed peritoneal implant compared to 09/07/2021. 2. No new or progressive disease. 3. Incidental findings, including: Coronary artery atherosclerosis.Aortic Atherosclerosis (ICD10-I70.0). Prostatomegaly with chronic bladder wall thickening, suggesting outlet obstruction.     07/11/2022 Imaging   CT chest abdomen pelvis with contrast showed 1. New 6 mm nodule in the right upper lobe and 2 mm nodule in the left upper lobe. Although small these are concerning. Recommend short follow-up in 3 months 2. Stable low-attenuation lesion along the midbody of the pancreas. Continued attention on follow-up. No new peritoneal mass lesions or ascites. 3. Fatty liver infiltration 4. Colonic surgical changes   10/03/2022 Imaging   CT chest abdomen pelvis w contrast showed 1. Slight interval enlargement and cavitation of a nodule in the right upper lobe, measuring 0.6 cm, previously 0.5 cm. Slight interval enlargement of a left upper lobe nodule measuring 0.3 cm,previously 0.2 cm. Findings are concerning for slowly enlarging small pulmonary metastases. 2. No significant change in a  lobulated soft tissue mass within the central small bowel mesentery abutting the inferior aspect of the pancreas, consistent with a stable, treated metastasis. 3. No other evidence of lymphadenopathy or metastatic disease in the chest, abdomen, or pelvis. 4. Prostatomegaly with diffuse wall thickening of the relatively decompressed urinary bladder, likely secondary to chronic outlet obstruction. 5. Unchanged fibrotic scarring and bronchiectasis of the bilateral lung bases. This could be further assessed by pulmonary referral and dedicated interstitial lung disease protocol CT examination of the chest if and when clinically appropriate in the setting of known metastatic malignancy. Aortic Atherosclerosis    12/27/2022 Imaging   CT chest abdomen pelvis w contrast showed 1. Continued slight interval enlargement of a cavitary nodule in the right upper lobe measuring 0.7 x 0.6 cm, as well as a nodule in the peripheral left upper lobe measuring 0.5 cm. These are highly suspicious for slowly enlarging pulmonary metastases but remain below size threshold for reliable PET-CT characterization. 2. Unchanged, lobulated hypodense soft tissue mass in the central small bowel mesentery, consistent with treated metastatic disease. 3. Status post transverse colon resection and reanastomosis.  4. Thickening of the distal sigmoid colon and rectum, consistent with nonspecific infectious or inflammatory colitis. 5. Prostatomegaly with diffuse thickening of the urinary bladder wall, likely secondary to chronic outlet obstruction.   Aortic Atherosclerosis (ICD10-I70.0).   01/09/2023 Imaging   PET scan showed Status post transverse colectomy. 4.2 cm soft tissue mass in the central abdominal mesentery, suspicious for residual/recurrent nodal metastasis. Additional 8 mm short axis left para-aortic nodal metastasis. 7 mm cavitary right upper lobe nodule and 5 mm left upper lobe nodule, both beneath the size threshold for PET  sensitivity, but suspicious for small pulmonary metastases.   01/30/2023 -  Radiation Therapy   Palliative radiation to mesenteric soft tissue mass.    05/07/2023 Imaging   CT chest abdomen pelvis w contrast showed  Focal nodular area along the inferior margin of the midbody of the pancreas similar to previous. There is also some abnormal soft tissue extending into the porta hepatis encasing the hepatic artery which compared to previous is also slightly increased.   New small soft tissue nodule adjacent to the SMA proximally.   Previous hypermetabolic node left para-aortic is similar in size to previous.   Stable bilateral small lung nodules. Right lesions again cavitary. No new lung nodules.   Stable surgical changes.  Chest port.     Imaging     08/06/2023 Imaging   CT chest abdomen pelvis w contrast showed Small nodules are again seen. These appears slightly smaller overall from previous. No new lung nodule identified at this time.   Lesion along the inferior margin of the midbody of the pancreas similar to the prior examination. Persistent ill-defined soft tissue extending along the porta hepatis encasing the course of the [facility] artery. Small lymph nodes elsewhere in the upper retroperitoneum are stable.     10/30/2023 Imaging   CT chest abdomen pelvis w contrast showed  1. Relatively similar appearance of mass centered in the pancreatic neck and adjacent infiltrative soft tissue about the common hepatic artery. Given clinical history, this all presumably represents metastatic disease. Pancreatic adenocarcinoma could look similar. 2. No new sites of disease identified. 3. 3-4 mm inferior right upper lobe pulmonary nodule is unchanged. 4. Prostatomegaly with mild bladder wall thickening and chronic pericystic edema, suggesting a component of outlet obstruction. 5. Incidental findings, including: Coronary artery atherosclerosis. Aortic Atherosclerosis (ICD10-I70.0).  Similar right common iliac artery ectasia.   12/31/2023 Procedure   EGD impression No gross lesions in the entire esophagus.  Z-line irregular, 39 cm from incisors. Angulation deformity in the entire stomach.  Erythematous mucosa in the stomach biopsy. Gastric antral vascular ectasia without bleeding found in the atrium.  Treated with argon plasma coagulation.  Duodenal angulation deformity at the duodenal sweep. No close mucosal lesions in the duodenal bulb, in the first portion of the duodenum at in the second portion of the duodenum.  EUS impression A mass was identified in the pancreatic body.  Cytology results are pending.  However the endosonographic appearance is suspicious for adenocarcinoma.  This was staged T2 N0 MX by endosonographic criteria.  The staging applies if malignancy is confirmed.  Fine-needle biopsy performed.  Nonmalignant appearing lymph node were visualized in the gastrohepatic ligament, splenic region, and celiac region.   GASTRIC BIOPSY: - Gastric antral and oxyntic mucosa with chronic gastritis, see comment Immunohistochemical stain for Helicobacter pylori is negative.  PANCREAS, BODY, FINE NEEDLE ASPIRATION: - Malignant cells present - Adenocarcinoma - See comment  Immunohistochemical stains show that the tumor cells are  positive for CDX2 and CK20 while they are negative for CK7, consistent with metastasis from patient's known primary colorectal adenocarcinoma.     Other medical problems Chronic lower extremity edema. 12/24/2018, right lower extremity duplex negative for DVT.  Small right Baker's cyst. 08/16/2019, bilateral lower extremity duplex showed no DVT. 08/16/2019 - 08/17/2019 with right lower extremity cellulitis.  He was unable to bear weight.  He was treated with IV fluids, NSAIDs, colchicine , and broad antibiotics (vancomycin  and Cefepime ).  He was discharged on indomethacin  x 5 days and Keflex  500 mg TID x 5 days.  10/05/2020, colonoscopy showed  internal hemorrhoids.  Otherwise normal examination. 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  Status post barium enema no obstructions noted.  Patient was discharged home.  INTERVAL HISTORY Jared Gutierrez is a 83 y.o. male who has above history reviewed by me today presents for follow up visit for management of recurrent metastatic colon cancer Patient was accompanied by wife. Hospitalized in Oct 2025 due to obstructive jaundice. Bilirubin was 29.  02/27/2024 CT showed new moderate intrahepatic bile duct dilation as well as moderate-to-severe extrahepatic bile duct dilation. 3 x 5 mm solid noncalcified nodule left upper lobe  03/03/24 MRI abdomen MRCP showed  Pancreatic mass between the head and body measuring approximately 4.0 x 2.6 cm causing obstruction of the common bile duct and main pancreatic duc. Vascular involvement with encasement of the superior mesenteric artery and celiac artery. 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  S/p status post ERCP with stent placement, Bilirubin decreased to 14.9 at discharge.   Wife tries to maintain his hydration with liquid IV and water intake. He is also consuming [employer], which he prefers over regular Boost, to improve his nutritional status. Appetite is fair. Weight is stable.  He has noticed swelling in his feet, which was not as pronounced during his hospital stay   Review of Systems  Constitutional:  Positive for fatigue and unexpected weight change. Negative for appetite change, chills, diaphoresis and fever.  HENT:   Negative for hearing loss, nosebleeds, sore throat and tinnitus.   Respiratory:  Negative for cough, hemoptysis and shortness of breath.   Cardiovascular:  Negative for chest pain and palpitations.  Gastrointestinal:  Negative for abdominal pain, blood in stool, constipation, diarrhea, nausea and vomiting.   Genitourinary:  Negative for dysuria, frequency and hematuria.   Musculoskeletal:  Positive for arthralgias. Negative for back pain, myalgias and neck pain.  Skin:  Negative for itching and rash.  Neurological:  Positive for numbness. Negative for dizziness and headaches.  Hematological:  Does not bruise/bleed easily.  Psychiatric/Behavioral:  Negative for depression. The patient is not nervous/anxious.       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   Chicken pox    Colon cancer (HCC)    History of kidney stones    Hypertension    Lipoma of colon    Nephrolithiasis    Nephrolithiasis    Obesity    Shingles    Tubular adenoma of colon    multiple fragments    Past Surgical History:  Procedure Laterality Date   BILIARY STENT PLACEMENT  03/04/2024   Procedure: INSERTION, STENT, BILE DUCT;  Surgeon: Jinny Carmine, MD;  Location: [facility] ENDOSCOPY;  Service: Endoscopy;;   COLON SURGERY  COLONOSCOPY N/A 10/02/2014   Procedure: COLONOSCOPY;  Surgeon: Donnice Vaughn Manes, MD;  Location: [facility] ENDOSCOPY;  Service: Endoscopy;  Laterality: N/A;   COLONOSCOPY WITH PROPOFOL  N/A 02/16/2017   Procedure: COLONOSCOPY WITH PROPOFOL ;  Surgeon: Viktoria Lamar DASEN, MD;  Location: [facility] ENDOSCOPY;  Service: Endoscopy;  Laterality: N/A;   COLONOSCOPY WITH PROPOFOL  N/A 10/05/2020   Procedure: COLONOSCOPY WITH PROPOFOL ;  Surgeon: Maryruth Ole DASEN, MD;  Location: [facility] ENDOSCOPY;  Service: Endoscopy;  Laterality: N/A;   ERCP N/A 03/04/2024   Procedure: ERCP, WITH INTERVENTION IF INDICATED;  Surgeon: Jinny Carmine, MD;  Location: [facility] ENDOSCOPY;  Service: Endoscopy;  Laterality: N/A;   ESOPHAGOGASTRODUODENOSCOPY N/A 12/31/2023   Procedure: EGD (ESOPHAGOGASTRODUODENOSCOPY);  Surgeon: Wilhelmenia Aloha Raddle., MD;  Location: [facility];  Service: Gastroenterology;  Laterality: N/A;   EUS N/A 04/17/2019   Procedure:  FULL UPPER ENDOSCOPIC ULTRASOUND (EUS) RADIAL;  Surgeon: Queenie Asberry LABOR, MD;  Location[facility] ENDOSCOPY;  Service: Gastroenterology;  Laterality: N/A;   EUS N/A 12/31/2023   Procedure: ULTRASOUND, UPPER GI TRACT, ENDOSCOPIC;  Surgeon: Wilhelmenia Aloha Raddle., MD;  Location: [facility] ENDOSCOPY;  Service: Gastroenterology;  Laterality: N/A;   FINE NEEDLE ASPIRATION  12/31/2023   Procedure: FINE NEEDLE ASPIRATION;  Surgeon: Wilhelmenia Aloha Raddle., MD;  Location: [facility] ENDOSCOPY;  Service: Gastroenterology;;   KIDNEY STONE SURGERY     PARTIAL COLECTOMY  10/17/2013   PORTACATH PLACEMENT Right 06/13/2019   Procedure: INSERTION PORT-A-CATH;  Surgeon: Tye Millet, DO;  Location: [facility] ORS;  Service: General;  Laterality: Right;    Family History  Problem Relation Age of Onset   Cancer Mother    Breast cancer Mother    COPD Father     Social History:  reports that he has never smoked. He has never used smokeless tobacco. He reports that he does not currently use alcohol. He reports that he does not use drugs.    Allergies: No Known Allergies  Current Medications: Current Outpatient Medications  Medication Sig Dispense Refill   cholestyramine (QUESTRAN) 4 g packet Take 1 packet (4 g total) by mouth 3 (three) times daily for 14 days. 42 packet 0   cyanocobalamin (VITAMIN B12) 500 MCG tablet Take by mouth.     feeding supplement (ENSURE PLUS HIGH PROTEIN) LIQD Take 237 mLs by mouth 2 (two) times daily between meals. 14220 mL 0   furosemide  (LASIX ) 20 MG tablet Take 1 tablet (20 mg total) by mouth daily as needed for edema. 30 tablet 0   lidocaine -prilocaine  (EMLA ) cream Apply small amount to port and cover with saran wrap 1-2 hours prior to port access 30 g 3   loratadine (CLARITIN) 10 MG tablet Take 10 mg by mouth daily.     omeprazole  (PRILOSEC) 20 MG capsule Take 20 mg by mouth daily.     ondansetron  (ZOFRAN ) 8 MG tablet Take 1 tablet (8 mg total) by mouth 2 (two) times daily as needed for  refractory nausea / vomiting. Start on day 3 after chemotherapy. 60 tablet 1   pantoprazole  (PROTONIX ) 40 MG tablet Take 1 tablet (40 mg total) by mouth every morning. Take in the morning on an empty stomach 60 tablet 0   potassium chloride  SA (KLOR-CON  M) 20 MEQ tablet Take 1 tablet (20 mEq total) by mouth daily. 30 tablet 0   Probiotic Product (PROBIOTIC DAILY PO) Take 1 tablet by mouth daily.     No current facility-administered medications for this visit.   Facility-Administered Medications Ordered in Other Visits  Medication Dose Route  Frequency Provider Last Rate Last Admin   0.9 %  sodium chloride  infusion   Intravenous Once Corcoran, Melissa C, MD       0.9 %  sodium chloride  infusion   Intravenous Continuous Corcoran, Melissa C, MD 10 mL/hr at 12/31/23 1008 Continued from Pre-op at 12/31/23 1008   heparin  lock flush 100 unit/mL  500 Units Intravenous Once Corcoran, Melissa C, MD       sodium chloride  flush (NS) 0.9 % injection 10 mL  10 mL Intracatheter PRN Babara Call, MD   10 mL at 02/01/23 1341    Performance status (ECOG): 1  Vitals Blood pressure 110/70, pulse 77, temperature 98.3 F (36.8 C), temperature source Tympanic, resp. rate 18, weight 187 lb 9.6 oz (85.1 kg), SpO2 95%.   Physical Exam Constitutional:      General: He is not in acute distress.    Appearance: He is obese. He is not diaphoretic.  HENT:     Head: Normocephalic and atraumatic.  Eyes:     General: No scleral icterus. Cardiovascular:     Rate and Rhythm: Normal rate and regular rhythm.     Heart sounds: No murmur heard. Pulmonary:     Effort: Pulmonary effort is normal. No respiratory distress.     Breath sounds: No wheezing.     Comments: Decreased breath sound bilaterally.  Abdominal:     General: Bowel sounds are normal. There is no distension.     Palpations: Abdomen is soft.  Musculoskeletal:        General: Normal range of motion.     Cervical back: Normal range of motion and neck supple.      Comments: +1 edema bilateral lower extremities.   Skin:    General: Skin is warm and dry.     Findings: No erythema.  Neurological:     Mental Status: He is alert and oriented to person, place, and time. Mental status is at baseline.     Motor: No abnormal muscle tone.  Psychiatric:        Mood and Affect: Mood and affect normal.     Labs were reviewed by me.     Latest Ref Rng & Units 03/12/2024    1:04 PM 03/05/2024    5:53 AM 03/03/2024    6:07 PM  CBC  WBC 4.0 - 10.5 K/uL 5.7  4.5  4.9   Hemoglobin 13.0 - 17.0 g/dL 8.6  7.8  89.7   Hematocrit 39.0 - 52.0 % 25.5  22.3  28.8   Platelets 150 - 400 K/uL 139  127  189       Latest Ref Rng & Units 03/12/2024    1:04 PM 03/06/2024    9:07 AM 03/06/2024    3:29 AM  CMP  Glucose 70 - 99 mg/dL 882   97   BUN 8 - 23 mg/dL <5   5   Creatinine 9.38 - 1.24 mg/dL 9.56   9.68   Sodium 864 - 145 mmol/L 130   136   Potassium 3.5 - 5.1 mmol/L 3.1   3.5   Chloride 98 - 111 mmol/L 108   110   CO2 22 - 32 mmol/L 16   20   Calcium  8.9 - 10.3 mg/dL 8.3   8.1   Total Protein 6.5 - 8.1 g/dL 6.5 - 8.1 g/dL 5.3    5.3  5.8    Total Bilirubin 0.0 - 1.2 mg/dL 0.0 - 1.2 mg/dL 13.9  13.9  14.9    Alkaline Phos 38 - 126 U/L 38 - 126 U/L 213    214  274    AST 15 - 41 U/L 15 - 41 U/L 63    63  116    ALT 0 - 44 U/L 0 - 44 U/L 40    38  76

## 2024-03-12 NOTE — Assessment & Plan Note (Signed)
 Potassium  potassium chloride  20meq daily.

## 2024-03-12 NOTE — Assessment & Plan Note (Signed)
 S/p stenting, needs removal of the stent and a sphincterotomy with metal stent insertion in 3 months -Jan 2026

## 2024-03-12 NOTE — Progress Notes (Signed)
 Nutrition Follow-up:  Patient with metastatic colon cancer.  S/p transverse colectomy (2015).  Treatment on hold.  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 S/P ERCP with stent placement.    Met with patient and wife.  Reports that patient is eating 3 meals a day.  Breakfast is sausage, egg, grits and coffee this am.  Daughter prepares peanut butter sandwich with pudding for lunch.  Dinner is meat and few sides.  Drinking Boost Breeze and sometimes Boost VHC.  Denies diarrhea.        Medications: questran, K Cl, lasix   Labs: Na 130, K 3.1, glucose 117, calcium  8.3  Anthropometrics:   Weight 187 lb  200 lb 11.2 oz on 5/6 205 lb 4.8 oz on 4/1 213 lb 8 oz on 05/22/23 206 lb 9.6 oz on 11/12 209 lb 14.4 oz on 02/27/23  NUTRITION DIAGNOSIS: Unintentional weight loss continues    INTERVENTION:  Continue high calorie, high protein foods Continue boost VHC and Boost Breeze     MONITORING, EVALUATION, GOAL: weight trends, intake   NEXT VISIT: Wed, Nov 19   Cia Garretson B. Dasie SOLON, CSO, LDN Registered Dietitian [phone]

## 2024-03-12 NOTE — Telephone Encounter (Signed)
 Critical lab received from [facility] @ [facility] lab. Total bili 13.9. Dr. Babara notified.

## 2024-03-12 NOTE — Assessment & Plan Note (Signed)
 Hemoglobin has improved slightly.  Monitor

## 2024-03-12 NOTE — Assessment & Plan Note (Signed)
 Bilateral lower extremities since hospitalization.  I recommend Lasix  20mg  daily PRN edema.

## 2024-03-13 ENCOUNTER — Other Ambulatory Visit: Payer: Self-pay

## 2024-03-13 LAB — CEA: CEA: 54.6 ng/mL — ABNORMAL HIGH (ref 0.0–4.7)

## 2024-03-13 NOTE — Telephone Encounter (Signed)
 Left message on voicemail.

## 2024-03-14 ENCOUNTER — Emergency Department

## 2024-03-14 ENCOUNTER — Other Ambulatory Visit: Payer: Self-pay

## 2024-03-14 ENCOUNTER — Inpatient Hospital Stay
Admission: EM | Admit: 2024-03-14 | Discharge: 2024-03-20 | DRG: 871 | Disposition: A | Discharge status: Hospice | Attending: Pulmonary Disease | Admitting: Pulmonary Disease

## 2024-03-14 ENCOUNTER — Encounter (HOSPITAL_COMMUNITY): Payer: Self-pay

## 2024-03-14 DIAGNOSIS — C250 Malignant neoplasm of head of pancreas: Secondary | ICD-10-CM

## 2024-03-14 DIAGNOSIS — R188 Other ascites: Secondary | ICD-10-CM | POA: Diagnosis present

## 2024-03-14 DIAGNOSIS — I10 Essential (primary) hypertension: Secondary | ICD-10-CM | POA: Diagnosis present

## 2024-03-14 DIAGNOSIS — E669 Obesity, unspecified: Secondary | ICD-10-CM | POA: Diagnosis present

## 2024-03-14 DIAGNOSIS — Z7901 Long term (current) use of anticoagulants: Secondary | ICD-10-CM

## 2024-03-14 DIAGNOSIS — Z7189 Other specified counseling: Secondary | ICD-10-CM | POA: Diagnosis not present

## 2024-03-14 DIAGNOSIS — D696 Thrombocytopenia, unspecified: Secondary | ICD-10-CM | POA: Diagnosis not present

## 2024-03-14 DIAGNOSIS — A419 Sepsis, unspecified organism: Principal | ICD-10-CM | POA: Diagnosis present

## 2024-03-14 DIAGNOSIS — E43 Unspecified severe protein-calorie malnutrition: Secondary | ICD-10-CM | POA: Diagnosis present

## 2024-03-14 DIAGNOSIS — Z9889 Other specified postprocedural states: Secondary | ICD-10-CM | POA: Diagnosis not present

## 2024-03-14 DIAGNOSIS — E871 Hypo-osmolality and hyponatremia: Secondary | ICD-10-CM | POA: Diagnosis present

## 2024-03-14 DIAGNOSIS — N400 Enlarged prostate without lower urinary tract symptoms: Secondary | ICD-10-CM | POA: Diagnosis present

## 2024-03-14 DIAGNOSIS — K831 Obstruction of bile duct: Secondary | ICD-10-CM | POA: Diagnosis present

## 2024-03-14 DIAGNOSIS — C189 Malignant neoplasm of colon, unspecified: Secondary | ICD-10-CM | POA: Diagnosis not present

## 2024-03-14 DIAGNOSIS — C259 Malignant neoplasm of pancreas, unspecified: Secondary | ICD-10-CM

## 2024-03-14 DIAGNOSIS — R6521 Severe sepsis with septic shock: Secondary | ICD-10-CM | POA: Diagnosis present

## 2024-03-14 DIAGNOSIS — D630 Anemia in neoplastic disease: Secondary | ICD-10-CM | POA: Diagnosis present

## 2024-03-14 DIAGNOSIS — E162 Hypoglycemia, unspecified: Secondary | ICD-10-CM | POA: Diagnosis present

## 2024-03-14 DIAGNOSIS — Z6831 Body mass index (BMI) 31.0-31.9, adult: Secondary | ICD-10-CM

## 2024-03-14 DIAGNOSIS — K810 Acute cholecystitis: Secondary | ICD-10-CM | POA: Diagnosis present

## 2024-03-14 DIAGNOSIS — Z4659 Encounter for fitting and adjustment of other gastrointestinal appliance and device: Secondary | ICD-10-CM | POA: Diagnosis not present

## 2024-03-14 DIAGNOSIS — N179 Acute kidney failure, unspecified: Secondary | ICD-10-CM | POA: Diagnosis not present

## 2024-03-14 DIAGNOSIS — E876 Hypokalemia: Secondary | ICD-10-CM | POA: Diagnosis present

## 2024-03-14 DIAGNOSIS — I469 Cardiac arrest, cause unspecified: Secondary | ICD-10-CM | POA: Diagnosis not present

## 2024-03-14 DIAGNOSIS — Z923 Personal history of irradiation: Secondary | ICD-10-CM

## 2024-03-14 DIAGNOSIS — Z66 Do not resuscitate: Secondary | ICD-10-CM | POA: Diagnosis present

## 2024-03-14 DIAGNOSIS — C7889 Secondary malignant neoplasm of other digestive organs: Secondary | ICD-10-CM | POA: Diagnosis present

## 2024-03-14 DIAGNOSIS — C19 Malignant neoplasm of rectosigmoid junction: Secondary | ICD-10-CM | POA: Diagnosis present

## 2024-03-14 DIAGNOSIS — C7800 Secondary malignant neoplasm of unspecified lung: Secondary | ICD-10-CM | POA: Diagnosis present

## 2024-03-14 DIAGNOSIS — I700 Atherosclerosis of aorta: Secondary | ICD-10-CM | POA: Diagnosis present

## 2024-03-14 DIAGNOSIS — Z515 Encounter for palliative care: Secondary | ICD-10-CM | POA: Diagnosis not present

## 2024-03-14 DIAGNOSIS — Z9221 Personal history of antineoplastic chemotherapy: Secondary | ICD-10-CM

## 2024-03-14 DIAGNOSIS — D6859 Other primary thrombophilia: Secondary | ICD-10-CM | POA: Diagnosis present

## 2024-03-14 DIAGNOSIS — Z860101 Personal history of adenomatous and serrated colon polyps: Secondary | ICD-10-CM

## 2024-03-14 DIAGNOSIS — Z79899 Other long term (current) drug therapy: Secondary | ICD-10-CM

## 2024-03-14 DIAGNOSIS — E8809 Other disorders of plasma-protein metabolism, not elsewhere classified: Secondary | ICD-10-CM | POA: Diagnosis present

## 2024-03-14 DIAGNOSIS — K3189 Other diseases of stomach and duodenum: Secondary | ICD-10-CM | POA: Diagnosis present

## 2024-03-14 DIAGNOSIS — K219 Gastro-esophageal reflux disease without esophagitis: Secondary | ICD-10-CM | POA: Diagnosis not present

## 2024-03-14 DIAGNOSIS — Z825 Family history of asthma and other chronic lower respiratory diseases: Secondary | ICD-10-CM

## 2024-03-14 DIAGNOSIS — K828 Other specified diseases of gallbladder: Secondary | ICD-10-CM | POA: Diagnosis present

## 2024-03-14 DIAGNOSIS — Z87442 Personal history of urinary calculi: Secondary | ICD-10-CM

## 2024-03-14 DIAGNOSIS — Z803 Family history of malignant neoplasm of breast: Secondary | ICD-10-CM

## 2024-03-14 DIAGNOSIS — E872 Acidosis, unspecified: Secondary | ICD-10-CM | POA: Diagnosis present

## 2024-03-14 DIAGNOSIS — Z6832 Body mass index (BMI) 32.0-32.9, adult: Secondary | ICD-10-CM

## 2024-03-14 DIAGNOSIS — K8309 Other cholangitis: Secondary | ICD-10-CM | POA: Diagnosis present

## 2024-03-14 LAB — CBC
HCT: 25.3 % — ABNORMAL LOW (ref 39.0–52.0)
Hemoglobin: 8.8 g/dL — ABNORMAL LOW (ref 13.0–17.0)
MCH: 33 pg (ref 26.0–34.0)
MCHC: 34.8 g/dL (ref 30.0–36.0)
MCV: 94.8 fL (ref 80.0–100.0)
Platelets: 167 K/uL (ref 150–400)
RBC: 2.67 MIL/uL — ABNORMAL LOW (ref 4.22–5.81)
RDW: 16.7 % — ABNORMAL HIGH (ref 11.5–15.5)
WBC: 18.2 K/uL — ABNORMAL HIGH (ref 4.0–10.5)
nRBC: 0 % (ref 0.0–0.2)

## 2024-03-14 LAB — URINALYSIS, COMPLETE (UACMP) WITH MICROSCOPIC
Glucose, UA: NEGATIVE mg/dL
Hgb urine dipstick: NEGATIVE
Ketones, ur: NEGATIVE mg/dL
Leukocytes,Ua: NEGATIVE
Nitrite: NEGATIVE
Protein, ur: 30 mg/dL — AB
Specific Gravity, Urine: 1.025 (ref 1.005–1.030)
pH: 5 (ref 5.0–8.0)

## 2024-03-14 LAB — COMPREHENSIVE METABOLIC PANEL WITH GFR
ALT: 53 U/L — ABNORMAL HIGH (ref 0–44)
AST: 121 U/L — ABNORMAL HIGH (ref 15–41)
Albumin: 2.1 g/dL — ABNORMAL LOW (ref 3.5–5.0)
Alkaline Phosphatase: 278 U/L — ABNORMAL HIGH (ref 38–126)
Anion gap: 11 (ref 5–15)
BUN: 6 mg/dL — ABNORMAL LOW (ref 8–23)
CO2: 18 mmol/L — ABNORMAL LOW (ref 22–32)
Calcium: 8.6 mg/dL — ABNORMAL LOW (ref 8.9–10.3)
Chloride: 105 mmol/L (ref 98–111)
Creatinine, Ser: 0.78 mg/dL (ref 0.61–1.24)
GFR, Estimated: >60 mL/min (ref >60)
Glucose, Bld: 102 mg/dL — ABNORMAL HIGH (ref 70–99)
Potassium: 3 mmol/L — ABNORMAL LOW (ref 3.5–5.1)
Sodium: 134 mmol/L — ABNORMAL LOW (ref 135–145)
Total Bilirubin: 18.5 mg/dL (ref 0.0–1.2)
Total Protein: 5.5 g/dL — ABNORMAL LOW (ref 6.5–8.1)

## 2024-03-14 LAB — LACTIC ACID, PLASMA: Lactic Acid, Venous: 2.5 mmol/L (ref 0.5–1.9)

## 2024-03-14 LAB — LIPASE, BLOOD: Lipase: 23 U/L (ref 11–51)

## 2024-03-14 MED ORDER — FENTANYL CITRATE (PF) 50 MCG/ML IJ SOSY
50.0000 ug | PREFILLED_SYRINGE | Freq: Once | INTRAMUSCULAR | Status: AC
  Administered 2024-03-14: 50 ug via INTRAVENOUS
  Filled 2024-03-14: qty 1

## 2024-03-14 MED ORDER — SODIUM CHLORIDE 0.9 % IV BOLUS
1000.0000 mL | Freq: Once | INTRAVENOUS | Status: AC
  Administered 2024-03-14: 1000 mL via INTRAVENOUS

## 2024-03-14 MED ORDER — IOHEXOL 300 MG/ML  SOLN
100.0000 mL | Freq: Once | INTRAMUSCULAR | Status: AC | PRN
  Administered 2024-03-14: 100 mL via INTRAVENOUS

## 2024-03-14 MED ORDER — ONDANSETRON HCL 4 MG/2ML IJ SOLN
4.0000 mg | Freq: Once | INTRAMUSCULAR | Status: AC
  Administered 2024-03-14: 4 mg via INTRAVENOUS
  Filled 2024-03-14: qty 2

## 2024-03-14 MED ORDER — MORPHINE SULFATE (PF) 4 MG/ML IV SOLN
4.0000 mg | Freq: Once | INTRAVENOUS | Status: AC
  Administered 2024-03-14: 4 mg via INTRAVENOUS
  Filled 2024-03-14: qty 1

## 2024-03-14 MED ORDER — PROMETHAZINE HCL 25 MG/ML IJ SOLN
12.5000 mg | Freq: Once | INTRAMUSCULAR | Status: AC
  Administered 2024-03-14: 12.5 mg via INTRAVENOUS
  Filled 2024-03-14: qty 12.5

## 2024-03-14 MED ORDER — GADOBUTROL 1 MMOL/ML IV SOLN
9.0000 mL | Freq: Once | INTRAVENOUS | Status: AC | PRN
  Administered 2024-03-14: 9 mL via INTRAVENOUS

## 2024-03-14 MED ORDER — PIPERACILLIN-TAZOBACTAM 3.375 G IVPB 30 MIN
3.3750 g | Freq: Once | INTRAVENOUS | Status: AC
  Administered 2024-03-14: 3.375 g via INTRAVENOUS
  Filled 2024-03-14: qty 50

## 2024-03-14 NOTE — ED Provider Notes (Signed)
 [facility] Provider Note    Event Date/Time   First MD Initiated Contact with Patient 03/14/24 1214     (approximate)  History   Chief Complaint: Shortness of Breath and Constipation  HPI  Jared Gutierrez is a 83 y.o. male with a past medical history of arthritis, hypertension, presents to the emergency department for constipation nausea vomiting.  Patient has a history of colon cancer and pancreatic cancer per family.  Has not received chemotherapy since June.  Patient has had a worsening bilirubin due to an obstruction of his biliary duct which was recently stented.  They state that the patient's color has been improving although he remains jaundiced.  The acute concern is over the last 3 to 4 days patient has not had a bowel movement has had worsening abdominal distention and discomfort and today has been nauseated with several episodes of vomiting (nonbloody).  Patient has not urinated today either per family.  Patient is complaining of abdominal pain fairly diffusely throughout the abdomen.  Triage note noted shortness of breath however the patient denies any shortness of breath myself and is satting 100% on room air.  Physical Exam   Triage Vital Signs: ED Triage Vitals  Encounter Vitals Group     BP 03/14/24 1216 105/84     Girls Systolic BP Percentile --      Girls Diastolic BP Percentile --      Boys Systolic BP Percentile --      Boys Diastolic BP Percentile --      Pulse Rate 03/14/24 1216 83     Resp 03/14/24 1216 20     Temp 03/14/24 1216 98.1 F (36.7 C)     Temp Source 03/14/24 1216 Oral     SpO2 03/14/24 1213 100 %     Weight 03/14/24 1217 203 lb (92.1 kg)     Height 03/14/24 1217 5' 8 (1.727 m)     Head Circumference --      Peak Flow --      Pain Score 03/14/24 1217 0     Pain Loc --      Pain Education --      Exclude from Growth Chart --     Most recent vital signs: Vitals:   03/14/24 1213 03/14/24 1216  BP:  105/84  Pulse:   83  Resp:  20  Temp:  98.1 F (36.7 C)  SpO2: 100% 100%    General: Awake, no distress.  Jaundiced appearance, scleral icterus. CV:  Good peripheral perfusion.  Regular rate and rhythm  Resp:  Normal effort.  Equal breath sounds bilaterally.  No wheeze rales or rhonchi. Abd:  Mild distention, mild tenderness diffusely with no focal tenderness identified.  No rebound or guarding.  ED Results / Procedures / Treatments   RADIOLOGY  I have reviewed interpret the chest x-ray images.  Slight opacities bilaterally but no obvious consolidation.  Radiology has read the x-ray as bibasilar opacities consistent with atelectasis versus pneumonia   MEDICATIONS ORDERED IN ED: Medications  sodium chloride  0.9 % bolus 1,000 mL (1,000 mLs Intravenous New Bag/Given 03/14/24 1301)  morphine  (PF) 4 MG/ML injection 4 mg (4 mg Intravenous Given 03/14/24 1251)  ondansetron  (ZOFRAN ) injection 4 mg (4 mg Intravenous Given 03/14/24 1251)     IMPRESSION / MDM / ASSESSMENT AND PLAN / ED COURSE  I reviewed the triage vital signs and the nursing notes.  Patient's presentation is most consistent with acute presentation with potential  threat to life or bodily function.  Patient presents to the emergency department for abdominal distention nausea vomiting and constipation.  Patient has jaundice although family states the color appears to be improving.  However given the patient's symptoms this is concerning for possible urinary retention, small bowel obstruction, fecal impaction, intra-abdominal infection, ileus.  We will check labs we will obtain a CT scan of the abdomen pelvis.  Given the initial shortness of breath complaint we obtained a chest x-ray as a precaution although the patient denies this to myself and is satting well.  We will treat pain and nausea and IV hydrate while awaiting further results.  Patient agreeable to plan of care.  Patient's lab work shows a worsening white blood cell count now 18,000,  anemia largely chronic.  Patient's chemistry shows worsening hyperbilirubinemia as well as elevating LFTs compared to several days ago.  Patient had a biliary stent placed by Dr.Wohl on 03/04/24.  I spoke with Dr. Therisa of GI medicine given the patient's uptrending LFTs and bilirubin he is concern for possible obstruction of the stent, would like an MRCP in addition to the CT scan of the abdomen.  Patient care signed out to oncoming provider CT and MRI pending.  FINAL CLINICAL IMPRESSION(S) / ED DIAGNOSES   Abdominal pain Constipation    Note:  This document was prepared using Dragon voice recognition software and may include unintentional dictation errors.   [name], MD 03/14/24 [phone]

## 2024-03-14 NOTE — ED Notes (Signed)
 CCMD called to admit patient to monitor

## 2024-03-14 NOTE — ED Notes (Signed)
 Patient in MRI

## 2024-03-14 NOTE — ED Notes (Signed)
 Date and time results received: 03/14/24 1417 Test: Bilirubin Critical Value: 18.5  Name of Provider Notified: MD Paduchowksi

## 2024-03-14 NOTE — H&P (Addendum)
 [place]   PATIENT NAME: Jared Gutierrez    MR#:  [id number]  DATE OF BIRTH:  22-Apr-1941  DATE OF ADMISSION:  03/14/2024  PRIMARY CARE PHYSICIAN: Sadie Manna, MD   Patient is coming from: Home  REQUESTING/REFERRING PHYSICIAN: Jossie Birmingham, MD  CHIEF COMPLAINT:   Chief Complaint  Patient presents with   Shortness of Breath   Constipation    HISTORY OF PRESENT ILLNESS:  Jared Gutierrez is a 83 y.o. male with medical history significant for essential hypertension, urolithiasis recurrent colon and pancreatic cancer in chemotherapy holiday from June of this year, with recent biliary obstruction status post biliary stent placement on 03/04/2024 by Dr. Jinny, who presented to the emergency room with acute onset of worsening jaundice and right upper quadrant abdominal pain over the last couple of days.  He had nausea and vomiting today.  No fever or chills.  No dysuria, hematuria or flank pain.  He has been having diminished urine output today.  No cough or wheezing or dyspnea.  No chest pain or palpitations.  ED Course: When he came to the ER, BP was 89/60 and later 112/70 with otherwise normal vital signs.  Labs revealed hypokalemia of 3 and hyponatremia 134 with calcium  of 8.6 and alk phos of 278, albumin 2.1 and AST 121 with ALT 53 with total protein of 5.5 and total bili of 80.5.  Lactic acid was 2.6 and CBC showed leukocytosis of 18.2 with hemoglobin 8.8 hematocrit 25.3 EKG as reviewed by me : EKG showed normal sinus rhythm with a rate of 86 with PACs and right bundle branch block with left anterior fascicular block. Imaging:  Portable chest x-ray showed low lung volumes with bibasilar airspace opacities that may reflect atelectasis or pneumonia.  Abdominal pelvic CT scan with contrast revealed the following: 1. Progressive intrahepatic and extrahepatic biliary duct dilation despite interval placement of bile duct stent. The downstream aspect of the biliary stent projects  near the ampulla not clearly within the lumen of the duodenum, and may not provide adequate decompression of the biliary tree. 2. Marked gallbladder distension without evidence of cholelithiasis or acute cholecystitis. 3. Stable complex cystic and solid mass within the pancreatic body, with continued extrinsic compression upon the portal vein, upstream pancreatic duct dilation, and atrophy of the pancreatic body and tail. This has been previously evaluated by MRI. 4. Distended fluid-filled stomach, which may require decompression with enteric catheter. No evidence of bowel obstruction or ileus. 5. Trace ascites. 6.  Aortic Atherosclerosis.  The patient was given broad-spectrum antibiotic therapy with IV cefepime , vancomycin  and Flagyl  as well as hydration with IV lactated ringer , 4 mg of IV Zofran  twice and 4 mg of IV morphine  sulfate as well as 50 mcg of IV fentanyl .  Dr. Therisa was notified and is aware about the patient.  Dr. Jinny will be available for ERCP on Monday.  IR was notified about the patient in case he needs percutaneous biliary drainage during the weekend.  His family was pleased about being admitted here.  He will be admitted to a progressive unit bed for further evaluation and management. PAST MEDICAL HISTORY:   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   Chicken pox    Colon cancer (HCC)    History of kidney stones    Hypertension    Lipoma of colon  Nephrolithiasis    Nephrolithiasis    Obesity    Shingles    Tubular adenoma of colon    multiple fragments    PAST SURGICAL HISTORY:   Past Surgical History:  Procedure Laterality Date   BILIARY STENT PLACEMENT  03/04/2024   Procedure: INSERTION, STENT, BILE DUCT;  Surgeon: Jinny Carmine, MD;  Location: [facility] ENDOSCOPY;  Service: Endoscopy;;   COLON SURGERY     COLONOSCOPY N/A 10/02/2014   Procedure: COLONOSCOPY;  Surgeon:  Donnice Vaughn Manes, MD;  Location: [facility] ENDOSCOPY;  Service: Endoscopy;  Laterality: N/A;   COLONOSCOPY WITH PROPOFOL  N/A 02/16/2017   Procedure: COLONOSCOPY WITH PROPOFOL ;  Surgeon: Viktoria Lamar DASEN, MD;  Location: [facility] ENDOSCOPY;  Service: Endoscopy;  Laterality: N/A;   COLONOSCOPY WITH PROPOFOL  N/A 10/05/2020   Procedure: COLONOSCOPY WITH PROPOFOL ;  Surgeon: Maryruth Ole DASEN, MD;  Location: [facility] ENDOSCOPY;  Service: Endoscopy;  Laterality: N/A;   ERCP N/A 03/04/2024   Procedure: ERCP, WITH INTERVENTION IF INDICATED;  Surgeon: Jinny Carmine, MD;  Location: [facility] ENDOSCOPY;  Service: Endoscopy;  Laterality: N/A;   ESOPHAGOGASTRODUODENOSCOPY N/A 12/31/2023   Procedure: EGD (ESOPHAGOGASTRODUODENOSCOPY);  Surgeon: Wilhelmenia Aloha Raddle., MD;  Location: [facility];  Service: Gastroenterology;  Laterality: N/A;   EUS N/A 04/17/2019   Procedure: FULL UPPER ENDOSCOPIC ULTRASOUND (EUS) RADIAL;  Surgeon: Queenie Asberry LABOR, MD;  Location: [facility] ENDOSCOPY;  Service: Gastroenterology;  Laterality: N/A;   EUS N/A 12/31/2023   Procedure: ULTRASOUND, UPPER GI TRACT, ENDOSCOPIC;  Surgeon: Wilhelmenia Aloha Raddle., MD;  Location: [facility] ENDOSCOPY;  Service: Gastroenterology;  Laterality: N/A;   FINE NEEDLE ASPIRATION  12/31/2023   Procedure: FINE NEEDLE ASPIRATION;  Surgeon: Wilhelmenia Aloha Raddle., MD;  Location: [facility] ENDOSCOPY;  Service: Gastroenterology;;   KIDNEY STONE SURGERY     PARTIAL COLECTOMY  10/17/2013   PORTACATH PLACEMENT Right 06/13/2019   Procedure: INSERTION PORT-A-CATH;  Surgeon: Tye Millet, DO;  Location: [facility] ORS;  Service: General;  Laterality: Right;    SOCIAL HISTORY:   Social History   Tobacco Use   Smoking status: Never   Smokeless tobacco: Never  Substance Use Topics   Alcohol use: Not Currently    Comment: socially     FAMILY HISTORY:   Family History  Problem Relation Age of Onset   Cancer Mother    Breast cancer Mother    COPD Father     DRUG ALLERGIES:  No Known  Allergies  REVIEW OF SYSTEMS:   ROS As per history of present illness. All pertinent systems were reviewed above. Constitutional, HEENT, cardiovascular, respiratory, GI, GU, musculoskeletal, neuro, psychiatric, endocrine, integumentary and hematologic systems were reviewed and are otherwise negative/unremarkable except for positive findings mentioned above in the HPI.   MEDICATIONS AT HOME:   Prior to Admission medications   Medication Sig Start Date End Date Taking? Authorizing Provider  cholestyramine (QUESTRAN) 4 g packet Take 1 packet (4 g total) by mouth 3 (three) times daily for 14 days. 03/06/24 03/20/24  Lanetta Lingo, MD  cyanocobalamin (VITAMIN B12) 500 MCG tablet Take by mouth. 07/26/23 07/25/24  [provider]  feeding supplement (ENSURE PLUS HIGH PROTEIN) LIQD Take 237 mLs by mouth 2 (two) times daily between meals. 03/06/24 04/05/24  Lanetta Lingo, MD  furosemide  (LASIX ) 20 MG tablet Take 1 tablet (20 mg total) by mouth daily as needed for edema. 03/12/24   Babara Call, MD  lidocaine -prilocaine  (EMLA ) cream Apply small amount to port and cover with saran wrap 1-2 hours prior to port access 08/27/23  Babara Call, MD  loratadine (CLARITIN) 10 MG tablet Take 10 mg by mouth daily.    [provider]  omeprazole  (PRILOSEC) 20 MG capsule Take 20 mg by mouth daily. 12/25/23   [provider]  ondansetron  (ZOFRAN ) 8 MG tablet Take 1 tablet (8 mg total) by mouth 2 (two) times daily as needed for refractory nausea / vomiting. Start on day 3 after chemotherapy. 11/16/20   Babara Call, MD  pantoprazole  (PROTONIX ) 40 MG tablet Take 1 tablet (40 mg total) by mouth every morning. Take in the morning on an empty stomach 12/07/23 03/12/24  Jhonny Calvin NOVAK, MD  potassium chloride  SA (KLOR-CON  M) 20 MEQ tablet Take 1 tablet (20 mEq total) by mouth daily. 03/12/24   Babara Call, MD  Probiotic Product (PROBIOTIC DAILY PO) Take 1 tablet by mouth daily.    [provider]       VITAL SIGNS:  Blood pressure 102/68, pulse 77, temperature (!) 97.5 F (36.4 C), temperature source Axillary, resp. rate 20, height 5' 8 (1.727 m), weight 92.1 kg, SpO2 100%.  PHYSICAL EXAMINATION:  Physical Exam  GENERAL:  83 y.o.-year-old African-American male patient lying in the bed with mild respiratory distress with conversational dyspnea. EYES: Pupils equal, round, reactive to light and accommodation.  Significant scleral icterus. Extraocular muscles intact.  HEENT: Head atraumatic, normocephalic. Oropharynx and nasopharynx clear.  NECK:  Supple, no jugular venous distention. No thyroid  enlargement, no tenderness.  LUNGS: Normal breath sounds bilaterally, no wheezing, rales,rhonchi or crepitation. No use of accessory muscles of respiration.  CARDIOVASCULAR: Regular rate and rhythm, S1, S2 normal. No murmurs, rubs, or gallops.  ABDOMEN: Soft, nondistended, with right upper quadrant abdominal tenderness without rebound tenderness guarding or rigidity bowel sounds present. No organomegaly or mass.  EXTREMITIES: No pedal edema, cyanosis, or clubbing.  NEUROLOGIC: Cranial nerves II through XII are intact. Muscle strength 5/5 in all extremities. Sensation intact. Gait not checked.  PSYCHIATRIC: The patient is alert and oriented x 3.  Normal affect and good eye contact. SKIN: Positive jaundice.  No obvious rash, lesion, or ulcer.   LABORATORY PANEL:   CBC Recent Labs  Lab 03/14/24 1313  WBC 18.2*  HGB 8.8*  HCT 25.3*  PLT 167   ------------------------------------------------------------------------------------------------------------------  Chemistries  Recent Labs  Lab 03/14/24 1313  NA 134*  K 3.0*  CL 105  CO2 18*  GLUCOSE 102*  BUN 6*  CREATININE 0.78  CALCIUM  8.6*  AST 121*  ALT 53*  ALKPHOS 278*  BILITOT 18.5*   ------------------------------------------------------------------------------------------------------------------  Cardiac Enzymes No  results for input(s): TROPONINI in the last 168 hours. ------------------------------------------------------------------------------------------------------------------  RADIOLOGY:  MR ABDOMEN MRCP W WO CONTAST Result Date: 03/14/2024 CLINICAL DATA:  Known history of colon and pancreatic cancer with worsening abdominal pain, constipation EXAM: MRI ABDOMEN WITHOUT AND WITH CONTRAST (INCLUDING MRCP) TECHNIQUE: Multiplanar multisequence MR imaging of the abdomen was performed both before and after the administration of intravenous contrast. Heavily T2-weighted images of the biliary and pancreatic ducts were obtained, and three-dimensional MRCP images were rendered by post processing. CONTRAST:  9mL GADAVIST GADOBUTROL 1 MMOL/ML IV SOLN COMPARISON:  Earlier same day CT abdomen and pelvis, MRI abdomen dated 03/04/2024 FINDINGS: Lower chest: No acute findings. Hepatobiliary: Left hepatic atrophy. No focal hepatic lesions. Similar severe intrahepatic bile duct dilation with increased abrupt caliber narrowing of the hepatic duct at the confluence with the cystic duct (14:39). Persistent distended gallbladder now demonstrates circumferential mural thickening. Pancreas: Unchanged severely dilated and tortuous main pancreatic duct  upstream of an ill-defined, expansile mass in the pancreatic head and neck measuring approximately 4.1 x 2.5 cm (19:49), not substantially changed from 03/04/2024. Spleen:  Within normal limits in size and appearance. Adrenals/Urinary Tract: No adrenal nodules. No suspicious renal masses identified. No evidence of hydronephrosis. Bilateral simple cysts. Stomach/Bowel: Markedly distended stomach as seen on earlier same day CT. Vascular/Lymphatic: No pathologically enlarged lymph nodes identified. No abdominal aortic aneurysm demonstrated. Vascular involvement, as before. Other: Small volume ascites and diffuse body wall edema, increased from 03/04/2024. Musculoskeletal: No suspicious bone  lesions identified. IMPRESSION: 1. Similar severe intrahepatic bile duct dilation with increased abrupt caliber narrowing of the hepatic duct at the confluence with the cystic duct. 2. Persistent distended gallbladder now demonstrates circumferential mural thickening, which may be reactive or reflect acute cholecystitis. 3. Unchanged severely dilated and tortuous main pancreatic duct upstream of an ill-defined, expansile mass in the pancreatic head and neck measuring approximately 4.1 x 2.5 cm, not substantially changed from 03/04/2024. 4. Markedly distended stomach as seen on earlier same day CT. 5. Small volume ascites and diffuse body wall edema, increased from 03/04/2024. Electronically Signed   By: Limin  Xu M.D.   On: 03/14/2024 20:38   MR 3D Recon At Scanner Result Date: 03/14/2024 CLINICAL DATA:  Known history of colon and pancreatic cancer with worsening abdominal pain, constipation EXAM: MRI ABDOMEN WITHOUT AND WITH CONTRAST (INCLUDING MRCP) TECHNIQUE: Multiplanar multisequence MR imaging of the abdomen was performed both before and after the administration of intravenous contrast. Heavily T2-weighted images of the biliary and pancreatic ducts were obtained, and three-dimensional MRCP images were rendered by post processing. CONTRAST:  9mL GADAVIST GADOBUTROL 1 MMOL/ML IV SOLN COMPARISON:  Earlier same day CT abdomen and pelvis, MRI abdomen dated 03/04/2024 FINDINGS: Lower chest: No acute findings. Hepatobiliary: Left hepatic atrophy. No focal hepatic lesions. Similar severe intrahepatic bile duct dilation with increased abrupt caliber narrowing of the hepatic duct at the confluence with the cystic duct (14:39). Persistent distended gallbladder now demonstrates circumferential mural thickening. Pancreas: Unchanged severely dilated and tortuous main pancreatic duct upstream of an ill-defined, expansile mass in the pancreatic head and neck measuring approximately 4.1 x 2.5 cm (19:49), not substantially  changed from 03/04/2024. Spleen:  Within normal limits in size and appearance. Adrenals/Urinary Tract: No adrenal nodules. No suspicious renal masses identified. No evidence of hydronephrosis. Bilateral simple cysts. Stomach/Bowel: Markedly distended stomach as seen on earlier same day CT. Vascular/Lymphatic: No pathologically enlarged lymph nodes identified. No abdominal aortic aneurysm demonstrated. Vascular involvement, as before. Other: Small volume ascites and diffuse body wall edema, increased from 03/04/2024. Musculoskeletal: No suspicious bone lesions identified. IMPRESSION: 1. Similar severe intrahepatic bile duct dilation with increased abrupt caliber narrowing of the hepatic duct at the confluence with the cystic duct. 2. Persistent distended gallbladder now demonstrates circumferential mural thickening, which may be reactive or reflect acute cholecystitis. 3. Unchanged severely dilated and tortuous main pancreatic duct upstream of an ill-defined, expansile mass in the pancreatic head and neck measuring approximately 4.1 x 2.5 cm, not substantially changed from 03/04/2024. 4. Markedly distended stomach as seen on earlier same day CT. 5. Small volume ascites and diffuse body wall edema, increased from 03/04/2024. Electronically Signed   By: Limin  Xu M.D.   On: 03/14/2024 20:38   CT ABDOMEN PELVIS W CONTRAST Result Date: 03/14/2024 CLINICAL DATA:  Constipation, short of breath, bilateral lower extremity edema, history of colon cancer, recent stent placement for obstructive jaundice EXAM: CT ABDOMEN AND PELVIS WITH CONTRAST TECHNIQUE:  Multidetector CT imaging of the abdomen and pelvis was performed using the standard protocol following bolus administration of intravenous contrast. RADIATION DOSE REDUCTION: This exam was performed according to the departmental dose-optimization program which includes automated exposure control, adjustment of the mA and/or kV according to patient size and/or use of  iterative reconstruction technique. CONTRAST:  OMNIPAQUE  IOHEXOL  300 MG/ML  SOLN COMPARISON:  03/04/2024, 02/27/2024 FINDINGS: Lower chest: No acute pleural or parenchymal lung disease. Stable cystic changes in the right lower lobe with bibasilar bronchiectasis again noted. Hepatobiliary: There is progressive intrahepatic and extrahepatic biliary duct dilation despite interval placement of a biliary stent. The proximal aspect of the stent is within the right common hepatic duct, and distal aspect within the downstream common bile duct. The tip does not appear to extend into the lumen of the duodenum, and may not provide adequate decompression of the biliary tree. Marked distension of the gallbladder with no evidence of acute cholecystitis. No acute parenchymal liver abnormality. Pancreas: Complex cystic and solid mass at the junction of the head and body of the pancreas again noted, measuring approximately 3.1 x 2.7 cm, previously evaluated by MRI. There is upstream pancreatic duct dilation and atrophy of the parenchyma within the distal body and tail the pancreas, stable. No acute inflammatory changes. Spleen: Normal in size without focal abnormality. Adrenals/Urinary Tract: The adrenals are stable. Bilateral renal cortical and peripelvic cysts do not require specific imaging follow-up. The kidneys enhance normally. Bladder is unremarkable. Stomach/Bowel: No bowel obstruction or ileus. Normal appendix right lower quadrant. Postsurgical changes from previous partial colon resection. No bowel wall thickening or acute inflammatory changes. Distended fluid-filled stomach is noted, consideration could be given to decompression with enteric catheter. Vascular/Lymphatic: Aortic atherosclerosis. 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 No enlarged abdominal or pelvic lymph nodes. Reproductive: Stable enlargement  of the prostate. Other: Trace ascites most pronounced in the right upper quadrant and left paracolic gutter. No free intraperitoneal gas. No abdominal wall hernia. Musculoskeletal: No acute or destructive bony abnormalities. Reconstructed images demonstrate no additional findings. IMPRESSION: 1. Progressive intrahepatic and extrahepatic biliary duct dilation despite interval placement of bile duct stent. The downstream aspect of the biliary stent projects near the ampulla not clearly within the lumen of the duodenum, and may not provide adequate decompression of the biliary tree. 2. Marked gallbladder distension without evidence of cholelithiasis or acute cholecystitis. 3. Stable complex cystic and solid mass within the pancreatic body, with continued extrinsic compression upon the portal vein, upstream pancreatic duct dilation, and atrophy of the pancreatic body and tail. This has been previously evaluated by MRI. 4. Distended fluid-filled stomach, which may require decompression with enteric catheter. No evidence of bowel obstruction or ileus. 5. Trace ascites. 6.  Aortic Atherosclerosis (ICD10-I70.0). Electronically Signed   By: Ozell Daring M.D.   On: 03/14/2024 16:04   DG Chest Portable 1 View Result Date: 03/14/2024 EXAM: 1 VIEW(S) XRAY OF THE CHEST 03/14/2024 01:09:00 PM COMPARISON: None available. CLINICAL HISTORY: sob FINDINGS: LINES, TUBES AND DEVICES: Right IJ chest port in place with tip terminating in right atrium. LUNGS AND PLEURA: Low lung volumes. Bibasilar airspace opacities. No pulmonary edema. No pleural effusion. No pneumothorax. HEART AND MEDIASTINUM: Atherosclerotic calcifications. No acute abnormality of the cardiac and mediastinal silhouettes. BONES AND SOFT TISSUES: Degenerative changes of the shoulders. No acute osseous abnormality. IMPRESSION: 1. Low lung volumes with bibasilar airspace  opacities, which may reflect atelectasis or pneumonia. Electronically signed by: Norleen Boxer MD  03/14/2024 01:48 PM EDT RP Workstation: [id number]      IMPRESSION AND PLAN:  Assessment and Plan: * Obstructive jaundice (HCC) - This is likely secondary to biliary obstruction that could be related to malfunctioning/displaced/inadequate biliary stent. - He will be admitted to a progressive unit bed. - Given associated manifestations of sepsis with leukocytosis and tachypnea and mild hypotension, will empirically place him on IV vancomycin , cefepime  and Flagyl . - Will continue colestyramine. - GI consultation will be obtained. - Dr. Therisa was notified and is aware about the patient.  Dr. Jinny will be available for ERCP on Monday. - IR was notified about the patient in case he needs percutaneous biliary drainage during the weekend.  Consult can also be obtained for worsening hyperbilirubinemia. - His family was pleased about being admitted here.   - Will follow blood cultures. - His urinalysis showed 11-20 WBCs with rare bacteria and negative nitrites.  Hypokalemia - We will replace potassium.  Pancreatic cancer (HCC) - Pain management will be provided.  This is clearly the culprit for his biliary obstruction  BPH (benign prostatic hyperplasia) - Will continue Flomax .  GERD without esophagitis - Will continue PPI therapy.   DVT prophylaxis: Lovenox .  Advanced Care Planning:  Code Status: The patient is DNR/DNI. Family Communication:  The plan of care was discussed in details with the patient (and family). I answered all questions. The patient agreed to proceed with the above mentioned plan. Further management will depend upon hospital course. Disposition Plan: Back to previous home environment Consults called: GI All the records are reviewed and case discussed with ED provider.  Status is: Inpatient    At the time of the admission, it appears that the appropriate admission status for this patient is inpatient.  This is judged to be reasonable and necessary in order to  provide the required intensity of service to ensure the patient's safety given the presenting symptoms, physical exam findings and initial radiographic and laboratory data in the context of comorbid conditions.  The patient requires inpatient status due to high intensity of service, high risk of further deterioration and high frequency of surveillance required.  I certify that at the time of admission, it is my clinical judgment that the patient will require inpatient hospital care extending more than 2 midnights.                            Dispo: The patient is from: Home              Anticipated d/c is to: Home              Patient currently is not medically stable to d/c.              Difficult to place patient: No  Madison DELENA Peaches M.D on 03/15/2024 at 6:44 AM  [facility]   From 7 PM-7 AM, contact night-coverage [email or web address]  CC: Primary care physician; Sadie Manna, MD

## 2024-03-14 NOTE — ED Triage Notes (Addendum)
 Patient to ED via [facility] from home. Patient complaining of constipation and shortness of breath. Patient states his LBM was 2 days ago. Patient has bilateral leg pitting edema. Patient has dyspnea while resting but current sats are 100% on room air. Patient had a stent placed 1.5 weeks ago for an obstructed bile duct. Patient is not currently on blood thinners. Patient is jaundiced.

## 2024-03-14 NOTE — ED Notes (Signed)
 Informed dr adine family at bedside states patient is a DNR with paperwork in their possesion.

## 2024-03-15 ENCOUNTER — Encounter: Payer: Self-pay | Admitting: Family Medicine

## 2024-03-15 ENCOUNTER — Encounter
Admission: EM | Disposition: A | Discharge status: Hospice | Payer: Self-pay | Source: Home / Self Care | Attending: Pulmonary Disease

## 2024-03-15 ENCOUNTER — Inpatient Hospital Stay

## 2024-03-15 ENCOUNTER — Inpatient Hospital Stay: Admitting: Anesthesiology

## 2024-03-15 DIAGNOSIS — K219 Gastro-esophageal reflux disease without esophagitis: Secondary | ICD-10-CM | POA: Insufficient documentation

## 2024-03-15 DIAGNOSIS — K831 Obstruction of bile duct: Secondary | ICD-10-CM | POA: Diagnosis not present

## 2024-03-15 DIAGNOSIS — Z4659 Encounter for fitting and adjustment of other gastrointestinal appliance and device: Secondary | ICD-10-CM

## 2024-03-15 DIAGNOSIS — K8309 Other cholangitis: Secondary | ICD-10-CM

## 2024-03-15 DIAGNOSIS — C259 Malignant neoplasm of pancreas, unspecified: Secondary | ICD-10-CM

## 2024-03-15 HISTORY — PX: ERCP: SHX5425

## 2024-03-15 HISTORY — PX: BILIARY STENT PLACEMENT: SHX5538

## 2024-03-15 HISTORY — PX: STENT REMOVAL: SHX6421

## 2024-03-15 LAB — COMPREHENSIVE METABOLIC PANEL WITH GFR
ALT: 68 U/L — ABNORMAL HIGH (ref 0–44)
ALT: 89 U/L — ABNORMAL HIGH (ref 0–44)
AST: 168 U/L — ABNORMAL HIGH (ref 15–41)
AST: 249 U/L — ABNORMAL HIGH (ref 15–41)
Albumin: 2 g/dL — ABNORMAL LOW (ref 3.5–5.0)
Albumin: 1.9 g/dL — ABNORMAL LOW (ref 3.5–5.0)
Alkaline Phosphatase: 241 U/L — ABNORMAL HIGH (ref 38–126)
Alkaline Phosphatase: 183 U/L — ABNORMAL HIGH (ref 38–126)
Anion gap: 17 — ABNORMAL HIGH (ref 5–15)
Anion gap: 12 (ref 5–15)
BUN: 11 mg/dL (ref 8–23)
BUN: 15 mg/dL (ref 8–23)
CO2: 17 mmol/L — ABNORMAL LOW (ref 22–32)
CO2: 16 mmol/L — ABNORMAL LOW (ref 22–32)
Calcium: 9 mg/dL (ref 8.9–10.3)
Calcium: 8.3 mg/dL — ABNORMAL LOW (ref 8.9–10.3)
Chloride: 104 mmol/L (ref 98–111)
Chloride: 109 mmol/L (ref 98–111)
Creatinine, Ser: 0.73 mg/dL (ref 0.61–1.24)
Creatinine, Ser: 1.31 mg/dL — ABNORMAL HIGH (ref 0.61–1.24)
GFR, Estimated: >60 mL/min (ref >60)
GFR, Estimated: 54 mL/min — ABNORMAL LOW (ref >60)
Glucose, Bld: 71 mg/dL (ref 70–99)
Glucose, Bld: 118 mg/dL — ABNORMAL HIGH (ref 70–99)
Potassium: 3 mmol/L — ABNORMAL LOW (ref 3.5–5.1)
Potassium: 3.6 mmol/L (ref 3.5–5.1)
Sodium: 138 mmol/L (ref 135–145)
Sodium: 137 mmol/L (ref 135–145)
Total Bilirubin: 18.6 mg/dL (ref 0.0–1.2)
Total Bilirubin: 16.2 mg/dL — ABNORMAL HIGH (ref 0.0–1.2)
Total Protein: 5.5 g/dL — ABNORMAL LOW (ref 6.5–8.1)
Total Protein: 4.9 g/dL — ABNORMAL LOW (ref 6.5–8.1)

## 2024-03-15 LAB — CBC
HCT: 28.9 % — ABNORMAL LOW (ref 39.0–52.0)
HCT: 29.5 % — ABNORMAL LOW (ref 39.0–52.0)
Hemoglobin: 10 g/dL — ABNORMAL LOW (ref 13.0–17.0)
Hemoglobin: 10.3 g/dL — ABNORMAL LOW (ref 13.0–17.0)
MCH: 32.4 pg (ref 26.0–34.0)
MCH: 32.7 pg (ref 26.0–34.0)
MCHC: 34.6 g/dL (ref 30.0–36.0)
MCHC: 34.9 g/dL (ref 30.0–36.0)
MCV: 93.5 fL (ref 80.0–100.0)
MCV: 93.7 fL (ref 80.0–100.0)
Platelets: 165 K/uL (ref 150–400)
Platelets: 185 K/uL (ref 150–400)
RBC: 3.09 MIL/uL — ABNORMAL LOW (ref 4.22–5.81)
RBC: 3.15 MIL/uL — ABNORMAL LOW (ref 4.22–5.81)
RDW: 17.1 % — ABNORMAL HIGH (ref 11.5–15.5)
RDW: 17.2 % — ABNORMAL HIGH (ref 11.5–15.5)
WBC: 22.4 K/uL — ABNORMAL HIGH (ref 4.0–10.5)
WBC: 25.8 K/uL — ABNORMAL HIGH (ref 4.0–10.5)
nRBC: 0 % (ref 0.0–0.2)
nRBC: 0.1 % (ref 0.0–0.2)

## 2024-03-15 LAB — ABO/RH: ABO/RH(D): AB POS

## 2024-03-15 LAB — PROTIME-INR
INR: 4.5 (ref 0.8–1.2)
Prothrombin Time: 44.4 s — ABNORMAL HIGH (ref 11.4–15.2)

## 2024-03-15 LAB — GLUCOSE, CAPILLARY
Glucose-Capillary: 20 mg/dL — CL (ref 70–99)
Glucose-Capillary: 78 mg/dL (ref 70–99)
Glucose-Capillary: 55 mg/dL — ABNORMAL LOW (ref 70–99)
Glucose-Capillary: 113 mg/dL — ABNORMAL HIGH (ref 70–99)

## 2024-03-15 LAB — MRSA NEXT GEN BY PCR, NASAL: MRSA by PCR Next Gen: NOT DETECTED

## 2024-03-15 LAB — CORTISOL-AM, BLOOD: Cortisol - AM: 40.1 ug/dL — ABNORMAL HIGH (ref 6.7–22.6)

## 2024-03-15 LAB — LACTIC ACID, PLASMA: Lactic Acid, Venous: 2.6 mmol/L (ref 0.5–1.9)

## 2024-03-15 SURGERY — ERCP, WITH INTERVENTION IF INDICATED
Anesthesia: General

## 2024-03-15 MED ORDER — VANCOMYCIN HCL 2000 MG/400ML IV SOLN
2000.0000 mg | Freq: Once | INTRAVENOUS | Status: AC
  Administered 2024-03-15: 2000 mg via INTRAVENOUS
  Filled 2024-03-15: qty 400

## 2024-03-15 MED ORDER — ONDANSETRON HCL 4 MG/2ML IJ SOLN
4.0000 mg | Freq: Four times a day (QID) | INTRAMUSCULAR | Status: DC | PRN
  Administered 2024-03-15 (×2): 4 mg via INTRAVENOUS
  Filled 2024-03-15 (×2): qty 2

## 2024-03-15 MED ORDER — VASOPRESSIN 20 UNIT/ML IV SOLN
INTRAVENOUS | Status: DC | PRN
  Administered 2024-03-15 (×2): 4 [IU] via INTRAVENOUS

## 2024-03-15 MED ORDER — SODIUM CHLORIDE 0.9% IV SOLUTION: Freq: Once | INTRAVENOUS | Status: AC

## 2024-03-15 MED ORDER — TRAZODONE HCL 50 MG PO TABS
25.0000 mg | ORAL_TABLET | Freq: Every evening | ORAL | Status: DC | PRN
Start: 2024-03-15 — End: 2024-03-17

## 2024-03-15 MED ORDER — FENTANYL CITRATE (PF) 100 MCG/2ML IJ SOLN
INTRAMUSCULAR | Status: AC
  Filled 2024-03-15: qty 2

## 2024-03-15 MED ORDER — MIDODRINE HCL 5 MG PO TABS
10.0000 mg | ORAL_TABLET | Freq: Three times a day (TID) | ORAL | Status: DC
  Administered 2024-03-16 (×2): 10 mg via ORAL
  Filled 2024-03-15 (×2): qty 2

## 2024-03-15 MED ORDER — ACETAMINOPHEN 650 MG RE SUPP: 650.0000 mg | Freq: Four times a day (QID) | RECTAL | Status: DC | PRN

## 2024-03-15 MED ORDER — SODIUM CHLORIDE 0.9% FLUSH
10.0000 mL | Freq: Two times a day (BID) | INTRAVENOUS | Status: DC
  Administered 2024-03-15 – 2024-03-16 (×3): 10 mL
  Administered 2024-03-17: 40 mL
  Administered 2024-03-17 – 2024-03-19 (×4): 10 mL
  Administered 2024-03-19 – 2024-03-20 (×2): 20 mL

## 2024-03-15 MED ORDER — ONDANSETRON HCL 4 MG PO TABS: 8.0000 mg | ORAL_TABLET | Freq: Two times a day (BID) | ORAL | Status: DC | PRN

## 2024-03-15 MED ORDER — LACTATED RINGERS IV SOLN: INTRAVENOUS | Status: DC

## 2024-03-15 MED ORDER — LORATADINE 10 MG PO TABS
10.0000 mg | ORAL_TABLET | Freq: Every day | ORAL | Status: DC
  Administered 2024-03-15 – 2024-03-16 (×2): 10 mg via ORAL
  Filled 2024-03-15 (×2): qty 1

## 2024-03-15 MED ORDER — SUCCINYLCHOLINE CHLORIDE 200 MG/10ML IV SOSY
PREFILLED_SYRINGE | INTRAVENOUS | Status: DC | PRN
  Administered 2024-03-15: 100 mg via INTRAVENOUS

## 2024-03-15 MED ORDER — POTASSIUM CHLORIDE CRYS ER 20 MEQ PO TBCR
20.0000 meq | EXTENDED_RELEASE_TABLET | Freq: Every day | ORAL | Status: DC
  Administered 2024-03-15 – 2024-03-16 (×2): 20 meq via ORAL
  Filled 2024-03-15 (×2): qty 1

## 2024-03-15 MED ORDER — CHLORHEXIDINE GLUCONATE CLOTH 2 % EX PADS
6.0000 | MEDICATED_PAD | Freq: Every day | CUTANEOUS | Status: DC
  Administered 2024-03-15 – 2024-03-20 (×6): 6 via TOPICAL

## 2024-03-15 MED ORDER — METRONIDAZOLE 500 MG/100ML IV SOLN
500.0000 mg | Freq: Two times a day (BID) | INTRAVENOUS | Status: DC
  Administered 2024-03-15 – 2024-03-20 (×11): 500 mg via INTRAVENOUS
  Filled 2024-03-15 (×12): qty 100

## 2024-03-15 MED ORDER — PHENYLEPHRINE 80 MCG/ML (10ML) SYRINGE FOR IV PUSH (FOR BLOOD PRESSURE SUPPORT)
PREFILLED_SYRINGE | INTRAVENOUS | Status: DC | PRN
  Administered 2024-03-15 (×3): 160 ug via INTRAVENOUS

## 2024-03-15 MED ORDER — SODIUM CHLORIDE 0.9 % IV SOLN
2.0000 g | INTRAVENOUS | Status: DC
  Administered 2024-03-15: 2 g via INTRAVENOUS
  Filled 2024-03-15: qty 20

## 2024-03-15 MED ORDER — ENOXAPARIN SODIUM 60 MG/0.6ML IJ SOSY
45.0000 mg | PREFILLED_SYRINGE | INTRAMUSCULAR | Status: DC
  Administered 2024-03-15: 45 mg via SUBCUTANEOUS
  Filled 2024-03-15: qty 0.6

## 2024-03-15 MED ORDER — LIDOCAINE HCL (CARDIAC) PF 100 MG/5ML IV SOSY
PREFILLED_SYRINGE | INTRAVENOUS | Status: DC | PRN
Start: 2024-03-15 — End: 2024-03-15
  Administered 2024-03-15: 80 mg via INTRAVENOUS

## 2024-03-15 MED ORDER — DEXTROSE 50 % IV SOLN
INTRAVENOUS | Status: DC | PRN
Start: 2024-03-15 — End: 2024-03-15
  Administered 2024-03-15: 25 g via INTRAVENOUS

## 2024-03-15 MED ORDER — VITAMIN K1 10 MG/ML IJ SOLN
10.0000 mg | Freq: Every day | INTRAVENOUS | Status: AC
  Administered 2024-03-16 – 2024-03-17 (×2): 10 mg via INTRAVENOUS
  Filled 2024-03-15 (×2): qty 1

## 2024-03-15 MED ORDER — MORPHINE SULFATE (PF) 2 MG/ML IV SOLN: 0.5000 mg | Freq: Four times a day (QID) | INTRAVENOUS | Status: DC | PRN

## 2024-03-15 MED ORDER — PROPOFOL 10 MG/ML IV BOLUS
INTRAVENOUS | Status: AC
  Filled 2024-03-15: qty 20

## 2024-03-15 MED ORDER — SODIUM CHLORIDE 0.9 % IV SOLN
2.0000 g | Freq: Three times a day (TID) | INTRAVENOUS | Status: DC
  Administered 2024-03-15: 2 g via INTRAVENOUS
  Filled 2024-03-15: qty 12.5

## 2024-03-15 MED ORDER — DEXTROSE 50 % IV SOLN
INTRAVENOUS | Status: AC
  Filled 2024-03-15: qty 50

## 2024-03-15 MED ORDER — SODIUM CHLORIDE 0.9% IV SOLUTION: Freq: Once | INTRAVENOUS | Status: DC

## 2024-03-15 MED ORDER — MAGNESIUM HYDROXIDE 400 MG/5ML PO SUSP
30.0000 mL | Freq: Every day | ORAL | Status: DC | PRN
  Filled 2024-03-15: qty 30

## 2024-03-15 MED ORDER — SODIUM CHLORIDE 0.9 % IV BOLUS
500.0000 mL | Freq: Once | INTRAVENOUS | Status: AC
  Administered 2024-03-16: 500 mL via INTRAVENOUS

## 2024-03-15 MED ORDER — EPINEPHRINE 1 MG/10ML IV SOSY
PREFILLED_SYRINGE | INTRAVENOUS | Status: DC | PRN
  Administered 2024-03-15 (×2): 10 ug via INTRAVENOUS
  Administered 2024-03-15: 20 ug via INTRAVENOUS
  Administered 2024-03-15 (×6): 10 ug via INTRAVENOUS

## 2024-03-15 MED ORDER — CHOLESTYRAMINE 4 G PO PACK: 4.0000 g | PACK | Freq: Three times a day (TID) | ORAL | Status: DC

## 2024-03-15 MED ORDER — VANCOMYCIN HCL IN DEXTROSE 1-5 GM/200ML-% IV SOLN: 1000.0000 mg | Freq: Two times a day (BID) | INTRAVENOUS | Status: DC

## 2024-03-15 MED ORDER — VANCOMYCIN HCL IN DEXTROSE 1-5 GM/200ML-% IV SOLN: 1000.0000 mg | Freq: Once | INTRAVENOUS | Status: DC

## 2024-03-15 MED ORDER — PANTOPRAZOLE SODIUM 40 MG PO TBEC
40.0000 mg | DELAYED_RELEASE_TABLET | Freq: Every morning | ORAL | Status: DC
  Administered 2024-03-15 – 2024-03-16 (×2): 40 mg via ORAL
  Filled 2024-03-15 (×2): qty 1

## 2024-03-15 MED ORDER — SODIUM CHLORIDE 0.9 % IV BOLUS
500.0000 mL | Freq: Once | INTRAVENOUS | Status: AC
  Administered 2024-03-15: 500 mL via INTRAVENOUS

## 2024-03-15 MED ORDER — ACETAMINOPHEN 325 MG PO TABS: 650.0000 mg | ORAL_TABLET | Freq: Four times a day (QID) | ORAL | Status: DC | PRN

## 2024-03-15 MED ORDER — DEXTROSE 50 % IV SOLN
25.0000 g | Freq: Once | INTRAVENOUS | Status: AC
  Administered 2024-03-15: 25 g via INTRAVENOUS

## 2024-03-15 MED ORDER — INDOMETHACIN 50 MG RE SUPP
100.0000 mg | Freq: Once | RECTAL | Status: DC
  Filled 2024-03-15: qty 2

## 2024-03-15 MED ORDER — SODIUM CHLORIDE 0.9 % IV SOLN: 2.0000 g | Freq: Once | INTRAVENOUS | Status: DC

## 2024-03-15 MED ORDER — DEXTROSE IN LACTATED RINGERS 5 % IV SOLN
INTRAVENOUS | Status: DC
Start: 2024-03-15 — End: 2024-03-15

## 2024-03-15 MED ORDER — FENTANYL CITRATE (PF) 100 MCG/2ML IJ SOLN
INTRAMUSCULAR | Status: DC | PRN
  Administered 2024-03-15: 50 ug via INTRAVENOUS

## 2024-03-15 MED ORDER — DICLOFENAC SUPPOSITORY 100 MG
RECTAL | Status: AC
  Filled 2024-03-15: qty 1

## 2024-03-15 MED ORDER — LACTATED RINGERS IV BOLUS
250.0000 mL | Freq: Once | INTRAVENOUS | Status: DC
Start: 2024-03-15 — End: 2024-03-16

## 2024-03-15 MED ORDER — PANTOPRAZOLE SODIUM 40 MG PO TBEC: 40.0000 mg | DELAYED_RELEASE_TABLET | Freq: Every day | ORAL | Status: DC

## 2024-03-15 MED ORDER — CHOLESTYRAMINE 4 G PO PACK
4.0000 g | PACK | Freq: Three times a day (TID) | ORAL | Status: DC
Start: 2024-03-15 — End: 2024-03-20
  Administered 2024-03-15 – 2024-03-16 (×2): 4 g via ORAL
  Filled 2024-03-15 (×5): qty 1

## 2024-03-15 MED ORDER — SODIUM CHLORIDE 0.9 % IV SOLN: INTRAVENOUS | Status: DC | PRN

## 2024-03-15 MED ORDER — LACTATED RINGERS IV SOLN
150.0000 mL/h | INTRAVENOUS | Status: DC
  Administered 2024-03-15: 150 mL/h via INTRAVENOUS

## 2024-03-15 MED ORDER — ONDANSETRON HCL 4 MG PO TABS: 4.0000 mg | ORAL_TABLET | Freq: Four times a day (QID) | ORAL | Status: DC | PRN

## 2024-03-15 MED ORDER — VITAMIN K1 10 MG/ML IJ SOLN
10.0000 mg | Freq: Once | INTRAVENOUS | Status: AC
  Administered 2024-03-15: 10 mg via INTRAVENOUS
  Filled 2024-03-15: qty 1

## 2024-03-15 MED ORDER — SODIUM CHLORIDE 0.9% FLUSH: 10.0000 mL | INTRAVENOUS | Status: DC | PRN

## 2024-03-15 MED ORDER — ENSURE PLUS HIGH PROTEIN PO LIQD
237.0000 mL | Freq: Two times a day (BID) | ORAL | Status: DC
  Administered 2024-03-15 – 2024-03-17 (×2): 237 mL via ORAL

## 2024-03-15 MED ORDER — FUROSEMIDE 20 MG PO TABS: 20.0000 mg | ORAL_TABLET | Freq: Every day | ORAL | Status: DC | PRN

## 2024-03-15 MED ORDER — PROPOFOL 10 MG/ML IV BOLUS
INTRAVENOUS | Status: DC | PRN
  Administered 2024-03-15: 100 mg via INTRAVENOUS

## 2024-03-15 MED ORDER — DEXTROSE IN LACTATED RINGERS 5 % IV SOLN: INTRAVENOUS | Status: AC

## 2024-03-15 MED ORDER — VITAMIN B-12 100 MCG PO TABS
500.0000 ug | ORAL_TABLET | Freq: Every day | ORAL | Status: DC
  Administered 2024-03-15 – 2024-03-16 (×2): 500 ug via ORAL
  Filled 2024-03-15: qty 5
  Filled 2024-03-15: qty 1

## 2024-03-15 MED ORDER — DICLOFENAC SUPPOSITORY 100 MG
100.0000 mg | Freq: Once | RECTAL | Status: AC
  Administered 2024-03-15: 100 mg via RECTAL
  Filled 2024-03-15: qty 1

## 2024-03-15 NOTE — Op Note (Signed)
 [facility] Gastroenterology Patient Name: Jared Gutierrez Procedure Date: 03/15/2024 7:06 PM MRN: [id number] Account #: [id number] Date of Birth: 23-Jul-1940 Admit Type: Outpatient Age: 83 Room: [facility] ENDO ROOM 4 Gender: Male Note Status: Finalized Instrument Name: CELINDA [id number] Procedure:             ERCP Indications:           Suspected ascending cholangitis Providers:             Rogelia Copping MD, MD Medicines:             General Anesthesia Complications:         No immediate complications. Procedure:             Pre-Anesthesia Assessment:                        - Prior to the procedure, a History and Physical was                         performed, and patient medications and allergies were                         reviewed. The patient's tolerance of previous                         anesthesia was also reviewed. The risks and benefits                         of the procedure and the sedation options and risks                         were discussed with the patient. All questions were                         answered, and informed consent was obtained. Prior                         Anticoagulants: The patient has taken no anticoagulant                         or antiplatelet agents. ASA Grade Assessment: III - A                         patient with severe systemic disease. After reviewing                         the risks and benefits, the patient was deemed in                         satisfactory condition to undergo the procedure.                        After obtaining informed consent, the scope was passed                         under direct vision. Throughout the procedure, the                         patient's blood pressure, pulse, and  oxygen                         saturations were monitored continuously. The                         Duodenoscope was introduced through the mouth, and                         used to inject contrast into and used to inject                          contrast into the bile duct. The ERCP was technically                         difficult and complex due to poor endoscopic                         visualization. The patient tolerated the procedure                         well. Findings:      A biliary stent was visible on the scout film. The esophagus was       successfully intubated under direct vision. The scope was advanced to a       normal major papilla in the descending duodenum without detailed       examination of the pharynx, larynx and associated structures, and upper       GI tract. The upper GI tract was grossly normal. One stent was removed       from the common bile duct using a snare. The bile duct was deeply       cannulated with the short-nosed traction sphincterotome. Contrast was       injected. I personally interpreted the bile duct images. There was brisk       flow of contrast through the ducts. Image quality was excellent.       Contrast extended to the entire biliary tree. The main bile duct       contained multiple segmental stenoses. A wire was passed into the       biliary tree. One 7 Fr by 9 cm plastic stent with a single external flap       and a single internal flap was placed 8 cm into the common bile duct.       Bile flowed through the stent. The stent was in good position. 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 The strictures were                         malignant appearing.                        - One stent was removed from the common bile duct.                        - One plastic stent was placed into the common bile  duct. Recommendation:        - Return patient to ICU for ongoing care. Procedure Code(s):     --- Professional ---                        [phone], Endoscopic retrograde cholangiopancreatography                         (ERCP); with removal and exchange of  stent(s), biliary                         or pancreatic duct, including pre- and post-dilation                         and guide wire passage, when performed, including                         sphincterotomy, when performed, each stent exchanged                        [phone], Endoscopic catheterization of the biliary                         ductal system, radiological supervision and                         interpretation Diagnosis Code(s):     --- Professional ---                        K83.1, Obstruction of bile duct                        Z46.59, Encounter for fitting and adjustment of other                         gastrointestinal appliance and device CPT copyright 2022 [employer]. All rights reserved. The codes documented in this report are preliminary and upon coder review may  be revised to meet current compliance requirements. Rogelia Copping MD, MD 03/15/2024 8:50:08 PM This report has been signed electronically. Number of Addenda: 0 Note Initiated On: 03/15/2024 7:06 PM Estimated Blood Loss:  Estimated blood loss: none.      [facility]

## 2024-03-15 NOTE — Progress Notes (Signed)
 PHARMACIST - PHYSICIAN COMMUNICATION  CONCERNING:  Enoxaparin  (Lovenox ) for DVT Prophylaxis    RECOMMENDATION: Patient was prescribed enoxaprin 40mg  q24 hours for VTE prophylaxis.   Filed Weights   03/14/24 1217  Weight: 92.1 kg (203 lb)    Body mass index is 30.87 kg/m.  Estimated Creatinine Clearance: 77.1 mL/min (by C-G formula based on SCr of 0.78 mg/dL).   Based on [facility] policy patient is candidate for enoxaparin  0.5mg /kg TBW SQ every 24 hours based on BMI being >30.  DESCRIPTION: Pharmacy has adjusted enoxaparin  dose per [facility] policy.  Patient is now receiving enoxaparin  0.5 mg/kg every 24 hours   Rankin CANDIE Dills, PharmD, [facility] 03/15/2024 2:11 AM

## 2024-03-15 NOTE — ED Notes (Signed)
 CCMD called and verified that patient is currently on continuous monitoring

## 2024-03-15 NOTE — Progress Notes (Signed)
 Patient transferred to endo with Geni, RN, and Mliss SQUIBB., RN, pt in no distress, vitals stable, and on 100% on room air. Patient left in procedure room with nurse. Family in waiting room.

## 2024-03-15 NOTE — Progress Notes (Addendum)
 [facility]  - [place] [facility]   PATIENT NAME: Jared Gutierrez    MR#:  [id number]  DATE OF BIRTH:  1941-03-16  SUBJECTIVE:  Wife and dter at bedside Nausea, vomited earlieri diffuse abd discomfort   VITALS:  Blood pressure 95/60, pulse 96, temperature 97.8 F (36.6 C), temperature source Axillary, resp. rate 20, height 5' 8 (1.727 m), weight 92.1 kg, SpO2 100%.  PHYSICAL EXAMINATION:   GENERAL:  83 y.o.-year-old patient with no acute distress. Clinically ill/sick appearing, jaundiced skin LUNGS: Normal breath sounds bilaterally, no wheezing CARDIOVASCULAR: S1, S2 normal. No murmur   ABDOMEN: tight ,distended. Diffusely tender  EXTREMITIES+edema b/l.    NEUROLOGIC: nonfocal  patient is alert and awake   LABORATORY PANEL:  CBC Recent Labs  Lab 03/15/24 0744  WBC 22.4*  HGB 10.0*  HCT 28.9*  PLT 165    Chemistries  Recent Labs  Lab 03/15/24 0744  NA 138  K 3.0*  CL 104  CO2 17*  GLUCOSE 71  BUN 11  CREATININE 0.73  CALCIUM  9.0  AST 168*  ALT 68*  ALKPHOS 241*  BILITOT 18.6*   Cardiac Enzymes No results for input(s): TROPONINI in the last 168 hours. RADIOLOGY:  MR ABDOMEN MRCP W WO CONTAST Result Date: 03/14/2024 CLINICAL DATA:  Known history of colon and pancreatic cancer with worsening abdominal pain, constipation EXAM: MRI ABDOMEN WITHOUT AND WITH CONTRAST (INCLUDING MRCP) TECHNIQUE: Multiplanar multisequence MR imaging of the abdomen was performed both before and after the administration of intravenous contrast. Heavily T2-weighted images of the biliary and pancreatic ducts were obtained, and three-dimensional MRCP images were rendered by post processing. CONTRAST:  9mL GADAVIST GADOBUTROL 1 MMOL/ML IV SOLN COMPARISON:  Earlier same day CT abdomen and pelvis, MRI abdomen dated 03/04/2024 FINDINGS: Lower chest: No acute findings. Hepatobiliary: Left hepatic atrophy. No focal hepatic lesions. Similar severe intrahepatic bile duct  dilation with increased abrupt caliber narrowing of the hepatic duct at the confluence with the cystic duct (14:39). Persistent distended gallbladder now demonstrates circumferential mural thickening. Pancreas: Unchanged severely dilated and tortuous main pancreatic duct upstream of an ill-defined, expansile mass in the pancreatic head and neck measuring approximately 4.1 x 2.5 cm (19:49), not substantially changed from 03/04/2024. Spleen:  Within normal limits in size and appearance. Adrenals/Urinary Tract: No adrenal nodules. No suspicious renal masses identified. No evidence of hydronephrosis. Bilateral simple cysts. Stomach/Bowel: Markedly distended stomach as seen on earlier same day CT. Vascular/Lymphatic: No pathologically enlarged lymph nodes identified. No abdominal aortic aneurysm demonstrated. Vascular involvement, as before. Other: Small volume ascites and diffuse body wall edema, increased from 03/04/2024. Musculoskeletal: No suspicious bone lesions identified. IMPRESSION: 1. Similar severe intrahepatic bile duct dilation with increased abrupt caliber narrowing of the hepatic duct at the confluence with the cystic duct. 2. Persistent distended gallbladder now demonstrates circumferential mural thickening, which may be reactive or reflect acute cholecystitis. 3. Unchanged severely dilated and tortuous main pancreatic duct upstream of an ill-defined, expansile mass in the pancreatic head and neck measuring approximately 4.1 x 2.5 cm, not substantially changed from 03/04/2024. 4. Markedly distended stomach as seen on earlier same day CT. 5. Small volume ascites and diffuse body wall edema, increased from 03/04/2024. Electronically Signed   By: Limin  Xu M.D.   On: 03/14/2024 20:38   MR 3D Recon At Scanner Result Date: 03/14/2024 CLINICAL DATA:  Known history of colon and pancreatic cancer with worsening abdominal pain, constipation EXAM: MRI ABDOMEN WITHOUT AND WITH CONTRAST (INCLUDING MRCP) TECHNIQUE:  Multiplanar multisequence MR imaging of the abdomen was performed both before and after the administration of intravenous contrast. Heavily T2-weighted images of the biliary and pancreatic ducts were obtained, and three-dimensional MRCP images were rendered by post processing. CONTRAST:  9mL GADAVIST GADOBUTROL 1 MMOL/ML IV SOLN COMPARISON:  Earlier same day CT abdomen and pelvis, MRI abdomen dated 03/04/2024 FINDINGS: Lower chest: No acute findings. Hepatobiliary: Left hepatic atrophy. No focal hepatic lesions. Similar severe intrahepatic bile duct dilation with increased abrupt caliber narrowing of the hepatic duct at the confluence with the cystic duct (14:39). Persistent distended gallbladder now demonstrates circumferential mural thickening. Pancreas: Unchanged severely dilated and tortuous main pancreatic duct upstream of an ill-defined, expansile mass in the pancreatic head and neck measuring approximately 4.1 x 2.5 cm (19:49), not substantially changed from 03/04/2024. Spleen:  Within normal limits in size and appearance. Adrenals/Urinary Tract: No adrenal nodules. No suspicious renal masses identified. No evidence of hydronephrosis. Bilateral simple cysts. Stomach/Bowel: Markedly distended stomach as seen on earlier same day CT. Vascular/Lymphatic: No pathologically enlarged lymph nodes identified. No abdominal aortic aneurysm demonstrated. Vascular involvement, as before. Other: Small volume ascites and diffuse body wall edema, increased from 03/04/2024. Musculoskeletal: No suspicious bone lesions identified. IMPRESSION: 1. Similar severe intrahepatic bile duct dilation with increased abrupt caliber narrowing of the hepatic duct at the confluence with the cystic duct. 2. Persistent distended gallbladder now demonstrates circumferential mural thickening, which may be reactive or reflect acute cholecystitis. 3. Unchanged severely dilated and tortuous main pancreatic duct upstream of an ill-defined, expansile  mass in the pancreatic head and neck measuring approximately 4.1 x 2.5 cm, not substantially changed from 03/04/2024. 4. Markedly distended stomach as seen on earlier same day CT. 5. Small volume ascites and diffuse body wall edema, increased from 03/04/2024. Electronically Signed   By: Limin  Xu M.D.   On: 03/14/2024 20:38   CT ABDOMEN PELVIS W CONTRAST Result Date: 03/14/2024 CLINICAL DATA:  Constipation, short of breath, bilateral lower extremity edema, history of colon cancer, recent stent placement for obstructive jaundice EXAM: CT ABDOMEN AND PELVIS WITH CONTRAST TECHNIQUE: Multidetector CT imaging of the abdomen and pelvis was performed using the standard protocol following bolus administration of intravenous contrast. RADIATION DOSE REDUCTION: This exam was performed according to the departmental dose-optimization program which includes automated exposure control, adjustment of the mA and/or kV according to patient size and/or use of iterative reconstruction technique. CONTRAST:  OMNIPAQUE  IOHEXOL  300 MG/ML  SOLN COMPARISON:  03/04/2024, 02/27/2024 FINDINGS: Lower chest: No acute pleural or parenchymal lung disease. Stable cystic changes in the right lower lobe with bibasilar bronchiectasis again noted. Hepatobiliary: There is progressive intrahepatic and extrahepatic biliary duct dilation despite interval placement of a biliary stent. The proximal aspect of the stent is within the right common hepatic duct, and distal aspect within the downstream common bile duct. The tip does not appear to extend into the lumen of the duodenum, and may not provide adequate decompression of the biliary tree. Marked distension of the gallbladder with no evidence of acute cholecystitis. No acute parenchymal liver abnormality. Pancreas: Complex cystic and solid mass at the junction of the head and body of the pancreas again noted, measuring approximately 3.1 x 2.7 cm, previously evaluated by MRI. There is upstream  pancreatic duct dilation and atrophy of the parenchyma within the distal body and tail the pancreas, stable. No acute inflammatory changes. Spleen: Normal in size without focal abnormality. Adrenals/Urinary Tract: The adrenals are stable. Bilateral renal cortical and peripelvic cysts  do not require specific imaging follow-up. The kidneys enhance normally. Bladder is unremarkable. Stomach/Bowel: No bowel obstruction or ileus. Normal appendix right lower quadrant. Postsurgical changes from previous partial colon resection. No bowel wall thickening or acute inflammatory changes. Distended fluid-filled stomach is noted, consideration could be given to decompression with enteric catheter. Vascular/Lymphatic: Aortic atherosclerosis. 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 No enlarged abdominal or pelvic lymph nodes. Reproductive: Stable enlargement of the prostate. Other: Trace ascites most pronounced in the right upper quadrant and left paracolic gutter. No free intraperitoneal gas. No abdominal wall hernia. Musculoskeletal: No acute or destructive bony abnormalities. Reconstructed images demonstrate no additional findings. IMPRESSION: 1. Progressive intrahepatic and extrahepatic biliary duct dilation despite interval placement of bile duct stent. The downstream aspect of the biliary stent projects near the ampulla not clearly within the lumen of the duodenum, and may not provide adequate decompression of the biliary tree. 2. Marked gallbladder distension without evidence of cholelithiasis or acute cholecystitis. 3. Stable complex cystic and solid mass within the pancreatic body, with continued extrinsic compression upon the portal vein, upstream pancreatic duct dilation, and atrophy of the pancreatic body and tail. This has been previously evaluated by MRI. 4. Distended fluid-filled stomach, which may require  decompression with enteric catheter. No evidence of bowel obstruction or ileus. 5. Trace ascites. 6.  Aortic Atherosclerosis (ICD10-I70.0). Electronically Signed   By: Ozell Daring M.D.   On: 03/14/2024 16:04   DG Chest Portable 1 View Result Date: 03/14/2024 EXAM: 1 VIEW(S) XRAY OF THE CHEST 03/14/2024 01:09:00 PM COMPARISON: None available. CLINICAL HISTORY: sob FINDINGS: LINES, TUBES AND DEVICES: Right IJ chest port in place with tip terminating in right atrium. LUNGS AND PLEURA: Low lung volumes. Bibasilar airspace opacities. No pulmonary edema. No pleural effusion. No pneumothorax. HEART AND MEDIASTINUM: Atherosclerotic calcifications. No acute abnormality of the cardiac and mediastinal silhouettes. BONES AND SOFT TISSUES: Degenerative changes of the shoulders. No acute osseous abnormality. IMPRESSION: 1. Low lung volumes with bibasilar airspace opacities, which may reflect atelectasis or pneumonia. Electronically signed by: Norleen Boxer MD 03/14/2024 01:48 PM EDT RP Workstation: [id number]    Assessment and Plan PER BEAGLEY is a 83 y.o. male with medical history significant for essential hypertension, urolithiasis recurrent colon and pancreatic cancer in chemotherapy holiday from June of this year, with recent biliary obstruction status post biliary stent placement on 03/04/2024 by Dr. Jinny, who presented to the emergency room with acute onset of worsening jaundice and right upper quadrant abdominal pain over the last couple of days.  He had nausea and vomiting today.  No fever or chills.  No dysuria, hematuria or flank pain.  He has been having diminished urine output today.   Abdominal pelvic CT scan with contrast revealed the following: 1. Progressive intrahepatic and extrahepatic biliary duct dilation despite interval placement of bile duct stent. The downstream aspect of the biliary stent projects near the ampulla not clearly within the lumen of the duodenum, and may not provide  adequate decompression of the biliary tree. 2. Marked gallbladder distension without evidence of cholelithiasis or acute cholecystitis. 3. Stable complex cystic and solid mass within the pancreatic body, with continued extrinsic compression upon the portal vein, upstream pancreatic duct dilation, and atrophy of the pancreatic body and tail. This has been previously evaluated by MRI. 4. Distended fluid-filled stomach, which may require decompression with enteric catheter. No evidence of bowel  obstruction or ileus. 5. Trace ascites. 6.  Aortic Atherosclerosis.  MRCP abdomen:: Similar severe intrahepatic bile duct dilation with increased abrupt caliber narrowing of the hepatic duct at the confluence with the cystic duct. 2. Persistent distended gallbladder now demonstrates circumferential mural thickening, which may be reactive or reflect acute cholecystitis. 3. Unchanged severely dilated and tortuous main pancreatic duct upstream of an ill-defined, expansile mass in the pancreatic head and neck measuring approximately 4.1 x 2.5 cm, not substantially changed from 03/04/2024. 4. Markedly distended stomach as seen on earlier same day CT. 5. Small volume ascites and diffuse body wall edema, increased from 03/04/2024.  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 on call at [facility] long to see patient could be transferred for ERCP -- other option is IR place percutaneous  biliary drain --ER did not order BC--already on  IV abxs --WBC 18 K---22K---26K --bili 18.6 --cont IVF --d/w Dr Estelita Manas on the phone(GI ERCP MD on call at [facility])-- she recommends to consider Dr jinny do ERCP on Monday. Indication for transfer if pt is on pressors.  --Pt is complicated and quiet sick and would be best served where ERCP is available emergently ([facility]) --Dr Therisa d/w Dr Jinny who is recommending transfer to [facility] for ERCP or get IR to place percutaneous drain. IR message sent --Addendum: in discussion with Dr Manas, Dr Therisa, Dr Philip, Dr Cecille Pt was not accepted by GI at [facility] for ERCP with INR 4.5 (due to liver failure), pt not on pressors and high risk for percutaneous GB drain placement Dr Therisa reached out to Dr jinny who is coming in to exchange the Biliary stent IV vitamin K 10 mg x1 Will give 2 units of FFP for now--one before and one during procedure. Discussed with pt's dter and wife and they understand the complicated/tough situation. They are aware of the risk and complications and agreeable to do what is best of the pt. Will transfer pt to ICU for close monitoring.  Elevated INR --Pt received Vitamin K x1 --FFP ordered  Hypoglycemia -- CBG-20--give 1 amp of dextrose  and place on D5 LR gtt --CBG q 4 hourly  BPH (benign prostatic hyperplasia) - on Flomax .   GERD without esophagitis - continue PPI therapy.  Hypokalemia  --replace IV    Procedures: Family communication :dter and wife at bedside Consults :GI CODE STATUS: DNR DVT Prophylaxis : Level of care: Progressive Status is: Inpatient Remains inpatient appropriate because: sepsis due to cholangitis    TOTAL critical TIME TAKING CARE OF THIS PATIENT: 65 minutes.  >50% time spent on counselling and coordination of care  Note: This dictation was prepared with Dragon dictation along with smaller phrase technology. Any transcriptional errors that result from this process are  unintentional.  Leita Blanch M.D    [facility]   CC: Primary care physician; Sadie Manna, MD

## 2024-03-15 NOTE — Progress Notes (Signed)
 Pharmacy Antibiotic Note  Jared Gutierrez is a 83 y.o. male admitted on 03/14/2024 with infection of unknown source.  Pharmacy has been consulted for Cefepime  and Vancomycin  dosing for 7 days.  Plan: Cefepime  2 gm  q8hr per indication & renal fxn.  Pt given Vancomycin  2000 mg once. Vancomycin  1000 mg IV Q 12 hrs. Goal AUC 400-550. Expected AUC: 486.3 SCr used: 0.78  Follow up culture results to assess for antibiotic optimization. Monitor renal function to assess for any necessary antibiotic dosing changes. Pharmacy will continue to follow and will adjust abx dosing whenever warranted.  Temp (24hrs), Avg:98.1 F (36.7 C), Min:98 F (36.7 C), Max:98.1 F (36.7 C)   Recent Labs  Lab 03/12/24 1304 03/14/24 1313 03/14/24 2229  WBC 5.7 18.2*  --   CREATININE 0.43* 0.78  --   LATICACIDVEN  --   --  2.6*  2.5*    Estimated Creatinine Clearance: 77.1 mL/min (by C-G formula based on SCr of 0.78 mg/dL).    No Known Allergies  Antimicrobials this admission: 10/31 Zosyn  >> x 1 dose 11/01 Cefepime  >> x 7 days 11/01 Flagyl  >> x 7 days 11/01 Vancomycin  >> x 7 days  Microbiology results: No lab cx currently ordered or pending at this time.  Thank you for allowing pharmacy to be a part of this patient's care.  Rankin CANDIE Dills, PharmD, [facility] 03/15/2024 2:13 AM

## 2024-03-15 NOTE — Assessment & Plan Note (Signed)
-   Pain management will be provided.  This is clearly the culprit for his biliary obstruction

## 2024-03-15 NOTE — Anesthesia Procedure Notes (Signed)
 Procedure Name: Intubation Date/Time: 03/15/2024 7:40 PM  Performed by: Landy Francena BIRCH, CRNAPre-anesthesia Checklist: Patient identified, Emergency Drugs available, Suction available and Patient being monitored Patient Re-evaluated:Patient Re-evaluated prior to induction Oxygen Delivery Method: Circle system utilized Preoxygenation: Pre-oxygenation with 100% oxygen Induction Type: IV induction, Rapid sequence and Cricoid Pressure applied Ventilation: Mask ventilation without difficulty Laryngoscope Size: McGrath and 3 Grade View: Grade I Tube type: Oral Tube size: 7.5 mm Number of attempts: 1 Airway Equipment and Method: Stylet, Oral airway and Bite block Placement Confirmation: ETT inserted through vocal cords under direct vision, positive ETCO2 and breath sounds checked- equal and bilateral Secured at: 23 cm Tube secured with: Tape Dental Injury: Teeth and Oropharynx as per pre-operative assessment

## 2024-03-15 NOTE — Assessment & Plan Note (Signed)
Will continue Flomax.

## 2024-03-15 NOTE — Transfer of Care (Signed)
 Immediate Anesthesia Transfer of Care Note  Patient: Jared Gutierrez  Procedure(s) Performed: ERCP, WITH INTERVENTION IF INDICATED STENT REMOVAL INSERTION, STENT, BILE DUCT  Patient Location: ICU  Anesthesia Type:General  Level of Consciousness: awake and alert   Airway & Oxygen Therapy: Patient Spontanous Breathing and Patient connected to face mask oxygen  Post-op Assessment: Report given to RN, Post -op Vital signs reviewed and stable, and Patient moving all extremities  Post vital signs: Reviewed and stable  Last Vitals:  Vitals Value Taken Time  BP 111/55 03/15/24 21:30  Temp    Pulse 80 03/15/24 21:32  Resp 18 03/15/24 21:32  SpO2 100 % 03/15/24 21:32  Vitals shown include unfiled device data.  Last Pain:  Vitals:   03/15/24 1926  TempSrc: Temporal  PainSc:          Complications: No notable events documented.

## 2024-03-15 NOTE — ED Notes (Signed)
 Attempted to draw AM labs.  Unsuccessful at this time.  Lab called for assistance

## 2024-03-15 NOTE — Assessment & Plan Note (Signed)
 Potassium supplemented

## 2024-03-15 NOTE — Assessment & Plan Note (Addendum)
-   This is likely secondary to biliary obstruction that could be related to malfunctioning/displaced/inadequate biliary stent. - He will be admitted to a progressive unit bed. - Given associated manifestations of sepsis with leukocytosis and tachypnea and mild hypotension, will empirically place him on IV vancomycin , cefepime  and Flagyl . - Will continue colestyramine. - GI consultation will be obtained. - Dr. Therisa was notified and is aware about the patient.  Dr. Jinny will be available for ERCP on Monday. - IR was notified about the patient in case he needs percutaneous biliary drainage during the weekend.  Consult can also be obtained for worsening hyperbilirubinemia. - His family was pleased about being admitted here.   - Will follow blood cultures. - His urinalysis showed 11-20 WBCs with rare bacteria and negative nitrites.

## 2024-03-15 NOTE — Assessment & Plan Note (Signed)
-   Will continue PPI therapy.

## 2024-03-15 NOTE — Consult Note (Signed)
 Ruel Kung , MD [address], [place], [place], [address] Phone: [phone] Fax: [phone]  Consultation  Referring Provider:     Dr Tobie Primary Care Physician:  Sadie Manna, MD Primary Gastroenterologist:  Dr. Jinny          Reason for Consultation:     Obstructive Jaundice  Date of Admission:  03/14/2024 Date of Consultation:  03/15/2024         HPI:   Jared Gutierrez is a 83 y.o. male with a history of metastatic colon cancer underwent an ERCP by Dr. Jinny on 12/03/2023.  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   The patient returned to the hospital last night with shortness of breath and abdominal discomfort.  The emergency room physician discussed with me and I had suggested transfer to [facility] for tertiary center for any biliary obstruction and need for emergent ERCP.  Subsequently I was not made aware of the patient's admission and a short while back was made aware that the patient has been admitted to [facility].   On admission alkaline phosphatase of 278 bilirubin is 18.5 up from 14 during last admission.  Creatinine has doubled from 0.31 baseline to 0.73.  MRCP showed severe intrahepatic bile duct dilation with increased abrupt caliber anatomy of the body at the confluence of the cystic duct possible acute cholecystitis.  Severely dilated and tortuous main pancreatic duct margin substantially markedly distended stomach.  Dr. Tobie got in touch with me around 3 PM with a group chat involving Dr. Armbruster/Dr Saintclair .  Transfer to  [facility] was to occur only if patient were to get worse. I suggested rechecking WCC which went up from 18---> 22-->26 , blood pressure low 90's , lactic  acidosis , doubling of creatinine from baseline - indicating multi organ failure. Transfer to [facility] was not accepted due to elevated INR from sepsis.   I spoke with Dr Wohl/Dr Leita Tobie and felt the safest option if possible was ERCP vs IR with elevated INR for decompression of the biliary system and Dr Jinny was willing to come in on his day off .  Pt in the ICU - says he feels ok , BP in the 90's systlic , heart rate around 100 , urine output since am approx 80 cc per hour. Denies any significant discomfort, ng tube in place - not much drainage.   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   Chicken pox    Colon cancer (HCC)    History of kidney stones    Hypertension    Lipoma of colon    Nephrolithiasis    Nephrolithiasis    Obesity    Shingles    Tubular adenoma of colon    multiple fragments    Past Surgical History:  Procedure Laterality Date   BILIARY STENT PLACEMENT  03/04/2024   Procedure: INSERTION, STENT, BILE DUCT;  Surgeon: Jinny Carmine, MD;  Location: [facility] ENDOSCOPY;  Service: Endoscopy;;   COLON SURGERY     COLONOSCOPY N/A 10/02/2014   Procedure: COLONOSCOPY;  Surgeon: Donnice  Vaughn Manes, MD;  Location: [facility] ENDOSCOPY;  Service: Endoscopy;  Laterality: N/A;   COLONOSCOPY WITH PROPOFOL  N/A 02/16/2017   Procedure: COLONOSCOPY WITH PROPOFOL ;  Surgeon: Viktoria Lamar DASEN, MD;  Location: [facility] ENDOSCOPY;  Service: Endoscopy;  Laterality: N/A;   COLONOSCOPY WITH PROPOFOL  N/A 10/05/2020   Procedure: COLONOSCOPY WITH PROPOFOL ;  Surgeon: Maryruth Ole DASEN, MD;  Location: [facility] ENDOSCOPY;  Service: Endoscopy;  Laterality: N/A;   ERCP N/A 03/04/2024   Procedure: ERCP, WITH INTERVENTION IF INDICATED;  Surgeon: Jinny Carmine, MD;  Location: [facility] ENDOSCOPY;  Service: Endoscopy;  Laterality: N/A;   ESOPHAGOGASTRODUODENOSCOPY N/A 12/31/2023   Procedure: EGD (ESOPHAGOGASTRODUODENOSCOPY);   Surgeon: Wilhelmenia Aloha Raddle., MD;  Location: [facility];  Service: Gastroenterology;  Laterality: N/A;   EUS N/A 04/17/2019   Procedure: FULL UPPER ENDOSCOPIC ULTRASOUND (EUS) RADIAL;  Surgeon: Queenie Asberry LABOR, MD;  Location: [facility] ENDOSCOPY;  Service: Gastroenterology;  Laterality: N/A;   EUS N/A 12/31/2023   Procedure: ULTRASOUND, UPPER GI TRACT, ENDOSCOPIC;  Surgeon: Wilhelmenia Aloha Raddle., MD;  Location: [facility] ENDOSCOPY;  Service: Gastroenterology;  Laterality: N/A;   FINE NEEDLE ASPIRATION  12/31/2023   Procedure: FINE NEEDLE ASPIRATION;  Surgeon: Wilhelmenia Aloha Raddle., MD;  Location: [facility] ENDOSCOPY;  Service: Gastroenterology;;   KIDNEY STONE SURGERY     PARTIAL COLECTOMY  10/17/2013   PORTACATH PLACEMENT Right 06/13/2019   Procedure: INSERTION PORT-A-CATH;  Surgeon: Tye Millet, DO;  Location: [facility] ORS;  Service: General;  Laterality: Right;    Prior to Admission medications   Medication Sig Start Date End Date Taking? Authorizing Provider  cholestyramine (QUESTRAN) 4 g packet Take 1 packet (4 g total) by mouth 3 (three) times daily for 14 days. 03/06/24 03/20/24 Yes Agbata, Tochukwu, MD  cyanocobalamin (VITAMIN B12) 500 MCG tablet Take by mouth. 07/26/23 07/25/24 Yes [provider]  feeding supplement (ENSURE PLUS HIGH PROTEIN) LIQD Take 237 mLs by mouth 2 (two) times daily between meals. 03/06/24 04/05/24 Yes Agbata, Tochukwu, MD  furosemide  (LASIX ) 20 MG tablet Take 1 tablet (20 mg total) by mouth daily as needed for edema. 03/12/24  Yes Babara Call, MD  omeprazole  (PRILOSEC) 20 MG capsule Take 20 mg by mouth daily. 12/25/23  Yes [provider]  ondansetron  (ZOFRAN ) 8 MG tablet Take 1 tablet (8 mg total) by mouth 2 (two) times daily as needed for refractory nausea / vomiting. Start on day 3 after chemotherapy. 11/16/20  Yes Babara Call, MD  pantoprazole  (PROTONIX ) 40 MG tablet Take 1 tablet (40 mg total) by mouth every morning. Take in the morning on an empty stomach  12/07/23 03/15/24 Yes Sreenath, Calvin NOVAK, MD  potassium chloride  SA (KLOR-CON  M) 20 MEQ tablet Take 1 tablet (20 mEq total) by mouth daily. 03/12/24  Yes Babara Call, MD  Probiotic Product (PROBIOTIC DAILY PO) Take 1 tablet by mouth daily.   Yes [provider]  tamsulosin  (FLOMAX ) 0.4 MG CAPS capsule Take 0.4 mg by mouth daily after supper. 03/03/24  Yes [provider]  lidocaine -prilocaine  (EMLA ) cream Apply small amount to port and cover with saran wrap 1-2 hours prior to port access Patient not taking: Reported on 03/15/2024 08/27/23   Yu, Zhou, MD  loratadine (CLARITIN) 10 MG tablet Take 10 mg by mouth daily. Patient not taking: Reported on 03/15/2024    [provider]    Family History  Problem Relation Age of Onset   Cancer Mother    Breast cancer Mother    COPD Father      Social History  Tobacco Use   Smoking status: Never   Smokeless tobacco: Never  Vaping Use   Vaping status: Never Used  Substance Use Topics   Alcohol use: Not Currently    Comment: socially    Drug use: No    Allergies as of 03/14/2024   (No Known Allergies)    Review of Systems:    All systems reviewed and negative except where noted in HPI.   Physical Exam:  Vital signs in last 24 hours: Temp:  [96.8 F (36 C)-98 F (36.7 C)] 97.3 F (36.3 C) (11/01 1707) Pulse Rate:  [74-117] 93 (11/01 1507) Resp:  [8-25] 18 (11/01 1707) BP: (81-115)/(46-78) 94/64 (11/01 1707) SpO2:  [89 %-100 %] 100 % (11/01 1707) Weight:  [89.2 kg] 89.2 kg (11/01 1707) Last BM Date : 03/11/24 General:  appears ill  Head:  Normocephalic and atraumatic. Eyes:  ++icterus.   Conjunctiva pink. PERRLA. Ears:  Normal auditory acuity. Neck:  Supple; no masses or thyroidomegaly Lungs: Respirations even and unlabored. Lungs clear to auscultation bilaterally.   No wheezes, crackles, or rhonchi.  Heart:  Regular rate and rhythm;  Without murmur, clicks, rubs or gallops Abdomen:  Soft,generalized  distension , nontender. Normal bowel sounds. No appreciable masses or hepatomegaly.  No rebound or guarding.  Neurologic:  Alert and oriented x3;  grossly normal neurologically. Skin:  Intact without significant lesions or rashes. Cervical Nodes:  No significant cervical adenopathy. Psych:  Alert and cooperative. Normal affect.  LAB RESULTS: Recent Labs    03/14/24 1313 03/15/24 0744 03/15/24 1534  WBC 18.2* 22.4* 25.8*  HGB 8.8* 10.0* 10.3*  HCT 25.3* 28.9* 29.5*  PLT 167 165 185   BMET Recent Labs    03/14/24 1313 03/15/24 0744  NA 134* 138  K 3.0* 3.0*  CL 105 104  CO2 18* 17*  GLUCOSE 102* 71  BUN 6* 11  CREATININE 0.78 0.73  CALCIUM  8.6* 9.0   LFT Recent Labs    03/15/24 0744  PROT 5.5*  ALBUMIN 2.0*  AST 168*  ALT 68*  ALKPHOS 241*  BILITOT 18.6*   PT/INR Recent Labs    03/15/24 0744  LABPROT 44.4*  INR 4.5*    STUDIES: DG Abd Portable 1V Result Date: 03/15/2024 EXAM: 1 VIEW XRAY OF THE ABDOMEN 03/15/2024 05:59:00 PM COMPARISON: 04/11/2023 CT 03/14/2024 CLINICAL HISTORY: [id number] Encounter for imaging study to confirm nasogastric (NG) tube placement [id number] Encounter for imaging study to confirm nasogastric (NG) tube placement FINDINGS: LINES, TUBES AND DEVICES: NG tube tip is in the mid stomach. Biliary stent noted in place. BOWEL: Gaseous distention of bowel in the right abdomen likely reflects colon. SOFT TISSUES: No opaque urinary calculi. BONES: No acute osseous abnormality. IMPRESSION: 1. NG tube tip in the mid stomach. 2. Gaseous distention of bowel in the right abdomen, likely reflecting colon. Electronically signed by: Franky Crease MD 03/15/2024 06:12 PM EDT RP Workstation: [id number]   MR ABDOMEN MRCP W WO CONTAST Result Date: 03/14/2024 CLINICAL DATA:  Known history of colon and pancreatic cancer with worsening abdominal pain, constipation EXAM: MRI ABDOMEN WITHOUT AND WITH CONTRAST (INCLUDING MRCP) TECHNIQUE: Multiplanar multisequence MR imaging  of the abdomen was performed both before and after the administration of intravenous contrast. Heavily T2-weighted images of the biliary and pancreatic ducts were obtained, and three-dimensional MRCP images were rendered by post processing. CONTRAST:  9mL GADAVIST GADOBUTROL 1 MMOL/ML IV SOLN COMPARISON:  Earlier same day CT abdomen and pelvis, MRI abdomen dated 03/04/2024 FINDINGS: Lower chest: No  acute findings. Hepatobiliary: Left hepatic atrophy. No focal hepatic lesions. Similar severe intrahepatic bile duct dilation with increased abrupt caliber narrowing of the hepatic duct at the confluence with the cystic duct (14:39). Persistent distended gallbladder now demonstrates circumferential mural thickening. Pancreas: Unchanged severely dilated and tortuous main pancreatic duct upstream of an ill-defined, expansile mass in the pancreatic head and neck measuring approximately 4.1 x 2.5 cm (19:49), not substantially changed from 03/04/2024. Spleen:  Within normal limits in size and appearance. Adrenals/Urinary Tract: No adrenal nodules. No suspicious renal masses identified. No evidence of hydronephrosis. Bilateral simple cysts. Stomach/Bowel: Markedly distended stomach as seen on earlier same day CT. Vascular/Lymphatic: No pathologically enlarged lymph nodes identified. No abdominal aortic aneurysm demonstrated. Vascular involvement, as before. Other: Small volume ascites and diffuse body wall edema, increased from 03/04/2024. Musculoskeletal: No suspicious bone lesions identified. IMPRESSION: 1. Similar severe intrahepatic bile duct dilation with increased abrupt caliber narrowing of the hepatic duct at the confluence with the cystic duct. 2. Persistent distended gallbladder now demonstrates circumferential mural thickening, which may be reactive or reflect acute cholecystitis. 3. Unchanged severely dilated and tortuous main pancreatic duct upstream of an ill-defined, expansile mass in the pancreatic head and neck  measuring approximately 4.1 x 2.5 cm, not substantially changed from 03/04/2024. 4. Markedly distended stomach as seen on earlier same day CT. 5. Small volume ascites and diffuse body wall edema, increased from 03/04/2024. Electronically Signed   By: Limin  Xu M.D.   On: 03/14/2024 20:38   MR 3D Recon At Scanner Result Date: 03/14/2024 CLINICAL DATA:  Known history of colon and pancreatic cancer with worsening abdominal pain, constipation EXAM: MRI ABDOMEN WITHOUT AND WITH CONTRAST (INCLUDING MRCP) TECHNIQUE: Multiplanar multisequence MR imaging of the abdomen was performed both before and after the administration of intravenous contrast. Heavily T2-weighted images of the biliary and pancreatic ducts were obtained, and three-dimensional MRCP images were rendered by post processing. CONTRAST:  9mL GADAVIST GADOBUTROL 1 MMOL/ML IV SOLN COMPARISON:  Earlier same day CT abdomen and pelvis, MRI abdomen dated 03/04/2024 FINDINGS: Lower chest: No acute findings. Hepatobiliary: Left hepatic atrophy. No focal hepatic lesions. Similar severe intrahepatic bile duct dilation with increased abrupt caliber narrowing of the hepatic duct at the confluence with the cystic duct (14:39). Persistent distended gallbladder now demonstrates circumferential mural thickening. Pancreas: Unchanged severely dilated and tortuous main pancreatic duct upstream of an ill-defined, expansile mass in the pancreatic head and neck measuring approximately 4.1 x 2.5 cm (19:49), not substantially changed from 03/04/2024. Spleen:  Within normal limits in size and appearance. Adrenals/Urinary Tract: No adrenal nodules. No suspicious renal masses identified. No evidence of hydronephrosis. Bilateral simple cysts. Stomach/Bowel: Markedly distended stomach as seen on earlier same day CT. Vascular/Lymphatic: No pathologically enlarged lymph nodes identified. No abdominal aortic aneurysm demonstrated. Vascular involvement, as before. Other: Small volume  ascites and diffuse body wall edema, increased from 03/04/2024. Musculoskeletal: No suspicious bone lesions identified. IMPRESSION: 1. Similar severe intrahepatic bile duct dilation with increased abrupt caliber narrowing of the hepatic duct at the confluence with the cystic duct. 2. Persistent distended gallbladder now demonstrates circumferential mural thickening, which may be reactive or reflect acute cholecystitis. 3. Unchanged severely dilated and tortuous main pancreatic duct upstream of an ill-defined, expansile mass in the pancreatic head and neck measuring approximately 4.1 x 2.5 cm, not substantially changed from 03/04/2024. 4. Markedly distended stomach as seen on earlier same day CT. 5. Small volume ascites and diffuse body wall edema, increased from 03/04/2024. Electronically Signed  By: Limin  Xu M.D.   On: 03/14/2024 20:38   CT ABDOMEN PELVIS W CONTRAST Result Date: 03/14/2024 CLINICAL DATA:  Constipation, short of breath, bilateral lower extremity edema, history of colon cancer, recent stent placement for obstructive jaundice EXAM: CT ABDOMEN AND PELVIS WITH CONTRAST TECHNIQUE: Multidetector CT imaging of the abdomen and pelvis was performed using the standard protocol following bolus administration of intravenous contrast. RADIATION DOSE REDUCTION: This exam was performed according to the departmental dose-optimization program which includes automated exposure control, adjustment of the mA and/or kV according to patient size and/or use of iterative reconstruction technique. CONTRAST:  OMNIPAQUE  IOHEXOL  300 MG/ML  SOLN COMPARISON:  03/04/2024, 02/27/2024 FINDINGS: Lower chest: No acute pleural or parenchymal lung disease. Stable cystic changes in the right lower lobe with bibasilar bronchiectasis again noted. Hepatobiliary: There is progressive intrahepatic and extrahepatic biliary duct dilation despite interval placement of a biliary stent. The proximal aspect of the stent is within the  right common hepatic duct, and distal aspect within the downstream common bile duct. The tip does not appear to extend into the lumen of the duodenum, and may not provide adequate decompression of the biliary tree. Marked distension of the gallbladder with no evidence of acute cholecystitis. No acute parenchymal liver abnormality. Pancreas: Complex cystic and solid mass at the junction of the head and body of the pancreas again noted, measuring approximately 3.1 x 2.7 cm, previously evaluated by MRI. There is upstream pancreatic duct dilation and atrophy of the parenchyma within the distal body and tail the pancreas, stable. No acute inflammatory changes. Spleen: Normal in size without focal abnormality. Adrenals/Urinary Tract: The adrenals are stable. Bilateral renal cortical and peripelvic cysts do not require specific imaging follow-up. The kidneys enhance normally. Bladder is unremarkable. Stomach/Bowel: No bowel obstruction or ileus. Normal appendix right lower quadrant. Postsurgical changes from previous partial colon resection. No bowel wall thickening or acute inflammatory changes. Distended fluid-filled stomach is noted, consideration could be given to decompression with enteric catheter. Vascular/Lymphatic: Aortic atherosclerosis. 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 No enlarged abdominal or pelvic lymph nodes. Reproductive: Stable enlargement of the prostate. Other: Trace ascites most pronounced in the right upper quadrant and left paracolic gutter. No free intraperitoneal gas. No abdominal wall hernia. Musculoskeletal: No acute or destructive bony abnormalities. Reconstructed images demonstrate no additional findings. IMPRESSION: 1. Progressive intrahepatic and extrahepatic biliary duct dilation despite interval placement of bile duct stent. The downstream aspect of the biliary stent projects  near the ampulla not clearly within the lumen of the duodenum, and may not provide adequate decompression of the biliary tree. 2. Marked gallbladder distension without evidence of cholelithiasis or acute cholecystitis. 3. Stable complex cystic and solid mass within the pancreatic body, with continued extrinsic compression upon the portal vein, upstream pancreatic duct dilation, and atrophy of the pancreatic body and tail. This has been previously evaluated by MRI. 4. Distended fluid-filled stomach, which may require decompression with enteric catheter. No evidence of bowel obstruction or ileus. 5. Trace ascites. 6.  Aortic Atherosclerosis (ICD10-I70.0). Electronically Signed   By: Ozell Daring M.D.   On: 03/14/2024 16:04   DG Chest Portable 1 View Result Date: 03/14/2024 EXAM: 1 VIEW(S) XRAY OF THE CHEST 03/14/2024 01:09:00 PM COMPARISON: None available. CLINICAL HISTORY: sob FINDINGS: LINES, TUBES AND DEVICES: Right IJ chest port in place with tip terminating in right atrium. LUNGS AND PLEURA:  Low lung volumes. Bibasilar airspace opacities. No pulmonary edema. No pleural effusion. No pneumothorax. HEART AND MEDIASTINUM: Atherosclerotic calcifications. No acute abnormality of the cardiac and mediastinal silhouettes. BONES AND SOFT TISSUES: Degenerative changes of the shoulders. No acute osseous abnormality. IMPRESSION: 1. Low lung volumes with bibasilar airspace opacities, which may reflect atelectasis or pneumonia. Electronically signed by: Norleen Boxer MD 03/14/2024 01:48 PM EDT RP Workstation: [id number]   Vitals:   03/15/24 1507 03/15/24 1707  BP: 101/64 94/64  Pulse: 93   Resp: 18 18  Temp: (!) 96.8 F (36 C) (!) 97.3 F (36.3 C)  SpO2: 100% 100%      Impression / Plan:   Jared Gutierrez is a 83 y.o. y/o male with a history of metastatic colon cancer pancreatic mass recent admission for biliary obstruction status post stent presents back to the hospital with abdominal pain elevated white  cell count acute kidney failure, lactic acidosis low blood pressure all indicating multiorgan failure likely from acute cholangitis.  Transfer to [facility] was refused due to elevated INR. I did explain INR elevated due to sepsis and organ failure and cannot be reversed until biliary drainage.  INR 4.5 Limited options with ERCP versus IR drainage.  With elevated INR I believe the risks of IR procedure may be higher than ERCP, I called Dr. Jinny- discussed situation . He  is not on-call today but was kind enough to come in to perform the procedure as an emergency .    I have discussed the above with the family members and explained the plan .    Plan  ERCP under FFP cover  NPO Informed OR and Endo nurses.   I have discussed alternative options, risks & benefits,  which include, but are not limited to, bleeding, infection, perforation,respiratory complication & drug reaction, acute pancreatitis and death .  The patient agrees with this plan & written consent will be obtained.    Spend over 120 mins discussing with specialists and coordinating care of the critically ill patient  .   Thank you for involving me in the care of this patient.      LOS: 1 day   Ruel Kung, MD  03/15/2024, 6:16 PM

## 2024-03-15 NOTE — Anesthesia Preprocedure Evaluation (Signed)
 Anesthesia Evaluation  Patient identified by MRN, date of birth, ID band Patient awake    Reviewed: Allergy & Precautions, H&P , NPO status , Patient's Chart, lab work & pertinent test results, reviewed documented beta blocker date and time   History of Anesthesia Complications Negative for: history of anesthetic complications  Airway Mallampati: II  TM Distance: >3 FB Neck ROM: full    Dental  (+) Teeth Intact, Dental Advidsory Given   Pulmonary neg pulmonary ROS   Pulmonary exam normal        Cardiovascular Exercise Tolerance: Poor hypertension, On Medications (-) angina Normal cardiovascular exam Rhythm:regular Rate:Normal     Neuro/Psych neg Seizures  Neuromuscular disease  negative psych ROS   GI/Hepatic Neg liver ROS,GERD  Medicated,,  Endo/Other  negative endocrine ROS    Renal/GU CRFRenal disease  negative genitourinary   Musculoskeletal   Abdominal   Peds  Hematology  (+) Blood dyscrasia, anemia   Anesthesia Other Findings Past Medical History: No date: Arthritis     Comment:  OSTEOARTHRITIS No date: Cancer (HCC) No date: Cavitary lesion of lung     Comment:  RIGHT LOWER LOBE No date: Chicken pox No date: Colon cancer (HCC) No date: History of kidney stones No date: Hypertension No date: Lipoma of colon No date: Nephrolithiasis No date: Nephrolithiasis No date: Obesity No date: Shingles No date: Tubular adenoma of colon     Comment:  multiple fragments Past Surgical History: No date: COLON SURGERY 10/02/2014: COLONOSCOPY; N/A     Comment:  Procedure: COLONOSCOPY;  Surgeon: Donnice Vaughn Manes,               MD;  Location: [facility] ENDOSCOPY;  Service: Endoscopy;                Laterality: N/A; 02/16/2017: COLONOSCOPY WITH PROPOFOL ; N/A     Comment:  Procedure: COLONOSCOPY WITH PROPOFOL ;  Surgeon: Viktoria Lamar DASEN, MD;  Location: [facility] ENDOSCOPY;  Service:               Endoscopy;   Laterality: N/A; 10/05/2020: COLONOSCOPY WITH PROPOFOL ; N/A     Comment:  Procedure: COLONOSCOPY WITH PROPOFOL ;  Surgeon:               Maryruth Ole DASEN, MD;  Location: [facility] ENDOSCOPY;                Service: Endoscopy;  Laterality: N/A; 12/31/2023: ESOPHAGOGASTRODUODENOSCOPY; N/A     Comment:  Procedure: EGD (ESOPHAGOGASTRODUODENOSCOPY);  Surgeon:               Wilhelmenia Aloha Raddle., MD;  Location: [facility];                Service: Gastroenterology;  Laterality: N/A; 04/17/2019: EUS; N/A     Comment:  Procedure: FULL UPPER ENDOSCOPIC ULTRASOUND (EUS)               RADIAL;  Surgeon: Queenie Asberry LABOR, MD;  Location:               [facility] ENDOSCOPY;  Service: Gastroenterology;  Laterality:               N/A; 12/31/2023: EUS; N/A     Comment:  Procedure: ULTRASOUND, UPPER GI TRACT, ENDOSCOPIC;                Surgeon: Mansouraty, Aloha Raddle., MD;  Location: [facility]  ENDOSCOPY;  Service: Gastroenterology;  Laterality: N/A; 12/31/2023: FINE NEEDLE ASPIRATION     Comment:  Procedure: FINE NEEDLE ASPIRATION;  Surgeon: Wilhelmenia,              Aloha Raddle., MD;  Location: [facility] ENDOSCOPY;  Service:               Gastroenterology;; No date: KIDNEY STONE SURGERY 10/17/2013: PARTIAL COLECTOMY 06/13/2019: PORTACATH PLACEMENT; Right     Comment:  Procedure: INSERTION PORT-A-CATH;  Surgeon: Tye Millet, DO;  Location: [facility] ORS;  Service: General;                Laterality: Right; BMI    Body Mass Index: 28.43 kg/m     Reproductive/Obstetrics negative OB ROS                              Anesthesia Physical Anesthesia Plan  ASA: 4 and emergent  Anesthesia Plan: General   Post-op Pain Management:    Induction: Intravenous, Rapid sequence and Cricoid pressure planned  PONV Risk Score and Plan: 3 and Ondansetron , Dexamethasone  and Treatment may vary due to age or medical condition  Airway Management Planned: Oral ETT  Additional Equipment:    Intra-op Plan:   Post-operative Plan: Extubation in OR  Informed Consent: I have reviewed the patients History and Physical, chart, labs and discussed the procedure including the risks, benefits and alternatives for the proposed anesthesia with the patient or authorized representative who has indicated his/her understanding and acceptance.     Dental Advisory Given  Plan Discussed with: CRNA  Anesthesia Plan Comments:          Anesthesia Quick Evaluation

## 2024-03-16 ENCOUNTER — Inpatient Hospital Stay

## 2024-03-16 DIAGNOSIS — N179 Acute kidney failure, unspecified: Secondary | ICD-10-CM

## 2024-03-16 DIAGNOSIS — A419 Sepsis, unspecified organism: Principal | ICD-10-CM

## 2024-03-16 DIAGNOSIS — R6521 Severe sepsis with septic shock: Secondary | ICD-10-CM

## 2024-03-16 LAB — COOXEMETRY PANEL
Carboxyhemoglobin: 1.2 % (ref 0.5–1.5)
Methemoglobin: <0.7 % (ref 0.0–1.5)
O2 Saturation: 85 %
Total hemoglobin: 8.2 g/dL — ABNORMAL LOW (ref 12.0–16.0)
Total oxygen content: 84 %

## 2024-03-16 LAB — HEPATIC FUNCTION PANEL
ALT: 86 U/L — ABNORMAL HIGH (ref 0–44)
AST: 225 U/L — ABNORMAL HIGH (ref 15–41)
Albumin: 1.7 g/dL — ABNORMAL LOW (ref 3.5–5.0)
Alkaline Phosphatase: 162 U/L — ABNORMAL HIGH (ref 38–126)
Bilirubin, Direct: 8.2 mg/dL — ABNORMAL HIGH (ref 0.0–0.2)
Indirect Bilirubin: 7.2 mg/dL — ABNORMAL HIGH (ref 0.3–0.9)
Total Bilirubin: 15.4 mg/dL — ABNORMAL HIGH (ref 0.0–1.2)
Total Protein: 4.6 g/dL — ABNORMAL LOW (ref 6.5–8.1)

## 2024-03-16 LAB — BASIC METABOLIC PANEL WITH GFR
Anion gap: 14 (ref 5–15)
Anion gap: 12 (ref 5–15)
BUN: 16 mg/dL (ref 8–23)
BUN: 20 mg/dL (ref 8–23)
CO2: 14 mmol/L — ABNORMAL LOW (ref 22–32)
CO2: 15 mmol/L — ABNORMAL LOW (ref 22–32)
Calcium: 7.8 mg/dL — ABNORMAL LOW (ref 8.9–10.3)
Calcium: 8 mg/dL — ABNORMAL LOW (ref 8.9–10.3)
Chloride: 109 mmol/L (ref 98–111)
Chloride: 106 mmol/L (ref 98–111)
Creatinine, Ser: 1.31 mg/dL — ABNORMAL HIGH (ref 0.61–1.24)
Creatinine, Ser: 1.36 mg/dL — ABNORMAL HIGH (ref 0.61–1.24)
GFR, Estimated: 54 mL/min — ABNORMAL LOW (ref >60)
GFR, Estimated: 52 mL/min — ABNORMAL LOW (ref >60)
Glucose, Bld: 115 mg/dL — ABNORMAL HIGH (ref 70–99)
Glucose, Bld: 148 mg/dL — ABNORMAL HIGH (ref 70–99)
Potassium: 2.6 mmol/L — CL (ref 3.5–5.1)
Potassium: 3.3 mmol/L — ABNORMAL LOW (ref 3.5–5.1)
Sodium: 137 mmol/L (ref 135–145)
Sodium: 133 mmol/L — ABNORMAL LOW (ref 135–145)

## 2024-03-16 LAB — CBC
HCT: 23.5 % — ABNORMAL LOW (ref 39.0–52.0)
Hemoglobin: 8.1 g/dL — ABNORMAL LOW (ref 13.0–17.0)
MCH: 32.4 pg (ref 26.0–34.0)
MCHC: 34.5 g/dL (ref 30.0–36.0)
MCV: 94 fL (ref 80.0–100.0)
Platelets: 143 K/uL — ABNORMAL LOW (ref 150–400)
RBC: 2.5 MIL/uL — ABNORMAL LOW (ref 4.22–5.81)
RDW: 17.2 % — ABNORMAL HIGH (ref 11.5–15.5)
WBC: 23.4 K/uL — ABNORMAL HIGH (ref 4.0–10.5)
nRBC: 0 % (ref 0.0–0.2)

## 2024-03-16 LAB — URINE CULTURE: Culture: NO GROWTH

## 2024-03-16 LAB — CREATININE, URINE, RANDOM: Creatinine, Urine: 113 mg/dL

## 2024-03-16 LAB — PROTIME-INR
INR: 3.6 — ABNORMAL HIGH (ref 0.8–1.2)
INR: 3 — ABNORMAL HIGH (ref 0.8–1.2)
Prothrombin Time: 37.8 s — ABNORMAL HIGH (ref 11.4–15.2)
Prothrombin Time: 32.3 s — ABNORMAL HIGH (ref 11.4–15.2)

## 2024-03-16 LAB — LACTIC ACID, PLASMA
Lactic Acid, Venous: 3.9 mmol/L (ref 0.5–1.9)
Lactic Acid, Venous: 4.3 mmol/L (ref 0.5–1.9)
Lactic Acid, Venous: 2.7 mmol/L (ref 0.5–1.9)
Lactic Acid, Venous: 2.7 mmol/L (ref 0.5–1.9)
Lactic Acid, Venous: 2.6 mmol/L (ref 0.5–1.9)
Lactic Acid, Venous: 2.3 mmol/L (ref 0.5–1.9)

## 2024-03-16 LAB — SODIUM, URINE, RANDOM: Sodium, Ur: <30 mmol/L

## 2024-03-16 LAB — GLUCOSE, CAPILLARY
Glucose-Capillary: 124 mg/dL — ABNORMAL HIGH (ref 70–99)
Glucose-Capillary: 98 mg/dL (ref 70–99)
Glucose-Capillary: 99 mg/dL (ref 70–99)
Glucose-Capillary: 83 mg/dL (ref 70–99)
Glucose-Capillary: 130 mg/dL — ABNORMAL HIGH (ref 70–99)
Glucose-Capillary: 134 mg/dL — ABNORMAL HIGH (ref 70–99)

## 2024-03-16 LAB — MAGNESIUM: Magnesium: 1.3 mg/dL — ABNORMAL LOW (ref 1.7–2.4)

## 2024-03-16 MED ORDER — POTASSIUM CHLORIDE 10 MEQ/50ML IV SOLN
10.0000 meq | INTRAVENOUS | Status: AC
  Administered 2024-03-16 (×6): 10 meq via INTRAVENOUS
  Filled 2024-03-16 (×6): qty 50

## 2024-03-16 MED ORDER — LACTATED RINGERS IV BOLUS
500.0000 mL | Freq: Once | INTRAVENOUS | Status: AC
  Administered 2024-03-16: 500 mL via INTRAVENOUS

## 2024-03-16 MED ORDER — DEXTROSE IN LACTATED RINGERS 5 % IV SOLN: INTRAVENOUS | Status: DC

## 2024-03-16 MED ORDER — NOREPINEPHRINE 4 MG/250ML-% IV SOLN
INTRAVENOUS | Status: AC
  Administered 2024-03-16: 2 ug/min via INTRAVENOUS
  Filled 2024-03-16: qty 250

## 2024-03-16 MED ORDER — MAGNESIUM SULFATE 2 GM/50ML IV SOLN
2.0000 g | Freq: Once | INTRAVENOUS | Status: AC
  Administered 2024-03-16: 2 g via INTRAVENOUS
  Filled 2024-03-16: qty 50

## 2024-03-16 MED ORDER — VASOPRESSIN 20 UNITS/100 ML INFUSION FOR SHOCK
0.0000 [IU]/min | INTRAVENOUS | Status: DC
  Administered 2024-03-16: 0.03 [IU]/min via INTRAVENOUS
  Administered 2024-03-16: 0.02 [IU]/min via INTRAVENOUS
  Filled 2024-03-16 (×2): qty 100

## 2024-03-16 MED ORDER — LACTATED RINGERS IV BOLUS
1000.0000 mL | Freq: Once | INTRAVENOUS | Status: AC
  Administered 2024-03-16: 1000 mL via INTRAVENOUS

## 2024-03-16 MED ORDER — ALBUMIN HUMAN 25 % IV SOLN
25.0000 g | Freq: Four times a day (QID) | INTRAVENOUS | Status: AC
  Administered 2024-03-16: 12.5 g via INTRAVENOUS
  Administered 2024-03-16: 25 g via INTRAVENOUS
  Administered 2024-03-17: 12.5 g via INTRAVENOUS
  Filled 2024-03-16 (×4): qty 100

## 2024-03-16 MED ORDER — SODIUM CHLORIDE 0.9 % IV SOLN
2.0000 g | Freq: Two times a day (BID) | INTRAVENOUS | Status: DC
  Administered 2024-03-16 – 2024-03-19 (×7): 2 g via INTRAVENOUS
  Filled 2024-03-16 (×8): qty 12.5

## 2024-03-16 MED ORDER — MIDODRINE HCL 5 MG PO TABS
10.0000 mg | ORAL_TABLET | Freq: Three times a day (TID) | ORAL | Status: DC
  Administered 2024-03-16 – 2024-03-20 (×9): 10 mg
  Filled 2024-03-16 (×9): qty 2

## 2024-03-16 MED ORDER — PANTOPRAZOLE SODIUM 40 MG IV SOLR
40.0000 mg | Freq: Every day | INTRAVENOUS | Status: DC
  Administered 2024-03-16 – 2024-03-20 (×5): 40 mg via INTRAVENOUS
  Filled 2024-03-16 (×5): qty 10

## 2024-03-16 MED ORDER — HYDROCORTISONE SOD SUC (PF) 100 MG IJ SOLR
100.0000 mg | Freq: Three times a day (TID) | INTRAMUSCULAR | Status: DC
  Administered 2024-03-16 – 2024-03-17 (×3): 100 mg via INTRAVENOUS
  Filled 2024-03-16 (×3): qty 2

## 2024-03-16 MED ORDER — NOREPINEPHRINE 4 MG/250ML-% IV SOLN
0.0000 ug/min | INTRAVENOUS | Status: DC
  Administered 2024-03-16: 7 ug/min via INTRAVENOUS
  Administered 2024-03-16: 10 ug/min via INTRAVENOUS
  Filled 2024-03-16 (×2): qty 250

## 2024-03-16 MED ORDER — VANCOMYCIN HCL 1250 MG/250ML IV SOLN
1250.0000 mg | INTRAVENOUS | Status: DC
  Administered 2024-03-16: 1250 mg via INTRAVENOUS
  Filled 2024-03-16: qty 250

## 2024-03-16 MED ORDER — MAGNESIUM SULFATE 2 GM/50ML IV SOLN
2.0000 g | Freq: Once | INTRAVENOUS | Status: DC
  Filled 2024-03-16: qty 50

## 2024-03-16 MED ORDER — ALBUMIN HUMAN 25 % IV SOLN
12.5000 g | Freq: Once | INTRAVENOUS | Status: AC
  Administered 2024-03-16: 12.5 g via INTRAVENOUS
  Filled 2024-03-16: qty 50

## 2024-03-16 MED ORDER — POTASSIUM CHLORIDE 10 MEQ/100ML IV SOLN
10.0000 meq | INTRAVENOUS | Status: AC
  Administered 2024-03-16 (×2): 10 meq via INTRAVENOUS
  Filled 2024-03-16 (×2): qty 100

## 2024-03-16 NOTE — Care Management Important Message (Signed)
 Important Message  Patient Details  Name: Jared Gutierrez MRN: [id number] Date of Birth: 05/03/1941   Important Message Given:  Yes - Medicare IM     Rojelio SHAUNNA Rattler 03/16/2024, 5:57 PM

## 2024-03-16 NOTE — Progress Notes (Signed)
 Ruel Kung , MD [address], [place], [place], [address] Phone: [phone] Fax: [phone]   ABAS LEICHT is being followed for ascending cholangitis day 1 of follow up   Subjective: Has been hypotensive requiring 2 pressors.  Patient has no complaints making urine.  Objective: Vital signs in last 24 hours: Vitals:   03/16/24 0800 03/16/24 0810 03/16/24 0815 03/16/24 0830  BP:      Pulse: 78 73 73 67  Resp: 15 12 11 14   Temp: (!) 96.6 F (35.9 C) (!) 96.6 F (35.9 C) (!) 96.6 F (35.9 C) (!) 96.6 F (35.9 C)  TempSrc:      SpO2: 96% 97% 97% 97%  Weight:      Height:       Weight change: -2.88 kg  Intake/Output Summary (Last 24 hours) at 03/16/2024 0845 Last data filed at 03/16/2024 0830 Gross per 24 hour  Intake 4921.25 ml  Output 2640 ml  Net 2281.25 ml     Exam: Heart:: Regular rate and rhythm or S1S2 present Lungs: normal Abdomen: soft, nontender, normal bowel sounds   Lab Results: @LABTEST2 @ Micro Results: Recent Results (from the past 240 hours)  MRSA Next Gen by PCR, Nasal     Status: None   Collection Time: 03/15/24  5:08 PM   Specimen: Nasal Mucosa; Nasal Swab  Result Value Ref Range Status   MRSA by PCR Next Gen NOT DETECTED NOT DETECTED Final    Comment: (NOTE) The GeneXpert MRSA Assay (FDA approved for NASAL specimens only), is one component of a comprehensive MRSA colonization surveillance program. It is not intended to diagnose MRSA infection nor to guide or monitor treatment for MRSA infections. Test performance is not FDA approved in patients less than 94 years old. Performed at [facility], [address] [place], [place] [address]    Studies/Results: [place] Abd 1 View Result Date: 03/16/2024 CLINICAL DATA:  Nasogastric tube placement. EXAM: DG ABDOMEN 1V COMPARISON:  03/15/2024 FINDINGS: Nasogastric tube shows a loop within the gastric fundus, with distal tip overlying the gastric body. Gaseous  distension of small bowel and transverse colon noted, likely due to ileus. IMPRESSION: Nasogastric tube tip overlies the gastric body. Probable ileus. Electronically Signed   By: Norleen DELENA Kil M.D.   On: 03/16/2024 05:43   DG C-Arm 1-60 Min-No Report Result Date: 03/15/2024 Fluoroscopy was utilized by the requesting physician.  No radiographic interpretation.   DG Abd Portable 1V Result Date: 03/15/2024 EXAM: 1 VIEW XRAY OF THE ABDOMEN 03/15/2024 05:59:00 PM COMPARISON: 04/11/2023 CT 03/14/2024 CLINICAL HISTORY: [id number] Encounter for imaging study to confirm nasogastric (NG) tube placement [id number] Encounter for imaging study to confirm nasogastric (NG) tube placement FINDINGS: LINES, TUBES AND DEVICES: NG tube tip is in the mid stomach. Biliary stent noted in place. BOWEL: Gaseous distention of bowel in the right abdomen likely reflects colon. SOFT TISSUES: No opaque urinary calculi. BONES: No acute osseous abnormality. IMPRESSION: 1. NG tube tip in the mid stomach. 2. Gaseous distention of bowel in the right abdomen, likely reflecting colon. Electronically signed by: Franky Crease MD 03/15/2024 06:12 PM EDT RP Workstation: [id number]   MR ABDOMEN MRCP W WO CONTAST Result Date: 03/14/2024 CLINICAL DATA:  Known history of colon and pancreatic cancer with worsening abdominal pain, constipation EXAM: MRI ABDOMEN WITHOUT AND WITH CONTRAST (INCLUDING MRCP) TECHNIQUE: Multiplanar multisequence MR imaging of the abdomen was performed both before and after the administration of intravenous contrast. Heavily T2-weighted images of the  biliary and pancreatic ducts were obtained, and three-dimensional MRCP images were rendered by post processing. CONTRAST:  9mL GADAVIST GADOBUTROL 1 MMOL/ML IV SOLN COMPARISON:  Earlier same day CT abdomen and pelvis, MRI abdomen dated 03/04/2024 FINDINGS: Lower chest: No acute findings. Hepatobiliary: Left hepatic atrophy. No focal hepatic lesions. Similar severe intrahepatic bile duct  dilation with increased abrupt caliber narrowing of the hepatic duct at the confluence with the cystic duct (14:39). Persistent distended gallbladder now demonstrates circumferential mural thickening. Pancreas: Unchanged severely dilated and tortuous main pancreatic duct upstream of an ill-defined, expansile mass in the pancreatic head and neck measuring approximately 4.1 x 2.5 cm (19:49), not substantially changed from 03/04/2024. Spleen:  Within normal limits in size and appearance. Adrenals/Urinary Tract: No adrenal nodules. No suspicious renal masses identified. No evidence of hydronephrosis. Bilateral simple cysts. Stomach/Bowel: Markedly distended stomach as seen on earlier same day CT. Vascular/Lymphatic: No pathologically enlarged lymph nodes identified. No abdominal aortic aneurysm demonstrated. Vascular involvement, as before. Other: Small volume ascites and diffuse body wall edema, increased from 03/04/2024. Musculoskeletal: No suspicious bone lesions identified. IMPRESSION: 1. Similar severe intrahepatic bile duct dilation with increased abrupt caliber narrowing of the hepatic duct at the confluence with the cystic duct. 2. Persistent distended gallbladder now demonstrates circumferential mural thickening, which may be reactive or reflect acute cholecystitis. 3. Unchanged severely dilated and tortuous main pancreatic duct upstream of an ill-defined, expansile mass in the pancreatic head and neck measuring approximately 4.1 x 2.5 cm, not substantially changed from 03/04/2024. 4. Markedly distended stomach as seen on earlier same day CT. 5. Small volume ascites and diffuse body wall edema, increased from 03/04/2024. Electronically Signed   By: Limin  Xu M.D.   On: 03/14/2024 20:38   MR 3D Recon At Scanner Result Date: 03/14/2024 CLINICAL DATA:  Known history of colon and pancreatic cancer with worsening abdominal pain, constipation EXAM: MRI ABDOMEN WITHOUT AND WITH CONTRAST (INCLUDING MRCP) TECHNIQUE:  Multiplanar multisequence MR imaging of the abdomen was performed both before and after the administration of intravenous contrast. Heavily T2-weighted images of the biliary and pancreatic ducts were obtained, and three-dimensional MRCP images were rendered by post processing. CONTRAST:  9mL GADAVIST GADOBUTROL 1 MMOL/ML IV SOLN COMPARISON:  Earlier same day CT abdomen and pelvis, MRI abdomen dated 03/04/2024 FINDINGS: Lower chest: No acute findings. Hepatobiliary: Left hepatic atrophy. No focal hepatic lesions. Similar severe intrahepatic bile duct dilation with increased abrupt caliber narrowing of the hepatic duct at the confluence with the cystic duct (14:39). Persistent distended gallbladder now demonstrates circumferential mural thickening. Pancreas: Unchanged severely dilated and tortuous main pancreatic duct upstream of an ill-defined, expansile mass in the pancreatic head and neck measuring approximately 4.1 x 2.5 cm (19:49), not substantially changed from 03/04/2024. Spleen:  Within normal limits in size and appearance. Adrenals/Urinary Tract: No adrenal nodules. No suspicious renal masses identified. No evidence of hydronephrosis. Bilateral simple cysts. Stomach/Bowel: Markedly distended stomach as seen on earlier same day CT. Vascular/Lymphatic: No pathologically enlarged lymph nodes identified. No abdominal aortic aneurysm demonstrated. Vascular involvement, as before. Other: Small volume ascites and diffuse body wall edema, increased from 03/04/2024. Musculoskeletal: No suspicious bone lesions identified. IMPRESSION: 1. Similar severe intrahepatic bile duct dilation with increased abrupt caliber narrowing of the hepatic duct at the confluence with the cystic duct. 2. Persistent distended gallbladder now demonstrates circumferential mural thickening, which may be reactive or reflect acute cholecystitis. 3. Unchanged severely dilated and tortuous main pancreatic duct upstream of an ill-defined, expansile  mass in  the pancreatic head and neck measuring approximately 4.1 x 2.5 cm, not substantially changed from 03/04/2024. 4. Markedly distended stomach as seen on earlier same day CT. 5. Small volume ascites and diffuse body wall edema, increased from 03/04/2024. Electronically Signed   By: Limin  Xu M.D.   On: 03/14/2024 20:38   CT ABDOMEN PELVIS W CONTRAST Result Date: 03/14/2024 CLINICAL DATA:  Constipation, short of breath, bilateral lower extremity edema, history of colon cancer, recent stent placement for obstructive jaundice EXAM: CT ABDOMEN AND PELVIS WITH CONTRAST TECHNIQUE: Multidetector CT imaging of the abdomen and pelvis was performed using the standard protocol following bolus administration of intravenous contrast. RADIATION DOSE REDUCTION: This exam was performed according to the departmental dose-optimization program which includes automated exposure control, adjustment of the mA and/or kV according to patient size and/or use of iterative reconstruction technique. CONTRAST:  OMNIPAQUE  IOHEXOL  300 MG/ML  SOLN COMPARISON:  03/04/2024, 02/27/2024 FINDINGS: Lower chest: No acute pleural or parenchymal lung disease. Stable cystic changes in the right lower lobe with bibasilar bronchiectasis again noted. Hepatobiliary: There is progressive intrahepatic and extrahepatic biliary duct dilation despite interval placement of a biliary stent. The proximal aspect of the stent is within the right common hepatic duct, and distal aspect within the downstream common bile duct. The tip does not appear to extend into the lumen of the duodenum, and may not provide adequate decompression of the biliary tree. Marked distension of the gallbladder with no evidence of acute cholecystitis. No acute parenchymal liver abnormality. Pancreas: Complex cystic and solid mass at the junction of the head and body of the pancreas again noted, measuring approximately 3.1 x 2.7 cm, previously evaluated by MRI. There is upstream  pancreatic duct dilation and atrophy of the parenchyma within the distal body and tail the pancreas, stable. No acute inflammatory changes. Spleen: Normal in size without focal abnormality. Adrenals/Urinary Tract: The adrenals are stable. Bilateral renal cortical and peripelvic cysts do not require specific imaging follow-up. The kidneys enhance normally. Bladder is unremarkable. Stomach/Bowel: No bowel obstruction or ileus. Normal appendix right lower quadrant. Postsurgical changes from previous partial colon resection. No bowel wall thickening or acute inflammatory changes. Distended fluid-filled stomach is noted, consideration could be given to decompression with enteric catheter. Vascular/Lymphatic: Aortic atherosclerosis. 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 No enlarged abdominal or pelvic lymph nodes. Reproductive: Stable enlargement of the prostate. Other: Trace ascites most pronounced in the right upper quadrant and left paracolic gutter. No free intraperitoneal gas. No abdominal wall hernia. Musculoskeletal: No acute or destructive bony abnormalities. Reconstructed images demonstrate no additional findings. IMPRESSION: 1. Progressive intrahepatic and extrahepatic biliary duct dilation despite interval placement of bile duct stent. The downstream aspect of the biliary stent projects near the ampulla not clearly within the lumen of the duodenum, and may not provide adequate decompression of the biliary tree. 2. Marked gallbladder distension without evidence of cholelithiasis or acute cholecystitis. 3. Stable complex cystic and solid mass within the pancreatic body, with continued extrinsic compression upon the portal vein, upstream pancreatic duct dilation, and atrophy of the pancreatic body and tail. This has been previously evaluated by MRI. 4. Distended fluid-filled stomach, which may require  decompression with enteric catheter. No evidence of bowel obstruction or ileus. 5. Trace ascites. 6.  Aortic Atherosclerosis (ICD10-I70.0). Electronically Signed   By: Ozell Daring M.D.   On: 03/14/2024 16:04   DG  Chest Portable 1 View Result Date: 03/14/2024 EXAM: 1 VIEW(S) XRAY OF THE CHEST 03/14/2024 01:09:00 PM COMPARISON: None available. CLINICAL HISTORY: sob FINDINGS: LINES, TUBES AND DEVICES: Right IJ chest port in place with tip terminating in right atrium. LUNGS AND PLEURA: Low lung volumes. Bibasilar airspace opacities. No pulmonary edema. No pleural effusion. No pneumothorax. HEART AND MEDIASTINUM: Atherosclerotic calcifications. No acute abnormality of the cardiac and mediastinal silhouettes. BONES AND SOFT TISSUES: Degenerative changes of the shoulders. No acute osseous abnormality. IMPRESSION: 1. Low lung volumes with bibasilar airspace opacities, which may reflect atelectasis or pneumonia. Electronically signed by: Norleen Boxer MD 03/14/2024 01:48 PM EDT RP Workstation: [id number]   Medications: I have reviewed the patient's current medications. Scheduled Meds:  sodium chloride    Intravenous Once   Chlorhexidine  Gluconate Cloth  6 each Topical Daily   cholestyramine  4 g Oral TID   feeding supplement  237 mL Oral BID BM   loratadine  10 mg Oral Daily   midodrine   10 mg Oral TID WC   pantoprazole   40 mg Oral q morning   potassium chloride  SA  20 mEq Oral Daily   sodium chloride  flush  10-40 mL Intracatheter Q12H   cyanocobalamin  500 mcg Oral Daily   Continuous Infusions:  ceFEPime  (MAXIPIME ) IV Stopped (03/16/24 9361)   dextrose  5% lactated ringers  100 mL/hr at 03/16/24 0830   lactated ringers      metronidazole  500 mg (03/16/24 0616)   norepinephrine (LEVOPHED) Adult infusion 10 mcg/min (03/16/24 0830)   phytonadione (VITAMIN K) 10 mg in dextrose  5 % 50 mL IVPB     potassium chloride  50 mL/hr at 03/16/24 0830   PRN Meds:.acetaminophen  **OR** acetaminophen , furosemide ,  magnesium  hydroxide, morphine  injection, ondansetron  **OR** ondansetron  (ZOFRAN ) IV, sodium chloride  flush, traZODone   Assessment: Principal Problem:   Obstructive jaundice (HCC) Active Problems:   Hypokalemia   BPH (benign prostatic hyperplasia)   GERD without esophagitis   Pancreatic cancer (HCC)  Ryan FORBES Ply 83 y.o. male with a history of colorectal cancer with metastasis recent ERCP 2 weeks back for biliary obstruction with placement of stent readmitted yesterday with features consistent with ascending cholangitis complicated with AKI, shock.  Emergency ERCP performed yesterday stent exchanged.  Since procedure biochemically there is improvement with INR decreasing total bilirubin dropping to 15.4 and white cell count decreasing from 26--->23.  Although biochemically his numbers look better clinically he is still hypotensive likely related to sepsis requiring vasopressor support.  I explained to the family that we need to watch and wait and see how his body responds to the antibiotics and treatment for sepsis despite his lab values looking a bit better than yesterday  Plan: 1.  Continue antibiotics , supportive measures.   LOS: 2 days   Ruel Kung, MD 03/16/2024, 8:45 AM

## 2024-03-16 NOTE — Progress Notes (Signed)
 Pharmacy Antibiotic Note  Jared Gutierrez is a 83 y.o. male admitted on 03/14/2024 with sepsis.  Pharmacy has been consulted for Cefepime  & Vancomycin  dosing.  Plan: Cefepime  2 gm q24hr per indication & renal fxn.  Pt given Vancomycin  2000 mg once on 11/1 @ 0354. Vancomycin  1250 mg IV Q 24 hrs. Goal AUC 400-550. Expected AUC: 502.8 SCr used: 1.31  Follow up culture results to assess for antibiotic optimization. Monitor renal function to assess for any necessary antibiotic dosing changes. Pharmacy will continue to follow and will adjust abx dosing whenever warranted.  Temp (24hrs), Avg:96.5 F (35.8 C), Min:93.7 F (34.3 C), Max:97.8 F (36.6 C)   Recent Labs  Lab 03/12/24 1304 03/14/24 1313 03/14/24 2229 03/15/24 0744 03/15/24 1534 03/15/24 2306 03/16/24 0221 03/16/24 0222  WBC 5.7 18.2*  --  22.4* 25.8*  --  23.4*  --   CREATININE 0.43* 0.78  --  0.73  --  1.31* 1.31*  --   LATICACIDVEN  --   --  2.6*  2.5*  --   --   --   --  3.9*    Estimated Creatinine Clearance: 46.4 mL/min (A) (by C-G formula based on SCr of 1.31 mg/dL (H)).    No Known Allergies  Antimicrobials this admission: 11/01 Cefepime  >>  11/01 Vancomycin  >>  11/01 Flagyl  >> x 7 days 11/01 Ceftriaxone  >> x 1 dose  Microbiology results: 10/31 UCx: Sent   Thank you for allowing pharmacy to be a part of this patient's care.  Rankin CANDIE Dills, PharmD, [facility] 03/16/2024 4:28 AM

## 2024-03-16 NOTE — Plan of Care (Signed)
  Problem: Fluid Volume: Goal: Hemodynamic stability will improve Outcome: Not Progressing Note: Patient started on levophed due to continued hypotension.    Problem: Clinical Measurements: Goal: Signs and symptoms of infection will decrease Outcome: Not Progressing   Problem: Education: Goal: Knowledge of General Education information will improve Description: Including pain rating scale, medication(s)/side effects and non-pharmacologic comfort measures Outcome: Not Progressing   Problem: Health Behavior/Discharge Planning: Goal: Ability to manage health-related needs will improve Outcome: Not Progressing   Problem: Clinical Measurements: Goal: Ability to maintain clinical measurements within normal limits will improve Outcome: Not Progressing Goal: Will remain free from infection Outcome: Not Progressing Goal: Diagnostic test results will improve Outcome: Not Progressing Goal: Respiratory complications will improve Outcome: Progressing Goal: Cardiovascular complication will be avoided Outcome: Not Progressing   Problem: Activity: Goal: Risk for activity intolerance will decrease Outcome: Progressing   Problem: Nutrition: Goal: Adequate nutrition will be maintained Outcome: Not Progressing   Problem: Coping: Goal: Level of anxiety will decrease Outcome: Progressing   Problem: Elimination: Goal: Will not experience complications related to bowel motility Outcome: Progressing Goal: Will not experience complications related to urinary retention Outcome: Not Progressing   Problem: Pain Managment: Goal: General experience of comfort will improve and/or be controlled Outcome: Progressing   Problem: Safety: Goal: Ability to remain free from injury will improve Outcome: Progressing   Problem: Skin Integrity: Goal: Risk for impaired skin integrity will decrease Outcome: Progressing

## 2024-03-16 NOTE — Plan of Care (Signed)
  Problem: Fluid Volume: Goal: Hemodynamic stability will improve Outcome: Progressing   Problem: Clinical Measurements: Goal: Diagnostic test results will improve Outcome: Progressing Goal: Signs and symptoms of infection will decrease Outcome: Progressing   Problem: Respiratory: Goal: Ability to maintain adequate ventilation will improve Outcome: Progressing   Problem: Clinical Measurements: Goal: Ability to maintain clinical measurements within normal limits will improve Outcome: Progressing Goal: Will remain free from infection Outcome: Progressing Goal: Diagnostic test results will improve Outcome: Progressing Goal: Cardiovascular complication will be avoided Outcome: Progressing   Problem: Nutrition: Goal: Adequate nutrition will be maintained Outcome: Progressing   Problem: Coping: Goal: Level of anxiety will decrease Outcome: Progressing   Problem: Pain Managment: Goal: General experience of comfort will improve and/or be controlled Outcome: Progressing   Problem: Skin Integrity: Goal: Risk for impaired skin integrity will decrease Outcome: Progressing

## 2024-03-16 NOTE — Consult Note (Signed)
 NAME:  Jared Gutierrez, MRN:  [id number], DOB:  Jul 11, 1940, LOS: 2 ADMISSION DATE:  03/14/2024, CONSULTATION DATE:  03/16/24 REFERRING MD:  Madison Peaches CHIEF COMPLAINT:  Sepsis with shock   HPI  83 y.o male with PMHx of recurrent colorectal cancer colon with lung progression on chemo, cavitary lung lesion, Anemia, HTN, and BPH, and recent biliary obstruction status post stent who presented to the ED with chief complaints of shortness of breath, constipation with associated nausea and vomiting.  Per chart review, patient has not had a bowel movement in the last 3-4 days and now with worsening abdominal distension, nausea and vomiting.   ED Course: Initial vital signs showed HR of 83 beats/minute, BP 105/32mm Hg, the RR 20 breaths/minute, 100 %O2 Sat on RA and Temp of 98.77F (36.7C).  Pertinent Labs/Diagnostics Findings: Na+/ K+:134/3.0 Glucose:102 BUN/Cr.6/0.78: Calcium :8.6 AST/ALT:121/53 ALKPHOS:278, BILI 18.5 CO2:18  WBC:18.2 K/L Hgb/Hct:8.8/25.3  Lactic acid:2.5  PT/INR:44.4/4.5 MRCP showed severe intrahepatic bile duct dilation with increased abrupt caliber anatomy of the body at the confluence of the cystic duct possible acute cholecystitis. Severely dilated and tortuous main pancreatic duct margin substantially markedly distended stomach.   GI was contacted for biliary obstruction and need for emergent ERCP. Transfer to [facility] tertiary care center was recommended but they declined to accept patient due to elevated INR iso sepsis. Patient was subsequently admitted to TRH service and per further discussion it was opted that the safest route was ERCP vs IR with elevated INR for decompression of the biliary system per Dr. Jinny. Patient was taken to OR for emergent ERCP.  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 11/2: Remained hypotensive post -op requiring pressor. PCCM consulted  Consults:  GI PCCM  Procedures:  11/1:ERCP  Interim History / Subjective:    -  Micro Data:  11/1: Blood culture x2> 11/1: MRSA PCR>>   Antimicrobials:  Vancomycin  11/2> Cefepime  11/2> Metronidazole  11/2> Ceftriaxone  11/1 stopped 11/2  OBJECTIVE  Blood pressure (!) 79/42, pulse 85, temperature (!) 96.6 F (35.9 C), resp. rate 13, height 5' 8 (1.727 m), weight 89.2 kg, SpO2 98%.        Intake/Output Summary (Last 24 hours) at 03/16/2024 0324 Last data filed at 03/16/2024 0200 Gross per 24 hour  Intake 3398.4 ml  Output 2370 ml  Net 1028.4 ml   Filed Weights   03/14/24 1217 03/15/24 1707  Weight: 92.1 kg 89.2 kg    Physical Examination  GEN: Critically ill patient, WDWN in NAD HEENT: [place]/AT. PERRL, sclerae anicteric. HEART: regular rhythm, normal rate, S1, S2, no M/R/G,  LUNGS: CTAB,  no increased WOB,  EXTREMITIES: Bilateral 2+pitting edema NEURO: No gross focal deficits. PSYCH:  Mood and Affect: Mood normal.  ABDOMINAL: Soft: BS x 4, NTND SKIN: Intact, warm, no rashes lesion, or ulcer  Labs/imaging that I havepersonally reviewed  (right click and Reselect all SmartList Selections daily)   DG C-Arm 1-60 Min-No Report Result Date: 03/15/2024 Fluoroscopy was utilized by the requesting physician.  No radiographic interpretation.   DG Abd Portable 1V Result Date: 03/15/2024 EXAM: 1 VIEW XRAY OF THE ABDOMEN 03/15/2024 05:59:00 PM COMPARISON: 04/11/2023 CT 03/14/2024 CLINICAL HISTORY: [id number] Encounter  for imaging study to confirm nasogastric (NG) tube placement [id number] Encounter for imaging study to confirm nasogastric (NG) tube placement FINDINGS: LINES, TUBES AND DEVICES: NG tube tip is in the  mid stomach. Biliary stent noted in place. BOWEL: Gaseous distention of bowel in the right abdomen likely reflects colon. SOFT TISSUES: No opaque urinary calculi. BONES: No acute osseous abnormality. IMPRESSION: 1. NG tube tip in the mid stomach. 2. Gaseous distention of bowel in the right abdomen, likely reflecting colon. Electronically signed by: Franky Crease MD 03/15/2024 06:12 PM EDT RP Workstation: [id number]   MR ABDOMEN MRCP W WO CONTAST Result Date: 03/14/2024 CLINICAL DATA:  Known history of colon and pancreatic cancer with worsening abdominal pain, constipation EXAM: MRI ABDOMEN WITHOUT AND WITH CONTRAST (INCLUDING MRCP) TECHNIQUE: Multiplanar multisequence MR imaging of the abdomen was performed both before and after the administration of intravenous contrast. Heavily T2-weighted images of the biliary and pancreatic ducts were obtained, and three-dimensional MRCP images were rendered by post processing. CONTRAST:  9mL GADAVIST GADOBUTROL 1 MMOL/ML IV SOLN COMPARISON:  Earlier same day CT abdomen and pelvis, MRI abdomen dated 03/04/2024 FINDINGS: Lower chest: No acute findings. Hepatobiliary: Left hepatic atrophy. No focal hepatic lesions. Similar severe intrahepatic bile duct dilation with increased abrupt caliber narrowing of the hepatic duct at the confluence with the cystic duct (14:39). Persistent distended gallbladder now demonstrates circumferential mural thickening. Pancreas: Unchanged severely dilated and tortuous main pancreatic duct upstream of an ill-defined, expansile mass in the pancreatic head and neck measuring approximately 4.1 x 2.5 cm (19:49), not substantially changed from 03/04/2024. Spleen:  Within normal limits in size and appearance. Adrenals/Urinary Tract: No adrenal nodules. No suspicious renal masses identified. No evidence of hydronephrosis. Bilateral simple cysts. Stomach/Bowel: Markedly distended stomach as seen on earlier same day CT. Vascular/Lymphatic: No pathologically  enlarged lymph nodes identified. No abdominal aortic aneurysm demonstrated. Vascular involvement, as before. Other: Small volume ascites and diffuse body wall edema, increased from 03/04/2024. Musculoskeletal: No suspicious bone lesions identified. IMPRESSION: 1. Similar severe intrahepatic bile duct dilation with increased abrupt caliber narrowing of the hepatic duct at the confluence with the cystic duct. 2. Persistent distended gallbladder now demonstrates circumferential mural thickening, which may be reactive or reflect acute cholecystitis. 3. Unchanged severely dilated and tortuous main pancreatic duct upstream of an ill-defined, expansile mass in the pancreatic head and neck measuring approximately 4.1 x 2.5 cm, not substantially changed from 03/04/2024. 4. Markedly distended stomach as seen on earlier same day CT. 5. Small volume ascites and diffuse body wall edema, increased from 03/04/2024. Electronically Signed   By: Limin  Xu M.D.   On: 03/14/2024 20:38   MR 3D Recon At Scanner Result Date: 03/14/2024 CLINICAL DATA:  Known history of colon and pancreatic cancer with worsening abdominal pain, constipation EXAM: MRI ABDOMEN WITHOUT AND WITH CONTRAST (INCLUDING MRCP) TECHNIQUE: Multiplanar multisequence MR imaging of the abdomen was performed both before and after the administration of intravenous contrast. Heavily T2-weighted images of the biliary and pancreatic ducts were obtained, and three-dimensional MRCP images were rendered by post processing. CONTRAST:  9mL GADAVIST GADOBUTROL 1 MMOL/ML IV SOLN COMPARISON:  Earlier same day CT abdomen and pelvis, MRI abdomen dated 03/04/2024 FINDINGS: Lower chest: No acute findings. Hepatobiliary: Left hepatic atrophy. No focal hepatic lesions. Similar severe intrahepatic bile duct dilation with increased abrupt caliber narrowing of the hepatic duct at the confluence with the cystic duct (14:39). Persistent distended gallbladder now demonstrates circumferential  mural thickening. Pancreas: Unchanged severely dilated and tortuous main  pancreatic duct upstream of an ill-defined, expansile mass in the pancreatic head and neck measuring approximately 4.1 x 2.5 cm (19:49), not substantially changed from 03/04/2024. Spleen:  Within normal limits in size and appearance. Adrenals/Urinary Tract: No adrenal nodules. No suspicious renal masses identified. No evidence of hydronephrosis. Bilateral simple cysts. Stomach/Bowel: Markedly distended stomach as seen on earlier same day CT. Vascular/Lymphatic: No pathologically enlarged lymph nodes identified. No abdominal aortic aneurysm demonstrated. Vascular involvement, as before. Other: Small volume ascites and diffuse body wall edema, increased from 03/04/2024. Musculoskeletal: No suspicious bone lesions identified. IMPRESSION: 1. Similar severe intrahepatic bile duct dilation with increased abrupt caliber narrowing of the hepatic duct at the confluence with the cystic duct. 2. Persistent distended gallbladder now demonstrates circumferential mural thickening, which may be reactive or reflect acute cholecystitis. 3. Unchanged severely dilated and tortuous main pancreatic duct upstream of an ill-defined, expansile mass in the pancreatic head and neck measuring approximately 4.1 x 2.5 cm, not substantially changed from 03/04/2024. 4. Markedly distended stomach as seen on earlier same day CT. 5. Small volume ascites and diffuse body wall edema, increased from 03/04/2024. Electronically Signed   By: Limin  Xu M.D.   On: 03/14/2024 20:38   CT ABDOMEN PELVIS W CONTRAST Result Date: 03/14/2024 CLINICAL DATA:  Constipation, short of breath, bilateral lower extremity edema, history of colon cancer, recent stent placement for obstructive jaundice EXAM: CT ABDOMEN AND PELVIS WITH CONTRAST TECHNIQUE: Multidetector CT imaging of the abdomen and pelvis was performed using the standard protocol following bolus administration of intravenous  contrast. RADIATION DOSE REDUCTION: This exam was performed according to the departmental dose-optimization program which includes automated exposure control, adjustment of the mA and/or kV according to patient size and/or use of iterative reconstruction technique. CONTRAST:  OMNIPAQUE  IOHEXOL  300 MG/ML  SOLN COMPARISON:  03/04/2024, 02/27/2024 FINDINGS: Lower chest: No acute pleural or parenchymal lung disease. Stable cystic changes in the right lower lobe with bibasilar bronchiectasis again noted. Hepatobiliary: There is progressive intrahepatic and extrahepatic biliary duct dilation despite interval placement of a biliary stent. The proximal aspect of the stent is within the right common hepatic duct, and distal aspect within the downstream common bile duct. The tip does not appear to extend into the lumen of the duodenum, and may not provide adequate decompression of the biliary tree. Marked distension of the gallbladder with no evidence of acute cholecystitis. No acute parenchymal liver abnormality. Pancreas: Complex cystic and solid mass at the junction of the head and body of the pancreas again noted, measuring approximately 3.1 x 2.7 cm, previously evaluated by MRI. There is upstream pancreatic duct dilation and atrophy of the parenchyma within the distal body and tail the pancreas, stable. No acute inflammatory changes. Spleen: Normal in size without focal abnormality. Adrenals/Urinary Tract: The adrenals are stable. Bilateral renal cortical and peripelvic cysts do not require specific imaging follow-up. The kidneys enhance normally. Bladder is unremarkable. Stomach/Bowel: No bowel obstruction or ileus. Normal appendix right lower quadrant. Postsurgical changes from previous partial colon resection. No bowel wall thickening or acute inflammatory changes. Distended fluid-filled stomach is noted, consideration could be given to decompression with enteric catheter. Vascular/Lymphatic: Aortic  atherosclerosis. 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 No enlarged abdominal or pelvic lymph nodes. Reproductive: Stable enlargement of the prostate. Other: Trace ascites most pronounced in the right upper quadrant and left paracolic gutter. No free intraperitoneal gas.  No abdominal wall hernia. Musculoskeletal: No acute or destructive bony abnormalities. Reconstructed images demonstrate no additional findings. IMPRESSION: 1. Progressive intrahepatic and extrahepatic biliary duct dilation despite interval placement of bile duct stent. The downstream aspect of the biliary stent projects near the ampulla not clearly within the lumen of the duodenum, and may not provide adequate decompression of the biliary tree. 2. Marked gallbladder distension without evidence of cholelithiasis or acute cholecystitis. 3. Stable complex cystic and solid mass within the pancreatic body, with continued extrinsic compression upon the portal vein, upstream pancreatic duct dilation, and atrophy of the pancreatic body and tail. This has been previously evaluated by MRI. 4. Distended fluid-filled stomach, which may require decompression with enteric catheter. No evidence of bowel obstruction or ileus. 5. Trace ascites. 6.  Aortic Atherosclerosis (ICD10-I70.0). Electronically Signed   By: Ozell Daring M.D.   On: 03/14/2024 16:04   DG Chest Portable 1 View Result Date: 03/14/2024 EXAM: 1 VIEW(S) XRAY OF THE CHEST 03/14/2024 01:09:00 PM COMPARISON: None available. CLINICAL HISTORY: sob FINDINGS: LINES, TUBES AND DEVICES: Right IJ chest port in place with tip terminating in right atrium. LUNGS AND PLEURA: Low lung volumes. Bibasilar airspace opacities. No pulmonary edema. No pleural effusion. No pneumothorax. HEART AND MEDIASTINUM: Atherosclerotic calcifications. No acute abnormality of the cardiac and mediastinal silhouettes.  BONES AND SOFT TISSUES: Degenerative changes of the shoulders. No acute osseous abnormality. IMPRESSION: 1. Low lung volumes with bibasilar airspace opacities, which may reflect atelectasis or pneumonia. Electronically signed by: Norleen Boxer MD 03/14/2024 01:48 PM EDT RP Workstation: [id number]     Labs   CBC: Recent Labs  Lab 03/12/24 1304 03/14/24 1313 03/15/24 0744 03/15/24 1534 03/16/24 0221  WBC 5.7 18.2* 22.4* 25.8* 23.4*  NEUTROABS 4.6  --   --   --   --   HGB 8.6* 8.8* 10.0* 10.3* 8.1*  HCT 25.5* 25.3* 28.9* 29.5* 23.5*  MCV 95.1 94.8 93.5 93.7 94.0  PLT 139* 167 165 185 143*    Basic Metabolic Panel: Recent Labs  Lab 03/12/24 1304 03/14/24 1313 03/15/24 0744 03/15/24 2306 03/16/24 0221  NA 130* 134* 138 137 137  K 3.1* 3.0* 3.0* 3.6 2.6*  CL 108 105 104 109 109  CO2 16* 18* 17* 16* 14*  GLUCOSE 117* 102* 71 118* 115*  BUN <5* 6* 11 15 16   CREATININE 0.43* 0.78 0.73 1.31* 1.31*  CALCIUM  8.3* 8.6* 9.0 8.3* 7.8*  MG  --   --   --   --  1.3*   GFR: Estimated Creatinine Clearance: 46.4 mL/min (A) (by C-G formula based on SCr of 1.31 mg/dL (H)). Recent Labs  Lab 03/14/24 1313 03/14/24 2229 03/15/24 0744 03/15/24 1534 03/16/24 0221 03/16/24 0222  WBC 18.2*  --  22.4* 25.8* 23.4*  --   LATICACIDVEN  --  2.6*  2.5*  --   --   --  3.9*    Liver Function Tests: Recent Labs  Lab 03/12/24 1304 03/14/24 1313 03/15/24 0744 03/15/24 2306  AST 63*  63* 121* 168* 249*  ALT 40  38 53* 68* 89*  ALKPHOS 213*  214* 278* 241* 183*  BILITOT 13.9*  13.9* 18.5* 18.6* 16.2*  PROT 5.3*  5.3* 5.5* 5.5* 4.9*  ALBUMIN 2.2*  2.1* 2.1* 2.0* 1.9*   Recent Labs  Lab 03/14/24 1313  LIPASE 23   No results for input(s): AMMONIA in the last 168 hours.  ABG No results found for: PHART, PCO2ART, PO2ART, HCO3, TCO2, ACIDBASEDEF, O2SAT   Coagulation  Profile: Recent Labs  Lab 03/15/24 0744  INR 4.5*    Cardiac Enzymes: No results for input(s):  CKTOTAL, CKMB, CKMBINDEX, TROPONINI in the last 168 hours.  HbA1C: Hemoglobin A1C  Date/Time Value Ref Range Status  05/22/2011 06:49 AM 5.3 4.2 - 6.3 % Final    Comment:    The American Diabetes Association recommends that a primary goal of therapy should be <7% and that physicians should reevaluate the treatment regimen in patients with HbA1c values consistently >8%.    Hgb A1c MFr Bld  Date/Time Value Ref Range Status  04/19/2021 08:32 AM 5.3 4.8 - 5.6 % Final    Comment:    (NOTE)         Prediabetes: 5.7 - 6.4         Diabetes: >6.4         Glycemic control for adults with diabetes: <7.0    CBG: Recent Labs  Lab 03/15/24 1708 03/15/24 1754 03/15/24 2009 03/15/24 2316  GLUCAP 20* 78 55* 113*    Review of Systems:   Unable to be obtained secondary to the patient poor historian   Past Medical History  He,  has a past medical history of Arthritis, Cancer (HCC), Cavitary lesion of lung, Chicken pox, Colon cancer (HCC), History of kidney stones, Hypertension, Lipoma of colon, Nephrolithiasis, Nephrolithiasis, Obesity, Shingles, and Tubular adenoma of colon.   Surgical History    Past Surgical History:  Procedure Laterality Date   BILIARY STENT PLACEMENT  03/04/2024   Procedure: INSERTION, STENT, BILE DUCT;  Surgeon: Jinny Carmine, MD;  Location: [facility] ENDOSCOPY;  Service: Endoscopy;;   COLON SURGERY     COLONOSCOPY N/A 10/02/2014   Procedure: COLONOSCOPY;  Surgeon: Donnice Vaughn Manes, MD;  Location: [facility] ENDOSCOPY;  Service: Endoscopy;  Laterality: N/A;   COLONOSCOPY WITH PROPOFOL  N/A 02/16/2017   Procedure: COLONOSCOPY WITH PROPOFOL ;  Surgeon: Viktoria Lamar DASEN, MD;  Location: [facility] ENDOSCOPY;  Service: Endoscopy;  Laterality: N/A;   COLONOSCOPY WITH PROPOFOL  N/A 10/05/2020   Procedure: COLONOSCOPY WITH PROPOFOL ;  Surgeon: Maryruth Ole DASEN, MD;  Location: [facility] ENDOSCOPY;  Service: Endoscopy;  Laterality: N/A;   ERCP N/A 03/04/2024   Procedure: ERCP, WITH  INTERVENTION IF INDICATED;  Surgeon: Jinny Carmine, MD;  Location: [facility] ENDOSCOPY;  Service: Endoscopy;  Laterality: N/A;   ESOPHAGOGASTRODUODENOSCOPY N/A 12/31/2023   Procedure: EGD (ESOPHAGOGASTRODUODENOSCOPY);  Surgeon: Wilhelmenia Aloha Raddle., MD;  Location: [facility];  Service: Gastroenterology;  Laterality: N/A;   EUS N/A 04/17/2019   Procedure: FULL UPPER ENDOSCOPIC ULTRASOUND (EUS) RADIAL;  Surgeon: Queenie Asberry LABOR, MD;  Location: [facility] ENDOSCOPY;  Service: Gastroenterology;  Laterality: N/A;   EUS N/A 12/31/2023   Procedure: ULTRASOUND, UPPER GI TRACT, ENDOSCOPIC;  Surgeon: Wilhelmenia Aloha Raddle., MD;  Location: [facility] ENDOSCOPY;  Service: Gastroenterology;  Laterality: N/A;   FINE NEEDLE ASPIRATION  12/31/2023   Procedure: FINE NEEDLE ASPIRATION;  Surgeon: Wilhelmenia Aloha Raddle., MD;  Location: [facility] ENDOSCOPY;  Service: Gastroenterology;;   KIDNEY STONE SURGERY     PARTIAL COLECTOMY  10/17/2013   PORTACATH PLACEMENT Right 06/13/2019   Procedure: INSERTION PORT-A-CATH;  Surgeon: Tye Millet, DO;  Location: [facility] ORS;  Service: General;  Laterality: Right;     Social History   reports that he has never smoked. He has never used smokeless tobacco. He reports that he does not currently use alcohol. He reports that he does not use drugs.   Family History   His family history includes Breast cancer in his mother; COPD in his father;  Cancer in his mother.   Allergies No Known Allergies   Home Medications  Prior to Admission medications   Medication Sig Start Date End Date Taking? Authorizing Provider  cholestyramine (QUESTRAN) 4 g packet Take 1 packet (4 g total) by mouth 3 (three) times daily for 14 days. 03/06/24 03/20/24 Yes Agbata, Tochukwu, MD  cyanocobalamin (VITAMIN B12) 500 MCG tablet Take by mouth. 07/26/23 07/25/24 Yes [provider]  feeding supplement (ENSURE PLUS HIGH PROTEIN) LIQD Take 237 mLs by mouth 2 (two) times daily between meals. 03/06/24 04/05/24 Yes Agbata,  Tochukwu, MD  furosemide  (LASIX ) 20 MG tablet Take 1 tablet (20 mg total) by mouth daily as needed for edema. 03/12/24  Yes Babara Call, MD  omeprazole  (PRILOSEC) 20 MG capsule Take 20 mg by mouth daily. 12/25/23  Yes [provider]  ondansetron  (ZOFRAN ) 8 MG tablet Take 1 tablet (8 mg total) by mouth 2 (two) times daily as needed for refractory nausea / vomiting. Start on day 3 after chemotherapy. 11/16/20  Yes Babara Call, MD  pantoprazole  (PROTONIX ) 40 MG tablet Take 1 tablet (40 mg total) by mouth every morning. Take in the morning on an empty stomach 12/07/23 03/15/24 Yes Sreenath, Sudheer B, MD  potassium chloride  SA (KLOR-CON  M) 20 MEQ tablet Take 1 tablet (20 mEq total) by mouth daily. 03/12/24  Yes Babara Call, MD  Probiotic Product (PROBIOTIC DAILY PO) Take 1 tablet by mouth daily.   Yes [provider]  tamsulosin  (FLOMAX ) 0.4 MG CAPS capsule Take 0.4 mg by mouth daily after supper. 03/03/24  Yes [provider]  lidocaine -prilocaine  (EMLA ) cream Apply small amount to port and cover with saran wrap 1-2 hours prior to port access Patient not taking: Reported on 03/15/2024 08/27/23   Babara Call, MD  loratadine (CLARITIN) 10 MG tablet Take 10 mg by mouth daily. Patient not taking: Reported on 03/15/2024    [provider]  Scheduled Meds:  sodium chloride    Intravenous Once   Chlorhexidine  Gluconate Cloth  6 each Topical Daily   cholestyramine  4 g Oral TID   feeding supplement  237 mL Oral BID BM   loratadine  10 mg Oral Daily   midodrine   10 mg Oral TID WC   pantoprazole   40 mg Oral q morning   potassium chloride  SA  20 mEq Oral Daily   sodium chloride  flush  10-40 mL Intracatheter Q12H   cyanocobalamin  500 mcg Oral Daily   Continuous Infusions:  cefTRIAXone  (ROCEPHIN )  IV Stopped (03/15/24 1317)   dextrose  5% lactated ringers  100 mL/hr at 03/15/24 1812   lactated ringers      magnesium  sulfate bolus IVPB 50 mL/hr at 03/16/24 0340   metronidazole  Stopped  (03/15/24 1901)   norepinephrine (LEVOPHED) Adult infusion 8 mcg/min (03/16/24 0340)   phytonadione (VITAMIN K) 10 mg in dextrose  5 % 50 mL IVPB     potassium chloride  50 mL/hr at 03/16/24 0340   PRN Meds:.acetaminophen  **OR** acetaminophen , furosemide , magnesium  hydroxide, morphine  injection, ondansetron  **OR** ondansetron  (ZOFRAN ) IV, sodium chloride  flush, traZODone  Active Hospital Problem list   See systems below  Assessment & Plan:  #Sepsis Shock with Multiorgan Failure due to Acute Ascending Cholangitis  Lactate worsening 2.5>2.6>3.9 after IVF resuscitation -F/u cultures, trend lactic/ PCT -Monitor WBC/ fever curve -Broaden IV antibiotics with Cefepime , Vancomycin  and Flagyl  -IVF hydration as needed -Pressors  for MAP goal >65 -Strict I/O's   #Acute Cholangitis  #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  s/p  ERCP with stent on 12/03/23  s/p ERCP 03/15/24 with removal and exchange of stent from and into the CBD -Bowel regimen -Zofran  for nausea -PPI ppx with IV protonix  40mg  -GI following, appreciate intput  #AKI likely iso above #AGMA with Lactic Acidosis #Hypokalemia Cr 1.31 (baseline <0.3-0.78) -Follow BMP+Mag -Ensure adequate renal perfusion -Avoid nephrotoxic  -Replace lytes -strict I&O  #Coagulopathy likely Biliary obstruction induced #Thrombocytopenia #Anemia PT/INR 44.4/4.5 H/H:8.1/23.5 drop from 10.3/29.5 Received Vitamin K x 1 and 2 units of FFP thus far -trend CBC, PT/INR -Monitor for s/sx of bleeding  -Transfuse for hgb <7 and/or platelet count <20,000   #Transaminitis Elevated LFTs  iso Ascending cholangitis/shock Liver -Avoid hepatoxic agents -Follow hepatic panel     Best practice:  Diet:  Oral Pain/Anxiety/Delirium protocol (if indicated): No VAP protocol (if indicated): Not indicated DVT prophylaxis: Contraindicated GI prophylaxis: PPI Glucose control:  SSI No Central venous access:   N/A Arterial line:  Yes, and it is still needed Foley:  Yes, and it is still needed Mobility:  bed rest  PT/OT consulted: N/A Code Status:  limited Disposition: ICU   = Goals of Care =  Primary Emergency Contact: LELDON, STEEGE, Home Phone: [phone]   Critical care time: 55 minutes        Almarie Nose DNP, CCRN, FNP-C, AGACNP-BC Acute Care & Family Nurse Practitioner  Pulmonary & Critical Care Medicine PCCM on call pager [phone]

## 2024-03-16 NOTE — Progress Notes (Signed)
 eLink Physician-Brief Progress Note Patient Name: KYSTON GONCE DOB: August 24, 1940 MRN: [id number]   Date of Service  03/16/2024  HPI/Events of Note  83/M with history of recurrent colon and pancreactic CA, recent biliary stent placement (10/21) currently admitted for obstructive jaundice, suspected ascending cholangitis. He underwent emergent ERCP last night(11/1). 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 Prior stent in CBD was removed and replaced with a new plastic stent. Post procedure, pt had been hypotensive, requiring vasopressor support. He was then transferred to ICU for continuation of care.   Labs and imaging briefly reviewed  eICU Interventions  - Transfer to ICU - On empiric antibiotics for cholangitis. Will broaden antibiotic combination to Cefepime  +metronidazole  +vancomycin .  - Continue IVF. Monitor I/Os closely.  - Started on levophed. Will titrate to target MAP >65 - If with high levophed requirement, will plan to add vasopressin.  - Will follow WBC, lactate, as well as LFTs.  - Pain control.  - Maintain nomoglycemia - Guarded prognosis - Code Status: DNR/DNI        Gordie Crumby M DELA CRUZ 03/16/2024, 4:24 AM

## 2024-03-16 NOTE — Progress Notes (Signed)
   03/16/24 1630  Spiritual Encounters  Type of Visit Initial  Care provided to: Family  Referral source Chaplain assessment  Reason for visit Routine spiritual support  Interventions  Spiritual Care Interventions Made Established relationship of care and support;Compassionate presence  Intervention Outcomes  Outcomes Connection to spiritual care;Awareness of support  Spiritual Care Plan  Spiritual Care Issues Still Outstanding No further spiritual care needs at this time (see row info)   Chaplain spoke to wife and daughter in ICU waiting and offered encouragement

## 2024-03-17 ENCOUNTER — Inpatient Hospital Stay

## 2024-03-17 ENCOUNTER — Telehealth: Payer: Self-pay | Admitting: Oncology

## 2024-03-17 ENCOUNTER — Encounter: Payer: Self-pay | Admitting: Gastroenterology

## 2024-03-17 ENCOUNTER — Other Ambulatory Visit: Payer: Self-pay

## 2024-03-17 DIAGNOSIS — E43 Unspecified severe protein-calorie malnutrition: Secondary | ICD-10-CM | POA: Insufficient documentation

## 2024-03-17 LAB — CBC WITH DIFFERENTIAL/PLATELET
Abs Immature Granulocytes: 0.17 K/uL — ABNORMAL HIGH (ref 0.00–0.07)
Basophils Absolute: 0 K/uL (ref 0.0–0.1)
Basophils Relative: 0 %
Eosinophils Absolute: 0 K/uL (ref 0.0–0.5)
Eosinophils Relative: 0 %
HCT: 19.4 % — ABNORMAL LOW (ref 39.0–52.0)
Hemoglobin: 6.8 g/dL — ABNORMAL LOW (ref 13.0–17.0)
Immature Granulocytes: 1 %
Lymphocytes Relative: 2 %
Lymphs Abs: 0.2 K/uL — ABNORMAL LOW (ref 0.7–4.0)
MCH: 32.2 pg (ref 26.0–34.0)
MCHC: 35.1 g/dL (ref 30.0–36.0)
MCV: 91.9 fL (ref 80.0–100.0)
Monocytes Absolute: 0.4 K/uL (ref 0.1–1.0)
Monocytes Relative: 3 %
Neutro Abs: 13.1 K/uL — ABNORMAL HIGH (ref 1.7–7.7)
Neutrophils Relative %: 94 %
Platelets: 110 K/uL — ABNORMAL LOW (ref 150–400)
RBC: 2.11 MIL/uL — ABNORMAL LOW (ref 4.22–5.81)
RDW: 17.2 % — ABNORMAL HIGH (ref 11.5–15.5)
Smear Review: NORMAL
WBC: 13.9 K/uL — ABNORMAL HIGH (ref 4.0–10.5)
nRBC: 0 % (ref 0.0–0.2)

## 2024-03-17 LAB — BPAM FFP
Blood Product Expiration Date: 202511062359
Blood Product Expiration Date: 202511062359
ISSUE DATE / TIME: 202511011828
Unit Type and Rh: 8400
Unit Type and Rh: 8400

## 2024-03-17 LAB — HEMOGLOBIN AND HEMATOCRIT, BLOOD
HCT: 25.1 % — ABNORMAL LOW (ref 39.0–52.0)
Hemoglobin: 8.8 g/dL — ABNORMAL LOW (ref 13.0–17.0)

## 2024-03-17 LAB — PREPARE FRESH FROZEN PLASMA: Unit division: 0

## 2024-03-17 LAB — BASIC METABOLIC PANEL WITH GFR
Anion gap: 8 (ref 5–15)
BUN: 21 mg/dL (ref 8–23)
CO2: 16 mmol/L — ABNORMAL LOW (ref 22–32)
Calcium: 8.1 mg/dL — ABNORMAL LOW (ref 8.9–10.3)
Chloride: 109 mmol/L (ref 98–111)
Creatinine, Ser: 1.32 mg/dL — ABNORMAL HIGH (ref 0.61–1.24)
GFR, Estimated: 54 mL/min — ABNORMAL LOW (ref >60)
Glucose, Bld: 154 mg/dL — ABNORMAL HIGH (ref 70–99)
Potassium: 3.3 mmol/L — ABNORMAL LOW (ref 3.5–5.1)
Sodium: 133 mmol/L — ABNORMAL LOW (ref 135–145)

## 2024-03-17 LAB — PROTIME-INR
INR: 2.5 — ABNORMAL HIGH (ref 0.8–1.2)
Prothrombin Time: 27.8 s — ABNORMAL HIGH (ref 11.4–15.2)

## 2024-03-17 LAB — HEPATIC FUNCTION PANEL
ALT: 59 U/L — ABNORMAL HIGH (ref 0–44)
AST: 109 U/L — ABNORMAL HIGH (ref 15–41)
Albumin: 2 g/dL — ABNORMAL LOW (ref 3.5–5.0)
Alkaline Phosphatase: 110 U/L (ref 38–126)
Bilirubin, Direct: 7.7 mg/dL — ABNORMAL HIGH (ref 0.0–0.2)
Indirect Bilirubin: 6.3 mg/dL — ABNORMAL HIGH (ref 0.3–0.9)
Total Bilirubin: 14 mg/dL — ABNORMAL HIGH (ref 0.0–1.2)
Total Protein: 4.4 g/dL — ABNORMAL LOW (ref 6.5–8.1)

## 2024-03-17 LAB — TECHNOLOGIST SMEAR REVIEW: Plt Morphology: NORMAL

## 2024-03-17 LAB — GLUCOSE, CAPILLARY
Glucose-Capillary: 108 mg/dL — ABNORMAL HIGH (ref 70–99)
Glucose-Capillary: 152 mg/dL — ABNORMAL HIGH (ref 70–99)
Glucose-Capillary: 71 mg/dL (ref 70–99)
Glucose-Capillary: 83 mg/dL (ref 70–99)
Glucose-Capillary: 90 mg/dL (ref 70–99)
Glucose-Capillary: 92 mg/dL (ref 70–99)
Glucose-Capillary: 93 mg/dL (ref 70–99)

## 2024-03-17 LAB — PHOSPHORUS: Phosphorus: 3 mg/dL (ref 2.5–4.6)

## 2024-03-17 LAB — PREPARE RBC (CROSSMATCH)

## 2024-03-17 LAB — LIPASE, BLOOD: Lipase: 19 U/L (ref 11–51)

## 2024-03-17 LAB — MAGNESIUM
Magnesium: 2.1 mg/dL (ref 1.7–2.4)
Magnesium: 2.2 mg/dL (ref 1.7–2.4)

## 2024-03-17 LAB — POTASSIUM
Potassium: 3.5 mmol/L (ref 3.5–5.1)
Potassium: 3.7 mmol/L (ref 3.5–5.1)

## 2024-03-17 LAB — FIBRINOGEN: Fibrinogen: 359 mg/dL (ref 210–475)

## 2024-03-17 MED ORDER — TRAZODONE HCL 50 MG PO TABS: 25.0000 mg | ORAL_TABLET | Freq: Every evening | ORAL | Status: DC | PRN

## 2024-03-17 MED ORDER — IOHEXOL 9 MG/ML PO SOLN
500.0000 mL | ORAL | Status: AC
  Administered 2024-03-17 (×2): 500 mL via ORAL

## 2024-03-17 MED ORDER — ACETAMINOPHEN 325 MG PO TABS: 650.0000 mg | ORAL_TABLET | Freq: Four times a day (QID) | ORAL | Status: DC | PRN

## 2024-03-17 MED ORDER — ONDANSETRON HCL 4 MG PO TABS: 4.0000 mg | ORAL_TABLET | Freq: Four times a day (QID) | ORAL | Status: DC | PRN

## 2024-03-17 MED ORDER — DEXTROSE IN LACTATED RINGERS 5 % IV SOLN: INTRAVENOUS | Status: DC

## 2024-03-17 MED ORDER — FUROSEMIDE 10 MG/ML IJ SOLN
40.0000 mg | Freq: Every day | INTRAMUSCULAR | Status: DC
  Administered 2024-03-17 – 2024-03-18 (×2): 40 mg via INTRAVENOUS
  Filled 2024-03-17 (×2): qty 4

## 2024-03-17 MED ORDER — SODIUM CHLORIDE 0.9% IV SOLUTION: Freq: Once | INTRAVENOUS | Status: AC

## 2024-03-17 MED ORDER — HYDROCORTISONE SOD SUC (PF) 100 MG IJ SOLR
50.0000 mg | Freq: Three times a day (TID) | INTRAMUSCULAR | Status: DC
  Administered 2024-03-17 – 2024-03-20 (×9): 50 mg via INTRAVENOUS
  Filled 2024-03-17 (×9): qty 2

## 2024-03-17 MED ORDER — ACETAMINOPHEN 650 MG RE SUPP: 650.0000 mg | Freq: Four times a day (QID) | RECTAL | Status: DC | PRN

## 2024-03-17 MED ORDER — POTASSIUM CHLORIDE 10 MEQ/50ML IV SOLN
10.0000 meq | INTRAVENOUS | Status: AC
  Administered 2024-03-17 (×2): 10 meq via INTRAVENOUS
  Filled 2024-03-17 (×2): qty 50

## 2024-03-17 MED ORDER — POTASSIUM CHLORIDE 10 MEQ/50ML IV SOLN
10.0000 meq | INTRAVENOUS | Status: AC
  Administered 2024-03-17 (×3): 10 meq via INTRAVENOUS
  Filled 2024-03-17 (×3): qty 50

## 2024-03-17 MED ORDER — ONDANSETRON HCL 4 MG/2ML IJ SOLN: 4.0000 mg | Freq: Four times a day (QID) | INTRAMUSCULAR | Status: DC | PRN

## 2024-03-17 MED ORDER — DEXTROSE 50 % IV SOLN
1.0000 | Freq: Once | INTRAVENOUS | Status: AC
  Administered 2024-03-17: 50 mL via INTRAVENOUS
  Filled 2024-03-17: qty 50

## 2024-03-17 MED ORDER — AQUAPHOR EX OINT
TOPICAL_OINTMENT | CUTANEOUS | Status: DC | PRN
  Filled 2024-03-17: qty 50

## 2024-03-17 NOTE — Progress Notes (Signed)
 Chaplain provided Jared Gutierrez, wife of 58 years and only daughter spiritual support. All thankful for prayers. Family reports they have a supportive system in place offering care at home.     03/17/24 1700  Spiritual Encounters  Type of Visit Follow up  Care provided to: Pt and family  Referral source Chaplain team  Reason for visit Routine spiritual support  OnCall Visit Yes

## 2024-03-17 NOTE — Anesthesia Postprocedure Evaluation (Signed)
 Anesthesia Post Note  Patient: Jared Gutierrez  Procedure(s) Performed: ERCP, WITH INTERVENTION IF INDICATED STENT REMOVAL INSERTION, STENT, BILE DUCT  Patient location during evaluation: ICU Anesthesia Type: General Level of consciousness: awake, awake and alert and oriented Pain management: pain level controlled Vital Signs Assessment: post-procedure vital signs reviewed and stable Respiratory status: spontaneous breathing and nonlabored ventilation Cardiovascular status: blood pressure returned to baseline and stable Postop Assessment: no apparent nausea or vomiting Anesthetic complications: no   No notable events documented.   Last Vitals:  Vitals:   03/17/24 0600 03/17/24 0700  BP:    Pulse: 72 69  Resp: 13 (!) 8  Temp: (!) 36.3 C (!) 36.2 C  SpO2: 95% 95%    Last Pain:  Vitals:   03/17/24 0600  TempSrc: Rectal  PainSc:                  [name]

## 2024-03-17 NOTE — Telephone Encounter (Signed)
 Error. Another phone note documented

## 2024-03-17 NOTE — Progress Notes (Signed)
 Initial Nutrition Assessment  DOCUMENTATION CODES:   Severe malnutrition in context of chronic illness  INTERVENTION:   Recommend TPN initiation if prolonged NPO is expected.   Pt at high refeed risk; recommend monitor potassium, magnesium  and phosphorus labs daily until stable  Daily weights   NUTRITION DIAGNOSIS:   Severe Malnutrition related to cancer and cancer related treatments as evidenced by severe fat depletion, severe muscle depletion.  GOAL:   Patient will meet greater than or equal to 90% of their needs  MONITOR:   Diet advancement, Labs, Weight trends, Skin, I & O's  REASON FOR ASSESSMENT:   Malnutrition Screening Tool    ASSESSMENT:   83 y/o male with h/o HTN, kidney stones, colon mass s/p hemicolectomy (2015) with recurrence with pancreatic metastases s/p chemoradiation (2021), hepatic steatosis, B12 deficiency, BPH and recent admission for biliary obstruction secondary to metastatic disease s/p ERCP with stent placement 10/21 and who is now admitted with acute cholangitis, septic shock, AKI and biliary obstruction secondary to bile duct strictures s/p ERCP with stent replacement 11/1.  Met with pt and family in room today. Pt lethargic and does not provide any history. Family at bedside reports pt with fair appetite and oral intake pta. Family reports that pt's oral intake waxes and wanes; pt has good and bad days. Family reports that patient has been drinking Boost/Ensure at home ([employer] Boost/Ensure). Pt also takes liquid IV at home. Pt is followed by the RD at the [facility]. Family reports that patient has lost down from 268lbs since his cancer diagnosis in 2023. Per chart, pt does appear weight stable for the past 6 months. Pt currently NPO. NGT in place to LIS with output. KUB reporting possible ileus. Pt is non-distended with minimal NGT output. No BM yet. Would recommend TPN initiation if prolonged NPO is expected. Will  plan to initiate tube feeds once to per MD. Pt is at high refeed risk.   Medications reviewed and include: solu-medrol , midodrine , protonix , cefepime , metronidazole , vasopressin   Labs reviewed: Na 133(L), K 3.3(L), creat 1.32(H), P 3.0 wnl, Mg 2.1 wnl, tbili 14.0(H) Lipase- 19 Wbc- 13.9(H), Hgb 6.8(L), Hct 19.4(L) Cbgs- 90, 108, 152 x 24 hrs   UOP-   NUTRITION - FOCUSED PHYSICAL EXAM:  Flowsheet Row Most Recent Value  Orbital Region Mild depletion  Upper Arm Region Severe depletion  Thoracic and Lumbar Region Severe depletion  Buccal Region Mild depletion  Temple Region Moderate depletion  Clavicle Bone Region Severe depletion  Clavicle and Acromion Bone Region Severe depletion  Scapular Bone Region Moderate depletion  Dorsal Hand Moderate depletion  Patellar Region Moderate depletion  Anterior Thigh Region Mild depletion  Posterior Calf Region Unable to assess  Edema (RD Assessment) Moderate  Hair Reviewed  Eyes Reviewed  Mouth Reviewed  Skin Reviewed  Nails Reviewed   Diet Order:   Diet Order             Diet NPO time specified  Diet effective now                  EDUCATION NEEDS:   No education needs have been identified at this time  Skin:  Skin Assessment: Reviewed RN Assessment (ecchymosis, jaundice)  Last BM:  10/28  Height:   Ht Readings from Last 1 Encounters:  03/14/24 5' 8 (1.727 m)    Weight:   Wt Readings from Last 1 Encounters:  03/15/24 89.2 kg    Ideal Body  Weight:  70 kg  BMI:  Body mass index is 29.9 kg/m.  Estimated Nutritional Needs:   Kcal:  2000-2300kcal/day  Protein:  100-115g/day  Fluid:  1.8-2.1L/day  Augustin Shams MS, RD, LDN If unable to be reached, please send secure chat to RD inpatient available from 8:00a-4:00p daily

## 2024-03-17 NOTE — Progress Notes (Signed)
 Rogelia Copping, MD [facility]   [address], [address] [place], [place] [address] Phone: [phone] Fax : [phone]   Subjective: Patient is status post emergency ERCP this weekend.  The ERCP was complicated by preprocedure withdrawal of 1.9 L of fluid from his stomach with what appeared to be PEA postintubation with rapid recovery from that with the procedure proceeding with the stent change.  The patient's white cell count has significantly increased with the liver enzymes improving and the bilirubin slowly trending down. The patient remains on pressors despite his improvement in his liver enzymes and white cell count.   Objective: Vital signs in last 24 hours: Vitals:   03/17/24 0500 03/17/24 0523 03/17/24 0600 03/17/24 0700  BP:      Pulse: 69 70 72 69  Resp: (!) 9 10 13  (!) 8  Temp: 97.7 F (36.5 C) (!) 97.5 F (36.4 C) (!) 97.3 F (36.3 C) (!) 97.2 F (36.2 C)  TempSrc:   Rectal   SpO2: 97% 97% 95% 95%  Weight:      Height:       Weight change:   Intake/Output Summary (Last 24 hours) at 03/17/2024 9160 Last data filed at 03/17/2024 9351 Gross per 24 hour  Intake 4388.42 ml  Output 395 ml  Net 3993.42 ml     Exam: Heart:: Regular rate and rhythm or without murmur or extra heart sounds Lungs: normal and clear to auscultation and percussion Abdomen: soft, nontender, normal bowel sounds   Lab Results: @LABTEST2 @ Micro Results: Recent Results (from the past 240 hours)  Urine Culture (for pregnant, neutropenic or urologic patients or patients with an indwelling urinary catheter)     Status: None   Collection Time: 03/14/24  3:26 PM   Specimen: Urine, Catheterized  Result Value Ref Range Status   Specimen Description   Final    URINE, CATHETERIZED Performed at [facility], [address], [place], [place] [address]    Special Requests   Final    NONE Performed at [facility], [address] [place], [place] [address]    Culture    Final    NO GROWTH Performed at [facility], [address] [place], [place] [address]    Report Status 03/16/2024 FINAL  Final  MRSA Next Gen by PCR, Nasal     Status: None   Collection Time: 03/15/24  5:08 PM   Specimen: Nasal Mucosa; Nasal Swab  Result Value Ref Range Status   MRSA by PCR Next Gen NOT DETECTED NOT DETECTED Final    Comment: (NOTE) The GeneXpert MRSA Assay (FDA approved for NASAL specimens only), is one component of a comprehensive MRSA colonization surveillance program. It is not intended to diagnose MRSA infection nor to guide or monitor treatment for MRSA infections. Test performance is not FDA approved in patients less than 42 years old. Performed at [facility], [address] [place], [place] [address]   Culture, blood (Routine X 2) w Reflex to ID Panel     Status: None (Preliminary result)   Collection Time: 03/16/24 12:14 PM   Specimen: BLOOD LEFT HAND  Result Value Ref Range Status   Specimen Description BLOOD LEFT HAND  Final   Special Requests   Final    BOTTLES DRAWN AEROBIC AND ANAEROBIC Blood Culture adequate volume   Culture   Final    NO GROWTH < 24 HOURS Performed at [facility], [address] [place], [place] [address]  Report Status PENDING  Incomplete  Culture, blood (Routine X 2) w Reflex to ID Panel     Status: None (Preliminary result)   Collection Time: 03/16/24 12:21 PM   Specimen: BLOOD LEFT ARM  Result Value Ref Range Status   Specimen Description BLOOD LEFT ARM  Final   Special Requests   Final    BOTTLES DRAWN AEROBIC AND ANAEROBIC Blood Culture adequate volume   Culture   Final    NO GROWTH < 24 HOURS Performed at [facility], [address], [place], [place] [address]    Report Status PENDING  Incomplete   Studies/Results: DG Abd 1 View Result Date: 03/16/2024 CLINICAL DATA:  Nasogastric tube placement. EXAM: DG ABDOMEN 1V COMPARISON:  03/15/2024 FINDINGS: Nasogastric tube  shows a loop within the gastric fundus, with distal tip overlying the gastric body. Gaseous distension of small bowel and transverse colon noted, likely due to ileus. IMPRESSION: Nasogastric tube tip overlies the gastric body. Probable ileus. Electronically Signed   By: Norleen DELENA Kil M.D.   On: 03/16/2024 05:43   DG C-Arm 1-60 Min-No Report Result Date: 03/15/2024 Fluoroscopy was utilized by the requesting physician.  No radiographic interpretation.   DG Abd Portable 1V Result Date: 03/15/2024 EXAM: 1 VIEW XRAY OF THE ABDOMEN 03/15/2024 05:59:00 PM COMPARISON: 04/11/2023 CT 03/14/2024 CLINICAL HISTORY: [id number] Encounter for imaging study to confirm nasogastric (NG) tube placement [id number] Encounter for imaging study to confirm nasogastric (NG) tube placement FINDINGS: LINES, TUBES AND DEVICES: NG tube tip is in the mid stomach. Biliary stent noted in place. BOWEL: Gaseous distention of bowel in the right abdomen likely reflects colon. SOFT TISSUES: No opaque urinary calculi. BONES: No acute osseous abnormality. IMPRESSION: 1. NG tube tip in the mid stomach. 2. Gaseous distention of bowel in the right abdomen, likely reflecting colon. Electronically signed by: Kevin Dover MD 03/15/2024 06:12 PM EDT RP Workstation: [id number]   Medications: I have reviewed the patient's current medications. Scheduled Meds:  Chlorhexidine  Gluconate Cloth  6 each Topical Daily   feeding supplement  237 mL Oral BID BM   hydrocortisone sod succinate (SOLU-CORTEF) inj  100 mg Intravenous Q8H   midodrine   10 mg Per Tube TID WC   pantoprazole  (PROTONIX ) IV  40 mg Intravenous Daily   sodium chloride  flush  10-40 mL Intracatheter Q12H   Continuous Infusions:  ceFEPime  (MAXIPIME ) IV Stopped (03/17/24 9380)   metronidazole  500 mg (03/17/24 0545)   norepinephrine (LEVOPHED) Adult infusion Stopped (03/16/24 2007)   phytonadione (VITAMIN K) 10 mg in dextrose  5 % 50 mL IVPB 10 mg (03/17/24 0817)   potassium chloride  10 mEq  (03/17/24 0754)   vasopressin Stopped (03/17/24 0425)   PRN Meds:.acetaminophen  **OR** acetaminophen , magnesium  hydroxide, mineral oil-hydrophilic petrolatum, morphine  injection, ondansetron  **OR** ondansetron  (ZOFRAN ) IV, sodium chloride  flush, traZODone   Assessment: Principal Problem:   Obstructive jaundice (HCC) Active Problems:   Hypokalemia   BPH (benign prostatic hyperplasia)   GERD without esophagitis   Pancreatic cancer (HCC)    Plan: This patient is status post ERCP with stent exchange for cholangitis with an improvement in the white cell count and INR that has come down to 2.5 from 4.5.  The patient's bilirubin has not decreased is much as I would have liked although it is trending down as are the other liver enzymes.  It appears that the procedure was also marred by preprocedure PEA after intubation which quickly responded to treatment. The patient continues to remain in the ICU.  Patient has  a poor overall prognosis but is improving during this hospitalization.  I would recommend continued supportive care.   LOS: 3 days   Rogelia Copping, MD.FACG 03/17/2024, 8:39 AM Pager [phone] 7am-5pm  Check AMION for 5pm -7am coverage and on weekends

## 2024-03-17 NOTE — Progress Notes (Signed)
 PHARMACY CONSULT NOTE - FOLLOW UP  Pharmacy Consult for Electrolyte Monitoring and Replacement   Recent Labs: Potassium (mmol/L)  Date Value  03/17/2024 3.3 (L)  04/27/2014 3.7   Magnesium  (mg/dL)  Date Value  88/96/7974 2.1  05/22/2011 2.2   Calcium  (mg/dL)  Date Value  88/96/7974 8.1 (L)   Calcium , Total (mg/dL)  Date Value  87/85/7984 9.4   Albumin (g/dL)  Date Value  88/96/7974 2.0 (L)  04/27/2014 3.3 (L)   Phosphorus (mg/dL)  Date Value  88/96/7974 3.0   Sodium (mmol/L)  Date Value  03/17/2024 133 (L)  04/27/2014 142    Assessment: 83 y.o. male with medical history significant for essential hypertension, urolithiasis recurrent colon and pancreatic cancer in chemotherapy holiday from June of this year, with recent biliary obstruction status post biliary stent placement on 03/04/2024 by Dr. Jinny, who presented to the emergency room with acute onset of worsening jaundice and right upper quadrant abdominal pain. Pharmacy is asked to follow and replace electrolytes  Goal of Therapy:  Electrolytes WNL  Plan:  ---10 mEq IV KCl x 3 ---recheck electrolytes in am  Jared Gutierrez ,PharmD Clinical Pharmacist 03/17/2024 7:12 AM

## 2024-03-17 NOTE — Progress Notes (Signed)
   03/17/24 1545  Spiritual Encounters  Type of Visit Initial  Care provided to: Family (Wife and Daughter in Waiting Room)  Referral source Chaplain assessment  Reason for visit Urgent spiritual support  OnCall Visit No  Spiritual Framework  Presenting Themes Meaning/purpose/sources of inspiration;Goals in life/care;Values and beliefs;Caregiving needs;Impactful experiences and emotions;Other (comment) (Mom and Daughter are caring for the Pt and another family member at the same time.)  Values/beliefs Family is Christian  Family Stress Factors Exhausted;Other (Comment) (Prayed for Wife's knees (will get shots in her knee joints on Wednesday.))  Interventions  Spiritual Care Interventions Made Compassionate presence;Reflective listening;Explored values/beliefs/practices/strengths;Prayer;Encouragement;Other (comment) (for Family)  Intervention Outcomes  Outcomes Connection to spiritual care;Awareness of support;Other (comment) (for Family)

## 2024-03-17 NOTE — Plan of Care (Signed)
  Problem: Clinical Measurements: Goal: Diagnostic test results will improve Outcome: Progressing   Problem: Education: Goal: Knowledge of General Education information will improve Description: Including pain rating scale, medication(s)/side effects and non-pharmacologic comfort measures Outcome: Progressing   Problem: Health Behavior/Discharge Planning: Goal: Ability to manage health-related needs will improve Outcome: Progressing   Problem: Coping: Goal: Level of anxiety will decrease Outcome: Progressing   Problem: Pain Managment: Goal: General experience of comfort will improve and/or be controlled Outcome: Progressing   Problem: Activity: Goal: Risk for activity intolerance will decrease Outcome: Not Progressing   Problem: Elimination: Goal: Will not experience complications related to bowel motility Outcome: Not Progressing

## 2024-03-17 NOTE — Plan of Care (Signed)
  Problem: Fluid Volume: Goal: Hemodynamic stability will improve Outcome: Not Progressing   Problem: Clinical Measurements: Goal: Diagnostic test results will improve Outcome: Progressing Goal: Signs and symptoms of infection will decrease Outcome: Progressing   Problem: Respiratory: Goal: Ability to maintain adequate ventilation will improve Outcome: Progressing   Problem: Education: Goal: Knowledge of General Education information will improve Description: Including pain rating scale, medication(s)/side effects and non-pharmacologic comfort measures Outcome: Progressing   Problem: Health Behavior/Discharge Planning: Goal: Ability to manage health-related needs will improve Outcome: Progressing   Problem: Clinical Measurements: Goal: Ability to maintain clinical measurements within normal limits will improve Outcome: Progressing Goal: Will remain free from infection Outcome: Progressing Goal: Diagnostic test results will improve Outcome: Progressing Goal: Respiratory complications will improve Outcome: Progressing Goal: Cardiovascular complication will be avoided Outcome: Progressing

## 2024-03-17 NOTE — Progress Notes (Addendum)
 PCCM ATTENDING ATTESTATION:  I have evaluated patient with the APP, I personally  reviewed database in its entirety and discussed care plan in detail. In addition, this patient was discussed on multidisciplinary rounds.   Important exam findings: Critically ill Decreased air entry b/l Reg, no Murmurs NT.ND, soft +BS No edema No focal deficits    Major problems addressed by PCCM team:  Septic shock - present on admission - due to GI etiology with acute cholangitis.   Colorectal cancer Biliary obstruction  Pancreatic mass Thrombocytopenia Supratherapeutic INR    Critical care provider statement:   Total critical care time: 33 minutes   Performed by: Parris MD   Critical care time was exclusive of separately billable procedures and treating other patients.   Critical care was necessary to treat or prevent imminent or life-threatening deterioration.   Critical care was time spent personally by me on the following activities: development of treatment plan with patient and/or surrogate as well as nursing, discussions with consultants, evaluation of patient's response to treatment, examination of patient, obtaining history from patient or surrogate, ordering and performing treatments and interventions, ordering and review of laboratory studies, ordering and review of radiographic studies, pulse oximetry and re-evaluation of patient's condition.    Daron Stutz, M.D.  Pulmonary & Critical Care Medicine          NAME:  Jared Gutierrez, MRN:  [id number], DOB:  [birth year], LOS: 3 ADMISSION DATE:  03/14/2024, CONSULTATION DATE:  03/16/2024 REFERRING MD:  Madison Peaches CHIEF COMPLAINT:  Sepsis with shock   History of Present Illness:  Mr. Jared Gutierrez is an 83 y.o. 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 He has not had a bowel movement within the previous 3-4 days and now has developed worsening abdominal distention, nausea and vomiting.  ED Course: Initial vital signs showed HR of 83 beats/minute, BP 105/72mm Hg, the RR 20 breaths/minute, 100 %O2 Sat on RA and Temp of 98.36F (36.7C).   Pertinent Labs/Diagnostics Findings: Na+/K+: 134/3.0, Glucose: 102, BUN/Cr.6/0.78, Calcium : 8.6, AST/ALT: 121/53 ALKPHOS: 278, BILI 18.5, CO2: 18  WBC: 18.2 K/L Hgb/Hct: 8.8/25.3  Lactic acid: 2.5  PT/INR: 44.4/4.5 MRCP showed severe intrahepatic bile duct dilation with increased abrupt caliber anatomy of the body at the confluence of the cystic duct possible acute cholecystitis. Severely dilated and tortuous main pancreatic duct margin substantially markedly distended stomach.    GI was contacted for biliary obstruction and need for emergent ERCP. Transfer to [facility] tertiary care center was recommended but they declined to accept patient due to elevated INR iso sepsis. Patient was subsequently admitted to TRH service and per further discussion it was opted that the safest route was ERCP vs IR with elevated INR for decompression of the biliary system per Dr. Jinny. Patient was taken to OR for emergent ERCP.   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 11/2: Remained hypotensive post-op requiring pressor. PCCM  consulted 11/3: No overnight events. Off of pressors.  Remains on abx, midodrine  and hydrocortisone. GI following.  Interim History / Subjective:  See above listed under Significant Hospital Events.  Micro Data: 11/1: Blood Cultures x 2 >> 11/1: MRSA PCR >> negative   Antimicrobials: Vancomycin  11/2 >> Cefepime  11/2 >> Metronidazole  11/2 >> Ceftriaxone  11/1, stopped 11/2   Objective    Blood pressure (!) 79/42, pulse 69, temperature (!) 97.2 F (36.2 C), resp. rate (!) 8, height 5' 8 (1.727 m), weight 89.2 kg, SpO2 95%.        Intake/Output Summary (Last 24 hours) at 03/17/2024 0735 Last data filed at 03/17/2024 9351 Gross per 24 hour  Intake 4871.57 ml  Output 655 ml  Net 4216.57 ml   Filed Weights   03/14/24 1217 03/15/24 1707  Weight: 92.1 kg 89.2 kg    Examination: General: ill appearing male, lying in bed in NAD HENT: atraumatic, normocephalic, supple, no JVD, scleral icterus Lungs: clear to auscultation bilaterally, no wheezes/crackles/rhonchi Cardiovascular: RRR, S1 S2, no m/r/g Abdomen: mildly soft, non-distended, no rebound/guarding, hypoactive bowel sounds x 4 Extremities: warm, dry, intact, radial pulses 2+, distal pulses 1+, UE edema 1+, LE edema 2+, jaundice Neuro: alert to voice and physical stimuli, oriented to self and place only, follows commands, moves all extremities GU: indwelling foley catheter draining minimal clear yellow urine  Resolved problem list   Assessment and Plan   #Septic Shock with Multiorgan Failure secondary to Acute Ascending Cholangitis - Trend WBC and monitor fever curve - Vasopressors to maintain MAP goal > 65 ~weaned off of levophed + vasopressin - Continue midodrine  and hydrocortisone to assist with BP - A line in place - IV fluid resuscitation as able; will stop D5LR - Cultures pending - ABX: continue Cefepime  + Metronidazole  + Vanc - Narrow ABX per culture and sensitivities - Trend lactic acid; trending down, now at 2.3   #Acute Cholangitis #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 Progressive intrahepatic and extrahepatic biliary duct dilation despite interval placement of bile duct stent. The downstream aspect of the biliary stent projects near the ampulla not clearly within the lumen of the duodenum, and may not provide adequate decompression of the biliary tree. 2. Marked gallbladder distension without evidence of cholelithiasis or acute cholecystitis. 3. Stable complex cystic and solid mass within the pancreatic body, with continued extrinsic compression upon the portal vein, upstream pancreatic duct dilation, and atrophy of the pancreatic body and tail. This has been previously evaluated by MRI. 4. Distended fluid-filled stomach, which may require decompression with enteric catheter. No evidence of bowel obstruction or ileus. 5. Trace ascites. 6.  Aortic Atherosclerosis 10/31 MRCP:  - Constipation protocol - Antiemetics prn: Zofran  - GI following, appreciate input - Continue ABX - Repeat CT Abd/Pelvis today   #Right Lowe Lobe Bleb - Supplemental oxygen to maintain sats >92% - RT interventions with caution give risk for pneumothorax - High risk for pneumothorax requiring chest tube   #Acute Kidney Injury secondary to Septic Shock #Anion Gap Metabolic Acidosis #Hypokalemia - Strict I/O's - Trend BMP and monitor renal function - Creatinine improving; 1.32 this am (Baseline 0.3-0.78) - Potassium 3.3, replace per pharmacy - Avoid nephrotoxins as able - Ensure renal perfusion - Electrolyte replacement protocol; pharmacy to assist with replacement - May need to bladder scan if UO doesn't improve   #Small Bowel Obstruction #Transaminitis #Hyperbilirubinemia #Hypoalbuminemia #Trace Ascites #GERD - Monitor hepatic function - Tbili trending  down, 14.0 this am - AST/ALT: 109/59 - Avoid hepatotoxins as able - GI Prophylaxis: Protonix  40mg  - Feeds: NG tube in  place, continue to suction for SBO - Lipase pending to r/o pancreatitis   #Thrombocytopenia #Anemia #Biliary Obstruction induced Coagulopathy - Trend CBC - Monitor PT/INR, INR 2.5 this am, trending down - Previously received 1 unit FFP and Vitamin K - Transfuse if Hgb < 7; 6.8 this am, 1 unit pRBCs - Monitor for s/sx of bleeding - DVT Prophylaxis: SCDs - DIC workup (Fibrinogen) and smear pending   #Hypoglycemia - ICU hypo/hyperglycemia protocol - CBG Q4h - Glucose goal range: 140-180 - Discontinue D5LR given BG normalized and pt with significant edema    Labs   CBC: Recent Labs  Lab 03/12/24 1304 03/14/24 1313 03/15/24 0744 03/15/24 1534 03/16/24 0221 03/17/24 0410  WBC 5.7 18.2* 22.4* 25.8* 23.4* 13.9*  NEUTROABS 4.6  --   --   --   --  13.1*  HGB 8.6* 8.8* 10.0* 10.3* 8.1* 6.8*  HCT 25.5* 25.3* 28.9* 29.5* 23.5* 19.4*  MCV 95.1 94.8 93.5 93.7 94.0 91.9  PLT 139* 167 165 185 143* 110*    Basic Metabolic Panel: Recent Labs  Lab 03/15/24 0744 03/15/24 2306 03/16/24 0221 03/16/24 1609 03/17/24 0410  NA 138 137 137 133* 133*  K 3.0* 3.6 2.6* 3.3* 3.3*  CL 104 109 109 106 109  CO2 17* 16* 14* 15* 16*  GLUCOSE 71 118* 115* 148* 154*  BUN 11 15 16 20 21   CREATININE 0.73 1.31* 1.31* 1.36* 1.32*  CALCIUM  9.0 8.3* 7.8* 8.0* 8.1*  MG  --   --  1.3*  --  2.1  PHOS  --   --   --   --  3.0   GFR: Estimated Creatinine Clearance: 46 mL/min (A) (by C-G formula based on SCr of 1.32 mg/dL (H)). Recent Labs  Lab 03/15/24 0744 03/15/24 1534 03/16/24 0221 03/16/24 0222 03/16/24 0813 03/16/24 1214 03/16/24 1609 03/16/24 2045 03/17/24 0410  WBC 22.4* 25.8* 23.4*  --   --   --   --   --  13.9*  LATICACIDVEN  --   --   --    < > 2.7* 2.7* 2.6* 2.3*  --    < > = values in this interval not displayed.    Liver Function Tests: Recent Labs  Lab 03/14/24 1313 03/15/24 0744 03/15/24 2306 03/16/24 0407 03/17/24 0410  AST 121* 168* 249* 225* 109*  ALT 53* 68*  89* 86* 59*  ALKPHOS 278* 241* 183* 162* 110  BILITOT 18.5* 18.6* 16.2* 15.4* 14.0*  PROT 5.5* 5.5* 4.9* 4.6* 4.4*  ALBUMIN 2.1* 2.0* 1.9* 1.7* 2.0*   Recent Labs  Lab 03/14/24 1313  LIPASE 23   No results for input(s): AMMONIA in the last 168 hours.  ABG    Component Value Date/Time   O2SAT 85 03/16/2024 0813     Coagulation Profile: Recent Labs  Lab 03/15/24 0744 03/16/24 0654 03/16/24 1609 03/17/24 0410  INR 4.5* 3.6* 3.0* 2.5*    Cardiac Enzymes: No results for input(s): CKTOTAL, CKMB, CKMBINDEX, TROPONINI in the last 168 hours.  HbA1C: Hemoglobin A1C  Date/Time Value Ref Range Status  05/22/2011 06:49 AM 5.3 4.2 - 6.3 % Final    Comment:    The American Diabetes Association recommends that a primary goal of therapy should be <7% and that physicians should reevaluate the treatment regimen in patients with HbA1c values consistently >8%.    Hgb A1c  MFr Bld  Date/Time Value Ref Range Status  04/19/2021 08:32 AM 5.3 4.8 - 5.6 % Final    Comment:    (NOTE)         Prediabetes: 5.7 - 6.4         Diabetes: >6.4         Glycemic control for adults with diabetes: <7.0     CBG: Recent Labs  Lab 03/16/24 0815 03/16/24 1156 03/16/24 1543 03/16/24 2314 03/17/24 0344  GLUCAP 99 83 130* 134* 152*    Review of Systems:   Review of Systems  Constitutional:  Negative for chills and fever.  Respiratory:  Negative for cough, shortness of breath and wheezing.   Cardiovascular:  Negative for chest pain and palpitations.  Gastrointestinal:  Positive for nausea. Negative for vomiting.  Genitourinary:  Negative for dysuria.  Neurological:  Negative for dizziness and headaches.     Past Medical History:  He,  has a past medical history of Arthritis, Cancer (HCC), Cavitary lesion of lung, Chicken pox, Colon cancer (HCC), History of kidney stones, Hypertension, Lipoma of colon, Nephrolithiasis, Nephrolithiasis, Obesity, Shingles, and Tubular adenoma of  colon.   Surgical History:   Past Surgical History:  Procedure Laterality Date   BILIARY STENT PLACEMENT  03/04/2024   Procedure: INSERTION, STENT, BILE DUCT;  Surgeon: Jinny Carmine, MD;  Location: [facility] ENDOSCOPY;  Service: Endoscopy;;   COLON SURGERY     COLONOSCOPY N/A 10/02/2014   Procedure: COLONOSCOPY;  Surgeon: Donnice Vaughn Manes, MD;  Location: [facility] ENDOSCOPY;  Service: Endoscopy;  Laterality: N/A;   COLONOSCOPY WITH PROPOFOL  N/A 02/16/2017   Procedure: COLONOSCOPY WITH PROPOFOL ;  Surgeon: Viktoria Lamar DASEN, MD;  Location[facility] ENDOSCOPY;  Service: Endoscopy;  Laterality: N/A;   COLONOSCOPY WITH PROPOFOL  N/A 10/05/2020   Procedure: COLONOSCOPY WITH PROPOFOL ;  Surgeon: Maryruth Ole DASEN, MD;  Location: [facility] ENDOSCOPY;  Service: Endoscopy;  Laterality: N/A;   ERCP N/A 03/04/2024   Procedure: ERCP, WITH INTERVENTION IF INDICATED;  Surgeon: Jinny Carmine, MD;  Location: [facility] ENDOSCOPY;  Service: Endoscopy;  Laterality: N/A;   ESOPHAGOGASTRODUODENOSCOPY N/A 12/31/2023   Procedure: EGD (ESOPHAGOGASTRODUODENOSCOPY);  Surgeon: Wilhelmenia Aloha Raddle., MD;  Location: [facility];  Service: Gastroenterology;  Laterality: N/A;   EUS N/A 04/17/2019   Procedure: FULL UPPER ENDOSCOPIC ULTRASOUND (EUS) RADIAL;  Surgeon: Queenie Asberry LABOR, MD;  Location: [facility] ENDOSCOPY;  Service: Gastroenterology;  Laterality: N/A;   EUS N/A 12/31/2023   Procedure: ULTRASOUND, UPPER GI TRACT, ENDOSCOPIC;  Surgeon: Wilhelmenia Aloha Raddle., MD;  Location: [facility] ENDOSCOPY;  Service: Gastroenterology;  Laterality: N/A;   FINE NEEDLE ASPIRATION  12/31/2023   Procedure: FINE NEEDLE ASPIRATION;  Surgeon: Wilhelmenia Aloha Raddle., MD;  Location: [facility] ENDOSCOPY;  Service: Gastroenterology;;   KIDNEY STONE SURGERY     PARTIAL COLECTOMY  10/17/2013   PORTACATH PLACEMENT Right 06/13/2019   Procedure: INSERTION PORT-A-CATH;  Surgeon: Tye Millet, DO;  Location: [facility] ORS;  Service: General;  Laterality: Right;     Social History:    reports that he has never smoked. He has never used smokeless tobacco. He reports that he does not currently use alcohol. He reports that he does not use drugs.   Family History:  His family history includes Breast cancer in his mother; COPD in his father; Cancer in his mother.   Allergies No Known Allergies   Home Medications  Prior to Admission medications   Medication Sig Start Date End Date Taking? Authorizing Provider  cholestyramine (QUESTRAN) 4 g packet Take 1 packet (4  g total) by mouth 3 (three) times daily for 14 days. 03/06/24 03/20/24 Yes Agbata, Tochukwu, MD  cyanocobalamin (VITAMIN B12) 500 MCG tablet Take by mouth. 07/26/23 07/25/24 Yes [provider]  feeding supplement (ENSURE PLUS HIGH PROTEIN) LIQD Take 237 mLs by mouth 2 (two) times daily between meals. 03/06/24 04/05/24 Yes Agbata, Tochukwu, MD  furosemide  (LASIX ) 20 MG tablet Take 1 tablet (20 mg total) by mouth daily as needed for edema. 03/12/24  Yes Babara Call, MD  omeprazole  (PRILOSEC) 20 MG capsule Take 20 mg by mouth daily. 12/25/23  Yes [provider]  ondansetron  (ZOFRAN ) 8 MG tablet Take 1 tablet (8 mg total) by mouth 2 (two) times daily as needed for refractory nausea / vomiting. Start on day 3 after chemotherapy. 11/16/20  Yes Babara Call, MD  pantoprazole  (PROTONIX ) 40 MG tablet Take 1 tablet (40 mg total) by mouth every morning. Take in the morning on an empty stomach 12/07/23 03/15/24 Yes Sreenath, Calvin NOVAK, MD  potassium chloride  SA (KLOR-CON  M) 20 MEQ tablet Take 1 tablet (20 mEq total) by mouth daily. 03/12/24  Yes Babara Call, MD  Probiotic Product (PROBIOTIC DAILY PO) Take 1 tablet by mouth daily.   Yes [provider]  tamsulosin  (FLOMAX ) 0.4 MG CAPS capsule Take 0.4 mg by mouth daily after supper. 03/03/24  Yes [provider]  lidocaine -prilocaine  (EMLA ) cream Apply small amount to port and cover with saran wrap 1-2 hours prior to port access Patient not taking: Reported on  03/15/2024 08/27/23   Babara Call, MD  loratadine (CLARITIN) 10 MG tablet Take 10 mg by mouth daily. Patient not taking: Reported on 03/15/2024    [provider]     Critical care time: 43 Minutes      Robet Kim, PA-C [facility] PCCM Team Contact Info: [phone]

## 2024-03-17 NOTE — Telephone Encounter (Signed)
 Pt spouse called to cancel appts on 11/6 as pt is currently in the hospital.  Pt spouse wanted to make sure that Dr.Yu knew pt was in the hospital and they will r/s the appts when he is out.

## 2024-03-18 DIAGNOSIS — K831 Obstruction of bile duct: Secondary | ICD-10-CM | POA: Diagnosis not present

## 2024-03-18 DIAGNOSIS — E43 Unspecified severe protein-calorie malnutrition: Secondary | ICD-10-CM | POA: Diagnosis not present

## 2024-03-18 DIAGNOSIS — Z9889 Other specified postprocedural states: Secondary | ICD-10-CM

## 2024-03-18 LAB — HEPATIC FUNCTION PANEL
ALT: 63 U/L — ABNORMAL HIGH (ref 0–44)
AST: 113 U/L — ABNORMAL HIGH (ref 15–41)
Albumin: 2.3 g/dL — ABNORMAL LOW (ref 3.5–5.0)
Alkaline Phosphatase: 166 U/L — ABNORMAL HIGH (ref 38–126)
Bilirubin, Direct: 9.7 mg/dL — ABNORMAL HIGH (ref 0.0–0.2)
Indirect Bilirubin: 8.6 mg/dL — ABNORMAL HIGH (ref 0.3–0.9)
Total Bilirubin: 18.3 mg/dL (ref 0.0–1.2)
Total Protein: 5.2 g/dL — ABNORMAL LOW (ref 6.5–8.1)

## 2024-03-18 LAB — MAGNESIUM: Magnesium: 2.2 mg/dL (ref 1.7–2.4)

## 2024-03-18 LAB — TYPE AND SCREEN
ABO/RH(D): AB POS
Antibody Screen: NEGATIVE
Unit division: 0

## 2024-03-18 LAB — BPAM RBC
Blood Product Expiration Date: 202511102359
ISSUE DATE / TIME: 202511031006
Unit Type and Rh: 6200

## 2024-03-18 LAB — CBC
HCT: 27.1 % — ABNORMAL LOW (ref 39.0–52.0)
Hemoglobin: 9.7 g/dL — ABNORMAL LOW (ref 13.0–17.0)
MCH: 31.1 pg (ref 26.0–34.0)
MCHC: 35.8 g/dL (ref 30.0–36.0)
MCV: 86.9 fL (ref 80.0–100.0)
Platelets: 138 K/uL — ABNORMAL LOW (ref 150–400)
RBC: 3.12 MIL/uL — ABNORMAL LOW (ref 4.22–5.81)
RDW: 20.2 % — ABNORMAL HIGH (ref 11.5–15.5)
WBC: 20.4 K/uL — ABNORMAL HIGH (ref 4.0–10.5)
nRBC: 0.1 % (ref 0.0–0.2)

## 2024-03-18 LAB — URIC ACID: Uric Acid, Serum: 4.4 mg/dL (ref 3.7–8.6)

## 2024-03-18 LAB — GLUCOSE, CAPILLARY
Glucose-Capillary: 75 mg/dL (ref 70–99)
Glucose-Capillary: 80 mg/dL (ref 70–99)
Glucose-Capillary: 81 mg/dL (ref 70–99)
Glucose-Capillary: 84 mg/dL (ref 70–99)
Glucose-Capillary: 85 mg/dL (ref 70–99)
Glucose-Capillary: 92 mg/dL (ref 70–99)

## 2024-03-18 LAB — BASIC METABOLIC PANEL WITH GFR
Anion gap: 10 (ref 5–15)
BUN: 31 mg/dL — ABNORMAL HIGH (ref 8–23)
CO2: 15 mmol/L — ABNORMAL LOW (ref 22–32)
Calcium: 8.9 mg/dL (ref 8.9–10.3)
Chloride: 110 mmol/L (ref 98–111)
Creatinine, Ser: 1.88 mg/dL — ABNORMAL HIGH (ref 0.61–1.24)
GFR, Estimated: 35 mL/min — ABNORMAL LOW (ref >60)
Glucose, Bld: 91 mg/dL (ref 70–99)
Potassium: 3.5 mmol/L (ref 3.5–5.1)
Sodium: 135 mmol/L (ref 135–145)

## 2024-03-18 LAB — PHOSPHORUS: Phosphorus: 3.6 mg/dL (ref 2.5–4.6)

## 2024-03-18 LAB — PROTIME-INR
INR: 1.8 — ABNORMAL HIGH (ref 0.8–1.2)
Prothrombin Time: 21.6 s — ABNORMAL HIGH (ref 11.4–15.2)

## 2024-03-18 LAB — AMMONIA: Ammonia: 52 umol/L — ABNORMAL HIGH (ref 9–35)

## 2024-03-18 MED ORDER — POTASSIUM CHLORIDE 20 MEQ PO PACK
20.0000 meq | PACK | Freq: Once | ORAL | Status: AC
  Administered 2024-03-18: 20 meq
  Filled 2024-03-18: qty 1

## 2024-03-18 NOTE — Progress Notes (Signed)
 NAME:  Jared Gutierrez, MRN:  [id number], DOB:  [birth year], LOS: 4 ADMISSION DATE:  03/14/2024, CONSULTATION DATE:  03/16/2024 REFERRING MD:  Madison Peaches CHIEF COMPLAINT:  Sepsis with shock   History of Present Illness:  Jared Gutierrez is an 83 y.o. 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 He has not had a bowel movement within the previous 3-4 days and now has developed worsening abdominal distention, nausea and vomiting.  ED Course: Initial vital signs showed HR of 83 beats/minute, BP 105/21mm Hg, the RR 20 breaths/minute, 100 %O2 Sat on RA and Temp of 98.44F (36.7C).   Pertinent Labs/Diagnostics Findings: Na+/K+: 134/3.0, Glucose: 102, BUN/Cr.6/0.78, Calcium : 8.6, AST/ALT: 121/53 ALKPHOS: 278, BILI 18.5, CO2: 18  WBC: 18.2 K/L Hgb/Hct: 8.8/25.3  Lactic acid: 2.5  PT/INR: 44.4/4.5 MRCP showed severe intrahepatic bile duct dilation with increased abrupt caliber anatomy of the body at the confluence of the cystic duct possible acute cholecystitis. Severely dilated and tortuous main pancreatic duct margin substantially markedly distended stomach.    GI was contacted for biliary obstruction and need for emergent ERCP. Transfer to [facility] tertiary care center was recommended but they declined to accept patient due to elevated INR iso sepsis. Patient was subsequently admitted to TRH service and per further discussion it was opted that the safest route was ERCP vs IR with elevated INR for decompression of the biliary system per Dr. Jinny. Patient was taken to OR for emergent ERCP.   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 11/2: Remained hypotensive post-op requiring pressor. PCCM consulted 11/3: No overnight events. Off of pressors. Remains on abx, midodrine  and hydrocortisone. GI following. 03/18/24- patient s/p GI eval- recommendation for perc biliary drain due to increased pancreatic mass effect.  Appears more lethargic today.  Tbili is worsening from 14 to 18.  No UOP today with bladder scan <30cc.  RUQ US  was inconclusive.  Surgery consult and med/onc consult today for additional eval regarding acute cholecystitis, and cancer options.  Interim History / Subjective:  See above listed under Significant Hospital Events.  Micro Data: 11/1: Blood Cultures x 2 >> 11/1: MRSA PCR >> negative   Antimicrobials: Vancomycin  11/2 >> Cefepime  11/2 >> Metronidazole  11/2 >> Ceftriaxone  11/1, stopped 11/2   Objective    Blood pressure 107/67, pulse 69, temperature (!) 97 F (36.1 C), resp. rate 13, height 5' 8 (1.727 m), weight 89.2 kg, SpO2 96%.        Intake/Output Summary (Last 24 hours) at 03/18/2024 0917 Last data filed at 03/18/2024 0600 Gross per 24 hour  Intake 586 ml  Output 495 ml  Net 91 ml   Filed Weights   03/14/24 1217 03/15/24 1707  Weight: 92.1 kg 89.2 kg    Examination: General: ill appearing male, lying in bed in NAD HENT: atraumatic, normocephalic, supple, no JVD, scleral icterus Lungs: clear to auscultation bilaterally,  no wheezes/crackles/rhonchi Cardiovascular: RRR, S1 S2, no m/r/g Abdomen: mildly soft, non-distended, no rebound/guarding, hypoactive bowel sounds x 4 Extremities: warm, dry, intact, radial pulses 2+, distal  pulses 1+, UE edema 1+, LE edema 2+, jaundice Neuro: alert to voice and physical stimuli, oriented to self and place only, follows commands, moves all extremities GU: indwelling foley catheter draining minimal clear yellow urine  Resolved problem list   Assessment and Plan   #Septic Shock with Multiorgan Failure secondary to Acute Ascending Cholangitis - Trend WBC and monitor fever curve - Vasopressors to maintain MAP goal > 65 ~weaned off of levophed + vasopressin - Continue midodrine  and hydrocortisone to assist with BP - A line in place - IV fluid resuscitation as able; will stop D5LR - Cultures pending - ABX: continue Cefepime  + Metronidazole  + Vanc - Narrow ABX per culture and sensitivities - Trend lactic acid; trending down, now at 2.3   #Acute Cholangitis #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 Progressive intrahepatic and extrahepatic biliary duct dilation despite interval placement of bile duct stent. The downstream aspect of the biliary stent projects near the ampulla not clearly within the lumen of the duodenum, and may not provide adequate decompression of the biliary tree. 2. Marked gallbladder distension without evidence of cholelithiasis or acute cholecystitis. 3. Stable complex cystic and solid mass within the pancreatic body, with continued extrinsic compression upon the portal vein, upstream pancreatic duct dilation, and atrophy of the pancreatic body and tail. This has been previously evaluated by MRI. 4. Distended fluid-filled stomach, which may require decompression with enteric catheter. No evidence of bowel obstruction or ileus. 5. Trace ascites. 6.  Aortic Atherosclerosis 10/31 MRCP:  - Constipation protocol - Antiemetics prn: Zofran  - GI following, appreciate input - Continue ABX - Repeat CT Abd/Pelvis today   #Right Lowe Lobe Bleb - Supplemental oxygen to maintain sats  >92% - RT interventions with caution give risk for pneumothorax - High risk for pneumothorax requiring chest tube   #Acute Kidney Injury secondary to Septic Shock #Anion Gap Metabolic Acidosis #Hypokalemia - Strict I/O's - Trend BMP and monitor renal function - Creatinine improving; 1.32 this am (Baseline 0.3-0.78) - Potassium 3.3, replace per pharmacy - Avoid nephrotoxins as able - Ensure renal perfusion - Electrolyte replacement protocol; pharmacy to assist with replacement - May need to bladder scan if UO doesn't improve   #Small Bowel Obstruction #Transaminitis #Hyperbilirubinemia #Hypoalbuminemia #Trace Ascites #GERD - Monitor hepatic function - Tbili trending down, 14.0 this am - AST/ALT: 109/59 - Avoid hepatotoxins as able - GI Prophylaxis: Protonix  40mg  - Feeds: NG tube in place, continue to suction for SBO - Lipase pending to r/o pancreatitis   #Thrombocytopenia #Anemia #Biliary Obstruction induced Coagulopathy - Trend CBC - Monitor PT/INR, INR 2.5 this am, trending down - Previously received 1 unit FFP and Vitamin K - Transfuse if Hgb < 7; 6.8 this am, 1 unit pRBCs - Monitor for s/sx of bleeding - DVT Prophylaxis: SCDs - DIC workup (Fibrinogen) and smear pending   #Hypoglycemia - ICU hypo/hyperglycemia protocol - CBG Q4h - Glucose goal range: 140-180 - Discontinue D5LR given BG normalized and pt with significant edema    Labs   CBC: Recent Labs  Lab 03/12/24 1304 03/14/24 1313 03/15/24 0744 03/15/24 1534 03/16/24 0221 03/17/24 0410 03/17/24 1610 03/18/24 0406  WBC 5.7   < > 22.4* 25.8* 23.4* 13.9*  --  20.4*  NEUTROABS 4.6  --   --   --   --  13.1*  --   --   HGB 8.6*   < > 10.0* 10.3* 8.1* 6.8* 8.8* 9.7*  HCT 25.5*   < > 28.9* 29.5* 23.5* 19.4* 25.1* 27.1*  MCV 95.1   < > 93.5 93.7 94.0 91.9  --  86.9  PLT 139*   < > 165 185 143* 110*  --  138*   < > = values in this interval not displayed.    Basic Metabolic Panel: Recent Labs   Lab 03/15/24 2306 03/16/24 0221 03/16/24 1609 03/17/24 0410 03/17/24 1723 03/17/24 2253 03/18/24 0406  NA 137 137 133* 133*  --   --  135  K 3.6 2.6* 3.3* 3.3* 3.5 3.7 3.5  CL 109 109 106 109  --   --  110  CO2 16* 14* 15* 16*  --   --  15*  GLUCOSE 118* 115* 148* 154*  --   --  91  BUN 15 16 20 21   --   --  31*  CREATININE 1.31* 1.31* 1.36* 1.32*  --   --  1.88*  CALCIUM  8.3* 7.8* 8.0* 8.1*  --   --  8.9  MG  --  1.3*  --  2.1  --  2.2 2.2  PHOS  --   --   --  3.0  --   --  3.6   GFR: Estimated Creatinine Clearance: 32.3 mL/min (A) (by C-G formula based on SCr of 1.88 mg/dL (H)). Recent Labs  Lab 03/15/24 1534 03/16/24 0221 03/16/24 0222 03/16/24 0813 03/16/24 1214 03/16/24 1609 03/16/24 2045 03/17/24 0410 03/18/24 0406  WBC 25.8* 23.4*  --   --   --   --   --  13.9* 20.4*  LATICACIDVEN  --   --    < > 2.7* 2.7* 2.6* 2.3*  --   --    < > = values in this interval not displayed.    Liver Function Tests: Recent Labs  Lab 03/14/24 1313 03/15/24 0744 03/15/24 2306 03/16/24 0407 03/17/24 0410  AST 121* 168* 249* 225* 109*  ALT 53* 68* 89* 86* 59*  ALKPHOS 278* 241* 183* 162* 110  BILITOT 18.5* 18.6* 16.2* 15.4* 14.0*  PROT 5.5* 5.5* 4.9* 4.6* 4.4*  ALBUMIN 2.1* 2.0* 1.9* 1.7* 2.0*   Recent Labs  Lab 03/14/24 1313 03/17/24 1401  LIPASE 23 19   No results for input(s): AMMONIA in the last 168 hours.  ABG    Component Value Date/Time   O2SAT 85 03/16/2024 0813     Coagulation Profile: Recent Labs  Lab 03/15/24 0744 03/16/24 0654 03/16/24 1609 03/17/24 0410 03/18/24 0406  INR 4.5* 3.6* 3.0* 2.5* 1.8*    Cardiac Enzymes: No results for input(s): CKTOTAL, CKMB, CKMBINDEX, TROPONINI in the last 168 hours.  HbA1C: Hemoglobin A1C  Date/Time Value Ref Range Status  05/22/2011 06:49 AM 5.3 4.2 - 6.3 % Final    Comment:    The American Diabetes Association recommends that a primary goal of therapy should be <7% and that physicians  should reevaluate the treatment regimen in patients with HbA1c values consistently >8%.    Hgb A1c MFr Bld  Date/Time Value Ref Range Status  04/19/2021 08:32 AM 5.3 4.8 - 5.6 % Final    Comment:    (NOTE)         Prediabetes: 5.7 - 6.4         Diabetes: >6.4         Glycemic control for adults with diabetes: <7.0  CBG: Recent Labs  Lab 03/17/24 1734 03/17/24 1936 03/17/24 2341 03/18/24 0415 03/18/24 0850  GLUCAP 71 92 93 75 92    Review of Systems:   Review of Systems  Constitutional:  Negative for chills and fever.  Respiratory:  Negative for cough, shortness of breath and wheezing.   Cardiovascular:  Negative for chest pain and palpitations.  Gastrointestinal:  Positive for nausea. Negative for vomiting.  Genitourinary:  Negative for dysuria.  Neurological:  Negative for dizziness and headaches.     Past Medical History:  He,  has a past medical history of Arthritis, Cancer (HCC), Cavitary lesion of lung, Chicken pox, Colon cancer (HCC), History of kidney stones, Hypertension, Lipoma of colon, Nephrolithiasis, Nephrolithiasis, Obesity, Shingles, and Tubular adenoma of colon.   Surgical History:   Past Surgical History:  Procedure Laterality Date   BILIARY STENT PLACEMENT  03/04/2024   Procedure: INSERTION, STENT, BILE DUCT;  Surgeon: Jinny Carmine, MD;  Location: [facility] ENDOSCOPY;  Service: Endoscopy;;   BILIARY STENT PLACEMENT  03/15/2024   Procedure: INSERTION, STENT, BILE DUCT;  Surgeon: Jinny Carmine, MD;  Location: [facility] ENDOSCOPY;  Service: Endoscopy;;   COLON SURGERY     COLONOSCOPY N/A 10/02/2014   Procedure: COLONOSCOPY;  Surgeon: Donnice Vaughn Manes, MD;  Location: [facility] ENDOSCOPY;  Service: Endoscopy;  Laterality: N/A;   COLONOSCOPY WITH PROPOFOL  N/A 02/16/2017   Procedure: COLONOSCOPY WITH PROPOFOL ;  Surgeon: Viktoria Lamar DASEN, MD;  Location: [facility] ENDOSCOPY;  Service: Endoscopy;  Laterality: N/A;   COLONOSCOPY WITH PROPOFOL  N/A 10/05/2020   Procedure:  COLONOSCOPY WITH PROPOFOL ;  Surgeon: Maryruth Ole DASEN, MD;  Location: [facility] ENDOSCOPY;  Service: Endoscopy;  Laterality: N/A;   ERCP N/A 03/04/2024   Procedure: ERCP, WITH INTERVENTION IF INDICATED;  Surgeon: Jinny Carmine, MD;  Location: [facility] ENDOSCOPY;  Service: Endoscopy;  Laterality: N/A;   ERCP N/A 03/15/2024   Procedure: ERCP, WITH INTERVENTION IF INDICATED;  Surgeon: Jinny Carmine, MD;  Location: [facility] ENDOSCOPY;  Service: Endoscopy;  Laterality: N/A;   ESOPHAGOGASTRODUODENOSCOPY N/A 12/31/2023   Procedure: EGD (ESOPHAGOGASTRODUODENOSCOPY);  Surgeon: Wilhelmenia Aloha Raddle., MD;  Location: [facility];  Service: Gastroenterology;  Laterality: N/A;   EUS N/A 04/17/2019   Procedure: FULL UPPER ENDOSCOPIC ULTRASOUND (EUS) RADIAL;  Surgeon: Queenie Asberry LABOR, MD;  Location: [facility] ENDOSCOPY;  Service: Gastroenterology;  Laterality: N/A;   EUS N/A 12/31/2023   Procedure: ULTRASOUND, UPPER GI TRACT, ENDOSCOPIC;  Surgeon: Wilhelmenia Aloha Raddle., MD;  Location: [facility] ENDOSCOPY;  Service: Gastroenterology;  Laterality: N/A;   FINE NEEDLE ASPIRATION  12/31/2023   Procedure: FINE NEEDLE ASPIRATION;  Surgeon: Wilhelmenia Aloha Raddle., MD;  Location: [facility] ENDOSCOPY;  Service: Gastroenterology;;   KIDNEY STONE SURGERY     PARTIAL COLECTOMY  10/17/2013   PORTACATH PLACEMENT Right 06/13/2019   Procedure: INSERTION PORT-A-CATH;  Surgeon: Tye Millet, DO;  Location: [facility] ORS;  Service: General;  Laterality: Right;   STENT REMOVAL  03/15/2024   Procedure: STENT REMOVAL;  Surgeon: Jinny Carmine, MD;  Location: [facility] ENDOSCOPY;  Service: Endoscopy;;     Social History:   reports that he has never smoked. He has never used smokeless tobacco. He reports that he does not currently use alcohol. He reports that he does not use drugs.   Family History:  His family history includes Breast cancer in his mother; COPD in his father; Cancer in his mother.   Allergies No Known Allergies   Home Medications  Prior to  Admission medications   Medication Sig Start Date End Date Taking? Authorizing Provider  cholestyramine (QUESTRAN) 4 g packet Take 1 packet (4 g total) by mouth 3 (three) times daily for 14 days. 03/06/24 03/20/24 Yes Agbata, Tochukwu, MD  cyanocobalamin (VITAMIN B12) 500 MCG tablet Take by mouth. 07/26/23 07/25/24 Yes [provider]  feeding supplement (ENSURE PLUS HIGH PROTEIN) LIQD Take 237 mLs by mouth 2 (two) times daily between meals. 03/06/24 04/05/24 Yes Agbata, Tochukwu, MD  furosemide  (LASIX ) 20 MG tablet Take 1 tablet (20 mg total) by mouth daily as needed for edema. 03/12/24  Yes Babara Call, MD  omeprazole  (PRILOSEC) 20 MG capsule Take 20 mg by mouth daily. 12/25/23  Yes [provider]  ondansetron  (ZOFRAN ) 8 MG tablet Take 1 tablet (8 mg total) by mouth 2 (two) times daily as needed for refractory nausea / vomiting. Start on day 3 after chemotherapy. 11/16/20  Yes Babara Call, MD  pantoprazole  (PROTONIX ) 40 MG tablet Take 1 tablet (40 mg total) by mouth every morning. Take in the morning on an empty stomach 12/07/23 03/15/24 Yes Sreenath, Calvin NOVAK, MD  potassium chloride  SA (KLOR-CON  M) 20 MEQ tablet Take 1 tablet (20 mEq total) by mouth daily. 03/12/24  Yes Babara Call, MD  Probiotic Product (PROBIOTIC DAILY PO) Take 1 tablet by mouth daily.   Yes [provider]  tamsulosin  (FLOMAX ) 0.4 MG CAPS capsule Take 0.4 mg by mouth daily after supper. 03/03/24  Yes [provider]  lidocaine -prilocaine  (EMLA ) cream Apply small amount to port and cover with saran wrap 1-2 hours prior to port access Patient not taking: Reported on 03/15/2024 08/27/23   Babara Call, MD  loratadine (CLARITIN) 10 MG tablet Take 10 mg by mouth daily. Patient not taking: Reported on 03/15/2024    [provider]     Critical care provider statement:   Total critical care time: 33 minutes   Performed by: Parris MD   Critical care time was exclusive of separately billable procedures  and treating other patients.   Critical care was necessary to treat or prevent imminent or life-threatening deterioration.   Critical care was time spent personally by me on the following activities: development of treatment plan with patient and/or surrogate as well as nursing, discussions with consultants, evaluation of patient's response to treatment, examination of patient, obtaining history from patient or surrogate, ordering and performing treatments and interventions, ordering and review of laboratory studies, ordering and review of radiographic studies, pulse oximetry and re-evaluation of patient's condition.    Rettie Laird, M.D.  Pulmonary & Critical Care Medicine

## 2024-03-18 NOTE — Consult Note (Signed)
 Hematology/Oncology Consult note [phone] [phone]      Patient Care Team: Sadie Manna, MD as PCP - General (Internal Medicine) Darliss Rogue, MD as PCP - Cardiology (Cardiology) Sadie Manna, MD (Internal Medicine) Jeri Donnice Burkes, MD as Referring Physician (Gastroenterology) Maurie Rayfield BIRCH, RN as Oncology Nurse Navigator Babara Call, MD as Consulting Physician (Oncology) Lenn Aran, MD as Consulting Physician (Radiation Oncology)   Name of the patient: Jared Gutierrez  [id number]  [birth year]   REASON FOR COSULTATION:  Colorectal cancer, pancreatic mass. History of presenting illness-  83 y.o. male with PMH listed at below who is currently admitted to ICU due to due to obstructive jaundice, sepsis.  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 He was recently hospitalized due to obstruction and jaundice with a bilirubin of 28.8 status post ERCP with stent placement.  At discharge on 03/06/2024, bilirubin was downward trending to 14.9. Outpatient follow-up blood work on 03/12/2024 showed bilirubin of 13.9.  When he presented to emergency room on 03/14/2024, bilirubin has increased to 18.5. MRI MRCP with and without contrast showed similar severe intrahepatic bile duct dilatation with increased clot caliber narrowing of the hepatic duct at the confluence with the cystic duct. Persistent distended gallbladder now demonstrates circumferential mural thickening, which may be reactive or reflect acute cholecystitis.  Unchanged dilated main pancreatic duct, ill-defined soft tissue mass in the pancreatic head 4.1 x 2.5 cm.  03/15/2024 patient had a repeat ERCP with stent exchange. Patient developed hypotension requiring pressor-septic shock secondary to acute cholangitis and recurrent malignant biliary obstruction.  Patient is on broad-spectrum IV antibiotics.  Bilirubin  after repeat ERCP decreased to 14 on 03/17/2024 and increased again on 03/18/2024-18.3. IR was consulted for percutaneous biliary drainage tube placement. Surgery was consulted for evaluation of feasibility of surgical management of cholecystitis. Patient is lethargic.  Daughter and wife are at the bedside.    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    Priority: High   Edema 03/12/2024    Priority: Medium    Obstructive jaundice (HCC) 03/04/2024    Priority: Medium    Lung nodules 01/16/2023    Priority: Medium    Weight loss 12/19/2022    Priority: Medium    Normocytic anemia 12/02/2020    Priority: Medium    Hypokalemia 07/23/2019    Priority: Medium    Encounter for antineoplastic chemotherapy 06/25/2019    Priority: Medium    Gastritis without bleeding 12/31/2023    Priority: Low   Diarrhea 12/04/2023    Priority: Low   GERD (gastroesophageal reflux disease) 09/18/2023    Priority: Low   UTI (urinary tract infection) 08/28/2023    Priority: Low   Hyponatremia 08/14/2023    Priority: Low   Bilateral leg edema 01/30/2023    Priority: Low   Subconjunctival hemorrhage 09/12/2022    Priority: Low   Epistaxis 07/04/2022    Priority: Low   Elevated PSA 01/03/2022    Priority: Low   Constipation 10/26/2021    Priority: Low   Chemotherapy induced diarrhea 09/28/2021    Priority: Low   Hypotension due to hypovolemia 05/18/2021    Priority: Low   Port-A-Cath in place 03/08/2021    Priority: Low   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    Priority: Low   Goals of care, counseling/discussion 06/25/2019    Priority: Low   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/2019    Priority: Low   Protein-calorie malnutrition, severe 03/17/2024   GERD without esophagitis 03/15/2024   Pancreatic cancer (HCC) 03/15/2024   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   Elevated  LFTs 03/04/2024   Pancreatic mass 12/31/2023   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   Abdominal pain 12/04/2023   HTN (hypertension) 12/04/2023   Obesity (BMI 30-39.9) 12/04/2023   BPH (benign prostatic hyperplasia) 03/28/2022   Need for prophylactic vaccination and inoculation against influenza 03/28/2022   Aortic atherosclerosis 08/02/2020   Hepatic steatosis 08/02/2020   Chemotherapy-induced neutropenia 12/20/2019   Cellulitis of left lower extremity 11/03/2019   Pseudogout of foot, left 11/03/2019   Hypomagnesemia 10/29/2019   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   Sepsis (HCC) 08/16/2019   Cellulitis of right lower extremity 08/16/2019   Maintenance chemotherapy 08/16/2019   Lesion of right lung 07/01/2019   Nephrolithiasis 07/01/2019   Osteoarthritis 07/01/2019   Morbid obesity with BMI of 40.0-44.9, adult (HCC) 01/27/2019   Elevated CEA 12/24/2018   Hypertension 05/10/2016   Primary osteoarthritis of left knee 02/05/2014   History of colon cancer, stage I 10/17/2013     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   Chicken pox    Colon cancer (HCC)    History of kidney stones    Hypertension    Lipoma of colon    Nephrolithiasis    Nephrolithiasis    Obesity    Shingles    Tubular adenoma of colon    multiple fragments     Past Surgical History:  Procedure Laterality Date   BILIARY STENT PLACEMENT  03/04/2024   Procedure: INSERTION, STENT, BILE DUCT;  Surgeon: Jinny Carmine, MD;  Location: [facility] ENDOSCOPY;  Service: Endoscopy;;   BILIARY STENT PLACEMENT  03/15/2024   Procedure: INSERTION, STENT, BILE DUCT;  Surgeon: Jinny Carmine, MD;  Location: [facility] ENDOSCOPY;  Service: Endoscopy;;   COLON SURGERY     COLONOSCOPY N/A 10/02/2014   Procedure: COLONOSCOPY;  Surgeon: Donnice Vaughn Manes, MD;  Location: [facility] ENDOSCOPY;  Service: Endoscopy;  Laterality:  N/A;   COLONOSCOPY WITH PROPOFOL  N/A 02/16/2017   Procedure: COLONOSCOPY WITH PROPOFOL ;  Surgeon: Viktoria Lamar DASEN, MD;  Location: [facility] ENDOSCOPY;  Service: Endoscopy;  Laterality: N/A;   COLONOSCOPY WITH PROPOFOL  N/A 10/05/2020   Procedure: COLONOSCOPY WITH PROPOFOL ;  Surgeon: Maryruth Ole DASEN, MD;  Location: [facility] ENDOSCOPY;  Service: Endoscopy;  Laterality: N/A;   ERCP N/A 03/04/2024   Procedure: ERCP, WITH INTERVENTION IF INDICATED;  Surgeon: Jinny Carmine, MD;  Location: [facility] ENDOSCOPY;  Service: Endoscopy;  Laterality: N/A;   ERCP N/A 03/15/2024   Procedure: ERCP, WITH INTERVENTION IF INDICATED;  Surgeon: Jinny Carmine, MD;  Location: [facility] ENDOSCOPY;  Service: Endoscopy;  Laterality: N/A;   ESOPHAGOGASTRODUODENOSCOPY N/A 12/31/2023   Procedure: EGD (ESOPHAGOGASTRODUODENOSCOPY);  Surgeon: Wilhelmenia Aloha Raddle., MD;  Location: [facility];  Service: Gastroenterology;  Laterality: N/A;   EUS N/A 04/17/2019   Procedure: FULL UPPER ENDOSCOPIC ULTRASOUND (EUS) RADIAL;  Surgeon: Queenie Asberry LABOR, MD;  Location: [facility] ENDOSCOPY;  Service: Gastroenterology;  Laterality: N/A;   EUS N/A 12/31/2023   Procedure: ULTRASOUND, UPPER GI TRACT, ENDOSCOPIC;  Surgeon: Wilhelmenia Aloha Raddle., MD;  Location: [facility] ENDOSCOPY;  Service: Gastroenterology;  Laterality: N/A;   FINE NEEDLE ASPIRATION  12/31/2023   Procedure: FINE NEEDLE ASPIRATION;  Surgeon: Wilhelmenia,  Aloha Raddle., MD;  Location: [facility];  Service: Gastroenterology;;   KIDNEY STONE SURGERY     PARTIAL COLECTOMY  10/17/2013   PORTACATH PLACEMENT Right 06/13/2019   Procedure: INSERTION PORT-A-CATH;  Surgeon: Tye Millet, DO;  Location: [facility] ORS;  Service: General;  Laterality: Right;   STENT REMOVAL  03/15/2024   Procedure: STENT REMOVAL;  Surgeon: Jinny Carmine, MD;  Location: [facility] ENDOSCOPY;  Service: Endoscopy;;    Social History   Socioeconomic History   Marital status: Married    Spouse name: Not on file   Number of children: Not on  file   Years of education: Not on file   Highest education level: Not on file  Occupational History   Not on file  Tobacco Use   Smoking status: Never   Smokeless tobacco: Never  Vaping Use   Vaping status: Never Used  Substance and Sexual Activity   Alcohol use: Not Currently    Comment: socially    Drug use: No   Sexual activity: Not on file  Other Topics Concern   Not on file  Social History Narrative   Not on file   Social Drivers of Health   Financial Resource Strain: Not on file  Food Insecurity: Patient Unable To Answer (03/16/2024)   Hunger Vital Sign    Worried About Running Out of Food in the Last Year: Patient unable to answer    Ran Out of Food in the Last Year: Patient unable to answer  Transportation Needs: Patient Unable To Answer (03/16/2024)   PRAPARE - Transportation    Lack of Transportation (Medical): Patient unable to answer    Lack of Transportation (Non-Medical): Patient unable to answer  Physical Activity: Not on file  Stress: Not on file  Social Connections: Patient Unable To Answer (03/16/2024)   Social Connection and Isolation Panel    Frequency of Communication with Friends and Family: Patient unable to answer    Frequency of Social Gatherings with Friends and Family: Patient unable to answer    Attends Religious Services: Patient unable to answer    Active Member of Clubs or Organizations: Patient unable to answer    Attends Banker Meetings: Patient unable to answer    Marital Status: Patient unable to answer  Intimate Partner Violence: Patient Unable To Answer (03/16/2024)   Humiliation, Afraid, Rape, and Kick questionnaire    Fear of Current or Ex-Partner: Patient unable to answer    Emotionally Abused: Patient unable to answer    Physically Abused: Patient unable to answer    Sexually Abused: Patient unable to answer     Family History  Problem Relation Age of Onset   Cancer Mother    Breast cancer Mother    COPD Father       Current Facility-Administered Medications:    acetaminophen  (TYLENOL ) tablet 650 mg, 650 mg, Per Tube, Q6H PRN **OR** acetaminophen  (TYLENOL ) suppository 650 mg, 650 mg, Rectal, Q6H PRN, Hunt, Madison H, RPH   ceFEPIme  (MAXIPIME ) 2 g in sodium chloride  0.9 % 100 mL IVPB, 2 g, Intravenous, Q12H, Dail Rankin RAMAN, RPH, Paused at 03/18/24 0546   Chlorhexidine  Gluconate Cloth 2 % PADS 6 each, 6 each, Topical, Daily, Patel, Sona, MD, 6 each at 03/18/24 0857   feeding supplement (ENSURE PLUS HIGH PROTEIN) liquid 237 mL, 237 mL, Oral, BID BM, Mansy, Jan A, MD, 237 mL at 03/15/24 9056   furosemide  (LASIX ) injection 40 mg, 40 mg, Intravenous, Daily, Aleskerov, Fuad, MD, 40 mg at  03/18/24 0857   hydrocortisone sodium succinate (SOLU-CORTEF) 100 MG injection 50 mg, 50 mg, Intravenous, Q8H, Bousman, Karlie, PA-C, 50 mg at 03/18/24 0411   magnesium  hydroxide (MILK OF MAGNESIA) suspension 30 mL, 30 mL, Oral, Daily PRN, Mansy, Jan A, MD   metroNIDAZOLE  (FLAGYL ) IVPB 500 mg, 500 mg, Intravenous, Q12H, Mansy, Jan A, MD, Last Rate: 100 mL/hr at 03/18/24 0608, 500 mg at 03/18/24 0608   midodrine  (PROAMATINE ) tablet 10 mg, 10 mg, Per Tube, TID WC, Clair Marolyn NOVAK, RPH, 10 mg at 03/18/24 0857   mineral oil-hydrophilic petrolatum (AQUAPHOR) ointment, , Topical, PRN, Ouma, Elizabeth Achieng, NP, Given at 03/17/24 0644   morphine  (PF) 2 MG/ML injection 0.5 mg, 0.5 mg, Intravenous, Q6H PRN, Patel, Sona, MD   ondansetron  (ZOFRAN ) tablet 4 mg, 4 mg, Per Tube, Q6H PRN **OR** ondansetron  (ZOFRAN ) injection 4 mg, 4 mg, Intravenous, Q6H PRN, Hunt, Madison H, RPH   pantoprazole  (PROTONIX ) injection 40 mg, 40 mg, Intravenous, Daily, Clair Marolyn NOVAK, RPH, 40 mg at 03/18/24 0857   sodium chloride  flush (NS) 0.9 % injection 10-40 mL, 10-40 mL, Intracatheter, Q12H, Tobie Calix, MD, 10 mL at 03/18/24 0858   sodium chloride  flush (NS) 0.9 % injection 10-40 mL, 10-40 mL, Intracatheter, PRN, Patel, Sona, MD   traZODone (DESYREL)  tablet 25 mg, 25 mg, Per Tube, QHS PRN, Hunt, Madison H, RPH  Facility-Administered Medications Ordered in Other Encounters:    0.9 %  sodium chloride  infusion, , Intravenous, Once, Corcoran, Melissa C, MD   0.9 %  sodium chloride  infusion, , Intravenous, Continuous, Corcoran, Melissa C, MD, Last Rate: 10 mL/hr at 12/31/23 1008, Continued from Pre-op at 12/31/23 1008   heparin  lock flush 100 unit/mL, 500 Units, Intravenous, Once, Corcoran, Melissa C, MD   sodium chloride  flush (NS) 0.9 % injection 10 mL, 10 mL, Intracatheter, PRN, Babara Call, MD, 10 mL at 02/01/23 1341  Review of Systems  Unable to perform ROS: Mental status change    PHYSICAL EXAM Vitals:   03/18/24 0800 03/18/24 0900 03/18/24 1000 03/18/24 1100  BP: 127/81 107/67 107/67 116/69  Pulse: 86 69 74 76  Resp: 20 13 14 16   Temp: (!) 96.6 F (35.9 C) (!) 97 F (36.1 C) (!) 97.3 F (36.3 C) (!) 97.5 F (36.4 C)  TempSrc: Rectal     SpO2: 97% 96% 96% 96%  Weight:      Height:       Physical Exam Constitutional:      General: He is not in acute distress.    Appearance: He is not diaphoretic.  HENT:     Head: Normocephalic and atraumatic.     Mouth/Throat:     Pharynx: No oropharyngeal exudate.  Eyes:     General: Scleral icterus present.  Cardiovascular:     Rate and Rhythm: Normal rate and regular rhythm.  Pulmonary:     Effort: Pulmonary effort is normal. No respiratory distress.     Breath sounds: Normal breath sounds.  Abdominal:     General: There is no distension.     Palpations: Abdomen is soft.  Musculoskeletal:        General: Normal range of motion.  Skin:    General: Skin is warm and dry.     Findings: No erythema.  Neurological:     Mental Status: He is alert and oriented to person, place, and time. Mental status is at baseline.     Motor: No abnormal muscle tone.     Comments: Lethargic  Psychiatric:        Mood and Affect: Affect normal.       LABORATORY STUDIES    Latest Ref Rng &  Units 03/18/2024    4:06 AM 03/17/2024    4:10 PM 03/17/2024    4:10 AM  CBC  WBC 4.0 - 10.5 K/uL 20.4   13.9   Hemoglobin 13.0 - 17.0 g/dL 9.7  8.8  6.8   Hematocrit 39.0 - 52.0 % 27.1  25.1  19.4   Platelets 150 - 400 K/uL 138   110       Latest Ref Rng & Units 03/18/2024    4:06 AM 03/17/2024   10:53 PM 03/17/2024    5:23 PM  CMP  Glucose 70 - 99 mg/dL 91     BUN 8 - 23 mg/dL 31     Creatinine 9.38 - 1.24 mg/dL 8.11     Sodium 864 - 854 mmol/L 135     Potassium 3.5 - 5.1 mmol/L 3.5  3.7  3.5   Chloride 98 - 111 mmol/L 110     CO2 22 - 32 mmol/L 15     Calcium  8.9 - 10.3 mg/dL 8.9     Total Protein 6.5 - 8.1 g/dL 5.2     Total Bilirubin 0.0 - 1.2 mg/dL 81.6     Alkaline Phos 38 - 126 U/L 166     AST 15 - 41 U/L 113     ALT 0 - 44 U/L 63        RADIOGRAPHIC STUDIES: I have personally reviewed the radiological images as listed and agreed with the findings in the report. US  Abdomen Limited RUQ (LIVER/GB) Result Date: 03/17/2024 CLINICAL DATA:  6194 Acute cholecystitis 575.0.ICD-9-CM EXAM: ULTRASOUND ABDOMEN LIMITED RIGHT UPPER QUADRANT COMPARISON:  03/17/2024, 03/14/2024, 02/27/2024 FINDINGS: Gallbladder: Similar diffuse dilation of the gallbladder without shadowing gallstones. Circumferential wall thickening and gallbladder wall edema noted. No sonographic Murphy's sign noted by sonographer. Common bile duct: Obscured by overlying bowel gas. Liver: Normal echogenicity. No focal lesion identified. Extensive pneumobilia again noted. Portal vein is patent on color Doppler imaging with normal direction of blood flow towards the liver. Other: Small volume perihepatic ascites again noted IMPRESSION: 1. Similar fluid-filled dilation of the gallbladder. Circumferential wall thickening and gallbladder wall edema, which in the absence of a sonographic Murphy's sign and gallstones, may be related to upper abdominal inflammation, acute hepatitis, or volume overload from either CHF or renal failure.  2. Extensive pneumobilia again noted. 3. Small volume perihepatic ascites.  Right pleural effusion. Electronically Signed   By: Rogelia Myers M.D.   On: 03/17/2024 18:36   CT ABDOMEN PELVIS WO CONTRAST Result Date: 03/17/2024 EXAM: CT ABDOMEN AND PELVIS WITHOUT CONTRAST 03/17/2024 04:53:03 PM TECHNIQUE: CT of the abdomen and pelvis was performed without the administration of intravenous contrast. Multiplanar reformatted images are provided for review. Automated exposure control, iterative reconstruction, and/or weight-based adjustment of the mA/kV was utilized to reduce the radiation dose to as low as reasonably achievable. Multifactorial degradation, including lack of IV contrast and patient arm position, not raised above the head. COMPARISON: MRI of 03/14/2024. CT of 03/14/2024. CLINICAL HISTORY: Bowel obstruction suspected. History of pancreatic and colon carcinoma. * Tracking Code: BO * FINDINGS: LOWER CHEST: Bilateral lower lobe bullous emphysema with new medial bilateral lower lobe consolidation. New small bilateral pleural effusions. Right ventricular central line. LIVER: Limited evaluation of the liver. New pneumobilia suggesting stent patency in the setting of common duct stent. GALLBLADDER  AND BILE DUCTS: Persistent moderate gallbladder distention with suggestion of pericholecystic edema including image 37/10. The common duct stent terminates more distally, in the inferior genu of the duodenum. SPLEEN: No acute abnormality. PANCREAS: The pancreatic head/neck lesion is poorly evaluated but grossly similar at 2.8 x 2.2 cm on image 41/3. ADRENAL GLANDS: No acute abnormality. KIDNEYS, URETERS AND BLADDER: Contrast remains within the kidneys from prior contrast-enhanced exams. Left renal cortical scarring in the lower pole. Right renal cyst of up to 2.5 cm. No follow up indicated. No hydronephrosis. Foley catheter decompresses the urinary bladder. GI AND BOWEL: Placement of a nasogastric tube which  terminates in the gastric body. resolved gastric distention. Normal small bowel caliber. Appendix not visualized. There is no bowel obstruction. PERITONEUM AND RETROPERITONEUM: Increase in small volume abdominal pelvic ascites. No free intraperitoneal air. VASCULATURE: Aorta is normal in caliber. Aortic atherosclerosis. LYMPH NODES: Prominent but not pathologically sized abdominal retroperitoneal nodes are likely reactive. REPRODUCTIVE ORGANS: No acute abnormality. BONES AND SOFT TISSUES: Degenerative changes of both hips. Increased diffuse anasarca. Small bore right sided femoral venous catheter. No acute osseous abnormality. No focal soft tissue abnormality. IMPRESSION: 1. Multifactorial degradation, including lack of IV contrast and patient arm position. 2. Development of fluid overload, with new bilateral pleural effusions, increased anasarca, and ascites. 3. No evidence of bowel obstruction. 4. Repositioning or replacement of common bile duct stent with new pneumobilia indicative of stent patency. Persistent gallbladder distention with suggestion of pericholecystic edema. Cannot exclude acute cholecystitis. Consider right upper quadrant ultrasound. 5. New bibasilar airspace disease, suspicious for infection or aspiration. 6. Placement of a nasogastric tube with resolution of gastric distention. Electronically signed by: Rockey Kilts MD 03/17/2024 05:38 PM EST RP Workstation: [id number]   DG Abd 1 View Result Date: 03/16/2024 CLINICAL DATA:  Nasogastric tube placement. EXAM: DG ABDOMEN 1V COMPARISON:  03/15/2024 FINDINGS: Nasogastric tube shows a loop within the gastric fundus, with distal tip overlying the gastric body. Gaseous distension of small bowel and transverse colon noted, likely due to ileus. IMPRESSION: Nasogastric tube tip overlies the gastric body. Probable ileus. Electronically Signed   By: Norleen DELENA Kil M.D.   On: 03/16/2024 05:43   DG C-Arm 1-60 Min-No Report Result Date:  03/15/2024 Fluoroscopy was utilized by the requesting physician.  No radiographic interpretation.   DG Abd Portable 1V Result Date: 03/15/2024 EXAM: 1 VIEW XRAY OF THE ABDOMEN 03/15/2024 05:59:00 PM COMPARISON: 04/11/2023 CT 03/14/2024 CLINICAL HISTORY: [id number] Encounter for imaging study to confirm nasogastric (NG) tube placement [id number] Encounter for imaging study to confirm nasogastric (NG) tube placement FINDINGS: LINES, TUBES AND DEVICES: NG tube tip is in the mid stomach. Biliary stent noted in place. BOWEL: Gaseous distention of bowel in the right abdomen likely reflects colon. SOFT TISSUES: No opaque urinary calculi. BONES: No acute osseous abnormality. IMPRESSION: 1. NG tube tip in the mid stomach. 2. Gaseous distention of bowel in the right abdomen, likely reflecting colon. Electronically signed by: Franky Crease MD 03/15/2024 06:12 PM EDT RP Workstation: [id number]   MR ABDOMEN MRCP W WO CONTAST Result Date: 03/14/2024 CLINICAL DATA:  Known history of colon and pancreatic cancer with worsening abdominal pain, constipation EXAM: MRI ABDOMEN WITHOUT AND WITH CONTRAST (INCLUDING MRCP) TECHNIQUE: Multiplanar multisequence MR imaging of the abdomen was performed both before and after the administration of intravenous contrast. Heavily T2-weighted images of the biliary and pancreatic ducts were obtained, and three-dimensional MRCP images were rendered by post processing. CONTRAST:  9mL GADAVIST GADOBUTROL 1 MMOL/ML IV SOLN COMPARISON:  Earlier same day CT abdomen and pelvis, MRI abdomen dated 03/04/2024 FINDINGS: Lower chest: No acute findings. Hepatobiliary: Left hepatic atrophy. No focal hepatic lesions. Similar severe intrahepatic bile duct dilation with increased abrupt caliber narrowing of the hepatic duct at the confluence with the cystic duct (14:39). Persistent distended gallbladder now demonstrates circumferential mural thickening. Pancreas: Unchanged severely dilated and tortuous main pancreatic  duct upstream of an ill-defined, expansile mass in the pancreatic head and neck measuring approximately 4.1 x 2.5 cm (19:49), not substantially changed from 03/04/2024. Spleen:  Within normal limits in size and appearance. Adrenals/Urinary Tract: No adrenal nodules. No suspicious renal masses identified. No evidence of hydronephrosis. Bilateral simple cysts. Stomach/Bowel: Markedly distended stomach as seen on earlier same day CT. Vascular/Lymphatic: No pathologically enlarged lymph nodes identified. No abdominal aortic aneurysm demonstrated. Vascular involvement, as before. Other: Small volume ascites and diffuse body wall edema, increased from 03/04/2024. Musculoskeletal: No suspicious bone lesions identified. IMPRESSION: 1. Similar severe intrahepatic bile duct dilation with increased abrupt caliber narrowing of the hepatic duct at the confluence with the cystic duct. 2. Persistent distended gallbladder now demonstrates circumferential mural thickening, which may be reactive or reflect acute cholecystitis. 3. Unchanged severely dilated and tortuous main pancreatic duct upstream of an ill-defined, expansile mass in the pancreatic head and neck measuring approximately 4.1 x 2.5 cm, not substantially changed from 03/04/2024. 4. Markedly distended stomach as seen on earlier same day CT. 5. Small volume ascites and diffuse body wall edema, increased from 03/04/2024. Electronically Signed   By: Limin  Xu M.D.   On: 03/14/2024 20:38   MR 3D Recon At Scanner Result Date: 03/14/2024 CLINICAL DATA:  Known history of colon and pancreatic cancer with worsening abdominal pain, constipation EXAM: MRI ABDOMEN WITHOUT AND WITH CONTRAST (INCLUDING MRCP) TECHNIQUE: Multiplanar multisequence MR imaging of the abdomen was performed both before and after the administration of intravenous contrast. Heavily T2-weighted images of the biliary and pancreatic ducts were obtained, and three-dimensional MRCP images were rendered by post  processing. CONTRAST:  9mL GADAVIST GADOBUTROL 1 MMOL/ML IV SOLN COMPARISON:  Earlier same day CT abdomen and pelvis, MRI abdomen dated 03/04/2024 FINDINGS: Lower chest: No acute findings. Hepatobiliary: Left hepatic atrophy. No focal hepatic lesions. Similar severe intrahepatic bile duct dilation with increased abrupt caliber narrowing of the hepatic duct at the confluence with the cystic duct (14:39). Persistent distended gallbladder now demonstrates circumferential mural thickening. Pancreas: Unchanged severely dilated and tortuous main pancreatic duct upstream of an ill-defined, expansile mass in the pancreatic head and neck measuring approximately 4.1 x 2.5 cm (19:49), not substantially changed from 03/04/2024. Spleen:  Within normal limits in size and appearance. Adrenals/Urinary Tract: No adrenal nodules. No suspicious renal masses identified. No evidence of hydronephrosis. Bilateral simple cysts. Stomach/Bowel: Markedly distended stomach as seen on earlier same day CT. Vascular/Lymphatic: No pathologically enlarged lymph nodes identified. No abdominal aortic aneurysm demonstrated. Vascular involvement, as before. Other: Small volume ascites and diffuse body wall edema, increased from 03/04/2024. Musculoskeletal: No suspicious bone lesions identified. IMPRESSION: 1. Similar severe intrahepatic bile duct dilation with increased abrupt caliber narrowing of the hepatic duct at the confluence with the cystic duct. 2. Persistent distended gallbladder now demonstrates circumferential mural thickening, which may be reactive or reflect acute cholecystitis. 3. Unchanged severely dilated and tortuous main pancreatic duct upstream of an ill-defined, expansile mass in the pancreatic head and neck measuring approximately 4.1 x 2.5 cm, not substantially changed from 03/04/2024. 4. Markedly distended stomach as seen on earlier same day CT.  5. Small volume ascites and diffuse body wall edema, increased from 03/04/2024.  Electronically Signed   By: Limin  Xu M.D.   On: 03/14/2024 20:38   CT ABDOMEN PELVIS W CONTRAST Result Date: 03/14/2024 CLINICAL DATA:  Constipation, short of breath, bilateral lower extremity edema, history of colon cancer, recent stent placement for obstructive jaundice EXAM: CT ABDOMEN AND PELVIS WITH CONTRAST TECHNIQUE: Multidetector CT imaging of the abdomen and pelvis was performed using the standard protocol following bolus administration of intravenous contrast. RADIATION DOSE REDUCTION: This exam was performed according to the departmental dose-optimization program which includes automated exposure control, adjustment of the mA and/or kV according to patient size and/or use of iterative reconstruction technique. CONTRAST:  OMNIPAQUE  IOHEXOL  300 MG/ML  SOLN COMPARISON:  03/04/2024, 02/27/2024 FINDINGS: Lower chest: No acute pleural or parenchymal lung disease. Stable cystic changes in the right lower lobe with bibasilar bronchiectasis again noted. Hepatobiliary: There is progressive intrahepatic and extrahepatic biliary duct dilation despite interval placement of a biliary stent. The proximal aspect of the stent is within the right common hepatic duct, and distal aspect within the downstream common bile duct. The tip does not appear to extend into the lumen of the duodenum, and may not provide adequate decompression of the biliary tree. Marked distension of the gallbladder with no evidence of acute cholecystitis. No acute parenchymal liver abnormality. Pancreas: Complex cystic and solid mass at the junction of the head and body of the pancreas again noted, measuring approximately 3.1 x 2.7 cm, previously evaluated by MRI. There is upstream pancreatic duct dilation and atrophy of the parenchyma within the distal body and tail the pancreas, stable. No acute inflammatory changes. Spleen: Normal in size without focal abnormality. Adrenals/Urinary Tract: The adrenals are stable. Bilateral renal  cortical and peripelvic cysts do not require specific imaging follow-up. The kidneys enhance normally. Bladder is unremarkable. Stomach/Bowel: No bowel obstruction or ileus. Normal appendix right lower quadrant. Postsurgical changes from previous partial colon resection. No bowel wall thickening or acute inflammatory changes. Distended fluid-filled stomach is noted, consideration could be given to decompression with enteric catheter. Vascular/Lymphatic: Aortic atherosclerosis. 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 No enlarged abdominal or pelvic lymph nodes. Reproductive: Stable enlargement of the prostate. Other: Trace ascites most pronounced in the right upper quadrant and left paracolic gutter. No free intraperitoneal gas. No abdominal wall hernia. Musculoskeletal: No acute or destructive bony abnormalities. Reconstructed images demonstrate no additional findings. IMPRESSION: 1. Progressive intrahepatic and extrahepatic biliary duct dilation despite interval placement of bile duct stent. The downstream aspect of the biliary stent projects near the ampulla not clearly within the lumen of the duodenum, and may not provide adequate decompression of the biliary tree. 2. Marked gallbladder distension without evidence of cholelithiasis or acute cholecystitis. 3. Stable complex cystic and solid mass within the pancreatic body, with continued extrinsic compression upon the portal vein, upstream pancreatic duct dilation, and atrophy of the pancreatic body and tail. This has been previously evaluated by MRI. 4. Distended fluid-filled stomach, which may require decompression with enteric catheter. No evidence of bowel obstruction or ileus. 5. Trace ascites. 6.  Aortic Atherosclerosis (ICD10-I70.0). Electronically Signed   By: Ozell Daring M.D.   On: 03/14/2024 16:04   DG Chest Portable 1 View Result Date:  03/14/2024 EXAM: 1 VIEW(S) XRAY OF THE CHEST 03/14/2024 01:09:00 PM COMPARISON: None available. CLINICAL HISTORY: sob FINDINGS: LINES, TUBES  AND DEVICES: Right IJ chest port in place with tip terminating in right atrium. LUNGS AND PLEURA: Low lung volumes. Bibasilar airspace opacities. No pulmonary edema. No pleural effusion. No pneumothorax. HEART AND MEDIASTINUM: Atherosclerotic calcifications. No acute abnormality of the cardiac and mediastinal silhouettes. BONES AND SOFT TISSUES: Degenerative changes of the shoulders. No acute osseous abnormality. IMPRESSION: 1. Low lung volumes with bibasilar airspace opacities, which may reflect atelectasis or pneumonia. Electronically signed by: Norleen Boxer MD 03/14/2024 01:48 PM EDT RP Workstation: [id number]   DG C-Arm 1-60 Min-No Report Result Date: 03/04/2024 Fluoroscopy was utilized by the requesting physician.  No radiographic interpretation.   MR ABDOMEN MRCP W WO CONTAST Result Date: 03/04/2024 EXAM: MRCP WITH AND WITHOUT IV CONTRAST 03/04/2024 01:05:40 AM TECHNIQUE: Multisequence, multiplanar magnetic resonance images of the abdomen with and without intravenous contrast. MRCP sequences were performed. 8 mL gadobutrol (GADAVIST) 1 MMOL/ML injection. COMPARISON: CT 02/27/2024 exam detail is diminished by motion artifact. CLINICAL HISTORY: Jaundice. Patient sent to ED by Dr Babara due to pt having a distended gallbladder and is jaundice. Currently has colon cancer. FINDINGS: LIVER: As noted previously, there is atrophy involving the left lobe of liver. GALLBLADDER AND BILIARY SYSTEM: Moderate gallbladder distention. No significant gallbladder wall thickening. No gallstones identified. Marked intrahepatic bile duct dilatation. The proximal common bile duct measures up to 1.7 cm in diameter, image 20/3. No choledocholithiasis. Abrupt termination of the dilated common bile duct is noted at the level of the head of the pancreas. SPLEEN: Unremarkable.  PANCREAS/PANCREATIC DUCT: There is main duct dilatation of the pancreas from the body through the tail. Obscured by motion artifact, a mass is suspected between the head and body of pancreas which obstructs the common bile duct and the main pancreatic duct as suggested on axial image 16/26 and coronal image 20/3. This measures approximately 4.0 x 2.6 cm, image 36/80. ADRENAL GLANDS: Normal size and morphology bilaterally. No nodule, thickening, or hemorrhage. No periadrenal stranding. KIDNEYS: Bilateral kidney cysts. No follow-up imaging recommended. The largest is in the lower pole of the right kidney measuring 2.8 cm, image 48/25. LYMPH NODES: No upper abdominal adenopathy. VASCULATURE: There appears to be encasement of the superior mesenteric artery. There is also encasement of the celiac artery. 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 Aortic atherosclerotic calcification. PERITONEUM: Trace ascites. A small volume of free fluid extends over the liver. ABDOMINAL WALL: No hernia. No mass. BOWEL: There is gaseous distention of the colon up to the level of the rectum. No bowel obstruction. BONES: No acute abnormality or worrisome osseous lesion. SOFT TISSUES: Unremarkable. MISCELLANEOUS: Unremarkable. IMPRESSION: 1. Pancreatic mass between the head and body measuring approximately 4.0 x 2.6 cm causing obstruction of the common bile duct and main pancreatic duct; evaluation limited by motion artifact. 2. Vascular involvement with encasement of the superior mesenteric artery and celiac artery. 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 Electronically signed by: Waddell Calk MD 03/04/2024 06:02 AM EDT RP Workstation: [id number]   MR 3D Recon At Scanner Result Date: 03/04/2024 EXAM: MRCP WITH AND WITHOUT IV CONTRAST 03/04/2024 01:05:40 AM TECHNIQUE: Multisequence, multiplanar magnetic resonance images of the abdomen with and  without intravenous contrast. MRCP sequences were performed. 8 mL gadobutrol (GADAVIST) 1 MMOL/ML injection. COMPARISON: CT 02/27/2024 exam detail is diminished by motion artifact. CLINICAL HISTORY: Jaundice. Patient sent to ED by Dr Babara due to pt having a distended gallbladder and is jaundice. Currently has colon cancer. FINDINGS: LIVER: As noted  previously, there is atrophy involving the left lobe of liver. GALLBLADDER AND BILIARY SYSTEM: Moderate gallbladder distention. No significant gallbladder wall thickening. No gallstones identified. Marked intrahepatic bile duct dilatation. The proximal common bile duct measures up to 1.7 cm in diameter, image 20/3. No choledocholithiasis. Abrupt termination of the dilated common bile duct is noted at the level of the head of the pancreas. SPLEEN: Unremarkable. PANCREAS/PANCREATIC DUCT: There is main duct dilatation of the pancreas from the body through the tail. Obscured by motion artifact, a mass is suspected between the head and body of pancreas which obstructs the common bile duct and the main pancreatic duct as suggested on axial image 16/26 and coronal image 20/3. This measures approximately 4.0 x 2.6 cm, image 36/80. ADRENAL GLANDS: Normal size and morphology bilaterally. No nodule, thickening, or hemorrhage. No periadrenal stranding. KIDNEYS: Bilateral kidney cysts. No follow-up imaging recommended. The largest is in the lower pole of the right kidney measuring 2.8 cm, image 48/25. LYMPH NODES: No upper abdominal adenopathy. VASCULATURE: There appears to be encasement of the superior mesenteric artery. There is also encasement of the celiac artery. 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 Aortic atherosclerotic calcification. PERITONEUM: Trace ascites. A small volume of free fluid extends over the liver. ABDOMINAL WALL: No hernia. No mass. BOWEL: There is gaseous distention of the colon up to the level of the rectum. No  bowel obstruction. BONES: No acute abnormality or worrisome osseous lesion. SOFT TISSUES: Unremarkable. MISCELLANEOUS: Unremarkable. IMPRESSION: 1. Pancreatic mass between the head and body measuring approximately 4.0 x 2.6 cm causing obstruction of the common bile duct and main pancreatic duct; evaluation limited by motion artifact. 2. Vascular involvement with encasement of the superior mesenteric artery and celiac artery. 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 Electronically signed by: Waddell Calk MD 03/04/2024 06:02 AM EDT RP Workstation: [id number]   CT CHEST ABDOMEN PELVIS W CONTRAST Result Date: 02/27/2024 CLINICAL DATA:  colon cancer. * Tracking Code: BO * EXAM: CT CHEST, ABDOMEN, AND PELVIS WITH CONTRAST TECHNIQUE: Multidetector CT imaging of the chest, abdomen and pelvis was performed following the standard protocol during bolus administration of intravenous contrast. RADIATION DOSE REDUCTION: This exam was performed according to the departmental dose-optimization program which includes automated exposure control, adjustment of the mA and/or kV according to patient size and/or use of iterative reconstruction technique. CONTRAST:  OMNIPAQUE  IOHEXOL  300 MG/ML  SOLN COMPARISON:  CT scan abdomen and pelvis from 12/04/2023 and CT scan chest, abdomen and pelvis from 10/30/2023. FINDINGS: CT CHEST FINDINGS Cardiovascular: Normal cardiac size. No pericardial effusion. No aortic aneurysm. There are coronary artery calcifications, in keeping with coronary artery disease. There are also mild peripheral atherosclerotic vascular calcifications of thoracic aorta and its major branches. Aberrant origin of right subclavian artery noted, which arises distal to the left subclavian artery and courses towards the right side, posterior to the esophagus. Mediastinum/Nodes: Visualized thyroid  gland appears grossly unremarkable. No solid / cystic mediastinal masses. The  esophagus is nondistended precluding optimal assessment. No axillary, mediastinal or hilar lymphadenopathy by size criteria. Lungs/Pleura: The central tracheo-bronchial tree is patent. Redemonstration of combination of emphysematous changes as well as bronchiectatic changes mainly at bilateral lung bases, right more than left. No mass or consolidation. No pleural effusion or pneumothorax. There is a new, 3 x 5 mm solid noncalcified nodule in the left upper lobe (series 3, image 49). There is a stable previously seen sub 4 mm noncalcified nodule in  the right upper lobe (series 3, image 56). Musculoskeletal: A CT Port-a-Cath is seen in the right upper chest wall with the catheter terminating in the right atrium. Visualized soft tissues of the chest wall are otherwise grossly unremarkable. No suspicious osseous lesions. There are mild multilevel degenerative changes in the visualized spine. CT ABDOMEN PELVIS FINDINGS Hepatobiliary: The liver is normal in size. Non-cirrhotic configuration. No suspicious mass. Since the prior study, there is new moderate intrahepatic bile duct dilation as well as moderate-to-severe extrahepatic bile duct dilation. Gallbladder is distended measuring up to 5.7 cm in width. No abnormal wall thickening or pericholecystic fat stranding. No calcified gallstones. Pancreas: Redemonstration of heterogeneous appearance of the pancreas. There is a relatively well-circumscribed 2.1 x 2.2 cm hypoattenuating lesion in the proximal body, which appears grossly similar to the prior study. Upstream to it, there is atrophy of pancreas with prominent main pancreatic duct however, grossly similar to the prior study. There is diffuse mild atrophy of the pancreatic head/uncinate process as well. There is a single dystrophic calcification in the pancreatic body, nonspecific but commonly seen as a sequela of chronic pancreatitis. However, no acute peripancreatic fat stranding noted. Spleen: Within normal limits.  No focal lesion. Adrenals/Urinary Tract: Adrenal glands are unremarkable. No suspicious renal mass. There are at least 2 simple cysts in the right kidney lower pole with larger cyst measuring up to 2.9 x 4.2 cm. There is a sinus cyst in the left kidney interpolar region. No nephroureterolithiasis or obstructive uropathy. Urinary bladder is under distended, precluding optimal assessment. However, no large mass or stones identified. No perivesical fat stranding. Stomach/Bowel: No disproportionate dilation of the small or large bowel loops. There is apparent mild irregular wall thickening of the left paramedian transverse colon (series 2, image 58), which is likely accentuated by underdistention. Findings are nonspecific but can be seen with primary or secondary colitis or other etiologies including subserosal metastatic deposits or primary malignancy. Correlate clinically. This area is new since the prior study from 12/04/2023. No other evidence of abnormal bowel wall thickening or inflammatory changes. The appendix was not visualized; however there is no acute inflammatory process in the right lower quadrant. Vascular/Lymphatic: No ascites or pneumoperitoneum. No abdominal or pelvic lymphadenopathy, by size criteria. No aneurysmal dilation of the major abdominal arteries. Reproductive: Enlarged prostate. Symmetric seminal vesicles. Other: Midline surgical scar noted. There is a small fat containing umbilical hernia. The soft tissues and abdominal wall are otherwise unremarkable. Musculoskeletal: No suspicious osseous lesions. There are mild multilevel degenerative changes in the visualized spine. IMPRESSION: 1. There is a new 3 x 5 mm solid noncalcified nodule in the left upper lobe. Otherwise, no metastatic disease identified within the chest. 2. There is apparent mild irregular wall thickening of the left paramedian transverse colon, which is likely accentuated by underdistention. Findings are nonspecific but can  be seen with primary or secondary colitis or other etiologies including neoplastic process. 3. There is new moderate intrahepatic bile duct dilation as well as moderate-to-severe extrahepatic bile duct dilation. Gallbladder is distended measuring up to 5.7 cm in width. No abnormal wall thickening or pericholecystic fat stranding. No calcified gallstones. Correlate clinically and with liver function tests to determine the need for further evaluation with MRI/MRCP. 4. Redemonstration of heterogeneous hypoattenuating mass centered in the proximal pancreatic body. 5. Multiple other nonacute observations, as described above. Electronically Signed   By: Ree Molt M.D.   On: 02/27/2024 15:02     Assessment and plan-   # Recurrent  obstructive jaundice secondary to pancreatic mass,  Failed repeat ERCP with stent exchange. I agree with consulting IR for evaluation of placement of percutaneous biliary tube placement. Monitor bilirubin level.  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  Currently on chemo break. Patient's family members understand that his condition is not curable.   if his condition continues to decline, hospice/comfort care is appropriate.  Hopefully his condition may improve after bilirubin trends down with the percutaneous biliary drainage.  If he is able to improve and recover to acceptable condition for further treatments, he will follow-up outpatient for further discussion.  # Septic shock secondary to acute cholangitis/cholecystitis, off pressor currently.  Continue IV antibiotics. Surgery recommendation was reviewed.  Not a surgical candidate Low suspicion for acute cholecystitis.  Changes of the gallbladder are sequela of his obstructive process. Continue medical management.  Thank you for allowing me to participate in the care of this patient.   Zelphia Cap, MD, PhD Hematology Oncology 03/18/2024

## 2024-03-18 NOTE — Progress Notes (Signed)
 Rogelia Copping, MD [facility]   [address], [address] [place], [place] [address] Phone: [phone] Fax : [phone]   Subjective: The patient's white cell jumped up again today but the INR continues to decrease.  The patient is now off of pressors.  A liver function panel was not sent this morning but has been ordered.  A CT scan of the abdomen was done yesterday that showed:  IMPRESSION: 1. Multifactorial degradation, including lack of IV contrast and patient arm position. 2. Development of fluid overload, with new bilateral pleural effusions, increased anasarca, and ascites. 3. No evidence of bowel obstruction. 4. Repositioning or replacement of common bile duct stent with new pneumobilia indicative of stent patency. Persistent gallbladder distention with suggestion of pericholecystic edema. Cannot exclude acute cholecystitis. Consider right upper quadrant ultrasound. 5. New bibasilar airspace disease, suspicious for infection or aspiration. 6. Placement of a nasogastric tube with resolution of gastric distention.  An ultrasound for follow-up was then done that showed:  IMPRESSION: 1. Similar fluid-filled dilation of the gallbladder. Circumferential wall thickening and gallbladder wall edema, which in the absence of a sonographic Murphy's sign and gallstones, may be related to upper abdominal inflammation, acute hepatitis, or volume overload from either CHF or renal failure. 2. Extensive pneumobilia again noted. 3. Small volume perihepatic ascites.  Right pleural effusion   Objective: Vital signs in last 24 hours: Vitals:   03/18/24 0410 03/18/24 0500 03/18/24 0600 03/18/24 0700  BP: 125/79 103/66 125/74 113/62  Pulse: (!) 55 (!) 52 63 (!) 56  Resp: 16 11 13 11   Temp: (!) 96.6 F (35.9 C) (!) 96.6 F (35.9 C) (!) 96.4 F (35.8 C) (!) 96.6 F (35.9 C)  TempSrc: Rectal     SpO2: 96% 95% 95% 96%  Weight:      Height:       Weight change:   Intake/Output Summary (Last 24  hours) at 03/18/2024 9167 Last data filed at 03/18/2024 0600 Gross per 24 hour  Intake 586 ml  Output 535 ml  Net 51 ml     Exam: General: The patient continues to look sick but still alert and responsive.   Lab Results: @LABTEST2 @ Micro Results: Recent Results (from the past 240 hours)  Urine Culture (for pregnant, neutropenic or urologic patients or patients with an indwelling urinary catheter)     Status: None   Collection Time: 03/14/24  3:26 PM   Specimen: Urine, Catheterized  Result Value Ref Range Status   Specimen Description   Final    URINE, CATHETERIZED Performed at [facility], [address] [place], [place] [address]    Special Requests   Final    NONE Performed at [facility], [address], [place], [place] [address]    Culture   Final    NO GROWTH Performed at [facility], [address] [place], [place] [address]    Report Status 03/16/2024 FINAL  Final  MRSA Next Gen by PCR, Nasal     Status: None   Collection Time: 03/15/24  5:08 PM   Specimen: Nasal Mucosa; Nasal Swab  Result Value Ref Range Status   MRSA by PCR Next Gen NOT DETECTED NOT DETECTED Final    Comment: (NOTE) The GeneXpert MRSA Assay (FDA approved for NASAL specimens only), is one component of a comprehensive MRSA colonization surveillance program. It is not intended to diagnose MRSA infection nor to guide or monitor treatment for MRSA infections. Test performance is not FDA  approved in patients less than 68 years old. Performed at [facility], [address] [place], [place] [address]   Culture, blood (Routine X 2) w Reflex to ID Panel     Status: None (Preliminary result)   Collection Time: 03/16/24 12:14 PM   Specimen: BLOOD LEFT HAND  Result Value Ref Range Status   Specimen Description BLOOD LEFT HAND  Final   Special Requests   Final    BOTTLES DRAWN AEROBIC AND ANAEROBIC Blood Culture adequate volume   Culture   Final    NO GROWTH  2 DAYS Performed at [facility], [address], [place], [place] [address]    Report Status PENDING  Incomplete  Culture, blood (Routine X 2) w Reflex to ID Panel     Status: None (Preliminary result)   Collection Time: 03/16/24 12:21 PM   Specimen: BLOOD LEFT ARM  Result Value Ref Range Status   Specimen Description BLOOD LEFT ARM  Final   Special Requests   Final    BOTTLES DRAWN AEROBIC AND ANAEROBIC Blood Culture adequate volume   Culture   Final    NO GROWTH 2 DAYS Performed at [facility], [address] [place], [place] [address]    Report Status PENDING  Incomplete   Studies/Results: US  Abdomen Limited RUQ (LIVER/GB) Result Date: 03/17/2024 CLINICAL DATA:  6194 Acute cholecystitis 575.0.ICD-9-CM EXAM: ULTRASOUND ABDOMEN LIMITED RIGHT UPPER QUADRANT COMPARISON:  03/17/2024, 03/14/2024, 02/27/2024 FINDINGS: Gallbladder: Similar diffuse dilation of the gallbladder without shadowing gallstones. Circumferential wall thickening and gallbladder wall edema noted. No sonographic Murphy's sign noted by sonographer. Common bile duct: Obscured by overlying bowel gas. Liver: Normal echogenicity. No focal lesion identified. Extensive pneumobilia again noted. Portal vein is patent on color Doppler imaging with normal direction of blood flow towards the liver. Other: Small volume perihepatic ascites again noted IMPRESSION: 1. Similar fluid-filled dilation of the gallbladder. Circumferential wall thickening and gallbladder wall edema, which in the absence of a sonographic Murphy's sign and gallstones, may be related to upper abdominal inflammation, acute hepatitis, or volume overload from either CHF or renal failure. 2. Extensive pneumobilia again noted. 3. Small volume perihepatic ascites.  Right pleural effusion. Electronically Signed   By: Rogelia Myers M.D.   On: 03/17/2024 18:36   CT ABDOMEN PELVIS WO CONTRAST Result Date: 03/17/2024 EXAM: CT ABDOMEN AND PELVIS WITHOUT  CONTRAST 03/17/2024 04:53:03 PM TECHNIQUE: CT of the abdomen and pelvis was performed without the administration of intravenous contrast. Multiplanar reformatted images are provided for review. Automated exposure control, iterative reconstruction, and/or weight-based adjustment of the mA/kV was utilized to reduce the radiation dose to as low as reasonably achievable. Multifactorial degradation, including lack of IV contrast and patient arm position, not raised above the head. COMPARISON: MRI of 03/14/2024. CT of 03/14/2024. CLINICAL HISTORY: Bowel obstruction suspected. History of pancreatic and colon carcinoma. * Tracking Code: BO * FINDINGS: LOWER CHEST: Bilateral lower lobe bullous emphysema with new medial bilateral lower lobe consolidation. New small bilateral pleural effusions. Right ventricular central line. LIVER: Limited evaluation of the liver. New pneumobilia suggesting stent patency in the setting of common duct stent. GALLBLADDER AND BILE DUCTS: Persistent moderate gallbladder distention with suggestion of pericholecystic edema including image 37/10. The common duct stent terminates more distally, in the inferior genu of the duodenum. SPLEEN: No acute abnormality. PANCREAS: The pancreatic head/neck lesion is poorly evaluated but grossly similar at 2.8 x 2.2 cm on image 41/3. ADRENAL GLANDS: No acute abnormality. KIDNEYS, URETERS AND  BLADDER: Contrast remains within the kidneys from prior contrast-enhanced exams. Left renal cortical scarring in the lower pole. Right renal cyst of up to 2.5 cm. No follow up indicated. No hydronephrosis. Foley catheter decompresses the urinary bladder. GI AND BOWEL: Placement of a nasogastric tube which terminates in the gastric body. resolved gastric distention. Normal small bowel caliber. Appendix not visualized. There is no bowel obstruction. PERITONEUM AND RETROPERITONEUM: Increase in small volume abdominal pelvic ascites. No free intraperitoneal air. VASCULATURE:  Aorta is normal in caliber. Aortic atherosclerosis. LYMPH NODES: Prominent but not pathologically sized abdominal retroperitoneal nodes are likely reactive. REPRODUCTIVE ORGANS: No acute abnormality. BONES AND SOFT TISSUES: Degenerative changes of both hips. Increased diffuse anasarca. Small bore right sided femoral venous catheter. No acute osseous abnormality. No focal soft tissue abnormality. IMPRESSION: 1. Multifactorial degradation, including lack of IV contrast and patient arm position. 2. Development of fluid overload, with new bilateral pleural effusions, increased anasarca, and ascites. 3. No evidence of bowel obstruction. 4. Repositioning or replacement of common bile duct stent with new pneumobilia indicative of stent patency. Persistent gallbladder distention with suggestion of pericholecystic edema. Cannot exclude acute cholecystitis. Consider right upper quadrant ultrasound. 5. New bibasilar airspace disease, suspicious for infection or aspiration. 6. Placement of a nasogastric tube with resolution of gastric distention. Electronically signed by: Rockey Kilts MD 03/17/2024 05:38 PM EST RP Workstation: [id number]   Medications: I have reviewed the patient's current medications. Scheduled Meds:  Chlorhexidine  Gluconate Cloth  6 each Topical Daily   feeding supplement  237 mL Oral BID BM   furosemide   40 mg Intravenous Daily   hydrocortisone sod succinate (SOLU-CORTEF) inj  50 mg Intravenous Q8H   midodrine   10 mg Per Tube TID WC   pantoprazole  (PROTONIX ) IV  40 mg Intravenous Daily   potassium chloride   20 mEq Per Tube Once   sodium chloride  flush  10-40 mL Intracatheter Q12H   Continuous Infusions:  ceFEPime  (MAXIPIME ) IV Stopped (03/18/24 0546)   metronidazole  500 mg (03/18/24 0608)   PRN Meds:.acetaminophen  **OR** acetaminophen , magnesium  hydroxide, mineral oil-hydrophilic petrolatum, morphine  injection, ondansetron  **OR** ondansetron  (ZOFRAN ) IV, sodium chloride  flush,  traZODone   Assessment: Principal Problem:   Obstructive jaundice (HCC) Active Problems:   Hypokalemia   BPH (benign prostatic hyperplasia)   GERD without esophagitis   Pancreatic cancer (HCC)   Protein-calorie malnutrition, severe    Plan: The patient will have his liver functions checked again today.  The patient's INR and sepsis appears to be improving and edging from yesterday showed the patient to have what appears to be patency of the stents placed in the common bile duct.  I recommend continued supportive treatment. Nothing further to add from a GI point of view at this time.   LOS: 4 days   Rogelia Copping, MD.FACG 03/18/2024, 8:32 AM Pager [phone] 7am-5pm  Check AMION for 5pm -7am coverage and on weekends

## 2024-03-18 NOTE — Plan of Care (Signed)
  Problem: Fluid Volume: Goal: Hemodynamic stability will improve Outcome: Progressing   Problem: Clinical Measurements: Goal: Diagnostic test results will improve Outcome: Progressing   Problem: Respiratory: Goal: Ability to maintain adequate ventilation will improve Outcome: Progressing   Problem: Clinical Measurements: Goal: Ability to maintain clinical measurements within normal limits will improve Outcome: Progressing Goal: Diagnostic test results will improve Outcome: Progressing Goal: Respiratory complications will improve Outcome: Progressing   Problem: Activity: Goal: Risk for activity intolerance will decrease Outcome: Progressing   Problem: Elimination: Goal: Will not experience complications related to bowel motility Outcome: Progressing   Problem: Pain Managment: Goal: General experience of comfort will improve and/or be controlled Outcome: Progressing   Problem: Safety: Goal: Ability to remain free from injury will improve Outcome: Progressing

## 2024-03-18 NOTE — Consult Note (Signed)
 Chief Complaint: Patient was seen in consultation today for persistent biliary obstruction s/p stent placement in the setting of pancreatic cancer, and with consideration for biliary drain placement.  Referring Provider(s): Dr. Halina Picking, MD   Supervising Physician: Jenna Hacker  Patient Status: [facility] - In-pt  Patient is DNR/DNI Discussion with the patient and family regarding wishes.  The original DNR order is maintained and prior treatment limitations are upheld during the procedure.   History of Present Illness: Jared Gutierrez is a 83 y.o. male  with PMHx notable for colon cancer s/p radiation (2024) and chemotherapy (last treatment on 10/2023), now with biliary obstruction secondary to metastatic pancreatic mass s/p ERCP on 03/04/24. Other PMHx as delineated below.  Patient developed obstructive jaundice and was diagnosed with metastatic pancreatic mass. He presented to ED on 10/31 with complaints of SOB, and labs were concerning for progressive leukocytosis and worsening hyperbilirubinemia. He was admitted, and a consideration for transfer to [facility] was declined due to safety concerns with elevated INR (4.5). Patient underwent repeat ERCP on 11/1 with stent exchange, complicated by a brief episode of pulseless electrical activity (PEA) upon intubation. Most recent labs show persistent leukocytosis (WBC 20.4K), Hgb 9.7, PLT 138K, sCr - 1.88, INR improving to 1.8 (peak:4.5), and bilirubin again increasing 18.3. Interventional Radiology was therefore requested for biliary drain placement. Request was reviewed and approved by Dr. Jenna, who does note placement may be notably difficult. Patient is tentatively scheduled for same in IR tomorrow.   Patient is intermittently alert, laying in bed, calm. His wife and daughter are at his beside.  ROS and HPI cannot be accurately assessed at this time.   Past Medical History:  Diagnosis Date   Arthritis    OSTEOARTHRITIS   Cancer  (HCC)    Cavitary lesion of lung    RIGHT LOWER LOBE   Chicken pox    Colon cancer (HCC)    History of kidney stones    Hypertension    Lipoma of colon    Nephrolithiasis    Nephrolithiasis    Obesity    Shingles    Tubular adenoma of colon    multiple fragments    Past Surgical History:  Procedure Laterality Date   BILIARY STENT PLACEMENT  03/04/2024   Procedure: INSERTION, STENT, BILE DUCT;  Surgeon: Jinny Carmine, MD;  Location: [facility] ENDOSCOPY;  Service: Endoscopy;;   BILIARY STENT PLACEMENT  03/15/2024   Procedure: INSERTION, STENT, BILE DUCT;  Surgeon: Jinny Carmine, MD;  Location: [facility] ENDOSCOPY;  Service: Endoscopy;;   COLON SURGERY     COLONOSCOPY N/A 10/02/2014   Procedure: COLONOSCOPY;  Surgeon: Donnice Vaughn Manes, MD;  Location: [facility] ENDOSCOPY;  Service: Endoscopy;  Laterality: N/A;   COLONOSCOPY WITH PROPOFOL  N/A 02/16/2017   Procedure: COLONOSCOPY WITH PROPOFOL ;  Surgeon: Viktoria Lamar DASEN, MD;  Location: [facility] ENDOSCOPY;  Service: Endoscopy;  Laterality: N/A;   COLONOSCOPY WITH PROPOFOL  N/A 10/05/2020   Procedure: COLONOSCOPY WITH PROPOFOL ;  Surgeon: Maryruth Ole DASEN, MD;  Location: [facility] ENDOSCOPY;  Service: Endoscopy;  Laterality: N/A;   ERCP N/A 03/04/2024   Procedure: ERCP, WITH INTERVENTION IF INDICATED;  Surgeon: Jinny Carmine, MD;  Location: [facility] ENDOSCOPY;  Service: Endoscopy;  Laterality: N/A;   ERCP N/A 03/15/2024   Procedure: ERCP, WITH INTERVENTION IF INDICATED;  Surgeon: Jinny Carmine, MD;  Location: [facility] ENDOSCOPY;  Service: Endoscopy;  Laterality: N/A;   ESOPHAGOGASTRODUODENOSCOPY N/A 12/31/2023   Procedure: EGD (ESOPHAGOGASTRODUODENOSCOPY);  Surgeon: Wilhelmenia Aloha Raddle., MD;  Location:  [facility] ENDOSCOPY;  Service: Gastroenterology;  Laterality: N/A;   EUS N/A 04/17/2019   Procedure: FULL UPPER ENDOSCOPIC ULTRASOUND (EUS) RADIAL;  Surgeon: Queenie Asberry LABOR, MD;  Location: [facility] ENDOSCOPY;  Service: Gastroenterology;  Laterality: N/A;   EUS N/A 12/31/2023    Procedure: ULTRASOUND, UPPER GI TRACT, ENDOSCOPIC;  Surgeon: Wilhelmenia Aloha Raddle., MD;  Location: [facility] ENDOSCOPY;  Service: Gastroenterology;  Laterality: N/A;   FINE NEEDLE ASPIRATION  12/31/2023   Procedure: FINE NEEDLE ASPIRATION;  Surgeon: Wilhelmenia Aloha Raddle., MD;  Location: [facility] ENDOSCOPY;  Service: Gastroenterology;;   KIDNEY STONE SURGERY     PARTIAL COLECTOMY  10/17/2013   PORTACATH PLACEMENT Right 06/13/2019   Procedure: INSERTION PORT-A-CATH;  Surgeon: Tye Millet, DO;  Location: [facility] ORS;  Service: General;  Laterality: Right;   STENT REMOVAL  03/15/2024   Procedure: STENT REMOVAL;  Surgeon: Jinny Carmine, MD;  Location: [facility] ENDOSCOPY;  Service: Endoscopy;;    Allergies: Patient has no known allergies.  Medications: Prior to Admission medications   Medication Sig Start Date End Date Taking? Authorizing Provider  cholestyramine (QUESTRAN) 4 g packet Take 1 packet (4 g total) by mouth 3 (three) times daily for 14 days. 03/06/24 03/20/24 Yes Agbata, Tochukwu, MD  cyanocobalamin (VITAMIN B12) 500 MCG tablet Take by mouth. 07/26/23 07/25/24 Yes [provider]  feeding supplement (ENSURE PLUS HIGH PROTEIN) LIQD Take 237 mLs by mouth 2 (two) times daily between meals. 03/06/24 04/05/24 Yes Agbata, Tochukwu, MD  furosemide  (LASIX ) 20 MG tablet Take 1 tablet (20 mg total) by mouth daily as needed for edema. 03/12/24  Yes Babara Call, MD  omeprazole  (PRILOSEC) 20 MG capsule Take 20 mg by mouth daily. 12/25/23  Yes [provider]  ondansetron  (ZOFRAN ) 8 MG tablet Take 1 tablet (8 mg total) by mouth 2 (two) times daily as needed for refractory nausea / vomiting. Start on day 3 after chemotherapy. 11/16/20  Yes Babara Call, MD  pantoprazole  (PROTONIX ) 40 MG tablet Take 1 tablet (40 mg total) by mouth every morning. Take in the morning on an empty stomach 12/07/23 03/15/24 Yes Sreenath, Calvin NOVAK, MD  potassium chloride  SA (KLOR-CON  M) 20 MEQ tablet Take 1 tablet (20 mEq total) by mouth  daily. 03/12/24  Yes Babara Call, MD  Probiotic Product (PROBIOTIC DAILY PO) Take 1 tablet by mouth daily.   Yes [provider]  tamsulosin  (FLOMAX ) 0.4 MG CAPS capsule Take 0.4 mg by mouth daily after supper. 03/03/24  Yes [provider]  loratadine (CLARITIN) 10 MG tablet Take 10 mg by mouth daily. Patient not taking: Reported on 03/15/2024    [provider]     Family History  Problem Relation Age of Onset   Cancer Mother    Breast cancer Mother    COPD Father     Social History   Socioeconomic History   Marital status: Married    Spouse name: Not on file   Number of children: Not on file   Years of education: Not on file   Highest education level: Not on file  Occupational History   Not on file  Tobacco Use   Smoking status: Never   Smokeless tobacco: Never  Vaping Use   Vaping status: Never Used  Substance and Sexual Activity   Alcohol use: Not Currently    Comment: socially    Drug use: No   Sexual activity: Not on file  Other Topics Concern   Not on file  Social History Narrative   Not on file  Social Drivers of Corporate Investment Banker Strain: Not on file  Food Insecurity: Patient Unable To Answer (03/16/2024)   Hunger Vital Sign    Worried About Running Out of Food in the Last Year: Patient unable to answer    Ran Out of Food in the Last Year: Patient unable to answer  Transportation Needs: Patient Unable To Answer (03/16/2024)   PRAPARE - Transportation    Lack of Transportation (Medical): Patient unable to answer    Lack of Transportation (Non-Medical): Patient unable to answer  Physical Activity: Not on file  Stress: Not on file  Social Connections: Patient Unable To Answer (03/16/2024)   Social Connection and Isolation Panel    Frequency of Communication with Friends and Family: Patient unable to answer    Frequency of Social Gatherings with Friends and Family: Patient unable to answer    Attends Religious Services:  Patient unable to answer    Active Member of Clubs or Organizations: Patient unable to answer    Attends Banker Meetings: Patient unable to answer    Marital Status: Patient unable to answer     Review of Systems: A 12 point ROS discussed and pertinent positives are indicated in the HPI above.  All other systems are negative.  Vital Signs: BP (!) 99/52   Pulse (!) 59   Temp 98.2 F (36.8 C)   Resp 12   Ht 5' 8 (1.727 m)   Wt 196 lb 10.4 oz (89.2 kg)   SpO2 95%   BMI 29.90 kg/m   Advance Care Plan: The advanced care place/surrogate decision maker was discussed at the time of visit and the patient did not wish to discuss or was not able to name a surrogate decision maker or provide an advance care plan.  Physical Exam Constitutional:      Appearance: He is ill-appearing.     Comments: Patient is not alert on examination. Examination is severely limited.  HENT:     Head:     Comments: NGT in place    Mouth/Throat:     Comments: Patient does not respond to prompts. Unable to assess. Cardiovascular:     Rate and Rhythm: Normal rate and regular rhythm.     Pulses: Normal pulses.  Pulmonary:     Effort: Pulmonary effort is normal.     Breath sounds: Decreased breath sounds present.  Abdominal:     Comments: Unable to assess.  Musculoskeletal:     Comments: Unable to assess.  Skin:    General: Skin is warm and dry.  Neurological:     Mental Status: He is disoriented.  Psychiatric:     Comments: Unable to assess.     Imaging: US  Abdomen Limited RUQ (LIVER/GB) Result Date: 03/17/2024 CLINICAL DATA:  6194 Acute cholecystitis 575.0.ICD-9-CM EXAM: ULTRASOUND ABDOMEN LIMITED RIGHT UPPER QUADRANT COMPARISON:  03/17/2024, 03/14/2024, 02/27/2024 FINDINGS: Gallbladder: Similar diffuse dilation of the gallbladder without shadowing gallstones. Circumferential wall thickening and gallbladder wall edema noted. No sonographic Murphy's sign noted by sonographer. Common  bile duct: Obscured by overlying bowel gas. Liver: Normal echogenicity. No focal lesion identified. Extensive pneumobilia again noted. Portal vein is patent on color Doppler imaging with normal direction of blood flow towards the liver. Other: Small volume perihepatic ascites again noted IMPRESSION: 1. Similar fluid-filled dilation of the gallbladder. Circumferential wall thickening and gallbladder wall edema, which in the absence of a sonographic Murphy's sign and gallstones, may be related to upper abdominal inflammation, acute hepatitis, or  volume overload from either CHF or renal failure. 2. Extensive pneumobilia again noted. 3. Small volume perihepatic ascites.  Right pleural effusion. Electronically Signed   By: Rogelia Myers M.D.   On: 03/17/2024 18:36   CT ABDOMEN PELVIS WO CONTRAST Result Date: 03/17/2024 EXAM: CT ABDOMEN AND PELVIS WITHOUT CONTRAST 03/17/2024 04:53:03 PM TECHNIQUE: CT of the abdomen and pelvis was performed without the administration of intravenous contrast. Multiplanar reformatted images are provided for review. Automated exposure control, iterative reconstruction, and/or weight-based adjustment of the mA/kV was utilized to reduce the radiation dose to as low as reasonably achievable. Multifactorial degradation, including lack of IV contrast and patient arm position, not raised above the head. COMPARISON: MRI of 03/14/2024. CT of 03/14/2024. CLINICAL HISTORY: Bowel obstruction suspected. History of pancreatic and colon carcinoma. * Tracking Code: BO * FINDINGS: LOWER CHEST: Bilateral lower lobe bullous emphysema with new medial bilateral lower lobe consolidation. New small bilateral pleural effusions. Right ventricular central line. LIVER: Limited evaluation of the liver. New pneumobilia suggesting stent patency in the setting of common duct stent. GALLBLADDER AND BILE DUCTS: Persistent moderate gallbladder distention with suggestion of pericholecystic edema including image 37/10.  The common duct stent terminates more distally, in the inferior genu of the duodenum. SPLEEN: No acute abnormality. PANCREAS: The pancreatic head/neck lesion is poorly evaluated but grossly similar at 2.8 x 2.2 cm on image 41/3. ADRENAL GLANDS: No acute abnormality. KIDNEYS, URETERS AND BLADDER: Contrast remains within the kidneys from prior contrast-enhanced exams. Left renal cortical scarring in the lower pole. Right renal cyst of up to 2.5 cm. No follow up indicated. No hydronephrosis. Foley catheter decompresses the urinary bladder. GI AND BOWEL: Placement of a nasogastric tube which terminates in the gastric body. resolved gastric distention. Normal small bowel caliber. Appendix not visualized. There is no bowel obstruction. PERITONEUM AND RETROPERITONEUM: Increase in small volume abdominal pelvic ascites. No free intraperitoneal air. VASCULATURE: Aorta is normal in caliber. Aortic atherosclerosis. LYMPH NODES: Prominent but not pathologically sized abdominal retroperitoneal nodes are likely reactive. REPRODUCTIVE ORGANS: No acute abnormality. BONES AND SOFT TISSUES: Degenerative changes of both hips. Increased diffuse anasarca. Small bore right sided femoral venous catheter. No acute osseous abnormality. No focal soft tissue abnormality. IMPRESSION: 1. Multifactorial degradation, including lack of IV contrast and patient arm position. 2. Development of fluid overload, with new bilateral pleural effusions, increased anasarca, and ascites. 3. No evidence of bowel obstruction. 4. Repositioning or replacement of common bile duct stent with new pneumobilia indicative of stent patency. Persistent gallbladder distention with suggestion of pericholecystic edema. Cannot exclude acute cholecystitis. Consider right upper quadrant ultrasound. 5. New bibasilar airspace disease, suspicious for infection or aspiration. 6. Placement of a nasogastric tube with resolution of gastric distention. Electronically signed by: Rockey Kilts MD 03/17/2024 05:38 PM EST RP Workstation: [id number]   DG Abd 1 View Result Date: 03/16/2024 CLINICAL DATA:  Nasogastric tube placement. EXAM: DG ABDOMEN 1V COMPARISON:  03/15/2024 FINDINGS: Nasogastric tube shows a loop within the gastric fundus, with distal tip overlying the gastric body. Gaseous distension of small bowel and transverse colon noted, likely due to ileus. IMPRESSION: Nasogastric tube tip overlies the gastric body. Probable ileus. Electronically Signed   By: Norleen DELENA Kil M.D.   On: 03/16/2024 05:43   DG C-Arm 1-60 Min-No Report Result Date: 03/15/2024 Fluoroscopy was utilized by the requesting physician.  No radiographic interpretation.   DG Abd Portable 1V Result Date: 03/15/2024 EXAM: 1 VIEW XRAY OF THE ABDOMEN 03/15/2024 05:59:00 PM COMPARISON: 04/11/2023 CT  03/14/2024 CLINICAL HISTORY: [id number] Encounter for imaging study to confirm nasogastric (NG) tube placement [id number] Encounter for imaging study to confirm nasogastric (NG) tube placement FINDINGS: LINES, TUBES AND DEVICES: NG tube tip is in the mid stomach. Biliary stent noted in place. BOWEL: Gaseous distention of bowel in the right abdomen likely reflects colon. SOFT TISSUES: No opaque urinary calculi. BONES: No acute osseous abnormality. IMPRESSION: 1. NG tube tip in the mid stomach. 2. Gaseous distention of bowel in the right abdomen, likely reflecting colon. Electronically signed by: Franky Crease MD 03/15/2024 06:12 PM EDT RP Workstation: [id number]   MR ABDOMEN MRCP W WO CONTAST Result Date: 03/14/2024 CLINICAL DATA:  Known history of colon and pancreatic cancer with worsening abdominal pain, constipation EXAM: MRI ABDOMEN WITHOUT AND WITH CONTRAST (INCLUDING MRCP) TECHNIQUE: Multiplanar multisequence MR imaging of the abdomen was performed both before and after the administration of intravenous contrast. Heavily T2-weighted images of the biliary and pancreatic ducts were obtained, and three-dimensional MRCP images were  rendered by post processing. CONTRAST:  9mL GADAVIST GADOBUTROL 1 MMOL/ML IV SOLN COMPARISON:  Earlier same day CT abdomen and pelvis, MRI abdomen dated 03/04/2024 FINDINGS: Lower chest: No acute findings. Hepatobiliary: Left hepatic atrophy. No focal hepatic lesions. Similar severe intrahepatic bile duct dilation with increased abrupt caliber narrowing of the hepatic duct at the confluence with the cystic duct (14:39). Persistent distended gallbladder now demonstrates circumferential mural thickening. Pancreas: Unchanged severely dilated and tortuous main pancreatic duct upstream of an ill-defined, expansile mass in the pancreatic head and neck measuring approximately 4.1 x 2.5 cm (19:49), not substantially changed from 03/04/2024. Spleen:  Within normal limits in size and appearance. Adrenals/Urinary Tract: No adrenal nodules. No suspicious renal masses identified. No evidence of hydronephrosis. Bilateral simple cysts. Stomach/Bowel: Markedly distended stomach as seen on earlier same day CT. Vascular/Lymphatic: No pathologically enlarged lymph nodes identified. No abdominal aortic aneurysm demonstrated. Vascular involvement, as before. Other: Small volume ascites and diffuse body wall edema, increased from 03/04/2024. Musculoskeletal: No suspicious bone lesions identified. IMPRESSION: 1. Similar severe intrahepatic bile duct dilation with increased abrupt caliber narrowing of the hepatic duct at the confluence with the cystic duct. 2. Persistent distended gallbladder now demonstrates circumferential mural thickening, which may be reactive or reflect acute cholecystitis. 3. Unchanged severely dilated and tortuous main pancreatic duct upstream of an ill-defined, expansile mass in the pancreatic head and neck measuring approximately 4.1 x 2.5 cm, not substantially changed from 03/04/2024. 4. Markedly distended stomach as seen on earlier same day CT. 5. Small volume ascites and diffuse body wall edema, increased from  03/04/2024. Electronically Signed   By: Limin  Xu M.D.   On: 03/14/2024 20:38   MR 3D Recon At Scanner Result Date: 03/14/2024 CLINICAL DATA:  Known history of colon and pancreatic cancer with worsening abdominal pain, constipation EXAM: MRI ABDOMEN WITHOUT AND WITH CONTRAST (INCLUDING MRCP) TECHNIQUE: Multiplanar multisequence MR imaging of the abdomen was performed both before and after the administration of intravenous contrast. Heavily T2-weighted images of the biliary and pancreatic ducts were obtained, and three-dimensional MRCP images were rendered by post processing. CONTRAST:  9mL GADAVIST GADOBUTROL 1 MMOL/ML IV SOLN COMPARISON:  Earlier same day CT abdomen and pelvis, MRI abdomen dated 03/04/2024 FINDINGS: Lower chest: No acute findings. Hepatobiliary: Left hepatic atrophy. No focal hepatic lesions. Similar severe intrahepatic bile duct dilation with increased abrupt caliber narrowing of the hepatic duct at the confluence with the cystic duct (14:39). Persistent distended gallbladder now demonstrates circumferential mural thickening. Pancreas: Unchanged severely  dilated and tortuous main pancreatic duct upstream of an ill-defined, expansile mass in the pancreatic head and neck measuring approximately 4.1 x 2.5 cm (19:49), not substantially changed from 03/04/2024. Spleen:  Within normal limits in size and appearance. Adrenals/Urinary Tract: No adrenal nodules. No suspicious renal masses identified. No evidence of hydronephrosis. Bilateral simple cysts. Stomach/Bowel: Markedly distended stomach as seen on earlier same day CT. Vascular/Lymphatic: No pathologically enlarged lymph nodes identified. No abdominal aortic aneurysm demonstrated. Vascular involvement, as before. Other: Small volume ascites and diffuse body wall edema, increased from 03/04/2024. Musculoskeletal: No suspicious bone lesions identified. IMPRESSION: 1. Similar severe intrahepatic bile duct dilation with increased abrupt caliber  narrowing of the hepatic duct at the confluence with the cystic duct. 2. Persistent distended gallbladder now demonstrates circumferential mural thickening, which may be reactive or reflect acute cholecystitis. 3. Unchanged severely dilated and tortuous main pancreatic duct upstream of an ill-defined, expansile mass in the pancreatic head and neck measuring approximately 4.1 x 2.5 cm, not substantially changed from 03/04/2024. 4. Markedly distended stomach as seen on earlier same day CT. 5. Small volume ascites and diffuse body wall edema, increased from 03/04/2024. Electronically Signed   By: Limin  Xu M.D.   On: 03/14/2024 20:38   CT ABDOMEN PELVIS W CONTRAST Result Date: 03/14/2024 CLINICAL DATA:  Constipation, short of breath, bilateral lower extremity edema, history of colon cancer, recent stent placement for obstructive jaundice EXAM: CT ABDOMEN AND PELVIS WITH CONTRAST TECHNIQUE: Multidetector CT imaging of the abdomen and pelvis was performed using the standard protocol following bolus administration of intravenous contrast. RADIATION DOSE REDUCTION: This exam was performed according to the departmental dose-optimization program which includes automated exposure control, adjustment of the mA and/or kV according to patient size and/or use of iterative reconstruction technique. CONTRAST:  OMNIPAQUE  IOHEXOL  300 MG/ML  SOLN COMPARISON:  03/04/2024, 02/27/2024 FINDINGS: Lower chest: No acute pleural or parenchymal lung disease. Stable cystic changes in the right lower lobe with bibasilar bronchiectasis again noted. Hepatobiliary: There is progressive intrahepatic and extrahepatic biliary duct dilation despite interval placement of a biliary stent. The proximal aspect of the stent is within the right common hepatic duct, and distal aspect within the downstream common bile duct. The tip does not appear to extend into the lumen of the duodenum, and may not provide adequate decompression of the biliary  tree. Marked distension of the gallbladder with no evidence of acute cholecystitis. No acute parenchymal liver abnormality. Pancreas: Complex cystic and solid mass at the junction of the head and body of the pancreas again noted, measuring approximately 3.1 x 2.7 cm, previously evaluated by MRI. There is upstream pancreatic duct dilation and atrophy of the parenchyma within the distal body and tail the pancreas, stable. No acute inflammatory changes. Spleen: Normal in size without focal abnormality. Adrenals/Urinary Tract: The adrenals are stable. Bilateral renal cortical and peripelvic cysts do not require specific imaging follow-up. The kidneys enhance normally. Bladder is unremarkable. Stomach/Bowel: No bowel obstruction or ileus. Normal appendix right lower quadrant. Postsurgical changes from previous partial colon resection. No bowel wall thickening or acute inflammatory changes. Distended fluid-filled stomach is noted, consideration could be given to decompression with enteric catheter. Vascular/Lymphatic: Aortic atherosclerosis. 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 No enlarged abdominal or pelvic lymph nodes. Reproductive: Stable enlargement of the prostate. Other: Trace ascites most pronounced in the right upper quadrant and left paracolic gutter.  No free intraperitoneal gas. No abdominal wall hernia. Musculoskeletal: No acute or destructive bony abnormalities. Reconstructed images demonstrate no additional findings. IMPRESSION: 1. Progressive intrahepatic and extrahepatic biliary duct dilation despite interval placement of bile duct stent. The downstream aspect of the biliary stent projects near the ampulla not clearly within the lumen of the duodenum, and may not provide adequate decompression of the biliary tree. 2. Marked gallbladder distension without evidence of cholelithiasis or acute  cholecystitis. 3. Stable complex cystic and solid mass within the pancreatic body, with continued extrinsic compression upon the portal vein, upstream pancreatic duct dilation, and atrophy of the pancreatic body and tail. This has been previously evaluated by MRI. 4. Distended fluid-filled stomach, which may require decompression with enteric catheter. No evidence of bowel obstruction or ileus. 5. Trace ascites. 6.  Aortic Atherosclerosis (ICD10-I70.0). Electronically Signed   By: Ozell Daring M.D.   On: 03/14/2024 16:04   DG Chest Portable 1 View Result Date: 03/14/2024 EXAM: 1 VIEW(S) XRAY OF THE CHEST 03/14/2024 01:09:00 PM COMPARISON: None available. CLINICAL HISTORY: sob FINDINGS: LINES, TUBES AND DEVICES: Right IJ chest port in place with tip terminating in right atrium. LUNGS AND PLEURA: Low lung volumes. Bibasilar airspace opacities. No pulmonary edema. No pleural effusion. No pneumothorax. HEART AND MEDIASTINUM: Atherosclerotic calcifications. No acute abnormality of the cardiac and mediastinal silhouettes. BONES AND SOFT TISSUES: Degenerative changes of the shoulders. No acute osseous abnormality. IMPRESSION: 1. Low lung volumes with bibasilar airspace opacities, which may reflect atelectasis or pneumonia. Electronically signed by: Norleen Boxer MD 03/14/2024 01:48 PM EDT RP Workstation: [id number]   DG C-Arm 1-60 Min-No Report Result Date: 03/04/2024 Fluoroscopy was utilized by the requesting physician.  No radiographic interpretation.   MR ABDOMEN MRCP W WO CONTAST Result Date: 03/04/2024 EXAM: MRCP WITH AND WITHOUT IV CONTRAST 03/04/2024 01:05:40 AM TECHNIQUE: Multisequence, multiplanar magnetic resonance images of the abdomen with and without intravenous contrast. MRCP sequences were performed. 8 mL gadobutrol (GADAVIST) 1 MMOL/ML injection. COMPARISON: CT 02/27/2024 exam detail is diminished by motion artifact. CLINICAL HISTORY: Jaundice. Patient sent to ED by Dr Babara due to pt having a  distended gallbladder and is jaundice. Currently has colon cancer. FINDINGS: LIVER: As noted previously, there is atrophy involving the left lobe of liver. GALLBLADDER AND BILIARY SYSTEM: Moderate gallbladder distention. No significant gallbladder wall thickening. No gallstones identified. Marked intrahepatic bile duct dilatation. The proximal common bile duct measures up to 1.7 cm in diameter, image 20/3. No choledocholithiasis. Abrupt termination of the dilated common bile duct is noted at the level of the head of the pancreas. SPLEEN: Unremarkable. PANCREAS/PANCREATIC DUCT: There is main duct dilatation of the pancreas from the body through the tail. Obscured by motion artifact, a mass is suspected between the head and body of pancreas which obstructs the common bile duct and the main pancreatic duct as suggested on axial image 16/26 and coronal image 20/3. This measures approximately 4.0 x 2.6 cm, image 36/80. ADRENAL GLANDS: Normal size and morphology bilaterally. No nodule, thickening, or hemorrhage. No periadrenal stranding. KIDNEYS: Bilateral kidney cysts. No follow-up imaging recommended. The largest is in the lower pole of the right kidney measuring 2.8 cm, image 48/25. LYMPH NODES: No upper abdominal adenopathy. VASCULATURE: There appears to be encasement of the superior mesenteric artery. There is also encasement of the celiac artery. 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 Aortic atherosclerotic calcification. PERITONEUM: Trace ascites. A small volume of free fluid extends over the liver. ABDOMINAL WALL: No hernia.  No mass. BOWEL: There is gaseous distention of the colon up to the level of the rectum. No bowel obstruction. BONES: No acute abnormality or worrisome osseous lesion. SOFT TISSUES: Unremarkable. MISCELLANEOUS: Unremarkable. IMPRESSION: 1. Pancreatic mass between the head and body measuring approximately 4.0 x 2.6 cm causing obstruction of the  common bile duct and main pancreatic duct; evaluation limited by motion artifact. 2. Vascular involvement with encasement of the superior mesenteric artery and celiac artery. 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 Electronically signed by: Waddell Calk MD 03/04/2024 06:02 AM EDT RP Workstation: [id number]   MR 3D Recon At Scanner Result Date: 03/04/2024 EXAM: MRCP WITH AND WITHOUT IV CONTRAST 03/04/2024 01:05:40 AM TECHNIQUE: Multisequence, multiplanar magnetic resonance images of the abdomen with and without intravenous contrast. MRCP sequences were performed. 8 mL gadobutrol (GADAVIST) 1 MMOL/ML injection. COMPARISON: CT 02/27/2024 exam detail is diminished by motion artifact. CLINICAL HISTORY: Jaundice. Patient sent to ED by Dr Babara due to pt having a distended gallbladder and is jaundice. Currently has colon cancer. FINDINGS: LIVER: As noted previously, there is atrophy involving the left lobe of liver. GALLBLADDER AND BILIARY SYSTEM: Moderate gallbladder distention. No significant gallbladder wall thickening. No gallstones identified. Marked intrahepatic bile duct dilatation. The proximal common bile duct measures up to 1.7 cm in diameter, image 20/3. No choledocholithiasis. Abrupt termination of the dilated common bile duct is noted at the level of the head of the pancreas. SPLEEN: Unremarkable. PANCREAS/PANCREATIC DUCT: There is main duct dilatation of the pancreas from the body through the tail. Obscured by motion artifact, a mass is suspected between the head and body of pancreas which obstructs the common bile duct and the main pancreatic duct as suggested on axial image 16/26 and coronal image 20/3. This measures approximately 4.0 x 2.6 cm, image 36/80. ADRENAL GLANDS: Normal size and morphology bilaterally. No nodule, thickening, or hemorrhage. No periadrenal stranding. KIDNEYS: Bilateral kidney cysts. No follow-up imaging recommended. The largest is in  the lower pole of the right kidney measuring 2.8 cm, image 48/25. LYMPH NODES: No upper abdominal adenopathy. VASCULATURE: There appears to be encasement of the superior mesenteric artery. There is also encasement of the celiac artery. 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 Aortic atherosclerotic calcification. PERITONEUM: Trace ascites. A small volume of free fluid extends over the liver. ABDOMINAL WALL: No hernia. No mass. BOWEL: There is gaseous distention of the colon up to the level of the rectum. No bowel obstruction. BONES: No acute abnormality or worrisome osseous lesion. SOFT TISSUES: Unremarkable. MISCELLANEOUS: Unremarkable. IMPRESSION: 1. Pancreatic mass between the head and body measuring approximately 4.0 x 2.6 cm causing obstruction of the common bile duct and main pancreatic duct; evaluation limited by motion artifact. 2. Vascular involvement with encasement of the superior mesenteric artery and celiac artery. 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 Electronically signed by: Waddell Calk MD 03/04/2024 06:02 AM EDT RP Workstation: [id number]   CT CHEST ABDOMEN PELVIS W CONTRAST Result Date: 02/27/2024 CLINICAL DATA:  colon cancer. * Tracking Code: BO * EXAM: CT CHEST, ABDOMEN, AND PELVIS WITH CONTRAST TECHNIQUE: Multidetector CT imaging of the chest, abdomen and pelvis was performed following the standard protocol during bolus administration of intravenous contrast. RADIATION DOSE REDUCTION: This exam was performed according to the departmental dose-optimization program which includes automated exposure control, adjustment of the mA and/or kV according to patient size and/or use of iterative reconstruction technique. CONTRAST:  100mL OMNIPAQUE  IOHEXOL   300 MG/ML  SOLN COMPARISON:  CT scan abdomen and pelvis from 12/04/2023 and CT scan chest, abdomen and pelvis from 10/30/2023. FINDINGS: CT CHEST FINDINGS  Cardiovascular: Normal cardiac size. No pericardial effusion. No aortic aneurysm. There are coronary artery calcifications, in keeping with coronary artery disease. There are also mild peripheral atherosclerotic vascular calcifications of thoracic aorta and its major branches. Aberrant origin of right subclavian artery noted, which arises distal to the left subclavian artery and courses towards the right side, posterior to the esophagus. Mediastinum/Nodes: Visualized thyroid  gland appears grossly unremarkable. No solid / cystic mediastinal masses. The esophagus is nondistended precluding optimal assessment. No axillary, mediastinal or hilar lymphadenopathy by size criteria. Lungs/Pleura: The central tracheo-bronchial tree is patent. Redemonstration of combination of emphysematous changes as well as bronchiectatic changes mainly at bilateral lung bases, right more than left. No mass or consolidation. No pleural effusion or pneumothorax. There is a new, 3 x 5 mm solid noncalcified nodule in the left upper lobe (series 3, image 49). There is a stable previously seen sub 4 mm noncalcified nodule in the right upper lobe (series 3, image 56). Musculoskeletal: A CT Port-a-Cath is seen in the right upper chest wall with the catheter terminating in the right atrium. Visualized soft tissues of the chest wall are otherwise grossly unremarkable. No suspicious osseous lesions. There are mild multilevel degenerative changes in the visualized spine. CT ABDOMEN PELVIS FINDINGS Hepatobiliary: The liver is normal in size. Non-cirrhotic configuration. No suspicious mass. Since the prior study, there is new moderate intrahepatic bile duct dilation as well as moderate-to-severe extrahepatic bile duct dilation. Gallbladder is distended measuring up to 5.7 cm in width. No abnormal wall thickening or pericholecystic fat stranding. No calcified gallstones. Pancreas: Redemonstration of heterogeneous appearance of the pancreas. There is a  relatively well-circumscribed 2.1 x 2.2 cm hypoattenuating lesion in the proximal body, which appears grossly similar to the prior study. Upstream to it, there is atrophy of pancreas with prominent main pancreatic duct however, grossly similar to the prior study. There is diffuse mild atrophy of the pancreatic head/uncinate process as well. There is a single dystrophic calcification in the pancreatic body, nonspecific but commonly seen as a sequela of chronic pancreatitis. However, no acute peripancreatic fat stranding noted. Spleen: Within normal limits. No focal lesion. Adrenals/Urinary Tract: Adrenal glands are unremarkable. No suspicious renal mass. There are at least 2 simple cysts in the right kidney lower pole with larger cyst measuring up to 2.9 x 4.2 cm. There is a sinus cyst in the left kidney interpolar region. No nephroureterolithiasis or obstructive uropathy. Urinary bladder is under distended, precluding optimal assessment. However, no large mass or stones identified. No perivesical fat stranding. Stomach/Bowel: No disproportionate dilation of the small or large bowel loops. There is apparent mild irregular wall thickening of the left paramedian transverse colon (series 2, image 58), which is likely accentuated by underdistention. Findings are nonspecific but can be seen with primary or secondary colitis or other etiologies including subserosal metastatic deposits or primary malignancy. Correlate clinically. This area is new since the prior study from 12/04/2023. No other evidence of abnormal bowel wall thickening or inflammatory changes. The appendix was not visualized; however there is no acute inflammatory process in the right lower quadrant. Vascular/Lymphatic: No ascites or pneumoperitoneum. No abdominal or pelvic lymphadenopathy, by size criteria. No aneurysmal dilation of the major abdominal arteries. Reproductive: Enlarged prostate. Symmetric seminal vesicles. Other: Midline surgical scar  noted. There is a small fat containing umbilical hernia. The soft  tissues and abdominal wall are otherwise unremarkable. Musculoskeletal: No suspicious osseous lesions. There are mild multilevel degenerative changes in the visualized spine. IMPRESSION: 1. There is a new 3 x 5 mm solid noncalcified nodule in the left upper lobe. Otherwise, no metastatic disease identified within the chest. 2. There is apparent mild irregular wall thickening of the left paramedian transverse colon, which is likely accentuated by underdistention. Findings are nonspecific but can be seen with primary or secondary colitis or other etiologies including neoplastic process. 3. There is new moderate intrahepatic bile duct dilation as well as moderate-to-severe extrahepatic bile duct dilation. Gallbladder is distended measuring up to 5.7 cm in width. No abnormal wall thickening or pericholecystic fat stranding. No calcified gallstones. Correlate clinically and with liver function tests to determine the need for further evaluation with MRI/MRCP. 4. Redemonstration of heterogeneous hypoattenuating mass centered in the proximal pancreatic body. 5. Multiple other nonacute observations, as described above. Electronically Signed   By: Ree Molt M.D.   On: 02/27/2024 15:02    Labs:  CBC: Recent Labs    03/15/24 1534 03/16/24 0221 03/17/24 0410 03/17/24 1610 03/18/24 0406  WBC 25.8* 23.4* 13.9*  --  20.4*  HGB 10.3* 8.1* 6.8* 8.8* 9.7*  HCT 29.5* 23.5* 19.4* 25.1* 27.1*  PLT 185 143* 110*  --  138*    COAGS: Recent Labs    12/04/23 2136 03/03/24 1807 03/16/24 0654 03/16/24 1609 03/17/24 0410 03/18/24 0406  INR 1.2   < > 3.6* 3.0* 2.5* 1.8*  APTT 30  --   --   --   --   --    < > = values in this interval not displayed.    BMP: Recent Labs    03/16/24 0221 03/16/24 1609 03/17/24 0410 03/17/24 1723 03/17/24 2253 03/18/24 0406  NA 137 133* 133*  --   --  135  K 2.6* 3.3* 3.3* 3.5 3.7 3.5  CL 109 106 109   --   --  110  CO2 14* 15* 16*  --   --  15*  GLUCOSE 115* 148* 154*  --   --  91  BUN 16 20 21   --   --  31*  CALCIUM  7.8* 8.0* 8.1*  --   --  8.9  CREATININE 1.31* 1.36* 1.32*  --   --  1.88*  GFRNONAA 54* 52* 54*  --   --  35*    LIVER FUNCTION TESTS: Recent Labs    03/15/24 2306 03/16/24 0407 03/17/24 0410 03/18/24 0406  BILITOT 16.2* 15.4* 14.0* 18.3*  AST 249* 225* 109* 113*  ALT 89* 86* 59* 63*  ALKPHOS 183* 162* 110 166*  PROT 4.9* 4.6* 4.4* 5.2*  ALBUMIN 1.9* 1.7* 2.0* 2.3*    TUMOR MARKERS: No results for input(s): AFPTM, CEA, CA199, CHROMGRNA in the last 8760 hours.  Assessment and Plan: Patient developed obstructive jaundice and was diagnosed with metastatic pancreatic mass. He presented to ED on 10/31 with complaints of SOB, and labs were concerning for progressive leukocytosis and worsening hyperbilirubinemia. He was admitted and underwent repeat ERCP on 11/1 with stent exchange, complicated by a brief episode of PEA upon intubation. Most recent labs show persistent leukocytosis and bilirubin again increasing. Interventional Radiology was therefore requested for biliary drain placement. Request was reviewed and approved by Dr. Jenna, who does note placement may be notably difficult.   Patient will tentatively present for cholangiogram with possible biliary drain placement in IR tomorrow.  Patient is NPO, NGT in  place. All labs and medications are within acceptable parameters.  No pertinent allergies.   Risks and benefits of biliary drain placement  discussed with the patient including, but not limited to bleeding, infection which may lead to sepsis or even death and damage to adjacent structures.  This interventional procedure involves the use of X-rays and because of the nature of the planned procedure, it is possible that we will have prolonged use of X-ray fluoroscopy.  Potential radiation risks to you include (but are not limited to) the  following: - A slightly elevated risk for cancer several years later in life. This risk is typically less than 0.5% percent. This risk is low in comparison to the normal incidence of human cancer, which is 33% for women and 50% for men according to the [employer]. - Radiation induced injury can include skin redness, resembling a rash, tissue breakdown / ulcers and hair loss (which can be temporary or permanent).   The likelihood of either of these occurring depends on the difficulty of the procedure and whether you are sensitive to radiation due to previous procedures, disease, or genetic conditions.   IF your procedure requires a prolonged use of radiation, you will be notified and given written instructions for further action.  It is your responsibility to monitor the irradiated area for the 2 weeks following the procedure and to notify your physician if you are concerned that you have suffered a radiation induced injury.    All questions were answered, patient' wife and daughter are agreeable to proceed.  Consent signed and in chart.    Thank you for allowing our service to participate in Jared Gutierrez 's care.  Electronically Signed: Carlin DELENA Griffon, PA-C   03/18/2024, 3:40 PM      I spent a total of 40 Minutes  in face to face in clinical consultation, greater than 50% of which was counseling/coordinating care for biliary drain placement.

## 2024-03-18 NOTE — Progress Notes (Signed)
 Dr. Jinny notified of critical bili 18.3.

## 2024-03-18 NOTE — Progress Notes (Signed)
  Chaplain On-Call responded to a referral from Overnight Chaplain to offer continuing support for the patient and his family.  Chaplain met the patient, with his wife and daughter at the bedside.  Chaplain provided spiritual and emotional support, and prayer at family's request.  Chaplain Bebe Ardean EMERSON Hershal., [facility]

## 2024-03-18 NOTE — Consult Note (Signed)
 [facility] SURGICAL CONSULTATION NOTE (initial) - cpt: 99254   HISTORY OF PRESENT ILLNESS (HPI):  83 y.o. Gutierrez with history of metastatic transverse colon CA (adenocarcinoma) s/p radiation (2024) and chemotherapy, currently on chemotherapy holiday (last received 10/2023). 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 Last seen by oncology on 10/29. He presents to the ED on 10/31 secondary to SOB. In the ED, patient was found to have progressive leukocytosis and continued worsening of his hyperbilirubinemia. He was admitted to the medicine service and gastroenterology was consulted. It seems there was discussion regarding transfer to [facility] however this was refused given worsening INR in setting of sepsis. He underwent ERCP on 11/01 with stent exchange with Dr Jinny. Of note, does appear patient had period of PEA after intubation which quickly resolved. Most recent labs show continued leukocytosis with WBC to 20.4K, Hgb to 9.7, PLT 138K, sCr - 1.88, INR improving to 1.8 (from 4.5 at peak), bilirubin again increasing 18.3. No additional imaging today. IR has been consulted as well today.   This afternoon, patient is very lethargic. Opens eyes only to verbal stimuli but then falls asleep quickly. Family present at bedside and seem very understanding. He does have NGT in place. He has BM recorded in last 12 hours.   Surgery is consulted by PCCM physician Dr. Faud Aleskerov, MD in this context for evaluation and management of possible cholecystitis.  PAST MEDICAL HISTORY (PMH):  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   Chicken pox    Colon cancer (HCC)    History of kidney stones    Hypertension    Lipoma of colon    Nephrolithiasis    Nephrolithiasis    Obesity    Shingles    Tubular adenoma of colon     multiple fragments     PAST SURGICAL HISTORY (PSH):  Past Surgical History:  Procedure Laterality Date   BILIARY STENT PLACEMENT  03/04/2024   Procedure: INSERTION, STENT, BILE DUCT;  Surgeon: Jinny Carmine, MD;  Location: [facility] ENDOSCOPY;  Service: Endoscopy;;   BILIARY STENT PLACEMENT  03/15/2024   Procedure: INSERTION, STENT, BILE DUCT;  Surgeon: Jinny Carmine, MD;  Location: [facility] ENDOSCOPY;  Service: Endoscopy;;   COLON SURGERY     COLONOSCOPY N/A 10/02/2014   Procedure: COLONOSCOPY;  Surgeon: Donnice Vaughn Manes, MD;  Location: [facility] ENDOSCOPY;  Service: Endoscopy;  Laterality: N/A;   COLONOSCOPY WITH PROPOFOL  N/A 02/16/2017   Procedure: COLONOSCOPY WITH PROPOFOL ;  Surgeon: Viktoria Lamar DASEN, MD;  Location: [facility] ENDOSCOPY;  Service: Endoscopy;  Laterality: N/A;   COLONOSCOPY WITH PROPOFOL  N/A 10/05/2020   Procedure: COLONOSCOPY WITH PROPOFOL ;  Surgeon: Maryruth Ole DASEN, MD;  Location: [facility] ENDOSCOPY;  Service: Endoscopy;  Laterality: N/A;   ERCP N/A 03/04/2024   Procedure: ERCP, WITH INTERVENTION IF INDICATED;  Surgeon: Jinny Carmine, MD;  Location: [facility] ENDOSCOPY;  Service: Endoscopy;  Laterality: N/A;   ERCP N/A 03/15/2024   Procedure: ERCP, WITH INTERVENTION IF INDICATED;  Surgeon: Jinny Carmine, MD;  Location: [facility] ENDOSCOPY;  Service: Endoscopy;  Laterality: N/A;   ESOPHAGOGASTRODUODENOSCOPY N/A 12/31/2023   Procedure: EGD (ESOPHAGOGASTRODUODENOSCOPY);  Surgeon: Wilhelmenia Aloha Raddle., MD;  Location: [facility];  Service: Gastroenterology;  Laterality: N/A;   EUS N/A 04/17/2019   Procedure: FULL UPPER ENDOSCOPIC ULTRASOUND (EUS) RADIAL;  Surgeon:  Queenie Asberry LABOR, MD;  Location: [facility] ENDOSCOPY;  Service: Gastroenterology;  Laterality: N/A;   EUS N/A 12/31/2023   Procedure: ULTRASOUND, UPPER GI TRACT, ENDOSCOPIC;  Surgeon: Wilhelmenia Aloha Raddle., MD;  Location: [facility] ENDOSCOPY;  Service: Gastroenterology;  Laterality: N/A;   FINE NEEDLE ASPIRATION  12/31/2023   Procedure: FINE NEEDLE  ASPIRATION;  Surgeon: Wilhelmenia Aloha Raddle., MD;  Location: [facility] ENDOSCOPY;  Service: Gastroenterology;;   KIDNEY STONE SURGERY     PARTIAL COLECTOMY  10/17/2013   PORTACATH PLACEMENT Right 06/13/2019   Procedure: INSERTION PORT-A-CATH;  Surgeon: Tye Millet, DO;  Location: [facility] ORS;  Service: General;  Laterality: Right;   STENT REMOVAL  03/15/2024   Procedure: STENT REMOVAL;  Surgeon: Jinny Carmine, MD;  Location: [facility] ENDOSCOPY;  Service: Endoscopy;;     MEDICATIONS:  Prior to Admission medications   Medication Sig Start Date End Date Taking? Authorizing Provider  cholestyramine (QUESTRAN) 4 g packet Take 1 packet (4 g total) by mouth 3 (three) times daily for 14 days. 03/06/24 03/20/24 Yes Agbata, Tochukwu, MD  cyanocobalamin (VITAMIN B12) 500 MCG tablet Take by mouth. 07/26/23 07/25/24 Yes [provider]  feeding supplement (ENSURE PLUS HIGH PROTEIN) LIQD Take 237 mLs by mouth 2 (two) times daily between meals. 03/06/24 04/05/24 Yes Agbata, Tochukwu, MD  furosemide  (LASIX ) 20 MG tablet Take 1 tablet (20 mg total) by mouth daily as needed for edema. 03/12/24  Yes Babara Call, MD  omeprazole  (PRILOSEC) 20 MG capsule Take 20 mg by mouth daily. 12/25/23  Yes [provider]  ondansetron  (ZOFRAN ) 8 MG tablet Take 1 tablet (8 mg total) by mouth 2 (two) times daily as needed for refractory nausea / vomiting. Start on day 3 after chemotherapy. 11/16/20  Yes Babara Call, MD  pantoprazole  (PROTONIX ) 40 MG tablet Take 1 tablet (40 mg total) by mouth every morning. Take in the morning on an empty stomach 12/07/23 03/15/24 Yes Sreenath, Sudheer B, MD  potassium chloride  SA (KLOR-CON  M) 20 MEQ tablet Take 1 tablet (20 mEq total) by mouth daily. 03/12/24  Yes Babara Call, MD  Probiotic Product (PROBIOTIC DAILY PO) Take 1 tablet by mouth daily.   Yes [provider]  tamsulosin  (FLOMAX ) 0.4 MG CAPS capsule Take 0.4 mg by mouth daily after supper. 03/03/24  Yes [provider]  loratadine  (CLARITIN) 10 MG tablet Take 10 mg by mouth daily. Patient not taking: Reported on 03/15/2024    [provider]     ALLERGIES:  No Known Allergies   SOCIAL HISTORY:  Social History   Socioeconomic History   Marital status: Married    Spouse name: Not on file   Number of children: Not on file   Years of education: Not on file   Highest education level: Not on file  Occupational History   Not on file  Tobacco Use   Smoking status: Never   Smokeless tobacco: Never  Vaping Use   Vaping status: Never Used  Substance and Sexual Activity   Alcohol use: Not Currently    Comment: socially    Drug use: No   Sexual activity: Not on file  Other Topics Concern   Not on file  Social History Narrative   Not on file   Social Drivers of Health   Financial Resource Strain: Not on file  Food Insecurity: Patient Unable To Answer (03/16/2024)   Hunger Vital Sign    Worried About Running Out of Food in the Last Year: Patient unable to answer  Ran Out of Food in the Last Year: Patient unable to answer  Transportation Needs: Patient Unable To Answer (03/16/2024)   PRAPARE - Transportation    Lack of Transportation (Medical): Patient unable to answer    Lack of Transportation (Non-Medical): Patient unable to answer  Physical Activity: Not on file  Stress: Not on file  Social Connections: Patient Unable To Answer (03/16/2024)   Social Connection and Isolation Panel    Frequency of Communication with Friends and Family: Patient unable to answer    Frequency of Social Gatherings with Friends and Family: Patient unable to answer    Attends Religious Services: Patient unable to answer    Active Member of Clubs or Organizations: Patient unable to answer    Attends Banker Meetings: Patient unable to answer    Marital Status: Patient unable to answer  Intimate Partner Violence: Patient Unable To Answer (03/16/2024)   Humiliation, Afraid, Rape, and Kick questionnaire     Fear of Current or Ex-Partner: Patient unable to answer    Emotionally Abused: Patient unable to answer    Physically Abused: Patient unable to answer    Sexually Abused: Patient unable to answer     FAMILY HISTORY:  Family History  Problem Relation Age of Onset   Cancer Mother    Breast cancer Mother    COPD Father       REVIEW OF SYSTEMS:  Review of Systems  Unable to perform ROS: Mental acuity    VITAL SIGNS:  Temp:  [96.4 F (35.8 C)-97.9 F (36.6 C)] 97.9 F (36.6 C) (11/04 1200) Pulse Rate:  [35-101] 98 (11/04 1200) Resp:  [11-29] 19 (11/04 1200) BP: (103-133)/(61-81) 126/75 (11/04 1200) SpO2:  [93 %-97 %] 96 % (11/04 1200) Arterial Line BP: (126-198)/(53-96) 163/86 (11/04 1200)     Height: 5' 8 (172.7 cm) Weight: 89.2 kg BMI (Calculated): 29.91   INTAKE/OUTPUT:  11/03 0701 - 11/04 0700 In: 706 [Blood:386; NG/GT:120; IV Piggyback:200] Out: 535 [Urine:135; Emesis/NG output:400]  PHYSICAL EXAM:  Physical Exam Vitals and nursing note reviewed.  Constitutional:      General: He is not in acute distress.    Comments: Patient resting in bed; he briefly opens his eyes to verbal stimuli but is otherwise somnolent. Family present to bedside   HENT:     Head: Normocephalic and atraumatic.     Comments: NGT in place Eyes:     General: Scleral icterus present.  Cardiovascular:     Rate and Rhythm: Normal rate. Rhythm irregular.  Pulmonary:     Effort: Pulmonary effort is normal.     Breath sounds: Normal breath sounds.     Comments: On [place] Abdominal:     General: Abdomen is flat. There is no distension.     Palpations: Abdomen is soft.     Comments: Abdomen is soft, he does not appear overtly tender but again this is unreliable given his mental acuity, non-distended, no rebound/guarding   Genitourinary:    Comments: Deferred Neurological:     Comments: Unable to reliable assess secondary to mental acuity   Psychiatric:     Comments: Unable to reliable assess  secondary to mental acuity       Labs:     Latest Ref Rng & Units 03/18/2024    4:06 AM 03/17/2024    4:10 PM 03/17/2024    4:10 AM  CBC  WBC 4.0 - 10.5 K/uL 20.4   13.9   Hemoglobin 13.0 - 17.0 g/dL 9.7  8.8  6.8   Hematocrit 39.0 - 52.0 % 27.1  25.1  19.4   Platelets 150 - 400 K/uL 138   110       Latest Ref Rng & Units 03/18/2024    4:06 AM 03/17/2024   10:53 PM 03/17/2024    5:23 PM  CMP  Glucose 70 - 99 mg/dL 91     BUN 8 - 23 mg/dL 31     Creatinine 9.Jared - 1.24 mg/dL 8.11     Sodium 864 - 854 mmol/L 135     Potassium 3.5 - 5.1 mmol/L 3.5  3.7  3.5   Chloride 98 - 111 mmol/L 110     CO2 22 - 32 mmol/L 15     Calcium  8.9 - 10.3 mg/dL 8.9     Total Protein 6.5 - 8.1 g/dL 5.2     Total Bilirubin 0.0 - 1.2 mg/dL 81.6     Alkaline Phos Jared - 126 U/L 166     AST 15 - 41 U/L 113     ALT 0 - 44 U/L 63        Imaging studies:   RUQ US  (03/17/2024) personally reviewed with noted gallbladder changes, pneumobilia, and radiologist report reviewed below:  IMPRESSION: 1. Similar fluid-filled dilation of the gallbladder. Circumferential wall thickening and gallbladder wall edema, which in the absence of a sonographic Murphy's sign and gallstones, may be related to upper abdominal inflammation, acute hepatitis, or volume overload from either CHF or renal failure. 2. Extensive pneumobilia again noted. 3. Small volume perihepatic ascites.  Right pleural effusion.   CT Abdomen/Pelvis (03/17/2024) personally reviewed with changes consistent with known malignancy, and radiologist report reviewed below:  IMPRESSION: 1. Multifactorial degradation, including lack of IV contrast and patient arm position. 2. Development of fluid overload, with new bilateral pleural effusions, increased anasarca, and ascites. 3. No evidence of bowel obstruction. 4. Repositioning or replacement of common bile duct stent with new pneumobilia indicative of stent patency. Persistent gallbladder distention  with suggestion of pericholecystic edema. Cannot exclude acute cholecystitis. Consider right upper quadrant ultrasound. 5. New bibasilar airspace disease, suspicious for infection or aspiration. 6. Placement of a nasogastric tube with resolution of gastric distention.   MRCP (03/14/2024) personally reviewed with known changes of malignancy, and radiologist report reviewed below: IMPRESSION: 1. Similar severe intrahepatic bile duct dilation with increased abrupt caliber narrowing of the hepatic duct at the confluence with the cystic duct. 2. Persistent distended gallbladder now demonstrates circumferential mural thickening, which may be reactive or reflect acute cholecystitis. 3. Unchanged severely dilated and tortuous main pancreatic duct upstream of an ill-defined, expansile mass in the pancreatic head and neck measuring approximately 4.1 x 2.5 cm, not substantially changed from 03/04/2024. 4. Markedly distended stomach as seen on earlier same day CT. 5. Small volume ascites and diffuse body wall edema, increased from 03/04/2024.    Assessment/Plan: 83 y.o. Gutierrez with progressive metastatic colon CA now with pancreatic mass and biliary obstruction s/p ERCP (10/21 & 11/01) with continued hyperbilirubinemia and leukocytosis   - Low suspicion for cholecystitis at this time. I do think noted changes of his gallbladder are sequela of his obstructive/malignant process. Unfortunately, it seems that ERCP has been unsuccessful in completely relieving biliary obstruction. It seems IR has been consulted and plan for evaluation; ? PTC. He is a very poor surgical candidate given his malignant process and recent systemic decline. At the most, could consider percutaneous cholecystotomy tube for gallbladder decompression.   - I  do think we should involve palliative care to discuss goals of care and how aggressive family wishes to be. This is an unfortunate case.   - NGT decompression is reasonable given  noted suction of 1.9L from stomach during ERCP on 11/01  - Continue aggressive IV Abx   - Monitor leukocytosis, hyperbilirubinemia, renal function    - Further management per primary service  All of the above findings and recommendations were discussed with the patient's family, and all of patient's family's questions were answered to their expressed satisfaction.  Thank you for the opportunity to participate in this patient's care.   Face-to-face time spent with the patient and care providers was 60 minutes, with more than 50% of the time spent counseling, educating, and coordinating care of the patient.     -- Arthea Platt, PA-C [facility] 03/18/2024, 12:59 PM M-F: 7am - 4pm

## 2024-03-18 NOTE — Progress Notes (Signed)
 PHARMACY CONSULT NOTE - FOLLOW UP  Pharmacy Consult for Electrolyte Monitoring and Replacement   Recent Labs: Potassium (mmol/L)  Date Value  03/18/2024 3.5  04/27/2014 3.7   Magnesium  (mg/dL)  Date Value  88/95/7974 2.2  05/22/2011 2.2   Calcium  (mg/dL)  Date Value  88/95/7974 8.9   Calcium , Total (mg/dL)  Date Value  87/85/7984 9.4   Albumin (g/dL)  Date Value  88/95/7974 2.3 (L)  04/27/2014 3.3 (L)   Phosphorus (mg/dL)  Date Value  88/95/7974 3.6   Sodium (mmol/L)  Date Value  03/18/2024 135  04/27/2014 142    Assessment: 83 y.o. male with medical history significant for essential hypertension, urolithiasis recurrent colon and pancreatic cancer in chemotherapy holiday from June of this year, with recent biliary obstruction status post biliary stent placement on 03/04/2024 by Dr. Jinny, who presented to the emergency room with acute onset of worsening jaundice and right upper quadrant abdominal pain. Pharmacy is asked to follow and replace electrolytes  Renal function not at baseline (~0.7). Concerns for AKI have not resolved.  Diet: npo - NG/OG tube  Medications: lasix  40mg  IV daily  Goal of Therapy:  Electrolytes WNL  Plan:  --- K 3.5 > 20 mEq Kcl per tube, considering daily lasix  ---recheck electrolytes in am  Leonor JAYSON Argyle ,PharmD 03/18/2024 12:48 PM

## 2024-03-18 NOTE — TOC Initial Note (Addendum)
 Transition of Care [facility]) - Initial/Assessment Note    Patient Details  Name: Jared Gutierrez MRN: [id number] Date of Birth: Feb 22, 1941  [facility]) CM/SW Contact:    Corrie JINNY Ruts, LCSW Phone Number: 03/18/2024, 2:16 PM  Clinical Narrative:                 Chart reviewed. The patient was admitted for Obstructive Jaundice. I was able to speak with the patient wife and daughter at bedside today. The patient was asleep. I introduced myself, my role, and reason for consult. The patient wife reports that the patient has a PCP. The patient wife reports that the patient lives in the home with her.   The patient wife reports that the patient was able to complete daily living task independently before being admitted to the hospital. The patient wife reports that she or the patient would drive to medical appointments. The patient wife reports that the patient family would assist during D/C. The patient wife reports that the patient has had HH in the past. The patient wife reports that the patient has HH with [facility]. The patient wife reports that [facility] was in the home 2 weeks ago. The patient wife reports that the patient has never been admitted into a SNF. The patient wife reports that the patient hs a canr, rolling walker, walk, shower seat and shower bar in the home.   The patient wife inquired about the SNF process. I informed the patient wife about SNF process. The patient wife verbalized understanding. There are no TOC needs at this time.   TOC will follow the patient until D/C.   2:28: Lonell confirmed that the patient is not active with [facility]. SW will follow up with the patient family.    Barriers to Discharge: Continued Medical Work up   Patient Goals and CMS Choice            Expected Discharge Plan and Services       Living arrangements for the past 2 months: Single Family Home                                      Prior Living  Arrangements/Services Living arrangements for the past 2 months: Single Family Home Lives with:: Spouse Patient language and need for interpreter reviewed:: Yes Do you feel safe going back to the place where you live?: Yes      Need for Family Participation in Patient Care: Yes (Comment) Care giver support system in place?: Yes (comment)   Criminal Activity/Legal Involvement Pertinent to Current Situation/Hospitalization: No - Comment as needed  Activities of Daily Living      Permission Sought/Granted Permission sought to share information with : Family Supports    Share Information with NAME: Moxon Messler     Permission granted to share info w Relationship: Spouse  Permission granted to share info w Contact Information: Stephanos Fan: [phone]  Emotional Assessment Appearance:: Appears stated age Attitude/Demeanor/Rapport: Unable to Assess Affect (typically observed): Unable to Assess   Alcohol / Substance Use: Not Applicable Psych Involvement: No (comment)  Admission diagnosis:  Hyperbilirubinemia [E80.6] Obstructive jaundice (HCC) [K83.1] Patient Active Problem List   Diagnosis Date Noted   Protein-calorie malnutrition, severe 03/17/2024   GERD without esophagitis 03/15/2024   Pancreatic cancer (HCC) 03/15/2024   Edema 03/12/2024   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   Obstructive jaundice (  HCC) 03/04/2024   Elevated LFTs 03/04/2024   Gastritis without bleeding 12/31/2023   Pancreatic mass 12/31/2023   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   Abdominal pain 12/04/2023   HTN (hypertension) 12/04/2023   Obesity (BMI 30-39.9) 12/04/2023   Diarrhea 12/04/2023   GERD (gastroesophageal reflux disease) 09/18/2023   UTI (urinary tract infection) 08/28/2023   Hyponatremia 08/14/2023   Bilateral leg edema 01/30/2023   Lung nodules 01/16/2023   Weight loss 12/19/2022   Subconjunctival hemorrhage 09/12/2022   Epistaxis 07/04/2022    BPH (benign prostatic hyperplasia) 03/28/2022   Need for prophylactic vaccination and inoculation against influenza 03/28/2022   Elevated PSA 01/03/2022   Constipation 10/26/2021   Chemotherapy induced diarrhea 09/28/2021   Hypotension due to hypovolemia 05/18/2021   Port-A-Cath in place 03/08/2021   Normocytic anemia 12/02/2020   Aortic atherosclerosis 08/02/2020   Hepatic steatosis 08/02/2020   Chemotherapy-induced neutropenia 12/20/2019   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   Cellulitis of left lower extremity 11/03/2019   Pseudogout of foot, left 11/03/2019   Hypomagnesemia 10/29/2019   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   Sepsis (HCC) 08/16/2019   Cellulitis of right lower extremity 08/16/2019   Maintenance chemotherapy 08/16/2019   Hypokalemia 07/23/2019   Lesion of right lung 07/01/2019   Nephrolithiasis 07/01/2019   Osteoarthritis 07/01/2019   Malignant neoplasm of transverse colon (HCC) 06/25/2019   Encounter for antineoplastic chemotherapy 06/25/2019   Goals of care, counseling/discussion 06/25/2019   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 [place] [address] Phone: [phone] Fax: [phone]     Social Drivers of Health (SDOH) Social History: SDOH Screenings   Food Insecurity: Patient Unable To Answer (03/16/2024)  Housing: Patient Unable To Answer (03/16/2024)  Transportation Needs: Patient Unable To Answer (03/16/2024)  Utilities: Patient Unable To Answer (03/16/2024)  Social Connections: Patient Unable To Answer (03/16/2024)  Tobacco Use: Low Risk  (03/15/2024)   SDOH Interventions: Food Insecurity Interventions: Patient  Unable to Answer Housing Interventions: Patient Unable to Answer Utilities Interventions: Patient Unable to Answer   Readmission Risk Interventions    03/18/2024    2:14 PM 03/06/2024   10:55 AM  Readmission Risk Prevention Plan  Transportation Screening Complete Complete  PCP or Specialist Appt within 3-5 Days  Complete  HRI or Home Care Consult  --  Social Work Consult for Recovery Care Planning/Counseling  --  Palliative Care Screening  Not Applicable  Medication Review Ocean[facility]) Complete Complete  PCP or Specialist appointment within 3-5 days of discharge Complete   SW Recovery Care/Counseling Consult Complete   Palliative Care Screening Not Applicable   Skilled Nursing Facility Not Applicable

## 2024-03-18 NOTE — Plan of Care (Signed)
  Problem: Respiratory: Goal: Ability to maintain adequate ventilation will improve Outcome: Progressing   Problem: Health Behavior/Discharge Planning: Goal: Ability to manage health-related needs will improve Outcome: Progressing   Problem: Coping: Goal: Level of anxiety will decrease Outcome: Progressing   Problem: Clinical Measurements: Goal: Diagnostic test results will improve Outcome: Not Progressing   Problem: Activity: Goal: Risk for activity intolerance will decrease Outcome: Not Progressing   Problem: Nutrition: Goal: Adequate nutrition will be maintained Outcome: Not Progressing

## 2024-03-19 ENCOUNTER — Other Ambulatory Visit: Payer: Self-pay | Admitting: Oncology

## 2024-03-19 DIAGNOSIS — C7889 Secondary malignant neoplasm of other digestive organs: Secondary | ICD-10-CM

## 2024-03-19 LAB — CBC
HCT: 27.4 % — ABNORMAL LOW (ref 39.0–52.0)
Hemoglobin: 9.6 g/dL — ABNORMAL LOW (ref 13.0–17.0)
MCH: 30.8 pg (ref 26.0–34.0)
MCHC: 35 g/dL (ref 30.0–36.0)
MCV: 87.8 fL (ref 80.0–100.0)
Platelets: 135 K/uL — ABNORMAL LOW (ref 150–400)
RBC: 3.12 MIL/uL — ABNORMAL LOW (ref 4.22–5.81)
RDW: 20 % — ABNORMAL HIGH (ref 11.5–15.5)
WBC: 14.9 K/uL — ABNORMAL HIGH (ref 4.0–10.5)
nRBC: 0.3 % — ABNORMAL HIGH (ref 0.0–0.2)

## 2024-03-19 LAB — BASIC METABOLIC PANEL WITH GFR
Anion gap: 15 (ref 5–15)
Anion gap: 13 (ref 5–15)
BUN: 45 mg/dL — ABNORMAL HIGH (ref 8–23)
BUN: 47 mg/dL — ABNORMAL HIGH (ref 8–23)
CO2: 13 mmol/L — ABNORMAL LOW (ref 22–32)
CO2: 14 mmol/L — ABNORMAL LOW (ref 22–32)
Calcium: 8.8 mg/dL — ABNORMAL LOW (ref 8.9–10.3)
Calcium: 9 mg/dL (ref 8.9–10.3)
Chloride: 109 mmol/L (ref 98–111)
Chloride: 110 mmol/L (ref 98–111)
Creatinine, Ser: 2.48 mg/dL — ABNORMAL HIGH (ref 0.61–1.24)
Creatinine, Ser: 2.64 mg/dL — ABNORMAL HIGH (ref 0.61–1.24)
GFR, Estimated: 25 mL/min — ABNORMAL LOW (ref >60)
GFR, Estimated: 23 mL/min — ABNORMAL LOW (ref >60)
Glucose, Bld: 93 mg/dL (ref 70–99)
Glucose, Bld: 97 mg/dL (ref 70–99)
Potassium: 3.7 mmol/L (ref 3.5–5.1)
Potassium: 3.7 mmol/L (ref 3.5–5.1)
Sodium: 137 mmol/L (ref 135–145)
Sodium: 137 mmol/L (ref 135–145)

## 2024-03-19 LAB — HEPATIC FUNCTION PANEL
ALT: 60 U/L — ABNORMAL HIGH (ref 0–44)
AST: 113 U/L — ABNORMAL HIGH (ref 15–41)
Albumin: 2 g/dL — ABNORMAL LOW (ref 3.5–5.0)
Alkaline Phosphatase: 242 U/L — ABNORMAL HIGH (ref 38–126)
Bilirubin, Direct: 8.4 mg/dL — ABNORMAL HIGH (ref 0.0–0.2)
Indirect Bilirubin: 7.4 mg/dL — ABNORMAL HIGH (ref 0.3–0.9)
Total Bilirubin: 15.8 mg/dL — ABNORMAL HIGH (ref 0.0–1.2)
Total Protein: 5.1 g/dL — ABNORMAL LOW (ref 6.5–8.1)

## 2024-03-19 LAB — GLUCOSE, CAPILLARY
Glucose-Capillary: 93 mg/dL (ref 70–99)
Glucose-Capillary: 87 mg/dL (ref 70–99)
Glucose-Capillary: 92 mg/dL (ref 70–99)
Glucose-Capillary: 96 mg/dL (ref 70–99)
Glucose-Capillary: 123 mg/dL — ABNORMAL HIGH (ref 70–99)

## 2024-03-19 LAB — MAGNESIUM: Magnesium: 2.3 mg/dL (ref 1.7–2.4)

## 2024-03-19 LAB — PHOSPHORUS: Phosphorus: 4.1 mg/dL (ref 2.5–4.6)

## 2024-03-19 LAB — PROTIME-INR
INR: 1.8 — ABNORMAL HIGH (ref 0.8–1.2)
Prothrombin Time: 22.1 s — ABNORMAL HIGH (ref 11.4–15.2)

## 2024-03-19 MED ORDER — THIAMINE HCL 100 MG PO TABS
100.0000 mg | ORAL_TABLET | Freq: Every day | ORAL | Status: DC
Start: 2024-03-20 — End: 2024-03-27
  Administered 2024-03-20: 100 mg
  Filled 2024-03-19 (×2): qty 1

## 2024-03-19 MED ORDER — SODIUM CHLORIDE 0.9 % IV SOLN
2.0000 g | INTRAVENOUS | Status: DC
  Administered 2024-03-20: 2 g via INTRAVENOUS
  Filled 2024-03-19: qty 12.5

## 2024-03-19 MED ORDER — FREE WATER
30.0000 mL | Status: DC
  Administered 2024-03-19 – 2024-03-20 (×5): 30 mL

## 2024-03-19 MED ORDER — PROSOURCE TF20 ENFIT COMPATIBL EN LIQD
60.0000 mL | Freq: Every day | ENTERAL | Status: DC
  Administered 2024-03-20: 60 mL
  Filled 2024-03-19: qty 60

## 2024-03-19 MED ORDER — VITAL 1.5 CAL PO LIQD
1000.0000 mL | ORAL | Status: DC
  Administered 2024-03-19 – 2024-03-20 (×2): 1000 mL

## 2024-03-19 NOTE — Plan of Care (Signed)
  Problem: Clinical Measurements: Goal: Diagnostic test results will improve Outcome: Progressing Goal: Signs and symptoms of infection will decrease Outcome: Progressing   Problem: Clinical Measurements: Goal: Will remain free from infection Outcome: Progressing

## 2024-03-19 NOTE — Progress Notes (Signed)
 IR Procedure Request - Biliary Drain   83 y.o. male inpatient.  History of colon cancer s/p radiation and chemotherapy. Found to have biliary obstruction secondary to metastatic pancreatic mass s/p ERCP with stent placement on 10.21.25. Presented to the ED at [facility] on 10.31.25 with [facility] Found to have leukocytosis and hyperbilibrumbenima. ERCP performed on 11.1.25 with stent exchange. Hospital stay complicated by PEA arrest  pulseless electrical activity (PEA) upon intubation. (WBC  - 20.4,  Hgb 9.7, PLT 138K, Cr - 1.88, INR improving to 1.8 (peak:4.5), and bilirubin again increasing 18.3, ammonia 52.  biliary drain placement.  Case reviewed by IR Attending Dr. Wilkie Lent. Upon review of imaging the patient appears to have ascites which is a contraindication for percutaneous bilary drain placement. Case discussed directly between IR Attending and GI.  Plan for GI to evaluate for possible ERCP / sphincterotomy and  placement of larger metal stent.

## 2024-03-19 NOTE — ED Provider Notes (Signed)
 MRCP concerning for migration of patient's biliary stent as a cause for increasing T. bili.  I spoke to Dr. Therisa on-call for our gastroenterology team who feels that patient would benefit from ERCP however we do not have that capability over the weekend.  I spoke to Dr. Leigh in gastroenterology at [facility] who does not feel that the patient requires urgent transfer at this time however if they do need intervention would encourage interventional radiology to place a percutaneous drain.  I spoke to the on-call interventional radiologist who agrees that this would certainly be possible over the weekend as needed if patient does not improve with IV antibiotics.  I spoke to the on-call hospital team for admission who was agreed to accept this patient for further evaluation and management. Dispo: Admit to medicine   Tomoya Ringwald K, MD 03/19/24 [phone]

## 2024-03-19 NOTE — Progress Notes (Signed)
 Nutrition Follow Up Note   DOCUMENTATION CODES:   Severe malnutrition in context of chronic illness  INTERVENTION:   Vital 1.5@60ml /hr- Initiate at 36ml/hr and increase by 10ml/hr q8 hours until goal rate is reached.   ProSource TF 20- Give 60ml daily via tube, each supplement provides 80kcal and 20g of protein.   Free water flushes 30ml q4 hours to maintain tube patency   Regimen provides 2240kcal/day, 117g/day protein and 1280ml/day of free water.   Thiamine 100mg  daily via tube x 7 days   Pt at high refeed risk; recommend monitor potassium, magnesium  and phosphorus labs daily until stable  Daily weights   NUTRITION DIAGNOSIS:   Severe Malnutrition related to cancer and cancer related treatments as evidenced by severe fat depletion, severe muscle depletion. 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  Pt is more alert today. Pt remains NPO. NGT to LIS with output. Pt is having bowel function. CT scan from 11/3 reported resolved SBO; pt also noted to have anasarca and bilateral pleural effusions. IR unable to place biliary drain secondary to ascites. Plan is for possible ERCP to place a larger stent. Pt is now without adequate nutrition for > 5 days. Will initiate tube feeds today. Pt is at high refeed risk. Per chart, pt is up ~17lbs since admission. Pt remains up ~26lbs from  his UBW. Pt with significant anasarca on exam. Lasix  held today for worsening renal function.   Medications reviewed and include: solu-cortef, midodrine , protonix , cefepime , metronidazole   Labs reviewed: Na 137 wnl, K 3.7 wnl, BUN 45(H), creat 2.48(H), P 4.1 wnl, Mg 2.3 wnl Tbili- 18.3(H)- 11/4 Wbc- 14.9(H), Hgb 9.6(L), Hct 27.4(L) Cbgs- 92, 87, 93 x 24 hrs   UOP-   Diet Order:   Diet Order             Diet NPO time specified  Diet effective midnight                  EDUCATION NEEDS:   No education needs have been identified at this time  Skin:  Skin Assessment: Reviewed RN Assessment (ecchymosis, jaundice)  Last BM:  11/4- type 6  Height:   Ht Readings from Last 1 Encounters:  03/14/24 5' 8 (1.727 m)    Weight:   Wt Readings from Last 1 Encounters:  03/19/24 96.7 kg    Ideal Body Weight:  70 kg  BMI:  Body mass index is 32.41 kg/m.  Estimated Nutritional Needs:   Kcal:  2000-2300kcal/day  Protein:  100-115g/day  Fluid:  1.8-2.1L/day  Jared Shams MS, RD, LDN If unable to be reached, please send secure chat to RD inpatient available from 8:00a-4:00p daily

## 2024-03-19 NOTE — Progress Notes (Signed)
 NAME:  Jared Gutierrez, MRN:  [id number], DOB:  [birth year], LOS: 5 ADMISSION DATE:  03/14/2024, CONSULTATION DATE:  03/16/2024 REFERRING MD:  Madison Peaches CHIEF COMPLAINT:  Sepsis with shock   History of Present Illness:  Mr. Jared Gutierrez is an 83 y.o. 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 He has not had a bowel movement within the previous 3-4 days and now has developed worsening abdominal distention, nausea and vomiting.  ED Course: Initial vital signs showed HR of 83 beats/minute, BP 105/42mm Hg, the RR 20 breaths/minute, 100 %O2 Sat on RA and Temp of 98.61F (36.7C).      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 11/2: Remained hypotensive post-op requiring pressor. PCCM consulted 11/3: No overnight events. Off of pressors. Remains on abx, midodrine  and hydrocortisone. GI following. 03/18/24- patient s/p GI eval- recommendation for perc biliary drain due to increased pancreatic mass effect.  Appears more lethargic today.  Tbili is worsening from 14 to 18.  No UOP today with bladder scan <30cc.  RUQ US  was inconclusive.  Surgery consult and med/onc consult today for additional eval regarding acute cholecystitis, and cancer options. 03/19/24- patient is more awake, UOP is  better, possible perc chole drain today. Mild aki overnight will dc his lasix  today.  Overall prognosis is poor  Interim History / Subjective:  See above listed under Significant Hospital Events.  Micro Data: 11/1: Blood Cultures x 2 >> 11/1: MRSA PCR >> negative   Antimicrobials: Vancomycin  11/2 >> Cefepime  11/2 >> Metronidazole  11/2 >> Ceftriaxone  11/1, stopped 11/2   Objective    Blood pressure 112/69, pulse 67, temperature (!) 97.2 F (36.2 C), resp. rate (!) 21, height 5' 8 (1.727 m), weight 96.7 kg, SpO2 96%.        Intake/Output Summary (Last 24 hours) at 03/19/2024 0841 Last data filed at 03/19/2024 9355 Gross per 24 hour  Intake 200 ml  Output 265 ml  Net -65 ml   Filed Weights   03/14/24 1217 03/15/24 1707 03/19/24 0500  Weight: 92.1 kg 89.2 kg 96.7 kg    Examination: General: ill appearing male, lying in bed in NAD HENT: atraumatic, normocephalic, supple, no JVD, scleral icterus Lungs: clear to auscultation bilaterally, no wheezes/crackles/rhonchi Cardiovascular: RRR, S1 S2, no m/r/g Abdomen: mildly soft, non-distended, no rebound/guarding, hypoactive bowel sounds x 4 Extremities: warm, dry, intact, radial pulses 2+, distal pulses 1+, UE edema 1+, LE edema 2+, jaundice Neuro: alert to voice and physical stimuli, oriented to self and place only, follows commands, moves all extremities GU: indwelling foley catheter draining minimal clear yellow urine  Resolved problem list   Assessment and Plan   #Septic Shock with Multiorgan Failure secondary to Acute Ascending Cholangitis - Trend WBC and monitor fever curve - Vasopressors to maintain MAP goal > 65 ~weaned off of levophed + vasopressin - Continue midodrine  and hydrocortisone to assist with  BP - A line in place - IV fluid resuscitation as able; will stop D5LR - Cultures pending - ABX: continue Cefepime  + Metronidazole  + Vanc - Narrow ABX per culture and sensitivities - Trend lactic acid; trending  down, now at 2.3   #Acute Cholangitis #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 Progressive intrahepatic and extrahepatic biliary duct dilation despite interval placement of bile duct stent. The downstream aspect of the biliary stent projects near the ampulla not clearly within the lumen of the duodenum, and may not provide adequate decompression of the biliary tree. 2. Marked gallbladder distension without evidence of cholelithiasis or acute cholecystitis. 3. Stable complex cystic and solid mass within the pancreatic body, with continued extrinsic compression upon the portal vein, upstream pancreatic duct dilation, and atrophy of the pancreatic body and tail. This has been previously evaluated by MRI. 4. Distended fluid-filled stomach, which may require decompression with enteric catheter. No evidence of bowel obstruction or ileus. 5. Trace ascites. 6.  Aortic Atherosclerosis 10/31 MRCP:  - Constipation protocol - Antiemetics prn: Zofran  - GI following, appreciate input - Continue ABX - Repeat CT Abd/Pelvis today   #Right Lowe Lobe Bleb - Supplemental oxygen to maintain sats >92% - RT interventions with caution give risk for pneumothorax - High risk for pneumothorax requiring chest tube   #Acute Kidney Injury secondary to Septic Shock #Anion Gap Metabolic Acidosis #Hypokalemia - Strict I/O's - Trend BMP and monitor renal function - Creatinine improving; 1.32 this am (Baseline 0.3-0.78) - Potassium 3.3, replace per pharmacy - Avoid nephrotoxins as able - Ensure renal perfusion - Electrolyte replacement protocol; pharmacy to assist with replacement - May need to bladder scan if UO doesn't improve   #Small Bowel Obstruction #Transaminitis #Hyperbilirubinemia #Hypoalbuminemia #Trace Ascites #GERD - Monitor hepatic function - Tbili trending down, 14.0 this am - AST/ALT: 109/59 - Avoid  hepatotoxins as able - GI Prophylaxis: Protonix  40mg  - Feeds: NG tube in place, continue to suction for SBO - Lipase pending to r/o pancreatitis   #Thrombocytopenia #Anemia #Biliary Obstruction induced Coagulopathy - Trend CBC - Monitor PT/INR, INR 2.5 this am, trending down - Previously received 1 unit FFP and Vitamin K - Transfuse if Hgb < 7; 6.8 this am, 1 unit pRBCs - Monitor for s/sx of bleeding - DVT Prophylaxis: SCDs - DIC workup (Fibrinogen) and smear pending   #Hypoglycemia - ICU hypo/hyperglycemia protocol - CBG Q4h - Glucose goal range: 140-180 - Discontinue D5LR given BG normalized and pt with significant edema    Labs   CBC: Recent Labs  Lab 03/12/24 1304 03/14/24 1313 03/15/24 1534 03/16/24 0221 03/17/24 0410 03/17/24 1610 03/18/24 0406 03/19/24 0432  WBC 5.7   < > 25.8* 23.4* 13.9*  --  20.4* 14.9*  NEUTROABS 4.6  --   --   --  13.1*  --   --   --   HGB 8.6*   < > 10.3* 8.1* 6.8* 8.8* 9.7* 9.6*  HCT 25.5*   < > 29.5* 23.5* 19.4* 25.1* 27.1* 27.4*  MCV 95.1   < > 93.7 94.0 91.9  --  86.9 87.8  PLT 139*   < > 185 143* 110*  --  138* 135*   < > = values in this interval not displayed.    Basic Metabolic Panel: Recent Labs  Lab 03/16/24 0221 03/16/24 1609 03/17/24 0410 03/17/24 1723 03/17/24 2253 03/18/24 0406 03/19/24 0432  NA 137 133* 133*  --   --  135 137  K 2.6* 3.3* 3.3* 3.5 3.7 3.5 3.7  CL 109 106 109  --   --  110 109  CO2 14* 15* 16*  --   --  15* 13*  GLUCOSE 115* 148* 154*  --   --  91 93  BUN 16 20 21   --   --  31* 45*  CREATININE 1.31* 1.36* 1.32*  --   --  1.88* 2.48*  CALCIUM  7.8* 8.0* 8.1*  --   --  8.9 8.8*  MG 1.3*  --  2.1  --  2.2 2.2 2.3  PHOS  --   --  3.0  --   --  3.6 4.1   GFR: Estimated Creatinine Clearance: 25.4 mL/min (A) (by C-G formula based on SCr of 2.48 mg/dL (H)). Recent Labs  Lab 03/16/24 0221 03/16/24 0222 03/16/24 0813 03/16/24 1214 03/16/24 1609 03/16/24 2045 03/17/24 0410 03/18/24 0406  03/19/24 0432  WBC 23.4*  --   --   --   --   --  13.9* 20.4* 14.9*  LATICACIDVEN  --    < > 2.7* 2.7* 2.6* 2.3*  --   --   --    < > = values in this interval not displayed.    Liver Function Tests: Recent Labs  Lab 03/15/24 0744 03/15/24 2306 03/16/24 0407 03/17/24 0410 03/18/24 0406  AST 168* 249* 225* 109* 113*  ALT 68* 89* 86* 59* 63*  ALKPHOS 241* 183* 162* 110 166*  BILITOT 18.6* 16.2* 15.4* 14.0* 18.3*  PROT 5.5* 4.9* 4.6* 4.4* 5.2*  ALBUMIN 2.0* 1.9* 1.7* 2.0* 2.3*   Recent Labs  Lab 03/14/24 1313 03/17/24 1401  LIPASE 23 19   Recent Labs  Lab 03/18/24 0927  AMMONIA 52*    ABG    Component Value Date/Time   O2SAT 85 03/16/2024 0813     Coagulation Profile: Recent Labs  Lab 03/15/24 0744 03/16/24 0654 03/16/24 1609 03/17/24 0410 03/18/24 0406  INR 4.5* 3.6* 3.0* 2.5* 1.8*    Cardiac Enzymes: No results for input(s): CKTOTAL, CKMB, CKMBINDEX, TROPONINI in the last 168 hours.  HbA1C: Hemoglobin A1C  Date/Time Value Ref Range Status  05/22/2011 06:49 AM 5.3 4.2 - 6.3 % Final    Comment:    The American Diabetes Association recommends that a primary goal of therapy should be <7% and that physicians should reevaluate the treatment regimen in patients with HbA1c values consistently >8%.    Hgb A1c MFr Bld  Date/Time Value Ref Range Status  04/19/2021 08:32 AM 5.3 4.8 - 5.6 % Final    Comment:    (NOTE)         Prediabetes: 5.7 - 6.4         Diabetes: >6.4         Glycemic control for adults with diabetes: <7.0     CBG: Recent Labs  Lab 03/18/24 1545 03/18/24 1927 03/18/24 2337 03/19/24 0406 03/19/24 0811  GLUCAP 80 81 85 93 87    Review of Systems:   Review of Systems  Constitutional:  Negative for chills and fever.  Respiratory:  Negative for cough, shortness of breath and wheezing.   Cardiovascular:  Negative for chest pain and palpitations.  Gastrointestinal:  Positive for nausea. Negative for vomiting.   Genitourinary:  Negative for dysuria.  Neurological:  Negative for dizziness and headaches.     Past Medical History:  He,  has a past medical history of Arthritis, Cancer (HCC), Cavitary lesion of lung,  Chicken pox, Colon cancer (HCC), History of kidney stones, Hypertension, Lipoma of colon, Nephrolithiasis, Nephrolithiasis, Obesity, Shingles, and Tubular adenoma of colon.   Surgical History:   Past Surgical History:  Procedure Laterality Date   BILIARY STENT PLACEMENT  03/04/2024   Procedure: INSERTION, STENT, BILE DUCT;  Surgeon: Jinny Carmine, MD;  Location: [facility] ENDOSCOPY;  Service: Endoscopy;;   BILIARY STENT PLACEMENT  03/15/2024   Procedure: INSERTION, STENT, BILE DUCT;  Surgeon: Jinny Carmine, MD;  Location: [facility] ENDOSCOPY;  Service: Endoscopy;;   COLON SURGERY     COLONOSCOPY N/A 10/02/2014   Procedure: COLONOSCOPY;  Surgeon: Donnice Vaughn Manes, MD;  Location: [facility] ENDOSCOPY;  Service: Endoscopy;  Laterality: N/A;   COLONOSCOPY WITH PROPOFOL  N/A 02/16/2017   Procedure: COLONOSCOPY WITH PROPOFOL ;  Surgeon: Viktoria Lamar DASEN, MD;  Location: [facility] ENDOSCOPY;  Service: Endoscopy;  Laterality: N/A;   COLONOSCOPY WITH PROPOFOL  N/A 10/05/2020   Procedure: COLONOSCOPY WITH PROPOFOL ;  Surgeon: Maryruth Ole DASEN, MD;  Location: [facility] ENDOSCOPY;  Service: Endoscopy;  Laterality: N/A;   ERCP N/A 03/04/2024   Procedure: ERCP, WITH INTERVENTION IF INDICATED;  Surgeon: Jinny Carmine, MD;  Location: [facility] ENDOSCOPY;  Service: Endoscopy;  Laterality: N/A;   ERCP N/A 03/15/2024   Procedure: ERCP, WITH INTERVENTION IF INDICATED;  Surgeon: Jinny Carmine, MD;  Location: [facility] ENDOSCOPY;  Service: Endoscopy;  Laterality: N/A;   ESOPHAGOGASTRODUODENOSCOPY N/A 12/31/2023   Procedure: EGD (ESOPHAGOGASTRODUODENOSCOPY);  Surgeon: Wilhelmenia Aloha Raddle., MD;  Location: [facility];  Service: Gastroenterology;  Laterality: N/A;   EUS N/A 04/17/2019   Procedure: FULL UPPER ENDOSCOPIC ULTRASOUND (EUS) RADIAL;   Surgeon: Queenie Asberry LABOR, MD;  Location: [facility] ENDOSCOPY;  Service: Gastroenterology;  Laterality: N/A;   EUS N/A 12/31/2023   Procedure: ULTRASOUND, UPPER GI TRACT, ENDOSCOPIC;  Surgeon: Wilhelmenia Aloha Raddle., MD;  Location: [facility] ENDOSCOPY;  Service: Gastroenterology;  Laterality: N/A;   FINE NEEDLE ASPIRATION  12/31/2023   Procedure: FINE NEEDLE ASPIRATION;  Surgeon: Wilhelmenia Aloha Raddle., MD;  Location: [facility] ENDOSCOPY;  Service: Gastroenterology;;   KIDNEY STONE SURGERY     PARTIAL COLECTOMY  10/17/2013   PORTACATH PLACEMENT Right 06/13/2019   Procedure: INSERTION PORT-A-CATH;  Surgeon: Tye Millet, DO;  Location: [facility] ORS;  Service: General;  Laterality: Right;   STENT REMOVAL  03/15/2024   Procedure: STENT REMOVAL;  Surgeon: Jinny Carmine, MD;  Location: [facility] ENDOSCOPY;  Service: Endoscopy;;     Social History:   reports that he has never smoked. He has never used smokeless tobacco. He reports that he does not currently use alcohol. He reports that he does not use drugs.   Family History:  His family history includes Breast cancer in his mother; COPD in his father; Cancer in his mother.   Allergies No Known Allergies   Home Medications  Prior to Admission medications   Medication Sig Start Date End Date Taking? Authorizing Provider  cholestyramine (QUESTRAN) 4 g packet Take 1 packet (4 g total) by mouth 3 (three) times daily for 14 days. 03/06/24 03/20/24 Yes Agbata, Tochukwu, MD  cyanocobalamin (VITAMIN B12) 500 MCG tablet Take by mouth. 07/26/23 07/25/24 Yes [provider]  feeding supplement (ENSURE PLUS HIGH PROTEIN) LIQD Take 237 mLs by mouth 2 (two) times daily between meals. 03/06/24 04/05/24 Yes Agbata, Tochukwu, MD  furosemide  (LASIX ) 20 MG tablet Take 1 tablet (20 mg total) by mouth daily as needed for edema. 03/12/24  Yes Babara Call, MD  omeprazole  (PRILOSEC) 20 MG capsule Take 20 mg by mouth daily. 12/25/23  Yes [provider]  ondansetron  (ZOFRAN ) 8 MG  tablet Take 1 tablet (8 mg total) by mouth 2 (two) times daily as needed for refractory nausea / vomiting. Start on day 3 after chemotherapy. 11/16/20  Yes Babara Call, MD  pantoprazole  (PROTONIX ) 40 MG tablet Take 1 tablet (40 mg total) by mouth every morning. Take in the morning on an empty stomach 12/07/23 03/15/24 Yes Sreenath, Calvin NOVAK, MD  potassium chloride  SA (KLOR-CON  M) 20 MEQ tablet Take 1 tablet (20 mEq total) by mouth daily. 03/12/24  Yes Babara Call, MD  Probiotic Product (PROBIOTIC DAILY PO) Take 1 tablet by mouth daily.   Yes [provider]  tamsulosin  (FLOMAX ) 0.4 MG CAPS capsule Take 0.4 mg by mouth daily after supper. 03/03/24  Yes [provider]  lidocaine -prilocaine  (EMLA ) cream Apply small amount to port and cover with saran wrap 1-2 hours prior to port access Patient not taking: Reported on 03/15/2024 08/27/23   Babara Call, MD  loratadine (CLARITIN) 10 MG tablet Take 10 mg by mouth daily. Patient not taking: Reported on 03/15/2024    [provider]     Critical care provider statement:   Total critical care time: 33 minutes   Performed by: Parris MD   Critical care time was exclusive of separately billable procedures and treating other patients.   Critical care was necessary to treat or prevent imminent or life-threatening deterioration.   Critical care was time spent personally by me on the following activities: development of treatment plan with patient and/or surrogate as well as nursing, discussions with consultants, evaluation of patient's response to treatment, examination of patient, obtaining history from patient or surrogate, ordering and performing treatments and interventions, ordering and review of laboratory studies, ordering and review of radiographic studies, pulse oximetry and re-evaluation of patient's condition.    Eraina Winnie, M.D.  Pulmonary & Critical Care Medicine

## 2024-03-19 NOTE — Progress Notes (Signed)
 PHARMACY CONSULT NOTE - FOLLOW UP  Pharmacy Consult for Electrolyte Monitoring and Replacement   Recent Labs: Potassium (mmol/L)  Date Value  03/19/2024 3.7  04/27/2014 3.7   Magnesium  (mg/dL)  Date Value  88/94/7974 2.3  05/22/2011 2.2   Calcium  (mg/dL)  Date Value  88/94/7974 8.8 (L)   Calcium , Total (mg/dL)  Date Value  87/85/7984 9.4   Albumin (g/dL)  Date Value  88/95/7974 2.3 (L)  04/27/2014 3.3 (L)   Phosphorus (mg/dL)  Date Value  88/94/7974 4.1   Sodium (mmol/L)  Date Value  03/19/2024 137  04/27/2014 142    Assessment: 83 y.o. male with medical history significant for essential hypertension, urolithiasis recurrent colon and pancreatic cancer in chemotherapy holiday from June of this year, with recent biliary obstruction status post biliary stent placement on 03/04/2024 by Dr. Jinny, who presented to the emergency room with acute onset of worsening jaundice and right upper quadrant abdominal pain. Pharmacy is asked to follow and replace electrolytes  Renal function not at baseline (~0.7). Concerns for AKI have not resolved.  Diet: npo - NG/OG tube  Goal of Therapy:  Electrolytes WNL  Plan:  ---no electrolyte replacement warranted today ---recheck electrolytes in am  Royce Curtis, PharmD Candidate 2027 03/19/2024 7:54 AM

## 2024-03-19 NOTE — Progress Notes (Addendum)
 PHARMACY CONSULT NOTE - FOLLOW UP  Pharmacy Consult for Electrolyte Monitoring and Replacement   Recent Labs: Potassium (mmol/L)  Date Value  03/19/2024 3.7  04/27/2014 3.7   Magnesium  (mg/dL)  Date Value  88/94/7974 2.3  05/22/2011 2.2   Calcium  (mg/dL)  Date Value  88/94/7974 8.8 (L)   Calcium , Total (mg/dL)  Date Value  87/85/7984 9.4   Albumin (g/dL)  Date Value  88/95/7974 2.3 (L)  04/27/2014 3.3 (L)   Phosphorus (mg/dL)  Date Value  88/94/7974 4.1   Sodium (mmol/L)  Date Value  03/19/2024 137  04/27/2014 142    Assessment: 83 y.o. male with medical history significant for essential hypertension, urolithiasis recurrent colon and pancreatic cancer in chemotherapy holiday from June of this year, with recent biliary obstruction status post biliary stent placement on 03/04/2024 by Dr. Jinny, who presented to the emergency room with acute onset of worsening jaundice and right upper quadrant abdominal pain. Pharmacy is asked to follow and replace electrolytes  Renal function not at baseline (~0.7). Concerns for AKI have not resolved.  Diet: npo - NG/OG tube - VITAL 1.5 CAL 60 ml/hr - Free water 30 mL q4h  Goal of Therapy:  Electrolytes WNL  Plan:  ---no electrolyte replacement warranted today ---recheck electrolytes in am  Leonor JAYSON Argyle, PharmD  03/19/2024 11:54 AM

## 2024-03-19 NOTE — TOC Progression Note (Signed)
 Transition of Care [facility]) - Progression Note    Patient Details  Name: MACSEN NUTTALL MRN: [id number] Date of Birth: 09/24/1940  Transition of Care [facility]) CM/SW Contact  K'La JINNY Ruts, LCSW Phone Number: 03/19/2024, 9:39 AM  Clinical Narrative:    Chart reviewed. SW received message from [place]  [facility] rep and confirmed that the patient is active for PT.      Barriers to Discharge: Continued Medical Work up               Expected Discharge Plan and Services       Living arrangements for the past 2 months: Single Family Home                                       Social Drivers of Health (SDOH) Interventions SDOH Screenings   Food Insecurity: Patient Unable To Answer (03/16/2024)  Housing: Patient Unable To Answer (03/16/2024)  Transportation Needs: Patient Unable To Answer (03/16/2024)  Utilities: Patient Unable To Answer (03/16/2024)  Social Connections: Patient Unable To Answer (03/16/2024)  Tobacco Use: Low Risk  (03/15/2024)    Readmission Risk Interventions    03/18/2024    2:14 PM 03/06/2024   10:55 AM  Readmission Risk Prevention Plan  Transportation Screening Complete Complete  PCP or Specialist Appt within 3-5 Days  Complete  HRI or Home Care Consult  --  Social Work Consult for Recovery Care Planning/Counseling  --  Palliative Care Screening  Not Applicable  Medication Review Oceanographer) Complete Complete  PCP or Specialist appointment within 3-5 days of discharge Complete   SW Recovery Care/Counseling Consult Complete   Palliative Care Screening Not Applicable   Skilled Nursing Facility Not Applicable

## 2024-03-19 NOTE — Progress Notes (Signed)
 Jared Copping, MD [facility]   [address], [address] [place], [place] [address] Phone: [phone] Fax : [phone]   Subjective: This patient is off of pressors today.  The patient's LFTs and INR have not been checked today.  He had an increase in his white cell count and LFTs yesterday despite a CT scan the day earlier showing a patent bile duct stent.  His white count has gone down from 20 to 14.9.   Objective: Vital signs in last 24 hours: Vitals:   03/19/24 0800 03/19/24 0900 03/19/24 1000 03/19/24 1100  BP: 112/69 (!) 102/58 93/60 112/62  Pulse: 67 (!) 53 (!) 56 66  Resp: (!) 21 20 10 15   Temp: (!) 97.2 F (36.2 C) (!) 97.2 F (36.2 C) (!) 97.3 F (36.3 C) (!) 97.2 F (36.2 C)  TempSrc:      SpO2: 96% 95% 96% 97%  Weight:      Height:       Weight change:   Intake/Output Summary (Last 24 hours) at 03/19/2024 1259 Last data filed at 03/19/2024 1051 Gross per 24 hour  Intake 220 ml  Output 265 ml  Net -45 ml     Exam: General: The patient is in the ICU bed in no apparent distress   Lab Results: @LABTEST2 @ Micro Results: Recent Results (from the past 240 hours)  Urine Culture (for pregnant, neutropenic or urologic patients or patients with an indwelling urinary catheter)     Status: None   Collection Time: 03/14/24  3:26 PM   Specimen: Urine, Catheterized  Result Value Ref Range Status   Specimen Description   Final    URINE, CATHETERIZED Performed at [facility], [address] [place], [place] [address]    Special Requests   Final    NONE Performed at [facility], [address], [place], [place] [address]    Culture   Final    NO GROWTH Performed at [facility], [address], [place], [place] [address]    Report Status 03/16/2024 FINAL  Final  MRSA Next Gen by PCR, Nasal     Status: None   Collection Time: 03/15/24  5:08 PM   Specimen: Nasal Mucosa; Nasal Swab  Result Value Ref Range Status   MRSA by PCR Next Gen  NOT DETECTED NOT DETECTED Final    Comment: (NOTE) The GeneXpert MRSA Assay (FDA approved for NASAL specimens only), is one component of a comprehensive MRSA colonization surveillance program. It is not intended to diagnose MRSA infection nor to guide or monitor treatment for MRSA infections. Test performance is not FDA approved in patients less than 67 years old. Performed at [facility], [address] [place], [place] [address]   Culture, blood (Routine X 2) w Reflex to ID Panel     Status: None (Preliminary result)   Collection Time: 03/16/24 12:14 PM   Specimen: BLOOD LEFT HAND  Result Value Ref Range Status   Specimen Description BLOOD LEFT HAND  Final   Special Requests   Final    BOTTLES DRAWN AEROBIC AND ANAEROBIC Blood Culture adequate volume   Culture   Final    NO GROWTH 3 DAYS Performed at [facility], [address] [place], [place] [address]    Report Status PENDING  Incomplete  Culture, blood (Routine X 2) w Reflex to ID Panel     Status: None (Preliminary result)   Collection Time: 03/16/24 12:21 PM   Specimen:  BLOOD LEFT ARM  Result Value Ref Range Status   Specimen Description BLOOD LEFT ARM  Final   Special Requests   Final    BOTTLES DRAWN AEROBIC AND ANAEROBIC Blood Culture adequate volume   Culture   Final    NO GROWTH 3 DAYS Performed at [facility], [address], [place], [place] [address]    Report Status PENDING  Incomplete   Studies/Results: US  Abdomen Limited RUQ (LIVER/GB) Result Date: 03/17/2024 CLINICAL DATA:  6194 Acute cholecystitis 575.0.ICD-9-CM EXAM: ULTRASOUND ABDOMEN LIMITED RIGHT UPPER QUADRANT COMPARISON:  03/17/2024, 03/14/2024, 02/27/2024 FINDINGS: Gallbladder: Similar diffuse dilation of the gallbladder without shadowing gallstones. Circumferential wall thickening and gallbladder wall edema noted. No sonographic Murphy's sign noted by sonographer. Common bile duct: Obscured by overlying bowel gas.  Liver: Normal echogenicity. No focal lesion identified. Extensive pneumobilia again noted. Portal vein is patent on color Doppler imaging with normal direction of blood flow towards the liver. Other: Small volume perihepatic ascites again noted IMPRESSION: 1. Similar fluid-filled dilation of the gallbladder. Circumferential wall thickening and gallbladder wall edema, which in the absence of a sonographic Murphy's sign and gallstones, may be related to upper abdominal inflammation, acute hepatitis, or volume overload from either CHF or renal failure. 2. Extensive pneumobilia again noted. 3. Small volume perihepatic ascites.  Right pleural effusion. Electronically Signed   By: Jared Myers M.D.   On: 03/17/2024 18:36   CT ABDOMEN PELVIS WO CONTRAST Result Date: 03/17/2024 EXAM: CT ABDOMEN AND PELVIS WITHOUT CONTRAST 03/17/2024 04:53:03 PM TECHNIQUE: CT of the abdomen and pelvis was performed without the administration of intravenous contrast. Multiplanar reformatted images are provided for review. Automated exposure control, iterative reconstruction, and/or weight-based adjustment of the mA/kV was utilized to reduce the radiation dose to as low as reasonably achievable. Multifactorial degradation, including lack of IV contrast and patient arm position, not raised above the head. COMPARISON: MRI of 03/14/2024. CT of 03/14/2024. CLINICAL HISTORY: Bowel obstruction suspected. History of pancreatic and colon carcinoma. * Tracking Code: BO * FINDINGS: LOWER CHEST: Bilateral lower lobe bullous emphysema with new medial bilateral lower lobe consolidation. New small bilateral pleural effusions. Right ventricular central line. LIVER: Limited evaluation of the liver. New pneumobilia suggesting stent patency in the setting of common duct stent. GALLBLADDER AND BILE DUCTS: Persistent moderate gallbladder distention with suggestion of pericholecystic edema including image 37/10. The common duct stent terminates more  distally, in the inferior genu of the duodenum. SPLEEN: No acute abnormality. PANCREAS: The pancreatic head/neck lesion is poorly evaluated but grossly similar at 2.8 x 2.2 cm on image 41/3. ADRENAL GLANDS: No acute abnormality. KIDNEYS, URETERS AND BLADDER: Contrast remains within the kidneys from prior contrast-enhanced exams. Left renal cortical scarring in the lower pole. Right renal cyst of up to 2.5 cm. No follow up indicated. No hydronephrosis. Foley catheter decompresses the urinary bladder. GI AND BOWEL: Placement of a nasogastric tube which terminates in the gastric body. resolved gastric distention. Normal small bowel caliber. Appendix not visualized. There is no bowel obstruction. PERITONEUM AND RETROPERITONEUM: Increase in small volume abdominal pelvic ascites. No free intraperitoneal air. VASCULATURE: Aorta is normal in caliber. Aortic atherosclerosis. LYMPH NODES: Prominent but not pathologically sized abdominal retroperitoneal nodes are likely reactive. REPRODUCTIVE ORGANS: No acute abnormality. BONES AND SOFT TISSUES: Degenerative changes of both hips. Increased diffuse anasarca. Small bore right sided femoral venous catheter. No acute osseous abnormality. No focal soft tissue abnormality. IMPRESSION: 1. Multifactorial degradation, including lack of IV contrast and patient arm position. 2.  Development of fluid overload, with new bilateral pleural effusions, increased anasarca, and ascites. 3. No evidence of bowel obstruction. 4. Repositioning or replacement of common bile duct stent with new pneumobilia indicative of stent patency. Persistent gallbladder distention with suggestion of pericholecystic edema. Cannot exclude acute cholecystitis. Consider right upper quadrant ultrasound. 5. New bibasilar airspace disease, suspicious for infection or aspiration. 6. Placement of a nasogastric tube with resolution of gastric distention. Electronically signed by: Rockey Kilts MD 03/17/2024 05:38 PM EST RP  Workstation: [id number]   Medications: I have reviewed the patient's current medications. Scheduled Meds:  Chlorhexidine  Gluconate Cloth  6 each Topical Daily   feeding supplement  237 mL Oral BID BM   [START ON 03/20/2024] feeding supplement (PROSource TF20)  60 mL Per Tube Daily   free water  30 mL Per Tube Q4H   hydrocortisone sod succinate (SOLU-CORTEF) inj  50 mg Intravenous Q8H   midodrine   10 mg Per Tube TID WC   pantoprazole  (PROTONIX ) IV  40 mg Intravenous Daily   sodium chloride  flush  10-40 mL Intracatheter Q12H   [START ON 03/20/2024] thiamine  100 mg Per Tube Daily   Continuous Infusions:  [START ON 03/20/2024] ceFEPime  (MAXIPIME ) IV     feeding supplement (VITAL 1.5 CAL)     metronidazole  500 mg (03/19/24 0543)   PRN Meds:.acetaminophen  **OR** acetaminophen , magnesium  hydroxide, mineral oil-hydrophilic petrolatum, morphine  injection, ondansetron  **OR** ondansetron  (ZOFRAN ) IV, sodium chloride  flush, traZODone   Assessment: Principal Problem:   Obstructive jaundice (HCC) Active Problems:   Hypokalemia   BPH (benign prostatic hyperplasia)   GERD without esophagitis   Protein-calorie malnutrition, severe   Hyperbilirubinemia    Plan: 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  The patient had cholangitis with a stent placed with the white cell count going from mid 20s down to 13.9 but went back to 20.4 yesterday.  The white cell count has come down again to 14.9.  The patient has had his INR and LFTs sent off again today and if his INR continues to trend down then we may replace the plastic stents with a metal stent.  I have discussed this with his family and they agree with the plan.   LOS: 5 days   Jared Copping, MD.FACG 03/19/2024, 12:59 PM Pager [phone] 7am-5pm  Check AMION for 5pm -7am coverage and on weekends

## 2024-03-20 ENCOUNTER — Inpatient Hospital Stay

## 2024-03-20 ENCOUNTER — Inpatient Hospital Stay: Admitting: Hospice and Palliative Medicine

## 2024-03-20 DIAGNOSIS — Z66 Do not resuscitate: Secondary | ICD-10-CM

## 2024-03-20 DIAGNOSIS — C189 Malignant neoplasm of colon, unspecified: Secondary | ICD-10-CM

## 2024-03-20 DIAGNOSIS — Z7189 Other specified counseling: Secondary | ICD-10-CM

## 2024-03-20 DIAGNOSIS — Z515 Encounter for palliative care: Secondary | ICD-10-CM

## 2024-03-20 DIAGNOSIS — C7800 Secondary malignant neoplasm of unspecified lung: Secondary | ICD-10-CM

## 2024-03-20 DIAGNOSIS — K831 Obstruction of bile duct: Secondary | ICD-10-CM | POA: Diagnosis not present

## 2024-03-20 LAB — CBC
HCT: 29.2 % — ABNORMAL LOW (ref 39.0–52.0)
Hemoglobin: 10.3 g/dL — ABNORMAL LOW (ref 13.0–17.0)
MCH: 31.4 pg (ref 26.0–34.0)
MCHC: 35.3 g/dL (ref 30.0–36.0)
MCV: 89 fL (ref 80.0–100.0)
Platelets: 139 K/uL — ABNORMAL LOW (ref 150–400)
RBC: 3.28 MIL/uL — ABNORMAL LOW (ref 4.22–5.81)
RDW: 20 % — ABNORMAL HIGH (ref 11.5–15.5)
WBC: 15.1 K/uL — ABNORMAL HIGH (ref 4.0–10.5)
nRBC: 1.3 % — ABNORMAL HIGH (ref 0.0–0.2)

## 2024-03-20 LAB — BASIC METABOLIC PANEL WITH GFR
Anion gap: 13 (ref 5–15)
BUN: 60 mg/dL — ABNORMAL HIGH (ref 8–23)
CO2: 13 mmol/L — ABNORMAL LOW (ref 22–32)
Calcium: 9.2 mg/dL (ref 8.9–10.3)
Chloride: 112 mmol/L — ABNORMAL HIGH (ref 98–111)
Creatinine, Ser: 2.87 mg/dL — ABNORMAL HIGH (ref 0.61–1.24)
GFR, Estimated: 21 mL/min — ABNORMAL LOW (ref >60)
Glucose, Bld: 147 mg/dL — ABNORMAL HIGH (ref 70–99)
Potassium: 3.5 mmol/L (ref 3.5–5.1)
Sodium: 138 mmol/L (ref 135–145)

## 2024-03-20 LAB — PROTIME-INR
INR: 2 — ABNORMAL HIGH (ref 0.8–1.2)
Prothrombin Time: 23.6 s — ABNORMAL HIGH (ref 11.4–15.2)

## 2024-03-20 LAB — GLUCOSE, CAPILLARY
Glucose-Capillary: 127 mg/dL — ABNORMAL HIGH (ref 70–99)
Glucose-Capillary: 160 mg/dL — ABNORMAL HIGH (ref 70–99)
Glucose-Capillary: 146 mg/dL — ABNORMAL HIGH (ref 70–99)
Glucose-Capillary: 139 mg/dL — ABNORMAL HIGH (ref 70–99)

## 2024-03-20 LAB — HEPATIC FUNCTION PANEL
ALT: 58 U/L — ABNORMAL HIGH (ref 0–44)
AST: 103 U/L — ABNORMAL HIGH (ref 15–41)
Albumin: 2.1 g/dL — ABNORMAL LOW (ref 3.5–5.0)
Alkaline Phosphatase: 305 U/L — ABNORMAL HIGH (ref 38–126)
Bilirubin, Direct: 8.1 mg/dL — ABNORMAL HIGH (ref 0.0–0.2)
Indirect Bilirubin: 7.3 mg/dL — ABNORMAL HIGH (ref 0.3–0.9)
Total Bilirubin: 15.4 mg/dL — ABNORMAL HIGH (ref 0.0–1.2)
Total Protein: 5.1 g/dL — ABNORMAL LOW (ref 6.5–8.1)

## 2024-03-20 LAB — MAGNESIUM: Magnesium: 2.5 mg/dL — ABNORMAL HIGH (ref 1.7–2.4)

## 2024-03-20 LAB — PHOSPHORUS: Phosphorus: 4.9 mg/dL — ABNORMAL HIGH (ref 2.5–4.6)

## 2024-03-20 MED ORDER — HYDROMORPHONE HCL 1 MG/ML IJ SOLN: 0.5000 mg | INTRAMUSCULAR | Status: DC | PRN

## 2024-03-20 MED ORDER — SODIUM CHLORIDE 0.9 % IV SOLN: 2.0000 g | INTRAVENOUS | Status: DC

## 2024-03-20 MED ORDER — BIOTENE DRY MOUTH MT LIQD
15.0000 mL | Freq: Three times a day (TID) | OROMUCOSAL | Status: DC
  Administered 2024-03-20: 15 mL via TOPICAL

## 2024-03-20 MED ORDER — POLYVINYL ALCOHOL 1.4 % OP SOLN: 1.0000 [drp] | Freq: Four times a day (QID) | OPHTHALMIC | Status: DC | PRN

## 2024-03-20 NOTE — Consult Note (Signed)
 Consultation Note Date: 03/20/2024   Patient Name: Jared Gutierrez  DOB: Nov 28, 1940  MRN: [id number]  Age / Sex: 83 y.o., male  PCP: Sadie Manna, MD Referring Physician: Parris Manna, MD  Reason for Consultation: Establishing goals of care  HPI/Patient Profile: 83 y.o. 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 Diagnosed with septic shock with multiorgan failure secondary to acute ascending cholangitis.  Found to have recurrent malignant biliary obstruction secondary to colorectal cancer.  Also with AKI secondary to septic shock.   PMT consulted to discuss goals of care.  Clinical Assessment and Goals of Care: I have reviewed medical records including EPIC notes, labs and imaging, received report from Dr. Parris and RN, assessed the patient and then met with patient's spouse and daughter to discuss diagnosis prognosis, GOC, EOL wishes, disposition and options.  I introduced Palliative Medicine as specialized medical care for people living with serious illness. It focuses on providing relief from the symptoms and stress of a serious illness. The goal is to improve quality of life for both the patient and the family.  We discussed a brief life review of the patient.  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  They speak of him is good with his hands.  He has been living at home with his wife but she also serves as a caregiver for her mother and so his daughter Odella helps with his caregiving needs during the week.  As far as functional and nutritional status wife and daughter share of a decline.  They show me a picture of him from several months ago showing a much  heavier man than the 1 I see in the bed today.  They speak of his slow decline recently.  They tell me he was very sedentary and spent most of his time sleeping but would use a walker to move around the house.  He tells me he was eating about 1 meal a day.  He has been on a break from chemo since June.   We discussed patient's current illness and what it means in the larger context of patient's on-going co-morbidities.  Natural disease trajectory and expectations at EOL were discussed.  We discussed his metastatic cancer along with current infection and limited options moving forward.  They understand from GI that he is not a good candidate for stent exchange due to his metastatic cancer.  I attempted to elicit values and goals of care important to the patient.  Wife and daughter share patient would not be interested in aggressive medical care given current situation and lack of treatment options.  The difference between aggressive medical intervention and comfort care was considered in light of the patient's goals of care.  Wife and daughter agreed to transition to full comfort measures.  We review transition of care to focus solely on him being comfortable, clean, warm, and dry.  We discussed freeing him from aggressive medical interventions including the feeding tube.  We discussed he can take sips and bites as he is able but as he nears end-of-life he likely will not desire much as this is part of natural progression of end-of-life.  They understand this.  We discussed option of hospice facility.  Discussed philosophy of hospice care and type of care provided.  Family understands this and agrees to hospice facility referral.  Questions and concerns were addressed. The family was encouraged to call with questions or concerns.  Primary Decision Maker NEXT OF KIN/HCPOA/wife -Rock    SUMMARY OF RECOMMENDATIONS   -Transition to comfort measures only, DC feeding tube - comfort meds available as  needed - family prefers hospice facility in [place]  Code Status/Advance Care Planning: DNR  Discharge Planning: Hospice facility      Primary Diagnoses: Present on Admission:  Obstructive jaundice (HCC)  BPH (benign prostatic hyperplasia)  Hypokalemia   I have reviewed the medical record, interviewed the patient and family, and examined the patient. The following aspects are pertinent.  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   Chicken pox    Colon cancer (HCC)    History of kidney stones    Hypertension    Lipoma of colon    Nephrolithiasis    Nephrolithiasis    Obesity    Shingles    Tubular adenoma of colon    multiple fragments   Social History   Socioeconomic History   Marital status: Married    Spouse name: Not on file   Number of children: Not on file   Years of education: Not on file   Highest education level: Not on file  Occupational History   Not on file  Tobacco Use   Smoking status: Never   Smokeless tobacco: Never  Vaping Use   Vaping status: Never Used  Substance and Sexual Activity   Alcohol use: Not Currently    Comment: socially    Drug use: No   Sexual activity: Not on file  Other Topics Concern   Not on file  Social History Narrative   Not on file   Social Drivers of Health   Financial Resource Strain: Not on file  Food Insecurity: Patient Unable To Answer (03/16/2024)   Hunger Vital Sign    Worried About Running Out of Food in the Last Year: Patient unable to answer    Ran Out of Food in the Last Year: Patient unable to answer  Transportation Needs: Patient Unable To Answer (03/16/2024)   PRAPARE - Transportation    Lack of Transportation (Medical): Patient unable to answer    Lack of Transportation (Non-Medical): Patient unable to answer  Physical Activity: Not on file  Stress: Not on file  Social Connections: Patient Unable To  Answer (03/16/2024)   Social Connection and Isolation Panel    Frequency of Communication with Friends and Family: Patient unable to answer    Frequency of Social Gatherings with Friends and Family: Patient unable to answer    Attends Religious Services: Patient unable to answer    Active Member of Clubs or Organizations: Patient unable to answer    Attends Banker Meetings: Patient unable to answer    Marital Status: Patient unable to answer   Family History  Problem Relation Age of Onset   Cancer Mother    Breast cancer Mother    COPD Father    Scheduled Meds:  antiseptic oral rinse  15 mL Topical TID   sodium chloride  flush  10-40 mL Intracatheter Q12H   Continuous Infusions: PRN Meds:.acetaminophen  **OR** acetaminophen , artificial tears, HYDROmorphone (DILAUDID) injection, magnesium  hydroxide, mineral oil-hydrophilic petrolatum, morphine  injection, ondansetron  **OR** ondansetron  (ZOFRAN ) IV, sodium chloride  flush, traZODone No Known Allergies  Review of Systems  Unable to perform ROS: Mental status change    Physical Exam Constitutional:      General: He is not in acute distress.    Appearance: He is ill-appearing.     Comments: Minimally interactive  Cardiovascular:     Rate and Rhythm: Rhythm irregular.  Pulmonary:     Effort: Pulmonary effort is normal.  Skin:    General: Skin is warm and dry.     Vital Signs: BP 119/69   Pulse (!) 57   Temp (!) 97.3 F (36.3 C) (Oral)   Resp 12   Ht 5' 8 (1.727 m)   Wt 93.1 kg   SpO2 96%   BMI 31.21 kg/m  Pain Scale: Faces POSS *See Group Information*: 1-Acceptable,Awake and alert Pain Score: 0-No pain   SpO2: SpO2: 96 % O2 Device:SpO2: 96 % O2 Flow Rate: .   IO: Intake/output summary:  Intake/Output Summary (Last 24 hours) at 03/20/2024 1132 Last data filed at 03/20/2024 9062 Gross per 24 hour  Intake 595.83 ml  Output 50 ml  Net 545.83 ml    LBM: Last BM Date : 03/18/24 Baseline Weight: Weight:  92.1 kg Most recent weight: Weight: 93.1 kg      *Please note that this is a verbal dictation therefore any spelling or grammatical errors are due to the [employer] One system interpretation.   Time Total: 80 minutes Time spent includes: Detailed review of medical records (labs, imaging, vital signs), medically appropriate exam, discussion with treatment team, counseling and educating patient, family and/or staff, documenting clinical information, medication management and coordination of care.    Tobey Jama Barnacle, DNP, AGNP-C Palliative Medicine Team [phone] Pager: [phone]

## 2024-03-20 NOTE — Progress Notes (Signed)
 [facility] Liaison Note  Received request from Transitions of Care Manger for family interest in the Hospice Home.  Eligibility has been confirmed.  Met/Spoke with spouse and daughter  to confirm interest and explain services.  Family agreeable to transfer today. Transitions of Care manager aware.  RN please call report to [phone] [facility]) prior to patient leaving the unit.  Please send signed and completed DNR with patient at discharge.  Thank you  Marinell Nova,  [facility] Liaison 336 [phone]

## 2024-03-20 NOTE — Progress Notes (Signed)
 NAME:  Jared Gutierrez, MRN:  [id number], DOB:  March 18, 1941, LOS: 6 ADMISSION DATE:  03/14/2024, CONSULTATION DATE:  03/16/2024 REFERRING MD:  Madison Peaches CHIEF COMPLAINT:  Sepsis with shock   History of Present Illness:  Mr. Jared Gutierrez is an 83 y.o. 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 He has not had a bowel movement within the previous 3-4 days and now has developed worsening abdominal distention, nausea and vomiting.  ED Course: Initial vital signs showed HR of 83 beats/minute, BP 105/38mm Hg, the RR 20 breaths/minute, 100 %O2 Sat on RA and Temp of 98.68F (36.7C).      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 11/2: Remained hypotensive post-op requiring pressor. PCCM consulted 11/3: No overnight events. Off of pressors. Remains on abx, midodrine  and hydrocortisone. GI following. 03/18/24- patient s/p GI eval- recommendation for perc biliary drain due to increased pancreatic mass effect.  Appears more lethargic today.  Tbili is worsening from 14 to 18.  No UOP today with bladder scan <30cc.  RUQ US  was inconclusive.  Surgery consult and med/onc consult today for additional eval regarding acute cholecystitis, and cancer options. 03/19/24- patient is more awake, UOP is  better, possible perc chole drain today. Mild aki overnight will dc his lasix  today.  Overall prognosis is poor 03/20/24- patient remains critically ill with advanced cancer.  No additional surgical intervention is offered from GI or surgery or IR.  His prognosis remains poor with advanced age and multiorgan dysfunction and recommendation is for home/hospice.  Interim History / Subjective:  See above listed under Significant Hospital Events.  Micro Data: 11/1: Blood Cultures x 2 >> 11/1: MRSA PCR >> negative   Antimicrobials: Vancomycin  11/2 >> Cefepime  11/2 >> Metronidazole  11/2 >> Ceftriaxone  11/1, stopped 11/2   Objective    Blood pressure 119/69, pulse (!) 57, temperature (!) 97.3 F (36.3 C), temperature source Oral, resp. rate 12, height 5' 8 (1.727 m), weight 93.1 kg, SpO2 96%.        Intake/Output Summary (Last 24 hours) at 03/20/2024 0934 Last data filed at 03/20/2024 9346 Gross per 24 hour  Intake 565.83 ml  Output 50 ml  Net 515.83 ml   Filed Weights   03/15/24 1707 03/19/24 0500 03/20/24 0500  Weight: 89.2 kg 96.7 kg 93.1 kg    Examination: General: ill appearing male, lying in bed in NAD HENT: atraumatic, normocephalic, supple, no JVD, scleral icterus Lungs: clear to auscultation bilaterally, no wheezes/crackles/rhonchi Cardiovascular: RRR, S1 S2, no m/r/g Abdomen: mildly soft, non-distended, no rebound/guarding, hypoactive bowel sounds x 4 Extremities: warm, dry, intact, radial pulses 2+, distal pulses 1+, UE edema 1+, LE edema 2+, jaundice Neuro: alert to voice and physical stimuli, oriented to self and place only, follows commands, moves all extremities GU: indwelling foley catheter draining minimal clear yellow urine  Resolved problem list   Assessment and Plan   #  Septic Shock with Multiorgan Failure secondary to Acute Ascending Cholangitis - Trend WBC and monitor fever curve - Vasopressors to maintain MAP goal > 65 ~weaned off of levophed +  vasopressin - Continue midodrine  and hydrocortisone to assist with BP - A line in place - IV fluid resuscitation as able; will stop D5LR - Cultures pending - ABX: continue Cefepime  + Metronidazole  + Vanc - Narrow ABX per culture and sensitivities - Trend lactic acid; trending down, now at 2.3   #Acute Cholangitis #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 Progressive intrahepatic and extrahepatic biliary duct dilation despite interval placement of bile duct stent. The downstream aspect of the biliary stent projects near the ampulla not clearly within the lumen of the duodenum, and may not provide adequate decompression of the biliary tree. 2. Marked gallbladder distension without evidence of cholelithiasis or acute cholecystitis. 3. Stable complex cystic and solid mass within the pancreatic body, with continued extrinsic compression upon the portal vein, upstream pancreatic duct dilation, and atrophy of the pancreatic body and tail. This has been previously evaluated by MRI. 4. Distended fluid-filled stomach, which may require decompression with enteric catheter. No evidence of bowel obstruction or ileus. 5. Trace ascites. 6.  Aortic Atherosclerosis 10/31 MRCP:  - Constipation protocol - Antiemetics prn: Zofran  - GI following, appreciate input - Continue ABX - Repeat CT Abd/Pelvis today   #Right Lowe Lobe Bleb - Supplemental oxygen to maintain sats >92% - RT interventions with caution give risk for pneumothorax - High risk for pneumothorax requiring chest tube   #Acute Kidney Injury secondary to Septic Shock #Anion Gap Metabolic Acidosis #Hypokalemia - Strict I/O's - Trend BMP and monitor renal function - Creatinine improving; 1.32 this am (Baseline 0.3-0.78) - Potassium 3.3, replace per pharmacy - Avoid nephrotoxins as able - Ensure renal perfusion - Electrolyte replacement protocol; pharmacy to  assist with replacement - May need to bladder scan if UO doesn't improve   #Small Bowel Obstruction #Transaminitis #Hyperbilirubinemia #Hypoalbuminemia #Trace Ascites #GERD - Monitor hepatic function - Tbili trending down, 14.0 this am - AST/ALT: 109/59 - Avoid hepatotoxins as able - GI Prophylaxis: Protonix  40mg  - Feeds: NG tube in place, continue to suction for SBO - Lipase pending to r/o pancreatitis   #Thrombocytopenia #Anemia #Biliary Obstruction induced Coagulopathy - Trend CBC - Monitor PT/INR, INR 2.5 this am, trending down - Previously received 1 unit FFP and Vitamin K - Transfuse if Hgb < 7; 6.8 this am, 1 unit pRBCs - Monitor for s/sx of bleeding - DVT Prophylaxis: SCDs - DIC workup (Fibrinogen) and smear pending   #Hypoglycemia - ICU hypo/hyperglycemia protocol - CBG Q4h - Glucose goal range: 140-180 - Discontinue D5LR given BG normalized and pt with significant edema    Labs   CBC: Recent Labs  Lab 03/16/24 0221 03/17/24 0410 03/17/24 1610 03/18/24 0406 03/19/24 0432 03/20/24 0340  WBC 23.4* 13.9*  --  20.4* 14.9* 15.1*  NEUTROABS  --  13.1*  --   --   --   --   HGB 8.1* 6.8* 8.8* 9.7* 9.6* 10.3*  HCT 23.5* 19.4* 25.1* 27.1* 27.4* 29.2*  MCV 94.0 91.9  --  86.9 87.8 89.0  PLT 143* 110*  --  138* 135* 139*    Basic Metabolic Panel: Recent Labs  Lab 03/17/24 0410 03/17/24 1723 03/17/24 2253 03/18/24 0406 03/19/24 0432 03/19/24 1320 03/20/24 0340  NA 133*  --   --  135 137 137 138  K  3.3*   < > 3.7 3.5 3.7 3.7 3.5  CL 109  --   --  110 109 110 112*  CO2 16*  --   --  15* 13* 14* 13*  GLUCOSE 154*  --   --  91 93 97 147*  BUN 21  --   --  31* 45* 47* 60*  CREATININE 1.32*  --   --  1.88* 2.48* 2.64* 2.87*  CALCIUM  8.1*  --   --  8.9 8.8* 9.0 9.2  MG 2.1  --  2.2 2.2 2.3  --  2.5*  PHOS 3.0  --   --  3.6 4.1  --  4.9*   < > = values in this interval not displayed.   GFR: Estimated Creatinine Clearance: 21.6 mL/min (A) (by C-G  formula based on SCr of 2.87 mg/dL (H)). Recent Labs  Lab 03/16/24 0813 03/16/24 1214 03/16/24 1609 03/16/24 2045 03/17/24 0410 03/18/24 0406 03/19/24 0432 03/20/24 0340  WBC  --   --   --   --  13.9* 20.4* 14.9* 15.1*  LATICACIDVEN 2.7* 2.7* 2.6* 2.3*  --   --   --   --     Liver Function Tests: Recent Labs  Lab 03/16/24 0407 03/17/24 0410 03/18/24 0406 03/19/24 1320 03/20/24 0340  AST 225* 109* 113* 113* 103*  ALT 86* 59* 63* 60* 58*  ALKPHOS 162* 110 166* 242* 305*  BILITOT 15.4* 14.0* 18.3* 15.8* 15.4*  PROT 4.6* 4.4* 5.2* 5.1* 5.1*  ALBUMIN 1.7* 2.0* 2.3* 2.0* 2.1*   Recent Labs  Lab 03/14/24 1313 03/17/24 1401  LIPASE 23 19   Recent Labs  Lab 03/18/24 0927  AMMONIA 52*    ABG    Component Value Date/Time   O2SAT 85 03/16/2024 0813     Coagulation Profile: Recent Labs  Lab 03/16/24 1609 03/17/24 0410 03/18/24 0406 03/19/24 1238 03/20/24 0520  INR 3.0* 2.5* 1.8* 1.8* 2.0*    Cardiac Enzymes: No results for input(s): CKTOTAL, CKMB, CKMBINDEX, TROPONINI in the last 168 hours.  HbA1C: Hemoglobin A1C  Date/Time Value Ref Range Status  05/22/2011 06:49 AM 5.3 4.2 - 6.3 % Final    Comment:    The American Diabetes Association recommends that a primary goal of therapy should be <7% and that physicians should reevaluate the treatment regimen in patients with HbA1c values consistently >8%.    Hgb A1c MFr Bld  Date/Time Value Ref Range Status  04/19/2021 08:32 AM 5.3 4.8 - 5.6 % Final    Comment:    (NOTE)         Prediabetes: 5.7 - 6.4         Diabetes: >6.4         Glycemic control for adults with diabetes: <7.0     CBG: Recent Labs  Lab 03/19/24 1130 03/19/24 1916 03/19/24 2306 03/20/24 0328 03/20/24 0749  GLUCAP 92 96 123* 127* 160*    Review of Systems:   Review of Systems  Constitutional:  Negative for chills and fever.  Respiratory:  Negative for cough, shortness of breath and wheezing.   Cardiovascular:   Negative for chest pain and palpitations.  Gastrointestinal:  Positive for nausea. Negative for vomiting.  Genitourinary:  Negative for dysuria.  Neurological:  Negative for dizziness and headaches.     Past Medical History:  He,  has a past medical history of Arthritis, Cancer (HCC), Cavitary lesion of lung, Chicken pox, Colon cancer (HCC), History of kidney stones, Hypertension, Lipoma of  colon, Nephrolithiasis, Nephrolithiasis, Obesity, Shingles, and Tubular adenoma of colon.   Surgical History:   Past Surgical History:  Procedure Laterality Date   BILIARY STENT PLACEMENT  03/04/2024   Procedure: INSERTION, STENT, BILE DUCT;  Surgeon: Jinny Carmine, MD;  Location: [facility] ENDOSCOPY;  Service: Endoscopy;;   BILIARY STENT PLACEMENT  03/15/2024   Procedure: INSERTION, STENT, BILE DUCT;  Surgeon: Jinny Carmine, MD;  Location: [facility] ENDOSCOPY;  Service: Endoscopy;;   COLON SURGERY     COLONOSCOPY N/A 10/02/2014   Procedure: COLONOSCOPY;  Surgeon: Donnice Vaughn Manes, MD;  Location: [facility] ENDOSCOPY;  Service: Endoscopy;  Laterality: N/A;   COLONOSCOPY WITH PROPOFOL  N/A 02/16/2017   Procedure: COLONOSCOPY WITH PROPOFOL ;  Surgeon: Viktoria Lamar DASEN, MD;  Location: [facility] ENDOSCOPY;  Service: Endoscopy;  Laterality: N/A;   COLONOSCOPY WITH PROPOFOL  N/A 10/05/2020   Procedure: COLONOSCOPY WITH PROPOFOL ;  Surgeon: Maryruth Ole DASEN, MD;  Location: [facility] ENDOSCOPY;  Service: Endoscopy;  Laterality: N/A;   ERCP N/A 03/04/2024   Procedure: ERCP, WITH INTERVENTION IF INDICATED;  Surgeon: Jinny Carmine, MD;  Location: [facility] ENDOSCOPY;  Service: Endoscopy;  Laterality: N/A;   ERCP N/A 03/15/2024   Procedure: ERCP, WITH INTERVENTION IF INDICATED;  Surgeon: Jinny Carmine, MD;  Location: [facility] ENDOSCOPY;  Service: Endoscopy;  Laterality: N/A;   ESOPHAGOGASTRODUODENOSCOPY N/A 12/31/2023   Procedure: EGD (ESOPHAGOGASTRODUODENOSCOPY);  Surgeon: Wilhelmenia Aloha Raddle., MD;  Location: [facility];  Service:  Gastroenterology;  Laterality: N/A;   EUS N/A 04/17/2019   Procedure: FULL UPPER ENDOSCOPIC ULTRASOUND (EUS) RADIAL;  Surgeon: Queenie Asberry LABOR, MD;  Location: [facility] ENDOSCOPY;  Service: Gastroenterology;  Laterality: N/A;   EUS N/A 12/31/2023   Procedure: ULTRASOUND, UPPER GI TRACT, ENDOSCOPIC;  Surgeon: Wilhelmenia Aloha Raddle., MD;  Location: [facility] ENDOSCOPY;  Service: Gastroenterology;  Laterality: N/A;   FINE NEEDLE ASPIRATION  12/31/2023   Procedure: FINE NEEDLE ASPIRATION;  Surgeon: Wilhelmenia Aloha Raddle., MD;  Location: [facility] ENDOSCOPY;  Service: Gastroenterology;;   KIDNEY STONE SURGERY     PARTIAL COLECTOMY  10/17/2013   PORTACATH PLACEMENT Right 06/13/2019   Procedure: INSERTION PORT-A-CATH;  Surgeon: Tye Millet, DO;  Location: [facility] ORS;  Service: General;  Laterality: Right;   STENT REMOVAL  03/15/2024   Procedure: STENT REMOVAL;  Surgeon: Jinny Carmine, MD;  Location: [facility] ENDOSCOPY;  Service: Endoscopy;;     Social History:   reports that he has never smoked. He has never used smokeless tobacco. He reports that he does not currently use alcohol. He reports that he does not use drugs.   Family History:  His family history includes Breast cancer in his mother; COPD in his father; Cancer in his mother.   Allergies No Known Allergies   Home Medications  Prior to Admission medications   Medication Sig Start Date End Date Taking? Authorizing Provider  cholestyramine (QUESTRAN) 4 g packet Take 1 packet (4 g total) by mouth 3 (three) times daily for 14 days. 03/06/24 03/20/24 Yes Agbata, Tochukwu, MD  cyanocobalamin (VITAMIN B12) 500 MCG tablet Take by mouth. 07/26/23 07/25/24 Yes [provider]  feeding supplement (ENSURE PLUS HIGH PROTEIN) LIQD Take 237 mLs by mouth 2 (two) times daily between meals. 03/06/24 04/05/24 Yes Agbata, Tochukwu, MD  furosemide  (LASIX ) 20 MG tablet Take 1 tablet (20 mg total) by mouth daily as needed for edema. 03/12/24  Yes Babara Call, MD  omeprazole   (PRILOSEC) 20 MG capsule Take 20 mg by mouth daily. 12/25/23  Yes [provider]  ondansetron  (ZOFRAN ) 8 MG tablet Take 1 tablet (  8 mg total) by mouth 2 (two) times daily as needed for refractory nausea / vomiting. Start on day 3 after chemotherapy. 11/16/20  Yes Babara Call, MD  pantoprazole  (PROTONIX ) 40 MG tablet Take 1 tablet (40 mg total) by mouth every morning. Take in the morning on an empty stomach 12/07/23 03/15/24 Yes Sreenath, Calvin NOVAK, MD  potassium chloride  SA (KLOR-CON  M) 20 MEQ tablet Take 1 tablet (20 mEq total) by mouth daily. 03/12/24  Yes Babara Call, MD  Probiotic Product (PROBIOTIC DAILY PO) Take 1 tablet by mouth daily.   Yes [provider]  tamsulosin  (FLOMAX ) 0.4 MG CAPS capsule Take 0.4 mg by mouth daily after supper. 03/03/24  Yes [provider]  lidocaine -prilocaine  (EMLA ) cream Apply small amount to port and cover with saran wrap 1-2 hours prior to port access Patient not taking: Reported on 03/15/2024 08/27/23   Babara Call, MD  loratadine (CLARITIN) 10 MG tablet Take 10 mg by mouth daily. Patient not taking: Reported on 03/15/2024    [provider]     Critical care provider statement:   Total critical care time: 33 minutes   Performed by: Parris MD   Critical care time was exclusive of separately billable procedures and treating other patients.   Critical care was necessary to treat or prevent imminent or life-threatening deterioration.   Critical care was time spent personally by me on the following activities: development of treatment plan with patient and/or surrogate as well as nursing, discussions with consultants, evaluation of patient's response to treatment, examination of patient, obtaining history from patient or surrogate, ordering and performing treatments and interventions, ordering and review of laboratory studies, ordering and review of radiographic studies, pulse oximetry and re-evaluation of patient's condition.     Kiren Mcisaac, M.D.  Pulmonary & Critical Care Medicine

## 2024-03-20 NOTE — Plan of Care (Signed)
  Problem: Pain Managment: Goal: General experience of comfort will improve and/or be controlled Outcome: Progressing   Problem: Safety: Goal: Ability to remain free from injury will improve Outcome: Progressing   Problem: Skin Integrity: Goal: Risk for impaired skin integrity will decrease Outcome: Progressing

## 2024-03-20 NOTE — Plan of Care (Signed)
  Problem: Clinical Measurements: Goal: Signs and symptoms of infection will decrease Outcome: Progressing   Problem: Respiratory: Goal: Ability to maintain adequate ventilation will improve Outcome: Progressing   Problem: Clinical Measurements: Goal: Will remain free from infection Outcome: Progressing   Problem: Coping: Goal: Level of anxiety will decrease Outcome: Progressing   Problem: Pain Managment: Goal: General experience of comfort will improve and/or be controlled Outcome: Progressing   Problem: Nutrition: Goal: Adequate nutrition will be maintained Outcome: Not Progressing

## 2024-03-20 NOTE — Discharge Summary (Signed)
 Physician Discharge Summary         Patient ID: Jared Gutierrez MRN: [id number] DOB/AGE: [birth year] 83 y.o.  Admit date: 03/14/2024 Discharge date: 03/20/2024  Discharge Diagnoses:    Active Hospital Problems   Diagnosis Date Noted   Obstructive jaundice (HCC) 03/04/2024   Hyperbilirubinemia 03/18/2024   Protein-calorie malnutrition, severe 03/17/2024   GERD without esophagitis 03/15/2024   BPH (benign prostatic hyperplasia) 03/28/2022   Hypokalemia 07/23/2019    Resolved Hospital Problems   Diagnosis Date Noted Date Resolved   Pancreatic cancer [facility]) 03/15/2024 03/18/2024      Discharge summary      Mr. Jared Gutierrez is an 83 y.o. 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 He has not had a bowel movement within the previous 3-4 days and now has developed worsening abdominal distention, nausea and vomiting. Patient had extensive evaluation by surgery, GI , IR, oncology and it appears his intra-abdominal process in no longer amenable to treatment and cancer is advanced and extensive with no additional therapy to offer. Patient family met with palliative care and decided to proceed with hospice.   Discharge Plan by Active Problems    Hospice   Discharge Exam: BP 133/71   Pulse 61   Temp (!) 97.3 F (36.3 C) (Oral)   Resp 11   Ht 5' 8 (1.727 m)   Wt 93.1 kg   SpO2 97%   BMI 31.21 kg/m   * Labs at discharge   Lab Results  Component Value Date   CREATININE 2.87 (H) 03/20/2024   BUN 60 (H) 03/20/2024   NA 138 03/20/2024   K 3.5 03/20/2024   CL 112 (H) 03/20/2024   CO2 13 (L) 03/20/2024   Lab Results  Component Value Date   WBC 15.1 (H) 03/20/2024   HGB 10.3 (L) 03/20/2024    HCT 29.2 (L) 03/20/2024   MCV 89.0 03/20/2024   PLT 139 (L) 03/20/2024   Lab Results  Component Value Date   ALT 58 (H) 03/20/2024   AST 103 (H) 03/20/2024   ALKPHOS 305 (H) 03/20/2024   BILITOT 15.4 (H) 03/20/2024   Lab Results  Component Value Date   INR 2.0 (H) 03/20/2024   INR 1.8 (H) 03/19/2024   INR 1.8 (H) 03/18/2024    Current radiological studies    No results found.  Disposition:    HOSPICE    Allergies as of 03/20/2024   No Known Allergies      Medication List     STOP taking these medications    cholestyramine 4 g packet Commonly known as: QUESTRAN   cyanocobalamin 500 MCG tablet Commonly known as: VITAMIN B12   feeding supplement Liqd   furosemide  20 MG tablet Commonly known as: LASIX    loratadine 10 MG tablet Commonly known as: CLARITIN   omeprazole  20 MG capsule Commonly known as: PRILOSEC   ondansetron  8 MG tablet Commonly known as: ZOFRAN    pantoprazole  40 MG tablet Commonly known as: PROTONIX    potassium chloride  SA 20 MEQ tablet Commonly known as: KLOR-CON  M   PROBIOTIC DAILY PO   tamsulosin  0.4 MG Caps capsule Commonly known as: FLOMAX          Discharge Condition:    critical  Physician Statement:  The Patient was personally examined, the discharge assessment and plan has been personally reviewed and I agree with ACNP Babcock's assessment and plan. 32 minutes of time have been dedicated to discharge assessment, planning and discharge instructions.   Signed: Krisandra Bueno 03/20/2024, 4:19 PM

## 2024-03-20 NOTE — Progress Notes (Signed)
 Rogelia Copping, MD [facility]   [address], [address] [place], [place] [address] Phone: [phone] Fax : [phone]   Subjective: The patient remains lethargic and does not answer questions.  The liver enzymes continue to remain elevated but the bilirubin has trended down.  The patient's INR did go up again today slightly.   Objective: Vital signs in last 24 hours: Vitals:   03/20/24 0800 03/20/24 0900 03/20/24 1000 03/20/24 1100  BP: 119/69 132/78 (!) 140/91 133/71  Pulse: (!) 57 70 (!) 55 61  Resp: 12 12 16 11   Temp: (!) 97.3 F (36.3 C)     TempSrc: Oral     SpO2: 96% 97% 93% 97%  Weight:      Height:       Weight change: -3.6 kg  Intake/Output Summary (Last 24 hours) at 03/20/2024 1320 Last data filed at 03/20/2024 9062 Gross per 24 hour  Intake 595.83 ml  Output 50 ml  Net 545.83 ml     Exam: General: Patient looks critically ill in the ICU and lethargic   Lab Results: @LABTEST2 @ Micro Results: Recent Results (from the past 240 hours)  Urine Culture (for pregnant, neutropenic or urologic patients or patients with an indwelling urinary catheter)     Status: None   Collection Time: 03/14/24  3:26 PM   Specimen: Urine, Catheterized  Result Value Ref Range Status   Specimen Description   Final    URINE, CATHETERIZED Performed at [facility], [address] [place], [place] [address]    Special Requests   Final    NONE Performed at [facility], [address], [place], [place] [address]    Culture   Final    NO GROWTH Performed at [facility], [address] [place], [place] [address]    Report Status 03/16/2024 FINAL  Final  MRSA Next Gen by PCR, Nasal     Status: None   Collection Time: 03/15/24  5:08 PM   Specimen: Nasal Mucosa; Nasal Swab  Result Value Ref Range Status   MRSA by PCR Next Gen NOT DETECTED NOT DETECTED Final    Comment: (NOTE) The GeneXpert MRSA Assay (FDA approved for NASAL specimens only), is one  component of a comprehensive MRSA colonization surveillance program. It is not intended to diagnose MRSA infection nor to guide or monitor treatment for MRSA infections. Test performance is not FDA approved in patients less than 8 years old. Performed at [facility], [address] [place], [place] [address]   Culture, blood (Routine X 2) w Reflex to ID Panel     Status: None (Preliminary result)   Collection Time: 03/16/24 12:14 PM   Specimen: BLOOD LEFT HAND  Result Value Ref Range Status   Specimen Description BLOOD LEFT HAND  Final   Special Requests   Final    BOTTLES DRAWN AEROBIC AND ANAEROBIC Blood Culture adequate volume   Culture   Final    NO GROWTH 4 DAYS Performed at [facility], [address] [place], [place] [address]    Report Status PENDING  Incomplete  Culture, blood (Routine X 2) w Reflex to ID Panel     Status: None (Preliminary result)   Collection Time: 03/16/24 12:21 PM   Specimen: BLOOD LEFT ARM  Result Value Ref Range Status   Specimen Description BLOOD LEFT ARM  Final   Special Requests   Final    BOTTLES DRAWN AEROBIC AND ANAEROBIC Blood Culture adequate  volume   Culture   Final    NO GROWTH 4 DAYS Performed at [facility], [address] [place], [place] [address]    Report Status PENDING  Incomplete   Studies/Results: No results found. Medications: I have reviewed the patient's current medications. Scheduled Meds:  antiseptic oral rinse  15 mL Topical TID   sodium chloride  flush  10-40 mL Intracatheter Q12H   Continuous Infusions: PRN Meds:.acetaminophen  **OR** acetaminophen , artificial tears, HYDROmorphone (DILAUDID) injection, magnesium  hydroxide, mineral oil-hydrophilic petrolatum, morphine  injection, ondansetron  **OR** ondansetron  (ZOFRAN ) IV, sodium chloride  flush, traZODone   Assessment: Principal Problem:   Obstructive jaundice (HCC) Active Problems:   Hypokalemia   BPH (benign prostatic  hyperplasia)   GERD without esophagitis   Protein-calorie malnutrition, severe   Hyperbilirubinemia    Plan: This patient is status post ERCP with stent exchange and his liver enzymes have trended down with the bilirubin AST and ALT improving.  The patient continues to be critically ill but is off pressors.  The INR is still elevated.  I have discussed the options with the family including putting a metal stent in the common bile duct but since the stent appears to be working at the present time I would not subject the patient to an ERCP in his frail state at this time.  The family has been explained the plan and agrees with it.   LOS: 6 days   Rogelia Copping, MD.FACG 03/20/2024, 1:20 PM Pager [phone] 7am-5pm  Check AMION for 5pm -7am coverage and on weekends

## 2024-03-20 NOTE — TOC Progression Note (Signed)
 Transition of Care [facility]) - Progression Note    Patient Details  Name: Jared Gutierrez MRN: [id number] Date of Birth: 04-15-1941  [facility] CM/SW Contact  K'La JINNY Ruts, LCSW Phone Number: 03/20/2024, 1:37 PM  Clinical Narrative:    Chart reviewed. I received a consult for hospice care. I have reached out to [facility] for assistance. Lonell confirmed patient can be D/C with hospice care.   There are no other TOC needs.      Barriers to Discharge: Continued Medical Work up               Expected Discharge Plan and Services       Living arrangements for the past 2 months: Single Family Home                                       Social Drivers of Health (SDOH) Interventions SDOH Screenings   Food Insecurity: Patient Unable To Answer (03/16/2024)  Housing: Patient Unable To Answer (03/16/2024)  Transportation Needs: Patient Unable To Answer (03/16/2024)  Utilities: Patient Unable To Answer (03/16/2024)  Social Connections: Patient Unable To Answer (03/16/2024)  Tobacco Use: Low Risk  (03/15/2024)    Readmission Risk Interventions    03/18/2024    2:14 PM 03/06/2024   10:55 AM  Readmission Risk Prevention Plan  Transportation Screening Complete Complete  PCP or Specialist Appt within 3-5 Days  Complete  HRI or Home Care Consult  --  Social Work Consult for Recovery Care Planning/Counseling  --  Palliative Care Screening  Not Applicable  Medication Review Oceanographer) Complete Complete  PCP or Specialist appointment within 3-5 days of discharge Complete   SW Recovery Care/Counseling Consult Complete   Palliative Care Screening Not Applicable   Skilled Nursing Facility Not Applicable

## 2024-03-21 LAB — CULTURE, BLOOD (ROUTINE X 2)
Culture: NO GROWTH
Culture: NO GROWTH
Special Requests: ADEQUATE
Special Requests: ADEQUATE

## 2024-04-02 ENCOUNTER — Inpatient Hospital Stay

## 2024-04-02 ENCOUNTER — Inpatient Hospital Stay: Admitting: Oncology

## 2024-04-14 DEATH — deceased

## 2024-04-23 ENCOUNTER — Encounter: Payer: Self-pay | Admitting: Gastroenterology

## 2024-04-23 ENCOUNTER — Encounter: Payer: Self-pay | Admitting: Oncology

## 2024-04-23 ENCOUNTER — Encounter: Payer: Self-pay | Admitting: Hematology and Oncology

## 2024-04-25 ENCOUNTER — Encounter: Payer: Self-pay | Admitting: Hematology and Oncology

## 2024-04-25 ENCOUNTER — Encounter: Payer: Self-pay | Admitting: Oncology
